# Patient Record
Sex: Male | Born: 1940 | Race: White | Hispanic: No | Marital: Single | Smoking: Former smoker
Health system: Southern US, Community
[De-identification: ages and names within clinical notes are randomized; demographics above are authoritative.]

## PROBLEM LIST (undated history)

## (undated) DIAGNOSIS — K7689 Other specified diseases of liver: Secondary | ICD-10-CM

## (undated) DIAGNOSIS — D126 Benign neoplasm of colon, unspecified: Secondary | ICD-10-CM

## (undated) DIAGNOSIS — C61 Malignant neoplasm of prostate: Secondary | ICD-10-CM

## (undated) DIAGNOSIS — Z8619 Personal history of other infectious and parasitic diseases: Secondary | ICD-10-CM

## (undated) DIAGNOSIS — K8681 Exocrine pancreatic insufficiency: Secondary | ICD-10-CM

## (undated) DIAGNOSIS — E785 Hyperlipidemia, unspecified: Secondary | ICD-10-CM

## (undated) DIAGNOSIS — I2510 Atherosclerotic heart disease of native coronary artery without angina pectoris: Secondary | ICD-10-CM

## (undated) DIAGNOSIS — K2270 Barrett's esophagus without dysplasia: Secondary | ICD-10-CM

## (undated) DIAGNOSIS — A048 Other specified bacterial intestinal infections: Secondary | ICD-10-CM

## (undated) DIAGNOSIS — R490 Dysphonia: Secondary | ICD-10-CM

## (undated) DIAGNOSIS — H25019 Cortical age-related cataract, unspecified eye: Secondary | ICD-10-CM

## (undated) DIAGNOSIS — I509 Heart failure, unspecified: Secondary | ICD-10-CM

## (undated) DIAGNOSIS — I480 Paroxysmal atrial fibrillation: Secondary | ICD-10-CM

## (undated) DIAGNOSIS — D649 Anemia, unspecified: Secondary | ICD-10-CM

## (undated) DIAGNOSIS — I714 Abdominal aortic aneurysm, without rupture: Secondary | ICD-10-CM

## (undated) DIAGNOSIS — I10 Essential (primary) hypertension: Secondary | ICD-10-CM

## (undated) HISTORY — DX: Exocrine pancreatic insufficiency: K86.81

## (undated) HISTORY — DX: Other specified diseases of liver: K76.89

## (undated) HISTORY — DX: Heart failure, unspecified: I50.9

## (undated) HISTORY — DX: Cortical age-related cataract, unspecified eye: H25.019

## (undated) HISTORY — PX: CATARACT EXTRACTION: SUR2

## (undated) HISTORY — DX: Malignant neoplasm of prostate: C61

## (undated) HISTORY — PX: ESOPHAGOGASTRODUODENOSCOPY: SHX1529

## (undated) HISTORY — DX: Abdominal aortic aneurysm, without rupture: I71.4

## (undated) HISTORY — PX: INSERTION OF ICD: SHX6689

## (undated) HISTORY — DX: Hyperlipidemia, unspecified: E78.5

## (undated) HISTORY — DX: Benign neoplasm of colon, unspecified: D12.6

## (undated) HISTORY — DX: Atherosclerotic heart disease of native coronary artery without angina pectoris: I25.10

## (undated) HISTORY — DX: Dysphonia: R49.0

## (undated) HISTORY — DX: Anemia, unspecified: D64.9

## (undated) HISTORY — DX: Other specified bacterial intestinal infections: A04.8

## (undated) HISTORY — PX: PROSTATE SURGERY: SHX751

## (undated) HISTORY — PX: COLONOSCOPY: SHX174

## (undated) HISTORY — PX: TONSILLECTOMY: SUR1361

## (undated) HISTORY — DX: Barrett's esophagus without dysplasia: K22.70

## (undated) HISTORY — DX: Personal history of other infectious and parasitic diseases: Z86.19

---

## 1990-08-26 HISTORY — PX: FLEXIBLE SIGMOIDOSCOPY: SHX1649

## 2004-09-18 ENCOUNTER — Ambulatory Visit: Payer: Self-pay | Admitting: Unknown Physician Specialty

## 2008-10-16 ENCOUNTER — Inpatient Hospital Stay: Payer: Self-pay | Admitting: *Deleted

## 2009-03-14 ENCOUNTER — Ambulatory Visit: Payer: Self-pay | Admitting: Gastroenterology

## 2014-10-06 ENCOUNTER — Ambulatory Visit
Admit: 2014-10-06 | Disposition: A | Discharge status: Home / Self Care | Payer: Self-pay | Attending: Unknown Physician Specialty | Admitting: Unknown Physician Specialty

## 2014-10-10 LAB — SURGICAL PATHOLOGY

## 2015-05-01 ENCOUNTER — Other Ambulatory Visit: Payer: Self-pay | Admitting: Physician Assistant

## 2015-05-01 DIAGNOSIS — R194 Change in bowel habit: Secondary | ICD-10-CM

## 2015-05-05 ENCOUNTER — Other Ambulatory Visit: Payer: Self-pay | Admitting: Physician Assistant

## 2015-05-05 DIAGNOSIS — K8681 Exocrine pancreatic insufficiency: Secondary | ICD-10-CM

## 2015-05-09 ENCOUNTER — Ambulatory Visit
Admission: RE | Admit: 2015-05-09 | Discharge: 2015-05-09 | Disposition: A | Discharge status: Home / Self Care | Payer: Medicare Other | Source: Ambulatory Visit | Attending: Physician Assistant | Admitting: Physician Assistant

## 2015-05-09 ENCOUNTER — Ambulatory Visit: Payer: Medicare Other

## 2015-05-09 DIAGNOSIS — K8681 Exocrine pancreatic insufficiency: Secondary | ICD-10-CM

## 2015-05-10 ENCOUNTER — Other Ambulatory Visit: Payer: Self-pay | Admitting: Physician Assistant

## 2015-05-10 DIAGNOSIS — K8681 Exocrine pancreatic insufficiency: Secondary | ICD-10-CM

## 2015-05-13 DIAGNOSIS — I714 Abdominal aortic aneurysm, without rupture, unspecified: Secondary | ICD-10-CM

## 2015-05-13 HISTORY — DX: Abdominal aortic aneurysm, without rupture: I71.4

## 2015-05-13 HISTORY — DX: Abdominal aortic aneurysm, without rupture, unspecified: I71.40

## 2015-05-16 ENCOUNTER — Ambulatory Visit
Admission: RE | Admit: 2015-05-16 | Discharge: 2015-05-16 | Disposition: A | Discharge status: Home / Self Care | Payer: Medicare Other | Source: Ambulatory Visit | Attending: Physician Assistant | Admitting: Physician Assistant

## 2015-05-16 DIAGNOSIS — K8681 Exocrine pancreatic insufficiency: Secondary | ICD-10-CM | POA: Insufficient documentation

## 2015-05-16 DIAGNOSIS — K769 Liver disease, unspecified: Secondary | ICD-10-CM | POA: Diagnosis not present

## 2015-05-16 DIAGNOSIS — I714 Abdominal aortic aneurysm, without rupture: Secondary | ICD-10-CM | POA: Diagnosis not present

## 2015-05-16 DIAGNOSIS — K7689 Other specified diseases of liver: Secondary | ICD-10-CM

## 2015-05-16 HISTORY — DX: Other specified diseases of liver: K76.89

## 2015-05-16 HISTORY — DX: Essential (primary) hypertension: I10

## 2015-05-16 MED ORDER — IOHEXOL 350 MG/ML SOLN
100.0000 mL | Freq: Once | INTRAVENOUS | Status: AC | PRN
  Administered 2015-05-16: 80 mL via INTRAVENOUS

## 2015-08-08 ENCOUNTER — Encounter: Payer: Self-pay | Admitting: *Deleted

## 2015-08-08 ENCOUNTER — Inpatient Hospital Stay: Payer: Medicare Other | Attending: Internal Medicine | Admitting: Internal Medicine

## 2015-08-08 ENCOUNTER — Inpatient Hospital Stay: Payer: Medicare Other

## 2015-08-08 VITALS — BP 151/72 | HR 51 | Temp 96.0°F | Resp 19 | Wt 176.4 lb

## 2015-08-08 DIAGNOSIS — Z7982 Long term (current) use of aspirin: Secondary | ICD-10-CM | POA: Insufficient documentation

## 2015-08-08 DIAGNOSIS — Z8546 Personal history of malignant neoplasm of prostate: Secondary | ICD-10-CM | POA: Diagnosis not present

## 2015-08-08 DIAGNOSIS — Z79899 Other long term (current) drug therapy: Secondary | ICD-10-CM | POA: Diagnosis not present

## 2015-08-08 DIAGNOSIS — I2510 Atherosclerotic heart disease of native coronary artery without angina pectoris: Secondary | ICD-10-CM

## 2015-08-08 DIAGNOSIS — F17210 Nicotine dependence, cigarettes, uncomplicated: Secondary | ICD-10-CM | POA: Insufficient documentation

## 2015-08-08 DIAGNOSIS — E785 Hyperlipidemia, unspecified: Secondary | ICD-10-CM

## 2015-08-08 DIAGNOSIS — K2270 Barrett's esophagus without dysplasia: Secondary | ICD-10-CM | POA: Diagnosis not present

## 2015-08-08 DIAGNOSIS — Z86010 Personal history of colonic polyps: Secondary | ICD-10-CM | POA: Insufficient documentation

## 2015-08-08 DIAGNOSIS — K8681 Exocrine pancreatic insufficiency: Secondary | ICD-10-CM | POA: Insufficient documentation

## 2015-08-08 DIAGNOSIS — I509 Heart failure, unspecified: Secondary | ICD-10-CM | POA: Diagnosis not present

## 2015-08-08 DIAGNOSIS — D649 Anemia, unspecified: Secondary | ICD-10-CM

## 2015-08-08 DIAGNOSIS — I10 Essential (primary) hypertension: Secondary | ICD-10-CM | POA: Diagnosis not present

## 2015-08-08 DIAGNOSIS — R490 Dysphonia: Secondary | ICD-10-CM | POA: Diagnosis not present

## 2015-08-08 DIAGNOSIS — I714 Abdominal aortic aneurysm, without rupture: Secondary | ICD-10-CM | POA: Insufficient documentation

## 2015-08-08 LAB — COMPREHENSIVE METABOLIC PANEL
ALT: 27 U/L (ref 17–63)
AST: 24 U/L (ref 15–41)
Albumin: 4.6 g/dL (ref 3.5–5.0)
Alkaline Phosphatase: 67 U/L (ref 38–126)
Anion gap: 6 (ref 5–15)
BILIRUBIN TOTAL: 0.5 mg/dL (ref 0.3–1.2)
BUN: 19 mg/dL (ref 6–20)
CO2: 28 mmol/L (ref 22–32)
Calcium: 9.2 mg/dL (ref 8.9–10.3)
Chloride: 105 mmol/L (ref 101–111)
Creatinine, Ser: 1.98 mg/dL — ABNORMAL HIGH (ref 0.61–1.24)
GFR calc Af Amer: 37 mL/min — ABNORMAL LOW (ref >60)
GFR, EST NON AFRICAN AMERICAN: 32 mL/min — AB (ref >60)
Glucose, Bld: 99 mg/dL (ref 65–99)
Potassium: 4.1 mmol/L (ref 3.5–5.1)
Sodium: 139 mmol/L (ref 135–145)
TOTAL PROTEIN: 7.1 g/dL (ref 6.5–8.1)

## 2015-08-08 LAB — CBC WITH DIFFERENTIAL/PLATELET
Basophils Absolute: 0 10*3/uL (ref 0–0.1)
Basophils Relative: 1 %
Eosinophils Absolute: 0.2 10*3/uL (ref 0–0.7)
Eosinophils Relative: 3 %
HEMATOCRIT: 34.6 % — AB (ref 40.0–52.0)
Hemoglobin: 11.7 g/dL — ABNORMAL LOW (ref 13.0–18.0)
LYMPHS ABS: 1.4 10*3/uL (ref 1.0–3.6)
Lymphocytes Relative: 24 %
MCH: 29.3 pg (ref 26.0–34.0)
MCHC: 33.7 g/dL (ref 32.0–36.0)
MCV: 87 fL (ref 80.0–100.0)
MONO ABS: 0.5 10*3/uL (ref 0.2–1.0)
Neutro Abs: 3.6 10*3/uL (ref 1.4–6.5)
Neutrophils Relative %: 64 %
Platelets: 159 10*3/uL (ref 150–440)
RBC: 3.97 MIL/uL — ABNORMAL LOW (ref 4.40–5.90)
RDW: 13.4 % (ref 11.5–14.5)
WBC: 5.7 10*3/uL (ref 3.8–10.6)

## 2015-08-08 LAB — RETICULOCYTES
RBC.: 3.96 MIL/uL — ABNORMAL LOW (ref 4.40–5.90)
RETIC COUNT ABSOLUTE: 47.5 10*3/uL (ref 19.0–183.0)
Retic Ct Pct: 1.2 % (ref 0.4–3.1)

## 2015-08-08 LAB — FERRITIN: Ferritin: 60 ng/mL (ref 24–336)

## 2015-08-08 LAB — IRON AND TIBC
IRON: 91 ug/dL (ref 45–182)
Saturation Ratios: 28 % (ref 17.9–39.5)
TIBC: 326 ug/dL (ref 250–450)
UIBC: 235 ug/dL

## 2015-08-08 LAB — LACTATE DEHYDROGENASE: LDH: 255 U/L — ABNORMAL HIGH (ref 98–192)

## 2015-08-08 NOTE — Progress Notes (Signed)
New eval of mild anemia. Feels well. Denies blood in stool. Last colonoscopy in 2016. Hx cardiomyopathy, Defib placement 2009. Hx Prostate Ca with protectomy in 1996. Reports energy is good. Appetite is good.

## 2015-08-08 NOTE — Progress Notes (Signed)
[facility] CONSULT NOTE  Patient Care Team: Maryland Pink, MD as PCP - General (Family Medicine)  CHIEF COMPLAINTS/PURPOSE OF CONSULTATION: Anemia  # NOV 2016 Mild Anemia ~11.9  # hx of Prostate cancer s/p Prostatectomy [1996]  HISTORY OF PRESENTING ILLNESS:  Matthew Zamora 75 y.o.  male pleasant patient has been referred to [place] for further evaluation of his mild anemia. On the review of CBC from November 2016 patient's hemoglobin was 11.9; normocytic. White count and platelets were normal.  Patient denies any unusual fatigue. Denies any shortness of breath or chest pain. Patient denies any blood in stools black stools. Patient had a EGD colonoscopy in April 2016.   Patient denies any unusual weight loss or night sweats or loss of appetite.  ROS: A complete 10 point review of system is done which is negative except mentioned above in history of present illness  MEDICAL HISTORY:  Past Medical History  Diagnosis Date  . Prostate CA ([place])   . Hypertension   . Anemia   . 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 Chronic hoarseness   . Cataract cortical, senile   . CAD (coronary artery disease)   . CHF (congestive heart failure) ([place])   . H. pylori infection   . History of hepatitis   . Hyperlipidemia   . 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 Abdominal aortic aneurysm (AAA) ([place]) 05/13/15    seen on ct scan    SURGICAL HISTORY: Past Surgical History  Procedure Laterality Date  . Prostate surgery    . Insertion of icd    . Cataract extraction    . Tonsillectomy    . Colonoscopy  10/06/2014, 09/18/2004, 03/14/2009  . Flexible sigmoidoscopy  08/26/1990  . Esophagogastroduodenoscopy  10/06/2014, 03/18/2001, 03/14/2009    SOCIAL HISTORY: Social History   Social History  . Marital Status: Single    Spouse Name: N/A  . Number of Children: N/A  . Years of Education: N/A    Occupational History  . Not on file.   Social History Main Topics  . Smoking status: Former Smoker -- 1.00 packs/day for 38 years    Types: Cigarettes    Quit date: 06/18/1999  . Smokeless tobacco: Never Used  . Alcohol Use: No  . Drug Use: Not on file  . Sexual Activity: Not on file   Other Topics Concern  . Not on file   Social History Narrative    FAMILY HISTORY: Family History  Problem Relation Age of Onset  . Heart attack Mother   . Heart attack Father     ALLERGIES:  has no allergies on file.  MEDICATIONS:  Current Outpatient Prescriptions  Medication Sig Dispense Refill  . aspirin EC 81 MG tablet Take 81 mg by mouth daily.    [name] atorvastatin (LIPITOR) 40 MG tablet Take 40 mg by mouth daily.    . furosemide (LASIX) 40 MG tablet Take 20 mg by mouth daily.    . lipase/protease/amylase (CREON) 12000 units CPEP capsule Take 24,000 Units by mouth 2 (two) times daily at 10 am and 4 pm.    . lisinopril (PRINIVIL,ZESTRIL) 5 MG tablet Take 5 mg by mouth daily.    . magnesium (MAGTAB) 84 MG (7MEQ) TBCR SR tablet Take 84 mg by mouth daily.    . metoprolol succinate (TOPROL-XL) 100 MG 24 hr tablet Take 100 mg by mouth daily. Take with or immediately following  a meal.    . Multiple Vitamins-Minerals (MULTIVITAMIN WITH MINERALS) tablet Take 1 tablet by mouth daily.    [name] omeprazole (PRILOSEC) 20 MG capsule Take 20 mg by mouth daily.    [name] oxybutynin (DITROPAN XL) 15 MG 24 hr tablet Take 15 mg by mouth at bedtime.     No current facility-administered medications for this visit.      [name]  PHYSICAL EXAMINATION:   Filed Vitals:   08/08/15 1021  BP: 151/72  Pulse: 51  Temp: 96 F (35.6 C)  Resp: 19   Filed Weights   08/08/15 1021  Weight: 176 lb 5.9 oz (80 kg)    GENERAL: Well-nourished well-developed; Alert, no distress and comfortable.  Alone.  EYES: no pallor or icterus OROPHARYNX: no thrush or ulceration; dentures.   NECK: supple, no masses felt LYMPH:  no  palpable lymphadenopathy in the cervical, axillary or inguinal regions LUNGS: clear to auscultation and  No wheeze or crackles HEART/CVS: regular rate & rhythm and no murmurs; No lower extremity edema ABDOMEN: abdomen soft, non-tender and normal bowel sounds Musculoskeletal:no cyanosis of digits and no clubbing  PSYCH: alert & oriented x 3 with fluent speech NEURO: no focal motor/sensory deficits SKIN:  no rashes or significant lesions   ASSESSMENT & PLAN:   # Anemia- normocytic; 11.9- November 2016. Patient is asymptomatic at this time. The reason for the patient's mild anemia is unclear. I would recommend checking a CBC CMP and LDH and haptoglobin iron studies; monoclonal workup.  # The above plan of care was discussed with the patient he agrees. He'll follow-up with me in approximately 2 weeks/to review the above blood work-results.  Thank you Dr. Gorden Harms for allowing me to participate in the care of your pleasant patient. Please do not hesitate to contact me with questions or concerns in the interim.  # 30 minutes face-to-face with the patient discussing the above plan of care; more than 50% of time spent on counseling and coordination.     Cammie Sickle, MD 08/08/2015 10:26 AM

## 2015-08-09 LAB — IMMUNOFIXATION ELECTROPHORESIS
IGM, SERUM: 108 mg/dL (ref 15–143)
IgA: 80 mg/dL (ref 61–437)
IgG (Immunoglobin G), Serum: 800 mg/dL (ref 700–1600)
Total Protein ELP: 6.7 g/dL (ref 6.0–8.5)

## 2015-08-09 LAB — KAPPA/LAMBDA LIGHT CHAINS
KAPPA FREE LGHT CHN: 32.2 mg/L — AB (ref 3.30–19.40)
Kappa, lambda light chain ratio: 2.01 — ABNORMAL HIGH (ref 0.26–1.65)
LAMDA FREE LIGHT CHAINS: 16.05 mg/L (ref 5.71–26.30)

## 2015-08-09 LAB — SOLUBLE TRANSFERRIN RECEPTOR: Transferrin Receptor: 16.3 nmol/L (ref 12.2–27.3)

## 2015-08-09 LAB — HAPTOGLOBIN: Haptoglobin: 82 mg/dL (ref 34–200)

## 2015-08-22 ENCOUNTER — Inpatient Hospital Stay: Payer: Medicare Other | Attending: Internal Medicine | Admitting: Internal Medicine

## 2015-08-22 VITALS — BP 153/75 | Temp 96.8°F | Resp 19 | Wt 178.6 lb

## 2015-08-22 DIAGNOSIS — I2510 Atherosclerotic heart disease of native coronary artery without angina pectoris: Secondary | ICD-10-CM

## 2015-08-22 DIAGNOSIS — K8681 Exocrine pancreatic insufficiency: Secondary | ICD-10-CM | POA: Diagnosis not present

## 2015-08-22 DIAGNOSIS — E785 Hyperlipidemia, unspecified: Secondary | ICD-10-CM | POA: Insufficient documentation

## 2015-08-22 DIAGNOSIS — R490 Dysphonia: Secondary | ICD-10-CM | POA: Insufficient documentation

## 2015-08-22 DIAGNOSIS — D649 Anemia, unspecified: Secondary | ICD-10-CM | POA: Diagnosis not present

## 2015-08-22 DIAGNOSIS — Z79899 Other long term (current) drug therapy: Secondary | ICD-10-CM | POA: Insufficient documentation

## 2015-08-22 DIAGNOSIS — K2270 Barrett's esophagus without dysplasia: Secondary | ICD-10-CM

## 2015-08-22 DIAGNOSIS — I129 Hypertensive chronic kidney disease with stage 1 through stage 4 chronic kidney disease, or unspecified chronic kidney disease: Secondary | ICD-10-CM | POA: Diagnosis not present

## 2015-08-22 DIAGNOSIS — Z8619 Personal history of other infectious and parasitic diseases: Secondary | ICD-10-CM | POA: Diagnosis not present

## 2015-08-22 DIAGNOSIS — K769 Liver disease, unspecified: Secondary | ICD-10-CM | POA: Diagnosis not present

## 2015-08-22 DIAGNOSIS — I714 Abdominal aortic aneurysm, without rupture: Secondary | ICD-10-CM | POA: Insufficient documentation

## 2015-08-22 DIAGNOSIS — Z86010 Personal history of colonic polyps: Secondary | ICD-10-CM | POA: Diagnosis not present

## 2015-08-22 DIAGNOSIS — C61 Malignant neoplasm of prostate: Secondary | ICD-10-CM

## 2015-08-22 DIAGNOSIS — Z7982 Long term (current) use of aspirin: Secondary | ICD-10-CM | POA: Insufficient documentation

## 2015-08-22 DIAGNOSIS — N189 Chronic kidney disease, unspecified: Secondary | ICD-10-CM | POA: Diagnosis not present

## 2015-08-22 DIAGNOSIS — I509 Heart failure, unspecified: Secondary | ICD-10-CM | POA: Insufficient documentation

## 2015-08-22 DIAGNOSIS — D6489 Other specified anemias: Secondary | ICD-10-CM

## 2015-08-22 DIAGNOSIS — Z87891 Personal history of nicotine dependence: Secondary | ICD-10-CM | POA: Insufficient documentation

## 2015-08-22 NOTE — Progress Notes (Signed)
[facility] NOTE  Patient Care Team: Maryland Pink, MD as PCP - General (Family Medicine)  CHIEF COMPLAINTS/PURPOSE OF CONSULTATION: Anemia  # NOV 2016 Mild Anemia ~11.9 [Ferritin-60/ % sat- 28; transferin-N; SIEP- NEG; K/L= 2; EGD/colo- NEG April 2016].   # hx of Prostate cancer s/p Prostatectomy [1996]  HISTORY OF PRESENTING ILLNESS:  Matthew Zamora 75 y.o.  male pleasant patient  Is here to review the results of this blood work for his anemia.  Denies any unusual fatigue.  On further question today he admits to taking about 3  Advil  At night;  To help sleep.  Patient denies any unusual weight loss or night sweats or loss of appetite.  ROS: A complete 10 point review of system is done which is negative except mentioned above in history of present illness  MEDICAL HISTORY:  Past Medical History  Diagnosis Date  . Prostate CA ([place])   . Hypertension   . Anemia   . 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 Chronic hoarseness   . Cataract cortical, senile   . CAD (coronary artery disease)   . CHF (congestive heart failure) ([place])   . H. pylori infection   . History of hepatitis   . Hyperlipidemia   . 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 Abdominal aortic aneurysm (AAA) ([place]) 05/13/15    seen on ct scan    SURGICAL HISTORY: Past Surgical History  Procedure Laterality Date  . Prostate surgery    . Insertion of icd    . Cataract extraction    . Tonsillectomy    . Colonoscopy  10/06/2014, 09/18/2004, 03/14/2009  . Flexible sigmoidoscopy  08/26/1990  . Esophagogastroduodenoscopy  10/06/2014, 03/18/2001, 03/14/2009    SOCIAL HISTORY: Social History   Social History  . Marital Status: Single    Spouse Name: N/A  . Number of Children: N/A  . Years of Education: N/A   Occupational History  . Not on file.   Social History Main Topics  . Smoking status: Former  Smoker -- 1.00 packs/day for 38 years    Types: Cigarettes    Quit date: 06/18/1999  . Smokeless tobacco: Never Used  . Alcohol Use: No  . Drug Use: Not on file  . Sexual Activity: Not on file   Other Topics Concern  . Not on file   Social History Narrative    FAMILY HISTORY: Family History  Problem Relation Age of Onset  . Heart attack Mother   . Heart attack Father     ALLERGIES:  has no allergies on file.  MEDICATIONS:  Current Outpatient Prescriptions  Medication Sig Dispense Refill  . aspirin EC 81 MG tablet Take 81 mg by mouth daily.    [name] atorvastatin (LIPITOR) 40 MG tablet Take 40 mg by mouth daily.    . furosemide (LASIX) 40 MG tablet Take 20 mg by mouth daily.    . lipase/protease/amylase (CREON) 12000 units CPEP capsule Take 24,000 Units by mouth 2 (two) times daily at 10 am and 4 pm.    . lisinopril (PRINIVIL,ZESTRIL) 5 MG tablet Take 5 mg by mouth daily.    . magnesium (MAGTAB) 84 MG (7MEQ) TBCR SR tablet Take 84 mg by mouth daily.    . metoprolol succinate (TOPROL-XL) 100 MG 24 hr tablet Take 100 mg by mouth daily. Take with or immediately following a meal.    . Multiple Vitamins-Minerals (MULTIVITAMIN  WITH MINERALS) tablet Take 1 tablet by mouth daily.    [name] omeprazole (PRILOSEC) 20 MG capsule Take 20 mg by mouth daily.    [name] oxybutynin (DITROPAN XL) 15 MG 24 hr tablet Take 15 mg by mouth at bedtime.     No current facility-administered medications for this visit.      [name]  PHYSICAL EXAMINATION:   Filed Vitals:   08/22/15 0921  BP: 153/75  Temp: 96.8 F (36 C)  Resp: 19   Filed Weights   08/22/15 0921  Weight: 178 lb 9.2 oz (81 kg)    GENERAL: Well-nourished well-developed; Alert, no distress and comfortable.  Alone.    ASSESSMENT & PLAN:   # Anemia- normocytic; 11.9- November 2016. Patient is asymptomatic at this time. anemia  Question related to chronic renal insufficiency.   Blood work not suggestive of iron deficiency; no hemolysis [ mild   LDH elevation;  Haptoglobin normal].  Monoclonal workup negative- except for slightly abnormal Kappa lambda light chain ratio [~2- likely from CKD] Would not recommend a bone marrow biopsy at this time.  Monitor for now.  #  Chronic kidney disease-  Creatinine  1.98;  Question related to chronic NSAID use.  Recommend stopping OF NSAIDs.  Recommended talks to  PCP regarding evaluation with nephrology.   Written instructions given.   #  History of prostate cancer status post prostatectomy-[ 3295]- check PSA at next visit.   #  Patient will follow-up with me in approximately 6 months/ labs/ PSA.  # 15 minutes face-to-face with the patient discussing the above plan of care; more than 50% of time spent on counseling and coordination.     Cammie Sickle, MD 08/22/2015 9:31 AM

## 2015-08-22 NOTE — Progress Notes (Signed)
Pt here to F/U on initial blood work up for Anemia. He is irate over bill that he received from our office. He states this is his last vist here , he will be taking his care elsewhere. Denies dizziness. States he feels good. Has exertional dyspnea from his cardiac issues.2009 Defib placed. No edema.Pt walks every day for exercise. Has chronic spurs in his back that rarely bothers him.

## 2016-02-27 ENCOUNTER — Inpatient Hospital Stay: Payer: Medicare Other | Attending: Family Medicine

## 2016-02-27 ENCOUNTER — Inpatient Hospital Stay: Payer: Medicare Other | Admitting: Internal Medicine

## 2018-04-26 ENCOUNTER — Emergency Department: Payer: Medicare Other

## 2018-04-26 ENCOUNTER — Inpatient Hospital Stay: Payer: Self-pay

## 2018-04-26 ENCOUNTER — Inpatient Hospital Stay (HOSPITAL_COMMUNITY)
Admit: 2018-04-26 | Discharge: 2018-04-26 | Disposition: A | Discharge status: Home / Self Care | Payer: Medicare Other | Attending: Internal Medicine | Admitting: Internal Medicine

## 2018-04-26 ENCOUNTER — Inpatient Hospital Stay
Admission: EM | Admit: 2018-04-26 | Discharge: 2018-05-13 | DRG: 870 | Disposition: A | Discharge status: Skilled Nursing Facility | Payer: Medicare Other | Attending: Internal Medicine | Admitting: Internal Medicine

## 2018-04-26 ENCOUNTER — Encounter: Payer: Self-pay | Admitting: *Deleted

## 2018-04-26 ENCOUNTER — Other Ambulatory Visit: Payer: Self-pay

## 2018-04-26 DIAGNOSIS — Z9849 Cataract extraction status, unspecified eye: Secondary | ICD-10-CM

## 2018-04-26 DIAGNOSIS — E785 Hyperlipidemia, unspecified: Secondary | ICD-10-CM | POA: Diagnosis present

## 2018-04-26 DIAGNOSIS — I2510 Atherosclerotic heart disease of native coronary artery without angina pectoris: Secondary | ICD-10-CM | POA: Diagnosis present

## 2018-04-26 DIAGNOSIS — I5043 Acute on chronic combined systolic (congestive) and diastolic (congestive) heart failure: Secondary | ICD-10-CM | POA: Diagnosis present

## 2018-04-26 DIAGNOSIS — G9340 Encephalopathy, unspecified: Secondary | ICD-10-CM | POA: Diagnosis not present

## 2018-04-26 DIAGNOSIS — R140 Abdominal distension (gaseous): Secondary | ICD-10-CM

## 2018-04-26 DIAGNOSIS — R6521 Severe sepsis with septic shock: Secondary | ICD-10-CM | POA: Diagnosis present

## 2018-04-26 DIAGNOSIS — Z79899 Other long term (current) drug therapy: Secondary | ICD-10-CM | POA: Diagnosis not present

## 2018-04-26 DIAGNOSIS — E1122 Type 2 diabetes mellitus with diabetic chronic kidney disease: Secondary | ICD-10-CM | POA: Diagnosis present

## 2018-04-26 DIAGNOSIS — Z0189 Encounter for other specified special examinations: Secondary | ICD-10-CM

## 2018-04-26 DIAGNOSIS — D631 Anemia in chronic kidney disease: Secondary | ICD-10-CM | POA: Diagnosis present

## 2018-04-26 DIAGNOSIS — Z01818 Encounter for other preprocedural examination: Secondary | ICD-10-CM

## 2018-04-26 DIAGNOSIS — G9341 Metabolic encephalopathy: Secondary | ICD-10-CM | POA: Diagnosis not present

## 2018-04-26 DIAGNOSIS — E876 Hypokalemia: Secondary | ICD-10-CM | POA: Diagnosis not present

## 2018-04-26 DIAGNOSIS — D649 Anemia, unspecified: Secondary | ICD-10-CM | POA: Diagnosis not present

## 2018-04-26 DIAGNOSIS — I5021 Acute systolic (congestive) heart failure: Secondary | ICD-10-CM | POA: Diagnosis not present

## 2018-04-26 DIAGNOSIS — I248 Other forms of acute ischemic heart disease: Secondary | ICD-10-CM | POA: Diagnosis not present

## 2018-04-26 DIAGNOSIS — A419 Sepsis, unspecified organism: Secondary | ICD-10-CM | POA: Diagnosis present

## 2018-04-26 DIAGNOSIS — R0602 Shortness of breath: Secondary | ICD-10-CM

## 2018-04-26 DIAGNOSIS — J9621 Acute and chronic respiratory failure with hypoxia: Secondary | ICD-10-CM | POA: Diagnosis present

## 2018-04-26 DIAGNOSIS — N179 Acute kidney failure, unspecified: Secondary | ICD-10-CM | POA: Diagnosis not present

## 2018-04-26 DIAGNOSIS — I130 Hypertensive heart and chronic kidney disease with heart failure and stage 1 through stage 4 chronic kidney disease, or unspecified chronic kidney disease: Secondary | ICD-10-CM | POA: Diagnosis present

## 2018-04-26 DIAGNOSIS — Z7982 Long term (current) use of aspirin: Secondary | ICD-10-CM

## 2018-04-26 DIAGNOSIS — Z7902 Long term (current) use of antithrombotics/antiplatelets: Secondary | ICD-10-CM

## 2018-04-26 DIAGNOSIS — N183 Chronic kidney disease, stage 3 (moderate): Secondary | ICD-10-CM | POA: Diagnosis present

## 2018-04-26 DIAGNOSIS — Z8546 Personal history of malignant neoplasm of prostate: Secondary | ICD-10-CM | POA: Diagnosis not present

## 2018-04-26 DIAGNOSIS — N189 Chronic kidney disease, unspecified: Secondary | ICD-10-CM | POA: Diagnosis not present

## 2018-04-26 DIAGNOSIS — Z7189 Other specified counseling: Secondary | ICD-10-CM | POA: Diagnosis not present

## 2018-04-26 DIAGNOSIS — Z9911 Dependence on respirator [ventilator] status: Secondary | ICD-10-CM | POA: Diagnosis not present

## 2018-04-26 DIAGNOSIS — J9600 Acute respiratory failure, unspecified whether with hypoxia or hypercapnia: Secondary | ICD-10-CM | POA: Diagnosis present

## 2018-04-26 DIAGNOSIS — Z95810 Presence of automatic (implantable) cardiac defibrillator: Secondary | ICD-10-CM

## 2018-04-26 DIAGNOSIS — Z955 Presence of coronary angioplasty implant and graft: Secondary | ICD-10-CM

## 2018-04-26 DIAGNOSIS — I5022 Chronic systolic (congestive) heart failure: Secondary | ICD-10-CM | POA: Diagnosis not present

## 2018-04-26 DIAGNOSIS — K8681 Exocrine pancreatic insufficiency: Secondary | ICD-10-CM | POA: Diagnosis present

## 2018-04-26 DIAGNOSIS — J189 Pneumonia, unspecified organism: Secondary | ICD-10-CM | POA: Diagnosis present

## 2018-04-26 DIAGNOSIS — I480 Paroxysmal atrial fibrillation: Secondary | ICD-10-CM | POA: Diagnosis not present

## 2018-04-26 DIAGNOSIS — R579 Shock, unspecified: Secondary | ICD-10-CM | POA: Diagnosis not present

## 2018-04-26 DIAGNOSIS — R0603 Acute respiratory distress: Secondary | ICD-10-CM | POA: Diagnosis present

## 2018-04-26 DIAGNOSIS — J810 Acute pulmonary edema: Secondary | ICD-10-CM | POA: Diagnosis not present

## 2018-04-26 DIAGNOSIS — I340 Nonrheumatic mitral (valve) insufficiency: Secondary | ICD-10-CM | POA: Diagnosis not present

## 2018-04-26 DIAGNOSIS — I5023 Acute on chronic systolic (congestive) heart failure: Secondary | ICD-10-CM | POA: Diagnosis not present

## 2018-04-26 DIAGNOSIS — N2581 Secondary hyperparathyroidism of renal origin: Secondary | ICD-10-CM | POA: Diagnosis present

## 2018-04-26 DIAGNOSIS — I714 Abdominal aortic aneurysm, without rupture: Secondary | ICD-10-CM | POA: Diagnosis present

## 2018-04-26 DIAGNOSIS — Z87891 Personal history of nicotine dependence: Secondary | ICD-10-CM

## 2018-04-26 DIAGNOSIS — I2541 Coronary artery aneurysm: Secondary | ICD-10-CM | POA: Diagnosis present

## 2018-04-26 DIAGNOSIS — K449 Diaphragmatic hernia without obstruction or gangrene: Secondary | ICD-10-CM | POA: Diagnosis present

## 2018-04-26 DIAGNOSIS — J9601 Acute respiratory failure with hypoxia: Secondary | ICD-10-CM | POA: Diagnosis present

## 2018-04-26 DIAGNOSIS — Z8249 Family history of ischemic heart disease and other diseases of the circulatory system: Secondary | ICD-10-CM

## 2018-04-26 DIAGNOSIS — N170 Acute kidney failure with tubular necrosis: Secondary | ICD-10-CM | POA: Diagnosis present

## 2018-04-26 DIAGNOSIS — Z86010 Personal history of colonic polyps: Secondary | ICD-10-CM | POA: Diagnosis not present

## 2018-04-26 DIAGNOSIS — R680 Hypothermia, not associated with low environmental temperature: Secondary | ICD-10-CM | POA: Diagnosis present

## 2018-04-26 DIAGNOSIS — F40240 Claustrophobia: Secondary | ICD-10-CM | POA: Diagnosis present

## 2018-04-26 DIAGNOSIS — E874 Mixed disorder of acid-base balance: Secondary | ICD-10-CM | POA: Diagnosis present

## 2018-04-26 DIAGNOSIS — Z8719 Personal history of other diseases of the digestive system: Secondary | ICD-10-CM

## 2018-04-26 DIAGNOSIS — J9602 Acute respiratory failure with hypercapnia: Secondary | ICD-10-CM | POA: Diagnosis present

## 2018-04-26 DIAGNOSIS — R4182 Altered mental status, unspecified: Secondary | ICD-10-CM | POA: Diagnosis not present

## 2018-04-26 DIAGNOSIS — J9690 Respiratory failure, unspecified, unspecified whether with hypoxia or hypercapnia: Secondary | ICD-10-CM

## 2018-04-26 DIAGNOSIS — D638 Anemia in other chronic diseases classified elsewhere: Secondary | ICD-10-CM

## 2018-04-26 DIAGNOSIS — J441 Chronic obstructive pulmonary disease with (acute) exacerbation: Secondary | ICD-10-CM | POA: Diagnosis not present

## 2018-04-26 DIAGNOSIS — Z515 Encounter for palliative care: Secondary | ICD-10-CM | POA: Diagnosis not present

## 2018-04-26 LAB — URINALYSIS, COMPLETE (UACMP) WITH MICROSCOPIC
BILIRUBIN URINE: NEGATIVE
Bacteria, UA: NONE SEEN
Glucose, UA: NEGATIVE mg/dL
HGB URINE DIPSTICK: NEGATIVE
KETONES UR: NEGATIVE mg/dL
LEUKOCYTES UA: NEGATIVE
NITRITE: NEGATIVE
Protein, ur: NEGATIVE mg/dL
Specific Gravity, Urine: 1.008 (ref 1.005–1.030)
Squamous Epithelial / LPF: NONE SEEN (ref 0–5)
WBC UA: NONE SEEN WBC/hpf (ref 0–5)
pH: 6 (ref 5.0–8.0)

## 2018-04-26 LAB — BLOOD GAS, ARTERIAL
Acid-base deficit: 4.1 mmol/L — ABNORMAL HIGH (ref 0.0–2.0)
Acid-base deficit: 2.7 mmol/L — ABNORMAL HIGH (ref 0.0–2.0)
BICARBONATE: 23.7 mmol/L (ref 20.0–28.0)
Bicarbonate: 24.8 mmol/L (ref 20.0–28.0)
FIO2: 0.6
FIO2: 0.6
MECHANICAL RATE: 20
Mechanical Rate: 20
O2 SAT: 99 %
O2 Saturation: 98.3 %
PATIENT TEMPERATURE: 37
PATIENT TEMPERATURE: 37
PCO2 ART: 54 mmHg — AB (ref 32.0–48.0)
PEEP/CPAP: 5 cmH2O
PEEP: 5 cmH2O
PH ART: 7.25 — AB (ref 7.350–7.450)
VT: 450 mL
VT: 400 mL
pCO2 arterial: 54 mmHg — ABNORMAL HIGH (ref 32.0–48.0)
pH, Arterial: 7.27 — ABNORMAL LOW (ref 7.350–7.450)
pO2, Arterial: 126 mmHg — ABNORMAL HIGH (ref 83.0–108.0)
pO2, Arterial: 146 mmHg — ABNORMAL HIGH (ref 83.0–108.0)

## 2018-04-26 LAB — BASIC METABOLIC PANEL
ANION GAP: 9 (ref 5–15)
BUN: 22 mg/dL (ref 8–23)
CALCIUM: 8.3 mg/dL — AB (ref 8.9–10.3)
CHLORIDE: 104 mmol/L (ref 98–111)
CO2: 25 mmol/L (ref 22–32)
Creatinine, Ser: 1.73 mg/dL — ABNORMAL HIGH (ref 0.61–1.24)
GFR calc non Af Amer: 37 mL/min — ABNORMAL LOW (ref >60)
GFR, EST AFRICAN AMERICAN: 42 mL/min — AB (ref >60)
GLUCOSE: 164 mg/dL — AB (ref 70–99)
Potassium: 4.3 mmol/L (ref 3.5–5.1)
Sodium: 138 mmol/L (ref 135–145)

## 2018-04-26 LAB — COMPREHENSIVE METABOLIC PANEL
ALK PHOS: 79 U/L (ref 38–126)
ALT: 16 U/L (ref 0–44)
ANION GAP: 10 (ref 5–15)
AST: 24 U/L (ref 15–41)
Albumin: 4.2 g/dL (ref 3.5–5.0)
BILIRUBIN TOTAL: 0.7 mg/dL (ref 0.3–1.2)
BUN: 21 mg/dL (ref 8–23)
CALCIUM: 8.8 mg/dL — AB (ref 8.9–10.3)
CO2: 23 mmol/L (ref 22–32)
Chloride: 102 mmol/L (ref 98–111)
Creatinine, Ser: 1.85 mg/dL — ABNORMAL HIGH (ref 0.61–1.24)
GFR calc non Af Amer: 34 mL/min — ABNORMAL LOW (ref >60)
GFR, EST AFRICAN AMERICAN: 39 mL/min — AB (ref >60)
GLUCOSE: 200 mg/dL — AB (ref 70–99)
Potassium: 4 mmol/L (ref 3.5–5.1)
Sodium: 135 mmol/L (ref 135–145)
TOTAL PROTEIN: 7.5 g/dL (ref 6.5–8.1)

## 2018-04-26 LAB — CBC WITH DIFFERENTIAL/PLATELET
ABS IMMATURE GRANULOCYTES: 0.08 10*3/uL — AB (ref 0.00–0.07)
BASOS ABS: 0.1 10*3/uL (ref 0.0–0.1)
Basophils Relative: 1 %
Eosinophils Absolute: 0.5 10*3/uL (ref 0.0–0.5)
Eosinophils Relative: 4 %
HCT: 40.5 % (ref 39.0–52.0)
Hemoglobin: 13 g/dL (ref 13.0–17.0)
IMMATURE GRANULOCYTES: 1 %
LYMPHS PCT: 29 %
Lymphs Abs: 3.8 10*3/uL (ref 0.7–4.0)
MCH: 29.3 pg (ref 26.0–34.0)
MCHC: 32.1 g/dL (ref 30.0–36.0)
MCV: 91.4 fL (ref 80.0–100.0)
MONO ABS: 1.1 10*3/uL — AB (ref 0.1–1.0)
Monocytes Relative: 8 %
NEUTROS ABS: 7.7 10*3/uL (ref 1.7–7.7)
NEUTROS PCT: 57 %
NRBC: 0 % (ref 0.0–0.2)
PLATELETS: 237 10*3/uL (ref 150–400)
RBC: 4.43 MIL/uL (ref 4.22–5.81)
RDW: 13.1 % (ref 11.5–15.5)
WBC: 13.2 10*3/uL — AB (ref 4.0–10.5)

## 2018-04-26 LAB — PROCALCITONIN

## 2018-04-26 LAB — URINE DRUG SCREEN, QUALITATIVE (ARMC ONLY)
Amphetamines, Ur Screen: NOT DETECTED
BARBITURATES, UR SCREEN: NOT DETECTED
BENZODIAZEPINE, UR SCRN: NOT DETECTED
CANNABINOID 50 NG, UR ~~LOC~~: NOT DETECTED
Cocaine Metabolite,Ur ~~LOC~~: NOT DETECTED
MDMA (Ecstasy)Ur Screen: NOT DETECTED
METHADONE SCREEN, URINE: NOT DETECTED
Opiate, Ur Screen: NOT DETECTED
Phencyclidine (PCP) Ur S: NOT DETECTED
TRICYCLIC, UR SCREEN: NOT DETECTED

## 2018-04-26 LAB — CBC
HEMATOCRIT: 35.4 % — AB (ref 39.0–52.0)
Hemoglobin: 10.9 g/dL — ABNORMAL LOW (ref 13.0–17.0)
MCH: 28.7 pg (ref 26.0–34.0)
MCHC: 30.8 g/dL (ref 30.0–36.0)
MCV: 93.2 fL (ref 80.0–100.0)
NRBC: 0 % (ref 0.0–0.2)
Platelets: 191 10*3/uL (ref 150–400)
RBC: 3.8 MIL/uL — AB (ref 4.22–5.81)
RDW: 13.3 % (ref 11.5–15.5)
WBC: 14.8 10*3/uL — ABNORMAL HIGH (ref 4.0–10.5)

## 2018-04-26 LAB — MAGNESIUM: Magnesium: 1.9 mg/dL (ref 1.7–2.4)

## 2018-04-26 LAB — INFLUENZA PANEL BY PCR (TYPE A & B)
INFLAPCR: NEGATIVE
Influenza B By PCR: NEGATIVE

## 2018-04-26 LAB — MRSA PCR SCREENING: MRSA by PCR: NEGATIVE

## 2018-04-26 LAB — POTASSIUM: Potassium: 4.2 mmol/L (ref 3.5–5.1)

## 2018-04-26 LAB — APTT: APTT: 29 s (ref 24–36)

## 2018-04-26 LAB — TROPONIN I
Troponin I: 0.03 ng/mL (ref <0.03)
Troponin I: 0.16 ng/mL (ref <0.03)

## 2018-04-26 LAB — GLUCOSE, CAPILLARY: Glucose-Capillary: 126 mg/dL — ABNORMAL HIGH (ref 70–99)

## 2018-04-26 LAB — LACTIC ACID, PLASMA
Lactic Acid, Venous: 1.4 mmol/L (ref 0.5–1.9)
Lactic Acid, Venous: 1.6 mmol/L (ref 0.5–1.9)

## 2018-04-26 LAB — PROTIME-INR
INR: 0.99
Prothrombin Time: 13 seconds (ref 11.4–15.2)

## 2018-04-26 LAB — BRAIN NATRIURETIC PEPTIDE: B Natriuretic Peptide: 717 pg/mL — ABNORMAL HIGH (ref 0.0–100.0)

## 2018-04-26 LAB — ETHANOL: Alcohol, Ethyl (B): 10 mg/dL — ABNORMAL HIGH (ref <10)

## 2018-04-26 MED ORDER — SODIUM CHLORIDE 0.9 % IV SOLN
2.0000 g | INTRAVENOUS | Status: DC
  Administered 2018-04-26: 1 g via INTRAVENOUS

## 2018-04-26 MED ORDER — ORAL CARE MOUTH RINSE
15.0000 mL | OROMUCOSAL | Status: DC
  Administered 2018-04-26 – 2018-04-29 (×33): 15 mL via OROMUCOSAL

## 2018-04-26 MED ORDER — BISACODYL 10 MG RE SUPP: 10.0000 mg | Freq: Every day | RECTAL | Status: DC | PRN

## 2018-04-26 MED ORDER — FUROSEMIDE 10 MG/ML IJ SOLN: 40.0000 mg | Freq: Two times a day (BID) | INTRAMUSCULAR | Status: DC

## 2018-04-26 MED ORDER — NOREPINEPHRINE 4 MG/250ML-% IV SOLN
0.0000 ug/min | INTRAVENOUS | Status: DC
  Administered 2018-04-26: 13 ug/min via INTRAVENOUS
  Administered 2018-04-26: 10 ug/min via INTRAVENOUS
  Administered 2018-04-26 (×2): 12 ug/min via INTRAVENOUS
  Administered 2018-04-27: 20 ug/min via INTRAVENOUS
  Administered 2018-04-27: 13 ug/min via INTRAVENOUS
  Administered 2018-04-27: 18 ug/min via INTRAVENOUS
  Filled 2018-04-26 (×8): qty 250

## 2018-04-26 MED ORDER — CHLORHEXIDINE GLUCONATE 0.12% ORAL RINSE (MEDLINE KIT): 15.0000 mL | Freq: Two times a day (BID) | OROMUCOSAL | Status: DC

## 2018-04-26 MED ORDER — PROPOFOL 1000 MG/100ML IV EMUL
INTRAVENOUS | Status: AC | PRN
  Administered 2018-04-26: 01:00:00 via INTRAVENOUS
  Administered 2018-04-26: 20 ug/kg/min via INTRAVENOUS

## 2018-04-26 MED ORDER — BISACODYL 5 MG PO TBEC: 5.0000 mg | DELAYED_RELEASE_TABLET | Freq: Every day | ORAL | Status: DC | PRN

## 2018-04-26 MED ORDER — SODIUM CHLORIDE 0.9% FLUSH: 10.0000 mL | INTRAVENOUS | Status: DC | PRN

## 2018-04-26 MED ORDER — DOCUSATE SODIUM 100 MG PO CAPS
100.0000 mg | ORAL_CAPSULE | Freq: Two times a day (BID) | ORAL | Status: DC
  Administered 2018-04-26: 100 mg via ORAL
  Filled 2018-04-26 (×2): qty 1

## 2018-04-26 MED ORDER — FENTANYL 2500MCG IN NS 250ML (10MCG/ML) PREMIX INFUSION
25.0000 ug/h | INTRAVENOUS | Status: DC
  Administered 2018-04-26: 200 ug/h via INTRAVENOUS
  Administered 2018-04-26 (×2): 300 ug/h via INTRAVENOUS
  Administered 2018-04-27: 350 ug/h via INTRAVENOUS
  Filled 2018-04-26 (×3): qty 250

## 2018-04-26 MED ORDER — SODIUM CHLORIDE 0.9 % IV SOLN
INTRAVENOUS | Status: AC
  Filled 2018-04-26: qty 10

## 2018-04-26 MED ORDER — ATORVASTATIN CALCIUM 20 MG PO TABS
40.0000 mg | ORAL_TABLET | Freq: Every day | ORAL | Status: DC
  Administered 2018-04-26 (×2): 40 mg via ORAL
  Filled 2018-04-26 (×2): qty 2

## 2018-04-26 MED ORDER — TRAZODONE HCL 50 MG PO TABS: 25.0000 mg | ORAL_TABLET | Freq: Every evening | ORAL | Status: DC | PRN

## 2018-04-26 MED ORDER — FAMOTIDINE IN NACL 20-0.9 MG/50ML-% IV SOLN
20.0000 mg | Freq: Every day | INTRAVENOUS | Status: DC
  Administered 2018-04-26: 20 mg via INTRAVENOUS
  Filled 2018-04-26: qty 50

## 2018-04-26 MED ORDER — ONDANSETRON HCL 4 MG/2ML IJ SOLN: 4.0000 mg | Freq: Four times a day (QID) | INTRAMUSCULAR | Status: DC | PRN

## 2018-04-26 MED ORDER — SODIUM CHLORIDE 0.9 % IV SOLN
1.0000 g | INTRAVENOUS | Status: DC
  Administered 2018-04-26: 1 g via INTRAVENOUS
  Filled 2018-04-26: qty 10

## 2018-04-26 MED ORDER — ONDANSETRON HCL 4 MG PO TABS: 4.0000 mg | ORAL_TABLET | Freq: Four times a day (QID) | ORAL | Status: DC | PRN

## 2018-04-26 MED ORDER — ASPIRIN EC 81 MG PO TBEC
81.0000 mg | DELAYED_RELEASE_TABLET | Freq: Every day | ORAL | Status: DC
  Administered 2018-04-26: 81 mg via ORAL
  Filled 2018-04-26: qty 1

## 2018-04-26 MED ORDER — IPRATROPIUM-ALBUTEROL 0.5-2.5 (3) MG/3ML IN SOLN
3.0000 mL | Freq: Four times a day (QID) | RESPIRATORY_TRACT | Status: DC
  Administered 2018-04-26 – 2018-04-27 (×6): 3 mL via RESPIRATORY_TRACT
  Filled 2018-04-26 (×5): qty 3

## 2018-04-26 MED ORDER — STERILE WATER FOR INJECTION IJ SOLN
INTRAMUSCULAR | Status: AC
  Administered 2018-04-26: 10:00:00
  Filled 2018-04-26: qty 10

## 2018-04-26 MED ORDER — FENTANYL BOLUS VIA INFUSION
25.0000 ug | INTRAVENOUS | Status: DC | PRN
  Administered 2018-04-27 (×2): 25 ug via INTRAVENOUS
  Filled 2018-04-26: qty 25

## 2018-04-26 MED ORDER — ETOMIDATE 2 MG/ML IV SOLN
INTRAVENOUS | Status: AC | PRN
  Administered 2018-04-26: 20 mg via INTRAVENOUS

## 2018-04-26 MED ORDER — ACETAMINOPHEN 325 MG PO TABS
650.0000 mg | ORAL_TABLET | Freq: Four times a day (QID) | ORAL | Status: DC | PRN
  Administered 2018-04-26 (×2): 650 mg via ORAL
  Filled 2018-04-26 (×2): qty 2

## 2018-04-26 MED ORDER — SODIUM CHLORIDE 0.9% FLUSH
10.0000 mL | Freq: Two times a day (BID) | INTRAVENOUS | Status: DC
  Administered 2018-04-26 – 2018-04-27 (×2): 10 mL
  Administered 2018-04-27: 20 mL
  Administered 2018-04-28 (×2): 10 mL
  Administered 2018-04-29: 30 mL
  Administered 2018-04-29 – 2018-04-30 (×3): 10 mL
  Administered 2018-05-01: 40 mL
  Administered 2018-05-01 – 2018-05-02 (×2): 10 mL
  Administered 2018-05-02 – 2018-05-03 (×2): 30 mL
  Administered 2018-05-03 – 2018-05-05 (×5): 10 mL
  Administered 2018-05-06: 20 mL
  Administered 2018-05-06: 30 mL
  Administered 2018-05-07 – 2018-05-08 (×3): 10 mL
  Administered 2018-05-08 – 2018-05-09 (×2): 20 mL
  Administered 2018-05-09: 30 mL
  Administered 2018-05-10 – 2018-05-11 (×4): 10 mL

## 2018-04-26 MED ORDER — VECURONIUM BROMIDE 10 MG IV SOLR
INTRAVENOUS | Status: AC
  Administered 2018-04-26: 10 mg via INTRAVENOUS
  Filled 2018-04-26: qty 10

## 2018-04-26 MED ORDER — ORAL CARE MOUTH RINSE
15.0000 mL | OROMUCOSAL | Status: DC
  Administered 2018-04-26: 15 mL via OROMUCOSAL

## 2018-04-26 MED ORDER — HYDROCODONE-ACETAMINOPHEN 5-325 MG PO TABS: 1.0000 | ORAL_TABLET | ORAL | Status: DC | PRN

## 2018-04-26 MED ORDER — VECURONIUM BROMIDE 10 MG IV SOLR
10.0000 mg | Freq: Once | INTRAVENOUS | Status: AC
  Administered 2018-04-26: 10 mg via INTRAVENOUS

## 2018-04-26 MED ORDER — FENTANYL CITRATE (PF) 100 MCG/2ML IJ SOLN: 50.0000 ug | Freq: Once | INTRAMUSCULAR | Status: DC

## 2018-04-26 MED ORDER — PANTOPRAZOLE SODIUM 40 MG PO TBEC: 40.0000 mg | DELAYED_RELEASE_TABLET | Freq: Every day | ORAL | Status: DC

## 2018-04-26 MED ORDER — PANCRELIPASE (LIP-PROT-AMYL) 12000-38000 UNITS PO CPEP: 24000.0000 [IU] | ORAL_CAPSULE | Freq: Two times a day (BID) | ORAL | Status: DC

## 2018-04-26 MED ORDER — FUROSEMIDE 10 MG/ML IJ SOLN
80.0000 mg | Freq: Once | INTRAMUSCULAR | Status: AC
  Administered 2018-04-26: 80 mg via INTRAVENOUS
  Filled 2018-04-26: qty 8

## 2018-04-26 MED ORDER — SODIUM CHLORIDE 0.9 % IV SOLN
INTRAVENOUS | Status: DC
  Administered 2018-04-26: 100 mL/h via INTRAVENOUS
  Administered 2018-04-26 – 2018-04-27 (×2): via INTRAVENOUS

## 2018-04-26 MED ORDER — CHLORHEXIDINE GLUCONATE 0.12% ORAL RINSE (MEDLINE KIT)
15.0000 mL | Freq: Two times a day (BID) | OROMUCOSAL | Status: DC
  Administered 2018-04-26 – 2018-04-29 (×8): 15 mL via OROMUCOSAL

## 2018-04-26 MED ORDER — PANTOPRAZOLE SODIUM 40 MG IV SOLR
40.0000 mg | INTRAVENOUS | Status: DC
  Administered 2018-04-26: 40 mg via INTRAVENOUS
  Filled 2018-04-26: qty 40

## 2018-04-26 MED ORDER — SODIUM CHLORIDE 0.9 % IV SOLN
2.0000 g | INTRAVENOUS | Status: DC
  Administered 2018-04-26 – 2018-04-27 (×2): 2 g via INTRAVENOUS
  Filled 2018-04-26 (×2): qty 2
  Filled 2018-04-26: qty 20

## 2018-04-26 MED ORDER — NOREPINEPHRINE 4 MG/250ML-% IV SOLN: 0.0000 ug/min | Freq: Once | INTRAVENOUS | Status: DC

## 2018-04-26 MED ORDER — CLOPIDOGREL BISULFATE 75 MG PO TABS
75.0000 mg | ORAL_TABLET | Freq: Every day | ORAL | Status: DC
  Administered 2018-04-26: 75 mg via ORAL
  Filled 2018-04-26: qty 1

## 2018-04-26 MED ORDER — PROPOFOL 1000 MG/100ML IV EMUL
INTRAVENOUS | Status: AC
  Filled 2018-04-26: qty 100

## 2018-04-26 MED ORDER — ACETAMINOPHEN 650 MG RE SUPP: 650.0000 mg | Freq: Four times a day (QID) | RECTAL | Status: DC | PRN

## 2018-04-26 MED ORDER — SODIUM CHLORIDE 0.9 % IV SOLN
500.0000 mg | INTRAVENOUS | Status: DC
  Administered 2018-04-27: 500 mg via INTRAVENOUS
  Filled 2018-04-26: qty 500

## 2018-04-26 MED ORDER — MIDAZOLAM HCL 5 MG/5ML IJ SOLN
INTRAMUSCULAR | Status: AC | PRN
  Administered 2018-04-26: 1 mg via INTRAVENOUS

## 2018-04-26 MED ORDER — ROCURONIUM BROMIDE 50 MG/5ML IV SOLN
INTRAVENOUS | Status: AC | PRN
  Administered 2018-04-26: 100 mg via INTRAVENOUS

## 2018-04-26 MED ORDER — MIDAZOLAM HCL 2 MG/2ML IJ SOLN
1.0000 mg | INTRAMUSCULAR | Status: DC | PRN
  Administered 2018-04-27 (×2): 1 mg via INTRAVENOUS
  Filled 2018-04-26: qty 2

## 2018-04-26 MED ORDER — HEPARIN SODIUM (PORCINE) 5000 UNIT/ML IJ SOLN
5000.0000 [IU] | Freq: Three times a day (TID) | INTRAMUSCULAR | Status: DC
  Administered 2018-04-26 – 2018-04-29 (×10): 5000 [IU] via SUBCUTANEOUS
  Filled 2018-04-26 (×10): qty 1

## 2018-04-26 MED ORDER — SENNOSIDES 8.8 MG/5ML PO SYRP
5.0000 mL | ORAL_SOLUTION | Freq: Two times a day (BID) | ORAL | Status: DC | PRN
  Filled 2018-04-26: qty 5

## 2018-04-26 MED ORDER — FENTANYL 2500MCG IN NS 250ML (10MCG/ML) PREMIX INFUSION
100.0000 ug/h | INTRAVENOUS | Status: DC
  Administered 2018-04-26: 100 ug/h via INTRAVENOUS

## 2018-04-26 MED ORDER — FENTANYL 2500MCG IN NS 250ML (10MCG/ML) PREMIX INFUSION
INTRAVENOUS | Status: AC
  Filled 2018-04-26: qty 250

## 2018-04-26 MED ORDER — MIDAZOLAM HCL 2 MG/2ML IJ SOLN
1.0000 mg | INTRAMUSCULAR | Status: DC | PRN
  Filled 2018-04-26: qty 2

## 2018-04-26 MED ORDER — SODIUM CHLORIDE 0.9 % IV SOLN
500.0000 mg | Freq: Once | INTRAVENOUS | Status: AC
  Administered 2018-04-26: 500 mg via INTRAVENOUS
  Filled 2018-04-26: qty 500

## 2018-04-26 NOTE — Progress Notes (Signed)
Spoke with Donah Driver RN re PICC order.  Pt with pending blood cultures drawn this am Code Sepsis.  Will wait pending 48 hr results negative.  Pt currently has 4 PIV working well per [employer].  Current medications and IV fluids compatible.

## 2018-04-26 NOTE — Progress Notes (Signed)
In House IV Team RN contacted new grad ICU RN Donah Driver re PICC placement, writing RN did not speak with IV team.  Per Donah Driver IV team declines to place PICC d/t pending blood cultures and pt has working PIVs with no current meds that can't be given through those.  Pt's pressor requirements and sedation requirements increasing, he appears in septic shock and is febrile.  Per Dr Jefferson Fuel we need a PICC line for this patient, therefore [facility] will be called.

## 2018-04-26 NOTE — Progress Notes (Signed)
Writing RN and Malachy Mood RN called Pt's son Kevan Ny and got his permission to sign consent form regarding PICC line insertion.

## 2018-04-26 NOTE — Progress Notes (Signed)
Pt's cell phone rang several times from caller Sam. Writing RN was able to answer one call and told him that Pt was admitted to [facility]-ICU room 17. Caller acknowledged it.

## 2018-04-26 NOTE — ED Triage Notes (Signed)
Per EMS pt uncooperative, low sats and unable to be rational related to O2 sats. Refused cpap in field

## 2018-04-26 NOTE — ED Provider Notes (Signed)
[facility] Emergency Department Provider Note  ____________________________________________   First MD Initiated Contact with Patient 04/26/18 0147     (approximate)  I have reviewed the triage vital signs and the nursing notes.   HISTORY  Chief Complaint Respiratory Distress  Level 5 caveat:  history/ROS limited by acute/critical illness  HPI Matthew Zamora is a 77 y.o. male with medical history as listed below who presents by EMS in severe respiratory distress.  The patient is not able to provide any history other than the fact he has not been able to breathe for the last few hours.  EMS reports that he told them that he had acute onset of shortness of breath within the last few hours.  He was feeling very hot and short of breath like he could not get enough air, and when they arrived they found that he had gone outside with his shirt off (it is about 60 F tonight).  He was extremely hypertensive in the 190/100 range and they administered nitroglycerin 0.4 mg by mouth.  They put him on a nonrebreather with 100% oxygen because his initial oxygen saturation was in the low 80s.  They attempted to start him on CPAP but he fought them and refused the CPAP.  Upon arrival to the ED he is in severe distress and somewhat combative.  He is awake and alert and refusing the mask stating that he cannot breathe and trying to pull off the nonrebreather.  He denies chest pain and abdominal pain.  See hospital course for additional details.  Past Medical History:  Diagnosis Date  . Abdominal aortic aneurysm (AAA) ([place]) 05/13/15   seen on ct scan  . 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 Anemia   . 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 CAD (coronary artery disease)   . Cataract cortical, senile   . CHF (congestive heart failure) ([place])   . Chronic hoarseness   . Exocrine pancreatic insufficiency   . H. pylori infection   . History of hepatitis    . Hyperlipidemia   . Hypertension   . 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 Prostate CA [facility])     Patient Active Problem List   Diagnosis Date Noted  . Acute respiratory failure ([place]) 04/26/2018    Past Surgical History:  Procedure Laterality Date  . CATARACT EXTRACTION    . COLONOSCOPY  10/06/2014, 09/18/2004, 03/14/2009  . ESOPHAGOGASTRODUODENOSCOPY  10/06/2014, 03/18/2001, 03/14/2009  . FLEXIBLE SIGMOIDOSCOPY  08/26/1990  . INSERTION OF ICD    . PROSTATE SURGERY    . TONSILLECTOMY      Prior to Admission medications   Medication Sig Start Date End Date Taking? Authorizing Provider  aspirin EC 81 MG tablet Take 81 mg by mouth daily.   Yes [provider]  atorvastatin (LIPITOR) 40 MG tablet Take 40 mg by mouth daily.   Yes [provider]  clopidogrel (PLAVIX) 75 MG tablet Take 75 mg by mouth daily. 04/15/18  Yes [provider]  furosemide (LASIX) 40 MG tablet Take 20 mg by mouth daily.   Yes [provider]  lisinopril (PRINIVIL,ZESTRIL) 5 MG tablet Take 5 mg by mouth daily.   Yes [provider]  magnesium (MAGTAB) 84 MG (7MEQ) TBCR SR tablet Take 84 mg by mouth daily.   Yes [provider]  Melatonin 1 MG TABS Take 1 tablet by mouth at bedtime as needed. 12/27/13  Yes [provider]  metoprolol succinate (TOPROL-XL) 100 MG  24 hr tablet Take 100 mg by mouth daily. Take with or immediately following a meal.   Yes [provider]  Multiple Vitamins-Minerals (MULTIVITAMIN WITH MINERALS) tablet Take 1 tablet by mouth daily.   Yes [provider]  omeprazole (PRILOSEC) 20 MG capsule Take 20 mg by mouth daily.   Yes [provider]  oxybutynin (DITROPAN XL) 15 MG 24 hr tablet Take 15 mg by mouth at bedtime.   Yes [provider]  lipase/protease/amylase (CREON) 12000 units CPEP capsule Take 24,000 Units by mouth 2 (two) times daily at 10 am and 4 pm.    [provider]    Allergies Patient  has no allergy information on record.  Family History  Problem Relation Age of Onset  . Heart attack Mother   . Heart attack Father     Social History Social History   Tobacco Use  . Smoking status: Former Smoker    Packs/day: 1.00    Years: 38.00    Pack years: 38.00    Types: Cigarettes    Last attempt to quit: 06/18/1999    Years since quitting: 18.8  . Smokeless tobacco: Never Used  Substance Use Topics  . Alcohol use: No    Alcohol/week: 0.0 standard drinks  . Drug use: Not on file    Review of Systems Level 5 caveat:  history/ROS limited by acute/critical illness ____________________________________________   PHYSICAL EXAM:  VITAL SIGNS: ED Triage Vitals  Enc Vitals Group     BP 04/26/18 0038 104/81     Pulse Rate 04/26/18 0038 (!) 102     Resp 04/26/18 0038 (!) 21     Temp 04/26/18 0040 (!) 92.4 F (33.6 C)     Temp Source 04/26/18 0020 Other     SpO2 04/26/18 0020 (!) 88 %     Weight 04/26/18 0057 81 kg (178 lb 9.2 oz)     Height 04/26/18 0057 1.803 m (5\' 11" )     Head Circumference --      Peak Flow --      Pain Score 04/26/18 0057 0     Pain Loc --      Pain Edu? --      Excl. in [place]? --     Constitutional: Alert and in severe distress, combative and refusing oxygen treatment Eyes: Conjunctivae are normal.  Head: Atraumatic. Nose: No congestion/rhinnorhea. Mouth/Throat: Mucous membranes are moist. Neck: No stridor.  No meningeal signs.   Cardiovascular: Mild tachycardia, regular rhythm. Good peripheral circulation. Grossly normal heart sounds.  Pacemaker is in the left chest. Respiratory: Severe respiratory distress with increased work of breathing, intercostal muscle retractions, and very coarse breath sounds throughout all lung fields with decreased air movement. Gastrointestinal: Soft and nontender. No distention.  Musculoskeletal: No lower extremity tenderness nor edema. No gross deformities of extremities. Neurologic:  No gross focal  neurologic deficits are appreciated but the patient is unable to participate in neurological exam.  He is moving all 4 limbs without difficulty. Skin:  Skin is cool to the touch likely because he was outside, dry and intact. No rash noted.   ____________________________________________   LABS (all labs ordered are listed, but only abnormal results are displayed)  Labs Reviewed  COMPREHENSIVE METABOLIC PANEL - Abnormal; Notable for the following components:      Result Value   Glucose, Bld 200 (*)    Creatinine, Ser 1.85 (*)    Calcium 8.8 (*)    GFR calc non Af Wyvonnia Lora  34 (*)    GFR calc Af Amer 39 (*)    All other components within normal limits  BLOOD GAS, ARTERIAL - Abnormal; Notable for the following components:   pH, Arterial 7.25 (*)    pCO2 arterial 54 (*)    pO2, Arterial 126 (*)    Acid-base deficit 4.1 (*)    All other components within normal limits  BRAIN NATRIURETIC PEPTIDE - Abnormal; Notable for the following components:   B Natriuretic Peptide 717.0 (*)    All other components within normal limits  TROPONIN I - Abnormal; Notable for the following components:   Troponin I 0.03 (*)    All other components within normal limits  CBC WITH DIFFERENTIAL/PLATELET - Abnormal; Notable for the following components:   WBC 13.2 (*)    Monocytes Absolute 1.1 (*)    Abs Immature Granulocytes 0.08 (*)    All other components within normal limits  URINALYSIS, COMPLETE (UACMP) WITH MICROSCOPIC - Abnormal; Notable for the following components:   Color, Urine YELLOW (*)    APPearance CLEAR (*)    All other components within normal limits  ETHANOL - Abnormal; Notable for the following components:   Alcohol, Ethyl (B) 10 (*)    All other components within normal limits  CULTURE, BLOOD (ROUTINE X 2)  CULTURE, BLOOD (ROUTINE X 2)  MRSA PCR SCREENING  URINE DRUG SCREEN, QUALITATIVE ([facility] ONLY)  PROTIME-INR  INFLUENZA PANEL BY PCR (TYPE A & B)  LACTIC ACID, PLASMA  PROCALCITONIN    APTT  LACTIC ACID, PLASMA  BASIC METABOLIC PANEL  CBC   ____________________________________________  EKG  ED ECG REPORT I, Hinda Kehr, the attending physician, personally viewed and interpreted this ECG.  Date: 04/26/2018 EKG Time: 00: 29 Rate: 109 Rhythm: Sinus tachycardia with occasional PVC QRS Axis: normal Intervals: normal ST/T Wave abnormalities: Non-specific ST segment / T-wave changes, but no evidence of acute ischemia. Narrative Interpretation: Significant artifact is present, but there is no evidence of acute ischemia at this time.  The pacer spikes are not immediately obvious on this EKG  But the patient does have a pacemaker   ____________________________________________  RADIOLOGY I, Hinda Kehr, personally viewed and evaluated these images (plain radiographs) as part of my medical decision making, as well as reviewing the written report by the radiologist.  ED MD interpretation: Initial chest x-ray was concerning for pulmonary edema which fits clinically.  It was also concerning for the possibility of  Atypical pneumonia.  I repeated the chest x-ray to look for any evidence of pneumothorax when the patient's blood pressure dropped precipitously and there is no significant change clinically and no evidence of pneumothorax.  Official radiology report(s): Dg Chest Portable 1 View  Result Date: 04/26/2018 CLINICAL DATA:  Acute onset of hypotension. Evaluate for pneumothorax. EXAM: PORTABLE CHEST 1 VIEW COMPARISON:  Chest radiograph performed earlier today at 12:42 a.m. FINDINGS: The patient's endotracheal tube is seen ending 4-5 cm above the carina. And enteric tube is noted extending below the diaphragm. No pneumothorax is seen. The hazy left-sided airspace opacity is again noted, which may reflect pneumonia or interstitial edema. No significant pleural effusion or pneumothorax is seen. Vascular congestion is noted. The cardiomediastinal silhouette is normal in  size. A pacemaker/AICD is noted overlying the left chest wall, with leads ending overlying the right atrium and right ventricle. No acute osseous abnormalities are seen. IMPRESSION: 1. No evidence of pneumothorax. 2. Endotracheal tube seen ending 4-5 cm above the carina. 3. Hazy left-sided airspace  opacity again noted, which may reflect pneumonia or interstitial edema. Vascular congestion noted. Electronically Signed   By: Garald Balding M.D.   On: 04/26/2018 01:47   Dg Chest Portable 1 View  Result Date: 04/26/2018 CLINICAL DATA:  Endotracheal tube placement and orogastric tube placement. Assess for pneumonia. EXAM: PORTABLE CHEST 1 VIEW COMPARISON:  Chest radiograph performed 10/16/2008 FINDINGS: The patient's endotracheal tube is seen ending 4 cm above the carina. An enteric tube is noted extending below the diaphragm. Patchy left-sided airspace opacity may reflect worsening pneumonia or asymmetric pulmonary edema. No definite pleural effusion or pneumothorax is seen. Underlying vascular congestion is noted. The cardiomediastinal silhouette is normal in size. A pacemaker/AICD is noted overlying the left chest wall, with leads ending overlying the right atrium and right ventricle. No acute osseous abnormalities are identified. IMPRESSION: 1. Endotracheal tube seen ending 4 cm above the carina. 2. Patchy left-sided airspace opacity may reflect worsening pneumonia or asymmetric pulmonary edema. Underlying vascular congestion noted. Electronically Signed   By: Garald Balding M.D.   On: 04/26/2018 01:09    ____________________________________________   PROCEDURES  Critical Care performed: Yes, see critical care procedure note(s)   Procedure(s) performed:   .Critical Care Performed by: Hinda Kehr, MD Authorized by: Hinda Kehr, MD   Critical care provider statement:    Critical care time (minutes):  75   Critical care time was exclusive of:  Separately billable procedures and treating other  patients   Critical care was necessary to treat or prevent imminent or life-threatening deterioration of the following conditions:  Respiratory failure and circulatory failure   Critical care was time spent personally by me on the following activities:  Development of treatment plan with patient or surrogate, discussions with consultants, evaluation of patient's response to treatment, examination of patient, obtaining history from patient or surrogate, ordering and performing treatments and interventions, ordering and review of laboratory studies, ordering and review of radiographic studies, pulse oximetry, re-evaluation of patient's condition and review of old charts Procedure Name: Intubation Date/Time: 04/26/2018 2:42 AM Performed by: Hinda Kehr, MD Pre-anesthesia Checklist: Patient identified, Emergency Drugs available, Suction available and Patient being monitored Preoxygenation: Pre-oxygenation with 100% oxygen Induction Type: IV induction and Rapid sequence Laryngoscope Size: Glidescope Tube size: 7.5 mm Number of attempts: 1 Placement Confirmation: ETT inserted through vocal cords under direct vision,  CO2 detector and Breath sounds checked- equal and bilateral Secured at: 23 cm Tube secured with: ETT holder Dental Injury: Teeth and Oropharynx as per pre-operative assessment         ____________________________________________   INITIAL IMPRESSION / ASSESSMENT AND PLAN / ED COURSE  As part of my medical decision making, I reviewed the following data within the [place] notes reviewed and incorporated, Labs reviewed , EKG interpreted , Old chart reviewed, Radiograph reviewed , Discussed with admitting physician  and Notes from prior ED visits    Differential diagnosis includes, but is not limited to, acute CHF exacerbation with flash pulmonary edema and uncontrolled hypertension, ACS, pneumonia and/or viral infection such as influenza, less likely  pulmonary embolism.  The patient was in severe distress upon arrival.  He was obviously getting tired and did not have the capacity to make medical decisions particularly given his hypoxemia and possible hypercapnia.  He was able to communicate to me that he had a claustrophobia and felt that he could not tolerate the mask.  At his request we pulled the nonrebreather away from his face and he quickly  began the setting to the mid to low 80s.  I administered 1 mg of Versed to try to calm him down and allow for bridging him for intubation because it was clear that he was going to tire and not support his own breathing.  After the milligram of Versed, while the nurse was pulling up the RSI medications, the patient became unresponsive although he continued breathing on his own.  I emergently intubated the patient as per my initial plan which was successful with one attempt.  I started him on propofol infusion and fentanyl infusion for sedation and pain control.  His blood pressure remained elevated in the 144Y systolic and I believe that the situation is most consistent with flash pulmonary edema and subsequent respiratory failure.  Lab work and chest x-ray and EKG are all pending and I will document their results in the hospital course to the best of my ability.  Clinical Course as of Apr 26 336  Nancy Fetter Apr 26, 2018  0111 Appears consistent with pulmonary edema.  Awaiting official interpretation, but starting diuresis with furosemide 80 mg IV.  Foley is in place.  DG Chest Portable 1 View [CF]  0120 Radiologist commented on the pulmonary vascular congestion/edema but also pointed out that the opacities visualized could represent atypical pneumonia as well as the possibility of asymmetric pulmonary edema.  I still believe that pulmonary edema is the primary issue.  He is very difficult to ventilate due to high peak pressures; he has started to diurese a bit but not a large volume.The patient is mildly hypothermic  and a Retail banker is in place.  This is likely because he was sitting outside when EMS arrived.  However given the appearance of the chest x-ray I have ordered ceftriaxone 1 g IV and azithromycin 500 mg IV for the possibility of community-acquired pneumonia.   [CF]  0137 Patient has become profoundly hypotensive.  Repeating chest x-ray but no evidence of pneumothorax.  I have backed off the propofol to 5.  I am starting levo fed and will have to start giving IV fluids back while he is diuresing.   [CF]  0138 Checking bedside ultrasound for any evidence of pericardial effusion.  I have also made the patient a code sepsis and I am giving him a total of 2 g Rocephin.  It is possible that he developed pneumonia which is also led to pulmonary edema.  Now that he is intubated his pressure is dropped possibly secondary to the sepsis.  I have started back fluids at a rate of about 500 and hour to try to provide additional intravascular volume.    [CF]  0149 (Note that documentation was delayed due to multiple ED patients requiring immediate care.)  Lab work is notable for a creatinine of about 1.8 but this is consistent with his prior values.  ABG indicates hypercapnia and his peak pressures remain high; the respiratory therapist is working with him to try to provide adequate ventilation.  Influenza panel is negative and lactic acid is within normal limits.  He does have a mild leukocytosis which could be secondary to pneumonia or a stress reaction from his respiratory failure.  Troponin 0 0.03 which likely reflects demand ischemia in the setting of sepsis and/or respiratory failure from pulmonary edema.  BNP is elevated at 717 and he has a mild elevation of his ethanol level.  Urine drug screen is negative and urinalysis is negative.  The patient's blood pressure is improving and is around  85 systolic.  He has adequate peripheral IV access at the moment with 4 large-bore peripheral IVs and the best thing for the  patient is to get him to the ICU for additional ventilation management and line placement as deemed necessary by the intensivist.I have discussed the case by phone with Dr. Duane Boston with the hospitalist service and she will admit to the ICU.   [CF]  1478 Tried to call son, Jacarri Gesner (emergency contact), but no answer and no voicemail.   [CF]  0249 Lactic Acid, Venous: 1.4 [CF]    Clinical Course User Index [CF] Hinda Kehr, MD    ____________________________________________  FINAL CLINICAL IMPRESSION(S) / ED DIAGNOSES  Final diagnoses:  Acute respiratory failure with hypoxia and hypercapnia ([name])  Sepsis, due to unspecified organism, unspecified whether acute organ dysfunction present [facility])  Community acquired pneumonia, unspecified laterality  Chronic kidney disease, unspecified CKD stage     MEDICATIONS GIVEN DURING THIS VISIT:  Medications  fentaNYL 2511mcg in NS 249mL (23mcg/ml) infusion-PREMIX (100 mcg/hr Intravenous Rate/Dose Change 04/26/18 0329)  azithromycin (ZITHROMAX) 500 mg in sodium chloride 0.9 % 250 mL IVPB ( Intravenous Rate/Dose Verify 04/26/18 0329)  norepinephrine (LEVOPHED) 4mg  in D5W 240mL premix infusion (8 mcg/min Intravenous Rate/Dose Verify 04/26/18 0329)  cefTRIAXone (ROCEPHIN) 2 g in sodium chloride 0.9 % 100 mL IVPB (0 g Intravenous Stopped 04/26/18 0221)  azithromycin (ZITHROMAX) 500 mg in sodium chloride 0.9 % 250 mL IVPB (has no administration in time range)  ipratropium-albuterol (DUONEB) 0.5-2.5 (3) MG/3ML nebulizer solution 3 mL (has no administration in time range)  clopidogrel (PLAVIX) tablet 75 mg (has no administration in time range)  pantoprazole (PROTONIX) EC tablet 40 mg (has no administration in time range)  lipase/protease/amylase (CREON) capsule 24,000 Units (has no administration in time range)  atorvastatin (LIPITOR) tablet 40 mg (has no administration in time range)  aspirin EC tablet 81 mg (has no administration in time range)    furosemide (LASIX) injection 40 mg (has no administration in time range)  heparin injection 5,000 Units (has no administration in time range)  acetaminophen (TYLENOL) tablet 650 mg (has no administration in time range)    Or  acetaminophen (TYLENOL) suppository 650 mg (has no administration in time range)  HYDROcodone-acetaminophen (NORCO/VICODIN) 5-325 MG per tablet 1-2 tablet (has no administration in time range)  traZODone (DESYREL) tablet 25 mg (has no administration in time range)  docusate sodium (COLACE) capsule 100 mg (has no administration in time range)  bisacodyl (DULCOLAX) EC tablet 5 mg (has no administration in time range)  ondansetron (ZOFRAN) tablet 4 mg (has no administration in time range)    Or  ondansetron (ZOFRAN) injection 4 mg (has no administration in time range)  midazolam (VERSED) 5 MG/5ML injection (1 mg Intravenous Given 04/26/18 0030)  etomidate (AMIDATE) injection (20 mg Intravenous Given 04/26/18 0031)  rocuronium (ZEMURON) injection (100 mg Intravenous Given 04/26/18 0033)  propofol (DIPRIVAN) 1000 MG/100ML infusion (10 mcg/kg/min  81 kg (Order-Specific) Intravenous Rate/Dose Verify 04/26/18 0329)  furosemide (LASIX) injection 80 mg (80 mg Intravenous Given 04/26/18 0116)     ED Discharge Orders    None       Note:  This document was prepared using Dragon voice recognition software and may include unintentional dictation errors.    Hinda Kehr, MD 04/26/18 [phone]

## 2018-04-26 NOTE — Progress Notes (Signed)
CODE SEPSIS - PHARMACY COMMUNICATION  **Broad Spectrum Antibiotics should be administered within 1 hour of Sepsis diagnosis**  Time Code Sepsis Called/Page Received: 0137  Antibiotics Ordered: azithro/ceftriaxone  Time of 1st antibiotic administration: 0202  Additional action taken by pharmacy:   If necessary, Name of Provider/Nurse Contacted:     Tobie Lords ,PharmD Clinical Pharmacist  04/26/2018  2:59 AM

## 2018-04-26 NOTE — Progress Notes (Signed)
Patient ID: Matthew Zamora, male   DOB: Jun 06, 1941, 77 y.o.   MRN: [id number]  [facility] PROGRESS NOTE  Matthew Zamora HGD:[id number] DOB: 1940-11-15 DOA: 04/26/2018 PCP: Maryland Pink, MD  HPI/Subjective: Patient intubated and sedated  Objective: Vitals:   04/26/18 1400 04/26/18 1411  BP: 105/62   Pulse: 75 73  Resp: 19 (!) 22  Temp: 99.3 F (37.4 C)   SpO2: 96% 96%    Filed Weights   04/26/18 0057 04/26/18 0300  Weight: 81 kg 76.2 kg    ROS: Review of Systems  Unable to perform ROS: Intubated   Exam: Physical Exam  Constitutional: He is intubated.  HENT:  Nose: No mucosal edema.  Unable to look into mouth  Eyes: Conjunctivae and lids are normal.  Pupils sluggish  Neck: Carotid bruit is not present. No thyromegaly present.  Cardiovascular: Regular rhythm, S1 normal, S2 normal and normal heart sounds.  Respiratory: He is intubated. He has decreased breath sounds in the right lower field and the left lower field. He has no wheezes. He has rhonchi in the right lower field and the left lower field. He has no rales.  GI: Soft. Bowel sounds are normal. There is no tenderness.  Musculoskeletal:       Right ankle: He exhibits no swelling.       Left ankle: He exhibits no swelling.  Neurological:  Intubated and sedated  Skin: Skin is warm. Rash noted. Nails show no clubbing.  Psychiatric:  Unable to assess secondary to being intubated and sedated      Data Reviewed: Basic Metabolic Panel: Recent Labs  Lab 04/26/18 0030 04/26/18 0447  NA 135 138  K 4.0 4.3  CL 102 104  CO2 23 25  GLUCOSE 200* 164*  BUN 21 22  CREATININE 1.85* 1.73*  CALCIUM 8.8* 8.3*   Liver Function Tests: Recent Labs  Lab 04/26/18 0030  AST 24  ALT 16  ALKPHOS 79  BILITOT 0.7  PROT 7.5  ALBUMIN 4.2   CBC: Recent Labs  Lab 04/26/18 0030 04/26/18 0447  WBC 13.2* 14.8*  NEUTROABS 7.7  --   HGB 13.0 10.9*  HCT 40.5 35.4*  MCV 91.4 93.2  PLT 237 191    Cardiac Enzymes: Recent Labs  Lab 04/26/18 0030 04/26/18 0447  TROPONINI 0.03* 0.16*   BNP (last 3 results) Recent Labs    04/26/18 0030  BNP 717.0*    CBG: Recent Labs  Lab 04/26/18 0735  GLUCAP 126*    Recent Results (from the past 240 hour(s))  Blood culture (routine x 2)     Status: None (Preliminary result)   Collection Time: 04/26/18  1:54 AM  Result Value Ref Range Status   Specimen Description BLOOD LEFT FATTY CASTS  Final   Special Requests   Final    BOTTLES DRAWN AEROBIC AND ANAEROBIC Blood Culture adequate volume   Culture   Final    NO GROWTH < 12 HOURS Performed at [facility], [address], [place],  [address]    Report Status PENDING  Incomplete  Blood culture (routine x 2)     Status: None (Preliminary result)   Collection Time: 04/26/18  1:55 AM  Result Value Ref Range Status   Specimen Description BLOOD LEFT ASSIST CONTROL  Final   Special Requests   Final    BOTTLES DRAWN AEROBIC AND ANAEROBIC Blood Culture results may not be optimal due to an excessive volume of blood received in culture bottles  Culture   Final    NO GROWTH < 12 HOURS Performed at [facility], [address] [place], [place] [address]    Report Status PENDING  Incomplete  MRSA PCR Screening     Status: None   Collection Time: 04/26/18  2:54 AM  Result Value Ref Range Status   MRSA by PCR NEGATIVE NEGATIVE Final    Comment:        The GeneXpert MRSA Assay (FDA approved for NASAL specimens only), is one component of a comprehensive MRSA colonization surveillance program. It is not intended to diagnose MRSA infection nor to guide or monitor treatment for MRSA infections. Performed at [facility], [address], [place], [place] [address]      Studies: Dg Chest Portable 1 View  Result Date: 04/26/2018 CLINICAL DATA:  Acute onset of hypotension. Evaluate for pneumothorax. EXAM: PORTABLE CHEST 1 VIEW COMPARISON:  Chest  radiograph performed earlier today at 12:42 a.m. FINDINGS: The patient's endotracheal tube is seen ending 4-5 cm above the carina. And enteric tube is noted extending below the diaphragm. No pneumothorax is seen. The hazy left-sided airspace opacity is again noted, which may reflect pneumonia or interstitial edema. No significant pleural effusion or pneumothorax is seen. Vascular congestion is noted. The cardiomediastinal silhouette is normal in size. A pacemaker/AICD is noted overlying the left chest wall, with leads ending overlying the right atrium and right ventricle. No acute osseous abnormalities are seen. IMPRESSION: 1. No evidence of pneumothorax. 2. Endotracheal tube seen ending 4-5 cm above the carina. 3. Hazy left-sided airspace opacity again noted, which may reflect pneumonia or interstitial edema. Vascular congestion noted. Electronically Signed   By: Garald Balding M.D.   On: 04/26/2018 01:47   Dg Chest Portable 1 View  Result Date: 04/26/2018 CLINICAL DATA:  Endotracheal tube placement and orogastric tube placement. Assess for pneumonia. EXAM: PORTABLE CHEST 1 VIEW COMPARISON:  Chest radiograph performed 10/16/2008 FINDINGS: The patient's endotracheal tube is seen ending 4 cm above the carina. An enteric tube is noted extending below the diaphragm. Patchy left-sided airspace opacity may reflect worsening pneumonia or asymmetric pulmonary edema. No definite pleural effusion or pneumothorax is seen. Underlying vascular congestion is noted. The cardiomediastinal silhouette is normal in size. A pacemaker/AICD is noted overlying the left chest wall, with leads ending overlying the right atrium and right ventricle. No acute osseous abnormalities are identified. IMPRESSION: 1. Endotracheal tube seen ending 4 cm above the carina. 2. Patchy left-sided airspace opacity may reflect worsening pneumonia or asymmetric pulmonary edema. Underlying vascular congestion noted. Electronically Signed   By: Garald Balding M.D.   On: 04/26/2018 01:09   [place] Ekg Site Rite  Result Date: 04/26/2018 If Site Rite image not attached, placement could not be confirmed due to current cardiac rhythm.   Scheduled Meds: . aspirin EC  81 mg Oral Daily  . atorvastatin  40 mg Oral Daily  . chlorhexidine gluconate (MEDLINE KIT)  15 mL Mouth Rinse BID  . clopidogrel  75 mg Oral Daily  . docusate sodium  100 mg Oral BID  . fentaNYL (SUBLIMAZE) injection  50 mcg Intravenous Once  . heparin  5,000 Units Subcutaneous Q8H  . ipratropium-albuterol  3 mL Nebulization Q6H  . lipase/protease/amylase  24,000 Units Oral BID  . mouth rinse  15 mL Mouth Rinse 10 times per day  . pantoprazole (PROTONIX) IV  40 mg Intravenous Q24H   Continuous Infusions: . sodium chloride 100 mL/hr at 04/26/18 1518  . [  START ON 04/27/2018] azithromycin    . cefTRIAXone (ROCEPHIN)  IV    . fentaNYL infusion INTRAVENOUS 300 mcg/hr (04/26/18 1225)  . norepinephrine (LEVOPHED) Adult infusion 12 mcg/min (04/26/18 1358)    Assessment/Plan:  1. Septic shock secondary to pneumonia.  Continue levophed.  Patient on Rocephin and Zithromax. 2. Acute hypoxic respiratory failure.  Currently on the vent 40%.  As per nursing staff needed to be paralyzed temporary last night and high-dose sedation so he does not fight the ventilator. 3. Elevated troponin secondary to demand ischemia with septic shock 4. Chronic kidney disease stage III 5. 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 Anemia of chronic disease 7. Hyperlipidemia unspecified on Lipitor 8. History of prostate cancer 9. History of congestive heart failure.  Echocardiogram done but still not read yet  Code Status:     Code Status Orders  (From admission, onward)         Start     Ordered   04/26/18 0254  Full code  Continuous     04/26/18 0253        Code Status History    This patient has a current code status but no historical code status.     Family Communication: As  per critical care specialist Disposition Plan: To be determined  Consultants:  Critical care specialist  Antibiotics: -Rocephin and Zithromax  Time spent: 32 minutes, patient is critically ill and high risk for cardiopulmonary arrest.  [facility]

## 2018-04-26 NOTE — Progress Notes (Signed)
Spoke with Zachery Dauer for this pt, Malachy Mood occupied in another room. Donah Driver RN confirms PICC is in place.  Notified that this writer placed in routine line care orders for PICC placed by CVW.  Also notified of recommendations to remove all PIV that are not necessary to decrease risk of CLABSI.

## 2018-04-26 NOTE — Progress Notes (Signed)
Pharmacy Antibiotic Note  Matthew Zamora is a 77 y.o. male admitted on 04/26/2018 with pneumonia.  Pharmacy has been consulted for ceftriaxone dosing.  Plan: Will continue ceftriaxone 2g IV daily for acute respiratory failure s/t CAP  Height: 5\' 11"  (180.3 cm) Weight: 167 lb 15.9 oz (76.2 kg) IBW/kg (Calculated) : 75.3  Temp (24hrs), Avg:96.8 F (36 C), Min:92.4 F (33.6 C), Max:97.8 F (36.6 C)  Recent Labs  Lab 04/26/18 0030 04/26/18 0154 04/26/18 0447  WBC 13.2*  --  14.8*  CREATININE 1.85*  --  1.73*  LATICACIDVEN  --  1.4 1.6    Estimated Creatinine Clearance: 38.7 mL/min (A) (by C-G formula based on SCr of 1.73 mg/dL (H)).    Not on File  Thank you for allowing pharmacy to be a part of this patient's care.  Tobie Lords, PharmD, BCPS Clinical Pharmacist 04/26/2018

## 2018-04-26 NOTE — H&P (Signed)
[place] [facility] [place] NAME: Matthew Zamora    MR#:  [id number]  DATE OF BIRTH:  Jan 20, 1941  DATE OF ADMISSION:  04/26/2018  PRIMARY CARE PHYSICIAN: Maryland Pink, MD   REQUESTING/REFERRING PHYSICIAN:   CHIEF COMPLAINT:   Chief Complaint  Patient presents with  . Respiratory Distress    HISTORY OF PRESENT ILLNESS: Matthew Zamora  is a 77 y.o. male with a known history of CAD, CKD3, pacemaker, HTN, anemia and other comorbidities. Patient is currently intubated and sedated, unable to provide history.  Information was taken from reviewing the medical chart and from discussion with emergency room physician. Per EMS report, patient called 911 because he was hot and very short of breath.  His blood pressure was 190/100 and oxygen saturation was in low 80s.  Paramedics administered nitroglycerin 0.4 mg by mouth and attempted to place patient on CPAP but he became agitated and uncooperative. At the arrival to emergency room, patient continued to be in severe respiratory distress and combative, hence he was intubated.  Following propofol and Lasix IV patient's blood pressure dropped to 60/40, for which patient was started on Levophed. Blood test done emergency room are notable for elevated WBC at 13.2, troponin 0 0.03, lactic acid 1.4, BNP 717, creatinine 1.85.  Arterial pH is 7.25. EKG shows sinus tachycardia with occasional PVCs, heart rate is 109.  No acute ischemic changes. Chest x-ray shows patchy left-sided airspace opacity may reflect worsening pneumonia or asymmetric pulmonary edema. Underlying vascular congestion noted.    PAST MEDICAL HISTORY:   Past Medical History:  Diagnosis Date  . Abdominal aortic aneurysm (AAA) ([place]) 05/13/15   seen on ct scan  . 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 Anemia   . 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 CAD (coronary artery disease)   . Cataract cortical, senile   .  CHF (congestive heart failure) ([place])   . Chronic hoarseness   . Exocrine pancreatic insufficiency   . H. pylori infection   . History of hepatitis   . Hyperlipidemia   . Hypertension   . 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 Prostate CA [facility])     PAST SURGICAL HISTORY:  Past Surgical History:  Procedure Laterality Date  . CATARACT EXTRACTION    . COLONOSCOPY  10/06/2014, 09/18/2004, 03/14/2009  . ESOPHAGOGASTRODUODENOSCOPY  10/06/2014, 03/18/2001, 03/14/2009  . FLEXIBLE SIGMOIDOSCOPY  08/26/1990  . INSERTION OF ICD    . PROSTATE SURGERY    . TONSILLECTOMY      SOCIAL HISTORY:  Social History   Tobacco Use  . Smoking status: Former Smoker    Packs/day: 1.00    Years: 38.00    Pack years: 38.00    Types: Cigarettes    Last attempt to quit: 06/18/1999    Years since quitting: 18.8  . Smokeless tobacco: Never Used  Substance Use Topics  . Alcohol use: No    Alcohol/week: 0.0 standard drinks    FAMILY HISTORY:  Family History  Problem Relation Age of Onset  . Heart attack Mother   . Heart attack Father     DRUG ALLERGIES: Not on File  REVIEW OF SYSTEMS:   Unable to obtain due to patient being sedated and intubated.  MEDICATIONS AT HOME:  Prior to Admission medications   Medication Sig Start Date End Date Taking? Authorizing Provider  aspirin EC 81 MG tablet Take 81 mg by mouth daily.   Yes [provider]  atorvastatin (LIPITOR) 40  MG tablet Take 40 mg by mouth daily.   Yes [provider]  clopidogrel (PLAVIX) 75 MG tablet Take 75 mg by mouth daily. 04/15/18  Yes [provider]  furosemide (LASIX) 40 MG tablet Take 20 mg by mouth daily.   Yes [provider]  lisinopril (PRINIVIL,ZESTRIL) 5 MG tablet Take 5 mg by mouth daily.   Yes [provider]  magnesium (MAGTAB) 84 MG (7MEQ) TBCR SR tablet Take 84 mg by mouth daily.   Yes [provider]  Melatonin 1 MG TABS Take 1 tablet by mouth at bedtime as needed. 12/27/13  Yes  [provider]  metoprolol succinate (TOPROL-XL) 100 MG 24 hr tablet Take 100 mg by mouth daily. Take with or immediately following a meal.   Yes [provider]  Multiple Vitamins-Minerals (MULTIVITAMIN WITH MINERALS) tablet Take 1 tablet by mouth daily.   Yes [provider]  omeprazole (PRILOSEC) 20 MG capsule Take 20 mg by mouth daily.   Yes [provider]  oxybutynin (DITROPAN XL) 15 MG 24 hr tablet Take 15 mg by mouth at bedtime.   Yes [provider]  lipase/protease/amylase (CREON) 12000 units CPEP capsule Take 24,000 Units by mouth 2 (two) times daily at 10 am and 4 pm.    [provider]      PHYSICAL EXAMINATION:   VITAL SIGNS: Blood pressure 124/83, pulse 97, temperature 97.6 F (36.4 C), resp. rate 20, height 5\' 11"  (1.803 m), weight 81 kg, SpO2 93 %.  GENERAL:  77 y.o.-year-old patient lying in the bed, on vent support, sedated.  EYES: Pupils equal, round, reactive to light and accommodation. No scleral icterus.  HEENT: Head atraumatic, normocephalic.  NECK:  Supple, no jugular venous distention. No thyroid enlargement, no tenderness.  LUNGS: Reduced breath sounds bilaterally, no wheezing, rales,rhonchi or crepitation.  On vent support. CARDIOVASCULAR: S1, S2 normal. No S3/S4.  ABDOMEN: Soft, nontender, nondistended. Bowel sounds present. No organomegaly or mass.  EXTREMITIES: No pedal edema, cyanosis, or clubbing.  NEUROLOGIC: Unable to perform neurologic exam, due to to patient being unresponsive, sedated. PSYCHIATRIC: The patient is unresponsive, sedated.  SKIN: No obvious rash, lesion, or ulcer.   LABORATORY PANEL:   CBC Recent Labs  Lab 04/26/18 0030  WBC 13.2*  HGB 13.0  HCT 40.5  PLT 237  MCV 91.4  MCH 29.3  MCHC 32.1  RDW 13.1  LYMPHSABS 3.8  MONOABS 1.1*  EOSABS 0.5  BASOSABS 0.1    ------------------------------------------------------------------------------------------------------------------  Chemistries  Recent Labs  Lab 04/26/18 0030  NA 135  K 4.0  CL 102  CO2 23  GLUCOSE 200*  BUN 21  CREATININE 1.85*  CALCIUM 8.8*  AST 24  ALT 16  ALKPHOS 79  BILITOT 0.7   ------------------------------------------------------------------------------------------------------------------ estimated creatinine clearance is 36.2 mL/min (A) (by C-G formula based on SCr of 1.85 mg/dL (H)). ------------------------------------------------------------------------------------------------------------------ No results for input(s): TSH, T4TOTAL, T3FREE, THYROIDAB in the last 72 hours.  Invalid input(s): FREET3   Coagulation profile Recent Labs  Lab 04/26/18 0030  INR 0.99   ------------------------------------------------------------------------------------------------------------------- No results for input(s): DDIMER in the last 72 hours. -------------------------------------------------------------------------------------------------------------------  Cardiac Enzymes Recent Labs  Lab 04/26/18 0030  TROPONINI 0.03*   ------------------------------------------------------------------------------------------------------------------ Invalid input(s): POCBNP  ---------------------------------------------------------------------------------------------------------------  Urinalysis    Component Value Date/Time   COLORURINE YELLOW (A) 04/26/2018 0030   APPEARANCEUR CLEAR (A) 04/26/2018 0030   LABSPEC 1.008 04/26/2018 0030   PHURINE 6.0 04/26/2018 0030   GLUCOSEU NEGATIVE 04/26/2018 0030   HGBUR  NEGATIVE 04/26/2018 0030   BILIRUBINUR NEGATIVE 04/26/2018 0030   KETONESUR NEGATIVE 04/26/2018 0030   PROTEINUR NEGATIVE 04/26/2018 0030   NITRITE NEGATIVE 04/26/2018 0030   LEUKOCYTESUR NEGATIVE 04/26/2018 0030     RADIOLOGY: Dg Chest Portable 1  View  Result Date: 04/26/2018 CLINICAL DATA:  Acute onset of hypotension. Evaluate for pneumothorax. EXAM: PORTABLE CHEST 1 VIEW COMPARISON:  Chest radiograph performed earlier today at 12:42 a.m. FINDINGS: The patient's endotracheal tube is seen ending 4-5 cm above the carina. And enteric tube is noted extending below the diaphragm. No pneumothorax is seen. The hazy left-sided airspace opacity is again noted, which may reflect pneumonia or interstitial edema. No significant pleural effusion or pneumothorax is seen. Vascular congestion is noted. The cardiomediastinal silhouette is normal in size. A pacemaker/AICD is noted overlying the left chest wall, with leads ending overlying the right atrium and right ventricle. No acute osseous abnormalities are seen. IMPRESSION: 1. No evidence of pneumothorax. 2. Endotracheal tube seen ending 4-5 cm above the carina. 3. Hazy left-sided airspace opacity again noted, which may reflect pneumonia or interstitial edema. Vascular congestion noted. Electronically Signed   By: Garald Balding M.D.   On: 04/26/2018 01:47   Dg Chest Portable 1 View  Result Date: 04/26/2018 CLINICAL DATA:  Endotracheal tube placement and orogastric tube placement. Assess for pneumonia. EXAM: PORTABLE CHEST 1 VIEW COMPARISON:  Chest radiograph performed 10/16/2008 FINDINGS: The patient's endotracheal tube is seen ending 4 cm above the carina. An enteric tube is noted extending below the diaphragm. Patchy left-sided airspace opacity may reflect worsening pneumonia or asymmetric pulmonary edema. No definite pleural effusion or pneumothorax is seen. Underlying vascular congestion is noted. The cardiomediastinal silhouette is normal in size. A pacemaker/AICD is noted overlying the left chest wall, with leads ending overlying the right atrium and right ventricle. No acute osseous abnormalities are identified. IMPRESSION: 1. Endotracheal tube seen ending 4 cm above the carina. 2. Patchy left-sided  airspace opacity may reflect worsening pneumonia or asymmetric pulmonary edema. Underlying vascular congestion noted. Electronically Signed   By: Garald Balding M.D.   On: 04/26/2018 01:09    EKG: Orders placed or performed during the hospital encounter of 04/26/18  . ED EKG  . ED EKG    IMPRESSION AND PLAN:  1.  Acute respiratory failure with hypoxia, likely multifactorial, secondary to acute pulmonary edema and pneumonia.  Will start patient on IV antibiotics, azithromycin and ceftriaxone.  Continue oxygen and nebulizer treatments.  Continue vent support. 2.  Acute respiratory acidosis, secondary to #1.  See treatment as above, under #1. 3.  Acute pulmonary edema.  Started on Lasix IV.  Continue oxygen and ventilator support. 4.  CAP, will start patient on IV antibiotics, azithromycin and ceftriaxone.  Continue oxygen and nebulizer treatments.  Continue vent support. 5.  Sepsis, likely secondary to CAP.  Patient started on antibiotics and IV fluids continue pressure support and ventilator support. 6.  CKD3.  Creatinine level is 1.85, at baseline.  Continue to monitor kidney function closely and avoid nephrotoxic medications. 7.  Borderline elevated troponin level, likely secondary to demand ischemia, from acute respiratory failure.  Continue to monitor patient on telemetry and follow troponin levels to rule out ACS.  Will check 2D echo.  All the records are reviewed and case discussed with ED and ICU providers.  CODE STATUS: Full    TOTAL TIME TAKING CARE OF THIS PATIENT: 55 minutes.    Amelia Jo M.D on 04/26/2018 at 2:18 AM  Between  7am to 6pm - Pager - [phone]  After 6pm go to [email or web address] - password [email or web address] [facility] [place] [facility]  [phone]  CC: Primary care physician; Maryland Pink, MD

## 2018-04-26 NOTE — Progress Notes (Signed)
Writing RN attempted to call the phone number listed for patient's home and also son Kevan Ny but no answer, and also no answering machine. Pt is retired with no place of employment listed.

## 2018-04-26 NOTE — Progress Notes (Signed)
Patient's friend Glendell Docker came to visit patient, and also son [name] (patient has three sons).  [name] set up a password for the patient and also assisted with completion of admission profile.  Writing RN was approached at the desk by friend Glendell Docker who requested the patient's cell phone.  I told him I could not give him the patient's cell phone without the patient's permission.  He stated that the phone had "some pictures on it people shouldn't see."  I assured him I had not looked at the phone for any reason other than to answer it when it rang d/t not being able to reach patient's son Kevan Ny [facility] name and photo came up on the screen when the phone rang.)  Glendell Docker stated he wasn't worried about the staff so much as "anybody else getting the phone because it might be embarrassing to [facility].  I'll sign for it if you can give it to me"  I told Glendell Docker I could not give him the patient's phone, but I assured him the phone was with the patient's belongings and we won't give it to anyone else either. He verbalized agreement.

## 2018-04-26 NOTE — Progress Notes (Signed)
Reported to [facility] about removal of PIVs. Malachy Mood recommended starting removal of PIV once PICC in use.

## 2018-04-26 NOTE — Progress Notes (Signed)
Writing RN and RN Donah Driver heard patient's cell phone ringing while in patient's room.  We were able to return the call from patient's cell phone and reach son Kevan Ny.  We informed him that his father is in ICU 73 with diagnosis of sepsis/Pna.  He stated he would come to the hospital.  We entered his cell phone number into patient's contact numbers.

## 2018-04-26 NOTE — Consult Note (Signed)
PULMONARY / CRITICAL CARE MEDICINE  Name: Matthew Zamora MRN: [id number] DOB: [birth year]    LOS: 0  Referring Provider: Dr. Duane Boston Reason for Referral: Acute respiratory failure Brief patient description: 77-year-old male, former smoker, history of CHF and CAD presenting with acute dyspnea and agitation.  Intubated for airway protection due to agitation.  Became hypotensive post intubation requiring pressors.  HPI: 77 year old male with a medical history as indicated below who presented to the ED with severe respiratory distress.  When EMS arrived, patient was standing outside with his shirt off, hypoxic and tachypneic but he refused CPAP.  Upon arrival in the ED, patient was hypotensive with systolic blood pressures in the 190s and hypoxic with oxygen saturation in the low 80s.  He was placed on a nonrebreather and attempted on CPAP but patient was very very combative hence he was emergently intubated.  His ED work-up showed leukocytosis blue BC of 13.2, mildly elevated troponin, proBNP of 717, mean of 1.85 and his chest x-ray showed left-sided airspace disease consistent with pneumonia.  He was also hypothermic.  Post intubation ABG showed a pH of 7.25, PCO2 of 54 and a PO2 of 126.  He is being admitted to the ICU for further management  Past Medical History:  Diagnosis Date  . Abdominal aortic aneurysm (AAA) ([place]) 05/13/15   seen on ct scan  . 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 Anemia   . 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 CAD (coronary artery disease)   . Cataract cortical, senile   . CHF (congestive heart failure) ([place])   . Chronic hoarseness   . Exocrine pancreatic insufficiency   . H. pylori infection   . History of hepatitis   . Hyperlipidemia   . Hypertension   . 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 Prostate CA [facility])    Past Surgical History:  Procedure Laterality Date  . CATARACT EXTRACTION    . COLONOSCOPY  10/06/2014, 09/18/2004, 03/14/2009  .  ESOPHAGOGASTRODUODENOSCOPY  10/06/2014, 03/18/2001, 03/14/2009  . FLEXIBLE SIGMOIDOSCOPY  08/26/1990  . INSERTION OF ICD    . PROSTATE SURGERY    . TONSILLECTOMY     Prior to Admission medications   Medication Sig Start Date End Date Taking? Authorizing Provider  amLODipine (NORVASC) 5 MG tablet Take 5 mg by mouth daily.   Yes [provider]  clopidogrel (PLAVIX) 75 MG tablet Take 75 mg by mouth daily.   Yes [provider]  donepezil (ARICEPT) 5 MG tablet Take 1 tablet (5 mg total) by mouth at bedtime. 02/09/18 03/21/18 Yes Sowles, Drue Stager, MD  empagliflozin (JARDIANCE) 25 MG TABS tablet Take 25 mg by mouth daily.   Yes [provider]  glycopyrrolate (ROBINUL) 1 MG tablet Take 1 mg by mouth 2 (two) times daily.   Yes [provider]  insulin aspart (NOVOLOG FLEXPEN) 100 UNIT/ML FlexPen Inject 12 Units into the skin 2 (two) times daily.   Yes [provider]  insulin aspart (NOVOLOG) 100 UNIT/ML FlexPen Inject 18 Units into the skin daily. At 1700   Yes [provider]  Insulin Degludec-Liraglutide (XULTOPHY) 100-3.6 UNIT-MG/ML SOPN Inject 50 Units into the skin daily.   Yes [provider]  levETIRAcetam (KEPPRA) 500 MG tablet Take 500 mg by mouth 2 (two) times daily.   Yes [provider]  lipase/protease/amylase (CREON) 12000 units CPEP capsule Take 6,000 Units by mouth 3 (three) times daily before meals.   Yes [provider]  lipase/protease/amylase (CREON) 12000 units CPEP  capsule Take 3,000 Units by mouth at bedtime. With snack   Yes [provider]  lisinopril (PRINIVIL,ZESTRIL) 5 MG tablet Take 5 mg by mouth daily.   Yes [provider]  metoprolol succinate (TOPROL-XL) 25 MG 24 hr tablet Take 1 tablet (25 mg total) by mouth daily. 02/09/18  Yes Sowles, Drue Stager, MD  rosuvastatin (CRESTOR) 40 MG tablet Take 1 tablet (40 mg total) by mouth daily. 02/09/18 03/21/18 Yes Steele Sizer, MD  aspirin  EC 81 MG tablet Take 81 mg by mouth daily.    [provider]  famotidine (PEPCID) 20 MG tablet Take 1 tablet (20 mg total) by mouth 2 (two) times daily. 02/09/18 03/11/18  Steele Sizer, MD  gabapentin (NEURONTIN) 300 MG capsule Take 1 capsule (300 mg total) by mouth 2 (two) times daily. 02/09/18 03/11/18  Steele Sizer, MD  insulin glargine (LANTUS) 100 UNIT/ML injection Inject 0.1 mLs (10 Units total) into the skin daily. 02/09/18 03/11/18  Steele Sizer, MD  lacosamide 100 MG TABS Take 1 tablet (100 mg total) by mouth 2 (two) times daily. Patient not taking: Reported on 03/21/2018 06/27/17   Fritzi Mandes, MD  promethazine (PHENERGAN) 12.5 MG tablet Take 1 tablet (12.5 mg total) by mouth every 6 (six) hours as needed for nausea or vomiting. Patient not taking: Reported on 03/21/2018 04/15/17   Stark Klein, MD  sertraline (ZOLOFT) 25 MG tablet Take 1 tablet (25 mg total) by mouth daily. Patient not taking: Reported on 03/21/2018 02/09/18   Steele Sizer, MD   Allergies Not on File  Family History Family History  Problem Relation Age of Onset  . Heart attack Mother   . Heart attack Father    Social History  reports that he quit smoking about 18 years ago. His smoking use included cigarettes. He has a 38.00 pack-year smoking history. He has never used smokeless tobacco. He reports that he does not drink alcohol. His drug history is not on file.  Review Of Systems: Unable to obtain as patient is currently intubated and sedated  VITAL SIGNS: BP 103/65   Pulse 78   Temp (!) 96.3 F (35.7 C)   Resp (!) 27   Ht 5\' 11"  (1.803 m)   Wt 76.2 kg   SpO2 95%   BMI 23.43 kg/m   HEMODYNAMICS:    VENTILATOR SETTINGS: Vent Mode: PRVC FiO2 (%):  [60 %] 60 % Set Rate:  [20 bmp] 20 bmp Vt Set:  [400 mL-450 mL] 400 mL PEEP:  [5 cmH20] 5 cmH20 Plateau Pressure:  [18 cmH20] 18 cmH20  INTAKE / OUTPUT: No intake/output data recorded.  PHYSICAL EXAMINATION: General: Intubated and  sedated HEENT: Normocephalic and atraumatic, PERRLA, trachea midline, no JVD Neuro: Withdraws to pain and noxious stimulus, positive gag reflex Cardiovascular: Apical pulse regular, S1-S2, no murmur regurg or gallop, +2 pulses bilaterally, no JVD Lungs: Lateral breath sounds, mild diffuse rhonchi, diminished in the bases Abdomen: Distended, normal bowel sounds in all 4 quadrants Musculoskeletal: No joint deformities Skin: Warm and dry  LABS:  BMET Recent Labs  Lab 04/26/18 0030  NA 135  K 4.0  CL 102  CO2 23  BUN 21  CREATININE 1.85*  GLUCOSE 200*    Electrolytes Recent Labs  Lab 04/26/18 0030  CALCIUM 8.8*    CBC Recent Labs  Lab 04/26/18 0030  WBC 13.2*  HGB 13.0  HCT 40.5  PLT 237    Coag's Recent Labs  Lab 04/26/18 0030  APTT 29  INR 0.99  Sepsis Markers Recent Labs  Lab 04/26/18 0030 04/26/18 0154  LATICACIDVEN  --  1.4  PROCALCITON <0.10  --     ABG Recent Labs  Lab 04/26/18 0102 04/26/18 0407  PHART 7.25* 7.27*  PCO2ART 54* 54*  PO2ART 126* 146*    Liver Enzymes Recent Labs  Lab 04/26/18 0030  AST 24  ALT 16  ALKPHOS 79  BILITOT 0.7  ALBUMIN 4.2    Cardiac Enzymes Recent Labs  Lab 04/26/18 0030  TROPONINI 0.03*    Glucose No results for input(s): GLUCAP in the last 168 hours.  Imaging Dg Chest Portable 1 View  Result Date: 04/26/2018 CLINICAL DATA:  Acute onset of hypotension. Evaluate for pneumothorax. EXAM: PORTABLE CHEST 1 VIEW COMPARISON:  Chest radiograph performed earlier today at 12:42 a.m. FINDINGS: The patient's endotracheal tube is seen ending 4-5 cm above the carina. And enteric tube is noted extending below the diaphragm. No pneumothorax is seen. The hazy left-sided airspace opacity is again noted, which may reflect pneumonia or interstitial edema. No significant pleural effusion or pneumothorax is seen. Vascular congestion is noted. The cardiomediastinal silhouette is normal in size. A pacemaker/AICD is  noted overlying the left chest wall, with leads ending overlying the right atrium and right ventricle. No acute osseous abnormalities are seen. IMPRESSION: 1. No evidence of pneumothorax. 2. Endotracheal tube seen ending 4-5 cm above the carina. 3. Hazy left-sided airspace opacity again noted, which may reflect pneumonia or interstitial edema. Vascular congestion noted. Electronically Signed   By: Garald Balding M.D.   On: 04/26/2018 01:47   Dg Chest Portable 1 View  Result Date: 04/26/2018 CLINICAL DATA:  Endotracheal tube placement and orogastric tube placement. Assess for pneumonia. EXAM: PORTABLE CHEST 1 VIEW COMPARISON:  Chest radiograph performed 10/16/2008 FINDINGS: The patient's endotracheal tube is seen ending 4 cm above the carina. An enteric tube is noted extending below the diaphragm. Patchy left-sided airspace opacity may reflect worsening pneumonia or asymmetric pulmonary edema. No definite pleural effusion or pneumothorax is seen. Underlying vascular congestion is noted. The cardiomediastinal silhouette is normal in size. A pacemaker/AICD is noted overlying the left chest wall, with leads ending overlying the right atrium and right ventricle. No acute osseous abnormalities are identified. IMPRESSION: 1. Endotracheal tube seen ending 4 cm above the carina. 2. Patchy left-sided airspace opacity may reflect worsening pneumonia or asymmetric pulmonary edema. Underlying vascular congestion noted. Electronically Signed   By: Garald Balding M.D.   On: 04/26/2018 01:09   STUDIES:  2D echo pending  CULTURES: Blood cultures x2  ANTIBIOTICS: Ceftriaxone Azithromycin  SIGNIFICANT EVENTS: 11/10 admitted  LINES/TUBES: PIVs  DISCUSSION: 77 year old presenting with acute respiratory failure secondary to community-acquired pneumonia  ASSESSMENT / PLAN:  PULMONARY A: Acute hypoxic and hypercarbic respiratory failure Community-acquired pneumonia P:   Full vent support with current  settings Chest x-ray and ABG PRN next Blietz bronchodilators Antibiotics as above Weaning trials as tolerated  CARDIOVASCULAR A:  Septic shock-exacerbation by potentially effects of sedating medications Mildly elevated troponin-0.16 History of hypertension History of CAD History of CHF AAA diagnosed in 2016 P:  Gentle IV hydration Hemodynamics per ICU protocol Pressors to maintain mean arterial blood pressure greater than 65 PICC line consult Trend cardiac enzymes  RENAL A:   Chronic renal failure stage III P:   BUN/creatinine Avoid nephrotoxic drugs Monitor and correct electrolytes GASTROINTESTINAL A:   History of exocrine pancreatic insufficiency History of liver cyst History of Barrett's esophagitis History of colon polyp History of hepatitis-type unknown  P:   Tonics 40 mg IV daily for GI prophylaxis  HEMATOLOGIC A:   Anemia-likely of chronic disease History of prostate cancer P:  Vitals CBC and transfuse for hemoglobin less than 7  INFECTIOUS A:   Sepsis secondary to community-acquired pneumonia P:   Follow-up blood cultures Antibiotics as above Trend procalcitonin  NEUROLOGIC A:   Acute encephalopathy  P:   RASS goal: 0 to -1 Fentanyl and PRN Versed for vent sedation and comfort Titrate off propofol Monitor neurological status  Best Practice: Code Status: Full code Diet: N.p.o. GI prophylaxis: Protonix 40 mg IV daily VTE prophylaxis: Subcu heparin   FAMILY  - Updates:   Magdalene [facility] ANP-BC Pulmonary and Critical Care Medicine [facility] Pager [phone] or [phone]  NB: This document was prepared using Dragon voice recognition software and may include unintentional dictation errors.    04/26/2018, 4:58 AM

## 2018-04-27 ENCOUNTER — Inpatient Hospital Stay: Payer: Medicare Other

## 2018-04-27 DIAGNOSIS — G9340 Encephalopathy, unspecified: Secondary | ICD-10-CM

## 2018-04-27 DIAGNOSIS — J810 Acute pulmonary edema: Secondary | ICD-10-CM

## 2018-04-27 DIAGNOSIS — R579 Shock, unspecified: Secondary | ICD-10-CM

## 2018-04-27 DIAGNOSIS — J189 Pneumonia, unspecified organism: Secondary | ICD-10-CM

## 2018-04-27 DIAGNOSIS — Z9911 Dependence on respirator [ventilator] status: Secondary | ICD-10-CM

## 2018-04-27 DIAGNOSIS — J9601 Acute respiratory failure with hypoxia: Secondary | ICD-10-CM

## 2018-04-27 LAB — CBC
HEMATOCRIT: 30 % — AB (ref 39.0–52.0)
HEMOGLOBIN: 9.4 g/dL — AB (ref 13.0–17.0)
MCH: 29.6 pg (ref 26.0–34.0)
MCHC: 31.3 g/dL (ref 30.0–36.0)
MCV: 94.3 fL (ref 80.0–100.0)
Platelets: 142 10*3/uL — ABNORMAL LOW (ref 150–400)
RBC: 3.18 MIL/uL — AB (ref 4.22–5.81)
RDW: 13.9 % (ref 11.5–15.5)
WBC: 20.9 10*3/uL — AB (ref 4.0–10.5)
nRBC: 0 % (ref 0.0–0.2)

## 2018-04-27 LAB — BLOOD GAS, ARTERIAL
ACID-BASE DEFICIT: 5.8 mmol/L — AB (ref 0.0–2.0)
Acid-base deficit: 3.4 mmol/L — ABNORMAL HIGH (ref 0.0–2.0)
Bicarbonate: 24.2 mmol/L (ref 20.0–28.0)
Bicarbonate: 25.2 mmol/L (ref 20.0–28.0)
FIO2: 0.4
FIO2: 0.4
LHR: 20 {breaths}/min
MECHVT: 400 mL
O2 SAT: 91.6 %
O2 Saturation: 92.3 %
PATIENT TEMPERATURE: 37
PCO2 ART: 71 mmHg — AB (ref 32.0–48.0)
PEEP: 5 cmH2O
PEEP: 5 cmH2O
PH ART: 7.14 — AB (ref 7.350–7.450)
PO2 ART: 81 mmHg — AB (ref 83.0–108.0)
PO2 ART: 78 mmHg — AB (ref 83.0–108.0)
Patient temperature: 37
RATE: 26 resp/min
pCO2 arterial: 63 mmHg — ABNORMAL HIGH (ref 32.0–48.0)
pH, Arterial: 7.21 — ABNORMAL LOW (ref 7.350–7.450)

## 2018-04-27 LAB — MAGNESIUM
Magnesium: 1.2 mg/dL — ABNORMAL LOW (ref 1.7–2.4)
Magnesium: 1.3 mg/dL — ABNORMAL LOW (ref 1.7–2.4)

## 2018-04-27 LAB — BASIC METABOLIC PANEL WITH GFR
Anion gap: 11 (ref 5–15)
BUN: 18 mg/dL (ref 8–23)
CO2: 18 mmol/L — ABNORMAL LOW (ref 22–32)
Calcium: 5.1 mg/dL — CL (ref 8.9–10.3)
Chloride: 104 mmol/L (ref 98–111)
Creatinine, Ser: 1.26 mg/dL — ABNORMAL HIGH (ref 0.61–1.24)
GFR calc Af Amer: >60 mL/min
GFR calc non Af Amer: 54 mL/min — ABNORMAL LOW
Glucose, Bld: 297 mg/dL — ABNORMAL HIGH (ref 70–99)
Potassium: 2.9 mmol/L — ABNORMAL LOW (ref 3.5–5.1)
Sodium: 133 mmol/L — ABNORMAL LOW (ref 135–145)

## 2018-04-27 LAB — BASIC METABOLIC PANEL
Anion gap: 11 (ref 5–15)
BUN: 25 mg/dL — ABNORMAL HIGH (ref 8–23)
CO2: 21 mmol/L — ABNORMAL LOW (ref 22–32)
CREATININE: 1.81 mg/dL — AB (ref 0.61–1.24)
Calcium: 6.8 mg/dL — ABNORMAL LOW (ref 8.9–10.3)
Chloride: 101 mmol/L (ref 98–111)
GFR, EST AFRICAN AMERICAN: 40 mL/min — AB (ref >60)
GFR, EST NON AFRICAN AMERICAN: 35 mL/min — AB (ref >60)
Glucose, Bld: 152 mg/dL — ABNORMAL HIGH (ref 70–99)
POTASSIUM: 4 mmol/L (ref 3.5–5.1)
SODIUM: 133 mmol/L — AB (ref 135–145)

## 2018-04-27 LAB — GLUCOSE, CAPILLARY
GLUCOSE-CAPILLARY: 149 mg/dL — AB (ref 70–99)
GLUCOSE-CAPILLARY: 113 mg/dL — AB (ref 70–99)

## 2018-04-27 LAB — TRIGLYCERIDES
TRIGLYCERIDES: 155 mg/dL — AB (ref <150)
Triglycerides: 91 mg/dL
Triglycerides: 611 mg/dL — ABNORMAL HIGH (ref <150)

## 2018-04-27 LAB — PHOSPHORUS: Phosphorus: 2.4 mg/dL — ABNORMAL LOW (ref 2.5–4.6)

## 2018-04-27 LAB — STREP PNEUMONIAE URINARY ANTIGEN: Strep Pneumo Urinary Antigen: NEGATIVE

## 2018-04-27 LAB — TROPONIN I
TROPONIN I: 0.43 ng/mL — AB (ref <0.03)
TROPONIN I: 0.57 ng/mL — AB (ref <0.03)

## 2018-04-27 LAB — ECHOCARDIOGRAM COMPLETE
HEIGHTINCHES: 71 in
Weight: 2687.85 oz

## 2018-04-27 LAB — BRAIN NATRIURETIC PEPTIDE: B Natriuretic Peptide: 799 pg/mL — ABNORMAL HIGH (ref 0.0–100.0)

## 2018-04-27 MED ORDER — ATORVASTATIN CALCIUM 20 MG PO TABS
40.0000 mg | ORAL_TABLET | Freq: Every day | ORAL | Status: DC
  Administered 2018-05-01 – 2018-05-04 (×4): 40 mg
  Filled 2018-04-27 (×4): qty 2

## 2018-04-27 MED ORDER — AMIODARONE HCL IN DEXTROSE 360-4.14 MG/200ML-% IV SOLN
60.0000 mg/h | INTRAVENOUS | Status: DC
  Administered 2018-04-27: 60 mg/h via INTRAVENOUS

## 2018-04-27 MED ORDER — SODIUM CHLORIDE 0.9 % IV SOLN
500.0000 mg | INTRAVENOUS | Status: DC
  Administered 2018-04-28: 500 mg via INTRAVENOUS
  Filled 2018-04-27: qty 500

## 2018-04-27 MED ORDER — DOCUSATE SODIUM 50 MG/5ML PO LIQD
100.0000 mg | Freq: Two times a day (BID) | ORAL | Status: DC
  Administered 2018-04-27: 100 mg
  Filled 2018-04-27: qty 10

## 2018-04-27 MED ORDER — PHENYLEPHRINE HCL-NACL 10-0.9 MG/250ML-% IV SOLN
0.0000 ug/min | INTRAVENOUS | Status: DC
  Administered 2018-04-27 (×2): 100 ug/min via INTRAVENOUS
  Administered 2018-04-27: 120 ug/min via INTRAVENOUS
  Filled 2018-04-27 (×4): qty 250

## 2018-04-27 MED ORDER — METOPROLOL TARTRATE 5 MG/5ML IV SOLN
2.5000 mg | INTRAVENOUS | Status: DC | PRN
  Administered 2018-04-29: 5 mg via INTRAVENOUS
  Filled 2018-04-27 (×3): qty 5

## 2018-04-27 MED ORDER — FENTANYL CITRATE (PF) 100 MCG/2ML IJ SOLN
100.0000 ug | INTRAMUSCULAR | Status: DC | PRN
  Administered 2018-04-27: 50 ug via INTRAVENOUS
  Filled 2018-04-27: qty 2

## 2018-04-27 MED ORDER — FENTANYL CITRATE (PF) 100 MCG/2ML IJ SOLN: 100.0000 ug | INTRAMUSCULAR | Status: DC | PRN

## 2018-04-27 MED ORDER — VECURONIUM BROMIDE 10 MG IV SOLR
10.0000 mg | Freq: Once | INTRAVENOUS | Status: AC
  Administered 2018-04-27: 10 mg via INTRAVENOUS
  Filled 2018-04-27: qty 10

## 2018-04-27 MED ORDER — FUROSEMIDE 10 MG/ML IJ SOLN
40.0000 mg | Freq: Once | INTRAMUSCULAR | Status: AC
  Administered 2018-04-27: 40 mg via INTRAVENOUS
  Filled 2018-04-27: qty 4

## 2018-04-27 MED ORDER — STERILE WATER FOR INJECTION IJ SOLN
INTRAMUSCULAR | Status: AC
  Filled 2018-04-27: qty 10

## 2018-04-27 MED ORDER — PANCRELIPASE (LIP-PROT-AMYL) 12000-38000 UNITS PO CPEP
12000.0000 [IU] | ORAL_CAPSULE | Freq: Three times a day (TID) | ORAL | Status: DC
  Administered 2018-05-01 – 2018-05-06 (×14): 12000 [IU] via ORAL
  Filled 2018-04-27 (×17): qty 1

## 2018-04-27 MED ORDER — IPRATROPIUM-ALBUTEROL 0.5-2.5 (3) MG/3ML IN SOLN: 3.0000 mL | RESPIRATORY_TRACT | Status: DC | PRN

## 2018-04-27 MED ORDER — PANTOPRAZOLE SODIUM 40 MG PO PACK
40.0000 mg | PACK | Freq: Every day | ORAL | Status: DC
  Administered 2018-04-27: 40 mg
  Filled 2018-04-27: qty 20

## 2018-04-27 MED ORDER — PRO-STAT SUGAR FREE PO LIQD: 30.0000 mL | Freq: Three times a day (TID) | ORAL | Status: DC

## 2018-04-27 MED ORDER — ACETAMINOPHEN 650 MG RE SUPP: 650.0000 mg | Freq: Four times a day (QID) | RECTAL | Status: DC | PRN

## 2018-04-27 MED ORDER — PRO-STAT SUGAR FREE PO LIQD
30.0000 mL | Freq: Two times a day (BID) | ORAL | Status: DC
  Administered 2018-04-27: 30 mL

## 2018-04-27 MED ORDER — STERILE WATER FOR INJECTION IJ SOLN
INTRAMUSCULAR | Status: AC
  Administered 2018-04-27: 10 mL
  Filled 2018-04-27: qty 10

## 2018-04-27 MED ORDER — ACETAMINOPHEN 325 MG PO TABS
650.0000 mg | ORAL_TABLET | Freq: Four times a day (QID) | ORAL | Status: DC | PRN
  Administered 2018-04-27: 650 mg
  Filled 2018-04-27: qty 2

## 2018-04-27 MED ORDER — ASPIRIN 81 MG PO CHEW
81.0000 mg | CHEWABLE_TABLET | Freq: Every day | ORAL | Status: DC
  Administered 2018-04-27 – 2018-05-05 (×6): 81 mg
  Filled 2018-04-27 (×6): qty 1

## 2018-04-27 MED ORDER — VITAL HIGH PROTEIN PO LIQD
1000.0000 mL | ORAL | Status: DC
  Administered 2018-04-27: 1000 mL

## 2018-04-27 MED ORDER — AMIODARONE LOAD VIA INFUSION
150.0000 mg | Freq: Once | INTRAVENOUS | Status: AC
  Administered 2018-04-27: 150 mg via INTRAVENOUS
  Filled 2018-04-27: qty 83.34

## 2018-04-27 MED ORDER — SODIUM CHLORIDE 0.9 % IV SOLN
0.0000 ug/min | INTRAVENOUS | Status: DC
  Administered 2018-04-27: 130 ug/min via INTRAVENOUS
  Administered 2018-04-27: 190 ug/min via INTRAVENOUS
  Administered 2018-04-28: 130 ug/min via INTRAVENOUS
  Administered 2018-04-28: 140 ug/min via INTRAVENOUS
  Administered 2018-04-28: 150 ug/min via INTRAVENOUS
  Administered 2018-04-28: 250 ug/min via INTRAVENOUS
  Administered 2018-04-28: 160 ug/min via INTRAVENOUS
  Administered 2018-04-28: 120 ug/min via INTRAVENOUS
  Administered 2018-04-29: 100 ug/min via INTRAVENOUS
  Administered 2018-04-29: 20 ug/min via INTRAVENOUS
  Filled 2018-04-27: qty 40
  Filled 2018-04-27: qty 4
  Filled 2018-04-27 (×3): qty 40
  Filled 2018-04-27: qty 4
  Filled 2018-04-27 (×4): qty 40

## 2018-04-27 MED ORDER — PROPOFOL 1000 MG/100ML IV EMUL
0.0000 ug/kg/min | INTRAVENOUS | Status: DC
  Administered 2018-04-27: 10 ug/kg/min via INTRAVENOUS
  Administered 2018-04-27: 30 ug/kg/min via INTRAVENOUS
  Administered 2018-04-28: 25 ug/kg/min via INTRAVENOUS
  Administered 2018-04-28 (×2): 40 ug/kg/min via INTRAVENOUS
  Administered 2018-04-28: 35 ug/kg/min via INTRAVENOUS
  Administered 2018-04-29 (×2): 40 ug/kg/min via INTRAVENOUS
  Filled 2018-04-27 (×7): qty 100

## 2018-04-27 MED ORDER — AMIODARONE HCL IN DEXTROSE 360-4.14 MG/200ML-% IV SOLN
60.0000 mg/h | INTRAVENOUS | Status: DC
  Administered 2018-04-27 – 2018-04-29 (×4): 30 mg/h via INTRAVENOUS
  Administered 2018-04-29 – 2018-05-08 (×33): 60 mg/h via INTRAVENOUS
  Filled 2018-04-27 (×39): qty 200

## 2018-04-27 MED ORDER — PROPOFOL 1000 MG/100ML IV EMUL
INTRAVENOUS | Status: AC
  Administered 2018-04-27: 10 ug/kg/min via INTRAVENOUS
  Filled 2018-04-27: qty 100

## 2018-04-27 MED ORDER — VITAL 1.5 CAL PO LIQD
1000.0000 mL | ORAL | Status: DC
  Administered 2018-04-27: 1000 mL

## 2018-04-27 MED ORDER — CLOPIDOGREL BISULFATE 75 MG PO TABS
75.0000 mg | ORAL_TABLET | Freq: Every day | ORAL | Status: DC
  Administered 2018-04-27 – 2018-05-05 (×6): 75 mg
  Filled 2018-04-27 (×6): qty 1

## 2018-04-27 NOTE — Progress Notes (Signed)
Initial Nutrition Assessment  DOCUMENTATION CODES:   Not applicable  INTERVENTION:  Initiate Vital 1.5 at 50 mL/hr (1200 mL goal daily volume) + Pro-Stat 30 mL TID via OGT. Provides 2100 kcal, 126 grams of protein, 912 mL H2O daily.  Provide free water flush of 30 mL Q4hrs to maintain tube patency.  Patient is on Creon for exocrine pancreatic insufficiency. To provide per tube recommend opening capsule and pouring beads into 50 mL of nectar-thick apple juice. Provide mixture by gravity with a 60 mL syringe. Flush syringe with 50-60 mL free water before and after Creon provision to help maintain tube patency.  NUTRITION DIAGNOSIS:   Inadequate oral intake related to inability to eat as evidenced by NPO status.  GOAL:   Provide needs based on ASPEN/SCCM guidelines  MONITOR:   Vent status, Labs, Weight trends, TF tolerance, I & O's  REASON FOR ASSESSMENT:   Ventilator, Consult Enteral/tube feeding initiation and management  ASSESSMENT:   77 year old male with PMHx of prostate cancer s/p surgery, HTN, anemia, Barrett esophagus, CKD stage III, CAD, CHF, HLD, exocrine pancreatic insufficiency on Creon, AAA who is admitted with acute respiratory failure secondary to PNA requiring intubation on 11/10, also with septic shock.   Patient intubated and sedated. On PRVC mode with FiO2 35% and PEEP 5 cmH2O. Abdomen is soft. Patient had a small type 3 BM yesterday. Weight appears stable in chart. Patient is currently 82.8 kg (182.54 lbs).  Enteral Access: OGT placed 11/10; extends below diaphragm per chest x-ray 11/10; 55 cm at corner of mouth  MAP: 58-83 mmHg  Patient is currently intubated on ventilator support Ve: 11 L/min Temp (24hrs), Avg:99.2 F (37.3 C), Min:97.3 F (36.3 C), Max:101.3 F (38.5 C)  Propofol: N/A  Medications reviewed and include: amiodarone, Protonix per tube, azithromycin, ceftriaxone, fentanyl gtt, norepinephrine gtt at 16 mcg/min.  Labs reviewed: CBG  113. Still pending BMP from today.  I/O: 1100 mL UOP yesterday (0.6 mL/kg/hr)  Patient does not meet criteria for malnutrition at this time.  Discussed with RN and on rounds. Plan is to start tube feeds today. Switching from norepinephrine gtt to phenylephrine gtt today.  NUTRITION - FOCUSED PHYSICAL EXAM:    Most Recent Value  Orbital Region  No depletion  Upper Arm Region  Mild depletion  Thoracic and Lumbar Region  No depletion  Buccal Region  Unable to assess  Temple Region  Mild depletion  Clavicle Bone Region  No depletion  Clavicle and Acromion Bone Region  No depletion  Scapular Bone Region  Unable to assess  Dorsal Hand  No depletion  Patellar Region  No depletion  Anterior Thigh Region  No depletion  Posterior Calf Region  Mild depletion  Edema (RD Assessment)  None  Hair  Reviewed  Eyes  Unable to assess  Mouth  Unable to assess  Skin  Reviewed  Nails  Reviewed     Diet Order:   Diet Order    None     EDUCATION NEEDS:   No education needs have been identified at this time  Skin:  Skin Assessment: Reviewed RN Assessment  Last BM:  04/26/2018 - small type 3  Height:   Ht Readings from Last 1 Encounters:  04/26/18 5\' 11"  (1.803 m)   Weight:   Wt Readings from Last 1 Encounters:  04/27/18 82.8 kg   Ideal Body Weight:  78.2 kg  BMI:  Body mass index is 25.46 kg/m.  Estimated Nutritional Needs:   Kcal:  2080 (PSU 2003b w/ MSJ 1585, Ve 11, Tmax 38.5)  Protein:  100-125 grams (1.2-1.5 grams/kg)  Fluid:  2 L/day (25 mL/kg)  Willey Blade, MS, RD, LDN Office: [phone] Pager: [phone] After Hours/Weekend Pager: [phone]

## 2018-04-27 NOTE — Progress Notes (Addendum)
MEDICATION RELATED CONSULT NOTE - INITIAL   Pharmacy Consult for amiodarone DDIs  No Known Allergies  Patient Measurements: Height: 5\' 11"  (180.3 cm) Weight: 182 lb 8.7 oz (82.8 kg) IBW/kg (Calculated) : 75.3 Adjusted Body Weight: 78.3  Vital Signs: Temp: 98.8 F (37.1 C) (11/11 0800) Temp Source: Bladder (11/11 0800) BP: 111/52 (11/11 0800) Pulse Rate: 94 (11/11 0800) Intake/Output from previous day: 11/10 0701 - 11/11 0700 In: 4474.7 [I.V.:4314.7; NG/GT:60; IV Piggyback:100] Out: 1200 [Urine:1100; Emesis/NG output:100] Intake/Output from this shift: Total I/O In: -  Out: 650 [Urine:650]  Labs: Recent Labs    04/26/18 0030 04/26/18 0447 04/26/18 1835 04/27/18 0948  WBC 13.2* 14.8*  --  20.9*  HGB 13.0 10.9*  --  9.4*  HCT 40.5 35.4*  --  30.0*  PLT 237 191  --  142*  APTT 29  --   --   --   CREATININE 1.85* 1.73*  --   --   MG  --   --  1.9  --   ALBUMIN 4.2  --   --   --   PROT 7.5  --   --   --   AST 24  --   --   --   ALT 16  --   --   --   ALKPHOS 79  --   --   --   BILITOT 0.7  --   --   --    Estimated Creatinine Clearance: 38.7 mL/min (A) (by C-G formula based on SCr of 1.73 mg/dL (H)).   Microbiology: Recent Results (from the past 720 hour(s))  Blood culture (routine x 2)     Status: None (Preliminary result)   Collection Time: 04/26/18  1:54 AM  Result Value Ref Range Status   Specimen Description BLOOD LEFT FATTY CASTS  Final   Special Requests   Final    BOTTLES DRAWN AEROBIC AND ANAEROBIC Blood Culture adequate volume   Culture   Final    NO GROWTH 1 DAY Performed at [facility], [address] [place], [place] [address]    Report Status PENDING  Incomplete  Blood culture (routine x 2)     Status: None (Preliminary result)   Collection Time: 04/26/18  1:55 AM  Result Value Ref Range Status   Specimen Description BLOOD LEFT ASSIST CONTROL  Final   Special Requests   Final    BOTTLES DRAWN AEROBIC AND ANAEROBIC Blood Culture  results may not be optimal due to an excessive volume of blood received in culture bottles   Culture   Final    NO GROWTH 1 DAY Performed at [facility], [address] [place], [place] [address]    Report Status PENDING  Incomplete  MRSA PCR Screening     Status: None   Collection Time: 04/26/18  2:54 AM  Result Value Ref Range Status   MRSA by PCR NEGATIVE NEGATIVE Final    Comment:        The GeneXpert MRSA Assay (FDA approved for NASAL specimens only), is one component of a comprehensive MRSA colonization surveillance program. It is not intended to diagnose MRSA infection nor to guide or monitor treatment for MRSA infections. Performed at [facility], [address], [place], [place] [address]     Medical History: Past Medical History:  Diagnosis Date  . Abdominal aortic aneurysm (AAA) ([place]) 05/13/15   seen on ct scan  . 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  Anemia   . 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 CAD (coronary artery disease)   . Cataract cortical, senile   . CHF (congestive heart failure) ([place])   . Chronic hoarseness   . Exocrine pancreatic insufficiency   . H. pylori infection   . History of hepatitis   . Hyperlipidemia   . Hypertension   . 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 Prostate CA [facility])       Assessment/Plan  Class D DDIs identified among patient's scheduled/PRN medications and amiodarone. Patient is taking multiple QTc-prolonging agents (azithromycin, ondansetron) with amiodarone. QT interval is 440 on ECG 11/10.   Consider switching azithromycin 500 mg IV q24h to doxycycline 100 mg BID for pneumonia. Consider discontinuing ondansetron injection 4 mg IV q6h prn and ondansetron tablet 4 mg PO q6h prn.    [place] Student 04/27/2018,12:05 PM

## 2018-04-27 NOTE — Progress Notes (Signed)
Pharmacy Antibiotic Note  MELANIE PELLOT is a 77 y.o. male admitted on 04/26/2018 with pneumonia.  Pharmacy has been consulted for ceftriaxone dosing.  Plan: Will continue ceftriaxone 2g IV daily for acute respiratory failure s/t CAP  Height: 5\' 11"  (180.3 cm) Weight: 182 lb 8.7 oz (82.8 kg) IBW/kg (Calculated) : 75.3  Temp (24hrs), Avg:99.2 F (37.3 C), Min:97.3 F (36.3 C), Max:101.3 F (38.5 C)  Recent Labs  Lab 04/26/18 0030 04/26/18 0154 04/26/18 0447 04/27/18 0948  WBC 13.2*  --  14.8* 20.9*  CREATININE 1.85*  --  1.73*  --   LATICACIDVEN  --  1.4 1.6  --     Estimated Creatinine Clearance: 38.7 mL/min (A) (by C-G formula based on SCr of 1.73 mg/dL (H)).    No Known Allergies  Thank you for allowing pharmacy to be a part of this patient's care.  [place] Student  04/27/2018

## 2018-04-27 NOTE — Progress Notes (Signed)
Patient has been tolerating sedation most of the night, had to have a dose of Versed x 1 when he was asynchrony with the vent. Temp has been up and down this shift. Medicated with Tylenol x 1 through OG. NP discontinued NS maintenance fluid and ordered 40 Lasix IV this AM.

## 2018-04-27 NOTE — Progress Notes (Signed)
Patient ID: Matthew Zamora, male   DOB: 1941-02-27, 77 y.o.   MRN: [id number]  [employer] PROGRESS NOTE  Matthew Zamora RKY:[id number] DOB: 1941/01/06 DOA: 04/26/2018 PCP: [place] Pink, MD  HPI/Subjective: Patient intubated and sedated.  Nursing staff and family at the bedside stated that the patient did sit up and was very agitated when awakened.  Patient had a be re-sedated.  When I saw him he was resuscitated.  Objective: Vitals:   04/27/18 0810 04/27/18 1233  BP:    Pulse:    Resp:    Temp:    SpO2: 95% 95%    Filed Weights   04/26/18 0057 04/26/18 0300 04/27/18 0500  Weight: 81 kg 76.2 kg 82.8 kg    ROS: Review of Systems  Unable to perform ROS: Intubated   Exam: Physical Exam  Constitutional: He is intubated.  HENT:  Nose: No mucosal edema.  Unable to look into mouth  Eyes: Conjunctivae and lids are normal.  Pupils sluggish  Neck: Carotid bruit is not present. No thyromegaly present.  Cardiovascular: S1 normal, S2 normal and normal heart sounds. An irregularly irregular rhythm present. Tachycardia present.  Respiratory: He is intubated. He has decreased breath sounds in the right lower field and the left lower field. He has no wheezes. He has rhonchi in the right lower field and the left lower field. He has no rales.  GI: Soft. Bowel sounds are normal. There is no tenderness.  Musculoskeletal:       Right ankle: He exhibits no swelling.       Left ankle: He exhibits no swelling.  Neurological:  Intubated and sedated  Skin: Skin is warm. Rash noted. Nails show no clubbing.  Psychiatric:  Unable to assess secondary to being intubated and sedated      Data Reviewed: Basic Metabolic Panel: Recent Labs  Lab 04/26/18 0030 04/26/18 0447 04/26/18 1835  NA 135 138  --   K 4.0 4.3 4.2  CL 102 104  --   CO2 23 25  --   GLUCOSE 200* 164*  --   BUN 21 22  --   CREATININE 1.85* 1.73*  --   CALCIUM 8.8* 8.3*  --   MG  --   --  1.9   Liver  Function Tests: Recent Labs  Lab 04/26/18 0030  AST 24  ALT 16  ALKPHOS 79  BILITOT 0.7  PROT 7.5  ALBUMIN 4.2   CBC: Recent Labs  Lab 04/26/18 0030 04/26/18 0447 04/27/18 0948  WBC 13.2* 14.8* 20.9*  NEUTROABS 7.7  --   --   HGB 13.0 10.9* 9.4*  HCT 40.5 35.4* 30.0*  MCV 91.4 93.2 94.3  PLT 237 191 142*   Cardiac Enzymes: Recent Labs  Lab 04/26/18 0030 04/26/18 0447  TROPONINI 0.03* 0.16*   BNP (last 3 results) Recent Labs    04/26/18 0030 04/27/18 1054  BNP 717.0* 799.0*    CBG: Recent Labs  Lab 04/26/18 0245 04/26/18 0735 04/27/18 0739  GLUCAP 149* 126* 113*    Recent Results (from the past 240 hour(s))  Blood culture (routine x 2)     Status: None (Preliminary result)   Collection Time: 04/26/18  1:54 AM  Result Value Ref Range Status   Specimen Description BLOOD LEFT FATTY CASTS  Final   Special Requests   Final    BOTTLES DRAWN AEROBIC AND ANAEROBIC Blood Culture adequate volume   Culture   Final    NO GROWTH 1 DAY Performed  at [facility], [address], [place], [place] [address]    Report Status PENDING  Incomplete  Blood culture (routine x 2)     Status: None (Preliminary result)   Collection Time: 04/26/18  1:55 AM  Result Value Ref Range Status   Specimen Description BLOOD LEFT ASSIST CONTROL  Final   Special Requests   Final    BOTTLES DRAWN AEROBIC AND ANAEROBIC Blood Culture results may not be optimal due to an excessive volume of blood received in culture bottles   Culture   Final    NO GROWTH 1 DAY Performed at [facility], [address] [place], [place] [address]    Report Status PENDING  Incomplete  MRSA PCR Screening     Status: None   Collection Time: 04/26/18  2:54 AM  Result Value Ref Range Status   MRSA by PCR NEGATIVE NEGATIVE Final    Comment:        The GeneXpert MRSA Assay (FDA approved for NASAL specimens only), is one component of a comprehensive MRSA colonization surveillance  program. It is not intended to diagnose MRSA infection nor to guide or monitor treatment for MRSA infections. Performed at [facility], [address] [place], [place] [address]   Culture, respiratory (non-expectorated)     Status: None (Preliminary result)   Collection Time: 04/27/18 10:06 AM  Result Value Ref Range Status   Specimen Description   Final    TRACHEAL ASPIRATE Performed at [facility], [address], [place], [place] [address]    Special Requests   Final    NONE Performed at [facility], [address] [place], [place] [address]    Gram Stain   Final    FEW WBC PRESENT, PREDOMINANTLY PMN RARE GRAM POSITIVE RODS Performed at [facility], [place] [address] [place], [place] [address]    Culture PENDING  Incomplete   Report Status PENDING  Incomplete     Studies: Portable Chest Xray  Result Date: 04/27/2018 CLINICAL DATA:  Acute respiratory failure EXAM: PORTABLE CHEST 1 VIEW COMPARISON:  04/26/2018 FINDINGS: Endotracheal tube and nasogastric catheter are again seen in satisfactory position. Defibrillator is again noted and stable. Right-sided PICC line has been placed in the interval in satisfactory position. Increasing density is noted over the bases bilaterally consistent with small posteriorly layering effusions. These are slightly accentuated due to the patient's positioning. Stable parenchymal opacities are noted in the left base. No pneumothorax is noted. IMPRESSION: Stable parenchymal infiltrate in the left base. Small effusions are noted. Right-sided PICC line in satisfactory position. Electronically Signed   By: Inez Catalina M.D.   On: 04/27/2018 07:07   Dg Chest Portable 1 View  Result Date: 04/26/2018 CLINICAL DATA:  Acute onset of hypotension. Evaluate for pneumothorax. EXAM: PORTABLE CHEST 1 VIEW COMPARISON:  Chest radiograph performed earlier today at 12:42 a.m. FINDINGS: The patient's endotracheal tube is seen  ending 4-5 cm above the carina. And enteric tube is noted extending below the diaphragm. No pneumothorax is seen. The hazy left-sided airspace opacity is again noted, which may reflect pneumonia or interstitial edema. No significant pleural effusion or pneumothorax is seen. Vascular congestion is noted. The cardiomediastinal silhouette is normal in size. A pacemaker/AICD is noted overlying the left chest wall, with leads ending overlying the right atrium and right ventricle. No acute osseous abnormalities are seen. IMPRESSION: 1. No evidence of pneumothorax. 2. Endotracheal tube seen ending 4-5 cm above the carina. 3.  Hazy left-sided airspace opacity again noted, which may reflect pneumonia or interstitial edema. Vascular congestion noted. Electronically Signed   By: Garald Balding M.D.   On: 04/26/2018 01:47   Dg Chest Portable 1 View  Result Date: 04/26/2018 CLINICAL DATA:  Endotracheal tube placement and orogastric tube placement. Assess for pneumonia. EXAM: PORTABLE CHEST 1 VIEW COMPARISON:  Chest radiograph performed 10/16/2008 FINDINGS: The patient's endotracheal tube is seen ending 4 cm above the carina. An enteric tube is noted extending below the diaphragm. Patchy left-sided airspace opacity may reflect worsening pneumonia or asymmetric pulmonary edema. No definite pleural effusion or pneumothorax is seen. Underlying vascular congestion is noted. The cardiomediastinal silhouette is normal in size. A pacemaker/AICD is noted overlying the left chest wall, with leads ending overlying the right atrium and right ventricle. No acute osseous abnormalities are identified. IMPRESSION: 1. Endotracheal tube seen ending 4 cm above the carina. 2. Patchy left-sided airspace opacity may reflect worsening pneumonia or asymmetric pulmonary edema. Underlying vascular congestion noted. Electronically Signed   By: Garald Balding M.D.   On: 04/26/2018 01:09   [place] Ekg Site Rite  Result Date: 04/26/2018 If Site Rite  image not attached, placement could not be confirmed due to current cardiac rhythm.   Scheduled Meds: . aspirin  81 mg Per Tube Daily  . atorvastatin  40 mg Per Tube Daily  . chlorhexidine gluconate (MEDLINE KIT)  15 mL Mouth Rinse BID  . clopidogrel  75 mg Per Tube Daily  . feeding supplement (PRO-STAT SUGAR FREE 64)  30 mL Per Tube TID  . heparin  5,000 Units Subcutaneous Q8H  . lipase/protease/amylase  12,000 Units Oral Q8H  . mouth rinse  15 mL Mouth Rinse 10 times per day  . pantoprazole sodium  40 mg Per Tube Daily  . sodium chloride flush  10-40 mL Intracatheter Q12H   Continuous Infusions: . amiodarone 60 mg/hr (04/27/18 1102)   Followed by  . amiodarone    . [START ON 04/28/2018] azithromycin    . cefTRIAXone (ROCEPHIN)  IV Stopped (04/26/18 2154)  . feeding supplement (VITAL 1.5 CAL) 1,000 mL (04/27/18 1159)  . fentaNYL infusion INTRAVENOUS 350 mcg/hr (04/27/18 0405)  . norepinephrine (LEVOPHED) Adult infusion Stopped (04/27/18 1113)  . phenylephrine (NEO-SYNEPHRINE) Adult infusion 100 mcg/min (04/27/18 1332)  . propofol (DIPRIVAN) infusion 10 mcg/kg/min (04/27/18 1249)    Assessment/Plan:  1. Septic shock secondary to pneumonia.  Patient was temporarily taken off pressors because with the agitation the blood pressure was up.  Patient on Rocephin and Zithromax.  Can consider changing Zithromax over to doxycycline since the patient is now on amiodarone. 2. Acute hypoxic respiratory failure.  Currently on the vent 30%.  Patient needed to be re-sedated secondary to agitation. 3. Atrial fibrillation with rapid ventricular response placed on amiodarone drip.  Echocardiogram showed an EF of 40 to 45%. 4. Elevated troponin secondary to demand ischemia with septic shock.  5. Chronic kidney disease stage III 6. Pancreatic insufficiency on pancreatic enzymes 7. Anemia of chronic disease 8. Hyperlipidemia unspecified on Lipitor 9. History of prostate cancer 10. 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  Code Status:     Code Status Orders  (From admission, onward)         Start     Ordered   04/26/18 0254  Full code  Continuous     04/26/18 0253        Code Status History    This patient has a current  code status but no historical code status.     Family Communication: Spoke with family at the bedside Disposition Plan: To be determined  Consultants:  Critical care specialist  Antibiotics: -Rocephin and Zithromax  Time spent: 28 minutes  [place]

## 2018-04-27 NOTE — Progress Notes (Addendum)
PHARMACIST - PHYSICIAN COMMUNICATION  CONCERNING: IV to Oral Route Change Policy  RECOMMENDATION: This patient is receiving pantoprazole by the intravenous route.  Based on criteria approved by the Pharmacy and Therapeutics Committee, the intravenous medication(s) is/are being converted to the equivalent oral dose form(s).   DESCRIPTION: These criteria include:  The patient is eating (either orally or via tube) and/or has been taking other orally administered medications for a least 24 hours  The patient has no evidence of active gastrointestinal bleeding or impaired GI absorption (gastrectomy, short bowel, patient on TNA or NPO).  If you have questions about this conversion, please contact the Pharmacy Department  []   [phone] )  [facility] [x]   [phone] )  [facility] []   [phone] )  [facility] []   [phone] )  [facility] []   [phone] )  [place], [facility] 04/27/2018 9:16 AM

## 2018-04-27 NOTE — Progress Notes (Signed)
PULMONARY / CRITICAL CARE MEDICINE   NAME:  Matthew Zamora, MRN:  [id number], DOB:  29-Jan-1941, LOS: 1 ADMISSION DATE:  04/26/2018      PT PROFILE:    52 M adm via ED with acute AMS/delirium, hypoxemia, tachypnea, labile BP, leukocytosis, suspected pneumonia.  Developed AFRVR on day following admission.   SIGNIFICANT STUDIES/EVENTS:  11/10 Echcoardiogram: LVEF 40-45%.  Mild MR.  LA moderately dilated.  RVSP estimate 50 mmHg 11/11 Developed AFRVR. NE changed to PE. Amiodarone infusion intitiated.  11/11 Severe intermittent agitation. Propofol initiated   CULTURES:  MRSA PCR 11/10 >> NEG Resp 11/11 >>  Blood 11/10 >>  Urine strep Ag 11/11 >>  Urine Legionella Ag 11/11 >>   ANTIBIOTICS:  Azithromycin 11/10 >>  Ceftriaxone 11/10 >>     LINES/TUBES:  ETT 11/10 >>  RUE PICC 11/10 >>   CONSULTANTS:     SUBJECTIVE:  Intermittent agitation.  When calm, synchronous with mechanical ventilation.  Not F/C.  CONSTITUTIONAL: BP (!) 111/52 (BP Location: Left Leg)   Pulse 94   Temp 98.8 F (37.1 C) (Bladder)   Resp (!) 23   Ht 5\' 11"  (1.803 m)   Wt 82.8 kg   SpO2 95%   BMI 25.46 kg/m   I/O last 3 completed shifts: In: 4989.1 [I.V.:4581.4; NG/GT:60; IV Piggyback:347.7] Out: 1850 [Urine:1750; Emesis/NG output:100]     Vent Mode: PRVC FiO2 (%):  [35 %-40 %] 35 % Set Rate:  [16 bmp-26 bmp] 16 bmp Vt Set:  [400 mL-4008 mL] 400 mL PEEP:  [5 cmH20] 5 cmH20 Plateau Pressure:  [18 cmH20-20 cmH20] 20 cmH20  PHYSICAL EXAM: General: Intubated, sedated, not F/C Neuro: CNs intact, moves all extremities HEENT: NCAT, sclerae white Cardiovascular: IRIR, tachy, no M noted Lungs: Scattered bilateral rhonchi, no wheezes Abdomen: soft, NT, + BS Ext: warm, no edema Skin: No lesions noted  BMP Latest Ref Rng & Units 04/26/2018 04/26/2018 04/26/2018  Glucose 70 - 99 mg/dL - 164(H) 200(H)  BUN 8 - 23 mg/dL - 22 21  Creatinine 0.61 - 1.24 mg/dL - 1.73(H) 1.85(H)  Sodium 135 -  145 mmol/L - 138 135  Potassium 3.5 - 5.1 mmol/L 4.2 4.3 4.0  Chloride 98 - 111 mmol/L - 104 102  CO2 22 - 32 mmol/L - 25 23  Calcium 8.9 - 10.3 mg/dL - 8.3(L) 8.8(L)   CBC Latest Ref Rng & Units 04/27/2018 04/26/2018 04/26/2018  WBC 4.0 - 10.5 K/uL 20.9(H) 14.8(H) 13.2(H)  Hemoglobin 13.0 - 17.0 g/dL 9.4(L) 10.9(L) 13.0  Hematocrit 39.0 - 52.0 % 30.0(L) 35.4(L) 40.5  Platelets 150 - 400 K/uL 142(L) 191 237    Cardiac Panel (last 3 results) Recent Labs    04/26/18 0030 04/26/18 0447  TROPONINI 0.03* 0.16*   BNP    Component Value Date/Time   BNP 799.0 (H) 04/27/2018 1054    LABS  Glucose Recent Labs  Lab 04/26/18 0245 04/26/18 0735 04/27/18 0739  GLUCAP 149* 126* 113*    BMET Recent Labs  Lab 04/26/18 0030 04/26/18 0447 04/26/18 1835  NA 135 138  --   K 4.0 4.3 4.2  CL 102 104  --   CO2 23 25  --   BUN 21 22  --   CREATININE 1.85* 1.73*  --   GLUCOSE 200* 164*  --     Liver Enzymes Recent Labs  Lab 04/26/18 0030  AST 24  ALT 16  ALKPHOS 79  BILITOT 0.7  ALBUMIN 4.2    Electrolytes  Recent Labs  Lab 04/26/18 0030 04/26/18 0447 04/26/18 1835  CALCIUM 8.8* 8.3*  --   MG  --   --  1.9    CBC Recent Labs  Lab 04/26/18 0030 04/26/18 0447 04/27/18 0948  WBC 13.2* 14.8* 20.9*  HGB 13.0 10.9* 9.4*  HCT 40.5 35.4* 30.0*  PLT 237 191 142*    ABG Recent Labs  Lab 04/26/18 0407 04/27/18 0113 04/27/18 0202  PHART 7.27* 7.14* 7.21*  PCO2ART 54* 71* 63*  PO2ART 146* 81* 78*    Coag's Recent Labs  Lab 04/26/18 0030  APTT 29  INR 0.99    Sepsis Markers Recent Labs  Lab 04/26/18 0030 04/26/18 0154 04/26/18 0447  LATICACIDVEN  --  1.4 1.6  PROCALCITON <0.10  --   --     Cardiac Enzymes Recent Labs  Lab 04/26/18 0030 04/26/18 0447  TROPONINI 0.03* 0.16*    ASSESSMENT AND PLAN   Acute ventilator dependent respiratory failure with hypoxemia Suspected community-acquired pneumonia Suspected pulmonary  edema Hypotension Paroxysmal atrial fibrillation AKI, nonoliguric Severe sepsis, septic shock Acute encephalopathy with intermittent agitation  Cont full vent support - settings reviewed and/or adjusted Cont vent bundle Daily SBT if/when meets criteria Norepinephrine changed to phenylephrine 11/11 Amiodarone infusion initiated 11/11 Monitor BMET intermittently Monitor I/Os Correct electrolytes as indicated Monitor temp, WBC count Micro and abx as above PAD protocol with propofol and intermittent fentanyl she dated 11/11  Son and daughter-in-law (who is a Designer, jewellery) updated at bedside   CCM time: 45 mins  The above time includes time spent in consultation with patient and/or family members and reviewing care plan on multidisciplinary rounds  Merton Border, MD PCCM service Mobile [phone] Pager [phone] 04/27/2018 2:14 PM

## 2018-04-27 NOTE — Progress Notes (Signed)
No KUB since admit and OG tube placement. Pt still having gastric content from OG hooked to LIS. KUB obtained and NP verbal to advance from 53 to 57. OG advanced

## 2018-04-28 ENCOUNTER — Inpatient Hospital Stay: Payer: Medicare Other

## 2018-04-28 DIAGNOSIS — A419 Sepsis, unspecified organism: Principal | ICD-10-CM

## 2018-04-28 LAB — GLUCOSE, CAPILLARY
GLUCOSE-CAPILLARY: 100 mg/dL — AB (ref 70–99)
GLUCOSE-CAPILLARY: 78 mg/dL (ref 70–99)
Glucose-Capillary: 97 mg/dL (ref 70–99)
Glucose-Capillary: 96 mg/dL (ref 70–99)

## 2018-04-28 LAB — COMPREHENSIVE METABOLIC PANEL
ALBUMIN: 3 g/dL — AB (ref 3.5–5.0)
ALK PHOS: 68 U/L (ref 38–126)
ALT: 28 U/L (ref 0–44)
AST: 74 U/L — AB (ref 15–41)
Anion gap: 10 (ref 5–15)
BILIRUBIN TOTAL: 0.6 mg/dL (ref 0.3–1.2)
BUN: 33 mg/dL — AB (ref 8–23)
CALCIUM: 8.2 mg/dL — AB (ref 8.9–10.3)
CO2: 24 mmol/L (ref 22–32)
CREATININE: 2.52 mg/dL — AB (ref 0.61–1.24)
Chloride: 103 mmol/L (ref 98–111)
GFR calc Af Amer: 27 mL/min — ABNORMAL LOW (ref >60)
GFR, EST NON AFRICAN AMERICAN: 23 mL/min — AB (ref >60)
GLUCOSE: 122 mg/dL — AB (ref 70–99)
Potassium: 4 mmol/L (ref 3.5–5.1)
Sodium: 137 mmol/L (ref 135–145)
TOTAL PROTEIN: 5.8 g/dL — AB (ref 6.5–8.1)

## 2018-04-28 LAB — BLOOD GAS, ARTERIAL
ACID-BASE DEFICIT: 5.6 mmol/L — AB (ref 0.0–2.0)
Acid-base deficit: 1.3 mmol/L (ref 0.0–2.0)
BICARBONATE: 26.2 mmol/L (ref 20.0–28.0)
Bicarbonate: 22.9 mmol/L (ref 20.0–28.0)
FIO2: 0.35
FIO2: 0.35
LHR: 20 {breaths}/min
MECHANICAL RATE: 20
MECHVT: 400 mL
O2 Saturation: 94.9 %
O2 Saturation: 93.9 %
PATIENT TEMPERATURE: 37
PATIENT TEMPERATURE: 37
PEEP: 5 cmH2O
PEEP: 5 cmH2O
PH ART: 7.19 — AB (ref 7.350–7.450)
PO2 ART: 92 mmHg (ref 83.0–108.0)
RATE: 16 resp/min
VT: 400 mL
pCO2 arterial: 60 mmHg — ABNORMAL HIGH (ref 32.0–48.0)
pCO2 arterial: 57 mmHg — ABNORMAL HIGH (ref 32.0–48.0)
pH, Arterial: 7.27 — ABNORMAL LOW (ref 7.350–7.450)
pO2, Arterial: 80 mmHg — ABNORMAL LOW (ref 83.0–108.0)

## 2018-04-28 LAB — TROPONIN I: TROPONIN I: 0.44 ng/mL — AB (ref <0.03)

## 2018-04-28 LAB — CBC
HEMATOCRIT: 33.1 % — AB (ref 39.0–52.0)
HEMOGLOBIN: 10.6 g/dL — AB (ref 13.0–17.0)
MCH: 29.8 pg (ref 26.0–34.0)
MCHC: 32 g/dL (ref 30.0–36.0)
MCV: 93 fL (ref 80.0–100.0)
NRBC: 0 % (ref 0.0–0.2)
Platelets: 160 10*3/uL (ref 150–400)
RBC: 3.56 MIL/uL — ABNORMAL LOW (ref 4.22–5.81)
RDW: 14.2 % (ref 11.5–15.5)
WBC: 29.6 10*3/uL — AB (ref 4.0–10.5)

## 2018-04-28 LAB — MAGNESIUM: MAGNESIUM: 1.8 mg/dL (ref 1.7–2.4)

## 2018-04-28 LAB — LEGIONELLA PNEUMOPHILA SEROGP 1 UR AG: L. PNEUMOPHILA SEROGP 1 UR AG: NEGATIVE

## 2018-04-28 LAB — PHOSPHORUS: Phosphorus: 4.6 mg/dL (ref 2.5–4.6)

## 2018-04-28 LAB — TSH: TSH: 0.857 u[IU]/mL (ref 0.350–4.500)

## 2018-04-28 MED ORDER — DEXTROSE IN LACTATED RINGERS 5 % IV SOLN
INTRAVENOUS | Status: DC
  Administered 2018-04-28: 50 mL/h via INTRAVENOUS
  Administered 2018-04-29: 13:00:00 via INTRAVENOUS

## 2018-04-28 MED ORDER — SODIUM CHLORIDE 0.9 % IV SOLN
3.0000 g | Freq: Once | INTRAVENOUS | Status: AC
  Administered 2018-04-28: 3 g via INTRAVENOUS
  Filled 2018-04-28: qty 3

## 2018-04-28 MED ORDER — VANCOMYCIN HCL IN DEXTROSE 1-5 GM/200ML-% IV SOLN
1000.0000 mg | INTRAVENOUS | Status: DC
  Administered 2018-04-29: 1000 mg via INTRAVENOUS
  Filled 2018-04-28 (×2): qty 200

## 2018-04-28 MED ORDER — FUROSEMIDE 10 MG/ML IJ SOLN
80.0000 mg | Freq: Once | INTRAMUSCULAR | Status: AC
  Administered 2018-04-28: 80 mg via INTRAVENOUS
  Filled 2018-04-28: qty 8

## 2018-04-28 MED ORDER — SODIUM BICARBONATE 8.4 % IV SOLN
100.0000 meq | Freq: Once | INTRAVENOUS | Status: AC
  Administered 2018-04-28: 100 meq via INTRAVENOUS
  Filled 2018-04-28: qty 100

## 2018-04-28 MED ORDER — SODIUM CHLORIDE 0.9 % IV SOLN
3.0000 g | Freq: Two times a day (BID) | INTRAVENOUS | Status: DC
  Administered 2018-04-28 – 2018-04-29 (×2): 3 g via INTRAVENOUS
  Filled 2018-04-28 (×3): qty 3

## 2018-04-28 MED ORDER — PIPERACILLIN-TAZOBACTAM 3.375 G IVPB: 3.3750 g | Freq: Three times a day (TID) | INTRAVENOUS | Status: DC

## 2018-04-28 MED ORDER — PANTOPRAZOLE SODIUM 40 MG IV SOLR
40.0000 mg | Freq: Every day | INTRAVENOUS | Status: DC
  Administered 2018-04-28 – 2018-05-05 (×8): 40 mg via INTRAVENOUS
  Filled 2018-04-28 (×8): qty 40

## 2018-04-28 MED ORDER — VANCOMYCIN HCL IN DEXTROSE 1-5 GM/200ML-% IV SOLN
1000.0000 mg | Freq: Once | INTRAVENOUS | Status: AC
  Administered 2018-04-28: 1000 mg via INTRAVENOUS
  Filled 2018-04-28: qty 200

## 2018-04-28 NOTE — Progress Notes (Addendum)
PULMONARY / CRITICAL CARE MEDICINE   NAME:  Matthew Zamora, MRN:  [id number], DOB:  [birth year], LOS: 2 ADMISSION DATE:  04/26/2018   PT PROFILE:    20 M adm via ED with acute AMS/delirium, hypoxemia, tachypnea, labile BP, leukocytosis, suspected pneumonia.  Developed AFRVR on day following admission.  04/28/2018: - Overnight found to be acidotic received bicarb, vent adjusted - Patient is currently on amiodarone drip, phenylephrine drip, propofol -No other major events overnight electrolytes acceptable creatinine went up to 2.52 pH dropped to 7.19 -WBC increased to 29.6 all cultures have been negative so far no fever noted   SIGNIFICANT STUDIES/EVENTS:  11/10 Echcoardiogram: LVEF 40-45%.  Mild MR.  LA moderately dilated.  RVSP estimate 50 mmHg 11/11 Developed AFRVR. NE changed to PE. Amiodarone infusion intitiated.  11/11 Severe intermittent agitation. Propofol initiated  CULTURES:  MRSA PCR 11/10 >> NEG Resp 11/11 >>  Blood 11/10 >>  Urine strep Ag 11/11 >>  Urine Legionella Ag 11/11 >>   ANTIBIOTICS:  Azithromycin 11/10 >>  Ceftriaxone 11/10 >>     LINES/TUBES:  ETT 11/10 >>  RUE PICC 11/10 >>   CONSULTANTS:  Nephrology   SUBJECTIVE:  Intermittent agitation.  When calm, synchronous with mechanical ventilation.  Not F/C.  CONSTITUTIONAL: BP (!) 131/51   Pulse 67   Temp (!) 96.4 F (35.8 C)   Resp 20   Ht 5\' 11"  (1.803 m)   Wt 86.1 kg   SpO2 99%   BMI 26.47 kg/m   I/O last 3 completed shifts: In: 5218.9 [I.V.:5019; IV Piggyback:199.9] Out: 1715 [Urine:1490; Emesis/NG output:225]     Vent Mode: PRVC FiO2 (%):  [35 %] 35 % Set Rate:  [16 bmp-26 bmp] 20 bmp Vt Set:  [400 mL] 400 mL PEEP:  [5 cmH20] 5 cmH20 Plateau Pressure:  [16 cmH20-21 cmH20] 16 cmH20  PHYSICAL EXAM: General: Intubated, sedated, not F/C Neuro: CNs intact, moves all extremities HEENT: NCAT, sclerae white Cardiovascular: IRIR, tachy, no M noted Lungs: Scattered bilateral  rhonchi, no wheezes Abdomen: soft, NT, + BS Ext: warm, no edema Skin: No lesions noted  BMP Latest Ref Rng & Units 04/28/2018 04/27/2018 04/27/2018  Glucose 70 - 99 mg/dL 122(H) 152(H) 297(H)  BUN 8 - 23 mg/dL 33(H) 25(H) 18  Creatinine 0.61 - 1.24 mg/dL 2.52(H) 1.81(H) 1.26(H)  Sodium 135 - 145 mmol/L 137 133(L) 133(L)  Potassium 3.5 - 5.1 mmol/L 4.0 4.0 2.9(L)  Chloride 98 - 111 mmol/L 103 101 104  CO2 22 - 32 mmol/L 24 21(L) 18(L)  Calcium 8.9 - 10.3 mg/dL 8.2(L) 6.8(L) 5.1(LL)   CBC Latest Ref Rng & Units 04/28/2018 04/27/2018 04/26/2018  WBC 4.0 - 10.5 K/uL 29.6(H) 20.9(H) 14.8(H)  Hemoglobin 13.0 - 17.0 g/dL 10.6(L) 9.4(L) 10.9(L)  Hematocrit 39.0 - 52.0 % 33.1(L) 30.0(L) 35.4(L)  Platelets 150 - 400 K/uL 160 142(L) 191    Cardiac Panel (last 3 results) Recent Labs    04/27/18 1125 04/27/18 1732 04/28/18 0313  TROPONINI 0.43* 0.57* 0.44*   BNP    Component Value Date/Time   BNP 799.0 (H) 04/27/2018 1054    LABS  Glucose Recent Labs  Lab 04/26/18 0245 04/26/18 0735 04/27/18 0739  GLUCAP 149* 126* 113*    BMET Recent Labs  Lab 04/27/18 1125 04/27/18 1732 04/28/18 0313  NA 133* 133* 137  K 2.9* 4.0 4.0  CL 104 101 103  CO2 18* 21* 24  BUN 18 25* 33*  CREATININE 1.26* 1.81* 2.52*  GLUCOSE 297* 152*  122*    Liver Enzymes Recent Labs  Lab 04/26/18 0030 04/28/18 0313  AST 24 74*  ALT 16 28  ALKPHOS 79 68  BILITOT 0.7 0.6  ALBUMIN 4.2 3.0*    Electrolytes Recent Labs  Lab 04/26/18 1835 04/27/18 1125 04/27/18 1732 04/28/18 0313  CALCIUM  --  5.1* 6.8* 8.2*  MG 1.9 1.3*  1.2*  --  1.8  PHOS  --  2.4*  --  4.6    CBC Recent Labs  Lab 04/26/18 0447 04/27/18 0948 04/28/18 0313  WBC 14.8* 20.9* 29.6*  HGB 10.9* 9.4* 10.6*  HCT 35.4* 30.0* 33.1*  PLT 191 142* 160    ABG Recent Labs  Lab 04/27/18 0113 04/27/18 0202 04/28/18 0500  PHART 7.14* 7.21* 7.19*  PCO2ART 71* 63* 60*  PO2ART 81* 78* 92    Coag's Recent Labs   Lab 04/26/18 0030  APTT 29  INR 0.99    Sepsis Markers Recent Labs  Lab 04/26/18 0030 04/26/18 0154 04/26/18 0447  LATICACIDVEN  --  1.4 1.6  PROCALCITON <0.10  --   --     Cardiac Enzymes Recent Labs  Lab 04/27/18 1125 04/27/18 1732 04/28/18 0313  TROPONINI 0.43* 0.57* 0.44*    ASSESSMENT AND PLAN   Acute ventilator dependent respiratory failure with hypoxemia Suspected community-acquired pneumonia Suspected pulmonary edema Hypotension Paroxysmal atrial fibrillation AKI, nonoliguric Severe sepsis, septic shock Acute encephalopathy with intermittent agitation  PLAN:  [id number] changed to phenylephrine 11/11 Amiodarone infusion initiated 11/11 for A. fib Give large dose of Lasix If continued to remain hypotensive and difficult to manage fluid consider cardiology consult as possibility of cardiogenic shock on top of everything else JJ:OACZ full vent support - settings reviewed and/or adjusted Cont vent bundle Daily SBT if/when meets criteria -- Aspiration precaution -- Vent protocol on vented patient  ID:WBC going up, Monitor for any diarrhoea, Continue rocephin and zithromax if continued worsening consider repeat cultures and broaden the coverage -- Follow culture and adjust ENDO: Check TSH -- SSI monitor blood sugar GI: Feeding as tolerated continue PPI RENAL: Renal consult is called, see response with large dose of Lasix -- Electrolyte replacement protocol, Monitor Cr and K  CNS: Currently sedated HEMATOLOGY: Monitor Hb -- Monitor HB and PLT MUSCULOSKELETAL: No acute issues PAIN AND SEDATION: Currently on propofol and fentanyl as needed  Skin/Wound: Chronic changes  Electrolytes: Replace electrolytes per ICU electrolyte replacement protocol.   IVF: none  Nutrition: Tube feeds as tolerated  Prophylaxis: DVT Prophylaxis with heparin,. GI Prophylaxis.   Restraints: Soft limb as he is trying to pull the tube on the case regardless  PT/OT  eval and treat. OOB when appropriate.   Lines/Tubes: 04/26/2018 Foley pick 04/26/2018 WOR central line. ADVANCE DIRECTIVE: Full code FAMILY DISCUSSION: No known available to discuss Quality Care: PPI, DVT prophylaxis, HOB elevated, Infection control all reviewed and addressed. Events and notes from last 24 hours reviewed. Care plan discussed on multidisciplinary rounds CC TIME: 40 minutes     Old records reviewed discussed results and management plan with patient  Images personally reviewed and results and labs reviewed and discussed with patient.  All medication reviewed and adjusted  Further management depending on test results and work up as outlined above.  Addendum: WBC up Will rpt culture Change to Vanco and unasyn Head ct done and was negative   Lahoma Rocker, M.D

## 2018-04-28 NOTE — Progress Notes (Signed)
PALLIATIVE NOTE:  Referral received for goals of care. I spoke with son [name] and introduced myself and Palliative Medicine. At this time son has requested to meet tomorrow 11/13 @ 0900 for goals of care discussion with hisself and other family members.   Detailed note and recommendations to follow [place] meeting tomorrow.   Thank you for your referral.   Alda Lea, AGPCNP-BC Palliative Medicine Team  Phone: [phone] Fax: [phone] Pager: [phone] Amion: N. [place]

## 2018-04-28 NOTE — Progress Notes (Signed)
Patient ID: Matthew Zamora, male   DOB: November 16, 1940, 77 y.o.   MRN: [id number]  [facility] PROGRESS NOTE  Matthew Zamora [name] DOB: 01-Jun-1941 DOA: 04/26/2018 PCP: Matthew Pink, MD  HPI/Subjective: Patient intubated and sedated.  Try to open eyes with sternal rub.  Objective: Vitals:   04/28/18 0500 04/28/18 0600  BP: (!) 118/47 (!) 131/51  Pulse: 71 67  Resp: 18 20  Temp: (!) 96.3 F (35.7 C) (!) 96.4 F (35.8 C)  SpO2: 99% 99%    Filed Weights   04/26/18 0300 04/27/18 0500 04/28/18 0500  Weight: 76.2 kg 82.8 kg 86.1 kg    ROS: Review of Systems  Unable to perform ROS: Intubated   Exam: Physical Exam  Constitutional: He is intubated.  HENT:  Nose: No mucosal edema.  Unable to look into mouth  Eyes: Conjunctivae and lids are normal.  Pupils sluggish  Neck: Carotid bruit is not present. No thyromegaly present.  Cardiovascular: S1 normal, S2 normal and normal heart sounds. An irregularly irregular rhythm present. Tachycardia present.  Respiratory: He is intubated. He has decreased breath sounds in the right lower field and the left lower field. He has no wheezes. He has rhonchi in the right lower field and the left lower field. He has no rales.  GI: Soft. Bowel sounds are normal. There is no tenderness.  Musculoskeletal:       Right ankle: He exhibits no swelling.       Left ankle: He exhibits no swelling.  Neurological:  Intubated and sedated  Skin: Skin is warm. Rash noted. Nails show no clubbing.  Psychiatric:  Unable to assess secondary to being intubated and sedated      Data Reviewed: Basic Metabolic Panel: Recent Labs  Lab 04/26/18 0030 04/26/18 0447 04/26/18 1835 04/27/18 1125 04/27/18 1732 04/28/18 0313  NA 135 138  --  133* 133* 137  K 4.0 4.3 4.2 2.9* 4.0 4.0  CL 102 104  --  104 101 103  CO2 23 25  --  18* 21* 24  GLUCOSE 200* 164*  --  297* 152* 122*  BUN 21 22  --  18 25* 33*  CREATININE 1.85* 1.73*  --  1.26*  1.81* 2.52*  CALCIUM 8.8* 8.3*  --  5.1* 6.8* 8.2*  MG  --   --  1.9 1.3*  1.2*  --  1.8  PHOS  --   --   --  2.4*  --  4.6   Liver Function Tests: Recent Labs  Lab 04/26/18 0030 04/28/18 0313  AST 24 74*  ALT 16 28  ALKPHOS 79 68  BILITOT 0.7 0.6  PROT 7.5 5.8*  ALBUMIN 4.2 3.0*   CBC: Recent Labs  Lab 04/26/18 0030 04/26/18 0447 04/27/18 0948 04/28/18 0313  WBC 13.2* 14.8* 20.9* 29.6*  NEUTROABS 7.7  --   --   --   HGB 13.0 10.9* 9.4* 10.6*  HCT 40.5 35.4* 30.0* 33.1*  MCV 91.4 93.2 94.3 93.0  PLT 237 191 142* 160   Cardiac Enzymes: Recent Labs  Lab 04/26/18 0030 04/26/18 0447 04/27/18 1125 04/27/18 1732 04/28/18 0313  TROPONINI 0.03* 0.16* 0.43* 0.57* 0.44*   BNP (last 3 results) Recent Labs    04/26/18 0030 04/27/18 1054  BNP 717.0* 799.0*    CBG: Recent Labs  Lab 04/26/18 0245 04/26/18 0735 04/27/18 0739  GLUCAP 149* 126* 113*    Recent Results (from the past 240 hour(s))  Blood culture (routine x 2)  Status: None (Preliminary result)   Collection Time: 04/26/18  1:54 AM  Result Value Ref Range Status   Specimen Description BLOOD LEFT FATTY CASTS  Final   Special Requests   Final    BOTTLES DRAWN AEROBIC AND ANAEROBIC Blood Culture adequate volume   Culture   Final    NO GROWTH 2 DAYS Performed at [facility], [address] [place], [place] [address]    Report Status PENDING  Incomplete  Blood culture (routine x 2)     Status: None (Preliminary result)   Collection Time: 04/26/18  1:55 AM  Result Value Ref Range Status   Specimen Description BLOOD LEFT ASSIST CONTROL  Final   Special Requests   Final    BOTTLES DRAWN AEROBIC AND ANAEROBIC Blood Culture results may not be optimal due to an excessive volume of blood received in culture bottles   Culture   Final    NO GROWTH 2 DAYS Performed at [facility], [address] [place], [place] [address]    Report Status PENDING  Incomplete  MRSA PCR Screening      Status: None   Collection Time: 04/26/18  2:54 AM  Result Value Ref Range Status   MRSA by PCR NEGATIVE NEGATIVE Final    Comment:        The GeneXpert MRSA Assay (FDA approved for NASAL specimens only), is one component of a comprehensive MRSA colonization surveillance program. It is not intended to diagnose MRSA infection nor to guide or monitor treatment for MRSA infections. Performed at [facility], [address] [place], [place] [address]   Culture, respiratory (non-expectorated)     Status: None (Preliminary result)   Collection Time: 04/27/18 10:06 AM  Result Value Ref Range Status   Specimen Description   Final    TRACHEAL ASPIRATE Performed at [facility], [address], [place], [place] [address]    Special Requests   Final    NONE Performed at [facility], [address] [place], [place] [address]    Gram Stain   Final    FEW WBC PRESENT, PREDOMINANTLY PMN RARE GRAM POSITIVE RODS Performed at [facility], [place] [address], [place],  [address]    Culture PENDING  Incomplete   Report Status PENDING  Incomplete     Studies: Dg Abd 1 View  Result Date: 04/27/2018 CLINICAL DATA:  Abdominal distension EXAM: ABDOMEN - 1 VIEW COMPARISON:  CT abdomen and pelvis May 16, 2015 FINDINGS: Nasogastric tube tip and side port are in the stomach. There is moderate stool in the colon. There is no appreciable bowel dilatation or air-fluid level to suggest bowel obstruction. No free air. There are multiple surgical clips in the pelvis. Visualized lung bases are clear. IMPRESSION: Nasogastric tube tip and side port in stomach. No bowel obstruction or free air evident. Moderate stool in colon. Multiple surgical clips in pelvis. Electronically Signed   By: Lowella Grip III M.D.   On: 04/27/2018 20:11   Dg Chest Port 1 View  Result Date: 04/28/2018 CLINICAL DATA:  Respiratory failure EXAM: PORTABLE CHEST 1 VIEW COMPARISON:   04/27/2018 FINDINGS: Endotracheal tube remains in good position. NG tube enters the stomach. AICD in good position. Right arm PICC tip in the SVC unchanged. Bibasilar airspace disease and bilateral effusions unchanged. Pulmonary vascular congestion unchanged. IMPRESSION: Bibasilar airspace disease and bilateral effusions unchanged. Support lines remain in good position. Electronically Signed   By: Franchot Gallo M.D.  On: 04/28/2018 07:01   Portable Chest Xray  Result Date: 04/27/2018 CLINICAL DATA:  Acute respiratory failure EXAM: PORTABLE CHEST 1 VIEW COMPARISON:  04/26/2018 FINDINGS: Endotracheal tube and nasogastric catheter are again seen in satisfactory position. Defibrillator is again noted and stable. Right-sided PICC line has been placed in the interval in satisfactory position. Increasing density is noted over the bases bilaterally consistent with small posteriorly layering effusions. These are slightly accentuated due to the patient's positioning. Stable parenchymal opacities are noted in the left base. No pneumothorax is noted. IMPRESSION: Stable parenchymal infiltrate in the left base. Small effusions are noted. Right-sided PICC line in satisfactory position. Electronically Signed   By: Inez Catalina M.D.   On: 04/27/2018 07:07    Scheduled Meds: . aspirin  81 mg Per Tube Daily  . atorvastatin  40 mg Per Tube Daily  . chlorhexidine gluconate (MEDLINE KIT)  15 mL Mouth Rinse BID  . clopidogrel  75 mg Per Tube Daily  . feeding supplement (PRO-STAT SUGAR FREE 64)  30 mL Per Tube TID  . furosemide  80 mg Intravenous Once  . heparin  5,000 Units Subcutaneous Q8H  . lipase/protease/amylase  12,000 Units Oral Q8H  . mouth rinse  15 mL Mouth Rinse 10 times per day  . pantoprazole sodium  40 mg Per Tube Daily  . sodium chloride flush  10-40 mL Intracatheter Q12H  . sterile water (preservative free)       Continuous Infusions: . amiodarone 30 mg/hr (04/28/18 0600)  . azithromycin    .  cefTRIAXone (ROCEPHIN)  IV Stopped (04/27/18 2221)  . feeding supplement (VITAL 1.5 CAL) Stopped (04/27/18 1233)  . fentaNYL infusion INTRAVENOUS Stopped (04/27/18 1316)  . phenylephrine (NEO-SYNEPHRINE) Adult infusion 160 mcg/min (04/28/18 0600)  . propofol (DIPRIVAN) infusion 35 mcg/kg/min (04/28/18 9030)    Assessment/Plan:  1. Septic shock secondary to pneumonia.  Patient on Neo-Synephrine to maintain blood pressure.  Patient on Rocephin and Zithromax. 2. Acute hypoxic respiratory failure with respiratory acidosis.  Currently on the vent 35%.  Patient currently on sedation with propofol 3. Atrial fibrillation with rapid ventricular response placed on amiodarone drip.  Echocardiogram showed an EF of 40 to 45%.  Rate controlled today on amiodarone drip. 4. Elevated troponin secondary to demand ischemia with septic shock.  5. Acute kidney injury on chronic kidney disease stage III.  Monitor closely. 6. Pancreatic insufficiency on pancreatic enzymes 7. Anemia of chronic disease 8. Hyperlipidemia unspecified on Lipitor 9. History of prostate cancer 10. 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  Code Status:     Code Status Orders  (From admission, onward)         Start     Ordered   04/26/18 0254  Full code  Continuous     04/26/18 0253        Code Status History    This patient has a current code status but no historical code status.     Family Communication: Spoke with family yesterday Disposition Plan: To be determined  Consultants:  Critical care specialist  Antibiotics: -Rocephin and Zithromax  Time spent: 27 minutes.  Case discussed with critical care specialist and nursing staff.  [facility]           Patient ID: Matthew Zamora, male   DOB: 08-20-1940, 77 y.o.   MRN: [id number]

## 2018-04-28 NOTE — Progress Notes (Signed)
Pharmacy Antibiotic Note  Matthew Zamora is a 77 y.o. male admitted on 04/26/2018 with respiratory distress. Patient was admitted via EMS from home. Patient was intubated and chest x-ray is positive for pneumonia. WBC increased to 29.6 on 11/12. Cultures have been negative so far, no fever noted.  Pharmacy has been consulted for Vancomycin and Unasyn dosing.  Plan: Will start Unasyn 3 g IV Q12h and Vancomycin 1000 mg IV Q24h. Due to AKI, will not stack vancomycin dose, then continue 1000 mg IV q 24 hours with next dose.  Goal trough is 15-20. Will obtain trough as clinically indicated.   Vancomycin kinetics  Using actual body weight 86.1 kg, estimated creatinine clearance 27 ml/min Vd: 60.27 L Ke: 0.026 T1/2: 26.65 hours   Height: 5\' 11"  (180.3 cm) Weight: 189 lb 13.1 oz (86.1 kg) IBW/kg (Calculated) : 75.3  Temp (24hrs), Avg:98.2 F (36.8 C), Min:96.3 F (35.7 C), Max:101.3 F (38.5 C)  Recent Labs  Lab 04/26/18 0030 04/26/18 0154 04/26/18 0447 04/27/18 0948 04/27/18 1125 04/27/18 1732 04/28/18 0313  WBC 13.2*  --  14.8* 20.9*  --   --  29.6*  CREATININE 1.85*  --  1.73*  --  1.26* 1.81* 2.52*  LATICACIDVEN  --  1.4 1.6  --   --   --   --     Estimated Creatinine Clearance: 26.6 mL/min (A) (by C-G formula based on SCr of 2.52 mg/dL (H)).    No Known Allergies  Antimicrobials this admission: Unasyn 11/12 >> Vancomycin 11/12 >>  Azithromycin 11/10 >> 11/12 Ceftriaxone 11/10 >> 11/12  Dose adjustments this admission: N/A  Microbiology results: 11/10 BCx: NG x2 days 11/11 Sputum: reincubated for better growth (11/12) 11/10 MRSA PCR: negative 11/12 BCx: pending  Thank you for allowing pharmacy to be a part of this patient's care.  Suzzanne Cloud 04/28/2018 12:19 PM

## 2018-04-28 NOTE — Consult Note (Signed)
[facility] CONSULT NOTE    Date: 04/28/2018                  Patient Name:  Matthew Zamora  MRN: [id number]  DOB: November 17, 1940  Age / Sex: 77 y.o., male         PCP: Maryland Pink, MD                 Service Requesting Consult: Critical Care                 Reason for Consult: Acute renal failure/CKD stage III            History of Present Illness: Patient is a 77 y.o. male with a PMHx of abdominal aortic aneurysm, coronary artery disease, congestive heart failure, exocrine pancreatic insufficiency, chronic kidney disease stage III baseline creatinine 1.6, prostate cancer, hypertension, who was admitted to [facility] on 04/26/2018 for evaluation of respiratory distress.  Patient cannot offer any history at this point time.  He is currently on the ventilator.  His son is at the bedside.  He called 911 because he was very short of breath.  His blood pressure upon EMS evaluation was 190/100 with oxygen saturation in the 80s.  He was intubated in the emergency department.  After receiving propofol his blood pressure dropped to 60/40.  We are asked to see him now for acute renal failure.  His baseline creatinine appears to be 1.6.  Creatinine currently 2.5.  He also appears to have respiratory acidosis with pH of 7.27 and PCO2 of 57.  Output was 990 cc over the preceding 24 hours.   Medications: Outpatient medications: Medications Prior to Admission  Medication Sig Dispense Refill Last Dose  . aspirin EC 81 MG tablet Take 81 mg by mouth daily.   Unknown at Unknown  . atorvastatin (LIPITOR) 40 MG tablet Take 40 mg by mouth daily.   Unknown at Unknown  . clopidogrel (PLAVIX) 75 MG tablet Take 75 mg by mouth daily.  5 Unknown at Unknown  . furosemide (LASIX) 40 MG tablet Take 20 mg by mouth daily.   Unknown at Unknown  . lisinopril (PRINIVIL,ZESTRIL) 5 MG tablet Take 5 mg by mouth daily.   Unknown at Unknown  . magnesium (MAGTAB) 84 MG (7MEQ) TBCR SR tablet Take 84 mg by  mouth daily.   Unknown at Unknown  . Melatonin 1 MG TABS Take 1 tablet by mouth at bedtime as needed.   prn at prn  . metoprolol succinate (TOPROL-XL) 100 MG 24 hr tablet Take 100 mg by mouth daily. Take with or immediately following a meal.   Unknown at Unknown  . Multiple Vitamins-Minerals (MULTIVITAMIN WITH MINERALS) tablet Take 1 tablet by mouth daily.   Unknown at Unknown  . omeprazole (PRILOSEC) 20 MG capsule Take 20 mg by mouth daily.   Unknown at Unknown  . oxybutynin (DITROPAN XL) 15 MG 24 hr tablet Take 15 mg by mouth at bedtime.   Unknown at Unknown  . lipase/protease/amylase (CREON) 12000 units CPEP capsule Take 24,000 Units by mouth 2 (two) times daily at 10 am and 4 pm.   Not Taking at Unknown time    Current medications: Current Facility-Administered Medications  Medication Dose Route Frequency Provider Last Rate Last Dose  . acetaminophen (TYLENOL) tablet 650 mg  650 mg Per Tube Q6H PRN Wilhelmina Mcardle, MD   650 mg at 04/27/18 1126   Or  . acetaminophen (TYLENOL) suppository 650 mg  650 mg Rectal Q6H PRN Wilhelmina Mcardle, MD      . amiodarone (NEXTERONE PREMIX) 360-4.14 MG/200ML-% (1.8 mg/mL) IV infusion  30 mg/hr Intravenous Continuous Wilhelmina Mcardle, MD 16.67 mL/hr at 04/28/18 0600 30 mg/hr at 04/28/18 0600  . aspirin chewable tablet 81 mg  81 mg Per Tube Daily Charlett Nose, RPH   81 mg at 04/27/18 6834  . atorvastatin (LIPITOR) tablet 40 mg  40 mg Per Tube Daily Charlett Nose, RPH      . azithromycin (ZITHROMAX) 500 mg in sodium chloride 0.9 % 250 mL IVPB  500 mg Intravenous Q24H Wieting, Richard, MD      . bisacodyl (DULCOLAX) EC tablet 5 mg  5 mg Oral Daily PRN Amelia Jo, MD      . bisacodyl (DULCOLAX) suppository 10 mg  10 mg Rectal Daily PRN Tukov-Yual, Magdalene S, NP      . cefTRIAXone (ROCEPHIN) 2 g in sodium chloride 0.9 % 100 mL IVPB  2 g Intravenous Q24H Tukov-Yual, Arlyss Gandy, NP   Stopped at 04/27/18 2221  . chlorhexidine gluconate (MEDLINE KIT)  (PERIDEX) 0.12 % solution 15 mL  15 mL Mouth Rinse BID Tukov-Yual, Magdalene S, NP   15 mL at 04/28/18 0815  . clopidogrel (PLAVIX) tablet 75 mg  75 mg Per Tube Daily Charlett Nose, RPH   75 mg at 04/27/18 1962  . feeding supplement (PRO-STAT SUGAR FREE 64) liquid 30 mL  30 mL Per Tube TID Wilhelmina Mcardle, MD      . feeding supplement (VITAL 1.5 CAL) liquid 1,000 mL  1,000 mL Per Tube Continuous Wilhelmina Mcardle, MD   Stopped at 04/27/18 1233  . fentaNYL (SUBLIMAZE) bolus via infusion 25 mcg  25 mcg Intravenous Q1H PRN Tukov-Yual, Magdalene S, NP   25 mcg at 04/27/18 0959  . fentaNYL (SUBLIMAZE) injection 100 mcg  100 mcg Intravenous Q15 min PRN Wilhelmina Mcardle, MD      . fentaNYL (SUBLIMAZE) injection 100 mcg  100 mcg Intravenous Q2H PRN Wilhelmina Mcardle, MD   50 mcg at 04/27/18 2258  . fentaNYL 2590mg in NS 2571m(1079mml) infusion-PREMIX  25-400 mcg/hr Intravenous Continuous Tukov-Yual, Magdalene S, NP   Stopped at 04/27/18 1316  . heparin injection 5,000 Units  5,000 Units Subcutaneous Q8H MaiAmelia JoD   5,000 Units at 04/28/18 0543  . ipratropium-albuterol (DUONEB) 0.5-2.5 (3) MG/3ML nebulizer solution 3 mL  3 mL Nebulization Q4H PRN SimWilhelmina McardleD      . lipase/protease/amylase (CREON) capsule 12,000 Units  12,000 Units Oral Q8H SimCharlett NosePH      . MEDLINE mouth rinse  15 mL Mouth Rinse 10 times per day Tukov-Yual, Magdalene S, NP   15 mL at 04/28/18 0544  . metoprolol tartrate (LOPRESSOR) injection 2.5-5 mg  2.5-5 mg Intravenous Q3H PRN SimWilhelmina McardleD      . ondansetron (ZOSouthwest Health Center Incablet 4 mg  4 mg Oral Q6H PRN MaiAmelia JoD       Or  . ondansetron (ZOValley Ambulatory Surgery Centernjection 4 mg  4 mg Intravenous Q6H PRN MaiAmelia JoD      . pantoprazole sodium (PROTONIX) 40 mg/20 mL oral suspension 40 mg  40 mg Per Tube Daily SimCharlett NosePH   40 mg at 04/27/18 0950  . phenylephrine (NEO-SYNEPHRINE) 40 mg in sodium chloride 0.9 % 250 mL (0.16 mg/mL) infusion  0-400  mcg/min Intravenous Titrated BlaAwilda BillP 52.5 mL/hr at  04/28/18 0818 140 mcg/min at 04/28/18 0818  . propofol (DIPRIVAN) 1000 MG/100ML infusion  0-50 mcg/kg/min Intravenous Continuous Wilhelmina Mcardle, MD 17.39 mL/hr at 04/28/18 0628 35 mcg/kg/min at 04/28/18 0628  . sennosides (SENOKOT) 8.8 MG/5ML syrup 5 mL  5 mL Per Tube BID PRN Tukov-Yual, Magdalene S, NP      . sodium chloride flush (NS) 0.9 % injection 10-40 mL  10-40 mL Intracatheter Q12H Conforti, John, DO   10 mL at 04/27/18 2159  . sodium chloride flush (NS) 0.9 % injection 10-40 mL  10-40 mL Intracatheter PRN Conforti, John, DO      . sterile water (preservative free) injection               Allergies: No Known Allergies    Past Medical History: Past Medical History:  Diagnosis Date  . Abdominal aortic aneurysm (AAA) ([place]) 05/13/15   seen on ct scan  . 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 Anemia   . 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 CAD (coronary artery disease)   . Cataract cortical, senile   . CHF (congestive heart failure) ([place])   . Chronic hoarseness   . Exocrine pancreatic insufficiency   . H. pylori infection   . History of hepatitis   . Hyperlipidemia   . Hypertension   . 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 Prostate CA [facility])      Past Surgical History: Past Surgical History:  Procedure Laterality Date  . CATARACT EXTRACTION    . COLONOSCOPY  10/06/2014, 09/18/2004, 03/14/2009  . ESOPHAGOGASTRODUODENOSCOPY  10/06/2014, 03/18/2001, 03/14/2009  . FLEXIBLE SIGMOIDOSCOPY  08/26/1990  . INSERTION OF ICD    . PROSTATE SURGERY    . TONSILLECTOMY       Family History: Family History  Problem Relation Age of Onset  . Heart attack Mother   . Heart attack Father      Social History: Social History   Socioeconomic History  . Marital status: Single    Spouse name: Not on file  . Number of children: Not on file  . Years of education: Not on file  . Highest education level: Not on  file  Occupational History  . Not on file  Social Needs  . Financial resource strain: Not on file  . Food insecurity:    Worry: Not on file    Inability: Not on file  . Transportation needs:    Medical: Not on file    Non-medical: Not on file  Tobacco Use  . Smoking status: Former Smoker    Packs/day: 1.00    Years: 38.00    Pack years: 38.00    Types: Cigarettes    Last attempt to quit: 06/18/1999    Years since quitting: 18.8  . Smokeless tobacco: Never Used  Substance and Sexual Activity  . Alcohol use: No    Alcohol/week: 0.0 standard drinks  . Drug use: Not on file  . Sexual activity: Not on file  Lifestyle  . Physical activity:    Days per week: Not on file    Minutes per session: Not on file  . Stress: Not on file  Relationships  . Social connections:    Talks on phone: Not on file    Gets together: Not on file    Attends religious service: Not on file    Active member of club or organization: Not on file    Attends meetings of clubs or organizations: Not on file    Relationship status:  Not on file  . Intimate partner violence:    Fear of current or ex partner: Not on file    Emotionally abused: Not on file    Physically abused: Not on file    Forced sexual activity: Not on file  Other Topics Concern  . Not on file  Social History Narrative  . Not on file     Review of Systems: Cannot provide as he's intubated and sedated.   Vital Signs: Blood pressure (!) 115/51, pulse 78, temperature (!) 96.8 F (36 C), resp. rate 15, height '5\' 11"'  (1.803 m), weight 86.1 kg, SpO2 99 %.  Weight trends: Filed Weights   04/26/18 0300 04/27/18 0500 04/28/18 0500  Weight: 76.2 kg 82.8 kg 86.1 kg    Physical Exam: General: Critically appearing  Head: Normocephalic, atraumatic.  Eyes: Anicteric  Nose: Mucous membranes moist, not inflammed, nonerythematous.  Throat: ETT in place  Neck: Supple, trachea midline.  Lungs:  Bilateral rhonchi, vent assisted  Heart: S1S2  no rubs  Abdomen:  BS normoactive. Soft, Nondistended, non-tender.  No masses or organomegaly.  Extremities: No pretibial edema.  Neurologic: Inbutated/sedated  Skin: No visible rashes, scars.    Lab results: Basic Metabolic Panel: Recent Labs  Lab 04/26/18 1835 04/27/18 1125 04/27/18 1732 04/28/18 0313  NA  --  133* 133* 137  K 4.2 2.9* 4.0 4.0  CL  --  104 101 103  CO2  --  18* 21* 24  GLUCOSE  --  297* 152* 122*  BUN  --  18 25* 33*  CREATININE  --  1.26* 1.81* 2.52*  CALCIUM  --  5.1* 6.8* 8.2*  MG 1.9 1.3*  1.2*  --  1.8  PHOS  --  2.4*  --  4.6    Liver Function Tests: Recent Labs  Lab 04/26/18 0030 04/28/18 0313  AST 24 74*  ALT 16 28  ALKPHOS 79 68  BILITOT 0.7 0.6  PROT 7.5 5.8*  ALBUMIN 4.2 3.0*   No results for input(s): LIPASE, AMYLASE in the last 168 hours. No results for input(s): AMMONIA in the last 168 hours.  CBC: Recent Labs  Lab 04/26/18 0030 04/26/18 0447 04/27/18 0948 04/28/18 0313  WBC 13.2* 14.8* 20.9* 29.6*  NEUTROABS 7.7  --   --   --   HGB 13.0 10.9* 9.4* 10.6*  HCT 40.5 35.4* 30.0* 33.1*  MCV 91.4 93.2 94.3 93.0  PLT 237 191 142* 160    Cardiac Enzymes: Recent Labs  Lab 04/26/18 0030 04/26/18 0447 04/27/18 1125 04/27/18 1732 04/28/18 0313  TROPONINI 0.03* 0.16* 0.43* 0.57* 0.44*    BNP: Invalid input(s): POCBNP  CBG: Recent Labs  Lab 04/26/18 0245 04/26/18 0735 04/27/18 0739 04/28/18 0805  GLUCAP 149* 126* 113* 30    Microbiology: Results for orders placed or performed during the hospital encounter of 04/26/18  Blood culture (routine x 2)     Status: None (Preliminary result)   Collection Time: 04/26/18  1:54 AM  Result Value Ref Range Status   Specimen Description BLOOD LEFT FATTY CASTS  Final   Special Requests   Final    BOTTLES DRAWN AEROBIC AND ANAEROBIC Blood Culture adequate volume   Culture   Final    NO GROWTH 2 DAYS Performed at [facility], [address] [place], [place]  [address]    Report Status PENDING  Incomplete  Blood culture (routine x 2)     Status: None (Preliminary result)   Collection Time: 04/26/18  1:55 AM  Result Value Ref Range Status   Specimen Description BLOOD LEFT ASSIST CONTROL  Final   Special Requests   Final    BOTTLES DRAWN AEROBIC AND ANAEROBIC Blood Culture results may not be optimal due to an excessive volume of blood received in culture bottles   Culture   Final    NO GROWTH 2 DAYS Performed at [facility], [address] [place], [place] [address]    Report Status PENDING  Incomplete  MRSA PCR Screening     Status: None   Collection Time: 04/26/18  2:54 AM  Result Value Ref Range Status   MRSA by PCR NEGATIVE NEGATIVE Final    Comment:        The GeneXpert MRSA Assay (FDA approved for NASAL specimens only), is one component of a comprehensive MRSA colonization surveillance program. It is not intended to diagnose MRSA infection nor to guide or monitor treatment for MRSA infections. Performed at [facility], [address] [place], [place] [address]   Culture, respiratory (non-expectorated)     Status: None (Preliminary result)   Collection Time: 04/27/18 10:06 AM  Result Value Ref Range Status   Specimen Description   Final    TRACHEAL ASPIRATE Performed at [facility], [address], [place], [place] [address]    Special Requests   Final    NONE Performed at [facility], [address] [place], [place] [address]    Gram Stain   Final    FEW WBC PRESENT, PREDOMINANTLY PMN RARE GRAM POSITIVE RODS    Culture   Final    CULTURE REINCUBATED FOR BETTER GROWTH Performed at [facility], [place] [address], [place], [place] [address]    Report Status PENDING  Incomplete    Coagulation Studies: Recent Labs    04/26/18 0030  LABPROT 13.0  INR 0.99    Urinalysis: Recent Labs    04/26/18 0030  COLORURINE YELLOW*  LABSPEC 1.008  PHURINE 6.0  GLUCOSEU  NEGATIVE  HGBUR NEGATIVE  BILIRUBINUR NEGATIVE  KETONESUR NEGATIVE  PROTEINUR NEGATIVE  NITRITE NEGATIVE  LEUKOCYTESUR NEGATIVE      Imaging: Dg Abd 1 View  Result Date: 04/27/2018 CLINICAL DATA:  Abdominal distension EXAM: ABDOMEN - 1 VIEW COMPARISON:  CT abdomen and pelvis May 16, 2015 FINDINGS: Nasogastric tube tip and side port are in the stomach. There is moderate stool in the colon. There is no appreciable bowel dilatation or air-fluid level to suggest bowel obstruction. No free air. There are multiple surgical clips in the pelvis. Visualized lung bases are clear. IMPRESSION: Nasogastric tube tip and side port in stomach. No bowel obstruction or free air evident. Moderate stool in colon. Multiple surgical clips in pelvis. Electronically Signed   By: Lowella Grip III M.D.   On: 04/27/2018 20:11   Dg Chest Port 1 View  Result Date: 04/28/2018 CLINICAL DATA:  Respiratory failure EXAM: PORTABLE CHEST 1 VIEW COMPARISON:  04/27/2018 FINDINGS: Endotracheal tube remains in good position. NG tube enters the stomach. AICD in good position. Right arm PICC tip in the SVC unchanged. Bibasilar airspace disease and bilateral effusions unchanged. Pulmonary vascular congestion unchanged. IMPRESSION: Bibasilar airspace disease and bilateral effusions unchanged. Support lines remain in good position. Electronically Signed   By: Franchot Gallo M.D.   On: 04/28/2018 07:01   Portable Chest Xray  Result Date: 04/27/2018 CLINICAL DATA:  Acute respiratory failure EXAM: PORTABLE CHEST 1 VIEW COMPARISON:  04/26/2018 FINDINGS: Endotracheal tube and  nasogastric catheter are again seen in satisfactory position. Defibrillator is again noted and stable. Right-sided PICC line has been placed in the interval in satisfactory position. Increasing density is noted over the bases bilaterally consistent with small posteriorly layering effusions. These are slightly accentuated due to the patient's positioning.  Stable parenchymal opacities are noted in the left base. No pneumothorax is noted. IMPRESSION: Stable parenchymal infiltrate in the left base. Small effusions are noted. Right-sided PICC line in satisfactory position. Electronically Signed   By: Inez Catalina M.D.   On: 04/27/2018 07:07      Assessment & Plan: Pt is a 77 y.o. male with a PMHx of abdominal aortic aneurysm, coronary artery disease, congestive heart failure, exocrine pancreatic insufficiency, chronic kidney disease stage III baseline creatinine 1.6, prostate cancer, hypertension, who was admitted to [facility] on 04/26/2018 for evaluation of respiratory distress.  1.  Acute renal failure/chronic kidney disease stage III baseline creatinine 1.6.  It appears chronic kidney disease now is multifactorial with contributions from concurrent illness and hypotension.  Patient still on phenylephrine.  Maintain map of 65 or greater.  Check renal ultrasound to make sure there is no underlying obstruction.  Also check SPEP, UPEP, ANA, ANCA antibodies, GBM antibodies, C3, and C4.  No acute indication for dialysis at the moment.  Avoid nephrotoxins as possible.  2.  Hypotension.  Continue phenylephrine to maintain a map of 65 or greater as above.  3.  Acute respiratory failure.  pH was 7.27 with a PCO2 57.  Adjustments of the ventilator per critical care.  4.  Thanks for consultation.

## 2018-04-28 NOTE — Progress Notes (Signed)
MEDICATION RELATED CONSULT NOTE - INITIAL   Pharmacy Consult for amiodarone DDIs  No Known Allergies  Patient Measurements: Height: 5\' 11"  (180.3 cm) Weight: 189 lb 13.1 oz (86.1 kg) IBW/kg (Calculated) : 75.3 Adjusted Body Weight: 78.3  Vital Signs: Temp: 98.8 F (37.1 C) (11/12 1300) Temp Source: Bladder (11/12 1200) BP: 119/54 (11/12 1300) Pulse Rate: 68 (11/12 1300) Intake/Output from previous day: 11/11 0701 - 11/12 0700 In: 2964.6 [I.V.:2864.8; IV Piggyback:99.9] Out: 1215 [Urine:990; Emesis/NG output:225] Intake/Output from this shift: Total I/O In: 575.7 [I.V.:422.4; IV Piggyback:153.3] Out: 600 [Urine:450; Emesis/NG output:150]  Labs: Recent Labs    04/26/18 0030 04/26/18 0447 04/26/18 1835 04/27/18 0948 04/27/18 1125 04/27/18 1732 04/28/18 0313  WBC 13.2* 14.8*  --  20.9*  --   --  29.6*  HGB 13.0 10.9*  --  9.4*  --   --  10.6*  HCT 40.5 35.4*  --  30.0*  --   --  33.1*  PLT 237 191  --  142*  --   --  160  APTT 29  --   --   --   --   --   --   CREATININE 1.85* 1.73*  --   --  1.26* 1.81* 2.52*  MG  --   --  1.9  --  1.3*  1.2*  --  1.8  PHOS  --   --   --   --  2.4*  --  4.6  ALBUMIN 4.2  --   --   --   --   --  3.0*  PROT 7.5  --   --   --   --   --  5.8*  AST 24  --   --   --   --   --  74*  ALT 16  --   --   --   --   --  28  ALKPHOS 79  --   --   --   --   --  68  BILITOT 0.7  --   --   --   --   --  0.6   Estimated Creatinine Clearance: 26.6 mL/min (A) (by C-G formula based on SCr of 2.52 mg/dL (H)).   Microbiology: Recent Results (from the past 720 hour(s))  Blood culture (routine x 2)     Status: None (Preliminary result)   Collection Time: 04/26/18  1:54 AM  Result Value Ref Range Status   Specimen Description BLOOD LEFT FATTY CASTS  Final   Special Requests   Final    BOTTLES DRAWN AEROBIC AND ANAEROBIC Blood Culture adequate volume   Culture   Final    NO GROWTH 2 DAYS Performed at [facility], [address],  [place], [place] [address]    Report Status PENDING  Incomplete  Blood culture (routine x 2)     Status: None (Preliminary result)   Collection Time: 04/26/18  1:55 AM  Result Value Ref Range Status   Specimen Description BLOOD LEFT ASSIST CONTROL  Final   Special Requests   Final    BOTTLES DRAWN AEROBIC AND ANAEROBIC Blood Culture results may not be optimal due to an excessive volume of blood received in culture bottles   Culture   Final    NO GROWTH 2 DAYS Performed at [facility], [address] [place], [place] [address]    Report Status PENDING  Incomplete  MRSA PCR Screening     Status: None  Collection Time: 04/26/18  2:54 AM  Result Value Ref Range Status   MRSA by PCR NEGATIVE NEGATIVE Final    Comment:        The GeneXpert MRSA Assay (FDA approved for NASAL specimens only), is one component of a comprehensive MRSA colonization surveillance program. It is not intended to diagnose MRSA infection nor to guide or monitor treatment for MRSA infections. Performed at [facility], [address] [place], [place] [address]   Culture, respiratory (non-expectorated)     Status: None (Preliminary result)   Collection Time: 04/27/18 10:06 AM  Result Value Ref Range Status   Specimen Description   Final    TRACHEAL ASPIRATE Performed at [facility], [address] [place], [place] [address]    Special Requests   Final    NONE Performed at [facility], [address] [place], [place] [address]    Gram Stain   Final    FEW WBC PRESENT, PREDOMINANTLY PMN RARE GRAM POSITIVE RODS    Culture   Final    CULTURE REINCUBATED FOR BETTER GROWTH Performed at [facility], [place] [address], [place], [place] [address]    Report Status PENDING  Incomplete    Medical History: Past Medical History:  Diagnosis Date  . Abdominal aortic aneurysm (AAA) ([place]) 05/13/15   seen on ct scan  . Adenomatous colon polyp 03/18/2001, 03/14/2009,  10/06/2014  . Anemia   . 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 CAD (coronary artery disease)   . Cataract cortical, senile   . CHF (congestive heart failure) ([place])   . Chronic hoarseness   . Exocrine pancreatic insufficiency   . H. pylori infection   . History of hepatitis   . Hyperlipidemia   . Hypertension   . 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 Prostate CA ([place])       Assessment/Plan   QT interval is 440 on ECG 11/10.  Zofran and azithromycin were stopped on 11/12. Unasyn and vancomycin initiated on 11/12.  No interaction identified for new orders.     [place] Student 04/28/2018,1:52 PM

## 2018-04-29 ENCOUNTER — Inpatient Hospital Stay: Payer: Medicare Other

## 2018-04-29 DIAGNOSIS — I248 Other forms of acute ischemic heart disease: Secondary | ICD-10-CM

## 2018-04-29 DIAGNOSIS — I480 Paroxysmal atrial fibrillation: Secondary | ICD-10-CM

## 2018-04-29 DIAGNOSIS — I2510 Atherosclerotic heart disease of native coronary artery without angina pectoris: Secondary | ICD-10-CM

## 2018-04-29 DIAGNOSIS — I5022 Chronic systolic (congestive) heart failure: Secondary | ICD-10-CM

## 2018-04-29 LAB — CBC WITH DIFFERENTIAL/PLATELET
Abs Immature Granulocytes: 0.21 10*3/uL — ABNORMAL HIGH (ref 0.00–0.07)
BASOS ABS: 0.1 10*3/uL (ref 0.0–0.1)
BASOS PCT: 0 %
EOS ABS: 0.2 10*3/uL (ref 0.0–0.5)
Eosinophils Relative: 1 %
HCT: 28.6 % — ABNORMAL LOW (ref 39.0–52.0)
Hemoglobin: 9.3 g/dL — ABNORMAL LOW (ref 13.0–17.0)
IMMATURE GRANULOCYTES: 1 %
Lymphocytes Relative: 4 %
Lymphs Abs: 0.7 10*3/uL (ref 0.7–4.0)
MCH: 29.1 pg (ref 26.0–34.0)
MCHC: 32.5 g/dL (ref 30.0–36.0)
MCV: 89.4 fL (ref 80.0–100.0)
Monocytes Absolute: 1.1 10*3/uL — ABNORMAL HIGH (ref 0.1–1.0)
Monocytes Relative: 6 %
NEUTROS PCT: 88 %
NRBC: 0 % (ref 0.0–0.2)
Neutro Abs: 15.3 10*3/uL — ABNORMAL HIGH (ref 1.7–7.7)
PLATELETS: 146 10*3/uL — AB (ref 150–400)
RBC: 3.2 MIL/uL — AB (ref 4.22–5.81)
RDW: 14.6 % (ref 11.5–15.5)
WBC: 17.5 10*3/uL — AB (ref 4.0–10.5)

## 2018-04-29 LAB — BLOOD GAS, ARTERIAL
Acid-Base Excess: 0.5 mmol/L (ref 0.0–2.0)
Acid-Base Excess: 0.1 mmol/L (ref 0.0–2.0)
Acid-Base Excess: 1.3 mmol/L (ref 0.0–2.0)
BICARBONATE: 25.4 mmol/L (ref 20.0–28.0)
BICARBONATE: 25.4 mmol/L (ref 20.0–28.0)
BICARBONATE: 23.9 mmol/L (ref 20.0–28.0)
FIO2: 0.35
FIO2: 35
FIO2: 0.36
MECHANICAL RATE: 20
MECHVT: 450 mL
O2 SAT: 91.3 %
O2 Saturation: 98.4 %
O2 Saturation: 92.3 %
PATIENT TEMPERATURE: 37
PEEP: 5 cmH2O
PEEP: 5 cmH2O
PH ART: 7.38 (ref 7.350–7.450)
PO2 ART: 55 mmHg — AB (ref 83.0–108.0)
PRESSURE SUPPORT: 7 cmH2O
Patient temperature: 37
Patient temperature: 37
pCO2 arterial: 41 mmHg (ref 32.0–48.0)
pCO2 arterial: 43 mmHg (ref 32.0–48.0)
pCO2 arterial: 30 mmHg — ABNORMAL LOW (ref 32.0–48.0)
pH, Arterial: 7.4 (ref 7.350–7.450)
pH, Arterial: 7.51 — ABNORMAL HIGH (ref 7.350–7.450)
pO2, Arterial: 113 mmHg — ABNORMAL HIGH (ref 83.0–108.0)
pO2, Arterial: 66 mmHg — ABNORMAL LOW (ref 83.0–108.0)

## 2018-04-29 LAB — COMPREHENSIVE METABOLIC PANEL
ALBUMIN: 2.6 g/dL — AB (ref 3.5–5.0)
ALT: 30 U/L (ref 0–44)
AST: 66 U/L — ABNORMAL HIGH (ref 15–41)
Alkaline Phosphatase: 58 U/L (ref 38–126)
Anion gap: 10 (ref 5–15)
BUN: 33 mg/dL — ABNORMAL HIGH (ref 8–23)
CO2: 26 mmol/L (ref 22–32)
Calcium: 8.4 mg/dL — ABNORMAL LOW (ref 8.9–10.3)
Chloride: 104 mmol/L (ref 98–111)
Creatinine, Ser: 2.05 mg/dL — ABNORMAL HIGH (ref 0.61–1.24)
GFR calc non Af Amer: 30 mL/min — ABNORMAL LOW (ref >60)
GFR, EST AFRICAN AMERICAN: 35 mL/min — AB (ref >60)
Glucose, Bld: 108 mg/dL — ABNORMAL HIGH (ref 70–99)
POTASSIUM: 3.5 mmol/L (ref 3.5–5.1)
SODIUM: 140 mmol/L (ref 135–145)
Total Bilirubin: 0.6 mg/dL (ref 0.3–1.2)
Total Protein: 5.5 g/dL — ABNORMAL LOW (ref 6.5–8.1)

## 2018-04-29 LAB — CULTURE, RESPIRATORY W GRAM STAIN: Culture: NORMAL

## 2018-04-29 LAB — BASIC METABOLIC PANEL WITH GFR
Anion gap: 13 (ref 5–15)
BUN: 31 mg/dL — ABNORMAL HIGH (ref 8–23)
CO2: 25 mmol/L (ref 22–32)
Calcium: 8.6 mg/dL — ABNORMAL LOW (ref 8.9–10.3)
Chloride: 104 mmol/L (ref 98–111)
Creatinine, Ser: 1.7 mg/dL — ABNORMAL HIGH (ref 0.61–1.24)
GFR calc Af Amer: 43 mL/min — ABNORMAL LOW (ref >60)
GFR calc non Af Amer: 37 mL/min — ABNORMAL LOW (ref >60)
Glucose, Bld: 132 mg/dL — ABNORMAL HIGH (ref 70–99)
Potassium: 3.9 mmol/L (ref 3.5–5.1)
Sodium: 142 mmol/L (ref 135–145)

## 2018-04-29 LAB — PROTEIN / CREATININE RATIO, URINE
CREATININE, URINE: 34 mg/dL
PROTEIN CREATININE RATIO: 0.32 mg/mg{creat} — AB (ref 0.00–0.15)
Total Protein, Urine: 11 mg/dL

## 2018-04-29 LAB — GLUCOSE, CAPILLARY
GLUCOSE-CAPILLARY: 127 mg/dL — AB (ref 70–99)
Glucose-Capillary: 100 mg/dL — ABNORMAL HIGH (ref 70–99)
Glucose-Capillary: 135 mg/dL — ABNORMAL HIGH (ref 70–99)
Glucose-Capillary: 144 mg/dL — ABNORMAL HIGH (ref 70–99)

## 2018-04-29 LAB — AMMONIA: Ammonia: 21 umol/L (ref 9–35)

## 2018-04-29 LAB — PROTIME-INR
INR: 1.12
PROTHROMBIN TIME: 14.3 s (ref 11.4–15.2)

## 2018-04-29 LAB — MAGNESIUM
MAGNESIUM: 1.9 mg/dL (ref 1.7–2.4)
Magnesium: 2 mg/dL (ref 1.7–2.4)

## 2018-04-29 LAB — BRAIN NATRIURETIC PEPTIDE: B NATRIURETIC PEPTIDE 5: 889 pg/mL — AB (ref 0.0–100.0)

## 2018-04-29 LAB — CULTURE, RESPIRATORY

## 2018-04-29 LAB — HEPARIN LEVEL (UNFRACTIONATED): Heparin Unfractionated: 0.49 IU/mL (ref 0.30–0.70)

## 2018-04-29 MED ORDER — HEPARIN BOLUS VIA INFUSION
3800.0000 [IU] | Freq: Once | INTRAVENOUS | Status: AC
  Administered 2018-04-29: 3800 [IU] via INTRAVENOUS
  Filled 2018-04-29: qty 3800

## 2018-04-29 MED ORDER — METOPROLOL TARTRATE 5 MG/5ML IV SOLN
5.0000 mg | Freq: Four times a day (QID) | INTRAVENOUS | Status: DC
  Administered 2018-04-29 – 2018-05-04 (×15): 5 mg via INTRAVENOUS
  Filled 2018-04-29 (×16): qty 5

## 2018-04-29 MED ORDER — IPRATROPIUM BROMIDE 0.02 % IN SOLN
0.5000 mg | Freq: Four times a day (QID) | RESPIRATORY_TRACT | Status: DC
  Administered 2018-04-29 – 2018-04-30 (×5): 0.5 mg via RESPIRATORY_TRACT
  Filled 2018-04-29 (×6): qty 2.5

## 2018-04-29 MED ORDER — METHYLPREDNISOLONE SODIUM SUCC 40 MG IJ SOLR
40.0000 mg | Freq: Two times a day (BID) | INTRAMUSCULAR | Status: DC
  Administered 2018-04-29 – 2018-04-30 (×3): 40 mg via INTRAVENOUS
  Filled 2018-04-29 (×3): qty 1

## 2018-04-29 MED ORDER — OLANZAPINE 10 MG IM SOLR
5.0000 mg | Freq: Once | INTRAMUSCULAR | Status: AC
  Administered 2018-04-29: 5 mg via INTRAMUSCULAR
  Filled 2018-04-29: qty 10

## 2018-04-29 MED ORDER — DILTIAZEM HCL 25 MG/5ML IV SOLN
10.0000 mg | Freq: Once | INTRAVENOUS | Status: AC
  Administered 2018-04-29: 10 mg via INTRAVENOUS

## 2018-04-29 MED ORDER — FUROSEMIDE 10 MG/ML IJ SOLN
60.0000 mg | Freq: Once | INTRAMUSCULAR | Status: AC
  Administered 2018-04-29: 60 mg via INTRAVENOUS
  Filled 2018-04-29: qty 6

## 2018-04-29 MED ORDER — DILTIAZEM HCL 25 MG/5ML IV SOLN
INTRAVENOUS | Status: AC
  Administered 2018-04-29: 10 mg via INTRAVENOUS
  Filled 2018-04-29: qty 5

## 2018-04-29 MED ORDER — POTASSIUM CHLORIDE 10 MEQ/100ML IV SOLN
10.0000 meq | INTRAVENOUS | Status: AC
  Administered 2018-04-29 (×3): 10 meq via INTRAVENOUS
  Filled 2018-04-29 (×4): qty 100

## 2018-04-29 MED ORDER — DEXMEDETOMIDINE HCL IN NACL 400 MCG/100ML IV SOLN
0.4000 ug/kg/h | INTRAVENOUS | Status: DC
  Administered 2018-04-29 (×2): 0.5 ug/kg/h via INTRAVENOUS
  Administered 2018-04-30: 0.9 ug/kg/h via INTRAVENOUS
  Administered 2018-04-30: 0.8 ug/kg/h via INTRAVENOUS
  Administered 2018-04-30: 0.5 ug/kg/h via INTRAVENOUS
  Administered 2018-04-30: 1 ug/kg/h via INTRAVENOUS
  Filled 2018-04-29 (×7): qty 100

## 2018-04-29 MED ORDER — LORAZEPAM 2 MG/ML IJ SOLN
1.0000 mg | Freq: Once | INTRAMUSCULAR | Status: AC
  Administered 2018-04-29: 1 mg via INTRAVENOUS
  Filled 2018-04-29: qty 1

## 2018-04-29 MED ORDER — SODIUM CHLORIDE 0.9 % IV SOLN
3.0000 g | Freq: Four times a day (QID) | INTRAVENOUS | Status: DC
  Administered 2018-04-29 – 2018-05-06 (×27): 3 g via INTRAVENOUS
  Filled 2018-04-29 (×31): qty 3

## 2018-04-29 MED ORDER — HEPARIN (PORCINE) 25000 UT/250ML-% IV SOLN
1200.0000 [IU]/h | INTRAVENOUS | Status: DC
  Administered 2018-04-29 – 2018-05-01 (×3): 1200 [IU]/h via INTRAVENOUS
  Filled 2018-04-29 (×3): qty 250

## 2018-04-29 NOTE — Care Management Note (Signed)
Case Management Note  Patient Details  Name: Matthew Zamora MRN: [id number] Date of Birth: 01-14-1941  Subjective/Objective:      Patient admitted to the hospital for acute respiratory failure.  Patient was extubated this morning and is tolerating [place].  Son [name] and son's wife Audelia Acton are at the bedside.  Patient is confused and delirious.  Son reports that his father's current condition is far from baseline- before this hospital admission the patient was living alone completely independent in ADL's and driving.  RNCM consulted for possible home health needs at discharge.  Patient is unable to speak with RNCM at this time due to confusion but son is next of kin- son would be agreeable to have home health- RNCM offered choice- list of Medicare approved [place] agencies and list of personal care services agencies given.  RNCM informed the son that home health is covered by Medicare but long term personal care services would be an out of pocked expense.  Son and wife will review the list.   RNCM will cont to follow for discharge planning needs.  Doran Clay RN, BSN [phone]                 Action/Plan:   Expected Discharge Date:                  Expected Discharge Plan:     In-House Referral:     Discharge planning Services  CM Consult  Post Acute Care Choice:    Choice offered to:     DME Arranged:    DME Agency:     HH Arranged:    HH Agency:     Status of Service:  In process, will continue to follow  If discussed at Long Length of Stay Meetings, dates discussed:    Additional Comments:  Shelbie Hutching, RN 04/29/2018, 1:27 PM

## 2018-04-29 NOTE — Progress Notes (Signed)
Matthew Zamora for heparin dosing for this 62 YOM admitted on 11/10 with acute AMS/delirium and suspected pneumonia. Developed AFRVR on 11/11 requiring anticoagulation.  Indication: Anticoagulation-Afib   No Known Allergies  Patient Measurements: Height: 5\' 11"  (180.3 cm) Weight: 183 lb 13.8 oz (83.4 kg) IBW/kg (Calculated) : 75.3 Heparin Dosing Weight: 83.4 kg (actual BW)  Vital Signs: Temp: 99.5 F (37.5 C) (11/13 1300) Temp Source: Bladder (11/13 0800) BP: 121/80 (11/13 1300) Pulse Rate: 128 (11/13 1300)  Labs: Recent Labs    04/27/18 0948  04/27/18 1125 04/27/18 1732 04/28/18 0313 04/29/18 0302  HGB 9.4*  --   --   --  10.6* 9.3*  HCT 30.0*  --   --   --  33.1* 28.6*  PLT 142*  --   --   --  160 146*  LABPROT  --   --   --   --   --  14.3  INR  --   --   --   --   --  1.12  CREATININE  --    < > 1.26* 1.81* 2.52* 2.05*  TROPONINI  --   --  0.43* 0.57* 0.44*  --    < > = values in this interval not displayed.    Estimated Creatinine Clearance: 32.7 mL/min (A) (by C-G formula based on SCr of 2.05 mg/dL (H)).   Medical History: Past Medical History:  Diagnosis Date  . Abdominal aortic aneurysm (AAA) ([place]) 05/13/15   seen on ct scan  . 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 Anemia   . 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 CAD (coronary artery disease)   . Cataract cortical, senile   . CHF (congestive heart failure) ([place])   . Chronic hoarseness   . Exocrine pancreatic insufficiency   . H. pylori infection   . History of hepatitis   . Hyperlipidemia   . Hypertension   . 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 Prostate CA [facility])     Assessment: Patient will require anticoagulation for stroke prevention related to newly developed A-fib. CHA2DS2-VASc is at least 4 (age >2, HTN, CHF). Patient was given heparin 5000 Units SubQ Q8h. Last dose given 11/13 @ 0538.   Goal of Therapy:  Heparin level goal = 0.3-0.7  Plan:   Will give heparin 3800 Units bolus dose followed by heparin 1200 Units/hr continuous infusion.  Obtain heparin level 8 hours after the first dose. Monitor heparin level and CBC/platelets daily.   Suzzanne Cloud 04/29/2018,1:59 PM

## 2018-04-29 NOTE — Progress Notes (Signed)
-  Patient is extubated and doing well, patient was little bit anxious and agitated and restless initially tried Zyprexa with limited relief, as patient was in A. fib and was throwing PVCs we consider placing the patient on Precedex drip -BMP and mag was rechecked which was essentially okay -Cardiology is also following the patient

## 2018-04-29 NOTE — Progress Notes (Addendum)
Pharmacy Antibiotic Note  Matthew Zamora is a 77 y.o. male admitted on 04/26/2018 with respiratory distress. Patient was admitted via EMS from home. Patient was intubated and chest x-ray is positive for pneumonia. WBCwent down to 17.5 on 11/13. Cultures have been negative so far, no fever noted.  Pharmacy has been consulted for Vancomycin and Unasyn dosing.  Plan: Will continue Vancomycin 1000 mg IV Q24h. Adjust Unasyn dose to Unasyn 3 g IV Q6h.   Goal trough is 15-20. Will obtain trough as clinically indicated.   Vancomycin kinetics  Using actual body weight 86.1 kg, estimated creatinine clearance 33 ml/min Vd: 60.27 L Ke: 0.026 T1/2: 26.65 hours   Height: 5\' 11"  (180.3 cm) Weight: 183 lb 13.8 oz (83.4 kg) IBW/kg (Calculated) : 75.3  Temp (24hrs), Avg:98.5 F (36.9 C), Min:96.8 F (36 C), Max:99.5 F (37.5 C)  Recent Labs  Lab 04/26/18 0030 04/26/18 0154 04/26/18 0447 04/27/18 0948 04/27/18 1125 04/27/18 1732 04/28/18 0313 04/29/18 0302  WBC 13.2*  --  14.8* 20.9*  --   --  29.6* 17.5*  CREATININE 1.85*  --  1.73*  --  1.26* 1.81* 2.52* 2.05*  LATICACIDVEN  --  1.4 1.6  --   --   --   --   --     Estimated Creatinine Clearance: 32.7 mL/min (A) (by C-G formula based on SCr of 2.05 mg/dL (H)).    No Known Allergies  Antimicrobials this admission: Unasyn 11/12 >> Vancomycin 11/12 >>  Azithromycin 11/10 >> 11/12 Ceftriaxone 11/10 >> 11/12  Dose adjustments this admission: 11/13 Unasyn 3g Q12h >>> Unasyn 3g Q6h  Microbiology results: 11/10 BCx: NG x 3 days 11/11 Sputum: normal flora (11/12) 11/10 MRSA PCR: negative 11/12 BCx: NG < 24h  Thank you for allowing pharmacy to be a part of this patient's care.  Suzzanne Cloud 04/29/2018 1:52 PM

## 2018-04-29 NOTE — Consult Note (Signed)
Cardiology Consultation:   Patient ID: Matthew Zamora MRN: [id number]; DOB: [birth year]  Admit date: 04/26/2018 Date of Consult: 04/29/2018  Primary Care Provider: Maryland Pink, MD Primary Cardiologist: [facility] Primary Electrophysiologist:  [facility]   [name] is a 77 y.o. male with a hx of coronary artery disease, chronic systolic heart failure, ventricular tachycardia status post ICD, and chronic kidney disease stage III, who is being seen today for the evaluation of true fibrillation with rapid ventricular response at the request of Dr. Manuella Ghazi.  History of Present Illness:   Matthew Zamora was extubated this morning and remains confused.  History is obtained from the chart and [name] at the bedside.  Patient was reportedly in his usual state of health until 4 days ago, when he was found to be outdoors, confused by his neighbor.  EMS was summoned and the patient subsequently intubated due to respiratory distress.  Hospital course was complicated by hypotension due to presumed septic shock as well as paroxysmal atrial fibrillation with rapid ventricular response.  Patient was successfully weaned from the ventilator earlier today and is now extubated to nasal cannula.  Matthew Zamora does not report any complaints of chest pain or shortness of breath leading up to the events 4 days ago.  He was last seen by his cardiologist at [facility], Dr. Luana Shu, in February after an episode of VT.  Subsequent cardiac catheterization showed severe diffuse RCA disease requiring placement of 3 drug-eluting stents.  Patient's Zamora does not report a history of atrial fibrillation, though most recent device interrogation from [facility] in 02/2018 makes note of new onset paroxysmal atrial fibrillation dating back to 12/2017.  Past Medical History:  Diagnosis Date  . Abdominal aortic aneurysm (AAA) ([place]) 05/13/15   seen on ct scan  . Adenomatous colon polyp 03/18/2001, 03/14/2009,  10/06/2014  . Anemia   . 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 CAD (coronary artery disease)   . Cataract cortical, senile   . CHF (congestive heart failure) ([place])   . Chronic hoarseness   . Exocrine pancreatic insufficiency   . H. pylori infection   . History of hepatitis   . Hyperlipidemia   . Hypertension   . 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 Prostate CA [facility])     Past Surgical History:  Procedure Laterality Date  . CATARACT EXTRACTION    . COLONOSCOPY  10/06/2014, 09/18/2004, 03/14/2009  . ESOPHAGOGASTRODUODENOSCOPY  10/06/2014, 03/18/2001, 03/14/2009  . FLEXIBLE SIGMOIDOSCOPY  08/26/1990  . INSERTION OF ICD    . PROSTATE SURGERY    . TONSILLECTOMY       Home Medications:  Prior to Admission medications   Medication Sig Start Date Mikesha Migliaccio Date Taking? Authorizing Provider  aspirin EC 81 MG tablet Take 81 mg by mouth daily.   Yes [provider]  atorvastatin (LIPITOR) 40 MG tablet Take 40 mg by mouth daily.   Yes [provider]  clopidogrel (PLAVIX) 75 MG tablet Take 75 mg by mouth daily. 04/15/18  Yes [provider]  furosemide (LASIX) 40 MG tablet Take 20 mg by mouth daily.   Yes [provider]  lisinopril (PRINIVIL,ZESTRIL) 5 MG tablet Take 5 mg by mouth daily.   Yes [provider]  magnesium (MAGTAB) 84 MG (7MEQ) TBCR SR tablet Take 84 mg by mouth daily.   Yes [provider]  Melatonin 1 MG TABS Take 1 tablet by mouth at bedtime as needed. 12/27/13  Yes [provider]  metoprolol  succinate (TOPROL-XL) 100 MG 24 hr tablet Take 100 mg by mouth daily. Take with or immediately following a meal.   Yes [provider]  Multiple Vitamins-Minerals (MULTIVITAMIN WITH MINERALS) tablet Take 1 tablet by mouth daily.   Yes [provider]  omeprazole (PRILOSEC) 20 MG capsule Take 20 mg by mouth daily.   Yes [provider]  oxybutynin (DITROPAN XL) 15 MG 24 hr tablet Take 15 mg by mouth at bedtime.   Yes  [provider]  lipase/protease/amylase (CREON) 12000 units CPEP capsule Take 24,000 Units by mouth 2 (two) times daily at 10 am and 4 pm.    [provider]    Inpatient Medications: Scheduled Meds: . aspirin  81 mg Per Tube Daily  . atorvastatin  40 mg Per Tube Daily  . chlorhexidine gluconate (MEDLINE KIT)  15 mL Mouth Rinse BID  . clopidogrel  75 mg Per Tube Daily  . feeding supplement (PRO-STAT SUGAR FREE 64)  30 mL Per Tube TID  . ipratropium  0.5 mg Nebulization Q6H  . lipase/protease/amylase  12,000 Units Oral Q8H  . mouth rinse  15 mL Mouth Rinse 10 times per day  . methylPREDNISolone (SOLU-MEDROL) injection  40 mg Intravenous BID  . metoprolol tartrate  5 mg Intravenous Q6H  . pantoprazole (PROTONIX) IV  40 mg Intravenous Daily  . sodium chloride flush  10-40 mL Intracatheter Q12H   Continuous Infusions: . amiodarone 60 mg/hr (04/29/18 1242)  . ampicillin-sulbactam (UNASYN) IV    . dextrose 5% lactated ringers 50 mL/hr at 04/29/18 1235  . feeding supplement (VITAL 1.5 CAL) Stopped (04/27/18 1233)  . heparin 1,200 Units/hr (04/29/18 1229)  . phenylephrine (NEO-SYNEPHRINE) Adult infusion Stopped (04/29/18 0846)  . vancomycin 1,000 mg (04/29/18 1215)   PRN Meds: acetaminophen **OR** acetaminophen, bisacodyl, bisacodyl, ondansetron **OR** ondansetron (ZOFRAN) IV, sennosides, sodium chloride flush  Allergies:   No Known Allergies  Social History:   Social History   Socioeconomic History  . Marital status: Single    Spouse name: Not on file  . Number of children: Not on file  . Years of education: Not on file  . Highest education level: Not on file  Occupational History  . Not on file  Social Needs  . Financial resource strain: Not on file  . Food insecurity:    Worry: Not on file    Inability: Not on file  . Transportation needs:    Medical: Not on file    Non-medical: Not on file  Tobacco Use  . Smoking status: Former Smoker    Packs/day:  1.00    Years: 38.00    Pack years: 38.00    Types: Cigarettes    Last attempt to quit: 06/18/1999    Years since quitting: 18.8  . Smokeless tobacco: Never Used  Substance and Sexual Activity  . Alcohol use: No    Alcohol/week: 0.0 standard drinks  . Drug use: Not on file  . Sexual activity: Not on file  Lifestyle  . Physical activity:    Days per week: Not on file    Minutes per session: Not on file  . Stress: Not on file  Relationships  . Social connections:    Talks on phone: Not on file    Gets together: Not on file    Attends religious service: Not on file    Active member of club or organization: Not on file    Attends meetings of clubs or organizations: Not on file  Relationship status: Not on file  . Intimate partner violence:    Fear of current or ex partner: Not on file    Emotionally abused: Not on file    Physically abused: Not on file    Forced sexual activity: Not on file  Other Topics Concern  . Not on file  Social History Narrative  . Not on file    Zamora History:   Zamora History  Problem Relation Age of Onset  . Heart attack Mother   . Heart attack Father      ROS:  Review of Systems  Unable to perform ROS: Mental acuity   Physical Exam/Data:   Vitals:   04/29/18 0800 04/29/18 0900 04/29/18 1000 04/29/18 1100  BP: (!) 101/57 (!) 132/108 124/63 106/70  Pulse: 99 (!) 131 (!) 145 (!) 150  Resp: (!) 7 (!) 23 (!) 21 20  Temp: 98.1 F (36.7 C) 99.3 F (37.4 C) 99 F (37.2 C) 99 F (37.2 C)  TempSrc: Bladder     SpO2: 100% 95% 93% 96%  Weight:      Height:        Intake/Output Summary (Last 24 hours) at 04/29/2018 1303 Last data filed at 04/29/2018 1055 Gross per 24 hour  Intake 2824.53 ml  Output 1785 ml  Net 1039.53 ml   Filed Weights   04/27/18 0500 04/28/18 0500 04/29/18 0305  Weight: 82.8 kg 86.1 kg 83.4 kg   Body mass index is 25.64 kg/m.  General:  Well nourished, well developed man.  He is seated in bed and appears  confused and fidgety. HEENT: normal Lymph: no adenopathy Neck: no JVD Endocrine:  No thryomegaly Vascular: No carotid bruits; FA pulses 2+ bilaterally without bruits  Cardiac: Tachycardic and irregularly irregular without murmurs. Lungs: Poor inspiratory effort with diminished breath sounds throughout. Abd: soft, nontender, no hepatomegaly  Ext: no edema Musculoskeletal:  No deformities, BUE and BLE strength normal and equal Skin: warm and dry  Neuro: Patient moves all 4 extremities but is unable to follow commands. Psych: Unable to assess, as patient does not answer questions or follow commands.  EKG:  The EKG performed 04/26/2018 was personally reviewed and demonstrates: Probable sinus tachycardia with PVCs and nonspecific ST segment changes.  Baseline artifact limits evaluation. Telemetry:  Telemetry was personally reviewed and demonstrates: Sinus rhythm with occasional ventricular pacing and intermittent episodes of atrial fibrillation with rapid ventricular response.  Ventricular rates up to 160 bpm.  Relevant CV Studies:  TTE (04/29/18): - Left ventricle: The cavity size was normal. There was moderate   focal basal hypertrophy of the septum. Systolic function was   mildly to moderately reduced. The estimated ejection fraction was   in the range of 40% to 45%. Images were inadequate for LV wall   motion assessment. The study is not technically sufficient to   allow evaluation of LV diastolic function. - Mitral valve: There was mild regurgitation. - Left atrium: The atrium was moderately dilated. - Right atrium: The atrium was mildly dilated. - Pulmonary arteries: Systolic pressure was moderately increased.   PA peak pressure: 50 mm Hg (S). - Inferior vena cava: The vessel was dilated. The respirophasic   diameter changes were blunted (< 50%), consistent with elevated   central venous pressure.  TTE (04/29/2017, [facility]): Technically difficult study.  LVEF approximately 45% with  posterior and basal inferior wall motion abnormality.  Mild mitral regurgitation.  Normal RV size and function.  Laboratory Data:  Chemistry Recent Labs  Lab  04/27/18 1732 04/28/18 0313 04/29/18 0302  NA 133* 137 140  K 4.0 4.0 3.5  CL 101 103 104  CO2 21* 24 26  GLUCOSE 152* 122* 108*  BUN 25* 33* 33*  CREATININE 1.81* 2.52* 2.05*  CALCIUM 6.8* 8.2* 8.4*  GFRNONAA 35* 23* 30*  GFRAA 40* 27* 35*  ANIONGAP '11 10 10    ' Recent Labs  Lab 04/26/18 0030 04/28/18 0313 04/29/18 0302  PROT 7.5 5.8* 5.5*  ALBUMIN 4.2 3.0* 2.6*  AST 24 74* 66*  ALT '16 28 30  ' ALKPHOS 79 68 58  BILITOT 0.7 0.6 0.6   Hematology Recent Labs  Lab 04/27/18 0948 04/28/18 0313 04/29/18 0302  WBC 20.9* 29.6* 17.5*  RBC 3.18* 3.56* 3.20*  HGB 9.4* 10.6* 9.3*  HCT 30.0* 33.1* 28.6*  MCV 94.3 93.0 89.4  MCH 29.6 29.8 29.1  MCHC 31.3 32.0 32.5  RDW 13.9 14.2 14.6  PLT 142* 160 146*   Cardiac Enzymes Recent Labs  Lab 04/26/18 0030 04/26/18 0447 04/27/18 1125 04/27/18 1732 04/28/18 0313  TROPONINI 0.03* 0.16* 0.43* 0.57* 0.44*   No results for input(s): TROPIPOC in the last 168 hours.  BNP Recent Labs  Lab 04/26/18 0030 04/27/18 1054 04/29/18 0302  BNP 717.0* 799.0* 889.0*    DDimer No results for input(s): DDIMER in the last 168 hours.  Radiology/Studies:  Dg Abd 1 View  Result Date: 04/27/2018 CLINICAL DATA:  Abdominal distension EXAM: ABDOMEN - 1 VIEW COMPARISON:  CT abdomen and pelvis May 16, 2015 FINDINGS: Nasogastric tube tip and side port are in the stomach. There is moderate stool in the colon. There is no appreciable bowel dilatation or air-fluid level to suggest bowel obstruction. No free air. There are multiple surgical clips in the pelvis. Visualized lung bases are clear. IMPRESSION: Nasogastric tube tip and side port in stomach. No bowel obstruction or free air evident. Moderate stool in colon. Multiple surgical clips in pelvis. Electronically Signed   By: Lowella Grip III M.D.   On: 04/27/2018 20:11   Ct Head Wo Contrast  Result Date: 04/28/2018 CLINICAL DATA:  Altered mental status EXAM: CT HEAD WITHOUT CONTRAST TECHNIQUE: Contiguous axial images were obtained from the base of the skull through the vertex without intravenous contrast. COMPARISON:  None. FINDINGS: Brain: No acute intracranial abnormality. Specifically, no hemorrhage, hydrocephalus, mass lesion, acute infarction, or significant intracranial injury. Vascular: No hyperdense vessel or unexpected calcification. Skull: No acute calvarial abnormality. Sinuses/Orbits: Visualized paranasal sinuses and mastoids clear. Orbital soft tissues unremarkable. Other: None IMPRESSION: No intracranial abnormality. Electronically Signed   By: Rolm Baptise M.D.   On: 04/28/2018 12:18   US Renal  Result Date: 04/29/2018 CLINICAL DATA:  Acute renal failure EXAM: RENAL / URINARY TRACT ULTRASOUND COMPLETE COMPARISON:  Abdominal CT 05/16/2015 FINDINGS: Right Kidney: Renal measurements: 10 x 4 x 4 cm = volume: 87 mL. Simple cyst in the lower pole measuring 21 mm. No hydronephrosis or solid mass. Left Kidney: Renal measurements: 10 x 4 x 4 cm = volume: 86 mL. Echogenicity within normal limits. No mass or hydronephrosis visualized. Bladder: Appears normal for degree of bladder distention. Incidental lobulated but benign appearing cysts in the right liver, known from abdominal CT in 2016. Small right pleural effusion. IMPRESSION: 1. No acute finding or hydronephrosis. 2. Symmetric renal size with 80-90 cc volume. Electronically Signed   By: Monte Fantasia M.D.   On: 04/29/2018 10:25   Dg Chest Port 1 View  Result Date: 04/29/2018 CLINICAL DATA:  Acute respiratory failure, shortness of breath, history of CHF, former smoker. EXAM: PORTABLE CHEST 1 VIEW COMPARISON:  Portable chest x-ray of April 28, 2018 FINDINGS: The endotracheal tube tip projects 3.1 cm above the carina. The lungs are reasonably well inflated. Patchy  airspace opacities are present in the right mid and lower lung. The right hemidiaphragm is largely obscured and there is partial obscuration of the left hemidiaphragm. The esophagogastric tube tip in proximal port project below the GE junction. The cardiac silhouette is mildly enlarged but stable. The pulmonary vascularity is prominent centrally but there is no cephalization. The right-sided PICC line tip projects at the midportion of the SVC. The ICD is in stable position. IMPRESSION: Slightly improved appearance of the lungs with slightly decreased bibasilar densities. Persistent small bilateral pleural effusions and bibasilar atelectasis or pneumonia. Stable mild cardiac enlargement without pulmonary edema. The support tubes are in reasonable position. Electronically Signed   By: David  Martinique M.D.   On: 04/29/2018 08:21   Dg Chest Port 1 View  Result Date: 04/28/2018 CLINICAL DATA:  Respiratory failure EXAM: PORTABLE CHEST 1 VIEW COMPARISON:  04/27/2018 FINDINGS: Endotracheal tube remains in good position. NG tube enters the stomach. AICD in good position. Right arm PICC tip in the SVC unchanged. Bibasilar airspace disease and bilateral effusions unchanged. Pulmonary vascular congestion unchanged. IMPRESSION: Bibasilar airspace disease and bilateral effusions unchanged. Support lines remain in good position. Electronically Signed   By: Franchot Gallo M.D.   On: 04/28/2018 07:01   Portable Chest Xray  Result Date: 04/27/2018 CLINICAL DATA:  Acute respiratory failure EXAM: PORTABLE CHEST 1 VIEW COMPARISON:  04/26/2018 FINDINGS: Endotracheal tube and nasogastric catheter are again seen in satisfactory position. Defibrillator is again noted and stable. Right-sided PICC line has been placed in the interval in satisfactory position. Increasing density is noted over the bases bilaterally consistent with small posteriorly layering effusions. These are slightly accentuated due to the patient's positioning.  Stable parenchymal opacities are noted in the left base. No pneumothorax is noted. IMPRESSION: Stable parenchymal infiltrate in the left base. Small effusions are noted. Right-sided PICC line in satisfactory position. Electronically Signed   By: Inez Catalina M.D.   On: 04/27/2018 07:07   Dg Chest Portable 1 View  Result Date: 04/26/2018 CLINICAL DATA:  Acute onset of hypotension. Evaluate for pneumothorax. EXAM: PORTABLE CHEST 1 VIEW COMPARISON:  Chest radiograph performed earlier today at 12:42 a.m. FINDINGS: The patient's endotracheal tube is seen ending 4-5 cm above the carina. And enteric tube is noted extending below the diaphragm. No pneumothorax is seen. The hazy left-sided airspace opacity is again noted, which may reflect pneumonia or interstitial edema. No significant pleural effusion or pneumothorax is seen. Vascular congestion is noted. The cardiomediastinal silhouette is normal in size. A pacemaker/AICD is noted overlying the left chest wall, with leads ending overlying the right atrium and right ventricle. No acute osseous abnormalities are seen. IMPRESSION: 1. No evidence of pneumothorax. 2. Endotracheal tube seen ending 4-5 cm above the carina. 3. Hazy left-sided airspace opacity again noted, which may reflect pneumonia or interstitial edema. Vascular congestion noted. Electronically Signed   By: Garald Balding M.D.   On: 04/26/2018 01:47   Dg Chest Portable 1 View  Result Date: 04/26/2018 CLINICAL DATA:  Endotracheal tube placement and orogastric tube placement. Assess for pneumonia. EXAM: PORTABLE CHEST 1 VIEW COMPARISON:  Chest radiograph performed 10/16/2008 FINDINGS: The patient's endotracheal tube is seen ending 4 cm above the carina. An enteric tube is noted extending  below the diaphragm. Patchy left-sided airspace opacity may reflect worsening pneumonia or asymmetric pulmonary edema. No definite pleural effusion or pneumothorax is seen. Underlying vascular congestion is noted. The  cardiomediastinal silhouette is normal in size. A pacemaker/AICD is noted overlying the left chest wall, with leads ending overlying the right atrium and right ventricle. No acute osseous abnormalities are identified. IMPRESSION: 1. Endotracheal tube seen ending 4 cm above the carina. 2. Patchy left-sided airspace opacity may reflect worsening pneumonia or asymmetric pulmonary edema. Underlying vascular congestion noted. Electronically Signed   By: Garald Balding M.D.   On: 04/26/2018 01:09   [place] Ekg Site Rite  Result Date: 04/26/2018 If Site Rite image not attached, placement could not be confirmed due to current cardiac rhythm.   Assessment and Plan:   Paroxysmal atrial fibrillation Patient has had episodes of atrial fibrillation with rapid ventricular response during this hospitalization, likely exacerbated by his acute illness.  Reviewing the [facility] records, it appears that he has had paroxysmal atrial fibrillation detected by his defibrillator dating back to at least July.  He is not on anticoagulation or antiarrhythmic therapy at home.  Given hypotension during this admission and acute on chronic kidney injury, rate control strategies are limited.  He is currently on an amiodarone infusion.  Though not ideal, given the lack of anticoagulation, I think that amiodarone infusion is her only reasonable option for the time being.  This should be continued.  Recommend starting heparin infusion for anticoagulation.  There has been a 3 to 4 g drop in hemoglobin since admission on 04/26/2018, though some of this may have been delusional.  Hemoglobin will need to be carefully monitored in the setting of anticoagulation.  Defer cardioversion, given that patient has been in and out of atrial fibrillation during this hospitalization and is unlikely to remain in sinus rhythm until his acute illness improves.  Chronic systolic heart failure LVEF noted to be 40 to 45%, similar to prior outside echo.  Mr.  Zamora appears euvolemic on exam, though exam is challenging due to his altered mental status.  Chest radiographs during this admission shows small bilateral pleural effusions and bibasilar densities.  Recommend maintaining net even to slightly negative fluid balance.  If blood pressure tolerates over the next 24 hours, consider adding low-dose metoprolol succinate.  Defer adding ACE inhibitor or ARB due to hypotension during this admission as well as acute on chronic renal insufficiency.  Coronary artery disease It is difficult to obtain a history from the patient regarding any chest pain leading up to his admission.  Troponin peaked at 0.57 on 04/27/2018, most likely reflecting supply-demand mismatch.  Defer ischemia evaluation, given acute noncardiac illness and renal insufficiency.  Continue aspirin and clopidogrel, given extensive drug-eluting stent placement to the RCA in 07/2017.  Heparin infusion, as above.  Ventricular tachycardia No VT noted during this admission.  Amiodarone for now.  Restart low-dose beta-blocker, as blood pressure tolerates.  Continue outpatient follow-up with [facility].  Acute on chronic renal insufficiency  Maintain net even to slightly negative fluid balance.  Further management per nephrology and internal medicine.   For questions or updates, please contact [place] Please consult [email or web address] for contact info under [facility] Cardiology.  Signed, Nelva Bush, MD  04/29/2018 1:03 PM

## 2018-04-29 NOTE — Progress Notes (Signed)
[facility]  ROUNDING NOTE   Subjective:  Patient remains critically ill. Still on the ventilator. Urine output was 1.7 L over the preceding 24 hours. Creatinine currently 2.05.   Objective:  Vital signs in last 24 hours:  Temp:  [96.8 F (36 C)-99.5 F (37.5 C)] 98.1 F (36.7 C) (11/13 0700) Pulse Rate:  [65-137] 99 (11/13 0800) Resp:  [0-30] 7 (11/13 0800) BP: (91-123)/(47-85) 101/57 (11/13 0800) SpO2:  [96 %-100 %] 100 % (11/13 0800) FiO2 (%):  [35 %] 35 % (11/13 0742) Weight:  [83.4 kg] 83.4 kg (11/13 0305)  Weight change: -2.7 kg Filed Weights   04/27/18 0500 04/28/18 0500 04/29/18 0305  Weight: 82.8 kg 86.1 kg 83.4 kg    Intake/Output: I/O last 3 completed shifts: In: 4369.9 [I.V.:4016.7; IV Piggyback:353.2] Out: 2060 [Urine:1910; Emesis/NG output:150]   Intake/Output this shift:  No intake/output data recorded.  Physical Exam: General: Critically ill appearing  Head: ETT in place  Eyes: Anicteric  Neck: Supple, trachea midline  Lungs:  Scattered rhonchi, vent assisted  Heart: S1S2 no rubs  Abdomen:  Soft, nontender, bowel sounds present  Extremities: no peripheral edema.  Neurologic: Awake, slightly agitated  Skin: No lesions       Basic Metabolic Panel: Recent Labs  Lab 04/26/18 0447 04/26/18 1835 04/27/18 1125 04/27/18 1732 04/28/18 0313 04/29/18 0302  NA 138  --  133* 133* 137 140  K 4.3 4.2 2.9* 4.0 4.0 3.5  CL 104  --  104 101 103 104  CO2 25  --  18* 21* 24 26  GLUCOSE 164*  --  297* 152* 122* 108*  BUN 22  --  18 25* 33* 33*  CREATININE 1.73*  --  1.26* 1.81* 2.52* 2.05*  CALCIUM 8.3*  --  5.1* 6.8* 8.2* 8.4*  MG  --  1.9 1.3*  1.2*  --  1.8 2.0  PHOS  --   --  2.4*  --  4.6  --     Liver Function Tests: Recent Labs  Lab 04/26/18 0030 04/28/18 0313 04/29/18 0302  AST 24 74* 66*  ALT '16 28 30  ' ALKPHOS 79 68 58  BILITOT 0.7 0.6 0.6  PROT 7.5 5.8* 5.5*  ALBUMIN 4.2 3.0* 2.6*   No results for input(s):  LIPASE, AMYLASE in the last 168 hours. Recent Labs  Lab 04/29/18 0302  AMMONIA 21    CBC: Recent Labs  Lab 04/26/18 0030 04/26/18 0447 04/27/18 0948 04/28/18 0313 04/29/18 0302  WBC 13.2* 14.8* 20.9* 29.6* 17.5*  NEUTROABS 7.7  --   --   --  15.3*  HGB 13.0 10.9* 9.4* 10.6* 9.3*  HCT 40.5 35.4* 30.0* 33.1* 28.6*  MCV 91.4 93.2 94.3 93.0 89.4  PLT 237 191 142* 160 146*    Cardiac Enzymes: Recent Labs  Lab 04/26/18 0030 04/26/18 0447 04/27/18 1125 04/27/18 1732 04/28/18 0313  TROPONINI 0.03* 0.16* 0.43* 0.57* 0.44*    BNP: Invalid input(s): POCBNP  CBG: Recent Labs  Lab 04/28/18 0805 04/28/18 1229 04/28/18 1747 04/28/18 2319 04/29/18 0543  GLUCAP 97 96 100* 45 100*    Microbiology: Results for orders placed or performed during the hospital encounter of 04/26/18  Blood culture (routine x 2)     Status: None (Preliminary result)   Collection Time: 04/26/18  1:54 AM  Result Value Ref Range Status   Specimen Description BLOOD LEFT FATTY CASTS  Final   Special Requests   Final    BOTTLES DRAWN AEROBIC AND ANAEROBIC  Blood Culture adequate volume   Culture   Final    NO GROWTH 3 DAYS Performed at [facility], [address] [place], [place] [address]    Report Status PENDING  Incomplete  Blood culture (routine x 2)     Status: None (Preliminary result)   Collection Time: 04/26/18  1:55 AM  Result Value Ref Range Status   Specimen Description BLOOD LEFT ASSIST CONTROL  Final   Special Requests   Final    BOTTLES DRAWN AEROBIC AND ANAEROBIC Blood Culture results may not be optimal due to an excessive volume of blood received in culture bottles   Culture   Final    NO GROWTH 3 DAYS Performed at [facility], [address], [place], [place] [address]    Report Status PENDING  Incomplete  MRSA PCR Screening     Status: None   Collection Time: 04/26/18  2:54 AM  Result Value Ref Range Status   MRSA by PCR NEGATIVE NEGATIVE Final     Comment:        The GeneXpert MRSA Assay (FDA approved for NASAL specimens only), is one component of a comprehensive MRSA colonization surveillance program. It is not intended to diagnose MRSA infection nor to guide or monitor treatment for MRSA infections. Performed at [facility], [address] [place], [place] [address]   Culture, respiratory (non-expectorated)     Status: None (Preliminary result)   Collection Time: 04/27/18 10:06 AM  Result Value Ref Range Status   Specimen Description   Final    TRACHEAL ASPIRATE Performed at [facility], [address] [place], [place] [address]    Special Requests   Final    NONE Performed at [facility], [address] [place], [place] [address]    Gram Stain   Final    FEW WBC PRESENT, PREDOMINANTLY PMN RARE GRAM POSITIVE RODS    Culture   Final    CULTURE REINCUBATED FOR BETTER GROWTH Performed at [facility], [place] [address], [place], [place] [address]    Report Status PENDING  Incomplete  CULTURE, BLOOD (ROUTINE X 2) w Reflex to ID Panel     Status: None (Preliminary result)   Collection Time: 04/28/18 11:52 AM  Result Value Ref Range Status   Specimen Description BLOOD LEFT HAND  Final   Special Requests   Final    BOTTLES DRAWN AEROBIC AND ANAEROBIC Blood Culture results may not be optimal due to an inadequate volume of blood received in culture bottles   Culture   Final    NO GROWTH < 24 HOURS Performed at [facility], [address] [place], [place] [address]    Report Status PENDING  Incomplete  Culture, blood (Routine X 2) w Reflex to ID Panel     Status: None (Preliminary result)   Collection Time: 04/28/18  1:47 PM  Result Value Ref Range Status   Specimen Description BLOOD BLOOD LEFT HAND  Final   Special Requests   Final    BOTTLES DRAWN AEROBIC AND ANAEROBIC Blood Culture adequate volume   Culture   Final    NO GROWTH < 24 HOURS Performed at [facility], [address] [place], [place] [address]    Report Status PENDING  Incomplete    Coagulation Studies: Recent Labs    04/29/18 0302  LABPROT 14.3  INR 1.12    Urinalysis: No results for input(s): COLORURINE, LABSPEC, [place], GLUCOSEU, HGBUR,  BILIRUBINUR, KETONESUR, PROTEINUR, UROBILINOGEN, NITRITE, LEUKOCYTESUR in the last 72 hours.  Invalid input(s): APPERANCEUR    Imaging: Dg Abd 1 View  Result Date: 04/27/2018 CLINICAL DATA:  Abdominal distension EXAM: ABDOMEN - 1 VIEW COMPARISON:  CT abdomen and pelvis May 16, 2015 FINDINGS: Nasogastric tube tip and side port are in the stomach. There is moderate stool in the colon. There is no appreciable bowel dilatation or air-fluid level to suggest bowel obstruction. No free air. There are multiple surgical clips in the pelvis. Visualized lung bases are clear. IMPRESSION: Nasogastric tube tip and side port in stomach. No bowel obstruction or free air evident. Moderate stool in colon. Multiple surgical clips in pelvis. Electronically Signed   By: Lowella Grip III M.D.   On: 04/27/2018 20:11   Ct Head Wo Contrast  Result Date: 04/28/2018 CLINICAL DATA:  Altered mental status EXAM: CT HEAD WITHOUT CONTRAST TECHNIQUE: Contiguous axial images were obtained from the base of the skull through the vertex without intravenous contrast. COMPARISON:  None. FINDINGS: Brain: No acute intracranial abnormality. Specifically, no hemorrhage, hydrocephalus, mass lesion, acute infarction, or significant intracranial injury. Vascular: No hyperdense vessel or unexpected calcification. Skull: No acute calvarial abnormality. Sinuses/Orbits: Visualized paranasal sinuses and mastoids clear. Orbital soft tissues unremarkable. Other: None IMPRESSION: No intracranial abnormality. Electronically Signed   By: Rolm Baptise M.D.   On: 04/28/2018 12:18   Dg Chest Port 1 View  Result Date: 04/29/2018 CLINICAL DATA:  Acute respiratory failure,  shortness of breath, history of CHF, former smoker. EXAM: PORTABLE CHEST 1 VIEW COMPARISON:  Portable chest x-ray of April 28, 2018 FINDINGS: The endotracheal tube tip projects 3.1 cm above the carina. The lungs are reasonably well inflated. Patchy airspace opacities are present in the right mid and lower lung. The right hemidiaphragm is largely obscured and there is partial obscuration of the left hemidiaphragm. The esophagogastric tube tip in proximal port project below the GE junction. The cardiac silhouette is mildly enlarged but stable. The pulmonary vascularity is prominent centrally but there is no cephalization. The right-sided PICC line tip projects at the midportion of the SVC. The ICD is in stable position. IMPRESSION: Slightly improved appearance of the lungs with slightly decreased bibasilar densities. Persistent small bilateral pleural effusions and bibasilar atelectasis or pneumonia. Stable mild cardiac enlargement without pulmonary edema. The support tubes are in reasonable position. Electronically Signed   By: David  Martinique M.D.   On: 04/29/2018 08:21   Dg Chest Port 1 View  Result Date: 04/28/2018 CLINICAL DATA:  Respiratory failure EXAM: PORTABLE CHEST 1 VIEW COMPARISON:  04/27/2018 FINDINGS: Endotracheal tube remains in good position. NG tube enters the stomach. AICD in good position. Right arm PICC tip in the SVC unchanged. Bibasilar airspace disease and bilateral effusions unchanged. Pulmonary vascular congestion unchanged. IMPRESSION: Bibasilar airspace disease and bilateral effusions unchanged. Support lines remain in good position. Electronically Signed   By: Franchot Gallo M.D.   On: 04/28/2018 07:01     Medications:   . amiodarone 30 mg/hr (04/29/18 0600)  . ampicillin-sulbactam (UNASYN) IV Stopped (04/28/18 2207)  . dextrose 5% lactated ringers 50 mL/hr at 04/29/18 0600  . feeding supplement (VITAL 1.5 CAL) Stopped (04/27/18 1233)  . fentaNYL infusion INTRAVENOUS Stopped  (04/27/18 1316)  . phenylephrine (NEO-SYNEPHRINE) Adult infusion 80 mcg/min (04/29/18 0600)  . potassium chloride    . propofol (DIPRIVAN) infusion Stopped (04/29/18 0818)  . vancomycin     . aspirin  81 mg Per Tube Daily  . atorvastatin  40 mg Per Tube Daily  . chlorhexidine gluconate (MEDLINE KIT)  15 mL Mouth Rinse BID  . clopidogrel  75 mg Per Tube Daily  . feeding supplement (PRO-STAT SUGAR FREE 64)  30 mL Per Tube TID  . heparin  5,000 Units Subcutaneous Q8H  . ipratropium  0.5 mg Nebulization Q6H  . lipase/protease/amylase  12,000 Units Oral Q8H  . mouth rinse  15 mL Mouth Rinse 10 times per day  . pantoprazole (PROTONIX) IV  40 mg Intravenous Daily  . sodium chloride flush  10-40 mL Intracatheter Q12H   acetaminophen **OR** acetaminophen, bisacodyl, bisacodyl, fentaNYL, fentaNYL (SUBLIMAZE) injection, fentaNYL (SUBLIMAZE) injection, metoprolol tartrate, ondansetron **OR** ondansetron (ZOFRAN) IV, sennosides, sodium chloride flush  Assessment/ Plan:  77 y.o. male with a PMHx of abdominal aortic aneurysm, coronary artery disease, congestive heart failure, exocrine pancreatic insufficiency, chronic kidney disease stage III baseline creatinine 1.6, prostate cancer, hypertension, who was admitted to [facility] on 04/26/2018 for evaluation of respiratory distress.  1.  Acute renal failure/chronic kidney disease stage III baseline creatinine 1.6.  It appears chronic kidney disease now is multifactorial with contributions from concurrent illness and hypotension.   -Renal ultrasound pending.  Creatinine down to 2.05 with a BUN of 33.  Urine output was 1.7 L over the preceding 24 hours.  Therefore no acute indication for dialysis at the moment.  Continue to monitor renal parameters closely.  2.  Hypotension.    Wean down phenylephrine as tolerated.  Maintain map of 65 or greater.  3.  Acute respiratory failure.  Continue mechanical ventilation for now however weaning is ongoing.   LOS:  3 Jovani Colquhoun 11/13/20198:31 AM

## 2018-04-29 NOTE — Progress Notes (Signed)
Matthew Zamora for heparin dosing for this 65 YOM admitted on 11/10 with acute AMS/delirium and suspected pneumonia. Developed AFRVR on 11/11 requiring anticoagulation.  Indication: Anticoagulation-Afib   No Known Allergies  Patient Measurements: Height: 5\' 11"  (180.3 cm) Weight: 183 lb 13.8 oz (83.4 kg) IBW/kg (Calculated) : 75.3 Heparin Dosing Weight: 83.4 kg (actual BW)  Vital Signs: Temp: 98.2 F (36.8 C) (11/13 2000) BP: 125/63 (11/13 2000) Pulse Rate: 87 (11/13 2000)  Labs: Recent Labs    04/27/18 0948  04/27/18 1125 04/27/18 1732 04/28/18 0313 04/29/18 0302 04/29/18 1725 04/29/18 2025  HGB 9.4*  --   --   --  10.6* 9.3*  --   --   HCT 30.0*  --   --   --  33.1* 28.6*  --   --   PLT 142*  --   --   --  160 146*  --   --   LABPROT  --   --   --   --   --  14.3  --   --   INR  --   --   --   --   --  1.12  --   --   HEPARINUNFRC  --   --   --   --   --   --   --  0.49  CREATININE  --    < > 1.26* 1.81* 2.52* 2.05* 1.70*  --   TROPONINI  --   --  0.43* 0.57* 0.44*  --   --   --    < > = values in this interval not displayed.    Estimated Creatinine Clearance: 39.4 mL/min (A) (by C-G formula based on SCr of 1.7 mg/dL (H)).   Medical History: Past Medical History:  Diagnosis Date  . Abdominal aortic aneurysm (AAA) ([place]) 05/13/15   seen on ct scan  . 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 Anemia   . 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 CAD (coronary artery disease)   . Cataract cortical, senile   . CHF (congestive heart failure) ([place])   . Chronic hoarseness   . Exocrine pancreatic insufficiency   . H. pylori infection   . History of hepatitis   . Hyperlipidemia   . Hypertension   . 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 Prostate [facility])     Assessment: Patient will require anticoagulation for stroke prevention related to newly developed A-fib. CHA2DS2-VASc is at least 4 (age >11, HTN, CHF). Patient was given  heparin 5000 Units SubQ Q8h. Last dose given 11/13 @ 0538.   Goal of Therapy:  Heparin level goal = 0.3-0.7  Plan:  1113 2025 HL 0.49. Will continue heparin 1200 Units/hr and recheck confirmatory level in 8 hours. CBC with AM labs per protocol.    Monitor heparin level and CBC/platelets daily.   Pernell Dupre, PharmD, BCPS Clinical Pharmacist 04/29/2018 9:27 PM

## 2018-04-29 NOTE — Progress Notes (Signed)
Extubated without complications to 4lnc

## 2018-04-29 NOTE — Progress Notes (Signed)
Patient ID: Matthew Zamora, male   DOB: 1941-05-04, 77 y.o.   MRN: [id number]  [facility] PROGRESS NOTE  LUNDY COZART EBR:[id number] DOB: 1941-04-18 DOA: 04/26/2018 PCP: Maryland Pink, MD  HPI/Subjective: Patient agitated after extubation.  Family states he was just given Zyprexa.  Patient constantly trying to adjust himself in the bed.  Objective: Vitals:   04/29/18 1000 04/29/18 1100  BP: 124/63 106/70  Pulse: (!) 145 (!) 150  Resp: (!) 21 20  Temp: 99 F (37.2 C) 99 F (37.2 C)  SpO2: 93% 96%    Filed Weights   04/27/18 0500 04/28/18 0500 04/29/18 0305  Weight: 82.8 kg 86.1 kg 83.4 kg    ROS: Review of Systems  Unable to perform ROS: Intubated   Exam: Physical Exam  HENT:  Nose: No mucosal edema.  Unable to look into mouth  Eyes: Conjunctivae and lids are normal.  Pupils sluggish  Neck: Carotid bruit is not present. No thyromegaly present.  Cardiovascular: S1 normal, S2 normal and normal heart sounds. An irregularly irregular rhythm present. Tachycardia present.  Respiratory: He has decreased breath sounds in the right lower field and the left lower field. He has no wheezes. He has no rhonchi. He has no rales.  GI: Soft. Bowel sounds are normal. There is no tenderness.  Musculoskeletal:       Right ankle: He exhibits no swelling.       Left ankle: He exhibits no swelling.  Neurological:  Patient is agitated and moves all of his extremities on his own but not by command.  Skin: Skin is warm. Nails show no clubbing.  Psychiatric:  Agitated unable to assess      Data Reviewed: Basic Metabolic Panel: Recent Labs  Lab 04/26/18 0447 04/26/18 1835 04/27/18 1125 04/27/18 1732 04/28/18 0313 04/29/18 0302  NA 138  --  133* 133* 137 140  K 4.3 4.2 2.9* 4.0 4.0 3.5  CL 104  --  104 101 103 104  CO2 25  --  18* 21* 24 26  GLUCOSE 164*  --  297* 152* 122* 108*  BUN 22  --  18 25* 33* 33*  CREATININE 1.73*  --  1.26* 1.81* 2.52* 2.05*   CALCIUM 8.3*  --  5.1* 6.8* 8.2* 8.4*  MG  --  1.9 1.3*  1.2*  --  1.8 2.0  PHOS  --   --  2.4*  --  4.6  --    Liver Function Tests: Recent Labs  Lab 04/26/18 0030 04/28/18 0313 04/29/18 0302  AST 24 74* 66*  ALT '16 28 30  ' ALKPHOS 79 68 58  BILITOT 0.7 0.6 0.6  PROT 7.5 5.8* 5.5*  ALBUMIN 4.2 3.0* 2.6*   CBC: Recent Labs  Lab 04/26/18 0030 04/26/18 0447 04/27/18 0948 04/28/18 0313 04/29/18 0302  WBC 13.2* 14.8* 20.9* 29.6* 17.5*  NEUTROABS 7.7  --   --   --  15.3*  HGB 13.0 10.9* 9.4* 10.6* 9.3*  HCT 40.5 35.4* 30.0* 33.1* 28.6*  MCV 91.4 93.2 94.3 93.0 89.4  PLT 237 191 142* 160 146*   Cardiac Enzymes: Recent Labs  Lab 04/26/18 0030 04/26/18 0447 04/27/18 1125 04/27/18 1732 04/28/18 0313  TROPONINI 0.03* 0.16* 0.43* 0.57* 0.44*   BNP (last 3 results) Recent Labs    04/26/18 0030 04/27/18 1054 04/29/18 0302  BNP 717.0* 799.0* 889.0*    CBG: Recent Labs  Lab 04/28/18 0805 04/28/18 1229 04/28/18 1747 04/28/18 2319 04/29/18 0543  GLUCAP 97 96  100* 78 100*    Recent Results (from the past 240 hour(s))  Blood culture (routine x 2)     Status: None (Preliminary result)   Collection Time: 04/26/18  1:54 AM  Result Value Ref Range Status   Specimen Description BLOOD LEFT FATTY CASTS  Final   Special Requests   Final    BOTTLES DRAWN AEROBIC AND ANAEROBIC Blood Culture adequate volume   Culture   Final    NO GROWTH 3 DAYS Performed at [facility], [address], [place], [place] [address]    Report Status PENDING  Incomplete  Blood culture (routine x 2)     Status: None (Preliminary result)   Collection Time: 04/26/18  1:55 AM  Result Value Ref Range Status   Specimen Description BLOOD LEFT ASSIST CONTROL  Final   Special Requests   Final    BOTTLES DRAWN AEROBIC AND ANAEROBIC Blood Culture results may not be optimal due to an excessive volume of blood received in culture bottles   Culture   Final    NO GROWTH 3 DAYS Performed  at [facility], [address] [place], [place] [address]    Report Status PENDING  Incomplete  MRSA PCR Screening     Status: None   Collection Time: 04/26/18  2:54 AM  Result Value Ref Range Status   MRSA by PCR NEGATIVE NEGATIVE Final    Comment:        The GeneXpert MRSA Assay (FDA approved for NASAL specimens only), is one component of a comprehensive MRSA colonization surveillance program. It is not intended to diagnose MRSA infection nor to guide or monitor treatment for MRSA infections. Performed at [facility], [address] [place], [place] [address]   Culture, respiratory (non-expectorated)     Status: None   Collection Time: 04/27/18 10:06 AM  Result Value Ref Range Status   Specimen Description   Final    TRACHEAL ASPIRATE Performed at [facility], [address], [place], [place] [address]    Special Requests   Final    NONE Performed at [facility], [address] [place], [place] [address]    Gram Stain   Final    FEW WBC PRESENT, PREDOMINANTLY PMN RARE GRAM POSITIVE RODS    Culture   Final    FEW Consistent with normal respiratory flora. Performed at [facility], [place] [address], [place], [place] [address]    Report Status 04/29/2018 FINAL  Final  CULTURE, BLOOD (ROUTINE X 2) w Reflex to ID Panel     Status: None (Preliminary result)   Collection Time: 04/28/18 11:52 AM  Result Value Ref Range Status   Specimen Description BLOOD LEFT HAND  Final   Special Requests   Final    BOTTLES DRAWN AEROBIC AND ANAEROBIC Blood Culture results may not be optimal due to an inadequate volume of blood received in culture bottles   Culture   Final    NO GROWTH < 24 HOURS Performed at [facility], [address] [place],  [address]    Report Status PENDING  Incomplete  Culture, blood (Routine X 2) w Reflex to ID Panel     Status: None (Preliminary result)   Collection Time: 04/28/18  1:47 PM   Result Value Ref Range Status   Specimen Description BLOOD BLOOD LEFT HAND  Final   Special Requests   Final    BOTTLES DRAWN AEROBIC AND ANAEROBIC Blood Culture adequate  volume   Culture   Final    NO GROWTH < 24 HOURS Performed at [facility], [address] [place], [place] [address]    Report Status PENDING  Incomplete     Studies: Dg Abd 1 View  Result Date: 04/27/2018 CLINICAL DATA:  Abdominal distension EXAM: ABDOMEN - 1 VIEW COMPARISON:  CT abdomen and pelvis May 16, 2015 FINDINGS: Nasogastric tube tip and side port are in the stomach. There is moderate stool in the colon. There is no appreciable bowel dilatation or air-fluid level to suggest bowel obstruction. No free air. There are multiple surgical clips in the pelvis. Visualized lung bases are clear. IMPRESSION: Nasogastric tube tip and side port in stomach. No bowel obstruction or free air evident. Moderate stool in colon. Multiple surgical clips in pelvis. Electronically Signed   By: Lowella Grip III M.D.   On: 04/27/2018 20:11   Ct Head Wo Contrast  Result Date: 04/28/2018 CLINICAL DATA:  Altered mental status EXAM: CT HEAD WITHOUT CONTRAST TECHNIQUE: Contiguous axial images were obtained from the base of the skull through the vertex without intravenous contrast. COMPARISON:  None. FINDINGS: Brain: No acute intracranial abnormality. Specifically, no hemorrhage, hydrocephalus, mass lesion, acute infarction, or significant intracranial injury. Vascular: No hyperdense vessel or unexpected calcification. Skull: No acute calvarial abnormality. Sinuses/Orbits: Visualized paranasal sinuses and mastoids clear. Orbital soft tissues unremarkable. Other: None IMPRESSION: No intracranial abnormality. Electronically Signed   By: Rolm Baptise M.D.   On: 04/28/2018 12:18   US Renal  Result Date: 04/29/2018 CLINICAL DATA:  Acute renal failure EXAM: RENAL / URINARY TRACT ULTRASOUND COMPLETE COMPARISON:  Abdominal CT  05/16/2015 FINDINGS: Right Kidney: Renal measurements: 10 x 4 x 4 cm = volume: 87 mL. Simple cyst in the lower pole measuring 21 mm. No hydronephrosis or solid mass. Left Kidney: Renal measurements: 10 x 4 x 4 cm = volume: 86 mL. Echogenicity within normal limits. No mass or hydronephrosis visualized. Bladder: Appears normal for degree of bladder distention. Incidental lobulated but benign appearing cysts in the right liver, known from abdominal CT in 2016. Small right pleural effusion. IMPRESSION: 1. No acute finding or hydronephrosis. 2. Symmetric renal size with 80-90 cc volume. Electronically Signed   By: Monte Fantasia M.D.   On: 04/29/2018 10:25   Dg Chest Port 1 View  Result Date: 04/29/2018 CLINICAL DATA:  Acute respiratory failure, shortness of breath, history of CHF, former smoker. EXAM: PORTABLE CHEST 1 VIEW COMPARISON:  Portable chest x-ray of April 28, 2018 FINDINGS: The endotracheal tube tip projects 3.1 cm above the carina. The lungs are reasonably well inflated. Patchy airspace opacities are present in the right mid and lower lung. The right hemidiaphragm is largely obscured and there is partial obscuration of the left hemidiaphragm. The esophagogastric tube tip in proximal port project below the GE junction. The cardiac silhouette is mildly enlarged but stable. The pulmonary vascularity is prominent centrally but there is no cephalization. The right-sided PICC line tip projects at the midportion of the SVC. The ICD is in stable position. IMPRESSION: Slightly improved appearance of the lungs with slightly decreased bibasilar densities. Persistent small bilateral pleural effusions and bibasilar atelectasis or pneumonia. Stable mild cardiac enlargement without pulmonary edema. The support tubes are in reasonable position. Electronically Signed   By: David  Martinique M.D.   On: 04/29/2018 08:21   Dg Chest Port 1 View  Result Date: 04/28/2018 CLINICAL DATA:  Respiratory failure EXAM: PORTABLE  CHEST 1 VIEW COMPARISON:  04/27/2018  FINDINGS: Endotracheal tube remains in good position. NG tube enters the stomach. AICD in good position. Right arm PICC tip in the SVC unchanged. Bibasilar airspace disease and bilateral effusions unchanged. Pulmonary vascular congestion unchanged. IMPRESSION: Bibasilar airspace disease and bilateral effusions unchanged. Support lines remain in good position. Electronically Signed   By: Franchot Gallo M.D.   On: 04/28/2018 07:01    Scheduled Meds: . aspirin  81 mg Per Tube Daily  . atorvastatin  40 mg Per Tube Daily  . chlorhexidine gluconate (MEDLINE KIT)  15 mL Mouth Rinse BID  . clopidogrel  75 mg Per Tube Daily  . feeding supplement (PRO-STAT SUGAR FREE 64)  30 mL Per Tube TID  . ipratropium  0.5 mg Nebulization Q6H  . lipase/protease/amylase  12,000 Units Oral Q8H  . mouth rinse  15 mL Mouth Rinse 10 times per day  . methylPREDNISolone (SOLU-MEDROL) injection  40 mg Intravenous BID  . metoprolol tartrate  5 mg Intravenous Q6H  . pantoprazole (PROTONIX) IV  40 mg Intravenous Daily  . sodium chloride flush  10-40 mL Intracatheter Q12H   Continuous Infusions: . amiodarone 60 mg/hr (04/29/18 1242)  . ampicillin-sulbactam (UNASYN) IV    . dexmedetomidine (PRECEDEX) IV infusion    . dextrose 5% lactated ringers 50 mL/hr at 04/29/18 1235  . feeding supplement (VITAL 1.5 CAL) Stopped (04/27/18 1233)  . heparin 1,200 Units/hr (04/29/18 1229)  . phenylephrine (NEO-SYNEPHRINE) Adult infusion Stopped (04/29/18 0846)  . vancomycin 1,000 mg (04/29/18 1215)    Assessment/Plan:  1. Septic shock secondary to pneumonia.  Antibiotics were switched to Unasyn and vancomycin. 2. Acute hypoxic respiratory failure with respiratory acidosis.  Status post extubation.  Continue nasal cannula oxygen supplementation.  Watch respiratory status closely. 3. Atrial fibrillation with rapid ventricular response.  Echocardiogram showed an EF of 40 to 45%.  Patient on amiodarone  drip.  Heparin drip started.  Metoprolol started. 4. Acute encephalopathy.  Need to watch closely in the ICU at this point. 5. Elevated troponin secondary to demand ischemia with septic shock.  6. Acute kidney injury on chronic kidney disease stage III.  Creatinine slightly better than yesterday. 7. Pancreatic insufficiency on pancreatic enzymes 8. Anemia of chronic disease 9. Hyperlipidemia unspecified on Lipitor 10. History of prostate cancer 11. History of chronic combined systolic diastolic congestive heart failure.  Code Status:     Code Status Orders  (From admission, onward)         Start     Ordered   04/26/18 0254  Full code  Continuous     04/26/18 0253        Code Status History    This patient has a current code status but no historical code status.     Family Communication: Spoke with family at bedside today Disposition Plan: To be determined  Consultants:  Critical care specialist  Antibiotics: -Unasyn -Vancomycin  Time spent: 26 minutes.  Case discussed with critical care specialist and nursing staff.  Jashanti Clinkscale [employer]

## 2018-04-29 NOTE — Progress Notes (Signed)
MEDICATION RELATED CONSULT NOTE - FOLLOW UP   Pharmacy Consult for Amiodarone DDIs Indication: A-fib  No Known Allergies  Patient Measurements: Height: 5\' 11"  (180.3 cm) Weight: 183 lb 13.8 oz (83.4 kg) IBW/kg (Calculated) : 75.3   Vital Signs: Temp: 99.5 F (37.5 C) (11/13 1300) Temp Source: Bladder (11/13 0800) BP: 121/80 (11/13 1300) Pulse Rate: 128 (11/13 1300) Intake/Output from previous day: 11/12 0701 - 11/13 0700 In: 3005.7 [I.V.:2752.3; IV Piggyback:253.3] Out: 1885 [Urine:1735; Emesis/NG output:150] Intake/Output from this shift: Total I/O In: 560.4 [I.V.:378; IV Piggyback:182.4] Out: 500 [Urine:500]  Labs: Recent Labs    04/27/18 0948  04/27/18 1125 04/27/18 1732 04/28/18 0313 04/29/18 0302 04/29/18 1000  WBC 20.9*  --   --   --  29.6* 17.5*  --   HGB 9.4*  --   --   --  10.6* 9.3*  --   HCT 30.0*  --   --   --  33.1* 28.6*  --   PLT 142*  --   --   --  160 146*  --   CREATININE  --    < > 1.26* 1.81* 2.52* 2.05*  --   LABCREA  --   --   --   --   --   --  34  MG  --   --  1.3*  1.2*  --  1.8 2.0  --   PHOS  --   --  2.4*  --  4.6  --   --   ALBUMIN  --   --   --   --  3.0* 2.6*  --   PROT  --   --   --   --  5.8* 5.5*  --   AST  --   --   --   --  74* 66*  --   ALT  --   --   --   --  28 30  --   ALKPHOS  --   --   --   --  68 58  --   BILITOT  --   --   --   --  0.6 0.6  --    < > = values in this interval not displayed.   Estimated Creatinine Clearance: 32.7 mL/min (A) (by C-G formula based on SCr of 2.05 mg/dL (H)).   Microbiology: Recent Results (from the past 720 hour(s))  Blood culture (routine x 2)     Status: None (Preliminary result)   Collection Time: 04/26/18  1:54 AM  Result Value Ref Range Status   Specimen Description BLOOD LEFT FATTY CASTS  Final   Special Requests   Final    BOTTLES DRAWN AEROBIC AND ANAEROBIC Blood Culture adequate volume   Culture   Final    NO GROWTH 3 DAYS Performed at [facility], [address] [place], [place] [address]    Report Status PENDING  Incomplete  Blood culture (routine x 2)     Status: None (Preliminary result)   Collection Time: 04/26/18  1:55 AM  Result Value Ref Range Status   Specimen Description BLOOD LEFT ASSIST CONTROL  Final   Special Requests   Final    BOTTLES DRAWN AEROBIC AND ANAEROBIC Blood Culture results may not be optimal due to an excessive volume of blood received in culture bottles   Culture   Final    NO GROWTH 3 DAYS Performed at [facility], [address], [place], [place] [address]  Report Status PENDING  Incomplete  MRSA PCR Screening     Status: None   Collection Time: 04/26/18  2:54 AM  Result Value Ref Range Status   MRSA by PCR NEGATIVE NEGATIVE Final    Comment:        The GeneXpert MRSA Assay (FDA approved for NASAL specimens only), is one component of a comprehensive MRSA colonization surveillance program. It is not intended to diagnose MRSA infection nor to guide or monitor treatment for MRSA infections. Performed at [facility], [address] [place], [place] [address]   Culture, respiratory (non-expectorated)     Status: None   Collection Time: 04/27/18 10:06 AM  Result Value Ref Range Status   Specimen Description   Final    TRACHEAL ASPIRATE Performed at [facility], [address], [place], [place] [address]    Special Requests   Final    NONE Performed at [facility], [address] [place], [place] [address]    Gram Stain   Final    FEW WBC PRESENT, PREDOMINANTLY PMN RARE GRAM POSITIVE RODS    Culture   Final    FEW Consistent with normal respiratory flora. Performed at [facility], [place] [address], [place], [place] [address]    Report Status 04/29/2018 FINAL  Final  CULTURE, BLOOD (ROUTINE X 2) w Reflex to ID Panel     Status: None (Preliminary result)   Collection Time: 04/28/18 11:52 AM  Result Value Ref Range Status   Specimen  Description BLOOD LEFT HAND  Final   Special Requests   Final    BOTTLES DRAWN AEROBIC AND ANAEROBIC Blood Culture results may not be optimal due to an inadequate volume of blood received in culture bottles   Culture   Final    NO GROWTH < 24 HOURS Performed at [facility], [address] [place], [place] [address]    Report Status PENDING  Incomplete  Culture, blood (Routine X 2) w Reflex to ID Panel     Status: None (Preliminary result)   Collection Time: 04/28/18  1:47 PM  Result Value Ref Range Status   Specimen Description BLOOD BLOOD LEFT HAND  Final   Special Requests   Final    BOTTLES DRAWN AEROBIC AND ANAEROBIC Blood Culture adequate volume   Culture   Final    NO GROWTH < 24 HOURS Performed at [facility], [address] [place],  [address]    Report Status PENDING  Incomplete    Assessment/Plan:  QT interval 440 on ECG 11/10. Metoprolol tartrate, methylprednisolone, ipratropium nebulizer solution, heparin continuous infusion, were initiated on 11/13. No interaction identified for new orders.    Continue current medications. Pharmacy will continue to monitor.   Thank you for allowing pharmacy to be a part of this patient's care   Suzzanne Cloud 04/29/2018,1:42 PM

## 2018-04-29 NOTE — Progress Notes (Signed)
PULMONARY / CRITICAL CARE MEDICINE   NAME:  Matthew Zamora, MRN:  [id number], DOB:  [birth year], LOS: 3 ADMISSION DATE:  04/26/2018   PT PROFILE:    70 M adm via ED with acute AMS/delirium, hypoxemia, tachypnea, labile BP, leukocytosis, suspected pneumonia.  Developed AFRVR on day following admission.  04/29/2018: -No major events overnight -Creatinine improved with diuresis still 6 L positive -Head CT was done yesterday did not reveal any acute abnormality -BNP is 889, started on Vanco plus Unasyn yesterday due to low-grade fever and high WBC, repeat cultures are negative so far WBC improved to 17,000 - We will plan sedation vacation and SBT today morning  SIGNIFICANT STUDIES/EVENTS:  11/10 Echcoardiogram: LVEF 40-45%.  Mild MR.  LA moderately dilated.  RVSP estimate 50 mmHg 11/11 Developed AFRVR. NE changed to PE. Amiodarone infusion intitiated.  11/12 Severe intermittent agitation. Propofol initiated, ABG improved with vent adjustment, head CT done negative 11/13: Doing okay, last night went into A. fib with slightly higher rate in 100s, done for sedation vacation  CULTURES:  MRSA PCR 11/10 >> NEG Resp 11/11 >> G PR Blood 11/10 >>  Blood: 04/28/2018 Urine strep Ag 11/11 >>  Urine Legionella Ag 11/11 >>   ANTIBIOTICS:  Azithromycin 11/10 >> stopped at 04/28/2018 Ceftriaxone 11/10 >> stopped at 04/28/2018 Vancomycin: 04/28/2018 Unasyn: 04/28/2018    LINES/TUBES:  ETT 11/10 >>  RUE PICC 11/10 >>   CONSULTANTS:  Nephrology   SUBJECTIVE:  Intermittent agitation.  When calm, synchronous with mechanical ventilation.  Not F/C.  CONSTITUTIONAL: BP (!) 101/57   Pulse 99   Temp 98.1 F (36.7 C)   Resp (!) 7   Ht 5\' 11"  (1.803 m)   Wt 83.4 kg   SpO2 100%   BMI 25.64 kg/m   I/O last 3 completed shifts: In: 4369.9 [I.V.:4016.7; IV Piggyback:353.2] Out: 2060 [Urine:1910; Emesis/NG output:150]     Vent Mode: PRVC FiO2 (%):  [35 %] 35 % Set Rate:  [20 bmp] 20  bmp Vt Set:  [450 mL] 450 mL PEEP:  [5 cmH20] 5 cmH20 Plateau Pressure:  [18 HCW23-76 cmH20] 24 cmH20  PHYSICAL EXAM: General: Intubated, sedated, not F/C Neuro: CNs intact, moves all extremities HEENT: NCAT, sclerae white Cardiovascular: IRIR, tachy, no M noted Lungs: Scattered bilateral rhonchi, no wheezes Abdomen: soft, NT, + BS Ext: warm, no edema Skin: No lesions noted  BMP Latest Ref Rng & Units 04/29/2018 04/28/2018 04/27/2018  Glucose 70 - 99 mg/dL 108(H) 122(H) 152(H)  BUN 8 - 23 mg/dL 33(H) 33(H) 25(H)  Creatinine 0.61 - 1.24 mg/dL 2.05(H) 2.52(H) 1.81(H)  Sodium 135 - 145 mmol/L 140 137 133(L)  Potassium 3.5 - 5.1 mmol/L 3.5 4.0 4.0  Chloride 98 - 111 mmol/L 104 103 101  CO2 22 - 32 mmol/L 26 24 21(L)  Calcium 8.9 - 10.3 mg/dL 8.4(L) 8.2(L) 6.8(L)   CBC Latest Ref Rng & Units 04/29/2018 04/28/2018 04/27/2018  WBC 4.0 - 10.5 K/uL 17.5(H) 29.6(H) 20.9(H)  Hemoglobin 13.0 - 17.0 g/dL 9.3(L) 10.6(L) 9.4(L)  Hematocrit 39.0 - 52.0 % 28.6(L) 33.1(L) 30.0(L)  Platelets 150 - 400 K/uL 146(L) 160 142(L)    Cardiac Panel (last 3 results) Recent Labs    04/27/18 1125 04/27/18 1732 04/28/18 0313  TROPONINI 0.43* 0.57* 0.44*   BNP    Component Value Date/Time   BNP 889.0 (H) 04/29/2018 0302    LABS  Glucose Recent Labs  Lab 04/27/18 0739 04/28/18 0805 04/28/18 1229 04/28/18 1747 04/28/18 2319 04/29/18 0543  GLUCAP 113* 97 96 100* 78 100*    BMET Recent Labs  Lab 04/27/18 1732 04/28/18 0313 04/29/18 0302  NA 133* 137 140  K 4.0 4.0 3.5  CL 101 103 104  CO2 21* 24 26  BUN 25* 33* 33*  CREATININE 1.81* 2.52* 2.05*  GLUCOSE 152* 122* 108*    Liver Enzymes Recent Labs  Lab 04/26/18 0030 04/28/18 0313 04/29/18 0302  AST 24 74* 66*  ALT 16 28 30   ALKPHOS 79 68 58  BILITOT 0.7 0.6 0.6  ALBUMIN 4.2 3.0* 2.6*    Electrolytes Recent Labs  Lab 04/27/18 1125 04/27/18 1732 04/28/18 0313 04/29/18 0302  CALCIUM 5.1* 6.8* 8.2* 8.4*  MG 1.3*   1.2*  --  1.8 2.0  PHOS 2.4*  --  4.6  --     CBC Recent Labs  Lab 04/27/18 0948 04/28/18 0313 04/29/18 0302  WBC 20.9* 29.6* 17.5*  HGB 9.4* 10.6* 9.3*  HCT 30.0* 33.1* 28.6*  PLT 142* 160 146*    ABG Recent Labs  Lab 04/28/18 0500 04/28/18 0830 04/29/18 0338  PHART 7.19* 7.27* 7.40  PCO2ART 60* 57* 41  PO2ART 92 80* 113*    Coag's Recent Labs  Lab 04/26/18 0030 04/29/18 0302  APTT 29  --   INR 0.99 1.12    Sepsis Markers Recent Labs  Lab 04/26/18 0030 04/26/18 0154 04/26/18 0447  LATICACIDVEN  --  1.4 1.6  PROCALCITON <0.10  --   --     Cardiac Enzymes Recent Labs  Lab 04/27/18 1125 04/27/18 1732 04/28/18 0313  TROPONINI 0.43* 0.57* 0.44*    ASSESSMENT AND PLAN   Acute ventilator dependent respiratory failure with hypoxemia Suspected community-acquired pneumonia Suspected pulmonary edema Hypotension Paroxysmal atrial fibrillation AKI, nonoliguric Severe sepsis, septic shock Acute encephalopathy with intermittent agitation  PLAN:  EPP:[id number] changed to phenylephrine 11/11-taper as tolerated Amiodarone infusion initiated 11/11 for A. fib Give large dose of Lasix-Lasix 60 ordered today EF is 40 to 45%, history of AICD by Dr. Luana Shu in the past, mild leak of troponin will continue with aspirin will consider cardiology consult spoke with Dr. Everlene Balls who will evaluate the patient today [id number] full vent support - settings reviewed and/or adjusted- plan to do sedation vacation and SBT Cont vent bundle -- Aspiration precaution -- Vent protocol on vented patient -Continue with the bronchodilators avoid albuterol due to A. fib ID: Switch to Vanco plus Unasyn doing well, adjust with culture t ENDO: TSH of -- SSI monitor blood sugar GI: N.p.o. for weaning continue PPI RENAL: Renal consult is called, see response with large dose of Lasix -- Electrolyte replacement protocol, Monitor Cr and K  CNS: Currently sedated HEMATOLOGY: Monitor  Hb -- Monitor HB and PLT MUSCULOSKELETAL: No acute issues PAIN AND SEDATION: Currently on propofol and fentanyl as needed  Skin/Wound: Chronic changes  Electrolytes: Replace electrolytes per ICU electrolyte replacement protocol.   IVF: none  Nutrition: Tube feeds as tolerated  Prophylaxis: DVT Prophylaxis with heparin,. GI Prophylaxis.   Restraints: Soft limb as he is trying to pull the tube on the case regardless  PT/OT eval and treat. OOB when appropriate.   Lines/Tubes: 04/26/2018 Foley pick 04/26/2018 WOR central line. ADVANCE DIRECTIVE: Full code-palliative plan to speak with the family today FAMILY DISCUSSION: No known available to discuss Quality Care: PPI, DVT prophylaxis, HOB elevated, Infection control all reviewed and addressed. Events and notes from last 24 hours reviewed. Care plan discussed on multidisciplinary rounds CC TIME: 35 minutes  Old records reviewed discussed results and management plan with patient  Images personally reviewed and results and labs reviewed and discussed with patient.  All medication reviewed and adjusted  Further management depending on test results and work up as outlined above.   Lahoma Rocker, M.D

## 2018-04-29 NOTE — Progress Notes (Addendum)
Patient is extubated and doing well  Afib still hard to control Will add metoprolol Will increase amiodarone dose Will start AC with heparin Will ask cardiology to follow up Patient complains of some sob but RR is ok Sats ok No wheeze on exam but prolonged inspiration Will give solumedrol for that and monitor closely  Matthew Zamora [facility] Pulmonary Critical Care & Sleep Medicine

## 2018-04-30 ENCOUNTER — Inpatient Hospital Stay: Payer: Medicare Other

## 2018-04-30 DIAGNOSIS — R4182 Altered mental status, unspecified: Secondary | ICD-10-CM

## 2018-04-30 DIAGNOSIS — Z515 Encounter for palliative care: Secondary | ICD-10-CM

## 2018-04-30 DIAGNOSIS — R0602 Shortness of breath: Secondary | ICD-10-CM

## 2018-04-30 DIAGNOSIS — Z7189 Other specified counseling: Secondary | ICD-10-CM

## 2018-04-30 LAB — PROTEIN ELECTRO, RANDOM URINE
ALBUMIN ELP UR: 26.1 %
Alpha-1-Globulin, U: 9.5 %
Alpha-2-Globulin, U: 13.8 %
Beta Globulin, U: 29 %
GAMMA GLOBULIN, U: 21.6 %
Total Protein, Urine: 6.7 mg/dL

## 2018-04-30 LAB — CBC WITH DIFFERENTIAL/PLATELET
Abs Immature Granulocytes: 0.08 10*3/uL — ABNORMAL HIGH (ref 0.00–0.07)
BASOS ABS: 0 10*3/uL (ref 0.0–0.1)
Basophils Relative: 0 %
EOS PCT: 0 %
Eosinophils Absolute: 0 10*3/uL (ref 0.0–0.5)
HEMATOCRIT: 26.3 % — AB (ref 39.0–52.0)
Hemoglobin: 8.5 g/dL — ABNORMAL LOW (ref 13.0–17.0)
IMMATURE GRANULOCYTES: 1 %
LYMPHS ABS: 0.3 10*3/uL — AB (ref 0.7–4.0)
LYMPHS PCT: 3 %
MCH: 28.6 pg (ref 26.0–34.0)
MCHC: 32.3 g/dL (ref 30.0–36.0)
MCV: 88.6 fL (ref 80.0–100.0)
Monocytes Absolute: 0.3 10*3/uL (ref 0.1–1.0)
Monocytes Relative: 3 %
NRBC: 0 % (ref 0.0–0.2)
Neutro Abs: 9 10*3/uL — ABNORMAL HIGH (ref 1.7–7.7)
Neutrophils Relative %: 93 %
Platelets: 126 10*3/uL — ABNORMAL LOW (ref 150–400)
RBC: 2.97 MIL/uL — ABNORMAL LOW (ref 4.22–5.81)
RDW: 14.6 % (ref 11.5–15.5)
WBC: 9.7 10*3/uL (ref 4.0–10.5)

## 2018-04-30 LAB — PROTEIN ELECTROPHORESIS, SERUM
A/G RATIO SPE: 0.9 (ref 0.7–1.7)
Albumin ELP: 2.3 g/dL — ABNORMAL LOW (ref 2.9–4.4)
Alpha-1-Globulin: 0.5 g/dL — ABNORMAL HIGH (ref 0.0–0.4)
Alpha-2-Globulin: 0.8 g/dL (ref 0.4–1.0)
Beta Globulin: 0.6 g/dL — ABNORMAL LOW (ref 0.7–1.3)
GLOBULIN, TOTAL: 2.5 g/dL (ref 2.2–3.9)
Gamma Globulin: 0.5 g/dL (ref 0.4–1.8)
TOTAL PROTEIN ELP: 4.8 g/dL — AB (ref 6.0–8.5)

## 2018-04-30 LAB — BLOOD GAS, ARTERIAL
Acid-Base Excess: 0.8 mmol/L (ref 0.0–2.0)
Acid-base deficit: 1 mmol/L (ref 0.0–2.0)
BICARBONATE: 27.7 mmol/L (ref 20.0–28.0)
Bicarbonate: 27.4 mmol/L (ref 20.0–28.0)
FIO2: 0.35
FIO2: 0.4
MECHVT: 450 mL
O2 Saturation: 86.7 %
O2 Saturation: 98 %
PATIENT TEMPERATURE: 37
PEEP: 5 cmH2O
PH ART: 7.24 — AB (ref 7.350–7.450)
PH ART: 7.31 — AB (ref 7.350–7.450)
Patient temperature: 37
pCO2 arterial: 64 mmHg — ABNORMAL HIGH (ref 32.0–48.0)
pCO2 arterial: 55 mmHg — ABNORMAL HIGH (ref 32.0–48.0)
pO2, Arterial: 62 mmHg — ABNORMAL LOW (ref 83.0–108.0)
pO2, Arterial: 113 mmHg — ABNORMAL HIGH (ref 83.0–108.0)

## 2018-04-30 LAB — GLUCOSE, CAPILLARY
GLUCOSE-CAPILLARY: 133 mg/dL — AB (ref 70–99)
Glucose-Capillary: 146 mg/dL — ABNORMAL HIGH (ref 70–99)
Glucose-Capillary: 120 mg/dL — ABNORMAL HIGH (ref 70–99)
Glucose-Capillary: 146 mg/dL — ABNORMAL HIGH (ref 70–99)

## 2018-04-30 LAB — COMPREHENSIVE METABOLIC PANEL
ALBUMIN: 2.7 g/dL — AB (ref 3.5–5.0)
ALT: 35 U/L (ref 0–44)
AST: 52 U/L — AB (ref 15–41)
Alkaline Phosphatase: 59 U/L (ref 38–126)
Anion gap: 11 (ref 5–15)
BILIRUBIN TOTAL: 0.6 mg/dL (ref 0.3–1.2)
BUN: 33 mg/dL — AB (ref 8–23)
CO2: 26 mmol/L (ref 22–32)
Calcium: 8.5 mg/dL — ABNORMAL LOW (ref 8.9–10.3)
Chloride: 106 mmol/L (ref 98–111)
Creatinine, Ser: 1.52 mg/dL — ABNORMAL HIGH (ref 0.61–1.24)
GFR calc Af Amer: 50 mL/min — ABNORMAL LOW (ref >60)
GFR calc non Af Amer: 43 mL/min — ABNORMAL LOW (ref >60)
GLUCOSE: 164 mg/dL — AB (ref 70–99)
POTASSIUM: 3.7 mmol/L (ref 3.5–5.1)
SODIUM: 143 mmol/L (ref 135–145)
Total Protein: 5.9 g/dL — ABNORMAL LOW (ref 6.5–8.1)

## 2018-04-30 LAB — MAGNESIUM: Magnesium: 2.1 mg/dL (ref 1.7–2.4)

## 2018-04-30 LAB — MPO/PR-3 (ANCA) ANTIBODIES

## 2018-04-30 LAB — C3 COMPLEMENT: C3 COMPLEMENT: 111 mg/dL (ref 82–167)

## 2018-04-30 LAB — C4 COMPLEMENT: Complement C4, Body Fluid: 36 mg/dL (ref 14–44)

## 2018-04-30 LAB — HEPARIN LEVEL (UNFRACTIONATED): Heparin Unfractionated: 0.48 IU/mL (ref 0.30–0.70)

## 2018-04-30 LAB — GLOMERULAR BASEMENT MEMBRANE ANTIBODIES: GBM AB: 2 U (ref 0–20)

## 2018-04-30 LAB — TRIGLYCERIDES: TRIGLYCERIDES: 130 mg/dL (ref <150)

## 2018-04-30 LAB — ANA W/REFLEX IF POSITIVE: Anti Nuclear Antibody(ANA): NEGATIVE

## 2018-04-30 MED ORDER — IPRATROPIUM BROMIDE 0.02 % IN SOLN
0.5000 mg | RESPIRATORY_TRACT | Status: DC
  Administered 2018-04-30 – 2018-05-02 (×10): 0.5 mg via RESPIRATORY_TRACT
  Filled 2018-04-30 (×8): qty 2.5

## 2018-04-30 MED ORDER — IPRATROPIUM-ALBUTEROL 0.5-2.5 (3) MG/3ML IN SOLN: 3.0000 mL | RESPIRATORY_TRACT | Status: DC | PRN

## 2018-04-30 MED ORDER — HALOPERIDOL LACTATE 5 MG/ML IJ SOLN
1.0000 mg | Freq: Four times a day (QID) | INTRAMUSCULAR | Status: DC | PRN
  Administered 2018-04-30 – 2018-05-05 (×2): 1 mg via INTRAVENOUS
  Filled 2018-04-30 (×3): qty 1

## 2018-04-30 MED ORDER — MIDAZOLAM HCL 2 MG/2ML IJ SOLN
INTRAMUSCULAR | Status: AC
  Administered 2018-04-30: 2 mg via INTRAVENOUS
  Filled 2018-04-30: qty 4

## 2018-04-30 MED ORDER — FENTANYL CITRATE (PF) 100 MCG/2ML IJ SOLN: 50.0000 ug | INTRAMUSCULAR | Status: DC | PRN

## 2018-04-30 MED ORDER — FENTANYL CITRATE (PF) 100 MCG/2ML IJ SOLN
12.5000 ug | Freq: Once | INTRAMUSCULAR | Status: AC
  Administered 2018-04-30: 12.5 ug via INTRAVENOUS
  Filled 2018-04-30: qty 2

## 2018-04-30 MED ORDER — IPRATROPIUM BROMIDE 0.02 % IN SOLN
RESPIRATORY_TRACT | Status: AC
  Administered 2018-04-30: 0.5 mg via RESPIRATORY_TRACT
  Filled 2018-04-30: qty 2.5

## 2018-04-30 MED ORDER — MIDAZOLAM HCL 2 MG/2ML IJ SOLN
1.0000 mg | INTRAMUSCULAR | Status: AC | PRN
  Administered 2018-05-01 (×3): 1 mg via INTRAVENOUS
  Filled 2018-04-30 (×3): qty 2

## 2018-04-30 MED ORDER — MIDAZOLAM HCL 2 MG/2ML IJ SOLN
1.0000 mg | INTRAMUSCULAR | Status: DC | PRN
  Administered 2018-05-01 – 2018-05-04 (×5): 1 mg via INTRAVENOUS
  Filled 2018-04-30 (×6): qty 2

## 2018-04-30 MED ORDER — PROPOFOL 1000 MG/100ML IV EMUL
INTRAVENOUS | Status: AC
  Administered 2018-04-30: 40 ug/kg/min via INTRAVENOUS
  Filled 2018-04-30: qty 100

## 2018-04-30 MED ORDER — FUROSEMIDE 10 MG/ML IJ SOLN
40.0000 mg | Freq: Once | INTRAMUSCULAR | Status: AC
  Administered 2018-04-30: 40 mg via INTRAVENOUS
  Filled 2018-04-30: qty 4

## 2018-04-30 MED ORDER — MIDAZOLAM HCL 2 MG/2ML IJ SOLN
4.0000 mg | Freq: Once | INTRAMUSCULAR | Status: AC
  Administered 2018-04-30: 2 mg via INTRAVENOUS

## 2018-04-30 MED ORDER — FENTANYL CITRATE (PF) 100 MCG/2ML IJ SOLN
50.0000 ug | INTRAMUSCULAR | Status: DC | PRN
  Administered 2018-05-02: 50 ug via INTRAVENOUS
  Filled 2018-04-30 (×2): qty 2

## 2018-04-30 MED ORDER — CHLORHEXIDINE GLUCONATE 0.12 % MT SOLN
15.0000 mL | Freq: Two times a day (BID) | OROMUCOSAL | Status: DC
  Administered 2018-04-30 (×2): 15 mL via OROMUCOSAL
  Filled 2018-04-30 (×2): qty 15

## 2018-04-30 MED ORDER — METHYLPREDNISOLONE SODIUM SUCC 40 MG IJ SOLR
40.0000 mg | Freq: Two times a day (BID) | INTRAMUSCULAR | Status: DC
  Administered 2018-04-30 – 2018-05-04 (×8): 40 mg via INTRAVENOUS
  Filled 2018-04-30 (×7): qty 1

## 2018-04-30 MED ORDER — LORAZEPAM 2 MG/ML IJ SOLN
2.0000 mg | Freq: Once | INTRAMUSCULAR | Status: AC
  Administered 2018-04-30: 2 mg via INTRAVENOUS
  Filled 2018-04-30: qty 1

## 2018-04-30 MED ORDER — SUCCINYLCHOLINE CHLORIDE 20 MG/ML IJ SOLN
50.0000 mg | Freq: Once | INTRAMUSCULAR | Status: AC
  Administered 2018-04-30: 50 mg via INTRAVENOUS

## 2018-04-30 MED ORDER — METHYLPREDNISOLONE SODIUM SUCC 125 MG IJ SOLR
INTRAMUSCULAR | Status: AC
  Filled 2018-04-30: qty 2

## 2018-04-30 MED ORDER — SUCCINYLCHOLINE CHLORIDE 20 MG/ML IJ SOLN
INTRAMUSCULAR | Status: AC
  Administered 2018-04-30: 50 mg via INTRAVENOUS
  Filled 2018-04-30: qty 1

## 2018-04-30 MED ORDER — FAMOTIDINE IN NACL 20-0.9 MG/50ML-% IV SOLN
20.0000 mg | Freq: Two times a day (BID) | INTRAVENOUS | Status: DC
  Administered 2018-05-01 (×2): 20 mg via INTRAVENOUS
  Filled 2018-04-30 (×2): qty 50

## 2018-04-30 MED ORDER — INSULIN ASPART 100 UNIT/ML ~~LOC~~ SOLN
0.0000 [IU] | Freq: Four times a day (QID) | SUBCUTANEOUS | Status: DC
  Administered 2018-04-30: 1 [IU] via SUBCUTANEOUS
  Administered 2018-05-01: 2 [IU] via SUBCUTANEOUS
  Filled 2018-04-30 (×2): qty 1

## 2018-04-30 MED ORDER — PROPOFOL 1000 MG/100ML IV EMUL
5.0000 ug/kg/min | INTRAVENOUS | Status: DC
  Administered 2018-04-30 – 2018-05-01 (×2): 40 ug/kg/min via INTRAVENOUS
  Administered 2018-05-01 – 2018-05-03 (×13): 50 ug/kg/min via INTRAVENOUS
  Administered 2018-05-03: 30 ug/kg/min via INTRAVENOUS
  Administered 2018-05-04: 40 ug/kg/min via INTRAVENOUS
  Administered 2018-05-04: 50 ug/kg/min via INTRAVENOUS
  Filled 2018-04-30 (×19): qty 100

## 2018-04-30 MED ORDER — FUROSEMIDE 10 MG/ML IJ SOLN: 40.0000 mg | Freq: Once | INTRAMUSCULAR | Status: DC

## 2018-04-30 MED ORDER — ORAL CARE MOUTH RINSE
15.0000 mL | Freq: Two times a day (BID) | OROMUCOSAL | Status: DC
  Administered 2018-04-30 (×2): 15 mL via OROMUCOSAL

## 2018-04-30 MED ORDER — POTASSIUM CHLORIDE 10 MEQ/100ML IV SOLN
10.0000 meq | INTRAVENOUS | Status: AC
  Administered 2018-04-30 (×2): 10 meq via INTRAVENOUS
  Filled 2018-04-30 (×4): qty 100

## 2018-04-30 MED ORDER — FUROSEMIDE 10 MG/ML IJ SOLN
INTRAMUSCULAR | Status: AC
  Administered 2018-04-30: 40 mg
  Filled 2018-04-30: qty 4

## 2018-04-30 NOTE — Progress Notes (Signed)
[place] for heparin dosing for this 66 YOM admitted on 11/10 with acute AMS/delirium and suspected pneumonia. Developed AFRVR on 11/11 requiring anticoagulation.  Indication: Anticoagulation-Afib   No Known Allergies  Patient Measurements: Height: 5\' 11"  (180.3 cm) Weight: 189 lb 9.5 oz (86 kg) IBW/kg (Calculated) : 75.3 Heparin Dosing Weight: 83.4 kg (actual BW)  Vital Signs: Temp: 97.6 F (36.4 C) (11/14 1200) Temp Source: Axillary (11/14 1200) BP: 133/76 (11/14 1300) Pulse Rate: 65 (11/14 1300)  Labs: Recent Labs    04/27/18 1732  04/28/18 0313 04/29/18 0302 04/29/18 1725 04/29/18 2025 04/30/18 0423  HGB  --    < > 10.6* 9.3*  --   --  8.5*  HCT  --   --  33.1* 28.6*  --   --  26.3*  PLT  --   --  160 146*  --   --  126*  LABPROT  --   --   --  14.3  --   --   --   INR  --   --   --  1.12  --   --   --   HEPARINUNFRC  --   --   --   --   --  0.49 0.48  CREATININE 1.81*  --  2.52* 2.05* 1.70*  --  1.52*  TROPONINI 0.57*  --  0.44*  --   --   --   --    < > = values in this interval not displayed.    Estimated Creatinine Clearance: 44 mL/min (A) (by C-G formula based on SCr of 1.52 mg/dL (H)).   Medical History: Past Medical History:  Diagnosis Date  . Abdominal aortic aneurysm (AAA) ([place]) 05/13/15   seen on ct scan  . 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 Anemia   . 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 CAD (coronary artery disease)   . Cataract cortical, senile   . CHF (congestive heart failure) ([place])   . Chronic hoarseness   . Exocrine pancreatic insufficiency   . H. pylori infection   . History of hepatitis   . Hyperlipidemia   . Hypertension   . 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 Prostate CA [facility])     Assessment: Patient will require anticoagulation for stroke prevention related to newly developed A-fib. CHA2DS2-VASc is at least 4 (age >27, HTN, CHF). Patient was given heparin 5000 Units SubQ  Q8h. Last dose given 11/13 @ 0538.   Goal of Therapy:  Heparin level goal = 0.3-0.7  Plan:  11/14 0423: heparin level 0.48. Continue current regimen - heparin 1200 Units/hr. Recheck heparin level and CBC with tomorrow AM labs.  Monitor heparin level and CBC/platelets daily.   Ocie Doyne, PharmD student  04/30/2018 1:43 PM

## 2018-04-30 NOTE — Progress Notes (Signed)
[facility]  ROUNDING NOTE   Subjective:  Patient extubated. Renal function improved. Creatinine down to 1.5.   Objective:  Vital signs in last 24 hours:  Temp:  [95.7 F (35.4 C)-99.5 F (37.5 C)] 96.3 F (35.7 C) (11/14 0630) Pulse Rate:  [58-150] 62 (11/14 0730) Resp:  [13-33] 15 (11/14 0730) BP: (79-165)/(48-90) 127/66 (11/14 0630) SpO2:  [93 %-100 %] 98 % (11/14 0730) Weight:  [86 kg] 86 kg (11/14 0500)  Weight change: 2.6 kg Filed Weights   04/28/18 0500 04/29/18 0305 04/30/18 0500  Weight: 86.1 kg 83.4 kg 86 kg    Intake/Output: I/O last 3 completed shifts: In: 7245.5 [I.V.:3136.7; NG/GT:2673.3; IV Piggyback:1435.4] Out: 2760 [Urine:2760]   Intake/Output this shift:  No intake/output data recorded.  Physical Exam: General: No acute distress  Head: Brent/AT OM moist  Eyes: Anicteric  Neck: Supple, trachea midline  Lungs:  Scattered rhonchi, normal effort  Heart: S1S2 no rubs  Abdomen:  Soft, nontender, bowel sounds present  Extremities: no peripheral edema.  Neurologic: Awake, slightly agitated  Skin: No lesions       Basic Metabolic Panel: Recent Labs  Lab 04/27/18 1125 04/27/18 1732 04/28/18 0313 04/29/18 0302 04/29/18 1725 04/30/18 0423  NA 133* 133* 137 140 142 143  K 2.9* 4.0 4.0 3.5 3.9 3.7  CL 104 101 103 104 104 106  CO2 18* 21* 24 26 25 26   GLUCOSE 297* 152* 122* 108* 132* 164*  BUN 18 25* 33* 33* 31* 33*  CREATININE 1.26* 1.81* 2.52* 2.05* 1.70* 1.52*  CALCIUM 5.1* 6.8* 8.2* 8.4* 8.6* 8.5*  MG 1.3*  1.2*  --  1.8 2.0 1.9 2.1  PHOS 2.4*  --  4.6  --   --   --     Liver Function Tests: Recent Labs  Lab 04/26/18 0030 04/28/18 0313 04/29/18 0302 04/30/18 0423  AST 24 74* 66* 52*  ALT 16 28 30  35  ALKPHOS 79 68 58 59  BILITOT 0.7 0.6 0.6 0.6  PROT 7.5 5.8* 5.5* 5.9*  ALBUMIN 4.2 3.0* 2.6* 2.7*   No results for input(s): LIPASE, AMYLASE in the last 168 hours. Recent Labs  Lab 04/29/18 0302  AMMONIA 21     CBC: Recent Labs  Lab 04/26/18 0030 04/26/18 0447 04/27/18 0948 04/28/18 0313 04/29/18 0302 04/30/18 0423  WBC 13.2* 14.8* 20.9* 29.6* 17.5* 9.7  NEUTROABS 7.7  --   --   --  15.3* 9.0*  HGB 13.0 10.9* 9.4* 10.6* 9.3* 8.5*  HCT 40.5 35.4* 30.0* 33.1* 28.6* 26.3*  MCV 91.4 93.2 94.3 93.0 89.4 88.6  PLT 237 191 142* 160 146* 126*    Cardiac Enzymes: Recent Labs  Lab 04/26/18 0030 04/26/18 0447 04/27/18 1125 04/27/18 1732 04/28/18 0313  TROPONINI 0.03* 0.16* 0.43* 0.57* 0.44*    BNP: Invalid input(s): POCBNP  CBG: Recent Labs  Lab 04/29/18 0543 04/29/18 1251 04/29/18 1746 04/29/18 2337 04/30/18 0553  GLUCAP 100* 135* 127* 144* 146*    Microbiology: Results for orders placed or performed during the hospital encounter of 04/26/18  Blood culture (routine x 2)     Status: None (Preliminary result)   Collection Time: 04/26/18  1:54 AM  Result Value Ref Range Status   Specimen Description BLOOD LEFT FATTY CASTS  Final   Special Requests   Final    BOTTLES DRAWN AEROBIC AND ANAEROBIC Blood Culture adequate volume   Culture   Final    NO GROWTH 4 DAYS Performed at [facility],  [place], [place] [address]    Report Status PENDING  Incomplete  Blood culture (routine x 2)     Status: None (Preliminary result)   Collection Time: 04/26/18  1:55 AM  Result Value Ref Range Status   Specimen Description BLOOD LEFT ASSIST CONTROL  Final   Special Requests   Final    BOTTLES DRAWN AEROBIC AND ANAEROBIC Blood Culture results may not be optimal due to an excessive volume of blood received in culture bottles   Culture   Final    NO GROWTH 4 DAYS Performed at [facility], [address], [place], [place] [address]    Report Status PENDING  Incomplete  MRSA PCR Screening     Status: None   Collection Time: 04/26/18  2:54 AM  Result Value Ref Range Status   MRSA by PCR NEGATIVE NEGATIVE Final    Comment:        The GeneXpert MRSA  Assay (FDA approved for NASAL specimens only), is one component of a comprehensive MRSA colonization surveillance program. It is not intended to diagnose MRSA infection nor to guide or monitor treatment for MRSA infections. Performed at [facility], [address] [place], [place] [address]   Culture, respiratory (non-expectorated)     Status: None   Collection Time: 04/27/18 10:06 AM  Result Value Ref Range Status   Specimen Description   Final    TRACHEAL ASPIRATE Performed at [facility], [address], [place], [place] [address]    Special Requests   Final    NONE Performed at [facility], [address] [place], [place] [address]    Gram Stain   Final    FEW WBC PRESENT, PREDOMINANTLY PMN RARE GRAM POSITIVE RODS    Culture   Final    FEW Consistent with normal respiratory flora. Performed at [facility], [place] [address], [place], [place] [address]    Report Status 04/29/2018 FINAL  Final  CULTURE, BLOOD (ROUTINE X 2) w Reflex to ID Panel     Status: None (Preliminary result)   Collection Time: 04/28/18 11:52 AM  Result Value Ref Range Status   Specimen Description BLOOD LEFT HAND  Final   Special Requests   Final    BOTTLES DRAWN AEROBIC AND ANAEROBIC Blood Culture results may not be optimal due to an inadequate volume of blood received in culture bottles   Culture   Final    NO GROWTH 2 DAYS Performed at [facility], [address], [place], [place] [address]    Report Status PENDING  Incomplete  Culture, blood (Routine X 2) w Reflex to ID Panel     Status: None (Preliminary result)   Collection Time: 04/28/18  1:47 PM  Result Value Ref Range Status   Specimen Description BLOOD BLOOD LEFT HAND  Final   Special Requests   Final    BOTTLES DRAWN AEROBIC AND ANAEROBIC Blood Culture adequate volume   Culture   Final    NO GROWTH 2 DAYS Performed at [facility], [address] [place], [place]  [address]    Report Status PENDING  Incomplete    Coagulation Studies: Recent Labs    04/29/18 0302  LABPROT 14.3  INR 1.12    Urinalysis: No results for input(s): COLORURINE, LABSPEC, PHURINE, GLUCOSEU, HGBUR, BILIRUBINUR, KETONESUR, PROTEINUR, UROBILINOGEN, NITRITE, LEUKOCYTESUR in the last 72 hours.  Invalid input(s): APPERANCEUR    Imaging: Ct Head Wo Contrast  Result Date: 04/28/2018 CLINICAL DATA:  Altered mental status EXAM: CT HEAD WITHOUT CONTRAST TECHNIQUE: Contiguous axial images were obtained from the base of the skull through the vertex without intravenous contrast. COMPARISON:  None. FINDINGS: Brain: No acute intracranial abnormality. Specifically, no hemorrhage, hydrocephalus, mass lesion, acute infarction, or significant intracranial injury. Vascular: No hyperdense vessel or unexpected calcification. Skull: No acute calvarial abnormality. Sinuses/Orbits: Visualized paranasal sinuses and mastoids clear. Orbital soft tissues unremarkable. Other: None IMPRESSION: No intracranial abnormality. Electronically Signed   By: Rolm Baptise M.D.   On: 04/28/2018 12:18   US Renal  Result Date: 04/29/2018 CLINICAL DATA:  Acute renal failure EXAM: RENAL / URINARY TRACT ULTRASOUND COMPLETE COMPARISON:  Abdominal CT 05/16/2015 FINDINGS: Right Kidney: Renal measurements: 10 x 4 x 4 cm = volume: 87 mL. Simple cyst in the lower pole measuring 21 mm. No hydronephrosis or solid mass. Left Kidney: Renal measurements: 10 x 4 x 4 cm = volume: 86 mL. Echogenicity within normal limits. No mass or hydronephrosis visualized. Bladder: Appears normal for degree of bladder distention. Incidental lobulated but benign appearing cysts in the right liver, known from abdominal CT in 2016. Small right pleural effusion. IMPRESSION: 1. No acute finding or hydronephrosis. 2. Symmetric renal size with 80-90 cc volume. Electronically Signed   By: Monte Fantasia M.D.   On: 04/29/2018 10:25   Dg Chest Port 1  View  Result Date: 04/30/2018 CLINICAL DATA:  Shortness of breath. EXAM: PORTABLE CHEST 1 VIEW COMPARISON:  04/29/2018 FINDINGS: Endotracheal and enteric tubes have been removed. Right PICC terminates over the lower SVC. An ICD remains in place. Aeration of the lung bases has improved, however there are persistent patchy bibasilar airspace opacities as well as hazy opacities suggesting persistent small pleural effusions. No pneumothorax is identified. IMPRESSION: Interval extubation with improved aeration of the lung bases. Persistent small pleural effusions. Electronically Signed   By: Logan Bores M.D.   On: 04/30/2018 08:11   Dg Chest Port 1 View  Result Date: 04/29/2018 CLINICAL DATA:  Acute respiratory failure, shortness of breath, history of CHF, former smoker. EXAM: PORTABLE CHEST 1 VIEW COMPARISON:  Portable chest x-ray of April 28, 2018 FINDINGS: The endotracheal tube tip projects 3.1 cm above the carina. The lungs are reasonably well inflated. Patchy airspace opacities are present in the right mid and lower lung. The right hemidiaphragm is largely obscured and there is partial obscuration of the left hemidiaphragm. The esophagogastric tube tip in proximal port project below the GE junction. The cardiac silhouette is mildly enlarged but stable. The pulmonary vascularity is prominent centrally but there is no cephalization. The right-sided PICC line tip projects at the midportion of the SVC. The ICD is in stable position. IMPRESSION: Slightly improved appearance of the lungs with slightly decreased bibasilar densities. Persistent small bilateral pleural effusions and bibasilar atelectasis or pneumonia. Stable mild cardiac enlargement without pulmonary edema. The support tubes are in reasonable position. Electronically Signed   By: David  Martinique M.D.   On: 04/29/2018 08:21     Medications:   . amiodarone 60 mg/hr (04/30/18 0628)  . ampicillin-sulbactam (UNASYN) IV 3 g (04/30/18 0751)  .  dexmedetomidine (PRECEDEX) IV infusion 0.6 mcg/kg/hr (04/30/18 2500)  . feeding supplement (VITAL 1.5 CAL) Stopped (04/27/18 1233)  . heparin 1,200 Units/hr (04/30/18 3704)  . phenylephrine (NEO-SYNEPHRINE) Adult infusion Stopped (04/29/18 1642)   . aspirin  81 mg Per Tube Daily  . atorvastatin  40 mg Per Tube Daily  . chlorhexidine  15 mL  Mouth Rinse BID  . clopidogrel  75 mg Per Tube Daily  . feeding supplement (PRO-STAT SUGAR FREE 64)  30 mL Per Tube TID  . furosemide  40 mg Intravenous Once  . ipratropium  0.5 mg Nebulization Q6H  . lipase/protease/amylase  12,000 Units Oral Q8H  . mouth rinse  15 mL Mouth Rinse q12n4p  . metoprolol tartrate  5 mg Intravenous Q6H  . pantoprazole (PROTONIX) IV  40 mg Intravenous Daily  . sodium chloride flush  10-40 mL Intracatheter Q12H   acetaminophen **OR** acetaminophen, bisacodyl, bisacodyl, haloperidol lactate, ondansetron **OR** ondansetron (ZOFRAN) IV, sennosides, sodium chloride flush  Assessment/ Plan:  77 y.o. male with a PMHx of abdominal aortic aneurysm, coronary artery disease, congestive heart failure, exocrine pancreatic insufficiency, chronic kidney disease stage III baseline creatinine 1.6, prostate cancer, hypertension, who was admitted to [facility] on 04/26/2018 for evaluation of respiratory distress.  1.  Acute renal failure/chronic kidney disease stage III baseline creatinine 1.6.  It appears acute renal fialure now is multifactorial with contributions from concurrent illness and hypotension.   -Renal function continues to improve.  Creatinine down to 1.5.  No acute indication for dialysis.  Renal ultrasound reviewed.  2.  Hypotension.    Patient now off of phenylephrine.  Continue to monitor blood pressure closely.  3.  Acute respiratory failure.  Patient status post extubation and appears to be doing well thus far.   LOS: 4 Lon Klippel 11/14/20199:16 AM

## 2018-04-30 NOTE — Progress Notes (Signed)
Called to floor for intubation based on ABG results. Pt intubated by ICU MD without difficulty. Pt placed on vent settings per MD. Will continue to monitor

## 2018-04-30 NOTE — Progress Notes (Signed)
Progress Note  Patient Name: Matthew Zamora Date of Encounter: 04/30/2018  Primary Cardiologist: [facility]  Subjective   The patient converted to sinus rhythm last evening.  He continues to be on amiodarone drip.  He is lethargic as he was given Ativan for agitation.  Inpatient Medications    Scheduled Meds: . aspirin  81 mg Per Tube Daily  . atorvastatin  40 mg Per Tube Daily  . chlorhexidine  15 mL Mouth Rinse BID  . clopidogrel  75 mg Per Tube Daily  . feeding supplement (PRO-STAT SUGAR FREE 64)  30 mL Per Tube TID  . ipratropium  0.5 mg Nebulization Q6H  . lipase/protease/amylase  12,000 Units Oral Q8H  . mouth rinse  15 mL Mouth Rinse q12n4p  . metoprolol tartrate  5 mg Intravenous Q6H  . pantoprazole (PROTONIX) IV  40 mg Intravenous Daily  . sodium chloride flush  10-40 mL Intracatheter Q12H   Continuous Infusions: . amiodarone 60 mg/hr (04/30/18 1000)  . ampicillin-sulbactam (UNASYN) IV Stopped (04/30/18 0824)  . dexmedetomidine (PRECEDEX) IV infusion 0.8 mcg/kg/hr (04/30/18 1003)  . feeding supplement (VITAL 1.5 CAL) Stopped (04/27/18 1233)  . heparin 1,200 Units/hr (04/30/18 1000)  . phenylephrine (NEO-SYNEPHRINE) Adult infusion Stopped (04/29/18 1642)   PRN Meds: acetaminophen **OR** acetaminophen, bisacodyl, bisacodyl, haloperidol lactate, ondansetron **OR** ondansetron (ZOFRAN) IV, sennosides, sodium chloride flush   Vital Signs    Vitals:   04/30/18 0730 04/30/18 0800 04/30/18 0900 04/30/18 1000  BP:   (!) 145/68 (!) 146/84  Pulse: 62 92 61 70  Resp: 15 (!) 25 19 (!) 22  Temp:  97.8 F (36.6 C)    TempSrc:  Axillary    SpO2: 98% 98% 97% 99%  Weight:      Height:        Intake/Output Summary (Last 24 hours) at 04/30/2018 1046 Last data filed at 04/30/2018 1000 Gross per 24 hour  Intake 5957.63 ml  Output 1725 ml  Net 4232.63 ml   Filed Weights   04/28/18 0500 04/29/18 0305 04/30/18 0500  Weight: 86.1 kg 83.4 kg 86 kg    Telemetry      Normal sinus rhythm with heart rate in the 60s with PVCs- Personally Reviewed  ECG    Not performed today- Personally Reviewed  Physical Exam   GEN: No acute distress.   Neck: No JVD Cardiac: RRR, no murmurs, rubs, or gallops.  Respiratory: Clear to auscultation bilaterally. GI: Soft, nontender, non-distended  MS: No edema; No deformity. Neuro:  Nonfocal  Psych: Normal affect   Labs    Chemistry Recent Labs  Lab 04/28/18 0313 04/29/18 0302 04/29/18 1725 04/30/18 0423  NA 137 140 142 143  K 4.0 3.5 3.9 3.7  CL 103 104 104 106  CO2 24 26 25 26   GLUCOSE 122* 108* 132* 164*  BUN 33* 33* 31* 33*  CREATININE 2.52* 2.05* 1.70* 1.52*  CALCIUM 8.2* 8.4* 8.6* 8.5*  PROT 5.8* 5.5*  --  5.9*  ALBUMIN 3.0* 2.6*  --  2.7*  AST 74* 66*  --  52*  ALT 28 30  --  35  ALKPHOS 68 58  --  59  BILITOT 0.6 0.6  --  0.6  GFRNONAA 23* 30* 37* 43*  GFRAA 27* 35* 43* 50*  ANIONGAP 10 10 13 11      Hematology Recent Labs  Lab 04/28/18 0313 04/29/18 0302 04/30/18 0423  WBC 29.6* 17.5* 9.7  RBC 3.56* 3.20* 2.97*  HGB 10.6* 9.3* 8.5*  HCT 33.1* 28.6* 26.3*  MCV 93.0 89.4 88.6  MCH 29.8 29.1 28.6  MCHC 32.0 32.5 32.3  RDW 14.2 14.6 14.6  PLT 160 146* 126*    Cardiac Enzymes Recent Labs  Lab 04/26/18 0447 04/27/18 1125 04/27/18 1732 04/28/18 0313  TROPONINI 0.16* 0.43* 0.57* 0.44*   No results for input(s): TROPIPOC in the last 168 hours.   BNP Recent Labs  Lab 04/26/18 0030 04/27/18 1054 04/29/18 0302  BNP 717.0* 799.0* 889.0*     DDimer No results for input(s): DDIMER in the last 168 hours.   Radiology    Ct Head Wo Contrast  Result Date: 04/28/2018 CLINICAL DATA:  Altered mental status EXAM: CT HEAD WITHOUT CONTRAST TECHNIQUE: Contiguous axial images were obtained from the base of the skull through the vertex without intravenous contrast. COMPARISON:  None. FINDINGS: Brain: No acute intracranial abnormality. Specifically, no hemorrhage, hydrocephalus, mass  lesion, acute infarction, or significant intracranial injury. Vascular: No hyperdense vessel or unexpected calcification. Skull: No acute calvarial abnormality. Sinuses/Orbits: Visualized paranasal sinuses and mastoids clear. Orbital soft tissues unremarkable. Other: None IMPRESSION: No intracranial abnormality. Electronically Signed   By: Rolm Baptise M.D.   On: 04/28/2018 12:18   US Renal  Result Date: 04/29/2018 CLINICAL DATA:  Acute renal failure EXAM: RENAL / URINARY TRACT ULTRASOUND COMPLETE COMPARISON:  Abdominal CT 05/16/2015 FINDINGS: Right Kidney: Renal measurements: 10 x 4 x 4 cm = volume: 87 mL. Simple cyst in the lower pole measuring 21 mm. No hydronephrosis or solid mass. Left Kidney: Renal measurements: 10 x 4 x 4 cm = volume: 86 mL. Echogenicity within normal limits. No mass or hydronephrosis visualized. Bladder: Appears normal for degree of bladder distention. Incidental lobulated but benign appearing cysts in the right liver, known from abdominal CT in 2016. Small right pleural effusion. IMPRESSION: 1. No acute finding or hydronephrosis. 2. Symmetric renal size with 80-90 cc volume. Electronically Signed   By: Monte Fantasia M.D.   On: 04/29/2018 10:25   Dg Chest Port 1 View  Result Date: 04/30/2018 CLINICAL DATA:  Shortness of breath. EXAM: PORTABLE CHEST 1 VIEW COMPARISON:  04/29/2018 FINDINGS: Endotracheal and enteric tubes have been removed. Right PICC terminates over the lower SVC. An ICD remains in place. Aeration of the lung bases has improved, however there are persistent patchy bibasilar airspace opacities as well as hazy opacities suggesting persistent small pleural effusions. No pneumothorax is identified. IMPRESSION: Interval extubation with improved aeration of the lung bases. Persistent small pleural effusions. Electronically Signed   By: Logan Bores M.D.   On: 04/30/2018 08:11   Dg Chest Port 1 View  Result Date: 04/29/2018 CLINICAL DATA:  Acute respiratory failure,  shortness of breath, history of CHF, former smoker. EXAM: PORTABLE CHEST 1 VIEW COMPARISON:  Portable chest x-ray of April 28, 2018 FINDINGS: The endotracheal tube tip projects 3.1 cm above the carina. The lungs are reasonably well inflated. Patchy airspace opacities are present in the right mid and lower lung. The right hemidiaphragm is largely obscured and there is partial obscuration of the left hemidiaphragm. The esophagogastric tube tip in proximal port project below the GE junction. The cardiac silhouette is mildly enlarged but stable. The pulmonary vascularity is prominent centrally but there is no cephalization. The right-sided PICC line tip projects at the midportion of the SVC. The ICD is in stable position. IMPRESSION: Slightly improved appearance of the lungs with slightly decreased bibasilar densities. Persistent small bilateral pleural effusions and bibasilar atelectasis or pneumonia. Stable mild cardiac  enlargement without pulmonary edema. The support tubes are in reasonable position. Electronically Signed   By: David  Martinique M.D.   On: 04/29/2018 08:21    Cardiac Studies   Echocardiogram on April 26, 2018:  - Left ventricle: The cavity size was normal. There was moderate   focal basal hypertrophy of the septum. Systolic function was   mildly to moderately reduced. The estimated ejection fraction was   in the range of 40% to 45%. Images were inadequate for LV wall   motion assessment. The study is not technically sufficient to   allow evaluation of LV diastolic function. - Mitral valve: There was mild regurgitation. - Left atrium: The atrium was moderately dilated. - Right atrium: The atrium was mildly dilated. - Pulmonary arteries: Systolic pressure was moderately increased.   PA peak pressure: 50 mm Hg (S). - Inferior vena cava: The vessel was dilated. The respirophasic   diameter changes were blunted (< 50%), consistent with elevated   central venous pressure.  Patient  Profile     77 y.o. male with a hx of coronary artery disease, chronic systolic heart failure, ventricular tachycardia status post ICD, and chronic kidney disease stage III, who was admitted due to acute respiratory failure thought to be due to no pneumonia and developed intermittent episodes of atrial fibrillation with rapid ventricular response.  Assessment & Plan    1.  Paroxysmal atrial fibrillation with rapid ventricular response: The patient converted to sinus rhythm last evening.  He is still not taking oral medications reliably and thus I recommend continuing IV amiodarone infusion for now.  The patient is anticoagulated with heparin drip at the present time with relative least stable hemoglobin at 8.5.  We will have to decide on long-term anticoagulation before hospital discharge given chads vas score of at least 4.  2.  Coronary artery disease: Previous RCA stenting with 3 drug-eluting stents currently on dual antiplatelet therapy.  If we decide to place him on long-term anticoagulation, we will likely have to stop at least 1 of his antiplatelet medications.  3.  Chronic systolic heart failure: He appears to be euvolemic and renal function is improving.  4.  History of ventricular tachycardia: Followed by [facility] EP.       For questions or updates, please contact [place] Please consult [email or web address] for contact info under        Signed, Kathlyn [place], MD  04/30/2018, 10:46 AM

## 2018-04-30 NOTE — Progress Notes (Signed)
Patient was doing well - Patient was given some ice chips and subsequent to that patient had some issues with hypoxia - Patient was initially managed with nonrebreather switch to high flow cannula - Went and examined the patient, patient is slightly struggling to breathe, lots of wheezing on exam -Chest x-ray shows bilateral increased interstitial markings  Plan: - Continue high flow oxygen nasal cannula as patient is significantly claustrophobic and not able to tolerate BiPAP -Get stat ABG - Give Lasix 40, Solu-Medrol 60, nebulizer treatment - Monitor patient closely if no improvement patient might need to be intubated -Plan of care discussed with patient's family at length

## 2018-04-30 NOTE — Consult Note (Signed)
Reason for Consult: Altered mental status Referring Physician: Lahoma Rocker, MD  CC: Altered mental status  HPI: Matthew Zamora is an 77 y.o. male with significant history of coronary artery disease, CHF, ischemic cardiomyopathy, ventricular tachycardia, Barrett's esophagus, prostate cancer, diabetes, hyperlipidemia, implantable cardioverter defibrillator, and hypertension  presenting to the ED on 04/26/2018 in severe respiratory distress and hypertensive crisis.  Per ED report, patient came in severe distress with noted altered mental status with agitation and combativeness.  Work-up in the ED shows elevated white counts of 13.2, glucose 200, elevated creatinine of 1.85, decreased kidney functions, troponin 0.03, BNP of 717.  Chest x-ray showed left sided airspace disease consistent with pneumonia, he was also noted to be hypothermic.  Rapid sequence intubation was performed in the ED due to unresponsiveness and need to secure airway.  Patient was subsequently transferred to the ICU for further management.  During the course of his admission he developed A. fib with RVR  and was therefore started on amiodarone drip.  He remains altered and delirious with intermittent episodes of agitation requiring sedation.  Patient was able to be successfully extubated yesterday and currently doing well respiratory wise but still anxious, agitated and restless.  He was given Zyprexa with limited relief therefore started on low-dose Precedex.  He continues on vancomycin plus Unasyn with improvement in his white count noted.  Neurology has been consulted due to ongoing agitation with normal CT head showing no acute intracranial abnormality.  Past Medical History:  Diagnosis Date  . Abdominal aortic aneurysm (AAA) ([place]) 05/13/15   seen on ct scan  . 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 Anemia   . 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 CAD (coronary artery disease)   . Cataract cortical,  senile   . CHF (congestive heart failure) ([place])   . Chronic hoarseness   . Exocrine pancreatic insufficiency   . H. pylori infection   . History of hepatitis   . Hyperlipidemia   . Hypertension   . 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 Prostate CA [facility])     Past Surgical History:  Procedure Laterality Date  . CATARACT EXTRACTION    . COLONOSCOPY  10/06/2014, 09/18/2004, 03/14/2009  . ESOPHAGOGASTRODUODENOSCOPY  10/06/2014, 03/18/2001, 03/14/2009  . FLEXIBLE SIGMOIDOSCOPY  08/26/1990  . INSERTION OF ICD    . PROSTATE SURGERY    . TONSILLECTOMY      Family History  Problem Relation Age of Onset  . Heart attack Mother   . Heart attack Father     Social History:  reports that he quit smoking about 18 years ago. His smoking use included cigarettes. He has a 38.00 pack-year smoking history. He has never used smokeless tobacco. He reports that he does not drink alcohol. His drug history is not on file.  No Known Allergies  Medications:  I have reviewed the patient's current medications. Prior to Admission:  Medications Prior to Admission  Medication Sig Dispense Refill Last Dose  . aspirin EC 81 MG tablet Take 81 mg by mouth daily.   Unknown at Unknown  . atorvastatin (LIPITOR) 40 MG tablet Take 40 mg by mouth daily.   Unknown at Unknown  . clopidogrel (PLAVIX) 75 MG tablet Take 75 mg by mouth daily.  5 Unknown at Unknown  . furosemide (LASIX) 40 MG tablet Take 20 mg by mouth daily.   Unknown at Unknown  . lisinopril (PRINIVIL,ZESTRIL) 5 MG tablet Take 5 mg by mouth daily.   Unknown at Unknown  .  magnesium (MAGTAB) 84 MG (7MEQ) TBCR SR tablet Take 84 mg by mouth daily.   Unknown at Unknown  . Melatonin 1 MG TABS Take 1 tablet by mouth at bedtime as needed.   prn at prn  . metoprolol succinate (TOPROL-XL) 100 MG 24 hr tablet Take 100 mg by mouth daily. Take with or immediately following a meal.   Unknown at Unknown  . Multiple Vitamins-Minerals (MULTIVITAMIN WITH MINERALS) tablet Take 1 tablet by  mouth daily.   Unknown at Unknown  . omeprazole (PRILOSEC) 20 MG capsule Take 20 mg by mouth daily.   Unknown at Unknown  . oxybutynin (DITROPAN XL) 15 MG 24 hr tablet Take 15 mg by mouth at bedtime.   Unknown at Unknown  . lipase/protease/amylase (CREON) 12000 units CPEP capsule Take 24,000 Units by mouth 2 (two) times daily at 10 am and 4 pm.   Not Taking at Unknown time   Scheduled: . aspirin  81 mg Per Tube Daily  . atorvastatin  40 mg Per Tube Daily  . chlorhexidine  15 mL Mouth Rinse BID  . clopidogrel  75 mg Per Tube Daily  . feeding supplement (PRO-STAT SUGAR FREE 64)  30 mL Per Tube TID  . insulin aspart  0-9 Units Subcutaneous Q6H  . ipratropium  0.5 mg Nebulization Q6H  . lipase/protease/amylase  12,000 Units Oral Q8H  . mouth rinse  15 mL Mouth Rinse q12n4p  . metoprolol tartrate  5 mg Intravenous Q6H  . pantoprazole (PROTONIX) IV  40 mg Intravenous Daily  . sodium chloride flush  10-40 mL Intracatheter Q12H    ROS: History obtained from the patient   Physical Examination: Blood pressure (!) 146/84, pulse 70, temperature 97.8 F (36.6 C), temperature source Axillary, resp. rate (!) 22, height 5\' 11"  (1.803 m), weight 86 kg, SpO2 99 %.  HEENT-  Normocephalic, no lesions, without obvious abnormality.  Normal external eye and conjunctiva.  Normal TM's bilaterally.  Normal auditory canals and external ears. Normal external nose, mucus membranes and septum.  Normal pharynx. Cardiovascular- S1, S2 normal, pulses palpable throughout   Lungs- chest clear, no wheezing, rales, normal symmetric air entry Abdomen- soft, non-tender; bowel sounds normal; no masses,  no organomegaly Extremities- no edema Lymph-no adenopathy palpable Musculoskeletal-no joint tenderness, deformity or swelling Skin-warm and dry, no hyperpigmentation, vitiligo, or suspicious lesions  Neurological Exam   Mental Status: Sedated. Does not open eyes to verbal or sternal rub. Speech mumbled and  incomprehensible. Unable to follow commands. Very restless and agitated. Trying to sit up in the bed.   Cranial Nerves: II: Discs flat bilaterally; Unable to assess visual fields.  Pupils equal, round, reactive to light. Cannot assess accommodation III,IV, VI: ptosis not present, oculocephalic reflex present V,VII: corneals intact bilaterally VIII: unable to assess IX,X: gag reflex present XI: unable to assess XII: Unable to assess Motor: Moves bilateral upper and lower extremities spontaneously, withdraws lowers to noxious stimuli.  Moves left upper and lower extremities more than right.   Tone and bulk:normal tone throughout; no atrophy noted Sensory: Does not respond to noxious stimuli in any extremity Deep Tendon Reflexes: 2+ and symmetric throughout Plantars: Right: mute                             Left: mute Cerebellar: Unable to assess due to mental status Gait: not tested due to safety concerns  Data Reviewed  Laboratory Studies:   Basic Metabolic Panel: Recent  Labs  Lab 04/27/18 1125 04/27/18 1732 04/28/18 0313 04/29/18 0302 04/29/18 1725 04/30/18 0423  NA 133* 133* 137 140 142 143  K 2.9* 4.0 4.0 3.5 3.9 3.7  CL 104 101 103 104 104 106  CO2 18* 21* 24 26 25 26   GLUCOSE 297* 152* 122* 108* 132* 164*  BUN 18 25* 33* 33* 31* 33*  CREATININE 1.26* 1.81* 2.52* 2.05* 1.70* 1.52*  CALCIUM 5.1* 6.8* 8.2* 8.4* 8.6* 8.5*  MG 1.3*  1.2*  --  1.8 2.0 1.9 2.1  PHOS 2.4*  --  4.6  --   --   --     Liver Function Tests: Recent Labs  Lab 04/26/18 0030 04/28/18 0313 04/29/18 0302 04/30/18 0423  AST 24 74* 66* 52*  ALT 16 28 30  35  ALKPHOS 79 68 58 59  BILITOT 0.7 0.6 0.6 0.6  PROT 7.5 5.8* 5.5* 5.9*  ALBUMIN 4.2 3.0* 2.6* 2.7*   No results for input(s): LIPASE, AMYLASE in the last 168 hours. Recent Labs  Lab 04/29/18 0302  AMMONIA 21    CBC: Recent Labs  Lab 04/26/18 0030 04/26/18 0447 04/27/18 0948 04/28/18 0313 04/29/18 0302 04/30/18 0423  WBC  13.2* 14.8* 20.9* 29.6* 17.5* 9.7  NEUTROABS 7.7  --   --   --  15.3* 9.0*  HGB 13.0 10.9* 9.4* 10.6* 9.3* 8.5*  HCT 40.5 35.4* 30.0* 33.1* 28.6* 26.3*  MCV 91.4 93.2 94.3 93.0 89.4 88.6  PLT 237 191 142* 160 146* 126*    Cardiac Enzymes: Recent Labs  Lab 04/26/18 0030 04/26/18 0447 04/27/18 1125 04/27/18 1732 04/28/18 0313  TROPONINI 0.03* 0.16* 0.43* 0.57* 0.44*    BNP: Invalid input(s): POCBNP  CBG: Recent Labs  Lab 04/29/18 0543 04/29/18 1251 04/29/18 1746 04/29/18 2337 04/30/18 0553  GLUCAP 100* 135* 127* 144* 146*    Microbiology: Results for orders placed or performed during the hospital encounter of 04/26/18  Blood culture (routine x 2)     Status: None (Preliminary result)   Collection Time: 04/26/18  1:54 AM  Result Value Ref Range Status   Specimen Description BLOOD LEFT FATTY CASTS  Final   Special Requests   Final    BOTTLES DRAWN AEROBIC AND ANAEROBIC Blood Culture adequate volume   Culture   Final    NO GROWTH 4 DAYS Performed at [facility], [address] [place], [place] [address]    Report Status PENDING  Incomplete  Blood culture (routine x 2)     Status: None (Preliminary result)   Collection Time: 04/26/18  1:55 AM  Result Value Ref Range Status   Specimen Description BLOOD LEFT ASSIST CONTROL  Final   Special Requests   Final    BOTTLES DRAWN AEROBIC AND ANAEROBIC Blood Culture results may not be optimal due to an excessive volume of blood received in culture bottles   Culture   Final    NO GROWTH 4 DAYS Performed at [facility], [address] [place],  [address]    Report Status PENDING  Incomplete  MRSA PCR Screening     Status: None   Collection Time: 04/26/18  2:54 AM  Result Value Ref Range Status   MRSA by PCR NEGATIVE NEGATIVE Final    Comment:        The GeneXpert MRSA Assay (FDA approved for NASAL specimens only), is one component of a comprehensive MRSA colonization surveillance program.  It is not intended to diagnose MRSA infection nor to guide or  monitor treatment for MRSA infections. Performed at [facility], [address] [place], [place] [address]   Culture, respiratory (non-expectorated)     Status: None   Collection Time: 04/27/18 10:06 AM  Result Value Ref Range Status   Specimen Description   Final    TRACHEAL ASPIRATE Performed at [facility], [address], [place], [place] [address]    Special Requests   Final    NONE Performed at [facility], [address] [place], [place] [address]    Gram Stain   Final    FEW WBC PRESENT, PREDOMINANTLY PMN RARE GRAM POSITIVE RODS    Culture   Final    FEW Consistent with normal respiratory flora. Performed at [facility], [address], [place], [place] [address]    Report Status 04/29/2018 FINAL  Final  CULTURE, BLOOD (ROUTINE X 2) w Reflex to ID Panel     Status: None (Preliminary result)   Collection Time: 04/28/18 11:52 AM  Result Value Ref Range Status   Specimen Description BLOOD LEFT HAND  Final   Special Requests   Final    BOTTLES DRAWN AEROBIC AND ANAEROBIC Blood Culture results may not be optimal due to an inadequate volume of blood received in culture bottles   Culture   Final    NO GROWTH 2 DAYS Performed at [facility], [address] [place], [place] [address]    Report Status PENDING  Incomplete  Culture, blood (Routine X 2) w Reflex to ID Panel     Status: None (Preliminary result)   Collection Time: 04/28/18  1:47 PM  Result Value Ref Range Status   Specimen Description BLOOD BLOOD LEFT HAND  Final   Special Requests   Final    BOTTLES DRAWN AEROBIC AND ANAEROBIC Blood Culture adequate volume   Culture   Final    NO GROWTH 2 DAYS Performed at [facility], [address] [name], [place] [address]    Report Status PENDING  Incomplete    Coagulation Studies: Recent Labs    04/29/18 0302  LABPROT 14.3  INR 1.12     Urinalysis:  Recent Labs  Lab 04/26/18 0030  COLORURINE YELLOW*  LABSPEC 1.008  PHURINE 6.0  GLUCOSEU NEGATIVE  HGBUR NEGATIVE  BILIRUBINUR NEGATIVE  KETONESUR NEGATIVE  PROTEINUR NEGATIVE  NITRITE NEGATIVE  LEUKOCYTESUR NEGATIVE    Lipid Panel:     Component Value Date/Time   TRIG 155 (H) 04/27/2018 1946    HgbA1C: No results found for: HGBA1C  Urine Drug Screen:      Component Value Date/Time   LABOPIA NONE DETECTED 04/26/2018 0030   COCAINSCRNUR NONE DETECTED 04/26/2018 0030   LABBENZ NONE DETECTED 04/26/2018 0030   AMPHETMU NONE DETECTED 04/26/2018 0030   THCU NONE DETECTED 04/26/2018 0030   LABBARB NONE DETECTED 04/26/2018 0030    Alcohol Level:  Recent Labs  Lab 04/26/18 0102  ETH 10*    Other results: EKG: unchanged from previous tracings, sinus tachycardia. Vent. rate 109 BPM PR interval * ms QRS duration 104 ms QT/QTc 334/450 ms P-R-T axes 0 57 118  Imaging: Ct Head Wo Contrast  Result Date: 04/28/2018 CLINICAL DATA:  Altered mental status EXAM: CT HEAD WITHOUT CONTRAST TECHNIQUE: Contiguous axial images were obtained from the base of the skull through the vertex without intravenous contrast. COMPARISON:  None. FINDINGS: Brain: No acute intracranial abnormality. Specifically, no hemorrhage, hydrocephalus, mass lesion, acute infarction, or significant intracranial injury. Vascular: No  hyperdense vessel or unexpected calcification. Skull: No acute calvarial abnormality. Sinuses/Orbits: Visualized paranasal sinuses and mastoids clear. Orbital soft tissues unremarkable. Other: None IMPRESSION: No intracranial abnormality. Electronically Signed   By: Rolm Baptise M.D.   On: 04/28/2018 12:18   US Renal  Result Date: 04/29/2018 CLINICAL DATA:  Acute renal failure EXAM: RENAL / URINARY TRACT ULTRASOUND COMPLETE COMPARISON:  Abdominal CT 05/16/2015 FINDINGS: Right Kidney: Renal measurements: 10 x 4 x 4 cm = volume: 87 mL. Simple cyst in the lower pole  measuring 21 mm. No hydronephrosis or solid mass. Left Kidney: Renal measurements: 10 x 4 x 4 cm = volume: 86 mL. Echogenicity within normal limits. No mass or hydronephrosis visualized. Bladder: Appears normal for degree of bladder distention. Incidental lobulated but benign appearing cysts in the right liver, known from abdominal CT in 2016. Small right pleural effusion. IMPRESSION: 1. No acute finding or hydronephrosis. 2. Symmetric renal size with 80-90 cc volume. Electronically Signed   By: Monte Fantasia M.D.   On: 04/29/2018 10:25   Dg Chest Port 1 View  Result Date: 04/30/2018 CLINICAL DATA:  Shortness of breath. EXAM: PORTABLE CHEST 1 VIEW COMPARISON:  04/29/2018 FINDINGS: Endotracheal and enteric tubes have been removed. Right PICC terminates over the lower SVC. An ICD remains in place. Aeration of the lung bases has improved, however there are persistent patchy bibasilar airspace opacities as well as hazy opacities suggesting persistent small pleural effusions. No pneumothorax is identified. IMPRESSION: Interval extubation with improved aeration of the lung bases. Persistent small pleural effusions. Electronically Signed   By: Logan Bores M.D.   On: 04/30/2018 08:11   Dg Chest Port 1 View  Result Date: 04/29/2018 CLINICAL DATA:  Acute respiratory failure, shortness of breath, history of CHF, former smoker. EXAM: PORTABLE CHEST 1 VIEW COMPARISON:  Portable chest x-ray of April 28, 2018 FINDINGS: The endotracheal tube tip projects 3.1 cm above the carina. The lungs are reasonably well inflated. Patchy airspace opacities are present in the right mid and lower lung. The right hemidiaphragm is largely obscured and there is partial obscuration of the left hemidiaphragm. The esophagogastric tube tip in proximal port project below the GE junction. The cardiac silhouette is mildly enlarged but stable. The pulmonary vascularity is prominent centrally but there is no cephalization. The right-sided  PICC line tip projects at the midportion of the SVC. The ICD is in stable position. IMPRESSION: Slightly improved appearance of the lungs with slightly decreased bibasilar densities. Persistent small bilateral pleural effusions and bibasilar atelectasis or pneumonia. Stable mild cardiac enlargement without pulmonary edema. The support tubes are in reasonable position. Electronically Signed   By: David  Martinique M.D.   On: 04/29/2018 08:21   Patient seen and examined.  Clinical course and management discussed.  Necessary edits performed.  I agree with the above.  Assessment and plan of care developed and discussed below.    Assessment: 77 y.o male with significant history of coronary artery disease, CHF, ischemic cardiomyopathy, ventricular tachycardia, Barrett's esophagus, prostate cancer, diabetes, hyperlipidemia, implantable cardioverter defibrillator, and hypertension  presenting to the ED on 04/26/2018 in severe respiratory distress and hypertensive crisis. Now with altered mental status in the setting of suspected PNA and atrial fibrillation (now converted, on heparin). Extubated yesterday but remains on sedation due to continued agitation and restlessness. CT head reviewed and show no acute intracranial abnormality.  Although this may be metabolic, with atrial fibrillation can not rule out a shower of emboli. Unable to obtain MRI brain due  to ICD.  Unclear if cognitive issues present at baseline since patient with issues of hallucinations over the past few months.    Plan 1. EEG 2. Agree with current medical management 3. If no improvement tomorrow will consider repeat of head CT  This patient was staffed with Dr. Magda Paganini, Doy Mince who personally evaluated patient, reviewed documentation and agreed with assessment and plan of care as above.  Rufina Falco, DNP, FNP-BC Board certified Nurse Practitioner Neurology Department  04/30/2018, 10:46 AM   Case discussed with Dr. Titus Dubin, MD Neurology [phone]  04/30/2018  2:55 PM

## 2018-04-30 NOTE — Progress Notes (Signed)
Per Lourdes Sledge we will only give 2 runs K, other 2 are not on the unit, but he will address tomorrow AM, K was 3.7 this morning

## 2018-04-30 NOTE — Progress Notes (Signed)
Pt with extreme agitation w/precedex off, tachy, freq PVCs and run of bigeminy w/ amiodarone at 30 mg/hr, also hypertensive, face red, unable to be redirecrted, hypoxic at 80 on 2 liters, placed NRB, restarted precedex and also amiodarone at 60 mg/hr, Per Dr Manuella Ghazi get a stat CXR and place HFNC.  CN contacted RT and HFNC started

## 2018-04-30 NOTE — Progress Notes (Signed)
Pharmacy Antibiotic Note  Matthew Zamora is a 77 y.o. male admitted on 04/26/2018 with respiratory distress. Patient was admitted via EMS from home. Patient was intubated and chest x-ray is positive for pneumonia. WBC went down to WNL and patient remained afebrile on 11/14. Cultures have been negative so far. Pharmacy has been consulted for Vancomycin and Unasyn dosing.  Plan: Per discussion with CCM on morning rounds on 11/14, vancomycin was discontinued.   Patient's renal function improved; continue Unasyn 3 g IV Q6h.   Height: 5\' 11"  (180.3 cm) Weight: 189 lb 9.5 oz (86 kg) IBW/kg (Calculated) : 75.3  Temp (24hrs), Avg:97.4 F (36.3 C), Min:95.5 F (35.3 C), Max:99.5 F (37.5 C)  Recent Labs  Lab 04/26/18 0154 04/26/18 0447 04/27/18 0948  04/27/18 1732 04/28/18 0313 04/29/18 0302 04/29/18 1725 04/30/18 0423  WBC  --  14.8* 20.9*  --   --  29.6* 17.5*  --  9.7  CREATININE  --  1.73*  --    < > 1.81* 2.52* 2.05* 1.70* 1.52*  LATICACIDVEN 1.4 1.6  --   --   --   --   --   --   --    < > = values in this interval not displayed.    Estimated Creatinine Clearance: 44 mL/min (A) (by C-G formula based on SCr of 1.52 mg/dL (H)).    No Known Allergies  Antimicrobials this admission: Unasyn 11/12 >> Vancomycin 11/12 >> 11/14 Azithromycin 11/10 >> 11/12 Ceftriaxone 11/10 >> 11/12  Dose adjustments this admission: 11/13 Unasyn 3g Q12h >>> Unasyn 3g Q6h  Microbiology results: 11/10 BCx: NG x 4 days 11/11 Sputum: normal flora 11/10 MRSA PCR: negative 11/12 BCx: NG x 2 days   Thank you for allowing pharmacy to be a part of this patient's care.  Diangelo Radel 04/30/2018 11:53 AM

## 2018-04-30 NOTE — Progress Notes (Signed)
Nutrition Follow-up  DOCUMENTATION CODES:   Not applicable  INTERVENTION:  Will continue to monitor for diet advancement and recommend appropriate nutrition intervention at that time.  NUTRITION DIAGNOSIS:   Inadequate oral intake related to inability to eat as evidenced by NPO status.  Ongoing.  GOAL:   Patient will meet greater than or equal to 90% of their needs  Not met.  MONITOR:   Diet advancement, Labs, Weight trends, I & O's  REASON FOR ASSESSMENT:   Ventilator, Consult Enteral/tube feeding initiation and management  ASSESSMENT:   77 year old male with PMHx of prostate cancer s/p surgery, HTN, anemia, Barrett esophagus, CKD stage III, CAD, CHF, HLD, exocrine pancreatic insufficiency on Creon, AAA who is admitted with acute respiratory failure secondary to PNA requiring intubation on 11/10, also with septic shock.  Patient was extubated on 11/13. Patient was not appropriate for bedside swallow evaluation with SLP today. Plan is to reassess when patient is more alert/appropriate. Will continue to monitor for diet advancement.  Medications reviewed and include: Novolog 0-9 units Q6hrs, Creon 12000 units Q8hrs, pantoprazole, amiodarone gtt, Unasyn, Precedex gtt, heparin gtt.  Labs reviewed: CBG 127-146, BUN 33, Creatinine 1.52.  Discussed with RN and on rounds.  Diet Order:   Diet Order    None     EDUCATION NEEDS:   No education needs have been identified at this time  Skin:  Skin Assessment: Reviewed RN Assessment  Last BM:  04/29/2018 - small type 6  Height:   Ht Readings from Last 1 Encounters:  04/26/18 '5\' 11"'  (1.803 m)   Weight:   Wt Readings from Last 1 Encounters:  04/30/18 86 kg   Ideal Body Weight:  78.2 kg  BMI:  Body mass index is 26.44 kg/m.  Estimated Nutritional Needs:   Kcal:  1900-2220 (MSJ x 1.2-1.4)  Protein:  100-125 grams (1.2-1.5 grams/kg)  Fluid:  2 L/day (25 mL/kg)  Willey Blade, MS, RD, LDN Office:  [phone] Pager: [phone] After Hours/Weekend Pager: [phone]

## 2018-04-30 NOTE — Progress Notes (Addendum)
PULMONARY / CRITICAL CARE MEDICINE   NAME:  Matthew Zamora, MRN:  [id number], DOB:  02/15/1941, LOS: 4 ADMISSION DATE:  04/26/2018   PT PROFILE:    60 M adm via ED with acute AMS/delirium, hypoxemia, tachypnea, labile BP, leukocytosis, suspected pneumonia.  Developed AFRVR on day following admission.  04/30/2018: -No major events overnight -Creatinine improved with diuresis still 9 L positive -Head CT was done  did not reveal any acute abnormality - Patient was liberated from ventilator on 1113, subsequent to that patient remained agitated little confused required some Precedex and Zyprexa, -Cardiology consult appreciated - Currently on Vanco plus Unasyn, WBC is improving, creatinine is improving  SIGNIFICANT STUDIES/EVENTS:  11/10 Echcoardiogram: LVEF 40-45%.  Mild MR.  LA moderately dilated.  RVSP estimate 50 mmHg 11/11 Developed AFRVR. NE changed to PE. Amiodarone infusion intitiated.  11/12 Severe intermittent agitation. Propofol initiated, ABG improved with vent adjustment, head CT done negative 11/13: Doing okay, last night went into A. fib with slightly higher rate in 100s, increase amnio dose, SBT and extubated agitation required Precedex 11/14: Doing well, ABG and everything else acceptable, slight drop in platelet to 126, neurology consult requested because of ongoing agitation   CULTURES:  MRSA PCR 11/10 >> NEG Resp 11/11 >> G PR Blood 11/10 >>  Blood: 04/28/2018 Urine strep Ag 11/11 >>  Urine Legionella Ag 11/11 >>   ANTIBIOTICS:  Azithromycin 11/10 >> stopped at 04/28/2018 Ceftriaxone 11/10 >> stopped at 04/28/2018 Vancomycin: 04/28/2018-04/30/2018 Unasyn: 04/28/2018    LINES/TUBES:  ETT 11/10 >>  RUE PICC 11/10 >>   CONSULTANTS:  Nephrology Cardiology Neurology   SUBJECTIVE:  Intermittent agitation.  When calm, synchronous with mechanical ventilation.  Not F/C.  CONSTITUTIONAL: BP 127/66   Pulse 62   Temp (!) 96.3 F (35.7 C)   Resp 15   Ht  5\' 11"  (1.803 m)   Wt 86 kg   SpO2 98%   BMI 26.44 kg/m   I/O last 3 completed shifts: In: 7245.5 [I.V.:3136.7; NG/GT:2673.3; IV Piggyback:1435.4] Out: 2760 [Urine:2760]  PHYSICAL EXAM: General: Liberated from vent currently on 3 L oxygen Neuro: Drowsy but arousable, moves all extremities HEENT: NCAT, sclerae white Cardiovascular: IRIR, tachy, no M noted Lungs: Scattered bilateral rhonchi, no wheezes Abdomen: soft, NT, + BS Ext: warm, no edema Skin: No lesions noted  BMP Latest Ref Rng & Units 04/30/2018 04/29/2018 04/29/2018  Glucose 70 - 99 mg/dL 164(H) 132(H) 108(H)  BUN 8 - 23 mg/dL 33(H) 31(H) 33(H)  Creatinine 0.61 - 1.24 mg/dL 1.52(H) 1.70(H) 2.05(H)  Sodium 135 - 145 mmol/L 143 142 140  Potassium 3.5 - 5.1 mmol/L 3.7 3.9 3.5  Chloride 98 - 111 mmol/L 106 104 104  CO2 22 - 32 mmol/L 26 25 26   Calcium 8.9 - 10.3 mg/dL 8.5(L) 8.6(L) 8.4(L)   CBC Latest Ref Rng & Units 04/30/2018 04/29/2018 04/28/2018  WBC 4.0 - 10.5 K/uL 9.7 17.5(H) 29.6(H)  Hemoglobin 13.0 - 17.0 g/dL 8.5(L) 9.3(L) 10.6(L)  Hematocrit 39.0 - 52.0 % 26.3(L) 28.6(L) 33.1(L)  Platelets 150 - 400 K/uL 126(L) 146(L) 160    Cardiac Panel (last 3 results) Recent Labs    04/27/18 1125 04/27/18 1732 04/28/18 0313  TROPONINI 0.43* 0.57* 0.44*   BNP    Component Value Date/Time   BNP 889.0 (H) 04/29/2018 0302    LABS  Glucose Recent Labs  Lab 04/28/18 2319 04/29/18 0543 04/29/18 1251 04/29/18 1746 04/29/18 2337 04/30/18 0553  GLUCAP 78 100* 135* 127* 144* 146*  BMET Recent Labs  Lab 04/29/18 0302 04/29/18 1725 04/30/18 0423  NA 140 142 143  K 3.5 3.9 3.7  CL 104 104 106  CO2 26 25 26   BUN 33* 31* 33*  CREATININE 2.05* 1.70* 1.52*  GLUCOSE 108* 132* 164*    Liver Enzymes Recent Labs  Lab 04/28/18 0313 04/29/18 0302 04/30/18 0423  AST 74* 66* 52*  ALT 28 30 35  ALKPHOS 68 58 59  BILITOT 0.6 0.6 0.6  ALBUMIN 3.0* 2.6* 2.7*    Electrolytes Recent Labs  Lab  04/27/18 1125  04/28/18 0313 04/29/18 0302 04/29/18 1725 04/30/18 0423  CALCIUM 5.1*   < > 8.2* 8.4* 8.6* 8.5*  MG 1.3*  1.2*  --  1.8 2.0 1.9 2.1  PHOS 2.4*  --  4.6  --   --   --    < > = values in this interval not displayed.    CBC Recent Labs  Lab 04/28/18 0313 04/29/18 0302 04/30/18 0423  WBC 29.6* 17.5* 9.7  HGB 10.6* 9.3* 8.5*  HCT 33.1* 28.6* 26.3*  PLT 160 146* 126*    ABG Recent Labs  Lab 04/29/18 0900 04/29/18 1107 04/30/18 0500  PHART 7.38 7.51* 7.42  PCO2ART 43 30* 43  PO2ART 66* 55* 110*    Coag's Recent Labs  Lab 04/26/18 0030 04/29/18 0302  APTT 29  --   INR 0.99 1.12    Sepsis Markers Recent Labs  Lab 04/26/18 0030 04/26/18 0154 04/26/18 0447  LATICACIDVEN  --  1.4 1.6  PROCALCITON <0.10  --   --     Cardiac Enzymes Recent Labs  Lab 04/27/18 1125 04/27/18 1732 04/28/18 0313  TROPONINI 0.43* 0.57* 0.44*    ASSESSMENT AND PLAN   Acute ventilator dependent respiratory failure with hypoxemia-liberated from ventilator on 04/29/2018, post extubation complained of some sort shortness of breath and stridor received Solu-Medrol doing better, ABG is acceptable Suspected community-acquired pneumonia treated with Rocephin and Zithromax WBC was going up now on Unasyn doing well Suspected pulmonary edema-about 9 to 10 L positive on and off received Lasix will give another dose today Hypotension improved off pressors Paroxysmal atrial fibrillation-cardiology evaluated currently on amiodarone drip and heparin drip AKI, nonoliguric-improvement noted Severe sepsis, septic shock improvement noted Acute encephalopathy with intermittent agitation-questionable etiology, currently on Precedex we will plan to taper it down, use Zyprexa/haloperidol, head CT was okay, will get neurology opinion  PLAN:  RKY:[id number] changed to phenylephrine 11/11- currently off Amiodarone infusion initiated 11/11 for A. fib-dose was increased on 1113, defer to  cardiology Give large dose of Lasix-Lasix 40 ordered today EF is 40 to 45%, history of AICD by Dr. Luana Shu in the past, mild leak of troponin will continue with aspirin/Plavix-recent RCA stent- cardiology is currently following, currently on heparin drip for A. fib, drop in platelets noted will monitor RS: Liberated from vent doing well, was started on steroids for questionable upper airway edema however patient is getting more agitated and confused so steroids might be contributing to it and will stop it -- Aspiration precaution --Atrovent -Continue with the bronchodilators avoid albuterol due to A. fib ID: Will DC vancomycin today we will continue with Unasyn, adjust with culture ENDO: TSH is ok -- SSI monitor blood sugar GI: Speech evaluation and can start diet if able to tolerate it, PPI RENAL: Renal consult is called, see response with large dose of Lasix -- Electrolyte replacement protocol, Monitor Cr and K  CNS: Currently on Precedex plan is to taper  that off, also received Ativan last night which might have contributed to confusion, will get neurology consult, if indicated will repeat head CT/MRI HEMATOLOGY: Monitor Hb, Drop in PLT noted on heparin will monitor closely -- Monitor HB and PLT MUSCULOSKELETAL: No acute issues PAIN AND SEDATION: Currently on Precedex plan to taper it down  Skin/Wound: Chronic changes  Electrolytes: Replace electrolytes per ICU electrolyte replacement protocol.   IVF: none  Nutrition: Tube feeds as tolerated  Prophylaxis: DVT Prophylaxis with heparin,. GI Prophylaxis.   Restraints: Soft limb as he is trying to pull the tube on the case regardless  PT/OT eval and treat. OOB when appropriate.   Lines/Tubes: 04/26/2018 Foley pick 04/26/2018 WOR central line. ADVANCE DIRECTIVE: Full code- palliative is following with the family FAMILY DISCUSSION: Spoke with son and daughter-in-law on multiple occasion at length Quality Care: PPI, DVT prophylaxis, HOB  elevated, Infection control all reviewed and addressed. Events and notes from last 24 hours reviewed. Care plan discussed on multidisciplinary rounds CC TIME: 35 minutes     Old records reviewed discussed results and management plan with patient  Images personally reviewed and results and labs reviewed and discussed with patient.  All medication reviewed and adjusted  Further management depending on test results and work up as outlined above.   Lahoma Rocker, M.D

## 2018-04-30 NOTE — Progress Notes (Signed)
SLP Cancellation Note  Patient Details Name: Matthew Zamora MRN: [id number] DOB: Dec 24, 1940   Cancelled treatment:       Reason Eval/Treat Not Completed: Medical issues which prohibited therapy;Patient's level of consciousness; Consult received. Chart reviewed. Attempted evaluation at 10am, 11:15am and 2:30pm. Discussed patient with nursing. Observed attempt to arouse patient by NP. Patient became increasingly agitated with repeated attempts to arouse, moving arms around and making weak attempts to get up raising torso up somewhat on bed. Did not observe patient to open his eyes given repeated stimuli and attempts to arouse. Patient not able to participate in bedside swallow evaluation at this time. Will re-attempt tomorrow if alert and appropriate for evaluation.   Deyjah Kindel, MA, CCC-SLP 04/30/2018, 2:41 PM

## 2018-04-30 NOTE — Progress Notes (Signed)
MEDICATION RELATED CONSULT NOTE - FOLLOW UP   Pharmacy Consult for Amiodarone DDIs Indication: A-fib  No Known Allergies  Patient Measurements: Height: 5\' 11"  (180.3 cm) Weight: 189 lb 9.5 oz (86 kg) IBW/kg (Calculated) : 75.3   Vital Signs: Temp: 97.6 F (36.4 C) (11/14 1200) Temp Source: Axillary (11/14 1200) BP: 133/76 (11/14 1300) Pulse Rate: 65 (11/14 1300) Intake/Output from previous day: 11/13 0701 - 11/14 0700 In: 5595.7 [I.V.:1586.9; NG/GT:2673.3; IV Piggyback:1335.4] Out: 2150 [Urine:2150] Intake/Output from this shift: Total I/O In: 685.6 [I.V.:306.1; IV Piggyback:379.5] Out: 445 [Urine:445]  Labs: Recent Labs    04/28/18 0313 04/29/18 0302 04/29/18 1000 04/29/18 1725 04/30/18 0423  WBC 29.6* 17.5*  --   --  9.7  HGB 10.6* 9.3*  --   --  8.5*  HCT 33.1* 28.6*  --   --  26.3*  PLT 160 146*  --   --  126*  CREATININE 2.52* 2.05*  --  1.70* 1.52*  LABCREA  --   --  34  --   --   MG 1.8 2.0  --  1.9 2.1  PHOS 4.6  --   --   --   --   ALBUMIN 3.0* 2.6*  --   --  2.7*  PROT 5.8* 5.5*  --   --  5.9*  AST 74* 66*  --   --  52*  ALT 28 30  --   --  35  ALKPHOS 68 58  --   --  59  BILITOT 0.6 0.6  --   --  0.6   Estimated Creatinine Clearance: 44 mL/min (A) (by C-G formula based on SCr of 1.52 mg/dL (H)).   Assessment/Plan:  QT interval 450 on ECG 11/14. Fentanyl IV x 1 dose, Haloperidol IV q6h PRN were initiated on 11/14.   Drug-drug interactions identified: Haloperidol-fentanyl: CNS depressants; fentanyl was ordered as one-time dose. Haloperidol-amiodarone: QTc prolongation; monitor for now as haloperidol ordered as q6h PRN.   Continue current medications. Pharmacy will continue to monitor.   Thank you for allowing pharmacy to be a part of this patient's care.  Ocie Doyne, pharmacy student  04/30/2018,1:19 PM

## 2018-04-30 NOTE — Significant Event (Signed)
While receiving report from previous RN, Dr. Manuella Ghazi requested medications to reintubate patient d/t increased WOB. Versed and succinylcholine given. Dr.Shah intubated patient with no difficulties, pt had bilateral breath sounds post intubation. Pt hooked up to ventilator, VSS. Will continue to monitor

## 2018-04-30 NOTE — Progress Notes (Signed)
Patient ID: Matthew Zamora, male   DOB: Apr 09, 1941, 77 y.o.   MRN: [id number]  [facility] PROGRESS NOTE  WALLIE LAGRAND JME:[id number] DOB: Jul 23, 1940 DOA: 04/26/2018 PCP: Maryland Pink, MD  HPI/Subjective: Patient sleeping well on Precedex drip.  Yesterday was very agitated and received medications overnight.  Objective: Vitals:   04/30/18 1200 04/30/18 1300  BP: (!) 149/80 133/76  Pulse: 67 65  Resp: 20 (!) 23  Temp: 97.6 F (36.4 C)   SpO2: 99% 97%    Filed Weights   04/28/18 0500 04/29/18 0305 04/30/18 0500  Weight: 86.1 kg 83.4 kg 86 kg    ROS: Review of Systems  Unable to perform ROS: Acuity of condition   Exam: Physical Exam  Constitutional: He appears lethargic.  HENT:  Nose: No mucosal edema.  Mouth/Throat: No oropharyngeal exudate or posterior oropharyngeal edema.  Unable to look into mouth  Eyes: Conjunctivae and lids are normal.  Neck: No JVD present. Carotid bruit is not present. No edema present. No thyroid mass and no thyromegaly present.  Cardiovascular: Regular rhythm, S1 normal, S2 normal and normal heart sounds. Exam reveals no gallop.  No murmur heard. Pulses:      Dorsalis pedis pulses are 2+ on the right side, and 2+ on the left side.  Respiratory: No respiratory distress. He has decreased breath sounds in the right lower field and the left lower field. He has no wheezes. He has no rhonchi. He has no rales.  GI: Soft. Bowel sounds are normal. There is no tenderness.  Musculoskeletal:       Right ankle: He exhibits no swelling.       Left ankle: He exhibits no swelling.  Lymphadenopathy:    He has no cervical adenopathy.  Neurological: He appears lethargic.  Lethargic  Skin: Skin is warm. No rash noted. Nails show no clubbing.  Psychiatric:  Lethargic      Data Reviewed: Basic Metabolic Panel: Recent Labs  Lab 04/27/18 1125 04/27/18 1732 04/28/18 0313 04/29/18 0302 04/29/18 1725 04/30/18 0423  NA 133* 133* 137 140  142 143  K 2.9* 4.0 4.0 3.5 3.9 3.7  CL 104 101 103 104 104 106  CO2 18* 21* 24 26 25 26   GLUCOSE 297* 152* 122* 108* 132* 164*  BUN 18 25* 33* 33* 31* 33*  CREATININE 1.26* 1.81* 2.52* 2.05* 1.70* 1.52*  CALCIUM 5.1* 6.8* 8.2* 8.4* 8.6* 8.5*  MG 1.3*  1.2*  --  1.8 2.0 1.9 2.1  PHOS 2.4*  --  4.6  --   --   --    Liver Function Tests: Recent Labs  Lab 04/26/18 0030 04/28/18 0313 04/29/18 0302 04/30/18 0423  AST 24 74* 66* 52*  ALT 16 28 30  35  ALKPHOS 79 68 58 59  BILITOT 0.7 0.6 0.6 0.6  PROT 7.5 5.8* 5.5* 5.9*  ALBUMIN 4.2 3.0* 2.6* 2.7*   CBC: Recent Labs  Lab 04/26/18 0030 04/26/18 0447 04/27/18 0948 04/28/18 0313 04/29/18 0302 04/30/18 0423  WBC 13.2* 14.8* 20.9* 29.6* 17.5* 9.7  NEUTROABS 7.7  --   --   --  15.3* 9.0*  HGB 13.0 10.9* 9.4* 10.6* 9.3* 8.5*  HCT 40.5 35.4* 30.0* 33.1* 28.6* 26.3*  MCV 91.4 93.2 94.3 93.0 89.4 88.6  PLT 237 191 142* 160 146* 126*   Cardiac Enzymes: Recent Labs  Lab 04/26/18 0030 04/26/18 0447 04/27/18 1125 04/27/18 1732 04/28/18 0313  TROPONINI 0.03* 0.16* 0.43* 0.57* 0.44*   BNP (last 3 results) Recent  Labs    04/26/18 0030 04/27/18 1054 04/29/18 0302  BNP 717.0* 799.0* 889.0*    CBG: Recent Labs  Lab 04/29/18 1251 04/29/18 1746 04/29/18 2337 04/30/18 0553 04/30/18 1131  GLUCAP 135* 127* 144* 146* 133*    Recent Results (from the past 240 hour(s))  Blood culture (routine x 2)     Status: None (Preliminary result)   Collection Time: 04/26/18  1:54 AM  Result Value Ref Range Status   Specimen Description BLOOD LEFT FATTY CASTS  Final   Special Requests   Final    BOTTLES DRAWN AEROBIC AND ANAEROBIC Blood Culture adequate volume   Culture   Final    NO GROWTH 4 DAYS Performed at [facility], [address] [place], [place] [address]    Report Status PENDING  Incomplete  Blood culture (routine x 2)     Status: None (Preliminary result)   Collection Time: 04/26/18  1:55 AM  Result Value  Ref Range Status   Specimen Description BLOOD LEFT ASSIST CONTROL  Final   Special Requests   Final    BOTTLES DRAWN AEROBIC AND ANAEROBIC Blood Culture results may not be optimal due to an excessive volume of blood received in culture bottles   Culture   Final    NO GROWTH 4 DAYS Performed at [facility], [address] [place], [place] [address]    Report Status PENDING  Incomplete  MRSA PCR Screening     Status: None   Collection Time: 04/26/18  2:54 AM  Result Value Ref Range Status   MRSA by PCR NEGATIVE NEGATIVE Final    Comment:        The GeneXpert MRSA Assay (FDA approved for NASAL specimens only), is one component of a comprehensive MRSA colonization surveillance program. It is not intended to diagnose MRSA infection nor to guide or monitor treatment for MRSA infections. Performed at [facility], [address] [place], [place] [address]   Culture, respiratory (non-expectorated)     Status: None   Collection Time: 04/27/18 10:06 AM  Result Value Ref Range Status   Specimen Description   Final    TRACHEAL ASPIRATE Performed at [facility], [address], [place], [place] [address]    Special Requests   Final    NONE Performed at [facility], [address] [place], [place] [address]    Gram Stain   Final    FEW WBC PRESENT, PREDOMINANTLY PMN RARE GRAM POSITIVE RODS    Culture   Final    FEW Consistent with normal respiratory flora. Performed at [facility], [place] [address] [place], [place] [address]    Report Status 04/29/2018 FINAL  Final  CULTURE, BLOOD (ROUTINE X 2) w Reflex to ID Panel     Status: None (Preliminary result)   Collection Time: 04/28/18 11:52 AM  Result Value Ref Range Status   Specimen Description BLOOD LEFT HAND  Final   Special Requests   Final    BOTTLES DRAWN AEROBIC AND ANAEROBIC Blood Culture results may not be optimal due to an inadequate volume of blood received in culture  bottles   Culture   Final    NO GROWTH 2 DAYS Performed at [facility], [address], [place], [place] [address]    Report Status PENDING  Incomplete  Culture, blood (Routine X 2) w Reflex to ID Panel     Status: None (Preliminary result)   Collection Time: 04/28/18  1:47 PM  Result Value Ref Range Status   Specimen Description BLOOD BLOOD LEFT HAND  Final   Special Requests   Final    BOTTLES DRAWN AEROBIC AND ANAEROBIC Blood Culture adequate volume   Culture   Final    NO GROWTH 2 DAYS Performed at [facility], [address], [place], [place] [address]    Report Status PENDING  Incomplete     Studies: US Renal  Result Date: 04/29/2018 CLINICAL DATA:  Acute renal failure EXAM: RENAL / URINARY TRACT ULTRASOUND COMPLETE COMPARISON:  Abdominal CT 05/16/2015 FINDINGS: Right Kidney: Renal measurements: 10 x 4 x 4 cm = volume: 87 mL. Simple cyst in the lower pole measuring 21 mm. No hydronephrosis or solid mass. Left Kidney: Renal measurements: 10 x 4 x 4 cm = volume: 86 mL. Echogenicity within normal limits. No mass or hydronephrosis visualized. Bladder: Appears normal for degree of bladder distention. Incidental lobulated but benign appearing cysts in the right liver, known from abdominal CT in 2016. Small right pleural effusion. IMPRESSION: 1. No acute finding or hydronephrosis. 2. Symmetric renal size with 80-90 cc volume. Electronically Signed   By: Monte Fantasia M.D.   On: 04/29/2018 10:25   Dg Chest Port 1 View  Result Date: 04/30/2018 CLINICAL DATA:  Shortness of breath. EXAM: PORTABLE CHEST 1 VIEW COMPARISON:  04/29/2018 FINDINGS: Endotracheal and enteric tubes have been removed. Right PICC terminates over the lower SVC. An ICD remains in place. Aeration of the lung bases has improved, however there are persistent patchy bibasilar airspace opacities as well as hazy opacities suggesting persistent small pleural effusions. No pneumothorax is identified.  IMPRESSION: Interval extubation with improved aeration of the lung bases. Persistent small pleural effusions. Electronically Signed   By: Logan Bores M.D.   On: 04/30/2018 08:11   Dg Chest Port 1 View  Result Date: 04/29/2018 CLINICAL DATA:  Acute respiratory failure, shortness of breath, history of CHF, former smoker. EXAM: PORTABLE CHEST 1 VIEW COMPARISON:  Portable chest x-ray of April 28, 2018 FINDINGS: The endotracheal tube tip projects 3.1 cm above the carina. The lungs are reasonably well inflated. Patchy airspace opacities are present in the right mid and lower lung. The right hemidiaphragm is largely obscured and there is partial obscuration of the left hemidiaphragm. The esophagogastric tube tip in proximal port project below the GE junction. The cardiac silhouette is mildly enlarged but stable. The pulmonary vascularity is prominent centrally but there is no cephalization. The right-sided PICC line tip projects at the midportion of the SVC. The ICD is in stable position. IMPRESSION: Slightly improved appearance of the lungs with slightly decreased bibasilar densities. Persistent small bilateral pleural effusions and bibasilar atelectasis or pneumonia. Stable mild cardiac enlargement without pulmonary edema. The support tubes are in reasonable position. Electronically Signed   By: David  Martinique M.D.   On: 04/29/2018 08:21    Scheduled Meds: . aspirin  81 mg Per Tube Daily  . atorvastatin  40 mg Per Tube Daily  . chlorhexidine  15 mL Mouth Rinse BID  . clopidogrel  75 mg Per Tube Daily  . feeding supplement (PRO-STAT SUGAR FREE 64)  30 mL Per Tube TID  . fentaNYL (SUBLIMAZE) injection  12.5 mcg Intravenous Once  . insulin aspart  0-9 Units Subcutaneous Q6H  . ipratropium  0.5 mg Nebulization Q6H  . lipase/protease/amylase  12,000 Units Oral Q8H  . mouth rinse  15 mL Mouth Rinse q12n4p  . metoprolol tartrate  5 mg Intravenous  Q6H  . pantoprazole (PROTONIX) IV  40 mg Intravenous Daily   . sodium chloride flush  10-40 mL Intracatheter Q12H   Continuous Infusions: . amiodarone 30 mg/hr (04/30/18 1200)  . ampicillin-sulbactam (UNASYN) IV Stopped (04/30/18 0824)  . dexmedetomidine (PRECEDEX) IV infusion 0.8 mcg/kg/hr (04/30/18 1200)  . feeding supplement (VITAL 1.5 CAL) Stopped (04/27/18 1233)  . heparin 1,200 Units/hr (04/30/18 1200)  . phenylephrine (NEO-SYNEPHRINE) Adult infusion Stopped (04/29/18 1642)  . potassium chloride 10 mEq (04/30/18 1205)    Assessment/Plan:  1. Septic shock secondary to pneumonia.  Antibiotics were switched to Unasyn and vancomycin. 2. Acute hypoxic respiratory failure with respiratory acidosis.  Continue nasal cannula oxygen supplementation.  Watch respiratory status closely. 3. Atrial fibrillation with rapid ventricular response.  Echocardiogram showed an EF of 40 to 45%.  Continue amiodarone and heparin drip 4. Acute encephalopathy.  Need to watch closely in the ICU at this point.  Patient currently on Precedex drip.  Was resting comfortably when I saw him. 5. Elevated troponin secondary to demand ischemia with septic shock.  6. Acute kidney injury on chronic kidney disease stage III.  Creatinine continues to improve daily 7. Pancreatic insufficiency on pancreatic enzymes 8. Anemia of chronic disease 9. Hyperlipidemia unspecified on Lipitor 10. History of prostate cancer 11. History of chronic combined systolic diastolic congestive heart failure.  Code Status:     Code Status Orders  (From admission, onward)         Start     Ordered   04/26/18 0254  Full code  Continuous     04/26/18 0253        Code Status History    This patient has a current code status but no historical code status.     Family Communication: Spoke with family at bedside today Disposition Plan: To be determined  Consultants:  Critical care specialist  Antibiotics: -Unasyn -Vancomycin  Time spent: 26 minutes.  Case discussed with nursing  staff  [facility]

## 2018-04-30 NOTE — Progress Notes (Signed)
[place] for heparin dosing for this 3 YOM admitted on 11/10 with acute AMS/delirium and suspected pneumonia. Developed AFRVR on 11/11 requiring anticoagulation.  Indication: Anticoagulation-Afib   No Known Allergies  Patient Measurements: Height: 5\' 11"  (180.3 cm) Weight: 189 lb 9.5 oz (86 kg) IBW/kg (Calculated) : 75.3 Heparin Dosing Weight: 83.4 kg (actual BW)  Vital Signs: Temp: 95.9 F (35.5 C) (11/14 0531) BP: 132/64 (11/14 0500) Pulse Rate: 65 (11/14 0531)  Labs: Recent Labs    04/27/18 1125 04/27/18 1732 04/28/18 0313 04/29/18 0302 04/29/18 1725 04/29/18 2025 04/30/18 0423  HGB  --   --  10.6* 9.3*  --   --  8.5*  HCT  --   --  33.1* 28.6*  --   --  26.3*  PLT  --   --  160 146*  --   --  126*  LABPROT  --   --   --  14.3  --   --   --   INR  --   --   --  1.12  --   --   --   HEPARINUNFRC  --   --   --   --   --  0.49 0.48  CREATININE 1.26* 1.81* 2.52* 2.05* 1.70*  --  1.52*  TROPONINI 0.43* 0.57* 0.44*  --   --   --   --     Estimated Creatinine Clearance: 44 mL/min (A) (by C-G formula based on SCr of 1.52 mg/dL (H)).   Medical History: Past Medical History:  Diagnosis Date  . Abdominal aortic aneurysm (AAA) ([place]) 05/13/15   seen on ct scan  . 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 Anemia   . 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 CAD (coronary artery disease)   . Cataract cortical, senile   . CHF (congestive heart failure) ([place])   . Chronic hoarseness   . Exocrine pancreatic insufficiency   . H. pylori infection   . History of hepatitis   . Hyperlipidemia   . Hypertension   . 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 Prostate CA [facility])     Assessment: Patient will require anticoagulation for stroke prevention related to newly developed A-fib. CHA2DS2-VASc is at least 4 (age >63, HTN, CHF). Patient was given heparin 5000 Units SubQ Q8h. Last dose given 11/13 @ 0538.   Goal of Therapy:  Heparin level  goal = 0.3-0.7  Plan:  1113 2025 HL 0.49. Will continue heparin 1200 Units/hr and recheck confirmatory level in 8 hours. CBC with AM labs per protocol.   11/14 AM heparin level 0.48. Continue current regimen. Recheck heparin level and CBC with tomorrow AM labs.   Monitor heparin level and CBC/platelets daily.   Eloise Harman, PharmD, BCPS Clinical Pharmacist 04/30/2018 5:56 AM

## 2018-04-30 NOTE — Procedures (Signed)
ELECTROENCEPHALOGRAM REPORT   Patient: Matthew Zamora       Room #: IC17A-AA EEG No. ID: [id number] Age: 77 y.o.        Sex: male Referring Physician: Manuella Ghazi Report Date:  04/30/2018        Interpreting Physician: Alexis Goodell  History: Matthew Zamora is an 77 y.o. male with altered mental status  Medications:  Unasyn, ASA, Lipitor, Plavix, Insulin, Creon, Lopressor, Protonix, Amiodarone, Precedex, Heparin, Neosynephrine  Conditions of Recording:  This is a 21 channel routine scalp EEG performed with bipolar and monopolar montages arranged in accordance to the international 10/20 system of electrode placement. One channel was dedicated to EKG recording.  The patient is in the altered state.  Description:  The background activity is slow and poorly organized.  The dominant rhythm consists of a low voltage, polymorphic delta activity.  There is some intermixed theta and alpha activity noted as well.  This activity is continuous and diffusely distributed. No epileptiform activity is noted.    Hyperventilation and intermittent photic stimulation were not performed.   IMPRESSION: This EEG is characterized by slowing which is consistent with normal drowse.  Can not rule out the possibility of slowing related to general cerebral disturbance such as a metabolic encephalopathy.  Clinical correlation recommended.  No epileptiform activity is noted.     Alexis Goodell, MD Neurology [phone] 04/30/2018, 4:48 PM

## 2018-04-30 NOTE — Progress Notes (Signed)
eeg completed ° °

## 2018-04-30 NOTE — Consult Note (Signed)
Consultation Note Date: 04/30/2018   Patient Name: Matthew Zamora  DOB: [birth year]  MRN: [id number]  Age / Sex: 77 y.o., male  PCP: Matthew Pink, MD Referring Physician: Loletha Grayer, MD  Reason for Consultation: Establishing goals of care  HPI/Patient Profile: 77 y.o. male  admitted on 04/26/2018 from home with complaints of shortness of breath. He has a past medical history of hypertension, anemia, pacemaker, coronary artery disease, anemia, CKD stage 3, abdominal aortic aneurysm, Barrett esophagus, CHF, hyperlipidemia, and prostate cancer. EMS reported patient called 911 with complaints of shortness of breath and feeling hot. On their arrival patient's blood pressure was 190/100 and he was hypoxic in the the 80s. He was administered 0.4 mg of nitroglycerin. EMS attempted to place him on CPAP however he bacame agitated and uncooperative. During his ED course patient continued with agitation and combativeness requiring intubation for respiratory support. Blood pressure was 60/40. Lactic acid 1.4, BNP 717, Cr 1.85, troponin 0.03, ABG ph 7.25, pCO2 54, pO2 126, glucose 200, WBC 13.2. EKG showed sinus tachycardia with PVCs. Chest x-ray showed patchy left-sided airspace opacity may reflect worsening pneumonia or asymmetric pulmonary edema and underlying vascular congestion. Since admission patient was successfully extubated on 11/13 to nasal cannula, currently on Unasyn and Vancomycin for septic shock treatment. He is being seen by cardiology and continues on amiodarone and heparin drip for a-fib RVR. He continues with agitation and altered mental status requiring Zyprexa and Precedex. Head CT negative for acute abnormalities. Neurology is currently following and performing further work-up! Palliative Medicine team consulted for goals of care discussion.   Clinical Assessment and Goals of Care: I have reviewed  medical records including lab results, imaging, Epic notes, and MAR, received report from the bedside RN, and assessed the patient. I then met at the bedside with patient's son, Matthew Zamora and his girlfriend who is a Designer, jewellery to discuss diagnosis prognosis, Matthew Zamora, EOL wishes, disposition and options. Patient remains lethargic with altered mental status He is unable to engage in goals of care discussion.   I introduced Palliative Medicine as specialized medical care for people living with serious illness. It focuses on providing relief from the symptoms and stress of a serious illness. The goal is to improve quality of life for both the patient and the family.  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 He has 3 sons, and Matthew Zamora reports he is the only child that is really involved in his care consistently. Patient loves shopping and selling antiques.   Son reports prior to admission patient was totally independent. He performed all of his ADLs, was driving, and spending most of his time with his 99 year old girlfriend. Family shares a picture of patient and his girlfriend. Son states patient was very private and did not discus his life or concerns much with them. They are unaware of the girlfriends name or whereabouts. They report he had a dog for many years who he loved dearly and called his son. His dog passed away  several weeks prior to admission and Matthew Zamora reports this devastated the patient. They were best friends. Family reports patient had a great appetite and would eat full meals with snacks throughout the day. They are unaware if he was caring for himself properly as they did not ask him about his personal business, attend appointments with him, or follow up on his medications.   Son reports patient was bit by a spider several weeks ago (4-6) and had several weeks of hallucinations where he would see things and spiders. He never received any medical attention and family  states hallucinations went away as far as they know or he did not continue to complain about them or show behaviors.   We discussed his current illness and what it means in the larger context of his on-going co-morbidities.  Natural disease trajectory and expectations at EOL were discussed. Son's girlfriend expressed that family remains hopeful for improvement. They feel he is halfway back to baseline despite continuous agitation and obvious confusion. Girlfriend states "he is probably agitated because he doesn't like hospitals and Matthew Zamora. He only likes Matthew Zamora and if he was able would probably get up and leave and go there." I attempted to discuss patients condition and that patient's altered mental status most likely was not related to patient not wanting to be hospitalized. Family verbalized understanding and remain hopeful for further answers after neurology work-up.   I attempted to elicit values and goals of care important to the patient and his family. Son states he is unsure of his father's wishes in regards to how aggressive he would want his medical care to be. He remains hopeful for improvement in his mental status to allow the opportunity for them to discuss his wishes. He reports he would not even know where to start in regards to his finances or health care needs.   He reports he would not want to make any major decisions until allowing his father additional time to recover. He reports to continue with all aggressive medical interventions including reintubation and CPR if required. I discussed with family that hoping for the best is not unreasonable however preparing for the worst should also be apart of the planning and discussions in the event patient's mental status does not improve and he is unable to express his wishes or make medical decisions.   Son somewhat tearful and states he has not thought that far and guess he would have to attempt to get along with his brothers to  make decisions, as he know it would be a joint decision. Family reports patient has always told them he had insurance for care to be given in the home and would not ever want to go to a nursing home and he also has informed him that his funeral arrangements are all prepaid.   I attempted to discuss in detail patient's current full code status with respect to his current illness and co-morbidities. Girlfriend verbalized understanding and asked questions regarding if patient was a DNR the level of care he would receive. Educated family if patient was made a DNR/DNI all care would continue and the only difference would be if patient experienced a cardiac or respiratory event no life-sustaining measures would be taken such as CPR, defibrillation, ACLS medications, or intubation would be performed. Patient would be allowed to pass away naturally. Family verbalized understanding and appreciation.   Son again, states he has to allow his father time and would like to continue taking it day  by day. He wishes to continue with full code with hopes his father will show signs of improvement and can have a discussion in regards to his wishes and complete advance directives.   Hospice and Palliative Care services outpatient were explained and offered. Given family expressed wishes to continue to treat the treatable with aggressive measures, patient would be most appropriate for outpatient palliative at minimum. However, family educated that this will depend on how he improves or does not improve and further goals of care. Family verbalized understanding and request for palliative to continue to support patient and family during hospitalization. Family aware our team will continue to support and offer guidance and answers as needed. Son verbalized understanding and appreciation.   Questions and concerns were addressed. The family was encouraged to call with questions or concerns.  PMT will continue to support  holistically.   Primary Decision Maker NEXT OF KIN  SUMMARY OF RECOMMENDATIONS    Full Code-as requested by family   Continue to treat the treatable including aggressive measures.   Family remains hopeful patient will show signs of improvement and can engage in discussion regarding his wishes and further level of care.   Palliative Medicine team will continue to support patient, family, and medical team during hospitalization.   Code Status/Advance Care Planning:  Full code  Palliative Prophylaxis:   Aspiration, Bowel Regimen, Delirium Protocol, Frequent Pain Assessment, Oral Care and Turn Reposition  Additional Recommendations (Limitations, Scope, Preferences):  Full Scope Treatment-continue to treat the treatable with aggressive measures.   Psycho-social/Spiritual:   Desire for further Chaplaincy support:NO   Prognosis:   Unable to determine-Guarded in the setting of altered mental status requiring Precedex and Zyprexa, deconditioning, acute respiratory failure requiring intubation, s/p extubation with nasal cannula support, hypertension, anemia, pacemaker, coronary artery disease, anemia, CKD stage 3, abdominal aortic aneurysm, Barrett esophagus, CHF, hyperlipidemia, and prostate cancer.  Discharge Planning: To Be Determined      Primary Diagnoses: Present on Admission: . Acute respiratory failure ([place])   I have reviewed the medical record, interviewed the patient and family, and examined the patient. The following aspects are pertinent.  Past Medical History:  Diagnosis Date  . Abdominal aortic aneurysm (AAA) ([place]) 05/13/15   seen on ct scan  . 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 Anemia   . 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 CAD (coronary artery disease)   . Cataract cortical, senile   . CHF (congestive heart failure) ([place])   . Chronic hoarseness   . Exocrine pancreatic insufficiency   . H. pylori infection   . History of  hepatitis   . Hyperlipidemia   . Hypertension   . 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 Prostate CA [facility])    Social History   Socioeconomic History  . Marital status: Single    Spouse name: Not on file  . Number of children: Not on file  . Years of education: Not on file  . Highest education level: Not on file  Occupational History  . Not on file  Social Needs  . Financial resource strain: Not on file  . Food insecurity:    Worry: Not on file    Inability: Not on file  . Transportation needs:    Medical: Not on file    Non-medical: Not on file  Tobacco Use  . Smoking status: Former Smoker    Packs/day: 1.00    Years: 38.00    Pack years: 38.00    Types: Cigarettes  Last attempt to quit: 06/18/1999    Years since quitting: 18.8  . Smokeless tobacco: Never Used  Substance and Sexual Activity  . Alcohol use: No    Alcohol/week: 0.0 standard drinks  . Drug use: Not on file  . Sexual activity: Not on file  Lifestyle  . Physical activity:    Days per week: Not on file    Minutes per session: Not on file  . Stress: Not on file  Relationships  . Social connections:    Talks on phone: Not on file    Gets together: Not on file    Attends religious service: Not on file    Active member of club or organization: Not on file    Attends meetings of clubs or organizations: Not on file    Relationship status: Not on file  Other Topics Concern  . Not on file  Social History Narrative  . Not on file   Family History  Problem Relation Age of Onset  . Heart attack Mother   . Heart attack Father    Scheduled Meds: . aspirin  81 mg Per Tube Daily  . atorvastatin  40 mg Per Tube Daily  . chlorhexidine  15 mL Mouth Rinse BID  . clopidogrel  75 mg Per Tube Daily  . insulin aspart  0-9 Units Subcutaneous Q6H  . ipratropium  0.5 mg Nebulization Q6H  . lipase/protease/amylase  12,000 Units Oral Q8H  . mouth rinse  15 mL Mouth Rinse q12n4p  . metoprolol tartrate  5 mg Intravenous Q6H  .  pantoprazole (PROTONIX) IV  40 mg Intravenous Daily  . sodium chloride flush  10-40 mL Intracatheter Q12H   Continuous Infusions: . amiodarone 30 mg/hr (04/30/18 1200)  . ampicillin-sulbactam (UNASYN) IV Stopped (04/30/18 0824)  . dexmedetomidine (PRECEDEX) IV infusion 0.8 mcg/kg/hr (04/30/18 1200)  . heparin 1,200 Units/hr (04/30/18 1200)  . phenylephrine (NEO-SYNEPHRINE) Adult infusion Stopped (04/29/18 1642)   PRN Meds:.acetaminophen **OR** acetaminophen, bisacodyl, bisacodyl, haloperidol lactate, ondansetron **OR** ondansetron (ZOFRAN) IV, sennosides, sodium chloride flush Medications Prior to Admission:  Prior to Admission medications   Medication Sig Start Date End Date Taking? Authorizing Provider  aspirin EC 81 MG tablet Take 81 mg by mouth daily.   Yes [provider]  atorvastatin (LIPITOR) 40 MG tablet Take 40 mg by mouth daily.   Yes [provider]  clopidogrel (PLAVIX) 75 MG tablet Take 75 mg by mouth daily. 04/15/18  Yes [provider]  furosemide (LASIX) 40 MG tablet Take 20 mg by mouth daily.   Yes [provider]  lisinopril (PRINIVIL,ZESTRIL) 5 MG tablet Take 5 mg by mouth daily.   Yes [provider]  magnesium (MAGTAB) 84 MG (7MEQ) TBCR SR tablet Take 84 mg by mouth daily.   Yes [provider]  Melatonin 1 MG TABS Take 1 tablet by mouth at bedtime as needed. 12/27/13  Yes [provider]  metoprolol succinate (TOPROL-XL) 100 MG 24 hr tablet Take 100 mg by mouth daily. Take with or immediately following a meal.   Yes [provider]  Multiple Vitamins-Minerals (MULTIVITAMIN WITH MINERALS) tablet Take 1 tablet by mouth daily.   Yes [provider]  omeprazole (PRILOSEC) 20 MG capsule Take 20 mg by mouth daily.   Yes [provider]  oxybutynin (DITROPAN XL) 15 MG 24 hr tablet Take 15 mg by mouth at bedtime.   Yes [provider]  lipase/protease/amylase (CREON) 12000 units  CPEP capsule Take 24,000 Units by  mouth 2 (two) times daily at 10 am and 4 pm.    [provider]   No Known Allergies Review of Systems  Unable to perform ROS: Mental status change    Physical Exam  Constitutional: Vital signs are normal. He appears lethargic. He is uncooperative. He appears ill.  Thin, frail, chronically ill appearing   Cardiovascular: Normal rate, regular rhythm, normal heart sounds and normal pulses.  Pulmonary/Chest: Effort normal. He has decreased breath sounds.  2L/[place]  Abdominal: Soft. Normal appearance and bowel sounds are normal.  Neurological: He appears lethargic. He is disoriented.  Agitation at times   Skin: Skin is warm, dry and intact. Bruising noted.  Psychiatric: Cognition and memory are impaired. He expresses inappropriate judgment.  Nursing note and vitals reviewed.   Vital Signs: BP 133/76   Pulse 65   Temp 97.6 F (36.4 C) (Axillary)   Resp 15   Ht _0  (1.803 m)   Wt 86 kg   SpO2 100%   BMI 26.44 kg/m  Pain Scale: CPOT POSS *See Group Information*: 2-Acceptable,Slightly drowsy, easily aroused Pain Score: 0-No pain   SpO2: SpO2: 100 % O2 Device:SpO2: 100 % O2 Flow Rate: .O2 Flow Rate (L/min): 2 L/min  IO: Intake/output summary:   Intake/Output Summary (Last 24 hours) at 04/30/2018 1543 Last data filed at 04/30/2018 1200 Gross per 24 hour  Intake 5128.44 ml  Output 1745 ml  Net 3383.44 ml    LBM: Last BM Date: 04/29/18 Baseline Weight: Weight: 81 kg Most recent weight: Weight: 86 kg     Palliative Assessment/Data: PRECEDEX, NPO, Pending SLP input     Time In: 1400 Time Out: 1515 Time Total: 75 min  Greater than 50%  of this time was spent counseling and coordinating care related to the above assessment and plan.  Signed by:  Alda Lea, AGPCNP-BC Palliative Medicine Team  Phone: [phone] Fax: [phone] Pager: [phone] Amion: Bjorn Pippin    Please contact Palliative Medicine  Team phone at [phone] for questions and concerns.  For individual provider: See Shea Evans

## 2018-05-01 ENCOUNTER — Inpatient Hospital Stay: Payer: Medicare Other

## 2018-05-01 DIAGNOSIS — I480 Paroxysmal atrial fibrillation: Secondary | ICD-10-CM

## 2018-05-01 LAB — BLOOD GAS, ARTERIAL
ACID-BASE EXCESS: 3.2 mmol/L — AB (ref 0.0–2.0)
Bicarbonate: 27.9 mmol/L (ref 20.0–28.0)
FIO2: 0.3
MECHVT: 450 mL
O2 SAT: 97.4 %
PCO2 ART: 42 mmHg (ref 32.0–48.0)
PEEP: 5 cmH2O
PH ART: 7.43 (ref 7.350–7.450)
Patient temperature: 37
RATE: 18 resp/min
pO2, Arterial: 93 mmHg (ref 83.0–108.0)

## 2018-05-01 LAB — BASIC METABOLIC PANEL
ANION GAP: 14 (ref 5–15)
ANION GAP: 10 (ref 5–15)
BUN: 39 mg/dL — ABNORMAL HIGH (ref 8–23)
BUN: 40 mg/dL — ABNORMAL HIGH (ref 8–23)
CALCIUM: 8.2 mg/dL — AB (ref 8.9–10.3)
CHLORIDE: 102 mmol/L (ref 98–111)
CO2: 27 mmol/L (ref 22–32)
CO2: 28 mmol/L (ref 22–32)
Calcium: 8.1 mg/dL — ABNORMAL LOW (ref 8.9–10.3)
Chloride: 100 mmol/L (ref 98–111)
Creatinine, Ser: 1.55 mg/dL — ABNORMAL HIGH (ref 0.61–1.24)
Creatinine, Ser: 1.67 mg/dL — ABNORMAL HIGH (ref 0.61–1.24)
GFR calc Af Amer: 48 mL/min — ABNORMAL LOW (ref >60)
GFR calc non Af Amer: 38 mL/min — ABNORMAL LOW (ref >60)
GFR calc non Af Amer: 42 mL/min — ABNORMAL LOW (ref >60)
GFR, EST AFRICAN AMERICAN: 44 mL/min — AB (ref >60)
GLUCOSE: 251 mg/dL — AB (ref 70–99)
Glucose, Bld: 213 mg/dL — ABNORMAL HIGH (ref 70–99)
POTASSIUM: 3.4 mmol/L — AB (ref 3.5–5.1)
Potassium: 3.6 mmol/L (ref 3.5–5.1)
Sodium: 140 mmol/L (ref 135–145)
Sodium: 141 mmol/L (ref 135–145)

## 2018-05-01 LAB — GLUCOSE, CAPILLARY
GLUCOSE-CAPILLARY: 100 mg/dL — AB (ref 70–99)
GLUCOSE-CAPILLARY: 105 mg/dL — AB (ref 70–99)
GLUCOSE-CAPILLARY: 116 mg/dL — AB (ref 70–99)
GLUCOSE-CAPILLARY: 148 mg/dL — AB (ref 70–99)
GLUCOSE-CAPILLARY: 181 mg/dL — AB (ref 70–99)
GLUCOSE-CAPILLARY: 360 mg/dL — AB (ref 70–99)
Glucose-Capillary: 108 mg/dL — ABNORMAL HIGH (ref 70–99)
Glucose-Capillary: 133 mg/dL — ABNORMAL HIGH (ref 70–99)
Glucose-Capillary: 144 mg/dL — ABNORMAL HIGH (ref 70–99)
Glucose-Capillary: 156 mg/dL — ABNORMAL HIGH (ref 70–99)
Glucose-Capillary: 161 mg/dL — ABNORMAL HIGH (ref 70–99)
Glucose-Capillary: 270 mg/dL — ABNORMAL HIGH (ref 70–99)

## 2018-05-01 LAB — CBC
HCT: 25 % — ABNORMAL LOW (ref 39.0–52.0)
HEMATOCRIT: 26.8 % — AB (ref 39.0–52.0)
Hemoglobin: 8.2 g/dL — ABNORMAL LOW (ref 13.0–17.0)
Hemoglobin: 9 g/dL — ABNORMAL LOW (ref 13.0–17.0)
MCH: 29.6 pg (ref 26.0–34.0)
MCH: 30.2 pg (ref 26.0–34.0)
MCHC: 32.8 g/dL (ref 30.0–36.0)
MCHC: 33.6 g/dL (ref 30.0–36.0)
MCV: 89.9 fL (ref 80.0–100.0)
MCV: 90.3 fL (ref 80.0–100.0)
NRBC: 0 % (ref 0.0–0.2)
PLATELETS: 124 10*3/uL — AB (ref 150–400)
Platelets: 132 10*3/uL — ABNORMAL LOW (ref 150–400)
RBC: 2.77 MIL/uL — ABNORMAL LOW (ref 4.22–5.81)
RBC: 2.98 MIL/uL — AB (ref 4.22–5.81)
RDW: 15 % (ref 11.5–15.5)
RDW: 14.9 % (ref 11.5–15.5)
WBC: 10.4 10*3/uL (ref 4.0–10.5)
WBC: 10.8 10*3/uL — AB (ref 4.0–10.5)
nRBC: 0 % (ref 0.0–0.2)

## 2018-05-01 LAB — HEPARIN LEVEL (UNFRACTIONATED)
HEPARIN UNFRACTIONATED: 0.28 [IU]/mL — AB (ref 0.30–0.70)
Heparin Unfractionated: 1.36 IU/mL — ABNORMAL HIGH (ref 0.30–0.70)

## 2018-05-01 LAB — PHOSPHORUS
Phosphorus: 3.5 mg/dL (ref 2.5–4.6)
Phosphorus: 2.8 mg/dL (ref 2.5–4.6)

## 2018-05-01 LAB — CULTURE, BLOOD (ROUTINE X 2)
CULTURE: NO GROWTH
Culture: NO GROWTH
SPECIAL REQUESTS: ADEQUATE

## 2018-05-01 LAB — MAGNESIUM
MAGNESIUM: 2.3 mg/dL (ref 1.7–2.4)
Magnesium: 2.3 mg/dL (ref 1.7–2.4)
Magnesium: 2.4 mg/dL (ref 1.7–2.4)

## 2018-05-01 MED ORDER — ORAL CARE MOUTH RINSE
15.0000 mL | OROMUCOSAL | Status: DC
  Administered 2018-05-01 – 2018-05-05 (×42): 15 mL via OROMUCOSAL

## 2018-05-01 MED ORDER — FUROSEMIDE 10 MG/ML IJ SOLN
40.0000 mg | Freq: Two times a day (BID) | INTRAMUSCULAR | Status: DC
  Administered 2018-05-01 – 2018-05-05 (×8): 40 mg via INTRAVENOUS
  Filled 2018-05-01 (×8): qty 4

## 2018-05-01 MED ORDER — INSULIN ASPART 100 UNIT/ML ~~LOC~~ SOLN
0.0000 [IU] | SUBCUTANEOUS | Status: DC
  Administered 2018-05-01 – 2018-05-02 (×4): 2 [IU] via SUBCUTANEOUS
  Administered 2018-05-02: 1 [IU] via SUBCUTANEOUS
  Administered 2018-05-02: 3 [IU] via SUBCUTANEOUS
  Administered 2018-05-02: 2 [IU] via SUBCUTANEOUS
  Administered 2018-05-02: 3 [IU] via SUBCUTANEOUS
  Administered 2018-05-03 (×2): 2 [IU] via SUBCUTANEOUS
  Administered 2018-05-03: 3 [IU] via SUBCUTANEOUS
  Administered 2018-05-03 – 2018-05-04 (×4): 2 [IU] via SUBCUTANEOUS
  Administered 2018-05-04: 1 [IU] via SUBCUTANEOUS
  Administered 2018-05-04: 2 [IU] via SUBCUTANEOUS
  Administered 2018-05-04: 3 [IU] via SUBCUTANEOUS
  Administered 2018-05-04 – 2018-05-05 (×2): 1 [IU] via SUBCUTANEOUS
  Administered 2018-05-05: 2 [IU] via SUBCUTANEOUS
  Administered 2018-05-05 – 2018-05-06 (×3): 1 [IU] via SUBCUTANEOUS
  Filled 2018-05-01 (×24): qty 1

## 2018-05-01 MED ORDER — CHLORHEXIDINE GLUCONATE 0.12% ORAL RINSE (MEDLINE KIT)
15.0000 mL | Freq: Two times a day (BID) | OROMUCOSAL | Status: DC
  Administered 2018-05-01 – 2018-05-05 (×10): 15 mL via OROMUCOSAL

## 2018-05-01 MED ORDER — POTASSIUM CHLORIDE 20 MEQ PO PACK
40.0000 meq | PACK | Freq: Once | ORAL | Status: AC
  Administered 2018-05-01: 40 meq
  Filled 2018-05-01: qty 2

## 2018-05-01 MED ORDER — VITAL HIGH PROTEIN PO LIQD
1000.0000 mL | ORAL | Status: DC
  Administered 2018-05-01 – 2018-05-04 (×4): 1000 mL

## 2018-05-01 MED ORDER — HEPARIN (PORCINE) 25000 UT/250ML-% IV SOLN
900.0000 [IU]/h | INTRAVENOUS | Status: DC
  Administered 2018-05-02: 1050 [IU]/h via INTRAVENOUS
  Administered 2018-05-03 – 2018-05-04 (×2): 900 [IU]/h via INTRAVENOUS
  Filled 2018-05-01 (×3): qty 250

## 2018-05-01 MED ORDER — FUROSEMIDE 10 MG/ML IJ SOLN
40.0000 mg | Freq: Once | INTRAMUSCULAR | Status: AC
  Administered 2018-05-01: 40 mg via INTRAVENOUS
  Filled 2018-05-01: qty 4

## 2018-05-01 NOTE — Procedures (Signed)
   Endotracheal  Intubation Procedure Note  Performed By:  Sarena Jezek, MD   Assistant:   RN, RT  Medications given were:   Vacuronicum 50 and Versed 4   Preoxygenated with BVM ventilation   Oral cavity was cleared;  Mac blade with camera was used to visualize the vocal cords;  cords moved symmetrically.  A number  7.5   cuffed ETT was used to intubate the trachea;  ETT secured at : 25 cm at the teeth.    Placement was evaluated by noting: bilateral, symmetric breath sounds,  good end-tidal CO2 detector color change to yellow ,  Equal breath sounds bilaterally over chest;   no breath sounds over stomach and  chest x-ray visualization.    Attempts required:  1  Complications: none.    The procedure was tolerated well  Estimated Blood Loss:                     cxr- post intubation: Tube in good position   Amia Rynders, MD INTENSIVIST-PULMONOLOGIST  

## 2018-05-01 NOTE — Progress Notes (Signed)
Pharmacy Antibiotic Note  Matthew Zamora is a 77 y.o. male admitted on 04/26/2018 with respiratory distress. Patient was admitted via EMS from home. Patient was intubated and chest x-ray is positive for pneumonia. WBC went down to WNL and patient remained afebrile on 11/14. Cultures have been negative so far. Pharmacy has been consulted for Unasyn dosing.  Plan: Continue Unasyn 3 g IV Q6h.   Height: 5\' 11"  (180.3 cm) Weight: 189 lb 9.5 oz (86 kg) IBW/kg (Calculated) : 75.3  Temp (24hrs), Avg:97.6 F (36.4 C), Min:97 F (36.1 C), Max:98.2 F (36.8 C)  Recent Labs  Lab 04/26/18 0154 04/26/18 0447  04/28/18 0313 04/29/18 0302 04/29/18 1725 04/30/18 0423 04/30/18 2359 05/01/18 0359  WBC  --  14.8*   < > 29.6* 17.5*  --  9.7 10.8* 10.4  CREATININE  --  1.73*   < > 2.52* 2.05* 1.70* 1.52* 1.67* 1.55*  LATICACIDVEN 1.4 1.6  --   --   --   --   --   --   --    < > = values in this interval not displayed.    Estimated Creatinine Clearance: 43.2 mL/min (A) (by C-G formula based on SCr of 1.55 mg/dL (H)).    No Known Allergies  Antimicrobials this admission: Unasyn 11/12 >> Vancomycin 11/12 >> 11/14 Azithromycin 11/10 >> 11/12 Ceftriaxone 11/10 >> 11/12  Dose adjustments this admission: 11/13 Unasyn 3g Q12h >>> Unasyn 3g Q6h  Microbiology results: 11/10 BCx: negative  11/11 Sputum: normal flora 11/10 MRSA PCR: negative 11/12 BCx: NG x 3 days   Thank you for allowing pharmacy to be a part of this patient's care.  Ocie Doyne, PharmD candidate  05/01/2018 10:34 AM

## 2018-05-01 NOTE — Progress Notes (Signed)
PULMONARY / CRITICAL CARE MEDICINE   NAME:  Matthew Zamora, MRN:  [id number], DOB:  [birth year], LOS: 5 ADMISSION DATE:  04/26/2018   PT PROFILE:    40 M adm via ED with acute AMS/delirium, hypoxemia, tachypnea, labile BP, leukocytosis, suspected pneumonia.  Developed AFRVR on day following admission.  05/01/2018: -Patient was tachypneic, hypoxic, hypercarbic last night-possibly after having some ice chips questionable aspiration, was wheezing a lot on exam and patient was reintubated and placed on ventilator -Creatinine improved with diuresis still 10 L positive -Head CT was done  did not reveal any acute abnormality-neurology has been consulted the plan to repeat another head CT - Patient is currently intubated and sedated -Cardiology consult appreciated-appreciate input currently on amiodarone drip -Currently Off Vanco , Continue on  Unasyn, WBC is improving, creatinine is improving  SIGNIFICANT STUDIES/EVENTS:  11/10 Echcoardiogram: LVEF 40-45%.  Mild MR.  LA moderately dilated.  RVSP estimate 50 mmHg 11/11 Developed AFRVR. NE changed to PE. Amiodarone infusion intitiated.  11/12 Severe intermittent agitation. Propofol initiated, ABG improved with vent adjustment, head CT done negative 11/13: Doing okay, last night went into A. fib with slightly higher rate in 100s, increase amnio dose, SBT and extubated agitation required Precedex 11/14: Doing well, ABG and everything else acceptable, slight drop in platelet to 126, neurology consult requested because of ongoing agitation 1115 patient was reintubated last night because of possible aspiration, doing well ABG and all other numbers acceptable neurology planning for another head CT   CULTURES:  MRSA PCR 11/10 >> NEG Resp 11/11 >> G PR Blood 11/10 >>  Blood: 04/28/2018 Urine strep Ag 11/11 >>  Urine Legionella Ag 11/11 >>   ANTIBIOTICS:  Azithromycin 11/10 >> stopped at 04/28/2018 Ceftriaxone 11/10 >> stopped at  04/28/2018 Vancomycin: 04/28/2018-04/30/2018 Unasyn: 04/28/2018-    LINES/TUBES:  ETT 11/10 >>  RUE PICC 11/10 >>   CONSULTANTS:  Nephrology Cardiology Neurology   SUBJECTIVE:  Intermittent agitation.  When calm, synchronous with mechanical ventilation.  Not F/C.  CONSTITUTIONAL: BP 108/65   Pulse (!) 105   Temp (!) 97 F (36.1 C) (Oral)   Resp 14   Ht 5\' 11"  (1.803 m)   Wt 86 kg   SpO2 100%   BMI 26.44 kg/m   I/O last 3 completed shifts: In: 6500.6 [I.V.:2221.3; NG/GT:2673.3; IV Piggyback:1606] Out: 2255 [Urine:2255]  PHYSICAL EXAM: General: Currently intubated and sedated Neuro: Drowsy but arousable, moves all extremities HEENT: NCAT, sclerae white Cardiovascular: IRIR, tachy, no M noted Lungs: Scattered bilateral rhonchi, no wheezes Abdomen: soft, NT, + BS Ext: warm, minimal edema Skin: No lesions noted  BMP Latest Ref Rng & Units 05/01/2018 04/30/2018 04/30/2018  Glucose 70 - 99 mg/dL 213(H) 251(H) 164(H)  BUN 8 - 23 mg/dL 40(H) 39(H) 33(H)  Creatinine 0.61 - 1.24 mg/dL 1.55(H) 1.67(H) 1.52(H)  Sodium 135 - 145 mmol/L 141 140 143  Potassium 3.5 - 5.1 mmol/L 3.4(L) 3.6 3.7  Chloride 98 - 111 mmol/L 100 102 106  CO2 22 - 32 mmol/L 27 28 26   Calcium 8.9 - 10.3 mg/dL 8.2(L) 8.1(L) 8.5(L)   CBC Latest Ref Rng & Units 05/01/2018 04/30/2018 04/30/2018  WBC 4.0 - 10.5 K/uL 10.4 10.8(H) 9.7  Hemoglobin 13.0 - 17.0 g/dL 9.0(L) 8.2(L) 8.5(L)  Hematocrit 39.0 - 52.0 % 26.8(L) 25.0(L) 26.3(L)  Platelets 150 - 400 K/uL 132(L) 124(L) 126(L)    Cardiac Panel (last 3 results) No results for input(s): CKTOTAL, CKMB, TROPONINI, RELINDX in the last 72 hours. BNP  Component Value Date/Time   BNP 889.0 (H) 04/29/2018 0302    LABS  Glucose Recent Labs  Lab 05/01/18 0006 05/01/18 0010 05/01/18 0406 05/01/18 0409 05/01/18 0455 05/01/18 0732  GLUCAP 133* 181* 360* 100* 116* 105*    BMET Recent Labs  Lab 04/30/18 0423 04/30/18 2359 05/01/18 0359  NA  143 140 141  K 3.7 3.6 3.4*  CL 106 102 100  CO2 26 28 27   BUN 33* 39* 40*  CREATININE 1.52* 1.67* 1.55*  GLUCOSE 164* 251* 213*    Liver Enzymes Recent Labs  Lab 04/28/18 0313 04/29/18 0302 04/30/18 0423  AST 74* 66* 52*  ALT 28 30 35  ALKPHOS 68 58 59  BILITOT 0.6 0.6 0.6  ALBUMIN 3.0* 2.6* 2.7*    Electrolytes Recent Labs  Lab 04/27/18 1125  04/28/18 0313 04/29/18 0302 04/29/18 1725 04/30/18 0423 04/30/18 2359 05/01/18 0359  CALCIUM 5.1*   < > 8.2* 8.4* 8.6* 8.5* 8.1* 8.2*  MG 1.3*  1.2*  --  1.8 2.0 1.9 2.1  --   --   PHOS 2.4*  --  4.6  --   --   --   --   --    < > = values in this interval not displayed.    CBC Recent Labs  Lab 04/30/18 0423 04/30/18 2359 05/01/18 0359  WBC 9.7 10.8* 10.4  HGB 8.5* 8.2* 9.0*  HCT 26.3* 25.0* 26.8*  PLT 126* 124* 132*    ABG Recent Labs  Lab 04/30/18 1846 04/30/18 2025 05/01/18 0457  PHART 7.24* 7.31* 7.43  PCO2ART 64* 55* 42  PO2ART 62* 113* 93    Coag's Recent Labs  Lab 04/26/18 0030 04/29/18 0302  APTT 29  --   INR 0.99 1.12    Sepsis Markers Recent Labs  Lab 04/26/18 0030 04/26/18 0154 04/26/18 0447  LATICACIDVEN  --  1.4 1.6  PROCALCITON <0.10  --   --     Cardiac Enzymes Recent Labs  Lab 04/27/18 1125 04/27/18 1732 04/28/18 0313  TROPONINI 0.43* 0.57* 0.44*    ASSESSMENT AND PLAN   Acute ventilator dependent respiratory failure with hypoxemia-liberated from ventilator on 04/29/2018, intubated on 04/30/2018 possibly due to aspiration-had some stridor after initially and to extubation so will require leak test and monitoring before considering further liberation of ventilation Suspected community-acquired pneumonia treated with Rocephin and Zithromax WBC was going up now on Unasyn doing well Suspected pulmonary edema-about 9 to 10 L positive on and off received Lasix will give another dose today Hypotension improved off pressors Paroxysmal atrial fibrillation-cardiology evaluated  currently on amiodarone drip and heparin drip AKI, nonoliguric-improvement noted-creatinine has normalized to 1.55 Severe sepsis, septic shock improvement noted Acute encephalopathy with intermittent agitation-questionable etiology, currently on Precedex we will plan to taper it down, EEG done did not reveal any significant seizure activity just showed generalized slowing, neurology is following plan to repeat another head CT today  PLAN:  QMG:[id number] changed to phenylephrine 11/11- currently off Amiodarone infusion initiated 11/11 for A. fib-dose was increased on 1113, defer to cardiology Give large dose of Lasix-Lasix 60 ordered today EF is 40 to 45%, history of AICD by Dr. Luana Shu in the past, mild leak of troponin will continue with aspirin/Plavix-recent RCA stent- cardiology is currently following, currently on heparin drip for A. fib, drop in platelets noted will monitor RS: Patient was reintubated on 04/30/2018: Doing well - Bronchodilators avoid albuterol -- Aspiration precaution --Atrovent -Continue with the bronchodilators avoid albuterol due to A.  fib ID: Will DC vancomycin today we will continue with Unasyn, adjust with culture ENDO: TSH is ok -- SSI monitor blood sugar GI: Speech evaluation and can start diet if able to tolerate it, PPI RENAL: Renal consult is called, see response with large dose of Lasix -- Electrolyte replacement protocol, Monitor Cr and K  CNS: Neurology following, repeat head CT plan, will defer further management to them, continue with sedation taper as tolerated HEMATOLOGY: Monitor Hb, Drop in PLT noted on heparin will monitor closely -- Monitor HB and PLT MUSCULOSKELETAL: No acute issues PAIN AND SEDATION: Currently on Precedex plan to taper it down  Skin/Wound: Chronic changes  Electrolytes: Replace electrolytes per ICU electrolyte replacement protocol.   IVF: none  Nutrition: Tube feeds as tolerated  Prophylaxis: DVT Prophylaxis with  heparin,. GI Prophylaxis.   Restraints: Soft limb as he is trying to pull the tube on the case regardless  PT/OT eval and treat. OOB when appropriate.   Lines/Tubes: 04/26/2018 Foley pick 04/26/2018 WOR central line. ADVANCE DIRECTIVE: Full code- palliative is following with the family FAMILY DISCUSSION: Spoke with son and daughter-in-law on multiple occasion at length Quality Care: PPI, DVT prophylaxis, HOB elevated, Infection control all reviewed and addressed. Events and notes from last 24 hours reviewed. Care plan discussed on multidisciplinary rounds CC TIME: 34 minutes     Old records reviewed discussed results and management plan with patient  Images personally reviewed and results and labs reviewed and discussed with patient.  All medication reviewed and adjusted  Further management depending on test results and work up as outlined above.   Lahoma Rocker, M.D

## 2018-05-01 NOTE — Progress Notes (Signed)
[facility]  ROUNDING NOTE   Subjective:  Patient was reintubated for agitation. Appears to be resting comfortably at the moment. Good urine output noted.   Objective:  Vital signs in last 24 hours:  Temp:  [97 F (36.1 C)-98.2 F (36.8 C)] 97 F (36.1 C) (11/15 0400) Pulse Rate:  [49-105] 105 (11/15 0800) Resp:  [13-36] 14 (11/15 0800) BP: (93-159)/(58-84) 108/65 (11/15 0800) SpO2:  [95 %-100 %] 100 % (11/15 0803) FiO2 (%):  [30 %-40 %] 30 % (11/15 0803)  Weight change:  Filed Weights   04/28/18 0500 04/29/18 0305 04/30/18 0500  Weight: 86.1 kg 83.4 kg 86 kg    Intake/Output: I/O last 3 completed shifts: In: 6500.6 [I.V.:2221.3; NG/GT:2673.3; IV ZCHYIFOYD:7412] Out: 2255 [Urine:2255]   Intake/Output this shift:  No intake/output data recorded.  Physical Exam: General: Critically ill appearing  Head: ETT in place  Eyes: Anicteric  Neck: Supple, trachea midline  Lungs:  Scattered rhonchi, vent assisted  Heart: S1S2 no rubs  Abdomen:  Soft, nontender, bowel sounds present  Extremities: no peripheral edema.  Neurologic: Intubated/sedated  Skin: No lesions       Basic Metabolic Panel: Recent Labs  Lab 04/27/18 1125  04/28/18 0313 04/29/18 0302 04/29/18 1725 04/30/18 0423 04/30/18 2359 05/01/18 0359  NA 133*   < > 137 140 142 143 140 141  K 2.9*   < > 4.0 3.5 3.9 3.7 3.6 3.4*  CL 104   < > 103 104 104 106 102 100  CO2 18*   < > 24 26 25 26 28 27   GLUCOSE 297*   < > 122* 108* 132* 164* 251* 213*  BUN 18   < > 33* 33* 31* 33* 39* 40*  CREATININE 1.26*   < > 2.52* 2.05* 1.70* 1.52* 1.67* 1.55*  CALCIUM 5.1*   < > 8.2* 8.4* 8.6* 8.5* 8.1* 8.2*  MG 1.3*  1.2*  --  1.8 2.0 1.9 2.1  --   --   PHOS 2.4*  --  4.6  --   --   --   --   --    < > = values in this interval not displayed.    Liver Function Tests: Recent Labs  Lab 04/26/18 0030 04/28/18 0313 04/29/18 0302 04/30/18 0423  AST 24 74* 66* 52*  ALT 16 28 30  35  ALKPHOS 79 68 58  59  BILITOT 0.7 0.6 0.6 0.6  PROT 7.5 5.8* 5.5* 5.9*  ALBUMIN 4.2 3.0* 2.6* 2.7*   No results for input(s): LIPASE, AMYLASE in the last 168 hours. Recent Labs  Lab 04/29/18 0302  AMMONIA 21    CBC: Recent Labs  Lab 04/26/18 0030  04/28/18 0313 04/29/18 0302 04/30/18 0423 04/30/18 2359 05/01/18 0359  WBC 13.2*   < > 29.6* 17.5* 9.7 10.8* 10.4  NEUTROABS 7.7  --   --  15.3* 9.0*  --   --   HGB 13.0   < > 10.6* 9.3* 8.5* 8.2* 9.0*  HCT 40.5   < > 33.1* 28.6* 26.3* 25.0* 26.8*  MCV 91.4   < > 93.0 89.4 88.6 90.3 89.9  PLT 237   < > 160 146* 126* 124* 132*   < > = values in this interval not displayed.    Cardiac Enzymes: Recent Labs  Lab 04/26/18 0030 04/26/18 0447 04/27/18 1125 04/27/18 1732 04/28/18 0313  TROPONINI 0.03* 0.16* 0.43* 0.57* 0.44*    BNP: Invalid input(s): POCBNP  CBG: Recent Labs  Lab  05/01/18 0010 05/01/18 0406 05/01/18 0409 05/01/18 0455 05/01/18 0732  GLUCAP 181* 360* 100* 116* 105*    Microbiology: Results for orders placed or performed during the hospital encounter of 04/26/18  Blood culture (routine x 2)     Status: None   Collection Time: 04/26/18  1:54 AM  Result Value Ref Range Status   Specimen Description BLOOD LEFT FATTY CASTS  Final   Special Requests   Final    BOTTLES DRAWN AEROBIC AND ANAEROBIC Blood Culture adequate volume   Culture   Final    NO GROWTH 5 DAYS Performed at [facility], [address] [place], [place] [address]    Report Status 05/01/2018 FINAL  Final  Blood culture (routine x 2)     Status: None   Collection Time: 04/26/18  1:55 AM  Result Value Ref Range Status   Specimen Description BLOOD LEFT ASSIST CONTROL  Final   Special Requests   Final    BOTTLES DRAWN AEROBIC AND ANAEROBIC Blood Culture results may not be optimal due to an excessive volume of blood received in culture bottles   Culture   Final    NO GROWTH 5 DAYS Performed at [facility], [address]  [place], [place] [address]    Report Status 05/01/2018 FINAL  Final  MRSA PCR Screening     Status: None   Collection Time: 04/26/18  2:54 AM  Result Value Ref Range Status   MRSA by PCR NEGATIVE NEGATIVE Final    Comment:        The GeneXpert MRSA Assay (FDA approved for NASAL specimens only), is one component of a comprehensive MRSA colonization surveillance program. It is not intended to diagnose MRSA infection nor to guide or monitor treatment for MRSA infections. Performed at [facility], [address] [place], [place] [address]   Culture, respiratory (non-expectorated)     Status: None   Collection Time: 04/27/18 10:06 AM  Result Value Ref Range Status   Specimen Description   Final    TRACHEAL ASPIRATE Performed at [facility], [address], [place], [place] [address]    Special Requests   Final    NONE Performed at [facility], [address] [place], [place] [address]    Gram Stain   Final    FEW WBC PRESENT, PREDOMINANTLY PMN RARE GRAM POSITIVE RODS    Culture   Final    FEW Consistent with normal respiratory flora. Performed at [facility], [address] [place], [place] [address]    Report Status 04/29/2018 FINAL  Final  CULTURE, BLOOD (ROUTINE X 2) w Reflex to ID Panel     Status: None (Preliminary result)   Collection Time: 04/28/18 11:52 AM  Result Value Ref Range Status   Specimen Description BLOOD LEFT HAND  Final   Special Requests   Final    BOTTLES DRAWN AEROBIC AND ANAEROBIC Blood Culture results may not be optimal due to an inadequate volume of blood received in culture bottles   Culture   Final    NO GROWTH 3 DAYS Performed at [facility], [address], [place], [place] [address]    Report Status PENDING  Incomplete  Culture, blood (Routine X 2) w Reflex to ID Panel     Status: None (Preliminary result)   Collection Time: 04/28/18  1:47 PM  Result Value Ref Range Status   Specimen  Description BLOOD BLOOD LEFT HAND  Final  Special Requests   Final    BOTTLES DRAWN AEROBIC AND ANAEROBIC Blood Culture adequate volume   Culture   Final    NO GROWTH 3 DAYS Performed at [facility], [address] [place], [place] [address]    Report Status PENDING  Incomplete    Coagulation Studies: Recent Labs    04/29/18 0302  LABPROT 14.3  INR 1.12    Urinalysis: No results for input(s): COLORURINE, LABSPEC, PHURINE, GLUCOSEU, HGBUR, BILIRUBINUR, KETONESUR, PROTEINUR, UROBILINOGEN, NITRITE, LEUKOCYTESUR in the last 72 hours.  Invalid input(s): APPERANCEUR    Imaging: US Renal  Result Date: 04/29/2018 CLINICAL DATA:  Acute renal failure EXAM: RENAL / URINARY TRACT ULTRASOUND COMPLETE COMPARISON:  Abdominal CT 05/16/2015 FINDINGS: Right Kidney: Renal measurements: 10 x 4 x 4 cm = volume: 87 mL. Simple cyst in the lower pole measuring 21 mm. No hydronephrosis or solid mass. Left Kidney: Renal measurements: 10 x 4 x 4 cm = volume: 86 mL. Echogenicity within normal limits. No mass or hydronephrosis visualized. Bladder: Appears normal for degree of bladder distention. Incidental lobulated but benign appearing cysts in the right liver, known from abdominal CT in 2016. Small right pleural effusion. IMPRESSION: 1. No acute finding or hydronephrosis. 2. Symmetric renal size with 80-90 cc volume. Electronically Signed   By: Monte Fantasia M.D.   On: 04/29/2018 10:25   Dg Chest Port 1 View  Result Date: 05/01/2018 CLINICAL DATA:  Shortness of breath EXAM: PORTABLE CHEST 1 VIEW COMPARISON:  April 30, 2018 FINDINGS: The right PICC line, ET tube, and pacemaker leads are stable. The NG tube terminates below today's film. The cardiomediastinal silhouette is stable. Decreasing mild hazy opacity in the left base. No other acute abnormalities. IMPRESSION: 1. Support apparatus as above. 2. Decreasing hazy opacity in the left base. Electronically Signed   By: Dorise Bullion III M.D    On: 05/01/2018 02:41   Dg Chest Port 1 View  Result Date: 04/30/2018 CLINICAL DATA:  77 y/o  M; intubation. EXAM: PORTABLE CHEST 1 VIEW COMPARISON:  04/30/2018 chest radiograph FINDINGS: Right PICC line tip projects over lower SVC. Endotracheal tube tip projects 4.1 cm above the carina. Increased reticular opacities of the lungs and hazy opacification at the lung bases. Probable small effusions. No pneumothorax. Stable cardiomediastinal silhouette. Two lead AICD noted. No acute osseous abnormality is evident. IMPRESSION: 1. Endotracheal tube tip projects 4.1 cm above the carina. Right PICC line tip projects over lower SVC. 2. Increased diffuse reticular and hazy basilar opacities of the lungs, probably worsening pulmonary edema or possibly atypical pneumonia. Electronically Signed   By: Kristine Garbe M.D.   On: 04/30/2018 20:05   Dg Chest Port 1 View  Result Date: 04/30/2018 CLINICAL DATA:  Shortness of breath EXAM: PORTABLE CHEST 1 VIEW COMPARISON:  04/30/2018 FINDINGS: The AICD appears unchanged. Indistinct pulmonary vasculature with peribronchovascular nodularity increased from prior. Worsened interstitial accentuation. Ill definition of the retrocardiac left lower lobe diaphragm, left lower lobe airspace opacity not excluded. No definite Kerley B lines. Heart size within normal limits. Right PICC line tip: SVC. IMPRESSION: 1. Indistinct pulmonary vasculature with peribronchovascular nodularity increased from prior, but no overt cardiomegaly. Questionable left lower lobe airspace opacity. Possibilities may include atypical infectious process or noncardiogenic edema. Chest CT could be utilized to further characterize if clinically warranted. If the patient has shortness of breath onset was sudden, consider risk factors for pulmonary embolus. Electronically Signed   By: Van Clines M.D.   On: 04/30/2018 17:50  Dg Chest Port 1 View  Result Date: 04/30/2018 CLINICAL DATA:  Shortness  of breath. EXAM: PORTABLE CHEST 1 VIEW COMPARISON:  04/29/2018 FINDINGS: Endotracheal and enteric tubes have been removed. Right PICC terminates over the lower SVC. An ICD remains in place. Aeration of the lung bases has improved, however there are persistent patchy bibasilar airspace opacities as well as hazy opacities suggesting persistent small pleural effusions. No pneumothorax is identified. IMPRESSION: Interval extubation with improved aeration of the lung bases. Persistent small pleural effusions. Electronically Signed   By: Logan Bores M.D.   On: 04/30/2018 08:11   Dg Abd Portable 1v  Result Date: 04/30/2018 CLINICAL DATA:  Evaluate OG tube placement EXAM: PORTABLE ABDOMEN - 1 VIEW COMPARISON:  None. FINDINGS: The OG tube terminates in the stomach. IMPRESSION: The OG tube terminates in the stomach. Electronically Signed   By: Dorise Bullion III M.D   On: 04/30/2018 23:38     Medications:   . amiodarone 60 mg/hr (05/01/18 0700)  . ampicillin-sulbactam (UNASYN) IV Stopped (05/01/18 0351)  . famotidine (PEPCID) IV Stopped (05/01/18 0041)  . heparin 900 Units/hr (05/01/18 0725)  . phenylephrine (NEO-SYNEPHRINE) Adult infusion Stopped (04/29/18 1642)  . propofol (DIPRIVAN) infusion 40 mcg/kg/min (05/01/18 0700)   . aspirin  81 mg Per Tube Daily  . atorvastatin  40 mg Per Tube Daily  . chlorhexidine  15 mL Mouth Rinse BID  . clopidogrel  75 mg Per Tube Daily  . furosemide  40 mg Intravenous Once  . insulin aspart  0-9 Units Subcutaneous Q6H  . ipratropium  0.5 mg Nebulization Q4H  . lipase/protease/amylase  12,000 Units Oral Q8H  . mouth rinse  15 mL Mouth Rinse q12n4p  . methylPREDNISolone (SOLU-MEDROL) injection  40 mg Intravenous Q12H  . metoprolol tartrate  5 mg Intravenous Q6H  . pantoprazole (PROTONIX) IV  40 mg Intravenous Daily  . sodium chloride flush  10-40 mL Intracatheter Q12H   acetaminophen **OR** acetaminophen, bisacodyl, bisacodyl, fentaNYL (SUBLIMAZE) injection,  fentaNYL (SUBLIMAZE) injection, haloperidol lactate, ipratropium-albuterol, midazolam, midazolam, ondansetron **OR** ondansetron (ZOFRAN) IV, sennosides, sodium chloride flush  Assessment/ Plan:  77 y.o. male with a PMHx of abdominal aortic aneurysm, coronary artery disease, congestive heart failure, exocrine pancreatic insufficiency, chronic kidney disease stage III baseline creatinine 1.6, prostate cancer, hypertension, who was admitted to [facility] on 04/26/2018 for evaluation of respiratory distress.  1.  Acute renal failure/chronic kidney disease stage III baseline creatinine 1.6.  It appears acute renal failure now is multifactorial with contributions from concurrent illness and hypotension.  Negative ANA/ANA/GBM/SPEP/UPEP -Renal function stable.  Creatinine 1.5.  Good urine output noted.  Avoid nephrotoxins as possible.  2.  Hypotension.    Patient remains off of phenylephrine.  Monitor blood pressure closely.  3.  Acute respiratory failure.  Patient unfortunately reintubated for agitation.  Maintain ventilatory support at this time.  LOS: 5 Ewell Benassi 11/15/20199:21 AM

## 2018-05-01 NOTE — Progress Notes (Signed)
Subjective: Patient had significant events overnight. He was noted to be struggling with breathing and wheezing on exam, initially doing well with a nonrebreather which was switched to high flow nasal cannula.  Respiratory changes noted after patient received ice chips.  There was some concerns for possible aspiration so stat chest x-ray obtained which showed bilateral increased interstitial markings.  Due to worsening respiratory status patient was reintubated and sedated.  Objective: Current vital signs: BP 108/65   Pulse (!) 105   Temp (!) 97 F (36.1 C) (Oral)   Resp 14   Ht 5\' 11"  (1.803 m)   Wt 86 kg   SpO2 100%   BMI 26.44 kg/m  Vital signs in last 24 hours: Temp:  [97 F (36.1 C)-98.2 F (36.8 C)] 97 F (36.1 C) (11/15 0400) Pulse Rate:  [49-105] 105 (11/15 0800) Resp:  [13-36] 14 (11/15 0800) BP: (93-159)/(58-84) 108/65 (11/15 0800) SpO2:  [95 %-100 %] 100 % (11/15 0803) FiO2 (%):  [30 %-40 %] 30 % (11/15 0803)  Intake/Output from previous day: 11/14 0701 - 11/15 0700 In: 2392.2 [I.V.:1446.2; IV Piggyback:946] Out: 2620 [BTDHR:4163] Intake/Output this shift: No intake/output data recorded. Nutritional status:  Diet Order            Diet NPO time specified  Diet effective now             Neurologic Exam: Mental Status: Intubated and sedated. Does not open eyes to verbal or sternal rub. Unable to follow commands.   Cranial Nerves: II: Discs flat bilaterally; Unable to assess visual fields.  Pupils equal, round, reactive to light. Cannot assess accommodation III,IV, VI: ptosis not present, oculocephalic reflex present V,VII: corneals intact bilaterally VIII: unable to assess IX,X: gag reflex present XI: unable to assess XII: Unable to assess Motor: Moves bilateral upper extremities to noxious stimuli. Withdraws lowers to noxious stimuli.  Tone and bulk:normal tone throughout; no atrophy noted Sensory: Respond to noxious stimuli in both upper and lower  extremity Deep Tendon Reflexes: 2+ and symmetric throughout Plantars: Right:muteLeft: mute Cerebellar: Unable to assess due to mental status Gait: not tested due to safety concerns  Lab Results: Basic Metabolic Panel: Recent Labs  Lab 04/27/18 1125  04/28/18 0313 04/29/18 0302 04/29/18 1725 04/30/18 0423 04/30/18 2359 05/01/18 0359  NA 133*   < > 137 140 142 143 140 141  K 2.9*   < > 4.0 3.5 3.9 3.7 3.6 3.4*  CL 104   < > 103 104 104 106 102 100  CO2 18*   < > 24 26 25 26 28 27   GLUCOSE 297*   < > 122* 108* 132* 164* 251* 213*  BUN 18   < > 33* 33* 31* 33* 39* 40*  CREATININE 1.26*   < > 2.52* 2.05* 1.70* 1.52* 1.67* 1.55*  CALCIUM 5.1*   < > 8.2* 8.4* 8.6* 8.5* 8.1* 8.2*  MG 1.3*  1.2*  --  1.8 2.0 1.9 2.1  --   --   PHOS 2.4*  --  4.6  --   --   --   --   --    < > = values in this interval not displayed.    Liver Function Tests: Recent Labs  Lab 04/26/18 0030 04/28/18 0313 04/29/18 0302 04/30/18 0423  AST 24 74* 66* 52*  ALT 16 28 30  35  ALKPHOS 79 68 58 59  BILITOT 0.7 0.6 0.6 0.6  PROT 7.5 5.8* 5.5* 5.9*  ALBUMIN 4.2 3.0* 2.6*  2.7*   No results for input(s): LIPASE, AMYLASE in the last 168 hours. Recent Labs  Lab 04/29/18 0302  AMMONIA 21    CBC: Recent Labs  Lab 04/26/18 0030  04/28/18 0313 04/29/18 0302 04/30/18 0423 04/30/18 2359 05/01/18 0359  WBC 13.2*   < > 29.6* 17.5* 9.7 10.8* 10.4  NEUTROABS 7.7  --   --  15.3* 9.0*  --   --   HGB 13.0   < > 10.6* 9.3* 8.5* 8.2* 9.0*  HCT 40.5   < > 33.1* 28.6* 26.3* 25.0* 26.8*  MCV 91.4   < > 93.0 89.4 88.6 90.3 89.9  PLT 237   < > 160 146* 126* 124* 132*   < > = values in this interval not displayed.    Cardiac Enzymes: Recent Labs  Lab 04/26/18 0030 04/26/18 0447 04/27/18 1125 04/27/18 1732 04/28/18 0313  TROPONINI 0.03* 0.16* 0.43* 0.57* 0.44*    Lipid Panel: Recent Labs  Lab 04/27/18 1125 04/27/18 1732 04/27/18 1946 04/30/18 2041  TRIG 91 611*  155* 130    CBG: Recent Labs  Lab 05/01/18 0010 05/01/18 0406 05/01/18 0409 05/01/18 0455 05/01/18 0732  GLUCAP 181* 360* 100* 116* 105*    Microbiology: Results for orders placed or performed during the hospital encounter of 04/26/18  Blood culture (routine x 2)     Status: None   Collection Time: 04/26/18  1:54 AM  Result Value Ref Range Status   Specimen Description BLOOD LEFT FATTY CASTS  Final   Special Requests   Final    BOTTLES DRAWN AEROBIC AND ANAEROBIC Blood Culture adequate volume   Culture   Final    NO GROWTH 5 DAYS Performed at [facility], [address] [place], [place] [address]    Report Status 05/01/2018 FINAL  Final  Blood culture (routine x 2)     Status: None   Collection Time: 04/26/18  1:55 AM  Result Value Ref Range Status   Specimen Description BLOOD LEFT ASSIST CONTROL  Final   Special Requests   Final    BOTTLES DRAWN AEROBIC AND ANAEROBIC Blood Culture results may not be optimal due to an excessive volume of blood received in culture bottles   Culture   Final    NO GROWTH 5 DAYS Performed at [facility], [address] [place], [place] [address]    Report Status 05/01/2018 FINAL  Final  MRSA PCR Screening     Status: None   Collection Time: 04/26/18  2:54 AM  Result Value Ref Range Status   MRSA by PCR NEGATIVE NEGATIVE Final    Comment:        The GeneXpert MRSA Assay (FDA approved for NASAL specimens only), is one component of a comprehensive MRSA colonization surveillance program. It is not intended to diagnose MRSA infection nor to guide or monitor treatment for MRSA infections. Performed at [facility], [address] [place], [place] [address]   Culture, respiratory (non-expectorated)     Status: None   Collection Time: 04/27/18 10:06 AM  Result Value Ref Range Status   Specimen Description   Final    TRACHEAL ASPIRATE Performed at [facility], [address]  [place], [place] [address]    Special Requests   Final    NONE Performed at [facility], [place], [place] [address]    Gram Stain   Final    FEW WBC PRESENT, PREDOMINANTLY PMN RARE GRAM POSITIVE RODS  Culture   Final    FEW Consistent with normal respiratory flora. Performed at [facility], [place] [address], [place], [place] [address]    Report Status 04/29/2018 FINAL  Final  CULTURE, BLOOD (ROUTINE X 2) w Reflex to ID Panel     Status: None (Preliminary result)   Collection Time: 04/28/18 11:52 AM  Result Value Ref Range Status   Specimen Description BLOOD LEFT HAND  Final   Special Requests   Final    BOTTLES DRAWN AEROBIC AND ANAEROBIC Blood Culture results may not be optimal due to an inadequate volume of blood received in culture bottles   Culture   Final    NO GROWTH 3 DAYS Performed at [facility], [address] [place], [place] [address]    Report Status PENDING  Incomplete  Culture, blood (Routine X 2) w Reflex to ID Panel     Status: None (Preliminary result)   Collection Time: 04/28/18  1:47 PM  Result Value Ref Range Status   Specimen Description BLOOD BLOOD LEFT HAND  Final   Special Requests   Final    BOTTLES DRAWN AEROBIC AND ANAEROBIC Blood Culture adequate volume   Culture   Final    NO GROWTH 3 DAYS Performed at [facility], [address] [place], [place] [address]    Report Status PENDING  Incomplete    Coagulation Studies: Recent Labs    04/29/18 0302  LABPROT 14.3  INR 1.12    Imaging: US Renal  Result Date: 04/29/2018 CLINICAL DATA:  Acute renal failure EXAM: RENAL / URINARY TRACT ULTRASOUND COMPLETE COMPARISON:  Abdominal CT 05/16/2015 FINDINGS: Right Kidney: Renal measurements: 10 x 4 x 4 cm = volume: 87 mL. Simple cyst in the lower pole measuring 21 mm. No hydronephrosis or solid mass. Left Kidney: Renal measurements: 10 x 4 x 4 cm = volume: 86 mL. Echogenicity within normal limits. No  mass or hydronephrosis visualized. Bladder: Appears normal for degree of bladder distention. Incidental lobulated but benign appearing cysts in the right liver, known from abdominal CT in 2016. Small right pleural effusion. IMPRESSION: 1. No acute finding or hydronephrosis. 2. Symmetric renal size with 80-90 cc volume. Electronically Signed   By: Monte Fantasia M.D.   On: 04/29/2018 10:25   Dg Chest Port 1 View  Result Date: 05/01/2018 CLINICAL DATA:  Shortness of breath EXAM: PORTABLE CHEST 1 VIEW COMPARISON:  April 30, 2018 FINDINGS: The right PICC line, ET tube, and pacemaker leads are stable. The NG tube terminates below today's film. The cardiomediastinal silhouette is stable. Decreasing mild hazy opacity in the left base. No other acute abnormalities. IMPRESSION: 1. Support apparatus as above. 2. Decreasing hazy opacity in the left base. Electronically Signed   By: Dorise Bullion III M.D   On: 05/01/2018 02:41   Dg Chest Port 1 View  Result Date: 04/30/2018 CLINICAL DATA:  77 y/o  M; intubation. EXAM: PORTABLE CHEST 1 VIEW COMPARISON:  04/30/2018 chest radiograph FINDINGS: Right PICC line tip projects over lower SVC. Endotracheal tube tip projects 4.1 cm above the carina. Increased reticular opacities of the lungs and hazy opacification at the lung bases. Probable small effusions. No pneumothorax. Stable cardiomediastinal silhouette. Two lead AICD noted. No acute osseous abnormality is evident. IMPRESSION: 1. Endotracheal tube tip projects 4.1 cm above the carina. Right PICC line tip projects over lower SVC. 2. Increased diffuse reticular and hazy basilar opacities of the lungs, probably worsening pulmonary edema or possibly atypical  pneumonia. Electronically Signed   By: Kristine Garbe M.D.   On: 04/30/2018 20:05   Dg Chest Port 1 View  Result Date: 04/30/2018 CLINICAL DATA:  Shortness of breath EXAM: PORTABLE CHEST 1 VIEW COMPARISON:  04/30/2018 FINDINGS: The AICD appears  unchanged. Indistinct pulmonary vasculature with peribronchovascular nodularity increased from prior. Worsened interstitial accentuation. Ill definition of the retrocardiac left lower lobe diaphragm, left lower lobe airspace opacity not excluded. No definite Kerley B lines. Heart size within normal limits. Right PICC line tip: SVC. IMPRESSION: 1. Indistinct pulmonary vasculature with peribronchovascular nodularity increased from prior, but no overt cardiomegaly. Questionable left lower lobe airspace opacity. Possibilities may include atypical infectious process or noncardiogenic edema. Chest CT could be utilized to further characterize if clinically warranted. If the patient has shortness of breath onset was sudden, consider risk factors for pulmonary embolus. Electronically Signed   By: Van Clines M.D.   On: 04/30/2018 17:50   Dg Chest Port 1 View  Result Date: 04/30/2018 CLINICAL DATA:  Shortness of breath. EXAM: PORTABLE CHEST 1 VIEW COMPARISON:  04/29/2018 FINDINGS: Endotracheal and enteric tubes have been removed. Right PICC terminates over the lower SVC. An ICD remains in place. Aeration of the lung bases has improved, however there are persistent patchy bibasilar airspace opacities as well as hazy opacities suggesting persistent small pleural effusions. No pneumothorax is identified. IMPRESSION: Interval extubation with improved aeration of the lung bases. Persistent small pleural effusions. Electronically Signed   By: Logan Bores M.D.   On: 04/30/2018 08:11   Dg Abd Portable 1v  Result Date: 04/30/2018 CLINICAL DATA:  Evaluate OG tube placement EXAM: PORTABLE ABDOMEN - 1 VIEW COMPARISON:  None. FINDINGS: The OG tube terminates in the stomach. IMPRESSION: The OG tube terminates in the stomach. Electronically Signed   By: Dorise Bullion III M.D   On: 04/30/2018 23:38    Medications:  I have reviewed the patient's current medications. Prior to Admission:  Medications Prior to Admission   Medication Sig Dispense Refill Last Dose  . aspirin EC 81 MG tablet Take 81 mg by mouth daily.   Unknown at Unknown  . atorvastatin (LIPITOR) 40 MG tablet Take 40 mg by mouth daily.   Unknown at Unknown  . clopidogrel (PLAVIX) 75 MG tablet Take 75 mg by mouth daily.  5 Unknown at Unknown  . furosemide (LASIX) 40 MG tablet Take 20 mg by mouth daily.   Unknown at Unknown  . lisinopril (PRINIVIL,ZESTRIL) 5 MG tablet Take 5 mg by mouth daily.   Unknown at Unknown  . magnesium (MAGTAB) 84 MG (7MEQ) TBCR SR tablet Take 84 mg by mouth daily.   Unknown at Unknown  . Melatonin 1 MG TABS Take 1 tablet by mouth at bedtime as needed.   prn at prn  . metoprolol succinate (TOPROL-XL) 100 MG 24 hr tablet Take 100 mg by mouth daily. Take with or immediately following a meal.   Unknown at Unknown  . Multiple Vitamins-Minerals (MULTIVITAMIN WITH MINERALS) tablet Take 1 tablet by mouth daily.   Unknown at Unknown  . omeprazole (PRILOSEC) 20 MG capsule Take 20 mg by mouth daily.   Unknown at Unknown  . oxybutynin (DITROPAN XL) 15 MG 24 hr tablet Take 15 mg by mouth at bedtime.   Unknown at Unknown  . lipase/protease/amylase (CREON) 12000 units CPEP capsule Take 24,000 Units by mouth 2 (two) times daily at 10 am and 4 pm.   Not Taking at Unknown time   Scheduled: . aspirin  81 mg Per Tube Daily  . atorvastatin  40 mg Per Tube Daily  . chlorhexidine  15 mL Mouth Rinse BID  . clopidogrel  75 mg Per Tube Daily  . furosemide  40 mg Intravenous Once  . insulin aspart  0-9 Units Subcutaneous Q6H  . ipratropium  0.5 mg Nebulization Q4H  . lipase/protease/amylase  12,000 Units Oral Q8H  . mouth rinse  15 mL Mouth Rinse q12n4p  . methylPREDNISolone (SOLU-MEDROL) injection  40 mg Intravenous Q12H  . metoprolol tartrate  5 mg Intravenous Q6H  . pantoprazole (PROTONIX) IV  40 mg Intravenous Daily  . sodium chloride flush  10-40 mL Intracatheter Q12H    Patient seen and examined.  Clinical course and management  discussed.  Necessary edits performed.  I agree with the above.  Assessment and plan of care developed and discussed below.     Assessment: 77 y.o male presenting in severe respiratory distress and hypertensive crisis. Now with altered mental status in the setting of suspected PNA and atrial fibrillation (now converted, on heparin). Extubated on 04/29/2018 however developed respiratory distress overnight due to possible aspiration and reintubated.  STAT chest x-ray obtained which shows bilateral interstitial markings.  With history of atrial fibrillation concerns for shower of emboli still in the differential.  Unable to obtain MRI of the brain due to ICD.  Initial CT head did not show acute intracranial abnormality.  EEG showed slowing consistent with normal drowse, possibility of slowing related to general cerebral disturbance such as metabolic encephalopathy.  No epileptiform discharges noted.  Plan 1. Repeat CT head without contrast today 2. Agree with current medical management   This patient was staffed with Dr. Magda Paganini, Doy Mince who personally evaluated patient, reviewed documentation and agreed with assessment and plan of care as above.  Rufina Falco, DNP, FNP-BC Board certified Nurse Practitioner Neurology Department   LOS: 5 days   05/01/2018  8:59 AM  Alexis Goodell, MD Neurology [phone]  05/01/2018  11:28 AM

## 2018-05-01 NOTE — Progress Notes (Signed)
Progress Note  Patient Name: Matthew Zamora Date of Encounter: 05/01/2018  Primary Cardiologist: [facility]  Subjective   Developed resp failure last night and req intubation.  Recurrent Afib for ~ 2 hrs this AM.  Remains intubated/sedated.  Inpatient Medications    Scheduled Meds: . aspirin  81 mg Per Tube Daily  . atorvastatin  40 mg Per Tube Daily  . chlorhexidine  15 mL Mouth Rinse BID  . clopidogrel  75 mg Per Tube Daily  . furosemide  40 mg Intravenous Once  . insulin aspart  0-9 Units Subcutaneous Q6H  . ipratropium  0.5 mg Nebulization Q4H  . lipase/protease/amylase  12,000 Units Oral Q8H  . mouth rinse  15 mL Mouth Rinse q12n4p  . methylPREDNISolone (SOLU-MEDROL) injection  40 mg Intravenous Q12H  . metoprolol tartrate  5 mg Intravenous Q6H  . pantoprazole (PROTONIX) IV  40 mg Intravenous Daily  . sodium chloride flush  10-40 mL Intracatheter Q12H   Continuous Infusions: . amiodarone 60 mg/hr (05/01/18 0700)  . ampicillin-sulbactam (UNASYN) IV Stopped (05/01/18 0351)  . famotidine (PEPCID) IV Stopped (05/01/18 0041)  . heparin 900 Units/hr (05/01/18 0725)  . phenylephrine (NEO-SYNEPHRINE) Adult infusion Stopped (04/29/18 1642)  . propofol (DIPRIVAN) infusion 40 mcg/kg/min (05/01/18 0700)   PRN Meds: acetaminophen **OR** acetaminophen, bisacodyl, bisacodyl, fentaNYL (SUBLIMAZE) injection, fentaNYL (SUBLIMAZE) injection, haloperidol lactate, ipratropium-albuterol, midazolam, midazolam, ondansetron **OR** ondansetron (ZOFRAN) IV, sennosides, sodium chloride flush   Vital Signs    Vitals:   05/01/18 0600 05/01/18 0700 05/01/18 0800 05/01/18 0803  BP: (!) 141/67 (!) 159/75 108/65   Pulse: 64 82 (!) 105   Resp: 16 (!) 22 14   Temp:      TempSrc:      SpO2: 100% 100% 100% 100%  Weight:      Height:        Intake/Output Summary (Last 24 hours) at 05/01/2018 0904 Last data filed at 05/01/2018 0700 Gross per 24 hour  Intake 2002.27 ml  Output 1480 ml  Net  522.27 ml   Filed Weights   04/28/18 0500 04/29/18 0305 04/30/18 0500  Weight: 86.1 kg 83.4 kg 86 kg    Physical Exam   GEN: Intubated and sedated.   HEENT: Grossly normal.  Neck: Supple, no JVD, carotid bruits, or masses. Cardiac: RRR, no murmurs, rubs, or gallops. No clubbing, cyanosis, edema.  Radials/DP/PT 2+ and equal bilaterally.  Respiratory:  Respirations regular and unlabored, diminished breath sounds at bases. GI: Soft, nontender, nondistended, BS + x 4. MS: no deformity or atrophy. Skin: warm and dry, no rash. Neuro:  Strength and sensation are intact. Psych: Intubated and sedated.  Labs    Chemistry Recent Labs  Lab 04/28/18 0313 04/29/18 0302  04/30/18 0423 04/30/18 2359 05/01/18 0359  NA 137 140   < > 143 140 141  K 4.0 3.5   < > 3.7 3.6 3.4*  CL 103 104   < > 106 102 100  CO2 24 26   < > 26 28 27   GLUCOSE 122* 108*   < > 164* 251* 213*  BUN 33* 33*   < > 33* 39* 40*  CREATININE 2.52* 2.05*   < > 1.52* 1.67* 1.55*  CALCIUM 8.2* 8.4*   < > 8.5* 8.1* 8.2*  PROT 5.8* 5.5*  --  5.9*  --   --   ALBUMIN 3.0* 2.6*  --  2.7*  --   --   AST 74* 66*  --  52*  --   --  ALT 28 30  --  35  --   --   ALKPHOS 68 58  --  59  --   --   BILITOT 0.6 0.6  --  0.6  --   --   GFRNONAA 23* 30*   < > 43* 38* 42*  GFRAA 27* 35*   < > 50* 44* 48*  ANIONGAP 10 10   < > 11 10 14    < > = values in this interval not displayed.     Hematology Recent Labs  Lab 04/30/18 0423 04/30/18 2359 05/01/18 0359  WBC 9.7 10.8* 10.4  RBC 2.97* 2.77* 2.98*  HGB 8.5* 8.2* 9.0*  HCT 26.3* 25.0* 26.8*  MCV 88.6 90.3 89.9  MCH 28.6 29.6 30.2  MCHC 32.3 32.8 33.6  RDW 14.6 14.9 15.0  PLT 126* 124* 132*    Cardiac Enzymes Recent Labs  Lab 04/26/18 0447 04/27/18 1125 04/27/18 1732 04/28/18 0313  TROPONINI 0.16* 0.43* 0.57* 0.44*      BNP Recent Labs  Lab 04/26/18 0030 04/27/18 1054 04/29/18 0302  BNP 717.0* 799.0* 889.0*     Radiology    US Renal  Result Date:  04/29/2018 CLINICAL DATA:  Acute renal failure EXAM: RENAL / URINARY TRACT ULTRASOUND COMPLETE COMPARISON:  Abdominal CT 05/16/2015 FINDINGS: Right Kidney: Renal measurements: 10 x 4 x 4 cm = volume: 87 mL. Simple cyst in the lower pole measuring 21 mm. No hydronephrosis or solid mass. Left Kidney: Renal measurements: 10 x 4 x 4 cm = volume: 86 mL. Echogenicity within normal limits. No mass or hydronephrosis visualized. Bladder: Appears normal for degree of bladder distention. Incidental lobulated but benign appearing cysts in the right liver, known from abdominal CT in 2016. Small right pleural effusion. IMPRESSION: 1. No acute finding or hydronephrosis. 2. Symmetric renal size with 80-90 cc volume. Electronically Signed   By: Monte Fantasia M.D.   On: 04/29/2018 10:25   Dg Chest Port 1 View  Result Date: 05/01/2018 CLINICAL DATA:  Shortness of breath EXAM: PORTABLE CHEST 1 VIEW COMPARISON:  April 30, 2018 FINDINGS: The right PICC line, ET tube, and pacemaker leads are stable. The NG tube terminates below today's film. The cardiomediastinal silhouette is stable. Decreasing mild hazy opacity in the left base. No other acute abnormalities. IMPRESSION: 1. Support apparatus as above. 2. Decreasing hazy opacity in the left base. Electronically Signed   By: Dorise Bullion III M.D   On: 05/01/2018 02:41   Dg Chest Port 1 View  Result Date: 04/30/2018 CLINICAL DATA:  77 y/o  M; intubation. EXAM: PORTABLE CHEST 1 VIEW COMPARISON:  04/30/2018 chest radiograph FINDINGS: Right PICC line tip projects over lower SVC. Endotracheal tube tip projects 4.1 cm above the carina. Increased reticular opacities of the lungs and hazy opacification at the lung bases. Probable small effusions. No pneumothorax. Stable cardiomediastinal silhouette. Two lead AICD noted. No acute osseous abnormality is evident. IMPRESSION: 1. Endotracheal tube tip projects 4.1 cm above the carina. Right PICC line tip projects over lower SVC. 2.  Increased diffuse reticular and hazy basilar opacities of the lungs, probably worsening pulmonary edema or possibly atypical pneumonia. Electronically Signed   By: Kristine Garbe M.D.   On: 04/30/2018 20:05   Dg Chest Port 1 View  Result Date: 04/30/2018 CLINICAL DATA:  Shortness of breath EXAM: PORTABLE CHEST 1 VIEW COMPARISON:  04/30/2018 FINDINGS: The AICD appears unchanged. Indistinct pulmonary vasculature with peribronchovascular nodularity increased from prior. Worsened interstitial accentuation. Ill definition of the  retrocardiac left lower lobe diaphragm, left lower lobe airspace opacity not excluded. No definite Kerley B lines. Heart size within normal limits. Right PICC line tip: SVC. IMPRESSION: 1. Indistinct pulmonary vasculature with peribronchovascular nodularity increased from prior, but no overt cardiomegaly. Questionable left lower lobe airspace opacity. Possibilities may include atypical infectious process or noncardiogenic edema. Chest CT could be utilized to further characterize if clinically warranted. If the patient has shortness of breath onset was sudden, consider risk factors for pulmonary embolus. Electronically Signed   By: Van Clines M.D.   On: 04/30/2018 17:50   Dg Chest Port 1 View  Result Date: 04/30/2018 CLINICAL DATA:  Shortness of breath. EXAM: PORTABLE CHEST 1 VIEW COMPARISON:  04/29/2018 FINDINGS: Endotracheal and enteric tubes have been removed. Right PICC terminates over the lower SVC. An ICD remains in place. Aeration of the lung bases has improved, however there are persistent patchy bibasilar airspace opacities as well as hazy opacities suggesting persistent small pleural effusions. No pneumothorax is identified. IMPRESSION: Interval extubation with improved aeration of the lung bases. Persistent small pleural effusions. Electronically Signed   By: Logan Bores M.D.   On: 04/30/2018 08:11   Dg Abd Portable 1v  Result Date: 04/30/2018 CLINICAL  DATA:  Evaluate OG tube placement EXAM: PORTABLE ABDOMEN - 1 VIEW COMPARISON:  None. FINDINGS: The OG tube terminates in the stomach. IMPRESSION: The OG tube terminates in the stomach. Electronically Signed   By: Dorise Bullion III M.D   On: 04/30/2018 23:38    Telemetry    Sinus  AFib 90's to low 100's between 7:11 and 9:32 this AM.  Personally reviewed.  Cardiac Studies   2D Echocardiogram 11.10.2019  Study Conclusions   - Left ventricle: The cavity size was normal. There was moderate   focal basal hypertrophy of the septum. Systolic function was   mildly to moderately reduced. The estimated ejection fraction was   in the range of 40% to 45%. Images were inadequate for LV wall   motion assessment. The study is not technically sufficient to   allow evaluation of LV diastolic function. - Mitral valve: There was mild regurgitation. - Left atrium: The atrium was moderately dilated. - Right atrium: The atrium was mildly dilated. - Pulmonary arteries: Systolic pressure was moderately increased.   PA peak pressure: 50 mm Hg (S). - Inferior vena cava: The vessel was dilated. The respirophasic   diameter changes were blunted (< 50%), consistent with elevated   central venous pressure. _____________   Patient Profile     77 y.o. male with a hx of coronary artery disease, chronic systolic heart failure, ventricular tachycardia status post ICD, and chronic kidney disease stage III,who was admitted due to acute respiratory failure thought to be due to no pneumonia and developed intermittent episodes of atrial fibrillation with rapid ventricular response.  Assessment & Plan    1.  Acute resp failure: Reintubated 11/14 PM in setting of hypoxia/hypercarbia.  Remains intubated and sedated.  Management per critical care.  2.  PAF w/ RVR: Patient with recurrent A. fib this morning following intubation and sedation overnight.  Currently in sinus rhythm.  Continue intravenous amiodarone,  beta-blocker, and heparin therapy.  CHA2DS2VASc equals 5.  Will likely require oral anticoagulation.  When that occurs, would plan to discontinue aspirin.  3.  CAD/elevated troponin: s/p prior RCA stenting w/ DES x 3.  Mild troponin elevation in the setting of above with a peak of 0.57.  Echocardiogram shows an EF of 40  to 45%, which is relatively stable compared to prior echo done at [facility] in 2018.  In the setting of acute on chronic kidney disease and respiratory failure, he is not a good candidate for ischemic evaluation at this time.  Continue aspirin, Plavix, and beta-blocker.  Plan resume statin once able to take p.o.'s.  4. HFrEF: EF 40-45% by echo 11/10.  EF 40 to 45%.  Appears euvolemic on exam.  Does have a PICC line and we could transduce CVP if necessary.  Continue IV beta-blocker.  5.  Acute on chronic stage III kidney dzs:  Creat 1.5 today.  Renal following.  6.  Normocytic anemia:  Stable.  7.  Hypokalemia: supplementation ordered.   Signed, Murray Hodgkins, NP  05/01/2018, 9:04 AM    For questions or updates, please contact   Please consult [email or web address] for contact info under Cardiology/STEMI.

## 2018-05-01 NOTE — Progress Notes (Signed)
Patient ID: Matthew Zamora, male   DOB: Apr 24, 1941, 77 y.o.   MRN: [id number]  [facility] PROGRESS NOTE  JOHNNATHAN HAGEMEISTER [id number] DOB: 1940-06-22 DOA: 04/26/2018 PCP: [place] Pink, MD  HPI/Subjective: Patient got reintubated last night.  Apparently had more agitation and was struggling to oxygenate on the high flow nasal cannula.  Patient currently sedated and intubated.  Objective: Vitals:   05/01/18 1211 05/01/18 1300  BP:  (!) 156/72  Pulse:  81  Resp:  17  Temp:    SpO2: 100% 100%    Filed Weights   04/28/18 0500 04/29/18 0305 04/30/18 0500  Weight: 86.1 kg 83.4 kg 86 kg    ROS: Review of Systems  Unable to perform ROS: Intubated   Exam: Physical Exam  HENT:  Nose: No mucosal edema.  Mouth/Throat: No oropharyngeal exudate or posterior oropharyngeal edema.  Unable to look into mouth  Eyes: Conjunctivae and lids are normal.  Neck: No JVD present. Carotid bruit is not present. No edema present. No thyroid mass and no thyromegaly present.  Cardiovascular: Regular rhythm, S1 normal, S2 normal and normal heart sounds. Exam reveals no gallop.  No murmur heard. Pulses:      Dorsalis pedis pulses are 2+ on the right side, and 2+ on the left side.  Respiratory: No respiratory distress. He has decreased breath sounds in the right middle field, the right lower field, the left middle field and the left lower field. He has wheezes in the right middle field and the left middle field. He has rhonchi in the right lower field and the left lower field. He has no rales.  GI: Soft. Bowel sounds are normal. There is no tenderness.  Musculoskeletal:       Right ankle: He exhibits no swelling.       Left ankle: He exhibits no swelling.  Lymphadenopathy:    He has no cervical adenopathy.  Neurological:  Sedated on ventilator  Skin: Skin is warm. No rash noted. Nails show no clubbing.  Psychiatric:  Unable to assess secondary to intubation      Data  Reviewed: Basic Metabolic Panel: Recent Labs  Lab 04/27/18 1125  04/28/18 0313 04/29/18 0302 04/29/18 1725 04/30/18 0423 04/30/18 2359 05/01/18 0359 05/01/18 1313  NA 133*   < > 137 140 142 143 140 141  --   K 2.9*   < > 4.0 3.5 3.9 3.7 3.6 3.4*  --   CL 104   < > 103 104 104 106 102 100  --   CO2 18*   < > _0 --   GLUCOSE 297*   < > 122* 108* 132* 164* 251* 213*  --   BUN 18   < > 33* 33* 31* 33* 39* 40*  --   CREATININE 1.26*   < > 2.52* 2.05* 1.70* 1.52* 1.67* 1.55*  --   CALCIUM 5.1*   < > 8.2* 8.4* 8.6* 8.5* 8.1* 8.2*  --   MG 1.3*  1.2*  --  1.8 2.0 1.9 2.1  --   --  2.3  PHOS 2.4*  --  4.6  --   --   --   --   --  3.5   < > = values in this interval not displayed.   Liver Function Tests: Recent Labs  Lab 04/26/18 0030 04/28/18 0313 04/29/18 0302 04/30/18 0423  AST 24 74* 66* 52*  ALT _1 35  ALKPHOS 79 68  58 59  BILITOT 0.7 0.6 0.6 0.6  PROT 7.5 5.8* 5.5* 5.9*  ALBUMIN 4.2 3.0* 2.6* 2.7*   CBC: Recent Labs  Lab 04/26/18 0030  04/28/18 0313 04/29/18 0302 04/30/18 0423 04/30/18 2359 05/01/18 0359  WBC 13.2*   < > 29.6* 17.5* 9.7 10.8* 10.4  NEUTROABS 7.7  --   --  15.3* 9.0*  --   --   HGB 13.0   < > 10.6* 9.3* 8.5* 8.2* 9.0*  HCT 40.5   < > 33.1* 28.6* 26.3* 25.0* 26.8*  MCV 91.4   < > 93.0 89.4 88.6 90.3 89.9  PLT 237   < > 160 146* 126* 124* 132*   < > = values in this interval not displayed.   Cardiac Enzymes: Recent Labs  Lab 04/26/18 0030 04/26/18 0447 04/27/18 1125 04/27/18 1732 04/28/18 0313  TROPONINI 0.03* 0.16* 0.43* 0.57* 0.44*   BNP (last 3 results) Recent Labs    04/26/18 0030 04/27/18 1054 04/29/18 0302  BNP 717.0* 799.0* 889.0*    CBG: Recent Labs  Lab 05/01/18 0406 05/01/18 0409 05/01/18 0455 05/01/18 0732 05/01/18 1214  GLUCAP 360* 100* 116* 105* 108*    Recent Results (from the past 240 hour(s))  Blood culture (routine x 2)     Status: None   Collection Time: 04/26/18  1:54 AM  Result  Value Ref Range Status   Specimen Description BLOOD LEFT FATTY CASTS  Final   Special Requests   Final    BOTTLES DRAWN AEROBIC AND ANAEROBIC Blood Culture adequate volume   Culture   Final    NO GROWTH 5 DAYS Performed at [facility], [address] [place], [place] [address]    Report Status 05/01/2018 FINAL  Final  Blood culture (routine x 2)     Status: None   Collection Time: 04/26/18  1:55 AM  Result Value Ref Range Status   Specimen Description BLOOD LEFT ASSIST CONTROL  Final   Special Requests   Final    BOTTLES DRAWN AEROBIC AND ANAEROBIC Blood Culture results may not be optimal due to an excessive volume of blood received in culture bottles   Culture   Final    NO GROWTH 5 DAYS Performed at [facility], [address] [place], [place] [address]    Report Status 05/01/2018 FINAL  Final  MRSA PCR Screening     Status: None   Collection Time: 04/26/18  2:54 AM  Result Value Ref Range Status   MRSA by PCR NEGATIVE NEGATIVE Final    Comment:        The GeneXpert MRSA Assay (FDA approved for NASAL specimens only), is one component of a comprehensive MRSA colonization surveillance program. It is not intended to diagnose MRSA infection nor to guide or monitor treatment for MRSA infections. Performed at [facility], [address] [place], [place] [address]   Culture, respiratory (non-expectorated)     Status: None   Collection Time: 04/27/18 10:06 AM  Result Value Ref Range Status   Specimen Description   Final    TRACHEAL ASPIRATE Performed at [facility], [address] [place], [place] [address]    Special Requests   Final    NONE Performed at [facility], [address] [place], [place] [address]    Gram Stain   Final    FEW WBC PRESENT, PREDOMINANTLY PMN RARE GRAM POSITIVE RODS    Culture   Final    FEW Consistent with  normal respiratory flora. Performed at [facility], [address] [place], [place] [address]    Report Status 04/29/2018 FINAL  Final  CULTURE, BLOOD (ROUTINE X 2) w Reflex to ID Panel     Status: None (Preliminary result)   Collection Time: 04/28/18 11:52 AM  Result Value Ref Range Status   Specimen Description BLOOD LEFT HAND  Final   Special Requests   Final    BOTTLES DRAWN AEROBIC AND ANAEROBIC Blood Culture results may not be optimal due to an inadequate volume of blood received in culture bottles   Culture   Final    NO GROWTH 3 DAYS Performed at [facility], [address] [place], [place] [address]    Report Status PENDING  Incomplete  Culture, blood (Routine X 2) w Reflex to ID Panel     Status: None (Preliminary result)   Collection Time: 04/28/18  1:47 PM  Result Value Ref Range Status   Specimen Description BLOOD BLOOD LEFT HAND  Final   Special Requests   Final    BOTTLES DRAWN AEROBIC AND ANAEROBIC Blood Culture adequate volume   Culture   Final    NO GROWTH 3 DAYS Performed at [facility], [address], [place], [place] [address]    Report Status PENDING  Incomplete     Studies: Ct Head Wo Contrast  Result Date: 05/01/2018 CLINICAL DATA:  Acute encephalopathy with intermittent agitation. EXAM: CT HEAD WITHOUT CONTRAST TECHNIQUE: Contiguous axial images were obtained from the base of the skull through the vertex without intravenous contrast. COMPARISON:  04/28/2018 FINDINGS: Brain: There is no evidence of acute infarct, intracranial hemorrhage, mass, midline shift, or extra-axial fluid collection. The ventricles and sulci are within normal limits for age. Vascular: Calcified atherosclerosis at the skull base. No hyperdense vessel. Skull: No fracture or focal osseous lesion. Sinuses/Orbits: Bilateral cataract extraction. Visualized paranasal sinuses and mastoid air cells are clear. Other: None. IMPRESSION: No evidence of acute intracranial abnormality. Electronically Signed   By: Logan Bores M.D.   On:  05/01/2018 11:33   Dg Chest Port 1 View  Result Date: 05/01/2018 CLINICAL DATA:  Shortness of breath EXAM: PORTABLE CHEST 1 VIEW COMPARISON:  April 30, 2018 FINDINGS: The right PICC line, ET tube, and pacemaker leads are stable. The NG tube terminates below today's film. The cardiomediastinal silhouette is stable. Decreasing mild hazy opacity in the left base. No other acute abnormalities. IMPRESSION: 1. Support apparatus as above. 2. Decreasing hazy opacity in the left base. Electronically Signed   By: Dorise Bullion III M.D   On: 05/01/2018 02:41   Dg Chest Port 1 View  Result Date: 04/30/2018 CLINICAL DATA:  77 y/o  M; intubation. EXAM: PORTABLE CHEST 1 VIEW COMPARISON:  04/30/2018 chest radiograph FINDINGS: Right PICC line tip projects over lower SVC. Endotracheal tube tip projects 4.1 cm above the carina. Increased reticular opacities of the lungs and hazy opacification at the lung bases. Probable small effusions. No pneumothorax. Stable cardiomediastinal silhouette. Two lead AICD noted. No acute osseous abnormality is evident. IMPRESSION: 1. Endotracheal tube tip projects 4.1 cm above the carina. Right PICC line tip projects over lower SVC. 2. Increased diffuse reticular and hazy basilar opacities of the lungs, probably worsening pulmonary edema or possibly atypical pneumonia. Electronically Signed   By: Kristine Garbe M.D.   On: 04/30/2018 20:05   Dg Chest Port 1 View  Result Date: 04/30/2018 CLINICAL DATA:  Shortness of breath EXAM: PORTABLE CHEST 1  VIEW COMPARISON:  04/30/2018 FINDINGS: The AICD appears unchanged. Indistinct pulmonary vasculature with peribronchovascular nodularity increased from prior. Worsened interstitial accentuation. Ill definition of the retrocardiac left lower lobe diaphragm, left lower lobe airspace opacity not excluded. No definite Kerley B lines. Heart size within normal limits. Right PICC line tip: SVC. IMPRESSION: 1. Indistinct pulmonary vasculature  with peribronchovascular nodularity increased from prior, but no overt cardiomegaly. Questionable left lower lobe airspace opacity. Possibilities may include atypical infectious process or noncardiogenic edema. Chest CT could be utilized to further characterize if clinically warranted. If the patient has shortness of breath onset was sudden, consider risk factors for pulmonary embolus. Electronically Signed   By: Van Clines M.D.   On: 04/30/2018 17:50   Dg Chest Port 1 View  Result Date: 04/30/2018 CLINICAL DATA:  Shortness of breath. EXAM: PORTABLE CHEST 1 VIEW COMPARISON:  04/29/2018 FINDINGS: Endotracheal and enteric tubes have been removed. Right PICC terminates over the lower SVC. An ICD remains in place. Aeration of the lung bases has improved, however there are persistent patchy bibasilar airspace opacities as well as hazy opacities suggesting persistent small pleural effusions. No pneumothorax is identified. IMPRESSION: Interval extubation with improved aeration of the lung bases. Persistent small pleural effusions. Electronically Signed   By: Logan Bores M.D.   On: 04/30/2018 08:11   Dg Abd Portable 1v  Result Date: 04/30/2018 CLINICAL DATA:  Evaluate OG tube placement EXAM: PORTABLE ABDOMEN - 1 VIEW COMPARISON:  None. FINDINGS: The OG tube terminates in the stomach. IMPRESSION: The OG tube terminates in the stomach. Electronically Signed   By: Dorise Bullion III M.D   On: 04/30/2018 23:38    Scheduled Meds: . aspirin  81 mg Per Tube Daily  . atorvastatin  40 mg Per Tube Daily  . chlorhexidine gluconate (MEDLINE KIT)  15 mL Mouth Rinse BID  . clopidogrel  75 mg Per Tube Daily  . feeding supplement (VITAL HIGH PROTEIN)  1,000 mL Per Tube Q24H  . insulin aspart  0-9 Units Subcutaneous Q6H  . ipratropium  0.5 mg Nebulization Q4H  . lipase/protease/amylase  12,000 Units Oral Q8H  . mouth rinse  15 mL Mouth Rinse 10 times per day  . methylPREDNISolone (SOLU-MEDROL) injection  40  mg Intravenous Q12H  . metoprolol tartrate  5 mg Intravenous Q6H  . pantoprazole (PROTONIX) IV  40 mg Intravenous Daily  . sodium chloride flush  10-40 mL Intracatheter Q12H   Continuous Infusions: . amiodarone 60 mg/hr (05/01/18 1101)  . ampicillin-sulbactam (UNASYN) IV 3 g (05/01/18 1227)  . famotidine (PEPCID) IV Stopped (05/01/18 1103)  . heparin 900 Units/hr (05/01/18 1200)  . phenylephrine (NEO-SYNEPHRINE) Adult infusion Stopped (04/29/18 1642)  . propofol (DIPRIVAN) infusion 50 mcg/kg/min (05/01/18 1346)    Assessment/Plan:  1. Septic shock secondary to pneumonia.  Currently on Unasyn.  Off pressors currently. 2. Acute hypoxic respiratory failure with respiratory acidosis.  Reintubated last night secondary to agitation and difficulty oxygenating. 3. Atrial fibrillation with rapid ventricular response.  Echocardiogram showed an EF of 40 to 45%.  Continue amiodarone and heparin drip 4. Acute encephalopathy.  CT scan negative today.  Need continued monitoring on mental status. 5. Elevated troponin secondary to demand ischemia with septic shock.  On aspirin. 6. Acute kidney injury on chronic kidney disease stage III.  Creatinine continued to improve to 1.55 7. Pancreatic insufficiency on pancreatic enzymes 8. Anemia of chronic disease.  Hemoglobin today 9.0 9. Hyperlipidemia unspecified on Lipitor 10. History of prostate cancer 11. History  of chronic combined systolic diastolic congestive heart failure.  Patient given a dose of Lasix.  Code Status:     Code Status Orders  (From admission, onward)         Start     Ordered   04/26/18 0254  Full code  Continuous     04/26/18 0253        Code Status History    This patient has a current code status but no historical code status.     Family Communication: Spoke with family at bedside yesterday Disposition Plan: To be determined  Consultants:  Critical care  specialist  Cardiology  Nephrology  Antibiotics: -Unasyn  Time spent: 26 minutes.  Case discussed with nursing staff  [facility]

## 2018-05-01 NOTE — Progress Notes (Signed)
Nutrition Follow-up  INTERVENTION:   Initiate tube feeding via OG tube: - Vital High Protein @ 55 ml/hr (1320 ml/day goal volume)  Tube feeding regimen provides 1320 kcal, 116 grams of protein, and 1109 ml of H2O.   Tube feeding regimen and current propofol provides 2001 total kcal (99% of needs).  Patient is on Creon for exocrine pancreatic insufficiency. To provide per tube recommend opening capsule and pouring beads into 50 mL of nectar-thick apple juice. Provide mixture by gravity with a 60 mL syringe. Flush syringe with 50-60 mL free water before and after Creon provision to help maintain tube patency.  NUTRITION DIAGNOSIS:   Inadequate oral intake related to inability to eat as evidenced by NPO status.  Ongoing  GOAL:   Patient will meet greater than or equal to 90% of their needs  Unmet.  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  11/13 - extubated 11/14 - reintubated after given some ice chips (questionable aspiration)  RD to use weight on admission (81 kg) when calculating estimated needs due to positive fluid balance of 10.2 L. Weight has trended up since admission.  Discussed pt with RN. RD ordered ICU tube feeding protocol.  Patient is currently intubated on ventilator support. Pt with OG tube in stomach, currently clamped. MVe: 9.3 L/min Temp (24hrs), Avg:97.6 F (36.4 C), Min:97 F (36.1 C), Max:98.2 F (36.8 C)  Propofol: 25.8 ml/hr (provides 681 kcal/day) Amiodarone: 33.3 ml/hr Heparin: 9 ml/hr  Medications reviewed and include:  SSI, Creon 12,000 units q 8 hours, Protonix, IV Unasyn, IV Pepcid 20 mg q 12 hours  Labs reviewed: potassium 3.4 (L), BUN 40 (H), creatinine 1.55 (H) CBG's: 105, 116, 100, 360, 181, 133, 270 x 12 hours  UOP: 1555 ml x 24 hours I/O's: +10.2 L since admit  Diet Order:   Diet Order            Diet NPO time specified  Diet effective now              EDUCATION NEEDS:   Not appropriate for education at this time  Skin:  Skin Assessment: Reviewed RN Assessment  Last BM:  11/15 (small type 6)  Height:   Ht Readings from Last 1 Encounters:  04/26/18 5\' 11"  (1.803 m)    Weight:   Wt Readings from Last 1 Encounters:  04/30/18 86 kg    Ideal Body Weight:  78.2 kg  BMI:  Body mass index is 26.44 kg/m.  Estimated Nutritional Needs:   Kcal:  2027 (PSU 2003b using EDW of 81 kg)  Protein:  100-125 grams (1.2-1.5 grams/kg)  Fluid:  2 L/day (25 mL/kg)    Gaynell Face, MS, RD, LDN Inpatient Clinical Dietitian Pager: [phone] Weekend/After Hours: [phone]

## 2018-05-01 NOTE — Progress Notes (Signed)
[place] for heparin dosing for this 26 YOM admitted on 11/10 with acute AMS/delirium and suspected pneumonia. Developed AFRVR on 11/11 requiring anticoagulation.  Indication: Anticoagulation-Afib   No Known Allergies  Patient Measurements: Height: 5\' 11"  (180.3 cm) Weight: 189 lb 9.5 oz (86 kg) IBW/kg (Calculated) : 75.3 Heparin Dosing Weight: 83.4 kg (actual BW)  Vital Signs: Temp: 98.3 F (36.8 C) (11/15 1200) Temp Source: Oral (11/15 1200) BP: 158/79 (11/15 1600) Pulse Rate: 87 (11/15 1600)  Labs: Recent Labs    04/29/18 0302  04/30/18 0423 04/30/18 2359 05/01/18 0359 05/01/18 1537  HGB 9.3*  --  8.5* 8.2* 9.0*  --   HCT 28.6*  --  26.3* 25.0* 26.8*  --   PLT 146*  --  126* 124* 132*  --   LABPROT 14.3  --   --   --   --   --   INR 1.12  --   --   --   --   --   HEPARINUNFRC  --    < > 0.48  --  1.36* 0.28*  CREATININE 2.05*   < > 1.52* 1.67* 1.55*  --    < > = values in this interval not displayed.    Estimated Creatinine Clearance: 43.2 mL/min (A) (by C-G formula based on SCr of 1.55 mg/dL (H)).   Medical History: Past Medical History:  Diagnosis Date  . Abdominal aortic aneurysm (AAA) ([place]) 05/13/15   seen on ct scan  . 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 Anemia   . 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 CAD (coronary artery disease)   . Cataract cortical, senile   . CHF (congestive heart failure) ([place])   . Chronic hoarseness   . Exocrine pancreatic insufficiency   . H. pylori infection   . History of hepatitis   . Hyperlipidemia   . Hypertension   . 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 Prostate [facility])     Assessment: Patient will require anticoagulation for stroke prevention related to newly developed A-fib. CHA2DS2-VASc is at least 4 (age >31, HTN, CHF). Patient currently receiving heparin at 900 units/hr.   Goal of Therapy:  Heparin level goal = 0.3-0.7  Plan:  Heparin level elevated with  am labs; heparin level now 0.28. Will defer bolus and increase rate to 1050 units/hr. Will obtain follow heparin level at 0030.   Pharmacy will continue to monitor and adjust per consult.   , L 05/01/2018 4:35 PM

## 2018-05-01 NOTE — Progress Notes (Signed)
[place] for heparin dosing for this 70 YOM admitted on 11/10 with acute AMS/delirium and suspected pneumonia. Developed AFRVR on 11/11 requiring anticoagulation.  Indication: Anticoagulation-Afib   No Known Allergies  Patient Measurements: Height: 5\' 11"  (180.3 cm) Weight: 189 lb 9.5 oz (86 kg) IBW/kg (Calculated) : 75.3 Heparin Dosing Weight: 83.4 kg (actual BW)  Vital Signs: Temp: 97 F (36.1 C) (11/15 0400) Temp Source: Oral (11/15 0400) BP: 128/65 (11/15 0500) Pulse Rate: 59 (11/15 0500)  Labs: Recent Labs    04/29/18 0302  04/29/18 2025 04/30/18 0423 04/30/18 2359 05/01/18 0359  HGB 9.3*  --   --  8.5* 8.2* 9.0*  HCT 28.6*  --   --  26.3* 25.0* 26.8*  PLT 146*  --   --  126* 124* 132*  LABPROT 14.3  --   --   --   --   --   INR 1.12  --   --   --   --   --   HEPARINUNFRC  --   --  0.49 0.48  --  1.36*  CREATININE 2.05*   < >  --  1.52* 1.67* 1.55*   < > = values in this interval not displayed.    Estimated Creatinine Clearance: 43.2 mL/min (A) (by C-G formula based on SCr of 1.55 mg/dL (H)).   Medical History: Past Medical History:  Diagnosis Date  . Abdominal aortic aneurysm (AAA) ([place]) 05/13/15   seen on ct scan  . 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 Anemia   . 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 CAD (coronary artery disease)   . Cataract cortical, senile   . CHF (congestive heart failure) ([place])   . Chronic hoarseness   . Exocrine pancreatic insufficiency   . H. pylori infection   . History of hepatitis   . Hyperlipidemia   . Hypertension   . 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 Prostate CA [facility])     Assessment: Patient will require anticoagulation for stroke prevention related to newly developed A-fib. CHA2DS2-VASc is at least 4 (age >44, HTN, CHF). Patient was given heparin 5000 Units SubQ Q8h. Last dose given 11/13 @ 0538.   Goal of Therapy:  Heparin level goal = 0.3-0.7  Plan:  11/14  0423: heparin level 0.48. Continue current regimen - heparin 1200 Units/hr. Recheck heparin level and CBC with tomorrow AM labs.  11/15 0400 HL 1.36. Hold drip x 1 hour and restart at 900 units/hr. Recheck 8 hours after restart.  Monitor heparin level and CBC/platelets daily.   Eloise Harman, PharmD student  05/01/2018 5:57 AM

## 2018-05-01 NOTE — Progress Notes (Signed)
Pts 0400 BG was 360 off central line and 100 when finger stick performed. Darel Hong, NP notified of big discrepancy . Another BG was checked when arterial gas obtained by RT and result was 116. Per Jeremiah,NP  follow finger sticks from now on for sliding scale.

## 2018-05-01 NOTE — Progress Notes (Signed)
MEDICATION RELATED CONSULT NOTE - FOLLOW UP   Pharmacy Consult for Amiodarone DDIs Indication: A-fib  No Known Allergies  Patient Measurements: Height: 5\' 11"  (180.3 cm) Weight: 189 lb 9.5 oz (86 kg) IBW/kg (Calculated) : 75.3   Vital Signs: Temp: 98.1 F (36.7 C) (11/15 0800) Temp Source: Oral (11/15 0800) BP: 124/81 (11/15 0900) Pulse Rate: 103 (11/15 0900) Intake/Output from previous day: 11/14 0701 - 11/15 0700 In: 2392.2 [I.V.:1446.2; IV Piggyback:946] Out: 1027 [OZDGU:4403] Intake/Output from this shift: Total I/O In: 61.8 [I.V.:61.8] Out: 100 [Urine:100]  Labs: Recent Labs    04/29/18 0302 04/29/18 1000 04/29/18 1725 04/30/18 0423 04/30/18 2359 05/01/18 0359  WBC 17.5*  --   --  9.7 10.8* 10.4  HGB 9.3*  --   --  8.5* 8.2* 9.0*  HCT 28.6*  --   --  26.3* 25.0* 26.8*  PLT 146*  --   --  126* 124* 132*  CREATININE 2.05*  --  1.70* 1.52* 1.67* 1.55*  LABCREA  --  34  --   --   --   --   MG 2.0  --  1.9 2.1  --   --   ALBUMIN 2.6*  --   --  2.7*  --   --   PROT 5.5*  --   --  5.9*  --   --   AST 66*  --   --  52*  --   --   ALT 30  --   --  35  --   --   ALKPHOS 58  --   --  59  --   --   BILITOT 0.6  --   --  0.6  --   --    Estimated Creatinine Clearance: 43.2 mL/min (A) (by C-G formula based on SCr of 1.55 mg/dL (H)).   Assessment/Plan:  QT interval 450 on ECG 11/14. Fentanyl IV x 1 dose, Haloperidol IV q6h PRN were initiated on 11/14. No new pertinent medication ordered 11/15.   Drug-drug interactions identified: Haloperidol-amiodarone: QTc prolongation; monitor for now as haloperidol ordered as q6h PRN. Patient received 1 dose 11/14.  Continue current medications. Pharmacy will continue to monitor.   Thank you for allowing pharmacy to be a part of this patient's care.  Ocie Doyne, pharmacy student  05/01/2018,10:37 AM

## 2018-05-02 DIAGNOSIS — N189 Chronic kidney disease, unspecified: Secondary | ICD-10-CM

## 2018-05-02 LAB — MAGNESIUM
MAGNESIUM: 2.1 mg/dL (ref 1.7–2.4)
Magnesium: 2.3 mg/dL (ref 1.7–2.4)

## 2018-05-02 LAB — GLUCOSE, CAPILLARY
GLUCOSE-CAPILLARY: 202 mg/dL — AB (ref 70–99)
GLUCOSE-CAPILLARY: 182 mg/dL — AB (ref 70–99)
Glucose-Capillary: 153 mg/dL — ABNORMAL HIGH (ref 70–99)
Glucose-Capillary: 152 mg/dL — ABNORMAL HIGH (ref 70–99)
Glucose-Capillary: 170 mg/dL — ABNORMAL HIGH (ref 70–99)
Glucose-Capillary: 190 mg/dL — ABNORMAL HIGH (ref 70–99)

## 2018-05-02 LAB — BASIC METABOLIC PANEL
ANION GAP: 15 (ref 5–15)
BUN: 52 mg/dL — ABNORMAL HIGH (ref 8–23)
CALCIUM: 8.8 mg/dL — AB (ref 8.9–10.3)
CO2: 27 mmol/L (ref 22–32)
Chloride: 103 mmol/L (ref 98–111)
Creatinine, Ser: 1.96 mg/dL — ABNORMAL HIGH (ref 0.61–1.24)
GFR, EST AFRICAN AMERICAN: 36 mL/min — AB (ref >60)
GFR, EST NON AFRICAN AMERICAN: 31 mL/min — AB (ref >60)
Glucose, Bld: 151 mg/dL — ABNORMAL HIGH (ref 70–99)
Potassium: 3.9 mmol/L (ref 3.5–5.1)
SODIUM: 145 mmol/L (ref 135–145)

## 2018-05-02 LAB — BLOOD GAS, ARTERIAL
ACID-BASE EXCESS: 2.9 mmol/L — AB (ref 0.0–2.0)
BICARBONATE: 27.9 mmol/L (ref 20.0–28.0)
FIO2: 0.36
O2 SAT: 98.4 %
Patient temperature: 37
pCO2 arterial: 43 mmHg (ref 32.0–48.0)
pH, Arterial: 7.42 (ref 7.350–7.450)
pO2, Arterial: 110 mmHg — ABNORMAL HIGH (ref 83.0–108.0)

## 2018-05-02 LAB — HEPARIN LEVEL (UNFRACTIONATED)
Heparin Unfractionated: 0.35 IU/mL (ref 0.30–0.70)
Heparin Unfractionated: 0.48 IU/mL (ref 0.30–0.70)

## 2018-05-02 LAB — CBC
HEMATOCRIT: 30.5 % — AB (ref 39.0–52.0)
Hemoglobin: 9.9 g/dL — ABNORMAL LOW (ref 13.0–17.0)
MCH: 29 pg (ref 26.0–34.0)
MCHC: 32.5 g/dL (ref 30.0–36.0)
MCV: 89.4 fL (ref 80.0–100.0)
PLATELETS: 214 10*3/uL (ref 150–400)
RBC: 3.41 MIL/uL — AB (ref 4.22–5.81)
RDW: 15.2 % (ref 11.5–15.5)
WBC: 14.5 10*3/uL — ABNORMAL HIGH (ref 4.0–10.5)
nRBC: 0 % (ref 0.0–0.2)

## 2018-05-02 LAB — PHOSPHORUS
PHOSPHORUS: 3.9 mg/dL (ref 2.5–4.6)
Phosphorus: 3.5 mg/dL (ref 2.5–4.6)

## 2018-05-02 MED ORDER — BUDESONIDE 0.5 MG/2ML IN SUSP
0.5000 mg | Freq: Two times a day (BID) | RESPIRATORY_TRACT | Status: DC
  Administered 2018-05-02 – 2018-05-13 (×23): 0.5 mg via RESPIRATORY_TRACT
  Filled 2018-05-02 (×23): qty 2

## 2018-05-02 MED ORDER — QUETIAPINE FUMARATE 25 MG PO TABS
100.0000 mg | ORAL_TABLET | Freq: Two times a day (BID) | ORAL | Status: DC
  Administered 2018-05-02 – 2018-05-13 (×21): 100 mg via ORAL
  Filled 2018-05-02: qty 1
  Filled 2018-05-02 (×6): qty 4
  Filled 2018-05-02 (×2): qty 1
  Filled 2018-05-02: qty 4
  Filled 2018-05-02: qty 1
  Filled 2018-05-02 (×5): qty 4
  Filled 2018-05-02: qty 1
  Filled 2018-05-02 (×3): qty 4
  Filled 2018-05-02 (×3): qty 1

## 2018-05-02 MED ORDER — OXYCODONE-ACETAMINOPHEN 5-325 MG PO TABS
1.0000 | ORAL_TABLET | Freq: Four times a day (QID) | ORAL | Status: DC
  Administered 2018-05-02 – 2018-05-07 (×15): 1 via ORAL
  Filled 2018-05-02 (×16): qty 1

## 2018-05-02 MED ORDER — IPRATROPIUM-ALBUTEROL 0.5-2.5 (3) MG/3ML IN SOLN
3.0000 mL | RESPIRATORY_TRACT | Status: DC
  Administered 2018-05-02 – 2018-05-06 (×23): 3 mL via RESPIRATORY_TRACT
  Filled 2018-05-02 (×25): qty 3

## 2018-05-02 NOTE — Progress Notes (Signed)
Matthew Zamora for heparin dosing for this 47 YOM admitted on 11/10 with acute AMS/delirium and suspected pneumonia. Developed AFRVR on 11/11 requiring anticoagulation.  Indication: Anticoagulation-Afib   No Known Allergies  Patient Measurements: Height: 5\' 11"  (180.3 cm) Weight: 184 lb 15.5 oz (83.9 kg) IBW/kg (Calculated) : 75.3 Heparin Dosing Weight: 83.4 kg (actual BW)  Vital Signs: Temp: 99.5 F (37.5 C) (11/16 0800) Temp Source: Oral (11/16 0800) BP: 92/54 (11/16 1130) Pulse Rate: 140 (11/16 1130)  Labs: Recent Labs    04/30/18 2359 05/01/18 0359 05/01/18 1537 05/02/18 0201 05/02/18 1038  HGB 8.2* 9.0*  --   --  9.9*  HCT 25.0* 26.8*  --   --  30.5*  PLT 124* 132*  --   --  214  HEPARINUNFRC  --  1.36* 0.28* 0.35 0.48  CREATININE 1.67* 1.55*  --  1.96*  --     Estimated Creatinine Clearance: 34.1 mL/min (A) (by C-G formula based on SCr of 1.96 mg/dL (H)).   Medical History: Past Medical History:  Diagnosis Date  . Abdominal aortic aneurysm (AAA) ([place]) 05/13/15   seen on ct scan  . 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 Anemia   . 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 CAD (coronary artery disease)   . Cataract cortical, senile   . CHF (congestive heart failure) ([place])   . Chronic hoarseness   . Exocrine pancreatic insufficiency   . H. pylori infection   . History of hepatitis   . Hyperlipidemia   . Hypertension   . 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 Prostate CA [facility])     Assessment: Patient will require anticoagulation for stroke prevention related to newly developed A-fib. CHA2DS2-VASc is at least 4 (age >10, HTN, CHF). Patient currently receiving heparin at 900 units/hr.   Goal of Therapy:  Heparin level goal = 0.3-0.7  Plan:  11/16 @ 0201 HL 0.35. Level is therapeutic. Will continue current infusion rate of 1050 units/hr. Recheck confirmatory HL and CBC in 8 hours.   11/16 @ 10:38 HL = 0.48. Continue  current drip rate. Recheck HL with am labs.   Pharmacy will continue to monitor and adjust per consult.   Olivia Canter [facility] Clinical Pharmacist 05/02/2018 12:21 PM

## 2018-05-02 NOTE — Progress Notes (Signed)
Progress Note  Patient Name: Matthew Zamora Date of Encounter: 05/02/2018  Primary Cardiologist: [facility]  Subjective   Remains intubated and sedated.  [facility] has refused transfer.  Back in Afib with RVR this morning at approximately 11:05 AM.  Remains on full dose IV amiodarone.  Potassium improved to 3.9.  Renal function worsening from 1.55-1.96.  Magnesium 2.3.  No family present this morning.  Inpatient Medications    Scheduled Meds: . aspirin  81 mg Per Tube Daily  . atorvastatin  40 mg Per Tube Daily  . budesonide (PULMICORT) nebulizer solution  0.5 mg Nebulization BID  . chlorhexidine gluconate (MEDLINE KIT)  15 mL Mouth Rinse BID  . clopidogrel  75 mg Per Tube Daily  . feeding supplement (VITAL HIGH PROTEIN)  1,000 mL Per Tube Q24H  . furosemide  40 mg Intravenous Q12H  . insulin aspart  0-9 Units Subcutaneous Q4H  . ipratropium-albuterol  3 mL Nebulization Q4H  . lipase/protease/amylase  12,000 Units Oral Q8H  . mouth rinse  15 mL Mouth Rinse 10 times per day  . methylPREDNISolone (SOLU-MEDROL) injection  40 mg Intravenous Q12H  . metoprolol tartrate  5 mg Intravenous Q6H  . oxyCODONE-acetaminophen  1 tablet Oral Q6H  . pantoprazole (PROTONIX) IV  40 mg Intravenous Daily  . QUEtiapine  100 mg Oral BID  . sodium chloride flush  10-40 mL Intracatheter Q12H   Continuous Infusions: . amiodarone 60 mg/hr (05/02/18 1113)  . ampicillin-sulbactam (UNASYN) IV 200 mL/hr at 05/02/18 1000  . heparin 1,050 Units/hr (05/02/18 1000)  . propofol (DIPRIVAN) infusion 50 mcg/kg/min (05/02/18 1243)   PRN Meds: acetaminophen **OR** acetaminophen, bisacodyl, bisacodyl, fentaNYL (SUBLIMAZE) injection, fentaNYL (SUBLIMAZE) injection, haloperidol lactate, midazolam, ondansetron **OR** ondansetron (ZOFRAN) IV, sennosides, sodium chloride flush   Vital Signs    Vitals:   05/02/18 1030 05/02/18 1100 05/02/18 1130 05/02/18 1200  BP: (!) 179/74 (!) 176/85 (!) 92/54 109/78  Pulse: 84 85  (!) 140 (!) 133  Resp: _0 Temp:    98.3 F (36.8 C)  TempSrc:    Oral  SpO2: 100% 99% 96% 97%  Weight:      Height:        Intake/Output Summary (Last 24 hours) at 05/02/2018 1301 Last data filed at 05/02/2018 1220 Gross per 24 hour  Intake 6381.18 ml  Output 4160 ml  Net 2221.18 ml   Filed Weights   04/29/18 0305 04/30/18 0500 05/02/18 0500  Weight: 83.4 kg 86 kg 83.9 kg    Telemetry    Developed frequent PACs followed by short runs of narrow complex tachycardia at approximately 1105 and ultimately developed A. fib with RVR shortly thereafter and has remained in A. fib with RVR with heart rates in the low 100s to 120s bpm- Personally Reviewed  ECG    n/a - Personally Reviewed  Physical Exam   GEN:  Critically ill-appearing.   Neck: JVD difficult to assess secondary to mechanical ventilation. Cardiac:  Tachycardic, irregularly irregular, no murmurs, rubs, or gallops.  Respiratory:  Diminished breath sounds bilaterally.  Intubated. GI: Soft, nontender, non-distended.   MS: No edema; No deformity. Neuro:   Intubated and sedated.  Psych: Intubated and sedated.  Labs    Chemistry Recent Labs  Lab 04/28/18 0313 04/29/18 0302  04/30/18 0423 04/30/18 2359 05/01/18 0359 05/02/18 0201  NA 137 140   < > 143 140 141 145  K 4.0 3.5   < > 3.7 3.6 3.4* 3.9  CL 103 104   < > 106 102 100 103  CO2 24 26   < > _0 GLUCOSE 122* 108*   < > 164* 251* 213* 151*  BUN 33* 33*   < > 33* 39* 40* 52*  CREATININE 2.52* 2.05*   < > 1.52* 1.67* 1.55* 1.96*  CALCIUM 8.2* 8.4*   < > 8.5* 8.1* 8.2* 8.8*  PROT 5.8* 5.5*  --  5.9*  --   --   --   ALBUMIN 3.0* 2.6*  --  2.7*  --   --   --   AST 74* 66*  --  52*  --   --   --   ALT 28 30  --  35  --   --   --   ALKPHOS 68 58  --  59  --   --   --   BILITOT 0.6 0.6  --  0.6  --   --   --   GFRNONAA 23* 30*   < > 43* 38* 42* 31*  GFRAA 27* 35*   < > 50* 44* 48* 36*  ANIONGAP 10 10   < > _1 < > = values in  this interval not displayed.     Hematology Recent Labs  Lab 04/30/18 2359 05/01/18 0359 05/02/18 1038  WBC 10.8* 10.4 14.5*  RBC 2.77* 2.98* 3.41*  HGB 8.2* 9.0* 9.9*  HCT 25.0* 26.8* 30.5*  MCV 90.3 89.9 89.4  MCH 29.6 30.2 29.0  MCHC 32.8 33.6 32.5  RDW 14.9 15.0 15.2  PLT 124* 132* 214    Cardiac Enzymes Recent Labs  Lab 04/26/18 0447 04/27/18 1125 04/27/18 1732 04/28/18 0313  TROPONINI 0.16* 0.43* 0.57* 0.44*   No results for input(s): TROPIPOC in the last 168 hours.   BNP Recent Labs  Lab 04/26/18 0030 04/27/18 1054 04/29/18 0302  BNP 717.0* 799.0* 889.0*     DDimer No results for input(s): DDIMER in the last 168 hours.   Radiology    Ct Head Wo Contrast  Result Date: 05/01/2018 IMPRESSION: No evidence of acute intracranial abnormality. Electronically Signed   By: Logan Bores M.D.   On: 05/01/2018 11:33   Dg Chest Port 1 View  Result Date: 05/01/2018 IMPRESSION: 1. Support apparatus as above. 2. Decreasing hazy opacity in the left base. Electronically Signed   By: Dorise Bullion III M.D   On: 05/01/2018 02:41   Dg Chest Port 1 View  Result Date: 04/30/2018 IMPRESSION: 1. Endotracheal tube tip projects 4.1 cm above the carina. Right PICC line tip projects over lower SVC. 2. Increased diffuse reticular and hazy basilar opacities of the lungs, probably worsening pulmonary edema or possibly atypical pneumonia. Electronically Signed   By: Kristine Garbe M.D.   On: 04/30/2018 20:05   Dg Chest Port 1 View  Result Date: 04/30/2018 IMPRESSION: 1. Indistinct pulmonary vasculature with peribronchovascular nodularity increased from prior, but no overt cardiomegaly. Questionable left lower lobe airspace opacity. Possibilities may include atypical infectious process or noncardiogenic edema. Chest CT could be utilized to further characterize if clinically warranted. If the patient has shortness of breath onset was sudden, consider risk factors for  pulmonary embolus. Electronically Signed   By: Van Clines M.D.   On: 04/30/2018 17:50   Dg Abd Portable 1v  Result Date: 04/30/2018 IMPRESSION: The OG tube terminates in the stomach. Electronically Signed   By: Dorise Bullion III M.D  On: 04/30/2018 23:38    Cardiac Studies   Echo 04/27/2018: Study Conclusions  - Left ventricle: The cavity size was normal. There was moderate   focal basal hypertrophy of the septum. Systolic function was   mildly to moderately reduced. The estimated ejection fraction was   in the range of 40% to 45%. Images were inadequate for LV wall   motion assessment. The study is not technically sufficient to   allow evaluation of LV diastolic function. - Mitral valve: There was mild regurgitation. - Left atrium: The atrium was moderately dilated. - Right atrium: The atrium was mildly dilated. - Pulmonary arteries: Systolic pressure was moderately increased.   PA peak pressure: 50 mm Hg (S). - Inferior vena cava: The vessel was dilated. The respirophasic   diameter changes were blunted (< 50%), consistent with elevated   central venous pressure.  Patient Profile     78 y.o. male with history of coronary artery disease, chronic systolic heart failure, ventricular tachycardia status post ICD, and chronic kidney disease stage III,whowas admitted due to acute respiratory failure thought to be due to pneumonia and developed intermittent episodes of atrial fibrillation with rapid ventricular response.  Assessment & Plan    1. Acute respiratory failure with hypoxia and hypercapnia: -Reintubated on 11/14 in the afternoon in the setting of hypoxia and hypercapnia -Management per critical care  2. PAF with RVR: -We developed A. fib with RVR this morning at approximately 11 AM and remains in A. fib with ventricular rates in the low 100s to 120s bpm -Continue IV amiodarone full dose -IV metoprolol -No current indication for emergent cardioversion given  his MAP is 70 -Should he decompensate with proceed with emergent cardioversion -If family is present on rounds on 11/17 will discuss with them the possibility of cardioversion while he is sedated and intubated -Continue IV heparin with plans to transition to oral anticoagulation given his CHADS2VASc of 5 -Once he started on oral anticoagulation will discontinue aspirin  3. CAD with elevated troponin: -Mild troponin bump in the setting of the above -In the setting of his acute on chronic kidney disease and recurrent respiratory failure he has not been felt to be a good candidate for ischemic evaluation at this time -Remains on heparin as above -Continue aspirin and Plavix along with beta-blocker -Resume statin once he is able to take p.o. -He will likely need ischemic evaluation and follow-up  4. HFrEF: -He is up approximately 12 L for the admission -If needed could check CVP's via his PICC line -Continue IV beta-blocker -Has received IV Lasix this morning  5. Acute on CKD stage III: -Possibly in the setting of ATN -Renal function declined with IV Lasix -May need to hold Lasix this afternoon, will discuss with MD  6. Anemia: -HGB improved from 9.0-->9.9 -Monitor -Per primary   7. Hypokalemia: -Improving   For questions or updates, please contact [facility] Please consult [email or web address] for contact info under Cardiology/STEMI.    Signed, Christell Faith, PA-C [place] Pager: [phone] 05/02/2018, 1:01 PM

## 2018-05-02 NOTE — Progress Notes (Signed)
Patient ID: Matthew Zamora, male   DOB: [birth year], 77 y.o.   MRN: [id number]  [facility] PROGRESS NOTE  MELBOURNE JAKUBIAK YKD:[id number] DOB: 11/12/1940 DOA: 04/26/2018 PCP: [place] Pink, MD  HPI/Subjective Currently on ventilator [place] volume 450 Rate 18 PEEP 5 FiO2 30% Unable to give any history  Objective: Vitals:   05/02/18 1100 05/02/18 1130  BP: (!) 176/85 (!) 92/54  Pulse: 85 (!) 140  Resp: 20 17  Temp:    SpO2: 99% 96%    Filed Weights   04/29/18 0305 04/30/18 0500 05/02/18 0500  Weight: 83.4 kg 86 kg 83.9 kg    ROS: Review of Systems  Unable to perform ROS: Intubated   Exam: Physical Exam  HENT:  Nose: No mucosal edema.  Mouth/Throat: No oropharyngeal exudate or posterior oropharyngeal edema.  Unable to look into mouth  Eyes: Conjunctivae and lids are normal.  Neck: No JVD present. Carotid bruit is not present. No edema present. No thyroid mass and no thyromegaly present.  Cardiovascular: Regular rhythm, S1 normal, S2 normal and normal heart sounds. Exam reveals no gallop.  No murmur heard. Pulses:      Dorsalis pedis pulses are 2+ on the right side, and 2+ on the left side.  Respiratory: No respiratory distress. He has decreased breath sounds in the right middle field, the right lower field, the left middle field and the left lower field. He has wheezes in the right middle field and the left middle field. He has rhonchi in the right lower field and the left lower field. He has no rales.  GI: Soft. Bowel sounds are normal. There is no tenderness.  Musculoskeletal:       Right ankle: He exhibits no swelling.       Left ankle: He exhibits no swelling.  Lymphadenopathy:    He has no cervical adenopathy.  Neurological:  Sedated on ventilator  Skin: Skin is warm. No rash noted. Nails show no clubbing.  Psychiatric:  Unable to assess secondary to intubation      Data Reviewed: Basic Metabolic Panel: Recent Labs  Lab 04/27/18 1125   04/28/18 0313  04/29/18 1725 04/30/18 0423 04/30/18 2359 05/01/18 0359 05/01/18 1313 05/01/18 1537 05/01/18 1719 05/02/18 0201  NA 133*   < > 137   < > 142 143 140 141  --   --   --  145  K 2.9*   < > 4.0   < > 3.9 3.7 3.6 3.4*  --   --   --  3.9  CL 104   < > 103   < > 104 106 102 100  --   --   --  103  CO2 18*   < > 24   < > '25 26 28 27  ' --   --   --  27  GLUCOSE 297*   < > 122*   < > 132* 164* 251* 213*  --   --   --  151*  BUN 18   < > 33*   < > 31* 33* 39* 40*  --   --   --  52*  CREATININE 1.26*   < > 2.52*   < > 1.70* 1.52* 1.67* 1.55*  --   --   --  1.96*  CALCIUM 5.1*   < > 8.2*   < > 8.6* 8.5* 8.1* 8.2*  --   --   --  8.8*  MG 1.3*  1.2*  --  1.8   < >  1.9 2.1  --   --  2.3 2.3 2.4 2.3  PHOS 2.4*  --  4.6  --   --   --   --   --  3.5  --  2.8 3.5   < > = values in this interval not displayed.   Liver Function Tests: Recent Labs  Lab 04/26/18 0030 04/28/18 0313 04/29/18 0302 04/30/18 0423  AST 24 74* 66* 52*  ALT '16 28 30 ' 35  ALKPHOS 79 68 58 59  BILITOT 0.7 0.6 0.6 0.6  PROT 7.5 5.8* 5.5* 5.9*  ALBUMIN 4.2 3.0* 2.6* 2.7*   CBC: Recent Labs  Lab 04/26/18 0030  04/29/18 0302 04/30/18 0423 04/30/18 2359 05/01/18 0359 05/02/18 1038  WBC 13.2*   < > 17.5* 9.7 10.8* 10.4 14.5*  NEUTROABS 7.7  --  15.3* 9.0*  --   --   --   HGB 13.0   < > 9.3* 8.5* 8.2* 9.0* 9.9*  HCT 40.5   < > 28.6* 26.3* 25.0* 26.8* 30.5*  MCV 91.4   < > 89.4 88.6 90.3 89.9 89.4  PLT 237   < > 146* 126* 124* 132* 214   < > = values in this interval not displayed.   Cardiac Enzymes: Recent Labs  Lab 04/26/18 0030 04/26/18 0447 04/27/18 1125 04/27/18 1732 04/28/18 0313  TROPONINI 0.03* 0.16* 0.43* 0.57* 0.44*   BNP (last 3 results) Recent Labs    04/26/18 0030 04/27/18 1054 04/29/18 0302  BNP 717.0* 799.0* 889.0*    CBG: Recent Labs  Lab 05/01/18 2111 05/01/18 2315 05/02/18 0340 05/02/18 0750 05/02/18 1154  GLUCAP 156* 148* 153* 152* 170*    Recent Results (from  the past 240 hour(s))  Blood culture (routine x 2)     Status: None   Collection Time: 04/26/18  1:54 AM  Result Value Ref Range Status   Specimen Description BLOOD LEFT FATTY CASTS  Final   Special Requests   Final    BOTTLES DRAWN AEROBIC AND ANAEROBIC Blood Culture adequate volume   Culture   Final    NO GROWTH 5 DAYS Performed at [facility], [address] [place], [place] [address]    Report Status 05/01/2018 FINAL  Final  Blood culture (routine x 2)     Status: None   Collection Time: 04/26/18  1:55 AM  Result Value Ref Range Status   Specimen Description BLOOD LEFT ASSIST CONTROL  Final   Special Requests   Final    BOTTLES DRAWN AEROBIC AND ANAEROBIC Blood Culture results may not be optimal due to an excessive volume of blood received in culture bottles   Culture   Final    NO GROWTH 5 DAYS Performed at [facility], [address] [place], [place] [address]    Report Status 05/01/2018 FINAL  Final  MRSA PCR Screening     Status: None   Collection Time: 04/26/18  2:54 AM  Result Value Ref Range Status   MRSA by PCR NEGATIVE NEGATIVE Final    Comment:        The GeneXpert MRSA Assay (FDA approved for NASAL specimens only), is one component of a comprehensive MRSA colonization surveillance program. It is not intended to diagnose MRSA infection nor to guide or monitor treatment for MRSA infections. Performed at [facility], [address] [place], [place] [address]   Culture, respiratory (non-expectorated)     Status: None   Collection Time: 04/27/18 10:06 AM  Result Value  Ref Range Status   Specimen Description   Final    TRACHEAL ASPIRATE Performed at [facility], [address] [place], [place] [address]    Special Requests   Final    NONE Performed at [facility], [place], [place] [address]    Gram Stain   Final    FEW WBC PRESENT, PREDOMINANTLY PMN RARE GRAM POSITIVE RODS     Culture   Final    FEW Consistent with normal respiratory flora. Performed at [facility], [place] [address] [place], [place] [address]    Report Status 04/29/2018 FINAL  Final  CULTURE, BLOOD (ROUTINE X 2) w Reflex to ID Panel     Status: None (Preliminary result)   Collection Time: 04/28/18 11:52 AM  Result Value Ref Range Status   Specimen Description BLOOD LEFT HAND  Final   Special Requests   Final    BOTTLES DRAWN AEROBIC AND ANAEROBIC Blood Culture results may not be optimal due to an inadequate volume of blood received in culture bottles   Culture   Final    NO GROWTH 4 DAYS Performed at [facility], [address] [place], [place] [address]    Report Status PENDING  Incomplete  Culture, blood (Routine X 2) w Reflex to ID Panel     Status: None (Preliminary result)   Collection Time: 04/28/18  1:47 PM  Result Value Ref Range Status   Specimen Description BLOOD BLOOD LEFT HAND  Final   Special Requests   Final    BOTTLES DRAWN AEROBIC AND ANAEROBIC Blood Culture adequate volume   Culture   Final    NO GROWTH 4 DAYS Performed at [facility], [address] [place], [place] [address]    Report Status PENDING  Incomplete     Studies: Ct Head Wo Contrast  Result Date: 05/01/2018 CLINICAL DATA:  Acute encephalopathy with intermittent agitation. EXAM: CT HEAD WITHOUT CONTRAST TECHNIQUE: Contiguous axial images were obtained from the base of the skull through the vertex without intravenous contrast. COMPARISON:  04/28/2018 FINDINGS: Brain: There is no evidence of acute infarct, intracranial hemorrhage, mass, midline shift, or extra-axial fluid collection. The ventricles and sulci are within normal limits for age. Vascular: Calcified atherosclerosis at the skull base. No hyperdense vessel. Skull: No fracture or focal osseous lesion. Sinuses/Orbits: Bilateral cataract extraction. Visualized paranasal sinuses and mastoid air cells are clear. Other: None.  IMPRESSION: No evidence of acute intracranial abnormality. Electronically Signed   By: Logan Bores M.D.   On: 05/01/2018 11:33   Dg Chest Port 1 View  Result Date: 05/01/2018 CLINICAL DATA:  Shortness of breath EXAM: PORTABLE CHEST 1 VIEW COMPARISON:  April 30, 2018 FINDINGS: The right PICC line, ET tube, and pacemaker leads are stable. The NG tube terminates below today's film. The cardiomediastinal silhouette is stable. Decreasing mild hazy opacity in the left base. No other acute abnormalities. IMPRESSION: 1. Support apparatus as above. 2. Decreasing hazy opacity in the left base. Electronically Signed   By: Dorise Bullion III M.D   On: 05/01/2018 02:41   Dg Chest Port 1 View  Result Date: 04/30/2018 CLINICAL DATA:  77 y/o  M; intubation. EXAM: PORTABLE CHEST 1 VIEW COMPARISON:  04/30/2018 chest radiograph FINDINGS: Right PICC line tip projects over lower SVC. Endotracheal tube tip projects 4.1 cm above the carina. Increased reticular opacities of the lungs and hazy opacification at the lung bases. Probable small effusions. No pneumothorax. Stable cardiomediastinal  silhouette. Two lead AICD noted. No acute osseous abnormality is evident. IMPRESSION: 1. Endotracheal tube tip projects 4.1 cm above the carina. Right PICC line tip projects over lower SVC. 2. Increased diffuse reticular and hazy basilar opacities of the lungs, probably worsening pulmonary edema or possibly atypical pneumonia. Electronically Signed   By: Kristine Garbe M.D.   On: 04/30/2018 20:05   Dg Chest Port 1 View  Result Date: 04/30/2018 CLINICAL DATA:  Shortness of breath EXAM: PORTABLE CHEST 1 VIEW COMPARISON:  04/30/2018 FINDINGS: The AICD appears unchanged. Indistinct pulmonary vasculature with peribronchovascular nodularity increased from prior. Worsened interstitial accentuation. Ill definition of the retrocardiac left lower lobe diaphragm, left lower lobe airspace opacity not excluded. No definite Kerley B  lines. Heart size within normal limits. Right PICC line tip: SVC. IMPRESSION: 1. Indistinct pulmonary vasculature with peribronchovascular nodularity increased from prior, but no overt cardiomegaly. Questionable left lower lobe airspace opacity. Possibilities may include atypical infectious process or noncardiogenic edema. Chest CT could be utilized to further characterize if clinically warranted. If the patient has shortness of breath onset was sudden, consider risk factors for pulmonary embolus. Electronically Signed   By: Van Clines M.D.   On: 04/30/2018 17:50   Dg Abd Portable 1v  Result Date: 04/30/2018 CLINICAL DATA:  Evaluate OG tube placement EXAM: PORTABLE ABDOMEN - 1 VIEW COMPARISON:  None. FINDINGS: The OG tube terminates in the stomach. IMPRESSION: The OG tube terminates in the stomach. Electronically Signed   By: Dorise Bullion III M.D   On: 04/30/2018 23:38    Scheduled Meds: . aspirin  81 mg Per Tube Daily  . atorvastatin  40 mg Per Tube Daily  . budesonide (PULMICORT) nebulizer solution  0.5 mg Nebulization BID  . chlorhexidine gluconate (MEDLINE KIT)  15 mL Mouth Rinse BID  . clopidogrel  75 mg Per Tube Daily  . feeding supplement (VITAL HIGH PROTEIN)  1,000 mL Per Tube Q24H  . furosemide  40 mg Intravenous Q12H  . insulin aspart  0-9 Units Subcutaneous Q4H  . ipratropium-albuterol  3 mL Nebulization Q4H  . lipase/protease/amylase  12,000 Units Oral Q8H  . mouth rinse  15 mL Mouth Rinse 10 times per day  . methylPREDNISolone (SOLU-MEDROL) injection  40 mg Intravenous Q12H  . metoprolol tartrate  5 mg Intravenous Q6H  . oxyCODONE-acetaminophen  1 tablet Oral Q6H  . pantoprazole (PROTONIX) IV  40 mg Intravenous Daily  . QUEtiapine  100 mg Oral BID  . sodium chloride flush  10-40 mL Intracatheter Q12H   Continuous Infusions: . amiodarone 60 mg/hr (05/02/18 1113)  . ampicillin-sulbactam (UNASYN) IV 200 mL/hr at 05/02/18 1000  . heparin 1,050 Units/hr (05/02/18  1000)  . propofol (DIPRIVAN) infusion 35 mcg/kg/min (05/02/18 1000)    Assessment/Plan:  1. Septic shock secondary to pneumonia.  Currently on Unasyn.  Off pressors currently. 2. Acute hypoxic respiratory failure with respiratory acidosis.  Continue mechanical ventilation.  Weaning trials as per ICU attending 3. Atrial fibrillation with rapid ventricular response.  Echocardiogram showed an EF of 40 to 45%.  Continue amiodarone and heparin drip 4. Acute encephalopathy.  CT scan negative today.  Need continued monitoring on mental status. 5. Elevated troponin secondary to demand ischemia with septic shock.  On aspirin. 6. Acute kidney injury on chronic kidney disease stage III.  Creatinine continued to improve to 1.55 7. Pancreatic insufficiency on pancreatic enzymes 8. Anemia of chronic disease.  Hemoglobin today 9.0 9. Hyperlipidemia unspecified on Lipitor 10. History of prostate cancer  11. History of chronic combined systolic diastolic congestive heart failure.  Patient given a dose of Lasix. 12. Family requested transfer to [facility].  Critical care attending working on possible transfer to [facility] as requested by family  Code Status:     Code Status Orders  (From admission, onward)         Start     Ordered   04/26/18 0254  Full code  Continuous     04/26/18 0253        Code Status History    This patient has a current code status but no historical code status.     Family Communication: Spoke with family at bedside yesterday Disposition Plan: To be determined  Consultants:  Critical care specialist  Cardiology  Nephrology  Antibiotics: -Unasyn  Time spent: 31 minutes.  Case discussed with nursing staff  [facility]

## 2018-05-02 NOTE — Progress Notes (Signed)
I have Contacted [facility] Dr Sima Matas with ICU to assess patient for transfer.  After case review, and At this time, there is Nothing that they can offer to patient and they will NOT accept patient for transfer.     Corrin Parker, M.D.  [facility]  Medical Director [place] Director [facility] Cardio-Pulmonary Department

## 2018-05-02 NOTE — Progress Notes (Signed)
CRITICAL CARE NOTE  CC  follow up respiratory failure  SUBJECTIVE Patient remains critically ill Prognosis is guarded Severe resp failure On vent Remains delerious  SIGNIFICANT STUDIES/EVENTS:  11/10 Echcoardiogram: LVEF 40-45%.  Mild MR.  LA moderately dilated.  RVSP estimate 50 mmHg 11/11 Developed AFRVR. NE changed to PE. Amiodarone infusion intitiated.  11/12 Severe intermittent agitation. Propofol initiated, ABG improved with vent adjustment, head CT done negative 11/13: Doing okay, last night went into A. fib with slightly higher rate in 100s, increase amnio dose, SBT and extubated agitation required Precedex 11/14: Doing well, ABG and everything else acceptable, slight drop in platelet to 126, neurology consult requested because of ongoing agitation 1115 patient was reintubated last night because of possible aspiration, doing well ABG and all other numbers acceptable neurology planning for another head CT  CULTURES:  MRSA PCR 11/10 >> NEG Resp 11/11 >> G PR Blood 11/10 >>  Blood: 04/28/2018 Urine strep Ag 11/11 >>  Urine Legionella Ag 11/11 >>   ANTIBIOTICS:  Azithromycin 11/10 >> stopped at 04/28/2018 Ceftriaxone 11/10 >> stopped at 04/28/2018 Vancomycin: 04/28/2018-04/30/2018 Unasyn: 04/28/2018-    LINES/TUBES:  ETT 11/10 >>  RUE PICC 11/10 >>    BP (!) 173/85 (BP Location: Left Arm)   Pulse 74   Temp 99.5 F (37.5 C) (Oral)   Resp 19   Ht '5\' 11"'  (1.803 m)   Wt 83.9 kg   SpO2 100%   BMI 25.80 kg/m    REVIEW OF SYSTEMS  PATIENT IS UNABLE TO PROVIDE COMPLETE REVIEW OF SYSTEMS DUE TO SEVERE CRITICAL ILLNESS   PHYSICAL EXAMINATION:  GENERAL:critically ill appearing, +resp distress HEAD: Normocephalic, atraumatic.  EYES: Pupils equal, round, reactive to light.  No scleral icterus.  MOUTH: Moist mucosal membrane. NECK: Supple. No thyromegaly. No nodules. No JVD.  PULMONARY: +rhonchi, +wheezing CARDIOVASCULAR: S1 and S2. Regular rate and rhythm. No  murmurs, rubs, or gallops.  GASTROINTESTINAL: Soft, nontender, -distended. No masses. Positive bowel sounds. No hepatosplenomegaly.  MUSCULOSKELETAL: No swelling, clubbing, or edema.  NEUROLOGIC: obtunded, GCS<8 SKIN:intact,warm,dry      Indwelling Urinary Catheter continued, requirement due to   Reason to continue Indwelling Urinary Catheter for strict Intake/Output monitoring for hemodynamic instability   Central Line continued, requirement due to   Reason to continue [employer] Monitoring of central venous pressure or other hemodynamic parameters   Ventilator continued, requirement due to, resp failure    Ventilator Sedation RASS 0 to -2     ASSESSMENT AND PLAN SYNOPSIS  77 yo WM with recurrent bouts of resp failure from aspiration pneumonia and CHF exacerbation  complicated by severe agitation and delerium   Severe Hypoxic and Hypercapnic Respiratory Failure -continue Full MV support -continue Bronchodilator Therapy -Wean Fio2 and PEEP as tolerated -will perform SAT/SBT when respiratory parameters are met   CARDIAC FAILURE- -oxygen as needed -Lasix as tolerated -follow up cardiac enzymes as indicated   Renal Failure-most likely due to ATN -follow chem 7 -follow UO -continue Foley Catheter-assess need daily   NEUROLOGY - intubated and sedated - minimal sedation to achieve a RASS goal: -1 Wake up assessment pending     CARDIAC ICU monitoring  ID -continue IV abx as prescibed -follow up cultures  GI/Nutrition GI PROPHYLAXIS as indicated DIET-->TF's as tolerated Constipation protocol as indicated  ENDO - ICU hypoglycemic\Hyperglycemia protocol -check FSBS per protocol   ELECTROLYTES -follow labs as needed -replace as needed -pharmacy consultation and following   DVT/GI PRX ordered TRANSFUSIONS AS NEEDED MONITOR FSBS ASSESS  the need for LABS as needed   Critical Care Time devoted to patient care services described in this note is  45 minutes.   Overall, patient is critically ill, prognosis is guarded.  Patient with Multiorgan failure and at high risk for cardiac arrest and death.    Corrin Parker, M.D.  [facility]  Medical Director [place] Director [facility] Cardio-Pulmonary Department

## 2018-05-02 NOTE — Plan of Care (Signed)
Patient remains on ventilator.  Patient had multiple episodes of fighting vent, coughing, clenching teeth, gripping ETT.  PRN versed given for agitation. Good urine output.  Medications given and tolerated well. Will continue to monitor.

## 2018-05-02 NOTE — Progress Notes (Signed)
[place] for heparin dosing for this 54 YOM admitted on 11/10 with acute AMS/delirium and suspected pneumonia. Developed AFRVR on 11/11 requiring anticoagulation.  Indication: Anticoagulation-Afib   No Known Allergies  Patient Measurements: Height: 5\' 11"  (180.3 cm) Weight: 189 lb 9.5 oz (86 kg) IBW/kg (Calculated) : 75.3 Heparin Dosing Weight: 83.4 kg (actual BW)  Vital Signs: Temp: 99.6 F (37.6 C) (11/16 0000) Temp Source: Axillary (11/16 0000) BP: 146/62 (11/16 0100) Pulse Rate: 74 (11/16 0100)  Labs: Recent Labs    04/30/18 0423 04/30/18 2359 05/01/18 0359 05/01/18 1537 05/02/18 0201  HGB 8.5* 8.2* 9.0*  --   --   HCT 26.3* 25.0* 26.8*  --   --   PLT 126* 124* 132*  --   --   HEPARINUNFRC 0.48  --  1.36* 0.28* 0.35  CREATININE 1.52* 1.67* 1.55*  --  1.96*    Estimated Creatinine Clearance: 34.1 mL/min (A) (by C-G formula based on SCr of 1.96 mg/dL (H)).   Medical History: Past Medical History:  Diagnosis Date  . Abdominal aortic aneurysm (AAA) ([place]) 05/13/15   seen on ct scan  . 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 Anemia   . 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 CAD (coronary artery disease)   . Cataract cortical, senile   . CHF (congestive heart failure) ([place])   . Chronic hoarseness   . Exocrine pancreatic insufficiency   . H. pylori infection   . History of hepatitis   . Hyperlipidemia   . Hypertension   . 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 Prostate CA [facility])     Assessment: Patient will require anticoagulation for stroke prevention related to newly developed A-fib. CHA2DS2-VASc is at least 4 (age >55, HTN, CHF). Patient currently receiving heparin at 900 units/hr.   Goal of Therapy:  Heparin level goal = 0.3-0.7  Plan:  11/16 @ 0201 HL 0.35. Level is therapeutic. Will continue current infusion rate of 1050 units/hr. Recheck confirmatory HL and CBC in 8 hours.  Pharmacy will continue to  monitor and adjust per consult.   Pernell Dupre, PharmD, BCPS Clinical Pharmacist 05/02/2018 3:05 AM

## 2018-05-03 DIAGNOSIS — J441 Chronic obstructive pulmonary disease with (acute) exacerbation: Secondary | ICD-10-CM

## 2018-05-03 DIAGNOSIS — J9602 Acute respiratory failure with hypercapnia: Secondary | ICD-10-CM

## 2018-05-03 LAB — CBC
HCT: 28.8 % — ABNORMAL LOW (ref 39.0–52.0)
HEMOGLOBIN: 9.2 g/dL — AB (ref 13.0–17.0)
MCH: 29.1 pg (ref 26.0–34.0)
MCHC: 31.9 g/dL (ref 30.0–36.0)
MCV: 91.1 fL (ref 80.0–100.0)
PLATELETS: 172 10*3/uL (ref 150–400)
RBC: 3.16 MIL/uL — ABNORMAL LOW (ref 4.22–5.81)
RDW: 15.6 % — AB (ref 11.5–15.5)
WBC: 10.6 10*3/uL — AB (ref 4.0–10.5)
nRBC: 0 % (ref 0.0–0.2)

## 2018-05-03 LAB — BASIC METABOLIC PANEL
Anion gap: 14 (ref 5–15)
BUN: 57 mg/dL — AB (ref 8–23)
CHLORIDE: 99 mmol/L (ref 98–111)
CO2: 29 mmol/L (ref 22–32)
CREATININE: 1.89 mg/dL — AB (ref 0.61–1.24)
Calcium: 8.5 mg/dL — ABNORMAL LOW (ref 8.9–10.3)
GFR calc Af Amer: 38 mL/min — ABNORMAL LOW (ref >60)
GFR calc non Af Amer: 33 mL/min — ABNORMAL LOW (ref >60)
Glucose, Bld: 210 mg/dL — ABNORMAL HIGH (ref 70–99)
POTASSIUM: 3.8 mmol/L (ref 3.5–5.1)
SODIUM: 142 mmol/L (ref 135–145)

## 2018-05-03 LAB — CULTURE, BLOOD (ROUTINE X 2)
Culture: NO GROWTH
Culture: NO GROWTH
SPECIAL REQUESTS: ADEQUATE

## 2018-05-03 LAB — HEPARIN LEVEL (UNFRACTIONATED)
Heparin Unfractionated: 0.86 IU/mL — ABNORMAL HIGH (ref 0.30–0.70)
Heparin Unfractionated: 0.6 IU/mL (ref 0.30–0.70)
Heparin Unfractionated: 0.51 IU/mL (ref 0.30–0.70)

## 2018-05-03 LAB — GLUCOSE, CAPILLARY
GLUCOSE-CAPILLARY: 172 mg/dL — AB (ref 70–99)
GLUCOSE-CAPILLARY: 176 mg/dL — AB (ref 70–99)
GLUCOSE-CAPILLARY: 188 mg/dL — AB (ref 70–99)
GLUCOSE-CAPILLARY: 202 mg/dL — AB (ref 70–99)
Glucose-Capillary: 168 mg/dL — ABNORMAL HIGH (ref 70–99)
Glucose-Capillary: 187 mg/dL — ABNORMAL HIGH (ref 70–99)

## 2018-05-03 LAB — APTT: aPTT: 78 seconds — ABNORMAL HIGH (ref 24–36)

## 2018-05-03 NOTE — Progress Notes (Signed)
[facility]  ROUNDING NOTE   Subjective:  Patient remains intubated and sedated. Creatinine down to 1.89. Remains critically ill.    Objective:  Vital signs in last 24 hours:  Temp:  [97.9 F (36.6 C)-99.7 F (37.6 C)] 97.9 F (36.6 C) (11/17 0800) Pulse Rate:  [88-130] 89 (11/17 1130) Resp:  [13-24] 21 (11/17 1130) BP: (93-155)/(52-87) 123/60 (11/17 1130) SpO2:  [97 %-100 %] 100 % (11/17 1130) FiO2 (%):  [30 %] 30 % (11/17 1123) Weight:  [83.9 kg] 83.9 kg (11/17 0500)  Weight change: 0 kg Filed Weights   04/30/18 0500 05/02/18 0500 05/03/18 0500  Weight: 86 kg 83.9 kg 83.9 kg    Intake/Output: I/O last 3 completed shifts: In: 5075.4 [I.V.:2485.3; NG/GT:1980; IV Piggyback:610.1] Out: 6285 Vivian.Admire; Emesis/NG output:40]   Intake/Output this shift:  Total I/O In: 650.7 [I.V.:270.7; NG/GT:280; IV Piggyback:100] Out: 525 [Urine:525]  Physical Exam: General: Critically ill appearing  Head: ETT in place  Eyes: Anicteric  Neck: Supple, trachea midline  Lungs:  Scattered rhonchi, vent assisted  Heart: S1S2 no rubs  Abdomen:  Soft, nontender, bowel sounds present  Extremities: no peripheral edema.  Neurologic: Intubated/sedated  Skin: No lesions       Basic Metabolic Panel: Recent Labs  Lab 04/28/18 0313  04/30/18 0423 04/30/18 2359 05/01/18 0359 05/01/18 1313 05/01/18 1537 05/01/18 1719 05/02/18 0201 05/02/18 1655 05/03/18 0444  NA 137   < > 143 140 141  --   --   --  145  --  142  K 4.0   < > 3.7 3.6 3.4*  --   --   --  3.9  --  3.8  CL 103   < > 106 102 100  --   --   --  103  --  99  CO2 24   < > '26 28 27  ' --   --   --  27  --  29  GLUCOSE 122*   < > 164* 251* 213*  --   --   --  151*  --  210*  BUN 33*   < > 33* 39* 40*  --   --   --  52*  --  57*  CREATININE 2.52*   < > 1.52* 1.67* 1.55*  --   --   --  1.96*  --  1.89*  CALCIUM 8.2*   < > 8.5* 8.1* 8.2*  --   --   --  8.8*  --  8.5*  MG 1.8   < > 2.1  --   --  2.3 2.3 2.4 2.3 2.1   --   PHOS 4.6  --   --   --   --  3.5  --  2.8 3.5 3.9  --    < > = values in this interval not displayed.    Liver Function Tests: Recent Labs  Lab 04/28/18 0313 04/29/18 0302 04/30/18 0423  AST 74* 66* 52*  ALT 28 30 35  ALKPHOS 68 58 59  BILITOT 0.6 0.6 0.6  PROT 5.8* 5.5* 5.9*  ALBUMIN 3.0* 2.6* 2.7*   No results for input(s): LIPASE, AMYLASE in the last 168 hours. Recent Labs  Lab 04/29/18 0302  AMMONIA 21    CBC: Recent Labs  Lab 04/29/18 0302 04/30/18 0423 04/30/18 2359 05/01/18 0359 05/02/18 1038 05/03/18 0444  WBC 17.5* 9.7 10.8* 10.4 14.5* 10.6*  NEUTROABS 15.3* 9.0*  --   --   --   --  HGB 9.3* 8.5* 8.2* 9.0* 9.9* 9.2*  HCT 28.6* 26.3* 25.0* 26.8* 30.5* 28.8*  MCV 89.4 88.6 90.3 89.9 89.4 91.1  PLT 146* 126* 124* 132* 214 172    Cardiac Enzymes: Recent Labs  Lab 04/27/18 1125 04/27/18 1732 04/28/18 0313  TROPONINI 0.43* 0.57* 0.44*    BNP: Invalid input(s): POCBNP  CBG: Recent Labs  Lab 05/02/18 1918 05/02/18 2313 05/03/18 0404 05/03/18 0757 05/03/18 1138  GLUCAP 202* 182* 188* 168* 202*    Microbiology: Results for orders placed or performed during the hospital encounter of 04/26/18  Blood culture (routine x 2)     Status: None   Collection Time: 04/26/18  1:54 AM  Result Value Ref Range Status   Specimen Description BLOOD LEFT FATTY CASTS  Final   Special Requests   Final    BOTTLES DRAWN AEROBIC AND ANAEROBIC Blood Culture adequate volume   Culture   Final    NO GROWTH 5 DAYS Performed at [facility], [address] [place], [place] [address]    Report Status 05/01/2018 FINAL  Final  Blood culture (routine x 2)     Status: None   Collection Time: 04/26/18  1:55 AM  Result Value Ref Range Status   Specimen Description BLOOD LEFT ASSIST CONTROL  Final   Special Requests   Final    BOTTLES DRAWN AEROBIC AND ANAEROBIC Blood Culture results may not be optimal due to an excessive volume of blood received in  culture bottles   Culture   Final    NO GROWTH 5 DAYS Performed at [facility], [address] [place], [place] [address]    Report Status 05/01/2018 FINAL  Final  MRSA PCR Screening     Status: None   Collection Time: 04/26/18  2:54 AM  Result Value Ref Range Status   MRSA by PCR NEGATIVE NEGATIVE Final    Comment:        The GeneXpert MRSA Assay (FDA approved for NASAL specimens only), is one component of a comprehensive MRSA colonization surveillance program. It is not intended to diagnose MRSA infection nor to guide or monitor treatment for MRSA infections. Performed at [facility], [address] [place], [place] [address]   Culture, respiratory (non-expectorated)     Status: None   Collection Time: 04/27/18 10:06 AM  Result Value Ref Range Status   Specimen Description   Final    TRACHEAL ASPIRATE Performed at [facility], [address], [place], [place] [address]    Special Requests   Final    NONE Performed at [facility], [address] [place], [place] [address]    Gram Stain   Final    FEW WBC PRESENT, PREDOMINANTLY PMN RARE GRAM POSITIVE RODS    Culture   Final    FEW Consistent with normal respiratory flora. Performed at [facility], [place] [address] [place] [address] [place], [place] [address]    Report Status 04/29/2018 FINAL  Final  CULTURE, BLOOD (ROUTINE X 2) w Reflex to ID Panel     Status: None   Collection Time: 04/28/18 11:52 AM  Result Value Ref Range Status   Specimen Description BLOOD LEFT HAND  Final   Special Requests   Final    BOTTLES DRAWN AEROBIC AND ANAEROBIC Blood Culture results may not be optimal due to an inadequate volume of blood received in culture bottles   Culture   Final    NO GROWTH 5 DAYS Performed at [facility], [address], [place], [place] [address]    Report Status 05/03/2018 FINAL  Final  Culture, blood (Routine X 2) w Reflex to ID Panel     Status: None    Collection Time: 04/28/18  1:47 PM  Result Value Ref Range Status   Specimen Description BLOOD BLOOD LEFT HAND  Final   Special Requests   Final    BOTTLES DRAWN AEROBIC AND ANAEROBIC Blood Culture adequate volume   Culture   Final    NO GROWTH 5 DAYS Performed at [facility], [address], [place], [place] [address]    Report Status 05/03/2018 FINAL  Final    Coagulation Studies: No results for input(s): LABPROT, INR in the last 72 hours.  Urinalysis: No results for input(s): COLORURINE, LABSPEC, PHURINE, GLUCOSEU, HGBUR, BILIRUBINUR, KETONESUR, PROTEINUR, UROBILINOGEN, NITRITE, LEUKOCYTESUR in the last 72 hours.  Invalid input(s): APPERANCEUR    Imaging: No results found.   Medications:   . amiodarone 60 mg/hr (05/03/18 1100)  . ampicillin-sulbactam (UNASYN) IV Stopped (05/03/18 0946)  . heparin 900 Units/hr (05/03/18 1100)  . propofol (DIPRIVAN) infusion 25 mcg/kg/min (05/03/18 1100)   . aspirin  81 mg Per Tube Daily  . atorvastatin  40 mg Per Tube Daily  . budesonide (PULMICORT) nebulizer solution  0.5 mg Nebulization BID  . chlorhexidine gluconate (MEDLINE KIT)  15 mL Mouth Rinse BID  . clopidogrel  75 mg Per Tube Daily  . feeding supplement (VITAL HIGH PROTEIN)  1,000 mL Per Tube Q24H  . furosemide  40 mg Intravenous Q12H  . insulin aspart  0-9 Units Subcutaneous Q4H  . ipratropium-albuterol  3 mL Nebulization Q4H  . lipase/protease/amylase  12,000 Units Oral Q8H  . mouth rinse  15 mL Mouth Rinse 10 times per day  . methylPREDNISolone (SOLU-MEDROL) injection  40 mg Intravenous Q12H  . metoprolol tartrate  5 mg Intravenous Q6H  . oxyCODONE-acetaminophen  1 tablet Oral Q6H  . pantoprazole (PROTONIX) IV  40 mg Intravenous Daily  . QUEtiapine  100 mg Oral BID  . sodium chloride flush  10-40 mL Intracatheter Q12H   acetaminophen **OR** acetaminophen, bisacodyl, bisacodyl, fentaNYL (SUBLIMAZE) injection, fentaNYL (SUBLIMAZE) injection, haloperidol  lactate, midazolam, ondansetron **OR** ondansetron (ZOFRAN) IV, sennosides, sodium chloride flush  Assessment/ Plan:  77 y.o. male with a PMHx of abdominal aortic aneurysm, coronary artery disease, congestive heart failure, exocrine pancreatic insufficiency, chronic kidney disease stage III baseline creatinine 1.6, prostate cancer, hypertension, who was admitted to [facility] on 04/26/2018 for evaluation of respiratory distress.  1.  Acute renal failure/chronic kidney disease stage III baseline creatinine 1.6.  It appears acute renal failure now is multifactorial with contributions from concurrent illness and hypotension.  Negative ANA/ANA/GBM/SPEP/UPEP -Renal function continues to improve.  Creatinine down to 1.89.  No indication for dialysis.  Continue to monitor renal parameters daily.  2.  Hypotension.    Patient remains off pressors at the moment.  Continue to monitor blood pressure and mean arterial pressure.  3.  Acute respiratory failure.  Patient was previously reintubated for agitation.  Weaning as per pulmonary/critical care.   LOS: 7 Matthew Zamora 11/17/201912:03 PM

## 2018-05-03 NOTE — Progress Notes (Signed)
Patient ID: Matthew Zamora, male   DOB: [birth year], 77 y.o.   MRN: [id number]  [employer] PROGRESS NOTE  VENKAT ANKNEY IHK:[id number] DOB: 1941/06/05 DOA: 04/26/2018 PCP: Maryland Pink, MD  HPI/Subjective Currently on ventilator [place] volume 450 Rate 18 PEEP 5 FiO2 30% Unable to give any history  Objective: Vitals:   05/03/18 1000 05/03/18 1030  BP: 130/71 108/62  Pulse: (!) 119 (!) 119  Resp: 16 13  Temp:    SpO2: 100% 100%    Filed Weights   04/30/18 0500 05/02/18 0500 05/03/18 0500  Weight: 86 kg 83.9 kg 83.9 kg    ROS: Review of Systems  Unable to perform ROS: Intubated   Exam: Physical Exam  HENT:  Nose: No mucosal edema.  Mouth/Throat: No oropharyngeal exudate or posterior oropharyngeal edema.  Unable to look into mouth  Eyes: Conjunctivae and lids are normal.  Neck: No JVD present. Carotid bruit is not present. No edema present. No thyroid mass and no thyromegaly present.  Cardiovascular: Regular rhythm, S1 normal, S2 normal and normal heart sounds. Exam reveals no gallop.  No murmur heard. Pulses:      Dorsalis pedis pulses are 2+ on the right side, and 2+ on the left side.  Respiratory: No respiratory distress. He has decreased breath sounds in the right middle field, the right lower field, the left middle field and the left lower field. He has wheezes in the right middle field and the left middle field. He has rhonchi in the right lower field and the left lower field. He has no rales.  GI: Soft. Bowel sounds are normal. There is no tenderness.  Musculoskeletal:       Right ankle: He exhibits no swelling.       Left ankle: He exhibits no swelling.  Lymphadenopathy:    He has no cervical adenopathy.  Neurological:  Sedated on ventilator  Skin: Skin is warm. No rash noted. Nails show no clubbing.  Psychiatric:  Unable to assess secondary to intubation      Data Reviewed: Basic Metabolic Panel: Recent Labs  Lab 04/28/18 0313   04/30/18 0423 04/30/18 2359 05/01/18 0359 05/01/18 1313 05/01/18 1537 05/01/18 1719 05/02/18 0201 05/02/18 1655 05/03/18 0444  NA 137   < > 143 140 141  --   --   --  145  --  142  K 4.0   < > 3.7 3.6 3.4*  --   --   --  3.9  --  3.8  CL 103   < > 106 102 100  --   --   --  103  --  99  CO2 24   < > '26 28 27  ' --   --   --  27  --  29  GLUCOSE 122*   < > 164* 251* 213*  --   --   --  151*  --  210*  BUN 33*   < > 33* 39* 40*  --   --   --  52*  --  57*  CREATININE 2.52*   < > 1.52* 1.67* 1.55*  --   --   --  1.96*  --  1.89*  CALCIUM 8.2*   < > 8.5* 8.1* 8.2*  --   --   --  8.8*  --  8.5*  MG 1.8   < > 2.1  --   --  2.3 2.3 2.4 2.3 2.1  --   PHOS 4.6  --   --   --   --  3.5  --  2.8 3.5 3.9  --    < > = values in this interval not displayed.   Liver Function Tests: Recent Labs  Lab 04/28/18 0313 04/29/18 0302 04/30/18 0423  AST 74* 66* 52*  ALT 28 30 35  ALKPHOS 68 58 59  BILITOT 0.6 0.6 0.6  PROT 5.8* 5.5* 5.9*  ALBUMIN 3.0* 2.6* 2.7*   CBC: Recent Labs  Lab 04/29/18 0302 04/30/18 0423 04/30/18 2359 05/01/18 0359 05/02/18 1038 05/03/18 0444  WBC 17.5* 9.7 10.8* 10.4 14.5* 10.6*  NEUTROABS 15.3* 9.0*  --   --   --   --   HGB 9.3* 8.5* 8.2* 9.0* 9.9* 9.2*  HCT 28.6* 26.3* 25.0* 26.8* 30.5* 28.8*  MCV 89.4 88.6 90.3 89.9 89.4 91.1  PLT 146* 126* 124* 132* 214 172   Cardiac Enzymes: Recent Labs  Lab 04/27/18 1125 04/27/18 1732 04/28/18 0313  TROPONINI 0.43* 0.57* 0.44*   BNP (last 3 results) Recent Labs    04/26/18 0030 04/27/18 1054 04/29/18 0302  BNP 717.0* 799.0* 889.0*    CBG: Recent Labs  Lab 05/02/18 1605 05/02/18 1918 05/02/18 2313 05/03/18 0404 05/03/18 0757  GLUCAP 190* 202* 182* 188* 168*    Recent Results (from the past 240 hour(s))  Blood culture (routine x 2)     Status: None   Collection Time: 04/26/18  1:54 AM  Result Value Ref Range Status   Specimen Description BLOOD LEFT FATTY CASTS  Final   Special Requests   Final     BOTTLES DRAWN AEROBIC AND ANAEROBIC Blood Culture adequate volume   Culture   Final    NO GROWTH 5 DAYS Performed at [facility], [address] [place], [place] [address]    Report Status 05/01/2018 FINAL  Final  Blood culture (routine x 2)     Status: None   Collection Time: 04/26/18  1:55 AM  Result Value Ref Range Status   Specimen Description BLOOD LEFT ASSIST CONTROL  Final   Special Requests   Final    BOTTLES DRAWN AEROBIC AND ANAEROBIC Blood Culture results may not be optimal due to an excessive volume of blood received in culture bottles   Culture   Final    NO GROWTH 5 DAYS Performed at [facility], [address] [place], [place] [address]    Report Status 05/01/2018 FINAL  Final  MRSA PCR Screening     Status: None   Collection Time: 04/26/18  2:54 AM  Result Value Ref Range Status   MRSA by PCR NEGATIVE NEGATIVE Final    Comment:        The GeneXpert MRSA Assay (FDA approved for NASAL specimens only), is one component of a comprehensive MRSA colonization surveillance program. It is not intended to diagnose MRSA infection nor to guide or monitor treatment for MRSA infections. Performed at [facility], [address] [place], [place] [address]   Culture, respiratory (non-expectorated)     Status: None   Collection Time: 04/27/18 10:06 AM  Result Value Ref Range Status   Specimen Description   Final    TRACHEAL ASPIRATE Performed at [facility], [address] [place], [place] [address]    Special Requests   Final    NONE Performed at [facility], [place], [place] [address]    Gram Stain   Final    FEW WBC PRESENT, PREDOMINANTLY PMN RARE GRAM POSITIVE RODS    Culture  Final    FEW Consistent with normal respiratory flora. Performed at [facility], [place] [address], [place], [place] [address]    Report Status 04/29/2018 FINAL  Final  CULTURE, BLOOD (ROUTINE X 2) w Reflex to  ID Panel     Status: None   Collection Time: 04/28/18 11:52 AM  Result Value Ref Range Status   Specimen Description BLOOD LEFT HAND  Final   Special Requests   Final    BOTTLES DRAWN AEROBIC AND ANAEROBIC Blood Culture results may not be optimal due to an inadequate volume of blood received in culture bottles   Culture   Final    NO GROWTH 5 DAYS Performed at [facility], [address], [place], [place] [address]    Report Status 05/03/2018 FINAL  Final  Culture, blood (Routine X 2) w Reflex to ID Panel     Status: None   Collection Time: 04/28/18  1:47 PM  Result Value Ref Range Status   Specimen Description BLOOD BLOOD LEFT HAND  Final   Special Requests   Final    BOTTLES DRAWN AEROBIC AND ANAEROBIC Blood Culture adequate volume   Culture   Final    NO GROWTH 5 DAYS Performed at [facility], [address] [place], [place] [address]    Report Status 05/03/2018 FINAL  Final     Studies: Ct Head Wo Contrast  Result Date: 05/01/2018 CLINICAL DATA:  Acute encephalopathy with intermittent agitation. EXAM: CT HEAD WITHOUT CONTRAST TECHNIQUE: Contiguous axial images were obtained from the base of the skull through the vertex without intravenous contrast. COMPARISON:  04/28/2018 FINDINGS: Brain: There is no evidence of acute infarct, intracranial hemorrhage, mass, midline shift, or extra-axial fluid collection. The ventricles and sulci are within normal limits for age. Vascular: Calcified atherosclerosis at the skull base. No hyperdense vessel. Skull: No fracture or focal osseous lesion. Sinuses/Orbits: Bilateral cataract extraction. Visualized paranasal sinuses and mastoid air cells are clear. Other: None. IMPRESSION: No evidence of acute intracranial abnormality. Electronically Signed   By: Logan Bores M.D.   On: 05/01/2018 11:33    Scheduled Meds: . aspirin  81 mg Per Tube Daily  . atorvastatin  40 mg Per Tube Daily  . budesonide (PULMICORT) nebulizer  solution  0.5 mg Nebulization BID  . chlorhexidine gluconate (MEDLINE KIT)  15 mL Mouth Rinse BID  . clopidogrel  75 mg Per Tube Daily  . feeding supplement (VITAL HIGH PROTEIN)  1,000 mL Per Tube Q24H  . furosemide  40 mg Intravenous Q12H  . insulin aspart  0-9 Units Subcutaneous Q4H  . ipratropium-albuterol  3 mL Nebulization Q4H  . lipase/protease/amylase  12,000 Units Oral Q8H  . mouth rinse  15 mL Mouth Rinse 10 times per day  . methylPREDNISolone (SOLU-MEDROL) injection  40 mg Intravenous Q12H  . metoprolol tartrate  5 mg Intravenous Q6H  . oxyCODONE-acetaminophen  1 tablet Oral Q6H  . pantoprazole (PROTONIX) IV  40 mg Intravenous Daily  . QUEtiapine  100 mg Oral BID  . sodium chloride flush  10-40 mL Intracatheter Q12H   Continuous Infusions: . amiodarone 60 mg/hr (05/03/18 1000)  . ampicillin-sulbactam (UNASYN) IV Stopped (05/03/18 0946)  . heparin 900 Units/hr (05/03/18 1000)  . propofol (DIPRIVAN) infusion 50 mcg/kg/min (05/03/18 1000)    Assessment/Plan:  1. Septic shock secondary to pneumonia.  Currently on Unasyn.  Off pressors currently.  Follow-up cultures. 2. Acute hypoxic respiratory failure with respiratory acidosis.  Continue mechanical ventilation.  Weaning  trials as per ICU attending.  Spontaneous breathing and awakening trials. 3. Atrial fibrillation with rapid ventricular response.  Echocardiogram showed an EF of 40 to 45%.  Continue amiodarone and heparin drip.  Converted to sinus rhythm this morning. 4. Acute encephalopathy.  CT scan negative today.  Need continued monitoring on mental status. 5. Elevated troponin secondary to demand ischemia with septic shock.  On aspirin. 6. Acute kidney injury on chronic kidney disease stage III.  Creatinine continued to improve to 1.55 7. Pancreatic insufficiency on pancreatic enzymes 8. Anemia of chronic disease.  Hemoglobin today 9.0 9. Hyperlipidemia unspecified on Lipitor 10. History of prostate cancer 11. History  of chronic combined systolic diastolic congestive heart failure.  Patient given a dose of Lasix  Code Status: Full code    Code Status Orders  (From admission, onward)         Start     Ordered   04/26/18 0254  Full code  Continuous     04/26/18 0253        Code Status History    This patient has a current code status but no historical code status.     Family Communication: Spoke with family at bedside yesterday Disposition Plan: To be determined  Consultants:  Critical care specialist  Cardiology  Nephrology  Antibiotics: -Unasyn  Time spent: 32 minutes.  Case discussed with nursing staff  [facility]

## 2018-05-03 NOTE — Plan of Care (Signed)
Patient remains intubated.  No acute issues.  Tolerated meds. Compliant with ventilator.  Good urine output.  Will continue to monitor.

## 2018-05-03 NOTE — Progress Notes (Signed)
[place] for heparin dosing for this 89 YOM admitted on 11/10 with acute AMS/delirium and suspected pneumonia. Developed AFRVR on 11/11 requiring anticoagulation.  Indication: Anticoagulation-Afib   No Known Allergies  Patient Measurements: Height: 5\' 11"  (180.3 cm) Weight: 184 lb 15.5 oz (83.9 kg) IBW/kg (Calculated) : 75.3 Heparin Dosing Weight: 83.4 kg (actual BW)  Vital Signs: Temp: 98.4 F (36.9 C) (11/17 1600) Temp Source: Oral (11/17 1600) BP: 154/67 (11/17 1900) Pulse Rate: 81 (11/17 1900)  Labs: Recent Labs    05/01/18 0359  05/02/18 0201 05/02/18 1038 05/03/18 0444 05/03/18 1403 05/03/18 2200  HGB 9.0*  --   --  9.9* 9.2*  --   --   HCT 26.8*  --   --  30.5* 28.8*  --   --   PLT 132*  --   --  214 172  --   --   APTT  --   --   --   --   --  78*  --   HEPARINUNFRC 1.36*   < > 0.35 0.48 0.86* 0.60 0.51  CREATININE 1.55*  --  1.96*  --  1.89*  --   --    < > = values in this interval not displayed.    Estimated Creatinine Clearance: 35.4 mL/min (A) (by C-G formula based on SCr of 1.89 mg/dL (H)).   Medical History: Past Medical History:  Diagnosis Date  . Abdominal aortic aneurysm (AAA) ([place]) 05/13/15   seen on ct scan  . 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 Anemia   . 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 CAD (coronary artery disease)   . Cataract cortical, senile   . CHF (congestive heart failure) ([place])   . Chronic hoarseness   . Exocrine pancreatic insufficiency   . H. pylori infection   . History of hepatitis   . Hyperlipidemia   . Hypertension   . 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 Prostate CA [facility])     Assessment: Patient will require anticoagulation for stroke prevention related to newly developed A-fib. CHA2DS2-VASc is at least 4 (age >71, HTN, CHF). Patient currently receiving heparin at 900 units/hr.   11/16 @ 0201 HL 0.35. Level is therapeutic. Will continue current infusion rate of  1050 units/hr.   11/16 @ 10:38 HL = 0.48. Continue current drip rate.  Goal of Therapy:  Heparin level goal = 0.3-0.7  Plan:  11/17 @  2233 HL  0.51. Level now therapeutic x 2 levels. Will continue current infusion rate of 900 units/hr. Recheck HL and CBC daily with AM labs per protocol.   Pharmacy will continue to monitor and adjust per consult.   Pernell Dupre, [facility] Clinical Pharmacist 05/03/2018 10:57 PM

## 2018-05-03 NOTE — Progress Notes (Signed)
CRITICAL CARE NOTE  CC  Severe  respiratory failure  SUBJECTIVE Patient remains critically ill Prognosis is guarded Started scheduled percocet and Seroquel for agitation and pain On vent support      SIGNIFICANT STUDIES/EVENTS:  11/10 Echcoardiogram: LVEF 40-45%. Mild MR. LA moderately dilated. RVSP estimate 50 mmHg 11/11 Developed AFRVR. NE changed to PE. Amiodarone infusion intitiated.  11/12 Severe intermittent agitation. Propofol initiated, ABG improved with vent adjustment, head CT done negative 11/13: Doing okay, last night went into A. fib with slightly higher rate in 100s, increase amnio dose, SBT and extubated agitation required Precedex 11/14: Doing well, ABG and everything else acceptable, slight drop in platelet to 126, neurology consult requested because of ongoing agitation 1115 patient was reintubated last night because of possible aspiration, doing well ABG and all other numbers acceptable neurology planning for another head CT 11/16 failure to wean from vent, [facility] did NOT accept for transfer  CULTURES:  MRSA PCR 11/10 >> NEG Resp 11/11 >> G PR Blood 11/10 >> Blood: 04/28/2018 Urine strep Ag 11/11 >> Urine Legionella Ag 11/11 >>  ANTIBIOTICS:  Azithromycin 11/10 >> stopped at 04/28/2018 Ceftriaxone 11/10 >> stopped at 04/28/2018 Vancomycin: 04/28/2018-04/30/2018 Unasyn: 04/28/2018-    BP 127/79   Pulse (!) 119   Temp 99.2 F (37.3 C) (Axillary)   Resp 16   Ht _0  (1.803 m)   Wt 83.9 kg   SpO2 100%   BMI 25.80 kg/m    REVIEW OF SYSTEMS  PATIENT IS UNABLE TO PROVIDE COMPLETE REVIEW OF SYSTEMS DUE TO SEVERE CRITICAL ILLNESS   PHYSICAL EXAMINATION:  GENERAL:critically ill appearing, +resp distress HEAD: Normocephalic, atraumatic.  EYES: Pupils equal, round, reactive to light.  No scleral icterus.  MOUTH: Moist mucosal membrane. NECK: Supple. No thyromegaly. No nodules. No JVD.  PULMONARY: +rhonchi, +wheezing CARDIOVASCULAR: S1 and  S2. Regular rate and rhythm. No murmurs, rubs, or gallops.  GASTROINTESTINAL: Soft, nontender, -distended. No masses. Positive bowel sounds. No hepatosplenomegaly.  MUSCULOSKELETAL: No swelling, clubbing, or edema.  NEUROLOGIC: obtunded, GCS<8 SKIN:intact,warm,dry      Indwelling Urinary Catheter continued, requirement due to   Reason to continue Indwelling Urinary Catheter for strict Intake/Output monitoring for hemodynamic instability   Central Line continued, requirement due to   Reason to continue [employer] Monitoring of central venous pressure or other hemodynamic parameters   Ventilator continued, requirement due to, resp failure    Ventilator Sedation RASS 0 to -2     ASSESSMENT AND PLAN SYNOPSIS 77 yo WM with recurrent bouts of resp failure from aspiration pneumonia and CHF exacerbation  complicated by severe agitation and delerium   Severe Hypoxic and Hypercapnic Respiratory Failure -continue Full MV support -continue Bronchodilator Therapy -Wean Fio2 and PEEP as tolerated -will perform SAT/SBT when respiratory parameters are met   CARDIAC FAILURE-CHF EF 30%-Afib with RVR Follow up cardiology recs Amiodarone infusion Heparin infusion   Renal Failure-most likely due to ATN -follow chem 7 -follow UO -continue Foley Catheter-assess need daily   NEUROLOGY - intubated and sedated - minimal sedation to achieve a RASS goal: -1 Wake up assessment pending    CARDIAC ICU monitoring  ID -continue IV abx as prescibed -follow up cultures  GI/Nutrition GI PROPHYLAXIS as indicated DIET-->TF's as tolerated Constipation protocol as indicated  ENDO - ICU hypoglycemic\Hyperglycemia protocol -check FSBS per protocol   ELECTROLYTES -follow labs as needed -replace as needed -pharmacy consultation and following   DVT/GI PRX ordered TRANSFUSIONS AS NEEDED MONITOR FSBS ASSESS the need for LABS  as needed   Critical Care Time devoted to patient  care services described in this note is 37 minutes.   Overall, patient is critically ill, prognosis is guarded.  Patient with Multiorgan failure and at high risk for cardiac arrest and death.    Corrin Parker, M.D.  [facility]  Medical Director [place] Director [facility] Cardio-Pulmonary Department

## 2018-05-03 NOTE — Progress Notes (Signed)
[place] for heparin dosing for this 28 YOM admitted on 11/10 with acute AMS/delirium and suspected pneumonia. Developed AFRVR on 11/11 requiring anticoagulation.  Indication: Anticoagulation-Afib   No Known Allergies  Patient Measurements: Height: 5\' 11"  (180.3 cm) Weight: 184 lb 15.5 oz (83.9 kg) IBW/kg (Calculated) : 75.3 Heparin Dosing Weight: 83.4 kg (actual BW)  Vital Signs: Temp: 99.2 F (37.3 C) (11/17 0000) Temp Source: Axillary (11/17 0000) BP: 127/79 (11/16 1900) Pulse Rate: 119 (11/16 1900)  Labs: Recent Labs    04/30/18 2359 05/01/18 0359  05/02/18 0201 05/02/18 1038 05/03/18 0444  HGB 8.2* 9.0*  --   --  9.9* 9.2*  HCT 25.0* 26.8*  --   --  30.5* 28.8*  PLT 124* 132*  --   --  214 172  HEPARINUNFRC  --  1.36*   < > 0.35 0.48 0.86*  CREATININE 1.67* 1.55*  --  1.96*  --   --    < > = values in this interval not displayed.    Estimated Creatinine Clearance: 34.1 mL/min (A) (by C-G formula based on SCr of 1.96 mg/dL (H)).   Medical History: Past Medical History:  Diagnosis Date  . Abdominal aortic aneurysm (AAA) ([place]) 05/13/15   seen on ct scan  . 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 Anemia   . 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 CAD (coronary artery disease)   . Cataract cortical, senile   . CHF (congestive heart failure) ([place])   . Chronic hoarseness   . Exocrine pancreatic insufficiency   . H. pylori infection   . History of hepatitis   . Hyperlipidemia   . Hypertension   . 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 Prostate CA [facility])     Assessment: Patient will require anticoagulation for stroke prevention related to newly developed A-fib. CHA2DS2-VASc is at least 4 (age >56, HTN, CHF). Patient currently receiving heparin at 900 units/hr.   11/16 @ 0201 HL 0.35. Level is therapeutic. Will continue current infusion rate of 1050 units/hr.   11/16 @ 10:38 HL = 0.48. Continue current drip  rate.  Goal of Therapy:  Heparin level goal = 0.3-0.7  Plan:  11/17 @ 0537 HL 0.86. Level is supratherapeutic. Will decrease heparin infusion to rate of 900 units/hr and recheck HL in 8 hours.   Pharmacy will continue to monitor and adjust per consult.   Pernell Dupre, PharmD, BCPS Clinical Pharmacist 05/03/2018 5:45 AM

## 2018-05-03 NOTE — Progress Notes (Signed)
Progress Note  Patient Name: Matthew Zamora Date of Encounter: 05/03/2018  Primary Cardiologist: [facility]  Subjective   Remains intubated. Sedation weaned. Seroquel has helped significantly given his underlying severe anxiety, per family (son's girlfriend is an NP and in the room today during rounds). SCr 1.96-->1.89. Potassium 3.8. HGB 9.9-->9.2, WBC 14.5-->10.6. Remains on heparin and amiodarone gtts.   Converted to sinus rhythm this morning and remains in sinus rhythm with heart rates in the 90s to low 100s bpm with frequent PACs and PVCs  FiO2 30%. Weaning.   Family is pleased with the patient's progress.  Inpatient Medications    Scheduled Meds: . aspirin  81 mg Per Tube Daily  . atorvastatin  40 mg Per Tube Daily  . budesonide (PULMICORT) nebulizer solution  0.5 mg Nebulization BID  . chlorhexidine gluconate (MEDLINE KIT)  15 mL Mouth Rinse BID  . clopidogrel  75 mg Per Tube Daily  . feeding supplement (VITAL HIGH PROTEIN)  1,000 mL Per Tube Q24H  . furosemide  40 mg Intravenous Q12H  . insulin aspart  0-9 Units Subcutaneous Q4H  . ipratropium-albuterol  3 mL Nebulization Q4H  . lipase/protease/amylase  12,000 Units Oral Q8H  . mouth rinse  15 mL Mouth Rinse 10 times per day  . methylPREDNISolone (SOLU-MEDROL) injection  40 mg Intravenous Q12H  . metoprolol tartrate  5 mg Intravenous Q6H  . oxyCODONE-acetaminophen  1 tablet Oral Q6H  . pantoprazole (PROTONIX) IV  40 mg Intravenous Daily  . QUEtiapine  100 mg Oral BID  . sodium chloride flush  10-40 mL Intracatheter Q12H   Continuous Infusions: . amiodarone 60 mg/hr (05/03/18 1230)  . ampicillin-sulbactam (UNASYN) IV Stopped (05/03/18 0946)  . heparin 900 Units/hr (05/03/18 1200)  . propofol (DIPRIVAN) infusion 10 mcg/kg/min (05/03/18 1200)   PRN Meds: acetaminophen **OR** acetaminophen, bisacodyl, bisacodyl, fentaNYL (SUBLIMAZE) injection, fentaNYL (SUBLIMAZE) injection, haloperidol lactate, midazolam,  ondansetron **OR** ondansetron (ZOFRAN) IV, sennosides, sodium chloride flush   Vital Signs    Vitals:   05/03/18 1040 05/03/18 1100 05/03/18 1130 05/03/18 1200  BP:  131/64 123/60 132/62  Pulse: 85 88 89 91  Resp: 16 15 (!) 21 (!) 22  Temp:      TempSrc:      SpO2: 100% 100% 100% 100%  Weight:      Height:        Intake/Output Summary (Last 24 hours) at 05/03/2018 1321 Last data filed at 05/03/2018 1200 Gross per 24 hour  Intake 3247.74 ml  Output 3425 ml  Net -177.26 ml   Filed Weights   04/30/18 0500 05/02/18 0500 05/03/18 0500  Weight: 86 kg 83.9 kg 83.9 kg    Telemetry    Converted to sinus rhythm this morning and remains in sinus rhythm with heart rates in the 90s to low 100s bpm with frequent PACs and PVCs - Personally Reviewed  ECG    n/a - Personally Reviewed  Physical Exam   GEN: Critically ill appearing. Will open eyes.  Neck: JVD difficult to assess secondary to mechanical ventilation. Cardiac: RRR, no murmurs, rubs, or gallops.  Respiratory: Diminished breath sounds bilaterally. Intubated.  GI: Soft, nontender, non-distended.   MS: Trace lower extremity edema with 1+ upper extremity edema L>R; No deformity. Neuro:  Intubated.  Psych: Intubated.  Labs    Chemistry Recent Labs  Lab 04/28/18 0313 04/29/18 0302  04/30/18 0423  05/01/18 0359 05/02/18 0201 05/03/18 0444  NA 137 140   < > 143   < >  141 145 142  K 4.0 3.5   < > 3.7   < > 3.4* 3.9 3.8  CL 103 104   < > 106   < > 100 103 99  CO2 24 26   < > 26   < > '27 27 29  ' GLUCOSE 122* 108*   < > 164*   < > 213* 151* 210*  BUN 33* 33*   < > 33*   < > 40* 52* 57*  CREATININE 2.52* 2.05*   < > 1.52*   < > 1.55* 1.96* 1.89*  CALCIUM 8.2* 8.4*   < > 8.5*   < > 8.2* 8.8* 8.5*  PROT 5.8* 5.5*  --  5.9*  --   --   --   --   ALBUMIN 3.0* 2.6*  --  2.7*  --   --   --   --   AST 74* 66*  --  52*  --   --   --   --   ALT 28 30  --  35  --   --   --   --   ALKPHOS 68 58  --  59  --   --   --   --     BILITOT 0.6 0.6  --  0.6  --   --   --   --   GFRNONAA 23* 30*   < > 43*   < > 42* 31* 33*  GFRAA 27* 35*   < > 50*   < > 48* 36* 38*  ANIONGAP 10 10   < > 11   < > '14 15 14   ' < > = values in this interval not displayed.     Hematology Recent Labs  Lab 05/01/18 0359 05/02/18 1038 05/03/18 0444  WBC 10.4 14.5* 10.6*  RBC 2.98* 3.41* 3.16*  HGB 9.0* 9.9* 9.2*  HCT 26.8* 30.5* 28.8*  MCV 89.9 89.4 91.1  MCH 30.2 29.0 29.1  MCHC 33.6 32.5 31.9  RDW 15.0 15.2 15.6*  PLT 132* 214 172    Cardiac Enzymes Recent Labs  Lab 04/27/18 1125 04/27/18 1732 04/28/18 0313  TROPONINI 0.43* 0.57* 0.44*   No results for input(s): TROPIPOC in the last 168 hours.   BNP Recent Labs  Lab 04/27/18 1054 04/29/18 0302  BNP 799.0* 889.0*     DDimer No results for input(s): DDIMER in the last 168 hours.   Radiology    No results found.  Cardiac Studies   Echo 04/27/2018: Study Conclusions  - Left ventricle: The cavity size was normal. There was moderate focal basal hypertrophy of the septum. Systolic function was mildly to moderately reduced. The estimated ejection fraction was in the range of 40% to 45%. Images were inadequate for LV wall motion assessment. The study is not technically sufficient to allow evaluation of LV diastolic function. - Mitral valve: There was mild regurgitation. - Left atrium: The atrium was moderately dilated. - Right atrium: The atrium was mildly dilated. - Pulmonary arteries: Systolic pressure was moderately increased. PA peak pressure: 50 mm Hg (S). - Inferior vena cava: The vessel was dilated. The respirophasic diameter changes were blunted (<50%), consistent with elevated central venous pressure.  Patient Profile     77 y.o. male with history of coronary artery disease, chronic systolic heart failure, ventricular tachycardia status post ICD, and chronic kidney disease stage III,whowas admitted due to acute respiratory  failure thought to be due to pneumonia and developed intermittent  episodes of atrial fibrillation with rapid ventricular response.  Assessment & Plan    1. Acute respiratory failure with hypoxia and hypercapnia: -Reintubated on 11/14 in the afternoon in the setting of hypoxia and hypercapnia -Remains on 30% FiO2, weaning -Management per critical care  2. PAF with RVR: -He re-developed A. fib with RVR on the morning of 11/16 at approximately 11 AM, during attempted weaning of sedation -Converted back to sinus rhythm this morning and remains in sinus rhythm with frequent PACs and PVCs -High risk for recurrent arrhythmia -With conversion to sinus rhythm, his BP has improved to the 327M systolic -Continue IV amiodarone full dose -IV metoprolol -Continue IV heparin with plans to transition to oral anticoagulation given his CHADS2VASc of 5 once he is taking orals and it is clear that no intervention will be needed -Once he is started on oral anticoagulation will discontinue aspirin  3. CAD with elevated troponin: -Mild troponin bump in the setting of the above -In the setting of his acute on chronic kidney disease and recurrent respiratory failure he has not been felt to be a good candidate for ischemic evaluation at this time -Remains on heparin as above -Continue aspirin and Plavix along with beta-blocker -Resume statin once he is able to take p.o. -He will likely need ischemic evaluation and follow-up  4. HFrEF: -He continues to be up approximately 12 L for the admission -Staff report good UOP -If needed could check CVP's via his PICC line -Continue IV beta-blocker -Continue IV Lasix   5. Acute on CKD stage III: -Possibly in the setting of ATN -Improving -Remains on IV Lasix 40 mg bid, replete KCl as needed  6. Anemia: -HGB low, though stable -Monitor -Per primary   7. Hypokalemia: -Improving    For questions or updates, please contact [place] Please consult  [email or web address] for contact info under Cardiology/STEMI.    Signed, Christell Faith, PA-C [place] Pager: [phone] 05/03/2018, 1:21 PM

## 2018-05-03 NOTE — Progress Notes (Signed)
Matthew Zamora for heparin dosing for this 56 YOM admitted on 11/10 with acute AMS/delirium and suspected pneumonia. Developed AFRVR on 11/11 requiring anticoagulation.  Indication: Anticoagulation-Afib   No Known Allergies  Patient Measurements: Height: 5\' 11"  (180.3 cm) Weight: 184 lb 15.5 oz (83.9 kg) IBW/kg (Calculated) : 75.3 Heparin Dosing Weight: 83.4 kg (actual BW)  Vital Signs: Temp: 98.3 F (36.8 C) (11/17 1200) Temp Source: Oral (11/17 1200) BP: 142/65 (11/17 1330) Pulse Rate: 57 (11/17 1330)  Labs: Recent Labs    05/01/18 0359  05/02/18 0201 05/02/18 1038 05/03/18 0444 05/03/18 1403  HGB 9.0*  --   --  9.9* 9.2*  --   HCT 26.8*  --   --  30.5* 28.8*  --   PLT 132*  --   --  214 172  --   APTT  --   --   --   --   --  78*  HEPARINUNFRC 1.36*   < > 0.35 0.48 0.86* 0.60  CREATININE 1.55*  --  1.96*  --  1.89*  --    < > = values in this interval not displayed.    Estimated Creatinine Clearance: 35.4 mL/min (A) (by C-G formula based on SCr of 1.89 mg/dL (H)).   Medical History: Past Medical History:  Diagnosis Date  . Abdominal aortic aneurysm (AAA) ([place]) 05/13/15   seen on ct scan  . 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 Anemia   . 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 CAD (coronary artery disease)   . Cataract cortical, senile   . CHF (congestive heart failure) ([place])   . Chronic hoarseness   . Exocrine pancreatic insufficiency   . H. pylori infection   . History of hepatitis   . Hyperlipidemia   . Hypertension   . 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 Prostate CA [facility])     Assessment: Patient will require anticoagulation for stroke prevention related to newly developed A-fib. CHA2DS2-VASc is at least 4 (age >54, HTN, CHF). Patient currently receiving heparin at 900 units/hr.   11/16 @ 0201 HL 0.35. Level is therapeutic. Will continue current infusion rate of 1050 units/hr.   11/16 @ 10:38 HL = 0.48.  Continue current drip rate.  Goal of Therapy:  Heparin level goal = 0.3-0.7  Plan:  11/17 @ 0537 HL 0.86. Level is supratherapeutic. Will decrease heparin infusion to rate of 900 units/hr and recheck HL in 8 hours.   11/17 ~ 14:00 HL = 0.60.  Continue current drip rate.  Check HL in 8 hours tonight at 22:00.   Pharmacy will continue to monitor and adjust per consult.   Olivia Canter [facility] Clinical Pharmacist 05/03/2018 2:48 PM

## 2018-05-04 LAB — GLUCOSE, CAPILLARY
GLUCOSE-CAPILLARY: 134 mg/dL — AB (ref 70–99)
GLUCOSE-CAPILLARY: 148 mg/dL — AB (ref 70–99)
GLUCOSE-CAPILLARY: 153 mg/dL — AB (ref 70–99)
Glucose-Capillary: 181 mg/dL — ABNORMAL HIGH (ref 70–99)
Glucose-Capillary: 166 mg/dL — ABNORMAL HIGH (ref 70–99)
Glucose-Capillary: 159 mg/dL — ABNORMAL HIGH (ref 70–99)

## 2018-05-04 LAB — BASIC METABOLIC PANEL
ANION GAP: 11 (ref 5–15)
BUN: 70 mg/dL — ABNORMAL HIGH (ref 8–23)
CALCIUM: 8.6 mg/dL — AB (ref 8.9–10.3)
CO2: 33 mmol/L — AB (ref 22–32)
Chloride: 99 mmol/L (ref 98–111)
Creatinine, Ser: 1.85 mg/dL — ABNORMAL HIGH (ref 0.61–1.24)
GFR calc Af Amer: 39 mL/min — ABNORMAL LOW (ref >60)
GFR, EST NON AFRICAN AMERICAN: 34 mL/min — AB (ref >60)
Glucose, Bld: 171 mg/dL — ABNORMAL HIGH (ref 70–99)
Potassium: 4.3 mmol/L (ref 3.5–5.1)
Sodium: 143 mmol/L (ref 135–145)

## 2018-05-04 LAB — CBC
HCT: 28 % — ABNORMAL LOW (ref 39.0–52.0)
HEMOGLOBIN: 8.9 g/dL — AB (ref 13.0–17.0)
MCH: 29 pg (ref 26.0–34.0)
MCHC: 31.8 g/dL (ref 30.0–36.0)
MCV: 91.2 fL (ref 80.0–100.0)
Platelets: 226 10*3/uL (ref 150–400)
RBC: 3.07 MIL/uL — AB (ref 4.22–5.81)
RDW: 15.8 % — ABNORMAL HIGH (ref 11.5–15.5)
WBC: 17.5 10*3/uL — ABNORMAL HIGH (ref 4.0–10.5)
nRBC: 0 % (ref 0.0–0.2)

## 2018-05-04 LAB — HEPARIN LEVEL (UNFRACTIONATED): HEPARIN UNFRACTIONATED: 0.48 [IU]/mL (ref 0.30–0.70)

## 2018-05-04 LAB — MAGNESIUM: Magnesium: 2.5 mg/dL — ABNORMAL HIGH (ref 1.7–2.4)

## 2018-05-04 LAB — PHOSPHORUS: Phosphorus: 5.3 mg/dL — ABNORMAL HIGH (ref 2.5–4.6)

## 2018-05-04 MED ORDER — DEXMEDETOMIDINE HCL IN NACL 400 MCG/100ML IV SOLN
0.4000 ug/kg/h | INTRAVENOUS | Status: DC
  Administered 2018-05-04: 0.4 ug/kg/h via INTRAVENOUS
  Administered 2018-05-04: 0.7 ug/kg/h via INTRAVENOUS
  Administered 2018-05-05: 0.5 ug/kg/h via INTRAVENOUS
  Administered 2018-05-05: 0.7 ug/kg/h via INTRAVENOUS
  Administered 2018-05-05: 0.5 ug/kg/h via INTRAVENOUS
  Administered 2018-05-06: 0.7 ug/kg/h via INTRAVENOUS
  Filled 2018-05-04 (×7): qty 100

## 2018-05-04 MED ORDER — METOPROLOL TARTRATE 25 MG PO TABS
12.5000 mg | ORAL_TABLET | Freq: Two times a day (BID) | ORAL | Status: DC
  Filled 2018-05-04 (×2): qty 1

## 2018-05-04 NOTE — Progress Notes (Signed)
[place] for heparin dosing for this 47 YOM admitted on 11/10 with acute AMS/delirium and suspected pneumonia. Developed AFRVR on 11/11 requiring anticoagulation.  Indication: Anticoagulation-Afib   No Known Allergies  Patient Measurements: Height: 5\' 11"  (180.3 cm) Weight: 184 lb 15.5 oz (83.9 kg) IBW/kg (Calculated) : 75.3 Heparin Dosing Weight: 83.4 kg (actual BW)  Vital Signs: Temp: 98.6 F (37 C) (11/18 0759) Temp Source: Axillary (11/18 0759) BP: 169/81 (11/18 1100) Pulse Rate: 107 (11/18 1100)  Labs: Recent Labs    05/02/18 0201  05/02/18 1038 05/03/18 0444 05/03/18 1403 05/03/18 2200 05/04/18 0558  HGB  --    < > 9.9* 9.2*  --   --  8.9*  HCT  --   --  30.5* 28.8*  --   --  28.0*  PLT  --   --  214 172  --   --  226  APTT  --   --   --   --  78*  --   --   HEPARINUNFRC 0.35  --  0.48 0.86* 0.60 0.51 0.48  CREATININE 1.96*  --   --  1.89*  --   --   --    < > = values in this interval not displayed.    Estimated Creatinine Clearance: 35.4 mL/min (A) (by C-G formula based on SCr of 1.89 mg/dL (H)).   Medical History: Past Medical History:  Diagnosis Date  . Abdominal aortic aneurysm (AAA) ([place]) 05/13/15   seen on ct scan  . 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 Anemia   . 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 CAD (coronary artery disease)   . Cataract cortical, senile   . CHF (congestive heart failure) ([place])   . Chronic hoarseness   . Exocrine pancreatic insufficiency   . H. pylori infection   . History of hepatitis   . Hyperlipidemia   . Hypertension   . 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 Prostate CA [facility])     Assessment: Patient will require anticoagulation for stroke prevention related to newly developed A-fib. CHA2DS2-VASc is at least 4 (age >21, HTN, CHF). Patient currently receiving heparin at 900 units/hr.   11/17 @  2233 HL  0.51. Level now therapeutic x 2 levels.  11/18 AM HL 0.58, CBC  stable   Goal of Therapy:  Heparin level goal = 0.3-0.7  Plan:  Will continue current infusion rate of 900 units/hr. Recheck HL and CBC daily with AM labs per protocol.   Pharmacy will continue to monitor and adjust per consult.   Paulina Fusi, PharmD, BCPS 05/04/2018 11:46 AM

## 2018-05-04 NOTE — Plan of Care (Signed)
Patient remains intubated. Tolerating patient care.  PRN versed given x 1 during bath.  Remains on Propofol to maintain RASS.  No acute concerns. Will continue to monitor. Good urine output.

## 2018-05-04 NOTE — Progress Notes (Signed)
Progress Note  Patient Name: Matthew Zamora Date of Encounter: 05/04/2018  Primary Cardiologist: [facility]  Subjective   Remains intubated. Sedation weaned. Seroquel has helped significantly given his underlying severe anxiety. Remains on heparin and amiodarone gtts.   The patient continues to be in sinus rhythm with frequent PACs.  Inpatient Medications    Scheduled Meds: . aspirin  81 mg Per Tube Daily  . atorvastatin  40 mg Per Tube Daily  . budesonide (PULMICORT) nebulizer solution  0.5 mg Nebulization BID  . chlorhexidine gluconate (MEDLINE KIT)  15 mL Mouth Rinse BID  . clopidogrel  75 mg Per Tube Daily  . feeding supplement (VITAL HIGH PROTEIN)  1,000 mL Per Tube Q24H  . furosemide  40 mg Intravenous Q12H  . insulin aspart  0-9 Units Subcutaneous Q4H  . ipratropium-albuterol  3 mL Nebulization Q4H  . lipase/protease/amylase  12,000 Units Oral Q8H  . mouth rinse  15 mL Mouth Rinse 10 times per day  . methylPREDNISolone (SOLU-MEDROL) injection  40 mg Intravenous Q12H  . metoprolol tartrate  5 mg Intravenous Q6H  . oxyCODONE-acetaminophen  1 tablet Oral Q6H  . pantoprazole (PROTONIX) IV  40 mg Intravenous Daily  . QUEtiapine  100 mg Oral BID  . sodium chloride flush  10-40 mL Intracatheter Q12H   Continuous Infusions: . amiodarone 60 mg/hr (05/04/18 0721)  . ampicillin-sulbactam (UNASYN) IV Stopped (05/04/18 0426)  . heparin 900 Units/hr (05/04/18 0700)  . propofol (DIPRIVAN) infusion 40 mcg/kg/min (05/04/18 0700)   PRN Meds: acetaminophen **OR** acetaminophen, bisacodyl, bisacodyl, fentaNYL (SUBLIMAZE) injection, fentaNYL (SUBLIMAZE) injection, haloperidol lactate, midazolam, ondansetron **OR** ondansetron (ZOFRAN) IV, sennosides, sodium chloride flush   Vital Signs    Vitals:   05/04/18 0300 05/04/18 0318 05/04/18 0400 05/04/18 0700  BP: 123/64  (!) 149/77 (!) 147/58  Pulse: 60  81 73  Resp: (!) 8  (!) 9 17  Temp:   99.8 F (37.7 C)   TempSrc:   Axillary    SpO2: 100% 100% 100% 100%  Weight:      Height:        Intake/Output Summary (Last 24 hours) at 05/04/2018 0752 Last data filed at 05/04/2018 0700 Gross per 24 hour  Intake 2471.65 ml  Output 3705 ml  Net -1233.35 ml   Filed Weights   04/30/18 0500 05/02/18 0500 05/03/18 0500  Weight: 86 kg 83.9 kg 83.9 kg    Telemetry    Normal sinus rhythm with frequent PACs and occasional PVCs.- Personally Reviewed  ECG    n/a - Personally Reviewed  Physical Exam   GEN: Critically ill appearing. Will open eyes.  Neck: JVD difficult to assess secondary to mechanical ventilation. Cardiac: RRR, no murmurs, rubs, or gallops.  Respiratory: Diminished breath sounds bilaterally. Intubated.  GI: Soft, nontender, non-distended.   MS: Trace lower extremity edema with 1+ upper extremity edema L>R; No deformity. Neuro:  Intubated.  Psych: Intubated.  Labs    Chemistry Recent Labs  Lab 04/28/18 0313 04/29/18 0302  04/30/18 0423  05/01/18 0359 05/02/18 0201 05/03/18 0444  NA 137 140   < > 143   < > 141 145 142  K 4.0 3.5   < > 3.7   < > 3.4* 3.9 3.8  CL 103 104   < > 106   < > 100 103 99  CO2 24 26   < > 26   < > '27 27 29  ' GLUCOSE 122* 108*   < > 164*   < >  213* 151* 210*  BUN 33* 33*   < > 33*   < > 40* 52* 57*  CREATININE 2.52* 2.05*   < > 1.52*   < > 1.55* 1.96* 1.89*  CALCIUM 8.2* 8.4*   < > 8.5*   < > 8.2* 8.8* 8.5*  PROT 5.8* 5.5*  --  5.9*  --   --   --   --   ALBUMIN 3.0* 2.6*  --  2.7*  --   --   --   --   AST 74* 66*  --  52*  --   --   --   --   ALT 28 30  --  35  --   --   --   --   ALKPHOS 68 58  --  59  --   --   --   --   BILITOT 0.6 0.6  --  0.6  --   --   --   --   GFRNONAA 23* 30*   < > 43*   < > 42* 31* 33*  GFRAA 27* 35*   < > 50*   < > 48* 36* 38*  ANIONGAP 10 10   < > 11   < > '14 15 14   ' < > = values in this interval not displayed.     Hematology Recent Labs  Lab 05/02/18 1038 05/03/18 0444 05/04/18 0558  WBC 14.5* 10.6* 17.5*  RBC 3.41* 3.16*  3.07*  HGB 9.9* 9.2* 8.9*  HCT 30.5* 28.8* 28.0*  MCV 89.4 91.1 91.2  MCH 29.0 29.1 29.0  MCHC 32.5 31.9 31.8  RDW 15.2 15.6* 15.8*  PLT 214 172 226    Cardiac Enzymes Recent Labs  Lab 04/27/18 1125 04/27/18 1732 04/28/18 0313  TROPONINI 0.43* 0.57* 0.44*   No results for input(s): TROPIPOC in the last 168 hours.   BNP Recent Labs  Lab 04/27/18 1054 04/29/18 0302  BNP 799.0* 889.0*     DDimer No results for input(s): DDIMER in the last 168 hours.   Radiology    No results found.  Cardiac Studies   Echo 04/27/2018: Study Conclusions  - Left ventricle: The cavity size was normal. There was moderate focal basal hypertrophy of the septum. Systolic function was mildly to moderately reduced. The estimated ejection fraction was in the range of 40% to 45%. Images were inadequate for LV wall motion assessment. The study is not technically sufficient to allow evaluation of LV diastolic function. - Mitral valve: There was mild regurgitation. - Left atrium: The atrium was moderately dilated. - Right atrium: The atrium was mildly dilated. - Pulmonary arteries: Systolic pressure was moderately increased. PA peak pressure: 50 mm Hg (S). - Inferior vena cava: The vessel was dilated. The respirophasic diameter changes were blunted (<50%), consistent with elevated central venous pressure.  Patient Profile     77 y.o. male with history of coronary artery disease, chronic systolic heart failure, ventricular tachycardia status post ICD, and chronic kidney disease stage III,whowas admitted due to acute respiratory failure thought to be due to pneumonia and developed intermittent episodes of atrial fibrillation with rapid ventricular response.  Assessment & Plan    1. Acute respiratory failure with hypoxia and hypercapnia: -Reintubated on 11/14 in the afternoon in the setting of hypoxia and hypercapnia -Remains on 30% FiO2, weaning -Management per critical  care -The primary event is likely pneumonia but that patient had 10 L positive balance since his admission and thus he  was started on furosemide intravenously to assist with extubation.  Continue same dose for now 40 mg twice daily as long as renal function remains stable.  2. PAF with RVR: He has been going in and out of atrial fibrillation and usually depending on underlying respiratory distress. -Continue IV amiodarone full dose -He is currently on IV metoprolol every 6 hours which can be switched to oral once the patient is extubated. -Continue IV heparin with plans to transition to oral anticoagulation given his CHADS2VASc of 5 once he is taking orals and it is clear that no intervention will be needed -Once he is started on oral anticoagulation will discontinue aspirin   3. CAD with elevated troponin: -Mild troponin bump in the setting of the above -In the setting of his acute on chronic kidney disease and recurrent respiratory failure he has not been felt to be a good candidate for ischemic evaluation at this time -Remains on heparin as above -Continue aspirin and Plavix along with beta-blocker -Resume statin once he is able to take p.o. -Outpatient ischemic cardiac evaluation can be considered.  4. HFrEF: -Good diuresis over the last 2 days.  -If needed could check CVP's via his PICC line -Continue IV beta-blocker -Continue IV Lasix   5. Acute on CKD stage III: -Possibly in the setting of ATN -Renal function is stable.    For questions or updates, please contact [place] Please consult [email or web address] for contact info under Cardiology/STEMI.    Signed, Kathlyn Sacramento, MD [facility] 05/04/2018, 7:52 AM

## 2018-05-04 NOTE — Care Management Note (Signed)
Case Management Note  Patient Details  Name: Matthew Zamora MRN: [id number] Date of Birth: 07/28/1940  Subjective/Objective:       RNCM following up on patient plan of care.  Daughter in law is at the bedside, wife of [name], pt's son.  Patient is intubated and sedated.  [name] and his wife have been visibly present since patient admission.  [name] has 2 other brothers that are now involved in family decision making.  The patient does not have a designated POA.  Per the Daughter in law there is tension between the brother's about plan of care and decision making.  RNCM will cont to follow to address discharge needs- RNCM is available for questions if the family has any regarding discharge planning. Doran Clay RN BSN [phone]                 Action/Plan:   Expected Discharge Date:                  Expected Discharge Plan:     In-House Referral:     Discharge planning Services  CM Consult  Post Acute Care Choice:    Choice offered to:     DME Arranged:    DME Agency:     HH Arranged:    HH Agency:     Status of Service:  In process, will continue to follow  If discussed at Long Length of Stay Meetings, dates discussed:    Additional Comments:  Shelbie Hutching, RN 05/04/2018, 2:34 PM

## 2018-05-04 NOTE — Progress Notes (Signed)
Male called to speak with the nurse and identified himself and Juanda Crumble the patient's son.  RN informed him she was in patient rounds and asked male to call back in 30 minutes.  The man raised his voice and stated "what are y'all doing up there, I called three times last night and nobody could tell me anything about my dad, y'all are just letting him lay there and die."  This RN instructed him that the staff was caring for his dad and that we are not always in a place we can talk on the phone because we are doing patient care and caring for his father.  Charles started raising his voice again and threatening nurse therefore this RN informed him that she was getting off the phone because he was yelling.  Man called back to ICU and yelled at Santiago Glad, Network engineer and threatened to come and bring his lawyer and talk with administration.  Spoke with patient's family at bedside and decision made to not allow Juanda Crumble to visit because he agitates the patient and ventilator wean and sedation wean in progress. Network engineer and charge nurse aware and notified security.

## 2018-05-04 NOTE — Progress Notes (Addendum)
Name: Matthew Zamora MRN: [id number] DOB: [birth year]     CONSULTATION DATE: 04/26/2018  Subjective & Objectives: On Amiodarone and Heparin gtt.  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  has a past surgical history that includes Prostate surgery; Insertion of ICD; Cataract extraction; Tonsillectomy; Colonoscopy (10/06/2014, 09/18/2004, 03/14/2009); Flexible sigmoidoscopy (08/26/1990); and Esophagogastroduodenoscopy (10/06/2014, 03/18/2001, 03/14/2009). Prior to Admission medications   Medication Sig Start Date End Date Taking? Authorizing Provider  aspirin EC 81 MG tablet Take 81 mg by mouth daily.   Yes [provider]  atorvastatin (LIPITOR) 40 MG tablet Take 40 mg by mouth daily.   Yes [provider]  clopidogrel (PLAVIX) 75 MG tablet Take 75 mg by mouth daily. 04/15/18  Yes [provider]  furosemide (LASIX) 40 MG tablet Take 20 mg by mouth daily.   Yes [provider]  lisinopril (PRINIVIL,ZESTRIL) 5 MG tablet Take 5 mg by mouth daily.   Yes [provider]  magnesium (MAGTAB) 84 MG (7MEQ) TBCR SR tablet Take 84 mg by mouth daily.   Yes [provider]  Melatonin 1 MG TABS Take 1 tablet by mouth at bedtime as needed. 12/27/13  Yes [provider]  metoprolol succinate (TOPROL-XL) 100 MG 24 hr tablet Take 100 mg by mouth daily. Take with or immediately following a meal.   Yes [provider]  Multiple Vitamins-Minerals (MULTIVITAMIN WITH MINERALS) tablet Take 1 tablet by mouth daily.    Yes [provider]  omeprazole (PRILOSEC) 20 MG capsule Take 20 mg by mouth daily.   Yes [provider]  oxybutynin (DITROPAN XL) 15 MG 24 hr tablet Take 15 mg by mouth at bedtime.   Yes [provider]  lipase/protease/amylase (CREON) 12000 units CPEP capsule Take 24,000 Units by mouth 2 (two) times daily at 10 am and 4 pm.    [provider]   No Known Allergies  FAMILY HISTORY:  family history includes Heart attack in his father and mother. SOCIAL HISTORY:  reports that he quit smoking about 18 years ago. His smoking use included cigarettes. He has a 38.00 pack-year smoking history. He has never used smokeless tobacco. He reports that he does not drink alcohol.  REVIEW OF SYSTEMS:   Unable to obtain due to critical illness   VITAL SIGNS: Temp:  [98.4 F (36.9 C)-99.8 F (37.7 C)] 99.1 F (37.3 C) (11/18 1231) Pulse Rate:  [60-107] 90 (11/18 1231) Resp:  [8-24] 14 (11/18 1231) BP: (114-169)/(50-93) 162/65 (11/18 1231) SpO2:  [98 %-100 %] 100 % (11/18 1231) FiO2 (%):  [30 %] 30 % (11/18 1133)  Physical Examination:  RASS of -2 and following simple commands On vent, no distress, BEAE and no rales S1 & S2 audible and no murmur Benign abdomen with feeble peristalses No edema   ASSESSMENT / PLAN:  Acute respiratory failure s/p failed extubation and had to be reintubated because of agitation. Family at the bedside confirmed history of panic attacks and anxiety. -Start on Precedex, daily SAT + SBT and continue with vent support.   Altered mental status (agitation) with possible steroid induced psychosis / metabolic encephalopathy. CT head -ve for acute intracranial abnormality. -Taper  off steroids -Monitor neuro status.  Pneumonia. Bibasilar airspace disease -Empiric Unasyn. S/p Vanc + Zithro + Zosyn -Monitor CXR + CBC + FIO2  Septic shock. Off pressers -Monitor hemodynamics  A fib with RVR, elevated Trop with demand vs. Supply  mismatch. Base line  HFrEF 40-45% and CAD -Amiodarone + Heparin gtt + ASA + Plavix + BB+ Statin  AKI on CKD St III. -management as per renal  HTN.  -Optimize antihypertensives and monitor hemodynamics  Pancreatic insufficiency -Pancreatic enzymes supplements  Anemia -Keep HB > 7 gm/dl.  Full code  DVT & GI prophylaxis.Continue with supportive care  Critical care time 40 min

## 2018-05-04 NOTE — Progress Notes (Addendum)
Patient ID: Matthew Zamora, male   DOB: Jan 23, 1941, 77 y.o.   MRN: [id number]  [employer] PROGRESS NOTE  MUHAMMAD VACCA VOJ:[id number] DOB: [birth year] DOA: 04/26/2018 PCP: [place] Pink, MD  HPI/Subjective Off sedation, on weaning trial  Objective: Vitals:   05/04/18 1031 05/04/18 1100  BP: 133/65 (!) 169/81  Pulse: (!) 106 (!) 107  Resp: 20 16  Temp:    SpO2: 98% 98%    Filed Weights   04/30/18 0500 05/02/18 0500 05/03/18 0500  Weight: 86 kg 83.9 kg 83.9 kg    ROS: Review of Systems  Unable to perform ROS: Intubated   Exam: Physical Exam  HENT:  Nose: No mucosal edema.  Mouth/Throat: No oropharyngeal exudate or posterior oropharyngeal edema.  Unable to look into mouth  Eyes: Conjunctivae and lids are normal.  Neck: No JVD present. Carotid bruit is not present. No edema present. No thyroid mass and no thyromegaly present.  Cardiovascular: Regular rhythm, S1 normal, S2 normal and normal heart sounds. Exam reveals no gallop.  No murmur heard. Pulses:      Dorsalis pedis pulses are 2+ on the right side, and 2+ on the left side.  Respiratory: No respiratory distress. He has decreased breath sounds in the right middle field, the right lower field, the left middle field and the left lower field. He has no wheezes. He has no rhonchi. He has no rales.  GI: Soft. Bowel sounds are normal. There is no tenderness.  Musculoskeletal:       Right ankle: He exhibits no swelling.       Left ankle: He exhibits no swelling.  Lymphadenopathy:    He has no cervical adenopathy.  Neurological:  on ventilator  Skin: Skin is warm. No rash noted. Nails show no clubbing.  Psychiatric:  Unable to assess secondary to intubation    Data Reviewed: Basic Metabolic Panel: Recent Labs  Lab 04/30/18 0423 04/30/18 2359 05/01/18 0359 05/01/18 1313 05/01/18 1537 05/01/18 1719 05/02/18 0201 05/02/18 1655 05/03/18 0444 05/04/18 0558  NA 143 140 141  --   --   --  145  --   142  --   K 3.7 3.6 3.4*  --   --   --  3.9  --  3.8  --   CL 106 102 100  --   --   --  103  --  99  --   CO2 '26 28 27  ' --   --   --  27  --  29  --   GLUCOSE 164* 251* 213*  --   --   --  151*  --  210*  --   BUN 33* 39* 40*  --   --   --  52*  --  57*  --   CREATININE 1.52* 1.67* 1.55*  --   --   --  1.96*  --  1.89*  --   CALCIUM 8.5* 8.1* 8.2*  --   --   --  8.8*  --  8.5*  --   MG 2.1  --   --  2.3 2.3 2.4 2.3 2.1  --  2.5*  PHOS  --   --   --  3.5  --  2.8 3.5 3.9  --  5.3*   Liver Function Tests: Recent Labs  Lab 04/28/18 0313 04/29/18 0302 04/30/18 0423  AST 74* 66* 52*  ALT 28 30 35  ALKPHOS 68 58 59  BILITOT 0.6 0.6 0.6  PROT 5.8* 5.5* 5.9*  ALBUMIN 3.0* 2.6* 2.7*   CBC: Recent Labs  Lab 04/29/18 0302 04/30/18 0423 04/30/18 2359 05/01/18 0359 05/02/18 1038 05/03/18 0444 05/04/18 0558  WBC 17.5* 9.7 10.8* 10.4 14.5* 10.6* 17.5*  NEUTROABS 15.3* 9.0*  --   --   --   --   --   HGB 9.3* 8.5* 8.2* 9.0* 9.9* 9.2* 8.9*  HCT 28.6* 26.3* 25.0* 26.8* 30.5* 28.8* 28.0*  MCV 89.4 88.6 90.3 89.9 89.4 91.1 91.2  PLT 146* 126* 124* 132* 214 172 226   Cardiac Enzymes: Recent Labs  Lab 04/27/18 1732 04/28/18 0313  TROPONINI 0.57* 0.44*   BNP (last 3 results) Recent Labs    04/26/18 0030 04/27/18 1054 04/29/18 0302  BNP 717.0* 799.0* 889.0*    CBG: Recent Labs  Lab 05/03/18 1947 05/03/18 2352 05/04/18 0409 05/04/18 0729 05/04/18 1120  GLUCAP 172* 176* 181* 134* 166*    Recent Results (from the past 240 hour(s))  Blood culture (routine x 2)     Status: None   Collection Time: 04/26/18  1:54 AM  Result Value Ref Range Status   Specimen Description BLOOD LEFT FATTY CASTS  Final   Special Requests   Final    BOTTLES DRAWN AEROBIC AND ANAEROBIC Blood Culture adequate volume   Culture   Final    NO GROWTH 5 DAYS Performed at [facility], [address] [name], [place] [address]    Report Status 05/01/2018 FINAL  Final  Blood culture  (routine x 2)     Status: None   Collection Time: 04/26/18  1:55 AM  Result Value Ref Range Status   Specimen Description BLOOD LEFT ASSIST CONTROL  Final   Special Requests   Final    BOTTLES DRAWN AEROBIC AND ANAEROBIC Blood Culture results may not be optimal due to an excessive volume of blood received in culture bottles   Culture   Final    NO GROWTH 5 DAYS Performed at [facility], [address] [place], [place] [address]    Report Status 05/01/2018 FINAL  Final  MRSA PCR Screening     Status: None   Collection Time: 04/26/18  2:54 AM  Result Value Ref Range Status   MRSA by PCR NEGATIVE NEGATIVE Final    Comment:        The GeneXpert MRSA Assay (FDA approved for NASAL specimens only), is one component of a comprehensive MRSA colonization surveillance program. It is not intended to diagnose MRSA infection nor to guide or monitor treatment for MRSA infections. Performed at [facility], [address] [place], [place] [address]   Culture, respiratory (non-expectorated)     Status: None   Collection Time: 04/27/18 10:06 AM  Result Value Ref Range Status   Specimen Description   Final    TRACHEAL ASPIRATE Performed at [facility], [address] [place], [place] [address]    Special Requests   Final    NONE Performed at [facility], [address] [place], [place] [address]    Gram Stain   Final    FEW WBC PRESENT, PREDOMINANTLY PMN RARE GRAM POSITIVE RODS    Culture   Final    FEW Consistent with normal respiratory flora. Performed at [facility], [place] [address], [place], [place] [address]    Report Status 04/29/2018 FINAL  Final  CULTURE, BLOOD (ROUTINE X 2) w Reflex to ID Panel     Status: None  Collection Time: 04/28/18 11:52 AM  Result Value Ref Range Status   Specimen Description BLOOD LEFT HAND  Final   Special Requests   Final    BOTTLES DRAWN AEROBIC AND ANAEROBIC Blood Culture results may not be  optimal due to an inadequate volume of blood received in culture bottles   Culture   Final    NO GROWTH 5 DAYS Performed at [facility], [address] [place], [place] [address]    Report Status 05/03/2018 FINAL  Final  Culture, blood (Routine X 2) w Reflex to ID Panel     Status: None   Collection Time: 04/28/18  1:47 PM  Result Value Ref Range Status   Specimen Description BLOOD BLOOD LEFT HAND  Final   Special Requests   Final    BOTTLES DRAWN AEROBIC AND ANAEROBIC Blood Culture adequate volume   Culture   Final    NO GROWTH 5 DAYS Performed at [facility], [address], [place], [place] [address]    Report Status 05/03/2018 FINAL  Final     Studies: No results found.  Scheduled Meds: . aspirin  81 mg Per Tube Daily  . atorvastatin  40 mg Per Tube Daily  . budesonide (PULMICORT) nebulizer solution  0.5 mg Nebulization BID  . chlorhexidine gluconate (MEDLINE KIT)  15 mL Mouth Rinse BID  . clopidogrel  75 mg Per Tube Daily  . feeding supplement (VITAL HIGH PROTEIN)  1,000 mL Per Tube Q24H  . furosemide  40 mg Intravenous Q12H  . insulin aspart  0-9 Units Subcutaneous Q4H  . ipratropium-albuterol  3 mL Nebulization Q4H  . lipase/protease/amylase  12,000 Units Oral Q8H  . mouth rinse  15 mL Mouth Rinse 10 times per day  . methylPREDNISolone (SOLU-MEDROL) injection  40 mg Intravenous Q12H  . metoprolol tartrate  5 mg Intravenous Q6H  . oxyCODONE-acetaminophen  1 tablet Oral Q6H  . pantoprazole (PROTONIX) IV  40 mg Intravenous Daily  . QUEtiapine  100 mg Oral BID  . sodium chloride flush  10-40 mL Intracatheter Q12H   Continuous Infusions: . amiodarone 60 mg/hr (05/04/18 0900)  . ampicillin-sulbactam (UNASYN) IV 3 g (05/04/18 1036)  . dexmedetomidine (PRECEDEX) IV infusion    . heparin 900 Units/hr (05/04/18 0900)  . propofol (DIPRIVAN) infusion Stopped (05/04/18 1031)    Assessment/Plan:  1. Septic shock secondary to pneumonia.  Currently on  Unasyn.  Off pressors currently. pending cultures. 2. Acute hypoxic respiratory failure with respiratory acidosis.  Continue mechanical ventilation.  Weaning trials as per ICU attending.  Spontaneous breathing and awakening trials. Off sedation 3. Atrial fibrillation with rapid ventricular response.  Echocardiogram showed an EF of 40 to 45%.  Continue amiodarone and heparin drip.  Converted to sinus rhythm this morning. 4. Acute encephalopathy.  CT scan negative today.  Need continued monitoring on mental status. 5. Elevated troponin secondary to demand ischemia with septic shock.  On aspirin. 6. Acute kidney injury on chronic kidney disease stage III.  Creatinine continued to improve to 1.8 7. Pancreatic insufficiency on pancreatic enzymes 8. Anemia of chronic disease.  Hemoglobin today 8.9 9. Hyperlipidemia unspecified on Lipitor 10. History of prostate cancer 11. History of chronic combined systolic diastolic congestive heart failure. Lasix 40 mg IV bid, metoprolol 5 mg IV Q 6 hrs 12. Abdomina aortic aneurysm: outpt f/up with vascular surgery 13. Secondary hyperparathyroidism: mgmt per Nephro  Code Status: Full code    Code Status Orders  (From admission, onward)  Start     Ordered   04/26/18 0254  Full code  Continuous     04/26/18 0253        Code Status History    This patient has a current code status but no historical code status.     Family Communication: Spoke with family at bedside today Disposition Plan: To be determined  Consultants:  Critical care specialist  Cardiology  Nephrology  Antibiotics: -Unasyn  Time spent: 15 minutes.  Case discussed with nursing staff  [employer]

## 2018-05-04 NOTE — Progress Notes (Signed)
[facility]  ROUNDING NOTE   Subjective:   Family at bedside.   Objective:  Vital signs in last 24 hours:  Temp:  [98.4 F (36.9 C)-99.8 F (37.7 C)] 99.1 F (37.3 C) (11/18 1231) Pulse Rate:  [57-107] 90 (11/18 1231) Resp:  [8-24] 14 (11/18 1231) BP: (114-169)/(50-93) 162/65 (11/18 1231) SpO2:  [98 %-100 %] 100 % (11/18 1231) FiO2 (%):  [30 %] 30 % (11/18 1133)  Weight change:  Filed Weights   04/30/18 0500 05/02/18 0500 05/03/18 0500  Weight: 86 kg 83.9 kg 83.9 kg    Intake/Output: I/O last 3 completed shifts: In: 4159.5 [I.V.:2179.5; NG/GT:1380; IV Piggyback:600.1] Out: 8016 [Urine:5580; Emesis/NG output:40]   Intake/Output this shift:  Total I/O In: 125.6 [I.V.:125.6] Out: 400 [Urine:400]  Physical Exam: General: Critically ill appearing  Head: ETT  Eyes: Anicteric  Neck: Supple, trachea midline  Lungs:  PSV FiO2 30%  Heart: S1S2 no rubs  Abdomen:  Soft, nontender, bowel sounds present  Extremities: no peripheral edema.  Neurologic: Intubated/sedated  Skin: No lesions       Basic Metabolic Panel: Recent Labs  Lab 04/30/18 2359 05/01/18 0359 05/01/18 1313 05/01/18 1537 05/01/18 1719 05/02/18 0201 05/02/18 1655 05/03/18 0444 05/04/18 0558 05/04/18 0600  NA 140 141  --   --   --  145  --  142  --  143  K 3.6 3.4*  --   --   --  3.9  --  3.8  --  4.3  CL 102 100  --   --   --  103  --  99  --  99  CO2 28 27  --   --   --  27  --  29  --  33*  GLUCOSE 251* 213*  --   --   --  151*  --  210*  --  171*  BUN 39* 40*  --   --   --  52*  --  57*  --  70*  CREATININE 1.67* 1.55*  --   --   --  1.96*  --  1.89*  --  1.85*  CALCIUM 8.1* 8.2*  --   --   --  8.8*  --  8.5*  --  8.6*  MG  --   --  2.3 2.3 2.4 2.3 2.1  --  2.5*  --   PHOS  --   --  3.5  --  2.8 3.5 3.9  --  5.3*  --     Liver Function Tests: Recent Labs  Lab 04/28/18 0313 04/29/18 0302 04/30/18 0423  AST 74* 66* 52*  ALT 28 30 35  ALKPHOS 68 58 59  BILITOT 0.6 0.6  0.6  PROT 5.8* 5.5* 5.9*  ALBUMIN 3.0* 2.6* 2.7*   No results for input(s): LIPASE, AMYLASE in the last 168 hours. Recent Labs  Lab 04/29/18 0302  AMMONIA 21    CBC: Recent Labs  Lab 04/29/18 0302 04/30/18 0423 04/30/18 2359 05/01/18 0359 05/02/18 1038 05/03/18 0444 05/04/18 0558  WBC 17.5* 9.7 10.8* 10.4 14.5* 10.6* 17.5*  NEUTROABS 15.3* 9.0*  --   --   --   --   --   HGB 9.3* 8.5* 8.2* 9.0* 9.9* 9.2* 8.9*  HCT 28.6* 26.3* 25.0* 26.8* 30.5* 28.8* 28.0*  MCV 89.4 88.6 90.3 89.9 89.4 91.1 91.2  PLT 146* 126* 124* 132* 214 172 226    Cardiac Enzymes: Recent Labs  Lab 04/27/18 1732 04/28/18 0313  TROPONINI 0.57* 0.44*    BNP: Invalid input(s): POCBNP  CBG: Recent Labs  Lab 05/03/18 1947 05/03/18 2352 05/04/18 0409 05/04/18 0729 05/04/18 1120  GLUCAP 172* 176* 181* 134* 166*    Microbiology: Results for orders placed or performed during the hospital encounter of 04/26/18  Blood culture (routine x 2)     Status: None   Collection Time: 04/26/18  1:54 AM  Result Value Ref Range Status   Specimen Description BLOOD LEFT FATTY CASTS  Final   Special Requests   Final    BOTTLES DRAWN AEROBIC AND ANAEROBIC Blood Culture adequate volume   Culture   Final    NO GROWTH 5 DAYS Performed at [facility], [address] [place], [place] [address]    Report Status 05/01/2018 FINAL  Final  Blood culture (routine x 2)     Status: None   Collection Time: 04/26/18  1:55 AM  Result Value Ref Range Status   Specimen Description BLOOD LEFT ASSIST CONTROL  Final   Special Requests   Final    BOTTLES DRAWN AEROBIC AND ANAEROBIC Blood Culture results may not be optimal due to an excessive volume of blood received in culture bottles   Culture   Final    NO GROWTH 5 DAYS Performed at [facility], [address] [place], [place] [address]    Report Status 05/01/2018 FINAL  Final  MRSA PCR Screening     Status: None   Collection Time: 04/26/18  2:54  AM  Result Value Ref Range Status   MRSA by PCR NEGATIVE NEGATIVE Final    Comment:        The GeneXpert MRSA Assay (FDA approved for NASAL specimens only), is one component of a comprehensive MRSA colonization surveillance program. It is not intended to diagnose MRSA infection nor to guide or monitor treatment for MRSA infections. Performed at [facility], [address] [place], [place] [address]   Culture, respiratory (non-expectorated)     Status: None   Collection Time: 04/27/18 10:06 AM  Result Value Ref Range Status   Specimen Description   Final    TRACHEAL ASPIRATE Performed at [facility], [address], [place], [place] [address]    Special Requests   Final    NONE Performed at [facility], [address] [place], [place] [address]    Gram Stain   Final    FEW WBC PRESENT, PREDOMINANTLY PMN RARE GRAM POSITIVE RODS    Culture   Final    FEW Consistent with normal respiratory flora. Performed at [facility], [place] [address], [place], [place] [address]    Report Status 04/29/2018 FINAL  Final  CULTURE, BLOOD (ROUTINE X 2) w Reflex to ID Panel     Status: None   Collection Time: 04/28/18 11:52 AM  Result Value Ref Range Status   Specimen Description BLOOD LEFT HAND  Final   Special Requests   Final    BOTTLES DRAWN AEROBIC AND ANAEROBIC Blood Culture results may not be optimal due to an inadequate volume of blood received in culture bottles   Culture   Final    NO GROWTH 5 DAYS Performed at [facility], [address] [place], [place] [address]    Report Status 05/03/2018 FINAL  Final  Culture, blood (Routine X 2) w Reflex to ID Panel     Status: None   Collection Time: 04/28/18  1:47 PM  Result Value Ref Range  Status   Specimen Description BLOOD BLOOD LEFT HAND  Final   Special Requests   Final    BOTTLES DRAWN AEROBIC AND ANAEROBIC Blood Culture adequate volume   Culture   Final    NO GROWTH 5  DAYS Performed at [facility], [address] [place], [place] [address]    Report Status 05/03/2018 FINAL  Final    Coagulation Studies: No results for input(s): LABPROT, INR in the last 72 hours.  Urinalysis: No results for input(s): COLORURINE, LABSPEC, PHURINE, GLUCOSEU, HGBUR, BILIRUBINUR, KETONESUR, PROTEINUR, UROBILINOGEN, NITRITE, LEUKOCYTESUR in the last 72 hours.  Invalid input(s): APPERANCEUR    Imaging: No results found.   Medications:   . amiodarone 60 mg/hr (05/04/18 0900)  . ampicillin-sulbactam (UNASYN) IV 3 g (05/04/18 1036)  . dexmedetomidine (PRECEDEX) IV infusion    . heparin 900 Units/hr (05/04/18 0900)  . propofol (DIPRIVAN) infusion Stopped (05/04/18 1031)   . aspirin  81 mg Per Tube Daily  . atorvastatin  40 mg Per Tube Daily  . budesonide (PULMICORT) nebulizer solution  0.5 mg Nebulization BID  . chlorhexidine gluconate (MEDLINE KIT)  15 mL Mouth Rinse BID  . clopidogrel  75 mg Per Tube Daily  . feeding supplement (VITAL HIGH PROTEIN)  1,000 mL Per Tube Q24H  . furosemide  40 mg Intravenous Q12H  . insulin aspart  0-9 Units Subcutaneous Q4H  . ipratropium-albuterol  3 mL Nebulization Q4H  . lipase/protease/amylase  12,000 Units Oral Q8H  . mouth rinse  15 mL Mouth Rinse 10 times per day  . methylPREDNISolone (SOLU-MEDROL) injection  40 mg Intravenous Q12H  . metoprolol tartrate  5 mg Intravenous Q6H  . oxyCODONE-acetaminophen  1 tablet Oral Q6H  . pantoprazole (PROTONIX) IV  40 mg Intravenous Daily  . QUEtiapine  100 mg Oral BID  . sodium chloride flush  10-40 mL Intracatheter Q12H   acetaminophen **OR** acetaminophen, bisacodyl, bisacodyl, fentaNYL (SUBLIMAZE) injection, fentaNYL (SUBLIMAZE) injection, haloperidol lactate, midazolam, ondansetron **OR** ondansetron (ZOFRAN) IV, sennosides, sodium chloride flush  Assessment/ Plan:  77 y.o. white male with a PMHx of abdominal aortic aneurysm, coronary artery disease, congestive heart  failure, exocrine pancreatic insufficiency, chronic kidney disease stage III baseline creatinine 1.6, prostate cancer, hypertension, who was admitted to [facility] on Acute respiratory failure with hypoxia and hypercapnia ([place]) [J96.01, J96.02] Chronic kidney disease, unspecified CKD stage [N18.9] Community acquired pneumonia, unspecified laterality [J18.9] Sepsis, due to unspecified organism, unspecified whether acute organ dysfunction present ([place]) [A41.9]  1.  Acute renal failure on chronic kidney disease stage III baseline creatinine 1.6. Nonoliguric urine output.  Creatinine improving to stable.   2.  Hypertension - furosemide IV  3.  Acute respiratory failure requiring mechanical ventilation and intubation.  - continue supportive care - steroids - Unasyn.    LOS: 8   11/18/201912:34 PM

## 2018-05-05 ENCOUNTER — Inpatient Hospital Stay: Payer: Medicare Other

## 2018-05-05 DIAGNOSIS — N183 Chronic kidney disease, stage 3 (moderate): Secondary | ICD-10-CM

## 2018-05-05 DIAGNOSIS — I5023 Acute on chronic systolic (congestive) heart failure: Secondary | ICD-10-CM

## 2018-05-05 DIAGNOSIS — N179 Acute kidney failure, unspecified: Secondary | ICD-10-CM

## 2018-05-05 DIAGNOSIS — D649 Anemia, unspecified: Secondary | ICD-10-CM

## 2018-05-05 LAB — CBC WITH DIFFERENTIAL/PLATELET
Abs Immature Granulocytes: 0.23 10*3/uL — ABNORMAL HIGH (ref 0.00–0.07)
BASOS PCT: 0 %
Basophils Absolute: 0 10*3/uL (ref 0.0–0.1)
EOS ABS: 0.1 10*3/uL (ref 0.0–0.5)
EOS PCT: 0 %
HEMATOCRIT: 20.8 % — AB (ref 39.0–52.0)
Hemoglobin: 6.7 g/dL — ABNORMAL LOW (ref 13.0–17.0)
Immature Granulocytes: 2 %
LYMPHS ABS: 1.1 10*3/uL (ref 0.7–4.0)
Lymphocytes Relative: 10 %
MCH: 29.5 pg (ref 26.0–34.0)
MCHC: 32.2 g/dL (ref 30.0–36.0)
MCV: 91.6 fL (ref 80.0–100.0)
MONO ABS: 0.7 10*3/uL (ref 0.1–1.0)
MONOS PCT: 6 %
Neutro Abs: 9.1 10*3/uL — ABNORMAL HIGH (ref 1.7–7.7)
Neutrophils Relative %: 82 %
PLATELETS: 155 10*3/uL (ref 150–400)
RBC: 2.27 MIL/uL — ABNORMAL LOW (ref 4.22–5.81)
RDW: 15.9 % — AB (ref 11.5–15.5)
WBC: 11.2 10*3/uL — ABNORMAL HIGH (ref 4.0–10.5)
nRBC: 0 % (ref 0.0–0.2)

## 2018-05-05 LAB — BLOOD GAS, ARTERIAL
Acid-Base Excess: 11.8 mmol/L — ABNORMAL HIGH (ref 0.0–2.0)
BICARBONATE: 34.8 mmol/L — AB (ref 20.0–28.0)
FIO2: 0.3
O2 SAT: 99.4 %
PATIENT TEMPERATURE: 37
PO2 ART: 135 mmHg — AB (ref 83.0–108.0)
PRESSURE SUPPORT: 5 cmH2O
pCO2 arterial: 38 mmHg (ref 32.0–48.0)
pH, Arterial: 7.57 — ABNORMAL HIGH (ref 7.350–7.450)

## 2018-05-05 LAB — CBC
HEMATOCRIT: 21.1 % — AB (ref 39.0–52.0)
Hemoglobin: 6.7 g/dL — ABNORMAL LOW (ref 13.0–17.0)
MCH: 29.1 pg (ref 26.0–34.0)
MCHC: 31.8 g/dL (ref 30.0–36.0)
MCV: 91.7 fL (ref 80.0–100.0)
PLATELETS: 159 10*3/uL (ref 150–400)
RBC: 2.3 MIL/uL — AB (ref 4.22–5.81)
RDW: 16 % — AB (ref 11.5–15.5)
WBC: 11 10*3/uL — AB (ref 4.0–10.5)
nRBC: 0 % (ref 0.0–0.2)

## 2018-05-05 LAB — BASIC METABOLIC PANEL
Anion gap: 9 (ref 5–15)
BUN: 81 mg/dL — ABNORMAL HIGH (ref 8–23)
CALCIUM: 8.1 mg/dL — AB (ref 8.9–10.3)
CO2: 32 mmol/L (ref 22–32)
Chloride: 101 mmol/L (ref 98–111)
Creatinine, Ser: 2.02 mg/dL — ABNORMAL HIGH (ref 0.61–1.24)
GFR calc non Af Amer: 30 mL/min — ABNORMAL LOW (ref >60)
GFR, EST AFRICAN AMERICAN: 35 mL/min — AB (ref >60)
GLUCOSE: 163 mg/dL — AB (ref 70–99)
POTASSIUM: 4.1 mmol/L (ref 3.5–5.1)
Sodium: 142 mmol/L (ref 135–145)

## 2018-05-05 LAB — HEPARIN LEVEL (UNFRACTIONATED): Heparin Unfractionated: 0.38 IU/mL (ref 0.30–0.70)

## 2018-05-05 LAB — PREPARE RBC (CROSSMATCH)

## 2018-05-05 LAB — HEMOGLOBIN AND HEMATOCRIT, BLOOD
HCT: 27.7 % — ABNORMAL LOW (ref 39.0–52.0)
HEMOGLOBIN: 9 g/dL — AB (ref 13.0–17.0)

## 2018-05-05 LAB — GLUCOSE, CAPILLARY
Glucose-Capillary: 149 mg/dL — ABNORMAL HIGH (ref 70–99)
Glucose-Capillary: 140 mg/dL — ABNORMAL HIGH (ref 70–99)
Glucose-Capillary: 111 mg/dL — ABNORMAL HIGH (ref 70–99)
Glucose-Capillary: 159 mg/dL — ABNORMAL HIGH (ref 70–99)
Glucose-Capillary: 112 mg/dL — ABNORMAL HIGH (ref 70–99)

## 2018-05-05 LAB — OCCULT BLOOD X 1 CARD TO LAB, STOOL: FECAL OCCULT BLD: POSITIVE — AB

## 2018-05-05 MED ORDER — PANTOPRAZOLE SODIUM 40 MG IV SOLR
40.0000 mg | Freq: Two times a day (BID) | INTRAVENOUS | Status: DC
  Administered 2018-05-05 – 2018-05-11 (×12): 40 mg via INTRAVENOUS
  Filled 2018-05-05 (×12): qty 40

## 2018-05-05 MED ORDER — FENTANYL CITRATE (PF) 100 MCG/2ML IJ SOLN
25.0000 ug | INTRAMUSCULAR | Status: DC | PRN
  Administered 2018-05-05 – 2018-05-07 (×3): 25 ug via INTRAVENOUS
  Administered 2018-05-07: 50 ug via INTRAVENOUS
  Filled 2018-05-05 (×2): qty 2

## 2018-05-05 MED ORDER — SODIUM CHLORIDE 0.9% IV SOLUTION
Freq: Once | INTRAVENOUS | Status: AC
  Administered 2018-05-05: 17:00:00 via INTRAVENOUS

## 2018-05-05 MED ORDER — DEXAMETHASONE SODIUM PHOSPHATE 4 MG/ML IJ SOLN
4.0000 mg | Freq: Three times a day (TID) | INTRAMUSCULAR | Status: AC
  Administered 2018-05-05 – 2018-05-06 (×3): 4 mg via INTRAVENOUS
  Filled 2018-05-05 (×3): qty 1

## 2018-05-05 MED ORDER — RACEPINEPHRINE HCL 2.25 % IN NEBU
0.5000 mL | INHALATION_SOLUTION | Freq: Once | RESPIRATORY_TRACT | Status: AC
  Administered 2018-05-05: 0.5 mL via RESPIRATORY_TRACT
  Filled 2018-05-05: qty 0.5

## 2018-05-05 NOTE — Progress Notes (Signed)
Patient ID: Matthew Zamora, male   DOB: 01-03-1941, 77 y.o.   MRN: [id number]  [employer] PROGRESS NOTE  Matthew Zamora WJX:[id number] DOB: 09/25/1940 DOA: 04/26/2018 PCP: Matthew Pink, MD  HPI/Subjective Extubated. sedated on precedex  Objective: Vitals:   05/05/18 1000 05/05/18 1030  BP: (!) 92/46 (!) 127/49  Pulse: 66 65  Resp: 16 14  Temp:    SpO2: 95% 98%    Filed Weights   05/02/18 0500 05/03/18 0500 05/05/18 0427  Weight: 83.9 kg 83.9 kg 82.5 kg    ROS: Review of Systems  Unable to perform ROS: Mental acuity   Exam: Physical Exam  Constitutional: He appears lethargic.  HENT:  Nose: No mucosal edema.  Mouth/Throat: No oropharyngeal exudate or posterior oropharyngeal edema.  Unable to look into mouth  Eyes: Conjunctivae and lids are normal.  Neck: No JVD present. Carotid bruit is not present. No edema present. No thyroid mass and no thyromegaly present.  Cardiovascular: Regular rhythm, S1 normal, S2 normal and normal heart sounds. Exam reveals no gallop.  No murmur heard. Pulses:      Dorsalis pedis pulses are 2+ on the right side, and 2+ on the left side.  Respiratory: No respiratory distress. He has decreased breath sounds in the right middle field, the right lower field, the left middle field and the left lower field. He has no wheezes. He has no rhonchi. He has no rales.  GI: Soft. Bowel sounds are normal. There is no tenderness.  Musculoskeletal:       Right ankle: He exhibits no swelling.       Left ankle: He exhibits no swelling.  Lymphadenopathy:    He has no cervical adenopathy.  Neurological: He appears lethargic.  sedated  Skin: Skin is warm. No rash noted. Nails show no clubbing.  Psychiatric:  sedated    Data Reviewed: Basic Metabolic Panel: Recent Labs  Lab 05/01/18 0359 05/01/18 1313 05/01/18 1537 05/01/18 1719 05/02/18 0201 05/02/18 1655 05/03/18 0444 05/04/18 0558 05/04/18 0600 05/05/18 0435  NA 141  --   --    --  145  --  142  --  143 142  K 3.4*  --   --   --  3.9  --  3.8  --  4.3 4.1  CL 100  --   --   --  103  --  99  --  99 101  CO2 27  --   --   --  27  --  29  --  33* 32  GLUCOSE 213*  --   --   --  151*  --  210*  --  171* 163*  BUN 40*  --   --   --  52*  --  57*  --  70* 81*  CREATININE 1.55*  --   --   --  1.96*  --  1.89*  --  1.85* 2.02*  CALCIUM 8.2*  --   --   --  8.8*  --  8.5*  --  8.6* 8.1*  MG  --  2.3 2.3 2.4 2.3 2.1  --  2.5*  --   --   PHOS  --  3.5  --  2.8 3.5 3.9  --  5.3*  --   --    Liver Function Tests: Recent Labs  Lab 04/29/18 0302 04/30/18 0423  AST 66* 52*  ALT 30 35  ALKPHOS 58 59  BILITOT 0.6 0.6  PROT 5.5* 5.9*  ALBUMIN 2.6* 2.7*   CBC: Recent Labs  Lab 04/29/18 0302 04/30/18 0423  05/02/18 1038 05/03/18 0444 05/04/18 0558 05/05/18 0435 05/05/18 0723  WBC 17.5* 9.7   < > 14.5* 10.6* 17.5* 11.0* 11.2*  NEUTROABS 15.3* 9.0*  --   --   --   --   --  9.1*  HGB 9.3* 8.5*   < > 9.9* 9.2* 8.9* 6.7* 6.7*  HCT 28.6* 26.3*   < > 30.5* 28.8* 28.0* 21.1* 20.8*  MCV 89.4 88.6   < > 89.4 91.1 91.2 91.7 91.6  PLT 146* 126*   < > 214 172 226 159 155   < > = values in this interval not displayed.   Cardiac Enzymes: No results for input(s): CKTOTAL, CKMB, CKMBINDEX, TROPONINI in the last 168 hours. BNP (last 3 results) Recent Labs    04/26/18 0030 04/27/18 1054 04/29/18 0302  BNP 717.0* 799.0* 889.0*    CBG: Recent Labs  Lab 05/04/18 1614 05/04/18 1926 05/04/18 2336 05/05/18 0324 05/05/18 0726  GLUCAP 148* 159* 153* 149* 140*    Recent Results (from the past 240 hour(s))  Blood culture (routine x 2)     Status: None   Collection Time: 04/26/18  1:54 AM  Result Value Ref Range Status   Specimen Description BLOOD LEFT FATTY CASTS  Final   Special Requests   Final    BOTTLES DRAWN AEROBIC AND ANAEROBIC Blood Culture adequate volume   Culture   Final    NO GROWTH 5 DAYS Performed at [facility], [address]  [place], [place] [address]    Report Status 05/01/2018 FINAL  Final  Blood culture (routine x 2)     Status: None   Collection Time: 04/26/18  1:55 AM  Result Value Ref Range Status   Specimen Description BLOOD LEFT ASSIST CONTROL  Final   Special Requests   Final    BOTTLES DRAWN AEROBIC AND ANAEROBIC Blood Culture results may not be optimal due to an excessive volume of blood received in culture bottles   Culture   Final    NO GROWTH 5 DAYS Performed at [facility], [address] [place], [place] [address]    Report Status 05/01/2018 FINAL  Final  MRSA PCR Screening     Status: None   Collection Time: 04/26/18  2:54 AM  Result Value Ref Range Status   MRSA by PCR NEGATIVE NEGATIVE Final    Comment:        The GeneXpert MRSA Assay (FDA approved for NASAL specimens only), is one component of a comprehensive MRSA colonization surveillance program. It is not intended to diagnose MRSA infection nor to guide or monitor treatment for MRSA infections. Performed at [facility], [address] [place], [place] [address]   Culture, respiratory (non-expectorated)     Status: None   Collection Time: 04/27/18 10:06 AM  Result Value Ref Range Status   Specimen Description   Final    TRACHEAL ASPIRATE Performed at [facility], [address], [place], [place] [address]    Special Requests   Final    NONE Performed at [facility], [address] [place], [place] [address]    Gram Stain   Final    FEW WBC PRESENT, PREDOMINANTLY PMN RARE GRAM POSITIVE RODS    Culture   Final    FEW Consistent with normal respiratory flora. Performed at [facility], [place] [address] [place], [place] [address]  Report Status 04/29/2018 FINAL  Final  CULTURE, BLOOD (ROUTINE X 2) w Reflex to ID Panel     Status: None   Collection Time: 04/28/18 11:52 AM  Result Value Ref Range Status   Specimen Description BLOOD LEFT HAND  Final   Special Requests    Final    BOTTLES DRAWN AEROBIC AND ANAEROBIC Blood Culture results may not be optimal due to an inadequate volume of blood received in culture bottles   Culture   Final    NO GROWTH 5 DAYS Performed at [facility], [address], [place], [place] [address]    Report Status 05/03/2018 FINAL  Final  Culture, blood (Routine X 2) w Reflex to ID Panel     Status: None   Collection Time: 04/28/18  1:47 PM  Result Value Ref Range Status   Specimen Description BLOOD BLOOD LEFT HAND  Final   Special Requests   Final    BOTTLES DRAWN AEROBIC AND ANAEROBIC Blood Culture adequate volume   Culture   Final    NO GROWTH 5 DAYS Performed at [facility], [address], [place], [place] [address]    Report Status 05/03/2018 FINAL  Final     Studies: Dg Chest Port 1 View  Result Date: 05/05/2018 CLINICAL DATA:  Pneumonia. EXAM: PORTABLE CHEST 1 VIEW COMPARISON:  Radiograph of May 01, 2018. FINDINGS: Stable cardiomediastinal silhouette. Endotracheal and nasogastric tubes are unchanged in position. Left-sided pacemaker is unchanged in position. No pneumothorax is noted. Mild bibasilar subsegmental atelectasis is noted with probable minimal pleural effusions. Bony thorax is unremarkable. IMPRESSION: Stable support apparatus. Mildly increased bibasilar subsegmental atelectasis with minimal pleural effusions. Electronically Signed   By: Marijo Conception, M.D.   On: 05/05/2018 07:20    Scheduled Meds: . aspirin  81 mg Per Tube Daily  . atorvastatin  40 mg Per Tube Daily  . budesonide (PULMICORT) nebulizer solution  0.5 mg Nebulization BID  . chlorhexidine gluconate (MEDLINE KIT)  15 mL Mouth Rinse BID  . clopidogrel  75 mg Per Tube Daily  . dexamethasone  4 mg Intravenous Q8H  . feeding supplement (VITAL HIGH PROTEIN)  1,000 mL Per Tube Q24H  . furosemide  40 mg Intravenous Q12H  . insulin aspart  0-9 Units Subcutaneous Q4H  . ipratropium-albuterol  3 mL Nebulization Q4H  .  lipase/protease/amylase  12,000 Units Oral Q8H  . mouth rinse  15 mL Mouth Rinse 10 times per day  . metoprolol tartrate  12.5 mg Oral BID  . oxyCODONE-acetaminophen  1 tablet Oral Q6H  . pantoprazole (PROTONIX) IV  40 mg Intravenous Daily  . QUEtiapine  100 mg Oral BID  . sodium chloride flush  10-40 mL Intracatheter Q12H   Continuous Infusions: . amiodarone 60 mg/hr (05/05/18 0900)  . ampicillin-sulbactam (UNASYN) IV 3 g (05/05/18 1008)  . dexmedetomidine (PRECEDEX) IV infusion 0.4 mcg/kg/hr (05/05/18 1008)  . heparin 900 Units/hr (05/05/18 0900)  . propofol (DIPRIVAN) infusion Stopped (05/04/18 1026)    Assessment/Plan:  1. Septic shock secondary to pneumonia.  Currently on Unasyn.  Off pressors currently. pending cultures. 2. Acute hypoxic respiratory failure with respiratory acidosis: off vent this am - has failed in past -On Precedex 3. Atrial fibrillation with rapid ventricular response.  Echocardiogram showed an EF of 40 to 45%.  Continue amiodarone and heparin drip.  in sinus rhythm this morning. 4. Acute metabolic encephalopathy.  CT scan negative.  Need continued monitoring on mental status. Intensivist is concerned about  possible steroid induced psychosis, taper as can 5. Elevated troponin secondary to demand ischemia with septic shock.  On aspirin, plavix 6. Acute kidney injury on chronic kidney disease stage III.  Creatinine 2 7. Pancreatic insufficiency on pancreatic enzymes 8. Anemia of chronic disease.  Hemoglobin today 9.1 9. Hyperlipidemia unspecified on Lipitor 10. History of prostate cancer 11. History of chronic combined systolic diastolic congestive heart failure. Lasix, metoprolol 12. Abdomina aortic aneurysm: outpt f/up with vascular surgery 13. Secondary hyperparathyroidism: mgmt per Nephro  Code Status: Full code    Code Status Orders  (From admission, onward)         Start     Ordered   04/26/18 0254  Full code  Continuous     04/26/18 0253         Code Status History    This patient has a current code status but no historical code status.     Family Communication: Spoke with family at bedside today Disposition Plan: To be determined  Consultants:  Critical care specialist  Cardiology  Nephrology  Antibiotics: -Unasyn  Time spent: 15 minutes.  Case discussed with nursing staff  [facility]

## 2018-05-05 NOTE — Progress Notes (Signed)
[facility]  ROUNDING NOTE   Subjective:   Family at bedside.  Extubated this morning.   Hemoglobin 6.7   UOP 2580  Creatinine 2.02 (1.85)  Objective:  Vital signs in last 24 hours:  Temp:  [98.1 F (36.7 C)-99.1 F (37.3 C)] 98.1 F (36.7 C) (11/19 0759) Pulse Rate:  [60-132] 66 (11/19 1000) Resp:  [9-20] 16 (11/19 1000) BP: (88-170)/(45-104) 92/46 (11/19 1000) SpO2:  [93 %-100 %] 95 % (11/19 1000) FiO2 (%):  [30 %] 30 % (11/19 0245) Weight:  [82.5 kg] 82.5 kg (11/19 0427)  Weight change:  Filed Weights   05/02/18 0500 05/03/18 0500 05/05/18 0427  Weight: 83.9 kg 83.9 kg 82.5 kg    Intake/Output: I/O last 3 completed shifts: In: 3957.4 [I.V.:1872.4; NG/GT:1685; IV Piggyback:399.9] Out: 4660 [Urine:4630; Stool:30]   Intake/Output this shift:  Total I/O In: 666.3 [I.V.:266.2; NG/GT:137.5; IV Piggyback:262.6] Out: -   Physical Exam: General: ill appearing  Head: /AT  Eyes: Anicteric  Neck: Supple, trachea midline  Lungs:  Basilar crackles  Heart: regular  Abdomen:  Soft, nontender, bowel sounds present  Extremities: no peripheral edema.  Neurologic: Isedated  Skin: No lesions       Basic Metabolic Panel: Recent Labs  Lab 05/01/18 0359 05/01/18 1313 05/01/18 1537 05/01/18 1719 05/02/18 0201 05/02/18 1655 05/03/18 0444 05/04/18 0558 05/04/18 0600 05/05/18 0435  NA 141  --   --   --  145  --  142  --  143 142  K 3.4*  --   --   --  3.9  --  3.8  --  4.3 4.1  CL 100  --   --   --  103  --  99  --  99 101  CO2 27  --   --   --  27  --  29  --  33* 32  GLUCOSE 213*  --   --   --  151*  --  210*  --  171* 163*  BUN 40*  --   --   --  52*  --  57*  --  70* 81*  CREATININE 1.55*  --   --   --  1.96*  --  1.89*  --  1.85* 2.02*  CALCIUM 8.2*  --   --   --  8.8*  --  8.5*  --  8.6* 8.1*  MG  --  2.3 2.3 2.4 2.3 2.1  --  2.5*  --   --   PHOS  --  3.5  --  2.8 3.5 3.9  --  5.3*  --   --     Liver Function Tests: Recent Labs  Lab  04/29/18 0302 04/30/18 0423  AST 66* 52*  ALT 30 35  ALKPHOS 58 59  BILITOT 0.6 0.6  PROT 5.5* 5.9*  ALBUMIN 2.6* 2.7*   No results for input(s): LIPASE, AMYLASE in the last 168 hours. Recent Labs  Lab 04/29/18 0302  AMMONIA 21    CBC: Recent Labs  Lab 04/29/18 0302 04/30/18 0423  05/02/18 1038 05/03/18 0444 05/04/18 0558 05/05/18 0435 05/05/18 0723  WBC 17.5* 9.7   < > 14.5* 10.6* 17.5* 11.0* 11.2*  NEUTROABS 15.3* 9.0*  --   --   --   --   --  9.1*  HGB 9.3* 8.5*   < > 9.9* 9.2* 8.9* 6.7* 6.7*  HCT 28.6* 26.3*   < > 30.5* 28.8* 28.0* 21.1* 20.8*  MCV 89.4 88.6   < >  89.4 91.1 91.2 91.7 91.6  PLT 146* 126*   < > 214 172 226 159 155   < > = values in this interval not displayed.    Cardiac Enzymes: No results for input(s): CKTOTAL, CKMB, CKMBINDEX, TROPONINI in the last 168 hours.  BNP: Invalid input(s): POCBNP  CBG: Recent Labs  Lab 05/04/18 1614 05/04/18 1926 05/04/18 2336 05/05/18 0324 05/05/18 0726  GLUCAP 148* 159* 153* 149* 140*    Microbiology: Results for orders placed or performed during the hospital encounter of 04/26/18  Blood culture (routine x 2)     Status: None   Collection Time: 04/26/18  1:54 AM  Result Value Ref Range Status   Specimen Description BLOOD LEFT FATTY CASTS  Final   Special Requests   Final    BOTTLES DRAWN AEROBIC AND ANAEROBIC Blood Culture adequate volume   Culture   Final    NO GROWTH 5 DAYS Performed at [facility], [address] [place], [place] [address]    Report Status 05/01/2018 FINAL  Final  Blood culture (routine x 2)     Status: None   Collection Time: 04/26/18  1:55 AM  Result Value Ref Range Status   Specimen Description BLOOD LEFT ASSIST CONTROL  Final   Special Requests   Final    BOTTLES DRAWN AEROBIC AND ANAEROBIC Blood Culture results may not be optimal due to an excessive volume of blood received in culture bottles   Culture   Final    NO GROWTH 5 DAYS Performed at [facility], [address] [place], [place] [address]    Report Status 05/01/2018 FINAL  Final  MRSA PCR Screening     Status: None   Collection Time: 04/26/18  2:54 AM  Result Value Ref Range Status   MRSA by PCR NEGATIVE NEGATIVE Final    Comment:        The GeneXpert MRSA Assay (FDA approved for NASAL specimens only), is one component of a comprehensive MRSA colonization surveillance program. It is not intended to diagnose MRSA infection nor to guide or monitor treatment for MRSA infections. Performed at [facility], [address] [place], [place] [address]   Culture, respiratory (non-expectorated)     Status: None   Collection Time: 04/27/18 10:06 AM  Result Value Ref Range Status   Specimen Description   Final    TRACHEAL ASPIRATE Performed at [facility], [address] [place], [place] [address]    Special Requests   Final    NONE Performed at [facility], [address] [place], [place] [address]    Gram Stain   Final    FEW WBC PRESENT, PREDOMINANTLY PMN RARE GRAM POSITIVE RODS    Culture   Final    FEW Consistent with normal respiratory flora. Performed at [facility], [place] [address], [place],  [address]    Report Status 04/29/2018 FINAL  Final  CULTURE, BLOOD (ROUTINE X 2) w Reflex to ID Panel     Status: None   Collection Time: 04/28/18 11:52 AM  Result Value Ref Range Status   Specimen Description BLOOD LEFT HAND  Final   Special Requests   Final    BOTTLES DRAWN AEROBIC AND ANAEROBIC Blood Culture results may not be optimal due to an inadequate volume of blood received in culture bottles   Culture   Final    NO GROWTH 5 DAYS Performed at [facility], [address],  [place], [place] [address]    Report Status 05/03/2018 FINAL  Final  Culture, blood (Routine X 2) w Reflex to ID Panel     Status: None   Collection Time: 04/28/18  1:47 PM  Result Value Ref Range Status   Specimen  Description BLOOD BLOOD LEFT HAND  Final   Special Requests   Final    BOTTLES DRAWN AEROBIC AND ANAEROBIC Blood Culture adequate volume   Culture   Final    NO GROWTH 5 DAYS Performed at [facility], [address], [place], [place] [address]    Report Status 05/03/2018 FINAL  Final    Coagulation Studies: No results for input(s): LABPROT, INR in the last 72 hours.  Urinalysis: No results for input(s): COLORURINE, LABSPEC, PHURINE, GLUCOSEU, HGBUR, BILIRUBINUR, KETONESUR, PROTEINUR, UROBILINOGEN, NITRITE, LEUKOCYTESUR in the last 72 hours.  Invalid input(s): APPERANCEUR    Imaging: Dg Chest Port 1 View  Result Date: 05/05/2018 CLINICAL DATA:  Pneumonia. EXAM: PORTABLE CHEST 1 VIEW COMPARISON:  Radiograph of May 01, 2018. FINDINGS: Stable cardiomediastinal silhouette. Endotracheal and nasogastric tubes are unchanged in position. Left-sided pacemaker is unchanged in position. No pneumothorax is noted. Mild bibasilar subsegmental atelectasis is noted with probable minimal pleural effusions. Bony thorax is unremarkable. IMPRESSION: Stable support apparatus. Mildly increased bibasilar subsegmental atelectasis with minimal pleural effusions. Electronically Signed   By: Marijo Conception, M.D.   On: 05/05/2018 07:20     Medications:   . amiodarone 60 mg/hr (05/05/18 0900)  . ampicillin-sulbactam (UNASYN) IV 3 g (05/05/18 1008)  . dexmedetomidine (PRECEDEX) IV infusion 0.4 mcg/kg/hr (05/05/18 1008)  . heparin 900 Units/hr (05/05/18 0900)  . propofol (DIPRIVAN) infusion Stopped (05/04/18 1026)   . aspirin  81 mg Per Tube Daily  . atorvastatin  40 mg Per Tube Daily  . budesonide (PULMICORT) nebulizer solution  0.5 mg Nebulization BID  . chlorhexidine gluconate (MEDLINE KIT)  15 mL Mouth Rinse BID  . clopidogrel  75 mg Per Tube Daily  . feeding supplement (VITAL HIGH PROTEIN)  1,000 mL Per Tube Q24H  . furosemide  40 mg Intravenous Q12H  . insulin aspart  0-9 Units  Subcutaneous Q4H  . ipratropium-albuterol  3 mL Nebulization Q4H  . lipase/protease/amylase  12,000 Units Oral Q8H  . mouth rinse  15 mL Mouth Rinse 10 times per day  . metoprolol tartrate  12.5 mg Oral BID  . oxyCODONE-acetaminophen  1 tablet Oral Q6H  . pantoprazole (PROTONIX) IV  40 mg Intravenous Daily  . QUEtiapine  100 mg Oral BID  . sodium chloride flush  10-40 mL Intracatheter Q12H   acetaminophen **OR** acetaminophen, bisacodyl, bisacodyl, fentaNYL (SUBLIMAZE) injection, fentaNYL (SUBLIMAZE) injection, haloperidol lactate, midazolam, ondansetron **OR** ondansetron (ZOFRAN) IV, sennosides, sodium chloride flush  Assessment/ Plan:  77 y.o. white male with a PMHx of abdominal aortic aneurysm, coronary artery disease, congestive heart failure, exocrine pancreatic insufficiency, chronic kidney disease stage III baseline creatinine 1.6, prostate cancer, hypertension   1.  Acute renal failure on chronic kidney disease stage III baseline creatinine 1.98, GFR of 32 in 08/08/2015. Creatinine improving to stable.  Nonoliguric urine output.   2.  Hypertension - furosemide IV - monitor contraction alkalosis  3.  Acute respiratory failure status post intubation x 2 on this admission. Extubated this morning.  - steroids - Unasyn.   4. Anemia with acute renal failure: hemoglobin 6.7 - recommend PRBC transfusion    LOS: 9 Matthew Zamora 11/19/201910:24 AM

## 2018-05-05 NOTE — Progress Notes (Signed)
Progress Note  Patient Name: Matthew Zamora Date of Encounter: 05/05/2018  Primary Cardiologist: [facility]  Subjective   Patient extubated this morning.  He is somnolent, remaining on Precedex, but denies chest pain, shortness of breath, and palpitations.  Inpatient Medications    Scheduled Meds: . aspirin  81 mg Per Tube Daily  . atorvastatin  40 mg Per Tube Daily  . budesonide (PULMICORT) nebulizer solution  0.5 mg Nebulization BID  . chlorhexidine gluconate (MEDLINE KIT)  15 mL Mouth Rinse BID  . clopidogrel  75 mg Per Tube Daily  . dexamethasone  4 mg Intravenous Q8H  . furosemide  40 mg Intravenous Q12H  . insulin aspart  0-9 Units Subcutaneous Q4H  . ipratropium-albuterol  3 mL Nebulization Q4H  . lipase/protease/amylase  12,000 Units Oral Q8H  . mouth rinse  15 mL Mouth Rinse 10 times per day  . metoprolol tartrate  12.5 mg Oral BID  . oxyCODONE-acetaminophen  1 tablet Oral Q6H  . pantoprazole (PROTONIX) IV  40 mg Intravenous Daily  . QUEtiapine  100 mg Oral BID  . sodium chloride flush  10-40 mL Intracatheter Q12H   Continuous Infusions: . amiodarone 60 mg/hr (05/05/18 1300)  . ampicillin-sulbactam (UNASYN) IV 5 mL/hr at 05/05/18 1300  . dexmedetomidine (PRECEDEX) IV infusion 0.5 mcg/kg/hr (05/05/18 1317)  . heparin 900 Units/hr (05/05/18 1300)   PRN Meds: acetaminophen **OR** acetaminophen, bisacodyl, bisacodyl, fentaNYL (SUBLIMAZE) injection, haloperidol lactate, midazolam, ondansetron **OR** ondansetron (ZOFRAN) IV, sennosides, sodium chloride flush   Vital Signs    Vitals:   05/05/18 1200 05/05/18 1208 05/05/18 1230 05/05/18 1300  BP: (!) 119/52  (!) 126/59 (!) 133/57  Pulse: 70  74 76  Resp: _0 Temp:  98.9 F (37.2 C)    TempSrc:  Axillary    SpO2: 97%  95% 95%  Weight:      Height:        Intake/Output Summary (Last 24 hours) at 05/05/2018 1355 Last data filed at 05/05/2018 1300 Gross per 24 hour  Intake 3484.43 ml  Output 2280 ml    Net 1204.43 ml   Filed Weights   05/02/18 0500 05/03/18 0500 05/05/18 0427  Weight: 83.9 kg 83.9 kg 82.5 kg    Telemetry    Paroxysmal atrial fibrillation.  He had a sustained event yesterday that ended in the evening.  He has remained in sinus rhythm with PACs thereafter. - Personally Reviewed  ECG    No new tracing. - Personally Reviewed  Physical Exam   GEN: No acute distress.   Neck:  JVP 8 to 10 cm. Cardiac:  Regular rate and rhythm with occasional extrasystoles.  1/6 systolic murmur.  No rubs or gallops. Respiratory:  Normal air movement.  Scattered wheezes.  No crackles. GI: Soft, nontender, non-distended  MS: No edema; No deformity. Neuro:  Nonfocal  Psych: Somnolent but answers simple questions and follows commands.  Labs    Chemistry Recent Labs  Lab 04/29/18 0302  04/30/18 0423  05/03/18 0444 05/04/18 0600 05/05/18 0435  NA 140   < > 143   < > 142 143 142  K 3.5   < > 3.7   < > 3.8 4.3 4.1  CL 104   < > 106   < > 99 99 101  CO2 26   < > 26   < > 29 33* 32  GLUCOSE 108*   < > 164*   < > 210* 171* 163*  BUN  33*   < > 33*   < > 57* 70* 81*  CREATININE 2.05*   < > 1.52*   < > 1.89* 1.85* 2.02*  CALCIUM 8.4*   < > 8.5*   < > 8.5* 8.6* 8.1*  PROT 5.5*  --  5.9*  --   --   --   --   ALBUMIN 2.6*  --  2.7*  --   --   --   --   AST 66*  --  52*  --   --   --   --   ALT 30  --  35  --   --   --   --   ALKPHOS 58  --  59  --   --   --   --   BILITOT 0.6  --  0.6  --   --   --   --   GFRNONAA 30*   < > 43*   < > 33* 34* 30*  GFRAA 35*   < > 50*   < > 38* 39* 35*  ANIONGAP 10   < > 11   < > _0 < > = values in this interval not displayed.     Hematology Recent Labs  Lab 05/04/18 0558 05/05/18 0435 05/05/18 0723  WBC 17.5* 11.0* 11.2*  RBC 3.07* 2.30* 2.27*  HGB 8.9* 6.7* 6.7*  HCT 28.0* 21.1* 20.8*  MCV 91.2 91.7 91.6  MCH 29.0 29.1 29.5  MCHC 31.8 31.8 32.2  RDW 15.8* 16.0* 15.9*  PLT 226 159 155    Cardiac EnzymesNo results for  input(s): TROPONINI in the last 168 hours. No results for input(s): TROPIPOC in the last 168 hours.   BNP Recent Labs  Lab 04/29/18 0302  BNP 889.0*     DDimer No results for input(s): DDIMER in the last 168 hours.   Radiology    Dg Chest Port 1 View  Result Date: 05/05/2018 CLINICAL DATA:  Pneumonia. EXAM: PORTABLE CHEST 1 VIEW COMPARISON:  Radiograph of May 01, 2018. FINDINGS: Stable cardiomediastinal silhouette. Endotracheal and nasogastric tubes are unchanged in position. Left-sided pacemaker is unchanged in position. No pneumothorax is noted. Mild bibasilar subsegmental atelectasis is noted with probable minimal pleural effusions. Bony thorax is unremarkable. IMPRESSION: Stable support apparatus. Mildly increased bibasilar subsegmental atelectasis with minimal pleural effusions. Electronically Signed   By: Marijo Conception, M.D.   On: 05/05/2018 07:20    Cardiac Studies   Echo 04/27/2018: Study Conclusions  - Left ventricle: The cavity size was normal. There was moderate focal basal hypertrophy of the septum. Systolic function was mildly to moderately reduced. The estimated ejection fraction was in the range of 40% to 45%. Images were inadequate for LV wall motion assessment. The study is not technically sufficient to allow evaluation of LV diastolic function. - Mitral valve: There was mild regurgitation. - Left atrium: The atrium was moderately dilated. - Right atrium: The atrium was mildly dilated. - Pulmonary arteries: Systolic pressure was moderately increased. PA peak pressure: 50 mm Hg (S). - Inferior vena cava: The vessel was dilated. The respirophasic diameter changes were blunted (<50%), consistent with elevated central venous pressure.  Patient Profile     77 y.o. male with history of coronary artery disease, chronic systolic heart failure, ventricular tachycardia status post ICD, and chronic kidney disease stage III,whowas admitted due  to acute respiratory failure thought to be due to pneumonia and developed intermittent episodes of atrial fibrillation  with rapid ventricular response.  Assessment & Plan    Acute respiratory failure with hypoxia and hypercapnia Patient extubated this morning, saturating well on room air.  Continue respiratory management per internal medicine and critical care medicine.  Continue diuresis with furosemide 40 mg IV twice daily.  Renal function remained stable.  Atrial fibrillation with rapid ventricular response Patient has converted to sinus rhythm.  Paroxysmal atrial fibrillation seems to be triggered by stress, particularly when agitated on the vent.  Hopefully, now that he is extubated, this will be less of an issue.  Wean steroids, as possible.  Continue metoprolol tartrate 12.5 mg twice daily.  Recommend switching amiodarone infusion to amiodarone 200 mg p.o. daily when he is able to reliably take oral medications.  In the setting of declining hemoglobin (6.7 from 8.9 today), I recommend stopping heparin.  Given severe anemia and dual antiplatelet therapy for PCI in 07/2017, long-term anticoagulation will be challenging.  Coronary artery disease No chest pain reported.  Troponin elevation on admission most likely represents supply-demand mismatch.  Continue supportive care.  No plans for ischemia evaluation right now, though this may need to be considered prior to discharge or as an outpatient at [facility].  Given worsening anemia, with a hemoglobin of 6.7 this morning, I recommend PRBC transfusion for goal hemoglobin greater than 8.  Though less than 12 months from the time of PCI to the RCA, I recommend discontinuation of clopidogrel and maintenance of aspirin 81 mg daily.  Acute on chronic systolic heart failure Ms. Labell still appears mildly volume overloaded.  He was slightly positive yesterday, albeit with considerable enteric tube and IV intake.  Will hold furosemide, given  slightly uptrending creatinine.  This will likely need to be restarted once renal function stabilizes.  Continue metoprolol tartrate 12.5 mg daily.  Acute on chronic kidney disease stage III Renal function trending up slightly, up to 2.0 today.  Nadir was 1.3 on 04/27/2018.  Avoid nephrotoxic drugs.  Hold additional furosemide today, with repeat BMP tomorrow.  However, if PRBC transfusion is given today, the patient may need some additional Lasix to prevent transfusion associated circulatory overload (TACO).  Anemia Chronic but progressive downtrending hemoglobin during this admission, noted to be 6.7 this morning (down from 8.9 yesterday.  Consider PRBC transfusion.  Discontinue clopidogrel.  Consider discontinuation of heparin if hemoglobin drops further or does not respond appropriately to PRBC transfusion.  For questions or updates, please contact [place] Please consult [email or web address] for contact info under [facility].    Signed, Nelva Bush, MD  05/05/2018, 1:55 PM

## 2018-05-05 NOTE — Progress Notes (Signed)
Pharmacy Antibiotic Note  Matthew Zamora is a 77 y.o. male admitted on 04/26/2018 with respiratory distress. Patient was admitted via EMS from home. Patient was intubated and chest x-ray is positive for pneumonia. WBC went down to WNL and patient remained afebrile on 11/14. Cultures have been negative so far. Pharmacy has been consulted for Unasyn dosing.  Plan: Continue Unasyn 3 g IV Q6h.   Height: 5\' 11"  (180.3 cm) Weight: 181 lb 14.1 oz (82.5 kg) IBW/kg (Calculated) : 75.3  Temp (24hrs), Avg:98.4 F (36.9 C), Min:98.1 F (36.7 C), Max:98.9 F (37.2 C)  Recent Labs  Lab 05/01/18 0359 05/02/18 0201 05/02/18 1038 05/03/18 0444 05/04/18 0558 05/04/18 0600 05/05/18 0435 05/05/18 0723  WBC 10.4  --  14.5* 10.6* 17.5*  --  11.0* 11.2*  CREATININE 1.55* 1.96*  --  1.89*  --  1.85* 2.02*  --     Estimated Creatinine Clearance: 33.1 mL/min (A) (by C-G formula based on SCr of 2.02 mg/dL (H)).    No Known Allergies  Antimicrobials this admission: Unasyn 11/12 >> Vancomycin 11/12 >> 11/14 Azithromycin 11/10 >> 11/12 Ceftriaxone 11/10 >> 11/12  Dose adjustments this admission: 11/13 Unasyn 3g Q12h >>> Unasyn 3g Q6h  Microbiology results: 11/10 BCx: negative  11/11 Sputum: normal flora 11/10 MRSA PCR: negative 11/12 BCx: NG x 3 days   Thank you for allowing pharmacy to be a part of this patient's care.  Pernell Dupre, PharmD, BCPS Clinical Pharmacist 05/05/2018 2:44 PM

## 2018-05-05 NOTE — Progress Notes (Addendum)
RN spoke with Dr. Soyla Murphy and made MD aware that occult stool was positive and MD acknowledged and stated he would order GI consult. RN also asked MD about giving blood over 1 hour and concern for CHF and Dr. Soyla Murphy stated "you can give blood over 3 hours."

## 2018-05-05 NOTE — Progress Notes (Signed)
Extubated to room air.  Alert to self.  No respiratory distress, hoarse voice with weak cough.  Son and daughter in law at bedside.

## 2018-05-05 NOTE — Progress Notes (Signed)
Cleaned patient up and blood noted in stool.  Called Dr. Soyla Murphy to come assess stool coming from flexi seal.  MD ordered occult stool sample.  Blood not noted in stool this morning in flexi seal.

## 2018-05-05 NOTE — Progress Notes (Signed)
Nutrition Follow-up  DOCUMENTATION CODES:   Not applicable  INTERVENTION:  Will monitor for diet advancement once patient is appropriate and recommend nutrition intervention at that time.  NUTRITION DIAGNOSIS:   Inadequate oral intake related to inability to eat as evidenced by NPO status.  Ongoing.  GOAL:   Patient will meet greater than or equal to 90% of their needs  Not progressing at this time.  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   -Patient s/p extubation on 11/13. -On 11/14 reintubated after questionable aspiration (given ice chips). -Patient s/p extubation this AM.  Patient was just extubated this AM. On room air. On Precedex gtt. Per chart patient is alert but only oriented to self. He is able to follow commands. Will monitor for diet advancement once patient appropriate.  Medications reviewed and include: Decadron 4 mg Q8hrs IV, Lasix 40 mg Q12hrs IV, Novolog 0-9 units Q4hrs, Creon 12000 units Q8hrs, pantoprazole, pantoprazole, Seroquel, amiodarone gtt, Unasyn, Precedex gtt, heparin gtt.  Labs reviewed: CBG 111-140.  Diet Order:   Diet Order            Diet NPO time specified  Diet effective now             EDUCATION NEEDS:   Not appropriate for education at this time  Skin:  Skin Assessment: Reviewed RN Assessment  Last BM:  05/05/2018 - small type 7  Height:   Ht Readings from Last 1 Encounters:  04/26/18 5\' 11"  (1.803 m)   Weight:   Wt Readings from Last 1 Encounters:  05/05/18 82.5 kg   Ideal Body Weight:  78.2  kg  BMI:  Body mass index is 25.37 kg/m.  Estimated Nutritional Needs:   Kcal:  1900-2220 (MSJ x 1.2-1.4)  Protein:  100-125 grams (1.2-1.5 grams/kg)  Fluid:  2 L/day (25 mL/kg)  Willey Blade, MS, RD, LDN Office: [phone] Pager: [phone] After Hours/Weekend Pager: [phone]

## 2018-05-05 NOTE — Progress Notes (Signed)
[place] for heparin dosing for this 73 YOM admitted on 11/10 with acute AMS/delirium and suspected pneumonia. Developed AFRVR on 11/11 requiring anticoagulation.  Indication: Anticoagulation-Afib   No Known Allergies  Patient Measurements: Height: 5\' 11"  (180.3 cm) Weight: 181 lb 14.1 oz (82.5 kg) IBW/kg (Calculated) : 75.3 Heparin Dosing Weight: 83.4 kg (actual BW)  Vital Signs: Temp: 98.3 F (36.8 C) (11/19 0400) Temp Source: Oral (11/19 0400) BP: 99/55 (11/19 0500) Pulse Rate: 60 (11/19 0500)  Labs: Recent Labs    05/03/18 0444 05/03/18 1403 05/03/18 2200 05/04/18 0558 05/04/18 0600 05/05/18 0435  HGB 9.2*  --   --  8.9*  --  6.7*  HCT 28.8*  --   --  28.0*  --  21.1*  PLT 172  --   --  226  --  159  APTT  --  78*  --   --   --   --   HEPARINUNFRC 0.86* 0.60 0.51 0.48  --  0.38  CREATININE 1.89*  --   --   --  1.85* 2.02*    Estimated Creatinine Clearance: 33.1 mL/min (A) (by C-G formula based on SCr of 2.02 mg/dL (H)).   Medical History: Past Medical History:  Diagnosis Date  . Abdominal aortic aneurysm (AAA) ([place]) 05/13/15   seen on ct scan  . 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 Anemia   . 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 CAD (coronary artery disease)   . Cataract cortical, senile   . CHF (congestive heart failure) ([place])   . Chronic hoarseness   . Exocrine pancreatic insufficiency   . H. pylori infection   . History of hepatitis   . Hyperlipidemia   . Hypertension   . 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 Prostate CA [facility])     Assessment: Patient will require anticoagulation for stroke prevention related to newly developed A-fib. CHA2DS2-VASc is at least 4 (age >11, HTN, CHF). Patient currently receiving heparin at 900 units/hr.   11/17 @  2233 HL  0.51. Level now therapeutic x 2 levels.  11/18 AM HL 0.58, CBC stable   Goal of Therapy:  Heparin level goal = 0.3-0.7  Plan:  11/19 @ 0500  HL 0.38 therapeutic. Will continue rate at 900 units/hr and will recheck HL w/ am labs. Hgb down to 6.7 mg/dL this am. Patient is not bleeding per RN. Will continue to monitor.  Tobie Lords, PharmD, BCPS Clinical Pharmacist 05/05/2018

## 2018-05-05 NOTE — Progress Notes (Signed)
Alert to person, napped briefly during shift.  NSR per cardiac monitor.  No respiratory distress since extubation.  o2 sats mid 90's on room air.

## 2018-05-05 NOTE — Progress Notes (Signed)
Dr. Soyla Murphy gave verbal order for fentanyl PRN 25 mcg IV push q4H for moderate pain.

## 2018-05-05 NOTE — Progress Notes (Signed)
Subjective: Patient much improved.  Family reports patient is near baseline.  Some confusion still noted.    Objective: Current vital signs: BP (!) 133/57   Pulse 76   Temp 98.9 F (37.2 C) (Axillary)   Resp 14   Ht '5\' 11"'  (1.803 m)   Wt 82.5 kg   SpO2 95%   BMI 25.37 kg/m  Vital signs in last 24 hours: Temp:  [98.1 F (36.7 C)-98.9 F (37.2 C)] 98.9 F (37.2 C) (11/19 1208) Pulse Rate:  [60-132] 76 (11/19 1300) Resp:  [9-19] 14 (11/19 1300) BP: (88-156)/(45-104) 133/57 (11/19 1300) SpO2:  [93 %-100 %] 95 % (11/19 1300) FiO2 (%):  [30 %] 30 % (11/19 0245) Weight:  [82.5 kg] 82.5 kg (11/19 0427)  Intake/Output from previous day: 11/18 0701 - 11/19 0700 In: 3024.6 [I.V.:1139.8; JO/IN:8676; IV Piggyback:199.9] Out: 2580 [Urine:2550; Stool:30] Intake/Output this shift: Total I/O In: 889.1 [I.V.:471.6; NG/GT:137.5; IV Piggyback:280.1] Out: 500 [Urine:500] Nutritional status:  Diet Order            Diet NPO time specified  Diet effective now              Neurologic Exam: Mental Status: Alert.  Oriented to name, place and age.  Reported that visitor in room was his girlfriend and she is actually his son's girlfriend.  Speech fluent without evidence of aphasia but soft and dysarthric.  Able to follow simple commands without difficulty. Cranial Nerves: II: Discs flat bilaterally; Blinks to bilateral confrontation. III,IV, VI: ptosis not present, extra-ocular motions intact bilaterally V,VII: smile symmetric, facial light touch sensation normal bilaterally VIII: hearing normal bilaterally IX,X: gag reflex present XI: bilateral shoulder shrug XII: midline tongue extension Motor: Moves all extremities against gravity.    Lab Results: Basic Metabolic Panel: Recent Labs  Lab 05/01/18 0359 05/01/18 1313 05/01/18 1537 05/01/18 1719 05/02/18 0201 05/02/18 1655 05/03/18 0444 05/04/18 0558 05/04/18 0600 05/05/18 0435  NA 141  --   --   --  145  --  142  --  143  142  K 3.4*  --   --   --  3.9  --  3.8  --  4.3 4.1  CL 100  --   --   --  103  --  99  --  99 101  CO2 27  --   --   --  27  --  29  --  33* 32  GLUCOSE 213*  --   --   --  151*  --  210*  --  171* 163*  BUN 40*  --   --   --  52*  --  57*  --  70* 81*  CREATININE 1.55*  --   --   --  1.96*  --  1.89*  --  1.85* 2.02*  CALCIUM 8.2*  --   --   --  8.8*  --  8.5*  --  8.6* 8.1*  MG  --  2.3 2.3 2.4 2.3 2.1  --  2.5*  --   --   PHOS  --  3.5  --  2.8 3.5 3.9  --  5.3*  --   --     Liver Function Tests: Recent Labs  Lab 04/29/18 0302 04/30/18 0423  AST 66* 52*  ALT 30 35  ALKPHOS 58 59  BILITOT 0.6 0.6  PROT 5.5* 5.9*  ALBUMIN 2.6* 2.7*   No results for input(s): LIPASE, AMYLASE in the last 168 hours. Recent  Labs  Lab 04/29/18 0302  AMMONIA 21    CBC: Recent Labs  Lab 04/29/18 0302 04/30/18 0423  05/02/18 1038 05/03/18 0444 05/04/18 0558 05/05/18 0435 05/05/18 0723  WBC 17.5* 9.7   < > 14.5* 10.6* 17.5* 11.0* 11.2*  NEUTROABS 15.3* 9.0*  --   --   --   --   --  9.1*  HGB 9.3* 8.5*   < > 9.9* 9.2* 8.9* 6.7* 6.7*  HCT 28.6* 26.3*   < > 30.5* 28.8* 28.0* 21.1* 20.8*  MCV 89.4 88.6   < > 89.4 91.1 91.2 91.7 91.6  PLT 146* 126*   < > 214 172 226 159 155   < > = values in this interval not displayed.    Cardiac Enzymes: No results for input(s): CKTOTAL, CKMB, CKMBINDEX, TROPONINI in the last 168 hours.  Lipid Panel: Recent Labs  Lab 04/30/18 2041  TRIG 130    CBG: Recent Labs  Lab 05/04/18 1926 05/04/18 2336 05/05/18 0324 05/05/18 0726 05/05/18 1112  GLUCAP 159* 153* 149* 140* 111*    Microbiology: Results for orders placed or performed during the hospital encounter of 04/26/18  Blood culture (routine x 2)     Status: None   Collection Time: 04/26/18  1:54 AM  Result Value Ref Range Status   Specimen Description BLOOD LEFT FATTY CASTS  Final   Special Requests   Final    BOTTLES DRAWN AEROBIC AND ANAEROBIC Blood Culture adequate volume   Culture    Final    NO GROWTH 5 DAYS Performed at [facility], [address] [place], [place] [address]    Report Status 05/01/2018 FINAL  Final  Blood culture (routine x 2)     Status: None   Collection Time: 04/26/18  1:55 AM  Result Value Ref Range Status   Specimen Description BLOOD LEFT ASSIST CONTROL  Final   Special Requests   Final    BOTTLES DRAWN AEROBIC AND ANAEROBIC Blood Culture results may not be optimal due to an excessive volume of blood received in culture bottles   Culture   Final    NO GROWTH 5 DAYS Performed at [facility], [address] [place], [place] [address]    Report Status 05/01/2018 FINAL  Final  MRSA PCR Screening     Status: None   Collection Time: 04/26/18  2:54 AM  Result Value Ref Range Status   MRSA by PCR NEGATIVE NEGATIVE Final    Comment:        The GeneXpert MRSA Assay (FDA approved for NASAL specimens only), is one component of a comprehensive MRSA colonization surveillance program. It is not intended to diagnose MRSA infection nor to guide or monitor treatment for MRSA infections. Performed at [facility], [address] [place], [place] [address]   Culture, respiratory (non-expectorated)     Status: None   Collection Time: 04/27/18 10:06 AM  Result Value Ref Range Status   Specimen Description   Final    TRACHEAL ASPIRATE Performed at [facility], [address], [place],  [address]    Special Requests   Final    NONE Performed at [facility], [address] [place],  [address]    Gram Stain   Final    FEW WBC PRESENT, PREDOMINANTLY PMN RARE GRAM POSITIVE RODS    Culture   Final    FEW Consistent with normal respiratory flora. Performed at [facility], [place]  [address] [place], [place] [address]    Report Status 04/29/2018 FINAL  Final  CULTURE, BLOOD (ROUTINE X 2) w Reflex to ID Panel     Status: None   Collection Time: 04/28/18 11:52 AM  Result Value  Ref Range Status   Specimen Description BLOOD LEFT HAND  Final   Special Requests   Final    BOTTLES DRAWN AEROBIC AND ANAEROBIC Blood Culture results may not be optimal due to an inadequate volume of blood received in culture bottles   Culture   Final    NO GROWTH 5 DAYS Performed at [facility], [address], [place], [place] [address]    Report Status 05/03/2018 FINAL  Final  Culture, blood (Routine X 2) w Reflex to ID Panel     Status: None   Collection Time: 04/28/18  1:47 PM  Result Value Ref Range Status   Specimen Description BLOOD BLOOD LEFT HAND  Final   Special Requests   Final    BOTTLES DRAWN AEROBIC AND ANAEROBIC Blood Culture adequate volume   Culture   Final    NO GROWTH 5 DAYS Performed at [facility], [address], [place], [place] [address]    Report Status 05/03/2018 FINAL  Final    Coagulation Studies: No results for input(s): LABPROT, INR in the last 72 hours.  Imaging: Dg Chest Port 1 View  Result Date: 05/05/2018 CLINICAL DATA:  Pneumonia. EXAM: PORTABLE CHEST 1 VIEW COMPARISON:  Radiograph of May 01, 2018. FINDINGS: Stable cardiomediastinal silhouette. Endotracheal and nasogastric tubes are unchanged in position. Left-sided pacemaker is unchanged in position. No pneumothorax is noted. Mild bibasilar subsegmental atelectasis is noted with probable minimal pleural effusions. Bony thorax is unremarkable. IMPRESSION: Stable support apparatus. Mildly increased bibasilar subsegmental atelectasis with minimal pleural effusions. Electronically Signed   By: Marijo Conception, M.D.   On: 05/05/2018 07:20    Medications:  I have reviewed the patient's current medications. Scheduled: . aspirin  81 mg Per Tube Daily  . atorvastatin  40 mg Per Tube Daily  . budesonide (PULMICORT) nebulizer solution  0.5 mg Nebulization BID  . chlorhexidine gluconate (MEDLINE KIT)  15 mL Mouth Rinse BID  . dexamethasone  4 mg Intravenous Q8H  . insulin  aspart  0-9 Units Subcutaneous Q4H  . ipratropium-albuterol  3 mL Nebulization Q4H  . lipase/protease/amylase  12,000 Units Oral Q8H  . mouth rinse  15 mL Mouth Rinse 10 times per day  . metoprolol tartrate  12.5 mg Oral BID  . oxyCODONE-acetaminophen  1 tablet Oral Q6H  . pantoprazole (PROTONIX) IV  40 mg Intravenous Daily  . QUEtiapine  100 mg Oral BID  . sodium chloride flush  10-40 mL Intracatheter Q12H    Assessment/Plan: Patient improving cognitively although not completely back to baseline.  Imaging and EEG are unremarkable.  Mental status likely related to infection and possible underlying dementia with hospitalization.  No further imaging recommended at this time.    Will follow prn   LOS: 9 days   Alexis Goodell, MD Neurology [phone] 05/05/2018  2:29 PM

## 2018-05-05 NOTE — Progress Notes (Addendum)
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  has a past surgical history that includes Prostate surgery; Insertion of ICD; Cataract extraction; Tonsillectomy; Colonoscopy (10/06/2014, 09/18/2004, 03/14/2009); Flexible sigmoidoscopy (08/26/1990); and Esophagogastroduodenoscopy (10/06/2014, 03/18/2001, 03/14/2009). Prior to Admission medications   Medication Sig Start Date End Date Taking? Authorizing Provider  aspirin EC 81 MG tablet Take 81 mg by mouth daily.   Yes [provider]  atorvastatin (LIPITOR) 40 MG tablet Take 40 mg by mouth daily.   Yes [provider]  clopidogrel (PLAVIX) 75 MG tablet Take 75 mg by mouth daily. 04/15/18  Yes [provider]  furosemide (LASIX) 40 MG tablet Take 20 mg by mouth daily.   Yes [provider]  lisinopril (PRINIVIL,ZESTRIL) 5 MG tablet Take 5 mg by mouth daily.   Yes [provider]  magnesium (MAGTAB) 84 MG (7MEQ) TBCR SR tablet Take 84 mg by mouth daily.   Yes [provider]  Melatonin 1 MG TABS Take 1 tablet by mouth at bedtime as needed. 12/27/13  Yes [provider]  metoprolol succinate (TOPROL-XL) 100 MG 24 hr tablet Take 100 mg by mouth daily. Take with or immediately following a meal.   Yes [provider]  Multiple Vitamins-Minerals (MULTIVITAMIN WITH MINERALS)  tablet Take 1 tablet by mouth daily.   Yes [provider]  omeprazole (PRILOSEC) 20 MG capsule Take 20 mg by mouth daily.   Yes [provider]  oxybutynin (DITROPAN XL) 15 MG 24 hr tablet Take 15 mg by mouth at bedtime.   Yes [provider]  lipase/protease/amylase (CREON) 12000 units CPEP capsule Take 24,000 Units by mouth 2 (two) times daily at 10 am and 4 pm.    [provider]   No Known Allergies  FAMILY HISTORY:  family history includes Heart attack in his father and mother. SOCIAL HISTORY:  reports that he quit smoking about 18 years ago. His smoking use included cigarettes. He has a 38.00 pack-year smoking history. He has never used smokeless tobacco. He reports that he does not drink alcohol.  REVIEW OF SYSTEMS:   Unable to obtain due to critical illness   VITAL SIGNS: Temp:  [98.2 F (36.8 C)-99.1 F (37.3 C)] 98.3 F (36.8 C) (11/19 0400) Pulse Rate:  [60-132] 60 (11/19 0500) Resp:  [9-20] 9 (11/19 0500) BP: (88-170)/(48-104) 99/55 (11/19 0500) SpO2:  [98 %-100 %] 100 % (11/19 0500) FiO2 (%):  [30 %] 30 % (11/19 0245) Weight:  [82.5 kg] 82.5 kg (11/19 0427)  Physical Examination:  RASS of -2 and following simple commands On vent, no distress, BEAE and no rales S1 & S2 audible and no murmur Benign abdomen with feeble peristalses No edema   ASSESSMENT / PLAN:  Acute respiratory failure s/p failed extubation and had to be reintubated because of agitation. Family at the bedside confirmed history of panic attacks and anxiety. -On Precedex, tolerating SBT -Assess for weaning   Altered mental status (agitation) with possible steroid induced psychosis / metabolic encephalopathy. CT  head -ve for acute intracranial abnormality. -Taper off steroids -Monitor neuro status.  Pneumonia. bibasilar airspace disease with pulmonary congestion -Empiric Unasyn. S/p Vanc + Zithro + Zosyn -Diuresis to improve lung compliance -Monitor  CXR + CBC + FIO2  Septic shock. Off pressers -Monitor hemodynamics  A fib with RVR, elevated Trop with demand vs. Supply mismatch. 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 -management as per renal  HTN.  -Optimize antihypertensives and monitor hemodynamics  Pancreatic insufficiency -Pancreatic enzymes supplements  Anemia. HB dropped 2 gm requiring transfusion -Keep HB > 7 gm/dl. -D/C Heparin for A fib  Full code  DV & GI prophylaxis. Continue with supportive care  Critical care time 40 min

## 2018-05-05 NOTE — Progress Notes (Signed)
Extubated without complications to room air

## 2018-05-06 ENCOUNTER — Inpatient Hospital Stay: Payer: Medicare Other

## 2018-05-06 DIAGNOSIS — N170 Acute kidney failure with tubular necrosis: Secondary | ICD-10-CM

## 2018-05-06 LAB — BPAM RBC
Blood Product Expiration Date: 201911282359
ISSUE DATE / TIME: 201911191649
Unit Type and Rh: 600

## 2018-05-06 LAB — COMPREHENSIVE METABOLIC PANEL
ALT: 88 U/L — AB (ref 0–44)
AST: 151 U/L — AB (ref 15–41)
Albumin: 2.5 g/dL — ABNORMAL LOW (ref 3.5–5.0)
Alkaline Phosphatase: 44 U/L (ref 38–126)
Anion gap: 10 (ref 5–15)
BILIRUBIN TOTAL: 0.8 mg/dL (ref 0.3–1.2)
BUN: 65 mg/dL — AB (ref 8–23)
CHLORIDE: 106 mmol/L (ref 98–111)
CO2: 29 mmol/L (ref 22–32)
CREATININE: 1.77 mg/dL — AB (ref 0.61–1.24)
Calcium: 8.3 mg/dL — ABNORMAL LOW (ref 8.9–10.3)
GFR, EST AFRICAN AMERICAN: 41 mL/min — AB (ref >60)
GFR, EST NON AFRICAN AMERICAN: 36 mL/min — AB (ref >60)
Glucose, Bld: 134 mg/dL — ABNORMAL HIGH (ref 70–99)
Potassium: 4.8 mmol/L (ref 3.5–5.1)
Sodium: 145 mmol/L (ref 135–145)
TOTAL PROTEIN: 5.4 g/dL — AB (ref 6.5–8.1)

## 2018-05-06 LAB — CBC WITH DIFFERENTIAL/PLATELET
Abs Immature Granulocytes: 0.21 10*3/uL — ABNORMAL HIGH (ref 0.00–0.07)
Basophils Absolute: 0 10*3/uL (ref 0.0–0.1)
Basophils Relative: 0 %
Eosinophils Absolute: 0 10*3/uL (ref 0.0–0.5)
Eosinophils Relative: 0 %
HEMATOCRIT: 25.1 % — AB (ref 39.0–52.0)
Hemoglobin: 8.1 g/dL — ABNORMAL LOW (ref 13.0–17.0)
IMMATURE GRANULOCYTES: 2 %
LYMPHS ABS: 0.5 10*3/uL — AB (ref 0.7–4.0)
Lymphocytes Relative: 4 %
MCH: 29.1 pg (ref 26.0–34.0)
MCHC: 32.3 g/dL (ref 30.0–36.0)
MCV: 90.3 fL (ref 80.0–100.0)
MONOS PCT: 6 %
Monocytes Absolute: 0.7 10*3/uL (ref 0.1–1.0)
NEUTROS ABS: 11.7 10*3/uL — AB (ref 1.7–7.7)
NEUTROS PCT: 88 %
PLATELETS: 164 10*3/uL (ref 150–400)
RBC: 2.78 MIL/uL — ABNORMAL LOW (ref 4.22–5.81)
RDW: 16.3 % — ABNORMAL HIGH (ref 11.5–15.5)
WBC: 13.2 10*3/uL — ABNORMAL HIGH (ref 4.0–10.5)
nRBC: 0 % (ref 0.0–0.2)

## 2018-05-06 LAB — TYPE AND SCREEN
ABO/RH(D): AB NEG
Antibody Screen: NEGATIVE
UNIT DIVISION: 0

## 2018-05-06 LAB — HEMOGLOBIN AND HEMATOCRIT, BLOOD
HCT: 25.6 % — ABNORMAL LOW (ref 39.0–52.0)
HCT: 25.7 % — ABNORMAL LOW (ref 39.0–52.0)
HEMOGLOBIN: 8.3 g/dL — AB (ref 13.0–17.0)
Hemoglobin: 8.4 g/dL — ABNORMAL LOW (ref 13.0–17.0)

## 2018-05-06 LAB — GLUCOSE, CAPILLARY
GLUCOSE-CAPILLARY: 120 mg/dL — AB (ref 70–99)
GLUCOSE-CAPILLARY: 108 mg/dL — AB (ref 70–99)
Glucose-Capillary: 112 mg/dL — ABNORMAL HIGH (ref 70–99)
Glucose-Capillary: 120 mg/dL — ABNORMAL HIGH (ref 70–99)
Glucose-Capillary: 129 mg/dL — ABNORMAL HIGH (ref 70–99)

## 2018-05-06 LAB — MAGNESIUM: MAGNESIUM: 2.6 mg/dL — AB (ref 1.7–2.4)

## 2018-05-06 LAB — PHOSPHORUS: PHOSPHORUS: 4.7 mg/dL — AB (ref 2.5–4.6)

## 2018-05-06 MED ORDER — SENNOSIDES-DOCUSATE SODIUM 8.6-50 MG PO TABS: 1.0000 | ORAL_TABLET | Freq: Two times a day (BID) | ORAL | Status: DC | PRN

## 2018-05-06 MED ORDER — ACETAMINOPHEN 650 MG RE SUPP: 650.0000 mg | Freq: Four times a day (QID) | RECTAL | Status: DC | PRN

## 2018-05-06 MED ORDER — ENSURE ENLIVE PO LIQD
237.0000 mL | Freq: Two times a day (BID) | ORAL | Status: DC
  Administered 2018-05-06 – 2018-05-13 (×6): 237 mL via ORAL

## 2018-05-06 MED ORDER — INSULIN ASPART 100 UNIT/ML ~~LOC~~ SOLN
0.0000 [IU] | Freq: Three times a day (TID) | SUBCUTANEOUS | Status: DC
  Administered 2018-05-09: 1 [IU] via SUBCUTANEOUS
  Filled 2018-05-06: qty 1

## 2018-05-06 MED ORDER — METOPROLOL TARTRATE 25 MG PO TABS
25.0000 mg | ORAL_TABLET | Freq: Two times a day (BID) | ORAL | Status: DC
  Administered 2018-05-06 – 2018-05-07 (×2): 25 mg via ORAL
  Filled 2018-05-06 (×2): qty 1

## 2018-05-06 MED ORDER — IPRATROPIUM-ALBUTEROL 0.5-2.5 (3) MG/3ML IN SOLN
3.0000 mL | Freq: Two times a day (BID) | RESPIRATORY_TRACT | Status: DC
  Administered 2018-05-06 – 2018-05-13 (×14): 3 mL via RESPIRATORY_TRACT
  Filled 2018-05-06 (×15): qty 3

## 2018-05-06 MED ORDER — ATORVASTATIN CALCIUM 20 MG PO TABS
40.0000 mg | ORAL_TABLET | Freq: Every day | ORAL | Status: DC
  Administered 2018-05-06 – 2018-05-12 (×7): 40 mg via ORAL
  Filled 2018-05-06 (×7): qty 2

## 2018-05-06 MED ORDER — ADULT MULTIVITAMIN W/MINERALS CH
1.0000 | ORAL_TABLET | Freq: Every day | ORAL | Status: DC
  Administered 2018-05-06 – 2018-05-13 (×7): 1 via ORAL
  Filled 2018-05-06 (×9): qty 1

## 2018-05-06 MED ORDER — ASPIRIN 81 MG PO CHEW
81.0000 mg | CHEWABLE_TABLET | Freq: Every day | ORAL | Status: DC
  Administered 2018-05-07 – 2018-05-13 (×7): 81 mg via ORAL
  Filled 2018-05-06 (×8): qty 1

## 2018-05-06 MED ORDER — ORAL CARE MOUTH RINSE
15.0000 mL | Freq: Two times a day (BID) | OROMUCOSAL | Status: DC
  Administered 2018-05-06 – 2018-05-13 (×8): 15 mL via OROMUCOSAL

## 2018-05-06 MED ORDER — METOPROLOL TARTRATE 25 MG PO TABS
12.5000 mg | ORAL_TABLET | Freq: Once | ORAL | Status: AC
  Administered 2018-05-06: 12.5 mg via ORAL
  Filled 2018-05-06: qty 1

## 2018-05-06 MED ORDER — ACETAMINOPHEN 325 MG PO TABS
650.0000 mg | ORAL_TABLET | Freq: Four times a day (QID) | ORAL | Status: DC | PRN
  Administered 2018-05-10 – 2018-05-12 (×2): 650 mg via ORAL
  Filled 2018-05-06 (×2): qty 2

## 2018-05-06 MED ORDER — PANCRELIPASE (LIP-PROT-AMYL) 12000-38000 UNITS PO CPEP
12000.0000 [IU] | ORAL_CAPSULE | Freq: Three times a day (TID) | ORAL | Status: DC
  Administered 2018-05-07 – 2018-05-13 (×15): 12000 [IU] via ORAL
  Filled 2018-05-06 (×21): qty 1

## 2018-05-06 MED ORDER — CLONAZEPAM 0.125 MG PO TBDP
0.2500 mg | ORAL_TABLET | Freq: Two times a day (BID) | ORAL | Status: DC
  Administered 2018-05-06 – 2018-05-13 (×13): 0.25 mg via ORAL
  Filled 2018-05-06 (×15): qty 2

## 2018-05-06 NOTE — Progress Notes (Signed)
Progress Note  Patient Name: Matthew Zamora Date of Encounter: 05/06/2018  Primary Cardiologist: [facility]  Subjective   Extubated, reports that he feels well, would like to get moving out of bed Eating ice chips Denies significant shortness of breath, discomfort Telemetry reviewed, significant baseline artifact, concerning for atrial fibrillation starting this morning Was normal sinus rhythm overnight Still on amiodarone infusion 60 mg an hour Heart rates 80-90   Inpatient Medications    Scheduled Meds: . [START ON 05/07/2018] aspirin  81 mg Oral Daily  . atorvastatin  40 mg Oral Daily  . budesonide (PULMICORT) nebulizer solution  0.5 mg Nebulization BID  . clonazepam  0.25 mg Oral BID  . feeding supplement (ENSURE ENLIVE)  237 mL Oral BID BM  . insulin aspart  0-9 Units Subcutaneous Q4H  . ipratropium-albuterol  3 mL Nebulization BID  . lipase/protease/amylase  12,000 Units Oral Q8H  . mouth rinse  15 mL Mouth Rinse BID  . metoprolol tartrate  25 mg Oral BID  . multivitamin with minerals  1 tablet Oral Daily  . oxyCODONE-acetaminophen  1 tablet Oral Q6H  . pantoprazole (PROTONIX) IV  40 mg Intravenous Q12H  . QUEtiapine  100 mg Oral BID  . sodium chloride flush  10-40 mL Intracatheter Q12H   Continuous Infusions: . amiodarone 60 mg/hr (05/06/18 1507)   PRN Meds: acetaminophen **OR** acetaminophen, bisacodyl, bisacodyl, fentaNYL (SUBLIMAZE) injection, ondansetron **OR** ondansetron (ZOFRAN) IV, senna-docusate, sodium chloride flush   Vital Signs    Vitals:   05/06/18 1100 05/06/18 1200 05/06/18 1300 05/06/18 1327  BP: (!) 151/76 139/61 (!) 161/87 (!) 162/74  Pulse: 78 85 83 98  Resp: 15 16 (!) 25 18  Temp:  98.1 F (36.7 C)    TempSrc:  Oral    SpO2: 96% 97% 96% 98%  Weight:      Height:        Intake/Output Summary (Last 24 hours) at 05/06/2018 1637 Last data filed at 05/06/2018 1300 Gross per 24 hour  Intake 1053.17 ml  Output 1800 ml  Net -746.83  ml   Filed Weights   05/03/18 0500 05/05/18 0427 05/06/18 0500  Weight: 83.9 kg 82.5 kg 81.2 kg    Telemetry    Normal sinus rhythm, possibly changing to atrial fibrillation early this morning, rate 80s to 90s  ECG    No new tracing. - Personally Reviewed  Physical Exam   Constitutional: No distress, mild confusion HENT:  Head: Grossly normal Eyes:  no discharge. No scleral icterus.  Neck: No JVD, no carotid bruits  Cardiovascular: Irregularly irregular no murmurs appreciated Pulmonary/Chest: Mild diffuse bronchial breath sounds, Rales Abdominal: Soft.  no distension.  no tenderness.  Musculoskeletal: Normal range of motion Neurological:  normal muscle tone. Coordination normal. No atrophy Skin: Skin warm and dry Psychiatric: normal affect, pleasant, does not appear anxious   Labs    Chemistry Recent Labs  Lab 04/30/18 0423  05/04/18 0600 05/05/18 0435 05/06/18 0534  NA 143   < > 143 142 145  K 3.7   < > 4.3 4.1 4.8  CL 106   < > 99 101 106  CO2 26   < > 33* 32 29  GLUCOSE 164*   < > 171* 163* 134*  BUN 33*   < > 70* 81* 65*  CREATININE 1.52*   < > 1.85* 2.02* 1.77*  CALCIUM 8.5*   < > 8.6* 8.1* 8.3*  PROT 5.9*  --   --   --  5.4*  ALBUMIN 2.7*  --   --   --  2.5*  AST 52*  --   --   --  151*  ALT 35  --   --   --  88*  ALKPHOS 59  --   --   --  44  BILITOT 0.6  --   --   --  0.8  GFRNONAA 43*   < > 34* 30* 36*  GFRAA 50*   < > 39* 35* 41*  ANIONGAP 11   < > 11 9 10    < > = values in this interval not displayed.     Hematology Recent Labs  Lab 05/05/18 0435 05/05/18 0723 05/05/18 2047 05/06/18 0534 05/06/18 1546  WBC 11.0* 11.2*  --  13.2*  --   RBC 2.30* 2.27*  --  2.78*  --   HGB 6.7* 6.7* 9.0* 8.1* 8.4*  HCT 21.1* 20.8* 27.7* 25.1* 25.6*  MCV 91.7 91.6  --  90.3  --   MCH 29.1 29.5  --  29.1  --   MCHC 31.8 32.2  --  32.3  --   RDW 16.0* 15.9*  --  16.3*  --   PLT 159 155  --  164  --     Cardiac EnzymesNo results for input(s):  TROPONINI in the last 168 hours. No results for input(s): TROPIPOC in the last 168 hours.   BNP No results for input(s): BNP, PROBNP in the last 168 hours.   DDimer No results for input(s): DDIMER in the last 168 hours.   Radiology    Dg Chest Port 1 View  Result Date: 05/06/2018 CLINICAL DATA:  Pneumonia. EXAM: PORTABLE CHEST 1 VIEW COMPARISON:  Radiograph of May 05, 2018. FINDINGS: Stable cardiomediastinal silhouette. Left-sided pacemaker is unchanged in position. Right-sided PICC line is unchanged in position. Endotracheal and nasogastric tubes have been removed. No pneumothorax is noted. Mild bibasilar subsegmental atelectasis is noted with possible small left pleural effusion. Bony thorax is unremarkable. IMPRESSION: Endotracheal and nasogastric tubes have been removed. Mild bibasilar subsegmental atelectasis is noted with possible small left pleural effusion. Electronically Signed   By: Marijo Conception, M.D.   On: 05/06/2018 07:22   Dg Chest Port 1 View  Result Date: 05/05/2018 CLINICAL DATA:  Pneumonia. EXAM: PORTABLE CHEST 1 VIEW COMPARISON:  Radiograph of May 01, 2018. FINDINGS: Stable cardiomediastinal silhouette. Endotracheal and nasogastric tubes are unchanged in position. Left-sided pacemaker is unchanged in position. No pneumothorax is noted. Mild bibasilar subsegmental atelectasis is noted with probable minimal pleural effusions. Bony thorax is unremarkable. IMPRESSION: Stable support apparatus. Mildly increased bibasilar subsegmental atelectasis with minimal pleural effusions. Electronically Signed   By: Marijo Conception, M.D.   On: 05/05/2018 07:20    Cardiac Studies   Echo 04/27/2018: Study Conclusions  - Left ventricle: The cavity size was normal. There was moderate focal basal hypertrophy of the septum. Systolic function was mildly to moderately reduced. The estimated ejection fraction was in the range of 40% to 45%. Images were inadequate for LV  wall motion assessment. The study is not technically sufficient to allow evaluation of LV diastolic function. - Mitral valve: There was mild regurgitation. - Left atrium: The atrium was moderately dilated. - Right atrium: The atrium was mildly dilated. - Pulmonary arteries: Systolic pressure was moderately increased. PA peak pressure: 50 mm Hg (S). - Inferior vena cava: The vessel was dilated. The respirophasic diameter changes were blunted (<50%), consistent with elevated central venous pressure.  Patient Profile     77 y.o. male with history of coronary artery disease, chronic systolic heart failure, ventricular tachycardia status post ICD, and chronic kidney disease stage III,whowas admitted due to acute respiratory failure thought to be due to pneumonia and developed intermittent episodes of atrial fibrillation with rapid ventricular response.  Assessment & Plan   Acute respiratory failure with hypoxia and hypercapnia Extubated, stable Not eating very much, Lasix has been held Elevated BUN/creatinine, Appears relatively euvolemic  Atrial fibrillation with rapid ventricular response  Paroxysmal atrial fibrillation  Would continue amiodarone infusion, will increase metoprolol up to 25 twice daily Not a good candidate for anticoagulation given severe anemia  Coronary artery disease Demand ischemia, no plan for ischemic work-up at this time On low-dose aspirin  Acute on chronic systolic heart failure Lasix on hold Ejection fraction 40 to 45%, was obtained when he was normal sinus rhythm April 26, 2018 Consider repeat echocardiogram at a later date in recovery  Acute on chronic kidney disease stage III Well above his baseline, Lasix held  Anemia Received transfusion, hemoglobin 8.4 down from 9.0  The above was discussed with family   Total encounter time more than 25 minutes  Greater than 50% was spent in counseling and coordination of care with the  patient   For questions or updates, please contact [facility] Please consult [email or web address] for contact info under [facility] Cardiology.    Signed, Ida Rogue, MD  05/06/2018, 4:37 PM

## 2018-05-06 NOTE — Progress Notes (Signed)
Patient foley d/cd at 1830.  Tolerated well.  No complaints.

## 2018-05-06 NOTE — Progress Notes (Signed)
[facility]  ROUNDING NOTE   Subjective:   Family at bedside.  Patient more alert and awake this morning  UOP 3200  Creatinine 1.77 (2.02) (1.85)  Objective:  Vital signs in last 24 hours:  Temp:  [98.1 F (36.7 C)-99.3 F (37.4 C)] 98.2 F (36.8 C) (11/20 0400) Pulse Rate:  [58-82] 58 (11/20 0600) Resp:  [9-19] 12 (11/20 0600) BP: (92-150)/(46-78) 123/60 (11/20 0600) SpO2:  [92 %-99 %] 97 % (11/20 0600) Weight:  [81.2 kg] 81.2 kg (11/20 0500)  Weight change: -1.3 kg Filed Weights   05/03/18 0500 05/05/18 0427 05/06/18 0500  Weight: 83.9 kg 82.5 kg 81.2 kg    Intake/Output: I/O last 3 completed shifts: In: 2351.7 [I.V.:1221.5; NG/GT:739.8; IV Piggyback:390.5] Out: 4967 [Urine:4300; Stool:530]   Intake/Output this shift:  No intake/output data recorded.  Physical Exam: General: ill appearing  Head: [place]/AT  Eyes: Anicteric  Neck: Supple, trachea midline  Lungs:  clear  Heart: regular  Abdomen:  Soft, nontender, bowel sounds present  Extremities: no peripheral edema.  Neurologic: Awake and alert  Skin: No lesions       Basic Metabolic Panel: Recent Labs  Lab 05/01/18 1719 05/02/18 0201 05/02/18 1655 05/03/18 0444 05/04/18 0558 05/04/18 0600 05/05/18 0435 05/06/18 0534  NA  --  145  --  142  --  143 142 145  K  --  3.9  --  3.8  --  4.3 4.1 4.8  CL  --  103  --  99  --  99 101 106  CO2  --  27  --  29  --  33* 32 29  GLUCOSE  --  151*  --  210*  --  171* 163* 134*  BUN  --  52*  --  57*  --  70* 81* 65*  CREATININE  --  1.96*  --  1.89*  --  1.85* 2.02* 1.77*  CALCIUM  --  8.8*  --  8.5*  --  8.6* 8.1* 8.3*  MG 2.4 2.3 2.1  --  2.5*  --   --  2.6*  PHOS 2.8 3.5 3.9  --  5.3*  --   --  4.7*    Liver Function Tests: Recent Labs  Lab 04/30/18 0423 05/06/18 0534  AST 52* 151*  ALT 35 88*  ALKPHOS 59 44  BILITOT 0.6 0.8  PROT 5.9* 5.4*  ALBUMIN 2.7* 2.5*   No results for input(s): LIPASE, AMYLASE in the last 168 hours. No  results for input(s): AMMONIA in the last 168 hours.  CBC: Recent Labs  Lab 04/30/18 0423  05/03/18 0444 05/04/18 0558 05/05/18 0435 05/05/18 0723 05/05/18 2047 05/06/18 0534  WBC 9.7   < > 10.6* 17.5* 11.0* 11.2*  --  13.2*  NEUTROABS 9.0*  --   --   --   --  9.1*  --  11.7*  HGB 8.5*   < > 9.2* 8.9* 6.7* 6.7* 9.0* 8.1*  HCT 26.3*   < > 28.8* 28.0* 21.1* 20.8* 27.7* 25.1*  MCV 88.6   < > 91.1 91.2 91.7 91.6  --  90.3  PLT 126*   < > 172 226 159 155  --  164   < > = values in this interval not displayed.    Cardiac Enzymes: No results for input(s): CKTOTAL, CKMB, CKMBINDEX, TROPONINI in the last 168 hours.  BNP: Invalid input(s): POCBNP  CBG: Recent Labs  Lab 05/05/18 1611 05/05/18 1939 05/06/18 0013 05/06/18 0414 05/06/18  [place]    Microbiology: Results for orders placed or performed during the hospital encounter of 04/26/18  Blood culture (routine x 2)     Status: None   Collection Time: 04/26/18  1:54 AM  Result Value Ref Range Status   Specimen Description BLOOD LEFT FATTY CASTS  Final   Special Requests   Final    BOTTLES DRAWN AEROBIC AND ANAEROBIC Blood Culture adequate volume   Culture   Final    NO GROWTH 5 DAYS Performed at [facility], [address] [place], [place] [address]    Report Status 05/01/2018 FINAL  Final  Blood culture (routine x 2)     Status: None   Collection Time: 04/26/18  1:55 AM  Result Value Ref Range Status   Specimen Description BLOOD LEFT ASSIST CONTROL  Final   Special Requests   Final    BOTTLES DRAWN AEROBIC AND ANAEROBIC Blood Culture results may not be optimal due to an excessive volume of blood received in culture bottles   Culture   Final    NO GROWTH 5 DAYS Performed at [facility], [address] [place], [place] [address]    Report Status 05/01/2018 FINAL  Final  MRSA PCR Screening     Status: None   Collection Time: 04/26/18  2:54 AM  Result Value Ref  Range Status   MRSA by PCR NEGATIVE NEGATIVE Final    Comment:        The GeneXpert MRSA Assay (FDA approved for NASAL specimens only), is one component of a comprehensive MRSA colonization surveillance program. It is not intended to diagnose MRSA infection nor to guide or monitor treatment for MRSA infections. Performed at [facility], [address] [place], [place] [address]   Culture, respiratory (non-expectorated)     Status: None   Collection Time: 04/27/18 10:06 AM  Result Value Ref Range Status   Specimen Description   Final    TRACHEAL ASPIRATE Performed at [facility], [address] [place], [place] [address]    Special Requests   Final    NONE Performed at [facility], [address] [place], [place] [address]    Gram Stain   Final    FEW WBC PRESENT, PREDOMINANTLY PMN RARE GRAM POSITIVE RODS    Culture   Final    FEW Consistent with normal respiratory flora. Performed at [facility], [place] [address], [place], [place] [address]    Report Status 04/29/2018 FINAL  Final  CULTURE, BLOOD (ROUTINE X 2) w Reflex to ID Panel     Status: None   Collection Time: 04/28/18 11:52 AM  Result Value Ref Range Status   Specimen Description BLOOD LEFT HAND  Final   Special Requests   Final    BOTTLES DRAWN AEROBIC AND ANAEROBIC Blood Culture results may not be optimal due to an inadequate volume of blood received in culture bottles   Culture   Final    NO GROWTH 5 DAYS Performed at [facility], [address] [place], [place] [address]    Report Status 05/03/2018 FINAL  Final  Culture, blood (Routine X 2) w Reflex to ID Panel     Status: None   Collection Time: 04/28/18  1:47 PM  Result Value Ref Range Status   Specimen Description BLOOD BLOOD LEFT HAND  Final   Special Requests   Final    BOTTLES DRAWN AEROBIC AND  ANAEROBIC Blood Culture adequate volume   Culture   Final    NO GROWTH 5 DAYS Performed at [facility], [address] [place], [place] [address]    Report Status 05/03/2018 FINAL  Final    Coagulation Studies: No results for input(s): LABPROT, INR in the last 72 hours.  Urinalysis: No results for input(s): COLORURINE, LABSPEC, PHURINE, GLUCOSEU, HGBUR, BILIRUBINUR, KETONESUR, PROTEINUR, UROBILINOGEN, NITRITE, LEUKOCYTESUR in the last 72 hours.  Invalid input(s): APPERANCEUR    Imaging: Dg Chest Port 1 View  Result Date: 05/06/2018 CLINICAL DATA:  Pneumonia. EXAM: PORTABLE CHEST 1 VIEW COMPARISON:  Radiograph of May 05, 2018. FINDINGS: Stable cardiomediastinal silhouette. Left-sided pacemaker is unchanged in position. Right-sided PICC line is unchanged in position. Endotracheal and nasogastric tubes have been removed. No pneumothorax is noted. Mild bibasilar subsegmental atelectasis is noted with possible small left pleural effusion. Bony thorax is unremarkable. IMPRESSION: Endotracheal and nasogastric tubes have been removed. Mild bibasilar subsegmental atelectasis is noted with possible small left pleural effusion. Electronically Signed   By: Marijo Conception, M.D.   On: 05/06/2018 07:22   Dg Chest Port 1 View  Result Date: 05/05/2018 CLINICAL DATA:  Pneumonia. EXAM: PORTABLE CHEST 1 VIEW COMPARISON:  Radiograph of May 01, 2018. FINDINGS: Stable cardiomediastinal silhouette. Endotracheal and nasogastric tubes are unchanged in position. Left-sided pacemaker is unchanged in position. No pneumothorax is noted. Mild bibasilar subsegmental atelectasis is noted with probable minimal pleural effusions. Bony thorax is unremarkable. IMPRESSION: Stable support apparatus. Mildly increased bibasilar subsegmental atelectasis with minimal pleural effusions. Electronically Signed   By: Marijo Conception, M.D.   On: 05/05/2018 07:20     Medications:   . amiodarone 60 mg/hr (05/06/18 0600)  . ampicillin-sulbactam (UNASYN) IV 3 g (05/06/18 0421)  . dexmedetomidine (PRECEDEX) IV  infusion 0.7 mcg/kg/hr (05/06/18 0600)   . aspirin  81 mg Per Tube Daily  . atorvastatin  40 mg Per Tube Daily  . budesonide (PULMICORT) nebulizer solution  0.5 mg Nebulization BID  . chlorhexidine gluconate (MEDLINE KIT)  15 mL Mouth Rinse BID  . clonazepam  0.25 mg Oral BID  . insulin aspart  0-9 Units Subcutaneous Q4H  . ipratropium-albuterol  3 mL Nebulization Q4H  . lipase/protease/amylase  12,000 Units Oral Q8H  . mouth rinse  15 mL Mouth Rinse 10 times per day  . metoprolol tartrate  12.5 mg Oral BID  . oxyCODONE-acetaminophen  1 tablet Oral Q6H  . pantoprazole (PROTONIX) IV  40 mg Intravenous Q12H  . QUEtiapine  100 mg Oral BID  . sodium chloride flush  10-40 mL Intracatheter Q12H   acetaminophen **OR** acetaminophen, bisacodyl, bisacodyl, fentaNYL (SUBLIMAZE) injection, midazolam, ondansetron **OR** ondansetron (ZOFRAN) IV, sennosides, sodium chloride flush  Assessment/ Plan:  77 y.o. white male with a PMHx of abdominal aortic aneurysm, coronary artery disease, congestive heart failure, exocrine pancreatic insufficiency, chronic kidney disease stage III baseline creatinine 1.6, prostate cancer, hypertension   1.  Acute renal failure on chronic kidney disease stage III baseline creatinine 1.98, GFR of 32 in 08/08/2015. Creatinine improving to stable.  Nonoliguric urine output.   2.  Hypertension - furosemide IV - monitor contraction alkalosis - cardiology following  3.  Acute respiratory failure status post intubation x 2 on this admission. Extubated this morning.  - steroids - Unasyn.   4. Anemia with acute renal failure: hemoglobin 8.1. Status post PRBC transfusion 11/19    LOS: 10 Annalena Piatt 11/20/20199:33 AM

## 2018-05-06 NOTE — Progress Notes (Signed)
Pt has a severity score of 1 on the RT protocol assessment test therefore the nebulizer treatments are changed to BID.

## 2018-05-06 NOTE — Progress Notes (Signed)
Name: Matthew Zamora MRN: [id number] DOB: 1941/01/27     CONSULTATION DATE: 04/26/2018 Subjective & Objectives: Amiodarone + Precedex.  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 pylori infection, History of hepatitis, Hyperlipidemia, Hypertension, Liver cyst (05/16/15), and Prostate CA ([place]).  has a past surgical history that includes Prostate surgery; Insertion of ICD; Cataract extraction; Tonsillectomy; Colonoscopy (10/06/2014, 09/18/2004, 03/14/2009); Flexible sigmoidoscopy (08/26/1990); and Esophagogastroduodenoscopy (10/06/2014, 03/18/2001, 03/14/2009). Prior to Admission medications   Medication Sig Start Date End Date Taking? Authorizing Provider  aspirin EC 81 MG tablet Take 81 mg by mouth daily.   Yes [provider]  atorvastatin (LIPITOR) 40 MG tablet Take 40 mg by mouth daily.   Yes [provider]  clopidogrel (PLAVIX) 75 MG tablet Take 75 mg by mouth daily. 04/15/18  Yes [provider]  furosemide (LASIX) 40 MG tablet Take 20 mg by mouth daily.   Yes [provider]  lisinopril (PRINIVIL,ZESTRIL) 5 MG tablet Take 5 mg by mouth daily.   Yes [provider]  magnesium (MAGTAB) 84 MG (7MEQ) TBCR SR tablet Take 84 mg by mouth daily.   Yes [provider]  Melatonin 1 MG TABS Take 1 tablet by mouth at bedtime as needed. 12/27/13  Yes [provider]  metoprolol succinate (TOPROL-XL) 100 MG 24 hr tablet Take 100 mg by mouth daily. Take with or immediately following a meal.   Yes [provider]  Multiple Vitamins-Minerals (MULTIVITAMIN WITH MINERALS) tablet Take 1 tablet by mouth daily.   Yes  [provider]  omeprazole (PRILOSEC) 20 MG capsule Take 20 mg by mouth daily.   Yes [provider]  oxybutynin (DITROPAN XL) 15 MG 24 hr tablet Take 15 mg by mouth at bedtime.   Yes [provider]  lipase/protease/amylase (CREON) 12000 units CPEP capsule Take 24,000 Units by mouth 2 (two) times daily at 10 am and 4 pm.    [provider]   No Known Allergies  FAMILY HISTORY:  family history includes Heart attack in his father and mother. SOCIAL HISTORY:  reports that he quit smoking about 18 years ago. His smoking use included cigarettes. He has a 38.00 pack-year smoking history. He has never used smokeless tobacco. He reports that he does not drink alcohol.  REVIEW OF SYSTEMS:   Unable to obtain due to critical illness   VITAL SIGNS: Temp:  [98.1 F (36.7 C)-99.3 F (37.4 C)] 98.2 F (36.8 C) (11/20 0400) Pulse Rate:  [58-82] 58 (11/20 0600) Resp:  [9-19] 12 (11/20 0600) BP: (91-150)/(45-78) 123/60 (11/20 0600) SpO2:  [92 %-99 %] 97 % (11/20 0600) Weight:  [81.2 kg] 81.2 kg (11/20 0500)  Physical Examination: A + O, no acute focal motor deficits and following simple commands On RA, no distress,able to talk in full sentences. BEAE and no rales S1 & S2 audible and no murmur Benign abdomen with feeble peristalses No edema   ASSESSMENT / PLAN:  Acute respiratory failure (improved). Extubated on 05/05/2018 and tolerating [place] -Monitor work of breathing and O2 sat  *Altered mental status (agitation) with possible steroid induced psychosis / metabolic encephalopathy. 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 CT head -ve for acute intracranial abnormality. -Taper off steroids -Optimize Klonopin and  taper off Precedex as tolerarted -Monitor neuro status and follow with neurology recommendation  Pneumonia. improved bibasilar airspace disease with pulmonary congestion -Empiric Unasyn. S/p Vanc + Zithro +  Zosyn -Diuresis to improve lung compliance -Monitor CXR + CBC + FIO2  Septic shock. Off pressers -Monitor hemodynamics  A fib with RVR, elevated Trop with demand vs. Supply mismatch. Base line HFrEF 40-45% and CAD -Amiodarone + Heparin gtt (will d/c because of HB drop)+ ASA + Plavix + BB+ Statin  AKI (improved) on CKD St III. -management as per renal  HTN.  -Optimize antihypertensives and monitor hemodynamics  Pancreatic insufficiency and elevated LFTs -Pancreatic enzymes supplements -Monitor LFT and consider liver /S if no improvement  Anemia.  -Keep HB > 7 gm/dl. -Off Heparin for A fib because of HB drop requiring transfusion  Full code  DV & GI prophylaxis. Continue with supportive care Family was updated at the bedside and they agreed to the plan of care  Critical care time 35 min

## 2018-05-06 NOTE — Care Management Note (Signed)
Case Management Note  Patient Details  Name: Matthew Zamora MRN: [id number] Date of Birth: May 16, 1941  Subjective/Objective:    Patient is from home alone.  Admitted with acute respiratory failure; pneumonia.  Transferred to 2A today from ICU.  Son [name] is at bedside.  Patient was independent in all ADL's prior to this hospitalization.  States he started to get sick in July and it slowly progressed.  Denies difficulties obtaining medications or with medical care.  He is current with his PCP.  Not sure if he has a functioning scale at home.  He states if he doesn't he can buy on.  Explained the importance of weighing daily.  He follows up at [facility] for his cardiologist.  His son's daughter is a Designer, jewellery and plans to keep a close eye on him. Offered home health options and made a heads up referral to [facility] for RN, PT, SW and aide.  Patient may benefit from short term rehab.  PT has not evaluated yet as he has just transferred from ICU. Palliative consult pending.         Action/Plan:   Expected Discharge Date:                  Expected Discharge Plan:     In-House Referral:     Discharge planning Services  CM Consult  Post Acute Care Choice:    Choice offered to:     DME Arranged:    DME Agency:     HH Arranged:  RN, PT, Nurse's Aide, Social Work [employer] Agency:  [facility]  Status of Service:  In process, will continue to follow  If discussed at Long Length of Stay Meetings, dates discussed:    Additional Comments:  [name], RN 05/06/2018, 3:17 PM

## 2018-05-06 NOTE — Progress Notes (Signed)
Patient transferred to 235 from ICU. Report called and given to this nurse. Patient arrived wit family at bedside.  Flexiseal and foley in place.  States no c/o pain at this time. Oriented to room and surroundings.  Patient arms elevated.  Edema noted in LUE.  Call bell in reach.  Patient on amio drip that was started in ICU.  Running without issues.

## 2018-05-06 NOTE — Consult Note (Addendum)
[facility] GI Inpatient Consult Note   Kathline Magic, M.D.  Reason for Consult: Hemoccult positive stool, anemia   Attending Requesting Consult: Cassandria Santee, M.D.  Outpatient Primary Physician: Maryland Pink, M.D.  History of Present Illness: Matthew Zamora is a 77 y.o. male with a history of congestive heart failure, pancreatic exocrine insufficiency, Barrett's esophagus and tubular adenomatous polyps of the colon who was admitted after being found unconscious at home.  It was found the patient had atrial fibrillation with a rapid ventricular response and he was promptly admitted.  He has been seen by cardiology who is controlled the patient's heart rate and it currently patient denies any complaints of palpitations shortness of breath or chest pain.  The GI service was called due to Hemoccult positive stool and a decrease in the baseline hemoglobin from 11.5 to around 8.4.  The patient denies any upper GI symptoms other than "excessive belching" over the past few weeks.  He denies any overt melena, hematemesis or hematochezia symptoms.  His last colonoscopy was performed in 2016 at [facility] and was shown to have a small ascending colon polyp and mild diverticular disease of the colon.  Patient's EGD appears to have been done last in 2010 and showed Barrett's esophagus without dysplasia.  Patient has failed to follow-up for surveillance endoscopy since that time to monitor the Barrett's esophagus.  Past Medical History:  Past Medical History:  Diagnosis Date  . Abdominal aortic aneurysm (AAA) ([place]) 05/13/15   seen on ct scan  . 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 Anemia   . 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 CAD (coronary artery disease)   . Cataract cortical, senile   . CHF (congestive heart failure) ([place])   . Chronic hoarseness   . Exocrine pancreatic insufficiency   . H. pylori infection   . History of hepatitis   . Hyperlipidemia    . Hypertension   . 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 Prostate CA [facility])     Problem List: Patient Active Problem List   Diagnosis Date Noted  . Paroxysmal atrial fibrillation ([name])   . Acute respiratory failure ([place]) 04/26/2018    Past Surgical History: Past Surgical History:  Procedure Laterality Date  . CATARACT EXTRACTION    . COLONOSCOPY  10/06/2014, 09/18/2004, 03/14/2009  . ESOPHAGOGASTRODUODENOSCOPY  10/06/2014, 03/18/2001, 03/14/2009  . FLEXIBLE SIGMOIDOSCOPY  08/26/1990  . INSERTION OF ICD    . PROSTATE SURGERY    . TONSILLECTOMY      Allergies: No Known Allergies  Home Medications: Medications Prior to Admission  Medication Sig Dispense Refill Last Dose  . aspirin EC 81 MG tablet Take 81 mg by mouth daily.   Unknown at Unknown  . atorvastatin (LIPITOR) 40 MG tablet Take 40 mg by mouth daily.   Unknown at Unknown  . clopidogrel (PLAVIX) 75 MG tablet Take 75 mg by mouth daily.  5 Unknown at Unknown  . furosemide (LASIX) 40 MG tablet Take 20 mg by mouth daily.   Unknown at Unknown  . lisinopril (PRINIVIL,ZESTRIL) 5 MG tablet Take 5 mg by mouth daily.   Unknown at Unknown  . magnesium (MAGTAB) 84 MG (7MEQ) TBCR SR tablet Take 84 mg by mouth daily.   Unknown at Unknown  . Melatonin 1 MG TABS Take 1 tablet by mouth at bedtime as needed.   prn at prn  . metoprolol succinate (TOPROL-XL) 100 MG 24 hr tablet Take 100 mg by mouth daily. Take with or immediately  following a meal.   Unknown at Unknown  . Multiple Vitamins-Minerals (MULTIVITAMIN WITH MINERALS) tablet Take 1 tablet by mouth daily.   Unknown at Unknown  . omeprazole (PRILOSEC) 20 MG capsule Take 20 mg by mouth daily.   Unknown at Unknown  . oxybutynin (DITROPAN XL) 15 MG 24 hr tablet Take 15 mg by mouth at bedtime.   Unknown at Unknown  . lipase/protease/amylase (CREON) 12000 units CPEP capsule Take 24,000 Units by mouth 2 (two) times daily at 10 am and 4 pm.   Not Taking at Unknown time   Home medication reconciliation was  completed with the patient.   Scheduled Inpatient Medications:   . [START ON 05/07/2018] aspirin  81 mg Oral Daily  . atorvastatin  40 mg Oral Daily  . budesonide (PULMICORT) nebulizer solution  0.5 mg Nebulization BID  . clonazepam  0.25 mg Oral BID  . feeding supplement (ENSURE ENLIVE)  237 mL Oral BID BM  . insulin aspart  0-9 Units Subcutaneous Q4H  . ipratropium-albuterol  3 mL Nebulization BID  . lipase/protease/amylase  12,000 Units Oral Q8H  . mouth rinse  15 mL Mouth Rinse BID  . metoprolol tartrate  25 mg Oral BID  . multivitamin with minerals  1 tablet Oral Daily  . oxyCODONE-acetaminophen  1 tablet Oral Q6H  . pantoprazole (PROTONIX) IV  40 mg Intravenous Q12H  . QUEtiapine  100 mg Oral BID  . sodium chloride flush  10-40 mL Intracatheter Q12H    Continuous Inpatient Infusions:   . amiodarone 60 mg/hr (05/06/18 1507)    PRN Inpatient Medications:  acetaminophen **OR** acetaminophen, bisacodyl, bisacodyl, fentaNYL (SUBLIMAZE) injection, ondansetron **OR** ondansetron (ZOFRAN) IV, senna-docusate, sodium chloride flush  Family History: family history includes Heart attack in his father and mother.   GI Family History: Negative  Social History:   reports that he quit smoking about 18 years ago. His smoking use included cigarettes. He has a 38.00 pack-year smoking history. He has never used smokeless tobacco. He reports that he does not drink alcohol. The patient denies ETOH, tobacco, or drug use.    Review of Systems: Review of Systems - History obtained from the patient General ROS: positive for  - fatigue negative for - fever, hot flashes, sleep disturbance or weight loss Psychological ROS: negative Ophthalmic ROS: negative ENT ROS: negative for - epistaxis or visual changes Hematological and Lymphatic ROS: positive for - bruising negative for - blood clots or blood transfusions Endocrine ROS: negative Respiratory ROS: no cough, shortness of breath, or  wheezing Cardiovascular ROS: no chest pain or dyspnea on exertion positive for - irregular heartbeat Gastrointestinal ROS: no abdominal pain, change in bowel habits, or black or bloody stools Genito-Urinary ROS: no dysuria, trouble voiding, or hematuria Musculoskeletal ROS: negative Neurological ROS: no TIA or stroke symptoms Dermatological ROS: positive for dry skin and skin lesion changes negative for pruritus  Physical Examination: BP (!) 162/74 (BP Location: Left Arm)   Pulse 98   Temp 98.1 F (36.7 C) (Oral)   Resp 18   Ht 5\' 11"  (1.803 m)   Wt 84.2 kg   SpO2 98%   BMI 25.89 kg/m  Physical Exam  Constitutional: He is oriented to person, place, and time. He appears well-developed and well-nourished. No distress.  HENT:  Head: Normocephalic and atraumatic.  Eyes: Pupils are equal, round, and reactive to light. Conjunctivae are normal.  Neck: Normal range of motion. No tracheal deviation present.  Cardiovascular: Normal rate and normal pulses. An  irregularly irregular rhythm present. Exam reveals no gallop.  Pulmonary/Chest: Effort normal and breath sounds normal.  Abdominal: Soft. Bowel sounds are normal. He exhibits no distension.  Musculoskeletal: Normal range of motion. He exhibits no edema.  Neurological: He is alert and oriented to person, place, and time.  Skin: Skin is warm and dry. Abrasion, bruising and ecchymosis noted. He is not diaphoretic.  Psychiatric: He has a normal mood and affect.    Data: Lab Results  Component Value Date   WBC 13.2 (H) 05/06/2018   HGB 8.4 (L) 05/06/2018   HCT 25.6 (L) 05/06/2018   MCV 90.3 05/06/2018   PLT 164 05/06/2018   Recent Labs  Lab 05/05/18 2047 05/06/18 0534 05/06/18 1546  HGB 9.0* 8.1* 8.4*   Lab Results  Component Value Date   NA 145 05/06/2018   K 4.8 05/06/2018   CL 106 05/06/2018   CO2 29 05/06/2018   BUN 65 (H) 05/06/2018   CREATININE 1.77 (H) 05/06/2018   Lab Results  Component Value Date   ALT 88  (H) 05/06/2018   AST 151 (H) 05/06/2018   ALKPHOS 44 05/06/2018   BILITOT 0.8 05/06/2018   Recent Labs  Lab 05/03/18 1403  APTT 78*   CBC Latest Ref Rng & Units 05/06/2018 05/06/2018 05/05/2018  WBC 4.0 - 10.5 K/uL - 13.2(H) -  Hemoglobin 13.0 - 17.0 g/dL 8.4(L) 8.1(L) 9.0(L)  Hematocrit 39.0 - 52.0 % 25.6(L) 25.1(L) 27.7(L)  Platelets 150 - 400 K/uL - 164 -    STUDIES: Dg Chest Port 1 View  Result Date: 05/06/2018 CLINICAL DATA:  Pneumonia. EXAM: PORTABLE CHEST 1 VIEW COMPARISON:  Radiograph of May 05, 2018. FINDINGS: Stable cardiomediastinal silhouette. Left-sided pacemaker is unchanged in position. Right-sided PICC line is unchanged in position. Endotracheal and nasogastric tubes have been removed. No pneumothorax is noted. Mild bibasilar subsegmental atelectasis is noted with possible small left pleural effusion. Bony thorax is unremarkable. IMPRESSION: Endotracheal and nasogastric tubes have been removed. Mild bibasilar subsegmental atelectasis is noted with possible small left pleural effusion. Electronically Signed   By: Marijo Conception, M.D.   On: 05/06/2018 07:22   Dg Chest Port 1 View  Result Date: 05/05/2018 CLINICAL DATA:  Pneumonia. EXAM: PORTABLE CHEST 1 VIEW COMPARISON:  Radiograph of May 01, 2018. FINDINGS: Stable cardiomediastinal silhouette. Endotracheal and nasogastric tubes are unchanged in position. Left-sided pacemaker is unchanged in position. No pneumothorax is noted. Mild bibasilar subsegmental atelectasis is noted with probable minimal pleural effusions. Bony thorax is unremarkable. IMPRESSION: Stable support apparatus. Mildly increased bibasilar subsegmental atelectasis with minimal pleural effusions. Electronically Signed   By: Marijo Conception, M.D.   On: 05/05/2018 07:20   @IMAGES @  Assessment: 1.  Anemia-previously deemed to be due to chronic disease although there is been a decrease in the patient's baseline hemoglobin by about 3 g.  Patient was  noted to be Hemoccult positive as well on examination and gastroenterology has been asked to consider evaluation.  2.  Atrial fibrillation with rapid ventricular response-heart rate is now controlled and patient status post pacemaker a year ago with implantable cardiac defibrillator.  He is being followed by Dr. Rockey Situ of cardiology.  Anticoagulation has been ruled out given recent decrease in hemoglobin as well as Hemoccult positive stool.  3.  History of adenomatous colon polyps status post colonoscopy 2016.  4.  History of Barrett's esophagus.  5.  Pancreatic exocrine insufficiency.  6.  Coronary artery disease-thought to be having some demand ischemia due to  recent blood loss per cardiology opinion.  7. Regular NSAID use. One regular aspirin daily.   Recommendations: 1.  Continue acid suppression. 2.  Serial hematocrit determinations. 3.  EGD tomorrow will be planned.The patient understands the nature of the planned procedure, indications, risks, alternatives and potential complications including but not limited to bleeding, infection, perforation, damage to internal organs and possible oversedation/side effects from anesthesia. The patient agrees and gives consent to proceed.  Please refer to procedure notes for findings, recommendations and patient disposition/instructions.  Thank you for the consult. Please call with questions or concerns.  Olean Ree, "Lanny Hurst MD [facility] [place], [place] [id number] [phone]  05/06/2018 7:23 PM

## 2018-05-06 NOTE — Progress Notes (Signed)
Patient ID: Matthew Zamora, male   DOB: May 04, 1941, 77 y.o.   MRN: [id number]  [employer] PROGRESS NOTE  RAYNELL SCOTT WUJ:[id number] DOB: 03/06/1941 DOA: 04/26/2018 PCP: Maryland Pink, MD  HPI/Subjective Doing well, on N.C.  Objective: Vitals:   05/06/18 1300 05/06/18 1327  BP: (!) 161/87 (!) 162/74  Pulse: 83 98  Resp: (!) 25 18  Temp:    SpO2: 96% 98%    Filed Weights   05/05/18 0427 05/06/18 0500 05/06/18 1327  Weight: 82.5 kg 81.2 kg 84.2 kg    ROS: Review of Systems  Constitutional: Positive for malaise/fatigue. Negative for chills, fever and weight loss.  HENT: Negative for nosebleeds and sore throat.   Eyes: Negative for blurred vision.  Respiratory: Negative for cough, shortness of breath and wheezing.   Cardiovascular: Negative for chest pain, orthopnea, leg swelling and PND.  Gastrointestinal: Negative for abdominal pain, constipation, diarrhea, heartburn, nausea and vomiting.  Genitourinary: Negative for dysuria and urgency.  Musculoskeletal: Negative for back pain.  Skin: Negative for rash.  Neurological: Negative for dizziness, speech change, focal weakness and headaches.  Endo/Heme/Allergies: Does not bruise/bleed easily.  Psychiatric/Behavioral: Negative for depression.   Exam: Physical Exam  HENT:  Nose: No mucosal edema.  Mouth/Throat: No oropharyngeal exudate or posterior oropharyngeal edema.  Unable to look into mouth  Eyes: Conjunctivae and lids are normal.  Neck: No JVD present. Carotid bruit is not present. No edema present. No thyroid mass and no thyromegaly present.  Cardiovascular: Regular rhythm, S1 normal, S2 normal and normal heart sounds. Exam reveals no gallop.  No murmur heard. Pulses:      Dorsalis pedis pulses are 2+ on the right side, and 2+ on the left side.  Respiratory: No respiratory distress. He has decreased breath sounds in the right middle field, the right lower field, the left middle field and the left  lower field. He has no wheezes. He has no rhonchi. He has no rales.  GI: Soft. Bowel sounds are normal. There is no tenderness.  Musculoskeletal:       Right ankle: He exhibits no swelling.       Left ankle: He exhibits no swelling.  Lymphadenopathy:    He has no cervical adenopathy.  Neurological: He is alert.  Skin: Skin is warm. No rash noted. Nails show no clubbing.    Data Reviewed: Basic Metabolic Panel: Recent Labs  Lab 05/01/18 1719 05/02/18 0201 05/02/18 1655 05/03/18 0444 05/04/18 0558 05/04/18 0600 05/05/18 0435 05/06/18 0534  NA  --  145  --  142  --  143 142 145  K  --  3.9  --  3.8  --  4.3 4.1 4.8  CL  --  103  --  99  --  99 101 106  CO2  --  27  --  29  --  33* 32 29  GLUCOSE  --  151*  --  210*  --  171* 163* 134*  BUN  --  52*  --  57*  --  70* 81* 65*  CREATININE  --  1.96*  --  1.89*  --  1.85* 2.02* 1.77*  CALCIUM  --  8.8*  --  8.5*  --  8.6* 8.1* 8.3*  MG 2.4 2.3 2.1  --  2.5*  --   --  2.6*  PHOS 2.8 3.5 3.9  --  5.3*  --   --  4.7*   Liver Function Tests: Recent Labs  Lab 04/30/18 0423  05/06/18 0534  AST 52* 151*  ALT 35 88*  ALKPHOS 59 44  BILITOT 0.6 0.8  PROT 5.9* 5.4*  ALBUMIN 2.7* 2.5*   CBC: Recent Labs  Lab 04/30/18 0423  05/03/18 0444 05/04/18 0558 05/05/18 0435 05/05/18 0723 05/05/18 2047 05/06/18 0534 05/06/18 1546  WBC 9.7   < > 10.6* 17.5* 11.0* 11.2*  --  13.2*  --   NEUTROABS 9.0*  --   --   --   --  9.1*  --  11.7*  --   HGB 8.5*   < > 9.2* 8.9* 6.7* 6.7* 9.0* 8.1* 8.4*  HCT 26.3*   < > 28.8* 28.0* 21.1* 20.8* 27.7* 25.1* 25.6*  MCV 88.6   < > 91.1 91.2 91.7 91.6  --  90.3  --   PLT 126*   < > 172 226 159 155  --  164  --    < > = values in this interval not displayed.   Cardiac Enzymes: No results for input(s): CKTOTAL, CKMB, CKMBINDEX, TROPONINI in the last 168 hours. BNP (last 3 results) Recent Labs    04/26/18 0030 04/27/18 1054 04/29/18 0302  BNP 717.0* 799.0* 889.0*    CBG: Recent Labs  Lab  05/05/18 1939 05/06/18 0013 05/06/18 0414 05/06/18 0725 05/06/18 1612  GLUCAP 112* 112* 120* 120* 129*    Recent Results (from the past 240 hour(s))  Culture, respiratory (non-expectorated)     Status: None   Collection Time: 04/27/18 10:06 AM  Result Value Ref Range Status   Specimen Description   Final    TRACHEAL ASPIRATE Performed at [facility], [address] [place], [place] [address]    Special Requests   Final    NONE Performed at [facility], [address] [place], [place] [address]    Gram Stain   Final    FEW WBC PRESENT, PREDOMINANTLY PMN RARE GRAM POSITIVE RODS    Culture   Final    FEW Consistent with normal respiratory flora. Performed at [facility], [place] [address], [place], [place] [address]    Report Status 04/29/2018 FINAL  Final  CULTURE, BLOOD (ROUTINE X 2) w Reflex to ID Panel     Status: None   Collection Time: 04/28/18 11:52 AM  Result Value Ref Range Status   Specimen Description BLOOD LEFT HAND  Final   Special Requests   Final    BOTTLES DRAWN AEROBIC AND ANAEROBIC Blood Culture results may not be optimal due to an inadequate volume of blood received in culture bottles   Culture   Final    NO GROWTH 5 DAYS Performed at [facility], [address] [place], [place] [address]    Report Status 05/03/2018 FINAL  Final  Culture, blood (Routine X 2) w Reflex to ID Panel     Status: None   Collection Time: 04/28/18  1:47 PM  Result Value Ref Range Status   Specimen Description BLOOD BLOOD LEFT HAND  Final   Special Requests   Final    BOTTLES DRAWN AEROBIC AND ANAEROBIC Blood Culture adequate volume   Culture   Final    NO GROWTH 5 DAYS Performed at [facility], [address] [place], [place] [address]    Report Status 05/03/2018 FINAL  Final     Studies: Dg Chest Port 1 View  Result Date: 05/06/2018 CLINICAL DATA:  Pneumonia. EXAM: PORTABLE CHEST 1 VIEW COMPARISON:  Radiograph of  May 05, 2018. FINDINGS:  Stable cardiomediastinal silhouette. Left-sided pacemaker is unchanged in position. Right-sided PICC line is unchanged in position. Endotracheal and nasogastric tubes have been removed. No pneumothorax is noted. Mild bibasilar subsegmental atelectasis is noted with possible small left pleural effusion. Bony thorax is unremarkable. IMPRESSION: Endotracheal and nasogastric tubes have been removed. Mild bibasilar subsegmental atelectasis is noted with possible small left pleural effusion. Electronically Signed   By: Marijo Conception, M.D.   On: 05/06/2018 07:22   Dg Chest Port 1 View  Result Date: 05/05/2018 CLINICAL DATA:  Pneumonia. EXAM: PORTABLE CHEST 1 VIEW COMPARISON:  Radiograph of May 01, 2018. FINDINGS: Stable cardiomediastinal silhouette. Endotracheal and nasogastric tubes are unchanged in position. Left-sided pacemaker is unchanged in position. No pneumothorax is noted. Mild bibasilar subsegmental atelectasis is noted with probable minimal pleural effusions. Bony thorax is unremarkable. IMPRESSION: Stable support apparatus. Mildly increased bibasilar subsegmental atelectasis with minimal pleural effusions. Electronically Signed   By: Marijo Conception, M.D.   On: 05/05/2018 07:20    Scheduled Meds: . [START ON 05/07/2018] aspirin  81 mg Oral Daily  . atorvastatin  40 mg Oral Daily  . budesonide (PULMICORT) nebulizer solution  0.5 mg Nebulization BID  . clonazepam  0.25 mg Oral BID  . feeding supplement (ENSURE ENLIVE)  237 mL Oral BID BM  . insulin aspart  0-9 Units Subcutaneous Q4H  . ipratropium-albuterol  3 mL Nebulization BID  . lipase/protease/amylase  12,000 Units Oral Q8H  . mouth rinse  15 mL Mouth Rinse BID  . metoprolol tartrate  25 mg Oral BID  . multivitamin with minerals  1 tablet Oral Daily  . oxyCODONE-acetaminophen  1 tablet Oral Q6H  . pantoprazole (PROTONIX) IV  40 mg Intravenous Q12H  . QUEtiapine  100 mg Oral BID  . sodium chloride  flush  10-40 mL Intracatheter Q12H   Continuous Infusions: . amiodarone 60 mg/hr (05/06/18 1507)    Assessment/Plan:  1. Septic shock secondary to pneumonia: completed Abx course 2. Acute hypoxic respiratory failure with respiratory acidosis: resolved. On N.C. 3. Atrial fibrillation with rapid ventricular response.  Echocardiogram showed an EF of 40 to 45%.  Continue amiodarone and metoprolol.   - Not a good candidate for anticoagulation given severe anemia 4. Acute metabolic encephalopathy.  CT scan negative.  Need continued monitoring on mental status. Intensivist is concerned about possible steroid induced psychosis, taper as can 5. Elevated troponin secondary to demand ischemia with septic shock.  On aspirin, plavix 6. Acute kidney injury on chronic kidney disease stage III.  Creatinine 2 7. Pancreatic insufficiency on pancreatic enzymes 8. Anemia of chronic disease.  Hemoglobin down to 8.4 9. Hyperlipidemia unspecified on Lipitor 10. History of prostate cancer 11. Acute on chronic systolic CHF: EF 18-56%, Lasix on hold 12. Abdomina aortic aneurysm: outpt f/up with vascular surgery 13. Secondary hyperparathyroidism: mgmt per Nephro    Code Status: Full code    Code Status Orders  (From admission, onward)         Start     Ordered   04/26/18 0254  Full code  Continuous     04/26/18 0253        Code Status History    This patient has a current code status but no historical code status.     Family Communication: Spoke with family at bedside today Disposition Plan: To be determined  Consultants:  Critical care specialist  Cardiology  Nephrology  Antibiotics: -Unasyn  Time spent: 15 minutes.  Case discussed with nursing staff  Marinell Igarashi [employer]

## 2018-05-06 NOTE — Evaluation (Signed)
Clinical/Bedside Swallow Evaluation Patient Details  Name: Matthew Zamora MRN: [id number] Date of Birth: 06/06/1941  Today's Date: 05/06/2018 Time: SLP Start Time (ACUTE ONLY): 0945 SLP Stop Time (ACUTE ONLY): 1045 SLP Time Calculation (min) (ACUTE ONLY): 60 min  Past Medical History:  Past Medical History:  Diagnosis Date  . Abdominal aortic aneurysm (AAA) ([place]) 05/13/15   seen on ct scan  . 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 Anemia   . 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 CAD (coronary artery disease)   . Cataract cortical, senile   . CHF (congestive heart failure) ([place])   . Chronic hoarseness   . Exocrine pancreatic insufficiency   . H. pylori infection   . History of hepatitis   . Hyperlipidemia   . Hypertension   . 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 Prostate CA [facility])    Past Surgical History:  Past Surgical History:  Procedure Laterality Date  . CATARACT EXTRACTION    . COLONOSCOPY  10/06/2014, 09/18/2004, 03/14/2009  . ESOPHAGOGASTRODUODENOSCOPY  10/06/2014, 03/18/2001, 03/14/2009  . FLEXIBLE SIGMOIDOSCOPY  08/26/1990  . INSERTION OF ICD    . PROSTATE SURGERY    . TONSILLECTOMY     HPI:  Pt is a 77 y.o. male with a known history of multiple medical issues including CAD, CKD3, pacemaker, HTN, anemia and other comorbidities. Per EMS report, patient called 911 because he was hot and very short of breath. At the arrival to emergency room, patient continued to be in severe respiratory distress and combative, hence he was intubated. Chest x-ray shows patchy left-sided airspace opacity may reflect worsening pneumonia or asymmetric pulmonary edema. Underlying vascular congestion noted. Pt was extubated on 04/29/18 doing well respiratory wise but still anxious, agitated and restless. Pt was then reintubated on 04/30/18 d/t extreme agitation w/precedex off, tachy, freq PVCs, hypertensive, face red, unable to be redirecrted, hypoxic at 80 on 2 liters, could not  tolerate NRB, Pt is now extubated again. He has been tolerating ice chips w/ NSG w/ no overt s/s of aspiration or decline in respiratory status noted per report.    Assessment / Plan / Recommendation Clinical Impression  Pt appeared to present w/ fairly adequate oropharyngeal phase swallowing function, however, min. inconsistent (x2) overt signs of throat clear/cough were noted during po trials of thin liquids via Cup. Pt consumed ~4 ozs total of thin liquids w/ support holding the Cup - he preferred drinking from the cup vs the straw attempted; this was recommended ultimately. Unsure if the throat clear/cough was related to pharyngeal phase dysphagia - no decline in HR/RR or O2 sats(96%) was noted during/post trials. Vocal quality did not appear wet or gurgly at any point during po trials when pt verbalized for SLP b/t trials. Pharyngeal swallow was [facility] for excursion. No significant oral phase deficits noted; adequate bolus management and timely A-P transfer w/ oral clearing noted. Pt needed min more oral phase time for mastication of the solid trials. Pt helped to feed self but was quite weak in his UEs and needed support. OF note, pt exhibited Min-Mod Belching during po trials. Recommend a Dysphagia level 3 diet w/ thin liquids w/ strict aspiration precautions; liquids via CUP; Pills in Puree - Whole. Reflux precautions. NSG to monitor pt during oral intake and if increased s/s of aspiration are noted then hold further po and alert MD/SLP.  SLP Visit Diagnosis: Dysphagia, oropharyngeal phase (R13.12)    Aspiration Risk  Mild aspiration risk(but reduced if following aspiration precautions)  Diet Recommendation  Dysphagia level 3 w/ Thin liquids - Strict aspiration precautions; Reflux precautions; feeding support at meals. Monitor for any s/s of aspiration noted during oral intake.   Medication Administration: Whole meds with puree(for safer swallowing)    Other  Recommendations Recommended  Consults: (Dietician f/u) Oral Care Recommendations: Oral care BID;Staff/trained caregiver to provide oral care Other Recommendations: (n/a at this time)   Follow up Recommendations (TBD)      Frequency and Duration min 3x week  2 weeks       Prognosis Prognosis for Safe Diet Advancement: Fair Barriers to Reach Goals: (Deconditioning)      Swallow Study   General Date of Onset: 04/26/18 HPI: Pt is a 77 y.o. male with a known history of multiple medical issues including CAD, CKD3, pacemaker, HTN, anemia and other comorbidities. Per EMS report, patient called 911 because he was hot and very short of breath. At the arrival to emergency room, patient continued to be in severe respiratory distress and combative, hence he was intubated. Chest x-ray shows patchy left-sided airspace opacity may reflect worsening pneumonia or asymmetric pulmonary edema. Underlying vascular congestion noted. Pt was extubated on 04/29/18 doing well respiratory wise but still anxious, agitated and restless. Pt was then reintubated on 04/30/18 d/t extreme agitation w/precedex off, tachy, freq PVCs, hypertensive, face red, unable to be redirecrted, hypoxic at 80 on 2 liters, could not tolerate NRB, Pt is now extubated again. He has been tolerating ice chips w/ NSG w/ no overt s/s of aspiration or decline in respiratory status noted per report.  Type of Study: Bedside Swallow Evaluation Previous Swallow Assessment: none reported Diet Prior to this Study: NPO(regular diet at home per pt) Temperature Spikes Noted: No(wbc 13.2) Respiratory Status: Room air History of Recent Intubation: Yes Length of Intubations (days): 7 days Date extubated: 05/05/18 Behavior/Cognition: Alert;Cooperative;Pleasant mood;Distractible;Requires cueing Oral Cavity Assessment: Within Functional Limits Oral Care Completed by SLP: Recent completion by staff Oral Cavity - Dentition: Adequate natural dentition Vision: Functional for  self-feeding Self-Feeding Abilities: Able to feed self;Needs set up;Needs assist(weak UEs) Patient Positioning: Upright in bed(needed positioning support) Baseline Vocal Quality: Low vocal intensity(min gravely, raspy) Volitional Cough: Strong;Congested Volitional Swallow: Able to elicit    Oral/Motor/Sensory Function Overall Oral Motor/Sensory Function: Within functional limits(grossly WFL w/ lingual/labial movements during po's)   Ice Chips Ice chips: Within functional limits Presentation: Spoon(fed; 3 trials)   Thin Liquid Thin Liquid: Impaired Presentation: Cup;Self Fed;Straw(assisted; ~4 ozs via Cup; 2 trials via Straw) Oral Phase Impairments: (none) Oral Phase Functional Implications: (none) Pharyngeal  Phase Impairments: Throat Clearing - Delayed;Cough - Delayed(x1 each w/ all of the trials via Cup) Other Comments: unsure if related to pt's pharyngeal sensitivity or d/t any pharyngeal phase dysphagia. NO decline in O2 sats or RR/HR noted during the po trials or during the throat clear/cough    Nectar Thick Nectar Thick Liquid: Not tested   Honey Thick Honey Thick Liquid: Not tested   Puree Puree: Within functional limits Presentation: Spoon(fed; 1 trials)   Solid     Solid: Impaired Presentation: Spoon(fed; 4 trials) Oral Phase Impairments: (min slower mastication effort) Oral Phase Functional Implications: Impaired mastication(min slower) Pharyngeal Phase Impairments: (none) Other Comments: pt needed min more time for oral phase management of solid trials        Orinda Kenner, MS, CCC-SLP [place]ie Minor 05/06/2018,4:23 PM

## 2018-05-07 ENCOUNTER — Inpatient Hospital Stay: Payer: Medicare Other | Admitting: Anesthesiology

## 2018-05-07 ENCOUNTER — Encounter: Payer: Self-pay | Admitting: Anesthesiology

## 2018-05-07 ENCOUNTER — Encounter
Admission: EM | Disposition: A | Discharge status: Skilled Nursing Facility | Payer: Self-pay | Source: Home / Self Care | Attending: Internal Medicine

## 2018-05-07 HISTORY — PX: ESOPHAGOGASTRODUODENOSCOPY (EGD) WITH PROPOFOL: SHX5813

## 2018-05-07 LAB — CALCIUM, IONIZED: CALCIUM, IONIZED, SERUM: 5.1 mg/dL (ref 4.5–5.6)

## 2018-05-07 LAB — COMPREHENSIVE METABOLIC PANEL
ALK PHOS: 47 U/L (ref 38–126)
ALT: 106 U/L — AB (ref 0–44)
AST: 137 U/L — AB (ref 15–41)
Albumin: 2.7 g/dL — ABNORMAL LOW (ref 3.5–5.0)
Anion gap: 7 (ref 5–15)
BILIRUBIN TOTAL: 0.7 mg/dL (ref 0.3–1.2)
BUN: 53 mg/dL — AB (ref 8–23)
CO2: 28 mmol/L (ref 22–32)
CREATININE: 1.68 mg/dL — AB (ref 0.61–1.24)
Calcium: 8.7 mg/dL — ABNORMAL LOW (ref 8.9–10.3)
Chloride: 107 mmol/L (ref 98–111)
GFR calc Af Amer: 44 mL/min — ABNORMAL LOW (ref >60)
GFR, EST NON AFRICAN AMERICAN: 38 mL/min — AB (ref >60)
Glucose, Bld: 113 mg/dL — ABNORMAL HIGH (ref 70–99)
Potassium: 3.9 mmol/L (ref 3.5–5.1)
Sodium: 142 mmol/L (ref 135–145)
TOTAL PROTEIN: 5.7 g/dL — AB (ref 6.5–8.1)

## 2018-05-07 LAB — CBC
HCT: 26.8 % — ABNORMAL LOW (ref 39.0–52.0)
Hemoglobin: 8.5 g/dL — ABNORMAL LOW (ref 13.0–17.0)
MCH: 29.3 pg (ref 26.0–34.0)
MCHC: 31.7 g/dL (ref 30.0–36.0)
MCV: 92.4 fL (ref 80.0–100.0)
PLATELETS: 208 10*3/uL (ref 150–400)
RBC: 2.9 MIL/uL — ABNORMAL LOW (ref 4.22–5.81)
RDW: 16.5 % — ABNORMAL HIGH (ref 11.5–15.5)
WBC: 13.6 10*3/uL — ABNORMAL HIGH (ref 4.0–10.5)
nRBC: 0 % (ref 0.0–0.2)

## 2018-05-07 LAB — PHOSPHORUS: Phosphorus: 2.9 mg/dL (ref 2.5–4.6)

## 2018-05-07 LAB — GLUCOSE, CAPILLARY
GLUCOSE-CAPILLARY: 104 mg/dL — AB (ref 70–99)
Glucose-Capillary: 113 mg/dL — ABNORMAL HIGH (ref 70–99)
Glucose-Capillary: 102 mg/dL — ABNORMAL HIGH (ref 70–99)

## 2018-05-07 LAB — MAGNESIUM: MAGNESIUM: 2.7 mg/dL — AB (ref 1.7–2.4)

## 2018-05-07 SURGERY — ESOPHAGOGASTRODUODENOSCOPY (EGD) WITH PROPOFOL
Anesthesia: General

## 2018-05-07 MED ORDER — PROPOFOL 10 MG/ML IV BOLUS
INTRAVENOUS | Status: AC
  Filled 2018-05-07: qty 20

## 2018-05-07 MED ORDER — SODIUM CHLORIDE 0.9 % IV SOLN
INTRAVENOUS | Status: DC
  Administered 2018-05-07: 12:00:00 via INTRAVENOUS

## 2018-05-07 MED ORDER — FUROSEMIDE 10 MG/ML IJ SOLN
40.0000 mg | Freq: Once | INTRAMUSCULAR | Status: AC
  Administered 2018-05-07: 40 mg via INTRAVENOUS
  Filled 2018-05-07: qty 4

## 2018-05-07 MED ORDER — OXYCODONE-ACETAMINOPHEN 5-325 MG PO TABS
1.0000 | ORAL_TABLET | Freq: Four times a day (QID) | ORAL | Status: DC | PRN
  Administered 2018-05-07 – 2018-05-11 (×4): 1 via ORAL
  Filled 2018-05-07 (×4): qty 1

## 2018-05-07 MED ORDER — METOPROLOL TARTRATE 50 MG PO TABS
50.0000 mg | ORAL_TABLET | Freq: Two times a day (BID) | ORAL | Status: DC
  Administered 2018-05-07: 50 mg via ORAL
  Filled 2018-05-07 (×2): qty 1

## 2018-05-07 MED ORDER — ZOLPIDEM TARTRATE 5 MG PO TABS
5.0000 mg | ORAL_TABLET | Freq: Every day | ORAL | Status: DC
  Administered 2018-05-07 – 2018-05-12 (×6): 5 mg via ORAL
  Filled 2018-05-07 (×6): qty 1

## 2018-05-07 MED ORDER — SODIUM CHLORIDE 0.9 % IV SOLN: INTRAVENOUS | Status: DC

## 2018-05-07 MED ORDER — POLYETHYLENE GLYCOL 3350 17 G PO PACK
17.0000 g | PACK | Freq: Every day | ORAL | Status: DC
  Administered 2018-05-07 – 2018-05-13 (×6): 17 g via ORAL
  Filled 2018-05-07 (×7): qty 1

## 2018-05-07 MED ORDER — FENTANYL CITRATE (PF) 100 MCG/2ML IJ SOLN
INTRAMUSCULAR | Status: AC
  Filled 2018-05-07: qty 2

## 2018-05-07 MED ORDER — PROPOFOL 10 MG/ML IV BOLUS
INTRAVENOUS | Status: DC | PRN
  Administered 2018-05-07: 50 mg via INTRAVENOUS
  Administered 2018-05-07: 30 mg via INTRAVENOUS

## 2018-05-07 MED ORDER — SENNOSIDES-DOCUSATE SODIUM 8.6-50 MG PO TABS
2.0000 | ORAL_TABLET | Freq: Two times a day (BID) | ORAL | Status: DC
  Administered 2018-05-07 – 2018-05-13 (×13): 2 via ORAL
  Filled 2018-05-07 (×13): qty 2

## 2018-05-07 NOTE — Progress Notes (Signed)
Condom cath applied a this time.  Patient given lasix IV. Spoke to Dr. Manuella Ghazi and due to patient weakness and desire for strict output, condom cath placed at this time. Family at bedside. Call bell in reach.

## 2018-05-07 NOTE — Care Management Important Message (Signed)
Copy of Medicare IM left in patient's room (out for procedure).

## 2018-05-07 NOTE — Progress Notes (Addendum)
SLP Cancellation Note  Patient Details Name: Matthew Zamora MRN: [id number] DOB: [birth year]   Cancelled treatment:       Reason Eval/Treat Not Completed: Patient at procedure or test/unavailable(chart reviewed; consulted NSG then met w/ pt/family). Pt is having an EGD today per GI. He is currently NPO. Spoke w/ pt and family and discussed aspiration precautions and the need for swallowing Pills in Puree(yogurt) and to use Small, Single sips when drinking liquids. Pt and family stated he has been doing this and tolerated the dinner meal last night well.  Recommend ongoing assessment and monitoring of pt's toleration of diet d/t his risk for aspiration post oral intubation x2 this admission. Pt and family were able to recall general aspiration precautions. Posted precautions in room. Encouraged pt/family to hold off on any po's post the EGD if too sleepy. ST services will f/u in the morning.     Orinda Kenner, [place], CCC-SLP [place]ese Andreatta 05/07/2018, 2:39 PM

## 2018-05-07 NOTE — Transfer of Care (Signed)
Immediate Anesthesia Transfer of Care Note  Patient: Matthew Zamora  Procedure(s) Performed: ESOPHAGOGASTRODUODENOSCOPY (EGD) WITH PROPOFOL (N/A )  Patient Location: PACU  Anesthesia Type:General  Level of Consciousness: sedated  Airway & Oxygen Therapy: Patient Spontanous Breathing and Patient connected to nasal cannula oxygen  Post-op Assessment: Report given to RN and Post -op Vital signs reviewed and stable  Post vital signs: Reviewed and stable  Last Vitals:  Vitals Value Taken Time  BP    Temp    Pulse 78 05/07/2018 12:30 PM  Resp 14 05/07/2018 12:30 PM  SpO2 98 % 05/07/2018 12:30 PM  Vitals shown include unvalidated device data.  Last Pain:  Vitals:   05/07/18 1146  TempSrc: Tympanic  PainSc:       Patients Stated Pain Goal: 0 (08/15/29 4388)  Complications: No apparent anesthesia complications

## 2018-05-07 NOTE — Progress Notes (Addendum)
[place] for Amiodarone DDIs Indication: A-fib  No Known Allergies  Patient Measurements: Height: 5\' 11"  (180.3 cm) Weight: 174 lb 4.8 oz (79.1 kg) IBW/kg (Calculated) : 75.3   Vital Signs: Temp: 97.3 F (36.3 C) (11/21 1225) Temp Source: Tympanic (11/21 1235) BP: 158/61 (11/21 1245) Pulse Rate: 80 (11/21 1245) Intake/Output from previous day: 11/20 0701 - 11/21 0700 In: 1510 [I.V.:1510] Out: 500 [Urine:500] Intake/Output from this shift: No intake/output data recorded.  Labs: Recent Labs    05/05/18 0435 05/05/18 0723  05/06/18 0534 05/06/18 1546 05/06/18 2244 05/07/18 0429  WBC 11.0* 11.2*  --  13.2*  --   --  13.6*  HGB 6.7* 6.7*   < > 8.1* 8.4* 8.3* 8.5*  HCT 21.1* 20.8*   < > 25.1* 25.6* 25.7* 26.8*  PLT 159 155  --  164  --   --  208  CREATININE 2.02*  --   --  1.77*  --   --  1.68*  MG  --   --   --  2.6*  --   --  2.7*  PHOS  --   --   --  4.7*  --   --  2.9  ALBUMIN  --   --   --  2.5*  --   --  2.7*  PROT  --   --   --  5.4*  --   --  5.7*  AST  --   --   --  151*  --   --  137*  ALT  --   --   --  88*  --   --  106*  ALKPHOS  --   --   --  44  --   --  47  BILITOT  --   --   --  0.8  --   --  0.7   < > = values in this interval not displayed.   Estimated Creatinine Clearance: 39.8 mL/min (A) (by C-G formula based on SCr of 1.68 mg/dL (H)).   Assessment/Plan:  QT interval 450 on ECG 11/10  Drug-drug interactions identified: Major Amiodarone-fentanyl: concurrent use may result in cardiac toxicity and increased risk of CNS/respiratory depression Amiodarone-ondansetron: concurrent use may increase risk of QT prolongation and torsades de pointes Amiodarone-quetiapine: concurrent use may result in increased risk of QT prolongation  Moderate Amiodarone-atorvastatin: concurrent use may increase risk of myopathy or rhabdomyolysis Amiodarone-clonazepam: concurrent use may increase confusion, slurred speech  (clonazepam toxicity)  Minor Amiodarone-budesonide: concurrent use may increase risk of development of Cushing's syndrome  Patient with intermittent fentanyl use and no recent ondansetron use. Quetiapine, clonazepam and budesonide currently scheduled BID. No changes at this time. Pharmacy will continue to monitor.   Thank you for allowing pharmacy to be a part of this patient's care.  Tawnya Crook, PharmD Pharmacy Resident  05/07/2018 3:54 PM

## 2018-05-07 NOTE — Op Note (Addendum)
[facility] Patient Name: Matthew Zamora Procedure Date: 05/07/2018 11:53 AM MRN: [id number] Account #: [id number] Date of Birth: 20-Feb-1941 Admit Type: Outpatient Age: 77 Room: [facility] ENDO ROOM 4 Gender: Male Note Status: Finalized Procedure:            Upper GI endoscopy Indications:          Suspected upper gastrointestinal bleeding in patient                        with unexplained iron deficiency anemia, Heme positive                        stool Providers:            [name]. Alice Reichert MD, MD Referring MD:         Irven Easterly. Kary Kos, MD (Referring MD) Medicines:            Propofol per Anesthesia Complications:        No immediate complications. Procedure:            Pre-Anesthesia Assessment:                       - The risks and benefits of the procedure and the                        sedation options and risks were discussed with the                        patient. All questions were answered and informed                        consent was obtained.                       - Patient identification and proposed procedure were                        verified prior to the procedure by the nurse. The                        procedure was verified in the procedure room.                       - ASA Grade Assessment: III - A patient with severe                        systemic disease.                       - After reviewing the risks and benefits, the patient                        was deemed in satisfactory condition to undergo the                        procedure.                       After obtaining informed consent, the endoscope was  passed under direct vision. Throughout the procedure,                        the patient's blood pressure, pulse, and oxygen                        saturations were monitored continuously. The Endoscope                        was introduced through the mouth, and advanced to the              third part of duodenum. The upper GI endoscopy was                        accomplished without difficulty. The patient tolerated                        the procedure well. Findings:      The examined esophagus was normal.      A 2 cm hiatal hernia was present.      There is no endoscopic evidence of erythema or inflammatory changes       suggestive of gastritis, inflammation or ulceration in the stomach.      The examined duodenum was normal.      The exam was otherwise without abnormality. Impression:           - Normal esophagus.                       - 2 cm hiatal hernia.                       - Normal examined duodenum.                       - The examination was otherwise normal.                       - No specimens collected. Recommendation:       - Return patient to hospital ward for ongoing care.                       - Resume previous diet. Procedure Code(s):    --- Professional ---                       [phone], Esophagogastroduodenoscopy, flexible, transoral;                        diagnostic, including collection of specimen(s) by                        brushing or washing, when performed (separate procedure) Diagnosis Code(s):    --- Professional ---                       R19.5, Other fecal abnormalities                       D50.9, Iron deficiency anemia, unspecified                       K44.9, Diaphragmatic hernia without obstruction or  gangrene CPT copyright 2018 American Medical Association. All rights reserved. The codes documented in this report are preliminary and upon coder review may  be revised to meet current compliance requirements. Efrain Sella MD, MD 05/07/2018 12:26:14 PM This report has been signed electronically. Number of Addenda: 0 Note Initiated On: 05/07/2018 11:53 AM      [facility]

## 2018-05-07 NOTE — Progress Notes (Signed)
This RN spoke to anesthesia.  Go ahead to give am beta blocker.  This RN gave report to GI for patient to go down for EGD.  Will give metoprolol, will hold other am PO meds after speaking to GI RN such as percocet, klonopin and seroquel.

## 2018-05-07 NOTE — Anesthesia Post-op Follow-up Note (Signed)
Anesthesia QCDR form completed.        

## 2018-05-07 NOTE — Anesthesia Preprocedure Evaluation (Signed)
Anesthesia Evaluation  Patient identified by MRN, date of birth, ID band Patient awake and Patient confused    Reviewed: Allergy & Precautions, H&P , NPO status , Patient's Chart, lab work & pertinent test results  Airway Mallampati: III  TM Distance: <3 FB Neck ROM: limited    Dental  (+) Chipped, Poor Dentition   Pulmonary neg shortness of breath, former smoker,           Cardiovascular Exercise Tolerance: Good hypertension, (-) angina+ CAD, + Cardiac Stents and +CHF  + dysrhythmias Atrial Fibrillation      Neuro/Psych negative neurological ROS  negative psych ROS   GI/Hepatic negative GI ROS, Neg liver ROS, neg GERD  ,  Endo/Other  negative endocrine ROS  Renal/GU negative Renal ROS  negative genitourinary   Musculoskeletal   Abdominal   Peds  Hematology negative hematology ROS (+)   Anesthesia Other Findings Patient is NPO appropriate and reports no nausea or vomiting today.   Past Medical History: 05/13/15: Abdominal aortic aneurysm (AAA) ([place])     Comment:  seen on ct scan 03/18/2001, 03/14/2009, 10/06/2014: Adenomatous colon polyp No date: Anemia 03/18/2001, 02/2014: Barrett esophagus No date: CAD (coronary artery disease) No date: Cataract cortical, senile No date: CHF (congestive heart failure) ([place]) No date: Chronic hoarseness No date: Exocrine pancreatic insufficiency No date: H. pylori infection No date: History of hepatitis No date: Hyperlipidemia No date: Hypertension 05/16/15: Liver cyst No date: Prostate CA [facility])  Past Surgical History: No date: CATARACT EXTRACTION 10/06/2014, 09/18/2004, 03/14/2009: COLONOSCOPY 10/06/2014, 03/18/2001, 03/14/2009: ESOPHAGOGASTRODUODENOSCOPY 08/26/1990: FLEXIBLE SIGMOIDOSCOPY No date: INSERTION OF ICD No date: PROSTATE SURGERY No date: TONSILLECTOMY  BMI    Body Mass Index:  24.31 kg/m      Reproductive/Obstetrics negative OB ROS                              Anesthesia Physical Anesthesia Plan  ASA: III  Anesthesia Plan: General   Post-op Pain Management:    Induction: Intravenous  PONV Risk Score and Plan: Propofol infusion and TIVA  Airway Management Planned: Natural Airway and Nasal Cannula  Additional Equipment:   Intra-op Plan:   Post-operative Plan:   Informed Consent: I have reviewed the patients History and Physical, chart, labs and discussed the procedure including the risks, benefits and alternatives for the proposed anesthesia with the patient or authorized representative who has indicated his/her understanding and acceptance.   Dental Advisory Given  Plan Discussed with: Anesthesiologist, CRNA and Surgeon  Anesthesia Plan Comments: (Patient and son Juanda Crumble by phone) consented for risks of anesthesia including but not limited to:  - adverse reactions to medications - risk of intubation if required - damage to teeth, lips or other oral mucosa - sore throat or hoarseness - Damage to heart, brain, lungs or loss of life  They voiced understanding.)        Anesthesia Quick Evaluation

## 2018-05-07 NOTE — Anesthesia Postprocedure Evaluation (Signed)
Anesthesia Post Note  Patient: KASPER MUDRICK  Procedure(s) Performed: ESOPHAGOGASTRODUODENOSCOPY (EGD) WITH PROPOFOL (N/A )  Patient location during evaluation: PACU Anesthesia Type: General Level of consciousness: awake and alert and oriented Pain management: pain level controlled Vital Signs Assessment: post-procedure vital signs reviewed and stable Respiratory status: spontaneous breathing Cardiovascular status: blood pressure returned to baseline Anesthetic complications: no     Last Vitals:  Vitals:   05/07/18 1235 05/07/18 1245  BP: 121/81 (!) 158/61  Pulse:  80  Resp:  16  Temp:    SpO2:      Last Pain:  Vitals:   05/07/18 1455  TempSrc:   PainSc: 0-No pain                 Falynn Ailey

## 2018-05-07 NOTE — Evaluation (Signed)
Physical Therapy Evaluation Patient Details Name: Matthew Zamora MRN: [id number] DOB: 12-13-1940 Today's Date: 05/07/2018   History of Present Illness  presented to ER secondary to worsening SOB; admitted with septic shock, acute/chronic respiratory failure related to PNA, pulmonary edema.  Intubated 11/10-11/13 and 11/14-11/19.  Hospital course additionally significant for periods of agitation (requiring sedation) and afib with RVR (cardiology following).  Clinical Impression  Upon evaluation, patient alert and oriented to basic information; follows commands and demonstrates good effort with mobility tasks.  Globally weak and deconditioned throughout all extremities, requiring active assist effort from therapist to complete full, against-gravity ROM.  Generally edematous UEs, L > R (propped/elevated end of session).  Currently requiring max assist +2 for bed mobility; min/mod assist for unsupported sitting balance (L posterior/lateral lean with fatigue); max assist +2 for lateral/scoot pivot transfer over level surfaces.  Unsafe/unable to attempt standing at this time due to profound weakness to bilat LEs.  Will continue to assess/progress as appropriate in subsequent session. Of note, patient noted with HR elevation to 140s with unsupported sitting and transfer; denied chest pain or pressure.  Recovers to baseline (110s) with rest period.  RN informed/aware. Would benefit from skilled PT to address above deficits and promote optimal return to PLOF; recommend transition to STR upon discharge from acute hospitalization.      Follow Up Recommendations SNF    Equipment Recommendations       Recommendations for Other Services       Precautions / Restrictions Precautions Precautions: Fall;ICD/Pacemaker Restrictions Weight Bearing Restrictions: No      Mobility  Bed Mobility Overal bed mobility: Needs Assistance Bed Mobility: Supine to Sit     Supine to sit: Max assist;+2 for  physical assistance     General bed mobility comments: assist for movement of all extremities, truncal elevation and scooting to edge of bed  Transfers Overall transfer level: Needs assistance   Transfers: Lateral/Scoot Transfers          Lateral/Scoot Transfers: Max assist;+2 physical assistance General transfer comment: assist for trunk control with forward trunk lean, lift off and lateral movement  Ambulation/Gait             General Gait Details: unsafe/unable  Stairs            Wheelchair Mobility    Modified Rankin (Stroke Patients Only)       Balance Overall balance assessment: Needs assistance Sitting-balance support: No upper extremity supported;Feet supported Sitting balance-Leahy Scale: Poor Sitting balance - Comments: initially, requiring mod/max assist to attain position; improved to min assist with weight shifting and accommodation to position. Drifts L posterior/lateral with fatigue       Standing balance comment: unsafe/unable                             Pertinent Vitals/Pain Pain Assessment: Faces Faces Pain Scale: Hurts little more Pain Location: L LE Pain Descriptors / Indicators: Aching Pain Intervention(s): Limited activity within patient's tolerance;Monitored during session;Repositioned    Home Living Family/patient expects to be discharged to:: Private residence Living Arrangements: Alone Available Help at Discharge: Family;Available PRN/intermittently Type of Home: House Home Access: Stairs to enter Entrance Stairs-Rails: Right Entrance Stairs-Number of Steps: 5 Home Layout: Two level;Able to live on main level with bedroom/bathroom Home Equipment: None      Prior Function Level of Independence: Independent         Comments: Indep with ADLs, household and community  mobilization without assist device; + driving; denies fall history.     Hand Dominance   Dominant Hand: Right    Extremity/Trunk  Assessment   Upper Extremity Assessment Upper Extremity Assessment: Generalized weakness(grossly 3-/5, generally edematous (L > R))    Lower Extremity Assessment Lower Extremity Assessment: Generalized weakness(grossly 3-/5 throughout; globally weak and deconditioned.  Bilat ankles to neutral passively)       Communication   Communication: No difficulties  Cognition Arousal/Alertness: Awake/alert Behavior During Therapy: WFL for tasks assessed/performed Overall Cognitive Status: No family/caregiver present to determine baseline cognitive functioning                                 General Comments: oriented to basic information, follow commands, maintains topic of conversation      General Comments      Exercises Other Exercises Other Exercises: Unsupported sitting edge of bed, min/mod assist, worked on postural control and weight shifting activities to increase awareness of midline and dynamic balance reactions.  With trianing, able to maintain balance with min assist   Assessment/Plan    PT Assessment    PT Problem List Decreased strength;Decreased activity tolerance;Decreased cognition;Decreased mobility;Decreased safety awareness;Cardiopulmonary status limiting activity;Decreased range of motion;Decreased balance;Decreased coordination;Decreased knowledge of use of DME;Decreased knowledge of precautions       PT Treatment Interventions DME instruction;Stair training;Therapeutic activities;Cognitive remediation;Balance training;Gait training;Functional mobility training;Therapeutic exercise;Patient/family education    PT Goals (Current goals can be found in the Care Plan section)  Acute Rehab PT Goals Patient Stated Goal: to get OOB to chair PT Goal Formulation: With patient Time For Goal Achievement: 05/21/18 Potential to Achieve Goals: Good Additional Goals Additional Goal #1: Assess and establish goals for OOB activities as appropriate.    Frequency  Min 2X/week   Barriers to discharge        Co-evaluation               AM-PAC PT "6 Clicks" Daily Activity  Outcome Measure Difficulty turning over in bed (including adjusting bedclothes, sheets and blankets)?: Unable Difficulty moving from lying on back to sitting on the side of the bed? : Unable Difficulty sitting down on and standing up from a chair with arms (e.g., wheelchair, bedside commode, etc,.)?: Unable Help needed moving to and from a bed to chair (including a wheelchair)?: Total Help needed walking in hospital room?: Total Help needed climbing 3-5 steps with a railing? : Total 6 Click Score: 6    End of Session   Activity Tolerance: Patient tolerated treatment well Patient left: in chair;with call bell/phone within reach;with chair alarm set;with family/visitor present Nurse Communication: Mobility status(recommend use of lift for return to bed, RN/CNA informed and aware; sling placed in room) PT Visit Diagnosis: Muscle weakness (generalized) (M62.81);Difficulty in walking, not elsewhere classified (R26.2)    Time: 1610-9604 PT Time Calculation (min) (ACUTE ONLY): 30 min   Charges:   PT Evaluation $PT Eval Moderate Complexity: 1 Mod PT Treatments $Therapeutic Activity: 8-22 mins   Karalynn Cottone H. Owens Shark, PT, DPT, NCS 05/07/18, 9:59 PM [phone]

## 2018-05-07 NOTE — Progress Notes (Signed)
Patient off floor to endoscopy for EGD. Family at bedside when transferred.

## 2018-05-07 NOTE — Progress Notes (Signed)
Progress Note  Patient Name: Matthew Zamora Date of Encounter: 05/07/2018  Primary Cardiologist: [facility]  Subjective   Converted back to normal sinus rhythm yesterday Continues on amiodarone infusion EGD today, no bleeding 2 cm hiatal hernia --- Converting back to atrial fibrillation on the floor,  asymptomatic  Inpatient Medications    Scheduled Meds: . aspirin  81 mg Oral Daily  . atorvastatin  40 mg Oral Daily  . budesonide (PULMICORT) nebulizer solution  0.5 mg Nebulization BID  . clonazepam  0.25 mg Oral BID  . feeding supplement (ENSURE ENLIVE)  237 mL Oral BID BM  . insulin aspart  0-9 Units Subcutaneous TID WC & HS  . ipratropium-albuterol  3 mL Nebulization BID  . lipase/protease/amylase  12,000 Units Oral TID AC  . mouth rinse  15 mL Mouth Rinse BID  . metoprolol tartrate  50 mg Oral BID  . multivitamin with minerals  1 tablet Oral Daily  . pantoprazole (PROTONIX) IV  40 mg Intravenous Q12H  . polyethylene glycol  17 g Oral Daily  . QUEtiapine  100 mg Oral BID  . senna-docusate  2 tablet Oral BID  . sodium chloride flush  10-40 mL Intracatheter Q12H  . zolpidem  5 mg Oral QHS   Continuous Infusions: . amiodarone 60 mg/hr (05/07/18 1031)   PRN Meds: acetaminophen **OR** acetaminophen, bisacodyl, bisacodyl, fentaNYL (SUBLIMAZE) injection, ondansetron **OR** ondansetron (ZOFRAN) IV, oxyCODONE-acetaminophen, sodium chloride flush   Vital Signs    Vitals:   05/07/18 1146 05/07/18 1225 05/07/18 1235 05/07/18 1245  BP: (!) 165/60 (!) 144/62 121/81 (!) 158/61  Pulse: 82 76  80  Resp: 16   16  Temp: 98 F (36.7 C) (!) 97.3 F (36.3 C)    TempSrc: Tympanic Tympanic Tympanic   SpO2: 97% 99%    Weight:      Height:        Intake/Output Summary (Last 24 hours) at 05/07/2018 1614 Last data filed at 05/07/2018 1500 Gross per 24 hour  Intake 606.19 ml  Output 1400 ml  Net -793.81 ml   Filed Weights   05/06/18 0500 05/06/18 1327 05/07/18 0327  Weight:  81.2 kg 84.2 kg 79.1 kg    Telemetry    Normal sinus rhythm yesterday overnight converting to atrial fibrillation around 3:00 pm 05/08/2018  ECG    No new tracing. - Personally Reviewed  Physical Exam   Constitutional:  oriented to person, place, and time. No distress.  HENT:  Head: Normocephalic and atraumatic.  Eyes:  no discharge. No scleral icterus.  Neck: Normal range of motion. Neck supple. No JVD present.  Cardiovascular: Irregularly irregular, normal heart sounds and intact distal pulses. Exam reveals no gallop and no friction rub. No edema No murmur heard. Pulmonary/Chest: Mildly decreased breath sounds throughout, scattered Rales  Abdominal: Soft.  no distension.  no tenderness.  Musculoskeletal: Normal range of motion.  no  tenderness or deformity.  Neurological:  normal muscle tone. Coordination normal. No atrophy Skin: Skin is warm and dry. No rash noted. not diaphoretic.  Psychiatric: Mild confusion   Labs    Chemistry Recent Labs  Lab 05/05/18 0435 05/06/18 0534 05/07/18 0429  NA 142 145 142  K 4.1 4.8 3.9  CL 101 106 107  CO2 32 29 28  GLUCOSE 163* 134* 113*  BUN 81* 65* 53*  CREATININE 2.02* 1.77* 1.68*  CALCIUM 8.1* 8.3* 8.7*  PROT  --  5.4* 5.7*  ALBUMIN  --  2.5* 2.7*  AST  --  151* 137*  ALT  --  88* 106*  ALKPHOS  --  44 47  BILITOT  --  0.8 0.7  GFRNONAA 30* 36* 38*  GFRAA 35* 41* 44*  ANIONGAP 9 10 7      Hematology Recent Labs  Lab 05/05/18 0723  05/06/18 0534 05/06/18 1546 05/06/18 2244 05/07/18 0429  WBC 11.2*  --  13.2*  --   --  13.6*  RBC 2.27*  --  2.78*  --   --  2.90*  HGB 6.7*   < > 8.1* 8.4* 8.3* 8.5*  HCT 20.8*   < > 25.1* 25.6* 25.7* 26.8*  MCV 91.6  --  90.3  --   --  92.4  MCH 29.5  --  29.1  --   --  29.3  MCHC 32.2  --  32.3  --   --  31.7  RDW 15.9*  --  16.3*  --   --  16.5*  PLT 155  --  164  --   --  208   < > = values in this interval not displayed.    Cardiac EnzymesNo results for input(s):  TROPONINI in the last 168 hours. No results for input(s): TROPIPOC in the last 168 hours.   BNP No results for input(s): BNP, PROBNP in the last 168 hours.   DDimer No results for input(s): DDIMER in the last 168 hours.   Radiology    Dg Chest Port 1 View  Result Date: 05/06/2018 CLINICAL DATA:  Pneumonia. EXAM: PORTABLE CHEST 1 VIEW COMPARISON:  Radiograph of May 05, 2018. FINDINGS: Stable cardiomediastinal silhouette. Left-sided pacemaker is unchanged in position. Right-sided PICC line is unchanged in position. Endotracheal and nasogastric tubes have been removed. No pneumothorax is noted. Mild bibasilar subsegmental atelectasis is noted with possible small left pleural effusion. Bony thorax is unremarkable. IMPRESSION: Endotracheal and nasogastric tubes have been removed. Mild bibasilar subsegmental atelectasis is noted with possible small left pleural effusion. Electronically Signed   By: Marijo Conception, M.D.   On: 05/06/2018 07:22    Cardiac Studies   Echo 04/27/2018: Study Conclusions  - Left ventricle: The cavity size was normal. There was moderate focal basal hypertrophy of the septum. Systolic function was mildly to moderately reduced. The estimated ejection fraction was in the range of 40% to 45%. Images were inadequate for LV wall motion assessment. The study is not technically sufficient to allow evaluation of LV diastolic function. - Mitral valve: There was mild regurgitation. - Left atrium: The atrium was moderately dilated. - Right atrium: The atrium was mildly dilated. - Pulmonary arteries: Systolic pressure was moderately increased. PA peak pressure: 50 mm Hg (S). - Inferior vena cava: The vessel was dilated. The respirophasic diameter changes were blunted (<50%), consistent with elevated central venous pressure.  Patient Profile     77 y.o. male with history of coronary artery disease, chronic systolic heart failure, ventricular  tachycardia status post ICD, and chronic kidney disease stage III,whowas admitted due to acute respiratory failure thought to be due to pneumonia and developed intermittent episodes of atrial fibrillation with rapid ventricular response.  Assessment & Plan   Acute respiratory failure with hypoxia and hypercapnia Stable respiratory status, on room air  Atrial fibrillation with rapid ventricular response  Paroxysmal atrial fibrillation  Would continue amiodarone infusion,  --- I will increase metoprolol up to 50 mg twice daily and effort to restore normal sinus rhythm Currently not a good candidate for anticoagulation Outpatient colonoscopy scheduled per GI  Coronary artery disease Demand ischemia, no plan for ischemic work-up at this time On low-dose aspirin  Acute on chronic systolic heart failure Lasix on hold Ejection fraction 40 to 45%, was obtained when he was normal sinus rhythm April 26, 2018 --Increasing beta-blocker, will consider changing to metoprolol succinate once finished titrating and maintaining normal sinus rhythm  Acute on chronic kidney disease stage III Stable creatinine 1.68  Anemia Received transfusion,  Stable 8.5 We will continue to hold anticoagulation    Total encounter time more than 25 minutes  Greater than 50% was spent in counseling and coordination of care with the patient   For questions or updates, please contact [facility] Please consult [email or web address] for contact info under [facility].    Signed, Ida Rogue, MD  05/07/2018, 4:14 PM

## 2018-05-07 NOTE — Progress Notes (Signed)
Patient ID: Matthew Zamora, male   DOB: [birth year], 77 y.o.   MRN: [id number]  [facility] [employer] PROGRESS NOTE  SANFORD LINDBLAD XHB:[id number] DOB: 1941/06/09 DOA: 04/26/2018 PCP: Maryland Pink, MD  HPI/Subjective Very edematous, would like to ambulate, daughter at bedside Objective: Vitals:   05/07/18 0752 05/07/18 0857  BP: (!) 157/64   Pulse: 76   Resp: 20   Temp: 99.4 F (37.4 C)   SpO2: 96% 94%    Filed Weights   05/06/18 0500 05/06/18 1327 05/07/18 0327  Weight: 81.2 kg 84.2 kg 79.1 kg    ROS: Review of Systems  Constitutional: Positive for malaise/fatigue. Negative for chills, fever and weight loss.  HENT: Negative for nosebleeds and sore throat.   Eyes: Negative for blurred vision.  Respiratory: Negative for cough, shortness of breath and wheezing.   Cardiovascular: Positive for leg swelling. Negative for chest pain, orthopnea and PND.  Gastrointestinal: Negative for abdominal pain, constipation, diarrhea, heartburn, nausea and vomiting.  Genitourinary: Negative for dysuria and urgency.  Musculoskeletal: Negative for back pain.  Skin: Negative for rash.  Neurological: Negative for dizziness, speech change, focal weakness and headaches.  Endo/Heme/Allergies: Does not bruise/bleed easily.  Psychiatric/Behavioral: Negative for depression.   Exam: Physical Exam  HENT:  Nose: No mucosal edema.  Mouth/Throat: No oropharyngeal exudate or posterior oropharyngeal edema.  Eyes: Conjunctivae and lids are normal.  Neck: No JVD present. Carotid bruit is not present. No edema present. No thyroid mass and no thyromegaly present.  Cardiovascular: Regular rhythm, S1 normal, S2 normal and normal heart sounds. Exam reveals no gallop.  No murmur heard. Pulses:      Dorsalis pedis pulses are 2+ on the right side, and 2+ on the left side.  Respiratory: No respiratory distress. He has decreased breath sounds in the right middle field, the right lower field, the left middle  field and the left lower field. He has no wheezes. He has no rhonchi. He has no rales.  GI: Soft. Bowel sounds are normal. There is no tenderness.  Musculoskeletal: He exhibits edema.       Right ankle: He exhibits no swelling.       Left ankle: He exhibits no swelling.  Lymphadenopathy:    He has no cervical adenopathy.  Neurological: He is alert.  Skin: Skin is warm. No rash noted. Nails show no clubbing.    Data Reviewed: Basic Metabolic Panel: Recent Labs  Lab 05/02/18 0201 05/02/18 1655 05/03/18 0444 05/04/18 0558 05/04/18 0600 05/05/18 0435 05/06/18 0534 05/07/18 0429  NA 145  --  142  --  143 142 145 142  K 3.9  --  3.8  --  4.3 4.1 4.8 3.9  CL 103  --  99  --  99 101 106 107  CO2 27  --  29  --  33* 32 29 28  GLUCOSE 151*  --  210*  --  171* 163* 134* 113*  BUN 52*  --  57*  --  70* 81* 65* 53*  CREATININE 1.96*  --  1.89*  --  1.85* 2.02* 1.77* 1.68*  CALCIUM 8.8*  --  8.5*  --  8.6* 8.1* 8.3* 8.7*  MG 2.3 2.1  --  2.5*  --   --  2.6* 2.7*  PHOS 3.5 3.9  --  5.3*  --   --  4.7* 2.9   Liver Function Tests: Recent Labs  Lab 05/06/18 0534 05/07/18 0429  AST 151* 137*  ALT 88* 106*  ALKPHOS  44 47  BILITOT 0.8 0.7  PROT 5.4* 5.7*  ALBUMIN 2.5* 2.7*   CBC: Recent Labs  Lab 05/04/18 0558 05/05/18 0435 05/05/18 0723 05/05/18 2047 05/06/18 0534 05/06/18 1546 05/06/18 2244 05/07/18 0429  WBC 17.5* 11.0* 11.2*  --  13.2*  --   --  13.6*  NEUTROABS  --   --  9.1*  --  11.7*  --   --   --   HGB 8.9* 6.7* 6.7* 9.0* 8.1* 8.4* 8.3* 8.5*  HCT 28.0* 21.1* 20.8* 27.7* 25.1* 25.6* 25.7* 26.8*  MCV 91.2 91.7 91.6  --  90.3  --   --  92.4  PLT 226 159 155  --  164  --   --  208   Cardiac Enzymes: No results for input(s): CKTOTAL, CKMB, CKMBINDEX, TROPONINI in the last 168 hours. BNP (last 3 results) Recent Labs    04/26/18 0030 04/27/18 1054 04/29/18 0302  BNP 717.0* 799.0* 889.0*    CBG: Recent Labs  Lab 05/06/18 0414 05/06/18 0725 05/06/18 1612  05/06/18 2102 05/07/18 0753  GLUCAP 120* 120* 129* 108* 104*    Recent Results (from the past 240 hour(s))  CULTURE, BLOOD (ROUTINE X 2) w Reflex to ID Panel     Status: None   Collection Time: 04/28/18 11:52 AM  Result Value Ref Range Status   Specimen Description BLOOD LEFT HAND  Final   Special Requests   Final    BOTTLES DRAWN AEROBIC AND ANAEROBIC Blood Culture results may not be optimal due to an inadequate volume of blood received in culture bottles   Culture   Final    NO GROWTH 5 DAYS Performed at [facility], [address] [place], [place] [address]    Report Status 05/03/2018 FINAL  Final  Culture, blood (Routine X 2) w Reflex to ID Panel     Status: None   Collection Time: 04/28/18  1:47 PM  Result Value Ref Range Status   Specimen Description BLOOD BLOOD LEFT HAND  Final   Special Requests   Final    BOTTLES DRAWN AEROBIC AND ANAEROBIC Blood Culture adequate volume   Culture   Final    NO GROWTH 5 DAYS Performed at [facility], [address] [place], [place] [address]    Report Status 05/03/2018 FINAL  Final     Studies: Dg Chest Port 1 View  Result Date: 05/06/2018 CLINICAL DATA:  Pneumonia. EXAM: PORTABLE CHEST 1 VIEW COMPARISON:  Radiograph of May 05, 2018. FINDINGS: Stable cardiomediastinal silhouette. Left-sided pacemaker is unchanged in position. Right-sided PICC line is unchanged in position. Endotracheal and nasogastric tubes have been removed. No pneumothorax is noted. Mild bibasilar subsegmental atelectasis is noted with possible small left pleural effusion. Bony thorax is unremarkable. IMPRESSION: Endotracheal and nasogastric tubes have been removed. Mild bibasilar subsegmental atelectasis is noted with possible small left pleural effusion. Electronically Signed   By: Marijo Conception, M.D.   On: 05/06/2018 07:22    Scheduled Meds: . aspirin  81 mg Oral Daily  . atorvastatin  40 mg Oral Daily  . budesonide (PULMICORT)  nebulizer solution  0.5 mg Nebulization BID  . clonazepam  0.25 mg Oral BID  . feeding supplement (ENSURE ENLIVE)  237 mL Oral BID BM  . furosemide  40 mg Intravenous Once  . insulin aspart  0-9 Units Subcutaneous TID WC & HS  . ipratropium-albuterol  3 mL Nebulization BID  . lipase/protease/amylase  12,000 Units Oral TID AC  .  mouth rinse  15 mL Mouth Rinse BID  . metoprolol tartrate  25 mg Oral BID  . multivitamin with minerals  1 tablet Oral Daily  . pantoprazole (PROTONIX) IV  40 mg Intravenous Q12H  . polyethylene glycol  17 g Oral Daily  . QUEtiapine  100 mg Oral BID  . senna-docusate  2 tablet Oral BID  . sodium chloride flush  10-40 mL Intracatheter Q12H  . zolpidem  5 mg Oral QHS   Continuous Infusions: . sodium chloride    . amiodarone 60 mg/hr (05/07/18 1031)    Assessment/Plan:  1. Septic shock secondary to pneumonia: completed Abx course.  No fever 2. Acute hypoxic respiratory failure with respiratory acidosis: resolved. On [place] 3. Atrial fibrillation with rapid ventricular response.  Echocardiogram showed an EF of 40 to 45%.  Continue amiodarone and metoprolol.   - Not a good candidate for anticoagulation given severe anemia 4. Acute metabolic encephalopathy.  CT scan negative. Now resolved. 5. Elevated troponin secondary to demand ischemia with septic shock.  On aspirin, plavix 6. Acute kidney injury on chronic kidney disease stage III.  Creatinine 1.68 7. Pancreatic insufficiency on pancreatic enzymes 8. Anemia of chronic disease.  Hemoglobin down to 8.5 9. Hyperlipidemia unspecified on Lipitor 10. History of prostate cancer 11. 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 Secondary hyperparathyroidism: mgmt per Nephro 14. Anasarca: He is +10 L, will continue Diuretics as able    Code Status: Full code    Family Communication: Spoke with family at bedside today Disposition Plan:  Home with home health Consultants:  Cardiology  Nephrology  Antibiotics: - done  Time spent: 35 minutes.  Case discussed with nursing staff  [facility]

## 2018-05-08 ENCOUNTER — Encounter: Payer: Self-pay | Admitting: Internal Medicine

## 2018-05-08 LAB — CALCIUM, IONIZED: CALCIUM, IONIZED, SERUM: 5.3 mg/dL (ref 4.5–5.6)

## 2018-05-08 LAB — BASIC METABOLIC PANEL
ANION GAP: 8 (ref 5–15)
BUN: 43 mg/dL — AB (ref 8–23)
CHLORIDE: 106 mmol/L (ref 98–111)
CO2: 26 mmol/L (ref 22–32)
Calcium: 8.4 mg/dL — ABNORMAL LOW (ref 8.9–10.3)
Creatinine, Ser: 1.56 mg/dL — ABNORMAL HIGH (ref 0.61–1.24)
GFR calc Af Amer: 48 mL/min — ABNORMAL LOW (ref >60)
GFR calc non Af Amer: 41 mL/min — ABNORMAL LOW (ref >60)
GLUCOSE: 196 mg/dL — AB (ref 70–99)
Potassium: 3.8 mmol/L (ref 3.5–5.1)
Sodium: 140 mmol/L (ref 135–145)

## 2018-05-08 LAB — GLUCOSE, CAPILLARY
Glucose-Capillary: 97 mg/dL (ref 70–99)
Glucose-Capillary: 118 mg/dL — ABNORMAL HIGH (ref 70–99)
Glucose-Capillary: 101 mg/dL — ABNORMAL HIGH (ref 70–99)
Glucose-Capillary: 101 mg/dL — ABNORMAL HIGH (ref 70–99)

## 2018-05-08 LAB — CBC
HEMATOCRIT: 26.2 % — AB (ref 39.0–52.0)
HEMOGLOBIN: 8.1 g/dL — AB (ref 13.0–17.0)
MCH: 28.9 pg (ref 26.0–34.0)
MCHC: 30.9 g/dL (ref 30.0–36.0)
MCV: 93.6 fL (ref 80.0–100.0)
Platelets: 209 10*3/uL (ref 150–400)
RBC: 2.8 MIL/uL — ABNORMAL LOW (ref 4.22–5.81)
RDW: 16 % — ABNORMAL HIGH (ref 11.5–15.5)
WBC: 13.3 10*3/uL — ABNORMAL HIGH (ref 4.0–10.5)
nRBC: 0 % (ref 0.0–0.2)

## 2018-05-08 MED ORDER — METOPROLOL TARTRATE 50 MG PO TABS
100.0000 mg | ORAL_TABLET | Freq: Two times a day (BID) | ORAL | Status: DC
  Administered 2018-05-08 – 2018-05-13 (×10): 100 mg via ORAL
  Filled 2018-05-08 (×10): qty 2

## 2018-05-08 MED ORDER — FUROSEMIDE 40 MG PO TABS
40.0000 mg | ORAL_TABLET | Freq: Every day | ORAL | Status: DC
  Administered 2018-05-08 – 2018-05-13 (×6): 40 mg via ORAL
  Filled 2018-05-08 (×6): qty 1

## 2018-05-08 MED ORDER — AMIODARONE HCL 200 MG PO TABS
400.0000 mg | ORAL_TABLET | Freq: Two times a day (BID) | ORAL | Status: DC
  Administered 2018-05-08 – 2018-05-11 (×8): 400 mg via ORAL
  Filled 2018-05-08 (×8): qty 2

## 2018-05-08 NOTE — Progress Notes (Signed)
Progress Note  Patient Name: Matthew Zamora Date of Encounter: 05/08/2018  Primary Cardiologist: [facility]  Subjective   SOB improving, not yet at goal. In NSR with PACs this morning. No chest pain. Renal function 1.68-->1.56. On room air. HGB remains low at 8.1. Got to recliner on 11/21 without issues. Continues to note weakness.   Inpatient Medications    Scheduled Meds: . amiodarone  400 mg Oral BID  . aspirin  81 mg Oral Daily  . atorvastatin  40 mg Oral Daily  . budesonide (PULMICORT) nebulizer solution  0.5 mg Nebulization BID  . clonazepam  0.25 mg Oral BID  . feeding supplement (ENSURE ENLIVE)  237 mL Oral BID BM  . insulin aspart  0-9 Units Subcutaneous TID WC & HS  . ipratropium-albuterol  3 mL Nebulization BID  . lipase/protease/amylase  12,000 Units Oral TID AC  . mouth rinse  15 mL Mouth Rinse BID  . metoprolol tartrate  100 mg Oral BID  . multivitamin with minerals  1 tablet Oral Daily  . pantoprazole (PROTONIX) IV  40 mg Intravenous Q12H  . polyethylene glycol  17 g Oral Daily  . QUEtiapine  100 mg Oral BID  . senna-docusate  2 tablet Oral BID  . sodium chloride flush  10-40 mL Intracatheter Q12H  . zolpidem  5 mg Oral QHS   Continuous Infusions:  PRN Meds: acetaminophen **OR** acetaminophen, bisacodyl, bisacodyl, fentaNYL (SUBLIMAZE) injection, ondansetron **OR** ondansetron (ZOFRAN) IV, oxyCODONE-acetaminophen, sodium chloride flush   Vital Signs    Vitals:   05/07/18 2008 05/07/18 2028 05/08/18 0541 05/08/18 0801  BP: (!) 142/87  (!) 151/66 (!) 146/65  Pulse: (!) 101  78 75  Resp:    18  Temp:   98.4 F (36.9 C) 98.4 F (36.9 C)  TempSrc:   Oral Oral  SpO2: 96% 95% 95% 96%  Weight:   77.8 kg   Height:        Intake/Output Summary (Last 24 hours) at 05/08/2018 0806 Last data filed at 05/08/2018 0552 Gross per 24 hour  Intake 156.27 ml  Output 1825 ml  Net -1668.73 ml   Filed Weights   05/06/18 1327 05/07/18 0327 05/08/18 0541    Weight: 84.2 kg 79.1 kg 77.8 kg    Telemetry    NSR with PACs, 70s bpm - Personally Reviewed  ECG    n/a - Personally Reviewed  Physical Exam   GEN: No acute distress.   Neck: No JVD. Cardiac: RRR, no murmurs, rubs, or gallops.  Respiratory: Clear to auscultation bilaterally.  GI: Soft, nontender, non-distended.   MS: No edema; No deformity. Neuro:  Alert and oriented x 3; Nonfocal.  Psych: Normal affect.  Labs    Chemistry Recent Labs  Lab 05/06/18 0534 05/07/18 0429 05/08/18 0551  NA 145 142 140  K 4.8 3.9 3.8  CL 106 107 106  CO2 29 28 26   GLUCOSE 134* 113* 196*  BUN 65* 53* 43*  CREATININE 1.77* 1.68* 1.56*  CALCIUM 8.3* 8.7* 8.4*  PROT 5.4* 5.7*  --   ALBUMIN 2.5* 2.7*  --   AST 151* 137*  --   ALT 88* 106*  --   ALKPHOS 44 47  --   BILITOT 0.8 0.7  --   GFRNONAA 36* 38* 41*  GFRAA 41* 44* 48*  ANIONGAP 10 7 8      Hematology Recent Labs  Lab 05/06/18 0534  05/06/18 2244 05/07/18 0429 05/08/18 0551  WBC 13.2*  --   --  13.6* 13.3*  RBC 2.78*  --   --  2.90* 2.80*  HGB 8.1*   < > 8.3* 8.5* 8.1*  HCT 25.1*   < > 25.7* 26.8* 26.2*  MCV 90.3  --   --  92.4 93.6  MCH 29.1  --   --  29.3 28.9  MCHC 32.3  --   --  31.7 30.9  RDW 16.3*  --   --  16.5* 16.0*  PLT 164  --   --  208 209   < > = values in this interval not displayed.    Cardiac EnzymesNo results for input(s): TROPONINI in the last 168 hours. No results for input(s): TROPIPOC in the last 168 hours.   BNPNo results for input(s): BNP, PROBNP in the last 168 hours.   DDimer No results for input(s): DDIMER in the last 168 hours.   Radiology    No results found.  Cardiac Studies   Echo 04/27/2018: Study Conclusions  - Left ventricle: The cavity size was normal. There was moderate focal basal hypertrophy of the septum. Systolic function was mildly to moderately reduced. The estimated ejection fraction was in the range of 40% to 45%. Images were inadequate for LV  wall motion assessment. The study is not technically sufficient to allow evaluation of LV diastolic function. - Mitral valve: There was mild regurgitation. - Left atrium: The atrium was moderately dilated. - Right atrium: The atrium was mildly dilated. - Pulmonary arteries: Systolic pressure was moderately increased. PA peak pressure: 50 mm Hg (S). - Inferior vena cava: The vessel was dilated. The respirophasic diameter changes were blunted (<50%), consistent with elevated central venous pressure.  Patient Profile     77 y.o. male with history of coronary artery disease, chronic systolic heart failure, ventricular tachycardia status post ICD, and chronic kidney disease stage III,whowas admitted due to acute respiratory failure thought to be due to pneumonia and developed intermittent episodes of atrial fibrillation with rapid ventricular response.  Assessment & Plan    1. Acute respiratory failure with hypoxia and hypercapnia: -On room air -Stable  2. PAF with RVR: -Currently in sinus rhythm with heart rates in the 70s bpm with PACs -Transition from IV amiodarone to po amiodarone 400 gm bid x 1 week, then 200 mg bid x 1 week, then 200 mg daily thereafter -Increase metoprolol to 100 mg bid -Not on [place] given anemia with GI bleed, s/p EGD and planning for outpatient colonoscopy  -Ambulate  3. CAD with elevated troponin: -Felt to be demand ischemia -Echo as above -Increase beta blocker as above with plans to transition to Toprol once it is clear he is maintaining sinus rhythm -ASA -Lipitor -Consider outpatient ischemic evaluation   4. Acute on chronic HFrEF: -Transition to PO Lasix 40 mg daily with KCl repletion  -Metoprolol as above -no ACEi/ARB/spiro currently given AKI -Outpatient ischemic evaluation as above  5. Acute on CKD stage III: -Improving -Monitor   6. Anemia: -HGB low, though stable -Not on OAC as above -Recommend to maintain HGB > 8.5  For  questions or updates, please contact [place] Please consult [email or web address] for contact info under Cardiology/STEMI.    Signed, Christell Faith, PA-C [facility] Pager: [phone] 05/08/2018, 8:06 AM

## 2018-05-08 NOTE — Progress Notes (Signed)
[facility] Inpatient Progress Note  Subjective: Patient seen for f/u anemia. No complaints of over gastrointestinal bleeding.  Objective: Vital signs in last 24 hours: Temp:  [98.4 F (36.9 C)] 98.4 F (36.9 C) (11/22 0801) Pulse Rate:  [75-101] 79 (11/22 1029) Resp:  [18] 18 (11/22 0801) BP: (141-151)/(65-87) 141/68 (11/22 1029) SpO2:  [95 %-96 %] 95 % (11/22 0916) Weight:  [77.8 kg] 77.8 kg (11/22 0541) Blood pressure (!) 141/68, pulse 79, temperature 98.4 F (36.9 C), temperature source Oral, resp. rate 18, height 5\' 11"  (1.803 m), weight 77.8 kg, SpO2 95 %.    Intake/Output from previous day: 11/21 0701 - 11/22 0700 In: 156.3 [I.V.:156.3] Out: 1825 [Urine:1825]  Intake/Output this shift: Total I/O In: 120 [P.O.:120] Out: -    General appearance:  Ill-appearing, NAD. Family at bedside. Resp:  Decr breath sounds, no wheezes. Cardio:  irreg irreg GI:  Soft, benign, BS+ Extremities: NO edema.   Lab Results: Results for orders placed or performed during the hospital encounter of 04/26/18 (from the past 24 hour(s))  Glucose, capillary     Status: Abnormal   Collection Time: 05/07/18  9:41 PM  Result Value Ref Range   Glucose-Capillary 102 (H) 70 - 99 mg/dL  CBC     Status: Abnormal   Collection Time: 05/08/18  5:51 AM  Result Value Ref Range   WBC 13.3 (H) 4.0 - 10.5 K/uL   RBC 2.80 (L) 4.22 - 5.81 MIL/uL   Hemoglobin 8.1 (L) 13.0 - 17.0 g/dL   HCT 26.2 (L) 39.0 - 52.0 %   MCV 93.6 80.0 - 100.0 fL   MCH 28.9 26.0 - 34.0 pg   MCHC 30.9 30.0 - 36.0 g/dL   RDW 16.0 (H) 11.5 - 15.5 %   Platelets 209 150 - 400 K/uL   nRBC 0.0 0.0 - 0.2 %  Basic metabolic panel     Status: Abnormal   Collection Time: 05/08/18  5:51 AM  Result Value Ref Range   Sodium 140 135 - 145 mmol/L   Potassium 3.8 3.5 - 5.1 mmol/L   Chloride 106 98 - 111 mmol/L   CO2 26 22 - 32 mmol/L   Glucose, Bld 196 (H) 70 - 99 mg/dL   BUN 43 (H) 8 - 23 mg/dL   Creatinine, Ser  1.56 (H) 0.61 - 1.24 mg/dL   Calcium 8.4 (L) 8.9 - 10.3 mg/dL   GFR calc non Af Amer 41 (L) >60 mL/min   GFR calc Af Amer 48 (L) >60 mL/min   Anion gap 8 5 - 15  Glucose, capillary     Status: None   Collection Time: 05/08/18  8:03 AM  Result Value Ref Range   Glucose-Capillary 97 70 - 99 mg/dL  Glucose, capillary     Status: Abnormal   Collection Time: 05/08/18 11:55 AM  Result Value Ref Range   Glucose-Capillary 118 (H) 70 - 99 mg/dL  Glucose, capillary     Status: Abnormal   Collection Time: 05/08/18  4:37 PM  Result Value Ref Range   Glucose-Capillary 101 (H) 70 - 99 mg/dL     Recent Labs    05/06/18 0534  05/06/18 2244 05/07/18 0429 05/08/18 0551  WBC 13.2*  --   --  13.6* 13.3*  HGB 8.1*   < > 8.3* 8.5* 8.1*  HCT 25.1*   < > 25.7* 26.8* 26.2*  PLT 164  --   --  208 209   < > = values in  this interval not displayed.   BMET Recent Labs    05/06/18 0534 05/07/18 0429 05/08/18 0551  NA 145 142 140  K 4.8 3.9 3.8  CL 106 107 106  CO2 29 28 26   GLUCOSE 134* 113* 196*  BUN 65* 53* 43*  CREATININE 1.77* 1.68* 1.56*  CALCIUM 8.3* 8.7* 8.4*   LFT Recent Labs    05/07/18 0429  PROT 5.7*  ALBUMIN 2.7*  AST 137*  ALT 106*  ALKPHOS 47  BILITOT 0.7   PT/INR No results for input(s): LABPROT, INR in the last 72 hours. Hepatitis Panel No results for input(s): HEPBSAG, HCVAB, HEPAIGM, HEPBIGM in the last 72 hours. C-Diff No results for input(s): CDIFFTOX in the last 72 hours. No results for input(s): CDIFFPCR in the last 72 hours.   Studies/Results: No results found.  Scheduled Inpatient Medications:   . amiodarone  400 mg Oral BID  . aspirin  81 mg Oral Daily  . atorvastatin  40 mg Oral Daily  . budesonide (PULMICORT) nebulizer solution  0.5 mg Nebulization BID  . clonazepam  0.25 mg Oral BID  . feeding supplement (ENSURE ENLIVE)  237 mL Oral BID BM  . furosemide  40 mg Oral Daily  . insulin aspart  0-9 Units Subcutaneous TID WC & HS  .  ipratropium-albuterol  3 mL Nebulization BID  . lipase/protease/amylase  12,000 Units Oral TID AC  . mouth rinse  15 mL Mouth Rinse BID  . metoprolol tartrate  100 mg Oral BID  . multivitamin with minerals  1 tablet Oral Daily  . pantoprazole (PROTONIX) IV  40 mg Intravenous Q12H  . polyethylene glycol  17 g Oral Daily  . QUEtiapine  100 mg Oral BID  . senna-docusate  2 tablet Oral BID  . sodium chloride flush  10-40 mL Intracatheter Q12H  . zolpidem  5 mg Oral QHS    Continuous Inpatient Infusions:    PRN Inpatient Medications:  acetaminophen **OR** acetaminophen, bisacodyl, bisacodyl, fentaNYL (SUBLIMAZE) injection, ondansetron **OR** ondansetron (ZOFRAN) IV, oxyCODONE-acetaminophen, sodium chloride flush   Assessment:  1. Anemia - Low but stable with Hgb 8.1 EGD on this hospitalizatino showed only HH and colonoscopy in 2016 showed only ascending colon polyp and colonic diverticulosis. 2. Afib with RVR - improving. 3. Septic shock - resolved.  4. Moderate transaminaemia - ?Mild congestive hepatopathy vs. Resolving sepsis vs. Drug-induced. Stable and improving.     Plan:  1. Continue current care. 2. No further endoluminal evaluation is advised at this time unless significant drop in  H/H and/or overt gastrointestinal bleeding ensues. 3. Will be happy to see again as needed.   Sherriann Szuch K. Alice Reichert, M.D. 05/08/2018, 6:46 PM

## 2018-05-08 NOTE — Evaluation (Signed)
Occupational Therapy Evaluation Patient Details Name: Matthew Zamora MRN: [id number] DOB: May 02, 1941 Today's Date: 05/08/2018    History of Present Illness presented to ER secondary to worsening SOB; admitted with septic shock, acute/chronic respiratory failure related to PNA, pulmonary edema.  Intubated 11/10-11/13 and 11/14-11/19.  Hospital course additionally significant for periods of agitation (requiring sedation) and afib with RVR (cardiology following).   Clinical Impression   Pt seen for OT evaluation this date. Prior to hospital admission, pt was independent with ADL tasks and mobility.  Pt stated he will be able to live with son upon discharge. Pt had edema in BLE and LUE >RUE and pain in LLE.  Currently pt demonstrates impairments (see OT Problem List below) compared to PLOF requiring MAX assist +2 for safety for LB dressing and bathing, toilet tranfers and functional mobility.   Pt would benefit from skilled OT to address noted impairments and functional limitations in order to maximize safety and independence while minimizing falls risk and caregiver burden.  Upon hospital discharge, recommend pt discharge to SNF.    Follow Up Recommendations  SNF    Equipment Recommendations  Tub/shower bench;3 in 1 bedside commode    Recommendations for Other Services       Precautions / Restrictions Precautions Precautions: Fall;ICD/Pacemaker Restrictions Weight Bearing Restrictions: No      Mobility Bed Mobility Overal bed mobility: Needs Assistance          General bed mobility comments: MAX A +2   Transfers Overall transfer level: Needs assistance Equipment used: 2 person hand held assist Transfers: Stand Pivot Transfers Sit to Stand: Max assist Stand pivot transfers: Max assist;+2 physical assistance       General transfer comment: Pt transferred from recliner with MAX A +2 physical assist x2. Significant Posterior lean during transfer. Pt incontinent with BM  during transfer. HR remained steady throughout session ranging from 79-84.     Balance Overall balance assessment: Needs assistance Sitting-balance support: No upper extremity supported;Feet supported Sitting balance-Leahy Scale: Poor Sitting balance - Comments: Leaned to R side; discussed with pt importance of changing positions for skin integrity Postural control: Posterior lean;Right lateral lean     Standing balance comment: unable/unsafe                            ADL either performed or assessed with clinical judgement   ADL Overall ADL's : Needs assistance/impaired                                       General ADL Comments: MAX A +2 for physical assist for LB dressing/bathing in seated position, toilet transfers and functional mobility; MIN A for UB dressing/bathing in seated position     Vision Baseline Vision/History: No visual deficits Patient Visual Report: No change from baseline       Perception     Praxis      Pertinent Vitals/Pain Pain Assessment: Faces Faces Pain Scale: Hurts whole lot Pain Location: L LE Pain Intervention(s): Monitored during session;Limited activity within patient's tolerance;Repositioned     Hand Dominance Right   Extremity/Trunk Assessment Upper Extremity Assessment Upper Extremity Assessment: Generalized weakness(Pt grossly 3+ to 4/5 BUE; 75% bilat shoulder flexion)   Lower Extremity Assessment Lower Extremity Assessment: LLE deficits/detail LLE Deficits / Details: Pt stated lots of pain in LLE. Unable to lift LLE off floor.  LLE: Unable to fully assess due to pain       Communication Communication Communication: No difficulties   Cognition Arousal/Alertness: Awake/alert Behavior During Therapy: WFL for tasks assessed/performed Overall Cognitive Status: Within Functional Limits for tasks assessed                                 General Comments: Pt initiated conversation and able  to answer eval questions independently   General Comments  Pt had edema in BLE and LUE> RUE;  reddness and sore around bottom; RN notified and in room.    Exercises Other Exercises Other Exercises: postural control side of bed Other Exercises: Pt educated in skin integrity mgt including changing positions while sitting in chair   Shoulder Instructions      Home Living Family/patient expects to be discharged to:: Private residence Living Arrangements: Alone Available Help at Discharge: Family;Available PRN/intermittently   Home Access: Stairs to enter Entrance Stairs-Number of Steps: 5 Entrance Stairs-Rails: Right Home Layout: Two level;Able to live on main level with bedroom/bathroom     Bathroom Shower/Tub: Occupational psychologist: Standard     Home Equipment: None          Prior Functioning/Environment Level of Independence: Independent        Comments: Indep with ADLs, household and community mobilization without assist device; + driving; denies fall history.        OT Problem List: Decreased strength;Decreased activity tolerance;Increased edema;Impaired UE functional use;Pain;Decreased range of motion;Impaired balance (sitting and/or standing)      OT Treatment/Interventions: Self-care/ADL training;Therapeutic exercise;Patient/family education;Therapeutic activities;Balance training    OT Goals(Current goals can be found in the care plan section) Acute Rehab OT Goals Patient Stated Goal: to go home OT Goal Formulation: With patient Time For Goal Achievement: 05/22/18 Potential to Achieve Goals: Good ADL Goals Pt Will Perform Lower Body Dressing: sit to/from stand;with min assist Pt Will Transfer to Toilet: with min assist;bedside commode;stand pivot transfer  OT Frequency: Min 2X/week   Barriers to D/C:            Co-evaluation              AM-PAC PT "6 Clicks" Daily Activity     Outcome Measure Help from another person eating  meals?: A Little Help from another person taking care of personal grooming?: A Little Help from another person toileting, which includes using toliet, bedpan, or urinal?: A Lot Help from another person bathing (including washing, rinsing, drying)?: A Lot Help from another person to put on and taking off regular upper body clothing?: A Little Help from another person to put on and taking off regular lower body clothing?: A Lot 6 Click Score: 15   End of Session Equipment Utilized During Treatment: Gait belt  Activity Tolerance: Patient limited by pain Patient left: in bed;with call bell/phone within reach;with bed alarm set(Nurse tech in room)  OT Visit Diagnosis: Pain;Other abnormalities of gait and mobility (R26.89) Pain - Right/Left: Left Pain - part of body: Leg                Time: 3428-7681 OT Time Calculation (min): 34 min Charges:     Jadene Pierini OTS  05/08/2018, 3:45 PM

## 2018-05-08 NOTE — Progress Notes (Signed)
Physical Therapy Treatment Patient Details Name: Matthew Zamora MRN: [id number] DOB: 04/01/1941 Today's Date: 05/08/2018    History of Present Illness presented to ER secondary to worsening SOB; admitted with septic shock, acute/chronic respiratory failure related to PNA, pulmonary edema.  Intubated 11/10-11/13 and 11/14-11/19.  Hospital course additionally significant for periods of agitation (requiring sedation) and afib with RVR (cardiology following).    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  Will continue on with control of balance, tried L lateral balance on L elbow with better midline correction.  CNA stepped in to assist and supervise with PT transfer, esp to monitor cath and pivot hips as needed.  Pt is in chair with alarm, has UE's and posture supported with pillows and comfortable with family in to visit.    Follow Up Recommendations  SNF     Equipment Recommendations       Recommendations for Other Services       Precautions / Restrictions Precautions Precautions: Fall;ICD/Pacemaker Restrictions Weight Bearing Restrictions: No    Mobility  Bed Mobility Overal bed mobility: Needs Assistance Bed Mobility: Supine to Sit     Supine to sit: Mod assist;Max assist     General bed mobility comments: followed cues to reach to R side rail with LUE  Transfers Overall transfer level: Needs assistance Equipment used: 1 person hand held assist;2 person hand held assist Transfers: Sit to/from [employer] Transfers Sit to Stand: Max assist Stand pivot transfers: Max assist;From elevated surface       General transfer comment: second person for safety and stood once then stood and pivoted to the chair  Ambulation/Gait             General Gait Details: unable   Stairs             Wheelchair Mobility    Modified Rankin (Stroke Patients Only)       Balance      Sitting balance-Leahy Scale: Poor Sitting balance - Comments: can help control sporadically but leans to R and back at times                                    Cognition Arousal/Alertness: Awake/alert Behavior During Therapy: WFL for tasks assessed/performed Overall Cognitive Status: Within Functional Limits for tasks assessed                                 General Comments: able to talk and follow instructions      Exercises Other Exercises Other Exercises: postural control side of bed    General Comments        Pertinent Vitals/Pain Pain Assessment: No/denies pain    Home Living                      Prior Function            PT Goals (current goals can now be found in the care plan section) Acute Rehab PT Goals Patient Stated Goal: to get OOB to chair PT Goal Formulation: With patient/family Progress towards PT goals: Progressing toward goals    Frequency    Min 2X/week      PT Plan Current plan remains appropriate    Co-evaluation  AM-PAC PT "6 Clicks" Daily Activity  Outcome Measure  Difficulty turning over in bed (including adjusting bedclothes, sheets and blankets)?: Unable Difficulty moving from lying on back to sitting on the side of the bed? : Unable Difficulty sitting down on and standing up from a chair with arms (e.g., wheelchair, bedside commode, etc,.)?: Unable Help needed moving to and from a bed to chair (including a wheelchair)?: Total Help needed walking in hospital room?: Total Help needed climbing 3-5 steps with a railing? : Total 6 Click Score: 6    End of Session   Activity Tolerance: Patient tolerated treatment well;Patient limited by fatigue Patient left: in chair;with call bell/phone within reach;with chair alarm set;with family/visitor present;with nursing/sitter in room Nurse Communication: Mobility status(reminded nursing to use maxi lift ) PT Visit Diagnosis: Muscle  weakness (generalized) (M62.81);Difficulty in walking, not elsewhere classified (R26.2)     Time: 5374-8270 PT Time Calculation (min) (ACUTE ONLY): 31 min  Charges:  $Therapeutic Activity: 8-22 mins $Neuromuscular Re-education: 8-22 mins                     Ramond Dial 05/08/2018, 12:37 PM   Mee Hives, PT MS Acute Rehab Dept. Number: [place] [facility] [place]

## 2018-05-08 NOTE — NC FL2 (Signed)
[place] LEVEL OF CARE SCREENING TOOL     IDENTIFICATION  Patient Name: Matthew Zamora Birthdate: [birth year] Sex: male Admission Date (Current Location): 04/26/2018  [place] and [place] Number:  Engineering geologist and Address:  [facility], [address], [place], [place] [address]      Provider Number: [id number]  Attending Physician Name and Address:  Max Sane, MD  Relative Name and Phone Number:  Isahia, Hollerbach   [phone] or [place], Baris [phone]  [phone] or Henreitta Cea   [phone]     Current Level of Care: Hospital Recommended Level of Care: [place] Prior Approval Number:    Date Approved/Denied:   PASRR Number: [id number]  Discharge Plan: SNF    Current Diagnoses: Patient Active Problem List   Diagnosis Date Noted  . Paroxysmal atrial fibrillation ([name])   . Acute respiratory failure ([place]) 04/26/2018    Orientation RESPIRATION BLADDER Height & Weight     Self, Time, Place  Normal Continent Weight: 171 lb 9.6 oz (77.8 kg) Height:  5\' 11"  (180.3 cm)  BEHAVIORAL SYMPTOMS/MOOD NEUROLOGICAL BOWEL NUTRITION STATUS      Continent Diet(Dysphagia 3 diet)  AMBULATORY STATUS COMMUNICATION OF NEEDS Skin   Limited Assist Verbally Normal                       Personal Care Assistance Level of Assistance  Bathing, Feeding, Dressing Bathing Assistance: Limited assistance Feeding assistance: Limited assistance Dressing Assistance: Limited assistance     Functional Limitations Info  Sight, Hearing, Speech Sight Info: Adequate Hearing Info: Adequate Speech Info: Adequate    SPECIAL CARE FACTORS FREQUENCY  PT (By licensed PT), OT (By licensed OT)     PT Frequency: 5x a week OT Frequency: 5x a week            Contractures Contractures Info: Not present    Additional Factors Info  Code Status, Allergies, Insulin Sliding Scale, Psychotropic Code  Status Info: Full Code Allergies Info: NKA Psychotropic Info: QUEtiapine (SEROQUEL) tablet 100 mg  Insulin Sliding Scale Info: insulin aspart (novoLOG) injection 0-9 Units 3x a day with meals       Current Medications (05/08/2018):  This is the current hospital active medication list Current Facility-Administered Medications  Medication Dose Route Frequency Provider Last Rate Last Dose  . acetaminophen (TYLENOL) tablet 650 mg  650 mg Oral Q6H PRN Samaan, Maged, MD       Or  . acetaminophen (TYLENOL) suppository 650 mg  650 mg Rectal Q6H PRN Samaan, Maged, MD      . amiodarone (PACERONE) tablet 400 mg  400 mg Oral BID Christell Faith M, PA-C   400 mg at 05/08/18 0940  . aspirin chewable tablet 81 mg  81 mg Oral Daily Cassandria Santee, MD   81 mg at 05/08/18 0940  . atorvastatin (LIPITOR) tablet 40 mg  40 mg Oral Daily Soyla Murphy, Maged, MD   40 mg at 05/07/18 1727  . bisacodyl (DULCOLAX) EC tablet 5 mg  5 mg Oral Daily PRN Amelia Jo, MD      . bisacodyl (DULCOLAX) suppository 10 mg  10 mg Rectal Daily PRN Tukov-Yual, Magdalene S, NP      . budesonide (PULMICORT) nebulizer solution 0.5 mg  0.5 mg Nebulization BID Flora Lipps, MD   0.5 mg at 05/08/18 0916  . clonazepam (KLONOPIN) disintegrating tablet 0.25 mg  0.25 mg Oral BID Cassandria Santee, MD   0.25  mg at 05/08/18 0939  . feeding supplement (ENSURE ENLIVE) (ENSURE ENLIVE) liquid 237 mL  237 mL Oral BID BM Samaan, Maged, MD   237 mL at 05/07/18 1455  . fentaNYL (SUBLIMAZE) injection 25 mcg  25 mcg Intravenous Q4H PRN Cassandria Santee, MD   25 mcg at 05/07/18 1222  . furosemide (LASIX) tablet 40 mg  40 mg Oral Daily Christell Faith M, PA-C   40 mg at 05/08/18 1053  . insulin aspart (novoLOG) injection 0-9 Units  0-9 Units Subcutaneous TID WC & HS Vaughan Basta, MD      . ipratropium-albuterol (DUONEB) 0.5-2.5 (3) MG/3ML nebulizer solution 3 mL  3 mL Nebulization BID Max Sane, MD   3 mL at 05/08/18 0916  . lipase/protease/amylase (CREON) capsule  12,000 Units  12,000 Units Oral TID Charna Archer Vaughan Basta, MD   12,000 Units at 05/07/18 1657  . MEDLINE mouth rinse  15 mL Mouth Rinse BID Soyla Murphy, Maged, MD   15 mL at 05/07/18 1433  . metoprolol tartrate (LOPRESSOR) tablet 100 mg  100 mg Oral BID Christell Faith M, PA-C   100 mg at 05/08/18 2458  . multivitamin with minerals tablet 1 tablet  1 tablet Oral Daily Cassandria Santee, MD   1 tablet at 05/07/18 1359  . ondansetron (ZOFRAN) tablet 4 mg  4 mg Oral Q6H PRN Amelia Jo, MD       Or  . ondansetron [facility]) injection 4 mg  4 mg Intravenous Q6H PRN Amelia Jo, MD      . oxyCODONE-acetaminophen (PERCOCET/ROXICET) 5-325 MG per tablet 1 tablet  1 tablet Oral Q6H PRN Max Sane, MD   1 tablet at 05/07/18 1359  . pantoprazole (PROTONIX) injection 40 mg  40 mg Intravenous Q12H Cassandria Santee, MD   40 mg at 05/08/18 0940  . polyethylene glycol (MIRALAX / GLYCOLAX) packet 17 g  17 g Oral Daily Max Sane, MD   17 g at 05/08/18 0940  . QUEtiapine (SEROQUEL) tablet 100 mg  100 mg Oral BID Flora Lipps, MD   100 mg at 05/08/18 1053  . senna-docusate (Senokot-S) tablet 2 tablet  2 tablet Oral BID Max Sane, MD   2 tablet at 05/08/18 0939  . sodium chloride flush (NS) 0.9 % injection 10-40 mL  10-40 mL Intracatheter Q12H Conforti, John, DO   10 mL at 05/08/18 0957  . sodium chloride flush (NS) 0.9 % injection 10-40 mL  10-40 mL Intracatheter PRN Conforti, John, DO      . zolpidem (AMBIEN) tablet 5 mg  5 mg Oral QHS Max Sane, MD   5 mg at 05/07/18 2202     Discharge Medications: Please see discharge summary for a list of discharge medications.  Relevant Imaging Results:  Relevant Lab Results:   Additional Information SSN [id number]  Ross Ludwig, [place]

## 2018-05-08 NOTE — Progress Notes (Signed)
Speech Language Pathology Treatment: Dysphagia  Patient Details Name: Matthew Zamora MRN: [id number] DOB: 1940/10/30 Today's Date: 05/08/2018 Time: 9417-4081 SLP Time Calculation (min) (ACUTE ONLY): 35 min  Assessment / Plan / Recommendation Clinical Impression  Pt seen today for ongoing assessment of toleration of oral diet; education on aspiration on aspiration precautions. Pt and family stated he has been tolerating the various po's, including thin liquids, w/ no overt coughing or choking noted. No decline in respiratory status per MD notes. Pt was sitting up in the chair w/ some discomfort noted. NSG updated after session.  Discussed w/ pt and Son his swallowing and the need for strict aspiration precautions to include Small, Single Sips slowly but also the need to sit Upright for best control when drinking liquids. Pt acknowledged the recommendations to NOT Straws at this time as well. Pt consumed small sips of thin liquid via Cup stating it was "fine". He was distracted and did not want any further po intake/trials despite encouragement. He agreed he would try more "later". Son reported pt tells him the same thing but then "never eats it". Recommended f/u w/ Dietitian for nutritional support.  Recommend continue current diet - Regular w/ meats cut well, small; moistened foods. A more regular diet would allow for pt to have options and interest in the foods but educated pt/Son on food consistencies and the need to choose options for easier oral intake to lessen exertion while sick. Both agreed. Recommend Aspiration precautions and Pills in Puree for safer swallowing. ST services will f/u w/ toleration of oral diet next week. NSG updated.     HPI HPI: Pt is a 77 y.o. male with a known history of multiple medical issues including CAD, CKD3, pacemaker, HTN, anemia and other comorbidities. Per EMS report, patient called 911 because he was hot and very short of breath. At the arrival to  emergency room, patient continued to be in severe respiratory distress and combative, hence he was intubated. Chest x-ray shows patchy left-sided airspace opacity may reflect worsening pneumonia or asymmetric pulmonary edema. Underlying vascular congestion noted. Pt was extubated on 04/29/18 doing well respiratory wise but still anxious, agitated and restless. Pt was then reintubated on 04/30/18 d/t extreme agitation w/precedex off, tachy, freq PVCs, hypertensive, face red, unable to be redirecrted, hypoxic at 80 on 2 liters, could not tolerate NRB, Pt is now extubated again. He has been tolerating ice chips w/ NSG w/ no overt s/s of aspiration or decline in respiratory status noted per report.       SLP Plan  Continue with current plan of care       Recommendations  Diet recommendations: Dysphagia 3 (mechanical soft);Thin liquid Liquids provided via: Cup;No straw Medication Administration: Whole meds with puree(for safer swallowing) Supervision: Staff to assist with self feeding;Full supervision/cueing for compensatory strategies Compensations: Minimize environmental distractions;Slow rate;Small sips/bites;Lingual sweep for clearance of pocketing;Multiple dry swallows after each bite/sip;Follow solids with liquid Postural Changes and/or Swallow Maneuvers: Seated upright 90 degrees;Upright 30-60 min after meal                General recommendations: (Dietician f/u for support) Oral Care Recommendations: Oral care BID;Staff/trained caregiver to provide oral care Follow up Recommendations: (TBD) SLP Visit Diagnosis: Dysphagia, oropharyngeal phase (R13.12) Plan: Continue with current plan of care       [place], [place], CCC-SLP , 05/08/2018, 4:11 PM

## 2018-05-08 NOTE — Care Management Important Message (Signed)
Initial Medicare IM signed.  Copy in room for reference.

## 2018-05-08 NOTE — Progress Notes (Signed)
Patient ID: Matthew Zamora, male   DOB: 12-25-1940, 77 y.o.   MRN: [id number]  [facility] PROGRESS NOTE  JOSEMARIA BRINING TKP:[id number] DOB: Oct 14, 1940 DOA: 04/26/2018 PCP: Maryland Pink, MD  HPI/Subjective Very edematous, denies any new complaint, would like to ambulate Objective: Vitals:   05/08/18 0938 05/08/18 1029  BP:  (!) 141/68  Pulse: 80 79  Resp:    Temp:    SpO2:      Filed Weights   05/06/18 1327 05/07/18 0327 05/08/18 0541  Weight: 84.2 kg 79.1 kg 77.8 kg    ROS: Review of Systems  Constitutional: Positive for malaise/fatigue. Negative for chills, fever and weight loss.  HENT: Negative for nosebleeds and sore throat.   Eyes: Negative for blurred vision.  Respiratory: Negative for cough, shortness of breath and wheezing.   Cardiovascular: Positive for leg swelling. Negative for chest pain, orthopnea and PND.  Gastrointestinal: Negative for abdominal pain, constipation, diarrhea, heartburn, nausea and vomiting.  Genitourinary: Negative for dysuria and urgency.  Musculoskeletal: Negative for back pain.  Skin: Negative for rash.  Neurological: Negative for dizziness, speech change, focal weakness and headaches.  Endo/Heme/Allergies: Does not bruise/bleed easily.  Psychiatric/Behavioral: Negative for depression.   Exam: Physical Exam  HENT:  Nose: No mucosal edema.  Mouth/Throat: No oropharyngeal exudate or posterior oropharyngeal edema.  Eyes: Conjunctivae and lids are normal.  Neck: No JVD present. Carotid bruit is not present. No edema present. No thyroid mass and no thyromegaly present.  Cardiovascular: Regular rhythm, S1 normal, S2 normal and normal heart sounds. Exam reveals no gallop.  No murmur heard. Pulses:      Dorsalis pedis pulses are 2+ on the right side, and 2+ on the left side.  Respiratory: No respiratory distress. He has decreased breath sounds in the right middle field, the right lower field, the left middle field and the left  lower field. He has no wheezes. He has no rhonchi. He has no rales.  GI: Soft. Bowel sounds are normal. There is no tenderness.  Musculoskeletal: He exhibits edema.       Right ankle: He exhibits no swelling.       Left ankle: He exhibits no swelling.  Lymphadenopathy:    He has no cervical adenopathy.  Neurological: He is alert.  Skin: Skin is warm. No rash noted. Nails show no clubbing.    Data Reviewed: Basic Metabolic Panel: Recent Labs  Lab 05/02/18 0201 05/02/18 1655  05/04/18 0558 05/04/18 0600 05/05/18 0435 05/06/18 0534 05/07/18 0429 05/08/18 0551  NA 145  --    < >  --  143 142 145 142 140  K 3.9  --    < >  --  4.3 4.1 4.8 3.9 3.8  CL 103  --    < >  --  99 101 106 107 106  CO2 27  --    < >  --  33* 32 29 28 26   GLUCOSE 151*  --    < >  --  171* 163* 134* 113* 196*  BUN 52*  --    < >  --  70* 81* 65* 53* 43*  CREATININE 1.96*  --    < >  --  1.85* 2.02* 1.77* 1.68* 1.56*  CALCIUM 8.8*  --    < >  --  8.6* 8.1* 8.3* 8.7* 8.4*  MG 2.3 2.1  --  2.5*  --   --  2.6* 2.7*  --   PHOS 3.5 3.9  --  5.3*  --   --  4.7* 2.9  --    < > = values in this interval not displayed.   Liver Function Tests: Recent Labs  Lab 05/06/18 0534 05/07/18 0429  AST 151* 137*  ALT 88* 106*  ALKPHOS 44 47  BILITOT 0.8 0.7  PROT 5.4* 5.7*  ALBUMIN 2.5* 2.7*   CBC: Recent Labs  Lab 05/05/18 0435 05/05/18 0723  05/06/18 0534 05/06/18 1546 05/06/18 2244 05/07/18 0429 05/08/18 0551  WBC 11.0* 11.2*  --  13.2*  --   --  13.6* 13.3*  NEUTROABS  --  9.1*  --  11.7*  --   --   --   --   HGB 6.7* 6.7*   < > 8.1* 8.4* 8.3* 8.5* 8.1*  HCT 21.1* 20.8*   < > 25.1* 25.6* 25.7* 26.8* 26.2*  MCV 91.7 91.6  --  90.3  --   --  92.4 93.6  PLT 159 155  --  164  --   --  208 209   < > = values in this interval not displayed.   Cardiac Enzymes: No results for input(s): CKTOTAL, CKMB, CKMBINDEX, TROPONINI in the last 168 hours. BNP (last 3 results) Recent Labs    04/26/18 0030  04/27/18 1054 04/29/18 0302  BNP 717.0* 799.0* 889.0*    CBG: Recent Labs  Lab 05/06/18 2102 05/07/18 0753 05/07/18 1712 05/07/18 2141 05/08/18 0803  GLUCAP 108* 104* 113* 102* 97    Recent Results (from the past 240 hour(s))  CULTURE, BLOOD (ROUTINE X 2) w Reflex to ID Panel     Status: None   Collection Time: 04/28/18 11:52 AM  Result Value Ref Range Status   Specimen Description BLOOD LEFT HAND  Final   Special Requests   Final    BOTTLES DRAWN AEROBIC AND ANAEROBIC Blood Culture results may not be optimal due to an inadequate volume of blood received in culture bottles   Culture   Final    NO GROWTH 5 DAYS Performed at [facility], [address] [place], [place] [address]    Report Status 05/03/2018 FINAL  Final  Culture, blood (Routine X 2) w Reflex to ID Panel     Status: None   Collection Time: 04/28/18  1:47 PM  Result Value Ref Range Status   Specimen Description BLOOD BLOOD LEFT HAND  Final   Special Requests   Final    BOTTLES DRAWN AEROBIC AND ANAEROBIC Blood Culture adequate volume   Culture   Final    NO GROWTH 5 DAYS Performed at [facility], [address], [place], [place] [address]    Report Status 05/03/2018 FINAL  Final     Studies: No results found.  Scheduled Meds: . amiodarone  400 mg Oral BID  . aspirin  81 mg Oral Daily  . atorvastatin  40 mg Oral Daily  . budesonide (PULMICORT) nebulizer solution  0.5 mg Nebulization BID  . clonazepam  0.25 mg Oral BID  . feeding supplement (ENSURE ENLIVE)  237 mL Oral BID BM  . furosemide  40 mg Oral Daily  . insulin aspart  0-9 Units Subcutaneous TID WC & HS  . ipratropium-albuterol  3 mL Nebulization BID  . lipase/protease/amylase  12,000 Units Oral TID AC  . mouth rinse  15 mL Mouth Rinse BID  . metoprolol tartrate  100 mg Oral BID  . multivitamin with minerals  1 tablet Oral Daily  . pantoprazole (PROTONIX) IV  40  mg Intravenous Q12H  . polyethylene glycol  17 g Oral  Daily  . QUEtiapine  100 mg Oral BID  . senna-docusate  2 tablet Oral BID  . sodium chloride flush  10-40 mL Intracatheter Q12H  . zolpidem  5 mg Oral QHS   Continuous Infusions:   Assessment/Plan:  1. Septic shock secondary to pneumonia: completed Abx course.  No fever 2. Acute hypoxic respiratory failure with respiratory acidosis: resolved. On N.C. Atrial fibrillation with rapid ventricular response.  Echocardiogram showed an EF of 40 to 45%.   - Not a good candidate for anticoagulation given severe anemia -Per cardiology transition from IV amiodarone to po amiodarone 400 gm bid x 1 week, then 200 mg bid x 1 week, then 200 mg daily thereafter -Increased metoprolol to 100 mg bid 3. Acute metabolic encephalopathy.  CT scan negative. Now resolved. 4. Elevated troponin secondary to demand ischemia with septic shock.  On aspirin 5. Acute kidney injury on chronic kidney disease stage III.  Creatinine 1.56 6. Pancreatic insufficiency on pancreatic enzymes 7. Anemia of chronic disease.  Hemoglobin down to 8.5 8. Hyperlipidemia unspecified on Lipitor 9. History of prostate cancer 10. Acute on chronic systolic CHF: EF 88-75%, Lasix 40 mg p.o. daily 11. 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 Secondary hyperparathyroidism: mgmt per Nephro 13. Anasarca: He is +8 L, will continue Diuretics -Lasix 40 mg p.o. daily    Code Status: Full code Family Communication: Spoke with family y'day.  No family at bedside today Disposition Plan: Home with home health patient and family preference.  I feel if we continue to diurese him (as he's still 8 liters positive) and work with him daily for ambulation and reassess him in the next few days he might be appropriate for home with home health.  Consultants:  Cardiology  Nephrology  Antibiotics: - completed  Time spent: 35 minutes.  Case discussed with nursing staff  [facility]

## 2018-05-09 DIAGNOSIS — I5021 Acute systolic (congestive) heart failure: Secondary | ICD-10-CM

## 2018-05-09 LAB — BASIC METABOLIC PANEL
ANION GAP: 9 (ref 5–15)
BUN: 41 mg/dL — ABNORMAL HIGH (ref 8–23)
CO2: 23 mmol/L (ref 22–32)
CREATININE: 1.54 mg/dL — AB (ref 0.61–1.24)
Calcium: 8.8 mg/dL — ABNORMAL LOW (ref 8.9–10.3)
Chloride: 110 mmol/L (ref 98–111)
GFR calc non Af Amer: 42 mL/min — ABNORMAL LOW (ref >60)
GFR, EST AFRICAN AMERICAN: 49 mL/min — AB (ref >60)
Glucose, Bld: 92 mg/dL (ref 70–99)
POTASSIUM: 4 mmol/L (ref 3.5–5.1)
Sodium: 142 mmol/L (ref 135–145)

## 2018-05-09 LAB — CBC
HCT: 27.7 % — ABNORMAL LOW (ref 39.0–52.0)
HEMOGLOBIN: 8.7 g/dL — AB (ref 13.0–17.0)
MCH: 29.4 pg (ref 26.0–34.0)
MCHC: 31.4 g/dL (ref 30.0–36.0)
MCV: 93.6 fL (ref 80.0–100.0)
Platelets: 240 10*3/uL (ref 150–400)
RBC: 2.96 MIL/uL — ABNORMAL LOW (ref 4.22–5.81)
RDW: 16.1 % — ABNORMAL HIGH (ref 11.5–15.5)
WBC: 15.7 10*3/uL — AB (ref 4.0–10.5)
nRBC: 0 % (ref 0.0–0.2)

## 2018-05-09 LAB — GLUCOSE, CAPILLARY
Glucose-Capillary: 88 mg/dL (ref 70–99)
Glucose-Capillary: 126 mg/dL — ABNORMAL HIGH (ref 70–99)
Glucose-Capillary: 84 mg/dL (ref 70–99)
Glucose-Capillary: 103 mg/dL — ABNORMAL HIGH (ref 70–99)

## 2018-05-09 NOTE — Progress Notes (Signed)
Patient ID: Matthew Zamora, male   DOB: 06/02/1941, 77 y.o.   MRN: [id number]  [facility] [employer] PROGRESS NOTE  Matthew Zamora PZW:[id number] DOB: [birth year] DOA: 04/26/2018 PCP: Maryland Pink, MD  HPI/Subjective Has generalized weakness Lower extremity edema noted Objective: Vitals:   05/09/18 0736 05/09/18 0900  BP: 126/60 120/77  Pulse: 90 (!) 122  Resp: 19   Temp: 98.2 F (36.8 C)   SpO2: 98%     Filed Weights   05/07/18 0327 05/08/18 0541 05/09/18 0300  Weight: 79.1 kg 77.8 kg 75.7 kg    ROS: Review of Systems  Constitutional: Positive for malaise/fatigue. Negative for chills, fever and weight loss.  HENT: Negative for nosebleeds and sore throat.   Eyes: Negative for blurred vision.  Respiratory: Negative for cough, shortness of breath and wheezing.   Cardiovascular: Positive for leg swelling. Negative for chest pain, orthopnea and PND.  Gastrointestinal: Negative for abdominal pain, constipation, diarrhea, heartburn, nausea and vomiting.  Genitourinary: Negative for dysuria and urgency.  Musculoskeletal: Negative for back pain.  Skin: Negative for rash.  Neurological: Negative for dizziness, speech change, focal weakness and headaches.  Endo/Heme/Allergies: Does not bruise/bleed easily.  Psychiatric/Behavioral: Negative for depression.   Exam: Physical Exam  HENT:  Nose: No mucosal edema.  Mouth/Throat: No oropharyngeal exudate or posterior oropharyngeal edema.  Eyes: Conjunctivae and lids are normal.  Neck: No JVD present. Carotid bruit is not present. No edema present. No thyroid mass and no thyromegaly present.  Cardiovascular: Regular rhythm, S1 normal, S2 normal and normal heart sounds. Exam reveals no gallop.  No murmur heard. Pulses:      Dorsalis pedis pulses are 2+ on the right side, and 2+ on the left side.  Respiratory: No respiratory distress. He has decreased breath sounds in the right middle field, the right lower field, the left middle  field and the left lower field. He has no wheezes. He has no rhonchi. He has no rales.  GI: Soft. Bowel sounds are normal. There is no tenderness.  Musculoskeletal: He exhibits edema.       Right ankle: He exhibits no swelling.       Left ankle: He exhibits no swelling.  Lymphadenopathy:    He has no cervical adenopathy.  Neurological: He is alert.  Skin: Skin is warm. No rash noted. Nails show no clubbing.    Data Reviewed: Basic Metabolic Panel: Recent Labs  Lab 05/02/18 1655  05/04/18 0558  05/05/18 0435 05/06/18 0534 05/07/18 0429 05/08/18 0551 05/09/18 0524  NA  --    < >  --    < > 142 145 142 140 142  K  --    < >  --    < > 4.1 4.8 3.9 3.8 4.0  CL  --    < >  --    < > 101 106 107 106 110  CO2  --    < >  --    < > 32 29 28 26 23   GLUCOSE  --    < >  --    < > 163* 134* 113* 196* 92  BUN  --    < >  --    < > 81* 65* 53* 43* 41*  CREATININE  --    < >  --    < > 2.02* 1.77* 1.68* 1.56* 1.54*  CALCIUM  --    < >  --    < > 8.1* 8.3* 8.7* 8.4* 8.8*  MG 2.1  --  2.5*  --   --  2.6* 2.7*  --   --   PHOS 3.9  --  5.3*  --   --  4.7* 2.9  --   --    < > = values in this interval not displayed.   Liver Function Tests: Recent Labs  Lab 05/06/18 0534 05/07/18 0429  AST 151* 137*  ALT 88* 106*  ALKPHOS 44 47  BILITOT 0.8 0.7  PROT 5.4* 5.7*  ALBUMIN 2.5* 2.7*   CBC: Recent Labs  Lab 05/05/18 0723  05/06/18 0534 05/06/18 1546 05/06/18 2244 05/07/18 0429 05/08/18 0551 05/09/18 0524  WBC 11.2*  --  13.2*  --   --  13.6* 13.3* 15.7*  NEUTROABS 9.1*  --  11.7*  --   --   --   --   --   HGB 6.7*   < > 8.1* 8.4* 8.3* 8.5* 8.1* 8.7*  HCT 20.8*   < > 25.1* 25.6* 25.7* 26.8* 26.2* 27.7*  MCV 91.6  --  90.3  --   --  92.4 93.6 93.6  PLT 155  --  164  --   --  208 209 240   < > = values in this interval not displayed.   Cardiac Enzymes: No results for input(s): CKTOTAL, CKMB, CKMBINDEX, TROPONINI in the last 168 hours. BNP (last 3 results) Recent Labs     04/26/18 0030 04/27/18 1054 04/29/18 0302  BNP 717.0* 799.0* 889.0*    CBG: Recent Labs  Lab 05/08/18 1155 05/08/18 1637 05/08/18 2045 05/09/18 0741 05/09/18 1147  GLUCAP 118* 101* 101* 88 126*    No results found for this or any previous visit (from the past 240 hour(s)).   Studies: No results found.  Scheduled Meds: . amiodarone  400 mg Oral BID  . aspirin  81 mg Oral Daily  . atorvastatin  40 mg Oral Daily  . budesonide (PULMICORT) nebulizer solution  0.5 mg Nebulization BID  . clonazepam  0.25 mg Oral BID  . feeding supplement (ENSURE ENLIVE)  237 mL Oral BID BM  . furosemide  40 mg Oral Daily  . insulin aspart  0-9 Units Subcutaneous TID WC & HS  . ipratropium-albuterol  3 mL Nebulization BID  . lipase/protease/amylase  12,000 Units Oral TID AC  . mouth rinse  15 mL Mouth Rinse BID  . metoprolol tartrate  100 mg Oral BID  . multivitamin with minerals  1 tablet Oral Daily  . pantoprazole (PROTONIX) IV  40 mg Intravenous Q12H  . polyethylene glycol  17 g Oral Daily  . QUEtiapine  100 mg Oral BID  . senna-docusate  2 tablet Oral BID  . sodium chloride flush  10-40 mL Intracatheter Q12H  . zolpidem  5 mg Oral QHS   Continuous Infusions:   Assessment/Plan:  1. Status post septic shock secondary to pneumonia: completed Abx course.  No fever 2. Acute hypoxic respiratory failure with respiratory acidosis: resolved.  Weaned off oxygen via nasal cannula N.C. Atrial fibrillation with rapid ventricular response.  Echocardiogram showed an EF of 40 to 45%.   - Not a good candidate for anticoagulation given severe anemia -Per cardiology transition from IV amiodarone to po amiodarone 400 gm bid x 1 week, then 200 mg bid x 1 week, then 200 mg daily thereafter -Increased metoprolol to 100 mg bid 3. Acute metabolic encephalopathy.  CT scan negative. Now resolved. 4. Elevated troponin secondary to demand ischemia with septic shock.  On aspirin. Outpatient ischemic cardiac  work-up 5. Acute kidney injury on chronic kidney disease stage III.  Creatinine 1.54 6. Pancreatic insufficiency on pancreatic enzymes 7. Anemia of chronic disease.  Hemoglobin down to 8.5 8. Hyperlipidemia unspecified on Lipitor 9. History of prostate cancer 10. Acute on chronic systolic CHF: EF 62-26%, Lasix 40 mg p.o. daily 11. Abdominal aortic aneurysm: outpt f/up with vascular surgery 12. Secondary hyperparathyroidism: mgmt per Nephro 13. Anasarca improving slowly: positive fluid over load, will continue Diuretics -Lasix 40 mg p.o. Daily. Monitor out put.    Code Status: Full code Family Communication: Spoke with family today. Son at bedside. Disposition Plan: Home with home health patient and family preference.  I feel if we continue to diurese him (as he's still 8 liters positive) and work with him daily for ambulation and reassess him in the next few days he might be appropriate for home with home health based on out put  Consultants:  Cardiology  Nephrology  Antibiotics: - completed  Time spent: 33 minutes.  Case discussed with nursing staff  [facility]

## 2018-05-09 NOTE — Progress Notes (Signed)
Progress Note  Patient Name: Matthew Zamora Date of Encounter: 05/09/2018  Primary Cardiologist: [facility]  Subjective   Went back into afib from NSR around 5 AM this morning. He is fatigued overall but feels mildly improved. Still with some shortness of breath. No recent stools.  Inpatient Medications    Scheduled Meds: . amiodarone  400 mg Oral BID  . aspirin  81 mg Oral Daily  . atorvastatin  40 mg Oral Daily  . budesonide (PULMICORT) nebulizer solution  0.5 mg Nebulization BID  . clonazepam  0.25 mg Oral BID  . feeding supplement (ENSURE ENLIVE)  237 mL Oral BID BM  . furosemide  40 mg Oral Daily  . insulin aspart  0-9 Units Subcutaneous TID WC & HS  . ipratropium-albuterol  3 mL Nebulization BID  . lipase/protease/amylase  12,000 Units Oral TID AC  . mouth rinse  15 mL Mouth Rinse BID  . metoprolol tartrate  100 mg Oral BID  . multivitamin with minerals  1 tablet Oral Daily  . pantoprazole (PROTONIX) IV  40 mg Intravenous Q12H  . polyethylene glycol  17 g Oral Daily  . QUEtiapine  100 mg Oral BID  . senna-docusate  2 tablet Oral BID  . sodium chloride flush  10-40 mL Intracatheter Q12H  . zolpidem  5 mg Oral QHS   Continuous Infusions:  PRN Meds: acetaminophen **OR** acetaminophen, bisacodyl, bisacodyl, fentaNYL (SUBLIMAZE) injection, ondansetron **OR** ondansetron (ZOFRAN) IV, oxyCODONE-acetaminophen, sodium chloride flush   Vital Signs    Vitals:   05/08/18 1952 05/09/18 0300 05/09/18 0736 05/09/18 0900  BP: 120/60 117/63 126/60 120/77  Pulse: 64 73 90 (!) 122  Resp: 17 17 19    Temp: 99.2 F (37.3 C) 98.4 F (36.9 C) 98.2 F (36.8 C)   TempSrc: Oral Oral Oral Oral  SpO2: 100% 97% 98%   Weight:  75.7 kg    Height:        Intake/Output Summary (Last 24 hours) at 05/09/2018 1202 Last data filed at 05/09/2018 1000 Gross per 24 hour  Intake 30 ml  Output 1675 ml  Net -1645 ml   Filed Weights   05/07/18 0327 05/08/18 0541 05/09/18 0300  Weight:  79.1 kg 77.8 kg 75.7 kg    Telemetry    Went back into afib from NSR around 5 AM this morning - Personally Reviewed  ECG    No new since 11/10 - Personally Reviewed  Physical Exam   GEN: No acute distress.  Appears frail, pale, multiple ecchymoses across upper extremities Neck: No appreciable JVD. Cardiac: irregularly irregular, no murmurs, rubs, or gallops.  Respiratory: coarse bilaterally GI: Soft, nontender, non-distended.   MS: No edema; No deformity. Neuro:  Alert and appropriate to questions, Nonfocal.  Psych: Normal affect.  Labs    Chemistry Recent Labs  Lab 05/06/18 0534 05/07/18 0429 05/08/18 0551 05/09/18 0524  NA 145 142 140 142  K 4.8 3.9 3.8 4.0  CL 106 107 106 110  CO2 29 28 26 23   GLUCOSE 134* 113* 196* 92  BUN 65* 53* 43* 41*  CREATININE 1.77* 1.68* 1.56* 1.54*  CALCIUM 8.3* 8.7* 8.4* 8.8*  PROT 5.4* 5.7*  --   --   ALBUMIN 2.5* 2.7*  --   --   AST 151* 137*  --   --   ALT 88* 106*  --   --   ALKPHOS 44 47  --   --   BILITOT 0.8 0.7  --   --  GFRNONAA 36* 38* 41* 42*  GFRAA 41* 44* 48* 49*  ANIONGAP 10 7 8 9      Hematology Recent Labs  Lab 05/07/18 0429 05/08/18 0551 05/09/18 0524  WBC 13.6* 13.3* 15.7*  RBC 2.90* 2.80* 2.96*  HGB 8.5* 8.1* 8.7*  HCT 26.8* 26.2* 27.7*  MCV 92.4 93.6 93.6  MCH 29.3 28.9 29.4  MCHC 31.7 30.9 31.4  RDW 16.5* 16.0* 16.1*  PLT 208 209 240    Cardiac EnzymesNo results for input(s): TROPONINI in the last 168 hours. No results for input(s): TROPIPOC in the last 168 hours.   BNPNo results for input(s): BNP, PROBNP in the last 168 hours.   DDimer No results for input(s): DDIMER in the last 168 hours.   Radiology    No results found.  Cardiac Studies   Echo 04/27/2018: Study Conclusions  - Left ventricle: The cavity size was normal. There was moderate focal basal hypertrophy of the septum. Systolic function was mildly to moderately reduced. The estimated ejection fraction was in the  range of 40% to 45%. Images were inadequate for LV wall motion assessment. The study is not technically sufficient to allow evaluation of LV diastolic function. - Mitral valve: There was mild regurgitation. - Left atrium: The atrium was moderately dilated. - Right atrium: The atrium was mildly dilated. - Pulmonary arteries: Systolic pressure was moderately increased. PA peak pressure: 50 mm Hg (S). - Inferior vena cava: The vessel was dilated. The respirophasic diameter changes were blunted (<50%), consistent with elevated central venous pressure.  Patient Profile     77 y.o. male with history of coronary artery disease, chronic systolic heart failure, ventricular tachycardia status post ICD, and chronic kidney disease stage III,whowas admitted due to acute respiratory failure thought to be due to pneumonia and developed intermittent episodes of atrial fibrillation with rapid ventricular response.  Assessment & Plan    1. Acute respiratory failure with hypoxia and hypercapnia: -On room air -Stable  2. PAF with RVR: -reverted back to afib this AM from NSR -Continue oral load of amiodarone 400 gm bid x 1 week, then 200 mg bid x 1 week, then 200 mg daily thereafter -Continue metoprolol 100 mg bid; if well tolerated without bradycardia or hypotension, consolidate to 200 mg metoprolol succinate daily. -Not on [place] given anemia with GI bleed, s/p EGD and planning for outpatient colonoscopy  -Ambulate  3. CAD with elevated troponin: -Felt to be demand ischemia -Echo as above -on beta blocker as above -ASA -Lipitor -Consider outpatient ischemic evaluation   4. Acute on chronic HFrEF: -Continue PO Lasix 40 mg daily with KCl repletion  -Metoprolol as above -no ACEi/ARB/spiro currently given AKI -Outpatient ischemic evaluation as above  5. Acute on CKD stage III: -Improving -Monitor   6. Anemia: -HGB low, though stable -Not on OAC as above -Recommend to maintain HGB  > 8.5  For questions or updates, please contact [place] Please consult [email or web address] for contact info under Cardiology/STEMI.    Signed, Buford Dresser, MD, PhD [facility]  [address] [place], [place] [address] [phone] 05/09/2018, 12:02 PM

## 2018-05-09 NOTE — Progress Notes (Signed)
Pt attempted to get out of bed, bed alarm sounding when this nurse responded.  Pt with feet and soiled linen on floor next to bed.  Pt redirected with out difficulty, cleaned and placed back to bed.  Bed alarm on, floor mats in place.  [id number]

## 2018-05-10 LAB — GLUCOSE, CAPILLARY
GLUCOSE-CAPILLARY: 96 mg/dL (ref 70–99)
GLUCOSE-CAPILLARY: 113 mg/dL — AB (ref 70–99)
Glucose-Capillary: 95 mg/dL (ref 70–99)
Glucose-Capillary: 111 mg/dL — ABNORMAL HIGH (ref 70–99)

## 2018-05-10 LAB — BASIC METABOLIC PANEL
Anion gap: 10 (ref 5–15)
BUN: 37 mg/dL — AB (ref 8–23)
CHLORIDE: 110 mmol/L (ref 98–111)
CO2: 23 mmol/L (ref 22–32)
CREATININE: 1.38 mg/dL — AB (ref 0.61–1.24)
Calcium: 9 mg/dL (ref 8.9–10.3)
GFR calc Af Amer: 56 mL/min — ABNORMAL LOW (ref >60)
GFR calc non Af Amer: 48 mL/min — ABNORMAL LOW (ref >60)
GLUCOSE: 104 mg/dL — AB (ref 70–99)
Potassium: 3.9 mmol/L (ref 3.5–5.1)
SODIUM: 143 mmol/L (ref 135–145)

## 2018-05-10 LAB — CBC
HCT: 27.5 % — ABNORMAL LOW (ref 39.0–52.0)
Hemoglobin: 8.6 g/dL — ABNORMAL LOW (ref 13.0–17.0)
MCH: 29.4 pg (ref 26.0–34.0)
MCHC: 31.3 g/dL (ref 30.0–36.0)
MCV: 93.9 fL (ref 80.0–100.0)
Platelets: 272 10*3/uL (ref 150–400)
RBC: 2.93 MIL/uL — ABNORMAL LOW (ref 4.22–5.81)
RDW: 15.8 % — ABNORMAL HIGH (ref 11.5–15.5)
WBC: 12.9 10*3/uL — ABNORMAL HIGH (ref 4.0–10.5)
nRBC: 0 % (ref 0.0–0.2)

## 2018-05-10 NOTE — Progress Notes (Signed)
Progress Note  Patient Name: Matthew Zamora Date of Encounter: 05/10/2018  Primary Cardiologist: [facility]  Subjective   Fatigued but overall feeling similar to prior. No chest pain. Breathing unchanged. No bleeding.  Inpatient Medications    Scheduled Meds: . amiodarone  400 mg Oral BID  . aspirin  81 mg Oral Daily  . atorvastatin  40 mg Oral Daily  . budesonide (PULMICORT) nebulizer solution  0.5 mg Nebulization BID  . clonazepam  0.25 mg Oral BID  . feeding supplement (ENSURE ENLIVE)  237 mL Oral BID BM  . furosemide  40 mg Oral Daily  . insulin aspart  0-9 Units Subcutaneous TID WC & HS  . ipratropium-albuterol  3 mL Nebulization BID  . lipase/protease/amylase  12,000 Units Oral TID AC  . mouth rinse  15 mL Mouth Rinse BID  . metoprolol tartrate  100 mg Oral BID  . multivitamin with minerals  1 tablet Oral Daily  . pantoprazole (PROTONIX) IV  40 mg Intravenous Q12H  . polyethylene glycol  17 g Oral Daily  . QUEtiapine  100 mg Oral BID  . senna-docusate  2 tablet Oral BID  . sodium chloride flush  10-40 mL Intracatheter Q12H  . zolpidem  5 mg Oral QHS   Continuous Infusions:  PRN Meds: acetaminophen **OR** acetaminophen, bisacodyl, bisacodyl, fentaNYL (SUBLIMAZE) injection, ondansetron **OR** ondansetron (ZOFRAN) IV, oxyCODONE-acetaminophen, sodium chloride flush   Vital Signs    Vitals:   05/09/18 1707 05/09/18 1959 05/10/18 0317 05/10/18 0806  BP: 114/71 125/62 (!) 115/57 (!) 126/96  Pulse: 83 93 85 96  Resp: 19 16 17 19   Temp: 98.2 F (36.8 C) 98.1 F (36.7 C) 98.4 F (36.9 C) 98 F (36.7 C)  TempSrc: Oral Oral Oral Oral  SpO2: 96% 100% 95% 97%  Weight:   72.3 kg   Height:        Intake/Output Summary (Last 24 hours) at 05/10/2018 1132 Last data filed at 05/10/2018 0811 Gross per 24 hour  Intake -  Output 850 ml  Net -850 ml   Filed Weights   05/08/18 0541 05/09/18 0300 05/10/18 0317  Weight: 77.8 kg 75.7 kg 72.3 kg    Telemetry      Atrial fib with PVCs - Personally Reviewed  ECG    No new since 11/10 - Personally Reviewed  Physical Exam   GEN: No acute distress.  Appears frail, pale, multiple ecchymoses across upper extremities Neck: No appreciable JVD. Cardiac: irregularly irregular, no murmurs, rubs, or gallops.  Respiratory: coarse bilaterally GI: Soft, nontender, non-distended.   MS: Trace LE edema; No deformity. Neuro:  Alert and appropriate to questions, Nonfocal.  Psych: Normal affect.  Labs    Chemistry Recent Labs  Lab 05/06/18 0534 05/07/18 0429 05/08/18 0551 05/09/18 0524 05/10/18 0559  NA 145 142 140 142 143  K 4.8 3.9 3.8 4.0 3.9  CL 106 107 106 110 110  CO2 29 28 26 23 23   GLUCOSE 134* 113* 196* 92 104*  BUN 65* 53* 43* 41* 37*  CREATININE 1.77* 1.68* 1.56* 1.54* 1.38*  CALCIUM 8.3* 8.7* 8.4* 8.8* 9.0  PROT 5.4* 5.7*  --   --   --   ALBUMIN 2.5* 2.7*  --   --   --   AST 151* 137*  --   --   --   ALT 88* 106*  --   --   --   ALKPHOS 44 47  --   --   --  BILITOT 0.8 0.7  --   --   --   GFRNONAA 36* 38* 41* 42* 48*  GFRAA 41* 44* 48* 49* 56*  ANIONGAP 10 7 8 9 10      Hematology Recent Labs  Lab 05/08/18 0551 05/09/18 0524 05/10/18 0559  WBC 13.3* 15.7* 12.9*  RBC 2.80* 2.96* 2.93*  HGB 8.1* 8.7* 8.6*  HCT 26.2* 27.7* 27.5*  MCV 93.6 93.6 93.9  MCH 28.9 29.4 29.4  MCHC 30.9 31.4 31.3  RDW 16.0* 16.1* 15.8*  PLT 209 240 272    Cardiac EnzymesNo results for input(s): TROPONINI in the last 168 hours. No results for input(s): TROPIPOC in the last 168 hours.   BNPNo results for input(s): BNP, PROBNP in the last 168 hours.   DDimer No results for input(s): DDIMER in the last 168 hours.   Radiology    No results found.  Cardiac Studies   Echo 04/27/2018: Study Conclusions  - Left ventricle: The cavity size was normal. There was moderate focal basal hypertrophy of the septum. Systolic function was mildly to moderately reduced. The estimated ejection  fraction was in the range of 40% to 45%. Images were inadequate for LV wall motion assessment. The study is not technically sufficient to allow evaluation of LV diastolic function. - Mitral valve: There was mild regurgitation. - Left atrium: The atrium was moderately dilated. - Right atrium: The atrium was mildly dilated. - Pulmonary arteries: Systolic pressure was moderately increased. PA peak pressure: 50 mm Hg (S). - Inferior vena cava: The vessel was dilated. The respirophasic diameter changes were blunted (<50%), consistent with elevated central venous pressure.  Patient Profile     77 y.o. male with history of coronary artery disease, chronic systolic heart failure, ventricular tachycardia status post ICD, and chronic kidney disease stage III,whowas admitted due to acute respiratory failure thought to be due to pneumonia and developed intermittent episodes of atrial fibrillation with rapid ventricular response.  Assessment & Plan    1. Acute respiratory failure with hypoxia and hypercapnia: -On room air -volume status appears nearing euvolemia. Per chart, I/O may not be accurate; states he is >6L positive, but his admission weight was 76.2, and he is currently 72.3 kg. Some of his edema may be due to low albumin and his nutrition state. Has been seen by nutrition during intubation, may be helpful to get reassessment now that he is taking PO.  2. PAF with RVR: -reverted back to afib from NSR -Continue oral load of amiodarone 400 gm bid x 1 week, then 200 mg bid x 1 week, then 200 mg daily thereafter -Continue metoprolol 100 mg bid; if well tolerated without bradycardia or hypotension, consolidate to 200 mg metoprolol succinate daily. -Not on [place] given anemia with GI bleed, s/p EGD and planning for outpatient colonoscopy  -Ambulate with assistance as tolerated, significantly debilitated  3. CAD with elevated troponin: -Felt to be demand ischemia -Echo as above -on  beta blocker as above -ASA -Lipitor -Consider outpatient ischemic evaluation   4. Acute on chronic HFrEF: appears nearing euvolemic -Continue PO Lasix 40 mg daily with KCl repletion  -Metoprolol as above -no ACEi/ARB/spiro currently given AKI -Outpatient ischemic evaluation as above  5. Acute on CKD stage III: -Improving -Monitor   6. Anemia: -HGB low, though stable -Not on OAC as above -Recommend to maintain HGB > 8.5  For questions or updates, please contact [place] Please consult [email or web address] for contact info under Cardiology/STEMI.    Signed, Buford Dresser, MD,  PhD [facility]  [address] [place], [place] [address] [phone] 05/10/2018, 11:32 AM

## 2018-05-10 NOTE — Progress Notes (Signed)
Patient ID: Matthew Zamora, male   DOB: 11/10/1940, 77 y.o.   MRN: [id number]  [employer] PROGRESS NOTE  WILBORN MEMBRENO UTM:[id number] DOB: [birth year] DOA: 04/26/2018 PCP: [place] Pink, MD  HPI/Subjective Has difficulty ambulating Unstable on feet Lower extremity edema improved Objective: Vitals:   05/10/18 0317 05/10/18 0806  BP: (!) 115/57 (!) 126/96  Pulse: 85 96  Resp: 17 19  Temp: 98.4 F (36.9 C) 98 F (36.7 C)  SpO2: 95% 97%    Filed Weights   05/08/18 0541 05/09/18 0300 05/10/18 0317  Weight: 77.8 kg 75.7 kg 72.3 kg    ROS: Review of Systems  Constitutional: Positive for malaise/fatigue. Negative for chills, fever and weight loss.  HENT: Negative for nosebleeds and sore throat.   Eyes: Negative for blurred vision.  Respiratory: Negative for cough, shortness of breath and wheezing.   Cardiovascular: Positive for leg swelling. Negative for chest pain, orthopnea and PND.  Gastrointestinal: Negative for abdominal pain, constipation, diarrhea, heartburn, nausea and vomiting.  Genitourinary: Negative for dysuria and urgency.  Musculoskeletal: Negative for back pain.  Skin: Negative for rash.  Neurological: Negative for dizziness, speech change, focal weakness and headaches.  Endo/Heme/Allergies: Does not bruise/bleed easily.  Psychiatric/Behavioral: Negative for depression.   Exam: Physical Exam  HENT:  Nose: No mucosal edema.  Mouth/Throat: No oropharyngeal exudate or posterior oropharyngeal edema.  Eyes: Conjunctivae and lids are normal.  Neck: No JVD present. Carotid bruit is not present. No edema present. No thyroid mass and no thyromegaly present.  Cardiovascular: Regular rhythm, S1 normal, S2 normal and normal heart sounds. Exam reveals no gallop.  No murmur heard. Pulses:      Dorsalis pedis pulses are 2+ on the right side, and 2+ on the left side.  Respiratory: No respiratory distress. He has decreased breath sounds in the right middle  field, the right lower field, the left middle field and the left lower field. He has no wheezes. He has no rhonchi. He has no rales.  GI: Soft. Bowel sounds are normal. There is no tenderness.  Musculoskeletal: He exhibits edema.       Right ankle: He exhibits no swelling.       Left ankle: He exhibits no swelling.  Lymphadenopathy:    He has no cervical adenopathy.  Neurological: He is alert.  Skin: Skin is warm. No rash noted. Nails show no clubbing.    Data Reviewed: Basic Metabolic Panel: Recent Labs  Lab 05/04/18 0558  05/06/18 0534 05/07/18 0429 05/08/18 0551 05/09/18 0524 05/10/18 0559  NA  --    < > 145 142 140 142 143  K  --    < > 4.8 3.9 3.8 4.0 3.9  CL  --    < > 106 107 106 110 110  CO2  --    < > 29 28 26 23 23   GLUCOSE  --    < > 134* 113* 196* 92 104*  BUN  --    < > 65* 53* 43* 41* 37*  CREATININE  --    < > 1.77* 1.68* 1.56* 1.54* 1.38*  CALCIUM  --    < > 8.3* 8.7* 8.4* 8.8* 9.0  MG 2.5*  --  2.6* 2.7*  --   --   --   PHOS 5.3*  --  4.7* 2.9  --   --   --    < > = values in this interval not displayed.   Liver Function Tests: Recent Labs  Lab 05/06/18 0534 05/07/18 0429  AST 151* 137*  ALT 88* 106*  ALKPHOS 44 47  BILITOT 0.8 0.7  PROT 5.4* 5.7*  ALBUMIN 2.5* 2.7*   CBC: Recent Labs  Lab 05/05/18 0723  05/06/18 0534  05/06/18 2244 05/07/18 0429 05/08/18 0551 05/09/18 0524 05/10/18 0559  WBC 11.2*  --  13.2*  --   --  13.6* 13.3* 15.7* 12.9*  NEUTROABS 9.1*  --  11.7*  --   --   --   --   --   --   HGB 6.7*   < > 8.1*   < > 8.3* 8.5* 8.1* 8.7* 8.6*  HCT 20.8*   < > 25.1*   < > 25.7* 26.8* 26.2* 27.7* 27.5*  MCV 91.6  --  90.3  --   --  92.4 93.6 93.6 93.9  PLT 155  --  164  --   --  208 209 240 272   < > = values in this interval not displayed.   Cardiac Enzymes: No results for input(s): CKTOTAL, CKMB, CKMBINDEX, TROPONINI in the last 168 hours. BNP (last 3 results) Recent Labs    04/26/18 0030 04/27/18 1054 04/29/18 0302  BNP  717.0* 799.0* 889.0*    CBG: Recent Labs  Lab 05/09/18 0741 05/09/18 1147 05/09/18 1710 05/09/18 2124 05/10/18 0806  GLUCAP 88 126* 84 103* 96    No results found for this or any previous visit (from the past 240 hour(s)).   Studies: No results found.  Scheduled Meds: . amiodarone  400 mg Oral BID  . aspirin  81 mg Oral Daily  . atorvastatin  40 mg Oral Daily  . budesonide (PULMICORT) nebulizer solution  0.5 mg Nebulization BID  . clonazepam  0.25 mg Oral BID  . feeding supplement (ENSURE ENLIVE)  237 mL Oral BID BM  . furosemide  40 mg Oral Daily  . insulin aspart  0-9 Units Subcutaneous TID WC & HS  . ipratropium-albuterol  3 mL Nebulization BID  . lipase/protease/amylase  12,000 Units Oral TID AC  . mouth rinse  15 mL Mouth Rinse BID  . metoprolol tartrate  100 mg Oral BID  . multivitamin with minerals  1 tablet Oral Daily  . pantoprazole (PROTONIX) IV  40 mg Intravenous Q12H  . polyethylene glycol  17 g Oral Daily  . QUEtiapine  100 mg Oral BID  . senna-docusate  2 tablet Oral BID  . sodium chloride flush  10-40 mL Intracatheter Q12H  . zolpidem  5 mg Oral QHS   Continuous Infusions:   Assessment/Plan:  1. Status post septic shock secondary to pneumonia: completed Abx course.   2. Acute hypoxic respiratory failure with respiratory acidosis: resolved.  Weaned off oxygen via nasal cannula N.C. currently on room air. 3.Atrial fibrillation with rapid ventricular response.  Echocardiogram showed an EF of 40 to 45%.   - Not a good candidate for anticoagulation given severe anemia -Per cardiology transition from IV amiodarone to po amiodarone 400 gm bid x 1 week, then 200 mg bid x 1 week, then 200 mg daily thereafter -Increased metoprolol to 100 mg bid 3. Acute metabolic encephalopathy resolved 4. Elevated troponin secondary to demand ischemia with septic shock.  On aspirin. Outpatient ischemic cardiac work-up as per cardiology 5. Acute kidney injury on chronic  kidney disease stage III.  Creatinine 1.54 6. Pancreatic insufficiency on pancreatic enzymes supplementation 7. Anemia of chronic disease.  Hemoglobin down to 8.5, stable 8. Hyperlipidemia unspecified on Lipitor 9.  History of prostate cancer 10. Acute on chronic systolic CHF: EF 48-40%, Lasix 40 mg p.o. daily along with potassium supplementation orally 11. Abdominal aortic aneurysm: outpt f/up with vascular surgery 12. Secondary hyperparathyroidism: mgmt per Nephro 13. Anasarca improving : reaching euvolemia, will continue Diuretics -Lasix 40 mg p.o. Daily. Monitor input, out put and daily body weights 14. Ambulatory dysfunction and gait instability -physical therapy evaluation and follow-up to continue    Code Status: Full code Disposition Plan: Home with home health services versus rehab  Consultants:  Cardiology  Nephrology  Antibiotics: - completed  Time spent: 33 minutes.  Case discussed with nursing staff  [facility]

## 2018-05-11 LAB — GLUCOSE, CAPILLARY
GLUCOSE-CAPILLARY: 99 mg/dL (ref 70–99)
Glucose-Capillary: 89 mg/dL (ref 70–99)
Glucose-Capillary: 108 mg/dL — ABNORMAL HIGH (ref 70–99)
Glucose-Capillary: 94 mg/dL (ref 70–99)

## 2018-05-11 MED ORDER — PANTOPRAZOLE SODIUM 40 MG PO TBEC
40.0000 mg | DELAYED_RELEASE_TABLET | Freq: Two times a day (BID) | ORAL | Status: DC
  Administered 2018-05-11 – 2018-05-13 (×4): 40 mg via ORAL
  Filled 2018-05-11 (×4): qty 1

## 2018-05-11 MED ORDER — VITAMIN C 500 MG PO TABS
500.0000 mg | ORAL_TABLET | Freq: Two times a day (BID) | ORAL | Status: DC
  Administered 2018-05-11 – 2018-05-13 (×5): 500 mg via ORAL
  Filled 2018-05-11 (×5): qty 1

## 2018-05-11 NOTE — Progress Notes (Signed)
Nutrition Follow-up  DOCUMENTATION CODES:   Not applicable  INTERVENTION:   Ensure Enlive po BID, each supplement provides 350 kcal and 20 grams of protein  MVI daily  Magic cup TID with meals, each supplement provides 290 kcal and 9 grams of protein  Vitamin C 500mg  po BID  Dysphagia 3 diet   Recommend monitor K, Mg and P labs as pt at high refeeding risk   NUTRITION DIAGNOSIS:   Inadequate oral intake related to acute illness as evidenced by per patient/family report.  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   -Pt noted to have GIB on 11/19 s/p EGD on 11/21 found to have hiatal hernia but NSF for bleeding -Pt s/p SLP evaluation on 11/20; recommended dysphagia 3/thin liquid diet  Pt with improved appetite and oral intake and drinking some Ensure. Pt still SOB. Per chart, pt down 19lbs from admit weight and appears to be back around his UBW. Pt documented to be +2.4L on I & Os. Pt with mild generalize edema. Will initiate vitamin C supplementation as pt with ecchymosis and weakness. Recommend continue supplements and MVI daily.   Medications reviewed and include: aspirin, lasix, insulin, creon, MVI, protonix, miralax, senokot  Labs reviewed: BUN 37(H), creat 1.38(H)- 11/24 Wbc- 12.9(H), Hgb 8.6(L), Hct 27.5(L)  Diet Order:   Diet Order            DIET DYS 3 Room service appropriate? Yes; Fluid consistency: Thin  Diet effective now             EDUCATION NEEDS:   No education needs have been identified at this time  Skin:  Skin Assessment:  Reviewed RN Assessment  Last BM:  11/23- type 7  Height:   Ht Readings from Last 1 Encounters:  05/06/18 5\' 11"  (1.803 m)   Weight:   Wt Readings from Last 1 Encounters:  05/11/18 72.3 kg   Ideal Body Weight:  78.2 kg  BMI:  Body mass index is 22.23 kg/m.  Estimated Nutritional Needs:   Kcal:  1900-2220 (MSJ x 1.2-1.4)  Protein:  100-125 grams (1.2-1.5 grams/kg)  Fluid:  per MD  Koleen Distance MS, RD, LDN Pager #- [phone] Office#- [phone] After Hours Pager: [phone]

## 2018-05-11 NOTE — Progress Notes (Signed)
OT Cancellation Note  Patient Details Name: NASEAN ZAPF MRN: [id number] DOB: Dec 26, 1940   Cancelled Treatment:    Reason Eval/Treat Not Completed: Other (comment). Upon attempt to treat, pt with MD for assessment. Will re-attempt at later date/time as pt is available and medically appropriate.   Jeni Salles, MPH, MS, OTR/L ascom [phone] 05/11/18, 10:14 AM

## 2018-05-11 NOTE — Progress Notes (Signed)
Occupational Therapy Treatment Patient Details Name: Matthew Zamora MRN: [id number] DOB: 1941/06/17 Today's Date: 05/11/2018    History of present illness presented to ER secondary to worsening SOB; admitted with septic shock, acute/chronic respiratory failure related to PNA, pulmonary edema.  Intubated 11/10-11/13 and 11/14-11/19.  Hospital course additionally significant for periods of agitation (requiring sedation) and afib with RVR (cardiology following).   OT comments  Pt seen for OT treatment this date. Pt presented lying supine in bed. Pt asked to be returned to bed at end of session. Pt  sat EOB for ~10 minutes completing grooming tasks including teeth brushing, hair combing and washing face, requiring set up and CGA/MINA for sitting balance. Pt required VC and manual cues for correct postural control during completion of task. Pt exhibited R lateral lean at EOB; placed chuck under R hip to even out hips so pt could find midline. To improve midline sitting balance, pt reached to the L, outside base of support x7 with LUE and RUE support on leg requiring CGA/MINA.   Pt  continues to benefit from skilled OT to address noted impairments and functional limitations  in order to maximize safety and independence while minimizing falls risk and caregiver burden.  Upon hospital discharge, recommend pt discharge to SNF.   Follow Up Recommendations  SNF    Equipment Recommendations       Recommendations for Other Services      Precautions / Restrictions Precautions Precautions: Fall;ICD/Pacemaker Restrictions Weight Bearing Restrictions: No       Mobility Bed Mobility Overal bed mobility: Needs Assistance Bed Mobility: Supine to Sit;Sit to Supine     Supine to sit: +2 for physical assistance;Mod assist Sit to supine: Max assist;+2 for physical assistance      Transfers                     Balance Overall balance assessment: Needs assistance Sitting-balance  support: Feet supported;No upper extremity supported Sitting balance-Leahy Scale: Fair Sitting balance - Comments: R lateral lean; placed chuck under R hip to even out hips so pt could find midline; reached out to L outside base of support x7 with LUE while RUE supported on leg  Postural control: Right lateral lean                                 ADL either performed or assessed with clinical judgement   ADL Overall ADL's : Needs assistance/impaired     Grooming: Minimal assistance Grooming Details (indicate cue type and reason): Pt sat EOB for ~10 minutes completing grooming tasks including teeth brushing, hair combing and washing face. Pt required VC and manual cues for correct postural control during completion of task.                                       Vision Baseline Vision/History: No visual deficits Patient Visual Report: No change from baseline     Perception     Praxis      Cognition Arousal/Alertness: Awake/alert Behavior During Therapy: WFL for tasks assessed/performed Overall Cognitive Status: Within Functional Limits for tasks assessed  Exercises     Shoulder Instructions       General Comments      Pertinent Vitals/ Pain       Pain Assessment: Faces Faces Pain Scale: Hurts little more Pain Location: BLE  L>R Pain Intervention(s): Limited activity within patient's tolerance;Monitored during session;Repositioned  Home Living                                          Prior Functioning/Environment              Frequency  Min 2X/week        Progress Toward Goals  OT Goals(current goals can now be found in the care plan section)  Progress towards OT goals: Progressing toward goals  Acute Rehab OT Goals Patient Stated Goal: to go home OT Goal Formulation: With patient Time For Goal Achievement: 05/25/18 Potential to Achieve Goals:  Good  Plan Discharge plan remains appropriate;Frequency remains appropriate    Co-evaluation                 AM-PAC OT "6 Clicks" Daily Activity     Outcome Measure   Help from another person eating meals?: A Little Help from another person taking care of personal grooming?: A Little Help from another person toileting, which includes using toliet, bedpan, or urinal?: A Lot Help from another person bathing (including washing, rinsing, drying)?: A Lot Help from another person to put on and taking off regular upper body clothing?: A Little Help from another person to put on and taking off regular lower body clothing?: A Lot 6 Click Score: 15    End of Session    OT Visit Diagnosis: Other abnormalities of gait and mobility (R26.89);Pain Pain - Right/Left: Left(both) Pain - part of body: Leg   Activity Tolerance Patient tolerated treatment well   Patient Left in bed;with call bell/phone within reach;with bed alarm set   Nurse Communication          Time: 5102-5852 OT Time Calculation (min): 49 min  Charges:    Matthew Zamora OTS  05/11/2018, 3:51 PM

## 2018-05-11 NOTE — Plan of Care (Signed)
  Problem: Safety: Goal: Ability to remain free from injury will improve Outcome: Progressing   

## 2018-05-11 NOTE — Progress Notes (Signed)
PHARMACIST - PHYSICIAN COMMUNICATION  DR:   Manuella Ghazi  CONCERNING: IV to Oral Route Change Policy  RECOMMENDATION: This patient is receiving protonix by the intravenous route.  Based on criteria approved by the Pharmacy and Therapeutics Committee, the intravenous medication(s) is/are being converted to the equivalent oral dose form(s).   DESCRIPTION: These criteria include:  The patient is eating (either orally or via tube) and/or has been taking other orally administered medications for a least 24 hours  The patient has no evidence of active gastrointestinal bleeding or impaired GI absorption (gastrectomy, short bowel, patient on TNA or NPO).  If you have questions about this conversion, please contact the Pharmacy Department  []   [phone] )  [facility] []   [phone] )  [facility] []   [phone] )  [facility] []   [phone] )  [facility] []   [phone] )  [place], [facility] 05/11/2018 2:56 PM

## 2018-05-11 NOTE — Progress Notes (Signed)
Patient ID: Matthew Zamora, male   DOB: Oct 24, 1940, 77 y.o.   MRN: [id number]  [facility] PROGRESS NOTE  Matthew Zamora:[id number] DOB: [birth year] DOA: 04/26/2018 PCP: [place] Pink, MD  HPI/Subjective Has difficulty ambulating. Feels Tired, laying down in bed Objective: Vitals:   05/11/18 0445 05/11/18 0846  BP: 117/90 111/71  Pulse: 94 92  Resp: 18 20  Temp: 98.6 F (37 C) 98.2 F (36.8 C)  SpO2: 98% 98%    Filed Weights   05/09/18 0300 05/10/18 0317 05/11/18 0445  Weight: 75.7 kg 72.3 kg 72.3 kg    ROS: Review of Systems  Constitutional: Positive for malaise/fatigue. Negative for chills, fever and weight loss.  HENT: Negative for nosebleeds and sore throat.   Eyes: Negative for blurred vision.  Respiratory: Negative for cough, shortness of breath and wheezing.   Cardiovascular: Positive for leg swelling. Negative for chest pain, orthopnea and PND.  Gastrointestinal: Negative for abdominal pain, constipation, diarrhea, heartburn, nausea and vomiting.  Genitourinary: Negative for dysuria and urgency.  Musculoskeletal: Negative for back pain.  Skin: Negative for rash.  Neurological: Negative for dizziness, speech change, focal weakness and headaches.  Endo/Heme/Allergies: Does not bruise/bleed easily.  Psychiatric/Behavioral: Negative for depression.   Exam: Physical Exam  HENT:  Nose: No mucosal edema.  Mouth/Throat: No oropharyngeal exudate or posterior oropharyngeal edema.  Eyes: Conjunctivae and lids are normal.  Neck: No JVD present. Carotid bruit is not present. No edema present. No thyroid mass and no thyromegaly present.  Cardiovascular: Regular rhythm, S1 normal, S2 normal and normal heart sounds. Exam reveals no gallop.  No murmur heard. Pulses:      Dorsalis pedis pulses are 2+ on the right side, and 2+ on the left side.  Respiratory: No respiratory distress. He has decreased breath sounds in the right middle field, the right lower  field, the left middle field and the left lower field. He has no wheezes. He has no rhonchi. He has no rales.  GI: Soft. Bowel sounds are normal. There is no tenderness.  Musculoskeletal: He exhibits edema.       Right ankle: He exhibits no swelling.       Left ankle: He exhibits no swelling.  Lymphadenopathy:    He has no cervical adenopathy.  Neurological: He is alert.  Skin: Skin is warm. No rash noted. Nails show no clubbing.    Data Reviewed: Basic Metabolic Panel: Recent Labs  Lab 05/06/18 0534 05/07/18 0429 05/08/18 0551 05/09/18 0524 05/10/18 0559  NA 145 142 140 142 143  K 4.8 3.9 3.8 4.0 3.9  CL 106 107 106 110 110  CO2 29 28 26 23 23   GLUCOSE 134* 113* 196* 92 104*  BUN 65* 53* 43* 41* 37*  CREATININE 1.77* 1.68* 1.56* 1.54* 1.38*  CALCIUM 8.3* 8.7* 8.4* 8.8* 9.0  MG 2.6* 2.7*  --   --   --   PHOS 4.7* 2.9  --   --   --    Liver Function Tests: Recent Labs  Lab 05/06/18 0534 05/07/18 0429  AST 151* 137*  ALT 88* 106*  ALKPHOS 44 47  BILITOT 0.8 0.7  PROT 5.4* 5.7*  ALBUMIN 2.5* 2.7*   CBC: Recent Labs  Lab 05/05/18 0723  05/06/18 0534  05/06/18 2244 05/07/18 0429 05/08/18 0551 05/09/18 0524 05/10/18 0559  WBC 11.2*  --  13.2*  --   --  13.6* 13.3* 15.7* 12.9*  NEUTROABS 9.1*  --  11.7*  --   --   --   --   --   --  HGB 6.7*   < > 8.1*   < > 8.3* 8.5* 8.1* 8.7* 8.6*  HCT 20.8*   < > 25.1*   < > 25.7* 26.8* 26.2* 27.7* 27.5*  MCV 91.6  --  90.3  --   --  92.4 93.6 93.6 93.9  PLT 155  --  164  --   --  208 209 240 272   < > = values in this interval not displayed.   Cardiac Enzymes: No results for input(s): CKTOTAL, CKMB, CKMBINDEX, TROPONINI in the last 168 hours. BNP (last 3 results) Recent Labs    04/26/18 0030 04/27/18 1054 04/29/18 0302  BNP 717.0* 799.0* 889.0*    CBG: Recent Labs  Lab 05/10/18 1145 05/10/18 1728 05/10/18 2111 05/11/18 0756 05/11/18 1143  GLUCAP 95 113* 111* 89 108*    No results found for this or any  previous visit (from the past 240 hour(s)).   Studies: No results found.  Scheduled Meds: . amiodarone  400 mg Oral BID  . aspirin  81 mg Oral Daily  . atorvastatin  40 mg Oral Daily  . budesonide (PULMICORT) nebulizer solution  0.5 mg Nebulization BID  . clonazepam  0.25 mg Oral BID  . feeding supplement (ENSURE ENLIVE)  237 mL Oral BID BM  . furosemide  40 mg Oral Daily  . insulin aspart  0-9 Units Subcutaneous TID WC & HS  . ipratropium-albuterol  3 mL Nebulization BID  . lipase/protease/amylase  12,000 Units Oral TID AC  . mouth rinse  15 mL Mouth Rinse BID  . metoprolol tartrate  100 mg Oral BID  . multivitamin with minerals  1 tablet Oral Daily  . pantoprazole (PROTONIX) IV  40 mg Intravenous Q12H  . polyethylene glycol  17 g Oral Daily  . QUEtiapine  100 mg Oral BID  . senna-docusate  2 tablet Oral BID  . sodium chloride flush  10-40 mL Intracatheter Q12H  . vitamin C  500 mg Oral BID  . zolpidem  5 mg Oral QHS   Continuous Infusions:   Assessment/Plan:  1. Status post septic shock secondary to pneumonia: completed Abx course.   2. Acute hypoxic respiratory failure with respiratory acidosis: resolved.  Weaned off oxygen via nasal cannula N.C. currently on room air. 3. Atrial fibrillation with rapid ventricular response.  Echocardiogram showed an EF of 40 to 45%.   - Not a good candidate for anticoagulation given severe anemia -Per cardiology transition from IV amiodarone to po amiodarone 400 gm bid x 1 week, then 200 mg bid x 1 week, then 200 mg daily thereafter - metoprolol to 100 mg bid 3. Acute metabolic encephalopathy resolved 4. Elevated troponin secondary to demand ischemia with septic shock.  On aspirin. Outpatient ischemic cardiac work-up as per cardiology 5. Acute kidney injury on chronic kidney disease stage III.  Creatinine 1.54 6. Pancreatic insufficiency on pancreatic enzymes supplementation 7. Anemia of chronic disease.  Hemoglobin down to 8.5,  stable 8. Hyperlipidemia unspecified on Lipitor 9. History of prostate cancer 10. Acute on chronic systolic CHF: EF 81-19%, Lasix 40 mg p.o. daily along with potassium supplementation orally 11. Abdominal aortic aneurysm: outpt f/up with vascular surgery 12. Secondary hyperparathyroidism: mgmt per Nephro 13. Anasarca improving : reaching euvolemia, will continue Diuretics -Lasix 40 mg p.o. Daily. Monitor input, out put and daily body weights. Still positive 2 liters this am (but better than 10 liters + few days ago). Diuresing well without much impact on kidneys 14. Ambulatory dysfunction and  gait instability -PT/OT recommends STR/SNF    Code Status: Full code Disposition Plan: Home with home health services versus rehab - Patient prefers HHPT  Consultants:  Cardiology  Nephrology  Antibiotics: - completed  Time spent: 33 minutes.  Case discussed with nursing staff  [facility]

## 2018-05-11 NOTE — Progress Notes (Signed)
Progress Note  Patient Name: Matthew Zamora Date of Encounter: 05/11/2018  Primary Cardiologist:[facility]  Subjective   Very agitated during today's exam. Denies CP, palpitations, or racing heart rate. Feels SOB when talking for longer than a few sentences. C/o bilateral leg pain and inability to move his toes.   He reportedly sat in the chair today but has not been ambulating around the hospital yet.   Back in Afib.  Inpatient Medications    Scheduled Meds: . amiodarone  400 mg Oral BID  . aspirin  81 mg Oral Daily  . atorvastatin  40 mg Oral Daily  . budesonide (PULMICORT) nebulizer solution  0.5 mg Nebulization BID  . clonazepam  0.25 mg Oral BID  . feeding supplement (ENSURE ENLIVE)  237 mL Oral BID BM  . furosemide  40 mg Oral Daily  . insulin aspart  0-9 Units Subcutaneous TID WC & HS  . ipratropium-albuterol  3 mL Nebulization BID  . lipase/protease/amylase  12,000 Units Oral TID AC  . mouth rinse  15 mL Mouth Rinse BID  . metoprolol tartrate  100 mg Oral BID  . multivitamin with minerals  1 tablet Oral Daily  . pantoprazole (PROTONIX) IV  40 mg Intravenous Q12H  . polyethylene glycol  17 g Oral Daily  . QUEtiapine  100 mg Oral BID  . senna-docusate  2 tablet Oral BID  . sodium chloride flush  10-40 mL Intracatheter Q12H  . zolpidem  5 mg Oral QHS   Continuous Infusions:  PRN Meds: acetaminophen **OR** acetaminophen, bisacodyl, bisacodyl, fentaNYL (SUBLIMAZE) injection, ondansetron **OR** ondansetron (ZOFRAN) IV, oxyCODONE-acetaminophen, sodium chloride flush   Vital Signs    Vitals:   05/10/18 1940 05/10/18 1948 05/11/18 0445 05/11/18 0846  BP:  (!) 98/58 117/90 111/71  Pulse:  (!) 50 94 92  Resp:  18 18 20   Temp:  98.3 F (36.8 C) 98.6 F (37 C) 98.2 F (36.8 C)  TempSrc:  Oral Oral   SpO2: 98% 100% 98% 98%  Weight:   72.3 kg   Height:        Intake/Output Summary (Last 24 hours) at 05/11/2018 0940 Last data filed at 05/11/2018 0235 Gross per  24 hour  Intake -  Output 550 ml  Net -550 ml   Filed Weights   05/09/18 0300 05/10/18 0317 05/11/18 0445  Weight: 75.7 kg 72.3 kg 72.3 kg    Telemetry    IRIR with rates 90-130s - Personally Reviewed  ECG    No new tracings - Personally Reviewed  Physical Exam   GEN: Frail, pale, and elderly man. Agitated. Bilateral ecchymoses along upper extremities.  Neck: No JVD Cardiac: IRIR, soft heart sounds. no murmurs, rubs, or gallops.  Respiratory:Coarse breath sounds, especially at the bases bilaterally  GI: Soft, nontender, non-distended  MS: No edema; No deformity. Neuro:  Nonfocal  Psych: Normal affect though agitated   Labs    Chemistry Recent Labs  Lab 05/06/18 0534 05/07/18 0429 05/08/18 0551 05/09/18 0524 05/10/18 0559  NA 145 142 140 142 143  K 4.8 3.9 3.8 4.0 3.9  CL 106 107 106 110 110  CO2 29 28 26 23 23   GLUCOSE 134* 113* 196* 92 104*  BUN 65* 53* 43* 41* 37*  CREATININE 1.77* 1.68* 1.56* 1.54* 1.38*  CALCIUM 8.3* 8.7* 8.4* 8.8* 9.0  PROT 5.4* 5.7*  --   --   --   ALBUMIN 2.5* 2.7*  --   --   --  AST 151* 137*  --   --   --   ALT 88* 106*  --   --   --   ALKPHOS 44 47  --   --   --   BILITOT 0.8 0.7  --   --   --   GFRNONAA 36* 38* 41* 42* 48*  GFRAA 41* 44* 48* 49* 56*  ANIONGAP 10 7 8 9 10      Hematology Recent Labs  Lab 05/08/18 0551 05/09/18 0524 05/10/18 0559  WBC 13.3* 15.7* 12.9*  RBC 2.80* 2.96* 2.93*  HGB 8.1* 8.7* 8.6*  HCT 26.2* 27.7* 27.5*  MCV 93.6 93.6 93.9  MCH 28.9 29.4 29.4  MCHC 30.9 31.4 31.3  RDW 16.0* 16.1* 15.8*  PLT 209 240 272    Cardiac EnzymesNo results for input(s): TROPONINI in the last 168 hours. No results for input(s): TROPIPOC in the last 168 hours.   BNPNo results for input(s): BNP, PROBNP in the last 168 hours.   DDimer No results for input(s): DDIMER in the last 168 hours.   Radiology    No results found.  Cardiac Studies   Echo 04/27/2018: Study Conclusions - Left ventricle: The cavity  size was normal. There was moderate focal basal hypertrophy of the septum. Systolic function was mildly to moderately reduced. The estimated ejection fraction was in the range of 40% to 45%. Images were inadequate for LV wall motion assessment. The study is not technically sufficient to allow evaluation of LV diastolic function. - Mitral valve: There was mild regurgitation. - Left atrium: The atrium was moderately dilated. - Right atrium: The atrium was mildly dilated. - Pulmonary arteries: Systolic pressure was moderately increased. PA peak pressure: 50 mm Hg (S). - Inferior vena cava: The vessel was dilated. The respirophasic diameter changes were blunted (<50%), consistent with elevated central venous pressure.   Patient Profile     77 y.o. male with history of CAD, HFrEF, VT s/p ICD, AAA, and CKDII, who was admitted due to respiratory failure thought to be pneumonia and developed intermittent episodes of A. fib with RVR.  Assessment & Plan    PAF with RVR -Remains in Afib, IRIR, HR 90s-130s; BP SBP 92-126 / DBP 55-90 -Continue amiodarone 400 mg twice daily x7 days, then 200 mg twice daily x1 week, then 200 mg daily thereafter. -Continue metoprolol 100 mg twice daily with plan to consolidate to 200 mg metoprolol succinate/Toprol-XL daily. Would not escalate dosage at this time given soft BP. -Not on [place] given anemia with GI bleed, status post EGD and planning for outpatient colonoscopy. Hgb 8.6. -Ambulate with assistance as tolerated, given significantly debilitated and c/o bilateral leg pain, weakness, and numb toes.  CAD with elevated troponin -Likely demand ischemia.  Echo as above. -Continue beta-blocker as above with plans for consolidation and with consideration of soft BP. Continue ASA, Lipitor. -Consider outpatient ischemic evaluation.  HLD -As directly above, continue statin -LDL goal <70  Acute on chronic HFrEF (40-45%) -Continue p.o. Lasix 40 mg  daily with KCl repletion, beta-blocker as above. As above, plans to consolidate BB prior to discharge -No ACE/ARB/spiro given AKI  Acute respiratory failure with hypoxia and hypercapnia -ORA -Stable  Acute on CKD III -Improving. -11/24 Cr 1.38, K 3.9 -Recommend daily BMET to monitor renal function and electrolytes -Monitor  DM2 -SSI - Per IM  Anemia -Hgb 8.6 -No OAC as above  -Recommend maintain Hgb >8.5 with low threshold for transfusion given past history    For questions  or updates, please contact [place] Please consult [email or web address] for contact info under        Signed, Arvil Chaco, PA-C  05/11/2018, 9:40 AM

## 2018-05-11 NOTE — Care Management Important Message (Signed)
Copy of signed IM left with patient in room.  

## 2018-05-12 LAB — CBC
HCT: 27.7 % — ABNORMAL LOW (ref 39.0–52.0)
HEMOGLOBIN: 8.5 g/dL — AB (ref 13.0–17.0)
MCH: 29 pg (ref 26.0–34.0)
MCHC: 30.7 g/dL (ref 30.0–36.0)
MCV: 94.5 fL (ref 80.0–100.0)
PLATELETS: 303 10*3/uL (ref 150–400)
RBC: 2.93 MIL/uL — ABNORMAL LOW (ref 4.22–5.81)
RDW: 15.5 % (ref 11.5–15.5)
WBC: 8.4 10*3/uL (ref 4.0–10.5)
nRBC: 0 % (ref 0.0–0.2)

## 2018-05-12 LAB — GLUCOSE, CAPILLARY
GLUCOSE-CAPILLARY: 88 mg/dL (ref 70–99)
Glucose-Capillary: 98 mg/dL (ref 70–99)
Glucose-Capillary: 112 mg/dL — ABNORMAL HIGH (ref 70–99)
Glucose-Capillary: 86 mg/dL (ref 70–99)

## 2018-05-12 LAB — BASIC METABOLIC PANEL
Anion gap: 7 (ref 5–15)
BUN: 33 mg/dL — ABNORMAL HIGH (ref 8–23)
CALCIUM: 8.7 mg/dL — AB (ref 8.9–10.3)
CHLORIDE: 108 mmol/L (ref 98–111)
CO2: 25 mmol/L (ref 22–32)
CREATININE: 1.41 mg/dL — AB (ref 0.61–1.24)
GFR, EST AFRICAN AMERICAN: 54 mL/min — AB (ref >60)
GFR, EST NON AFRICAN AMERICAN: 47 mL/min — AB (ref >60)
Glucose, Bld: 90 mg/dL (ref 70–99)
Potassium: 4 mmol/L (ref 3.5–5.1)
SODIUM: 140 mmol/L (ref 135–145)

## 2018-05-12 MED ORDER — HYDROCODONE-ACETAMINOPHEN 5-325 MG PO TABS
1.0000 | ORAL_TABLET | ORAL | Status: DC | PRN
  Administered 2018-05-12: 1 via ORAL
  Filled 2018-05-12: qty 1

## 2018-05-12 MED ORDER — SODIUM CHLORIDE 0.9% FLUSH
3.0000 mL | Freq: Two times a day (BID) | INTRAVENOUS | Status: DC
  Administered 2018-05-12 – 2018-05-13 (×3): 3 mL via INTRAVENOUS

## 2018-05-12 NOTE — Progress Notes (Signed)
Progress Note  Patient Name: Matthew Zamora Date of Encounter: 05/12/2018  Primary Cardiologist:[facility]  Subjective   Continues to be slightly agitated on exam.   Denies current chest pain, palpitations, racing heart rate at rest,   Reported he has still not yet ambulated.  As documented in previous note, he sat in the chair yesterday with SOB on ambulation to the chair.  He remains in Afib with ventricular rates 80s-90s and PACs. BP soft at 104/60 Hgb 8.5, Cr 1.38  1.41 and at baseline  Inpatient Medications    Scheduled Meds: . aspirin  81 mg Oral Daily  . atorvastatin  40 mg Oral Daily  . budesonide (PULMICORT) nebulizer solution  0.5 mg Nebulization BID  . clonazepam  0.25 mg Oral BID  . feeding supplement (ENSURE ENLIVE)  237 mL Oral BID BM  . furosemide  40 mg Oral Daily  . insulin aspart  0-9 Units Subcutaneous TID WC & HS  . ipratropium-albuterol  3 mL Nebulization BID  . lipase/protease/amylase  12,000 Units Oral TID AC  . mouth rinse  15 mL Mouth Rinse BID  . metoprolol tartrate  100 mg Oral BID  . multivitamin with minerals  1 tablet Oral Daily  . pantoprazole  40 mg Oral BID  . polyethylene glycol  17 g Oral Daily  . QUEtiapine  100 mg Oral BID  . senna-docusate  2 tablet Oral BID  . sodium chloride flush  3 mL Intravenous Q12H  . vitamin C  500 mg Oral BID  . zolpidem  5 mg Oral QHS   Continuous Infusions:  PRN Meds: acetaminophen **OR** acetaminophen, bisacodyl, bisacodyl, ondansetron **OR** ondansetron (ZOFRAN) IV   Vital Signs    Vitals:   05/11/18 2051 05/12/18 0336 05/12/18 0748 05/12/18 0837  BP:  98/62 104/60   Pulse:  89 81   Resp:      Temp:  97.7 F (36.5 C) 98 F (36.7 C)   TempSrc:  Oral    SpO2: 98% 98% 98% 97%  Weight:  68.4 kg    Height:        Intake/Output Summary (Last 24 hours) at 05/12/2018 1036 Last data filed at 05/12/2018 0500 Gross per 24 hour  Intake 490 ml  Output 875 ml  Net -385 ml   Filed Weights   05/10/18 0317 05/11/18 0445 05/12/18 0336  Weight: 72.3 kg 72.3 kg 68.4 kg    Telemetry    IRIR with rates 80s-90s - Personally Reviewed  ECG    No new tracings - Personally Reviewed  Physical Exam   GEN: Frail, pale, and elderly man. Agitated. Bilateral ecchymoses along upper extremities.  Neck: No JVD Cardiac: IRIR, soft heart sounds. no murmurs, rubs, or gallops.  Respiratory:Coarse breath sounds, diminished at the bases bilaterally  GI: Soft, nontender, non-distended  MS: No edema; No deformity. Neuro:  Nonfocal  Psych: Normal affect though agitated   Labs    Chemistry Recent Labs  Lab 05/06/18 0534 05/07/18 0429  05/09/18 0524 05/10/18 0559 05/12/18 0258  NA 145 142   < > 142 143 140  K 4.8 3.9   < > 4.0 3.9 4.0  CL 106 107   < > 110 110 108  CO2 29 28   < > 23 23 25   GLUCOSE 134* 113*   < > 92 104* 90  BUN 65* 53*   < > 41* 37* 33*  CREATININE 1.77* 1.68*   < > 1.54* 1.38* 1.41*  CALCIUM  8.3* 8.7*   < > 8.8* 9.0 8.7*  PROT 5.4* 5.7*  --   --   --   --   ALBUMIN 2.5* 2.7*  --   --   --   --   AST 151* 137*  --   --   --   --   ALT 88* 106*  --   --   --   --   ALKPHOS 44 47  --   --   --   --   BILITOT 0.8 0.7  --   --   --   --   GFRNONAA 36* 38*   < > 42* 48* 47*  GFRAA 41* 44*   < > 49* 56* 54*  ANIONGAP 10 7   < > 9 10 7    < > = values in this interval not displayed.     Hematology Recent Labs  Lab 05/09/18 0524 05/10/18 0559 05/12/18 0258  WBC 15.7* 12.9* 8.4  RBC 2.96* 2.93* 2.93*  HGB 8.7* 8.6* 8.5*  HCT 27.7* 27.5* 27.7*  MCV 93.6 93.9 94.5  MCH 29.4 29.4 29.0  MCHC 31.4 31.3 30.7  RDW 16.1* 15.8* 15.5  PLT 240 272 303    Cardiac EnzymesNo results for input(s): TROPONINI in the last 168 hours. No results for input(s): TROPIPOC in the last 168 hours.   BNPNo results for input(s): BNP, PROBNP in the last 168 hours.   DDimer No results for input(s): DDIMER in the last 168 hours.   Radiology    No results found.  Cardiac Studies    Echo 04/27/2018: Study Conclusions - Left ventricle: The cavity size was normal. There was moderate focal basal hypertrophy of the septum. Systolic function was mildly to moderately reduced. The estimated ejection fraction was in the range of 40% to 45%. Images were inadequate for LV wall motion assessment. The study is not technically sufficient to allow evaluation of LV diastolic function. - Mitral valve: There was mild regurgitation. - Left atrium: The atrium was moderately dilated. - Right atrium: The atrium was mildly dilated. - Pulmonary arteries: Systolic pressure was moderately increased. PA peak pressure: 50 mm Hg (S). - Inferior vena cava: The vessel was dilated. The respirophasic diameter changes were blunted (<50%), consistent with elevated central venous pressure.   Patient Profile     77 y.o. male with history of paroxysmal Afib with RVR, anemia, CAD, HFrEF, VT s/p ICD, AAA, and CKDII, DM2 who was admitted due to respiratory failure thought to be pneumonia and developed intermittent episodes of A. fib with RVR.  Assessment & Plan    PAF with RVR -Remains in Afib, ventricular rates more controlled today at 80s-90s -Stopped amiodarone given controlled ventricular rates on metoprolol tartrate 100mg  po tab BID. Will plan for consolidation to Toprol Xl 200mg  daily. Would not recommend escalation in beta blocker d/t soft BP with SBP 100s. -Not on [place] given anemia with GI bleed, status post EGD and planning for outpatient colonoscopy. Hgb 8.6. -Ambulate with assistance as tolerated, given significantly debilitated and c/o bilateral leg pain, weakness, and numb toes.  CAD with elevated troponin -Likely demand ischemia.  Echo as above. -Continue beta-blocker as above with plans for consolidation and with consideration of soft BP. Continue ASA, Lipitor. -Consider outpatient ischemic evaluation.  HLD -As directly above, continue statin -LDL goal  <70  Acute on chronic HFrEF (40-45%) -Remains euvolemic on exam - Continue p.o. Lasix 40 mg daily with KCl repletion, beta-blocker as above. As  above, plans to consolidate BB prior to discharge -Held ACE/ARB/spiro given AKI at admission- consider restart if kidney function remains at baseline and as HR and vitals allow. Consider soft BP.  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 Plan to restart medications as tolerated.  DM2 -SSI - Per IM  Anemia -No OAC as above  -Recommend maintain Hgb >8.5 with low threshold for transfusion given past history  For questions or updates, please contact [facility] Please consult [email or web address] for contact info under        Signed, Arvil Chaco, PA-C  05/12/2018, 10:36 AM

## 2018-05-12 NOTE — NC FL2 (Signed)
[place] LEVEL OF CARE SCREENING TOOL     IDENTIFICATION  Patient Name: Matthew Zamora Birthdate: Apr 13, 1941 Sex: male Admission Date (Current Location): 04/26/2018  [place] and [place] Number:  Engineering geologist and Address:  [facility], [address] [place], [place] [address]      Provider Number: [id number]  Attending Physician Name and Address:  Max Sane, MD  Relative Name and Phone Number:  Gavon, Majano   [phone] or Jakeim, [place] [phone]  [phone] or Henreitta Cea   [phone]     Current Level of Care: Hospital Recommended Level of Care: [place] Prior Approval Number:    Date Approved/Denied:   PASRR Number: [id number]  Discharge Plan: SNF    Current Diagnoses: Patient Active Problem List   Diagnosis Date Noted  . Paroxysmal atrial fibrillation ([name])   . Acute respiratory failure ([place]) 04/26/2018    Orientation RESPIRATION BLADDER Height & Weight     Self, Time, Place  Normal Continent Weight: 150 lb 11.2 oz (68.4 kg) Height:  5\' 11"  (180.3 cm)  BEHAVIORAL SYMPTOMS/MOOD NEUROLOGICAL BOWEL NUTRITION STATUS      Continent Diet(Dysphagia 3 diet)  AMBULATORY STATUS COMMUNICATION OF NEEDS Skin   Limited Assist Verbally Normal                       Personal Care Assistance Level of Assistance  Bathing, Feeding, Dressing Bathing Assistance: Limited assistance Feeding assistance: Limited assistance Dressing Assistance: Limited assistance     Functional Limitations Info  Sight, Hearing, Speech Sight Info: Adequate Hearing Info: Adequate Speech Info: Adequate    SPECIAL CARE FACTORS FREQUENCY  PT (By licensed PT), OT (By licensed OT)     PT Frequency: 5x a  week OT Frequency: 5x a week            Contractures Contractures Info: Not present    Additional Factors Info  Code Status, Allergies, Insulin Sliding Scale, Psychotropic Code  Status Info: Full Code Allergies Info: NKA Psychotropic Info: QUEtiapine (SEROQUEL) tablet 100 mg  Insulin Sliding Scale Info: insulin aspart (novoLOG) injection 0-9 Units 3x a day with meals       Current Medications (05/12/2018):  This is the current hospital active medication list Current Facility-Administered Medications  Medication Dose Route Frequency Provider Last Rate Last Dose  . acetaminophen (TYLENOL) tablet 650 mg  650 mg Oral Q6H PRN Cassandria Santee, MD   650 mg at 05/12/18 1339   Or  . acetaminophen (TYLENOL) suppository 650 mg  650 mg Rectal Q6H PRN Samaan, Maged, MD      . aspirin chewable tablet 81 mg  81 mg Oral Daily Soyla Murphy, Maged, MD   81 mg at 05/12/18 0914  . atorvastatin (LIPITOR) tablet 40 mg  40 mg Oral Daily Cassandria Santee, MD   40 mg at 05/12/18 1713  . bisacodyl (DULCOLAX) EC tablet 5 mg  5 mg Oral Daily PRN Amelia Jo, MD      . bisacodyl (DULCOLAX) suppository 10 mg  10 mg Rectal Daily PRN Tukov-Yual, Magdalene S, NP      . budesonide (PULMICORT) nebulizer solution 0.5 mg  0.5 mg Nebulization BID Flora Lipps, MD   0.5 mg at 05/12/18 0837  . clonazepam (KLONOPIN) disintegrating tablet 0.25 mg  0.25 mg Oral BID Cassandria Santee, MD   0.25 mg at 05/12/18 0913  . feeding supplement (ENSURE ENLIVE) (ENSURE ENLIVE) liquid 237 mL  237 mL Oral  BID BM Cassandria Santee, MD   237 mL at 05/11/18 3299  . furosemide (LASIX) tablet 40 mg  40 mg Oral Daily Christell Faith M, PA-C   40 mg at 05/12/18 0914  . HYDROcodone-acetaminophen (NORCO/VICODIN) 5-325 MG per tablet 1 tablet  1 tablet Oral Q4H PRN Max Sane, MD   1 tablet at 05/12/18 1558  . insulin aspart (novoLOG) injection 0-9 Units  0-9 Units Subcutaneous TID WC & HS Vaughan Basta, MD   1 Units at 05/09/18 1230  . ipratropium-albuterol (DUONEB) 0.5-2.5 (3) MG/3ML nebulizer solution 3 mL  3 mL Nebulization BID Max Sane, MD   3 mL at 05/12/18 0837  . lipase/protease/amylase (CREON) capsule 12,000 Units  12,000 Units Oral  TID Charna Archer Vaughan Basta, MD   12,000 Units at 05/12/18 1713  . MEDLINE mouth rinse  15 mL Mouth Rinse BID Cassandria Santee, MD   15 mL at 05/12/18 0920  . metoprolol tartrate (LOPRESSOR) tablet 100 mg  100 mg Oral BID Christell Faith M, PA-C   100 mg at 05/12/18 2426  . multivitamin with minerals tablet 1 tablet  1 tablet Oral Daily Cassandria Santee, MD   1 tablet at 05/12/18 0914  . ondansetron (ZOFRAN) tablet 4 mg  4 mg Oral Q6H PRN Amelia Jo, MD       Or  . ondansetron [facility]) injection 4 mg  4 mg Intravenous Q6H PRN Amelia Jo, MD      . pantoprazole (PROTONIX) EC tablet 40 mg  40 mg Oral BID Ramond Dial, RPH   40 mg at 05/12/18 0913  . polyethylene glycol (MIRALAX / GLYCOLAX) packet 17 g  17 g Oral Daily Max Sane, MD   17 g at 05/12/18 0920  . QUEtiapine (SEROQUEL) tablet 100 mg  100 mg Oral BID Flora Lipps, MD   100 mg at 05/12/18 0914  . senna-docusate (Senokot-S) tablet 2 tablet  2 tablet Oral BID Max Sane, MD   2 tablet at 05/12/18 0913  . sodium chloride flush (NS) 0.9 % injection 3 mL  3 mL Intravenous Q12H Max Sane, MD   3 mL at 05/12/18 0920  . vitamin C (ASCORBIC ACID) tablet 500 mg  500 mg Oral BID Max Sane, MD   500 mg at 05/12/18 0914  . zolpidem (AMBIEN) tablet 5 mg  5 mg Oral QHS Max Sane, MD   5 mg at 05/11/18 2225     Discharge Medications: Please see discharge summary for a list of discharge medications.  Relevant Imaging Results:  Relevant Lab Results:   Additional Information SSN [id number]  Ross Ludwig, [place]

## 2018-05-12 NOTE — Progress Notes (Signed)
Talked with son over phone and he is in agreement wanting to place him to rehab which I think is reasonable at this time. I have requested social worker to look into nearby rehab facilities - potential discharge tomorrow

## 2018-05-12 NOTE — Progress Notes (Signed)
Occupational Therapy Treatment Patient Details Name: Matthew Zamora MRN: [id number] DOB: 06/29/1940 Today's Date: 05/12/2018    History of present illness presented to ER secondary to worsening SOB; admitted with septic shock, acute/chronic respiratory failure related to PNA, pulmonary edema.  Intubated 11/10-11/13 and 11/14-11/19.  Hospital course additionally significant for periods of agitation (requiring sedation) and afib with RVR (cardiology following).   OT comments  Pt needed Mod assist and cues for supine to Sit at EOB and then sat EOB for ~12 minutes completing grooming tasks including teeth brushing, hair combing and washing face. Pt required VC and tactile cues for correct postural control during completion of task. Pt starting to fatigue after a few minutes. Pt not able to push up in bed to re-position and needed 2 person assist with increased pain in back and LLE which son MD and NSG are aware of.  He is very limited in mobility and is at high risk for falls.  He stated he would have lots of help at home and his son Matthew Zamora from [place] indicated that is not the case and is very concerned.  Spoke to [place] from [facility] about concerns and strongly rec SNF to increase functional mobiliity, strength and decrease risk of falls.  Follow Up Recommendations  SNF    Equipment Recommendations  Tub/shower bench;3 in 1 bedside commode    Recommendations for Other Services      Precautions / Restrictions Precautions Precautions: Fall;ICD/Pacemaker Precaution Comments: posterior lean and decreased center of balance Restrictions Weight Bearing Restrictions: No       Mobility Bed Mobility                  Transfers                      Balance                                           ADL either performed or assessed with clinical judgement   ADL Overall ADL's : Needs assistance/impaired       Grooming Details (indicate cue type and reason):  Pt needed Mod assist and cues for supine to Sit at EOB and then sat EOB for ~12 minutes completing grooming tasks including teeth brushing, hair combing and washing face. Pt required VC and tactile cues for correct postural control during completion of task. Pt starting to fatigue after a few minutes.                                General ADL Comments: Pt not able to push up in bed to re-position and needed 2 person assist with increased pain in back and LLE which son MD and NSG are aware of.  He is very limited in mobility and is at high risk for falls.  He stated he would have lots of help at home and his son Matthew Zamora from [place] indicated that is not the case and is very concerned.  Spoke to [place] from [facility] about concerns and strongly rec SNF to increase functional mobiliity, strength and decrease risk of falls.     Vision Baseline Vision/History: No visual deficits     Perception     Praxis      Cognition Arousal/Alertness: Awake/alert Behavior During Therapy: Flat affect  General Comments: Concerned that patient indicated he would have a lot of help at home but his son Matthew Zamora did not indicate this when present this session.  Shared concerns with Randall Hiss from SW and strongly rec SNF.        Exercises     Shoulder Instructions       General Comments      Pertinent Vitals/ Pain       Pain Assessment: Faces Pain Score: 4  Pain Location: LLE and back Pain Descriptors / Indicators: Aching;Constant Pain Intervention(s): Limited activity within patient's tolerance;Monitored during session;Repositioned  Home Living                                          Prior Functioning/Environment              Frequency  Min 2X/week        Progress Toward Goals  OT Goals(current goals can now be found in the care plan section)  Progress towards OT goals: Progressing toward goals  Acute Rehab OT  Goals Patient Stated Goal: I might need to go to rehab OT Goal Formulation: With patient/family Time For Goal Achievement: 05/25/18 Potential to Achieve Goals: Good  Plan Discharge plan remains appropriate;Frequency remains appropriate    Co-evaluation                 AM-PAC OT "6 Clicks" Daily Activity     Outcome Measure   Help from another person eating meals?: A Little Help from another person taking care of personal grooming?: A Little Help from another person toileting, which includes using toliet, bedpan, or urinal?: A Lot Help from another person bathing (including washing, rinsing, drying)?: A Lot Help from another person to put on and taking off regular upper body clothing?: A Little Help from another person to put on and taking off regular lower body clothing?: A Lot 6 Click Score: 15    End of Session    OT Visit Diagnosis: Other abnormalities of gait and mobility (R26.89);Pain;Muscle weakness (generalized) (M62.81) Pain - Right/Left: Left Pain - part of body: Leg   Activity Tolerance Patient limited by fatigue   Patient Left in bed;with call bell/phone within reach;with bed alarm set;with family/visitor present(son Matthew Zamora and his daughter Matthew Zamora)   Nurse Communication          Time: [phone] OT Time Calculation (min): 35 min  Charges: OT General Charges $OT Visit: 1 Visit OT Treatments $Self Care/Home Management : 23-37 mins  Chrys Racer, OTR/L ascom [phone] 05/12/18, 11:39 AM

## 2018-05-12 NOTE — Clinical Social Work Note (Addendum)
CSW spoke with patient's son [name] and daughter in [place], they would like patient to go to either [facility], [facility], or [facility] for short term rehab.  CSW is awaiting for bed offers and insurance approval.  3:20pm  [facility] offered a bed for patient, and family has accepted.  CSW contacted [facility] and they will begin insurance authorization process.  CSW to continue to follow patient's progress throughout discharge planning.  Jones Broom. [place], MSW, [place]  05/12/2018 1:37 PM

## 2018-05-12 NOTE — Progress Notes (Signed)
Physical Therapy Treatment Patient Details Name: Matthew Zamora MRN: [id number] DOB: 20-Dec-1940 Today's Date: 05/12/2018    History of Present Illness presented to ER secondary to worsening SOB; admitted with septic shock, acute/chronic respiratory failure related to PNA, pulmonary edema.  Intubated 11/10-11/13 and 11/14-11/19.  Hospital course additionally significant for periods of agitation (requiring sedation) and afib with RVR (cardiology following).    PT Comments    Noted improvement in sitting balance and overall tolerance to upright.  Able to initiate sit/stand attempts with RW, but requires max elevated bed surface and max assist +2 for foot placement, lift off and stabilization of bilat hips/knees.  Heavy posterior trunk lean/weight shift with each standing attempt; unable to achieve full upright/midline as result.  Fatigues rapidly with more intensive activities, requiring return to supine for recovery. Unsafe/unable to attempt stepping/gait attempts away from bed surface.     Follow Up Recommendations  SNF     Equipment Recommendations       Recommendations for Other Services       Precautions / Restrictions Precautions Precautions: Fall;ICD/Pacemaker Precaution Comments: posterior lean and decreased center of balance Restrictions Weight Bearing Restrictions: No    Mobility  Bed Mobility Overal bed mobility: Needs Assistance Bed Mobility: Supine to Sit     Supine to sit: Min assist;Mod assist     General bed mobility comments: cuing for hand placement on bedrails for assist with supine to sit; max ssist +2 for sit to supine  Transfers Overall transfer level: Needs assistance Equipment used: Rolling walker (2 wheeled) Transfers: Sit to/from Stand Sit to Stand: Max assist;+2 physical assistance         General transfer comment: max assist +2 from maximally elevated bed surface; max assist to position LEs, stabilize bilat hips/knees.  very heavy  posteriro trunk lean/weight shift  Ambulation/Gait             General Gait Details: unsafe/unable   Chief Strategy Officer    Modified Rankin (Stroke Patients Only)       Balance Overall balance assessment: Needs assistance Sitting-balance support: No upper extremity supported;Feet supported Sitting balance-Leahy Scale: Fair     Standing balance support: Bilateral upper extremity supported Standing balance-Leahy Scale: Zero                              Cognition Arousal/Alertness: Awake/alert Behavior During Therapy: WFL for tasks assessed/performed Overall Cognitive Status: Within Functional Limits for tasks assessed                                 General Comments: Concerned that patient indicated he would have a lot of help at home but his son [name] did not indicate this when present this session.  Shared concerns with Randall Hiss from SW and strongly rec SNF.      Exercises Other Exercises Other Exercises: Unsupported sitting edge of bed, close sup-maintains static positioning in midline without cuing/assist this date (decreased R lateral lean); very limited ability to mobilize outside immediate BOS, requiring UE support for recovery and return to midline Other Exercises: Sit/stand x2 with RW from max elevated bed surafce, max assist +2 (see above)    General Comments        Pertinent Vitals/Pain Pain Assessment: 0-10 Pain Score: 8  Pain Location: L LE Pain  Descriptors / Indicators: Aching;Grimacing;Guarding Pain Intervention(s): Limited activity within patient's tolerance;Monitored during session;Patient requesting pain meds-RN notified;RN gave pain meds during session;Repositioned    Home Living                      Prior Function            PT Goals (current goals can now be found in the care plan section) Acute Rehab PT Goals Patient Stated Goal: I might need to go to rehab PT Goal Formulation:  With patient/family Time For Goal Achievement: 05/21/18 Potential to Achieve Goals: Good Progress towards PT goals: Progressing toward goals    Frequency    Min 2X/week      PT Plan Current plan remains appropriate    Co-evaluation              AM-PAC PT "6 Clicks" Mobility   Outcome Measure  Help needed turning from your back to your side while in a flat bed without using bedrails?: A Lot Help needed moving from lying on your back to sitting on the side of a flat bed without using bedrails?: A Lot Help needed moving to and from a bed to a chair (including a wheelchair)?: A Lot Help needed standing up from a chair using your arms (e.g., wheelchair or bedside chair)?: A Lot Help needed to walk in hospital room?: Total Help needed climbing 3-5 steps with a railing? : Total 6 Click Score: 10    End of Session Equipment Utilized During Treatment: Gait belt Activity Tolerance: Patient tolerated treatment well Patient left: in bed;with call bell/phone within reach;with bed alarm set Nurse Communication: Mobility status PT Visit Diagnosis: Muscle weakness (generalized) (M62.81);Difficulty in walking, not elsewhere classified (R26.2)     Time: 1329-1400 PT Time Calculation (min) (ACUTE ONLY): 31 min  Charges:  $Therapeutic Activity: 23-37 mins                     Osha Rane H. Owens Shark, PT, DPT, NCS 05/12/18, 2:13 PM [phone]

## 2018-05-12 NOTE — Progress Notes (Signed)
Patient ID: Matthew Zamora, male   DOB: 11/27/1940, 77 y.o.   MRN: [id number]  [facility] PROGRESS NOTE  COLUM Matthew Zamora VHQ:[id number] DOB: 1940-12-28 DOA: 04/26/2018 PCP: Maryland Pink, MD  HPI/Subjective 2 people assist for ambulation,  Objective: Vitals:   05/12/18 0748 05/12/18 0837  BP: 104/60   Pulse: 81   Resp:    Temp: 98 F (36.7 C)   SpO2: 98% 97%    Filed Weights   05/10/18 0317 05/11/18 0445 05/12/18 0336  Weight: 72.3 kg 72.3 kg 68.4 kg    ROS: Review of Systems  Constitutional: Positive for malaise/fatigue. Negative for chills, fever and weight loss.  HENT: Negative for nosebleeds and sore throat.   Eyes: Negative for blurred vision.  Respiratory: Negative for cough, shortness of breath and wheezing.   Cardiovascular: Positive for leg swelling. Negative for chest pain, orthopnea and PND.  Gastrointestinal: Negative for abdominal pain, constipation, diarrhea, heartburn, nausea and vomiting.  Genitourinary: Negative for dysuria and urgency.  Musculoskeletal: Negative for back pain.  Skin: Negative for rash.  Neurological: Negative for dizziness, speech change, focal weakness and headaches.  Endo/Heme/Allergies: Does not bruise/bleed easily.  Psychiatric/Behavioral: Negative for depression.   Exam: Physical Exam  HENT:  Nose: No mucosal edema.  Mouth/Throat: No oropharyngeal exudate or posterior oropharyngeal edema.  Eyes: Conjunctivae and lids are normal.  Neck: No JVD present. Carotid bruit is not present. No edema present. No thyroid mass and no thyromegaly present.  Cardiovascular: Regular rhythm, S1 normal, S2 normal and normal heart sounds. Exam reveals no gallop.  No murmur heard. Pulses:      Dorsalis pedis pulses are 2+ on the right side, and 2+ on the left side.  Respiratory: No respiratory distress. He has decreased breath sounds in the right middle field, the right lower field, the left middle field and the left lower field. He  has no wheezes. He has no rhonchi. He has no rales.  GI: Soft. Bowel sounds are normal. There is no tenderness.  Musculoskeletal: He exhibits edema.       Right ankle: He exhibits no swelling.       Left ankle: He exhibits no swelling.  Lymphadenopathy:    He has no cervical adenopathy.  Neurological: He is alert.  Skin: Skin is warm. No rash noted. Nails show no clubbing.    Data Reviewed: Basic Metabolic Panel: Recent Labs  Lab 05/06/18 0534 05/07/18 0429 05/08/18 0551 05/09/18 0524 05/10/18 0559 05/12/18 0258  NA 145 142 140 142 143 140  K 4.8 3.9 3.8 4.0 3.9 4.0  CL 106 107 106 110 110 108  CO2 29 28 26 23 23 25   GLUCOSE 134* 113* 196* 92 104* 90  BUN 65* 53* 43* 41* 37* 33*  CREATININE 1.77* 1.68* 1.56* 1.54* 1.38* 1.41*  CALCIUM 8.3* 8.7* 8.4* 8.8* 9.0 8.7*  MG 2.6* 2.7*  --   --   --   --   PHOS 4.7* 2.9  --   --   --   --    Liver Function Tests: Recent Labs  Lab 05/06/18 0534 05/07/18 0429  AST 151* 137*  ALT 88* 106*  ALKPHOS 44 47  BILITOT 0.8 0.7  PROT 5.4* 5.7*  ALBUMIN 2.5* 2.7*   CBC: Recent Labs  Lab 05/06/18 0534  05/07/18 0429 05/08/18 0551 05/09/18 0524 05/10/18 0559 05/12/18 0258  WBC 13.2*  --  13.6* 13.3* 15.7* 12.9* 8.4  NEUTROABS 11.7*  --   --   --   --   --   --  HGB 8.1*   < > 8.5* 8.1* 8.7* 8.6* 8.5*  HCT 25.1*   < > 26.8* 26.2* 27.7* 27.5* 27.7*  MCV 90.3  --  92.4 93.6 93.6 93.9 94.5  PLT 164  --  208 209 240 272 303   < > = values in this interval not displayed.   Cardiac Enzymes: No results for input(s): CKTOTAL, CKMB, CKMBINDEX, TROPONINI in the last 168 hours. BNP (last 3 results) Recent Labs    04/26/18 0030 04/27/18 1054 04/29/18 0302  BNP 717.0* 799.0* 889.0*    CBG: Recent Labs  Lab 05/11/18 0756 05/11/18 1143 05/11/18 1700 05/11/18 2035 05/12/18 0745  GLUCAP 89 108* 94 99 88    No results found for this or any previous visit (from the past 240 hour(s)).   Studies: No results  found.  Scheduled Meds: . aspirin  81 mg Oral Daily  . atorvastatin  40 mg Oral Daily  . budesonide (PULMICORT) nebulizer solution  0.5 mg Nebulization BID  . clonazepam  0.25 mg Oral BID  . feeding supplement (ENSURE ENLIVE)  237 mL Oral BID BM  . furosemide  40 mg Oral Daily  . insulin aspart  0-9 Units Subcutaneous TID WC & HS  . ipratropium-albuterol  3 mL Nebulization BID  . lipase/protease/amylase  12,000 Units Oral TID AC  . mouth rinse  15 mL Mouth Rinse BID  . metoprolol tartrate  100 mg Oral BID  . multivitamin with minerals  1 tablet Oral Daily  . pantoprazole  40 mg Oral BID  . polyethylene glycol  17 g Oral Daily  . QUEtiapine  100 mg Oral BID  . senna-docusate  2 tablet Oral BID  . sodium chloride flush  3 mL Intravenous Q12H  . vitamin C  500 mg Oral BID  . zolpidem  5 mg Oral QHS   Continuous Infusions:   Assessment/Plan:  1. Status post septic shock secondary to pneumonia: completed Abx course.   2. Acute hypoxic respiratory failure with respiratory acidosis: resolved.  Weaned off oxygen via nasal cannula N.C. currently on room air. 3. Atrial fibrillation with rapid ventricular response.  Echocardiogram showed an EF of 40 to 45%.   - Not a good candidate for anticoagulation given severe anemia -Amiodarone has been discontinued by cardiology -Continue metoprolol to 100 mg bid, likely will transition to Toprol XL at discharge 3. Acute metabolic encephalopathy resolved 4. Elevated troponin secondary to demand ischemia with septic shock.  On aspirin. Outpatient ischemic cardiac work-up as per cardiology 5. Acute kidney injury on chronic kidney disease stage III.  Creatinine 1.4 6. Pancreatic insufficiency on pancreatic enzymes supplementation 7. Anemia of chronic disease.  Hemoglobin down to 8.5, stable 8. Hyperlipidemia unspecified on Lipitor 9. History of prostate cancer 10. Acute on chronic systolic CHF: EF 26-37%, Lasix 40 mg p.o. daily along with potassium  supplementation orally 11. Abdominal aortic aneurysm: outpt f/up with vascular surgery 12. Secondary hyperparathyroidism: mgmt per Nephro 13. Anasarca improving : reaching euvolemia, will continue Diuretics -Lasix 40 mg p.o. Daily. Monitor input, out put and daily body weights. still positive 500 cc this am (but better than 10 liters + few days ago). Diuresing well without much impact on kidneys 14. Ambulatory dysfunction and gait instability -PT/OT recommends STR/SNF   I've d/w SW to look into STR/SNF as patient is very deconditioned. PT/OT on reval still recommends STR/SNF.  I've not had luck in reaching to family yet   Code Status: Full code Disposition Plan: Home with  home health services versus rehab - Patient prefers HHPT  Consultants:  Cardiology  Nephrology  Antibiotics: - completed  Time spent: 33 minutes.  Case discussed with nursing staff  [facility]

## 2018-05-13 LAB — BASIC METABOLIC PANEL
Anion gap: 8 (ref 5–15)
BUN: 34 mg/dL — ABNORMAL HIGH (ref 8–23)
CO2: 25 mmol/L (ref 22–32)
CREATININE: 1.44 mg/dL — AB (ref 0.61–1.24)
Calcium: 8.7 mg/dL — ABNORMAL LOW (ref 8.9–10.3)
Chloride: 106 mmol/L (ref 98–111)
GFR calc non Af Amer: 47 mL/min — ABNORMAL LOW (ref >60)
GFR, EST AFRICAN AMERICAN: 54 mL/min — AB (ref >60)
Glucose, Bld: 88 mg/dL (ref 70–99)
Potassium: 3.9 mmol/L (ref 3.5–5.1)
Sodium: 139 mmol/L (ref 135–145)

## 2018-05-13 LAB — CBC
HEMATOCRIT: 28 % — AB (ref 39.0–52.0)
Hemoglobin: 8.8 g/dL — ABNORMAL LOW (ref 13.0–17.0)
MCH: 29.2 pg (ref 26.0–34.0)
MCHC: 31.4 g/dL (ref 30.0–36.0)
MCV: 93 fL (ref 80.0–100.0)
Platelets: 310 10*3/uL (ref 150–400)
RBC: 3.01 MIL/uL — ABNORMAL LOW (ref 4.22–5.81)
RDW: 15.2 % (ref 11.5–15.5)
WBC: 8.6 10*3/uL (ref 4.0–10.5)
nRBC: 0 % (ref 0.0–0.2)

## 2018-05-13 LAB — GLUCOSE, CAPILLARY
GLUCOSE-CAPILLARY: 118 mg/dL — AB (ref 70–99)
Glucose-Capillary: 86 mg/dL (ref 70–99)

## 2018-05-13 MED ORDER — METOPROLOL TARTRATE 100 MG PO TABS: 100.0000 mg | ORAL_TABLET | Freq: Two times a day (BID) | ORAL | Status: DC

## 2018-05-13 NOTE — Discharge Summary (Addendum)
[name] NAME: Matthew Zamora    MR#:  [id number]  DATE OF BIRTH:  September 06, 1940  DATE OF ADMISSION:  04/26/2018   ADMITTING PHYSICIAN: Amelia Jo, MD  DATE OF DISCHARGE: 05/13/2018  PRIMARY CARE PHYSICIAN: Maryland Pink, MD   ADMISSION DIAGNOSIS:  Acute respiratory failure with hypoxia and hypercapnia ([name]) [J96.01, J96.02] Chronic kidney disease, unspecified CKD stage [N18.9] Community acquired pneumonia, unspecified laterality [J18.9] Sepsis, due to unspecified organism, unspecified whether acute organ dysfunction present ([place]) [A41.9] DISCHARGE DIAGNOSIS:  Active Problems:   Acute respiratory failure ([name])   Paroxysmal atrial fibrillation ([place])  SECONDARY DIAGNOSIS:   Past Medical History:  Diagnosis Date  . Abdominal aortic aneurysm (AAA) ([place]) 05/13/15   seen on ct scan  . 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 Anemia   . 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 CAD (coronary artery disease)   . Cataract cortical, senile   . CHF (congestive heart failure) ([place])   . Chronic hoarseness   . Exocrine pancreatic insufficiency   . H. pylori infection   . History of hepatitis   . Hyperlipidemia   . Hypertension   . 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 Prostate CA [facility])    HOSPITAL COURSE:  77 y.o. male with a known history of CAD, CKD3, pacemaker, HTN, anemia admitted for septic shock due to pneumonia.  1. Status post septic shock secondary to pneumonia: completed Abx course.   2. Acute hypoxic respiratory failure with respiratory acidosis: resolved.  Weaned off oxygen via nasal cannula N.C. currently on room air. 3.  Atrial fibrillation with rapid ventricular response.  Echocardiogram showed an EF of 40 to 45%.   - Not a good candidate for anticoagulation givensevere anemia - rate controlled on Toprol XL  3. Acute metabolic encephalopathy resolved 4. Elevated troponin secondary to demand ischemia  with septic shock.  On aspirin. Outpatient ischemic cardiac work-up as per cardiology 5. Acute kidney injury on chronic kidney disease stage III. - at baseline 6. Pancreatic insufficiency on pancreatic enzymes supplementation 7. Anemia of chronic disease: stable 8. Hyperlipidemia unspecified on Lipitor 9. History of prostate cancer 10. Acute on chronic systolic CHF: EF 55-73%, Lasix with potassium supplementation orally 11. Abdominal aortic aneurysm: outpt f/up with vascular surgery 12. Secondary hyperparathyroidism: mgmt per Nephro 13. Anasarca - resolved. Diuresed while in the Hospital 14. Ambulatory dysfunction and gait instability -PT/OT recommends STR/SNF and patient/family prefers same. Decided [facility]. DISCHARGE CONDITIONS:  stable CONSULTS OBTAINED:  Treatment Team:  Efrain Sella, MD Lavonia Dana, MD Anthonette Legato, MD DRUG ALLERGIES:  No Known Allergies DISCHARGE MEDICATIONS:   Allergies as of 05/13/2018   No Known Allergies     Medication List    STOP taking these medications   metoprolol succinate 100 MG 24 hr tablet Commonly known as:  TOPROL-XL     TAKE these medications   aspirin EC 81 MG tablet Take 81 mg by mouth daily.   atorvastatin 40 MG tablet Commonly known as:  LIPITOR Take 40 mg by mouth daily.   clopidogrel 75 MG tablet Commonly known as:  PLAVIX Take 75 mg by mouth daily.   furosemide 40 MG tablet Commonly known as:  LASIX Take 20 mg by mouth daily.   lipase/protease/amylase 12000 units Cpep capsule Commonly known as:  CREON Take 24,000 Units by mouth 2 (two) times daily at 10 am and 4 pm.   lisinopril 5 MG tablet Commonly known as:  PRINIVIL,ZESTRIL Take 5 mg by mouth daily.   magnesium 84 MG (7MEQ) Tbcr SR tablet Commonly known as:  MAGTAB Take 84 mg by mouth daily.   Melatonin 1 MG Tabs Take 1 tablet by mouth at bedtime as needed.   metoprolol tartrate 100 MG tablet Commonly known as:  LOPRESSOR Take 1  tablet (100 mg total) by mouth 2 (two) times daily.   multivitamin with minerals tablet Take 1 tablet by mouth daily.   omeprazole 20 MG capsule Commonly known as:  PRILOSEC Take 20 mg by mouth daily.   oxybutynin 15 MG 24 hr tablet Commonly known as:  DITROPAN XL Take 15 mg by mouth at bedtime.      DISCHARGE INSTRUCTIONS:   DIET:  Regular diet DISCHARGE CONDITION:  Good ACTIVITY:  Activity as tolerated OXYGEN:  Home Oxygen: No.  Oxygen Delivery: room air DISCHARGE LOCATION:  nursing home   If you experience worsening of your admission symptoms, develop shortness of breath, life threatening emergency, suicidal or homicidal thoughts you must seek medical attention immediately by calling 911 or calling your MD immediately  if symptoms less severe.  You Must read complete instructions/literature along with all the possible adverse reactions/side effects for all the Medicines you take and that have been prescribed to you. Take any new Medicines after you have completely understood and accpet all the possible adverse reactions/side effects.   Please note  You were cared for by a hospitalist during your hospital stay. If you have any questions about your discharge medications or the care you received while you were in the hospital after you are discharged, you can call the unit and asked to speak with the hospitalist on call if the hospitalist that took care of you is not available. Once you are discharged, your primary care physician will handle any further medical issues. Please note that NO REFILLS for any discharge medications will be authorized once you are discharged, as it is imperative that you return to your primary care physician (or establish a relationship with a primary care physician if you do not have one) for your aftercare needs so that they can reassess your need for medications and monitor your lab values.    On the day of Discharge:  VITAL SIGNS:  Blood  pressure 115/71, pulse 64, temperature 98.5 F (36.9 C), temperature source Oral, resp. rate 19, height 5\' 11"  (1.803 m), weight 67.4 kg, SpO2 100 %. PHYSICAL EXAMINATION:  GENERAL:  77 y.o.-year-old patient lying in the bed with no acute distress.  EYES: Pupils equal, round, reactive to light and accommodation. No scleral icterus. Extraocular muscles intact.  HEENT: Head atraumatic, normocephalic. Oropharynx and nasopharynx clear.  NECK:  Supple, no jugular venous distention. No thyroid enlargement, no tenderness.  LUNGS: Normal breath sounds bilaterally, no wheezing, rales,rhonchi or crepitation. No use of accessory muscles of respiration.  CARDIOVASCULAR: S1, S2 normal. No murmurs, rubs, or gallops.  ABDOMEN: Soft, non-tender, non-distended. Bowel sounds present. No organomegaly or mass.  EXTREMITIES: No pedal edema, cyanosis, or clubbing.  NEUROLOGIC: Cranial nerves II through XII are intact. Muscle strength 5/5 in all extremities. Sensation intact. Gait not checked.  PSYCHIATRIC: The patient is alert and oriented x 3.  SKIN: No obvious rash, lesion, or ulcer.  DATA REVIEW:   CBC Recent Labs  Lab 05/13/18 0312  WBC 8.6  HGB 8.8*  HCT 28.0*  PLT 310    Chemistries  Recent Labs  Lab 05/07/18 0429  05/13/18 0312  NA 142   < >  139  K 3.9   < > 3.9  CL 107   < > 106  CO2 28   < > 25  GLUCOSE 113*   < > 88  BUN 53*   < > 34*  CREATININE 1.68*   < > 1.44*  CALCIUM 8.7*   < > 8.7*  MG 2.7*  --   --   AST 137*  --   --   ALT 106*  --   --   ALKPHOS 47  --   --   BILITOT 0.7  --   --    < > = values in this interval not displayed.      Contact information for follow-up providers    Maryland Pink, MD. Schedule an appointment as soon as possible for a visit in 5 days.   Specialty:  Family Medicine Why:  Appointment Time: @ office will call Contact information: [address] [facility] [place] [address] [phone]            Contact information  for after-discharge care    Destination    [facility] Preferred SNF.   Service:  Skilled Nursing Why:  SNF Contact information: [address] [place] [phone]                   Management plans discussed with the patient, family and they are in agreement.  CODE STATUS: Full Code   TOTAL TIME TAKING CARE OF THIS PATIENT: 45 minutes.    Max Sane M.D on 05/13/2018 at 11:55 AM  Between 7am to 6pm - Pager - [phone]  After 6pm go to [email or web address] - Proofreader  [facility]  Office  [phone]  CC: Primary care physician; Maryland Pink, MD   Note: This dictation was prepared with Dragon dictation along with smaller phrase technology. Any transcriptional errors that result from this process are unintentional.

## 2018-05-13 NOTE — Progress Notes (Signed)
Speech Therapy note: ensured aspiration precautions signs were posted in room. Pt is recommended to be on a Dysphagia level 3 diet w/ Thin - NO STRAWS and taking some po's per pt/family and NSG report but not much at times. Pt is challenged by his weakness overall. Dietitian is following.  MD to reconsult if any decline in pt's status warranting further skilled ST services.    Orinda Kenner, [place], CCC-SLP

## 2018-05-13 NOTE — Care Management Important Message (Signed)
Copy of signed IM left with patient in room.  

## 2018-05-13 NOTE — Clinical Social Work Note (Addendum)
CSW received phone call that insurance has approved patient to go to SNF for short term rehab.  Patient to be d/c'ed today to [facility] room 802.  Patient and family agreeable to plans will transport via ems RN to call report to [phone].  CSW left message on son [name]'s voice mail, [phone].  Patient's son Juanda Crumble was at bedside and aware of patient discharging today.   Evette Cristal, MSW, [place]

## 2018-05-13 NOTE — Plan of Care (Signed)
Generalized weakness continues.  Max assist required to sit on side of bed.

## 2018-05-13 NOTE — Progress Notes (Signed)
Patient discharged to [facility] as ordered,report called to receiving nurse,transported by EMS.Vital signs within normal limits upon discharge

## 2018-05-16 LAB — ABO/RH: ABO/RH(D): AB NEG

## 2018-05-26 ENCOUNTER — Other Ambulatory Visit (INDEPENDENT_AMBULATORY_CARE_PROVIDER_SITE_OTHER): Payer: Self-pay | Admitting: Vascular Surgery

## 2018-05-26 DIAGNOSIS — I714 Abdominal aortic aneurysm, without rupture, unspecified: Secondary | ICD-10-CM

## 2018-05-27 ENCOUNTER — Ambulatory Visit (INDEPENDENT_AMBULATORY_CARE_PROVIDER_SITE_OTHER): Payer: Medicare Other | Admitting: Vascular Surgery

## 2018-05-27 ENCOUNTER — Other Ambulatory Visit (INDEPENDENT_AMBULATORY_CARE_PROVIDER_SITE_OTHER): Payer: Medicare Other

## 2018-06-24 ENCOUNTER — Other Ambulatory Visit (INDEPENDENT_AMBULATORY_CARE_PROVIDER_SITE_OTHER): Payer: Medicare Other

## 2018-06-24 ENCOUNTER — Ambulatory Visit (INDEPENDENT_AMBULATORY_CARE_PROVIDER_SITE_OTHER): Payer: Medicare Other | Admitting: Vascular Surgery

## 2018-07-01 ENCOUNTER — Ambulatory Visit (INDEPENDENT_AMBULATORY_CARE_PROVIDER_SITE_OTHER): Payer: Medicare Other

## 2018-07-01 ENCOUNTER — Ambulatory Visit (INDEPENDENT_AMBULATORY_CARE_PROVIDER_SITE_OTHER): Payer: Medicare Other | Admitting: Vascular Surgery

## 2018-07-01 ENCOUNTER — Encounter (INDEPENDENT_AMBULATORY_CARE_PROVIDER_SITE_OTHER): Payer: Self-pay | Admitting: Vascular Surgery

## 2018-07-01 ENCOUNTER — Encounter

## 2018-07-01 VITALS — BP 140/85 | HR 80 | Resp 16 | Ht 69.0 in | Wt 144.8 lb

## 2018-07-01 DIAGNOSIS — I714 Abdominal aortic aneurysm, without rupture, unspecified: Secondary | ICD-10-CM

## 2018-07-01 DIAGNOSIS — E785 Hyperlipidemia, unspecified: Secondary | ICD-10-CM

## 2018-07-01 DIAGNOSIS — I10 Essential (primary) hypertension: Secondary | ICD-10-CM

## 2018-07-06 ENCOUNTER — Encounter (INDEPENDENT_AMBULATORY_CARE_PROVIDER_SITE_OTHER): Payer: Self-pay | Admitting: Vascular Surgery

## 2018-07-06 DIAGNOSIS — I714 Abdominal aortic aneurysm, without rupture, unspecified: Secondary | ICD-10-CM | POA: Insufficient documentation

## 2018-07-06 DIAGNOSIS — I10 Essential (primary) hypertension: Secondary | ICD-10-CM | POA: Insufficient documentation

## 2018-07-06 DIAGNOSIS — E785 Hyperlipidemia, unspecified: Secondary | ICD-10-CM | POA: Insufficient documentation

## 2018-07-06 NOTE — Progress Notes (Signed)
Subjective:    Patient ID: Matthew Zamora, male    DOB: 1940-08-15, 78 y.o.   MRN: [id number] Chief Complaint  Patient presents with  . Follow-up   Presents as a new patient after being seen in consult at [facility] for AAA.  The patient presents today without complaint.  Patient denies any abdominal pain, back pain or thrombosis bilateral lower extremity.  Patient denies any claudication-like symptoms, rest pain or ulcer formation to the bilateral legs.  The patient underwent a abdominal aortic duplex which was notable for triphasic waveforms throughout the aorta external and internal iliacs.  There is evidence of abnormal dilatation of the mid abdominal aorta measuring 3.2 cm at the largest aspect.  Patient denies any shortness of breath or chest pain.  Patient denies any fever, nausea vomiting.  Review of Systems  Constitutional: Negative.   HENT: Negative.   Eyes: Negative.   Respiratory: Negative.   Cardiovascular: Negative.   Gastrointestinal: Negative.   Endocrine: Negative.   Genitourinary: Negative.   Musculoskeletal: Negative.   Skin: Negative.   Allergic/Immunologic: Negative.   Neurological: Negative.   Hematological: Negative.   Psychiatric/Behavioral: Negative.       Objective:   Physical Exam Vitals signs reviewed.  Constitutional:      Appearance: Normal appearance. He is normal weight.  HENT:     Head: Normocephalic and atraumatic.     Right Ear: External ear normal.     Left Ear: External ear normal.     Nose: Nose normal.     Mouth/Throat:     Mouth: Mucous membranes are moist.     Pharynx: Oropharynx is clear.  Eyes:     Extraocular Movements: Extraocular movements intact.     Conjunctiva/sclera: Conjunctivae normal.     Pupils: Pupils are equal, round, and reactive to light.  Neck:     Musculoskeletal: Normal range of motion.  Cardiovascular:     Rate and Rhythm: Normal rate.     Pulses: Normal pulses.  Pulmonary:   Effort: Pulmonary effort is normal.     Breath sounds: Normal breath sounds.  Abdominal:     General: Abdomen is flat. Bowel sounds are normal.     Palpations: Abdomen is soft.  Musculoskeletal: Normal range of motion.        General: Swelling (Mild bilateral lower extremity nonpitting edema) present.  Skin:    General: Skin is warm and dry.  Neurological:     General: No focal deficit present.     Mental Status: He is alert and oriented to person, place, and time. Mental status is at baseline.  Psychiatric:        Mood and Affect: Mood normal.        Behavior: Behavior normal.        Thought Content: Thought content normal.        Judgment: Judgment normal.    BP 140/85 (BP Location: Left Arm, Patient Position: Sitting, Cuff Size: Normal)   Pulse 80   Resp 16   Ht 5\' 9"  (1.753 m)   Wt 144 lb 12.8 oz (65.7 kg)   BMI 21.38 kg/m   Past Medical History:  Diagnosis Date  . Abdominal aortic aneurysm (AAA) ([place]) 05/13/15   seen on ct scan  . 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 Anemia   . 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 CAD (coronary artery disease)   . Cataract cortical, senile   . CHF (congestive heart failure) (  [name])   . Chronic hoarseness   . Exocrine pancreatic insufficiency   . H. pylori infection   . History of hepatitis   . Hyperlipidemia   . Hypertension   . 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 Prostate CA [facility])    Social History   Socioeconomic History  . Marital status: Single    Spouse name: Not on file  . Number of children: Not on file  . Years of education: Not on file  . Highest education level: Not on file  Occupational History  . Not on file  Social Needs  . Financial resource strain: Not on file  . Food insecurity:    Worry: Not on file    Inability: Not on file  . Transportation needs:    Medical: Not on file    Non-medical: Not on file  Tobacco Use  . Smoking status: Former Smoker    Packs/day: 1.00    Years: 38.00     Pack years: 38.00    Types: Cigarettes    Last attempt to quit: 06/18/1999    Years since quitting: 19.0  . Smokeless tobacco: Never Used  Substance and Sexual Activity  . Alcohol use: No    Alcohol/week: 0.0 standard drinks  . Drug use: Not on file  . Sexual activity: Not on file  Lifestyle  . Physical activity:    Days per week: Not on file    Minutes per session: Not on file  . Stress: Not on file  Relationships  . Social connections:    Talks on phone: Not on file    Gets together: Not on file    Attends religious service: Not on file    Active member of club or organization: Not on file    Attends meetings of clubs or organizations: Not on file    Relationship status: Not on file  . Intimate partner violence:    Fear of current or ex partner: Not on file    Emotionally abused: Not on file    Physically abused: Not on file    Forced sexual activity: Not on file  Other Topics Concern  . Not on file  Social History Narrative  . Not on file   Past Surgical History:  Procedure Laterality Date  . CATARACT EXTRACTION    . COLONOSCOPY  10/06/2014, 09/18/2004, 03/14/2009  . ESOPHAGOGASTRODUODENOSCOPY  10/06/2014, 03/18/2001, 03/14/2009  . ESOPHAGOGASTRODUODENOSCOPY (EGD) WITH PROPOFOL N/A 05/07/2018   Procedure: ESOPHAGOGASTRODUODENOSCOPY (EGD) WITH PROPOFOL;  Surgeon: Toledo, [name], MD;  Location: [facility] ENDOSCOPY;  Service: Gastroenterology;  Laterality: N/A;  . FLEXIBLE SIGMOIDOSCOPY  08/26/1990  . INSERTION OF ICD    . PROSTATE SURGERY    . TONSILLECTOMY     Family History  Problem Relation Age of Onset  . Heart attack Mother   . Heart attack Father    No Known Allergies     Assessment & Plan:  Presents as a new patient after being seen in consult at [facility] for AAA.  The patient presents today without complaint.  Patient denies any abdominal pain, back pain or thrombosis bilateral lower extremity.  Patient denies any claudication-like  symptoms, rest pain or ulcer formation to the bilateral legs.  The patient underwent a abdominal aortic duplex which was notable for triphasic waveforms throughout the aorta external and internal iliacs.  There is evidence of abnormal dilatation of the mid abdominal aorta measuring 3.2 cm at the largest aspect.  Patient denies any shortness of breath  or chest pain.  Patient denies any fever, nausea vomiting.  1. AAA (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 ([name]) _ new Patient found to have a 3.2 cm AAA At this time the patient is asymptomatic Physical exam is unremarkable There is no indication for intervention at this time The patient is to follow-up in 6 months to assure there is no progression in size of his AAA.  If his AAA remains stable patient can follow-up on a yearly basis Encouraged control of the patient's hypertension and hyperlipidemia as these directly affect the progression in size of abdominal aortic aneurysms The patient and his family member who were present expressed their understanding  - VAS [place] AAA DUPLEX; Future  2. Essential hypertension - Stable On appropriate medications Encouraged good control as its slows the progression of atherosclerotic disease  3. Hyperlipidemia, unspecified hyperlipidemia type On ASA and statin On appropriate medications Encouraged good control as its slows the progression of atherosclerotic disease  Current Outpatient Medications on File Prior to Visit  Medication Sig Dispense Refill  . aspirin EC 81 MG tablet Take 81 mg by mouth daily.    [name] atorvastatin (LIPITOR) 40 MG tablet Take 40 mg by mouth daily.    . clopidogrel (PLAVIX) 75 MG tablet Take 75 mg by mouth daily.  5  . furosemide (LASIX) 40 MG tablet Take 20 mg by mouth daily.    . lipase/protease/amylase (CREON) 12000 units CPEP capsule Take 24,000 Units by mouth 2 (two) times daily at 10 am and 4 pm.    . lisinopril (PRINIVIL,ZESTRIL) 5 MG tablet Take 5 mg by mouth daily.    .  magnesium (MAGTAB) 84 MG (7MEQ) TBCR SR tablet Take 84 mg by mouth daily.    . Melatonin 1 MG TABS Take 1 tablet by mouth at bedtime as needed.    . metoprolol tartrate (LOPRESSOR) 100 MG tablet Take 1 tablet (100 mg total) by mouth 2 (two) times daily.    . Multiple Vitamins-Minerals (MULTIVITAMIN WITH MINERALS) tablet Take 1 tablet by mouth daily.    [name] omeprazole (PRILOSEC) 20 MG capsule Take 20 mg by mouth daily.    [name] oxybutynin (DITROPAN XL) 15 MG 24 hr tablet Take 15 mg by mouth at bedtime.     No current facility-administered medications on file prior to visit.    There are no Patient Instructions on file for this visit. No follow-ups on file.  Shaylin Blatt A Ambra Haverstick, PA-C

## 2018-09-17 ENCOUNTER — Other Ambulatory Visit: Payer: Self-pay

## 2018-09-17 ENCOUNTER — Emergency Department: Payer: Medicare Other

## 2018-09-17 ENCOUNTER — Inpatient Hospital Stay
Admission: EM | Admit: 2018-09-17 | Discharge: 2018-09-21 | DRG: 871 | Disposition: A | Discharge status: Home Health | Payer: Medicare Other | Attending: Internal Medicine | Admitting: Internal Medicine

## 2018-09-17 ENCOUNTER — Encounter: Payer: Self-pay | Admitting: Emergency Medicine

## 2018-09-17 DIAGNOSIS — A419 Sepsis, unspecified organism: Secondary | ICD-10-CM | POA: Diagnosis present

## 2018-09-17 DIAGNOSIS — K7689 Other specified diseases of liver: Secondary | ICD-10-CM | POA: Diagnosis present

## 2018-09-17 DIAGNOSIS — Z79899 Other long term (current) drug therapy: Secondary | ICD-10-CM | POA: Diagnosis not present

## 2018-09-17 DIAGNOSIS — J9621 Acute and chronic respiratory failure with hypoxia: Secondary | ICD-10-CM | POA: Diagnosis present

## 2018-09-17 DIAGNOSIS — I472 Ventricular tachycardia: Secondary | ICD-10-CM | POA: Diagnosis present

## 2018-09-17 DIAGNOSIS — Z7902 Long term (current) use of antithrombotics/antiplatelets: Secondary | ICD-10-CM

## 2018-09-17 DIAGNOSIS — J189 Pneumonia, unspecified organism: Secondary | ICD-10-CM | POA: Diagnosis present

## 2018-09-17 DIAGNOSIS — A4102 Sepsis due to Methicillin resistant Staphylococcus aureus: Principal | ICD-10-CM | POA: Diagnosis present

## 2018-09-17 DIAGNOSIS — R490 Dysphonia: Secondary | ICD-10-CM | POA: Diagnosis present

## 2018-09-17 DIAGNOSIS — I2510 Atherosclerotic heart disease of native coronary artery without angina pectoris: Secondary | ICD-10-CM | POA: Diagnosis present

## 2018-09-17 DIAGNOSIS — Z8546 Personal history of malignant neoplasm of prostate: Secondary | ICD-10-CM

## 2018-09-17 DIAGNOSIS — E875 Hyperkalemia: Secondary | ICD-10-CM | POA: Diagnosis present

## 2018-09-17 DIAGNOSIS — Z87891 Personal history of nicotine dependence: Secondary | ICD-10-CM

## 2018-09-17 DIAGNOSIS — I480 Paroxysmal atrial fibrillation: Secondary | ICD-10-CM | POA: Diagnosis present

## 2018-09-17 DIAGNOSIS — N183 Chronic kidney disease, stage 3 (moderate): Secondary | ICD-10-CM | POA: Diagnosis present

## 2018-09-17 DIAGNOSIS — K8681 Exocrine pancreatic insufficiency: Secondary | ICD-10-CM | POA: Diagnosis present

## 2018-09-17 DIAGNOSIS — Z7982 Long term (current) use of aspirin: Secondary | ICD-10-CM | POA: Diagnosis not present

## 2018-09-17 DIAGNOSIS — J9600 Acute respiratory failure, unspecified whether with hypoxia or hypercapnia: Secondary | ICD-10-CM

## 2018-09-17 DIAGNOSIS — Z03818 Encounter for observation for suspected exposure to other biological agents ruled out: Secondary | ICD-10-CM

## 2018-09-17 DIAGNOSIS — I714 Abdominal aortic aneurysm, without rupture: Secondary | ICD-10-CM | POA: Diagnosis present

## 2018-09-17 DIAGNOSIS — Z86010 Personal history of colonic polyps: Secondary | ICD-10-CM

## 2018-09-17 DIAGNOSIS — R6521 Severe sepsis with septic shock: Secondary | ICD-10-CM | POA: Diagnosis present

## 2018-09-17 DIAGNOSIS — Z9981 Dependence on supplemental oxygen: Secondary | ICD-10-CM

## 2018-09-17 DIAGNOSIS — I130 Hypertensive heart and chronic kidney disease with heart failure and stage 1 through stage 4 chronic kidney disease, or unspecified chronic kidney disease: Secondary | ICD-10-CM | POA: Diagnosis present

## 2018-09-17 DIAGNOSIS — E785 Hyperlipidemia, unspecified: Secondary | ICD-10-CM | POA: Diagnosis present

## 2018-09-17 DIAGNOSIS — N170 Acute kidney failure with tubular necrosis: Secondary | ICD-10-CM | POA: Diagnosis present

## 2018-09-17 DIAGNOSIS — I5023 Acute on chronic systolic (congestive) heart failure: Secondary | ICD-10-CM | POA: Diagnosis present

## 2018-09-17 DIAGNOSIS — Z8249 Family history of ischemic heart disease and other diseases of the circulatory system: Secondary | ICD-10-CM

## 2018-09-17 DIAGNOSIS — Z20828 Contact with and (suspected) exposure to other viral communicable diseases: Secondary | ICD-10-CM

## 2018-09-17 DIAGNOSIS — E872 Acidosis: Secondary | ICD-10-CM | POA: Diagnosis present

## 2018-09-17 DIAGNOSIS — J9601 Acute respiratory failure with hypoxia: Secondary | ICD-10-CM | POA: Diagnosis present

## 2018-09-17 HISTORY — DX: Paroxysmal atrial fibrillation: I48.0

## 2018-09-17 LAB — URINALYSIS, COMPLETE (UACMP) WITH MICROSCOPIC
Bilirubin Urine: NEGATIVE
Glucose, UA: NEGATIVE mg/dL
Hgb urine dipstick: NEGATIVE
Ketones, ur: NEGATIVE mg/dL
Nitrite: NEGATIVE
Protein, ur: NEGATIVE mg/dL
Specific Gravity, Urine: 1.02 (ref 1.005–1.030)
Squamous Epithelial / HPF: NONE SEEN (ref 0–5)
pH: 5 (ref 5.0–8.0)

## 2018-09-17 LAB — CBC WITH DIFFERENTIAL/PLATELET
Abs Immature Granulocytes: 0.06 10*3/uL (ref 0.00–0.07)
Basophils Absolute: 0.1 10*3/uL (ref 0.0–0.1)
Basophils Relative: 1 %
Eosinophils Absolute: 0.6 10*3/uL — ABNORMAL HIGH (ref 0.0–0.5)
Eosinophils Relative: 4 %
HCT: 40.8 % (ref 39.0–52.0)
Hemoglobin: 13 g/dL (ref 13.0–17.0)
Immature Granulocytes: 1 %
Lymphocytes Relative: 32 %
Lymphs Abs: 4.1 10*3/uL — ABNORMAL HIGH (ref 0.7–4.0)
MCH: 29.1 pg (ref 26.0–34.0)
MCHC: 31.9 g/dL (ref 30.0–36.0)
MCV: 91.3 fL (ref 80.0–100.0)
Monocytes Absolute: 1.3 10*3/uL — ABNORMAL HIGH (ref 0.1–1.0)
Monocytes Relative: 10 %
Neutro Abs: 6.9 10*3/uL (ref 1.7–7.7)
Neutrophils Relative %: 52 %
Platelets: 292 10*3/uL (ref 150–400)
RBC: 4.47 MIL/uL (ref 4.22–5.81)
RDW: 13.7 % (ref 11.5–15.5)
WBC: 13 10*3/uL — ABNORMAL HIGH (ref 4.0–10.5)
nRBC: 0 % (ref 0.0–0.2)

## 2018-09-17 LAB — COMPREHENSIVE METABOLIC PANEL
ALT: 12 U/L (ref 0–44)
AST: 34 U/L (ref 15–41)
Albumin: 4 g/dL (ref 3.5–5.0)
Alkaline Phosphatase: 59 U/L (ref 38–126)
Anion gap: 12 (ref 5–15)
BUN: 21 mg/dL (ref 8–23)
CO2: 22 mmol/L (ref 22–32)
Calcium: 9.2 mg/dL (ref 8.9–10.3)
Chloride: 99 mmol/L (ref 98–111)
Creatinine, Ser: 1.93 mg/dL — ABNORMAL HIGH (ref 0.61–1.24)
GFR calc Af Amer: 38 mL/min — ABNORMAL LOW (ref >60)
GFR calc non Af Amer: 33 mL/min — ABNORMAL LOW (ref >60)
Glucose, Bld: 243 mg/dL — ABNORMAL HIGH (ref 70–99)
Potassium: 5.2 mmol/L — ABNORMAL HIGH (ref 3.5–5.1)
Sodium: 133 mmol/L — ABNORMAL LOW (ref 135–145)
Total Bilirubin: 0.8 mg/dL (ref 0.3–1.2)
Total Protein: 7.2 g/dL (ref 6.5–8.1)

## 2018-09-17 LAB — PROCALCITONIN: Procalcitonin: <0.1 ng/mL

## 2018-09-17 LAB — BLOOD GAS, VENOUS
Acid-base deficit: 5.6 mmol/L — ABNORMAL HIGH (ref 0.0–2.0)
Bicarbonate: 25.7 mmol/L (ref 20.0–28.0)
FIO2: 100
MECHVT: 500 mL
O2 Saturation: 91.5 %
PEEP: 7 cmH2O
Patient temperature: 37
RATE: 15 resp/min
pCO2, Ven: 81 mmHg (ref 44.0–60.0)
pH, Ven: 7.11 — CL (ref 7.250–7.430)
pO2, Ven: 83 mmHg — ABNORMAL HIGH (ref 32.0–45.0)

## 2018-09-17 LAB — BLOOD GAS, ARTERIAL
Acid-base deficit: 5.5 mmol/L — ABNORMAL HIGH (ref 0.0–2.0)
Bicarbonate: 22.9 mmol/L (ref 20.0–28.0)
FIO2: 0.5
MECHVT: 420 mL
O2 Saturation: 93.7 %
PEEP: 5 cmH2O
Patient temperature: 35.4
RATE: 18 resp/min
pCO2 arterial: 56 mmHg — ABNORMAL HIGH (ref 32.0–48.0)
pH, Arterial: 7.24 — ABNORMAL LOW (ref 7.350–7.450)
pO2, Arterial: 75 mmHg — ABNORMAL LOW (ref 83.0–108.0)

## 2018-09-17 LAB — TROPONIN I: Troponin I: <0.03 ng/mL (ref <0.03)

## 2018-09-17 LAB — LACTIC ACID, PLASMA
Lactic Acid, Venous: 2.9 mmol/L (ref 0.5–1.9)
Lactic Acid, Venous: 1.3 mmol/L (ref 0.5–1.9)

## 2018-09-17 LAB — GLUCOSE, CAPILLARY
Glucose-Capillary: 128 mg/dL — ABNORMAL HIGH (ref 70–99)
Glucose-Capillary: 96 mg/dL (ref 70–99)
Glucose-Capillary: 68 mg/dL — ABNORMAL LOW (ref 70–99)

## 2018-09-17 LAB — BRAIN NATRIURETIC PEPTIDE: B Natriuretic Peptide: 1466 pg/mL — ABNORMAL HIGH (ref 0.0–100.0)

## 2018-09-17 LAB — PROTIME-INR
INR: 1 (ref 0.8–1.2)
Prothrombin Time: 13.1 seconds (ref 11.4–15.2)

## 2018-09-17 LAB — INFLUENZA PANEL BY PCR (TYPE A & B)
Influenza A By PCR: NEGATIVE
Influenza B By PCR: NEGATIVE

## 2018-09-17 LAB — MRSA PCR SCREENING: MRSA by PCR: POSITIVE — AB

## 2018-09-17 MED ORDER — ENOXAPARIN SODIUM 40 MG/0.4ML ~~LOC~~ SOLN
40.0000 mg | SUBCUTANEOUS | Status: DC
  Administered 2018-09-17 – 2018-09-20 (×4): 40 mg via SUBCUTANEOUS
  Filled 2018-09-17 (×4): qty 0.4

## 2018-09-17 MED ORDER — NOREPINEPHRINE 4 MG/250ML-% IV SOLN
INTRAVENOUS | Status: AC
  Administered 2018-09-17: 2 ug/min via INTRAVENOUS
  Filled 2018-09-17: qty 250

## 2018-09-17 MED ORDER — IPRATROPIUM-ALBUTEROL 0.5-2.5 (3) MG/3ML IN SOLN
3.0000 mL | Freq: Once | RESPIRATORY_TRACT | Status: AC
  Administered 2018-09-17: 3 mL via RESPIRATORY_TRACT
  Filled 2018-09-17: qty 3

## 2018-09-17 MED ORDER — ATORVASTATIN CALCIUM 20 MG PO TABS
40.0000 mg | ORAL_TABLET | ORAL | Status: DC
  Administered 2018-09-17 – 2018-09-20 (×4): 40 mg via ORAL
  Filled 2018-09-17 (×4): qty 2

## 2018-09-17 MED ORDER — ONDANSETRON HCL 4 MG PO TABS: 4.0000 mg | ORAL_TABLET | Freq: Four times a day (QID) | ORAL | Status: DC | PRN

## 2018-09-17 MED ORDER — ONDANSETRON HCL 4 MG/2ML IJ SOLN
4.0000 mg | Freq: Four times a day (QID) | INTRAMUSCULAR | Status: DC | PRN
  Administered 2018-09-19 – 2018-09-20 (×2): 4 mg via INTRAVENOUS
  Filled 2018-09-17 (×2): qty 2

## 2018-09-17 MED ORDER — NITROGLYCERIN IN D5W 200-5 MCG/ML-% IV SOLN
INTRAVENOUS | Status: AC
  Filled 2018-09-17: qty 250

## 2018-09-17 MED ORDER — FENTANYL CITRATE (PF) 100 MCG/2ML IJ SOLN
INTRAMUSCULAR | Status: AC
  Administered 2018-09-17: 100 ug via INTRAVENOUS
  Filled 2018-09-17: qty 2

## 2018-09-17 MED ORDER — ROCURONIUM BROMIDE 50 MG/5ML IV SOLN
100.0000 mg | Freq: Once | INTRAVENOUS | Status: AC
  Administered 2018-09-17: 100 mg via INTRAVENOUS
  Filled 2018-09-17: qty 10

## 2018-09-17 MED ORDER — POLYETHYLENE GLYCOL 3350 17 G PO PACK: 17.0000 g | PACK | Freq: Every day | ORAL | Status: DC | PRN

## 2018-09-17 MED ORDER — ASPIRIN 81 MG PO CHEW
81.0000 mg | CHEWABLE_TABLET | ORAL | Status: DC
  Administered 2018-09-18 – 2018-09-21 (×4): 81 mg via ORAL
  Filled 2018-09-17 (×4): qty 1

## 2018-09-17 MED ORDER — FENTANYL CITRATE (PF) 100 MCG/2ML IJ SOLN: 100.0000 ug | Freq: Once | INTRAMUSCULAR | Status: DC

## 2018-09-17 MED ORDER — FENTANYL 2500MCG IN NS 250ML (10MCG/ML) PREMIX INFUSION
0.0000 ug/h | INTRAVENOUS | Status: DC
  Administered 2018-09-17: 50 ug/h via INTRAVENOUS
  Administered 2018-09-18: 200 ug/h via INTRAVENOUS
  Filled 2018-09-17: qty 250

## 2018-09-17 MED ORDER — DEXMEDETOMIDINE HCL IN NACL 400 MCG/100ML IV SOLN
0.4000 ug/kg/h | INTRAVENOUS | Status: DC
  Administered 2018-09-17: 1 ug/kg/h via INTRAVENOUS
  Administered 2018-09-17: 0.4 ug/kg/h via INTRAVENOUS
  Administered 2018-09-18: 1.2 ug/kg/h via INTRAVENOUS
  Administered 2018-09-18: 0.5 ug/kg/h via INTRAVENOUS
  Administered 2018-09-18: 1 ug/kg/h via INTRAVENOUS
  Administered 2018-09-18: 1.2 ug/kg/h via INTRAVENOUS
  Administered 2018-09-19: 0.5 ug/kg/h via INTRAVENOUS
  Filled 2018-09-17 (×7): qty 100

## 2018-09-17 MED ORDER — SODIUM CHLORIDE 0.9 % IV BOLUS
1000.0000 mL | Freq: Once | INTRAVENOUS | Status: AC
  Administered 2018-09-17: 1000 mL via INTRAVENOUS

## 2018-09-17 MED ORDER — FENTANYL CITRATE (PF) 100 MCG/2ML IJ SOLN
100.0000 ug | Freq: Once | INTRAMUSCULAR | Status: AC
  Administered 2018-09-17: 100 ug via INTRAVENOUS

## 2018-09-17 MED ORDER — SODIUM CHLORIDE 0.9 % IV SOLN
500.0000 mg | Freq: Once | INTRAVENOUS | Status: AC
  Administered 2018-09-17: 500 mg via INTRAVENOUS
  Filled 2018-09-17: qty 500

## 2018-09-17 MED ORDER — ETOMIDATE 2 MG/ML IV SOLN
30.0000 mg | Freq: Once | INTRAVENOUS | Status: AC
  Administered 2018-09-17: 30 mg via INTRAVENOUS

## 2018-09-17 MED ORDER — NOREPINEPHRINE 4 MG/250ML-% IV SOLN
0.0000 ug/min | INTRAVENOUS | Status: DC
  Administered 2018-09-17: 09:00:00 2 ug/min via INTRAVENOUS
  Administered 2018-09-17: 5 ug/min via INTRAVENOUS
  Administered 2018-09-17: 8 ug/min via INTRAVENOUS
  Administered 2018-09-18: 5 ug/min via INTRAVENOUS
  Administered 2018-09-18: 6 ug/min via INTRAVENOUS
  Filled 2018-09-17 (×3): qty 250

## 2018-09-17 MED ORDER — INSULIN ASPART 100 UNIT/ML ~~LOC~~ SOLN
0.0000 [IU] | SUBCUTANEOUS | Status: DC
  Filled 2018-09-17 (×3): qty 1

## 2018-09-17 MED ORDER — PANTOPRAZOLE SODIUM 40 MG PO PACK
40.0000 mg | PACK | ORAL | Status: DC
  Administered 2018-09-17 – 2018-09-19 (×3): 40 mg
  Filled 2018-09-17 (×3): qty 20

## 2018-09-17 MED ORDER — DEXMEDETOMIDINE HCL IN NACL 400 MCG/100ML IV SOLN: 0.4000 ug/kg/h | INTRAVENOUS | Status: DC

## 2018-09-17 MED ORDER — SODIUM CHLORIDE 0.9 % IV SOLN: INTRAVENOUS | Status: DC

## 2018-09-17 MED ORDER — SODIUM CHLORIDE 0.9 % IV SOLN
2.0000 g | Freq: Two times a day (BID) | INTRAVENOUS | Status: DC
  Administered 2018-09-17 – 2018-09-20 (×6): 2 g via INTRAVENOUS
  Filled 2018-09-17 (×8): qty 2

## 2018-09-17 MED ORDER — CLOPIDOGREL BISULFATE 75 MG PO TABS
75.0000 mg | ORAL_TABLET | ORAL | Status: DC
  Administered 2018-09-18 – 2018-09-21 (×4): 75 mg
  Filled 2018-09-17 (×4): qty 1

## 2018-09-17 MED ORDER — ACETAMINOPHEN 325 MG PO TABS: 650.0000 mg | ORAL_TABLET | Freq: Four times a day (QID) | ORAL | Status: DC | PRN

## 2018-09-17 MED ORDER — FUROSEMIDE 10 MG/ML IJ SOLN: 40.0000 mg | Freq: Two times a day (BID) | INTRAMUSCULAR | Status: DC

## 2018-09-17 MED ORDER — FENTANYL 2500MCG IN NS 250ML (10MCG/ML) PREMIX INFUSION
INTRAVENOUS | Status: AC
  Administered 2018-09-17: 50 ug/h via INTRAVENOUS
  Filled 2018-09-17: qty 250

## 2018-09-17 MED ORDER — SODIUM CHLORIDE 0.9 % IV SOLN
1.0000 g | Freq: Once | INTRAVENOUS | Status: AC
  Administered 2018-09-17: 1 g via INTRAVENOUS
  Filled 2018-09-17: qty 10

## 2018-09-17 MED ORDER — ACETAMINOPHEN 650 MG RE SUPP: 650.0000 mg | Freq: Four times a day (QID) | RECTAL | Status: DC | PRN

## 2018-09-17 NOTE — ED Notes (Signed)
MD at bedside to prepare to intubate. Per MD VORB for 30mg  Etomidate and 100mg  Rocc.

## 2018-09-17 NOTE — ED Notes (Signed)
MD aware pt remains hypotensive, VORB to  Titrate to 7.5 mcg/min

## 2018-09-17 NOTE — ED Triage Notes (Signed)
Pt presents to ED via [facility] with c/o resp distress, hx CHF with audible rales, that started this morning. Initially on CPAP with EMS, transferred to [facility] with mask prior to entering the building.     CBG 186, temp 98.6 axl, 85% on 2L [place] chronic, 83% on CPAP,

## 2018-09-17 NOTE — ED Notes (Signed)
Pt once again became hypotensive, MD aware. Restarted Levophed at 36mcg/min.

## 2018-09-17 NOTE — Progress Notes (Signed)
Pharmacy Antibiotic Note  Matthew Zamora is a 78 y.o. male admitted on 09/17/2018 with pneumonia.  Pharmacy has been consulted for cefepime dosing.  Plan: Cefepime 2g IV q12h ordered based on current renal function  Height: 6\' 2"  (188 cm) Weight: 176 lb 5.9 oz (80 kg)(per MD ) IBW/kg (Calculated) : 82.2  Temp (24hrs), Avg:96.5 F (35.8 C), Min:96.4 F (35.8 C), Max:96.6 F (35.9 C)  Recent Labs  Lab 09/17/18 0839  WBC 13.0*  CREATININE 1.93*  LATICACIDVEN 2.9*    Estimated Creatinine Clearance: 36.8 mL/min (A) (by C-G formula based on SCr of 1.93 mg/dL (H)).    No Known Allergies  Antimicrobials this admission: Cefepime 4/2 >>  Dose adjustments this admission:   Microbiology results: 4/2 BCx: collected 4/2 MRSA PCR: ordered 4/2 COVID-19 test collected  Thank you for allowing pharmacy to be a part of this patient's care.  Rayna Sexton L 09/17/2018 10:00 AM

## 2018-09-17 NOTE — Progress Notes (Signed)
Temperature normalized bair hugger removed.

## 2018-09-17 NOTE — ED Notes (Signed)
Pt noted to continue to be hypothermic despite having warm blankets, more warm blankets applied to patient at this time.

## 2018-09-17 NOTE — ED Notes (Signed)
Warm blankets placed on patient due to gradual decline in temperature.

## 2018-09-17 NOTE — ED Notes (Signed)
MD aware of pt's BP. VORB for Levophed to 12mcg/min.

## 2018-09-17 NOTE — Progress Notes (Signed)
Patient accepted from ED sedated with precedex on ventilator. Blood pressure stable upon arrival on levophed at 47mcg.  Patient hypothermic bair hugger applied.

## 2018-09-17 NOTE — ED Notes (Signed)
Pt intubated with 7.5ET tube 24 at the teeth by Dr. Perfecto Kingdom, RT at bedside, this RN, and Earlie Lou, RN at bedside.

## 2018-09-17 NOTE — ED Notes (Signed)
Pt noted to be become hypertensive with increased HR. Spoke with MD regarding patient and patient potentially becoming more aware. Per MD stop pressors, leave precedex at 0.64mcg/kg/hr, give 2nd NS bolus, and give 167mcg Fentanyl IV push for sedation.

## 2018-09-17 NOTE — Consult Note (Signed)
CRITICAL CARE NOTE      CHIEF COMPLAINT:   Acute hypoxic respiratory failure   HPI   This is a 78 yo male with a hx of CHF with EF45-50, CAD, PAF, CKD3 came to ED with respiratory failure was found to be hypoxic and subsequently intubated in ED. Vitals are significant for hypothermia at 95.0, BP 82/59.    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  CXR with bilateral airspace disease in apicobasal gradient and blunted CPA. He is a novel corona rule out. VBG with acidemia pH 7.11. Spoke to eldest son Segundo Makela , states patient had never recovered to baseline from previous admission with CHF/pna.  He was apparently at home yesterday prior to admission and was not coughing or having flu like symptoms.  PAST MEDICAL HISTORY   Past Medical History:  Diagnosis Date  . Abdominal aortic aneurysm (AAA) ([place]) 05/13/15   seen on ct scan  . 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 Anemia   . 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 CAD (coronary artery disease)   . Cataract cortical, senile   . CHF (congestive heart failure) ([place])   . Chronic hoarseness   . Exocrine pancreatic insufficiency   . H. pylori infection   . History of hepatitis   . Hyperlipidemia   . Hypertension   . 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 PAF (paroxysmal atrial fibrillation) ([place])   . Prostate CA [facility])      SURGICAL HISTORY   Past Surgical History:  Procedure Laterality Date  . CATARACT EXTRACTION    . COLONOSCOPY  10/06/2014, 09/18/2004, 03/14/2009  . ESOPHAGOGASTRODUODENOSCOPY  10/06/2014, 03/18/2001, 03/14/2009  . ESOPHAGOGASTRODUODENOSCOPY (EGD) WITH PROPOFOL N/A 05/07/2018    Procedure: ESOPHAGOGASTRODUODENOSCOPY (EGD) WITH PROPOFOL;  Surgeon: Toledo, [name], MD;  Location: [facility] ENDOSCOPY;  Service: Gastroenterology;  Laterality: N/A;  . FLEXIBLE SIGMOIDOSCOPY  08/26/1990  . INSERTION OF ICD    . PROSTATE SURGERY    . TONSILLECTOMY       FAMILY HISTORY   Family History  Problem Relation Age of Onset  . Heart attack Mother   . Heart attack Father      SOCIAL HISTORY   Social History   Tobacco Use  . Smoking status: Former Smoker    Packs/day: 1.00    Years: 38.00    Pack years: 38.00    Types: Cigarettes    Last attempt to quit: 06/18/1999    Years since quitting: 19.2  . Smokeless tobacco: Never Used  Substance Use Topics  . Alcohol use: No    Alcohol/week: 0.0 standard drinks  . Drug use: Not on file     MEDICATIONS   Current Medication:  Current Facility-Administered Medications:  .  0.9 %  sodium chloride infusion, , Intravenous, Continuous, Mody, Sital, MD .  acetaminophen (TYLENOL) tablet 650 mg, 650 mg, Oral, Q6H PRN **OR** acetaminophen (TYLENOL) suppository 650 mg, 650 mg, Rectal, Q6H PRN, Mody, Sital, MD .  ceFEPIme (MAXIPIME) 2 g in sodium chloride 0.9 % 100 mL IVPB, 2 g, Intravenous, Q12H, Rocky Morel, RPH .  dexmedetomidine (PRECEDEX) 400 MCG/100ML (4 mcg/mL) infusion, 0.4-1.2 mcg/kg/hr, Intravenous, Titrated, Mody, Sital, MD, Last Rate: 8 mL/hr at 09/17/18 0840, 0.4 mcg/kg/hr at 09/17/18 0840 .  enoxaparin (LOVENOX) injection 40 mg, 40 mg, Subcutaneous, Q24H, Mody, Sital, MD .  furosemide (LASIX) injection  40 mg, 40 mg, Intravenous, BID, Mody, Sital, MD .  insulin aspart (novoLOG) injection 0-15 Units, 0-15 Units, Subcutaneous, Q4H, Khamiya Varin, MD .  nitroGLYCERIN 0.2 mg/mL in dextrose 5 % infusion, , , ,  .  norepinephrine (LEVOPHED) '4mg'$  in 286m premix infusion, 0-40 mcg/min, Intravenous, Continuous, Mody, Sital, MD, Last Rate: 28.1 mL/hr at 09/17/18 1028, 7.5 mcg/min at 09/17/18 1028 .  ondansetron (ZOFRAN)  tablet 4 mg, 4 mg, Oral, Q6H PRN **OR** ondansetron (ZOFRAN) injection 4 mg, 4 mg, Intravenous, Q6H PRN, Mody, Sital, MD .  polyethylene glycol (MIRALAX / GLYCOLAX) packet 17 g, 17 g, Oral, Daily PRN, MBettey Costa MD    ALLERGIES   Patient has no known allergies.    REVIEW OF SYSTEMS    10 system ROS unable to complete due to sedation on mechanical ventilation.   PHYSICAL EXAMINATION   Vitals:   09/17/18 1100 09/17/18 1105  BP: (!) 89/67 (!) 89/68  Pulse: 63 65  Resp: 18 18  Temp: (!) 95.1 F (35.1 C) (!) 95 F (35 C)  SpO2: 100% 100%    GENERAL:rass-1 HEAD: Normocephalic, atraumatic.  EYES: Pupils equal, round, reactive to light.  No scleral icterus.  MOUTH: Moist mucosal membrane. NECK: Supple. No thyromegaly. No nodules. No JVD.  PULMONARY: bilateral crackles without wheezing.  CARDIOVASCULAR: S1 and S2. Regular rate and rhythm. No murmurs, rubs, or gallops.  GASTROINTESTINAL: Soft, nontender, non-distended. No masses. Positive bowel sounds. No hepatosplenomegaly.  MUSCULOSKELETAL: No swelling, clubbing, or edema.  NEUROLOGIC: Mild distress due to acute illness SKIN:intact,warm,dry   LABS AND IMAGING         LAB RESULTS: Recent Labs  Lab 09/17/18 0839  NA 133*  K 5.2*  CL 99  CO2 22  BUN 21  CREATININE 1.93*  GLUCOSE 243*   Recent Labs  Lab 09/17/18 0839  HGB 13.0  HCT 40.8  WBC 13.0*  PLT 292     IMAGING RESULTS: Dg Abdomen 1 View  Result Date: 09/17/2018 CLINICAL DATA:  Bedside OG tube placement. EXAM: ABDOMEN - 1 VIEW COMPARISON:  04/30/2018 and earlier. FINDINGS: The tip of the gastric tube projects at the expected location of the proximal stomach, though the side hole is at or near the EG junction. The tube should be advanced several centimeters. Visualized UPPER abdominal bowel gas pattern unremarkable. IMPRESSION: The tip of the gastric tube in the proximal stomach, though the side hole is at or near the EG junction. Therefore, the  tube should be advanced several centimeters. Electronically Signed   By: TEvangeline DakinM.D.   On: 09/17/2018 09:42   Dg Chest Portable 1 View  Result Date: 09/17/2018 CLINICAL DATA:  78year old presenting with acute respiratory distress and shortness of breath that began this morning. Audible rales on clinical examination. Intubation. Current history of CHF. Former smoker. EXAM: PORTABLE CHEST 1 VIEW COMPARISON:  05/06/2018 and earlier. FINDINGS: Endotracheal tube tip in satisfactory position projecting approximately 5 cm above the carina. LEFT subclavian dual lead pacing defibrillator unchanged. Cardiac silhouette mildly enlarged, unchanged. Thoracic aorta tortuous and atherosclerotic, unchanged. Confluent airspace consolidation in the RIGHT lung base. Hazy and patchy airspace opacities elsewhere throughout both lungs. Mild pulmonary venous hypertension without evidence of interstitial pulmonary edema. No visible pleural effusions. IMPRESSION: 1. Endotracheal tube tip in satisfactory position projecting approximately 5 cm above the carina. 2. Pneumonia throughout both lungs, most confluent at the RIGHT lung base. 3. Stable mild cardiomegaly. Pulmonary venous hypertension without evidence of interstitial pulmonary edema. Electronically Signed  By: Evangeline Dakin M.D.   On: 09/17/2018 09:41   TTE Study Conclusions  - Left ventricle: The cavity size was normal. There was moderate   focal basal hypertrophy of the septum. Systolic function was   mildly to moderately reduced. The estimated ejection fraction was   in the range of 40% to 45%. Images were inadequate for LV wall   motion assessment. The study is not technically sufficient to   allow evaluation of LV diastolic function. - Mitral valve: There was mild regurgitation. - Left atrium: The atrium was moderately dilated. - Right atrium: The atrium was mildly dilated. - Pulmonary arteries: Systolic pressure was moderately increased.   PA  peak pressure: 50 mm Hg (S). - Inferior vena cava: The vessel was dilated. The respirophasic   diameter changes were blunted (< 50%), consistent with elevated   central venous pressure.  Microbiology results: 4/2 BCx: collected 4/2 MRSA PCR: ordered 4/2 COVID-19 test collected   ASSESSMENT AND PLAN       -Multidisciplinary rounds held today  Acute Hypoxic Respiratory Failure          Likely due to acute decompensated systolic CHF with EF 50-03% vs pulmonary related (Covid/other viral LRTI/ CAP) -continue Full MV support -continue Bronchodilator Therapy -Wean Fio2 and PEEP as tolerated -will perform SAT/SBT when respiratory parameters are met   CARDIAC FAILURE-         Systolic chf as above  -oxygen as needed -Lasix as tolerated -follow up cardiac enzymes as indicated ICU monitoring   Renal Failure-most likely due to ATN with CKD likey due to cardiorenal process -follow chem 7 -follow UO -continue Foley Catheter-assess need daily   NEUROLOGY - intubated and sedated - minimal sedation to achieve a RASS goal: -1 Wake up assessment pending   Possibly Septic shock       -lactate elevated, hypothermic , elevated wbc count, requiring vasopressor support       - cannot give aggressive fluid recussitation due to acutely decompendated CHF -septic workup pending -use vasopressors to keep MAP>65 -follow ABG and LA -follow up cultures -emperic ABX -consider stress dose steroids   ID -continue IV abx as prescibed -follow up cultures  GI/Nutrition GI PROPHYLAXIS as indicated DIET-->TF's as tolerated Constipation protocol as indicated  ENDO - ICU hypoglycemic\Hyperglycemia protocol -check FSBS per protocol   ELECTROLYTES -follow labs as needed -replace as needed -pharmacy consultation   DVT/GI PRX ordered -SCDs  TRANSFUSIONS AS NEEDED MONITOR FSBS ASSESS the need for LABS as needed   Critical care provider statement:    Critical care time  (minutes):  36   Critical care time was exclusive of:  Separately billable procedures and treating other patients   Critical care was necessary to treat or prevent imminent or life-threatening deterioration of the following conditions:  acute decompensated systolic chf exacerbation, possible septic shock , acute hypoxic respi failure, multiple medical comorbidities   Critical care was time spent personally by me on the following activities:  Development of treatment plan with patient or surrogate, discussions with consultants, evaluation of patient's response to treatment, examination of patient, obtaining history from patient or surrogate, ordering and performing treatments and interventions, ordering and review of laboratory studies and re-evaluation of patient's condition.  I assumed direction of critical care for this patient from another provider in my specialty: no    This document was prepared using Dragon voice recognition software and may include unintentional dictation errors.    Ottie Glazier, M.D.  Division of  Pulmonary & [place]

## 2018-09-17 NOTE — ED Notes (Signed)
OG tube advanced to 65 at the lip based off of DG abd.

## 2018-09-17 NOTE — ED Provider Notes (Signed)
[facility] Emergency Department Provider Note  ____________________________________________  Time seen: Approximately 9:22 AM  I have reviewed the triage vital signs and the nursing notes.   HISTORY  Chief Complaint Respiratory Distress  Level 5 caveat:  Portions of the history and physical were unable to be obtained due to respiratory distress   HPI Matthew Zamora is a 78 y.o. male with a history of CHF with EF of 40%, V. tach status post defibrillator, CAD who presents for evaluation of shortness of breath.  Patient has had progressively worsening shortness of breath for the last 2 days.  When EMS arrived patient was hypoxic to the 70s and was transferred to the hospital on CPAP.  Patient was transitioned to nonrebreather upon arrival for Covid precautions.  Patient denies fever cough or any exposure or travel to endemic regions of Covid. He lives at home.  Patient complaining of severe shortness of breath.    Past Medical History:  Diagnosis Date  . Abdominal aortic aneurysm (AAA) ([place]) 05/13/15   seen on ct scan  . 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 Anemia   . 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 CAD (coronary artery disease)   . Cataract cortical, senile   . CHF (congestive heart failure) ([place])   . Chronic hoarseness   . Exocrine pancreatic insufficiency   . H. pylori infection   . History of hepatitis   . Hyperlipidemia   . Hypertension   . 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 Prostate CA [facility])     Patient Active Problem List   Diagnosis Date Noted  . AAA (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 ([place]) 07/06/2018  . Essential hypertension 07/06/2018  . Hyperlipidemia 07/06/2018  . Paroxysmal atrial fibrillation ([name])   . Acute respiratory failure ([place]) 04/26/2018    Past Surgical History:  Procedure Laterality Date  . CATARACT EXTRACTION    . COLONOSCOPY  10/06/2014, 09/18/2004, 03/14/2009  .  ESOPHAGOGASTRODUODENOSCOPY  10/06/2014, 03/18/2001, 03/14/2009  . ESOPHAGOGASTRODUODENOSCOPY (EGD) WITH PROPOFOL N/A 05/07/2018   Procedure: ESOPHAGOGASTRODUODENOSCOPY (EGD) WITH PROPOFOL;  Surgeon: Toledo, [name], MD;  Location: [facility] ENDOSCOPY;  Service: Gastroenterology;  Laterality: N/A;  . FLEXIBLE SIGMOIDOSCOPY  08/26/1990  . INSERTION OF ICD    . PROSTATE SURGERY    . TONSILLECTOMY      Prior to Admission medications   Medication Sig Start Date End Date Taking? Authorizing Provider  aspirin EC 81 MG tablet Take 81 mg by mouth daily.    [provider]  atorvastatin (LIPITOR) 40 MG tablet Take 40 mg by mouth daily.    [provider]  clopidogrel (PLAVIX) 75 MG tablet Take 75 mg by mouth daily. 04/15/18   [provider]  furosemide (LASIX) 40 MG tablet Take 20 mg by mouth daily.    [provider]  lipase/protease/amylase (CREON) 12000 units CPEP capsule Take 24,000 Units by mouth 2 (two) times daily at 10 am and 4 pm.    [provider]  lisinopril (PRINIVIL,ZESTRIL) 5 MG tablet Take 5 mg by mouth daily.    [provider]  magnesium (MAGTAB) 84 MG (7MEQ) TBCR SR tablet Take 84 mg by mouth daily.    [provider]  Melatonin 1 MG TABS Take 1 tablet by mouth at bedtime as needed. 12/27/13   [provider]  metoprolol tartrate (LOPRESSOR) 100 MG tablet Take 1 tablet (100 mg total) by mouth 2 (two) times daily. 05/13/18   Max Sane, MD  Multiple  Vitamins-Minerals (MULTIVITAMIN WITH MINERALS) tablet Take 1 tablet by mouth daily.    [provider]  omeprazole (PRILOSEC) 20 MG capsule Take 20 mg by mouth daily.    [provider]  oxybutynin (DITROPAN XL) 15 MG 24 hr tablet Take 15 mg by mouth at bedtime.    [provider]    Allergies Patient has no known allergies.  Family History  Problem Relation Age of Onset  . Heart attack Mother   . Heart attack Father     Social  History Social History   Tobacco Use  . Smoking status: Former Smoker    Packs/day: 1.00    Years: 38.00    Pack years: 38.00    Types: Cigarettes    Last attempt to quit: 06/18/1999    Years since quitting: 19.2  . Smokeless tobacco: Never Used  Substance Use Topics  . Alcohol use: No    Alcohol/week: 0.0 standard drinks  . Drug use: Not on file    Review of Systems  Constitutional: Negative for fever. Cardiovascular: Negative for chest pain. Respiratory: + shortness of breath. Gastrointestinal: Negative for vomiting or diarrhea.  ____________________________________________   PHYSICAL EXAM:  VITAL SIGNS: ED Triage Vitals  Enc Vitals Group     BP 09/17/18 0815 (!) 163/112     Pulse Rate 09/17/18 0815 (!) 111     Resp 09/17/18 0815 (!) 27     Temp --      Temp src --      SpO2 09/17/18 0815 98 %     Weight 09/17/18 0816 176 lb 5.9 oz (80 kg)     Height 09/17/18 0816 6\' 2"  (1.88 m)     Head Circumference --      Peak Flow --      Pain Score --      Pain Loc --      Pain Edu? --      Excl. in [place]? --     Constitutional: Alert, severe respiratory distress HEENT:      Head: Normocephalic and atraumatic.         Eyes: Conjunctivae are normal. Sclera is non-icteric.       Mouth/Throat: Mucous membranes are moist.       Neck: Supple with no signs of meningismus. Cardiovascular: Tachycardic with regular rhythm. 2+ pulses x 4 Respiratory: Severely increased work of breathing, tachypneic, hypoxic, tripoding Gastrointestinal: Soft, non tender, and non distended with positive bowel sounds. No rebound or guarding. Musculoskeletal: No edema, cyanosis, or erythema of extremities. Neurologic: Normal speech and language. Face is symmetric. Moving all extremities. No gross focal neurologic deficits are appreciated. Skin: Skin is warm, dry and intact. No rash noted. Psychiatric: Mood and affect are normal. Speech and behavior are  normal.  ____________________________________________   LABS (all labs ordered are listed, but only abnormal results are displayed)  Labs Reviewed  COMPREHENSIVE METABOLIC PANEL - Abnormal; Notable for the following components:      Result Value   Sodium 133 (*)    Potassium 5.2 (*)    Glucose, Bld 243 (*)    Creatinine, Ser 1.93 (*)    GFR calc non Af Amer 33 (*)    GFR calc Af Amer 38 (*)    All other components within normal limits  LACTIC ACID, PLASMA - Abnormal; Notable for the following components:   Lactic Acid, Venous 2.9 (*)    All other components within normal limits  CBC WITH DIFFERENTIAL/PLATELET - Abnormal; Notable  for the following components:   WBC 13.0 (*)    Lymphs Abs 4.1 (*)    Monocytes Absolute 1.3 (*)    Eosinophils Absolute 0.6 (*)    All other components within normal limits  URINALYSIS, COMPLETE (UACMP) WITH MICROSCOPIC - Abnormal; Notable for the following components:   Color, Urine YELLOW (*)    APPearance HAZY (*)    Leukocytes,Ua SMALL (*)    Bacteria, UA RARE (*)    All other components within normal limits  BLOOD GAS, ARTERIAL - Abnormal; Notable for the following components:   pH, Arterial 7.23 (*)    pCO2 arterial 52 (*)    pO2, Arterial 137 (*)    Acid-base deficit 6.2 (*)    All other components within normal limits  BLOOD GAS, VENOUS - Abnormal; Notable for the following components:   pH, Ven 7.11 (*)    pCO2, Ven 81 (*)    pO2, Ven 83.0 (*)    Acid-base deficit 5.6 (*)    All other components within normal limits  CULTURE, BLOOD (ROUTINE X 2)  CULTURE, BLOOD (ROUTINE X 2)  NOVEL CORONAVIRUS, NAA (HOSPITAL ORDER, SEND-OUT TO REF LAB)  INFLUENZA PANEL BY PCR (TYPE A & B)  PROTIME-INR  TROPONIN I  LACTIC ACID, PLASMA   ____________________________________________  EKG  ED ECG REPORT I, Rudene Re, the attending physician, personally viewed and interpreted this ECG.  Sinus tachycardia, rate of 111, normal intervals,  normal axis, ST depressions in lateral leads with no ST elevation, anterior Q waves.  Unchanged from prior ____________________________________________  RADIOLOGY  I have personally reviewed the images performed during this visit and I agree with the Radiologist's read.   Interpretation by Radiologist:  Dg Abdomen 1 View  Result Date: 09/17/2018 CLINICAL DATA:  Bedside OG tube placement. EXAM: ABDOMEN - 1 VIEW COMPARISON:  04/30/2018 and earlier. FINDINGS: The tip of the gastric tube projects at the expected location of the proximal stomach, though the side hole is at or near the EG junction. The tube should be advanced several centimeters. Visualized UPPER abdominal bowel gas pattern unremarkable. IMPRESSION: The tip of the gastric tube in the proximal stomach, though the side hole is at or near the EG junction. Therefore, the tube should be advanced several centimeters. Electronically Signed   By: Evangeline Dakin M.D.   On: 09/17/2018 09:42   Dg Chest Portable 1 View  Result Date: 09/17/2018 CLINICAL DATA:  78 year old presenting with acute respiratory distress and shortness of breath that began this morning. Audible rales on clinical examination. Intubation. Current history of CHF. Former smoker. EXAM: PORTABLE CHEST 1 VIEW COMPARISON:  05/06/2018 and earlier. FINDINGS: Endotracheal tube tip in satisfactory position projecting approximately 5 cm above the carina. LEFT subclavian dual lead pacing defibrillator unchanged. Cardiac silhouette mildly enlarged, unchanged. Thoracic aorta tortuous and atherosclerotic, unchanged. Confluent airspace consolidation in the RIGHT lung base. Hazy and patchy airspace opacities elsewhere throughout both lungs. Mild pulmonary venous hypertension without evidence of interstitial pulmonary edema. No visible pleural effusions. IMPRESSION: 1. Endotracheal tube tip in satisfactory position projecting approximately 5 cm above the carina. 2. Pneumonia throughout both lungs,  most confluent at the RIGHT lung base. 3. Stable mild cardiomegaly. Pulmonary venous hypertension without evidence of interstitial pulmonary edema. Electronically Signed   By: Evangeline Dakin M.D.   On: 09/17/2018 09:41      ____________________________________________   PROCEDURES  Procedure(s) performed:yes Procedure Name: Intubation Date/Time: 09/17/2018 9:46 AM Performed by: Rudene Re, MD Pre-anesthesia Checklist: Patient  identified, Patient being monitored, Emergency Drugs available, Timeout performed and Suction available Oxygen Delivery Method: Non-rebreather mask Preoxygenation: Pre-oxygenation with 100% oxygen Induction Type: Rapid sequence Ventilation: Mask ventilation without difficulty Laryngoscope Size: Glidescope Tube size: 7.5 mm Number of attempts: 1 Airway Equipment and Method: Video-laryngoscopy Placement Confirmation: ETT inserted through vocal cords under direct vision,  CO2 detector and Breath sounds checked- equal and bilateral Secured at: 24 cm Tube secured with: ETT holder      Critical Care performed: yes  CRITICAL CARE Performed by: Rudene Re  ?  Total critical care time: 45 min  Critical care time was exclusive of separately billable procedures and treating other patients.  Critical care was necessary to treat or prevent imminent or life-threatening deterioration.  Critical care was time spent personally by me on the following activities: development of treatment plan with patient and/or surrogate as well as nursing, discussions with consultants, evaluation of patient's response to treatment, examination of patient, obtaining history from patient or surrogate, ordering and performing treatments and interventions, ordering and review of laboratory studies, ordering and review of radiographic studies, pulse oximetry and re-evaluation of patient's condition.  ____________________________________________   INITIAL IMPRESSION /  ASSESSMENT AND PLAN / ED COURSE   78 y.o. male with a history of CHF with EF of 40%, V. tach status post defibrillator, CAD who presents for evaluation of shortness of breath.  Patient arrives in severe respiratory distress borderline respiratory failure with significant increased work of breathing, tachypneic, tripoding.  Patient was maintaining his sats in the mid 90s with 15 L nonrebreather.  Patient started to tire out and due to safety concerns to avoid aerolizing particles, no BiPAP was available and patient had to be intubated.  I discussed with patient prior to intubation and he requested to be full code and requested intubation.  Patient was intubated using etomidate and rocuronium.  After intubation patient's blood pressure started trending down even after fluid resuscitation.  Therefore patient was started on norepinephrine.  Chest x-ray concerning for pneumonia, labs showing elevated white count and lactic.  Patient was started on Rocephin and azithromycin IV.  Patient is on Precedex for sedation.  Flu and Covid swabs were sent.  Presentation concerning for septic shock in the setting of pneumonia.  Discussed with Dr. Benjie Karvonen for admission.  I was able to speak with patient's oldest son Mr. Matthew Zamora and gave him an update of patient's critical condition.      As part of my medical decision making, I reviewed the following data within the [place] notes reviewed and incorporated, Labs reviewed , EKG interpreted , Old EKG reviewed, Old chart reviewed, Radiograph reviewed , Discussed with admitting physician , Notes from prior ED visits and [place] Controlled Substance Database    Pertinent labs & imaging results that were available during my care of the patient were reviewed by me and considered in my medical decision making (see chart for details).    ____________________________________________   FINAL CLINICAL IMPRESSION(S) / ED DIAGNOSES  Final  diagnoses:  Septic shock ([place])  Community acquired pneumonia, unspecified laterality      NEW MEDICATIONS STARTED DURING THIS VISIT:  ED Discharge Orders    None       Note:  This document was prepared using Dragon voice recognition software and may include unintentional dictation errors.    Alfred Levins, [place], MD 09/17/18 [phone]

## 2018-09-17 NOTE — H&P (Signed)
[place] [facility] [place] NAME: Matthew Zamora    MR#:  [id number]  DATE OF BIRTH:  01-Feb-1941  DATE OF ADMISSION:  09/17/2018  PRIMARY CARE PHYSICIAN: [place] Pink, MD   REQUESTING/REFERRING PHYSICIAN: dr Alfred Levins  CHIEF COMPLAINT:   SOB HISTORY OF PRESENT ILLNESS:  Matthew Zamora  is a 78 y.o. male with a known history of chronic systolic heart failure, CAD,PAF and CKD stage 3 who presented via EMS due to respiratory distress. When EMS arrived his O2 was in the 70'2 and was transferred to the hospital on CPAP. Patient was placed on covered precautions and was intubated by ED physician. Chest x-ray is concerning for bilateral pneumonia. He was given Rocephin and azithromycin  Patient is on Levophed and Precedex  He is hypothermic as well.  PAST MEDICAL HISTORY:   Past Medical History:  Diagnosis Date  . Abdominal aortic aneurysm (AAA) ([place]) 05/13/15   seen on ct scan  . 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 Anemia   . 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 CAD (coronary artery disease)   . Cataract cortical, senile   . CHF (congestive heart failure) ([place])   . Chronic hoarseness   . Exocrine pancreatic insufficiency   . H. pylori infection   . History of hepatitis   . Hyperlipidemia   . Hypertension   . 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 Prostate CA [facility])     PAST SURGICAL HISTORY:   Past Surgical History:  Procedure Laterality Date  . CATARACT EXTRACTION    . COLONOSCOPY  10/06/2014, 09/18/2004, 03/14/2009  . ESOPHAGOGASTRODUODENOSCOPY  10/06/2014, 03/18/2001, 03/14/2009  . ESOPHAGOGASTRODUODENOSCOPY (EGD) WITH PROPOFOL N/A 05/07/2018   Procedure: ESOPHAGOGASTRODUODENOSCOPY (EGD) WITH PROPOFOL;  Surgeon: Toledo, [name], MD;  Location: [facility] ENDOSCOPY;  Service: Gastroenterology;  Laterality: N/A;  . FLEXIBLE SIGMOIDOSCOPY  08/26/1990  . INSERTION OF ICD    . PROSTATE SURGERY    . TONSILLECTOMY       SOCIAL HISTORY:   Social History   Tobacco Use  . Smoking status: Former Smoker    Packs/day: 1.00    Years: 38.00    Pack years: 38.00    Types: Cigarettes    Last attempt to quit: 06/18/1999    Years since quitting: 19.2  . Smokeless tobacco: Never Used  Substance Use Topics  . Alcohol use: No    Alcohol/week: 0.0 standard drinks    FAMILY HISTORY:   Family History  Problem Relation Age of Onset  . Heart attack Mother   . Heart attack Father     DRUG ALLERGIES:  No Known Allergies  REVIEW OF SYSTEMS:   Review of Systems  Unable to perform ROS: Intubated    MEDICATIONS AT HOME:   Prior to Admission medications   Medication Sig Start Date End Date Taking? Authorizing Provider  aspirin EC 81 MG tablet Take 81 mg by mouth daily.    [provider]  atorvastatin (LIPITOR) 40 MG tablet Take 40 mg by mouth daily.    [provider]  clopidogrel (PLAVIX) 75 MG tablet Take 75 mg by mouth daily. 04/15/18   [provider]  furosemide (LASIX) 40 MG tablet Take 20 mg by mouth daily.    [provider]  lipase/protease/amylase (CREON) 12000 units CPEP capsule Take 24,000 Units by mouth 2 (two) times daily at 10 am and 4 pm.    [provider]  lisinopril (PRINIVIL,ZESTRIL) 5 MG tablet Take 5 mg by  mouth daily.    [provider]  magnesium (MAGTAB) 84 MG (7MEQ) TBCR SR tablet Take 84 mg by mouth daily.    [provider]  Melatonin 1 MG TABS Take 1 tablet by mouth at bedtime as needed. 12/27/13   [provider]  metoprolol tartrate (LOPRESSOR) 100 MG tablet Take 1 tablet (100 mg total) by mouth 2 (two) times daily. 05/13/18   Max Sane, MD  Multiple Vitamins-Minerals (MULTIVITAMIN WITH MINERALS) tablet Take 1 tablet by mouth daily.    [provider]  omeprazole (PRILOSEC) 20 MG capsule Take 20 mg by mouth daily.    [provider]  oxybutynin (DITROPAN XL) 15 MG 24 hr tablet Take 15  mg by mouth at bedtime.    [provider]      VITAL SIGNS:  Blood pressure 100/70, pulse 61, temperature (!) 96.4 F (35.8 C), resp. rate 18, height 6\' 2"  (1.88 m), weight 80 kg, SpO2 100 %.  PHYSICAL EXAMINATION:   Physical Exam Constitutional:      General: He is not in acute distress.    Appearance: He is ill-appearing.     Comments: Intubated sedated  HENT:     Head: Normocephalic.     Mouth/Throat:     Pharynx: No oropharyngeal exudate.  Eyes:     General: No scleral icterus.    Pupils: Pupils are equal, round, and reactive to light.  Neck:     Musculoskeletal: Normal range of motion and neck supple.     Vascular: No JVD.     Trachea: No tracheal deviation.  Cardiovascular:     Rate and Rhythm: Regular rhythm. Tachycardia present.     Heart sounds: Murmur present. No friction rub. No gallop.   Pulmonary:     Effort: Pulmonary effort is normal. No respiratory distress.     Breath sounds: Rhonchi and rales present. No wheezing.  Chest:     Chest wall: No tenderness.  Abdominal:     General: Bowel sounds are normal. There is no distension.     Palpations: Abdomen is soft. There is no mass.     Tenderness: There is no abdominal tenderness. There is no guarding or rebound.  Musculoskeletal:     Right lower leg: No edema.  Skin:    General: Skin is warm.     Findings: No erythema or rash.  Neurological:     Comments: Sedated on vent       LABORATORY PANEL:   CBC Recent Labs  Lab 09/17/18 0839  WBC 13.0*  HGB 13.0  HCT 40.8  PLT 292   ------------------------------------------------------------------------------------------------------------------  Chemistries  Recent Labs  Lab 09/17/18 0839  NA 133*  K 5.2*  CL 99  CO2 22  GLUCOSE 243*  BUN 21  CREATININE 1.93*  CALCIUM 9.2  AST 34  ALT 12  ALKPHOS 59  BILITOT 0.8    ------------------------------------------------------------------------------------------------------------------  Cardiac Enzymes Recent Labs  Lab 09/17/18 0845  TROPONINI <0.03   ------------------------------------------------------------------------------------------------------------------  RADIOLOGY:  Dg Abdomen 1 View  Result Date: 09/17/2018 CLINICAL DATA:  Bedside OG tube placement. EXAM: ABDOMEN - 1 VIEW COMPARISON:  04/30/2018 and earlier. FINDINGS: The tip of the gastric tube projects at the expected location of the proximal stomach, though the side hole is at or near the EG junction. The tube should be advanced several centimeters. Visualized UPPER abdominal bowel gas pattern unremarkable. IMPRESSION: The tip of the gastric tube in the proximal stomach, though the side hole is at  or near the EG junction. Therefore, the tube should be advanced several centimeters. Electronically Signed   By: Evangeline Dakin M.D.   On: 09/17/2018 09:42   Dg Chest Portable 1 View  Result Date: 09/17/2018 CLINICAL DATA:  78 year old presenting with acute respiratory distress and shortness of breath that began this morning. Audible rales on clinical examination. Intubation. Current history of CHF. Former smoker. EXAM: PORTABLE CHEST 1 VIEW COMPARISON:  05/06/2018 and earlier. FINDINGS: Endotracheal tube tip in satisfactory position projecting approximately 5 cm above the carina. LEFT subclavian dual lead pacing defibrillator unchanged. Cardiac silhouette mildly enlarged, unchanged. Thoracic aorta tortuous and atherosclerotic, unchanged. Confluent airspace consolidation in the RIGHT lung base. Hazy and patchy airspace opacities elsewhere throughout both lungs. Mild pulmonary venous hypertension without evidence of interstitial pulmonary edema. No visible pleural effusions. IMPRESSION: 1. Endotracheal tube tip in satisfactory position projecting approximately 5 cm above the carina. 2. Pneumonia throughout  both lungs, most confluent at the RIGHT lung base. 3. Stable mild cardiomegaly. Pulmonary venous hypertension without evidence of interstitial pulmonary edema. Electronically Signed   By: Evangeline Dakin M.D.   On: 09/17/2018 09:41    EKG:  Sinus tachy no ST elevateion ST depression lateral leads  IMPRESSION AND PLAN:   78 y/o male with chronic systolic heart failure ejection fraction 40 to 45%, PAF, chronic kidney disease stage III and CAD who presents via EMS due to respiratory distress  1.  Acute on chronic hypoxic respiratory failure with 2 L of oxygen at home: Patient intubated and sedated on vent. Acute hypoxic respiratory failure in the setting of pneumonia and acute on chronic systolic heart failure with rule out COVID 19. Intensivist consulted and discussed case  2.  Sepsis with leukocytosis, lactic acidosis, tachycardia and hypoxia/tachypnea due to pneumonia: Since the patient is intubated I will order cefepime. MRSA PCR pending Patient is being ruled out for COVID 19 with precautions droplet and contact as well as influenza Follow-up on COVID-19 testing as well as blood cultures. Pressors to keep MAP>65   3.  Acute on chronic systolic heart failure EF 40 to 45%: Lasix 40 mg IV twice daily Follow intake and output   4.  Hyperkalemia: Repeat potassium 5.  Chronic kidney disease stage III: Creatinine is near baseline  6.  PAF: Patient is currently in sinus tachycardia  All the records are reviewed and case discussed with ED provider.  Patient is critically ill and high risk for cardiopulmonary arrest.   D/w intensivist  CODE STATUS: FULL  Critical care TOTAL TIME TAKING CARE OF THIS PATIENT: 60 minutes.    Bettey Costa M.D on 09/17/2018 at 9:54 AM  Between 7am to 6pm - Pager - [phone]  After 6pm go to [email or web address] - password [email or web address]  Office  [phone]  CC: Primary care physician; [place] Pink, MD

## 2018-09-17 NOTE — ED Notes (Signed)
ED TO INPATIENT HANDOFF REPORT  ED Nurse Name and Phone #:  Jinny Blossom [place] Name/Age/Gender Matthew Zamora 78 y.o. male Room/Bed: ED06A/ED06A  Code Status   Code Status: Full Code  Home/SNF/Other Home UTA orientation questions, pt somnolent upon arrival to ED, pt then intubated Is this baseline? UTA  Triage Complete: Triage complete  Chief Complaint Resp Distress  Triage Note Pt presents to ED via [facility] with c/o resp distress, hx CHF with audible rales, that started this morning. Initially on CPAP with EMS, transferred to [facility] with mask prior to entering the building.     CBG 186, temp 98.6 axl, 85% on 2L [name], 83% on CPAP,    Allergies No Known Allergies  Level of Care/Admitting Diagnosis ED Disposition    ED Disposition Condition [facility] Area: [place] [[id number]]  Level of Care: ICU [6]  Diagnosis: Acute hypoxemic respiratory failure [facility]) [[id number]]  Admitting Physician: MODYUlice Bold [[id number]]  Attending Physician: MODY, Ulice Bold [[id number]]  Estimated length of stay: 5 - 7 days  Certification:: I certify this patient will need inpatient services for at least 2 midnights  Bed request comments: covid rule out  PT Class (Do Not Modify): Inpatient [101]  PT Acc Code (Do Not Modify): Private [1]       B Medical/Surgery History Past Medical History:  Diagnosis Date  . Abdominal aortic aneurysm (AAA) ([place]) 05/13/15   seen on ct scan  . 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 Anemia   . 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 CAD (coronary artery disease)   . Cataract cortical, senile   . CHF (congestive heart failure) ([place])   . Chronic hoarseness   . Exocrine pancreatic insufficiency   . H. pylori infection   . History of hepatitis   . Hyperlipidemia   . Hypertension   . 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 PAF (paroxysmal atrial fibrillation) ([place])   . Prostate CA [facility])    Past Surgical History:   Procedure Laterality Date  . CATARACT EXTRACTION    . COLONOSCOPY  10/06/2014, 09/18/2004, 03/14/2009  . ESOPHAGOGASTRODUODENOSCOPY  10/06/2014, 03/18/2001, 03/14/2009  . ESOPHAGOGASTRODUODENOSCOPY (EGD) WITH PROPOFOL N/A 05/07/2018   Procedure: ESOPHAGOGASTRODUODENOSCOPY (EGD) WITH PROPOFOL;  Surgeon: Toledo, [name], MD;  Location: [facility] ENDOSCOPY;  Service: Gastroenterology;  Laterality: N/A;  . FLEXIBLE SIGMOIDOSCOPY  08/26/1990  . INSERTION OF ICD    . PROSTATE SURGERY    . TONSILLECTOMY       A IV Location/Drains/Wounds Patient Lines/Drains/Airways Status   Active Line/Drains/Airways    Name:   Placement date:   Placement time:   Site:   Days:   Peripheral IV 09/17/18 Left Forearm   09/17/18    0815    Forearm   less than 1   Peripheral IV 09/17/18 Left Hand   09/17/18    0905    Hand   less than 1   Peripheral IV 09/17/18 Right Forearm   09/17/18    0957    Forearm   less than 1   Peripheral IV 09/17/18 Right;Lateral Antecubital   09/17/18    0959    Antecubital   less than 1   NG/OG Tube Orogastric 16 Fr. Center mouth Xray 65 cm   09/17/18    0903    Center mouth   less than 1   Airway 7.5 mm   09/17/18    0840     less than 1  Intake/Output Last 24 hours No intake or output data in the 24 hours ending 09/17/18 1023  Labs/Imaging Results for orders placed or performed during the hospital encounter of 09/17/18 (from the past 48 hour(s))  Urinalysis, Complete w Microscopic     Status: Abnormal   Collection Time: 09/17/18  8:33 AM  Result Value Ref Range   Color, Urine YELLOW (A) YELLOW   APPearance HAZY (A) CLEAR   Specific Gravity, Urine 1.020 1.005 - 1.030   pH 5.0 5.0 - 8.0   Glucose, UA NEGATIVE NEGATIVE mg/dL   Hgb urine dipstick NEGATIVE NEGATIVE   Bilirubin Urine NEGATIVE NEGATIVE   Ketones, ur NEGATIVE NEGATIVE mg/dL   Protein, ur NEGATIVE NEGATIVE mg/dL   Nitrite NEGATIVE NEGATIVE   Leukocytes,Ua SMALL (A) NEGATIVE   RBC / HPF 0-5 0 - 5 RBC/hpf    WBC, UA 21-50 0 - 5 WBC/hpf   Bacteria, UA RARE (A) NONE SEEN   Squamous Epithelial / LPF NONE SEEN 0 - 5   Mucus PRESENT    Hyaline Casts, UA PRESENT     Comment: Performed at [facility], [address] [place], [place] [address]  Influenza panel by PCR (type A & B)     Status: None   Collection Time: 09/17/18  8:37 AM  Result Value Ref Range   Influenza A By PCR NEGATIVE NEGATIVE   Influenza B By PCR NEGATIVE NEGATIVE    Comment: (NOTE) The Xpert Xpress Flu assay is intended as an aid in the diagnosis of  influenza and should not be used as a sole basis for treatment.  This  assay is FDA approved for nasopharyngeal swab specimens only. Nasal  washings and aspirates are unacceptable for Xpert Xpress Flu testing. Performed at [facility], [address] [place], [place] [address]   Comprehensive metabolic panel     Status: Abnormal   Collection Time: 09/17/18  8:39 AM  Result Value Ref Range   Sodium 133 (L) 135 - 145 mmol/L   Potassium 5.2 (H) 3.5 - 5.1 mmol/L    Comment: HEMOLYSIS AT THIS LEVEL MAY AFFECT RESULT   Chloride 99 98 - 111 mmol/L   CO2 22 22 - 32 mmol/L   Glucose, Bld 243 (H) 70 - 99 mg/dL   BUN 21 8 - 23 mg/dL   Creatinine, Ser 1.93 (H) 0.61 - 1.24 mg/dL   Calcium 9.2 8.9 - 10.3 mg/dL   Total Protein 7.2 6.5 - 8.1 g/dL   Albumin 4.0 3.5 - 5.0 g/dL   AST 34 15 - 41 U/L    Comment: HEMOLYSIS AT THIS LEVEL MAY AFFECT RESULT   ALT 12 0 - 44 U/L   Alkaline Phosphatase 59 38 - 126 U/L   Total Bilirubin 0.8 0.3 - 1.2 mg/dL    Comment: HEMOLYSIS AT THIS LEVEL MAY AFFECT RESULT   GFR calc non Af Amer 33 (L) >60 mL/min   GFR calc Af Amer 38 (L) >60 mL/min   Anion gap 12 5 - 15    Comment: Performed at [facility], [address] [place], [place] [address]  Lactic acid, plasma     Status: Abnormal   Collection Time: 09/17/18  8:39 AM  Result Value Ref Range   Lactic Acid, Venous 2.9 (HH) 0.5 - 1.9 mmol/L    Comment: CRITICAL  RESULT CALLED TO, READ BACK BY AND VERIFIED WITH Amybeth Sieg LEMONS @0924  09/17/2018 MU/HKP Performed at [facility], [address] [place], [place]  [address]   CBC with Differential     Status: Abnormal   Collection Time: 09/17/18  8:39 AM  Result Value Ref Range   WBC 13.0 (H) 4.0 - 10.5 K/uL   RBC 4.47 4.22 - 5.81 MIL/uL   Hemoglobin 13.0 13.0 - 17.0 g/dL   HCT 40.8 39.0 - 52.0 %   MCV 91.3 80.0 - 100.0 fL   MCH 29.1 26.0 - 34.0 pg   MCHC 31.9 30.0 - 36.0 g/dL   RDW 13.7 11.5 - 15.5 %   Platelets 292 150 - 400 K/uL   nRBC 0.0 0.0 - 0.2 %   Neutrophils Relative % 52 %   Neutro Abs 6.9 1.7 - 7.7 K/uL   Lymphocytes Relative 32 %   Lymphs Abs 4.1 (H) 0.7 - 4.0 K/uL   Monocytes Relative 10 %   Monocytes Absolute 1.3 (H) 0.1 - 1.0 K/uL   Eosinophils Relative 4 %   Eosinophils Absolute 0.6 (H) 0.0 - 0.5 K/uL   Basophils Relative 1 %   Basophils Absolute 0.1 0.0 - 0.1 K/uL   Immature Granulocytes 1 %   Abs Immature Granulocytes 0.06 0.00 - 0.07 K/uL    Comment: Performed at [facility], [address] [place], [place] [address]  Protime-INR     Status: None   Collection Time: 09/17/18  8:39 AM  Result Value Ref Range   Prothrombin Time 13.1 11.4 - 15.2 seconds   INR 1.0 0.8 - 1.2    Comment: (NOTE) INR goal varies based on device and disease states. Performed at [facility], [address] [place], [place] [address]   Troponin I - ONCE - STAT     Status: None   Collection Time: 09/17/18  8:45 AM  Result Value Ref Range   Troponin I <0.03 <0.03 ng/mL    Comment: Performed at [facility], [address] [place], [place] [address]  Blood gas, arterial     Status: Abnormal (Preliminary result)   Collection Time: 09/17/18  8:51 AM  Result Value Ref Range   FIO2 1.00    Delivery systems VENTILATOR    Mode PRESSURE REGULATED VOLUME CONTROL    VT 500 mL   Peep/cpap 5.0 cm H20   pH, Arterial 7.23 (L) 7.350 - 7.450   pCO2 arterial 52 (H)  32.0 - 48.0 mmHg   pO2, Arterial 137 (H) 83.0 - 108.0 mmHg   Bicarbonate 21.8 20.0 - 28.0 mmol/L   Acid-base deficit 6.2 (H) 0.0 - 2.0 mmol/L   O2 Saturation 98.6 %   Patient temperature 37.0    Collection site RIGHT RADIAL    Sample type ARTERIAL DRAW    Allens test (pass/fail) PASS PASS    Comment: Performed at [facility], [address] [place], [place] [address]   Mechanical Rate PENDING   Blood gas, venous     Status: Abnormal   Collection Time: 09/17/18  8:58 AM  Result Value Ref Range   FIO2 100.00    Delivery systems VENTILATOR    Mode PRESSURE REGULATED VOLUME CONTROL    VT 500 mL   LHR 15 resp/min   Peep/cpap 7.0 cm H20   pH, Ven 7.11 (LL) 7.250 - 7.430    Comment: CRITICAL RESULT CALLED TO, READ BACK BY AND VERIFIED WITH:  VERONESE AT 0905, 09/17/2018, LS    pCO2, Ven 81 (HH) 44.0 - 60.0 mmHg    Comment: CRITICAL RESULT CALLED TO, READ BACK BY AND VERIFIED WITH:  VERONESE AT 0905, 09/17/2018, LS    pO2, Ven 83.0 (H) 32.0 - 45.0 mmHg   Bicarbonate 25.7 20.0 - 28.0 mmol/L   Acid-base deficit 5.6 (H) 0.0 - 2.0 mmol/L   O2 Saturation 91.5 %   Patient temperature 37.0    Collection site VENOUS    Sample type VENOUS     Comment: Performed at [facility], [address] [place], [place] [address]   Dg Abdomen 1 View  Result Date: 09/17/2018 CLINICAL DATA:  Bedside OG tube placement. EXAM: ABDOMEN - 1 VIEW COMPARISON:  04/30/2018 and earlier. FINDINGS: The tip of the gastric tube projects at the expected location of the proximal stomach, though the side hole is at or near the EG junction. The tube should be advanced several centimeters. Visualized UPPER abdominal bowel gas pattern unremarkable. IMPRESSION: The tip of the gastric tube in the proximal stomach, though the side hole is at or near the EG junction. Therefore, the tube should be advanced several centimeters. Electronically Signed   By: Evangeline Dakin M.D.   On: 09/17/2018 09:42   Dg Chest  Portable 1 View  Result Date: 09/17/2018 CLINICAL DATA:  78 year old presenting with acute respiratory distress and shortness of breath that began this morning. Audible rales on clinical examination. Intubation. Current history of CHF. Former smoker. EXAM: PORTABLE CHEST 1 VIEW COMPARISON:  05/06/2018 and earlier. FINDINGS: Endotracheal tube tip in satisfactory position projecting approximately 5 cm above the carina. LEFT subclavian dual lead pacing defibrillator unchanged. Cardiac silhouette mildly enlarged, unchanged. Thoracic aorta tortuous and atherosclerotic, unchanged. Confluent airspace consolidation in the RIGHT lung base. Hazy and patchy airspace opacities elsewhere throughout both lungs. Mild pulmonary venous hypertension without evidence of interstitial pulmonary edema. No visible pleural effusions. IMPRESSION: 1. Endotracheal tube tip in satisfactory position projecting approximately 5 cm above the carina. 2. Pneumonia throughout both lungs, most confluent at the RIGHT lung base. 3. Stable mild cardiomegaly. Pulmonary venous hypertension without evidence of interstitial pulmonary edema. Electronically Signed   By: Evangeline Dakin M.D.   On: 09/17/2018 09:41    Pending Labs Unresulted Labs (From admission, onward)    Start     Ordered   09/24/18 0500  Creatinine, serum  (enoxaparin (LOVENOX)    CrCl >/= 30 ml/min)  Weekly,   STAT    Comments:  while on enoxaparin therapy    09/17/18 0953   09/18/18 6834  Basic metabolic panel  Tomorrow morning,   STAT     09/17/18 0953   09/18/18 0500  CBC  Tomorrow morning,   STAT     09/17/18 0953   09/17/18 1001  Brain natriuretic peptide  Once,   STAT     09/17/18 1000   09/17/18 0952  MRSA PCR Screening  Once,   STAT     09/17/18 0953   09/17/18 0844  Novel Coronavirus, NAA (hospital order; send-out to ref lab)  (Novel Coronavirus, NAA [facility] Order; send-out to ref lab) with precautions panel)  ONCE - STAT,   STAT    Question Answer Comment   Current symptoms Fever and Shortness of breath   Excluded other viral illnesses Yes   Exposure Risk None      09/17/18 0844   09/17/18 0839  Lactic acid, plasma  Now then every 2 hours,   STAT     09/17/18 0838   09/17/18 0839  Culture, blood (Routine x 2)  BLOOD CULTURE X 2,   STAT     09/17/18 1962  Vitals/Pain Today's Vitals   09/17/18 1005 09/17/18 1010 09/17/18 1015 09/17/18 1020  BP: (!) 166/113 110/66 (!) 74/52 (!) 78/57  Pulse: 87 73 66 64  Resp: 18 18 18  (!) 9  Temp: (!) 96 F (35.6 C) (!) 96 F (35.6 C) (!) 96 F (35.6 C) (!) 95.9 F (35.5 C)  SpO2: 97% 97% 98% 97%  Weight:      Height:        Isolation Precautions Droplet and Contact precautions  Medications Medications  nitroGLYCERIN 0.2 mg/mL in dextrose 5 % infusion (  Not Given 09/17/18 0939)  dexmedetomidine (PRECEDEX) 400 MCG/100ML (4 mcg/mL) infusion (0.4 mcg/kg/hr  80 kg Intravenous New Bag/Given 09/17/18 0840)  norepinephrine (LEVOPHED) 4mg  in 263mL premix infusion (5 mcg/min Intravenous Restarted 09/17/18 1017)  cefTRIAXone (ROCEPHIN) 1 g in sodium chloride 0.9 % 100 mL IVPB (1 g Intravenous New Bag/Given 09/17/18 0957)  azithromycin (ZITHROMAX) 500 mg in sodium chloride 0.9 % 250 mL IVPB (500 mg Intravenous New Bag/Given 09/17/18 1012)  enoxaparin (LOVENOX) injection 40 mg (has no administration in time range)  0.9 %  sodium chloride infusion (has no administration in time range)  acetaminophen (TYLENOL) tablet 650 mg (has no administration in time range)    Or  acetaminophen (TYLENOL) suppository 650 mg (has no administration in time range)  polyethylene glycol (MIRALAX / GLYCOLAX) packet 17 g (has no administration in time range)  ondansetron (ZOFRAN) tablet 4 mg (has no administration in time range)    Or  ondansetron (ZOFRAN) injection 4 mg (has no administration in time range)  ceFEPIme (MAXIPIME) 2 g in sodium chloride 0.9 % 100 mL IVPB (has no administration in time range)  furosemide  (LASIX) injection 40 mg (has no administration in time range)  etomidate (AMIDATE) injection 30 mg (30 mg Intravenous Given 09/17/18 0832)  rocuronium (ZEMURON) injection 100 mg (100 mg Intravenous Given 09/17/18 0832)  ipratropium-albuterol (DUONEB) 0.5-2.5 (3) MG/3ML nebulizer solution 3 mL (3 mLs Nebulization Given 09/17/18 0935)  sodium chloride 0.9 % bolus 1,000 mL (1,000 mLs Intravenous New Bag/Given 09/17/18 1003)  fentaNYL (SUBLIMAZE) injection 100 mcg (100 mcg Intravenous Given 09/17/18 1007)    Mobility UTA due to patient condition, somnolent upon arrival, intubated immediately after High fall risk   Focused Assessments see chart   R Recommendations: See Admitting Provider Note  Report given to:   Additional Notes:  Covid Rule out, flu neg, Levo and Precedex for sedation and hypotension. Initial Lactic 2.9. Initial OG 55 at lip, per DG abd needed advancement, advanced to 65 at lip.

## 2018-09-17 NOTE — ED Notes (Signed)
X-ray at bedside at this time.

## 2018-09-18 ENCOUNTER — Inpatient Hospital Stay: Payer: Medicare Other

## 2018-09-18 LAB — CBC
HCT: 33.8 % — ABNORMAL LOW (ref 39.0–52.0)
Hemoglobin: 10.5 g/dL — ABNORMAL LOW (ref 13.0–17.0)
MCH: 28.8 pg (ref 26.0–34.0)
MCHC: 31.1 g/dL (ref 30.0–36.0)
MCV: 92.6 fL (ref 80.0–100.0)
Platelets: 168 10*3/uL (ref 150–400)
RBC: 3.65 MIL/uL — ABNORMAL LOW (ref 4.22–5.81)
RDW: 14 % (ref 11.5–15.5)
WBC: 11.1 10*3/uL — ABNORMAL HIGH (ref 4.0–10.5)
nRBC: 0 % (ref 0.0–0.2)

## 2018-09-18 LAB — BASIC METABOLIC PANEL
Anion gap: 8 (ref 5–15)
BUN: 21 mg/dL (ref 8–23)
CO2: 24 mmol/L (ref 22–32)
Calcium: 8.3 mg/dL — ABNORMAL LOW (ref 8.9–10.3)
Chloride: 104 mmol/L (ref 98–111)
Creatinine, Ser: 1.46 mg/dL — ABNORMAL HIGH (ref 0.61–1.24)
GFR calc Af Amer: 53 mL/min — ABNORMAL LOW (ref >60)
GFR calc non Af Amer: 46 mL/min — ABNORMAL LOW (ref >60)
Glucose, Bld: 103 mg/dL — ABNORMAL HIGH (ref 70–99)
Potassium: 4.6 mmol/L (ref 3.5–5.1)
Sodium: 136 mmol/L (ref 135–145)

## 2018-09-18 LAB — BLOOD GAS, ARTERIAL
Acid-base deficit: 1.7 mmol/L (ref 0.0–2.0)
Bicarbonate: 25.2 mmol/L (ref 20.0–28.0)
FIO2: 0.5
MECHVT: 420 mL
Mechanical Rate: 18
O2 Saturation: 98.5 %
PEEP: 5 cmH2O
Patient temperature: 37.8
pCO2 arterial: 50 mmHg — ABNORMAL HIGH (ref 32.0–48.0)
pH, Arterial: 7.3 — ABNORMAL LOW (ref 7.350–7.450)
pO2, Arterial: 130 mmHg — ABNORMAL HIGH (ref 83.0–108.0)

## 2018-09-18 LAB — GLUCOSE, CAPILLARY
Glucose-Capillary: 81 mg/dL (ref 70–99)
Glucose-Capillary: 69 mg/dL — ABNORMAL LOW (ref 70–99)
Glucose-Capillary: 88 mg/dL (ref 70–99)
Glucose-Capillary: 101 mg/dL — ABNORMAL HIGH (ref 70–99)
Glucose-Capillary: 109 mg/dL — ABNORMAL HIGH (ref 70–99)
Glucose-Capillary: 93 mg/dL (ref 70–99)
Glucose-Capillary: 95 mg/dL (ref 70–99)

## 2018-09-18 LAB — PROCALCITONIN: Procalcitonin: 0.15 ng/mL

## 2018-09-18 LAB — MAGNESIUM: Magnesium: 2 mg/dL (ref 1.7–2.4)

## 2018-09-18 LAB — PHOSPHORUS: Phosphorus: 3.7 mg/dL (ref 2.5–4.6)

## 2018-09-18 MED ORDER — MUPIROCIN 2 % EX OINT
1.0000 "application " | TOPICAL_OINTMENT | Freq: Two times a day (BID) | CUTANEOUS | Status: DC
  Administered 2018-09-18 – 2018-09-21 (×7): 1 via NASAL
  Filled 2018-09-18: qty 22

## 2018-09-18 MED ORDER — SODIUM CHLORIDE 0.9 % IV SOLN
INTRAVENOUS | Status: DC | PRN
  Administered 2018-09-18 – 2018-09-19 (×3): 250 mL via INTRAVENOUS

## 2018-09-18 MED ORDER — LORAZEPAM 2 MG/ML IJ SOLN
1.0000 mg | INTRAMUSCULAR | Status: DC | PRN
  Administered 2018-09-18 (×2): 1 mg via INTRAVENOUS
  Filled 2018-09-18 (×2): qty 1

## 2018-09-18 MED ORDER — SODIUM CHLORIDE 0.9 % IV SOLN
100.0000 mg | Freq: Once | INTRAVENOUS | Status: AC
  Administered 2018-09-18: 100 mg via INTRAVENOUS
  Filled 2018-09-18: qty 100

## 2018-09-18 MED ORDER — SODIUM CHLORIDE 0.9 % IV SOLN
100.0000 mg | Freq: Two times a day (BID) | INTRAVENOUS | Status: DC
  Administered 2018-09-18 – 2018-09-19 (×3): 100 mg via INTRAVENOUS
  Filled 2018-09-18 (×5): qty 100

## 2018-09-18 MED ORDER — CHLORHEXIDINE GLUCONATE 0.12% ORAL RINSE (MEDLINE KIT)
15.0000 mL | Freq: Two times a day (BID) | OROMUCOSAL | Status: DC
  Administered 2018-09-18 – 2018-09-21 (×5): 15 mL via OROMUCOSAL

## 2018-09-18 MED ORDER — CHLORHEXIDINE GLUCONATE CLOTH 2 % EX PADS
6.0000 | MEDICATED_PAD | Freq: Every day | CUTANEOUS | Status: DC
  Administered 2018-09-18 – 2018-09-21 (×3): 6 via TOPICAL

## 2018-09-18 MED ORDER — ORAL CARE MOUTH RINSE
15.0000 mL | OROMUCOSAL | Status: DC
  Administered 2018-09-18 – 2018-09-19 (×7): 15 mL via OROMUCOSAL

## 2018-09-18 NOTE — Evaluation (Signed)
Clinical/Bedside Swallow Evaluation Patient Details  Name: Matthew Zamora MRN: [id number] Date of Birth: 1940/07/18  Today's Date: 09/18/2018 Time: SLP Start Time (ACUTE ONLY): 1500 SLP Stop Time (ACUTE ONLY): 1538 SLP Time Calculation (min) (ACUTE ONLY): 38 min  Past Medical History:  Past Medical History:  Diagnosis Date  . Abdominal aortic aneurysm (AAA) ([place]) 05/13/15   seen on ct scan  . 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 Anemia   . 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 CAD (coronary artery disease)   . Cataract cortical, senile   . CHF (congestive heart failure) ([place])   . Chronic hoarseness   . Exocrine pancreatic insufficiency   . H. pylori infection   . History of hepatitis   . Hyperlipidemia   . Hypertension   . 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 PAF (paroxysmal atrial fibrillation) ([place])   . Prostate CA [facility])    Past Surgical History:  Past Surgical History:  Procedure Laterality Date  . CATARACT EXTRACTION    . COLONOSCOPY  10/06/2014, 09/18/2004, 03/14/2009  . ESOPHAGOGASTRODUODENOSCOPY  10/06/2014, 03/18/2001, 03/14/2009  . ESOPHAGOGASTRODUODENOSCOPY (EGD) WITH PROPOFOL N/A 05/07/2018   Procedure: ESOPHAGOGASTRODUODENOSCOPY (EGD) WITH PROPOFOL;  Surgeon: Toledo, [name], MD;  Location: [facility] ENDOSCOPY;  Service: Gastroenterology;  Laterality: N/A;  . FLEXIBLE SIGMOIDOSCOPY  08/26/1990  . INSERTION OF ICD    . PROSTATE SURGERY    . TONSILLECTOMY     HPI:  H&P 09/17/2018: Matthew Zamora  is a 78 y.o. male with a known history of chronic systolic heart failure, CAD,PAF and CKD stage 3 who presented via EMS due to respiratory distress.  When EMS arrived his O2 was in the 70'2 and was transferred to the hospital on CPAP.  Patient was placed on covered precautions and was intubated by ED physician.  Chest x-ray is concerning for bilateral pneumonia.  He was given Rocephin and azithromycin  Patient is on Levophed and Precedex.  He is hypothermic as well.    Assessment / Plan / Recommendation Clinical Impression  This 78 year old man, intubated 24 hours and extubated this morning, is presenting with no overt clinical indicators of aspiration nectar-thick liquids and puree solids.  The patient had no cough or vocal quality change with PO trials.  The patient is pending Covid-19 testing so the patient was observed from the door as RN gave PO trials.  Recommend beginning a dysphagia 1 diet with nectar-thick liquid.  SLP will follow for diet upgrade as appropriate.    SLP Visit Diagnosis: Dysphagia, unspecified (R13.10)    Aspiration Risk  Mild aspiration risk    Diet Recommendation Dysphagia 1 (Puree);Nectar-thick liquid   Liquid Administration via: Spoon Medication Administration: Whole meds with puree Supervision: Full supervision/cueing for compensatory strategies Postural Changes: Seated upright at 90 degrees;Remain upright for at least 30 minutes after po intake    Other  Recommendations Oral Care Recommendations: Oral care QID   Follow up Recommendations Other (comment)(TBD)      Frequency and Duration min 3x week  2 weeks       Prognosis Prognosis for Safe Diet Advancement: Good      Swallow Study   General Date of Onset: 09/17/18 HPI: H&P 09/17/2018: Matthew Zamora  is a 78 y.o. male with a known history of chronic systolic heart failure, CAD,PAF and CKD stage 3 who presented via EMS due to respiratory distress.  When EMS arrived his O2 was in the 70'2 and was transferred to the hospital on CPAP.  Patient was  placed on covered precautions and was intubated by ED physician.  Chest x-ray is concerning for bilateral pneumonia.  He was given Rocephin and azithromycin  Patient is on Levophed and Precedex.  He is hypothermic as well. Type of Study: Bedside Swallow Evaluation Previous Swallow Assessment: November 2019 admission Diet Prior to this Study: NPO History of Recent Intubation: Yes Length of Intubations (days): 1  days Date extubated: 09/18/18 Behavior/Cognition: Alert;Cooperative;Pleasant mood;Confused;Distractible Patient Positioning: Upright in bed Baseline Vocal Quality: Hoarse    Oral/Motor/Sensory Function Overall Oral Motor/Sensory Function: Other (comment)(Cannot full assess- pt able to talk and take PO trials)   Ice Chips     Thin Liquid      Nectar Thick Nectar Thick Liquid: Within functional limits Presentation: Spoon   Honey Thick     Puree Puree: Within functional limits Presentation: Spoon   Solid           [name], MS/CCC- SLP  [name] 09/18/2018,3:39 PM
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
## 2018-09-18 NOTE — TOC Initial Note (Signed)
Transition of Care [facility]) - Initial/Assessment Note    Patient Details  Name: Matthew Zamora MRN: [id number] Date of Birth: Aug 08, 1940  Transition of [facility]) CM/SW Contact:    Shelbie Hutching, RN Phone Number: 09/18/2018, 9:25 AM  Clinical Narrative:                 Patient admitted with respiratory failure requiring intubation.  Patient currently intubated and sedated in the ICU.  Possible extubation today.  Patient is from home and lives alone and is independent and drives.  Patient has 3 sons, RNCM spoke with [name] who was a primary caregiver to the patient after his last inpatient admission.  Patient left the hospital and went to [facility] but [name] reports that he was only there for a week before he signed himself out and went back home.  Patient eventually got back to his baseline and has been independent for the last 2 months.   RNCM will follow patient progress and assist with any discharge planning.  Expected Discharge Plan: Skilled Nursing Facility Barriers to Discharge: Continued Medical Work up   Patient Goals and CMS Choice        Expected Discharge Plan and Services Expected Discharge Plan: [place]       Living arrangements for the past 2 months: Mobile Home                          Prior Living Arrangements/Services Living arrangements for the past 2 months: Mobile Home Lives with:: Self Patient language and need for interpreter reviewed:: Yes Do you feel safe going back to the place where you live?: Yes      Need for Family Participation in Patient Care: Yes (Comment)(lives alone) Care giver support system in place?: Yes (comment)(has 3 sons)   Criminal Activity/Legal Involvement Pertinent to Current Situation/Hospitalization: No - Comment as needed  Activities of Daily Living      Permission Sought/Granted                  Emotional Assessment Appearance:: Appears stated age Attitude/Demeanor/Rapport: Intubated  (Following Commands or Not Following Commands)     Alcohol / Substance Use: Not Applicable Psych Involvement: No (comment)  Admission diagnosis:  Septic shock ([place]) [A41.9, R65.21] Community acquired pneumonia, unspecified laterality [J18.9] Patient Active Problem List   Diagnosis Date Noted  . Acute hypoxemic respiratory failure ([place]) 09/17/2018  . AAA (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 ([place]) 07/06/2018  . Essential hypertension 07/06/2018  . Hyperlipidemia 07/06/2018  . Paroxysmal atrial fibrillation ([name])   . Acute respiratory failure ([place]) 04/26/2018   PCP:  [place] Pink, MD Pharmacy:   [facility] [address]), [place] [address] [place]) [place] [address] Phone: [phone] Fax: [phone]     Social Determinants of Health (SDOH) Interventions    Readmission Risk Interventions No flowsheet data found.

## 2018-09-18 NOTE — Progress Notes (Signed)
Patient in ICU on covered precautions.  Plan of care as per intensivist.

## 2018-09-18 NOTE — Progress Notes (Signed)
Inpatient Diabetes Program Recommendations  AACE/ADA: New Consensus Statement on Inpatient Glycemic Control (2015)  Target Ranges:  Prepandial:   less than 140 mg/dL      [facility] postprandial:   less than 180 mg/dL (1-2 hours)      Critically ill patients:  140 - 180 mg/dL   Results for Matthew Zamora, Matthew Zamora (MRN [id number]) as of 09/18/2018 11:03  Ref. Range 09/17/2018 11:43 09/17/2018 17:09 09/17/2018 19:49 09/18/2018 00:55 09/18/2018 04:04 09/18/2018 08:12  Glucose-Capillary Latest Ref Range: 70 - 99 mg/dL 128 (H) 96 68 (L) 81 69 (L) 88    Admit with: Resp Failure  NO History of Diabetes noted  Current Orders: Novolog Moderate Correction Scale/ SSI (0-15 units) Q4 hours     Extubated this AM.  Mild Hypoglycemia last night and again this AM.  NO Insulin given yet since admission.    MD- Since CBGs are all WNL (with the exception of 2 minor Hypoglycemic events) and Since patient does not have history of Diabetes, please consider d/c of Novolog SSi for now.     --Will follow patient during hospitalization--  Wyn Quaker RN, MSN, CDE Diabetes Coordinator Inpatient Glycemic Control Team Team Pager: [phone] (8a-5p)

## 2018-09-18 NOTE — Progress Notes (Signed)
Pt extubated without complications, placed on 5lpm [place], sats 97%,respiratory rate 16/min, no stridor noted. Will continue to monitor.

## 2018-09-18 NOTE — Progress Notes (Signed)
PT Cancellation Note  Patient Details Name: Matthew Zamora MRN: [id number] DOB: [birth year]   Cancelled Treatment:    Reason Eval/Treat Not Completed: Other (comment).  Consult received and chart reviewed; patient noted with pending COVID-19 testing.  Per current guidelines, patient being screened daily for critical rehab needs.  Will continue to follow daily and will formally initiate rehab services as medically appropriate and patient needs dictate (pt noted to be extubated this morning and will re-assess tomorrow for respiratory stability as well).  Leitha Bleak, PT 09/18/18, 1:53 PM [phone]

## 2018-09-19 LAB — BASIC METABOLIC PANEL
Anion gap: 8 (ref 5–15)
BUN: 22 mg/dL (ref 8–23)
CO2: 23 mmol/L (ref 22–32)
Calcium: 8.9 mg/dL (ref 8.9–10.3)
Chloride: 107 mmol/L (ref 98–111)
Creatinine, Ser: 1.16 mg/dL (ref 0.61–1.24)
GFR calc Af Amer: >60 mL/min (ref >60)
GFR calc non Af Amer: >60 mL/min (ref >60)
Glucose, Bld: 102 mg/dL — ABNORMAL HIGH (ref 70–99)
Potassium: 4.3 mmol/L (ref 3.5–5.1)
Sodium: 138 mmol/L (ref 135–145)

## 2018-09-19 LAB — GLUCOSE, CAPILLARY
Glucose-Capillary: 74 mg/dL (ref 70–99)
Glucose-Capillary: 84 mg/dL (ref 70–99)
Glucose-Capillary: 102 mg/dL — ABNORMAL HIGH (ref 70–99)
Glucose-Capillary: 90 mg/dL (ref 70–99)

## 2018-09-19 LAB — NOVEL CORONAVIRUS, NAA (HOSP ORDER, SEND-OUT TO REF LAB; TAT 18-24 HRS): SARS-CoV-2, NAA: NOT DETECTED

## 2018-09-19 MED ORDER — DILTIAZEM HCL 25 MG/5ML IV SOLN
10.0000 mg | Freq: Once | INTRAVENOUS | Status: AC
  Administered 2018-09-19: 10 mg via INTRAVENOUS
  Filled 2018-09-19: qty 5

## 2018-09-19 MED ORDER — ALPRAZOLAM 0.5 MG PO TABS
0.5000 mg | ORAL_TABLET | Freq: Three times a day (TID) | ORAL | Status: DC | PRN
  Administered 2018-09-19 – 2018-09-20 (×2): 0.5 mg via ORAL
  Filled 2018-09-19 (×2): qty 1

## 2018-09-19 MED ORDER — METOPROLOL TARTRATE 50 MG PO TABS
100.0000 mg | ORAL_TABLET | Freq: Two times a day (BID) | ORAL | Status: DC
  Administered 2018-09-19 – 2018-09-21 (×5): 100 mg via ORAL
  Filled 2018-09-19 (×5): qty 2

## 2018-09-19 MED ORDER — SODIUM CHLORIDE 0.9% FLUSH
10.0000 mL | Freq: Two times a day (BID) | INTRAVENOUS | Status: DC
  Administered 2018-09-19 – 2018-09-21 (×4): 10 mL via INTRAVENOUS

## 2018-09-19 MED ORDER — LISINOPRIL 5 MG PO TABS
5.0000 mg | ORAL_TABLET | Freq: Every day | ORAL | Status: DC
  Administered 2018-09-19 – 2018-09-21 (×3): 5 mg via ORAL
  Filled 2018-09-19 (×4): qty 1

## 2018-09-19 MED ORDER — LEVALBUTEROL HCL 1.25 MG/0.5ML IN NEBU
1.2500 mg | INHALATION_SOLUTION | Freq: Once | RESPIRATORY_TRACT | Status: AC
  Administered 2018-09-19: 1.25 mg via RESPIRATORY_TRACT
  Filled 2018-09-19: qty 0.5

## 2018-09-19 NOTE — Progress Notes (Signed)
CRITICAL CARE NOTE         SUBJECTIVE FINDINGS & SIGNIFICANT EVENTS  Patient significantly improved, status post physical therapy with success.  Not requiring Precedex now.  Will work on downgrading to medical floor today   PAST MEDICAL HISTORY   Past Medical History:  Diagnosis Date  . Abdominal aortic aneurysm (AAA) ([place]) 05/13/15   seen on ct scan  . 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 Anemia   . 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 CAD (coronary artery disease)   . Cataract cortical, senile   . CHF (congestive heart failure) ([place])   . Chronic hoarseness   . Exocrine pancreatic insufficiency   . H. pylori infection   . History of hepatitis   . Hyperlipidemia   . Hypertension   . 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 PAF (paroxysmal atrial fibrillation) ([place])   . Prostate CA [facility])      SURGICAL HISTORY   Past Surgical History:  Procedure Laterality Date  . CATARACT EXTRACTION    . COLONOSCOPY  10/06/2014, 09/18/2004, 03/14/2009  . ESOPHAGOGASTRODUODENOSCOPY  10/06/2014, 03/18/2001, 03/14/2009  . ESOPHAGOGASTRODUODENOSCOPY (EGD) WITH PROPOFOL N/A 05/07/2018   Procedure: ESOPHAGOGASTRODUODENOSCOPY (EGD) WITH PROPOFOL;  Surgeon: Toledo, [name], MD;  Location: [facility] ENDOSCOPY;  Service: Gastroenterology;  Laterality: N/A;  . FLEXIBLE SIGMOIDOSCOPY  08/26/1990  . INSERTION OF ICD    . PROSTATE SURGERY    . TONSILLECTOMY       FAMILY HISTORY   Family History  Problem Relation Age of Onset  . Heart attack Mother   . Heart attack Father      SOCIAL HISTORY   Social History   Tobacco Use  . Smoking status: Former Smoker    Packs/day: 1.00    Years: 38.00    Pack years: 38.00    Types: Cigarettes    Last attempt to quit: 06/18/1999    Years since quitting: 19.2  . Smokeless  tobacco: Never Used  Substance Use Topics  . Alcohol use: No    Alcohol/week: 0.0 standard drinks  . Drug use: Not on file     MEDICATIONS   Current Medication:  Current Facility-Administered Medications:  .  0.9 %  sodium chloride infusion, , Intravenous, PRN, Ottie Glazier, MD, Last Rate: 5 mL/hr at 09/19/18 0509 .  acetaminophen (TYLENOL) tablet 650 mg, 650 mg, Oral, Q6H PRN **OR** acetaminophen (TYLENOL) suppository 650 mg, 650 mg, Rectal, Q6H PRN, Mody, Sital, MD .  ALPRAZolam Duanne Moron) tablet 0.5 mg, 0.5 mg, Oral, TID PRN, Awilda Bill, NP, 0.5 mg at 09/19/18 0630 .  aspirin chewable tablet 81 mg, 81 mg, Oral, Q24H, Christianjames Soule, MD, 81 mg at 09/19/18 0741 .  atorvastatin (LIPITOR) tablet 40 mg, 40 mg, Oral, Q24H, Lanney Gins, Wilfred Dayrit, MD, 40 mg at 09/18/18 2148 .  ceFEPIme (MAXIPIME) 2 g in sodium chloride 0.9 % 100 mL IVPB, 2 g, Intravenous, Q12H, Rocky Morel, RPH, Stopped at 09/18/18 2349 .  chlorhexidine gluconate (MEDLINE KIT) (PERIDEX) 0.12 % solution 15 mL, 15 mL, Mouth Rinse, BID, Darel Hong D, NP, 15 mL at 09/19/18 0818 .  Chlorhexidine Gluconate Cloth 2 % PADS 6 each, 6 each, Topical, Q0600, Ottie Glazier, MD, 6 each at 09/18/18 1117 .  clopidogrel (PLAVIX) tablet 75 mg, 75 mg, Per Tube, Q24H, Lanney Gins, Lanijah Warzecha, MD, 75 mg at 09/19/18 0741 .  dexmedetomidine (PRECEDEX) 400 MCG/100ML (4 mcg/mL) infusion, 0.4-1.2 mcg/kg/hr, Intravenous, Titrated, Bettey Costa, MD, Stopped at 09/19/18 1700 .  doxycycline (VIBRAMYCIN) 100  mg in sodium chloride 0.9 % 250 mL IVPB, 100 mg, Intravenous, Q12H, Charlett Nose, [facility], Last Rate: 125 mL/hr at 09/19/18 0823, 100 mg at 09/19/18 0823 .  enoxaparin (LOVENOX) injection 40 mg, 40 mg, Subcutaneous, Q24H, Mody, Sital, MD, 40 mg at 09/18/18 2148 .  fentaNYL 2526mg in NS 2538m(1078mml) infusion-PREMIX, 0-400 mcg/hr, Intravenous, Continuous, AleOttie GlazierD, Stopped at 09/18/18 [id number] insulin aspart (novoLOG) injection 0-15  Units, 0-15 Units, Subcutaneous, Q4H, Abygail Galeno, MD .  mupirocin ointment (BACTROBAN) 2 % 1 application, 1 application, Nasal, BID, AleOttie GlazierD, 1 application at 04/54/00/8648 .  norepinephrine (LEVOPHED) 4mg36m 250mL24mmix infusion, 0-40 mcg/min, Intravenous, Continuous, Mody,Bettey Costa Stopped at 09/19/18 0349 .  ondansetron (ZOFRAN) tablet 4 mg, 4 mg, Oral, Q6H PRN **OR** ondansetron (ZOFRAN) injection 4 mg, 4 mg, Intravenous, Q6H PRN, Mody,Benjie Karvonenal, MD, 4 mg at 09/19/18 0831 .  pantoprazole sodium (PROTONIX) 40 mg/20 mL oral suspension 40 mg, 40 mg, Per Tube, Q24H, AleskLanney Ginsd, MD, 40 mg at 09/18/18 2147 .  polyethylene glycol (MIRALAX / GLYCOLAX) packet 17 g, 17 g, Oral, Daily PRN, Mody,Bettey Costa   ALLERGIES   Patient has no known allergies.    REVIEW OF SYSTEMS     10 system review of systems is negative except as per subjective findings  PHYSICAL EXAMINATION   Vitals:   09/19/18 0747 09/19/18 0800  BP: 140/82 (!) 178/97  Pulse: 90   Resp: 20 20  Temp: 98.4 F (36.9 C)   SpO2: 97%     GENERAL: No apparent distress HEAD: Normocephalic, atraumatic.  EYES: Pupils equal, round, reactive to light.  No scleral icterus.  MOUTH: Moist mucosal membrane. NECK: Supple. No thyromegaly. No nodules. No JVD.  PULMONARY: There to auscultation bilaterally CARDIOVASCULAR: S1 and S2. Regular rate and rhythm. No murmurs, rubs, or gallops.  GASTROINTESTINAL: Soft, nontender, non-distended. No masses. Positive bowel sounds. No hepatosplenomegaly.  MUSCULOSKELETAL: No swelling, clubbing, or edema.  NEUROLOGIC: Mild distress due to acute illness SKIN:intact,warm,dry   LABS AND IMAGING       LAB RESULTS: Recent Labs  Lab 09/17/18 0839 09/18/18 0519 09/19/18 0520  NA 133* 136 138  K 5.2* 4.6 4.3  CL 99 104 107  CO2 '22 24 23  ' BUN '21 21 22  ' CREATININE 1.93* 1.46* 1.16  GLUCOSE 243* 103* 102*   Recent Labs  Lab 09/17/18 0839 09/18/18 0519  HGB 13.0  10.5*  HCT 40.8 33.8*  WBC 13.0* 11.1*  PLT 292 168     IMAGING RESULTS: No results found.    ASSESSMENT AND PLAN    -Multidisciplinary rounds held today  Acute Hypoxic Respiratory Failure-resolved now on room air Likely due to acute decompensated systolic CHF with EF 45-5076-19%  -Decreased need for Precedex as agitation has improved, will work on physical therapy and optimize sedation for downgrade to medical floor      - speech & swallow eval today -       -MRSA nasal + - CXR with bilateral infitlrates - doxy IV and will transition to PO post swallow eva   CARDIAC FAILURE-CHF stable Systolic chf as above -oxygen as needed -Lasix as tolerated -follow up cardiac enzymes as indicated ICU monitoring   Acute kidney injury-resolved -follow chem 7 -follow UO -continue Foley Catheter-assess need daily     PossiblySepticshock -resolved -lactate elevated, hypothermic , elevated wbc count, requiring vasopressor support - cannot give aggressive fluid recussitation  due to acutely decompendated CHF -septic workup pending   ID-resolved -follow up cultures  GI/Nutrition GI PROPHYLAXIS as indicated DIET-->TF's as tolerated Constipation protocol as indicated  ENDO - ICU hypoglycemic\Hyperglycemia protocol -check FSBS per protocol   ELECTROLYTES -follow labs as needed -replace as needed -pharmacy consultation   DVT/GI PRX ordered -SCDs  TRANSFUSIONS AS NEEDED MONITOR FSBS ASSESS the need for LABS as needed   Critical care provider statement:  Critical care time (minutes):36 Critical care time was exclusive of: Separately billable procedures and treating other patients Critical care was necessary to treat or prevent imminent or life-threatening deterioration of the following conditions:acute decompensated systolic chf exacerbation, possible septic shock , acute hypoxic respi failure, multiple medical  comorbidities Critical care was time spent personally by me on the following activities: Development of treatment plan with patient or surrogate, discussions with consultants, evaluation of patient's response to treatment, examination of patient, obtaining history from patient or surrogate, ordering and performing treatments and interventions, ordering and review of laboratory studies and re-evaluation of patient's condition. I assumed direction of critical care for this patient from another provider in my specialty: no   This document was prepared using Dragon voice recognition software and may include unintentional dictation errors.      Ottie Glazier, M.D.  Division of [place]

## 2018-09-19 NOTE — Progress Notes (Signed)
VS and lab reviewed.  Patient in ICU on COVID19 precautions. Plan of care as per intensivist.

## 2018-09-19 NOTE — Progress Notes (Addendum)
  Speech Language Pathology Treatment: Dysphagia  Patient Details Name: Matthew Zamora MRN: [id number] DOB: 1941/05/26 Today's Date: 09/19/2018 Time: 0539-7673 SLP Time Calculation (min) (ACUTE ONLY): 34 min  Assessment / Plan / Recommendation Clinical Impression  This pleasant 78 y/o male overall status appears to be much improved since yesterday. Nsg reports pt has tested COVID-19 negative and has been sipping thin liquid via straw all night without overt s/s aspiration. SLP currently following for toleration of diet and ongoing assessment for possible diet upgrade. Pt reports he could not eat his breakfast stating his "sausage was all pureed up" and reporting he vomited a few times this morning and has not been able to keep food down. He reports he is able to sip liquids without problem. Pt endorses taking medicine for heartburn and reports toleration of regular diet without issue at home. Pt reports no prior s/s esophageal dysphagia; however, reports since hospital admission, he is having trouble keeping food down, SLP noted several instances of belching at the end of PO trials.  Pt observed to tolerate 4oz thin liquid by CUP with no overt s/s aspiration, no change in vital signs, and no apparent adverse effects. Noted possible positive s/s aspiration/laryngeal penetration c/b weak delayed throat clear following several single sips thin liquid via straw. Pt efficiently masticated 3 small bites graham cracker and swallowed solid without difficulty and without overt s/s aspiration. Recommend upgrade diet to Dysphagia III with thin liquid, NO STRAW, may give meds in thin liquid or puree per pt preference (pt states applesauce currently makes him naseusous). Rec small single sips by CUP, alternate sip thin liquid with small bite soft solid to assist esophageal clearance, and strict aspiration precautions. Discussed pt performance in tx and rec for diet upgrade with nsg. Nsg stated agreement and  understanding.   MD may consider possible GI assessment if nauseau does not clear and pt continues to vomit following consumption of soft solids. SLP to f/u with toleration of diet 1-3 days.   HPI HPI: H&P 09/17/2018: Matthew Zamora  is a 78 y.o. male with a known history of chronic systolic heart failure, CAD,PAF and CKD stage 3 who presented via EMS due to respiratory distress.  When EMS arrived his O2 was in the 70'2 and was transferred to the hospital on CPAP.  Patient was placed on covered precautions and was intubated by ED physician.  Chest x-ray is concerning for bilateral pneumonia.  He was given Rocephin and azithromycin  Patient is on Levophed and Precedex.  He is hypothermic as well.      SLP Plan  Continue with current plan of care       Recommendations  Diet recommendations: Dysphagia 3 (mechanical soft);Thin liquid Liquids provided via: Cup;No straw Medication Administration: Whole meds with liquid Supervision: Patient able to self feed Compensations: Minimize environmental distractions;Slow rate;Small sips/bites;Follow solids with liquid Postural Changes and/or Swallow Maneuvers: Seated upright 90 degrees;Upright 30-60 min after meal                Oral Care Recommendations: Oral care QID Follow up Recommendations: Other (comment)(TBD) SLP Visit Diagnosis: Dysphagia, unspecified (R13.10) Plan: Continue with current plan of care       GO                Wilferd Ritson, MA, CCC-SLP 09/19/2018, 10:44 AM

## 2018-09-19 NOTE — Evaluation (Signed)
Physical Therapy Evaluation Patient Details Name: Matthew Zamora MRN: [id number] DOB: Aug 19, 1940 Today's Date: 09/19/2018   History of Present Illness  Matthew Zamora  is a 78 y.o. male who presented to the hospital by EMS on 09/17/2018 due to respiratory distress. He was intubated 07/19/2018 - 07/20/2018 and admitted for acute on chronic hypoxic respiratory failure in the setting of pneumonia and acute on chronic systolic heart failure with rule out COVID 19, and sepsis. Relevant past medical history includes AAA, CAD, CHF, chronic hoarseness, hepatitis, HTN, liver cyst, prostate CA. Chest x-ray shows multifocal pneumonia, lowr lung predominant. He was tested and found negative for COVID19.     Clinical Impression  Prior to hospitalization, pt was mod I with ambulation using SPC for household and community distances, was I with transfers, ADLs, and IADLs. Upon physical therapy evaluation, pt requires min A for bed mobility and transfers, and was unable to ambulate more than 3 feet using RW with min A. Pt is mildly impulsive. Patient's O2 sat dropped to 85% on RA and HR climbed to 136 bpm near age predicted max HR during functional mobility, but returned to baseline with rest and cuing for pursed lip breathing. Patient has experienced a decline in functional independence and mobility and would benefit from short term rehab to return to PLOF and maximal independence.     Follow Up Recommendations SNF    Equipment Recommendations  None recommended by PT    Recommendations for Other Services OT consult;Speech consult     Precautions / Restrictions Precautions Precautions: Fall Precaution Comments: fall risk; pt reports left leg buckles and is numb Restrictions Weight Bearing Restrictions: No      Mobility  Bed Mobility Overal bed mobility: Needs Assistance Bed Mobility: Supine to Sit   Sidelying to sit: Min assist       General bed mobility comments: able to move to edge of bed  with assistance for managing lines and scooting forward.   Transfers Overall transfer level: Needs assistance Equipment used: Rolling walker (2 wheeled) Transfers: Sit to/from Stand Sit to Stand: Min assist         General transfer comment: min A to maintain balance due to backward lean, help control descent and move AD appropriately. Pt failed to use correct hand placment or stablize walker properly.  Ambulation/Gait Ambulation/Gait assistance: Min assist Gait Distance (Feet): 3 Feet Assistive device: Rolling walker (2 wheeled) Gait Pattern/deviations: Decreased step length - right;Decreased step length - left;Decreased stride length;Leaning posteriorly     General Gait Details: decreased weightbearing through left LE, stooped posture. Required assistnace with safe navigation of AD and to maintain balance due to backward lean.   Stairs            Wheelchair Mobility    Modified Rankin (Stroke Patients Only)       Balance Overall balance assessment: Needs assistance Sitting-balance support: No upper extremity supported Sitting balance-Leahy Scale: Good     Standing balance support: Bilateral upper extremity supported Standing balance-Leahy Scale: Poor Standing balance comment: continues to lean back, requires min A to maintain upright posture.                              Pertinent Vitals/Pain Pain Assessment: No/denies pain    Home Living Family/patient expects to be discharged to:: Private residence Living Arrangements: Alone Available Help at Discharge: Available PRN/intermittently;Family Type of Home: Mobile home Home Access: Ramped entrance Entrance Stairs-Rails:  Right;Left(cannot reach both)   Home Layout: One level Home Equipment: Cane - single point;Walker - 2 wheels;Grab bars - toilet;Shower seat      Prior Function Level of Independence: Independent with assistive device(s)         Comments: ambulates with SPC, I with ADLs,  IADLs. Drives, shops for groceries, and cooks. Reports left knee buckles after an injury at the SNF he was at previously     Hand Dominance   Dominant Hand: Right    Extremity/Trunk Assessment   Upper Extremity Assessment Upper Extremity Assessment: Generalized weakness    Lower Extremity Assessment Lower Extremity Assessment: Generalized weakness    Cervical / Trunk Assessment Cervical / Trunk Assessment: Other exceptions Cervical / Trunk Exceptions: mildly stooped posture in standing  Communication   Communication: No difficulties  Cognition Arousal/Alertness: Awake/alert Behavior During Therapy: Impulsive(unaware of fall risk caused by deficits) Overall Cognitive Status: No family/caregiver present to determine baseline cognitive functioning                                        General Comments      Exercises Other Exercises Other Exercises: reviewed proper body and AD positioning and safe transfer techniques and breathing techniques during mobility.    Assessment/Plan    PT Assessment Patient needs continued PT services  PT Problem List Decreased strength;Decreased activity tolerance;Decreased balance;Decreased mobility;Decreased knowledge of use of DME;Decreased safety awareness;Decreased knowledge of precautions;Cardiopulmonary status limiting activity;Impaired sensation;Pain       PT Treatment Interventions DME instruction;Gait training;Functional mobility training;Therapeutic activities;Therapeutic exercise;Balance training;Neuromuscular re-education;Patient/family education    PT Goals (Current goals can be found in the Care Plan section)  Acute Rehab PT Goals Patient Stated Goal: get stronger and return home PT Goal Formulation: With patient Time For Goal Achievement: 10/03/18 Potential to Achieve Goals: Good    Frequency Min 2X/week   Barriers to discharge Decreased caregiver support;Other (comment) decreased strength and balance for  basic functional mobilty    Co-evaluation               AM-PAC PT "6 Clicks" Mobility  Outcome Measure Help needed turning from your back to your side while in a flat bed without using bedrails?: A Little Help needed moving from lying on your back to sitting on the side of a flat bed without using bedrails?: A Little Help needed moving to and from a bed to a chair (including a wheelchair)?: A Little Help needed standing up from a chair using your arms (e.g., wheelchair or bedside chair)?: A Little Help needed to walk in hospital room?: A Little Help needed climbing 3-5 steps with a railing? : Total 6 Click Score: 16    End of Session Equipment Utilized During Treatment: Gait belt Activity Tolerance: Patient tolerated treatment well;Treatment limited secondary to medical complications (Comment)(HR up to 136 bpm (near age predicted max HR) and O2 sat dropping to 84% on RA, recovered to WNL with rest) Patient left: in chair;with call bell/phone within reach Nurse Communication: Mobility status;Precautions;Other (comment)(results of session, and location of pt) PT Visit Diagnosis: Unsteadiness on feet (R26.81);Muscle weakness (generalized) (M62.81);Difficulty in walking, not elsewhere classified (R26.2)    Time: 2458-0998 PT Time Calculation (min) (ACUTE ONLY): 33 min   Charges:   PT Evaluation $PT Eval Moderate Complexity: 1 Mod PT Treatments $Therapeutic Activity: 8-22 mins        Everlean Alstrom. Graylon Good,  PT, DPT 09/19/18, 1:37 PM

## 2018-09-20 ENCOUNTER — Other Ambulatory Visit: Payer: Self-pay

## 2018-09-20 LAB — GLUCOSE, CAPILLARY
Glucose-Capillary: 90 mg/dL (ref 70–99)
Glucose-Capillary: 86 mg/dL (ref 70–99)
Glucose-Capillary: 107 mg/dL — ABNORMAL HIGH (ref 70–99)
Glucose-Capillary: 86 mg/dL (ref 70–99)
Glucose-Capillary: 83 mg/dL (ref 70–99)

## 2018-09-20 MED ORDER — FUROSEMIDE 20 MG PO TABS
20.0000 mg | ORAL_TABLET | Freq: Every day | ORAL | Status: DC
  Administered 2018-09-20 – 2018-09-21 (×2): 20 mg via ORAL
  Filled 2018-09-20 (×2): qty 1

## 2018-09-20 MED ORDER — OXYBUTYNIN CHLORIDE ER 5 MG PO TB24
15.0000 mg | ORAL_TABLET | Freq: Every day | ORAL | Status: DC
  Administered 2018-09-20: 15 mg via ORAL
  Filled 2018-09-20 (×2): qty 3

## 2018-09-20 MED ORDER — DOXYCYCLINE HYCLATE 100 MG PO TABS
100.0000 mg | ORAL_TABLET | Freq: Two times a day (BID) | ORAL | Status: DC
  Administered 2018-09-20 – 2018-09-21 (×3): 100 mg via ORAL
  Filled 2018-09-20 (×3): qty 1

## 2018-09-20 MED ORDER — ADULT MULTIVITAMIN W/MINERALS CH
1.0000 | ORAL_TABLET | Freq: Every day | ORAL | Status: DC
  Administered 2018-09-20 – 2018-09-21 (×2): 1 via ORAL
  Filled 2018-09-20 (×2): qty 1

## 2018-09-20 MED ORDER — SENNA 8.6 MG PO TABS: 1.0000 | ORAL_TABLET | Freq: Every day | ORAL | Status: DC | PRN

## 2018-09-20 MED ORDER — MELATONIN 5 MG PO TABS
2.5000 mg | ORAL_TABLET | Freq: Every evening | ORAL | Status: DC | PRN
  Administered 2018-09-21: 2.5 mg via ORAL
  Filled 2018-09-20 (×2): qty 0.5

## 2018-09-20 MED ORDER — MAGNESIUM CHLORIDE 64 MG PO TBEC
1.0000 | DELAYED_RELEASE_TABLET | Freq: Every day | ORAL | Status: DC
  Administered 2018-09-20 – 2018-09-21 (×2): 64 mg via ORAL
  Filled 2018-09-20 (×3): qty 1

## 2018-09-20 MED ORDER — BISACODYL 5 MG PO TBEC: 5.0000 mg | DELAYED_RELEASE_TABLET | Freq: Every day | ORAL | Status: DC | PRN

## 2018-09-20 MED ORDER — PANTOPRAZOLE SODIUM 40 MG PO PACK
40.0000 mg | PACK | ORAL | Status: DC
  Administered 2018-09-20: 40 mg via ORAL
  Filled 2018-09-20 (×2): qty 20

## 2018-09-20 MED ORDER — PANCRELIPASE (LIP-PROT-AMYL) 12000-38000 UNITS PO CPEP
24000.0000 [IU] | ORAL_CAPSULE | Freq: Two times a day (BID) | ORAL | Status: DC
  Administered 2018-09-20 – 2018-09-21 (×2): 24000 [IU] via ORAL
  Filled 2018-09-20 (×3): qty 2

## 2018-09-20 NOTE — Progress Notes (Signed)
[facility] [place] NAME: Matthew Zamora    MR#:  [id number]  DATE OF BIRTH:  16-Jun-1941  SUBJECTIVE:  CHIEF COMPLAINT:   Chief Complaint  Patient presents with  . Respiratory Distress   The patient has no complaints. REVIEW OF SYSTEMS:  Review of Systems  Constitutional: Negative for chills, fever and malaise/fatigue.  HENT: Negative for sore throat.   Eyes: Negative for blurred vision and double vision.  Respiratory: Negative for cough, hemoptysis, shortness of breath, wheezing and stridor.   Cardiovascular: Negative for chest pain, palpitations, orthopnea and leg swelling.  Gastrointestinal: Negative for abdominal pain, blood in stool, diarrhea, melena, nausea and vomiting.  Genitourinary: Negative for dysuria, flank pain and hematuria.  Musculoskeletal: Negative for back pain and joint pain.  Skin: Negative for rash.  Neurological: Negative for dizziness, sensory change, focal weakness, seizures, loss of consciousness, weakness and headaches.  Endo/Heme/Allergies: Negative for polydipsia.  Psychiatric/Behavioral: Negative for depression. The patient is not nervous/anxious.     DRUG ALLERGIES:  No Known Allergies VITALS:  Blood pressure (!) 158/81, pulse 94, temperature 98.1 F (36.7 C), temperature source Oral, resp. rate 20, height 6\' 2"  (1.88 m), weight 80 kg, SpO2 94 %. PHYSICAL EXAMINATION:  Physical Exam Constitutional:      General: He is not in acute distress.    Appearance: Normal appearance.  HENT:     Head: Normocephalic.     Mouth/Throat:     Mouth: Mucous membranes are moist.  Eyes:     General: No scleral icterus.    Conjunctiva/sclera: Conjunctivae normal.     Pupils: Pupils are equal, round, and reactive to light.  Neck:     Musculoskeletal: Normal range of motion and neck supple.     Vascular: No JVD.     Trachea: No tracheal deviation.  Cardiovascular:     Rate and Rhythm: Normal rate and  regular rhythm.     Heart sounds: Normal heart sounds. No murmur. No gallop.   Pulmonary:     Effort: Pulmonary effort is normal. No respiratory distress.     Breath sounds: Normal breath sounds. No wheezing or rales.  Abdominal:     General: Bowel sounds are normal. There is no distension.     Palpations: Abdomen is soft.     Tenderness: There is no abdominal tenderness. There is no rebound.  Musculoskeletal: Normal range of motion.        General: No tenderness.     Right lower leg: No edema.     Left lower leg: No edema.  Skin:    Findings: No erythema or rash.  Neurological:     General: No focal deficit present.     Mental Status: He is alert and oriented to person, place, and time.     Cranial Nerves: No cranial nerve deficit.  Psychiatric:        Mood and Affect: Mood normal.    LABORATORY PANEL:  Male CBC Recent Labs  Lab 09/18/18 0519  WBC 11.1*  HGB 10.5*  HCT 33.8*  PLT 168   ------------------------------------------------------------------------------------------------------------------ Chemistries  Recent Labs  Lab 09/17/18 0839 09/18/18 0519 09/19/18 0520  NA 133* 136 138  K 5.2* 4.6 4.3  CL 99 104 107  CO2 22 24 23   GLUCOSE 243* 103* 102*  BUN 21 21 22   CREATININE 1.93* 1.46* 1.16  CALCIUM 9.2 8.3* 8.9  MG  --  2.0  --   AST 34  --   --  ALT 12  --   --   ALKPHOS 59  --   --   BILITOT 0.8  --   --    RADIOLOGY:  No results found. ASSESSMENT AND PLAN:   78 y/o male with chronic systolic heart failure ejection fraction 40 to 45%, PAF, chronic kidney disease stage III and CAD who presents via EMS due to respiratory distress  1.  Acute on chronic hypoxic respiratory failure with 2 L of oxygen at home: Patient intubated and sedated on vent. Acute hypoxic respiratory failure in the setting of pneumonia and acute on chronic systolic heart failure. He was extubated and put on oxygen by nasal cannula. Improved with treatment in ICU. COVID 19  is negative so far.  2.  Sepsis with leukocytosis, lactic acidosis, tachycardia and hypoxia/tachypnea due to pneumonia: He has been treated with cefepime and doxycycline.  Discontinue cefepime and change to p.o. doxycycline. Blood cultures are negative so far.  Positive MRSA screen.  3.  Acute on chronic systolic heart failure EF 40 to 45%: The patient was treated with Lasix 40 mg IV twice daily Lasix was discontinued due to hypotension. Blood pressure is elevated, resume Lasix p.o.  4.  Hyperkalemia: Improved. 5.  ARF on Chronic kidney disease stage III: Improved to baseline.  6.  PAF: Heart rate is controlled. Hypertension.  Resumed hypertension medication. Generalized weakness.  PT evaluation suggest skilled nursing facility.  But the patient wants to be discharged to home with home health and PT on discharge. All the records are reviewed and case discussed with Care Management/Social Worker. Management plans discussed with the patient, family and they are in agreement.  CODE STATUS: Full Code  TOTAL TIME TAKING CARE OF THIS PATIENT: 28 minutes.   More than 50% of the time was spent in counseling/coordination of care: YES  POSSIBLE D/C IN 1-2 DAYS, DEPENDING ON CLINICAL CONDITION.   Demetrios Loll M.D on 09/20/2018 at 2:03 PM  Between 7am to 6pm - Pager - [phone]  After 6pm go to [email or web address] - [facility]

## 2018-09-21 MED ORDER — DOXYCYCLINE HYCLATE 100 MG PO TABS: 100.0000 mg | ORAL_TABLET | Freq: Two times a day (BID) | ORAL | 0 refills | Status: AC

## 2018-09-21 NOTE — Discharge Summary (Signed)
[facility] [place] NAME: Matthew Zamora    MR#:  [id number]  DATE OF BIRTH:  1940-09-17  DATE OF ADMISSION:  09/17/2018   ADMITTING PHYSICIAN: Bettey Costa, MD  DATE OF DISCHARGE: 09/21/2018 PRIMARY CARE PHYSICIAN: Maryland Pink, MD   ADMISSION DIAGNOSIS:  Septic shock ([place]) [A41.9, R65.21] Community acquired pneumonia, unspecified laterality [J18.9] DISCHARGE DIAGNOSIS:  Active Problems:   Acute hypoxemic respiratory failure ([place])  SECONDARY DIAGNOSIS:   Past Medical History:  Diagnosis Date  . Abdominal aortic aneurysm (AAA) ([place]) 05/13/15   seen on ct scan  . 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 Anemia   . 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 CAD (coronary artery disease)   . Cataract cortical, senile   . CHF (congestive heart failure) ([place])   . Chronic hoarseness   . Exocrine pancreatic insufficiency   . H. pylori infection   . History of hepatitis   . Hyperlipidemia   . Hypertension   . 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 PAF (paroxysmal atrial fibrillation) ([name])   . Prostate CA [facility])    HOSPITAL COURSE:  78 y/o male withchronic systolic heart failure ejection fraction 40 to 45%, PAF, chronic kidney disease stage III and CAD who presents via EMS due to respiratory distress  1. Acute on chronichypoxic respiratory failurewith 2 L of oxygen at home: Patient intubated and sedated on vent. Acute hypoxic respiratory failure in the setting of pneumoniaandacute on chronic systolic heart failure. He was extubated and put on oxygen by nasal cannula. Improved with treatment in ICU. COVID 19 is negative so far.  2. Sepsis with leukocytosis, lactic acidosis, tachycardia and hypoxia/tachypnea due to pneumonia: He has been treated with cefepime and doxycycline.  Discontinued cefepime and changed to p.o. doxycycline. Blood cultures are negative so far.  Positive MRSA screen.  3.Acute on chronic  systolic heart failure EF 40 to 45%: The patient was treated with Lasix 40 mg IV twice daily Lasix was discontinued due to hypotension. Blood pressure is elevated, resumed Lasix p.o.  4. Hyperkalemia: Improved. 5. ARF on Chronic kidney disease stage III: Improved to baseline.  6. PAF: Heart rate is controlled. Hypertension.  Resumed hypertension medication. Generalized weakness.  PT evaluation: home health and PT. DISCHARGE CONDITIONS:  Stable, discharge to home with HHPT. CONSULTS OBTAINED:  Treatment Team:  Ottie Glazier, MD DRUG ALLERGIES:  No Known Allergies DISCHARGE MEDICATIONS:   Allergies as of 09/21/2018   No Known Allergies     Medication List    TAKE these medications   aspirin EC 81 MG tablet Take 81 mg by mouth daily.   atorvastatin 40 MG tablet Commonly known as:  LIPITOR Take 40 mg by mouth daily.   clopidogrel 75 MG tablet Commonly known as:  PLAVIX Take 75 mg by mouth daily.   doxycycline 100 MG tablet Commonly known as:  VIBRA-TABS Take 1 tablet (100 mg total) by mouth every 12 (twelve) hours for 3 days.   furosemide 40 MG tablet Commonly known as:  LASIX Take 20 mg by mouth daily.   lipase/protease/amylase 12000 units Cpep capsule Commonly known as:  CREON Take 24,000 Units by mouth 2 (two) times daily at 10 am and 4 pm.   lisinopril 5 MG tablet Commonly known as:  PRINIVIL,ZESTRIL Take 5 mg by mouth daily.   magnesium 84 MG (7MEQ) Tbcr SR tablet Commonly known as:  MAGTAB Take 84 mg by mouth daily.   Melatonin 1 MG Tabs  Take 1 tablet by mouth at bedtime as needed.   metoprolol tartrate 100 MG tablet Commonly known as:  LOPRESSOR Take 1 tablet (100 mg total) by mouth 2 (two) times daily.   multivitamin with minerals tablet Take 1 tablet by mouth daily.   omeprazole 20 MG capsule Commonly known as:  PRILOSEC Take 20 mg by mouth daily.   oxybutynin 15 MG 24 hr tablet Commonly known as:  DITROPAN XL Take 15 mg by mouth at  bedtime.        DISCHARGE INSTRUCTIONS:  See AVS.  If you experience worsening of your admission symptoms, develop shortness of breath, life threatening emergency, suicidal or homicidal thoughts you must seek medical attention immediately by calling 911 or calling your MD immediately  if symptoms less severe.  You Must read complete instructions/literature along with all the possible adverse reactions/side effects for all the Medicines you take and that have been prescribed to you. Take any new Medicines after you have completely understood and accpet all the possible adverse reactions/side effects.   Please note  You were cared for by a hospitalist during your hospital stay. If you have any questions about your discharge medications or the care you received while you were in the hospital after you are discharged, you can call the unit and asked to speak with the hospitalist on call if the hospitalist that took care of you is not available. Once you are discharged, your primary care physician will handle any further medical issues. Please note that NO REFILLS for any discharge medications will be authorized once you are discharged, as it is imperative that you return to your primary care physician (or establish a relationship with a primary care physician if you do not have one) for your aftercare needs so that they can reassess your need for medications and monitor your lab values.    On the day of Discharge:  VITAL SIGNS:  Blood pressure 101/71, pulse (!) 115, temperature 97.8 F (36.6 C), temperature source Oral, resp. rate 20, height 6\' 2"  (1.88 m), weight 80 kg, SpO2 99 %. PHYSICAL EXAMINATION:  GENERAL:  78 y.o.-year-old patient lying in the bed with no acute distress.  EYES: Pupils equal, round, reactive to light and accommodation. No scleral icterus. Extraocular muscles intact.  HEENT: Head atraumatic, normocephalic. Oropharynx and nasopharynx clear.  NECK:  Supple, no jugular  venous distention. No thyroid enlargement, no tenderness.  LUNGS: Normal breath sounds bilaterally, no wheezing, rales,rhonchi or crepitation. No use of accessory muscles of respiration.  CARDIOVASCULAR: S1, S2 normal. No murmurs, rubs, or gallops.  ABDOMEN: Soft, non-tender, non-distended. Bowel sounds present. No organomegaly or mass.  EXTREMITIES: No pedal edema, cyanosis, or clubbing.  NEUROLOGIC: Cranial nerves II through XII are intact. Muscle strength 4/5 in all extremities. Sensation intact. Gait not checked.  PSYCHIATRIC: The patient is alert and oriented x 3.  SKIN: No obvious rash, lesion, or ulcer.  DATA REVIEW:   CBC Recent Labs  Lab 09/18/18 0519  WBC 11.1*  HGB 10.5*  HCT 33.8*  PLT 168    Chemistries  Recent Labs  Lab 09/17/18 0839 09/18/18 0519 09/19/18 0520  NA 133* 136 138  K 5.2* 4.6 4.3  CL 99 104 107  CO2 22 24 23   GLUCOSE 243* 103* 102*  BUN 21 21 22   CREATININE 1.93* 1.46* 1.16  CALCIUM 9.2 8.3* 8.9  MG  --  2.0  --   AST 34  --   --   ALT 12  --   --  ALKPHOS 31  --   --   BILITOT 0.8  --   --      Microbiology Results  Results for orders placed or performed during the hospital encounter of 09/17/18  Culture, blood (Routine x 2)     Status: None (Preliminary result)   Collection Time: 09/17/18  8:39 AM  Result Value Ref Range Status   Specimen Description BLOOD BLOOD LEFT FOREARM  Final   Special Requests   Final    BOTTLES DRAWN AEROBIC AND ANAEROBIC Blood Culture adequate volume   Culture   Final    NO GROWTH 4 DAYS Performed at [facility], [address], [place], [place] [address]    Report Status PENDING  Incomplete  Culture, blood (Routine x 2)     Status: None (Preliminary result)   Collection Time: 09/17/18  8:44 AM  Result Value Ref Range Status   Specimen Description BLOOD BLOOD RIGHT FOREARM  Final   Special Requests   Final    BOTTLES DRAWN AEROBIC AND ANAEROBIC Blood Culture adequate volume   Culture    Final    NO GROWTH 4 DAYS Performed at [facility], [address], [place], [place] [address]    Report Status PENDING  Incomplete  Novel Coronavirus, NAA (hospital order; send-out to ref lab)     Status: None   Collection Time: 09/17/18  8:44 AM  Result Value Ref Range Status   SARS-CoV-2, NAA NOT DETECTED NOT DETECTED Final    Comment: Negative (Not Detected) results do not exclude infection caused by SARS CoV 2 and should not be used as the sole basis for treatment or other patient management decisions. Optimum specimen types and timing for peak viral levels during infections caused  by SARS CoV 2 have not been determined. Collection of multiple specimens (types and time points) from the same patient may be necessary to detect the virus. Improper specimen collection and handling, sequence variability underlying assay primers and or probes, or the presence of organisms in  quantities less than the limit of detection of the assay may lead to false negative results. Positive and negative predictive values of testing are highly dependent on prevalence. False negative results are more likely when prevalence of disease is high. (NOTE) The expected result is Negative (Not Detected). The SARS CoV 2 test is intended for the presumptive qualitative  detection of nucleic acid from SARS CoV 2 in upper and lower  respir atory specimens. Testing methodology is real time RT PCR. Test results must be correlated with clinical presentation and  evaluated in the context of other laboratory and epidemiologic data.  Test performance can be affected because the epidemiology and  clinical spectrum of infection caused by SARS CoV 2 is not fully  known. For example, the optimum types of specimens to collect and  when during the course of infection these specimens are most likely  to contain detectable viral RNA may not be known. This test has not been Food and Drug Administration (FDA) cleared or   approved and has been authorized by FDA under an Emergency Use  Authorization (EUA). The test is only authorized for the duration of  the declaration that circumstances exist justifying the authorization  of emergency use of in vitro diagnostic tests for detection and or  diagnosis of SARS CoV 2 under Section 564(b)(1) of the Act, 21 U.S.C.  section [phone] 3(b)(1), unless the authorization is terminated or   revoked sooner. [facility] is  certified under the  [place] (CLIA), 42 U.S.C.  section [phone], to perform high complexity tests. Performed at [facility] [id number], [place], [place] [address] Laboratory Director: Loleta Books, MD Performed at [facility], [place] [address] [place], [place] [address]    Coronavirus Source NASOPHARYNGEAL  Final    Comment: Performed at [facility], [address] [place], [place] [address]  MRSA PCR Screening     Status: Abnormal   Collection Time: 09/17/18 11:42 AM  Result Value Ref Range Status   MRSA by PCR POSITIVE (A) NEGATIVE Final    Comment:        The GeneXpert MRSA Assay (FDA approved for NASAL specimens only), is one component of a comprehensive MRSA colonization surveillance program. It is not intended to diagnose MRSA infection nor to guide or monitor treatment for MRSA infections. RESULT CALLED TO, READ BACK BY AND VERIFIED WITH:  [facility] [place] AT 3716 09/17/18 SDR Performed at [facility], [address] [place], [place] [address]     RADIOLOGY:  No results found.   Management plans discussed with the patient, his son and they are in agreement.  CODE STATUS: Full Code   TOTAL TIME TAKING CARE OF THIS PATIENT: 35 minutes.    Demetrios Loll M.D on 09/21/2018 at 11:59 AM  Between 7am to 6pm - Pager - [phone]  After 6pm go to [email or web address] - Proofreader  [facility] [place] [facility]  Office  [phone]  CC: Primary care physician; [place] Pink, MD   Note: This dictation was prepared with Dragon dictation along with smaller phrase technology. Any transcriptional errors that result from this process are unintentional.

## 2018-09-21 NOTE — Progress Notes (Addendum)
Physical Therapy Treatment Patient Details Name: Matthew Zamora MRN: [id number] DOB: 09-21-1940 Today's Date: 09/21/2018    History of Present Illness Matthew Zamora  is a 78 y.o. male who presented to the hospital by EMS on 09/17/2018 due to respiratory distress. He was intubated 07/19/2018 - 07/20/2018 and admitted for acute on chronic hypoxic respiratory failure in the setting of pneumonia and acute on chronic systolic heart failure with rule out COVID 19, and sepsis. Relevant past medical history includes AAA, CAD, CHF, chronic hoarseness, hepatitis, HTN, liver cyst, prostate CA. Chest x-ray shows multifocal pneumonia, lowr lung predominant. He was tested and found negative for COVID19.     PT Comments    Patient reclining in bed with HR 120 bpm and O2 sat 99% on 2L/min O2 at start of session. Reports no pain and that he is expecting to discharge home later today and is willing to work with physical therapy. Patient tolerated treatment well and is making good progress towards goals. He demo modified I with supine to sit bed mobility and required SBA for sit <> stand transfers to RW and ambulation 230 feet using RW. He was able to ambulate approximately 100 feet on room air with O2 sat 90% and HR 110 following walk, afib noted on telemetry during ambulation. Completed session with pt seated in chair, alarm set, 2L O2 applied and vitals WFL. Patient continues to have difficulty moving left leg and is at risk for it buckling during ambulation. Patient appears very aware of this, however, chair follow was provided during session. Pt had no instances of buckling during this session. Pt is no longer pushing backwards in weight bearing positions and demonstrated improved safety awareness. Based on his demonstrated improvement today, now recommending home with HHPT and 24 hour supervision for discharge planning.    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  Restrictions Weight Bearing Restrictions: No    Mobility  Bed Mobility Overal bed mobility: Modified Independent Bed Mobility: Supine to Sit              Transfers Overall transfer level: Needs assistance Equipment used: Rolling walker (2 wheeled) Transfers: Sit to/from Stand Sit to Stand: Supervision         General transfer comment: sit <> stand x 3 trials from edge of low bed with SBA. Pt demonstrates appropriate safety awareness.   Ambulation/Gait Ambulation/Gait assistance: Supervision   Assistive device: Rolling walker (2 wheeled) Gait Pattern/deviations: Decreased step length - left;Decreased stride length;Leaning posteriorly;Decreased step length - right;Step-to pattern;Step-through pattern     General Gait Details: able to ambulate 230 feet with SBA. Demo step to pattern with left leading that improved by end of ambulation with cuing to step through pattern, heels almost passing contralateral toes. completed last 100 feet on RA and O2 sat dropped to 90% following amb. HR 110 following amb.    Stairs             Wheelchair Mobility    Modified Rankin (Stroke Patients Only)       Balance Overall balance assessment: Needs assistance Sitting-balance support: No upper extremity supported Sitting balance-Leahy Scale:  Good     Standing balance support: Bilateral upper extremity supported Standing balance-Leahy Scale: Good Standing balance comment: able to maintain balance with BUE support of RW during standing activities.                             Cognition Arousal/Alertness: Awake/alert   Overall  Cognitive Status: Within Functional Limits for tasks assessed                                        Exercises Other Exercises Other Exercises: reviewed proper AD use, transfer techniques, breathing techniques. Cuing for improved gait pattern during ambulation.     General Comments        Pertinent Vitals/Pain Pain Assessment: No/denies pain    Home Living                      Prior Function            PT Goals (current goals can now be found in the care plan section) Acute Rehab PT Goals Patient Stated Goal: get stronger and return home PT Goal Formulation: With patient Time For Goal Achievement: 10/03/18 Potential to Achieve Goals: Good Progress towards PT goals: Progressing toward goals    Frequency    Min 2X/week      PT Plan Discharge plan needs to be updated    Co-evaluation              AM-PAC PT "6 Clicks" Mobility   Outcome Measure  Help needed turning from your back to your side while in a flat bed without using bedrails?: None Help needed moving from lying on your back to sitting on the side of a flat bed without using bedrails?: None Help needed moving to and from a bed to a chair (including a wheelchair)?: A Little Help needed standing up from a chair using your arms (e.g., wheelchair or bedside chair)?: A Little Help needed to walk in hospital room?: A Little Help needed climbing 3-5 steps with a railing? : Total 6 Click Score: 18    End of Session Equipment Utilized During Treatment: Gait belt Activity Tolerance: Patient tolerated treatment well Patient left: in chair;with call bell/phone within reach;with chair alarm set Nurse Communication: Mobility status;Other (comment)(results of session, and location of pt) PT Visit Diagnosis: Unsteadiness on feet (R26.81);Muscle weakness (generalized) (M62.81);Difficulty in walking, not elsewhere classified (R26.2)     Time: 4562-5638 PT Time Calculation (min) (ACUTE  ONLY): 30 min  Charges:  $Gait Training: 8-22 mins $Therapeutic Activity: 8-22 mins                     Everlean Alstrom. Graylon Good, PT, DPT 09/21/18, 11:55 AM

## 2018-09-21 NOTE — Plan of Care (Signed)
  Problem: Education: Goal: Knowledge of General Education information will improve Description Including pain rating scale, medication(s)/side effects and non-pharmacologic comfort measures Outcome: Progressing   Problem: Health Behavior/Discharge Planning: Goal: Ability to manage health-related needs will improve Outcome: Progressing   Problem: Clinical Measurements: Goal: Ability to maintain clinical measurements within normal limits will improve Outcome: Progressing Goal: Will remain free from infection Outcome: Progressing Goal: Diagnostic test results will improve Outcome: Progressing Goal: Respiratory complications will improve Outcome: Progressing Goal: Cardiovascular complication will be avoided Outcome: Progressing   Problem: Activity: Goal: Risk for activity intolerance will decrease Outcome: Progressing   Problem: Nutrition: Goal: Adequate nutrition will be maintained Outcome: Progressing   Problem: Coping: Goal: Level of anxiety will decrease Outcome: Progressing   Problem: Pain Managment: Goal: General experience of comfort will improve Outcome: Progressing   Problem: Safety: Goal: Ability to remain free from injury will improve Outcome: Progressing   Problem: Respiratory: Goal: Ability to maintain adequate ventilation will improve Outcome: Progressing Goal: Ability to maintain a clear airway will improve Outcome: Progressing

## 2018-09-21 NOTE — Care Management Important Message (Signed)
Important Message  Patient Details  Name: Matthew Zamora MRN: [id number] Date of Birth: 11-Jul-1940   Medicare Important Message Given:  No  Attempted this morning, however PT in room working with patient.  Patient discharged prior to arrival to floor for second attempt to deliver Medicare IM.     Dannette Barbara 09/21/2018, 1:57 PM

## 2018-09-21 NOTE — TOC Transition Note (Signed)
Transition of Care [facility]) - CM/SW Discharge Note   Patient Details  Name: Matthew Zamora MRN: [id number] Date of Birth: 28-Jun-1940  Transition of Care [facility] CM/SW Contact:  [name], RN Phone Number: 09/21/2018, 11:26 AM   Clinical Narrative:   Spoke with patient this morning on the phone.  He is discharging to home with his son, [name].  [name] will be staying with him for awhile.  PT recommended SNF; patient refuses and would like to discharge home.  Offered home health services.  Referral made to [facility] for RN, PT and aide.  Patient uses a walker at home and uses oxygen prn.  Current with PCP.  Medications obtained at [facility] on [address].  Son [name] will transport home.  No further needs identified by care management.      Final next level of care: Home w Home Health Services Barriers to Discharge: No Barriers Identified   Patient Goals and CMS Choice Patient states their goals for this hospitalization and ongoing recovery are:: discharge to home with son CMS Medicare.gov Compare Post Acute Care list provided to:: Patient Choice offered to / list presented to : Patient  Discharge Placement                       Discharge Plan and Services   Discharge Planning Services: CM Consult Post Acute Care Choice: Home Health              HH Arranged: RN, PT, Nurse's Aide [place] Agency: [place]   Social Determinants of Health (SDOH) Interventions     Readmission Risk Interventions No flowsheet data found.

## 2018-09-21 NOTE — Discharge Instructions (Signed)
HHPT Fall and aspiration precaution. Dysphagia 3 diet.

## 2018-09-22 LAB — CULTURE, BLOOD (ROUTINE X 2)
Culture: NO GROWTH
Culture: NO GROWTH
Special Requests: ADEQUATE
Special Requests: ADEQUATE

## 2018-09-26 ENCOUNTER — Emergency Department
Admission: EM | Admit: 2018-09-26 | Discharge: 2018-09-26 | Discharge status: Absent without leave | Payer: Medicare Other

## 2018-10-04 LAB — BLOOD GAS, ARTERIAL
Acid-base deficit: 6.2 mmol/L — ABNORMAL HIGH (ref 0.0–2.0)
Bicarbonate: 21.8 mmol/L (ref 20.0–28.0)
FIO2: 1
MECHVT: 500 mL
O2 Saturation: 98.6 %
PEEP: 5 cmH2O
Patient temperature: 37
pCO2 arterial: 52 mmHg — ABNORMAL HIGH (ref 32.0–48.0)
pH, Arterial: 7.23 — ABNORMAL LOW (ref 7.350–7.450)
pO2, Arterial: 137 mmHg — ABNORMAL HIGH (ref 83.0–108.0)

## 2018-12-31 ENCOUNTER — Ambulatory Visit (INDEPENDENT_AMBULATORY_CARE_PROVIDER_SITE_OTHER): Payer: Medicare Other | Admitting: Nurse Practitioner

## 2018-12-31 ENCOUNTER — Encounter (INDEPENDENT_AMBULATORY_CARE_PROVIDER_SITE_OTHER): Payer: Self-pay | Admitting: Nurse Practitioner

## 2018-12-31 ENCOUNTER — Ambulatory Visit (INDEPENDENT_AMBULATORY_CARE_PROVIDER_SITE_OTHER): Payer: Medicare Other

## 2018-12-31 ENCOUNTER — Other Ambulatory Visit: Payer: Self-pay

## 2018-12-31 VITALS — BP 108/68 | HR 73 | Resp 16 | Wt 160.0 lb

## 2018-12-31 DIAGNOSIS — I10 Essential (primary) hypertension: Secondary | ICD-10-CM

## 2018-12-31 DIAGNOSIS — E785 Hyperlipidemia, unspecified: Secondary | ICD-10-CM

## 2018-12-31 DIAGNOSIS — Z87891 Personal history of nicotine dependence: Secondary | ICD-10-CM | POA: Diagnosis not present

## 2018-12-31 DIAGNOSIS — I714 Abdominal aortic aneurysm, without rupture, unspecified: Secondary | ICD-10-CM

## 2018-12-31 DIAGNOSIS — Z79899 Other long term (current) drug therapy: Secondary | ICD-10-CM

## 2019-01-07 ENCOUNTER — Encounter (INDEPENDENT_AMBULATORY_CARE_PROVIDER_SITE_OTHER): Payer: Self-pay | Admitting: Nurse Practitioner

## 2019-01-07 NOTE — Progress Notes (Signed)
SUBJECTIVE:  Patient ID: Matthew Zamora, male    DOB: [birth year], 78 y.o.   MRN: [id number] Chief Complaint  Patient presents with  . Follow-up    ultrasound follow up    HPI  Matthew Zamora is a 78 y.o. male The patient returns to the office for surveillance of a known abdominal aortic aneurysm. Patient denies abdominal pain or back pain, no other abdominal complaints. No changes suggesting embolic episodes.   There have been no interval changes in the patient's overall health care since his last visit.  Patient denies amaurosis fugax or TIA symptoms. There is no history of claudication or rest pain symptoms of the lower extremities. The patient denies angina or shortness of breath.   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 Essentially unchanged from 07/01/2018.  Past Medical History:  Diagnosis Date  . Abdominal aortic aneurysm (AAA) ([place]) 05/13/15   seen on ct scan  . 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 Anemia   . 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 CAD (coronary artery disease)   . Cataract cortical, senile   . CHF (congestive heart failure) ([place])   . Chronic hoarseness   . Exocrine pancreatic insufficiency   . H. pylori infection   . History of hepatitis   . Hyperlipidemia   . Hypertension   . 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 PAF (paroxysmal atrial fibrillation) ([place])   . Prostate CA [facility])     Past Surgical History:  Procedure Laterality Date  . CATARACT EXTRACTION    . COLONOSCOPY  10/06/2014, 09/18/2004, 03/14/2009  . ESOPHAGOGASTRODUODENOSCOPY  10/06/2014, 03/18/2001, 03/14/2009  . ESOPHAGOGASTRODUODENOSCOPY (EGD) WITH PROPOFOL N/A 05/07/2018   Procedure: ESOPHAGOGASTRODUODENOSCOPY (EGD) WITH PROPOFOL;  Surgeon: Toledo, [name], MD;  Location: [facility] ENDOSCOPY;  Service: Gastroenterology;  Laterality: N/A;  . FLEXIBLE SIGMOIDOSCOPY  08/26/1990  . INSERTION OF ICD     . PROSTATE SURGERY    . TONSILLECTOMY      Social History   Socioeconomic History  . Marital status: Single    Spouse name: Not on file  . Number of children: Not on file  . Years of education: Not on file  . Highest education level: Not on file  Occupational History  . Not on file  Social Needs  . Financial resource strain: Not on file  . Food insecurity    Worry: Not on file    Inability: Not on file  . Transportation needs    Medical: Not on file    Non-medical: Not on file  Tobacco Use  . Smoking status: Former Smoker    Packs/day: 1.00    Years: 38.00    Pack years: 38.00    Types: Cigarettes    Quit date: 06/18/1999    Years since quitting: 19.5  . Smokeless tobacco: Never Used  Substance and Sexual Activity  . Alcohol use: No    Alcohol/week: 0.0 standard drinks  . Drug use: Not on file  . Sexual activity: Not on file  Lifestyle  . Physical activity    Days per week: Not on file    Minutes per session: Not on file  . Stress: Not on file  Relationships  . Social Herbalist on phone: Not on file    Gets together: Not on file    Attends religious service: Not on file    Active member of club or  organization: Not on file    Attends meetings of clubs or organizations: Not on file    Relationship status: Not on file  . Intimate partner violence    Fear of current or ex partner: Not on file    Emotionally abused: Not on file    Physically abused: Not on file    Forced sexual activity: Not on file  Other Topics Concern  . Not on file  Social History Narrative  . Not on file    Family History  Problem Relation Age of Onset  . Heart attack Mother   . Heart attack Father     No Known Allergies   Review of Systems   Review of Systems: Negative Unless Checked Constitutional: [] Weight loss  [] Fever  [] Chills Cardiac: [] Chest pain   []  Atrial Fibrillation  [] Palpitations   [] Shortness of breath when laying flat   [] Shortness of breath with  exertion. [] Shortness of breath at rest Vascular:  [] Pain in legs with walking   [] Pain in legs with standing [] Pain in legs when laying flat   [] Claudication    [] Pain in feet when laying flat    [] History of DVT   [] Phlebitis   [] Swelling in legs   [] Varicose veins   [] Non-healing ulcers Pulmonary:   [] Uses home oxygen   [] Productive cough   [] Hemoptysis   [] Wheeze  [] COPD   [] Asthma Neurologic:  [] Dizziness   [] Seizures  [] Blackouts [] History of stroke   [] History of TIA  [] Aphasia   [] Temporary Blindness   [] Weakness or numbness in arm   [] Weakness or numbness in leg Musculoskeletal:   [] Joint swelling   [] Joint pain   [] Low back pain  []  History of Knee Replacement [] Arthritis [] back Surgeries  []  Spinal Stenosis    Hematologic:  [] Easy bruising  [] Easy bleeding   [] Hypercoagulable state   [x] Anemic Gastrointestinal:  [] Diarrhea   [] Vomiting  [x] Gastroesophageal reflux/heartburn   [] Difficulty swallowing. [] Abdominal pain Genitourinary:  [] Chronic kidney disease   [] Difficult urination  [] Anuric   [] Blood in urine [] Frequent urination  [] Burning with urination   [] Hematuria Skin:  [] Rashes   [] Ulcers [] Wounds Psychological:  [] History of anxiety   []  History of major depression  []  Memory Difficulties      OBJECTIVE:   Physical Exam  BP 108/68 (BP Location: Right Arm)   Pulse 73   Resp 16   Wt 160 lb (72.6 kg)   BMI 20.54 kg/m   Gen: WD/WN, NAD Head: [place]/AT, No temporalis wasting.  Ear/Nose/Throat: Hearing grossly intact, nares w/o erythema or drainage Eyes: PER, EOMI, sclera nonicteric.  Neck: Supple, no masses.  No JVD.  Pulmonary:  Good air movement, no use of accessory muscles.  Cardiac: RRR Vascular:  Vessel Right Left  Radial Palpable Palpable  Popliteal Not Palpable Not Palpable  Dorsalis Pedis Palpable Palpable  Posterior Tibial Palpable Palpable   Gastrointestinal: soft, non-distended. No guarding/no peritoneal signs.  Musculoskeletal: M/S 5/5 throughout.  No  deformity or atrophy.  Neurologic: Pain and light touch intact in extremities.  Symmetrical.  Speech is fluent. Motor exam as listed above. Psychiatric: Judgment intact, Mood & affect appropriate for pt's clinical situation. Dermatologic: No Venous rashes. No Ulcers Noted.  No changes consistent with cellulitis. Lymph : No Cervical lymphadenopathy, no lichenification or skin changes of chronic lymphedema.       ASSESSMENT AND PLAN:  1. AAA (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 ([name]) No surgery or intervention at this time. The patient has an asymptomatic abdominal aortic  aneurysm that is less than 4 cm in maximal diameter.  I have discussed the natural history of abdominal aortic aneurysm and the small risk of rupture for aneurysm less than 5 cm in size.  However, as these small aneurysms tend to enlarge over time, continued surveillance with ultrasound or CT scan is mandatory.  I have also discussed optimizing medical management with hypertension and lipid control and the importance of abstinence from tobacco.  The patient is also encouraged to exercise a minimum of 30 minutes 4 times a week.  Should the patient develop new onset abdominal or back pain or signs of peripheral embolization they are instructed to seek medical attention immediately and to alert the physician providing care that they have an aneurysm.  The patient voices their understanding. The patient will return in 12 months with an aortic duplex.  - VAS [place] AAA DUPLEX; Future  2. Hyperlipidemia, unspecified hyperlipidemia type Continue statin as ordered and reviewed, no changes at this time   3. Essential hypertension Continue antihypertensive medications as already ordered, these medications have been reviewed and there are no changes at this time.   Current Outpatient Medications on File Prior to Visit  Medication Sig Dispense Refill  . albuterol (VENTOLIN HFA) 108 (90 Base) MCG/ACT inhaler Inhale into the lungs  as needed.     [name] atorvastatin (LIPITOR) 40 MG tablet Take 40 mg by mouth daily.    . clopidogrel (PLAVIX) 75 MG tablet Take 75 mg by mouth daily.  5  . ELIQUIS 5 MG TABS tablet Take 5 mg by mouth every 12 (twelve) hours.    . furosemide (LASIX) 40 MG tablet Take 20 mg by mouth daily.    [name] gabapentin (NEURONTIN) 300 MG capsule TAKE 1 CAPSULE BY MOUTH NIGHTLY. MAY INCREASE TO 2 CAPSULES AT NIGHT AFTER ONE WEEK IF NO IMPROVEMENT.    [name] lisinopril (PRINIVIL,ZESTRIL) 5 MG tablet Take 5 mg by mouth daily.    . magnesium (MAGTAB) 84 MG (7MEQ) TBCR SR tablet Take 84 mg by mouth daily.    . Melatonin 1 MG TABS Take 1 tablet by mouth at bedtime as needed.    . metoprolol tartrate (LOPRESSOR) 100 MG tablet Take 1 tablet (100 mg total) by mouth 2 (two) times daily.    . Multiple Vitamins-Minerals (MULTIVITAMIN WITH MINERALS) tablet Take 1 tablet by mouth daily.    [name] omeprazole (PRILOSEC) 20 MG capsule Take 20 mg by mouth daily.    [name] oxybutynin (DITROPAN XL) 15 MG 24 hr tablet Take 15 mg by mouth at bedtime.    . tadalafil (CIALIS) 20 MG tablet TAKE 1 TABLET BY MOUTH ONCE DAILY AS NEEDED FOR ERECTILE DYSFUNCTION    . aspirin EC 81 MG tablet Take 81 mg by mouth daily.    . lipase/protease/amylase (CREON) 12000 units CPEP capsule Take 24,000 Units by mouth 2 (two) times daily at 10 am and 4 pm.     No current facility-administered medications on file prior to visit.     There are no Patient Instructions on file for this visit. No follow-ups on file.   Kris Hartmann, NP  This note was completed with Sales executive.  Any errors are purely unintentional.

## 2019-09-17 ENCOUNTER — Other Ambulatory Visit: Payer: Self-pay

## 2019-09-17 ENCOUNTER — Encounter: Payer: Self-pay | Admitting: *Deleted

## 2019-09-17 ENCOUNTER — Inpatient Hospital Stay
Admit: 2019-09-17 | Discharge: 2019-09-17 | Disposition: A | Discharge status: Home / Self Care | Payer: Medicare Other | Attending: Internal Medicine | Admitting: Internal Medicine

## 2019-09-17 ENCOUNTER — Emergency Department: Payer: Medicare Other

## 2019-09-17 ENCOUNTER — Observation Stay
Admission: EM | Admit: 2019-09-17 | Discharge: 2019-09-18 | Disposition: A | Discharge status: Home / Self Care | Payer: Medicare Other | Attending: Internal Medicine | Admitting: Internal Medicine

## 2019-09-17 DIAGNOSIS — I480 Paroxysmal atrial fibrillation: Secondary | ICD-10-CM | POA: Diagnosis not present

## 2019-09-17 DIAGNOSIS — Z79899 Other long term (current) drug therapy: Secondary | ICD-10-CM | POA: Diagnosis not present

## 2019-09-17 DIAGNOSIS — Z955 Presence of coronary angioplasty implant and graft: Secondary | ICD-10-CM | POA: Diagnosis not present

## 2019-09-17 DIAGNOSIS — I5023 Acute on chronic systolic (congestive) heart failure: Secondary | ICD-10-CM

## 2019-09-17 DIAGNOSIS — E785 Hyperlipidemia, unspecified: Secondary | ICD-10-CM | POA: Diagnosis not present

## 2019-09-17 DIAGNOSIS — Z23 Encounter for immunization: Secondary | ICD-10-CM | POA: Insufficient documentation

## 2019-09-17 DIAGNOSIS — Z7901 Long term (current) use of anticoagulants: Secondary | ICD-10-CM | POA: Diagnosis not present

## 2019-09-17 DIAGNOSIS — I130 Hypertensive heart and chronic kidney disease with heart failure and stage 1 through stage 4 chronic kidney disease, or unspecified chronic kidney disease: Secondary | ICD-10-CM | POA: Diagnosis not present

## 2019-09-17 DIAGNOSIS — N183 Chronic kidney disease, stage 3 unspecified: Secondary | ICD-10-CM | POA: Diagnosis present

## 2019-09-17 DIAGNOSIS — I255 Ischemic cardiomyopathy: Secondary | ICD-10-CM | POA: Diagnosis present

## 2019-09-17 DIAGNOSIS — Z20822 Contact with and (suspected) exposure to covid-19: Secondary | ICD-10-CM | POA: Diagnosis not present

## 2019-09-17 DIAGNOSIS — I5043 Acute on chronic combined systolic (congestive) and diastolic (congestive) heart failure: Secondary | ICD-10-CM | POA: Diagnosis present

## 2019-09-17 DIAGNOSIS — Z87891 Personal history of nicotine dependence: Secondary | ICD-10-CM | POA: Insufficient documentation

## 2019-09-17 DIAGNOSIS — I2510 Atherosclerotic heart disease of native coronary artery without angina pectoris: Secondary | ICD-10-CM | POA: Diagnosis not present

## 2019-09-17 DIAGNOSIS — I714 Abdominal aortic aneurysm, without rupture: Secondary | ICD-10-CM | POA: Diagnosis not present

## 2019-09-17 DIAGNOSIS — Z9981 Dependence on supplemental oxygen: Secondary | ICD-10-CM | POA: Diagnosis not present

## 2019-09-17 DIAGNOSIS — Z7982 Long term (current) use of aspirin: Secondary | ICD-10-CM | POA: Insufficient documentation

## 2019-09-17 DIAGNOSIS — I083 Combined rheumatic disorders of mitral, aortic and tricuspid valves: Secondary | ICD-10-CM | POA: Diagnosis not present

## 2019-09-17 DIAGNOSIS — N1831 Chronic kidney disease, stage 3a: Secondary | ICD-10-CM | POA: Insufficient documentation

## 2019-09-17 DIAGNOSIS — R778 Other specified abnormalities of plasma proteins: Secondary | ICD-10-CM | POA: Insufficient documentation

## 2019-09-17 DIAGNOSIS — I509 Heart failure, unspecified: Secondary | ICD-10-CM

## 2019-09-17 DIAGNOSIS — Z8546 Personal history of malignant neoplasm of prostate: Secondary | ICD-10-CM | POA: Insufficient documentation

## 2019-09-17 DIAGNOSIS — J9621 Acute and chronic respiratory failure with hypoxia: Secondary | ICD-10-CM | POA: Diagnosis not present

## 2019-09-17 DIAGNOSIS — Z95810 Presence of automatic (implantable) cardiac defibrillator: Secondary | ICD-10-CM | POA: Insufficient documentation

## 2019-09-17 LAB — COMPREHENSIVE METABOLIC PANEL
ALT: 16 U/L (ref 0–44)
AST: 22 U/L (ref 15–41)
Albumin: 4.4 g/dL (ref 3.5–5.0)
Alkaline Phosphatase: 60 U/L (ref 38–126)
Anion gap: 9 (ref 5–15)
BUN: 30 mg/dL — ABNORMAL HIGH (ref 8–23)
CO2: 29 mmol/L (ref 22–32)
Calcium: 9.3 mg/dL (ref 8.9–10.3)
Chloride: 101 mmol/L (ref 98–111)
Creatinine, Ser: 1.86 mg/dL — ABNORMAL HIGH (ref 0.61–1.24)
GFR calc Af Amer: 40 mL/min — ABNORMAL LOW (ref >60)
GFR calc non Af Amer: 34 mL/min — ABNORMAL LOW (ref >60)
Glucose, Bld: 132 mg/dL — ABNORMAL HIGH (ref 70–99)
Potassium: 4.2 mmol/L (ref 3.5–5.1)
Sodium: 139 mmol/L (ref 135–145)
Total Bilirubin: 0.7 mg/dL (ref 0.3–1.2)
Total Protein: 7.2 g/dL (ref 6.5–8.1)

## 2019-09-17 LAB — CBC WITH DIFFERENTIAL/PLATELET
Abs Immature Granulocytes: 0.02 10*3/uL (ref 0.00–0.07)
Basophils Absolute: 0 10*3/uL (ref 0.0–0.1)
Basophils Relative: 0 %
Eosinophils Absolute: 0.1 10*3/uL (ref 0.0–0.5)
Eosinophils Relative: 1 %
HCT: 35.3 % — ABNORMAL LOW (ref 39.0–52.0)
Hemoglobin: 11.5 g/dL — ABNORMAL LOW (ref 13.0–17.0)
Immature Granulocytes: 0 %
Lymphocytes Relative: 9 %
Lymphs Abs: 0.9 10*3/uL (ref 0.7–4.0)
MCH: 30.8 pg (ref 26.0–34.0)
MCHC: 32.6 g/dL (ref 30.0–36.0)
MCV: 94.6 fL (ref 80.0–100.0)
Monocytes Absolute: 0.6 10*3/uL (ref 0.1–1.0)
Monocytes Relative: 6 %
Neutro Abs: 7.9 10*3/uL — ABNORMAL HIGH (ref 1.7–7.7)
Neutrophils Relative %: 84 %
Platelets: 175 10*3/uL (ref 150–400)
RBC: 3.73 MIL/uL — ABNORMAL LOW (ref 4.22–5.81)
RDW: 12.3 % (ref 11.5–15.5)
WBC: 9.5 10*3/uL (ref 4.0–10.5)
nRBC: 0 % (ref 0.0–0.2)

## 2019-09-17 LAB — TROPONIN I (HIGH SENSITIVITY)
Troponin I (High Sensitivity): 23 ng/L — ABNORMAL HIGH (ref <18)
Troponin I (High Sensitivity): 42 ng/L — ABNORMAL HIGH (ref <18)

## 2019-09-17 LAB — POC SARS CORONAVIRUS 2 AG: SARS Coronavirus 2 Ag: NEGATIVE

## 2019-09-17 LAB — BRAIN NATRIURETIC PEPTIDE: B Natriuretic Peptide: 518 pg/mL — ABNORMAL HIGH (ref 0.0–100.0)

## 2019-09-17 LAB — RESPIRATORY PANEL BY RT PCR (FLU A&B, COVID)
Influenza A by PCR: NEGATIVE
Influenza B by PCR: NEGATIVE
SARS Coronavirus 2 by RT PCR: NEGATIVE

## 2019-09-17 MED ORDER — ONDANSETRON HCL 4 MG PO TABS: 4.0000 mg | ORAL_TABLET | Freq: Four times a day (QID) | ORAL | Status: DC | PRN

## 2019-09-17 MED ORDER — SODIUM CHLORIDE 0.9 % IV SOLN: 250.0000 mL | INTRAVENOUS | Status: DC | PRN

## 2019-09-17 MED ORDER — SENNOSIDES-DOCUSATE SODIUM 8.6-50 MG PO TABS: 1.0000 | ORAL_TABLET | Freq: Every evening | ORAL | Status: DC | PRN

## 2019-09-17 MED ORDER — ACETAMINOPHEN 325 MG PO TABS: 650.0000 mg | ORAL_TABLET | Freq: Four times a day (QID) | ORAL | Status: DC | PRN

## 2019-09-17 MED ORDER — APIXABAN 5 MG PO TABS
5.0000 mg | ORAL_TABLET | Freq: Two times a day (BID) | ORAL | Status: DC
  Administered 2019-09-17 – 2019-09-18 (×3): 5 mg via ORAL
  Filled 2019-09-17 (×3): qty 1

## 2019-09-17 MED ORDER — OXYCODONE HCL 5 MG PO TABS: 5.0000 mg | ORAL_TABLET | ORAL | Status: DC | PRN

## 2019-09-17 MED ORDER — SODIUM CHLORIDE 0.9% FLUSH
3.0000 mL | Freq: Two times a day (BID) | INTRAVENOUS | Status: DC
  Administered 2019-09-17 – 2019-09-18 (×3): 3 mL via INTRAVENOUS

## 2019-09-17 MED ORDER — MORPHINE SULFATE (PF) 2 MG/ML IV SOLN: 2.0000 mg | INTRAVENOUS | Status: DC | PRN

## 2019-09-17 MED ORDER — SODIUM CHLORIDE 0.9% FLUSH: 3.0000 mL | INTRAVENOUS | Status: DC | PRN

## 2019-09-17 MED ORDER — ONDANSETRON HCL 4 MG/2ML IJ SOLN: 4.0000 mg | Freq: Four times a day (QID) | INTRAMUSCULAR | Status: DC | PRN

## 2019-09-17 MED ORDER — METOPROLOL TARTRATE 50 MG PO TABS
100.0000 mg | ORAL_TABLET | Freq: Two times a day (BID) | ORAL | Status: DC
  Administered 2019-09-17 (×2): 100 mg via ORAL
  Filled 2019-09-17 (×3): qty 2

## 2019-09-17 MED ORDER — ACETAMINOPHEN 650 MG RE SUPP: 650.0000 mg | Freq: Four times a day (QID) | RECTAL | Status: DC | PRN

## 2019-09-17 MED ORDER — FUROSEMIDE 10 MG/ML IJ SOLN
40.0000 mg | Freq: Two times a day (BID) | INTRAMUSCULAR | Status: DC
  Administered 2019-09-18: 06:00:00 40 mg via INTRAVENOUS
  Filled 2019-09-17: qty 4

## 2019-09-17 MED ORDER — CLOPIDOGREL BISULFATE 75 MG PO TABS
75.0000 mg | ORAL_TABLET | Freq: Every day | ORAL | Status: DC
  Administered 2019-09-17 – 2019-09-18 (×2): 75 mg via ORAL
  Filled 2019-09-17 (×2): qty 1

## 2019-09-17 MED ORDER — FUROSEMIDE 10 MG/ML IJ SOLN
40.0000 mg | Freq: Once | INTRAMUSCULAR | Status: AC
  Administered 2019-09-17: 40 mg via INTRAVENOUS
  Filled 2019-09-17: qty 4

## 2019-09-17 MED ORDER — LISINOPRIL 10 MG PO TABS
5.0000 mg | ORAL_TABLET | Freq: Every day | ORAL | Status: DC
  Administered 2019-09-17 – 2019-09-18 (×2): 5 mg via ORAL
  Filled 2019-09-17 (×2): qty 1

## 2019-09-17 MED ORDER — INFLUENZA VAC A&B SA ADJ QUAD 0.5 ML IM PRSY
0.5000 mL | PREFILLED_SYRINGE | INTRAMUSCULAR | Status: AC
  Administered 2019-09-18: 14:00:00 0.5 mL via INTRAMUSCULAR
  Filled 2019-09-17: qty 0.5

## 2019-09-17 MED ORDER — ACETAMINOPHEN 325 MG PO TABS: 650.0000 mg | ORAL_TABLET | ORAL | Status: DC | PRN

## 2019-09-17 NOTE — ED Notes (Signed)
Pt placed on 4L via [place] while drinking ginger ale. Pt testing his family to update as well.   NAD at this time and pt stated, "I feel completely better." Pt remains slightly short of breath after talking but is able to speak in complete sentences. Call bell in reach and pt verbalized understanding to press if he felt SOB.

## 2019-09-17 NOTE — Progress Notes (Addendum)
PROGRESS NOTE                                                                                                                                                                                                             Patient Demographics:    Matthew Zamora, is a 79 y.o. male, DOB - Mar 10, 1941, TZG:[id number]  Admit date - 09/17/2019   Admitting Physician Athena Masse, MD  Outpatient Primary MD for the patient is Maryland Pink, MD  LOS - 0  Outpatient Specialists: [facility] cardiology  Chief Complaint  Patient presents with  . Shortness of Breath       Brief Narrative 79 year old male with CAD, ischemic cardiomyopathy with last EF of 40-45%, chronic respiratory failure on home O2 only as needed, chronic kidney disease stage III, paroxysmal A. fib on Eliquis and hypertension presented with acute onset of respiratory distress.  Patient reports that he was feeling increasingly dyspneic for past few days. He was found to be tachypneic with O2 sat of 70% on room air per EMS and placed on nonrebreather and then to BiPAP. Chest x-ray with findings of acute CHF. Patient was able to be transitioned to nasal cannula in the ED after IV Lasix given and admitted to hospital service.    Subjective:   Seen and examined.  Reports his breathing better after receiving IV Lasix but still dyspneic on minimal exertion.   Assessment  & Plan :   Principal problem Acute on chronic combined systolic and diastolic CHF.  ([name]) Acute on chronic respiratory failure with hypoxia ([name]) No clear triggering symptoms.  Patient reports taking his meds as instructed, no recent illness, sick contact or travel. Currently stable on 2 L via nasal cannula.  Had elevated troponin peaked at 23 but given no new EKG changes and no chest discomfort appears to be related to demand ischemia. Continue IV Lasix 40 mg every 12 hours, daily weight and strict  I's/O.  Continue beta-blocker and lisinopril. Monitor electrolytes and replenish. Follow 2D echo.  Wean oxygen as tolerated.  Active problems Paroxysmal A. fib Rate controlled.  Continue metoprolol and Eliquis.  Coronary artery disease No chest pain symptoms.  Continue metoprolol.  Elevated troponin due to demand ischemia.  Chronic kidney disease stage IIIa Renal function at baseline.  Monitor while on diuretic.   Code Status : Full  code  Family Communication  : None at bedside  Disposition Plan  : Home possibly in the next 24-48 hours if adequately diuresed and dyspnea improved  Barriers For Discharge : Acute respiratory failure, acute CHF  Consults  : None  Procedures  : 2D echo (pending)  DVT Prophylaxis  : Eliquis  Lab Results  Component Value Date   PLT 175 09/17/2019    Antibiotics  :  Anti-infectives (From admission, onward)   None        Objective:   Vitals:   09/17/19 0800 09/17/19 0930 09/17/19 1000 09/17/19 1101  BP: 109/71 118/90 126/75 125/77  Pulse: 67 73 71 (!) 54  Resp: 13     Temp:      TempSrc:      SpO2: 99% 97% 100% 97%  Weight:      Height:        Wt Readings from Last 3 Encounters:  09/17/19 72.6 kg  12/31/18 72.6 kg  09/17/18 80 kg     Intake/Output Summary (Last 24 hours) at 09/17/2019 1219 Last data filed at 09/17/2019 1059 Gross per 24 hour  Intake --  Output 1250 ml  Net -1250 ml     Physical Exam  Gen: not in distress, fatigued HEENT:moist mucosa, supple neck Chest: Diminished breath sounds over left lung base, no rhonchi wheeze or crackles CVS: S1-S2 regular, no murmurs GI: soft, NT, ND,  Musculoskeletal: warm, trace pitting edema     Data Review:    CBC Recent Labs  Lab 09/17/19 0235  WBC 9.5  HGB 11.5*  HCT 35.3*  PLT 175  MCV 94.6  MCH 30.8  MCHC 32.6  RDW 12.3  LYMPHSABS 0.9  MONOABS 0.6  EOSABS 0.1  BASOSABS 0.0    Chemistries  Recent Labs  Lab 09/17/19 0235  NA 139  K 4.2  CL  101  CO2 29  GLUCOSE 132*  BUN 30*  CREATININE 1.86*  CALCIUM 9.3  AST 22  ALT 16  ALKPHOS 60  BILITOT 0.7   ------------------------------------------------------------------------------------------------------------------ No results for input(s): CHOL, HDL, LDLCALC, TRIG, CHOLHDL, LDLDIRECT in the last 72 hours.  No results found for: HGBA1C ------------------------------------------------------------------------------------------------------------------ No results for input(s): TSH, T4TOTAL, T3FREE, THYROIDAB in the last 72 hours.  Invalid input(s): FREET3 ------------------------------------------------------------------------------------------------------------------ No results for input(s): VITAMINB12, FOLATE, FERRITIN, TIBC, IRON, RETICCTPCT in the last 72 hours.  Coagulation profile No results for input(s): INR, PROTIME in the last 168 hours.  No results for input(s): DDIMER in the last 72 hours.  Cardiac Enzymes No results for input(s): CKMB, TROPONINI, MYOGLOBIN in the last 168 hours.  Invalid input(s): CK ------------------------------------------------------------------------------------------------------------------    Component Value Date/Time   BNP 518.0 (H) 09/17/2019 0235    Inpatient Medications  Scheduled Meds: . apixaban  5 mg Oral Q12H  . clopidogrel  75 mg Oral Daily  . furosemide  40 mg Intravenous Q12H  . lisinopril  5 mg Oral Daily  . metoprolol tartrate  100 mg Oral BID  . sodium chloride flush  3 mL Intravenous Q12H   Continuous Infusions: . sodium chloride     PRN Meds:.sodium chloride, acetaminophen **OR** acetaminophen, acetaminophen, morphine injection, ondansetron (ZOFRAN) IV, ondansetron **OR** ondansetron (ZOFRAN) IV, oxyCODONE, senna-docusate, sodium chloride flush  Micro Results Recent Results (from the past 240 hour(s))  Respiratory Panel by RT PCR (Flu A&B, Covid) - Nasopharyngeal Swab     Status: None   Collection Time:  09/17/19  2:35 AM   Specimen: Nasopharyngeal Swab  Result  Value Ref Range Status   SARS Coronavirus 2 by RT PCR NEGATIVE NEGATIVE Final    Comment: (NOTE) SARS-CoV-2 target nucleic acids are NOT DETECTED. The SARS-CoV-2 RNA is generally detectable in upper respiratoy specimens during the acute phase of infection. The lowest concentration of SARS-CoV-2 viral copies this assay can detect is 131 copies/mL. A negative result does not preclude SARS-Cov-2 infection and should not be used as the sole basis for treatment or other patient management decisions. A negative result may occur with  improper specimen collection/handling, submission of specimen other than nasopharyngeal swab, presence of viral mutation(s) within the areas targeted by this assay, and inadequate number of viral copies (<131 copies/mL). A negative result must be combined with clinical observations, patient history, and epidemiological information. The expected result is Negative. Fact Sheet for Patients:  PinkCheek.be Fact Sheet for Healthcare Providers:  GravelBags.it This test is not yet ap proved or cleared by the [place] FDA and  has been authorized for detection and/or diagnosis of SARS-CoV-2 by FDA under an Emergency Use Authorization (EUA). This EUA will remain  in effect (meaning this test can be used) for the duration of the COVID-19 declaration under Section 564(b)(1) of the Act, 21 U.S.C. section 360bbb-3(b)(1), unless the authorization is terminated or revoked sooner.    Influenza A by PCR NEGATIVE NEGATIVE Final   Influenza B by PCR NEGATIVE NEGATIVE Final    Comment: (NOTE) The Xpert Xpress SARS-CoV-2/FLU/RSV assay is intended as an aid in  the diagnosis of influenza from Nasopharyngeal swab specimens and  should not be used as a sole basis for treatment. Nasal washings and  aspirates are unacceptable for Xpert Xpress SARS-CoV-2/FLU/RSV   testing. Fact Sheet for Patients: PinkCheek.be Fact Sheet for Healthcare Providers: GravelBags.it This test is not yet approved or cleared by the [place] FDA and  has been authorized for detection and/or diagnosis of SARS-CoV-2 by  FDA under an Emergency Use Authorization (EUA). This EUA will remain  in effect (meaning this test can be used) for the duration of the  Covid-19 declaration under Section 564(b)(1) of the Act, 21  U.S.C. section 360bbb-3(b)(1), unless the authorization is  terminated or revoked. Performed at [facility], [address], [place], [place] [address]     Radiology Reports DG Chest [place] 1 View  Result Date: 09/17/2019 CLINICAL DATA:  Shortness of breath EXAM: PORTABLE CHEST 1 VIEW COMPARISON:  09/18/2018 FINDINGS: Cardiac shadow is stable. Defibrillator is again noted and stable. Increased vascular congestion is seen. Mild interstitial edema is noted as well. No sizable effusion is seen. No bony abnormality is noted. IMPRESSION: Changes consistent with congestive failure. Electronically Signed   By: Inez Catalina M.D.   On: 09/17/2019 02:40    Time Spent in minutes  20   Li Bobo M.D on 09/17/2019 at 12:19 PM  Between 7am to 7pm - Pager - [phone]  After 7pm go to [email or web address] - password [facility]  [facility] -  Office  [phone]

## 2019-09-17 NOTE — ED Notes (Signed)
Pt given breakfast tray

## 2019-09-17 NOTE — H&P (Signed)
History and Physical    Matthew Zamora IHK:[id number] DOB: 25-May-1941 DOA: 09/17/2019  PCP: [place] Pink, MD   Patient coming from:home  I have personally briefly reviewed patient's old medical records in [place]  Chief Complaint: shortness of breath  HPI: Matthew Zamora is a 79 y.o. 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 fib on Eliquis, hypertension who presented to the emergency room by EMS in severe respiratory distress.  History is limited for that reason.  Patient was satting at 70% on room air when EMS arrived and he was placed on nonrebreather.  ED Course: On arrival to the emergency room he had increased work of breathing using accessory muscles.  He was transitioned to BiPAP.  He was afebrile with BP 127/90, HR 96, respirations 23 with O2 sat 96% on BiPAP.  Blood work significant for BNP 518 and troponin 23.  Creatinine was 1.86 which is about his baseline for CKD 3.  Chest x-ray consistent with CHF.  EKG showed no acute ST-T wave changes.  Patient was treated with IV Lasix.  He was transitioned to O2 via nasal cannula and off BiPAP by time of admission.  By the time of my evaluation patient was resting comfortably on O2 via nasal cannula.  He said he felt a bit of chest discomfort, retrosternally nonradiating, worse with deep breathing.  Review of Systems: As per HPI otherwise 10 point review of systems negative.    Past Medical History:  Diagnosis Date  . Abdominal aortic aneurysm (AAA) ([place]) 05/13/15   seen on ct scan  . 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 Anemia   . 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 CAD (coronary artery disease)   . Cataract cortical, senile   . CHF (congestive heart failure) ([place])   . Chronic hoarseness   . Exocrine pancreatic insufficiency    . H. pylori infection   . History of hepatitis   . Hyperlipidemia   . Hypertension   . 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 PAF (paroxysmal atrial fibrillation) ([place])   . Prostate CA [facility])     Past Surgical History:  Procedure Laterality Date  . CATARACT EXTRACTION    . COLONOSCOPY  10/06/2014, 09/18/2004, 03/14/2009  . ESOPHAGOGASTRODUODENOSCOPY  10/06/2014, 03/18/2001, 03/14/2009  . ESOPHAGOGASTRODUODENOSCOPY (EGD) WITH PROPOFOL N/A 05/07/2018   Procedure: ESOPHAGOGASTRODUODENOSCOPY (EGD) WITH PROPOFOL;  Surgeon: Toledo, [name], MD;  Location: [facility] ENDOSCOPY;  Service: Gastroenterology;  Laterality: N/A;  . FLEXIBLE SIGMOIDOSCOPY  08/26/1990  . INSERTION OF ICD    . PROSTATE SURGERY    . TONSILLECTOMY       reports that he quit smoking about 20 years ago. His smoking use included cigarettes. He has a 38.00 pack-year smoking history. He has never used smokeless tobacco. He reports that he does not drink alcohol. No history on file for drug.  No Known Allergies  Family History  Problem Relation Age of Onset  . Heart attack Mother   . Heart attack Father      Prior to Admission medications   Medication Sig Start Date End Date Taking? Authorizing Provider  albuterol (VENTOLIN HFA) 108 (90 Base) MCG/ACT inhaler Inhale into the lungs as needed.  09/24/18   [provider]  aspirin EC 81 MG tablet Take 81 mg by mouth daily.    [provider]  atorvastatin (LIPITOR) 40 MG tablet Take 40 mg by mouth daily.    [provider]  clopidogrel (PLAVIX) 75 MG tablet Take 75 mg by mouth daily. 04/15/18   [provider]  ELIQUIS 5 MG TABS tablet Take 5 mg by mouth every 12 (twelve) hours. 10/21/18   [provider]  furosemide (LASIX) 40 MG tablet Take 20 mg by mouth daily.    [provider]  gabapentin (NEURONTIN) 300 MG capsule TAKE 1 CAPSULE BY MOUTH NIGHTLY. MAY INCREASE TO 2 CAPSULES AT NIGHT AFTER ONE WEEK IF NO IMPROVEMENT. 12/17/18   [provider]  lipase/protease/amylase (CREON) 12000 units CPEP capsule Take 24,000 Units by mouth 2 (two) times daily at 10 am and 4 pm.    [provider]  lisinopril (PRINIVIL,ZESTRIL) 5 MG tablet Take 5 mg by mouth daily.    [provider]  magnesium (MAGTAB) 84 MG (7MEQ) TBCR SR tablet Take 84 mg by mouth daily.    [provider]  Melatonin 1 MG TABS Take 1 tablet by mouth at bedtime as needed. 12/27/13   [provider]  metoprolol tartrate (LOPRESSOR) 100 MG tablet Take 1 tablet (100 mg total) by mouth 2 (two) times daily. 05/13/18   Max Sane, MD  Multiple Vitamins-Minerals (MULTIVITAMIN WITH MINERALS) tablet Take 1 tablet by mouth daily.    [provider]  omeprazole (PRILOSEC) 20 MG capsule Take 20 mg by mouth daily.    [provider]  oxybutynin (DITROPAN XL) 15 MG 24 hr tablet Take 15 mg by mouth at bedtime.    [provider]  tadalafil (CIALIS) 20 MG tablet TAKE 1 TABLET BY MOUTH ONCE DAILY AS NEEDED FOR ERECTILE DYSFUNCTION 07/13/18   [provider]    Physical Exam: Vitals:   09/17/19 0330 09/17/19 0345 09/17/19 0400 09/17/19 0415  BP: 121/77 113/76 116/73 112/73  Pulse: 89 84 84 80  Resp: 17 19 15 13   Temp:      TempSrc:      SpO2: 98% 99% 99% 98%  Weight:      Height:         Vitals:   09/17/19 0330 09/17/19 0345 09/17/19 0400 09/17/19 0415  BP: 121/77 113/76 116/73 112/73  Pulse: 89 84 84 80  Resp: 17 19 15 13   Temp:      TempSrc:      SpO2: 98% 99% 99% 98%  Weight:      Height:        Constitutional: Alert and awake, oriented x3, not in any acute distress. Eyes: PERLA, EOMI, irises appear normal, anicteric sclera,  ENMT: external ears and nose appear normal, normal hearing             Lips appears normal, oropharynx mucosa, tongue, posterior pharynx appear normal  Neck: neck appears normal, no masses, normal ROM, no thyromegaly, no JVD  CVS: S1-S2 clear, no murmur rubs or  gallops,  , no carotid bruits, pedal pulses palpable, No LE edema Respiratory:  Diminished at bases bilaterally, no wheezing, rales or rhonchi. Respiratory effort normal. No accessory muscle use.  Abdomen: soft nontender, nondistended, normal bowel sounds, no hepatosplenomegaly, no hernias Musculoskeletal: : no cyanosis, clubbing , no contractures or atrophy Neuro: Cranial nerves II-XII intact, sensation, reflexes normal, strength Psych: judgement and insight appear normal, stable mood and affect,  Skin: no rashes or lesions or ulcers, no induration or nodules   Labs on Admission: I have personally reviewed following labs and imaging  studies  CBC: Recent Labs  Lab 09/17/19 0235  WBC 9.5  NEUTROABS 7.9*  HGB 11.5*  HCT 35.3*  MCV 94.6  PLT 102   Basic Metabolic Panel: Recent Labs  Lab 09/17/19 0235  NA 139  K 4.2  CL 101  CO2 29  GLUCOSE 132*  BUN 30*  CREATININE 1.86*  CALCIUM 9.3   GFR: Estimated Creatinine Clearance: 8.3 mL/min (A) (by C-G formula based on SCr of 1.86 mg/dL (H)). Liver Function Tests: Recent Labs  Lab 09/17/19 0235  AST 22  ALT 16  ALKPHOS 60  BILITOT 0.7  PROT 7.2  ALBUMIN 4.4   No results for input(s): LIPASE, AMYLASE in the last 168 hours. No results for input(s): AMMONIA in the last 168 hours. Coagulation Profile: No results for input(s): INR, PROTIME in the last 168 hours. Cardiac Enzymes: No results for input(s): CKTOTAL, CKMB, CKMBINDEX, TROPONINI in the last 168 hours. BNP (last 3 results) No results for input(s): PROBNP in the last 8760 hours. HbA1C: No results for input(s): HGBA1C in the last 72 hours. CBG: No results for input(s): GLUCAP in the last 168 hours. Lipid Profile: No results for input(s): CHOL, HDL, LDLCALC, TRIG, CHOLHDL, LDLDIRECT in the last 72 hours. Thyroid Function Tests: No results for input(s): TSH, T4TOTAL, FREET4, T3FREE, THYROIDAB in the last 72 hours. Anemia Panel: No results for input(s):  VITAMINB12, FOLATE, FERRITIN, TIBC, IRON, RETICCTPCT in the last 72 hours. Urine analysis:    Component Value Date/Time   COLORURINE YELLOW (A) 09/17/2018 0833   APPEARANCEUR HAZY (A) 09/17/2018 0833   LABSPEC 1.020 09/17/2018 0833   PHURINE 5.0 09/17/2018 [place] 09/17/2018 0833   HGBUR NEGATIVE 09/17/2018 0833   BILIRUBINUR NEGATIVE 09/17/2018 0833   KETONESUR NEGATIVE 09/17/2018 0833   PROTEINUR NEGATIVE 09/17/2018 0833   NITRITE NEGATIVE 09/17/2018 0833   LEUKOCYTESUR SMALL (A) 09/17/2018 0833    Radiological Exams on Admission: DG Chest Port 1 View  Result Date: 09/17/2019 CLINICAL DATA:  Shortness of breath EXAM: PORTABLE CHEST 1 VIEW COMPARISON:  09/18/2018 FINDINGS: Cardiac shadow is stable. Defibrillator is again noted and stable. Increased vascular congestion is seen. Mild interstitial edema is noted as well. No sizable effusion is seen. No bony abnormality is noted. IMPRESSION: Changes consistent with congestive failure. Electronically Signed   By: Inez Catalina M.D.   On: 09/17/2019 02:40    EKG: Independently reviewed.   Assessment/Plan Active Problems:   Acute on chronic combined systolic and diastolic CHF secondary to ischemic cardiomyopathy   Acute on chronic respiratory failure with hypoxia ([place]) -Patient presented in severe respiratory distress needing NRB by EMS and subsequently BiPAP, improved and already weaned to O2 via nasal cannula -Secondary to acute exacerbation of CHF -Troponin 23.  Continue to trend to evaluate for ACS as trigger -Daily weights, intake and output monitoring and fluid restriction -Echocardiogram in the a.m. -Wean O2 as tolerated  Paroxysmal atrial fibrillation ([name]) -Continue Eliquis and metoprolol    CAD (coronary artery disease) -Denies chest pain, no acute findings on EKG -First troponin 23.  Continue to trend    CKD (chronic kidney disease) stage 3, GFR 30-59 ml/min -At baseline     DVT prophylaxis: On  Eliquis Code Status: full code  Family Communication:  none  Disposition Plan: Back to previous home environment Consults called: none  Status: Inpatient   Athena Masse MD [facility]     09/17/2019, 4:42 AM

## 2019-09-17 NOTE — ED Notes (Signed)
Pt placed on Bipap

## 2019-09-17 NOTE — TOC Initial Note (Signed)
Transition of Care [facility]) - Initial/Assessment Note    Patient Details  Name: Matthew Zamora MRN: [id number] Date of Birth: Sep 04, 1940  [facility] CM/SW Contact:    Anselm Pancoast, RN Phone Number: 09/17/2019, 10:31 AM  Clinical Narrative:                 Spoke with patient at bedside. Patient lives home alone with lifealert button and has strong family support as needed. Patient is independent with ADl's and drives to [place] and restaurants as needed. Patient had previous stay at [facility] and left after a few days. Patient states he typically weighs daily and records his weight, O2 requirements and fluid intake however had gotten off of tracking and understands the importance of returning to that habit. Patient has defibrillator and is very involved in tracking his healthcare. Has grab bars, ramp and raised toilet seat at home. Patient open to possible home health if needed at discharge but no interest in SNF placement. Patient recently completed both COVID vaccines. No needs or concerns at this time.   Expected Discharge Plan: Home/Self Care     Patient Goals and CMS Choice Patient states their goals for this hospitalization and ongoing recovery are:: get back home      Expected Discharge Plan and Services Expected Discharge Plan: Home/Self Care       Living arrangements for the past 2 months: Mobile Home                                      Prior Living Arrangements/Services Living arrangements for the past 2 months: Mobile Home Lives with:: Self(strong family support assists as needed) Patient language and need for interpreter reviewed:: Yes        Need for Family Participation in Patient Care: Yes (Comment) Care giver support system in place?: Yes (comment) Current home services: DME(cane) Criminal Activity/Legal Involvement Pertinent to Current Situation/Hospitalization: No - Comment as needed  Activities of Daily Living      Permission  Sought/Granted                  Emotional Assessment Appearance:: Appears stated age Attitude/Demeanor/Rapport: Engaged, Self-Confident Affect (typically observed): Accepting Orientation: : Oriented to Self, Oriented to Place, Oriented to  Time, Oriented to Situation Alcohol / Substance Use: Never Used Psych Involvement: No (comment)  Admission diagnosis:  CHF exacerbation ([place]) [I50.9] Patient Active Problem List   Diagnosis Date Noted  . Acute on chronic combined systolic and diastolic CHF (congestive heart failure) ([place]) 09/17/2019  . CAD (coronary artery disease) 09/17/2019  . Ischemic cardiomyopathy 09/17/2019  . CKD (chronic kidney disease) stage 3, GFR 30-59 ml/min 09/17/2019  . CHF exacerbation ([place]) 09/17/2019  . Acute hypoxemic respiratory failure ([place]) 09/17/2018  . AAA (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 ([place]) 07/06/2018  . Essential hypertension 07/06/2018  . Hyperlipidemia 07/06/2018  . Paroxysmal atrial fibrillation ([name])   . Acute on chronic respiratory failure with hypoxia ([name]) 04/26/2018   PCP:  [place] Pink, MD Pharmacy:   [facility] [address]), [place] [address] [place]) [place] [address] Phone: [phone] Fax: [phone]     Social Determinants of Health (SDOH) Interventions    Readmission Risk Interventions No flowsheet data found.

## 2019-09-17 NOTE — ED Notes (Signed)
Pt eating. Bed locked low. Rails up. Call bell within reach. Lights dimmed for pt as requested. Pt watching tv. Denies any needs currently.

## 2019-09-17 NOTE — ED Notes (Signed)
Pt ate about 20% of breakfast

## 2019-09-17 NOTE — ED Provider Notes (Signed)
[facility] Emergency Department Provider Note  ____________________________________________   First MD Initiated Contact with Patient 09/17/19 0225     (approximate)  I have reviewed the triage vital signs and the nursing notes.   HISTORY  Chief Complaint Shortness of Breath    HPI Matthew Zamora is a 79 y.o. male with below list of medical conditions including congestive heart failure presents to the emergency department via EMS due to acute onset of dyspnea tonight.  Per EMS on their arrival patient's oxygen saturation was 70%.  Patient was placed on a nonrebreather with oxygen saturation of 100% on arrival.  However patient continued increased work of breathing on arrival.  Patient denies any fever or cough.  Patient denies any known sick contact.  Patient denies any chest pain.  Patient denies any lower extremity pain or swelling.        Past Medical History:  Diagnosis Date  . Abdominal aortic aneurysm (AAA) ([place]) 05/13/15   seen on ct scan  . 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 Anemia   . 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 CAD (coronary artery disease)   . Cataract cortical, senile   . CHF (congestive heart failure) ([place])   . Chronic hoarseness   . Exocrine pancreatic insufficiency   . H. pylori infection   . History of hepatitis   . Hyperlipidemia   . Hypertension   . 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 PAF (paroxysmal atrial fibrillation) ([place])   . Prostate CA [facility])     Patient Active Problem List   Diagnosis Date Noted  . Acute on chronic combined systolic and diastolic CHF (congestive heart failure) ([place]) 09/17/2019  . CAD (coronary artery disease) 09/17/2019  . Ischemic cardiomyopathy 09/17/2019  . CKD (chronic kidney disease) stage 3, GFR 30-59 ml/min 09/17/2019  . CHF exacerbation ([place]) 09/17/2019  . Acute hypoxemic respiratory failure ([place]) 09/17/2018  . AAA (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  ([place]) 07/06/2018  . Essential hypertension 07/06/2018  . Hyperlipidemia 07/06/2018  . Paroxysmal atrial fibrillation ([name])   . Acute on chronic respiratory failure with hypoxia ([place]) 04/26/2018    Past Surgical History:  Procedure Laterality Date  . CATARACT EXTRACTION    . COLONOSCOPY  10/06/2014, 09/18/2004, 03/14/2009  . ESOPHAGOGASTRODUODENOSCOPY  10/06/2014, 03/18/2001, 03/14/2009  . ESOPHAGOGASTRODUODENOSCOPY (EGD) WITH PROPOFOL N/A 05/07/2018   Procedure: ESOPHAGOGASTRODUODENOSCOPY (EGD) WITH PROPOFOL;  Surgeon: Toledo, [name], MD;  Location: [facility] ENDOSCOPY;  Service: Gastroenterology;  Laterality: N/A;  . FLEXIBLE SIGMOIDOSCOPY  08/26/1990  . INSERTION OF ICD    . PROSTATE SURGERY    . TONSILLECTOMY      Prior to Admission medications   Medication Sig Start Date End Date Taking? Authorizing Provider  albuterol (VENTOLIN HFA) 108 (90 Base) MCG/ACT inhaler Inhale into the lungs as needed.  09/24/18   [provider]  aspirin EC 81 MG tablet Take 81 mg by mouth daily.    [provider]  atorvastatin (LIPITOR) 40 MG tablet Take 40 mg by mouth daily.    [provider]  clopidogrel (PLAVIX) 75 MG tablet Take 75 mg by mouth daily. 04/15/18   [provider]  ELIQUIS 5 MG TABS tablet Take 5 mg by mouth every 12 (twelve) hours. 10/21/18   [provider]  furosemide (LASIX) 40 MG tablet Take 20 mg by mouth daily.    [provider]  gabapentin (NEURONTIN) 300 MG capsule TAKE 1 CAPSULE BY MOUTH NIGHTLY. MAY INCREASE TO 2  CAPSULES AT NIGHT AFTER ONE WEEK IF NO IMPROVEMENT. 12/17/18   [provider]  lipase/protease/amylase (CREON) 12000 units CPEP capsule Take 24,000 Units by mouth 2 (two) times daily at 10 am and 4 pm.    [provider]  lisinopril (PRINIVIL,ZESTRIL) 5 MG tablet Take 5 mg by mouth daily.    [provider]  magnesium (MAGTAB) 84 MG (7MEQ) TBCR SR tablet Take 84 mg by mouth daily.    [provider]  Melatonin 1 MG TABS Take 1 tablet by mouth at bedtime as needed. 12/27/13   [provider]  metoprolol tartrate (LOPRESSOR) 100 MG tablet Take 1 tablet (100 mg total) by mouth 2 (two) times daily. 05/13/18   Max Sane, MD  Multiple Vitamins-Minerals (MULTIVITAMIN WITH MINERALS) tablet Take 1 tablet by mouth daily.    [provider]  omeprazole (PRILOSEC) 20 MG capsule Take 20 mg by mouth daily.    [provider]  oxybutynin (DITROPAN XL) 15 MG 24 hr tablet Take 15 mg by mouth at bedtime.    [provider]  tadalafil (CIALIS) 20 MG tablet TAKE 1 TABLET BY MOUTH ONCE DAILY AS NEEDED FOR ERECTILE DYSFUNCTION 07/13/18   [provider]    Allergies Patient has no known allergies.  Family History  Problem Relation Age of Onset  . Heart attack Mother   . Heart attack Father     Social History Social History   Tobacco Use  . Smoking status: Former Smoker    Packs/day: 1.00    Years: 38.00    Pack years: 38.00    Types: Cigarettes    Quit date: 06/18/1999    Years since quitting: 20.2  . Smokeless tobacco: Never Used  Substance Use Topics  . Alcohol use: No    Alcohol/week: 0.0 standard drinks  . Drug use: Not on file    Review of Systems Constitutional: No fever/chills Eyes: No visual changes. ENT: No sore throat. Cardiovascular: Denies chest pain. Respiratory: Positive for shortness of breath. Gastrointestinal: No abdominal pain.  No nausea, no vomiting.  No diarrhea.  No constipation. Genitourinary: Negative for dysuria. Musculoskeletal: Negative for neck pain.  Negative for back pain. Integumentary: Negative for rash. Neurological: Negative for headaches, focal weakness or numbness.   ____________________________________________   PHYSICAL EXAM:  VITAL SIGNS: ED Triage Vitals  Enc Vitals Group     BP 09/17/19 0230 127/90     Pulse Rate 09/17/19 0230 100     Resp 09/17/19 0230 (!) 21     Temp  09/17/19 0233 97.8 F (36.6 C)     Temp Source 09/17/19 0233 Oral     SpO2 09/17/19 0230 95 %     Weight 09/17/19 0234 72.6 kg (160 lb 0.9 oz)     Height 09/17/19 0234 0.762 m (2\' 6" )     Head Circumference --      Peak Flow --      Pain Score 09/17/19 0234 0     Pain Loc --      Pain Edu? --      Excl. in [place]? --      Constitutional: Alert and oriented.  Apparent respiratory distress Eyes: Conjunctivae are normal.  Mouth/Throat: Patient is wearing a mask. Neck: No stridor.  No meningeal signs.   Cardiovascular: Normal rate, regular rhythm. Good peripheral circulation. Grossly normal heart sounds. Respiratory: Tachypnea, positive accessory respiratory muscle use, bibasilar rhonchi/rales Gastrointestinal: Soft and nontender. No distention.  Musculoskeletal: No lower extremity  tenderness nor edema. No gross deformities of extremities. Neurologic:  Normal speech and language. No gross focal neurologic deficits are appreciated.  Skin:  Skin is warm, dry and intact. Psychiatric: Mood and affect are normal. Speech and behavior are normal.  ____________________________________________   LABS (all labs ordered are listed, but only abnormal results are displayed)  Labs Reviewed  CBC WITH DIFFERENTIAL/PLATELET - Abnormal; Notable for the following components:      Result Value   RBC 3.73 (*)    Hemoglobin 11.5 (*)    HCT 35.3 (*)    Neutro Abs 7.9 (*)    All other components within normal limits  BRAIN NATRIURETIC PEPTIDE - Abnormal; Notable for the following components:   B Natriuretic Peptide 518.0 (*)    All other components within normal limits  COMPREHENSIVE METABOLIC PANEL - Abnormal; Notable for the following components:   Glucose, Bld 132 (*)    BUN 30 (*)    Creatinine, Ser 1.86 (*)    GFR calc non Af Amer 34 (*)    GFR calc Af Amer 40 (*)    All other components within normal limits  TROPONIN I (HIGH SENSITIVITY) - Abnormal; Notable for the following components:    Troponin I (High Sensitivity) 23 (*)    All other components within normal limits  TROPONIN I (HIGH SENSITIVITY) - Abnormal; Notable for the following components:   Troponin I (High Sensitivity) 42 (*)    All other components within normal limits  RESPIRATORY PANEL BY RT PCR (FLU A&B, COVID)  POC SARS CORONAVIRUS 2 AG -  ED  POC SARS CORONAVIRUS 2 AG   ____________________________________________  EKG  ED ECG REPORT I, [place] N BROWN, the attending physician, personally viewed and interpreted this ECG.   Date: 09/17/2019  EKG Time: 2:29 AM  Rate: 98  Rhythm: Normal sinus rhythm  Axis: Normal  Intervals: Normal  ST&T Change: None  ____________________________________________  RADIOLOGY I, [place] N BROWN, personally viewed and evaluated these images (plain radiographs) as part of my medical decision making, as well as reviewing the written report by the radiologist.  ED MD interpretation: Chest x-ray findings consistent with congestive heart failure per radiologist.  Official radiology report(s): DG Chest Port 1 View  Result Date: 09/17/2019 CLINICAL DATA:  Shortness of breath EXAM: PORTABLE CHEST 1 VIEW COMPARISON:  09/18/2018 FINDINGS: Cardiac shadow is stable. Defibrillator is again noted and stable. Increased vascular congestion is seen. Mild interstitial edema is noted as well. No sizable effusion is seen. No bony abnormality is noted. IMPRESSION: Changes consistent with congestive failure. Electronically Signed   By: Inez Catalina M.D.   On: 09/17/2019 02:40    ____________________________________________   PROCEDURES    .Critical Care Performed by: Gregor Hams, MD Authorized by: Gregor Hams, MD   Critical care provider statement:    Critical care time (minutes):  30   Critical care time was exclusive of:  Separately billable procedures and treating other patients   Critical care was necessary to treat or prevent imminent or life-threatening  deterioration of the following conditions:  Respiratory failure   Critical care was time spent personally by me on the following activities:  Development of treatment plan with patient or surrogate, discussions with consultants, evaluation of patient's response to treatment, examination of patient, obtaining history from patient or surrogate, ordering and performing treatments and interventions, ordering and review of laboratory studies, ordering and review of radiographic studies, pulse oximetry, re-evaluation of patient's condition and review of  old charts     ____________________________________________   INITIAL IMPRESSION / MDM / [place] / ED COURSE  As part of my medical decision making, I reviewed the following data within the electronic MEDICAL RECORD NUMBER 79 year old male presented with above-stated history and physical exam secondary to respiratory distress with differential diagnosis including but not limited to CHF exacerbation, ACS pneumonia etc.  Given clinical evaluation findings patient placed on BiPAP Lasix 40 mg IV given after reviewing the patient's chest x-ray which was consistent with CHF.  Patient remained on BiPAP for approximately 2 hours with respiratory status markedly improved at this time patient is no longer tachypneic with no longer requiring accessory muscle use.  As such BiPAP was discontinued patient placed on 2 L nasal cannula.  Patient discussed with Dr. Damita Dunnings for hospital admission further evaluation and management.           ____________________________________________  FINAL CLINICAL IMPRESSION(S) / ED DIAGNOSES  Final diagnoses:  Acute on chronic systolic congestive heart failure ([place])     MEDICATIONS GIVEN DURING THIS VISIT:  Medications  metoprolol tartrate (LOPRESSOR) tablet 100 mg (has no administration in time range)  lisinopril (ZESTRIL) tablet 5 mg (has no administration in time range)  apixaban (ELIQUIS) tablet 5 mg (has no  administration in time range)  clopidogrel (PLAVIX) tablet 75 mg (has no administration in time range)  sodium chloride flush (NS) 0.9 % injection 3 mL (has no administration in time range)  sodium chloride flush (NS) 0.9 % injection 3 mL (has no administration in time range)  0.9 %  sodium chloride infusion (has no administration in time range)  acetaminophen (TYLENOL) tablet 650 mg (has no administration in time range)  ondansetron (ZOFRAN) injection 4 mg (has no administration in time range)  acetaminophen (TYLENOL) tablet 650 mg (has no administration in time range)    Or  acetaminophen (TYLENOL) suppository 650 mg (has no administration in time range)  oxyCODONE (Oxy IR/ROXICODONE) immediate release tablet 5 mg (has no administration in time range)  morphine 2 MG/ML injection 2 mg (has no administration in time range)  senna-docusate (Senokot-S) tablet 1 tablet (has no administration in time range)  ondansetron (ZOFRAN) tablet 4 mg (has no administration in time range)    Or  ondansetron (ZOFRAN) injection 4 mg (has no administration in time range)  furosemide (LASIX) injection 40 mg (40 mg Intravenous Not Given 09/17/19 0550)  furosemide (LASIX) injection 40 mg (40 mg Intravenous Given 09/17/19 0303)     ED Discharge Orders    None      *Please note:  Matthew Zamora was evaluated in Emergency Department on 09/17/2019 for the symptoms described in the history of present illness. He was evaluated in the context of the global COVID-19 pandemic, which necessitated consideration that the patient might be at risk for infection with the SARS-CoV-2 virus that causes COVID-19. Institutional protocols and algorithms that pertain to the evaluation of patients at risk for COVID-19 are in a state of rapid change based on information released by regulatory bodies including the CDC and federal and state organizations. These policies and algorithms were followed during the patient's care in the ED.   Some ED evaluations and interventions may be delayed as a result of limited staffing during the pandemic.*  Note:  This document was prepared using Dragon voice recognition software and may include unintentional dictation errors.   Gregor Hams, MD 09/17/19 [phone]

## 2019-09-17 NOTE — TOC Initial Note (Signed)
Transition of Care [facility]) - Initial/Assessment Note    Patient Details  Name: Matthew Zamora MRN: [id number] Date of Birth: 1941-02-06  [facility] CM/SW Contact:    Anselm Pancoast, RN Phone Number: 09/17/2019, 9:43 AM  Clinical Narrative:                 Left Voicemail for son, [name] requesting callback to discuss discharge plans/needs.         Patient Goals and CMS Choice        Expected Discharge Plan and Services                                                Prior Living Arrangements/Services                       Activities of Daily Living      Permission Sought/Granted                  Emotional Assessment              Admission diagnosis:  CHF exacerbation [facility]) [I50.9] Patient Active Problem List   Diagnosis Date Noted  . Acute on chronic combined systolic and diastolic CHF (congestive heart failure) ([place]) 09/17/2019  . CAD (coronary artery disease) 09/17/2019  . Ischemic cardiomyopathy 09/17/2019  . CKD (chronic kidney disease) stage 3, GFR 30-59 ml/min 09/17/2019  . CHF exacerbation ([place]) 09/17/2019  . Acute hypoxemic respiratory failure ([place]) 09/17/2018  . AAA (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 ([place]) 07/06/2018  . Essential hypertension 07/06/2018  . Hyperlipidemia 07/06/2018  . Paroxysmal atrial fibrillation ([name])   . Acute on chronic respiratory failure with hypoxia ([name]) 04/26/2018   PCP:  [place] Pink, MD Pharmacy:   [facility] [address]), [place] [address] [place]) [place] [address] Phone: [phone] Fax: [phone]     Social Determinants of Health (SDOH) Interventions    Readmission Risk Interventions No flowsheet data found.

## 2019-09-17 NOTE — ED Notes (Signed)
Pt given cordless phone to call family due to not having cell service. Pt voices no complaints or needs at this time

## 2019-09-17 NOTE — ED Notes (Signed)
Report given to Dee, RN 

## 2019-09-17 NOTE — ED Triage Notes (Signed)
Pt to ED reporting SOB that started tonight with EMS reporting room air saturation of 70%. Pt arrived to ED on NRB at 100% saturation. Pt placed on 2L immediately and is remaining at 96% with increased WOB noted. Pt able to speak but not in complete sentences. Congested cough noted.

## 2019-09-17 NOTE — ED Notes (Signed)
Pt given lunch tray. No needs or concerns voiced at this time

## 2019-09-17 NOTE — Progress Notes (Signed)
*  PRELIMINARY RESULTS* Echocardiogram 2D Echocardiogram has been performed.  Matthew Zamora 09/17/2019, 6:27 PM

## 2019-09-18 DIAGNOSIS — I255 Ischemic cardiomyopathy: Secondary | ICD-10-CM

## 2019-09-18 DIAGNOSIS — I480 Paroxysmal atrial fibrillation: Secondary | ICD-10-CM

## 2019-09-18 DIAGNOSIS — I5043 Acute on chronic combined systolic (congestive) and diastolic (congestive) heart failure: Secondary | ICD-10-CM

## 2019-09-18 DIAGNOSIS — J9621 Acute and chronic respiratory failure with hypoxia: Secondary | ICD-10-CM | POA: Diagnosis not present

## 2019-09-18 DIAGNOSIS — I509 Heart failure, unspecified: Secondary | ICD-10-CM

## 2019-09-18 LAB — BASIC METABOLIC PANEL
Anion gap: 5 (ref 5–15)
BUN: 25 mg/dL — ABNORMAL HIGH (ref 8–23)
CO2: 32 mmol/L (ref 22–32)
Calcium: 9.2 mg/dL (ref 8.9–10.3)
Chloride: 100 mmol/L (ref 98–111)
Creatinine, Ser: 1.48 mg/dL — ABNORMAL HIGH (ref 0.61–1.24)
GFR calc Af Amer: 52 mL/min — ABNORMAL LOW (ref >60)
GFR calc non Af Amer: 45 mL/min — ABNORMAL LOW (ref >60)
Glucose, Bld: 101 mg/dL — ABNORMAL HIGH (ref 70–99)
Potassium: 4 mmol/L (ref 3.5–5.1)
Sodium: 137 mmol/L (ref 135–145)

## 2019-09-18 LAB — BRAIN NATRIURETIC PEPTIDE: B Natriuretic Peptide: 511 pg/mL — ABNORMAL HIGH (ref 0.0–100.0)

## 2019-09-18 LAB — ECHOCARDIOGRAM COMPLETE
Height: 30 in
Weight: 2560.86 oz

## 2019-09-18 MED ORDER — FUROSEMIDE 40 MG PO TABS
40.0000 mg | ORAL_TABLET | Freq: Every day | ORAL | Status: DC
  Administered 2019-09-18: 40 mg via ORAL
  Filled 2019-09-18: qty 1

## 2019-09-18 MED ORDER — MELATONIN 5 MG PO TABS: 2.5000 mg | ORAL_TABLET | Freq: Every evening | ORAL | Status: DC | PRN

## 2019-09-18 MED ORDER — METOPROLOL SUCCINATE ER 100 MG PO TB24: 100.0000 mg | ORAL_TABLET | Freq: Two times a day (BID) | ORAL | 0 refills | Status: DC

## 2019-09-18 MED ORDER — MELATONIN 5 MG PO TABS
10.0000 mg | ORAL_TABLET | Freq: Every day | ORAL | Status: DC
  Administered 2019-09-18: 10 mg via ORAL
  Filled 2019-09-18: qty 2

## 2019-09-18 MED ORDER — METOPROLOL TARTRATE 100 MG PO TABS: 100.0000 mg | ORAL_TABLET | Freq: Two times a day (BID) | ORAL | Status: DC

## 2019-09-18 MED ORDER — CALCIUM CARBONATE ANTACID 500 MG PO CHEW
1.0000 | CHEWABLE_TABLET | Freq: Every day | ORAL | Status: DC
  Administered 2019-09-18: 11:00:00 200 mg via ORAL
  Filled 2019-09-18: qty 1

## 2019-09-18 MED ORDER — ROSUVASTATIN CALCIUM 10 MG PO TABS: 20.0000 mg | ORAL_TABLET | Freq: Every day | ORAL | Status: DC

## 2019-09-18 NOTE — Care Management CC44 (Signed)
Condition Code 44 Documentation Completed  Patient Details  Name: Matthew Zamora MRN: [id number] Date of Birth: [birth year]   Condition Code 44 given:  Yes Patient signature on Condition Code 44 notice:  Yes Documentation of 2 MD's agreement:  Yes Code 44 added to claim:  Yes    Ardean Larsen, [place] 09/18/2019, 3:51 PM

## 2019-09-18 NOTE — Care Management CC44 (Deleted)
Condition Code 44 Documentation Completed  Patient Details  Name: Matthew Zamora MRN: [id number] Date of Birth: 1940-08-29   Condition Code 44 given:    Patient signature on Condition Code 44 notice:    Documentation of 2 MD's agreement:    Code 44 added to claim:       Ardean Larsen, [place] 09/18/2019, 3:32 PM

## 2019-09-18 NOTE — Discharge Instructions (Signed)
Heart Failure, Self Care Heart failure is a serious condition. This sheet explains things you need to do to take care of yourself at home. To help you stay as healthy as possible, you may be asked to change your diet, take certain medicines, and make other changes in your life. Your doctor may also give you more specific instructions. If you have problems or questions, call your doctor. What are the risks? Having heart failure makes it more likely for you to have some problems. These problems can get worse if you do not take good care of yourself. Problems may include:  Blood clotting problems. This may cause a stroke.  Damage to the kidneys, liver, or lungs.  Abnormal heart rhythms. Supplies needed:  Scale for weighing yourself.  Blood pressure monitor.  Notebook.  Medicines. How to care for yourself when you have heart failure Medicines Take over-the-counter and prescription medicines only as told by your doctor. Take your medicines every day.  Do not stop taking your medicine unless your doctor tells you to do so.  Do not skip any medicines.  Get your prescriptions refilled before you run out of medicine. This is important. Eating and drinking   Eat heart-healthy foods. Talk with a diet specialist (dietitian) to create an eating plan.  Choose foods that: ? Have no trans fat. ? Are low in saturated fat and cholesterol.  Choose healthy foods, such as: ? Fresh or frozen fruits and vegetables. ? Fish. ? Low-fat (lean) meats. ? Legumes, such as beans, peas, and lentils. ? Fat-free or low-fat dairy products. ? Whole-grain foods. ? High-fiber foods.  Limit salt (sodium) if told by your doctor. Ask your diet specialist to tell you which seasonings are healthy for your heart.  Cook in healthy ways instead of frying. Healthy ways of cooking include roasting, grilling, broiling, baking, poaching, steaming, and stir-frying.  Limit how much fluid you drink, if told by your  doctor. Alcohol use  Do not drink alcohol if: ? Your doctor tells you not to drink. ? Your heart was damaged by alcohol, or you have very bad heart failure. ? You are pregnant, may be pregnant, or are planning to become pregnant.  If you drink alcohol: ? Limit how much you use to:  0-1 drink a day for women.  0-2 drinks a day for men. ? Be aware of how much alcohol is in your drink. In the [place] one drink equals one 12 oz bottle of beer (355 mL), one 5 oz glass of wine (148 mL), or one 1 oz glass of hard liquor (44 mL). Lifestyle   Do not use any products that contain nicotine or tobacco, such as cigarettes, e-cigarettes, and chewing tobacco. If you need help quitting, ask your doctor. ? Do not use nicotine gum or patches before talking to your doctor.  Do not use illegal drugs.  Lose weight if told by your doctor.  Do physical activity if told by your doctor. Talk to your doctor before you begin an exercise if: ? You are an older adult. ? You have very bad heart failure.  Learn to manage stress. If you need help, ask your doctor.  Get rehab (rehabilitation) to help you stay independent and to help with your quality of life.  Plan time to rest when you get tired. Check weight and blood pressure   Weigh yourself every day. This will help you to know if fluid is building up in your body. ? Weigh yourself every morning   after you pee (urinate) and before you eat breakfast. ? Wear the same amount of clothing each time. ? Write down your daily weight. Give your record to your doctor.  Check and write down your blood pressure as told by your doctor.  Check your pulse as told by your doctor. Dealing with very hot and very cold weather  If it is very hot: ? Avoid activities that take a lot of energy. ? Use air conditioning or fans, or find a cooler place. ? Avoid caffeine and alcohol. ? Wear clothing that is loose-fitting, lightweight, and light-colored.  If it is very  cold: ? Avoid activities that take a lot of energy. ? Layer your clothes. ? Wear mittens or gloves, a hat, and a scarf when you go outside. ? Avoid alcohol. Follow these instructions at home:  Stay up to date with shots (vaccines). Get pneumococcal and flu (influenza) shots.  Keep all follow-up visits as told by your doctor. This is important. Contact a doctor if:  You gain weight quickly.  You have increasing shortness of breath.  You cannot do your normal activities.  You get tired easily.  You cough a lot.  You don't feel like eating or feel like you may vomit (nauseous).  You become puffy (swell) in your hands, feet, ankles, or belly (abdomen).  You cannot sleep well because it is hard to breathe.  You feel like your heart is beating fast (palpitations).  You get dizzy when you stand up. Get help right away if:  You have trouble breathing.  You or someone else notices a change in your behavior, such as having trouble staying awake.  You have chest pain or discomfort.  You pass out (faint). These symptoms may be an emergency. Do not wait to see if the symptoms will go away. Get medical help right away. Call your local emergency services (911 in the [place] Do not drive yourself to the hospital. Summary  Heart failure is a serious condition. To care for yourself, you may have to change your diet, take medicines, and make other lifestyle changes.  Take your medicines every day. Do not stop taking them unless your doctor tells you to do so.  Eat heart-healthy foods, such as fresh or frozen fruits and vegetables, fish, lean meats, legumes, fat-free or low-fat dairy products, and whole-grain or high-fiber foods.  Ask your doctor if you can drink alcohol. You may have to stop alcohol use if you have very bad heart failure.  Contact your doctor if you gain weight quickly or feel that your heart is beating too fast. Get help right away if you pass out, or have chest pain  or trouble breathing. This information is not intended to replace advice given to you by your health care provider. Make sure you discuss any questions you have with your health care provider. Document Revised: 09/15/2018 Document Reviewed: 09/16/2018 Elsevier Patient Education  2020 [employer]  

## 2019-09-18 NOTE — Consult Note (Signed)
Cardiology Consultation Note    Patient ID: Matthew Zamora, MRN: [id number], DOB/AGE: October 12, 1940 79 y.o. Admit date: 09/17/2019   Date of Consult: 09/18/2019 Primary Physician: Matthew Pink, MD Primary Cardiologist: Dr. Ubaldo Zamora  Chief Complaint: chf Reason for Consultation: Matthew Zamora Requesting MD: Dr. Clementeen Zamora  HPI: Matthew Zamora is a 79 y.o. 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 He was admitted to [facility] on 09/17/18 with acute on chronic respiratory failure, sepsis, and acute on chronic systolic heart failure. Was discharged and then presented to [facility] on 09/27/18 and underwent cardiac catheterization on 09/28/18 which revealed patent RCA stents with mild in-stent stenosis and severe ostial 80-90% stenosis of PDA branch, stable from previous cath. He remains on Plavix and Eliquis with moderate bruising, but no evidence of bleeding. He continues to live independently at home and is doing well with this. One of his son's lives next door and checks in on him daily. No evidence of recent ICD termination episodes. He was noted to have an AAA [place] on 12/31/18 revealed aortic aneurysm of 3.2cm - unchanged from previous exam. Cardiac catheterization on 09/28/18 revealed patent RCA stents with mild in-stent restenosis. There was severe ostial 80-90% stenosis of a medium caliber dual PDA branch which was jailed by previous stent. The appearance is stable compared to previous PCI result. There was diffuse ostial/proximal LCx stenosis (30-40%) with a 40-50% distal LCx/OM stenosis. The LAD had diffuse, mild-moderate calcification. He denies and symtpoms at present.  Patient was admitted after presenting to the emergency room with severe shortness of  breath.  He had a pulse ox of 70% on room air on arrival.  He is placed on nonrebreather.  He was aggressively diuresed with clinical improvement rapidly.  He is diuresed approximately 2-1/2 L since admission.  Echo was read as showing reduced LV function.  He appears to have ruled out for myocardial infarction with borderline troponin elevation secondary most likely to demand.  BNP yesterday was 518 down from 1466 in the past.  Patient feels markedly better and back to his baseline.  He admits to noncompliance with sodium intake as well as daily weights.  He is very anxious to go home.   Past Medical History:  Diagnosis Date  . Abdominal aortic aneurysm (AAA) ([place]) 05/13/15   seen on ct scan  . 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 Anemia   . 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 CAD (coronary artery disease)   . Cataract cortical, senile   . CHF (congestive heart failure) ([place])   . Chronic hoarseness   . Exocrine pancreatic insufficiency   . H. pylori infection   . History of hepatitis   . Hyperlipidemia   . Hypertension   . 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 PAF (paroxysmal atrial fibrillation) ([place])   . Prostate CA [facility])       Surgical History:  Past Surgical History:  Procedure Laterality Date  . CATARACT EXTRACTION    . COLONOSCOPY  10/06/2014, 09/18/2004, 03/14/2009  . ESOPHAGOGASTRODUODENOSCOPY  10/06/2014, 03/18/2001, 03/14/2009  . ESOPHAGOGASTRODUODENOSCOPY (EGD) WITH PROPOFOL N/A 05/07/2018   Procedure: ESOPHAGOGASTRODUODENOSCOPY (EGD) WITH PROPOFOL;  Surgeon: Toledo, [name], MD;  Location: [facility] ENDOSCOPY;  Service: Gastroenterology;  Laterality: N/A;  . FLEXIBLE SIGMOIDOSCOPY  08/26/1990  .  INSERTION OF ICD    . PROSTATE SURGERY    . TONSILLECTOMY       Home Meds: Prior to Admission medications   Medication Sig Start Date End Date Taking? Authorizing Provider  aspirin EC 81 MG tablet Take 81 mg by mouth daily.   Yes [provider]  atorvastatin  (LIPITOR) 40 MG tablet Take 40 mg by mouth daily.   Yes [provider]  clopidogrel (PLAVIX) 75 MG tablet Take 75 mg by mouth daily. 04/15/18  Yes [provider]  ELIQUIS 5 MG TABS tablet Take 5 mg by mouth every 12 (twelve) hours. 10/21/18  Yes [provider]  furosemide (LASIX) 40 MG tablet Take 40 mg by mouth daily.    Yes [provider]  gabapentin (NEURONTIN) 300 MG capsule TAKE 1 CAPSULE BY MOUTH NIGHTLY. MAY INCREASE TO 2 CAPSULES AT NIGHT AFTER ONE WEEK IF NO IMPROVEMENT. 12/17/18  Yes [provider]  lipase/protease/amylase (CREON) 12000 units CPEP capsule Take 24,000 Units by mouth 2 (two) times daily at 10 am and 4 pm.   Yes [provider]  lisinopril (PRINIVIL,ZESTRIL) 5 MG tablet Take 5 mg by mouth daily.   Yes [provider]  magnesium (MAGTAB) 84 MG (7MEQ) TBCR SR tablet Take 84 mg by mouth daily.   Yes [provider]  metoprolol succinate (TOPROL-XL) 100 MG 24 hr tablet Take 100 mg by mouth daily. 09/04/19  Yes [provider]  Multiple Vitamins-Minerals (MULTIVITAMIN WITH MINERALS) tablet Take 1 tablet by mouth daily.   Yes [provider]  omeprazole (PRILOSEC) 20 MG capsule Take 20 mg by mouth daily.   Yes [provider]  albuterol (VENTOLIN HFA) 108 (90 Base) MCG/ACT inhaler Inhale into the lungs as needed.  09/24/18   [provider]  Melatonin 1 MG TABS Take 1 tablet by mouth at bedtime as needed. 12/27/13   [provider]  metoprolol tartrate (LOPRESSOR) 100 MG tablet Take 1 tablet (100 mg total) by mouth 2 (two) times daily. Patient not taking: Reported on 09/17/2019 05/13/18   Max Sane, MD  tadalafil (CIALIS) 20 MG tablet TAKE 1 TABLET BY MOUTH ONCE DAILY AS NEEDED FOR ERECTILE DYSFUNCTION 07/13/18   [provider]    Inpatient Medications:  . apixaban  5 mg Oral Q12H  . clopidogrel  75 mg Oral Daily  . furosemide  40 mg Intravenous Q12H  .  influenza vaccine adjuvanted  0.5 mL Intramuscular Tomorrow-1000  . lisinopril  5 mg Oral Daily  . melatonin  10 mg Oral QHS  . metoprolol tartrate  100 mg Oral BID  . sodium chloride flush  3 mL Intravenous Q12H   . sodium chloride      Allergies: No Known Allergies  Social History   Socioeconomic History  . Marital status: Single    Spouse name: Not on file  . Number of children: Not on file  . Years of education: Not on file  . Highest education level: Not on file  Occupational History  . Not on file  Tobacco Use  . Smoking status: Former Smoker    Packs/day: 1.00    Years: 38.00    Pack years: 38.00    Types: Cigarettes    Quit date: 06/18/1999    Years since quitting: 20.2  . Smokeless tobacco: Never Used  Substance and Sexual Activity  . Alcohol use: No    Alcohol/week: 0.0 standard drinks  . Drug use: Not on file  .  Sexual activity: Not on file  Other Topics Concern  . Not on file  Social History Narrative  . Not on file   Social Determinants of Health   Financial Resource Strain:   . Difficulty of Paying Living Expenses:   Food Insecurity:   . Worried About Charity fundraiser in the Last Year:   . Arboriculturist in the Last Year:   Transportation Needs:   . Film/video editor (Medical):   [name] Lack of Transportation (Non-Medical):   Physical Activity:   . Days of Exercise per Week:   . Minutes of Exercise per Session:   Stress:   . Feeling of Stress :   Social Connections:   . Frequency of Communication with Friends and Family:   . Frequency of Social Gatherings with Friends and Family:   . Attends Religious Services:   . Active Member of Clubs or Organizations:   . Attends Archivist Meetings:   [name] Marital Status:   Intimate Partner Violence:   . Fear of Current or Ex-Partner:   . Emotionally Abused:   [name] Physically Abused:   . Sexually Abused:      Family History  Problem Relation Age of Onset  . Heart attack Mother   . Heart  attack Father      Review of Systems: A 12-system review of systems was performed and is negative except as noted in the HPI.  Labs: No results for input(s): CKTOTAL, CKMB, TROPONINI in the last 72 hours. Lab Results  Component Value Date   WBC 9.5 09/17/2019   HGB 11.5 (L) 09/17/2019   HCT 35.3 (L) 09/17/2019   MCV 94.6 09/17/2019   PLT 175 09/17/2019    Recent Labs  Lab 09/17/19 0235 09/17/19 0235 09/18/19 0419  NA 139   < > 137  K 4.2   < > 4.0  CL 101   < > 100  CO2 29   < > 32  BUN 30*   < > 25*  CREATININE 1.86*   < > 1.48*  CALCIUM 9.3   < > 9.2  PROT 7.2  --   --   BILITOT 0.7  --   --   ALKPHOS 60  --   --   ALT 16  --   --   AST 22  --   --   GLUCOSE 132*   < > 101*   < > = values in this interval not displayed.   Lab Results  Component Value Date   TRIG 130 04/30/2018   No results found for: DDIMER  Radiology/Studies:  [facility] Chest Port 1 View  Result Date: 09/17/2019 CLINICAL DATA:  Shortness of breath EXAM: PORTABLE CHEST 1 VIEW COMPARISON:  09/18/2018 FINDINGS: Cardiac shadow is stable. Defibrillator is again noted and stable. Increased vascular congestion is seen. Mild interstitial edema is noted as well. No sizable effusion is seen. No bony abnormality is noted. IMPRESSION: Changes consistent with congestive failure. Electronically Signed   By: Inez Catalina M.D.   On: 09/17/2019 02:40   ECHOCARDIOGRAM COMPLETE  Result Date: 09/18/2019    ECHOCARDIOGRAM REPORT   Patient Name:   Matthew Zamora Date of Exam: 09/17/2019 Medical Rec #:  [id number]           Height:       30.0 in Accession #:    [id number]          Weight:       160.1  lb Date of Birth:  June 30, 1940           BSA:          1.027 m Patient Age:    4 years            BP:           135/70 mmHg Patient Gender: M                   HR:           73 bpm. Exam Location:  [facility] Procedure: 2D Echo, Cardiac Doppler and Color Doppler Indications:     CHF-Acute Systolic 638.75 / I43.32  History:          Patient has prior history of Echocardiogram examinations. CHF,                  CAD; Risk Factors:Hypertension.  Sonographer:     Alyse Low Roar Referring Phys:  [id number] Athena Masse Diagnosing Phys: Neoma Laming MD IMPRESSIONS  1. Left ventricular ejection fraction, by estimation, is 25 to 30%. The left ventricle has severely decreased function. The left ventricle demonstrates global hypokinesis. The left ventricular internal cavity size was severely dilated. There is mild concentric left ventricular hypertrophy. Left ventricular diastolic parameters are consistent with Grade II diastolic dysfunction (pseudonormalization).  2. Right ventricular systolic function is moderately reduced. The right ventricular size is severely enlarged. There is mildly elevated pulmonary artery systolic pressure.  3. Left atrial size was severely dilated.  4. Right atrial size was severely dilated.  5. The mitral valve is myxomatous. Mild mitral valve regurgitation. No evidence of mitral stenosis.  6. The aortic valve is grossly normal. Aortic valve regurgitation is trivial. Mild aortic valve sclerosis is present, with no evidence of aortic valve stenosis. FINDINGS  Left Ventricle: Left ventricular ejection fraction, by estimation, is 25 to 30%. The left ventricle has severely decreased function. The left ventricle demonstrates global hypokinesis. The left ventricular internal cavity size was severely dilated. There is mild concentric left ventricular hypertrophy. Left ventricular diastolic parameters are consistent with Grade II diastolic dysfunction (pseudonormalization). Right Ventricle: The right ventricular size is severely enlarged. No increase in right ventricular wall thickness. Right ventricular systolic function is moderately reduced. There is mildly elevated pulmonary artery systolic pressure. The tricuspid regurgitant velocity is 2.32 m/s, and with an assumed right atrial pressure of 10 mmHg, the estimated right ventricular  systolic pressure is 66.0 mmHg. Left Atrium: Left atrial size was severely dilated. Right Atrium: Right atrial size was severely dilated. Pericardium: There is no evidence of pericardial effusion. Mitral Valve: The mitral valve is myxomatous. Mild mitral valve regurgitation. No evidence of mitral valve stenosis. Tricuspid Valve: The tricuspid valve is normal in structure. Tricuspid valve regurgitation is mild. Aortic Valve: The aortic valve is grossly normal. Aortic valve regurgitation is trivial. Mild aortic valve sclerosis is present, with no evidence of aortic valve stenosis. Aortic valve mean gradient measures 2.0 mmHg. Aortic valve peak gradient measures 4.4 mmHg. Aortic valve area, by VTI measures 1.89 cm. Pulmonic Valve: The pulmonic valve was normal in structure. Pulmonic valve regurgitation is trivial. Aorta: The aortic root, ascending aorta and aortic arch are all structurally normal, with no evidence of dilitation or obstruction. IAS/Shunts: The interatrial septum was not well visualized.  LEFT VENTRICLE PLAX 2D LVIDd:         5.96 cm  Diastology LVIDs:         5.23 cm  LV e'  lateral:   7.18 cm/s LV PW:         1.07 cm  LV E/e' lateral: 15.7 LV IVS:        1.10 cm  LV e' medial:    8.27 cm/s LVOT diam:     2.00 cm  LV E/e' medial:  13.7 LV SV:         38 LV SV Index:   37 LVOT Area:     3.14 cm  RIGHT VENTRICLE RV Mid diam:    3.29 cm RV S prime:     9.46 cm/s LEFT ATRIUM             Index       RIGHT ATRIUM           Index LA diam:        4.30 cm 4.19 cm/m  RA Area:     15.80 cm LA Vol (A2C):   89.1 ml 86.74 ml/m RA Volume:   40.10 ml  39.04 ml/m LA Vol (A4C):   79.9 ml 77.78 ml/m LA Biplane Vol: 87.2 ml 84.89 ml/m  AORTIC VALVE                   PULMONIC VALVE AV Area (Vmax):    1.77 cm    PV Vmax:        0.75 m/s AV Area (Vmean):   1.89 cm    PV Peak grad:   2.2 mmHg AV Area (VTI):     1.89 cm    RVOT Peak grad: 1 mmHg AV Vmax:           105.00 cm/s AV Vmean:          66.900 cm/s AV VTI:             0.203 m AV Peak Grad:      4.4 mmHg AV Mean Grad:      2.0 mmHg LVOT Vmax:         59.10 cm/s LVOT Vmean:        40.300 cm/s LVOT VTI:          0.122 m LVOT/AV VTI ratio: 0.60  AORTA Ao Root diam: 2.70 cm MITRAL VALVE                TRICUSPID VALVE MV Area (PHT): 4.17 cm     TR Peak grad:   21.5 mmHg MV Decel Time: 182 msec     TR Vmax:        232.00 cm/s MV E velocity: 113.00 cm/s MV A velocity: 38.80 cm/s   SHUNTS MV E/A ratio:  2.91         Systemic VTI:  0.12 m                             Systemic Diam: 2.00 cm Neoma Laming MD Electronically signed by Neoma Laming MD Signature Date/Time: 09/18/2019/8:36:36 AM    Final     Wt Readings from Last 3 Encounters:  09/18/19 80.6 kg  12/31/18 72.6 kg  09/17/18 80 kg    EKG: Sinus rhythm with nonspecific ST-T wave changes.  Physical Exam:  Blood pressure 112/78, pulse 65, temperature 98 F (36.7 C), temperature source Oral, resp. rate 18, height 2\' 6"  (0.762 m), weight 80.6 kg, SpO2 97 %. Body mass index is 138.9 kg/m. General: Well developed, well nourished, in no acute distress. Head: Normocephalic, atraumatic, sclera non-icteric, no  xanthomas, nares are without discharge.  Neck: Negative for carotid bruits. JVD not elevated. Lungs: Clear bilaterally to auscultation no rales or rhonchi. Heart: RRR with S1 S2. No murmurs, rubs, or gallops appreciated. Abdomen: Soft, non-tender, non-distended with normoactive bowel sounds. No hepatomegaly. No rebound/guarding. No obvious abdominal masses. Msk:  Strength and tone appear normal for age. Extremities: No clubbing or cyanosis. No edema.  Distal pedal pulses are 2+ and equal bilaterally. Neuro: Alert and oriented X 3. No facial asymmetry. No focal deficit. Moves all extremities spontaneously. Psych:  Responds to questions appropriately with a normal affect.     Assessment and Plan  79 y.o. male with   1. Chronic combined systolic and diastolic heart failure (CMS-[name]) -Most recent echo  revealed ef of 55-60%.  Repeat echo during this admission showed a significantly decreased LV function.  This may be secondary to acute setting.  Clinically has markedly improved.  Will convert back to p.o. Lasix and continue with metoprolol tartrate 100 mg twice daily, furosemide 40 mg daily, Plavix 75 mg daily, Eliquis 5 mg daily, low-sodium diet and daily weights.  Will discuss Entresto or further more aggressive therapy as an outpatient.  And low sodium diet. Daily weights, adjusting diuretic therapy based on weight. I50.42  2. Essential hypertension -continue with current regimen with lisinopril and metoprolol. BP goal of 130/80. I10  3. AAA (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 (CMS-[name]) -continue with bp control and follow. I71.4  4. Ventricular tachycardia (CMS-[name]) -has aicd in place and on metoprolol. AICD being followed by [facility] ep. I47.2  5. Paroxysmal atrial fibrillation (CMS-[name]) continue with metoprolol and anticoagulate with eliquis. I48.0  6. Ischemic cardiomyopathy I25.5 EF has reduced significantly.  No acute coronary event appears to have occurred.  Hemodynamically and clinically stable without evidence of heart failure.  Will evaluate BNP repeat and if improved or not simply elevated would consider discharge with outpatient follow-up early next week.  Low-sodium diet and daily weights. 7. Coronary artery disease involving native coronary artery of native heart without angina pectoris -stable at present. I25.10 appears to have ruled out for myocardial infarction.  Continue with Plavix and beta-blocker as listed above.  Will attempt to add a statin.   Signed, Teodoro Spray MD 09/18/2019, 10:20 AM Pager: [phone]

## 2019-09-18 NOTE — Discharge Summary (Signed)
Physician Discharge Summary  Matthew Zamora IRC:[id number] DOB: 11-Apr-1941 DOA: 09/17/2019  PCP: Maryland Pink, MD  Admit date: 09/17/2019 Discharge date: 09/18/2019  Admitted From: Home Disposition: Home Recommendations for Outpatient Follow-up:  Follow up with cardiology in 2 weeks.  Home Health: None Equipment/Devices: None  Discharge Condition: Fair CODE STATUS: Full code Diet recommendation: Heart Healthy with 2 g sodium    Discharge Diagnoses:   Principal problem   Acute on chronic combined systolic and diastolic CHF (congestive heart failure) ([name])   Active Problems:   Acute on chronic respiratory failure with hypoxia ([name])   Paroxysmal atrial fibrillation ([name])   CAD (coronary artery disease)   Ischemic cardiomyopathy   CKD (chronic kidney disease) stage 3, GFR 30-59 ml/min  Brief narrative/HPI 79 year old male with CAD, ischemic cardiomyopathy with last EF of 40-45%, chronic respiratory failure on home O2 only as needed, history of V. tach s/p ICD, chronic kidney disease stage IIIa, paroxysmal A. fib on Eliquis and hypertension presented with acute onset of respiratory distress.  Patient reports that he was feeling increasingly dyspneic for past few days. He was found to be tachypneic with O2 sat of 70% on room air per EMS and placed on nonrebreather and then to BiPAP. Chest x-ray with findings of acute CHF. Patient was able to be transitioned to nasal cannula in the ED after IV Lasix given and admitted to hospital service.  Principal problem Acute on chronic combined systolic and diastolic CHF.  ([place]) Acute on chronic respiratory failure with hypoxia ([name]) No clear triggering symptoms.  Denies any recent illness or chest pain. Patient reports taking his meds as instructed but has not been weighing himself lately.  Patient stable on 2 L via nasal cannula, wean to room air. Had elevated troponin peaked at 42 with stable EKG and no chest discomfort.  Suspect this  is due to demand ischemia. Diuresed quite well on IV Lasix 40 mg every 12 hours and feels his breathing back to normal. 2D echo shows markedly reduced EF of 25 and 30% with global hypokinesis (per cardiology his EF had improved to 55% recently).  Given clinical improvement with IV Lasix cardiology recommends no further inpatient work-up. Continue daily Lasix (40 mg), metoprolol tartrate 100 mg twice daily, Lipitor 40 mg daily, lisinopril 5 mg daily.  Appreciate cardiology recommendations.  Plan on outpatient follow-up and discuss on starting Entresto. Home health heart failure instructions including diet and medication adherence, monitoring daily weight and outpatient follow-up given.  Active problems Paroxysmal A. fib Rate controlled.  Continue metoprolol and Eliquis.  Coronary artery disease No chest pain symptoms.    Continue Plavix, metoprolol and statin.  Patient was also taking baby aspirin at home.  Since patient already on Plavix and Eliquis will discontinue aspirin.   Chronic kidney disease stage IIIa Renal function stable at baseline.   Family Communication  : None at bedside  Disposition Plan  : Home   Consults  :  Cardiology (Dr. Ubaldo Glassing)  Procedures  : 2D echo   Discharge Instructions   Allergies as of 09/18/2019   No Known Allergies     Medication List    STOP taking these medications   aspirin EC 81 MG tablet        TAKE these medications   albuterol 108 (90 Base) MCG/ACT inhaler Commonly known as: VENTOLIN HFA Inhale into the lungs as needed.   atorvastatin 40 MG tablet Commonly known as: LIPITOR Take 40 mg by mouth daily.   clopidogrel  75 MG tablet Commonly known as: PLAVIX Take 75 mg by mouth daily.   Eliquis 5 MG Tabs tablet Generic drug: apixaban Take 5 mg by mouth every 12 (twelve) hours.   furosemide 40 MG tablet Commonly known as: LASIX Take 40 mg by mouth daily.   gabapentin 300 MG capsule Commonly known as: NEURONTIN TAKE  1 CAPSULE BY MOUTH NIGHTLY. MAY INCREASE TO 2 CAPSULES AT NIGHT AFTER ONE WEEK IF NO IMPROVEMENT.   lipase/protease/amylase 12000 units Cpep capsule Commonly known as: CREON Take 24,000 Units by mouth 2 (two) times daily at 10 am and 4 pm.   lisinopril 5 MG tablet Commonly known as: ZESTRIL Take 5 mg by mouth daily.   magnesium 84 MG (7MEQ) Tbcr SR tablet Commonly known as: MAGTAB Take 84 mg by mouth daily.   melatonin 1 MG Tabs tablet Take 1 tablet by mouth at bedtime as needed.   metoprolol tartrate 100 mg Commonly known as:  Take 1 tablet (100 mg total) by mouth in the morning and at bedtime.   multivitamin with minerals tablet Take 1 tablet by mouth daily.   omeprazole 20 MG capsule Commonly known as: PRILOSEC Take 20 mg by mouth daily.   tadalafil 20 MG tablet Commonly known as: CIALIS TAKE 1 TABLET BY MOUTH ONCE DAILY AS NEEDED FOR ERECTILE DYSFUNCTION      Follow-up Information    [facility] Follow up on 09/29/2019.   Specialty: Cardiology Why: at 9:30am. Enter through the [place] entrance Contact information: [address] [place]         No Known Allergies      Procedures/Studies: [facility] Chest Port 1 View  Result Date: 09/17/2019 CLINICAL DATA:  Shortness of breath EXAM: PORTABLE CHEST 1 VIEW COMPARISON:  09/18/2018 FINDINGS: Cardiac shadow is stable. Defibrillator is again noted and stable. Increased vascular congestion is seen. Mild interstitial edema is noted as well. No sizable effusion is seen. No bony abnormality is noted. IMPRESSION: Changes consistent with congestive failure. Electronically Signed   By: Inez Catalina M.D.   On: 09/17/2019 02:40   ECHOCARDIOGRAM COMPLETE  Result Date: 09/18/2019    ECHOCARDIOGRAM REPORT   Patient Name:   Matthew Zamora Date of Exam: 09/17/2019 Medical Rec #:  [id number]           Height:       30.0 in Accession  #:    [id number]          Weight:       160.1 lb Date of Birth:  [birth year]           BSA:          1.027 m Patient Age:    79 years            BP:           135/70 mmHg Patient Gender: M                   HR:           73 bpm. Exam Location:  [facility] Procedure: 2D Echo, Cardiac Doppler and Color Doppler Indications:     CHF-Acute Systolic 175.10 / C58.52  History:         Patient has prior history of Echocardiogram examinations. CHF,                  CAD; Risk Factors:Hypertension.  Sonographer:  Alyse Low Roar Referring Phys:  [id number] Athena Masse Diagnosing Phys: Neoma Laming MD IMPRESSIONS  1. Left ventricular ejection fraction, by estimation, is 25 to 30%. The left ventricle has severely decreased function. The left ventricle demonstrates global hypokinesis. The left ventricular internal cavity size was severely dilated. There is mild concentric left ventricular hypertrophy. Left ventricular diastolic parameters are consistent with Grade II diastolic dysfunction (pseudonormalization).  2. Right ventricular systolic function is moderately reduced. The right ventricular size is severely enlarged. There is mildly elevated pulmonary artery systolic pressure.  3. Left atrial size was severely dilated.  4. Right atrial size was severely dilated.  5. The mitral valve is myxomatous. Mild mitral valve regurgitation. No evidence of mitral stenosis.  6. The aortic valve is grossly normal. Aortic valve regurgitation is trivial. Mild aortic valve sclerosis is present, with no evidence of aortic valve stenosis. FINDINGS  Left Ventricle: Left ventricular ejection fraction, by estimation, is 25 to 30%. The left ventricle has severely decreased function. The left ventricle demonstrates global hypokinesis. The left ventricular internal cavity size was severely dilated. There is mild concentric left ventricular hypertrophy. Left ventricular diastolic parameters are consistent with Grade II diastolic dysfunction  (pseudonormalization). Right Ventricle: The right ventricular size is severely enlarged. No increase in right ventricular wall thickness. Right ventricular systolic function is moderately reduced. There is mildly elevated pulmonary artery systolic pressure. The tricuspid regurgitant velocity is 2.32 m/s, and with an assumed right atrial pressure of 10 mmHg, the estimated right ventricular systolic pressure is 10.1 mmHg. Left Atrium: Left atrial size was severely dilated. Right Atrium: Right atrial size was severely dilated. Pericardium: There is no evidence of pericardial effusion. Mitral Valve: The mitral valve is myxomatous. Mild mitral valve regurgitation. No evidence of mitral valve stenosis. Tricuspid Valve: The tricuspid valve is normal in structure. Tricuspid valve regurgitation is mild. Aortic Valve: The aortic valve is grossly normal. Aortic valve regurgitation is trivial. Mild aortic valve sclerosis is present, with no evidence of aortic valve stenosis. Aortic valve mean gradient measures 2.0 mmHg. Aortic valve peak gradient measures 4.4 mmHg. Aortic valve area, by VTI measures 1.89 cm. Pulmonic Valve: The pulmonic valve was normal in structure. Pulmonic valve regurgitation is trivial. Aorta: The aortic root, ascending aorta and aortic arch are all structurally normal, with no evidence of dilitation or obstruction. IAS/Shunts: The interatrial septum was not well visualized.  LEFT VENTRICLE PLAX 2D LVIDd:         5.96 cm  Diastology LVIDs:         5.23 cm  LV e' lateral:   7.18 cm/s LV PW:         1.07 cm  LV E/e' lateral: 15.7 LV IVS:        1.10 cm  LV e' medial:    8.27 cm/s LVOT diam:     2.00 cm  LV E/e' medial:  13.7 LV SV:         38 LV SV Index:   37 LVOT Area:     3.14 cm  RIGHT VENTRICLE RV Mid diam:    3.29 cm RV S prime:     9.46 cm/s LEFT ATRIUM             Index       RIGHT ATRIUM           Index LA diam:        4.30 cm 4.19 cm/m  RA Area:     15.80 cm LA Vol (A2C):  89.1 ml 86.74 ml/m  RA Volume:   40.10 ml  39.04 ml/m LA Vol (A4C):   79.9 ml 77.78 ml/m LA Biplane Vol: 87.2 ml 84.89 ml/m  AORTIC VALVE                   PULMONIC VALVE AV Area (Vmax):    1.77 cm    PV Vmax:        0.75 m/s AV Area (Vmean):   1.89 cm    PV Peak grad:   2.2 mmHg AV Area (VTI):     1.89 cm    RVOT Peak grad: 1 mmHg AV Vmax:           105.00 cm/s AV Vmean:          66.900 cm/s AV VTI:            0.203 m AV Peak Grad:      4.4 mmHg AV Mean Grad:      2.0 mmHg LVOT Vmax:         59.10 cm/s LVOT Vmean:        40.300 cm/s LVOT VTI:          0.122 m LVOT/AV VTI ratio: 0.60  AORTA Ao Root diam: 2.70 cm MITRAL VALVE                TRICUSPID VALVE MV Area (PHT): 4.17 cm     TR Peak grad:   21.5 mmHg MV Decel Time: 182 msec     TR Vmax:        232.00 cm/s MV E velocity: 113.00 cm/s MV A velocity: 38.80 cm/s   SHUNTS MV E/A ratio:  2.91         Systemic VTI:  0.12 m                             Systemic Diam: 2.00 cm Neoma Laming MD Electronically signed by Neoma Laming MD Signature Date/Time: 09/18/2019/8:36:36 AM    Final        Subjective: Reports his breathing to be much better and at baseline.  Discharge Exam: Vitals:   09/17/19 1939 09/18/19 0442  BP: 117/79 112/78  Pulse: 63 65  Resp: 18 18  Temp: 97.7 F (36.5 C) 98 F (36.7 C)  SpO2: 97% 97%   Vitals:   09/17/19 1920 09/17/19 1939 09/18/19 0442 09/18/19 0500  BP: 126/68 117/79 112/78   Pulse: (!) 59 63 65   Resp: 14 18 18    Temp: 97.7 F (36.5 C) 97.7 F (36.5 C) 98 F (36.7 C)   TempSrc: Oral Oral Oral   SpO2: 100% 97% 97%   Weight:    80.6 kg  Height:        General: Elderly male not in distress HEENT: Moist mucosa, supple neck Chest: Minimal bibasilar crackles (improved from yesterday), no rhonchi or wheeze CVs: Normal S1-S2, no murmurs GI: Soft, nondistended, nontender Musculoskeletal: Warm, resolved leg edema    The results of significant diagnostics from this hospitalization (including imaging, microbiology, ancillary  and laboratory) are listed below for reference.     Microbiology: Recent Results (from the past 240 hour(s))  Respiratory Panel by RT PCR (Flu A&B, Covid) - Nasopharyngeal Swab     Status: None   Collection Time: 09/17/19  2:35 AM   Specimen: Nasopharyngeal Swab  Result Value Ref Range Status   SARS Coronavirus 2 by RT PCR NEGATIVE NEGATIVE  Final    Comment: (NOTE) SARS-CoV-2 target nucleic acids are NOT DETECTED. The SARS-CoV-2 RNA is generally detectable in upper respiratoy specimens during the acute phase of infection. The lowest concentration of SARS-CoV-2 viral copies this assay can detect is 131 copies/mL. A negative result does not preclude SARS-Cov-2 infection and should not be used as the sole basis for treatment or other patient management decisions. A negative result may occur with  improper specimen collection/handling, submission of specimen other than nasopharyngeal swab, presence of viral mutation(s) within the areas targeted by this assay, and inadequate number of viral copies (<131 copies/mL). A negative result must be combined with clinical observations, patient history, and epidemiological information. The expected result is Negative. Fact Sheet for Patients:  PinkCheek.be Fact Sheet for Healthcare Providers:  GravelBags.it This test is not yet ap proved or cleared by the [place] FDA and  has been authorized for detection and/or diagnosis of SARS-CoV-2 by FDA under an Emergency Use Authorization (EUA). This EUA will remain  in effect (meaning this test can be used) for the duration of the COVID-19 declaration under Section 564(b)(1) of the Act, 21 U.S.C. section 360bbb-3(b)(1), unless the authorization is terminated or revoked sooner.    Influenza A by PCR NEGATIVE NEGATIVE Final   Influenza B by PCR NEGATIVE NEGATIVE Final    Comment: (NOTE) The Xpert Xpress SARS-CoV-2/FLU/RSV assay is intended  as an aid in  the diagnosis of influenza from Nasopharyngeal swab specimens and  should not be used as a sole basis for treatment. Nasal washings and  aspirates are unacceptable for Xpert Xpress SARS-CoV-2/FLU/RSV  testing. Fact Sheet for Patients: PinkCheek.be Fact Sheet for Healthcare Providers: GravelBags.it This test is not yet approved or cleared by the [place] FDA and  has been authorized for detection and/or diagnosis of SARS-CoV-2 by  FDA under an Emergency Use Authorization (EUA). This EUA will remain  in effect (meaning this test can be used) for the duration of the  Covid-19 declaration under Section 564(b)(1) of the Act, 21  U.S.C. section 360bbb-3(b)(1), unless the authorization is  terminated or revoked. Performed at [facility], [address] [place], [place] [address]      Labs: BNP (last 3 results) Recent Labs    09/17/19 0235 09/18/19 0419  BNP 518.0* 220.2*   Basic Metabolic Panel: Recent Labs  Lab 09/17/19 0235 09/18/19 0419  NA 139 137  K 4.2 4.0  CL 101 100  CO2 29 32  GLUCOSE 132* 101*  BUN 30* 25*  CREATININE 1.86* 1.48*  CALCIUM 9.3 9.2   Liver Function Tests: Recent Labs  Lab 09/17/19 0235  AST 22  ALT 16  ALKPHOS 60  BILITOT 0.7  PROT 7.2  ALBUMIN 4.4   No results for input(s): LIPASE, AMYLASE in the last 168 hours. No results for input(s): AMMONIA in the last 168 hours. CBC: Recent Labs  Lab 09/17/19 0235  WBC 9.5  NEUTROABS 7.9*  HGB 11.5*  HCT 35.3*  MCV 94.6  PLT 175   Cardiac Enzymes: No results for input(s): CKTOTAL, CKMB, CKMBINDEX, TROPONINI in the last 168 hours. BNP: Invalid input(s): POCBNP CBG: No results for input(s): GLUCAP in the last 168 hours. D-Dimer No results for input(s): DDIMER in the last 72 hours. Hgb A1c No results for input(s): HGBA1C in the last 72 hours. Lipid Profile No results for input(s): CHOL, HDL,  LDLCALC, TRIG, CHOLHDL, LDLDIRECT in the last 72 hours. Thyroid function studies No results for input(s): TSH, T4TOTAL, T3FREE,  THYROIDAB in the last 72 hours.  Invalid input(s): FREET3 Anemia work up No results for input(s): VITAMINB12, FOLATE, FERRITIN, TIBC, IRON, RETICCTPCT in the last 72 hours. Urinalysis    Component Value Date/Time   COLORURINE YELLOW (A) 09/17/2018 0833   APPEARANCEUR HAZY (A) 09/17/2018 0833   LABSPEC 1.020 09/17/2018 0833   PHURINE 5.0 09/17/2018 0833   GLUCOSEU NEGATIVE 09/17/2018 0833   HGBUR NEGATIVE 09/17/2018 0833   BILIRUBINUR NEGATIVE 09/17/2018 0833   KETONESUR NEGATIVE 09/17/2018 0833   PROTEINUR NEGATIVE 09/17/2018 0833   NITRITE NEGATIVE 09/17/2018 0833   LEUKOCYTESUR SMALL (A) 09/17/2018 0833   Sepsis Labs Invalid input(s): PROCALCITONIN,  WBC,  LACTICIDVEN Microbiology Recent Results (from the past 240 hour(s))  Respiratory Panel by RT PCR (Flu A&B, Covid) - Nasopharyngeal Swab     Status: None   Collection Time: 09/17/19  2:35 AM   Specimen: Nasopharyngeal Swab  Result Value Ref Range Status   SARS Coronavirus 2 by RT PCR NEGATIVE NEGATIVE Final    Comment: (NOTE) SARS-CoV-2 target nucleic acids are NOT DETECTED. The SARS-CoV-2 RNA is generally detectable in upper respiratoy specimens during the acute phase of infection. The lowest concentration of SARS-CoV-2 viral copies this assay can detect is 131 copies/mL. A negative result does not preclude SARS-Cov-2 infection and should not be used as the sole basis for treatment or other patient management decisions. A negative result may occur with  improper specimen collection/handling, submission of specimen other than nasopharyngeal swab, presence of viral mutation(s) within the areas targeted by this assay, and inadequate number of viral copies (<131 copies/mL). A negative result must be combined with clinical observations, patient history, and epidemiological information. The expected  result is Negative. Fact Sheet for Patients:  PinkCheek.be Fact Sheet for Healthcare Providers:  GravelBags.it This test is not yet ap proved or cleared by the [place] FDA and  has been authorized for detection and/or diagnosis of SARS-CoV-2 by FDA under an Emergency Use Authorization (EUA). This EUA will remain  in effect (meaning this test can be used) for the duration of the COVID-19 declaration under Section 564(b)(1) of the Act, 21 U.S.C. section 360bbb-3(b)(1), unless the authorization is terminated or revoked sooner.    Influenza A by PCR NEGATIVE NEGATIVE Final   Influenza B by PCR NEGATIVE NEGATIVE Final    Comment: (NOTE) The Xpert Xpress SARS-CoV-2/FLU/RSV assay is intended as an aid in  the diagnosis of influenza from Nasopharyngeal swab specimens and  should not be used as a sole basis for treatment. Nasal washings and  aspirates are unacceptable for Xpert Xpress SARS-CoV-2/FLU/RSV  testing. Fact Sheet for Patients: PinkCheek.be Fact Sheet for Healthcare Providers: GravelBags.it This test is not yet approved or cleared by the [place] FDA and  has been authorized for detection and/or diagnosis of SARS-CoV-2 by  FDA under an Emergency Use Authorization (EUA). This EUA will remain  in effect (meaning this test can be used) for the duration of the  Covid-19 declaration under Section 564(b)(1) of the Act, 21  U.S.C. section 360bbb-3(b)(1), unless the authorization is  terminated or revoked. Performed at [facility], [address], [place], [place] [address]      Time coordinating discharge: 35 minutes  SIGNED:   Louellen Molder, MD  [facility] 09/18/2019, 11:51 AM Pager   If 7PM-7AM, please contact night-coverage [email or web address] Password [email or web address]

## 2019-09-18 NOTE — Progress Notes (Signed)
Matthew Zamora to be D/C'd home with son per MD order.  Discussed prescriptions and follow up appointments with the patient. Prescriptions given to patient, medication list explained in detail. Pt verbalized understanding.  Allergies as of 09/18/2019   No Known Allergies      Medication List     STOP taking these medications    aspirin EC 81 MG tablet   metoprolol succinate 100 MG 24 hr tablet Commonly known as: TOPROL-XL       TAKE these medications    albuterol 108 (90 Base) MCG/ACT inhaler Commonly known as: VENTOLIN HFA Inhale into the lungs as needed.   atorvastatin 40 MG tablet Commonly known as: LIPITOR Take 40 mg by mouth daily.   clopidogrel 75 MG tablet Commonly known as: PLAVIX Take 75 mg by mouth daily.   Eliquis 5 MG Tabs tablet Generic drug: apixaban Take 5 mg by mouth every 12 (twelve) hours.   furosemide 40 MG tablet Commonly known as: LASIX Take 40 mg by mouth daily.   gabapentin 300 MG capsule Commonly known as: NEURONTIN TAKE 1 CAPSULE BY MOUTH NIGHTLY. MAY INCREASE TO 2 CAPSULES AT NIGHT AFTER ONE WEEK IF NO IMPROVEMENT.   lipase/protease/amylase 12000 units Cpep capsule Commonly known as: CREON Take 24,000 Units by mouth 2 (two) times daily at 10 am and 4 pm.   lisinopril 5 MG tablet Commonly known as: ZESTRIL Take 5 mg by mouth daily.   magnesium 84 MG (7MEQ) Tbcr SR tablet Commonly known as: MAGTAB Take 84 mg by mouth daily.   melatonin 1 MG Tabs tablet Take 1 tablet by mouth at bedtime as needed.   metoprolol tartrate 100 MG tablet Commonly known as: LOPRESSOR Take 1 tablet (100 mg total) by mouth 2 (two) times daily.   multivitamin with minerals tablet Take 1 tablet by mouth daily.   omeprazole 20 MG capsule Commonly known as: PRILOSEC Take 20 mg by mouth daily.   tadalafil 20 MG tablet Commonly known as: CIALIS TAKE 1 TABLET BY MOUTH ONCE DAILY AS NEEDED FOR ERECTILE DYSFUNCTION        Vitals:   09/18/19  0442 09/18/19 1220  BP: 112/78 129/84  Pulse: 65 70  Resp: 18 15  Temp: 98 F (36.7 C) 98 F (36.7 C)  SpO2: 97% 95%    Skin clean, dry and intact without evidence of skin break down, no evidence of skin tears noted. IV catheter discontinued intact. Site without signs and symptoms of complications. Dressing and pressure applied. Pt denies pain at this time. No complaints noted.  An After Visit Summary was printed and given to the patient. Patient escorted via [place], and D/C home via private auto.  [place]

## 2019-09-24 ENCOUNTER — Telehealth: Payer: Self-pay | Admitting: Family

## 2019-09-24 NOTE — Telephone Encounter (Signed)
LVM regarding patients new patient CHF Clinic appointment that was scheduled when he was discharged from hospital for 4/13. Asked patient to give [place] a call to confirm.   Alyse Low, [place]

## 2019-09-28 NOTE — Progress Notes (Signed)
Patient ID: Matthew Zamora, male    DOB: 19-Jan-1941, 79 y.o.   MRN: [id number]  HPI  Matthew Zamora is a 79 y/o male with a history of prostate cancer, CAD, hyperlipidemia, HTN, anemia, paroxysmal atrial fibrillation, previous tobacco use and chronic heart failure.   Echo report from 09/17/19 reviewed and showed an EF of 25-30% along with mild Matthew and mildly elevated PA pressure.   Admitted 09/17/19 due to acute on chronic HF. Cardiology consult obtained. Initially needed IV lasix and then transitioned to oral diuretics. Placed initially on bipap and then transitioned to nasal cannula. Elevated troponin thought to be due to demand ischemia. Discharged the following day.   He presents today for his initial visit with a chief complaint of minimal shortness of breath upon moderate exertion. He describes this as chronic in nature having been present for several years. He has associated light-headedness and easy bruising along with this. He denies any difficulty sleeping, abdominal distention, palpitations, pedal edema, chest pain, cough, fatigue or weight gain.   Past Medical History:  Diagnosis Date  . Abdominal aortic aneurysm (AAA) ([place]) 05/13/15   seen on ct scan  . 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 Anemia   . 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 CAD (coronary artery disease)   . Cataract cortical, senile   . CHF (congestive heart failure) ([place])   . Chronic hoarseness   . Exocrine pancreatic insufficiency   . H. pylori infection   . History of hepatitis   . Hyperlipidemia   . Hypertension   . 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 PAF (paroxysmal atrial fibrillation) ([place])   . Prostate CA [facility])    Past Surgical History:  Procedure Laterality Date  . CATARACT EXTRACTION    . COLONOSCOPY  10/06/2014, 09/18/2004, 03/14/2009  . ESOPHAGOGASTRODUODENOSCOPY  10/06/2014, 03/18/2001, 03/14/2009  . ESOPHAGOGASTRODUODENOSCOPY (EGD) WITH PROPOFOL N/A 05/07/2018   Procedure:  ESOPHAGOGASTRODUODENOSCOPY (EGD) WITH PROPOFOL;  Surgeon: Toledo, [name], MD;  Location: [facility] ENDOSCOPY;  Service: Gastroenterology;  Laterality: N/A;  . FLEXIBLE SIGMOIDOSCOPY  08/26/1990  . INSERTION OF ICD    . PROSTATE SURGERY    . TONSILLECTOMY     Family History  Problem Relation Age of Onset  . Heart attack Mother   . Heart attack Father    Social History   Tobacco Use  . Smoking status: Former Smoker    Packs/day: 1.00    Years: 38.00    Pack years: 38.00    Types: Cigarettes    Quit date: 06/18/1999    Years since quitting: 20.2  . Smokeless tobacco: Never Used  Substance Use Topics  . Alcohol use: No    Alcohol/week: 0.0 standard drinks   No Known Allergies Prior to Admission medications   Medication Sig Start Date End Date Taking? Authorizing Provider  albuterol (VENTOLIN HFA) 108 (90 Base) MCG/ACT inhaler Inhale into the lungs as needed.  09/24/18  Yes [provider]  atorvastatin (LIPITOR) 40 MG tablet Take 40 mg by mouth daily.   Yes [provider]  clopidogrel (PLAVIX) 75 MG tablet Take 75 mg by mouth daily. 04/15/18  Yes [provider]  ELIQUIS 5 MG TABS tablet Take 5 mg by mouth every 12 (twelve) hours. 10/21/18  Yes [provider]  furosemide (LASIX) 40 MG tablet Take 40 mg by mouth daily.    Yes [provider]  gabapentin (NEURONTIN) 300 MG capsule TAKE 1 CAPSULE BY MOUTH NIGHTLY. MAY INCREASE TO 2 CAPSULES  AT NIGHT AFTER ONE WEEK IF NO IMPROVEMENT. 12/17/18  Yes [provider]  gabapentin (NEURONTIN) 300 MG capsule Take 600 mg by mouth at bedtime. 10/14/18  Yes [provider]  lipase/protease/amylase (CREON) 12000 units CPEP capsule Take 24,000 Units by mouth 2 (two) times daily at 10 am and 4 pm.   Yes [provider]  lisinopril (PRINIVIL,ZESTRIL) 5 MG tablet Take 5 mg by mouth daily.   Yes [provider]  Melatonin 1 MG TABS Take 1 tablet by mouth at bedtime as needed.  12/27/13  Yes [provider]  metoprolol tartrate (LOPRESSOR) 100 MG tablet Take 1 tablet (100 mg total) by mouth 2 (two) times daily. 09/18/19  Yes Dhungel, Nishant, MD  Multiple Vitamins-Minerals (MULTIVITAMIN WITH MINERALS) tablet Take 1 tablet by mouth daily.   Yes [provider]  omeprazole (PRILOSEC) 20 MG capsule Take 20 mg by mouth daily.   Yes [provider]  tadalafil (CIALIS) 20 MG tablet TAKE 1 TABLET BY MOUTH ONCE DAILY AS NEEDED FOR ERECTILE DYSFUNCTION 07/13/18  Yes [provider]  magnesium (MAGTAB) 84 MG (7MEQ) TBCR SR tablet Take 84 mg by mouth daily.    [provider]     Review of Systems  Constitutional: Negative for appetite change and fatigue.  HENT: Positive for rhinorrhea. Negative for congestion and sore throat.   Eyes: Negative.   Respiratory: Positive for shortness of breath (very little). Negative for cough.   Cardiovascular: Negative for chest pain, palpitations and leg swelling.  Gastrointestinal: Negative for abdominal distention and abdominal pain.  Endocrine: Negative.   Genitourinary: Negative.   Musculoskeletal: Negative for back pain and neck pain.  Skin: Negative.   Allergic/Immunologic: Negative.   Neurological: Positive for light-headedness (when bending over at the waist). Negative for dizziness and headaches.  Hematological: Negative for adenopathy. Bruises/bleeds easily.  Psychiatric/Behavioral: Negative for dysphoric mood and sleep disturbance (sleeping on 1 pillow). The patient is not nervous/anxious.     Vitals:   09/29/19 0917  BP: 140/80  Pulse: 85  Resp: 18  SpO2: 98%  Weight: 176 lb 4 oz (79.9 kg)   Wt Readings from Last 3 Encounters:  09/29/19 176 lb 4 oz (79.9 kg)  09/18/19 177 lb 12.8 oz (80.6 kg)  12/31/18 160 lb (72.6 kg)   Lab Results  Component Value Date   CREATININE 1.48 (H) 09/18/2019   CREATININE 1.86 (H) 09/17/2019   CREATININE 1.16 09/19/2018    Physical  Exam Vitals and nursing note reviewed.  Constitutional:      Appearance: He is well-developed.  HENT:     Head: Normocephalic and atraumatic.  Cardiovascular:     Rate and Rhythm: Normal rate. Rhythm irregular.  Pulmonary:     Effort: Pulmonary effort is normal. No respiratory distress.     Breath sounds: No wheezing or rales.  Musculoskeletal:     Right lower leg: No tenderness. No edema.     Left lower leg: No tenderness. No edema.  Skin:    General: Skin is warm and dry.  Neurological:     General: No focal deficit present.     Mental Status: He is alert and oriented to person, place, and time.  Psychiatric:        Mood and Affect: Mood normal.        Behavior: Behavior normal.     Assessment & Plan:  1: Chronic heart failure with reduced ejection fraction- - NYHA class II - euvolemic today - weighing  daily and he was instructed to call for an overnight weight gain of >2 pounds or a weekly weight gain of >5 pounds - not adding salt and trying to read food labels for sodium content; written dietary information and a low sodium cookbook were given to the patient - saw cardiology (Fath) 08/10/19 - would like to change his lisinopril to entresto but he's not entirely sure of what he's taking and he assumes the list is correct; emphasized bringing medication bottles to every visit - also on BID metoprolol and could consider changing that to metoprolol succinate - BNP 09/18/19 was 511.0 - PharmD reconciled medications w/ the patient  2: HTN- - BP looks good today - saw PCP Kary Kos) 07/26/19 - BMP 09/18/19 reviewed and showed sodium 137, potassium 4.0, creatinine 1.48 and GFR 45  3: Atrial fibrillation- - taking apixaban and clopidogrel - taking BID metoprolol    Patient did not bring his medications and doesn't really know what he's taking. Each medication was verbally reviewed with the patient and she was encouraged to bring the bottles to every visit to confirm accuracy of  list.  Return in 3 weeks or sooner for any questions/problems before then.

## 2019-09-29 ENCOUNTER — Encounter: Payer: Self-pay | Admitting: Family

## 2019-09-29 ENCOUNTER — Ambulatory Visit: Payer: Medicare Other | Attending: Family | Admitting: Family

## 2019-09-29 ENCOUNTER — Other Ambulatory Visit: Payer: Self-pay

## 2019-09-29 VITALS — BP 140/80 | HR 85 | Resp 18 | Ht 68.0 in | Wt 176.2 lb

## 2019-09-29 DIAGNOSIS — Z87891 Personal history of nicotine dependence: Secondary | ICD-10-CM | POA: Insufficient documentation

## 2019-09-29 DIAGNOSIS — I110 Hypertensive heart disease with heart failure: Secondary | ICD-10-CM | POA: Diagnosis not present

## 2019-09-29 DIAGNOSIS — R0602 Shortness of breath: Secondary | ICD-10-CM | POA: Insufficient documentation

## 2019-09-29 DIAGNOSIS — Z7901 Long term (current) use of anticoagulants: Secondary | ICD-10-CM | POA: Diagnosis not present

## 2019-09-29 DIAGNOSIS — Z79899 Other long term (current) drug therapy: Secondary | ICD-10-CM | POA: Insufficient documentation

## 2019-09-29 DIAGNOSIS — E785 Hyperlipidemia, unspecified: Secondary | ICD-10-CM | POA: Insufficient documentation

## 2019-09-29 DIAGNOSIS — I480 Paroxysmal atrial fibrillation: Secondary | ICD-10-CM

## 2019-09-29 DIAGNOSIS — I5022 Chronic systolic (congestive) heart failure: Secondary | ICD-10-CM | POA: Insufficient documentation

## 2019-09-29 DIAGNOSIS — Z8249 Family history of ischemic heart disease and other diseases of the circulatory system: Secondary | ICD-10-CM | POA: Diagnosis not present

## 2019-09-29 DIAGNOSIS — Z7902 Long term (current) use of antithrombotics/antiplatelets: Secondary | ICD-10-CM | POA: Insufficient documentation

## 2019-09-29 DIAGNOSIS — R42 Dizziness and giddiness: Secondary | ICD-10-CM | POA: Insufficient documentation

## 2019-09-29 DIAGNOSIS — I2510 Atherosclerotic heart disease of native coronary artery without angina pectoris: Secondary | ICD-10-CM | POA: Diagnosis not present

## 2019-09-29 DIAGNOSIS — I4891 Unspecified atrial fibrillation: Secondary | ICD-10-CM | POA: Insufficient documentation

## 2019-09-29 DIAGNOSIS — I10 Essential (primary) hypertension: Secondary | ICD-10-CM

## 2019-09-29 NOTE — Patient Instructions (Signed)
Continue weighing daily and call for an overnight weight gain of > 2 pounds or a weekly weight gain of >5 pounds. 

## 2019-10-13 NOTE — Progress Notes (Signed)
Patient ID: Matthew Zamora, male    DOB: [birth year], 79 y.o.   MRN: [id number]  HPI  Mr Matthew Zamora is a 79 y/o male with a history of prostate cancer, CAD, hyperlipidemia, HTN, anemia, paroxysmal atrial fibrillation, previous tobacco use and chronic heart failure.   Echo report from 09/17/19 reviewed and showed an EF of 25-30% along with mild MR and mildly elevated PA pressure.   Admitted 09/17/19 due to acute on chronic HF. Cardiology consult obtained. Initially needed IV lasix and then transitioned to oral diuretics. Placed initially on bipap and then transitioned to nasal cannula. Elevated troponin thought to be due to demand ischemia. Discharged the following day.   He presents today for a follow-up visit with a chief complaint of minimal shortness of breath upon moderate exertion. He describes this as chronic in nature having been present for several years. He has associated light-headedness and easy bruising along with this. He denies any difficulty sleeping, abdominal distention, palpitations, pedal edema, chest pain, cough, fatigue or weight gain.   Brings his home weight chart and home BP readings in for review.   Past Medical History:  Diagnosis Date  . Abdominal aortic aneurysm (AAA) ([place]) 05/13/15   seen on ct scan  . 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 Anemia   . 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 CAD (coronary artery disease)   . Cataract cortical, senile   . CHF (congestive heart failure) ([facility])   . Chronic hoarseness   . Exocrine pancreatic insufficiency   . H. pylori infection   . History of hepatitis   . Hyperlipidemia   . Hypertension   . 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 PAF (paroxysmal atrial fibrillation) ([name])   . Prostate CA [facility])    Past Surgical History:  Procedure Laterality Date  . CATARACT EXTRACTION    . COLONOSCOPY  10/06/2014, 09/18/2004, 03/14/2009  . ESOPHAGOGASTRODUODENOSCOPY  10/06/2014, 03/18/2001, 03/14/2009  .  ESOPHAGOGASTRODUODENOSCOPY (EGD) WITH PROPOFOL N/A 05/07/2018   Procedure: ESOPHAGOGASTRODUODENOSCOPY (EGD) WITH PROPOFOL;  Surgeon: Toledo, [name], MD;  Location: [facility] ENDOSCOPY;  Service: Gastroenterology;  Laterality: N/A;  . FLEXIBLE SIGMOIDOSCOPY  08/26/1990  . INSERTION OF ICD    . PROSTATE SURGERY    . TONSILLECTOMY     Family History  Problem Relation Age of Onset  . Heart attack Mother   . Heart attack Father    Social History   Tobacco Use  . Smoking status: Former Smoker    Packs/day: 1.00    Years: 38.00    Pack years: 38.00    Types: Cigarettes    Quit date: 06/18/1999    Years since quitting: 20.3  . Smokeless tobacco: Never Used  Substance Use Topics  . Alcohol use: No    Alcohol/week: 0.0 standard drinks   No Known Allergies  Prior to Admission medications   Medication Sig Start Date End Date Taking? Authorizing Provider  albuterol (VENTOLIN HFA) 108 (90 Base) MCG/ACT inhaler Inhale into the lungs as needed.  09/24/18  Yes [provider]  atorvastatin (LIPITOR) 40 MG tablet Take 40 mg by mouth daily.   Yes [provider]  clopidogrel (PLAVIX) 75 MG tablet Take 75 mg by mouth daily. 04/15/18  Yes [provider]  ELIQUIS 5 MG TABS tablet Take 5 mg by mouth every 12 (twelve) hours. 10/21/18  Yes [provider]  furosemide (LASIX) 40 MG tablet Take 40 mg by mouth daily.    Yes [provider]  gabapentin (  NEURONTIN) 300 MG capsule 300 mg. Take 2 capsules(600mg ) by mouth nightly 12/17/18  Yes [provider]  lisinopril (PRINIVIL,ZESTRIL) 5 MG tablet Take 5 mg by mouth daily.   Yes [provider]  magnesium (MAGTAB) 84 MG (7MEQ) TBCR SR tablet Take 84 mg by mouth daily.   Yes [provider]  Melatonin 1 MG TABS Take 1 tablet by mouth at bedtime as needed. 12/27/13  Yes [provider]  metoprolol succinate (TOPROL-XL) 100 MG 24 hr tablet Take 100 mg by mouth daily. Take 1 tablet by mouth  daily. Take with or immediately following a meal.   Yes [provider]  Multiple Vitamins-Minerals (MULTIVITAMIN WITH MINERALS) tablet Take 1 tablet by mouth daily.   Yes [provider]  omeprazole (PRILOSEC) 20 MG capsule Take 20 mg by mouth daily.   Yes [provider]  tadalafil (CIALIS) 20 MG tablet TAKE 1 TABLET BY MOUTH ONCE DAILY AS NEEDED FOR ERECTILE DYSFUNCTION 07/13/18  Yes [provider]  gabapentin (NEURONTIN) 300 MG capsule Take 600 mg by mouth at bedtime. 10/14/18   [provider]  lipase/protease/amylase (CREON) 12000 units CPEP capsule Take 24,000 Units by mouth 2 (two) times daily at 10 am and 4 pm.    [provider]     Review of Systems  Constitutional: Negative for appetite change and fatigue.  HENT: Positive for rhinorrhea. Negative for congestion and sore throat.   Eyes: Negative.   Respiratory: Positive for shortness of breath (very little). Negative for cough.   Cardiovascular: Negative for chest pain, palpitations and leg swelling.  Gastrointestinal: Negative for abdominal distention and abdominal pain.  Endocrine: Negative.   Genitourinary: Negative.   Musculoskeletal: Negative for back pain and neck pain.  Skin: Negative.   Allergic/Immunologic: Negative.   Neurological: Positive for light-headedness (when bending over at the waist). Negative for dizziness and headaches.  Hematological: Negative for adenopathy. Bruises/bleeds easily.  Psychiatric/Behavioral: Negative for dysphoric mood and sleep disturbance (sleeping on 1 pillow). The patient is not nervous/anxious.    Vitals:   10/18/19 1008  BP: 128/72  Pulse: 84  Resp: 18  SpO2: 99%  Weight: 177 lb (80.3 kg)  Height: 5\' 9"  (1.753 m)   Wt Readings from Last 3 Encounters:  10/18/19 177 lb (80.3 kg)  09/29/19 176 lb 4 oz (79.9 kg)  09/18/19 177 lb 12.8 oz (80.6 kg)   Lab Results  Component Value Date   CREATININE 1.48 (H) 09/18/2019    CREATININE 1.86 (H) 09/17/2019   CREATININE 1.16 09/19/2018     Physical Exam Vitals and nursing note reviewed.  Constitutional:      Appearance: He is well-developed.  HENT:     Head: Normocephalic and atraumatic.  Cardiovascular:     Rate and Rhythm: Normal rate. Rhythm irregular.  Pulmonary:     Effort: Pulmonary effort is normal. No respiratory distress.     Breath sounds: No wheezing or rales.  Musculoskeletal:     Right lower leg: No tenderness. No edema.     Left lower leg: No tenderness. No edema.  Skin:    General: Skin is warm and dry.  Neurological:     General: No focal deficit present.     Mental Status: He is alert and oriented to person, place, and time.  Psychiatric:        Mood and Affect: Mood normal.        Behavior: Behavior normal.     Assessment & Plan:  1: Chronic heart failure with reduced ejection fraction- - NYHA class II - euvolemic today - weighing daily; reminded to call for an overnight weight gain of >2 pounds or a weekly weight gain of >5 pounds - weight stable from last visit here 2 weeks ago - not adding salt and trying to read food labels for sodium content - saw cardiology (Fath) 08/10/19 - unsure if BP could tolerate changing his lisinopril to entresto as home BP readings have ranged from 108-113/ 66-88 - BNP 09/18/19 was 511.0 - PharmD reconciled medications w/ the patient  2: HTN- - BP looks good today; home readings lower per above - saw PCP Kary Kos) 07/26/19 - BMP 09/18/19 reviewed and showed sodium 137, potassium 4.0, creatinine 1.48 and GFR 45    Medication bottles were reviewed.  Return in 3 months or sooner for any questions/problems before then.

## 2019-10-18 ENCOUNTER — Ambulatory Visit: Payer: Medicare Other | Attending: Family | Admitting: Family

## 2019-10-18 ENCOUNTER — Encounter: Payer: Self-pay | Admitting: Family

## 2019-10-18 ENCOUNTER — Other Ambulatory Visit: Payer: Self-pay

## 2019-10-18 VITALS — BP 128/72 | HR 84 | Resp 18 | Ht 69.0 in | Wt 177.0 lb

## 2019-10-18 DIAGNOSIS — Z7901 Long term (current) use of anticoagulants: Secondary | ICD-10-CM | POA: Insufficient documentation

## 2019-10-18 DIAGNOSIS — I2510 Atherosclerotic heart disease of native coronary artery without angina pectoris: Secondary | ICD-10-CM | POA: Diagnosis not present

## 2019-10-18 DIAGNOSIS — I480 Paroxysmal atrial fibrillation: Secondary | ICD-10-CM | POA: Diagnosis not present

## 2019-10-18 DIAGNOSIS — I5022 Chronic systolic (congestive) heart failure: Secondary | ICD-10-CM | POA: Insufficient documentation

## 2019-10-18 DIAGNOSIS — Z79899 Other long term (current) drug therapy: Secondary | ICD-10-CM | POA: Diagnosis not present

## 2019-10-18 DIAGNOSIS — Z87891 Personal history of nicotine dependence: Secondary | ICD-10-CM | POA: Insufficient documentation

## 2019-10-18 DIAGNOSIS — E785 Hyperlipidemia, unspecified: Secondary | ICD-10-CM | POA: Diagnosis not present

## 2019-10-18 DIAGNOSIS — Z7902 Long term (current) use of antithrombotics/antiplatelets: Secondary | ICD-10-CM | POA: Diagnosis not present

## 2019-10-18 DIAGNOSIS — Z8249 Family history of ischemic heart disease and other diseases of the circulatory system: Secondary | ICD-10-CM | POA: Insufficient documentation

## 2019-10-18 DIAGNOSIS — R0602 Shortness of breath: Secondary | ICD-10-CM | POA: Insufficient documentation

## 2019-10-18 DIAGNOSIS — I110 Hypertensive heart disease with heart failure: Secondary | ICD-10-CM | POA: Diagnosis not present

## 2019-10-18 DIAGNOSIS — R42 Dizziness and giddiness: Secondary | ICD-10-CM | POA: Insufficient documentation

## 2019-10-18 DIAGNOSIS — I10 Essential (primary) hypertension: Secondary | ICD-10-CM

## 2019-10-18 NOTE — Patient Instructions (Signed)
Continue weighing daily and call for an overnight weight gain of > 2 pounds or a weekly weight gain of >5 pounds. 

## 2019-10-25 ENCOUNTER — Inpatient Hospital Stay
Admission: EM | Admit: 2019-10-25 | Discharge: 2019-10-29 | DRG: 280 | Disposition: A | Discharge status: Other Acute Inpatient Hospital | Payer: Medicare Other | Attending: Internal Medicine | Admitting: Internal Medicine

## 2019-10-25 ENCOUNTER — Other Ambulatory Visit: Payer: Self-pay

## 2019-10-25 DIAGNOSIS — Z20822 Contact with and (suspected) exposure to covid-19: Secondary | ICD-10-CM | POA: Diagnosis present

## 2019-10-25 DIAGNOSIS — N170 Acute kidney failure with tubular necrosis: Secondary | ICD-10-CM | POA: Diagnosis present

## 2019-10-25 DIAGNOSIS — Z7901 Long term (current) use of anticoagulants: Secondary | ICD-10-CM

## 2019-10-25 DIAGNOSIS — I130 Hypertensive heart and chronic kidney disease with heart failure and stage 1 through stage 4 chronic kidney disease, or unspecified chronic kidney disease: Secondary | ICD-10-CM | POA: Diagnosis present

## 2019-10-25 DIAGNOSIS — I4891 Unspecified atrial fibrillation: Secondary | ICD-10-CM

## 2019-10-25 DIAGNOSIS — Z4502 Encounter for adjustment and management of automatic implantable cardiac defibrillator: Secondary | ICD-10-CM

## 2019-10-25 DIAGNOSIS — I5043 Acute on chronic combined systolic (congestive) and diastolic (congestive) heart failure: Secondary | ICD-10-CM | POA: Diagnosis present

## 2019-10-25 DIAGNOSIS — R000 Tachycardia, unspecified: Secondary | ICD-10-CM | POA: Diagnosis present

## 2019-10-25 DIAGNOSIS — Z515 Encounter for palliative care: Secondary | ICD-10-CM | POA: Diagnosis not present

## 2019-10-25 DIAGNOSIS — Z8249 Family history of ischemic heart disease and other diseases of the circulatory system: Secondary | ICD-10-CM | POA: Diagnosis not present

## 2019-10-25 DIAGNOSIS — I472 Ventricular tachycardia: Secondary | ICD-10-CM | POA: Diagnosis present

## 2019-10-25 DIAGNOSIS — Z79899 Other long term (current) drug therapy: Secondary | ICD-10-CM | POA: Diagnosis not present

## 2019-10-25 DIAGNOSIS — R570 Cardiogenic shock: Secondary | ICD-10-CM | POA: Diagnosis present

## 2019-10-25 DIAGNOSIS — I214 Non-ST elevation (NSTEMI) myocardial infarction: Secondary | ICD-10-CM | POA: Diagnosis present

## 2019-10-25 DIAGNOSIS — J9622 Acute and chronic respiratory failure with hypercapnia: Secondary | ICD-10-CM | POA: Diagnosis present

## 2019-10-25 DIAGNOSIS — F419 Anxiety disorder, unspecified: Secondary | ICD-10-CM | POA: Diagnosis present

## 2019-10-25 DIAGNOSIS — I509 Heart failure, unspecified: Secondary | ICD-10-CM

## 2019-10-25 DIAGNOSIS — Z87891 Personal history of nicotine dependence: Secondary | ICD-10-CM | POA: Diagnosis not present

## 2019-10-25 DIAGNOSIS — I10 Essential (primary) hypertension: Secondary | ICD-10-CM | POA: Diagnosis present

## 2019-10-25 DIAGNOSIS — Z955 Presence of coronary angioplasty implant and graft: Secondary | ICD-10-CM

## 2019-10-25 DIAGNOSIS — E871 Hypo-osmolality and hyponatremia: Secondary | ICD-10-CM | POA: Diagnosis present

## 2019-10-25 DIAGNOSIS — I482 Chronic atrial fibrillation, unspecified: Secondary | ICD-10-CM | POA: Diagnosis present

## 2019-10-25 DIAGNOSIS — D638 Anemia in other chronic diseases classified elsewhere: Secondary | ICD-10-CM | POA: Diagnosis present

## 2019-10-25 DIAGNOSIS — I2510 Atherosclerotic heart disease of native coronary artery without angina pectoris: Secondary | ICD-10-CM | POA: Diagnosis present

## 2019-10-25 DIAGNOSIS — E785 Hyperlipidemia, unspecified: Secondary | ICD-10-CM | POA: Diagnosis present

## 2019-10-25 DIAGNOSIS — Z452 Encounter for adjustment and management of vascular access device: Secondary | ICD-10-CM

## 2019-10-25 DIAGNOSIS — Z8546 Personal history of malignant neoplasm of prostate: Secondary | ICD-10-CM | POA: Diagnosis not present

## 2019-10-25 DIAGNOSIS — M898X9 Other specified disorders of bone, unspecified site: Secondary | ICD-10-CM | POA: Diagnosis present

## 2019-10-25 DIAGNOSIS — N184 Chronic kidney disease, stage 4 (severe): Secondary | ICD-10-CM | POA: Diagnosis present

## 2019-10-25 DIAGNOSIS — I480 Paroxysmal atrial fibrillation: Secondary | ICD-10-CM | POA: Diagnosis present

## 2019-10-25 DIAGNOSIS — N2581 Secondary hyperparathyroidism of renal origin: Secondary | ICD-10-CM | POA: Diagnosis present

## 2019-10-25 DIAGNOSIS — Z7189 Other specified counseling: Secondary | ICD-10-CM | POA: Diagnosis not present

## 2019-10-25 DIAGNOSIS — Z95810 Presence of automatic (implantable) cardiac defibrillator: Secondary | ICD-10-CM

## 2019-10-25 DIAGNOSIS — Z7902 Long term (current) use of antithrombotics/antiplatelets: Secondary | ICD-10-CM

## 2019-10-25 DIAGNOSIS — I1310 Hypertensive heart and chronic kidney disease without heart failure, with stage 1 through stage 4 chronic kidney disease, or unspecified chronic kidney disease: Secondary | ICD-10-CM

## 2019-10-25 DIAGNOSIS — J9621 Acute and chronic respiratory failure with hypoxia: Secondary | ICD-10-CM | POA: Diagnosis present

## 2019-10-25 DIAGNOSIS — Z9289 Personal history of other medical treatment: Secondary | ICD-10-CM

## 2019-10-25 LAB — BASIC METABOLIC PANEL
Anion gap: 13 (ref 5–15)
BUN: 32 mg/dL — ABNORMAL HIGH (ref 8–23)
CO2: 21 mmol/L — ABNORMAL LOW (ref 22–32)
Calcium: 9.4 mg/dL (ref 8.9–10.3)
Chloride: 103 mmol/L (ref 98–111)
Creatinine, Ser: 2.39 mg/dL — ABNORMAL HIGH (ref 0.61–1.24)
GFR calc Af Amer: 29 mL/min — ABNORMAL LOW (ref >60)
GFR calc non Af Amer: 25 mL/min — ABNORMAL LOW (ref >60)
Glucose, Bld: 199 mg/dL — ABNORMAL HIGH (ref 70–99)
Potassium: 3.4 mmol/L — ABNORMAL LOW (ref 3.5–5.1)
Sodium: 137 mmol/L (ref 135–145)

## 2019-10-25 LAB — CBC WITH DIFFERENTIAL/PLATELET
Abs Immature Granulocytes: 0.03 10*3/uL (ref 0.00–0.07)
Basophils Absolute: 0 10*3/uL (ref 0.0–0.1)
Basophils Relative: 0 %
Eosinophils Absolute: 0 10*3/uL (ref 0.0–0.5)
Eosinophils Relative: 0 %
HCT: 33.3 % — ABNORMAL LOW (ref 39.0–52.0)
Hemoglobin: 11 g/dL — ABNORMAL LOW (ref 13.0–17.0)
Immature Granulocytes: 0 %
Lymphocytes Relative: 3 %
Lymphs Abs: 0.2 10*3/uL — ABNORMAL LOW (ref 0.7–4.0)
MCH: 30.4 pg (ref 26.0–34.0)
MCHC: 33 g/dL (ref 30.0–36.0)
MCV: 92 fL (ref 80.0–100.0)
Monocytes Absolute: 0.3 10*3/uL (ref 0.1–1.0)
Monocytes Relative: 5 %
Neutro Abs: 6.1 10*3/uL (ref 1.7–7.7)
Neutrophils Relative %: 92 %
Platelets: 180 10*3/uL (ref 150–400)
RBC: 3.62 MIL/uL — ABNORMAL LOW (ref 4.22–5.81)
RDW: 12.6 % (ref 11.5–15.5)
WBC: 6.7 10*3/uL (ref 4.0–10.5)
nRBC: 0 % (ref 0.0–0.2)

## 2019-10-25 LAB — TROPONIN I (HIGH SENSITIVITY): Troponin I (High Sensitivity): 1068 ng/L (ref <18)

## 2019-10-25 MED ORDER — DILTIAZEM HCL 25 MG/5ML IV SOLN
15.0000 mg | Freq: Once | INTRAVENOUS | Status: AC
  Administered 2019-10-25: 15 mg via INTRAVENOUS
  Filled 2019-10-25: qty 5

## 2019-10-25 MED ORDER — AMIODARONE HCL IN DEXTROSE 360-4.14 MG/200ML-% IV SOLN
60.0000 mg/h | INTRAVENOUS | Status: AC
  Administered 2019-10-25: 60 mg/h via INTRAVENOUS
  Filled 2019-10-25: qty 200

## 2019-10-25 MED ORDER — SODIUM CHLORIDE 0.9 % IV BOLUS
500.0000 mL | Freq: Once | INTRAVENOUS | Status: AC
  Administered 2019-10-25: 500 mL via INTRAVENOUS

## 2019-10-25 MED ORDER — POTASSIUM CHLORIDE CRYS ER 20 MEQ PO TBCR
20.0000 meq | EXTENDED_RELEASE_TABLET | Freq: Once | ORAL | Status: AC
  Administered 2019-10-25: 20 meq via ORAL
  Filled 2019-10-25: qty 1

## 2019-10-25 MED ORDER — AMIODARONE HCL IN DEXTROSE 360-4.14 MG/200ML-% IV SOLN
60.0000 mg/h | INTRAVENOUS | Status: DC
  Administered 2019-10-26 (×2): 30 mg/h via INTRAVENOUS
  Administered 2019-10-27 – 2019-10-28 (×5): 60 mg/h via INTRAVENOUS
  Filled 2019-10-25 (×7): qty 200

## 2019-10-25 NOTE — ED Triage Notes (Signed)
Pt arrives via [facility] from home for c/o defibrillator shocking him multiple times today. On EMS arrival pt was pale and diaphoretic with HR in the 200s showing v-tach. Pt shocked from v-tach to afib. Pt given 2mg  of versed and 150mg  amio bolus. PT A&Ox4 and in NAD at this time, speaking with Dr. Archie Balboa and following commands.

## 2019-10-25 NOTE — ED Notes (Signed)
Pt's family member Joellen Jersey updated on pt's status with pt's permission.

## 2019-10-25 NOTE — ED Notes (Signed)
[employer] pacemaker interrogated °

## 2019-10-25 NOTE — ED Provider Notes (Signed)
[facility] Emergency Department Provider Note  ____________________________________________   I have reviewed the triage vital signs and the nursing notes.   HISTORY  Chief Complaint Tachycardia   History limited by: Not Limited   HPI Matthew Zamora is a 79 y.o. male who presents to the emergency department today because of concern for defibrillator discharging. The patient states that he started feeling shocks last night. They continued throughout the day. He thinks he was shocked at least a couple dozen times. Patient was given 150mg  amiodarone by EMS. The patient does have associated discomfort in his chest. Denies any recent illness or shortness of breath. Denies any recent fever.   Records reviewed. Per medical record review patient has a history of paroxysmal atrial fibrillation.  Past Medical History:  Diagnosis Date  . Abdominal aortic aneurysm (AAA) ([place]) 05/13/15   seen on ct scan  . 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 Anemia   . 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 CAD (coronary artery disease)   . Cataract cortical, senile   . CHF (congestive heart failure) ([place])   . Chronic hoarseness   . Exocrine pancreatic insufficiency   . H. pylori infection   . History of hepatitis   . Hyperlipidemia   . Hypertension   . 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 PAF (paroxysmal atrial fibrillation) ([place])   . Prostate CA [facility])     Patient Active Problem List   Diagnosis Date Noted  . CHF (congestive heart failure) ([place]) 09/18/2019  . Acute on chronic combined systolic and diastolic CHF (congestive heart failure) ([place]) 09/17/2019  . CAD (coronary artery disease) 09/17/2019  . Ischemic cardiomyopathy 09/17/2019  . CKD (chronic kidney disease) stage 3, GFR 30-59 ml/min 09/17/2019  . Acute hypoxemic respiratory failure ([place]) 09/17/2018  . AAA (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 ([place]) 07/06/2018  . Essential hypertension  07/06/2018  . Hyperlipidemia 07/06/2018  . Paroxysmal atrial fibrillation ([name])   . Acute on chronic respiratory failure with hypoxia ([place]) 04/26/2018    Past Surgical History:  Procedure Laterality Date  . CATARACT EXTRACTION    . COLONOSCOPY  10/06/2014, 09/18/2004, 03/14/2009  . ESOPHAGOGASTRODUODENOSCOPY  10/06/2014, 03/18/2001, 03/14/2009  . ESOPHAGOGASTRODUODENOSCOPY (EGD) WITH PROPOFOL N/A 05/07/2018   Procedure: ESOPHAGOGASTRODUODENOSCOPY (EGD) WITH PROPOFOL;  Surgeon: Toledo, [name], MD;  Location: [facility] ENDOSCOPY;  Service: Gastroenterology;  Laterality: N/A;  . FLEXIBLE SIGMOIDOSCOPY  08/26/1990  . INSERTION OF ICD    . PROSTATE SURGERY    . TONSILLECTOMY      Prior to Admission medications   Medication Sig Start Date End Date Taking? Authorizing Provider  albuterol (VENTOLIN HFA) 108 (90 Base) MCG/ACT inhaler Inhale into the lungs as needed.  09/24/18   [provider]  atorvastatin (LIPITOR) 40 MG tablet Take 40 mg by mouth daily.    [provider]  clopidogrel (PLAVIX) 75 MG tablet Take 75 mg by mouth daily. 04/15/18   [provider]  ELIQUIS 5 MG TABS tablet Take 5 mg by mouth every 12 (twelve) hours. 10/21/18   [provider]  furosemide (LASIX) 40 MG tablet Take 40 mg by mouth daily.     [provider]  gabapentin (NEURONTIN) 300 MG capsule 300 mg. Take 2 capsules(600mg ) by mouth nightly 12/17/18   [provider]  lipase/protease/amylase (CREON) 12000 units CPEP capsule Take 24,000 Units by mouth 2 (two) times daily at 10 am and 4 pm.    [provider]  lisinopril (PRINIVIL,ZESTRIL) 5 MG  tablet Take 5 mg by mouth daily.    [provider]  magnesium (MAGTAB) 84 MG (7MEQ) TBCR SR tablet Take 84 mg by mouth daily.    [provider]  Melatonin 1 MG TABS Take 1 tablet by mouth at bedtime as needed. 12/27/13   [provider]  metoprolol succinate (TOPROL-XL) 100 MG 24 hr tablet Take 100  mg by mouth daily. Take 1 tablet by mouth daily. Take with or immediately following a meal.    [provider]  Multiple Vitamins-Minerals (MULTIVITAMIN WITH MINERALS) tablet Take 1 tablet by mouth daily.    [provider]  omeprazole (PRILOSEC) 20 MG capsule Take 20 mg by mouth daily.    [provider]  tadalafil (CIALIS) 20 MG tablet TAKE 1 TABLET BY MOUTH ONCE DAILY AS NEEDED FOR ERECTILE DYSFUNCTION 07/13/18   [provider]    Allergies Patient has no known allergies.  Family History  Problem Relation Age of Onset  . Heart attack Mother   . Heart attack Father     Social History Social History   Tobacco Use  . Smoking status: Former Smoker    Packs/day: 1.00    Years: 38.00    Pack years: 38.00    Types: Cigarettes    Quit date: 06/18/1999    Years since quitting: 20.3  . Smokeless tobacco: Never Used  Substance Use Topics  . Alcohol use: No    Alcohol/week: 0.0 standard drinks  . Drug use: Not Currently    Review of Systems Constitutional: No fever/chills Eyes: No visual changes. ENT: No sore throat. Cardiovascular: Positive for chest discomfort. Defibrillator discharge.  Respiratory: Denies shortness of breath. Gastrointestinal: No abdominal pain.  No nausea, no vomiting.  No diarrhea.   Genitourinary: Negative for dysuria. Musculoskeletal: Negative for back pain. Skin: Negative for rash. Neurological: Negative for headaches, focal weakness or numbness.  ____________________________________________   PHYSICAL EXAM:  VITAL SIGNS: ED Triage Vitals  Enc Vitals Group     BP 10/25/19 2112 131/87     Pulse Rate 10/25/19 2112 (!) 132     Resp 10/25/19 2112 20     Temp 10/25/19 2112 99.6 F (37.6 C)     Temp Source 10/25/19 2112 Oral     SpO2 10/25/19 2112 97 %     Weight 10/25/19 2108 169 lb 15.6 oz (77.1 kg)     Height 10/25/19 2108 5\' 9"  (1.753 m)   Constitutional: Alert and oriented.  Eyes: Conjunctivae are normal.   ENT      Head: Normocephalic and atraumatic.      Nose: No congestion/rhinnorhea.      Mouth/Throat: Mucous membranes are moist.      Neck: No stridor. Hematological/Lymphatic/Immunilogical: No cervical lymphadenopathy. Cardiovascular: Increased heart rate, irregularly irregular rhythm.  Respiratory: Normal respiratory effort without tachypnea nor retractions. Breath sounds are clear and equal bilaterally. No wheezes/rales/rhonchi. Gastrointestinal: Soft and non tender. No rebound. No guarding.  Genitourinary: Deferred Musculoskeletal: Normal range of motion in all extremities. No lower extremity edema. Neurologic:  Normal speech and language. No gross focal neurologic deficits are appreciated.  Skin:  Skin is warm, dry and intact. No rash noted. Psychiatric: Mood and affect are normal. Speech and behavior are normal. Patient exhibits appropriate insight and judgment.  ____________________________________________    LABS (pertinent positives/negatives)  Trop hs 1068 BMP na 137, k 3.4, cr 2.39 CBC wbc 6.7, hgb 11.0, plt 180  ____________________________________________   EKG  I, Nance Pear, attending physician,  personally viewed and interpreted this EKG  EKG Time: 2108 Rate: 127 Rhythm: atrial fibrillation Axis: left axis deviation Intervals: qtc 409 QRS: intraventricular conduction delay ST changes: no st elevation Impression: abnormal ekg   ____________________________________________    RADIOLOGY  None  ____________________________________________   PROCEDURES  Procedures  ____________________________________________   INITIAL IMPRESSION / ASSESSMENT AND PLAN / ED COURSE  Pertinent labs & imaging results that were available during my care of the patient were reviewed by me and considered in my medical decision making (see chart for details).   Patient presented to the emergency department today because of concerns for defibrillator discharge.   States it is happened over a couple dozen times.  His defibrillator was interrogated and he has had multiple shocks.  He had already received amiodarone 150 by EMS.  I gave him 15 mg of diltiazem given A. fib with RVR.  I discussed with Dr. Ubaldo Glassing with cardiology who recommended starting patient on amnioinfusion.  Will plan on admission to the hospital.   ____________________________________________   FINAL CLINICAL IMPRESSION(S) / ED DIAGNOSES  Final diagnoses:  Defibrillator discharge  Atrial fibrillation with RVR [facility])     Note: This dictation was prepared with Dragon dictation. Any transcriptional errors that result from this process are unintentional     Nance Pear, MD 10/26/19 2000

## 2019-10-26 LAB — CBC
HCT: 31.9 % — ABNORMAL LOW (ref 39.0–52.0)
Hemoglobin: 10.4 g/dL — ABNORMAL LOW (ref 13.0–17.0)
MCH: 30.1 pg (ref 26.0–34.0)
MCHC: 32.6 g/dL (ref 30.0–36.0)
MCV: 92.2 fL (ref 80.0–100.0)
Platelets: 174 10*3/uL (ref 150–400)
RBC: 3.46 MIL/uL — ABNORMAL LOW (ref 4.22–5.81)
RDW: 12.7 % (ref 11.5–15.5)
WBC: 8.1 10*3/uL (ref 4.0–10.5)
nRBC: 0 % (ref 0.0–0.2)

## 2019-10-26 LAB — LIPID PANEL
Cholesterol: 139 mg/dL (ref 0–200)
HDL: 34 mg/dL — ABNORMAL LOW (ref >40)
LDL Cholesterol: 88 mg/dL (ref 0–99)
Total CHOL/HDL Ratio: 4.1 RATIO
Triglycerides: 87 mg/dL (ref <150)
VLDL: 17 mg/dL (ref 0–40)

## 2019-10-26 LAB — BASIC METABOLIC PANEL
Anion gap: 11 (ref 5–15)
BUN: 37 mg/dL — ABNORMAL HIGH (ref 8–23)
CO2: 24 mmol/L (ref 22–32)
Calcium: 9 mg/dL (ref 8.9–10.3)
Chloride: 103 mmol/L (ref 98–111)
Creatinine, Ser: 2.35 mg/dL — ABNORMAL HIGH (ref 0.61–1.24)
GFR calc Af Amer: 30 mL/min — ABNORMAL LOW (ref >60)
GFR calc non Af Amer: 26 mL/min — ABNORMAL LOW (ref >60)
Glucose, Bld: 132 mg/dL — ABNORMAL HIGH (ref 70–99)
Potassium: 4.2 mmol/L (ref 3.5–5.1)
Sodium: 138 mmol/L (ref 135–145)

## 2019-10-26 LAB — TROPONIN I (HIGH SENSITIVITY): Troponin I (High Sensitivity): 1452 ng/L (ref <18)

## 2019-10-26 LAB — GLUCOSE, CAPILLARY: Glucose-Capillary: 126 mg/dL — ABNORMAL HIGH (ref 70–99)

## 2019-10-26 LAB — SARS CORONAVIRUS 2 BY RT PCR (HOSPITAL ORDER, PERFORMED IN ~~LOC~~ HOSPITAL LAB): SARS Coronavirus 2: NEGATIVE

## 2019-10-26 MED ORDER — LISINOPRIL 5 MG PO TABS
5.0000 mg | ORAL_TABLET | Freq: Every day | ORAL | Status: DC
  Administered 2019-10-26 – 2019-10-27 (×2): 5 mg via ORAL
  Filled 2019-10-26 (×2): qty 1

## 2019-10-26 MED ORDER — ALBUTEROL SULFATE (2.5 MG/3ML) 0.083% IN NEBU
2.5000 mg | INHALATION_SOLUTION | RESPIRATORY_TRACT | Status: DC | PRN
  Administered 2019-10-27: 2.5 mg via RESPIRATORY_TRACT
  Filled 2019-10-26: qty 3

## 2019-10-26 MED ORDER — ATORVASTATIN CALCIUM 20 MG PO TABS
40.0000 mg | ORAL_TABLET | Freq: Every day | ORAL | Status: DC
  Administered 2019-10-26 – 2019-10-28 (×3): 40 mg via ORAL
  Filled 2019-10-26 (×3): qty 2

## 2019-10-26 MED ORDER — FUROSEMIDE 20 MG PO TABS
40.0000 mg | ORAL_TABLET | Freq: Every day | ORAL | Status: DC
  Administered 2019-10-26 – 2019-10-27 (×2): 40 mg via ORAL
  Filled 2019-10-26: qty 2
  Filled 2019-10-26: qty 1

## 2019-10-26 MED ORDER — AMIODARONE IV BOLUS ONLY 150 MG/100ML
150.0000 mg | Freq: Once | INTRAVENOUS | Status: DC
  Filled 2019-10-26: qty 100

## 2019-10-26 MED ORDER — MELATONIN 5 MG PO TABS
2.5000 mg | ORAL_TABLET | Freq: Every evening | ORAL | Status: DC | PRN
  Administered 2019-10-28: 2.5 mg via ORAL
  Filled 2019-10-26 (×2): qty 1
  Filled 2019-10-26: qty 0.5

## 2019-10-26 MED ORDER — PANTOPRAZOLE SODIUM 40 MG PO TBEC
40.0000 mg | DELAYED_RELEASE_TABLET | Freq: Every day | ORAL | Status: DC
  Administered 2019-10-26 – 2019-10-28 (×3): 40 mg via ORAL
  Filled 2019-10-26 (×4): qty 1

## 2019-10-26 MED ORDER — AMIODARONE IV BOLUS ONLY 150 MG/100ML
150.0000 mg | Freq: Once | INTRAVENOUS | Status: AC
  Administered 2019-10-26: 150 mg via INTRAVENOUS
  Filled 2019-10-26: qty 100

## 2019-10-26 MED ORDER — METOPROLOL SUCCINATE ER 50 MG PO TB24
100.0000 mg | ORAL_TABLET | Freq: Every day | ORAL | Status: DC
  Administered 2019-10-26 – 2019-10-27 (×2): 100 mg via ORAL
  Filled 2019-10-26 (×2): qty 2

## 2019-10-26 MED ORDER — ONDANSETRON HCL 4 MG/2ML IJ SOLN
4.0000 mg | Freq: Four times a day (QID) | INTRAMUSCULAR | Status: DC | PRN
  Administered 2019-10-27: 4 mg via INTRAVENOUS
  Filled 2019-10-26: qty 2

## 2019-10-26 MED ORDER — ACETAMINOPHEN 325 MG PO TABS
650.0000 mg | ORAL_TABLET | ORAL | Status: DC | PRN
  Administered 2019-10-26 – 2019-10-28 (×3): 650 mg via ORAL
  Filled 2019-10-26 (×3): qty 2

## 2019-10-26 MED ORDER — APIXABAN 5 MG PO TABS
5.0000 mg | ORAL_TABLET | Freq: Two times a day (BID) | ORAL | Status: DC
  Administered 2019-10-26 – 2019-10-28 (×7): 5 mg via ORAL
  Filled 2019-10-26 (×7): qty 1

## 2019-10-26 MED ORDER — CLOPIDOGREL BISULFATE 75 MG PO TABS
75.0000 mg | ORAL_TABLET | Freq: Every day | ORAL | Status: DC
  Administered 2019-10-26 – 2019-10-28 (×3): 75 mg via ORAL
  Filled 2019-10-26 (×4): qty 1

## 2019-10-26 MED ORDER — PANCRELIPASE (LIP-PROT-AMYL) 12000-38000 UNITS PO CPEP
24000.0000 [IU] | ORAL_CAPSULE | Freq: Two times a day (BID) | ORAL | Status: DC
  Administered 2019-10-26 – 2019-10-28 (×5): 24000 [IU] via ORAL
  Filled 2019-10-26 (×6): qty 2

## 2019-10-26 MED ORDER — ADULT MULTIVITAMIN W/MINERALS CH
1.0000 | ORAL_TABLET | Freq: Every day | ORAL | Status: DC
  Administered 2019-10-26 – 2019-10-28 (×3): 1 via ORAL
  Filled 2019-10-26 (×3): qty 1

## 2019-10-26 MED ORDER — ALPRAZOLAM 0.5 MG PO TABS
0.2500 mg | ORAL_TABLET | Freq: Once | ORAL | Status: AC
  Administered 2019-10-26: 0.25 mg via ORAL
  Filled 2019-10-26: qty 1

## 2019-10-26 MED ORDER — GABAPENTIN 300 MG PO CAPS
300.0000 mg | ORAL_CAPSULE | Freq: Three times a day (TID) | ORAL | Status: DC
  Administered 2019-10-26 – 2019-10-28 (×9): 300 mg via ORAL
  Filled 2019-10-26 (×9): qty 1

## 2019-10-26 MED ORDER — CHLORHEXIDINE GLUCONATE CLOTH 2 % EX PADS
6.0000 | MEDICATED_PAD | Freq: Every day | CUTANEOUS | Status: DC
  Administered 2019-10-27: 6 via TOPICAL
  Filled 2019-10-26: qty 6

## 2019-10-26 MED ORDER — MAGNESIUM CHLORIDE 64 MG PO TBEC
1.0000 | DELAYED_RELEASE_TABLET | Freq: Every day | ORAL | Status: DC
  Administered 2019-10-26 – 2019-10-28 (×3): 64 mg via ORAL
  Filled 2019-10-26 (×5): qty 1

## 2019-10-26 MED ORDER — METOPROLOL TARTRATE 5 MG/5ML IV SOLN: 5.0000 mg | INTRAVENOUS | Status: DC | PRN

## 2019-10-26 NOTE — ED Notes (Signed)
Paged Dr.Patel , return call received , informed Dr.Patel of the approx 6sec run of Vtach , defibrillator shocked him out to a NS rhythm.  Dr.Patel to reach to Dr.Fath

## 2019-10-26 NOTE — Consult Note (Addendum)
Cardiology Consultation Note    Patient ID: Matthew Zamora, MRN: [id number], DOB/AGE: January 03, 1941 79 y.o. Admit date: 10/25/2019   Date of Consult: 10/26/2019 Primary Physician: Maryland Pink, MD Primary Cardiologist: Dr. Ubaldo Glassing  Chief Complaint: multiple aicd shocks Reason for Consultation: aicd shocks Requesting MD: Dr. Posey Pronto  HPI: Matthew Zamora is a 79 y.o. 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, chronic kidney disease, chronic atrial fibrillation and anemia who presents fora follow up visit. He was admitted to [facility] on 09/17/18 with acute on chronic respiratory failure, sepsis, and acute on chronic systolic heart failure. Was discharged and then presented to [facility] on 09/27/18 and underwent cardiac catheterization on 09/28/18 which revealed patent RCA stents with mild in-stent stenosis and severe ostial 80-90% stenosis of PDA branch, stable from previous cath.There was severe ostial 80-90% stenosis of a medium caliber dual PDA branch which was jailed by previous stent. The appearance is stable compared to previous PCI result. There was diffuse ostial/proximal LCx stenosis (30-40%) with a 40-50% distal LCx/OM stenosis. The LAD had diffuse, mild-moderate calcification.  He has been doing fairly well with regard to heart failure, rhythm etc. on metoprolol succinate 100 mg daily.  On day of presentation he noted that he was having repeated shocks from his AICD.  On presentation to the emergency room interrogation of the device revealed that he had appropriate shocks for ventricular tachycardia but also had multiple shocks for A. fib with RVR.  He is currently in atrial fibrillation with fairly rapid ventricular response.  He has had no further shocks.  His potassium was mildly reduced at 3.4 on presentation.  He had acute on chronic renal  insufficiency with a serum creatinine of 2.39 on presentation up from baseline of approximately 1.5.  His cardiac markers were flat at 1068 and 1452.  He has no chest pain.  He is hemodynamically stable.  He reports compliance with his medications including Eliquis Plavix and metoprolol.    Past Medical History:  Diagnosis Date  . Abdominal aortic aneurysm (AAA) ([place]) 05/13/15   seen on ct scan  . 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 Anemia   . 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 CAD (coronary artery disease)   . Cataract cortical, senile   . CHF (congestive heart failure) ([place])   . Chronic hoarseness   . Exocrine pancreatic insufficiency   . H. pylori infection   . History of hepatitis   . Hyperlipidemia   . Hypertension   . 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 PAF (paroxysmal atrial fibrillation) ([place])   . Prostate CA [facility])       Surgical History:  Past Surgical History:  Procedure Laterality Date  . CATARACT EXTRACTION    . COLONOSCOPY  10/06/2014, 09/18/2004, 03/14/2009  . ESOPHAGOGASTRODUODENOSCOPY  10/06/2014, 03/18/2001, 03/14/2009  . ESOPHAGOGASTRODUODENOSCOPY (EGD) WITH PROPOFOL N/A 05/07/2018   Procedure: ESOPHAGOGASTRODUODENOSCOPY (EGD) WITH PROPOFOL;  Surgeon: Toledo, [name], MD;  Location: [facility] ENDOSCOPY;  Service: Gastroenterology;  Laterality: N/A;  . FLEXIBLE SIGMOIDOSCOPY  08/26/1990  . INSERTION OF ICD    . PROSTATE SURGERY    . TONSILLECTOMY       Home Meds: Prior to Admission medications   Medication Sig Start Date End Date Taking? Authorizing Provider  albuterol (VENTOLIN HFA) 108 (90 Base) MCG/ACT inhaler Inhale into the lungs as needed.  09/24/18  Yes [provider]  atorvastatin (LIPITOR) 40 MG tablet Take 40 mg by mouth daily.   Yes [provider]  clopidogrel (PLAVIX) 75 MG tablet Take 75 mg by mouth daily. 04/15/18  Yes [provider]  ELIQUIS 5 MG TABS tablet Take 5 mg by mouth every 12 (twelve) hours.  10/21/18  Yes [provider]  furosemide (LASIX) 40 MG tablet Take 40 mg by mouth daily.    Yes [provider]  gabapentin (NEURONTIN) 300 MG capsule 300 mg. Take 2 capsules(600mg ) by mouth nightly 12/17/18  Yes [provider]  lisinopril (PRINIVIL,ZESTRIL) 5 MG tablet Take 5 mg by mouth daily.   Yes [provider]  magnesium (MAGTAB) 84 MG (7MEQ) TBCR SR tablet Take 84 mg by mouth daily.   Yes [provider]  Melatonin 1 MG TABS Take 1 tablet by mouth at bedtime as needed. 12/27/13  Yes [provider]  metoprolol succinate (TOPROL-XL) 100 MG 24 hr tablet Take 100 mg by mouth daily. Take 1 tablet by mouth daily. Take with or immediately following a meal.   Yes [provider]  Multiple Vitamins-Minerals (MULTIVITAMIN WITH MINERALS) tablet Take 1 tablet by mouth daily.   Yes [provider]  omeprazole (PRILOSEC) 20 MG capsule Take 20 mg by mouth daily.   Yes [provider]  tadalafil (CIALIS) 20 MG tablet TAKE 1 TABLET BY MOUTH ONCE DAILY AS NEEDED FOR ERECTILE DYSFUNCTION 07/13/18  Yes [provider]  lipase/protease/amylase (CREON) 12000 units CPEP capsule Take 24,000 Units by mouth 2 (two) times daily at 10 am and 4 pm.    [provider]    Inpatient Medications:  . apixaban  5 mg Oral Q12H  . atorvastatin  40 mg Oral Daily  . clopidogrel  75 mg Oral Daily  . furosemide  40 mg Oral Daily  . gabapentin  300 mg Oral TID  . lipase/protease/amylase  24,000 Units Oral BID  . lisinopril  5 mg Oral Daily  . magnesium chloride  1 tablet Oral Daily  . metoprolol succinate  100 mg Oral Daily  . multivitamin with minerals  1 tablet Oral Daily  . pantoprazole  40 mg Oral Daily   . amiodarone 30 mg/hr (10/26/19 0419)    Allergies: No Known Allergies  Social History   Socioeconomic History  . Marital status: Single    Spouse name: Not on file  . Number of children: Not on file  . Years of  education: Not on file  . Highest education level: Not on file  Occupational History  . Not on file  Tobacco Use  . Smoking status: Former Smoker    Packs/day: 1.00    Years: 38.00    Pack years: 38.00    Types: Cigarettes    Quit date: 06/18/1999    Years since quitting: 20.3  . Smokeless tobacco: Never Used  Substance and Sexual Activity  . Alcohol use: No    Alcohol/week: 0.0 standard drinks  . Drug use: Not Currently  . Sexual activity: Not Currently  Other Topics Concern  . Not on file  Social History Narrative  . Not on file   Social Determinants of Health   Financial Resource Strain:   . Difficulty of Paying Living Expenses:   Food Insecurity:   . Worried About Charity fundraiser in the Last Year:   . Arboriculturist in the Last Year:   Transportation Needs:   .  Lack of Transportation (Medical):   [name] Lack of Transportation (Non-Medical):   Physical Activity:   . Days of Exercise per Week:   . Minutes of Exercise per Session:   Stress:   . Feeling of Stress :   Social Connections:   . Frequency of Communication with Friends and Family:   . Frequency of Social Gatherings with Friends and Family:   . Attends Religious Services:   . Active Member of Clubs or Organizations:   . Attends Archivist Meetings:   [name] Marital Status:   Intimate Partner Violence:   . Fear of Current or Ex-Partner:   . Emotionally Abused:   [name] Physically Abused:   . Sexually Abused:      Family History  Problem Relation Age of Onset  . Heart attack Mother   . Heart attack Father      Review of Systems: A 12-system review of systems was performed and is negative except as noted in the HPI.  Labs: No results for input(s): CKTOTAL, CKMB, TROPONINI in the last 72 hours. Lab Results  Component Value Date   WBC 8.1 10/26/2019   HGB 10.4 (L) 10/26/2019   HCT 31.9 (L) 10/26/2019   MCV 92.2 10/26/2019   PLT 174 10/26/2019    Recent Labs  Lab 10/26/19 0420  NA 138  K  4.2  CL 103  CO2 24  BUN 37*  CREATININE 2.35*  CALCIUM 9.0  GLUCOSE 132*   Lab Results  Component Value Date   CHOL 139 10/26/2019   HDL 34 (L) 10/26/2019   LDLCALC 88 10/26/2019   TRIG 87 10/26/2019   No results found for: DDIMER  Radiology/Studies:  No results found.  Wt Readings from Last 3 Encounters:  10/25/19 77.1 kg  10/18/19 80.3 kg  09/29/19 79.9 kg    EKG: Atrial fibrillation with rapid ventricular response  Physical Exam: No acute distress Blood pressure (!) 144/83, pulse (!) 128, temperature 98.5 F (36.9 C), temperature source Oral, resp. rate 20, height 5\' 9"  (1.753 m), weight 77.1 kg, SpO2 99 %. Body mass index is 25.1 kg/m. General: Well developed, well nourished, in no acute distress. Head: Normocephalic, atraumatic, sclera non-icteric, no xanthomas, nares are without discharge.  Neck: Negative for carotid bruits. JVD not elevated. Lungs: Clear bilaterally to auscultation without wheezes, rales, or rhonchi. Breathing is unlabored. Heart: A. fib with RVR Abdomen: Soft, non-tender, non-distended with normoactive bowel sounds. No hepatomegaly. No rebound/guarding. No obvious abdominal masses. Msk:  Strength and tone appear normal for age. Extremities: No clubbing or cyanosis. No edema.  Distal pedal pulses are 2+ and equal bilaterally. Neuro: Alert and oriented X 3. No facial asymmetry. No focal deficit. Moves all extremities spontaneously. Psych:  Responds to questions appropriately with a normal affect.     Assessment and Plan  79 y.o. 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 He was admitted to [facility] on 09/17/18 with acute on chronic respiratory failure, sepsis, and acute on chronic systolic heart failure.  Was discharged and then presented to [facility] on 09/27/18 and underwent cardiac catheterization on 09/28/18 which revealed patent RCA stents with mild in-stent stenosis and severe ostial 80-90% stenosis of PDA branch, stable from previous cath.There was severe ostial 80-90% stenosis of a medium  caliber dual PDA branch which was jailed by previous stent. The appearance is stable compared to previous PCI result. There was diffuse ostial/proximal LCx stenosis (30-40%) with a 40-50% distal LCx/OM stenosis. The LAD had diffuse, mild-moderate calcification.  He has been doing fairly well with regard to heart failure, rhythm etc. on metoprolol succinate 100 mg daily.  On day of presentation he noted that he was having repeated shocks from his AICD.  On presentation to the emergency room interrogation of the device revealed that he had appropriate shocks for ventricular tachycardia but also had multiple shocks for A. fib with RVR.  He is currently in atrial fibrillation with fairly rapid ventricular response.  He has had no further shocks.  His potassium was mildly reduced at 3.4 on presentation.  He had acute on chronic renal insufficiency with a serum creatinine of 2.39 on presentation up from baseline of approximately 1.5.  His cardiac markers were flat at 1068 and 1452.  He has no chest pain.  He is hemodynamically stable.  He reports compliance with his medications including Eliquis Plavix and metoprolol.  1.  A. fib/VT --Patient has received AICD treatments.  Review of interrogation of his device showed that these were for ventricular tachycardia as well as for A. fib with RVR.  Patient is on metoprolol succinate at 100 mg as an outpatient anticoagulated with Eliquis.  He reports compliance with his medications.  He denies missing any meds or ingesting any new substances.  He currently is hemodynamically stable but in A. fib with RVR.  Appreciate input from Dr. Adam Phenix from electrophysiology.  Will continue with IV  amiodarone but will rebolus in times of trying to control his A. fib rate.  He converts to sinus rhythm after his AICD shocks but only for short period of time.  We will continue with Eliquis at 5 mg twice daily following renal function.  Patient is less than 41 years of age and greater than 60 kg.  We will continue with IV maintenance rate and metoprolol.  After full load of amiodarone will attempt cardioversion back to sinus rhythm.  We will consult electrophysiology and they will see Thursday morning.  Have discussed this plan with Dr. Caryl Comes.  2.  Elevated troponin -[place]and secondary to RVR and cardioversions.  Does not appear to be having an acute coronary event  3.  CKD-acute on chronic renal insufficiency.  Will closely follow.  4.  CAD-status post coronary PCI.  Does not appear to be having an acute coronary event at present.  5.  HF R EF  -We will continue to follow for evidence of acute heart failure and treat appropriately.  We will continue with metoprolol at 100 mg daily.    Signed, Teodoro Spray MD 10/26/2019, 8:49 AM Pager: [phone]  Addendum:  AICD will be reporgrammed to better deal with the afib with rvr and vt episodes. WIll continue with amiodarone iv load. Have discussed with EP who will see patient on Thursday.

## 2019-10-26 NOTE — ED Notes (Addendum)
Pt assisted with this nurse and Claiborne Billings, RN to move over to hospital bed. Pt able to move self from one to other on own power. Pt provided ginger ale per request and lights dimmed for comfort. Call light in reach. Will continue to monitor.

## 2019-10-26 NOTE — ED Notes (Addendum)
This RN at bedside to answer call bell. Pt stating he cannot stop shivering. Pt afebrile 98.4 oral temp. Pt still NSR with HR 85-90s. Amiodarone continued at 30mg /hr at 16.59mL/hr. MD made aware. No new orders at this time.

## 2019-10-26 NOTE — ED Notes (Addendum)
This RN received report from [place], Therapist, sports. Pt amiodarone infusion running at 30mg /hr at 16.32mL/hr. Pt currently NSR and HR 70-75.

## 2019-10-26 NOTE — H&P (Signed)
History and Physical   Matthew Zamora:[id number] DOB: 1941-01-15 DOA: 10/25/2019  Referring MD/NP/PA: Dr. Archie Balboa  PCP: [place] Pink, MD   Outpatient Specialists: [facility]  Patient coming from: Home  Chief Complaint: Palpitation and weakness  HPI: Matthew Zamora is a 79 y.o. 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  Patient has AICD in place.  He fell it fired several times.  According to patient every time he moved it will fire.  Patient apparently has fallen and has been weak.  Unable to take all his cardiac medications for about a week.  He was seen in the ER with heart rates in the 140s to 150s.  He appears to be in A. fib with RVR but occasionally also supraventricular tachycardia.  Suspected multiple episode of ventricular tachycardia.  Interrogation of the patient's device in the ER showed that it has fired at least 50 times.  He still has a heart rate in the 120s at least.  Cardiology was consulted and recommended admission with initiation of amiodarone drip.  Patient otherwise denies any chest pain.  Denied any shortness of breath beyond his baseline.  He has no evidence of fluid overload beyond reasonable expected.  No fever or chills no sick contacts..  ED Course: Temperature 99.6 blood pressure 130/87 pulse 132 respiratory 22 oxygen sat 95% on room air.  Sodium 137 potassium 3.4 chloride 103 CO2 21 with glucose 199.  Creatinine is 2.39.  He was 1.48 a week ago.  BUN of 32.  Troponin 1068.  Hemoglobin 11.0 otherwise the rest of the CBC is within normal.  EKG showed A. fib with RVR.  Patient being admitted to the hospital for further evaluation.  Amiodarone initiated.  Review of Systems:  As per HPI otherwise 10 point review of systems negative.    Past Medical History:  Diagnosis Date  . Abdominal aortic aneurysm (AAA) ([place]) 05/13/15   seen on ct scan  . 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 Anemia   . 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 CAD (coronary artery disease)   . Cataract cortical, senile   . CHF (congestive heart failure) ([place])   . Chronic hoarseness   . Exocrine pancreatic insufficiency   . H. pylori infection   . History of hepatitis   . Hyperlipidemia   . Hypertension   . 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 PAF (paroxysmal atrial fibrillation) ([place])   . Prostate CA [facility])     Past Surgical History:  Procedure Laterality Date  . CATARACT EXTRACTION    . COLONOSCOPY  10/06/2014, 09/18/2004, 03/14/2009  . ESOPHAGOGASTRODUODENOSCOPY  10/06/2014, 03/18/2001, 03/14/2009  . ESOPHAGOGASTRODUODENOSCOPY (EGD) WITH PROPOFOL N/A 05/07/2018   Procedure: ESOPHAGOGASTRODUODENOSCOPY (EGD) WITH PROPOFOL;  Surgeon: Toledo, [name], MD;  Location: [facility] ENDOSCOPY;  Service: Gastroenterology;  Laterality: N/A;  . FLEXIBLE SIGMOIDOSCOPY  08/26/1990  . INSERTION OF ICD    . PROSTATE SURGERY    . TONSILLECTOMY       reports that he quit smoking about 20 years ago. His smoking use included cigarettes. He has a 38.00 pack-year smoking history. He has never used smokeless tobacco. He reports previous drug use. He reports that he does not drink alcohol.  No Known Allergies  Family History  Problem Relation Age of Onset  .  Heart attack Mother   . Heart attack Father      Prior to Admission medications   Medication Sig Start Date End Date Taking? Authorizing Provider  albuterol (VENTOLIN HFA) 108 (90 Base) MCG/ACT inhaler Inhale into the lungs as needed.  09/24/18  Yes [provider]  atorvastatin (LIPITOR) 40 MG tablet Take 40 mg by mouth daily.   Yes [provider]  clopidogrel (PLAVIX) 75 MG tablet Take 75 mg by mouth daily. 04/15/18   Yes [provider]  ELIQUIS 5 MG TABS tablet Take 5 mg by mouth every 12 (twelve) hours. 10/21/18  Yes [provider]  furosemide (LASIX) 40 MG tablet Take 40 mg by mouth daily.    Yes [provider]  gabapentin (NEURONTIN) 300 MG capsule 300 mg. Take 2 capsules(600mg ) by mouth nightly 12/17/18  Yes [provider]  lisinopril (PRINIVIL,ZESTRIL) 5 MG tablet Take 5 mg by mouth daily.   Yes [provider]  magnesium (MAGTAB) 84 MG (7MEQ) TBCR SR tablet Take 84 mg by mouth daily.   Yes [provider]  Melatonin 1 MG TABS Take 1 tablet by mouth at bedtime as needed. 12/27/13  Yes [provider]  metoprolol succinate (TOPROL-XL) 100 MG 24 hr tablet Take 100 mg by mouth daily. Take 1 tablet by mouth daily. Take with or immediately following a meal.   Yes [provider]  Multiple Vitamins-Minerals (MULTIVITAMIN WITH MINERALS) tablet Take 1 tablet by mouth daily.   Yes [provider]  omeprazole (PRILOSEC) 20 MG capsule Take 20 mg by mouth daily.   Yes [provider]  tadalafil (CIALIS) 20 MG tablet TAKE 1 TABLET BY MOUTH ONCE DAILY AS NEEDED FOR ERECTILE DYSFUNCTION 07/13/18  Yes [provider]  lipase/protease/amylase (CREON) 12000 units CPEP capsule Take 24,000 Units by mouth 2 (two) times daily at 10 am and 4 pm.    [provider]    Physical Exam: Vitals:   10/25/19 2112 10/25/19 2200 10/25/19 2300 10/25/19 2330  BP: 131/87 99/73 114/74 114/74  Pulse: (!) 132 (!) 118 (!) 114 65  Resp: 20 (!) 22 17 (!) 21  Temp: 99.6 F (37.6 C)     TempSrc: Oral     SpO2: 97% 95% 98% 97%  Weight:      Height:          Constitutional: Frail, weak Vitals:   10/25/19 2112 10/25/19 2200 10/25/19 2300 10/25/19 2330  BP: 131/87 99/73 114/74 114/74  Pulse: (!) 132 (!) 118 (!) 114 65  Resp: 20 (!) 22 17 (!) 21  Temp: 99.6 F (37.6 C)     TempSrc: Oral     SpO2: 97% 95% 98% 97%  Weight:       Height:       Eyes: PERRL, lids and conjunctivae normal ENMT: Mucous membranes are moist. Posterior pharynx clear of any exudate or lesions.Normal dentition.  Neck: normal, supple, no masses, no thyromegaly Respiratory: Coarse breath sounds no wheezing, basal crackles, increased respiratory effort. No accessory muscle use.  Cardiovascular: Irregularly irregular with tachycardia, no murmurs / rubs / gallops. No extremity edema. 2+ pedal pulses. No carotid bruits.  Abdomen: no tenderness, no masses palpated. No hepatosplenomegaly. Bowel sounds positive.  Musculoskeletal: no clubbing / cyanosis. No joint deformity upper and lower extremities. Good ROM, no contractures. Normal muscle tone.  Skin: no rashes, lesions, ulcers. No induration Neurologic: CN 2-12 grossly intact. Sensation intact, DTR normal. Strength 5/5 in all 4.  Psychiatric:  Normal judgment and insight. Alert and oriented x 3. Normal mood.     Labs on Admission: I have personally reviewed following labs and imaging studies  CBC: Recent Labs  Lab 10/25/19 2110  WBC 6.7  NEUTROABS 6.1  HGB 11.0*  HCT 33.3*  MCV 92.0  PLT 762   Basic Metabolic Panel: Recent Labs  Lab 10/25/19 2110  NA 137  K 3.4*  CL 103  CO2 21*  GLUCOSE 199*  BUN 32*  CREATININE 2.39*  CALCIUM 9.4   GFR: Estimated Creatinine Clearance: 25.9 mL/min (A) (by C-G formula based on SCr of 2.39 mg/dL (H)). Liver Function Tests: No results for input(s): AST, ALT, ALKPHOS, BILITOT, PROT, ALBUMIN in the last 168 hours. No results for input(s): LIPASE, AMYLASE in the last 168 hours. No results for input(s): AMMONIA in the last 168 hours. Coagulation Profile: No results for input(s): INR, PROTIME in the last 168 hours. Cardiac Enzymes: No results for input(s): CKTOTAL, CKMB, CKMBINDEX, TROPONINI in the last 168 hours. BNP (last 3 results) No results for input(s): PROBNP in the last 8760 hours. HbA1C: No results for input(s): HGBA1C in the last 72  hours. CBG: No results for input(s): GLUCAP in the last 168 hours. Lipid Profile: No results for input(s): CHOL, HDL, LDLCALC, TRIG, CHOLHDL, LDLDIRECT in the last 72 hours. Thyroid Function Tests: No results for input(s): TSH, T4TOTAL, FREET4, T3FREE, THYROIDAB in the last 72 hours. Anemia Panel: No results for input(s): VITAMINB12, FOLATE, FERRITIN, TIBC, IRON, RETICCTPCT in the last 72 hours. Urine analysis:    Component Value Date/Time   COLORURINE YELLOW (A) 09/17/2018 0833   APPEARANCEUR HAZY (A) 09/17/2018 0833   LABSPEC 1.020 09/17/2018 0833   PHURINE 5.0 09/17/2018 0833   GLUCOSEU NEGATIVE 09/17/2018 0833   HGBUR NEGATIVE 09/17/2018 0833   BILIRUBINUR NEGATIVE 09/17/2018 0833   KETONESUR NEGATIVE 09/17/2018 0833   PROTEINUR NEGATIVE 09/17/2018 0833   NITRITE NEGATIVE 09/17/2018 0833   LEUKOCYTESUR SMALL (A) 09/17/2018 0833   Sepsis Labs: @LABRCNTIP (procalcitonin:4,lacticidven:4) )No results found for this or any previous visit (from the past 240 hour(s)).   Radiological Exams on Admission: No results found.  EKG: Independently reviewed.  Atrial fibrillation with rapid ventricular response.  Rate of 127.  No significant ST changes.  Intervals appear to be within normal.  Assessment/Plan Principal Problem:   Tachycardia Active Problems:   Paroxysmal atrial fibrillation ([name])   Essential hypertension   Hyperlipidemia   Acute on chronic combined systolic and diastolic CHF (congestive heart failure) ([name])     #1 rapid A. fib with possible ventricular tachycardia: Patient has pacemaker AICD which has been fired at least 50 times.  Follow cardiology recommendations we will initiate amiodarone drip.  He has not been able to take his home medications for days which may have accounted for this.  I will initiate his home regimen in addition to the amiodarone.  Cardiology to see patient in the morning and adjust medications.  With his severe systolic dysfunction will avoid  Cardizem.  #2 essential hypertension: Continue blood pressure control.  #3 hyperlipidemia: Continue statin  #4 acute renal failure: Appears like acute on chronic kidney failure.  Probably prerenal.  Continue treatment.  #5 elevated troponin: Most likely as a result of tachycardia with severe systolic dysfunction.  Continue monitoring.  #6 severe systolic dysfunction: Patient has EF of 25 to 30%.  He does not have evidence of fluid overload.  No overt pedal edema.  Continue home regimen   DVT prophylaxis: Apixaban Code  Status: Full code Family Communication: No family at bedside Disposition Plan: Home Consults called: Cardiology Dr. Ubaldo Glassing Admission status: Inpatient  Severity of Illness: The appropriate patient status for this patient is INPATIENT. Inpatient status is judged to be reasonable and necessary in order to provide the required intensity of service to ensure the patient's safety. The patient's presenting symptoms, physical exam findings, and initial radiographic and laboratory data in the context of their chronic comorbidities is felt to place them at high risk for further clinical deterioration. Furthermore, it is not anticipated that the patient will be medically stable for discharge from the hospital within 2 midnights of admission. The following factors support the patient status of inpatient.   " The patient's presenting symptoms include Palpitation. " The worrisome physical exam findings include tachycardia. " The initial radiographic and laboratory data are worrisome because of increased troponin. " The chronic co-morbidities include systolic dysfunction CHF.   * I certify that at the point of admission it is my clinical judgment that the patient will require inpatient hospital care spanning beyond 2 midnights from the point of admission due to high intensity of service, high risk for further deterioration and high frequency of surveillance required.Barbette Merino  MD [facility] Pager [phone]  If 7PM-7AM, please contact night-coverage [email or web address] Password [facility]  10/26/2019, 12:27 AM

## 2019-10-26 NOTE — Progress Notes (Signed)
[facility] [place] NAME: Matthew Zamora    MR#:  [id number]  DATE OF BIRTH:  December 06, 1940  SUBJECTIVE:   Seen in the emergency room. Continues to have anxiety about AICD firing. Denies any chest pain. Has had multiple AICD firing from since yesterday. Son in the room. Denies shortness of breath or leg swelling  REVIEW OF SYSTEMS:   Review of Systems  Constitutional: Negative for chills, fever and weight loss.  HENT: Negative for ear discharge, ear pain and nosebleeds.   Eyes: Negative for blurred vision, pain and discharge.  Respiratory: Negative for sputum production, shortness of breath, wheezing and stridor.   Cardiovascular: Positive for chest pain and palpitations. Negative for orthopnea and PND.  Gastrointestinal: Negative for abdominal pain, diarrhea, nausea and vomiting.  Genitourinary: Negative for frequency and urgency.  Musculoskeletal: Positive for falls. Negative for back pain and joint pain.  Neurological: Positive for weakness. Negative for sensory change, speech change and focal weakness.  Psychiatric/Behavioral: Negative for depression and hallucinations. The patient is not nervous/anxious.    Tolerating Diet:yes Tolerating PT: pending  DRUG ALLERGIES:  No Known Allergies  VITALS:  Blood pressure (!) 131/54, pulse 88, temperature 98.5 F (36.9 C), temperature source Oral, resp. rate (!) 27, height 5\' 9"  (1.753 m), weight 77.1 kg, SpO2 100 %.  PHYSICAL EXAMINATION:   Physical Exam  GENERAL:  79 y.o.-year-old patient lying in the bed with no acute distress.  EYES: Pupils equal, round, reactive to light and accommodation. No scleral icterus.   HEENT: Head atraumatic, normocephalic. Oropharynx and nasopharynx clear.  NECK:  Supple, no jugular venous distention. No thyroid enlargement, no tenderness.  LUNGS: Normal breath sounds bilaterally, no wheezing, rales, rhonchi. No use of accessory muscles of respiration.   CARDIOVASCULAR: S1, S2 normal. No murmurs, rubs, or gallops.  ABDOMEN: Soft, nontender, nondistended. Bowel sounds present. No organomegaly or mass.  EXTREMITIES: No cyanosis, clubbing or edema b/l.    NEUROLOGIC: Cranial nerves II through XII are intact. No focal Motor or sensory deficits b/l.   PSYCHIATRIC:  patient is alert and oriented x 3.  SKIN: No obvious rash, lesion, or ulcer.   LABORATORY PANEL:  CBC Recent Labs  Lab 10/26/19 0420  WBC 8.1  HGB 10.4*  HCT 31.9*  PLT 174    Chemistries  Recent Labs  Lab 10/26/19 0420  NA 138  K 4.2  CL 103  CO2 24  GLUCOSE 132*  BUN 37*  CREATININE 2.35*  CALCIUM 9.0   Cardiac Enzymes No results for input(s): TROPONINI in the last 168 hours. RADIOLOGY:  No results found. ASSESSMENT AND PLAN:   Matthew Zamora is a 79 y.o. 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  Patient has AICD in place.  He fell it fired several times.  According to patient every time he moved it will fire.  Patient apparently has fallen and has been weak.  Unable to take all his cardiac medications for about a week.  He was seen in the ER with heart rates in the 140s to 150s.  He appears to be in A. fib with RVR.  #1 rapid A. fib with possible ventricular tachycardia with elevated troponin  - Patient has pacemaker AICD which has been fired at least 50 times.   -  Follow cardiology Dr Bethanne Ginger recommendations we will initiate amiodarone drip.  -  continue metoprolol, lisinopril, and amiodarone drip -dude bolus of amiodarone drip in the morning -Dr. Ubaldo Glassing has discussed with electrophysiologist Dr. Caryl Comes who will see patient tomorrow. -Continue eliquis  #2 essential hypertension: Continue blood  pressure control.  #3 hyperlipidemia: Continue statin  #4 acute renal failure: Appears like acute on chronic kidney failure.  Probably prerenal.  Continue treatment.  #5 elevated troponin: Most likely as a result of tachycardia with severe systolic dysfunction.  Continue monitoring.  #6 chronic systolic dysfunction/CHF: Patient has EF of 25 to 30%.  He does not have evidence of fluid overload.  No overt pedal edema.  Continue home regimen   DVT prophylaxis: Apixaban Code Status: Full code Family Communication:  son in the ER Disposition Plan: Home Consults called: Cardiology Dr. Ubaldo Glassing Admission status: Inpatient  Severity of Illness: The appropriate patient status for this patient is INPATIENT. "           The patient's presenting symptoms include Palpitation. With frequent firing of AICD. "           The worrisome physical exam findings include tachycardia. "           The initial radiographic and laboratory data are worrisome because of increased troponin. "           The chronic co-morbidities include systolic dysfunction CHF.  TOTAL TIME TAKING CARE OF THIS PATIENT: *five* minutes.  >50% time spent on counselling and coordination of care  Note: This dictation was prepared with Dragon dictation along with smaller phrase technology. Any transcriptional errors that result from this process are unintentional.  Fritzi Mandes M.D    [facility]   CC: Primary care physician; Matthew Zamora, MDPatient ID: Matthew Zamora, male   DOB: [birth year], 79 y.o.   MRN: [id number]

## 2019-10-26 NOTE — ED Notes (Signed)
Pt requesting hospital bed at this time.

## 2019-10-26 NOTE — ED Notes (Signed)
Pt awake and alert with family at bedside. Pt denies any complaints at this time. Pt given ginger ale at this time. Pt in NAD. Pt respirations equal and unlabored. Pt NSR on monitor with HR 70-75. Pt denies any further needs.

## 2019-10-26 NOTE — ED Notes (Signed)
Meal tray placed at bedside.

## 2019-10-27 ENCOUNTER — Inpatient Hospital Stay: Payer: Medicare Other

## 2019-10-27 ENCOUNTER — Other Ambulatory Visit: Payer: Self-pay

## 2019-10-27 DIAGNOSIS — Z4502 Encounter for adjustment and management of automatic implantable cardiac defibrillator: Secondary | ICD-10-CM

## 2019-10-27 DIAGNOSIS — I4891 Unspecified atrial fibrillation: Secondary | ICD-10-CM

## 2019-10-27 LAB — MRSA PCR SCREENING: MRSA by PCR: POSITIVE — AB

## 2019-10-27 LAB — TSH: TSH: 0.446 u[IU]/mL (ref 0.350–4.500)

## 2019-10-27 LAB — MAGNESIUM: Magnesium: 2.3 mg/dL (ref 1.7–2.4)

## 2019-10-27 MED ORDER — FUROSEMIDE 10 MG/ML IJ SOLN
40.0000 mg | Freq: Once | INTRAMUSCULAR | Status: AC
  Administered 2019-10-27: 16:00:00 40 mg via INTRAVENOUS
  Filled 2019-10-27: qty 4

## 2019-10-27 MED ORDER — TECHNETIUM TC 99M DIETHYLENETRIAME-PENTAACETIC ACID
30.0000 | Freq: Once | INTRAVENOUS | Status: AC | PRN
  Administered 2019-10-27: 32.77 via INTRAVENOUS

## 2019-10-27 MED ORDER — IPRATROPIUM-ALBUTEROL 0.5-2.5 (3) MG/3ML IN SOLN
3.0000 mL | Freq: Four times a day (QID) | RESPIRATORY_TRACT | Status: DC
  Administered 2019-10-28: 3 mL via RESPIRATORY_TRACT
  Filled 2019-10-27 (×3): qty 3

## 2019-10-27 MED ORDER — TECHNETIUM TO 99M ALBUMIN AGGREGATED
4.0000 | Freq: Once | INTRAVENOUS | Status: AC | PRN
  Administered 2019-10-27: 4.41 via INTRAVENOUS

## 2019-10-27 NOTE — Progress Notes (Signed)
[place] visited pt. while rounding on ICU; pt. sitting up in bed using his cellphone; pt. seemed preoccupied with contacting his son, reportedly to come to take him home;  [facility] excused himself to allow for phone call.  [place] learned from RN pt. is not appropriate for discharge at this time; [facility] will follow up as needed.    10/27/19 1700  Clinical Encounter Type  Visited With Patient  Visit Type Initial;Social support  Referral From Other (Comment) (Routine Rounding)

## 2019-10-27 NOTE — Progress Notes (Signed)
Patient Name: Matthew Zamora Date of Encounter: 10/27/2019  Hospital Problem List     Principal Problem:   Tachycardia Active Problems:   Paroxysmal atrial fibrillation ([name])   Essential hypertension   Hyperlipidemia   Acute on chronic combined systolic and diastolic CHF (congestive heart failure) [facility])    Patient Profile      79 y.o. male with history of coronary artery diseases/p PCI x3 to RCA, an abdominal aortic aneurysm, hypertension, HFrEF with ejection fraction in the 20 to 25% range s/p AICD which is followed at [facility], chronic kidney disease, chronic atrial fibrillation and anemia who presents fora follow up visit. He was admitted to [facility] on 09/17/18 with acute on chronic respiratory failure, sepsis, and acute on chronic systolic heart failure. Was discharged and then presented to [facility] on 09/27/18 and underwent cardiac catheterization on 09/28/18 which revealed patent RCA stents with mild in-stent stenosis and severe ostial 80-90% stenosis of PDA branch, stable from previous cath.There was severe ostial 80-90% stenosis of a medium caliber dual PDA branch which was jailed by previous stent. The appearance is stable compared to previous PCI result. There was diffuse ostial/proximal LCx stenosis (30-40%) with a 40-50% distal LCx/OM stenosis. The LAD had diffuse, mild-moderate calcification.  He has been doing fairly well with regard to heart failure, rhythm etc. on metoprolol succinate 100 mg daily.  On day of presentation he noted that he was having repeated shocks from his AICD.  On presentation to the emergency room interrogation of the device revealed that he had appropriate shocks for ventricular tachycardia but also had multiple shocks for A. fib with RVR.  Subjective   Converted to normal sinus rhythm.  No further shocks.  VQ scan results pending  Inpatient Medications    . apixaban  5 mg Oral Q12H  . atorvastatin  40 mg Oral Daily  . Chlorhexidine Gluconate Cloth  6 each  Topical Daily  . clopidogrel  75 mg Oral Daily  . furosemide  40 mg Oral Daily  . gabapentin  300 mg Oral TID  . lipase/protease/amylase  24,000 Units Oral BID  . lisinopril  5 mg Oral Daily  . magnesium chloride  1 tablet Oral Daily  . metoprolol succinate  100 mg Oral Daily  . multivitamin with minerals  1 tablet Oral Daily  . pantoprazole  40 mg Oral Daily    Vital Signs    Vitals:   10/27/19 0600 10/27/19 0800 10/27/19 0900 10/27/19 1000  BP: 114/69 131/68 125/64 (!) 98/53  Pulse: (!) 103 96 87 85  Resp: (!) 28 (!) 28 18 (!) 23  Temp:   99.1 F (37.3 C)   TempSrc:   Oral   SpO2: 92% 92% (!) 89% 90%  Weight:      Height:        Intake/Output Summary (Last 24 hours) at 10/27/2019 1356 Last data filed at 10/27/2019 0935 Gross per 24 hour  Intake 540.83 ml  Output 350 ml  Net 190.83 ml   Filed Weights   10/25/19 2108 10/26/19 2317  Weight: 77.1 kg 78.6 kg    Physical Exam    GEN: Well nourished, well developed, in no acute distress.  HEENT: normal.  Neck: Supple, no JVD, carotid bruits, or masses. Cardiac: RRR, no murmurs, rubs, or gallops. No clubbing, cyanosis, edema.  Radials/DP/PT 2+ and equal bilaterally.  Respiratory:  Respirations regular and unlabored, clear to auscultation bilaterally. GI: Soft, nontender, nondistended, BS + x 4. MS: no deformity or  atrophy. Skin: warm and dry, no rash. Neuro:  Strength and sensation are intact. Psych: Normal affect.  Labs    CBC Recent Labs    10/25/19 2110 10/26/19 0420  WBC 6.7 8.1  NEUTROABS 6.1  --   HGB 11.0* 10.4*  HCT 33.3* 31.9*  MCV 92.0 92.2  PLT 180 219   Basic Metabolic Panel Recent Labs    10/25/19 2110 10/26/19 0420 10/27/19 0550  NA 137 138  --   K 3.4* 4.2  --   CL 103 103  --   CO2 21* 24  --   GLUCOSE 199* 132*  --   BUN 32* 37*  --   CREATININE 2.39* 2.35*  --   CALCIUM 9.4 9.0  --   MG  --   --  2.3   Liver Function Tests No results for input(s): AST, ALT, ALKPHOS, BILITOT,  PROT, ALBUMIN in the last 72 hours. No results for input(s): LIPASE, AMYLASE in the last 72 hours. Cardiac Enzymes No results for input(s): CKTOTAL, CKMB, CKMBINDEX, TROPONINI in the last 72 hours. BNP No results for input(s): BNP in the last 72 hours. D-Dimer No results for input(s): DDIMER in the last 72 hours. Hemoglobin A1C No results for input(s): HGBA1C in the last 72 hours. Fasting Lipid Panel Recent Labs    10/26/19 0420  CHOL 139  HDL 34*  LDLCALC 88  TRIG 87  CHOLHDL 4.1   Thyroid Function Tests Recent Labs    10/26/19 1942  TSH 0.446    Telemetry    Atrial fibrillation converted to sinus rhythm  ECG    Atrial fibrillation with rapid ventricular response  Radiology    No results found.  Assessment & Plan    1.  A. fib/VT --Patient has received AICD treatments.  Review of interrogation of his device showed that these were for ventricular tachycardia as well as for A. fib with RVR.  Patient is on metoprolol succinate at 100 mg as an outpatient anticoagulated with Eliquis.  He reports compliance with his medications.  He denies missing any meds or ingesting any new substances.  He currently is hemodynamically stable but in A. fib with RVR.  Appreciate input from Dr. Adam Phenix from electrophysiology.    Has converted to normal sinus rhythm with increased dosing of amiodarone.  We will continue with amiodarone drip.  We will also continue with metoprolol succinate at 100 mg daily.  Continue with Eliquis for anticoagulation.  TSH is normal.  Ventilation/perfusion lung scan results is pending.  2.  Elevated troponin -[place] secondary to RVR and cardioversions.  Does not appear to be having an acute coronary event.  We will continue as per above.3.  CKD-acute on chronic renal insufficiency.  Will closely follow.  4.  CAD-status post coronary PCI.  Does not appear to be having an acute coronary event at present.  5.  HFr EF  -We will continue to follow for  evidence of acute heart failure and treat appropriately.  We will continue with metoprolol succinate at 100 mg daily.  Also continue with lisinopril at 5 mg daily and furosemide 40 mg p.o. daily.   Signed, Javier Docker [place] Ament MD 10/27/2019, 1:56 PM  Pager: [phone]

## 2019-10-27 NOTE — Progress Notes (Signed)
Patient ID: Matthew Zamora, male   DOB: 1941/02/23, 79 y.o.   MRN: [id number]  Dr Ubaldo Glassing has initiated transfer to [facility] per patient and son request.

## 2019-10-27 NOTE — Progress Notes (Addendum)
[place] at [place] NAME: Charley Miske    MR#:  [id number]  DATE OF BIRTH:  1940-07-08  SUBJECTIVE:   Patient is a little bit sleepy. Wakes up on verbal commands. Denies any palpitations. Per RN no more AICD firing.  Mild shortness of breath  REVIEW OF SYSTEMS:   Review of Systems  Constitutional: Negative for chills, fever and weight loss.  HENT: Negative for ear discharge, ear pain and nosebleeds.   Eyes: Negative for blurred vision, pain and discharge.  Respiratory: Negative for sputum production, shortness of breath, wheezing and stridor.   Cardiovascular: Negative for orthopnea and PND.  Gastrointestinal: Negative for abdominal pain, diarrhea, nausea and vomiting.  Genitourinary: Negative for frequency and urgency.  Musculoskeletal: Positive for falls. Negative for back pain and joint pain.  Neurological: Positive for weakness. Negative for sensory change, speech change and focal weakness.  Psychiatric/Behavioral: Negative for depression and hallucinations. The patient is not nervous/anxious.    Tolerating Diet:yes Tolerating PT: pending  DRUG ALLERGIES:  No Known Allergies  VITALS:  Blood pressure (!) 98/53, pulse 85, temperature 99.1 F (37.3 C), temperature source Oral, resp. rate (!) 23, height 5\' 9"  (1.753 m), weight 78.6 kg, SpO2 90 %.  PHYSICAL EXAMINATION:   Physical Exam  GENERAL:  79 y.o.-year-old patient lying in the bed with no acute distress.  EYES: Pupils equal, round, reactive to light and accommodation. No scleral icterus.   HEENT: Head atraumatic, normocephalic. Oropharynx and nasopharynx clear.  NECK:  Supple, no jugular venous distention. No thyroid enlargement, no tenderness.  LUNGS: Normal breath sounds bilaterally, no wheezing, rales, rhonchi. No use of accessory muscles of respiration.  CARDIOVASCULAR: S1, S2 normal. No murmurs, rubs, or gallops.  ABDOMEN: Soft, nontender, nondistended. Bowel  sounds present. No organomegaly or mass.  EXTREMITIES: No cyanosis, clubbing or edema b/l.    NEUROLOGIC: Cranial nerves II through XII are intact. No focal Motor or sensory deficits b/l.   PSYCHIATRIC:  patient is alert and oriented x 3.  SKIN: No obvious rash, lesion, or ulcer.   LABORATORY PANEL:  CBC Recent Labs  Lab 10/26/19 0420  WBC 8.1  HGB 10.4*  HCT 31.9*  PLT 174    Chemistries  Recent Labs  Lab 10/26/19 0420 10/27/19 0550  NA 138  --   K 4.2  --   CL 103  --   CO2 24  --   GLUCOSE 132*  --   BUN 37*  --   CREATININE 2.35*  --   CALCIUM 9.0  --   MG  --  2.3   Cardiac Enzymes No results for input(s): TROPONINI in the last 168 hours. RADIOLOGY:  NM Pulmonary Perf and Vent  Result Date: 10/27/2019 CLINICAL DATA:  Atrial fibrillation with shortness of breath EXAM: NUCLEAR MEDICINE VENTILATION - PERFUSION LUNG SCAN VIEWS: Anterior, posterior, left lateral, right lateral, RPO, LPO, RAO, LAO-ventilation and perfusion RADIOPHARMACEUTICALS:  32.77 mCi of Tc-96m DTPA aerosol inhalation and 4.41 mCi Tc59m MAA IV COMPARISON:  Chest radiograph Oct 27, 2019 FINDINGS: Ventilation: There is relative photopenia throughout the lateral aspect of the right lung with involvement of several segments in the upper and lower lobes on the right. Scattered subsegmental defects noted on the left. Perfusion: There is a matching defect involving a portion of the lateral aspect of the right lung with involvement of several segments in the upper and lower lobes in a matching distribution. There are matching subsegmental defects on the  left. There is no appreciable ventilation/perfusion mismatch. IMPRESSION: Fairly extensive matching ventilation perfusion defects on the right without upper associated chest radiographic abnormality in this area. No appreciable ventilation/perfusion mismatch. Per PIOPED II guidelines, this study falls into an intermediate category for pulmonary embolus. Electronically  Signed   By: Lowella Grip III M.D.   On: 10/27/2019 14:09   DG Chest Port 1 View  Result Date: 10/27/2019 CLINICAL DATA:  79 year old male with shortness of breath. EXAM: PORTABLE CHEST 1 VIEW COMPARISON:  Chest radiograph dated 09/17/2019. FINDINGS: There is stable cardiomegaly with vascular congestion. Overall interval improvement in the degree of congestion since the prior radiograph. No edema. No focal consolidation, pleural effusion, pneumothorax. Left pectoral AICD device. No acute osseous pathology. IMPRESSION: Cardiomegaly with vascular congestion. No focal consolidation. Electronically Signed   By: Anner Crete M.D.   On: 10/27/2019 14:17   ASSESSMENT AND PLAN:   SIDI DZIKOWSKI is a 79 y.o. 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  Patient has AICD in place.  He fell it fired several times.  According to patient every time he moved it will fire.  Patient apparently has fallen and has been weak.  Unable to take all his cardiac medications for about a week.  He was seen in the ER with heart rates in the 140s to 150s.  He appears to be in A. fib with RVR.  #1 rapid A. fib with possible ventricular tachycardia with elevated troponin  - Patient has pacemaker AICD which has been fired at least 50 times.   -Follow cardiology Dr Bethanne Ginger recommendations we will initiate amiodarone drip.  -  continue metoprolol, lisinopril, and amiodarone drip -Dr. Ubaldo Glassing has discussed with electrophysiologist Dr. Caryl Comes who will see patient tomorrow. (Thursday) -Continue eliquis  #2 essential hypertension: Continue blood pressure control.  #3 hyperlipidemia: Continue statin  #4 acute renal failure: Appears like acute on chronic kidney failure.   Probably prerenal.  Continue treatment.  #5 elevated troponin: Most likely as a result of tachycardia with severe systolic dysfunction.  Continue monitoring.  #6 Acute on chronic systolic dysfunction/CHF: Patient has EF of 25 to 30%.   No overt pedal edema.   -chest x-ray shows mild pulmonary vascular congestion in with oxygen saturations dropping in the 80s I will give extra dose of Lasix 40 mg x1 IV today -v/q scan intermediate for PE--already on eliquis  DVT prophylaxis: Apixaban Code Status: Full code Family Communication:  son in the ER. Dr. Ubaldo Glassing will talk with son a bit later today. Disposition Plan: Home Consults called: Cardiology Dr. Ubaldo Glassing Admission status: Inpatient  Severity of Illness: The appropriate patient status for this patient is INPATIENT. "           The patient's presenting symptoms include Palpitation. With frequent firing of AICD.Need EP to see pt "           The initial radiographic and laboratory data are worrisome because of increased troponin. "           The chronic co-morbidities include systolic dysfunction CHF.  TOTAL TIME TAKING CARE OF THIS PATIENT: *f25e* minutes.  >50% time spent on counselling and coordination of care  Note: This dictation was prepared with Dragon dictation along with smaller phrase technology. Any transcriptional errors that result from this process  are unintentional.  Fritzi Mandes M.D    [facility]   CC: Primary care physician; [place] Pink, MDPatient ID: [name], male   DOB: [birth year], 79 y.o.   MRN: [id number]

## 2019-10-27 NOTE — Consult Note (Signed)
error 

## 2019-10-28 ENCOUNTER — Inpatient Hospital Stay: Payer: Medicare Other

## 2019-10-28 DIAGNOSIS — I130 Hypertensive heart and chronic kidney disease with heart failure and stage 1 through stage 4 chronic kidney disease, or unspecified chronic kidney disease: Principal | ICD-10-CM

## 2019-10-28 DIAGNOSIS — R000 Tachycardia, unspecified: Secondary | ICD-10-CM

## 2019-10-28 DIAGNOSIS — Z515 Encounter for palliative care: Secondary | ICD-10-CM

## 2019-10-28 DIAGNOSIS — I5043 Acute on chronic combined systolic (congestive) and diastolic (congestive) heart failure: Secondary | ICD-10-CM

## 2019-10-28 DIAGNOSIS — I1310 Hypertensive heart and chronic kidney disease without heart failure, with stage 1 through stage 4 chronic kidney disease, or unspecified chronic kidney disease: Secondary | ICD-10-CM

## 2019-10-28 DIAGNOSIS — Z7189 Other specified counseling: Secondary | ICD-10-CM

## 2019-10-28 LAB — BASIC METABOLIC PANEL
Anion gap: 13 (ref 5–15)
BUN: 64 mg/dL — ABNORMAL HIGH (ref 8–23)
CO2: 24 mmol/L (ref 22–32)
Calcium: 8.4 mg/dL — ABNORMAL LOW (ref 8.9–10.3)
Chloride: 92 mmol/L — ABNORMAL LOW (ref 98–111)
Creatinine, Ser: 5.88 mg/dL — ABNORMAL HIGH (ref 0.61–1.24)
GFR calc Af Amer: 10 mL/min — ABNORMAL LOW (ref >60)
GFR calc non Af Amer: 8 mL/min — ABNORMAL LOW (ref >60)
Glucose, Bld: 172 mg/dL — ABNORMAL HIGH (ref 70–99)
Potassium: 3.9 mmol/L (ref 3.5–5.1)
Sodium: 129 mmol/L — ABNORMAL LOW (ref 135–145)

## 2019-10-28 LAB — BLOOD GAS, ARTERIAL
Acid-base deficit: 2.7 mmol/L — ABNORMAL HIGH (ref 0.0–2.0)
Bicarbonate: 22.6 mmol/L (ref 20.0–28.0)
FIO2: 0.34
O2 Saturation: 77.6 %
Patient temperature: 37
pCO2 arterial: 40 mmHg (ref 32.0–48.0)
pH, Arterial: 7.36 (ref 7.350–7.450)
pO2, Arterial: 44 mmHg — ABNORMAL LOW (ref 83.0–108.0)

## 2019-10-28 LAB — GLUCOSE, CAPILLARY: Glucose-Capillary: 146 mg/dL — ABNORMAL HIGH (ref 70–99)

## 2019-10-28 LAB — CREATININE, URINE, RANDOM: Creatinine, Urine: 238 mg/dL

## 2019-10-28 LAB — CBC
HCT: 29.2 % — ABNORMAL LOW (ref 39.0–52.0)
Hemoglobin: 9.6 g/dL — ABNORMAL LOW (ref 13.0–17.0)
MCH: 29.9 pg (ref 26.0–34.0)
MCHC: 32.9 g/dL (ref 30.0–36.0)
MCV: 91 fL (ref 80.0–100.0)
Platelets: 148 10*3/uL — ABNORMAL LOW (ref 150–400)
RBC: 3.21 MIL/uL — ABNORMAL LOW (ref 4.22–5.81)
RDW: 13.9 % (ref 11.5–15.5)
WBC: 5.9 10*3/uL (ref 4.0–10.5)
nRBC: 0 % (ref 0.0–0.2)

## 2019-10-28 LAB — PHOSPHORUS: Phosphorus: 3.7 mg/dL (ref 2.5–4.6)

## 2019-10-28 LAB — SODIUM, URINE, RANDOM: Sodium, Ur: 19 mmol/L

## 2019-10-28 MED ORDER — FUROSEMIDE 10 MG/ML IJ SOLN
40.0000 mg | Freq: Two times a day (BID) | INTRAMUSCULAR | Status: DC
  Administered 2019-10-28 (×2): 40 mg via INTRAVENOUS
  Filled 2019-10-28 (×2): qty 4

## 2019-10-28 MED ORDER — SODIUM CHLORIDE 0.9% FLUSH
10.0000 mL | Freq: Two times a day (BID) | INTRAVENOUS | Status: DC
  Administered 2019-10-28: 10 mL

## 2019-10-28 MED ORDER — NOREPINEPHRINE 4 MG/250ML-% IV SOLN
2.0000 ug/min | INTRAVENOUS | Status: DC
  Administered 2019-10-28: 5 ug/min via INTRAVENOUS
  Administered 2019-10-28: 12 ug/min via INTRAVENOUS
  Filled 2019-10-28: qty 250

## 2019-10-28 MED ORDER — HEPARIN SOD (PORK) LOCK FLUSH 10 UNIT/ML IV SOLN
20.0000 [IU] | INTRAVENOUS | Status: DC | PRN
  Administered 2019-10-28: 20 [IU]
  Filled 2019-10-28 (×2): qty 2

## 2019-10-28 MED ORDER — IPRATROPIUM-ALBUTEROL 0.5-2.5 (3) MG/3ML IN SOLN
3.0000 mL | Freq: Three times a day (TID) | RESPIRATORY_TRACT | Status: DC
  Administered 2019-10-28 – 2019-10-29 (×2): 3 mL via RESPIRATORY_TRACT
  Filled 2019-10-28 (×3): qty 3

## 2019-10-28 MED ORDER — NOREPINEPHRINE 4 MG/250ML-% IV SOLN
0.0000 ug/min | INTRAVENOUS | Status: DC
  Administered 2019-10-28 – 2019-10-29 (×3): 12 ug/min via INTRAVENOUS
  Filled 2019-10-28 (×3): qty 250

## 2019-10-28 MED ORDER — HEPARIN SOD (PORK) LOCK FLUSH 10 UNIT/ML IV SOLN
20.0000 [IU] | Freq: Two times a day (BID) | INTRAVENOUS | Status: DC
  Filled 2019-10-28 (×2): qty 2

## 2019-10-28 MED ORDER — SODIUM CHLORIDE 0.9 % IV BOLUS
250.0000 mL | Freq: Once | INTRAVENOUS | Status: AC
  Administered 2019-10-28: 250 mL via INTRAVENOUS

## 2019-10-28 MED ORDER — SODIUM CHLORIDE 0.9 % IV SOLN: 250.0000 mL | INTRAVENOUS | Status: DC

## 2019-10-28 MED ORDER — AMIODARONE HCL 200 MG PO TABS
400.0000 mg | ORAL_TABLET | Freq: Two times a day (BID) | ORAL | Status: DC
  Administered 2019-10-28 (×2): 400 mg via ORAL
  Filled 2019-10-28 (×2): qty 2

## 2019-10-28 NOTE — Progress Notes (Signed)
Pt  converted from Afib with RVR to NSR, had one event of burst of SVT and pt was asystematic and did not sustain. Pt still on  Amio gtt at that time. DR Ubaldo Glassing was notified this morning about pt low BP, received an order to DC Amio gtt and convert pt to PO Amio. Also stated to hold this Am metoprolol and DC Lisinopril. Pt had Tax of 10.0 last night,ttylenol given and temp now 98.5.

## 2019-10-28 NOTE — Progress Notes (Signed)
Patient converted back into a-fib RVR.  Dr. Ubaldo Glassing made aware of changes in respiratory changes and rhyme change.  Orders placed for IV lasix, chest x-ray, and to give PO dose of amiodarone early.

## 2019-10-28 NOTE — Progress Notes (Signed)
Patient with bilateral expiratory wheezes.  02 sats 88-89% on 2L nasal cannula.  Breathing treatment to be given and 02 increased to 3.5L.

## 2019-10-28 NOTE — Progress Notes (Signed)
Dr. Ubaldo Glassing called and notified of low blood pressure.  Order obtained to transfer patient from stepdown to ICU status.  Dr. Mortimer Fries notified of blood pressure and order obtained for levophed.

## 2019-10-28 NOTE — Progress Notes (Signed)
Triaylsis placed to right IJ by Dr. Mortimer Fries.  Nephrology has been consulted for renal failure.

## 2019-10-28 NOTE — Progress Notes (Signed)
[place] at [place] NAME: Matthew Zamora    MR#:  [id number]  DATE OF BIRTH:  [birth year]  SUBJECTIVE:   Patient is a little bit sleepy. Wakes up on verbal commands.  Feeling shortness of breath with chest tightness. Has been on breathing treatment. His blood pressure dropped earlier and currently on IV levophed drip.   REVIEW OF SYSTEMS:   Review of Systems  Constitutional: Negative for chills, fever and weight loss.  HENT: Negative for ear discharge, ear pain and nosebleeds.   Eyes: Negative for blurred vision, pain and discharge.  Respiratory: Negative for sputum production, shortness of breath, wheezing and stridor.   Cardiovascular: Negative for orthopnea and PND.  Gastrointestinal: Negative for abdominal pain, diarrhea, nausea and vomiting.  Genitourinary: Negative for frequency and urgency.  Musculoskeletal: Positive for falls. Negative for back pain and joint pain.  Neurological: Positive for weakness. Negative for sensory change, speech change and focal weakness.  Psychiatric/Behavioral: Negative for depression and hallucinations. The patient is not nervous/anxious.    Tolerating Diet:yes Tolerating PT: pending  DRUG ALLERGIES:  No Known Allergies  VITALS:  Blood pressure (!) 87/55, pulse 96, temperature 98.4 F (36.9 C), temperature source Oral, resp. rate 19, height 5\' 9"  (1.753 m), weight 78.6 kg, SpO2 96 %.  PHYSICAL EXAMINATION:   Physical Exam  GENERAL:  79 y.o.-year-old patient lying in the bed with no acute distress. Critically ill. EYES: Pupils equal, round, reactive to light and accommodation. No scleral icterus.   HEENT: Head atraumatic, normocephalic. Oropharynx and nasopharynx clear.    LUNGS: decreased breath sounds bilaterally, no wheezing, rales, rhonchi. No use of accessory muscles of respiration.  CARDIOVASCULAR: S1, S2 normal. No murmurs, rubs, or gallops. Tachycardia a fib ABDOMEN: Soft,  nontender, nondistended. Bowel sounds present. No organomegaly or mass.  EXTREMITIES: No cyanosis, clubbing or edema b/l.    NEUROLOGIC: moves all extremities well. PSYCHIATRIC:  patient is alert but sleepy SKIN: No obvious rash, lesion, or ulcer.   LABORATORY PANEL:  CBC Recent Labs  Lab 10/28/19 0941  WBC 5.9  HGB 9.6*  HCT 29.2*  PLT 148*    Chemistries  Recent Labs  Lab 10/26/19 0420 10/27/19 0550 10/28/19 0941  NA   < >  --  129*  K   < >  --  3.9  CL   < >  --  92*  CO2   < >  --  24  GLUCOSE   < >  --  172*  BUN   < >  --  64*  CREATININE   < >  --  5.88*  CALCIUM   < >  --  8.4*  MG  --  2.3  --    < > = values in this interval not displayed.   Cardiac Enzymes No results for input(s): TROPONINI in the last 168 hours. RADIOLOGY:  NM Pulmonary Perf and Vent  Result Date: 10/27/2019 CLINICAL DATA:  Atrial fibrillation with shortness of breath EXAM: NUCLEAR MEDICINE VENTILATION - PERFUSION LUNG SCAN VIEWS: Anterior, posterior, left lateral, right lateral, RPO, LPO, RAO, LAO-ventilation and perfusion RADIOPHARMACEUTICALS:  32.77 mCi of Tc-20m DTPA aerosol inhalation and 4.41 mCi Tc72m MAA IV COMPARISON:  Chest radiograph Oct 27, 2019 FINDINGS: Ventilation: There is relative photopenia throughout the lateral aspect of the right lung with involvement of several segments in the upper and lower lobes on the right. Scattered subsegmental defects noted on the left. Perfusion: There is a  matching defect involving a portion of the lateral aspect of the right lung with involvement of several segments in the upper and lower lobes in a matching distribution. There are matching subsegmental defects on the left. There is no appreciable ventilation/perfusion mismatch. IMPRESSION: Fairly extensive matching ventilation perfusion defects on the right without upper associated chest radiographic abnormality in this area. No appreciable ventilation/perfusion mismatch. Per PIOPED II guidelines,  this study falls into an intermediate category for pulmonary embolus. Electronically Signed   By: Lowella Grip III M.D.   On: 10/27/2019 14:09   DG Chest Port 1 View  Result Date: 10/27/2019 CLINICAL DATA:  79 year old male with shortness of breath. EXAM: PORTABLE CHEST 1 VIEW COMPARISON:  Chest radiograph dated 09/17/2019. FINDINGS: There is stable cardiomegaly with vascular congestion. Overall interval improvement in the degree of congestion since the prior radiograph. No edema. No focal consolidation, pleural effusion, pneumothorax. Left pectoral AICD device. No acute osseous pathology. IMPRESSION: Cardiomegaly with vascular congestion. No focal consolidation. Electronically Signed   By: Anner Crete M.D.   On: 10/27/2019 14:17   ASSESSMENT AND PLAN:   Matthew Zamora is a 79 y.o. 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  Patient has AICD in place.  He fell it fired several times.  According to patient every time he moved it will fire.  Patient apparently has fallen and has been weak.  Unable to take all his cardiac medications for about a week.  He was seen in the ER with heart rates in the 140s to 150s.  He appears to be in A. fib with RVR.  # rapid A. fib with possible ventricular tachycardia with elevated troponin and multiple AICD firing - Patient has pacemaker AICD which has been fired at least 50 times.   -Follow cardiology Dr Bethanne Ginger recommendations patient was on IV amiodarone drip now switch to oral -  hold metoprolol, lisinopril-- due to elevated creatinine -Dr. Ubaldo Glassing has discussed with electrophysiologist Dr. Caryl Comes who will see patient today  -Continue eliquis  #Cardiogenic shock/hypotension/acute on chronic renal  failure? Cardio renal -hold beta-blockers and lisinopril -5/13>>IV Levophed gtt started -Dr. Ubaldo Glassing has discussed with [facility] cardiology doctors Stephani Police-- who has accepted the patient. Awaiting bed opening up at [facility].  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   No overt pedal edema.   -chest x-ray shows mild pulmonary vascular congestion in with oxygen saturations dropping in the 80s  -Lasix 40 mg x1 IV today -v/q scan intermediate for PE--already on eliquis   Patient is critically ill. This was discussed with patient's son Matthew Zamora on the phone. I did also mention about [facility] transfer when bed opens up. He did voice understanding.  Palliative care to see patient.  DVT prophylaxis: Apixaban Code Status: Full code Family Communication:  Matthew Crumble  Zamora on the phone Disposition Plan:  to be determined Consults called: Cardiology Dr. Ubaldo Glassing Admission status: Inpatient patient is currently critically ill with cardiogenic shock, cardiac renal syndrome with atrial fibrillation and congestive heart failure acute on chronic systolic. He is in the process for getting transfer to [facility] when beds open up.[name]   TOTAL critical  TIME TAKING CARE OF THIS PATIENT:35* minutes.  >50% time spent on counselling and coordination of care  Note: This dictation was prepared with Dragon dictation along with smaller phrase technology. Any transcriptional errors  that result from this process are unintentional.  Fritzi Mandes M.D    [facility]   CC: Primary care physician; [place] Pink, MDPatient ID: Matthew Zamora, male   DOB: 13-Jan-1941, 79 y.o.   MRN: [id number]

## 2019-10-28 NOTE — Progress Notes (Signed)
Per RN, patient's had episodes of hypotension with MAP<60 on Amiodarone gtt for afib with RVR. He remains asymptomatic despite low BP. He was scheduled to have Metoprolol and Lisinopril this am however given low BP would likely need medication held or adjusted. Primary RN contacted cardiology who recommends d/c Amiodarone and hold metoprolol and Lisinopril. He will continue on Amiodarone PO. Cardiology will continue to follow.

## 2019-10-28 NOTE — Consult Note (Signed)
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   He was admitted to [facility] on 09/17/18 with acute on chronic respiratory failure, acute on chronic systolic heart failure. Was discharged and then presented to [facility] on 09/27/18 and underwent cardiac catheterization on 09/28/18 which revealed patent RCA stents with mild in-stent stenosis and severe ostial 80-90% stenosis of PDA branch, stable from previous cath.   He had been doing fairly well with regard to heart failure, rhythm etc. on metoprolol succinate 100 mg daily. On day of presentation he noted that he was having repeated shocks from his AICD.   On presentation to the emergency room interrogation of the device revealed that he had appropriate shocks for ventricular tachycardia but also had multiple shocks for A. fib with RVR.   Echo done during afib with rvr was read as ef of 25-30%. Converted to nsr with amiodarone drip. Now back in afib with rvr and evidence clinically of Systolic CHF. Has had meds held due to hypotension.   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  has a past surgical history that includes Prostate surgery; Insertion of ICD; Cataract extraction; Tonsillectomy; Colonoscopy (10/06/2014, 09/18/2004, 03/14/2009); Flexible sigmoidoscopy (08/26/1990); Esophagogastroduodenoscopy (10/06/2014, 03/18/2001, 03/14/2009); and Esophagogastroduodenoscopy (egd) with propofol (N/A, 05/07/2018). Prior to Admission medications   Medication Sig Start Date End Date Taking? Authorizing Provider  albuterol (VENTOLIN HFA) 108 (90 Base) MCG/ACT inhaler Inhale into the lungs as needed.  09/24/18  Yes [provider]  atorvastatin (LIPITOR) 40 MG tablet Take 40 mg by mouth daily.   Yes [provider]  clopidogrel (PLAVIX) 75 MG tablet Take 75 mg by mouth daily. 04/15/18  Yes [provider]  ELIQUIS 5 MG TABS tablet Take 5 mg by mouth every 12 (twelve) hours. 10/21/18  Yes [provider]  furosemide (LASIX) 40 MG tablet Take 40 mg by mouth daily.    Yes [provider]  gabapentin (NEURONTIN) 300 MG capsule 300 mg. Take 2 capsules(600mg ) by mouth nightly 12/17/18  Yes [provider]  lisinopril (PRINIVIL,ZESTRIL) 5 MG tablet Take 5 mg by mouth daily.   Yes [provider]  magnesium (MAGTAB) 84 MG (7MEQ) TBCR SR tablet Take 84 mg by mouth daily.   Yes [provider]  Melatonin 1 MG TABS Take 1 tablet by mouth at bedtime as needed. 12/27/13  Yes [provider]  metoprolol succinate (TOPROL-XL) 100 MG 24 hr tablet Take 100 mg by mouth daily. Take 1 tablet by mouth daily. Take with or immediately following a meal.   Yes [provider]  Multiple Vitamins-Minerals (MULTIVITAMIN WITH MINERALS) tablet Take  1 tablet by mouth daily.   Yes [provider]  omeprazole (PRILOSEC) 20 MG capsule Take 20 mg by mouth  daily.   Yes [provider]  tadalafil (CIALIS) 20 MG tablet TAKE 1 TABLET BY MOUTH ONCE DAILY AS NEEDED FOR ERECTILE DYSFUNCTION 07/13/18  Yes [provider]  lipase/protease/amylase (CREON) 12000 units CPEP capsule Take 24,000 Units by mouth 2 (two) times daily at 10 am and 4 pm.    [provider]   No Known Allergies  FAMILY HISTORY:  family history includes Heart attack in his father and mother. SOCIAL HISTORY:  reports that he quit smoking about 20 years ago. His smoking use included cigarettes. He has a 38.00 pack-year smoking history. He has never used smokeless tobacco. He reports previous drug use. He reports that he does not drink alcohol.   Review of Systems:  Gen:  Weakness, +SOB HEENT: Denies blurred vision, double vision, ear pain, eye pain, hearing loss, nose bleeds, sore throat Cardiac:  No dizziness, chest pain or heaviness, chest tightness,edema, +JVD Resp:  +shortness of breath, Gi: Denies swallowing difficulty, stomach pain, nausea or vomiting, diarrhea, constipation, bowel incontinence Gu:  Denies bladder incontinence, burning urine Ext:   Denies Joint pain, stiffness or swelling Skin: Denies  skin rash, easy bruising or bleeding or hives Endoc:  Denies polyuria, polydipsia , polyphagia or weight change Psych:   Denies depression, insomnia or hallucinations  Other:  All other systems negative   Estimated body mass index is 25.59 kg/m as calculated from the following:   Height as of this encounter: 5\' 9"  (1.753 m).   Weight as of this encounter: 78.6 kg.    VITAL SIGNS: Temp:  [98.4 F (36.9 C)-100 F (37.8 C)] 98.4 F (36.9 C) (05/13 0800) Pulse Rate:  [62-115] 89 (05/13 1030) Resp:  [16-34] 20 (05/13 1030) BP: (57-111)/(37-78) 65/39 (05/13 1030) SpO2:  [72 %-100 %] 92 % (05/13 1030)   I/O last 3 completed shifts: In: 1312.3 [I.V.:1312.3] Out: 500 [Urine:500] No intake/output data recorded.   SpO2: 92 % O2 Flow Rate  (L/min): 3.5 L/min   Physical Examination:  GENERAL:critically ill appearing, +minimal resp distress HEAD: Normocephalic, atraumatic.  EYES: Pupils equal, round, reactive to light.  No scleral icterus.  MOUTH: Moist mucosal membrane. NECK: Supple. No JVD.  PULMONARY: +rhonchi, +crackles CARDIOVASCULAR: S1 and S2. Regular rate and rhythm. No murmurs, rubs, or gallops.  GASTROINTESTINAL: Soft, nontender, -distended.  Positive bowel sounds.  MUSCULOSKELETAL: + edema.  NEUROLOGIC: alert and awake SKIN:intact,warm,dry  I personally reviewed lab work that was obtained in last 24 hrs. CXR Independently reviewed-b/l interstitial infiltrates from pulm edema  MEDICATIONS: I have reviewed all medications and confirmed regimen as documented   CULTURE RESULTS   Recent Results (from the past 240 hour(s))  SARS Coronavirus 2 by RT PCR (hospital order, performed in [facility] hospital lab) Nasopharyngeal Nasopharyngeal Swab     Status: None   Collection Time: 10/26/19 10:52 PM   Specimen: Nasopharyngeal Swab  Result Value Ref Range Status   SARS Coronavirus 2 NEGATIVE NEGATIVE Final    Comment: (NOTE) SARS-CoV-2 target nucleic acids are NOT DETECTED. The SARS-CoV-2 RNA is generally detectable in upper and lower respiratory specimens during the acute phase of infection. The lowest concentration of SARS-CoV-2 viral copies this assay can detect is 250 copies / mL. A negative result does not preclude SARS-CoV-2 infection and should not be used as the sole basis for treatment or other patient management decisions.  A  negative result may occur with improper specimen collection / handling, submission of specimen other than nasopharyngeal swab, presence of viral mutation(s) within the areas targeted by this assay, and inadequate number of viral copies (<250 copies / mL). A negative result must be combined with clinical observations, patient history, and epidemiological information. Fact Sheet for  Patients:   StrictlyIdeas.no Fact Sheet for Healthcare Providers: BankingDealers.co.za This test is not yet approved or cleared  by the [place] FDA and has been authorized for detection and/or diagnosis of SARS-CoV-2 by FDA under an Emergency Use Authorization (EUA).  This EUA will remain in effect (meaning this test can be used) for the duration of the COVID-19 declaration under Section 564(b)(1) of the Act, 21 U.S.C. section 360bbb-3(b)(1), unless the authorization is terminated or revoked sooner. Performed at [facility], [address] [place], [place] [address]   MRSA PCR Screening     Status: Abnormal   Collection Time: 10/26/19 11:36 PM   Specimen: Nasopharyngeal  Result Value Ref Range Status   MRSA by PCR POSITIVE (A) NEGATIVE Final    Comment:        The GeneXpert MRSA Assay (FDA approved for NASAL specimens only), is one component of a comprehensive MRSA colonization surveillance program. It is not intended to diagnose MRSA infection nor to guide or monitor treatment for MRSA infections. RESULT CALLED TO, READ BACK BY AND VERIFIED WITH: [place] [phone] 10/27/19 HNM Performed at [facility], [address] [place],  [address]           IMAGING    NM Pulmonary Perf and Vent  Result Date: 10/27/2019 CLINICAL DATA:  Atrial fibrillation with shortness of breath EXAM: NUCLEAR MEDICINE VENTILATION - PERFUSION LUNG SCAN VIEWS: Anterior, posterior, left lateral, right lateral, RPO, LPO, RAO, LAO-ventilation and perfusion RADIOPHARMACEUTICALS:  32.77 mCi of Tc-69m DTPA aerosol inhalation and 4.41 mCi Tc20m MAA IV COMPARISON:  Chest radiograph Oct 27, 2019 FINDINGS: Ventilation: There is relative photopenia throughout the lateral aspect of the right lung with involvement of several segments in the upper and lower lobes on the right. Scattered subsegmental defects noted on the left.  Perfusion: There is a matching defect involving a portion of the lateral aspect of the right lung with involvement of several segments in the upper and lower lobes in a matching distribution. There are matching subsegmental defects on the left. There is no appreciable ventilation/perfusion mismatch. IMPRESSION: Fairly extensive matching ventilation perfusion defects on the right without upper associated chest radiographic abnormality in this area. No appreciable ventilation/perfusion mismatch. Per PIOPED II guidelines, this study falls into an intermediate category for pulmonary embolus. Electronically Signed   By: Lowella Grip III M.D.   On: 10/27/2019 14:09   DG Chest Port 1 View  Result Date: 10/27/2019 CLINICAL DATA:  79 year old male with shortness of breath. EXAM: PORTABLE CHEST 1 VIEW COMPARISON:  Chest radiograph dated 09/17/2019. FINDINGS: There is stable cardiomegaly with vascular congestion. Overall interval improvement in the degree of congestion since the prior radiograph. No edema. No focal consolidation, pleural effusion, pneumothorax. Left pectoral AICD device. No acute osseous pathology. IMPRESSION: Cardiomegaly with vascular congestion. No focal consolidation. Electronically Signed   By: Anner Crete M.D.   On: 10/27/2019 14:17     Nutrition Status:           ASSESSMENT AND PLAN SYNOPSIS   Severe ACUTE Hypoxic and Hypercapnic Respiratory Failure due to severe progressive systolic CHF now with cariogenic shock and NSTEMI High  risk for intubation and high risk for cardiac arrest  ACUTE SYSTOLIC CARDIAC FAILURE- EF 25% -oxygen as needed -Lasix as tolerated -follow up cardiac enzymes as indicated -follow up cardiology recs   ACUTE KIDNEY INJURY/Renal Failure -continue Foley Catheter-assess need -Avoid nephrotoxic agents -Follow urine output, BMP -Ensure adequate renal perfusion, optimize oxygenation -Renal dose medications    SHOCK-CARDIOGENIC -use  vasopressors to keep MAP>65 -follow ABG and LA   CARDIAC ICU monitoring   GI GI PROPHYLAXIS as indicated  NUTRITIONAL STATUS DIET-->as tolerated Constipation protocol as indicated   ENDO - will use ICU hypoglycemic\Hyperglycemia protocol if needed    ELECTROLYTES -follow labs as needed -replace as needed -pharmacy consultation and following    DVT/GI PRX ordered and assessed TRANSFUSIONS AS NEEDED MONITOR FSBS I Assessed the need for Labs I Assessed the need for Foley I Assessed the need for Central Venous Line Family Discussion when available I Assessed the need for Mobilization I made an Assessment of medications to be adjusted accordingly Safety Risk assessment Completed  CASE DISCUSSED IN MULTIDISCIPLINARY ROUNDS WITH ICU TEAM   Critical Care Time devoted to patient care services described in this note is 45 minutes.   Overall, patient is critically ill, prognosis is guarded.  Patient with Multiorgan failure and at high risk for cardiac arrest and death.   RECOMMEND PALLIATIVE CARE CONSULTATION  Corrin Parker, M.D.  [facility]  Medical Director [place] Director [facility] Cardio-Pulmonary Department

## 2019-10-28 NOTE — Progress Notes (Addendum)
Pt last voided around 2000 hour, pt denies any urge to void. No bladder distension noted, pt denies any discomfort or need to void now.

## 2019-10-28 NOTE — Progress Notes (Signed)
Pt is not wheezing like yesterday. He scored a 5 on the RT protocol assessment. His nebulizer treatments are being changed to TID. He does have a prn Albuterol order.

## 2019-10-28 NOTE — Progress Notes (Signed)
Patient has become hard to arouse.  Dr. Mortimer Fries notified.  ABG and blood glucose to be obtained.

## 2019-10-28 NOTE — Progress Notes (Signed)
Dr. Mortimer Fries asked to review blood work that resulted.  No orders received.

## 2019-10-28 NOTE — Progress Notes (Signed)
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 He was admitted to [facility] on 09/17/18 with acute on chronic respiratory failure, sepsis, and acute on chronic systolic heart failure. Was discharged and then presented to [facility] on 09/27/18 and underwent cardiac catheterization on 09/28/18 which revealed patent RCA stents with mild in-stent stenosis and severe ostial 80-90% stenosis of PDA branch, stable from previous cath.There was severe ostial 80-90% stenosis of a medium caliber dual PDA branch which was jailed by previous stent. The appearance is stable compared to previous PCI result. There was diffuse ostial/proximal LCx stenosis (30-40%) with a 40-50% distal LCx/OM stenosis. The LAD had diffuse, mild-moderate calcification.EF was felt to be 40-45% at that time. He had been doing fairly well with regard to heart failure, rhythm etc. on metoprolol succinate 100 mg daily. On day of presentation he noted that he was having repeated shocks from his AICD. On presentation to the emergency room interrogation of the device revealed that he had appropriate shocks for ventricular tachycardia but also had multiple shocks for A. fib with RVR. Echo done during afib with rvr was read as ef of 25-30%. Converted to nsr with amiodarone drip. Now back in afib with rvr and  evidence clinically of chf. Has had meds held due to hypotension.   Subjective   SOB.  Inpatient Medications    . amiodarone  400 mg Oral BID  . apixaban  5 mg Oral Q12H  . atorvastatin  40 mg Oral Daily  . Chlorhexidine Gluconate Cloth  6 each Topical Daily  . clopidogrel  75 mg Oral Daily  . furosemide  40 mg Intravenous Q12H  . gabapentin  300 mg Oral TID  . ipratropium-albuterol  3 mL Nebulization QID  . lipase/protease/amylase  24,000 Units Oral BID  . magnesium chloride  1 tablet Oral Daily  . metoprolol succinate  100 mg Oral Daily  . multivitamin with minerals  1 tablet Oral Daily  . pantoprazole  40 mg Oral Daily    Vital Signs    Vitals:   10/28/19 0400 10/28/19 0516 10/28/19 0626 10/28/19 0700  BP: (!) 88/46 (!) 111/50 (!) 91/47 (!) 98/48  Pulse: 62 71 76 (!) 109  Resp: 16 (!) 26 (!) 21 (!) 29  Temp:  98.5 F (36.9 C)    TempSrc:  Oral    SpO2: 100% 94% 93% (!) 72%  Weight:      Height:        Intake/Output Summary (Last 24 hours) at 10/28/2019 0926 Last data filed at 10/28/2019 0600 Gross per 24 hour  Intake 771.47 ml  Output --  Net 771.47 ml   Filed Weights   10/25/19 2108 10/26/19 2317  Weight: 77.1 kg 78.6 kg    Physical Exam    GEN: Well nourished, well developed, in no acute distress.  HEENT: normal.  Neck: Supple, no JVD, carotid bruits, or  masses. Cardiac: RRR, no murmurs, rubs, or gallops. No clubbing, cyanosis, edema.  Radials/DP/PT 2+ and equal bilaterally.  Respiratory:  Respirations regular and unlabored, clear to auscultation bilaterally. GI: Soft, nontender, nondistended, BS + x 4. MS: no deformity or atrophy. Skin: warm and dry, no rash. Neuro:  Strength and sensation are intact. Psych: Normal affect.  Labs    CBC Recent Labs    10/25/19 2110 10/26/19 0420  WBC 6.7 8.1  NEUTROABS 6.1  --   HGB 11.0* 10.4*  HCT 33.3* 31.9*  MCV 92.0 92.2  PLT 180 833   Basic Metabolic Panel Recent Labs    10/25/19 2110  10/26/19 0420 10/27/19 0550  NA 137 138  --   K 3.4* 4.2  --   CL 103 103  --   CO2 21* 24  --   GLUCOSE 199* 132*  --   BUN 32* 37*  --   CREATININE 2.39* 2.35*  --   CALCIUM 9.4 9.0  --   MG  --   --  2.3   Liver Function Tests No results for input(s): AST, ALT, ALKPHOS, BILITOT, PROT, ALBUMIN in the last 72 hours. No results for input(s): LIPASE, AMYLASE in the last 72 hours. Cardiac Enzymes No results for input(s): CKTOTAL, CKMB, CKMBINDEX, TROPONINI in the last 72 hours. BNP No results for input(s): BNP in the last 72 hours. D-Dimer No results for input(s): DDIMER in the last 72 hours. Hemoglobin A1C No results for input(s): HGBA1C in the last 72 hours. Fasting Lipid Panel Recent Labs    10/26/19 0420  CHOL 139  HDL 34*  LDLCALC 88  TRIG 87  CHOLHDL 4.1   Thyroid Function Tests Recent Labs    10/26/19 1942  TSH 0.446    Telemetry    afib with rvr  ECG    afib-rvr   Radiology    NM Pulmonary Perf and Vent  Result Date: 10/27/2019 CLINICAL DATA:  Atrial fibrillation with shortness of breath EXAM: NUCLEAR MEDICINE VENTILATION - PERFUSION LUNG SCAN VIEWS: Anterior, posterior, left lateral, right lateral, RPO, LPO, RAO, LAO-ventilation and perfusion RADIOPHARMACEUTICALS:  32.77 mCi of Tc-29m DTPA aerosol inhalation and 4.41 mCi Tc68m MAA IV COMPARISON:  Chest radiograph Oct 27, 2019 FINDINGS: Ventilation: There is relative photopenia throughout the lateral aspect of the right lung with involvement of several segments in the upper and lower lobes on the right. Scattered subsegmental defects noted on the left. Perfusion: There is a matching defect involving a portion of the lateral aspect of the right lung with involvement of several segments in the upper and lower lobes in a matching distribution. There are matching subsegmental defects on the left. There is no appreciable ventilation/perfusion mismatch. IMPRESSION: Fairly extensive matching ventilation perfusion  defects on the right without upper associated chest radiographic abnormality in this area. No appreciable ventilation/perfusion mismatch. Per PIOPED II guidelines, this study falls into an intermediate category for pulmonary embolus. Electronically Signed   By: Lowella Grip III M.D.   On: 10/27/2019 14:09   DG Chest Port 1 View  Result Date: 10/27/2019 CLINICAL DATA:  79 year old male with shortness of breath. EXAM: PORTABLE CHEST 1 VIEW COMPARISON:  Chest radiograph dated 09/17/2019. FINDINGS: There is stable cardiomegaly with vascular congestion. Overall interval improvement in the degree of congestion since the prior radiograph. No edema. No focal consolidation, pleural effusion, pneumothorax. Left pectoral AICD device. No acute osseous pathology. IMPRESSION: Cardiomegaly with vascular congestion. No focal consolidation. Electronically Signed   By: Milas Hock  Radparvar M.D.   On: 10/27/2019 14:17    Assessment & Plan    1. A. fib/VT --Patient has received multiple AICD treatments. Review of interrogation of his device showed that these were for ventricular tachycardia as well as for A. fib with RVR. Patient is on metoprolol succinate at 100 mg as an outpatient anticoagulated with Eliquis. He reports compliance with his medications. He denies missing any meds or ingesting any new substances. He currently is hemodynamically stable but in A. fib with RVR. Appreciate input from Dr. Adam Phenix from electrophysiology.   Has converted to normal sinus rhythm with increased dosing of amiodarone.  Was taken off of his amiodarone drip due to hypotension this am. No back in afib with rvr with clinical and xray evidence of chf. Has had lasix held.  WIll use amiodarone 400 bid po bid for now with consideration for rebolus of iv amiodarone. Requested transfer to [facility]. Have called. No beds at present.   2. Elevated troponin -Demand secondary to RVR and cardioversions. Does not appear to be having an  acute coronary event.  EF has changed. Not cath candidate at present due to renal funciton. Acure on chronic  3. CKD-acute on chronic renal insufficiency. Will closely follow.  4. CAD-status post coronary PCI. Does not appear to be having an acute coronary event at present. HOwever, ef has reduce per echo. Will attempt re echo when back in nsr.   5. HFr EF  -EF was 40-45% by cath last year in nsr. EF read as 25-30% 0on echo this admission during afib with rvr. WIll diurese as pressure tolerates. Support pressure with pressors if needed.    Signed, Javier Docker Zlatan Hornback MD 10/28/2019, 9:26 AM  Pager: [phone]

## 2019-10-28 NOTE — Consult Note (Signed)
Matthew Zamora MRN: [id number] DOB/AGE: 10-19-1940 79 y.o. Primary Care Physician:Hedrick, Jeneen Rinks, MD Admit date: 10/25/2019 Chief Complaint:  Chief Complaint  Patient presents with  . Tachycardia   HPI: Patient is a 79 year old Caucasian male with a past medical history of CAD, s/p PCI, abdominal aortic aneurysm, hypertension, CHF with EF of around 20--25%, s/p AICD, CKD stage IV, atrial fibrillation who came to the ER with chief complaint of shortness of breath.  History of present illness dates back to September 17, 2018 when patient was admitted at [facility] with chief complaint of acute on chronic respiratory failure and acute on chronic CHF.  Patient was treated and discharged thereafter patient was admitted at [facility] on September 27, 2018 and underwent cardiac cath on September 28, 2018 .  Patient presented to [facility] on May 11 with chief complaint of palpitation and weakness. Patient AICD was evaluated and it showed that it fired several times. Patient was found to have A. fib with RVR and also ventricular tachycardia. During evaluation it was found that it had fired around 50 times. Patient was started on IV amiodarone drip. Patient IV amiodarone drip was later DC'd secondary to hypotension Patient was seen today in ICU.  Patient offers no new complaints No complaint of chest pain No complaint of palpitation patient says my heart is now better No complaint of fever cough or chills No complaint of shortness of breath  I discussed patient's kidney related issues with patient and his family present in the ICU room as well as with ICU team  Past Medical History:  Diagnosis Date  . Abdominal aortic aneurysm (AAA) ([place]) 05/13/15   seen on ct scan  . 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 Anemia   . 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 CAD (coronary artery disease)   . Cataract cortical, senile   . CHF (congestive heart failure) ([place])    . Chronic hoarseness   . Exocrine pancreatic insufficiency   . H. pylori infection   . History of hepatitis   . Hyperlipidemia   . Hypertension   . 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 PAF (paroxysmal atrial fibrillation) ([place])   . Prostate CA [facility])         Family History  Problem Relation Age of Onset  . Heart attack Mother   . Heart attack Father     Social History:  reports that he quit smoking about 20 years ago. His smoking use included cigarettes. He has a 38.00 pack-year smoking history. He has never used smokeless tobacco. He reports previous drug use. He reports that he does not drink alcohol.   Allergies: No Known Allergies  Medications Prior to Admission  Medication Sig Dispense Refill  . albuterol (VENTOLIN HFA) 108 (90 Base) MCG/ACT inhaler Inhale into the lungs as needed.     [name] atorvastatin (LIPITOR) 40 MG tablet Take 40 mg by mouth daily.    . clopidogrel (PLAVIX) 75 MG tablet Take 75 mg by mouth daily.  5  . ELIQUIS 5 MG TABS tablet Take 5 mg by mouth every 12 (twelve) hours.    . furosemide (LASIX) 40 MG tablet Take 40 mg by mouth daily.     [name] gabapentin (NEURONTIN) 300 MG capsule 300 mg. Take 2 capsules(600mg ) by mouth nightly    . lisinopril (PRINIVIL,ZESTRIL) 5 MG tablet Take 5 mg by mouth daily.    . magnesium (MAGTAB) 84 MG (7MEQ) TBCR SR tablet Take 84 mg by mouth daily.    [name]  Melatonin 1 MG TABS Take 1 tablet by mouth at bedtime as needed.    . metoprolol succinate (TOPROL-XL) 100 MG 24 hr tablet Take 100 mg by mouth daily. Take 1 tablet by mouth daily. Take with or immediately following a meal.    . Multiple Vitamins-Minerals (MULTIVITAMIN WITH MINERALS) tablet Take 1 tablet by mouth daily.    [name] omeprazole (PRILOSEC) 20 MG capsule Take 20 mg by mouth daily.    . tadalafil (CIALIS) 20 MG tablet TAKE 1 TABLET BY MOUTH ONCE DAILY AS NEEDED FOR ERECTILE DYSFUNCTION    . lipase/protease/amylase (CREON) 12000 units CPEP capsule Take 24,000 Units by mouth 2 (two) times  daily at 10 am and 4 pm.         WUJ:79 y.o. from the symptoms mentioned above,there are no other symptoms referable to all systems reviewed.  [name] amiodarone  400 mg Oral BID  . apixaban  5 mg Oral Q12H  . atorvastatin  40 mg Oral Daily  . Chlorhexidine Gluconate Cloth  6 each Topical Daily  . clopidogrel  75 mg Oral Daily  . furosemide  40 mg Intravenous Q12H  . gabapentin  300 mg Oral TID  . ipratropium-albuterol  3 mL Nebulization TID  . lipase/protease/amylase  24,000 Units Oral BID  . magnesium chloride  1 tablet Oral Daily  . metoprolol succinate  100 mg Oral Daily  . multivitamin with minerals  1 tablet Oral Daily  . pantoprazole  40 mg Oral Daily         YNW:GNFAO from the symptoms mentioned above,there are no other symptoms referable to all systems reviewed.  Physical Exam: Vital signs in last 24 hours: Temp:  [98.4 F (36.9 C)-100 F (37.8 C)] 98.4 F (36.9 C) (05/13 0800) Pulse Rate:  [62-115] 96 (05/13 1330) Resp:  [15-34] 17 (05/13 1330) BP: (57-111)/(37-78) 92/54 (05/13 1330) SpO2:  [72 %-100 %] 92 % (05/13 1330) Weight change:  Last BM Date: 10/26/19  Intake/Output from previous day: 05/12 0701 - 05/13 0700 In: 771.5 [I.V.:771.5] Out: -  Total I/O In: 10.4 [I.V.:10.4] Out: -    Physical Exam: General- pt is awake,alert, oriented to time place and person Resp- No acute REsp distress, CTA B/L MINIMAL Rhonchi CVS- S1S2 regular in rate and rhythm GIT- BS+, soft, NT, ND EXT- NO LE Edema, no cyanosis CNS- CN 2-12 grossly intact. Moving all 4 extremities Psych- normal mood and affect    Lab Results: CBC Recent Labs    10/26/19 0420 10/28/19 0941  WBC 8.1 5.9  HGB 10.4* 9.6*  HCT 31.9* 29.2*  PLT 174 148*    BMET Recent Labs    10/26/19 0420 10/28/19 0941  NA 138 129*  K 4.2 3.9  CL 103 92*  CO2 24 24  GLUCOSE 132* 172*  BUN 37* 64*  CREATININE 2.35* 5.88*  CALCIUM 9.0 8.4*   Creatinine trend creatinine trend 2021 2.4==>  5.9 1.4--1.8 baseline  2020 1.4--1.9 2019 1.5--2.5-AKI 2017 2.0  MICRO Recent Results (from the past 240 hour(s))  SARS Coronavirus 2 by RT PCR (hospital order, performed in [facility] hospital lab) Nasopharyngeal Nasopharyngeal Swab     Status: None   Collection Time: 10/26/19 10:52 PM   Specimen: Nasopharyngeal Swab  Result Value Ref Range Status   SARS Coronavirus 2 NEGATIVE NEGATIVE Final    Comment: (NOTE) SARS-CoV-2 target nucleic acids are NOT DETECTED. The SARS-CoV-2 RNA is generally detectable in upper and lower respiratory specimens during the acute phase of infection. The  lowest concentration of SARS-CoV-2 viral copies this assay can detect is 250 copies / mL. A negative result does not preclude SARS-CoV-2 infection and should not be used as the sole basis for treatment or other patient management decisions.  A negative result may occur with improper specimen collection / handling, submission of specimen other than nasopharyngeal swab, presence of viral mutation(s) within the areas targeted by this assay, and inadequate number of viral copies (<250 copies / mL). A negative result must be combined with clinical observations, patient history, and epidemiological information. Fact Sheet for Patients:   StrictlyIdeas.no Fact Sheet for Healthcare Providers: BankingDealers.co.za This test is not yet approved or cleared  by the [place] FDA and has been authorized for detection and/or diagnosis of SARS-CoV-2 by FDA under an Emergency Use Authorization (EUA).  This EUA will remain in effect (meaning this test can be used) for the duration of the COVID-19 declaration under Section 564(b)(1) of the Act, 21 U.S.C. section 360bbb-3(b)(1), unless the authorization is terminated or revoked sooner. Performed at [facility], [address] [place], [place] [address]   MRSA PCR Screening     Status: Abnormal    Collection Time: 10/26/19 11:36 PM   Specimen: Nasopharyngeal  Result Value Ref Range Status   MRSA by PCR POSITIVE (A) NEGATIVE Final    Comment:        The GeneXpert MRSA Assay (FDA approved for NASAL specimens only), is one component of a comprehensive MRSA colonization surveillance program. It is not intended to diagnose MRSA infection nor to guide or monitor treatment for MRSA infections. RESULT CALLED TO, READ BACK BY AND VERIFIED WITH: [place] [phone] 10/27/19 HNM Performed at [facility], [address] [place], [place] [address]       Lab Results  Component Value Date   CALCIUM 8.4 (L) 10/28/2019   PHOS 3.7 09/18/2018      Impression:    Patient is a 79 year old Caucasian male with a past medical history of CHF with EF of 25 to 30%, paroxysmal atrial fibrillation, abdominal organism, hyperlipidemia, hypertension, who presented to the ER with chief complaint of weakness and palpitations. Patient is being admitted with A. 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 Phosphorus will check  5) A. fib with RVR Patient was on amiodarone IV drip Cardiology and primary team are following  6) electrolytes  Hyponatremia-secondary to AKI on CKD stage IV, inability to get rid of free water  Normokalemic   7)Acid base Co2 at goal     Plan:  For AKI We will ask for renal ultrasound . We will ask for FENA.  For CKD bone mineral disease We will ask for intact PTH and phosphorus levels   Decision regarding renal placement  therapy We will give the patient small bolus of 250 mL We will follow patient I&O's closely No acute indication for renal replacement therapy at this moment-no hyperkalemia/no acidosis/no fluid overload I did discuss with the patient about possible need for renal placement therapy.  Patient understands the risk and benefit.Patient family understand as well  Matthew Zamora s Theador Hawthorne 10/28/2019, 3:26 PM

## 2019-10-28 NOTE — Consult Note (Signed)
Consultation Note Date: 10/28/2019   Patient Name: Matthew Zamora  DOB: [birth year]  MRN: [id number]  Age / Sex: 79 y.o., male  PCP: Maryland Pink, MD Referring Physician: Fritzi Mandes, MD  Reason for Consultation: Establishing goals of care  HPI/Patient Profile: Matthew Zamora is a 79 y.o. 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  Patient has AICD in place.  He fell it fired several times.  According to patient every time he moved it will fire.  Patient apparently has fallen and has been weak.   Clinical Assessment and Goals of Care: Patient is resting in bed. His son and his son's mother is at bedside. Dr. Mortimer Fries is at bedside and discussing [place]. We discussed his cardiac and renal status. We discussed options for care moving forward. His son would like him sent to [facility].   Son stepped out for CCM to place central line. Patient resting in bed speaking with nursing. In to offer support. He states he lives alone and has been fully independent prior to this hospitalization. Patient discusses family dynamics. He states he knows he will not live much longer. He states he wants to live long enough to disperse his possessions to his children himself. He would like all care until this time. Son arrived back to bedside. Preparations being put in place to begin CRRT.   Plans for patient to be moved to [facility]. Recommend palliative to follow there.      SUMMARY OF RECOMMENDATIONS   Recommend palliative to continue to follow.    Prognosis:   Poor overall       Primary Diagnoses: Present on Admission: . Tachycardia . Paroxysmal atrial  fibrillation ([name]) . Essential hypertension . Hyperlipidemia . Acute on chronic combined systolic and diastolic CHF (congestive heart failure) ([place])   I have reviewed the medical record, interviewed the patient and family, and examined the patient. The following aspects are pertinent.  Past Medical History:  Diagnosis Date  . Abdominal aortic aneurysm (AAA) ([place]) 05/13/15   seen on ct scan  . 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 Anemia   . 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 CAD (coronary artery disease)   . Cataract cortical, senile   . CHF (congestive heart failure) ([place])   . Chronic hoarseness   . Exocrine pancreatic insufficiency   . H. pylori infection   . History of hepatitis   . Hyperlipidemia   . Hypertension   . 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 PAF (paroxysmal atrial fibrillation) ([place])   . Prostate CA [facility])    Social History   Socioeconomic History  . Marital status: Single    Spouse name: Not on file  . Number of children: Not on file  . Years of education: Not on file  . Highest education level: Not on file  Occupational History  . Not on file  Tobacco Use  . Smoking status: Former Smoker    Packs/day: 1.00    Years: 38.00    Pack years: 38.00    Types: Cigarettes    Quit date: 06/18/1999    Years since quitting: 20.3  . Smokeless tobacco: Never Used  Substance and Sexual Activity  . Alcohol use: No    Alcohol/week: 0.0 standard drinks  . Drug use: Not Currently  . Sexual activity: Not Currently  Other Topics Concern  . Not on file  Social History Narrative  . Not on file   Social Determinants of Health   Financial Resource Strain:   . Difficulty of Paying Living Expenses:   Food Insecurity:   . Worried About Charity fundraiser in the Last Year:   . Arboriculturist in the Last Year:   Transportation Needs:   . Film/video editor (Medical):   [name] Lack of Transportation (Non-Medical):   Physical Activity:   . Days of  Exercise per Week:   . Minutes of Exercise per Session:   Stress:   . Feeling of Stress :   Social Connections:   . Frequency of Communication with Friends and Family:   . Frequency of Social Gatherings with Friends and Family:   . Attends Religious Services:   . Active Member of Clubs or Organizations:   . Attends Archivist Meetings:   [name] Marital Status:    Family History  Problem Relation Age of Onset  . Heart attack Mother   . Heart attack Father    Scheduled Meds: . amiodarone  400 mg Oral BID  . apixaban  5 mg Oral Q12H  . atorvastatin  40 mg Oral Daily  . Chlorhexidine Gluconate Cloth  6 each Topical Daily  . clopidogrel  75 mg Oral Daily  . furosemide  40 mg Intravenous Q12H  . gabapentin  300 mg Oral TID  . ipratropium-albuterol  3 mL Nebulization TID  . lipase/protease/amylase  24,000 Units Oral BID  . magnesium chloride  1 tablet Oral Daily  . metoprolol succinate  100 mg Oral Daily  . multivitamin with minerals  1 tablet Oral Daily  . pantoprazole  40 mg Oral Daily   Continuous Infusions: . sodium chloride    . norepinephrine (LEVOPHED) Adult infusion 12 mcg/min (10/28/19 1354)   PRN Meds:.acetaminophen, albuterol, melatonin, metoprolol tartrate, ondansetron (ZOFRAN) IV Medications Prior to Admission:  Prior to Admission medications   Medication Sig Start Date End Date Taking? Authorizing Provider  albuterol (VENTOLIN HFA) 108 (90 Base) MCG/ACT inhaler Inhale into the lungs as needed.  09/24/18  Yes [provider]  atorvastatin (LIPITOR) 40 MG tablet Take 40 mg by mouth daily.   Yes [provider]  clopidogrel (PLAVIX) 75 MG tablet Take 75 mg by mouth daily. 04/15/18  Yes [provider]  ELIQUIS 5 MG TABS tablet Take 5 mg by mouth every 12 (twelve) hours. 10/21/18  Yes [provider]  furosemide (LASIX) 40 MG tablet Take 40 mg by mouth daily.    Yes [provider]  gabapentin (NEURONTIN) 300 MG capsule 300  mg. Take 2 capsules(600mg ) by mouth nightly 12/17/18  Yes [provider]  lisinopril (PRINIVIL,ZESTRIL) 5 MG tablet Take 5 mg by mouth daily.   Yes [provider]  magnesium (MAGTAB) 84 MG (7MEQ) TBCR SR tablet Take 84 mg by mouth daily.   Yes [provider]  Melatonin 1 MG  TABS Take 1 tablet by mouth at bedtime as needed. 12/27/13  Yes [provider]  metoprolol succinate (TOPROL-XL) 100 MG 24 hr tablet Take 100 mg by mouth daily. Take 1 tablet by mouth daily. Take with or immediately following a meal.   Yes [provider]  Multiple Vitamins-Minerals (MULTIVITAMIN WITH MINERALS) tablet Take 1 tablet by mouth daily.   Yes [provider]  omeprazole (PRILOSEC) 20 MG capsule Take 20 mg by mouth daily.   Yes [provider]  tadalafil (CIALIS) 20 MG tablet TAKE 1 TABLET BY MOUTH ONCE DAILY AS NEEDED FOR ERECTILE DYSFUNCTION 07/13/18  Yes [provider]  lipase/protease/amylase (CREON) 12000 units CPEP capsule Take 24,000 Units by mouth 2 (two) times daily at 10 am and 4 pm.    [provider]   No Known Allergies Review of Systems  Constitutional: Positive for activity change and fatigue.    Physical Exam Pulmonary:     Effort: Pulmonary effort is normal.  Neurological:     Mental Status: He is alert.     Vital Signs: BP (!) 92/54   Pulse 96   Temp 98.4 F (36.9 C) (Oral)   Resp 17   Ht 5\' 9"  (1.753 m)   Wt 78.6 kg   SpO2 92%   BMI 25.59 kg/m  Pain Scale: 0-10 POSS *See Group Information*: 1-Acceptable,Awake and alert Pain Score: 0-No pain   SpO2: SpO2: 92 % O2 Device:SpO2: 92 % O2 Flow Rate: .O2 Flow Rate (L/min): 3.5 L/min  IO: Intake/output summary:   Intake/Output Summary (Last 24 hours) at 10/28/2019 1437 Last data filed at 10/28/2019 1330 Gross per 24 hour  Intake 781.84 ml  Output --  Net 781.84 ml    LBM: Last BM Date: 10/26/19 Baseline Weight: Weight: 77.1 kg Most recent  weight: Weight: 78.6 kg        Time In: 3:00 Time Out: 3:45 Time Total: 45 min Greater than 50%  of this time was spent counseling and coordinating care related to the above assessment and plan.  Signed by: Asencion Gowda, NP   Please contact Palliative Medicine Team phone at [phone] for questions and concerns.  For individual provider: See Shea Evans

## 2019-10-29 ENCOUNTER — Ambulatory Visit (HOSPITAL_COMMUNITY)
Admission: AD | Admit: 2019-10-29 | Discharge: 2019-10-29 | Disposition: A | Discharge status: Home / Self Care | Payer: Medicare Other | Source: Other Acute Inpatient Hospital | Attending: Internal Medicine | Admitting: Internal Medicine

## 2019-10-29 DIAGNOSIS — I4891 Unspecified atrial fibrillation: Secondary | ICD-10-CM | POA: Insufficient documentation

## 2019-10-29 LAB — CBC
HCT: 28 % — ABNORMAL LOW (ref 39.0–52.0)
Hemoglobin: 9.4 g/dL — ABNORMAL LOW (ref 13.0–17.0)
MCH: 29.9 pg (ref 26.0–34.0)
MCHC: 33.6 g/dL (ref 30.0–36.0)
MCV: 89.2 fL (ref 80.0–100.0)
Platelets: 194 10*3/uL (ref 150–400)
RBC: 3.14 MIL/uL — ABNORMAL LOW (ref 4.22–5.81)
RDW: 14.1 % (ref 11.5–15.5)
WBC: 7.1 10*3/uL (ref 4.0–10.5)
nRBC: 0 % (ref 0.0–0.2)

## 2019-10-29 LAB — BASIC METABOLIC PANEL
Anion gap: 10 (ref 5–15)
BUN: 70 mg/dL — ABNORMAL HIGH (ref 8–23)
CO2: 24 mmol/L (ref 22–32)
Calcium: 8.3 mg/dL — ABNORMAL LOW (ref 8.9–10.3)
Chloride: 99 mmol/L (ref 98–111)
Creatinine, Ser: 5.8 mg/dL — ABNORMAL HIGH (ref 0.61–1.24)
GFR calc Af Amer: 10 mL/min — ABNORMAL LOW (ref >60)
GFR calc non Af Amer: 9 mL/min — ABNORMAL LOW (ref >60)
Glucose, Bld: 156 mg/dL — ABNORMAL HIGH (ref 70–99)
Potassium: 3.8 mmol/L (ref 3.5–5.1)
Sodium: 133 mmol/L — ABNORMAL LOW (ref 135–145)

## 2019-10-29 LAB — MAGNESIUM: Magnesium: 2.5 mg/dL — ABNORMAL HIGH (ref 1.7–2.4)

## 2019-10-29 LAB — PHOSPHORUS: Phosphorus: 4.1 mg/dL (ref 2.5–4.6)

## 2019-10-29 MED ORDER — SODIUM CHLORIDE 0.9 % IV SOLN: 250.0000 mL | INTRAVENOUS | 0 refills | Status: DC

## 2019-10-29 MED ORDER — ADULT MULTIVITAMIN W/MINERALS CH: 1.0000 | ORAL_TABLET | Freq: Every day | ORAL | Status: DC

## 2019-10-29 MED ORDER — CLOPIDOGREL BISULFATE 75 MG PO TABS: 75.0000 mg | ORAL_TABLET | Freq: Every day | ORAL | Status: DC

## 2019-10-29 MED ORDER — NOREPINEPHRINE 4 MG/250ML-% IV SOLN: 0.0000 ug/min | INTRAVENOUS | Status: DC

## 2019-10-29 MED ORDER — CHLORHEXIDINE GLUCONATE CLOTH 2 % EX PADS: 6.0000 | MEDICATED_PAD | Freq: Every day | CUTANEOUS | Status: DC

## 2019-10-29 MED ORDER — MELATONIN 5 MG PO TABS: 2.5000 mg | ORAL_TABLET | Freq: Every evening | ORAL | 0 refills | Status: DC | PRN

## 2019-10-29 MED ORDER — ALBUTEROL SULFATE (2.5 MG/3ML) 0.083% IN NEBU: 2.5000 mg | INHALATION_SOLUTION | RESPIRATORY_TRACT | 12 refills | Status: DC | PRN

## 2019-10-29 MED ORDER — AMIODARONE HCL 400 MG PO TABS: 400.0000 mg | ORAL_TABLET | Freq: Two times a day (BID) | ORAL | Status: DC

## 2019-10-29 MED ORDER — MAGNESIUM CHLORIDE 64 MG PO TBEC: 1.0000 | DELAYED_RELEASE_TABLET | Freq: Every day | ORAL | Status: DC

## 2019-10-29 MED ORDER — GABAPENTIN 300 MG PO CAPS: 300.0000 mg | ORAL_CAPSULE | Freq: Three times a day (TID) | ORAL | Status: DC

## 2019-10-29 MED ORDER — HEPARIN SOD (PORK) LOCK FLUSH 10 UNIT/ML IV SOLN: 20.0000 [IU] | Freq: Two times a day (BID) | INTRAVENOUS | 0 refills | Status: DC

## 2019-10-29 MED ORDER — FUROSEMIDE 10 MG/ML IJ SOLN: 40.0000 mg | Freq: Two times a day (BID) | INTRAMUSCULAR | 0 refills | Status: DC

## 2019-10-29 MED ORDER — ACETAMINOPHEN 325 MG PO TABS: 650.0000 mg | ORAL_TABLET | ORAL | Status: DC | PRN

## 2019-10-29 MED ORDER — SODIUM CHLORIDE 0.9% FLUSH: 10.0000 mL | Freq: Two times a day (BID) | INTRAVENOUS | Status: DC

## 2019-10-29 MED ORDER — IPRATROPIUM-ALBUTEROL 0.5-2.5 (3) MG/3ML IN SOLN: 3.0000 mL | Freq: Three times a day (TID) | RESPIRATORY_TRACT | Status: DC

## 2019-10-29 MED ORDER — HEPARIN SOD (PORK) LOCK FLUSH 10 UNIT/ML IV SOLN: 20.0000 [IU] | INTRAVENOUS | 0 refills | Status: DC | PRN

## 2019-10-29 MED ORDER — ONDANSETRON HCL 4 MG/2ML IJ SOLN: 4.0000 mg | Freq: Four times a day (QID) | INTRAMUSCULAR | 0 refills | Status: DC | PRN

## 2019-10-29 MED ORDER — PANTOPRAZOLE SODIUM 40 MG PO TBEC: 40.0000 mg | DELAYED_RELEASE_TABLET | Freq: Every day | ORAL | Status: DC

## 2019-10-29 NOTE — Progress Notes (Signed)
VSS. Pt not in distress at this time. [facility] at bedside to transport pt. Extra bag of Levophed sent with [facility]. Per [facility] on night shift, Report called to RN at [facility].

## 2019-10-29 NOTE — Progress Notes (Signed)
Pt is accepted to [facility] by Dr Kerry Dory to [facility] CU room 3726, report was given in recorded open line to [place] and [employer] care team. [facility] back and they ave no grand transportation and have reach out to care link. Plan is for [facility] to transport pt  Around 0800am today 10/29/2019.

## 2019-10-29 NOTE — Progress Notes (Signed)
Reached out to [facility] for update regarding pt's transfer status. Level of care updated to ICU.  Patient accepted by Dr. Kerry Dory and assigned to CICU bed 3726.  Dr. Ubaldo Glassing notified via secure chat. Darel Hong, NP and Rufina Falco, NP also notified. Report called to [facility] by Imma, RN and now awaiting transport.

## 2019-10-29 NOTE — Discharge Summary (Signed)
Physician Discharge Summary  Patient ID: Matthew Zamora MRN: [id number] DOB/AGE: [birth year] 79 y.o.  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 (EF 09-73%) Cardiogenic Shock Atrial Fibrillation with RVR / Ventricular Tachycardia Elevated troponin, likely demand ischemia -Cardiology following, appreciate input -Continuous cardiac monitoring -Maintain MAP >65 -Vasopressors as needed to maintain MAP goal -Trend lactic acid -Hold Metoprolol, Lisinopril -Diuretics as BP and renal function permits ~ currently on hold due to hypotension -Continue Amiodarone (previously on IV now switched to PO per Cardiology) -Continue Eliquis for Anticoagulation -Trend troponin until downtrending  Acute Hypoxic Respiratory Failure -Supplemental O2 as needed to maintain O2 saturations >92% -Follow intermittent CXR & ABG as needed -Diuretics as BP & renal function permits ~ currently on hold due to hypotension  Cardiorenal Syndrome AKI on CKD Stage III -Monitor I&O's / urinary output -Follow BMP -Ensure adequate renal perfusion -Avoid nephrotoxic agents as able -Replace electrolytes as indicated -Nephrology following, appreciate input ~ may possibly need Renal Replacement therapy  Anemia of Chronic Disease -Monitor for S/Sx of bleeding -Trend CBC -Continue Eliquis for VTE Prophylaxis/Anticoagulation  -Transfuse for Hgb <8                       DISCHARGE SUMMARY   79 y.o.malewith history ofcoronary artery diseases/p PCI x3 to RCA, an abdominal aortic aneurysm, hypertension,HFrEF  with ejection fraction in the 20 to 25% ranges/p AICD [facility], chronic kidney disease,chronic atrial fibrillationand anemia who presents fora follow up visit.   He was admitted to [facility] on 09/17/18 with acute on chronic respiratory failure, acute on chronic systolic heart failure. Was discharged and then presented to [facility] on 09/27/18 and underwent cardiac catheterization on 09/28/18 which revealed patent RCA stents with mild in-stent stenosis and severe ostial 80-90% stenosis of PDA branch, stable from previous cath.   He hadbeen doing fairly well with regard to heart failure, rhythm etc. on metoprolol succinate 100 mg daily. On day of presentation he noted that he was having repeated shocks from his AICD.   On presentation to the emergency room interrogation of the device revealed that he had appropriate shocks for ventricular tachycardia but also had multiple shocks for A. fib with RVR.  Echo done during afib with rvr was read as ef of 25-30%. Converted to nsr with amiodarone drip. Now back in afib with rvr and evidence clinically of Systolic CHF. Has had meds held due to hypotension.  On 10/28/2019 he developed hypotension and was placed on [place]. NOW WITH PROGRESSIVE RENAL FAILURE.              SIGNIFICANT DIAGNOSTIC STUDIES  10/27/19: V/Q Scan>>Fairly extensive matching ventilation perfusion defects on the right without upper associated chest radiographic abnormality in this area. No appreciable ventilation/perfusion mismatch. Per PIOPED  II guidelines, this study falls into an intermediate category for pulmonary embolus. 10/27/19: CXR>>There is stable cardiomegaly with vascular congestion. Overall interval improvement in the degree of congestion since the prior radiograph. No edema. No focal consolidation, pleural effusion, pneumothorax. Left pectoral AICD device. No acute osseous pathology. 10/28/19: CXR>>Heart size mildly enlarged.   AICD unchanged in position. Progressive left lower lobe airspace disease. Small left effusion. Right lung is clear. Negative for heart failure or edema. 10/28/19: CXR>>No pneumothorax following central line placement. Persistent LEFT lung infiltrates and minimal RIGHT basilar atelectasis.   MICRO DATA  SARS-CoV-2 PCR 5/11>> NEGATIVE MRSA PCR 5/11>> POSITIVE  ANTIBIOTICS N/A  CONSULTS Cardiology Nephrology PCCM Palliative Care   TUBES / LINES Right IJ Trialysis Catheter 5/13>>   Discharge Exam: Gen:  Weakness, +SOB HEENT: Denies blurred vision, double vision, ear pain, eye pain, hearing loss, nose bleeds, sore throat Cardiac:  No dizziness, chest pain or heaviness, chest tightness,edema, +JVD Resp:  +shortness of breath, Gi: Denies swallowing difficulty, stomach pain, nausea or vomiting, diarrhea, constipation, bowel incontinence Gu:  Denies bladder incontinence, burning urine Ext:   Denies Joint pain, stiffness or swelling Skin: Denies  skin rash, easy bruising or bleeding or hives Endoc:  Denies polyuria, polydipsia , polyphagia or weight change Psych:   Denies depression, insomnia or hallucinations  Other:  All other systems negative  Vitals:   10/29/19 0330 10/29/19 0400 10/29/19 0430 10/29/19 0500  BP: (!) 89/61 105/60 (!) 117/59 (!) 111/54  Pulse: (!) 106 (!) 109 (!) 104 98  Resp: _0 Temp:  99 F (37.2 C)    TempSrc:  Oral    SpO2: 95% 96% 95% 97%  Weight:      Height:         Discharge Labs  BMET Recent Labs  Lab 10/25/19 2110 10/25/19 2110 10/26/19 0420 10/27/19 0550 10/28/19 0941 10/28/19 1804  NA 137  --  138  --  129*  --   K 3.4*   < > 4.2  --  3.9  --   CL 103  --  103  --  92*  --   CO2 21*  --  24  --  24  --   GLUCOSE 199*  --  132*  --  172*  --   BUN 32*  --  37*  --  64*  --   CREATININE 2.39*  --  2.35*  --  5.88*  --   CALCIUM 9.4  --  9.0  --  8.4*  --   MG  --   --   --  2.3  --   --   PHOS  --   --   --   --    --  3.7   < > = values in this interval not displayed.    CBC Recent Labs  Lab 10/25/19 2110 10/26/19 0420 10/28/19 0941  HGB 11.0* 10.4* 9.6*  HCT 33.3* 31.9* 29.2*  WBC 6.7 8.1 5.9  PLT 180 174 148*    Anti-Coagulation No results for input(s): INR in the last 168 hours.        Allergies as of 10/29/2019   No Known Allergies     Medication List    STOP taking these medications   albuterol 108 (90 Base) MCG/ACT inhaler Commonly known as: VENTOLIN HFA Replaced by: albuterol (2.5 MG/3ML) 0.083% nebulizer solution   atorvastatin 40 MG tablet Commonly known as: LIPITOR   Eliquis 5 MG Tabs  tablet Generic drug: apixaban   furosemide 40 MG tablet Commonly known as: LASIX Replaced by: furosemide 10 MG/ML injection   lipase/protease/amylase 12000-38000 units Cpep capsule Commonly known as: CREON   lisinopril 5 MG tablet Commonly known as: ZESTRIL   magnesium 84 MG (7MEQ) Tbcr SR tablet Commonly known as: MAGTAB Replaced by: magnesium chloride 64 MG Tbec SR tablet   metoprolol succinate 100 MG 24 hr tablet Commonly known as: TOPROL-XL   omeprazole 20 MG capsule Commonly known as: PRILOSEC Replaced by: pantoprazole 40 MG tablet   tadalafil 20 MG tablet Commonly known as: CIALIS     TAKE these medications   acetaminophen 325 MG tablet Commonly known as: TYLENOL Take 2 tablets (650 mg total) by mouth every 4 (four) hours as needed for headache or mild pain.   albuterol (2.5 MG/3ML) 0.083% nebulizer solution Commonly known as: PROVENTIL Inhale 3 mLs (2.5 mg total) into the lungs as needed for shortness of breath. Replaces: albuterol 108 (90 Base) MCG/ACT inhaler   amiodarone 400 MG tablet Commonly known as: PACERONE Take 1 tablet (400 mg total) by mouth 2 (two) times daily.   Chlorhexidine Gluconate Cloth 2 % Pads Apply 6 each topically daily.   clopidogrel 75 MG tablet Commonly known as: PLAVIX Take 1 tablet (75 mg total) by mouth daily.    furosemide 10 MG/ML injection Commonly known as: LASIX Inject 4 mLs (40 mg total) into the vein every 12 (twelve) hours. Replaces: furosemide 40 MG tablet   gabapentin 300 MG capsule Commonly known as: NEURONTIN Take 1 capsule (300 mg total) by mouth 3 (three) times daily. What changed: See the new instructions.   heparin flush 10 UNIT/ML Soln injection 2 mLs (20 Units total) by Intracatheter route every 12 (twelve) hours.   heparin flush 10 UNIT/ML Soln injection 2 mLs (20 Units total) by Intracatheter route as needed (line care).   ipratropium-albuterol 0.5-2.5 (3) MG/3ML Soln Commonly known as: DUONEB Take 3 mLs by nebulization 3 (three) times daily.   magnesium chloride 64 MG Tbec SR tablet Commonly known as: SLOW-MAG Take 1 tablet (64 mg total) by mouth daily. Replaces: magnesium 84 MG (7MEQ) Tbcr SR tablet   melatonin 5 MG Tabs Take 0.5 tablets (2.5 mg total) by mouth at bedtime as needed (sleep). What changed:   medication strength  how much to take  reasons to take this   multivitamin with minerals Tabs tablet Take 1 tablet by mouth daily.   norepinephrine 4-5 MG/250ML-% Soln Commonly known as: LEVOPHED Inject 0-40 mcg/min into the vein continuous.   ondansetron 4 MG/2ML Soln injection Commonly known as: ZOFRAN Inject 2 mLs (4 mg total) into the vein every 6 (six) hours as needed for nausea.   pantoprazole 40 MG tablet Commonly known as: PROTONIX Take 1 tablet (40 mg total) by mouth daily. Replaces: omeprazole 20 MG capsule   sodium chloride 0.9 % infusion Inject 250 mLs into the vein continuous.   sodium chloride flush 0.9 % Soln Commonly known as: NS 10-40 mLs by Intracatheter route every 12 (twelve) hours.         Disposition: ICU  Discharged Condition: DAROLD MILEY has met maximum benefit of inpatient care at [facility], needs advanced Heart Failure Team.   Time spent on disposition:  60 Minutes.   Signed: Darel Hong,  AGACNP-BC [facility] Pager: [phone]

## 2019-10-29 NOTE — Progress Notes (Signed)
Pt son Chez Bulnes was informed of his father transfer to [facility] and his unit and room number given him also, second son was called and left a message to call the unit, third son was called no active voice mail.

## 2019-10-30 LAB — PTH, INTACT AND CALCIUM
Calcium, Total (PTH): 8.2 mg/dL — ABNORMAL LOW (ref 8.6–10.2)
PTH: 97 pg/mL — ABNORMAL HIGH (ref 15–65)

## 2019-10-30 MED ORDER — HEPARIN SOD (PORCINE) IN D5W 100 UNIT/ML IV SOLN
13.00 | INTRAVENOUS | Status: DC
Start: ? — End: 2019-10-30

## 2019-10-30 MED ORDER — MELATONIN 3 MG PO TABS
3.00 | ORAL_TABLET | ORAL | Status: DC
Start: 2019-10-30 — End: 2019-10-30

## 2019-10-30 MED ORDER — METRONIDAZOLE 500 MG PO TABS
500.00 | ORAL_TABLET | ORAL | Status: DC
Start: 2019-11-01 — End: 2019-10-30

## 2019-10-30 MED ORDER — AMIODARONE HCL 200 MG PO TABS
400.00 | ORAL_TABLET | ORAL | Status: DC
Start: 2019-10-30 — End: 2019-10-30

## 2019-10-30 MED ORDER — HEPARIN SODIUM (PORCINE) 1000 UNIT/ML IJ SOLN
5000.00 | INTRAMUSCULAR | Status: DC
Start: ? — End: 2019-10-30

## 2019-10-30 MED ORDER — POLYETHYLENE GLYCOL 3350 17 GM/SCOOP PO POWD
17.00 | ORAL | Status: DC
Start: 2019-11-02 — End: 2019-10-30

## 2019-10-30 MED ORDER — ACETAMINOPHEN 325 MG PO TABS
650.00 | ORAL_TABLET | ORAL | Status: DC
Start: ? — End: 2019-10-30

## 2019-10-30 MED ORDER — LIDOCAINE 5 % EX PTCH
1.00 | MEDICATED_PATCH | CUTANEOUS | Status: DC
Start: 2019-11-10 — End: 2019-10-30

## 2019-10-30 MED ORDER — GABAPENTIN 100 MG PO CAPS
100.00 | ORAL_CAPSULE | ORAL | Status: DC
Start: 2019-10-30 — End: 2019-10-30

## 2019-10-30 MED ORDER — NOREPINEPHRINE-SODIUM CHLORIDE 8-0.9 MG/250ML-% IV SOLN
0.00 | INTRAVENOUS | Status: DC
Start: ? — End: 2019-10-30

## 2019-10-30 MED ORDER — BISACODYL 10 MG RE SUPP
10.00 | RECTAL | Status: DC
Start: ? — End: 2019-10-30

## 2019-10-30 MED ORDER — SENNOSIDES 8.6 MG PO TABS
2.00 | ORAL_TABLET | ORAL | Status: DC
Start: 2019-11-02 — End: 2019-10-30

## 2019-10-30 MED ORDER — CEFEPIME HCL 2 G IJ SOLR
2.00 | INTRAMUSCULAR | Status: DC
Start: 2019-11-02 — End: 2019-10-30

## 2019-11-01 MED ORDER — METOPROLOL TARTRATE 25 MG PO TABS
25.00 | ORAL_TABLET | ORAL | Status: DC
Start: 2019-11-01 — End: 2019-11-01

## 2019-11-01 MED ORDER — LEVALBUTEROL HCL 0.31 MG/3ML IN NEBU
0.31 | INHALATION_SOLUTION | RESPIRATORY_TRACT | Status: DC
Start: 2019-11-01 — End: 2019-11-01

## 2019-11-01 MED ORDER — AMIODARONE HCL 200 MG PO TABS
400.00 | ORAL_TABLET | ORAL | Status: DC
Start: 2019-11-01 — End: 2019-11-01

## 2019-11-01 MED ORDER — BISACODYL 10 MG RE SUPP
10.00 | RECTAL | Status: DC
Start: 2019-11-03 — End: 2019-11-01

## 2019-11-01 MED ORDER — PREDNISONE 20 MG PO TABS
40.00 | ORAL_TABLET | ORAL | Status: DC
Start: 2019-11-02 — End: 2019-11-01

## 2019-11-02 MED ORDER — LEVALBUTEROL HCL 0.31 MG/3ML IN NEBU
0.31 | INHALATION_SOLUTION | RESPIRATORY_TRACT | Status: DC
Start: ? — End: 2019-11-02

## 2019-11-02 MED ORDER — PANTOPRAZOLE SODIUM 40 MG IV SOLR
40.00 | INTRAVENOUS | Status: DC
Start: 2019-11-02 — End: 2019-11-02

## 2019-11-02 MED ORDER — GENERIC EXTERNAL MEDICATION
Status: DC
Start: ? — End: 2019-11-02

## 2019-11-02 MED ORDER — GENERIC EXTERNAL MEDICATION
750.00 | Status: DC
Start: 2019-11-02 — End: 2019-11-02

## 2019-11-02 MED ORDER — GENERIC EXTERNAL MEDICATION
1.00 | Status: DC
Start: 2019-11-02 — End: 2019-11-02

## 2019-11-02 MED ORDER — OXYCODONE HCL 5 MG PO TABS
5.00 | ORAL_TABLET | ORAL | Status: DC
Start: ? — End: 2019-11-02

## 2019-11-09 MED ORDER — AMIODARONE HCL 200 MG PO TABS
400.00 | ORAL_TABLET | ORAL | Status: DC
Start: 2019-11-10 — End: 2019-11-09

## 2019-11-09 MED ORDER — APIXABAN 5 MG PO TABS
5.00 | ORAL_TABLET | ORAL | Status: DC
Start: 2019-11-09 — End: 2019-11-09

## 2019-11-09 MED ORDER — LORAZEPAM 2 MG/ML IJ SOLN
0.25 | INTRAMUSCULAR | Status: DC
Start: ? — End: 2019-11-09

## 2019-11-09 MED ORDER — CALCIUM CARBONATE 1250 (500 CA) MG PO CHEW
CHEWABLE_TABLET | ORAL | Status: DC
Start: ? — End: 2019-11-09

## 2019-11-09 MED ORDER — METOPROLOL SUCCINATE ER 200 MG PO TB24
200.00 | ORAL_TABLET | ORAL | Status: DC
Start: 2019-11-10 — End: 2019-11-09

## 2019-11-09 MED ORDER — ATORVASTATIN CALCIUM 80 MG PO TABS
80.00 | ORAL_TABLET | ORAL | Status: DC
Start: 2019-11-10 — End: 2019-11-09

## 2019-11-09 MED ORDER — BISMUTH SUBSALICYLATE 262 MG PO CHEW
262.00 | CHEWABLE_TABLET | ORAL | Status: DC
Start: ? — End: 2019-11-09

## 2019-11-09 MED ORDER — MEXILETINE HCL 150 MG PO CAPS
150.00 | ORAL_CAPSULE | ORAL | Status: DC
Start: 2019-11-09 — End: 2019-11-09

## 2019-11-09 MED ORDER — GENERIC EXTERNAL MEDICATION
250.00 | Status: DC
Start: 2019-11-09 — End: 2019-11-09

## 2019-11-16 IMAGING — CT CT HEAD W/O CM
3 series · 16 of 47 positions shown, 19 images · non-contrast
Comparison: 04/28/2018

CLINICAL DATA: Acute encephalopathy with intermittent agitation.

EXAM:
CT HEAD WITHOUT CONTRAST
TECHNIQUE: Contiguous axial images were obtained from the base of the skull
through the vertex without intravenous contrast.

[Series 2: head wo · axial · 0.47mm/px · z∈[-157,-32]mm · 10 of 31 slices shown, 13 images]
[im 3/31  brain]
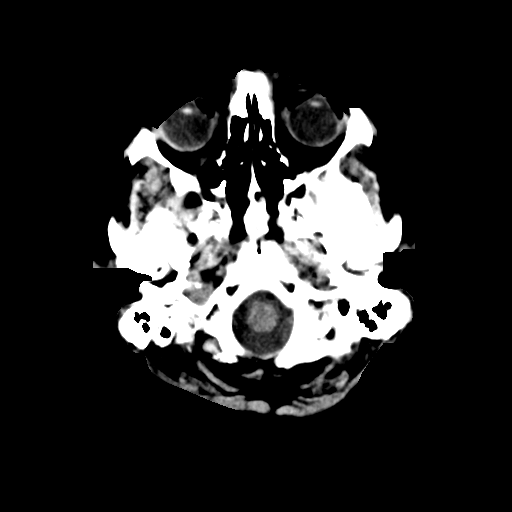
[im 3/31  bone]
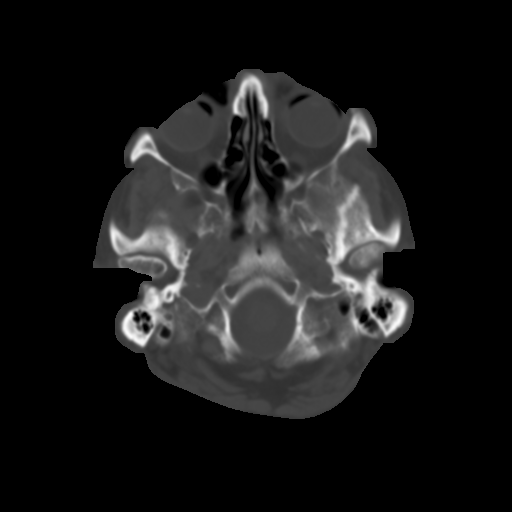
[im 6/31  brain]
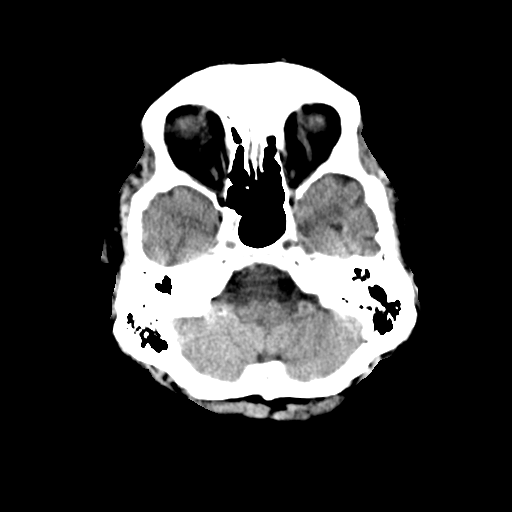
[im 9/31  brain]
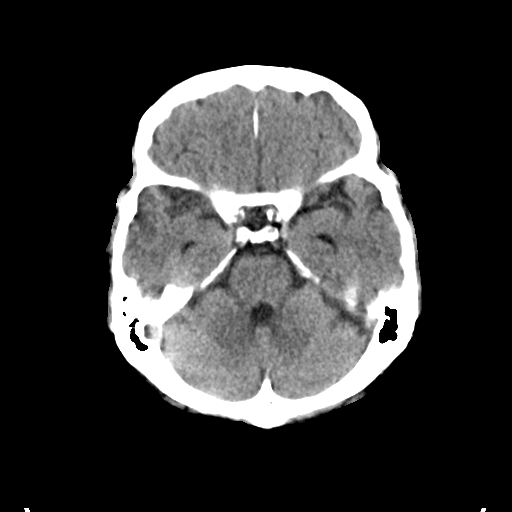
[im 11/31  brain]
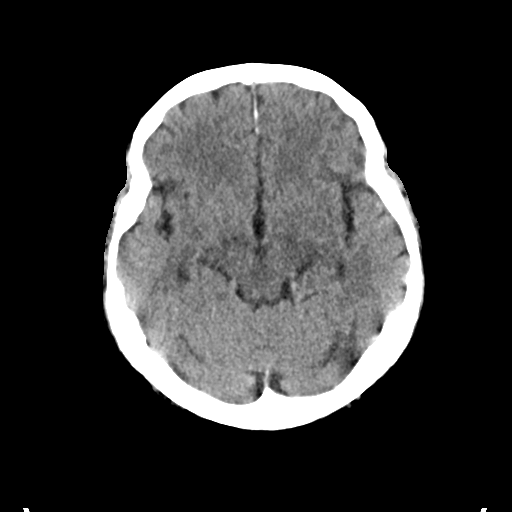
[im 14/31  brain]
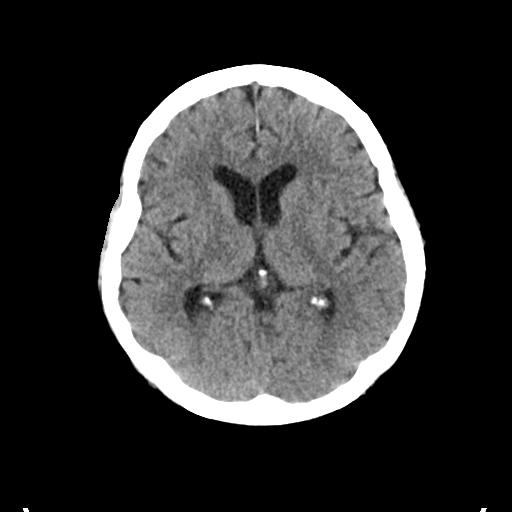
[im 14/31  bone]
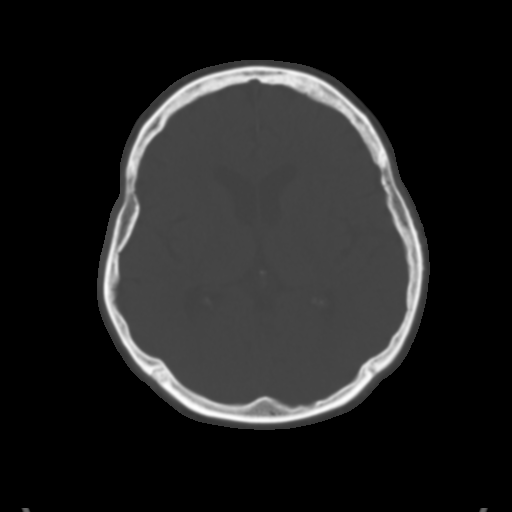
[im 17/31  brain]
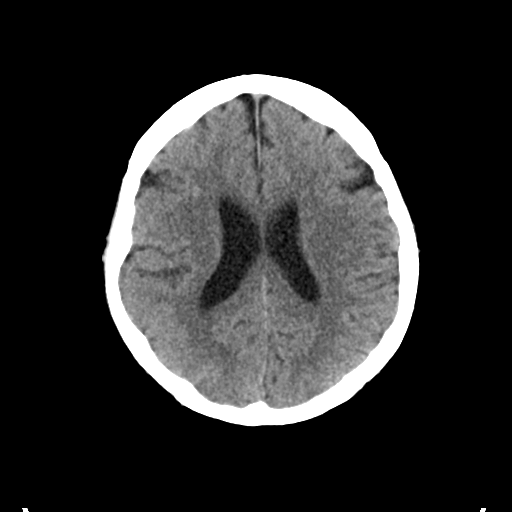
[im 20/31  brain]
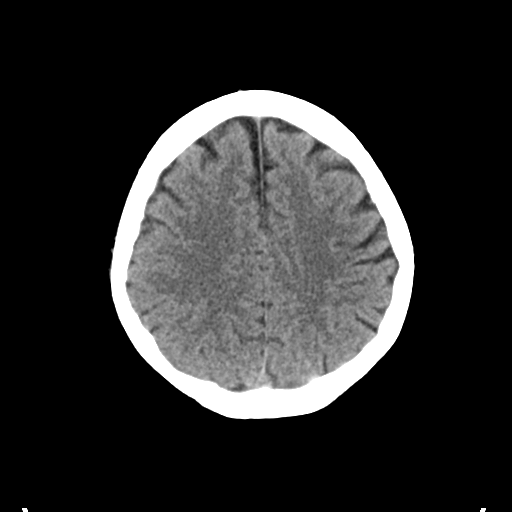
[im 23/31  brain]
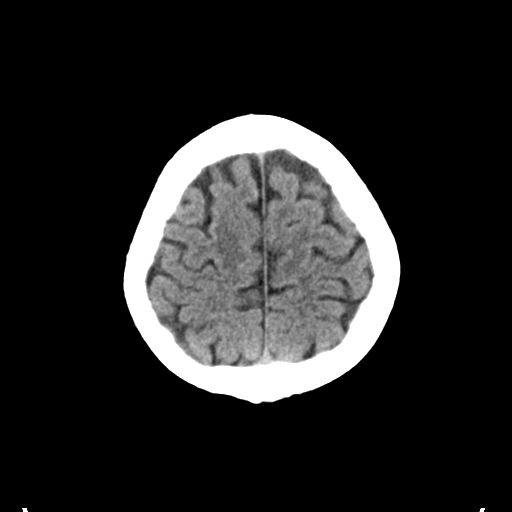
[im 25/31  brain]
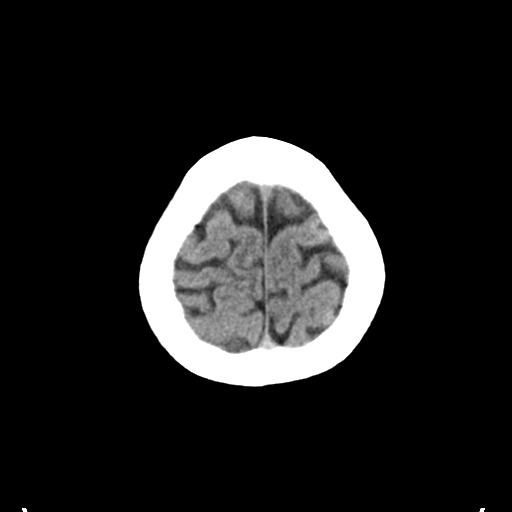
[im 25/31  bone]
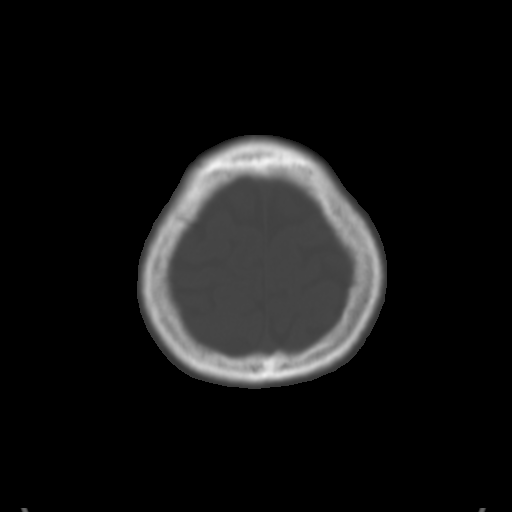
[im 28/31  brain]
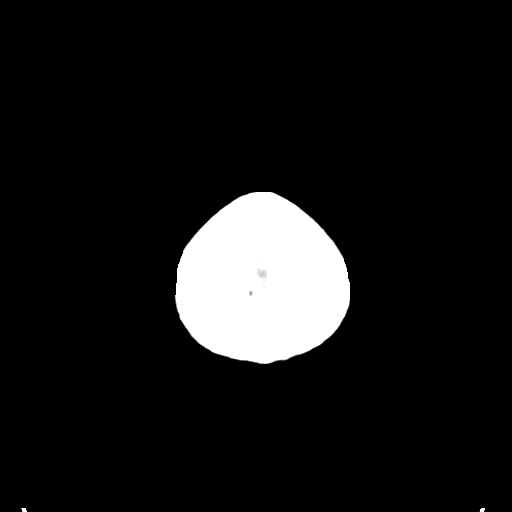

[Series 4: coronal soft tissue · coronal · 0.30mm/px · 3 of 62 slices shown]
[im 21/62  brain]
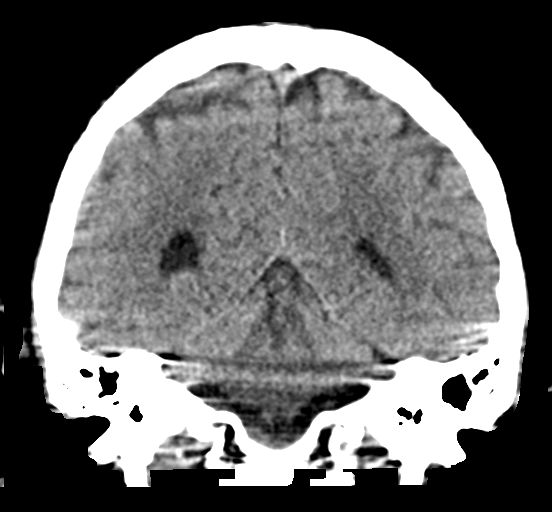
[im 28/62  brain]
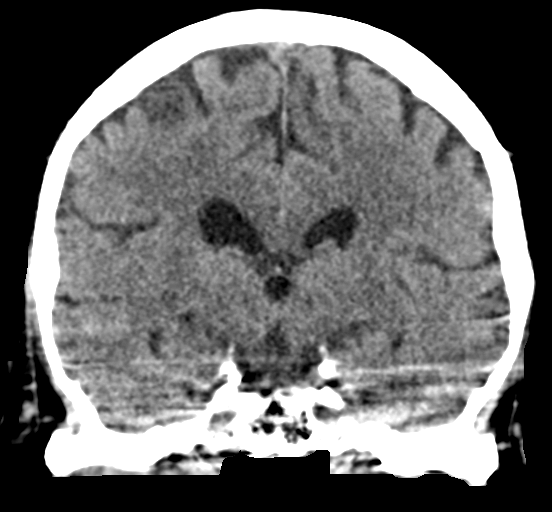
[im 34/62  brain]
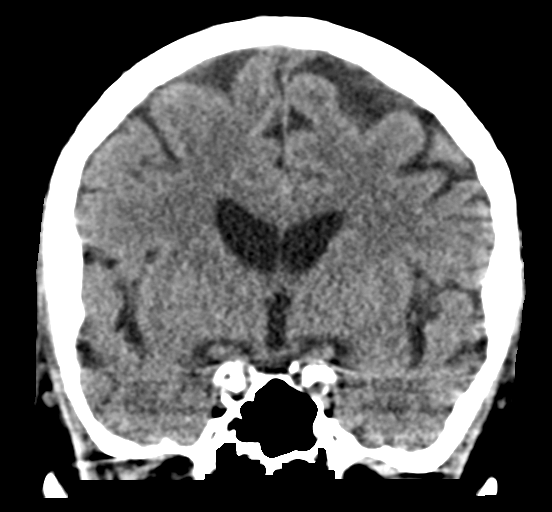

[Series 5: sagittal soft tissue · sagittal · 0.29mm/px · 3 of 62 slices shown]
[im 21/62  brain]
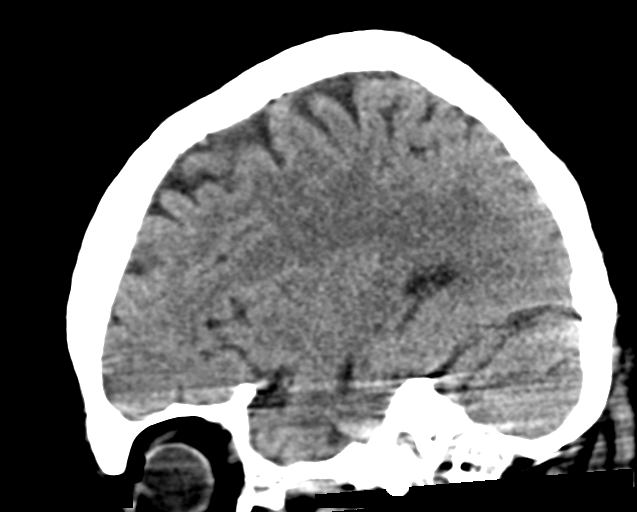
[im 31/62  brain]
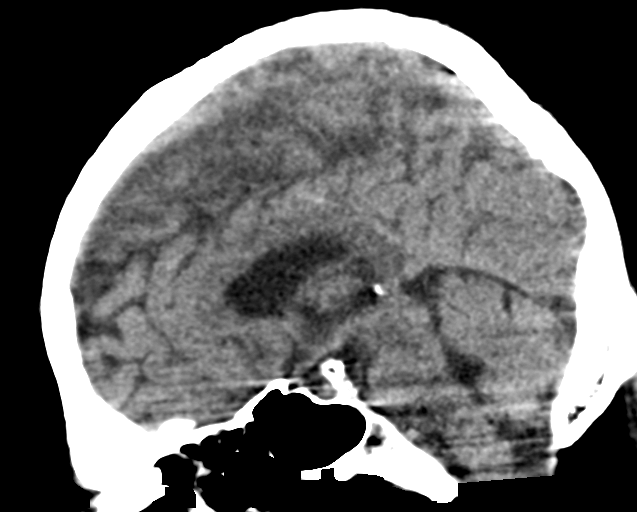
[im 41/62  brain]
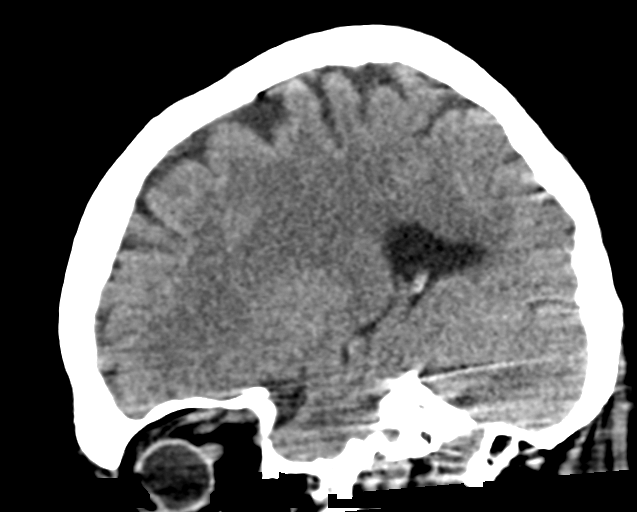

[16 of 47 positions shown; findings below may reference images not displayed]

FINDINGS: Brain: There is no evidence of acute infarct, intracranial
hemorrhage, mass, midline shift, or extra-axial fluid collection.
The ventricles and sulci are within normal limits for age.

Vascular: Calcified atherosclerosis at the skull base. No hyperdense
vessel.

Skull: No fracture or focal osseous lesion.

Sinuses/Orbits: Bilateral cataract extraction. Visualized paranasal
sinuses and mastoid air cells are clear.

Other: None.
IMPRESSION: No evidence of acute intracranial abnormality.

## 2019-12-15 ENCOUNTER — Encounter: Payer: Self-pay | Admitting: Emergency Medicine

## 2019-12-15 ENCOUNTER — Emergency Department: Payer: Medicare Other

## 2019-12-15 ENCOUNTER — Observation Stay
Admission: EM | Admit: 2019-12-15 | Discharge: 2019-12-16 | Disposition: A | Discharge status: Home / Self Care | Payer: Medicare Other | Attending: Internal Medicine | Admitting: Internal Medicine

## 2019-12-15 ENCOUNTER — Other Ambulatory Visit: Payer: Self-pay

## 2019-12-15 DIAGNOSIS — M6281 Muscle weakness (generalized): Secondary | ICD-10-CM | POA: Insufficient documentation

## 2019-12-15 DIAGNOSIS — R2689 Other abnormalities of gait and mobility: Secondary | ICD-10-CM | POA: Diagnosis not present

## 2019-12-15 DIAGNOSIS — Z95810 Presence of automatic (implantable) cardiac defibrillator: Secondary | ICD-10-CM | POA: Insufficient documentation

## 2019-12-15 DIAGNOSIS — Z66 Do not resuscitate: Secondary | ICD-10-CM | POA: Insufficient documentation

## 2019-12-15 DIAGNOSIS — I130 Hypertensive heart and chronic kidney disease with heart failure and stage 1 through stage 4 chronic kidney disease, or unspecified chronic kidney disease: Secondary | ICD-10-CM | POA: Diagnosis not present

## 2019-12-15 DIAGNOSIS — I951 Orthostatic hypotension: Secondary | ICD-10-CM | POA: Diagnosis not present

## 2019-12-15 DIAGNOSIS — I9589 Other hypotension: Secondary | ICD-10-CM | POA: Diagnosis not present

## 2019-12-15 DIAGNOSIS — I255 Ischemic cardiomyopathy: Secondary | ICD-10-CM | POA: Insufficient documentation

## 2019-12-15 DIAGNOSIS — I714 Abdominal aortic aneurysm, without rupture: Secondary | ICD-10-CM | POA: Diagnosis not present

## 2019-12-15 DIAGNOSIS — Z7902 Long term (current) use of antithrombotics/antiplatelets: Secondary | ICD-10-CM | POA: Insufficient documentation

## 2019-12-15 DIAGNOSIS — E869 Volume depletion, unspecified: Principal | ICD-10-CM | POA: Diagnosis present

## 2019-12-15 DIAGNOSIS — N183 Chronic kidney disease, stage 3 unspecified: Secondary | ICD-10-CM | POA: Diagnosis present

## 2019-12-15 DIAGNOSIS — I959 Hypotension, unspecified: Secondary | ICD-10-CM | POA: Diagnosis present

## 2019-12-15 DIAGNOSIS — Z8546 Personal history of malignant neoplasm of prostate: Secondary | ICD-10-CM | POA: Diagnosis not present

## 2019-12-15 DIAGNOSIS — Z20822 Contact with and (suspected) exposure to covid-19: Secondary | ICD-10-CM | POA: Diagnosis not present

## 2019-12-15 DIAGNOSIS — I5042 Chronic combined systolic (congestive) and diastolic (congestive) heart failure: Secondary | ICD-10-CM | POA: Diagnosis not present

## 2019-12-15 DIAGNOSIS — I480 Paroxysmal atrial fibrillation: Secondary | ICD-10-CM | POA: Diagnosis not present

## 2019-12-15 DIAGNOSIS — Z87891 Personal history of nicotine dependence: Secondary | ICD-10-CM | POA: Insufficient documentation

## 2019-12-15 DIAGNOSIS — I2510 Atherosclerotic heart disease of native coronary artery without angina pectoris: Secondary | ICD-10-CM | POA: Insufficient documentation

## 2019-12-15 DIAGNOSIS — Z79899 Other long term (current) drug therapy: Secondary | ICD-10-CM | POA: Insufficient documentation

## 2019-12-15 DIAGNOSIS — E785 Hyperlipidemia, unspecified: Secondary | ICD-10-CM | POA: Diagnosis present

## 2019-12-15 DIAGNOSIS — E861 Hypovolemia: Secondary | ICD-10-CM

## 2019-12-15 DIAGNOSIS — N1832 Chronic kidney disease, stage 3b: Secondary | ICD-10-CM | POA: Diagnosis not present

## 2019-12-15 DIAGNOSIS — E860 Dehydration: Secondary | ICD-10-CM

## 2019-12-15 LAB — CBC
HCT: 32.2 % — ABNORMAL LOW (ref 39.0–52.0)
Hemoglobin: 10.4 g/dL — ABNORMAL LOW (ref 13.0–17.0)
MCH: 29.5 pg (ref 26.0–34.0)
MCHC: 32.3 g/dL (ref 30.0–36.0)
MCV: 91.5 fL (ref 80.0–100.0)
Platelets: 257 10*3/uL (ref 150–400)
RBC: 3.52 MIL/uL — ABNORMAL LOW (ref 4.22–5.81)
RDW: 15 % (ref 11.5–15.5)
WBC: 9 10*3/uL (ref 4.0–10.5)
nRBC: 0 % (ref 0.0–0.2)

## 2019-12-15 LAB — TROPONIN I (HIGH SENSITIVITY)
Troponin I (High Sensitivity): 10 ng/L (ref <18)
Troponin I (High Sensitivity): 9 ng/L (ref <18)

## 2019-12-15 LAB — BASIC METABOLIC PANEL
Anion gap: 9 (ref 5–15)
BUN: 25 mg/dL — ABNORMAL HIGH (ref 8–23)
CO2: 21 mmol/L — ABNORMAL LOW (ref 22–32)
Calcium: 9.1 mg/dL (ref 8.9–10.3)
Chloride: 103 mmol/L (ref 98–111)
Creatinine, Ser: 2.04 mg/dL — ABNORMAL HIGH (ref 0.61–1.24)
GFR calc Af Amer: 35 mL/min — ABNORMAL LOW (ref >60)
GFR calc non Af Amer: 31 mL/min — ABNORMAL LOW (ref >60)
Glucose, Bld: 128 mg/dL — ABNORMAL HIGH (ref 70–99)
Potassium: 4.3 mmol/L (ref 3.5–5.1)
Sodium: 133 mmol/L — ABNORMAL LOW (ref 135–145)

## 2019-12-15 LAB — SARS CORONAVIRUS 2 BY RT PCR (HOSPITAL ORDER, PERFORMED IN ~~LOC~~ HOSPITAL LAB): SARS Coronavirus 2: NEGATIVE

## 2019-12-15 MED ORDER — CLOPIDOGREL BISULFATE 75 MG PO TABS
75.0000 mg | ORAL_TABLET | Freq: Every day | ORAL | Status: DC
  Administered 2019-12-15 – 2019-12-16 (×2): 75 mg via ORAL
  Filled 2019-12-15 (×2): qty 1

## 2019-12-15 MED ORDER — GABAPENTIN 300 MG PO CAPS
300.0000 mg | ORAL_CAPSULE | Freq: Three times a day (TID) | ORAL | Status: DC
  Administered 2019-12-15 – 2019-12-16 (×2): 300 mg via ORAL
  Filled 2019-12-15 (×2): qty 1

## 2019-12-15 MED ORDER — GUAIFENESIN ER 600 MG PO TB12
600.0000 mg | ORAL_TABLET | Freq: Two times a day (BID) | ORAL | Status: DC
  Administered 2019-12-15 – 2019-12-16 (×2): 600 mg via ORAL
  Filled 2019-12-15 (×2): qty 1

## 2019-12-15 MED ORDER — SODIUM CHLORIDE 0.9 % IV SOLN: INTRAVENOUS | Status: AC

## 2019-12-15 MED ORDER — ONDANSETRON HCL 4 MG PO TABS: 4.0000 mg | ORAL_TABLET | Freq: Four times a day (QID) | ORAL | Status: DC | PRN

## 2019-12-15 MED ORDER — ACETAMINOPHEN 325 MG PO TABS
650.0000 mg | ORAL_TABLET | Freq: Four times a day (QID) | ORAL | Status: DC | PRN
  Filled 2019-12-15: qty 2

## 2019-12-15 MED ORDER — HEPARIN SODIUM (PORCINE) 5000 UNIT/ML IJ SOLN
5000.0000 [IU] | Freq: Three times a day (TID) | INTRAMUSCULAR | Status: DC
  Administered 2019-12-16: 5000 [IU] via SUBCUTANEOUS
  Filled 2019-12-15: qty 1

## 2019-12-15 MED ORDER — ONDANSETRON HCL 4 MG/2ML IJ SOLN: 4.0000 mg | Freq: Four times a day (QID) | INTRAMUSCULAR | Status: DC | PRN

## 2019-12-15 MED ORDER — POLYETHYLENE GLYCOL 3350 17 G PO PACK: 17.0000 g | PACK | Freq: Every day | ORAL | Status: DC | PRN

## 2019-12-15 MED ORDER — MELATONIN 5 MG PO TABS: 2.5000 mg | ORAL_TABLET | Freq: Every evening | ORAL | Status: DC | PRN

## 2019-12-15 MED ORDER — ALBUTEROL SULFATE (2.5 MG/3ML) 0.083% IN NEBU: 2.5000 mg | INHALATION_SOLUTION | RESPIRATORY_TRACT | Status: DC | PRN

## 2019-12-15 MED ORDER — BISACODYL 10 MG RE SUPP
10.0000 mg | Freq: Every day | RECTAL | Status: DC | PRN
  Filled 2019-12-15: qty 1

## 2019-12-15 MED ORDER — ACETAMINOPHEN 650 MG RE SUPP: 650.0000 mg | Freq: Four times a day (QID) | RECTAL | Status: DC | PRN

## 2019-12-15 MED ORDER — SODIUM CHLORIDE 0.9 % IV BOLUS: 1000.0000 mL | Freq: Once | INTRAVENOUS | Status: DC

## 2019-12-15 MED ORDER — DOCUSATE SODIUM 100 MG PO CAPS
100.0000 mg | ORAL_CAPSULE | Freq: Two times a day (BID) | ORAL | Status: DC
  Administered 2019-12-15 – 2019-12-16 (×2): 100 mg via ORAL
  Filled 2019-12-15 (×2): qty 1

## 2019-12-15 MED ORDER — PANTOPRAZOLE SODIUM 40 MG PO TBEC
40.0000 mg | DELAYED_RELEASE_TABLET | Freq: Every day | ORAL | Status: DC
  Administered 2019-12-15 – 2019-12-16 (×2): 40 mg via ORAL
  Filled 2019-12-15 (×2): qty 1

## 2019-12-15 NOTE — ED Notes (Signed)
Pt states that he came in due to being dehydrated

## 2019-12-15 NOTE — H&P (Signed)
.  [facility] History and Physical  Matthew Zamora KDT:[id number] DOB: 1941-02-07 DOA: 12/15/2019  Referring physician: ED  PCP: [place] Pink, MD   Patient is coming from: home  Chief Complaint: Generalized weakness  HPI: Matthew Zamora is a 79 y.o. male with past medical history of ischemic cardiomyopathy, congestive heart failure last known ejection fraction of 20 to 25%, status post AICD, abdominal aortic aneurysm, anemia, hypertension, hyperlipidemia, paroxysmal atrial fibrillation, prostate cancer was brought in the hospital with complaints of generalized weakness.  Patient was seen by EMS noted that his systolic blood pressure was in the 70s.  Patient however denied any dizziness or syncope.  Denies any chest pain, palpitation, cough, shortness of breath.  Denies any urinary urgency, frequency or dysuria.  Denies any nausea, vomiting abdominal pain, constipation or diarrhea.  Patient denies any stuffy nose, runny nose or sore throat.  No fever, chills or rigor.    ED Course: In the ED, patient was noted to be hypotensive and received IV fluid with improvement in his blood pressure.  He was also noted to be orthostatic.  He appeared to be clinically volume depleted.  Due to his history of significant cardiomyopathy, patient was considered for observation in hospital overnight.  X-ray of the chest done in the ED showed no evidence of infiltrate.  Troponin first set in the ED was negative.  Sodium was 133, creatinine of 2.04.  Creatinine a month back was 2.3.   Review of Systems:  All systems were reviewed and were negative unless otherwise mentioned in the HPI  Past Medical History:  Diagnosis Date  . Abdominal aortic aneurysm (AAA) ([place]) 05/13/15   seen on ct scan  . 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 Anemia   . 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 CAD (coronary artery disease)   . Cataract cortical, senile   . CHF (congestive heart  failure) ([place])   . Chronic hoarseness   . Exocrine pancreatic insufficiency   . H. pylori infection   . History of hepatitis   . Hyperlipidemia   . Hypertension   . 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 PAF (paroxysmal atrial fibrillation) ([place])   . Prostate CA [facility])    Past Surgical History:  Procedure Laterality Date  . CATARACT EXTRACTION    . COLONOSCOPY  10/06/2014, 09/18/2004, 03/14/2009  . ESOPHAGOGASTRODUODENOSCOPY  10/06/2014, 03/18/2001, 03/14/2009  . ESOPHAGOGASTRODUODENOSCOPY (EGD) WITH PROPOFOL N/A 05/07/2018   Procedure: ESOPHAGOGASTRODUODENOSCOPY (EGD) WITH PROPOFOL;  Surgeon: Toledo, [name], MD;  Location: [facility] ENDOSCOPY;  Service: Gastroenterology;  Laterality: N/A;  . FLEXIBLE SIGMOIDOSCOPY  08/26/1990  . INSERTION OF ICD    . PROSTATE SURGERY    . TONSILLECTOMY      Social History:  reports that he quit smoking about 20 years ago. His smoking use included cigarettes. He has a 38.00 pack-year smoking history. He has never used smokeless tobacco. He reports previous drug use. He reports that he does not drink alcohol.  No Known Allergies  Family History  Problem Relation Age of Onset  . Heart attack Mother   . Heart attack Father      Prior to Admission medications   Medication Sig Start Date End Date Taking? Authorizing Provider  acetaminophen (TYLENOL) 325 MG tablet Take 2 tablets (650 mg total) by mouth every 4 (four) hours as needed for headache or mild pain. 10/29/19   Darel Hong D, NP  albuterol (PROVENTIL) (2.5 MG/3ML) 0.083% nebulizer solution Inhale 3 mLs (2.5 mg  total) into the lungs as needed for shortness of breath. 10/29/19   Bradly Bienenstock, NP  amiodarone (PACERONE) 400 MG tablet Take 1 tablet (400 mg total) by mouth 2 (two) times daily. 10/29/19   Bradly Bienenstock, NP  Chlorhexidine Gluconate Cloth 2 % PADS Apply 6 each topically daily. 10/29/19   Bradly Bienenstock, NP  clopidogrel (PLAVIX) 75 MG tablet Take 1 tablet (75 mg total) by mouth daily. 10/29/19    Bradly Bienenstock, NP  furosemide (LASIX) 10 MG/ML injection Inject 4 mLs (40 mg total) into the vein every 12 (twelve) hours. 10/29/19   Bradly Bienenstock, NP  gabapentin (NEURONTIN) 300 MG capsule Take 1 capsule (300 mg total) by mouth 3 (three) times daily. 10/29/19   Bradly Bienenstock, NP  Heparin Lock Flush (HEPARIN FLUSH) 10 UNIT/ML SOLN injection 2 mLs (20 Units total) by Intracatheter route every 12 (twelve) hours. 10/29/19   Bradly Bienenstock, NP  Heparin Lock Flush (HEPARIN FLUSH) 10 UNIT/ML SOLN injection 2 mLs (20 Units total) by Intracatheter route as needed (line care). 10/29/19   Darel Hong D, NP  ipratropium-albuterol (DUONEB) 0.5-2.5 (3) MG/3ML SOLN Take 3 mLs by nebulization 3 (three) times daily. 10/29/19   Bradly Bienenstock, NP  magnesium chloride (SLOW-MAG) 64 MG TBEC SR tablet Take 1 tablet (64 mg total) by mouth daily. 10/29/19   Bradly Bienenstock, NP  melatonin 5 MG TABS Take 0.5 tablets (2.5 mg total) by mouth at bedtime as needed (sleep). 10/29/19   Bradly Bienenstock, NP  Multiple Vitamin (MULTIVITAMIN WITH MINERALS) TABS tablet Take 1 tablet by mouth daily. 10/29/19   Darel Hong D, NP  norepinephrine (LEVOPHED) 4-5 MG/250ML-% SOLN Inject 0-40 mcg/min into the vein continuous. 10/29/19   Bradly Bienenstock, NP  ondansetron (ZOFRAN) 4 MG/2ML SOLN injection Inject 2 mLs (4 mg total) into the vein every 6 (six) hours as needed for nausea. 10/29/19   Bradly Bienenstock, NP  pantoprazole (PROTONIX) 40 MG tablet Take 1 tablet (40 mg total) by mouth daily. 10/29/19   Darel Hong D, NP  sodium chloride 0.9 % infusion Inject 250 mLs into the vein continuous. 10/29/19   Darel Hong D, NP  sodium chloride flush (NS) 0.9 % SOLN 10-40 mLs by Intracatheter route every 12 (twelve) hours. 10/29/19   Bradly Bienenstock, NP    Physical Exam: Vitals:   12/15/19 1453 12/15/19 1547 12/15/19 1639  BP: (!) 86/65 (!) 87/58 107/69  Pulse: 92 91 84  Resp: 18 15 11   Temp: 97.6 F (36.4 C)     TempSrc: Oral    SpO2: 99% 99% 99%  Weight: 64.9 kg    Height: 5\' 9"  (1.753 m)     Wt Readings from Last 3 Encounters:  12/15/19 64.9 kg  10/26/19 78.6 kg  10/18/19 80.3 kg   Body mass index is 21.12 kg/m.  General:  Average built, not in obvious distress, thinly built HENT: Normocephalic, pupils equally reacting to light and accommodation.  No scleral pallor or icterus noted. Oral mucosa is moist.  Chest:  Clear breath sounds.  Diminished breath sounds bilaterally. No crackles or wheezes.  Left chest wall AICD in place. CVS: S1 &S2 heard.  Regular rhythm at this time Abdomen: Soft, nontender, nondistended.  Bowel sounds are heard.  Liver is not palpable, no abdominal mass palpated Extremities: No cyanosis, clubbing or edema.  Peripheral pulses are palpable. Psych: Alert, awake and oriented, normal mood CNS:  No cranial nerve  deficits.  Power equal in all extremities. .    Skin: Warm and dry.  No rashes noted.  Labs on Admission:   CBC: Recent Labs  Lab 12/15/19 1515  WBC 9.0  HGB 10.4*  HCT 32.2*  MCV 91.5  PLT 299    Basic Metabolic Panel: Recent Labs  Lab 12/15/19 1515  NA 133*  K 4.3  CL 103  CO2 21*  GLUCOSE 128*  BUN 25*  CREATININE 2.04*  CALCIUM 9.1      Radiological Exams on Admission: DG Chest Portable 1 View  Result Date: 12/15/2019 CLINICAL DATA:  Hypotension.  Dyspnea. EXAM: PORTABLE CHEST 1 VIEW COMPARISON:  Chest x-ray dated 10/28/2019 FINDINGS: AICD in place. Heart size and pulmonary vascularity are normal. No infiltrates or effusions. Clearing of the haziness at the left lung base since the prior study. No acute bone abnormality. IMPRESSION: No acute abnormalities. Electronically Signed   By: Lorriane Shire M.D.   On: 12/15/2019 16:30    EKG: Personally reviewed by me which shows normal sinus rhythm  Assessment/Plan Principal Problem:   Volume depletion Active Problems:   Paroxysmal atrial fibrillation ([name])   Hyperlipidemia   CKD  (chronic kidney disease) stage 3, GFR 30-59 ml/min   Hypotension   Generalized weakness likely secondary to volume depletion from overdiuresis.  Patient was clinically volume depleted with orthostatic hypotension.  Received IV fluid in the ED will continue overnight.  Reassess for IV fluid in the morning.  Hold diuretics.  History of ischemic cardiomyopathy with ejection fraction 20 to 25%.  Not in acute heart failure at this time.  Watch closely with IV fluids.  Compensated.  Chest x-ray without infiltrate.  Paroxysmal atrial fibrillation.  Currently on normal sinus rhythm.  Awaiting medication reconciliation.  CKD stage IIIb.  Creatinine 1 month back similar to creatinine this time.  Likely at baseline.  History of hypertension.  Hold off with any antihypertensives at this time.  DVT Prophylaxis: Heparin subcu.  Await med rec  Consultant: None  Code Status: DO NOT RESUSCITATE  Microbiology none  Antibiotics: None  Family Communication:  Patients' condition and plan of care including tests being ordered have been discussed with the patient and the patient's son Mr. Juanda Crumble who indicate understanding and agree with the plan.   Severity of Illness: The appropriate patient status for this patient is OBSERVATION. Observation status is judged to be reasonable and necessary in order to provide the required intensity of service to ensure the patient's safety. The patient's presenting symptoms, physical exam findings, and initial radiographic and laboratory data in the context of their medical condition is felt to place them at decreased risk for further clinical deterioration. Furthermore, it is anticipated that the patient will be medically stable for discharge from the hospital within 2 midnights of admission.    Signed, Flora Lipps, MD [facility] 12/15/2019

## 2019-12-15 NOTE — ED Notes (Signed)
Pt taken to floor via stretcher with nurse tech

## 2019-12-15 NOTE — ED Triage Notes (Signed)
Patient from home via [facility]. Patient was being seen by PT when he was found to be hypotensive. Patient pressure improved when laying down. Patient has lost approx 30 lb in the last month. Taking multiple BP meds and family thinks they may need to be adjusted based on his new weight.

## 2019-12-15 NOTE — ED Provider Notes (Signed)
[facility] Emergency Department Provider Note  ____________________________________________   First MD Initiated Contact with Patient 12/15/19 1506     (approximate)  I have reviewed the triage vital signs and the nursing notes.   HISTORY  Chief Complaint Hypotension and Weakness    HPI Matthew Zamora is a 79 y.o. male with below list of previous medical conditions including coronary artery disease status post PCI x3 to the RCA, abdominal aortic aneurysm hypertension, congestive heart failure with an ejection fraction of 20 to 25% status post AICD placement at [facility], chronic kidney disease chronic atrial fibrillation and anemia presents to the emergency department secondary to hypotension and generalized weakness.  Patient presents via EMS with reported blood pressure of 70 systolic while patient was receiving physical therapy today.  Patient is admit to generalized weakness he denies any chest pain no shortness of breath no diarrhea or vomiting or constipation.  Patient denies any abdominal discomfort on arrival to the emergency department the patient's blood pressure was 86/65.        Past Medical History:  Diagnosis Date  . Abdominal aortic aneurysm (AAA) ([place]) 05/13/15   seen on ct scan  . 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 Anemia   . 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 CAD (coronary artery disease)   . Cataract cortical, senile   . CHF (congestive heart failure) ([place])   . Chronic hoarseness   . Exocrine pancreatic insufficiency   . H. pylori infection   . History of hepatitis   . Hyperlipidemia   . Hypertension   . 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 PAF (paroxysmal atrial fibrillation) ([place])   . Prostate CA [facility])     Patient Active Problem List   Diagnosis Date Noted  . Volume depletion 12/15/2019  . Hypotension 12/15/2019  . Cardiorenal syndrome   . Atrial fibrillation with RVR ([place])   . Defibrillator discharge    . Tachycardia 10/25/2019  . CHF (congestive heart failure) ([place]) 09/18/2019  . Acute on chronic combined systolic and diastolic CHF (congestive heart failure) ([place]) 09/17/2019  . CAD (coronary artery disease) 09/17/2019  . Ischemic cardiomyopathy 09/17/2019  . CKD (chronic kidney disease) stage 3, GFR 30-59 ml/min 09/17/2019  . Acute hypoxemic respiratory failure ([place]) 09/17/2018  . AAA (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 ([place]) 07/06/2018  . Essential hypertension 07/06/2018  . Hyperlipidemia 07/06/2018  . Paroxysmal atrial fibrillation ([name])   . Acute on chronic respiratory failure with hypoxia ([place]) 04/26/2018    Past Surgical History:  Procedure Laterality Date  . CATARACT EXTRACTION    . COLONOSCOPY  10/06/2014, 09/18/2004, 03/14/2009  . ESOPHAGOGASTRODUODENOSCOPY  10/06/2014, 03/18/2001, 03/14/2009  . ESOPHAGOGASTRODUODENOSCOPY (EGD) WITH PROPOFOL N/A 05/07/2018   Procedure: ESOPHAGOGASTRODUODENOSCOPY (EGD) WITH PROPOFOL;  Surgeon: Toledo, [name], MD;  Location: [facility] ENDOSCOPY;  Service: Gastroenterology;  Laterality: N/A;  . FLEXIBLE SIGMOIDOSCOPY  08/26/1990  . INSERTION OF ICD    . PROSTATE SURGERY    . TONSILLECTOMY      Prior to Admission medications   Medication Sig Start Date End Date Taking? Authorizing Provider  acetaminophen (TYLENOL) 325 MG tablet Take 2 tablets (650 mg total) by mouth every 4 (four) hours as needed for headache or mild pain. 10/29/19   Bradly Bienenstock, NP  albuterol (PROVENTIL) (2.5 MG/3ML) 0.083% nebulizer solution Inhale 3 mLs (2.5 mg total) into the lungs as needed for shortness of breath. 10/29/19   Bradly Bienenstock, NP  amiodarone (PACERONE) 400 MG tablet Take  1 tablet (400 mg total) by mouth 2 (two) times daily. 10/29/19   Bradly Bienenstock, NP  Chlorhexidine Gluconate Cloth 2 % PADS Apply 6 each topically daily. 10/29/19   Bradly Bienenstock, NP  clopidogrel (PLAVIX) 75 MG tablet Take 1 tablet (75 mg total) by mouth daily. 10/29/19    Bradly Bienenstock, NP  furosemide (LASIX) 10 MG/ML injection Inject 4 mLs (40 mg total) into the vein every 12 (twelve) hours. 10/29/19   Bradly Bienenstock, NP  gabapentin (NEURONTIN) 300 MG capsule Take 1 capsule (300 mg total) by mouth 3 (three) times daily. 10/29/19   Bradly Bienenstock, NP  Heparin Lock Flush (HEPARIN FLUSH) 10 UNIT/ML SOLN injection 2 mLs (20 Units total) by Intracatheter route every 12 (twelve) hours. 10/29/19   Bradly Bienenstock, NP  Heparin Lock Flush (HEPARIN FLUSH) 10 UNIT/ML SOLN injection 2 mLs (20 Units total) by Intracatheter route as needed (line care). 10/29/19   Darel Hong D, NP  ipratropium-albuterol (DUONEB) 0.5-2.5 (3) MG/3ML SOLN Take 3 mLs by nebulization 3 (three) times daily. 10/29/19   Bradly Bienenstock, NP  magnesium chloride (SLOW-MAG) 64 MG TBEC SR tablet Take 1 tablet (64 mg total) by mouth daily. 10/29/19   Bradly Bienenstock, NP  melatonin 5 MG TABS Take 0.5 tablets (2.5 mg total) by mouth at bedtime as needed (sleep). 10/29/19   Bradly Bienenstock, NP  Multiple Vitamin (MULTIVITAMIN WITH MINERALS) TABS tablet Take 1 tablet by mouth daily. 10/29/19   Darel Hong D, NP  norepinephrine (LEVOPHED) 4-5 MG/250ML-% SOLN Inject 0-40 mcg/min into the vein continuous. 10/29/19   Bradly Bienenstock, NP  ondansetron (ZOFRAN) 4 MG/2ML SOLN injection Inject 2 mLs (4 mg total) into the vein every 6 (six) hours as needed for nausea. 10/29/19   Bradly Bienenstock, NP  pantoprazole (PROTONIX) 40 MG tablet Take 1 tablet (40 mg total) by mouth daily. 10/29/19   Darel Hong D, NP  sodium chloride 0.9 % infusion Inject 250 mLs into the vein continuous. 10/29/19   Darel Hong D, NP  sodium chloride flush (NS) 0.9 % SOLN 10-40 mLs by Intracatheter route every 12 (twelve) hours. 10/29/19   Bradly Bienenstock, NP    Allergies Patient has no known allergies.  Family History  Problem Relation Age of Onset  . Heart attack Mother   . Heart attack Father     Social  History Social History   Tobacco Use  . Smoking status: Former Smoker    Packs/day: 1.00    Years: 38.00    Pack years: 38.00    Types: Cigarettes    Quit date: 06/18/1999    Years since quitting: 20.5  . Smokeless tobacco: Never Used  Substance Use Topics  . Alcohol use: No    Alcohol/week: 0.0 standard drinks  . Drug use: Not Currently    Review of Systems Constitutional: No fever/chills Eyes: No visual changes. ENT: No sore throat. Cardiovascular: Denies chest pain. Respiratory: Denies shortness of breath. Gastrointestinal: No abdominal pain.  No nausea, no vomiting.  No diarrhea.  No constipation. Genitourinary: Negative for dysuria. Musculoskeletal: Negative for neck pain.  Negative for back pain. Integumentary: Negative for rash. Neurological: Negative for headaches, focal weakness or numbness.  Positive for generalized weakness   ____________________________________________   PHYSICAL EXAM:  VITAL SIGNS: ED Triage Vitals [12/15/19 1453]  Enc Vitals Group     BP (!) 86/65     Pulse Rate 92     Resp  18     Temp 97.6 F (36.4 C)     Temp Source Oral     SpO2 99 %     Weight 64.9 kg (143 lb)     Height 1.753 m (5\' 9" )     Head Circumference      Peak Flow      Pain Score 0     Pain Loc      Pain Edu?      Excl. in [place]?     Constitutional: Alert and oriented.  Eyes: Conjunctivae are normal.  Head: Atraumatic. Mouth/Throat: Dry oral mucosa Neck: No stridor.  No meningeal signs.   Cardiovascular: Normal rate, regular rhythm. Good peripheral circulation. Grossly normal heart sounds. Respiratory: Normal respiratory effort.  No retractions. Gastrointestinal: Soft and nontender. No distention.  Musculoskeletal: No lower extremity tenderness nor edema. No gross deformities of extremities. Neurologic:  Normal speech and language. No gross focal neurologic deficits are appreciated.  Skin:  Skin is warm, dry and intact. Psychiatric: Mood and affect are normal.  Speech and behavior are normal.  ____________________________________________   LABS (all labs ordered are listed, but only abnormal results are displayed)  Labs Reviewed  BASIC METABOLIC PANEL - Abnormal; Notable for the following components:      Result Value   Sodium 133 (*)    CO2 21 (*)    Glucose, Bld 128 (*)    BUN 25 (*)    Creatinine, Ser 2.04 (*)    GFR calc non Af Amer 31 (*)    GFR calc Af Amer 35 (*)    All other components within normal limits  CBC - Abnormal; Notable for the following components:   RBC 3.52 (*)    Hemoglobin 10.4 (*)    HCT 32.2 (*)    All other components within normal limits  SARS CORONAVIRUS 2 BY RT PCR (HOSPITAL ORDER, [place] [facility])  URINALYSIS, COMPLETE (UACMP) WITH MICROSCOPIC  CBG MONITORING, ED  TROPONIN I (HIGH SENSITIVITY)  TROPONIN I (HIGH SENSITIVITY)   ____________________________________________  EKG  ED ECG REPORT I, [place] N Yardley Beltran, the attending physician, personally viewed and interpreted this ECG.   Date: 12/15/2019  EKG Time: 3:03 PM  Rate: 90  Rhythm: Normal sinus rhythm with left bundle branch block  Axis: Normal  Intervals: Prolonged PR  ST&T Change: None  ____________________________________________  RADIOLOGY I,  N Aliou Mealey, personally viewed and evaluated these images (plain radiographs) as part of my medical decision making, as well as reviewing the written report by the radiologist.  ED MD interpretation: No acute abnormalities on chest x-ray per radiologist  Official radiology report(s): DG Chest Portable 1 View  Result Date: 12/15/2019 CLINICAL DATA:  Hypotension.  Dyspnea. EXAM: PORTABLE CHEST 1 VIEW COMPARISON:  Chest x-ray dated 10/28/2019 FINDINGS: AICD in place. Heart size and pulmonary vascularity are normal. No infiltrates or effusions. Clearing of the haziness at the left lung base since the prior study. No acute bone abnormality. IMPRESSION: No acute  abnormalities. Electronically Signed   By: Lorriane Shire M.D.   On: 12/15/2019 16:30     Procedures   ____________________________________________   INITIAL IMPRESSION / MDM / ASSESSMENT AND PLAN / ED COURSE  As part of my medical decision making, I reviewed the following data within the [place] NUMBER  78 year old male present with above-stated history physical exam with differential diagnosis including but not limited to orthostatic hypotension secondary to dehydration, ACS, infectious etiology including UTI or  pneumonia.  Patient given 900 mL of IV normal saline over a 3-hour period with marked improvement in blood pressure current systolic 465.  Before doing so orthostatic vital signs were performed which did reveal that the patient was indeed orthostatic.  Laboratory data reveal a creatinine of 2.04 which is improved from the previous creatinine of 5.80 on 10/29/2019.  Patient has no complaints at present.  Patient discussed with hospitalist after hospital admission for further evaluation and management continue gentle IV hydration and medication management for home  ____________________________________________  FINAL CLINICAL IMPRESSION(S) / ED DIAGNOSES  Final diagnoses:  Orthostatic hypotension  Dehydration     MEDICATIONS GIVEN DURING THIS VISIT:  Medications  sodium chloride 0.9 % bolus 1,000 mL (0 mLs Intravenous Hold 12/15/19 1545)     ED Discharge Orders    None      *Please note:  Matthew Zamora was evaluated in Emergency Department on 12/15/2019 for the symptoms described in the history of present illness. He was evaluated in the context of the global COVID-19 pandemic, which necessitated consideration that the patient might be at risk for infection with the SARS-CoV-2 virus that causes COVID-19. Institutional protocols and algorithms that pertain to the evaluation of patients at risk for COVID-19 are in a state of rapid change based on information  released by regulatory bodies including the CDC and federal and state organizations. These policies and algorithms were followed during the patient's care in the ED.  Some ED evaluations and interventions may be delayed as a result of limited staffing during and after the pandemic.*  Note:  This document was prepared using Dragon voice recognition software and may include unintentional dictation errors.   Gregor Hams, MD 12/15/19 [phone]

## 2019-12-16 DIAGNOSIS — N1832 Chronic kidney disease, stage 3b: Secondary | ICD-10-CM | POA: Diagnosis not present

## 2019-12-16 DIAGNOSIS — I9589 Other hypotension: Secondary | ICD-10-CM | POA: Diagnosis not present

## 2019-12-16 DIAGNOSIS — E785 Hyperlipidemia, unspecified: Secondary | ICD-10-CM

## 2019-12-16 DIAGNOSIS — E869 Volume depletion, unspecified: Secondary | ICD-10-CM | POA: Diagnosis not present

## 2019-12-16 LAB — CBC
HCT: 29.8 % — ABNORMAL LOW (ref 39.0–52.0)
Hemoglobin: 9.6 g/dL — ABNORMAL LOW (ref 13.0–17.0)
MCH: 29.7 pg (ref 26.0–34.0)
MCHC: 32.2 g/dL (ref 30.0–36.0)
MCV: 92.3 fL (ref 80.0–100.0)
Platelets: 229 10*3/uL (ref 150–400)
RBC: 3.23 MIL/uL — ABNORMAL LOW (ref 4.22–5.81)
RDW: 15.1 % (ref 11.5–15.5)
WBC: 8.1 10*3/uL (ref 4.0–10.5)
nRBC: 0 % (ref 0.0–0.2)

## 2019-12-16 LAB — COMPREHENSIVE METABOLIC PANEL
ALT: 366 U/L — ABNORMAL HIGH (ref 0–44)
AST: 126 U/L — ABNORMAL HIGH (ref 15–41)
Albumin: 3.1 g/dL — ABNORMAL LOW (ref 3.5–5.0)
Alkaline Phosphatase: 101 U/L (ref 38–126)
Anion gap: 6 (ref 5–15)
BUN: 27 mg/dL — ABNORMAL HIGH (ref 8–23)
CO2: 24 mmol/L (ref 22–32)
Calcium: 9.2 mg/dL (ref 8.9–10.3)
Chloride: 106 mmol/L (ref 98–111)
Creatinine, Ser: 1.88 mg/dL — ABNORMAL HIGH (ref 0.61–1.24)
GFR calc Af Amer: 39 mL/min — ABNORMAL LOW (ref >60)
GFR calc non Af Amer: 34 mL/min — ABNORMAL LOW (ref >60)
Glucose, Bld: 93 mg/dL (ref 70–99)
Potassium: 4.9 mmol/L (ref 3.5–5.1)
Sodium: 136 mmol/L (ref 135–145)
Total Bilirubin: 0.7 mg/dL (ref 0.3–1.2)
Total Protein: 6.3 g/dL — ABNORMAL LOW (ref 6.5–8.1)

## 2019-12-16 MED ORDER — AMIODARONE HCL 400 MG PO TABS: 400.0000 mg | ORAL_TABLET | Freq: Every day | ORAL | Status: DC

## 2019-12-16 MED ORDER — CHLORHEXIDINE GLUCONATE CLOTH 2 % EX PADS
6.0000 | MEDICATED_PAD | Freq: Every day | CUTANEOUS | Status: DC
  Administered 2019-12-16: 6 via TOPICAL

## 2019-12-16 NOTE — Progress Notes (Signed)
OT Cancellation Note  Patient Details Name: Matthew Zamora MRN: [id number] DOB: 1940-12-28   Cancelled Treatment:    Reason Eval/Treat Not Completed: Patient at procedure or test/ unavailable. Thank you for the OT consult. Order received and chart reviewed. Upon arrival to unit, pt noted to be out of room. Per unit secretary, pt has [place]. Will complete orders at this time. Thank you.   Shara Blazing, M.S., OTR/L Ascom: [phone] 12/16/19, 1:52 PM

## 2019-12-16 NOTE — Discharge Summary (Signed)
Physician Discharge Summary  KAYLER RISE KPT:[id number] DOB: [birth year] DOA: 12/15/2019  PCP: Maryland Pink, MD  Admit date: 12/15/2019 Discharge date: 12/16/2019  Admitted From: Home  Discharge disposition: Home   Recommendations for Outpatient Follow-Up:   . Follow up with your primary care provider in one week.  . Check CBC, BMP in the next visit   Discharge Diagnosis:   Principal Problem:   Volume depletion Active Problems:   Paroxysmal atrial fibrillation ([name])   Hyperlipidemia   CKD (chronic kidney disease) stage 3, GFR 30-59 ml/min   Hypotension   Discharge Condition: Improved.  Diet recommendation: Low sodium, heart healthy.   Wound care: None.  Code status: DNR   History of Present Illness:   Matthew Zamora is a 79 y.o. male with past medical history of ischemic cardiomyopathy, congestive heart failure last known ejection fraction of 20 to 25%, status post AICD, abdominal aortic aneurysm, anemia, hypertension, hyperlipidemia, paroxysmal atrial fibrillation, prostate cancer was brought in the hospital with complaints of generalized weakness.  Patient was seen by EMS noted that his systolic blood pressure was in the 70s.  Patient however denied any dizziness or syncope.  In the ED, patient was noted to be hypotensive and received IV fluid with improvement in his blood pressure.  He was also noted to be orthostatic.  He appeared to be clinically volume depleted.  Due to his history of significant cardiomyopathy, patient was considered for observation in hospital overnight.  X-ray of the chest done in the ED showed no evidence of infiltrate.  Troponin first set in the ED was negative.  Sodium was 133, creatinine of 2.04.  Creatinine a month back was 2.3.   Hospital Course:   Following conditions were addressed during hospitalization as listed below,  Generalized weakness likely secondary to volume depletion from overdiuresis.  Patient was clinically  volume depleted with orthostatic hypotension.  Received IV fluid in the ED and received IV fluids overnight.  Patient feels much better after hydration.  He did not have orthostatic signs like dizziness or lightheadedness on ambulation.  History of ischemic cardiomyopathy with ejection fraction 20 to 25%.   Compensated.  Chest x-ray without infiltrate.  Paroxysmal atrial fibrillation.    Remained on normal sinus rhythm.    Patient was on amiodarone and mexiletine as outpatient.  Looked at the cardiology follow-up a month back at the [facility] when he was on mexiletine 3 times a day and amiodarone 400 daily.  Has been resumed on discharge.  CKD stage IIIb.  Creatinine 1 month back similar to creatinine this time.  Likely at baseline at this time.[name]  History of hypertension.    Was hypotensive on presentation which improved IV fluids.  Metoprolol will be resumed on discharge.  Disposition.  At this time, patient is stable for disposition home.  Spoke with the patient's son Mr. Juanda Crumble yesterday regarding the plan for disposition today.  Medical Consultants:    None.  Procedures:    None Subjective:   Today, patient feels better and energetic.  Denies any dizziness lightheadedness chest pain or shortness of breath.  Wishes to go home.  Discharge Exam:   Vitals:   12/16/19 0809 12/16/19 1136  BP: 111/79 (!) 111/97  Pulse: 93 95  Resp: 19 17  Temp:  97.6 F (36.4 C)  SpO2: 97% 100%   Vitals:   12/16/19 0718 12/16/19 0807 12/16/19 0809 12/16/19 1136  BP: 129/85 119/78 111/79 (!) 111/97  Pulse: 91 92  93 95  Resp: 18 19 19 17   Temp: (!) 97.5 F (36.4 C) 97.7 F (36.5 C)  97.6 F (36.4 C)  TempSrc: Oral Oral  Oral  SpO2: 100% 100% 97% 100%  Weight:      Height:        General: Alert awake, not in obvious distress, thinly built HENT: pupils equally reacting to light,  No scleral pallor or icterus noted. Oral mucosa is moist.  Chest:  Clear breath sounds.  Diminished  breath sounds bilaterally. No crackles or wheezes.  Left sided AICD in place. CVS: S1 &S2 heard. No murmur.  Regular rate and rhythm. Abdomen: Soft, nontender, nondistended.  Bowel sounds are heard.   Extremities: No cyanosis, clubbing or edema.  Peripheral pulses are palpable. Psych: Alert, awake and oriented, normal mood CNS:  No cranial nerve deficits.  Power equal in all extremities.   Skin: Warm and dry.  No rashes noted.  The results of significant diagnostics from this hospitalization (including imaging, microbiology, ancillary and laboratory) are listed below for reference.     Diagnostic Studies:   DG Chest Portable 1 View  Result Date: 12/15/2019 CLINICAL DATA:  Hypotension.  Dyspnea. EXAM: PORTABLE CHEST 1 VIEW COMPARISON:  Chest x-ray dated 10/28/2019 FINDINGS: AICD in place. Heart size and pulmonary vascularity are normal. No infiltrates or effusions. Clearing of the haziness at the left lung base since the prior study. No acute bone abnormality. IMPRESSION: No acute abnormalities. Electronically Signed   By: Lorriane Shire M.D.   On: 12/15/2019 16:30     Labs:   Basic Metabolic Panel: Recent Labs  Lab 12/15/19 1515 12/16/19 0551  NA 133* 136  K 4.3 4.9  CL 103 106  CO2 21* 24  GLUCOSE 128* 93  BUN 25* 27*  CREATININE 2.04* 1.88*  CALCIUM 9.1 9.2   GFR Estimated Creatinine Clearance: 31.6 mL/min (A) (by C-G formula based on SCr of 1.88 mg/dL (H)). Liver Function Tests: Recent Labs  Lab 12/16/19 0551  AST 126*  ALT 366*  ALKPHOS 101  BILITOT 0.7  PROT 6.3*  ALBUMIN 3.1*   No results for input(s): LIPASE, AMYLASE in the last 168 hours. No results for input(s): AMMONIA in the last 168 hours. Coagulation profile No results for input(s): INR, PROTIME in the last 168 hours.  CBC: Recent Labs  Lab 12/15/19 1515 12/16/19 0551  WBC 9.0 8.1  HGB 10.4* 9.6*  HCT 32.2* 29.8*  MCV 91.5 92.3  PLT 257 229   Cardiac Enzymes: No results for input(s):  CKTOTAL, CKMB, CKMBINDEX, TROPONINI in the last 168 hours. BNP: Invalid input(s): POCBNP CBG: No results for input(s): GLUCAP in the last 168 hours. D-Dimer No results for input(s): DDIMER in the last 72 hours. Hgb A1c No results for input(s): HGBA1C in the last 72 hours. Lipid Profile No results for input(s): CHOL, HDL, LDLCALC, TRIG, CHOLHDL, LDLDIRECT in the last 72 hours. Thyroid function studies No results for input(s): TSH, T4TOTAL, T3FREE, THYROIDAB in the last 72 hours.  Invalid input(s): FREET3 Anemia work up No results for input(s): VITAMINB12, FOLATE, FERRITIN, TIBC, IRON, RETICCTPCT in the last 72 hours. Microbiology Recent Results (from the past 240 hour(s))  SARS Coronavirus 2 by RT PCR (hospital order, performed in [facility] hospital lab) Nasopharyngeal Nasopharyngeal Swab     Status: None   Collection Time: 12/15/19  5:54 PM   Specimen: Nasopharyngeal Swab  Result Value Ref Range Status   SARS Coronavirus 2 NEGATIVE NEGATIVE Final  Comment: (NOTE) SARS-CoV-2 target nucleic acids are NOT DETECTED.  The SARS-CoV-2 RNA is generally detectable in upper and lower respiratory specimens during the acute phase of infection. The lowest concentration of SARS-CoV-2 viral copies this assay can detect is 250 copies / mL. A negative result does not preclude SARS-CoV-2 infection and should not be used as the sole basis for treatment or other patient management decisions.  A negative result may occur with improper specimen collection / handling, submission of specimen other than nasopharyngeal swab, presence of viral mutation(s) within the areas targeted by this assay, and inadequate number of viral copies (<250 copies / mL). A negative result must be combined with clinical observations, patient history, and epidemiological information.  Fact Sheet for Patients:   StrictlyIdeas.no  Fact Sheet for Healthcare  Providers: BankingDealers.co.za  This test is not yet approved or  cleared by the [place] FDA and has been authorized for detection and/or diagnosis of SARS-CoV-2 by FDA under an Emergency Use Authorization (EUA).  This EUA will remain in effect (meaning this test can be used) for the duration of the COVID-19 declaration under Section 564(b)(1) of the Act, 21 U.S.C. section 360bbb-3(b)(1), unless the authorization is terminated or revoked sooner.  Performed at [facility], [address] [place], [place] [address]      Discharge Instructions:   Discharge Instructions    Diet - low sodium heart healthy   Complete by: As directed    Discharge instructions   Complete by: As directed    Follow-up with your primary care provider in 1 week.  Follow-up with your cardiologist as scheduled by you.  Take all medications as prescribed.  Check blood work with your primary care physician in the next visit.   Increase activity slowly   Complete by: As directed      Allergies as of 12/16/2019   No Known Allergies     Medication List    STOP taking these medications   amoxicillin-clavulanate 875-125 MG tablet Commonly known as: AUGMENTIN   doxycycline 100 MG tablet Commonly known as: VIBRA-TABS   ondansetron 4 MG/2ML Soln injection Commonly known as: ZOFRAN   predniSONE 10 MG tablet Commonly known as: DELTASONE     TAKE these medications   acetaminophen 325 MG tablet Commonly known as: TYLENOL Take 2 tablets (650 mg total) by mouth every 4 (four) hours as needed for headache or mild pain.   albuterol (2.5 MG/3ML) 0.083% nebulizer solution Commonly known as: PROVENTIL Inhale 3 mLs (2.5 mg total) into the lungs as needed for shortness of breath.   amiodarone 400 MG tablet Commonly known as: PACERONE Take 1 tablet (400 mg total) by mouth daily. What changed: when to take this   clopidogrel 75 MG tablet Commonly known as: PLAVIX Take 1  tablet (75 mg total) by mouth daily.   Fluticasone-Salmeterol 500-50 MCG/DOSE Aepb Commonly known as: ADVAIR Inhale 1 puff into the lungs 2 (two) times daily.   gabapentin 100 MG capsule Commonly known as: NEURONTIN Take 100 mg by mouth at bedtime.   guaiFENesin 600 MG 12 hr tablet Commonly known as: MUCINEX Take 600 mg by mouth every 12 (twelve) hours.   ipratropium-albuterol 0.5-2.5 (3) MG/3ML Soln Commonly known as: DUONEB Take 3 mLs by nebulization 3 (three) times daily.   magnesium chloride 64 MG Tbec SR tablet Commonly known as: SLOW-MAG Take 1 tablet (64 mg total) by mouth daily.   melatonin 5 MG Tabs Take 0.5 tablets (2.5 mg total) by mouth  at bedtime as needed (sleep).   metoprolol succinate 100 MG 24 hr tablet Commonly known as: TOPROL-XL Take 100 mg by mouth daily.   mexiletine 150 MG capsule Commonly known as: MEXITIL Take 150 mg by mouth every 8 (eight) hours.   multivitamin with minerals Tabs tablet Take 1 tablet by mouth daily.   pantoprazole 40 MG tablet Commonly known as: PROTONIX Take 1 tablet (40 mg total) by mouth daily.   spironolactone 25 MG tablet Commonly known as: ALDACTONE Take 12.5 mg by mouth daily at 6 (six) AM.   tamsulosin 0.4 MG Caps capsule Commonly known as: FLOMAX Take 0.4 mg by mouth daily.         Time coordinating discharge: 39 minutes  Signed:  Lakevia Perris  [facility] 12/16/2019, 1:14 PM

## 2019-12-16 NOTE — Evaluation (Signed)
Physical Therapy Evaluation Patient Details Name: Matthew Zamora MRN: [id number] DOB: 1940/12/26 Today's Date: 12/16/2019   History of Present Illness  Pt is a 79 y.o. male presenting to hospital 6/30 with hypotension and generalized weakness.  SBP noted to be in the 70's.  Pt admitted with generalized weakness likely secondary volume depletion from overdiuresis.  PMH includes CAD s/p PCI x3 to the RCA, AAA, htn, CHF, s/p AICD placement, CKD, chronic a-fib, anemia.  Clinical Impression  Prior to hospital admission, pt reports being ambulatory with RW (will transfer by himself at home but only walks when he has someone with him for safety); lives alone but has support from family (daughter in law is registered nurse and comes 3x's per day to assist pt).  Currently pt is modified independent semi-supine to sitting edge of bed; min assist progressing to CGA with transfers; and CGA with ambulating 200 feet with RW.  Orthostatic vitals taken beginning of session with BP supine 135/73, BP sitting 120/83, BP standing 108/73, and BP standing at 3 minutes 100/70--nurse notified of pt's orthostatic vitals (pt asymptomatic throughout PT session); post ambulation BP 110/87.  Overall tolerated session well.  Pt would benefit from skilled PT to address noted impairments and functional limitations (see below for any additional details).  Upon hospital discharge, pt would benefit from continued HHPT.    Follow Up Recommendations Home health PT    Equipment Recommendations  Rolling walker with 5" wheels (pt has RW at home already)    Recommendations for Other Services       Precautions / Restrictions Precautions Precautions: Fall Precaution Comments: Orthostatic hypotension Restrictions Weight Bearing Restrictions: No      Mobility  Bed Mobility Overal bed mobility: Modified Independent             General bed mobility comments: Semi-supine to sitting edge of bed without any noted  difficulties  Transfers Overall transfer level: Needs assistance Equipment used: Rolling walker (2 wheeled) Transfers: Sit to/from Stand Sit to Stand: Min assist;Min guard         General transfer comment: min assist x1 to stand from bed 1st trial but CGA 2nd trial; CGA x1 to stand from recliner  Ambulation/Gait Ambulation/Gait assistance: Min guard;+2 safety/equipment (chair follow for safety) Gait Distance (Feet): 200 Feet Assistive device: Rolling walker (2 wheeled) Gait Pattern/deviations: Step-through pattern Gait velocity: mildly decreased   General Gait Details: steady with RW  Stairs            Wheelchair Mobility    Modified Rankin (Stroke Patients Only)       Balance Overall balance assessment: Needs assistance Sitting-balance support: No upper extremity supported;Feet supported Sitting balance-Leahy Scale: Normal Sitting balance - Comments: steady sitting reaching outside BOS   Standing balance support: No upper extremity supported Standing balance-Leahy Scale: Good Standing balance comment: steady standing reaching within BOS                             Pertinent Vitals/Pain Pain Assessment: No/denies pain  See flow sheet for vitals.    Home Living Family/patient expects to be discharged to:: Private residence Living Arrangements: Alone (Son lives next door) Available Help at Discharge: Family;Available PRN/intermittently Type of Home: Mobile home Home Access: Stairs to enter Entrance Stairs-Rails: Left Entrance Stairs-Number of Steps: 3 steps to platform and then 3 more steps into home Home Layout: One level Home Equipment: Grab bars - tub/shower;Shower seat;Toilet riser;Other (comment);Walker -  2 wheels (plans to borrow friends 4ww; has lift chair (sleeps in lift chair))      Prior Function Level of Independence: Needs assistance   Gait / Transfers Assistance Needed: Pt reports he will transfer to [facility] on his own but only  ambulates when someone is around to assist for safety (ambulates with RW)  ADL's / Homemaking Assistance Needed: Pt reports daughter in law is a Equities trader and comes 3x's per day (assists with meals, medications, cleaning)  Comments: Receives HHPT 3x's per week     Hand Dominance        Extremity/Trunk Assessment   Upper Extremity Assessment Upper Extremity Assessment: Generalized weakness    Lower Extremity Assessment Lower Extremity Assessment: Generalized weakness       Communication   Communication: No difficulties  Cognition Arousal/Alertness: Awake/alert Behavior During Therapy: WFL for tasks assessed/performed Overall Cognitive Status: Within Functional Limits for tasks assessed                                        General Comments   Nursing cleared pt for participation in physical therapy.  Pt agreeable to PT session.    Exercises     Assessment/Plan    PT Assessment Patient needs continued PT services  PT Problem List Decreased strength;Decreased balance;Decreased mobility       PT Treatment Interventions DME instruction;Gait training;Stair training;Functional mobility training;Therapeutic activities;Therapeutic exercise;Balance training;Patient/family education    PT Goals (Current goals can be found in the Care Plan section)  Acute Rehab PT Goals Patient Stated Goal: to go home PT Goal Formulation: With patient Time For Goal Achievement: 12/30/19 Potential to Achieve Goals: Good    Frequency Min 2X/week   Barriers to discharge        Co-evaluation               AM-PAC PT "6 Clicks" Mobility  Outcome Measure Help needed turning from your back to your side while in a flat bed without using bedrails?: None Help needed moving from lying on your back to sitting on the side of a flat bed without using bedrails?: None Help needed moving to and from a bed to a chair (including a wheelchair)?: A Little Help needed  standing up from a chair using your arms (e.g., wheelchair or bedside chair)?: A Little Help needed to walk in hospital room?: A Little Help needed climbing 3-5 steps with a railing? : A Little 6 Click Score: 20    End of Session Equipment Utilized During Treatment: Gait belt Activity Tolerance: Patient tolerated treatment well Patient left: in chair;with call bell/phone within reach;with chair alarm set Nurse Communication: Mobility status;Precautions;Other (comment) (pt's orthostatic vitals) PT Visit Diagnosis: Other abnormalities of gait and mobility (R26.89);Muscle weakness (generalized) (M62.81)    Time: 9528-4132 PT Time Calculation (min) (ACUTE ONLY): 45 min   Charges:   PT Evaluation $PT Eval Low Complexity: 1 Low PT Treatments $Therapeutic Exercise: 8-22 mins $Therapeutic Activity: 8-22 mins       Leitha Bleak, PT 12/16/19, 1:38 PM

## 2019-12-16 NOTE — Progress Notes (Signed)
Pt arrived to floor A&Ox4, no distress.  Able to communicate wants and needs.  Will continue to monitor.

## 2020-01-03 ENCOUNTER — Ambulatory Visit (INDEPENDENT_AMBULATORY_CARE_PROVIDER_SITE_OTHER): Payer: Medicare Other | Admitting: Vascular Surgery

## 2020-01-03 ENCOUNTER — Other Ambulatory Visit (INDEPENDENT_AMBULATORY_CARE_PROVIDER_SITE_OTHER): Payer: Medicare Other

## 2020-01-17 ENCOUNTER — Emergency Department: Payer: Medicare Other

## 2020-01-17 ENCOUNTER — Other Ambulatory Visit: Payer: Self-pay

## 2020-01-17 ENCOUNTER — Emergency Department
Admission: EM | Admit: 2020-01-17 | Discharge: 2020-01-17 | Disposition: A | Discharge status: Home / Self Care | Payer: Medicare Other | Attending: Emergency Medicine | Admitting: Emergency Medicine

## 2020-01-17 ENCOUNTER — Encounter: Payer: Self-pay | Admitting: Emergency Medicine

## 2020-01-17 DIAGNOSIS — W010XXA Fall on same level from slipping, tripping and stumbling without subsequent striking against object, initial encounter: Secondary | ICD-10-CM | POA: Insufficient documentation

## 2020-01-17 DIAGNOSIS — Z79899 Other long term (current) drug therapy: Secondary | ICD-10-CM | POA: Diagnosis not present

## 2020-01-17 DIAGNOSIS — Y999 Unspecified external cause status: Secondary | ICD-10-CM | POA: Insufficient documentation

## 2020-01-17 DIAGNOSIS — S299XXA Unspecified injury of thorax, initial encounter: Secondary | ICD-10-CM | POA: Diagnosis present

## 2020-01-17 DIAGNOSIS — I509 Heart failure, unspecified: Secondary | ICD-10-CM | POA: Diagnosis not present

## 2020-01-17 DIAGNOSIS — Y929 Unspecified place or not applicable: Secondary | ICD-10-CM | POA: Diagnosis not present

## 2020-01-17 DIAGNOSIS — I130 Hypertensive heart and chronic kidney disease with heart failure and stage 1 through stage 4 chronic kidney disease, or unspecified chronic kidney disease: Secondary | ICD-10-CM | POA: Diagnosis not present

## 2020-01-17 DIAGNOSIS — S2242XA Multiple fractures of ribs, left side, initial encounter for closed fracture: Secondary | ICD-10-CM | POA: Diagnosis not present

## 2020-01-17 DIAGNOSIS — I2510 Atherosclerotic heart disease of native coronary artery without angina pectoris: Secondary | ICD-10-CM | POA: Diagnosis not present

## 2020-01-17 DIAGNOSIS — Z87891 Personal history of nicotine dependence: Secondary | ICD-10-CM | POA: Diagnosis not present

## 2020-01-17 DIAGNOSIS — N183 Chronic kidney disease, stage 3 unspecified: Secondary | ICD-10-CM | POA: Diagnosis not present

## 2020-01-17 DIAGNOSIS — Y939 Activity, unspecified: Secondary | ICD-10-CM | POA: Diagnosis not present

## 2020-01-17 LAB — TROPONIN I (HIGH SENSITIVITY): Troponin I (High Sensitivity): 10 ng/L (ref <18)

## 2020-01-17 LAB — BASIC METABOLIC PANEL
Anion gap: 8 (ref 5–15)
BUN: 21 mg/dL (ref 8–23)
CO2: 24 mmol/L (ref 22–32)
Calcium: 9.1 mg/dL (ref 8.9–10.3)
Chloride: 103 mmol/L (ref 98–111)
Creatinine, Ser: 2.05 mg/dL — ABNORMAL HIGH (ref 0.61–1.24)
GFR calc Af Amer: 35 mL/min — ABNORMAL LOW (ref >60)
GFR calc non Af Amer: 30 mL/min — ABNORMAL LOW (ref >60)
Glucose, Bld: 104 mg/dL — ABNORMAL HIGH (ref 70–99)
Potassium: 4.8 mmol/L (ref 3.5–5.1)
Sodium: 135 mmol/L (ref 135–145)

## 2020-01-17 LAB — CBC
HCT: 37.2 % — ABNORMAL LOW (ref 39.0–52.0)
Hemoglobin: 12.3 g/dL — ABNORMAL LOW (ref 13.0–17.0)
MCH: 30.1 pg (ref 26.0–34.0)
MCHC: 33.1 g/dL (ref 30.0–36.0)
MCV: 91 fL (ref 80.0–100.0)
Platelets: 197 10*3/uL (ref 150–400)
RBC: 4.09 MIL/uL — ABNORMAL LOW (ref 4.22–5.81)
RDW: 15.1 % (ref 11.5–15.5)
WBC: 7.6 10*3/uL (ref 4.0–10.5)
nRBC: 0 % (ref 0.0–0.2)

## 2020-01-17 MED ORDER — MORPHINE SULFATE (PF) 2 MG/ML IV SOLN
2.0000 mg | Freq: Once | INTRAVENOUS | Status: AC
  Administered 2020-01-17: 2 mg via INTRAVENOUS
  Filled 2020-01-17: qty 1

## 2020-01-17 MED ORDER — HYDROCODONE-ACETAMINOPHEN 5-325 MG PO TABS: 1.0000 | ORAL_TABLET | Freq: Four times a day (QID) | ORAL | 0 refills | Status: DC | PRN

## 2020-01-17 MED ORDER — ONDANSETRON HCL 4 MG/2ML IJ SOLN
4.0000 mg | Freq: Once | INTRAMUSCULAR | Status: AC
  Administered 2020-01-17: 4 mg via INTRAVENOUS
  Filled 2020-01-17: qty 2

## 2020-01-17 MED ORDER — HYDROCODONE-ACETAMINOPHEN 5-325 MG PO TABS
1.0000 | ORAL_TABLET | Freq: Once | ORAL | Status: AC
  Administered 2020-01-17: 1 via ORAL
  Filled 2020-01-17: qty 1

## 2020-01-17 NOTE — ED Provider Notes (Signed)
[facility] Emergency Department Provider Note   ____________________________________________    I have reviewed the triage vital signs and the nursing notes.   HISTORY  Chief Complaint Chest Pain     HPI Matthew Zamora is a 79 y.o. male who presents with complaints of left-sided chest wall pain.  Patient reports he felt 3 days ago and injured his left chest wall.  He reports it has been hurting since then.  He denies other injuries from the fall.  He is not on blood thinners.  Denies shortness of breath although does report some pain in his left chest when taking a deep breath.  No diaphoresis nausea or vomiting.  Has not take anything for this.  No head injury.  No abdominal pain nausea or vomiting  Past Medical History:  Diagnosis Date  . Abdominal aortic aneurysm (AAA) ([place]) 05/13/15   seen on ct scan  . 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 Anemia   . 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 CAD (coronary artery disease)   . Cataract cortical, senile   . CHF (congestive heart failure) ([place])   . Chronic hoarseness   . Exocrine pancreatic insufficiency   . H. pylori infection   . History of hepatitis   . Hyperlipidemia   . Hypertension   . 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 PAF (paroxysmal atrial fibrillation) ([place])   . Prostate CA [facility])     Patient Active Problem List   Diagnosis Date Noted  . Volume depletion 12/15/2019  . Hypotension 12/15/2019  . Cardiorenal syndrome   . Atrial fibrillation with RVR ([place])   . Defibrillator discharge   . Tachycardia 10/25/2019  . CHF (congestive heart failure) ([place]) 09/18/2019  . Acute on chronic combined systolic and diastolic CHF (congestive heart failure) ([place]) 09/17/2019  . CAD (coronary artery disease) 09/17/2019  . Ischemic cardiomyopathy 09/17/2019  . CKD (chronic kidney disease) stage 3, GFR 30-59 ml/min 09/17/2019  . Acute hypoxemic respiratory failure ([place]) 09/17/2018  .  AAA (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 ([place]) 07/06/2018  . Essential hypertension 07/06/2018  . Hyperlipidemia 07/06/2018  . Paroxysmal atrial fibrillation ([name])   . Acute on chronic respiratory failure with hypoxia ([place]) 04/26/2018    Past Surgical History:  Procedure Laterality Date  . CATARACT EXTRACTION    . COLONOSCOPY  10/06/2014, 09/18/2004, 03/14/2009  . ESOPHAGOGASTRODUODENOSCOPY  10/06/2014, 03/18/2001, 03/14/2009  . ESOPHAGOGASTRODUODENOSCOPY (EGD) WITH PROPOFOL N/A 05/07/2018   Procedure: ESOPHAGOGASTRODUODENOSCOPY (EGD) WITH PROPOFOL;  Surgeon: Toledo, [name], MD;  Location: [facility] ENDOSCOPY;  Service: Gastroenterology;  Laterality: N/A;  . FLEXIBLE SIGMOIDOSCOPY  08/26/1990  . INSERTION OF ICD    . PROSTATE SURGERY    . TONSILLECTOMY      Prior to Admission medications   Medication Sig Start Date End Date Taking? Authorizing Provider  acetaminophen (TYLENOL) 325 MG tablet Take 2 tablets (650 mg total) by mouth every 4 (four) hours as needed for headache or mild pain. 10/29/19   Bradly Bienenstock, NP  albuterol (PROVENTIL) (2.5 MG/3ML) 0.083% nebulizer solution Inhale 3 mLs (2.5 mg total) into the lungs as needed for shortness of breath. 10/29/19   Bradly Bienenstock, NP  amiodarone (PACERONE) 400 MG tablet Take 1 tablet (400 mg total) by mouth daily. 12/16/19   Pokhrel, Corrie Mckusick, MD  clopidogrel (PLAVIX) 75 MG tablet Take 1 tablet (75 mg total) by mouth daily. 10/29/19   Bradly Bienenstock, NP  Fluticasone-Salmeterol (ADVAIR) 500-50 MCG/DOSE AEPB Inhale 1  puff into the lungs 2 (two) times daily. 12/09/19   [provider]  gabapentin (NEURONTIN) 100 MG capsule Take 100 mg by mouth at bedtime. 12/09/19   [provider]  HYDROcodone-acetaminophen (NORCO/VICODIN) 5-325 MG tablet Take 1 tablet by mouth every 6 (six) hours as needed for severe pain. 01/17/20   Lavonia Drafts, MD  ipratropium-albuterol (DUONEB) 0.5-2.5 (3) MG/3ML SOLN Take 3 mLs by nebulization 3  (three) times daily. 10/29/19   Bradly Bienenstock, NP  magnesium chloride (SLOW-MAG) 64 MG TBEC SR tablet Take 1 tablet (64 mg total) by mouth daily. 10/29/19   Bradly Bienenstock, NP  melatonin 5 MG TABS Take 0.5 tablets (2.5 mg total) by mouth at bedtime as needed (sleep). 10/29/19   Bradly Bienenstock, NP  metoprolol succinate (TOPROL-XL) 100 MG 24 hr tablet Take 100 mg by mouth daily. 12/03/19   [provider]  mexiletine (MEXITIL) 150 MG capsule Take 150 mg by mouth every 8 (eight) hours. 12/01/19   [provider]  Multiple Vitamin (MULTIVITAMIN WITH MINERALS) TABS tablet Take 1 tablet by mouth daily. 10/29/19   Bradly Bienenstock, NP  pantoprazole (PROTONIX) 40 MG tablet Take 1 tablet (40 mg total) by mouth daily. 10/29/19   Darel Hong D, NP  spironolactone (ALDACTONE) 25 MG tablet Take 12.5 mg by mouth daily at 6 (six) AM. 12/09/19   [provider]  tamsulosin (FLOMAX) 0.4 MG CAPS capsule Take 0.4 mg by mouth daily. 12/09/19   [provider]     Allergies Patient has no known allergies.  Family History  Problem Relation Age of Onset  . Heart attack Mother   . Heart attack Father     Social History Social History   Tobacco Use  . Smoking status: Former Smoker    Packs/day: 1.00    Years: 38.00    Pack years: 38.00    Types: Cigarettes    Quit date: 06/18/1999    Years since quitting: 20.5  . Smokeless tobacco: Never Used  Vaping Use  . Vaping Use: Never used  Substance Use Topics  . Alcohol use: No    Alcohol/week: 0.0 standard drinks  . Drug use: Not Currently    Review of Systems  Constitutional: No fever/chills Eyes: No visual changes.  ENT: No sore throat. Cardiovascular: Left-sided chest pain as above Respiratory: Denies shortness of breath. Gastrointestinal: No abdominal pain.   Genitourinary: Negative for dysuria. Musculoskeletal: Negative for back pain. Skin: Negative for rash. Neurological: Negative for headaches     ____________________________________________   PHYSICAL EXAM:  VITAL SIGNS: ED Triage Vitals  Enc Vitals Group     BP 01/17/20 1621 (!) 100/56     Pulse Rate 01/17/20 1621 (!) 59     Resp 01/17/20 1621 16     Temp 01/17/20 1621 97.6 F (36.4 C)     Temp Source 01/17/20 1621 Oral     SpO2 01/17/20 1621 98 %     Weight 01/17/20 1621 64 kg (141 lb)     Height 01/17/20 1621 1.727 m (5\' 8" )     Head Circumference --      Peak Flow --      Pain Score 01/17/20 1632 10     Pain Loc --      Pain Edu? --      Excl. in [place]? --     Constitutional: Alert and oriented.   Nose: No congestion/rhinnorhea. Mouth/Throat: Mucous membranes are moist.    Cardiovascular: Normal  rate, regular rhythm.  Good peripheral circulation.  Mild tenderness palpation left lower anterior rib cage, no palpable bony abnormalities, no bruising or swelling Respiratory: Normal respiratory effort.  No retractions. Lungs CTAB. Gastrointestinal: Soft and nontender. No distention.  No CVA tenderness.  Reassuring exam  Musculoskeletal: No lower extremity tenderness nor edema.  Warm and well perfused Neurologic:  Normal speech and language. No gross focal neurologic deficits are appreciated.  Skin:  Skin is warm, dry and intact. No rash noted. Psychiatric: Mood and affect are normal. Speech and behavior are normal.  ____________________________________________   LABS (all labs ordered are listed, but only abnormal results are displayed)  Labs Reviewed  CBC - Abnormal; Notable for the following components:      Result Value   RBC 4.09 (*)    Hemoglobin 12.3 (*)    HCT 37.2 (*)    All other components within normal limits  BASIC METABOLIC PANEL - Abnormal; Notable for the following components:   Glucose, Bld 104 (*)    Creatinine, Ser 2.05 (*)    GFR calc non Af Amer 30 (*)    GFR calc Af Amer 35 (*)    All other components within normal limits  URINALYSIS, COMPLETE (UACMP) WITH MICROSCOPIC  CBG  MONITORING, ED  TROPONIN I (HIGH SENSITIVITY)   ____________________________________________  EKG  ED ECG REPORT I, Lavonia Drafts, the attending physician, personally viewed and interpreted this ECG.  Date: 01/17/2020  Rhythm: Atrial paced QRS Axis: normal Intervals: Abnormal ST/T Wave abnormalities: Nonspecific change Narrative Interpretation: no evidence of acute ischemia  ____________________________________________  RADIOLOGY  Rib x-ray reviewed by me, no fracture noted, pending radiology review ____________________________________________   PROCEDURES  Procedure(s) performed: No  Procedures   Critical Care performed: No ____________________________________________   INITIAL IMPRESSION / ASSESSMENT AND PLAN / ED COURSE  Pertinent labs & imaging results that were available during my care of the patient were reviewed by me and considered in my medical decision making (see chart for details).  Patient presents after mechanical fall, he reports that he got tripped over his feet.  Complains of pain in his left lower anterior rib cage, tenderness palpation there suspicious for rib fracture versus rib contusion.  Lab work today is not significantly changed from prior, he does have a history of chronic kidney disease.  X-ray is consistent with nondisplaced fractures of the left 11th and 10th ribs per radiology  No pneumothorax.  Patient's pain improved after IV morphine and IV Zofran.  Appropriate for discharge with analgesics, incentive spirometer provided.  Return precautions discussed, outpatient follow-up recommended    ____________________________________________   FINAL CLINICAL IMPRESSION(S) / ED DIAGNOSES  Final diagnoses:  Closed fracture of multiple ribs of left side, initial encounter        Note:  This document was prepared using Dragon voice recognition software and may include unintentional dictation errors.   Lavonia Drafts, MD 01/17/20  2015

## 2020-01-17 NOTE — ED Triage Notes (Signed)
Pt in via EMS from home with c/o fall 2 days ago. Pt startd having some left sided rib pain today. Hx of CHF and takes blood thinners, 128/78

## 2020-01-17 NOTE — ED Notes (Signed)
Pt states that he has pain in left lower side. Pt states he had a fall the other day and that his pain is a 10 when he moves.

## 2020-01-17 NOTE — ED Notes (Signed)
Pt transported to xray 

## 2020-01-17 NOTE — ED Triage Notes (Signed)
Pt in via [facility] from home, reports mechanical fall Saturday night, since having some left rib pain.  During triage reports feeling as if he may pass out.  Pt appears pale.

## 2020-01-18 ENCOUNTER — Telehealth: Payer: Self-pay | Admitting: Family

## 2020-01-18 ENCOUNTER — Ambulatory Visit: Payer: Medicare Other | Admitting: Family

## 2020-01-18 NOTE — Telephone Encounter (Signed)
Patient did not show for his [facility] appointment on 01/18/20. Will attempt to reschedule.

## 2020-02-14 ENCOUNTER — Emergency Department: Payer: Medicare Other

## 2020-02-14 ENCOUNTER — Encounter: Payer: Self-pay | Admitting: Emergency Medicine

## 2020-02-14 DIAGNOSIS — Y939 Activity, unspecified: Secondary | ICD-10-CM | POA: Insufficient documentation

## 2020-02-14 DIAGNOSIS — Y929 Unspecified place or not applicable: Secondary | ICD-10-CM | POA: Diagnosis not present

## 2020-02-14 DIAGNOSIS — W19XXXA Unspecified fall, initial encounter: Secondary | ICD-10-CM | POA: Diagnosis not present

## 2020-02-14 DIAGNOSIS — R251 Tremor, unspecified: Secondary | ICD-10-CM | POA: Diagnosis not present

## 2020-02-14 DIAGNOSIS — Y999 Unspecified external cause status: Secondary | ICD-10-CM | POA: Insufficient documentation

## 2020-02-14 DIAGNOSIS — Z5321 Procedure and treatment not carried out due to patient leaving prior to being seen by health care provider: Secondary | ICD-10-CM | POA: Diagnosis not present

## 2020-02-14 DIAGNOSIS — S2239XA Fracture of one rib, unspecified side, initial encounter for closed fracture: Secondary | ICD-10-CM | POA: Insufficient documentation

## 2020-02-14 DIAGNOSIS — S299XXA Unspecified injury of thorax, initial encounter: Secondary | ICD-10-CM | POA: Diagnosis present

## 2020-02-14 LAB — CBC
HCT: 35.9 % — ABNORMAL LOW (ref 39.0–52.0)
Hemoglobin: 11.4 g/dL — ABNORMAL LOW (ref 13.0–17.0)
MCH: 30 pg (ref 26.0–34.0)
MCHC: 31.8 g/dL (ref 30.0–36.0)
MCV: 94.5 fL (ref 80.0–100.0)
Platelets: 165 10*3/uL (ref 150–400)
RBC: 3.8 MIL/uL — ABNORMAL LOW (ref 4.22–5.81)
RDW: 14.9 % (ref 11.5–15.5)
WBC: 7.2 10*3/uL (ref 4.0–10.5)
nRBC: 0 % (ref 0.0–0.2)

## 2020-02-14 LAB — BASIC METABOLIC PANEL
Anion gap: 10 (ref 5–15)
BUN: 22 mg/dL (ref 8–23)
CO2: 24 mmol/L (ref 22–32)
Calcium: 9.3 mg/dL (ref 8.9–10.3)
Chloride: 103 mmol/L (ref 98–111)
Creatinine, Ser: 2.16 mg/dL — ABNORMAL HIGH (ref 0.61–1.24)
GFR calc Af Amer: 33 mL/min — ABNORMAL LOW (ref >60)
GFR calc non Af Amer: 28 mL/min — ABNORMAL LOW (ref >60)
Glucose, Bld: 110 mg/dL — ABNORMAL HIGH (ref 70–99)
Potassium: 4.4 mmol/L (ref 3.5–5.1)
Sodium: 137 mmol/L (ref 135–145)

## 2020-02-14 LAB — TROPONIN I (HIGH SENSITIVITY): Troponin I (High Sensitivity): 14 ng/L (ref <18)

## 2020-02-14 MED ORDER — ALBUTEROL SULFATE (2.5 MG/3ML) 0.083% IN NEBU: 5.0000 mg | INHALATION_SOLUTION | Freq: Once | RESPIRATORY_TRACT | Status: DC

## 2020-02-14 NOTE — ED Triage Notes (Signed)
Pt via ems from home after suffering a fall on Saturdayu. Pt reports that he felt like he passed out and injured his right rib and his left knee. He reports that he has been "a little bit" short of breath upon exertion today. He also re[prts that he purchased 2 canisters of oxygen around the first of the year "in case I needed them." and he has used it twice since then. He states that he has no need of oxygen on a regular basis. Pt alert & oriented, nad noted.

## 2020-02-14 NOTE — ED Triage Notes (Signed)
[facility] brought from hom for fall Saturday.  Had fx rib.  Today complains of shakiness.  Not complaining of sob, but asking it for it.  Seems anxious.  99% 2 liters.  vss

## 2020-02-15 ENCOUNTER — Emergency Department
Admission: EM | Admit: 2020-02-15 | Discharge: 2020-02-15 | Disposition: A | Discharge status: Home / Self Care | Payer: Medicare Other | Attending: Emergency Medicine | Admitting: Emergency Medicine

## 2020-02-15 NOTE — ED Notes (Signed)
No answer when called several times from lobby 

## 2020-02-15 NOTE — ED Notes (Addendum)
No answer when called several times from lobby 

## 2020-05-10 ENCOUNTER — Encounter: Payer: Self-pay | Admitting: Emergency Medicine

## 2020-05-10 ENCOUNTER — Emergency Department: Payer: Medicare Other

## 2020-05-10 ENCOUNTER — Other Ambulatory Visit: Payer: Self-pay

## 2020-05-10 ENCOUNTER — Emergency Department
Admission: EM | Admit: 2020-05-10 | Discharge: 2020-05-11 | Disposition: A | Discharge status: Home / Self Care | Payer: Medicare Other | Attending: Student in an Organized Health Care Education/Training Program | Admitting: Student in an Organized Health Care Education/Training Program

## 2020-05-10 DIAGNOSIS — I2510 Atherosclerotic heart disease of native coronary artery without angina pectoris: Secondary | ICD-10-CM | POA: Diagnosis not present

## 2020-05-10 DIAGNOSIS — Z7901 Long term (current) use of anticoagulants: Secondary | ICD-10-CM | POA: Insufficient documentation

## 2020-05-10 DIAGNOSIS — I5043 Acute on chronic combined systolic (congestive) and diastolic (congestive) heart failure: Secondary | ICD-10-CM | POA: Insufficient documentation

## 2020-05-10 DIAGNOSIS — K56699 Other intestinal obstruction unspecified as to partial versus complete obstruction: Secondary | ICD-10-CM | POA: Diagnosis not present

## 2020-05-10 DIAGNOSIS — N183 Chronic kidney disease, stage 3 unspecified: Secondary | ICD-10-CM | POA: Insufficient documentation

## 2020-05-10 DIAGNOSIS — Z79899 Other long term (current) drug therapy: Secondary | ICD-10-CM | POA: Diagnosis not present

## 2020-05-10 DIAGNOSIS — I951 Orthostatic hypotension: Secondary | ICD-10-CM | POA: Insufficient documentation

## 2020-05-10 DIAGNOSIS — E860 Dehydration: Secondary | ICD-10-CM

## 2020-05-10 DIAGNOSIS — I714 Abdominal aortic aneurysm, without rupture, unspecified: Secondary | ICD-10-CM

## 2020-05-10 DIAGNOSIS — I480 Paroxysmal atrial fibrillation: Secondary | ICD-10-CM | POA: Insufficient documentation

## 2020-05-10 DIAGNOSIS — Z87891 Personal history of nicotine dependence: Secondary | ICD-10-CM | POA: Insufficient documentation

## 2020-05-10 DIAGNOSIS — I130 Hypertensive heart and chronic kidney disease with heart failure and stage 1 through stage 4 chronic kidney disease, or unspecified chronic kidney disease: Secondary | ICD-10-CM | POA: Insufficient documentation

## 2020-05-10 DIAGNOSIS — R42 Dizziness and giddiness: Secondary | ICD-10-CM | POA: Diagnosis present

## 2020-05-10 LAB — BASIC METABOLIC PANEL
Anion gap: 10 (ref 5–15)
BUN: 17 mg/dL (ref 8–23)
CO2: 23 mmol/L (ref 22–32)
Calcium: 9.4 mg/dL (ref 8.9–10.3)
Chloride: 99 mmol/L (ref 98–111)
Creatinine, Ser: 1.71 mg/dL — ABNORMAL HIGH (ref 0.61–1.24)
GFR, Estimated: 40 mL/min — ABNORMAL LOW (ref >60)
Glucose, Bld: 110 mg/dL — ABNORMAL HIGH (ref 70–99)
Potassium: 4.4 mmol/L (ref 3.5–5.1)
Sodium: 132 mmol/L — ABNORMAL LOW (ref 135–145)

## 2020-05-10 LAB — CBC
HCT: 37 % — ABNORMAL LOW (ref 39.0–52.0)
Hemoglobin: 11.5 g/dL — ABNORMAL LOW (ref 13.0–17.0)
MCH: 29.9 pg (ref 26.0–34.0)
MCHC: 31.1 g/dL (ref 30.0–36.0)
MCV: 96.4 fL (ref 80.0–100.0)
Platelets: 228 10*3/uL (ref 150–400)
RBC: 3.84 MIL/uL — ABNORMAL LOW (ref 4.22–5.81)
RDW: 12.8 % (ref 11.5–15.5)
WBC: 7 10*3/uL (ref 4.0–10.5)
nRBC: 0 % (ref 0.0–0.2)

## 2020-05-10 LAB — CBG MONITORING, ED: Glucose-Capillary: 94 mg/dL (ref 70–99)

## 2020-05-10 LAB — TROPONIN I (HIGH SENSITIVITY): Troponin I (High Sensitivity): 15 ng/L (ref <18)

## 2020-05-10 MED ORDER — SODIUM CHLORIDE 0.9 % IV BOLUS
500.0000 mL | Freq: Once | INTRAVENOUS | Status: AC
  Administered 2020-05-10: 500 mL via INTRAVENOUS

## 2020-05-10 MED ORDER — IOHEXOL 300 MG/ML  SOLN
75.0000 mL | Freq: Once | INTRAMUSCULAR | Status: AC | PRN
  Administered 2020-05-10: 75 mL via INTRAVENOUS

## 2020-05-10 NOTE — ED Notes (Signed)
Orthostatic VS completed and charted. Pt noted to have extreme difficulty standing even with assistance of RN and support of cane.

## 2020-05-10 NOTE — ED Provider Notes (Signed)
-----------------------------------------   11:14 PM on 05/10/2020 -----------------------------------------  Assuming care from Dr. Quentin Cornwall.  In short, Matthew Zamora is a 79 y.o. male with a chief complaint of near syncope and weakness (orthostatic).  Refer to the original H&P for additional details.  The current plan of care is to reassess after fluids and CT scan (patient has had some abdominal pain and constipation as well).  Anticipate discharge if patient feels better after fluids and if there are not emergent findings on CT abd/pelvis.    ----------------------------------------- 2:17 AM on 05/11/2020 -----------------------------------------  No acute findings on CT scan.  Patient felt better after fluids, no clinical orthostasis.  He was able to ambulate with his walker all around the emergency department with no dizziness, near syncope, nor shortness of breath.  I talked to the patient about going home and following up as an outpatient and he agrees with this plan.  I gave my usual and customary return precautions.   Hinda Kehr, MD 05/11/20 [phone]

## 2020-05-10 NOTE — ED Notes (Signed)
Spoke to patient's daughter in law with patient's permission and provided update regarding treatment to this point and plan of care. Family verbalized understanding of all discussed.

## 2020-05-10 NOTE — ED Triage Notes (Signed)
Pt comes into the ED via [facility] from home c/o generalized weakness and the feeling that he will have near syncopal episodes upon standing.  Pt states he stood up earlier this morning and did fall.  Pt neurologically intact at this time and admits that since he had COVID a couple months ago, he has not returned to baseline and he has not been drinking enough water.  Pt has even and unlabored respirations at this time. Pt denies hitting his head on his fall today and denies any known LOC at this point.

## 2020-05-10 NOTE — ED Provider Notes (Signed)
[facility] Emergency Department Provider Note    First MD Initiated Contact with Patient 05/10/20 2215     (approximate)  I have reviewed the triage vital signs and the nursing notes.   HISTORY  Chief Complaint Weakness    HPI Matthew Zamora is a 79 y.o. male below listed past medical history presents to the ER for evaluation of generalized malaise lightheadedness feeling his best test out when he stands up due to decreased p.o. intake secondary to nausea vomiting for the past 9 days.  States he has not moved his bowels in several days.  Does not feel constipated.  States he never felt much better after being sick with Covid roughly 4 months ago.  He denies any chest pain right now.  No fevers.    Past Medical History:  Diagnosis Date  . Abdominal aortic aneurysm (AAA) ([place]) 05/13/15   seen on ct scan  . 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 Anemia   . 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 CAD (coronary artery disease)   . Cataract cortical, senile   . CHF (congestive heart failure) ([place])   . Chronic hoarseness   . Exocrine pancreatic insufficiency   . H. pylori infection   . History of hepatitis   . Hyperlipidemia   . Hypertension   . 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 PAF (paroxysmal atrial fibrillation) ([place])   . Prostate CA [facility])    Family History  Problem Relation Age of Onset  . Heart attack Mother   . Heart attack Father    Past Surgical History:  Procedure Laterality Date  . CATARACT EXTRACTION    . COLONOSCOPY  10/06/2014, 09/18/2004, 03/14/2009  . ESOPHAGOGASTRODUODENOSCOPY  10/06/2014, 03/18/2001, 03/14/2009  . ESOPHAGOGASTRODUODENOSCOPY (EGD) WITH PROPOFOL N/A 05/07/2018   Procedure: ESOPHAGOGASTRODUODENOSCOPY (EGD) WITH PROPOFOL;  Surgeon: Toledo, [name], MD;  Location: [facility] ENDOSCOPY;  Service: Gastroenterology;  Laterality: N/A;  . FLEXIBLE SIGMOIDOSCOPY  08/26/1990  . INSERTION OF ICD    . PROSTATE SURGERY     . TONSILLECTOMY     Patient Active Problem List   Diagnosis Date Noted  . Volume depletion 12/15/2019  . Hypotension 12/15/2019  . Cardiorenal syndrome   . Atrial fibrillation with RVR ([place])   . Defibrillator discharge   . Tachycardia 10/25/2019  . CHF (congestive heart failure) ([place]) 09/18/2019  . Acute on chronic combined systolic and diastolic CHF (congestive heart failure) ([place]) 09/17/2019  . CAD (coronary artery disease) 09/17/2019  . Ischemic cardiomyopathy 09/17/2019  . CKD (chronic kidney disease) stage 3, GFR 30-59 ml/min ([name]) 09/17/2019  . Acute hypoxemic respiratory failure ([place]) 09/17/2018  . AAA (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 ([place]) 07/06/2018  . Essential hypertension 07/06/2018  . Hyperlipidemia 07/06/2018  . Paroxysmal atrial fibrillation ([name])   . Acute on chronic respiratory failure with hypoxia ([place]) 04/26/2018      Prior to Admission medications   Medication Sig Start Date End Date Taking? Authorizing Provider  acetaminophen (TYLENOL) 325 MG tablet Take 2 tablets (650 mg total) by mouth every 4 (four) hours as needed for headache or mild pain. 10/29/19   Bradly Bienenstock, NP  albuterol (PROVENTIL) (2.5 MG/3ML) 0.083% nebulizer solution Inhale 3 mLs (2.5 mg total) into the lungs as needed for shortness of breath. 10/29/19   Bradly Bienenstock, NP  amiodarone (PACERONE) 400 MG tablet Take 1 tablet (400 mg total) by mouth daily. 12/16/19   Pokhrel, Corrie Mckusick, MD  clopidogrel (PLAVIX) 75 MG tablet  Take 1 tablet (75 mg total) by mouth daily. 10/29/19   Bradly Bienenstock, NP  Fluticasone-Salmeterol (ADVAIR) 500-50 MCG/DOSE AEPB Inhale 1 puff into the lungs 2 (two) times daily. 12/09/19   [provider]  gabapentin (NEURONTIN) 100 MG capsule Take 100 mg by mouth at bedtime. 12/09/19   [provider]  HYDROcodone-acetaminophen (NORCO/VICODIN) 5-325 MG tablet Take 1 tablet by mouth every 6 (six) hours as needed for severe pain. 01/17/20   Lavonia Drafts, MD  ipratropium-albuterol (DUONEB) 0.5-2.5 (3) MG/3ML SOLN Take 3 mLs by nebulization 3 (three) times daily. 10/29/19   Bradly Bienenstock, NP  magnesium chloride (SLOW-MAG) 64 MG TBEC SR tablet Take 1 tablet (64 mg total) by mouth daily. 10/29/19   Bradly Bienenstock, NP  melatonin 5 MG TABS Take 0.5 tablets (2.5 mg total) by mouth at bedtime as needed (sleep). 10/29/19   Bradly Bienenstock, NP  metoprolol succinate (TOPROL-XL) 100 MG 24 hr tablet Take 100 mg by mouth daily. 12/03/19   [provider]  mexiletine (MEXITIL) 150 MG capsule Take 150 mg by mouth every 8 (eight) hours. 12/01/19   [provider]  Multiple Vitamin (MULTIVITAMIN WITH MINERALS) TABS tablet Take 1 tablet by mouth daily. 10/29/19   Bradly Bienenstock, NP  pantoprazole (PROTONIX) 40 MG tablet Take 1 tablet (40 mg total) by mouth daily. 10/29/19   Darel Hong D, NP  spironolactone (ALDACTONE) 25 MG tablet Take 12.5 mg by mouth daily at 6 (six) AM. 12/09/19   [provider]  tamsulosin (FLOMAX) 0.4 MG CAPS capsule Take 0.4 mg by mouth daily. 12/09/19   [provider]    Allergies Patient has no known allergies.    Social History Social History   Tobacco Use  . Smoking status: Former Smoker    Packs/day: 1.00    Years: 38.00    Pack years: 38.00    Types: Cigarettes    Quit date: 06/18/1999    Years since quitting: 20.9  . Smokeless tobacco: Never Used  Vaping Use  . Vaping Use: Never used  Substance Use Topics  . Alcohol use: No    Alcohol/week: 0.0 standard drinks  . Drug use: Not Currently    Review of Systems Patient denies headaches, rhinorrhea, blurry vision, numbness, shortness of breath, chest pain, edema, cough, abdominal pain, nausea, vomiting, diarrhea, dysuria, fevers, rashes or hallucinations unless otherwise stated above in HPI. ____________________________________________   PHYSICAL EXAM:  VITAL SIGNS: Vitals:   05/10/20 2243 05/10/20 2248  BP:  125/70 (!) 98/59  Pulse: 75 95  Resp: 16 18  Temp:    SpO2: 100% 97%    Constitutional: Alert and oriented.  Eyes: Conjunctivae are normal.  Head: Atraumatic. Nose: No congestion/rhinnorhea. Mouth/Throat: Mucous membranes are moist.   Neck: No stridor. Painless ROM.  Cardiovascular: Normal rate, regular rhythm. Grossly normal heart sounds.  Good peripheral circulation. Respiratory: Normal respiratory effort.  No retractions. Lungs CTAB. Gastrointestinal: Soft. No distention. No abdominal bruits. No CVA tenderness. Genitourinary:  Musculoskeletal: No lower extremity tenderness nor edema.  No joint effusions. Neurologic:  Normal speech and language. No gross focal neurologic deficits are appreciated. No facial droop Skin:  Skin is warm, dry and intact. No rash noted. Psychiatric: Mood and affect are normal. Speech and behavior are normal.  ____________________________________________   LABS (all labs ordered are listed, but only abnormal results are displayed)  Results for orders placed or performed during the hospital encounter of 05/10/20 (from the past 24  hour(s))  Basic metabolic panel     Status: Abnormal   Collection Time: 05/10/20  6:38 PM  Result Value Ref Range   Sodium 132 (L) 135 - 145 mmol/L   Potassium 4.4 3.5 - 5.1 mmol/L   Chloride 99 98 - 111 mmol/L   CO2 23 22 - 32 mmol/L   Glucose, Bld 110 (H) 70 - 99 mg/dL   BUN 17 8 - 23 mg/dL   Creatinine, Ser 1.71 (H) 0.61 - 1.24 mg/dL   Calcium 9.4 8.9 - 10.3 mg/dL   GFR, Estimated 40 (L) >60 mL/min   Anion gap 10 5 - 15  CBC     Status: Abnormal   Collection Time: 05/10/20  6:38 PM  Result Value Ref Range   WBC 7.0 4.0 - 10.5 K/uL   RBC 3.84 (L) 4.22 - 5.81 MIL/uL   Hemoglobin 11.5 (L) 13.0 - 17.0 g/dL   HCT 37.0 (L) 39 - 52 %   MCV 96.4 80.0 - 100.0 fL   MCH 29.9 26.0 - 34.0 pg   MCHC 31.1 30.0 - 36.0 g/dL   RDW 12.8 11.5 - 15.5 %   Platelets 228 150 - 400 K/uL   nRBC 0.0 0.0 - 0.2 %  Troponin I (High  Sensitivity)     Status: None   Collection Time: 05/10/20  6:38 PM  Result Value Ref Range   Troponin I (High Sensitivity) 15 <18 ng/L  CBG monitoring, ED     Status: None   Collection Time: 05/10/20 11:00 PM  Result Value Ref Range   Glucose-Capillary 94 70 - 99 mg/dL   ____________________________________________  EKG My review and personal interpretation at Time: 18:40   Indication: weakness  Rate: 90  Rhythm: sinus Axis: normal  Other: normal intervals, non specific st abn, no stemi ____________________________________________  RADIOLOGY  I personally reviewed all radiographic images ordered to evaluate for the above acute complaints and reviewed radiology reports and findings.  These findings were personally discussed with the patient.  Please see medical record for radiology report.  ____________________________________________   PROCEDURES  Procedure(s) performed:  Procedures    Critical Care performed: no ____________________________________________   INITIAL IMPRESSION / ASSESSMENT AND PLAN / ED COURSE  Pertinent labs & imaging results that were available during my care of the patient were reviewed by me and considered in my medical decision making (see chart for details).   DDX: Hydration, orthostasis, SBO, mass, diverticulitis, colitis, ACS, dysrhythmia  KACHE MCCLURG is a 79 y.o. who presents to the ED with presentation as described above.  He is nontoxic-appearing but chronically ill does appear somewhat dehydrated and describing symptoms consistent with orthostasis.  He is orthostatic on exam here.  Is having poor p.o. intake associated nausea vomiting decreased bowel movements therefore will order CT imaging to evaluate for the by differential.  Will give IV fluids.  His blood work is otherwise reassuring other than chronic kidney disease.  Troponin is negative.  Patient be signed out to oncoming physician pending follow-up on CT imaging and reassess.  If  he is feeling better after IV fluids and CT is normal I think he may be appropriate for outpatient follow-up as long as he is still tolerating p.o.     The patient was evaluated in Emergency Department today for the symptoms described in the history of present illness. He/she was evaluated in the context of the global COVID-19 pandemic, which necessitated consideration that the patient might be at risk for infection  with the SARS-CoV-2 virus that causes COVID-19. Institutional protocols and algorithms that pertain to the evaluation of patients at risk for COVID-19 are in a state of rapid change based on information released by regulatory bodies including the CDC and federal and state organizations. These policies and algorithms were followed during the patient's care in the ED.  As part of my medical decision making, I reviewed the following data within the [place] notes reviewed and incorporated, Labs reviewed, notes from prior ED visits and [place] Controlled Substance Database   ____________________________________________   FINAL CLINICAL IMPRESSION(S) / ED DIAGNOSES  Final diagnoses:  Orthostasis      NEW MEDICATIONS STARTED DURING THIS VISIT:  New Prescriptions   No medications on file     Note:  This document was prepared using Dragon voice recognition software and may include unintentional dictation errors.    Merlyn Lot, MD 05/10/20 2348

## 2020-05-10 NOTE — ED Notes (Signed)
Lab called to add on Troponin to previous collection. Per lab staff, will be put into process at this time.

## 2020-05-10 NOTE — ED Notes (Signed)
Patient assessed and placed on cardiac monitor. Non-skid socks applied as well as fall band. Bed low and locked with side rails raised x2 and call bell in reach. Patient alert and oriented x4, breathing unlabored and speaking in full sentences. Symmetric chest rise and fall noted though patient's voice noted to be hoarse and pt unable to recall when the voice change onset. RN informed pt that ex-wife Joellen Jersey had called inquiring as to status and asked the patient if RN could speak with her. Patient provided verbal consent for RN to speak with Joellen [place] and second RN Joseph Art was witness to conversation/consent. Joellen Jersey can be reached at the following number [phone].

## 2020-05-11 NOTE — ED Notes (Signed)
Pt ambulated with minimal assistance ( used walker ) . MD Karma Greaser advised

## 2020-05-11 NOTE — ED Notes (Signed)
Pt calm , collective , denied pain or sob  

## 2020-05-11 NOTE — Discharge Instructions (Addendum)
Your workup including labs and EKG show reassuring results.  Your symptoms may be due to dehydration, so it is important that you drink plenty of non-alcoholic fluids.  Please call your regular doctor as soon as possible to schedule the next available clinic appointment to follow up with him/her regarding your visit to the ED and your symptoms.  Return to the Emergency Department (ED)  if you have any further syncopal episodes (pass out again) or develop ANY chest pain, pressure, tightness, trouble breathing, sudden sweating, or other symptoms that concern you.  Please also mention to your primary care doctor that your CT scan shows an incidental finding of a small abdominal aortic aneurysm.  There is nothing to do about this right now but your doctor needs to know about it so that he can order future ultrasounds to keep an eye on the aneurysm and make sure it is not getting bigger.

## 2021-04-09 NOTE — Progress Notes (Incomplete)
04/10/21 2:12 PM   Matthew Zamora 19-Feb-1941 [id number]  Referring provider:  Maryland Pink, MD [address] [facility] [place],  [place] [address] No chief complaint on file.    HPI: Matthew Zamora is a 80 y.o.male who presents today for further evaluation and care of prostate cancer and dysuria.   He also has a history of CKD III.   He is s/p prostatectomy.   He is currently on tamsulosin and tadalafil.        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 pylori infection    History of hepatitis    Hyperlipidemia    Hypertension    Liver cyst 05/16/15   PAF (paroxysmal atrial fibrillation) ([place])    Prostate CA [facility])     Surgical History: Past Surgical History:  Procedure Laterality Date   CATARACT EXTRACTION     COLONOSCOPY  10/06/2014, 09/18/2004, 03/14/2009   ESOPHAGOGASTRODUODENOSCOPY  10/06/2014, 03/18/2001, 03/14/2009   ESOPHAGOGASTRODUODENOSCOPY (EGD) WITH PROPOFOL N/A 05/07/2018   Procedure: ESOPHAGOGASTRODUODENOSCOPY (EGD) WITH PROPOFOL;  Surgeon: Toledo, [name], MD;  Location: [facility] ENDOSCOPY;  Service: Gastroenterology;  Laterality: N/A;   FLEXIBLE SIGMOIDOSCOPY  08/26/1990   INSERTION OF ICD     PROSTATE SURGERY     TONSILLECTOMY      Home Medications:  Allergies as of 04/10/2021   No Known Allergies      Medication List        Accurate as of April 10, 2021  2:12 PM. If you have any questions, ask your nurse or doctor.          acetaminophen 325 MG tablet Commonly known as: TYLENOL Take 2 tablets (650 mg total) by mouth every 4 (four) hours as needed for headache or mild  pain.   albuterol (2.5 MG/3ML) 0.083% nebulizer solution Commonly known as: PROVENTIL Inhale 3 mLs (2.5 mg total) into the lungs as needed for shortness of breath.   amiodarone 400 MG tablet Commonly known as: PACERONE Take 1 tablet (400 mg total) by mouth daily.   clopidogrel 75 MG tablet Commonly known as: PLAVIX Take 1 tablet (75 mg total) by mouth daily.   Fluticasone-Salmeterol 500-50 MCG/DOSE Aepb Commonly known as: ADVAIR Inhale 1 puff into the lungs 2 (two) times daily.   gabapentin 100 MG capsule Commonly known as: NEURONTIN Take 100 mg by mouth at bedtime.   HYDROcodone-acetaminophen 5-325 MG tablet Commonly known as: NORCO/VICODIN Take 1 tablet by mouth every 6 (six) hours as needed for severe pain.   ipratropium-albuterol 0.5-2.5 (3) MG/3ML Soln Commonly known as: DUONEB Take 3 mLs by nebulization 3 (three) times daily.   magnesium chloride 64 MG Tbec SR tablet Commonly known as: SLOW-MAG Take 1 tablet (64 mg total) by mouth daily.   melatonin 5 MG Tabs Take 0.5 tablets (2.5 mg total) by mouth at bedtime as needed (sleep).   metoprolol succinate 100 MG 24 hr tablet Commonly known as: TOPROL-XL Take 100 mg by mouth daily.   mexiletine 150 MG capsule Commonly known as: MEXITIL Take 150 mg by mouth every 8 (eight) hours.   multivitamin  with minerals Tabs tablet Take 1 tablet by mouth daily.   pantoprazole 40 MG tablet Commonly known as: PROTONIX Take 1 tablet (40 mg total) by mouth daily.   spironolactone 25 MG tablet Commonly known as: ALDACTONE Take 12.5 mg by mouth daily at 6 (six) AM.   tamsulosin 0.4 MG Caps capsule Commonly known as: FLOMAX Take 0.4 mg by mouth daily.        Allergies: No Known Allergies  Family History: Family History  Problem Relation Age of Onset   Heart attack Mother    Heart attack Father     Social History:  reports that he quit smoking about 21 years ago. His smoking use included cigarettes. He has a 38.00  pack-year smoking history. He has never used smokeless tobacco. He reports that he does not currently use drugs. He reports that he does not drink alcohol.   Physical Exam: There were no vitals taken for this visit.  Constitutional:  Alert and oriented, No acute distress. HEENT: [place] AT, moist mucus membranes.  Trachea midline, no masses. Cardiovascular: No clubbing, cyanosis, or edema. Respiratory: Normal respiratory effort, no increased work of breathing. Skin: No rashes, bruises or suspicious lesions. Neurologic: Grossly intact, no focal deficits, moving all 4 extremities. Psychiatric: Normal mood and affect.  Laboratory Data:  Lab Results  Component Value Date   CREATININE 1.71 (H) 05/10/2020    Urinalysis  Pertinent Imaging:    Assessment & Plan:     No follow-ups on file. I,Kailey Littlejohn,acting as a Education administrator for Hollice Espy, MD.,have documented all relevant documentation on the behalf of Hollice Espy, MD,as directed by  Hollice Espy, MD while in the presence of Hollice Espy, [place] [address] [place], [place] [address] [phone]

## 2021-04-10 ENCOUNTER — Ambulatory Visit: Payer: Medicare Other | Admitting: Urology

## 2021-04-12 ENCOUNTER — Encounter: Payer: Self-pay | Admitting: Urology

## 2021-06-12 ENCOUNTER — Emergency Department: Payer: Medicare Other

## 2021-06-12 ENCOUNTER — Other Ambulatory Visit: Payer: Self-pay

## 2021-06-12 DIAGNOSIS — Z79899 Other long term (current) drug therapy: Secondary | ICD-10-CM

## 2021-06-12 DIAGNOSIS — I5023 Acute on chronic systolic (congestive) heart failure: Secondary | ICD-10-CM | POA: Diagnosis present

## 2021-06-12 DIAGNOSIS — J9601 Acute respiratory failure with hypoxia: Secondary | ICD-10-CM | POA: Diagnosis not present

## 2021-06-12 DIAGNOSIS — Z87891 Personal history of nicotine dependence: Secondary | ICD-10-CM

## 2021-06-12 DIAGNOSIS — K7689 Other specified diseases of liver: Secondary | ICD-10-CM | POA: Diagnosis present

## 2021-06-12 DIAGNOSIS — Z7902 Long term (current) use of antithrombotics/antiplatelets: Secondary | ICD-10-CM

## 2021-06-12 DIAGNOSIS — Z7901 Long term (current) use of anticoagulants: Secondary | ICD-10-CM

## 2021-06-12 DIAGNOSIS — N179 Acute kidney failure, unspecified: Secondary | ICD-10-CM | POA: Diagnosis present

## 2021-06-12 DIAGNOSIS — Z8546 Personal history of malignant neoplasm of prostate: Secondary | ICD-10-CM

## 2021-06-12 DIAGNOSIS — Z8249 Family history of ischemic heart disease and other diseases of the circulatory system: Secondary | ICD-10-CM

## 2021-06-12 DIAGNOSIS — Z95810 Presence of automatic (implantable) cardiac defibrillator: Secondary | ICD-10-CM

## 2021-06-12 DIAGNOSIS — I130 Hypertensive heart and chronic kidney disease with heart failure and stage 1 through stage 4 chronic kidney disease, or unspecified chronic kidney disease: Secondary | ICD-10-CM | POA: Diagnosis not present

## 2021-06-12 DIAGNOSIS — Z20822 Contact with and (suspected) exposure to covid-19: Secondary | ICD-10-CM | POA: Diagnosis present

## 2021-06-12 DIAGNOSIS — N1832 Chronic kidney disease, stage 3b: Secondary | ICD-10-CM | POA: Diagnosis present

## 2021-06-12 DIAGNOSIS — I248 Other forms of acute ischemic heart disease: Secondary | ICD-10-CM | POA: Diagnosis present

## 2021-06-12 DIAGNOSIS — J441 Chronic obstructive pulmonary disease with (acute) exacerbation: Secondary | ICD-10-CM | POA: Diagnosis present

## 2021-06-12 DIAGNOSIS — Z7951 Long term (current) use of inhaled steroids: Secondary | ICD-10-CM

## 2021-06-12 DIAGNOSIS — E785 Hyperlipidemia, unspecified: Secondary | ICD-10-CM | POA: Diagnosis present

## 2021-06-12 DIAGNOSIS — I2510 Atherosclerotic heart disease of native coronary artery without angina pectoris: Secondary | ICD-10-CM | POA: Diagnosis present

## 2021-06-12 DIAGNOSIS — I480 Paroxysmal atrial fibrillation: Secondary | ICD-10-CM | POA: Diagnosis present

## 2021-06-12 DIAGNOSIS — R911 Solitary pulmonary nodule: Secondary | ICD-10-CM | POA: Diagnosis present

## 2021-06-12 DIAGNOSIS — D72828 Other elevated white blood cell count: Secondary | ICD-10-CM | POA: Diagnosis present

## 2021-06-12 DIAGNOSIS — K219 Gastro-esophageal reflux disease without esophagitis: Secondary | ICD-10-CM | POA: Diagnosis present

## 2021-06-12 LAB — CBC
HCT: 35.6 % — ABNORMAL LOW (ref 39.0–52.0)
Hemoglobin: 11.5 g/dL — ABNORMAL LOW (ref 13.0–17.0)
MCH: 31.3 pg (ref 26.0–34.0)
MCHC: 32.3 g/dL (ref 30.0–36.0)
MCV: 96.7 fL (ref 80.0–100.0)
Platelets: 192 10*3/uL (ref 150–400)
RBC: 3.68 MIL/uL — ABNORMAL LOW (ref 4.22–5.81)
RDW: 13.7 % (ref 11.5–15.5)
WBC: 13.2 10*3/uL — ABNORMAL HIGH (ref 4.0–10.5)
nRBC: 0 % (ref 0.0–0.2)

## 2021-06-12 LAB — BASIC METABOLIC PANEL
Anion gap: 9 (ref 5–15)
BUN: 30 mg/dL — ABNORMAL HIGH (ref 8–23)
CO2: 24 mmol/L (ref 22–32)
Calcium: 9.5 mg/dL (ref 8.9–10.3)
Chloride: 100 mmol/L (ref 98–111)
Creatinine, Ser: 2.25 mg/dL — ABNORMAL HIGH (ref 0.61–1.24)
GFR, Estimated: 29 mL/min — ABNORMAL LOW (ref >60)
Glucose, Bld: 102 mg/dL — ABNORMAL HIGH (ref 70–99)
Potassium: 4.1 mmol/L (ref 3.5–5.1)
Sodium: 133 mmol/L — ABNORMAL LOW (ref 135–145)

## 2021-06-12 NOTE — ED Provider Notes (Signed)
Emergency Medicine Provider Triage Evaluation Note  KHRYSTIAN SCHAUF, a 81 y.o. male  was evaluated in triage.  Pt complains of productive cough.  Patient presents via EMS from home, with 24 hours of cough and productive green sputum.  He had some nausea yesterday but denies any vomiting.  He thinks he has pneumonia.  Denies any frank fevers, chest pain, shortness of breath, or dizziness.  Review of Systems  Positive: Productive cough Negative: FCS  Physical Exam  BP 113/62    Pulse 70    Temp 97.6 F (36.4 C) (Oral)    Resp 20    Ht 5\' 8"  (1.727 m)    Wt 68 kg    SpO2 93%    BMI 22.81 kg/m  Gen:   Awake, no distress  AND Resp:  Normal effort CTA MSK:   Moves extremities without difficulty  Other:  CVS: RRR  Medical Decision Making  Medically screening exam initiated at 6:46 PM.  Appropriate orders placed.  KEYONTE COOKSTON was informed that the remainder of the evaluation will be completed by another provider, this initial triage assessment does not replace that evaluation, and the importance of remaining in the ED until their evaluation is complete.  Geriatric patient ED evaluation productive cough since yesterday.  Patient presents with concern for pneumonia.   Melvenia Needles, PA-C 06/12/21 1851    Arta Silence, MD 06/12/21 [phone]

## 2021-06-12 NOTE — ED Triage Notes (Signed)
Pt to ed for shob and cough, spitting up green sputum. States thinks has PNA.  Productive cough noted in triage.  Does not wear O2 at home

## 2021-06-12 NOTE — ED Notes (Signed)
First nurse-pt brought in via ems from home with weakness and sob for 3 days.  Oxygen sats 90% room air.  Placed on 2 liters. Sats 98% per ems.  VS WNL.  Pt alert, in the lobby

## 2021-06-13 ENCOUNTER — Inpatient Hospital Stay
Admission: EM | Admit: 2021-06-13 | Discharge: 2021-06-16 | DRG: 291 | Disposition: A | Discharge status: Home Health | Payer: Medicare Other | Attending: Internal Medicine | Admitting: Internal Medicine

## 2021-06-13 ENCOUNTER — Inpatient Hospital Stay
Admit: 2021-06-13 | Discharge: 2021-06-13 | Disposition: A | Discharge status: Home / Self Care | Payer: Medicare Other | Attending: Internal Medicine | Admitting: Internal Medicine

## 2021-06-13 ENCOUNTER — Emergency Department: Payer: Medicare Other

## 2021-06-13 DIAGNOSIS — E785 Hyperlipidemia, unspecified: Secondary | ICD-10-CM

## 2021-06-13 DIAGNOSIS — J9601 Acute respiratory failure with hypoxia: Secondary | ICD-10-CM | POA: Diagnosis present

## 2021-06-13 DIAGNOSIS — Z79899 Other long term (current) drug therapy: Secondary | ICD-10-CM | POA: Diagnosis not present

## 2021-06-13 DIAGNOSIS — N1832 Chronic kidney disease, stage 3b: Secondary | ICD-10-CM

## 2021-06-13 DIAGNOSIS — R911 Solitary pulmonary nodule: Secondary | ICD-10-CM | POA: Diagnosis present

## 2021-06-13 DIAGNOSIS — N183 Chronic kidney disease, stage 3 unspecified: Secondary | ICD-10-CM | POA: Diagnosis not present

## 2021-06-13 DIAGNOSIS — Z8249 Family history of ischemic heart disease and other diseases of the circulatory system: Secondary | ICD-10-CM | POA: Diagnosis not present

## 2021-06-13 DIAGNOSIS — J441 Chronic obstructive pulmonary disease with (acute) exacerbation: Secondary | ICD-10-CM

## 2021-06-13 DIAGNOSIS — Z8546 Personal history of malignant neoplasm of prostate: Secondary | ICD-10-CM | POA: Diagnosis not present

## 2021-06-13 DIAGNOSIS — I2510 Atherosclerotic heart disease of native coronary artery without angina pectoris: Secondary | ICD-10-CM | POA: Diagnosis present

## 2021-06-13 DIAGNOSIS — K7689 Other specified diseases of liver: Secondary | ICD-10-CM

## 2021-06-13 DIAGNOSIS — I248 Other forms of acute ischemic heart disease: Secondary | ICD-10-CM | POA: Diagnosis present

## 2021-06-13 DIAGNOSIS — Z7951 Long term (current) use of inhaled steroids: Secondary | ICD-10-CM | POA: Diagnosis not present

## 2021-06-13 DIAGNOSIS — I5023 Acute on chronic systolic (congestive) heart failure: Secondary | ICD-10-CM | POA: Diagnosis present

## 2021-06-13 DIAGNOSIS — I130 Hypertensive heart and chronic kidney disease with heart failure and stage 1 through stage 4 chronic kidney disease, or unspecified chronic kidney disease: Secondary | ICD-10-CM | POA: Diagnosis present

## 2021-06-13 DIAGNOSIS — Z95810 Presence of automatic (implantable) cardiac defibrillator: Secondary | ICD-10-CM | POA: Diagnosis not present

## 2021-06-13 DIAGNOSIS — D72828 Other elevated white blood cell count: Secondary | ICD-10-CM | POA: Diagnosis present

## 2021-06-13 DIAGNOSIS — N179 Acute kidney failure, unspecified: Secondary | ICD-10-CM | POA: Diagnosis present

## 2021-06-13 DIAGNOSIS — K219 Gastro-esophageal reflux disease without esophagitis: Secondary | ICD-10-CM | POA: Diagnosis present

## 2021-06-13 DIAGNOSIS — Z20822 Contact with and (suspected) exposure to covid-19: Secondary | ICD-10-CM | POA: Diagnosis present

## 2021-06-13 DIAGNOSIS — I10 Essential (primary) hypertension: Secondary | ICD-10-CM

## 2021-06-13 DIAGNOSIS — Z7902 Long term (current) use of antithrombotics/antiplatelets: Secondary | ICD-10-CM | POA: Diagnosis not present

## 2021-06-13 DIAGNOSIS — Z7901 Long term (current) use of anticoagulants: Secondary | ICD-10-CM | POA: Diagnosis not present

## 2021-06-13 DIAGNOSIS — J189 Pneumonia, unspecified organism: Secondary | ICD-10-CM

## 2021-06-13 DIAGNOSIS — E7849 Other hyperlipidemia: Secondary | ICD-10-CM | POA: Diagnosis not present

## 2021-06-13 DIAGNOSIS — I480 Paroxysmal atrial fibrillation: Secondary | ICD-10-CM

## 2021-06-13 DIAGNOSIS — Z87891 Personal history of nicotine dependence: Secondary | ICD-10-CM | POA: Diagnosis not present

## 2021-06-13 DIAGNOSIS — R778 Other specified abnormalities of plasma proteins: Secondary | ICD-10-CM

## 2021-06-13 DIAGNOSIS — R52 Pain, unspecified: Secondary | ICD-10-CM

## 2021-06-13 LAB — ECHOCARDIOGRAM COMPLETE
AR max vel: 2.62 cm2
AV Area VTI: 2.9 cm2
AV Area mean vel: 2.63 cm2
AV Mean grad: 1.5 mmHg
AV Peak grad: 2.9 mmHg
Ao pk vel: 0.85 m/s
Area-P 1/2: 4.86 cm2
MV VTI: 1.57 cm2
S' Lateral: 3.2 cm

## 2021-06-13 LAB — RESP PANEL BY RT-PCR (FLU A&B, COVID) ARPGX2
Influenza A by PCR: NEGATIVE
Influenza B by PCR: NEGATIVE
SARS Coronavirus 2 by RT PCR: NEGATIVE

## 2021-06-13 LAB — HEMOGLOBIN A1C
Hgb A1c MFr Bld: 5.1 % (ref 4.8–5.6)
Mean Plasma Glucose: 99.67 mg/dL

## 2021-06-13 LAB — PROCALCITONIN: Procalcitonin: <0.1 ng/mL

## 2021-06-13 LAB — TROPONIN I (HIGH SENSITIVITY)
Troponin I (High Sensitivity): 37 ng/L — ABNORMAL HIGH (ref <18)
Troponin I (High Sensitivity): 29 ng/L — ABNORMAL HIGH (ref <18)
Troponin I (High Sensitivity): 26 ng/L — ABNORMAL HIGH (ref <18)

## 2021-06-13 LAB — BRAIN NATRIURETIC PEPTIDE: B Natriuretic Peptide: 337.8 pg/mL — ABNORMAL HIGH (ref 0.0–100.0)

## 2021-06-13 MED ORDER — SODIUM CHLORIDE 0.9 % IV SOLN: 1.0000 g | Freq: Once | INTRAVENOUS | Status: DC

## 2021-06-13 MED ORDER — HYDROCODONE-ACETAMINOPHEN 5-325 MG PO TABS: 1.0000 | ORAL_TABLET | Freq: Four times a day (QID) | ORAL | Status: DC | PRN

## 2021-06-13 MED ORDER — MAGNESIUM OXIDE -MG SUPPLEMENT 400 (240 MG) MG PO TABS
400.0000 mg | ORAL_TABLET | Freq: Every day | ORAL | Status: DC
  Administered 2021-06-13 – 2021-06-16 (×4): 400 mg via ORAL
  Filled 2021-06-13 (×4): qty 1

## 2021-06-13 MED ORDER — METHYLPREDNISOLONE SODIUM SUCC 40 MG IJ SOLR
40.0000 mg | Freq: Two times a day (BID) | INTRAMUSCULAR | Status: DC
  Administered 2021-06-13: 22:00:00 40 mg via INTRAVENOUS
  Filled 2021-06-13: qty 1

## 2021-06-13 MED ORDER — FUROSEMIDE 10 MG/ML IJ SOLN
40.0000 mg | Freq: Two times a day (BID) | INTRAMUSCULAR | Status: DC
  Administered 2021-06-13 – 2021-06-14 (×2): 40 mg via INTRAVENOUS
  Filled 2021-06-13 (×2): qty 4

## 2021-06-13 MED ORDER — METHYLPREDNISOLONE SODIUM SUCC 125 MG IJ SOLR
125.0000 mg | Freq: Once | INTRAMUSCULAR | Status: AC
  Administered 2021-06-13: 06:00:00 125 mg via INTRAVENOUS
  Filled 2021-06-13: qty 2

## 2021-06-13 MED ORDER — HYDRALAZINE HCL 20 MG/ML IJ SOLN: 5.0000 mg | INTRAMUSCULAR | Status: DC | PRN

## 2021-06-13 MED ORDER — SODIUM CHLORIDE 0.9 % IV SOLN
1.0000 g | Freq: Once | INTRAVENOUS | Status: AC
  Administered 2021-06-13: 06:00:00 1 g via INTRAVENOUS
  Filled 2021-06-13: qty 1

## 2021-06-13 MED ORDER — METOPROLOL SUCCINATE ER 50 MG PO TB24
75.0000 mg | ORAL_TABLET | Freq: Two times a day (BID) | ORAL | Status: DC
  Administered 2021-06-13 – 2021-06-16 (×6): 75 mg via ORAL
  Filled 2021-06-13 (×4): qty 1
  Filled 2021-06-13 (×2): qty 2
  Filled 2021-06-13 (×2): qty 1

## 2021-06-13 MED ORDER — IPRATROPIUM-ALBUTEROL 0.5-2.5 (3) MG/3ML IN SOLN
3.0000 mL | RESPIRATORY_TRACT | Status: DC
  Administered 2021-06-13 – 2021-06-14 (×6): 3 mL via RESPIRATORY_TRACT
  Filled 2021-06-13 (×7): qty 3

## 2021-06-13 MED ORDER — PANTOPRAZOLE SODIUM 40 MG PO TBEC
40.0000 mg | DELAYED_RELEASE_TABLET | Freq: Every day | ORAL | Status: DC
  Administered 2021-06-13 – 2021-06-16 (×4): 40 mg via ORAL
  Filled 2021-06-13 (×4): qty 1

## 2021-06-13 MED ORDER — GABAPENTIN 100 MG PO CAPS
100.0000 mg | ORAL_CAPSULE | Freq: Every day | ORAL | Status: DC
  Administered 2021-06-13 – 2021-06-15 (×3): 100 mg via ORAL
  Filled 2021-06-13 (×3): qty 1

## 2021-06-13 MED ORDER — SODIUM CHLORIDE 0.9 % IV SOLN: 2.0000 g | INTRAVENOUS | Status: DC

## 2021-06-13 MED ORDER — ENOXAPARIN SODIUM 30 MG/0.3ML IJ SOSY
30.0000 mg | PREFILLED_SYRINGE | INTRAMUSCULAR | Status: DC
  Administered 2021-06-13: 09:00:00 30 mg via SUBCUTANEOUS
  Filled 2021-06-13: qty 0.3

## 2021-06-13 MED ORDER — ADULT MULTIVITAMIN W/MINERALS CH
1.0000 | ORAL_TABLET | Freq: Every day | ORAL | Status: DC
  Administered 2021-06-13 – 2021-06-16 (×4): 1 via ORAL
  Filled 2021-06-13 (×4): qty 1

## 2021-06-13 MED ORDER — ATORVASTATIN CALCIUM 20 MG PO TABS
80.0000 mg | ORAL_TABLET | Freq: Every day | ORAL | Status: DC
  Administered 2021-06-13 – 2021-06-16 (×4): 80 mg via ORAL
  Filled 2021-06-13 (×4): qty 4

## 2021-06-13 MED ORDER — MELATONIN 5 MG PO TABS
10.0000 mg | ORAL_TABLET | Freq: Every day | ORAL | Status: DC
  Administered 2021-06-13 – 2021-06-15 (×3): 10 mg via ORAL
  Filled 2021-06-13 (×4): qty 2

## 2021-06-13 MED ORDER — ACETAMINOPHEN 325 MG PO TABS: 650.0000 mg | ORAL_TABLET | Freq: Four times a day (QID) | ORAL | Status: DC | PRN

## 2021-06-13 MED ORDER — MEXILETINE HCL 150 MG PO CAPS
150.0000 mg | ORAL_CAPSULE | Freq: Three times a day (TID) | ORAL | Status: DC
  Administered 2021-06-13 – 2021-06-16 (×10): 150 mg via ORAL
  Filled 2021-06-13 (×13): qty 1

## 2021-06-13 MED ORDER — ONDANSETRON HCL 4 MG/2ML IJ SOLN: 4.0000 mg | Freq: Three times a day (TID) | INTRAMUSCULAR | Status: DC | PRN

## 2021-06-13 MED ORDER — DM-GUAIFENESIN ER 30-600 MG PO TB12
1.0000 | ORAL_TABLET | Freq: Two times a day (BID) | ORAL | Status: DC | PRN
  Administered 2021-06-15 (×2): 1 via ORAL
  Filled 2021-06-13 (×2): qty 1

## 2021-06-13 MED ORDER — MOMETASONE FURO-FORMOTEROL FUM 200-5 MCG/ACT IN AERO
2.0000 | INHALATION_SPRAY | Freq: Two times a day (BID) | RESPIRATORY_TRACT | Status: DC
  Administered 2021-06-14 – 2021-06-16 (×5): 2 via RESPIRATORY_TRACT
  Filled 2021-06-13: qty 8.8

## 2021-06-13 MED ORDER — AZITHROMYCIN 500 MG PO TABS: 500.0000 mg | ORAL_TABLET | Freq: Once | ORAL | Status: DC

## 2021-06-13 MED ORDER — IPRATROPIUM-ALBUTEROL 0.5-2.5 (3) MG/3ML IN SOLN
3.0000 mL | Freq: Once | RESPIRATORY_TRACT | Status: AC
  Administered 2021-06-13: 06:00:00 3 mL via RESPIRATORY_TRACT
  Filled 2021-06-13: qty 3

## 2021-06-13 MED ORDER — MELATONIN 5 MG PO TABS: 2.5000 mg | ORAL_TABLET | Freq: Every evening | ORAL | Status: DC | PRN

## 2021-06-13 MED ORDER — ASPIRIN 81 MG PO CHEW
81.0000 mg | CHEWABLE_TABLET | Freq: Every day | ORAL | Status: DC
  Administered 2021-06-13 – 2021-06-16 (×4): 81 mg via ORAL
  Filled 2021-06-13 (×4): qty 1

## 2021-06-13 MED ORDER — AZITHROMYCIN 500 MG PO TABS
500.0000 mg | ORAL_TABLET | Freq: Every day | ORAL | Status: AC
  Administered 2021-06-13: 18:00:00 500 mg via ORAL
  Filled 2021-06-13: qty 1

## 2021-06-13 MED ORDER — VANCOMYCIN HCL IN DEXTROSE 1-5 GM/200ML-% IV SOLN
1000.0000 mg | Freq: Once | INTRAVENOUS | Status: AC
  Administered 2021-06-13: 07:00:00 1000 mg via INTRAVENOUS
  Filled 2021-06-13: qty 200

## 2021-06-13 MED ORDER — VANCOMYCIN HCL 1250 MG/250ML IV SOLN: 1250.0000 mg | INTRAVENOUS | Status: DC

## 2021-06-13 MED ORDER — ALBUTEROL SULFATE (2.5 MG/3ML) 0.083% IN NEBU: 2.5000 mg | INHALATION_SOLUTION | RESPIRATORY_TRACT | Status: DC | PRN

## 2021-06-13 MED ORDER — AZITHROMYCIN 500 MG PO TABS
250.0000 mg | ORAL_TABLET | Freq: Every day | ORAL | Status: DC
  Administered 2021-06-14 – 2021-06-15 (×2): 250 mg via ORAL
  Filled 2021-06-13 (×2): qty 1

## 2021-06-13 MED ORDER — AMIODARONE HCL 200 MG PO TABS
400.0000 mg | ORAL_TABLET | Freq: Every day | ORAL | Status: DC
  Administered 2021-06-13 – 2021-06-16 (×4): 400 mg via ORAL
  Filled 2021-06-13 (×4): qty 2

## 2021-06-13 MED ORDER — APIXABAN 5 MG PO TABS
5.0000 mg | ORAL_TABLET | Freq: Two times a day (BID) | ORAL | Status: DC
  Administered 2021-06-13 – 2021-06-16 (×7): 5 mg via ORAL
  Filled 2021-06-13 (×7): qty 1

## 2021-06-13 MED ORDER — TAMSULOSIN HCL 0.4 MG PO CAPS
0.4000 mg | ORAL_CAPSULE | Freq: Every day | ORAL | Status: DC
  Administered 2021-06-13 – 2021-06-16 (×4): 0.4 mg via ORAL
  Filled 2021-06-13 (×4): qty 1

## 2021-06-13 NOTE — ED Notes (Signed)
Hospitalist at bedside 

## 2021-06-13 NOTE — ED Provider Notes (Signed)
[facility] Emergency Department Provider Note  ____________________________________________  Time seen: Approximately 5:38 AM  I have reviewed the triage vital signs and the nursing notes.   HISTORY  Chief Complaint Shortness of Breath   HPI Matthew Zamora is a 80 y.o. male history of CHF with a EF of 40%, AICD, Barrett's esophagitis, CAD on Plavix, hypertension, hyperlipidemia, paroxysmal atrial fibrillation, AAA without rupture who presents for evaluation of shortness of breath and cough.  Patient reports a progressively worsening productive cough and shortness of breath for the last for 5 days much worse in the last 48 hours.  He is coughing large amounts of thick green phlegm.  He has been wheezing and complaining of shortness of breath worse with minimal exertion.  He denies any fever or chills, no chest pain, no history of COPD or asthma.  Patient recently had surgery for a broken ankle 2 months ago for which she was intubated.  He denies any prior history of PE or DVT, no hemoptysis, no leg pain or swelling.   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 pylori infection    History of hepatitis    Hyperlipidemia    Hypertension    Liver cyst 05/16/15   PAF (paroxysmal atrial fibrillation) ([place])    Prostate CA [facility])     Patient Active Problem List   Diagnosis Date Noted   Volume depletion 12/15/2019   Hypotension 12/15/2019   Cardiorenal syndrome    Atrial fibrillation with RVR ([place])    Defibrillator discharge    Tachycardia 10/25/2019   CHF (congestive heart failure) ([place]) 09/18/2019   Acute on chronic  combined systolic and diastolic CHF (congestive heart failure) ([place]) 09/17/2019   CAD (coronary artery disease) 09/17/2019   Ischemic cardiomyopathy 09/17/2019   CKD (chronic kidney disease) stage 3, GFR 30-59 ml/min ([name]) 09/17/2019   Acute hypoxemic respiratory failure ([place]) 09/17/2018   AAA (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 07/06/2018   Essential hypertension 07/06/2018   Hyperlipidemia 07/06/2018   Paroxysmal atrial fibrillation ([place])    Acute on chronic respiratory failure with hypoxia ([place]) 04/26/2018    Past Surgical History:  Procedure Laterality Date   CATARACT EXTRACTION     COLONOSCOPY  10/06/2014, 09/18/2004, 03/14/2009   ESOPHAGOGASTRODUODENOSCOPY  10/06/2014, 03/18/2001, 03/14/2009   ESOPHAGOGASTRODUODENOSCOPY (EGD) WITH PROPOFOL N/A 05/07/2018   Procedure: ESOPHAGOGASTRODUODENOSCOPY (EGD) WITH PROPOFOL;  Surgeon: Toledo, [name], MD;  Location: [facility] ENDOSCOPY;  Service: Gastroenterology;  Laterality: N/A;   FLEXIBLE SIGMOIDOSCOPY  08/26/1990   INSERTION OF ICD     PROSTATE SURGERY     TONSILLECTOMY      Prior to Admission medications   Medication Sig Start Date End Date Taking? Authorizing Provider  acetaminophen (TYLENOL) 325 MG tablet Take 2 tablets (650 mg total) by mouth every 4 (four) hours as needed for headache or mild pain. 10/29/19   Bradly Bienenstock, NP  albuterol (PROVENTIL) (2.5 MG/3ML) 0.083% nebulizer solution Inhale 3 mLs (2.5 mg total) into the lungs as needed for shortness of breath. 10/29/19   Bradly Bienenstock, NP  amiodarone (PACERONE) 400 MG tablet Take 1  tablet (400 mg total) by mouth daily. 12/16/19   Pokhrel, Corrie Mckusick, MD  clopidogrel (PLAVIX) 75 MG tablet Take 1 tablet (75 mg total) by mouth daily. 10/29/19   Bradly Bienenstock, NP  Fluticasone-Salmeterol (ADVAIR) 500-50 MCG/DOSE AEPB Inhale 1 puff into the lungs 2 (two) times daily. 12/09/19   [provider]  gabapentin (NEURONTIN) 100 MG capsule Take 100 mg by mouth at bedtime. 12/09/19    [provider]  HYDROcodone-acetaminophen (NORCO/VICODIN) 5-325 MG tablet Take 1 tablet by mouth every 6 (six) hours as needed for severe pain. 01/17/20   Lavonia Drafts, MD  ipratropium-albuterol (DUONEB) 0.5-2.5 (3) MG/3ML SOLN Take 3 mLs by nebulization 3 (three) times daily. 10/29/19   Bradly Bienenstock, NP  magnesium chloride (SLOW-MAG) 64 MG TBEC SR tablet Take 1 tablet (64 mg total) by mouth daily. 10/29/19   Bradly Bienenstock, NP  melatonin 5 MG TABS Take 0.5 tablets (2.5 mg total) by mouth at bedtime as needed (sleep). 10/29/19   Bradly Bienenstock, NP  metoprolol succinate (TOPROL-XL) 100 MG 24 hr tablet Take 100 mg by mouth daily. 12/03/19   [provider]  mexiletine (MEXITIL) 150 MG capsule Take 150 mg by mouth every 8 (eight) hours. 12/01/19   [provider]  Multiple Vitamin (MULTIVITAMIN WITH MINERALS) TABS tablet Take 1 tablet by mouth daily. 10/29/19   Bradly Bienenstock, NP  pantoprazole (PROTONIX) 40 MG tablet Take 1 tablet (40 mg total) by mouth daily. 10/29/19   Darel Hong D, NP  spironolactone (ALDACTONE) 25 MG tablet Take 12.5 mg by mouth daily at 6 (six) AM. 12/09/19   [provider]  tamsulosin (FLOMAX) 0.4 MG CAPS capsule Take 0.4 mg by mouth daily. 12/09/19   [provider]    Allergies Patient has no known allergies.  Family History  Problem Relation Age of Onset   Heart attack Mother    Heart attack Father     Social History Social History   Tobacco Use   Smoking status: Former    Packs/day: 1.00    Years: 38.00    Pack years: 38.00    Types: Cigarettes    Quit date: 06/18/1999    Years since quitting: 22.0   Smokeless tobacco: Never  Vaping Use   Vaping Use: Never used  Substance Use Topics   Alcohol use: No    Alcohol/week: 0.0 standard drinks   Drug use: Not Currently    Review of Systems  Constitutional: Negative for fever. Eyes: Negative for visual changes. ENT: Negative for sore throat. Neck:  No neck pain  Cardiovascular: Negative for chest pain. Respiratory: + shortness of breath, wheezing, cough Gastrointestinal: Negative for abdominal pain, vomiting or diarrhea. Genitourinary: Negative for dysuria. Musculoskeletal: Negative for back pain. Skin: Negative for rash. Neurological: Negative for headaches, weakness or numbness. Psych: No SI or HI  ____________________________________________   PHYSICAL EXAM:  VITAL SIGNS: ED Triage Vitals  Enc Vitals Group     BP 06/12/21 1833 113/62     Pulse Rate 06/12/21 1833 70     Resp 06/12/21 1831 20     Temp 06/12/21 1831 97.6 F (36.4 C)     Temp Source 06/12/21 1831 Oral     SpO2 06/12/21 1833 93 %     Weight 06/12/21 1832 150 lb (68 kg)     Height 06/12/21 1832 5\' 8"  (1.727 m)     Head Circumference --      Peak Flow --  Pain Score 06/12/21 1831 0     Pain Loc --      Pain Edu? --      Excl. in [place]? --     Constitutional: Alert and oriented. Well appearing and in no apparent distress. HEENT:      Head: Normocephalic and atraumatic.         Eyes: Conjunctivae are normal. Sclera is non-icteric.       Mouth/Throat: Mucous membranes are moist.       Neck: Supple with no signs of meningismus. Cardiovascular: Regular rate and rhythm. No murmurs, gallops, or rubs. 2+ symmetrical distal pulses are present in all extremities. No JVD. Respiratory: Increased work of breathing, tachypneic, hypoxic on room air now on 2 L nasal cannula with wheezing bilaterally Gastrointestinal: Soft, non tender, and non distended with positive bowel sounds. No rebound or guarding. Genitourinary: No CVA tenderness. Musculoskeletal:  No edema, cyanosis, or erythema of extremities. Neurologic: Normal speech and language. Face is symmetric. Moving all extremities. No gross focal neurologic deficits are appreciated. Skin: Skin is warm, dry and intact. No rash noted. Psychiatric: Mood and affect are normal. Speech and behavior are  normal.  ____________________________________________   LABS (all labs ordered are listed, but only abnormal results are displayed)  Labs Reviewed  CBC - Abnormal; Notable for the following components:      Result Value   WBC 13.2 (*)    RBC 3.68 (*)    Hemoglobin 11.5 (*)    HCT 35.6 (*)    All other components within normal limits  BASIC METABOLIC PANEL - Abnormal; Notable for the following components:   Sodium 133 (*)    Glucose, Bld 102 (*)    BUN 30 (*)    Creatinine, Ser 2.25 (*)    GFR, Estimated 29 (*)    All other components within normal limits  RESP PANEL BY RT-PCR (FLU A&B, COVID) ARPGX2  PROCALCITONIN  TROPONIN I (HIGH SENSITIVITY)   ____________________________________________  EKG  ED ECG REPORT I, Rudene Re, the attending physician, personally viewed and interpreted this ECG.  Atrial paced rhythm with no concordant ST elevations or depressions. ____________________________________________  RADIOLOGY  I have personally reviewed the images performed during this visit and I agree with the Radiologist's read.   Interpretation by Radiologist:  DG Chest 2 View  Result Date: 06/12/2021 CLINICAL DATA:  Productive cough. EXAM: CHEST - 2 VIEW COMPARISON:  February 14, 2020 FINDINGS: There is a dual lead AICD with stable lead wire positioning. Very mild atelectatic changes are seen within the bilateral lung bases. There is no evidence of a pleural effusion or pneumothorax. The heart size and mediastinal contours are within normal limits. The visualized skeletal structures are unremarkable. IMPRESSION: Very mild bibasilar atelectasis. Electronically Signed   By: Virgina Norfolk M.D.   On: 06/12/2021 19:20   CT CHEST WO CONTRAST  Result Date: 06/13/2021 CLINICAL DATA:  Shortness of breath and cough. EXAM: CT CHEST WITHOUT CONTRAST TECHNIQUE: Multidetector CT imaging of the chest was performed following the standard protocol without IV contrast. COMPARISON:   None. FINDINGS: Cardiovascular: There is a multi lead AICD. There is marked severity calcification of the thoracic aorta, without evidence of aortic aneurysm. Normal heart size with marked severity coronary artery calcification. No pericardial effusion. Mediastinum/Nodes: Mild AP window, pretracheal and right hilar lymphadenopathy is seen. Thyroid gland, trachea, and esophagus demonstrate no significant findings. Lungs/Pleura: Mild areas of atelectasis and/or early infiltrate are seen along the anterior aspects of the bilateral  upper lobes and posterior aspect of the right lower lobe. Small atelectatic changes are also seen scattered throughout both lungs. 7 mm and 8 mm noncalcified posterolateral and posteromedial right lower lobe lung nodules are seen (axial CT images 106 and 136, CT series 3). An 8 mm sub solid lung nodule versus focal scar is noted within the left lower lobe (axial CT image 131, CT series 3). There is no evidence of a pleural effusion or pneumothorax. Upper Abdomen: There is a very small hiatal hernia. 3 mm, 7 mm and 9 mm foci of parenchymal low attenuation are noted within the right lobe of the liver. Small, similar appearing lesions are seen within the left lobe. Musculoskeletal: Multilevel degenerative changes are seen throughout the thoracic spine. IMPRESSION: 1. 7 mm and 8 mm noncalcified right lower lobe lung nodules with an 8 mm left lower lobe subsolid lung nodule versus focal scar. Non-contrast chest CT at 3-6 months is recommended. If the nodules are stable at time of repeat CT, then future CT at 18-24 months (from today's scan) is considered optional for low-risk patients, but is recommended for high-risk patients. This recommendation follows the consensus statement: Guidelines for Management of Incidental Pulmonary Nodules Detected on CT Images: From the Fleischner Society 2017; Radiology 2017; 284:228-243. 2. Mild bilateral upper lobe and right lower lobe atelectasis and/or early  infiltrate. 3. Findings likely consistent with multiple small hepatic cysts and/or hemangiomas. Correlation with nonemergent hepatic ultrasound is recommended. 4. Aortic atherosclerosis. Aortic Atherosclerosis (ICD10-I70.0). Electronically Signed   By: Virgina Norfolk M.D.   On: 06/13/2021 06:05     ____________________________________________   PROCEDURES  Procedure(s) performed:yes .1-3 Lead EKG Interpretation Performed by: Rudene Re, MD Authorized by: Rudene Re, MD     Interpretation: non-specific     ECG rate assessment: normal     Rhythm: paced     Ectopy: none     Conduction: abnormal     Critical Care performed: yes  CRITICAL CARE Performed by: Rudene Re  ?  Total critical care time: 35 min  Critical care time was exclusive of separately billable procedures and treating other patients.  Critical care was necessary to treat or prevent imminent or life-threatening deterioration.  Critical care was time spent personally by me on the following activities: development of treatment plan with patient and/or surrogate as well as nursing, discussions with consultants, evaluation of patient's response to treatment, examination of patient, obtaining history from patient or surrogate, ordering and performing treatments and interventions, ordering and review of laboratory studies, ordering and review of radiographic studies, pulse oximetry and re-evaluation of patient's condition.  ____________________________________________   INITIAL IMPRESSION / ASSESSMENT AND PLAN / ED COURSE  80 y.o. male history of CHF with a EF of 40%, AICD, Barrett's esophagitis, CAD on Plavix, hypertension, hyperlipidemia, paroxysmal atrial fibrillation, AAA without rupture who presents for evaluation of shortness of breath and cough x 5 days.  Patient hypoxic with increased work of breathing, wheezing bilaterally.  He has an emesis bag in his hand which is filled with thick green  purulent sputum.  Presentation concerning for pneumonia versus bronchitis.  PE was considered due to recent surgery but based on the clinical presentation of the productive cough I feel this is most likely infectious in etiology.  The chest x-ray does not show pneumonia therefore we will send patient for CT of the chest.  Unfortunately a CT angio cannot be done due to patient's kidney function.  EKG shows paced rhythm with no signs  of ischemia.  Troponin is pending.  COVID and flu negative.  He does have elevated white count of 13.2 but no fever or tachycardia.  Due to recent surgery and intubation will treat with cefepime and Vanco.  Will give DuoNeb's and Solu-Medrol.  Patient placed on telemetry for close monitoring of cardiorespiratory status.  Old medical records review including notes from patient's surgery  _________________________ 6:47 AM on 06/13/2021 ----------------------------------------- CT consistent with a couple of lung nodules the patient will need further imaging in 3 months.  These findings were discussed with patient.  Also early pneumonia seen on CT.  With a new oxygen requirement will get patient admitted to the hospitalist service     _____________________________________________ Please note:  Patient was evaluated in Emergency Department today for the symptoms described in the history of present illness. Patient was evaluated in the context of the global COVID-19 pandemic, which necessitated consideration that the patient might be at risk for infection with the SARS-CoV-2 virus that causes COVID-19. Institutional protocols and algorithms that pertain to the evaluation of patients at risk for COVID-19 are in a state of rapid change based on information released by regulatory bodies including the CDC and federal and state organizations. These policies and algorithms were followed during the patient's care in the ED.  Some ED evaluations and interventions may be delayed as a result  of limited staffing during the pandemic.   [place] Controlled Substance Database was reviewed by me. ____________________________________________   FINAL CLINICAL IMPRESSION(S) / ED DIAGNOSES   Final diagnoses:  Acute respiratory failure with hypoxia ([place])  HCAP (healthcare-associated pneumonia)      NEW MEDICATIONS STARTED DURING THIS VISIT:  ED Discharge Orders     None        Note:  This document was prepared using Dragon voice recognition software and may include unintentional dictation errors.    Rudene Re, MD 06/13/21 [phone]

## 2021-06-13 NOTE — ED Notes (Signed)
Pt noted to be sleeping soundly.  Pt woke long enough to verify lab draw and went back to sleep.   Pt reports he hasn't slept well the last 2 nights.

## 2021-06-13 NOTE — ED Notes (Signed)
Pt noted to be sleeping soundly.  NAD noted.    This Probation officer and NS attempted to call Dietary regarding new diet order w/o answer.  Will follow-up.

## 2021-06-13 NOTE — Consult Note (Signed)
Pharmacy Antibiotic Note  Matthew Zamora is a 80 y.o. male admitted on 06/13/2021 with  pneumonia . Pharmacy has been consulted for Vancomycin and cefepime dosing.  Plan: Cefepime 2gm q 24hrs  Vancomycin 1gm given at 0650, then Vancomycin 1250 mg IV Q 48 hrs (1st dose 12/30) Goal AUC 400-550. Expected AUC: 497 SCr used: 2.25   Height: 5\' 8"  (172.7 cm) Weight: 68 kg (150 lb) IBW/kg (Calculated) : 68.4  Temp (24hrs), Avg:97.9 F (36.6 C), Min:97.6 F (36.4 C), Max:98.2 F (36.8 C)  Recent Labs  Lab 06/12/21 1841  WBC 13.2*  CREATININE 2.25*    Estimated Creatinine Clearance: 25.6 mL/min (A) (by C-G formula based on SCr of 2.25 mg/dL (H)).    No Known Allergies  Antimicrobials this admission: 12/28 Cefepime >>  12/28 Vancomycin >>   Dose adjustments this admission: none  Microbiology results: 12/28 Strep pnuemo antigen : pending 12/28 Legionella ur Ag: pending 12/28 MRSA PCR: ordered  Thank you for allowing pharmacy to be a part of this patients care.  Charlean Carneal Rodriguez-Guzman PharmD, BCPS 06/13/2021 8:40 AM

## 2021-06-13 NOTE — ED Notes (Signed)
Patient transported to CT 

## 2021-06-13 NOTE — Progress Notes (Signed)
*  PRELIMINARY RESULTS* Echocardiogram 2D Echocardiogram has been performed.  Sherrie Sport 06/13/2021, 2:12 PM

## 2021-06-13 NOTE — ED Notes (Signed)
Pt noted to have a congested cough, but has not had any production of mucous.

## 2021-06-13 NOTE — H&P (Signed)
History and Physical    Matthew Zamora NWG:[id number] DOB: 29-Apr-1941 DOA: 06/13/2021  Referring MD/NP/PA:   PCP: [place] Pink, MD   Patient coming from:  The patient is coming from home.  At baseline, pt is independent for most of ADL.        Chief Complaint: SOB and cough  HPI: Matthew Zamora is a 80 y.o. male with medical history significant of sCHF with EF 25-30%, hypertension, hyperlipidemia, GERD, AAA, atrial fibrillation on Eliquis, CAD, CKD stage IIIb, prostate cancer, AICD placement, who presents with shortness breath and cough.  Patient states that he has shortness breath for more than 3 days, which has been progressively worsening.  He has cough with yellow sputum production.  He has chest pressure, but no active chest pain.  No fever or chills.  Patient does not have nausea, vomiting, diarrhea or abdominal pain.  No symptoms of UTI.  Patient states that he quit smoking 20 years ago.  He had smoked for more than 40 years before quitting. Of note, pt recently had surgery for broken  left ankle 2 months ago for which she was intubated. He is still wearing boot on left ankle. No ankle pain.    ED Course: pt was found to have BNP 337, WBC 13.2, troponin level 37, 29, negative COVID PCR, slightly worsening renal function, temperature normal, blood pressure 145/61, heart rate 85, RR 20, oxygen saturation 90% on room air, which improved to 93-100% on 2 L oxygen (patient is not wearing oxygen normally at home).  Chest x-ray showed mild bilateral basilar atelectasis.  Patient is admitted to progressive bed as inpatient.  CT of chest without contrast showed: 1. 7 mm and 8 mm noncalcified right lower lobe lung nodules with an 8 mm left lower lobe subsolid lung nodule versus focal scar. Non-contrast chest CT at 3-6 months is recommended. If the nodules are stable at time of repeat CT, then future CT at 18-24 months (from today's scan) is considered optional for low-risk  patients, but is recommended for high-risk patients. This recommendation follows the consensus statement: Guidelines for Management of Incidental Pulmonary Nodules Detected on CT Images: From the Fleischner Society 2017; Radiology 2017; 284:228-243. 2. Mild bilateral upper lobe and right lower lobe atelectasis and/or early infiltrate. 3. Findings likely consistent with multiple small hepatic cysts and/or hemangiomas. Correlation with nonemergent hepatic ultrasound is recommended. 4. Aortic atherosclerosis.    Review of Systems:   General: no fevers, chills, no body weight gain,  has fatigue HEENT: no blurry vision, hearing changes or sore throat Respiratory: has dyspnea, coughing, wheezing CV: has chest pressure, no palpitations GI: no nausea, vomiting, abdominal pain, diarrhea, constipation GU: no dysuria, burning on urination, increased urinary frequency, hematuria  Ext: has leg edema Neuro: no unilateral weakness, numbness, or tingling, no vision change or hearing loss Skin: no rash, no skin tear. MSK: No muscle spasm, no deformity, no limitation of range of movement in spin Heme: No easy bruising.  Travel history: No recent long distant travel.  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/15   PAF (paroxysmal atrial fibrillation) ([place])    Prostate CA [facility])     Past Surgical History:  Procedure Laterality Date   CATARACT EXTRACTION     COLONOSCOPY  10/06/2014, 09/18/2004, 03/14/2009   ESOPHAGOGASTRODUODENOSCOPY   10/06/2014, 03/18/2001, 03/14/2009   ESOPHAGOGASTRODUODENOSCOPY (EGD) WITH PROPOFOL N/A 05/07/2018   Procedure: ESOPHAGOGASTRODUODENOSCOPY (EGD) WITH PROPOFOL;  Surgeon: Toledo, [name], MD;  Location: [facility] ENDOSCOPY;  Service: Gastroenterology;  Laterality: N/A;   FLEXIBLE SIGMOIDOSCOPY  08/26/1990   INSERTION OF ICD     PROSTATE SURGERY     TONSILLECTOMY      Social History:  reports that he quit smoking about 22 years ago. His smoking use included cigarettes. He has a 38.00 pack-year smoking history. He has never used smokeless tobacco. He reports that he does not currently use drugs. He reports that he does not drink alcohol.  Family History:  Family History  Problem Relation Age of Onset   Heart attack Mother    Heart attack Father      Prior to Admission medications   Medication Sig Start Date End Date Taking? Authorizing Provider  acetaminophen (TYLENOL) 325 MG tablet Take 2 tablets (650 mg total) by mouth every 4 (four) hours as needed for headache or mild pain. 10/29/19   Bradly Bienenstock, NP  albuterol (PROVENTIL) (2.5 MG/3ML) 0.083% nebulizer solution Inhale 3 mLs (2.5 mg total) into the lungs as needed for shortness of breath. 10/29/19   Bradly Bienenstock, NP  amiodarone (PACERONE) 400 MG tablet Take 1 tablet (400 mg total) by mouth daily. 12/16/19   Pokhrel, Corrie Mckusick, MD  clopidogrel (PLAVIX) 75 MG tablet Take 1 tablet (75 mg total) by mouth daily. 10/29/19   Bradly Bienenstock, NP  Fluticasone-Salmeterol (ADVAIR) 500-50 MCG/DOSE AEPB Inhale 1 puff into the lungs 2 (two) times daily. 12/09/19   [provider]  gabapentin (NEURONTIN) 100 MG capsule Take 100 mg by mouth at bedtime. 12/09/19   [provider]  HYDROcodone-acetaminophen (NORCO/VICODIN) 5-325 MG tablet Take 1 tablet by mouth every 6 (six) hours as needed for severe pain. 01/17/20   Lavonia Drafts, MD  ipratropium-albuterol (DUONEB) 0.5-2.5 (3) MG/3ML SOLN Take 3 mLs by nebulization 3 (three) times daily.  10/29/19   Bradly Bienenstock, NP  magnesium chloride (SLOW-MAG) 64 MG TBEC SR tablet Take 1 tablet (64 mg total) by mouth daily. 10/29/19   Bradly Bienenstock, NP  melatonin 5 MG TABS Take 0.5 tablets (2.5 mg total) by mouth at bedtime as needed (sleep). 10/29/19   Bradly Bienenstock, NP  metoprolol succinate (TOPROL-XL) 100 MG 24 hr tablet Take 100 mg by mouth daily. 12/03/19   [provider]  mexiletine (MEXITIL) 150 MG capsule Take 150 mg by mouth every 8 (eight) hours. 12/01/19   [provider]  Multiple Vitamin (MULTIVITAMIN WITH MINERALS) TABS tablet Take 1 tablet by mouth daily. 10/29/19   Bradly Bienenstock, NP  pantoprazole (PROTONIX) 40 MG tablet Take 1 tablet (40 mg total) by mouth daily. 10/29/19   Darel Hong D, NP  spironolactone (ALDACTONE) 25 MG tablet Take 12.5 mg by mouth daily at 6 (six) AM. 12/09/19   [provider]  tamsulosin (FLOMAX) 0.4 MG CAPS capsule Take 0.4 mg by mouth daily. 12/09/19   [provider]    Physical Exam: Vitals:   06/13/21 1330 06/13/21 1400 06/13/21 1430 06/13/21 1730  BP: [name])  142/76 (!) 114/54 (!) 130/57 (!) 146/110  Pulse: 73 67 73 87  Resp: (!) 22 15 17 19   Temp:      TempSrc:      SpO2: 98% 99% 100% 98%  Weight:      Height:       General: Not in acute distress HEENT:       Eyes: PERRL, EOMI, no scleral icterus.       ENT: No discharge from the ears and nose, no pharynx injection, no tonsillar enlargement.        Neck: Has positive JVD, no bruit, no mass felt. Heme: No neck lymph node enlargement. Cardiac: S1/S2, RRR, No murmurs, No gallops or rubs. Respiratory: Has wheezing bilaterally GI: Soft, nondistended, nontender, no rebound pain, no organomegaly, BS present. GU: No hematuria Ext: Has 1+ pitting leg edema bilaterally. 1+DP/PT pulse bilaterally. Musculoskeletal: No joint deformities, No joint redness or warmth, no limitation of ROM in spin. Has boot on left ankle Skin: No rashes.  Neuro: Alert,  oriented X3, cranial nerves II-XII grossly intact, moves all extremities normally.  Psych: Patient is not psychotic, no suicidal or hemocidal ideation.  Labs on Admission: I have personally reviewed following labs and imaging studies  CBC: Recent Labs  Lab 06/12/21 1841  WBC 13.2*  HGB 11.5*  HCT 35.6*  MCV 96.7  PLT 545   Basic Metabolic Panel: Recent Labs  Lab 06/12/21 1841  NA 133*  K 4.1  CL 100  CO2 24  GLUCOSE 102*  BUN 30*  CREATININE 2.25*  CALCIUM 9.5   GFR: Estimated Creatinine Clearance: 25.6 mL/min (A) (by C-G formula based on SCr of 2.25 mg/dL (H)). Liver Function Tests: No results for input(s): AST, ALT, ALKPHOS, BILITOT, PROT, ALBUMIN in the last 168 hours. No results for input(s): LIPASE, AMYLASE in the last 168 hours. No results for input(s): AMMONIA in the last 168 hours. Coagulation Profile: No results for input(s): INR, PROTIME in the last 168 hours. Cardiac Enzymes: No results for input(s): CKTOTAL, CKMB, CKMBINDEX, TROPONINI in the last 168 hours. BNP (last 3 results) No results for input(s): PROBNP in the last 8760 hours. HbA1C: Recent Labs    06/12/21 1800  HGBA1C 5.1   CBG: No results for input(s): GLUCAP in the last 168 hours. Lipid Profile: No results for input(s): CHOL, HDL, LDLCALC, TRIG, CHOLHDL, LDLDIRECT in the last 72 hours. Thyroid Function Tests: No results for input(s): TSH, T4TOTAL, FREET4, T3FREE, THYROIDAB in the last 72 hours. Anemia Panel: No results for input(s): VITAMINB12, FOLATE, FERRITIN, TIBC, IRON, RETICCTPCT in the last 72 hours. Urine analysis:    Component Value Date/Time   COLORURINE YELLOW (A) 09/17/2018 0833   APPEARANCEUR HAZY (A) 09/17/2018 0833   LABSPEC 1.020 09/17/2018 0833   PHURINE 5.0 09/17/2018 0833   GLUCOSEU NEGATIVE 09/17/2018 0833   HGBUR NEGATIVE 09/17/2018 0833   BILIRUBINUR NEGATIVE 09/17/2018 0833   KETONESUR NEGATIVE 09/17/2018 0833   PROTEINUR NEGATIVE 09/17/2018 0833   NITRITE  NEGATIVE 09/17/2018 0833   LEUKOCYTESUR SMALL (A) 09/17/2018 0833   Sepsis Labs: @LABRCNTIP (procalcitonin:4,lacticidven:4) ) Recent Results (from the past 240 hour(s))  Resp Panel by RT-PCR (Flu A&B, Covid) Nasopharyngeal Swab     Status: None   Collection Time: 06/13/21  2:33 AM   Specimen: Nasopharyngeal Swab; Nasopharyngeal(NP) swabs in vial transport medium  Result Value Ref Range Status   SARS Coronavirus 2 by RT PCR NEGATIVE NEGATIVE Final    Comment: (NOTE) SARS-CoV-2 target nucleic acids are NOT DETECTED.  The SARS-CoV-2 RNA is generally detectable in upper respiratory specimens during the acute phase of infection. The lowest concentration of SARS-CoV-2 viral copies this assay can detect is 138 copies/mL. A negative result does not preclude SARS-Cov-2 infection and should not be used as the sole basis for treatment or other patient management decisions. A negative result may occur with  improper specimen collection/handling, submission of specimen other than nasopharyngeal swab, presence of viral mutation(s) within the areas targeted by this assay, and inadequate number of viral copies(<138 copies/mL). A negative result must be combined with clinical observations, patient history, and epidemiological information. The expected result is Negative.  Fact Sheet for Patients:  EntrepreneurPulse.com.au  Fact Sheet for Healthcare Providers:  IncredibleEmployment.be  This test is no t yet approved or cleared by the [place] FDA and  has been authorized for detection and/or diagnosis of SARS-CoV-2 by FDA under an Emergency Use Authorization (EUA). This EUA will remain  in effect (meaning this test can be used) for the duration of the COVID-19 declaration under Section 564(b)(1) of the Act, 21 U.S.C.section 360bbb-3(b)(1), unless the authorization is terminated  or revoked sooner.       Influenza A by PCR NEGATIVE NEGATIVE Final    Influenza B by PCR NEGATIVE NEGATIVE Final    Comment: (NOTE) The Xpert Xpress SARS-CoV-2/FLU/RSV plus assay is intended as an aid in the diagnosis of influenza from Nasopharyngeal swab specimens and should not be used as a sole basis for treatment. Nasal washings and aspirates are unacceptable for Xpert Xpress SARS-CoV-2/FLU/RSV testing.  Fact Sheet for Patients: EntrepreneurPulse.com.au  Fact Sheet for Healthcare Providers: IncredibleEmployment.be  This test is not yet approved or cleared by the [place] FDA and has been authorized for detection and/or diagnosis of SARS-CoV-2 by FDA under an Emergency Use Authorization (EUA). This EUA will remain in effect (meaning this test can be used) for the duration of the COVID-19 declaration under Section 564(b)(1) of the Act, 21 U.S.C. section 360bbb-3(b)(1), unless the authorization is terminated or revoked.  Performed at [facility], [address], [place], [place] [address]      Radiological Exams on Admission: DG Chest 2 View  Result Date: 06/12/2021 CLINICAL DATA:  Productive cough. EXAM: CHEST - 2 VIEW COMPARISON:  February 14, 2020 FINDINGS: There is a dual lead AICD with stable lead wire positioning. Very mild atelectatic changes are seen within the bilateral lung bases. There is no evidence of a pleural effusion or pneumothorax. The heart size and mediastinal contours are within normal limits. The visualized skeletal structures are unremarkable. IMPRESSION: Very mild bibasilar atelectasis. Electronically Signed   By: Virgina Norfolk M.D.   On: 06/12/2021 19:20   CT CHEST WO CONTRAST  Result Date: 06/13/2021 CLINICAL DATA:  Shortness of breath and cough. EXAM: CT CHEST WITHOUT CONTRAST TECHNIQUE: Multidetector CT imaging of the chest was performed following the standard protocol without IV contrast. COMPARISON:  None. FINDINGS: Cardiovascular: There is a multi lead AICD. There  is marked severity calcification of the thoracic aorta, without evidence of aortic aneurysm. Normal heart size with marked severity coronary artery calcification. No pericardial effusion. Mediastinum/Nodes: Mild AP window, pretracheal and right hilar lymphadenopathy is seen. Thyroid gland, trachea, and esophagus demonstrate no significant findings. Lungs/Pleura: Mild areas of atelectasis and/or early infiltrate are seen along the anterior aspects of the bilateral upper lobes and posterior aspect of the right lower lobe. Small atelectatic changes are also seen scattered throughout both lungs. 7 mm and 8 mm noncalcified posterolateral  and posteromedial right lower lobe lung nodules are seen (axial CT images 106 and 136, CT series 3). An 8 mm sub solid lung nodule versus focal scar is noted within the left lower lobe (axial CT image 131, CT series 3). There is no evidence of a pleural effusion or pneumothorax. Upper Abdomen: There is a very small hiatal hernia. 3 mm, 7 mm and 9 mm foci of parenchymal low attenuation are noted within the right lobe of the liver. Small, similar appearing lesions are seen within the left lobe. Musculoskeletal: Multilevel degenerative changes are seen throughout the thoracic spine. IMPRESSION: 1. 7 mm and 8 mm noncalcified right lower lobe lung nodules with an 8 mm left lower lobe subsolid lung nodule versus focal scar. Non-contrast chest CT at 3-6 months is recommended. If the nodules are stable at time of repeat CT, then future CT at 18-24 months (from today's scan) is considered optional for low-risk patients, but is recommended for high-risk patients. This recommendation follows the consensus statement: Guidelines for Management of Incidental Pulmonary Nodules Detected on CT Images: From the Fleischner Society 2017; Radiology 2017; 284:228-243. 2. Mild bilateral upper lobe and right lower lobe atelectasis and/or early infiltrate. 3. Findings likely consistent with multiple small  hepatic cysts and/or hemangiomas. Correlation with nonemergent hepatic ultrasound is recommended. 4. Aortic atherosclerosis. Aortic Atherosclerosis (ICD10-I70.0). Electronically Signed   By: Virgina Norfolk M.D.   On: 06/13/2021 06:05   ECHOCARDIOGRAM COMPLETE  Result Date: 06/13/2021    ECHOCARDIOGRAM REPORT   Patient Name:   Matthew Zamora Date of Exam: 06/13/2021 Medical Rec #:  [id number]           Height:       68.0 in Accession #:    [id number]          Weight:       150.0 lb Date of Birth:  1941-05-22           BSA:          1.809 m Patient Age:    76 years            BP:           142/76 mmHg Patient Gender: M                   HR:           73 bpm. Exam Location:  [facility] Procedure: 2D Echo, Cardiac Doppler and Color Doppler Indications:     CHF-acute systolic U93.23  History:         Patient has prior history of Echocardiogram examinations, most                  recent 09/17/2019. CHF; CAD.  Sonographer:     Sherrie Sport Referring Phys:  [id number] Soledad Gerlach Demarian Epps Diagnosing Phys: Serafina Royals MD  Sonographer Comments: Suboptimal parasternal window and suboptimal apical window. IMPRESSIONS  1. Left ventricular ejection fraction, by estimation, is 40 to 45%. The left ventricle has mildly decreased function. The left ventricle demonstrates global hypokinesis. The left ventricular internal cavity size was mildly dilated. Left ventricular diastolic parameters are consistent with Grade I diastolic dysfunction (impaired relaxation).  2. Right ventricular systolic function is normal. The right ventricular size is normal.  3. The mitral valve is normal in structure. Mild mitral valve regurgitation.  4. The aortic valve is normal in structure. Aortic valve regurgitation is not visualized. FINDINGS  Left Ventricle: Left ventricular ejection fraction, by estimation, is 40  to 45%. The left ventricle has mildly decreased function. The left ventricle demonstrates global hypokinesis. The left ventricular internal cavity size  was mildly dilated. There is  no left ventricular hypertrophy. Left ventricular diastolic parameters are consistent with Grade I diastolic dysfunction (impaired relaxation). Right Ventricle: The right ventricular size is normal. No increase in right ventricular wall thickness. Right ventricular systolic function is normal. Left Atrium: Left atrial size was normal in size. Right Atrium: Right atrial size was normal in size. Pericardium: There is no evidence of pericardial effusion. Mitral Valve: The mitral valve is normal in structure. Mild mitral valve regurgitation. MV peak gradient, 5.4 mmHg. The mean mitral valve gradient is 2.0 mmHg. Tricuspid Valve: The tricuspid valve is normal in structure. Tricuspid valve regurgitation is mild. Aortic Valve: The aortic valve is normal in structure. Aortic valve regurgitation is not visualized. Aortic valve mean gradient measures 1.5 mmHg. Aortic valve peak gradient measures 2.9 mmHg. Aortic valve area, by VTI measures 2.90 cm. Pulmonic Valve: The pulmonic valve was normal in structure. Pulmonic valve regurgitation is not visualized. Aorta: The aortic root and ascending aorta are structurally normal, with no evidence of dilitation. IAS/Shunts: No atrial level shunt detected by color flow Doppler.  LEFT VENTRICLE PLAX 2D LVIDd:         4.40 cm   Diastology LVIDs:         3.20 cm   LV e' medial:    4.24 cm/s LV PW:         1.40 cm   LV E/e' medial:  25.9 LV IVS:        0.95 cm   LV e' lateral:   7.51 cm/s LVOT diam:     2.00 cm   LV E/e' lateral: 14.6 LV SV:         49 LV SV Index:   27 LVOT Area:     3.14 cm  RIGHT VENTRICLE RV Basal diam:  3.90 cm RV S prime:     14.90 cm/s TAPSE (M-mode): 3.0 cm LEFT ATRIUM              Index        RIGHT ATRIUM           Index LA diam:        4.30 cm  2.38 cm/m   RA Area:     19.10 cm LA Vol (A2C):   132.0 ml 72.99 ml/m  RA Volume:   57.40 ml  31.74 ml/m LA Vol (A4C):   61.5 ml  34.01 ml/m LA Biplane Vol: 90.7 ml  50.15 ml/m  AORTIC  VALVE                    PULMONIC VALVE AV Area (Vmax):    2.62 cm     PV Vmax:        0.74 m/s AV Area (Vmean):   2.63 cm     PV Vmean:       50.900 cm/s AV Area (VTI):     2.90 cm     PV VTI:         0.148 m AV Vmax:           85.35 cm/s   PV Peak grad:   2.2 mmHg AV Vmean:          56.200 cm/s  PV Mean grad:   1.0 mmHg AV VTI:            0.170 m  RVOT Peak grad: 2 mmHg AV Peak Grad:      2.9 mmHg AV Mean Grad:      1.5 mmHg LVOT Vmax:         71.10 cm/s LVOT Vmean:        47.000 cm/s LVOT VTI:          0.157 m LVOT/AV VTI ratio: 0.92 MITRAL VALVE                TRICUSPID VALVE MV Area (PHT): 4.86 cm     TR Peak grad:   11.8 mmHg MV Area VTI:   1.57 cm     TR Vmax:        172.00 cm/s MV Peak grad:  5.4 mmHg MV Mean grad:  2.0 mmHg     SHUNTS MV Vmax:       1.16 m/s     Systemic VTI:  0.16 m MV Vmean:      69.4 cm/s    Systemic Diam: 2.00 cm MV Decel Time: 156 msec     Pulmonic VTI:  0.125 m MV E velocity: 110.00 cm/s MV A velocity: 48.80 cm/s MV E/A ratio:  2.25 Serafina Royals MD Electronically signed by Serafina Royals MD Signature Date/Time: 06/13/2021/4:58:08 PM    Final      EKG: I have personally reviewed.  Paced rhythm, QTC 460   Assessment/Plan Principal Problem:   Acute on chronic systolic CHF (congestive heart failure) ([name]) Active Problems:   Paroxysmal atrial fibrillation ([name])   Essential hypertension   Hyperlipidemia   CAD (coronary artery disease)   Elevated troponin   CKD (chronic kidney disease), stage IIIb   Lung nodule   Liver cyst   COPD exacerbation ([name])   Acute on chronic systolic CHF (congestive heart failure) ([place]): 2D echo on 09/17/2019 showed EF of 25-30%.  Patient has elevated BNP 337, positive JVD, 1+ leg edema, clinically consistent with CHF exacerbation.  -Will admit to progressive unit as inpatient -Lasix 40 mg bid by IV -2d echo -Daily weights -strict I/O's -Low salt diet -Fluid restriction -Obtain REDs Vest reading  Possible COPD exacerbation:  Patient has wheezing on auscultation.  Patient was a heavy smoker for 40 years. He may have undiagnosed COPD, now with acute exacerbation.  Initially suspected HCAP due to leukocytosis with WBC 13.2 and questionable infiltration on CT scan of chest, but the patient does not have fever, procalcitonin < 0.10.  Patient is not septic. Patient received 1 dose of vancomycin and cefepime.  Will de-escalated to Z-Pak. -Bronchodilators -Solu-Medrol 40 mg IV bid -Z pak  -Mucinex for cough  -Incentive spirometry -Urine S. pneumococcal  and Legionella antigen -Follow up blood culture x2 -Nasal cannula oxygen as needed to maintain O2 saturation 93% or greater  Paroxysmal atrial fibrillation ([name]) -Amiodarone, Mexitil, metoprolol -Eliquis  Essential hypertension -IV hydralazine as needed -Metoprolol -Patient is on IV Lasix  Hyperlipidemia:   -Lipitor  CAD (coronary artery disease) and elevated troponin: Troponin 37, 29.  Most likely due to demand ischemia -Aspirin, Lipitor -Trend troponin -Check A1c, FLP  CKD (chronic kidney disease), stage IIIb: Slightly worsening than baseline.  Baseline creatinine 1.7-2.0 recently.  His creatinine is 2.25, BUN 30.  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  He want to be full code.  His son is aware of his decision today) Family Communication: Yes, patient's son by phone Disposition Plan:  Anticipate discharge back to previous environment Consults called:  none Admission status and Level of care: Progressive:  as inpt        Status is: Inpatient  Remains inpatient appropriate because: Patient has multiple comorbidities, including severe CHF with EF of  20-30%, now presents with shortness of breath due to CHF exacerbation and possible HCAP.  Undiagnosed COPD with acute exacerbation is also possible.  He has elevated troponin.  His presentation is highly complicated.  Patient is at high risk of deteriorating.  Need to be treated in hospital for at least 2 days          Date of Service 06/13/2021    [facility]   If 7PM-7AM, please contact night-coverage [email or web address] 06/13/2021, 6:01 PM

## 2021-06-14 ENCOUNTER — Encounter: Payer: Self-pay | Admitting: Internal Medicine

## 2021-06-14 ENCOUNTER — Inpatient Hospital Stay: Payer: Medicare Other

## 2021-06-14 DIAGNOSIS — I2510 Atherosclerotic heart disease of native coronary artery without angina pectoris: Secondary | ICD-10-CM

## 2021-06-14 DIAGNOSIS — N183 Chronic kidney disease, stage 3 unspecified: Secondary | ICD-10-CM

## 2021-06-14 DIAGNOSIS — E7849 Other hyperlipidemia: Secondary | ICD-10-CM

## 2021-06-14 DIAGNOSIS — J9601 Acute respiratory failure with hypoxia: Secondary | ICD-10-CM

## 2021-06-14 LAB — BASIC METABOLIC PANEL
Anion gap: 9 (ref 5–15)
BUN: 40 mg/dL — ABNORMAL HIGH (ref 8–23)
CO2: 25 mmol/L (ref 22–32)
Calcium: 9.5 mg/dL (ref 8.9–10.3)
Chloride: 101 mmol/L (ref 98–111)
Creatinine, Ser: 2.35 mg/dL — ABNORMAL HIGH (ref 0.61–1.24)
GFR, Estimated: 27 mL/min — ABNORMAL LOW (ref >60)
Glucose, Bld: 176 mg/dL — ABNORMAL HIGH (ref 70–99)
Potassium: 4.2 mmol/L (ref 3.5–5.1)
Sodium: 135 mmol/L (ref 135–145)

## 2021-06-14 LAB — LIPID PANEL
Cholesterol: 125 mg/dL (ref 0–200)
HDL: 45 mg/dL (ref >40)
LDL Cholesterol: 71 mg/dL (ref 0–99)
Total CHOL/HDL Ratio: 2.8 RATIO
Triglycerides: 45 mg/dL (ref <150)
VLDL: 9 mg/dL (ref 0–40)

## 2021-06-14 LAB — MAGNESIUM: Magnesium: 2.1 mg/dL (ref 1.7–2.4)

## 2021-06-14 MED ORDER — FUROSEMIDE 10 MG/ML IJ SOLN: 40.0000 mg | Freq: Every day | INTRAMUSCULAR | Status: DC

## 2021-06-14 MED ORDER — METHYLPREDNISOLONE SODIUM SUCC 40 MG IJ SOLR
40.0000 mg | Freq: Every day | INTRAMUSCULAR | Status: DC
  Administered 2021-06-14 – 2021-06-15 (×2): 40 mg via INTRAVENOUS
  Filled 2021-06-14 (×2): qty 1

## 2021-06-14 MED ORDER — IPRATROPIUM-ALBUTEROL 0.5-2.5 (3) MG/3ML IN SOLN
3.0000 mL | Freq: Three times a day (TID) | RESPIRATORY_TRACT | Status: DC
  Administered 2021-06-14 – 2021-06-15 (×2): 3 mL via RESPIRATORY_TRACT
  Filled 2021-06-14 (×2): qty 3

## 2021-06-14 NOTE — Progress Notes (Signed)
Pt scored a 4 on the RT protocol assessment. He is clear without wheezes. The nebulizer treatments are to change to TID.

## 2021-06-14 NOTE — Plan of Care (Signed)
  Problem: Clinical Measurements: Goal: Ability to maintain clinical measurements within normal limits will improve Outcome: Progressing   

## 2021-06-14 NOTE — Progress Notes (Signed)
PROGRESS NOTE  Matthew Zamora    DOB: 09-09-1940, 80 y.o.  ONG:[id number]  PCP: Maryland Pink, MD   Code Status: Full Code   DOA: 06/13/2021   LOS: 1  Brief Narrative of Current Hospitalization  Matthew Zamora is a 80 y.o. male with a PMH significant for HFrEF, HTN, HLD, GERD, AAA, Afib on eliquis, CAD, CKDIIIb, prostate cancer, AICD, recent left ankle surgery for fracture. They presented from home to the ED on 06/13/2021 with SOB x3 days. In the ED, it was found that they had likely CHF exacerbation and COPD exacerbation. They were treated with IV diuretics as well as steroids, breathing treatments and antibiotics.  Patient was admitted to medicine service for further workup and management of acute respiratory distress with hypoxia as outlined in detail below.  06/14/21 -stable on 2L  Assessment & Plan  Principal Problem:   Acute on chronic systolic CHF (congestive heart failure) ([name]) Active Problems:   Paroxysmal atrial fibrillation ([name])   Essential hypertension   Hyperlipidemia   CAD (coronary artery disease)   Elevated troponin   CKD (chronic kidney disease), stage IIIb   Lung nodule   Liver cyst   COPD exacerbation ([name])  Acute hypoxic respiratory failure- stable on 2L O2 at rest.  COPD exacerbation- s/p vanc and cefepime in ED, followed by azithromycin. Mild scattered wheezing on exam today. Leaning towards COPD as primary diagnosis given physical exam.  -Bronchodilators -continue steroids -Z pak  -Mucinex for cough  -Incentive spirometry -Urine S. pneumococcal  and Legionella antigen -Follow up sputum, blood culture x2 -Nasal cannula oxygen as needed to maintain O2 saturation 88% or greater  Acute on chronic systolic CHF (congestive heart failure) ([place]): appears euvolemic on exam. Echo shows EF 40-45% which is improved from previous. - daily weights - strict I/O - continue gentle IV diuresis - wean to room air as tolerated - sodium restriction  L  ankle fracture s/p fixation- surgery about 2 months ago, per patient. States that he has been wearing brace consistently in that time and ambulating but having pain and concern that his hardware is protruding from his ankle.  - L ankle xray - PT/OT   Paroxysmal atrial fibrillation ([name]) -Amiodarone, Mexitil, metoprolol -Eliquis   Essential hypertension -Metoprolol   Hyperlipidemia:          -Lipitor   CAD (coronary artery disease) and elevated troponin: Troponin 37, 29.  Most likely due to demand ischemia -Aspirin, Lipitor   CKD (chronic kidney disease), stage IIIb: Slightly worsening than baseline.  Baseline creatinine 1.7-2.0 recently. Maybe due to cardiorenal syndrome and worsening with diuresis.  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 Yes; Fluid consistency: Thin  Diet effective now       Question Answer Comment  Room service appropriate? Yes   Fluid consistency: Thin      06/13/21 0822            Subjective 06/14/21    Pt reports feeling improved clinically from respiratory stand point. He has no complaints at rest. He has concerns about the pain in his left ankle where he had recent surgery and  feels that metal is poking out of his skin.  Disposition Plan & Communication  Patient status: Inpatient  Admitted From: Home Disposition: Home Anticipated discharge date: TBD  Family Communication: none  Consults, Procedures, Significant Events  Consultants:  none  Procedures/significant events:  None  Antimicrobials:  Anti-infectives (From admission, onward)    Start     Dose/Rate Route Frequency Ordered Stop   06/15/21 0600  vancomycin (VANCOREADY) IVPB  1250 mg/250 mL  Status:  Discontinued        1,250 mg 166.7 mL/hr over 90 Minutes Intravenous Every 48 hours 06/13/21 0843 06/13/21 1759   06/14/21 1800  azithromycin (ZITHROMAX) tablet 250 mg       See Hyperspace for full Linked Orders Report.   250 mg Oral Daily 06/13/21 1800 06/18/21 1759   06/14/21 0500  ceFEPIme (MAXIPIME) 2 g in sodium chloride 0.9 % 100 mL IVPB  Status:  Discontinued        2 g 200 mL/hr over 30 Minutes Intravenous Every 24 hours 06/13/21 0843 06/13/21 1759   06/13/21 1815  azithromycin (ZITHROMAX) tablet 500 mg       See Hyperspace for full Linked Orders Report.   500 mg Oral Daily 06/13/21 1800 06/13/21 1806   06/13/21 0545  cefTRIAXone (ROCEPHIN) 1 g in sodium chloride 0.9 % 100 mL IVPB  Status:  Discontinued        1 g 200 mL/hr over 30 Minutes Intravenous  Once 06/13/21 0534 06/13/21 0535   06/13/21 0545  azithromycin (ZITHROMAX) tablet 500 mg  Status:  Discontinued        500 mg Oral  Once 06/13/21 0534 06/13/21 0535   06/13/21 0545  ceFEPIme (MAXIPIME) 1 g in sodium chloride 0.9 % 100 mL IVPB        1 g 200 mL/hr over 30 Minutes Intravenous  Once 06/13/21 0535 06/13/21 0640   06/13/21 0545  vancomycin (VANCOCIN) IVPB 1000 mg/200 mL premix        1,000 mg 200 mL/hr over 60 Minutes Intravenous  Once 06/13/21 0535 06/13/21 0812       Objective   Vitals:   06/13/21 2100 06/13/21 2200 06/13/21 2242 06/14/21 0504  BP: (!) 107/52 (!) 110/55 (!) 111/58 117/60  Pulse: 67 72 66 63  Resp: 18 14 16 16   Temp:  97.8 F (36.6 C) 97.7 F (36.5 C) 97.6 F (36.4 C)  TempSrc:  Oral Oral Oral  SpO2: 100% 99% 99% 100%  Weight:      Height:        Intake/Output Summary (Last 24 hours) at 06/14/2021 0729 Last data filed at 06/14/2021 0507 Gross per 24 hour  Intake --  Output 400 ml  Net -400 ml   Filed Weights   06/12/21 1832  Weight: 68 kg    Patient BMI: Body mass index is 22.81 kg/m.   Physical Exam:  General: awake, alert, NAD HEENT: atraumatic,  clear conjunctiva, anicteric sclera, MMM, hearing grossly normal Respiratory: normal respiratory effort. Mild scattered wheezing Cardiovascular: normal S1/S2, RRR, no JVD, murmurs, quick capillary refill  Nervous: A&O x3. no gross focal neurologic deficits, normal speech Extremities: moves all equally, no edema, normal tone. FB palpated on left lateral ankle. Tender to palpation and overlying skin is erythematous.  Skin: dry, intact, normal temperature, normal color. No rashes, lesions or ulcers on exposed skin Psychiatry: normal mood, congruent affect  Labs   I have personally reviewed following labs and imaging studies Admission on 06/13/2021  Component  Date Value Ref Range Status   WBC 06/12/2021 13.2 (H)  4.0 - 10.5 K/uL Final   RBC 06/12/2021 3.68 (L)  4.22 - 5.81 MIL/uL Final   Hemoglobin 06/12/2021 11.5 (L)  13.0 - 17.0 g/dL Final   HCT 06/12/2021 35.6 (L)  39.0 - 52.0 % Final   MCV 06/12/2021 96.7  80.0 - 100.0 fL Final   MCH 06/12/2021 31.3  26.0 - 34.0 pg Final   MCHC 06/12/2021 32.3  30.0 - 36.0 g/dL Final   RDW 06/12/2021 13.7  11.5 - 15.5 % Final   Platelets 06/12/2021 192  150 - 400 K/uL Final   nRBC 06/12/2021 0.0  0.0 - 0.2 % Final   Sodium 06/12/2021 133 (L)  135 - 145 mmol/L Final   Potassium 06/12/2021 4.1  3.5 - 5.1 mmol/L Final   Chloride 06/12/2021 100  98 - 111 mmol/L Final   CO2 06/12/2021 24  22 - 32 mmol/L Final   Glucose, Bld 06/12/2021 102 (H)  70 - 99 mg/dL Final   BUN 06/12/2021 30 (H)  8 - 23 mg/dL Final   Creatinine, Ser 06/12/2021 2.25 (H)  0.61 - 1.24 mg/dL Final   Calcium 06/12/2021 9.5  8.9 - 10.3 mg/dL Final   GFR, Estimated 06/12/2021 29 (L)  >60 mL/min Final   Anion gap 06/12/2021 9  5 - 15 Final   SARS Coronavirus 2 by RT PCR 06/13/2021 NEGATIVE  NEGATIVE Final   Influenza A by PCR 06/13/2021 NEGATIVE  NEGATIVE Final   Influenza B by PCR 06/13/2021 NEGATIVE  NEGATIVE Final   Procalcitonin 06/13/2021 <0.10  ng/mL Final   Troponin I (High  Sensitivity) 06/13/2021 37 (H)  <18 ng/L Final   Hgb A1c MFr Bld 06/12/2021 5.1  4.8 - 5.6 % Final   Mean Plasma Glucose 06/12/2021 99.67  mg/dL Final   B Natriuretic Peptide 06/13/2021 337.8 (H)  0.0 - 100.0 pg/mL Final   Troponin I (High Sensitivity) 06/13/2021 29 (H)  <18 ng/L Final   Troponin I (High Sensitivity) 06/13/2021 26 (H)  <18 ng/L Final   BP 06/13/2021 142/73  mmHg Final   Ao pk vel 06/13/2021 0.85  m/s Final   AV Area VTI 06/13/2021 2.90  cm2 Final   AR max vel 06/13/2021 2.62  cm2 Final   AV Mean grad 06/13/2021 1.5  mmHg Final   AV Peak grad 06/13/2021 2.9  mmHg Final   S' Lateral 06/13/2021 3.20  cm Final   AV Area mean vel 06/13/2021 2.63  cm2 Final   Area-P 1/2 06/13/2021 4.86  cm2 Final   MV VTI 06/13/2021 1.57  cm2 Final   Cholesterol 06/14/2021 125  0 - 200 mg/dL Final   Triglycerides 06/14/2021 45  <150 mg/dL Final   HDL 06/14/2021 45  >40 mg/dL Final   Total CHOL/HDL Ratio 06/14/2021 2.8  RATIO Final   VLDL 06/14/2021 9  0 - 40 mg/dL Final   LDL Cholesterol 06/14/2021 71  0 - 99 mg/dL Final   Sodium 06/14/2021 135  135 - 145 mmol/L Final   Potassium 06/14/2021 4.2  3.5 - 5.1 mmol/L Final   Chloride 06/14/2021 101  98 - 111 mmol/L Final   CO2 06/14/2021 25  22 - 32 mmol/L Final   Glucose, Bld 06/14/2021 176 (H)  70 - 99 mg/dL Final   BUN 06/14/2021 40 (H)  8 - 23 mg/dL Final   Creatinine, Ser 06/14/2021 2.35 (H)  0.61 - 1.24 mg/dL Final   Calcium 06/14/2021  9.5  8.9 - 10.3 mg/dL Final   GFR, Estimated 06/14/2021 27 (L)  >60 mL/min Final   Anion gap 06/14/2021 9  5 - 15 Final   Magnesium 06/14/2021 2.1  1.7 - 2.4 mg/dL Final    Imaging Studies  DG Chest 2 View  Result Date: 06/12/2021 CLINICAL DATA:  Productive cough. EXAM: CHEST - 2 VIEW COMPARISON:  February 14, 2020 FINDINGS: There is a dual lead AICD with stable lead wire positioning. Very mild atelectatic changes are seen within the bilateral lung bases. There is no evidence of a pleural effusion or  pneumothorax. The heart size and mediastinal contours are within normal limits. The visualized skeletal structures are unremarkable. IMPRESSION: Very mild bibasilar atelectasis. Electronically Signed   By: Virgina Norfolk M.D.   On: 06/12/2021 19:20   CT CHEST WO CONTRAST  Result Date: 06/13/2021 CLINICAL DATA:  Shortness of breath and cough. EXAM: CT CHEST WITHOUT CONTRAST TECHNIQUE: Multidetector CT imaging of the chest was performed following the standard protocol without IV contrast. COMPARISON:  None. FINDINGS: Cardiovascular: There is a multi lead AICD. There is marked severity calcification of the thoracic aorta, without evidence of aortic aneurysm. Normal heart size with marked severity coronary artery calcification. No pericardial effusion. Mediastinum/Nodes: Mild AP window, pretracheal and right hilar lymphadenopathy is seen. Thyroid gland, trachea, and esophagus demonstrate no significant findings. Lungs/Pleura: Mild areas of atelectasis and/or early infiltrate are seen along the anterior aspects of the bilateral upper lobes and posterior aspect of the right lower lobe. Small atelectatic changes are also seen scattered throughout both lungs. 7 mm and 8 mm noncalcified posterolateral and posteromedial right lower lobe lung nodules are seen (axial CT images 106 and 136, CT series 3). An 8 mm sub solid lung nodule versus focal scar is noted within the left lower lobe (axial CT image 131, CT series 3). There is no evidence of a pleural effusion or pneumothorax. Upper Abdomen: There is a very small hiatal hernia. 3 mm, 7 mm and 9 mm foci of parenchymal low attenuation are noted within the right lobe of the liver. Small, similar appearing lesions are seen within the left lobe. Musculoskeletal: Multilevel degenerative changes are seen throughout the thoracic spine. IMPRESSION: 1. 7 mm and 8 mm noncalcified right lower lobe lung nodules with an 8 mm left lower lobe subsolid lung nodule versus focal scar.  Non-contrast chest CT at 3-6 months is recommended. If the nodules are stable at time of repeat CT, then future CT at 18-24 months (from today's scan) is considered optional for low-risk patients, but is recommended for high-risk patients. This recommendation follows the consensus statement: Guidelines for Management of Incidental Pulmonary Nodules Detected on CT Images: From the Fleischner Society 2017; Radiology 2017; 284:228-243. 2. Mild bilateral upper lobe and right lower lobe atelectasis and/or early infiltrate. 3. Findings likely consistent with multiple small hepatic cysts and/or hemangiomas. Correlation with nonemergent hepatic ultrasound is recommended. 4. Aortic atherosclerosis. Aortic Atherosclerosis (ICD10-I70.0). Electronically Signed   By: Virgina Norfolk M.D.   On: 06/13/2021 06:05   ECHOCARDIOGRAM COMPLETE  Result Date: 06/13/2021    ECHOCARDIOGRAM REPORT   Patient Name:   Matthew Zamora Date of Exam: 06/13/2021 Medical Rec #:  [id number]           Height:       68.0 in Accession #:    [id number]          Weight:       150.0 lb Date of Birth:  April 11, 1941           BSA:          1.809 m Patient Age:    72 years            BP:           142/76 mmHg Patient Gender: M                   HR:           73 bpm. Exam Location:  [facility] Procedure: 2D Echo, Cardiac Doppler and Color Doppler Indications:     CHF-acute systolic Q65.78  History:         Patient has prior history of Echocardiogram examinations, most                  recent 09/17/2019. CHF; CAD.  Sonographer:     Sherrie Sport Referring Phys:  [id number] Soledad Gerlach NIU Diagnosing Phys: Serafina Royals MD  Sonographer Comments: Suboptimal parasternal window and suboptimal apical window. IMPRESSIONS  1. Left ventricular ejection fraction, by estimation, is 40 to 45%. The left ventricle has mildly decreased function. The left ventricle demonstrates global hypokinesis. The left ventricular internal cavity size was mildly dilated. Left ventricular diastolic  parameters are consistent with Grade I diastolic dysfunction (impaired relaxation).  2. Right ventricular systolic function is normal. The right ventricular size is normal.  3. The mitral valve is normal in structure. Mild mitral valve regurgitation.  4. The aortic valve is normal in structure. Aortic valve regurgitation is not visualized. FINDINGS  Left Ventricle: Left ventricular ejection fraction, by estimation, is 40 to 45%. The left ventricle has mildly decreased function. The left ventricle demonstrates global hypokinesis. The left ventricular internal cavity size was mildly dilated. There is  no left ventricular hypertrophy. Left ventricular diastolic parameters are consistent with Grade I diastolic dysfunction (impaired relaxation). Right Ventricle: The right ventricular size is normal. No increase in right ventricular wall thickness. Right ventricular systolic function is normal. Left Atrium: Left atrial size was normal in size. Right Atrium: Right atrial size was normal in size. Pericardium: There is no evidence of pericardial effusion. Mitral Valve: The mitral valve is normal in structure. Mild mitral valve regurgitation. MV peak gradient, 5.4 mmHg. The mean mitral valve gradient is 2.0 mmHg. Tricuspid Valve: The tricuspid valve is normal in structure. Tricuspid valve regurgitation is mild. Aortic Valve: The aortic valve is normal in structure. Aortic valve regurgitation is not visualized. Aortic valve mean gradient measures 1.5 mmHg. Aortic valve peak gradient measures 2.9 mmHg. Aortic valve area, by VTI measures 2.90 cm. Pulmonic Valve: The pulmonic valve was normal in structure. Pulmonic valve regurgitation is not visualized. Aorta: The aortic root and ascending aorta are structurally normal, with no evidence of dilitation. IAS/Shunts: No atrial level shunt detected by color flow Doppler.  LEFT VENTRICLE PLAX 2D LVIDd:         4.40 cm   Diastology LVIDs:         3.20 cm   LV e' medial:    4.24 cm/s LV  PW:         1.40 cm   LV E/e' medial:  25.9 LV IVS:        0.95 cm   LV e' lateral:   7.51 cm/s LVOT diam:     2.00 cm   LV E/e' lateral: 14.6 LV SV:         49 LV SV Index:   27 LVOT Area:  3.14 cm  RIGHT VENTRICLE RV Basal diam:  3.90 cm RV S prime:     14.90 cm/s TAPSE (M-mode): 3.0 cm LEFT ATRIUM              Index        RIGHT ATRIUM           Index LA diam:        4.30 cm  2.38 cm/m   RA Area:     19.10 cm LA Vol (A2C):   132.0 ml 72.99 ml/m  RA Volume:   57.40 ml  31.74 ml/m LA Vol (A4C):   61.5 ml  34.01 ml/m LA Biplane Vol: 90.7 ml  50.15 ml/m  AORTIC VALVE                    PULMONIC VALVE AV Area (Vmax):    2.62 cm     PV Vmax:        0.74 m/s AV Area (Vmean):   2.63 cm     PV Vmean:       50.900 cm/s AV Area (VTI):     2.90 cm     PV VTI:         0.148 m AV Vmax:           85.35 cm/s   PV Peak grad:   2.2 mmHg AV Vmean:          56.200 cm/s  PV Mean grad:   1.0 mmHg AV VTI:            0.170 m      RVOT Peak grad: 2 mmHg AV Peak Grad:      2.9 mmHg AV Mean Grad:      1.5 mmHg LVOT Vmax:         71.10 cm/s LVOT Vmean:        47.000 cm/s LVOT VTI:          0.157 m LVOT/AV VTI ratio: 0.92 MITRAL VALVE                TRICUSPID VALVE MV Area (PHT): 4.86 cm     TR Peak grad:   11.8 mmHg MV Area VTI:   1.57 cm     TR Vmax:        172.00 cm/s MV Peak grad:  5.4 mmHg MV Mean grad:  2.0 mmHg     SHUNTS MV Vmax:       1.16 m/s     Systemic VTI:  0.16 m MV Vmean:      69.4 cm/s    Systemic Diam: 2.00 cm MV Decel Time: 156 msec     Pulmonic VTI:  0.125 m MV E velocity: 110.00 cm/s MV A velocity: 48.80 cm/s MV E/A ratio:  2.25 Serafina Royals MD Electronically signed by Serafina Royals MD Signature Date/Time: 06/13/2021/4:58:08 PM    Final     Medications   Scheduled Meds:  amiodarone  400 mg Oral Daily   apixaban  5 mg Oral BID   aspirin  81 mg Oral Q1200   atorvastatin  80 mg Oral Daily   azithromycin  250 mg Oral Daily   furosemide  40 mg Intravenous Q12H   gabapentin  100 mg Oral QHS    ipratropium-albuterol  3 mL Nebulization Q4H   magnesium oxide  400 mg Oral Daily   melatonin  10 mg Oral QHS   methylPREDNISolone (SOLU-MEDROL) injection  40 mg Intravenous Q12H  metoprolol succinate  75 mg Oral BID   mexiletine  150 mg Oral Q8H   mometasone-formoterol  2 puff Inhalation BID   multivitamin with minerals  1 tablet Oral Daily   pantoprazole  40 mg Oral Daily   tamsulosin  0.4 mg Oral Daily   No recently discontinued medications to reconcile  LOS: 1 day   Richarda Osmond, DO [facility] 06/14/2021, 7:29 AM   Available by Epic secure chat 7AM-7PM. If 7PM-7AM, please contact night-coverage Refer to [email or web address] to contact the [facility] Attending or Consulting provider for this pt

## 2021-06-14 NOTE — Evaluation (Signed)
Occupational Therapy Evaluation Patient Details Name: Matthew Zamora MRN: [id number] DOB: July 13, 1940 Today's Date: 06/14/2021   History of Present Illness 80 y.o. male with medical history significant of sCHF with EF 25-30%, hypertension, hyperlipidemia, GERD, AAA, atrial fibrillation on Eliquis, CAD, CKD stage IIIb, prostate cancer, AICD placement, who presents with shortness breath and cough.   Clinical Impression   [name] was seen for OT evaluation this date. Prior to hospital admission, pt was MOD I for mobility and ADLs using 4WW. Pt lives alone, daughter checks in ~2x/day. Pt presents to acute OT demonstrating impaired ADL performance and functional mobility 2/2 decreased activity tolerance and functional ROM deficits. Pt currently requires MOD A doff L ankle brace at bed level. SBA + RW for toothbrushing standing sinkside and ~100 ft mobility. Pt would benefit from skilled OT to address ECS and carryover education. Upon hospital discharge, recommend no OT follow up.       Recommendations for follow up therapy are one component of a multi-disciplinary discharge planning process, led by the attending physician.  Recommendations may be updated based on patient status, additional functional criteria and insurance authorization.   Follow Up Recommendations  No OT follow up    Assistance Recommended at Discharge Set up Supervision/Assistance  Functional Status Assessment  Patient has had a recent decline in their functional status and demonstrates the ability to make significant improvements in function in a reasonable and predictable amount of time.  Equipment Recommendations  BSC/3in1    Recommendations for Other Services       Precautions / Restrictions Precautions Precautions: Fall Required Braces or Orthoses:  (L ankle brace) Restrictions Weight Bearing Restrictions: No      Mobility Bed Mobility Overal bed mobility: Independent                   Transfers Overall transfer level: Needs assistance Equipment used: Rolling walker (2 wheels) Transfers: Sit to/from Stand Sit to Stand: Supervision           General transfer comment: cues for safety      Balance Overall balance assessment: Mild deficits observed, not formally tested                                         ADL either performed or assessed with clinical judgement   ADL Overall ADL's : Needs assistance/impaired                                       General ADL Comments: MOD A doff L ankle brace at bed level. SBA + RW for toothbrushing standing sinkside. SBA + RW for ADL t/f      Pertinent Vitals/Pain Pain Assessment: 0-10 Pain Score: 6  Pain Location: L ankle Pain Descriptors / Indicators: Discomfort;Dull Pain Intervention(s): Limited activity within patient's tolerance;Repositioned     Hand Dominance     Extremity/Trunk Assessment Upper Extremity Assessment Upper Extremity Assessment: Overall WFL for tasks assessed   Lower Extremity Assessment Lower Extremity Assessment: Overall WFL for tasks assessed       Communication Communication Communication: No difficulties   Cognition Arousal/Alertness: Awake/alert Behavior During Therapy: WFL for tasks assessed/performed Overall Cognitive Status: Within Functional Limits for tasks assessed  Home Living Family/patient expects to be discharged to:: Private residence Living Arrangements: Alone Available Help at Discharge: Family   Home Access: Stairs to enter Technical brewer of Steps: 3                   Home Equipment: Rollator (4 wheels);BSC/3in1   Additional Comments: daughter in law comes by 2x/day      Prior Functioning/Environment Prior Level of Function : Independent/Modified Independent             Mobility Comments: Pt reports that he only really gets out  for MD appointments but is able to manage in home mobility w/o issue ADLs Comments: daughter-in-law brings meals daily and helps with some ADLs, but he does his laundry, bathes, etc        OT Problem List: Decreased activity tolerance;Impaired balance (sitting and/or standing);Decreased safety awareness      OT Treatment/Interventions: Self-care/ADL training;Therapeutic exercise;Energy conservation;DME and/or AE instruction;Therapeutic activities;Patient/family education;Balance training    OT Goals(Current goals can be found in the care plan section) Acute Rehab OT Goals Patient Stated Goal: to go home OT Goal Formulation: With patient Time For Goal Achievement: 06/28/21 Potential to Achieve Goals: Good ADL Goals Pt Will Perform Grooming: Independently;standing Pt Will Perform Lower Body Dressing: Independently;sit to/from stand Pt Will Transfer to Toilet: Independently;ambulating;regular height toilet  OT Frequency: Min 1X/week   Barriers to D/C:            Co-evaluation              AM-PAC OT "6 Clicks" Daily Activity     Outcome Measure Help from another person eating meals?: None Help from another person taking care of personal grooming?: A Little Help from another person toileting, which includes using toliet, bedpan, or urinal?: None Help from another person bathing (including washing, rinsing, drying)?: A Little Help from another person to put on and taking off regular upper body clothing?: None Help from another person to put on and taking off regular lower body clothing?: A Lot 6 Click Score: 20   End of Session Equipment Utilized During Treatment: Rolling walker (2 wheels)  Activity Tolerance: Patient tolerated treatment well Patient left: in bed;with call bell/phone within reach;with bed alarm set  OT Visit Diagnosis: Other abnormalities of gait and mobility (R26.89)                Time: 3267-1245 OT Time Calculation (min): 15 min Charges:  OT General  Charges $OT Visit: 1 Visit OT Evaluation $OT Eval Low Complexity: 1 Low OT Treatments $Self Care/Home Management : 8-22 mins  Dessie Coma, M.S. OTR/L  06/14/21, 4:16 PM  ascom [phone]

## 2021-06-14 NOTE — Evaluation (Signed)
Physical Therapy Evaluation Patient Details Name: Matthew Zamora MRN: [id number] DOB: 05-Mar-1941 Today's Date: 06/14/2021  History of Present Illness  80 y.o. male with medical history significant of sCHF with EF 25-30%, hypertension, hyperlipidemia, GERD, AAA, atrial fibrillation on Eliquis, CAD, CKD stage IIIb, prostate cancer, AICD placement, who presents with shortness breath and cough.  Clinical Impression  Pt did well with bed mobility and was able to ambulate ~100 ft with relatively ease.  He did have L ankle fx 2 months ago but has been cleared to ambulate on it (has been using ankle brace) but is c/o com pain with "knot" near where the top of the brace may be "rubbing."  He was able to tolerate ambulation (brace donned) w/o limp or excessive UE reliance.  Pt safe to return home but would benefit from continued HHPT at discharge.     Recommendations for follow up therapy are one component of a multi-disciplinary discharge planning process, led by the attending physician.  Recommendations may be updated based on patient status, additional functional criteria and insurance authorization.  Follow Up Recommendations Home health PT    Assistance Recommended at Discharge Intermittent Supervision/Assistance  Functional Status Assessment Patient has had a recent decline in their functional status and demonstrates the ability to make significant improvements in function in a reasonable and predictable amount of time.  Equipment Recommendations  None recommended by PT    Recommendations for Other Services       Precautions / Restrictions Precautions Precautions: Fall Required Braces or Orthoses:  (placed L ankle brace) Restrictions Weight Bearing Restrictions: No      Mobility  Bed Mobility Overal bed mobility: Independent                  Transfers Overall transfer level: Independent Equipment used: Rolling walker (2 wheels)               General transfer  comment: Pt was able to get himself to standing, needed UEs to rise but minimal cuing    Ambulation/Gait Ambulation/Gait assistance: Min guard Gait Distance (Feet): 100 Feet Assistive device: Rolling walker (2 wheels)         General Gait Details: Pt was able to ambulate with consistent speed and had only negligible limp on L.  His O2 remained in the 90s on room air and HR stayed in the 90-110 range t/o the effort.  Stairs            Wheelchair Mobility    Modified Rankin (Stroke Patients Only)       Balance Overall balance assessment: Modified Independent                                           Pertinent Vitals/Pain Pain Assessment: 0-10 Pain Score: 6  Pain Location: L ankle    Home Living Family/patient expects to be discharged to:: Private residence Living Arrangements: Alone Available Help at Discharge: Family (d-in-l comes by 2x/day)   Home Access: Stairs to enter Entrance Stairs-Rails:  (yes) Entrance Stairs-Number of Steps: 3     Home Equipment: Rollator (4 wheels);BSC/3in1      Prior Function Prior Level of Function : Independent/Modified Independent             Mobility Comments: Pt reports that he only really gets out for MD appointments but is able to manage in home mobility w/o  issue ADLs Comments: daughter-in-law brings meals daily and helps with some ADLs, but he does his laundry, bathes, etc     Hand Dominance        Extremity/Trunk Assessment   Upper Extremity Assessment Upper Extremity Assessment: Overall WFL for tasks assessed;Generalized weakness    Lower Extremity Assessment Lower Extremity Assessment: Overall WFL for tasks assessed;Generalized weakness       Communication   Communication: No difficulties  Cognition Arousal/Alertness: Awake/alert Behavior During Therapy: WFL for tasks assessed/performed Overall Cognitive Status: Within Functional Limits for tasks assessed                                           General Comments General comments (skin integrity, edema, etc.): Pt did well with mobility and showed good overall  safety    Exercises     Assessment/Plan    PT Assessment Patient needs continued PT services  PT Problem List Decreased strength;Decreased range of motion;Decreased activity tolerance;Decreased balance;Decreased mobility;Decreased knowledge of use of DME;Decreased safety awareness       PT Treatment Interventions Gait training;Functional mobility training;DME instruction;Therapeutic activities;Therapeutic exercise;Balance training;Patient/family education    PT Goals (Current goals can be found in the Care Plan section)  Acute Rehab PT Goals Patient Stated Goal: go home PT Goal Formulation: With patient Time For Goal Achievement: 06/28/21 Potential to Achieve Goals: Good    Frequency Min 2X/week   Barriers to discharge        Co-evaluation               AM-PAC PT "6 Clicks" Mobility  Outcome Measure Help needed turning from your back to your side while in a flat bed without using bedrails?: None Help needed moving from lying on your back to sitting on the side of a flat bed without using bedrails?: None Help needed moving to and from a bed to a chair (including a wheelchair)?: None Help needed standing up from a chair using your arms (e.g., wheelchair or bedside chair)?: None Help needed to walk in hospital room?: None Help needed climbing 3-5 steps with a railing? : None 6 Click Score: 24    End of Session Equipment Utilized During Treatment: Gait belt Activity Tolerance: Patient tolerated treatment well Patient left: with bed alarm set;with call bell/phone within reach Nurse Communication: Mobility status (able to maintain mid/high 90s on room air t/o session) PT Visit Diagnosis: Muscle weakness (generalized) (M62.81);Difficulty in walking, not elsewhere classified (R26.2)    Time: 0300-9233 PT Time Calculation  (min) (ACUTE ONLY): 25 min   Charges:   PT Evaluation $PT Eval Low Complexity: 1 Low PT Treatments $Gait Training: 8-22 mins        Kreg Shropshire, DPT 06/14/2021, 12:42 PM

## 2021-06-14 NOTE — TOC Initial Note (Signed)
Transition of Care [facility]) - Initial/Assessment Note    Patient Details  Name: Matthew Zamora MRN: [id number] Date of Birth: 1941-03-17  [facility]) CM/SW Contact:    Pete Pelt, RN Phone Number: 06/14/2021, 5:11 PM  Clinical Narrative:     patient lives at home alone, but his daughter in law is an Therapist, sports and assists him with meals and care.  She also transports him to appointments and to the pharmacy.  She assists him with medication, and he is able to take those as needed.  Patient is a client of [facility] home health for PT.  He has a walker and a BSC at home.    He accepts home health after discharge. He has no further concerns about returning home at discharge.              Expected Discharge Plan: [place] Barriers to Discharge: Continued Medical Work up   Patient Goals and CMS Choice Patient states their goals for this hospitalization and ongoing recovery are:: To get home and get back to walking   Choice offered to / list presented to : NA  Expected Discharge Plan and Services Expected Discharge Plan: [place] arrangements for the past 2 months: Single Family Home                                      Prior Living Arrangements/Services Living arrangements for the past 2 months: Single Family Home Lives with:: Self Patient language and need for interpreter reviewed:: Yes Do you feel safe going back to the place where you live?: Yes      Need for Family Participation in Patient Care: Yes (Comment) Care giver support system in place?: Yes (comment)   Criminal Activity/Legal Involvement Pertinent to Current Situation/Hospitalization: No - Comment as needed  Activities of Daily Living Home Assistive Devices/Equipment: Oxygen ADL Screening (condition at time of admission) Patient's cognitive ability adequate to safely complete daily activities?: Yes Is the patient  deaf or have difficulty hearing?: Yes Does the patient have difficulty seeing, even when wearing glasses/contacts?: No Does the patient have difficulty concentrating, remembering, or making decisions?: No Patient able to express need for assistance with ADLs?: Yes Does the patient have difficulty dressing or bathing?: No Independently performs ADLs?: Yes (appropriate for developmental age) Does the patient have difficulty walking or climbing stairs?: Yes Weakness of Legs: Both Weakness of Arms/Hands: None  Permission Sought/Granted Permission sought to share information with : Case Manager Permission granted to share information with : Yes, Verbal Permission Granted     Permission granted to share info w AGENCY: Home health agencies        Emotional Assessment Appearance:: Appears stated age Attitude/Demeanor/Rapport: Gracious, Engaged Affect (typically observed): Pleasant, Appropriate Orientation: : Oriented to Self, Oriented to Place, Oriented to  Time, Oriented to Situation Alcohol / Substance Use: Not Applicable Psych Involvement: No (comment)  Admission diagnosis:  Acute respiratory failure with hypoxia ([place]) [J96.01] HCAP (healthcare-associated pneumonia) [J18.9] Patient Active Problem List   Diagnosis Date Noted   Acute respiratory failure with hypoxia ([place])    HCAP (healthcare-associated pneumonia) 06/13/2021   Acute on chronic systolic CHF (congestive heart failure) ([place]) 06/13/2021   Elevated troponin 06/13/2021   CKD (chronic kidney disease), 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   Lung nodule 06/13/2021  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   COPD exacerbation ([place]) 06/13/2021   Volume depletion 12/15/2019   Hypotension 12/15/2019   Cardiorenal syndrome    Atrial fibrillation with RVR ([name])    Defibrillator discharge    Tachycardia 10/25/2019   CHF (congestive heart failure) ([place]) 09/18/2019   Acute on chronic combined systolic and diastolic CHF (congestive heart failure) ([place]) 09/17/2019    CAD (coronary artery disease) 09/17/2019   Ischemic cardiomyopathy 09/17/2019   CKD (chronic kidney disease) stage 3, GFR 30-59 ml/min ([name]) 09/17/2019   Acute hypoxemic respiratory failure ([place]) 09/17/2018   AAA (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 07/06/2018   Essential hypertension 07/06/2018   Hyperlipidemia 07/06/2018   Paroxysmal atrial fibrillation ([place])    Acute on chronic respiratory failure with hypoxia ([place]) 04/26/2018   PCP:  [place] Pink, MD Pharmacy:   [facility] [address] ([address]), [place] [address] [place]) [place] [address] Phone: [phone] Fax: [phone]     Social Determinants of Health (SDOH) Interventions    Readmission Risk Interventions No flowsheet data found.

## 2021-06-15 LAB — BASIC METABOLIC PANEL
Anion gap: 9 (ref 5–15)
BUN: 46 mg/dL — ABNORMAL HIGH (ref 8–23)
CO2: 26 mmol/L (ref 22–32)
Calcium: 9.7 mg/dL (ref 8.9–10.3)
Chloride: 102 mmol/L (ref 98–111)
Creatinine, Ser: 2.36 mg/dL — ABNORMAL HIGH (ref 0.61–1.24)
GFR, Estimated: 27 mL/min — ABNORMAL LOW (ref >60)
Glucose, Bld: 156 mg/dL — ABNORMAL HIGH (ref 70–99)
Potassium: 4 mmol/L (ref 3.5–5.1)
Sodium: 137 mmol/L (ref 135–145)

## 2021-06-15 MED ORDER — POLYVINYL ALCOHOL 1.4 % OP SOLN
1.0000 [drp] | OPHTHALMIC | Status: DC | PRN
  Administered 2021-06-15: 1 [drp] via OPHTHALMIC
  Filled 2021-06-15: qty 15

## 2021-06-15 MED ORDER — TORSEMIDE 20 MG PO TABS
20.0000 mg | ORAL_TABLET | Freq: Every day | ORAL | Status: DC
  Administered 2021-06-15 – 2021-06-16 (×2): 20 mg via ORAL
  Filled 2021-06-15 (×2): qty 1

## 2021-06-15 NOTE — Care Management Important Message (Signed)
Important Message  Patient Details  Name: Matthew Zamora MRN: [id number] Date of Birth: 07-16-1940   Medicare Important Message Given:  Yes     Juliann Pulse A Tija Biss 06/15/2021, 1:38 PM

## 2021-06-15 NOTE — Plan of Care (Signed)
  Problem: Health Behavior/Discharge Planning: Goal: Ability to manage health-related needs will improve Outcome: Progressing   Problem: Clinical Measurements: Goal: Ability to maintain clinical measurements within normal limits will improve Outcome: Progressing   

## 2021-06-15 NOTE — Progress Notes (Signed)
Physical Therapy Treatment Patient Details Name: Matthew Zamora MRN: [id number] DOB: [birth year] Today's Date: 06/15/2021   History of Present Illness 80 y.o. male with medical history significant of sCHF with EF 25-30%, hypertension, hyperlipidemia, GERD, AAA, atrial fibrillation on Eliquis, CAD, CKD stage IIIb, prostate cancer, AICD placement, who presents with shortness breath and cough.    PT Comments    Pt seen for PT tx with pt agreeable. PT loosely dons/doffs L ankle brace total assist. Pt is independent with bed mobility & sit<>stand & can ambulate 1 lap around nurses station with RW. Pt negotiates 3 steps laterally with 1 rail with min assist with pt electing gait pattern that feels most comfortable/stable to him. Will continue to follow pt acutely to address high level balance & stairs.    Recommendations for follow up therapy are one component of a multi-disciplinary discharge planning process, led by the attending physician.  Recommendations may be updated based on patient status, additional functional criteria and insurance authorization.  Follow Up Recommendations  Home health PT     Assistance Recommended at Discharge Intermittent Supervision/Assistance  Equipment Recommendations  None recommended by PT    Recommendations for Other Services       Precautions / Restrictions Precautions Precautions: Fall Restrictions Weight Bearing Restrictions: No     Mobility  Bed Mobility Overal bed mobility: Independent                  Transfers Overall transfer level: Needs assistance Equipment used: None Transfers: Sit to/from Stand Sit to Stand: Modified independent (Device/Increase time)                Ambulation/Gait Ambulation/Gait assistance: Supervision Gait Distance (Feet): 200 Feet Assistive device: Rolling walker (2 wheels) Gait Pattern/deviations: WFL(Within Functional Limits)           Stairs Stairs: Yes Stairs assistance: Min  assist Stair Management: One rail Right;Step to pattern Number of Stairs: 3 General stair comments: Pt elects to ascend leading with LLE & descend leading with RLE; pt educates pt on compensatory pattern with pt reporting he feels more comfortable performing it the way he prefers   Wheelchair Mobility    Modified Rankin (Stroke Patients Only)       Balance Overall balance assessment: Mild deficits observed, not formally tested                                          Cognition Arousal/Alertness: Awake/alert Behavior During Therapy: WFL for tasks assessed/performed Overall Cognitive Status: Within Functional Limits for tasks assessed                                          Exercises      General Comments General comments (skin integrity, edema, etc.): Pt on room air throughout session with SPO2 reading as low as 74% but poor pleth waveform, when pt takes standing break pt with improved waveform & SPO2 >90%. PT dons/doffs L ankle wrap total assist (pt reports his daughter in law does this for him at home). Pt very concerned about feeling screws in L ankle/foot with PT showing him x-ray images.      Pertinent Vitals/Pain Pain Assessment: No/denies pain    Home Living  Prior Function            PT Goals (current goals can now be found in the care plan section) Acute Rehab PT Goals Patient Stated Goal: go home PT Goal Formulation: With patient Time For Goal Achievement: 06/28/21 Potential to Achieve Goals: Good Progress towards PT goals: Progressing toward goals    Frequency    Min 2X/week      PT Plan Current plan remains appropriate    Co-evaluation              AM-PAC PT "6 Clicks" Mobility   Outcome Measure  Help needed turning from your back to your side while in a flat bed without using bedrails?: None Help needed moving from lying on your back to sitting on the side of a flat  bed without using bedrails?: None Help needed moving to and from a bed to a chair (including a wheelchair)?: None Help needed standing up from a chair using your arms (e.g., wheelchair or bedside chair)?: None Help needed to walk in hospital room?: None Help needed climbing 3-5 steps with a railing? : A Little 6 Click Score: 23    End of Session Equipment Utilized During Treatment: Gait belt Activity Tolerance: Patient tolerated treatment well Patient left: in bed;with call bell/phone within reach   PT Visit Diagnosis: Muscle weakness (generalized) (M62.81);Difficulty in walking, not elsewhere classified (R26.2)     Time: 0086-7619 PT Time Calculation (min) (ACUTE ONLY): 18 min  Charges:  $Therapeutic Activity: 8-22 mins                     [name], PT, DPT 06/15/21, 2:32 PM    [name] 06/15/2021, 2:30 PM

## 2021-06-15 NOTE — Progress Notes (Signed)
PROGRESS NOTE  Matthew Zamora    DOB: 15-Nov-1940, 80 y.o.  NID:[id number]  PCP: Maryland Pink, MD   Code Status: Full Code   DOA: 06/13/2021   LOS: 2  Brief Narrative of Current Hospitalization  Matthew Zamora is a 80 y.o. male with a PMH significant for HFrEF, HTN, HLD, GERD, AAA, Afib on eliquis, CAD, CKDIIIb, prostate cancer, AICD, recent left ankle surgery for fracture. They presented from home to the ED on 06/13/2021 with SOB x3 days. In the ED, it was found that they had likely CHF exacerbation and COPD exacerbation. They were treated with IV diuretics as well as steroids, breathing treatments and antibiotics.  Patient was admitted to medicine service for further workup and management of acute respiratory distress with hypoxia as outlined in detail below.  06/15/21 -stable on room air  Assessment & Plan  Principal Problem:   Acute on chronic systolic CHF (congestive heart failure) ([name]) Active Problems:   Paroxysmal atrial fibrillation ([name])   Essential hypertension   Hyperlipidemia   CAD (coronary artery disease)   Elevated troponin   CKD (chronic kidney disease), stage IIIb   Lung nodule   Liver cyst   COPD exacerbation ([name])   Acute respiratory failure with hypoxia ([name])  Acute hypoxic respiratory failure- resolved. stable on room air and no dyspnea on exertion.  COPD exacerbation- s/p vanc and cefepime in ED, followed by azithromycin. Lung sounds clear. Leaning towards COPD as primary diagnosis given physical exam.  -continue Bronchodilators -continue steroids -Z pak  -Mucinex for cough  -Incentive spirometry -Follow up sputum, blood culture x2  Acute on chronic systolic CHF (congestive heart failure) ([place]): appears euvolemic on exam. Echo shows EF 40-45% which is improved from previous. - daily weights - strict I/O - hold diuretics - wean to room air as tolerated - sodium restriction  AKI on CKD (chronic kidney disease), stage IIIb: Slightly  worsening than baseline.  Baseline creatinine 1.7-2.0 recently. Maybe due to cardiorenal syndrome and worsening with diuresis. Patients creatinine is stable. If no improvement tomorrow after discontinuing the diuretics, will plan to discharge for outpatient follow up.  -Avoid using renal toxic medications -Follow-up with BMP  L ankle fracture s/p fixation- surgery about 2 months ago, per patient. States that he has been wearing brace consistently in that time and ambulating but having pain and concern that his hardware is protruding from his ankle. Xray showed fracture is stable but screw heads are what is felt protruding on lateral ankle. Still tender to palpation but skin is no longer erythematous since removing brace. Patient is able to ambulate normally - OP follow up ortho - PT/OT   Paroxysmal atrial fibrillation ([name])- chronic, stable -Amiodarone, Mexitil, metoprolol -Eliquis   Essential hypertension- chronic, stable -Metoprolol   Hyperlipidemia- chronic, stable       -Lipitor   CAD (coronary artery disease) and elevated troponin: Troponin 37, 29.  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 Yes; Fluid consistency: Thin  Diet effective now       Question Answer Comment  Room service appropriate? Yes   Fluid consistency: Thin  06/13/21 0822            Subjective 06/15/21    Pt reports feeling well today. Denies Sob, CP, DOE. Able to ambulate independently. Uses a rolling walker at home.   Disposition Plan & Communication  Patient status: Inpatient  Admitted From: Home Disposition: Home Anticipated discharge date: 12/31  Family Communication: none  Consults,  Procedures, Significant Events  Consultants:  none  Procedures/significant events:  None  Antimicrobials:  Anti-infectives (From admission, onward)    Start     Dose/Rate Route Frequency Ordered Stop   06/15/21 0600  vancomycin (VANCOREADY) IVPB 1250 mg/250 mL  Status:  Discontinued        1,250 mg 166.7 mL/hr over 90 Minutes Intravenous Every 48 hours 06/13/21 0843 06/13/21 1759   06/14/21 1800  azithromycin (ZITHROMAX) tablet 250 mg       See Hyperspace for full Linked Orders Report.   250 mg Oral Daily 06/13/21 1800 06/18/21 1759   06/14/21 0500  ceFEPIme (MAXIPIME) 2 g in sodium chloride 0.9 % 100 mL IVPB  Status:  Discontinued        2 g 200 mL/hr over 30 Minutes Intravenous Every 24 hours 06/13/21 0843 06/13/21 1759   06/13/21 1815  azithromycin (ZITHROMAX) tablet 500 mg       See Hyperspace for full Linked Orders Report.   500 mg Oral Daily 06/13/21 1800 06/13/21 1806   06/13/21 0545  cefTRIAXone (ROCEPHIN) 1 g in sodium chloride 0.9 % 100 mL IVPB  Status:  Discontinued        1 g 200 mL/hr over 30 Minutes Intravenous  Once 06/13/21 0534 06/13/21 0535   06/13/21 0545  azithromycin (ZITHROMAX) tablet 500 mg  Status:  Discontinued        500 mg Oral  Once 06/13/21 0534 06/13/21 0535   06/13/21 0545  ceFEPIme (MAXIPIME) 1 g in sodium chloride 0.9 % 100 mL IVPB        1 g 200 mL/hr over 30 Minutes Intravenous  Once 06/13/21 0535 06/13/21 0640   06/13/21 0545  vancomycin (VANCOCIN) IVPB 1000 mg/200 mL premix        1,000 mg 200 mL/hr over 60 Minutes Intravenous  Once 06/13/21 0535 06/13/21 0812       Objective   Vitals:   06/14/21 2032 06/15/21 0500 06/15/21 0606 06/15/21 0920  BP: (!) 116/57  (!) 128/55 (!) 121/54  Pulse: 70  64 65  Resp: 18   20  Temp: 97.7 F (36.5 C)  (!) 97.5 F (36.4 C) 98 F (36.7 C)  TempSrc: Oral  Oral Oral  SpO2: 98%  98% 95%  Weight:  68.9 kg    Height:        Intake/Output Summary (Last 24 hours) at 06/15/2021 0947 Last data filed  at 06/15/2021 0610 Gross per 24 hour  Intake 700 ml  Output 100 ml  Net 600 ml    Filed Weights   06/12/21 1832 06/15/21 0500  Weight: 68 kg 68.9 kg    Patient BMI: Body mass index is 23.1 kg/m.   Physical Exam:  General: awake, alert, NAD HEENT: atraumatic, clear conjunctiva, anicteric sclera, MMM, hearing grossly normal Respiratory: normal respiratory effort. Mild scattered wheezing Cardiovascular: normal S1/S2, RRR, no JVD, murmurs, quick capillary refill  Nervous: A&O x3. no gross focal neurologic deficits, normal speech Extremities: moves all equally, no edema, normal tone. FB palpated on left lateral ankle. Tender to palpation. Skin: dry, intact, normal temperature, normal color.  No rashes, lesions or ulcers on exposed skin Psychiatry: normal mood, congruent affect  Labs   I have personally reviewed following labs and imaging studies Admission on 06/13/2021  Component Date Value Ref Range Status   WBC 06/12/2021 13.2 (H)  4.0 - 10.5 K/uL Final   RBC 06/12/2021 3.68 (L)  4.22 - 5.81 MIL/uL Final   Hemoglobin 06/12/2021 11.5 (L)  13.0 - 17.0 g/dL Final   HCT 06/12/2021 35.6 (L)  39.0 - 52.0 % Final   MCV 06/12/2021 96.7  80.0 - 100.0 fL Final   MCH 06/12/2021 31.3  26.0 - 34.0 pg Final   MCHC 06/12/2021 32.3  30.0 - 36.0 g/dL Final   RDW 06/12/2021 13.7  11.5 - 15.5 % Final   Platelets 06/12/2021 192  150 - 400 K/uL Final   nRBC 06/12/2021 0.0  0.0 - 0.2 % Final   Sodium 06/12/2021 133 (L)  135 - 145 mmol/L Final   Potassium 06/12/2021 4.1  3.5 - 5.1 mmol/L Final   Chloride 06/12/2021 100  98 - 111 mmol/L Final   CO2 06/12/2021 24  22 - 32 mmol/L Final   Glucose, Bld 06/12/2021 102 (H)  70 - 99 mg/dL Final   BUN 06/12/2021 30 (H)  8 - 23 mg/dL Final   Creatinine, Ser 06/12/2021 2.25 (H)  0.61 - 1.24 mg/dL Final   Calcium 06/12/2021 9.5  8.9 - 10.3 mg/dL Final   GFR, Estimated 06/12/2021 29 (L)  >60 mL/min Final   Anion gap 06/12/2021 9  5 - 15 Final   SARS  Coronavirus 2 by RT PCR 06/13/2021 NEGATIVE  NEGATIVE Final   Influenza A by PCR 06/13/2021 NEGATIVE  NEGATIVE Final   Influenza B by PCR 06/13/2021 NEGATIVE  NEGATIVE Final   Procalcitonin 06/13/2021 <0.10  ng/mL Final   Troponin I (High Sensitivity) 06/13/2021 37 (H)  <18 ng/L Final   Hgb A1c MFr Bld 06/12/2021 5.1  4.8 - 5.6 % Final   Mean Plasma Glucose 06/12/2021 99.67  mg/dL Final   B Natriuretic Peptide 06/13/2021 337.8 (H)  0.0 - 100.0 pg/mL Final   Troponin I (High Sensitivity) 06/13/2021 29 (H)  <18 ng/L Final   Troponin I (High Sensitivity) 06/13/2021 26 (H)  <18 ng/L Final   BP 06/13/2021 142/73  mmHg Final   Ao pk vel 06/13/2021 0.85  m/s Final   AV Area VTI 06/13/2021 2.90  cm2 Final   AR max vel 06/13/2021 2.62  cm2 Final   AV Mean grad 06/13/2021 1.5  mmHg Final   AV Peak grad 06/13/2021 2.9  mmHg Final   S' Lateral 06/13/2021 3.20  cm Final   AV Area mean vel 06/13/2021 2.63  cm2 Final   Area-P 1/2 06/13/2021 4.86  cm2 Final   MV VTI 06/13/2021 1.57  cm2 Final   Cholesterol 06/14/2021 125  0 - 200 mg/dL Final   Triglycerides 06/14/2021 45  <150 mg/dL Final   HDL 06/14/2021 45  >40 mg/dL Final   Total CHOL/HDL Ratio 06/14/2021 2.8  RATIO Final   VLDL 06/14/2021 9  0 - 40 mg/dL Final   LDL Cholesterol 06/14/2021 71  0 - 99 mg/dL Final   Sodium 06/14/2021 135  135 - 145 mmol/L Final   Potassium 06/14/2021 4.2  3.5 - 5.1 mmol/L Final   Chloride 06/14/2021 101  98 - 111 mmol/L Final   CO2 06/14/2021 25  22 - 32 mmol/L Final   Glucose, Bld 06/14/2021 176 (H)  70 - 99  mg/dL Final   BUN 06/14/2021 40 (H)  8 - 23 mg/dL Final   Creatinine, Ser 06/14/2021 2.35 (H)  0.61 - 1.24 mg/dL Final   Calcium 06/14/2021 9.5  8.9 - 10.3 mg/dL Final   GFR, Estimated 06/14/2021 27 (L)  >60 mL/min Final   Anion gap 06/14/2021 9  5 - 15 Final   Magnesium 06/14/2021 2.1  1.7 - 2.4 mg/dL Final   Sodium 06/15/2021 137  135 - 145 mmol/L Final   Potassium 06/15/2021 4.0  3.5 - 5.1 mmol/L Final    Chloride 06/15/2021 102  98 - 111 mmol/L Final   CO2 06/15/2021 26  22 - 32 mmol/L Final   Glucose, Bld 06/15/2021 156 (H)  70 - 99 mg/dL Final   BUN 06/15/2021 46 (H)  8 - 23 mg/dL Final   Creatinine, Ser 06/15/2021 2.36 (H)  0.61 - 1.24 mg/dL Final   Calcium 06/15/2021 9.7  8.9 - 10.3 mg/dL Final   GFR, Estimated 06/15/2021 27 (L)  >60 mL/min Final   Anion gap 06/15/2021 9  5 - 15 Final    Imaging Studies  DG Ankle Complete Left  Result Date: 06/14/2021 CLINICAL DATA:  Recent fracture with surgery about 8 weeks ago. Lateral malleolus is miss aligned with hardware protruding on exam. EXAM: LEFT ANKLE COMPLETE - 3+ VIEW COMPARISON:  None. FINDINGS: No prior postoperative comparison studies are available for correlation. There are postoperative changes with rod and screw fixation of the distal fibula. Two short distal screws in the fibula with 2 long screws fixing the rod to the tibia fibular joint. The upper screw heads may protrude slightly from the bone surface and could be palpable. Two screws fix a fracture of the medial malleolus. Screw heads appear to protrude from the bone surface and could be palpable. Correlation with any prior postoperative studies would be useful to see if there has been any change. A fracture lines remain visible. Near anatomic alignment is demonstrated. Soft tissues are unremarkable. Degenerative changes in the ankle joint. IMPRESSION: Postoperative changes in the left ankle as discussed. Electronically Signed   By: Lucienne Capers M.D.   On: 06/14/2021 15:21   ECHOCARDIOGRAM COMPLETE  Result Date: 06/13/2021    ECHOCARDIOGRAM REPORT   Patient Name:   Matthew Zamora Date of Exam: 06/13/2021 Medical Rec #:  [id number]           Height:       68.0 in Accession #:    [id number]          Weight:       150.0 lb Date of Birth:  [birth year]           BSA:          1.809 m Patient Age:    72 years            BP:           142/76 mmHg Patient Gender: M                    HR:           73 bpm. Exam Location:  [facility] Procedure: 2D Echo, Cardiac Doppler and Color Doppler Indications:     CHF-acute systolic W26.37  History:         Patient has prior history of Echocardiogram examinations, most                  recent 09/17/2019. CHF; CAD.  Sonographer:  Sherrie Sport Referring Phys:  [id number] Soledad Gerlach NIU Diagnosing Phys: Serafina Royals MD  Sonographer Comments: Suboptimal parasternal window and suboptimal apical window. IMPRESSIONS  1. Left ventricular ejection fraction, by estimation, is 40 to 45%. The left ventricle has mildly decreased function. The left ventricle demonstrates global hypokinesis. The left ventricular internal cavity size was mildly dilated. Left ventricular diastolic parameters are consistent with Grade I diastolic dysfunction (impaired relaxation).  2. Right ventricular systolic function is normal. The right ventricular size is normal.  3. The mitral valve is normal in structure. Mild mitral valve regurgitation.  4. The aortic valve is normal in structure. Aortic valve regurgitation is not visualized. FINDINGS  Left Ventricle: Left ventricular ejection fraction, by estimation, is 40 to 45%. The left ventricle has mildly decreased function. The left ventricle demonstrates global hypokinesis. The left ventricular internal cavity size was mildly dilated. There is  no left ventricular hypertrophy. Left ventricular diastolic parameters are consistent with Grade I diastolic dysfunction (impaired relaxation). Right Ventricle: The right ventricular size is normal. No increase in right ventricular wall thickness. Right ventricular systolic function is normal. Left Atrium: Left atrial size was normal in size. Right Atrium: Right atrial size was normal in size. Pericardium: There is no evidence of pericardial effusion. Mitral Valve: The mitral valve is normal in structure. Mild mitral valve regurgitation. MV peak gradient, 5.4 mmHg. The mean mitral valve gradient is 2.0 mmHg. Tricuspid  Valve: The tricuspid valve is normal in structure. Tricuspid valve regurgitation is mild. Aortic Valve: The aortic valve is normal in structure. Aortic valve regurgitation is not visualized. Aortic valve mean gradient measures 1.5 mmHg. Aortic valve peak gradient measures 2.9 mmHg. Aortic valve area, by VTI measures 2.90 cm. Pulmonic Valve: The pulmonic valve was normal in structure. Pulmonic valve regurgitation is not visualized. Aorta: The aortic root and ascending aorta are structurally normal, with no evidence of dilitation. IAS/Shunts: No atrial level shunt detected by color flow Doppler.  LEFT VENTRICLE PLAX 2D LVIDd:         4.40 cm   Diastology LVIDs:         3.20 cm   LV e' medial:    4.24 cm/s LV PW:         1.40 cm   LV E/e' medial:  25.9 LV IVS:        0.95 cm   LV e' lateral:   7.51 cm/s LVOT diam:     2.00 cm   LV E/e' lateral: 14.6 LV SV:         49 LV SV Index:   27 LVOT Area:     3.14 cm  RIGHT VENTRICLE RV Basal diam:  3.90 cm RV S prime:     14.90 cm/s TAPSE (M-mode): 3.0 cm LEFT ATRIUM              Index        RIGHT ATRIUM           Index LA diam:        4.30 cm  2.38 cm/m   RA Area:     19.10 cm LA Vol (A2C):   132.0 ml 72.99 ml/m  RA Volume:   57.40 ml  31.74 ml/m LA Vol (A4C):   61.5 ml  34.01 ml/m LA Biplane Vol: 90.7 ml  50.15 ml/m  AORTIC VALVE                    PULMONIC VALVE AV Area (Vmax):  2.62 cm     PV Vmax:        0.74 m/s AV Area (Vmean):   2.63 cm     PV Vmean:       50.900 cm/s AV Area (VTI):     2.90 cm     PV VTI:         0.148 m AV Vmax:           85.35 cm/s   PV Peak grad:   2.2 mmHg AV Vmean:          56.200 cm/s  PV Mean grad:   1.0 mmHg AV VTI:            0.170 m      RVOT Peak grad: 2 mmHg AV Peak Grad:      2.9 mmHg AV Mean Grad:      1.5 mmHg LVOT Vmax:         71.10 cm/s LVOT Vmean:        47.000 cm/s LVOT VTI:          0.157 m LVOT/AV VTI ratio: 0.92 MITRAL VALVE                TRICUSPID VALVE MV Area (PHT): 4.86 cm     TR Peak grad:   11.8 mmHg MV Area  VTI:   1.57 cm     TR Vmax:        172.00 cm/s MV Peak grad:  5.4 mmHg MV Mean grad:  2.0 mmHg     SHUNTS MV Vmax:       1.16 m/s     Systemic VTI:  0.16 m MV Vmean:      69.4 cm/s    Systemic Diam: 2.00 cm MV Decel Time: 156 msec     Pulmonic VTI:  0.125 m MV E velocity: 110.00 cm/s MV A velocity: 48.80 cm/s MV E/A ratio:  2.25 Serafina Royals MD Electronically signed by Serafina Royals MD Signature Date/Time: 06/13/2021/4:58:08 PM    Final     Medications   Scheduled Meds:  amiodarone  400 mg Oral Daily   apixaban  5 mg Oral BID   aspirin  81 mg Oral Q1200   atorvastatin  80 mg Oral Daily   azithromycin  250 mg Oral Daily   gabapentin  100 mg Oral QHS   ipratropium-albuterol  3 mL Nebulization TID   magnesium oxide  400 mg Oral Daily   melatonin  10 mg Oral QHS   methylPREDNISolone (SOLU-MEDROL) injection  40 mg Intravenous Daily   metoprolol succinate  75 mg Oral BID   mexiletine  150 mg Oral Q8H   mometasone-formoterol  2 puff Inhalation BID   multivitamin with minerals  1 tablet Oral Daily   pantoprazole  40 mg Oral Daily   tamsulosin  0.4 mg Oral Daily   torsemide  20 mg Oral Daily   No recently discontinued medications to reconcile  LOS: 2 days   Richarda Osmond, DO [facility] 06/15/2021, 9:47 AM   Available by Epic secure chat 7AM-7PM. If 7PM-7AM, please contact night-coverage Refer to [email or web address] to contact the [facility] Attending or Consulting provider for this pt

## 2021-06-16 LAB — BASIC METABOLIC PANEL
Anion gap: 8 (ref 5–15)
BUN: 51 mg/dL — ABNORMAL HIGH (ref 8–23)
CO2: 28 mmol/L (ref 22–32)
Calcium: 9.6 mg/dL (ref 8.9–10.3)
Chloride: 102 mmol/L (ref 98–111)
Creatinine, Ser: 2.2 mg/dL — ABNORMAL HIGH (ref 0.61–1.24)
GFR, Estimated: 30 mL/min — ABNORMAL LOW (ref >60)
Glucose, Bld: 143 mg/dL — ABNORMAL HIGH (ref 70–99)
Potassium: 4 mmol/L (ref 3.5–5.1)
Sodium: 138 mmol/L (ref 135–145)

## 2021-06-16 LAB — MRSA NEXT GEN BY PCR, NASAL: MRSA by PCR Next Gen: NOT DETECTED

## 2021-06-16 LAB — EXPECTORATED SPUTUM ASSESSMENT W GRAM STAIN, RFLX TO RESP C

## 2021-06-16 MED ORDER — AZITHROMYCIN 250 MG PO TABS: 250.0000 mg | ORAL_TABLET | Freq: Every day | ORAL | 0 refills | Status: AC

## 2021-06-16 MED ORDER — NEBULIZER MISC: 1.0000 | 0 refills | Status: DC | PRN

## 2021-06-16 MED ORDER — PREDNISONE 20 MG PO TABS
40.0000 mg | ORAL_TABLET | Freq: Every day | ORAL | Status: DC
  Administered 2021-06-16: 10:00:00 40 mg via ORAL
  Filled 2021-06-16: qty 2

## 2021-06-16 MED ORDER — BENZONATATE 100 MG PO CAPS: 100.0000 mg | ORAL_CAPSULE | Freq: Four times a day (QID) | ORAL | 0 refills | Status: DC | PRN

## 2021-06-16 MED ORDER — DM-GUAIFENESIN ER 30-600 MG PO TB12: 1.0000 | ORAL_TABLET | Freq: Two times a day (BID) | ORAL | 0 refills | Status: DC

## 2021-06-16 MED ORDER — PREDNISONE 10 MG PO TABS: ORAL_TABLET | ORAL | 0 refills | Status: DC

## 2021-06-16 MED ORDER — IPRATROPIUM-ALBUTEROL 0.5-2.5 (3) MG/3ML IN SOLN: 3.0000 mL | RESPIRATORY_TRACT | 0 refills | Status: DC | PRN

## 2021-06-16 MED ORDER — TORSEMIDE 20 MG PO TABS: 20.0000 mg | ORAL_TABLET | Freq: Every day | ORAL | 0 refills | Status: DC

## 2021-06-16 NOTE — Progress Notes (Signed)
AVS given to patient, awaiting for ride.

## 2021-06-16 NOTE — Plan of Care (Signed)

## 2021-06-19 NOTE — Discharge Summary (Signed)
Physician Discharge Summary   Patient: Matthew Zamora MRN: [id number] DOB: @DOB   Admit date:     06/13/2021  Discharge date: 06/16/2021  Discharge Physician: Berle Mull   PCP: Maryland Pink, MD   Recommendations at discharge: Follow-up with PCP as recommended.  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 [facility] Course Acute hypoxic respiratory failure- resolved.  stable on room air and no dyspnea on exertion. Even on exertion no oxygen requirement.  COPD exacerbation-  s/p vanc and cefepime in ED, followed by azithromycin. Lung sounds clear. Leaning towards COPD as primary diagnosis given physical exam.  Continue azithromycin and steroids and inhalers.  Acute on chronic systolic CHF (congestive heart failure) ([place]): appears euvolemic on exam. Echo shows EF 40-45% which is improved from previous. Continue current regimen on discharge.  AKI on CKD (chronic kidney disease), stage IIIb:  Slightly worsening than baseline.  Baseline creatinine 1.7-2.0 recently.  Renal function back to baseline.  Monitor.   L ankle fracture s/p fixation-  surgery about 2 months ago, per patient. States that he has been wearing brace consistently in that time and ambulating but having pain and concern that his hardware is protruding from his ankle. Xray showed fracture is stable but screw heads are what is felt protruding on lateral ankle. Still tender to palpation but skin is no longer erythematous since removing brace. Patient is able to ambulate normally - OP follow up  ortho - PT/OT recommend home health.   Paroxysmal atrial fibrillation ([name])- chronic, stable -Amiodarone, Mexitil, metoprolol -Eliquis   Essential hypertension- chronic, stable -Metoprolol   Hyperlipidemia- chronic, stable       -Lipitor   CAD (coronary artery disease) and elevated troponin: Troponin 37, 29.  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   albuterol (2.5 MG/3ML) 0.083% nebulizer solution Commonly known as: PROVENTIL Inhale 3 mLs (2.5 mg total) into the lungs as needed for shortness of breath.   albuterol 108 (90 Base) MCG/ACT inhaler Commonly known as: VENTOLIN HFA Inhale 2 puffs into the lungs every 4 (four) hours as needed.   amiodarone 400 MG tablet Commonly known as: PACERONE Take 1 tablet (400 mg total) by mouth daily.   aspirin 81 MG chewable tablet Chew 81 mg by mouth daily at 12 noon.   atorvastatin 80 MG tablet Commonly known as: LIPITOR Take 80 mg by  mouth daily.   benzonatate 100 MG capsule Commonly known as: Tessalon Perles Take 1 capsule (100 mg total) by mouth every 6 (six) hours as needed for cough.   dextromethorphan-guaiFENesin 30-600 MG 12hr tablet Commonly known as: MUCINEX DM Take 1 tablet by mouth 2 (two) times daily.   Eliquis 5 MG Tabs tablet Generic drug: apixaban Take 5 mg  by mouth 2 (two) times daily.   Fluticasone-Salmeterol 500-50 MCG/DOSE Aepb Commonly known as: ADVAIR Inhale 1 puff into the lungs 2 (two) times daily.   gabapentin 100 MG capsule Commonly known as: NEURONTIN Take 100 mg by mouth at bedtime.   HYDROcodone-acetaminophen 5-325 MG tablet Commonly known as: NORCO/VICODIN Take 1 tablet by mouth every 6 (six) hours as needed for severe pain.   ipratropium-albuterol 0.5-2.5 (3) MG/3ML Soln Commonly known as: DUONEB Take 3 mLs by nebulization every 4 (four) hours as needed. What changed:  when to take this reasons to take this   magnesium chloride 64 MG Tbec SR tablet Commonly known as: SLOW-MAG Take 1 tablet (64 mg total) by mouth daily.   magnesium oxide 400 (240 Mg) MG tablet Commonly known as: MAG-OX Take 1 tablet by mouth daily.   melatonin 3 MG Tabs tablet Take 9 mg by mouth at bedtime.   melatonin 5 MG Tabs Take 0.5 tablets (2.5 mg total) by mouth at bedtime as needed (sleep).   metoprolol succinate 50 MG 24 hr tablet Commonly known as: TOPROL-XL Take 75 mg by mouth 2 (two) times daily.   mexiletine 150 MG capsule Commonly known as: MEXITIL Take 150 mg by mouth every 8 (eight) hours.   multivitamin with minerals Tabs tablet Take 1 tablet by mouth daily.   Nebulizer Misc 1 each by Does not apply route as needed.   omeprazole 20 MG capsule Commonly known as: PRILOSEC Take 20 mg by mouth every morning.   predniSONE 10 MG tablet Commonly known as: DELTASONE Take 40mg  daily for 3days,Take 30mg  daily for 3days,Take 20mg  daily for 3days,Take 10mg  daily for 3days, then stop   tamsulosin 0.4 MG Caps capsule Commonly known as: FLOMAX Take 0.4 mg by mouth daily.   torsemide 20 MG tablet Commonly known as: DEMADEX Take 1 tablet (20 mg total) by mouth daily.       ASK your doctor about these medications    azithromycin 250 MG tablet Commonly known as: ZITHROMAX Take 1 tablet (250 mg total) by mouth daily for  1 day. Ask about: Should I take this medication?        Follow-up Information     Maryland Pink, MD. Schedule an appointment as soon as possible for a visit in 1 week(s).   Specialty: Family Medicine Contact information: [address] [place] [address] [phone]         Rickey Primus, MD. Schedule an appointment as soon as possible for a visit in 2 week(s).   Specialty: Orthopedic Surgery Contact information: [place] [address] [phone]                 Discharge Exam: Danley Danker Weights   06/12/21 1832 06/15/21 0500 06/16/21 0429  Weight: 68 kg 68.9 kg 67.6 kg   General: Appear in mild distress, no Rash; Oral Mucosa Clear, moist. no Abnormal Neck Mass Or lumps, Conjunctiva normal  Cardiovascular: S1 and S2 Present, no Murmur, Respiratory: good respiratory effort, Bilateral Air entry present and CTA, no Crackles, no wheezes Abdomen: Bowel Sound present,  Soft and no tenderness Extremities: no Pedal edema Neurology: alert and oriented to time, place, and person affect appropriate. no new focal deficit Gait not checked due to patient safety concerns  Condition at discharge: good  The results of significant diagnostics from this hospitalization (including imaging, microbiology, ancillary and laboratory) are listed below for reference.   Imaging Studies: DG Chest 2 View  Result Date: 06/12/2021 CLINICAL DATA:  Productive cough. EXAM: CHEST - 2 VIEW COMPARISON:  February 14, 2020 FINDINGS: There is a dual lead AICD with stable lead wire positioning. Very mild atelectatic changes are seen within the bilateral lung bases. There is no evidence of a pleural effusion or pneumothorax. The heart size and mediastinal contours are within normal limits. The visualized skeletal structures are unremarkable. IMPRESSION: Very mild bibasilar atelectasis. Electronically Signed   By: Virgina Norfolk M.D.   On: 06/12/2021 19:20    DG Ankle Complete Left  Result Date: 06/14/2021 CLINICAL DATA:  Recent fracture with surgery about 8 weeks ago. Lateral malleolus is miss aligned with hardware protruding on exam. EXAM: LEFT ANKLE COMPLETE - 3+ VIEW COMPARISON:  None. FINDINGS: No prior postoperative comparison studies are available for correlation. There are postoperative changes with rod and screw fixation of the distal fibula. Two short distal screws in the fibula with 2 long screws fixing the rod to the tibia fibular joint. The upper screw heads may protrude slightly from the bone surface and could be palpable. Two screws fix a fracture of the medial malleolus. Screw heads appear to protrude from the bone surface and could be palpable. Correlation with any prior postoperative studies would be useful to see if there has been any change. A fracture lines remain visible. Near anatomic alignment is demonstrated. Soft tissues are unremarkable. Degenerative changes in the ankle joint. IMPRESSION: Postoperative changes in the left ankle as discussed. Electronically Signed   By: Lucienne Capers M.D.   On: 06/14/2021 15:21   CT CHEST WO CONTRAST  Result Date: 06/13/2021 CLINICAL DATA:  Shortness of breath and cough. EXAM: CT CHEST WITHOUT CONTRAST TECHNIQUE: Multidetector CT imaging of the chest was performed following the standard protocol without IV contrast. COMPARISON:  None. FINDINGS: Cardiovascular: There is a multi lead AICD. There is marked severity calcification of the thoracic aorta, without evidence of aortic aneurysm. Normal heart size with marked severity coronary artery calcification. No pericardial effusion. Mediastinum/Nodes: Mild AP window, pretracheal and right hilar lymphadenopathy is seen. Thyroid gland, trachea, and esophagus demonstrate no significant findings. Lungs/Pleura: Mild areas of atelectasis and/or early infiltrate are seen along the anterior aspects of the bilateral upper lobes and posterior aspect of the  right lower lobe. Small atelectatic changes are also seen scattered throughout both lungs. 7 mm and 8 mm noncalcified posterolateral and posteromedial right lower lobe lung nodules are seen (axial CT images 106 and 136, CT series 3). An 8 mm sub solid lung nodule versus focal scar is noted within the left lower lobe (axial CT image 131, CT series 3). There is no evidence of a pleural effusion or pneumothorax. Upper Abdomen: There is a very small hiatal hernia. 3 mm, 7 mm and 9 mm foci of parenchymal low attenuation are noted within the right lobe of the liver. Small, similar appearing lesions are seen within the left lobe. Musculoskeletal: Multilevel degenerative changes are seen throughout the thoracic spine. IMPRESSION: 1. 7 mm and 8 mm noncalcified right lower lobe lung nodules with an 8 mm left lower lobe subsolid lung nodule versus focal  scar. Non-contrast chest CT at 3-6 months is recommended. If the nodules are stable at time of repeat CT, then future CT at 18-24 months (from today's scan) is considered optional for low-risk patients, but is recommended for high-risk patients. This recommendation follows the consensus statement: Guidelines for Management of Incidental Pulmonary Nodules Detected on CT Images: From the Fleischner Society 2017; Radiology 2017; 284:228-243. 2. Mild bilateral upper lobe and right lower lobe atelectasis and/or early infiltrate. 3. Findings likely consistent with multiple small hepatic cysts and/or hemangiomas. Correlation with nonemergent hepatic ultrasound is recommended. 4. Aortic atherosclerosis. Aortic Atherosclerosis (ICD10-I70.0). Electronically Signed   By: Virgina Norfolk M.D.   On: 06/13/2021 06:05   ECHOCARDIOGRAM COMPLETE  Result Date: 06/13/2021    ECHOCARDIOGRAM REPORT   Patient Name:   EMANUAL LAMOUNTAIN Date of Exam: 06/13/2021 Medical Rec #:  [id number]           Height:       68.0 in Accession #:    [id number]          Weight:       150.0 lb Date of Birth:   1940/06/29           BSA:          1.809 m Patient Age:    81 years            BP:           142/76 mmHg Patient Gender: M                   HR:           73 bpm. Exam Location:  [facility] Procedure: 2D Echo, Cardiac Doppler and Color Doppler Indications:     CHF-acute systolic T41.96  History:         Patient has prior history of Echocardiogram examinations, most                  recent 09/17/2019. CHF; CAD.  Sonographer:     Sherrie Sport Referring Phys:  [id number] Soledad Gerlach NIU Diagnosing Phys: Serafina Royals MD  Sonographer Comments: Suboptimal parasternal window and suboptimal apical window. IMPRESSIONS  1. Left ventricular ejection fraction, by estimation, is 40 to 45%. The left ventricle has mildly decreased function. The left ventricle demonstrates global hypokinesis. The left ventricular internal cavity size was mildly dilated. Left ventricular diastolic parameters are consistent with Grade I diastolic dysfunction (impaired relaxation).  2. Right ventricular systolic function is normal. The right ventricular size is normal.  3. The mitral valve is normal in structure. Mild mitral valve regurgitation.  4. The aortic valve is normal in structure. Aortic valve regurgitation is not visualized. FINDINGS  Left Ventricle: Left ventricular ejection fraction, by estimation, is 40 to 45%. The left ventricle has mildly decreased function. The left ventricle demonstrates global hypokinesis. The left ventricular internal cavity size was mildly dilated. There is  no left ventricular hypertrophy. Left ventricular diastolic parameters are consistent with Grade I diastolic dysfunction (impaired relaxation). Right Ventricle: The right ventricular size is normal. No increase in right ventricular wall thickness. Right ventricular systolic function is normal. Left Atrium: Left atrial size was normal in size. Right Atrium: Right atrial size was normal in size. Pericardium: There is no evidence of pericardial effusion. Mitral Valve: The mitral  valve is normal in structure. Mild mitral valve regurgitation. MV peak gradient, 5.4 mmHg. The mean mitral valve gradient is 2.0 mmHg. Tricuspid Valve: The tricuspid valve is normal in structure.  Tricuspid valve regurgitation is mild. Aortic Valve: The aortic valve is normal in structure. Aortic valve regurgitation is not visualized. Aortic valve mean gradient measures 1.5 mmHg. Aortic valve peak gradient measures 2.9 mmHg. Aortic valve area, by VTI measures 2.90 cm. Pulmonic Valve: The pulmonic valve was normal in structure. Pulmonic valve regurgitation is not visualized. Aorta: The aortic root and ascending aorta are structurally normal, with no evidence of dilitation. IAS/Shunts: No atrial level shunt detected by color flow Doppler.  LEFT VENTRICLE PLAX 2D LVIDd:         4.40 cm   Diastology LVIDs:         3.20 cm   LV e' medial:    4.24 cm/s LV PW:         1.40 cm   LV E/e' medial:  25.9 LV IVS:        0.95 cm   LV e' lateral:   7.51 cm/s LVOT diam:     2.00 cm   LV E/e' lateral: 14.6 LV SV:         49 LV SV Index:   27 LVOT Area:     3.14 cm  RIGHT VENTRICLE RV Basal diam:  3.90 cm RV S prime:     14.90 cm/s TAPSE (M-mode): 3.0 cm LEFT ATRIUM              Index        RIGHT ATRIUM           Index LA diam:        4.30 cm  2.38 cm/m   RA Area:     19.10 cm LA Vol (A2C):   132.0 ml 72.99 ml/m  RA Volume:   57.40 ml  31.74 ml/m LA Vol (A4C):   61.5 ml  34.01 ml/m LA Biplane Vol: 90.7 ml  50.15 ml/m  AORTIC VALVE                    PULMONIC VALVE AV Area (Vmax):    2.62 cm     PV Vmax:        0.74 m/s AV Area (Vmean):   2.63 cm     PV Vmean:       50.900 cm/s AV Area (VTI):     2.90 cm     PV VTI:         0.148 m AV Vmax:           85.35 cm/s   PV Peak grad:   2.2 mmHg AV Vmean:          56.200 cm/s  PV Mean grad:   1.0 mmHg AV VTI:            0.170 m      RVOT Peak grad: 2 mmHg AV Peak Grad:      2.9 mmHg AV Mean Grad:      1.5 mmHg LVOT Vmax:         71.10 cm/s LVOT Vmean:        47.000 cm/s LVOT VTI:           0.157 m LVOT/AV VTI ratio: 0.92 MITRAL VALVE                TRICUSPID VALVE MV Area (PHT): 4.86 cm     TR Peak grad:   11.8 mmHg MV Area VTI:   1.57 cm     TR Vmax:        172.00 cm/s MV  Peak grad:  5.4 mmHg MV Mean grad:  2.0 mmHg     SHUNTS MV Vmax:       1.16 m/s     Systemic VTI:  0.16 m MV Vmean:      69.4 cm/s    Systemic Diam: 2.00 cm MV Decel Time: 156 msec     Pulmonic VTI:  0.125 m MV E velocity: 110.00 cm/s MV A velocity: 48.80 cm/s MV E/A ratio:  2.25 Serafina Royals MD Electronically signed by Serafina Royals MD Signature Date/Time: 06/13/2021/4:58:08 PM    Final     Microbiology: Results for orders placed or performed during the hospital encounter of 06/13/21  Resp Panel by RT-PCR (Flu A&B, Covid) Nasopharyngeal Swab     Status: None   Collection Time: 06/13/21  2:33 AM   Specimen: Nasopharyngeal Swab; Nasopharyngeal(NP) swabs in vial transport medium  Result Value Ref Range Status   SARS Coronavirus 2 by RT PCR NEGATIVE NEGATIVE Final    Comment: (NOTE) SARS-CoV-2 target nucleic acids are NOT DETECTED.  The SARS-CoV-2 RNA is generally detectable in upper respiratory specimens during the acute phase of infection. The lowest concentration of SARS-CoV-2 viral copies this assay can detect is 138 copies/mL. A negative result does not preclude SARS-Cov-2 infection and should not be used as the sole basis for treatment or other patient management decisions. A negative result may occur with  improper specimen collection/handling, submission of specimen other than nasopharyngeal swab, presence of viral mutation(s) within the areas targeted by this assay, and inadequate number of viral copies(<138 copies/mL). A negative result must be combined with clinical observations, patient history, and epidemiological information. The expected result is Negative.  Fact Sheet for Patients:  EntrepreneurPulse.com.au  Fact Sheet for Healthcare Providers:   IncredibleEmployment.be  This test is no t yet approved or cleared by the [place] FDA and  has been authorized for detection and/or diagnosis of SARS-CoV-2 by FDA under an Emergency Use Authorization (EUA). This EUA will remain  in effect (meaning this test can be used) for the duration of the COVID-19 declaration under Section 564(b)(1) of the Act, 21 U.S.C.section 360bbb-3(b)(1), unless the authorization is terminated  or revoked sooner.       Influenza A by PCR NEGATIVE NEGATIVE Final   Influenza B by PCR NEGATIVE NEGATIVE Final    Comment: (NOTE) The Xpert Xpress SARS-CoV-2/FLU/RSV plus assay is intended as an aid in the diagnosis of influenza from Nasopharyngeal swab specimens and should not be used as a sole basis for treatment. Nasal washings and aspirates are unacceptable for Xpert Xpress SARS-CoV-2/FLU/RSV testing.  Fact Sheet for Patients: EntrepreneurPulse.com.au  Fact Sheet for Healthcare Providers: IncredibleEmployment.be  This test is not yet approved or cleared by the [place] FDA and has been authorized for detection and/or diagnosis of SARS-CoV-2 by FDA under an Emergency Use Authorization (EUA). This EUA will remain in effect (meaning this test can be used) for the duration of the COVID-19 declaration under Section 564(b)(1) of the Act, 21 U.S.C. section 360bbb-3(b)(1), unless the authorization is terminated or revoked.  Performed at [facility], [place], [place] [address]   Expectorated Sputum Assessment w Gram Stain, Rflx to Resp Cult     Status: None   Collection Time: 06/16/21 11:00 AM   Specimen: Sputum  Result Value Ref Range Status   Specimen Description SPUTUM  Final   Special Requests NONE  Final   Sputum evaluation   Final    Sputum specimen not  acceptable for testing.  Please recollect.   C/GERALYN RATILLA AT [id number] 06/16/21.PMF Performed at [facility], [address], [place], [place] [address]    Report Status 06/16/2021 FINAL  Final  MRSA Next Gen by PCR, Nasal     Status: None   Collection Time: 06/16/21 11:00 AM   Specimen: Nasal Mucosa; Nasal Swab  Result Value Ref Range Status   MRSA by PCR Next Gen NOT DETECTED NOT DETECTED Final    Comment: (NOTE) The GeneXpert MRSA Assay (FDA approved for NASAL specimens only), is one component of a comprehensive MRSA colonization surveillance program. It is not intended to diagnose MRSA infection nor to guide or monitor treatment for MRSA infections. Test performance is not FDA approved in patients less than 82 years old. Performed at [facility], [address] [place], [place] [address]     Labs: CBC: Recent Labs  Lab 06/12/21 1841  WBC 13.2*  HGB 11.5*  HCT 35.6*  MCV 96.7  PLT 446   Basic Metabolic Panel: Recent Labs  Lab 06/12/21 1841 06/14/21 0443 06/15/21 0810 06/16/21 0544  NA 133* 135 137 138  K 4.1 4.2 4.0 4.0  CL 100 101 102 102  CO2 24 25 26 28   GLUCOSE 102* 176* 156* 143*  BUN 30* 40* 46* 51*  CREATININE 2.25* 2.35* 2.36* 2.20*  CALCIUM 9.5 9.5 9.7 9.6  MG  --  2.1  --   --    Liver Function Tests: No results for input(s): AST, ALT, ALKPHOS, BILITOT, PROT, ALBUMIN in the last 168 hours. CBG: No results for input(s): GLUCAP in the last 168 hours.  Discharge time spent: greater than 30 minutes.  Signed:  Berle Mull MD.  [facility]

## 2021-07-13 ENCOUNTER — Emergency Department: Payer: Medicare Other

## 2021-07-13 ENCOUNTER — Emergency Department
Admission: EM | Admit: 2021-07-13 | Discharge: 2021-07-13 | Disposition: A | Discharge status: Home / Self Care | Payer: Medicare Other | Attending: Emergency Medicine | Admitting: Emergency Medicine

## 2021-07-13 ENCOUNTER — Other Ambulatory Visit: Payer: Self-pay

## 2021-07-13 DIAGNOSIS — R040 Epistaxis: Secondary | ICD-10-CM | POA: Diagnosis not present

## 2021-07-13 DIAGNOSIS — E860 Dehydration: Secondary | ICD-10-CM | POA: Diagnosis not present

## 2021-07-13 DIAGNOSIS — R42 Dizziness and giddiness: Secondary | ICD-10-CM | POA: Diagnosis present

## 2021-07-13 DIAGNOSIS — I480 Paroxysmal atrial fibrillation: Secondary | ICD-10-CM | POA: Diagnosis not present

## 2021-07-13 DIAGNOSIS — Z7901 Long term (current) use of anticoagulants: Secondary | ICD-10-CM | POA: Insufficient documentation

## 2021-07-13 DIAGNOSIS — D649 Anemia, unspecified: Secondary | ICD-10-CM | POA: Diagnosis not present

## 2021-07-13 LAB — BASIC METABOLIC PANEL
Anion gap: 11 (ref 5–15)
BUN: 23 mg/dL (ref 8–23)
CO2: 24 mmol/L (ref 22–32)
Calcium: 9.2 mg/dL (ref 8.9–10.3)
Chloride: 97 mmol/L — ABNORMAL LOW (ref 98–111)
Creatinine, Ser: 2.14 mg/dL — ABNORMAL HIGH (ref 0.61–1.24)
GFR, Estimated: 31 mL/min — ABNORMAL LOW (ref >60)
Glucose, Bld: 122 mg/dL — ABNORMAL HIGH (ref 70–99)
Potassium: 3.7 mmol/L (ref 3.5–5.1)
Sodium: 132 mmol/L — ABNORMAL LOW (ref 135–145)

## 2021-07-13 LAB — CBC
HCT: 31.3 % — ABNORMAL LOW (ref 39.0–52.0)
Hemoglobin: 9.9 g/dL — ABNORMAL LOW (ref 13.0–17.0)
MCH: 31.1 pg (ref 26.0–34.0)
MCHC: 31.6 g/dL (ref 30.0–36.0)
MCV: 98.4 fL (ref 80.0–100.0)
Platelets: 174 10*3/uL (ref 150–400)
RBC: 3.18 MIL/uL — ABNORMAL LOW (ref 4.22–5.81)
RDW: 14 % (ref 11.5–15.5)
WBC: 4.8 10*3/uL (ref 4.0–10.5)
nRBC: 0 % (ref 0.0–0.2)

## 2021-07-13 MED ORDER — OXYMETAZOLINE HCL 0.05 % NA SOLN: 2.0000 | Freq: Two times a day (BID) | NASAL | 1 refills | Status: DC | PRN

## 2021-07-13 NOTE — Discharge Instructions (Addendum)
Please start using some electrolyte solution (Gatorade, Powerade, Pedialyte) mixed with water and a one-to-one ratio every day instead of just plain water. Please use this Afrin nasal spray as directed for any recurrent nosebleeds

## 2021-07-13 NOTE — ED Triage Notes (Signed)
Pt comes into the ED Via [facility] from home c/o epistaxis.  Pt states he started feeling weak first and then his nose started bleeding afterwards.  Pt kept falling asleep in the ambulance and the patient states that is not normal for him.  Pt recently discharged from hospital admission for pneumonia.  Pt is on blood thinning medicines.   117/94 137 CBG 97% RA 86 HR

## 2021-07-13 NOTE — ED Notes (Signed)
Pt verbalized understanding of discharge.

## 2021-07-13 NOTE — ED Provider Notes (Signed)
[facility] Provider Note    Event Date/Time   First MD Initiated Contact with Patient 07/13/21 1936     (approximate)   History   Dizziness   HPI Matthew Zamora is a 81 y.o. male with a stated past medical history of paroxysmal atrial fibrillation on Eliquis who presents for dizziness and recurrent nasal bleeding.  Patient states that earlier today he had an episode of lightheadedness when he stood up and began walking.  Patient states that he held onto a railing in his house before any falls but felt his lower extremities becoming weak.  Patient states that approximately 1 hour after these symptoms he began having some nosebleeding as well and called EMS.  When EMS arrived they administered 2 sprays of Afrin in each nostril with resolution within approximately 30 minutes.  Patient states that throughout his emergency department course the symptoms have steadily resolved and he denies any complaints at this time.     Physical Exam   Triage Vital Signs: ED Triage Vitals [07/13/21 1540]  Enc Vitals Group     BP 99/64     Pulse Rate 62     Resp 20     Temp 98 F (36.7 C)     Temp src      SpO2 99 %     Weight 150 lb (68 kg)     Height 5\' 8"  (1.727 m)     Head Circumference      Peak Flow      Pain Score 0     Pain Loc      Pain Edu?      Excl. in [place]?     Most recent vital signs: Vitals:   07/13/21 1540  BP: 99/64  Pulse: 62  Resp: 20  Temp: 98 F (36.7 C)  SpO2: 99%    General: Awake, oriented x4 CV:  Good peripheral perfusion.  Resp:  Normal effort.  Abd:  No distention.  Other:  Well-appearing elderly Caucasian male sitting in bed in no distress with small amount of dried blood in the left nare.   ED Results / Procedures / Treatments   Labs (all labs ordered are listed, but only abnormal results are displayed) Labs Reviewed  CBC - Abnormal; Notable for the following components:      Result Value   RBC 3.18 (*)     Hemoglobin 9.9 (*)    HCT 31.3 (*)    All other components within normal limits  BASIC METABOLIC PANEL - Abnormal; Notable for the following components:   Sodium 132 (*)    Chloride 97 (*)    Glucose, Bld 122 (*)    Creatinine, Ser 2.14 (*)    GFR, Estimated 31 (*)    All other components within normal limits     EKG ED ECG REPORT I, Naaman Plummer, the attending physician, personally viewed and interpreted this ECG.  Date: 07/13/2021 EKG Time: 1542 Rate: 61 Rhythm: Atrially paced rhythm QRS Axis: normal Intervals: normal ST/T Wave abnormalities: normal Narrative Interpretation: Atrially paced rhythm.  No evidence of acute ischemia   RADIOLOGY ED MD interpretation: 2 view chest x-ray shows no evidence of acute abnormalities including no pneumonia, pneumothorax, or widened mediastinum.  Agree with radiology assessment  Official radiology report(s): DG Chest 2 View  Result Date: 07/13/2021 CLINICAL DATA:  Weakness, epistaxis, somnolent, recent pneumonia EXAM: CHEST - 2 VIEW COMPARISON:  06/12/2021 FINDINGS: Frontal and lateral views of the chest  demonstrate stable dual lead pacer/AICD. Cardiac silhouette is unremarkable. No acute airspace disease, effusion, or pneumothorax. There are no acute bony abnormalities. IMPRESSION: 1. No acute intrathoracic process. Electronically Signed   By: Randa Ngo M.D.   On: 07/13/2021 16:13      PROCEDURES:  Critical Care performed: No  .1-3 Lead EKG Interpretation Performed by: Naaman Plummer, MD Authorized by: Naaman Plummer, MD     Interpretation: normal     ECG rate:  61   ECG rate assessment: normal     Rhythm: sinus rhythm     Ectopy: none     Conduction: normal     MEDICATIONS ORDERED IN ED: Medications - No data to display   IMPRESSION / MDM / [place] / ED COURSE  I reviewed the triage vital signs and the nursing notes.                              Differential diagnosis includes, but is not  limited to, symptomatic anemia, GI bleeding, recurrent epistaxis, arrhythmia, ACS  The patient is on the cardiac monitor to evaluate for evidence of arrhythmia and/or significant heart rate changes.  Patient is a 81 year old male who presents secondary to episodes of dizziness and recurrent epistaxis while on Eliquis for paroxysmal atrial fibrillation.  Patient's epistaxis was only for approximately 1 hours duration and resolved after Afrin administration.  Patient states that his symptoms of lightheadedness have also decreased as he has been drinking water throughout the day during his stay in our emergency department.  Patient was informed about a decreasing hemoglobin from 11.5-9.9 over the last month and states that this occurs occasionally for him and will follow-up with his primary care physician.  Patient also shows signs of of chronic kidney disease with creatinine of 2.14 that is at his baseline of approximately 2.  Patient's chest x-ray did not show any evidence of acute abnormalities and EKG is unchanged from previous with atrially paced rhythm.  The patient has been reexamined and is ready to be discharged.  All diagnostic results have been reviewed and discussed with the patient/family.  Care plan has been outlined and the patient/family understands all current diagnoses, results, and treatment plans.  There are no new complaints, changes, or physical findings at this time.  All questions have been addressed and answered.  All medications, if any, that were given while in the emergency department or any that are being prescribed have been reviewed with the patient/family.  All side effects and adverse reactions have been explained.  Patient was instructed to, and agrees to follow-up with their primary care physician as well as return to the emergency department if any new or worsening symptoms develop.        FINAL CLINICAL IMPRESSION(S) / ED DIAGNOSES   Final diagnoses:  Dehydration   Anemia, unspecified type  Recurrent epistaxis     Rx / DC Orders   ED Discharge Orders          Ordered    oxymetazoline (AFRIN) 0.05 % nasal spray  2 times daily PRN        07/13/21 1955             Note:  This document was prepared using Dragon voice recognition software and may include unintentional dictation errors.   Naaman Plummer, MD 07/13/21 2001

## 2021-07-13 NOTE — ED Triage Notes (Signed)
Pt to ED For dizziness and nose bleed that started today. No bleeding at this time.

## 2021-08-10 ENCOUNTER — Inpatient Hospital Stay
Admission: EM | Admit: 2021-08-10 | Discharge: 2021-08-12 | DRG: 291 | Disposition: A | Discharge status: Home / Self Care | Payer: Medicare Other | Attending: Internal Medicine | Admitting: Internal Medicine

## 2021-08-10 ENCOUNTER — Emergency Department: Payer: Medicare Other

## 2021-08-10 ENCOUNTER — Inpatient Hospital Stay
Admit: 2021-08-10 | Discharge: 2021-08-10 | Disposition: A | Discharge status: Home / Self Care | Payer: Medicare Other | Attending: Internal Medicine | Admitting: Internal Medicine

## 2021-08-10 ENCOUNTER — Other Ambulatory Visit: Payer: Self-pay

## 2021-08-10 DIAGNOSIS — Z8546 Personal history of malignant neoplasm of prostate: Secondary | ICD-10-CM | POA: Diagnosis not present

## 2021-08-10 DIAGNOSIS — J441 Chronic obstructive pulmonary disease with (acute) exacerbation: Secondary | ICD-10-CM | POA: Diagnosis present

## 2021-08-10 DIAGNOSIS — I2510 Atherosclerotic heart disease of native coronary artery without angina pectoris: Secondary | ICD-10-CM | POA: Diagnosis present

## 2021-08-10 DIAGNOSIS — E785 Hyperlipidemia, unspecified: Secondary | ICD-10-CM | POA: Diagnosis present

## 2021-08-10 DIAGNOSIS — Z79899 Other long term (current) drug therapy: Secondary | ICD-10-CM

## 2021-08-10 DIAGNOSIS — Z9981 Dependence on supplemental oxygen: Secondary | ICD-10-CM

## 2021-08-10 DIAGNOSIS — I130 Hypertensive heart and chronic kidney disease with heart failure and stage 1 through stage 4 chronic kidney disease, or unspecified chronic kidney disease: Principal | ICD-10-CM | POA: Diagnosis present

## 2021-08-10 DIAGNOSIS — K219 Gastro-esophageal reflux disease without esophagitis: Secondary | ICD-10-CM | POA: Diagnosis present

## 2021-08-10 DIAGNOSIS — Z8249 Family history of ischemic heart disease and other diseases of the circulatory system: Secondary | ICD-10-CM

## 2021-08-10 DIAGNOSIS — I714 Abdominal aortic aneurysm, without rupture, unspecified: Secondary | ICD-10-CM | POA: Diagnosis present

## 2021-08-10 DIAGNOSIS — Z7951 Long term (current) use of inhaled steroids: Secondary | ICD-10-CM | POA: Diagnosis not present

## 2021-08-10 DIAGNOSIS — N1832 Chronic kidney disease, stage 3b: Secondary | ICD-10-CM | POA: Diagnosis present

## 2021-08-10 DIAGNOSIS — J9621 Acute and chronic respiratory failure with hypoxia: Secondary | ICD-10-CM | POA: Diagnosis present

## 2021-08-10 DIAGNOSIS — I10 Essential (primary) hypertension: Secondary | ICD-10-CM | POA: Diagnosis present

## 2021-08-10 DIAGNOSIS — Z7901 Long term (current) use of anticoagulants: Secondary | ICD-10-CM | POA: Diagnosis not present

## 2021-08-10 DIAGNOSIS — Z7982 Long term (current) use of aspirin: Secondary | ICD-10-CM

## 2021-08-10 DIAGNOSIS — Z20822 Contact with and (suspected) exposure to covid-19: Secondary | ICD-10-CM | POA: Diagnosis present

## 2021-08-10 DIAGNOSIS — Z95810 Presence of automatic (implantable) cardiac defibrillator: Secondary | ICD-10-CM | POA: Diagnosis not present

## 2021-08-10 DIAGNOSIS — J9601 Acute respiratory failure with hypoxia: Secondary | ICD-10-CM

## 2021-08-10 DIAGNOSIS — N183 Chronic kidney disease, stage 3 unspecified: Secondary | ICD-10-CM | POA: Diagnosis present

## 2021-08-10 DIAGNOSIS — I5043 Acute on chronic combined systolic (congestive) and diastolic (congestive) heart failure: Secondary | ICD-10-CM | POA: Diagnosis present

## 2021-08-10 DIAGNOSIS — I480 Paroxysmal atrial fibrillation: Secondary | ICD-10-CM | POA: Diagnosis present

## 2021-08-10 DIAGNOSIS — R0602 Shortness of breath: Secondary | ICD-10-CM | POA: Diagnosis present

## 2021-08-10 DIAGNOSIS — Z87891 Personal history of nicotine dependence: Secondary | ICD-10-CM

## 2021-08-10 DIAGNOSIS — I509 Heart failure, unspecified: Secondary | ICD-10-CM

## 2021-08-10 LAB — ECHOCARDIOGRAM COMPLETE
Area-P 1/2: 3.85 cm2
Height: 68 in
S' Lateral: 3.9 cm
Weight: 2398.6 oz

## 2021-08-10 LAB — CBC
HCT: 31.4 % — ABNORMAL LOW (ref 39.0–52.0)
Hemoglobin: 9.9 g/dL — ABNORMAL LOW (ref 13.0–17.0)
MCH: 30.2 pg (ref 26.0–34.0)
MCHC: 31.5 g/dL (ref 30.0–36.0)
MCV: 95.7 fL (ref 80.0–100.0)
Platelets: 171 10*3/uL (ref 150–400)
RBC: 3.28 MIL/uL — ABNORMAL LOW (ref 4.22–5.81)
RDW: 14.8 % (ref 11.5–15.5)
WBC: 6.7 10*3/uL (ref 4.0–10.5)
nRBC: 0 % (ref 0.0–0.2)

## 2021-08-10 LAB — BASIC METABOLIC PANEL
Anion gap: 4 — ABNORMAL LOW (ref 5–15)
BUN: 19 mg/dL (ref 8–23)
CO2: 25 mmol/L (ref 22–32)
Calcium: 9 mg/dL (ref 8.9–10.3)
Chloride: 105 mmol/L (ref 98–111)
Creatinine, Ser: 2.07 mg/dL — ABNORMAL HIGH (ref 0.61–1.24)
GFR, Estimated: 32 mL/min — ABNORMAL LOW (ref >60)
Glucose, Bld: 107 mg/dL — ABNORMAL HIGH (ref 70–99)
Potassium: 4.3 mmol/L (ref 3.5–5.1)
Sodium: 134 mmol/L — ABNORMAL LOW (ref 135–145)

## 2021-08-10 LAB — TROPONIN I (HIGH SENSITIVITY)
Troponin I (High Sensitivity): 14 ng/L (ref <18)
Troponin I (High Sensitivity): 15 ng/L (ref <18)

## 2021-08-10 LAB — RESP PANEL BY RT-PCR (FLU A&B, COVID) ARPGX2
Influenza A by PCR: NEGATIVE
Influenza B by PCR: NEGATIVE
SARS Coronavirus 2 by RT PCR: NEGATIVE

## 2021-08-10 LAB — BRAIN NATRIURETIC PEPTIDE: B Natriuretic Peptide: 411.3 pg/mL — ABNORMAL HIGH (ref 0.0–100.0)

## 2021-08-10 LAB — MAGNESIUM: Magnesium: 2 mg/dL (ref 1.7–2.4)

## 2021-08-10 MED ORDER — GABAPENTIN 100 MG PO CAPS
100.0000 mg | ORAL_CAPSULE | Freq: Every day | ORAL | Status: DC
  Administered 2021-08-10 – 2021-08-11 (×2): 100 mg via ORAL
  Filled 2021-08-10 (×2): qty 1

## 2021-08-10 MED ORDER — MELATONIN 5 MG PO TABS
10.0000 mg | ORAL_TABLET | Freq: Every day | ORAL | Status: DC
  Administered 2021-08-10 – 2021-08-11 (×2): 10 mg via ORAL
  Filled 2021-08-10 (×2): qty 2

## 2021-08-10 MED ORDER — ACETAMINOPHEN 325 MG PO TABS: 650.0000 mg | ORAL_TABLET | Freq: Four times a day (QID) | ORAL | Status: DC | PRN

## 2021-08-10 MED ORDER — METHYLPREDNISOLONE SODIUM SUCC 40 MG IJ SOLR
40.0000 mg | Freq: Two times a day (BID) | INTRAMUSCULAR | Status: DC
  Administered 2021-08-10 – 2021-08-12 (×4): 40 mg via INTRAVENOUS
  Filled 2021-08-10 (×4): qty 1

## 2021-08-10 MED ORDER — ADULT MULTIVITAMIN W/MINERALS CH
1.0000 | ORAL_TABLET | Freq: Every day | ORAL | Status: DC
  Administered 2021-08-10 – 2021-08-12 (×3): 1 via ORAL
  Filled 2021-08-10 (×3): qty 1

## 2021-08-10 MED ORDER — DM-GUAIFENESIN ER 30-600 MG PO TB12
1.0000 | ORAL_TABLET | Freq: Two times a day (BID) | ORAL | Status: DC | PRN
Start: 2021-08-10 — End: 2021-08-12

## 2021-08-10 MED ORDER — ATORVASTATIN CALCIUM 80 MG PO TABS
80.0000 mg | ORAL_TABLET | Freq: Every day | ORAL | Status: DC
Start: 2021-08-10 — End: 2021-08-12
  Administered 2021-08-10 – 2021-08-11 (×2): 80 mg via ORAL
  Filled 2021-08-10: qty 1
  Filled 2021-08-10: qty 4

## 2021-08-10 MED ORDER — IPRATROPIUM-ALBUTEROL 0.5-2.5 (3) MG/3ML IN SOLN
3.0000 mL | Freq: Once | RESPIRATORY_TRACT | Status: AC
  Administered 2021-08-10: 3 mL via RESPIRATORY_TRACT
  Filled 2021-08-10: qty 3

## 2021-08-10 MED ORDER — METHYLPREDNISOLONE SODIUM SUCC 125 MG IJ SOLR
125.0000 mg | Freq: Once | INTRAMUSCULAR | Status: AC
Start: 2021-08-10 — End: 2021-08-10
  Administered 2021-08-10: 125 mg via INTRAVENOUS
  Filled 2021-08-10: qty 2

## 2021-08-10 MED ORDER — METOPROLOL SUCCINATE ER 50 MG PO TB24
75.0000 mg | ORAL_TABLET | Freq: Two times a day (BID) | ORAL | Status: DC
  Administered 2021-08-10 – 2021-08-12 (×5): 75 mg via ORAL
  Filled 2021-08-10: qty 2
  Filled 2021-08-10: qty 1
  Filled 2021-08-10 (×2): qty 2
  Filled 2021-08-10: qty 1

## 2021-08-10 MED ORDER — MOMETASONE FURO-FORMOTEROL FUM 200-5 MCG/ACT IN AERO
2.0000 | INHALATION_SPRAY | Freq: Two times a day (BID) | RESPIRATORY_TRACT | Status: DC
  Administered 2021-08-11 – 2021-08-12 (×2): 2 via RESPIRATORY_TRACT
  Filled 2021-08-10 (×2): qty 8.8

## 2021-08-10 MED ORDER — IPRATROPIUM-ALBUTEROL 0.5-2.5 (3) MG/3ML IN SOLN
3.0000 mL | RESPIRATORY_TRACT | Status: DC
  Administered 2021-08-10 – 2021-08-11 (×7): 3 mL via RESPIRATORY_TRACT
  Filled 2021-08-10 (×7): qty 3

## 2021-08-10 MED ORDER — ACETAMINOPHEN 650 MG RE SUPP: 650.0000 mg | Freq: Four times a day (QID) | RECTAL | Status: DC | PRN

## 2021-08-10 MED ORDER — AMIODARONE HCL 200 MG PO TABS
400.0000 mg | ORAL_TABLET | Freq: Every day | ORAL | Status: DC
  Administered 2021-08-10 – 2021-08-12 (×3): 400 mg via ORAL
  Filled 2021-08-10 (×3): qty 2

## 2021-08-10 MED ORDER — MAGNESIUM OXIDE -MG SUPPLEMENT 400 (240 MG) MG PO TABS
400.0000 mg | ORAL_TABLET | Freq: Every day | ORAL | Status: DC
  Administered 2021-08-10 – 2021-08-12 (×3): 400 mg via ORAL
  Filled 2021-08-10 (×3): qty 1

## 2021-08-10 MED ORDER — APIXABAN 5 MG PO TABS
5.0000 mg | ORAL_TABLET | Freq: Two times a day (BID) | ORAL | Status: DC
  Administered 2021-08-10 – 2021-08-12 (×5): 5 mg via ORAL
  Filled 2021-08-10 (×5): qty 1

## 2021-08-10 MED ORDER — TAMSULOSIN HCL 0.4 MG PO CAPS
0.4000 mg | ORAL_CAPSULE | Freq: Every day | ORAL | Status: DC
Start: 2021-08-10 — End: 2021-08-12
  Administered 2021-08-10 – 2021-08-12 (×3): 0.4 mg via ORAL
  Filled 2021-08-10 (×3): qty 1

## 2021-08-10 MED ORDER — FUROSEMIDE 10 MG/ML IJ SOLN
40.0000 mg | Freq: Two times a day (BID) | INTRAMUSCULAR | Status: DC
  Administered 2021-08-10 – 2021-08-12 (×4): 40 mg via INTRAVENOUS
  Filled 2021-08-10 (×4): qty 4

## 2021-08-10 MED ORDER — MEXILETINE HCL 150 MG PO CAPS
150.0000 mg | ORAL_CAPSULE | Freq: Three times a day (TID) | ORAL | Status: DC
  Administered 2021-08-10 – 2021-08-12 (×6): 150 mg via ORAL
  Filled 2021-08-10 (×8): qty 1

## 2021-08-10 MED ORDER — FUROSEMIDE 10 MG/ML IJ SOLN
40.0000 mg | Freq: Once | INTRAMUSCULAR | Status: AC
  Administered 2021-08-10: 40 mg via INTRAVENOUS
  Filled 2021-08-10: qty 4

## 2021-08-10 MED ORDER — ASPIRIN 81 MG PO CHEW
81.0000 mg | CHEWABLE_TABLET | Freq: Every day | ORAL | Status: DC
  Administered 2021-08-10 – 2021-08-12 (×3): 81 mg via ORAL
  Filled 2021-08-10 (×3): qty 1

## 2021-08-10 MED ORDER — ALBUTEROL SULFATE (2.5 MG/3ML) 0.083% IN NEBU
2.5000 mg | INHALATION_SOLUTION | RESPIRATORY_TRACT | Status: DC | PRN
Start: 2021-08-10 — End: 2021-08-12

## 2021-08-10 MED ORDER — PANTOPRAZOLE SODIUM 40 MG PO TBEC
40.0000 mg | DELAYED_RELEASE_TABLET | Freq: Every day | ORAL | Status: DC
Start: 2021-08-10 — End: 2021-08-12
  Administered 2021-08-10 – 2021-08-12 (×3): 40 mg via ORAL
  Filled 2021-08-10 (×3): qty 1

## 2021-08-10 NOTE — ED Provider Notes (Signed)
[facility] Provider Note    Event Date/Time   First MD Initiated Contact with Patient 08/10/21 [phone]     (approximate)   History   Shortness of Breath   HPI  Matthew Zamora is a 81 y.o. male with history of CAD, CHF status post pacemaker, paroxysmal atrial fibrillation on Eliquis, hypertension, hyperlipidemia who wears oxygen as needed who presents to the emergency department shortness of breath that started about an hour ago.  He has been wheezing.  He denies a history of asthma or COPD.  No lower extremity swelling or pain.  No history of PE or DVT.  He denies any chest discomfort.  No fevers or cough.  Currently on 4 L nasal cannula.   History provided by patient and EMS.    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 pylori infection    History of hepatitis    Hyperlipidemia    Hypertension    Liver cyst 05/16/15   PAF (paroxysmal atrial fibrillation) ([place])    Prostate [facility])     Past Surgical History:  Procedure Laterality Date   CATARACT EXTRACTION     COLONOSCOPY  10/06/2014, 09/18/2004, 03/14/2009   ESOPHAGOGASTRODUODENOSCOPY  10/06/2014, 03/18/2001, 03/14/2009   ESOPHAGOGASTRODUODENOSCOPY (EGD) WITH PROPOFOL N/A 05/07/2018   Procedure: ESOPHAGOGASTRODUODENOSCOPY (EGD) WITH PROPOFOL;  Surgeon: Toledo, [name], MD;  Location: [facility] ENDOSCOPY;  Service: Gastroenterology;  Laterality: N/A;   FLEXIBLE SIGMOIDOSCOPY  08/26/1990   INSERTION OF ICD     PROSTATE SURGERY     TONSILLECTOMY      MEDICATIONS:  Prior to Admission medications   Medication Sig Start Date End Date Taking? Authorizing Provider   acetaminophen (TYLENOL) 325 MG tablet Take 2 tablets (650 mg total) by mouth every 4 (four) hours as needed for headache or mild pain. 10/29/19   Bradly Bienenstock, NP  albuterol (PROVENTIL) (2.5 MG/3ML) 0.083% nebulizer solution Inhale 3 mLs (2.5 mg total) into the lungs as needed for shortness of breath. 10/29/19   Bradly Bienenstock, NP  albuterol (VENTOLIN HFA) 108 (90 Base) MCG/ACT inhaler Inhale 2 puffs into the lungs every 4 (four) hours as needed. 04/21/21   [provider]  amiodarone (PACERONE) 400 MG tablet Take 1 tablet (400 mg total) by mouth daily. 12/16/19   Pokhrel, Corrie Mckusick, MD  aspirin 81 MG chewable tablet Chew 81 mg by mouth daily at 12 noon. 03/30/21   [provider]  atorvastatin (LIPITOR) 80 MG tablet Take 80 mg by mouth daily. 05/13/21   [provider]  benzonatate (TESSALON PERLES) 100 MG capsule Take 1 capsule (100 mg total) by mouth every 6 (six) hours as needed for cough. 06/16/21 06/16/22  Lavina Hamman, MD  dextromethorphan-guaiFENesin [facility] DM) 30-600 MG 12hr tablet Take 1 tablet by mouth 2 (two) times daily. 06/16/21   Lavina Hamman, MD  ELIQUIS 5 MG TABS tablet Take 5 mg by mouth 2 (two) times daily. 05/18/21   [provider]  Fluticasone-Salmeterol (ADVAIR) 500-50 MCG/DOSE AEPB Inhale 1 puff into the lungs 2 (two) times daily. 12/09/19   [provider]  gabapentin (NEURONTIN) 100 MG capsule Take 100 mg by mouth at bedtime. 12/09/19   [provider]  HYDROcodone-acetaminophen (NORCO/VICODIN) 5-325 MG tablet Take 1 tablet by mouth every 6 (six) hours as needed for severe pain. Patient not taking: Reported on 06/13/2021 01/17/20   Lavonia Drafts, MD  ipratropium-albuterol (DUONEB) 0.5-2.5 (3) MG/3ML SOLN Take 3 mLs by nebulization every 4 (four) hours as needed. 06/16/21   Lavina Hamman, MD  magnesium chloride (SLOW-MAG) 64 MG TBEC SR tablet Take 1 tablet (64 mg total) by mouth daily. 10/29/19   Darel Hong D, NP   magnesium oxide (MAG-OX) 400 (240 Mg) MG tablet Take 1 tablet by mouth daily. 02/05/21   [provider]  melatonin 3 MG TABS tablet Take 9 mg by mouth at bedtime.    [provider]  melatonin 5 MG TABS Take 0.5 tablets (2.5 mg total) by mouth at bedtime as needed (sleep). 10/29/19   Bradly Bienenstock, NP  metoprolol succinate (TOPROL-XL) 50 MG 24 hr tablet Take 75 mg by mouth 2 (two) times daily. 05/20/21   [provider]  mexiletine (MEXITIL) 150 MG capsule Take 150 mg by mouth every 8 (eight) hours. 12/01/19   [provider]  Multiple Vitamin (MULTIVITAMIN WITH MINERALS) TABS tablet Take 1 tablet by mouth daily. 10/29/19   Bradly Bienenstock, NP  Nebulizer MISC 1 each by Does not apply route as needed. 06/16/21   Lavina Hamman, MD  omeprazole (PRILOSEC) 20 MG capsule Take 20 mg by mouth every morning. 05/18/21   [provider]  oxymetazoline (AFRIN) 0.05 % nasal spray Place 2 sprays into both nostrils 2 (two) times daily as needed for congestion (nose bleeds). 07/13/21 07/13/22  Naaman Plummer, MD  predniSONE (DELTASONE) 10 MG tablet Take 40mg  daily for 3days,Take 30mg  daily for 3days,Take 20mg  daily for 3days,Take 10mg  daily for 3days, then stop 06/16/21   Lavina Hamman, MD  tamsulosin (FLOMAX) 0.4 MG CAPS capsule Take 0.4 mg by mouth daily. 12/09/19   [provider]  torsemide (DEMADEX) 20 MG tablet Take 1 tablet (20 mg total) by mouth daily. 06/17/21   Lavina Hamman, MD    Physical Exam   Triage Vital Signs: ED Triage Vitals  Enc Vitals Group     BP 08/10/21 0415 (!) 162/89     Pulse Rate 08/10/21 0415 80     Resp 08/10/21 0415 20     Temp 08/10/21 0415 97.7 F (36.5 C)     Temp Source 08/10/21 0415 Oral     SpO2 08/10/21 0414 100 %     Weight 08/10/21 0416 149 lb 14.6 oz (68 kg)     Height 08/10/21 0416 5\' 8"  (1.727 m)     Head Circumference --      Peak Flow --      Pain Score 08/10/21 0416 0     Pain Loc --      Pain  Edu? --      Excl. in [place]? --     Most recent vital signs: Vitals:   08/10/21 0415 08/10/21 0500  BP: (!) 162/89 (!) 151/81  Pulse: 80 66  Resp: 20 18  Temp: 97.7 F (36.5 C)   SpO2: 100% 100%    CONSTITUTIONAL: Alert and oriented and responds appropriately to questions.  Elderly.  Has some increased work of breathing. HEAD: Normocephalic, atraumatic EYES: Conjunctivae clear, pupils appear equal, sclera nonicteric ENT: normal nose; moist mucous membranes NECK: Supple, normal  ROM CARD: RRR; S1 and S2 appreciated; no murmurs, no clicks, no rubs, no gallops RESP: Patient is slightly tachypneic.  He has diffuse inspiratory and expiratory wheezes heard.  No rhonchi or rales.  Diminished at bases bilaterally. ABD/GI: Normal bowel sounds; non-distended; soft, non-tender, no rebound, no guarding, no peritoneal signs BACK: The back appears normal EXT: Normal ROM in all joints; no deformity noted, no edema; no cyanosis, no calf tenderness or calf swelling SKIN: Normal color for age and race; warm; no rash on exposed skin NEURO: Moves all extremities equally, normal speech PSYCH: The patient's mood and manner are appropriate.   ED Results / Procedures / Treatments   LABS: (all labs ordered are listed, but only abnormal results are displayed) Labs Reviewed  BASIC METABOLIC PANEL - Abnormal; Notable for the following components:      Result Value   Sodium 134 (*)    Glucose, Bld 107 (*)    Creatinine, Ser 2.07 (*)    GFR, Estimated 32 (*)    Anion gap 4 (*)    All other components within normal limits  BRAIN NATRIURETIC PEPTIDE - Abnormal; Notable for the following components:   B Natriuretic Peptide 411.3 (*)    All other components within normal limits  CBC - Abnormal; Notable for the following components:   RBC 3.28 (*)    Hemoglobin 9.9 (*)    HCT 31.4 (*)    All other components within normal limits  RESP PANEL BY RT-PCR (FLU A&B, COVID) ARPGX2  TROPONIN I (HIGH  SENSITIVITY)     EKG:  EKG Interpretation  Date/Time:  Friday August 10 2021 04:18:55 EST Ventricular Rate:  75 PR Interval:  257 QRS Duration: 128 QT Interval:  416 QTC Calculation: 465 R Axis:   63 Text Interpretation: Sinus rhythm Prolonged PR interval Left bundle branch block Confirmed by Pryor Curia [phone]) on 08/10/2021 4:23:27 AM         RADIOLOGY: My personal review and interpretation of imaging: X-ray shows CHF.  I have personally reviewed all radiology reports.   DG Chest Portable 1 View  Result Date: 08/10/2021 CLINICAL DATA:  Shortness of breath EXAM: PORTABLE CHEST 1 VIEW COMPARISON:  07/13/2021 FINDINGS: Diffuse interstitial coarsening and increased hazy density of the chest. No Kerley lines or pleural fluid. No pneumothorax. Normal heart size. Mild aortic tortuosity. Dual-chamber pacer leads/ICD leads from the left. Extensive right coronary stenting. IMPRESSION: Symmetric interstitial opacity which could be bronchitis or failure. Electronically Signed   By: Jorje Guild M.D.   On: 08/10/2021 04:52     PROCEDURES:  Critical Care performed: Yes, see critical care procedure note(s)   CRITICAL CARE Performed by: Cyril Mourning Loveda Colaizzi   Total critical care time: 45 minutes  Critical care time was exclusive of separately billable procedures and treating other patients.  Critical care was necessary to treat or prevent imminent or life-threatening deterioration.  Critical care was time spent personally by me on the following activities: development of treatment plan with patient and/or surrogate as well as nursing, discussions with consultants, evaluation of patient's response to treatment, examination of patient, obtaining history from patient or surrogate, ordering and performing treatments and interventions, ordering and review of laboratory studies, ordering and review of radiographic studies, pulse oximetry and re-evaluation of patient's condition.   [name]1-3 Lead  EKG Interpretation Performed by: Sanyia Dini, Delice Bison, DO Authorized by: Marijean Montanye, Delice Bison, DO     Interpretation: normal     ECG rate:  85   ECG  rate assessment: normal     Rhythm: sinus rhythm     Ectopy: none     Conduction: normal      IMPRESSION / MDM / ASSESSMENT AND PLAN / ED COURSE  I reviewed the triage vital signs and the nursing notes.    Patient here with shortness of breath and increasing oxygen requirement.  The patient is on the cardiac monitor to evaluate for evidence of arrhythmia and/or significant heart rate changes.   DIFFERENTIAL DIAGNOSIS (includes but not limited to):   CHF exacerbation, COPD exacerbation, ACS, PE, dissection, pneumonia, pneumothorax, COVID-19, influenza   PLAN: We will obtain CBC, BMP, troponin x2, BNP, chest x-ray, EKG.  Will give DuoNebs and Solu-Medrol given he is wheezing.   MEDICATIONS GIVEN IN ED: Medications  ipratropium-albuterol (DUONEB) 0.5-2.5 (3) MG/3ML nebulizer solution 3 mL (3 mLs Nebulization Given 08/10/21 0457)  methylPREDNISolone sodium succinate (SOLU-MEDROL) 125 mg/2 mL injection 125 mg (125 mg Intravenous Given 08/10/21 0452)  furosemide (LASIX) injection 40 mg (40 mg Intravenous Given 08/10/21 0502)  ipratropium-albuterol (DUONEB) 0.5-2.5 (3) MG/3ML nebulizer solution 3 mL (3 mLs Nebulization Given 08/10/21 0529)     ED COURSE:  4:55 AM  Spoke with Gerald Stabs with [employer].  States device is functioning properly with no acute events.  It does detect that he appears volume overloaded since about February 5.  Labs showed chronic kidney disease which is stable.  BNP elevated at 411.  No leukocytosis.  He has anemia which has been stable over the past month.  First troponin negative.  COVID and flu negative.  Chest x-ray reviewed by myself and radiologist shows diffuse edema.  Will give Lasix.  Clinically he feels like he is improving with breathing treatments.  Will give another DuoNeb.  Will discuss with hospitalist for  admission.   CONSULTS:  Consulted and discussed patient's case with hospitalist, Dr. Velia Meyer.  I have recommended admission and consulting physician agrees and will place admission orders.  Patient (and family if present) agree with this plan.   I reviewed all nursing notes, vitals, pertinent previous records.  All labs, EKGs, imaging ordered have been independently reviewed and interpreted by myself.    OUTSIDE RECORDS REVIEWED: Reviewed patient's admission from 06/13/2021 to 06/16/2021.  Patient was admitted for acute hypoxic respiratory failure then and was thought to have a CHF exacerbation and also thought to have COPD as well.         FINAL CLINICAL IMPRESSION(S) / ED DIAGNOSES   Final diagnoses:  Acute respiratory failure with hypoxia ([name])  Acute on chronic congestive heart failure, unspecified heart failure type ([name])  Chronic obstructive pulmonary disease with acute exacerbation ([place])     Rx / DC Orders   ED Discharge Orders     None        Note:  This document was prepared using Dragon voice recognition software and may include unintentional dictation errors.   Yanci Bachtell, Delice Bison, DO 08/10/21 [phone]

## 2021-08-10 NOTE — ED Notes (Signed)
Blaine Hamper, MD called for patient to be downgraded from progressive care. Blaine Hamper, MD stated that he will come see patient at a later time to determine this.

## 2021-08-10 NOTE — H&P (Signed)
History and Physical    JAMAURI KRUZEL QMV:[id number] DOB: Jul 17, 1940 DOA: 08/10/2021  Referring MD/NP/PA:   PCP: [place] Pink, MD   Patient coming from:  The patient is coming from home.  At baseline, pt is independent for most of ADL.        Chief Complaint: SOB  HPI: Matthew Zamora is a 81 y.o. 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  Patient states that his shortness breath has progressively been worsening since yesterday.  He has dry cough, wheezing, no chest pain.  No fever or chills.  He states that he used 2 L oxygen as needed at home, but now he needs 2 L oxygen constantly.  No nausea, vomiting, diarrhea or abdominal pain with no symptoms of UTI.  Data Reviewed and ED Course: pt was found to have BNP 411, troponin level 14 --> 15, negative COVID PCR, kidney function close to baseline, temperature normal, blood pressure 162/89, 118/71, heart rate 83, RR 18.  Chest x-ray showed interstitial pulmonary edema.  Patient is admitted to telemetry bed as inpatient.  EKG: I have personally reviewed.  Sinus rhythm, QTc 465, low voltage, left bundle blockade, early R wave progression, mild ST depression in V4-V6, I do not see pacer markers on EKG.   Review of Systems:   General: no fevers, chills, no body weight gain, has fatigue HEENT: no blurry vision, hearing changes or sore throat Respiratory: has dyspnea, coughing, wheezing CV: no chest pain, no palpitations GI: no nausea, vomiting, abdominal pain, diarrhea, constipation GU: no dysuria, burning on urination, increased urinary frequency, hematuria  Ext: has leg edema Neuro: no unilateral weakness, numbness, or tingling, no vision change or hearing  loss Skin: no rash, no skin tear. MSK: No muscle spasm, no deformity, no limitation of range of movement in spin Heme: No easy bruising.  Travel history: No recent long distant travel.   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 pylori infection    History of hepatitis    Hyperlipidemia    Hypertension    Liver cyst 05/16/15   PAF (paroxysmal atrial fibrillation) ([place])    Prostate CA [facility])     Past Surgical History:  Procedure Laterality Date   CATARACT EXTRACTION     COLONOSCOPY  10/06/2014, 09/18/2004, 03/14/2009   ESOPHAGOGASTRODUODENOSCOPY  10/06/2014, 03/18/2001, 03/14/2009   ESOPHAGOGASTRODUODENOSCOPY (EGD) WITH PROPOFOL N/A 05/07/2018   Procedure: ESOPHAGOGASTRODUODENOSCOPY (EGD) WITH PROPOFOL;  Surgeon: Toledo, [name], MD;  Location: [facility] ENDOSCOPY;  Service: Gastroenterology;  Laterality: N/A;   FLEXIBLE SIGMOIDOSCOPY  08/26/1990   INSERTION OF ICD     PROSTATE SURGERY     TONSILLECTOMY      Social History:  reports that he quit smoking about 22 years ago. His smoking use included cigarettes. He has a 38.00 pack-year smoking history. He has never used smokeless tobacco. He reports that he does not  currently use drugs. He reports that he does not drink alcohol.  Family History:  Family History  Problem Relation Age of Onset   Heart attack Mother    Heart attack Father      Prior to Admission medications   Medication Sig Start Date End Date Taking? Authorizing Provider  acetaminophen (TYLENOL) 325 MG tablet Take 2 tablets (650 mg total) by mouth every 4 (four) hours as needed for headache or mild pain.  10/29/19   Bradly Bienenstock, NP  albuterol (PROVENTIL) (2.5 MG/3ML) 0.083% nebulizer solution Inhale 3 mLs (2.5 mg total) into the lungs as needed for shortness of breath. 10/29/19   Bradly Bienenstock, NP  albuterol (VENTOLIN HFA) 108 (90 Base) MCG/ACT inhaler Inhale 2 puffs into the lungs every 4 (four) hours as needed. 04/21/21   [provider]  amiodarone (PACERONE) 400 MG tablet Take 1 tablet (400 mg total) by mouth daily. 12/16/19   Pokhrel, Corrie Mckusick, MD  aspirin 81 MG chewable tablet Chew 81 mg by mouth daily at 12 noon. 03/30/21   [provider]  atorvastatin (LIPITOR) 80 MG tablet Take 80 mg by mouth daily. 05/13/21   [provider]  benzonatate (TESSALON PERLES) 100 MG capsule Take 1 capsule (100 mg total) by mouth every 6 (six) hours as needed for cough. 06/16/21 06/16/22  Lavina Hamman, MD  dextromethorphan-guaiFENesin [facility] DM) 30-600 MG 12hr tablet Take 1 tablet by mouth 2 (two) times daily. 06/16/21   Lavina Hamman, MD  ELIQUIS 5 MG TABS tablet Take 5 mg by mouth 2 (two) times daily. 05/18/21   [provider]  Fluticasone-Salmeterol (ADVAIR) 500-50 MCG/DOSE AEPB Inhale 1 puff into the lungs 2 (two) times daily. 12/09/19   [provider]  gabapentin (NEURONTIN) 100 MG capsule Take 100 mg by mouth at bedtime. 12/09/19   [provider]  HYDROcodone-acetaminophen (NORCO/VICODIN) 5-325 MG tablet Take 1 tablet by mouth every 6 (six) hours as needed for severe pain. Patient not taking: Reported on 06/13/2021 01/17/20   Lavonia Drafts, MD  ipratropium-albuterol (DUONEB) 0.5-2.5 (3) MG/3ML SOLN Take 3 mLs by nebulization every 4 (four) hours as needed. 06/16/21   Lavina Hamman, MD  magnesium chloride (SLOW-MAG) 64 MG TBEC SR tablet Take 1 tablet (64 mg total) by mouth daily. 10/29/19   Darel Hong D, NP  magnesium oxide (MAG-OX) 400 (240 Mg) MG tablet Take 1 tablet by mouth daily. 02/05/21   [provider]  melatonin 3 MG TABS  tablet Take 9 mg by mouth at bedtime.    [provider]  melatonin 5 MG TABS Take 0.5 tablets (2.5 mg total) by mouth at bedtime as needed (sleep). 10/29/19   Bradly Bienenstock, NP  metoprolol succinate (TOPROL-XL) 50 MG 24 hr tablet Take 75 mg by mouth 2 (two) times daily. 05/20/21   [provider]  mexiletine (MEXITIL) 150 MG capsule Take 150 mg by mouth every 8 (eight) hours. 12/01/19   [provider]  Multiple Vitamin (MULTIVITAMIN WITH MINERALS) TABS tablet Take 1 tablet by mouth daily. 10/29/19   Bradly Bienenstock, NP  Nebulizer MISC 1 each by Does not apply route as needed. 06/16/21   Lavina Hamman, MD  omeprazole (PRILOSEC) 20 MG capsule Take 20 mg by mouth every morning. 05/18/21   [provider]  oxymetazoline (AFRIN) 0.05 % nasal spray Place 2 sprays into both nostrils 2 (two) times daily as needed for congestion (nose bleeds). 07/13/21 07/13/22  Valora Piccolo  K, MD  predniSONE (DELTASONE) 10 MG tablet Take 40mg  daily for 3days,Take 30mg  daily for 3days,Take 20mg  daily for 3days,Take 10mg  daily for 3days, then stop 06/16/21   Lavina Hamman, MD  tamsulosin (FLOMAX) 0.4 MG CAPS capsule Take 0.4 mg by mouth daily. 12/09/19   [provider]  torsemide (DEMADEX) 20 MG tablet Take 1 tablet (20 mg total) by mouth daily. 06/17/21   Lavina Hamman, MD    Physical Exam: Vitals:   08/10/21 0830 08/10/21 1100 08/10/21 1244 08/10/21 1600  BP: 118/71 (!) 110/44 (!) 148/119 135/72  Pulse: 83 65 69 70  Resp: 18  17 14   Temp:   97.7 F (36.5 C)   TempSrc:   Oral   SpO2: 100% 100% 100% 98%  Weight:      Height:       General: Not in acute distress HEENT:       Eyes: PERRL, EOMI, no scleral icterus.       ENT: No discharge from the ears and nose, no pharynx injection, no tonsillar enlargement.        Neck: Positive JVD, no bruit, no mass felt. Heme: No neck lymph node enlargement. Cardiac: S1/S2, RRR, No murmurs, No gallops or rubs. Respiratory:  Has wheezing and fine crackles bilaterally GI: Soft, nondistended, nontender, no rebound pain, no organomegaly, BS present. GU: No hematuria Ext: 1+ pitting leg edema bilaterally. 1+DP/PT pulse bilaterally. Musculoskeletal: No joint deformities, No joint redness or warmth, no limitation of ROM in spin. Skin: No rashes.  Neuro: Alert, oriented X3, cranial nerves II-XII grossly intact, moves all extremities normally. Psych: Patient is not psychotic, no suicidal or hemocidal ideation.  Labs on Admission: I have personally reviewed following labs and imaging studies  CBC: Recent Labs  Lab 08/10/21 0426  WBC 6.7  HGB 9.9*  HCT 31.4*  MCV 95.7  PLT 573   Basic Metabolic Panel: Recent Labs  Lab 08/10/21 0426 08/10/21 0726  NA 134*  --   K 4.3  --   CL 105  --   CO2 25  --   GLUCOSE 107*  --   BUN 19  --   CREATININE 2.07*  --   CALCIUM 9.0  --   MG  --  2.0   GFR: Estimated Creatinine Clearance: 27.8 mL/min (A) (by C-G formula based on SCr of 2.07 mg/dL (H)). Liver Function Tests: No results for input(s): AST, ALT, ALKPHOS, BILITOT, PROT, ALBUMIN in the last 168 hours. No results for input(s): LIPASE, AMYLASE in the last 168 hours. No results for input(s): AMMONIA in the last 168 hours. Coagulation Profile: No results for input(s): INR, PROTIME in the last 168 hours. Cardiac Enzymes: No results for input(s): CKTOTAL, CKMB, CKMBINDEX, TROPONINI in the last 168 hours. BNP (last 3 results) No results for input(s): PROBNP in the last 8760 hours. HbA1C: No results for input(s): HGBA1C in the last 72 hours. CBG: No results for input(s): GLUCAP in the last 168 hours. Lipid Profile: No results for input(s): CHOL, HDL, LDLCALC, TRIG, CHOLHDL, LDLDIRECT in the last 72 hours. Thyroid Function Tests: No results for input(s): TSH, T4TOTAL, FREET4, T3FREE, THYROIDAB in the last 72 hours. Anemia Panel: No results for input(s): VITAMINB12, FOLATE, FERRITIN, TIBC, IRON, RETICCTPCT in  the last 72 hours. Urine analysis:    Component Value Date/Time   COLORURINE YELLOW (A) 09/17/2018 0833   APPEARANCEUR HAZY (A) 09/17/2018 0833   LABSPEC 1.020 09/17/2018 0833   PHURINE 5.0 09/17/2018 2202  GLUCOSEU NEGATIVE 09/17/2018 0833   HGBUR NEGATIVE 09/17/2018 0833   BILIRUBINUR NEGATIVE 09/17/2018 [place] 09/17/2018 0833   PROTEINUR NEGATIVE 09/17/2018 0833   NITRITE NEGATIVE 09/17/2018 0833   LEUKOCYTESUR SMALL (A) 09/17/2018 0833   Sepsis Labs: @LABRCNTIP (procalcitonin:4,lacticidven:4) ) Recent Results (from the past 240 hour(s))  Resp Panel by RT-PCR (Flu A&B, Covid) Nasopharyngeal Swab     Status: None   Collection Time: 08/10/21  4:26 AM   Specimen: Nasopharyngeal Swab; Nasopharyngeal(NP) swabs in vial transport medium  Result Value Ref Range Status   SARS Coronavirus 2 by RT PCR NEGATIVE NEGATIVE Final    Comment: (NOTE) SARS-CoV-2 target nucleic acids are NOT DETECTED.  The SARS-CoV-2 RNA is generally detectable in upper respiratory specimens during the acute phase of infection. The lowest concentration of SARS-CoV-2 viral copies this assay can detect is 138 copies/mL. A negative result does not preclude SARS-Cov-2 infection and should not be used as the sole basis for treatment or other patient management decisions. A negative result may occur with  improper specimen collection/handling, submission of specimen other than nasopharyngeal swab, presence of viral mutation(s) within the areas targeted by this assay, and inadequate number of viral copies(<138 copies/mL). A negative result must be combined with clinical observations, patient history, and epidemiological information. The expected result is Negative.  Fact Sheet for Patients:  EntrepreneurPulse.com.au  Fact Sheet for Healthcare Providers:  IncredibleEmployment.be  This test is no t yet approved or cleared by the [place] FDA and  has  been authorized for detection and/or diagnosis of SARS-CoV-2 by FDA under an Emergency Use Authorization (EUA). This EUA will remain  in effect (meaning this test can be used) for the duration of the COVID-19 declaration under Section 564(b)(1) of the Act, 21 U.S.C.section 360bbb-3(b)(1), unless the authorization is terminated  or revoked sooner.       Influenza A by PCR NEGATIVE NEGATIVE Final   Influenza B by PCR NEGATIVE NEGATIVE Final    Comment: (NOTE) The Xpert Xpress SARS-CoV-2/FLU/RSV plus assay is intended as an aid in the diagnosis of influenza from Nasopharyngeal swab specimens and should not be used as a sole basis for treatment. Nasal washings and aspirates are unacceptable for Xpert Xpress SARS-CoV-2/FLU/RSV testing.  Fact Sheet for Patients: EntrepreneurPulse.com.au  Fact Sheet for Healthcare Providers: IncredibleEmployment.be  This test is not yet approved or cleared by the [place] FDA and has been authorized for detection and/or diagnosis of SARS-CoV-2 by FDA under an Emergency Use Authorization (EUA). This EUA will remain in effect (meaning this test can be used) for the duration of the COVID-19 declaration under Section 564(b)(1) of the Act, 21 U.S.C. section 360bbb-3(b)(1), unless the authorization is terminated or revoked.  Performed at [facility], [address] [place], [place] [address]      Radiological Exams on Admission: DG Chest Portable 1 View  Result Date: 08/10/2021 CLINICAL DATA:  Shortness of breath EXAM: PORTABLE CHEST 1 VIEW COMPARISON:  07/13/2021 FINDINGS: Diffuse interstitial coarsening and increased hazy density of the chest. No Kerley lines or pleural fluid. No pneumothorax. Normal heart size. Mild aortic tortuosity. Dual-chamber pacer leads/ICD leads from the left. Extensive right coronary stenting. IMPRESSION: Symmetric interstitial opacity which could be bronchitis or failure.  Electronically Signed   By: Jorje Guild M.D.   On: 08/10/2021 04:52   ECHOCARDIOGRAM COMPLETE  Result Date: 08/10/2021    ECHOCARDIOGRAM REPORT   Patient Name:   Matthew Zamora Date of Exam: 08/10/2021 Medical Rec #:  [id number]           Height:       68.0 in Accession #:    [id number]          Weight:       149.9 lb Date of Birth:  [birth year]           BSA:          1.808 m Patient Age:    44 years            BP:           123/68 mmHg Patient Gender: M                   HR:           107 bpm. Exam Location:  [facility] Procedure: 2D Echo, Cardiac Doppler and Color Doppler Indications:     R67.89 Acute Systolic Heart Failure.  History:         Patient has prior history of Echocardiogram examinations, most                  recent 06/13/2021. CHF, CAD, Arrythmias:Atrial Fibrillation;                  Risk Factors:Hypertension and Dyslipidemia.  Sonographer:     Cresenciano Lick RDCS Referring Phys:  [id number] Ireoluwa Gorsline Diagnosing Phys: Yolonda Kida MD IMPRESSIONS  1. Left ventricular ejection fraction, by estimation, is 35 to 40%. The left ventricle has normal function. The left ventricle demonstrates regional wall motion abnormalities (see scoring diagram/findings for description). Left ventricular diastolic parameters are consistent with Grade II diastolic dysfunction (pseudonormalization).  2. Right ventricular systolic function is normal. The right ventricular size is normal.  3. The mitral valve is normal in structure. No evidence of mitral valve regurgitation.  4. The aortic valve is normal in structure. Aortic valve regurgitation is not visualized. FINDINGS  Left Ventricle: Left ventricular ejection fraction, by estimation, is 35 to 40%. The left ventricle has normal function. The left ventricle demonstrates regional wall motion abnormalities. The left ventricular internal cavity size was normal in size. There is borderline left ventricular hypertrophy. Left ventricular diastolic parameters are  consistent with Grade II diastolic dysfunction (pseudonormalization). Right Ventricle: The right ventricular size is normal. No increase in right ventricular wall thickness. Right ventricular systolic function is normal. Left Atrium: Left atrial size was normal in size. Right Atrium: Right atrial size was normal in size. Pericardium: There is no evidence of pericardial effusion. Mitral Valve: The mitral valve is normal in structure. No evidence of mitral valve regurgitation. Tricuspid Valve: The tricuspid valve is normal in structure. Tricuspid valve regurgitation is not demonstrated. Aortic Valve: The aortic valve is normal in structure. Aortic valve regurgitation is not visualized. Pulmonic Valve: The pulmonic valve was grossly normal. Pulmonic valve regurgitation is not visualized. Aorta: The ascending aorta was not well visualized. IAS/Shunts: No atrial level shunt detected by color flow Doppler.  LEFT VENTRICLE PLAX 2D LVIDd:         4.70 cm   Diastology LVIDs:         3.90 cm   LV e' medial:    7.07 cm/s LV PW:         1.00 cm   LV E/e' medial:  14.6 LV IVS:        1.20 cm   LV e' lateral:   6.53 cm/s LVOT diam:     2.10 cm   LV  E/e' lateral: 15.8 LV SV:         66 LV SV Index:   36 LVOT Area:     3.46 cm  RIGHT VENTRICLE             IVC RV Basal diam:  3.70 cm     IVC diam: 1.10 cm RV S prime:     15.10 cm/s TAPSE (M-mode): 2.6 cm LEFT ATRIUM             Index        RIGHT ATRIUM           Index LA diam:        5.40 cm 2.99 cm/m   RA Area:     15.30 cm LA Vol (A2C):   93.2 ml 51.55 ml/m  RA Volume:   39.60 ml  21.90 ml/m LA Vol (A4C):   79.4 ml 43.91 ml/m LA Biplane Vol: 86.1 ml 47.62 ml/m  AORTIC VALVE LVOT Vmax:   78.55 cm/s LVOT Vmean:  52.250 cm/s LVOT VTI:    0.189 m  AORTA Ao Root diam: 3.90 cm Ao Asc diam:  3.90 cm MITRAL VALVE MV Area (PHT): 3.85 cm     SHUNTS MV Decel Time: 197 msec     Systemic VTI:  0.19 m MV E velocity: 103.00 cm/s  Systemic Diam: 2.10 cm MV A velocity: 78.80 cm/s MV E/A  ratio:  1.31 Dwayne D Callwood MD Electronically signed by Yolonda Kida MD Signature Date/Time: 08/10/2021/1:32:18 PM    Final       Assessment/Plan Principal Problem:   Acute on chronic combined systolic (congestive) and diastolic (congestive) heart failure ([name]) Active Problems:   Acute on chronic respiratory failure with hypoxia ([name])   COPD exacerbation ([name])   Paroxysmal atrial fibrillation ([name])   Essential hypertension   Hyperlipidemia   CAD (coronary artery disease)   CKD (chronic kidney disease), stage IIIb   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  Patient has wheezing on auscultation, indicating COPD exacerbation.  Patient has leg edema, positive JVD, elevated BNP 411, pulmonary edema  on chest x-ray, clinically consistent with CHF exacerbation.  -Admitted to telemetry bed as inpatient -Bronchodilators -IV diuretics for CHF -Nasal cannula oxygen to maintain oxygen saturation above 93%  Acute on chronic combined systolic (congestive) and diastolic (congestive) heart failure ([place]): 2D echo on 06/13/2021 showed EF of 40-45% with grade 1 diastolic dysfunction.  Now patient has CHF exacerbation. -Lasix 40 mg bid by IV -2d echo -Daily weights -strict I/O's -Low salt diet -Fluid restriction -Obtain REDs Vest reading  COPD exacerbation ([place]): Patient does not have productive cough, will not start antibiotics. -Bronchodilators -Solu-Medrol 40 mg IV bid -Mucinex for cough  -Incentive spirometry  Paroxysmal atrial fibrillation ([name]) -Amiodarone, metoprolol, Mexitil -Eliquis  Essential hypertension -IV hydralazine as needed -Metoprolol,  Hyperlipidemia -Lipitor  CAD (coronary artery disease) -Aspirin, Lipitor  CKD (chronic kidney disease), stage IIIb: Close to baseline.  Recent creatinine 2.14 on 07/13/2021.  His creatinine  is at 2.07, BUN 19. -Follow-up with BMP          DVT ppx: on Eliquis  Code Status: Full code per pt and his son  Family Communication:  Yes, patient's  son   by phone  Disposition Plan:  Anticipate discharge back to previous environment  Consults called:  none  Admission  status and Level of care: Telemetry Medical:    as inpt      Severity of Illness:  The appropriate patient status for this patient is INPATIENT. Inpatient status is judged to be reasonable and necessary in order to provide the required intensity of service to ensure the patient's safety. The patient's presenting symptoms, physical exam findings, and initial radiographic and laboratory data in the context of their chronic comorbidities is felt to place them at high risk for further clinical deterioration. Furthermore, it is not anticipated that the patient will be medically stable for discharge from the hospital within 2 midnights of admission.   * I certify that at the point of admission it is my clinical judgment that the patient will require inpatient hospital care spanning beyond 2 midnights from the point of admission due to high intensity of service, high risk for further deterioration and high frequency of surveillance required.*       Date of Service 08/10/2021    Ivor Costa [facility]   If 7PM-7AM, please contact night-coverage [email or web address] 08/10/2021, 5:14 PM

## 2021-08-10 NOTE — TOC Initial Note (Signed)
Transition of Care [facility]) - Initial/Assessment Note    Patient Details  Name: Matthew Zamora MRN: [id number] Date of Birth: 08-Nov-1940  Transition of Care [facility]) CM/SW Contact:    Shelbie Hutching, RN Phone Number: 08/10/2021, 2:11 PM  Clinical Narrative:                 Patient admitted to the hospital with acute on chronic systolic and diastolic CHF.  RNCM met with patient at the bedside introduced self and explained role.  Patient is from home where he lives alone and is independent.  He has not been driving for the past few months so his daughter in law provides transportation.  Patient has walker, bedside commode, shower chair, and cane.  He has been walking without assistive devices.   Patient has had home health in the past but he declines to be set up with [facility] this admission, he reports that his daughter in law is a Marine scientist and she helps him out and checks on him twice daily, morning and afternoon.  TOC will cont to follow but no needs identified at this time.   Expected Discharge Plan: Home/Self Care Barriers to Discharge: Continued Medical Work up   Patient Goals and CMS Choice Patient states their goals for this hospitalization and ongoing recovery are:: patient wants to get back home      Expected Discharge Plan and Services Expected Discharge Plan: Home/Self Care   Discharge Planning Services: CM Consult   Living arrangements for the past 2 months: Mobile Home                 DME Arranged: N/A DME Agency: NA       HH Arranged: Patient Refused [place] Agency: NA        Prior Living Arrangements/Services Living arrangements for the past 2 months: Mobile Home Lives with:: Self Patient language and need for interpreter reviewed:: Yes Do you feel safe going back to the place where you live?: Yes      Need for Family Participation in Patient Care: Yes (Comment) Care giver support system in place?: Yes (comment) (son and daughter in law) Current home services: DME  (walker, cane, bedside commode, shower chair) Criminal Activity/Legal Involvement Pertinent to Current Situation/Hospitalization: No - Comment as needed  Activities of Daily Living      Permission Sought/Granted Permission sought to share information with : Case Manager, Family Supports Permission granted to share information with : Yes, Verbal Permission Granted  Share Information with NAME: Macallister Ashmead     Permission granted to share info w Relationship: son  Permission granted to share info w Contact Information: [phone]  Emotional Assessment Appearance:: Appears stated age Attitude/Demeanor/Rapport: Engaged Affect (typically observed): Accepting Orientation: : Oriented to Self, Oriented to Place, Oriented to  Time, Oriented to Situation Alcohol / Substance Use: Not Applicable Psych Involvement: No (comment)  Admission diagnosis:  Acute on chronic combined systolic (congestive) and diastolic (congestive) heart failure ([name]) [I50.43] Acute on chronic combined systolic and diastolic congestive heart failure ([name]) [I50.43] Patient Active Problem List   Diagnosis Date Noted   Acute on chronic combined systolic (congestive) and diastolic (congestive) heart failure ([place]) 08/10/2021   Acute respiratory failure with hypoxia ([place])    HCAP (healthcare-associated pneumonia) 06/13/2021   Acute on chronic systolic CHF (congestive heart failure) ([place]) 06/13/2021   Elevated troponin 06/13/2021   CKD (chronic kidney disease), 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   Lung nodule 06/13/2021   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  COPD exacerbation ([place]) 06/13/2021   Volume depletion 12/15/2019   Hypotension 12/15/2019   Cardiorenal syndrome    Atrial fibrillation with RVR ([name])    Defibrillator discharge    Tachycardia 10/25/2019   CHF (congestive heart failure) ([place]) 09/18/2019   Acute on chronic combined systolic and diastolic CHF (congestive heart failure) ([place]) 09/17/2019   CAD (coronary artery disease)  09/17/2019   Ischemic cardiomyopathy 09/17/2019   CKD (chronic kidney disease) stage 3, GFR 30-59 ml/min ([name]) 09/17/2019   Acute hypoxemic respiratory failure ([place]) 09/17/2018   AAA (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 07/06/2018   Essential hypertension 07/06/2018   Hyperlipidemia 07/06/2018   Paroxysmal atrial fibrillation ([place])    Acute on chronic respiratory failure with hypoxia ([place]) 04/26/2018   PCP:  Maryland Pink, MD Pharmacy:   [facility] [address]), [place] [address] [place]) [place] [address] Phone: [phone] Fax: [phone]     Social Determinants of Health (SDOH) Interventions    Readmission Risk Interventions No flowsheet data found.

## 2021-08-10 NOTE — Progress Notes (Signed)
°  Carryover admission to the Day Admitter.  I discussed this case with the EDP, Dr. Leonides Schanz.  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  Presentation associated with increase in supplemental oxygen demand from baseline 2 L to 4 L, while chest x-ray reportedly showing evidence of interstitial edema suggestive of early heart failure.  He was reportedly admitted in December 2022 for similar, during which hospitalization echocardiogram was notable for LVEF 40 to 45% as well as grade 1 diastolic dysfunction.  While the patient reportedly denies any known history of underlying COPD, there was reportedly concern during most recent previous hospitalization for a potential element of underlying COPD.  Consequently, in the ED today, the patient, in addition to Lasix 40 mg IV x1, also received duo nebulizer treatment as well as solumedrol.  Subsequently, placed order for inpatient admission to PCU for further evaluation management of acute on chronic hypoxic respiratory failure due to acute on chronic combined heart failure. I have placed some additional preliminary admit orders via the adult multi morbid admission order set.  I have ordered VBG as well as add on serum magnesium level.  I have not ordered additional Lasix at this time.   Babs Bertin, DO Hospitalist

## 2021-08-10 NOTE — Consult Note (Signed)
° °  Heart Failure Nurse Navigator Note  HFrEF 35 to 40%.  Grade 2 diastolic dysfunction.  He presented to the emergency room with complaints of increasing shortness of breath and wheezing.  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  States that he lives by himself and that he fixes his own meals.  He states that his son will bring him sausage biscuit in the morning.  Discussed how those are higher in sodium.  He states that he does not use salt at the table and tries to grill a lot of his meats.  He states as far as vegetables he tries to do fresh.  Discussed fluid restriction of 64 ounces which is includes water tea coffee juice sodas and anything that melts at room temperature.  Patient has a scale but has not been himself.  Instructed on weighing first thing in the morning after going to the bathroom, before eating or drinking anything and recording.  Then to reported 2 to 3 pound weight gain overnight or 5 pounds within the week.  Also to report to his caregivers changes in symptoms such as increasing shortness of breath, swelling etc.  Discussed follow-up in the outpatient heart failure clinic he has an appointment for March 2 at 3 PM.  He has an 8% no-show ratio which is 4 out of 50 appointments.  He was given the living with heart failure teaching booklet,  information on low-sodium, weight chart and the zone magnet.  Pricilla Riffle RN CHFN

## 2021-08-10 NOTE — ED Triage Notes (Signed)
Pt to ED by EMS from home with c/o SOB which began earlier this morning, increasingly getting worse. Pt arrives on 4lnc and is sating at 100%.

## 2021-08-11 ENCOUNTER — Encounter: Payer: Self-pay | Admitting: Internal Medicine

## 2021-08-11 LAB — BASIC METABOLIC PANEL
Anion gap: 12 (ref 5–15)
BUN: 39 mg/dL — ABNORMAL HIGH (ref 8–23)
CO2: 24 mmol/L (ref 22–32)
Calcium: 9.5 mg/dL (ref 8.9–10.3)
Chloride: 96 mmol/L — ABNORMAL LOW (ref 98–111)
Creatinine, Ser: 2.19 mg/dL — ABNORMAL HIGH (ref 0.61–1.24)
GFR, Estimated: 30 mL/min — ABNORMAL LOW (ref >60)
Glucose, Bld: 149 mg/dL — ABNORMAL HIGH (ref 70–99)
Potassium: 4.2 mmol/L (ref 3.5–5.1)
Sodium: 132 mmol/L — ABNORMAL LOW (ref 135–145)

## 2021-08-11 LAB — MAGNESIUM: Magnesium: 2.1 mg/dL (ref 1.7–2.4)

## 2021-08-11 MED ORDER — IPRATROPIUM-ALBUTEROL 0.5-2.5 (3) MG/3ML IN SOLN
3.0000 mL | Freq: Two times a day (BID) | RESPIRATORY_TRACT | Status: DC
  Administered 2021-08-11 – 2021-08-12 (×2): 3 mL via RESPIRATORY_TRACT
  Filled 2021-08-11 (×2): qty 3

## 2021-08-11 NOTE — Consult Note (Signed)
CARDIOLOGY CONSULT NOTE               Patient ID: Matthew Zamora MRN: [id number] DOB/AGE: 1940/08/19 81 y.o.  Admit date: 08/10/2021 Referring Physician Dr Ivor Costa hospitalist Primary Physician Dr. Maryland Pink primary Primary Cardiologist [facility] Reason for Consultation shortness of breath  HPI: Patient states he was doing reasonably well at home he is followed by [facility] cardiology has a defibrillator in place and has had heart failure.  States he was doing reasonably well and started having acute dyspnea oxygen did not seem to help so he called rescue and was brought and patient was treated with aggressive nebulizer therapy given steroid therapy as well and then advised to be admitted troponin is 1 borderline patient has underlying atrial fibrillation shortness of breath is improved during my interview appears to have chronic renal insufficiency stage III denies any chest pain or shortness of breath no leg swelling no nausea vomiting no fever no chills or sweats  Review of systems complete and found to be negative unless listed above     Past Medical History:  Diagnosis Date   Abdominal aortic aneurysm (AAA) 05/13/15   seen on ct scan   Adenomatous colon polyp 03/18/2001, 03/14/2009, 10/06/2014   Anemia    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 pylori infection    History of hepatitis    Hyperlipidemia    Hypertension    Liver cyst 05/16/15   PAF (paroxysmal atrial fibrillation) ([place])    Prostate CA ([place])     Past Surgical History:  Procedure Laterality Date   CATARACT EXTRACTION     COLONOSCOPY  10/06/2014, 09/18/2004, 03/14/2009   ESOPHAGOGASTRODUODENOSCOPY  10/06/2014, 03/18/2001, 03/14/2009   ESOPHAGOGASTRODUODENOSCOPY (EGD) WITH PROPOFOL N/A 05/07/2018   Procedure: ESOPHAGOGASTRODUODENOSCOPY (EGD) WITH  PROPOFOL;  Surgeon: Toledo, [name], MD;  Location: [facility] ENDOSCOPY;  Service: Gastroenterology;  Laterality: N/A;   FLEXIBLE SIGMOIDOSCOPY  08/26/1990   INSERTION OF ICD     PROSTATE SURGERY     TONSILLECTOMY      (Not in a hospital admission)  Social History   Socioeconomic History   Marital status: Single    Spouse name: Not on file   Number of children: Not on file   Years of education: Not on file   Highest education level: Not on file  Occupational History   Not on file  Tobacco Use   Smoking status: Former    Packs/day: 1.00    Years: 38.00    Pack years: 38.00    Types: Cigarettes    Quit date: 06/18/1999    Years since quitting: 22.1   Smokeless tobacco: Never  Vaping Use   Vaping Use: Never used  Substance and Sexual Activity   Alcohol use: No    Alcohol/week: 0.0 standard drinks   Drug use: Not Currently   Sexual activity: Not Currently  Other Topics Concern   Not on file  Social History Narrative   Not on file   Social Determinants of Health   Financial Resource Strain: Not on file  Food Insecurity: Not on file  Transportation Needs: Not on file  Physical Activity: Not on file  Stress: Not on file  Social Connections: Not on file  Intimate Partner Violence: Not on file    Family History  Problem  Relation Age of Onset   Heart attack Mother    Heart attack Father       Review of systems complete and found to be negative unless listed above      PHYSICAL EXAM  General: Well developed, well nourished, in no acute distress HEENT:  Normocephalic and atramatic Neck:  No JVD.  Lungs: Clear bilaterally to auscultation and percussion. Heart: HRRR . Normal S1 and S2 without gallops or murmurs.  Abdomen: Bowel sounds are positive, abdomen soft and non-tender  Msk:  Back normal, normal gait. Normal strength and tone for age. Extremities: No clubbing, cyanosis or edema.   Neuro: Alert and oriented X 3. Psych:  Good affect, responds  appropriately  Labs:   Lab Results  Component Value Date   WBC 6.7 08/10/2021   HGB 9.9 (L) 08/10/2021   HCT 31.4 (L) 08/10/2021   MCV 95.7 08/10/2021   PLT 171 08/10/2021    Recent Labs  Lab 08/11/21 0622  NA 132*  K 4.2  CL 96*  CO2 24  BUN 39*  CREATININE 2.19*  CALCIUM 9.5  GLUCOSE 149*   Lab Results  Component Value Date   TROPONINI <0.03 09/17/2018    Lab Results  Component Value Date   CHOL 125 06/14/2021   CHOL 139 10/26/2019   Lab Results  Component Value Date   HDL 45 06/14/2021   HDL 34 (L) 10/26/2019   Lab Results  Component Value Date   LDLCALC 71 06/14/2021   LDLCALC 88 10/26/2019   Lab Results  Component Value Date   TRIG 45 06/14/2021   TRIG 87 10/26/2019   TRIG 130 04/30/2018   Lab Results  Component Value Date   CHOLHDL 2.8 06/14/2021   CHOLHDL 4.1 10/26/2019   No results found for: LDLDIRECT    Radiology: DG Chest 2 View  Result Date: 07/13/2021 CLINICAL DATA:  Weakness, epistaxis, somnolent, recent pneumonia EXAM: CHEST - 2 VIEW COMPARISON:  06/12/2021 FINDINGS: Frontal and lateral views of the chest demonstrate stable dual lead pacer/AICD. Cardiac silhouette is unremarkable. No acute airspace disease, effusion, or pneumothorax. There are no acute bony abnormalities. IMPRESSION: 1. No acute intrathoracic process. Electronically Signed   By: Randa Ngo M.D.   On: 07/13/2021 16:13   DG Chest Portable 1 View  Result Date: 08/10/2021 CLINICAL DATA:  Shortness of breath EXAM: PORTABLE CHEST 1 VIEW COMPARISON:  07/13/2021 FINDINGS: Diffuse interstitial coarsening and increased hazy density of the chest. No Kerley lines or pleural fluid. No pneumothorax. Normal heart size. Mild aortic tortuosity. Dual-chamber pacer leads/ICD leads from the left. Extensive right coronary stenting. IMPRESSION: Symmetric interstitial opacity which could be bronchitis or failure. Electronically Signed   By: Jorje Guild M.D.   On: 08/10/2021 04:52    ECHOCARDIOGRAM COMPLETE  Result Date: 08/10/2021    ECHOCARDIOGRAM REPORT   Patient Name:   Matthew Zamora Date of Exam: 08/10/2021 Medical Rec #:  [id number]           Height:       68.0 in Accession #:    [id number]          Weight:       149.9 lb Date of Birth:  1941-06-17           BSA:          1.808 m Patient Age:    50 years            BP:  123/68 mmHg Patient Gender: M                   HR:           107 bpm. Exam Location:  [facility] Procedure: 2D Echo, Cardiac Doppler and Color Doppler Indications:     P59.16 Acute Systolic Heart Failure.  History:         Patient has prior history of Echocardiogram examinations, most                  recent 06/13/2021. CHF, CAD, Arrythmias:Atrial Fibrillation;                  Risk Factors:Hypertension and Dyslipidemia.  Sonographer:     Cresenciano Lick RDCS Referring Phys:  [id number] Soledad Gerlach NIU Diagnosing Phys: Yolonda Kida MD IMPRESSIONS  1. Left ventricular ejection fraction, by estimation, is 35 to 40%. The left ventricle has normal function. The left ventricle demonstrates regional wall motion abnormalities (see scoring diagram/findings for description). Left ventricular diastolic parameters are consistent with Grade II diastolic dysfunction (pseudonormalization).  2. Right ventricular systolic function is normal. The right ventricular size is normal.  3. The mitral valve is normal in structure. No evidence of mitral valve regurgitation.  4. The aortic valve is normal in structure. Aortic valve regurgitation is not visualized. FINDINGS  Left Ventricle: Left ventricular ejection fraction, by estimation, is 35 to 40%. The left ventricle has normal function. The left ventricle demonstrates regional wall motion abnormalities. The left ventricular internal cavity size was normal in size. There is borderline left ventricular hypertrophy. Left ventricular diastolic parameters are consistent with Grade II diastolic dysfunction (pseudonormalization). Right  Ventricle: The right ventricular size is normal. No increase in right ventricular wall thickness. Right ventricular systolic function is normal. Left Atrium: Left atrial size was normal in size. Right Atrium: Right atrial size was normal in size. Pericardium: There is no evidence of pericardial effusion. Mitral Valve: The mitral valve is normal in structure. No evidence of mitral valve regurgitation. Tricuspid Valve: The tricuspid valve is normal in structure. Tricuspid valve regurgitation is not demonstrated. Aortic Valve: The aortic valve is normal in structure. Aortic valve regurgitation is not visualized. Pulmonic Valve: The pulmonic valve was grossly normal. Pulmonic valve regurgitation is not visualized. Aorta: The ascending aorta was not well visualized. IAS/Shunts: No atrial level shunt detected by color flow Doppler.  LEFT VENTRICLE PLAX 2D LVIDd:         4.70 cm   Diastology LVIDs:         3.90 cm   LV e' medial:    7.07 cm/s LV PW:         1.00 cm   LV E/e' medial:  14.6 LV IVS:        1.20 cm   LV e' lateral:   6.53 cm/s LVOT diam:     2.10 cm   LV E/e' lateral: 15.8 LV SV:         66 LV SV Index:   36 LVOT Area:     3.46 cm  RIGHT VENTRICLE             IVC RV Basal diam:  3.70 cm     IVC diam: 1.10 cm RV S prime:     15.10 cm/s TAPSE (M-mode): 2.6 cm LEFT ATRIUM             Index        RIGHT ATRIUM  Index LA diam:        5.40 cm 2.99 cm/m   RA Area:     15.30 cm LA Vol (A2C):   93.2 ml 51.55 ml/m  RA Volume:   39.60 ml  21.90 ml/m LA Vol (A4C):   79.4 ml 43.91 ml/m LA Biplane Vol: 86.1 ml 47.62 ml/m  AORTIC VALVE LVOT Vmax:   78.55 cm/s LVOT Vmean:  52.250 cm/s LVOT VTI:    0.189 m  AORTA Ao Root diam: 3.90 cm Ao Asc diam:  3.90 cm MITRAL VALVE MV Area (PHT): 3.85 cm     SHUNTS MV Decel Time: 197 msec     Systemic VTI:  0.19 m MV E velocity: 103.00 cm/s  Systemic Diam: 2.10 cm MV A velocity: 78.80 cm/s MV E/A ratio:  1.31 Matthew Mickley D Bartt Gonzaga MD Electronically signed by Yolonda Kida  MD Signature Date/Time: 08/10/2021/1:32:18 PM    Final     EKG: Normal sinus rhythm prolonged PR nonspecific ST-T wave changes  Echo suggests mild to moderately depressed left ventricular function around 35 to 40% with mild left ventricular enlargement pacer lead in place  ASSESSMENT AND PLAN:  Shortness of breath COPD Chronic hypoxemia Paroxysmal atrial fibrillation Hypertension Hyperlipidemia Coronary artery disease Chronic renal insufficiency stage III GERD History of AAA  Plan SOB most likely from COPD not CHF. Recommend respiratory therapy Hypertension continue current medical therapy Agree with supplimental 02 for hypoxemia SOB Continue telemetry for heart monitoring patient has AICD pacer will consider interrogation or referral back to [facility] upon discharge Paroxysmal atrial fibrillation continue amiodarone for rhythm metoprolol for rate Eliquis for anticoagulation Maintain patient on Lipitor therapy for lipid management Continue aggressive respiratory support steroid taper possibly antibiotics but intensive nebulizers and inhalers Continue IV Lasix therapy for diuresis BNP is not severely elevated for the suggested means mostly pulmonary Agree with Protonix therapy for reflux type symptoms Consider nephrology input for renal insufficiency Ventricular rhythms on mexiletine AICD in place appears to be reasonably stable Do not recommend any invasive cardiac studies    Signed: Yolonda Kida MD 08/11/2021, 10:43 AM

## 2021-08-11 NOTE — Progress Notes (Signed)
PROGRESS NOTE    Matthew Zamora  MGQ:[id number] DOB: 04/29/1941 DOA: 08/10/2021 PCP: [place] Pink, MD   Assessment & Plan:   Principal Problem:   Acute on chronic combined systolic (congestive) and diastolic (congestive) heart failure ([name]) Active Problems:   Acute on chronic respiratory failure with hypoxia ([name])   Paroxysmal atrial fibrillation ([name])   Essential hypertension   Hyperlipidemia   CAD (coronary artery disease)   CKD (chronic kidney disease), stage IIIb   COPD exacerbation ([name])   Acute on chronic hypoxic respiratory failure: secondary to COPD & CHF exacerbation. Continue on supplemental oxygen and wean back to baseline as tolerated. Continue on bronchodilators & IV lasix. Encourage incentive spirometry    Acute on chronic combined CHF exacerbation: echo on 06/13/2021 showed EF of 40-45% with grade 1 diastolic dysfunction. Repeat echo shows EF 35-40%, grade II diastolic dysfunction. Continue on IV lasix. Monitor I/Os and daily weights. Cardio consulted    COPD exacerbation: continue on IV steroids, bronchodilators and encourage incentive spirometry    PAF: continue on metoprolol, amiodarone, mexiletine, & eliquis    HTN: continue on metoprolol. IV hydralazine prn    HLD: continue on statin    Hx of CAD: continue on aspirin, statin    CKDIIIb:  baseline Cr around 2.14.   DVT prophylaxis: eliquis  Code Status: full  Family Communication:  Disposition Plan: likely d/c back home   Level of care: Telemetry Medical  Status is: Inpatient Remains inpatient appropriate because: on IV lasix & IV steroids, can likely d/c home tomorrow    Consultants:  Cardio   Procedures:   Antimicrobials:   Subjective: Pt c/o shortness of breath, improved from day prior   Objective: Vitals:   08/11/21 0400 08/11/21 0500 08/11/21 0600 08/11/21 0745  BP: 107/67  123/76 122/80  Pulse: 60 81 88 60  Resp: 17 15 17 12   Temp:    98.1 F (36.7 C)  TempSrc:    Oral   SpO2: 98% 97% 95% 99%  Weight:      Height:        Intake/Output Summary (Last 24 hours) at 08/11/2021 0831 Last data filed at 08/11/2021 0550 Gross per 24 hour  Intake 240 ml  Output 1400 ml  Net -1160 ml   Filed Weights   08/10/21 0416  Weight: 68 kg    Examination:  General exam: Appears calm and comfortable  Respiratory system: diminished breath sounds b/l  Cardiovascular system: S1 & S2 +. No rubs, gallops or clicks.  Gastrointestinal system: Abdomen is nondistended, soft and nontender. Normal bowel sounds heard. Central nervous system: Alert and oriented. Moves all extremities. Psychiatry: Judgement and insight appear normal. Appropriate mood and affect.     Data Reviewed: I have personally reviewed following labs and imaging studies  CBC: Recent Labs  Lab 08/10/21 0426  WBC 6.7  HGB 9.9*  HCT 31.4*  MCV 95.7  PLT 267   Basic Metabolic Panel: Recent Labs  Lab 08/10/21 0426 08/10/21 0726 08/11/21 0622  NA 134*  --  132*  K 4.3  --  4.2  CL 105  --  96*  CO2 25  --  24  GLUCOSE 107*  --  149*  BUN 19  --  39*  CREATININE 2.07*  --  2.19*  CALCIUM 9.0  --  9.5  MG  --  2.0 2.1   GFR: Estimated Creatinine Clearance: 26.3 mL/min (A) (by C-G formula based on SCr of 2.19 mg/dL (H)). Liver Function  Tests: No results for input(s): AST, ALT, ALKPHOS, BILITOT, PROT, ALBUMIN in the last 168 hours. No results for input(s): LIPASE, AMYLASE in the last 168 hours. No results for input(s): AMMONIA in the last 168 hours. Coagulation Profile: No results for input(s): INR, PROTIME in the last 168 hours. Cardiac Enzymes: No results for input(s): CKTOTAL, CKMB, CKMBINDEX, TROPONINI in the last 168 hours. BNP (last 3 results) No results for input(s): PROBNP in the last 8760 hours. HbA1C: No results for input(s): HGBA1C in the last 72 hours. CBG: No results for input(s): GLUCAP in the last 168 hours. Lipid Profile: No results for input(s): CHOL, HDL, LDLCALC,  TRIG, CHOLHDL, LDLDIRECT in the last 72 hours. Thyroid Function Tests: No results for input(s): TSH, T4TOTAL, FREET4, T3FREE, THYROIDAB in the last 72 hours. Anemia Panel: No results for input(s): VITAMINB12, FOLATE, FERRITIN, TIBC, IRON, RETICCTPCT in the last 72 hours. Sepsis Labs: No results for input(s): PROCALCITON, LATICACIDVEN in the last 168 hours.  Recent Results (from the past 240 hour(s))  Resp Panel by RT-PCR (Flu A&B, Covid) Nasopharyngeal Swab     Status: None   Collection Time: 08/10/21  4:26 AM   Specimen: Nasopharyngeal Swab; Nasopharyngeal(NP) swabs in vial transport medium  Result Value Ref Range Status   SARS Coronavirus 2 by RT PCR NEGATIVE NEGATIVE Final    Comment: (NOTE) SARS-CoV-2 target nucleic acids are NOT DETECTED.  The SARS-CoV-2 RNA is generally detectable in upper respiratory specimens during the acute phase of infection. The lowest concentration of SARS-CoV-2 viral copies this assay can detect is 138 copies/mL. A negative result does not preclude SARS-Cov-2 infection and should not be used as the sole basis for treatment or other patient management decisions. A negative result may occur with  improper specimen collection/handling, submission of specimen other than nasopharyngeal swab, presence of viral mutation(s) within the areas targeted by this assay, and inadequate number of viral copies(<138 copies/mL). A negative result must be combined with clinical observations, patient history, and epidemiological information. The expected result is Negative.  Fact Sheet for Patients:  EntrepreneurPulse.com.au  Fact Sheet for Healthcare Providers:  IncredibleEmployment.be  This test is no t yet approved or cleared by the [place] FDA and  has been authorized for detection and/or diagnosis of SARS-CoV-2 by FDA under an Emergency Use Authorization (EUA). This EUA will remain  in effect (meaning this test can be  used) for the duration of the COVID-19 declaration under Section 564(b)(1) of the Act, 21 U.S.C.section 360bbb-3(b)(1), unless the authorization is terminated  or revoked sooner.       Influenza A by PCR NEGATIVE NEGATIVE Final   Influenza B by PCR NEGATIVE NEGATIVE Final    Comment: (NOTE) The Xpert Xpress SARS-CoV-2/FLU/RSV plus assay is intended as an aid in the diagnosis of influenza from Nasopharyngeal swab specimens and should not be used as a sole basis for treatment. Nasal washings and aspirates are unacceptable for Xpert Xpress SARS-CoV-2/FLU/RSV testing.  Fact Sheet for Patients: EntrepreneurPulse.com.au  Fact Sheet for Healthcare Providers: IncredibleEmployment.be  This test is not yet approved or cleared by the [place] FDA and has been authorized for detection and/or diagnosis of SARS-CoV-2 by FDA under an Emergency Use Authorization (EUA). This EUA will remain in effect (meaning this test can be used) for the duration of the COVID-19 declaration under Section 564(b)(1) of the Act, 21 U.S.C. section 360bbb-3(b)(1), unless the authorization is terminated or revoked.  Performed at [facility], [address] [place], [place] [address]  Radiology Studies: DG Chest Portable 1 View  Result Date: 08/10/2021 CLINICAL DATA:  Shortness of breath EXAM: PORTABLE CHEST 1 VIEW COMPARISON:  07/13/2021 FINDINGS: Diffuse interstitial coarsening and increased hazy density of the chest. No Kerley lines or pleural fluid. No pneumothorax. Normal heart size. Mild aortic tortuosity. Dual-chamber pacer leads/ICD leads from the left. Extensive right coronary stenting. IMPRESSION: Symmetric interstitial opacity which could be bronchitis or failure. Electronically Signed   By: Jorje Guild M.D.   On: 08/10/2021 04:52   ECHOCARDIOGRAM COMPLETE  Result Date: 08/10/2021    ECHOCARDIOGRAM REPORT   Patient Name:   Matthew Zamora Date of Exam: 08/10/2021 Medical Rec #:  [id number]           Height:       68.0 in Accession #:    [id number]          Weight:       149.9 lb Date of Birth:  Jun 01, 1941           BSA:          1.808 m Patient Age:    17 years            BP:           123/68 mmHg Patient Gender: M                   HR:           107 bpm. Exam Location:  [facility] Procedure: 2D Echo, Cardiac Doppler and Color Doppler Indications:     L46.50 Acute Systolic Heart Failure.  History:         Patient has prior history of Echocardiogram examinations, most                  recent 06/13/2021. CHF, CAD, Arrythmias:Atrial Fibrillation;                  Risk Factors:Hypertension and Dyslipidemia.  Sonographer:     Cresenciano Lick RDCS Referring Phys:  [id number] Soledad Gerlach NIU Diagnosing Phys: Yolonda Kida MD IMPRESSIONS  1. Left ventricular ejection fraction, by estimation, is 35 to 40%. The left ventricle has normal function. The left ventricle demonstrates regional wall motion abnormalities (see scoring diagram/findings for description). Left ventricular diastolic parameters are consistent with Grade II diastolic dysfunction (pseudonormalization).  2. Right ventricular systolic function is normal. The right ventricular size is normal.  3. The mitral valve is normal in structure. No evidence of mitral valve regurgitation.  4. The aortic valve is normal in structure. Aortic valve regurgitation is not visualized. FINDINGS  Left Ventricle: Left ventricular ejection fraction, by estimation, is 35 to 40%. The left ventricle has normal function. The left ventricle demonstrates regional wall motion abnormalities. The left ventricular internal cavity size was normal in size. There is borderline left ventricular hypertrophy. Left ventricular diastolic parameters are consistent with Grade II diastolic dysfunction (pseudonormalization). Right Ventricle: The right ventricular size is normal. No increase in right ventricular wall thickness. Right  ventricular systolic function is normal. Left Atrium: Left atrial size was normal in size. Right Atrium: Right atrial size was normal in size. Pericardium: There is no evidence of pericardial effusion. Mitral Valve: The mitral valve is normal in structure. No evidence of mitral valve regurgitation. Tricuspid Valve: The tricuspid valve is normal in structure. Tricuspid valve regurgitation is not demonstrated. Aortic Valve: The aortic valve is normal in structure. Aortic valve regurgitation is not visualized. Pulmonic Valve: The pulmonic valve was  grossly normal. Pulmonic valve regurgitation is not visualized. Aorta: The ascending aorta was not well visualized. IAS/Shunts: No atrial level shunt detected by color flow Doppler.  LEFT VENTRICLE PLAX 2D LVIDd:         4.70 cm   Diastology LVIDs:         3.90 cm   LV e' medial:    7.07 cm/s LV PW:         1.00 cm   LV E/e' medial:  14.6 LV IVS:        1.20 cm   LV e' lateral:   6.53 cm/s LVOT diam:     2.10 cm   LV E/e' lateral: 15.8 LV SV:         66 LV SV Index:   36 LVOT Area:     3.46 cm  RIGHT VENTRICLE             IVC RV Basal diam:  3.70 cm     IVC diam: 1.10 cm RV S prime:     15.10 cm/s TAPSE (M-mode): 2.6 cm LEFT ATRIUM             Index        RIGHT ATRIUM           Index LA diam:        5.40 cm 2.99 cm/m   RA Area:     15.30 cm LA Vol (A2C):   93.2 ml 51.55 ml/m  RA Volume:   39.60 ml  21.90 ml/m LA Vol (A4C):   79.4 ml 43.91 ml/m LA Biplane Vol: 86.1 ml 47.62 ml/m  AORTIC VALVE LVOT Vmax:   78.55 cm/s LVOT Vmean:  52.250 cm/s LVOT VTI:    0.189 m  AORTA Ao Root diam: 3.90 cm Ao Asc diam:  3.90 cm MITRAL VALVE MV Area (PHT): 3.85 cm     SHUNTS MV Decel Time: 197 msec     Systemic VTI:  0.19 m MV E velocity: 103.00 cm/s  Systemic Diam: 2.10 cm MV A velocity: 78.80 cm/s MV E/A ratio:  1.31 Dwayne D Callwood MD Electronically signed by Yolonda Kida MD Signature Date/Time: 08/10/2021/1:32:18 PM    Final         Scheduled Meds:  amiodarone  400 mg  Oral Daily   apixaban  5 mg Oral BID   aspirin  81 mg Oral Q1200   atorvastatin  80 mg Oral Daily   furosemide  40 mg Intravenous Q12H   gabapentin  100 mg Oral QHS   ipratropium-albuterol  3 mL Nebulization Q4H   magnesium oxide  400 mg Oral Daily   melatonin  10 mg Oral QHS   methylPREDNISolone (SOLU-MEDROL) injection  40 mg Intravenous Q12H   metoprolol succinate  75 mg Oral BID   mexiletine  150 mg Oral Q8H   mometasone-formoterol  2 puff Inhalation BID   multivitamin with minerals  1 tablet Oral Daily   pantoprazole  40 mg Oral Daily   tamsulosin  0.4 mg Oral Daily   Continuous Infusions:   LOS: 1 day    Time spent: 33 mins    Wyvonnia Dusky, MD [facility] Pager [phone]  If 7PM-7AM, please contact night-coverage 08/11/2021, 8:31 AM

## 2021-08-12 LAB — BASIC METABOLIC PANEL
Anion gap: 9 (ref 5–15)
BUN: 49 mg/dL — ABNORMAL HIGH (ref 8–23)
CO2: 26 mmol/L (ref 22–32)
Calcium: 9 mg/dL (ref 8.9–10.3)
Chloride: 97 mmol/L — ABNORMAL LOW (ref 98–111)
Creatinine, Ser: 2.54 mg/dL — ABNORMAL HIGH (ref 0.61–1.24)
GFR, Estimated: 25 mL/min — ABNORMAL LOW (ref >60)
Glucose, Bld: 141 mg/dL — ABNORMAL HIGH (ref 70–99)
Potassium: 4.2 mmol/L (ref 3.5–5.1)
Sodium: 132 mmol/L — ABNORMAL LOW (ref 135–145)

## 2021-08-12 LAB — CBC
HCT: 31 % — ABNORMAL LOW (ref 39.0–52.0)
Hemoglobin: 10 g/dL — ABNORMAL LOW (ref 13.0–17.0)
MCH: 30 pg (ref 26.0–34.0)
MCHC: 32.3 g/dL (ref 30.0–36.0)
MCV: 93.1 fL (ref 80.0–100.0)
Platelets: 181 10*3/uL (ref 150–400)
RBC: 3.33 MIL/uL — ABNORMAL LOW (ref 4.22–5.81)
RDW: 14.6 % (ref 11.5–15.5)
WBC: 11.5 10*3/uL — ABNORMAL HIGH (ref 4.0–10.5)
nRBC: 0 % (ref 0.0–0.2)

## 2021-08-12 MED ORDER — TORSEMIDE 20 MG PO TABS: 20.0000 mg | ORAL_TABLET | Freq: Every day | ORAL | 0 refills | Status: DC

## 2021-08-12 MED ORDER — FUROSEMIDE 10 MG/ML IJ SOLN
40.0000 mg | Freq: Every day | INTRAMUSCULAR | Status: DC
Start: 2021-08-13 — End: 2021-08-12

## 2021-08-12 MED ORDER — PREDNISONE 20 MG PO TABS
40.0000 mg | ORAL_TABLET | Freq: Every day | ORAL | 0 refills | Status: AC
Start: 2021-08-12 — End: 2021-08-17

## 2021-08-12 NOTE — Evaluation (Signed)
Physical Therapy Evaluation Patient Details Name: Matthew Zamora MRN: [id number] DOB: 1940-10-25 Today's Date: 08/12/2021  History of Present Illness  Pt is a 79 M admitted on 08/10/21 after presenting with c/o SOB. Pt is being treated for acute on chronic hypoxic respiratory failure 2/2 to COPD & CHF. PMH: CHF with EF of 40-45%, HTN, HLD, COPD on 2L PRN, GERD, prostate CA, PAF, chronic hoarseness, CAD, anemia, AAA, hepatitis, CKD-3B, pacemaker, pancreatic insufficiency  Clinical Impression  Pt seen for PT evaluation with pt reporting prior to admission he was ambulatory without AD, denies falls, but daughter in law provides breakfast & dinner daily. On this date, pt is independent with bed mobility & dons pants from bed level & shoes sitting EOB independently. Pt ambulates 2 laps around nurses station with slightly decreased gait speed but good balance overall. At this time, pt appears to be at baseline level of mobility & does not require any acute or f/u PT services. PT to sign off at this time.      Recommendations for follow up therapy are one component of a multi-disciplinary discharge planning process, led by the attending physician.  Recommendations may be updated based on patient status, additional functional criteria and insurance authorization.  Follow Up Recommendations No PT follow up    Assistance Recommended at Discharge PRN  Patient can return home with the following       Equipment Recommendations None recommended by PT  Recommendations for Other Services       Functional Status Assessment Patient has not had a recent decline in their functional status     Precautions / Restrictions Precautions Precautions: None Restrictions Weight Bearing Restrictions: No      Mobility  Bed Mobility Overal bed mobility: Independent                  Transfers Overall transfer level: Independent Equipment used: None                     Ambulation/Gait Ambulation/Gait assistance: Independent Gait Distance (Feet): 300 Feet Assistive device: None Gait Pattern/deviations: Decreased step length - right, Decreased step length - left, Decreased stride length Gait velocity: slightly decreased        Stairs            Wheelchair Mobility    Modified Rankin (Stroke Patients Only)       Balance Overall balance assessment: Needs assistance Sitting-balance support: Feet supported, No upper extremity supported Sitting balance-Leahy Scale: Normal     Standing balance support: During functional activity, No upper extremity supported Standing balance-Leahy Scale: Good                               Pertinent Vitals/Pain Pain Assessment Pain Assessment: No/denies pain    Home Living Family/patient expects to be discharged to:: Private residence Living Arrangements: Alone Available Help at Discharge: Family;Available PRN/intermittently Type of Home: Mobile home Home Access: Ramped entrance         Home Equipment: Rollator (4 wheels);Rolling Walker (2 wheels);Cane - single point, manual w/c      Prior Function Prior Level of Function : Independent/Modified Independent             Mobility Comments: Pt reports independence with mobility without AD, can feed neighbor's dogs ADLs Comments: Daughter-in-law brings him breakfast & dinner daily     Hand Dominance        Extremity/Trunk  Assessment   Upper Extremity Assessment Upper Extremity Assessment: Overall WFL for tasks assessed    Lower Extremity Assessment Lower Extremity Assessment: Overall WFL for tasks assessed    Cervical / Trunk Assessment Cervical / Trunk Assessment:  (rounded shoulders)  Communication   Communication: No difficulties  Cognition Arousal/Alertness: Awake/alert Behavior During Therapy: WFL for tasks assessed/performed, Impulsive Overall Cognitive Status: Within Functional Limits for tasks assessed                                           General Comments General comments (skin integrity, edema, etc.): SPO2 >90% on room air but pt reports he does have to slow down while ambulating 2/2 breathing    Exercises     Assessment/Plan    PT Assessment Patient does not need any further PT services  PT Problem List         PT Treatment Interventions      PT Goals (Current goals can be found in the Care Plan section)  Acute Rehab PT Goals Patient Stated Goal: go home PT Goal Formulation: With patient Time For Goal Achievement: 08/26/21 Potential to Achieve Goals: Good    Frequency       Co-evaluation               AM-PAC PT "6 Clicks" Mobility  Outcome Measure Help needed turning from your back to your side while in a flat bed without using bedrails?: None Help needed moving from lying on your back to sitting on the side of a flat bed without using bedrails?: None Help needed moving to and from a bed to a chair (including a wheelchair)?: None Help needed standing up from a chair using your arms (e.g., wheelchair or bedside chair)?: None Help needed to walk in hospital room?: None Help needed climbing 3-5 steps with a railing? : None 6 Click Score: 24    End of Session   Activity Tolerance: Patient tolerated treatment well Patient left: in chair;with call bell/phone within reach        Time: 0919-0929 PT Time Calculation (min) (ACUTE ONLY): 10 min   Charges:   PT Evaluation $PT Eval Low Complexity: 1 Low          [name], PT, DPT 08/12/21, 9:38 AM   [name] 08/12/2021, 9:36 AM

## 2021-08-12 NOTE — Discharge Summary (Signed)
Physician Discharge Summary  Matthew Zamora ZMO:[id number] DOB: 1940-11-08 DOA: 08/10/2021  PCP: Maryland Pink, MD  Admit date: 08/10/2021 Discharge date: 08/12/2021  Admitted From: home  Disposition:  home   Recommendations for Outpatient Follow-up:  Follow up with PCP in 1-2 weeks F/u w/ cardio in 1-2 weeks   Home Health: no Equipment/Devices:  Discharge Condition: stable  CODE STATUS: full  Diet recommendation: Heart Healthy  Brief/Interim Summary: HPI was taken from Dr. Blaine Hamper: Matthew Zamora is a 81 y.o. 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   Patient states that his shortness breath has progressively been worsening since yesterday.  He has dry cough, wheezing, no chest pain.  No fever or chills.  He states that he used 2 L oxygen as needed at home, but now he needs 2 L oxygen constantly.  No nausea, vomiting, diarrhea or abdominal pain with no symptoms of UTI.   Data Reviewed and ED Course: pt was found to have BNP 411, troponin level 14 --> 15, negative COVID PCR, kidney function close to baseline, temperature normal, blood pressure 162/89, 118/71, heart rate 83, RR 18.  Chest x-ray showed interstitial pulmonary edema.  Patient is admitted to telemetry bed as inpatient.   EKG: I have personally reviewed.  Sinus rhythm, QTc 465, low voltage, left bundle blockade, early R wave progression, mild ST depression in V4-V6, I do not see pacer markers on EKG.  As per Dr. Jimmye Norman 2/25-2/26/23: Pt presented w/ shortness of breath likely secondary to CHF & COPD exacerbation. Pt was treated w/ IV lasix, metoprolol, & statin for CHF exacerbation. Repeat echo showed EF 35-40%, grade II diastolic dysfunction. Pt will f/u  outpatient w/ cardiology and pt verbalized his understanding. Pt was given steroids, bronchodilators & incentive spirometry for COPD exacerbation. Pt only required supplemental oxygen in the ER for respiratory distress which resolved prior to d/c.   Discharge Diagnoses:  Principal Problem:   Acute on chronic combined systolic (congestive) and diastolic (congestive) heart failure ([name]) Active Problems:   Acute on chronic respiratory failure with hypoxia ([name])   Paroxysmal atrial fibrillation ([name])   Essential hypertension   Hyperlipidemia   CAD (coronary artery disease)   CKD (chronic kidney disease), stage IIIb   COPD exacerbation ([name])  Acute on chronic hypoxic respiratory failure: secondary to COPD & CHF exacerbation. Continue on supplemental oxygen and wean back to baseline as tolerated. Continue on bronchodilators & IV lasix. Encourage incentive spirometry    Acute on chronic combined CHF exacerbation: echo on 06/13/2021 showed EF of 40-45% with grade 1 diastolic dysfunction. Repeat echo shows EF 35-40%, grade II diastolic dysfunction. Continue on IV lasix. Monitor I/Os and daily weights. Cardio consulted    COPD exacerbation: continue on IV steroids, bronchodilators and encourage incentive spirometry    PAF: continue on metoprolol, amiodarone, mexiletine, & eliquis    HTN: continue on metoprolol. IV hydralazine prn    HLD: continue on statin    Hx of CAD: continue on aspirin, statin    CKDIIIb:  baseline Cr around 2.14.   Discharge Instructions  Discharge Instructions     Diet - low sodium heart healthy   Complete by: As directed    Discharge instructions   Complete by: As directed    F/u w/ cardio  in 1-2 weeks. F/u w/ PCP in 1-2 weeks. Fluid restriction of 64 ounces which is includes water tea coffee juice sodas and anything that melts at room temperature. Instructed on weighing first thing in the morning after going to the bathroom, before eating or drinking anything and  recording.  Then to reported 2 to 3 pound weight gain overnight or 5 pounds within the week.   Increase activity slowly   Complete by: As directed       Allergies as of 08/12/2021   No Known Allergies      Medication List     STOP taking these medications    HYDROcodone-acetaminophen 5-325 MG tablet Commonly known as: NORCO/VICODIN   magnesium chloride 64 MG Tbec SR tablet Commonly known as: SLOW-MAG       TAKE these medications    acetaminophen 325 MG tablet Commonly known as: TYLENOL Take 2 tablets (650 mg total) by mouth every 4 (four) hours as needed for headache or mild pain.   albuterol (2.5 MG/3ML) 0.083% nebulizer solution Commonly known as: PROVENTIL Inhale 3 mLs (2.5 mg total) into the lungs as needed for shortness of breath.   albuterol 108 (90 Base) MCG/ACT inhaler Commonly known as: VENTOLIN HFA Inhale 2 puffs into the lungs every 4 (four) hours as needed.   amiodarone 400 MG tablet Commonly known as: PACERONE Take 1 tablet (400 mg total) by mouth daily.   aspirin 81 MG chewable tablet Chew 81 mg by mouth daily at 12 noon.   atorvastatin 80 MG tablet Commonly known as: LIPITOR Take 80 mg by mouth daily.   benzonatate 100 MG capsule Commonly known as: Tessalon Perles Take 1 capsule (100 mg total) by mouth every 6 (six) hours as needed for cough.   dextromethorphan-guaiFENesin 30-600 MG 12hr tablet Commonly known as: MUCINEX DM Take 1 tablet by mouth 2 (two) times daily.   Eliquis 5 MG Tabs tablet Generic drug: apixaban Take 5 mg by mouth 2 (two) times daily.   Fluticasone-Salmeterol 500-50 MCG/DOSE Aepb Commonly known as: ADVAIR Inhale 1 puff into the lungs 2 (two) times daily.   gabapentin 100 MG capsule Commonly known as: NEURONTIN Take 100 mg by mouth at bedtime.   ipratropium-albuterol 0.5-2.5 (3) MG/3ML Soln Commonly known as: DUONEB Take 3 mLs by nebulization every 4 (four) hours as needed.   magnesium oxide 400 (240 Mg) MG  tablet Commonly known as: MAG-OX Take 1 tablet by mouth daily.   melatonin 3 MG Tabs tablet Take 9 mg by mouth at bedtime.   melatonin 5 MG Tabs Take 0.5 tablets (2.5 mg total) by mouth at bedtime as needed (sleep).   metoprolol succinate 50 MG 24 hr tablet Commonly known as: TOPROL-XL Take 75 mg by mouth 2 (two) times daily.   mexiletine 150 MG capsule Commonly known as: MEXITIL Take 150 mg by mouth every 8 (eight) hours.   multivitamin with minerals Tabs tablet Take 1 tablet by mouth daily.   Nebulizer Misc 1 each by Does not apply route as needed.   omeprazole 20 MG capsule Commonly known as: PRILOSEC Take 20 mg by mouth every morning.   oxymetazoline 0.05 % nasal spray Commonly known as: AFRIN Place 2 sprays into both nostrils 2 (two) times daily as needed for congestion (nose bleeds).   predniSONE 20 MG tablet Commonly known as: DELTASONE Take 2 tablets (40 mg total) by mouth daily with breakfast for 5 days. What changed:  medication strength how much to take how to take  this when to take this additional instructions   tamsulosin 0.4 MG Caps capsule Commonly known as: FLOMAX Take 0.4 mg by mouth daily.   torsemide 20 MG tablet Commonly known as: DEMADEX Take 1 tablet (20 mg total) by mouth daily.        No Known Allergies  Consultations: Cardio: Dr. Clayborn Bigness   Procedures/Studies: DG Chest 2 View  Result Date: 07/13/2021 CLINICAL DATA:  Weakness, epistaxis, somnolent, recent pneumonia EXAM: CHEST - 2 VIEW COMPARISON:  06/12/2021 FINDINGS: Frontal and lateral views of the chest demonstrate stable dual lead pacer/AICD. Cardiac silhouette is unremarkable. No acute airspace disease, effusion, or pneumothorax. There are no acute bony abnormalities. IMPRESSION: 1. No acute intrathoracic process. Electronically Signed   By: Randa Ngo M.D.   On: 07/13/2021 16:13   DG Chest Portable 1 View  Result Date: 08/10/2021 CLINICAL DATA:  Shortness of breath  EXAM: PORTABLE CHEST 1 VIEW COMPARISON:  07/13/2021 FINDINGS: Diffuse interstitial coarsening and increased hazy density of the chest. No Kerley lines or pleural fluid. No pneumothorax. Normal heart size. Mild aortic tortuosity. Dual-chamber pacer leads/ICD leads from the left. Extensive right coronary stenting. IMPRESSION: Symmetric interstitial opacity which could be bronchitis or failure. Electronically Signed   By: Jorje Guild M.D.   On: 08/10/2021 04:52   ECHOCARDIOGRAM COMPLETE  Result Date: 08/10/2021    ECHOCARDIOGRAM REPORT   Patient Name:   Matthew Zamora Date of Exam: 08/10/2021 Medical Rec #:  [id number]           Height:       68.0 in Accession #:    [id number]          Weight:       149.9 lb Date of Birth:  [birth year]           BSA:          1.808 m Patient Age:    81 years            BP:           123/68 mmHg Patient Gender: M                   HR:           107 bpm. Exam Location:  [facility] Procedure: 2D Echo, Cardiac Doppler and Color Doppler Indications:     J49.70 Acute Systolic Heart Failure.  History:         Patient has prior history of Echocardiogram examinations, most                  recent 06/13/2021. CHF, CAD, Arrythmias:Atrial Fibrillation;                  Risk Factors:Hypertension and Dyslipidemia.  Sonographer:     Cresenciano Lick RDCS Referring Phys:  [id number] Soledad Gerlach NIU Diagnosing Phys: Yolonda Kida MD IMPRESSIONS  1. Left ventricular ejection fraction, by estimation, is 35 to 40%. The left ventricle has normal function. The left ventricle demonstrates regional wall motion abnormalities (see scoring diagram/findings for description). Left ventricular diastolic parameters are consistent with Grade II diastolic dysfunction (pseudonormalization).  2. Right ventricular systolic function is normal. The right ventricular size is normal.  3. The mitral valve is normal in structure. No evidence of mitral valve regurgitation.  4. The aortic valve is normal in structure. Aortic  valve regurgitation is not visualized. FINDINGS  Left Ventricle: Left ventricular ejection fraction, by estimation, is 35 to 40%. The left ventricle has normal  function. The left ventricle demonstrates regional wall motion abnormalities. The left ventricular internal cavity size was normal in size. There is borderline left ventricular hypertrophy. Left ventricular diastolic parameters are consistent with Grade II diastolic dysfunction (pseudonormalization). Right Ventricle: The right ventricular size is normal. No increase in right ventricular wall thickness. Right ventricular systolic function is normal. Left Atrium: Left atrial size was normal in size. Right Atrium: Right atrial size was normal in size. Pericardium: There is no evidence of pericardial effusion. Mitral Valve: The mitral valve is normal in structure. No evidence of mitral valve regurgitation. Tricuspid Valve: The tricuspid valve is normal in structure. Tricuspid valve regurgitation is not demonstrated. Aortic Valve: The aortic valve is normal in structure. Aortic valve regurgitation is not visualized. Pulmonic Valve: The pulmonic valve was grossly normal. Pulmonic valve regurgitation is not visualized. Aorta: The ascending aorta was not well visualized. IAS/Shunts: No atrial level shunt detected by color flow Doppler.  LEFT VENTRICLE PLAX 2D LVIDd:         4.70 cm   Diastology LVIDs:         3.90 cm   LV e' medial:    7.07 cm/s LV PW:         1.00 cm   LV E/e' medial:  14.6 LV IVS:        1.20 cm   LV e' lateral:   6.53 cm/s LVOT diam:     2.10 cm   LV E/e' lateral: 15.8 LV SV:         66 LV SV Index:   36 LVOT Area:     3.46 cm  RIGHT VENTRICLE             IVC RV Basal diam:  3.70 cm     IVC diam: 1.10 cm RV S prime:     15.10 cm/s TAPSE (M-mode): 2.6 cm LEFT ATRIUM             Index        RIGHT ATRIUM           Index LA diam:        5.40 cm 2.99 cm/m   RA Area:     15.30 cm LA Vol (A2C):   93.2 ml 51.55 ml/m  RA Volume:   39.60 ml  21.90  ml/m LA Vol (A4C):   79.4 ml 43.91 ml/m LA Biplane Vol: 86.1 ml 47.62 ml/m  AORTIC VALVE LVOT Vmax:   78.55 cm/s LVOT Vmean:  52.250 cm/s LVOT VTI:    0.189 m  AORTA Ao Root diam: 3.90 cm Ao Asc diam:  3.90 cm MITRAL VALVE MV Area (PHT): 3.85 cm     SHUNTS MV Decel Time: 197 msec     Systemic VTI:  0.19 m MV E velocity: 103.00 cm/s  Systemic Diam: 2.10 cm MV A velocity: 78.80 cm/s MV E/A ratio:  1.31 Dwayne D Callwood MD Electronically signed by Yolonda Kida MD Signature Date/Time: 08/10/2021/1:32:18 PM    Final    (Echo, Carotid, EGD, Colonoscopy, ERCP)    Subjective:   Discharge Exam: Vitals:   08/12/21 0751 08/12/21 0856  BP: 106/60 117/65  Pulse: 64 85  Resp: 18   Temp: 98.1 F (36.7 C)   SpO2: 97%    Vitals:   08/12/21 0416 08/12/21 0500 08/12/21 0751 08/12/21 0856  BP: 121/63  106/60 117/65  Pulse: 65  64 85  Resp: 16  18   Temp: 97.6 F (36.4 C)  98.1  F (36.7 C)   TempSrc: Oral  Oral   SpO2: 98%  97%   Weight:  68.9 kg    Height:        General: Pt is alert, awake, not in acute distress Cardiovascular: S1/S2 +, no rubs, no gallops Respiratory: diminished breath sounds b/l otherwise clear  Abdominal: Soft, NT, ND, bowel sounds + Extremities: no edema, no cyanosis    The results of significant diagnostics from this hospitalization (including imaging, microbiology, ancillary and laboratory) are listed below for reference.     Microbiology: Recent Results (from the past 240 hour(s))  Resp Panel by RT-PCR (Flu A&B, Covid) Nasopharyngeal Swab     Status: None   Collection Time: 08/10/21  4:26 AM   Specimen: Nasopharyngeal Swab; Nasopharyngeal(NP) swabs in vial transport medium  Result Value Ref Range Status   SARS Coronavirus 2 by RT PCR NEGATIVE NEGATIVE Final    Comment: (NOTE) SARS-CoV-2 target nucleic acids are NOT DETECTED.  The SARS-CoV-2 RNA is generally detectable in upper respiratory specimens during the acute phase of infection. The  lowest concentration of SARS-CoV-2 viral copies this assay can detect is 138 copies/mL. A negative result does not preclude SARS-Cov-2 infection and should not be used as the sole basis for treatment or other patient management decisions. A negative result may occur with  improper specimen collection/handling, submission of specimen other than nasopharyngeal swab, presence of viral mutation(s) within the areas targeted by this assay, and inadequate number of viral copies(<138 copies/mL). A negative result must be combined with clinical observations, patient history, and epidemiological information. The expected result is Negative.  Fact Sheet for Patients:  EntrepreneurPulse.com.au  Fact Sheet for Healthcare Providers:  IncredibleEmployment.be  This test is no t yet approved or cleared by the [place] FDA and  has been authorized for detection and/or diagnosis of SARS-CoV-2 by FDA under an Emergency Use Authorization (EUA). This EUA will remain  in effect (meaning this test can be used) for the duration of the COVID-19 declaration under Section 564(b)(1) of the Act, 21 U.S.C.section 360bbb-3(b)(1), unless the authorization is terminated  or revoked sooner.       Influenza A by PCR NEGATIVE NEGATIVE Final   Influenza B by PCR NEGATIVE NEGATIVE Final    Comment: (NOTE) The Xpert Xpress SARS-CoV-2/FLU/RSV plus assay is intended as an aid in the diagnosis of influenza from Nasopharyngeal swab specimens and should not be used as a sole basis for treatment. Nasal washings and aspirates are unacceptable for Xpert Xpress SARS-CoV-2/FLU/RSV testing.  Fact Sheet for Patients: EntrepreneurPulse.com.au  Fact Sheet for Healthcare Providers: IncredibleEmployment.be  This test is not yet approved or cleared by the [place] FDA and has been authorized for detection and/or diagnosis of SARS-CoV-2 by FDA under  an Emergency Use Authorization (EUA). This EUA will remain in effect (meaning this test can be used) for the duration of the COVID-19 declaration under Section 564(b)(1) of the Act, 21 U.S.C. section 360bbb-3(b)(1), unless the authorization is terminated or revoked.  Performed at [facility], [address] [place], [place] [address]      Labs: BNP (last 3 results) Recent Labs    06/13/21 0913 08/10/21 0426  BNP 337.8* 355.7*   Basic Metabolic Panel: Recent Labs  Lab 08/10/21 0426 08/10/21 0726 08/11/21 0622 08/12/21 0458  NA 134*  --  132* 132*  K 4.3  --  4.2 4.2  CL 105  --  96* 97*  CO2 25  --  24 26  GLUCOSE 107*  --  149* 141*  BUN 19  --  39* 49*  CREATININE 2.07*  --  2.19* 2.54*  CALCIUM 9.0  --  9.5 9.0  MG  --  2.0 2.1  --    Liver Function Tests: No results for input(s): AST, ALT, ALKPHOS, BILITOT, PROT, ALBUMIN in the last 168 hours. No results for input(s): LIPASE, AMYLASE in the last 168 hours. No results for input(s): AMMONIA in the last 168 hours. CBC: Recent Labs  Lab 08/10/21 0426 08/12/21 0458  WBC 6.7 11.5*  HGB 9.9* 10.0*  HCT 31.4* 31.0*  MCV 95.7 93.1  PLT 171 181   Cardiac Enzymes: No results for input(s): CKTOTAL, CKMB, CKMBINDEX, TROPONINI in the last 168 hours. BNP: Invalid input(s): POCBNP CBG: No results for input(s): GLUCAP in the last 168 hours. D-Dimer No results for input(s): DDIMER in the last 72 hours. Hgb A1c No results for input(s): HGBA1C in the last 72 hours. Lipid Profile No results for input(s): CHOL, HDL, LDLCALC, TRIG, CHOLHDL, LDLDIRECT in the last 72 hours. Thyroid function studies No results for input(s): TSH, T4TOTAL, T3FREE, THYROIDAB in the last 72 hours.  Invalid input(s): FREET3 Anemia work up No results for input(s): VITAMINB12, FOLATE, FERRITIN, TIBC, IRON, RETICCTPCT in the last 72 hours. Urinalysis    Component Value Date/Time   COLORURINE YELLOW (A) 09/17/2018 0833    APPEARANCEUR HAZY (A) 09/17/2018 0833   LABSPEC 1.020 09/17/2018 0833   PHURINE 5.0 09/17/2018 0833   GLUCOSEU NEGATIVE 09/17/2018 0833   HGBUR NEGATIVE 09/17/2018 0833   BILIRUBINUR NEGATIVE 09/17/2018 0833   KETONESUR NEGATIVE 09/17/2018 0833   PROTEINUR NEGATIVE 09/17/2018 0833   NITRITE NEGATIVE 09/17/2018 0833   LEUKOCYTESUR SMALL (A) 09/17/2018 0833   Sepsis Labs Invalid input(s): PROCALCITONIN,  WBC,  LACTICIDVEN Microbiology Recent Results (from the past 240 hour(s))  Resp Panel by RT-PCR (Flu A&B, Covid) Nasopharyngeal Swab     Status: None   Collection Time: 08/10/21  4:26 AM   Specimen: Nasopharyngeal Swab; Nasopharyngeal(NP) swabs in vial transport medium  Result Value Ref Range Status   SARS Coronavirus 2 by RT PCR NEGATIVE NEGATIVE Final    Comment: (NOTE) SARS-CoV-2 target nucleic acids are NOT DETECTED.  The SARS-CoV-2 RNA is generally detectable in upper respiratory specimens during the acute phase of infection. The lowest concentration of SARS-CoV-2 viral copies this assay can detect is 138 copies/mL. A negative result does not preclude SARS-Cov-2 infection and should not be used as the sole basis for treatment or other patient management decisions. A negative result may occur with  improper specimen collection/handling, submission of specimen other than nasopharyngeal swab, presence of viral mutation(s) within the areas targeted by this assay, and inadequate number of viral copies(<138 copies/mL). A negative result must be combined with clinical observations, patient history, and epidemiological information. The expected result is Negative.  Fact Sheet for Patients:  EntrepreneurPulse.com.au  Fact Sheet for Healthcare Providers:  IncredibleEmployment.be  This test is no t yet approved or cleared by the [place] FDA and  has been authorized for detection and/or diagnosis of SARS-CoV-2 by FDA under an Emergency Use  Authorization (EUA). This EUA will remain  in effect (meaning this test can be used) for the duration of the COVID-19 declaration under Section 564(b)(1) of the Act, 21 U.S.C.section 360bbb-3(b)(1), unless the authorization is terminated  or revoked sooner.       Influenza A by PCR NEGATIVE NEGATIVE Final   Influenza B by PCR NEGATIVE NEGATIVE Final  Comment: (NOTE) The Xpert Xpress SARS-CoV-2/FLU/RSV plus assay is intended as an aid in the diagnosis of influenza from Nasopharyngeal swab specimens and should not be used as a sole basis for treatment. Nasal washings and aspirates are unacceptable for Xpert Xpress SARS-CoV-2/FLU/RSV testing.  Fact Sheet for Patients: EntrepreneurPulse.com.au  Fact Sheet for Healthcare Providers: IncredibleEmployment.be  This test is not yet approved or cleared by the [place] FDA and has been authorized for detection and/or diagnosis of SARS-CoV-2 by FDA under an Emergency Use Authorization (EUA). This EUA will remain in effect (meaning this test can be used) for the duration of the COVID-19 declaration under Section 564(b)(1) of the Act, 21 U.S.C. section 360bbb-3(b)(1), unless the authorization is terminated or revoked.  Performed at [facility], [address] [place], [place] [address]      Time coordinating discharge: Over 30 minutes  SIGNED:   Wyvonnia Dusky, MD  [facility] 08/12/2021, 11:15 AM Pager   If 7PM-7AM, please contact night-coverage

## 2021-08-16 ENCOUNTER — Ambulatory Visit: Payer: Medicare Other | Admitting: Family

## 2021-08-16 ENCOUNTER — Telehealth: Payer: Self-pay | Admitting: Family

## 2021-08-16 NOTE — Telephone Encounter (Signed)
Patient did not show for his [facility] appointment on 08/16/21. Will attempt to reschedule.   ?

## 2022-01-03 ENCOUNTER — Other Ambulatory Visit: Payer: Self-pay

## 2022-01-03 ENCOUNTER — Emergency Department: Payer: Medicare Other

## 2022-01-03 ENCOUNTER — Emergency Department
Admission: EM | Admit: 2022-01-03 | Discharge: 2022-01-03 | Disposition: A | Discharge status: Home / Self Care | Payer: Medicare Other | Attending: Emergency Medicine | Admitting: Emergency Medicine

## 2022-01-03 DIAGNOSIS — I714 Abdominal aortic aneurysm, without rupture, unspecified: Secondary | ICD-10-CM | POA: Insufficient documentation

## 2022-01-03 DIAGNOSIS — I509 Heart failure, unspecified: Secondary | ICD-10-CM | POA: Diagnosis not present

## 2022-01-03 DIAGNOSIS — R112 Nausea with vomiting, unspecified: Secondary | ICD-10-CM | POA: Diagnosis present

## 2022-01-03 DIAGNOSIS — I2510 Atherosclerotic heart disease of native coronary artery without angina pectoris: Secondary | ICD-10-CM | POA: Insufficient documentation

## 2022-01-03 DIAGNOSIS — I130 Hypertensive heart and chronic kidney disease with heart failure and stage 1 through stage 4 chronic kidney disease, or unspecified chronic kidney disease: Secondary | ICD-10-CM | POA: Diagnosis not present

## 2022-01-03 DIAGNOSIS — Z8546 Personal history of malignant neoplasm of prostate: Secondary | ICD-10-CM | POA: Insufficient documentation

## 2022-01-03 DIAGNOSIS — N1832 Chronic kidney disease, stage 3b: Secondary | ICD-10-CM | POA: Insufficient documentation

## 2022-01-03 DIAGNOSIS — N3000 Acute cystitis without hematuria: Secondary | ICD-10-CM | POA: Insufficient documentation

## 2022-01-03 DIAGNOSIS — I700 Atherosclerosis of aorta: Secondary | ICD-10-CM | POA: Diagnosis not present

## 2022-01-03 LAB — BRAIN NATRIURETIC PEPTIDE: B Natriuretic Peptide: 470.2 pg/mL — ABNORMAL HIGH (ref 0.0–100.0)

## 2022-01-03 LAB — COMPREHENSIVE METABOLIC PANEL
ALT: 35 U/L (ref 0–44)
AST: 37 U/L (ref 15–41)
Albumin: 3.5 g/dL (ref 3.5–5.0)
Alkaline Phosphatase: 76 U/L (ref 38–126)
Anion gap: 7 (ref 5–15)
BUN: 14 mg/dL (ref 8–23)
CO2: 24 mmol/L (ref 22–32)
Calcium: 8.9 mg/dL (ref 8.9–10.3)
Chloride: 105 mmol/L (ref 98–111)
Creatinine, Ser: 1.87 mg/dL — ABNORMAL HIGH (ref 0.61–1.24)
GFR, Estimated: 36 mL/min — ABNORMAL LOW (ref >60)
Glucose, Bld: 123 mg/dL — ABNORMAL HIGH (ref 70–99)
Potassium: 3.8 mmol/L (ref 3.5–5.1)
Sodium: 136 mmol/L (ref 135–145)
Total Bilirubin: 0.5 mg/dL (ref 0.3–1.2)
Total Protein: 6 g/dL — ABNORMAL LOW (ref 6.5–8.1)

## 2022-01-03 LAB — URINALYSIS, ROUTINE W REFLEX MICROSCOPIC
Bilirubin Urine: NEGATIVE
Glucose, UA: NEGATIVE mg/dL
Hgb urine dipstick: NEGATIVE
Ketones, ur: 5 mg/dL — AB
Nitrite: NEGATIVE
Protein, ur: NEGATIVE mg/dL
Specific Gravity, Urine: 1.033 — ABNORMAL HIGH (ref 1.005–1.030)
pH: 6 (ref 5.0–8.0)

## 2022-01-03 LAB — CBC WITH DIFFERENTIAL/PLATELET
Abs Immature Granulocytes: 0.03 10*3/uL (ref 0.00–0.07)
Basophils Absolute: 0 10*3/uL (ref 0.0–0.1)
Basophils Relative: 0 %
Eosinophils Absolute: 0.1 10*3/uL (ref 0.0–0.5)
Eosinophils Relative: 1 %
HCT: 31.5 % — ABNORMAL LOW (ref 39.0–52.0)
Hemoglobin: 10 g/dL — ABNORMAL LOW (ref 13.0–17.0)
Immature Granulocytes: 0 %
Lymphocytes Relative: 7 %
Lymphs Abs: 0.5 10*3/uL — ABNORMAL LOW (ref 0.7–4.0)
MCH: 29.9 pg (ref 26.0–34.0)
MCHC: 31.7 g/dL (ref 30.0–36.0)
MCV: 94.3 fL (ref 80.0–100.0)
Monocytes Absolute: 0.5 10*3/uL (ref 0.1–1.0)
Monocytes Relative: 7 %
Neutro Abs: 5.9 10*3/uL (ref 1.7–7.7)
Neutrophils Relative %: 85 %
Platelets: 172 10*3/uL (ref 150–400)
RBC: 3.34 MIL/uL — ABNORMAL LOW (ref 4.22–5.81)
RDW: 13.8 % (ref 11.5–15.5)
WBC: 7.1 10*3/uL (ref 4.0–10.5)
nRBC: 0 % (ref 0.0–0.2)

## 2022-01-03 LAB — TROPONIN I (HIGH SENSITIVITY)
Troponin I (High Sensitivity): 16 ng/L (ref <18)
Troponin I (High Sensitivity): 15 ng/L (ref <18)

## 2022-01-03 LAB — LIPASE, BLOOD: Lipase: 27 U/L (ref 11–51)

## 2022-01-03 MED ORDER — ONDANSETRON HCL 4 MG/2ML IJ SOLN
4.0000 mg | Freq: Once | INTRAMUSCULAR | Status: AC
  Administered 2022-01-03: 4 mg via INTRAVENOUS
  Filled 2022-01-03: qty 2

## 2022-01-03 MED ORDER — CEPHALEXIN 500 MG PO CAPS
500.0000 mg | ORAL_CAPSULE | Freq: Once | ORAL | Status: AC
  Administered 2022-01-03: 500 mg via ORAL
  Filled 2022-01-03: qty 1

## 2022-01-03 MED ORDER — SODIUM CHLORIDE 0.9 % IV BOLUS
1000.0000 mL | Freq: Once | INTRAVENOUS | Status: AC
  Administered 2022-01-03: 1000 mL via INTRAVENOUS

## 2022-01-03 MED ORDER — ONDANSETRON 4 MG PO TBDP: 4.0000 mg | ORAL_TABLET | Freq: Three times a day (TID) | ORAL | 0 refills | Status: AC | PRN

## 2022-01-03 MED ORDER — CEPHALEXIN 500 MG PO CAPS: 500.0000 mg | ORAL_CAPSULE | Freq: Three times a day (TID) | ORAL | 0 refills | Status: AC

## 2022-01-03 MED ORDER — IOHEXOL 300 MG/ML  SOLN
100.0000 mL | Freq: Once | INTRAMUSCULAR | Status: AC | PRN
  Administered 2022-01-03: 100 mL via INTRAVENOUS

## 2022-01-03 NOTE — ED Notes (Signed)
PO fluids provided. 

## 2022-01-03 NOTE — ED Provider Notes (Signed)
[facility] Provider Note    Event Date/Time   First MD Initiated Contact with Patient 01/03/22 1523     (approximate)   History   Emesis   HPI  Matthew Zamora is a 81 y.o. male  with medical history significant of CHF with EF of 40-45%, hypertension, hyperlipidemia, COPD on as needed 2 L oxygen at home, GERD, prostate cancer, PAF on Eliquis, chronic hoarseness, CAD, anemia, AAA, hepatitis, CKD-3B, pacemaker placement, and pancreatic insufficiency who presents for evaluation of 1 day of some nausea and vomiting.  Patient states it has been about 3 days since he had a bowel movement and a family member who is a Marine scientist given the laxative today but he does not think this helped.  He denies any chest pain, cough, shortness of breath, abdominal pain, back pain, urinary symptoms, headache, earache, sore throat or any extremity symptoms.  No recent falls or injuries.  No recent alcohol.  He states he takes all of his medications as directed.  He has no other acute concerns at this time.  Patient does report that his normal weight is around 158 and that he last weighed himself earlier today and was around 140 pounds.      Physical Exam  Triage Vital Signs: ED Triage Vitals  Enc Vitals Group     BP 01/03/22 1351 (!) 86/55     Pulse Rate 01/03/22 1351 60     Resp 01/03/22 1351 18     Temp 01/03/22 1351 97.6 F (36.4 C)     Temp Source 01/03/22 1351 Oral     SpO2 01/03/22 1351 99 %     Weight 01/03/22 1341 145 lb (65.8 kg)     Height 01/03/22 1341 '5\' 9"'$  (1.753 m)     Head Circumference --      Peak Flow --      Pain Score 01/03/22 1341 0     Pain Loc --      Pain Edu? --      Excl. in [place]? --     Most recent vital signs: Vitals:   01/03/22 2000 01/03/22 2030  BP: (!) 137/54 (!) 145/62  Pulse: 60 60  Resp: 13 14  Temp:    SpO2: 98% 99%    General: Awake, no distress.  CV:  Good peripheral perfusion.  2+ radial pulse. Resp:  Normal effort.  Clear  bilaterally. Abd:  No distention.  Soft throughout.  No CVA tenderness Other:  No significant lower extremity edema   ED Results / Procedures / Treatments  Labs (all labs ordered are listed, but only abnormal results are displayed) Labs Reviewed  CBC WITH DIFFERENTIAL/PLATELET - Abnormal; Notable for the following components:      Result Value   RBC 3.34 (*)    Hemoglobin 10.0 (*)    HCT 31.5 (*)    Lymphs Abs 0.5 (*)    All other components within normal limits  COMPREHENSIVE METABOLIC PANEL - Abnormal; Notable for the following components:   Glucose, Bld 123 (*)    Creatinine, Ser 1.87 (*)    Total Protein 6.0 (*)    GFR, Estimated 36 (*)    All other components within normal limits  URINALYSIS, ROUTINE W REFLEX MICROSCOPIC - Abnormal; Notable for the following components:   Color, Urine YELLOW (*)    APPearance HAZY (*)    Specific Gravity, Urine 1.033 (*)    Ketones, ur 5 (*)    Leukocytes,Ua SMALL (*)  Bacteria, UA RARE (*)    All other components within normal limits  BRAIN NATRIURETIC PEPTIDE - Abnormal; Notable for the following components:   B Natriuretic Peptide 470.2 (*)    All other components within normal limits  URINE CULTURE  LIPASE, BLOOD  TROPONIN I (HIGH SENSITIVITY)  TROPONIN I (HIGH SENSITIVITY)     EKG  ECG is remarkable for sinus bradycardia with a ventricular rate of 51, normal axis, nonspecific changes artifact in inferior leads as well as nonspecific change in V5 and V6 without other clear evidence of acute ischemia or significant arrhythmia.    RADIOLOGY Chest reviewed by myself shows no focal consoidation, effusion, edema, pneumothorax or other clear acute thoracic process. I also reviewed radiology interpretation and agree with findings described.  CT abdomen pelvis on my interpretation without evidence of an SBO, ileus, perforation, diverticulitis, kidney stone, perinephric stranding or other clear acute process.  I reviewed radiology  interpretation and agree their findings of stable 3.3 cm infrarenal abdominal aortic aneurysm with stable hepatic cysts and aortic atherosclerosis but no other acute process.  PROCEDURES:  Critical Care performed: No  .1-3 Lead EKG Interpretation  Performed by: Lucrezia Starch, MD Authorized by: Lucrezia Starch, MD     Interpretation: non-specific     ECG rate assessment: bradycardic     Rhythm: sinus rhythm     Ectopy: none     Conduction: normal     The patient is on the cardiac monitor to evaluate for evidence of arrhythmia and/or significant heart rate changes.   MEDICATIONS ORDERED IN ED: Medications  cephALEXin (KEFLEX) capsule 500 mg (has no administration in time range)  sodium chloride 0.9 % bolus 1,000 mL (0 mLs Intravenous Stopped 01/03/22 1539)  ondansetron (ZOFRAN) injection 4 mg (4 mg Intravenous Given 01/03/22 1937)  iohexol (OMNIPAQUE) 300 MG/ML solution 100 mL (100 mLs Intravenous Contrast Given 01/03/22 1546)     IMPRESSION / MDM / ASSESSMENT AND PLAN / ED COURSE  I reviewed the triage vital signs and the nursing notes. Patient's presentation is most consistent with acute presentation with potential threat to life or bodily function.                               Differential diagnosis includes, but is not limited to infectious gastritis, cholecystitis, pancreatitis, ileus, SBO, symptomatic constipation, kidney stone, AAA and typical ACS presentation.  He was noted to be hypotensive in triage and was given a liter of saline and on recheck of his blood pressure once roomed he is now normotensive with a systolic of 539.  ECG is remarkable for sinus bradycardia with a ventricular rate of 51, normal axis, nonspecific changes artifact in inferior leads as well as nonspecific change in V5 and V6 without other clear evidence of acute ischemia or significant arrhythmia.  Nonelevated troponin x2 is not suggestive of ACS.  Chest reviewed by myself shows no focal  consoidation, effusion, edema, pneumothorax or other clear acute thoracic process. I also reviewed radiology interpretation and agree with findings described.  CBC without leukocytosis and stable hemoglobin at 10 compared to 10 4 months ago with normal platelets.  CMP shows creatinine of 1.87 compared to 2.544 months ago without any other significant lecture light or metabolic derangements.  There is no evidence of acute cholestatic process or hepatitis.  BNP is 470 compared to baseline around 400-500 but overall patient does not appear significantly volume overloaded at  this time.  Lipase WNL not suggestive of pancreatitis.  UA does have small leuks esterase and 21-50 WBCs concerning for possible cystitis.  We will send urine culture and treat with a course of Keflex.  CT abdomen pelvis on my interpretation without evidence of an SBO, ileus, perforation, diverticulitis, kidney stone, perinephric stranding or other clear acute process.  I reviewed radiology interpretation and agree their findings of stable 3.3 cm infrarenal abdominal aortic aneurysm with stable hepatic cysts and aortic atherosclerosis but no other acute process.  Patient is feeling much better and able tolerate p.o. after some Zofran.  Given stable vitals with patient tolerate p.o. and was patient for immediate life in process I think is appropriate for discharge with outpatient follow-up.  Rx written for Zofran and Keflex.  Discharged in stable condition.  Strict return precautions advised and discussed.  Advised of AAA and recommendation was to undergo surveillance imaging as indicated by his PCP.     FINAL CLINICAL IMPRESSION(S) / ED DIAGNOSES   Final diagnoses:  Nausea and vomiting, unspecified vomiting type  Acute cystitis without hematuria  Abdominal aortic aneurysm (AAA) without rupture, unspecified part ([place])  Aortic atherosclerosis ([place])     Rx / DC Orders   ED Discharge Orders          Ordered    ondansetron  (ZOFRAN-ODT) 4 MG disintegrating tablet  Every 8 hours PRN        01/03/22 2045    cephALEXin (KEFLEX) 500 MG capsule  3 times daily        01/03/22 2045             Note:  This document was prepared using Dragon voice recognition software and may include unintentional dictation errors.   Lucrezia Starch, MD 01/03/22 2046

## 2022-01-03 NOTE — ED Provider Triage Note (Signed)
Emergency Medicine Provider Triage Evaluation Note  ROLAN WRIGHTSMAN , a 81 y.o. male  was evaluated in triage.  Patient has a history of hypertension, coronary artery disease, CHF and pancreatic insufficiency.  Pt complains of nausea and vomiting that started several hours ago.  Patient is on 2 L of supplemental oxygen and has had no new oxygen demands.  Patient states that he feels fluid overloaded.  Review of Systems  Positive: Patient has nausea and vomiting.  Negative: No chest pain or abdominal pain.   Physical Exam  Ht '5\' 9"'$  (1.753 m)   Wt 65.8 kg   BMI 21.41 kg/m  Gen:   Awake, no distress   Resp:  Normal effort  MSK:   Moves extremities without difficulty  Other:    Medical Decision Making  Medically screening exam initiated at 1:43 PM.  Appropriate orders placed.  KYUSS HALE was informed that the remainder of the evaluation will be completed by another provider, this initial triage assessment does not replace that evaluation, and the importance of remaining in the ED until their evaluation is complete.     Vallarie Mare [place], [place] 01/03/22 1346

## 2022-01-03 NOTE — ED Triage Notes (Signed)
Pt arrives via EMS for vomiting that started last night- per EMS it was bile only- pt is on 2L  chronic and has a pacemaker- pt states he thinks he has fluid build up around his lungs which has happened before and this is similar

## 2022-01-03 NOTE — Discharge Instructions (Addendum)
CT of your abdomen and pelvis today showed: IMPRESSION: 3.3 cm infrarenal abdominal aortic aneurysm. Recommend follow-up ultrasound every 3 years. This recommendation follows ACR consensus guidelines: White Paper of the ACR Incidental Findings Committee II on Vascular Findings. J Am Coll Radiol 2013; 10:789-794.   Stable hepatic cysts.   No acute abnormality seen in the abdomen or pelvis.   Aortic Atherosclerosis (ICD10-I70.0).

## 2022-01-03 NOTE — ED Notes (Signed)
Patient transported to CT 

## 2022-01-03 NOTE — Progress Notes (Signed)
   01/03/22 1430  Clinical Encounter Type  Visited With Patient  Visit Type Initial;Spiritual support;Social support  Spiritual Encounters  Spiritual Needs Emotional   Chaplain Sirr Kabel engaged pt in triage and pt was cold, stressed. Offered compassionate presence and hospitality, warm blankets. Visited with pt in triage and he perked up after some fluyids. 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  Seemed to really appreciate the supportive presence. Will f/u as able.

## 2022-01-03 NOTE — ED Notes (Signed)
Pt presents to ED with c/o Emesis. Pt states he started vomiting last night and got worse today. Pt denies abdominal pain at this time. Pt states he wear 2L [place] as needed, denies any hx of COPD.

## 2022-01-03 NOTE — ED Notes (Signed)
Patient verbalizes understanding of discharge instructions. Opportunity for questioning and answers were provided. Armband removed by staff, pt discharged from ED to home via POV  

## 2022-01-06 LAB — URINE CULTURE: Culture: >=100000 — AB

## 2022-01-07 ENCOUNTER — Telehealth (HOSPITAL_COMMUNITY): Payer: Self-pay | Admitting: Pharmacy Technician

## 2022-01-07 ENCOUNTER — Other Ambulatory Visit (HOSPITAL_COMMUNITY): Payer: Self-pay

## 2022-01-07 NOTE — Progress Notes (Addendum)
ED Antimicrobial Stewardship Positive Culture Follow Up   Matthew Zamora is an 81 y.o. male who presented to [facility] on 01/03/2022 with a chief complaint of nausea and vomiting. PMH includes CKD (last CrCl 29), and prostate cancer. UA with pyuria. Urine culture growing ESBL.  Chief Complaint  Patient presents with   Emesis    Recent Results (from the past 720 hour(s))  Urine Culture     Status: Abnormal   Collection Time: 01/03/22  8:43 PM   Specimen: Urine, Random  Result Value Ref Range Status   Specimen Description   Final    URINE, RANDOM Performed at [facility], [address] [place], [place] [address]    Special Requests   Final    NONE Performed at [facility], [address] [place], [place] [address]    Culture (A)  Final    >=100,000 COLONIES/mL ESCHERICHIA COLI Confirmed Extended Spectrum Beta-Lactamase Producer (ESBL).  In bloodstream infections from ESBL organisms, carbapenems are preferred over piperacillin/tazobactam. They are shown to have a lower risk of mortality.    Report Status 01/06/2022 FINAL  Final   Organism ID, Bacteria ESCHERICHIA COLI (A)  Final      Susceptibility   Escherichia coli - MIC*    AMPICILLIN >=32 RESISTANT Resistant     CEFAZOLIN >=64 RESISTANT Resistant     CEFEPIME 2 SENSITIVE Sensitive     CEFTRIAXONE >=64 RESISTANT Resistant     CIPROFLOXACIN >=4 RESISTANT Resistant     GENTAMICIN <=1 SENSITIVE Sensitive     IMIPENEM <=0.25 SENSITIVE Sensitive     NITROFURANTOIN <=16 SENSITIVE Sensitive     TRIMETH/SULFA <=20 SENSITIVE Sensitive     AMPICILLIN/SULBACTAM 4 SENSITIVE Sensitive     PIP/TAZO <=4 SENSITIVE Sensitive     * >=100,000 COLONIES/mL ESCHERICHIA COLI    '[x]'$  Treated with cephalexin, organism resistant to prescribed antimicrobial (Avoiding Bactrim given age and advanced CKD)  New antibiotic prescription: fosfomycin 3 grams x 1  Called patient, who reported ongoing nausea. Called son, who  will be picking up prescription. Called prescription to [facility] on [address]. Advised patient and patient's son to return to ED if symptoms worsen or do not improve.   ED Provider: Dr. Noralee Space 01/07/2022, 2:12 PM Clinical Pharmacist Monday - Friday phone -  [phone] Saturday - Sunday phone - [phone]

## 2022-01-07 NOTE — TOC Benefit Eligibility Note (Signed)
Patient Teacher, English as a foreign language completed.    The patient is currently admitted and upon discharge could be taking fosfomycin (Monurol) 3 g Pack.  The current 30 day co-pay is $15.00.   The patient is insured through [place], [place] Patient Advocate Specialist [place] Patient Advocate Team Direct Number: [phone]  Fax: [phone]

## 2022-01-07 NOTE — Telephone Encounter (Signed)
Pharmacy Patient Advocate Encounter  Insurance verification completed.    The patient is insured through [employer] Part D   The patient is currently admitted and ran test claims for the following: fosfomycin (Monurol) 3 g Pack..  Copays and coinsurance results were relayed to Inpatient clinical team.

## 2022-01-29 ENCOUNTER — Emergency Department: Payer: Medicare Other

## 2022-01-29 ENCOUNTER — Emergency Department
Admission: EM | Admit: 2022-01-29 | Discharge: 2022-01-30 | Disposition: A | Discharge status: Home / Self Care | Payer: Medicare Other | Attending: Emergency Medicine | Admitting: Emergency Medicine

## 2022-01-29 ENCOUNTER — Other Ambulatory Visit: Payer: Self-pay

## 2022-01-29 ENCOUNTER — Encounter: Payer: Self-pay | Admitting: Emergency Medicine

## 2022-01-29 DIAGNOSIS — Z8546 Personal history of malignant neoplasm of prostate: Secondary | ICD-10-CM | POA: Insufficient documentation

## 2022-01-29 DIAGNOSIS — I130 Hypertensive heart and chronic kidney disease with heart failure and stage 1 through stage 4 chronic kidney disease, or unspecified chronic kidney disease: Secondary | ICD-10-CM | POA: Diagnosis not present

## 2022-01-29 DIAGNOSIS — Z950 Presence of cardiac pacemaker: Secondary | ICD-10-CM | POA: Diagnosis not present

## 2022-01-29 DIAGNOSIS — J449 Chronic obstructive pulmonary disease, unspecified: Secondary | ICD-10-CM | POA: Diagnosis not present

## 2022-01-29 DIAGNOSIS — Z7901 Long term (current) use of anticoagulants: Secondary | ICD-10-CM | POA: Diagnosis not present

## 2022-01-29 DIAGNOSIS — R0781 Pleurodynia: Secondary | ICD-10-CM | POA: Insufficient documentation

## 2022-01-29 DIAGNOSIS — S51012A Laceration without foreign body of left elbow, initial encounter: Secondary | ICD-10-CM | POA: Insufficient documentation

## 2022-01-29 DIAGNOSIS — I951 Orthostatic hypotension: Secondary | ICD-10-CM

## 2022-01-29 DIAGNOSIS — W19XXXA Unspecified fall, initial encounter: Secondary | ICD-10-CM | POA: Diagnosis not present

## 2022-01-29 DIAGNOSIS — S51812A Laceration without foreign body of left forearm, initial encounter: Secondary | ICD-10-CM | POA: Diagnosis not present

## 2022-01-29 DIAGNOSIS — S80212A Abrasion, left knee, initial encounter: Secondary | ICD-10-CM | POA: Insufficient documentation

## 2022-01-29 DIAGNOSIS — I2510 Atherosclerotic heart disease of native coronary artery without angina pectoris: Secondary | ICD-10-CM | POA: Insufficient documentation

## 2022-01-29 DIAGNOSIS — R42 Dizziness and giddiness: Secondary | ICD-10-CM

## 2022-01-29 DIAGNOSIS — S59902A Unspecified injury of left elbow, initial encounter: Secondary | ICD-10-CM | POA: Diagnosis present

## 2022-01-29 DIAGNOSIS — R55 Syncope and collapse: Secondary | ICD-10-CM | POA: Diagnosis not present

## 2022-01-29 DIAGNOSIS — I5043 Acute on chronic combined systolic (congestive) and diastolic (congestive) heart failure: Secondary | ICD-10-CM | POA: Insufficient documentation

## 2022-01-29 DIAGNOSIS — N1832 Chronic kidney disease, stage 3b: Secondary | ICD-10-CM | POA: Diagnosis not present

## 2022-01-29 LAB — COMPREHENSIVE METABOLIC PANEL
ALT: 43 U/L (ref 0–44)
AST: 31 U/L (ref 15–41)
Albumin: 3.5 g/dL (ref 3.5–5.0)
Alkaline Phosphatase: 60 U/L (ref 38–126)
Anion gap: 3 — ABNORMAL LOW (ref 5–15)
BUN: 20 mg/dL (ref 8–23)
CO2: 26 mmol/L (ref 22–32)
Calcium: 8.6 mg/dL — ABNORMAL LOW (ref 8.9–10.3)
Chloride: 109 mmol/L (ref 98–111)
Creatinine, Ser: 2.15 mg/dL — ABNORMAL HIGH (ref 0.61–1.24)
GFR, Estimated: 30 mL/min — ABNORMAL LOW (ref >60)
Glucose, Bld: 96 mg/dL (ref 70–99)
Potassium: 4.1 mmol/L (ref 3.5–5.1)
Sodium: 138 mmol/L (ref 135–145)
Total Bilirubin: 0.6 mg/dL (ref 0.3–1.2)
Total Protein: 5.6 g/dL — ABNORMAL LOW (ref 6.5–8.1)

## 2022-01-29 LAB — CBC WITH DIFFERENTIAL/PLATELET
Abs Immature Granulocytes: 0.03 10*3/uL (ref 0.00–0.07)
Basophils Absolute: 0 10*3/uL (ref 0.0–0.1)
Basophils Relative: 1 %
Eosinophils Absolute: 0.1 10*3/uL (ref 0.0–0.5)
Eosinophils Relative: 2 %
HCT: 31.6 % — ABNORMAL LOW (ref 39.0–52.0)
Hemoglobin: 9.8 g/dL — ABNORMAL LOW (ref 13.0–17.0)
Immature Granulocytes: 1 %
Lymphocytes Relative: 11 %
Lymphs Abs: 0.5 10*3/uL — ABNORMAL LOW (ref 0.7–4.0)
MCH: 29.6 pg (ref 26.0–34.0)
MCHC: 31 g/dL (ref 30.0–36.0)
MCV: 95.5 fL (ref 80.0–100.0)
Monocytes Absolute: 0.4 10*3/uL (ref 0.1–1.0)
Monocytes Relative: 10 %
Neutro Abs: 3.5 10*3/uL (ref 1.7–7.7)
Neutrophils Relative %: 75 %
Platelets: 138 10*3/uL — ABNORMAL LOW (ref 150–400)
RBC: 3.31 MIL/uL — ABNORMAL LOW (ref 4.22–5.81)
RDW: 14.2 % (ref 11.5–15.5)
WBC: 4.6 10*3/uL (ref 4.0–10.5)
nRBC: 0 % (ref 0.0–0.2)

## 2022-01-29 LAB — TROPONIN I (HIGH SENSITIVITY)
Troponin I (High Sensitivity): 14 ng/L (ref <18)
Troponin I (High Sensitivity): 16 ng/L (ref <18)

## 2022-01-29 MED ORDER — SODIUM CHLORIDE 0.9 % IV BOLUS
500.0000 mL | Freq: Once | INTRAVENOUS | Status: AC
  Administered 2022-01-30: 500 mL via INTRAVENOUS

## 2022-01-29 NOTE — ED Triage Notes (Signed)
First nurse note: Pt arrived by EMS from home for fall. Reports he felt dizziness and fell on his left side. Left arm skin tears present. C/o left flank pain. Reports he hasn't felt well for days. Reports having stimulator/pacemaker. Unsteady with gait and patient reports that is not normal for him.  EMS reports positive orthostatics Initial b/p 88/43 EMS administered 1L normal saline bolus 120/68 b/p

## 2022-01-30 ENCOUNTER — Other Ambulatory Visit: Payer: Self-pay

## 2022-01-30 LAB — URINALYSIS, ROUTINE W REFLEX MICROSCOPIC
Bilirubin Urine: NEGATIVE
Glucose, UA: NEGATIVE mg/dL
Hgb urine dipstick: NEGATIVE
Ketones, ur: NEGATIVE mg/dL
Leukocytes,Ua: NEGATIVE
Nitrite: NEGATIVE
Protein, ur: NEGATIVE mg/dL
Specific Gravity, Urine: 1.013 (ref 1.005–1.030)
pH: 7 (ref 5.0–8.0)

## 2022-01-30 MED ORDER — OXYMETAZOLINE HCL 0.05 % NA SOLN
1.0000 | Freq: Once | NASAL | Status: AC
  Administered 2022-01-30: 1 via NASAL
  Filled 2022-01-30: qty 30

## 2022-01-30 NOTE — ED Notes (Signed)
Pt fell today and landed on left side. Pt has skin tears to left arm. Pt A&Ox4. Pt says he has been falling more frequently and is weak.

## 2022-01-30 NOTE — ED Notes (Signed)
Called ms Staiger [phone] (number night rn left) no answer left message

## 2022-01-30 NOTE — ED Notes (Signed)
Pt's contact was called for a ride for discharge. Pt's son is in [place] and unable to get son. Pt's daughter in law should be able to be contacted in a few hours person her husband. Pt resting at this time.

## 2022-01-30 NOTE — ED Provider Notes (Signed)
Patient received in signout from Dr. Starleen Blue pending reassessment and ambulation trial after a 500 cc fluid bolus in the setting of positional presyncope.  After 500 of normal saline, patient reports feeling well.  He ambulates with his typical assistance device without dizziness, falls or symptoms.  He reports that he is ready to go home and requesting socks to go home with.  We did interrogate his [employer] device and he has had no ventricular dysrhythmias in the past 1 month since it was interrogated last.  Per original plan of care, we will discharge with close return precautions.   Vladimir Crofts, MD 01/30/22 [id number]

## 2022-01-30 NOTE — ED Notes (Signed)
Pt ambulated around the room with the assistance of a walker - tolerated well.

## 2022-01-30 NOTE — ED Provider Notes (Signed)
[facility] Provider Note    Event Date/Time   First MD Initiated Contact with Patient 01/29/22 2136     (approximate)   History   Dizziness   HPI  Matthew Zamora is a 81 y.o. male past medical history of ischemic cardiomyopathy EF 35 to 40%, hypertension hyperlipidemia paroxysmal A-fib on anticoagulation status post permanent pacemaker presents today with syncope.  Patient stood up yesterday and felt lightheaded and passed out.  He fell onto his left arm and the left side of his chest is having some left lower rib pain denies abdominal pain.  He today has felt lightheaded upon standing but has not not had recurrent syncope he denies any chest pain, had mild dyspnea over the last several weeks.  Denies shortness of breath currently.  No fevers chills or recent illnesses.  No defibrillator shocks.     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 pylori infection    History of hepatitis    Hyperlipidemia    Hypertension    Liver cyst 05/16/15   PAF (paroxysmal atrial fibrillation) ([place])    Prostate CA [facility])     Patient Active Problem List   Diagnosis Date Noted   Acute on chronic combined systolic (congestive) and diastolic (congestive) heart failure ([place]) 08/10/2021   Acute respiratory failure with hypoxia ([place])    HCAP (healthcare-associated pneumonia) 06/13/2021   Acute on chronic systolic CHF (congestive heart failure) ([place]) 06/13/2021   Elevated troponin 06/13/2021   CKD (chronic kidney disease), 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   Lung nodule 06/13/2021   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   COPD exacerbation ([place]) 06/13/2021    Volume depletion 12/15/2019   Hypotension 12/15/2019   Cardiorenal syndrome    Atrial fibrillation with RVR ([place])    Defibrillator discharge    Tachycardia 10/25/2019   CHF (congestive heart failure) ([place]) 09/18/2019   Acute on chronic combined systolic and diastolic CHF (congestive heart failure) ([place]) 09/17/2019   CAD (coronary artery disease) 09/17/2019   Ischemic cardiomyopathy 09/17/2019   CKD (chronic kidney disease) stage 3, GFR 30-59 ml/min ([place]) 09/17/2019   Acute hypoxemic respiratory failure ([place]) 09/17/2018   AAA (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 ([place]) 07/06/2018   Essential hypertension 07/06/2018   Hyperlipidemia 07/06/2018   Paroxysmal atrial fibrillation ([name])    Acute on chronic respiratory failure with hypoxia ([place]) 04/26/2018     Physical Exam  Triage Vital Signs: ED Triage Vitals  Enc Vitals Group     BP 01/29/22 1511 (!) 89/76     Pulse Rate 01/29/22 1511 73     Resp 01/29/22 1511 15     Temp 01/29/22 1511 (!) 97.5 F (36.4 C)     Temp Source 01/29/22 1511 Oral     SpO2 01/29/22 1511 100 %     Weight --      Height --      Head Circumference --      Peak Flow --      Pain Score 01/29/22 1512 0     Pain Loc --      Pain Edu? --  Excl. in [place]? --     Most recent vital signs: Vitals:   01/29/22 2300 01/29/22 2330  BP: 123/84 135/80  Pulse: 76 83  Resp: 20 15  Temp:    SpO2: 95% 100%     General: Awake, no distress.  Patient is chronically ill-appearing CV:  Good peripheral perfusion.  No lower extremity edema Resp:  Normal effort.  No increased work of breathing Abd:  No distention.  Abdomen is soft and nontender Neuro:             Awake, Alert, Oriented x 3 patient is mildly tremulous which she says is his baseline Other:  No signs of trauma to the head or neck, no C-spine tenderness Mild tenderness to palpation over the left anterior lower ribs no crepitus Skin tear over the left elbow and left forearm no focal bony  tenderness or deformity patient able to range the wrist and elbow Abrasion over the left knee which is nontender no deformity Patient able to ambulate but is somewhat unsteady    ED Results / Procedures / Treatments  Labs (all labs ordered are listed, but only abnormal results are displayed) Labs Reviewed  CBC WITH DIFFERENTIAL/PLATELET - Abnormal; Notable for the following components:      Result Value   RBC 3.31 (*)    Hemoglobin 9.8 (*)    HCT 31.6 (*)    Platelets 138 (*)    Lymphs Abs 0.5 (*)    All other components within normal limits  COMPREHENSIVE METABOLIC PANEL - Abnormal; Notable for the following components:   Creatinine, Ser 2.15 (*)    Calcium 8.6 (*)    Total Protein 5.6 (*)    GFR, Estimated 30 (*)    Anion gap 3 (*)    All other components within normal limits  URINALYSIS, ROUTINE W REFLEX MICROSCOPIC  TROPONIN I (HIGH SENSITIVITY)  TROPONIN I (HIGH SENSITIVITY)     EKG  EKG interpreted by myself is a paced rhythm with normal axis, question underlying flutter versus fib no acute ischemic changes   RADIOLOGY I reviewed and interpreted the CT scan of the brain which does not show any acute intracranial process    PROCEDURES:  Critical Care performed: No  .1-3 Lead EKG Interpretation  Performed by: Rada Hay, MD Authorized by: Rada Hay, MD     Interpretation: abnormal     ECG rate assessment: normal     Rhythm: paced     Ectopy: none     Conduction: normal     The patient is on the cardiac monitor to evaluate for evidence of arrhythmia and/or significant heart rate changes.   MEDICATIONS ORDERED IN ED: Medications  sodium chloride 0.9 % bolus 500 mL (500 mLs Intravenous New Bag/Given 01/30/22 0008)     IMPRESSION / MDM / ASSESSMENT AND PLAN / ED COURSE  I reviewed the triage vital signs and the nursing notes.                              Patient's presentation is most consistent with acute presentation with  potential threat to life or bodily function.  Differential diagnosis includes, but is not limited to, orthostatic hypotension due to dehydration/volume depletion, anemia, infectious process such as UTI, pneumonia, arrhythmia  Patient is an 81 year old male who presents after an episode of syncope and ongoing presyncope.  He lost consciousness after standing yesterday fell onto the left side  of his body did not hit his head and is having some left rib pain.  He has a skin tear on his left elbow and left forearm but no focal bony tenderness mild tenderness over the left anterior lower ribs at the costophrenic angle without crepitus and abdomen is nontender.  ED head and C-spine are negative for intracranial injury and chest x-ray does not show any pneumothorax or obvious displaced rib fracture.  Patient's labs overall reassuring renal function is not far off his baseline he has baseline anemia.  While in the ED on telemetry patient did have several episodes of a wide-complex rhythm with rates in the 80s patient completely asymptomatic during these episodes as I was in the room with him.  Is not V. tach because it is too slow suspect either a ventricular escape rhythm or a ventricular paced rhythm. Symptoms certainly sound most consistent with orthostatic hypotension.  He does not appear volume overloaded to me today and so we will give 500 cc of fluid given low EF.  We did interrogate the patient's pacemaker and there were no episodes of VT VF.   Patient typically ambulates with a walker.  When I stood him up in the ER he was slightly unsteady but also did not have a walker.  Plan to reassess after liter of fluid.  Of note patient did       FINAL CLINICAL IMPRESSION(S) / ED DIAGNOSES   Final diagnoses:  Syncope, unspecified syncope type     Rx / DC Orders   ED Discharge Orders     None        Note:  This document was prepared using Dragon voice recognition software and may include  unintentional dictation errors.   Rada Hay, MD 01/30/22 [phone]

## 2022-02-11 ENCOUNTER — Other Ambulatory Visit: Payer: Self-pay

## 2022-02-11 ENCOUNTER — Encounter: Payer: Self-pay | Admitting: Emergency Medicine

## 2022-02-11 ENCOUNTER — Emergency Department: Payer: Medicare Other

## 2022-02-11 ENCOUNTER — Observation Stay
Admission: EM | Admit: 2022-02-11 | Discharge: 2022-02-13 | Disposition: A | Discharge status: Home Health | Payer: Medicare Other | Attending: Family Medicine | Admitting: Family Medicine

## 2022-02-11 DIAGNOSIS — W1839XA Other fall on same level, initial encounter: Secondary | ICD-10-CM | POA: Diagnosis not present

## 2022-02-11 DIAGNOSIS — Z79899 Other long term (current) drug therapy: Secondary | ICD-10-CM | POA: Diagnosis not present

## 2022-02-11 DIAGNOSIS — I2510 Atherosclerotic heart disease of native coronary artery without angina pectoris: Secondary | ICD-10-CM | POA: Insufficient documentation

## 2022-02-11 DIAGNOSIS — K219 Gastro-esophageal reflux disease without esophagitis: Secondary | ICD-10-CM | POA: Diagnosis not present

## 2022-02-11 DIAGNOSIS — Z8546 Personal history of malignant neoplasm of prostate: Secondary | ICD-10-CM | POA: Insufficient documentation

## 2022-02-11 DIAGNOSIS — R296 Repeated falls: Secondary | ICD-10-CM | POA: Insufficient documentation

## 2022-02-11 DIAGNOSIS — I509 Heart failure, unspecified: Secondary | ICD-10-CM | POA: Diagnosis not present

## 2022-02-11 DIAGNOSIS — R531 Weakness: Secondary | ICD-10-CM

## 2022-02-11 DIAGNOSIS — I130 Hypertensive heart and chronic kidney disease with heart failure and stage 1 through stage 4 chronic kidney disease, or unspecified chronic kidney disease: Secondary | ICD-10-CM | POA: Diagnosis not present

## 2022-02-11 DIAGNOSIS — I472 Ventricular tachycardia, unspecified: Secondary | ICD-10-CM | POA: Diagnosis not present

## 2022-02-11 DIAGNOSIS — I480 Paroxysmal atrial fibrillation: Secondary | ICD-10-CM | POA: Diagnosis not present

## 2022-02-11 DIAGNOSIS — Z7901 Long term (current) use of anticoagulants: Secondary | ICD-10-CM | POA: Diagnosis not present

## 2022-02-11 DIAGNOSIS — Z87891 Personal history of nicotine dependence: Secondary | ICD-10-CM | POA: Insufficient documentation

## 2022-02-11 DIAGNOSIS — I4729 Other ventricular tachycardia: Secondary | ICD-10-CM | POA: Diagnosis not present

## 2022-02-11 DIAGNOSIS — Z7982 Long term (current) use of aspirin: Secondary | ICD-10-CM | POA: Diagnosis not present

## 2022-02-11 DIAGNOSIS — N183 Chronic kidney disease, stage 3 unspecified: Secondary | ICD-10-CM | POA: Diagnosis not present

## 2022-02-11 DIAGNOSIS — Z95810 Presence of automatic (implantable) cardiac defibrillator: Secondary | ICD-10-CM | POA: Diagnosis not present

## 2022-02-11 DIAGNOSIS — E785 Hyperlipidemia, unspecified: Secondary | ICD-10-CM | POA: Diagnosis present

## 2022-02-11 LAB — MAGNESIUM: Magnesium: 2.4 mg/dL (ref 1.7–2.4)

## 2022-02-11 LAB — COMPREHENSIVE METABOLIC PANEL
ALT: 27 U/L (ref 0–44)
AST: 29 U/L (ref 15–41)
Albumin: 3.7 g/dL (ref 3.5–5.0)
Alkaline Phosphatase: 65 U/L (ref 38–126)
Anion gap: 5 (ref 5–15)
BUN: 17 mg/dL (ref 8–23)
CO2: 26 mmol/L (ref 22–32)
Calcium: 9.2 mg/dL (ref 8.9–10.3)
Chloride: 103 mmol/L (ref 98–111)
Creatinine, Ser: 1.83 mg/dL — ABNORMAL HIGH (ref 0.61–1.24)
GFR, Estimated: 37 mL/min — ABNORMAL LOW (ref >60)
Glucose, Bld: 100 mg/dL — ABNORMAL HIGH (ref 70–99)
Potassium: 4.4 mmol/L (ref 3.5–5.1)
Sodium: 134 mmol/L — ABNORMAL LOW (ref 135–145)
Total Bilirubin: 0.8 mg/dL (ref 0.3–1.2)
Total Protein: 6.3 g/dL — ABNORMAL LOW (ref 6.5–8.1)

## 2022-02-11 LAB — CBC WITH DIFFERENTIAL/PLATELET
Abs Immature Granulocytes: 0.03 10*3/uL (ref 0.00–0.07)
Basophils Absolute: 0 10*3/uL (ref 0.0–0.1)
Basophils Relative: 1 %
Eosinophils Absolute: 0.1 10*3/uL (ref 0.0–0.5)
Eosinophils Relative: 2 %
HCT: 32.7 % — ABNORMAL LOW (ref 39.0–52.0)
Hemoglobin: 10.3 g/dL — ABNORMAL LOW (ref 13.0–17.0)
Immature Granulocytes: 1 %
Lymphocytes Relative: 12 %
Lymphs Abs: 0.7 10*3/uL (ref 0.7–4.0)
MCH: 29.6 pg (ref 26.0–34.0)
MCHC: 31.5 g/dL (ref 30.0–36.0)
MCV: 94 fL (ref 80.0–100.0)
Monocytes Absolute: 0.5 10*3/uL (ref 0.1–1.0)
Monocytes Relative: 9 %
Neutro Abs: 4.2 10*3/uL (ref 1.7–7.7)
Neutrophils Relative %: 75 %
Platelets: 178 10*3/uL (ref 150–400)
RBC: 3.48 MIL/uL — ABNORMAL LOW (ref 4.22–5.81)
RDW: 14.7 % (ref 11.5–15.5)
WBC: 5.5 10*3/uL (ref 4.0–10.5)
nRBC: 0 % (ref 0.0–0.2)

## 2022-02-11 LAB — URINALYSIS, ROUTINE W REFLEX MICROSCOPIC
Bilirubin Urine: NEGATIVE
Glucose, UA: NEGATIVE mg/dL
Hgb urine dipstick: NEGATIVE
Ketones, ur: NEGATIVE mg/dL
Nitrite: NEGATIVE
Protein, ur: NEGATIVE mg/dL
Specific Gravity, Urine: 1.013 (ref 1.005–1.030)
pH: 7 (ref 5.0–8.0)

## 2022-02-11 LAB — TROPONIN I (HIGH SENSITIVITY)
Troponin I (High Sensitivity): 10 ng/L (ref <18)
Troponin I (High Sensitivity): 12 ng/L (ref <18)

## 2022-02-11 MED ORDER — SODIUM CHLORIDE 0.9 % IV BOLUS
500.0000 mL | Freq: Once | INTRAVENOUS | Status: AC
Start: 2022-02-12 — End: 2022-02-12
  Administered 2022-02-12: 500 mL via INTRAVENOUS

## 2022-02-11 MED ORDER — SODIUM CHLORIDE 0.9 % IV BOLUS
1000.0000 mL | Freq: Once | INTRAVENOUS | Status: AC
  Administered 2022-02-12: 1000 mL via INTRAVENOUS

## 2022-02-11 NOTE — ED Notes (Signed)
Interrogated pacemaker at bedside. Pending results

## 2022-02-11 NOTE — ED Provider Triage Note (Signed)
  Emergency Medicine Provider Triage Evaluation Note  Matthew Zamora , a 81 y.o.male,  was evaluated in triage.  Pt complains of fall. Patient states  that his legs have been feeling weak x 3 weeks. Experience a fall onto his right side today, reports hitting his shoulder. Denies head injury or LOC. He states that feels dehydrated.    Review of Systems  Positive: Right shoulder pain, weakness Negative: Denies fever, chest pain, vomiting  Physical Exam  There were no vitals filed for this visit. Gen:   Awake, no distress   Resp:  Normal effort  MSK:   Moves extremities without difficulty  Other:  Endorsed tenderness diffusely in right shoulder joint  Medical Decision Making  Given the patient's initial medical screening exam, the following diagnostic evaluation has been ordered. The patient will be placed in the appropriate treatment space, once one is available, to complete the evaluation and treatment. I have discussed the plan of care with the patient and I have advised the patient that an ED physician or mid-level practitioner will reevaluate their condition after the test results have been received, as the results may give them additional insight into the type of treatment they may need.    Diagnostics: Labs, CXR, Right shoulder X ray  Treatments: none immediately   Teodoro Spray, [place] 02/11/22 1534

## 2022-02-11 NOTE — ED Provider Notes (Signed)
[facility] Provider Note    Event Date/Time   First MD Initiated Contact with Patient 02/11/22 2044     (approximate)   History   Fall   HPI  Matthew Zamora is a 81 y.o. male   Past medical history of CAD, CHF, pacemaker, A-fib on Eliquis who presents with progressive generalized weakness over the last several weeks and some frequent falls.  He has presyncope symptoms upon standing, lightheadedness, needs to lower himself down to the ground and rest for resolution of symptoms.  He thinks he may be dehydrated because he is not eating and drinking as much.     Denies exertional chest pain or shortness of breath.   Denies respiratory infectious symptoms, abdominal pain, nausea, vomiting, diarrhea. He had a fall about 1 week ago where he injured his right arm.  Some bruising and skin tears there. Another near-fall today and was able to brace himself and not sustain injury.   He denies head strike or loss of consciousness. He lives alone but his son lives next door and checks on him frequently.  History was obtained via patient and review of external medical notes.      Physical Exam   Triage Vital Signs: ED Triage Vitals  Enc Vitals Group     BP 02/11/22 1538 93/65     Pulse Rate 02/11/22 1538 64     Resp 02/11/22 1538 18     Temp 02/11/22 1538 97.9 F (36.6 C)     Temp Source 02/11/22 1538 Oral     SpO2 02/11/22 1538 95 %     Weight 02/11/22 1534 145 lb 1 oz (65.8 kg)     Height 02/11/22 1534 '5\' 9"'$  (1.753 m)     Head Circumference --      Peak Flow --      Pain Score 02/11/22 1534 0     Pain Loc --      Pain Edu? --      Excl. in [place]? --     Most recent vital signs: Vitals:   02/11/22 2300 02/11/22 2315  BP: (!) 119/57   Pulse: 80   Resp: (!) 21 15  Temp:    SpO2: 99% 95%    General: Awake, no distress.  CV:  This membranes appear dry, skin turgor is poor, clinically dehydrated. Resp:  Normal effort.  Clear lungs Abd:  No  distention.  Soft and nontender. Other:  Moving all extremities with full active range of motion, neurovascular intact, no signs of head trauma, neck is supple with full range of motion.  He has some skin tears and bruising to bilateral arms.   ED Results / Procedures / Treatments   Labs (all labs ordered are listed, but only abnormal results are displayed) Labs Reviewed  COMPREHENSIVE METABOLIC PANEL - Abnormal; Notable for the following components:      Result Value   Sodium 134 (*)    Glucose, Bld 100 (*)    Creatinine, Ser 1.83 (*)    Total Protein 6.3 (*)    GFR, Estimated 37 (*)    All other components within normal limits  CBC WITH DIFFERENTIAL/PLATELET - Abnormal; Notable for the following components:   RBC 3.48 (*)    Hemoglobin 10.3 (*)    HCT 32.7 (*)    All other components within normal limits  URINALYSIS, ROUTINE W REFLEX MICROSCOPIC - Abnormal; Notable for the following components:   Color, Urine YELLOW (*)  APPearance CLEAR (*)    Leukocytes,Ua TRACE (*)    Bacteria, UA RARE (*)    All other components within normal limits  MAGNESIUM  TROPONIN I (HIGH SENSITIVITY)  TROPONIN I (HIGH SENSITIVITY)     I reviewed labs and they are notable for creatinine 1.83 which is at baseline  EKG  ED ECG REPORT I, Lucillie Garfinkel, the attending physician, personally viewed and interpreted this ECG.   Date: 02/11/2022  EKG Time: 1540  Rate: 61  Rhythm: A paced rhythm.  Prolonged PR 276. Qtc 460  No stemi   RADIOLOGY I dependently reviewed and interpreted CT scan of the head and see no hemorrhage or midline shift   PROCEDURES:  Critical Care performed: No  Procedures   MEDICATIONS ORDERED IN ED: Medications  sodium chloride 0.9 % bolus 1,000 mL (has no administration in time range)  sodium chloride 0.9 % bolus 500 mL (has no administration in time range)    Consultants:  I spoke with hospitalist regarding care plan for this patient.   IMPRESSION / MDM /  ASSESSMENT AND PLAN / ED COURSE  I reviewed the triage vital signs and the nursing notes.                              Differential diagnosis includes, but is not limited to, hydration, infection, arrhythmia or ACS, traumatic injuries including intracranial bleeding, fracture of the right arm.   The patient is on the cardiac monitor to evaluate for evidence of arrhythmia and/or significant heart rate changes.  MDM: This is an 81 year old patient who has had frequent falls and presyncope that appears to be exertional or orthostatic.  Work-up for differential diagnosis as above.  Patient with minor injuries on my exam will assess for trauma with x-rays of the right arm and CT scan of the head, negative.  Remainder of work-up was negative, upon my attempt to ambulate the patient, he felt very weak in both legs and was unable to take but a few steps from his bed.  Plan was to give a small crystalloid bolus reassess the patient.,  However on his cardiac monitoring there was a run of nonsustained ventricular tachycardia around 10 beats.  And given his neurolysed weakness and the fact that he lives alone with increasing frequency of falls, I thought it more appropriate to admit the patient on telemetry monitoring physical therapy evaluation in the morning as well if he continues to have difficulty ambulating  Patient had an episode while on a cardiac monitor of nonsustained VT.  [employer] pacemaker interrogated.   Patient's presentation is most consistent with acute complicated illness / injury requiring diagnostic workup.       FINAL CLINICAL IMPRESSION(S) / ED DIAGNOSES   Final diagnoses:  Recurrent falls  Generalized weakness     Rx / DC Orders   ED Discharge Orders     None        Note:  This document was prepared using Dragon voice recognition software and may include unintentional dictation errors.    Lucillie Garfinkel, MD 02/11/22 [id number]

## 2022-02-11 NOTE — ED Triage Notes (Signed)
First nurse Note:  Arrives from home via [facility].  Unwitnessed fall.  Denies hitting head, denies LOC.  Patient called daughter in law after fall.  Patient sitting up in chair when family arrived.  Patient is c/o some dizziness.  Fall was mechanical due to bilateral leg weakness, which has been ongoing x 3 weeks.  Seen through ED for complaint.  Patient takes Eliquis

## 2022-02-11 NOTE — ED Triage Notes (Signed)
Pt states he has been falling a lot lately has not hit head or LOC but feels like he is dehydrated. Pt has multiple bruises on arms from falls. Pt denies any pain

## 2022-02-11 NOTE — H&P (Incomplete)
[place]   PATIENT NAME: Matthew Zamora    MR#:  [id number]  DATE OF BIRTH:  1940-10-23  DATE OF ADMISSION:  02/11/2022  PRIMARY CARE PHYSICIAN: Maryland Pink, MD   Patient is coming from: Home  REQUESTING/REFERRING PHYSICIAN: Lucillie Garfinkel, MD CHIEF COMPLAINT:   Chief Complaint  Patient presents with   Fall    HISTORY OF PRESENT ILLNESS:  Matthew Zamora is a 81 y.o. male with medical history significant for multiple medical problems that are mentioned below, who presented to the emergency room with acute onset of recurrent falls with generalized weakness, dizziness and lightheadedness as well as presyncope upon standing and was noted to have a 15 beat run of unsustained ventricular tachycardia in the emergency room.  He stated he has not been eating or drinking as much and feels he was dehydrated.  No chest pain or dyspnea or cough or wheezing.  No nausea or vomiting or abdominal pain.  He had a fall a week ago with injury to his right arm and some bruising and skin tears there.  He denied any head injuries and no bony pain.  He denied any loss of consciousness.  No fever or chills.  No dysuria, oliguria or hematuria or flank pain.  No cough or wheezing or dyspnea.  No chest pain or palpitations.  ED Course: In the ER, BP was 93/65 and got better with hydration with otherwise normal vital signs.  Labs revealed mild hyponatremia and a creatinine 1.83 better than previous levels. High sensitive troponin I was 10 and later 12.  Hemoglobin hematocrit were 10.3 and 32.7 close to baseline. EKG as reviewed by me : EKG showed atrial paced rhythm with prolonged AV conduction with a rate of 61 Imaging: Two-view chest x-ray showed no acute cardiopulmonary disease and right shoulder x-ray showed no evidence for fracture or dislocation.  The patient was given half a liter bolus of IV normal saline.  His AICD was interrogated and showed no firing.  He will be admitted to a progressive  unit observation bed for further evaluation and management. 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 pylori infection    History of hepatitis    Hyperlipidemia    Hypertension    Liver cyst 05/16/15   PAF (paroxysmal atrial fibrillation) ([place])    Prostate CA ([place])     PAST SURGICAL HISTORY:   Past Surgical History:  Procedure Laterality Date   CATARACT EXTRACTION     COLONOSCOPY  10/06/2014, 09/18/2004, 03/14/2009   ESOPHAGOGASTRODUODENOSCOPY  10/06/2014, 03/18/2001, 03/14/2009   ESOPHAGOGASTRODUODENOSCOPY (EGD) WITH PROPOFOL N/A 05/07/2018   Procedure: ESOPHAGOGASTRODUODENOSCOPY (EGD) WITH PROPOFOL;  Surgeon: Toledo, [name], MD;  Location: [facility] ENDOSCOPY;  Service: Gastroenterology;  Laterality: N/A;   FLEXIBLE SIGMOIDOSCOPY  08/26/1990   INSERTION OF ICD     PROSTATE SURGERY     TONSILLECTOMY      SOCIAL HISTORY:   Social History   Tobacco Use   Smoking status: Former    Packs/day: 1.00    Years: 38.00    Total pack years:  38.00    Types: Cigarettes    Quit date: 06/18/1999    Years since quitting: 22.6   Smokeless tobacco: Never  Substance Use Topics   Alcohol use: No    Alcohol/week: 0.0 standard drinks of alcohol    FAMILY HISTORY:   Family History  Problem Relation Age of Onset   Heart attack Mother    Heart attack Father     DRUG ALLERGIES:   Allergies  Allergen Reactions   Nitrofuran Derivatives Other (See Comments)    Transaminitis ** confounded w/Amiodarone, Mexiletine    REVIEW OF SYSTEMS:   ROS As per history of present illness. All pertinent systems were reviewed above.  Constitutional, HEENT, cardiovascular, respiratory, GI, GU, musculoskeletal, neuro, psychiatric, endocrine, integumentary and hematologic systems were reviewed and are otherwise negative/unremarkable except for positive findings mentioned above in the HPI.   MEDICATIONS AT HOME:   Prior to Admission medications   Medication Sig Start Date End Date Taking? Authorizing Provider  amiodarone (PACERONE) 400 MG tablet Take 1 tablet (400 mg total) by mouth daily. 12/16/19  Yes Pokhrel, Laxman, MD  aspirin 81 MG chewable tablet Chew 81 mg by mouth daily at 12 noon. 03/30/21  Yes [provider]  atorvastatin (LIPITOR) 80 MG tablet Take 80 mg by mouth daily. 05/13/21  Yes [provider]  ELIQUIS 2.5 MG TABS tablet Take 2.5 mg by mouth 2 (two) times daily. 12/28/21  Yes [provider]  gabapentin (NEURONTIN) 100 MG capsule Take 100 mg by mouth at bedtime. 12/09/19  Yes [provider]  magnesium oxide (MAG-OX) 400 (240 Mg) MG tablet Take 1 tablet by mouth daily. 02/05/21  Yes [provider]  melatonin 3 MG TABS tablet Take 9 mg by mouth at bedtime.   Yes [provider]  metoprolol succinate (TOPROL-XL) 50 MG 24 hr tablet Take 75 mg by mouth 2 (two) times daily. 05/20/21  Yes [provider]  mexiletine (MEXITIL) 150 MG capsule Take 150 mg by mouth 2 (two) times daily. 12/01/19  Yes [provider]  Multiple Vitamin (MULTIVITAMIN WITH MINERALS) TABS tablet Take 1 tablet by mouth daily. 10/29/19  Yes Darel Hong D, NP  omeprazole (PRILOSEC) 20 MG capsule Take 20 mg by mouth every morning. 05/18/21  Yes [provider]  spironolactone (ALDACTONE) 25 MG tablet Take 25 mg by mouth daily. 01/28/22  Yes [provider]  tamsulosin (FLOMAX) 0.4 MG CAPS capsule Take 0.4 mg by mouth daily. 12/09/19  Yes [provider]  torsemide (DEMADEX) 20 MG tablet Take 1 tablet (20 mg total) by mouth daily. 08/12/21 02/11/22 Yes  Wyvonnia Dusky, MD  acetaminophen (TYLENOL) 325 MG tablet Take 2 tablets (650 mg total) by mouth every 4 (four) hours as needed for headache or mild pain. 10/29/19   Bradly Bienenstock, NP  albuterol (PROVENTIL) (2.5 MG/3ML) 0.083% nebulizer solution Inhale 3 mLs (2.5 mg total) into the lungs as needed for shortness of breath. 10/29/19   Bradly Bienenstock, NP  albuterol (VENTOLIN HFA) 108 (90 Base) MCG/ACT inhaler Inhale 2 puffs into the lungs every 4 (four) hours as needed. 04/21/21   [provider]  ascorbic acid (VITAMIN C) 500 MG tablet Take 1 tablet by mouth daily.    [provider]  benzonatate (TESSALON PERLES) 100 MG capsule Take 1 capsule (100 mg total) by mouth every 6 (six) hours as needed for cough. 06/16/21 06/16/22  Lavina Hamman, MD  Nebulizer MISC 1 each by Does not  apply route as needed. 06/16/21   Lavina Hamman, MD  oxymetazoline (AFRIN) 0.05 % nasal spray Place 2 sprays into both nostrils 2 (two) times daily as needed for congestion (nose bleeds). 07/13/21 07/13/22  Naaman Plummer, MD  tadalafil (CIALIS) 20 MG tablet Take 20 mg by mouth daily. 09/04/21   [provider]      VITAL SIGNS:  Blood pressure (!) 100/50, pulse 60, temperature 97.7 F (36.5 C), temperature source Oral, resp. rate 16, height '5\' 9"'$  (1.753 m), weight 65.8 kg, SpO2 98 %.  PHYSICAL EXAMINATION:  Physical Exam  GENERAL:  81 y.o.-year-old Caucasian male patient lying in the bed with no acute distress.  EYES: Pupils equal, round, reactive to light and accommodation. No scleral icterus. Extraocular muscles intact.  HEENT: Head atraumatic, normocephalic. Oropharynx and nasopharynx clear.  NECK:  Supple, no jugular venous distention. No thyroid enlargement, no tenderness.  LUNGS: Normal breath sounds bilaterally, no wheezing, rales,rhonchi or crepitation. No use of accessory muscles of respiration.  CARDIOVASCULAR: Regular rate and rhythm, S1, S2 normal. No murmurs, rubs, or  gallops.  ABDOMEN: Soft, nondistended, nontender. Bowel sounds present. No organomegaly or mass.  EXTREMITIES: No pedal edema, cyanosis, or clubbing.  NEUROLOGIC: Cranial nerves II through XII are intact. Muscle strength 5/5 in all extremities. Sensation intact. Gait not checked.  PSYCHIATRIC: The patient is alert and oriented x 3.  Normal affect and good eye contact. SKIN: No obvious rash, lesion, or ulcer.   LABORATORY PANEL:   CBC Recent Labs  Lab 02/12/22 0340  WBC 5.0  HGB 9.4*  HCT 30.6*  PLT 168   ------------------------------------------------------------------------------------------------------------------  Chemistries  Recent Labs  Lab 02/11/22 1558 02/11/22 2054 02/12/22 0340  NA 134*  --  138  K 4.4  --  3.9  CL 103  --  110  CO2 26  --  24  GLUCOSE 100*  --  110*  BUN 17  --  17  CREATININE 1.83*  --  1.58*  CALCIUM 9.2  --  8.6*  MG  --  2.4  --   AST 29  --   --   ALT 27  --   --   ALKPHOS 65  --   --   BILITOT 0.8  --   --    ------------------------------------------------------------------------------------------------------------------  Cardiac Enzymes No results for input(s): "TROPONINI" in the last 168 hours. ------------------------------------------------------------------------------------------------------------------  RADIOLOGY:  CT Head Wo Contrast  Result Date: 02/11/2022 CLINICAL DATA:  Head and neck trauma. Multiple recent falls. No LOC. Dizziness. EXAM: CT HEAD WITHOUT CONTRAST CT CERVICAL SPINE WITHOUT CONTRAST TECHNIQUE: Multidetector CT imaging of the head and cervical spine was performed following the standard protocol without intravenous contrast. Multiplanar CT image reconstructions of the cervical spine were also generated. RADIATION DOSE REDUCTION: This exam was performed according to the departmental dose-optimization program which includes automated exposure control, adjustment of the mA and/or kV according to patient size  and/or use of iterative reconstruction technique. COMPARISON:  CT examination dated January 29, 2022 FINDINGS: CT HEAD FINDINGS Brain: No evidence of acute infarction, hemorrhage, hydrocephalus, extra-axial collection or mass lesion/mass effect. Patchy area of low-attenuation in the periventricular white matter presumed chronic microvascular ischemic changes. Vascular: No hyperdense vessel or unexpected calcification. Skull: Normal. Negative for fracture or focal lesion. Sinuses/Orbits: No acute finding. Other: None. CT CERVICAL SPINE FINDINGS Alignment: Normal. Skull base and vertebrae: No acute fracture. No primary bone lesion or focal pathologic process. Soft tissues and spinal canal: No prevertebral fluid  or swelling. No visible canal hematoma. 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 Mild multilevel facet joint arthropathy. C2-C3: Mild left and moderate right facet joint arthropathy. No significant spinal canal or neural foraminal stenosis. C3-C4: Moderate facet joint arthropathy with mild left neural foraminal stenosis. C4-C5: Moderate facet joint arthropathy left worse than the right. No significant spinal canal or neural foraminal stenosis. C5-C6: Disc osteophyte complex with mild spinal canal stenosis. Mild bilateral facet joint arthropathy. C6-C7: Disc osteophyte complex with mild spinal canal stenosis. Mild bilateral neural foraminal stenosis. Mild facet joint arthropathy. C7-T1:  No significant finding. Upper chest: Negative. Other: IMPRESSION: CT head: 1.  No acute intracranial abnormality. 2. Mild chronic microvascular ischemic changes of the white matter, unchanged. CT cervical spine: 1. No acute fracture or traumatic subluxation. 2. Paraspinal soft tissues are unremarkable. 3. 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 Electronically Signed   By: Keane Police D.O.   On:  02/11/2022 21:51   CT Cervical Spine Wo Contrast  Result Date: 02/11/2022 CLINICAL DATA:  Head and neck trauma. Multiple recent falls. No LOC. Dizziness. EXAM: CT HEAD WITHOUT CONTRAST CT CERVICAL SPINE WITHOUT CONTRAST TECHNIQUE: Multidetector CT imaging of the head and cervical spine was performed following the standard protocol without intravenous contrast. Multiplanar CT image reconstructions of the cervical spine were also generated. RADIATION DOSE REDUCTION: This exam was performed according to the departmental dose-optimization program which includes automated exposure control, adjustment of the mA and/or kV according to patient size and/or use of iterative reconstruction technique. COMPARISON:  CT examination dated January 29, 2022 FINDINGS: CT HEAD FINDINGS Brain: No evidence of acute infarction, hemorrhage, hydrocephalus, extra-axial collection or mass lesion/mass effect. Patchy area of low-attenuation in the periventricular white matter presumed chronic microvascular ischemic changes. Vascular: No hyperdense vessel or unexpected calcification. Skull: Normal. Negative for fracture or focal lesion. Sinuses/Orbits: No acute finding. Other: None. CT CERVICAL SPINE FINDINGS Alignment: Normal. Skull base and vertebrae: No acute fracture. No primary bone lesion or focal pathologic process. Soft tissues and spinal canal: No prevertebral fluid or swelling. No visible canal hematoma. 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 Mild multilevel facet joint arthropathy. C2-C3: Mild left and moderate right facet joint arthropathy. No significant spinal canal or neural foraminal stenosis. C3-C4: Moderate facet joint arthropathy with mild left neural foraminal stenosis. C4-C5: Moderate facet joint arthropathy left worse than the right. No significant spinal canal or neural foraminal stenosis. C5-C6: Disc osteophyte complex with mild spinal canal stenosis. Mild  bilateral facet joint arthropathy. C6-C7: Disc osteophyte complex with mild spinal canal stenosis. Mild bilateral neural foraminal stenosis. Mild facet joint arthropathy. C7-T1:  No significant finding. Upper chest: Negative. Other: IMPRESSION: CT head: 1.  No acute intracranial abnormality. 2. Mild chronic microvascular ischemic changes of the white matter, unchanged. CT cervical spine: 1. No acute fracture or traumatic subluxation. 2. Paraspinal soft tissues are unremarkable. 3. 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 Electronically Signed   By: Keane Police D.O.   On: 02/11/2022 21:51   DG Shoulder Right  Result Date: 02/11/2022 CLINICAL DATA:  Weakness and dizziness. Right shoulder pain after falling. EXAM: RIGHT SHOULDER - 2+ VIEW COMPARISON:  None Available. FINDINGS: The bones appear mildly demineralized. No evidence of acute fracture or dislocation. There are mild glenohumeral and acromioclavicular degenerative changes. The subacromial space appears adequately  preserved. Left subclavian AICD leads are partially imaged. IMPRESSION: No evidence of acute fracture or dislocation. Electronically Signed   By: Richardean Sale M.D.   On: 02/11/2022 16:28   DG Chest 2 View  Result Date: 02/11/2022 CLINICAL DATA:  Weakness. EXAM: CHEST - 2 VIEW COMPARISON:  Chest x-ray 01/29/2022 FINDINGS: Cardiomediastinal silhouette is within normal limits. Left-sided pacemaker is present. The lungs are clear. There is no pleural effusion or pneumothorax. No acute fractures are identified. IMPRESSION: No active cardiopulmonary disease. Electronically Signed   By: Ronney Asters M.D.   On: 02/11/2022 16:26      IMPRESSION AND PLAN:  Assessment and Plan: * NSVT (nonsustained ventricular tachycardia) ([name]) - This could be contributing to his recurrent falls and generalized weakness as well as presyncope. - The patient is status post AICD with  no discharge. - He will be admitted to an observation progressive unit bed. - We will monitor for further arrhythmias. - We will optimize his electrolytes. - Cardiology consult will be obtained. - I notified Dr. Leonides Schanz about the patient. - PT consult to be obtained.  Paroxysmal atrial fibrillation ([name]) - We will continue amiodarone and Mexitil.  GERD without esophagitis - We will continue PPI therapy.  CKD (chronic kidney disease) stage 3, GFR 30-59 ml/min ([name]) - The patient has underlying stage IIIb chronic kidney disease which has been fairly stable.  His creatinine is actually better than before. - We will place him on hydration while he is here and follow his creatinine and BUN.  Hyperlipidemia - We will continue statin therapy.   DVT prophylaxis: Lovenox.  Advanced Care Planning:  Code Status: full code.  Family Communication:  The plan of care was discussed in details with the patient (and family). I answered all questions. The patient agreed to proceed with the above mentioned plan. Further management will depend upon hospital course. Disposition Plan: Back to previous home environment Consults called: Cardiology. All the records are reviewed and case discussed with ED provider.  Status is: Observation   I certify that at the time of admission, it is my clinical judgment that the patient will require hospital care extending less than 2 midnights.                            Dispo: The patient is from: Home              Anticipated d/c is to: Home              Patient currently is not medically stable to d/c.              Difficult to place patient: No  Christel Mormon M.D on 02/12/2022 at 5:21 AM  [facility]   From 7 PM-7 AM, contact night-coverage [email or web address]  CC: Primary care physician; Maryland Pink, MD

## 2022-02-12 DIAGNOSIS — K219 Gastro-esophageal reflux disease without esophagitis: Secondary | ICD-10-CM

## 2022-02-12 DIAGNOSIS — R296 Repeated falls: Secondary | ICD-10-CM | POA: Insufficient documentation

## 2022-02-12 DIAGNOSIS — R531 Weakness: Secondary | ICD-10-CM | POA: Insufficient documentation

## 2022-02-12 DIAGNOSIS — I4729 Other ventricular tachycardia: Secondary | ICD-10-CM | POA: Diagnosis not present

## 2022-02-12 LAB — BASIC METABOLIC PANEL
Anion gap: 4 — ABNORMAL LOW (ref 5–15)
BUN: 17 mg/dL (ref 8–23)
CO2: 24 mmol/L (ref 22–32)
Calcium: 8.6 mg/dL — ABNORMAL LOW (ref 8.9–10.3)
Chloride: 110 mmol/L (ref 98–111)
Creatinine, Ser: 1.58 mg/dL — ABNORMAL HIGH (ref 0.61–1.24)
GFR, Estimated: 44 mL/min — ABNORMAL LOW (ref >60)
Glucose, Bld: 110 mg/dL — ABNORMAL HIGH (ref 70–99)
Potassium: 3.9 mmol/L (ref 3.5–5.1)
Sodium: 138 mmol/L (ref 135–145)

## 2022-02-12 LAB — CBC
HCT: 30.6 % — ABNORMAL LOW (ref 39.0–52.0)
Hemoglobin: 9.4 g/dL — ABNORMAL LOW (ref 13.0–17.0)
MCH: 29.6 pg (ref 26.0–34.0)
MCHC: 30.7 g/dL (ref 30.0–36.0)
MCV: 96.2 fL (ref 80.0–100.0)
Platelets: 168 10*3/uL (ref 150–400)
RBC: 3.18 MIL/uL — ABNORMAL LOW (ref 4.22–5.81)
RDW: 14.6 % (ref 11.5–15.5)
WBC: 5 10*3/uL (ref 4.0–10.5)
nRBC: 0 % (ref 0.0–0.2)

## 2022-02-12 LAB — BRAIN NATRIURETIC PEPTIDE: B Natriuretic Peptide: 355.9 pg/mL — ABNORMAL HIGH (ref 0.0–100.0)

## 2022-02-12 MED ORDER — PANTOPRAZOLE SODIUM 40 MG PO TBEC
40.0000 mg | DELAYED_RELEASE_TABLET | Freq: Every day | ORAL | Status: DC
  Administered 2022-02-12 – 2022-02-13 (×2): 40 mg via ORAL
  Filled 2022-02-12 (×2): qty 1

## 2022-02-12 MED ORDER — METOPROLOL SUCCINATE ER 50 MG PO TB24
25.0000 mg | ORAL_TABLET | Freq: Two times a day (BID) | ORAL | Status: DC
  Administered 2022-02-12 – 2022-02-13 (×2): 25 mg via ORAL
  Filled 2022-02-12 (×2): qty 1

## 2022-02-12 MED ORDER — ACETAMINOPHEN 325 MG PO TABS: 650.0000 mg | ORAL_TABLET | Freq: Four times a day (QID) | ORAL | Status: DC | PRN

## 2022-02-12 MED ORDER — GABAPENTIN 100 MG PO CAPS
100.0000 mg | ORAL_CAPSULE | Freq: Every day | ORAL | Status: DC
  Administered 2022-02-12 (×2): 100 mg via ORAL
  Filled 2022-02-12 (×2): qty 1

## 2022-02-12 MED ORDER — BISACODYL 10 MG RE SUPP: 10.0000 mg | Freq: Every day | RECTAL | Status: DC | PRN

## 2022-02-12 MED ORDER — ADULT MULTIVITAMIN W/MINERALS CH
1.0000 | ORAL_TABLET | Freq: Every day | ORAL | Status: DC
  Administered 2022-02-12 – 2022-02-13 (×2): 1 via ORAL
  Filled 2022-02-12 (×2): qty 1

## 2022-02-12 MED ORDER — SENNOSIDES-DOCUSATE SODIUM 8.6-50 MG PO TABS: 2.0000 | ORAL_TABLET | Freq: Every day | ORAL | Status: DC | PRN

## 2022-02-12 MED ORDER — ONDANSETRON HCL 4 MG PO TABS: 4.0000 mg | ORAL_TABLET | Freq: Four times a day (QID) | ORAL | Status: DC | PRN

## 2022-02-12 MED ORDER — ALBUTEROL SULFATE (2.5 MG/3ML) 0.083% IN NEBU
2.5000 mg | INHALATION_SOLUTION | RESPIRATORY_TRACT | Status: DC | PRN
Start: 2022-02-12 — End: 2022-02-13

## 2022-02-12 MED ORDER — ASPIRIN 81 MG PO CHEW
81.0000 mg | CHEWABLE_TABLET | Freq: Every day | ORAL | Status: DC
  Administered 2022-02-12 – 2022-02-13 (×2): 81 mg via ORAL
  Filled 2022-02-12 (×2): qty 1

## 2022-02-12 MED ORDER — SODIUM CHLORIDE 0.9 % IV SOLN: INTRAVENOUS | Status: DC

## 2022-02-12 MED ORDER — METOPROLOL SUCCINATE ER 50 MG PO TB24: 75.0000 mg | ORAL_TABLET | Freq: Two times a day (BID) | ORAL | Status: DC

## 2022-02-12 MED ORDER — ONDANSETRON HCL 4 MG/2ML IJ SOLN: 4.0000 mg | Freq: Four times a day (QID) | INTRAMUSCULAR | Status: DC | PRN

## 2022-02-12 MED ORDER — SPIRONOLACTONE 25 MG PO TABS: 25.0000 mg | ORAL_TABLET | Freq: Every day | ORAL | Status: DC

## 2022-02-12 MED ORDER — METOPROLOL SUCCINATE ER 50 MG PO TB24: 50.0000 mg | ORAL_TABLET | Freq: Two times a day (BID) | ORAL | Status: DC

## 2022-02-12 MED ORDER — TRAZODONE HCL 50 MG PO TABS: 25.0000 mg | ORAL_TABLET | Freq: Every evening | ORAL | Status: DC | PRN

## 2022-02-12 MED ORDER — MELATONIN 5 MG PO TABS
10.0000 mg | ORAL_TABLET | Freq: Every day | ORAL | Status: DC
  Administered 2022-02-12: 10 mg via ORAL
  Filled 2022-02-12: qty 2

## 2022-02-12 MED ORDER — MAGNESIUM OXIDE -MG SUPPLEMENT 400 (240 MG) MG PO TABS
400.0000 mg | ORAL_TABLET | Freq: Every day | ORAL | Status: DC
  Administered 2022-02-12 – 2022-02-13 (×2): 400 mg via ORAL
  Filled 2022-02-12 (×2): qty 1

## 2022-02-12 MED ORDER — TORSEMIDE 20 MG PO TABS: 20.0000 mg | ORAL_TABLET | Freq: Every day | ORAL | 0 refills | Status: DC | PRN

## 2022-02-12 MED ORDER — APIXABAN 2.5 MG PO TABS
2.5000 mg | ORAL_TABLET | Freq: Two times a day (BID) | ORAL | Status: DC
  Administered 2022-02-12 – 2022-02-13 (×4): 2.5 mg via ORAL
  Filled 2022-02-12 (×4): qty 1

## 2022-02-12 MED ORDER — METOPROLOL SUCCINATE ER 50 MG PO TB24
75.0000 mg | ORAL_TABLET | Freq: Two times a day (BID) | ORAL | Status: DC
  Administered 2022-02-12: 75 mg via ORAL
  Filled 2022-02-12: qty 2

## 2022-02-12 MED ORDER — MEXILETINE HCL 150 MG PO CAPS
150.0000 mg | ORAL_CAPSULE | Freq: Two times a day (BID) | ORAL | Status: DC
  Administered 2022-02-12 – 2022-02-13 (×4): 150 mg via ORAL
  Filled 2022-02-12 (×4): qty 1

## 2022-02-12 MED ORDER — TORSEMIDE 20 MG PO TABS
20.0000 mg | ORAL_TABLET | Freq: Every day | ORAL | Status: DC
Start: 2022-02-12 — End: 2022-02-12

## 2022-02-12 MED ORDER — BENZONATATE 100 MG PO CAPS: 100.0000 mg | ORAL_CAPSULE | Freq: Four times a day (QID) | ORAL | Status: DC | PRN

## 2022-02-12 MED ORDER — TAMSULOSIN HCL 0.4 MG PO CAPS
0.4000 mg | ORAL_CAPSULE | Freq: Every day | ORAL | Status: DC
  Administered 2022-02-12 – 2022-02-13 (×2): 0.4 mg via ORAL
  Filled 2022-02-12 (×2): qty 1

## 2022-02-12 MED ORDER — MAGNESIUM HYDROXIDE 400 MG/5ML PO SUSP: 30.0000 mL | Freq: Every day | ORAL | Status: DC | PRN

## 2022-02-12 MED ORDER — ACETAMINOPHEN 325 MG RE SUPP: 650.0000 mg | Freq: Four times a day (QID) | RECTAL | Status: DC | PRN

## 2022-02-12 MED ORDER — OXYMETAZOLINE HCL 0.05 % NA SOLN: 2.0000 | Freq: Two times a day (BID) | NASAL | Status: DC | PRN

## 2022-02-12 MED ORDER — ATORVASTATIN CALCIUM 20 MG PO TABS
80.0000 mg | ORAL_TABLET | Freq: Every day | ORAL | Status: DC
  Administered 2022-02-12 – 2022-02-13 (×2): 80 mg via ORAL
  Filled 2022-02-12 (×2): qty 4

## 2022-02-12 MED ORDER — AMIODARONE HCL 200 MG PO TABS
400.0000 mg | ORAL_TABLET | Freq: Every day | ORAL | Status: DC
  Administered 2022-02-12 – 2022-02-13 (×2): 400 mg via ORAL
  Filled 2022-02-12 (×2): qty 2

## 2022-02-12 MED ORDER — VITAMIN C 500 MG PO TABS
500.0000 mg | ORAL_TABLET | Freq: Every day | ORAL | Status: DC
  Administered 2022-02-12 – 2022-02-13 (×2): 500 mg via ORAL
  Filled 2022-02-12 (×2): qty 1

## 2022-02-12 MED ORDER — METOPROLOL SUCCINATE ER 25 MG PO TB24: 25.0000 mg | ORAL_TABLET | Freq: Two times a day (BID) | ORAL | 0 refills | Status: DC

## 2022-02-12 NOTE — NC FL2 (Signed)
[place] LEVEL OF CARE SCREENING TOOL     IDENTIFICATION  Patient Name: Matthew Zamora Birthdate: 14-May-1941 Sex: male Admission Date (Current Location): 02/11/2022  [facility] and [place] Number:  Engineering geologist and Address:  [facility], [address], [place], [place] [address]      Provider Number: [id number]  Attending Physician Name and Address:  Emeterio Reeve, DO  Relative Name and Phone Number:  Tyre Beaver -son- [phone]    Current Level of Care: Hospital Recommended Level of Care: [facility] Prior Approval Number:    Date Approved/Denied:   PASRR Number: [id number]  Discharge Plan: SNF    Current Diagnoses: Patient Active Problem List   Diagnosis Date Noted   GERD without esophagitis 02/12/2022   Recurrent falls    Generalized weakness    NSVT (nonsustained ventricular tachycardia) ([place]) 02/11/2022   Acute on chronic combined systolic (congestive) and diastolic (congestive) heart failure ([place]) 08/10/2021   Acute respiratory failure with hypoxia ([place])    HCAP (healthcare-associated pneumonia) 06/13/2021   Acute on chronic systolic CHF (congestive heart failure) ([place]) 06/13/2021   Elevated troponin 06/13/2021   CKD (chronic kidney disease), 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   Lung nodule 06/13/2021   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   COPD exacerbation ([place]) 06/13/2021   Volume depletion 12/15/2019   Hypotension 12/15/2019   Cardiorenal syndrome    Atrial fibrillation with RVR ([place])    Defibrillator discharge    Tachycardia 10/25/2019   CHF (congestive heart failure) ([place]) 09/18/2019   Acute on chronic combined systolic and diastolic CHF (congestive heart failure) ([place]) 09/17/2019   CAD (coronary artery disease) 09/17/2019   Ischemic cardiomyopathy 09/17/2019   CKD (chronic kidney disease) stage 3, GFR 30-59 ml/min ([name]) 09/17/2019   Acute hypoxemic respiratory failure ([place]) 09/17/2018   AAA  (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 ([place]) 07/06/2018   Essential hypertension 07/06/2018   Hyperlipidemia 07/06/2018   Paroxysmal atrial fibrillation ([name])    Acute on chronic respiratory failure with hypoxia ([place]) 04/26/2018    Orientation RESPIRATION BLADDER Height & Weight     Self, Time, Situation, Place  O2 (O2 2L at night) Continent Weight: 65.8 kg Height:  '5\' 9"'$  (175.3 cm)  BEHAVIORAL SYMPTOMS/MOOD NEUROLOGICAL BOWEL NUTRITION STATUS      Continent Diet (Heart Healthy)  AMBULATORY STATUS COMMUNICATION OF NEEDS Skin   Limited Assist Verbally Bruising, Skin abrasions                       Personal Care Assistance Level of Assistance  Bathing, Feeding, Dressing Bathing Assistance: Limited assistance Feeding assistance: Independent Dressing Assistance: Limited assistance     Functional Limitations Info             SPECIAL CARE FACTORS FREQUENCY  PT (By licensed PT), OT (By licensed OT)     PT Frequency: 5 times per week OT Frequency: 5 times per week            Contractures Contractures Info: Not present    Additional Factors Info  Code Status, Allergies Code Status Info: Full Allergies Info: Nitrofuran Derivatives, spironolactone           Current Medications (02/12/2022):  This is the current hospital active medication list Current Facility-Administered Medications  Medication Dose Route Frequency Provider Last Rate Last Admin   0.9 %  sodium chloride infusion   Intravenous Continuous Mansy, Jan A, MD 100 mL/hr at 02/12/22 0330 New Bag at 02/12/22 0330  acetaminophen (TYLENOL) tablet 650 mg  650 mg Oral Q6H PRN Mansy, Jan A, MD       Or   acetaminophen (TYLENOL) suppository 650 mg  650 mg Rectal Q6H PRN Mansy, Jan A, MD       albuterol (PROVENTIL) (2.5 MG/3ML) 0.083% nebulizer solution 2.5 mg  2.5 mg Inhalation PRN Mansy, Jan A, MD       amiodarone (PACERONE) tablet 400 mg  400 mg Oral Daily Mansy, Jan A, MD   400 mg at 02/12/22 0957    apixaban (ELIQUIS) tablet 2.5 mg  2.5 mg Oral BID Mansy, Jan A, MD   2.5 mg at 02/12/22 1003   ascorbic acid (VITAMIN C) tablet 500 mg  500 mg Oral Daily Mansy, Jan A, MD   500 mg at 02/12/22 7026   aspirin chewable tablet 81 mg  81 mg Oral Q1200 Mansy, Jan A, MD   81 mg at 02/12/22 1145   atorvastatin (LIPITOR) tablet 80 mg  80 mg Oral Daily Mansy, Jan A, MD   80 mg at 02/12/22 1011   benzonatate (TESSALON) capsule 100 mg  100 mg Oral Q6H PRN Mansy, Jan A, MD       bisacodyl (DULCOLAX) suppository 10 mg  10 mg Rectal Daily PRN Emeterio Reeve, DO       gabapentin (NEURONTIN) capsule 100 mg  100 mg Oral QHS Mansy, Jan A, MD   100 mg at 02/12/22 0330   magnesium hydroxide (MILK OF MAGNESIA) suspension 30 mL  30 mL Oral Daily PRN Mansy, Jan A, MD       magnesium oxide (MAG-OX) tablet 400 mg  400 mg Oral Daily Mansy, Jan A, MD   400 mg at 02/12/22 1011   melatonin tablet 10 mg  10 mg Oral QHS Mansy, Jan A, MD       metoprolol succinate (TOPROL-XL) 24 hr tablet 75 mg  75 mg Oral BID Tristan Schroeder, PA-C   75 mg at 02/12/22 3785   mexiletine (MEXITIL) capsule 150 mg  150 mg Oral BID Mansy, Jan A, MD   150 mg at 02/12/22 1001   multivitamin with minerals tablet 1 tablet  1 tablet Oral Daily Mansy, Jan A, MD   1 tablet at 02/12/22 0959   ondansetron (ZOFRAN) tablet 4 mg  4 mg Oral Q6H PRN Mansy, Jan A, MD       Or   ondansetron [facility]) injection 4 mg  4 mg Intravenous Q6H PRN Mansy, Jan A, MD       oxymetazoline (AFRIN) 0.05 % nasal spray 2 spray  2 spray Each Nare BID PRN Mansy, Jan A, MD       pantoprazole (PROTONIX) EC tablet 40 mg  40 mg Oral Daily Mansy, Jan A, MD   40 mg at 02/12/22 8850   senna-docusate (Senokot-S) tablet 2 tablet  2 tablet Oral Daily PRN Emeterio Reeve, DO       tamsulosin [facility]) capsule 0.4 mg  0.4 mg Oral Daily Mansy, Jan A, MD   0.4 mg at 02/12/22 2774   traZODone (DESYREL) tablet 25 mg  25 mg Oral QHS PRN Mansy, Arvella Merles, MD       Current Outpatient Medications   Medication Sig Dispense Refill   amiodarone (PACERONE) 400 MG tablet Take 1 tablet (400 mg total) by mouth daily.     aspirin 81 MG chewable tablet Chew 81 mg by mouth daily at 12 noon.     atorvastatin (LIPITOR) 80 MG  tablet Take 80 mg by mouth daily.     ELIQUIS 2.5 MG TABS tablet Take 2.5 mg by mouth 2 (two) times daily.     gabapentin (NEURONTIN) 100 MG capsule Take 100 mg by mouth at bedtime.     magnesium oxide (MAG-OX) 400 (240 Mg) MG tablet Take 1 tablet by mouth daily.     melatonin 3 MG TABS tablet Take 9 mg by mouth at bedtime.     metoprolol succinate (TOPROL-XL) 50 MG 24 hr tablet Take 75 mg by mouth 2 (two) times daily.     mexiletine (MEXITIL) 150 MG capsule Take 150 mg by mouth 2 (two) times daily.     Multiple Vitamin (MULTIVITAMIN WITH MINERALS) TABS tablet Take 1 tablet by mouth daily.     omeprazole (PRILOSEC) 20 MG capsule Take 20 mg by mouth every morning.     tamsulosin (FLOMAX) 0.4 MG CAPS capsule Take 0.4 mg by mouth daily.     acetaminophen (TYLENOL) 325 MG tablet Take 2 tablets (650 mg total) by mouth every 4 (four) hours as needed for headache or mild pain.     albuterol (PROVENTIL) (2.5 MG/3ML) 0.083% nebulizer solution Inhale 3 mLs (2.5 mg total) into the lungs as needed for shortness of breath. 75 mL 12   albuterol (VENTOLIN HFA) 108 (90 Base) MCG/ACT inhaler Inhale 2 puffs into the lungs every 4 (four) hours as needed.     ascorbic acid (VITAMIN C) 500 MG tablet Take 1 tablet by mouth daily.     benzonatate (TESSALON PERLES) 100 MG capsule Take 1 capsule (100 mg total) by mouth every 6 (six) hours as needed for cough. 30 capsule 0   Nebulizer MISC 1 each by Does not apply route as needed. 1 each 0   oxymetazoline (AFRIN) 0.05 % nasal spray Place 2 sprays into both nostrils 2 (two) times daily as needed for congestion (nose bleeds). 15 mL 1   tadalafil (CIALIS) 20 MG tablet Take 20 mg by mouth daily.     torsemide (DEMADEX) 20 MG tablet Take 1 tablet (20 mg  total) by mouth daily as needed. for up to 3 days for increased leg swelling, shortness of breath, weight gain 5+ lbs over 1-2 days. Seek medical care if these symptoms are not improving with increased dose. 30 tablet 0     Discharge Medications: Please see discharge summary for a list of discharge medications.  Relevant Imaging Results:  Relevant Lab Results:   Additional Information SSN [id number]  Shelbie Hutching, RN

## 2022-02-12 NOTE — ED Notes (Signed)
Pt brought back from lobby to room 19. Pt states he lives at home alone. Pt states multiple falls over past few days associated with near syncope and bilateral leg weakness. Uses walker to get around. On blood thinner, denies head injury or LOC. C/o right arm soreness following falls. Has skin tear to right upper arm that was dressed with xeroform, curlex, and coban d/t active bleeding. Has several bruises. Urinal provided for pt. IV L hand placed, and pending labs sent. Pt denies CP/SOB. Connected to VS monitoring. Call light within reach. Pt denies any additional needs.

## 2022-02-12 NOTE — Assessment & Plan Note (Signed)
-   The patient has underlying stage IIIb chronic kidney disease which has been fairly stable.  His creatinine is actually better than before. - We will place him on hydration while he is here and follow his creatinine and BUN.

## 2022-02-12 NOTE — ED Notes (Signed)
Patient resting in bed, watching TV. Resp even, unlabored on RA. Denies pain, shortness of breath or dizziness. Positioned for comfort. No distress noted at this time.

## 2022-02-12 NOTE — Assessment & Plan Note (Signed)
-   We will continue statin therapy. 

## 2022-02-12 NOTE — Hospital Course (Addendum)
Matthew Zamora is a 81 y.o. male with medical history significant for multiple medical problems that are mentioned below, who presented to the emergency room with acute onset of recurrent falls with generalized weakness, dizziness and lightheadedness as well as presyncope upon standing and was noted to have a 15 beat run of unsustained ventricular tachycardia in the emergency room.  He stated he has not been eating or drinking as much and feels he was dehydrated.  ED Course: In the ER, BP was 93/65 and got better with hydration with otherwise normal vital signs.  Labs revealed mild hyponatremia and a creatinine 1.83 better than previous levels. High sensitive troponin I was 10 and later 12.  Hemoglobin hematocrit were 10.3 and 32.7 close to baseline. EKG atrial paced rhythm with prolonged AV conduction with a rate of 61 Imaging: Two-view chest x-ray showed no acute cardiopulmonary disease and right shoulder x-ray showed no evidence for fracture or dislocation. Treatment: The patient was given half a liter bolus of IV normal saline.  His AICD was interrogated and showed no firing.  He will be admitted to a progressive unit observation bed for further evaluation and management. 08/29: Cardiology recommendations: No arrhythmia detected on device interrogation, telemetry strip of concern for VT was possible ventricular paced beats due to slow HR.  Discontinued spironolactone, do not restart, due to significant transaminitis.  Cleared for discharge from cardiology standpoint if able to ambulate/symptoms have resolved.  Patient reports improvement post fluids, reduced p.o. intake at home.   Consultants:  Cardiology  Procedures: none      ASSESSMENT & PLAN:   Principal Problem:   NSVT (nonsustained ventricular tachycardia) ([name]) Active Problems:   Paroxysmal atrial fibrillation ([name])   Hyperlipidemia   CKD (chronic kidney disease) stage 3, GFR 30-59 ml/min ([name])   GERD without  esophagitis   NSVT (nonsustained ventricular tachycardia) ([place]) Likely a cause of recurrent falls and generalized weakness as well as presyncope. The patient is status post AICD with no discharge. No arrhythmia detected on device interrogation, telemetry strip of concern for VT was possible ventricular paced beats due to slow HR.   Discontinued spironolactone, do not restart, due to significant transaminitis.    Patient reports improvement post fluids, reduced p.o. intake at home. Encourage p.o. fluid intake to 5 or 6 16 ounce bottles per day continue torsemide 20 mg as needed for weight gain / edema Cleared for discharge from cardiology standpoint if able to ambulate/symptoms have resolved.    Paroxysmal atrial fibrillation ([name]) continue amiodarone and mexitil Continue Eliquis Toprol XL dose reduced d/t soft BP and concern for fall risk   Hyperlipidemia continue statin therapy.  GERD without esophagitis continue PPI therapy.  CKD (chronic kidney disease) stage 3, GFR 30-59 ml/min ([name]) Monitor BMP  Debility and falls PT recommending SNF Medication changes as above   DVT prophylaxis: eliquis Pertinent IV fluids/nutrition: IV fluids 1L given Central lines / invasive devices: none  Code Status: FULL Family Communication: none at this time  Disposition: SNF for rehab, ok for discharge per cardiology  TOC needs: SNF Barriers to discharge / significant pending items: SNF placement, watching BP for now as well

## 2022-02-12 NOTE — Evaluation (Signed)
Physical Therapy Evaluation Patient Details Name: Matthew Zamora MRN: [id number] DOB: 05/26/1941 Today's Date: 02/12/2022  History of Present Illness  Per MD:  Matthew Zamora is a 81 y.o. male with PMH of CAD, CHF, pacemaker, A-fib on Eliquis who presents with progressive generalized weakness over the last several weeks and some frequent falls.  He has presyncope symptoms upon standing, lightheadedness, needs to lower himself down to the ground and rest for resolution of symptoms.  He thinks he may be dehydrated because he is not eating and drinking as much.    Clinical Impression  Pt received in Semi-Fowler's position and agreeable to therapy.  Pt states that he is ready to be discharged and go home and is hoping that he can be discharged before 11 so that his DIL would be able to pick him up and take him home.  Pt moves slowly in bed and relies heavily on bed features and therapist for HHA in order to perform bed mobility.  Pt notes he does not use a walker all the time and would like to attempt without AD.  Pt very unsteady and unable to take any steps forward without loss of balance.  Pt sat EOB and was given RW.  Pt more stable, but still has posterior lean and staggers both left and right requiring assistance from therapist to prevent uncontrolled descent to the floor.  Pt transitioned to bed and nursing, TOC, and MD notified of performance and not being safe to d/c home.  Current discharge plans to SNF are most appropriate at this time.  Pt will continue to benefit from skilled therapy in order to address deficits listed below.        Recommendations for follow up therapy are one component of a multi-disciplinary discharge planning process, led by the attending physician.  Recommendations may be updated based on patient status, additional functional criteria and insurance authorization.  Follow Up Recommendations Skilled nursing-short term rehab (<3 hours/day) Can patient physically  be transported by private vehicle: No    Assistance Recommended at Discharge Frequent or constant Supervision/Assistance  Patient can return home with the following  A lot of help with walking and/or transfers;A lot of help with bathing/dressing/bathroom;Assistance with cooking/housework;Help with stairs or ramp for entrance    Equipment Recommendations  (Defer to next venue of care.)  Recommendations for Other Services       Functional Status Assessment Patient has had a recent decline in their functional status and demonstrates the ability to make significant improvements in function in a reasonable and predictable amount of time.     Precautions / Restrictions Precautions Precautions: Fall Restrictions Weight Bearing Restrictions: No      Mobility  Bed Mobility Overal bed mobility: Needs Assistance Bed Mobility: Supine to Sit, Sit to Supine     Supine to sit: Min assist Sit to supine: Min assist   General bed mobility comments: Pt requierd HHA from therapist to perform transition to sitting EOB and uses handrails during sit to supine.    Transfers Overall transfer level: Needs assistance Equipment used: Rolling walker (2 wheels) Transfers: Sit to/from Stand Sit to Stand: Min guard           General transfer comment: Pt very shaky and unsteady upon standing at EOB.    Ambulation/Gait Ambulation/Gait assistance: Min guard Gait Distance (Feet): 3 Feet Assistive device: Rolling walker (2 wheels) Gait Pattern/deviations: Step-to pattern, Staggering left, Staggering right Gait velocity: decreased     General Gait Details:  Pt very unsteady and staggering in all directions, specifically posteriorly.  Stairs            Wheelchair Mobility    Modified Rankin (Stroke Patients Only)       Balance Overall balance assessment: Needs assistance Sitting-balance support: Feet supported Sitting balance-Leahy Scale: Fair     Standing balance support:  Bilateral upper extremity supported Standing balance-Leahy Scale: Poor Standing balance comment: Pt attempted to stand without use of UE's for support and was unable to do so.  With walker, pt still very unsteady.                             Pertinent Vitals/Pain Pain Assessment Pain Assessment: No/denies pain    Home Living Family/patient expects to be discharged to:: Private residence Living Arrangements: Alone Available Help at Discharge: Family;Available PRN/intermittently Type of Home: Mobile home Home Access: Ramped entrance       Home Layout: One level Home Equipment: Rollator (4 wheels);Rolling Walker (2 wheels);Cane - single point;Wheelchair - manual;Toilet riser Additional Comments: daughter in law comes by every morning and brings breakfast.  She's only about 2 miles away and is a Therapist, sports.    Prior Function Prior Level of Function : Independent/Modified Independent             Mobility Comments: Pt reports independence with mobility without AD, can feed neighbor's dogs ADLs Comments: Daughter-in-law brings him breakfast & dinner daily     Hand Dominance   Dominant Hand: Right    Extremity/Trunk Assessment   Upper Extremity Assessment Upper Extremity Assessment: Overall WFL for tasks assessed    Lower Extremity Assessment Lower Extremity Assessment: Generalized weakness       Communication   Communication: No difficulties  Cognition Arousal/Alertness: Awake/alert Behavior During Therapy: WFL for tasks assessed/performed Overall Cognitive Status: Within Functional Limits for tasks assessed                                          General Comments      Exercises     Assessment/Plan    PT Assessment Patient needs continued PT services  PT Problem List Decreased strength;Decreased activity tolerance;Decreased balance;Decreased mobility;Decreased knowledge of use of DME;Decreased safety awareness       PT Treatment  Interventions DME instruction;Gait training;Stair training;Functional mobility training;Therapeutic activities;Therapeutic exercise;Balance training;Neuromuscular re-education    PT Goals (Current goals can be found in the Care Plan section)  Acute Rehab PT Goals Patient Stated Goal: to go to rehab and get better. PT Goal Formulation: With patient Time For Goal Achievement: 02/26/22 Potential to Achieve Goals: Good    Frequency Min 2X/week     Co-evaluation               AM-PAC PT "6 Clicks" Mobility  Outcome Measure Help needed turning from your back to your side while in a flat bed without using bedrails?: A Lot Help needed moving from lying on your back to sitting on the side of a flat bed without using bedrails?: A Lot Help needed moving to and from a bed to a chair (including a wheelchair)?: A Lot Help needed standing up from a chair using your arms (e.g., wheelchair or bedside chair)?: A Lot Help needed to walk in hospital room?: A Lot Help needed climbing 3-5 steps with a railing? : A Lot  6 Click Score: 12    End of Session Equipment Utilized During Treatment: Gait belt Activity Tolerance: Other (comment) (Pt limited by instability/balance deficits.) Patient left: in bed;with call bell/phone within reach;with bed alarm set Nurse Communication: Mobility status PT Visit Diagnosis: Unsteadiness on feet (R26.81);Other abnormalities of gait and mobility (R26.89);Repeated falls (R29.6);Muscle weakness (generalized) (M62.81);History of falling (Z91.81);Difficulty in walking, not elsewhere classified (R26.2)    Time: [id number] PT Time Calculation (min) (ACUTE ONLY): 27 min   Charges:   PT Evaluation $PT Eval Low Complexity: 1 Low PT Treatments $Therapeutic Activity: 8-22 mins        Gwenlyn Saran, PT, DPT 02/12/22, 1:22 PM   Christie Nottingham 02/12/2022, 1:18 PM

## 2022-02-12 NOTE — Assessment & Plan Note (Addendum)
-   We will continue amiodarone and Mexitil.

## 2022-02-12 NOTE — Consult Note (Signed)
[name]       Patient ID: Matthew Zamora MRN: [id number] DOB/AGE: Sep 22, 1940 81 y.o.  Admit date: 02/11/2022 Referring Physician Dr. Sheppard Coil  Primary Physician Dr. [place] Pink Primary Cardiologist Dr. Lovena Neighbours [facility]  Reason for Consultation frequent falls, NSVT on tele   HPI: Matthew Zamora is an 81yoM with a PMH of CAD s/p PCI with DES x3 to RCA 2019, LHC in 2022 with mild disease, ICM (LVEF 40% 11/2021), paroxysmal atrial fibrillation on Eliquis, VT s/p [employer] ICD 08/2017, hypertension, hyperlipidemia who presented to [facility] ED 02/11/2022 with recurrent falls, generalized weakness, dizziness and lightheadedness, and presyncope with standing.  Cardiology is consulted for further assistance.  The patient presents with his daughter-in-law by phone who contributes to the history.  Patient says yesterday afternoon he was wanting to get some food for himself when he stood up to use his 4 wheeled walker when "his legs just gave out on him" and he fell to the floor.  As soon as he stood up he felt dizzy and lightheaded, but denies losing consciousness.  He said he has had 2 falls like this over the past 6 months or so, one occurring when he was in [place] several months ago.  His daughter-in-law states he has had lots of issues with dehydration, and is likely not drinking enough water per day.  He was encouraged by the NP at the heart failure clinic to drink more water (5 or 6 16 ounce bottles per day), but he usually only drinks a bottle one half end of half cup of ginger ale.  He notes his weight has been between 138 and 141 pounds usually, he does not take his blood pressure at home.  He is only taking torsemide 20 mg as needed for weight gain, but has not done so lately.  By my time of evaluation the patient received a total of 1500 mL of fluid, and has been on 100 mL continuous infusion for the past 6 hours while in the ER.  He currently says  he feels better "more like himself."  He denies further dizziness, lightheadedness, no chest pain, lower extremity swelling, orthopnea, PND.  He has chronic exertional dyspnea that is no worse than usual.  Sleeps flat in a recliner with 1 pillow.  Previously smoked 1/4 pack a day for a number of years, but quit 25 years ago.  He does not drink alcohol or use illicit substances.  Lives alone, daughter-in-law and son see him daily.  Ambulates with a walker.  Allergies: Had significant transaminitis with spironolactone (taken for 1 month, AST/ALT 645/1200)  Vitals are notable for blood pressure of 127/76 heart rate 71 in sinus rhythm on telemetry. 68.5 kg (144.7 pounds).  Labs are notable for slight hyponatremia sodium 134, potassium 3.9, BUN/creatinine 17/1.58 GFR 44 (improving from admission at 17/1.83, 37) magnesium 2.4.  AST ALT 29/27 high-sensitivity troponin negative at 10-12.  BNP 355, lowest has been in the past 6 months.  No leukocytosis, H&H stable at 9/30 platelets 168.  CT head and C-spine without intracranial abnormality or fracture or subluxation.  Chest x-ray without active cardiopulmonary disease on the right shoulder without evidence of fracture or dislocation.   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 pylori infection    History of hepatitis    Hyperlipidemia    Hypertension    Liver cyst 05/16/15   PAF (paroxysmal atrial fibrillation) ([place])    Prostate CA [facility])     Past Surgical History:  Procedure Laterality Date   CATARACT EXTRACTION     COLONOSCOPY   10/06/2014, 09/18/2004, 03/14/2009   ESOPHAGOGASTRODUODENOSCOPY  10/06/2014, 03/18/2001, 03/14/2009   ESOPHAGOGASTRODUODENOSCOPY (EGD) WITH PROPOFOL N/A 05/07/2018   Procedure: ESOPHAGOGASTRODUODENOSCOPY (EGD) WITH PROPOFOL;  Surgeon: Toledo, [name], MD;  Location: [facility] ENDOSCOPY;  Service: Gastroenterology;  Laterality: N/A;   FLEXIBLE SIGMOIDOSCOPY  08/26/1990   INSERTION OF ICD     PROSTATE SURGERY     TONSILLECTOMY      (Not in a hospital admission)  Social History   Socioeconomic History   Marital status: Single    Spouse name: Not on file   Number of children: Not on file   Years of education: Not on file   Highest education level: Not on file  Occupational History   Not on file  Tobacco Use   Smoking status: Former    Packs/day: 1.00    Years: 38.00    Total pack years: 38.00    Types: Cigarettes    Quit date: 06/18/1999    Years since quitting: 22.6   Smokeless tobacco: Never  Vaping Use   Vaping Use: Never used  Substance and Sexual Activity   Alcohol use: No    Alcohol/week: 0.0 standard drinks of alcohol   Drug use: Not Currently   Sexual activity: Not Currently  Other Topics Concern   Not on file  Social History Narrative   Not on file   Social Determinants of Health   Financial Resource Strain: Not on file  Food Insecurity: Not on file  Transportation Needs: Not on file  Physical Activity: Not on file  Stress: Not on file  Social Connections: Not on file  Intimate Partner Violence: Not on file    Family History  Problem Relation Age of Onset   Heart attack Mother    Heart attack Father       PHYSICAL EXAM General: Pleasant conversational elderly Caucasian male, well nourished, in no acute distress.  Sitting upright in ED stretcher, daughter-in-law present by phone. HEENT:  Normocephalic and atraumatic. Neck:  No JVD.  Lungs: Normal respiratory effort on room air.  Decreased breath sounds bilaterally without appreciable crackles or wheezes.    Heart: HRRR . Normal S1 and S2 without gallops or murmurs.  Abdomen: Non-distended appearing.  Msk: Normal strength and tone for age. Extremities: Warm and well perfused. No clubbing, cyanosis.  No peripheral edema.  Neuro: Alert and oriented X 3. Psych:  Answers questions appropriately.   Labs: Basic Metabolic Panel: Recent Labs    02/11/22 1558 02/11/22 2054 02/12/22 0340  NA 134*  --  138  K 4.4  --  3.9  CL 103  --  110  CO2 26  --  24  GLUCOSE 100*  --  110*  BUN 17  --  17  CREATININE 1.83*  --  1.58*  CALCIUM 9.2  --  8.6*  MG  --  2.4  --    Liver Function Tests: Recent Labs  02/11/22 1558  AST 29  ALT 27  ALKPHOS 65  BILITOT 0.8  PROT 6.3*  ALBUMIN 3.7   No results for input(s): "LIPASE", "AMYLASE" in the last 72 hours. CBC: Recent Labs    02/11/22 1558 02/12/22 0340  WBC 5.5 5.0  NEUTROABS 4.2  --   HGB 10.3* 9.4*  HCT 32.7* 30.6*  MCV 94.0 96.2  PLT 178 168   Cardiac Enzymes: Recent Labs    02/11/22 2054 02/11/22 2304  TROPONINIHS 10 12   BNP: Invalid input(s): "POCBNP" D-Dimer: No results for input(s): "DDIMER" in the last 72 hours. Hemoglobin A1C: No results for input(s): "HGBA1C" in the last 72 hours. Fasting Lipid Panel: No results for input(s): "CHOL", "HDL", "LDLCALC", "TRIG", "CHOLHDL", "LDLDIRECT" in the last 72 hours. Thyroid Function Tests: No results for input(s): "TSH", "T4TOTAL", "T3FREE", "THYROIDAB" in the last 72 hours.  Invalid input(s): "FREET3" Anemia Panel: No results for input(s): "VITAMINB12", "FOLATE", "FERRITIN", "TIBC", "IRON", "RETICCTPCT" in the last 72 hours.  CT Head Wo Contrast  Result Date: 02/11/2022 CLINICAL DATA:  Head and neck trauma. Multiple recent falls. No LOC. Dizziness. EXAM: CT HEAD WITHOUT CONTRAST CT CERVICAL SPINE WITHOUT CONTRAST TECHNIQUE: Multidetector CT imaging of the head and cervical spine was performed following the standard protocol without intravenous contrast. Multiplanar CT  image reconstructions of the cervical spine were also generated. RADIATION DOSE REDUCTION: This exam was performed according to the departmental dose-optimization program which includes automated exposure control, adjustment of the mA and/or kV according to patient size and/or use of iterative reconstruction technique. COMPARISON:  CT examination dated January 29, 2022 FINDINGS: CT HEAD FINDINGS Brain: No evidence of acute infarction, hemorrhage, hydrocephalus, extra-axial collection or mass lesion/mass effect. Patchy area of low-attenuation in the periventricular white matter presumed chronic microvascular ischemic changes. Vascular: No hyperdense vessel or unexpected calcification. Skull: Normal. Negative for fracture or focal lesion. Sinuses/Orbits: No acute finding. Other: None. CT CERVICAL SPINE FINDINGS Alignment: Normal. Skull base and vertebrae: No acute fracture. No primary bone lesion or focal pathologic process. Soft tissues and spinal canal: No prevertebral fluid or swelling. No visible canal hematoma. 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 Mild multilevel facet joint arthropathy. C2-C3: Mild left and moderate right facet joint arthropathy. No significant spinal canal or neural foraminal stenosis. C3-C4: Moderate facet joint arthropathy with mild left neural foraminal stenosis. C4-C5: Moderate facet joint arthropathy left worse than the right. No significant spinal canal or neural foraminal stenosis. C5-C6: Disc osteophyte complex with mild spinal canal stenosis. Mild bilateral facet joint arthropathy. C6-C7: Disc osteophyte complex with mild spinal canal stenosis. Mild bilateral neural foraminal stenosis. Mild facet joint arthropathy. C7-T1:  No significant finding. Upper chest: Negative. Other: IMPRESSION: CT head: 1.  No acute intracranial abnormality. 2. Mild chronic microvascular ischemic changes of the white matter, unchanged. CT cervical  spine: 1. No acute fracture or traumatic subluxation. 2. Paraspinal soft tissues are unremarkable. 3. 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 Electronically Signed   By: Keane Police D.O.   On: 02/11/2022 21:51   CT Cervical Spine Wo Contrast  Result Date: 02/11/2022 CLINICAL DATA:  Head and neck trauma. Multiple recent falls. No LOC. Dizziness. EXAM: CT HEAD WITHOUT CONTRAST CT CERVICAL SPINE WITHOUT CONTRAST TECHNIQUE: Multidetector CT imaging of the head and cervical spine was performed following the standard protocol without intravenous contrast. Multiplanar CT image reconstructions of the cervical spine were also generated. RADIATION  DOSE REDUCTION: This exam was performed according to the departmental dose-optimization program which includes automated exposure control, adjustment of the mA and/or kV according to patient size and/or use of iterative reconstruction technique. COMPARISON:  CT examination dated January 29, 2022 FINDINGS: CT HEAD FINDINGS Brain: No evidence of acute infarction, hemorrhage, hydrocephalus, extra-axial collection or mass lesion/mass effect. Patchy area of low-attenuation in the periventricular white matter presumed chronic microvascular ischemic changes. Vascular: No hyperdense vessel or unexpected calcification. Skull: Normal. Negative for fracture or focal lesion. Sinuses/Orbits: No acute finding. Other: None. CT CERVICAL SPINE FINDINGS Alignment: Normal. Skull base and vertebrae: No acute fracture. No primary bone lesion or focal pathologic process. Soft tissues and spinal canal: No prevertebral fluid or swelling. No visible canal hematoma. 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 Mild multilevel facet joint arthropathy. C2-C3: Mild left and moderate right facet joint arthropathy. No significant spinal canal or neural foraminal stenosis. C3-C4:  Moderate facet joint arthropathy with mild left neural foraminal stenosis. C4-C5: Moderate facet joint arthropathy left worse than the right. No significant spinal canal or neural foraminal stenosis. C5-C6: Disc osteophyte complex with mild spinal canal stenosis. Mild bilateral facet joint arthropathy. C6-C7: Disc osteophyte complex with mild spinal canal stenosis. Mild bilateral neural foraminal stenosis. Mild facet joint arthropathy. C7-T1:  No significant finding. Upper chest: Negative. Other: IMPRESSION: CT head: 1.  No acute intracranial abnormality. 2. Mild chronic microvascular ischemic changes of the white matter, unchanged. CT cervical spine: 1. No acute fracture or traumatic subluxation. 2. Paraspinal soft tissues are unremarkable. 3. 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 Electronically Signed   By: Keane Police D.O.   On: 02/11/2022 21:51   DG Shoulder Right  Result Date: 02/11/2022 CLINICAL DATA:  Weakness and dizziness. Right shoulder pain after falling. EXAM: RIGHT SHOULDER - 2+ VIEW COMPARISON:  None Available. FINDINGS: The bones appear mildly demineralized. No evidence of acute fracture or dislocation. There are mild glenohumeral and acromioclavicular degenerative changes. The subacromial space appears adequately preserved. Left subclavian AICD leads are partially imaged. IMPRESSION: No evidence of acute fracture or dislocation. Electronically Signed   By: Richardean Sale M.D.   On: 02/11/2022 16:28   DG Chest 2 View  Result Date: 02/11/2022 CLINICAL DATA:  Weakness. EXAM: CHEST - 2 VIEW COMPARISON:  Chest x-ray 01/29/2022 FINDINGS: Cardiomediastinal silhouette is within normal limits. Left-sided pacemaker is present. The lungs are clear. There is no pleural effusion or pneumothorax. No acute fractures are identified. IMPRESSION: No active cardiopulmonary disease. Electronically Signed   By: Ronney Asters M.D.    On: 02/11/2022 16:26     Radiology: CT Head Wo Contrast  Result Date: 02/11/2022 CLINICAL DATA:  Head and neck trauma. Multiple recent falls. No LOC. Dizziness. EXAM: CT HEAD WITHOUT CONTRAST CT CERVICAL SPINE WITHOUT CONTRAST TECHNIQUE: Multidetector CT imaging of the head and cervical spine was performed following the standard protocol without intravenous contrast. Multiplanar CT image reconstructions of the cervical spine were also generated. RADIATION DOSE REDUCTION: This exam was performed according to the departmental dose-optimization program which includes automated exposure control, adjustment of the mA and/or kV according to patient size and/or use of iterative reconstruction technique. COMPARISON:  CT examination dated January 29, 2022 FINDINGS: CT HEAD FINDINGS Brain: No evidence of acute infarction, hemorrhage, hydrocephalus, extra-axial collection or mass lesion/mass effect. Patchy area of low-attenuation in the periventricular white matter presumed chronic microvascular ischemic changes. Vascular: No  hyperdense vessel or unexpected calcification. Skull: Normal. Negative for fracture or focal lesion. Sinuses/Orbits: No acute finding. Other: None. CT CERVICAL SPINE FINDINGS Alignment: Normal. Skull base and vertebrae: No acute fracture. No primary bone lesion or focal pathologic process. Soft tissues and spinal canal: No prevertebral fluid or swelling. No visible canal hematoma. 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 Mild multilevel facet joint arthropathy. C2-C3: Mild left and moderate right facet joint arthropathy. No significant spinal canal or neural foraminal stenosis. C3-C4: Moderate facet joint arthropathy with mild left neural foraminal stenosis. C4-C5: Moderate facet joint arthropathy left worse than the right. No significant spinal canal or neural foraminal stenosis. C5-C6: Disc osteophyte complex with mild spinal canal stenosis. Mild  bilateral facet joint arthropathy. C6-C7: Disc osteophyte complex with mild spinal canal stenosis. Mild bilateral neural foraminal stenosis. Mild facet joint arthropathy. C7-T1:  No significant finding. Upper chest: Negative. Other: IMPRESSION: CT head: 1.  No acute intracranial abnormality. 2. Mild chronic microvascular ischemic changes of the white matter, unchanged. CT cervical spine: 1. No acute fracture or traumatic subluxation. 2. Paraspinal soft tissues are unremarkable. 3. 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 Electronically Signed   By: Keane Police D.O.   On: 02/11/2022 21:51   CT Cervical Spine Wo Contrast  Result Date: 02/11/2022 CLINICAL DATA:  Head and neck trauma. Multiple recent falls. No LOC. Dizziness. EXAM: CT HEAD WITHOUT CONTRAST CT CERVICAL SPINE WITHOUT CONTRAST TECHNIQUE: Multidetector CT imaging of the head and cervical spine was performed following the standard protocol without intravenous contrast. Multiplanar CT image reconstructions of the cervical spine were also generated. RADIATION DOSE REDUCTION: This exam was performed according to the departmental dose-optimization program which includes automated exposure control, adjustment of the mA and/or kV according to patient size and/or use of iterative reconstruction technique. COMPARISON:  CT examination dated January 29, 2022 FINDINGS: CT HEAD FINDINGS Brain: No evidence of acute infarction, hemorrhage, hydrocephalus, extra-axial collection or mass lesion/mass effect. Patchy area of low-attenuation in the periventricular white matter presumed chronic microvascular ischemic changes. Vascular: No hyperdense vessel or unexpected calcification. Skull: Normal. Negative for fracture or focal lesion. Sinuses/Orbits: No acute finding. Other: None. CT CERVICAL SPINE FINDINGS Alignment: Normal. Skull base and vertebrae: No acute fracture. No primary bone lesion or  focal pathologic process. Soft tissues and spinal canal: No prevertebral fluid or swelling. No visible canal hematoma. 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 Mild multilevel facet joint arthropathy. C2-C3: Mild left and moderate right facet joint arthropathy. No significant spinal canal or neural foraminal stenosis. C3-C4: Moderate facet joint arthropathy with mild left neural foraminal stenosis. C4-C5: Moderate facet joint arthropathy left worse than the right. No significant spinal canal or neural foraminal stenosis. C5-C6: Disc osteophyte complex with mild spinal canal stenosis. Mild bilateral facet joint arthropathy. C6-C7: Disc osteophyte complex with mild spinal canal stenosis. Mild bilateral neural foraminal stenosis. Mild facet joint arthropathy. C7-T1:  No significant finding. Upper chest: Negative. Other: IMPRESSION: CT head: 1.  No acute intracranial abnormality. 2. Mild chronic microvascular ischemic changes of the white matter, unchanged. CT cervical spine: 1. No acute fracture or traumatic subluxation. 2. Paraspinal soft tissues are unremarkable. 3. 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 Electronically Signed   By: Keane Police D.O.   On: 02/11/2022 21:51   DG Shoulder Right  Result Date:  02/11/2022 CLINICAL DATA:  Weakness and dizziness. Right shoulder pain after falling. EXAM: RIGHT SHOULDER - 2+ VIEW COMPARISON:  None Available. FINDINGS: The bones appear mildly demineralized. No evidence of acute fracture or dislocation. There are mild glenohumeral and acromioclavicular degenerative changes. The subacromial space appears adequately preserved. Left subclavian AICD leads are partially imaged. IMPRESSION: No evidence of acute fracture or dislocation. Electronically Signed   By: Richardean Sale M.D.   On: 02/11/2022 16:28   DG Chest 2 View  Result Date:  02/11/2022 CLINICAL DATA:  Weakness. EXAM: CHEST - 2 VIEW COMPARISON:  Chest x-ray 01/29/2022 FINDINGS: Cardiomediastinal silhouette is within normal limits. Left-sided pacemaker is present. The lungs are clear. There is no pleural effusion or pneumothorax. No acute fractures are identified. IMPRESSION: No active cardiopulmonary disease. Electronically Signed   By: Ronney Asters M.D.   On: 02/11/2022 16:26   DG Chest Portable 1 View  Result Date: 01/29/2022 CLINICAL DATA:  rib pain EXAM: PORTABLE CHEST 1 VIEW COMPARISON:  Chest x-ray 01/03/2022, CT chest 06/13/2021 FINDINGS: Left chest wall dual lead pacemaker and defibrillator in stable position. The heart and mediastinal contours are unchanged. Aortic calcification. Coronary artery stents. No focal consolidation. No pulmonary edema. No pleural effusion. No pneumothorax. No acute osseous abnormality. IMPRESSION: 1. No acute cardiopulmonary disease. 2. Please note limited evaluation of the ribs on this chest x-ray. Consider dedicated rib series. Please note, nondisplaced rib fractures may be occult on radiograph. 3.  Aortic Atherosclerosis (ICD10-I70.0). Electronically Signed   By: Iven Finn M.D.   On: 01/29/2022 23:50   CT Head Wo Contrast  Result Date: 01/29/2022 CLINICAL DATA:  Fall.  Dizziness EXAM: CT HEAD WITHOUT CONTRAST CT CERVICAL SPINE WITHOUT CONTRAST TECHNIQUE: Multidetector CT imaging of the head and cervical spine was performed following the standard protocol without intravenous contrast. Multiplanar CT image reconstructions of the cervical spine were also generated. RADIATION DOSE REDUCTION: This exam was performed according to the departmental dose-optimization program which includes automated exposure control, adjustment of the mA and/or kV according to patient size and/or use of iterative reconstruction technique. COMPARISON:  None Available. FINDINGS: CT HEAD FINDINGS BRAIN: BRAIN Patchy and confluent areas of decreased attenuation  are noted throughout the deep and periventricular white matter of the cerebral hemispheres bilaterally, compatible with chronic microvascular ischemic disease. No evidence of large-territorial acute infarction. No parenchymal hemorrhage. No mass lesion. No extra-axial collection. No mass effect or midline shift. No hydrocephalus. Basilar cisterns are patent. Vascular: No hyperdense vessel. Atherosclerotic calcifications are present within the cavernous internal carotid arteries. Skull: No acute fracture or focal lesion. Sinuses/Orbits: Paranasal sinuses and mastoid air cells are clear. Bilateral lens replacement. Otherwise the orbits are unremarkable. Other: None. CT CERVICAL SPINE FINDINGS Alignment: Normal. Skull base and vertebrae: Multilevel at least moderate degenerative changes spine. No acute fracture. No aggressive appearing focal osseous lesion or focal pathologic process. Soft tissues and spinal canal: No prevertebral fluid or swelling. No visible canal hematoma. Upper chest: Biapical pleural/pulmonary scarring. Paraseptal emphysematous changes. Other: Atherosclerotic plaque within the carotid arteries within the neck. IMPRESSION: 1. No acute intracranial abnormality. 2. No acute displaced fracture or traumatic listhesis of the cervical spine. 3.  Emphysema (ICD10-J43.9). Electronically Signed   By: Iven Finn M.D.   On: 01/29/2022 20:34   CT Cervical Spine Wo Contrast  Result Date: 01/29/2022 CLINICAL DATA:  Fall.  Dizziness EXAM: CT HEAD WITHOUT CONTRAST CT CERVICAL SPINE WITHOUT CONTRAST TECHNIQUE: Multidetector CT imaging of the head and cervical spine was performed  following the standard protocol without intravenous contrast. Multiplanar CT image reconstructions of the cervical spine were also generated. RADIATION DOSE REDUCTION: This exam was performed according to the departmental dose-optimization program which includes automated exposure control, adjustment of the mA and/or kV according  to patient size and/or use of iterative reconstruction technique. COMPARISON:  None Available. FINDINGS: CT HEAD FINDINGS BRAIN: BRAIN Patchy and confluent areas of decreased attenuation are noted throughout the deep and periventricular white matter of the cerebral hemispheres bilaterally, compatible with chronic microvascular ischemic disease. No evidence of large-territorial acute infarction. No parenchymal hemorrhage. No mass lesion. No extra-axial collection. No mass effect or midline shift. No hydrocephalus. Basilar cisterns are patent. Vascular: No hyperdense vessel. Atherosclerotic calcifications are present within the cavernous internal carotid arteries. Skull: No acute fracture or focal lesion. Sinuses/Orbits: Paranasal sinuses and mastoid air cells are clear. Bilateral lens replacement. Otherwise the orbits are unremarkable. Other: None. CT CERVICAL SPINE FINDINGS Alignment: Normal. Skull base and vertebrae: Multilevel at least moderate degenerative changes spine. No acute fracture. No aggressive appearing focal osseous lesion or focal pathologic process. Soft tissues and spinal canal: No prevertebral fluid or swelling. No visible canal hematoma. Upper chest: Biapical pleural/pulmonary scarring. Paraseptal emphysematous changes. Other: Atherosclerotic plaque within the carotid arteries within the neck. IMPRESSION: 1. No acute intracranial abnormality. 2. No acute displaced fracture or traumatic listhesis of the cervical spine. 3.  Emphysema (ICD10-J43.9). Electronically Signed   By: Iven Finn M.D.   On: 01/29/2022 20:34    ECHO 12/07/2021 Staff  Referring Physician:     [id number], Cory Roughen;  Reading Fellow:     Gregary Cromer MD  Sonographer:     Asencion Islam  Ordering Physician:     Denton Ar   Account #:     [id number]   Ultrasound Enhancing Agent/Agitated Saline   ------------------------------  UEA/Ag. Saline:     Optison  Amount:     --- ml    Summary    1. The left  ventricle is upper normal in size with normal wall thickness.    2. The left ventricular systolic function is moderately decreased, LVEF is  visually estimated at 40%.    3. The right ventricle is normal in size, with low normal systolic function.    Left Ventricle    The left ventricle is upper normal in size with normal wall thickness.    The left ventricular systolic function is moderately decreased, LVEF is  visually estimated at 40%.    Left ventricular diastolic function cannot be accurately assessed.    No left ventricular thrombus visualized.   Right Ventricle    The right ventricle is normal in size, with low normal systolic function.    Additional right ventricle findings: there is a device lead present.    Left Atrium    The left atrium is normal in size.   Right Atrium    The right atrium is normal  in size.    There is a device lead present.    Aortic Valve    The aortic valve is poorly visualized with probably normal excursion.    There is no significant aortic regurgitation.    There is no evidence of a significant transvalvular gradient.   Pulmonic Valve    The pulmonic valve is poorly visualized, but probably normal.    There is no significant pulmonic regurgitation.    There is no evidence of a significant transvalvular gradient.   Mitral Valve    The  mitral valve leaflets are normal with normal leaflet mobility.    There is no significant mitral valve regurgitation.   Tricuspid Valve    The tricuspid valve leaflets are normal, with normal leaflet mobility.    There is mild tricuspid regurgitation.    There is no pulmonary hypertension.    TR maximum velocity: 2.7 m/s Estimated PASP: 32 mmHg.    Other Findings    Rhythm: Atrial Fibrillation.   Pericardium/Pleural    There is no pericardial effusion.   Inferior Vena Cava    IVC size and inspiratory change suggest normal right atrial pressure. (0-5  mmHg).   Aorta    The aorta is normal in  size in the visualized segments.    Pulmonic Valve  ----------------------------------------------------------------------  Name                                 Value        Normal  ----------------------------------------------------------------------   PV Doppler  ----------------------------------------------------------------------  PV Peak Velocity                   1.0 m/s   Tricuspid Valve  ----------------------------------------------------------------------  Name                                 Value        Normal  ----------------------------------------------------------------------   TV Regurgitation Doppler  ----------------------------------------------------------------------  TR Peak Velocity                   2.7 m/s                 Estimated PAP/RSVP  ----------------------------------------------------------------------  RA Pressure                         3 mmHg           <=5  RV Systolic Pressure               32 mmHg           <36   Aorta  ----------------------------------------------------------------------  Name                                 Value        Normal  ----------------------------------------------------------------------   Ascending Aorta  ----------------------------------------------------------------------  Ao Root Diameter (2D)               2.3 cm                Ao Root Diam Index (2D)         13.0 cm/m2   Venous  ----------------------------------------------------------------------  Name                                 Value        Normal  ----------------------------------------------------------------------   IVC/SVC  ----------------------------------------------------------------------  IVC Diameter (Exp 2D)               1.5 cm         <=2.1   Ventricles  ----------------------------------------------------------------------  Name  Value        Normal   ----------------------------------------------------------------------   LV Dimensions 2D/MM  ----------------------------------------------------------------------  IVS Diastolic Thickness (2D)        1.0 cm       0.6-1.0  LVID Diastole (2D)                  5.7 cm       0.8-6.5  LVPW Diastolic Thickness  (2D)                                0.8 cm       0.6-1.0  LVID Systole (2D)                   4.7 cm       2.5-4.0   Atria  ----------------------------------------------------------------------  Name                                 Value        Normal  ----------------------------------------------------------------------   LA Dimensions  ----------------------------------------------------------------------  LA Dimension (2D)                   3.8 cm       3.0-4.1    Report Signatures  Finalized by Sandford Craze  MD on 12/07/2021 05:59 PM  Resident Gregary Cromer  MD on 12/07/2021 03:16 PM  TELEMETRY reviewed by me and Dr. 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  Cardiology is consulted for further assistance.  #Generalized weakness #Presyncope  ?Orthostasis vs dehydration #VT s/p Medtronic ICD 08/2017 #Paroxysmal  atrial fibrillation on Eliquis The patient presents after a fall, shortly after getting up from a seated position, preceded by dizziness and lightheadedness.  Received total of 1500 mL bolus, and has been on a 100 mL/h continuous infusion of normal saline with clinical improvement.  Telemetry shows mostly the patient in sinus rhythm without periods of significant bradycardia or tachycardia, there was a ~1 minute period of a wide-complex rhythm with rate in the 70s that was of concern -likely a ventricular paced rhythm as the rate does not change, and notably there have not been any treated episodes of VT or VF, or AT or AF detected since his last device interrogation.   0.6% V pacing.  Remaining device longevity of 16 months.  Strongly suspect this episode was orthostatic in nature in the setting of dehydration and poor fluid intake.  He appears clinically on the drier side, labs confirm this.  BNP is actually the lowest it has been in 6 months. -S/p 1500 mL NS, currently on 100 mL/h  infusion (since 0300). -Can stop IV normal saline now -Encourage p.o. fluid intake to 5 or 6 16 ounce bottles per day, continue torsemide 20 mg as needed for weight gain -Continue home amiodarone 400 mg once daily, mexiletine 150 mg 3 times daily, metoprolol 75 mg twice daily -Continue Eliquis 2.5 mg twice daily for stroke risk reduction.  CHA2DS2-VASc 5 (age, chf, htn, 68 dz) -Encourage patient to take his blood pressure at home daily -Defer repeat echo, recent study 2 months ago -If he is ambulating well with a walker without difficulty or recurrent presyncope, he is okay for discharge today from a cardiac standpoint.  Recommend close follow-up with his regular cardiologist at [facility] in 1 to 2 weeks.  #ICM (LVEF 40% 11/2021) Appears clinically dehydrated, BNP 355, renal function improving with hydration. -Continue medications as above  #CAD s/p PCI x3 RCA 2019 Chest pain-free.  EKG nonacute. -Continue aspirin 81 mg  daily, atorvastatin 80 mg daily  This patient's plan of care was discussed and created with Dr. Clayborn Bigness and he is in agreement.  Signed: Tristan Schroeder , PA-C 02/12/2022, 7:46 AM [facility]

## 2022-02-12 NOTE — Progress Notes (Signed)
Discharge order canceled, hopefully SNF will have bed tomorrow

## 2022-02-12 NOTE — TOC Initial Note (Signed)
Transition of Care [facility]) - Initial/Assessment Note    Patient Details  Name: Matthew Zamora MRN: [id number] Date of Birth: [birth year]  Transition of Care [facility]) CM/SW Contact:    Shelbie Hutching, RN Phone Number: 02/12/2022, 12:26 PM  Clinical Narrative:                 Patient placed under observation for non sustained ventricular tachycardia.  RNCM met with patient at the bedside.  Patient is from home where he lives alone, he walks with a walker.  PT is currently recommending SNF and patient agrees to go to rehab.  Patient has been to rehab in the past and would like to go to [facility].  RNCM will being SNF workup.   Expected Discharge Plan: Skilled Nursing Facility Barriers to Discharge: SNF Pending bed offer   Patient Goals and CMS Choice Patient states their goals for this hospitalization and ongoing recovery are:: Patient agrees to go to rehab, and he would like something to help him have a bowel movement CMS Medicare.gov Compare Post Acute Care list provided to:: Patient Choice offered to / list presented to : Patient  Expected Discharge Plan and Services Expected Discharge Plan: [place]   Discharge Planning Services: CM Consult Post Acute Care Choice: [place] Living arrangements for the past 2 months: Single Family Home Expected Discharge Date: 02/12/22               DME Arranged: N/A DME Agency: NA       HH Arranged: NA [place] Agency: NA        Prior Living Arrangements/Services Living arrangements for the past 2 months: Single Family Home Lives with:: Self Patient language and need for interpreter reviewed:: Yes Do you feel safe going back to the place where you live?: Yes      Need for Family Participation in Patient Care: Yes (Comment) Care giver support system in place?: Yes (comment) Current home services: DME (rollator, bedside commode, cane, shower chair) Criminal Activity/Legal Involvement Pertinent to Current  Situation/Hospitalization: No - Comment as needed  Activities of Daily Living Home Assistive Devices/Equipment: Cane (specify quad or straight), Walker (specify type) ADL Screening (condition at time of admission) Patient's cognitive ability adequate to safely complete daily activities?: Yes Is the patient deaf or have difficulty hearing?: No Does the patient have difficulty seeing, even when wearing glasses/contacts?: No Does the patient have difficulty concentrating, remembering, or making decisions?: No Patient able to express need for assistance with ADLs?: Yes Does the patient have difficulty dressing or bathing?: No Independently performs ADLs?: Yes (appropriate for developmental age) Does the patient have difficulty walking or climbing stairs?: No Weakness of Legs: None Weakness of Arms/Hands: None  Permission Sought/Granted Permission sought to share information with : Case Manager, Customer service manager Permission granted to share information with : Yes, Verbal Permission Granted     Permission granted to share info w AGENCY: [facility]        Emotional Assessment Appearance:: Appears stated age Attitude/Demeanor/Rapport: Engaged Affect (typically observed): Accepting Orientation: : Oriented to Self, Oriented to Place, Oriented to  Time, Oriented to Situation Alcohol / Substance Use: Not Applicable Psych Involvement: No (comment)  Admission diagnosis:  NSVT (nonsustained ventricular tachycardia) ([name]) [I47.29] Patient Active Problem List   Diagnosis Date Noted   GERD without esophagitis 02/12/2022   Recurrent falls    Generalized weakness    NSVT (nonsustained ventricular tachycardia) ([place]) 02/11/2022   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) 08/10/2021   Acute respiratory failure with hypoxia ([name])    HCAP (healthcare-associated pneumonia) 06/13/2021   Acute on chronic systolic CHF (congestive  heart failure) ([place]) 06/13/2021   Elevated troponin 06/13/2021   CKD (chronic kidney disease), 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   Lung nodule 06/13/2021   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   COPD exacerbation ([place]) 06/13/2021   Volume depletion 12/15/2019   Hypotension 12/15/2019   Cardiorenal syndrome    Atrial fibrillation with RVR ([name])    Defibrillator discharge    Tachycardia 10/25/2019   CHF (congestive heart failure) ([place]) 09/18/2019   Acute on chronic combined systolic and diastolic CHF (congestive heart failure) ([place]) 09/17/2019   CAD (coronary artery disease) 09/17/2019   Ischemic cardiomyopathy 09/17/2019   CKD (chronic kidney disease) stage 3, GFR 30-59 ml/min ([place]) 09/17/2019   Acute hypoxemic respiratory failure ([place]) 09/17/2018   AAA (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 ([place]) 07/06/2018   Essential hypertension 07/06/2018   Hyperlipidemia 07/06/2018   Paroxysmal atrial fibrillation ([place])    Acute on chronic respiratory failure with hypoxia ([place]) 04/26/2018   PCP:  [place] Pink, MD Pharmacy:   [facility] [address] (N), [place] [address] [place]) [place] [address] Phone: [phone] Fax: [phone]     Social Determinants of Health (SDOH) Interventions    Readmission Risk Interventions     No data to display

## 2022-02-12 NOTE — ED Notes (Addendum)
Pt sleeping at this time. Pts temp has been normal since arrival. Will recheck when awake.

## 2022-02-12 NOTE — ED Notes (Signed)
Patient very pleasant and cooperative with care. Old bloody drainage noted to PIV. Old dressing removed, site cleansed with chlorhexidine and new Tegaderm applied. IV flushes without signs or symptoms of infiltration. Mango popsicle given to patient per request. No distress noted at this time.

## 2022-02-12 NOTE — Discharge Summary (Signed)
Physician Discharge Summary   Patient: Matthew Zamora MRN: [id number]  DOB: [birth year]   Admit:     Date of Admission: 02/11/2022 Admitted from: home   Discharge: Date of discharge: 02/12/22 Disposition: Skilled nursing facility Condition at discharge: good  CODE STATUS: FULL     Discharge Physician: Matthew Reeve, DO [facility]     PCP: Matthew Pink, MD  Recommendations for Outpatient Follow-up:  Follow up with PCP Matthew Pink, MD in 1-2 weeks Please obtain labs/tests: CBC, BMP  Please follow up on the following pending results: none Please ensure outpatient cardiology follow-up PCP AND OTHER OUTPATIENT PROVIDERS: SEE BELOW FOR SPECIFIC DISCHARGE INSTRUCTIONS PRINTED FOR PATIENT IN ADDITION TO GENERIC AVS PATIENT INFO    Discharge Instructions     Diet - low sodium heart healthy   Complete by: As directed    Increase activity slowly   Complete by: As directed          Discharge Diagnoses: Principal Problem:   NSVT (nonsustained ventricular tachycardia) ([name]) Active Problems:   Paroxysmal atrial fibrillation ([name])   Hyperlipidemia   CKD (chronic kidney disease) stage 3, GFR 30-59 ml/min ([name])   GERD without esophagitis    Hospital Course: Matthew Zamora is a 81 y.o. male with medical history significant for multiple medical problems that are mentioned below, who presented to the emergency room with acute onset of recurrent falls with generalized weakness, dizziness and lightheadedness as well as presyncope upon standing and was noted to have a 15 beat run of unsustained ventricular tachycardia in the emergency room.  He stated he has not been eating or drinking as much and feels he was dehydrated.  ED Course: In the ER, BP was 93/65 and got better with hydration with otherwise normal vital signs.  Labs revealed mild hyponatremia and a creatinine 1.83 better than previous levels. High sensitive troponin I was 10 and later 12.   Hemoglobin hematocrit were 10.3 and 32.7 close to baseline. EKG atrial paced rhythm with prolonged AV conduction with a rate of 61 Imaging: Two-view chest x-ray showed no acute cardiopulmonary disease and right shoulder x-ray showed no evidence for fracture or dislocation. Treatment: The patient was given half a liter bolus of IV normal saline.  His AICD was interrogated and showed no firing.  He will be admitted to a progressive unit observation bed for further evaluation and management. 08/29: Cardiology recommendations: No arrhythmia detected on device interrogation, telemetry strip of concern for VT was possible ventricular paced beats due to slow HR.  Discontinued spironolactone, do not restart, due to significant transaminitis.  Cleared for discharge from cardiology standpoint if able to ambulate/symptoms have resolved.  Patient reports improvement post fluids, reduced p.o. intake at home.   Consultants:  Cardiology  Procedures: none      ASSESSMENT & PLAN:   Principal Problem:   NSVT (nonsustained ventricular tachycardia) ([name]) Active Problems:   Paroxysmal atrial fibrillation ([name])   Hyperlipidemia   CKD (chronic kidney disease) stage 3, GFR 30-59 ml/min ([name])   GERD without esophagitis   NSVT (nonsustained ventricular tachycardia) ([place]) Likely a cause of recurrent falls and generalized weakness as well as presyncope. The patient is status post AICD with no discharge. No arrhythmia detected on device interrogation, telemetry strip of concern for VT was possible ventricular paced beats due to slow HR.   Discontinued spironolactone, do not restart, due to significant transaminitis.    Patient reports improvement post fluids, reduced p.o. intake at home.  Encourage p.o. fluid intake to 5 or 6 16 ounce bottles per day continue torsemide 20 mg as needed for weight gain / edema Cleared for discharge from cardiology standpoint if able to ambulate/symptoms have resolved.    Paroxysmal  atrial fibrillation ([name]) continue amiodarone and mexitil Continue Eliquis Toprol XL dose reduced d/t soft BP and concern for fall risk   Hyperlipidemia continue statin therapy.  GERD without esophagitis continue PPI therapy.  CKD (chronic kidney disease) stage 3, GFR 30-59 ml/min ([name]) Monitor BMP  Debility and falls PT recommending SNF Medication changes as above   DVT prophylaxis: eliquis Pertinent IV fluids/nutrition: IV fluids 1L given Central lines / invasive devices: none  Code Status: FULL Family Communication: none at this time  Disposition: SNF for rehab, ok for discharge per cardiology  TOC needs: SNF Barriers to discharge / significant pending items: SNF placement, watching BP for now as well             Discharge Instructions  Allergies as of 02/12/2022       Reactions   Nitrofuran Derivatives Other (See Comments)   Transaminitis ** confounded w/Amiodarone, Mexiletine   Spironolactone    Significant transaminitis         Medication List     TAKE these medications    acetaminophen 325 MG tablet Commonly known as: TYLENOL Take 2 tablets (650 mg total) by mouth every 4 (four) hours as needed for headache or mild pain.   albuterol (2.5 MG/3ML) 0.083% nebulizer solution Commonly known as: PROVENTIL Inhale 3 mLs (2.5 mg total) into the lungs as needed for shortness of breath.   albuterol 108 (90 Base) MCG/ACT inhaler Commonly known as: VENTOLIN HFA Inhale 2 puffs into the lungs every 4 (four) hours as needed.   amiodarone 400 MG tablet Commonly known as: PACERONE Take 1 tablet (400 mg total) by mouth daily.   ascorbic acid 500 MG tablet Commonly known as: VITAMIN C Take 1 tablet by mouth daily.   aspirin 81 MG chewable tablet Chew 81 mg by mouth daily at 12 noon.   atorvastatin 80 MG tablet Commonly known as: LIPITOR Take 80 mg by mouth daily.   benzonatate 100 MG capsule Commonly known as: Tessalon Perles Take 1 capsule  (100 mg total) by mouth every 6 (six) hours as needed for cough.   Eliquis 2.5 MG Tabs tablet Generic drug: apixaban Take 2.5 mg by mouth 2 (two) times daily.   gabapentin 100 MG capsule Commonly known as: NEURONTIN Take 100 mg by mouth at bedtime.   magnesium oxide 400 (240 Mg) MG tablet Commonly known as: MAG-OX Take 1 tablet by mouth daily.   melatonin 3 MG Tabs tablet Take 9 mg by mouth at bedtime.   metoprolol succinate 25 MG 24 hr tablet Commonly known as: TOPROL-XL Take 1 tablet (25 mg total) by mouth 2 (two) times daily. Take with or immediately following a meal. What changed:  medication strength how much to take additional instructions   mexiletine 150 MG capsule Commonly known as: MEXITIL Take 150 mg by mouth 2 (two) times daily.   multivitamin with minerals Tabs tablet Take 1 tablet by mouth daily.   Nebulizer Misc 1 each by Does not apply route as needed.   omeprazole 20 MG capsule Commonly known as: PRILOSEC Take 20 mg by mouth every morning.   oxymetazoline 0.05 % nasal spray Commonly known as: AFRIN Place 2 sprays into both nostrils 2 (two) times daily as needed for congestion (nose  bleeds).   tadalafil 20 MG tablet Commonly known as: CIALIS Take 20 mg by mouth daily.   tamsulosin 0.4 MG Caps capsule Commonly known as: FLOMAX Take 0.4 mg by mouth daily.   torsemide 20 MG tablet Commonly known as: DEMADEX Take 1 tablet (20 mg total) by mouth daily as needed. for up to 3 days for increased leg swelling, shortness of breath, weight gain 5+ lbs over 1-2 days. Seek medical care if these symptoms are not improving with increased dose. What changed:  when to take this reasons to take this additional instructions         Follow-up Information     Luana Shu, Greig Right, MD. Go in 1 week(s).   Specialty: Cardiology Contact information: [address] [facility] [place] [address] [phone]                  Allergies  Allergen Reactions   Nitrofuran Derivatives Other (See Comments)    Transaminitis ** confounded w/Amiodarone, Mexiletine   Spironolactone     Significant transaminitis      Subjective: Patient feeling well this morning, better after having gotten IV fluids.  Cardiology has already rounded and he has no additional questions   Discharge Exam: BP (!) 91/55   Pulse 62   Temp (!) 97.5 F (36.4 C) (Oral)   Resp 13   Ht '5\' 9"'$  (1.753 m)   Wt 65.8 kg   SpO2 99%   BMI 21.42 kg/m  General: Pt is alert, awake, not in acute distress Cardiovascular: RRR, S1/S2 +, no rubs, no gallops Respiratory: CTA bilaterally, no wheezing, no rhonchi Abdominal: Soft, NT, ND, bowel sounds + Extremities: no edema, no cyanosis     The results of significant diagnostics from this hospitalization (including imaging, microbiology, ancillary and laboratory) are listed below for reference.     Microbiology: No results found for this or any previous visit (from the past 240 hour(s)).   Labs: BNP (last 3 results) Recent Labs    08/10/21 0426 01/03/22 1348 02/12/22 0340  BNP 411.3* 470.2* 841.6*   Basic Metabolic Panel: Recent Labs  Lab 02/11/22 1558 02/11/22 2054 02/12/22 0340  NA 134*  --  138  K 4.4  --  3.9  CL 103  --  110  CO2 26  --  24  GLUCOSE 100*  --  110*  BUN 17  --  17  CREATININE 1.83*  --  1.58*  CALCIUM 9.2  --  8.6*  MG  --  2.4  --    Liver Function Tests: Recent Labs  Lab 02/11/22 1558  AST 29  ALT 27  ALKPHOS 65  BILITOT 0.8  PROT 6.3*  ALBUMIN 3.7   No results for input(s): "LIPASE", "AMYLASE" in the last 168 hours. No results for input(s): "AMMONIA" in the last 168 hours. CBC: Recent Labs  Lab 02/11/22 1558 02/12/22 0340  WBC 5.5 5.0  NEUTROABS 4.2  --   HGB 10.3* 9.4*  HCT 32.7* 30.6*  MCV 94.0 96.2  PLT 178 168   Cardiac Enzymes: No results for input(s): "CKTOTAL", "CKMB", "CKMBINDEX", "TROPONINI" in the last 168  hours. BNP: Invalid input(s): "POCBNP" CBG: No results for input(s): "GLUCAP" in the last 168 hours. D-Dimer No results for input(s): "DDIMER" in the last 72 hours. Hgb A1c No results for input(s): "HGBA1C" in the last 72 hours. Lipid Profile No results for input(s): "CHOL", "HDL", "LDLCALC", "TRIG", "CHOLHDL", "LDLDIRECT" in the last 72  hours. Thyroid function studies No results for input(s): "TSH", "T4TOTAL", "T3FREE", "THYROIDAB" in the last 72 hours.  Invalid input(s): "FREET3" Anemia work up No results for input(s): "VITAMINB12", "FOLATE", "FERRITIN", "TIBC", "IRON", "RETICCTPCT" in the last 72 hours. Urinalysis    Component Value Date/Time   COLORURINE YELLOW (A) 02/11/2022 2054   APPEARANCEUR CLEAR (A) 02/11/2022 2054   LABSPEC 1.013 02/11/2022 2054   PHURINE 7.0 02/11/2022 2054   GLUCOSEU NEGATIVE 02/11/2022 2054   HGBUR NEGATIVE 02/11/2022 2054   [place] NEGATIVE 02/11/2022 2054   [place] NEGATIVE 02/11/2022 2054   PROTEINUR NEGATIVE 02/11/2022 2054   NITRITE NEGATIVE 02/11/2022 2054   LEUKOCYTESUR TRACE (A) 02/11/2022 2054   Sepsis Labs Recent Labs  Lab 02/11/22 1558 02/12/22 0340  WBC 5.5 5.0   Microbiology No results found for this or any previous visit (from the past 240 hour(s)). Imaging CT Head Wo Contrast  Result Date: 02/11/2022 CLINICAL DATA:  Head and neck trauma. Multiple recent falls. No LOC. Dizziness. EXAM: CT HEAD WITHOUT CONTRAST CT CERVICAL SPINE WITHOUT CONTRAST TECHNIQUE: Multidetector CT imaging of the head and cervical spine was performed following the standard protocol without intravenous contrast. Multiplanar CT image reconstructions of the cervical spine were also generated. RADIATION DOSE REDUCTION: This exam was performed according to the departmental dose-optimization program which includes automated exposure control, adjustment of the mA and/or kV according to patient size and/or use of iterative reconstruction technique.  COMPARISON:  CT examination dated January 29, 2022 FINDINGS: CT HEAD FINDINGS Brain: No evidence of acute infarction, hemorrhage, hydrocephalus, extra-axial collection or mass lesion/mass effect. Patchy area of low-attenuation in the periventricular white matter presumed chronic microvascular ischemic changes. Vascular: No hyperdense vessel or unexpected calcification. Skull: Normal. Negative for fracture or focal lesion. Sinuses/Orbits: No acute finding. Other: None. CT CERVICAL SPINE FINDINGS Alignment: Normal. Skull base and vertebrae: No acute fracture. No primary bone lesion or focal pathologic process. Soft tissues and spinal canal: No prevertebral fluid or swelling. No visible canal hematoma. 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 Mild multilevel facet joint arthropathy. C2-C3: Mild left and moderate right facet joint arthropathy. No significant spinal canal or neural foraminal stenosis. C3-C4: Moderate facet joint arthropathy with mild left neural foraminal stenosis. C4-C5: Moderate facet joint arthropathy left worse than the right. No significant spinal canal or neural foraminal stenosis. C5-C6: Disc osteophyte complex with mild spinal canal stenosis. Mild bilateral facet joint arthropathy. C6-C7: Disc osteophyte complex with mild spinal canal stenosis. Mild bilateral neural foraminal stenosis. Mild facet joint arthropathy. C7-T1:  No significant finding. Upper chest: Negative. Other: IMPRESSION: CT head: 1.  No acute intracranial abnormality. 2. Mild chronic microvascular ischemic changes of the white matter, unchanged. CT cervical spine: 1. No acute fracture or traumatic subluxation. 2. Paraspinal soft tissues are unremarkable. 3. 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 Electronically Signed   By: Keane Police D.O.   On: 02/11/2022 21:51   CT Cervical Spine Wo  Contrast  Result Date: 02/11/2022 CLINICAL DATA:  Head and neck trauma. Multiple recent falls. No LOC. Dizziness. EXAM: CT HEAD WITHOUT CONTRAST CT CERVICAL SPINE WITHOUT CONTRAST TECHNIQUE: Multidetector CT imaging of the head and cervical spine was performed following the standard protocol without intravenous contrast. Multiplanar CT image reconstructions of the cervical spine were also generated. RADIATION DOSE REDUCTION: This exam was performed according to the departmental dose-optimization program which includes automated exposure control, adjustment of the mA and/or  kV according to patient size and/or use of iterative reconstruction technique. COMPARISON:  CT examination dated January 29, 2022 FINDINGS: CT HEAD FINDINGS Brain: No evidence of acute infarction, hemorrhage, hydrocephalus, extra-axial collection or mass lesion/mass effect. Patchy area of low-attenuation in the periventricular white matter presumed chronic microvascular ischemic changes. Vascular: No hyperdense vessel or unexpected calcification. Skull: Normal. Negative for fracture or focal lesion. Sinuses/Orbits: No acute finding. Other: None. CT CERVICAL SPINE FINDINGS Alignment: Normal. Skull base and vertebrae: No acute fracture. No primary bone lesion or focal pathologic process. Soft tissues and spinal canal: No prevertebral fluid or swelling. No visible canal hematoma. 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 Mild multilevel facet joint arthropathy. C2-C3: Mild left and moderate right facet joint arthropathy. No significant spinal canal or neural foraminal stenosis. C3-C4: Moderate facet joint arthropathy with mild left neural foraminal stenosis. C4-C5: Moderate facet joint arthropathy left worse than the right. No significant spinal canal or neural foraminal stenosis. C5-C6: Disc osteophyte complex with mild spinal canal stenosis. Mild bilateral facet joint arthropathy. C6-C7: Disc  osteophyte complex with mild spinal canal stenosis. Mild bilateral neural foraminal stenosis. Mild facet joint arthropathy. C7-T1:  No significant finding. Upper chest: Negative. Other: IMPRESSION: CT head: 1.  No acute intracranial abnormality. 2. Mild chronic microvascular ischemic changes of the white matter, unchanged. CT cervical spine: 1. No acute fracture or traumatic subluxation. 2. Paraspinal soft tissues are unremarkable. 3. 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 Electronically Signed   By: Keane Police D.O.   On: 02/11/2022 21:51   DG Shoulder Right  Result Date: 02/11/2022 CLINICAL DATA:  Weakness and dizziness. Right shoulder pain after falling. EXAM: RIGHT SHOULDER - 2+ VIEW COMPARISON:  None Available. FINDINGS: The bones appear mildly demineralized. No evidence of acute fracture or dislocation. There are mild glenohumeral and acromioclavicular degenerative changes. The subacromial space appears adequately preserved. Left subclavian AICD leads are partially imaged. IMPRESSION: No evidence of acute fracture or dislocation. Electronically Signed   By: Richardean Sale M.D.   On: 02/11/2022 16:28   DG Chest 2 View  Result Date: 02/11/2022 CLINICAL DATA:  Weakness. EXAM: CHEST - 2 VIEW COMPARISON:  Chest x-ray 01/29/2022 FINDINGS: Cardiomediastinal silhouette is within normal limits. Left-sided pacemaker is present. The lungs are clear. There is no pleural effusion or pneumothorax. No acute fractures are identified. IMPRESSION: No active cardiopulmonary disease. Electronically Signed   By: Ronney Asters M.D.   On: 02/11/2022 16:26      Time coordinating discharge: Over 30 minutes  SIGNED:  Emeterio Reeve DO [facility]

## 2022-02-12 NOTE — Assessment & Plan Note (Addendum)
-   This could be contributing to his recurrent falls and generalized weakness as well as presyncope. - The patient is status post AICD with no discharge. - He will be admitted to an observation progressive unit bed. - We will monitor for further arrhythmias. - We will optimize his electrolytes. - Cardiology consult will be obtained. - I notified Dr. Leonides Schanz about the patient. - PT consult to be obtained.

## 2022-02-12 NOTE — Assessment & Plan Note (Signed)
-   We will continue PPI therapy 

## 2022-02-13 ENCOUNTER — Other Ambulatory Visit: Payer: Self-pay

## 2022-02-13 NOTE — ED Notes (Signed)
Pt and daughter in law verbalized understanding of d/c instructions, prescription and follow up care. Pt A&OX4, wheeled out of ED via wheelchair.

## 2022-02-13 NOTE — TOC Transition Note (Signed)
Transition of Care [facility]) - CM/SW Discharge Note   Patient Details  Name: Matthew Zamora MRN: [id number] Date of Birth: 03-Aug-1940  [facility] CM/SW Contact:  Shelbie Hutching, RN Phone Number: 02/13/2022, 2:26 PM   Clinical Narrative:    Patient is medically cleared for discharge home with home health services.  Patient did much better with PT today so recommendations changed to home with home health.  Patient agrees to having PT and OT set up at home.  Merleen Nicely with [facility] accepted referral, patient's address is [address]- best contact number is [phone].  Daughter in law will be picking patient up this afternoon when she gets off work.  Daughter in law is a Marine scientist and checks on patient morning and night at home.    Final next level of care: [place] Barriers to Discharge: Barriers Resolved   Patient Goals and CMS Choice Patient states their goals for this hospitalization and ongoing recovery are:: Patient good with going home with home health CMS Medicare.gov Compare Post Acute Care list provided to:: Patient Choice offered to / list presented to : Patient  Discharge Placement                       Discharge Plan and Services   Discharge Planning Services: CM Consult Post Acute Care Choice: [place]          DME Arranged: N/A DME Agency: NA       HH Arranged: PT, OT [place] Agency: [facility] [place] Date [place] Agency Contacted: 02/13/22 Time [place]: [id number] Representative spoke with at [place]: [place] Determinants of Health ([place]) Interventions     Readmission Risk Interventions     No data to display

## 2022-02-13 NOTE — Progress Notes (Signed)
PROGRESS NOTE    Matthew Zamora  LZJ:[id number] DOB: 1940/10/06 DOA: 02/11/2022 PCP: [place] Pink, MD    Brief Narrative:  Matthew Zamora is  81 y.o. male with medical history significant for multiple medical problems that are mentioned below, who presented to the emergency room with acute onset of recurrent falls with generalized weakness, dizziness and lightheadedness as well as presyncope upon standing and was noted to have a 15 beat run of unsustained ventricular tachycardia in the emergency room.  He stated he has not been eating or drinking as much and feels he was dehydrated.  ED Course: In the ER, BP was 93/65 and got better with hydration with otherwise normal vital signs.  Labs revealed mild hyponatremia and a creatinine 1.83 better than previous levels. High sensitive troponin I was 10 and later 12.  Hemoglobin hematocrit were 10.3 and 32.7 close to baseline. EKG atrial paced rhythm with prolonged AV conduction with a rate of 61 Imaging: Two-view chest x-ray showed no acute cardiopulmonary disease and right shoulder x-ray showed no evidence for fracture or dislocation. Treatment: The patient was given half a liter bolus of IV normal saline.  His AICD was interrogated and showed no firing.  He was admitted to a progressive unit observation bed for further evaluation and management. 08/29: Cardiology recommendations: No arrhythmia detected on device interrogation, telemetry strip of concern for VT was possible ventricular paced beats due to slow HR.  Discontinued spironolactone, do not restart, due to significant transaminitis.  Cleared for discharge from cardiology standpoint if able to ambulate/symptoms have resolved.  Patient reports improvement post fluids, reduced p.o. intake at home. 08/30: Patient participated in physical therapy physical therapy recommended home health services.  Patient is being discharged home    Assessment & Plan:   Principal Problem:   NSVT  (nonsustained ventricular tachycardia) ([name]) Active Problems:   Paroxysmal atrial fibrillation ([name])   Hyperlipidemia   CKD (chronic kidney disease) stage 3, GFR 30-59 ml/min ([name])   GERD without esophagitis   NSVT (nonsustained ventricular tachycardia) ([place]) Likely a cause of recurrent falls and generalized weakness as well as presyncope. The patient is status post AICD with no discharge. No arrhythmia detected on device interrogation, telemetry strip of concern for VT was possible ventricular paced beats due to slow HR.   Discontinued spironolactone, do not restart, due to significant transaminitis.    Patient reports improvement post fluids, reduced p.o. intake at home.  Encourage p.o. fluid intake to 5 or 6 16 ounce bottles per day continue torsemide 20 mg as needed for weight gain / edema Cleared for discharge from cardiology standpoint if able to ambulate/symptoms have resolved.     Paroxysmal atrial fibrillation ([name]) continue amiodarone and mexitil Continue Eliquis Toprol XL dose reduced d/t soft BP and concern for fall risk    Hyperlipidemia continue statin therapy.   GERD without esophagitis continue PPI therapy.   CKD (chronic kidney disease) stage 3, GFR 30-59 ml/min ([name]) Monitor BMP   Debility and falls PT recommending SNF Medication changes as above   DVT prophylaxis: Eliquis Code Status: Full code. Family Communication: No family at bed side. Disposition Plan:  PT and OT recommended home health services.  Patient is being discharged home    Consultants:  Cardiology  Procedures: None Antimicrobials:None   Subjective: Patient was seen and examined at bedside.  Overnight events noted.  Patient reports feeling very much improved.  Wants to be discharged.  Denies any further dizziness.  Objective: Vitals:  02/13/22 1145 02/13/22 1400 02/13/22 1500 02/13/22 1637  BP: 139/77 109/83 122/63 120/60  Pulse: 86 63 60 65  Resp: '19 16 14 16  '$ Temp:       TempSrc:      SpO2: 100% 99% 100% 100%  Weight:      Height:       No intake or output data in the 24 hours ending 02/13/22 1733 Filed Weights   02/11/22 1534  Weight: 65.8 kg    Examination:  General exam: Appears comfortable, not in any acute distress.  Deconditioned. Respiratory system: CTA bilaterally, respiratory effort normal, RR 15 . Cardiovascular system: S1 & S2 heard, regular rate and rhythm, no murmur. Gastrointestinal system: Abdomen is soft, non tender, non distended, BS+ Central nervous system: Alert and oriented x 3. No focal neurological deficits. Extremities: No edema, no cyanosis, no clubbing. Skin: No rashes, lesions or ulcers Psychiatry: Judgement and insight appear normal. Mood & affect appropriate.     Data Reviewed: I have personally reviewed following labs and imaging studies  CBC: Recent Labs  Lab 02/11/22 1558 02/12/22 0340  WBC 5.5 5.0  NEUTROABS 4.2  --   HGB 10.3* 9.4*  HCT 32.7* 30.6*  MCV 94.0 96.2  PLT 178 720   Basic Metabolic Panel: Recent Labs  Lab 02/11/22 1558 02/11/22 2054 02/12/22 0340  NA 134*  --  138  K 4.4  --  3.9  CL 103  --  110  CO2 26  --  24  GLUCOSE 100*  --  110*  BUN 17  --  17  CREATININE 1.83*  --  1.58*  CALCIUM 9.2  --  8.6*  MG  --  2.4  --    GFR: Estimated Creatinine Clearance: 34.7 mL/min (A) (by C-G formula based on SCr of 1.58 mg/dL (H)). Liver Function Tests: Recent Labs  Lab 02/11/22 1558  AST 29  ALT 27  ALKPHOS 65  BILITOT 0.8  PROT 6.3*  ALBUMIN 3.7   No results for input(s): "LIPASE", "AMYLASE" in the last 168 hours. No results for input(s): "AMMONIA" in the last 168 hours. Coagulation Profile: No results for input(s): "INR", "PROTIME" in the last 168 hours. Cardiac Enzymes: No results for input(s): "CKTOTAL", "CKMB", "CKMBINDEX", "TROPONINI" in the last 168 hours. BNP (last 3 results) No results for input(s): "PROBNP" in the last 8760 hours. HbA1C: No results for  input(s): "HGBA1C" in the last 72 hours. CBG: No results for input(s): "GLUCAP" in the last 168 hours. Lipid Profile: No results for input(s): "CHOL", "HDL", "LDLCALC", "TRIG", "CHOLHDL", "LDLDIRECT" in the last 72 hours. Thyroid Function Tests: No results for input(s): "TSH", "T4TOTAL", "FREET4", "T3FREE", "THYROIDAB" in the last 72 hours. Anemia Panel: No results for input(s): "VITAMINB12", "FOLATE", "FERRITIN", "TIBC", "IRON", "RETICCTPCT" in the last 72 hours. Sepsis Labs: No results for input(s): "PROCALCITON", "LATICACIDVEN" in the last 168 hours.  No results found for this or any previous visit (from the past 240 hour(s)).   Radiology Studies: CT Head Wo Contrast  Result Date: 02/11/2022 CLINICAL DATA:  Head and neck trauma. Multiple recent falls. No LOC. Dizziness. EXAM: CT HEAD WITHOUT CONTRAST CT CERVICAL SPINE WITHOUT CONTRAST TECHNIQUE: Multidetector CT imaging of the head and cervical spine was performed following the standard protocol without intravenous contrast. Multiplanar CT image reconstructions of the cervical spine were also generated. RADIATION DOSE REDUCTION: This exam was performed according to the departmental dose-optimization program which includes automated exposure control, adjustment of the mA and/or kV according to patient  size and/or use of iterative reconstruction technique. COMPARISON:  CT examination dated January 29, 2022 FINDINGS: CT HEAD FINDINGS Brain: No evidence of acute infarction, hemorrhage, hydrocephalus, extra-axial collection or mass lesion/mass effect. Patchy area of low-attenuation in the periventricular white matter presumed chronic microvascular ischemic changes. Vascular: No hyperdense vessel or unexpected calcification. Skull: Normal. Negative for fracture or focal lesion. Sinuses/Orbits: No acute finding. Other: None. CT CERVICAL SPINE FINDINGS Alignment: Normal. Skull base and vertebrae: No acute fracture. No primary bone lesion or focal  pathologic process. Soft tissues and spinal canal: No prevertebral fluid or swelling. No visible canal hematoma. 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 Mild multilevel facet joint arthropathy. C2-C3: Mild left and moderate right facet joint arthropathy. No significant spinal canal or neural foraminal stenosis. C3-C4: Moderate facet joint arthropathy with mild left neural foraminal stenosis. C4-C5: Moderate facet joint arthropathy left worse than the right. No significant spinal canal or neural foraminal stenosis. C5-C6: Disc osteophyte complex with mild spinal canal stenosis. Mild bilateral facet joint arthropathy. C6-C7: Disc osteophyte complex with mild spinal canal stenosis. Mild bilateral neural foraminal stenosis. Mild facet joint arthropathy. C7-T1:  No significant finding. Upper chest: Negative. Other: IMPRESSION: CT head: 1.  No acute intracranial abnormality. 2. Mild chronic microvascular ischemic changes of the white matter, unchanged. CT cervical spine: 1. No acute fracture or traumatic subluxation. 2. Paraspinal soft tissues are unremarkable. 3. 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 Electronically Signed   By: Keane Police D.O.   On: 02/11/2022 21:51   CT Cervical Spine Wo Contrast  Result Date: 02/11/2022 CLINICAL DATA:  Head and neck trauma. Multiple recent falls. No LOC. Dizziness. EXAM: CT HEAD WITHOUT CONTRAST CT CERVICAL SPINE WITHOUT CONTRAST TECHNIQUE: Multidetector CT imaging of the head and cervical spine was performed following the standard protocol without intravenous contrast. Multiplanar CT image reconstructions of the cervical spine were also generated. RADIATION DOSE REDUCTION: This exam was performed according to the departmental dose-optimization program which includes automated exposure control, adjustment of the mA and/or kV according to patient  size and/or use of iterative reconstruction technique. COMPARISON:  CT examination dated January 29, 2022 FINDINGS: CT HEAD FINDINGS Brain: No evidence of acute infarction, hemorrhage, hydrocephalus, extra-axial collection or mass lesion/mass effect. Patchy area of low-attenuation in the periventricular white matter presumed chronic microvascular ischemic changes. Vascular: No hyperdense vessel or unexpected calcification. Skull: Normal. Negative for fracture or focal lesion. Sinuses/Orbits: No acute finding. Other: None. CT CERVICAL SPINE FINDINGS Alignment: Normal. Skull base and vertebrae: No acute fracture. No primary bone lesion or focal pathologic process. Soft tissues and spinal canal: No prevertebral fluid or swelling. No visible canal hematoma. 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 Mild multilevel facet joint arthropathy. C2-C3: Mild left and moderate right facet joint arthropathy. No significant spinal canal or neural foraminal stenosis. C3-C4: Moderate facet joint arthropathy with mild left neural foraminal stenosis. C4-C5: Moderate facet joint arthropathy left worse than the right. No significant spinal canal or neural foraminal stenosis. C5-C6: Disc osteophyte complex with mild spinal canal stenosis. Mild bilateral facet joint arthropathy. C6-C7: Disc osteophyte complex with mild spinal canal stenosis. Mild bilateral neural foraminal stenosis. Mild facet joint arthropathy. C7-T1:  No significant finding. Upper chest: Negative. Other: IMPRESSION: CT head: 1.  No acute intracranial abnormality. 2. Mild chronic microvascular ischemic changes of the white matter, unchanged. CT cervical spine: 1. No  acute fracture or traumatic subluxation. 2. Paraspinal soft tissues are unremarkable. 3. 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 Electronically Signed   By: Keane Police D.O.    On: 02/11/2022 21:51    Scheduled Meds:  amiodarone  400 mg Oral Daily   apixaban  2.5 mg Oral BID   ascorbic acid  500 mg Oral Daily   aspirin  81 mg Oral Q1200   atorvastatin  80 mg Oral Daily   gabapentin  100 mg Oral QHS   magnesium oxide  400 mg Oral Daily   melatonin  10 mg Oral QHS   metoprolol succinate  25 mg Oral BID   mexiletine  150 mg Oral BID   multivitamin with minerals  1 tablet Oral Daily   pantoprazole  40 mg Oral Daily   tamsulosin  0.4 mg Oral Daily   Continuous Infusions:   LOS: 0 days    Time spent: 35 mins    Matthew Fowers, MD [facility]   If 7PM-7AM, please contact night-coverage

## 2022-02-13 NOTE — ED Notes (Signed)
Patient with run of wide complex tachycardia. Denies chest pain, shortness of breath or dizziness. EKG obtained and K. Foust notified via secure chat.

## 2022-02-13 NOTE — Progress Notes (Addendum)
Physical Therapy Treatment Patient Details Name: Matthew Zamora MRN: [id number] DOB: 29-Jun-1940 Today's Date: 02/13/2022   History of Present Illness Per MD:  Matthew Zamora is a 81 y.o. male with PMH of CAD, CHF, pacemaker, A-fib on Eliquis who presents with progressive generalized weakness over the last several weeks and some frequent falls.  He has presyncope symptoms upon standing, lightheadedness, needs to lower himself down to the ground and rest for resolution of symptoms.  He thinks he may be dehydrated because he is not eating and drinking as much.    PT Comments    Pt was finishing lunch upon arriving. He is A and O x 3 and agreeable to session. " I really want to go home." Pt endorsing feeling much better today than previous date. He is eager to get up and ambulate. Pt required supervision to exit flat bed, stand to RW, and ambulate ~ 200 ft. No LOB or safety concerns. Vitals were stable throughout. Vcs for posture correction only. Pt reports his daughter in-law is a Therapist, sports and will stop in twice a day to check on him. She manages his medicines and is agreeable to pt returning home with HHPT at DC. Author did call daughter in-law and she confirms all this information is accurate. DC recs updated to Home with HHPT. Pt has RW already. No equipment needs identified.    Recommendations for follow up therapy are one component of a multi-disciplinary discharge planning process, led by the attending physician.  Recommendations may be updated based on patient status, additional functional criteria and insurance authorization.  Follow Up Recommendations  Home health PT (If able to confirm pt is able to have assistance at DC. Much improved abilities & safety than from previous date. Author is awaiting pt's listed contacts to return call to confirm.)     Assistance Recommended at Discharge Intermittent Supervision/Assistance  Patient can return home with the following A little help with  walking and/or transfers;A little help with bathing/dressing/bathroom;Assistance with cooking/housework;Direct supervision/assist for medications management;Direct supervision/assist for financial management;Assist for transportation;Help with stairs or ramp for entrance (" My daughter in-law takes care of my medicines." " She comes to see me twice a day.")   Equipment Recommendations  Pt has all equipment needs met      Precautions / Restrictions Precautions Precautions: Fall Restrictions Weight Bearing Restrictions: No     Mobility  Bed Mobility Overal bed mobility: Needs Assistance Bed Mobility: Supine to Sit, Sit to Supine  Supine to sit: Supervision Sit to supine: Supervision  General bed mobility comments: from a flat bed. pt was able to exit bed without physical assistance. Easily able to return to supine after OOB activity.    Transfers Overall transfer level: Needs assistance Equipment used: Rolling walker (2 wheels) Transfers: Sit to/from Stand Sit to Stand: Supervision  General transfer comment: Pt demonstrated improved abilities this date versus previous date.    Ambulation/Gait Ambulation/Gait assistance: Supervision Gait Distance (Feet): 200 Feet Assistive device: Rolling walker (2 wheels) Gait Pattern/deviations: Step-through pattern, Trunk flexed Gait velocity: WNL     General Gait Details: pt was able to ambulate ~ 200 ft with RW without LOB. Vcs for posture correction and to slow cadence. Pt O2 stable with HR and RR responding appropriately. " I think I was just really dehydrated yesterday."    Balance Overall balance assessment: Needs assistance Sitting-balance support: Feet supported Sitting balance-Leahy Scale: Good     Standing balance support: Bilateral upper extremity supported, During functional  activity, Reliant on assistive device for balance Standing balance-Leahy Scale: Fair Standing balance comment: pt does rely on RW during dynamic  standing activity. static standing, pt was able to standing without UE support without LOB.       Cognition Arousal/Alertness: Awake/alert Behavior During Therapy: WFL for tasks assessed/performed Overall Cognitive Status: Within Functional Limits for tasks assessed      General Comments: Pt is A and O x 3. agreeable to session and eager to ambulate to prove he can safely go home               Pertinent Vitals/Pain Pain Assessment Pain Assessment: No/denies pain     PT Goals (current goals can now be found in the care plan section) Acute Rehab PT Goals Patient Stated Goal: go home Progress towards PT goals: Progressing toward goals    Frequency    Min 2X/week      PT Plan Discharge plan needs to be updated       AM-PAC PT "6 Clicks" Mobility   Outcome Measure  Help needed turning from your back to your side while in a flat bed without using bedrails?: A Little Help needed moving from lying on your back to sitting on the side of a flat bed without using bedrails?: A Little Help needed moving to and from a bed to a chair (including a wheelchair)?: A Little Help needed standing up from a chair using your arms (e.g., wheelchair or bedside chair)?: A Little Help needed to walk in hospital room?: A Little Help needed climbing 3-5 steps with a railing? : A Little 6 Click Score: 18    End of Session   Activity Tolerance: Patient tolerated treatment well Patient left: in bed;with call bell/phone within reach;with bed alarm set Nurse Communication: Mobility status PT Visit Diagnosis: Unsteadiness on feet (R26.81);Other abnormalities of gait and mobility (R26.89);Repeated falls (R29.6);Muscle weakness (generalized) (M62.81);History of falling (Z91.81);Difficulty in walking, not elsewhere classified (R26.2)     Time: 3428-7681 PT Time Calculation (min) (ACUTE ONLY): 16 min  Charges:  $Gait Training: 8-22 mins                     Julaine Fusi PTA 02/13/22, 1:15  PM

## 2022-02-13 NOTE — Care Management Obs Status (Signed)
[facility] NOTIFICATION   Patient Details  Name: Matthew Zamora MRN: [id number] Date of Birth: 1940-10-07   Medicare Observation Status Notification Given:  Yes    Shelbie Hutching, RN 02/13/2022, 1:46 PM

## 2022-03-27 ENCOUNTER — Emergency Department: Payer: Medicare Other

## 2022-03-27 ENCOUNTER — Encounter: Payer: Self-pay | Admitting: Emergency Medicine

## 2022-03-27 ENCOUNTER — Emergency Department
Admission: EM | Admit: 2022-03-27 | Discharge: 2022-03-27 | Disposition: A | Discharge status: Home / Self Care | Payer: Medicare Other | Source: Home / Self Care | Attending: Emergency Medicine | Admitting: Emergency Medicine

## 2022-03-27 ENCOUNTER — Other Ambulatory Visit: Payer: Self-pay

## 2022-03-27 ENCOUNTER — Emergency Department
Admission: EM | Admit: 2022-03-27 | Discharge: 2022-03-27 | Disposition: A | Discharge status: Home / Self Care | Payer: Medicare Other | Attending: Emergency Medicine | Admitting: Emergency Medicine

## 2022-03-27 DIAGNOSIS — I110 Hypertensive heart disease with heart failure: Secondary | ICD-10-CM | POA: Insufficient documentation

## 2022-03-27 DIAGNOSIS — I2510 Atherosclerotic heart disease of native coronary artery without angina pectoris: Secondary | ICD-10-CM | POA: Insufficient documentation

## 2022-03-27 DIAGNOSIS — I4891 Unspecified atrial fibrillation: Secondary | ICD-10-CM | POA: Insufficient documentation

## 2022-03-27 DIAGNOSIS — R531 Weakness: Secondary | ICD-10-CM | POA: Insufficient documentation

## 2022-03-27 DIAGNOSIS — I509 Heart failure, unspecified: Secondary | ICD-10-CM | POA: Insufficient documentation

## 2022-03-27 DIAGNOSIS — Z7901 Long term (current) use of anticoagulants: Secondary | ICD-10-CM | POA: Insufficient documentation

## 2022-03-27 DIAGNOSIS — R112 Nausea with vomiting, unspecified: Secondary | ICD-10-CM | POA: Diagnosis not present

## 2022-03-27 DIAGNOSIS — R5383 Other fatigue: Secondary | ICD-10-CM | POA: Diagnosis not present

## 2022-03-27 DIAGNOSIS — Z950 Presence of cardiac pacemaker: Secondary | ICD-10-CM | POA: Diagnosis not present

## 2022-03-27 DIAGNOSIS — S0181XA Laceration without foreign body of other part of head, initial encounter: Secondary | ICD-10-CM | POA: Insufficient documentation

## 2022-03-27 DIAGNOSIS — Z8546 Personal history of malignant neoplasm of prostate: Secondary | ICD-10-CM | POA: Insufficient documentation

## 2022-03-27 DIAGNOSIS — W01198A Fall on same level from slipping, tripping and stumbling with subsequent striking against other object, initial encounter: Secondary | ICD-10-CM | POA: Insufficient documentation

## 2022-03-27 DIAGNOSIS — W19XXXA Unspecified fall, initial encounter: Secondary | ICD-10-CM

## 2022-03-27 LAB — COMPREHENSIVE METABOLIC PANEL
ALT: 42 U/L (ref 0–44)
AST: 39 U/L (ref 15–41)
Albumin: 3.6 g/dL (ref 3.5–5.0)
Alkaline Phosphatase: 84 U/L (ref 38–126)
Anion gap: 8 (ref 5–15)
BUN: 13 mg/dL (ref 8–23)
CO2: 28 mmol/L (ref 22–32)
Calcium: 9.4 mg/dL (ref 8.9–10.3)
Chloride: 100 mmol/L (ref 98–111)
Creatinine, Ser: 1.79 mg/dL — ABNORMAL HIGH (ref 0.61–1.24)
GFR, Estimated: 38 mL/min — ABNORMAL LOW (ref >60)
Glucose, Bld: 100 mg/dL — ABNORMAL HIGH (ref 70–99)
Potassium: 4.5 mmol/L (ref 3.5–5.1)
Sodium: 136 mmol/L (ref 135–145)
Total Bilirubin: 0.7 mg/dL (ref 0.3–1.2)
Total Protein: 6.6 g/dL (ref 6.5–8.1)

## 2022-03-27 LAB — CBC
HCT: 34.2 % — ABNORMAL LOW (ref 39.0–52.0)
Hemoglobin: 11 g/dL — ABNORMAL LOW (ref 13.0–17.0)
MCH: 29.6 pg (ref 26.0–34.0)
MCHC: 32.2 g/dL (ref 30.0–36.0)
MCV: 91.9 fL (ref 80.0–100.0)
Platelets: 185 10*3/uL (ref 150–400)
RBC: 3.72 MIL/uL — ABNORMAL LOW (ref 4.22–5.81)
RDW: 13.3 % (ref 11.5–15.5)
WBC: 6.6 10*3/uL (ref 4.0–10.5)
nRBC: 0 % (ref 0.0–0.2)

## 2022-03-27 LAB — URINALYSIS, ROUTINE W REFLEX MICROSCOPIC
Bilirubin Urine: NEGATIVE
Glucose, UA: NEGATIVE mg/dL
Hgb urine dipstick: NEGATIVE
Ketones, ur: NEGATIVE mg/dL
Nitrite: NEGATIVE
Protein, ur: NEGATIVE mg/dL
Specific Gravity, Urine: 1.028 (ref 1.005–1.030)
pH: 7 (ref 5.0–8.0)

## 2022-03-27 LAB — TROPONIN I (HIGH SENSITIVITY)
Troponin I (High Sensitivity): 16 ng/L (ref <18)
Troponin I (High Sensitivity): 16 ng/L (ref <18)

## 2022-03-27 LAB — LIPASE, BLOOD: Lipase: 37 U/L (ref 11–51)

## 2022-03-27 LAB — CK: Total CK: 70 U/L (ref 49–397)

## 2022-03-27 MED ORDER — SODIUM CHLORIDE 0.9 % IV BOLUS (SEPSIS)
500.0000 mL | Freq: Once | INTRAVENOUS | Status: AC
  Administered 2022-03-27: 500 mL via INTRAVENOUS

## 2022-03-27 MED ORDER — ONDANSETRON HCL 4 MG PO TABS: 4.0000 mg | ORAL_TABLET | Freq: Every day | ORAL | 0 refills | Status: AC | PRN

## 2022-03-27 MED ORDER — ONDANSETRON HCL 4 MG/2ML IJ SOLN
4.0000 mg | Freq: Once | INTRAMUSCULAR | Status: AC
  Administered 2022-03-27: 4 mg via INTRAVENOUS
  Filled 2022-03-27: qty 2

## 2022-03-27 MED ORDER — IOHEXOL 300 MG/ML  SOLN
100.0000 mL | Freq: Once | INTRAMUSCULAR | Status: AC | PRN
  Administered 2022-03-27: 75 mL via INTRAVENOUS

## 2022-03-27 NOTE — ED Provider Triage Note (Signed)
  Emergency Medicine Provider Triage Evaluation Note  Matthew Zamora , a 81 y.o.male,  was evaluated in triage.  Pt complains of injuries from fall.  Patient states that he was walking when he tripped and hit his head on a table.  Denies LOC.  States that he is on blood thinners, but is unsure which kind.  Denies any other pain at this time.   Review of Systems  Positive: Headache Negative: Denies fever, chest pain, vomiting  Physical Exam   Vitals:   03/27/22 1516 03/27/22 1519  BP:  97/60  Pulse: 65   Resp: 16   Temp: 97.6 F (36.4 C)   SpO2: 96%    Gen:   Awake, no distress   Resp:  Normal effort  MSK:   Moves extremities without difficulty  Other:  Laceration noted on the frontal aspect of head.  Medical Decision Making  Given the patient's initial medical screening exam, the following diagnostic evaluation has been ordered. The patient will be placed in the appropriate treatment space, once one is available, to complete the evaluation and treatment. I have discussed the plan of care with the patient and I have advised the patient that an ED physician or mid-level practitioner will reevaluate their condition after the test results have been received, as the results may give them additional insight into the type of treatment they may need.    Diagnostics: Head CT, cervical spine CT  Treatments: none immediately   Teodoro Spray, [place] 03/27/22 1541

## 2022-03-27 NOTE — ED Notes (Signed)
Pt states he has BP medications he has not taken yet. Pt educated to take BP medications as prescribed.

## 2022-03-27 NOTE — ED Notes (Signed)
Pt discharged to lobby. Pt called and messaged family to come and pick him up. Pt taken to lobby and a family friend saw the pt and started to talk to the patient. Pt placed in front, near security desk. First nurse Amber notified that he has called family. First nurse acknowledged pt and saw where he was at. Patient states his daughter will come after she opens up the office she works at.

## 2022-03-27 NOTE — ED Triage Notes (Signed)
Pt to ED after mechanical fall. Pt uses walker and tripped. Laceration to forehead, not approximated. Bleeding controlled. Pt seen here earlier today and treated for dehydration.   No LOC. Hit both knees. Pt is alert and oriented. States this is only recent fall.

## 2022-03-27 NOTE — Discharge Instructions (Signed)
Use zofran for nausea as needed and drink plenty of fluids to stay well hydrated.   Thank you for choosing [place] for your health care today!  Please see your primary doctor this week for a follow up appointment.   If you do not have a primary doctor call the following clinics to establish care:  If you have insurance:  [facility] [phone] [place] [address]   [facility]  [phone] [address] [place] [address]   If you do not have insurance:  [facility]  [phone] [address], [place] [address]  Sometimes, in the early stages of certain disease courses it is difficult to detect in the emergency department evaluation -- so, it is important that you continue to monitor your symptoms and call your doctor right away or return to the emergency department if you develop any new or worsening symptoms.  It was my pleasure to care for you today.   Hoover Brunette Jacelyn Grip, MD

## 2022-03-27 NOTE — ED Provider Notes (Signed)
[facility] Provider Note  Patient Contact: 5:41 PM (approximate)   History   Fall   HPI  Matthew Zamora is a 81 y.o. male with a history of of prostate cancer, hypertension, anemia, CHF, hepatitis and hyperlipidemia, presents to the emergency department after patient had a mechanical fall.  Patient was seen and evaluated here this morning at around 7:30 AM for complaints of some generalized weakness.  Patient received a 500 cc bolus of normal saline and was discharged after patient requested to go home.  Work-up was unremarkable this morning.  Patient states that he was up at home trying to use his walker when he sustained a mechanical fall.  Patient is currently anticoagulated with Eliquis for A-fib.  Patient denies feeling weak shortly before his fall.  I reached out to patient's son who lives approximately 15 minutes from patient.  He states that his dad sits most of the day and is very deconditioned.  He states that him and his wife visit patient twice daily to bring him breakfast and dinner and his brother visits from time to time.  Tetanus status up-to-date.      Physical Exam   Triage Vital Signs: ED Triage Vitals  Enc Vitals Group     BP 03/27/22 1519 97/60     Pulse Rate 03/27/22 1516 65     Resp 03/27/22 1516 16     Temp 03/27/22 1516 97.6 F (36.4 C)     Temp Source 03/27/22 1516 Oral     SpO2 03/27/22 1516 96 %     Weight 03/27/22 1516 140 lb (63.5 kg)     Height 03/27/22 1516 '5\' 6"'$  (1.676 m)     Head Circumference --      Peak Flow --      Pain Score 03/27/22 1523 3     Pain Loc --      Pain Edu? --      Excl. in [place]? --     Most recent vital signs: Vitals:   03/27/22 1516 03/27/22 1519  BP:  97/60  Pulse: 65   Resp: 16   Temp: 97.6 F (36.4 C)   SpO2: 96%      General: Alert and in no acute distress. Eyes:  PERRL. EOMI. Head: No acute traumatic findings ENT:      Nose: No congestion/rhinnorhea.      Mouth/Throat:  Mucous membranes are moist.  Neck: No stridor. No cervical spine tenderness to palpation. Cardiovascular:  Good peripheral perfusion Respiratory: Normal respiratory effort without tachypnea or retractions. Lungs CTAB. Good air entry to the bases with no decreased or absent breath sounds. Gastrointestinal: Bowel sounds 4 quadrants. Soft and nontender to palpation. No guarding or rigidity. No palpable masses. No distention. No CVA tenderness. Musculoskeletal: Full range of motion to all extremities.  Neurologic:  No gross focal neurologic deficits are appreciated.  Skin: Patient has superficial laceration at forehead.  Other:   ED Results / Procedures / Treatments   Labs (all labs ordered are listed, but only abnormal results are displayed) Labs Reviewed  CK      RADIOLOGY  I personally viewed and evaluated these images as part of my medical decision making, as well as reviewing the written report by the radiologist.  ED Provider Interpretation:  No intracranial abnormality on CT head or CT cervical spine. No acute bony abnormality on x rays of bilateral knees.    PROCEDURES:  Critical Care performed: No  Procedures  MEDICATIONS ORDERED IN ED: Medications - No data to display   IMPRESSION / MDM / [place] / ED COURSE  I reviewed the triage vital signs and the nursing notes.                              Assessment and plan:  Fall:   81 year old male presents to the emergency department after patient had a mechanical fall.  Vital signs were reassuring at triage.  On exam, patient did sustain a superficial laceration along superior aspect of forehead that did not warrant repair with sutures or staples.  Patient's wound was cleansed in the emergency department.  I spoke to both patient's son Matthew Zamora and daughter-in-law Matthew Zamora on the phone.  Patient does not have healthcare power of attorney and adamantly does not want to be in a skilled nursing facility at this  time.  They are concerned as patient has been most of his time sitting and is overall deconditioned in their opinion.  I reviewed the labs from patient's earlier encounter which were overall reassuring.  Patient states that his most recent fall which was mechanical in nature and he did not feel weak or dizzy prior to fall.  He states that he merely tripped.  Patient and family members were cautioned that should symptoms change in home, to return to the emergency department for reevaluation.  They voiced understanding and patient's daughter-in-law, Matthew Zamora was on the way to pick patient up.   FINAL CLINICAL IMPRESSION(S) / ED DIAGNOSES   Final diagnoses:  Fall, initial encounter     Rx / DC Orders   ED Discharge Orders     None        Note:  This document was prepared using Dragon voice recognition software and may include unintentional dictation errors.   Vallarie Mare [place], PA-C 03/27/22 Kristian Covey    Blake Divine, MD 03/27/22 2259

## 2022-03-27 NOTE — ED Provider Triage Note (Signed)
Emergency Medicine Provider Triage Evaluation Note  Matthew Zamora , a 81 y.o. male  was evaluated in triage.  Pt complains of nausea/vomiting.  Review of Systems  Positive: Nausea/vomiting Negative: Diarrhea  Physical Exam  BP (!) 90/57 (BP Location: Right Arm)   Pulse (!) 58   Temp 98.6 F (37 C) (Oral)   Resp 18   SpO2 94%  Gen:   Awake, no distress   Resp:  Normal effort  MSK:   Moves extremities without difficulty  Other:  Generalized abdominal tenderness to palpation without rebound or guarding  Medical Decision Making  Medically screening exam initiated at 4:23 AM.  Appropriate orders placed.  BONNIE ROIG was informed that the remainder of the evaluation will be completed by another provider, this initial triage assessment does not replace that evaluation, and the importance of remaining in the ED until their evaluation is complete.  81 year old male presenting with abdominal pain, nausea and vomiting.  Will obtain basic lab work, CT scan while patient is awaiting treatment room.   Paulette Blanch, MD 03/27/22 [phone]

## 2022-03-27 NOTE — ED Notes (Signed)
PT states coming into the hospital for dehydration. Pt states he has not been drinking fluids for the last 3 days and when he stood up he got dizzy. Pt states feeling better now and denies pain. Pt states he is from home.

## 2022-03-27 NOTE — ED Notes (Signed)
Wound on head cleaned and dressed by this RN. Family informed to clean with soap and water when they get home.

## 2022-03-27 NOTE — ED Triage Notes (Addendum)
Pt arrived via [facility] from home where pt lives with son. Pt to ED with c/o N/V x1 week and increased weakness over last 24 hours. Pt took '4mg'$  PO Zofran prior to EMS arrival. Pt wears 2L oxygen PRN.

## 2022-03-27 NOTE — ED Provider Notes (Signed)
[facility] Provider Note    Event Date/Time   First MD Initiated Contact with Patient 03/27/22 [phone]     (approximate)   History   Weakness   HPI  Matthew Zamora is a 81 y.o. male   Past medical history of A-fib on Eliquis, CAD, pacemaker, CHF, hypertension and hyperlipidemia who presents in the emergency department today for feeling like he is dehydrated due to poor p.o. intake, occasional nausea and vomiting over the past several weeks.  Has felt weak, generalized fatigue over the past several days.  Denies diarrhea or blood in the stools.  No trauma.  No fever or chills.  Denies focal infectious symptoms like cough, chest pain, dyspnea, abdominal pain, dysuria.  Lives alone at home and uses walker to ambulate but has a son who lives next door and checks on him frequently.  History was obtained via the patient and review of external medical notes including echo performed February 2023 with ejection fraction 35 to 40%.      Physical Exam   Triage Vital Signs: ED Triage Vitals  Enc Vitals Group     BP 03/27/22 0350 (!) 90/57     Pulse Rate 03/27/22 0350 (!) 58     Resp 03/27/22 0350 18     Temp 03/27/22 0350 98.6 F (37 C)     Temp Source 03/27/22 0350 Oral     SpO2 03/27/22 0350 94 %     Weight --      Height --      Head Circumference --      Peak Flow --      Pain Score 03/27/22 0738 0     Pain Loc --      Pain Edu? --      Excl. in [place]? --     Most recent vital signs: Vitals:   03/27/22 0453 03/27/22 0739  BP: (!) 144/70 (!) 159/81  Pulse: (!) 59 80  Resp: 17 20  Temp:    SpO2: 100% 98%    General: Awake, no distress.  CV:  Regular rate, initially hypotension 90s over 50s resolved to 140s over 70s after fluids. Resp:  Normal effort.  Abd:  No distention.  Nontender to palpation in all quadrants. Other:  Pleasant and cooperative, appears slightly clinically dehydrated with with slightly dry mucous membranes and poor skin  turgor.   ED Results / Procedures / Treatments   Labs (all labs ordered are listed, but only abnormal results are displayed) Labs Reviewed  COMPREHENSIVE METABOLIC PANEL - Abnormal; Notable for the following components:      Result Value   Glucose, Bld 100 (*)    Creatinine, Ser 1.79 (*)    GFR, Estimated 38 (*)    All other components within normal limits  CBC - Abnormal; Notable for the following components:   RBC 3.72 (*)    Hemoglobin 11.0 (*)    HCT 34.2 (*)    All other components within normal limits  URINALYSIS, ROUTINE W REFLEX MICROSCOPIC - Abnormal; Notable for the following components:   Color, Urine YELLOW (*)    APPearance CLEAR (*)    Leukocytes,Ua TRACE (*)    Bacteria, UA RARE (*)    All other components within normal limits  LIPASE, BLOOD  TROPONIN I (HIGH SENSITIVITY)  TROPONIN I (HIGH SENSITIVITY)     I reviewed labs and they are notable for creatinine at baseline 1.7 flat troponins x2  EKG  ED ECG  REPORT I, Lucillie Garfinkel, the attending physician, personally viewed and interpreted this ECG.   Date: 03/27/2022  EKG Time: 0356  Rate: 60  Rhythm: a paced  Axis: nl  Intervals:a paced no ischemic changes    RADIOLOGY I independently reviewed and interpreted CT abdomen pelvis to see no obvious infectious or obstructive pathology   PROCEDURES:  Critical Care performed: No  Procedures   MEDICATIONS ORDERED IN ED: Medications  ondansetron (ZOFRAN) injection 4 mg (4 mg Intravenous Given 03/27/22 0429)  sodium chloride 0.9 % bolus 500 mL (0 mLs Intravenous Stopped 03/27/22 0639)  iohexol (OMNIPAQUE) 300 MG/ML solution 100 mL (75 mLs Intravenous Contrast Given 03/27/22 0502)     IMPRESSION / MDM / ASSESSMENT AND PLAN / ED COURSE  I reviewed the triage vital signs and the nursing notes.                              Differential diagnosis includes, but is not limited to, AKI, electrolyte derangement, intra-abdominal infection or obstruction,  other infection like urinary tract infection or respiratory infection, ACS   MDM: Patient with evidence of clinical dehydration and history suggesting the same, responded to fluids gentle 500 cc in the setting of heart failure patient feels markedly improved and blood pressure improved as well.  Labs largely unremarkable and a CT scan of the abdomen pelvis shows no emergent pathology, pending chest x-ray and urinalysis to complete infectious work-up, patient appears well and would like to be discharged from the emergency department today.   Patient's presentation is most consistent with acute presentation with potential threat to life or bodily function.       FINAL CLINICAL IMPRESSION(S) / ED DIAGNOSES   Final diagnoses:  None     Rx / DC Orders   ED Discharge Orders     None        Note:  This document was prepared using Dragon voice recognition software and may include unintentional dictation errors.    Lucillie Garfinkel, MD 03/27/22 1349

## 2022-04-10 ENCOUNTER — Emergency Department
Admission: EM | Admit: 2022-04-10 | Discharge: 2022-04-10 | Disposition: A | Discharge status: Home / Self Care | Payer: Medicare Other | Attending: Emergency Medicine | Admitting: Emergency Medicine

## 2022-04-10 ENCOUNTER — Other Ambulatory Visit: Payer: Self-pay

## 2022-04-10 ENCOUNTER — Emergency Department: Payer: Medicare Other

## 2022-04-10 ENCOUNTER — Encounter: Payer: Self-pay | Admitting: Emergency Medicine

## 2022-04-10 DIAGNOSIS — E860 Dehydration: Secondary | ICD-10-CM | POA: Diagnosis not present

## 2022-04-10 DIAGNOSIS — I951 Orthostatic hypotension: Secondary | ICD-10-CM | POA: Diagnosis not present

## 2022-04-10 DIAGNOSIS — Z20822 Contact with and (suspected) exposure to covid-19: Secondary | ICD-10-CM | POA: Insufficient documentation

## 2022-04-10 DIAGNOSIS — N189 Chronic kidney disease, unspecified: Secondary | ICD-10-CM | POA: Insufficient documentation

## 2022-04-10 DIAGNOSIS — Z950 Presence of cardiac pacemaker: Secondary | ICD-10-CM | POA: Diagnosis not present

## 2022-04-10 DIAGNOSIS — I129 Hypertensive chronic kidney disease with stage 1 through stage 4 chronic kidney disease, or unspecified chronic kidney disease: Secondary | ICD-10-CM | POA: Insufficient documentation

## 2022-04-10 DIAGNOSIS — I959 Hypotension, unspecified: Secondary | ICD-10-CM | POA: Diagnosis present

## 2022-04-10 LAB — BASIC METABOLIC PANEL
Anion gap: 3 — ABNORMAL LOW (ref 5–15)
BUN: 13 mg/dL (ref 8–23)
CO2: 21 mmol/L — ABNORMAL LOW (ref 22–32)
Calcium: 7.6 mg/dL — ABNORMAL LOW (ref 8.9–10.3)
Chloride: 111 mmol/L (ref 98–111)
Creatinine, Ser: 1.76 mg/dL — ABNORMAL HIGH (ref 0.61–1.24)
GFR, Estimated: 39 mL/min — ABNORMAL LOW (ref >60)
Glucose, Bld: 77 mg/dL (ref 70–99)
Potassium: 3.8 mmol/L (ref 3.5–5.1)
Sodium: 135 mmol/L (ref 135–145)

## 2022-04-10 LAB — LACTIC ACID, PLASMA: Lactic Acid, Venous: 1.4 mmol/L (ref 0.5–1.9)

## 2022-04-10 LAB — RESP PANEL BY RT-PCR (FLU A&B, COVID) ARPGX2
Influenza A by PCR: NEGATIVE
Influenza B by PCR: NEGATIVE
SARS Coronavirus 2 by RT PCR: NEGATIVE

## 2022-04-10 LAB — CBC
HCT: 33.8 % — ABNORMAL LOW (ref 39.0–52.0)
Hemoglobin: 10.8 g/dL — ABNORMAL LOW (ref 13.0–17.0)
MCH: 29.5 pg (ref 26.0–34.0)
MCHC: 32 g/dL (ref 30.0–36.0)
MCV: 92.3 fL (ref 80.0–100.0)
Platelets: 168 10*3/uL (ref 150–400)
RBC: 3.66 MIL/uL — ABNORMAL LOW (ref 4.22–5.81)
RDW: 13.6 % (ref 11.5–15.5)
WBC: 5.6 10*3/uL (ref 4.0–10.5)
nRBC: 0 % (ref 0.0–0.2)

## 2022-04-10 LAB — MAGNESIUM: Magnesium: 1.9 mg/dL (ref 1.7–2.4)

## 2022-04-10 LAB — TROPONIN I (HIGH SENSITIVITY): Troponin I (High Sensitivity): 11 ng/L (ref <18)

## 2022-04-10 MED ORDER — SODIUM CHLORIDE 0.9 % IV BOLUS
1000.0000 mL | Freq: Once | INTRAVENOUS | Status: AC
  Administered 2022-04-10: 1000 mL via INTRAVENOUS

## 2022-04-10 NOTE — ED Triage Notes (Signed)
Pt presents via [facility] for syncopal episode & hypotension. Pt reports history of same. Pt has pacemaker. Pt alert and oriented x4 upon arrival. Pt denies hitting head with syncope- reports he was sitting in chair. Pt reports he is on blood thinners but unsure the name. At baseline- pt lives independently and can walk with cane.

## 2022-04-10 NOTE — ED Provider Notes (Signed)
[facility] Provider Note    Event Date/Time   First MD Initiated Contact with Patient 04/10/22 1445     (approximate)   History   Hypotension   HPI  Matthew Zamora is a 81 y.o. male with past medical history significant for paroxysmal atrial fibrillation with pacemaker, hypertension, CKD, hyperlipidemia, presents to the emergency department following an episode of hypotension.  Patient states that he was in his normal state of health today and was going to a follow-up appointment with his primary care physician.  States that while he was there he started to not feel well.  Folic he was going to pass out.  His blood pressure was low at the primary care physician office.  States that since he is arrived to the emergency department he is feeling slightly better.  Denies any chest pain or shortness of breath.  Denies any changes of medications.  Denies any blood in his stool.  No abdominal pain.  Denies nausea vomiting or diarrhea.  Denies any shortness of breath or chest pain.  No dysuria, urinary urgency or frequency.  Does have decreased p.o. intake.  Recurrent episodes of dehydration and visits to the emergency department for low blood pressure in the past where he gets a liter of fluid and then it improves.      Physical Exam   Triage Vital Signs: ED Triage Vitals  Enc Vitals Group     BP 04/10/22 1445 137/65     Pulse Rate 04/10/22 1445 62     Resp 04/10/22 1445 12     Temp 04/10/22 1453 98.4 F (36.9 C)     Temp Source 04/10/22 1453 Oral     SpO2 04/10/22 1445 100 %     Weight --      Height --      Head Circumference --      Peak Flow --      Pain Score 04/10/22 1453 0     Pain Loc --      Pain Edu? --      Excl. in [place]? --     Most recent vital signs: Vitals:   04/10/22 1555 04/10/22 1600  BP: (!) 84/56 128/68  Pulse: 86 70  Resp: 16 18  Temp:    SpO2: 100% 98%    Physical Exam Constitutional:      Appearance: He is  well-developed.  HENT:     Head: Atraumatic.  Eyes:     Conjunctiva/sclera: Conjunctivae normal.  Cardiovascular:     Rate and Rhythm: Regular rhythm.  Pulmonary:     Effort: No respiratory distress.  Musculoskeletal:     Cervical back: Normal range of motion.  Skin:    General: Skin is warm.  Neurological:     Mental Status: He is alert. Mental status is at baseline.          IMPRESSION / MDM / ASSESSMENT AND PLAN / ED COURSE  I reviewed the triage vital signs and the nursing notes.  Differential diagnosis including dehydration, ACS, dysrhythmia, urinary tract infection, pneumonia, COVID.  Patient was given 1 L of IV fluids.  On chart review patient had orthostatic blood pressures that were positive at primary care physician office.  Do not see that he received any IV fluids prior to arrival.     EKG  EKG showed atrial paced rhythm.  Nonspecific changes.  QTc 462.  No significant change when compared to prior EKG on 03/27/2022.  RADIOLOGY  I independently reviewed imaging, my interpretation of imaging: No signs of pneumonia or pulmonary edema.  Read as no acute findings.    ED Results / Procedures / Treatments   Labs (all labs ordered are listed, but only abnormal results are displayed) Labs interpreted as -   Creatinine appears to be at baseline.  Mildly low CO2.  No significant electrolyte abnormalities.  Magnesium normal.  UA currently pending.  Patient most likely with dehydration.  Plan to reevaluate after urine and IV fluids.  Care transferred to Dr. Georgina Pillion Reviewed  BASIC METABOLIC PANEL - Abnormal; Notable for the following components:      Result Value   CO2 21 (*)    Creatinine, Ser 1.76 (*)    Calcium 7.6 (*)    GFR, Estimated 39 (*)    Anion gap 3 (*)    All other components within normal limits  CBC - Abnormal; Notable for the following components:   RBC 3.66 (*)    Hemoglobin 10.8 (*)    HCT 33.8 (*)    All other components within  normal limits  RESP PANEL BY RT-PCR (FLU A&B, COVID) ARPGX2  LACTIC ACID, PLASMA  MAGNESIUM  URINALYSIS, ROUTINE W REFLEX MICROSCOPIC  LACTIC ACID, PLASMA  TROPONIN I (HIGH SENSITIVITY)       PROCEDURES:  Critical Care performed: No  Procedures  Patient's presentation is most consistent with acute presentation with potential threat to life or bodily function.   MEDICATIONS ORDERED IN ED: Medications  sodium chloride 0.9 % bolus 1,000 mL (1,000 mLs Intravenous New Bag/Given 04/10/22 1540)    FINAL CLINICAL IMPRESSION(S) / ED DIAGNOSES   Final diagnoses:  Dehydration  Orthostatic hypotension     Rx / DC Orders   ED Discharge Orders     None        Note:  This document was prepared using Dragon voice recognition software and may include unintentional dictation errors.   Nathaniel Man, MD 04/10/22 [phone]

## 2022-04-17 ENCOUNTER — Emergency Department: Payer: Medicare Other

## 2022-04-17 ENCOUNTER — Observation Stay: Payer: Medicare Other

## 2022-04-17 ENCOUNTER — Inpatient Hospital Stay
Admission: EM | Admit: 2022-04-17 | Discharge: 2022-05-13 | DRG: 640 | Disposition: A | Discharge status: Skilled Nursing Facility | Payer: Medicare Other | Attending: Internal Medicine | Admitting: Internal Medicine

## 2022-04-17 ENCOUNTER — Other Ambulatory Visit: Payer: Self-pay

## 2022-04-17 ENCOUNTER — Encounter: Payer: Self-pay | Admitting: Internal Medicine

## 2022-04-17 DIAGNOSIS — R7989 Other specified abnormal findings of blood chemistry: Secondary | ICD-10-CM | POA: Diagnosis not present

## 2022-04-17 DIAGNOSIS — E876 Hypokalemia: Secondary | ICD-10-CM | POA: Diagnosis not present

## 2022-04-17 DIAGNOSIS — R55 Syncope and collapse: Secondary | ICD-10-CM | POA: Diagnosis not present

## 2022-04-17 DIAGNOSIS — E871 Hypo-osmolality and hyponatremia: Secondary | ICD-10-CM | POA: Diagnosis present

## 2022-04-17 DIAGNOSIS — Z8249 Family history of ischemic heart disease and other diseases of the circulatory system: Secondary | ICD-10-CM

## 2022-04-17 DIAGNOSIS — I951 Orthostatic hypotension: Secondary | ICD-10-CM | POA: Diagnosis present

## 2022-04-17 DIAGNOSIS — R531 Weakness: Secondary | ICD-10-CM

## 2022-04-17 DIAGNOSIS — I5043 Acute on chronic combined systolic (congestive) and diastolic (congestive) heart failure: Secondary | ICD-10-CM | POA: Diagnosis not present

## 2022-04-17 DIAGNOSIS — I2510 Atherosclerotic heart disease of native coronary artery without angina pectoris: Secondary | ICD-10-CM | POA: Diagnosis present

## 2022-04-17 DIAGNOSIS — Z7401 Bed confinement status: Secondary | ICD-10-CM

## 2022-04-17 DIAGNOSIS — E860 Dehydration: Principal | ICD-10-CM | POA: Diagnosis present

## 2022-04-17 DIAGNOSIS — E861 Hypovolemia: Secondary | ICD-10-CM | POA: Diagnosis present

## 2022-04-17 DIAGNOSIS — R040 Epistaxis: Secondary | ICD-10-CM | POA: Insufficient documentation

## 2022-04-17 DIAGNOSIS — R7401 Elevation of levels of liver transaminase levels: Secondary | ICD-10-CM | POA: Diagnosis present

## 2022-04-17 DIAGNOSIS — E43 Unspecified severe protein-calorie malnutrition: Secondary | ICD-10-CM | POA: Diagnosis present

## 2022-04-17 DIAGNOSIS — Z7982 Long term (current) use of aspirin: Secondary | ICD-10-CM

## 2022-04-17 DIAGNOSIS — I10 Essential (primary) hypertension: Secondary | ICD-10-CM | POA: Diagnosis present

## 2022-04-17 DIAGNOSIS — Z7951 Long term (current) use of inhaled steroids: Secondary | ICD-10-CM

## 2022-04-17 DIAGNOSIS — Z8546 Personal history of malignant neoplasm of prostate: Secondary | ICD-10-CM

## 2022-04-17 DIAGNOSIS — I255 Ischemic cardiomyopathy: Secondary | ICD-10-CM | POA: Diagnosis present

## 2022-04-17 DIAGNOSIS — E875 Hyperkalemia: Secondary | ICD-10-CM | POA: Diagnosis present

## 2022-04-17 DIAGNOSIS — J440 Chronic obstructive pulmonary disease with acute lower respiratory infection: Secondary | ICD-10-CM | POA: Diagnosis not present

## 2022-04-17 DIAGNOSIS — Z87891 Personal history of nicotine dependence: Secondary | ICD-10-CM

## 2022-04-17 DIAGNOSIS — I5022 Chronic systolic (congestive) heart failure: Secondary | ICD-10-CM | POA: Diagnosis not present

## 2022-04-17 DIAGNOSIS — K2210 Ulcer of esophagus without bleeding: Secondary | ICD-10-CM | POA: Diagnosis present

## 2022-04-17 DIAGNOSIS — K5900 Constipation, unspecified: Secondary | ICD-10-CM | POA: Diagnosis present

## 2022-04-17 DIAGNOSIS — R112 Nausea with vomiting, unspecified: Secondary | ICD-10-CM | POA: Diagnosis present

## 2022-04-17 DIAGNOSIS — D631 Anemia in chronic kidney disease: Secondary | ICD-10-CM | POA: Diagnosis present

## 2022-04-17 DIAGNOSIS — Z7901 Long term (current) use of anticoagulants: Secondary | ICD-10-CM

## 2022-04-17 DIAGNOSIS — J449 Chronic obstructive pulmonary disease, unspecified: Secondary | ICD-10-CM | POA: Diagnosis present

## 2022-04-17 DIAGNOSIS — N179 Acute kidney failure, unspecified: Secondary | ICD-10-CM | POA: Diagnosis not present

## 2022-04-17 DIAGNOSIS — R296 Repeated falls: Secondary | ICD-10-CM | POA: Diagnosis present

## 2022-04-17 DIAGNOSIS — D649 Anemia, unspecified: Secondary | ICD-10-CM | POA: Diagnosis present

## 2022-04-17 DIAGNOSIS — R0600 Dyspnea, unspecified: Secondary | ICD-10-CM | POA: Diagnosis not present

## 2022-04-17 DIAGNOSIS — I714 Abdominal aortic aneurysm, without rupture, unspecified: Secondary | ICD-10-CM | POA: Diagnosis present

## 2022-04-17 DIAGNOSIS — Z79899 Other long term (current) drug therapy: Secondary | ICD-10-CM

## 2022-04-17 DIAGNOSIS — N1832 Chronic kidney disease, stage 3b: Secondary | ICD-10-CM | POA: Diagnosis present

## 2022-04-17 DIAGNOSIS — K449 Diaphragmatic hernia without obstruction or gangrene: Secondary | ICD-10-CM | POA: Diagnosis present

## 2022-04-17 DIAGNOSIS — K7689 Other specified diseases of liver: Secondary | ICD-10-CM | POA: Diagnosis present

## 2022-04-17 DIAGNOSIS — J9601 Acute respiratory failure with hypoxia: Secondary | ICD-10-CM | POA: Diagnosis not present

## 2022-04-17 DIAGNOSIS — A040 Enteropathogenic Escherichia coli infection: Secondary | ICD-10-CM | POA: Clinically undetermined

## 2022-04-17 DIAGNOSIS — R627 Adult failure to thrive: Secondary | ICD-10-CM | POA: Diagnosis present

## 2022-04-17 DIAGNOSIS — Z681 Body mass index (BMI) 19 or less, adult: Secondary | ICD-10-CM

## 2022-04-17 DIAGNOSIS — R42 Dizziness and giddiness: Secondary | ICD-10-CM

## 2022-04-17 DIAGNOSIS — I420 Dilated cardiomyopathy: Secondary | ICD-10-CM | POA: Diagnosis present

## 2022-04-17 DIAGNOSIS — R197 Diarrhea, unspecified: Secondary | ICD-10-CM | POA: Diagnosis not present

## 2022-04-17 DIAGNOSIS — I480 Paroxysmal atrial fibrillation: Secondary | ICD-10-CM | POA: Diagnosis present

## 2022-04-17 DIAGNOSIS — E785 Hyperlipidemia, unspecified: Secondary | ICD-10-CM | POA: Diagnosis present

## 2022-04-17 DIAGNOSIS — Z1152 Encounter for screening for COVID-19: Secondary | ICD-10-CM

## 2022-04-17 DIAGNOSIS — I130 Hypertensive heart and chronic kidney disease with heart failure and stage 1 through stage 4 chronic kidney disease, or unspecified chronic kidney disease: Secondary | ICD-10-CM | POA: Diagnosis present

## 2022-04-17 DIAGNOSIS — Z95810 Presence of automatic (implantable) cardiac defibrillator: Secondary | ICD-10-CM

## 2022-04-17 DIAGNOSIS — I4729 Other ventricular tachycardia: Secondary | ICD-10-CM | POA: Diagnosis present

## 2022-04-17 DIAGNOSIS — N183 Chronic kidney disease, stage 3 unspecified: Secondary | ICD-10-CM | POA: Diagnosis present

## 2022-04-17 DIAGNOSIS — I482 Chronic atrial fibrillation, unspecified: Secondary | ICD-10-CM | POA: Diagnosis present

## 2022-04-17 DIAGNOSIS — J189 Pneumonia, unspecified organism: Secondary | ICD-10-CM | POA: Clinically undetermined

## 2022-04-17 LAB — COMPREHENSIVE METABOLIC PANEL
ALT: 136 U/L — ABNORMAL HIGH (ref 0–44)
AST: 129 U/L — ABNORMAL HIGH (ref 15–41)
Albumin: 3.6 g/dL (ref 3.5–5.0)
Alkaline Phosphatase: 86 U/L (ref 38–126)
Anion gap: 6 (ref 5–15)
BUN: 15 mg/dL (ref 8–23)
CO2: 26 mmol/L (ref 22–32)
Calcium: 9.2 mg/dL (ref 8.9–10.3)
Chloride: 100 mmol/L (ref 98–111)
Creatinine, Ser: 1.83 mg/dL — ABNORMAL HIGH (ref 0.61–1.24)
GFR, Estimated: 37 mL/min — ABNORMAL LOW (ref >60)
Glucose, Bld: 95 mg/dL (ref 70–99)
Potassium: 5.5 mmol/L — ABNORMAL HIGH (ref 3.5–5.1)
Sodium: 132 mmol/L — ABNORMAL LOW (ref 135–145)
Total Bilirubin: 1.4 mg/dL — ABNORMAL HIGH (ref 0.3–1.2)
Total Protein: 6.4 g/dL — ABNORMAL LOW (ref 6.5–8.1)

## 2022-04-17 LAB — URINALYSIS, ROUTINE W REFLEX MICROSCOPIC
Bilirubin Urine: NEGATIVE
Glucose, UA: 50 mg/dL — AB
Hgb urine dipstick: NEGATIVE
Ketones, ur: NEGATIVE mg/dL
Leukocytes,Ua: NEGATIVE
Nitrite: NEGATIVE
Protein, ur: NEGATIVE mg/dL
Specific Gravity, Urine: 1.008 (ref 1.005–1.030)
pH: 7 (ref 5.0–8.0)

## 2022-04-17 LAB — CBC
HCT: 32.7 % — ABNORMAL LOW (ref 39.0–52.0)
Hemoglobin: 10.7 g/dL — ABNORMAL LOW (ref 13.0–17.0)
MCH: 29.6 pg (ref 26.0–34.0)
MCHC: 32.7 g/dL (ref 30.0–36.0)
MCV: 90.6 fL (ref 80.0–100.0)
Platelets: 133 10*3/uL — ABNORMAL LOW (ref 150–400)
RBC: 3.61 MIL/uL — ABNORMAL LOW (ref 4.22–5.81)
RDW: 13.6 % (ref 11.5–15.5)
WBC: 9.1 10*3/uL (ref 4.0–10.5)
nRBC: 0 % (ref 0.0–0.2)

## 2022-04-17 LAB — RESP PANEL BY RT-PCR (FLU A&B, COVID) ARPGX2
Influenza A by PCR: NEGATIVE
Influenza B by PCR: NEGATIVE
SARS Coronavirus 2 by RT PCR: NEGATIVE

## 2022-04-17 LAB — TROPONIN I (HIGH SENSITIVITY): Troponin I (High Sensitivity): 11 ng/L (ref <18)

## 2022-04-17 LAB — BRAIN NATRIURETIC PEPTIDE: B Natriuretic Peptide: 354.5 pg/mL — ABNORMAL HIGH (ref 0.0–100.0)

## 2022-04-17 LAB — LIPASE, BLOOD: Lipase: 27 U/L (ref 11–51)

## 2022-04-17 LAB — LACTIC ACID, PLASMA: Lactic Acid, Venous: 1.1 mmol/L (ref 0.5–1.9)

## 2022-04-17 LAB — POTASSIUM: Potassium: 3.7 mmol/L (ref 3.5–5.1)

## 2022-04-17 MED ORDER — VITAMIN C 500 MG PO TABS
500.0000 mg | ORAL_TABLET | Freq: Every day | ORAL | Status: DC
  Administered 2022-04-17 – 2022-05-13 (×26): 500 mg via ORAL
  Filled 2022-04-17 (×27): qty 1

## 2022-04-17 MED ORDER — ASPIRIN 81 MG PO CHEW
81.0000 mg | CHEWABLE_TABLET | Freq: Every day | ORAL | Status: DC
  Administered 2022-04-18 – 2022-05-11 (×24): 81 mg via ORAL
  Filled 2022-04-17 (×24): qty 1

## 2022-04-17 MED ORDER — MEXILETINE HCL 150 MG PO CAPS
150.0000 mg | ORAL_CAPSULE | Freq: Two times a day (BID) | ORAL | Status: DC
  Administered 2022-04-17 – 2022-05-07 (×39): 150 mg via ORAL
  Filled 2022-04-17 (×41): qty 1

## 2022-04-17 MED ORDER — TAMSULOSIN HCL 0.4 MG PO CAPS
0.4000 mg | ORAL_CAPSULE | Freq: Every day | ORAL | Status: DC
  Administered 2022-04-17 – 2022-05-08 (×22): 0.4 mg via ORAL
  Filled 2022-04-17 (×22): qty 1

## 2022-04-17 MED ORDER — MELATONIN 3 MG PO TABS: 9.0000 mg | ORAL_TABLET | Freq: Every day | ORAL | Status: DC

## 2022-04-17 MED ORDER — ADULT MULTIVITAMIN W/MINERALS CH
1.0000 | ORAL_TABLET | Freq: Every day | ORAL | Status: DC
  Administered 2022-04-17 – 2022-05-13 (×25): 1 via ORAL
  Filled 2022-04-17 (×26): qty 1

## 2022-04-17 MED ORDER — ALBUTEROL SULFATE HFA 108 (90 BASE) MCG/ACT IN AERS
2.0000 | INHALATION_SPRAY | RESPIRATORY_TRACT | Status: DC | PRN
  Administered 2022-04-20 – 2022-05-08 (×5): 2 via RESPIRATORY_TRACT
  Filled 2022-04-17: qty 6.7

## 2022-04-17 MED ORDER — SODIUM CHLORIDE 0.9 % IV BOLUS
500.0000 mL | Freq: Once | INTRAVENOUS | Status: AC
  Administered 2022-04-17: 500 mL via INTRAVENOUS

## 2022-04-17 MED ORDER — DEXTROSE 50 % IV SOLN
1.0000 | Freq: Once | INTRAVENOUS | Status: AC
  Administered 2022-04-17: 50 mL via INTRAVENOUS
  Filled 2022-04-17: qty 50

## 2022-04-17 MED ORDER — SODIUM ZIRCONIUM CYCLOSILICATE 10 G PO PACK
10.0000 g | PACK | Freq: Once | ORAL | Status: AC
  Administered 2022-04-17: 10 g via ORAL
  Filled 2022-04-17: qty 1

## 2022-04-17 MED ORDER — GABAPENTIN 100 MG PO CAPS
100.0000 mg | ORAL_CAPSULE | Freq: Every day | ORAL | Status: DC
  Administered 2022-04-17 – 2022-05-07 (×20): 100 mg via ORAL
  Filled 2022-04-17 (×21): qty 1

## 2022-04-17 MED ORDER — MOMETASONE FURO-FORMOTEROL FUM 200-5 MCG/ACT IN AERO
2.0000 | INHALATION_SPRAY | Freq: Two times a day (BID) | RESPIRATORY_TRACT | Status: DC
  Administered 2022-04-18 – 2022-05-13 (×49): 2 via RESPIRATORY_TRACT
  Filled 2022-04-17 (×2): qty 8.8

## 2022-04-17 MED ORDER — AMIODARONE HCL 200 MG PO TABS
400.0000 mg | ORAL_TABLET | Freq: Every day | ORAL | Status: DC
  Administered 2022-04-17 – 2022-04-20 (×4): 400 mg via ORAL
  Filled 2022-04-17 (×4): qty 2

## 2022-04-17 MED ORDER — MELATONIN 5 MG PO TABS
10.0000 mg | ORAL_TABLET | Freq: Every day | ORAL | Status: DC
  Administered 2022-04-17 – 2022-05-07 (×20): 10 mg via ORAL
  Filled 2022-04-17 (×21): qty 2

## 2022-04-17 MED ORDER — HYDRALAZINE HCL 20 MG/ML IJ SOLN: 5.0000 mg | INTRAMUSCULAR | Status: DC | PRN

## 2022-04-17 MED ORDER — INSULIN ASPART 100 UNIT/ML IJ SOLN
6.0000 [IU] | Freq: Once | INTRAMUSCULAR | Status: AC
  Administered 2022-04-17: 6 [IU] via INTRAVENOUS
  Filled 2022-04-17: qty 1

## 2022-04-17 MED ORDER — CALCIUM GLUCONATE-NACL 1-0.675 GM/50ML-% IV SOLN
1.0000 g | Freq: Once | INTRAVENOUS | Status: AC
  Administered 2022-04-17: 1000 mg via INTRAVENOUS
  Filled 2022-04-17: qty 50

## 2022-04-17 MED ORDER — APIXABAN 2.5 MG PO TABS
2.5000 mg | ORAL_TABLET | Freq: Two times a day (BID) | ORAL | Status: DC
  Administered 2022-04-17 – 2022-04-20 (×6): 2.5 mg via ORAL
  Filled 2022-04-17 (×7): qty 1

## 2022-04-17 MED ORDER — METOPROLOL SUCCINATE ER 25 MG PO TB24
25.0000 mg | ORAL_TABLET | Freq: Two times a day (BID) | ORAL | Status: DC
  Administered 2022-04-18: 25 mg via ORAL
  Filled 2022-04-17 (×2): qty 1

## 2022-04-17 MED ORDER — DM-GUAIFENESIN ER 30-600 MG PO TB12
1.0000 | ORAL_TABLET | Freq: Two times a day (BID) | ORAL | Status: DC | PRN
  Administered 2022-04-19 – 2022-05-02 (×10): 1 via ORAL
  Filled 2022-04-17 (×11): qty 1

## 2022-04-17 MED ORDER — PANTOPRAZOLE SODIUM 40 MG PO TBEC
40.0000 mg | DELAYED_RELEASE_TABLET | Freq: Every day | ORAL | Status: DC
  Administered 2022-04-17 – 2022-04-20 (×4): 40 mg via ORAL
  Filled 2022-04-17 (×4): qty 1

## 2022-04-17 MED ORDER — ONDANSETRON HCL 4 MG/2ML IJ SOLN
4.0000 mg | Freq: Three times a day (TID) | INTRAMUSCULAR | Status: DC | PRN
  Administered 2022-04-18 – 2022-04-20 (×2): 4 mg via INTRAVENOUS
  Filled 2022-04-17 (×2): qty 2

## 2022-04-17 MED ORDER — SERTRALINE HCL 50 MG PO TABS
25.0000 mg | ORAL_TABLET | Freq: Every day | ORAL | Status: DC
  Administered 2022-04-17 – 2022-05-13 (×27): 25 mg via ORAL
  Filled 2022-04-17 (×27): qty 1

## 2022-04-17 MED ORDER — CALCIUM GLUCONATE-NACL 1-0.675 GM/50ML-% IV SOLN: 1.0000 g | Freq: Once | INTRAVENOUS | Status: DC

## 2022-04-17 MED ORDER — MAGNESIUM OXIDE -MG SUPPLEMENT 400 (240 MG) MG PO TABS
400.0000 mg | ORAL_TABLET | Freq: Every day | ORAL | Status: DC
  Administered 2022-04-17 – 2022-05-13 (×26): 400 mg via ORAL
  Filled 2022-04-17 (×27): qty 1

## 2022-04-17 MED ORDER — METOPROLOL SUCCINATE ER 50 MG PO TB24: 25.0000 mg | ORAL_TABLET | Freq: Two times a day (BID) | ORAL | Status: DC

## 2022-04-17 NOTE — ED Provider Notes (Signed)
[facility] Provider Note    Event Date/Time   First MD Initiated Contact with Patient 04/17/22 1010     (approximate)   History   Weakness (Weakness and dehydration)   HPI  Matthew Zamora is a 81 y.o. male with history of paroxysmal atrial fibrillation with pacemaker, hypertension, CKD, and hyperlipidemia who presents with nausea and vomiting for [redacted] week along with generalized weakness.  Today he had an episode of vomiting, suddenly felt weaker and was unable to stand up.  He felt lightheaded and felt like he was going to pass out.  He denies any associated chest pain or palpitations.  Has some nausea which has improved.  I reviewed the past medical records.  The patient was seen in the ED on 10/25 after an episode of hypotension but work-up was reassuring.  He was most recently admitted in August and per the hospitalist discharge summary from 8/29 he presented at that time with hypotension, presyncope, and nonsustained ventricular tachycardia.   Physical Exam   Triage Vital Signs: ED Triage Vitals  Enc Vitals Group     BP 04/17/22 1008 126/68     Pulse Rate 04/17/22 1004 70     Resp 04/17/22 1004 14     Temp 04/17/22 1004 97.8 F (36.6 C)     Temp Source 04/17/22 1004 Oral     SpO2 04/17/22 1004 98 %     Weight --      Height --      Head Circumference --      Peak Flow --      Pain Score 04/17/22 1006 0     Pain Loc --      Pain Edu? --      Excl. in [place]? --     Most recent vital signs: Vitals:   04/17/22 1430 04/17/22 1439  BP: (!) 124/58   Pulse: 60   Resp: 11   Temp:  98 F (36.7 C)  SpO2: 100%      General: Alert and oriented, weak appearing but in no acute distress. CV:  Good peripheral perfusion.  Resp:  Normal effort.  Lungs CTAB. Abd:  No distention.  Soft and nontender. Other:  Moist mucous membranes.  EOMI.  PERRLA.  No facial droop.  Motor intact in all extremities.   ED Results / Procedures / Treatments    Labs (all labs ordered are listed, but only abnormal results are displayed) Labs Reviewed  COMPREHENSIVE METABOLIC PANEL - Abnormal; Notable for the following components:      Result Value   Sodium 132 (*)    Potassium 5.5 (*)    Creatinine, Ser 1.83 (*)    Total Protein 6.4 (*)    AST 129 (*)    ALT 136 (*)    Total Bilirubin 1.4 (*)    GFR, Estimated 37 (*)    All other components within normal limits  CBC - Abnormal; Notable for the following components:   RBC 3.61 (*)    Hemoglobin 10.7 (*)    HCT 32.7 (*)    Platelets 133 (*)    All other components within normal limits  BRAIN NATRIURETIC PEPTIDE - Abnormal; Notable for the following components:   B Natriuretic Peptide 354.5 (*)    All other components within normal limits  RESP PANEL BY RT-PCR (FLU A&B, COVID) ARPGX2  LIPASE, BLOOD  LACTIC ACID, PLASMA  URINALYSIS, ROUTINE W REFLEX MICROSCOPIC  POTASSIUM  TROPONIN I (HIGH SENSITIVITY)  EKG  ED ECG REPORT I, Arta Silence, the attending physician, personally viewed and interpreted this ECG.  Date: 04/17/2022 EKG Time: 1005 Rate: 68 Rhythm: Paced versus junctional rhythm QRS Axis: normal Intervals: LBBB ST/T Wave abnormalities: normal Narrative Interpretation: no evidence of acute ischemia    RADIOLOGY  CT head: I independently viewed and interpreted the images; there is no ICH.  Radiology report indicates no acute abnormality.  PROCEDURES:  Critical Care performed: No  Procedures   MEDICATIONS ORDERED IN ED: Medications  albuterol (VENTOLIN HFA) 108 (90 Base) MCG/ACT inhaler 2 puff (has no administration in time range)  dextromethorphan-guaiFENesin (MUCINEX DM) 30-600 MG per 12 hr tablet 1 tablet (has no administration in time range)  ondansetron (ZOFRAN) injection 4 mg (has no administration in time range)  hydrALAZINE (APRESOLINE) injection 5 mg (has no administration in time range)  sodium zirconium cyclosilicate (LOKELMA) packet 10  g (has no administration in time range)  sodium chloride 0.9 % bolus 500 mL (0 mLs Intravenous Stopped 04/17/22 1230)  insulin aspart (novoLOG) injection 6 Units (6 Units Intravenous Given 04/17/22 1342)  dextrose 50 % solution 50 mL (50 mLs Intravenous Given 04/17/22 1334)  calcium gluconate 1 g/ 50 mL sodium chloride IVPB (0 mg Intravenous Stopped 04/17/22 1334)     IMPRESSION / MDM / ASSESSMENT AND PLAN / ED COURSE  I reviewed the triage vital signs and the nursing notes.  81 year old male with PMH as noted above presents with near syncope in the context of nausea and vomiting over the last week.  On exam his vital signs are normal.  Abdomen soft and nontender.  Neurologic exam is nonfocal.  Differential diagnosis includes, but is not limited to, gastroenteritis or other viral syndrome, dehydration/hypovolemia, electrolyte abnormality, other metabolic disturbance, cardiac arrhythmia, less likely CNS etiology.  EKG currently shows what appears to be an atrial paced rhythm.  EMS rhythm strip showed both an atrial paced rhythm and a demand ventricular paced rhythm.  We will keep the patient on the monitor, give a fluid bolus, obtain lab work-up, cardiac enzymes, respiratory panel, CT head, and reassess.  We will also interrogate the pacemaker.  Patient's presentation is most consistent with acute presentation with potential threat to life or bodily function.  The patient is on the cardiac monitor to evaluate for evidence of arrhythmia and/or significant heart rate changes.  ----------------------------------------- 3:00 PM on 04/17/2022 -----------------------------------------  CT head showed no acute findings.  Lab work-up showed mild anemia and negative lactate.  Troponin was also negative.  LFTs are slightly elevated.  Potassium is borderline elevated which could have contributed to the patient's syncope if it caused an arrhythmia.  I have ordered calcium as well as insulin and  dextrose.  Pacemaker interrogation is pending.  Given the hyperkalemia and syncope as well as the possible arrhythmia, I consulted Dr. Reola Calkins from the hospitalist service; based on our discussion he agrees to admit the patient.   FINAL CLINICAL IMPRESSION(S) / ED DIAGNOSES   Final diagnoses:  Hyperkalemia  Near syncope     Rx / DC Orders   ED Discharge Orders     None        Note:  This document was prepared using Dragon voice recognition software and may include unintentional dictation errors.    Arta Silence, MD 04/17/22 1546

## 2022-04-17 NOTE — ED Notes (Signed)
MD Blaine Hamper at bedside when [employer] called to say no unusual activity seen on pt's pm interrogation.

## 2022-04-17 NOTE — ED Notes (Signed)
Informed RN bed assigned 

## 2022-04-17 NOTE — ED Triage Notes (Addendum)
Pt arrived via EMS who was called for pt weakness. He had positive orthostatic vitals. States n/v for the last "three or four days"

## 2022-04-17 NOTE — H&P (Signed)
History and Physical    Matthew Zamora HAL:[id number] DOB: 1941-05-27 DOA: 04/17/2022  Referring MD/NP/PA:   PCP: [place] Pink, MD   Patient coming from:  The patient is coming from home.    Chief Complaint: Lightheadedness  HPI: Matthew Zamora is a 81 y.o. male with medical history significant of hypertension, hyperlipidemia, COPD, APF on Eliquis, CHF with EF of 35-40%, CAD, anemia, CKD-3B, frequent fall, who presents with lightheadedness.  Patient states that he has lightheadedness and dizziness for almost a week.  He has generalized weakness.  He feels that the room is spinning around him sometimes. No unilateral numbness or tinglings in extremities, no facial droop or slurred speech.  No fall or any injury.  He feels like he is going to pass out, but did not.  He has intermittent nausea, vomiting with nonbilious nonbloody vomiting during the same period of time.  He states that he vomits several times each day.  Denies abdominal pain or diarrhea.  No fever or chills.  Patient does not have chest pain, cough, shortness breath.  No palpitation.  Data reviewed independently and ED Course: pt was found to have WBC 9.1, BMP 354, troponin level 11, lactic acid 1.1, negative COVID PCR, stable renal function, potassium 5.5, abnormal liver function (ALP 86, AST 129, ALT 236, total bilirubin 1.4).  Temperature normal, blood pressure 123/63, heart rate 70, RR 14, oxygen saturation 98% on room air.  Patient is placed on telemetry bed for position.   EKG: I have personally reviewed.  Seem to be paced rhythm though not very clear pacing mark, QTc 508.   Review of Systems:   General: no fevers, chills, no body weight gain, has poor appetite, has fatigue HEENT: no blurry vision, hearing changes or sore throat Respiratory: no dyspnea, coughing, wheezing CV: no chest pain, no palpitations GI: has nausea, vomiting, no abdominal pain, diarrhea, constipation GU: no dysuria, burning on  urination, increased urinary frequency, hematuria  Ext: no leg edema Neuro: no unilateral weakness, numbness, or tingling, no vision change or hearing loss.  Has lightheadedness and dizziness Skin: no rash, no skin tear. MSK: No muscle spasm, no deformity, no limitation of range of movement in spin Heme: No easy bruising.  Travel history: No recent long distant travel.   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 pylori infection    History of hepatitis    Hyperlipidemia    Hypertension    Liver cyst 05/16/15   PAF (paroxysmal atrial fibrillation) ([place])    Prostate CA ([place])     Past Surgical History:  Procedure Laterality Date   CATARACT EXTRACTION     COLONOSCOPY  10/06/2014, 09/18/2004, 03/14/2009   ESOPHAGOGASTRODUODENOSCOPY  10/06/2014, 03/18/2001, 03/14/2009   ESOPHAGOGASTRODUODENOSCOPY (EGD) WITH PROPOFOL N/A 05/07/2018   Procedure: ESOPHAGOGASTRODUODENOSCOPY (EGD) WITH PROPOFOL;  Surgeon: Toledo, [name], MD;  Location: [facility] ENDOSCOPY;  Service: Gastroenterology;  Laterality: N/A;   FLEXIBLE  SIGMOIDOSCOPY  08/26/1990   INSERTION OF ICD     PROSTATE SURGERY     TONSILLECTOMY      Social History:  reports that he quit smoking about 22 years ago. His smoking use included cigarettes. He has a 38.00 pack-year smoking history. He has never used smokeless tobacco. He reports that he does not currently use drugs. He reports that he does not drink  alcohol.  Family History:  Family History  Problem Relation Age of Onset   Heart attack Mother    Heart attack Father      Prior to Admission medications   Medication Sig Start Date End Date Taking? Authorizing Provider  acetaminophen (TYLENOL) 325 MG tablet Take 2 tablets (650 mg total) by mouth every 4 (four) hours as needed for headache or mild pain. 10/29/19   Bradly Bienenstock, NP  albuterol (PROVENTIL) (2.5 MG/3ML) 0.083% nebulizer solution Inhale 3 mLs (2.5 mg total) into the lungs as needed for shortness of breath. 10/29/19   Bradly Bienenstock, NP  albuterol (VENTOLIN HFA) 108 (90 Base) MCG/ACT inhaler Inhale 2 puffs into the lungs every 4 (four) hours as needed. 04/21/21   [provider]  amiodarone (PACERONE) 400 MG tablet Take 1 tablet (400 mg total) by mouth daily. 12/16/19   Pokhrel, Corrie Mckusick, MD  ascorbic acid (VITAMIN C) 500 MG tablet Take 1 tablet by mouth daily.    [provider]  aspirin 81 MG chewable tablet Chew 81 mg by mouth daily at 12 noon. 03/30/21   [provider]  atorvastatin (LIPITOR) 80 MG tablet Take 80 mg by mouth daily. 05/13/21   [provider]  benzonatate (TESSALON PERLES) 100 MG capsule Take 1 capsule (100 mg total) by mouth every 6 (six) hours as needed for cough. 06/16/21 06/16/22  Lavina Hamman, MD  ELIQUIS 2.5 MG TABS tablet Take 2.5 mg by mouth 2 (two) times daily. 12/28/21   [provider]  gabapentin (NEURONTIN) 100 MG capsule Take 100 mg by mouth at bedtime. 12/09/19   [provider]  magnesium oxide (MAG-OX) 400 (240 Mg) MG tablet Take 1 tablet by mouth daily. 02/05/21   [provider]  melatonin 3 MG TABS tablet Take 9 mg by mouth at bedtime.    [provider]  metoprolol succinate (TOPROL-XL) 25 MG 24 hr tablet Take 1 tablet (25 mg total) by mouth 2 (two) times daily. Take with or immediately following a meal. 02/12/22   Emeterio Reeve, DO  mexiletine (MEXITIL) 150 MG  capsule Take 150 mg by mouth 2 (two) times daily. 12/01/19   [provider]  Multiple Vitamin (MULTIVITAMIN WITH MINERALS) TABS tablet Take 1 tablet by mouth daily. 10/29/19   Bradly Bienenstock, NP  Nebulizer MISC 1 each by Does not apply route as needed. 06/16/21   Lavina Hamman, MD  omeprazole (PRILOSEC) 20 MG capsule Take 20 mg by mouth every morning. 05/18/21   [provider]  oxymetazoline (AFRIN) 0.05 % nasal spray Place 2 sprays into both nostrils 2 (two) times daily as needed for congestion (nose bleeds). 07/13/21 07/13/22  Naaman Plummer, MD  tadalafil (CIALIS) 20 MG tablet Take 20 mg by mouth daily. 09/04/21   [provider]  tamsulosin (FLOMAX) 0.4 MG CAPS capsule Take 0.4 mg by mouth daily. 12/09/19   [provider]  torsemide (DEMADEX) 20 MG tablet Take 1 tablet (20 mg total) by mouth daily as needed. for up to 3 days for increased  leg swelling, shortness of breath, weight gain 5+ lbs over 1-2 days. Seek medical care if these symptoms are not improving with increased dose. 02/12/22 03/14/22  Emeterio Reeve, DO    Physical Exam: Vitals:   04/17/22 1008 04/17/22 1030 04/17/22 1100 04/17/22 1130  BP: 126/68 121/66 115/66 123/63  Pulse:  62 62 60  Resp:  '16 17 14  '$ Temp:      TempSrc:      SpO2:  100% 98% 99%   General: Not in acute distress.  Dry mucosal membranes HEENT:       Eyes: PERRL, EOMI, no scleral icterus.       ENT: No discharge from the ears and nose, no pharynx injection, no tonsillar enlargement.        Neck: No JVD, no bruit, no mass felt. Heme: No neck lymph node enlargement. Cardiac: S1/S2, RRR, No murmurs, No gallops or rubs. Respiratory: No rales, wheezing, rhonchi or rubs. GI: Soft, nondistended, nontender, no rebound pain, no organomegaly, BS present. GU: No hematuria Ext: No pitting leg edema bilaterally. 1+DP/PT pulse bilaterally. Musculoskeletal: No joint deformities, No joint redness or warmth, no limitation of ROM  in spin. Skin: No rashes.  Neuro: Alert, oriented X3, cranial nerves II-XII grossly intact, moves all extremities normally. Labs on Admission: I have personally reviewed following labs and imaging studies  CBC: Recent Labs  Lab 04/10/22 1447 04/17/22 1105  WBC 5.6 9.1  HGB 10.8* 10.7*  HCT 33.8* 32.7*  MCV 92.3 90.6  PLT 168 502*   Basic Metabolic Panel: Recent Labs  Lab 04/10/22 1447 04/10/22 1531 04/17/22 1029  NA 135  --  132*  K 3.8  --  5.5*  CL 111  --  100  CO2 21*  --  26  GLUCOSE 77  --  95  BUN 13  --  15  CREATININE 1.76*  --  1.83*  CALCIUM 7.6*  --  9.2  MG  --  1.9  --    GFR: CrCl cannot be calculated (Unknown ideal weight.). Liver Function Tests: Recent Labs  Lab 04/17/22 1029  AST 129*  ALT 136*  ALKPHOS 86  BILITOT 1.4*  PROT 6.4*  ALBUMIN 3.6   Recent Labs  Lab 04/17/22 1029  LIPASE 27   No results for input(s): "AMMONIA" in the last 168 hours. Coagulation Profile: No results for input(s): "INR", "PROTIME" in the last 168 hours. Cardiac Enzymes: No results for input(s): "CKTOTAL", "CKMB", "CKMBINDEX", "TROPONINI" in the last 168 hours. BNP (last 3 results) No results for input(s): "PROBNP" in the last 8760 hours. HbA1C: No results for input(s): "HGBA1C" in the last 72 hours. CBG: No results for input(s): "GLUCAP" in the last 168 hours. Lipid Profile: No results for input(s): "CHOL", "HDL", "LDLCALC", "TRIG", "CHOLHDL", "LDLDIRECT" in the last 72 hours. Thyroid Function Tests: No results for input(s): "TSH", "T4TOTAL", "FREET4", "T3FREE", "THYROIDAB" in the last 72 hours. Anemia Panel: No results for input(s): "VITAMINB12", "FOLATE", "FERRITIN", "TIBC", "IRON", "RETICCTPCT" in the last 72 hours. Urine analysis:    Component Value Date/Time   COLORURINE YELLOW (A) 03/27/2022 0350   APPEARANCEUR CLEAR (A) 03/27/2022 0350   LABSPEC 1.028 03/27/2022 0350   PHURINE 7.0 03/27/2022 0350   GLUCOSEU NEGATIVE 03/27/2022 0350   HGBUR  NEGATIVE 03/27/2022 0350   BILIRUBINUR NEGATIVE 03/27/2022 0350   KETONESUR NEGATIVE 03/27/2022 0350   PROTEINUR NEGATIVE 03/27/2022 0350   NITRITE NEGATIVE 03/27/2022 0350   LEUKOCYTESUR TRACE (A) 03/27/2022 0350   Sepsis Labs: '@LABRCNTIP'$ (procalcitonin:4,lacticidven:4) )  Recent Results (from the past 240 hour(s))  Resp Panel by RT-PCR (Flu A&B, Covid) Anterior Nasal Swab     Status: None   Collection Time: 04/10/22  3:31 PM   Specimen: Anterior Nasal Swab  Result Value Ref Range Status   SARS Coronavirus 2 by RT PCR NEGATIVE NEGATIVE Final    Comment: (NOTE) SARS-CoV-2 target nucleic acids are NOT DETECTED.  The SARS-CoV-2 RNA is generally detectable in upper respiratory specimens during the acute phase of infection. The lowest concentration of SARS-CoV-2 viral copies this assay can detect is 138 copies/mL. A negative result does not preclude SARS-Cov-2 infection and should not be used as the sole basis for treatment or other patient management decisions. A negative result may occur with  improper specimen collection/handling, submission of specimen other than nasopharyngeal swab, presence of viral mutation(s) within the areas targeted by this assay, and inadequate number of viral copies(<138 copies/mL). A negative result must be combined with clinical observations, patient history, and epidemiological information. The expected result is Negative.  Fact Sheet for Patients:  EntrepreneurPulse.com.au  Fact Sheet for Healthcare Providers:  IncredibleEmployment.be  This test is no t yet approved or cleared by the [place] FDA and  has been authorized for detection and/or diagnosis of SARS-CoV-2 by FDA under an Emergency Use Authorization (EUA). This EUA will remain  in effect (meaning this test can be used) for the duration of the COVID-19 declaration under Section 564(b)(1) of the Act, 21 U.S.C.section 360bbb-3(b)(1), unless the  authorization is terminated  or revoked sooner.       Influenza A by PCR NEGATIVE NEGATIVE Final   Influenza B by PCR NEGATIVE NEGATIVE Final    Comment: (NOTE) The Xpert Xpress SARS-CoV-2/FLU/RSV plus assay is intended as an aid in the diagnosis of influenza from Nasopharyngeal swab specimens and should not be used as a sole basis for treatment. Nasal washings and aspirates are unacceptable for Xpert Xpress SARS-CoV-2/FLU/RSV testing.  Fact Sheet for Patients: EntrepreneurPulse.com.au  Fact Sheet for Healthcare Providers: IncredibleEmployment.be  This test is not yet approved or cleared by the [place] FDA and has been authorized for detection and/or diagnosis of SARS-CoV-2 by FDA under an Emergency Use Authorization (EUA). This EUA will remain in effect (meaning this test can be used) for the duration of the COVID-19 declaration under Section 564(b)(1) of the Act, 21 U.S.C. section 360bbb-3(b)(1), unless the authorization is terminated or revoked.  Performed at [facility], [address] [place], [place] [address]   Resp Panel by RT-PCR (Flu A&B, Covid) Anterior Nasal Swab     Status: None   Collection Time: 04/17/22 11:05 AM   Specimen: Anterior Nasal Swab  Result Value Ref Range Status   SARS Coronavirus 2 by RT PCR NEGATIVE NEGATIVE Final    Comment: (NOTE) SARS-CoV-2 target nucleic acids are NOT DETECTED.  The SARS-CoV-2 RNA is generally detectable in upper respiratory specimens during the acute phase of infection. The lowest concentration of SARS-CoV-2 viral copies this assay can detect is 138 copies/mL. A negative result does not preclude SARS-Cov-2 infection and should not be used as the sole basis for treatment or other patient management decisions. A negative result may occur with  improper specimen collection/handling, submission of specimen other than nasopharyngeal swab, presence of viral mutation(s)  within the areas targeted by this assay, and inadequate number of viral copies(<138 copies/mL). A negative result must be combined with clinical observations, patient history, and epidemiological information. The expected result is Negative.  Fact Sheet  for Patients:  EntrepreneurPulse.com.au  Fact Sheet for Healthcare Providers:  IncredibleEmployment.be  This test is no t yet approved or cleared by the [place] FDA and  has been authorized for detection and/or diagnosis of SARS-CoV-2 by FDA under an Emergency Use Authorization (EUA). This EUA will remain  in effect (meaning this test can be used) for the duration of the COVID-19 declaration under Section 564(b)(1) of the Act, 21 U.S.C.section 360bbb-3(b)(1), unless the authorization is terminated  or revoked sooner.       Influenza A by PCR NEGATIVE NEGATIVE Final   Influenza B by PCR NEGATIVE NEGATIVE Final    Comment: (NOTE) The Xpert Xpress SARS-CoV-2/FLU/RSV plus assay is intended as an aid in the diagnosis of influenza from Nasopharyngeal swab specimens and should not be used as a sole basis for treatment. Nasal washings and aspirates are unacceptable for Xpert Xpress SARS-CoV-2/FLU/RSV testing.  Fact Sheet for Patients: EntrepreneurPulse.com.au  Fact Sheet for Healthcare Providers: IncredibleEmployment.be  This test is not yet approved or cleared by the [place] FDA and has been authorized for detection and/or diagnosis of SARS-CoV-2 by FDA under an Emergency Use Authorization (EUA). This EUA will remain in effect (meaning this test can be used) for the duration of the COVID-19 declaration under Section 564(b)(1) of the Act, 21 U.S.C. section 360bbb-3(b)(1), unless the authorization is terminated or revoked.  Performed at [facility], [address] [place], [place] [address]      Radiological Exams on Admission: CT  Head Wo Contrast  Result Date: 04/17/2022 CLINICAL DATA:  Syncope EXAM: CT HEAD WITHOUT CONTRAST TECHNIQUE: Contiguous axial images were obtained from the base of the skull through the vertex without intravenous contrast. RADIATION DOSE REDUCTION: This exam was performed according to the departmental dose-optimization program which includes automated exposure control, adjustment of the mA and/or kV according to patient size and/or use of iterative reconstruction technique. COMPARISON:  CT head 03/27/2022 FINDINGS: Brain: No evidence of acute infarction, hemorrhage, hydrocephalus, extra-axial collection or mass lesion/mass effect. Mild white matter hypodensity consistent with chronic microvascular ischemia Vascular: Negative for hyperdense vessel Skull: Negative Sinuses/Orbits: Paranasal sinuses clear. Bilateral cataract extraction Other: None IMPRESSION: No acute abnormality no interval change. Electronically Signed   By: Franchot Gallo M.D.   On: 04/17/2022 11:22      Assessment/Plan Principal Problem:   Near syncope Active Problems:   Hyperkalemia   Paroxysmal atrial fibrillation ([name])   Abnormal LFTs   Chronic systolic CHF (congestive heart failure) ([name])   Essential hypertension   Hyperlipidemia   CAD (coronary artery disease)   CKD (chronic kidney disease), stage IIIb   COPD (chronic obstructive pulmonary disease) ([name])   Normocytic anemia   Assessment and Plan:  Near syncope: Patient reports lightheadedness and dizziness.  Etiology is not clear.  No focal neurodeficit on physical examination.  CT head negative.  Patient cannot do MRI for brain due to presence of pacemaker.  Potential differential diagnosis is dehydration and volume depletion due to nausea vomiting. Per RN report, there is no acute event per pacemaker interrogation report.  -will please telemetry bed for position -Check orthostatic vital sign -IV fluid: 500 cc normal saline -Hold torsemide -For  precaution -PT/OT  Hyperkalemia -Patient was treated with D50, 6 units of NovoLog, 1 g calcium gluconate in ED -Will give 10 g of leukoma  Paroxysmal atrial fibrillation [facility]): Heart rate 70. -Amiodarone, metoprolol, mexiletine -Eliquis  Abnormal LFTs: Patient does not have abdominal pain.  No fever or chills.  Patient reports nausea vomiting. -Avoid using Tylenol -hold Lipitor -Check hepatitis panel -Follow-up ultrasound of right upper quadrant  Chronic systolic CHF (congestive heart failure) ([place]): 2D echo on 08/10/2021 showed EF 35-40% with grade 2 diastolic dysfunction.  Patient does not have leg edema JVD.  Does not seem to have CHF exacerbation. -Hold diuretics, torsemide -Check BNP --> 354  Essential hypertension -IV hydralazine as needed -Continue home metoprolol  Hyperlipidemia -hold Lipitor due to abnormal LFT  CAD (coronary artery disease): No chest pain.  Troponin level 11. -Continue aspirin, Lipitor  CKD (chronic kidney disease), stage IIIb: Renal function is close to baseline.  Baseline creatinine 1.7-1.8 recently.  His creatinine is 1.83, BUN 15, GFR 37 -Follow-up with BMP  COPD (chronic obstructive pulmonary disease) ([place]): Stable -Bronchodilators  Normocytic anemia: Hemoglobin stable 10.7 (10.8 on 04/10/2022) -Follow-up with CBC       DVT ppx: on Eliquis  Code Status: Full code per renal.  Family Communication: I have tried to call his son who did not pick up the phone, I left a message  Disposition Plan:  Anticipate discharge back to previous environment  Consults called:  none  Admission status and Level of care: Telemetry Medical: for obs      Dispo: The patient is from: Home              Anticipated d/c is to: Home              Anticipated d/c date is: 1 day              Patient currently is not medically stable to d/c.    Severity of Illness:  The appropriate patient status for this patient is OBSERVATION. Observation status is  judged to be reasonable and necessary in order to provide the required intensity of service to ensure the patient's safety. The patient's presenting symptoms, physical exam findings, and initial radiographic and laboratory data in the context of their medical condition is felt to place them at decreased risk for further clinical deterioration. Furthermore, it is anticipated that the patient will be medically stable for discharge from the hospital within 2 midnights of admission.        Date of Service 04/17/2022    Ivor Costa [facility]   If 7PM-7AM, please contact night-coverage [email or web address] 04/17/2022, 1:47 PM

## 2022-04-17 NOTE — ED Notes (Signed)
Pt to ct 

## 2022-04-18 DIAGNOSIS — I951 Orthostatic hypotension: Secondary | ICD-10-CM

## 2022-04-18 DIAGNOSIS — A040 Enteropathogenic Escherichia coli infection: Secondary | ICD-10-CM | POA: Diagnosis not present

## 2022-04-18 DIAGNOSIS — K2210 Ulcer of esophagus without bleeding: Secondary | ICD-10-CM | POA: Diagnosis present

## 2022-04-18 DIAGNOSIS — R627 Adult failure to thrive: Secondary | ICD-10-CM | POA: Diagnosis present

## 2022-04-18 DIAGNOSIS — E785 Hyperlipidemia, unspecified: Secondary | ICD-10-CM | POA: Diagnosis present

## 2022-04-18 DIAGNOSIS — I4729 Other ventricular tachycardia: Secondary | ICD-10-CM | POA: Diagnosis not present

## 2022-04-18 DIAGNOSIS — I130 Hypertensive heart and chronic kidney disease with heart failure and stage 1 through stage 4 chronic kidney disease, or unspecified chronic kidney disease: Secondary | ICD-10-CM | POA: Diagnosis present

## 2022-04-18 DIAGNOSIS — I5043 Acute on chronic combined systolic (congestive) and diastolic (congestive) heart failure: Secondary | ICD-10-CM | POA: Diagnosis not present

## 2022-04-18 DIAGNOSIS — J9601 Acute respiratory failure with hypoxia: Secondary | ICD-10-CM | POA: Diagnosis not present

## 2022-04-18 DIAGNOSIS — R0601 Orthopnea: Secondary | ICD-10-CM | POA: Diagnosis not present

## 2022-04-18 DIAGNOSIS — I420 Dilated cardiomyopathy: Secondary | ICD-10-CM | POA: Diagnosis present

## 2022-04-18 DIAGNOSIS — R7401 Elevation of levels of liver transaminase levels: Secondary | ICD-10-CM | POA: Diagnosis not present

## 2022-04-18 DIAGNOSIS — E871 Hypo-osmolality and hyponatremia: Secondary | ICD-10-CM | POA: Diagnosis present

## 2022-04-18 DIAGNOSIS — D631 Anemia in chronic kidney disease: Secondary | ICD-10-CM | POA: Diagnosis present

## 2022-04-18 DIAGNOSIS — R42 Dizziness and giddiness: Secondary | ICD-10-CM

## 2022-04-18 DIAGNOSIS — E861 Hypovolemia: Secondary | ICD-10-CM | POA: Diagnosis present

## 2022-04-18 DIAGNOSIS — Z681 Body mass index (BMI) 19 or less, adult: Secondary | ICD-10-CM | POA: Diagnosis not present

## 2022-04-18 DIAGNOSIS — N179 Acute kidney failure, unspecified: Secondary | ICD-10-CM | POA: Diagnosis not present

## 2022-04-18 DIAGNOSIS — J440 Chronic obstructive pulmonary disease with acute lower respiratory infection: Secondary | ICD-10-CM | POA: Diagnosis not present

## 2022-04-18 DIAGNOSIS — K7689 Other specified diseases of liver: Secondary | ICD-10-CM | POA: Diagnosis present

## 2022-04-18 DIAGNOSIS — E43 Unspecified severe protein-calorie malnutrition: Secondary | ICD-10-CM | POA: Diagnosis present

## 2022-04-18 DIAGNOSIS — R112 Nausea with vomiting, unspecified: Secondary | ICD-10-CM | POA: Diagnosis not present

## 2022-04-18 DIAGNOSIS — E875 Hyperkalemia: Secondary | ICD-10-CM | POA: Diagnosis present

## 2022-04-18 DIAGNOSIS — I480 Paroxysmal atrial fibrillation: Secondary | ICD-10-CM | POA: Diagnosis present

## 2022-04-18 DIAGNOSIS — R55 Syncope and collapse: Secondary | ICD-10-CM | POA: Diagnosis present

## 2022-04-18 DIAGNOSIS — E860 Dehydration: Secondary | ICD-10-CM | POA: Diagnosis present

## 2022-04-18 DIAGNOSIS — I714 Abdominal aortic aneurysm, without rupture, unspecified: Secondary | ICD-10-CM | POA: Diagnosis present

## 2022-04-18 DIAGNOSIS — J189 Pneumonia, unspecified organism: Secondary | ICD-10-CM | POA: Diagnosis not present

## 2022-04-18 DIAGNOSIS — R197 Diarrhea, unspecified: Secondary | ICD-10-CM | POA: Diagnosis not present

## 2022-04-18 DIAGNOSIS — R7989 Other specified abnormal findings of blood chemistry: Secondary | ICD-10-CM | POA: Diagnosis not present

## 2022-04-18 DIAGNOSIS — R531 Weakness: Secondary | ICD-10-CM | POA: Diagnosis not present

## 2022-04-18 DIAGNOSIS — I5022 Chronic systolic (congestive) heart failure: Secondary | ICD-10-CM | POA: Diagnosis not present

## 2022-04-18 DIAGNOSIS — Z1152 Encounter for screening for COVID-19: Secondary | ICD-10-CM | POA: Diagnosis not present

## 2022-04-18 DIAGNOSIS — N1832 Chronic kidney disease, stage 3b: Secondary | ICD-10-CM | POA: Diagnosis present

## 2022-04-18 DIAGNOSIS — I482 Chronic atrial fibrillation, unspecified: Secondary | ICD-10-CM | POA: Diagnosis present

## 2022-04-18 DIAGNOSIS — Z7189 Other specified counseling: Secondary | ICD-10-CM | POA: Diagnosis not present

## 2022-04-18 LAB — BASIC METABOLIC PANEL
Anion gap: 6 (ref 5–15)
BUN: 22 mg/dL (ref 8–23)
CO2: 24 mmol/L (ref 22–32)
Calcium: 8.7 mg/dL — ABNORMAL LOW (ref 8.9–10.3)
Chloride: 104 mmol/L (ref 98–111)
Creatinine, Ser: 2.04 mg/dL — ABNORMAL HIGH (ref 0.61–1.24)
GFR, Estimated: 32 mL/min — ABNORMAL LOW (ref >60)
Glucose, Bld: 124 mg/dL — ABNORMAL HIGH (ref 70–99)
Potassium: 3.8 mmol/L (ref 3.5–5.1)
Sodium: 134 mmol/L — ABNORMAL LOW (ref 135–145)

## 2022-04-18 LAB — HEPATIC FUNCTION PANEL
ALT: 151 U/L — ABNORMAL HIGH (ref 0–44)
AST: 120 U/L — ABNORMAL HIGH (ref 15–41)
Albumin: 3.1 g/dL — ABNORMAL LOW (ref 3.5–5.0)
Alkaline Phosphatase: 73 U/L (ref 38–126)
Bilirubin, Direct: 0.2 mg/dL (ref 0.0–0.2)
Indirect Bilirubin: 0.3 mg/dL (ref 0.3–0.9)
Total Bilirubin: 0.5 mg/dL (ref 0.3–1.2)
Total Protein: 5.4 g/dL — ABNORMAL LOW (ref 6.5–8.1)

## 2022-04-18 LAB — MAGNESIUM: Magnesium: 2 mg/dL (ref 1.7–2.4)

## 2022-04-18 MED ORDER — ACETAMINOPHEN 325 MG PO TABS
650.0000 mg | ORAL_TABLET | Freq: Four times a day (QID) | ORAL | Status: DC | PRN
  Administered 2022-04-18 – 2022-05-11 (×25): 650 mg via ORAL
  Filled 2022-04-18 (×25): qty 2

## 2022-04-18 MED ORDER — POLYETHYLENE GLYCOL 3350 17 G PO PACK
17.0000 g | PACK | Freq: Every day | ORAL | Status: DC
  Administered 2022-04-18 – 2022-04-26 (×6): 17 g via ORAL
  Filled 2022-04-18 (×8): qty 1

## 2022-04-18 MED ORDER — SODIUM CHLORIDE 0.9 % IV SOLN: INTRAVENOUS | Status: DC

## 2022-04-18 NOTE — Assessment & Plan Note (Addendum)
On admission AST 129, ALT 136, total bili 1.4. RUQ ultrasound showed normal gallbladder and CBD, stable complex cyst in the right lobe of liver.  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   Acute hepatitis panel negative. -- Monitor LFTs daily --GI consulted, signed off AST/ALT improving

## 2022-04-18 NOTE — Plan of Care (Signed)

## 2022-04-18 NOTE — Progress Notes (Signed)
       CROSS COVER NOTE  NAME: TETSUO COPPOLA MRN: [id number] DOB : 1941-03-27 ATTENDING PHYSICIAN: Ivor Costa, MD    Date of Service   04/18/2022   HPI/Events of Note   Mr Herms reports he has not had a bowel movement in over a week and is now having 4/10 abdominal aching/discomfort in bilateral lower quadrants. Non tender to palpation, abdomen not distended, no masses felt on palpation, (+) guarding. Recent CT abdomen 03/27/22 notes retained stool. Will trial enema and consider imaging if discomfort persists.  Interventions   Assessment/Plan:  Constipation Enema --> small liquid output, abdominal pain resolved Daily miralax Consider abdominal imaging      This document was prepared using Dragon voice recognition software and may include unintentional dictation errors.  Neomia Glass DNP, MBA, FNP-BC Nurse Practitioner [facility] Pager [phone]

## 2022-04-18 NOTE — Assessment & Plan Note (Addendum)
Baseline creatinine 1.7-1.8.  Renal function on admission was at baseline. Creatinine trended up in setting of dehydration with intractable N/V, low BP's.   Cr stable, near baseline --Treated with IV fluids --Daily BMP to monitor --Avoid nephrotoxins, hypotension --Renally dose meds.

## 2022-04-18 NOTE — Assessment & Plan Note (Addendum)
Lipitor on hold due to elevated LFTs.

## 2022-04-18 NOTE — Progress Notes (Signed)
Progress Note   Patient: Matthew Zamora ALP:[id number] DOB: Jun 18, 1940 DOA: 04/17/2022     0 DOS: the patient was seen and examined on 04/18/2022   Brief hospital course: ABRIEL GEESEY is a 81 y.o. male with medical history significant of hypertension, hyperlipidemia, COPD, APF on Eliquis, CHF with EF of 35-40%, CAD, anemia, CKD-3B, frequent fall, who presented on 04/17/2022 for evaluation of dizziness and lightheadedness, generalized weakness, near syncopal episodes.  This occurred in the setting of about one week of GI illness with nausea and vomiting.  In the ED, afebrile and stable vitals.   Labs notable for K 5.5, no leukocytosis, normal lactic acid x 2, AST 129, ALT 136, Tbili 1.4.  BNP mildly elevated 354.5, troponin x 2 normal, negative for Covid-19 and flu A/B.  Admitted to medicine service for further evaluation and management.    Pt later reported severe constipation at home, with associated lower abdominal pain. Improved after enema and having BM's.  11/2: pt with positive orthostatic vitals with severe symptoms of dizziness, unable to tolerate being upright.  Remains with poor PO intake, started on IV fluids.     Assessment and Plan: * Near syncope Most likely due to dehydration in the setting of 1 week nausea vomiting.  Orthostatic vitals positive.  Patient appears dry on exam as well. -- Start IV fluids --Daily orthostatic vitals --Monitor on telemetry --Supportive care for GI illness as outlined   Nausea & vomiting Patient reported 1 week history of nausea vomiting at home, leading to dehydration and near syncopal episodes with dizziness lightheadedness. -- IV fluids --IV antiemetics as needed --Monitor renal function and electrolytes --Advance diet as tolerated  Orthostatic hypotension With PT today, patient was extremely dizzy and lightheaded.  Supine BP 109/80, seated BP 75/50 (repeat 80/63), could not tolerating standing position. --IV fluids --Daily  orthostatic vitals --Fall precautions  Abnormal LFTs On admission AST 129, ALT 136, total bili 1.4. RUQ ultrasound showed normal gallbladder and CBD, stable complex cyst in the right lobe of liver.  Suspect due to acute GI illness. -- Monitor LFTs  Hyperkalemia K was 5.5 on admission, now normalized.   CKD (chronic kidney disease), stage IIIb Baseline creatinine 1.7-1.8.  Renal function on admission was at baseline. Creatinine up slightly today 2.04, suspect due to dehydration given GI illness and orthostatic hypotension. -- Started IV fluids --Daily BMP to monitor --Avoid nephrotoxins, hypotension --Renally dose meds  Chronic systolic CHF (congestive heart failure) ([name]) Currently hypovolemic and dehydrated. Diuretics on hold. Monitor volume status closely while on IV fluids.  Paroxysmal atrial fibrillation ([name]) Rate controlled. Eliquis, amiodarone, metoprolol, Mexitil  Essential hypertension Continue metoprolol Diuretic on hold IV hydralazine as needed   Hyperlipidemia Lipitor on hold due to elevated LFTs  CAD (coronary artery disease) Stable, no active chest pain. Continue aspirin, metoprolol. Lipitor on hold due to elevated LFTs.  COPD (chronic obstructive pulmonary disease) ([name]) Stable without wheezing. Continue bronchodilators  Normocytic anemia Hemoglobin stable 10.8, 10.7. Monitor CBC        Subjective: Patient was asleep resting in bed, woke easily to voice when seen on rounds.  This morning, bedside RN reported patient having a headache and requested Tylenol.  He denies headache at this time.  He reports ongoing dizziness and lightheadedness position when working with therapy earlier this morning and possibly started vitals.  He is quite lethargic and easily falls back to sleep, does not answer further questions for me.  Physical Exam: Vitals:   04/17/22 2232  04/18/22 0157 04/18/22 0847 04/18/22 1206  BP:  115/61 119/61 (!) 97/48  Pulse: (!)  58 (!) 102 63 (!) 57  Resp:  '17 19 19  '$ Temp:  98.7 F (37.1 C) 98.6 F (37 C) 98.7 F (37.1 C)  TempSrc:      SpO2:  100% 99% 96%  Weight:      Height:       General exam: Sleeping awakes to voice but quickly falls back to sleep, no acute distress HEENT: dry mucus membranes, hearing grossly normal  Respiratory system: CTAB, no wheezes, rales or rhonchi, normal respiratory effort. Cardiovascular system: normal S1/S2, RRR, no JVD, no peripheral edema.   Gastrointestinal system: soft, NT, ND, no HSM felt, +bowel sounds. Central nervous system: no gross focal neurologic deficits, normal speech Extremities: moves all, no edema, normal tone Skin: Poor skin turgor dry, intact, normal temperature Psychiatry: normal mood, flat affect, judgement and insight appear normal    Data Reviewed:  Notable labs --- sodium 134, glucose 124, creatinine up slightly to 2.0.4 from a 1.83, calcium 8.7, albumin 3.1, AST 120 from 129, ALT 151 from 136, total bili normalized 0.5 from 1.4    Right upper quadrant ultrasound with normal gallbladder and common bile duct, stable right hepatic lobe complex cyst   Family Communication: None at bedside, will attempt to call  Disposition: Status is: Inpatient Remains inpatient appropriate because: severely orthostatic dizziness, dehydrated, on IV fluids, not tolerating adequate PO intake, ongoing evaluation      Planned Discharge Destination: Home    Time spent: 45 minutes  Author: Ezekiel Slocumb, DO 04/18/2022 1:03 PM  For on call review [email or web address].

## 2022-04-18 NOTE — Assessment & Plan Note (Addendum)
Rate controlled. --Holding amiodarone, Mexitil after EP discussion as outlined. --Oral metoprolol stopped due to low BP's --On Eliquis --Monitor on telemetry --Lopressor IV PRN for HR sustained above 110.  Cardiology following

## 2022-04-18 NOTE — Evaluation (Signed)
Physical Therapy Evaluation Patient Details Name: Matthew Zamora MRN: [id number] DOB: 10/20/1940 Today's Date: 04/18/2022  History of Present Illness  "HPI: BRONISLAW SWITZER is a 81 y.o. male with medical history significant of hypertension, hyperlipidemia, COPD, APF on Eliquis, CHF with EF of 35-40%, CAD, anemia, CKD-3B, frequent fall, who presents with lightheadedness."  Clinical Impression  Patient reports living at home alone in a 1 level house with a ramp. His daughter in law checks on him 2x/day and provides assistance with transportation and medication management. Patient reports feeling "weaker" over the last week, and has been mainly spending his day in a recliner with a bedside commode within reach. He reports he was receiving intermittent HHPT.   Today, patient complained of abdominal discomfort throughout session, patient noted to be soiled upon entering room. Patient required supervision for supine to sit transfer. Of note, patient with (+) orthostatics and was symptomatic. Further mobility was deferred secondary to patient symptoms. At this time, recommending D/C to SNF with PT, however, PT will continue to follow and reassess D/C disposition while in house.  Orthostatics: Supine: 109/80 Seated: 75/50, retaken 80/63 Seated post exercise: 83/55 Return to supine: 117/83     Recommendations for follow up therapy are one component of a multi-disciplinary discharge planning process, led by the attending physician.  Recommendations may be updated based on patient status, additional functional criteria and insurance authorization.  Follow Up Recommendations Skilled nursing-short term rehab (<3 hours/day) Can patient physically be transported by private vehicle: Yes    Assistance Recommended at Discharge Frequent or constant Supervision/Assistance  Patient can return home with the following  A little help with walking and/or transfers;A little help with  bathing/dressing/bathroom;Assist for transportation;Help with stairs or ramp for entrance    Equipment Recommendations  (patient has all needed DME at home)  Recommendations for Other Services       Functional Status Assessment       Precautions / Restrictions Precautions Precautions: Fall (orthostatic hypotension; contact isolation) Restrictions Weight Bearing Restrictions: No      Mobility  Bed Mobility Overal bed mobility: Needs Assistance Bed Mobility: Supine to Sit, Sit to Supine     Supine to sit: Supervision Sit to supine: Supervision   General bed mobility comments: cues for technique, bed rail use    Transfers                   General transfer comment: sit to stand deferred secondary to symptomatic (+) orthostatic hypotension    Ambulation/Gait Ambulation/Gait assistance:  (not assessed)                Stairs            Wheelchair Mobility    Modified Rankin (Stroke Patients Only)       Balance Overall balance assessment: History of Falls, Needs assistance Sitting-balance support: Feet supported, Bilateral upper extremity supported Sitting balance-Leahy Scale: Fair Sitting balance - Comments: increased postural sway in sitting, associated with hypotensive BP                                     Pertinent Vitals/Pain Pain Assessment Pain Assessment: 0-10 Pain Score: 5  Pain Location: abdomen Pain Descriptors / Indicators: Aching, Cramping    Home Living Family/patient expects to be discharged to:: Private residence Living Arrangements: Alone Available Help at Discharge: Family;Available PRN/intermittently Type of Home: Mobile home Home Access: Ramped  entrance       Home Layout: One level Home Equipment: Rollator (4 wheels);Rolling Walker (2 wheels);Cane - single point;Wheelchair - manual;Toilet riser Additional Comments:  (DIL manages medications, provides transportation and check on patient 2x/day)     Prior Function Prior Level of Function : Independent/Modified Independent             Mobility Comments:  (reports being modified independent with rollator, but recently unable to walk much/sleeping in recliner with BSC next to him)       Hand Dominance        Extremity/Trunk Assessment        Lower Extremity Assessment Lower Extremity Assessment: Generalized weakness (grossly 3+/5)    Cervical / Trunk Assessment Cervical / Trunk Assessment: Kyphotic  Communication   Communication: No difficulties  Cognition Arousal/Alertness: Awake/alert                                              General Comments General comments (skin integrity, edema, etc.):  (ecchymosis noted on BLE)    Exercises     Assessment/Plan    PT Assessment Patient needs continued PT services  PT Problem List Decreased strength;Decreased activity tolerance;Decreased balance;Decreased mobility;Pain       PT Treatment Interventions Functional mobility training;Balance training;Patient/family education;DME instruction;Gait training    PT Goals (Current goals can be found in the Care Plan section)  Acute Rehab PT Goals Patient Stated Goal: to return home PT Goal Formulation: With patient Time For Goal Achievement: 05/02/22 Potential to Achieve Goals: Fair    Frequency Min 2X/week     Co-evaluation               AM-PAC PT "6 Clicks" Mobility  Outcome Measure Help needed turning from your back to your side while in a flat bed without using bedrails?: None Help needed moving from lying on your back to sitting on the side of a flat bed without using bedrails?: None Help needed moving to and from a bed to a chair (including a wheelchair)?: A Little Help needed standing up from a chair using your arms (e.g., wheelchair or bedside chair)?: A Little Help needed to walk in hospital room?: A Little Help needed climbing 3-5 steps with a railing? : A Lot 6 Click Score:  19    End of Session   Activity Tolerance: Patient limited by fatigue;Patient limited by pain (abdominal discomfort, dizziness) Patient left: in bed;with call bell/phone within reach;with bed alarm set Nurse Communication: Mobility status;Other (comment) (vitals signs) PT Visit Diagnosis: Unsteadiness on feet (R26.81);Muscle weakness (generalized) (M62.81);History of falling (Z91.81)    Time: 7902-4097 PT Time Calculation (min) (ACUTE ONLY): 28 min   Charges:   PT Evaluation $PT Eval Moderate Complexity: 1 Mod PT Treatments $Therapeutic Activity: 8-22 mins        Clemetine Marker, PT, DPT, CCS, GCS   Derrell Lolling 04/18/2022, 11:23 AM

## 2022-04-18 NOTE — Evaluation (Signed)
Occupational Therapy Evaluation Patient Details Name: Matthew Zamora MRN: [id number] DOB: Dec 11, 1940 Today's Date: 04/18/2022   History of Present Illness Pt is an 81 year old male presenting to ED with lightheadedness; PMH significant for  hypertension, hyperlipidemia, COPD, APF on Eliquis, CHF with EF of 35-40%, CAD, anemia, CKD-3B, frequent fall   Clinical Impression   Chart reviewed, pt greeted in bed agreeable to OT evaluation. Pt is alert and oriented x4, increased time required for processing. PTA pt reports his daughter in law assists with IADLs, generally able to perform Adls indep but also endorses he has not been getting up without someone present in the house. Pt presents with deficits in strength, endurance, activity tolerance, balance all affecting safe and optimal ADL completion. Further mobility limited by symptomatic +orthostatics. MAX A required for toileting at bed level, MOD A required for UB dressing, MAX A required for LB dressing. Pt is performing bed mobility at a supervision level. At this time recommend discharge to STR to address functional deficits, potential to dc home with [facility] pending progress on floor, frequent/constant supervision.      Recommendations for follow up therapy are one component of a multi-disciplinary discharge planning process, led by the attending physician.  Recommendations may be updated based on patient status, additional functional criteria and insurance authorization.   Follow Up Recommendations  Skilled nursing-short term rehab (<3 hours/day)    Assistance Recommended at Discharge Frequent or constant Supervision/Assistance  Patient can return home with the following A lot of help with walking and/or transfers;A lot of help with bathing/dressing/bathroom    Functional Status Assessment  Patient has had a recent decline in their functional status and demonstrates the ability to make significant improvements in function in a reasonable  and predictable amount of time.  Equipment Recommendations  Wheelchair (measurements OT)    Recommendations for Other Services       Precautions / Restrictions Precautions Precautions: Fall Precaution Comments: orthostatic Restrictions Weight Bearing Restrictions: No      Mobility Bed Mobility Overal bed mobility: Needs Assistance Bed Mobility: Supine to Sit, Sit to Supine     Supine to sit: Supervision Sit to supine: Supervision   General bed mobility comments: pt immediately presents with orthostatic symptoms while seated at edge of bed    Transfers                          Balance Overall balance assessment: History of Falls, Needs assistance Sitting-balance support: Feet supported, Bilateral upper extremity supported Sitting balance-Leahy Scale: Fair                                     ADL either performed or assessed with clinical judgement   ADL Overall ADL's : Needs assistance/impaired Eating/Feeding: Set up;Sitting           Lower Body Bathing: Maximal assistance;Bed level   Upper Body Dressing : Moderate assistance;Sitting Upper Body Dressing Details (indicate cue type and reason): gown, doff shirt Lower Body Dressing: Maximal assistance     Toilet Transfer Details (indicate cue type and reason): unsafe to attempt on this date due to orthostatics Toileting- Clothing Manipulation and Hygiene: Maximal assistance;Bed level Toileting - Clothing Manipulation Details (indicate cue type and reason): after BM             Vision Patient Visual Report: No change from baseline  Perception     Praxis      Pertinent Vitals/Pain Pain Assessment Pain Assessment: Faces Faces Pain Scale: Hurts little more Pain Location: generalized Pain Descriptors / Indicators: Aching, Cramping Pain Intervention(s): Limited activity within patient's tolerance, Monitored during session, Repositioned     Hand Dominance      Extremity/Trunk Assessment Upper Extremity Assessment Upper Extremity Assessment: Generalized weakness (full body tremors noted throughout, pt reports this is not baseline; team notified)   Lower Extremity Assessment Lower Extremity Assessment: Generalized weakness   Cervical / Trunk Assessment Cervical / Trunk Assessment: Kyphotic   Communication Communication Communication: No difficulties   Cognition Arousal/Alertness: Awake/alert Behavior During Therapy: WFL for tasks assessed/performed Overall Cognitive Status: No family/caregiver present to determine baseline cognitive functioning Area of Impairment: Awareness, Problem solving                           Awareness: Emergent Problem Solving: Slow processing, Requires verbal cues, Requires tactile cues       General Comments  BP 88/59 (Map 69) HR 86 with c/o dizziness while seated on edge of bed, team notified    Exercises Other Exercises Other Exercises: edu re: role of OT, role of rehab, discharge recommendations, home safety, falls prevention   Shoulder Instructions      Home Living Family/patient expects to be discharged to:: Private residence Living Arrangements: Alone Available Help at Discharge: Family;Available PRN/intermittently Type of Home: Mobile home Home Access: Ramped entrance     Home Layout: One level     Bathroom Shower/Tub: Occupational psychologist: Standard     Home Equipment: Rollator (4 wheels);Rolling Walker (2 wheels);Cane - single point;Wheelchair - manual;Toilet riser   Additional Comments:  (DIL manages medications, provides transportation and check on patient 2x/day)      Prior Functioning/Environment Prior Level of Function : Independent/Modified Independent             Mobility Comments: pt reports +falls in the last few weeks, amb with rollator household distances, with someone present the past few days due to weakness/falls ADLs Comments: pt reports  DIL assists with med management, meals; reports        OT Problem List: Decreased strength;Decreased activity tolerance;Impaired balance (sitting and/or standing);Decreased knowledge of use of DME or AE      OT Treatment/Interventions: Self-care/ADL training;Patient/family education;Therapeutic exercise;Balance training;Energy conservation;Therapeutic activities;DME and/or AE instruction    OT Goals(Current goals can be found in the care plan section) Acute Rehab OT Goals Patient Stated Goal: feel stronger OT Goal Formulation: With patient Time For Goal Achievement: 05/02/22 Potential to Achieve Goals: Good ADL Goals Pt Will Perform Grooming: with supervision Pt Will Perform Upper Body Dressing: with supervision Pt Will Perform Lower Body Dressing: with supervision Pt Will Transfer to Toilet: with supervision;with min guard assist  OT Frequency: Min 2X/week    Co-evaluation              AM-PAC OT "6 Clicks" Daily Activity     Outcome Measure Help from another person eating meals?: None Help from another person taking care of personal grooming?: A Little Help from another person toileting, which includes using toliet, bedpan, or urinal?: A Lot Help from another person bathing (including washing, rinsing, drying)?: A Lot Help from another person to put on and taking off regular upper body clothing?: A Little Help from another person to put on and taking off regular lower body clothing?: A Lot 6 Click  Score: 16   End of Session Nurse Communication: Mobility status (vital signs)  Activity Tolerance: Treatment limited secondary to medical complications (Comment) Patient left: in bed;with call bell/phone within reach;with bed alarm set  OT Visit Diagnosis: Unsteadiness on feet (R26.81);Muscle weakness (generalized) (M62.81);Repeated falls (R29.6)                Time: 5015-8682 OT Time Calculation (min): 21 min Charges:  OT General Charges $OT Visit: 1 Visit OT  Evaluation $OT Eval Moderate Complexity: 1 Mod Shanon Payor, OTD OTR/L  04/18/22, 1:13 PM

## 2022-04-18 NOTE — Assessment & Plan Note (Addendum)
Stable without wheezing. Continue bronchodilators.

## 2022-04-18 NOTE — Hospital Course (Addendum)
Matthew Zamora is a 81 y.o. male with medical history significant of hypertension, hyperlipidemia, COPD, APF on Eliquis, CHF with EF of 35-40%, CAD, anemia, CKD-3B, frequent fall, who presented on 04/17/2022 for evaluation of dizziness and lightheadedness, generalized weakness, near syncopal episodes.  This occurred in the setting of about one week of GI illness with nausea and vomiting.  In the ED, afebrile and stable vitals.   Labs notable for K 5.5, no leukocytosis, normal lactic acid x 2, AST 129, ALT 136, Tbili 1.4.  BNP mildly elevated 354.5, troponin x 2 normal, negative for Covid-19 and flu A/B.  Admitted to medicine service for further evaluation and management.    11/2: pt with positive orthostatic vitals with severe symptoms of dizziness, unable to tolerate being upright.  Remains with poor PO intake, started on IV fluids.  11/3--5: ongoing N/V, dizziness. GI and cardiology consulted.  IV fluids stopped 11/4 due to dyspnea and cxr with mild vascular congestion.  Eliquis held for EGD. 11/6: EGD showed erosive esophagitis, submucosal duodenal nodule - biopsies benign.  Eliquis resumed.  Hospital course has been prolonged and complicated by worsened acute respiratory failure with hypoxia, increased O2 requirements.  Given intermittent diuresis as BP allows.  He had multifocal pneumonia, completed course of antibiotics.  He later had E. Coli infection with diarrhea which has resolved.    Pt has persistently had positive orthostatic BP's, ongoing dizziness when upright.  He has been unable to participate in PT.    He continues to have intermittent nausea/vomiting some days but appears in tolerating adequate PO intake.  Prognosis overall is guarded.  Patient remains high risk for clinical deterioration and readmission.  11/22: Continues to be very orthostatic on PT evaluation  11/23: Increased dose of midodrine to 15 mg 3 times daily and added Reglan 11/24: Nauseous and dizzy 11/25:  Hoping to work with therapy.  Less nauseous today.  Slept well.  11/26: CT chest yesterday showed stable pulmonary nodule, mild epistaxis today so holding aspirin and Eliquis.  Hoping to work with therapy today

## 2022-04-18 NOTE — Assessment & Plan Note (Addendum)
K was 5.5 on admission, now normalized. Maintain K>4 given hx of dysrhythmias

## 2022-04-18 NOTE — Assessment & Plan Note (Addendum)
Stable, no active chest pain. Continue ASA, metoprolol. Lipitor on hold due to elevated LFTs

## 2022-04-18 NOTE — Assessment & Plan Note (Addendum)
Hold metoprolol given soft BPs and severe orthostatic symptoms Diuretic on hold IV hydralazine as needed  Now on midodrine and uptitrating dose of midodrine

## 2022-04-18 NOTE — Assessment & Plan Note (Addendum)
With PT today, patient was extremely dizzy and lightheaded.  Supine BP 109/80, seated BP 75/50 (repeat 80/63), could not tolerating standing position. --Treated IV fluids --Daily orthostatic vitals --Fall precautions --Started midodrine & dose was up-titrated to 10 mg TID WC --TED hose and Abdominal binder  11/19 -- still +orthostatic vitals and dizziness limiting ability to be upright or any mobility

## 2022-04-18 NOTE — Assessment & Plan Note (Addendum)
Patient reported 1 week history of nausea vomiting at home, leading to dehydration and near syncopal episodes with dizziness lightheadedness. Family report N/V issues frequently for several months, chronic issue. -- IV fluids have been stopped due to recurrent dyspnea --IV antiemetics as needed --Monitor renal function and electrolytes --GI consulted, EGD done 11/6 (see erosive esophagitis).

## 2022-04-18 NOTE — Assessment & Plan Note (Addendum)
Hemoglobin stable Monitor CBC.

## 2022-04-18 NOTE — Assessment & Plan Note (Addendum)
On admission, hypovolemic and dehydrated. Diuretics on hold.  Was on IV fluids due to profound dehydration.  He developed worsening dyspnea and BNP was elevated, fluids were stopped and he was gently diuresed. 11/15--21: Gave IV Lasix a couple times this week due to increased O2 requirement up to 4 L after IV fluids were used in the setting of persistent diarrhea and ongoing nausea vomiting.  GI symptoms have improved and patient is tolerating p.o. intake fairly well.  Now stable on room air -- Intermittent diuresis as needed (appears he takes PRN torsemide at home) --Started on midodrine, continue --Metoprolol discontinued due to soft BP and severe orthostasis --Daily weights Net IO Since Admission: -11,044.71 mL [05/08/22 1346]  --Monitor closely -- [facility] cardiology is following.

## 2022-04-18 NOTE — Assessment & Plan Note (Addendum)
Persistent orthostatic hypotension Initially attributed to dehydration in the setting of 1 week nausea vomiting.  Orthostatic vitals were positive and have remained so despite fluids, midodrine, TED hose, binder.  -- Treated with IV fluids initially due to profound dehydration.  Off fluids and has required intermittent diuresis --Daily orthostatic vitals - remain positive --Monitor on telemetry --Supportive care for GI illness as outlined  11/18--22: still +orthostatics, symptomatic, pt cannot tolerate upright position.  No improvement with TED hose and abdominal binder. --Re-consulted [facility] cardiology for possible medication adjustments.  They recommended getting EP's input --Discussed case with Dr. Caryl Comes on 11/21.  Concern for autonomic failure given heart rate does not compensate for orthostatic drop in blood pressure.  He recommended stopping amiodarone and mexiletine.  Indicated midodrine can be used above max dose and sometimes used as high as 20 mg QID.   Also recommended maintaining patient in reverse Trendelenburg (feet down) to encourage it autonomic nervous system to compensate for gravity on a more regular basis.  Abdominal binder only when upright, thigh sleeves likely more helpful than TED hose on the distal lower extremities.  Fludrocortisone is a poor option in this heart failure patient who is very fluid sensitive requiring IV diuresis at times.  Pyridostigmine only works when vertical and upright, can be continued.  Dr. Caryl Comes encouraged to reach out if any further questions. -- Dr. Arbutus Ped had discussed above with Audelia Acton (daughter-in-law) who requested her to reach out to Dr. Luana Shu his EP at [facility].  She had left a voicemail with summary of the case and my cell phone number and was awaiting callback.

## 2022-04-19 DIAGNOSIS — I4729 Other ventricular tachycardia: Secondary | ICD-10-CM

## 2022-04-19 DIAGNOSIS — E871 Hypo-osmolality and hyponatremia: Secondary | ICD-10-CM | POA: Insufficient documentation

## 2022-04-19 DIAGNOSIS — R42 Dizziness and giddiness: Secondary | ICD-10-CM

## 2022-04-19 DIAGNOSIS — I951 Orthostatic hypotension: Secondary | ICD-10-CM | POA: Diagnosis not present

## 2022-04-19 DIAGNOSIS — R7989 Other specified abnormal findings of blood chemistry: Secondary | ICD-10-CM | POA: Diagnosis not present

## 2022-04-19 LAB — COMPREHENSIVE METABOLIC PANEL
ALT: 263 U/L — ABNORMAL HIGH (ref 0–44)
AST: 228 U/L — ABNORMAL HIGH (ref 15–41)
Albumin: 2.7 g/dL — ABNORMAL LOW (ref 3.5–5.0)
Alkaline Phosphatase: 66 U/L (ref 38–126)
Anion gap: 7 (ref 5–15)
BUN: 22 mg/dL (ref 8–23)
CO2: 24 mmol/L (ref 22–32)
Calcium: 8.3 mg/dL — ABNORMAL LOW (ref 8.9–10.3)
Chloride: 106 mmol/L (ref 98–111)
Creatinine, Ser: 1.71 mg/dL — ABNORMAL HIGH (ref 0.61–1.24)
GFR, Estimated: 40 mL/min — ABNORMAL LOW (ref >60)
Glucose, Bld: 115 mg/dL — ABNORMAL HIGH (ref 70–99)
Potassium: 3.5 mmol/L (ref 3.5–5.1)
Sodium: 137 mmol/L (ref 135–145)
Total Bilirubin: 0.6 mg/dL (ref 0.3–1.2)
Total Protein: 4.8 g/dL — ABNORMAL LOW (ref 6.5–8.1)

## 2022-04-19 LAB — HEPATITIS PANEL, ACUTE
HCV Ab: NONREACTIVE
Hep A IgM: NONREACTIVE
Hep B C IgM: NONREACTIVE
Hepatitis B Surface Ag: NONREACTIVE

## 2022-04-19 MED ORDER — METOPROLOL TARTRATE 5 MG/5ML IV SOLN: 5.0000 mg | INTRAVENOUS | Status: DC | PRN

## 2022-04-19 MED ORDER — POTASSIUM CHLORIDE 10 MEQ/100ML IV SOLN
10.0000 meq | INTRAVENOUS | Status: AC
  Administered 2022-04-19 (×3): 10 meq via INTRAVENOUS
  Filled 2022-04-19 (×3): qty 100

## 2022-04-19 NOTE — Progress Notes (Signed)
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 Family at bedside to provide pt's home set up and amount of support once d/c date determined. Pt and family educated on current concerns for limited functional progress due to pt not being able to attain 2 minutes sitting EOB prior to becoming pre-syncopal with increased tremors throughout requiring MaxA to return to supine. Vitals recorded in room and in Flowsheet section. Pt does not appear safe to return home with assistance from family at this time. Will continue per POC and progress as able.    Recommendations for follow up therapy are one component of a multi-disciplinary discharge planning process, led by the attending physician.  Recommendations may be updated based on patient status, additional functional criteria and insurance authorization.  Follow Up Recommendations  Skilled nursing-short term rehab (<3 hours/day) Can patient physically be transported by private vehicle: Yes   Assistance Recommended at Discharge Frequent or constant Supervision/Assistance  Patient can return home with the following A little help with walking and/or transfers;A little help with bathing/dressing/bathroom;Assist for transportation;Help with stairs or ramp for entrance   Equipment Recommendations   (Pt has DME at home)    Recommendations for Other Services       Precautions / Restrictions Precautions Precautions: Fall Precaution Comments: orthostatic Restrictions Weight Bearing Restrictions: No     Mobility  Bed Mobility   Bed Mobility: Supine to Sit, Sit to  Supine     Supine to sit: Min assist Sit to supine: Min assist        Transfers                   General transfer comment: Unable to attain standing due to pre-syncopal symptoms    Ambulation/Gait                   Stairs             Wheelchair Mobility    Modified Rankin (Stroke Patients Only)       Balance Overall balance assessment: History of Falls, Needs assistance Sitting-balance support: Feet supported, Bilateral upper extremity supported Sitting balance-Leahy Scale: Fair Sitting balance - Comments: increased postural sway in sitting wtih temorous movements throughout, ? associated with hypotensive BP                                    Cognition Arousal/Alertness: Awake/alert Behavior During Therapy: WFL for tasks assessed/performed Overall Cognitive Status: Within Functional Limits for tasks assessed (per family)                                          Exercises      General Comments General comments (skin integrity, edema, etc.):  (Increased time spent educating pt/family on BP concerns, increased fall risk due to dropping BP, Pacing technique and adjusting with incresed time to positional changes.)      Pertinent Vitals/Pain Pain Assessment Pain Assessment: No/denies pain  Home Living                          Prior Function            PT Goals (current goals can now be found in the care plan section) Acute Rehab PT Goals Patient Stated Goal: to return home    Frequency    Min 2X/week      PT Plan  (vitals)    Co-evaluation              AM-PAC PT "6 Clicks" Mobility   Outcome Measure  Help needed turning from your back to your side while in a flat bed without using bedrails?: None Help needed moving from lying on your back to sitting on the side of a flat bed without using bedrails?: None Help needed moving to and from a bed to a chair (including a  wheelchair)?: A Little Help needed standing up from a chair using your arms (e.g., wheelchair or bedside chair)?: A Little Help needed to walk in hospital room?: A Little Help needed climbing 3-5 steps with a railing? : A Lot 6 Click Score: 19    End of Session Equipment Utilized During Treatment:  (BP machine) Activity Tolerance:  (pt limited by symptomatic orthostatic hypotension.) Patient left: in bed;with call bell/phone within reach;with bed alarm set;with nursing/sitter in room;with family/visitor present Nurse Communication: Mobility status;Other (comment) PT Visit Diagnosis: Unsteadiness on feet (R26.81);Muscle weakness (generalized) (M62.81);History of falling (Z91.81)     Time: 7858-8502 PT Time Calculation (min) (ACUTE ONLY): 34 min  Charges:  $Therapeutic Activity: 23-37 mins                    [name], PTA    [name] 04/19/2022, 3:15 PM

## 2022-04-19 NOTE — Progress Notes (Signed)
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 Treatment session targeted improving functional activity tolerance in preporation for ADL tasks. Patient was able to perform bed mobility with supervision. Orthostatics taken/ monitored during session:  Supine: 106/68, MAP: 81, HR: 56  EOB: 80/53 MAP 60 HR 88  Edge of bed after exercise: 107/64 MAP 79   While sitting EOB patient completed LE exercises. Transferred sit<>stand with min A with 2 lateral step to [facility] to the right. Poor standing tolerance observed with patient reporting significant dizziness. Multimodal cuing provided for participation in session. Patient placed back in supine with supervision. Patient with c/o cough. Educated on sitting upright when eating and drinking to prevent aspiration. Patient left in bed with bed alarm set, call bell in reach, and all needs met. Discharge recommendation remains appropriate.    Recommendations for follow up therapy are one component of a multi-disciplinary discharge planning process, led by the attending physician.  Recommendations may be updated based on patient status, additional functional criteria and insurance authorization.    Follow Up Recommendations  Skilled nursing-short term rehab (<3 hours/day)    Assistance Recommended at Discharge Frequent or constant Supervision/Assistance  Patient can return home with the following  A lot of help with walking and/or transfers;A lot of help with bathing/dressing/bathroom   Equipment Recommendations  Wheelchair (measurements OT)    Recommendations for Other Services       Precautions / Restrictions Precautions Precautions: Fall Precaution Comments: orthostatic Restrictions Weight Bearing Restrictions: No       Mobility Bed Mobility Overal bed mobility: Needs Assistance Bed Mobility: Supine to Sit, Sit to Supine     Supine to sit: Supervision Sit to supine: Supervision        Transfers Overall transfer level: Needs assistance Equipment used: Rolling walker (2 wheels) Transfers: Sit to/from Stand Sit to Stand: Min assist           General transfer comment: poor standing tolerance     Balance Overall balance assessment: History of Falls, Needs assistance Sitting-balance support: Feet supported, Bilateral upper extremity supported Sitting balance-Leahy Scale: Fair     Standing balance support: Reliant on assistive device for balance, Bilateral upper extremity supported, During functional activity Standing balance-Leahy Scale: Poor                             ADL either performed or assessed with clinical judgement   ADL   Eating/Feeding: Set up;Sitting;Bed level                                          Extremity/Trunk Assessment Upper Extremity Assessment Upper Extremity Assessment: Generalized weakness   Lower Extremity Assessment Lower Extremity Assessment: Generalized weakness        Vision Patient Visual Report: No change from baseline            Cognition Arousal/Alertness: Awake/alert Behavior During Therapy: WFL for tasks assessed/performed Overall Cognitive Status: No family/caregiver present to determine baseline cognitive functioning  Pertinent Vitals/ Pain       Pain Assessment Pain Assessment: No/denies pain   Frequency  Min 2X/week        Progress Toward Goals  OT Goals(current goals can now be found in the care plan section)     Acute Rehab OT Goals Patient Stated Goal: feel  better OT Goal Formulation: With patient Time For Goal Achievement: 05/02/22 Potential to Achieve Goals: Good         AM-PAC OT "6 Clicks" Daily Activity     Outcome Measure   Help from another person eating meals?: None Help from another person taking care of personal grooming?: A Little Help from another person toileting, which includes using toliet, bedpan, or urinal?: A Lot Help from another person bathing (including washing, rinsing, drying)?: A Lot Help from another person to put on and taking off regular upper body clothing?: A Little Help from another person to put on and taking off regular lower body clothing?: A Lot 6 Click Score: 16    End of Session Equipment Utilized During Treatment: Gait belt;Rolling walker (2 wheels)  OT Visit Diagnosis: Unsteadiness on feet (R26.81);Muscle weakness (generalized) (M62.81);Repeated falls (R29.6)   Activity Tolerance Patient limited by fatigue   Patient Left in bed;with call bell/phone within reach;with bed alarm set   Nurse Communication Mobility status (c/o coughing)        Time: 0956-1016 OT Time Calculation (min): 20 min  Charges: OT General Charges $OT Visit: 1 Visit OT Treatments $Therapeutic Activity: 8-22 mins    , OTS 04/19/2022, 1:41 PM 

## 2022-04-19 NOTE — Consult Note (Signed)
CARDIOLOGY CONSULT NOTE               Patient ID: Matthew Zamora MRN: [id number] DOB/AGE: 80/23/1942 80 y.o.  Admit date: 04/17/2022 Referring Physician Dr Ivor Costa hospitalist  Primary Physician [place] Pink primary Primary Cardiologist  Reason for Consultation tachycardia irregular heartbeat  HPI: Patient presented with lightheadedness weakness fatigue has a history of hypertension hyperlipidemia COPD paroxysmal atrial fibrillation on Eliquis congestive heart failure with cardiomyopathy EF of 35 to 40% coronary disease renal insufficiency multiple falls with generalized weakness and fatigue.  The patient came in with lightheaded weakness palpitations has AICD pacemaker in place had episodes of tachycardia on telemetry so cardiology was then consulted for further evaluation patient's been on mexiletine and amiodarone and has had recurrent symptoms usually seen and followed at [facility] but still complains of fatigue poor appetite  Review of systems complete and found to be negative unless listed above     Past Medical History:  Diagnosis Date   Abdominal aortic aneurysm (AAA) ([place]) 05/13/15   seen on ct scan   Adenomatous colon polyp 03/18/2001, 03/14/2009, 10/06/2014   Anemia    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 pylori infection    History of hepatitis    Hyperlipidemia    Hypertension    Liver cyst 05/16/15   PAF (paroxysmal atrial fibrillation) ([place])    Prostate CA ([place])     Past Surgical History:  Procedure Laterality Date   CATARACT EXTRACTION     COLONOSCOPY  10/06/2014, 09/18/2004, 03/14/2009   ESOPHAGOGASTRODUODENOSCOPY  10/06/2014, 03/18/2001, 03/14/2009   ESOPHAGOGASTRODUODENOSCOPY (EGD) WITH PROPOFOL N/A 05/07/2018   Procedure: ESOPHAGOGASTRODUODENOSCOPY (EGD) WITH PROPOFOL;  Surgeon:  Toledo, [name], MD;  Location: [facility] ENDOSCOPY;  Service: Gastroenterology;  Laterality: N/A;   FLEXIBLE SIGMOIDOSCOPY  08/26/1990   INSERTION OF ICD     PROSTATE SURGERY     TONSILLECTOMY      Medications Prior to Admission  Medication Sig Dispense Refill Last Dose   albuterol (VENTOLIN HFA) 108 (90 Base) MCG/ACT inhaler Inhale 2 puffs into the lungs every 4 (four) hours as needed.   04/17/2022 at AM   amiodarone (PACERONE) 400 MG tablet Take 1 tablet (400 mg total) by mouth daily.   04/16/2022   ascorbic acid (VITAMIN C) 500 MG tablet Take 1 tablet by mouth daily.   04/16/2022   aspirin 81 MG chewable tablet Chew 81 mg by mouth daily at 12 noon.   04/16/2022   atorvastatin (LIPITOR) 80 MG tablet Take 80 mg by mouth daily.   04/16/2022 at NIGHT   ELIQUIS 2.5 MG TABS tablet Take 2.5 mg by mouth 2 (two) times daily.   04/16/2022 at NIGHT   fluticasone-salmeterol (ADVAIR) 500-50 MCG/ACT AEPB Inhale 1 puff into the lungs in the morning and at bedtime.   04/16/2022 at AM   gabapentin (NEURONTIN) 100 MG capsule Take 100 mg by mouth at bedtime.   04/16/2022 at NIGHT   magnesium oxide (MAG-OX) 400 (240 Mg) MG tablet Take 1 tablet by mouth daily.   04/16/2022 at AM   melatonin 3 MG TABS tablet Take 9 mg by mouth at bedtime.   04/16/2022 at NIGHT   metoprolol succinate (TOPROL-XL) 25 MG 24 hr tablet Take 1 tablet (25 mg total) by mouth 2 (two) times daily.  Take with or immediately following a meal. 60 tablet 0 04/16/2022 at NIGHT   mexiletine (MEXITIL) 150 MG capsule Take 150 mg by mouth 2 (two) times daily.   04/16/2022 at NIGHT   Multiple Vitamin (MULTIVITAMIN WITH MINERALS) TABS tablet Take 1 tablet by mouth daily.   04/16/2022 at AFTERNOON   omeprazole (PRILOSEC) 20 MG capsule Take 20 mg by mouth every morning.   04/16/2022 at AM   ondansetron (ZOFRAN-ODT) 4 MG disintegrating tablet Take 4 mg by mouth every 8 (eight) hours as needed for refractory nausea / vomiting, vomiting or nausea.   04/16/2022  at AM   sertraline (ZOLOFT) 25 MG tablet Take 25 mg by mouth daily.   04/16/2022 at NIGHT   tamsulosin (FLOMAX) 0.4 MG CAPS capsule Take 0.4 mg by mouth daily.   04/16/2022 at NIGHT   acetaminophen (TYLENOL) 325 MG tablet Take 2 tablets (650 mg total) by mouth every 4 (four) hours as needed for headache or mild pain.   PRN at PRN   albuterol (PROVENTIL) (2.5 MG/3ML) 0.083% nebulizer solution Inhale 3 mLs (2.5 mg total) into the lungs as needed for shortness of breath. 75 mL 12 PRN   benzonatate (TESSALON PERLES) 100 MG capsule Take 1 capsule (100 mg total) by mouth every 6 (six) hours as needed for cough. (Patient not taking: Reported on 04/17/2022) 30 capsule 0 Completed Course   calcium carbonate (TUMS EX) 750 MG chewable tablet Chew 1 tablet by mouth daily.   PRN at PRN   Nebulizer MISC 1 each by Does not apply route as needed. 1 each 0    oxymetazoline (AFRIN) 0.05 % nasal spray Place 2 sprays into both nostrils 2 (two) times daily as needed for congestion (nose bleeds). (Patient not taking: Reported on 04/17/2022) 15 mL 1 Not Taking   tadalafil (CIALIS) 20 MG tablet Take 20 mg by mouth daily.   PRN at PRN   torsemide (DEMADEX) 20 MG tablet Take 1 tablet (20 mg total) by mouth daily as needed. for up to 3 days for increased leg swelling, shortness of breath, weight gain 5+ lbs over 1-2 days. Seek medical care if these symptoms are not improving with increased dose. 30 tablet 0 PRN at PRN   Social History   Socioeconomic History   Marital status: Single    Spouse name: Not on file   Number of children: Not on file   Years of education: Not on file   Highest education level: Not on file  Occupational History   Not on file  Tobacco Use   Smoking status: Former    Packs/day: 1.00    Years: 38.00    Total pack years: 38.00    Types: Cigarettes    Quit date: 06/18/1999    Years since quitting: 22.8   Smokeless tobacco: Never  Vaping Use   Vaping Use: Never used  Substance and Sexual  Activity   Alcohol use: No    Alcohol/week: 0.0 standard drinks of alcohol   Drug use: Not Currently   Sexual activity: Not Currently  Other Topics Concern   Not on file  Social History Narrative   Not on file   Social Determinants of Health   Financial Resource Strain: Not on file  Food Insecurity: No Food Insecurity (04/17/2022)   Hunger Vital Sign    Worried About Running Out of Food in the Last Year: Never true    Ran Out of Food in the Last Year: Never true  Transportation Needs:  No Transportation Needs (04/17/2022)   PRAPARE - Hydrologist (Medical): No    Lack of Transportation (Non-Medical): No  Physical Activity: Not on file  Stress: Not on file  Social Connections: Not on file  Intimate Partner Violence: Not At Risk (04/17/2022)   Humiliation, Afraid, Rape, and Kick questionnaire    Fear of Current or Ex-Partner: No    Emotionally Abused: No    Physically Abused: No    Sexually Abused: No    Family History  Problem Relation Age of Onset   Heart attack Mother    Heart attack Father       Review of systems complete and found to be negative unless listed above      PHYSICAL EXAM  General: Well developed, well nourished, in no acute distress HEENT:  Normocephalic and atramatic Neck:  No JVD.  Lungs: Clear bilaterally to auscultation and percussion. Heart: HRRR . Normal S1 and S2 without gallops or murmurs.  Abdomen: Bowel sounds are positive, abdomen soft and non-tender  Msk:  Back normal, normal gait. Normal strength and tone for age. Extremities: No clubbing, cyanosis or edema.   Neuro: Alert and oriented X 3. Psych:  Good affect, responds appropriately  Labs:   Lab Results  Component Value Date   WBC 9.1 04/17/2022   HGB 10.7 (L) 04/17/2022   HCT 32.7 (L) 04/17/2022   MCV 90.6 04/17/2022   PLT 133 (L) 04/17/2022    Recent Labs  Lab 04/19/22 0603  NA 137  K 3.5  CL 106  CO2 24  BUN 22  CREATININE 1.71*   CALCIUM 8.3*  PROT 4.8*  BILITOT 0.6  ALKPHOS 66  ALT 263*  AST 228*  GLUCOSE 115*   Lab Results  Component Value Date   CKTOTAL 70 03/27/2022   TROPONINI <0.03 09/17/2018    Lab Results  Component Value Date   CHOL 125 06/14/2021   CHOL 139 10/26/2019   Lab Results  Component Value Date   HDL 45 06/14/2021   HDL 34 (L) 10/26/2019   Lab Results  Component Value Date   LDLCALC 71 06/14/2021   LDLCALC 88 10/26/2019   Lab Results  Component Value Date   TRIG 45 06/14/2021   TRIG 87 10/26/2019   TRIG 130 04/30/2018   Lab Results  Component Value Date   CHOLHDL 2.8 06/14/2021   CHOLHDL 4.1 10/26/2019   No results found for: "LDLDIRECT"    Radiology: US ABDOMEN LIMITED RUQ (LIVER/GB)  Result Date: 04/17/2022 CLINICAL DATA:  Abnormal LFT EXAM: ULTRASOUND ABDOMEN LIMITED RIGHT UPPER QUADRANT COMPARISON:  CT abdomen 03/27/2022, 05/10/2020 FINDINGS: Gallbladder: No gallstones or wall thickening visualized. No sonographic Murphy sign noted by sonographer. 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 Multiple septations. This cyst measures 6.1 x 5.5 x 5.4 cm and is stable from the prior CT from this year and 2021. Additional smaller cysts in the right lobe of liver better seen on CT. Portal vein is patent on color Doppler imaging with normal direction of blood flow towards the liver. Other: None. IMPRESSION: 1. The gallbladder and common bile duct are normal. 2. 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 This cyst is stable from the prior CT from this year and 2021. Electronically Signed   By: Franchot Gallo M.D.   On: 04/17/2022 15:29  CT Head Wo Contrast  Result Date: 04/17/2022 CLINICAL DATA:  Syncope EXAM: CT HEAD WITHOUT CONTRAST TECHNIQUE: Contiguous axial images were obtained from the base of the skull through the vertex without intravenous contrast.  RADIATION DOSE REDUCTION: This exam was performed according to the departmental dose-optimization program which includes automated exposure control, adjustment of the mA and/or kV according to patient size and/or use of iterative reconstruction technique. COMPARISON:  CT head 03/27/2022 FINDINGS: Brain: No evidence of acute infarction, hemorrhage, hydrocephalus, extra-axial collection or mass lesion/mass effect. Mild white matter hypodensity consistent with chronic microvascular ischemia Vascular: Negative for hyperdense vessel Skull: Negative Sinuses/Orbits: Paranasal sinuses clear. Bilateral cataract extraction Other: None IMPRESSION: No acute abnormality no interval change. Electronically Signed   By: Franchot Gallo M.D.   On: 04/17/2022 11:22   DG Chest Portable 1 View  Result Date: 04/10/2022 CLINICAL DATA:  Syncope, fall EXAM: PORTABLE CHEST 1 VIEW COMPARISON:  03/27/2022 FINDINGS: Cardiac size is within normal limits. Pacemaker/defibrillator battery is seen in left infraclavicular region. Coronary artery stents are noted. Lung fields are clear of any infiltrates or pulmonary edema. There is no pleural effusion or pneumothorax. IMPRESSION: There are no signs of pulmonary edema or focal pulmonary consolidation. Electronically Signed   By: Elmer Picker M.D.   On: 04/10/2022 15:30   DG Knee Complete 4 Views Left  Result Date: 03/27/2022 CLINICAL DATA:  Tripped and fell while using a walker today striking knees, pain worse on LEFT EXAM: LEFT KNEE - COMPLETE 4+ VIEW COMPARISON:  None FINDINGS: Osseous demineralization. Scattered joint space narrowing. No acute fracture, dislocation, or bone destruction. No joint effusion. Scattered atherosclerotic calcifications. IMPRESSION: Osseous demineralization with degenerative changes LEFT knee. No acute abnormalities. Electronically Signed   By: Lavonia Dana M.D.   On: 03/27/2022 16:46   DG Knee Complete 4 Views Right  Result Date: 03/27/2022 CLINICAL  DATA:  Tripped and fell earlier today while using a walker, fell striking knees, pain in knees LEFT greater than RIGHT EXAM: RIGHT KNEE - COMPLETE 4+ VIEW COMPARISON:  None Available. FINDINGS: Osseous demineralization. Joint space narrowing and scattered chondrocalcinosis. No acute fracture, dislocation, or bone destruction. No joint effusion. Atherosclerotic calcifications. IMPRESSION: Osseous demineralization with degenerative changes and question CPPD RIGHT knee. No acute abnormalities. Electronically Signed   By: Lavonia Dana M.D.   On: 03/27/2022 16:46   CT Head Wo Contrast  Result Date: 03/27/2022 CLINICAL DATA:  Mechanical fall.  Laceration to forehead. EXAM: CT HEAD WITHOUT CONTRAST CT CERVICAL SPINE WITHOUT CONTRAST TECHNIQUE: Multidetector CT imaging of the head and cervical spine was performed following the standard protocol without intravenous contrast. Multiplanar CT image reconstructions of the cervical spine were also generated. RADIATION DOSE REDUCTION: This exam was performed according to the departmental dose-optimization program which includes automated exposure control, adjustment of the mA and/or kV according to patient size and/or use of iterative reconstruction technique. COMPARISON:  CT head and CT cervical spine 02/03/2022 FINDINGS: CT HEAD FINDINGS Brain: No evidence of acute infarction, hemorrhage, hydrocephalus, extra-axial collection or mass lesion/mass effect. Vascular: No hyperdense vessel or unexpected calcification. Skull: Normal. Negative for fracture or focal lesion. Sinuses/Orbits: No acute finding. Visualized paranasal sinuses and mastoid air cells are clear. Hypoplastic frontal sinuses. Other: Mild soft tissue swelling just left of midline of the superior aspect of the forehead. A 1.1 cm oval circumscribed soft tissue density mass in the subcutaneous tissues of the right frontoparietal scalp is unchanged compared to 02/03/2022, favored to represent a benign epidermal  inclusion  cyst (series 5, image 13). CT CERVICAL SPINE FINDINGS Alignment: Straightening of the normal cervical lordosis. Trace 0.2 cm retrolisthesis of C3 on C4 and C6 on C7, unchanged compared to prior exam. Skull base and vertebrae: No acute fracture. No primary bone lesion or focal pathologic process. Soft tissues and spinal canal: No prevertebral fluid or swelling. No visible canal hematoma. Disc levels: Severe osteoarthritis of the atlantodental joint and moderate-to-severe osteoarthritis of the bilateral atlantooccipital joints. Multilevel mild-to-moderate degenerative disc disease with loss of disc space height and marginal osteophytes, most pronounced at C3-C4, C5-C6, and C6-C7. Multilevel moderate-to-severe bilateral facet arthropathy, more pronounced on the left. In combination with uncovertebral spurring, there is moderate left neural foraminal narrowing at C3-C4. No high-grade neural foraminal narrowing or spinal canal stenosis. Upper chest: Negative. Other: None. IMPRESSION: CT head: 1. No acute intracranial abnormality.  No calvarial fracture. 2. Mild soft tissue swelling just left of midline at the superior aspect of the forehead, consistent with history of trauma. CT cervical spine: 1. No fracture or traumatic malalignment. 2. Multilevel degenerative changes as described in the body of the report. Electronically Signed   By: Ileana Roup M.D.   On: 03/27/2022 16:20   CT Cervical Spine Wo Contrast  Result Date: 03/27/2022 CLINICAL DATA:  Mechanical fall.  Laceration to forehead. EXAM: CT HEAD WITHOUT CONTRAST CT CERVICAL SPINE WITHOUT CONTRAST TECHNIQUE: Multidetector CT imaging of the head and cervical spine was performed following the standard protocol without intravenous contrast. Multiplanar CT image reconstructions of the cervical spine were also generated. RADIATION DOSE REDUCTION: This exam was performed according to the departmental dose-optimization program which includes automated  exposure control, adjustment of the mA and/or kV according to patient size and/or use of iterative reconstruction technique. COMPARISON:  CT head and CT cervical spine 02/03/2022 FINDINGS: CT HEAD FINDINGS Brain: No evidence of acute infarction, hemorrhage, hydrocephalus, extra-axial collection or mass lesion/mass effect. Vascular: No hyperdense vessel or unexpected calcification. Skull: Normal. Negative for fracture or focal lesion. Sinuses/Orbits: No acute finding. Visualized paranasal sinuses and mastoid air cells are clear. Hypoplastic frontal sinuses. Other: Mild soft tissue swelling just left of midline of the superior aspect of the forehead. A 1.1 cm oval circumscribed soft tissue density mass in the subcutaneous tissues of the right frontoparietal scalp is unchanged compared to 02/03/2022, favored to represent a benign epidermal inclusion cyst (series 5, image 13). CT CERVICAL SPINE FINDINGS Alignment: Straightening of the normal cervical lordosis. Trace 0.2 cm retrolisthesis of C3 on C4 and C6 on C7, unchanged compared to prior exam. Skull base and vertebrae: No acute fracture. No primary bone lesion or focal pathologic process. Soft tissues and spinal canal: No prevertebral fluid or swelling. No visible canal hematoma. Disc levels: Severe osteoarthritis of the atlantodental joint and moderate-to-severe osteoarthritis of the bilateral atlantooccipital joints. Multilevel mild-to-moderate degenerative disc disease with loss of disc space height and marginal osteophytes, most pronounced at C3-C4, C5-C6, and C6-C7. Multilevel moderate-to-severe bilateral facet arthropathy, more pronounced on the left. In combination with uncovertebral spurring, there is moderate left neural foraminal narrowing at C3-C4. No high-grade neural foraminal narrowing or spinal canal stenosis. Upper chest: Negative. Other: None. IMPRESSION: CT head: 1. No acute intracranial abnormality.  No calvarial fracture. 2. Mild soft tissue  swelling just left of midline at the superior aspect of the forehead, consistent with history of trauma. CT cervical spine: 1. No fracture or traumatic malalignment. 2. Multilevel degenerative changes as described in the body of the report. Electronically Signed  By: Ileana Roup M.D.   On: 03/27/2022 16:20   DG Chest 1 View  Result Date: 03/27/2022 CLINICAL DATA:  Weakness and dehydration. EXAM: CHEST  1 VIEW COMPARISON:  02/11/2022 FINDINGS: The cardiac silhouette, mediastinal and hilar contours are within normal limits and stable. Stable tortuosity and calcification of the thoracic aorta. The pacer wires/AICD are stable. There is a slight kink noted in the right ventricular lead which is unchanged. Coronary artery stents are noted. Chronic bronchitic type lung changes but no acute pulmonary process is identified. No pleural effusions. No pulmonary lesions. The bony thorax is intact. IMPRESSION: Chronic bronchitic type lung changes but no acute overlying pulmonary process. Electronically Signed   By: Marijo Sanes M.D.   On: 03/27/2022 08:18   CT ABDOMEN PELVIS W CONTRAST  Result Date: 03/27/2022 CLINICAL DATA:  81 year old male with increased weakness and nausea vomiting for the past week. EXAM: CT ABDOMEN AND PELVIS WITH CONTRAST TECHNIQUE: Multidetector CT imaging of the abdomen and pelvis was performed using the standard protocol following bolus administration of intravenous contrast. RADIATION DOSE REDUCTION: This exam was performed according to the departmental dose-optimization program which includes automated exposure control, adjustment of the mA and/or kV according to patient size and/or use of iterative reconstruction technique. CONTRAST:  55m OMNIPAQUE IOHEXOL 300 MG/ML  SOLN COMPARISON:  CT Abdomen and Pelvis 01/03/2022 and earlier. FINDINGS: Lower chest: Chronic cardiac pacer leads. Mild cardiomegaly. No pericardial or pleural effusion. Stable mild linear scarring in the right lower lobe.  Hepatobiliary: Stable. Scattered chronic small hepatic cysts or hemangiomas, and a large lobulated right inferior patent lobe cyst are stable since 2021 (no follow-up imaging recommended). Pancreas: Stable and within normal limits for age. Spleen: Stable and negative. Adrenals/Urinary Tract: Normal adrenal glands. Stable kidneys with chronic renal volume loss more apparent on the right. Symmetric renal enhancement and contrast excretion. Small benign appearing renal cysts are stable since 2021 (largest at the right lower pole has simple fluid density (no follow-up imaging recommended). Renal vascular calcifications with no obvious nephrolithiasis. Decompressed ureters. Decompressed and unremarkable bladder. Stomach/Bowel: Redundant large bowel with similar retained stool since July. No large bowel inflammation. Normal appendix medial to the cecum terminating on series 2, image 61. Decompressed terminal ileum. No dilated small bowel. Stomach and duodenum appear stable and negative. No free air, free fluid, or mesenteric inflammation identified. Vascular/Lymphatic: Chronic Aortoiliac calcified atherosclerosis. Chronic infrarenal abdominal aortic aneurysm is stable at 32-33 mm diameter (series 2, image 31). No periaortic hematoma. Stable tortuosity of the visible distal thoracic aorta. Major arterial structures remain patent despite atherosclerosis. 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 Atherosclerosis continues into the proximal femoral arteries. Portal venous system is patent. No lymphadenopathy. Reproductive: Evidence of previous prostatectomy and bilateral pelvic sidewall lymph node dissection. Stable multiple chronic pelvic surgical clips. Other: No pelvic free fluid. Musculoskeletal: No acute osseous abnormality identified. IMPRESSION: 1. No acute or inflammatory process identified in the abdomen or pelvis. But redundant large bowel with ongoing retained stool.  Consider constipation. 2. Stable infrarenal Abdominal Aortic Aneurysm, 32-33 mm diameter. Recommend follow-up every 3 years. Reference: J Am Coll Radiol [id number];[id number] Aortic aneurysm NOS (ICD10-I71.9). 3. Aortic Atherosclerosis (ICD10-I70.0). 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 Electronically Signed   By: HGenevie AnnM.D.   On: 03/27/2022 05:27    EKG: Rapid atrial fibrillation rate of 100 nonspecific ST-T wave changes  ASSESSMENT AND PLAN:  Weakness fatigue Palpitations tachycardia Cardiomyopathy  Congestive heart failure cardiomyopathy systolic Lightheaded dizziness Hyperlipidemia Paroxysmal atrial fibrillation Hypertension Coronary artery disease Chronic renal insufficiency COPD . Plan Continue telemetry continue to follow recommend anticoagulation Continue mexiletine reduce amiodarone to help with arrhythmias Recommend adequate hydration gently for generalized weakness and fatigue Midodrine for hypotension support Continue metoprolol for irregular heartbeat and palpitations Consider nephrology input for renal insufficiency Elevated LFTs unclear etiology potentially related hepatitis consider reducing statin amiodarone and/or mexiletine Continue conservative management care   Signed: Yolonda Kida MD 04/19/2022, 2:43 PM

## 2022-04-19 NOTE — Progress Notes (Addendum)
Progress Note   Patient: Matthew Zamora:[id number] DOB: [birth year] DOA: 04/17/2022     1 DOS: the patient was seen and examined on 04/19/2022   Brief hospital course: Matthew Zamora is a 81 y.o. male with medical history significant of hypertension, hyperlipidemia, COPD, APF on Eliquis, CHF with EF of 35-40%, CAD, anemia, CKD-3B, frequent fall, who presented on 04/17/2022 for evaluation of dizziness and lightheadedness, generalized weakness, near syncopal episodes.  This occurred in the setting of about one week of GI illness with nausea and vomiting.  In the ED, afebrile and stable vitals.   Labs notable for K 5.5, no leukocytosis, normal lactic acid x 2, AST 129, ALT 136, Tbili 1.4.  BNP mildly elevated 354.5, troponin x 2 normal, negative for Covid-19 and flu A/B.  Admitted to medicine service for further evaluation and management.    Pt later reported severe constipation at home, with associated lower abdominal pain. Improved after enema and having BM's.  11/2: pt with positive orthostatic vitals with severe symptoms of dizziness, unable to tolerate being upright.  Remains with poor PO intake, started on IV fluids.     Assessment and Plan: * Near syncope Most likely due to dehydration in the setting of 1 week nausea vomiting.  Orthostatic vitals positive.  Patient appears dry on exam as well. -- Start IV fluids --Daily orthostatic vitals --Monitor on telemetry --Supportive care for GI illness as outlined   Nausea & vomiting Patient reported 1 week history of nausea vomiting at home, leading to dehydration and near syncopal episodes with dizziness lightheadedness. -- IV fluids --IV antiemetics as needed --Monitor renal function and electrolytes --Advance diet as tolerated  NSVT (nonsustained ventricular tachycardia) ([name]) Central telemetry reported brief run of V-fib followed by nonsustained VT today (11/3). Patient has AICD in place. Consult cardiology. Patient  on amiodarone, mexiletine. There is drug interaction with Amio increasing effects of mexiletine, side effects of mexiletine are similar patient's presenting symptoms. Appreciate cardiology recommendations. Monitor on telemetry. Maintain K above 4, mag above 2  Orthostatic hypotension With PT today, patient was extremely dizzy and lightheaded.  Supine BP 109/80, seated BP 75/50 (repeat 80/63), could not tolerating standing position. --IV fluids --Daily orthostatic vitals --Fall precautions  Abnormal LFTs On admission AST 129, ALT 136, total bili 1.4. RUQ ultrasound showed normal gallbladder and CBD, stable complex cyst in the right lobe of liver.  Suspect due to acute GI illness. -- Monitor LFTs -- Check acute hepatitis panel  Hyperkalemia K was 5.5 on admission, now normalized.   CKD (chronic kidney disease), stage IIIb Baseline creatinine 1.7-1.8.  Renal function on admission was at baseline. Creatinine up slightly today 2.04, suspect due to dehydration given GI illness and orthostatic hypotension. -- Started IV fluids --Daily BMP to monitor --Avoid nephrotoxins, hypotension --Renally dose meds  Chronic systolic CHF (congestive heart failure) ([name]) Currently hypovolemic and dehydrated. Diuretics on hold. Monitor volume status closely while on IV fluids.  Paroxysmal atrial fibrillation ([name]) Rate controlled. Eliquis, amiodarone, Mexitil Oral metoprolol now on hold due to hypotension On telemetry Lopressor IV PRN for HR sustained above 110  Essential hypertension Hold metoprolol given soft BPs and severe orthostatic symptoms Diuretic on hold IV hydralazine as needed   Hyperlipidemia Lipitor on hold due to elevated LFTs  CAD (coronary artery disease) Stable, no active chest pain. Continue aspirin, metoprolol. Lipitor on hold due to elevated LFTs.  COPD (chronic obstructive pulmonary disease) ([name]) Stable without wheezing. Continue bronchodilators  Normocytic  anemia Hemoglobin  stable 10.8, 10.7. Monitor CBC  Hyponatremia Likely hypovolemic in setting of N/V and dehydration. Resolved.        Subjective: Patient was sitting up awake when seen on rounds this morning.  He reports ongoing nausea but not vomiting.  Still with very poor p.o. intake.  He also continues to report severe dizziness.  Notified by nursing this morning that central telemetry reported a brief run of V-fib followed by V. tach and return to normal sinus.  Patient reportedly asymptomatic.  Physical Exam: Vitals:   04/19/22 0457 04/19/22 0817 04/19/22 1140 04/19/22 1300  BP:  (!) 102/34 (!) 118/56   Pulse:  (!) 102 62   Resp:  18 18   Temp:  99 F (37.2 C) 98.1 F (36.7 C)   TempSrc:      SpO2:  100% 100% 100%  Weight: 59.7 kg     Height:       General exam: Sleeping awakes to voice but quickly falls back to sleep, no acute distress Respiratory system: CTAB, no wheezes, rales or rhonchi, normal respiratory effort. Cardiovascular system: normal S1/S2, RRR, no JVD, no peripheral edema.   Gastrointestinal system: soft, NT, ND, no HSM felt, +bowel sounds. Central nervous system: alert, oriented, no gross focal neurologic deficits, normal speech Extremities: moves all, no edema, normal tone Skin: Poor skin turgor, cyst on bilateral extremities, normal temperature Psychiatry: normal mood, flat affect, judgement and insight appear normal    Labs and data Reviewed:  Notable labs --- potassium 3.5, glucose 115, creatinine improved 1.71 from 2.04 calcium 8.3 with albumin 2.7 AST 228 up from 120, ALT 263 up from 151, total bili normalized  Right upper quadrant ultrasound with normal gallbladder and common bile duct, stable right hepatic lobe complex cyst    Family Communication: None at bedside, will attempt to call  Disposition: Status is: Inpatient Remains inpatient appropriate because: severe orthostatic dizziness, on IV fluids, not tolerating adequate PO intake,  ongoing evaluation, cardiac dysrhythmias with medication changes and cardiology consult pending.      Planned Discharge Destination: Home    Time spent: 45 minutes  Author: Ezekiel Slocumb, DO 04/19/2022 3:09 PM  For on call review [email or web address].

## 2022-04-19 NOTE — Assessment & Plan Note (Addendum)
Likely hypovolemic in setting of N/V and dehydration.  Sodium 132 today

## 2022-04-19 NOTE — Assessment & Plan Note (Addendum)
Patient has AICD in place. --[facility] cardiology, they now recommend EP input with [place].  See Near Syncope. --Has been on amiodarone, mexiletine in past.   --There is drug interaction with Amio increasing effects of mexiletine, side effects of mexiletine are similar patient's presenting symptoms. 11/21 EP Dr. Caryl Comes recommended stopping these as the combination would explain patient's persistent symptoms, currently d/c'd orders for these.  Outpatient evaluation with EP at [facility], Dr. Luana Shu --Monitor on telemetry. --Maintain K above 4, mag above 2

## 2022-04-19 NOTE — TOC CM/SW Note (Signed)
  Transition of Care [facility]) Screening Note   Patient Details  Name: Matthew Zamora Date of Birth: 03-May-1941   Transition of Care [employer]) CM/SW Contact:    Colen Darling, [place] Phone Number: 04/19/2022, 11:10 AM    Transition of Care Department [facility]) has reviewed patient and no TOC needs have been identified at this time. We will continue to monitor patient advancement through interdisciplinary progression rounds. If new patient transition needs arise, please place a TOC consult.

## 2022-04-20 ENCOUNTER — Inpatient Hospital Stay: Payer: Medicare Other

## 2022-04-20 DIAGNOSIS — R112 Nausea with vomiting, unspecified: Secondary | ICD-10-CM | POA: Diagnosis not present

## 2022-04-20 DIAGNOSIS — R55 Syncope and collapse: Secondary | ICD-10-CM | POA: Diagnosis not present

## 2022-04-20 DIAGNOSIS — R0600 Dyspnea, unspecified: Secondary | ICD-10-CM | POA: Diagnosis not present

## 2022-04-20 DIAGNOSIS — R7401 Elevation of levels of liver transaminase levels: Secondary | ICD-10-CM | POA: Diagnosis not present

## 2022-04-20 LAB — BRAIN NATRIURETIC PEPTIDE: B Natriuretic Peptide: 764.9 pg/mL — ABNORMAL HIGH (ref 0.0–100.0)

## 2022-04-20 LAB — CBC
HCT: 29.9 % — ABNORMAL LOW (ref 39.0–52.0)
Hemoglobin: 9.8 g/dL — ABNORMAL LOW (ref 13.0–17.0)
MCH: 29.4 pg (ref 26.0–34.0)
MCHC: 32.8 g/dL (ref 30.0–36.0)
MCV: 89.8 fL (ref 80.0–100.0)
Platelets: 145 10*3/uL — ABNORMAL LOW (ref 150–400)
RBC: 3.33 MIL/uL — ABNORMAL LOW (ref 4.22–5.81)
RDW: 14.2 % (ref 11.5–15.5)
WBC: 7.9 10*3/uL (ref 4.0–10.5)
nRBC: 0 % (ref 0.0–0.2)

## 2022-04-20 LAB — COMPREHENSIVE METABOLIC PANEL
ALT: 403 U/L — ABNORMAL HIGH (ref 0–44)
AST: 331 U/L — ABNORMAL HIGH (ref 15–41)
Albumin: 3 g/dL — ABNORMAL LOW (ref 3.5–5.0)
Alkaline Phosphatase: 75 U/L (ref 38–126)
Anion gap: 5 (ref 5–15)
BUN: 19 mg/dL (ref 8–23)
CO2: 23 mmol/L (ref 22–32)
Calcium: 8.5 mg/dL — ABNORMAL LOW (ref 8.9–10.3)
Chloride: 108 mmol/L (ref 98–111)
Creatinine, Ser: 1.5 mg/dL — ABNORMAL HIGH (ref 0.61–1.24)
GFR, Estimated: 47 mL/min — ABNORMAL LOW (ref >60)
Glucose, Bld: 95 mg/dL (ref 70–99)
Potassium: 4.3 mmol/L (ref 3.5–5.1)
Sodium: 136 mmol/L (ref 135–145)
Total Bilirubin: 0.6 mg/dL (ref 0.3–1.2)
Total Protein: 5.2 g/dL — ABNORMAL LOW (ref 6.5–8.1)

## 2022-04-20 LAB — MAGNESIUM: Magnesium: 1.7 mg/dL (ref 1.7–2.4)

## 2022-04-20 LAB — GLUCOSE, CAPILLARY: Glucose-Capillary: 88 mg/dL (ref 70–99)

## 2022-04-20 LAB — TROPONIN I (HIGH SENSITIVITY)
Troponin I (High Sensitivity): 19 ng/L — ABNORMAL HIGH (ref <18)
Troponin I (High Sensitivity): 20 ng/L — ABNORMAL HIGH (ref <18)

## 2022-04-20 LAB — D-DIMER, QUANTITATIVE: D-Dimer, Quant: 0.47 ug/mL-FEU (ref 0.00–0.50)

## 2022-04-20 MED ORDER — PANTOPRAZOLE SODIUM 40 MG IV SOLR
40.0000 mg | Freq: Two times a day (BID) | INTRAVENOUS | Status: DC
  Administered 2022-04-20 – 2022-04-22 (×4): 40 mg via INTRAVENOUS
  Filled 2022-04-20 (×4): qty 10

## 2022-04-20 MED ORDER — MECLIZINE HCL 25 MG PO TABS
25.0000 mg | ORAL_TABLET | Freq: Two times a day (BID) | ORAL | Status: DC | PRN
  Filled 2022-04-20: qty 1

## 2022-04-20 MED ORDER — ONDANSETRON HCL 4 MG/2ML IJ SOLN
4.0000 mg | Freq: Four times a day (QID) | INTRAMUSCULAR | Status: DC
  Administered 2022-04-20 – 2022-04-23 (×13): 4 mg via INTRAVENOUS
  Filled 2022-04-20 (×13): qty 2

## 2022-04-20 MED ORDER — MIDODRINE HCL 5 MG PO TABS
5.0000 mg | ORAL_TABLET | Freq: Three times a day (TID) | ORAL | Status: DC
  Administered 2022-04-20 – 2022-04-24 (×11): 5 mg via ORAL
  Filled 2022-04-20 (×11): qty 1

## 2022-04-20 MED ORDER — MAGNESIUM SULFATE 2 GM/50ML IV SOLN
2.0000 g | Freq: Once | INTRAVENOUS | Status: AC
  Administered 2022-04-20: 2 g via INTRAVENOUS
  Filled 2022-04-20: qty 50

## 2022-04-20 NOTE — Progress Notes (Signed)
tele called rhythm change from atrial paced 1degree AVB to BBB. Notified MD Arbutus Ped

## 2022-04-20 NOTE — Progress Notes (Addendum)
Received handoff from [facility]. At bedside shift report pt sitting in bed after eating lunch.c/o nausea. Discussed plan of care. Pt agreeable to another dose of Zofran once available. Current order for q 8 hours not available until 5:40 pm. Notified MD of nausea complaint. Awaiting response.   1340- MD adjust Zofran order to q 6 hours.

## 2022-04-20 NOTE — Progress Notes (Signed)
Progress Note   Patient: Matthew Zamora BJY:[id number] DOB: 09/25/1940 DOA: 04/17/2022     2 DOS: the patient was seen and examined on 04/20/2022   Brief hospital course: Matthew Zamora is a 81 y.o. male with medical history significant of hypertension, hyperlipidemia, COPD, APF on Eliquis, CHF with EF of 35-40%, CAD, anemia, CKD-3B, frequent fall, who presented on 04/17/2022 for evaluation of dizziness and lightheadedness, generalized weakness, near syncopal episodes.  This occurred in the setting of about one week of GI illness with nausea and vomiting.  In the ED, afebrile and stable vitals.   Labs notable for K 5.5, no leukocytosis, normal lactic acid x 2, AST 129, ALT 136, Tbili 1.4.  BNP mildly elevated 354.5, troponin x 2 normal, negative for Covid-19 and flu A/B.  Admitted to medicine service for further evaluation and management.    Pt later reported severe constipation at home, with associated lower abdominal pain. Improved after enema and having BM's.  11/2: pt with positive orthostatic vitals with severe symptoms of dizziness, unable to tolerate being upright.  Remains with poor PO intake, started on IV fluids.     Assessment and Plan: * Near syncope Most likely due to dehydration in the setting of 1 week nausea vomiting.  Orthostatic vitals positive.  Patient appears dry on exam as well. -- Start IV fluids --Daily orthostatic vitals --Monitor on telemetry --Supportive care for GI illness as outlined   Dyspnea Overnight 11/3-4, pt reported feeling short of breath. Has been on IV fluids for dehydration, orthostatic hypotension.  CXR obtained, showed borderline vascular congestions. --Stop IV fluids --Defer Lasix given hypotension, can give low dose if BP will tolerate --Monitor closely --Supplement O2 if sats < 90% or PRN for comfort  Nausea & vomiting Patient reported 1 week history of nausea vomiting at home, leading to dehydration and near syncopal episodes  with dizziness lightheadedness. Family report N/V issues frequently for several months, chronic issue. -- IV fluids - stopped this AM due to dyspnea, likely mild volume overload --IV antiemetics as needed --Monitor renal function and electrolytes --GI consulted for further evaluation given persistence --On full liquid diet for now, advance per GI --Possible EGD in 2-3 days if not improved --Hold Eliquis  NSVT (nonsustained ventricular tachycardia) ([place]) Central telemetry reported brief run of V-fib followed by nonsustained VT today (11/3). Patient has AICD in place. Consult cardiology. Patient on amiodarone, mexiletine. There is drug interaction with Amio increasing effects of mexiletine, side effects of mexiletine are similar patient's presenting symptoms. Appreciate cardiology recommendations. Monitor on telemetry. Maintain K above 4, mag above 2  Orthostatic hypotension With PT today, patient was extremely dizzy and lightheaded.  Supine BP 109/80, seated BP 75/50 (repeat 80/63), could not tolerating standing position. --Treated IV fluids, stopped 11/4 due to dyspnea and mild vascular congestion on CXR --Daily orthostatic vitals --Fall precautions --Will start midodrine since persistent despite adequate IV hydration  Abnormal LFTs On admission AST 129, ALT 136, total bili 1.4. RUQ ultrasound showed normal gallbladder and CBD, stable complex cyst in the right lobe of liver.  Suspect due to acute GI illness. Acute hepatitis panel negative. 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 -- Monitor LFTs daily --GI consulted given persistent N/V of unclear cause  Hyperkalemia K was 5.5 on admission, now normalized.   CKD (chronic kidney disease), stage IIIb Baseline creatinine 1.7-1.8.  Renal function on admission was at baseline. Creatinine up slightly today 2.04, suspect due to dehydration given GI illness and orthostatic hypotension. -- Started IV  fluids, stopped 11/4 AM --Daily BMP to  monitor --Avoid nephrotoxins, hypotension --Renally dose meds  Chronic systolic CHF (congestive heart failure) ([place]) On admission, hypovolemic and dehydrated. Diuretics on hold.  Was on IV fluids. Still appears overall euvolemic, but increasing dyspnea. CXR with borderline vascular congestion and BNP 764 up from 300's. May be developing mild decompensation. --Hold Lasix until BP will tolerate --Started on midodrine to help BP --Daily weights --Monitor closely --Cardiology following  Paroxysmal atrial fibrillation ([place]) Rate controlled. Eliquis, amiodarone, Mexitil Oral metoprolol now on hold due to hypotension On telemetry Lopressor IV PRN for HR sustained above 110  Essential hypertension Hold metoprolol given soft BPs and severe orthostatic symptoms Diuretic on hold IV hydralazine as needed   Hyperlipidemia Lipitor on hold due to elevated LFTs  CAD (coronary artery disease) Stable, no active chest pain. Continue aspirin, metoprolol. Lipitor on hold due to elevated LFTs.  COPD (chronic obstructive pulmonary disease) ([name]) Stable without wheezing. Continue bronchodilators  Normocytic anemia Hemoglobin stable 10.8, 10.7. Monitor CBC  Hyponatremia Likely hypovolemic in setting of N/V and dehydration. Resolved.        Subjective: Patient sitting up awake in bed this AM.  He reports still being very dizzy, feeling like he might pass out.  He reported ongoing nausea, had not vomited in awhile and ate okay yesterday, but shortly after encounter today, he started having vomiting and dry heaves again.    Family report his N/V has been a problem for several months.  Physical Exam: Vitals:   04/20/22 0528 04/20/22 0806 04/20/22 0956 04/20/22 1205  BP: 132/73 127/70 115/71 101/64  Pulse: 82 67 (!) 106 (!) 106  Resp: '18 17 20 18  '$ Temp: 98.1 F (36.7 C) 98.7 F (37.1 C) 98.3 F (36.8 C) 98 F (36.7 C)  TempSrc: Oral     SpO2: 95% 99% 99% 100%  Weight:       Height:       General exam: awake resting in bed, ill-appearing (wretching, coughing), no acute distress Respiratory system: using accessory muscles, no wheezes or rhonchi. Cardiovascular system: RRR, no peripheral edema.   Gastrointestinal system: soft, NT, ND Central nervous system: alert, oriented, no gross focal neurologic deficits, normal speech Extremities: moves all, no edema, normal tone Skin: Improved skin turgor, cyst on bilateral extremities, normal temperature Psychiatry: normal mood, flat affect, judgement and insight appear normal    Labs and data Reviewed: Notable labs --- Cr 1.50 improving, Ca 8.5, albumin 3.0, AST 331, ALT 403 (LFT's rising), Tprog 5.2, BNP 764.9, troponin 19>>20,  Hbg 9.8, Plt 145k  Chest xray overnight with borderline vascular congestion.    Family Communication: updated son and daughter-in-law by phone this afternoon (11/4).  Disposition: Status is: Inpatient Remains inpatient appropriate because: severe orthostatic dizziness, on IV fluids, not tolerating adequate PO intake, ongoing evaluation, cardiac dysrhythmias with medication changes and cardiology consult pending.      Planned Discharge Destination: Home    Time spent: 45 minutes  Author: Ezekiel Slocumb, DO 04/20/2022 4:02 PM  For on call review [email or web address].

## 2022-04-20 NOTE — Progress Notes (Signed)
Cross Cover Patient with shortness of breath Chest xray show mild vascular congestio With repeated episodes of dizziness, transaminitis and /aki suggest echo, prior 07/2021 Chem panel still pending, wil likely need some IV lasix if renal function still improving Kathlene Cote NP

## 2022-04-20 NOTE — Progress Notes (Signed)
   04/20/22 0806  Gastrointestinal  GI Symptoms Nausea;Vomiting;Loss of appetite  Nausea relieved by Antiemetic   Notififed MD Arbutus Ped

## 2022-04-20 NOTE — Progress Notes (Signed)
[facility]    SUBJECTIVE: Patient resting comfortably in bed generalized weakness fatigue denies any further nausea or vomiting still has some mild lightheadedness.  Denies any further AICD discharges   Vitals:   04/20/22 0528 04/20/22 0806 04/20/22 0956 04/20/22 1205  BP: 132/73 127/70 115/71 101/64  Pulse: 82 67 (!) 106 (!) 106  Resp: '18 17 20 18  '$ Temp: 98.1 F (36.7 C) 98.7 F (37.1 C) 98.3 F (36.8 C) 98 F (36.7 C)  TempSrc: Oral     SpO2: 95% 99% 99% 100%  Weight:      Height:         Intake/Output Summary (Last 24 hours) at 04/20/2022 1402 Last data filed at 04/20/2022 8338 Gross per 24 hour  Intake --  Output 700 ml  Net -700 ml      PHYSICAL EXAM  General: Well developed, well nourished, in no acute distress HEENT:  Normocephalic and atramatic Neck:  No JVD.  Lungs: Clear bilaterally to auscultation and percussion. Heart: HRRR . Normal S1 and S2 without gallops or murmurs.  Abdomen: Bowel sounds are positive, abdomen soft and non-tender  Msk:  Back normal, normal gait. Normal strength and tone for age. Extremities: No clubbing, cyanosis or edema.   Neuro: Alert and oriented X 3. Psych:  Good affect, responds appropriately   LABS: Basic Metabolic Panel: Recent Labs    04/18/22 0419 04/19/22 0603 04/20/22 0544  NA 134* 137 136  K 3.8 3.5 4.3  CL 104 106 108  CO2 '24 24 23  '$ GLUCOSE 124* 115* 95  BUN '22 22 19  '$ CREATININE 2.04* 1.71* 1.50*  CALCIUM 8.7* 8.3* 8.5*  MG 2.0  --  1.7   Liver Function Tests: Recent Labs    04/19/22 0603 04/20/22 0544  AST 228* 331*  ALT 263* 403*  ALKPHOS 66 75  BILITOT 0.6 0.6  PROT 4.8* 5.2*  ALBUMIN 2.7* 3.0*   No results for input(s): "LIPASE", "AMYLASE" in the last 72 hours. CBC: Recent Labs    04/20/22 0544  WBC 7.9  HGB 9.8*  HCT 29.9*  MCV 89.8  PLT 145*   Cardiac Enzymes: No results for input(s): "CKTOTAL", "CKMB", "CKMBINDEX", "TROPONINI" in the last 72 hours. BNP: Invalid input(s):  "POCBNP" D-Dimer: Recent Labs    04/20/22 0802  DDIMER 0.47   Hemoglobin A1C: No results for input(s): "HGBA1C" in the last 72 hours. Fasting Lipid Panel: No results for input(s): "CHOL", "HDL", "LDLCALC", "TRIG", "CHOLHDL", "LDLDIRECT" in the last 72 hours. Thyroid Function Tests: No results for input(s): "TSH", "T4TOTAL", "T3FREE", "THYROIDAB" in the last 72 hours.  Invalid input(s): "FREET3" Anemia Panel: No results for input(s): "VITAMINB12", "FOLATE", "FERRITIN", "TIBC", "IRON", "RETICCTPCT" in the last 72 hours.  DG Chest Port 1 View  Result Date: 04/20/2022 CLINICAL DATA:  Shortness of breath EXAM: PORTABLE CHEST 1 VIEW COMPARISON:  04/10/2022 FINDINGS: Dual-chamber ICD leads from the left in stable position. Unchanged heart size and mediastinal contours. Mild interstitial prominence but no Kerley lines, effusion, or air bronchogram. No pneumothorax. IMPRESSION: Borderline vascular congestion. Electronically Signed   By: Jorje Guild M.D.   On: 04/20/2022 06:35     Echo moderately depressed left ventricular function EF around 35 to 40%  TELEMETRY: Normal sinus rhythm nonspecific ST-T wave changes evidence of atrially paced beats:  ASSESSMENT AND PLAN:  Principal Problem:   Near syncope Active Problems:   Paroxysmal atrial fibrillation ([name])   Essential hypertension   Hyperlipidemia   CAD (coronary artery disease)  CKD (chronic kidney disease), stage IIIb   NSVT (nonsustained ventricular tachycardia) ([name])   COPD (chronic obstructive pulmonary disease) ([name])   Hyperkalemia   Abnormal LFTs   Chronic systolic CHF (congestive heart failure) ([name])   Normocytic anemia   Nausea & vomiting   Orthostatic hypotension   Postural dizziness with near syncope   Hyponatremia   Dyspnea   Transaminitis    Plan Agree with midodrine for mild lightheaded dizziness Hypertension Ischemic cardiomyopathy continue current management no evidence of active ischemia Continue  current therapy for cardiomyopathy Inhalers necessary for COPD Continue Eliquis for anticoagulation for his atrial fibrillation Gentle hydration for renal insufficiency Maintain Lipitor therapy for hyperlipidemia AICD in place for arrhythmias and cardiomyopathy no necessary changes at this point I indicated Maintain mexiletine in addition to his amiodarone for multiple arrhythmias supraventricular as well as ventricular I see no clear evidence of cardiogenic shock at this stage I am more concerned about some dehydration with generalized weakness and fatigue Transaminitis I do not believe related to poor perfusion we will consider reducing or discontinuing amiodarone and mexiletine if LFTs continue to rise Is frequent arrhythmia are not concerning for me at this stage I think this is chronic and he requires both antiarrhythmics amiodarone and mexiletine for mild suppression I do not recommend any invasive procedures at this point from a cardiac standpoint   Yolonda Kida, MD 04/20/2022 2:02 PM

## 2022-04-20 NOTE — Progress Notes (Signed)
Pt c/o "feeling like I can't breathe." Pt noted to be pale with labored breathing. Pt lung sounds diminished. VSS. Pt placed on 2L O2 via nasal cannula for comfort. EKG showing SR with 1st degree AV block. Hospitalist notified and orders placed.

## 2022-04-20 NOTE — Consult Note (Signed)
Cephas Darby, MD [address]  [place]  [place], [place] [address]  Main: [phone]  Fax: [phone] Pager: [phone]   Consultation  Referring Provider:     No ref. provider found Primary Care Physician:  Maryland Pink, MD Primary Gastroenterologist:  Dr. Alice Reichert Reason for Consultation: Intractable nausea vomiting, elevated LFTs  Date of Admission:  04/17/2022 Date of Consultation:  04/20/2022         HPI:   Matthew Zamora is a 81 y.o. 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  Patient reports that prior to hospitalization, he was having 1 week episodes of nausea and vomiting, nonbloody, nonbilious emesis.  He said that he has been doing well until this morning when he started experiencing dry heaving only.  He had 3 meals yesterday including dinner and tolerated it well.  He denies any abdominal pain, but reports constipation, received enema since he got admitted, having bowel movements.  He is on Zofran 4 mg every 8 hours as needed and Protonix 40 mg daily.  He is taking omeprazole 20 mg daily as outpatient as per his medication list.  He denies any heartburn. Patient is also found to have progressively worsening LFTs, predominantly transaminases.  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 pylori infection    History of hepatitis    Hyperlipidemia    Hypertension    Liver cyst 05/16/15   PAF (paroxysmal atrial fibrillation) ([place] [place] [facility])    Prostate CA ([place])     Past Surgical History:  Procedure Laterality Date   CATARACT EXTRACTION     COLONOSCOPY  10/06/2014, 09/18/2004, 03/14/2009   ESOPHAGOGASTRODUODENOSCOPY  10/06/2014, 03/18/2001, 03/14/2009   ESOPHAGOGASTRODUODENOSCOPY (EGD) WITH PROPOFOL N/A 05/07/2018   Procedure: ESOPHAGOGASTRODUODENOSCOPY (EGD) WITH PROPOFOL;  Surgeon: Toledo, [name], MD;  Location: [facility] ENDOSCOPY;  Service: Gastroenterology;  Laterality: N/A;   FLEXIBLE SIGMOIDOSCOPY  08/26/1990   INSERTION OF ICD     PROSTATE SURGERY     TONSILLECTOMY       Current Facility-Administered Medications:    acetaminophen (TYLENOL) tablet 650 mg, 650 mg, Oral, Q6H PRN, Nicole Kindred A, DO, 650 mg at 04/18/22 1024   albuterol (VENTOLIN HFA) 108 (90 Base) MCG/ACT inhaler 2 puff, 2 puff, Inhalation, Q4H PRN, Ivor Costa, MD, 2 puff at 04/20/22 0546   amiodarone (PACERONE) tablet 400 mg, 400 mg, Oral, Daily, Ivor Costa, MD, 400 mg at 04/20/22 0813   apixaban (ELIQUIS) tablet 2.5 mg, 2.5 mg, Oral, BID, Ivor Costa, MD, 2.5 mg at 04/20/22 1829   ascorbic acid (VITAMIN C) tablet 500 mg, 500 mg, Oral, Daily, Ivor Costa, MD, 500 mg at 04/20/22 0813   aspirin chewable tablet 81 mg, 81 mg, Oral, Q1200, Ivor Costa, MD, 81 mg at 04/20/22 1151   dextromethorphan-guaiFENesin ([place] DM) 30-600 MG per 12  hr tablet 1 tablet, 1 tablet, Oral, BID PRN, Ivor Costa, MD, 1 tablet at 04/19/22 2204   gabapentin (NEURONTIN) capsule 100 mg, 100 mg, Oral, QHS, Ivor Costa, MD, 100 mg at 04/19/22 2202   hydrALAZINE (APRESOLINE) injection 5 mg, 5 mg, Intravenous, Q2H PRN, Ivor Costa, MD   magnesium oxide (MAG-OX) tablet 400 mg, 400 mg, Oral, Daily, Ivor Costa, MD, 400 mg at 04/20/22 6294   magnesium sulfate IVPB 2 g 50 mL, 2 g, Intravenous, Once, Nicole Kindred A, DO   melatonin  tablet 10 mg, 10 mg, Oral, QHS, Coulter, Carolyn, RPH, 10 mg at 04/19/22 2202   metoprolol tartrate (LOPRESSOR) injection 5 mg, 5 mg, Intravenous, Q4H PRN, Nicole Kindred A, DO   mexiletine (MEXITIL) capsule 150 mg, 150 mg, Oral, BID, Ivor Costa, MD, 150 mg at 04/20/22 0814   midodrine (PROAMATINE) tablet 5 mg, 5 mg, Oral, TID WC, Nicole Kindred A, DO, 5 mg at 04/20/22 1151   mometasone-formoterol (DULERA) 200-5 MCG/ACT inhaler 2 puff, 2 puff, Inhalation, BID, Ivor Costa, MD, 2 puff at 04/20/22 0816   multivitamin with minerals tablet 1 tablet, 1 tablet, Oral, Daily, Ivor Costa, MD, 1 tablet at 04/20/22 0815   ondansetron (ZOFRAN) injection 4 mg, 4 mg, Intravenous, Q6H, Ravin Denardo, Tally Due, MD   pantoprazole (PROTONIX) injection 40 mg, 40 mg, Intravenous, Q12H, Kaymen Adrian, Tally Due, MD   polyethylene glycol (MIRALAX / GLYCOLAX) packet 17 g, 17 g, Oral, Daily, Foust, Katy L, NP, 17 g at 04/20/22 0811   sertraline (ZOLOFT) tablet 25 mg, 25 mg, Oral, Daily, Ivor Costa, MD, 25 mg at 04/20/22 0813   tamsulosin (FLOMAX) capsule 0.4 mg, 0.4 mg, Oral, Daily, Ivor Costa, MD, 0.4 mg at 04/20/22 7654   Family History  Problem Relation Age of Onset   Heart attack Mother    Heart attack Father      Social History   Tobacco Use   Smoking status: Former    Packs/day: 1.00    Years: 38.00    Total pack years: 38.00    Types: Cigarettes    Quit date: 06/18/1999    Years since quitting: 22.8   Smokeless tobacco: Never  Vaping Use   Vaping Use: Never used  Substance Use Topics   Alcohol use: No    Alcohol/week: 0.0 standard drinks of alcohol   Drug use: Not Currently    Allergies as of 04/17/2022 - Review Complete 04/10/2022  Allergen Reaction Noted   Nitrofuran derivatives Other (See Comments) 12/10/2021   Spironolactone  02/12/2022    Review of Systems:    All systems reviewed and negative except where noted in HPI.   Physical Exam:  Vital signs in last 24 hours: Temp:  [97.9 F (36.6  C)-98.7 F (37.1 C)] 98 F (36.7 C) (11/04 1205) Pulse Rate:  [61-106] 106 (11/04 1205) Resp:  [16-20] 18 (11/04 1205) BP: (101-132)/(56-73) 101/64 (11/04 1205) SpO2:  [95 %-100 %] 100 % (11/04 1205) Weight:  [66.4 kg] 66.4 kg (11/04 0500) Last BM Date : 04/20/22 General:   Pleasant, cooperative in NAD Head:  Normocephalic and atraumatic. Eyes:   No icterus.   Conjunctiva pink. PERRLA. Ears:  Normal auditory acuity. Neck:  Supple; no masses or thyroidomegaly Lungs: Respirations even and unlabored. Lungs clear to auscultation bilaterally.   No wheezes, crackles, or rhonchi.  Heart:  Regular rate and rhythm;  Without murmur, clicks, rubs or gallops Abdomen:  Soft, nondistended, nontender. Normal bowel sounds. No appreciable masses or hepatomegaly.  No  rebound or guarding.  Rectal:  Not performed. Msk:  Symmetrical without gross deformities.  Strength generalized weakness Extremities:  Without edema, cyanosis or clubbing. Neurologic:  Alert and oriented x3;  grossly normal neurologically. Skin:  Intact without significant lesions or rashes. Psych:  Alert and cooperative. Normal affect.  LAB RESULTS:    Latest Ref Rng & Units 04/20/2022    5:44 AM 04/17/2022   11:05 AM 04/10/2022    2:47 PM  CBC  WBC 4.0 - 10.5 K/uL 7.9  9.1  5.6   Hemoglobin 13.0 - 17.0 g/dL 9.8  10.7  10.8   Hematocrit 39.0 - 52.0 % 29.9  32.7  33.8   Platelets 150 - 400 K/uL 145  133  168     BMET    Latest Ref Rng & Units 04/20/2022    5:44 AM 04/19/2022    6:03 AM 04/18/2022    4:19 AM  BMP  Glucose 70 - 99 mg/dL 95  115  124   BUN 8 - 23 mg/dL _0 Creatinine 0.61 - 1.24 mg/dL 1.50  1.71  2.04   Sodium 135 - 145 mmol/L 136  137  134   Potassium 3.5 - 5.1 mmol/L 4.3  3.5  3.8   Chloride 98 - 111 mmol/L 108  106  104   CO2 22 - 32 mmol/L _1 Calcium 8.9 - 10.3 mg/dL 8.5  8.3  8.7     LFT    Latest Ref Rng & Units 04/20/2022    5:44 AM 04/19/2022    6:03 AM 04/18/2022    4:19 AM   Hepatic Function  Total Protein 6.5 - 8.1 g/dL 5.2  4.8  5.4   Albumin 3.5 - 5.0 g/dL 3.0  2.7  3.1   AST 15 - 41 U/L 331  228  120   ALT 0 - 44 U/L 403  263  151   Alk Phosphatase 38 - 126 U/L 75  66  73   Total Bilirubin 0.3 - 1.2 mg/dL 0.6  0.6  0.5   Bilirubin, Direct 0.0 - 0.2 mg/dL   0.2      STUDIES: DG Chest Port 1 View  Result Date: 04/20/2022 CLINICAL DATA:  Shortness of breath EXAM: PORTABLE CHEST 1 VIEW COMPARISON:  04/10/2022 FINDINGS: Dual-chamber ICD leads from the left in stable position. Unchanged heart size and mediastinal contours. Mild interstitial prominence but no Kerley lines, effusion, or air bronchogram. No pneumothorax. IMPRESSION: Borderline vascular congestion. Electronically Signed   By: Jorje Guild M.D.   On: 04/20/2022 06:35      Impression / Plan:   TERRYN REDNER is a 81 y.o. male with history of ischemic cardiomyopathy EF of 35 to 40%, AICD, paroxysmal A-fib on Eliquis, chronic kidney disease, medium size hiatal hernia is admitted with lightheadedness secondary to dehydration in setting of nausea and vomiting  Nausea and vomiting Likely secondary to medium size hiatal hernia leading to worsening of reflux and regurgitation, differentials include erosive esophagitis or erosive gastritis or Lysbeth Galas erosions/ulcers Switch to Protonix 40 mg IV twice daily Scheduled dose of Zofran 4 mg every 6 hours Switch to full liquid diet only Small frequent meals long-term Follow antireflux measures, patient has to sit upright at least for 1 to 2 hours after each meal to prevent regurgitation If symptoms do not improve in next 1 to 2 days, recommend upper endoscopy for further evaluation.  Patient is  on Eliquis currently, has to be held for 2 to 3 days in order to proceed with upper endoscopy  Acute hepatitis/transaminitis Most likely secondary to congestive hepatopathy or ischemic hepatitis Acute viral hepatitis panel negative Ultrasound liver revealed  large liver cyst which is stable compared to previous imaging.  This should not cause transaminitis   Thank you for involving me in the care of this patient.  GI will follow along with you    LOS: 2 days   Sherri Sear, MD  04/20/2022, 1:38 PM    Note: This dictation was prepared with Dragon dictation along with smaller phrase technology. Any transcriptional errors that result from this process are unintentional.

## 2022-04-20 NOTE — Assessment & Plan Note (Addendum)
Acute respiratory failure with hypoxia likely due to pneumonia and CHF/fluid overload. Pneumonia has since resolved. --Supplement O2 if sats < 90% or PRN for comfort -- Has required intermittent gentle diuresis, very fluid sensitive --Currently stable on 2 L/min which appears baseline

## 2022-04-20 NOTE — Progress Notes (Signed)
Notified via CCMD change in rhythm, atrial paced 1st degree heart block. Requested review of strip. Strip reviewed. MD, Darlyne Russian,  notified.

## 2022-04-21 ENCOUNTER — Inpatient Hospital Stay
Admit: 2022-04-21 | Discharge: 2022-04-21 | Disposition: A | Discharge status: Home / Self Care | Payer: Medicare Other | Attending: Internal Medicine | Admitting: Internal Medicine

## 2022-04-21 DIAGNOSIS — R42 Dizziness and giddiness: Secondary | ICD-10-CM

## 2022-04-21 DIAGNOSIS — R55 Syncope and collapse: Secondary | ICD-10-CM | POA: Diagnosis not present

## 2022-04-21 DIAGNOSIS — R112 Nausea with vomiting, unspecified: Secondary | ICD-10-CM

## 2022-04-21 DIAGNOSIS — I5022 Chronic systolic (congestive) heart failure: Secondary | ICD-10-CM | POA: Diagnosis not present

## 2022-04-21 DIAGNOSIS — I4729 Other ventricular tachycardia: Secondary | ICD-10-CM | POA: Diagnosis not present

## 2022-04-21 DIAGNOSIS — R7989 Other specified abnormal findings of blood chemistry: Secondary | ICD-10-CM | POA: Diagnosis not present

## 2022-04-21 LAB — COMPREHENSIVE METABOLIC PANEL
ALT: 527 U/L — ABNORMAL HIGH (ref 0–44)
AST: 411 U/L — ABNORMAL HIGH (ref 15–41)
Albumin: 2.8 g/dL — ABNORMAL LOW (ref 3.5–5.0)
Alkaline Phosphatase: 78 U/L (ref 38–126)
Anion gap: 6 (ref 5–15)
BUN: 18 mg/dL (ref 8–23)
CO2: 24 mmol/L (ref 22–32)
Calcium: 8.6 mg/dL — ABNORMAL LOW (ref 8.9–10.3)
Chloride: 107 mmol/L (ref 98–111)
Creatinine, Ser: 1.48 mg/dL — ABNORMAL HIGH (ref 0.61–1.24)
GFR, Estimated: 48 mL/min — ABNORMAL LOW (ref >60)
Glucose, Bld: 97 mg/dL (ref 70–99)
Potassium: 4.2 mmol/L (ref 3.5–5.1)
Sodium: 137 mmol/L (ref 135–145)
Total Bilirubin: 1.3 mg/dL — ABNORMAL HIGH (ref 0.3–1.2)
Total Protein: 5.3 g/dL — ABNORMAL LOW (ref 6.5–8.1)

## 2022-04-21 LAB — ECHOCARDIOGRAM COMPLETE
AR max vel: 1.47 cm2
AV Peak grad: 9.2 mmHg
Ao pk vel: 1.52 m/s
Area-P 1/2: 4.71 cm2
Height: 68 in
S' Lateral: 4.5 cm
Single Plane A4C EF: 32.4 %
Weight: 2342.17 oz

## 2022-04-21 LAB — PHOSPHORUS: Phosphorus: 3.1 mg/dL (ref 2.5–4.6)

## 2022-04-21 LAB — MAGNESIUM: Magnesium: 1.9 mg/dL (ref 1.7–2.4)

## 2022-04-21 MED ORDER — AMIODARONE HCL 200 MG PO TABS
200.0000 mg | ORAL_TABLET | Freq: Every day | ORAL | Status: DC
  Administered 2022-04-21 – 2022-05-07 (×17): 200 mg via ORAL
  Filled 2022-04-21 (×17): qty 1

## 2022-04-21 MED ORDER — SUCRALFATE 1 GM/10ML PO SUSP
1.0000 g | Freq: Three times a day (TID) | ORAL | Status: DC
  Administered 2022-04-21 – 2022-05-13 (×85): 1 g via ORAL
  Filled 2022-04-21 (×86): qty 10

## 2022-04-21 MED ORDER — FUROSEMIDE 10 MG/ML IJ SOLN
20.0000 mg | Freq: Once | INTRAMUSCULAR | Status: AC
  Administered 2022-04-21: 20 mg via INTRAVENOUS
  Filled 2022-04-21: qty 2

## 2022-04-21 MED ORDER — SODIUM CHLORIDE 0.9 % IV SOLN
12.5000 mg | Freq: Four times a day (QID) | INTRAVENOUS | Status: DC | PRN
  Administered 2022-04-21: 12.5 mg via INTRAVENOUS
  Filled 2022-04-21: qty 12.5

## 2022-04-21 NOTE — Assessment & Plan Note (Signed)
See abnormal LFTs

## 2022-04-21 NOTE — Progress Notes (Signed)
  Echocardiogram 2D Echocardiogram has been performed.  Claretta Fraise 04/21/2022, 10:48 AM

## 2022-04-21 NOTE — Assessment & Plan Note (Addendum)
Remains ongoing challenge

## 2022-04-21 NOTE — Progress Notes (Addendum)
Progress Note   Patient: Matthew Zamora TKZ:[id number] DOB: 07-16-1940 DOA: 04/17/2022     3 DOS: the patient was seen and examined on 04/21/2022   Brief hospital course: Matthew Zamora is a 81 y.o. male with medical history significant of hypertension, hyperlipidemia, COPD, APF on Eliquis, CHF with EF of 35-40%, CAD, anemia, CKD-3B, frequent fall, who presented on 04/17/2022 for evaluation of dizziness and lightheadedness, generalized weakness, near syncopal episodes.  This occurred in the setting of about one week of GI illness with nausea and vomiting.  In the ED, afebrile and stable vitals.   Labs notable for K 5.5, no leukocytosis, normal lactic acid x 2, AST 129, ALT 136, Tbili 1.4.  BNP mildly elevated 354.5, troponin x 2 normal, negative for Covid-19 and flu A/B.  Admitted to medicine service for further evaluation and management.    Pt later reported severe constipation at home, with associated lower abdominal pain. Improved after enema and having BM's.  11/2: pt with positive orthostatic vitals with severe symptoms of dizziness, unable to tolerate being upright.  Remains with poor PO intake, started on IV fluids.   11/3--5: ongoing N/V, dizziness. GI and cardiology consulted. IV fluids stopped 11/4 due to dyspnea and cxr with mild vascular congestion.  Eliquis held for possible EGD.    Assessment and Plan: * Near syncope Most likely due to dehydration in the setting of 1 week nausea vomiting.  Orthostatic vitals positive.  Patient appears dry on exam as well. -- Treated with IV fluids, stopped on morning of 11/4 due to dyspnea and signs of mild volume overload on labs and x-ray --Daily orthostatic vitals --Monitor on telemetry --Supportive care for GI illness as outlined   Dyspnea Overnight 11/3-4, pt reported feeling short of breath. Has been on IV fluids for dehydration, orthostatic hypotension.  CXR obtained, showed borderline vascular congestions. --Stop IV  fluids --Defer Lasix given hypotension, can give low dose if BP will tolerate --Monitor closely --Supplement O2 if sats < 90% or PRN for comfort  Nausea & vomiting Patient reported 1 week history of nausea vomiting at home, leading to dehydration and near syncopal episodes with dizziness lightheadedness. Family report N/V issues frequently for several months, chronic issue. -- IV fluids - stopped 11/4 AM due to dyspnea, likely mild volume overload --IV antiemetics as needed --Monitor renal function and electrolytes --GI consulted for further evaluation given persistence --On full liquid diet for now, advance per GI --Possible EGD in 2-3 days if not improved --Hold Eliquis  NSVT (nonsustained ventricular tachycardia) [name] Central telemetry reported brief run of V-fib followed by nonsustained VT today (11/3). Patient has AICD in place. --Consult cardiology. --On amiodarone, mexiletine. --There is drug interaction with Amio increasing effects of mexiletine, side effects of mexiletine are similar patient's presenting symptoms. --Appreciate cardiology recommendations. --Monitor on telemetry. --Maintain K above 4, mag above 2  Orthostatic hypotension With PT today, patient was extremely dizzy and lightheaded.  Supine BP 109/80, seated BP 75/50 (repeat 80/63), could not tolerating standing position. --Treated IV fluids, stopped 11/4 due to dyspnea and mild vascular congestion on CXR --Daily orthostatic vitals --Fall precautions --Will start midodrine since persistent despite adequate IV hydration  Abnormal LFTs On admission AST 129, ALT 136, total bili 1.4. RUQ ultrasound showed normal gallbladder and CBD, stable complex cyst in the right lobe of liver.  Suspect due to acute GI illness. Acute hepatitis panel negative. 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 -- Monitor LFTs daily --GI consulted given persistent N/V of unclear cause  Hyperkalemia K was 5.5 on admission, now  normalized.   CKD (chronic kidney disease), stage IIIb Baseline creatinine 1.7-1.8.  Renal function on admission was at baseline. Creatinine up slightly today 2.04, suspect due to dehydration given GI illness and orthostatic hypotension. -- Started IV fluids, stopped 11/4 AM --Daily BMP to monitor --Avoid nephrotoxins, hypotension --Renally dose meds  Chronic systolic CHF (congestive heart failure) ([name]) On admission, hypovolemic and dehydrated. Diuretics on hold.  Was on IV fluids. Still appears overall euvolemic, but increasing dyspnea. CXR with borderline vascular congestion and BNP 764 up from 300's. May be developing mild decompensation. --Hold Lasix until BP will tolerate --Started on midodrine to help BP --Daily weights --Monitor closely --Cardiology following  Paroxysmal atrial fibrillation ([name]) Rate controlled. --Continue amiodarone, Mexitil --Oral metoprolol now on hold due to hypotension --Eliquis on hold for possible EGD --On telemetry --Lopressor IV PRN for HR sustained above 110  Essential hypertension Hold metoprolol given soft BPs and severe orthostatic symptoms Diuretic on hold IV hydralazine as needed   Hyperlipidemia Lipitor on hold due to elevated LFTs  CAD (coronary artery disease) Stable, no active chest pain. Continue aspirin, metoprolol. Lipitor on hold due to elevated LFTs.  COPD (chronic obstructive pulmonary disease) ([name]) Stable without wheezing. Continue bronchodilators  Normocytic anemia Hemoglobin stable 10.8, 10.7, 9.8. Monitor CBC  Transaminitis See abnormal LFTs  Hyponatremia Likely hypovolemic in setting of N/V and dehydration. Resolved.  Postural dizziness with near syncope Most likely due to orthostatic hypotension. Given nausea vomiting, vertigo is in the differential but feel is less likely. -- Trial as needed meclizine ordered         Subjective: Patient was sleeping very soundly with an emesis bag next to  him when seen on rounds this morning.  Briefly, reported being very tired and quickly drifted back to sleep.  No other acute complaints or acute events reported.  He continues to improve with severe dizziness and nausea vomiting.  Physical Exam: Vitals:   04/21/22 0031 04/21/22 0657 04/21/22 0803 04/21/22 1135  BP: (!) 144/76 138/63 111/79 (!) 140/60  Pulse: 91 84 86 86  Resp: '18 18 18 20  '$ Temp: 99 F (37.2 C) 97.9 F (36.6 C) 98.8 F (37.1 C) 98.4 F (36.9 C)  TempSrc: Oral Oral    SpO2: 92% 96% 98% 95%  Weight:      Height:       General exam: Sleeping soundly woke briefly to voice and tactile stimulus, no acute distress Respiratory system: Normal respiratory effort, rhonchi versus referred upper airway sounds Cardiovascular system: RRR, no peripheral edema.   Gastrointestinal system: soft, NT, ND Central nervous system: Limited exam due to somnolence Extremities: moves all, no edema, normal tone Skin: Improved skin turgor, patchy ecchymosis on bilateral extremities, normal temperature Psychiatry: Limited exam due to somnolence   Labs and data Reviewed: Notable labs --- Cr 1.48, K4.2, Mg 1.9 AST 411 up from 331, ALT 527 up from 4 3, total bili 1.3 up from 0.6  Echocardiogram is pending    Family Communication: updated son and daughter-in-law by phone on 11/4. Son present at bedside on rounds, will attempt to call.   Disposition: Status is: Inpatient Remains inpatient appropriate because: severe orthostatic dizziness, on IV fluids, not tolerating adequate PO intake, ongoing evaluation, cardiac dysrhythmias with medication changes and cardiology consult pending.      Planned Discharge Destination: Home    Time spent: 40 minutes  Author: Ezekiel Slocumb, DO 04/21/2022 11:36 AM  For on  call review [email or web address].

## 2022-04-21 NOTE — TOC Progression Note (Signed)
Transition of Care [facility]) - Progression Note    Patient Details  Name: Matthew Zamora MRN: [id number] Date of Birth: 04-02-1941  [facility]) CM/SW Contact  Zigmund Daniel Dorian Pod, RN Phone Number:[phone] 04/21/2022, 3:52 PM  Clinical Narrative:    Unsuccessful with pt however TOC RN left voice message with son Matthew Zamora to initiate SNF recommendations.  TOC team will continue outreaches and attempt to complete TOC assessment.        Expected Discharge Plan and Services                                                 Social Determinants of Health (SDOH) Interventions    Readmission Risk Interventions     No data to display

## 2022-04-21 NOTE — Progress Notes (Signed)
Cephas Darby, MD [address]  [place]  [place], [place] [address]  Main: [phone]  Fax: [phone] Pager: [phone]   Subjective: Patient has persistent nausea and vomiting, has loss of voice since yesterday which is new.  Patient is oldest son is bedside.  According to him, he has intermittent episodes of nausea and vomiting for last 6 months, has been losing weight gradually.   Objective: Vital signs in last 24 hours: Vitals:   04/21/22 0031 04/21/22 0657 04/21/22 0803 04/21/22 1135  BP: (!) 144/76 138/63 111/79 (!) 140/60  Pulse: 91 84 86 86  Resp: '18 18 18 20  '$ Temp: 99 F (37.2 C) 97.9 F (36.6 C) 98.8 F (37.1 C) 98.4 F (36.9 C)  TempSrc: Oral Oral    SpO2: 92% 96% 98% 95%  Weight:      Height:       Weight change:   Intake/Output Summary (Last 24 hours) at 04/21/2022 1644 Last data filed at 04/21/2022 1533 Gross per 24 hour  Intake 49.62 ml  Output 900 ml  Net -850.38 ml     Exam: Heart:: Regular rate and rhythm, S1S2 present, or without murmur or extra heart sounds Lungs: normal and clear to auscultation Abdomen: soft, nontender, normal bowel sounds   Lab Results:    Latest Ref Rng & Units 04/20/2022    5:44 AM 04/17/2022   11:05 AM 04/10/2022    2:47 PM  CBC  WBC 4.0 - 10.5 K/uL 7.9  9.1  5.6   Hemoglobin 13.0 - 17.0 g/dL 9.8  10.7  10.8   Hematocrit 39.0 - 52.0 % 29.9  32.7  33.8   Platelets 150 - 400 K/uL 145  133  168       Latest Ref Rng & Units 04/21/2022    5:48 AM 04/20/2022    5:44 AM 04/19/2022    6:03 AM  CMP  Glucose 70 - 99 mg/dL 97  95  115   BUN 8 - 23 mg/dL '18  19  22   '$ Creatinine 0.61 - 1.24 mg/dL 1.48  1.50  1.71   Sodium 135 - 145 mmol/L 137  136  137   Potassium 3.5 - 5.1 mmol/L 4.2  4.3  3.5   Chloride 98 - 111 mmol/L 107  108  106   CO2 22 - 32 mmol/L '24  23  24   '$ Calcium 8.9 - 10.3 mg/dL 8.6  8.5  8.3   Total Protein 6.5 - 8.1 g/dL 5.3  5.2  4.8   Total Bilirubin 0.3 - 1.2 mg/dL 1.3  0.6  0.6    Alkaline Phos 38 - 126 U/L 78  75  66   AST 15 - 41 U/L 411  331  228   ALT 0 - 44 U/L 527  403  263     Micro Results: Recent Results (from the past 240 hour(s))  Resp Panel by RT-PCR (Flu A&B, Covid) Anterior Nasal Swab     Status: None   Collection Time: 04/17/22 11:05 AM   Specimen: Anterior Nasal Swab  Result Value Ref Range Status   SARS Coronavirus 2 by RT PCR NEGATIVE NEGATIVE Final    Comment: (NOTE) SARS-CoV-2 target nucleic acids are NOT DETECTED.  The SARS-CoV-2 RNA is generally detectable in upper respiratory specimens during the acute phase of infection. The lowest concentration of SARS-CoV-2 viral copies this assay can detect is 138 copies/mL. A negative result does not preclude SARS-Cov-2 infection and  should not be used as the sole basis for treatment or other patient management decisions. A negative result may occur with  improper specimen collection/handling, submission of specimen other than nasopharyngeal swab, presence of viral mutation(s) within the areas targeted by this assay, and inadequate number of viral copies(<138 copies/mL). A negative result must be combined with clinical observations, patient history, and epidemiological information. The expected result is Negative.  Fact Sheet for Patients:  EntrepreneurPulse.com.au  Fact Sheet for Healthcare Providers:  IncredibleEmployment.be  This test is no t yet approved or cleared by the [place] FDA and  has been authorized for detection and/or diagnosis of SARS-CoV-2 by FDA under an Emergency Use Authorization (EUA). This EUA will remain  in effect (meaning this test can be used) for the duration of the COVID-19 declaration under Section 564(b)(1) of the Act, 21 U.S.C.section 360bbb-3(b)(1), unless the authorization is terminated  or revoked sooner.       Influenza A by PCR NEGATIVE NEGATIVE Final   Influenza B by PCR NEGATIVE NEGATIVE Final    Comment:  (NOTE) The Xpert Xpress SARS-CoV-2/FLU/RSV plus assay is intended as an aid in the diagnosis of influenza from Nasopharyngeal swab specimens and should not be used as a sole basis for treatment. Nasal washings and aspirates are unacceptable for Xpert Xpress SARS-CoV-2/FLU/RSV testing.  Fact Sheet for Patients: EntrepreneurPulse.com.au  Fact Sheet for Healthcare Providers: IncredibleEmployment.be  This test is not yet approved or cleared by the [place] FDA and has been authorized for detection and/or diagnosis of SARS-CoV-2 by FDA under an Emergency Use Authorization (EUA). This EUA will remain in effect (meaning this test can be used) for the duration of the COVID-19 declaration under Section 564(b)(1) of the Act, 21 U.S.C. section 360bbb-3(b)(1), unless the authorization is terminated or revoked.  Performed at [facility], [address] [place], [place] [address]    Studies/Results: ECHOCARDIOGRAM COMPLETE  Result Date: 04/21/2022    ECHOCARDIOGRAM REPORT   Patient Name:   LONNIE RETH Date of Exam: 04/21/2022 Medical Rec #:  [id number]           Height:       68.0 in Accession #:    [id number]          Weight:       146.4 lb Date of Birth:  Jan 26, 1941           BSA:          1.790 m Patient Age:    2 years            BP:           138/63 mmHg Patient Gender: M                   HR:           82 bpm. Exam Location:  [facility] Procedure: 2D Echo Indications:     CHF I50.21  History:         Patient has no prior history of Echocardiogram examinations.  Sonographer:     Kathlen Brunswick RDCS Referring Phys:  [id number] Claiborne Billings A GRIFFITH Diagnosing Phys: Yolonda Kida MD  Sonographer Comments: Technically difficult study due to poor echo windows and suboptimal apical window. The patient coughed throughout this exam, making image acquisition challenging. IMPRESSIONS  1. Left ventricular ejection fraction, by estimation, is 30 to 35%. The  left ventricle has moderately decreased function. The left ventricle demonstrates global hypokinesis. The left ventricular internal cavity size  was mildly to moderately dilated. Left ventricular diastolic parameters are consistent with Grade III diastolic dysfunction (restrictive).  2. Right ventricular systolic function is low normal. The right ventricular size is mildly enlarged.  3. Left atrial size was moderately dilated.  4. Right atrial size was mild to moderately dilated.  5. The mitral valve is normal in structure. Trivial mitral valve regurgitation.  6. The aortic valve is normal in structure. Aortic valve regurgitation is not visualized. Aortic valve sclerosis/calcification is present, without any evidence of aortic stenosis. FINDINGS  Left Ventricle: Left ventricular ejection fraction, by estimation, is 30 to 35%. The left ventricle has moderately decreased function. The left ventricle demonstrates global hypokinesis. The left ventricular internal cavity size was mildly to moderately  dilated. There is no left ventricular hypertrophy. Left ventricular diastolic parameters are consistent with Grade III diastolic dysfunction (restrictive). Right Ventricle: The right ventricular size is mildly enlarged. No increase in right ventricular wall thickness. Right ventricular systolic function is low normal. Left Atrium: Left atrial size was moderately dilated. Right Atrium: Right atrial size was mild to moderately dilated. Pericardium: There is no evidence of pericardial effusion. Mitral Valve: The mitral valve is normal in structure. Trivial mitral valve regurgitation. Tricuspid Valve: The tricuspid valve is normal in structure. Tricuspid valve regurgitation is mild. Aortic Valve: The aortic valve is normal in structure. Aortic valve regurgitation is not visualized. Aortic valve sclerosis/calcification is present, without any evidence of aortic stenosis. Aortic valve peak gradient measures 9.2 mmHg. Pulmonic  Valve: The pulmonic valve was normal in structure. Pulmonic valve regurgitation is not visualized. Aorta: The ascending aorta was not well visualized. IAS/Shunts: No atrial level shunt detected by color flow Doppler. Additional Comments: A device lead is visualized.  LEFT VENTRICLE PLAX 2D LVIDd:         5.10 cm     Diastology LVIDs:         4.50 cm     LV e' medial:    5.33 cm/s LV PW:         1.00 cm     LV E/e' medial:  23.2 LV IVS:        1.00 cm     LV e' lateral:   11.16 cm/s LVOT diam:     1.80 cm     LV E/e' lateral: 11.1 LV SV:         38 LV SV Index:   21 LVOT Area:     2.54 cm  LV Volumes (MOD) LV vol d, MOD A4C: 94.7 ml LV vol s, MOD A4C: 64.0 ml LV SV MOD A4C:     94.7 ml RIGHT VENTRICLE RV Basal diam:  3.20 cm RV S prime:     11.30 cm/s TAPSE (M-mode): 2.5 cm LEFT ATRIUM           Index        RIGHT ATRIUM          Index LA diam:      5.20 cm 2.91 cm/m   RA Area:     8.76 cm LA Vol (A2C): 33.3 ml 18.60 ml/m  RA Volume:   18.30 ml 10.22 ml/m LA Vol (A4C): 43.0 ml 24.02 ml/m  AORTIC VALVE                 PULMONIC VALVE AV Area (Vmax): 1.47 cm     PV Vmax:       0.87 m/s AV Vmax:        152.00  cm/s  PV Peak grad:  3.0 mmHg AV Peak Grad:   9.2 mmHg LVOT Vmax:      87.75 cm/s LVOT Vmean:     55.800 cm/s LVOT VTI:       0.149 m  AORTA Ao Root diam: 3.10 cm MITRAL VALVE                TRICUSPID VALVE MV Area (PHT): 4.71 cm     TR Peak grad:   46.2 mmHg MV Decel Time: 161 msec     TR Vmax:        340.00 cm/s MV E velocity: 123.50 cm/s MV A velocity: 58.40 cm/s   SHUNTS MV E/A ratio:  2.11         Systemic VTI:  0.15 m                             Systemic Diam: 1.80 cm Yolonda Kida MD Electronically signed by Yolonda Kida MD Signature Date/Time: 04/21/2022/12:41:53 PM    Final    DG Chest Port 1 View  Result Date: 04/20/2022 CLINICAL DATA:  Shortness of breath EXAM: PORTABLE CHEST 1 VIEW COMPARISON:  04/10/2022 FINDINGS: Dual-chamber ICD leads from the left in stable position. Unchanged  heart size and mediastinal contours. Mild interstitial prominence but no Kerley lines, effusion, or air bronchogram. No pneumothorax. IMPRESSION: Borderline vascular congestion. Electronically Signed   By: Jorje Guild M.D.   On: 04/20/2022 06:35   Medications: I have reviewed the patient's current medications. Prior to Admission:  Medications Prior to Admission  Medication Sig Dispense Refill Last Dose   albuterol (VENTOLIN HFA) 108 (90 Base) MCG/ACT inhaler Inhale 2 puffs into the lungs every 4 (four) hours as needed.   04/17/2022 at AM   amiodarone (PACERONE) 400 MG tablet Take 1 tablet (400 mg total) by mouth daily.   04/16/2022   ascorbic acid (VITAMIN C) 500 MG tablet Take 1 tablet by mouth daily.   04/16/2022   aspirin 81 MG chewable tablet Chew 81 mg by mouth daily at 12 noon.   04/16/2022   atorvastatin (LIPITOR) 80 MG tablet Take 80 mg by mouth daily.   04/16/2022 at NIGHT   ELIQUIS 2.5 MG TABS tablet Take 2.5 mg by mouth 2 (two) times daily.   04/16/2022 at NIGHT   fluticasone-salmeterol (ADVAIR) 500-50 MCG/ACT AEPB Inhale 1 puff into the lungs in the morning and at bedtime.   04/16/2022 at AM   gabapentin (NEURONTIN) 100 MG capsule Take 100 mg by mouth at bedtime.   04/16/2022 at NIGHT   magnesium oxide (MAG-OX) 400 (240 Mg) MG tablet Take 1 tablet by mouth daily.   04/16/2022 at AM   melatonin 3 MG TABS tablet Take 9 mg by mouth at bedtime.   04/16/2022 at NIGHT   metoprolol succinate (TOPROL-XL) 25 MG 24 hr tablet Take 1 tablet (25 mg total) by mouth 2 (two) times daily. Take with or immediately following a meal. 60 tablet 0 04/16/2022 at NIGHT   mexiletine (MEXITIL) 150 MG capsule Take 150 mg by mouth 2 (two) times daily.   04/16/2022 at NIGHT   Multiple Vitamin (MULTIVITAMIN WITH MINERALS) TABS tablet Take 1 tablet by mouth daily.   04/16/2022 at AFTERNOON   omeprazole (PRILOSEC) 20 MG capsule Take 20 mg by mouth every morning.   04/16/2022 at AM   ondansetron (ZOFRAN-ODT) 4 MG  disintegrating tablet Take 4 mg by mouth every  8 (eight) hours as needed for refractory nausea / vomiting, vomiting or nausea.   04/16/2022 at AM   sertraline (ZOLOFT) 25 MG tablet Take 25 mg by mouth daily.   04/16/2022 at NIGHT   tamsulosin (FLOMAX) 0.4 MG CAPS capsule Take 0.4 mg by mouth daily.   04/16/2022 at NIGHT   acetaminophen (TYLENOL) 325 MG tablet Take 2 tablets (650 mg total) by mouth every 4 (four) hours as needed for headache or mild pain.   PRN at PRN   albuterol (PROVENTIL) (2.5 MG/3ML) 0.083% nebulizer solution Inhale 3 mLs (2.5 mg total) into the lungs as needed for shortness of breath. 75 mL 12 PRN   benzonatate (TESSALON PERLES) 100 MG capsule Take 1 capsule (100 mg total) by mouth every 6 (six) hours as needed for cough. (Patient not taking: Reported on 04/17/2022) 30 capsule 0 Completed Course   calcium carbonate (TUMS EX) 750 MG chewable tablet Chew 1 tablet by mouth daily.   PRN at PRN   Nebulizer MISC 1 each by Does not apply route as needed. 1 each 0    oxymetazoline (AFRIN) 0.05 % nasal spray Place 2 sprays into both nostrils 2 (two) times daily as needed for congestion (nose bleeds). (Patient not taking: Reported on 04/17/2022) 15 mL 1 Not Taking   tadalafil (CIALIS) 20 MG tablet Take 20 mg by mouth daily.   PRN at PRN   torsemide (DEMADEX) 20 MG tablet Take 1 tablet (20 mg total) by mouth daily as needed. for up to 3 days for increased leg swelling, shortness of breath, weight gain 5+ lbs over 1-2 days. Seek medical care if these symptoms are not improving with increased dose. 30 tablet 0 PRN at PRN   Scheduled:  amiodarone  200 mg Oral Daily   ascorbic acid  500 mg Oral Daily   aspirin  81 mg Oral Q1200   gabapentin  100 mg Oral QHS   magnesium oxide  400 mg Oral Daily   melatonin  10 mg Oral QHS   mexiletine  150 mg Oral BID   midodrine  5 mg Oral TID WC   mometasone-formoterol  2 puff Inhalation BID   multivitamin with minerals  1 tablet Oral Daily   ondansetron  (ZOFRAN) IV  4 mg Intravenous Q6H   pantoprazole (PROTONIX) IV  40 mg Intravenous Q12H   polyethylene glycol  17 g Oral Daily   sertraline  25 mg Oral Daily   tamsulosin  0.4 mg Oral Daily   Continuous:  promethazine (PHENERGAN) injection (IM or IVPB) Stopped (04/21/22 0442)   AOZ:HYQMVHQIONGEX, albuterol, dextromethorphan-guaiFENesin, hydrALAZINE, meclizine, metoprolol tartrate, promethazine (PHENERGAN) injection (IM or IVPB) Anti-infectives (From admission, onward)    None      Scheduled Meds:  amiodarone  200 mg Oral Daily   ascorbic acid  500 mg Oral Daily   aspirin  81 mg Oral Q1200   gabapentin  100 mg Oral QHS   magnesium oxide  400 mg Oral Daily   melatonin  10 mg Oral QHS   mexiletine  150 mg Oral BID   midodrine  5 mg Oral TID WC   mometasone-formoterol  2 puff Inhalation BID   multivitamin with minerals  1 tablet Oral Daily   ondansetron (ZOFRAN) IV  4 mg Intravenous Q6H   pantoprazole (PROTONIX) IV  40 mg Intravenous Q12H   polyethylene glycol  17 g Oral Daily   sertraline  25 mg Oral Daily   tamsulosin  0.4 mg Oral Daily  Continuous Infusions:  promethazine (PHENERGAN) injection (IM or IVPB) Stopped (04/21/22 0442)   PRN Meds:.acetaminophen, albuterol, dextromethorphan-guaiFENesin, hydrALAZINE, meclizine, metoprolol tartrate, promethazine (PHENERGAN) injection (IM or IVPB)   Assessment: Principal Problem:   Near syncope Active Problems:   Paroxysmal atrial fibrillation ([name])   Essential hypertension   Hyperlipidemia   CAD (coronary artery disease)   CKD (chronic kidney disease), stage IIIb   NSVT (nonsustained ventricular tachycardia) ([name])   COPD (chronic obstructive pulmonary disease) ([name])   Hyperkalemia   Abnormal LFTs   Chronic systolic CHF (congestive heart failure) ([name])   Normocytic anemia   Nausea & vomiting   Orthostatic hypotension   Postural dizziness with near syncope   Hyponatremia   Dyspnea   Transaminitis   Dizziness  JAMOND NEELS is a 81 y.o. male with history of ischemic cardiomyopathy EF of 35 to 40%, AICD, paroxysmal A-fib on Eliquis, chronic kidney disease, medium size hiatal hernia is admitted with lightheadedness secondary to dehydration in setting of intractable nausea and vomiting   Plan: Intractable nausea and vomiting with failure to thrive: Worsening of symptoms with hoarseness of voice from frequent retching and vomiting Has medium size hiatal hernia leading to worsening of reflux and regurgitation, differentials include erosive esophagitis or erosive gastritis or Cameron erosions/ulcers or Candida esophagitis or esophageal cancer Continue Protonix 40 mg IV twice daily Carafate suspension 1 g 3-4 times daily Scheduled dose of Zofran 4 mg every 6 hours Switch to clear liquid diet only Eliquis has been held since yesterday We will proceed with upper endoscopy tomorrow due to worsening of symptoms N.p.o. effective midnight   Acute hepatitis/transaminitis: LFTs are worsening No evidence of acute liver failure Most likely secondary to congestive hepatopathy or ischemic hepatitis Acute viral hepatitis panel negative Ultrasound liver revealed large liver cyst which is stable compared to previous imaging.  This should not cause transaminitis Avoid hepatotoxic agents Monitor LFTs daily, recommend adequate hydration  Dr. Allen Norris will cover from tomorrow   LOS: 3 days   Jennilee Demarco 04/21/2022, 4:44 PM

## 2022-04-21 NOTE — Progress Notes (Signed)
[facility]    SUBJECTIVE: Patient feeling slightly better but still has some lightheaded dizziness weakness poor appetite no fever chills or sweats no headaches   Vitals:   04/21/22 0031 04/21/22 0657 04/21/22 0803 04/21/22 1135  BP: (!) 144/76 138/63 111/79 (!) 140/60  Pulse: 91 84 86 86  Resp: '18 18 18 20  '$ Temp: 99 F (37.2 C) 97.9 F (36.6 C) 98.8 F (37.1 C) 98.4 F (36.9 C)  TempSrc: Oral Oral    SpO2: 92% 96% 98% 95%  Weight:      Height:         Intake/Output Summary (Last 24 hours) at 04/21/2022 1337 Last data filed at 04/21/2022 1030 Gross per 24 hour  Intake 89.23 ml  Output 500 ml  Net -410.77 ml      PHYSICAL EXAM  General: Well developed, well nourished, in no acute distress HEENT:  Normocephalic and atramatic Neck:  No JVD.  Lungs: Clear bilaterally to auscultation and percussion. Heart: HRRR . Normal S1 and S2 without gallops or murmurs.  Abdomen: Bowel sounds are positive, abdomen soft and non-tender  Msk:  Back normal, normal gait. Normal strength and tone for age. Extremities: No clubbing, cyanosis or edema.   Neuro: Alert and oriented X 3. Psych:  Good affect, responds appropriately   LABS: Basic Metabolic Panel: Recent Labs    04/20/22 0544 04/21/22 0548  NA 136 137  K 4.3 4.2  CL 108 107  CO2 23 24  GLUCOSE 95 97  BUN 19 18  CREATININE 1.50* 1.48*  CALCIUM 8.5* 8.6*  MG 1.7 1.9  PHOS  --  3.1   Liver Function Tests: Recent Labs    04/20/22 0544 04/21/22 0548  AST 331* 411*  ALT 403* 527*  ALKPHOS 75 78  BILITOT 0.6 1.3*  PROT 5.2* 5.3*  ALBUMIN 3.0* 2.8*   No results for input(s): "LIPASE", "AMYLASE" in the last 72 hours. CBC: Recent Labs    04/20/22 0544  WBC 7.9  HGB 9.8*  HCT 29.9*  MCV 89.8  PLT 145*   Cardiac Enzymes: No results for input(s): "CKTOTAL", "CKMB", "CKMBINDEX", "TROPONINI" in the last 72 hours. BNP: Invalid input(s): "POCBNP" D-Dimer: Recent Labs    04/20/22 0802  DDIMER 0.47    Hemoglobin A1C: No results for input(s): "HGBA1C" in the last 72 hours. Fasting Lipid Panel: No results for input(s): "CHOL", "HDL", "LDLCALC", "TRIG", "CHOLHDL", "LDLDIRECT" in the last 72 hours. Thyroid Function Tests: No results for input(s): "TSH", "T4TOTAL", "T3FREE", "THYROIDAB" in the last 72 hours.  Invalid input(s): "FREET3" Anemia Panel: No results for input(s): "VITAMINB12", "FOLATE", "FERRITIN", "TIBC", "IRON", "RETICCTPCT" in the last 72 hours.  ECHOCARDIOGRAM COMPLETE  Result Date: 04/21/2022    ECHOCARDIOGRAM REPORT   Patient Name:   Matthew Zamora Date of Exam: 04/21/2022 Medical Rec #:  [id number]           Height:       68.0 in Accession #:    [id number]          Weight:       146.4 lb Date of Birth:  07/09/1940           BSA:          1.790 m Patient Age:    81 years            BP:           138/63 mmHg Patient Gender: M  HR:           82 bpm. Exam Location:  [facility] Procedure: 2D Echo Indications:     CHF I50.21  History:         Patient has no prior history of Echocardiogram examinations.  Sonographer:     Kathlen Brunswick RDCS Referring Phys:  [id number] Claiborne Billings A GRIFFITH Diagnosing Phys: Yolonda Kida MD  Sonographer Comments: Technically difficult study due to poor echo windows and suboptimal apical window. The patient coughed throughout this exam, making image acquisition challenging. IMPRESSIONS  1. Left ventricular ejection fraction, by estimation, is 30 to 35%. The left ventricle has moderately decreased function. The left ventricle demonstrates global hypokinesis. The left ventricular internal cavity size was mildly to moderately dilated. Left ventricular diastolic parameters are consistent with Grade III diastolic dysfunction (restrictive).  2. Right ventricular systolic function is low normal. The right ventricular size is mildly enlarged.  3. Left atrial size was moderately dilated.  4. Right atrial size was mild to moderately dilated.  5. The mitral  valve is normal in structure. Trivial mitral valve regurgitation.  6. The aortic valve is normal in structure. Aortic valve regurgitation is not visualized. Aortic valve sclerosis/calcification is present, without any evidence of aortic stenosis. FINDINGS  Left Ventricle: Left ventricular ejection fraction, by estimation, is 30 to 35%. The left ventricle has moderately decreased function. The left ventricle demonstrates global hypokinesis. The left ventricular internal cavity size was mildly to moderately  dilated. There is no left ventricular hypertrophy. Left ventricular diastolic parameters are consistent with Grade III diastolic dysfunction (restrictive). Right Ventricle: The right ventricular size is mildly enlarged. No increase in right ventricular wall thickness. Right ventricular systolic function is low normal. Left Atrium: Left atrial size was moderately dilated. Right Atrium: Right atrial size was mild to moderately dilated. Pericardium: There is no evidence of pericardial effusion. Mitral Valve: The mitral valve is normal in structure. Trivial mitral valve regurgitation. Tricuspid Valve: The tricuspid valve is normal in structure. Tricuspid valve regurgitation is mild. Aortic Valve: The aortic valve is normal in structure. Aortic valve regurgitation is not visualized. Aortic valve sclerosis/calcification is present, without any evidence of aortic stenosis. Aortic valve peak gradient measures 9.2 mmHg. Pulmonic Valve: The pulmonic valve was normal in structure. Pulmonic valve regurgitation is not visualized. Aorta: The ascending aorta was not well visualized. IAS/Shunts: No atrial level shunt detected by color flow Doppler. Additional Comments: A device lead is visualized.  LEFT VENTRICLE PLAX 2D LVIDd:         5.10 cm     Diastology LVIDs:         4.50 cm     LV e' medial:    5.33 cm/s LV PW:         1.00 cm     LV E/e' medial:  23.2 LV IVS:        1.00 cm     LV e' lateral:   11.16 cm/s LVOT diam:      1.80 cm     LV E/e' lateral: 11.1 LV SV:         38 LV SV Index:   21 LVOT Area:     2.54 cm  LV Volumes (MOD) LV vol d, MOD A4C: 94.7 ml LV vol s, MOD A4C: 64.0 ml LV SV MOD A4C:     94.7 ml RIGHT VENTRICLE RV Basal diam:  3.20 cm RV S prime:     11.30 cm/s TAPSE (M-mode): 2.5 cm LEFT  ATRIUM           Index        RIGHT ATRIUM          Index LA diam:      5.20 cm 2.91 cm/m   RA Area:     8.76 cm LA Vol (A2C): 33.3 ml 18.60 ml/m  RA Volume:   18.30 ml 10.22 ml/m LA Vol (A4C): 43.0 ml 24.02 ml/m  AORTIC VALVE                 PULMONIC VALVE AV Area (Vmax): 1.47 cm     PV Vmax:       0.87 m/s AV Vmax:        152.00 cm/s  PV Peak grad:  3.0 mmHg AV Peak Grad:   9.2 mmHg LVOT Vmax:      87.75 cm/s LVOT Vmean:     55.800 cm/s LVOT VTI:       0.149 m  AORTA Ao Root diam: 3.10 cm MITRAL VALVE                TRICUSPID VALVE MV Area (PHT): 4.71 cm     TR Peak grad:   46.2 mmHg MV Decel Time: 161 msec     TR Vmax:        340.00 cm/s MV E velocity: 123.50 cm/s MV A velocity: 58.40 cm/s   SHUNTS MV E/A ratio:  2.11         Systemic VTI:  0.15 m                             Systemic Diam: 1.80 cm Yolonda Kida MD Electronically signed by Yolonda Kida MD Signature Date/Time: 04/21/2022/12:41:53 PM    Final    DG Chest Port 1 View  Result Date: 04/20/2022 CLINICAL DATA:  Shortness of breath EXAM: PORTABLE CHEST 1 VIEW COMPARISON:  04/10/2022 FINDINGS: Dual-chamber ICD leads from the left in stable position. Unchanged heart size and mediastinal contours. Mild interstitial prominence but no Kerley lines, effusion, or air bronchogram. No pneumothorax. IMPRESSION: Borderline vascular congestion. Electronically Signed   By: Jorje Guild M.D.   On: 04/20/2022 06:35     Echo moderate reduced left ventricular function EF around 30 to 35%  TELEMETRY: Normal sinus rhythm rate of 85 with interventricular conduction delay nonspecific findings:  ASSESSMENT AND PLAN:  Principal Problem:   Near syncope Active  Problems:   Paroxysmal atrial fibrillation ([name])   Essential hypertension   Hyperlipidemia   CAD (coronary artery disease)   CKD (chronic kidney disease), stage IIIb   NSVT (nonsustained ventricular tachycardia) ([name])   COPD (chronic obstructive pulmonary disease) ([name])   Hyperkalemia   Abnormal LFTs   Chronic systolic CHF (congestive heart failure) ([name])   Normocytic anemia   Nausea & vomiting   Orthostatic hypotension   Postural dizziness with near syncope   Hyponatremia   Dyspnea   Transaminitis   Dizziness    Plan -Syncope unclear etiology possibly from dehydration orthostatics lightheaded dizziness we will try to reduce mexiletine but cardiologist lowest possible dose of 150 twice a day we will reduce amiodarone to 200 mg a day Nausea vomiting unclear etiology chronic recurrent agree with GI evaluation and management continue to hold Eliquis consider reducing or discontinuing mexiletine Ectopy nonsustained VT patient currently on amiodarone and mexiletine consider beta-blocker therapy if blood pressure tolerates Orthostatic hypotension with dizziness vertigo relative hypotension recommend  continue modest fluid hydration Abnormal LFTs mildly elevated negative work-up so far by imaging continue GI work-up and evaluation disease reduce amiodarone Recommend nephrology input for renal insufficiency modest IV hydration avoid nephrotoxic drugs 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

## 2022-04-22 ENCOUNTER — Encounter: Payer: Self-pay | Admitting: Internal Medicine

## 2022-04-22 ENCOUNTER — Inpatient Hospital Stay: Payer: Medicare Other | Admitting: Anesthesiology

## 2022-04-22 ENCOUNTER — Encounter
Admission: EM | Disposition: A | Discharge status: Skilled Nursing Facility | Payer: Self-pay | Source: Home / Self Care | Attending: Internal Medicine

## 2022-04-22 DIAGNOSIS — K2210 Ulcer of esophagus without bleeding: Secondary | ICD-10-CM

## 2022-04-22 DIAGNOSIS — R55 Syncope and collapse: Secondary | ICD-10-CM | POA: Diagnosis not present

## 2022-04-22 DIAGNOSIS — R112 Nausea with vomiting, unspecified: Secondary | ICD-10-CM | POA: Diagnosis not present

## 2022-04-22 DIAGNOSIS — I951 Orthostatic hypotension: Secondary | ICD-10-CM | POA: Diagnosis not present

## 2022-04-22 HISTORY — PX: ESOPHAGOGASTRODUODENOSCOPY (EGD) WITH PROPOFOL: SHX5813

## 2022-04-22 LAB — COMPREHENSIVE METABOLIC PANEL
ALT: 604 U/L — ABNORMAL HIGH (ref 0–44)
AST: 459 U/L — ABNORMAL HIGH (ref 15–41)
Albumin: 2.8 g/dL — ABNORMAL LOW (ref 3.5–5.0)
Alkaline Phosphatase: 72 U/L (ref 38–126)
Anion gap: 7 (ref 5–15)
BUN: 23 mg/dL (ref 8–23)
CO2: 26 mmol/L (ref 22–32)
Calcium: 8.5 mg/dL — ABNORMAL LOW (ref 8.9–10.3)
Chloride: 103 mmol/L (ref 98–111)
Creatinine, Ser: 1.49 mg/dL — ABNORMAL HIGH (ref 0.61–1.24)
GFR, Estimated: 47 mL/min — ABNORMAL LOW (ref >60)
Glucose, Bld: 69 mg/dL — ABNORMAL LOW (ref 70–99)
Potassium: 3.9 mmol/L (ref 3.5–5.1)
Sodium: 136 mmol/L (ref 135–145)
Total Bilirubin: 1.8 mg/dL — ABNORMAL HIGH (ref 0.3–1.2)
Total Protein: 5.2 g/dL — ABNORMAL LOW (ref 6.5–8.1)

## 2022-04-22 LAB — MAGNESIUM: Magnesium: 1.8 mg/dL (ref 1.7–2.4)

## 2022-04-22 LAB — GLUCOSE, CAPILLARY: Glucose-Capillary: 92 mg/dL (ref 70–99)

## 2022-04-22 SURGERY — ESOPHAGOGASTRODUODENOSCOPY (EGD) WITH PROPOFOL
Anesthesia: General

## 2022-04-22 MED ORDER — PROPOFOL 500 MG/50ML IV EMUL
INTRAVENOUS | Status: DC | PRN
  Administered 2022-04-22: 125 ug/kg/min via INTRAVENOUS

## 2022-04-22 MED ORDER — BOOST / RESOURCE BREEZE PO LIQD CUSTOM
1.0000 | Freq: Three times a day (TID) | ORAL | Status: DC
  Administered 2022-04-22 – 2022-05-11 (×28): 1 via ORAL

## 2022-04-22 MED ORDER — SODIUM CHLORIDE 0.9 % IV SOLN: INTRAVENOUS | Status: DC

## 2022-04-22 MED ORDER — APIXABAN 2.5 MG PO TABS
2.5000 mg | ORAL_TABLET | Freq: Two times a day (BID) | ORAL | Status: DC
  Administered 2022-04-23 – 2022-05-12 (×38): 2.5 mg via ORAL
  Filled 2022-04-22 (×39): qty 1

## 2022-04-22 MED ORDER — PANTOPRAZOLE SODIUM 40 MG PO TBEC
40.0000 mg | DELAYED_RELEASE_TABLET | Freq: Two times a day (BID) | ORAL | Status: DC
  Administered 2022-04-22 – 2022-05-13 (×41): 40 mg via ORAL
  Filled 2022-04-22 (×42): qty 1

## 2022-04-22 MED ORDER — PHENYLEPHRINE HCL (PRESSORS) 10 MG/ML IV SOLN
INTRAVENOUS | Status: DC | PRN
  Administered 2022-04-22 (×2): 160 ug via INTRAVENOUS

## 2022-04-22 MED ORDER — LIDOCAINE HCL (CARDIAC) PF 100 MG/5ML IV SOSY
PREFILLED_SYRINGE | INTRAVENOUS | Status: DC | PRN
  Administered 2022-04-22: 40 mg via INTRAVENOUS

## 2022-04-22 MED ORDER — PROPOFOL 10 MG/ML IV BOLUS
INTRAVENOUS | Status: DC | PRN
  Administered 2022-04-22: 60 mg via INTRAVENOUS

## 2022-04-22 NOTE — Progress Notes (Signed)
OT Cancellation Note  Patient Details Name: [name] MRN: [id number] DOB: 03/30/1941   Cancelled Treatment:    Reason Eval/Treat Not Completed: Patient at procedure or test/ unavailable . Pt currently off the unit for endoscopy. OT will re-attempt when pt is next available.   Darleen Crocker, [place], OTR/L , CBIS ascom [phone]  04/22/22, 12:20 PM

## 2022-04-22 NOTE — Anesthesia Procedure Notes (Signed)
Date/Time: 04/22/2022 11:57 AM  Performed by: Doreen Salvage, CRNAPre-anesthesia Checklist: Patient identified, Emergency Drugs available, Suction available and Patient being monitored Patient Re-evaluated:Patient Re-evaluated prior to induction Oxygen Delivery Method: Simple face mask Induction Type: IV induction Dental Injury: Teeth and Oropharynx as per pre-operative assessment

## 2022-04-22 NOTE — Anesthesia Postprocedure Evaluation (Signed)
Anesthesia Post Note  Patient: Matthew Zamora  Procedure(s) Performed: ESOPHAGOGASTRODUODENOSCOPY (EGD) WITH PROPOFOL  Patient location during evaluation: Endoscopy Anesthesia Type: General Level of consciousness: awake and alert Pain management: pain level controlled Vital Signs Assessment: post-procedure vital signs reviewed and stable Respiratory status: spontaneous breathing, nonlabored ventilation, respiratory function stable and patient connected to nasal cannula oxygen Cardiovascular status: blood pressure returned to baseline and stable Postop Assessment: no apparent nausea or vomiting Anesthetic complications: no   No notable events documented.   Last Vitals:  Vitals:   04/22/22 1222 04/22/22 1232  BP: (!) 95/56 (!) 105/55  Pulse: 89 90  Resp: (!) 25 19  Temp:    SpO2: 100% 97%    Last Pain:  Vitals:   04/22/22 1232  TempSrc:   PainSc: 0-No pain                 [name]

## 2022-04-22 NOTE — Assessment & Plan Note (Addendum)
EGD on 11/6 notable for LA grade D esophagitis with no bleeding seen. --Biopsy path reviewed, benign --Continue PPI BID indefinitely -- Continue Carafate --Follow up in GI clinic as an outpatient

## 2022-04-22 NOTE — Assessment & Plan Note (Signed)
See Near Syncope

## 2022-04-22 NOTE — Progress Notes (Signed)
OT Cancellation Note  Patient Details Name: Matthew Zamora MRN: [id number] DOB: Dec 08, 1940   Cancelled Treatment:     Pt off floor at upper endoscopy procedure. Continue PT per POC next available date/time.  [name] 04/22/2022, 11:35 AM

## 2022-04-22 NOTE — Progress Notes (Signed)
Physical Therapy Treatment Patient Details Name: Matthew Zamora MRN: [id number] DOB: [birth year] Today's Date: 04/22/2022   History of Present Illness Pt is an 81 year old male presenting to ED with lightheadedness; PMH significant for  hypertension, hyperlipidemia, COPD, APF on Eliquis, CHF with EF of 35-40%, CAD, anemia, CKD-3B, and frequent falls.    PT Comments    Pt was pleasant and motivated to participate during the session and put forth good effort throughout. Pt required min A with all functional tasks this session and again presented with orthostatic hypotension but only mildly symptomatic.  Pt's orthostatic BPs as follows: Supine: 116/65, HR 88; Sitting: 106/65, HR 91, Standing: 73/59, HR 95.  Pt able to take several effortful side-steps towards the [facility] prior to returning to sitting. Pt reported only minimal lightheadedness in standing and no other adverse symptoms during the session.  Pt remains at an elevated risk for falls and will benefit from PT services in a SNF setting upon discharge to safely address deficits listed in patient problem list for decreased caregiver assistance and eventual return to PLOF.      Recommendations for follow up therapy are one component of a multi-disciplinary discharge planning process, led by the attending physician.  Recommendations may be updated based on patient status, additional functional criteria and insurance authorization.  Follow Up Recommendations  Skilled nursing-short term rehab (<3 hours/day) Can patient physically be transported by private vehicle: No   Assistance Recommended at Discharge Frequent or constant Supervision/Assistance  Patient can return home with the following A little help with walking and/or transfers;A little help with bathing/dressing/bathroom;Assist for transportation;Help with stairs or ramp for entrance;Assistance with cooking/housework   Equipment Recommendations  Other (comment) (TBD at next venue of  care)    Recommendations for Other Services       Precautions / Restrictions Precautions Precautions: Fall Precaution Comments: orthostatic Restrictions Weight Bearing Restrictions: No     Mobility  Bed Mobility Overal bed mobility: Needs Assistance       Supine to sit: Min assist Sit to supine: Supervision   General bed mobility comments: Min A for BLE and trunk control during sup to sit, extra time and effort with sit to sup    Transfers Overall transfer level: Needs assistance Equipment used: Rolling walker (2 wheels) Transfers: Sit to/from Stand Sit to Stand: Min assist           General transfer comment: Min verbal cues for sequencing    Ambulation/Gait Ambulation/Gait assistance: Herbalist (Feet): 2 Feet Assistive device: Rolling walker (2 wheels) Gait Pattern/deviations: Step-to pattern, Decreased step length - right, Decreased step length - left, Trunk flexed, Shuffle Gait velocity: decreased     General Gait Details: Pt able to take several shuffling side-steps toward the [facility] with flexed trunk and heavy lean on the RW for support   Stairs             Wheelchair Mobility    Modified Rankin (Stroke Patients Only)       Balance Overall balance assessment: History of Falls, Needs assistance Sitting-balance support: Feet supported, Bilateral upper extremity supported Sitting balance-Leahy Scale: Fair     Standing balance support: Reliant on assistive device for balance, Bilateral upper extremity supported, During functional activity Standing balance-Leahy Scale: Poor                              Cognition Arousal/Alertness: Awake/alert Behavior During Therapy: [facility]  for tasks assessed/performed Overall Cognitive Status: Within Functional Limits for tasks assessed                                          Exercises Total Joint Exercises Ankle Circles/Pumps: AROM, Strengthening, Both, 10  reps Quad Sets: Strengthening, Both, 10 reps Gluteal Sets: Strengthening, Both, 10 reps Long Arc Quad: AROM, Strengthening, Both, 10 reps Other Exercises Other Exercises: HEP education for BLE APs, QS, and GS x 10-15 each every 1-2 hours daily    General Comments        Pertinent Vitals/Pain Pain Assessment Pain Assessment: No/denies pain    Home Living                          Prior Function            PT Goals (current goals can now be found in the care plan section) Progress towards PT goals: PT to reassess next treatment    Frequency    Min 2X/week      PT Plan Current plan remains appropriate    Co-evaluation              AM-PAC PT "6 Clicks" Mobility   Outcome Measure  Help needed turning from your back to your side while in a flat bed without using bedrails?: A Little Help needed moving from lying on your back to sitting on the side of a flat bed without using bedrails?: A Little Help needed moving to and from a bed to a chair (including a wheelchair)?: A Little Help needed standing up from a chair using your arms (e.g., wheelchair or bedside chair)?: A Little Help needed to walk in hospital room?: A Lot Help needed climbing 3-5 steps with a railing? : Total 6 Click Score: 15    End of Session Equipment Utilized During Treatment: Gait belt Activity Tolerance: Other (comment) (limited by orthostatic hypotension) Patient left: in bed;with call bell/phone within reach;with bed alarm set;with family/visitor present Nurse Communication: Mobility status;Other (comment) (orthostatic BP results) PT Visit Diagnosis: Unsteadiness on feet (R26.81);Muscle weakness (generalized) (M62.81);History of falling (Z91.81)     Time: 1856-3149 PT Time Calculation (min) (ACUTE ONLY): 34 min  Charges:  $Therapeutic Exercise: 8-22 mins $Therapeutic Activity: 8-22 mins                     D. Royetta Asal PT, DPT 04/22/22, 5:38 PM

## 2022-04-22 NOTE — Assessment & Plan Note (Signed)
Chronic x several months. See Nausea & Vomiting, Erosive esophagitis EGD on 11/5 as above Follow up with GI as outpatient.

## 2022-04-22 NOTE — Transfer of Care (Signed)
Immediate Anesthesia Transfer of Care Note  Patient: Matthew Zamora  Procedure(s) Performed: ESOPHAGOGASTRODUODENOSCOPY (EGD) WITH PROPOFOL  Patient Location: PACU and Endoscopy Unit  Anesthesia Type:General  Level of Consciousness: drowsy  Airway & Oxygen Therapy: Patient Spontanous Breathing and Patient connected to face mask  Post-op Assessment: Report given to RN and Post -op Vital signs reviewed and stable  Post vital signs: Reviewed and stable  Last Vitals:  Vitals Value Taken Time  BP 105/57 04/22/22 1215  Temp 37.3 C 04/22/22 1214  Pulse 90 04/22/22 1219  Resp 23 04/22/22 1219  SpO2 100 % 04/22/22 1219  Vitals shown include unvalidated device data.  Last Pain:  Vitals:   04/22/22 1214  TempSrc: Temporal  PainSc: 0-No pain         Complications: No notable events documented.

## 2022-04-22 NOTE — Op Note (Signed)
[facility] Gastroenterology Patient Name: Matthew Zamora Procedure Date: 04/22/2022 11:34 AM MRN: [id number] Account #: [id number] Date of Birth: July 23, 1940 Admit Type: Outpatient Age: 81 Room: [facility] ENDO ROOM 4 Gender: Male Note Status: Finalized Instrument Name: Upper Endoscope [id number] Procedure:             Upper GI endoscopy Indications:           Anorexia, Nausea with vomiting Providers:             Lin Landsman MD, MD Medicines:             General Anesthesia Complications:         No immediate complications. Estimated blood loss: None. Procedure:             Pre-Anesthesia Assessment:                        - Prior to the procedure, a History and Physical was                         performed, and patient medications and allergies were                         reviewed. The patient is competent. The risks and                         benefits of the procedure and the sedation options and                         risks were discussed with the patient. All questions                         were answered and informed consent was obtained.                         Patient identification and proposed procedure were                         verified by the physician, the nurse, the                         anesthesiologist, the anesthetist and the technician                         in the pre-procedure area in the procedure room in the                         endoscopy suite. Mental Status Examination: alert and                         oriented. Airway Examination: normal oropharyngeal                         airway and neck mobility. Respiratory Examination:                         clear to auscultation. CV Examination: normal.                         Prophylactic  Antibiotics: The patient does not require                         prophylactic antibiotics. Prior Anticoagulants: The                         patient has taken no anticoagulant or antiplatelet                          agents. ASA Grade Assessment: III - A patient with                         severe systemic disease. After reviewing the risks and                         benefits, the patient was deemed in satisfactory                         condition to undergo the procedure. The anesthesia                         plan was to use general anesthesia. Immediately prior                         to administration of medications, the patient was                         re-assessed for adequacy to receive sedatives. The                         heart rate, respiratory rate, oxygen saturations,                         blood pressure, adequacy of pulmonary ventilation, and                         response to care were monitored throughout the                         procedure. The physical status of the patient was                         re-assessed after the procedure.                        After obtaining informed consent, the endoscope was                         passed under direct vision. Throughout the procedure,                         the patient's blood pressure, pulse, and oxygen                         saturations were monitored continuously. The Endoscope                         was introduced through the mouth, and advanced to the  second part of duodenum. The upper GI endoscopy was                         accomplished without difficulty. The patient tolerated                         the procedure well. Findings:      A single 8 mm submucosal nodule with a localized distribution was found       in the second portion of the duodenum. Biopsies were taken with a cold       forceps for histology. Estimated blood loss: none.      The entire examined stomach was normal. Biopsies were taken with a cold       forceps for histology.      The cardia and gastric fundus were normal on retroflexion.      A small hiatal hernia was present.      LA Grade D (one or more  mucosal breaks involving at least 75% of       esophageal circumference) esophagitis with no bleeding was found in the       lower third of the esophagus extending to middle esophagus. Biopsies       were taken with a cold forceps for histology.      Esophagogastric landmarks were identified: the gastroesophageal junction       was found at 36 cm from the incisors. Impression:            - Submucosal nodule found in the duodenum. Biopsied.                        - Normal stomach. Biopsied.                        - Small hiatal hernia.                        - LA Grade D esophagitis with no bleeding. Biopsied.                        - Esophagogastric landmarks identified. Recommendation:        - Return patient to hospital ward for ongoing care.                        - Advance diet as tolerated today.                        - Continue present medications.                        - Await pathology results.                        - Follow an antireflux regimen indefinitely.                        - Use a proton pump inhibitor PO BID indefinitely.                        - Return to GI clinic in 1 day. Procedure Code(s):     --- Professional ---  10312, Esophagogastroduodenoscopy, flexible,                         transoral; with biopsy, single or multiple Diagnosis Code(s):     --- Professional ---                        K31.89, Other diseases of stomach and duodenum                        K44.9, Diaphragmatic hernia without obstruction or                         gangrene                        K20.90, Esophagitis, unspecified without bleeding                        R63.0, Anorexia                        R11.2, Nausea with vomiting, unspecified CPT copyright 2022 American Medical Association. All rights reserved. The codes documented in this report are preliminary and upon coder review may  be revised to meet current compliance requirements. Dr. Ulyess Mort Lin Landsman MD, MD 04/22/2022 12:13:34 PM This report has been signed electronically. Number of Addenda: 0 Note Initiated On: 04/22/2022 11:34 AM Estimated Blood Loss:  Estimated blood loss: none.      [facility]

## 2022-04-22 NOTE — Progress Notes (Addendum)
Progress Note   Patient: Matthew Zamora YTK:[id number] DOB: [birth year] DOA: 04/17/2022     4 DOS: the patient was seen and examined on 04/22/2022   Brief hospital course: SHEMUEL HARKLEROAD is a 81 y.o. male with medical history significant of hypertension, hyperlipidemia, COPD, APF on Eliquis, CHF with EF of 35-40%, CAD, anemia, CKD-3B, frequent fall, who presented on 04/17/2022 for evaluation of dizziness and lightheadedness, generalized weakness, near syncopal episodes.  This occurred in the setting of about one week of GI illness with nausea and vomiting.  In the ED, afebrile and stable vitals.   Labs notable for K 5.5, no leukocytosis, normal lactic acid x 2, AST 129, ALT 136, Tbili 1.4.  BNP mildly elevated 354.5, troponin x 2 normal, negative for Covid-19 and flu A/B.  Admitted to medicine service for further evaluation and management.    Pt later reported severe constipation at home, with associated lower abdominal pain. Improved after enema and having BM's.  11/2: pt with positive orthostatic vitals with severe symptoms of dizziness, unable to tolerate being upright.  Remains with poor PO intake, started on IV fluids.   11/3--5: ongoing N/V, dizziness. GI and cardiology consulted. IV fluids stopped 11/4 due to dyspnea and cxr with mild vascular congestion.  Eliquis held for EGD.  11/6: EGD showed erosive esophagitis, submucosal duodenal nodule - biopsies pending.      Assessment and Plan: * Near syncope Most likely due to dehydration in the setting of 1 week nausea vomiting.  Orthostatic vitals positive.  Patient appears dry on exam as well. -- Treated with IV fluids, stopped on morning of 11/4 due to dyspnea and signs of mild volume overload on labs and x-ray --Daily orthostatic vitals --Monitor on telemetry --Supportive care for GI illness as outlined   Dyspnea Overnight 11/3-4, pt reported feeling short of breath. Has been on IV fluids for dehydration, orthostatic  hypotension.  CXR obtained, showed borderline vascular congestions. --Stopped IV fluids 11/4 --Given 20 mg IV Lasix x 1 on 11/5 due to ongoing dyspnea and coarse crackles --Monitor closely --Supplement O2 if sats < 90% or PRN for comfort  Nausea & vomiting Patient reported 1 week history of nausea vomiting at home, leading to dehydration and near syncopal episodes with dizziness lightheadedness. Family report N/V issues frequently for several months, chronic issue. -- IV fluids - stopped 11/4 AM due to dyspnea, likely mild volume overload --IV antiemetics as needed --Monitor renal function and electrolytes --GI consulted for further evaluation given persistence --On full liquid diet for now, advance per GI --Possible EGD in 2-3 days if not improved --Hold Eliquis  NSVT (nonsustained ventricular tachycardia) ([place]) Central telemetry reported brief run of V-fib followed by nonsustained VT today (11/3). Patient has AICD in place. --Consult cardiology. --On amiodarone, mexiletine. --There is drug interaction with Amio increasing effects of mexiletine, side effects of mexiletine are similar patient's presenting symptoms. --Appreciate cardiology recommendations. --Monitor on telemetry. --Maintain K above 4, mag above 2  Orthostatic hypotension With PT today, patient was extremely dizzy and lightheaded.  Supine BP 109/80, seated BP 75/50 (repeat 80/63), could not tolerating standing position. --Treated IV fluids, stopped 11/4 due to dyspnea and mild vascular congestion on CXR --Daily orthostatic vitals --Fall precautions --Started midodrine since persistent despite adequate IV hydration  Abnormal LFTs On admission AST 129, ALT 136, total bili 1.4. RUQ ultrasound showed normal gallbladder and CBD, stable complex cyst in the right lobe of liver.  Suspect due to acute GI illness, also likely  congestive hepatopathy.  Acute hepatitis panel negative. -- Monitor LFTs daily --GI consulted    Hyperkalemia K was 5.5 on admission, now normalized.   CKD (chronic kidney disease), stage IIIb Baseline creatinine 1.7-1.8.  Renal function on admission was at baseline. Creatinine up slightly today 2.04, suspect due to dehydration given GI illness and orthostatic hypotension. -- Started IV fluids, stopped 11/4 AM --Daily BMP to monitor --Avoid nephrotoxins, hypotension --Renally dose meds  Chronic systolic CHF (congestive heart failure) ([place]) On admission, hypovolemic and dehydrated. Diuretics on hold.  Was on IV fluids. Still appears overall euvolemic, but increasing dyspnea. CXR with borderline vascular congestion and BNP 764 up from 300's. May be developing mild decompensation. --Hold Lasix until BP will tolerate --Started on midodrine to help BP --Daily weights --Monitor closely --Cardiology following  Paroxysmal atrial fibrillation ([place]) Rate controlled. --Continue amiodarone, Mexitil --Oral metoprolol now on hold due to hypotension --Eliquis held for EGD, resume tomorrow AM --On telemetry --Lopressor IV PRN for HR sustained above 110  Essential hypertension Hold metoprolol given soft BPs and severe orthostatic symptoms Diuretic on hold IV hydralazine as needed   Hyperlipidemia Lipitor on hold due to elevated LFTs  CAD (coronary artery disease) Stable, no active chest pain. Continue aspirin, metoprolol. Lipitor on hold due to elevated LFTs.  COPD (chronic obstructive pulmonary disease) ([name]) Stable without wheezing. Continue bronchodilators  Normocytic anemia Hemoglobin stable 10.8, 10.7, 9.8. Monitor CBC  Erosive esophagitis EGD on 11/6 notable for LA grade D esophagitis with no bleeding seen. --Follow biopsy path results --Continue PPI BID indefinitely --Started on Carafate --Follow up in GI clinic --Advance diet as tolerated  Intractable nausea and vomiting Chronic x several months. See Nausea & Vomiting, Erosive esophagitis EGD on 11/5  as above Follow up with GI as outpatient.  Dizziness See Near Syncope  Transaminitis See abnormal LFTs  Hyponatremia Likely hypovolemic in setting of N/V and dehydration. Resolved.  Postural dizziness with near syncope Most likely due to orthostatic hypotension. Given nausea vomiting, vertigo is in the differential but feel is less likely. -- Trial as needed meclizine ordered         Subjective: Patient was awake sitting up in bed when seen today.  He reports nausea better this morning he was not able to eat anything for the past 2 days.  Reports improvement in dizziness today as well.  Going for EGD later this morning.  Physical Exam: Vitals:   04/22/22 1212 04/22/22 1214 04/22/22 1222 04/22/22 1232  BP: (!) 80/52 (!) 105/57 (!) 95/56 (!) 105/55  Pulse:  91 89 90  Resp:  (!) 22 (!) 25 19  Temp:  99.1 F (37.3 C)    TempSrc:  Temporal    SpO2:  100% 100% 97%  Weight:      Height:       General exam: Awake and alert, sitting up in bed, no acute distress HEENT: Temporal wasting, hearing grossly normal Respiratory system: Normal respiratory effort, lungs overall clear with intermittent upper airway secretion sounds Cardiovascular system: RRR, no peripheral edema.   Gastrointestinal system: soft, NT, ND Central nervous system: Alert and oriented, grossly nonfocal exam, normal speech Extremities: moves all, no edema, normal tone Skin: No peripheral edema, patchy ecchymosis on bilateral extremities, normal temperature Psychiatry: Normal mood, congruent affect, judgment and insight appear normal   Labs and data Reviewed: Notable labs --- Cr 1.49 stable, Ca 8.5, Mg 1.8, K 3.9, glucose 69 on BMP, Tbili 1.8 from 1.3, AST 459 rising, ALT 604 rising.  Echocardiogram on 11/5: LVEF 30 to 54%, grade 3 diastolic dysfunction, LV global hypokinesis, low normal RV systolic function    Family Communication: updated son and daughter-in-law by phone on 11/4. Son present at bedside  on rounds, will attempt to call.   Disposition: Status is: Inpatient Remains inpatient appropriate because: severe orthostatic dizziness, on IV fluids, not tolerating adequate PO intake, ongoing evaluation, cardiac dysrhythmias with medication changes.    Discharge pending further clinical improvement, adequate PO intake, clearance by consultants and SNF placement.      Planned Discharge Destination: Home    Time spent: 40 minutes  Author: Ezekiel Slocumb, DO 04/22/2022 12:55 PM  For on call review [email or web address].

## 2022-04-22 NOTE — TOC Initial Note (Signed)
Transition of Care [facility]) - Initial/Assessment Note    Patient Details  Name: Matthew Zamora MRN: [id number] Date of Birth: 1940-10-16  Transition of Care [facility]) CM/SW Contact:    Colen Darling, [place] Phone Number: 04/22/2022, 3:55 PM  Clinical Narrative:                  TOC spoke to Rose Fillers regarding the patient's discharge plan. The patient sees PCP Dr. Maryland Pink. Audelia Acton drives him to appointments. Audelia Acton picks up his medications at [facility] on [address]. He has previously received HHRN and HHPT from [place]. Family is requesting a referral to [facility] in [place].  Expected Discharge Plan: Skilled Nursing Facility Barriers to Discharge: Continued Medical Work up   Patient Goals and CMS Choice Patient states their goals for this hospitalization and ongoing recovery are:: attend rehab CMS Medicare.gov Compare Post Acute Care list provided to:: Patient Represenative (must comment) Rose Fillers)    Expected Discharge Plan and Services Expected Discharge Plan: [place]   Discharge Planning Services: CM Consult                                          Prior Living Arrangements/Services              Need for Family Participation in Patient Care: Yes (Comment) Care giver support system in place?: Yes (comment) Current home services: DME, Home RN, Home PT Criminal Activity/Legal Involvement Pertinent to Current Situation/Hospitalization: No - Comment as needed  Activities of Daily Living Home Assistive Devices/Equipment: Walker (specify type) (four wheel) ADL Screening (condition at time of admission) Patient's cognitive ability adequate to safely complete daily activities?: Yes Is the patient deaf or have difficulty hearing?: No Does the patient have difficulty seeing, even when wearing glasses/contacts?: No Does the patient have difficulty concentrating, remembering, or making decisions?: No Patient  able to express need for assistance with ADLs?: No Does the patient have difficulty dressing or bathing?: No Independently performs ADLs?: Yes (appropriate for developmental age) Does the patient have difficulty walking or climbing stairs?: Yes Weakness of Legs: Both Weakness of Arms/Hands: None  Permission Sought/Granted Permission sought to share information with : Family Supports                Emotional Assessment              Admission diagnosis:  Hyperkalemia [E87.5] Near syncope [R55] Postural dizziness with near syncope [R42, R55] Patient Active Problem List   Diagnosis Date Noted   Erosive esophagitis 04/22/2022   Dizziness 04/21/2022   Intractable nausea and vomiting 04/21/2022   Dyspnea 04/20/2022   Transaminitis 04/20/2022   Hyponatremia 04/19/2022   Nausea & vomiting 04/18/2022   Orthostatic hypotension 04/18/2022   Postural dizziness with near syncope 04/18/2022   Near syncope 04/17/2022   COPD (chronic obstructive pulmonary disease) ([place]) 04/17/2022   Hyperkalemia 04/17/2022   Abnormal LFTs 02/40/9735   Chronic systolic CHF (congestive heart failure) ([place]) 04/17/2022   Normocytic anemia 04/17/2022   GERD without esophagitis 02/12/2022   Recurrent falls    Generalized weakness    NSVT (nonsustained ventricular tachycardia) ([place]) 02/11/2022   Acute on chronic combined systolic (congestive) and diastolic (congestive) heart failure ([place]) 08/10/2021   Acute respiratory failure with hypoxia ([place])    HCAP (healthcare-associated pneumonia) 06/13/2021   Acute on chronic systolic CHF (congestive heart failure) (  [place]) 06/13/2021   Elevated troponin 06/13/2021   CKD (chronic kidney disease), 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   Lung nodule 06/13/2021   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   COPD exacerbation ([place]) 06/13/2021   Volume depletion 12/15/2019   Hypotension 12/15/2019   Cardiorenal syndrome    Atrial fibrillation with RVR ([name])    Defibrillator discharge    Tachycardia  10/25/2019   CHF (congestive heart failure) ([place]) 09/18/2019   Acute on chronic combined systolic and diastolic CHF (congestive heart failure) ([place]) 09/17/2019   CAD (coronary artery disease) 09/17/2019   Ischemic cardiomyopathy 09/17/2019   CKD (chronic kidney disease) stage 3, GFR 30-59 ml/min ([name]) 09/17/2019   Acute hypoxemic respiratory failure ([place]) 09/17/2018   AAA (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 ([place]) 07/06/2018   Essential hypertension 07/06/2018   Hyperlipidemia 07/06/2018   Paroxysmal atrial fibrillation ([place])    Acute on chronic respiratory failure with hypoxia ([place]) 04/26/2018   PCP:  Maryland Pink, MD Pharmacy:   [facility] [address] [place]) [place] [address] Phone: [phone] Fax: [phone]     Social Determinants of Health (SDOH) Interventions    Readmission Risk Interventions    04/22/2022    3:54 PM  Readmission Risk Prevention Plan  Transportation Screening Complete  Medication Review ([place]) Complete  PCP or Specialist appointment within 3-5 days of discharge Complete  HRI or [place] Complete  SW Recovery Care/Counseling Consult Complete  [place] Not Applicable

## 2022-04-22 NOTE — Anesthesia Preprocedure Evaluation (Addendum)
Anesthesia Evaluation  Patient identified by MRN, date of birth, ID band Patient awake    Reviewed: Allergy & Precautions, NPO status , Patient's Chart, lab work & pertinent test results  Airway Mallampati: III  TM Distance: <3 FB Neck ROM: full    Dental  (+) Chipped, Poor Dentition, Missing   Pulmonary shortness of breath, pneumonia, unresolved, COPD, former smoker   + rhonchi  + decreased breath sounds  rales    Cardiovascular hypertension, + CAD and +CHF  + dysrhythmias Atrial Fibrillation      Neuro/Psych negative neurological ROS  negative psych ROS   GI/Hepatic Neg liver ROS,GERD  Controlled,,  Endo/Other  negative endocrine ROS    Renal/GU Renal disease  negative genitourinary   Musculoskeletal   Abdominal   Peds  Hematology negative hematology ROS (+)   Anesthesia Other Findings Past Medical History: 05/13/15: Abdominal aortic aneurysm (AAA) ([place])     Comment:  seen on ct scan 03/18/2001, 03/14/2009, 10/06/2014: Adenomatous colon polyp No date: Anemia 03/18/2001, 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 pylori infection No date: History of hepatitis No date: Hyperlipidemia No date: Hypertension 05/16/15: Liver cyst No date: PAF (paroxysmal atrial fibrillation) ([place]) No date: Prostate CA [facility])  Past Surgical History: No date: CATARACT EXTRACTION 10/06/2014, 09/18/2004, 03/14/2009: COLONOSCOPY 10/06/2014, 03/18/2001, 03/14/2009: ESOPHAGOGASTRODUODENOSCOPY 05/07/2018: ESOPHAGOGASTRODUODENOSCOPY (EGD) WITH PROPOFOL; N/A     Comment:  Procedure: ESOPHAGOGASTRODUODENOSCOPY (EGD) WITH               PROPOFOL;  Surgeon: Toledo, [name], MD;  Location:               [facility] ENDOSCOPY;  Service: Gastroenterology;  Laterality:                N/A; 08/26/1990: FLEXIBLE SIGMOIDOSCOPY No date: INSERTION OF ICD No date: PROSTATE SURGERY No date: TONSILLECTOMY  BMI    Body Mass Index: 22.26 kg/m      Reproductive/Obstetrics negative OB ROS                             Anesthesia Physical Anesthesia Plan  ASA: 3  Anesthesia Plan: General   Post-op Pain Management:    Induction: Intravenous  PONV Risk Score and Plan:   Airway Management Planned: Natural Airway and Nasal Cannula  Additional Equipment:   Intra-op Plan:   Post-operative Plan:   Informed Consent: I have reviewed the patients History and Physical, chart, labs and discussed the procedure including the risks, benefits and alternatives for the proposed anesthesia with the patient or authorized representative who has indicated his/her understanding and acceptance.     Dental Advisory Given  Plan Discussed with: Anesthesiologist, CRNA and Surgeon  Anesthesia Plan Comments: (Patient consented for risks of anesthesia including but not limited to:  - adverse reactions to medications - risk of airway placement if required - damage to eyes, teeth, lips or other oral mucosa - nerve damage due to positioning  - sore throat or hoarseness - Damage to heart, brain, nerves, lungs, other parts of body or loss of life  Patient voiced understanding.)       Anesthesia Quick Evaluation

## 2022-04-23 ENCOUNTER — Inpatient Hospital Stay: Payer: Medicare Other

## 2022-04-23 ENCOUNTER — Encounter: Payer: Self-pay | Admitting: Gastroenterology

## 2022-04-23 DIAGNOSIS — R55 Syncope and collapse: Secondary | ICD-10-CM | POA: Diagnosis not present

## 2022-04-23 DIAGNOSIS — J189 Pneumonia, unspecified organism: Secondary | ICD-10-CM | POA: Clinically undetermined

## 2022-04-23 LAB — CBC
HCT: 29 % — ABNORMAL LOW (ref 39.0–52.0)
Hemoglobin: 9.4 g/dL — ABNORMAL LOW (ref 13.0–17.0)
MCH: 29.1 pg (ref 26.0–34.0)
MCHC: 32.4 g/dL (ref 30.0–36.0)
MCV: 89.8 fL (ref 80.0–100.0)
Platelets: 144 10*3/uL — ABNORMAL LOW (ref 150–400)
RBC: 3.23 MIL/uL — ABNORMAL LOW (ref 4.22–5.81)
RDW: 14.2 % (ref 11.5–15.5)
WBC: 10.3 10*3/uL (ref 4.0–10.5)
nRBC: 0 % (ref 0.0–0.2)

## 2022-04-23 LAB — COMPREHENSIVE METABOLIC PANEL
ALT: 499 U/L — ABNORMAL HIGH (ref 0–44)
AST: 277 U/L — ABNORMAL HIGH (ref 15–41)
Albumin: 2.6 g/dL — ABNORMAL LOW (ref 3.5–5.0)
Alkaline Phosphatase: 72 U/L (ref 38–126)
Anion gap: 5 (ref 5–15)
BUN: 27 mg/dL — ABNORMAL HIGH (ref 8–23)
CO2: 26 mmol/L (ref 22–32)
Calcium: 8.5 mg/dL — ABNORMAL LOW (ref 8.9–10.3)
Chloride: 106 mmol/L (ref 98–111)
Creatinine, Ser: 1.51 mg/dL — ABNORMAL HIGH (ref 0.61–1.24)
GFR, Estimated: 46 mL/min — ABNORMAL LOW (ref >60)
Glucose, Bld: 98 mg/dL (ref 70–99)
Potassium: 3.8 mmol/L (ref 3.5–5.1)
Sodium: 137 mmol/L (ref 135–145)
Total Bilirubin: 1.5 mg/dL — ABNORMAL HIGH (ref 0.3–1.2)
Total Protein: 5 g/dL — ABNORMAL LOW (ref 6.5–8.1)

## 2022-04-23 LAB — SURGICAL PATHOLOGY

## 2022-04-23 LAB — MAGNESIUM: Magnesium: 1.9 mg/dL (ref 1.7–2.4)

## 2022-04-23 LAB — BRAIN NATRIURETIC PEPTIDE: B Natriuretic Peptide: 329.1 pg/mL — ABNORMAL HIGH (ref 0.0–100.0)

## 2022-04-23 LAB — PROCALCITONIN: Procalcitonin: 0.5 ng/mL

## 2022-04-23 MED ORDER — SODIUM CHLORIDE 0.9 % IV SOLN
1.0000 g | INTRAVENOUS | Status: DC
  Administered 2022-04-23 – 2022-04-26 (×4): 1 g via INTRAVENOUS
  Filled 2022-04-23: qty 1
  Filled 2022-04-23: qty 10
  Filled 2022-04-23 (×3): qty 1
  Filled 2022-04-23: qty 10

## 2022-04-23 MED ORDER — ONDANSETRON HCL 4 MG/2ML IJ SOLN
4.0000 mg | Freq: Four times a day (QID) | INTRAMUSCULAR | Status: DC | PRN
  Administered 2022-04-26 – 2022-05-04 (×6): 4 mg via INTRAVENOUS
  Filled 2022-04-23 (×7): qty 2

## 2022-04-23 MED ORDER — SODIUM CHLORIDE 0.9 % IV SOLN
12.5000 mg | Freq: Four times a day (QID) | INTRAVENOUS | Status: DC | PRN
  Administered 2022-04-27: 12.5 mg via INTRAVENOUS
  Filled 2022-04-23: qty 12.5

## 2022-04-23 NOTE — Progress Notes (Signed)
Progress Note   Patient: Matthew Zamora:[id number] DOB: 28-Aug-1940 DOA: 04/17/2022     5 DOS: the patient was seen and examined on 04/23/2022   Brief hospital course: Matthew Zamora is a 81 y.o. male with medical history significant of hypertension, hyperlipidemia, COPD, APF on Eliquis, CHF with EF of 35-40%, CAD, anemia, CKD-3B, frequent fall, who presented on 04/17/2022 for evaluation of dizziness and lightheadedness, generalized weakness, near syncopal episodes.  This occurred in the setting of about one week of GI illness with nausea and vomiting.  In the ED, afebrile and stable vitals.   Labs notable for K 5.5, no leukocytosis, normal lactic acid x 2, AST 129, ALT 136, Tbili 1.4.  BNP mildly elevated 354.5, troponin x 2 normal, negative for Covid-19 and flu A/B.  Admitted to medicine service for further evaluation and management.    Pt later reported severe constipation at home, with associated lower abdominal pain. Improved after enema and having BM's.  11/2: pt with positive orthostatic vitals with severe symptoms of dizziness, unable to tolerate being upright.  Remains with poor PO intake, started on IV fluids.   11/3--5: ongoing N/V, dizziness. GI and cardiology consulted. IV fluids stopped 11/4 due to dyspnea and cxr with mild vascular congestion.  Eliquis held for EGD.  11/6: EGD showed erosive esophagitis, submucosal duodenal nodule - biopsies pending.      Assessment and Plan: * Near syncope Most likely due to dehydration in the setting of 1 week nausea vomiting.  Orthostatic vitals positive.  Patient appears dry on exam as well. -- Treated with IV fluids, stopped on morning of 11/4 due to dyspnea and signs of mild volume overload on labs and x-ray --Daily orthostatic vitals --Monitor on telemetry 11/7: last orthostatic vitals still positive but patient reports improved dizziness past two days --Supportive care for GI illness as outlined   Dyspnea Overnight  11/3-4, pt reported feeling short of breath. Has been on IV fluids for dehydration, orthostatic hypotension.  CXR obtained, showed borderline vascular congestions. --Stopped IV fluids 11/4 --Given 20 mg IV Lasix x 1 on 11/5 due to ongoing dyspnea and coarse crackles --Monitor closely 11/7: dyspnea has improved, but now found to have PNA --Supplement O2 if sats < 90% or PRN for comfort  Nausea & vomiting Patient reported 1 week history of nausea vomiting at home, leading to dehydration and near syncopal episodes with dizziness lightheadedness. Family report N/V issues frequently for several months, chronic issue. -- IV fluids - stopped 11/4 AM due to dyspnea, likely mild volume overload --IV antiemetics as needed --Monitor renal function and electrolytes --GI consulted, EGD done 11/6 (see erosive esophagitis) --On full liquid diet   NSVT (nonsustained ventricular tachycardia) ([place]) Central telemetry reported brief run of V-fib followed by nonsustained VT today (11/3). Patient has AICD in place. --Consult cardiology. --On amiodarone, mexiletine. --There is drug interaction with Amio increasing effects of mexiletine, side effects of mexiletine are similar patient's presenting symptoms. --Amiodarone dose reduced --Appreciate cardiology recommendations. --Monitor on telemetry. --Maintain K above 4, mag above 2  Orthostatic hypotension With PT today, patient was extremely dizzy and lightheaded.  Supine BP 109/80, seated BP 75/50 (repeat 80/63), could not tolerating standing position. --Treated IV fluids, stopped 11/4 due to dyspnea and mild vascular congestion on CXR --Daily orthostatic vitals --Fall precautions --Started midodrine since persistent despite adequate IV hydration  Abnormal LFTs On admission AST 129, ALT 136, total bili 1.4. RUQ ultrasound showed normal gallbladder and CBD, stable complex cyst in  the right lobe of liver.  Suspect due to GI illness, also likely congestive  hepatopathy.  Acute hepatitis panel negative. -- Monitor LFTs daily --GI consulted  11/7: AST/ALT improving  Hyperkalemia K was 5.5 on admission, now normalized. Maintain K>4 given hx of dysrhythmias.  CKD (chronic kidney disease), stage IIIb Baseline creatinine 1.7-1.8.  Renal function on admission was at baseline. Creatinine trended up in setting of dehydration with intractable N/V, low BP's.   Cr stable around 1.4-1.5 past few days. --Treated with IV fluids, stopped 11/4 AM --Daily BMP to monitor --Avoid nephrotoxins, hypotension --Renally dose meds  Chronic systolic CHF (congestive heart failure) ([place]) On admission, hypovolemic and dehydrated. Diuretics on hold.  Was on IV fluids. Still appears overall euvolemic, but increasing dyspnea. CXR with borderline vascular congestion and BNP 764 up from 300's.  May be developing mild decompensation, given IV lasix x 1 on 11/5. --Hold diuretics & use PRN  (appears he takes PRN torsemide at home) --Started on midodrine  --Metoprolol on hold --Daily weights --Monitor closely --Cardiology following  Paroxysmal atrial fibrillation ([place]) Rate controlled. --Continue amiodarone, Mexitil --Oral metoprolol now on hold due to hypotension --Eliquis held for EGD, resumed this morning --On telemetry --Lopressor IV PRN for HR sustained above 110  Essential hypertension Hold metoprolol given soft BPs and severe orthostatic symptoms Diuretic on hold IV hydralazine as needed  Now on midodrine  Hyperlipidemia Lipitor on hold due to elevated LFTs  CAD (coronary artery disease) Stable, no active chest pain. Continue ASA, metoprolol. Lipitor on hold due to elevated LFTs.  COPD (chronic obstructive pulmonary disease) ([name]) Stable without wheezing. Continue bronchodilators  Normocytic anemia Hemoglobin stable 10.8, 10.7, 9.8. Monitor CBC  Multifocal pneumonia CXR obtained today due to ongoing productive cough.  It shows bilateral  opacities concerning for PNA.  Concern he maybe aspirated during intractable N/V and dry heaving.   --Start Rocephin --Pulmonary hygiene --Mucinex and other supportive care PRN --Supplement O2 if needed to keep spO2>90%  Erosive esophagitis EGD on 11/6 notable for LA grade D esophagitis with no bleeding seen. --Follow biopsy path results --Continue PPI BID indefinitely --Started on Carafate --Follow up in GI clinic --Advance diet as tolerated (pt on full liquids, wants to wait until tomorrow to attempt advancing)  Intractable nausea and vomiting Chronic x several months. See Nausea & Vomiting, Erosive esophagitis EGD on 11/5 as above Follow up with GI as outpatient.  Dizziness See Near Syncope  Transaminitis See abnormal LFTs  Hyponatremia Likely hypovolemic in setting of N/V and dehydration. Resolved.  Postural dizziness with near syncope Most likely due to orthostatic hypotension. Given nausea vomiting, vertigo is in the differential but feel is less likely.  Pt denies room spinning.        Subjective: Patient was awake sitting up in bed when seen today.  He wants to wait until tomorrow to attempt advancing diet.  Reports N/V better, tolerating full liquids.  Reports dizziness is better also.  Admits to be very weak compared to baseline.     Physical Exam: Vitals:   04/23/22 0420 04/23/22 0448 04/23/22 0919 04/23/22 1230  BP:  112/67 127/70 (!) 102/54  Pulse:  95 96 77  Resp:  (!) '21 18 14  '$ Temp:  98.4 F (36.9 C) 99.4 F (37.4 C) 99 F (37.2 C)  TempSrc:      SpO2:  99% 98% 98%  Weight: 62.8 kg     Height:       General exam: Awake and alert, sitting  up in bed, no acute distress HEENT: Temporal wasting, hearing grossly normal Respiratory system: Normal respiratory effort, lungs overall clear with intermittent upper airway secretion sounds, no wheezes Cardiovascular system: RRR, no peripheral edema.   Gastrointestinal system: soft, NT, ND Central  nervous system: Alert and oriented, grossly nonfocal exam, normal speech Extremities: moves all, no edema, normal tone Skin: No peripheral edema, patchy ecchymosis on bilateral extremities, normal temperature Psychiatry: Normal mood, congruent affect, judgment and insight appear normal   Labs and data Reviewed: Notable labs --- Cr 1.51, BUN 27, AST improved 277, ALT improved 499, Tbili improved 1.5.  BNP improved 329.1, procal 0.50.  Chest xray today with bilateral opacities worse since 11/4, concern for PNA.   Echocardiogram on 11/5: LVEF 30 to 29%, grade 3 diastolic dysfunction, LV global hypokinesis, low normal RV systolic function    Family Communication: updated daughter-in-law, Audelia Acton, by phone this afternoon. Please contact [name] with updates.  Audelia Acton reports pt will be reluctant to go to SNF, but does need it.   Last time at SNF, he checked himself out while they were out of town.     Disposition: Status is: Inpatient Remains inpatient appropriate because: severe orthostatic dizziness, on IV fluids, not tolerating adequate PO intake, ongoing evaluation, cardiac dysrhythmias with medication changes.    Discharge pending further clinical improvement, adequate PO intake, clearance by consultants and SNF placement.      Planned Discharge Destination: Home    Time spent: 40 minutes  Author: Ezekiel Slocumb, DO 04/23/2022 1:54 PM  For on call review [email or web address].

## 2022-04-23 NOTE — Progress Notes (Signed)
Occupational Therapy Treatment Patient Details Name: Matthew Zamora MRN: [id number] DOB: 05-Nov-1940 Today's Date: 04/23/2022   History of present illness Pt is an 81 year old male presenting to ED with lightheadedness; PMH significant for  hypertension, hyperlipidemia, COPD, APF on Eliquis, CHF with EF of 35-40%, CAD, anemia, CKD-3B, and frequent falls.   OT comments  Patient in recliner upon arrival, calling out for help to get back into bed. BP monitored throughout session. Sitting:  BP: 109/53 (70). Patient was able to transfer sit <>stand with min A and VC for safety and sequencing using RW, poor standing tolerance. Patient placed in supine with supervision. BP at 107/55 (72). Nurse notified. Patient left in bed with call bell in reach, tray set up, bed alarm set, and all needs met.    Recommendations for follow up therapy are one component of a multi-disciplinary discharge planning process, led by the attending physician.  Recommendations may be updated based on patient status, additional functional criteria and insurance authorization.    Follow Up Recommendations  Skilled nursing-short term rehab (<3 hours/day)    Assistance Recommended at Discharge Frequent or constant Supervision/Assistance  Patient can return home with the following  A lot of help with walking and/or transfers;A lot of help with bathing/dressing/bathroom   Equipment Recommendations  Other (comment) (Defer to next venue of care.)       Precautions / Restrictions Precautions Precautions: Fall Precaution Comments: orthostatic Restrictions Weight Bearing Restrictions: No       Mobility Bed Mobility Overal bed mobility: Needs Assistance Bed Mobility: Sit to Supine       Sit to supine: Supervision        Transfers Overall transfer level: Needs assistance Equipment used: Rolling walker (2 wheels) Transfers: Sit to/from Stand Sit to Stand: Min assist           General transfer comment: Min  verbal cues for sequencing     Balance Overall balance assessment: History of Falls, Needs assistance Sitting-balance support: Feet supported, Bilateral upper extremity supported Sitting balance-Leahy Scale: Fair     Standing balance support: Reliant on assistive device for balance, Bilateral upper extremity supported, During functional activity Standing balance-Leahy Scale: Poor                             ADL either performed or assessed with clinical judgement   ADL   Eating/Feeding: Set up;Sitting;Bed level                                          Extremity/Trunk Assessment Upper Extremity Assessment Upper Extremity Assessment: Generalized weakness   Lower Extremity Assessment Lower Extremity Assessment: Generalized weakness        Vision Patient Visual Report: No change from baseline            Cognition Arousal/Alertness: Lethargic Behavior During Therapy: WFL for tasks assessed/performed Overall Cognitive Status: Within Functional Limits for tasks assessed                                                     Pertinent Vitals/ Pain       Pain Assessment Pain Assessment: No/denies pain   Frequency  Min 2X/week  Progress Toward Goals  OT Goals(current goals can now be found in the care plan section)     Acute Rehab OT Goals Patient Stated Goal: to feel better OT Goal Formulation: With patient Time For Goal Achievement: 05/02/22 Potential to Achieve Goals: Good   AM-PAC OT "6 Clicks" Daily Activity     Outcome Measure   Help from another person eating meals?: None Help from another person taking care of personal grooming?: A Little Help from another person toileting, which includes using toliet, bedpan, or urinal?: A Lot Help from another person bathing (including washing, rinsing, drying)?: A Lot Help from another person to put on and taking off regular upper body clothing?: A  Little Help from another person to put on and taking off regular lower body clothing?: A Lot 6 Click Score: 16    End of Session Equipment Utilized During Treatment: Gait belt;Rolling walker (2 wheels)  OT Visit Diagnosis: Unsteadiness on feet (R26.81);Muscle weakness (generalized) (M62.81);Repeated falls (R29.6)   Activity Tolerance Patient limited by fatigue   Patient Left in bed;with call bell/phone within reach;with bed alarm set   Nurse Communication Mobility status;Other (comment) (Orthostatics)        Time: 7342-8768 OT Time Calculation (min): 18 min  Charges: OT General Charges $OT Visit: 1 Visit OT Treatments $Therapeutic Activity: 8-22 mins    Aws Shere, OTS 04/23/2022, 3:24 PM

## 2022-04-23 NOTE — Progress Notes (Signed)
Physical Therapy Treatment Patient Details Name: Matthew Zamora MRN: [id number] DOB: 1941/06/01 Today's Date: 04/23/2022   History of Present Illness Pt is an 81 year old male presenting to ED with lightheadedness; PMH significant for  hypertension, hyperlipidemia, COPD, APF on Eliquis, CHF with EF of 35-40%, CAD, anemia, CKD-3B, and frequent falls.    PT Comments    Pt received in bed agreeable to PT interventions but refused to get OOB to chair as he sat in chair for >2 hours and reported of being exhausted. Pt performed AROM to BLE in supine and seated 1 x 10 reps each. Pt Advised to the same every 4 hours to improve strength and endurance. Pt Performed bed mobility with Min guard to Min assist. Pt Performed STS x 1 and stood for 2 mins with CGA of 1 using FWW. Pt BP sitting 97/55 and standing 107/57 asymptomatic. Pt fatigues easily due to decreased aerobic capacity. SPO2 between 94 to 97 through out the session.Pt will benefit from SNF  after acute care to return to PLOF.     Recommendations for follow up therapy are one component of a multi-disciplinary discharge planning process, led by the attending physician.  Recommendations may be updated based on patient status, additional functional criteria and insurance authorization.  Follow Up Recommendations  Skilled nursing-short term rehab (<3 hours/day) Can patient physically be transported by private vehicle: No   Assistance Recommended at Discharge Frequent or constant Supervision/Assistance  Patient can return home with the following A little help with walking and/or transfers;A little help with bathing/dressing/bathroom;Assist for transportation;Help with stairs or ramp for entrance;Assistance with cooking/housework   Equipment Recommendations  Other (comment)    Recommendations for Other Services       Precautions / Restrictions Precautions Precautions: Fall Precaution Comments: orthostatic Restrictions Weight Bearing  Restrictions: No     Mobility  Bed Mobility Overal bed mobility: Needs Assistance Bed Mobility: Sit to Supine, Supine to Sit     Supine to sit: Supervision Sit to supine: Supervision   General bed mobility comments: weak and coughing    Transfers Overall transfer level: Needs assistance Equipment used: Rolling walker (2 wheels) Transfers: Sit to/from Stand Sit to Stand: Min guard, Min assist           General transfer comment: min VC for postural corrections.    Ambulation/Gait                   Stairs: N/A             Wheelchair Mobility    Modified Rankin (Stroke Patients Only)       Balance Overall balance assessment: History of Falls, Needs assistance Sitting-balance support: Feet supported, Bilateral upper extremity supported Sitting balance-Leahy Scale: Fair     Standing balance support: Reliant on assistive device for balance, Bilateral upper extremity supported, During functional activity Standing balance-Leahy Scale: Fair                              Cognition Arousal/Alertness: Awake/alert Behavior During Therapy: WFL for tasks assessed/performed Overall Cognitive Status: Within Functional Limits for tasks assessed Area of Impairment: Awareness, Problem solving                                        Exercises Total Joint Exercises Ankle Circles/Pumps: AROM, 15 reps, Seated Long  Arc Quad: AROM, 10 reps, Seated Other Exercises Other Exercises: Pt advised to the same every 4 hours.    General Comments        Pertinent Vitals/Pain Pain Assessment Pain Assessment: No/denies pain    Home Living                          Prior Function            PT Goals (current goals can now be found in the care plan section) Acute Rehab PT Goals PT Goal Formulation: With patient Time For Goal Achievement: 05/02/22 Potential to Achieve Goals: Fair Progress towards PT goals: Progressing toward  goals    Frequency    Min 2X/week      PT Plan Current plan remains appropriate    Co-evaluation              AM-PAC PT "6 Clicks" Mobility   Outcome Measure  Help needed turning from your back to your side while in a flat bed without using bedrails?: A Little Help needed moving from lying on your back to sitting on the side of a flat bed without using bedrails?: A Little Help needed moving to and from a bed to a chair (including a wheelchair)?: A Little Help needed standing up from a chair using your arms (e.g., wheelchair or bedside chair)?: A Little Help needed to walk in hospital room?: Total Help needed climbing 3-5 steps with a railing? : Total 6 Click Score: 14    End of Session Equipment Utilized During Treatment: Gait belt;Oxygen Activity Tolerance: Patient limited by fatigue;Treatment limited secondary to medical complications (Comment) Patient left: in bed;with call bell/phone within reach;with bed alarm set Nurse Communication: Mobility status (BP measurements) PT Visit Diagnosis: Unsteadiness on feet (R26.81);Muscle weakness (generalized) (M62.81);History of falling (Z91.81)     Time: 8675-4492 PT Time Calculation (min) (ACUTE ONLY): 21 min  Charges:  $Therapeutic Activity: 8-22 mins          Tasneem Cormier PT DPT 4:59 PM,04/23/22

## 2022-04-23 NOTE — Assessment & Plan Note (Addendum)
CXR showed bilateral opacities concerning for PNA.  Concern he maybe aspirated during intractable N/V and dry heaving.   --Completed course of Rocephin --Pulmonary hygiene --Mucinex and other supportive care PRN --Supplement O2 if needed to keep spO2>90%

## 2022-04-24 DIAGNOSIS — R55 Syncope and collapse: Secondary | ICD-10-CM | POA: Diagnosis not present

## 2022-04-24 DIAGNOSIS — I5022 Chronic systolic (congestive) heart failure: Secondary | ICD-10-CM | POA: Diagnosis not present

## 2022-04-24 DIAGNOSIS — J189 Pneumonia, unspecified organism: Secondary | ICD-10-CM | POA: Diagnosis not present

## 2022-04-24 DIAGNOSIS — I951 Orthostatic hypotension: Secondary | ICD-10-CM | POA: Diagnosis not present

## 2022-04-24 LAB — MAGNESIUM: Magnesium: 1.9 mg/dL (ref 1.7–2.4)

## 2022-04-24 MED ORDER — MIDODRINE HCL 5 MG PO TABS
10.0000 mg | ORAL_TABLET | Freq: Three times a day (TID) | ORAL | Status: DC
  Administered 2022-04-24 – 2022-05-08 (×43): 10 mg via ORAL
  Filled 2022-04-24 (×43): qty 2

## 2022-04-24 NOTE — Progress Notes (Signed)
Progress Note    Matthew CASTREJON  EYC:[id number] DOB: 02/21/1941  DOA: 04/17/2022 PCP: Maryland Pink, MD      Brief Narrative:    Medical records reviewed and are as summarized below:   Matthew Zamora is a 81 y.o. male with medical history significant of hypertension, hyperlipidemia, COPD, APF on Eliquis, CHF with EF of 35-40%, CAD, anemia, CKD-3B, frequent fall, who presented on 04/17/2022 for evaluation of dizziness and lightheadedness, generalized weakness, near syncopal episodes.  This occurred in the setting of about one week of GI illness with nausea and vomiting.  In the ED, afebrile and stable vitals.   Labs notable for K 5.5, no leukocytosis, normal lactic acid x 2, AST 129, ALT 136, Tbili 1.4.  BNP mildly elevated 354.5, troponin x 2 normal, negative for Covid-19 and flu A/B.  Admitted to medicine service for further evaluation and management.    Pt later reported severe constipation at home, with associated lower abdominal pain. Improved after enema and having BM's.  11/2: pt with positive orthostatic vitals with severe symptoms of dizziness, unable to tolerate being upright.  Remains with poor PO intake, started on IV fluids.   11/3--5: ongoing N/V, dizziness. GI and cardiology consulted. IV fluids stopped 11/4 due to dyspnea and cxr with mild vascular congestion.  Eliquis held for EGD.  11/6: EGD showed erosive esophagitis, submucosal duodenal nodule - biopsies report was negative for celiac disease, dysplasia or malignancy.        Assessment/Plan:   Principal Problem:   Near syncope Active Problems:   Dyspnea   NSVT (nonsustained ventricular tachycardia) ([name])   Nausea & vomiting   Hyperkalemia   Abnormal LFTs   Orthostatic hypotension   CKD (chronic kidney disease), stage IIIb   Paroxysmal atrial fibrillation ([name])   Chronic systolic CHF (congestive heart failure) ([name])   Essential hypertension   Hyperlipidemia   CAD (coronary artery  disease)   COPD (chronic obstructive pulmonary disease) ([name])   Normocytic anemia   Postural dizziness with near syncope   Hyponatremia   Transaminitis   Dizziness   Intractable nausea and vomiting   Erosive esophagitis   Multifocal pneumonia    Body mass index is 19.74 kg/m.   Postural dizziness with near syncope, orthostatic hypotension: Supine BP 104/51, sitting BP 71/55, standing 65/46.  Increase midodrine 5 to 10 mg 3 times daily.  Multifocal pneumonia: continue IV ceftriaxone  Erosive esophagitis on EGD on 04/22/2022: Continue Protonix twice daily indefinitely.  Continue Carafate.  Intractable nausea and vomiting: EGD showed erosive esophagitis.  Outpatient follow-up with GI.  Acute on chronic systolic CHF: He was given IV Lasix 04/21/2022 for suspected acute exacerbation of CHF.  Paroxysmal atrial fibrillation, NSVT: Central telemetry reported brief run of V-fib followed by nonsustained V. tach 04/19/2022.  Patient has AICD in place Amiodarone decreased from 400 to 200 mg daily.  Metoprolol on hold because of hypotension: Continue Eliquis and mexiletine.  Elevated liver enzymes: Amiodarone dose has been decreased. Liver ultrasound showed normal gallbladder and CBD.  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  This cyst is stable from previous CT in 2023 in 2021.  AKI on CKD stage IIIb: Creatinine has improved.   Hyponatremia, hyperkalemia: Resolved   Other comorbidities include CAD, COPD, hypertension,         Diet Order             Diet full liquid  Room service appropriate? Yes; Fluid consistency: Thin  Diet effective now                            Consultants: Cardiologist  Procedures: EGD on 04/22/2022    Medications:    amiodarone  200 mg Oral Daily   apixaban  2.5 mg Oral BID   ascorbic acid  500 mg Oral Daily   aspirin  81 mg Oral Q1200   feeding supplement  1 Container Oral TID BM    gabapentin  100 mg Oral QHS   magnesium oxide  400 mg Oral Daily   melatonin  10 mg Oral QHS   mexiletine  150 mg Oral BID   midodrine  5 mg Oral TID WC   mometasone-formoterol  2 puff Inhalation BID   multivitamin with minerals  1 tablet Oral Daily   pantoprazole  40 mg Oral BID   polyethylene glycol  17 g Oral Daily   sertraline  25 mg Oral Daily   sucralfate  1 g Oral TID WC & HS   tamsulosin  0.4 mg Oral Daily   Continuous Infusions:  cefTRIAXone (ROCEPHIN)  IV Stopped (04/23/22 1733)   promethazine (PHENERGAN) injection (IM or IVPB)       Anti-infectives (From admission, onward)    Start     Dose/Rate Route Frequency Ordered Stop   04/23/22 1430  cefTRIAXone (ROCEPHIN) 1 g in sodium chloride 0.9 % 100 mL IVPB        1 g 200 mL/hr over 30 Minutes Intravenous Every 24 hours 04/23/22 1332                Family Communication/Anticipated D/C date and plan/Code Status   DVT prophylaxis: apixaban (ELIQUIS) tablet 2.5 mg Start: 04/23/22 1000 apixaban (ELIQUIS) tablet 2.5 mg     Code Status: Full Code  Family Communication: None Disposition Plan: Plan to discharge to SNF   Status is: Inpatient Remains inpatient appropriate because: On IV antibiotics       Subjective:   He complains of cough productive of yellow phlegm, dizzy on standing  Objective:    Vitals:   04/24/22 0436 04/24/22 0802 04/24/22 0807 04/24/22 0935  BP: (!) 110/52 120/64 (!) 100/49 (!) 122/55  Pulse: 87 91 94 (!) 101  Resp: 18 20    Temp: 98.3 F (36.8 C) (!) 97.4 F (36.3 C)    TempSrc: Oral     SpO2: 98% 95% (!) 88% 91%  Weight: 58.9 kg     Height:       Orthostatic VS for the past 24 hrs:  BP- Lying Pulse- Lying BP- Sitting Pulse- Sitting BP- Standing at 0 minutes Pulse- Standing at 0 minutes  04/24/22 0750 104/51 89 (!) 71/54 93 (!) 65/46 98     Intake/Output Summary (Last 24 hours) at 04/24/2022 1046 Last data filed at 04/24/2022 1019 Gross per 24 hour  Intake 220 ml   Output 400 ml  Net -180 ml   Filed Weights   04/20/22 0500 04/23/22 0420 04/24/22 0436  Weight: 66.4 kg 62.8 kg 58.9 kg    Exam:  GEN: NAD SKIN: Warm and dry EYES: No pallor or icterus ENT: MMM CV: RRR PULM:  Bibasilar rales, mild occasional wheezing bilaterally ABD: soft, ND, NT, +BS CNS: AAO x 3, non focal EXT: No edema or tenderness        Data Reviewed:   I have personally reviewed following labs  and imaging studies:  Labs: Labs show the following:   Basic Metabolic Panel: Recent Labs  Lab 04/19/22 0603 04/20/22 0544 04/21/22 0548 04/22/22 0458 04/23/22 0458 04/24/22 0457  NA 137 136 137 136 137  --   K 3.5 4.3 4.2 3.9 3.8  --   CL 106 108 107 103 106  --   CO2 '24 23 24 26 26  '$ --   GLUCOSE 115* 95 97 69* 98  --   BUN '22 19 18 23 '$ 27*  --   CREATININE 1.71* 1.50* 1.48* 1.49* 1.51*  --   CALCIUM 8.3* 8.5* 8.6* 8.5* 8.5*  --   MG  --  1.7 1.9 1.8 1.9 1.9  PHOS  --   --  3.1  --   --   --    GFR Estimated Creatinine Clearance: 32.5 mL/min (A) (by C-G formula based on SCr of 1.51 mg/dL (H)). Liver Function Tests: Recent Labs  Lab 04/19/22 0603 04/20/22 0544 04/21/22 0548 04/22/22 0458 04/23/22 0458  AST 228* 331* 411* 459* 277*  ALT 263* 403* 527* 604* 499*  ALKPHOS 66 75 78 72 72  BILITOT 0.6 0.6 1.3* 1.8* 1.5*  PROT 4.8* 5.2* 5.3* 5.2* 5.0*  ALBUMIN 2.7* 3.0* 2.8* 2.8* 2.6*   No results for input(s): "LIPASE", "AMYLASE" in the last 168 hours. No results for input(s): "AMMONIA" in the last 168 hours. Coagulation profile No results for input(s): "INR", "PROTIME" in the last 168 hours.  CBC: Recent Labs  Lab 04/20/22 0544 04/23/22 1039  WBC 7.9 10.3  HGB 9.8* 9.4*  HCT 29.9* 29.0*  MCV 89.8 89.8  PLT 145* 144*   Cardiac Enzymes: No results for input(s): "CKTOTAL", "CKMB", "CKMBINDEX", "TROPONINI" in the last 168 hours. BNP (last 3 results) No results for input(s): "PROBNP" in the last 8760 hours. CBG: Recent Labs  Lab  04/20/22 0631 04/22/22 1615  GLUCAP 88 92   D-Dimer: No results for input(s): "DDIMER" in the last 72 hours. Hgb A1c: No results for input(s): "HGBA1C" in the last 72 hours. Lipid Profile: No results for input(s): "CHOL", "HDL", "LDLCALC", "TRIG", "CHOLHDL", "LDLDIRECT" in the last 72 hours. Thyroid function studies: No results for input(s): "TSH", "T4TOTAL", "T3FREE", "THYROIDAB" in the last 72 hours.  Invalid input(s): "FREET3" Anemia work up: No results for input(s): "VITAMINB12", "FOLATE", "FERRITIN", "TIBC", "IRON", "RETICCTPCT" in the last 72 hours. Sepsis Labs: Recent Labs  Lab 04/20/22 0544 04/23/22 1039  PROCALCITON  --  0.50  WBC 7.9 10.3    Microbiology Recent Results (from the past 240 hour(s))  Resp Panel by RT-PCR (Flu A&B, Covid) Anterior Nasal Swab     Status: None   Collection Time: 04/17/22 11:05 AM   Specimen: Anterior Nasal Swab  Result Value Ref Range Status   SARS Coronavirus 2 by RT PCR NEGATIVE NEGATIVE Final    Comment: (NOTE) SARS-CoV-2 target nucleic acids are NOT DETECTED.  The SARS-CoV-2 RNA is generally detectable in upper respiratory specimens during the acute phase of infection. The lowest concentration of SARS-CoV-2 viral copies this assay can detect is 138 copies/mL. A negative result does not preclude SARS-Cov-2 infection and should not be used as the sole basis for treatment or other patient management decisions. A negative result may occur with  improper specimen collection/handling, submission of specimen other than nasopharyngeal swab, presence of viral mutation(s) within the areas targeted by this assay, and inadequate number of viral copies(<138 copies/mL). A negative result must be combined with clinical observations, patient history, and epidemiological  information. The expected result is Negative.  Fact Sheet for Patients:  EntrepreneurPulse.com.au  Fact Sheet for Healthcare Providers:   IncredibleEmployment.be  This test is no t yet approved or cleared by the [place] FDA and  has been authorized for detection and/or diagnosis of SARS-CoV-2 by FDA under an Emergency Use Authorization (EUA). This EUA will remain  in effect (meaning this test can be used) for the duration of the COVID-19 declaration under Section 564(b)(1) of the Act, 21 U.S.C.section 360bbb-3(b)(1), unless the authorization is terminated  or revoked sooner.       Influenza A by PCR NEGATIVE NEGATIVE Final   Influenza B by PCR NEGATIVE NEGATIVE Final    Comment: (NOTE) The Xpert Xpress SARS-CoV-2/FLU/RSV plus assay is intended as an aid in the diagnosis of influenza from Nasopharyngeal swab specimens and should not be used as a sole basis for treatment. Nasal washings and aspirates are unacceptable for Xpert Xpress SARS-CoV-2/FLU/RSV testing.  Fact Sheet for Patients: EntrepreneurPulse.com.au  Fact Sheet for Healthcare Providers: IncredibleEmployment.be  This test is not yet approved or cleared by the [place] FDA and has been authorized for detection and/or diagnosis of SARS-CoV-2 by FDA under an Emergency Use Authorization (EUA). This EUA will remain in effect (meaning this test can be used) for the duration of the COVID-19 declaration under Section 564(b)(1) of the Act, 21 U.S.C. section 360bbb-3(b)(1), unless the authorization is terminated or revoked.  Performed at [facility], [address] [place], [place] [address]     Procedures and diagnostic studies:  DG Chest [facility] 1 View  Result Date: 04/23/2022 CLINICAL DATA:  Productive cough EXAM: PORTABLE CHEST 1 VIEW COMPARISON:  04/20/2022, multiple old films dating back to 02/14/2020 FINDINGS: AICD remains in place. The right ventricular lead is kinked within the right ventricle. Heart is normal size. Mediastinal contours within normal limits. Patchy bilateral  airspace disease most notable in the right mid lung and left lower lobe concerning for pneumonia. No effusions or acute bony abnormality. IMPRESSION: Patchy bilateral airspace disease concerning for pneumonia. AICD right ventricular lead kinked within the right ventricle. This is unchanged dating back to 01/03/2022. Electronically Signed   By: Rolm Baptise M.D.   On: 04/23/2022 11:33               LOS: 6 days   Delphia Kaylor  [facility]   Pager on [email or web address]. If 7PM-7AM, please contact night-coverage at [email or web address]     04/24/2022, 10:46 AM

## 2022-04-24 NOTE — Plan of Care (Signed)

## 2022-04-25 DIAGNOSIS — K2210 Ulcer of esophagus without bleeding: Secondary | ICD-10-CM | POA: Diagnosis not present

## 2022-04-25 DIAGNOSIS — J189 Pneumonia, unspecified organism: Secondary | ICD-10-CM | POA: Diagnosis not present

## 2022-04-25 DIAGNOSIS — I951 Orthostatic hypotension: Secondary | ICD-10-CM | POA: Diagnosis not present

## 2022-04-25 DIAGNOSIS — R55 Syncope and collapse: Secondary | ICD-10-CM | POA: Diagnosis not present

## 2022-04-25 LAB — COMPREHENSIVE METABOLIC PANEL
ALT: 322 U/L — ABNORMAL HIGH (ref 0–44)
AST: 208 U/L — ABNORMAL HIGH (ref 15–41)
Albumin: 2.3 g/dL — ABNORMAL LOW (ref 3.5–5.0)
Alkaline Phosphatase: 77 U/L (ref 38–126)
Anion gap: 4 — ABNORMAL LOW (ref 5–15)
BUN: 31 mg/dL — ABNORMAL HIGH (ref 8–23)
CO2: 28 mmol/L (ref 22–32)
Calcium: 8.4 mg/dL — ABNORMAL LOW (ref 8.9–10.3)
Chloride: 106 mmol/L (ref 98–111)
Creatinine, Ser: 1.38 mg/dL — ABNORMAL HIGH (ref 0.61–1.24)
GFR, Estimated: 52 mL/min — ABNORMAL LOW (ref >60)
Glucose, Bld: 82 mg/dL (ref 70–99)
Potassium: 3.5 mmol/L (ref 3.5–5.1)
Sodium: 138 mmol/L (ref 135–145)
Total Bilirubin: 1.7 mg/dL — ABNORMAL HIGH (ref 0.3–1.2)
Total Protein: 4.7 g/dL — ABNORMAL LOW (ref 6.5–8.1)

## 2022-04-25 MED ORDER — POTASSIUM CHLORIDE CRYS ER 20 MEQ PO TBCR
40.0000 meq | EXTENDED_RELEASE_TABLET | Freq: Once | ORAL | Status: AC
  Administered 2022-04-25: 40 meq via ORAL
  Filled 2022-04-25: qty 2

## 2022-04-25 NOTE — NC FL2 (Signed)
[place] LEVEL OF CARE SCREENING TOOL     IDENTIFICATION  Patient Name: Matthew Zamora Birthdate: January 24, 1941 Sex: male Admission Date (Current Location): 04/17/2022  [facility] and [place] Number:  Engineering geologist and Address:  [facility], [address], [place], [place] [address]      Provider Number: [id number]  Attending Physician Name and Address:  Jennye Boroughs, MD  Relative Name and Phone Number:  Mckoy Bhakta    Current Level of Care: Hospital Recommended Level of Care: [place] Prior Approval Number:    Date Approved/Denied:   PASRR Number: [id number]  Discharge Plan: SNF    Current Diagnoses: Patient Active Problem List   Diagnosis Date Noted   Multifocal pneumonia 04/23/2022   Erosive esophagitis 04/22/2022   Dizziness 04/21/2022   Intractable nausea and vomiting 04/21/2022   Dyspnea 04/20/2022   Transaminitis 04/20/2022   Hyponatremia 04/19/2022   Nausea & vomiting 04/18/2022   Orthostatic hypotension 04/18/2022   Postural dizziness with near syncope 04/18/2022   Near syncope 04/17/2022   COPD (chronic obstructive pulmonary disease) ([place]) 04/17/2022   Hyperkalemia 04/17/2022   Abnormal LFTs 56/31/4970   Chronic systolic CHF (congestive heart failure) ([place]) 04/17/2022   Normocytic anemia 04/17/2022   GERD without esophagitis 02/12/2022   Recurrent falls    Generalized weakness    NSVT (nonsustained ventricular tachycardia) ([place]) 02/11/2022   Acute on chronic combined systolic (congestive) and diastolic (congestive) heart failure ([place]) 08/10/2021   Acute respiratory failure with hypoxia ([place])    HCAP (healthcare-associated pneumonia) 06/13/2021   Acute on chronic systolic CHF (congestive heart failure) ([place]) 06/13/2021   Elevated troponin 06/13/2021   CKD (chronic kidney disease), 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   Lung nodule 06/13/2021   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   COPD  exacerbation ([place]) 06/13/2021   Volume depletion 12/15/2019   Hypotension 12/15/2019   Cardiorenal syndrome    Atrial fibrillation with RVR ([name])    Defibrillator discharge    Tachycardia 10/25/2019   CHF (congestive heart failure) ([place]) 09/18/2019   Acute on chronic combined systolic and diastolic CHF (congestive heart failure) ([place]) 09/17/2019   CAD (coronary artery disease) 09/17/2019   Ischemic cardiomyopathy 09/17/2019   CKD (chronic kidney disease) stage 3, GFR 30-59 ml/min ([name]) 09/17/2019   Acute hypoxemic respiratory failure ([place]) 09/17/2018   AAA (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 ([place]) 07/06/2018   Essential hypertension 07/06/2018   Hyperlipidemia 07/06/2018   Paroxysmal atrial fibrillation ([name])    Acute on chronic respiratory failure with hypoxia ([place]) 04/26/2018    Orientation RESPIRATION BLADDER Height & Weight     Self, Time, Situation, Place  Normal External catheter Weight: 137 lb 12.6 oz (62.5 kg) Height:  '5\' 8"'$  (172.7 cm)  BEHAVIORAL SYMPTOMS/MOOD NEUROLOGICAL BOWEL NUTRITION STATUS   (none)  (none) Continent Diet (full liquid diet, fluid consistency: thin)  AMBULATORY STATUS COMMUNICATION OF NEEDS Skin   Extensive Assist Verbally Other (Comment) (ecchymosis, scrotum)                       Personal Care Assistance Level of Assistance  Bathing, Feeding, Dressing Bathing Assistance: Maximum assistance Feeding assistance: Independent Dressing Assistance: Maximum assistance     Functional Limitations Info  Speech, Hearing, Sight Sight Info: Adequate Hearing Info: Adequate Speech Info: Adequate    SPECIAL CARE FACTORS FREQUENCY  OT (By licensed OT), PT (By licensed PT)     PT Frequency: 5x a week OT Frequency: 5x a week  Contractures Contractures Info: Not present    Additional Factors Info  Code Status Code Status Info: Full code             Current Medications (04/25/2022):  This is the current hospital active  medication list Current Facility-Administered Medications  Medication Dose Route Frequency Provider Last Rate Last Admin   acetaminophen (TYLENOL) tablet 650 mg  650 mg Oral Q6H PRN Nicole Kindred A, DO   650 mg at 04/25/22 0917   albuterol (VENTOLIN HFA) 108 (90 Base) MCG/ACT inhaler 2 puff  2 puff Inhalation Q4H PRN Ivor Costa, MD   2 puff at 04/20/22 2330   amiodarone (PACERONE) tablet 200 mg  200 mg Oral Daily Callwood, Dwayne D, MD   200 mg at 04/25/22 0917   apixaban (ELIQUIS) tablet 2.5 mg  2.5 mg Oral BID Nicole Kindred A, DO   2.5 mg at 04/25/22 1443   ascorbic acid (VITAMIN C) tablet 500 mg  500 mg Oral Daily Ivor Costa, MD   500 mg at 04/25/22 1540   aspirin chewable tablet 81 mg  81 mg Oral Q1200 Ivor Costa, MD   81 mg at 04/24/22 1230   cefTRIAXone (ROCEPHIN) 1 g in sodium chloride 0.9 % 100 mL IVPB  1 g Intravenous Q24H Nicole Kindred A, DO   Stopped at 04/24/22 1538   dextromethorphan-guaiFENesin ([place] DM) 30-600 MG per 12 hr tablet 1 tablet  1 tablet Oral BID PRN Ivor Costa, MD   1 tablet at 04/25/22 0917   feeding supplement (BOOST / RESOURCE BREEZE) liquid 1 Container  1 Container Oral TID BM Lin Landsman, MD   1 Container at 04/25/22 0867   gabapentin (NEURONTIN) capsule 100 mg  100 mg Oral QHS Ivor Costa, MD   100 mg at 04/24/22 2055   hydrALAZINE (APRESOLINE) injection 5 mg  5 mg Intravenous Q2H PRN Ivor Costa, MD       magnesium oxide (MAG-OX) tablet 400 mg  400 mg Oral Daily Ivor Costa, MD   400 mg at 04/25/22 0917   melatonin tablet 10 mg  10 mg Oral QHS Coulter, Hoyle Sauer, RPH   10 mg at 04/24/22 2056   metoprolol tartrate (LOPRESSOR) injection 5 mg  5 mg Intravenous Q4H PRN Nicole Kindred A, DO       mexiletine (MEXITIL) capsule 150 mg  150 mg Oral BID Ivor Costa, MD   150 mg at 04/25/22 0917   midodrine (PROAMATINE) tablet 10 mg  10 mg Oral TID WC Jennye Boroughs, MD   10 mg at 04/25/22 0917   mometasone-formoterol (DULERA) 200-5 MCG/ACT inhaler 2 puff  2 puff  Inhalation BID Ivor Costa, MD   2 puff at 04/25/22 0916   multivitamin with minerals tablet 1 tablet  1 tablet Oral Daily Ivor Costa, MD   1 tablet at 04/25/22 0917   ondansetron (ZOFRAN) injection 4 mg  4 mg Intravenous Q6H PRN Nicole Kindred A, DO       pantoprazole (PROTONIX) EC tablet 40 mg  40 mg Oral BID Dorothe Pea, RPH   40 mg at 04/25/22 0917   polyethylene glycol (MIRALAX / GLYCOLAX) packet 17 g  17 g Oral Daily Foust, Katy L, NP   17 g at 04/25/22 0919   promethazine (PHENERGAN) 12.5 mg in sodium chloride 0.9 % 50 mL IVPB  12.5 mg Intravenous Q6H PRN Nicole Kindred A, DO       sertraline (ZOLOFT) tablet 25 mg  25 mg Oral Daily Niu,  Soledad Gerlach, MD   25 mg at 04/25/22 0917   sucralfate (CARAFATE) 1 GM/10ML suspension 1 g  1 g Oral TID WC & HS Lin Landsman, MD   1 g at 04/25/22 0916   tamsulosin (FLOMAX) capsule 0.4 mg  0.4 mg Oral Daily Ivor Costa, MD   0.4 mg at 04/25/22 9643     Discharge Medications: Please see discharge summary for a list of discharge medications.  Relevant Imaging Results:  Relevant Lab Results:   Additional Information SSN: [id number]  Colen Darling, LCSWA

## 2022-04-25 NOTE — Progress Notes (Signed)
Physical Therapy Treatment Patient Details Name: Matthew Zamora MRN: [id number] DOB: [birth year] Today's Date: 04/25/2022   History of Present Illness Pt is an 81 year old male presenting to ED with lightheadedness; PMH significant for  hypertension, hyperlipidemia, COPD, APF on Eliquis, CHF with EF of 35-40%, CAD, anemia, CKD-3B, and frequent falls.    PT Comments    Pt is making poor progress towards goals secondary to +orthostatics. Pt with post lean during mobility and able to perform weight shifts in lateral/ant/post direction with min assist. Pt symptomatic with standing, with poor tolerance and low BP. Not safe to ambulate this session. During final BP, pt buckled and had uncontrolled decent back to bed. +2 recommended for safe mobility. Will continue to progress.  Orthostatic VS for the past 24 hrs:  BP- Lying Pulse- Lying BP- Sitting Pulse- Sitting BP- Standing at 0 minutes Pulse- Standing at 0 minutes  04/25/22 1426 92/48 84 (!) 73/45 89 (!) 68/51 91  04/25/22 0950 113/46 95 (!) 84/50 100 -- --       Recommendations for follow up therapy are one component of a multi-disciplinary discharge planning process, led by the attending physician.  Recommendations may be updated based on patient status, additional functional criteria and insurance authorization.  Follow Up Recommendations  Skilled nursing-short term rehab (<3 hours/day) Can patient physically be transported by private vehicle: No   Assistance Recommended at Discharge Frequent or constant Supervision/Assistance  Patient can return home with the following A little help with walking and/or transfers;A little help with bathing/dressing/bathroom;Assist for transportation;Help with stairs or ramp for entrance;Assistance with cooking/housework   Equipment Recommendations  None recommended by PT    Recommendations for Other Services       Precautions / Restrictions Precautions Precautions: Fall Precaution Comments:  orthostatic Restrictions Weight Bearing Restrictions: No     Mobility  Bed Mobility Overal bed mobility: Needs Assistance Bed Mobility: Supine to Sit, Sit to Supine     Supine to sit: Min assist Sit to supine: Min assist   General bed mobility comments: reaches out for mobility specialist hand for assistance. Once seated at EOB, needs min assist for scooting out towards EOB. Post lean noted during sitting.    Transfers Overall transfer level: Needs assistance Equipment used: Rolling walker (2 wheels) Transfers: Sit to/from Stand Sit to Stand: Min assist, +2 safety/equipment           General transfer comment: needs cues for sequencing as tends to pull up on RW. Dizziness with initial standing with dizziness worsening with prolonged time.    Ambulation/Gait               General Gait Details: unable as pt orthostatic and symptomatic   Stairs             Wheelchair Mobility    Modified Rankin (Stroke Patients Only)       Balance Overall balance assessment: History of Falls, Needs assistance Sitting-balance support: Feet supported, Bilateral upper extremity supported Sitting balance-Leahy Scale: Fair     Standing balance support: Bilateral upper extremity supported Standing balance-Leahy Scale: Poor                              Cognition Arousal/Alertness: Awake/alert Behavior During Therapy: WFL for tasks assessed/performed Overall Cognitive Status: Within Functional Limits for tasks assessed  Exercises Other Exercises Other Exercises: educated on HEP from previous therapist- reviewed frequency and duration    General Comments        Pertinent Vitals/Pain Pain Assessment Pain Assessment: No/denies pain    Home Living                          Prior Function            PT Goals (current goals can now be found in the care plan section) Acute Rehab PT  Goals Patient Stated Goal: to return home PT Goal Formulation: With patient Time For Goal Achievement: 05/02/22 Potential to Achieve Goals: Fair Progress towards PT goals: Progressing toward goals    Frequency    Min 2X/week      PT Plan Current plan remains appropriate    Co-evaluation              AM-PAC PT "6 Clicks" Mobility   Outcome Measure  Help needed turning from your back to your side while in a flat bed without using bedrails?: A Little Help needed moving from lying on your back to sitting on the side of a flat bed without using bedrails?: A Little Help needed moving to and from a bed to a chair (including a wheelchair)?: A Lot Help needed standing up from a chair using your arms (e.g., wheelchair or bedside chair)?: A Little Help needed to walk in hospital room?: Total Help needed climbing 3-5 steps with a railing? : Total 6 Click Score: 13    End of Session Equipment Utilized During Treatment: Gait belt;Oxygen Activity Tolerance: Treatment limited secondary to medical complications (Comment) Patient left: in bed;with call bell/phone within reach;with bed alarm set;with nursing/sitter in room Nurse Communication: Mobility status PT Visit Diagnosis: Unsteadiness on feet (R26.81);Muscle weakness (generalized) (M62.81);History of falling (Z91.81)     Time: 1610-9604 PT Time Calculation (min) (ACUTE ONLY): 15 min  Charges:  $Therapeutic Activity: 8-22 mins                     Greggory Stallion, PT, DPT, GCS [phone]    Matthew Zamora 04/25/2022, 3:31 PM

## 2022-04-25 NOTE — Plan of Care (Signed)

## 2022-04-25 NOTE — Progress Notes (Addendum)
Progress Note    Matthew Zamora  HBZ:[id number] DOB: [birth year]  DOA: 04/17/2022 PCP: Maryland Pink, MD      Brief Narrative:    Medical records reviewed and are as summarized below:   Matthew Zamora is a 81 y.o. male with medical history significant of hypertension, hyperlipidemia, COPD, APF on Eliquis, CHF with EF of 35-40%, CAD, anemia, CKD-3B, frequent fall, who presented on 04/17/2022 for evaluation of dizziness and lightheadedness, generalized weakness, near syncopal episodes.  This occurred in the setting of about one week of GI illness with nausea and vomiting.  In the ED, afebrile and stable vitals.   Labs notable for K 5.5, no leukocytosis, normal lactic acid x 2, AST 129, ALT 136, Tbili 1.4.  BNP mildly elevated 354.5, troponin x 2 normal, negative for Covid-19 and flu A/B.  Admitted to medicine service for further evaluation and management.    Pt later reported severe constipation at home, with associated lower abdominal pain. Improved after enema and having BM's.  11/2: pt with positive orthostatic vitals with severe symptoms of dizziness, unable to tolerate being upright.  Remains with poor PO intake, started on IV fluids.   11/3--5: ongoing N/V, dizziness. GI and cardiology consulted. IV fluids stopped 11/4 due to dyspnea and cxr with mild vascular congestion.  Eliquis held for EGD.  11/6: EGD showed erosive esophagitis, submucosal duodenal nodule - biopsies report was negative for celiac disease, dysplasia or malignancy.        Assessment/Plan:   Principal Problem:   Near syncope Active Problems:   Dyspnea   NSVT (nonsustained ventricular tachycardia) ([name])   Nausea & vomiting   Hyperkalemia   Abnormal LFTs   Orthostatic hypotension   CKD (chronic kidney disease), stage IIIb   Paroxysmal atrial fibrillation ([name])   Chronic systolic CHF (congestive heart failure) ([name])   Essential hypertension   Hyperlipidemia   CAD (coronary artery  disease)   COPD (chronic obstructive pulmonary disease) ([name])   Normocytic anemia   Postural dizziness with near syncope   Hyponatremia   Transaminitis   Dizziness   Intractable nausea and vomiting   Erosive esophagitis   Multifocal pneumonia    Body mass index is 20.95 kg/m.   Postural dizziness with near syncope, orthostatic hypotension: Still orthostatic.  Supine BP 102/51 sitting BP 84/50.  He could not stand for standing BP.  Midodrine was increased to 10 mg 3 times daily on 04/24/2022.  Check cortisol level tomorrow morning  Multifocal pneumonia: Continue IV ceftriaxone (started on 04/23/2022)  Acute hypoxic respiratory failure: Oxygen has been tapered down to 1 L/min via nasal cannula.  Taper off oxygen as able.  Erosive esophagitis on EGD on 04/22/2022: Continue Protonix twice daily indefinitely.  Continue Carafate.  Intractable nausea and vomiting: EGD showed erosive esophagitis.  Outpatient follow-up with GI.  Acute on chronic systolic and diastolic CHF: He was given IV Lasix 04/21/2022 for suspected acute exacerbation of CHF.  2D echo on 04/21/2022 showed EF estimated at 30 to 10%, grade 3 diastolic dysfunction  Paroxysmal atrial fibrillation, NSVT: Central telemetry reported brief run of V-fib followed by nonsustained V. tach 04/19/2022.  Patient has AICD in place Amiodarone decreased from 400 to 200 mg daily.  Metoprolol on hold because of hypotension: Continue Eliquis and mexiletine.  Elevated liver enzymes: Liver enzymes are trending down.  Amiodarone dose has been decreased. Liver ultrasound showed normal gallbladder and CBD.  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  This cyst is stable from previous CT in 2023 in 2021.  AKI on CKD stage IIIb: Creatinine has improved.   Hyponatremia, hyperkalemia: Resolved   Other comorbidities include CAD, COPD, hypertension,         Diet Order             Diet full liquid Room  service appropriate? Yes; Fluid consistency: Thin  Diet effective now                            Consultants: Cardiologist  Procedures: EGD on 04/22/2022    Medications:    amiodarone  200 mg Oral Daily   apixaban  2.5 mg Oral BID   ascorbic acid  500 mg Oral Daily   aspirin  81 mg Oral Q1200   feeding supplement  1 Container Oral TID BM   gabapentin  100 mg Oral QHS   magnesium oxide  400 mg Oral Daily   melatonin  10 mg Oral QHS   mexiletine  150 mg Oral BID   midodrine  10 mg Oral TID WC   mometasone-formoterol  2 puff Inhalation BID   multivitamin with minerals  1 tablet Oral Daily   pantoprazole  40 mg Oral BID   polyethylene glycol  17 g Oral Daily   sertraline  25 mg Oral Daily   sucralfate  1 g Oral TID WC & HS   tamsulosin  0.4 mg Oral Daily   Continuous Infusions:  cefTRIAXone (ROCEPHIN)  IV Stopped (04/24/22 1538)   promethazine (PHENERGAN) injection (IM or IVPB)       Anti-infectives (From admission, onward)    Start     Dose/Rate Route Frequency Ordered Stop   04/23/22 1430  cefTRIAXone (ROCEPHIN) 1 g in sodium chloride 0.9 % 100 mL IVPB        1 g 200 mL/hr over 30 Minutes Intravenous Every 24 hours 04/23/22 1332                Family Communication/Anticipated D/C date and plan/Code Status   DVT prophylaxis: apixaban (ELIQUIS) tablet 2.5 mg Start: 04/23/22 1000 apixaban (ELIQUIS) tablet 2.5 mg     Code Status: Full Code  Family Communication: None Disposition Plan: Plan to discharge to SNF   Status is: Inpatient Remains inpatient appropriate because: On IV antibiotics       Subjective:   Interval events noted.  He still has cough productive of yellowish sputum.  He feels a little dizzy.  Objective:    Vitals:   04/25/22 0337 04/25/22 0657 04/25/22 0900 04/25/22 0915  BP: (!) 109/54   (!) 102/51  Pulse: 88   92  Resp: 17   20  Temp: 98.9 F (37.2 C)   98.7 F (37.1 C)  TempSrc:      SpO2: 95%  (!) 89%  92%  Weight:  62.5 kg    Height:       Orthostatic VS for the past 24 hrs:  BP- Lying Pulse- Lying BP- Sitting Pulse- Sitting  04/25/22 0950 113/46 95 (!) 84/50 100     Intake/Output Summary (Last 24 hours) at 04/25/2022 1133 Last data filed at 04/25/2022 0500 Gross per 24 hour  Intake 100 ml  Output 900 ml  Net -800 ml   Filed Weights   04/23/22 0420 04/24/22 0436 04/25/22 0657  Weight: 62.8 kg 58.9 kg 62.5 kg    Exam:  GEN: NAD SKIN: Warm and dry EYES: EOMI ENT: MMM CV: RRR PULM: Coarse breath sounds, bilateral rales ABD: soft, ND, NT, +BS CNS: AAO x 3, non focal EXT: No edema or tenderness       Data Reviewed:   I have personally reviewed following labs and imaging studies:  Labs: Labs show the following:   Basic Metabolic Panel: Recent Labs  Lab 04/20/22 0544 04/21/22 0548 04/22/22 0458 04/23/22 0458 04/24/22 0457 04/25/22 0432  NA 136 137 136 137  --  138  K 4.3 4.2 3.9 3.8  --  3.5  CL 108 107 103 106  --  106  CO2 '23 24 26 26  '$ --  28  GLUCOSE 95 97 69* 98  --  82  BUN '19 18 23 '$ 27*  --  31*  CREATININE 1.50* 1.48* 1.49* 1.51*  --  1.38*  CALCIUM 8.5* 8.6* 8.5* 8.5*  --  8.4*  MG 1.7 1.9 1.8 1.9 1.9  --   PHOS  --  3.1  --   --   --   --    GFR Estimated Creatinine Clearance: 37.7 mL/min (A) (by C-G formula based on SCr of 1.38 mg/dL (H)). Liver Function Tests: Recent Labs  Lab 04/20/22 0544 04/21/22 0548 04/22/22 0458 04/23/22 0458 04/25/22 0432  AST 331* 411* 459* 277* 208*  ALT 403* 527* 604* 499* 322*  ALKPHOS 75 78 72 72 77  BILITOT 0.6 1.3* 1.8* 1.5* 1.7*  PROT 5.2* 5.3* 5.2* 5.0* 4.7*  ALBUMIN 3.0* 2.8* 2.8* 2.6* 2.3*   No results for input(s): "LIPASE", "AMYLASE" in the last 168 hours. No results for input(s): "AMMONIA" in the last 168 hours. Coagulation profile No results for input(s): "INR", "PROTIME" in the last 168 hours.  CBC: Recent Labs  Lab 04/20/22 0544 04/23/22 1039  WBC 7.9 10.3  HGB 9.8* 9.4*  HCT  29.9* 29.0*  MCV 89.8 89.8  PLT 145* 144*   Cardiac Enzymes: No results for input(s): "CKTOTAL", "CKMB", "CKMBINDEX", "TROPONINI" in the last 168 hours. BNP (last 3 results) No results for input(s): "PROBNP" in the last 8760 hours. CBG: Recent Labs  Lab 04/20/22 0631 04/22/22 1615  GLUCAP 88 92   D-Dimer: No results for input(s): "DDIMER" in the last 72 hours. Hgb A1c: No results for input(s): "HGBA1C" in the last 72 hours. Lipid Profile: No results for input(s): "CHOL", "HDL", "LDLCALC", "TRIG", "CHOLHDL", "LDLDIRECT" in the last 72 hours. Thyroid function studies: No results for input(s): "TSH", "T4TOTAL", "T3FREE", "THYROIDAB" in the last 72 hours.  Invalid input(s): "FREET3" Anemia work up: No results for input(s): "VITAMINB12", "FOLATE", "FERRITIN", "TIBC", "IRON", "RETICCTPCT" in the last 72 hours. Sepsis Labs: Recent Labs  Lab 04/20/22 0544 04/23/22 1039  PROCALCITON  --  0.50  WBC 7.9 10.3    Microbiology Recent Results (from the past 240 hour(s))  Resp Panel by RT-PCR (Flu A&B, Covid) Anterior Nasal Swab     Status: None   Collection Time: 04/17/22 11:05 AM   Specimen: Anterior Nasal Swab  Result Value Ref Range Status   SARS Coronavirus 2 by RT PCR NEGATIVE NEGATIVE Final    Comment: (NOTE) SARS-CoV-2 target nucleic acids are NOT DETECTED.  The SARS-CoV-2 RNA is generally detectable in upper respiratory specimens during the acute phase of infection. The lowest concentration of SARS-CoV-2 viral copies this assay can detect is 138 copies/mL. A negative result does not preclude SARS-Cov-2 infection and should not be used as the sole basis for treatment or other patient  management decisions. A negative result may occur with  improper specimen collection/handling, submission of specimen other than nasopharyngeal swab, presence of viral mutation(s) within the areas targeted by this assay, and inadequate number of viral copies(<138 copies/mL). A negative  result must be combined with clinical observations, patient history, and epidemiological information. The expected result is Negative.  Fact Sheet for Patients:  EntrepreneurPulse.com.au  Fact Sheet for Healthcare Providers:  IncredibleEmployment.be  This test is no t yet approved or cleared by the [place] FDA and  has been authorized for detection and/or diagnosis of SARS-CoV-2 by FDA under an Emergency Use Authorization (EUA). This EUA will remain  in effect (meaning this test can be used) for the duration of the COVID-19 declaration under Section 564(b)(1) of the Act, 21 U.S.C.section 360bbb-3(b)(1), unless the authorization is terminated  or revoked sooner.       Influenza A by PCR NEGATIVE NEGATIVE Final   Influenza B by PCR NEGATIVE NEGATIVE Final    Comment: (NOTE) The Xpert Xpress SARS-CoV-2/FLU/RSV plus assay is intended as an aid in the diagnosis of influenza from Nasopharyngeal swab specimens and should not be used as a sole basis for treatment. Nasal washings and aspirates are unacceptable for Xpert Xpress SARS-CoV-2/FLU/RSV testing.  Fact Sheet for Patients: EntrepreneurPulse.com.au  Fact Sheet for Healthcare Providers: IncredibleEmployment.be  This test is not yet approved or cleared by the [place] FDA and has been authorized for detection and/or diagnosis of SARS-CoV-2 by FDA under an Emergency Use Authorization (EUA). This EUA will remain in effect (meaning this test can be used) for the duration of the COVID-19 declaration under Section 564(b)(1) of the Act, 21 U.S.C. section 360bbb-3(b)(1), unless the authorization is terminated or revoked.  Performed at [facility], [address] [place], [place] [address]     Procedures and diagnostic studies:  No results found.             LOS: 7 days   Herbert Marken  [facility]   Pager on  [email or web address]. If 7PM-7AM, please contact night-coverage at [email or web address]     04/25/2022, 11:33 AM

## 2022-04-26 DIAGNOSIS — I951 Orthostatic hypotension: Secondary | ICD-10-CM | POA: Diagnosis not present

## 2022-04-26 DIAGNOSIS — J189 Pneumonia, unspecified organism: Secondary | ICD-10-CM | POA: Diagnosis not present

## 2022-04-26 DIAGNOSIS — R55 Syncope and collapse: Secondary | ICD-10-CM | POA: Diagnosis not present

## 2022-04-26 LAB — COMPREHENSIVE METABOLIC PANEL
ALT: 309 U/L — ABNORMAL HIGH (ref 0–44)
AST: 219 U/L — ABNORMAL HIGH (ref 15–41)
Albumin: 2.3 g/dL — ABNORMAL LOW (ref 3.5–5.0)
Alkaline Phosphatase: 91 U/L (ref 38–126)
Anion gap: 5 (ref 5–15)
BUN: 33 mg/dL — ABNORMAL HIGH (ref 8–23)
CO2: 28 mmol/L (ref 22–32)
Calcium: 8.7 mg/dL — ABNORMAL LOW (ref 8.9–10.3)
Chloride: 106 mmol/L (ref 98–111)
Creatinine, Ser: 1.4 mg/dL — ABNORMAL HIGH (ref 0.61–1.24)
GFR, Estimated: 51 mL/min — ABNORMAL LOW (ref >60)
Glucose, Bld: 117 mg/dL — ABNORMAL HIGH (ref 70–99)
Potassium: 3.8 mmol/L (ref 3.5–5.1)
Sodium: 139 mmol/L (ref 135–145)
Total Bilirubin: 2 mg/dL — ABNORMAL HIGH (ref 0.3–1.2)
Total Protein: 5 g/dL — ABNORMAL LOW (ref 6.5–8.1)

## 2022-04-26 LAB — CBC
HCT: 28.4 % — ABNORMAL LOW (ref 39.0–52.0)
Hemoglobin: 9.4 g/dL — ABNORMAL LOW (ref 13.0–17.0)
MCH: 29.6 pg (ref 26.0–34.0)
MCHC: 33.1 g/dL (ref 30.0–36.0)
MCV: 89.3 fL (ref 80.0–100.0)
Platelets: 190 10*3/uL (ref 150–400)
RBC: 3.18 MIL/uL — ABNORMAL LOW (ref 4.22–5.81)
RDW: 14.4 % (ref 11.5–15.5)
WBC: 15.7 10*3/uL — ABNORMAL HIGH (ref 4.0–10.5)
nRBC: 0 % (ref 0.0–0.2)

## 2022-04-26 LAB — CORTISOL-AM, BLOOD: Cortisol - AM: 25.7 ug/dL — ABNORMAL HIGH (ref 6.7–22.6)

## 2022-04-26 MED ORDER — PYRIDOSTIGMINE BROMIDE 60 MG PO TABS
60.0000 mg | ORAL_TABLET | Freq: Three times a day (TID) | ORAL | Status: DC
  Administered 2022-04-26 – 2022-04-29 (×9): 60 mg via ORAL
  Filled 2022-04-26 (×10): qty 1

## 2022-04-26 NOTE — Care Management Important Message (Signed)
Important Message  Patient Details  Name: CASHIUS GRANDSTAFF MRN: [id number] Date of Birth: 12-Oct-1940   Medicare Important Message Given:  Yes     Juliann Pulse A Read Bonelli 04/26/2022, 3:16 PM

## 2022-04-26 NOTE — TOC Progression Note (Addendum)
Transition of Care [facility]) - Progression Note    Patient Details  Name: Matthew Zamora MRN: [id number] Date of Birth: 05/11/1941  Transition of Care [facility] CM/SW [place], [place] Phone Number: 04/26/2022, 8:33 AM  Clinical Narrative:     Additional SNF referrals sent out in [facility]. Patient awaiting a SNF acceptance.  1438: Patient has been accepted to [facility].  1608: Patient is agreeable to [facility] SNF in [place]. TOC will start authorization process.   Expected Discharge Plan: [place] Barriers to Discharge: Continued Medical Work up  Expected Discharge Plan and Services Expected Discharge Plan: [place]   Discharge Planning Services: CM Consult                                           Social Determinants of Health (SDOH) Interventions    Readmission Risk Interventions    04/22/2022    3:54 PM  Readmission Risk Prevention Plan  Transportation Screening Complete  Medication Review ([place]) Complete  PCP or Specialist appointment within 3-5 days of discharge Complete  HRI or [place] Complete  SW Recovery Care/Counseling Consult Complete  [place] Not Applicable

## 2022-04-26 NOTE — Progress Notes (Signed)
Occupational Therapy Treatment Patient Details Name: Matthew Zamora MRN: [id number] DOB: November 16, 1940 Today's Date: 04/26/2022   History of present illness Pt is an 81 year old male presenting to ED with lightheadedness; PMH significant for  hypertension, hyperlipidemia, COPD, APF on Eliquis, CHF with EF of 35-40%, CAD, anemia, CKD-3B, and frequent falls.   OT comments  Pt seen for OT tx. Pt noted to have had BM in bed. Nurse tech came to assist. MAX A for pericare in sidelying with pt able to roll without direct assist and maintain sidelying well, PRN VC for PLB. Pt endorsing mild SOB and lightheadedness at start of session, worsening slightly with sitting EOB. Vitals taken but did not save unfortunately. BP in 100's/50's supine HR 78, sitting BP low 90's/50's and pt increasingly symptomatic requiring MOD A for brief lateral scoot EOB to improve positioning prior to return to supine. Pt repositioned for comfort. Continue to recommend SNF at this time. Pt making slow progress towards goals, limited by low BP and symptomatic with positional changes.    Recommendations for follow up therapy are one component of a multi-disciplinary discharge planning process, led by the attending physician.  Recommendations may be updated based on patient status, additional functional criteria and insurance authorization.    Follow Up Recommendations  Skilled nursing-short term rehab (<3 hours/day)    Assistance Recommended at Discharge Frequent or constant Supervision/Assistance  Patient can return home with the following  A lot of help with walking and/or transfers;A lot of help with bathing/dressing/bathroom   Equipment Recommendations  Other (comment) (defer to next venue)    Recommendations for Other Services      Precautions / Restrictions Precautions Precautions: Fall Precaution Comments: orthostatic Restrictions Weight Bearing Restrictions: No       Mobility Bed Mobility Overal bed  mobility: Needs Assistance Bed Mobility: Rolling, Supine to Sit, Sit to Supine Rolling: Supervision   Supine to sit: Min assist Sit to supine: Min guard   General bed mobility comments: MIN A to come upright    Transfers Overall transfer level: Needs assistance Equipment used: None Transfers: Bed to chair/wheelchair/BSC            Lateral/Scoot Transfers: Mod assist General transfer comment: MOD A for lateral scoot EOB, unsafe to attempt standing 2/2 dizziness and low BP     Balance Overall balance assessment: History of Falls, Needs assistance Sitting-balance support: Feet supported, Bilateral upper extremity supported Sitting balance-Leahy Scale: Poor Sitting balance - Comments: R lateral sway x2 requiring MIN-MOD A to correct                                   ADL either performed or assessed with clinical judgement   ADL Overall ADL's : Needs assistance/impaired                           Toilet Transfer Details (indicate cue type and reason): unsafe to attempt on this date due to orthostatics, log rolled in bed Toileting- Clothing Manipulation and Hygiene: Maximal assistance;Bed level Toileting - Clothing Manipulation Details (indicate cue type and reason): with assist from NT for linens change            Extremity/Trunk Assessment              Vision       Perception     Praxis  Cognition Arousal/Alertness: Awake/alert Behavior During Therapy: WFL for tasks assessed/performed Overall Cognitive Status: Within Functional Limits for tasks assessed                                          Exercises      Shoulder Instructions       General Comments      Pertinent Vitals/ Pain       Pain Assessment Pain Assessment: No/denies pain  Home Living                                          Prior Functioning/Environment              Frequency  Min 2X/week        Progress  Toward Goals  OT Goals(current goals can now be found in the care plan section)  Progress towards OT goals: OT to reassess next treatment;Not progressing toward goals - comment (limited 2/2 orthostatic)  Acute Rehab OT Goals Patient Stated Goal: to feel better OT Goal Formulation: With patient Time For Goal Achievement: 05/02/22 Potential to Achieve Goals: Good  Plan Discharge plan remains appropriate;Frequency remains appropriate    Co-evaluation                 AM-PAC OT "6 Clicks" Daily Activity     Outcome Measure   Help from another person eating meals?: None Help from another person taking care of personal grooming?: A Little Help from another person toileting, which includes using toliet, bedpan, or urinal?: A Lot Help from another person bathing (including washing, rinsing, drying)?: A Lot Help from another person to put on and taking off regular upper body clothing?: A Little Help from another person to put on and taking off regular lower body clothing?: A Lot 6 Click Score: 16    End of Session Equipment Utilized During Treatment: Oxygen  OT Visit Diagnosis: Unsteadiness on feet (R26.81);Muscle weakness (generalized) (M62.81);Repeated falls (R29.6)   Activity Tolerance Patient limited by fatigue;Other (comment) (low BP)   Patient Left in bed;with call bell/phone within reach;with bed alarm set   Nurse Communication Mobility status;Other (comment) (wants to be weighed)        Time: 2197-5883 OT Time Calculation (min): 36 min  Charges: OT General Charges $OT Visit: 1 Visit OT Treatments $Self Care/Home Management : 23-37 mins  Ardeth Perfect., MPH, MS, OTR/L ascom [phone] 04/26/22, 4:22 PM

## 2022-04-26 NOTE — Progress Notes (Signed)
Physical Therapy Treatment Patient Details Name: Matthew Zamora MRN: [id number] DOB: 1941/03/29 Today's Date: 04/26/2022   History of Present Illness Pt is an 81 year old male presenting to ED with lightheadedness; PMH significant for  hypertension, hyperlipidemia, COPD, APF on Eliquis, CHF with EF of 35-40%, CAD, anemia, CKD-3B, and frequent falls.    PT Comments    Pt was pleasant and motivated to participate during the session and put forth good effort throughout. Pt required no physical assistance with sup to/from sit this session but did require min A to scoot forward while in sitting to get feet to the floor.  Pt reported no adverse symptoms in sitting with BP taken in sitting at 113/98 compared to most recent supine BP of 114/54.  Pt able to stand with min A from an elevated EOB but stood with pronounced trunk flex and min knee flex as well as heavy lean on his UEs on the walker and was unable to come to full upright standing even with assist.  Pt was able to remain in standing 2 x 30 sec with seated rest break between and reported only min dizziness in standing, unable to obtain standing BP secondary to pt's need of assist in standing and heavy lean on his UE's.  Pt will benefit from PT services in a SNF setting upon discharge to safely address deficits listed in patient problem list for decreased caregiver assistance and eventual return to PLOF.     Recommendations for follow up therapy are one component of a multi-disciplinary discharge planning process, led by the attending physician.  Recommendations may be updated based on patient status, additional functional criteria and insurance authorization.  Follow Up Recommendations  Skilled nursing-short term rehab (<3 hours/day) Can patient physically be transported by private vehicle: No   Assistance Recommended at Discharge Frequent or constant Supervision/Assistance  Patient can return home with the following A little help with  walking and/or transfers;A little help with bathing/dressing/bathroom;Assist for transportation;Help with stairs or ramp for entrance;Assistance with cooking/housework   Equipment Recommendations  None recommended by PT    Recommendations for Other Services       Precautions / Restrictions Precautions Precautions: Fall Precaution Comments: orthostatic Restrictions Weight Bearing Restrictions: No     Mobility  Bed Mobility Overal bed mobility: Modified Independent       Supine to sit: Modified independent (Device/Increase time) Sit to supine: Modified independent (Device/Increase time)   General bed mobility comments: Extra time and effort and use of rails but no physical assistance needed this session with sup to/from sit    Transfers Overall transfer level: Needs assistance Equipment used: Rolling walker (2 wheels) Transfers: Sit to/from Stand Sit to Stand: Min assist, From elevated surface           General transfer comment: Pt able to come to standing with min A from an elevated EOB but was unable to stand upright, stood with pronounced trunk flexion and min knee flexion    Ambulation/Gait               General Gait Details: Unable to advance either LE   Stairs             Wheelchair Mobility    Modified Rankin (Stroke Patients Only)       Balance Overall balance assessment: History of Falls, Needs assistance Sitting-balance support: Feet supported, Bilateral upper extremity supported Sitting balance-Leahy Scale: Fair     Standing balance support: Bilateral upper extremity supported, Reliant on  assistive device for balance Standing balance-Leahy Scale: Poor                              Cognition Arousal/Alertness: Awake/alert Behavior During Therapy: WFL for tasks assessed/performed Overall Cognitive Status: Within Functional Limits for tasks assessed                                          Exercises  Total Joint Exercises Ankle Circles/Pumps: Strengthening, Both, 5 reps, 10 reps (with manual resistance) Quad Sets: Strengthening, Both, 10 reps, 5 reps Heel Slides: Strengthening, Both, 10 reps Long Arc Quad: Strengthening, Both, 5 reps, 10 reps Knee Flexion: Strengthening, 5 reps, 10 reps, Both Bridges: Strengthening, Both, 5 reps (low amplitude) Static standing at EOB 2 x 30 sec Rolling L/R for core therex Supine leg press to BLE's with manual resistance    General Comments        Pertinent Vitals/Pain Pain Assessment Pain Assessment: No/denies pain    Home Living                          Prior Function            PT Goals (current goals can now be found in the care plan section) Progress towards PT goals: PT to reassess next treatment    Frequency    Min 2X/week      PT Plan Current plan remains appropriate    Co-evaluation              AM-PAC PT "6 Clicks" Mobility   Outcome Measure  Help needed turning from your back to your side while in a flat bed without using bedrails?: A Little Help needed moving from lying on your back to sitting on the side of a flat bed without using bedrails?: A Little Help needed moving to and from a bed to a chair (including a wheelchair)?: A Lot Help needed standing up from a chair using your arms (e.g., wheelchair or bedside chair)?: A Little Help needed to walk in hospital room?: Total Help needed climbing 3-5 steps with a railing? : Total 6 Click Score: 13    End of Session Equipment Utilized During Treatment: Gait belt;Oxygen Activity Tolerance: Other (comment) (mild dizziness in standing) Patient left: in bed;with call bell/phone within reach;with bed alarm set Nurse Communication: Mobility status;Other (comment) (seated BP per above and mildly dizzy in standing) PT Visit Diagnosis: Unsteadiness on feet (R26.81);Muscle weakness (generalized) (M62.81);History of falling (Z91.81)     Time: 7867-5449 PT  Time Calculation (min) (ACUTE ONLY): 25 min  Charges:  $Therapeutic Exercise: 8-22 mins $Therapeutic Activity: 8-22 mins                     D. Scott Paz Fuentes PT, DPT 04/26/22, 5:31 PM

## 2022-04-26 NOTE — Progress Notes (Signed)
Progress Note    Matthew Zamora  GUR:[id number] DOB: [birth year]  DOA: 04/17/2022 PCP: [place] Pink, MD      Brief Narrative:    Medical records reviewed and are as summarized below:   Matthew Zamora is a 81 y.o. male with medical history significant of hypertension, hyperlipidemia, COPD, APF on Eliquis, CHF with EF of 35-40%, CAD, anemia, CKD-3B, frequent fall, who presented on 04/17/2022 for evaluation of dizziness and lightheadedness, generalized weakness, near syncopal episodes.  This occurred in the setting of about one week of GI illness with nausea and vomiting.  In the ED, afebrile and stable vitals.   Labs notable for K 5.5, no leukocytosis, normal lactic acid x 2, AST 129, ALT 136, Tbili 1.4.  BNP mildly elevated 354.5, troponin x 2 normal, negative for Covid-19 and flu A/B.  Admitted to medicine service for further evaluation and management.    Pt later reported severe constipation at home, with associated lower abdominal pain. Improved after enema and having BM's.  11/2: pt with positive orthostatic vitals with severe symptoms of dizziness, unable to tolerate being upright.  Remains with poor PO intake, started on IV fluids.   11/3--5: ongoing N/V, dizziness. GI and cardiology consulted. IV fluids stopped 11/4 due to dyspnea and cxr with mild vascular congestion.  Eliquis held for EGD.  11/6: EGD showed erosive esophagitis, submucosal duodenal nodule - biopsies report was negative for celiac disease, dysplasia or malignancy.        Assessment/Plan:   Principal Problem:   Near syncope Active Problems:   Dyspnea   NSVT (nonsustained ventricular tachycardia) ([name])   Nausea & vomiting   Hyperkalemia   Abnormal LFTs   Orthostatic hypotension   CKD (chronic kidney disease), stage IIIb   Paroxysmal atrial fibrillation ([name])   Chronic systolic CHF (congestive heart failure) ([name])   Essential hypertension   Hyperlipidemia   CAD (coronary artery  disease)   COPD (chronic obstructive pulmonary disease) ([name])   Normocytic anemia   Postural dizziness with near syncope   Hyponatremia   Transaminitis   Dizziness   Intractable nausea and vomiting   Erosive esophagitis   Multifocal pneumonia    Body mass index is 21.08 kg/m.   Postural dizziness with near syncope, orthostatic hypotension: Cortisol level was 25.7.  Continue midodrine. Discussed the addition of Pyridostigmine to see if it will help. Risks and benefits were discussed. Patient is willing to give it a try.    Multifocal pneumonia: Continue IV ceftriaxone through 04/27/2022 (started on 04/23/2022).    Acute hypoxic respiratory failure: Continue 1 L/min oxygen via [place] O2 and taper off as able  Erosive esophagitis on EGD on 04/22/2022: Continue Protonix twice daily indefinitely.  Continue Carafate.  Intractable nausea and vomiting: Improved. EGD showed erosive esophagitis.  Outpatient follow-up with GI.  Acute on chronic systolic and diastolic CHF: Consider resuming home dose of torsemide He was given IV Lasix 04/21/2022 for suspected acute exacerbation of CHF.  2D echo on 04/21/2022 showed EF estimated at 30 to 28%, grade 3 diastolic dysfunction  Paroxysmal atrial fibrillation, NSVT: Central telemetry reported brief run of V-fib followed by nonsustained V. tach 04/19/2022.  Patient has AICD in place Amiodarone decreased from 400 to 200 mg daily.  Metoprolol on hold because of hypotension: Continue Eliquis and mexiletine.  Elevated liver enzymes: Liver enzymes are trending down.  Amiodarone dose has been decreased. Liver ultrasound showed normal gallbladder and CBD.  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  This cyst is stable from previous CT in 2023 in 2021.  AKI on CKD stage IIIb: Creatinine has improved.   Hyponatremia, hyperkalemia: Resolved   Other comorbidities include CAD, COPD, hypertension,         Diet Order              Diet full liquid Room service appropriate? Yes; Fluid consistency: Thin  Diet effective now                            Consultants: Cardiologist  Procedures: EGD on 04/22/2022    Medications:    amiodarone  200 mg Oral Daily   apixaban  2.5 mg Oral BID   ascorbic acid  500 mg Oral Daily   aspirin  81 mg Oral Q1200   feeding supplement  1 Container Oral TID BM   gabapentin  100 mg Oral QHS   magnesium oxide  400 mg Oral Daily   melatonin  10 mg Oral QHS   mexiletine  150 mg Oral BID   midodrine  10 mg Oral TID WC   mometasone-formoterol  2 puff Inhalation BID   multivitamin with minerals  1 tablet Oral Daily   pantoprazole  40 mg Oral BID   polyethylene glycol  17 g Oral Daily   pyridostigmine  60 mg Oral Q8H   sertraline  25 mg Oral Daily   sucralfate  1 g Oral TID WC & HS   tamsulosin  0.4 mg Oral Daily   Continuous Infusions:  cefTRIAXone (ROCEPHIN)  IV 1 g (04/25/22 1442)   promethazine (PHENERGAN) injection (IM or IVPB)       Anti-infectives (From admission, onward)    Start     Dose/Rate Route Frequency Ordered Stop   04/23/22 1430  cefTRIAXone (ROCEPHIN) 1 g in sodium chloride 0.9 % 100 mL IVPB        1 g 200 mL/hr over 30 Minutes Intravenous Every 24 hours 04/23/22 1332                Family Communication/Anticipated D/C date and plan/Code Status   DVT prophylaxis: apixaban (ELIQUIS) tablet 2.5 mg Start: 04/23/22 1000 apixaban (ELIQUIS) tablet 2.5 mg     Code Status: Full Code  Family Communication: None Disposition Plan: Plan to discharge to SNF   Status is: Inpatient Remains inpatient appropriate because: On IV antibiotics       Subjective:   He c/o cough, general weakness and dizziness on sitting or standing  Objective:    Vitals:   04/26/22 0500 04/26/22 0510 04/26/22 0906 04/26/22 1248  BP:  (!) 119/57 104/69 (!) 114/54  Pulse:  87 87 80  Resp:  '20 15 18  '$ Temp:  98.4 F (36.9 C) (!) 97.3 F  (36.3 C) 98.5 F (36.9 C)  TempSrc:  Oral    SpO2:  94% 93% 95%  Weight: 62.9 kg     Height:       Orthostatic VS for the past 24 hrs:  BP- Lying Pulse- Lying BP- Sitting Pulse- Sitting BP- Standing at 0 minutes Pulse- Standing at 0 minutes  04/25/22 1426 92/48 84 (!) 73/45 89 (!) 68/51 91    No intake or output data in the 24 hours ending 04/26/22 1413  Filed Weights   04/24/22 0436 04/25/22 0657 04/26/22 0500  Weight: 58.9 kg 62.5 kg 62.9 kg  Exam:  GEN: NAD SKIN: Warm and dry EYES: EOMI ENT: MMM CV: RRR PULM: Bibasilar rales, coarse breath sounds ABD: soft, ND, NT, +BS CNS: AAO x 3, non focal EXT: No edema or tenderness        Data Reviewed:   I have personally reviewed following labs and imaging studies:  Labs: Labs show the following:   Basic Metabolic Panel: Recent Labs  Lab 04/20/22 0544 04/21/22 0548 04/22/22 0458 04/23/22 0458 04/24/22 0457 04/25/22 0432 04/26/22 0346  NA 136 137 136 137  --  138 139  K 4.3 4.2 3.9 3.8  --  3.5 3.8  CL 108 107 103 106  --  106 106  CO2 '23 24 26 26  '$ --  28 28  GLUCOSE 95 97 69* 98  --  82 117*  BUN '19 18 23 '$ 27*  --  31* 33*  CREATININE 1.50* 1.48* 1.49* 1.51*  --  1.38* 1.40*  CALCIUM 8.5* 8.6* 8.5* 8.5*  --  8.4* 8.7*  MG 1.7 1.9 1.8 1.9 1.9  --   --   PHOS  --  3.1  --   --   --   --   --    GFR Estimated Creatinine Clearance: 37.4 mL/min (A) (by C-G formula based on SCr of 1.4 mg/dL (H)). Liver Function Tests: Recent Labs  Lab 04/21/22 0548 04/22/22 0458 04/23/22 0458 04/25/22 0432 04/26/22 0346  AST 411* 459* 277* 208* 219*  ALT 527* 604* 499* 322* 309*  ALKPHOS 78 72 72 77 91  BILITOT 1.3* 1.8* 1.5* 1.7* 2.0*  PROT 5.3* 5.2* 5.0* 4.7* 5.0*  ALBUMIN 2.8* 2.8* 2.6* 2.3* 2.3*   No results for input(s): "LIPASE", "AMYLASE" in the last 168 hours. No results for input(s): "AMMONIA" in the last 168 hours. Coagulation profile No results for input(s): "INR", "PROTIME" in the last 168  hours.  CBC: Recent Labs  Lab 04/20/22 0544 04/23/22 1039 04/26/22 0346  WBC 7.9 10.3 15.7*  HGB 9.8* 9.4* 9.4*  HCT 29.9* 29.0* 28.4*  MCV 89.8 89.8 89.3  PLT 145* 144* 190   Cardiac Enzymes: No results for input(s): "CKTOTAL", "CKMB", "CKMBINDEX", "TROPONINI" in the last 168 hours. BNP (last 3 results) No results for input(s): "PROBNP" in the last 8760 hours. CBG: Recent Labs  Lab 04/20/22 0631 04/22/22 1615  GLUCAP 88 92   D-Dimer: No results for input(s): "DDIMER" in the last 72 hours. Hgb A1c: No results for input(s): "HGBA1C" in the last 72 hours. Lipid Profile: No results for input(s): "CHOL", "HDL", "LDLCALC", "TRIG", "CHOLHDL", "LDLDIRECT" in the last 72 hours. Thyroid function studies: No results for input(s): "TSH", "T4TOTAL", "T3FREE", "THYROIDAB" in the last 72 hours.  Invalid input(s): "FREET3" Anemia work up: No results for input(s): "VITAMINB12", "FOLATE", "FERRITIN", "TIBC", "IRON", "RETICCTPCT" in the last 72 hours. Sepsis Labs: Recent Labs  Lab 04/20/22 0544 04/23/22 1039 04/26/22 0346  PROCALCITON  --  0.50  --   WBC 7.9 10.3 15.7*    Microbiology Recent Results (from the past 240 hour(s))  Resp Panel by RT-PCR (Flu A&B, Covid) Anterior Nasal Swab     Status: None   Collection Time: 04/17/22 11:05 AM   Specimen: Anterior Nasal Swab  Result Value Ref Range Status   SARS Coronavirus 2 by RT PCR NEGATIVE NEGATIVE Final    Comment: (NOTE) SARS-CoV-2 target nucleic acids are NOT DETECTED.  The SARS-CoV-2 RNA is generally detectable in upper respiratory specimens during the acute phase of infection. The lowest concentration of  SARS-CoV-2 viral copies this assay can detect is 138 copies/mL. A negative result does not preclude SARS-Cov-2 infection and should not be used as the sole basis for treatment or other patient management decisions. A negative result may occur with  improper specimen collection/handling, submission of specimen  other than nasopharyngeal swab, presence of viral mutation(s) within the areas targeted by this assay, and inadequate number of viral copies(<138 copies/mL). A negative result must be combined with clinical observations, patient history, and epidemiological information. The expected result is Negative.  Fact Sheet for Patients:  EntrepreneurPulse.com.au  Fact Sheet for Healthcare Providers:  IncredibleEmployment.be  This test is no t yet approved or cleared by the [place] FDA and  has been authorized for detection and/or diagnosis of SARS-CoV-2 by FDA under an Emergency Use Authorization (EUA). This EUA will remain  in effect (meaning this test can be used) for the duration of the COVID-19 declaration under Section 564(b)(1) of the Act, 21 U.S.C.section 360bbb-3(b)(1), unless the authorization is terminated  or revoked sooner.       Influenza A by PCR NEGATIVE NEGATIVE Final   Influenza B by PCR NEGATIVE NEGATIVE Final    Comment: (NOTE) The Xpert Xpress SARS-CoV-2/FLU/RSV plus assay is intended as an aid in the diagnosis of influenza from Nasopharyngeal swab specimens and should not be used as a sole basis for treatment. Nasal washings and aspirates are unacceptable for Xpert Xpress SARS-CoV-2/FLU/RSV testing.  Fact Sheet for Patients: EntrepreneurPulse.com.au  Fact Sheet for Healthcare Providers: IncredibleEmployment.be  This test is not yet approved or cleared by the [place] FDA and has been authorized for detection and/or diagnosis of SARS-CoV-2 by FDA under an Emergency Use Authorization (EUA). This EUA will remain in effect (meaning this test can be used) for the duration of the COVID-19 declaration under Section 564(b)(1) of the Act, 21 U.S.C. section 360bbb-3(b)(1), unless the authorization is terminated or revoked.  Performed at [facility], [name]  [place], [place] [address]     Procedures and diagnostic studies:  No results found.             LOS: 8 days   Manya Balash  [facility]   Pager on [email or web address]. If 7PM-7AM, please contact night-coverage at [email or web address]     04/26/2022, 2:13 PM

## 2022-04-27 DIAGNOSIS — J189 Pneumonia, unspecified organism: Secondary | ICD-10-CM | POA: Diagnosis not present

## 2022-04-27 DIAGNOSIS — I951 Orthostatic hypotension: Secondary | ICD-10-CM | POA: Diagnosis not present

## 2022-04-27 DIAGNOSIS — R112 Nausea with vomiting, unspecified: Secondary | ICD-10-CM | POA: Diagnosis not present

## 2022-04-27 DIAGNOSIS — R55 Syncope and collapse: Secondary | ICD-10-CM | POA: Diagnosis not present

## 2022-04-27 LAB — BASIC METABOLIC PANEL
Anion gap: 5 (ref 5–15)
BUN: 33 mg/dL — ABNORMAL HIGH (ref 8–23)
CO2: 26 mmol/L (ref 22–32)
Calcium: 8.7 mg/dL — ABNORMAL LOW (ref 8.9–10.3)
Chloride: 109 mmol/L (ref 98–111)
Creatinine, Ser: 1.39 mg/dL — ABNORMAL HIGH (ref 0.61–1.24)
GFR, Estimated: 51 mL/min — ABNORMAL LOW (ref >60)
Glucose, Bld: 104 mg/dL — ABNORMAL HIGH (ref 70–99)
Potassium: 3.7 mmol/L (ref 3.5–5.1)
Sodium: 140 mmol/L (ref 135–145)

## 2022-04-27 LAB — CBC WITH DIFFERENTIAL/PLATELET
Abs Immature Granulocytes: 0.16 10*3/uL — ABNORMAL HIGH (ref 0.00–0.07)
Basophils Absolute: 0 10*3/uL (ref 0.0–0.1)
Basophils Relative: 0 %
Eosinophils Absolute: 0 10*3/uL (ref 0.0–0.5)
Eosinophils Relative: 0 %
HCT: 27.6 % — ABNORMAL LOW (ref 39.0–52.0)
Hemoglobin: 9 g/dL — ABNORMAL LOW (ref 13.0–17.0)
Immature Granulocytes: 1 %
Lymphocytes Relative: 3 %
Lymphs Abs: 0.4 10*3/uL — ABNORMAL LOW (ref 0.7–4.0)
MCH: 29.2 pg (ref 26.0–34.0)
MCHC: 32.6 g/dL (ref 30.0–36.0)
MCV: 89.6 fL (ref 80.0–100.0)
Monocytes Absolute: 0.7 10*3/uL (ref 0.1–1.0)
Monocytes Relative: 5 %
Neutro Abs: 11.2 10*3/uL — ABNORMAL HIGH (ref 1.7–7.7)
Neutrophils Relative %: 91 %
Platelets: 197 10*3/uL (ref 150–400)
RBC: 3.08 MIL/uL — ABNORMAL LOW (ref 4.22–5.81)
RDW: 14.5 % (ref 11.5–15.5)
WBC: 12.5 10*3/uL — ABNORMAL HIGH (ref 4.0–10.5)
nRBC: 0 % (ref 0.0–0.2)

## 2022-04-27 MED ORDER — AMOXICILLIN-POT CLAVULANATE 875-125 MG PO TABS
1.0000 | ORAL_TABLET | Freq: Two times a day (BID) | ORAL | Status: DC
  Administered 2022-04-27 – 2022-04-28 (×3): 1 via ORAL
  Filled 2022-04-27 (×3): qty 1

## 2022-04-27 MED ORDER — CALCIUM CARBONATE ANTACID 500 MG PO CHEW
1.0000 | CHEWABLE_TABLET | Freq: Every day | ORAL | Status: DC | PRN
  Administered 2022-04-27 – 2022-05-12 (×4): 200 mg via ORAL
  Filled 2022-04-27 (×4): qty 1

## 2022-04-27 MED ORDER — POLYVINYL ALCOHOL 1.4 % OP SOLN
2.0000 [drp] | OPHTHALMIC | Status: DC | PRN
  Administered 2022-04-27 – 2022-05-08 (×2): 2 [drp] via OPHTHALMIC
  Filled 2022-04-27: qty 15

## 2022-04-27 MED ORDER — TORSEMIDE 20 MG PO TABS
20.0000 mg | ORAL_TABLET | Freq: Every day | ORAL | Status: DC
  Administered 2022-04-27 – 2022-04-28 (×2): 20 mg via ORAL
  Filled 2022-04-27 (×2): qty 1

## 2022-04-27 MED ORDER — HYDROCODONE BIT-HOMATROP MBR 5-1.5 MG/5ML PO SOLN
5.0000 mL | Freq: Four times a day (QID) | ORAL | Status: DC | PRN
  Administered 2022-04-28 – 2022-05-02 (×4): 5 mL via ORAL
  Filled 2022-04-27 (×4): qty 5

## 2022-04-27 NOTE — Progress Notes (Signed)
Patient had a restless night due to uncontrol productive coughing with large amounts of phlegm with one episode of emesis.

## 2022-04-27 NOTE — TOC Progression Note (Signed)
Transition of Care [facility]) - Progression Note    Patient Details  Name: Matthew Zamora MRN: [id number] Date of Birth: [birth year]  Transition of Care [place]) CM/SW [place], LCSW Phone Number: 04/27/2022, 11:56 AM  Clinical Narrative:    Checked with MD, per MD patient may be medically ready Tuesday or Wednesday. TOC will continue to follow and start insurance auth when appropriate. Updated TOC handoff.    Expected Discharge Plan: [place] Barriers to Discharge: Continued Medical Work up  Expected Discharge Plan and Services Expected Discharge Plan: [place]   Discharge Planning Services: CM Consult                                           Social Determinants of Health (SDOH) Interventions    Readmission Risk Interventions    04/22/2022    3:54 PM  Readmission Risk Prevention Plan  Transportation Screening Complete  Medication Review ([place]) Complete  PCP or Specialist appointment within 3-5 days of discharge Complete  HRI or [place] Complete  SW Recovery Care/Counseling Consult Complete  [place] Not Applicable

## 2022-04-27 NOTE — Progress Notes (Signed)
Progress Note    [name]  WJX:[id number] DOB: 05/06/1941  DOA: 04/17/2022 PCP: Maryland Pink, MD      Brief Narrative:    Medical records reviewed and are as summarized below:   Matthew Zamora is a 81 y.o. male with medical history significant of hypertension, hyperlipidemia, COPD, APF on Eliquis, CHF with EF of 35-40%, CAD, anemia, CKD-3B, frequent fall, who presented on 04/17/2022 for evaluation of dizziness and lightheadedness, generalized weakness, near syncopal episodes.  This occurred in the setting of about one week of GI illness with nausea and vomiting.  In the ED, afebrile and stable vitals.   Labs notable for K 5.5, no leukocytosis, normal lactic acid x 2, AST 129, ALT 136, Tbili 1.4.  BNP mildly elevated 354.5, troponin x 2 normal, negative for Covid-19 and flu A/B.  Admitted to medicine service for further evaluation and management.    Pt later reported severe constipation at home, with associated lower abdominal pain. Improved after enema and having BM's.  11/2: pt with positive orthostatic vitals with severe symptoms of dizziness, unable to tolerate being upright.  Remains with poor PO intake, started on IV fluids.   11/3--5: ongoing N/V, dizziness. GI and cardiology consulted. IV fluids stopped 11/4 due to dyspnea and cxr with mild vascular congestion.  Eliquis held for EGD.  11/6: EGD showed erosive esophagitis, submucosal duodenal nodule - biopsies report was negative for celiac disease, dysplasia or malignancy.        Assessment/Plan:   Principal Problem:   Near syncope Active Problems:   Dyspnea   NSVT (nonsustained ventricular tachycardia) ([name])   Nausea & vomiting   Hyperkalemia   Abnormal LFTs   Orthostatic hypotension   CKD (chronic kidney disease), stage IIIb   Paroxysmal atrial fibrillation ([name])   Chronic systolic CHF (congestive heart failure) ([name])   Essential hypertension   Hyperlipidemia   CAD (coronary artery  disease)   COPD (chronic obstructive pulmonary disease) ([name])   Normocytic anemia   Postural dizziness with near syncope   Hyponatremia   Transaminitis   Dizziness   Intractable nausea and vomiting   Erosive esophagitis   Multifocal pneumonia    Body mass index is 20.05 kg/m.   Postural dizziness with near syncope, orthostatic hypotension:  Supine BP 127/51, sitting BP 100/38 and standing BP 103/48.  BP difference seems to be getting narrow compared to previous orthostatic vital signs. Continue midodrine and pyridostigmine. Cortisol level was 25.7.    Multifocal pneumonia: Discontinue IV ceftriaxone (started on 04/23/2022).  Start Augmentin  Acute hypoxic respiratory failure: Continue 1 L/min oxygen via [place] O[place] and taper off as able  Erosive esophagitis on EGD on 04/22/2022: Continue Protonix twice daily indefinitely.  Continue Carafate.  Intractable nausea and vomiting: Continue IV Zofran and Phenergan as needed.  EGD showed erosive esophagitis.  Outpatient follow-up with GI.  Acute on chronic systolic and diastolic CHF: Resume home torsemide.  He was given IV Lasix 04/21/2022 for suspected acute exacerbation of CHF.  2D echo on 04/21/2022 showed EF estimated at 30 to 21%, grade 3 diastolic dysfunction  Paroxysmal atrial fibrillation, NSVT: Central telemetry reported brief run of V-fib followed by nonsustained V. tach 04/19/2022.  Patient has AICD in place Amiodarone decreased from 400 to 200 mg daily.  Metoprolol on hold because of hypotension: Continue Eliquis and mexiletine.  Elevated liver enzymes: Liver enzymes are trending down.  Amiodarone dose has been decreased. Liver ultrasound showed normal gallbladder and CBD.  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  This cyst is stable from previous CT in 2023 in 2021.  AKI on CKD stage IIIb: Creatinine has improved.   Hyponatremia, hyperkalemia: Resolved   Other comorbidities include CAD,  COPD, hypertension,         Diet Order             Diet full liquid Room service appropriate? Yes; Fluid consistency: Thin  Diet effective now                            Consultants: Cardiologist  Procedures: EGD on 04/22/2022    Medications:    amiodarone  200 mg Oral Daily   amoxicillin-clavulanate  1 tablet Oral Q12H   apixaban  2.5 mg Oral BID   ascorbic acid  500 mg Oral Daily   aspirin  81 mg Oral Q1200   feeding supplement  1 Container Oral TID BM   gabapentin  100 mg Oral QHS   magnesium oxide  400 mg Oral Daily   melatonin  10 mg Oral QHS   mexiletine  150 mg Oral BID   midodrine  10 mg Oral TID WC   mometasone-formoterol  2 puff Inhalation BID   multivitamin with minerals  1 tablet Oral Daily   pantoprazole  40 mg Oral BID   polyethylene glycol  17 g Oral Daily   pyridostigmine  60 mg Oral Q8H   sertraline  25 mg Oral Daily   sucralfate  1 g Oral TID WC & HS   tamsulosin  0.4 mg Oral Daily   torsemide  20 mg Oral Daily   Continuous Infusions:  promethazine (PHENERGAN) injection (IM or IVPB) 200 mL/hr at 04/27/22 1016     Anti-infectives (From admission, onward)    Start     Dose/Rate Route Frequency Ordered Stop   04/27/22 1300  amoxicillin-clavulanate (AUGMENTIN) 875-125 MG per tablet 1 tablet        1 tablet Oral Every 12 hours 04/27/22 0750 04/29/22 0959   04/23/22 1430  cefTRIAXone (ROCEPHIN) 1 g in sodium chloride 0.9 % 100 mL IVPB  Status:  Discontinued        1 g 200 mL/hr over 30 Minutes Intravenous Every 24 hours 04/23/22 1332 04/27/22 0750              Family Communication/Anticipated D/C date and plan/Code Status   DVT prophylaxis: apixaban (ELIQUIS) tablet 2.5 mg Start: 04/23/22 1000 apixaban (ELIQUIS) tablet 2.5 mg     Code Status: Full Code  Family Communication: None Disposition Plan: Plan to discharge to SNF   Status is: Inpatient Remains inpatient appropriate because: On IV  antibiotics       Subjective:   Interval events noted.  He complains of vomiting.  No abdominal pain or diarrhea.  He feels dizzy on standing.  Objective:    Vitals:   04/27/22 0754 04/27/22 0936 04/27/22 0941 04/27/22 0946  BP: (!) 120/53 (!) 127/51 (!) 100/38 (!) 103/48  Pulse: 75 76 69 87  Resp: '16 16 16   '$ Temp: 98.5 F (36.9 C)     TempSrc:      SpO2: 95% 97% 94%   Weight:      Height:       Orthostatic VS for the past 24 hrs:  BP- Lying Pulse- Lying BP- Sitting Pulse- Sitting BP- Standing at 0 minutes  Pulse- Standing at 0 minutes  04/27/22 0750 127/51 76 (!) 100/38 69 103/48 87     Intake/Output Summary (Last 24 hours) at 04/27/2022 1050 Last data filed at 04/27/2022 0704 Gross per 24 hour  Intake --  Output 401 ml  Net -401 ml    Filed Weights   04/25/22 0657 04/26/22 0500 04/27/22 0500  Weight: 62.5 kg 62.9 kg 59.8 kg    Exam:  GEN: NAD SKIN: Warm and dry EYES: EOMI ENT: MMM CV: RRR PULM: Coarse breath sounds, bibasilar rales with occasional wheezing ABD: soft, ND, NT, +BS CNS: AAO x 3, non focal EXT: No edema or tenderness         Data Reviewed:   I have personally reviewed following labs and imaging studies:  Labs: Labs show the following:   Basic Metabolic Panel: Recent Labs  Lab 04/21/22 0548 04/22/22 0458 04/23/22 0458 04/24/22 0457 04/25/22 0432 04/26/22 0346 04/27/22 0626  NA 137 136 137  --  138 139 140  K 4.2 3.9 3.8  --  3.5 3.8 3.7  CL 107 103 106  --  106 106 109  CO2 '24 26 26  '$ --  '28 28 26  '$ GLUCOSE 97 69* 98  --  82 117* 104*  BUN 18 23 27*  --  31* 33* 33*  CREATININE 1.48* 1.49* 1.51*  --  1.38* 1.40* 1.39*  CALCIUM 8.6* 8.5* 8.5*  --  8.4* 8.7* 8.7*  MG 1.9 1.8 1.9 1.9  --   --   --   PHOS 3.1  --   --   --   --   --   --    GFR Estimated Creatinine Clearance: 35.9 mL/min (A) (by C-G formula based on SCr of 1.39 mg/dL (H)). Liver Function Tests: Recent Labs  Lab 04/21/22 0548 04/22/22 0458  04/23/22 0458 04/25/22 0432 04/26/22 0346  AST 411* 459* 277* 208* 219*  ALT 527* 604* 499* 322* 309*  ALKPHOS 78 72 72 77 91  BILITOT 1.3* 1.8* 1.5* 1.7* 2.0*  PROT 5.3* 5.2* 5.0* 4.7* 5.0*  ALBUMIN 2.8* 2.8* 2.6* 2.3* 2.3*   No results for input(s): "LIPASE", "AMYLASE" in the last 168 hours. No results for input(s): "AMMONIA" in the last 168 hours. Coagulation profile No results for input(s): "INR", "PROTIME" in the last 168 hours.  CBC: Recent Labs  Lab 04/23/22 1039 04/26/22 0346 04/27/22 0626  WBC 10.3 15.7* 12.5*  NEUTROABS  --   --  11.2*  HGB 9.4* 9.4* 9.0*  HCT 29.0* 28.4* 27.6*  MCV 89.8 89.3 89.6  PLT 144* 190 197   Cardiac Enzymes: No results for input(s): "CKTOTAL", "CKMB", "CKMBINDEX", "TROPONINI" in the last 168 hours. BNP (last 3 results) No results for input(s): "PROBNP" in the last 8760 hours. CBG: Recent Labs  Lab 04/22/22 1615  GLUCAP 92   D-Dimer: No results for input(s): "DDIMER" in the last 72 hours. Hgb A1c: No results for input(s): "HGBA1C" in the last 72 hours. Lipid Profile: No results for input(s): "CHOL", "HDL", "LDLCALC", "TRIG", "CHOLHDL", "LDLDIRECT" in the last 72 hours. Thyroid function studies: No results for input(s): "TSH", "T4TOTAL", "T3FREE", "THYROIDAB" in the last 72 hours.  Invalid input(s): "FREET3" Anemia work up: No results for input(s): "VITAMINB12", "FOLATE", "FERRITIN", "TIBC", "IRON", "RETICCTPCT" in the last 72 hours. Sepsis Labs: Recent Labs  Lab 04/23/22 1039 04/26/22 0346 04/27/22 0626  PROCALCITON 0.50  --   --   WBC 10.3 15.7* 12.5*    Microbiology No results found  for this or any previous visit (from the past 240 hour(s)).   Procedures and diagnostic studies:  No results found.             LOS: 9 days   Donis Pinder  [employer], advertising on [email or web address]. If 7PM-7AM, please contact night-coverage at [email or web address]     04/27/2022, 10:50 AM

## 2022-04-28 ENCOUNTER — Inpatient Hospital Stay: Payer: Medicare Other

## 2022-04-28 DIAGNOSIS — I5022 Chronic systolic (congestive) heart failure: Secondary | ICD-10-CM | POA: Diagnosis not present

## 2022-04-28 DIAGNOSIS — R197 Diarrhea, unspecified: Secondary | ICD-10-CM

## 2022-04-28 DIAGNOSIS — R55 Syncope and collapse: Secondary | ICD-10-CM | POA: Diagnosis not present

## 2022-04-28 DIAGNOSIS — I951 Orthostatic hypotension: Secondary | ICD-10-CM | POA: Diagnosis not present

## 2022-04-28 MED ORDER — BENZONATATE 100 MG PO CAPS
100.0000 mg | ORAL_CAPSULE | Freq: Three times a day (TID) | ORAL | Status: DC | PRN
  Administered 2022-05-02 – 2022-05-08 (×5): 100 mg via ORAL
  Filled 2022-04-28 (×6): qty 1

## 2022-04-28 MED ORDER — ZINC OXIDE 40 % EX OINT
TOPICAL_OINTMENT | CUTANEOUS | Status: DC | PRN
  Filled 2022-04-28: qty 113

## 2022-04-28 MED ORDER — SODIUM CHLORIDE 0.9 % IV SOLN: INTRAVENOUS | Status: DC

## 2022-04-28 NOTE — Progress Notes (Addendum)
Progress Note    Matthew Zamora  [id number]:[id number] DOB: 1940/09/27  DOA: 04/17/2022 PCP: Maryland Pink, MD      Brief Narrative:    Medical records reviewed and are as summarized below:   Matthew Zamora is a 81 y.o. male with medical history significant of hypertension, hyperlipidemia, COPD, APF on Eliquis, CHF with EF of 35-40%, CAD, anemia, CKD-3B, frequent fall, who presented on 04/17/2022 for evaluation of dizziness and lightheadedness, generalized weakness, near syncopal episodes.  This occurred in the setting of about one week of GI illness with nausea and vomiting.  In the ED, afebrile and stable vitals.   Labs notable for K 5.5, no leukocytosis, normal lactic acid x 2, AST 129, ALT 136, Tbili 1.4.  BNP mildly elevated 354.5, troponin x 2 normal, negative for Covid-19 and flu A/B.  Admitted to medicine service for further evaluation and management.    Pt later reported severe constipation at home, with associated lower abdominal pain. Improved after enema and having BM's.  11/2: pt with positive orthostatic vitals with severe symptoms of dizziness, unable to tolerate being upright.  Remains with poor PO intake, started on IV fluids.   11/3--5: ongoing N/V, dizziness. GI and cardiology consulted. IV fluids stopped 11/4 due to dyspnea and cxr with mild vascular congestion.  Eliquis held for EGD.  11/6: EGD showed erosive esophagitis, submucosal duodenal nodule - biopsies report was negative for celiac disease, dysplasia or malignancy.        Assessment/Plan:   Principal Problem:   Near syncope Active Problems:   Dyspnea   NSVT (nonsustained ventricular tachycardia) ([name])   Nausea & vomiting   Hyperkalemia   Abnormal LFTs   Orthostatic hypotension   CKD (chronic kidney disease), stage IIIb   Paroxysmal atrial fibrillation ([name])   Chronic systolic CHF (congestive heart failure) ([name])   Essential hypertension   Hyperlipidemia   CAD (coronary artery  disease)   COPD (chronic obstructive pulmonary disease) ([name])   Normocytic anemia   Postural dizziness with near syncope   Hyponatremia   Transaminitis   Dizziness   Intractable nausea and vomiting   Erosive esophagitis   Multifocal pneumonia   Diarrhea    Body mass index is 20.05 kg/m.   Postural dizziness with near syncope, orthostatic hypotension: According to Neoma Laming, RN, patient could not sit up for orthostatic blood pressure because of dizziness.  Continue midodrine and pyridostigmine. Cortisol level was 25.7.    Multifocal pneumonia: Discontinue IV ceftriaxone (started on 04/23/2022).  Discontinue Augmentin  Acute hypoxic respiratory failure: He is on 1 to 2 L/min oxygen via [place].  Diarrhea: Discontinue MiraLAX and Augmentin.  Start low rate IV normal saline infusion  Erosive esophagitis on EGD on 04/22/2022: Continue Protonix twice daily indefinitely.  Continue Carafate.  Intractable nausea and vomiting: Continue IV Zofran and Phenergan as needed.  EGD showed erosive esophagitis.  Outpatient follow-up with GI.  Acute on chronic systolic and diastolic CHF: Hold torsemide because of diarrhea.  He was given IV Lasix 04/21/2022 for suspected acute exacerbation of CHF.  2D echo on 04/21/2022 showed EF estimated at 30 to 80%, grade 3 diastolic dysfunction  Paroxysmal atrial fibrillation, NSVT: Central telemetry reported brief run of V-fib followed by nonsustained V. tach 04/19/2022.  Patient has AICD in place Amiodarone decreased from 400 to 200 mg daily.  Metoprolol on hold because of hypotension: Continue Eliquis and mexiletine.  Elevated liver enzymes: Liver enzymes are trending down.  Amiodarone dose has been  decreased. Liver ultrasound showed normal gallbladder and CBD.  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  This cyst is stable from previous CT in 2023 in 2021.  AKI on CKD stage IIIb: Creatinine has improved.   Hyponatremia,  hyperkalemia: Resolved   Other comorbidities include CAD, COPD, hypertension,         Diet Order             Diet full liquid Room service appropriate? Yes; Fluid consistency: Thin  Diet effective now                            Consultants: Cardiologist  Procedures: EGD on 04/22/2022    Medications:    amiodarone  200 mg Oral Daily   amoxicillin-clavulanate  1 tablet Oral Q12H   apixaban  2.5 mg Oral BID   ascorbic acid  500 mg Oral Daily   aspirin  81 mg Oral Q1200   feeding supplement  1 Container Oral TID BM   gabapentin  100 mg Oral QHS   magnesium oxide  400 mg Oral Daily   melatonin  10 mg Oral QHS   mexiletine  150 mg Oral BID   midodrine  10 mg Oral TID WC   mometasone-formoterol  2 puff Inhalation BID   multivitamin with minerals  1 tablet Oral Daily   pantoprazole  40 mg Oral BID   pyridostigmine  60 mg Oral Q8H   sertraline  25 mg Oral Daily   sucralfate  1 g Oral TID WC & HS   tamsulosin  0.4 mg Oral Daily   Continuous Infusions:  sodium chloride 50 mL/hr at 04/28/22 1216   promethazine (PHENERGAN) injection (IM or IVPB) 200 mL/hr at 04/27/22 1016     Anti-infectives (From admission, onward)    Start     Dose/Rate Route Frequency Ordered Stop   04/27/22 1300  amoxicillin-clavulanate (AUGMENTIN) 875-125 MG per tablet 1 tablet        1 tablet Oral Every 12 hours 04/27/22 0750 04/29/22 0959   04/23/22 1430  cefTRIAXone (ROCEPHIN) 1 g in sodium chloride 0.9 % 100 mL IVPB  Status:  Discontinued        1 g 200 mL/hr over 30 Minutes Intravenous Every 24 hours 04/23/22 1332 04/27/22 0750              Family Communication/Anticipated D/C date and plan/Code Status   DVT prophylaxis: apixaban (ELIQUIS) tablet 2.5 mg Start: 04/23/22 1000 apixaban (ELIQUIS) tablet 2.5 mg     Code Status: Full Code  Family Communication: None Disposition Plan: Plan to discharge to SNF   Status is: Inpatient Remains inpatient appropriate  because: On IV antibiotics       Subjective:   C/o diarrhea, cough and vomiting.  He said he vomited once this morning.  He has had multiple stools since yesterday.  He still feels dizzy on sitting or standing   Objective:    Vitals:   04/27/22 1740 04/27/22 2011 04/28/22 0445 04/28/22 0937  BP:  (!) 124/54 (!) 128/56 (!) 106/52  Pulse: 77 65 71 69  Resp:  '20 18 17  '$ Temp:  97.6 F (36.4 C) 97.7 F (36.5 C) 97.7 F (36.5 C)  TempSrc:  Oral Oral   SpO2: 94% 98% 96% 97%  Weight:      Height:       Orthostatic VS for the  past 24 hrs:  BP- Lying Pulse- Lying  04/28/22 0937 106/52 69     Intake/Output Summary (Last 24 hours) at 04/28/2022 1217 Last data filed at 04/27/2022 1713 Gross per 24 hour  Intake 50 ml  Output 600 ml  Net -550 ml    Filed Weights   04/25/22 0657 04/26/22 0500 04/27/22 0500  Weight: 62.5 kg 62.9 kg 59.8 kg    Exam:   GEN: NAD SKIN: Warm and dry EYES: EOMI ENT: MMM CV: RRR PULM: mild bibasilar rales, no wheezing ABD: soft, ND, NT, +BS CNS: AAO x 3, non focal EXT: No edema or tenderness         Data Reviewed:   I have personally reviewed following labs and imaging studies:  Labs: Labs show the following:   Basic Metabolic Panel: Recent Labs  Lab 04/22/22 0458 04/23/22 0458 04/24/22 0457 04/25/22 0432 04/26/22 0346 04/27/22 0626  NA 136 137  --  138 139 140  K 3.9 3.8  --  3.5 3.8 3.7  CL 103 106  --  106 106 109  CO2 26 26  --  '28 28 26  '$ GLUCOSE 69* 98  --  82 117* 104*  BUN 23 27*  --  31* 33* 33*  CREATININE 1.49* 1.51*  --  1.38* 1.40* 1.39*  CALCIUM 8.5* 8.5*  --  8.4* 8.7* 8.7*  MG 1.8 1.9 1.9  --   --   --    GFR Estimated Creatinine Clearance: 35.9 mL/min (A) (by C-G formula based on SCr of 1.39 mg/dL (H)). Liver Function Tests: Recent Labs  Lab 04/22/22 0458 04/23/22 0458 04/25/22 0432 04/26/22 0346  AST 459* 277* 208* 219*  ALT 604* 499* 322* 309*  ALKPHOS 72 72 77 91  BILITOT 1.8* 1.5*  1.7* 2.0*  PROT 5.2* 5.0* 4.7* 5.0*  ALBUMIN 2.8* 2.6* 2.3* 2.3*   No results for input(s): "LIPASE", "AMYLASE" in the last 168 hours. No results for input(s): "AMMONIA" in the last 168 hours. Coagulation profile No results for input(s): "INR", "PROTIME" in the last 168 hours.  CBC: Recent Labs  Lab 04/23/22 1039 04/26/22 0346 04/27/22 0626  WBC 10.3 15.7* 12.5*  NEUTROABS  --   --  11.2*  HGB 9.4* 9.4* 9.0*  HCT 29.0* 28.4* 27.6*  MCV 89.8 89.3 89.6  PLT 144* 190 197   Cardiac Enzymes: No results for input(s): "CKTOTAL", "CKMB", "CKMBINDEX", "TROPONINI" in the last 168 hours. BNP (last 3 results) No results for input(s): "PROBNP" in the last 8760 hours. CBG: Recent Labs  Lab 04/22/22 1615  GLUCAP 92   D-Dimer: No results for input(s): "DDIMER" in the last 72 hours. Hgb A1c: No results for input(s): "HGBA1C" in the last 72 hours. Lipid Profile: No results for input(s): "CHOL", "HDL", "LDLCALC", "TRIG", "CHOLHDL", "LDLDIRECT" in the last 72 hours. Thyroid function studies: No results for input(s): "TSH", "T4TOTAL", "T3FREE", "THYROIDAB" in the last 72 hours.  Invalid input(s): "FREET3" Anemia work up: No results for input(s): "VITAMINB12", "FOLATE", "FERRITIN", "TIBC", "IRON", "RETICCTPCT" in the last 72 hours. Sepsis Labs: Recent Labs  Lab 04/23/22 1039 04/26/22 0346 04/27/22 0626  PROCALCITON 0.50  --   --   WBC 10.3 15.7* 12.5*    Microbiology No results found for this or any previous visit (from the past 240 hour(s)).   Procedures and diagnostic studies:  No results found.             LOS: 10 days   [facility]  Pager on [email or web address]. If 7PM-7AM, please contact night-coverage at [email or web address]     04/28/2022, 12:17 PM

## 2022-04-29 DIAGNOSIS — A040 Enteropathogenic Escherichia coli infection: Secondary | ICD-10-CM | POA: Diagnosis not present

## 2022-04-29 DIAGNOSIS — I951 Orthostatic hypotension: Secondary | ICD-10-CM | POA: Diagnosis not present

## 2022-04-29 DIAGNOSIS — J189 Pneumonia, unspecified organism: Secondary | ICD-10-CM | POA: Diagnosis not present

## 2022-04-29 LAB — CBC WITH DIFFERENTIAL/PLATELET
Abs Immature Granulocytes: 0.09 10*3/uL — ABNORMAL HIGH (ref 0.00–0.07)
Basophils Absolute: 0 10*3/uL (ref 0.0–0.1)
Basophils Relative: 0 %
Eosinophils Absolute: 0.2 10*3/uL (ref 0.0–0.5)
Eosinophils Relative: 2 %
HCT: 27.8 % — ABNORMAL LOW (ref 39.0–52.0)
Hemoglobin: 9.1 g/dL — ABNORMAL LOW (ref 13.0–17.0)
Immature Granulocytes: 1 %
Lymphocytes Relative: 5 %
Lymphs Abs: 0.4 10*3/uL — ABNORMAL LOW (ref 0.7–4.0)
MCH: 29.3 pg (ref 26.0–34.0)
MCHC: 32.7 g/dL (ref 30.0–36.0)
MCV: 89.4 fL (ref 80.0–100.0)
Monocytes Absolute: 0.6 10*3/uL (ref 0.1–1.0)
Monocytes Relative: 7 %
Neutro Abs: 6.8 10*3/uL (ref 1.7–7.7)
Neutrophils Relative %: 85 %
Platelets: 236 10*3/uL (ref 150–400)
RBC: 3.11 MIL/uL — ABNORMAL LOW (ref 4.22–5.81)
RDW: 14.5 % (ref 11.5–15.5)
WBC: 8 10*3/uL (ref 4.0–10.5)
nRBC: 0 % (ref 0.0–0.2)

## 2022-04-29 LAB — GASTROINTESTINAL PANEL BY PCR, STOOL (REPLACES STOOL CULTURE)

## 2022-04-29 LAB — PHOSPHORUS: Phosphorus: 3.9 mg/dL (ref 2.5–4.6)

## 2022-04-29 LAB — COMPREHENSIVE METABOLIC PANEL
ALT: 227 U/L — ABNORMAL HIGH (ref 0–44)
AST: 165 U/L — ABNORMAL HIGH (ref 15–41)
Albumin: 2.3 g/dL — ABNORMAL LOW (ref 3.5–5.0)
Alkaline Phosphatase: 156 U/L — ABNORMAL HIGH (ref 38–126)
Anion gap: 6 (ref 5–15)
BUN: 31 mg/dL — ABNORMAL HIGH (ref 8–23)
CO2: 30 mmol/L (ref 22–32)
Calcium: 8.7 mg/dL — ABNORMAL LOW (ref 8.9–10.3)
Chloride: 106 mmol/L (ref 98–111)
Creatinine, Ser: 1.68 mg/dL — ABNORMAL HIGH (ref 0.61–1.24)
GFR, Estimated: 41 mL/min — ABNORMAL LOW (ref >60)
Glucose, Bld: 97 mg/dL (ref 70–99)
Potassium: 3.2 mmol/L — ABNORMAL LOW (ref 3.5–5.1)
Sodium: 142 mmol/L (ref 135–145)
Total Bilirubin: 1.8 mg/dL — ABNORMAL HIGH (ref 0.3–1.2)
Total Protein: 4.9 g/dL — ABNORMAL LOW (ref 6.5–8.1)

## 2022-04-29 LAB — MAGNESIUM: Magnesium: 1.9 mg/dL (ref 1.7–2.4)

## 2022-04-29 MED ORDER — POTASSIUM CHLORIDE CRYS ER 20 MEQ PO TBCR
40.0000 meq | EXTENDED_RELEASE_TABLET | Freq: Once | ORAL | Status: AC
  Administered 2022-04-29: 40 meq via ORAL
  Filled 2022-04-29: qty 2

## 2022-04-29 MED ORDER — CHOLESTYRAMINE 4 G PO PACK
4.0000 g | PACK | Freq: Two times a day (BID) | ORAL | Status: DC
  Administered 2022-04-29 – 2022-04-30 (×4): 4 g via ORAL
  Filled 2022-04-29 (×6): qty 1

## 2022-04-29 MED ORDER — PYRIDOSTIGMINE BROMIDE 60 MG PO TABS
60.0000 mg | ORAL_TABLET | Freq: Three times a day (TID) | ORAL | Status: DC
  Administered 2022-04-29 – 2022-05-13 (×41): 60 mg via ORAL
  Filled 2022-04-29 (×43): qty 1

## 2022-04-29 MED ORDER — PYRIDOSTIGMINE BROMIDE 60 MG PO TABS: 30.0000 mg | ORAL_TABLET | Freq: Three times a day (TID) | ORAL | Status: DC

## 2022-04-29 MED ORDER — RIFAXIMIN 200 MG PO TABS
200.0000 mg | ORAL_TABLET | Freq: Three times a day (TID) | ORAL | Status: AC
  Administered 2022-04-29 – 2022-05-02 (×8): 200 mg via ORAL
  Filled 2022-04-29 (×9): qty 1

## 2022-04-29 MED ORDER — SODIUM CHLORIDE 0.9 % IV SOLN: INTRAVENOUS | Status: AC

## 2022-04-29 NOTE — Progress Notes (Addendum)
Progress Note    Matthew Zamora  STM:[id number] DOB: 27-Jun-1940  DOA: 04/17/2022 PCP: Maryland Pink, MD      Brief Narrative:    Medical records reviewed and are as summarized below:   Matthew Zamora is a 81 y.o. male with medical history significant of hypertension, hyperlipidemia, COPD, APF on Eliquis, CHF with EF of 35-40%, CAD, anemia, CKD-3B, frequent fall, who presented on 04/17/2022 for evaluation of dizziness and lightheadedness, generalized weakness, near syncopal episodes.  This occurred in the setting of about one week of GI illness with nausea and vomiting.  In the ED, afebrile and stable vitals.   Labs notable for K 5.5, no leukocytosis, normal lactic acid x 2, AST 129, ALT 136, Tbili 1.4.  BNP mildly elevated 354.5, troponin x 2 normal, negative for Covid-19 and flu A/B.  Admitted to medicine service for further evaluation and management.    Pt later reported severe constipation at home, with associated lower abdominal pain. Improved after enema and having BM's.  11/2: pt with positive orthostatic vitals with severe symptoms of dizziness, unable to tolerate being upright.  Remains with poor PO intake, started on IV fluids.   11/3--5: ongoing N/V, dizziness. GI and cardiology consulted. IV fluids stopped 11/4 due to dyspnea and cxr with mild vascular congestion.  Eliquis held for EGD.  11/6: EGD showed erosive esophagitis, submucosal duodenal nodule - biopsies report was negative for celiac disease, dysplasia or malignancy.        Assessment/Plan:   Principal Problem:   Near syncope Active Problems:   Dyspnea   NSVT (nonsustained ventricular tachycardia) ([name])   Nausea & vomiting   Hyperkalemia   Abnormal LFTs   Orthostatic hypotension   CKD (chronic kidney disease), stage IIIb   Paroxysmal atrial fibrillation ([name])   Chronic systolic CHF (congestive heart failure) ([name])   Essential hypertension   Hyperlipidemia   CAD (coronary artery  disease)   COPD (chronic obstructive pulmonary disease) ([name])   Normocytic anemia   Postural dizziness with near syncope   Hyponatremia   Transaminitis   Dizziness   Intractable nausea and vomiting   Erosive esophagitis   Multifocal pneumonia   Diarrhea   Enteritis, enteropathogenic E. coli    Body mass index is 19.61 kg/m.   Postural dizziness with near syncope, orthostatic hypotension: He is still orthostatic.  Continue midodrine and pyridostigmine.  IV fluids for hydration. Cortisol level was 25.7.    Multifocal pneumonia: Completed 5 days of IV ceftriaxone 04/27/2022 and completed 2 days of Augmentin 04/28/2022.  Chest x-ray done on the night of 04/28/2022 showed diffuse interstitial and airspace densities with slight progression of densities in the the left mid to upper lung field.  No additional antibiotics for pneumonia  Acute hypoxic respiratory failure: Continue 2 L/min oxygen via [place] and taper off as able  Enteropathogenic E. coli infection, diarrhea: Stool for GI panel was positive for enteropathogenic E. coli 04/29/2022.  He has been started on IV fluids was discontinued last night but this will be restarted because of persistent diarrhea/volume depletion   AKI on CKD stage IIIb: Creatinine is trending upward.  He has been started on IV fluids.  Hypokalemia: Replete potassium and monitor levels  Erosive esophagitis on EGD on 04/22/2022: Continue Protonix twice daily indefinitely.  Continue Carafate.  Intractable nausea and vomiting: Continue IV Zofran and Phenergan as needed.  EGD showed erosive esophagitis.  Outpatient follow-up with GI.  Acute on chronic systolic and diastolic CHF:  Hold torsemide because of diarrhea.  He was given IV Lasix 04/21/2022 for suspected acute exacerbation of CHF.  2D echo on 04/21/2022 showed EF estimated at 30 to 49%, grade 3 diastolic dysfunction  Paroxysmal atrial fibrillation, NSVT: Central telemetry reported brief run of V-fib followed  by nonsustained V. tach 04/19/2022.  Patient has AICD in place Amiodarone decreased from 400 to 200 mg daily.  Metoprolol on hold because of hypotension: Continue Eliquis and mexiletine.  Elevated liver enzymes: Liver enzymes are trending down.  Amiodarone dose has been decreased. Liver ultrasound showed normal gallbladder and CBD.  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  This cyst is stable from previous CT in 2023 in 2021.   Hyponatremia, hyperkalemia: Resolved   Other comorbidities include CAD, COPD, hypertension,         Diet Order             Diet Heart Room service appropriate? Yes; Fluid consistency: Thin  Diet effective now                            Consultants: Cardiologist  Procedures: EGD on 04/22/2022    Medications:    amiodarone  200 mg Oral Daily   apixaban  2.5 mg Oral BID   ascorbic acid  500 mg Oral Daily   aspirin  81 mg Oral Q1200   cholestyramine  4 g Oral BID   feeding supplement  1 Container Oral TID BM   gabapentin  100 mg Oral QHS   magnesium oxide  400 mg Oral Daily   melatonin  10 mg Oral QHS   mexiletine  150 mg Oral BID   midodrine  10 mg Oral TID WC   mometasone-formoterol  2 puff Inhalation BID   multivitamin with minerals  1 tablet Oral Daily   pantoprazole  40 mg Oral BID   pyridostigmine  60 mg Oral Q8H   rifaximin  200 mg Oral TID   sertraline  25 mg Oral Daily   sucralfate  1 g Oral TID WC & HS   tamsulosin  0.4 mg Oral Daily   Continuous Infusions:  sodium chloride 50 mL/hr at 04/29/22 1213   promethazine (PHENERGAN) injection (IM or IVPB) 200 mL/hr at 04/28/22 1603     Anti-infectives (From admission, onward)    Start     Dose/Rate Route Frequency Ordered Stop   04/29/22 1600  rifaximin (XIFAXAN) tablet 200 mg        200 mg Oral 3 times daily 04/29/22 1156 05/02/22 1559   04/27/22 1300  amoxicillin-clavulanate (AUGMENTIN) 875-125 MG per tablet 1 tablet   Status:  Discontinued        1 tablet Oral Every 12 hours 04/27/22 0750 04/28/22 1218   04/23/22 1430  cefTRIAXone (ROCEPHIN) 1 g in sodium chloride 0.9 % 100 mL IVPB  Status:  Discontinued        1 g 200 mL/hr over 30 Minutes Intravenous Every 24 hours 04/23/22 1332 04/27/22 0750              Family Communication/Anticipated D/C date and plan/Code Status   DVT prophylaxis: apixaban (ELIQUIS) tablet 2.5 mg Start: 04/23/22 1000 apixaban (ELIQUIS) tablet 2.5 mg     Code Status: Full Code  Family Communication: Plan discussed with [name], daughter-in-law, over the phone Disposition Plan: Plan to discharge to SNF   Status is: Inpatient Remains inpatient  appropriate because: dizziness, diarrhea       Subjective:   Interval events noted.  He still complains of watery, nonbloody diarrhea.  He still has a cough.  He feels a little dizzy.  No vomiting or abdominal pain.  No chest pain, shortness of breath, fever or chills.    Objective:    Vitals:   04/29/22 1217 04/29/22 1219 04/29/22 1224 04/29/22 1242  BP: (!) 100/51 (!) 89/54 (!) 163/108 (!) 115/53  Pulse: 65 71 80 61  Resp: '18 19 18 16  '$ Temp:    97.9 F (36.6 C)  TempSrc:    Oral  SpO2: 96% 99%    Weight:      Height:       Orthostatic VS for the past 24 hrs:  BP- Lying Pulse- Lying BP- Sitting Pulse- Sitting BP- Standing at 0 minutes Pulse- Standing at 0 minutes  04/29/22 1353 -- -- -- -- (!) 77/42 82  04/29/22 1224 100/51 65 (!) 89/54 71 (!) 163/108 80     Intake/Output Summary (Last 24 hours) at 04/29/2022 1520 Last data filed at 04/29/2022 1500 Gross per 24 hour  Intake 5329.04 ml  Output --  Net 5329.04 ml    Filed Weights   04/26/22 0500 04/27/22 0500 04/29/22 0500  Weight: 62.9 kg 59.8 kg 58.5 kg    Exam:   GEN: NAD SKIN: No rash EYES: EOMI ENT: MMM CV: RRR PULM: Bibasilar rales, no wheezing ABD: soft, ND, NT, +BS CNS: AAO x 3, non focal EXT: No edema or tenderness           Data Reviewed:   I have personally reviewed following labs and imaging studies:  Labs: Labs show the following:   Basic Metabolic Panel: Recent Labs  Lab 04/23/22 0458 04/24/22 0457 04/25/22 0432 04/26/22 0346 04/27/22 0626 04/29/22 0422  NA 137  --  138 139 140 142  K 3.8  --  3.5 3.8 3.7 3.2*  CL 106  --  106 106 109 106  CO2 26  --  '28 28 26 30  '$ GLUCOSE 98  --  82 117* 104* 97  BUN 27*  --  31* 33* 33* 31*  CREATININE 1.51*  --  1.38* 1.40* 1.39* 1.68*  CALCIUM 8.5*  --  8.4* 8.7* 8.7* 8.7*  MG 1.9 1.9  --   --   --  1.9  PHOS  --   --   --   --   --  3.9   GFR Estimated Creatinine Clearance: 29 mL/min (A) (by C-G formula based on SCr of 1.68 mg/dL (H)). Liver Function Tests: Recent Labs  Lab 04/23/22 0458 04/25/22 0432 04/26/22 0346 04/29/22 0422  AST 277* 208* 219* 165*  ALT 499* 322* 309* 227*  ALKPHOS 72 77 91 156*  BILITOT 1.5* 1.7* 2.0* 1.8*  PROT 5.0* 4.7* 5.0* 4.9*  ALBUMIN 2.6* 2.3* 2.3* 2.3*   No results for input(s): "LIPASE", "AMYLASE" in the last 168 hours. No results for input(s): "AMMONIA" in the last 168 hours. Coagulation profile No results for input(s): "INR", "PROTIME" in the last 168 hours.  CBC: Recent Labs  Lab 04/23/22 1039 04/26/22 0346 04/27/22 0626 04/29/22 0422  WBC 10.3 15.7* 12.5* 8.0  NEUTROABS  --   --  11.2* 6.8  HGB 9.4* 9.4* 9.0* 9.1*  HCT 29.0* 28.4* 27.6* 27.8*  MCV 89.8 89.3 89.6 89.4  PLT 144* 190 197 236   Cardiac Enzymes: No results for input(s): "CKTOTAL", "CKMB", "CKMBINDEX", "TROPONINI"  in the last 168 hours. BNP (last 3 results) No results for input(s): "PROBNP" in the last 8760 hours. CBG: Recent Labs  Lab 04/22/22 1615  GLUCAP 92   D-Dimer: No results for input(s): "DDIMER" in the last 72 hours. Hgb A1c: No results for input(s): "HGBA1C" in the last 72 hours. Lipid Profile: No results for input(s): "CHOL", "HDL", "LDLCALC", "TRIG", "CHOLHDL", "LDLDIRECT" in the last 72 hours. Thyroid  function studies: No results for input(s): "TSH", "T4TOTAL", "T3FREE", "THYROIDAB" in the last 72 hours.  Invalid input(s): "FREET3" Anemia work up: No results for input(s): "VITAMINB12", "FOLATE", "FERRITIN", "TIBC", "IRON", "RETICCTPCT" in the last 72 hours. Sepsis Labs: Recent Labs  Lab 04/23/22 1039 04/26/22 0346 04/27/22 0626 04/29/22 0422  PROCALCITON 0.50  --   --   --   WBC 10.3 15.7* 12.5* 8.0    Microbiology Recent Results (from the past 240 hour(s))  Gastrointestinal Panel by PCR , Stool     Status: Abnormal   Collection Time: 04/29/22 10:08 AM   Specimen: Stool  Result Value Ref Range Status   Campylobacter species NOT DETECTED NOT DETECTED Final   Plesimonas shigelloides NOT DETECTED NOT DETECTED Final   Salmonella species NOT DETECTED NOT DETECTED Final   Yersinia enterocolitica NOT DETECTED NOT DETECTED Final   Vibrio species NOT DETECTED NOT DETECTED Final   Vibrio cholerae NOT DETECTED NOT DETECTED Final   Enteroaggregative E coli (EAEC) NOT DETECTED NOT DETECTED Final   Enteropathogenic E coli (EPEC) DETECTED (A) NOT DETECTED Final    Comment: RESULT CALLED TO, READ BACK BY AND VERIFIED WITH: CHRIS BENNETT RN [id number] 04/29/22 HNM    Enterotoxigenic E coli (ETEC) NOT DETECTED NOT DETECTED Final   Shiga like toxin producing E coli (STEC) NOT DETECTED NOT DETECTED Final   Shigella/Enteroinvasive E coli (EIEC) NOT DETECTED NOT DETECTED Final   Cryptosporidium NOT DETECTED NOT DETECTED Final   Cyclospora cayetanensis NOT DETECTED NOT DETECTED Final   Entamoeba histolytica NOT DETECTED NOT DETECTED Final   Giardia lamblia NOT DETECTED NOT DETECTED Final   Adenovirus F40/41 NOT DETECTED NOT DETECTED Final   Astrovirus NOT DETECTED NOT DETECTED Final   Norovirus GI/GII NOT DETECTED NOT DETECTED Final   Rotavirus A NOT DETECTED NOT DETECTED Final   Sapovirus (I, II, IV, and V) NOT DETECTED NOT DETECTED Final    Comment: Performed at [facility], [address] [place], [name] [address]     Procedures and diagnostic studies:  DG Chest 1 View  Result Date: 04/28/2022 CLINICAL DATA:  Cough. EXAM: CHEST  1 VIEW COMPARISON:  Chest radiograph dated 04/23/2022. FINDINGS: There is diffuse interstitial and airspace densities. Slight progression of densities in the left mid to upper lung field. No pleural effusion or pneumothorax. Stable cardiac silhouette. Left pectoral AICD device. Similar appearance of a kinked right ventricular lead. No acute osseous pathology. IMPRESSION: Diffuse interstitial and airspace densities with slight progression of densities in the left mid to upper lung field. Electronically Signed   By: Anner Crete M.D.   On: 04/28/2022 20:27               LOS: 11 days   [facility]   Pager on [email or web address]. If 7PM-7AM, please contact night-coverage at [email or web address]     04/29/2022, 3:20 PM

## 2022-04-29 NOTE — Progress Notes (Signed)
Physical Therapy Treatment Patient Details Name: Matthew Zamora MRN: [id number] DOB: [birth year] Today's Date: 04/29/2022   History of Present Illness Pt is an 81 year old male presenting to ED with lightheadedness; PMH significant for  hypertension, hyperlipidemia, COPD, APF on Eliquis, CHF with EF of 35-40%, CAD, anemia, CKD-3B, and frequent falls.    PT Comments    Pt was pleasant and motivated to participate during the session and put forth good effort throughout. Pt required extra time and effort with bed mobility tasks and min A to come to standing.  Pt continued to stand with heavy lean on the RW with a flexed trunk posture and was unable to advance his LEs.  Pt with diarrhea at end of session with nursing present to assist with hygiene.  Pt continues to present with significant functional weakness and is at a very high risk for falls.  Pt will benefit from PT services in a SNF setting upon discharge to safely address deficits listed in patient problem list for decreased caregiver assistance and eventual return to PLOF.     Recommendations for follow up therapy are one component of a multi-disciplinary discharge planning process, led by the attending physician.  Recommendations may be updated based on patient status, additional functional criteria and insurance authorization.  Follow Up Recommendations  Skilled nursing-short term rehab (<3 hours/day) Can patient physically be transported by private vehicle: No   Assistance Recommended at Discharge Frequent or constant Supervision/Assistance  Patient can return home with the following A little help with walking and/or transfers;A little help with bathing/dressing/bathroom;Assist for transportation;Help with stairs or ramp for entrance;Assistance with cooking/housework   Equipment Recommendations  None recommended by PT    Recommendations for Other Services       Precautions / Restrictions Precautions Precautions:  Fall Precaution Comments: orthostatic Restrictions Weight Bearing Restrictions: No     Mobility  Bed Mobility Overal bed mobility: Modified Independent       Supine to sit: Modified independent (Device/Increase time) Sit to supine: Modified independent (Device/Increase time)   General bed mobility comments: Extra time and effort and use of rails but no physical assistance needed this session with sup to/from sit    Transfers Overall transfer level: Needs assistance Equipment used: Rolling walker (2 wheels) Transfers: Sit to/from Stand Sit to Stand: Min assist, From elevated surface           General transfer comment: Pt able to come to standing with min A from an elevated EOB but was unable to stand upright, stood with pronounced trunk flexion and min knee flexion    Ambulation/Gait               General Gait Details: Unable to advance either LE   Stairs             Wheelchair Mobility    Modified Rankin (Stroke Patients Only)       Balance Overall balance assessment: History of Falls, Needs assistance Sitting-balance support: Feet supported, Bilateral upper extremity supported Sitting balance-Leahy Scale: Fair     Standing balance support: Bilateral upper extremity supported, Reliant on assistive device for balance Standing balance-Leahy Scale: Poor                              Cognition Arousal/Alertness: Awake/alert Behavior During Therapy: WFL for tasks assessed/performed Overall Cognitive Status: Within Functional Limits for tasks assessed  Exercises Total Joint Exercises Bridges: Strengthening, Both, 5 reps Other Exercises Other Exercises: HEP review Other Exercises: Static sitting at EOB for core therex and increased activity tolerance Other Exercises: Rolling L/R for core therex    General Comments        Pertinent Vitals/Pain Pain Assessment Pain  Assessment: No/denies pain    Home Living                          Prior Function            PT Goals (current goals can now be found in the care plan section) Progress towards PT goals: PT to reassess next treatment    Frequency    Min 2X/week      PT Plan Current plan remains appropriate    Co-evaluation              AM-PAC PT "6 Clicks" Mobility   Outcome Measure  Help needed turning from your back to your side while in a flat bed without using bedrails?: A Little Help needed moving from lying on your back to sitting on the side of a flat bed without using bedrails?: A Little Help needed moving to and from a bed to a chair (including a wheelchair)?: A Lot Help needed standing up from a chair using your arms (e.g., wheelchair or bedside chair)?: A Little Help needed to walk in hospital room?: Total Help needed climbing 3-5 steps with a railing? : Total 6 Click Score: 13    End of Session Equipment Utilized During Treatment: Gait belt;Oxygen Activity Tolerance: Patient tolerated treatment well Patient left: in bed;with call bell/phone within reach;with bed alarm set;with nursing/sitter in room Nurse Communication: Mobility status PT Visit Diagnosis: Unsteadiness on feet (R26.81);Muscle weakness (generalized) (M62.81);History of falling (Z91.81);Difficulty in walking, not elsewhere classified (R26.2)     Time: 5638-9373 PT Time Calculation (min) (ACUTE ONLY): 23 min  Charges:  $Therapeutic Exercise: 8-22 mins $Therapeutic Activity: 8-22 mins                     D. Scott Dahlila Pfahler PT, DPT 04/29/22, 4:13 PM

## 2022-04-29 NOTE — Progress Notes (Signed)
Patient is alert and oriented x4. He is bed bound but turns from side to side well. Had 3 bowel movements last night. Collected some stool but charge nurse stated that I should inform the day team for evaluation and could not order a stool panel. Left specimen at bedside and will inform day nurse. Applied destin to buttocks and scrotum due to redness. Has a cough and spits in emesis bag a lot but no nausea/vomiting. Received tylenol for chronic back pain and flank pain. Denied additional needs.

## 2022-04-30 DIAGNOSIS — J189 Pneumonia, unspecified organism: Secondary | ICD-10-CM | POA: Diagnosis not present

## 2022-04-30 DIAGNOSIS — A040 Enteropathogenic Escherichia coli infection: Secondary | ICD-10-CM | POA: Diagnosis not present

## 2022-04-30 DIAGNOSIS — I951 Orthostatic hypotension: Secondary | ICD-10-CM | POA: Diagnosis not present

## 2022-04-30 LAB — BASIC METABOLIC PANEL
Anion gap: 4 — ABNORMAL LOW (ref 5–15)
BUN: 25 mg/dL — ABNORMAL HIGH (ref 8–23)
CO2: 26 mmol/L (ref 22–32)
Calcium: 8.4 mg/dL — ABNORMAL LOW (ref 8.9–10.3)
Chloride: 106 mmol/L (ref 98–111)
Creatinine, Ser: 1.23 mg/dL (ref 0.61–1.24)
GFR, Estimated: 59 mL/min — ABNORMAL LOW (ref >60)
Glucose, Bld: 85 mg/dL (ref 70–99)
Potassium: 3.4 mmol/L — ABNORMAL LOW (ref 3.5–5.1)
Sodium: 136 mmol/L (ref 135–145)

## 2022-04-30 LAB — MAGNESIUM: Magnesium: 1.9 mg/dL (ref 1.7–2.4)

## 2022-04-30 MED ORDER — POTASSIUM CHLORIDE CRYS ER 20 MEQ PO TBCR: 40.0000 meq | EXTENDED_RELEASE_TABLET | Freq: Once | ORAL | Status: DC

## 2022-04-30 MED ORDER — POTASSIUM CHLORIDE CRYS ER 20 MEQ PO TBCR
40.0000 meq | EXTENDED_RELEASE_TABLET | ORAL | Status: AC
  Administered 2022-04-30 (×2): 40 meq via ORAL
  Filled 2022-04-30 (×2): qty 2

## 2022-04-30 NOTE — Progress Notes (Signed)
Progress Note    Matthew Zamora  JGG:[id number] DOB: 23-Jul-1940  DOA: 04/17/2022 PCP: Maryland Pink, MD      Brief Narrative:    Medical records reviewed and are as summarized below:   Matthew Zamora is a 81 y.o. male with medical history significant of hypertension, hyperlipidemia, COPD, APF on Eliquis, CHF with EF of 35-40%, CAD, anemia, CKD-3B, frequent fall, who presented on 04/17/2022 for evaluation of dizziness and lightheadedness, generalized weakness, near syncopal episodes.  This occurred in the setting of about one week of GI illness with nausea and vomiting.  In the ED, afebrile and stable vitals.   Labs notable for K 5.5, no leukocytosis, normal lactic acid x 2, AST 129, ALT 136, Tbili 1.4.  BNP mildly elevated 354.5, troponin x 2 normal, negative for Covid-19 and flu A/B.  Admitted to medicine service for further evaluation and management.    Pt later reported severe constipation at home, with associated lower abdominal pain. Improved after enema and having BM's.  11/2: pt with positive orthostatic vitals with severe symptoms of dizziness, unable to tolerate being upright.  Remains with poor PO intake, started on IV fluids.   11/3--5: ongoing N/V, dizziness. GI and cardiology consulted. IV fluids stopped 11/4 due to dyspnea and cxr with mild vascular congestion.  Eliquis held for EGD.  11/6: EGD showed erosive esophagitis, submucosal duodenal nodule - biopsies report was negative for celiac disease, dysplasia or malignancy.        Assessment/Plan:   Principal Problem:   Near syncope Active Problems:   Dyspnea   NSVT (nonsustained ventricular tachycardia) ([name])   Nausea & vomiting   Hyperkalemia   Abnormal LFTs   Orthostatic hypotension   CKD (chronic kidney disease), stage IIIb   Paroxysmal atrial fibrillation ([name])   Chronic systolic CHF (congestive heart failure) ([name])   Essential hypertension   Hyperlipidemia   CAD (coronary artery  disease)   COPD (chronic obstructive pulmonary disease) ([name])   Normocytic anemia   Postural dizziness with near syncope   Hyponatremia   Transaminitis   Dizziness   Intractable nausea and vomiting   Erosive esophagitis   Multifocal pneumonia   Diarrhea   Enteritis, enteropathogenic E. coli    Body mass index is 20.11 kg/m.   Postural dizziness with near syncope, orthostatic hypotension: He is still orthostatic.  Dizziness is better but he still orthostatic.  Continue midodrine and pyridostigmine. Continue IV fluids for now.   Cortisol level was 25.7.    Multifocal pneumonia: Completed 5 days of IV ceftriaxone 04/27/2022 and completed 2 days of Augmentin 04/28/2022.  Chest x-ray done on the night of 04/28/2022 showed diffuse interstitial and airspace densities with slight progression of densities in the the left mid to upper lung field.  No additional antibiotics for pneumonia  Acute hypoxic respiratory failure: Continue 2 L/min oxygen via [place] and taper off as able  Enteropathogenic E. coli infection, diarrhea: Diarrhea is starting to improve.  Continue Xifaxan.  Continue IV fluids for the rest of the day  Stool for GI panel was positive for enteropathogenic E. coli 04/29/2022.    AKI on CKD stage IIIb: Creatinine is trending downward.  Hypokalemia: Replete potassium and monitor  Erosive esophagitis on EGD on 04/22/2022: Continue Protonix twice daily indefinitely.  Continue Carafate.  Intractable nausea and vomiting: Continue IV Zofran and Phenergan as needed.  EGD showed erosive esophagitis.  Outpatient follow-up with GI.  Acute on chronic systolic and diastolic CHF: Hold  torsemide because of diarrhea.  He was given IV Lasix 04/21/2022 for suspected acute exacerbation of CHF.  2D echo on 04/21/2022 showed EF estimated at 30 to 36%, grade 3 diastolic dysfunction  Paroxysmal atrial fibrillation, NSVT: Central telemetry reported brief run of V-fib followed by nonsustained V. tach  04/19/2022.  Patient has AICD in place Amiodarone decreased from 400 to 200 mg daily.  Metoprolol on hold because of hypotension: Continue Eliquis and mexiletine.  Elevated liver enzymes: Liver enzymes are trending down.  Amiodarone dose has been decreased. Liver ultrasound showed normal gallbladder and CBD.  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  This cyst is stable from previous CT in 2023 in 2021.   Hyponatremia, hyperkalemia: Resolved   Other comorbidities include CAD, COPD, hypertension,         Diet Order             Diet Heart Room service appropriate? Yes; Fluid consistency: Thin  Diet effective now                            Consultants: Cardiologist  Procedures: EGD on 04/22/2022    Medications:    amiodarone  200 mg Oral Daily   apixaban  2.5 mg Oral BID   ascorbic acid  500 mg Oral Daily   aspirin  81 mg Oral Q1200   cholestyramine  4 g Oral BID   feeding supplement  1 Container Oral TID BM   gabapentin  100 mg Oral QHS   magnesium oxide  400 mg Oral Daily   melatonin  10 mg Oral QHS   mexiletine  150 mg Oral BID   midodrine  10 mg Oral TID WC   mometasone-formoterol  2 puff Inhalation BID   multivitamin with minerals  1 tablet Oral Daily   pantoprazole  40 mg Oral BID   pyridostigmine  60 mg Oral Q8H   rifaximin  200 mg Oral TID   sertraline  25 mg Oral Daily   sucralfate  1 g Oral TID WC & HS   tamsulosin  0.4 mg Oral Daily   Continuous Infusions:  sodium chloride 50 mL/hr at 04/30/22 0505   promethazine (PHENERGAN) injection (IM or IVPB) 200 mL/hr at 04/28/22 1603     Anti-infectives (From admission, onward)    Start     Dose/Rate Route Frequency Ordered Stop   04/29/22 1600  rifaximin (XIFAXAN) tablet 200 mg        200 mg Oral 3 times daily 04/29/22 1156 05/02/22 1559   04/27/22 1300  amoxicillin-clavulanate (AUGMENTIN) 875-125 MG per tablet 1 tablet  Status:  Discontinued         1 tablet Oral Every 12 hours 04/27/22 0750 04/28/22 1218   04/23/22 1430  cefTRIAXone (ROCEPHIN) 1 g in sodium chloride 0.9 % 100 mL IVPB  Status:  Discontinued        1 g 200 mL/hr over 30 Minutes Intravenous Every 24 hours 04/23/22 1332 04/27/22 0750              Family Communication/Anticipated D/C date and plan/Code Status   DVT prophylaxis: apixaban (ELIQUIS) tablet 2.5 mg Start: 04/23/22 1000 apixaban (ELIQUIS) tablet 2.5 mg     Code Status: Full Code  Family Communication: Plan of care was discussed with Joycelyn Schmid, daughter-in-law, over the phone  Disposition Plan: Plan to discharge to SNF   Status is:  Inpatient Remains inpatient appropriate because: dizziness, diarrhea       Subjective:   Interval events noted.  He said he feels better.  Diarrhea is improving.  Cough is better.  Dizziness is better.  Objective:    Vitals:   04/29/22 2209 04/30/22 0357 04/30/22 0450 04/30/22 0836  BP: 123/88  (!) 109/58 (!) 126/53  Pulse: 70  70 63  Resp: '16  16 19  '$ Temp: 97.6 F (36.4 C)  98.1 F (36.7 C) 98 F (36.7 C)  TempSrc:      SpO2: 92%  98% 97%  Weight:  60 kg    Height:       Orthostatic VS for the past 24 hrs:  BP- Lying Pulse- Lying BP- Sitting Pulse- Sitting BP- Standing at 0 minutes Pulse- Standing at 0 minutes  04/30/22 1019 119/54 67 102/54 74 (!) 82/41 84  04/29/22 1353 -- -- -- -- (!) 77/42 82  04/29/22 1224 100/51 65 (!) 89/54 71 (!) 163/108 80     Intake/Output Summary (Last 24 hours) at 04/30/2022 1122 Last data filed at 04/29/2022 1839 Gross per 24 hour  Intake 130.73 ml  Output 300 ml  Net -169.27 ml    Filed Weights   04/27/22 0500 04/29/22 0500 04/30/22 0357  Weight: 59.8 kg 58.5 kg 60 kg    Exam:   GEN: NAD SKIN: Warm and dry EYES: EOMI ENT: MMM CV: RRR PULM: Coarse breath sounds bilaterally ABD: soft, ND, NT, +BS CNS: AAO x 3, non focal EXT: No edema or tenderness           Data Reviewed:   I have  personally reviewed following labs and imaging studies:  Labs: Labs show the following:   Basic Metabolic Panel: Recent Labs  Lab 04/24/22 0457 04/25/22 0432 04/25/22 0432 04/26/22 0346 04/27/22 0626 04/29/22 0422 04/30/22 0346  NA  --  138  --  139 140 142 136  K  --  3.5   < > 3.8 3.7 3.2* 3.4*  CL  --  106  --  106 109 106 106  CO2  --  28  --  '28 26 30 26  '$ GLUCOSE  --  82  --  117* 104* 97 85  BUN  --  31*  --  33* 33* 31* 25*  CREATININE  --  1.38*  --  1.40* 1.39* 1.68* 1.23  CALCIUM  --  8.4*  --  8.7* 8.7* 8.7* 8.4*  MG 1.9  --   --   --   --  1.9 1.9  PHOS  --   --   --   --   --  3.9  --    < > = values in this interval not displayed.   GFR Estimated Creatinine Clearance: 40.7 mL/min (by C-G formula based on SCr of 1.23 mg/dL). Liver Function Tests: Recent Labs  Lab 04/25/22 0432 04/26/22 0346 04/29/22 0422  AST 208* 219* 165*  ALT 322* 309* 227*  ALKPHOS 77 91 156*  BILITOT 1.7* 2.0* 1.8*  PROT 4.7* 5.0* 4.9*  ALBUMIN 2.3* 2.3* 2.3*   No results for input(s): "LIPASE", "AMYLASE" in the last 168 hours. No results for input(s): "AMMONIA" in the last 168 hours. Coagulation profile No results for input(s): "INR", "PROTIME" in the last 168 hours.  CBC: Recent Labs  Lab 04/26/22 0346 04/27/22 0626 04/29/22 0422  WBC 15.7* 12.5* 8.0  NEUTROABS  --  11.2* 6.8  HGB 9.4* 9.0* 9.1*  HCT  28.4* 27.6* 27.8*  MCV 89.3 89.6 89.4  PLT 190 197 236   Cardiac Enzymes: No results for input(s): "CKTOTAL", "CKMB", "CKMBINDEX", "TROPONINI" in the last 168 hours. BNP (last 3 results) No results for input(s): "PROBNP" in the last 8760 hours. CBG: No results for input(s): "GLUCAP" in the last 168 hours.  D-Dimer: No results for input(s): "DDIMER" in the last 72 hours. Hgb A1c: No results for input(s): "HGBA1C" in the last 72 hours. Lipid Profile: No results for input(s): "CHOL", "HDL", "LDLCALC", "TRIG", "CHOLHDL", "LDLDIRECT" in the last 72 hours. Thyroid  function studies: No results for input(s): "TSH", "T4TOTAL", "T3FREE", "THYROIDAB" in the last 72 hours.  Invalid input(s): "FREET3" Anemia work up: No results for input(s): "VITAMINB12", "FOLATE", "FERRITIN", "TIBC", "IRON", "RETICCTPCT" in the last 72 hours. Sepsis Labs: Recent Labs  Lab 04/26/22 0346 04/27/22 0626 04/29/22 0422  WBC 15.7* 12.5* 8.0    Microbiology Recent Results (from the past 240 hour(s))  Gastrointestinal Panel by PCR , Stool     Status: Abnormal   Collection Time: 04/29/22 10:08 AM   Specimen: Stool  Result Value Ref Range Status   Campylobacter species NOT DETECTED NOT DETECTED Final   Plesimonas shigelloides NOT DETECTED NOT DETECTED Final   Salmonella species NOT DETECTED NOT DETECTED Final   Yersinia enterocolitica NOT DETECTED NOT DETECTED Final   Vibrio species NOT DETECTED NOT DETECTED Final   Vibrio cholerae NOT DETECTED NOT DETECTED Final   Enteroaggregative E coli (EAEC) NOT DETECTED NOT DETECTED Final   Enteropathogenic E coli (EPEC) DETECTED (A) NOT DETECTED Final    Comment: RESULT CALLED TO, READ BACK BY AND VERIFIED WITH: CHRIS BENNETT RN 1135 04/29/22 HNM    Enterotoxigenic E coli (ETEC) NOT DETECTED NOT DETECTED Final   Shiga like toxin producing E coli (STEC) NOT DETECTED NOT DETECTED Final   Shigella/Enteroinvasive E coli (EIEC) NOT DETECTED NOT DETECTED Final   Cryptosporidium NOT DETECTED NOT DETECTED Final   Cyclospora cayetanensis NOT DETECTED NOT DETECTED Final   Entamoeba histolytica NOT DETECTED NOT DETECTED Final   Giardia lamblia NOT DETECTED NOT DETECTED Final   Adenovirus F40/41 NOT DETECTED NOT DETECTED Final   Astrovirus NOT DETECTED NOT DETECTED Final   Norovirus GI/GII NOT DETECTED NOT DETECTED Final   Rotavirus A NOT DETECTED NOT DETECTED Final   Sapovirus (I, II, IV, and V) NOT DETECTED NOT DETECTED Final    Comment: Performed at [facility], [address] [place], [place] [address]     Procedures  and diagnostic studies:  DG Chest 1 View  Result Date: 04/28/2022 CLINICAL DATA:  Cough. EXAM: CHEST  1 VIEW COMPARISON:  Chest radiograph dated 04/23/2022. FINDINGS: There is diffuse interstitial and airspace densities. Slight progression of densities in the left mid to upper lung field. No pleural effusion or pneumothorax. Stable cardiac silhouette. Left pectoral AICD device. Similar appearance of a kinked right ventricular lead. No acute osseous pathology. IMPRESSION: Diffuse interstitial and airspace densities with slight progression of densities in the left mid to upper lung field. Electronically Signed   By: Anner Crete M.D.   On: 04/28/2022 20:27               LOS: 12 days   [facility]   Pager on [email or web address]. If 7PM-7AM, please contact night-coverage at [email or web address]     04/30/2022, 11:22 AM

## 2022-04-30 NOTE — Progress Notes (Signed)
Physical Therapy Treatment Patient Details Name: Matthew Zamora MRN: [id number] DOB: 10/02/1940 Today's Date: 04/30/2022   History of Present Illness Pt is an 81 year old male presenting to ED with lightheadedness; PMH significant for  hypertension, hyperlipidemia, COPD, APF on Eliquis, CHF with EF of 35-40%, CAD, anemia, CKD-3B, and frequent falls.    PT Comments    Pt is making limited progress towards goals, however is agreeable to sitting in recliner for short time, agreeable to sit up for breakfast. Brought tray into room. Able to SPT to recliner. Orthostatics taken with dizziness noted with transfer. Unable to ambulate at this time. Improved progress. Orthostatic VS for the past 24 hrs:  BP- Lying Pulse- Lying BP- Sitting Pulse- Sitting BP- Standing at 0 minutes Pulse- Standing at 0 minutes  04/30/22 1019 119/54 67 102/54 74 (!) 82/41 84  04/29/22 1353 -- -- -- -- (!) 77/42 82  04/29/22 1224 100/51 65 (!) 89/54 71 (!) 163/108 80      Recommendations for follow up therapy are one component of a multi-disciplinary discharge planning process, led by the attending physician.  Recommendations may be updated based on patient status, additional functional criteria and insurance authorization.  Follow Up Recommendations  Skilled nursing-short term rehab (<3 hours/day) Can patient physically be transported by private vehicle: No   Assistance Recommended at Discharge Intermittent Supervision/Assistance  Patient can return home with the following A little help with walking and/or transfers;A little help with bathing/dressing/bathroom;Assist for transportation;Help with stairs or ramp for entrance;Assistance with cooking/housework   Equipment Recommendations  None recommended by PT    Recommendations for Other Services       Precautions / Restrictions Precautions Precautions: Fall Precaution Comments: orthostatic Restrictions Weight Bearing Restrictions: No     Mobility  Bed  Mobility Overal bed mobility: Modified Independent Bed Mobility: Supine to Sit           General bed mobility comments: safe technique. Extra time required. ONce seated, upright posture noted    Transfers Overall transfer level: Needs assistance Equipment used: Rolling walker (2 wheels) Transfers: Sit to/from Stand Sit to Stand: Min guard Stand pivot transfers: Min assist         General transfer comment: safe technique with RW. Pt complains of dizziness and nausea.    Ambulation/Gait               General Gait Details: unable   Stairs             Wheelchair Mobility    Modified Rankin (Stroke Patients Only)       Balance Overall balance assessment: History of Falls, Needs assistance Sitting-balance support: Feet supported, Bilateral upper extremity supported Sitting balance-Leahy Scale: Fair     Standing balance support: Bilateral upper extremity supported, Reliant on assistive device for balance Standing balance-Leahy Scale: Poor                              Cognition Arousal/Alertness: Awake/alert Behavior During Therapy: WFL for tasks assessed/performed Overall Cognitive Status: Within Functional Limits for tasks assessed                                 General Comments: generalized anxiety regarding OOB mobility        Exercises      General Comments        Pertinent Vitals/Pain Pain Assessment  Pain Assessment: No/denies pain    Home Living                          Prior Function            PT Goals (current goals can now be found in the care plan section) Acute Rehab PT Goals Patient Stated Goal: to return home PT Goal Formulation: With patient Time For Goal Achievement: 05/02/22 Potential to Achieve Goals: Fair Progress towards PT goals: Progressing toward goals    Frequency    Min 2X/week      PT Plan Current plan remains appropriate    Co-evaluation               AM-PAC PT "6 Clicks" Mobility   Outcome Measure  Help needed turning from your back to your side while in a flat bed without using bedrails?: A Little Help needed moving from lying on your back to sitting on the side of a flat bed without using bedrails?: A Little Help needed moving to and from a bed to a chair (including a wheelchair)?: A Little Help needed standing up from a chair using your arms (e.g., wheelchair or bedside chair)?: A Little Help needed to walk in hospital room?: Total Help needed climbing 3-5 steps with a railing? : Total 6 Click Score: 14    End of Session   Activity Tolerance: Patient tolerated treatment well Patient left: in chair;with chair alarm set Nurse Communication: Mobility status PT Visit Diagnosis: Unsteadiness on feet (R26.81);Muscle weakness (generalized) (M62.81);History of falling (Z91.81);Difficulty in walking, not elsewhere classified (R26.2)     Time: 3536-1443 PT Time Calculation (min) (ACUTE ONLY): 23 min  Charges:  $Therapeutic Activity: 23-37 mins                     Greggory Stallion, PT, DPT, GCS [phone]    Deni Lefever 04/30/2022, 11:40 AM

## 2022-04-30 NOTE — Progress Notes (Signed)
OT Cancellation Note  Patient Details Name: COLTYN HANNING MRN: [id number] DOB: [birth year]   Cancelled Treatment:    Reason Eval/Treat Not Completed: Other (comment). Pt with nursing staff for pt care. Will re-attempt OT tx at later date/time as pt is available.   Ardeth Perfect., MPH, MS, OTR/L ascom [phone] 04/30/22, 4:07 PM

## 2022-05-01 DIAGNOSIS — R112 Nausea with vomiting, unspecified: Secondary | ICD-10-CM | POA: Diagnosis not present

## 2022-05-01 DIAGNOSIS — A040 Enteropathogenic Escherichia coli infection: Secondary | ICD-10-CM | POA: Diagnosis not present

## 2022-05-01 DIAGNOSIS — I951 Orthostatic hypotension: Secondary | ICD-10-CM | POA: Diagnosis not present

## 2022-05-01 DIAGNOSIS — I5022 Chronic systolic (congestive) heart failure: Secondary | ICD-10-CM | POA: Diagnosis not present

## 2022-05-01 MED ORDER — POTASSIUM CHLORIDE 2 MEQ/ML IV SOLN
INTRAVENOUS | Status: DC
  Filled 2022-05-01 (×2): qty 1000

## 2022-05-01 MED ORDER — PROCHLORPERAZINE EDISYLATE 10 MG/2ML IJ SOLN
10.0000 mg | Freq: Four times a day (QID) | INTRAMUSCULAR | Status: DC | PRN
  Administered 2022-05-01 (×2): 10 mg via INTRAVENOUS
  Filled 2022-05-01 (×3): qty 2

## 2022-05-01 NOTE — Progress Notes (Signed)
Progress Note   Patient: Matthew Zamora TKZ:[id number] DOB: [birth year] DOA: 04/17/2022     13 DOS: the patient was seen and examined on 05/01/2022   Brief hospital course: Matthew Zamora is a 81 y.o. male with medical history significant of hypertension, hyperlipidemia, COPD, APF on Eliquis, CHF with EF of 35-40%, CAD, anemia, CKD-3B, frequent fall, who presented on 04/17/2022 for evaluation of dizziness and lightheadedness, generalized weakness, near syncopal episodes.  This occurred in the setting of about one week of GI illness with nausea and vomiting.  In the ED, afebrile and stable vitals.   Labs notable for K 5.5, no leukocytosis, normal lactic acid x 2, AST 129, ALT 136, Tbili 1.4.  BNP mildly elevated 354.5, troponin x 2 normal, negative for Covid-19 and flu A/B.  Admitted to medicine service for further evaluation and management.    Pt later reported severe constipation at home, with associated lower abdominal pain. Improved after enema and having BM's.  11/2: pt with positive orthostatic vitals with severe symptoms of dizziness, unable to tolerate being upright.  Remains with poor PO intake, started on IV fluids.   11/3--5: ongoing N/V, dizziness. GI and cardiology consulted. IV fluids stopped 11/4 due to dyspnea and cxr with mild vascular congestion.  Eliquis held for EGD.  11/6: EGD showed erosive esophagitis, submucosal duodenal nodule - biopsies pending.      Assessment and Plan: * Near syncope Most likely due to dehydration in the setting of 1 week nausea vomiting.  Orthostatic vitals positive.  Patient appears dry on exam as well. -- Treated with IV fluids, stopped on morning of 11/4 due to dyspnea and signs of mild volume overload on labs and x-ray --Daily orthostatic vitals --Monitor on telemetry 11/7: last orthostatic vitals still positive but patient reports improved dizziness past two days --Supportive care for GI illness as  outlined   Dyspnea Overnight 11/3-4, pt reported feeling short of breath.  Had been on IV fluids for dehydration, orthostatic hypotension.  CXR obtained, showed borderline vascular congestions. --Stopped IV fluids 11/4 --Given 20 mg IV Lasix x 1 on 11/5 due to ongoing dyspnea and coarse crackles --Monitor closely 11/7: dyspnea has improved, but now found to have PNA.  Completed antibiotics for PNA. --Supplement O2 if sats < 90% or PRN for comfort  Nausea & vomiting Patient reported 1 week history of nausea vomiting at home, leading to dehydration and near syncopal episodes with dizziness lightheadedness. Family report N/V issues frequently for several months, chronic issue. -- IV fluids - stopped 11/4 AM due to dyspnea, likely mild volume overload --IV antiemetics as needed --Monitor renal function and electrolytes --GI consulted, EGD done 11/6 (see erosive esophagitis) --On full liquid diet   NSVT (nonsustained ventricular tachycardia) (Matthew Zamora) Central telemetry reported brief run of V-fib followed by nonsustained VT today (11/3). Patient has AICD in place. --Consult cardiology. --On amiodarone, mexiletine. --There is drug interaction with Amio increasing effects of mexiletine, side effects of mexiletine are similar patient's presenting symptoms. --Amiodarone dose reduced --Appreciate cardiology recommendations. --Monitor on telemetry. --Maintain K above 4, mag above 2  Orthostatic hypotension With PT today, patient was extremely dizzy and lightheaded.  Supine BP 109/80, seated BP 75/50 (repeat 80/63), could not tolerating standing position. --Treated IV fluids --Daily orthostatic vitals --Fall precautions --Started midodrine & dose was up-titrated to 10 mg TID WC 11/15 - still has positive orthostatics but pt reports improved dizziness  Abnormal LFTs On admission AST 129, ALT 136, total bili 1.4. RUQ ultrasound  showed normal gallbladder and CBD, stable complex cyst in the  right lobe of liver.  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   Acute hepatitis panel negative. -- Monitor LFTs daily --GI consulted  AST/ALT improving  Hyperkalemia K was 5.5 on admission, now normalized. Maintain K>4 given hx of dysrhythmias.  CKD (chronic kidney disease), stage IIIb Baseline creatinine 1.7-1.8.  Renal function on admission was at baseline. Creatinine trended up in setting of dehydration with intractable N/V, low BP's.   Cr stable, near baseline --Treated with IV fluids --Daily BMP to monitor --Avoid nephrotoxins, hypotension --Renally dose meds  Chronic systolic CHF (congestive heart failure) ([place]) On admission, hypovolemic and dehydrated. Diuretics on hold.  Was on IV fluids. Still appears overall euvolemic, but increasing dyspnea. 11/5 - CXR with borderline vascular congestion and BNP 764 up from 300's.  May be developing mild decompensation, given IV lasix x 1. --Hold diuretics & use PRN  (appears he takes PRN torsemide at home) --Started on midodrine, continue --Metoprolol on hold --Daily weights --Monitor closely --Cardiology following  Paroxysmal atrial fibrillation ([place]) Rate controlled. --Continue amiodarone, Mexitil --Oral metoprolol now on hold due to hypotension --Eliquis held for EGD, resumed this morning --On telemetry --Lopressor IV PRN for HR sustained above 110  Essential hypertension Hold metoprolol given soft BPs and severe orthostatic symptoms Diuretic on hold IV hydralazine as needed  Now on midodrine  Hyperlipidemia Lipitor on hold due to elevated LFTs  CAD (coronary artery disease) Stable, no active chest pain. Continue ASA, metoprolol. Lipitor on hold due to elevated LFTs.  COPD (chronic obstructive pulmonary disease) ([name]) Stable without wheezing. Continue bronchodilators  Normocytic anemia Hemoglobin stable Monitor CBC  Enteritis, enteropathogenic E. coli See Diarrhea  Diarrhea Stool  studies positive for enteropathogentic E. Coli.    Diarrhea improving. --Continue Xifaxan --Resume gentle maintenance fluids given minimal PO intake  Multifocal pneumonia CXR obtained today due to ongoing productive cough.  It shows bilateral opacities concerning for PNA.  Concern he maybe aspirated during intractable N/V and dry heaving.   --Completed course of Rocephin --Pulmonary hygiene --Mucinex and other supportive care PRN --Supplement O2 if needed to keep spO2>90%  Erosive esophagitis EGD on 11/6 notable for LA grade D esophagitis with no bleeding seen. --Follow biopsy path results --Continue PPI BID indefinitely --Started on Carafate --Follow up in GI clinic  Intractable nausea and vomiting Chronic x several months. See Nausea & Vomiting, Erosive esophagitis EGD on 11/5 as above Follow up with GI as outpatient.  Dizziness See Near Syncope  Transaminitis See abnormal LFTs  Hyponatremia Likely hypovolemic in setting of N/V and dehydration. Resolved.  Postural dizziness with near syncope Most likely due to orthostatic hypotension. Given nausea vomiting, vertigo is in the differential but feel is less likely.  Pt denies room spinning.        Subjective: Patient awake sitting up in bed, holding emesis bag when seen on rounds today.  He reports ongoing productive cough.  Also reports persistent nausea with very little improvement with Zofran, requesting alternate nausea medication.  He reports dizziness is improved.  Bedside RN later this morning reported patient continually pulling off his nasal cannula oxygen.  When she asked why he was doing this, he stated he was "looking for a way to die".  Palliative consult pending for goals of care discussion.  Physical Exam: Vitals:   04/30/22 0836 04/30/22 1615 04/30/22 2125 05/01/22 0549  BP: (!) 126/53 116/61 (!) 135/55 135/63  Pulse: 63 82 67  77  Resp: '19 18 20 18  '$ Temp: 98 F (36.7 C) 97.6 F (36.4 C) 97.8 F  (36.6 C) 98.1 F (36.7 C)  TempSrc:   Oral   SpO2: 97% 93% 96% 96%  Weight:      Height:       General exam: awake, alert, no acute distress, underweight HEENT: Temporal wasting, moist mucus membranes, hearing grossly normal  Respiratory system: Coarse upper airway secretion sounds, no wheezes, normal respiratory effort. Cardiovascular system: normal S1/S2, RR, no pedal edema.   Gastrointestinal system: soft, NT, sunken abdomen Central nervous system: A&O x3. no gross focal neurologic deficits, normal speech Extremities: moves all, no edema, normal tone Skin: dry, intact, normal temperature, normal color Psychiatry: normal mood, congruent affect, judgement and insight appear normal   Data Reviewed:  Notable labs: Potassium 3.4, BUN 25, creatinine improved 1.23 down from 1.68,  Family Communication: None at bedside on rounds, will attempt to call  Disposition: Status is: Inpatient Remains inpatient appropriate because: Ongoing nausea vomiting requiring IV antiemetics, and adequate p.o. intake secondary to same.  Needs SNF/rehab placement but not yet medically stable for discharge.   Planned Discharge Destination: Skilled nursing facility    Time spent: 40 minutes  Author: Ezekiel Slocumb, DO 05/01/2022 12:43 PM  For on call review [email or web address].

## 2022-05-01 NOTE — Evaluation (Signed)
Occupational Therapy Re-Evaluation Patient Details Name: Matthew Zamora MRN: [id number] DOB: [birth year] Today's Date: 05/01/2022   History of Present Illness Pt is an 81 year old male presenting to ED with lightheadedness; PMH significant for  hypertension, hyperlipidemia, COPD, APF on Eliquis, CHF with EF of 35-40%, CAD, anemia, CKD-3B, frequent fall   Clinical Impression   Chart reviewed, pt greeted in bed with mobility tech present requiring significant encouragement in participation in therapy. Re-evaluation completed on this date, pt has made slow progress towards goals, is limited by continued symptomatic orthostatics during standing. Discharge recommendation remains appropriate.      Recommendations for follow up therapy are one component of a multi-disciplinary discharge planning process, led by the attending physician.  Recommendations may be updated based on patient status, additional functional criteria and insurance authorization.   Follow Up Recommendations  Skilled nursing-short term rehab (<3 hours/day)     Assistance Recommended at Discharge Frequent or constant Supervision/Assistance  Patient can return home with the following A lot of help with walking and/or transfers;A lot of help with bathing/dressing/bathroom    Functional Status Assessment  Patient has had a recent decline in their functional status and demonstrates the ability to make significant improvements in function in a reasonable and predictable amount of time.  Equipment Recommendations  Other (comment) (defer)    Recommendations for Other Services       Precautions / Restrictions Precautions Precautions: Fall Precaution Comments: orthostatic Restrictions Weight Bearing Restrictions: No      Mobility Bed Mobility Overal bed mobility: Modified Independent                  Transfers Overall transfer level: Needs assistance Equipment used: Rolling walker (2 wheels) Transfers: Sit  to/from Stand Sit to Stand: Min guard                  Balance Overall balance assessment: History of Falls, Needs assistance Sitting-balance support: Feet supported, Bilateral upper extremity supported Sitting balance-Leahy Scale: Fair     Standing balance support: Bilateral upper extremity supported, Reliant on assistive device for balance Standing balance-Leahy Scale: Poor                             ADL either performed or assessed with clinical judgement   ADL Overall ADL's : Needs assistance/impaired Eating/Feeding: Set up;Sitting;Bed level   Grooming: Wash/dry face;Sitting;Set up               Lower Body Dressing: Maximal assistance   Toilet Transfer: Min guard;Rolling walker (2 wheels) Toilet Transfer Details (indicate cue type and reason): simulated           General ADL Comments: lateral steps up the bed with CGA     Vision         Perception     Praxis      Pertinent Vitals/Pain       Hand Dominance     Extremity/Trunk Assessment Upper Extremity Assessment Upper Extremity Assessment: Generalized weakness   Lower Extremity Assessment Lower Extremity Assessment: Generalized weakness       Communication     Cognition Arousal/Alertness: Awake/alert Behavior During Therapy: Anxious Overall Cognitive Status: No family/caregiver present to determine baseline cognitive functioning Area of Impairment: Safety/judgement, Problem solving                         Safety/Judgement: Decreased awareness of deficits  Problem Solving: Slow processing, Requires verbal cues, Requires tactile cues       General Comments  pt continues to present with symptomatic orthostatics    Exercises     Shoulder Instructions      Home Living                                          Prior Functioning/Environment                          OT Problem List: Decreased strength;Decreased activity  tolerance;Impaired balance (sitting and/or standing);Decreased knowledge of use of DME or AE      OT Treatment/Interventions: Self-care/ADL training;Patient/family education;Therapeutic exercise;Balance training;Energy conservation;Therapeutic activities;DME and/or AE instruction    OT Goals(Current goals can be found in the care plan section) Acute Rehab OT Goals Patient Stated Goal: feel better OT Goal Formulation: With patient Time For Goal Achievement: 05/15/22 Potential to Achieve Goals: Good ADL Goals Pt Will Transfer to Toilet: with supervision  OT Frequency: Min 2X/week    Co-evaluation              AM-PAC OT "6 Clicks" Daily Activity     Outcome Measure Help from another person eating meals?: None Help from another person taking care of personal grooming?: A Little Help from another person toileting, which includes using toliet, bedpan, or urinal?: A Lot Help from another person bathing (including washing, rinsing, drying)?: A Lot Help from another person to put on and taking off regular upper body clothing?: A Little Help from another person to put on and taking off regular lower body clothing?: A Lot 6 Click Score: 16   End of Session Equipment Utilized During Treatment: Oxygen;Rolling walker (2 wheels) Nurse Communication: Mobility status (orthostatics)  Activity Tolerance: Patient limited by fatigue;Treatment limited secondary to medical complications (Comment) Patient left: in bed;with call bell/phone within reach;with bed alarm set  OT Visit Diagnosis: Unsteadiness on feet (R26.81);Muscle weakness (generalized) (M62.81);Repeated falls (R29.6)                Time: 5284-1324 OT Time Calculation (min): 19 min Charges:  OT General Charges $OT Visit: 1 Visit OT Evaluation $OT Re-eval: 1 Re-eval OT Treatments $Self Care/Home Management : 8-22 mins  Shanon Payor, OTD OTR/L  05/01/22, 1:26 PM

## 2022-05-01 NOTE — Assessment & Plan Note (Signed)
See Diarrhea

## 2022-05-01 NOTE — TOC Progression Note (Signed)
Transition of Care [facility]) - Progression Note    Patient Details  Name: Matthew Zamora MRN: [id number] Date of Birth: [birth year]  Transition of Care [facility]) CM/SW [place], [place] Phone Number: 05/01/2022, 4:57 PM  Clinical Narrative:     Donella Stade is working on Ship broker for this patient. He would like to attend [employer]. He has [employer]. PT note completed for authorization.  Expected Discharge Plan: [place] Barriers to Discharge: Continued Medical Work up  Expected Discharge Plan and Services Expected Discharge Plan: [place]   Discharge Planning Services: CM Consult                                           Social Determinants of Health (SDOH) Interventions    Readmission Risk Interventions    04/22/2022    3:54 PM  Readmission Risk Prevention Plan  Transportation Screening Complete  Medication Review ([place]) Complete  PCP or Specialist appointment within 3-5 days of discharge Complete  HRI or [place] Complete  SW Recovery Care/Counseling Consult Complete  [place] Not Applicable

## 2022-05-01 NOTE — Plan of Care (Signed)
GOC consult noted. Patient is resting in bed with eyes closed. He does not awaken to voice. No family at bedside. Will reattempt at a different time.

## 2022-05-01 NOTE — Assessment & Plan Note (Addendum)
Stool studies positive for enteropathogentic E. Coli.  Treated with Xifaxan.  Diarrhea resolved.

## 2022-05-02 ENCOUNTER — Inpatient Hospital Stay: Payer: Medicare Other

## 2022-05-02 DIAGNOSIS — R55 Syncope and collapse: Secondary | ICD-10-CM | POA: Diagnosis not present

## 2022-05-02 DIAGNOSIS — K2210 Ulcer of esophagus without bleeding: Secondary | ICD-10-CM | POA: Diagnosis not present

## 2022-05-02 DIAGNOSIS — R531 Weakness: Secondary | ICD-10-CM | POA: Diagnosis not present

## 2022-05-02 DIAGNOSIS — R112 Nausea with vomiting, unspecified: Secondary | ICD-10-CM | POA: Diagnosis not present

## 2022-05-02 LAB — BASIC METABOLIC PANEL
Anion gap: 4 — ABNORMAL LOW (ref 5–15)
BUN: 16 mg/dL (ref 8–23)
CO2: 25 mmol/L (ref 22–32)
Calcium: 8.6 mg/dL — ABNORMAL LOW (ref 8.9–10.3)
Chloride: 107 mmol/L (ref 98–111)
Creatinine, Ser: 1.26 mg/dL — ABNORMAL HIGH (ref 0.61–1.24)
GFR, Estimated: 58 mL/min — ABNORMAL LOW (ref >60)
Glucose, Bld: 87 mg/dL (ref 70–99)
Potassium: 4.6 mmol/L (ref 3.5–5.1)
Sodium: 136 mmol/L (ref 135–145)

## 2022-05-02 LAB — BRAIN NATRIURETIC PEPTIDE: B Natriuretic Peptide: 591.1 pg/mL — ABNORMAL HIGH (ref 0.0–100.0)

## 2022-05-02 LAB — CBC
HCT: 26.5 % — ABNORMAL LOW (ref 39.0–52.0)
Hemoglobin: 8.6 g/dL — ABNORMAL LOW (ref 13.0–17.0)
MCH: 29.3 pg (ref 26.0–34.0)
MCHC: 32.5 g/dL (ref 30.0–36.0)
MCV: 90.1 fL (ref 80.0–100.0)
Platelets: 309 10*3/uL (ref 150–400)
RBC: 2.94 MIL/uL — ABNORMAL LOW (ref 4.22–5.81)
RDW: 14.5 % (ref 11.5–15.5)
WBC: 9.5 10*3/uL (ref 4.0–10.5)
nRBC: 0 % (ref 0.0–0.2)

## 2022-05-02 LAB — PROCALCITONIN: Procalcitonin: 0.24 ng/mL

## 2022-05-02 LAB — MAGNESIUM: Magnesium: 2 mg/dL (ref 1.7–2.4)

## 2022-05-02 MED ORDER — DM-GUAIFENESIN ER 30-600 MG PO TB12
1.0000 | ORAL_TABLET | Freq: Two times a day (BID) | ORAL | Status: DC
  Administered 2022-05-02 – 2022-05-13 (×23): 1 via ORAL
  Filled 2022-05-02 (×23): qty 1

## 2022-05-02 MED ORDER — FUROSEMIDE 10 MG/ML IJ SOLN
20.0000 mg | Freq: Once | INTRAMUSCULAR | Status: AC
  Administered 2022-05-02: 20 mg via INTRAVENOUS
  Filled 2022-05-02: qty 2

## 2022-05-02 NOTE — Assessment & Plan Note (Addendum)
Due to recurrent N/V x several months, unable to get adequate calories / nutrition. --PT/OT recommending SNF. --TOC following for placement, patient had insurance Auth but due to his ongoing symptomatic orthostatic hypotension I do not think he is stable for discharge medically --Fall precautions.

## 2022-05-02 NOTE — Progress Notes (Signed)
       CROSS COVER NOTE  NAME: ZAKHAI MEISINGER MRN: [id number] DOB : Jan 20, 1941 ATTENDING PHYSICIAN: Ezekiel Slocumb, DO    Date of Service   05/02/2022   HPI/Events of Note   This morning Mr Cabello reported dyspnea at rest, RN noted him to have increased work of breathing and he was hypoxic, SPO2 88% on 2L, requiring increase in O2 to 4L. He was given PRN albuterol.  On my assessment Mr Spease reports his breathing is better post-albuterol. He no longer has accessory muscle use. Rhonchi noted on auscultation that cleared with coughing.  Mr Toner had a similar event 11/4 AM where IVF were held. He remained dyspneic requiring IV lasix the following day.  Interventions   Assessment/Plan:  Hold IVF CXR--> stable multifocal pulmonary infiltrate     This document was prepared using Dragon voice recognition software and may include unintentional dictation errors.  Neomia Glass DNP, MBA, FNP-BC Nurse Practitioner [facility] Pager [phone]

## 2022-05-02 NOTE — Progress Notes (Signed)
Physical Therapy Treatment Patient Details Name: Matthew Zamora MRN: [id number] DOB: 13-Dec-1940 Today's Date: 05/02/2022   History of Present Illness Pt is an 81 year old male presenting to ED with lightheadedness; PMH significant for  hypertension, hyperlipidemia, COPD, APF on Eliquis, CHF with EF of 35-40%, CAD, anemia, CKD-3B, frequent fall    PT Comments    Pt scheduled for re-evaluation this date. Pt status hasn't changed since last evaluation. Goals will be extended at this time. Pt agreeable to transfer to chair for lunch, however declines to walk this date. Reports nausea and dizziness improved this session and required less physical assist for mobility efforts. Will continue to progress as able.  Recommendations for follow up therapy are one component of a multi-disciplinary discharge planning process, led by the attending physician.  Recommendations may be updated based on patient status, additional functional criteria and insurance authorization.  Follow Up Recommendations  Skilled nursing-short term rehab (<3 hours/day) Can patient physically be transported by private vehicle: No   Assistance Recommended at Discharge Intermittent Supervision/Assistance  Patient can return home with the following A little help with walking and/or transfers;A little help with bathing/dressing/bathroom;Assist for transportation;Help with stairs or ramp for entrance;Assistance with cooking/housework   Equipment Recommendations  None recommended by PT    Recommendations for Other Services       Precautions / Restrictions Precautions Precautions: Fall Restrictions Weight Bearing Restrictions: No     Mobility  Bed Mobility Overal bed mobility: Modified Independent Bed Mobility: Supine to Sit     Supine to sit: Modified independent (Device/Increase time)     General bed mobility comments: improved ability to perform and no dizziness noted.    Transfers Overall transfer level:  Needs assistance Equipment used: Rolling walker (2 wheels) Transfers: Bed to chair/wheelchair/BSC   Stand pivot transfers: Min assist         General transfer comment: follows commands well. Declines to attempt to ambulate to chair and asks to have it flush with the bed for transfer.    Ambulation/Gait               General Gait Details: not performed   Stairs             Wheelchair Mobility    Modified Rankin (Stroke Patients Only)       Balance Overall balance assessment: History of Falls, Needs assistance Sitting-balance support: Feet supported, Bilateral upper extremity supported Sitting balance-Leahy Scale: Fair     Standing balance support: Bilateral upper extremity supported, Reliant on assistive device for balance Standing balance-Leahy Scale: Fair                              Cognition Arousal/Alertness: Awake/alert Behavior During Therapy: WFL for tasks assessed/performed Overall Cognitive Status: Within Functional Limits for tasks assessed                                 General Comments: very particular        Exercises      General Comments        Pertinent Vitals/Pain Pain Assessment Pain Assessment: No/denies pain    Home Living                          Prior Function            PT  Goals (current goals can now be found in the care plan section) Acute Rehab PT Goals Patient Stated Goal: to return home PT Goal Formulation: With patient Time For Goal Achievement: 05/16/22 Potential to Achieve Goals: Fair Progress towards PT goals: Progressing toward goals    Frequency    Min 2X/week      PT Plan Current plan remains appropriate    Co-evaluation              AM-PAC PT "6 Clicks" Mobility   Outcome Measure  Help needed turning from your back to your side while in a flat bed without using bedrails?: None Help needed moving from lying on your back to sitting on the  side of a flat bed without using bedrails?: None Help needed moving to and from a bed to a chair (including a wheelchair)?: A Little Help needed standing up from a chair using your arms (e.g., wheelchair or bedside chair)?: A Little Help needed to walk in hospital room?: A Little Help needed climbing 3-5 steps with a railing? : A Little 6 Click Score: 20    End of Session   Activity Tolerance: Patient tolerated treatment well Patient left: in chair;with chair alarm set Nurse Communication: Mobility status PT Visit Diagnosis: Unsteadiness on feet (R26.81);Muscle weakness (generalized) (M62.81);History of falling (Z91.81);Difficulty in walking, not elsewhere classified (R26.2)     Time: 9417-4081 PT Time Calculation (min) (ACUTE ONLY): 17 min  Charges:  $Therapeutic Activity: 8-22 mins                     Greggory Stallion, PT, DPT, GCS [phone]    Matthew Zamora 05/02/2022, 4:32 PM

## 2022-05-02 NOTE — Consult Note (Addendum)
Consultation Note Date: 05/02/2022   Patient Name: Matthew Zamora  DOB: [birth year]  MRN: [id number]  Age / Sex: 81 y.o., male  PCP: [place] Pink, MD Referring Physician: Ezekiel Slocumb, DO  Reason for Consultation: Establishing goals of care  HPI/Patient Profile: Matthew Zamora is a 81 y.o. male with medical history significant of hypertension, hyperlipidemia, COPD, APF on Eliquis, CHF with EF of 35-40%, CAD, anemia, CKD-3B, frequent fall, who presented on 04/17/2022 for evaluation of dizziness and lightheadedness, generalized weakness, near syncopal episodes.  This occurred in the setting of about one week of GI illness with nausea and vomiting.   Clinical Assessment and Goals of Care: Patient is sitting in bed.  He states that he lives at home alone.  He has 3 children.  He states 1 lives next door and is able to help him if needed.  He states his middle son is married to a home health nurse who helps him with his medications, groceries and food, and getting him back and forth to doctors appointments.  He states he has a third son as well.  He tells me that he would want his middle son to be his surrogate decision maker if he is unable to make decisions for himself.  He states at home he has the assistance of his daughter-in-law, and there is someone who comes in a few times a week to clean.  He states he has a walker in his house which he uses, as well as a walker on his porch, and a walker in the yard.  He is able to complete ADLs.  In discussing his wishes on care moving forward, he discusses his nausea and vomiting and how horrible this has been for him.  He states his nausea/vomiting has improved over the past day.  He discusses his weight loss and states that the majority of his weight loss has been since his admission here, because he did not realize that he could order his food and did not  like what was being brought to him.    He states that having been on a ventilator in the past, he would be amenable to this if it were needed.  He states he would not want a tracheostomy.  He states he would not want to have to live in a nursing facility.  He states any other care that is necessary at this time he would be amenable to. We discussed CODE STATUS.  He tells me that he has a Investment banker, corporate which has fired in the past over 60 times.  He states the defibrillator kept firing and as this occurred he had to crawl to the door to unlock it for help to come in.  He states they had to make changes to the settings.  He states he would like to continue his defibrillator, but is unsure if he would want subsequent external defibrillation or CPR.  He states he will need to speak with his daughter-in-law regarding this, and request that I call her as  well.  Patient is aware that H POA papers and living will can be completed while here in the hospital.  Spoke with his daughter-in-law.  She confirms that she is a Emergency planning/management officer.  She states that patient does have a son that lives next door to him that they have a very hot and cold relationship.    We discussed her father-in-law's health.  She confirms that she goes with him to all of his doctors appointments and helps with decision-making.  She discusses that he was doing well until he was placed on spironolactone.  She discusses his significant transaminitis as well as his dehydration from the medication.  She states he has not been the same since being placed on that medication.  She discusses his defibrillator and that it malfunctioned and shocked him a total of 64 times.  She states that he has always wanted to do the things that he could to live as long as he could.  She states she will speak with him regarding H POA documents and CODE STATUS.  SUMMARY OF RECOMMENDATIONS   Patient states he would like to continue to treat the treatable with  full code.  He states that if he is put on a ventilator in the future he would not want to have a tracheostomy.  He states that he would want to continue his defibrillator but is unsure of his feelings on external defibrillation or CPR.  He states he will need to discuss this with his daughter-in-law.  Daughter-in-law states she will speak with him about this in the coming days.  Recommend continuing PRN Compazine, and discharging him home with oral PRN Compazine.  Per conversation, Zofran has not worked for him.        Primary Diagnoses: Present on Admission:  Near syncope  Paroxysmal atrial fibrillation ([name])  Essential hypertension  Hyperlipidemia  CAD (coronary artery disease)  CKD (chronic kidney disease), stage IIIb  COPD (chronic obstructive pulmonary disease) ([name])  Hyperkalemia  Abnormal LFTs  Chronic systolic CHF (congestive heart failure) ([name])  Normocytic anemia  Nausea & vomiting  Orthostatic hypotension  NSVT (nonsustained ventricular tachycardia) ([name])  Hyponatremia  Transaminitis  Intractable nausea and vomiting  Erosive esophagitis   I have reviewed the medical record, interviewed the patient and family, and examined the patient. The following aspects are pertinent.  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 pylori infection    History of hepatitis    Hyperlipidemia    Hypertension    Liver cyst 05/16/15   PAF (paroxysmal atrial fibrillation) ([name])    Prostate CA ([place])    Social History   Socioeconomic History   Marital status: Single    Spouse name: Not on file   Number of children: Not on file   Years of  education: Not on file   Highest education level: Not on file  Occupational History   Not on file  Tobacco Use   Smoking status: Former    Packs/day: 1.00    Years: 38.00    Total pack years: 38.00    Types: Cigarettes    Quit  date: 06/18/1999    Years since quitting: 22.8   Smokeless tobacco: Never  Vaping Use   Vaping Use: Never used  Substance and Sexual Activity   Alcohol use: No    Alcohol/week: 0.0 standard drinks of alcohol   Drug use: Not Currently   Sexual activity: Not Currently  Other Topics Concern   Not on file  Social History Narrative   Not on file   Social Determinants of Health   Financial Resource Strain: Not on file  Food Insecurity: No Food Insecurity (04/17/2022)   Hunger Vital Sign    Worried About Running Out of Food in the Last Year: Never true    Ran Out of Food in the Last Year: Never true  Transportation Needs: No Transportation Needs (04/17/2022)   PRAPARE - Hydrologist (Medical): No    Lack of Transportation (Non-Medical): No  Physical Activity: Not on file  Stress: Not on file  Social Connections: Not on file   Family History  Problem Relation Age of Onset   Heart attack Mother    Heart attack Father    Scheduled Meds:  amiodarone  200 mg Oral Daily   apixaban  2.5 mg Oral BID   ascorbic acid  500 mg Oral Daily   aspirin  81 mg Oral Q1200   dextromethorphan-guaiFENesin  1 tablet Oral BID   feeding supplement  1 Container Oral TID BM   gabapentin  100 mg Oral QHS   magnesium oxide  400 mg Oral Daily   melatonin  10 mg Oral QHS   mexiletine  150 mg Oral BID   midodrine  10 mg Oral TID WC   mometasone-formoterol  2 puff Inhalation BID   multivitamin with minerals  1 tablet Oral Daily   pantoprazole  40 mg Oral BID   pyridostigmine  60 mg Oral Q8H   rifaximin  200 mg Oral TID   sertraline  25 mg Oral Daily   sucralfate  1 g Oral TID WC & HS   tamsulosin  0.4 mg Oral Daily   Continuous Infusions: PRN  Meds:.acetaminophen, albuterol, benzonatate, calcium carbonate, hydrALAZINE, HYDROcodone bit-homatropine, liver oil-zinc oxide, metoprolol tartrate, ondansetron (ZOFRAN) IV, polyvinyl alcohol, prochlorperazine Medications Prior to Admission:  Prior to Admission medications   Medication Sig Start Date End Date Taking? Authorizing Provider  albuterol (VENTOLIN HFA) 108 (90 Base) MCG/ACT inhaler Inhale 2 puffs into the lungs every 4 (four) hours as needed. 04/21/21  Yes [provider]  amiodarone (PACERONE) 400 MG tablet Take 1 tablet (400 mg total) by mouth daily. 12/16/19  Yes Pokhrel, Laxman, MD  ascorbic acid (VITAMIN C) 500 MG tablet Take 1 tablet by mouth daily.   Yes [provider]  aspirin 81 MG chewable tablet Chew 81 mg by mouth daily at 12 noon. 03/30/21  Yes [provider]  atorvastatin (LIPITOR) 80 MG tablet Take 80 mg by mouth daily. 05/13/21  Yes [provider]  ELIQUIS 2.5 MG TABS tablet Take 2.5 mg by mouth 2 (two) times daily. 12/28/21  Yes [provider]  fluticasone-salmeterol (ADVAIR) 500-50 MCG/ACT AEPB Inhale 1 puff into the lungs in the morning and at bedtime. 04/05/22  Yes [provider]  gabapentin (NEURONTIN) 100 MG capsule Take 100 mg by mouth at bedtime. 12/09/19  Yes [provider]  magnesium oxide (MAG-OX) 400 (240 Mg) MG tablet Take 1 tablet by mouth daily. 02/05/21  Yes [provider]  melatonin 3 MG TABS  tablet Take 9 mg by mouth at bedtime.   Yes [provider]  metoprolol succinate (TOPROL-XL) 25 MG 24 hr tablet Take 1 tablet (25 mg total) by mouth 2 (two) times daily. Take with or immediately following a meal. 02/12/22  Yes Emeterio Reeve, DO  mexiletine (MEXITIL) 150 MG capsule Take 150 mg by mouth 2 (two) times daily. 12/01/19  Yes [provider]  Multiple Vitamin (MULTIVITAMIN WITH MINERALS) TABS tablet Take 1 tablet by mouth daily. 10/29/19  Yes Darel Hong D, NP   omeprazole (PRILOSEC) 20 MG capsule Take 20 mg by mouth every morning. 05/18/21  Yes [provider]  ondansetron (ZOFRAN-ODT) 4 MG disintegrating tablet Take 4 mg by mouth every 8 (eight) hours as needed for refractory nausea / vomiting, vomiting or nausea.   Yes [provider]  sertraline (ZOLOFT) 25 MG tablet Take 25 mg by mouth daily.   Yes [provider]  tamsulosin (FLOMAX) 0.4 MG CAPS capsule Take 0.4 mg by mouth daily. 12/09/19  Yes [provider]  acetaminophen (TYLENOL) 325 MG tablet Take 2 tablets (650 mg total) by mouth every 4 (four) hours as needed for headache or mild pain. 10/29/19   Bradly Bienenstock, NP  albuterol (PROVENTIL) (2.5 MG/3ML) 0.083% nebulizer solution Inhale 3 mLs (2.5 mg total) into the lungs as needed for shortness of breath. 10/29/19   Bradly Bienenstock, NP  benzonatate (TESSALON PERLES) 100 MG capsule Take 1 capsule (100 mg total) by mouth every 6 (six) hours as needed for cough. Patient not taking: Reported on 04/17/2022 06/16/21 06/16/22  Lavina Hamman, MD  calcium carbonate (TUMS EX) 750 MG chewable tablet Chew 1 tablet by mouth daily.    [provider]  Nebulizer MISC 1 each by Does not apply route as needed. 06/16/21   Lavina Hamman, MD  oxymetazoline (AFRIN) 0.05 % nasal spray Place 2 sprays into both nostrils 2 (two) times daily as needed for congestion (nose bleeds). Patient not taking: Reported on 04/17/2022 07/13/21 07/13/22  Naaman Plummer, MD  tadalafil (CIALIS) 20 MG tablet Take 20 mg by mouth daily. 09/04/21   [provider]  torsemide (DEMADEX) 20 MG tablet Take 1 tablet (20 mg total) by mouth daily as needed. for up to 3 days for increased leg swelling, shortness of breath, weight gain 5+ lbs over 1-2 days. Seek medical care if these symptoms are not improving with increased dose. 02/12/22 03/14/22  Emeterio Reeve, DO   Allergies  Allergen Reactions   Nitrofuran Derivatives Other (See  Comments)    Transaminitis ** confounded w/Amiodarone, Mexiletine   Spironolactone     Significant transaminitis    Review of Systems  All other systems reviewed and are negative.   Physical Exam Pulmonary:     Effort: Pulmonary effort is normal.  Neurological:     Mental Status: He is alert.     Vital Signs: BP (!) 126/52 (BP Location: Right Arm)   Pulse 68   Temp 97.9 F (36.6 C) (Oral)   Resp 20   Ht '5\' 8"'$  (1.727 m)   Wt 61.5 kg   SpO2 97%   BMI 20.62 kg/m  Pain Scale: 0-10 POSS *See Group Information*: S-Acceptable,Sleep, easy to arouse Pain Score: 0-No pain   SpO2: SpO2: 97 % O2 Device:SpO2: 97 % O2 Flow Rate: .O2 Flow Rate (L/min): 4 L/min  IO: Intake/output summary:  Intake/Output Summary (Last 24 hours) at 05/02/2022 1259 Last data filed at 05/02/2022 0518 Gross  per 24 hour  Intake 209.77 ml  Output 1200 ml  Net -990.23 ml    LBM: Last BM Date : 05/01/22 Baseline Weight: Weight: 63.6 kg Most recent weight: Weight: 61.5 kg       Signed by: Asencion Gowda, NP   Please contact Palliative Medicine Team phone at [phone] for questions and concerns.  For individual provider: See Shea Evans

## 2022-05-02 NOTE — Progress Notes (Signed)
Progress Note   Patient: Matthew Zamora PZW:[id number] DOB: 08/03/1940 DOA: 04/17/2022     14 DOS: the patient was seen and examined on 05/02/2022   Brief hospital course: WAGNER TANZI is a 81 y.o. male with medical history significant of hypertension, hyperlipidemia, COPD, APF on Eliquis, CHF with EF of 35-40%, CAD, anemia, CKD-3B, frequent fall, who presented on 04/17/2022 for evaluation of dizziness and lightheadedness, generalized weakness, near syncopal episodes.  This occurred in the setting of about one week of GI illness with nausea and vomiting.  In the ED, afebrile and stable vitals.   Labs notable for K 5.5, no leukocytosis, normal lactic acid x 2, AST 129, ALT 136, Tbili 1.4.  BNP mildly elevated 354.5, troponin x 2 normal, negative for Covid-19 and flu A/B.  Admitted to medicine service for further evaluation and management.    Pt later reported severe constipation at home, with associated lower abdominal pain. Improved after enema and having BM's.  11/2: pt with positive orthostatic vitals with severe symptoms of dizziness, unable to tolerate being upright.  Remains with poor PO intake, started on IV fluids.   11/3--5: ongoing N/V, dizziness. GI and cardiology consulted. IV fluids stopped 11/4 due to dyspnea and cxr with mild vascular congestion.  Eliquis held for EGD.  11/6: EGD showed erosive esophagitis, submucosal duodenal nodule - biopsies pending.      Assessment and Plan: * Near syncope Most likely due to dehydration in the setting of 1 week nausea vomiting.  Orthostatic vitals positive.  Patient appears dry on exam as well. -- Treated with IV fluids, stopped on morning of 11/4 due to dyspnea and signs of mild volume overload on labs and x-ray --Daily orthostatic vitals --Monitor on telemetry 11/7>>15: orthostatic vitals still positive but patient reports improved dizziness  --Supportive care for GI illness as outlined   Dyspnea Overnight 11/3-4, pt  reported feeling short of breath.  Had been on IV fluids for dehydration, orthostatic hypotension.  CXR obtained, showed borderline vascular congestions. --Stopped IV fluids 11/4 --Given 20 mg IV Lasix x 1 on 11/5 due to ongoing dyspnea and coarse crackles --Monitor closely 11/7: dyspnea has improved, but now found to have PNA.  Completed antibiotics for PNA. --Supplement O2 if sats < 90% or PRN for comfort 11/15-16: worsened dyspnea again, fluids had been resumed in setting of diarrhea and recurrent N/V.   --Fluids were stopped overnight, given neb treatment --Remains on 4 L O2 today, will give 20 mg IV Lasix x 1 and re-assess  Nausea & vomiting Patient reported 1 week history of nausea vomiting at home, leading to dehydration and near syncopal episodes with dizziness lightheadedness. Family report N/V issues frequently for several months, chronic issue. -- IV fluids have been stopped due to recurrent dyspnea --IV antiemetics as needed --Monitor renal function and electrolytes --GI consulted, EGD done 11/6 (see erosive esophagitis)  NSVT (nonsustained ventricular tachycardia) ([place]) Central telemetry reported brief run of V-fib followed by nonsustained VT today (11/3). Patient has AICD in place. --Consult cardiology. --On amiodarone, mexiletine. --There is drug interaction with Amio increasing effects of mexiletine, side effects of mexiletine are similar patient's presenting symptoms. --Amiodarone dose reduced --Appreciate cardiology recommendations. --Monitor on telemetry. --Maintain K above 4, mag above 2  Orthostatic hypotension With PT today, patient was extremely dizzy and lightheaded.  Supine BP 109/80, seated BP 75/50 (repeat 80/63), could not tolerating standing position. --Treated IV fluids --Daily orthostatic vitals --Fall precautions --Started midodrine & dose was up-titrated to 10  mg TID WC 11/15 - still has positive orthostatics but pt reports improved  dizziness  Abnormal LFTs On admission AST 129, ALT 136, total bili 1.4. RUQ ultrasound showed normal gallbladder and CBD, stable complex cyst in the right lobe of liver.  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   Acute hepatitis panel negative. -- Monitor LFTs daily --GI consulted  AST/ALT improving  Hyperkalemia K was 5.5 on admission, now normalized. Maintain K>4 given hx of dysrhythmias.  CKD (chronic kidney disease), stage IIIb Baseline creatinine 1.7-1.8.  Renal function on admission was at baseline. Creatinine trended up in setting of dehydration with intractable N/V, low BP's.   Cr stable, near baseline --Treated with IV fluids --Daily BMP to monitor --Avoid nephrotoxins, hypotension --Renally dose meds  Chronic systolic CHF (congestive heart failure) ([place]) On admission, hypovolemic and dehydrated. Diuretics on hold.  Was on IV fluids. Still appears overall euvolemic, but increasing dyspnea. 11/5 - CXR with borderline vascular congestion and BNP 764 up from 300's.  May be developing mild decompensation, given IV lasix x 1. --Hold diuretics & use PRN  (appears he takes PRN torsemide at home) --Started on midodrine, continue --Metoprolol on hold --Daily weights --Monitor closely --Cardiology following  Paroxysmal atrial fibrillation ([place]) Rate controlled. --Continue amiodarone, Mexitil --Oral metoprolol now on hold due to hypotension --Eliquis held for EGD, resumed this morning --On telemetry --Lopressor IV PRN for HR sustained above 110  Essential hypertension Hold metoprolol given soft BPs and severe orthostatic symptoms Diuretic on hold IV hydralazine as needed  Now on midodrine  Hyperlipidemia Lipitor on hold due to elevated LFTs  CAD (coronary artery disease) Stable, no active chest pain. Continue ASA, metoprolol. Lipitor on hold due to elevated LFTs.  COPD (chronic obstructive pulmonary disease) ([name]) Stable without  wheezing. Continue bronchodilators  Normocytic anemia Hemoglobin stable Monitor CBC  Enteritis, enteropathogenic E. coli See Diarrhea  Diarrhea Stool studies positive for enteropathogentic E. Coli.    Diarrhea improving. --Continue Xifaxan --Resume gentle maintenance fluids given minimal PO intake  Multifocal pneumonia CXR obtained today due to ongoing productive cough.  It shows bilateral opacities concerning for PNA.  Concern he maybe aspirated during intractable N/V and dry heaving.   --Completed course of Rocephin --Pulmonary hygiene --Mucinex and other supportive care PRN --Supplement O2 if needed to keep spO2>90%  Erosive esophagitis EGD on 11/6 notable for LA grade D esophagitis with no bleeding seen. --Biopsy path reviewed, benign --Continue PPI BID indefinitely --Started on Carafate --Follow up in GI clinic  Intractable nausea and vomiting Chronic x several months. See Nausea & Vomiting, Erosive esophagitis EGD on 11/5 as above Follow up with GI as outpatient.  Dizziness See Near Syncope  Transaminitis See abnormal LFTs  Hyponatremia Likely hypovolemic in setting of N/V and dehydration. Resolved.  Postural dizziness with near syncope Most likely due to orthostatic hypotension. Given nausea vomiting, vertigo is in the differential but feel is less likely.  Pt denies room spinning.  Generalized weakness Due to recurrent N/V x several months, unable to get adequate calories / nutrition. --PT/OT recommending SNF. --TOC following for placement. --Fall precautions.        Subjective: Patient awake sitting up in bed, breakfast tray had just arrived.  He denies N/V this AM, ready to try some food.  He was short of breath overnight but says that's better this AM.  No other acute complaints at this time.   Physical Exam: Vitals:   05/02/22 0244 05/02/22 0500 05/02/22 0509 05/02/22  0826  BP:   122/60 (!) 126/52  Pulse:   68 68  Resp:   17 20  Temp:    98.1 F (36.7 C) 97.9 F (36.6 C)  TempSrc:   Oral Oral  SpO2: 94%  100% 97%  Weight:  61.5 kg    Height:       General exam: awake, alert, no acute distress, underweight HEENT: Temporal wasting, moist mucus membranes, hearing grossly normal  Respiratory system: Lungs clear with diminished bases, no wheezes, normal respiratory effort. Cardiovascular system: normal S1/S2, RR, no pedal edema.   Gastrointestinal system: soft, NT, sunken abdomen Central nervous system: A&O x3. no gross focal neurologic deficits, normal speech Extremities: moves all, no edema, normal tone Skin: dry, intact, normal temperature, normal color Psychiatry: normal mood, congruent affect, judgement and insight appear normal   Data Reviewed:  Notable labs: Potassium 4.6, Cr 1.26 from 1.23, BNP 591.1, procal 0.24.  Hbg 8.6 from 9.1 overall stable.  Family Communication: None at bedside on rounds, will attempt to call daughter-in-law  Disposition: Status is: Inpatient Remains inpatient appropriate because: Ongoing nausea vomiting requiring IV antiemetics, and adequate p.o. intake secondary to same.  Needs SNF/rehab placement but not yet medically stable for discharge.   Planned Discharge Destination: Skilled nursing facility    Time spent: 40 minutes  Author: Ezekiel Slocumb, DO 05/02/2022 1:55 PM  For on call review [email or web address].

## 2022-05-02 NOTE — Progress Notes (Signed)
This RN in to patient's room at this time. Pt c/o "feeling like I can't breathe." Pt with increased work of breathing, labored breathing. Pt currently with increased O2 requirements, this RN increased O2 to 4L/min via [place] due to sats maintaining 88-89% on 2L and 3L. Pt noted to have fine crackles and expiratory wheezes in lung fields, change from previous respiratory assessment. Pt with strong productive cough, given PRNs. SEE MAR. Hospitalist notified.

## 2022-05-03 DIAGNOSIS — I5022 Chronic systolic (congestive) heart failure: Secondary | ICD-10-CM | POA: Diagnosis not present

## 2022-05-03 DIAGNOSIS — R55 Syncope and collapse: Secondary | ICD-10-CM | POA: Diagnosis not present

## 2022-05-03 DIAGNOSIS — R112 Nausea with vomiting, unspecified: Secondary | ICD-10-CM | POA: Diagnosis not present

## 2022-05-03 LAB — BASIC METABOLIC PANEL
Anion gap: 4 — ABNORMAL LOW (ref 5–15)
BUN: 15 mg/dL (ref 8–23)
CO2: 27 mmol/L (ref 22–32)
Calcium: 8.5 mg/dL — ABNORMAL LOW (ref 8.9–10.3)
Chloride: 104 mmol/L (ref 98–111)
Creatinine, Ser: 1.21 mg/dL (ref 0.61–1.24)
GFR, Estimated: >60 mL/min (ref >60)
Glucose, Bld: 87 mg/dL (ref 70–99)
Potassium: 4.4 mmol/L (ref 3.5–5.1)
Sodium: 135 mmol/L (ref 135–145)

## 2022-05-03 LAB — PHOSPHORUS: Phosphorus: 3.4 mg/dL (ref 2.5–4.6)

## 2022-05-03 LAB — MAGNESIUM: Magnesium: 1.9 mg/dL (ref 1.7–2.4)

## 2022-05-03 MED ORDER — FUROSEMIDE 10 MG/ML IJ SOLN
20.0000 mg | Freq: Once | INTRAMUSCULAR | Status: AC
  Administered 2022-05-03: 20 mg via INTRAVENOUS
  Filled 2022-05-03: qty 2

## 2022-05-03 NOTE — TOC Progression Note (Signed)
Transition of Care [facility]) - Progression Note    Patient Details  Name: Matthew Zamora MRN: [id number] Date of Birth: 14-Aug-1940  Transition of Care [facility]) CM/SW [place], [place] Phone Number: 05/03/2022, 9:42 AM  Clinical Narrative:     TOC obtained authorization. Authorization for patient: 11/17-11/21, review due 11/21, reference ID: [id number]. Plan Josem Kaufmann: [id number].  The patient will be going to [facility]. TOC contacting provider to see if the patient is medically stable for discharge.   Expected Discharge Plan: [place] Barriers to Discharge: Continued Medical Work up  Expected Discharge Plan and Services Expected Discharge Plan: [place]   Discharge Planning Services: [place] SNF                     Social Determinants of Health (SDOH) Interventions    Readmission Risk Interventions    04/22/2022    3:54 PM  Readmission Risk Prevention Plan  Transportation Screening Complete  Medication Review (RN Care Manager) Complete  PCP or Specialist appointment within 3-5 days of discharge Complete  HRI or [place] Complete  SW Recovery Care/Counseling Consult Complete  [place] Not Applicable

## 2022-05-03 NOTE — Care Management Important Message (Signed)
Important Message  Patient Details  Name: PAULETTE LYNCH MRN: [id number] Date of Birth: Dec 26, 1940   Medicare Important Message Given:  Other (see comment)  Patient is in an isolation room so I called [phone]) to review the Important Message from Medicare but there was no answer. Will try again.   Juliann Pulse A Yuvraj Pfeifer 05/03/2022, 12:01 PM

## 2022-05-03 NOTE — Care Management Important Message (Signed)
Important Message  Patient Details  Name: Matthew Zamora MRN: [id number] Date of Birth: 05-16-1941   Medicare Important Message Given:  Yes     Juliann Pulse A Tavis Kring 05/03/2022, 1:19 PM

## 2022-05-03 NOTE — Progress Notes (Signed)
Progress Note   Patient: Matthew Zamora ION:[id number] DOB: 1941/01/18 DOA: 04/17/2022     15 DOS: the patient was seen and examined on 05/03/2022   Brief hospital course: Matthew Zamora is a 81 y.o. male with medical history significant of hypertension, hyperlipidemia, COPD, APF on Eliquis, CHF with EF of 35-40%, CAD, anemia, CKD-3B, frequent fall, who presented on 04/17/2022 for evaluation of dizziness and lightheadedness, generalized weakness, near syncopal episodes.  This occurred in the setting of about one week of GI illness with nausea and vomiting.  In the ED, afebrile and stable vitals.   Labs notable for K 5.5, no leukocytosis, normal lactic acid x 2, AST 129, ALT 136, Tbili 1.4.  BNP mildly elevated 354.5, troponin x 2 normal, negative for Covid-19 and flu A/B.  Admitted to medicine service for further evaluation and management.    Pt later reported severe constipation at home, with associated lower abdominal pain. Improved after enema and having BM's.  11/2: pt with positive orthostatic vitals with severe symptoms of dizziness, unable to tolerate being upright.  Remains with poor PO intake, started on IV fluids.   11/3--5: ongoing N/V, dizziness. GI and cardiology consulted. IV fluids stopped 11/4 due to dyspnea and cxr with mild vascular congestion.  Eliquis held for EGD.  11/6: EGD showed erosive esophagitis, submucosal duodenal nodule - biopsies pending.      Assessment and Plan: * Near syncope Most likely due to dehydration in the setting of 1 week nausea vomiting.  Orthostatic vitals positive.  Patient appears dry on exam as well. -- Treated with IV fluids, stopped on morning of 11/4 due to dyspnea and signs of mild volume overload on labs and x-ray --Daily orthostatic vitals --Monitor on telemetry 11/7>>15: orthostatic vitals still positive but patient reports improved dizziness  --Supportive care for GI illness as outlined   Dyspnea Acute respiratory  failure with hypoxia likely due to pneumonia and ?CHF/fluid overload. Overnight 11/3-4, pt reported feeling short of breath.  Had been on IV fluids for dehydration, orthostatic hypotension.  CXR obtained, showed borderline vascular congestions. --Supplement O2 if sats < 90% or PRN for comfort --Stopped IV fluids 11/4 --Given 20 mg IV Lasix x 1 on 11/5 due to ongoing dyspnea and coarse crackles --Monitor closely 11/7: dyspnea has improved, but now found to have PNA.  Completed antibiotics for PNA. --11/15-16: worsened dyspnea again, fluids had been resumed in setting of diarrhea and recurrent N/V.   --Fluids were stopped overnight, given neb treatment --11/16 -- Remains on 4 L O2 today, will give 20 mg IV Lasix x 1 and re-assess -- 11/17 -- Still on 4 L/min O2, will give another 20 mg IV Lasix today and re-assess  Nausea & vomiting Patient reported 1 week history of nausea vomiting at home, leading to dehydration and near syncopal episodes with dizziness lightheadedness. Family report N/V issues frequently for several months, chronic issue. -- IV fluids have been stopped due to recurrent dyspnea --IV antiemetics as needed --Monitor renal function and electrolytes --GI consulted, EGD done 11/6 (see erosive esophagitis)  NSVT (nonsustained ventricular tachycardia) ([place]) Central telemetry reported brief run of V-fib followed by nonsustained VT today (11/3). Patient has AICD in place. --Consult cardiology. --On amiodarone, mexiletine. --There is drug interaction with Amio increasing effects of mexiletine, side effects of mexiletine are similar patient's presenting symptoms. --Amiodarone dose reduced --Appreciate cardiology recommendations. --Monitor on telemetry. --Maintain K above 4, mag above 2  Orthostatic hypotension With PT today, patient was extremely dizzy  and lightheaded.  Supine BP 109/80, seated BP 75/50 (repeat 80/63), could not tolerating standing position. --Treated IV  fluids --Daily orthostatic vitals --Fall precautions --Started midodrine & dose was up-titrated to 10 mg TID WC 11/15 - still has positive orthostatics but pt reports improved dizziness  Abnormal LFTs On admission AST 129, ALT 136, total bili 1.4. RUQ ultrasound showed normal gallbladder and CBD, stable complex cyst in the right lobe of liver.  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   Acute hepatitis panel negative. -- Monitor LFTs daily --GI consulted  AST/ALT improving  Hyperkalemia K was 5.5 on admission, now normalized. Maintain K>4 given hx of dysrhythmias.  CKD (chronic kidney disease), stage IIIb Baseline creatinine 1.7-1.8.  Renal function on admission was at baseline. Creatinine trended up in setting of dehydration with intractable N/V, low BP's.   Cr stable, near baseline --Treated with IV fluids --Daily BMP to monitor --Avoid nephrotoxins, hypotension --Renally dose meds  Chronic systolic CHF (congestive heart failure) ([place]) On admission, hypovolemic and dehydrated. Diuretics on hold.  Was on IV fluids. Still appears overall euvolemic, but increasing dyspnea. 11/5 - CXR with borderline vascular congestion and BNP 764 up from 300's.  May be developing mild decompensation, given IV lasix x 1. --Hold diuretics & use PRN  (appears he takes PRN torsemide at home) --Started on midodrine, continue --Metoprolol on hold --Daily weights --Monitor closely --Cardiology following  Paroxysmal atrial fibrillation ([place]) Rate controlled. --Continue amiodarone, Mexitil --Oral metoprolol now on hold due to hypotension --Eliquis held for EGD, resumed this morning --On telemetry --Lopressor IV PRN for HR sustained above 110  Essential hypertension Hold metoprolol given soft BPs and severe orthostatic symptoms Diuretic on hold IV hydralazine as needed  Now on midodrine  Hyperlipidemia Lipitor on hold due to elevated LFTs  CAD (coronary artery  disease) Stable, no active chest pain. Continue ASA, metoprolol. Lipitor on hold due to elevated LFTs.  COPD (chronic obstructive pulmonary disease) ([name]) Stable without wheezing. Continue bronchodilators  Normocytic anemia Hemoglobin stable Monitor CBC  Enteritis, enteropathogenic E. coli See Diarrhea  Diarrhea Stool studies positive for enteropathogentic E. Coli.    Diarrhea improving. --Continue Xifaxan --Resume gentle maintenance fluids given minimal PO intake  Multifocal pneumonia CXR obtained today due to ongoing productive cough.  It shows bilateral opacities concerning for PNA.  Concern he maybe aspirated during intractable N/V and dry heaving.   --Completed course of Rocephin --Pulmonary hygiene --Mucinex and other supportive care PRN --Supplement O2 if needed to keep spO2>90%  Erosive esophagitis EGD on 11/6 notable for LA grade D esophagitis with no bleeding seen. --Biopsy path reviewed, benign --Continue PPI BID indefinitely --Started on Carafate --Follow up in GI clinic  Intractable nausea and vomiting Chronic x several months. See Nausea & Vomiting, Erosive esophagitis EGD on 11/5 as above Follow up with GI as outpatient.  Dizziness See Near Syncope  Transaminitis See abnormal LFTs  Hyponatremia Likely hypovolemic in setting of N/V and dehydration. Resolved.  Postural dizziness with near syncope Most likely due to orthostatic hypotension. Given nausea vomiting, vertigo is in the differential but feel is less likely.  Pt denies room spinning.  Generalized weakness Due to recurrent N/V x several months, unable to get adequate calories / nutrition. --PT/OT recommending SNF. --TOC following for placement. --Fall precautions.        Subjective: Patient was awake but quite drowsy when seen this AM.  He reported having a headache, recently given Tyelnol.  Today he says both nausea  and dizziness have improved. Still on 4 L/min oxygen but he  denies feeling short of breath.  Says he has home oxygen he uses as needed when he is short of breath.  No other acute complaints or acute events reported.    Physical Exam: Vitals:   05/02/22 2030 05/03/22 0503 05/03/22 0813 05/03/22 1313  BP: (!) 141/54 124/67 (!) 122/54 (!) 94/48  Pulse: 67 63 65 69  Resp: '16 16 16 18  '$ Temp: 98.1 F (36.7 C) 98.7 F (37.1 C) 97.7 F (36.5 C) 98.2 F (36.8 C)  TempSrc: Oral   Oral  SpO2: 92% 100% 99% 97%  Weight:  59.2 kg    Height:       General exam: awake, appears drowsy, no acute distress, underweight HEENT: Temporal wasting, moist mucus membranes, hearing grossly normal  Respiratory system: Lungs clear with diminished bases, no wheezes, normal respiratory effort. Cardiovascular system: normal S1/S2, RR, no pedal edema.   Gastrointestinal system: soft, NT, sunken abdomen Central nervous system: A&O x3. no gross focal neurologic deficits, normal speech Extremities: moves all, no edema, normal tone Skin: dry, intact, normal temperature, normal color Psychiatry: normal mood, congruent affect, judgement and insight appear normal   Data Reviewed:  Notable labs:   Ca 8.5, gap 4, otherwise normal BMP.  No CBC today.   Family Communication: Updated daughter-in-law and son by phone this afternoon (11/17)  Disposition: Status is: Inpatient Remains inpatient appropriate because: Ongoing nausea vomiting requiring IV antiemetics, and adequate p.o. intake secondary to same.  Needs SNF/rehab placement but not yet medically stable for discharge.   Planned Discharge Destination: Skilled nursing facility    Time spent: 40 minutes  Author: Ezekiel Slocumb, DO 05/03/2022 2:39 PM  For on call review [email or web address].

## 2022-05-03 NOTE — Progress Notes (Signed)
Physical Therapy Treatment Patient Details Name: Matthew Zamora MRN: [id number] DOB: Oct 25, 1940 Today's Date: 05/03/2022   History of Present Illness Pt is an 81 year old male presenting to ED with lightheadedness; PMH significant for  hypertension, hyperlipidemia, COPD, APF on Eliquis, CHF with EF of 35-40%, CAD, anemia, CKD-3B, frequent fall    PT Comments    Pt was pleasant and motivated to participate during the session and put forth good effort throughout. Orthostatic BPs taken as follows:  Supine: 102/46, HR 62; Sitting: 91/50, HR 77 (min dizziness); Standing: 83/51, HR 77 (mod dizziness).  Pt required extra time and effort with sup to/from sit but no physical assistance.  Pt able to maintain static sitting at EOB without physical assist but with some noted effort needed to prevent lateral lean.  Pt unable to maintain static standing during BP without min A for stability but was able to remain standing for entire BP.  Pt unable to advance either LE once BP was completed and c/o mod dizziness and was returned to sitting where symptoms resolved quickly.  Pt will benefit from PT services in a SNF setting upon discharge to safely address deficits listed in patient problem list for decreased caregiver assistance and eventual return to PLOF.      Recommendations for follow up therapy are one component of a multi-disciplinary discharge planning process, led by the attending physician.  Recommendations may be updated based on patient status, additional functional criteria and insurance authorization.  Follow Up Recommendations  Skilled nursing-short term rehab (<3 hours/day) Can patient physically be transported by private vehicle: No   Assistance Recommended at Discharge Intermittent Supervision/Assistance  Patient can return home with the following A little help with walking and/or transfers;A little help with bathing/dressing/bathroom;Assist for transportation;Help with stairs or ramp for  entrance;Assistance with cooking/housework   Equipment Recommendations  None recommended by PT    Recommendations for Other Services       Precautions / Restrictions Precautions Precautions: Fall Precaution Comments: orthostatic Restrictions Weight Bearing Restrictions: No     Mobility  Bed Mobility Overal bed mobility: Modified Independent             General bed mobility comments: Extra time and effort only    Transfers Overall transfer level: Needs assistance Equipment used: Rolling walker (2 wheels) Transfers: Sit to/from Stand Sit to Stand: Min assist           General transfer comment: Min verbal cues for hand placement and min A for stability in standing    Ambulation/Gait               General Gait Details: Unable to advance LEs   Stairs             Wheelchair Mobility    Modified Rankin (Stroke Patients Only)       Balance Overall balance assessment: History of Falls, Needs assistance Sitting-balance support: Feet supported, Bilateral upper extremity supported Sitting balance-Leahy Scale: Fair     Standing balance support: Bilateral upper extremity supported, Reliant on assistive device for balance Standing balance-Leahy Scale: Poor                              Cognition Arousal/Alertness: Awake/alert Behavior During Therapy: WFL for tasks assessed/performed Overall Cognitive Status: Within Functional Limits for tasks assessed  Exercises Total Joint Exercises Ankle Circles/Pumps: Strengthening, Both, 10 reps Quad Sets: Strengthening, Both, 10 reps Gluteal Sets: Strengthening, Both, 10 reps Heel Slides: Strengthening, Both, 10 reps Hip ABduction/ADduction: Strengthening, Both, 5 reps Straight Leg Raises: Strengthening, Both, 5 reps Long Arc Quad: Strengthening, Both, 5 reps, 10 reps Knee Flexion: Strengthening, 5 reps, 10 reps, Both Other  Exercises Other Exercises: HEP review Other Exercises: lateral scooting at EOB x 3 feet    General Comments        Pertinent Vitals/Pain Pain Assessment Pain Assessment: 0-10 Pain Score: 3  Pain Location: bottom Pain Descriptors / Indicators: Sore Pain Intervention(s): Repositioned, Premedicated before session, Monitored during session    Home Living                          Prior Function            PT Goals (current goals can now be found in the care plan section) Progress towards PT goals: PT to reassess next treatment    Frequency    Min 2X/week      PT Plan Current plan remains appropriate    Co-evaluation              AM-PAC PT "6 Clicks" Mobility   Outcome Measure  Help needed turning from your back to your side while in a flat bed without using bedrails?: None Help needed moving from lying on your back to sitting on the side of a flat bed without using bedrails?: None Help needed moving to and from a bed to a chair (including a wheelchair)?: A Little Help needed standing up from a chair using your arms (e.g., wheelchair or bedside chair)?: A Little Help needed to walk in hospital room?: A Lot Help needed climbing 3-5 steps with a railing? : Total 6 Click Score: 17    End of Session Equipment Utilized During Treatment: Gait belt;Oxygen Activity Tolerance: Other (comment) (limited by dizziness) Patient left: in bed;with call bell/phone within reach;with bed alarm set Nurse Communication: Mobility status PT Visit Diagnosis: Unsteadiness on feet (R26.81);Muscle weakness (generalized) (M62.81);History of falling (Z91.81);Difficulty in walking, not elsewhere classified (R26.2)     Time: 1448-1856 PT Time Calculation (min) (ACUTE ONLY): 28 min  Charges:  $Therapeutic Exercise: 8-22 mins $Therapeutic Activity: 8-22 mins                     D. Scott Lynsey Ange PT, DPT 05/03/22, 5:13 PM

## 2022-05-03 NOTE — Progress Notes (Signed)
Occupational Therapy Treatment Patient Details Name: Matthew Zamora MRN: [id number] DOB: 1940/08/15 Today's Date: 05/03/2022   History of present illness Pt is an 81 year old male presenting to ED with lightheadedness; PMH significant for  hypertension, hyperlipidemia, COPD, APF on Eliquis, CHF with EF of 35-40%, CAD, anemia, CKD-3B, frequent fall   OT comments  Pt seen for OT tx this date. Pt agreeable to OT tx. Pt completed bed mobility with mod indep and no dizziness noted. Pt sat EOB unsupported with set up for grooming tasks. No LOB, supv only. Pt tolerated well, denies dizziness throughout. Endorses mild headache and RN notified per pt's request. Pt able to roll in bed for repositioning of foam pads at end of session without direct assist. Nurse tech in room for purewick change. Pt progressing towards goals. Continues to benefit from skilled OT services and pt continues to require SpO2. SNF remains appropriate at this time.    Recommendations for follow up therapy are one component of a multi-disciplinary discharge planning process, led by the attending physician.  Recommendations may be updated based on patient status, additional functional criteria and insurance authorization.    Follow Up Recommendations  Skilled nursing-short term rehab (<3 hours/day)     Assistance Recommended at Discharge Frequent or constant Supervision/Assistance  Patient can return home with the following  A lot of help with walking and/or transfers;A lot of help with bathing/dressing/bathroom   Equipment Recommendations  Other (comment) (defer to next venue)    Recommendations for Other Services      Precautions / Restrictions Precautions Precautions: Fall Precaution Comments: orthostatic Restrictions Weight Bearing Restrictions: No       Mobility Bed Mobility Overal bed mobility: Modified Independent Bed Mobility: Supine to Sit, Sit to Supine     Supine to sit: Modified independent  (Device/Increase time) Sit to supine: Modified independent (Device/Increase time)   General bed mobility comments: no assist required, denies dizziness    Transfers                   General transfer comment: declined     Balance Overall balance assessment: History of Falls, Needs assistance Sitting-balance support: Single extremity supported, No upper extremity supported, Feet supported Sitting balance-Leahy Scale: Fair                                     ADL either performed or assessed with clinical judgement   ADL Overall ADL's : Needs assistance/impaired     Grooming: Wash/dry face;Sitting;Set up;Oral care Grooming Details (indicate cue type and reason): pt set up for grooming tasks seated EOB with no LOB and supv only, denied dizziness, tolerated well                                    Extremity/Trunk Assessment              Vision       Perception     Praxis      Cognition Arousal/Alertness: Awake/alert Behavior During Therapy: WFL for tasks assessed/performed Overall Cognitive Status: Within Functional Limits for tasks assessed  Exercises      Shoulder Instructions       General Comments      Pertinent Vitals/ Pain       Pain Assessment Pain Assessment: 0-10 Pain Score: 3  Pain Location: headache Pain Descriptors / Indicators: Headache Pain Intervention(s): Limited activity within patient's tolerance, Monitored during session, Premedicated before session, Repositioned, Patient requesting pain meds-RN notified  Home Living                                          Prior Functioning/Environment              Frequency  Min 2X/week        Progress Toward Goals  OT Goals(current goals can now be found in the care plan section)  Progress towards OT goals: Progressing toward goals  Acute Rehab OT Goals Patient Stated  Goal: feel better OT Goal Formulation: With patient Time For Goal Achievement: 05/15/22 Potential to Achieve Goals: Good  Plan Discharge plan remains appropriate;Frequency remains appropriate    Co-evaluation                 AM-PAC OT "6 Clicks" Daily Activity     Outcome Measure   Help from another person eating meals?: None Help from another person taking care of personal grooming?: A Little Help from another person toileting, which includes using toliet, bedpan, or urinal?: A Lot Help from another person bathing (including washing, rinsing, drying)?: A Lot Help from another person to put on and taking off regular upper body clothing?: A Little Help from another person to put on and taking off regular lower body clothing?: A Lot 6 Click Score: 16    End of Session Equipment Utilized During Treatment: Oxygen  OT Visit Diagnosis: Unsteadiness on feet (R26.81);Muscle weakness (generalized) (M62.81);Repeated falls (R29.6)   Activity Tolerance Patient tolerated treatment well   Patient Left in bed;with call bell/phone within reach;with nursing/sitter in room (with nurse tech for purewick change)   Nurse Communication          Time: 6967-8938 OT Time Calculation (min): 14 min  Charges: OT General Charges $OT Visit: 1 Visit OT Treatments $Self Care/Home Management : 8-22 mins  Ardeth Perfect., MPH, MS, OTR/L ascom [phone] 05/03/22, 2:54 PM

## 2022-05-04 DIAGNOSIS — I951 Orthostatic hypotension: Secondary | ICD-10-CM | POA: Diagnosis not present

## 2022-05-04 DIAGNOSIS — R531 Weakness: Secondary | ICD-10-CM | POA: Diagnosis not present

## 2022-05-04 DIAGNOSIS — R42 Dizziness and giddiness: Secondary | ICD-10-CM | POA: Diagnosis not present

## 2022-05-04 DIAGNOSIS — R112 Nausea with vomiting, unspecified: Secondary | ICD-10-CM | POA: Diagnosis not present

## 2022-05-04 LAB — COMPREHENSIVE METABOLIC PANEL
ALT: 85 U/L — ABNORMAL HIGH (ref 0–44)
AST: 70 U/L — ABNORMAL HIGH (ref 15–41)
Albumin: 2.2 g/dL — ABNORMAL LOW (ref 3.5–5.0)
Alkaline Phosphatase: 129 U/L — ABNORMAL HIGH (ref 38–126)
Anion gap: 6 (ref 5–15)
BUN: 13 mg/dL (ref 8–23)
CO2: 27 mmol/L (ref 22–32)
Calcium: 8.7 mg/dL — ABNORMAL LOW (ref 8.9–10.3)
Chloride: 101 mmol/L (ref 98–111)
Creatinine, Ser: 1.2 mg/dL (ref 0.61–1.24)
GFR, Estimated: >60 mL/min (ref >60)
Glucose, Bld: 82 mg/dL (ref 70–99)
Potassium: 4.5 mmol/L (ref 3.5–5.1)
Sodium: 134 mmol/L — ABNORMAL LOW (ref 135–145)
Total Bilirubin: 0.9 mg/dL (ref 0.3–1.2)
Total Protein: 5.4 g/dL — ABNORMAL LOW (ref 6.5–8.1)

## 2022-05-04 LAB — CBC
HCT: 27.4 % — ABNORMAL LOW (ref 39.0–52.0)
Hemoglobin: 8.7 g/dL — ABNORMAL LOW (ref 13.0–17.0)
MCH: 28.6 pg (ref 26.0–34.0)
MCHC: 31.8 g/dL (ref 30.0–36.0)
MCV: 90.1 fL (ref 80.0–100.0)
Platelets: 407 10*3/uL — ABNORMAL HIGH (ref 150–400)
RBC: 3.04 MIL/uL — ABNORMAL LOW (ref 4.22–5.81)
RDW: 14.5 % (ref 11.5–15.5)
WBC: 7.4 10*3/uL (ref 4.0–10.5)
nRBC: 0 % (ref 0.0–0.2)

## 2022-05-04 LAB — MAGNESIUM: Magnesium: 1.9 mg/dL (ref 1.7–2.4)

## 2022-05-04 NOTE — Progress Notes (Signed)
Progress Note   Patient: Matthew Zamora:[id number] DOB: 18-May-1941 DOA: 04/17/2022     16 DOS: the patient was seen and examined on 05/04/2022   Brief hospital course: DENIS CARREON is a 81 y.o. male with medical history significant of hypertension, hyperlipidemia, COPD, APF on Eliquis, CHF with EF of 35-40%, CAD, anemia, CKD-3B, frequent fall, who presented on 04/17/2022 for evaluation of dizziness and lightheadedness, generalized weakness, near syncopal episodes.  This occurred in the setting of about one week of GI illness with nausea and vomiting.  In the ED, afebrile and stable vitals.   Labs notable for K 5.5, no leukocytosis, normal lactic acid x 2, AST 129, ALT 136, Tbili 1.4.  BNP mildly elevated 354.5, troponin x 2 normal, negative for Covid-19 and flu A/B.  Admitted to medicine service for further evaluation and management.    11/2: pt with positive orthostatic vitals with severe symptoms of dizziness, unable to tolerate being upright.  Remains with poor PO intake, started on IV fluids.   11/3--5: ongoing N/V, dizziness. GI and cardiology consulted.  IV fluids stopped 11/4 due to dyspnea and cxr with mild vascular congestion.  Eliquis held for EGD.  11/6: EGD showed erosive esophagitis, submucosal duodenal nodule - biopsies benign.  Eliquis resumed.  Hospital course has been prolonged and complicated by worsened acute respiratory failure with hypoxia, increased O2 requirements.  Given intermittent diuresis as BP allows.  He had multifocal pneumonia, completed course of antibiotics.  He later had E. Coli infection with diarrhea which has resolved.    Pt has persistently had positive orthostatic BP's, ongoing dizziness when upright but has overall improved.  He continues to have intermittent nausea/vomiting some days but appears in tolerating adequate PO intake.        Assessment and Plan: * Near syncope Most likely due to dehydration in the setting of 1 week nausea  vomiting.  Orthostatic vitals positive.  Patient appears dry on exam as well. -- Treated with IV fluids, stopped on morning of 11/4 due to dyspnea and signs of mild volume overload on labs and x-ray --Daily orthostatic vitals --Monitor on telemetry 11/7>>15: orthostatic vitals still positive but patient reports improved dizziness  --Supportive care for GI illness as outlined   Dyspnea Acute respiratory failure with hypoxia likely due to pneumonia and ?CHF/fluid overload. Overnight 11/3-4, pt reported feeling short of breath.  Had been on IV fluids for dehydration, orthostatic hypotension.  CXR obtained, showed borderline vascular congestions. --Supplement O2 if sats < 90% or PRN for comfort --Stopped IV fluids 11/4 --Given 20 mg IV Lasix x 1 on 11/5 due to ongoing dyspnea and coarse crackles --Monitor closely 11/7: dyspnea has improved, but now found to have PNA.  Completed antibiotics for PNA. --11/15-16: worsened dyspnea again, fluids had been resumed in setting of diarrhea and recurrent N/V.   --Fluids were stopped overnight, given neb treatment --11/16 -- Remains on 4 L O2 today, will give 20 mg IV Lasix x 1 and re-assess -- 11/17 -- Still on 4 L/min O2, will give another 20 mg IV Lasix today and re-assess -- 11/18 -- attempt to wean O2 down from 4 L given sats in upper 90's.  Nausea & vomiting Patient reported 1 week history of nausea vomiting at home, leading to dehydration and near syncopal episodes with dizziness lightheadedness. Family report N/V issues frequently for several months, chronic issue. -- IV fluids have been stopped due to recurrent dyspnea --IV antiemetics as needed --Monitor renal function and  electrolytes --GI consulted, EGD done 11/6 (see erosive esophagitis)  NSVT (nonsustained ventricular tachycardia) ([place]) Central telemetry reported brief run of V-fib followed by nonsustained VT today (11/3). Patient has AICD in place. --Consult cardiology. --On  amiodarone, mexiletine. --There is drug interaction with Amio increasing effects of mexiletine, side effects of mexiletine are similar patient's presenting symptoms. --Amiodarone dose reduced --Appreciate cardiology recommendations. --Monitor on telemetry. --Maintain K above 4, mag above 2  Orthostatic hypotension With PT today, patient was extremely dizzy and lightheaded.  Supine BP 109/80, seated BP 75/50 (repeat 80/63), could not tolerating standing position. --Treated IV fluids --Daily orthostatic vitals --Fall precautions --Started midodrine & dose was up-titrated to 10 mg TID WC 11/15 - still has positive orthostatics but pt reports improved dizziness  Abnormal LFTs On admission AST 129, ALT 136, total bili 1.4. RUQ ultrasound showed normal gallbladder and CBD, stable complex cyst in the right lobe of liver.  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   Acute hepatitis panel negative. -- Monitor LFTs daily --GI consulted  AST/ALT improving  Hyperkalemia K was 5.5 on admission, now normalized. Maintain K>4 given hx of dysrhythmias.  CKD (chronic kidney disease), stage IIIb Baseline creatinine 1.7-1.8.  Renal function on admission was at baseline. Creatinine trended up in setting of dehydration with intractable N/V, low BP's.   Cr stable, near baseline --Treated with IV fluids --Daily BMP to monitor --Avoid nephrotoxins, hypotension --Renally dose meds  Chronic systolic CHF (congestive heart failure) ([place]) On admission, hypovolemic and dehydrated. Diuretics on hold.  Was on IV fluids. Still appears overall euvolemic, but increasing dyspnea. 11/5 - CXR with borderline vascular congestion and BNP 764 up from 300's.  May be developing mild decompensation, given IV lasix x 1. --Hold diuretics & use PRN  (appears he takes PRN torsemide at home) --Started on midodrine, continue --Metoprolol on hold --Daily weights --Monitor closely --Cardiology  following  Paroxysmal atrial fibrillation ([place]) Rate controlled. --Continue amiodarone, Mexitil --Oral metoprolol now on hold due to hypotension --Eliquis held for EGD, resumed this morning --On telemetry --Lopressor IV PRN for HR sustained above 110  Essential hypertension Hold metoprolol given soft BPs and severe orthostatic symptoms Diuretic on hold IV hydralazine as needed  Now on midodrine  Hyperlipidemia Lipitor on hold due to elevated LFTs  CAD (coronary artery disease) Stable, no active chest pain. Continue ASA, metoprolol. Lipitor on hold due to elevated LFTs.  COPD (chronic obstructive pulmonary disease) ([name]) Stable without wheezing. Continue bronchodilators  Normocytic anemia Hemoglobin stable Monitor CBC  Enteritis, enteropathogenic E. coli See Diarrhea  Diarrhea Stool studies positive for enteropathogentic E. Coli.    Diarrhea resolved. --Continue Xifaxan --Off maintenance IV fluids  Multifocal pneumonia CXR obtained today due to ongoing productive cough.  It shows bilateral opacities concerning for PNA.  Concern he maybe aspirated during intractable N/V and dry heaving.   --Completed course of Rocephin --Pulmonary hygiene --Mucinex and other supportive care PRN --Supplement O2 if needed to keep spO2>90%  Erosive esophagitis EGD on 11/6 notable for LA grade D esophagitis with no bleeding seen. --Biopsy path reviewed, benign --Continue PPI BID indefinitely --Started on Carafate --Follow up in GI clinic  Intractable nausea and vomiting Chronic x several months. See Nausea & Vomiting, Erosive esophagitis EGD on 11/5 as above Follow up with GI as outpatient.  Dizziness See Near Syncope  Transaminitis See abnormal LFTs  Hyponatremia Likely hypovolemic in setting of N/V and dehydration. Resolved.  Postural dizziness with near syncope Most likely due to  orthostatic hypotension. Given nausea vomiting, vertigo is in the differential but  feel is less likely.  Pt denies room spinning.  Generalized weakness Due to recurrent N/V x several months, unable to get adequate calories / nutrition. --PT/OT recommending SNF. --TOC following for placement. --Fall precautions.        Subjective: Patient was awake resting in bed, holding emesis bag this AM.  He had eaten breakfast, then developed recurrent nausea, states he vomited a little.  Says he has some good days, some bad with the N/V.  Denies dizziness in the bed, still gets dizzy when upright but less severe.  Physical Exam: Vitals:   05/04/22 0755 05/04/22 0926 05/04/22 0934 05/04/22 1229  BP: 99/85 (!) 102/53  (!) 102/56  Pulse: 80 66  64  Resp:    16  Temp: 97.9 F (36.6 C)   97.6 F (36.4 C)  TempSrc:    Oral  SpO2: 98% 100% 100% 100%  Weight:      Height:       General exam: awake, appears drowsy, no acute distress, underweight HEENT: Temporal wasting, moist mucus membranes, hearing grossly normal  Respiratory system: Lungs clear with diminished bases, no wheezes, normal respiratory effort. Cardiovascular system: normal S1/S2, RR, no pedal edema.   Gastrointestinal system: soft, NT, sunken abdomen Central nervous system: A&O x3. no gross focal neurologic deficits, normal speech Extremities: moves all, no edema, normal tone Skin: dry, intact, normal temperature, normal color Psychiatry: normal mood, congruent affect, judgement and insight appear normal   Data Reviewed:  Notable labs: Na 134, Alk phos 129, albumin 2.2, AST 70, ALT 85, Hbg 8.7        Family Communication: Updated daughter-in-law by phone 11/17  Disposition: Status is: Inpatient Remains inpatient appropriate because: Ongoing nausea vomiting requiring IV antiemetics, and adequate p.o. intake secondary to same.  Needs SNF/rehab placement but not yet medically stable for discharge.   Planned Discharge Destination: Skilled nursing facility    Time spent: 40 minutes  Author: Ezekiel Slocumb, DO 05/04/2022 12:33 PM  For on call review [email or web address].

## 2022-05-05 DIAGNOSIS — I951 Orthostatic hypotension: Secondary | ICD-10-CM | POA: Diagnosis not present

## 2022-05-05 DIAGNOSIS — R112 Nausea with vomiting, unspecified: Secondary | ICD-10-CM | POA: Diagnosis not present

## 2022-05-05 DIAGNOSIS — R531 Weakness: Secondary | ICD-10-CM | POA: Diagnosis not present

## 2022-05-05 DIAGNOSIS — R55 Syncope and collapse: Secondary | ICD-10-CM | POA: Diagnosis not present

## 2022-05-05 MED ORDER — ONDANSETRON 4 MG PO TBDP
8.0000 mg | ORAL_TABLET | Freq: Three times a day (TID) | ORAL | Status: DC | PRN
  Administered 2022-05-07 – 2022-05-13 (×4): 8 mg via ORAL
  Filled 2022-05-05 (×4): qty 2

## 2022-05-05 NOTE — Progress Notes (Addendum)
Progress Note   Patient: Matthew Zamora SNK:[id number] DOB: 03-Mar-1941 DOA: 04/17/2022     17 DOS: the patient was seen and examined on 05/05/2022   Brief hospital course: Matthew Zamora is a 81 y.o. male with medical history significant of hypertension, hyperlipidemia, COPD, APF on Eliquis, CHF with EF of 35-40%, CAD, anemia, CKD-3B, frequent fall, who presented on 04/17/2022 for evaluation of dizziness and lightheadedness, generalized weakness, near syncopal episodes.  This occurred in the setting of about one week of GI illness with nausea and vomiting.  In the ED, afebrile and stable vitals.   Labs notable for K 5.5, no leukocytosis, normal lactic acid x 2, AST 129, ALT 136, Tbili 1.4.  BNP mildly elevated 354.5, troponin x 2 normal, negative for Covid-19 and flu A/B.  Admitted to medicine service for further evaluation and management.    11/2: pt with positive orthostatic vitals with severe symptoms of dizziness, unable to tolerate being upright.  Remains with poor PO intake, started on IV fluids.   11/3--5: ongoing N/V, dizziness. GI and cardiology consulted.  IV fluids stopped 11/4 due to dyspnea and cxr with mild vascular congestion.  Eliquis held for EGD.  11/6: EGD showed erosive esophagitis, submucosal duodenal nodule - biopsies benign.  Eliquis resumed.  Hospital course has been prolonged and complicated by worsened acute respiratory failure with hypoxia, increased O2 requirements.  Given intermittent diuresis as BP allows.  He had multifocal pneumonia, completed course of antibiotics.  He later had E. Coli infection with diarrhea which has resolved.    Pt has persistently had positive orthostatic BP's, ongoing dizziness when upright but has overall improved.  He continues to have intermittent nausea/vomiting some days but appears in tolerating adequate PO intake.  Prognosis overall is guarded.  Patient remains high risk for clinical deterioration and readmission.         Assessment and Plan: * Near syncope Most likely due to dehydration in the setting of 1 week nausea vomiting.  Orthostatic vitals positive.  Patient appears dry on exam as well. -- Treated with IV fluids, stopped on morning of 11/4 due to dyspnea and signs of mild volume overload on labs and x-ray --Daily orthostatic vitals --Monitor on telemetry 11/7>>15: orthostatic vitals still positive but patient reports improved dizziness  --Supportive care for GI illness as outlined   Dyspnea Acute respiratory failure with hypoxia likely due to pneumonia and ?CHF/fluid overload. Overnight 11/3-4, pt reported feeling short of breath.  Had been on IV fluids for dehydration, orthostatic hypotension.  CXR obtained, showed borderline vascular congestions. --Supplement O2 if sats < 90% or PRN for comfort --Stopped IV fluids 11/4 --Given 20 mg IV Lasix x 1 on 11/5 due to ongoing dyspnea and coarse crackles --Monitor closely 11/7: dyspnea has improved, but now found to have PNA.  Completed antibiotics for PNA. --11/15-16: worsened dyspnea again, fluids had been resumed in setting of diarrhea and recurrent N/V.   --Fluids were stopped overnight, given neb treatment --11/16 -- Remains on 4 L O2 today, will give 20 mg IV Lasix x 1 and re-assess -- 11/17 -- Still on 4 L/min O2, will give another 20 mg IV Lasix today and re-assess -- 11/18 -- attempt to wean O2 down from 4 L given sats in upper 90's. -- 11/19 -- back on baseline 2 L/min O2  Nausea & vomiting Patient reported 1 week history of nausea vomiting at home, leading to dehydration and near syncopal episodes with dizziness lightheadedness. Family report N/V issues  frequently for several months, chronic issue. -- IV fluids have been stopped due to recurrent dyspnea --IV antiemetics as needed --Monitor renal function and electrolytes --GI consulted, EGD done 11/6 (see erosive esophagitis)  NSVT (nonsustained ventricular tachycardia) ([place]) Central  telemetry reported brief run of V-fib followed by nonsustained VT today (11/3). Patient has AICD in place. --Consult cardiology. --On amiodarone, mexiletine. --There is drug interaction with Amio increasing effects of mexiletine, side effects of mexiletine are similar patient's presenting symptoms. --Amiodarone dose reduced --Appreciate cardiology recommendations. --Monitor on telemetry. --Maintain K above 4, mag above 2  Orthostatic hypotension With PT today, patient was extremely dizzy and lightheaded.  Supine BP 109/80, seated BP 75/50 (repeat 80/63), could not tolerating standing position. --Treated IV fluids --Daily orthostatic vitals --Fall precautions --Started midodrine & dose was up-titrated to 10 mg TID WC --TED hose and Abdominal binder  11/19 -- still +orthostatic vitals and dizziness limiting ability to be upright or any mobility  Abnormal LFTs On admission AST 129, ALT 136, total bili 1.4. RUQ ultrasound showed normal gallbladder and CBD, stable complex cyst in the right lobe of liver.  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   Acute hepatitis panel negative. -- Monitor LFTs daily --GI consulted  AST/ALT improving  Hyperkalemia K was 5.5 on admission, now normalized. Maintain K>4 given hx of dysrhythmias.  CKD (chronic kidney disease), stage IIIb Baseline creatinine 1.7-1.8.  Renal function on admission was at baseline. Creatinine trended up in setting of dehydration with intractable N/V, low BP's.   Cr stable, near baseline --Treated with IV fluids --Daily BMP to monitor --Avoid nephrotoxins, hypotension --Renally dose meds  Chronic systolic CHF (congestive heart failure) ([place]) On admission, hypovolemic and dehydrated. Diuretics on hold.  Was on IV fluids. Still appears overall euvolemic, but increasing dyspnea. 11/5 - CXR with borderline vascular congestion and BNP 764 up from 300's.  May be developing mild decompensation, given IV  lasix x 1. --Hold diuretics & use PRN  (appears he takes PRN torsemide at home) --Started on midodrine, continue --Metoprolol on hold --Daily weights --Monitor closely --Cardiology following  Paroxysmal atrial fibrillation ([place]) Rate controlled. --Continue amiodarone, Mexitil --Oral metoprolol now on hold due to hypotension --Eliquis held for EGD, resumed this morning --On telemetry --Lopressor IV PRN for HR sustained above 110  Essential hypertension Hold metoprolol given soft BPs and severe orthostatic symptoms Diuretic on hold IV hydralazine as needed  Now on midodrine  Hyperlipidemia Lipitor on hold due to elevated LFTs  CAD (coronary artery disease) Stable, no active chest pain. Continue ASA, metoprolol. Lipitor on hold due to elevated LFTs.  COPD (chronic obstructive pulmonary disease) ([name]) Stable without wheezing. Continue bronchodilators  Normocytic anemia Hemoglobin stable Monitor CBC  Enteritis, enteropathogenic E. coli See Diarrhea  Diarrhea Stool studies positive for enteropathogentic E. Coli.    Diarrhea resolved. --Continue Xifaxan --Off maintenance IV fluids  Multifocal pneumonia CXR obtained today due to ongoing productive cough.  It shows bilateral opacities concerning for PNA.  Concern he maybe aspirated during intractable N/V and dry heaving.   --Completed course of Rocephin --Pulmonary hygiene --Mucinex and other supportive care PRN --Supplement O2 if needed to keep spO2>90%  Erosive esophagitis EGD on 11/6 notable for LA grade D esophagitis with no bleeding seen. --Biopsy path reviewed, benign --Continue PPI BID indefinitely --Started on Carafate --Follow up in GI clinic  Intractable nausea and vomiting Chronic x several months. See Nausea & Vomiting, Erosive esophagitis EGD on 11/5 as above Follow up with  GI as outpatient.  Dizziness See Near Syncope  Transaminitis See abnormal LFTs  Hyponatremia Likely hypovolemic in  setting of N/V and dehydration. Resolved.  Postural dizziness with near syncope Most likely due to orthostatic hypotension. Given nausea vomiting, vertigo is in the differential but feel is less likely.  Pt denies room spinning.  Generalized weakness Due to recurrent N/V x several months, unable to get adequate calories / nutrition. --PT/OT recommending SNF. --TOC following for placement. --Fall precautions.        Subjective: Patient reported having another headache this AM.  He attempted to get up with PT this AM, but again was very dizzy and had to return to bed.  He is agreeable to trying TED hose and abdominal binder.  Nausea better today, tolerated breakfast.    Regarding rehab, he asks about going to a facility in [place] (would prefer not to go to [facility]), he does not recall facility names.  TOC made aware.  Physical Exam: Vitals:   05/04/22 2116 05/05/22 0500 05/05/22 0551 05/05/22 0845  BP: 122/68  (!) 115/48 (!) 116/57  Pulse: 63  65 61  Resp: '16  18 20  '$ Temp: 97.6 F (36.4 C)  98.8 F (37.1 C) 98.2 F (36.8 C)  TempSrc:   Oral   SpO2: 100%  99% 99%  Weight:  55.4 kg    Height:       General exam: awake, alert, no acute distress, underweight, frail appearing HEENT: Temporal wasting, moist mucus membranes, hearing grossly normal  Respiratory system: Lungs clear with diminished bases, no wheezes, normal respiratory effort. Cardiovascular system: normal S1/S2, RR, no pedal edema.   Gastrointestinal system: soft, NT, sunken abdomen Central nervous system: A&O x3. no gross focal neurologic deficits, normal speech Extremities: moves all, no edema, normal tone Psychiatry: normal mood, congruent affect, judgement and insight appear normal   Data Reviewed:  No new labs today        Family Communication: Updated daughter-in-law by phone 11/17  Disposition: Status is: Inpatient Remains inpatient appropriate because: Persistently orthostatic with severe  dizziness.  Needs SNF/rehab placement. Anticipate will be medically ready in 24-48 hours.    Planned Discharge Destination: Skilled nursing facility    Time spent: 35 minutes  Author: Ezekiel Slocumb, DO 05/05/2022 2:03 PM  For on call review [email or web address].

## 2022-05-05 NOTE — TOC Progression Note (Addendum)
Transition of Care [facility]) - Progression Note    Patient Details  Name: FURIOUS CHIARELLI MRN: [id number] Date of Birth: [birth year]  Transition of Care [facility]) CM/SW [place], LCSW Phone Number: 05/05/2022, 9:16 AM  Clinical Narrative:    CSW notified Ricky with [facility] that per DO, patient is expected to be medically ready to DC to [facility] tomorrow.   10:45- Patient asking about going to [facility] instead of [facility]. CSW reached out to Tammy with [facility] and asked her to review referral, waiting on response. Auth will need to be changed to [facility] if [facility] can accept, TOC handoff updated.   2:03- Call from [place] who stated she will review referral.    Expected Discharge Plan: Skilled Nursing Facility Barriers to Discharge: Continued Medical Work up  Expected Discharge Plan and Services Expected Discharge Plan: [place]   Discharge Planning Services: CM Consult                                           Social Determinants of Health (SDOH) Interventions    Readmission Risk Interventions    04/22/2022    3:54 PM  Readmission Risk Prevention Plan  Transportation Screening Complete  Medication Review ([place]) Complete  PCP or Specialist appointment within 3-5 days of discharge Complete  HRI or [place] Complete  SW Recovery Care/Counseling Consult Complete  [place] Not Applicable

## 2022-05-05 NOTE — Progress Notes (Addendum)
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  He transitions to EOB and is able to stand for orthostatic vitals but unable to remain standing for 3rd reading due to dizziness and need to lay back down.  Recorded in flow sheets.  BP drops to 82/49 P 58 Map 58 but returns to baseline shortly upon laying down.  Pt continues to be limited in mobility by orthostatic BP's.    Messaged MD to see if TED home and abdominal binder would be helpful.  Orders to be placed to try.     Recommendations for follow up therapy are one component of a multi-disciplinary discharge planning process, led by the attending physician.  Recommendations may be updated based on patient status, additional functional criteria and insurance authorization.  Follow Up Recommendations  Skilled nursing-short term rehab (<3 hours/day)     Assistance Recommended at Discharge Intermittent Supervision/Assistance  Patient can return home with the following A little help with walking and/or transfers;A little help with bathing/dressing/bathroom;Assist for transportation;Help with stairs or ramp for entrance;Assistance with cooking/housework   Equipment Recommendations  None recommended by PT    Recommendations for Other Services       Precautions / Restrictions Restrictions Weight Bearing Restrictions: No     Mobility  Bed Mobility Overal bed mobility: Modified Independent                  Transfers Overall transfer level: Needs assistance Equipment used: Rolling walker (2 wheels) Transfers: Sit to/from Stand Sit to Stand: Min assist           General transfer  comment: returns to bed due to dizziness.    Ambulation/Gait                   Stairs             Wheelchair Mobility    Modified Rankin (Stroke Patients Only)       Balance Overall balance assessment: History of Falls, Needs assistance Sitting-balance support: Feet supported, Bilateral upper extremity supported Sitting balance-Leahy Scale: Fair     Standing balance support: Bilateral upper extremity supported, Reliant on assistive device for balance Standing balance-Leahy Scale: Poor                              Cognition Arousal/Alertness: Awake/alert Behavior During Therapy: WFL for tasks assessed/performed Overall Cognitive Status: Within Functional Limits for tasks assessed                                          Exercises      General Comments        Pertinent Vitals/Pain Pain Assessment Pain Assessment: Faces Faces Pain Scale: Hurts little more Pain Location: bottom Pain Descriptors / Indicators: Sore Pain Intervention(s): Limited activity within patient's tolerance, Monitored during session, Repositioned    Home Living  Prior Function            PT Goals (current goals can now be found in the care plan section) Progress towards PT goals: Progressing toward goals    Frequency    Min 2X/week      PT Plan Current plan remains appropriate    Co-evaluation              AM-PAC PT "6 Clicks" Mobility   Outcome Measure  Help needed turning from your back to your side while in a flat bed without using bedrails?: None Help needed moving from lying on your back to sitting on the side of a flat bed without using bedrails?: None Help needed moving to and from a bed to a chair (including a wheelchair)?: A Little Help needed standing up from a chair using your arms (e.g., wheelchair or bedside chair)?: A Little Help needed to walk in hospital room?: A Lot Help  needed climbing 3-5 steps with a railing? : Total 6 Click Score: 17    End of Session Equipment Utilized During Treatment: Gait belt;Oxygen Activity Tolerance: Patient tolerated treatment well Patient left: in bed;with call bell/phone within reach;with bed alarm set Nurse Communication: Mobility status;Other (comment) (orthostatic continues) PT Visit Diagnosis: Unsteadiness on feet (R26.81);Muscle weakness (generalized) (M62.81);History of falling (Z91.81);Difficulty in walking, not elsewhere classified (R26.2)     Time: 3016-0109 PT Time Calculation (min) (ACUTE ONLY): 17 min  Charges:  $Therapeutic Activity: 8-22 mins                     Chesley Noon, PTA 05/05/22, 10:25 AM

## 2022-05-06 DIAGNOSIS — R55 Syncope and collapse: Secondary | ICD-10-CM | POA: Diagnosis not present

## 2022-05-06 LAB — IRON AND TIBC
Iron: 53 ug/dL (ref 45–182)
Saturation Ratios: 30 % (ref 17.9–39.5)
TIBC: 178 ug/dL — ABNORMAL LOW (ref 250–450)
UIBC: 125 ug/dL

## 2022-05-06 LAB — CBC
HCT: 26.2 % — ABNORMAL LOW (ref 39.0–52.0)
Hemoglobin: 8.3 g/dL — ABNORMAL LOW (ref 13.0–17.0)
MCH: 28.9 pg (ref 26.0–34.0)
MCHC: 31.7 g/dL (ref 30.0–36.0)
MCV: 91.3 fL (ref 80.0–100.0)
Platelets: 424 10*3/uL — ABNORMAL HIGH (ref 150–400)
RBC: 2.87 MIL/uL — ABNORMAL LOW (ref 4.22–5.81)
RDW: 14 % (ref 11.5–15.5)
WBC: 6.9 10*3/uL (ref 4.0–10.5)
nRBC: 0 % (ref 0.0–0.2)

## 2022-05-06 LAB — COMPREHENSIVE METABOLIC PANEL
ALT: 80 U/L — ABNORMAL HIGH (ref 0–44)
AST: 65 U/L — ABNORMAL HIGH (ref 15–41)
Albumin: 2.3 g/dL — ABNORMAL LOW (ref 3.5–5.0)
Alkaline Phosphatase: 114 U/L (ref 38–126)
Anion gap: 4 — ABNORMAL LOW (ref 5–15)
BUN: 15 mg/dL (ref 8–23)
CO2: 30 mmol/L (ref 22–32)
Calcium: 8.9 mg/dL (ref 8.9–10.3)
Chloride: 101 mmol/L (ref 98–111)
Creatinine, Ser: 1.45 mg/dL — ABNORMAL HIGH (ref 0.61–1.24)
GFR, Estimated: 49 mL/min — ABNORMAL LOW (ref >60)
Glucose, Bld: 88 mg/dL (ref 70–99)
Potassium: 4.5 mmol/L (ref 3.5–5.1)
Sodium: 135 mmol/L (ref 135–145)
Total Bilirubin: 0.7 mg/dL (ref 0.3–1.2)
Total Protein: 5.4 g/dL — ABNORMAL LOW (ref 6.5–8.1)

## 2022-05-06 LAB — RETICULOCYTES
Immature Retic Fract: 15.8 % (ref 2.3–15.9)
RBC.: 2.85 MIL/uL — ABNORMAL LOW (ref 4.22–5.81)
Retic Count, Absolute: 61.6 10*3/uL (ref 19.0–186.0)
Retic Ct Pct: 2.2 % (ref 0.4–3.1)

## 2022-05-06 LAB — MAGNESIUM: Magnesium: 2.2 mg/dL (ref 1.7–2.4)

## 2022-05-06 LAB — PHOSPHORUS: Phosphorus: 4.1 mg/dL (ref 2.5–4.6)

## 2022-05-06 LAB — FOLATE: Folate: 10.7 ng/mL (ref >5.9)

## 2022-05-06 LAB — VITAMIN B12: Vitamin B-12: 1105 pg/mL — ABNORMAL HIGH (ref 180–914)

## 2022-05-06 LAB — FERRITIN: Ferritin: 598 ng/mL — ABNORMAL HIGH (ref 24–336)

## 2022-05-06 NOTE — Progress Notes (Signed)
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 Pt has ted hose donned but abdominal binder doffed. Trialed orthostatics as below without abdominal binder then trialed orthostatics with ted hose and abdominal binder.  No improvement noted with orthostatics with ted hose and abdominal binder with significant dizziness present standing at RW. Pt returning to supine placed in upright positioning. Education on trialing upright sitting to help assist in body adjusting in gravity dependent positions. RN and MD aware of vitals. D/c recs remain appropriate.   Orthostatic VS for the past 24 hrs:  BP- Lying Pulse- Lying BP- Sitting Pulse- Sitting BP- Standing at 0 minutes Pulse- Standing at 0 minutes  05/06/22 1245 109/56 61 (!) 76/48 68 (!) 67/39 83  Listed above is with ted hose and abdominal binder donned         Recommendations for follow up therapy are one component of a multi-disciplinary discharge planning process, led by the attending physician.  Recommendations may be updated based on patient status, additional functional criteria and insurance authorization.  Follow Up Recommendations  Skilled nursing-short term rehab (<3 hours/day) Can patient physically be transported by private vehicle: No   Assistance Recommended at Discharge Intermittent Supervision/Assistance  Patient can return home with the following A little help with walking and/or transfers;A little help with bathing/dressing/bathroom;Assist for transportation;Help with stairs or ramp for entrance;Assistance with cooking/housework   Equipment Recommendations  None  recommended by PT    Recommendations for Other Services       Precautions / Restrictions Precautions Precautions: Fall Precaution Comments: orthostatic Restrictions Weight Bearing Restrictions: No     Mobility  Bed Mobility Overal bed mobility: Modified Independent Bed Mobility: Supine to Sit, Sit to Supine     Supine to sit: Modified independent (Device/Increase time) Sit to supine: Modified independent (Device/Increase time)     Patient Response: Cooperative  Transfers Overall transfer level: Needs assistance Equipment used: Rolling walker (2 wheels) Transfers: Sit to/from Stand Sit to Stand: Min guard           General transfer comment: returns to bed due to dizziness.    Ambulation/Gait               General Gait Details: unable due to dizziness   Stairs             Wheelchair Mobility    Modified Rankin (Stroke Patients Only)       Balance Overall balance assessment: History of Falls, Needs assistance Sitting-balance support: Feet supported, Bilateral upper extremity supported Sitting balance-Leahy Scale: Fair Sitting balance - Comments: intermittent L lateral lean due to dizziness Postural control: Left lateral lean Standing balance support: Bilateral upper extremity supported, Reliant on assistive device for balance Standing balance-Leahy Scale: Poor Standing balance comment: Initially fair but as pt stands prolonged becomes dizzy                            Cognition Arousal/Alertness: Awake/alert Behavior During Therapy: WFL for tasks assessed/performed Overall Cognitive Status: Within Functional Limits for tasks assessed  Exercises Other Exercises Other Exercises: benefits of upright sitting to attempt improved tolerance for upright activity    General Comments General comments (skin integrity, edema, etc.): positive orthostatics despite ted hose and  abdominal binder      Pertinent Vitals/Pain Pain Assessment Pain Assessment: No/denies pain    Home Living                          Prior Function            PT Goals (current goals can now be found in the care plan section) Acute Rehab PT Goals Patient Stated Goal: to return home PT Goal Formulation: With patient Time For Goal Achievement: 05/16/22 Potential to Achieve Goals: Fair Progress towards PT goals: Not progressing toward goals - comment (unable to tolerate transfers or gait due to orthostasis)    Frequency    Min 2X/week      PT Plan Current plan remains appropriate    Co-evaluation              AM-PAC PT "6 Clicks" Mobility   Outcome Measure  Help needed turning from your back to your side while in a flat bed without using bedrails?: None Help needed moving from lying on your back to sitting on the side of a flat bed without using bedrails?: None Help needed moving to and from a bed to a chair (including a wheelchair)?: A Little Help needed standing up from a chair using your arms (e.g., wheelchair or bedside chair)?: A Little Help needed to walk in hospital room?: A Lot Help needed climbing 3-5 steps with a railing? : Total 6 Click Score: 17    End of Session Equipment Utilized During Treatment: Gait belt;Oxygen Activity Tolerance: Patient tolerated treatment well;Treatment limited secondary to medical complications (Comment) Patient left: in bed;with call bell/phone within reach;with bed alarm set Nurse Communication: Mobility status;Other (comment) (MD and RN aware orthostasis remains) PT Visit Diagnosis: Unsteadiness on feet (R26.81);Muscle weakness (generalized) (M62.81);History of falling (Z91.81);Difficulty in walking, not elsewhere classified (R26.2)     Time: 1245-8099 PT Time Calculation (min) (ACUTE ONLY): 30 min  Charges:  $Therapeutic Activity: 23-37 mins                    Keigan Tafoya M. Fairly IV, PT, DPT Physical  Therapist- [facility]  05/06/2022, 12:56 PM

## 2022-05-06 NOTE — TOC Transition Note (Addendum)
Transition of Care [facility]) - CM/SW Discharge Note   Patient Details  Name: Matthew Zamora MRN: [id number] Date of Birth: [birth year]  [facility] CM/SW Contact:  Colen Darling, [place] Phone Number: 05/06/2022, 9:30 AM   Clinical Narrative:     Patient is requesting to switch authorization from [facility] to [facility].  TOC reached out to Gena at [employer] and she will follow up with Tammy regarding the referral. If [facility], TOC will contact [facility] regarding switching authorization from [facility] to [facility].  The patient will need authorization extended so that he can transfer to [facility].  Final next level of care: Skilled Nursing Facility Barriers to Discharge: Continued Medical Work up   Patient Goals and CMS Choice Patient states their goals for this hospitalization and ongoing recovery are:: attend rehab CMS Medicare.gov Compare Post Acute Care list provided to:: Patient Represenative (must comment) [place])    Discharge Placement                       Discharge Plan and Services   Discharge Planning Services: CM Consult                                 Social Determinants of Health (SDOH) Interventions     Readmission Risk Interventions    04/22/2022    3:54 PM  Readmission Risk Prevention Plan  Transportation Screening Complete  Medication Review ([place]) Complete  PCP or Specialist appointment within 3-5 days of discharge Complete  HRI or [place] Complete  SW Recovery Care/Counseling Consult Complete  [place] Not Applicable

## 2022-05-06 NOTE — Progress Notes (Signed)
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  The patient had recently previously had multiple issues with ventricular tachycardia for which she was placed on both amiodarone and mexiletine.  These medications did work along with a defibrillator was placed.  Since admission here the patient has had a work-up of endoscopy showing erosive gastritis and esophagitis for which has not likely contributed to above.  He also has had a recent respiratory issues with multifocal pneumonia antibiotics and E. coli infection with diarrhea which is resolved.  Now he was left with continued weakness fatigue and orthostatic hypotension unable to be rehab or continue physical activity.  Some medications have been decreased or manage but he may have significant side effects of amiodarone and mexiletine combination.  The concerns at this time is that the patient may have recurrence of ventricular tachycardia and/or atrial fibrillation with the discontinuation of these medications.  Therefore we will need further consultation and discussion from electrophysiology.  This can be facilitated by discussion with [facility] electrophysiology and/or [facility] if electrophysiology who had placed him on these medications in the first place.  There is otherwise no evidence of congestive heart failure, worsening pulmonary edema, anginal symptoms.  Review of Systems: Positive for: Dizziness weakness fatigue Negative for: Vision change, hearing change,   bloody stool,  stomach pain, cough, congestion, diaphoresis, urinary frequency, urinary pain,skin lesions, skin rashes Others previously listed  Objective: Telemetry: Normal sinus rhythm   Physical Exam: Blood pressure 106/73, pulse 62, temperature 98.2 F (36.8 C), temperature source Oral, resp. rate 17, height '5\' 8"'$  (1.727 m), weight 55.4 kg, SpO2 100 %. Body mass index is 18.57 kg/m. General: Well developed, well nourished, in no acute distress. Head: Normocephalic, atraumatic, sclera non-icteric, no xanthomas, nares are without discharge. Neck: No apparent masses Lungs: Normal respirations with no wheezes, no rhonchi, no rales , no crackles   Heart: Regular rate and rhythm, normal S1 S2, no murmur, no rub, no gallop, PMI is normal size and placement, carotid upstroke normal without bruit, jugular venous pressure normal Abdomen: Soft, non-tender, non-distended with normoactive bowel sounds. No hepatosplenomegaly. Abdominal aorta is normal size without bruit Extremities: No edema, no clubbing, no cyanosis, no ulcers,  Peripheral: 2+ radial, 2+ femoral, 2+ dorsal pedal pulses Neuro: Alert and oriented. Moves all extremities spontaneously. Psych:  Responds to questions appropriately with a normal affect.   Intake/Output Summary (Last 24 hours) at 05/06/2022 1701 Last data filed at 05/06/2022 0740 Gross per 24 hour  Intake --  Output 1900 ml  Net -1900 ml    Inpatient Medications:   amiodarone  200 mg Oral Daily   apixaban  2.5 mg Oral BID   ascorbic acid  500 mg Oral Daily   aspirin  81 mg Oral Q1200   dextromethorphan-guaiFENesin  1 tablet Oral BID   feeding supplement  1 Container Oral TID BM   gabapentin  100 mg Oral QHS   magnesium oxide  400 mg Oral Daily   melatonin  10 mg Oral QHS   mexiletine  150 mg Oral BID   midodrine  10 mg Oral TID WC  mometasone-formoterol  2 puff Inhalation BID   multivitamin with minerals  1 tablet Oral Daily   pantoprazole  40 mg Oral BID    pyridostigmine  60 mg Oral Q8H   sertraline  25 mg Oral Daily   sucralfate  1 g Oral TID WC & HS   tamsulosin  0.4 mg Oral Daily   Infusions:   Labs: Recent Labs    05/04/22 0448 05/06/22 0444  NA 134* 135  K 4.5 4.5  CL 101 101  CO2 27 30  GLUCOSE 82 88  BUN 13 15  CREATININE 1.20 1.45*  CALCIUM 8.7* 8.9  MG 1.9 2.2  PHOS  --  4.1   Recent Labs    05/04/22 0448 05/06/22 0444  AST 70* 65*  ALT 85* 80*  ALKPHOS 129* 114  BILITOT 0.9 0.7  PROT 5.4* 5.4*  ALBUMIN 2.2* 2.3*   Recent Labs    05/04/22 0448 05/06/22 0444  WBC 7.4 6.9  HGB 8.7* 8.3*  HCT 27.4* 26.2*  MCV 90.1 91.3  PLT 407* 424*   No results for input(s): "CKTOTAL", "CKMB", "TROPONINI" in the last 72 hours. Invalid input(s): "POCBNP" No results for input(s): "HGBA1C" in the last 72 hours.   Weights: Filed Weights   05/04/22 0500 05/05/22 0500 05/06/22 0500  Weight: 57.4 kg 55.4 kg 55.4 kg     Radiology/Studies:  DG Chest 1 View  Result Date: 05/02/2022 CLINICAL DATA:  Dyspnea, hypoxia EXAM: CHEST  1 VIEW COMPARISON:  04/28/2022 FINDINGS: Multifocal airspace infiltrates, asymmetrically more severe throughout the left lung, appears stable since prior examination, likely infectious or inflammatory. No pneumothorax or pleural effusion. Cardiac size within normal limits. Left subclavian pacemaker defibrillator is unchanged. No acute bone abnormality. IMPRESSION: 1. Stable multifocal pulmonary infiltrate, likely infectious or inflammatory. Electronically Signed   By: Fidela Salisbury M.D.   On: 05/02/2022 04:03   DG Chest 1 View  Result Date: 04/28/2022 CLINICAL DATA:  Cough. EXAM: CHEST  1 VIEW COMPARISON:  Chest radiograph dated 04/23/2022. FINDINGS: There is diffuse interstitial and airspace densities. Slight progression of densities in the left mid to upper lung field. No pleural effusion or pneumothorax. Stable cardiac silhouette. Left pectoral AICD device. Similar appearance of a kinked right  ventricular lead. No acute osseous pathology. IMPRESSION: Diffuse interstitial and airspace densities with slight progression of densities in the left mid to upper lung field. Electronically Signed   By: Anner Crete M.D.   On: 04/28/2022 20:27   DG Chest Port 1 View  Result Date: 04/23/2022 CLINICAL DATA:  Productive cough EXAM: PORTABLE CHEST 1 VIEW COMPARISON:  04/20/2022, multiple old films dating back to 02/14/2020 FINDINGS: AICD remains in place. The right ventricular lead is kinked within the right ventricle. Heart is normal size. Mediastinal contours within normal limits. Patchy bilateral airspace disease most notable in the right mid lung and left lower lobe concerning for pneumonia. No effusions or acute bony abnormality. IMPRESSION: Patchy bilateral airspace disease concerning for pneumonia. AICD right ventricular lead kinked within the right ventricle. This is unchanged dating back to 01/03/2022. Electronically Signed   By: Rolm Baptise M.D.   On: 04/23/2022 11:33   ECHOCARDIOGRAM COMPLETE  Result Date: 04/21/2022    ECHOCARDIOGRAM REPORT   Patient Name:   ZYSHONNE MALECHA Date of Exam: 04/21/2022 Medical Rec #:  [id number]           Height:       68.0 in Accession #:    [id number]  Weight:       146.4 lb Date of Birth:  1941/05/07           BSA:          1.790 m Patient Age:    49 years            BP:           138/63 mmHg Patient Gender: M                   HR:           82 bpm. Exam Location:  [facility] Procedure: 2D Echo Indications:     CHF I50.21  History:         Patient has no prior history of Echocardiogram examinations.  Sonographer:     Kathlen Brunswick RDCS Referring Phys:  [id number] Claiborne Billings A GRIFFITH Diagnosing Phys: Yolonda Kida MD  Sonographer Comments: Technically difficult study due to poor echo windows and suboptimal apical window. The patient coughed throughout this exam, making image acquisition challenging. IMPRESSIONS  1. Left ventricular ejection fraction, by  estimation, is 30 to 35%. The left ventricle has moderately decreased function. The left ventricle demonstrates global hypokinesis. The left ventricular internal cavity size was mildly to moderately dilated. Left ventricular diastolic parameters are consistent with Grade III diastolic dysfunction (restrictive).  2. Right ventricular systolic function is low normal. The right ventricular size is mildly enlarged.  3. Left atrial size was moderately dilated.  4. Right atrial size was mild to moderately dilated.  5. The mitral valve is normal in structure. Trivial mitral valve regurgitation.  6. The aortic valve is normal in structure. Aortic valve regurgitation is not visualized. Aortic valve sclerosis/calcification is present, without any evidence of aortic stenosis. FINDINGS  Left Ventricle: Left ventricular ejection fraction, by estimation, is 30 to 35%. The left ventricle has moderately decreased function. The left ventricle demonstrates global hypokinesis. The left ventricular internal cavity size was mildly to moderately  dilated. There is no left ventricular hypertrophy. Left ventricular diastolic parameters are consistent with Grade III diastolic dysfunction (restrictive). Right Ventricle: The right ventricular size is mildly enlarged. No increase in right ventricular wall thickness. Right ventricular systolic function is low normal. Left Atrium: Left atrial size was moderately dilated. Right Atrium: Right atrial size was mild to moderately dilated. Pericardium: There is no evidence of pericardial effusion. Mitral Valve: The mitral valve is normal in structure. Trivial mitral valve regurgitation. Tricuspid Valve: The tricuspid valve is normal in structure. Tricuspid valve regurgitation is mild. Aortic Valve: The aortic valve is normal in structure. Aortic valve regurgitation is not visualized. Aortic valve sclerosis/calcification is present, without any evidence of aortic stenosis. Aortic valve peak gradient  measures 9.2 mmHg. Pulmonic Valve: The pulmonic valve was normal in structure. Pulmonic valve regurgitation is not visualized. Aorta: The ascending aorta was not well visualized. IAS/Shunts: No atrial level shunt detected by color flow Doppler. Additional Comments: A device lead is visualized.  LEFT VENTRICLE PLAX 2D LVIDd:         5.10 cm     Diastology LVIDs:         4.50 cm     LV e' medial:    5.33 cm/s LV PW:         1.00 cm     LV E/e' medial:  23.2 LV IVS:        1.00 cm     LV e' lateral:   11.16 cm/s LVOT diam:  1.80 cm     LV E/e' lateral: 11.1 LV SV:         38 LV SV Index:   21 LVOT Area:     2.54 cm  LV Volumes (MOD) LV vol d, MOD A4C: 94.7 ml LV vol s, MOD A4C: 64.0 ml LV SV MOD A4C:     94.7 ml RIGHT VENTRICLE RV Basal diam:  3.20 cm RV S prime:     11.30 cm/s TAPSE (M-mode): 2.5 cm LEFT ATRIUM           Index        RIGHT ATRIUM          Index LA diam:      5.20 cm 2.91 cm/m   RA Area:     8.76 cm LA Vol (A2C): 33.3 ml 18.60 ml/m  RA Volume:   18.30 ml 10.22 ml/m LA Vol (A4C): 43.0 ml 24.02 ml/m  AORTIC VALVE                 PULMONIC VALVE AV Area (Vmax): 1.47 cm     PV Vmax:       0.87 m/s AV Vmax:        152.00 cm/s  PV Peak grad:  3.0 mmHg AV Peak Grad:   9.2 mmHg LVOT Vmax:      87.75 cm/s LVOT Vmean:     55.800 cm/s LVOT VTI:       0.149 m  AORTA Ao Root diam: 3.10 cm MITRAL VALVE                TRICUSPID VALVE MV Area (PHT): 4.71 cm     TR Peak grad:   46.2 mmHg MV Decel Time: 161 msec     TR Vmax:        340.00 cm/s MV E velocity: 123.50 cm/s MV A velocity: 58.40 cm/s   SHUNTS MV E/A ratio:  2.11         Systemic VTI:  0.15 m                             Systemic Diam: 1.80 cm Yolonda Kida MD Electronically signed by Yolonda Kida MD Signature Date/Time: 04/21/2022/12:41:53 PM    Final    DG Chest Port 1 View  Result Date: 04/20/2022 CLINICAL DATA:  Shortness of breath EXAM: PORTABLE CHEST 1 VIEW COMPARISON:  04/10/2022 FINDINGS: Dual-chamber ICD leads from the left in  stable position. Unchanged heart size and mediastinal contours. Mild interstitial prominence but no Kerley lines, effusion, or air bronchogram. No pneumothorax. IMPRESSION: Borderline vascular congestion. Electronically Signed   By: Jorje Guild M.D.   On: 04/20/2022 06:35   US ABDOMEN LIMITED RUQ (LIVER/GB)  Result Date: 04/17/2022 CLINICAL DATA:  Abnormal LFT EXAM: ULTRASOUND ABDOMEN LIMITED RIGHT UPPER QUADRANT COMPARISON:  CT abdomen 03/27/2022, 05/10/2020 FINDINGS: Gallbladder: No gallstones or wall thickening visualized. No sonographic Murphy sign noted by sonographer. 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 Multiple septations. This cyst measures 6.1 x 5.5 x 5.4 cm and is stable from the prior CT from this year and 2021. Additional smaller cysts in the right lobe of liver better seen on CT. Portal vein is patent on color Doppler imaging with normal direction of blood flow towards the liver. Other: None. IMPRESSION: 1. The gallbladder and common bile duct are normal. 2. 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 This cyst is stable from the prior CT from this year and 2021. Electronically Signed   By: Franchot Gallo M.D.   On: 04/17/2022 15:29   CT Head Wo Contrast  Result Date: 04/17/2022 CLINICAL DATA:  Syncope EXAM: CT HEAD WITHOUT CONTRAST TECHNIQUE: Contiguous axial images were obtained from the base of the skull through the vertex without intravenous contrast. RADIATION DOSE REDUCTION: This exam was performed according to the departmental dose-optimization program which includes automated exposure control, adjustment of the mA and/or kV according to patient size and/or use of iterative reconstruction technique. COMPARISON:  CT head 03/27/2022 FINDINGS: Brain: No evidence of acute infarction, hemorrhage, hydrocephalus, extra-axial collection or mass lesion/mass effect.  Mild white matter hypodensity consistent with chronic microvascular ischemia Vascular: Negative for hyperdense vessel Skull: Negative Sinuses/Orbits: Paranasal sinuses clear. Bilateral cataract extraction Other: None IMPRESSION: No acute abnormality no interval change. Electronically Signed   By: Franchot Gallo M.D.   On: 04/17/2022 11:22   DG Chest Portable 1 View  Result Date: 04/10/2022 CLINICAL DATA:  Syncope, fall EXAM: PORTABLE CHEST 1 VIEW COMPARISON:  03/27/2022 FINDINGS: Cardiac size is within normal limits. Pacemaker/defibrillator battery is seen in left infraclavicular region. Coronary artery stents are noted. Lung fields are clear of any infiltrates or pulmonary edema. There is no pleural effusion or pneumothorax. IMPRESSION: There are no signs of pulmonary edema or focal pulmonary consolidation. Electronically Signed   By: Elmer Picker M.D.   On: 04/10/2022 15:30     Assessment and Recommendation  81 y.o. male with mild to moderate LV systolic dysfunction hypertension hyperlipidemia COPD paroxysmal nonvalvular atrial fibrillation ventricular tachycardia now having continued issues with multiple side effects of medications including mexiletine and amiodarone 1.  No further cardiac diagnostics at this time due to no evidence of acute myocardial infarction congestive heart failure 2.  Further discussion on medication management changes including discontinuation of either mexiletine and/or amiodarone due to concerns of side effects continue to exacerbate the patient's issues per above  Signed, Serafina Royals M.D. FACC

## 2022-05-06 NOTE — Progress Notes (Signed)
Progress Note   Patient: Matthew Zamora UKG:[id number] DOB: Jun 20, 1940 DOA: 04/17/2022     18 DOS: the patient was seen and examined on 05/06/2022   Brief hospital course: Matthew Zamora is a 81 y.o. male with medical history significant of hypertension, hyperlipidemia, COPD, APF on Eliquis, CHF with EF of 35-40%, CAD, anemia, CKD-3B, frequent fall, who presented on 04/17/2022 for evaluation of dizziness and lightheadedness, generalized weakness, near syncopal episodes.  This occurred in the setting of about one week of GI illness with nausea and vomiting.  In the ED, afebrile and stable vitals.   Labs notable for K 5.5, no leukocytosis, normal lactic acid x 2, AST 129, ALT 136, Tbili 1.4.  BNP mildly elevated 354.5, troponin x 2 normal, negative for Covid-19 and flu A/B.  Admitted to medicine service for further evaluation and management.    11/2: pt with positive orthostatic vitals with severe symptoms of dizziness, unable to tolerate being upright.  Remains with poor PO intake, started on IV fluids.   11/3--5: ongoing N/V, dizziness. GI and cardiology consulted.  IV fluids stopped 11/4 due to dyspnea and cxr with mild vascular congestion.  Eliquis held for EGD.  11/6: EGD showed erosive esophagitis, submucosal duodenal nodule - biopsies benign.  Eliquis resumed.  Hospital course has been prolonged and complicated by worsened acute respiratory failure with hypoxia, increased O2 requirements.  Given intermittent diuresis as BP allows.  He had multifocal pneumonia, completed course of antibiotics.  He later had E. Coli infection with diarrhea which has resolved.    Pt has persistently had positive orthostatic BP's, ongoing dizziness when upright but has overall improved.  He continues to have intermittent nausea/vomiting some days but appears in tolerating adequate PO intake.  Prognosis overall is guarded.  Patient remains high risk for clinical deterioration and readmission.         Assessment and Plan: * Near syncope Most likely due to dehydration in the setting of 1 week nausea vomiting.  Orthostatic vitals positive.  Patient appears dry on exam as well. -- Treated with IV fluids, stopped on morning of 11/4 due to dyspnea and signs of mild volume overload on labs and x-ray --Daily orthostatic vitals --Monitor on telemetry --Supportive care for GI illness as outlined  11/18--20: still +orthostatics, symptomatic, pt cannot tolerate upright position.  No improvement with TED hose and abdominal binder. --Re-consulted cardiology for possible medication adjustments.    Dyspnea Acute respiratory failure with hypoxia likely due to pneumonia and ?CHF/fluid overload. Overnight 11/3-4, pt reported feeling short of breath.  Had been on IV fluids for dehydration, orthostatic hypotension.  CXR obtained, showed borderline vascular congestions. --Supplement O2 if sats < 90% or PRN for comfort --Stopped IV fluids 11/4 --Given 20 mg IV Lasix x 1 on 11/5 due to ongoing dyspnea and coarse crackles --Monitor closely 11/7: dyspnea has improved, but now found to have PNA.  Completed antibiotics for PNA. --11/15-16: worsened dyspnea again, fluids had been resumed in setting of diarrhea and recurrent N/V.   --Fluids were stopped overnight, given neb treatment --11/16 -- Remains on 4 L O2 today, will give 20 mg IV Lasix x 1 and re-assess -- 11/17 -- Still on 4 L/min O2, will give another 20 mg IV Lasix today and re-assess -- 11/18 -- attempt to wean O2 down from 4 L given sats in upper 90's. -- 11/19 -- back on baseline 2 L/min O2  Nausea & vomiting Patient reported 1 week history of nausea vomiting at  home, leading to dehydration and near syncopal episodes with dizziness lightheadedness. Family report N/V issues frequently for several months, chronic issue. -- IV fluids have been stopped due to recurrent dyspnea --IV antiemetics as needed --Monitor renal function and  electrolytes --GI consulted, EGD done 11/6 (see erosive esophagitis)  NSVT (nonsustained ventricular tachycardia) ([place]) Central telemetry reported brief run of V-fib followed by nonsustained VT today (11/3). Patient has AICD in place. --Consult cardiology. --On amiodarone, mexiletine. --There is drug interaction with Amio increasing effects of mexiletine, side effects of mexiletine are similar patient's presenting symptoms. --Amiodarone dose reduced --Appreciate cardiology recommendations. --Monitor on telemetry. --Maintain K above 4, mag above 2  Orthostatic hypotension With PT today, patient was extremely dizzy and lightheaded.  Supine BP 109/80, seated BP 75/50 (repeat 80/63), could not tolerating standing position. --Treated IV fluids --Daily orthostatic vitals --Fall precautions --Started midodrine & dose was up-titrated to 10 mg TID WC --TED hose and Abdominal binder  11/19 -- still +orthostatic vitals and dizziness limiting ability to be upright or any mobility  Abnormal LFTs On admission AST 129, ALT 136, total bili 1.4. RUQ ultrasound showed normal gallbladder and CBD, stable complex cyst in the right lobe of liver.  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   Acute hepatitis panel negative. -- Monitor LFTs daily --GI consulted  AST/ALT improving  Hyperkalemia K was 5.5 on admission, now normalized. Maintain K>4 given hx of dysrhythmias.  CKD (chronic kidney disease), stage IIIb Baseline creatinine 1.7-1.8.  Renal function on admission was at baseline. Creatinine trended up in setting of dehydration with intractable N/V, low BP's.   Cr stable, near baseline --Treated with IV fluids --Daily BMP to monitor --Avoid nephrotoxins, hypotension --Renally dose meds  Chronic systolic CHF (congestive heart failure) ([place]) On admission, hypovolemic and dehydrated. Diuretics on hold.  Was on IV fluids. Still appears overall euvolemic, but increasing  dyspnea. 11/5 - CXR with borderline vascular congestion and BNP 764 up from 300's.  May be developing mild decompensation, given IV lasix x 1. --Hold diuretics & use PRN  (appears he takes PRN torsemide at home) --Started on midodrine, continue --Metoprolol on hold --Daily weights --Monitor closely --Cardiology following  Paroxysmal atrial fibrillation ([place]) Rate controlled. --Continue amiodarone, Mexitil --Oral metoprolol now on hold due to hypotension --Eliquis held for EGD, resumed this morning --On telemetry --Lopressor IV PRN for HR sustained above 110  Essential hypertension Hold metoprolol given soft BPs and severe orthostatic symptoms Diuretic on hold IV hydralazine as needed  Now on midodrine  Hyperlipidemia Lipitor on hold due to elevated LFTs  CAD (coronary artery disease) Stable, no active chest pain. Continue ASA, metoprolol. Lipitor on hold due to elevated LFTs.  COPD (chronic obstructive pulmonary disease) ([name]) Stable without wheezing. Continue bronchodilators  Normocytic anemia Hemoglobin stable Monitor CBC  Enteritis, enteropathogenic E. coli See Diarrhea  Diarrhea Stool studies positive for enteropathogentic E. Coli.    Diarrhea resolved. --Continue Xifaxan --Off maintenance IV fluids  Multifocal pneumonia CXR obtained today due to ongoing productive cough.  It shows bilateral opacities concerning for PNA.  Concern he maybe aspirated during intractable N/V and dry heaving.   --Completed course of Rocephin --Pulmonary hygiene --Mucinex and other supportive care PRN --Supplement O2 if needed to keep spO2>90%  Erosive esophagitis EGD on 11/6 notable for LA grade D esophagitis with no bleeding seen. --Biopsy path reviewed, benign --Continue PPI BID indefinitely --Started on Carafate --Follow up in GI clinic  Intractable nausea and vomiting Chronic x several  months. See Nausea & Vomiting, Erosive esophagitis EGD on 11/5 as above Follow  up with GI as outpatient.  Dizziness See Near Syncope  Transaminitis See abnormal LFTs  Hyponatremia Likely hypovolemic in setting of N/V and dehydration. Resolved.  Postural dizziness with near syncope Most likely due to orthostatic hypotension. Given nausea vomiting, vertigo is in the differential but feel is less likely.  Pt denies room spinning.  Generalized weakness Due to recurrent N/V x several months, unable to get adequate calories / nutrition. --PT/OT recommending SNF. --TOC following for placement. --Fall precautions.        Subjective: Patient seen with family members at bedside today.  Despite TED hose and abdominal binder, he was still orthostatic and severely dizzy today, unable to stay upright to work with PT.  He and family express concern about not ready to go to rehab bc he can't even stand up. Nausea is better today, tolerated breakfast and eating lunch during encounter.  He does not like protein drinks, but agreed with trying magic cups   Physical Exam: Vitals:   05/06/22 0700 05/06/22 0842 05/06/22 1232 05/06/22 1655  BP: (!) 110/47 (!) 112/53 96/65 106/73  Pulse: 66  64 62  Resp: 17   17  Temp: 98.1 F (36.7 C)   98.2 F (36.8 C)  TempSrc: Oral   Oral  SpO2: 97%   100%  Weight:      Height:       General exam: awake, alert, no acute distress, underweight, frail appearing HEENT: Temporal wasting, very hoarse voice with poor vocal projection, moist mucus membranes, hearing grossly normal  Respiratory system: on 2 L/min O2, normal respiratory effort. Cardiovascular system: normal S1/S2, RR, no pedal edema.   Gastrointestinal system: soft, NT, sunken abdomen Central nervous system: A&O x3. no gross focal neurologic deficits, normal speech Extremities: moves all, no edema, normal tone Psychiatry: normal mood, congruent affect, judgement and insight appear normal   Data Reviewed:  Notable labs -- Cr 1.20 >> 1.45, gap 4, albumin 2.3, AST 65, ALT  80,  iron panel with normal iron, low TIBC, mildly elevated ferritin (suspect acute phase reactant).  Hbg 8.3 from 8.7. B12 elevated 1105. Normal retic counts, low RBC's 2.85      Family Communication: Updated daughter-in-law by phone 11/17.  Patient's ex-wife ("mother of his children") and two grandchildren at bedside on rounds today 11/20.  Disposition: Status is: Inpatient Remains inpatient appropriate because: Persistently orthostatic with severe dizziness.  Needs SNF/rehab placement. Anticipate will be medically ready in 24-48 hours.    Planned Discharge Destination: Skilled nursing facility    Time spent: 35 minutes  Author: Ezekiel Slocumb, DO 05/06/2022 5:25 PM  For on call review [email or web address].

## 2022-05-07 DIAGNOSIS — I5022 Chronic systolic (congestive) heart failure: Secondary | ICD-10-CM | POA: Diagnosis not present

## 2022-05-07 DIAGNOSIS — I4729 Other ventricular tachycardia: Secondary | ICD-10-CM | POA: Diagnosis not present

## 2022-05-07 DIAGNOSIS — I951 Orthostatic hypotension: Secondary | ICD-10-CM | POA: Diagnosis not present

## 2022-05-07 DIAGNOSIS — R55 Syncope and collapse: Secondary | ICD-10-CM | POA: Diagnosis not present

## 2022-05-07 LAB — BASIC METABOLIC PANEL
Anion gap: 5 (ref 5–15)
BUN: 15 mg/dL (ref 8–23)
CO2: 30 mmol/L (ref 22–32)
Calcium: 8.8 mg/dL — ABNORMAL LOW (ref 8.9–10.3)
Chloride: 100 mmol/L (ref 98–111)
Creatinine, Ser: 1.45 mg/dL — ABNORMAL HIGH (ref 0.61–1.24)
GFR, Estimated: 49 mL/min — ABNORMAL LOW (ref >60)
Glucose, Bld: 81 mg/dL (ref 70–99)
Potassium: 4.5 mmol/L (ref 3.5–5.1)
Sodium: 135 mmol/L (ref 135–145)

## 2022-05-07 MED ORDER — PROSOURCE PLUS PO LIQD
30.0000 mL | Freq: Three times a day (TID) | ORAL | Status: DC
  Administered 2022-05-07 – 2022-05-11 (×4): 30 mL via ORAL
  Filled 2022-05-07 (×20): qty 30

## 2022-05-07 NOTE — Progress Notes (Signed)
[facility] Encounter Note  Patient: Matthew Zamora / Admit Date: 04/17/2022 / Date of Encounter: 05/07/2022, 4:23 PM   Subjective: Patient continues to be significantly weak from multiple reasons.  He has had made some progress with physical therapy over the last 2 days.  He has been sitting in the chair as well as eating his meals without evidence of significant event, hypotension, severe dizziness, nausea and/or vomiting.  He did have a slightly blood in his nose which did cause him to have some nausea for short period of time.  After review of his hospital course, the patient has been very ill and bedridden for almost 20 days.  Therefore his orthostatic hypotension potentially can be from this issue alone.  Surely, medication management is exacerbating.  Unfortunately, adjustments of his mexiletine and amiodarone have potential consequences of recurrent ventricular tachycardia which has been relatively stable since his entire hospital course.  Review of Systems: Positive for: Weakness Negative for: Vision change, hearing change, syncope, dizziness, nausea, vomiting,diarrhea, bloody stool, stomach pain, cough, congestion, diaphoresis, urinary frequency, urinary pain,skin lesions, skin rashes Others previously listed  Objective: Telemetry: Normal sinus rhythm Physical Exam: Blood pressure (!) 101/56, pulse 65, temperature 97.7 F (36.5 C), resp. rate 18, height '5\' 8"'$  (1.727 m), weight 53 kg, SpO2 100 %. Body mass index is 17.77 kg/m. General: Well developed, well nourished, in no acute distress. Head: Normocephalic, atraumatic, sclera non-icteric, no xanthomas, nares are without discharge. Neck: No apparent masses Lungs: Normal respirations with no wheezes, no rhonchi, no rales , no crackles   Heart: Regular rate and rhythm, normal S1 S2, no murmur, no rub, no gallop, PMI is normal size and placement, carotid upstroke normal without bruit, jugular venous pressure  normal Abdomen: Soft, non-tender, non-distended with normoactive bowel sounds. No hepatosplenomegaly. Abdominal aorta is normal size without bruit Extremities: No edema, no clubbing, no cyanosis, no ulcers,  Peripheral: 2+ radial, 2+ femoral, 2+ dorsal pedal pulses Neuro: Alert and oriented. Moves all extremities spontaneously. Psych:  Responds to questions appropriately with a normal affect.   Intake/Output Summary (Last 24 hours) at 05/07/2022 1623 Last data filed at 05/07/2022 0941 Gross per 24 hour  Intake --  Output 500 ml  Net -500 ml    Inpatient Medications:   (feeding supplement) PROSource Plus  30 mL Oral TID BM   apixaban  2.5 mg Oral BID   ascorbic acid  500 mg Oral Daily   aspirin  81 mg Oral Q1200   dextromethorphan-guaiFENesin  1 tablet Oral BID   feeding supplement  1 Container Oral TID BM   gabapentin  100 mg Oral QHS   magnesium oxide  400 mg Oral Daily   melatonin  10 mg Oral QHS   midodrine  10 mg Oral TID WC   mometasone-formoterol  2 puff Inhalation BID   multivitamin with minerals  1 tablet Oral Daily   pantoprazole  40 mg Oral BID   pyridostigmine  60 mg Oral Q8H   sertraline  25 mg Oral Daily   sucralfate  1 g Oral TID WC & HS   tamsulosin  0.4 mg Oral Daily   Infusions:   Labs: Recent Labs    05/06/22 0444 05/07/22 0506  NA 135 135  K 4.5 4.5  CL 101 100  CO2 30 30  GLUCOSE 88 81  BUN 15 15  CREATININE 1.45* 1.45*  CALCIUM 8.9 8.8*  MG 2.2  --   PHOS 4.1  --  Recent Labs    05/06/22 0444  AST 65*  ALT 80*  ALKPHOS 114  BILITOT 0.7  PROT 5.4*  ALBUMIN 2.3*   Recent Labs    05/06/22 0444  WBC 6.9  HGB 8.3*  HCT 26.2*  MCV 91.3  PLT 424*   No results for input(s): "CKTOTAL", "CKMB", "TROPONINI" in the last 72 hours. Invalid input(s): "POCBNP" No results for input(s): "HGBA1C" in the last 72 hours.   Weights: Filed Weights   05/05/22 0500 05/06/22 0500 05/07/22 0706  Weight: 55.4 kg 55.4 kg 53 kg      Radiology/Studies:  DG Chest 1 View  Result Date: 05/02/2022 CLINICAL DATA:  Dyspnea, hypoxia EXAM: CHEST  1 VIEW COMPARISON:  04/28/2022 FINDINGS: Multifocal airspace infiltrates, asymmetrically more severe throughout the left lung, appears stable since prior examination, likely infectious or inflammatory. No pneumothorax or pleural effusion. Cardiac size within normal limits. Left subclavian pacemaker defibrillator is unchanged. No acute bone abnormality. IMPRESSION: 1. Stable multifocal pulmonary infiltrate, likely infectious or inflammatory. Electronically Signed   By: Fidela Salisbury M.D.   On: 05/02/2022 04:03   DG Chest 1 View  Result Date: 04/28/2022 CLINICAL DATA:  Cough. EXAM: CHEST  1 VIEW COMPARISON:  Chest radiograph dated 04/23/2022. FINDINGS: There is diffuse interstitial and airspace densities. Slight progression of densities in the left mid to upper lung field. No pleural effusion or pneumothorax. Stable cardiac silhouette. Left pectoral AICD device. Similar appearance of a kinked right ventricular lead. No acute osseous pathology. IMPRESSION: Diffuse interstitial and airspace densities with slight progression of densities in the left mid to upper lung field. Electronically Signed   By: Anner Crete M.D.   On: 04/28/2022 20:27   DG Chest Port 1 View  Result Date: 04/23/2022 CLINICAL DATA:  Productive cough EXAM: PORTABLE CHEST 1 VIEW COMPARISON:  04/20/2022, multiple old films dating back to 02/14/2020 FINDINGS: AICD remains in place. The right ventricular lead is kinked within the right ventricle. Heart is normal size. Mediastinal contours within normal limits. Patchy bilateral airspace disease most notable in the right mid lung and left lower lobe concerning for pneumonia. No effusions or acute bony abnormality. IMPRESSION: Patchy bilateral airspace disease concerning for pneumonia. AICD right ventricular lead kinked within the right ventricle. This is unchanged dating back  to 01/03/2022. Electronically Signed   By: Rolm Baptise M.D.   On: 04/23/2022 11:33   ECHOCARDIOGRAM COMPLETE  Result Date: 04/21/2022    ECHOCARDIOGRAM REPORT   Patient Name:   Matthew Zamora Date of Exam: 04/21/2022 Medical Rec #:  [id number]           Height:       68.0 in Accession #:    [id number]          Weight:       146.4 lb Date of Birth:  27-Sep-1940           BSA:          1.790 m Patient Age:    28 years            BP:           138/63 mmHg Patient Gender: M                   HR:           82 bpm. Exam Location:  [facility] Procedure: 2D Echo Indications:     CHF I50.21  History:         Patient  has no prior history of Echocardiogram examinations.  Sonographer:     Kathlen Brunswick RDCS Referring Phys:  [id number] Claiborne Billings A GRIFFITH Diagnosing Phys: Yolonda Kida MD  Sonographer Comments: Technically difficult study due to poor echo windows and suboptimal apical window. The patient coughed throughout this exam, making image acquisition challenging. IMPRESSIONS  1. Left ventricular ejection fraction, by estimation, is 30 to 35%. The left ventricle has moderately decreased function. The left ventricle demonstrates global hypokinesis. The left ventricular internal cavity size was mildly to moderately dilated. Left ventricular diastolic parameters are consistent with Grade III diastolic dysfunction (restrictive).  2. Right ventricular systolic function is low normal. The right ventricular size is mildly enlarged.  3. Left atrial size was moderately dilated.  4. Right atrial size was mild to moderately dilated.  5. The mitral valve is normal in structure. Trivial mitral valve regurgitation.  6. The aortic valve is normal in structure. Aortic valve regurgitation is not visualized. Aortic valve sclerosis/calcification is present, without any evidence of aortic stenosis. FINDINGS  Left Ventricle: Left ventricular ejection fraction, by estimation, is 30 to 35%. The left ventricle has moderately decreased  function. The left ventricle demonstrates global hypokinesis. The left ventricular internal cavity size was mildly to moderately  dilated. There is no left ventricular hypertrophy. Left ventricular diastolic parameters are consistent with Grade III diastolic dysfunction (restrictive). Right Ventricle: The right ventricular size is mildly enlarged. No increase in right ventricular wall thickness. Right ventricular systolic function is low normal. Left Atrium: Left atrial size was moderately dilated. Right Atrium: Right atrial size was mild to moderately dilated. Pericardium: There is no evidence of pericardial effusion. Mitral Valve: The mitral valve is normal in structure. Trivial mitral valve regurgitation. Tricuspid Valve: The tricuspid valve is normal in structure. Tricuspid valve regurgitation is mild. Aortic Valve: The aortic valve is normal in structure. Aortic valve regurgitation is not visualized. Aortic valve sclerosis/calcification is present, without any evidence of aortic stenosis. Aortic valve peak gradient measures 9.2 mmHg. Pulmonic Valve: The pulmonic valve was normal in structure. Pulmonic valve regurgitation is not visualized. Aorta: The ascending aorta was not well visualized. IAS/Shunts: No atrial level shunt detected by color flow Doppler. Additional Comments: A device lead is visualized.  LEFT VENTRICLE PLAX 2D LVIDd:         5.10 cm     Diastology LVIDs:         4.50 cm     LV e' medial:    5.33 cm/s LV PW:         1.00 cm     LV E/e' medial:  23.2 LV IVS:        1.00 cm     LV e' lateral:   11.16 cm/s LVOT diam:     1.80 cm     LV E/e' lateral: 11.1 LV SV:         38 LV SV Index:   21 LVOT Area:     2.54 cm  LV Volumes (MOD) LV vol d, MOD A4C: 94.7 ml LV vol s, MOD A4C: 64.0 ml LV SV MOD A4C:     94.7 ml RIGHT VENTRICLE RV Basal diam:  3.20 cm RV S prime:     11.30 cm/s TAPSE (M-mode): 2.5 cm LEFT ATRIUM           Index        RIGHT ATRIUM          Index LA diam:      5.20  cm 2.91 cm/m   RA  Area:     8.76 cm LA Vol (A2C): 33.3 ml 18.60 ml/m  RA Volume:   18.30 ml 10.22 ml/m LA Vol (A4C): 43.0 ml 24.02 ml/m  AORTIC VALVE                 PULMONIC VALVE AV Area (Vmax): 1.47 cm     PV Vmax:       0.87 m/s AV Vmax:        152.00 cm/s  PV Peak grad:  3.0 mmHg AV Peak Grad:   9.2 mmHg LVOT Vmax:      87.75 cm/s LVOT Vmean:     55.800 cm/s LVOT VTI:       0.149 m  AORTA Ao Root diam: 3.10 cm MITRAL VALVE                TRICUSPID VALVE MV Area (PHT): 4.71 cm     TR Peak grad:   46.2 mmHg MV Decel Time: 161 msec     TR Vmax:        340.00 cm/s MV E velocity: 123.50 cm/s MV A velocity: 58.40 cm/s   SHUNTS MV E/A ratio:  2.11         Systemic VTI:  0.15 m                             Systemic Diam: 1.80 cm Yolonda Kida MD Electronically signed by Yolonda Kida MD Signature Date/Time: 04/21/2022/12:41:53 PM    Final    DG Chest Port 1 View  Result Date: 04/20/2022 CLINICAL DATA:  Shortness of breath EXAM: PORTABLE CHEST 1 VIEW COMPARISON:  04/10/2022 FINDINGS: Dual-chamber ICD leads from the left in stable position. Unchanged heart size and mediastinal contours. Mild interstitial prominence but no Kerley lines, effusion, or air bronchogram. No pneumothorax. IMPRESSION: Borderline vascular congestion. Electronically Signed   By: Jorje Guild M.D.   On: 04/20/2022 06:35   US ABDOMEN LIMITED RUQ (LIVER/GB)  Result Date: 04/17/2022 CLINICAL DATA:  Abnormal LFT EXAM: ULTRASOUND ABDOMEN LIMITED RIGHT UPPER QUADRANT COMPARISON:  CT abdomen 03/27/2022, 05/10/2020 FINDINGS: Gallbladder: No gallstones or wall thickening visualized. No sonographic Murphy sign noted by sonographer. 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 Multiple septations. This cyst measures 6.1 x 5.5 x 5.4 cm and is stable from the prior CT from this year and 2021. Additional smaller cysts in the right lobe of liver better seen on CT. Portal vein is patent on color  Doppler imaging with normal direction of blood flow towards the liver. Other: None. IMPRESSION: 1. The gallbladder and common bile duct are normal. 2. 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 This cyst is stable from the prior CT from this year and 2021. Electronically Signed   By: Franchot Gallo M.D.   On: 04/17/2022 15:29   CT Head Wo Contrast  Result Date: 04/17/2022 CLINICAL DATA:  Syncope EXAM: CT HEAD WITHOUT CONTRAST TECHNIQUE: Contiguous axial images were obtained from the base of the skull through the vertex without intravenous contrast. RADIATION DOSE REDUCTION: This exam was performed according to the departmental dose-optimization program which includes automated exposure control, adjustment of the mA and/or kV according to patient size and/or use of iterative reconstruction technique. COMPARISON:  CT head 03/27/2022 FINDINGS: Brain:  No evidence of acute infarction, hemorrhage, hydrocephalus, extra-axial collection or mass lesion/mass effect. Mild white matter hypodensity consistent with chronic microvascular ischemia Vascular: Negative for hyperdense vessel Skull: Negative Sinuses/Orbits: Paranasal sinuses clear. Bilateral cataract extraction Other: None IMPRESSION: No acute abnormality no interval change. Electronically Signed   By: Franchot Gallo M.D.   On: 04/17/2022 11:22   DG Chest Portable 1 View  Result Date: 04/10/2022 CLINICAL DATA:  Syncope, fall EXAM: PORTABLE CHEST 1 VIEW COMPARISON:  03/27/2022 FINDINGS: Cardiac size is within normal limits. Pacemaker/defibrillator battery is seen in left infraclavicular region. Coronary artery stents are noted. Lung fields are clear of any infiltrates or pulmonary edema. There is no pleural effusion or pneumothorax. IMPRESSION: There are no signs of pulmonary edema or focal pulmonary consolidation. Electronically Signed   By: Elmer Picker M.D.   On: 04/10/2022 15:30      Assessment and Recommendation  82 y.o. male with known moderate dilated cardiomyopathy ventricular tachycardia nausea vomiting orthostatic hypotension status post long hospital stay with minimal ambulation now causing dizziness weakness but slightly improved with continued physical therapy 1.  Continuation of physical therapy which appears to be working for the patient and slow improvements 2.  No cardiovascular diagnostics necessary at this time 3.  No further adjustments of medication management at this time due to slow improvements 4.  Further considerations of changes based on physical therapy and pressure support tomorrow  Signed, Serafina Royals M.D. FACC

## 2022-05-07 NOTE — Progress Notes (Addendum)
Progress Note   Patient: Matthew Zamora LGX:[id number] DOB: February 03, 1941 DOA: 04/17/2022     19 DOS: the patient was seen and examined on 05/07/2022   Brief hospital course: Matthew Zamora is a 81 y.o. male with medical history significant of hypertension, hyperlipidemia, COPD, APF on Eliquis, CHF with EF of 35-40%, CAD, anemia, CKD-3B, frequent fall, who presented on 04/17/2022 for evaluation of dizziness and lightheadedness, generalized weakness, near syncopal episodes.  This occurred in the setting of about one week of GI illness with nausea and vomiting.  In the ED, afebrile and stable vitals.   Labs notable for K 5.5, no leukocytosis, normal lactic acid x 2, AST 129, ALT 136, Tbili 1.4.  BNP mildly elevated 354.5, troponin x 2 normal, negative for Covid-19 and flu A/B.  Admitted to medicine service for further evaluation and management.    11/2: pt with positive orthostatic vitals with severe symptoms of dizziness, unable to tolerate being upright.  Remains with poor PO intake, started on IV fluids.  11/3--5: ongoing N/V, dizziness. GI and cardiology consulted.  IV fluids stopped 11/4 due to dyspnea and cxr with mild vascular congestion.  Eliquis held for EGD. 11/6: EGD showed erosive esophagitis, submucosal duodenal nodule - biopsies benign.  Eliquis resumed.  Hospital course has been prolonged and complicated by worsened acute respiratory failure with hypoxia, increased O2 requirements.  Given intermittent diuresis as BP allows.  He had multifocal pneumonia, completed course of antibiotics.  He later had E. Coli infection with diarrhea which has resolved.    Pt has persistently had positive orthostatic BP's, ongoing dizziness when upright.  He has been unable to participate in PT.    He continues to have intermittent nausea/vomiting some days but appears in tolerating adequate PO intake.  Prognosis overall is guarded.  Patient remains high risk for clinical deterioration and  readmission.        Assessment and Plan: * Near syncope Persistent orthostatic hypotension Initially attributed to dehydration in the setting of 1 week nausea vomiting.  Orthostatic vitals were positive and have remained so despite fluids, midodrine, TED hose, binder.  -- Treated with IV fluids initially due to profound dehydration.  Off fluids and has required intermittent diuresis --Daily orthostatic vitals - remain positive --Monitor on telemetry --Supportive care for GI illness as outlined  11/18--21: still +orthostatics, symptomatic, pt cannot tolerate upright position.  No improvement with TED hose and abdominal binder. --Re-consulted [facility] cardiology for possible medication adjustments.  They recommended getting EP's input --Discussed case with Dr. Caryl Comes today (11/21).  Concern for autonomic failure given heart rate does not compensate for orthostatic drop in blood pressure.  He recommended stopping amiodarone and mexiletine.  Indicated midodrine can be used above max dose and sometimes used as high as 20 mg QID.   Also recommended maintaining patient in reverse Trendelenburg (feet down) to encourage it autonomic nervous system to compensate for gravity on a more regular basis.  Abdominal binder only when upright, thigh sleeves likely more helpful than TED hose on the distal lower extremities.  Fludrocortisone is a poor option in this heart failure patient who is very fluid sensitive requiring IV diuresis at times.  Pyridostigmine only works when vertical and upright, can be continued.  Declined encouraged to reach out if any further questions. -- Discussed above with Audelia Acton (daughter-in-law) who requested I reach out to Dr. Luana Shu his EP at [facility].  I left a voicemail with summary of the case and my cell phone number and  am awaiting callback.  Dyspnea Acute respiratory failure with hypoxia likely due to pneumonia and CHF/fluid overload. Pneumonia has since resolved. --Supplement O2 if sats <  90% or PRN for comfort -- Has required intermittent gentle diuresis, very fluid sensitive --Currently stable on 2 L/min which appears baseline  Nausea & vomiting Patient reported 1 week history of nausea vomiting at home, leading to dehydration and near syncopal episodes with dizziness lightheadedness. Family report N/V issues frequently for several months, chronic issue. -- IV fluids have been stopped due to recurrent dyspnea --IV antiemetics as needed --Monitor renal function and electrolytes --GI consulted, EGD done 11/6 (see erosive esophagitis)  NSVT (nonsustained ventricular tachycardia) ([place]) Patient has AICD in place. --[facility] cardiology, they now recommend EP input with [facility].  See Near Syncope. --Has been on amiodarone, mexiletine.   --There is drug interaction with Amio increasing effects of mexiletine, side effects of mexiletine are similar patient's presenting symptoms. 11/21 EP Dr. Caryl Comes recommended stopping these as the combination would explain patient's persistent symptoms, currently d/c'd orders for these.  Awaiting call back from EP at [facility], Dr. Luana Shu at family's request. --Monitor on telemetry. --Maintain K above 4, mag above 2  Orthostatic hypotension Persistent.  Continues to be extremely dizzy and lightheaded whenever in upright position. See orthostatic vitals in PT notes --Daily orthostatic vitals --Fall precautions --Started midodrine & dose was up-titrated to 10 mg TID WC (can consider increasing above max dose, as much as 20 mg) --TED hose and Abdominal binder when upright --Try reverse trendelenburg bed position (at all times or at least for awhile prior to attempting upright)  Abnormal LFTs On admission AST 129, ALT 136, total bili 1.4. RUQ ultrasound showed normal gallbladder and CBD, stable complex cyst in the right lobe of liver.  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   Acute hepatitis panel negative. -- Monitor LFTs  daily --GI consulted, signed off AST/ALT improving  Hyperkalemia K was 5.5 on admission, now normalized. Maintain K>4 given hx of dysrhythmias.  CKD (chronic kidney disease), stage IIIb Baseline creatinine 1.7-1.8.  Renal function on admission was at baseline. Creatinine trended up in setting of dehydration with intractable N/V, low BP's.   Cr stable, near baseline --Treated with IV fluids --Daily BMP to monitor --Avoid nephrotoxins, hypotension --Renally dose meds  Chronic systolic CHF (congestive heart failure) ([place]) On admission, hypovolemic and dehydrated. Diuretics on hold.  Was on IV fluids due to profound dehydration.  He developed worsening dyspnea and BNP was elevated, fluids were stopped and he was gently diuresed. 11/15--21: Gave IV Lasix a couple times this week due to increased O2 requirement up to 4 L after IV fluids were used in the setting of persistent diarrhea and ongoing nausea vomiting.  GI symptoms have improved and patient is tolerating p.o. intake fairly well.  Now stable on 2 L O2 -- Intermittent diuresis as needed (appears he takes PRN torsemide at home) --Started on midodrine, continue --Metoprolol discontinued due to soft BP and severe orthostasis --Daily weights --Monitor closely -- [facility] cardiology was following.  Requested they revisit the case 11/20, recommended reaching out to [facility] EP for recommendations on antiarrhythmics  Paroxysmal atrial fibrillation ([place]) Rate controlled. --Holding amiodarone, Mexitil after EP discussion as outlined. --Oral metoprolol stopped due to low BP's --On Eliquis --Monitor on telemetry --Lopressor IV PRN for HR sustained above 110  Essential hypertension Hold metoprolol given soft BPs and severe orthostatic symptoms Diuretic on hold IV hydralazine as needed  Now on  midodrine  Hyperlipidemia Lipitor on hold due to elevated LFTs  CAD (coronary artery disease) Stable, no active chest pain. Continue ASA,  metoprolol. Lipitor on hold due to elevated LFTs.  COPD (chronic obstructive pulmonary disease) ([name]) Stable without wheezing. Continue bronchodilators  Normocytic anemia Hemoglobin stable Monitor CBC  Enteritis, enteropathogenic E. coli See Diarrhea  Diarrhea Stool studies positive for enteropathogentic E. Coli.  Treated with Xifaxan.  Diarrhea resolved.  Multifocal pneumonia CXR obtained today due to ongoing productive cough.  It shows bilateral opacities concerning for PNA.  Concern he maybe aspirated during intractable N/V and dry heaving.   --Completed course of Rocephin --Pulmonary hygiene --Mucinex and other supportive care PRN --Supplement O2 if needed to keep spO2>90%  Erosive esophagitis EGD on 11/6 notable for LA grade D esophagitis with no bleeding seen. --Biopsy path reviewed, benign --Continue PPI BID indefinitely --Started on Carafate --Follow up in GI clinic  Intractable nausea and vomiting Chronic x several months. See Nausea & Vomiting, Erosive esophagitis EGD on 11/5 as above Follow up with GI as outpatient.  Dizziness See Near Syncope  Transaminitis See abnormal LFTs  Hyponatremia Likely hypovolemic in setting of N/V and dehydration. Resolved.  Postural dizziness with near syncope As outlined.  Generalized weakness Due to recurrent N/V x several months, unable to get adequate calories / nutrition. --PT/OT recommending SNF. --TOC following for placement. --Fall precautions.        Subjective: Patient seen awake sitting up in bed.  Again could not tolerate upright with PT and BP dropped, had to return to bed.  N/V is better today and yesterday, still an intermittent problem but has been tolerating meals.  Wants to regain some weight.  Remains motivated but frustrated by persistent dizziness. Says he just wants to be back to walking around again.    Physical Exam: Vitals:   05/07/22 0635 05/07/22 0706 05/07/22 0919 05/07/22 1645  BP:  (!) 118/50  (!) 101/56 (!) 102/49  Pulse: 62  65 (!) 59  Resp: '18  18 16  '$ Temp: 97.6 F (36.4 C)  97.7 F (36.5 C) 97.6 F (36.4 C)  TempSrc:      SpO2: 100%  100% 100%  Weight:  53 kg    Height:       General exam: awake, alert, no acute distress, underweight, frail appearing HEENT: Temporal wasting, very hoarse voice with poor vocal projection, moist mucus membranes, hearing grossly normal  Respiratory system: on 2 L/min O2, normal respiratory effort. Cardiovascular system: normal S1/S2, RR, no pedal edema.   Gastrointestinal system: soft, NT, sunken abdomen Central nervous system: A&O x3. no gross focal neurologic deficits, normal speech Extremities: moves all, no edema, normal tone Psychiatry: normal mood, congruent affect, judgement and insight appear normal   Data Reviewed:  Notable labs -- Cr 1.20 >> 1.45, gap 4, albumin 2.3, AST 65, ALT 80,  iron panel with normal iron, low TIBC, mildly elevated ferritin (suspect acute phase reactant).  Hbg 8.3 from 8.7. B12 elevated 1105. Normal retic counts, low RBC's 2.85      Family Communication: Updated daughter-in-law by phone 11/17.  Patient's ex-wife ("mother of his children") and two grandchildren at bedside on rounds today 11/20.  Disposition: Status is: Inpatient Remains inpatient appropriate because: Persistently orthostatic with severe dizziness.  Needs SNF/rehab placement. Anticipate will be medically ready in 24-48 hours.    Planned Discharge Destination: Skilled nursing facility    Time spent: 55 minutes including time spent at bedside and in coordination of care.  Author: Ezekiel Slocumb, DO 05/07/2022 5:35 PM  For on call review [email or web address].

## 2022-05-07 NOTE — Progress Notes (Signed)
Physical Therapy Treatment Patient Details Name: Matthew Zamora MRN: [id number] DOB: [birth year] Today's Date: 05/07/2022   History of Present Illness Pt is an 81 year old male presenting to ED with lightheadedness; PMH significant for  hypertension, hyperlipidemia, COPD, APF on Eliquis, CHF with EF of 35-40%, CAD, anemia, CKD-3B, frequent fall    PT Comments    Pt received supine in bed agreeable to PT. Able to safely transfer from bed to recliner with RW with step pivot transfer. Pt remains symptomatic and positive for orthostatics despite ted hose, tightening of abdominal binder, and attempting LE therex prior to transfers to elevate HR and BP. Unable to receive 3 minute standing data due to dizziness. Pt remains unsafe to trial gait due to symptoms thus warranting d/c to STR. Pt in recliner with education on upright posture to improve tolerance. All needs in reach.  Orthostatic VS for the past 24 hrs:  BP- Lying Pulse- Lying BP- Sitting Pulse- Sitting BP- Standing at 0 minutes Pulse- Standing at 0 minutes  05/07/22 1135 112/59 59 (!) 80/51 72 (!) 73/42 73  05/06/22 1245 109/56 61 (!) 76/48 68 (!) 67/39 83        Recommendations for follow up therapy are one component of a multi-disciplinary discharge planning process, led by the attending physician.  Recommendations may be updated based on patient status, additional functional criteria and insurance authorization.  Follow Up Recommendations  Skilled nursing-short term rehab (<3 hours/day) Can patient physically be transported by private vehicle: No   Assistance Recommended at Discharge Intermittent Supervision/Assistance  Patient can return home with the following A little help with walking and/or transfers;A little help with bathing/dressing/bathroom;Assist for transportation;Help with stairs or ramp for entrance;Assistance with cooking/housework   Equipment Recommendations  None recommended by PT    Recommendations for Other  Services       Precautions / Restrictions Precautions Precautions: Fall Precaution Comments: orthostatic Restrictions Weight Bearing Restrictions: No     Mobility  Bed Mobility Overal bed mobility: Modified Independent Bed Mobility: Supine to Sit, Sit to Supine     Supine to sit: Modified independent (Device/Increase time) Sit to supine: Modified independent (Device/Increase time)     Patient Response: Cooperative  Transfers Overall transfer level: Needs assistance Equipment used: Rolling walker (2 wheels) Transfers: Sit to/from Stand Sit to Stand: Supervision   Step pivot transfers: Min guard            Ambulation/Gait               General Gait Details: unable due to dizziness   Stairs             Wheelchair Mobility    Modified Rankin (Stroke Patients Only)       Balance Overall balance assessment: History of Falls, Needs assistance Sitting-balance support: Feet supported, Bilateral upper extremity supported Sitting balance-Leahy Scale: Fair       Standing balance-Leahy Scale: Poor Standing balance comment: Initially fair but as pt stands prolonged becomes dizzy                            Cognition Arousal/Alertness: Awake/alert Behavior During Therapy: WFL for tasks assessed/performed Overall Cognitive Status: Within Functional Limits for tasks assessed  Exercises General Exercises - Lower Extremity Ankle Circles/Pumps: AROM, Strengthening, Both, 10 reps, Supine Short Arc Quad: AROM, Strengthening, Both, 10 reps, Supine Hip ABduction/ADduction: AROM, Strengthening, Both, 10 reps, Supine Straight Leg Raises: AROM, Strengthening, Both, 10 reps, Supine Other Exercises Other Exercises: Importance of tolerating upright positions for improved tolerance for OOB activity    General Comments General comments (skin integrity, edema, etc.): positive orthostatics  despite ted hose and abdominal binder      Pertinent Vitals/Pain Pain Assessment Pain Assessment: No/denies pain    Home Living                          Prior Function            PT Goals (current goals can now be found in the care plan section) Acute Rehab PT Goals Patient Stated Goal: to return home PT Goal Formulation: With patient Time For Goal Achievement: 05/16/22 Potential to Achieve Goals: Fair Progress towards PT goals: Not progressing toward goals - comment (consistent orthostasis)    Frequency    Min 2X/week      PT Plan Current plan remains appropriate    Co-evaluation              AM-PAC PT "6 Clicks" Mobility   Outcome Measure  Help needed turning from your back to your side while in a flat bed without using bedrails?: None Help needed moving from lying on your back to sitting on the side of a flat bed without using bedrails?: None Help needed moving to and from a bed to a chair (including a wheelchair)?: A Little Help needed standing up from a chair using your arms (e.g., wheelchair or bedside chair)?: A Little Help needed to walk in hospital room?: Total Help needed climbing 3-5 steps with a railing? : Total 6 Click Score: 16    End of Session Equipment Utilized During Treatment: Gait belt;Oxygen Activity Tolerance: Patient tolerated treatment well;Treatment limited secondary to medical complications (Comment) Patient left: in chair;with call bell/phone within reach;with chair alarm set Nurse Communication: Mobility status PT Visit Diagnosis: Unsteadiness on feet (R26.81);Muscle weakness (generalized) (M62.81);History of falling (Z91.81);Difficulty in walking, not elsewhere classified (R26.2)     Time: 6811-5726 PT Time Calculation (min) (ACUTE ONLY): 24 min  Charges:  $Therapeutic Exercise: 8-22 mins $Therapeutic Activity: 8-22 mins                    Amayah Staheli M. Fairly IV, PT, DPT Physical Therapist- [facility]  05/07/2022, 11:41 AM

## 2022-05-07 NOTE — Progress Notes (Signed)
Initial Nutrition Assessment  DOCUMENTATION CODES:   Severe malnutrition in context of chronic illness, Underweight  INTERVENTION:   -Liberalize diet to regular for widest variety of meal selections -MVI with minerals daily -Boost Breeze po TID, each supplement provides 250 kcal and 9 grams of protein  -30 ml Prosource Plus TID, each supplement provides 100 kcals and 15 grams protein -Magic cup TID with meals, each supplement provides 290 kcal and 9 grams of protein   NUTRITION DIAGNOSIS:   Severe Malnutrition related to chronic illness (CHF) as evidenced by severe fat depletion, severe muscle depletion, percent weight loss.  GOAL:   Patient will meet greater than or equal to 90% of their needs  MONITOR:   PO intake, Supplement acceptance  REASON FOR ASSESSMENT:   Consult Assessment of nutrition requirement/status  ASSESSMENT:   Pt with medical history significant of hypertension, hyperlipidemia, COPD, APF on Eliquis, CHF with EF of 35-40%, CAD, anemia, CKD-3B, frequent fall, who presented on 04/17/2022 for evaluation of dizziness and lightheadedness, generalized weakness, near syncopal episodes.  Pt admitted with near syncope.    Reviewed I/O's: -800 ml x 24 hours and -6.5 L since 04/23/22  UOP: 800 ml x 24 hours  Spoke with pt, who was sitting in recliner chair at time of visit. Pt reports intake has improved over the past few days, but has mainly been eating the same things (grilled chicken and rice). Pt shares he thinks he can find things that he likes to eat off of the menu. Pt shares that he has had poor appetite for the past 3-4 weeks. He reports that he had a hard time keeping anything down for about 1 weeks PTA and was on a liquid diet, as that was all he could tolerate. He estimates he was only able to eat solid food starting the past week. Pt does not like Ensure and has been sipping on [employer]. Pt is amenable to try Magic Cups.   Per pt, his UBW is  around 150-160#, which he last weighed about a month ago prior to illness. He estimates he has lost about 25-30# over the past month. Reviewed wt hx; pt has experienced a 16.5% wt loss over the past month, which is significant for time frame.   Discussed importance of good meal and supplement intake to promote healing. RD obtained pt lunch order and reviewed menu with pt.   Per TOC notes, plan for SNF placement once medically stable.   Medications reviewed and include vitamin C, magnesium oxide, and melatonin.   Labs reviewed.  NUTRITION - FOCUSED PHYSICAL EXAM:  Flowsheet Row Most Recent Value  Orbital Region Severe depletion  Upper Arm Region Severe depletion  Thoracic and Lumbar Region Severe depletion  Buccal Region Severe depletion  Temple Region Severe depletion  Clavicle Bone Region Severe depletion  Clavicle and Acromion Bone Region Severe depletion  Scapular Bone Region Severe depletion  Dorsal Hand Severe depletion  Patellar Region Severe depletion  Anterior Thigh Region Severe depletion  Posterior Calf Region Severe depletion  Edema (RD Assessment) None  Hair Reviewed  Eyes Reviewed  Mouth Reviewed  Skin Reviewed  Nails Reviewed       Diet Order:   Diet Order             Diet regular Room service appropriate? Yes; Fluid consistency: Thin  Diet effective now                   EDUCATION NEEDS:  Education needs have been addressed  Skin:  Skin Assessment: Reviewed RN Assessment  Last BM:  05/05/22  Height:   Ht Readings from Last 1 Encounters:  04/17/22 '5\' 8"'$  (1.727 m)    Weight:   Wt Readings from Last 1 Encounters:  05/07/22 53 kg    Ideal Body Weight:  63.6 kg  BMI:  Body mass index is 17.77 kg/m.  Estimated Nutritional Needs:   Kcal:  1850-2050  Protein:  105-120 grams  Fluid:  > 1.8 L    Loistine Chance, RD, LDN, [place] Registered Dietitian II Certified Diabetes Care and Education Specialist Please refer to [facility] for RD  and/or RD on-call/weekend/after hours pager

## 2022-05-08 DIAGNOSIS — I951 Orthostatic hypotension: Secondary | ICD-10-CM | POA: Diagnosis not present

## 2022-05-08 DIAGNOSIS — R0601 Orthopnea: Secondary | ICD-10-CM | POA: Diagnosis not present

## 2022-05-08 DIAGNOSIS — I4729 Other ventricular tachycardia: Secondary | ICD-10-CM | POA: Diagnosis not present

## 2022-05-08 DIAGNOSIS — R55 Syncope and collapse: Secondary | ICD-10-CM | POA: Diagnosis not present

## 2022-05-08 DIAGNOSIS — E43 Unspecified severe protein-calorie malnutrition: Secondary | ICD-10-CM | POA: Diagnosis present

## 2022-05-08 LAB — COMPREHENSIVE METABOLIC PANEL
ALT: 66 U/L — ABNORMAL HIGH (ref 0–44)
AST: 55 U/L — ABNORMAL HIGH (ref 15–41)
Albumin: 2.5 g/dL — ABNORMAL LOW (ref 3.5–5.0)
Alkaline Phosphatase: 108 U/L (ref 38–126)
Anion gap: 4 — ABNORMAL LOW (ref 5–15)
BUN: 17 mg/dL (ref 8–23)
CO2: 28 mmol/L (ref 22–32)
Calcium: 9 mg/dL (ref 8.9–10.3)
Chloride: 100 mmol/L (ref 98–111)
Creatinine, Ser: 1.43 mg/dL — ABNORMAL HIGH (ref 0.61–1.24)
GFR, Estimated: 50 mL/min — ABNORMAL LOW (ref >60)
Glucose, Bld: 84 mg/dL (ref 70–99)
Potassium: 4.7 mmol/L (ref 3.5–5.1)
Sodium: 132 mmol/L — ABNORMAL LOW (ref 135–145)
Total Bilirubin: 0.7 mg/dL (ref 0.3–1.2)
Total Protein: 5.6 g/dL — ABNORMAL LOW (ref 6.5–8.1)

## 2022-05-08 MED ORDER — SENNOSIDES-DOCUSATE SODIUM 8.6-50 MG PO TABS
2.0000 | ORAL_TABLET | Freq: Two times a day (BID) | ORAL | Status: DC
  Administered 2022-05-08 – 2022-05-12 (×7): 2 via ORAL
  Filled 2022-05-08 (×9): qty 2

## 2022-05-08 MED ORDER — MIDODRINE HCL 5 MG PO TABS
15.0000 mg | ORAL_TABLET | Freq: Three times a day (TID) | ORAL | Status: DC
  Administered 2022-05-08 – 2022-05-13 (×15): 15 mg via ORAL
  Filled 2022-05-08 (×16): qty 3

## 2022-05-08 NOTE — Progress Notes (Signed)
[facility] Card[facility] Encounter Note  Patient: Matthew Zamora / Admit Date: 04/17/2022 / Date of Encounter: 05/08/2022, 5:02 PM   Subjective: The patient continues to have some difficulty with severe dizziness weakness and fatigue as well as hypotension.  This is multifactorial in nature although after discontinuation of amiodarone and mexiletine this patient still has some difficulty.  There may be some washout still in effect.  The patient additionally has had increase in midodrine dose to help.  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  There is no primary cardiac origin to this issue at this time for which specific intervention would fix the concerns. Review of Systems: Positive for: Weakness dizziness Negative for: Vision change, hearing change, syncope, dizziness, nausea, vomiting,diarrhea, bloody stool, stomach pain, cough, congestion, diaphoresis, urinary frequency, urinary pain,skin lesions, skin rashes Others previously listed  Objective: Telemetry: Normal sinus rhythm Physical Exam: Blood pressure (!) 103/53, pulse 61, temperature 98.2 F (36.8 C), temperature source Oral, resp. rate 18, height '5\' 8"'$  (1.727 m), weight 54.1 kg, SpO2 96 %. Body mass index is 18.13 kg/m. General: Well developed, well nourished, in no acute distress. Head: Normocephalic, atraumatic, sclera non-icteric, no xanthomas, nares are without discharge. Neck: No apparent masses Lungs: Normal respirations with no wheezes, no rhonchi, no rales , no crackles   Heart: Regular rate and rhythm, normal S1 S2, no murmur, no rub, no gallop, PMI is normal size and placement, carotid upstroke normal without bruit, jugular venous pressure normal Abdomen: Soft, non-tender, non-distended with normoactive bowel sounds. No hepatosplenomegaly. Abdominal aorta is normal size without bruit Extremities: No edema, no clubbing, no  cyanosis, no ulcers,  Peripheral: 2+ radial, 2+ femoral, 2+ dorsal pedal pulses Neuro: Alert and oriented. Moves all extremities spontaneously. Psych:  Responds to questions appropriately with a normal affect.   Intake/Output Summary (Last 24 hours) at 05/08/2022 1702 Last data filed at 05/08/2022 1136 Gross per 24 hour  Intake --  Output 350 ml  Net -350 ml     Inpatient Medications:   (feeding supplement) PROSource Plus  30 mL Oral TID BM   apixaban  2.5 mg Oral BID   ascorbic acid  500 mg Oral Daily   aspirin  81 mg Oral Q1200   dextromethorphan-guaiFENesin  1 tablet Oral BID   feeding supplement  1 Container Oral TID BM   magnesium oxide  400 mg Oral Daily   midodrine  15 mg Oral TID WC   mometasone-formoterol  2 puff Inhalation BID   multivitamin with minerals  1 tablet Oral Daily   pantoprazole  40 mg Oral BID   pyridostigmine  60 mg Oral Q8H   sertraline  25 mg Oral Daily   sucralfate  1 g Oral TID WC & HS   Infusions:   Labs: Recent Labs    05/06/22 0444 05/07/22 0506 05/08/22 0358  NA 135 135 132*  K 4.5 4.5 4.7  CL 101 100 100  CO2 '30 30 28  '$ GLUCOSE 88 81 84  BUN '15 15 17  '$ CREATININE 1.45* 1.45* 1.43*  CALCIUM 8.9 8.8* 9.0  MG 2.2  --   --   PHOS 4.1  --   --     Recent Labs    05/06/22 0444 05/08/22 0358  AST 65* 55*  ALT 80* 66*  ALKPHOS 114 108  BILITOT 0.7 0.7  PROT 5.4* 5.6*  ALBUMIN 2.3* 2.5*    Recent Labs  05/06/22 0444  WBC 6.9  HGB 8.3*  HCT 26.2*  MCV 91.3  PLT 424*    No results for input(s): "CKTOTAL", "CKMB", "TROPONINI" in the last 72 hours. Invalid input(s): "POCBNP" No results for input(s): "HGBA1C" in the last 72 hours.   Weights: Filed Weights   05/06/22 0500 05/07/22 0706 05/08/22 0500  Weight: 55.4 kg 53 kg 54.1 kg     Radiology/Studies:  DG Chest 1 View  Result Date: 05/02/2022 CLINICAL DATA:  Dyspnea, hypoxia EXAM: CHEST  1 VIEW COMPARISON:  04/28/2022 FINDINGS: Multifocal airspace infiltrates,  asymmetrically more severe throughout the left lung, appears stable since prior examination, likely infectious or inflammatory. No pneumothorax or pleural effusion. Cardiac size within normal limits. Left subclavian pacemaker defibrillator is unchanged. No acute bone abnormality. IMPRESSION: 1. Stable multifocal pulmonary infiltrate, likely infectious or inflammatory. Electronically Signed   By: Fidela Salisbury M.D.   On: 05/02/2022 04:03   DG Chest 1 View  Result Date: 04/28/2022 CLINICAL DATA:  Cough. EXAM: CHEST  1 VIEW COMPARISON:  Chest radiograph dated 04/23/2022. FINDINGS: There is diffuse interstitial and airspace densities. Slight progression of densities in the left mid to upper lung field. No pleural effusion or pneumothorax. Stable cardiac silhouette. Left pectoral AICD device. Similar appearance of a kinked right ventricular lead. No acute osseous pathology. IMPRESSION: Diffuse interstitial and airspace densities with slight progression of densities in the left mid to upper lung field. Electronically Signed   By: Anner Crete M.D.   On: 04/28/2022 20:27   DG Chest Port 1 View  Result Date: 04/23/2022 CLINICAL DATA:  Productive cough EXAM: PORTABLE CHEST 1 VIEW COMPARISON:  04/20/2022, multiple old films dating back to 02/14/2020 FINDINGS: AICD remains in place. The right ventricular lead is kinked within the right ventricle. Heart is normal size. Mediastinal contours within normal limits. Patchy bilateral airspace disease most notable in the right mid lung and left lower lobe concerning for pneumonia. No effusions or acute bony abnormality. IMPRESSION: Patchy bilateral airspace disease concerning for pneumonia. AICD right ventricular lead kinked within the right ventricle. This is unchanged dating back to 01/03/2022. Electronically Signed   By: Rolm Baptise M.D.   On: 04/23/2022 11:33   ECHOCARDIOGRAM COMPLETE  Result Date: 04/21/2022    ECHOCARDIOGRAM REPORT   Patient Name:   Matthew Zamora Date of Exam: 04/21/2022 Medical Rec #:  [id number]           Height:       68.0 in Accession #:    [id number]          Weight:       146.4 lb Date of Birth:  June 06, 1941           BSA:          1.790 m Patient Age:    32 years            BP:           138/63 mmHg Patient Gender: M                   HR:           82 bpm. Exam Location:  [facility] Procedure: 2D Echo Indications:     CHF I50.21  History:         Patient has no prior history of Echocardiogram examinations.  Sonographer:     Kathlen Brunswick RDCS Referring Phys:  [id number] Claiborne Billings A GRIFFITH Diagnosing Phys: Yolonda Kida MD  Sonographer  Comments: Technically difficult study due to poor echo windows and suboptimal apical window. The patient coughed throughout this exam, making image acquisition challenging. IMPRESSIONS  1. Left ventricular ejection fraction, by estimation, is 30 to 35%. The left ventricle has moderately decreased function. The left ventricle demonstrates global hypokinesis. The left ventricular internal cavity size was mildly to moderately dilated. Left ventricular diastolic parameters are consistent with Grade III diastolic dysfunction (restrictive).  2. Right ventricular systolic function is low normal. The right ventricular size is mildly enlarged.  3. Left atrial size was moderately dilated.  4. Right atrial size was mild to moderately dilated.  5. The mitral valve is normal in structure. Trivial mitral valve regurgitation.  6. The aortic valve is normal in structure. Aortic valve regurgitation is not visualized. Aortic valve sclerosis/calcification is present, without any evidence of aortic stenosis. FINDINGS  Left Ventricle: Left ventricular ejection fraction, by estimation, is 30 to 35%. The left ventricle has moderately decreased function. The left ventricle demonstrates global hypokinesis. The left ventricular internal cavity size was mildly to moderately  dilated. There is no left ventricular hypertrophy. Left ventricular  diastolic parameters are consistent with Grade III diastolic dysfunction (restrictive). Right Ventricle: The right ventricular size is mildly enlarged. No increase in right ventricular wall thickness. Right ventricular systolic function is low normal. Left Atrium: Left atrial size was moderately dilated. Right Atrium: Right atrial size was mild to moderately dilated. Pericardium: There is no evidence of pericardial effusion. Mitral Valve: The mitral valve is normal in structure. Trivial mitral valve regurgitation. Tricuspid Valve: The tricuspid valve is normal in structure. Tricuspid valve regurgitation is mild. Aortic Valve: The aortic valve is normal in structure. Aortic valve regurgitation is not visualized. Aortic valve sclerosis/calcification is present, without any evidence of aortic stenosis. Aortic valve peak gradient measures 9.2 mmHg. Pulmonic Valve: The pulmonic valve was normal in structure. Pulmonic valve regurgitation is not visualized. Aorta: The ascending aorta was not well visualized. IAS/Shunts: No atrial level shunt detected by color flow Doppler. Additional Comments: A device lead is visualized.  LEFT VENTRICLE PLAX 2D LVIDd:         5.10 cm     Diastology LVIDs:         4.50 cm     LV e' medial:    5.33 cm/s LV PW:         1.00 cm     LV E/e' medial:  23.2 LV IVS:        1.00 cm     LV e' lateral:   11.16 cm/s LVOT diam:     1.80 cm     LV E/e' lateral: 11.1 LV SV:         38 LV SV Index:   21 LVOT Area:     2.54 cm  LV Volumes (MOD) LV vol d, MOD A4C: 94.7 ml LV vol s, MOD A4C: 64.0 ml LV SV MOD A4C:     94.7 ml RIGHT VENTRICLE RV Basal diam:  3.20 cm RV S prime:     11.30 cm/s TAPSE (M-mode): 2.5 cm LEFT ATRIUM           Index        RIGHT ATRIUM          Index LA diam:      5.20 cm 2.91 cm/m   RA Area:     8.76 cm LA Vol (A2C): 33.3 ml 18.60 ml/m  RA Volume:   18.30 ml 10.22 ml/m LA Vol (  A4C): 43.0 ml 24.02 ml/m  AORTIC VALVE                 PULMONIC VALVE AV Area (Vmax): 1.47 cm      PV Vmax:       0.87 m/s AV Vmax:        152.00 cm/s  PV Peak grad:  3.0 mmHg AV Peak Grad:   9.2 mmHg LVOT Vmax:      87.75 cm/s LVOT Vmean:     55.800 cm/s LVOT VTI:       0.149 m  AORTA Ao Root diam: 3.10 cm MITRAL VALVE                TRICUSPID VALVE MV Area (PHT): 4.71 cm     TR Peak grad:   46.2 mmHg MV Decel Time: 161 msec     TR Vmax:        340.00 cm/s MV E velocity: 123.50 cm/s MV A velocity: 58.40 cm/s   SHUNTS MV E/A ratio:  2.11         Systemic VTI:  0.15 m                             Systemic Diam: 1.80 cm Yolonda Kida MD Electronically signed by Yolonda Kida MD Signature Date/Time: 04/21/2022/12:41:53 PM    Final    DG Chest Port 1 View  Result Date: 04/20/2022 CLINICAL DATA:  Shortness of breath EXAM: PORTABLE CHEST 1 VIEW COMPARISON:  04/10/2022 FINDINGS: Dual-chamber ICD leads from the left in stable position. Unchanged heart size and mediastinal contours. Mild interstitial prominence but no Kerley lines, effusion, or air bronchogram. No pneumothorax. IMPRESSION: Borderline vascular congestion. Electronically Signed   By: Jorje Guild M.D.   On: 04/20/2022 06:35   US ABDOMEN LIMITED RUQ (LIVER/GB)  Result Date: 04/17/2022 CLINICAL DATA:  Abnormal LFT EXAM: ULTRASOUND ABDOMEN LIMITED RIGHT UPPER QUADRANT COMPARISON:  CT abdomen 03/27/2022, 05/10/2020 FINDINGS: Gallbladder: No gallstones or wall thickening visualized. No sonographic Murphy sign noted by sonographer. 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 Multiple septations. This cyst measures 6.1 x 5.5 x 5.4 cm and is stable from the prior CT from this year and 2021. Additional smaller cysts in the right lobe of liver better seen on CT. Portal vein is patent on color Doppler imaging with normal direction of blood flow towards the liver. Other: None. IMPRESSION: 1. The gallbladder and common bile duct are normal. 2. 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 This cyst is stable from the prior CT from this year and 2021. Electronically Signed   By: Franchot Gallo M.D.   On: 04/17/2022 15:29   CT Head Wo Contrast  Result Date: 04/17/2022 CLINICAL DATA:  Syncope EXAM: CT HEAD WITHOUT CONTRAST TECHNIQUE: Contiguous axial images were obtained from the base of the skull through the vertex without intravenous contrast. RADIATION DOSE REDUCTION: This exam was performed according to the departmental dose-optimization program which includes automated exposure control, adjustment of the mA and/or kV according to patient size and/or use of iterative reconstruction technique. COMPARISON:  CT head 03/27/2022 FINDINGS: Brain: No evidence of acute infarction, hemorrhage, hydrocephalus, extra-axial collection or mass lesion/mass effect. Mild white matter hypodensity consistent with chronic microvascular ischemia Vascular: Negative for hyperdense vessel Skull: Negative Sinuses/Orbits: Paranasal  sinuses clear. Bilateral cataract extraction Other: None IMPRESSION: No acute abnormality no interval change. Electronically Signed   By: Franchot Gallo M.D.   On: 04/17/2022 11:22   DG Chest Portable 1 View  Result Date: 04/10/2022 CLINICAL DATA:  Syncope, fall EXAM: PORTABLE CHEST 1 VIEW COMPARISON:  03/27/2022 FINDINGS: Cardiac size is within normal limits. Pacemaker/defibrillator battery is seen in left infraclavicular region. Coronary artery stents are noted. Lung fields are clear of any infiltrates or pulmonary edema. There is no pleural effusion or pneumothorax. IMPRESSION: There are no signs of pulmonary edema or focal pulmonary consolidation. Electronically Signed   By: Elmer Picker M.D.   On: 04/10/2022 15:30     Assessment and Recommendation  81 y.o. male with known moderate dilated cardiomyopathy ventricular tachycardia nausea vomiting orthostatic hypotension status post long hospital stay with minimal ambulation  now causing dizziness weakness but still with significant issues despite discontinuation of cardiac meds 1.  Continuation of physical therapy which appears to be working for the patient and slow improvements 2.  No cardiovascular diagnostics necessary at this time 3.  Continue to sustain from any cardiac meds which could exacerbate symptoms listed above. 4.  Further considerations of changes based on physical therapy including reverse Trendelenburg, compression when ambulating. 5.  Call if further questions Signed, Serafina Royals M.D. FACC

## 2022-05-08 NOTE — Progress Notes (Signed)
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 He is A and O x 4. Had been removed from O2 earlier in the day and was maintaining > 94% throughout session. BP in bed prior to sitting up EOB 112/52 but dropped to 82/51(59) and pt has c/o severe dizziness. Unable to maintain sitting EOB long enough to get 2 minute BP in sitting. MD/RN/OT informed. Pt is motivated but limited ability due to low BP concerns. After returning to bed, BP returned to 119/56. Recommend Dc to SNF due to concerns with pt living home alone.    Recommendations for follow up therapy are one component of a multi-disciplinary discharge planning process, led by the attending physician.  Recommendations may be updated based on patient status, additional functional criteria and insurance authorization.  Follow Up Recommendations  Skilled nursing-short term rehab (<3 hours/day) Can patient physically be transported by private vehicle: No   Assistance Recommended at Discharge Intermittent Supervision/Assistance  Patient can return home with the following A little help with walking and/or transfers;A little help with bathing/dressing/bathroom;Assist for transportation;Help with stairs or ramp for entrance;Assistance with cooking/housework   Equipment Recommendations  None recommended by PT       Precautions / Restrictions Precautions Precautions: Fall Precaution Comments: orthostatic Restrictions Weight Bearing Restrictions: No     Mobility  Bed Mobility Overal bed mobility: Modified Independent Bed Mobility: Supine to Sit, Sit to Supine Rolling:  Supervision Supine to sit: Supervision Sit to supine: Supervision General bed mobility comments: pt was safely able to achieve EOB sitting but due to dizziness/ drop in BP, required needing to return to supine with BLEs elevated for BP to recover. BP dropped as low as 82/51.    Transfers  General transfer comment: Unsafe to attempt transfer OOB due to BP/orthostatic hypotension.     Balance Overall balance assessment: History of Falls, Needs assistance Sitting-balance support: Feet supported, Bilateral upper extremity supported Sitting balance-Leahy Scale: Fair      Cognition Arousal/Alertness: Awake/alert Behavior During Therapy: WFL for tasks assessed/performed Overall Cognitive Status: Within Functional Limits for tasks assessed      General Comments: Pt is alert and cooperative. motivated to improve but severely limited by orthosttic hypotension.           General Comments General comments (skin integrity, edema, etc.): Messaged MD/RN/ and OT about pt's response to minimal activity.      Pertinent Vitals/Pain Pain Assessment Pain Assessment: 0-10 Pain Score: 4  Pain Location: bottom Pain Descriptors / Indicators: Sore Pain Intervention(s): Limited activity within patient's tolerance, Premedicated before session, Monitored during session, Repositioned     PT Goals (current goals can now be found in the care plan section) Acute Rehab PT Goals Patient Stated Goal: To return home once safe to do so Progress towards PT goals: Not progressing toward goals - comment (orthostatric hypotension continues to limited progress with PT)    Frequency    Min 2X/week      PT Plan Current plan remains appropriate       AM-PAC PT "6 Clicks" Mobility   Outcome Measure  Help needed turning from your back to your side  while in a flat bed without using bedrails?: None Help needed moving from lying on your back to sitting on the side of a flat bed without using bedrails?:  None Help needed moving to and from a bed to a chair (including a wheelchair)?: A Little Help needed standing up from a chair using your arms (e.g., wheelchair or bedside chair)?: A Little Help needed to walk in hospital room?: Total Help needed climbing 3-5 steps with a railing? : Total 6 Click Score: 16    End of Session Equipment Utilized During Treatment: Oxygen (on rm air throughout session with sao2 >94%) Activity Tolerance: Patient tolerated treatment well;Other (comment) (limited by hypotensive response to minimal activity) Patient left: in bed;with call bell/phone within reach;with bed alarm set Nurse Communication: Mobility status PT Visit Diagnosis: Unsteadiness on feet (R26.81);Muscle weakness (generalized) (M62.81);History of falling (Z91.81);Difficulty in walking, not elsewhere classified (R26.2)     Time: 6659-9357 PT Time Calculation (min) (ACUTE ONLY): 10 min  Charges:  $Therapeutic Activity: 8-22 mins                     Julaine Fusi PTA 05/08/22, 11:06 AM

## 2022-05-08 NOTE — Progress Notes (Signed)
Progress Note   Patient: Matthew Zamora EXN:[id number] DOB: 04/28/1941 DOA: 04/17/2022     20 DOS: the patient was seen and examined on 05/08/2022   Brief hospital course: Matthew Zamora is a 81 y.o. male with medical history significant of hypertension, hyperlipidemia, COPD, APF on Eliquis, CHF with EF of 35-40%, CAD, anemia, CKD-3B, frequent fall, who presented on 04/17/2022 for evaluation of dizziness and lightheadedness, generalized weakness, near syncopal episodes.  This occurred in the setting of about one week of GI illness with nausea and vomiting.  In the ED, afebrile and stable vitals.   Labs notable for K 5.5, no leukocytosis, normal lactic acid x 2, AST 129, ALT 136, Tbili 1.4.  BNP mildly elevated 354.5, troponin x 2 normal, negative for Covid-19 and flu A/B.  Admitted to medicine service for further evaluation and management.    11/2: pt with positive orthostatic vitals with severe symptoms of dizziness, unable to tolerate being upright.  Remains with poor PO intake, started on IV fluids.  11/3--5: ongoing N/V, dizziness. GI and cardiology consulted.  IV fluids stopped 11/4 due to dyspnea and cxr with mild vascular congestion.  Eliquis held for EGD. 11/6: EGD showed erosive esophagitis, submucosal duodenal nodule - biopsies benign.  Eliquis resumed.  Hospital course has been prolonged and complicated by worsened acute respiratory failure with hypoxia, increased O2 requirements.  Given intermittent diuresis as BP allows.  He had multifocal pneumonia, completed course of antibiotics.  He later had E. Coli infection with diarrhea which has resolved.    Pt has persistently had positive orthostatic BP's, ongoing dizziness when upright.  He has been unable to participate in PT.    He continues to have intermittent nausea/vomiting some days but appears in tolerating adequate PO intake.  Prognosis overall is guarded.  Patient remains high risk for clinical deterioration and  readmission.  11/22: Continues to be very orthostatic on PT evaluation today    Assessment and Plan: * Near syncope Persistent orthostatic hypotension Initially attributed to dehydration in the setting of 1 week nausea vomiting.  Orthostatic vitals were positive and have remained so despite fluids, midodrine, TED hose, binder.  -- Treated with IV fluids initially due to profound dehydration.  Off fluids and has required intermittent diuresis --Daily orthostatic vitals - remain positive --Monitor on telemetry --Supportive care for GI illness as outlined  11/18--22: still +orthostatics, symptomatic, pt cannot tolerate upright position.  No improvement with TED hose and abdominal binder. --Re-consulted [facility] cardiology for possible medication adjustments.  They recommended getting EP's input --Discussed case with Dr. Caryl Comes on 11/21.  Concern for autonomic failure given heart rate does not compensate for orthostatic drop in blood pressure.  He recommended stopping amiodarone and mexiletine.  Indicated midodrine can be used above max dose and sometimes used as high as 20 mg QID.   Also recommended maintaining patient in reverse Trendelenburg (feet down) to encourage it autonomic nervous system to compensate for gravity on a more regular basis.  Abdominal binder only when upright, thigh sleeves likely more helpful than TED hose on the distal lower extremities.  Fludrocortisone is a poor option in this heart failure patient who is very fluid sensitive requiring IV diuresis at times.  Pyridostigmine only works when vertical and upright, can be continued.  Dr. Caryl Comes encouraged to reach out if any further questions. -- Dr. Arbutus Ped had discussed above with Audelia Acton (daughter-in-law) who requested her to reach out to Dr. Luana Shu his EP at [facility].  She had left  a voicemail with summary of the case and my cell phone number and was awaiting callback.  Dyspnea Acute respiratory failure with hypoxia likely due to  pneumonia and CHF/fluid overload. Pneumonia has since resolved. --Supplement O2 if sats < 90% or PRN for comfort -- Has required intermittent gentle diuresis, very fluid sensitive -- He is weaned off to room air now  Nausea & vomiting Patient reported 1 week history of nausea vomiting at home, leading to dehydration and near syncopal episodes with dizziness lightheadedness. Family report N/V issues frequently for several months, chronic issue. -- IV fluids have been stopped due to recurrent dyspnea --IV antiemetics as needed --Monitor renal function and electrolytes --GI consulted, EGD done 11/6 (see erosive esophagitis).  NSVT (nonsustained ventricular tachycardia) ([place]) Patient has AICD in place. --[facility] cardiology, they now recommend EP input with [place].  See Near Syncope. --Has been on amiodarone, mexiletine in past.   --There is drug interaction with Amio increasing effects of mexiletine, side effects of mexiletine are similar patient's presenting symptoms. 11/21 EP Dr. Caryl Comes recommended stopping these as the combination would explain patient's persistent symptoms, currently d/c'd orders for these.  Outpatient evaluation with EP at [facility], Dr. Luana Shu --Monitor on telemetry. --Maintain K above 4, mag above 2  Orthostatic hypotension Persistent.  Continues to be extremely dizzy and lightheaded whenever in upright position. See orthostatic vitals in PT notes.  He continues to be severely orthostatic --Daily orthostatic vitals --Fall precautions --Started midodrine & dose was up-titrated to 10 mg TID WC (can consider increasing above max dose, as much as 20 mg).  I have increased the dose today to 15 mg 3 times daily --TED hose and Abdominal binder when upright --Try reverse trendelenburg bed position (at all times or at least for awhile prior to attempting upright)  Abnormal LFTs On admission AST 129, ALT 136, total bili 1.4. RUQ ultrasound showed normal gallbladder and CBD, stable  complex cyst in the right lobe of liver.  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   Acute hepatitis panel negative. -- Monitor LFTs  --GI consulted, signed off AST/ALT improving  Hyperkalemia K was 5.5 on admission, now normalized. Maintain K>4 given hx of dysrhythmias  CKD (chronic kidney disease), stage IIIb Baseline creatinine 1.7-1.8.  Renal function on admission was at baseline. Creatinine trended up in setting of dehydration with intractable N/V, low BP's.   Cr stable, near baseline --Treated with IV fluids --Daily BMP to monitor --Avoid nephrotoxins, hypotension --Renally dose meds.  Chronic systolic CHF (congestive heart failure) ([place]) On admission, hypovolemic and dehydrated. Diuretics on hold.  Was on IV fluids due to profound dehydration.  He developed worsening dyspnea and BNP was elevated, fluids were stopped and he was gently diuresed. 11/15--21: Gave IV Lasix a couple times this week due to increased O2 requirement up to 4 L after IV fluids were used in the setting of persistent diarrhea and ongoing nausea vomiting.  GI symptoms have improved and patient is tolerating p.o. intake fairly well.  Now stable on room air -- Intermittent diuresis as needed (appears he takes PRN torsemide at home) --Started on midodrine, continue --Metoprolol discontinued due to soft BP and severe orthostasis --Daily weights Net IO Since Admission: -11,044.71 mL [05/08/22 1346]  --Monitor closely -- [facility] cardiology is following.    Paroxysmal atrial fibrillation ([name]) Rate controlled. --Holding amiodarone, Mexitil after EP discussion as outlined. --Oral metoprolol stopped due to low BP's --On Eliquis --Monitor on telemetry --Lopressor IV PRN for HR sustained  above 110.  Cardiology following  Essential hypertension Hold metoprolol given soft BPs and severe orthostatic symptoms Diuretic on hold IV hydralazine as needed  Now on midodrine and uptitrating dose of  midodrine  Hyperlipidemia Lipitor on hold due to elevated LFTs.  CAD (coronary artery disease) Stable, no active chest pain. Continue ASA, metoprolol. Lipitor on hold due to elevated LFTs  COPD (chronic obstructive pulmonary disease) ([name]) Stable without wheezing. Continue bronchodilators.  Normocytic anemia Hemoglobin stable Monitor CBC.  Enteritis, enteropathogenic E. coli See Diarrhea  Diarrhea Stool studies positive for enteropathogentic E. Coli.  Treated with Xifaxan.  Diarrhea resolved  Multifocal pneumonia CXR showed bilateral opacities concerning for PNA.  Concern he maybe aspirated during intractable N/V and dry heaving.   --Completed course of Rocephin --Pulmonary hygiene --Mucinex and other supportive care PRN --Supplement O2 if needed to keep spO2>90%  Erosive esophagitis EGD on 11/6 notable for LA grade D esophagitis with no bleeding seen. --Biopsy path reviewed, benign --Continue PPI BID indefinitely -- Continue Carafate --Follow up in GI clinic as an outpatient  Intractable nausea and vomiting Chronic x several months. See Nausea & Vomiting, Erosive esophagitis EGD on 11/5 as above Follow up with GI as outpatient.  Dizziness See Near Syncope  Transaminitis See abnormal LFTs  Hyponatremia Likely hypovolemic in setting of N/V and dehydration.  Sodium 132 today  Postural dizziness with near syncope Remains ongoing challenge  Generalized weakness Due to recurrent N/V x several months, unable to get adequate calories / nutrition. --PT/OT recommending SNF. --TOC following for placement, patient had insurance Auth but due to his ongoing symptomatic orthostatic hypotension I do not think he is stable for discharge medically --Fall precautions.        Subjective: Still feeling dizzy especially when he moves.  Physical Exam: Vitals:   05/08/22 0500 05/08/22 0537 05/08/22 0823 05/08/22 1000  BP:  (!) 106/51 98/63   Pulse:  64 71   Resp:   '18 18 20  '$ Temp:  97.9 F (36.6 C) 98.2 F (36.8 C)   TempSrc:   Oral   SpO2:  98% 100% 98%  Weight: 54.1 kg     Height:       81 year old underweight frail-appearing male lying in the bed comfortably without any acute distress He has temporal wasting Lungs clear to auscultation bilaterally Cardiovascular regular rate and rhythm Abdomen soft, benign Neuro alert and awake, nonfocal Psych normal mood and affect  Data Reviewed:  Sodium 132, creatinine 1.43  Family Communication: Son updated over phone  Disposition: Status is: Inpatient Remains inpatient appropriate because: Still very orthostatic and symptomatic  Planned Discharge Destination: Skilled nursing facility   DVT prophylaxis-Eliquis Time spent: 35 minutes  Author: Max Sane, MD 05/08/2022 1:47 PM  For on call review [email or web address].

## 2022-05-08 NOTE — Care Management Important Message (Signed)
Important Message  Patient Details  Name: Matthew Zamora MRN: [id number] Date of Birth: 1941-05-26   Medicare Important Message Given:  Yes     Juliann Pulse A Bijon Mineer 05/08/2022, 2:50 PM

## 2022-05-08 NOTE — Progress Notes (Signed)
Occupational Therapy Treatment Patient Details Name: Matthew Zamora MRN: [id number] DOB: [birth year] Today's Date: 05/08/2022   History of present illness Pt is an 81 year old male presenting to ED with lightheadedness; PMH significant for  hypertension, hyperlipidemia, COPD, APF on Eliquis, CHF with EF of 35-40%, CAD, anemia, CKD-3B, frequent fall   OT comments  Pt seen for OT tx this date. Pt pleasant and eager to participate, hopeful to not be limited by dizziness and low BP. Compression stockings donned, abdominal binder in place, pt completed bed mobility with supervision. Endorsed 4/10 lightheadedness upon sitting that did not improve with time. Vitals below:   Room air, 95%  Supine HR 61 99/65 Sitting HR 70, 77/47 mild dizziness Sitting 10mn HR 71 88/55 dizzy the same  Returned to supine 2/2 pervasive symptoms and hypotension. Unsafe to attempt standing despite pt's willingness. Pt continues to benefit from skilled OT services. Continue to recommend SNF at this time 2/2 OH limiting safety/indep with all aspects of mobility and ADL.    Recommendations for follow up therapy are one component of a multi-disciplinary discharge planning process, led by the attending physician.  Recommendations may be updated based on patient status, additional functional criteria and insurance authorization.    Follow Up Recommendations  Skilled nursing-short term rehab (<3 hours/day)     Assistance Recommended at Discharge Frequent or constant Supervision/Assistance  Patient can return home with the following  A lot of help with walking and/or transfers;A lot of help with bathing/dressing/bathroom   Equipment Recommendations  Other (comment) (defer to next venue)    Recommendations for Other Services      Precautions / Restrictions Precautions Precautions: Fall Precaution Comments: orthostatic Restrictions Weight Bearing Restrictions: No       Mobility Bed Mobility Overal bed  mobility: Modified Independent Bed Mobility: Supine to Sit, Sit to Supine     Supine to sit: Supervision Sit to supine: Supervision        Transfers                   General transfer comment: Unsafe to attempt transfer OOB due to BP/orthostatic hypotension.     Balance Overall balance assessment: History of Falls, Needs assistance Sitting-balance support: Feet supported, Bilateral upper extremity supported Sitting balance-Leahy Scale: Fair Sitting balance - Comments: SBA as pt endorsed dizziness                                   ADL either performed or assessed with clinical judgement   ADL                                              Extremity/Trunk Assessment              Vision       Perception     Praxis      Cognition Arousal/Alertness: Awake/alert Behavior During Therapy: WFL for tasks assessed/performed Overall Cognitive Status: Within Functional Limits for tasks assessed                                          Exercises      Shoulder Instructions       General  Comments Messaged MD/RN/ and OT about pt's response to minimal activity.    Pertinent Vitals/ Pain       Pain Assessment Pain Assessment: No/denies pain  Home Living                                          Prior Functioning/Environment              Frequency  Min 2X/week        Progress Toward Goals  OT Goals(current goals can now be found in the care plan section)  Progress towards OT goals: OT to reassess next treatment  Acute Rehab OT Goals Patient Stated Goal: feel better OT Goal Formulation: With patient Time For Goal Achievement: 05/15/22 Potential to Achieve Goals: Good  Plan Discharge plan remains appropriate;Frequency remains appropriate    Co-evaluation                 AM-PAC OT "6 Clicks" Daily Activity     Outcome Measure   Help from another person eating  meals?: None Help from another person taking care of personal grooming?: A Little Help from another person toileting, which includes using toliet, bedpan, or urinal?: A Lot Help from another person bathing (including washing, rinsing, drying)?: A Lot Help from another person to put on and taking off regular upper body clothing?: A Little Help from another person to put on and taking off regular lower body clothing?: A Lot 6 Click Score: 16    End of Session    OT Visit Diagnosis: Unsteadiness on feet (R26.81);Muscle weakness (generalized) (M62.81);Repeated falls (R29.6)   Activity Tolerance Other (comment);Patient tolerated treatment well (dizziness and hypotension)   Patient Left in bed;with call bell/phone within reach;with bed alarm set   Nurse Communication          Time: 7902-4097 OT Time Calculation (min): 20 min  Charges: OT General Charges $OT Visit: 1 Visit OT Treatments $Therapeutic Activity: 8-22 mins  Ardeth Perfect., MPH, MS, OTR/L ascom [phone] 05/08/22, 2:47 PM

## 2022-05-09 DIAGNOSIS — R0601 Orthopnea: Secondary | ICD-10-CM | POA: Diagnosis not present

## 2022-05-09 DIAGNOSIS — I951 Orthostatic hypotension: Secondary | ICD-10-CM | POA: Diagnosis not present

## 2022-05-09 DIAGNOSIS — Z7189 Other specified counseling: Secondary | ICD-10-CM

## 2022-05-09 DIAGNOSIS — I4729 Other ventricular tachycardia: Secondary | ICD-10-CM | POA: Diagnosis not present

## 2022-05-09 DIAGNOSIS — R55 Syncope and collapse: Secondary | ICD-10-CM | POA: Diagnosis not present

## 2022-05-09 LAB — CBC
HCT: 27.8 % — ABNORMAL LOW (ref 39.0–52.0)
Hemoglobin: 8.9 g/dL — ABNORMAL LOW (ref 13.0–17.0)
MCH: 28.9 pg (ref 26.0–34.0)
MCHC: 32 g/dL (ref 30.0–36.0)
MCV: 90.3 fL (ref 80.0–100.0)
Platelets: 430 10*3/uL — ABNORMAL HIGH (ref 150–400)
RBC: 3.08 MIL/uL — ABNORMAL LOW (ref 4.22–5.81)
RDW: 14.2 % (ref 11.5–15.5)
WBC: 6.5 10*3/uL (ref 4.0–10.5)
nRBC: 0 % (ref 0.0–0.2)

## 2022-05-09 LAB — BASIC METABOLIC PANEL
Anion gap: 4 — ABNORMAL LOW (ref 5–15)
BUN: 17 mg/dL (ref 8–23)
CO2: 28 mmol/L (ref 22–32)
Calcium: 9 mg/dL (ref 8.9–10.3)
Chloride: 102 mmol/L (ref 98–111)
Creatinine, Ser: 1.52 mg/dL — ABNORMAL HIGH (ref 0.61–1.24)
GFR, Estimated: 46 mL/min — ABNORMAL LOW (ref >60)
Glucose, Bld: 82 mg/dL (ref 70–99)
Potassium: 4.6 mmol/L (ref 3.5–5.1)
Sodium: 134 mmol/L — ABNORMAL LOW (ref 135–145)

## 2022-05-09 MED ORDER — METOCLOPRAMIDE HCL 5 MG PO TABS
5.0000 mg | ORAL_TABLET | Freq: Three times a day (TID) | ORAL | Status: DC | PRN
  Administered 2022-05-09 – 2022-05-10 (×3): 5 mg via ORAL
  Filled 2022-05-09 (×4): qty 1

## 2022-05-09 NOTE — Progress Notes (Signed)
Progress Note   Patient: Matthew Zamora ATF:[id number] DOB: [birth year] DOA: 04/17/2022     21 DOS: the patient was seen and examined on 05/09/2022   Brief hospital course: JAMUS LOVING is a 81 y.o. male with medical history significant of hypertension, hyperlipidemia, COPD, APF on Eliquis, CHF with EF of 35-40%, CAD, anemia, CKD-3B, frequent fall, who presented on 04/17/2022 for evaluation of dizziness and lightheadedness, generalized weakness, near syncopal episodes.  This occurred in the setting of about one week of GI illness with nausea and vomiting.  In the ED, afebrile and stable vitals.   Labs notable for K 5.5, no leukocytosis, normal lactic acid x 2, AST 129, ALT 136, Tbili 1.4.  BNP mildly elevated 354.5, troponin x 2 normal, negative for Covid-19 and flu A/B.  Admitted to medicine service for further evaluation and management.    11/2: pt with positive orthostatic vitals with severe symptoms of dizziness, unable to tolerate being upright.  Remains with poor PO intake, started on IV fluids.  11/3--5: ongoing N/V, dizziness. GI and cardiology consulted.  IV fluids stopped 11/4 due to dyspnea and cxr with mild vascular congestion.  Eliquis held for EGD. 11/6: EGD showed erosive esophagitis, submucosal duodenal nodule - biopsies benign.  Eliquis resumed.  Hospital course has been prolonged and complicated by worsened acute respiratory failure with hypoxia, increased O2 requirements.  Given intermittent diuresis as BP allows.  He had multifocal pneumonia, completed course of antibiotics.  He later had E. Coli infection with diarrhea which has resolved.    Pt has persistently had positive orthostatic BP's, ongoing dizziness when upright.  He has been unable to participate in PT.    He continues to have intermittent nausea/vomiting some days but appears in tolerating adequate PO intake.  Prognosis overall is guarded.  Patient remains high risk for clinical deterioration and  readmission.  11/22: Continues to be very orthostatic on PT evaluation  11/23: Increased dose of midodrine to 15 mg 3 times daily and added Reglan    Assessment and Plan: * Near syncope Persistent orthostatic hypotension Initially attributed to dehydration in the setting of 1 week nausea vomiting.  Orthostatic vitals were positive and have remained so despite fluids, midodrine, TED hose, binder.  -- Treated with IV fluids initially due to profound dehydration.  Off fluids and has required intermittent diuresis --Daily orthostatic vitals - remain positive --Monitor on telemetry --Supportive care for GI illness as outlined  11/18--22: still +orthostatics, symptomatic, pt cannot tolerate upright position.  No improvement with TED hose and abdominal binder. --Re-consulted [facility] cardiology for possible medication adjustments.  They recommended getting EP's input --Discussed case with Dr. Caryl Comes on 11/21.  Concern for autonomic failure given heart rate does not compensate for orthostatic drop in blood pressure.  He recommended stopping amiodarone and mexiletine.  Indicated midodrine can be used above max dose and sometimes used as high as 20 mg QID.   Also recommended maintaining patient in reverse Trendelenburg (feet down) to encourage it autonomic nervous system to compensate for gravity on a more regular basis.  Abdominal binder only when upright, thigh sleeves likely more helpful than TED hose on the distal lower extremities.  Fludrocortisone is a poor option in this heart failure patient who is very fluid sensitive requiring IV diuresis at times.  Pyridostigmine only works when vertical and upright, can be continued.  Dr. Caryl Comes encouraged to reach out if any further questions. -- Dr. Arbutus Ped had discussed above with Audelia Acton (daughter-in-law) who requested her  to reach out to Dr. Luana Shu his EP at [facility].  She had left a voicemail with summary of the case and my cell phone number and was awaiting  callback.  Dyspnea Acute respiratory failure with hypoxia likely due to pneumonia and CHF/fluid overload. Pneumonia has since resolved. --Supplement O2 if sats < 90% or PRN for comfort -- Has required intermittent gentle diuresis, very fluid sensitive -- He is weaned off to room air now  Nausea & vomiting Patient reported 1 week history of nausea vomiting at home, leading to dehydration and near syncopal episodes with dizziness lightheadedness. Family report N/V issues frequently for several months, chronic issue. -- IV fluids have been stopped due to recurrent dyspnea --IV antiemetics as needed --Monitor renal function and electrolytes --GI consulted, EGD done 11/6 (see erosive esophagitis).  NSVT (nonsustained ventricular tachycardia) ([place]) Patient has AICD in place. --[facility] cardiology, they now recommend EP input with [place].  See Near Syncope. --Has been on amiodarone, mexiletine in past.   --There is drug interaction with Amio increasing effects of mexiletine, side effects of mexiletine are similar patient's presenting symptoms. 11/21 EP Dr. Caryl Comes recommended stopping these as the combination would explain patient's persistent symptoms, currently d/c'd orders for these.  Outpatient evaluation with EP at [facility], Dr. Luana Shu --Monitor on telemetry. --Maintain K above 4, mag above 2  Orthostatic hypotension Persistent.  Continues to be extremely dizzy and lightheaded whenever in upright position. See orthostatic vitals in PT notes.  He continues to be severely orthostatic --Daily orthostatic vitals --Fall precautions --Started midodrine & dose was up-titrated to 15 mg TID on 11/23 (can consider increasing above max dose, as much as 20 mg).   --TED hose and Abdominal binder when upright --Try reverse trendelenburg bed position (at all times or at least for awhile prior to attempting upright)  Abnormal LFTs On admission AST 129, ALT 136, total bili 1.4. RUQ ultrasound showed normal  gallbladder and CBD, stable complex cyst in the right lobe of liver.  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   Acute hepatitis panel negative. -- Monitor LFTs  --GI consulted, signed off AST/ALT improving  Hyperkalemia K was 5.5 on admission, now normalized. Maintain K>4 given hx of dysrhythmias  CKD (chronic kidney disease), stage IIIb Baseline creatinine 1.7-1.8.  Renal function on admission was at baseline. Creatinine trended up in setting of dehydration with intractable N/V, low BP's.   Cr stable, near baseline --Treated with IV fluids --Daily BMP to monitor --Avoid nephrotoxins, hypotension --Renally dose meds.  Chronic systolic CHF (congestive heart failure) ([place]) On admission, hypovolemic and dehydrated. Diuretics on hold.  Was on IV fluids due to profound dehydration.  He developed worsening dyspnea and BNP was elevated, fluids were stopped and he was gently diuresed. 11/15--21: Gave IV Lasix a couple times this week due to increased O2 requirement up to 4 L after IV fluids were used in the setting of persistent diarrhea and ongoing nausea vomiting.  GI symptoms have improved and patient is tolerating p.o. intake fairly well.  Now stable on room air -- Intermittent diuresis as needed (appears he takes PRN torsemide at home) --Started on midodrine, continue --Metoprolol discontinued due to soft BP and severe orthostasis --Daily weights Net IO Since Admission: -11,344.71 mL [05/09/22 1218]  --Monitor closely -- [facility] cardiology is following.    Paroxysmal atrial fibrillation ([name]) Rate controlled. --Holding amiodarone, Mexitil after EP discussion as outlined. --Oral metoprolol stopped due to low BP's --On Eliquis --Monitor on telemetry --Lopressor IV  PRN for HR sustained above 110.  Cardiology following  Essential hypertension Hold metoprolol given soft BPs and severe orthostatic symptoms Diuretic on hold IV hydralazine as needed  Now on  midodrine and uptitrating dose of midodrine.  On 15 mg 3 times daily  Hyperlipidemia Lipitor on hold due to elevated LFTs.  CAD (coronary artery disease) Stable, no active chest pain. Continue ASA, metoprolol. Lipitor on hold due to elevated LFTs  COPD (chronic obstructive pulmonary disease) ([name]) Stable without wheezing. Continue bronchodilators.  Normocytic anemia Hemoglobin stable Monitor CBC.  Protein-calorie malnutrition, severe Complicates overall prognosis  Enteritis, enteropathogenic E. coli See Diarrhea  Diarrhea Stool studies positive for enteropathogentic E. Coli.  Treated with Xifaxan.  Diarrhea resolved  Multifocal pneumonia CXR showed bilateral opacities concerning for PNA.  Concern he maybe aspirated during intractable N/V and dry heaving.   --Completed course of Rocephin --Pulmonary hygiene --Mucinex and other supportive care PRN -- He is on room air now  Erosive esophagitis EGD on 11/6 notable for LA grade D esophagitis with no bleeding seen. --Biopsy path reviewed, benign --Continue PPI BID indefinitely -- Continue Carafate --Follow up in GI clinic as an outpatient  Intractable nausea and vomiting Chronic x several months. See Nausea & Vomiting, Erosive esophagitis EGD on 11/5 as above Follow up with GI as outpatient. Will add Reglan for refractory nausea vomiting if it helps  Dizziness See Near Syncope  Transaminitis See abnormal LFTs  Hyponatremia Likely hypovolemic in setting of N/V and dehydration.  Sodium 134 today  Postural dizziness with near syncope Remains ongoing challenge.  Increased dose of midodrine yesterday  Generalized weakness Due to recurrent N/V x several months, unable to get adequate calories / nutrition. --PT/OT recommending SNF. --TOC following for placement, patient had insurance Auth but due to his ongoing symptomatic orthostatic hypotension I do not think he is stable for discharge medically --Fall  precautions.        Subjective: Optimistic and wanting to continue working with therapy to get better  Physical Exam: Vitals:   05/09/22 0038 05/09/22 0526 05/09/22 0754 05/09/22 1132  BP: (!) 130/53 (!) 125/56 106/60 (!) 110/56  Pulse: 63 (!) 59 64 65  Resp: '16 17 17 17  '$ Temp: 98.3 F (36.8 C) 98.1 F (36.7 C) 98.3 F (36.8 C) 98 F (36.7 C)  TempSrc: Oral     SpO2: 97% 96% 96% 99%  Weight:      Height:       81 year old underweight frail-appearing male lying in the bed comfortably without any acute distress He has temporal wasting Lungs clear to auscultation bilaterally Cardiovascular regular rate and rhythm Abdomen soft, benign Neuro alert and awake, nonfocal Psych normal mood and affect Data Reviewed:  Hemoglobin 8.9  Family Communication: Son was updated yesterday over phone  Disposition: Status is: Inpatient Remains inpatient appropriate because: Management of symptomatic orthostatic hypotension  Planned Discharge Destination: Skilled nursing facility   DVT prophylaxis-Eliquis Time spent: 35 minutes  Author: Max Sane, MD 05/09/2022 12:19 PM  For on call review [email or web address].

## 2022-05-09 NOTE — Assessment & Plan Note (Signed)
Remains ongoing challenge.  Increased dose of midodrine yesterday

## 2022-05-09 NOTE — Assessment & Plan Note (Signed)
Likely hypovolemic in setting of N/V and dehydration.  Sodium 134 today

## 2022-05-09 NOTE — Assessment & Plan Note (Signed)
Rate controlled. --Holding amiodarone, Mexitil after EP discussion as outlined. --Oral metoprolol stopped due to low BP's --On Eliquis --Monitor on telemetry --Lopressor IV PRN for HR sustained above 110.  Cardiology following

## 2022-05-09 NOTE — Assessment & Plan Note (Signed)
CXR showed bilateral opacities concerning for PNA.  Concern he maybe aspirated during intractable N/V and dry heaving.   --Completed course of Rocephin --Pulmonary hygiene --Mucinex and other supportive care PRN -- He is on room air now

## 2022-05-09 NOTE — Assessment & Plan Note (Signed)
Due to recurrent N/V x several months, unable to get adequate calories / nutrition. --PT/OT recommending SNF. --TOC following for placement, patient had insurance Auth but due to his ongoing symptomatic orthostatic hypotension I do not think he is stable for discharge medically --Fall precautions.

## 2022-05-09 NOTE — Assessment & Plan Note (Signed)
Persistent orthostatic hypotension Initially attributed to dehydration in the setting of 1 week nausea vomiting.  Orthostatic vitals were positive and have remained so despite fluids, midodrine, TED hose, binder.  -- Treated with IV fluids initially due to profound dehydration.  Off fluids and has required intermittent diuresis --Daily orthostatic vitals - remain positive --Monitor on telemetry --Supportive care for GI illness as outlined  11/18--22: still +orthostatics, symptomatic, pt cannot tolerate upright position.  No improvement with TED hose and abdominal binder. --Re-consulted [facility] cardiology for possible medication adjustments.  They recommended getting EP's input --Discussed case with Dr. Caryl Comes on 11/21.  Concern for autonomic failure given heart rate does not compensate for orthostatic drop in blood pressure.  He recommended stopping amiodarone and mexiletine.  Indicated midodrine can be used above max dose and sometimes used as high as 20 mg QID.   Also recommended maintaining patient in reverse Trendelenburg (feet down) to encourage it autonomic nervous system to compensate for gravity on a more regular basis.  Abdominal binder only when upright, thigh sleeves likely more helpful than TED hose on the distal lower extremities.  Fludrocortisone is a poor option in this heart failure patient who is very fluid sensitive requiring IV diuresis at times.  Pyridostigmine only works when vertical and upright, can be continued.  Dr. Caryl Comes encouraged to reach out if any further questions. -- Dr. Arbutus Ped had discussed above with Audelia Acton (daughter-in-law) who requested her to reach out to Dr. Luana Shu his EP at [facility].  She had left a voicemail with summary of the case and my cell phone number and was awaiting callback.

## 2022-05-09 NOTE — Assessment & Plan Note (Signed)
Hemoglobin stable Monitor CBC.

## 2022-05-09 NOTE — Assessment & Plan Note (Signed)
Stable, no active chest pain. Continue ASA, metoprolol. Lipitor on hold due to elevated LFTs

## 2022-05-09 NOTE — Assessment & Plan Note (Signed)
Hold metoprolol given soft BPs and severe orthostatic symptoms Diuretic on hold IV hydralazine as needed  Now on midodrine and uptitrating dose of midodrine.  On 15 mg 3 times daily

## 2022-05-09 NOTE — Assessment & Plan Note (Signed)
Chronic x several months. See Nausea & Vomiting, Erosive esophagitis EGD on 11/5 as above Follow up with GI as outpatient. Will add Reglan for refractory nausea vomiting if it helps

## 2022-05-09 NOTE — Assessment & Plan Note (Signed)
On admission AST 129, ALT 136, total bili 1.4. RUQ ultrasound showed normal gallbladder and CBD, stable complex cyst in the right lobe of liver.  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   Acute hepatitis panel negative. -- Monitor LFTs  --GI consulted, signed off AST/ALT improving

## 2022-05-09 NOTE — Assessment & Plan Note (Signed)
K was 5.5 on admission, now normalized. Maintain K>4 given hx of dysrhythmias

## 2022-05-09 NOTE — Assessment & Plan Note (Signed)
Baseline creatinine 1.7-1.8.  Renal function on admission was at baseline. Creatinine trended up in setting of dehydration with intractable N/V, low BP's.   Cr stable, near baseline --Treated with IV fluids --Daily BMP to monitor --Avoid nephrotoxins, hypotension --Renally dose meds.

## 2022-05-09 NOTE — Assessment & Plan Note (Signed)
Patient reported 1 week history of nausea vomiting at home, leading to dehydration and near syncopal episodes with dizziness lightheadedness. Family report N/V issues frequently for several months, chronic issue. -- IV fluids have been stopped due to recurrent dyspnea --IV antiemetics as needed --Monitor renal function and electrolytes --GI consulted, EGD done 11/6 (see erosive esophagitis).

## 2022-05-09 NOTE — Assessment & Plan Note (Signed)
On admission, hypovolemic and dehydrated. Diuretics on hold.  Was on IV fluids due to profound dehydration.  He developed worsening dyspnea and BNP was elevated, fluids were stopped and he was gently diuresed. 11/15--21: Gave IV Lasix a couple times this week due to increased O2 requirement up to 4 L after IV fluids were used in the setting of persistent diarrhea and ongoing nausea vomiting.  GI symptoms have improved and patient is tolerating p.o. intake fairly well.  Now stable on room air -- Intermittent diuresis as needed (appears he takes PRN torsemide at home) --Started on midodrine, continue --Metoprolol discontinued due to soft BP and severe orthostasis --Daily weights Net IO Since Admission: -11,344.71 mL [05/09/22 1218]  --Monitor closely -- [facility] cardiology is following.

## 2022-05-09 NOTE — Progress Notes (Addendum)
Daily Progress Note   Patient Name: Matthew Zamora       Date: 05/09/2022 DOB: [birth year]  Age: 81 y.o. MRN#: [id number] Attending Physician: Max Sane, MD Primary Care Physician: [place] Pink, MD Admit Date: 04/17/2022  Reason for Consultation/Follow-up: Establishing goals of care  Subjective: Notes and labs reviewed. Patient is sitting in bed. He is nauseated with a vomit cup. He discusses his dizziness and nausea. He states he is hopeful to get a grasp on the root of his hypotension as this was never an issue before this admission. He confirms full code/ full scope. On previous visit, Zofran had not worked for him at all for N/V. At that time, he stated Compazine helped. Compazine has been D/C'd. Could consider Reglan for N/V.    Length of Stay: 21  Current Medications: Scheduled Meds:   (feeding supplement) PROSource Plus  30 mL Oral TID BM   apixaban  2.5 mg Oral BID   ascorbic acid  500 mg Oral Daily   aspirin  81 mg Oral Q1200   dextromethorphan-guaiFENesin  1 tablet Oral BID   feeding supplement  1 Container Oral TID BM   magnesium oxide  400 mg Oral Daily   midodrine  15 mg Oral TID WC   mometasone-formoterol  2 puff Inhalation BID   multivitamin with minerals  1 tablet Oral Daily   pantoprazole  40 mg Oral BID   pyridostigmine  60 mg Oral Q8H   senna-docusate  2 tablet Oral BID   sertraline  25 mg Oral Daily   sucralfate  1 g Oral TID WC & HS    Continuous Infusions:   PRN Meds: acetaminophen, albuterol, benzonatate, calcium carbonate, HYDROcodone bit-homatropine, liver oil-zinc oxide, metoprolol tartrate, ondansetron, polyvinyl alcohol  Physical Exam Pulmonary:     Effort: Pulmonary effort is normal.  Neurological:     Mental Status: He is alert.              Vital Signs: BP 106/60 (BP Location: Left Arm)   Pulse 64   Temp 98.3 F (36.8 C)   Resp 17   Ht '5\' 8"'$  (1.727 m)   Wt 54.1 kg   SpO2 96%   BMI 18.13 kg/m  SpO2: SpO2: 96 % O2 Device: O2 Device: Room Air O2 Flow Rate: O2 Flow Rate (  L/min): 2 L/min  Intake/output summary:  Intake/Output Summary (Last 24 hours) at 05/09/2022 1038 Last data filed at 05/09/2022 0531 Gross per 24 hour  Intake --  Output 450 ml  Net -450 ml   LBM: Last BM Date : 05/08/22 (per pt) Baseline Weight: Weight: 63.6 kg Most recent weight: Weight: 54.1 kg   Patient Active Problem List   Diagnosis Date Noted   Protein-calorie malnutrition, severe 05/08/2022   Enteritis, enteropathogenic E. coli 04/29/2022   Diarrhea 04/28/2022   Multifocal pneumonia 04/23/2022   Erosive esophagitis 04/22/2022   Dizziness 04/21/2022   Intractable nausea and vomiting 04/21/2022   Dyspnea 04/20/2022   Transaminitis 04/20/2022   Hyponatremia 04/19/2022   Nausea & vomiting 04/18/2022   Orthostatic hypotension 04/18/2022   Postural dizziness with near syncope 04/18/2022   Near syncope 04/17/2022   COPD (chronic obstructive pulmonary disease) ([place]) 04/17/2022   Hyperkalemia 04/17/2022   Abnormal LFTs 88/41/6606   Chronic systolic CHF (congestive heart failure) ([place]) 04/17/2022   Normocytic anemia 04/17/2022   GERD without esophagitis 02/12/2022   Recurrent falls    Generalized weakness    NSVT (nonsustained ventricular tachycardia) ([place]) 02/11/2022   Acute on chronic combined systolic (congestive) and diastolic (congestive) heart failure ([place]) 08/10/2021   Acute respiratory failure with hypoxia ([place])    HCAP (healthcare-associated pneumonia) 06/13/2021   Acute on chronic systolic CHF (congestive heart failure) ([place]) 06/13/2021   Elevated troponin 06/13/2021   CKD (chronic kidney disease), 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   Lung nodule 06/13/2021   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   COPD exacerbation ([place]) 06/13/2021   Volume  depletion 12/15/2019   Hypotension 12/15/2019   Cardiorenal syndrome    Atrial fibrillation with RVR ([place])    Defibrillator discharge    Tachycardia 10/25/2019   CHF (congestive heart failure) ([place]) 09/18/2019   Acute on chronic combined systolic and diastolic CHF (congestive heart failure) ([place]) 09/17/2019   CAD (coronary artery disease) 09/17/2019   Ischemic cardiomyopathy 09/17/2019   CKD (chronic kidney disease) stage 3, GFR 30-59 ml/min ([name]) 09/17/2019   Acute hypoxemic respiratory failure ([place]) 09/17/2018   AAA (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 ([place]) 07/06/2018   Essential hypertension 07/06/2018   Hyperlipidemia 07/06/2018   Paroxysmal atrial fibrillation ([name])    Acute on chronic respiratory failure with hypoxia ([place]) 04/26/2018    Palliative Care Assessment & Plan    Recommendations/Plan: Patient with N/V. Zofran has not worked for him. Compazine has been discontinued. Could consider Reglan.   Continue full code/ full scope.   Code Status:    Code Status Orders  (From admission, onward)           Start     Ordered   04/17/22 1306  Full code  Continuous        04/17/22 1306           Code Status History     Date Active Date Inactive Code Status Order ID Comments User Context   02/12/2022 0156 02/13/2022 2152 Full Code [id number]  Christel Mormon, MD ED   08/10/2021 0928 08/12/2021 2007 Full Code [id number]  Ivor Costa, MD ED   08/10/2021 0534 08/10/2021 0928 Full Code [id number]  Rhetta Mura, DO ED   06/13/2021 0822 06/16/2021 2220 Full Code [id number]  Ivor Costa, MD ED   12/15/2019 1933 12/16/2019 1854 DNR [id number]  Flora Lipps, MD ED   10/26/2019 0027 10/29/2019 1329 Full Code [id number]  Elwyn Reach, MD ED   09/17/2019 0442 09/18/2019 2109 Full  Code [id number]  Athena Masse, MD ED   09/17/2018 [phone] 09/21/2018 2027 Full Code [id number]  Bettey Costa, MD ED   04/26/2018 0253 05/13/2018 1923 Full Code [id number]  Amelia Jo, MD ED      Advance  Directive Documentation    [place] Most Recent Value  Type of Advance Directive Living will  Pre-existing out of facility DNR order (yellow form or pink MOST form) --  "MOST" Form in Place? --       Care plan was discussed with attending  Thank you for allowing the Palliative Medicine Team to assist in the care of this patient.   Asencion Gowda, NP  Please contact Palliative Medicine Team phone at [phone] for questions and concerns.

## 2022-05-09 NOTE — Assessment & Plan Note (Signed)
Stable without wheezing. Continue bronchodilators.

## 2022-05-09 NOTE — Assessment & Plan Note (Signed)
Patient has AICD in place. --[facility] cardiology, they now recommend EP input with [place].  See Near Syncope. --Has been on amiodarone, mexiletine in past.   --There is drug interaction with Amio increasing effects of mexiletine, side effects of mexiletine are similar patient's presenting symptoms. 11/21 EP Dr. Caryl Comes recommended stopping these as the combination would explain patient's persistent symptoms, currently d/c'd orders for these.  Outpatient evaluation with EP at [facility], Dr. Luana Shu --Monitor on telemetry. --Maintain K above 4, mag above 2

## 2022-05-09 NOTE — Assessment & Plan Note (Signed)
Stool studies positive for enteropathogentic E. Coli.  Treated with Xifaxan.  Diarrhea resolved

## 2022-05-09 NOTE — Assessment & Plan Note (Signed)
EGD on 11/6 notable for LA grade D esophagitis with no bleeding seen. --Biopsy path reviewed, benign --Continue PPI BID indefinitely -- Continue Carafate --Follow up in GI clinic as an outpatient

## 2022-05-09 NOTE — Assessment & Plan Note (Signed)
Lipitor on hold due to elevated LFTs.

## 2022-05-09 NOTE — Assessment & Plan Note (Signed)
Persistent.  Continues to be extremely dizzy and lightheaded whenever in upright position. See orthostatic vitals in PT notes.  He continues to be severely orthostatic --Daily orthostatic vitals --Fall precautions --Started midodrine & dose was up-titrated to 15 mg TID on 11/23 (can consider increasing above max dose, as much as 20 mg).   --TED hose and Abdominal binder when upright --Try reverse trendelenburg bed position (at all times or at least for awhile prior to attempting upright)

## 2022-05-09 NOTE — Assessment & Plan Note (Signed)
Complicates overall prognosis. 

## 2022-05-10 DIAGNOSIS — R55 Syncope and collapse: Secondary | ICD-10-CM | POA: Diagnosis not present

## 2022-05-10 DIAGNOSIS — N1832 Chronic kidney disease, stage 3b: Secondary | ICD-10-CM | POA: Diagnosis not present

## 2022-05-10 DIAGNOSIS — E43 Unspecified severe protein-calorie malnutrition: Secondary | ICD-10-CM

## 2022-05-10 DIAGNOSIS — R0601 Orthopnea: Secondary | ICD-10-CM | POA: Diagnosis not present

## 2022-05-10 DIAGNOSIS — I951 Orthostatic hypotension: Secondary | ICD-10-CM | POA: Diagnosis not present

## 2022-05-10 MED ORDER — SODIUM CHLORIDE 0.9 % IV SOLN
12.5000 mg | Freq: Four times a day (QID) | INTRAVENOUS | Status: DC | PRN
  Administered 2022-05-10: 12.5 mg via INTRAVENOUS
  Filled 2022-05-10: qty 12.5

## 2022-05-10 NOTE — Progress Notes (Signed)
Progress Note   Patient: Matthew Zamora DOB: [birth year] DOA: 04/17/2022     22 DOS: the patient was seen and examined on 05/10/2022   Brief hospital course: Matthew Zamora is a 81 y.o. male with medical history significant of hypertension, hyperlipidemia, COPD, APF on Eliquis, CHF with EF of 35-40%, CAD, anemia, CKD-3B, frequent fall, who presented on 04/17/2022 for evaluation of dizziness and lightheadedness, generalized weakness, near syncopal episodes.  This occurred in the setting of about one week of GI illness with nausea and vomiting.  In the ED, afebrile and stable vitals.   Labs notable for K 5.5, no leukocytosis, normal lactic acid x 2, AST 129, ALT 136, Tbili 1.4.  BNP mildly elevated 354.5, troponin x 2 normal, negative for Covid-19 and flu A/B.  Admitted to medicine service for further evaluation and management.    11/2: Matthew Zamora with positive orthostatic vitals with severe symptoms of dizziness, unable to tolerate being upright.  Remains with poor PO intake, started on IV fluids.  11/3--5: ongoing N/V, dizziness. GI and cardiology consulted.  IV fluids stopped 11/4 due to dyspnea and cxr with mild vascular congestion.  Eliquis held for EGD. 11/6: EGD showed erosive esophagitis, submucosal duodenal nodule - biopsies benign.  Eliquis resumed.  Hospital course has been prolonged and complicated by worsened acute respiratory failure with hypoxia, increased O2 requirements.  Given intermittent diuresis as BP allows.  Matthew Zamora had multifocal pneumonia, completed course of antibiotics.  Matthew Zamora later had E. Coli infection with diarrhea which has resolved.    Matthew Zamora has persistently had positive orthostatic BP's, ongoing dizziness when upright.  Matthew Zamora has been unable to participate in Matthew Zamora.    Matthew Zamora continues to have intermittent nausea/vomiting some days but appears in tolerating adequate PO intake.  Prognosis overall is guarded.  Patient remains high risk for clinical deterioration and  readmission.  11/22: Continues to be very orthostatic on Matthew Zamora evaluation  11/23: Increased dose of midodrine to 15 mg 3 times daily and added Reglan 11/24: Nauseous and dizzy  Assessment and Plan: * Near syncope Persistent orthostatic hypotension Initially attributed to dehydration in the setting of 1 week nausea vomiting.  Orthostatic vitals were positive and have remained so despite fluids, midodrine, TED hose, binder.  -- Treated with IV fluids initially due to profound dehydration.  Off fluids and has required intermittent diuresis --Daily orthostatic vitals - remain positive --Monitor on telemetry --Supportive care for GI illness as outlined  11/18--22: still +orthostatics, symptomatic, Matthew Zamora cannot tolerate upright position.  No improvement with TED hose and abdominal binder. --Re-consulted [facility] cardiology for possible medication adjustments.  They recommended getting EP's input --Discussed case with Dr. Caryl Comes on 11/21.  Concern for autonomic failure given heart rate does not compensate for orthostatic drop in blood pressure.  Matthew Zamora recommended stopping amiodarone and mexiletine.  Indicated midodrine can be used above max dose and sometimes used as high as 20 mg QID.   Also recommended maintaining patient in reverse Trendelenburg (feet down) to encourage it autonomic nervous system to compensate for gravity on a more regular basis.  Abdominal binder only when upright, thigh sleeves likely more helpful than TED hose on the distal lower extremities.  Fludrocortisone is a poor option in this heart failure patient who is very fluid sensitive requiring IV diuresis at times.  Pyridostigmine only works when vertical and upright, can be continued.  Dr. Caryl Comes encouraged to reach out if any further questions. -- Dr. Luana Shu his EP at [facility].  Dyspnea Acute respiratory failure with hypoxia likely due to pneumonia and CHF/fluid overload. Pneumonia has since resolved. --Supplement O2 if sats < 90% or PRN for  comfort -- Has required intermittent gentle diuresis, very fluid sensitive -- Matthew Zamora is weaned off to room air now.  Nausea & vomiting Patient reported 1 week history of nausea vomiting at home, leading to dehydration and near syncopal episodes with dizziness lightheadedness. Family report N/V issues frequently for several months, chronic issue. -- IV fluids have been stopped due to recurrent dyspnea --IV antiemetics as needed --Monitor renal function and electrolytes --GI consulted, EGD done 11/6 (see erosive esophagitis). -No response to Zofran and Reglan.  Added Phenergan for persistent nausea  NSVT (nonsustained ventricular tachycardia) ([place]) Patient has AICD in place. --[facility] cardiology, they now recommend EP input with [place].  See Near Syncope. --Has been on amiodarone, mexiletine in past.   --There is drug interaction with Amio increasing effects of mexiletine, side effects of mexiletine are similar patient's presenting symptoms. 11/21 EP Dr. Caryl Comes recommended stopping these as the combination would explain patient's persistent symptoms, currently d/c'd orders for these.  Outpatient evaluation with EP at [facility], Dr. Luana Shu --Monitor on telemetry. --Maintain K above 4, mag above 2  Orthostatic hypotension Persistent.  Continues to be extremely dizzy and lightheaded whenever in upright position. See orthostatic vitals in Matthew Zamora notes.  Matthew Zamora continues to be severely orthostatic --Daily orthostatic vitals --Fall precautions --Started midodrine & dose was up-titrated to 15 mg TID on 11/23 (can consider increasing above max dose, as much as 20 mg).   --TED hose and Abdominal binder when upright --Try reverse trendelenburg bed position (at all times or at least for awhile prior to attempting upright)  Abnormal LFTs On admission AST 129, ALT 136, total bili 1.4. RUQ ultrasound showed normal gallbladder and CBD, stable complex cyst in the right lobe of liver.  Suspect due to GI illness, also likely  congestive hepatopathy.   Acute hepatitis panel negative. -- Monitor LFTs  --GI consulted, signed off AST/ALT improving  Hyperkalemia K was 5.5 on admission, now normalized. Maintain K>4 given hx of dysrhythmias  CKD (chronic kidney disease), stage IIIb Baseline creatinine 1.7-1.8.  Renal function on admission was at baseline. Creatinine trended up in setting of dehydration with intractable N/V, low BP's.   Cr stable, near baseline --Treated with IV fluids --Daily BMP to monitor --Avoid nephrotoxins, hypotension --Renally dose meds.  Chronic systolic CHF (congestive heart failure) ([place]) On admission, hypovolemic and dehydrated. Diuretics on hold.  Was on IV fluids due to profound dehydration.  Matthew Zamora developed worsening dyspnea and BNP was elevated, fluids were stopped and Matthew Zamora was gently diuresed. 11/15--21: Gave IV Lasix a couple times this week due to increased O2 requirement up to 4 L after IV fluids were used in the setting of persistent diarrhea and ongoing nausea vomiting.  GI symptoms have improved and patient is tolerating p.o. intake fairly well.  Now stable on room air -- Intermittent diuresis as needed (appears Matthew Zamora takes PRN torsemide at home) --Started on midodrine, continue --Metoprolol discontinued due to soft BP and severe orthostasis --Daily weights Net IO Since Admission: -11,744.71 mL [05/10/22 1327]  --Monitor closely -- [facility] cardiology is following.    Paroxysmal atrial fibrillation ([name]) Rate controlled. --Holding amiodarone, Mexitil after EP discussion as outlined. --Oral metoprolol stopped due to low BP's --On Eliquis --Monitor on telemetry --Lopressor IV PRN for HR sustained above 110.  Cardiology following  Essential hypertension Hold metoprolol given soft BPs and severe  orthostatic symptoms Diuretic on hold IV hydralazine as needed  Now on midodrine and uptitrating dose of midodrine.  On 15 mg 3 times daily  Hyperlipidemia Lipitor on hold due to  elevated LFTs.  CAD (coronary artery disease) Stable, no active chest pain. Continue ASA, metoprolol. Lipitor on hold due to elevated LFTs  COPD (chronic obstructive pulmonary disease) ([name]) Stable without wheezing. Continue bronchodilators.  Normocytic anemia Hemoglobin stable Monitor CBC.  Protein-calorie malnutrition, severe Complicates overall prognosis  Enteritis, enteropathogenic E. coli See Diarrhea  Diarrhea Stool studies positive for enteropathogentic E. Coli.  Treated with Xifaxan.  Diarrhea resolved  Multifocal pneumonia CXR showed bilateral opacities concerning for PNA.  Concern Matthew Zamora maybe aspirated during intractable N/V and dry heaving.   --Completed course of Rocephin --Pulmonary hygiene --Mucinex and other supportive care PRN -- Matthew Zamora is on room air now  Erosive esophagitis EGD on 11/6 notable for LA grade D esophagitis with no bleeding seen. --Biopsy path reviewed, benign --Continue PPI BID indefinitely -- Continue Carafate --Follow up in GI clinic as an outpatient  Intractable nausea and vomiting Chronic x several months. See Nausea & Vomiting, Erosive esophagitis EGD on 11/5 as above Follow up with GI as outpatient. Reglan and Zofran did not help much.  Added Phenergan  Dizziness See Near Syncope  Transaminitis See abnormal LFTs  Hyponatremia Likely hypovolemic in setting of N/V and dehydration.  Sodium 134 today  Postural dizziness with near syncope Remains ongoing challenge.  Increased dose of midodrine on 11/23  Generalized weakness Due to recurrent N/V x several months, unable to get adequate calories / nutrition. --Matthew Zamora/OT recommending SNF. --TOC following for placement, patient had insurance Auth but due to his ongoing symptomatic orthostatic hypotension I do not think Matthew Zamora is stable for discharge medically --Fall precautions.        Subjective: Patient reports some nausea.  Matthew Zamora would like to ambulate if possible but afraid of  feeling dizzy  Physical Exam: Vitals:   05/10/22 0049 05/10/22 0504 05/10/22 0842 05/10/22 1251  BP: (!) 132/56 126/74 112/75 (!) 141/65  Pulse: 61 64 62 63  Resp: '17 17  18  '$ Temp: 98 F (36.7 C) 97.9 F (36.6 C) 98.2 F (36.8 C) 98.4 F (36.9 C)  TempSrc: Oral Oral Oral   SpO2: 97% 98% 100% 99%  Weight:      Height:       81 year old underweight frail-appearing male lying in the bed comfortably without any acute distress Matthew Zamora has temporal wasting Lungs clear to auscultation bilaterally Cardiovascular regular rate and rhythm Abdomen soft, benign Neuro alert and awake, nonfocal Psych normal mood and affect Data Reviewed:  There are no new results to review at this time.  Family Communication: None   Disposition: Status is: Inpatient Remains inpatient appropriate because: Remains very orthostatic  Planned Discharge Destination: Skilled nursing facility   DVT prophylaxis-Eliquis Time spent: 35 minutes  Author: Max Sane, MD 05/10/2022 1:27 PM  For on call review [email or web address].

## 2022-05-10 NOTE — Assessment & Plan Note (Signed)
Due to recurrent N/V x several months, unable to get adequate calories / nutrition. --PT/OT recommending SNF. --TOC following for placement, patient had insurance Auth but due to his ongoing symptomatic orthostatic hypotension I do not think he is stable for discharge medically --Fall precautions.

## 2022-05-10 NOTE — Assessment & Plan Note (Signed)
See abnormal LFTs

## 2022-05-10 NOTE — Assessment & Plan Note (Signed)
CXR showed bilateral opacities concerning for PNA.  Concern he maybe aspirated during intractable N/V and dry heaving.   --Completed course of Rocephin --Pulmonary hygiene --Mucinex and other supportive care PRN -- He is on room air now

## 2022-05-10 NOTE — Assessment & Plan Note (Signed)
On admission AST 129, ALT 136, total bili 1.4. RUQ ultrasound showed normal gallbladder and CBD, stable complex cyst in the right lobe of liver.  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   Acute hepatitis panel negative. -- Monitor LFTs  --GI consulted, signed off AST/ALT improving

## 2022-05-10 NOTE — Assessment & Plan Note (Signed)
Patient reported 1 week history of nausea vomiting at home, leading to dehydration and near syncopal episodes with dizziness lightheadedness. Family report N/V issues frequently for several months, chronic issue. -- IV fluids have been stopped due to recurrent dyspnea --IV antiemetics as needed --Monitor renal function and electrolytes --GI consulted, EGD done 11/6 (see erosive esophagitis). -No response to Zofran and Reglan.  Added Phenergan for persistent nausea

## 2022-05-10 NOTE — Assessment & Plan Note (Signed)
Acute respiratory failure with hypoxia likely due to pneumonia and CHF/fluid overload. Pneumonia has since resolved. --Supplement O2 if sats < 90% or PRN for comfort -- Has required intermittent gentle diuresis, very fluid sensitive -- He is weaned off to room air now.

## 2022-05-10 NOTE — Assessment & Plan Note (Signed)
Persistent.  Continues to be extremely dizzy and lightheaded whenever in upright position. See orthostatic vitals in PT notes.  He continues to be severely orthostatic --Daily orthostatic vitals --Fall precautions --Started midodrine & dose was up-titrated to 15 mg TID on 11/23 (can consider increasing above max dose, as much as 20 mg).   --TED hose and Abdominal binder when upright --Try reverse trendelenburg bed position (at all times or at least for awhile prior to attempting upright)

## 2022-05-10 NOTE — Assessment & Plan Note (Addendum)
Remains ongoing challenge.  Increased dose of midodrine on 11/23

## 2022-05-10 NOTE — Progress Notes (Signed)
Nutrition Follow-up  DOCUMENTATION CODES:   Severe malnutrition in context of chronic illness, Underweight  INTERVENTION:   -Continue with regular diet -Continue MVI with minerals daily -Continue Boost Breeze po TID, each supplement provides 250 kcal and 9 grams of protein  -Continue 30 ml Prosource Plus TID, each supplement provides 100 kcals and 15 grams protein -Continue Magic cup TID with meals, each supplement provides 290 kcal and 9 grams of protein   NUTRITION DIAGNOSIS:   Severe Malnutrition related to chronic illness (CHF) as evidenced by severe fat depletion, severe muscle depletion, percent weight loss.  Ongoing  GOAL:   Patient will meet greater than or equal to 90% of their needs  Progressing   MONITOR:   PO intake, Supplement acceptance  REASON FOR ASSESSMENT:   Consult Assessment of nutrition requirement/status  ASSESSMENT:   Pt with medical history significant of hypertension, hyperlipidemia, COPD, APF on Eliquis, CHF with EF of 35-40%, CAD, anemia, CKD-3B, frequent fall, who presented on 04/17/2022 for evaluation of dizziness and lightheadedness, generalized weakness, near syncopal episodes.  Reviewed I/O's: -100 ml x 24 hours and -6.6 L since 04/26/22  UOP: 100 ml x 24 hours   Per palliative care notes, pt remains with nausea and vomiting, Zofran has not worked for him and compazine has been d/c.   Pt lying in bed. He did not respond to name being called. Noted emesis bag near pt. Observed breakfast tray- pt consumed about about 25%of milk. He has been refusing supplements.   Plan for SNF once medically stable. TOC has submitted another authorization.   Medications reviewed and include vitamin C, magnesium oxide, senokot, and phenergan.   Labs reviewed: Na: 134.    Diet Order:   Diet Order             Diet regular Room service appropriate? Yes; Fluid consistency: Thin  Diet effective now                   EDUCATION NEEDS:    Education needs have been addressed  Skin:  Skin Assessment: Reviewed RN Assessment  Last BM:  05/10/22  Height:   Ht Readings from Last 1 Encounters:  04/17/22 '5\' 8"'$  (1.727 m)    Weight:   Wt Readings from Last 1 Encounters:  05/08/22 54.1 kg    Ideal Body Weight:  63.6 kg  BMI:  Body mass index is 18.13 kg/m.  Estimated Nutritional Needs:   Kcal:  1850-2050  Protein:  105-120 grams  Fluid:  > 1.8 L    Loistine Chance, RD, LDN, [place] Registered Dietitian II Certified Diabetes Care and Education Specialist Please refer to [facility] for RD and/or RD on-call/weekend/after hours pager

## 2022-05-10 NOTE — Progress Notes (Signed)
PT Cancellation Note  Patient Details Name: Matthew Zamora MRN: [id number] DOB: March 13, 1941   Cancelled Treatment:     PT attempt. Pt was side lying in bed holding emesis bag upon arrival. Pt is usually extremely motivated and willing to participate however endorsing feeling very poorly right now. Acute PT will continue to work with pt until [facility] to [place].   Willette Pa 05/10/2022, 3:58 PM

## 2022-05-10 NOTE — TOC Progression Note (Addendum)
Transition of Care [facility]) - Progression Note    Patient Details  Name: Matthew Zamora MRN: [id number] Date of Birth: 04/02/1941  Transition of Care [facility]) CM/SW [place], [place] Phone Number: 05/10/2022, 1:36 PM  Clinical Narrative:     [facility] is submitting another authorization for this patient as the authorization has expired.  TOC submitted authorization. Auth ID [id number].  TOC sent [facility] an update.  Expected Discharge Plan: [place] Barriers to Discharge: Continued Medical Work up  Expected Discharge Plan and Services Expected Discharge Plan: [place]   Discharge Planning Services: CM Consult                                           Social Determinants of Health (SDOH) Interventions    Readmission Risk Interventions    04/22/2022    3:54 PM  Readmission Risk Prevention Plan  Transportation Screening Complete  Medication Review ([place]) Complete  PCP or Specialist appointment within 3-5 days of discharge Complete  HRI or [place] Complete  SW Recovery Care/Counseling Consult Complete  [place] Not Applicable

## 2022-05-10 NOTE — Assessment & Plan Note (Signed)
Chronic x several months. See Nausea & Vomiting, Erosive esophagitis EGD on 11/5 as above Follow up with GI as outpatient. Reglan and Zofran did not help much.  Added Phenergan

## 2022-05-10 NOTE — Assessment & Plan Note (Signed)
Persistent orthostatic hypotension Initially attributed to dehydration in the setting of 1 week nausea vomiting.  Orthostatic vitals were positive and have remained so despite fluids, midodrine, TED hose, binder.  -- Treated with IV fluids initially due to profound dehydration.  Off fluids and has required intermittent diuresis --Daily orthostatic vitals - remain positive --Monitor on telemetry --Supportive care for GI illness as outlined  11/18--22: still +orthostatics, symptomatic, pt cannot tolerate upright position.  No improvement with TED hose and abdominal binder. --Re-consulted [facility] cardiology for possible medication adjustments.  They recommended getting EP's input --Discussed case with Dr. Caryl Comes on 11/21.  Concern for autonomic failure given heart rate does not compensate for orthostatic drop in blood pressure.  He recommended stopping amiodarone and mexiletine.  Indicated midodrine can be used above max dose and sometimes used as high as 20 mg QID.   Also recommended maintaining patient in reverse Trendelenburg (feet down) to encourage it autonomic nervous system to compensate for gravity on a more regular basis.  Abdominal binder only when upright, thigh sleeves likely more helpful than TED hose on the distal lower extremities.  Fludrocortisone is a poor option in this heart failure patient who is very fluid sensitive requiring IV diuresis at times.  Pyridostigmine only works when vertical and upright, can be continued.  Dr. Caryl Comes encouraged to reach out if any further questions. -- Dr. Luana Shu his EP at [facility].

## 2022-05-10 NOTE — Assessment & Plan Note (Signed)
Patient has AICD in place. --[facility] cardiology, they now recommend EP input with [place].  See Near Syncope. --Has been on amiodarone, mexiletine in past.   --There is drug interaction with Amio increasing effects of mexiletine, side effects of mexiletine are similar patient's presenting symptoms. 11/21 EP Dr. Caryl Comes recommended stopping these as the combination would explain patient's persistent symptoms, currently d/c'd orders for these.  Outpatient evaluation with EP at [facility], Dr. Luana Shu --Monitor on telemetry. --Maintain K above 4, mag above 2

## 2022-05-10 NOTE — Assessment & Plan Note (Signed)
Stool studies positive for enteropathogentic E. Coli.  Treated with Xifaxan.  Diarrhea resolved

## 2022-05-10 NOTE — Assessment & Plan Note (Signed)
Hold metoprolol given soft BPs and severe orthostatic symptoms Diuretic on hold IV hydralazine as needed  Now on midodrine and uptitrating dose of midodrine.  On 15 mg 3 times daily

## 2022-05-10 NOTE — Assessment & Plan Note (Signed)
Likely hypovolemic in setting of N/V and dehydration.  Sodium 134 today

## 2022-05-10 NOTE — Assessment & Plan Note (Signed)
Hemoglobin stable Monitor CBC.

## 2022-05-10 NOTE — Assessment & Plan Note (Signed)
Lipitor on hold due to elevated LFTs.

## 2022-05-10 NOTE — Assessment & Plan Note (Signed)
Baseline creatinine 1.7-1.8.  Renal function on admission was at baseline. Creatinine trended up in setting of dehydration with intractable N/V, low BP's.   Cr stable, near baseline --Treated with IV fluids --Daily BMP to monitor --Avoid nephrotoxins, hypotension --Renally dose meds.

## 2022-05-10 NOTE — Progress Notes (Signed)
OT Cancellation Note  Patient Details Name: JOTHAM AHN MRN: [id number] DOB: [birth year]   Cancelled Treatment:    Reason Eval/Treat Not Completed: Other (comment). Per PT, pt not feeling well, holding emesis bag. Will re-attempt OT tx at later date/time as pt is able to tolerate.   Ardeth Perfect., MPH, MS, OTR/L ascom [phone] 05/10/22, 3:48 PM

## 2022-05-10 NOTE — Assessment & Plan Note (Signed)
Rate controlled. --Holding amiodarone, Mexitil after EP discussion as outlined. --Oral metoprolol stopped due to low BP's --On Eliquis --Monitor on telemetry --Lopressor IV PRN for HR sustained above 110.  Cardiology following

## 2022-05-10 NOTE — Assessment & Plan Note (Signed)
K was 5.5 on admission, now normalized. Maintain K>4 given hx of dysrhythmias

## 2022-05-10 NOTE — Assessment & Plan Note (Signed)
EGD on 11/6 notable for LA grade D esophagitis with no bleeding seen. --Biopsy path reviewed, benign --Continue PPI BID indefinitely -- Continue Carafate --Follow up in GI clinic as an outpatient

## 2022-05-10 NOTE — Assessment & Plan Note (Signed)
Complicates overall prognosis. 

## 2022-05-10 NOTE — Assessment & Plan Note (Signed)
Stable without wheezing. Continue bronchodilators.

## 2022-05-10 NOTE — Assessment & Plan Note (Signed)
Stable, no active chest pain. Continue ASA, metoprolol. Lipitor on hold due to elevated LFTs

## 2022-05-10 NOTE — Assessment & Plan Note (Signed)
On admission, hypovolemic and dehydrated. Diuretics on hold.  Was on IV fluids due to profound dehydration.  He developed worsening dyspnea and BNP was elevated, fluids were stopped and he was gently diuresed. 11/15--21: Gave IV Lasix a couple times this week due to increased O2 requirement up to 4 L after IV fluids were used in the setting of persistent diarrhea and ongoing nausea vomiting.  GI symptoms have improved and patient is tolerating p.o. intake fairly well.  Now stable on room air -- Intermittent diuresis as needed (appears he takes PRN torsemide at home) --Started on midodrine, continue --Metoprolol discontinued due to soft BP and severe orthostasis --Daily weights Net IO Since Admission: -11,744.71 mL [05/10/22 1327]  --Monitor closely -- [facility] cardiology is following.

## 2022-05-11 ENCOUNTER — Inpatient Hospital Stay: Payer: Medicare Other

## 2022-05-11 DIAGNOSIS — E871 Hypo-osmolality and hyponatremia: Secondary | ICD-10-CM

## 2022-05-11 DIAGNOSIS — E43 Unspecified severe protein-calorie malnutrition: Secondary | ICD-10-CM | POA: Diagnosis not present

## 2022-05-11 DIAGNOSIS — R55 Syncope and collapse: Secondary | ICD-10-CM | POA: Diagnosis not present

## 2022-05-11 DIAGNOSIS — R112 Nausea with vomiting, unspecified: Secondary | ICD-10-CM | POA: Diagnosis not present

## 2022-05-11 NOTE — Assessment & Plan Note (Signed)
Persistent orthostatic hypotension Initially attributed to dehydration in the setting of 1 week nausea vomiting.  Orthostatic vitals were positive and have remained so despite fluids, midodrine, TED hose, binder.  -- Treated with IV fluids initially due to profound dehydration.  Off fluids and has required intermittent diuresis --Daily orthostatic vitals - remain positive --Monitor on telemetry --Supportive care for GI illness as outlined  11/18--22: still +orthostatics, symptomatic, pt cannot tolerate upright position.  No improvement with TED hose and abdominal binder. --Re-consulted [facility] cardiology for possible medication adjustments.  They recommended getting EP's input --Discussed case with Dr. Caryl Comes on 11/21.  Concern for autonomic failure given heart rate does not compensate for orthostatic drop in blood pressure.  He recommended stopping amiodarone and mexiletine.  Indicated midodrine can be used above max dose and sometimes used as high as 20 mg QID.   Also recommended maintaining patient in reverse Trendelenburg (feet down) to encourage it autonomic nervous system to compensate for gravity on a more regular basis.  Abdominal binder only when upright, thigh sleeves likely more helpful than TED hose on the distal lower extremities.  Fludrocortisone is a poor option in this heart failure patient who is very fluid sensitive requiring IV diuresis at times.  Pyridostigmine only works when vertical and upright, can be continued.  Dr. Caryl Comes encouraged to reach out if any further questions. -- Dr. Luana Shu his EP at [facility].

## 2022-05-11 NOTE — Assessment & Plan Note (Signed)
Hold metoprolol given soft BPs and severe orthostatic symptoms Diuretic on hold IV hydralazine as needed  Now on midodrine and uptitrating dose of midodrine.  On 15 mg 3 times daily

## 2022-05-11 NOTE — Assessment & Plan Note (Signed)
Lipitor on hold due to elevated LFTs.

## 2022-05-11 NOTE — Assessment & Plan Note (Signed)
Persistent.  Continues to be extremely dizzy and lightheaded whenever in upright position. See orthostatic vitals in PT notes.  He continues to be severely orthostatic --Daily orthostatic vitals --Fall precautions --Started midodrine & dose was up-titrated to 15 mg TID on 11/23 (can consider increasing above max dose, as much as 20 mg possibly tomorrow if need).   --TED hose and Abdominal binder when upright --Try reverse trendelenburg bed position (at all times or at least for awhile prior to attempting upright)

## 2022-05-11 NOTE — Assessment & Plan Note (Signed)
Hemoglobin stable Monitor CBC.

## 2022-05-11 NOTE — Assessment & Plan Note (Addendum)
Chronic x several months. See Nausea & Vomiting, Erosive esophagitis EGD on 11/5 as above Follow up with GI as outpatient. Reglan and Zofran did not help much.  Added Phenergan Less nauseous today

## 2022-05-11 NOTE — Assessment & Plan Note (Signed)
Likely hypovolemic in setting of N/V and dehydration.

## 2022-05-11 NOTE — Assessment & Plan Note (Signed)
On admission AST 129, ALT 136, total bili 1.4. RUQ ultrasound showed normal gallbladder and CBD, stable complex cyst in the right lobe of liver.  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   Acute hepatitis panel negative. -- Monitor LFTs  --GI consulted, signed off AST/ALT improving

## 2022-05-11 NOTE — Assessment & Plan Note (Signed)
Rate controlled. --Holding amiodarone, Mexitil after EP discussion as outlined. --Oral metoprolol stopped due to low BP's --On Eliquis --Monitor on telemetry --Lopressor IV PRN for HR sustained above 110.  Cardiology following

## 2022-05-11 NOTE — Assessment & Plan Note (Signed)
Stable without wheezing. Continue bronchodilators.

## 2022-05-11 NOTE — Assessment & Plan Note (Signed)
Patient has AICD in place. --[facility] cardiology, they now recommend EP input with [place].  See Near Syncope. --Has been on amiodarone, mexiletine in past.   --There is drug interaction with Amio increasing effects of mexiletine, side effects of mexiletine are similar patient's presenting symptoms. 11/21 EP Dr. Caryl Comes recommended stopping these as the combination would explain patient's persistent symptoms, currently d/c'd orders for these.  Outpatient evaluation with EP at [facility], Dr. Luana Shu --Monitor on telemetry. --Maintain K above 4, mag above 2

## 2022-05-11 NOTE — Assessment & Plan Note (Signed)
Stool studies positive for enteropathogentic E. Coli.  Treated with Xifaxan.  Diarrhea resolved

## 2022-05-11 NOTE — Assessment & Plan Note (Signed)
On admission, hypovolemic and dehydrated. Diuretics on hold.  Was on IV fluids due to profound dehydration.  He developed worsening dyspnea and BNP was elevated, fluids were stopped and he was gently diuresed. 11/15--21: Gave IV Lasix a couple times this week due to increased O2 requirement up to 4 L after IV fluids were used in the setting of persistent diarrhea and ongoing nausea vomiting.  GI symptoms have improved and patient is tolerating p.o. intake fairly well.  Now stable on room air -- Intermittent diuresis as needed (appears he takes PRN torsemide at home) --Started on midodrine, continue --Metoprolol discontinued due to soft BP and severe orthostasis --Daily weights Net IO Since Admission: -12,344.71 mL [05/11/22 1045]  --Monitor closely -- [facility] cardiology is following.

## 2022-05-11 NOTE — Progress Notes (Signed)
Progress Note   Patient: Matthew Zamora PXT:[id number] DOB: 08/11/1940 DOA: 04/17/2022     23 DOS: the patient was seen and examined on 05/11/2022   Brief hospital course: LAMARCO GUDIEL is a 81 y.o. male with medical history significant of hypertension, hyperlipidemia, COPD, APF on Eliquis, CHF with EF of 35-40%, CAD, anemia, CKD-3B, frequent fall, who presented on 04/17/2022 for evaluation of dizziness and lightheadedness, generalized weakness, near syncopal episodes.  This occurred in the setting of about one week of GI illness with nausea and vomiting.  In the ED, afebrile and stable vitals.   Labs notable for K 5.5, no leukocytosis, normal lactic acid x 2, AST 129, ALT 136, Tbili 1.4.  BNP mildly elevated 354.5, troponin x 2 normal, negative for Covid-19 and flu A/B.  Admitted to medicine service for further evaluation and management.    11/2: pt with positive orthostatic vitals with severe symptoms of dizziness, unable to tolerate being upright.  Remains with poor PO intake, started on IV fluids.  11/3--5: ongoing N/V, dizziness. GI and cardiology consulted.  IV fluids stopped 11/4 due to dyspnea and cxr with mild vascular congestion.  Eliquis held for EGD. 11/6: EGD showed erosive esophagitis, submucosal duodenal nodule - biopsies benign.  Eliquis resumed.  Hospital course has been prolonged and complicated by worsened acute respiratory failure with hypoxia, increased O2 requirements.  Given intermittent diuresis as BP allows.  He had multifocal pneumonia, completed course of antibiotics.  He later had E. Coli infection with diarrhea which has resolved.    Pt has persistently had positive orthostatic BP's, ongoing dizziness when upright.  He has been unable to participate in PT.    He continues to have intermittent nausea/vomiting some days but appears in tolerating adequate PO intake.  Prognosis overall is guarded.  Patient remains high risk for clinical deterioration and  readmission.  11/22: Continues to be very orthostatic on PT evaluation  11/23: Increased dose of midodrine to 15 mg 3 times daily and added Reglan 11/24: Nauseous and dizzy 11/25: Hoping to work with therapy.  Less nauseous today.  Slept well.  CT chest to evaluate for underlying lung nodule  Assessment and Plan: * Near syncope Persistent orthostatic hypotension Initially attributed to dehydration in the setting of 1 week nausea vomiting.  Orthostatic vitals were positive and have remained so despite fluids, midodrine, TED hose, binder.  -- Treated with IV fluids initially due to profound dehydration.  Off fluids and has required intermittent diuresis --Daily orthostatic vitals - remain positive --Monitor on telemetry --Supportive care for GI illness as outlined  11/18--22: still +orthostatics, symptomatic, pt cannot tolerate upright position.  No improvement with TED hose and abdominal binder. --Re-consulted [facility] cardiology for possible medication adjustments.  They recommended getting EP's input --Discussed case with Dr. Caryl Comes on 11/21.  Concern for autonomic failure given heart rate does not compensate for orthostatic drop in blood pressure.  He recommended stopping amiodarone and mexiletine.  Indicated midodrine can be used above max dose and sometimes used as high as 20 mg QID.   Also recommended maintaining patient in reverse Trendelenburg (feet down) to encourage it autonomic nervous system to compensate for gravity on a more regular basis.  Abdominal binder only when upright, thigh sleeves likely more helpful than TED hose on the distal lower extremities.  Fludrocortisone is a poor option in this heart failure patient who is very fluid sensitive requiring IV diuresis at times.  Pyridostigmine only works when vertical and upright, can be  continued.  Dr. Caryl Comes encouraged to reach out if any further questions. -- Dr. Luana Shu his EP at [facility].    Dyspnea Acute respiratory failure with hypoxia  likely due to pneumonia and CHF/fluid overload. Pneumonia has since resolved. --Supplement O2 if sats < 90% or PRN for comfort -- Has required intermittent gentle diuresis, very fluid sensitive -- He is weaned off to room air now.  NSVT (nonsustained ventricular tachycardia) ([place]) Patient has AICD in place. --[facility] cardiology, they now recommend EP input with [place].  See Near Syncope. --Has been on amiodarone, mexiletine in past.   --There is drug interaction with Amio increasing effects of mexiletine, side effects of mexiletine are similar patient's presenting symptoms. 11/21 EP Dr. Caryl Comes recommended stopping these as the combination would explain patient's persistent symptoms, currently d/c'd orders for these.  Outpatient evaluation with EP at [facility], Dr. Luana Shu --Monitor on telemetry. --Maintain K above 4, mag above 2  Nausea & vomiting-resolved as of 05/11/2022 Patient reported 1 week history of nausea vomiting at home, leading to dehydration and near syncopal episodes with dizziness lightheadedness. Family report N/V issues frequently for several months, chronic issue. -- IV fluids have been stopped due to recurrent dyspnea --IV antiemetics as needed --Monitor renal function and electrolytes --GI consulted, EGD done 11/6 (see erosive esophagitis). -No response to Zofran and Reglan.  Added Phenergan for persistent nausea  Orthostatic hypotension Persistent.  Continues to be extremely dizzy and lightheaded whenever in upright position. See orthostatic vitals in PT notes.  He continues to be severely orthostatic --Daily orthostatic vitals --Fall precautions --Started midodrine & dose was up-titrated to 15 mg TID on 11/23 (can consider increasing above max dose, as much as 20 mg possibly tomorrow if need).   --TED hose and Abdominal binder when upright --Try reverse trendelenburg bed position (at all times or at least for awhile prior to attempting upright)  Abnormal LFTs On admission AST  129, ALT 136, total bili 1.4. RUQ ultrasound showed normal gallbladder and CBD, stable complex cyst in the right lobe of liver.  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   Acute hepatitis panel negative. -- Monitor LFTs  --GI consulted, signed off AST/ALT improving  Hyperkalemia K was 5.5 on admission, now normalized. Maintain K>4 given hx of dysrhythmias  CKD (chronic kidney disease), stage IIIb Baseline creatinine 1.7-1.8.  Renal function on admission was at baseline. Creatinine trended up in setting of dehydration with intractable N/V, low BP's.   Cr stable, near baseline --Treated with IV fluids --Daily BMP to monitor --Avoid nephrotoxins, hypotension --Renally dose meds.  Chronic systolic CHF (congestive heart failure) ([place]) On admission, hypovolemic and dehydrated. Diuretics on hold.  Was on IV fluids due to profound dehydration.  He developed worsening dyspnea and BNP was elevated, fluids were stopped and he was gently diuresed. 11/15--21: Gave IV Lasix a couple times this week due to increased O2 requirement up to 4 L after IV fluids were used in the setting of persistent diarrhea and ongoing nausea vomiting.  GI symptoms have improved and patient is tolerating p.o. intake fairly well.  Now stable on room air -- Intermittent diuresis as needed (appears he takes PRN torsemide at home) --Started on midodrine, continue --Metoprolol discontinued due to soft BP and severe orthostasis --Daily weights Net IO Since Admission: -12,344.71 mL [05/11/22 1045]  --Monitor closely -- [facility] cardiology is following.    Paroxysmal atrial fibrillation ([name]) Rate controlled. --Holding amiodarone, Mexitil after EP discussion as outlined. --Oral metoprolol stopped due  to low BP's --On Eliquis --Monitor on telemetry --Lopressor IV PRN for HR sustained above 110.  Cardiology following  Essential hypertension Hold metoprolol given soft BPs and severe orthostatic  symptoms Diuretic on hold IV hydralazine as needed  Now on midodrine and uptitrating dose of midodrine.  On 15 mg 3 times daily  Hyperlipidemia Lipitor on hold due to elevated LFTs.  CAD (coronary artery disease) Stable, no active chest pain. Continue ASA, metoprolol. Lipitor on hold due to elevated LFTs  COPD (chronic obstructive pulmonary disease) ([name]) Stable without wheezing. Continue bronchodilators.  Normocytic anemia Hemoglobin stable Monitor CBC.  Protein-calorie malnutrition, severe Complicates overall prognosis.  Enteritis, enteropathogenic E. coli See Diarrhea  Diarrhea Stool studies positive for enteropathogentic E. Coli.  Treated with Xifaxan.  Diarrhea resolved  Multifocal pneumonia CXR showed bilateral opacities concerning for PNA.  Concern he maybe aspirated during intractable N/V and dry heaving.   --Completed course of Rocephin --Pulmonary hygiene --Mucinex and other supportive care PRN -- He is on room air now  Erosive esophagitis EGD on 11/6 notable for LA grade D esophagitis with no bleeding seen. --Biopsy path reviewed, benign --Continue PPI BID indefinitely -- Continue Carafate --Follow up in GI clinic as an outpatient  Intractable nausea and vomiting Chronic x several months. See Nausea & Vomiting, Erosive esophagitis EGD on 11/5 as above Follow up with GI as outpatient. Reglan and Zofran did not help much.  Added Phenergan Less nauseous today  Dizziness See Near Syncope  Hyponatremia Likely hypovolemic in setting of N/V and dehydration.   Postural dizziness with near syncope Remains ongoing challenge.  Increased dose of midodrine on 11/23.  If still symptomatic with orthostatics we could consider increasing tomorrow  Generalized weakness Due to recurrent N/V x several months, unable to get adequate calories / nutrition. --PT/OT recommending SNF. --TOC following for placement, patient had insurance Auth but due to his ongoing  symptomatic orthostatic hypotension I do not think he is stable for discharge medically --Fall precautions.        Subjective: Less nauseous today.  Hopeful to work with therapy  Physical Exam: Vitals:   05/11/22 0006 05/11/22 0144 05/11/22 0525 05/11/22 0834  BP: 131/69  129/62 132/68  Pulse: (!) 59  65 73  Resp: '20  20 17  '$ Temp: 97.8 F (36.6 C)  97.6 F (36.4 C) 98.6 F (37 C)  TempSrc:      SpO2: 99%  98% 97%  Weight:  54.9 kg    Height:       81 year old underweight frail-appearing male lying in the bed comfortably without any acute distress He has temporal wasting Lungs clear to auscultation bilaterally Cardiovascular regular rate and rhythm Abdomen soft, benign Neuro alert and awake, nonfocal Psych normal mood and affect Data Reviewed:  There are no new results to review at this time.  Family Communication: None  Disposition: Status is: Inpatient Remains inpatient appropriate because: Waiting for improvement in orthostatic's and work with therapy  Planned Discharge Destination: Skilled nursing facility   DVT prophylaxis-Eliquis Time spent: 35 minutes  Author: Max Sane, MD 05/11/2022 10:46 AM  For on call review [email or web address].

## 2022-05-11 NOTE — Assessment & Plan Note (Signed)
Acute respiratory failure with hypoxia likely due to pneumonia and CHF/fluid overload. Pneumonia has since resolved. --Supplement O2 if sats < 90% or PRN for comfort -- Has required intermittent gentle diuresis, very fluid sensitive -- He is weaned off to room air now.

## 2022-05-11 NOTE — Assessment & Plan Note (Deleted)
Patient reported 1 week history of nausea vomiting at home, leading to dehydration and near syncopal episodes with dizziness lightheadedness. Family report N/V issues frequently for several months, chronic issue. -- IV fluids have been stopped due to recurrent dyspnea --IV antiemetics as needed --Monitor renal function and electrolytes --GI consulted, EGD done 11/6 (see erosive esophagitis). -No response to Zofran and Reglan.  Added Phenergan for persistent nausea Less nauseous today.  Hoping to work with therapy

## 2022-05-11 NOTE — Assessment & Plan Note (Signed)
Stable, no active chest pain. Continue ASA, metoprolol. Lipitor on hold due to elevated LFTs

## 2022-05-11 NOTE — Assessment & Plan Note (Signed)
Complicates overall prognosis. 

## 2022-05-11 NOTE — Assessment & Plan Note (Signed)
Baseline creatinine 1.7-1.8.  Renal function on admission was at baseline. Creatinine trended up in setting of dehydration with intractable N/V, low BP's.   Cr stable, near baseline --Treated with IV fluids --Daily BMP to monitor --Avoid nephrotoxins, hypotension --Renally dose meds.

## 2022-05-11 NOTE — Assessment & Plan Note (Signed)
Due to recurrent N/V x several months, unable to get adequate calories / nutrition. --PT/OT recommending SNF. --TOC following for placement, patient had insurance Auth but due to his ongoing symptomatic orthostatic hypotension I do not think he is stable for discharge medically --Fall precautions.

## 2022-05-11 NOTE — Assessment & Plan Note (Signed)
K was 5.5 on admission, now normalized. Maintain K>4 given hx of dysrhythmias

## 2022-05-11 NOTE — Assessment & Plan Note (Signed)
EGD on 11/6 notable for LA grade D esophagitis with no bleeding seen. --Biopsy path reviewed, benign --Continue PPI BID indefinitely -- Continue Carafate --Follow up in GI clinic as an outpatient

## 2022-05-11 NOTE — Assessment & Plan Note (Signed)
Remains ongoing challenge.  Increased dose of midodrine on 11/23.  If still symptomatic with orthostatics we could consider increasing tomorrow

## 2022-05-12 DIAGNOSIS — J189 Pneumonia, unspecified organism: Secondary | ICD-10-CM | POA: Diagnosis not present

## 2022-05-12 DIAGNOSIS — R040 Epistaxis: Secondary | ICD-10-CM

## 2022-05-12 DIAGNOSIS — R55 Syncope and collapse: Secondary | ICD-10-CM | POA: Diagnosis not present

## 2022-05-12 DIAGNOSIS — E43 Unspecified severe protein-calorie malnutrition: Secondary | ICD-10-CM | POA: Diagnosis not present

## 2022-05-12 DIAGNOSIS — R42 Dizziness and giddiness: Secondary | ICD-10-CM | POA: Diagnosis not present

## 2022-05-12 NOTE — Assessment & Plan Note (Signed)
On admission AST 129, ALT 136, total bili 1.4. RUQ ultrasound showed normal gallbladder and CBD, stable complex cyst in the right lobe of liver.  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   Acute hepatitis panel negative. -- Monitor LFTs  --GI consulted, signed off AST/ALT improving

## 2022-05-12 NOTE — Progress Notes (Addendum)
Progress Note   Patient: Matthew Zamora DOB: 1941-04-05 DOA: 04/17/2022     24 DOS: the patient was seen and examined on 05/12/2022   Brief hospital course: Matthew Zamora is a 81 y.o. male with medical history significant of hypertension, hyperlipidemia, COPD, APF on Eliquis, CHF with EF of 35-40%, CAD, anemia, CKD-3B, frequent fall, who presented on 04/17/2022 for evaluation of dizziness and lightheadedness, generalized weakness, near syncopal episodes.  This occurred in the setting of about one week of GI illness with nausea and vomiting.  In the ED, afebrile and stable vitals.   Labs notable for K 5.5, no leukocytosis, normal lactic acid x 2, AST 129, ALT 136, Tbili 1.4.  BNP mildly elevated 354.5, troponin x 2 normal, negative for Covid-19 and flu A/B.  Admitted to medicine service for further evaluation and management.    11/2: pt with positive orthostatic vitals with severe symptoms of dizziness, unable to tolerate being upright.  Remains with poor PO intake, started on IV fluids.  11/3--5: ongoing N/V, dizziness. GI and cardiology consulted.  IV fluids stopped 11/4 due to dyspnea and cxr with mild vascular congestion.  Eliquis held for EGD. 11/6: EGD showed erosive esophagitis, submucosal duodenal nodule - biopsies benign.  Eliquis resumed.  Hospital course has been prolonged and complicated by worsened acute respiratory failure with hypoxia, increased O2 requirements.  Given intermittent diuresis as BP allows.  He had multifocal pneumonia, completed course of antibiotics.  He later had E. Coli infection with diarrhea which has resolved.    Pt has persistently had positive orthostatic BP's, ongoing dizziness when upright.  He has been unable to participate in PT.    He continues to have intermittent nausea/vomiting some days but appears in tolerating adequate PO intake.  Prognosis overall is guarded.  Patient remains high risk for clinical deterioration and  readmission.  11/22: Continues to be very orthostatic on PT evaluation  11/23: Increased dose of midodrine to 15 mg 3 times daily and added Reglan 11/24: Nauseous and dizzy 11/25: Hoping to work with therapy.  Less nauseous today.  Slept well.  11/26: CT chest yesterday showed stable pulmonary nodule, mild epistaxis today so holding aspirin and Eliquis.  Hoping to work with therapy today   Assessment and Plan: * Near syncope Persistent orthostatic hypotension Initially attributed to dehydration in the setting of 1 week nausea vomiting.  Orthostatic vitals were positive and have remained so despite fluids, midodrine, TED hose, binder.  -- Treated with IV fluids initially due to profound dehydration.  Off fluids and has required intermittent diuresis --Daily orthostatic vitals - remain positive --Monitor on telemetry --Supportive care for GI illness as outlined  11/18--22: still +orthostatics, symptomatic, pt cannot tolerate upright position.  No improvement with TED hose and abdominal binder. --Re-consulted [facility] cardiology for possible medication adjustments.  They recommended getting EP's input --Discussed case with Dr. Caryl Zamora on 11/21.  Concern for autonomic failure given heart rate does not compensate for orthostatic drop in blood pressure.  He recommended stopping amiodarone and mexiletine.  Indicated midodrine can be used above max dose and sometimes used as high as 20 mg QID.   Also recommended maintaining patient in reverse Trendelenburg (feet down) to encourage it autonomic nervous system to compensate for gravity on a more regular basis.  Abdominal binder only when upright, thigh sleeves likely more helpful than TED hose on the distal lower extremities.  Fludrocortisone is a poor option in this heart failure patient who is very fluid  sensitive requiring IV diuresis at times.  Pyridostigmine only works when vertical and upright, can be continued.  Dr. Caryl Zamora encouraged to reach out if any  further questions. -- Dr. Luana Zamora his EP at [facility].    Dyspnea Acute respiratory failure with hypoxia likely due to pneumonia and CHF/fluid overload. Pneumonia has since resolved. --Supplement O2 if sats < 90% or PRN for comfort -- Has required intermittent gentle diuresis, very fluid sensitive -- He is weaned off to room air now.  NSVT (nonsustained ventricular tachycardia) ([name]) Patient has AICD in place. --[facility] cardiology, they now recommend EP input with [place].  See Near Syncope. --Has been on amiodarone, mexiletine in past.   --There is drug interaction with Amio increasing effects of mexiletine, side effects of mexiletine are similar patient's presenting symptoms. 11/21 EP Dr. Caryl Zamora recommended stopping these as the combination would explain patient's persistent symptoms, currently d/c'd orders for these.  Outpatient evaluation with EP at [facility], Dr. Luana Zamora --Monitor on telemetry. --Maintain K above 4, mag above 2  Nausea & vomiting-resolved as of 05/11/2022 Patient reported 1 week history of nausea vomiting at home, leading to dehydration and near syncopal episodes with dizziness lightheadedness. Family report N/V issues frequently for several months, chronic issue. -- IV fluids have been stopped due to recurrent dyspnea --IV antiemetics as needed --Monitor renal function and electrolytes --GI consulted, EGD done 11/6 (see erosive esophagitis). -No response to Zofran and Reglan.  Added Phenergan for persistent nausea  Orthostatic hypotension Persistent.  Continues to be extremely dizzy and lightheaded whenever in upright position. See orthostatic vitals in PT notes.  He continues to be severely orthostatic --Daily orthostatic vitals --Fall precautions --Started midodrine & dose was up-titrated to 15 mg TID on 11/23 (can consider increasing above max dose, as much as 20 mg possibly tomorrow if need).   --TED hose and Abdominal binder when upright --Try reverse trendelenburg bed position  (at all times or at least for awhile prior to attempting upright) Hoping to work with therapy today  Abnormal LFTs On admission AST 129, ALT 136, total bili 1.4. RUQ ultrasound showed normal gallbladder and CBD, stable complex cyst in the right lobe of liver.  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   Acute hepatitis panel negative. -- Monitor LFTs  --GI consulted, signed off AST/ALT improving  Hyperkalemia K was 5.5 on admission, now normalized. Maintain K>4 given hx of dysrhythmias  CKD (chronic kidney disease), stage IIIb Baseline creatinine 1.7-1.8.  Renal function on admission was at baseline. Creatinine trended up in setting of dehydration with intractable N/V, low BP's.   Cr stable, near baseline --Treated with IV fluids --Daily BMP to monitor --Avoid nephrotoxins, hypotension --Renally dose meds.  Chronic systolic CHF (congestive heart failure) ([place]) On admission, hypovolemic and dehydrated. Diuretics on hold.  Was on IV fluids due to profound dehydration.  He developed worsening dyspnea and BNP was elevated, fluids were stopped and he was gently diuresed. 11/15--21: Gave IV Lasix a couple times this week due to increased O2 requirement up to 4 L after IV fluids were used in the setting of persistent diarrhea and ongoing nausea vomiting.  GI symptoms have improved and patient is tolerating p.o. intake fairly well.  Now stable on room air -- Intermittent diuresis as needed (appears he takes PRN torsemide at home) --Started on midodrine, continue --Metoprolol discontinued due to soft BP and severe orthostasis --Daily weights Net IO Since Admission: -12,844.71 mL [05/12/22 1357]  --Monitor closely -- [facility] cardiology is  following.    Paroxysmal atrial fibrillation ([name]) Rate controlled. --Holding amiodarone, Mexitil after EP discussion as outlined. --Oral metoprolol stopped due to low BP's -- Holding Eliquis as having some epistaxis --Monitor on  telemetry --Lopressor IV PRN for HR sustained above 110.  Cardiology following  Essential hypertension Hold metoprolol given soft BPs and severe orthostatic symptoms Diuretic on hold IV hydralazine as needed  Now on midodrine and uptitrating dose of midodrine.  On 15 mg 3 times daily  Hyperlipidemia Lipitor on hold due to elevated LFTs.  CAD (coronary artery disease) Stable, no active chest pain. Lipitor on hold due to elevated LFTs Holding aspirin due to epistaxis and metoprolol due to low blood pressure  COPD (chronic obstructive pulmonary disease) ([name]) Stable without wheezing. Continue bronchodilators.  Normocytic anemia Hemoglobin stable Monitor CBC.  Epistaxis Holding aspirin and Eliquis for now.  Protein-calorie malnutrition, severe Complicates overall prognosis.  Enteritis, enteropathogenic E. coli See Diarrhea  Diarrhea Stool studies positive for enteropathogentic E. Coli.  Treated with Xifaxan.  Diarrhea resolved  Multifocal pneumonia CXR showed bilateral opacities concerning for PNA.  Concern he maybe aspirated during intractable N/V and dry heaving.   --Completed course of Rocephin --Pulmonary hygiene --Mucinex and other supportive care PRN -- He is on room air now -CT chest performed on 11/25 showed stable pulmonary nodule.  Also showed new patchy areas of groundglass opacity in the upper lung zone suggestive of possible atypical/viral pneumonia although considering his clinical has remained stable I will hold off starting any antibiotics at this time.  Outpatient pulmonary follow-up  Erosive esophagitis EGD on 11/6 notable for LA grade D esophagitis with no bleeding seen. --Biopsy path reviewed, benign --Continue PPI BID indefinitely -- Continue Carafate --Follow up in GI clinic as an outpatient  Intractable nausea and vomiting Chronic x several months. See Nausea & Vomiting, Erosive esophagitis EGD on 11/5 as above Follow up with GI as  outpatient. Reglan and Zofran did not help much.  Added Phenergan Less nauseous today  Dizziness See Near Syncope  Hyponatremia Likely hypovolemic in setting of N/V and dehydration.   Postural dizziness with near syncope Remains ongoing challenge.  Increased dose of midodrine on 11/23.  If still symptomatic with orthostatics we could consider increasing tomorrow  Generalized weakness Due to recurrent N/V x several months, unable to get adequate calories / nutrition. --PT/OT recommending SNF. --TOC following for placement, patient had insurance Auth but due to his ongoing symptomatic orthostatic hypotension I do not think he is stable for discharge medically --Fall precautions. Hopeful to have therapy evaluation today        Subjective: Having mild epistaxis.  Hopeful to work with therapy today  Physical Exam: Vitals:   05/12/22 0036 05/12/22 0608 05/12/22 0908 05/12/22 1202  BP: 130/75 128/60 112/60 121/76  Pulse: 63 61 63 60  Resp: '16 16 15 15  '$ Temp: 98.7 F (37.1 C) 98.3 F (36.8 C) 98.1 F (36.7 C)   TempSrc:      SpO2: 97% 98% 100% 100%  Weight:  58 kg    Height:       81 year old underweight frail-appearing male lying in the bed comfortably without any acute distress He has temporal wasting Lungs clear to auscultation bilaterally Cardiovascular regular rate and rhythm Abdomen soft, benign Neuro alert and awake, nonfocal Psych normal mood and affect Data Reviewed:  There are no new results to review at this time.  Family Communication: None  Disposition: Status is: Inpatient Remains inpatient appropriate because: Hoping to  get reevaluation by PT and see if he can tolerate therapy and not having much orthostatic symptoms  Planned Discharge Destination: Skilled nursing facility   DVT prophylaxis-SCDs Time spent: 35 minutes  Author: Max Sane, MD 05/12/2022 2:00 PM  For on call review [email or web address].

## 2022-05-12 NOTE — Assessment & Plan Note (Signed)
EGD on 11/6 notable for LA grade D esophagitis with no bleeding seen. --Biopsy path reviewed, benign --Continue PPI BID indefinitely -- Continue Carafate --Follow up in GI clinic as an outpatient

## 2022-05-12 NOTE — Assessment & Plan Note (Signed)
Stool studies positive for enteropathogentic E. Coli.  Treated with Xifaxan.  Diarrhea resolved

## 2022-05-12 NOTE — Assessment & Plan Note (Signed)
Acute respiratory failure with hypoxia likely due to pneumonia and CHF/fluid overload. Pneumonia has since resolved. --Supplement O2 if sats < 90% or PRN for comfort -- Has required intermittent gentle diuresis, very fluid sensitive -- He is weaned off to room air now.

## 2022-05-12 NOTE — Assessment & Plan Note (Signed)
Persistent orthostatic hypotension Initially attributed to dehydration in the setting of 1 week nausea vomiting.  Orthostatic vitals were positive and have remained so despite fluids, midodrine, TED hose, binder.  -- Treated with IV fluids initially due to profound dehydration.  Off fluids and has required intermittent diuresis --Daily orthostatic vitals - remain positive --Monitor on telemetry --Supportive care for GI illness as outlined  11/18--22: still +orthostatics, symptomatic, pt cannot tolerate upright position.  No improvement with TED hose and abdominal binder. --Re-consulted [facility] cardiology for possible medication adjustments.  They recommended getting EP's input --Discussed case with Dr. Caryl Comes on 11/21.  Concern for autonomic failure given heart rate does not compensate for orthostatic drop in blood pressure.  He recommended stopping amiodarone and mexiletine.  Indicated midodrine can be used above max dose and sometimes used as high as 20 mg QID.   Also recommended maintaining patient in reverse Trendelenburg (feet down) to encourage it autonomic nervous system to compensate for gravity on a more regular basis.  Abdominal binder only when upright, thigh sleeves likely more helpful than TED hose on the distal lower extremities.  Fludrocortisone is a poor option in this heart failure patient who is very fluid sensitive requiring IV diuresis at times.  Pyridostigmine only works when vertical and upright, can be continued.  Dr. Caryl Comes encouraged to reach out if any further questions. -- Dr. Luana Shu his EP at [facility].

## 2022-05-12 NOTE — Assessment & Plan Note (Signed)
On admission, hypovolemic and dehydrated. Diuretics on hold.  Was on IV fluids due to profound dehydration.  He developed worsening dyspnea and BNP was elevated, fluids were stopped and he was gently diuresed. 11/15--21: Gave IV Lasix a couple times this week due to increased O2 requirement up to 4 L after IV fluids were used in the setting of persistent diarrhea and ongoing nausea vomiting.  GI symptoms have improved and patient is tolerating p.o. intake fairly well.  Now stable on room air -- Intermittent diuresis as needed (appears he takes PRN torsemide at home) --Started on midodrine, continue --Metoprolol discontinued due to soft BP and severe orthostasis --Daily weights Net IO Since Admission: -12,844.71 mL [05/12/22 1357]  --Monitor closely -- [facility] cardiology is following.

## 2022-05-12 NOTE — Assessment & Plan Note (Signed)
Baseline creatinine 1.7-1.8.  Renal function on admission was at baseline. Creatinine trended up in setting of dehydration with intractable N/V, low BP's.   Cr stable, near baseline --Treated with IV fluids --Daily BMP to monitor --Avoid nephrotoxins, hypotension --Renally dose meds.

## 2022-05-12 NOTE — Assessment & Plan Note (Signed)
Hold metoprolol given soft BPs and severe orthostatic symptoms Diuretic on hold IV hydralazine as needed  Now on midodrine and uptitrating dose of midodrine.  On 15 mg 3 times daily

## 2022-05-12 NOTE — Assessment & Plan Note (Signed)
Persistent.  Continues to be extremely dizzy and lightheaded whenever in upright position. See orthostatic vitals in PT notes.  He continues to be severely orthostatic --Daily orthostatic vitals --Fall precautions --Started midodrine & dose was up-titrated to 15 mg TID on 11/23 (can consider increasing above max dose, as much as 20 mg possibly tomorrow if need).   --TED hose and Abdominal binder when upright --Try reverse trendelenburg bed position (at all times or at least for awhile prior to attempting upright) Hoping to work with therapy today

## 2022-05-12 NOTE — Progress Notes (Signed)
Physical Therapy Treatment Patient Details Name: Matthew Zamora MRN: [id number] DOB: Jan 03, 1941 Today's Date: 05/12/2022   History of Present Illness Matthew Zamora is an 54yoM presenting to ED with lightheadedness; PMH significant for  HTN, HLD, COPD, AF on Eliquis, CHF with EF of 35-40%, CAD, anemia, CKD-3B, frequent falls. Pt found to be orthostatic and presyncopal upon PT evaluation 04/18/22, orthostatic hypotension has been persistent and symptomatic.    PT Comments    Pt in bed on arrival, seen 45 minutes post administration of '15mg'$  midodrine therapy. Pt remains with extreme orthostatic presyncope, but with demonstration of recovery of pressure and most but not all symptoms with 2 minutes seated recovery intervals. Pt educated on potentially chronic nature of persistent orthostasis and advised to consider modifying his goal to be ambulatory prior to DC as [facility]ia does not believe pt will tolerate meaningful AMB distances prior to DC. Pt able to take park in 2 sets of STS transfers and short distance AMB at bedside, but requires appropriate seated recovery. Educated on avoiding flat bed when on midodrine, needs to be communicated to nursing as well as it has been shown to increase diuresis which through volume reduction further exacerbates orthostatic tendencies. 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    Orthostatic Vital signs             Supine   Sitting- 0 min  Sitting- 1 min Sitting- 2 min   S/p 3x STS (seated)  60sec s/p 3x STS (seated)  BP (mmHg)  HR (BPM)  BP (mmHg)  HR (BPM)  BP (mmHg)  HR (BPM)  BP (mmHg)  HR (BPM)  BP (mmHg)  HR (BPM)  BP (mmHg)  HR (BPM)   117/64 74 94/59 75 108/62 71 119/63 66 92/67 87 118/74 81  *symptomatic for all values except for supine value at start of session        Recommendations for follow up therapy are one component of a multi-disciplinary discharge planning  process, led by the attending physician.  Recommendations may be updated based on patient status, additional functional criteria and insurance authorization.  Follow Up Recommendations  Skilled nursing-short term rehab (<3 hours/day) Can patient physically be transported by private vehicle: No   Assistance Recommended at Discharge Intermittent Supervision/Assistance  Patient can return home with the following A little help with bathing/dressing/bathroom;Assist for transportation;Help with stairs or ramp for entrance;Assistance with cooking/housework;A lot of help with walking and/or transfers   Equipment Recommendations  Other (comment) (will eventually require WC for mobility needs.)    Recommendations for Other Services       Precautions / Restrictions Precautions Precautions: Fall Restrictions Weight Bearing Restrictions: No     Mobility  Bed Mobility Overal bed mobility: Modified Independent Bed Mobility: Supine to Sit, Sit to Supine           General bed mobility comments: dizzy upon sitting; BP recovery demonstrated at 60sec and 2' however dizziness persists within a tolerable range    Transfers Overall transfer level: Needs assistance Equipment used: Rolling walker (2 wheels) Transfers: Sit to/from Stand Sit to Stand: Min guard                Ambulation/Gait Ambulation/Gait assistance: Min guard Gait Distance (Feet): 8 Feet Assistive device: Rolling walker (2 wheels) Gait Pattern/deviations: WFL(Within Functional Limits)       General Gait Details: unable to go farther due to presyncope   Stairs  Wheelchair Mobility    Modified Rankin (Stroke Patients Only)       Balance                                            Cognition Arousal/Alertness: Awake/alert Behavior During Therapy: WFL for tasks assessed/performed Overall Cognitive Status: Within Functional Limits for tasks assessed                                           Exercises Other Exercises Other Exercises: STS from EOB 2x3 with seated recovery between (RW used, 2 posterior LOB) Other Exercises: AMB 70f with RW (416f, 5f6fackward)    General Comments        Pertinent Vitals/Pain Pain Assessment Pain Assessment: Faces Faces Pain Scale: Hurts little more Pain Location: bottom/HA Pain Intervention(s): Limited activity within patient's tolerance, Monitored during session, Premedicated before session, Repositioned    Home Living                          Prior Function            PT Goals (current goals can now be found in the care plan section) Acute Rehab PT Goals Patient Stated Goal: To return to walking PT Goal Formulation: With patient Time For Goal Achievement: 05/16/22 Potential to Achieve Goals: Fair Progress towards PT goals: Progressing toward goals    Frequency    Min 2X/week      PT Plan Current plan remains appropriate    Co-evaluation              AM-PAC PT "6 Clicks" Mobility   Outcome Measure  Help needed turning from your back to your side while in a flat bed without using bedrails?: A Little Help needed moving from lying on your back to sitting on the side of a flat bed without using bedrails?: A Little Help needed moving to and from a bed to a chair (including a wheelchair)?: A Little Help needed standing up from a chair using your arms (e.g., wheelchair or bedside chair)?: A Little Help needed to walk in hospital room?: A Little Help needed climbing 3-5 steps with a railing? : A Lot 6 Click Score: 17    End of Session   Activity Tolerance: Patient tolerated treatment well;Treatment limited secondary to medical complications (Comment) (pt does not tolerated upright activity for >5-10 second intervals) Patient left: in bed;with call bell/phone within reach (HOB elevtaed to >30 degrees, pt educated on avoiding flat HOB when on midodrine therapy) Nurse  Communication: Mobility status PT Visit Diagnosis: Unsteadiness on feet (R26.81);Muscle weakness (generalized) (M62.81);History of falling (Z91.81);Difficulty in walking, not elsewhere classified (R26.2)     Time: [id number] Time Calculation (min) (ACUTE ONLY): 24 min  Charges:  $Therapeutic Activity: 23-37 mins                    3:18 PM, 05/12/22 AllEtta GrandchildT, DPT Physical Therapist - [phone] Roshon Duell C 05/12/2022, 3:07 PM

## 2022-05-12 NOTE — Assessment & Plan Note (Signed)
Complicates overall prognosis. 

## 2022-05-12 NOTE — Assessment & Plan Note (Signed)
Chronic x several months. See Nausea & Vomiting, Erosive esophagitis EGD on 11/5 as above Follow up with GI as outpatient. Reglan and Zofran did not help much.  Added Phenergan Less nauseous today

## 2022-05-12 NOTE — Assessment & Plan Note (Signed)
Rate controlled. --Holding amiodarone, Mexitil after EP discussion as outlined. --Oral metoprolol stopped due to low BP's -- Holding Eliquis as having some epistaxis --Monitor on telemetry --Lopressor IV PRN for HR sustained above 110.  Cardiology following

## 2022-05-12 NOTE — Assessment & Plan Note (Signed)
Hemoglobin stable Monitor CBC.

## 2022-05-12 NOTE — Assessment & Plan Note (Addendum)
Stable, no active chest pain. Lipitor on hold due to elevated LFTs Holding aspirin due to epistaxis and metoprolol due to low blood pressure

## 2022-05-12 NOTE — Assessment & Plan Note (Signed)
Patient has AICD in place. --[facility] cardiology, they now recommend EP input with [address].  See Near Syncope. --Has been on amiodarone, mexiletine in past.   --There is drug interaction with Amio increasing effects of mexiletine, side effects of mexiletine are similar patient's presenting symptoms. 11/21 EP Dr. Caryl Comes recommended stopping these as the combination would explain patient's persistent symptoms, currently d/c'd orders for these.  Outpatient evaluation with EP at [facility], Dr. Luana Shu --Monitor on telemetry. --Maintain K above 4, mag above 2

## 2022-05-12 NOTE — Assessment & Plan Note (Signed)
K was 5.5 on admission, now normalized. Maintain K>4 given hx of dysrhythmias

## 2022-05-12 NOTE — Assessment & Plan Note (Signed)
Lipitor on hold due to elevated LFTs.

## 2022-05-12 NOTE — Assessment & Plan Note (Signed)
Remains ongoing challenge.  Increased dose of midodrine on 11/23.  If still symptomatic with orthostatics we could consider increasing tomorrow

## 2022-05-12 NOTE — Assessment & Plan Note (Signed)
Holding aspirin and Eliquis for now.

## 2022-05-12 NOTE — Assessment & Plan Note (Signed)
Likely hypovolemic in setting of N/V and dehydration.

## 2022-05-12 NOTE — Assessment & Plan Note (Addendum)
Due to recurrent N/V x several months, unable to get adequate calories / nutrition. --PT/OT recommending SNF. --TOC following for placement, patient had insurance Auth but due to his ongoing symptomatic orthostatic hypotension I do not think he is stable for discharge medically --Fall precautions. Hopeful to have therapy evaluation today

## 2022-05-12 NOTE — Assessment & Plan Note (Signed)
Stable without wheezing. Continue bronchodilators.

## 2022-05-12 NOTE — Assessment & Plan Note (Signed)
CXR showed bilateral opacities concerning for PNA.  Concern he maybe aspirated during intractable N/V and dry heaving.   --Completed course of Rocephin --Pulmonary hygiene --Mucinex and other supportive care PRN -- He is on room air now -CT chest performed on 11/25 showed stable pulmonary nodule.  Also showed new patchy areas of groundglass opacity in the upper lung zone suggestive of possible atypical/viral pneumonia although considering his clinical has remained stable I will hold off starting any antibiotics at this time.  Outpatient pulmonary follow-up

## 2022-05-13 DIAGNOSIS — Z7189 Other specified counseling: Secondary | ICD-10-CM | POA: Diagnosis not present

## 2022-05-13 DIAGNOSIS — E43 Unspecified severe protein-calorie malnutrition: Secondary | ICD-10-CM | POA: Diagnosis not present

## 2022-05-13 DIAGNOSIS — R0601 Orthopnea: Secondary | ICD-10-CM | POA: Diagnosis not present

## 2022-05-13 DIAGNOSIS — R42 Dizziness and giddiness: Secondary | ICD-10-CM | POA: Diagnosis not present

## 2022-05-13 DIAGNOSIS — R55 Syncope and collapse: Secondary | ICD-10-CM | POA: Diagnosis not present

## 2022-05-13 MED ORDER — METOCLOPRAMIDE HCL 5 MG PO TABS: 5.0000 mg | ORAL_TABLET | Freq: Three times a day (TID) | ORAL | Status: DC | PRN

## 2022-05-13 MED ORDER — PROSOURCE PLUS PO LIQD: 30.0000 mL | Freq: Three times a day (TID) | ORAL | Status: DC

## 2022-05-13 MED ORDER — PYRIDOSTIGMINE BROMIDE 60 MG PO TABS: 60.0000 mg | ORAL_TABLET | Freq: Three times a day (TID) | ORAL | Status: DC

## 2022-05-13 MED ORDER — SUCRALFATE 1 GM/10ML PO SUSP: 1.0000 g | Freq: Three times a day (TID) | ORAL | 0 refills | Status: DC

## 2022-05-13 MED ORDER — PANTOPRAZOLE SODIUM 40 MG PO TBEC: 40.0000 mg | DELAYED_RELEASE_TABLET | Freq: Two times a day (BID) | ORAL | Status: DC

## 2022-05-13 MED ORDER — MIDODRINE HCL 5 MG PO TABS: 15.0000 mg | ORAL_TABLET | Freq: Three times a day (TID) | ORAL | Status: DC

## 2022-05-13 NOTE — Discharge Summary (Signed)
Physician Discharge Summary   Patient: Matthew Zamora MRN: [id number] DOB: 04-02-1941  Admit date:     04/17/2022  Discharge date: 05/13/22  Discharge Physician: Max Sane   PCP: [place] Pink, MD   Recommendations at discharge:   Follow-up with outpatient providers as requested  Discharge Diagnoses: Principal Problem:   Near syncope Active Problems:   Dyspnea   NSVT (nonsustained ventricular tachycardia) ([name])   Hyperkalemia   Abnormal LFTs   Orthostatic hypotension   CKD (chronic kidney disease), stage IIIb   Paroxysmal atrial fibrillation ([name])   Chronic systolic CHF (congestive heart failure) ([name])   Essential hypertension   Hyperlipidemia   CAD (coronary artery disease)   COPD (chronic obstructive pulmonary disease) ([name])   Normocytic anemia   Generalized weakness   Postural dizziness with near syncope   Hyponatremia   Dizziness   Intractable nausea and vomiting   Erosive esophagitis   Multifocal pneumonia   Diarrhea   Enteritis, enteropathogenic E. coli   Protein-calorie malnutrition, severe   Epistaxis  Resolved Problems:   Nausea & vomiting  Hospital Course: Matthew Zamora is a 81 y.o. male with medical history significant of hypertension, hyperlipidemia, COPD, APF on Eliquis, CHF with EF of 35-40%, CAD, anemia, CKD-3B, frequent fall, who presented on 04/17/2022 for evaluation of dizziness and lightheadedness, generalized weakness, near syncopal episodes.  This occurred in the setting of about one week of GI illness with nausea and vomiting.  In the ED, afebrile and stable vitals.   Labs notable for K 5.5, no leukocytosis, normal lactic acid x 2, AST 129, ALT 136, Tbili 1.4.  BNP mildly elevated 354.5, troponin x 2 normal, negative for Covid-19 and flu A/B.  Admitted to medicine service for further evaluation and management.    11/2: pt with positive orthostatic vitals with severe symptoms of dizziness, unable to tolerate being upright.  Remains  with poor PO intake, started on IV fluids.  11/3--5: ongoing N/V, dizziness. GI and cardiology consulted.  IV fluids stopped 11/4 due to dyspnea and cxr with mild vascular congestion.  Eliquis held for EGD. 11/6: EGD showed erosive esophagitis, submucosal duodenal nodule - biopsies benign.  Eliquis resumed.  Hospital course has been prolonged and complicated by worsened acute respiratory failure with hypoxia, increased O2 requirements.  Given intermittent diuresis as BP allows.  He had multifocal pneumonia, completed course of antibiotics.  He later had E. Coli infection with diarrhea which has resolved.    Pt has persistently had positive orthostatic BP's, ongoing dizziness when upright.  He has been unable to participate in PT.    He continues to have intermittent nausea/vomiting some days but appears in tolerating adequate PO intake.  Prognosis overall is guarded.  Patient remains high risk for clinical deterioration and readmission.  11/22: Continues to be very orthostatic on PT evaluation  11/23: Increased dose of midodrine to 15 mg 3 times daily and added Reglan 11/24: Nauseous and dizzy 11/25: Hoping to work with therapy.  Less nauseous today.  Slept well.  11/26: CT chest yesterday showed stable pulmonary nodule, mild epistaxis today so holding aspirin and Eliquis.  Hoping to work with therapy today   Assessment and Plan: * Near syncope Persistent orthostatic hypotension Initially attributed to dehydration in the setting of 1 week nausea vomiting.  Orthostatic vitals were positive and have remained so despite fluids, midodrine, TED hose, binder.  -- Treated with IV fluids initially due to profound dehydration.  Off fluids and has required intermittent diuresis 11/18--22:  still +orthostatics, symptomatic, pt cannot tolerate upright position.  No improvement with TED hose and abdominal binder. ---Discussed case with Dr. Caryl Comes electrophysiology/cardiology.  Concern for autonomic failure  given heart rate does not compensate for orthostatic drop in blood pressure.  He recommended stopping amiodarone and mexiletine.  Indicated midodrine can be used above max dose and sometimes used as high as 20 mg QID.   Also recommended maintaining patient in reverse Trendelenburg (feet down) to encourage it autonomic nervous system to compensate for gravity on a more regular basis.  Abdominal binder only when upright, thigh sleeves likely more helpful than TED hose on the distal lower extremities.  Fludrocortisone is a poor option in this heart failure patient who is very fluid sensitive requiring IV diuresis at times.  Pyridostigmine only works when vertical and upright, can be continued.  Dr. Caryl Comes encouraged to reach out if any further questions. --Follow-up outpatient with Dr. Luana Shu his EP at [facility]    Dyspnea Acute respiratory failure with hypoxia likely due to pneumonia and CHF/fluid overload. Pneumonia has since resolved. -- Now resolved.  He is on room air  NSVT (nonsustained ventricular tachycardia) ([place]) Patient has AICD in place. -- Evaluated by EP Dr. Caryl Comes, who recommended stopping amiodarone and mexiletine.  Outpatient evaluation with EP at [facility], Dr. Luana Shu  Nausea & vomiting --GI seen EGD done 11/6 (erosive esophagitis). -As needed Phenergan and or other antiemetics  Orthostatic hypotension Persistent.  Continues to be extremely dizzy and lightheaded whenever in upright position. Continue midodrine 15 mg TID (can consider increasing above max dose, as much as 20 mg possibly tomorrow if need).   --TED hose and Abdominal binder when upright --Try reverse trendelenburg bed position (at all times or at least for awhile prior to attempting upright) -Worked very well with therapy on the day of discharge  Abnormal LFTs RUQ ultrasound showed normal gallbladder and CBD, stable complex cyst in the right lobe of liver.  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   Acute  hepatitis panel negative. -- Improved  Hyperkalemia K was 5.5 on admission, now normalized.  CKD (chronic kidney disease), stage IIIb At baseline  Chronic systolic CHF (congestive heart failure) ([place]) Well compensated at this time.  Hold diuretic due to his severe orthostatic symptoms  Paroxysmal atrial fibrillation ([name]) Rate controlled. -- Holding rate controlling agent due to hypotension.  On Eliquis for anticoagulation  Essential hypertension Hold metoprolol given soft BPs and severe orthostatic symptoms Diuretic on hold  Hyperlipidemia CAD (coronary artery disease) COPD (chronic obstructive pulmonary disease) ([name]) Normocytic anemia Epistaxis -held aspirin and Eliquis for a day and epistaxis resolved Protein-calorie malnutrition, severe Complicates overall prognosis.  Enteritis, enteropathogenic E. coli Diarrhea Stool studies positive for enteropathogentic E. Coli.  Treated with Xifaxan.  Diarrhea resolved  Multifocal pneumonia CXR showed bilateral opacities concerning for PNA.  Concern he maybe aspirated during intractable N/V and dry heaving.   --Completed course of antibiotics -CT chest performed on 11/25 showed stable pulmonary nodule.  Also showed new patchy areas of groundglass opacity in the upper lung zone suggestive of possible atypical/viral pneumonia although considering his clinical has remained stable I will hold off starting any antibiotics at this time.  Outpatient pulmonary follow-up  Erosive esophagitis EGD on 11/6 notable for LA grade D esophagitis with no bleeding seen. --Biopsy path reviewed, benign --Continue PPI BID indefinitely -- Continue Carafate --Follow up in GI clinic as an outpatient  Intractable nausea and vomiting Resolved  Hyponatremia Improved with hydration  and stopping diuretic  Postural dizziness with near syncope Continue midodrine  Generalized weakness Multifactorial  Goals of care Patient is at very high risk for  ongoing clinical and functional decline.  I recommend hospice although patient and family are not quite on board.  They are not realistic yet.  I recommend outpatient evaluation by palliative care/hospice team and continue ongoing goals of care conversation He remains at very high risk for readmission        Consultants: Cardiology, palliative care Disposition: Skilled nursing facility Diet recommendation:  Discharge Diet Orders (From admission, onward)     Start     Ordered   05/13/22 0000  Diet - low sodium heart healthy        05/13/22 1154           Carb modified diet DISCHARGE MEDICATION: Allergies as of 05/13/2022       Reactions   Nitrofuran Derivatives Other (See Comments)   Transaminitis ** confounded w/Amiodarone, Mexiletine   Spironolactone    Significant transaminitis         Medication List     STOP taking these medications    amiodarone 400 MG tablet Commonly known as: PACERONE   benzonatate 100 MG capsule Commonly known as: Tessalon Perles   metoprolol succinate 25 MG 24 hr tablet Commonly known as: TOPROL-XL   mexiletine 150 MG capsule Commonly known as: MEXITIL   omeprazole 20 MG capsule Commonly known as: PRILOSEC   oxymetazoline 0.05 % nasal spray Commonly known as: AFRIN   torsemide 20 MG tablet Commonly known as: DEMADEX       TAKE these medications    (feeding supplement) PROSource Plus liquid Take 30 mLs by mouth 3 (three) times daily between meals.   acetaminophen 325 MG tablet Commonly known as: TYLENOL Take 2 tablets (650 mg total) by mouth every 4 (four) hours as needed for headache or mild pain.   albuterol (2.5 MG/3ML) 0.083% nebulizer solution Commonly known as: PROVENTIL Inhale 3 mLs (2.5 mg total) into the lungs as needed for shortness of breath.   albuterol 108 (90 Base) MCG/ACT inhaler Commonly known as: VENTOLIN HFA Inhale 2 puffs into the lungs every 4 (four) hours as needed.   ascorbic acid 500 MG  tablet Commonly known as: VITAMIN C Take 1 tablet by mouth daily.   aspirin 81 MG chewable tablet Chew 81 mg by mouth daily at 12 noon.   atorvastatin 80 MG tablet Commonly known as: LIPITOR Take 80 mg by mouth daily.   calcium carbonate 750 MG chewable tablet Commonly known as: TUMS EX Chew 1 tablet by mouth daily.   Eliquis 2.5 MG Tabs tablet Generic drug: apixaban Take 2.5 mg by mouth 2 (two) times daily.   fluticasone-salmeterol 500-50 MCG/ACT Aepb Commonly known as: ADVAIR Inhale 1 puff into the lungs in the morning and at bedtime.   gabapentin 100 MG capsule Commonly known as: NEURONTIN Take 100 mg by mouth at bedtime.   magnesium oxide 400 (240 Mg) MG tablet Commonly known as: MAG-OX Take 1 tablet by mouth daily.   melatonin 3 MG Tabs tablet Take 9 mg by mouth at bedtime.   metoCLOPramide 5 MG tablet Commonly known as: REGLAN Take 1 tablet (5 mg total) by mouth every 8 (eight) hours as needed for refractory nausea / vomiting.   midodrine 5 MG tablet Commonly known as: PROAMATINE Take 3 tablets (15 mg total) by mouth 3 (three) times daily with meals.   multivitamin with minerals Tabs  tablet Take 1 tablet by mouth daily.   Nebulizer Misc 1 each by Does not apply route as needed.   ondansetron 4 MG disintegrating tablet Commonly known as: ZOFRAN-ODT Take 4 mg by mouth every 8 (eight) hours as needed for refractory nausea / vomiting, vomiting or nausea.   pantoprazole 40 MG tablet Commonly known as: PROTONIX Take 1 tablet (40 mg total) by mouth 2 (two) times daily.   pyridostigmine 60 MG tablet Commonly known as: MESTINON Take 1 tablet (60 mg total) by mouth every 8 (eight) hours.   sertraline 25 MG tablet Commonly known as: ZOLOFT Take 25 mg by mouth daily.   sucralfate 1 GM/10ML suspension Commonly known as: CARAFATE Take 10 mLs (1 g total) by mouth 4 (four) times daily -  with meals and at bedtime.   tadalafil 20 MG tablet Commonly known as:  CIALIS Take 20 mg by mouth daily.   tamsulosin 0.4 MG Caps capsule Commonly known as: FLOMAX Take 0.4 mg by mouth daily.        Contact information for after-discharge care     Destination     [facility] .   Service: Skilled Nursing Contact information: [address] [place] [phone]                    Discharge Exam: Filed Weights   05/11/22 0144 05/12/22 0608 05/13/22 0344  Weight: 54.9 kg 58 kg 26.19 kg   81 year old underweight frail-appearing male lying in the bed comfortably without any acute distress He has temporal wasting Lungs clear to auscultation bilaterally Cardiovascular regular rate and rhythm Abdomen soft, benign Neuro alert and awake, nonfocal Psych normal mood and affect  Condition at discharge: fair  The results of significant diagnostics from this hospitalization (including imaging, microbiology, ancillary and laboratory) are listed below for reference.   Imaging Studies: CT CHEST WO CONTRAST  Result Date: 05/11/2022 CLINICAL DATA:  Followup pulmonary nodule.  * Tracking Code: BO * EXAM: CT CHEST WITHOUT CONTRAST TECHNIQUE: Multidetector CT imaging of the chest was performed following the standard protocol without IV contrast. RADIATION DOSE REDUCTION: This exam was performed according to the departmental dose-optimization program which includes automated exposure control, adjustment of the mA and/or kV according to patient size and/or use of iterative reconstruction technique. COMPARISON:  CT scan 06/13/2021 FINDINGS: Cardiovascular: The heart is normal in size. No pericardial effusion. Stable tortuosity, ectasia and calcification of the thoracic aorta. No focal aneurysm. Stable extensive three-vessel coronary artery calcifications. The pacer wires are stable. Mediastinum/Nodes: Small scattered mediastinal and hilar lymph nodes are stable. No mass or overt adenopathy. The esophagus is  grossly. Lungs/Pleura: Underlying emphysematous changes and pulmonary scarring. There are several patchy areas of ground-glass opacity most notably in the upper lung zones with some associated vague nodularity. This has the appearance of an inflammatory or infectious process and could be atypical/viral pneumonia. Cryptogenic organizing pneumonia and hypersensitivity pneumonitis are other possibilities. The right lower lobe nodule seen medially on the prior study have resolved. Stable linear and nodular scarring changes at the right lung base. Left lower lobe pulmonary nodule on image 89/3 measures 7 mm and is unchanged. New area of irregular subpleural density in the right upper lobe laterally adjacent to the major fissure. This could be atelectasis or an inflammatory process. A pulmonary lesion is unlikely. No pleural effusions. Stable scattered calcified granulomas and a few small scattered calcified pleural plaques Upper Abdomen: No significant upper abdominal  findings. Stable hepatic cysts. Stable advanced vascular calcifications. Musculoskeletal: No chest wall mass, supraclavicular or axillary adenopathy. The bony thorax is intact. IMPRESSION: 1. New patchy areas of ground-glass opacity most notably in the upper lung zones with some associated vague nodularity. This has the appearance of an inflammatory or infectious process and could be atypical/viral pneumonia. Cryptogenic organizing pneumonia and hypersensitivity pneumonitis are other possibilities. 2. Peripheral triangular shaped fluid density in the right upper lobe is likely an area of atelectasis or inflammation. Recommend follow-up noncontrast chest CT in 3 months to reassess. 3. 7 mm left lower lobe pulmonary nodule is unchanged. 4. Interval clearing of the right lower lobe pulmonary nodules. Aortic Atherosclerosis (ICD10-I70.0) and Emphysema (ICD10-J43.9). Electronically Signed   By: Marijo Sanes M.D.   On: 05/11/2022 17:25   DG Chest 1  View  Result Date: 05/02/2022 CLINICAL DATA:  Dyspnea, hypoxia EXAM: CHEST  1 VIEW COMPARISON:  04/28/2022 FINDINGS: Multifocal airspace infiltrates, asymmetrically more severe throughout the left lung, appears stable since prior examination, likely infectious or inflammatory. No pneumothorax or pleural effusion. Cardiac size within normal limits. Left subclavian pacemaker defibrillator is unchanged. No acute bone abnormality. IMPRESSION: 1. Stable multifocal pulmonary infiltrate, likely infectious or inflammatory. Electronically Signed   By: Fidela Salisbury M.D.   On: 05/02/2022 04:03   DG Chest 1 View  Result Date: 04/28/2022 CLINICAL DATA:  Cough. EXAM: CHEST  1 VIEW COMPARISON:  Chest radiograph dated 04/23/2022. FINDINGS: There is diffuse interstitial and airspace densities. Slight progression of densities in the left mid to upper lung field. No pleural effusion or pneumothorax. Stable cardiac silhouette. Left pectoral AICD device. Similar appearance of a kinked right ventricular lead. No acute osseous pathology. IMPRESSION: Diffuse interstitial and airspace densities with slight progression of densities in the left mid to upper lung field. Electronically Signed   By: Anner Crete M.D.   On: 04/28/2022 20:27   DG Chest Port 1 View  Result Date: 04/23/2022 CLINICAL DATA:  Productive cough EXAM: PORTABLE CHEST 1 VIEW COMPARISON:  04/20/2022, multiple old films dating back to 02/14/2020 FINDINGS: AICD remains in place. The right ventricular lead is kinked within the right ventricle. Heart is normal size. Mediastinal contours within normal limits. Patchy bilateral airspace disease most notable in the right mid lung and left lower lobe concerning for pneumonia. No effusions or acute bony abnormality. IMPRESSION: Patchy bilateral airspace disease concerning for pneumonia. AICD right ventricular lead kinked within the right ventricle. This is unchanged dating back to 01/03/2022. Electronically Signed    By: Rolm Baptise M.D.   On: 04/23/2022 11:33   ECHOCARDIOGRAM COMPLETE  Result Date: 04/21/2022    ECHOCARDIOGRAM REPORT   Patient Name:   Matthew Zamora Date of Exam: 04/21/2022 Medical Rec #:  [id number]           Height:       68.0 in Accession #:    [id number]          Weight:       146.4 lb Date of Birth:  Jul 17, 1940           BSA:          1.790 m Patient Age:    59 years            BP:           138/63 mmHg Patient Gender: M                   HR:  82 bpm. Exam Location:  [facility] Procedure: 2D Echo Indications:     CHF I50.21  History:         Patient has no prior history of Echocardiogram examinations.  Sonographer:     Kathlen Brunswick RDCS Referring Phys:  [id number] Claiborne Billings A GRIFFITH Diagnosing Phys: Yolonda Kida MD  Sonographer Comments: Technically difficult study due to poor echo windows and suboptimal apical window. The patient coughed throughout this exam, making image acquisition challenging. IMPRESSIONS  1. Left ventricular ejection fraction, by estimation, is 30 to 35%. The left ventricle has moderately decreased function. The left ventricle demonstrates global hypokinesis. The left ventricular internal cavity size was mildly to moderately dilated. Left ventricular diastolic parameters are consistent with Grade III diastolic dysfunction (restrictive).  2. Right ventricular systolic function is low normal. The right ventricular size is mildly enlarged.  3. Left atrial size was moderately dilated.  4. Right atrial size was mild to moderately dilated.  5. The mitral valve is normal in structure. Trivial mitral valve regurgitation.  6. The aortic valve is normal in structure. Aortic valve regurgitation is not visualized. Aortic valve sclerosis/calcification is present, without any evidence of aortic stenosis. FINDINGS  Left Ventricle: Left ventricular ejection fraction, by estimation, is 30 to 35%. The left ventricle has moderately decreased function. The left ventricle demonstrates  global hypokinesis. The left ventricular internal cavity size was mildly to moderately  dilated. There is no left ventricular hypertrophy. Left ventricular diastolic parameters are consistent with Grade III diastolic dysfunction (restrictive). Right Ventricle: The right ventricular size is mildly enlarged. No increase in right ventricular wall thickness. Right ventricular systolic function is low normal. Left Atrium: Left atrial size was moderately dilated. Right Atrium: Right atrial size was mild to moderately dilated. Pericardium: There is no evidence of pericardial effusion. Mitral Valve: The mitral valve is normal in structure. Trivial mitral valve regurgitation. Tricuspid Valve: The tricuspid valve is normal in structure. Tricuspid valve regurgitation is mild. Aortic Valve: The aortic valve is normal in structure. Aortic valve regurgitation is not visualized. Aortic valve sclerosis/calcification is present, without any evidence of aortic stenosis. Aortic valve peak gradient measures 9.2 mmHg. Pulmonic Valve: The pulmonic valve was normal in structure. Pulmonic valve regurgitation is not visualized. Aorta: The ascending aorta was not well visualized. IAS/Shunts: No atrial level shunt detected by color flow Doppler. Additional Comments: A device lead is visualized.  LEFT VENTRICLE PLAX 2D LVIDd:         5.10 cm     Diastology LVIDs:         4.50 cm     LV e' medial:    5.33 cm/s LV PW:         1.00 cm     LV E/e' medial:  23.2 LV IVS:        1.00 cm     LV e' lateral:   11.16 cm/s LVOT diam:     1.80 cm     LV E/e' lateral: 11.1 LV SV:         38 LV SV Index:   21 LVOT Area:     2.54 cm  LV Volumes (MOD) LV vol d, MOD A4C: 94.7 ml LV vol s, MOD A4C: 64.0 ml LV SV MOD A4C:     94.7 ml RIGHT VENTRICLE RV Basal diam:  3.20 cm RV S prime:     11.30 cm/s TAPSE (M-mode): 2.5 cm LEFT ATRIUM           Index  RIGHT ATRIUM          Index LA diam:      5.20 cm 2.91 cm/m   RA Area:     8.76 cm LA Vol (A2C): 33.3 ml  18.60 ml/m  RA Volume:   18.30 ml 10.22 ml/m LA Vol (A4C): 43.0 ml 24.02 ml/m  AORTIC VALVE                 PULMONIC VALVE AV Area (Vmax): 1.47 cm     PV Vmax:       0.87 m/s AV Vmax:        152.00 cm/s  PV Peak grad:  3.0 mmHg AV Peak Grad:   9.2 mmHg LVOT Vmax:      87.75 cm/s LVOT Vmean:     55.800 cm/s LVOT VTI:       0.149 m  AORTA Ao Root diam: 3.10 cm MITRAL VALVE                TRICUSPID VALVE MV Area (PHT): 4.71 cm     TR Peak grad:   46.2 mmHg MV Decel Time: 161 msec     TR Vmax:        340.00 cm/s MV E velocity: 123.50 cm/s MV A velocity: 58.40 cm/s   SHUNTS MV E/A ratio:  2.11         Systemic VTI:  0.15 m                             Systemic Diam: 1.80 cm Yolonda Kida MD Electronically signed by Yolonda Kida MD Signature Date/Time: 04/21/2022/12:41:53 PM    Final    DG Chest Port 1 View  Result Date: 04/20/2022 CLINICAL DATA:  Shortness of breath EXAM: PORTABLE CHEST 1 VIEW COMPARISON:  04/10/2022 FINDINGS: Dual-chamber ICD leads from the left in stable position. Unchanged heart size and mediastinal contours. Mild interstitial prominence but no Kerley lines, effusion, or air bronchogram. No pneumothorax. IMPRESSION: Borderline vascular congestion. Electronically Signed   By: Jorje Guild M.D.   On: 04/20/2022 06:35   US ABDOMEN LIMITED RUQ (LIVER/GB)  Result Date: 04/17/2022 CLINICAL DATA:  Abnormal LFT EXAM: ULTRASOUND ABDOMEN LIMITED RIGHT UPPER QUADRANT COMPARISON:  CT abdomen 03/27/2022, 05/10/2020 FINDINGS: Gallbladder: No gallstones or wall thickening visualized. No sonographic Murphy sign noted by sonographer. 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 Multiple septations. This cyst measures 6.1 x 5.5 x 5.4 cm and is stable from the prior CT from this year and 2021. Additional smaller cysts in the right lobe of liver better seen on CT. Portal vein is patent on color Doppler imaging with normal direction of blood  flow towards the liver. Other: None. IMPRESSION: 1. The gallbladder and common bile duct are normal. 2. 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 This cyst is stable from the prior CT from this year and 2021. Electronically Signed   By: Franchot Gallo M.D.   On: 04/17/2022 15:29   CT Head Wo Contrast  Result Date: 04/17/2022 CLINICAL DATA:  Syncope EXAM: CT HEAD WITHOUT CONTRAST TECHNIQUE: Contiguous axial images were obtained from the base of the skull through the vertex without intravenous contrast. RADIATION DOSE REDUCTION: This exam was performed according to the departmental dose-optimization program which includes automated exposure control, adjustment of the  mA and/or kV according to patient size and/or use of iterative reconstruction technique. COMPARISON:  CT head 03/27/2022 FINDINGS: Brain: No evidence of acute infarction, hemorrhage, hydrocephalus, extra-axial collection or mass lesion/mass effect. Mild white matter hypodensity consistent with chronic microvascular ischemia Vascular: Negative for hyperdense vessel Skull: Negative Sinuses/Orbits: Paranasal sinuses clear. Bilateral cataract extraction Other: None IMPRESSION: No acute abnormality no interval change. Electronically Signed   By: Franchot Gallo M.D.   On: 04/17/2022 11:22    Microbiology: Results for orders placed or performed during the hospital encounter of 04/17/22  Resp Panel by RT-PCR (Flu A&B, Covid) Anterior Nasal Swab     Status: None   Collection Time: 04/17/22 11:05 AM   Specimen: Anterior Nasal Swab  Result Value Ref Range Status   SARS Coronavirus 2 by RT PCR NEGATIVE NEGATIVE Final    Comment: (NOTE) SARS-CoV-2 target nucleic acids are NOT DETECTED.  The SARS-CoV-2 RNA is generally detectable in upper respiratory specimens during the acute phase of infection. The lowest concentration of SARS-CoV-2 viral copies this assay can detect is 138 copies/mL. A  negative result does not preclude SARS-Cov-2 infection and should not be used as the sole basis for treatment or other patient management decisions. A negative result may occur with  improper specimen collection/handling, submission of specimen other than nasopharyngeal swab, presence of viral mutation(s) within the areas targeted by this assay, and inadequate number of viral copies(<138 copies/mL). A negative result must be combined with clinical observations, patient history, and epidemiological information. The expected result is Negative.  Fact Sheet for Patients:  EntrepreneurPulse.com.au  Fact Sheet for Healthcare Providers:  IncredibleEmployment.be  This test is no t yet approved or cleared by the [place] FDA and  has been authorized for detection and/or diagnosis of SARS-CoV-2 by FDA under an Emergency Use Authorization (EUA). This EUA will remain  in effect (meaning this test can be used) for the duration of the COVID-19 declaration under Section 564(b)(1) of the Act, 21 U.S.C.section 360bbb-3(b)(1), unless the authorization is terminated  or revoked sooner.       Influenza A by PCR NEGATIVE NEGATIVE Final   Influenza B by PCR NEGATIVE NEGATIVE Final    Comment: (NOTE) The Xpert Xpress SARS-CoV-2/FLU/RSV plus assay is intended as an aid in the diagnosis of influenza from Nasopharyngeal swab specimens and should not be used as a sole basis for treatment. Nasal washings and aspirates are unacceptable for Xpert Xpress SARS-CoV-2/FLU/RSV testing.  Fact Sheet for Patients: EntrepreneurPulse.com.au  Fact Sheet for Healthcare Providers: IncredibleEmployment.be  This test is not yet approved or cleared by the [place] FDA and has been authorized for detection and/or diagnosis of SARS-CoV-2 by FDA under an Emergency Use Authorization (EUA). This EUA will remain in effect (meaning this test can  be used) for the duration of the COVID-19 declaration under Section 564(b)(1) of the Act, 21 U.S.C. section 360bbb-3(b)(1), unless the authorization is terminated or revoked.  Performed at [facility], [address] [place], [place] [address]   Gastrointestinal Panel by PCR , Stool     Status: Abnormal   Collection Time: 04/29/22 10:08 AM   Specimen: Stool  Result Value Ref Range Status   Campylobacter species NOT DETECTED NOT DETECTED Final   Plesimonas shigelloides NOT DETECTED NOT DETECTED Final   Salmonella species NOT DETECTED NOT DETECTED Final   Yersinia enterocolitica NOT DETECTED NOT DETECTED Final   Vibrio species NOT DETECTED NOT DETECTED Final   Vibrio cholerae NOT DETECTED NOT DETECTED Final  Enteroaggregative E coli (EAEC) NOT DETECTED NOT DETECTED Final   Enteropathogenic E coli (EPEC) DETECTED (A) NOT DETECTED Final    Comment: RESULT CALLED TO, READ BACK BY AND VERIFIED WITH: CHRIS BENNETT RN 1135 04/29/22 HNM    Enterotoxigenic E coli (ETEC) NOT DETECTED NOT DETECTED Final   Shiga like toxin producing E coli (STEC) NOT DETECTED NOT DETECTED Final   Shigella/Enteroinvasive E coli (EIEC) NOT DETECTED NOT DETECTED Final   Cryptosporidium NOT DETECTED NOT DETECTED Final   Cyclospora cayetanensis NOT DETECTED NOT DETECTED Final   Entamoeba histolytica NOT DETECTED NOT DETECTED Final   Giardia lamblia NOT DETECTED NOT DETECTED Final   Adenovirus F40/41 NOT DETECTED NOT DETECTED Final   Astrovirus NOT DETECTED NOT DETECTED Final   Norovirus GI/GII NOT DETECTED NOT DETECTED Final   Rotavirus A NOT DETECTED NOT DETECTED Final   Sapovirus (I, II, IV, and V) NOT DETECTED NOT DETECTED Final    Comment: Performed at [facility], [address] [place], [place] [address]    Labs: CBC: Recent Labs  Lab 05/09/22 0440  WBC 6.5  HGB 8.9*  HCT 27.8*  MCV 90.3  PLT 287*   Basic Metabolic Panel: Recent Labs  Lab 05/07/22 0506 05/08/22 0358  05/09/22 0440  NA 135 132* 134*  K 4.5 4.7 4.6  CL 100 100 102  CO2 '30 28 28  '$ GLUCOSE 81 84 82  BUN '15 17 17  '$ CREATININE 1.45* 1.43* 1.52*  CALCIUM 8.8* 9.0 9.0   Liver Function Tests: Recent Labs  Lab 05/08/22 0358  AST 55*  ALT 66*  ALKPHOS 108  BILITOT 0.7  PROT 5.6*  ALBUMIN 2.5*   CBG: No results for input(s): "GLUCAP" in the last 168 hours.  Discharge time spent: greater than 30 minutes.  Signed: Max Sane, MD [facility] 05/13/2022

## 2022-05-13 NOTE — Progress Notes (Signed)
   05/13/22 1221  Medical Necessity for Transport Certificate --- IF THIS TRANSPORT IS ROUND TRIP OR SCHEDULED AND REPEATED, A PHYSICIAN MUST COMPLETE THIS FORM  Transport from: Teacher, English as a foreign language) Doctor, hospital to Teacher, English as a foreign language) Other (Comment) ([place] room E12)  Did the patient arrive from a [place], [place] or Group Home? No  Is this the closest appropriate facility? Yes  Date of Transport Service 05/13/22  Name of [facility]  Round Trip Transport? No  Reason for Transport Discharge  Is this a hospice patient? No  Describe the Medical Condition near syncope, nonsustained ventricular tachycardia, hyperkalemia, chronic kidney disease, paroxysmal atrial fibrillation, congestive heart failure, hypertension, COPD, weakness, pneumonia  Q1 Are ALL the following "true"? 1. Patient unable to get up from bed without assistance  AND  2. Unable to ambulate  AND  3. Unable to sit in a chair, including wheelchair. Yes  Q2 Could the patient be transported safely by other means of transportation (I.E., wheelchair van)? No  Q3 Please check any of the following conditions that apply at the time of transport: Risk of injury to self and/or others;Other (Comment) (weakness)  Electronic Signature Colen Darling  Credentials DP  Date Signed 05/13/22

## 2022-05-13 NOTE — Care Management Important Message (Signed)
Important Message  Patient Details  Name: Matthew Zamora MRN: [id number] Date of Birth: September 27, 1940   Medicare Important Message Given:  Yes     Juliann Pulse A Matthew Zamora 05/13/2022, 1:16 PM

## 2022-05-13 NOTE — Progress Notes (Signed)
Called and gave report to Barnie Del RN  at [facility] [phone]

## 2022-05-13 NOTE — TOC Transition Note (Signed)
Transition of Care [facility]) - CM/SW Discharge Note   Patient Details  Name: Matthew Zamora MRN: [id number] Date of Birth: 27-Aug-1940  Transition of Care [facility]) CM/SW Contact:  Colen Darling, [place] Phone Number: 05/13/2022, 4:50 PM   Clinical Narrative:     Patient transfer to [facility] today. Authorization has been approved until 11/29. TOC spoke with DON at [facility] and the patient can discharge to SNF. [facility] called. RN called report to SNF.  Family has been notified.   Final next level of care: Skilled Nursing Facility Barriers to Discharge: Barriers Resolved   Patient Goals and CMS Choice Patient states their goals for this hospitalization and ongoing recovery are:: transfer to SNF CMS Medicare.gov Compare Post Acute Care list provided to:: Patient Represenative (must comment) [place] [facility]) Choice offered to / list presented to : Adult Children  Discharge Placement PASRR number recieved: 05/13/22 Existing PASRR number confirmed : 05/13/22          Patient chooses bed at:  [facility]) Patient to be transferred to facility by: [facility] Name of family member notified: [name] Commins-[phone] Patient and family notified of of transfer: 05/13/22  Discharge Plan and Services   Discharge Planning Services: CM Consult                                 Social Determinants of Health ([place]) Interventions     Readmission Risk Interventions    04/22/2022    3:54 PM  Readmission Risk Prevention Plan  Transportation Screening Complete  Medication Review ([place]) Complete  PCP or Specialist appointment within 3-5 days of discharge Complete  HRI or [place] Complete  SW Recovery Care/Counseling Consult Complete  [place] Not Applicable

## 2022-05-13 NOTE — Progress Notes (Signed)
Daily Progress Note   Patient Name: Matthew Zamora       Date: 05/13/2022 DOB: 03-03-1941  Age: 81 y.o. MRN#: [id number] Attending Physician: Max Sane, MD Primary Care Physician: Matthew Pink, MD Admit Date: 04/17/2022  Reason for Consultation/Follow-up: Establishing goals of care  Subjective: Notes and labs reviewed. Orthostatic hypotension is improving. Patient states his N/V improved over the weekend and he has been feeling better. He states he became sick this morning. Mediations are available as needed for N/V. Case discussed with primary RN.    Length of Stay: 25  Current Medications: Scheduled Meds:   (feeding supplement) PROSource Plus  30 mL Oral TID BM   ascorbic acid  500 mg Oral Daily   dextromethorphan-guaiFENesin  1 tablet Oral BID   feeding supplement  1 Container Oral TID BM   magnesium oxide  400 mg Oral Daily   midodrine  15 mg Oral TID WC   mometasone-formoterol  2 puff Inhalation BID   multivitamin with minerals  1 tablet Oral Daily   pantoprazole  40 mg Oral BID   pyridostigmine  60 mg Oral Q8H   senna-docusate  2 tablet Oral BID   sertraline  25 mg Oral Daily   sucralfate  1 g Oral TID WC & HS    Continuous Infusions:  promethazine (PHENERGAN) injection (IM or IVPB) Stopped (05/10/22 1328)    PRN Meds: acetaminophen, albuterol, benzonatate, calcium carbonate, HYDROcodone bit-homatropine, liver oil-zinc oxide, metoCLOPramide, metoprolol tartrate, ondansetron, polyvinyl alcohol, promethazine (PHENERGAN) injection (IM or IVPB)  Physical Exam Pulmonary:     Effort: Pulmonary effort is normal.  Neurological:     Mental Status: He is alert.             Vital Signs: BP (!) 133/95 (BP Location: Right Arm)   Pulse 67   Temp 98.2 F (36.8 C)  (Oral)   Resp 16   Ht '5\' 8"'$  (1.727 m)   Wt 55.9 kg   SpO2 97%   BMI 18.74 kg/m  SpO2: SpO2: 97 % O2 Device: O2 Device: Room Air O2 Flow Rate: O2 Flow Rate (L/min): 2 L/min  Intake/output summary:  Intake/Output Summary (Last 24 hours) at 05/13/2022 1007 Last data filed at 05/13/2022 0321 Gross per 24 hour  Intake --  Output 500 ml  Net -500 ml  LBM: Last BM Date : 05/12/22 Baseline Weight: Weight: 63.6 kg Most recent weight: Weight: 55.9 kg        Patient Active Problem List   Diagnosis Date Noted   Epistaxis 05/12/2022   Protein-calorie malnutrition, severe 05/08/2022   Enteritis, enteropathogenic E. coli 04/29/2022   Diarrhea 04/28/2022   Multifocal pneumonia 04/23/2022   Erosive esophagitis 04/22/2022   Dizziness 04/21/2022   Intractable nausea and vomiting 04/21/2022   Dyspnea 04/20/2022   Hyponatremia 04/19/2022   Orthostatic hypotension 04/18/2022   Postural dizziness with near syncope 04/18/2022   Near syncope 04/17/2022   COPD (chronic obstructive pulmonary disease) ([place]) 04/17/2022   Hyperkalemia 04/17/2022   Abnormal LFTs 18/56/3149   Chronic systolic CHF (congestive heart failure) ([place]) 04/17/2022   Normocytic anemia 04/17/2022   GERD without esophagitis 02/12/2022   Recurrent falls    Generalized weakness    NSVT (nonsustained ventricular tachycardia) ([place]) 02/11/2022   Acute on chronic combined systolic (congestive) and diastolic (congestive) heart failure ([place]) 08/10/2021   Acute respiratory failure with hypoxia ([place])    HCAP (healthcare-associated pneumonia) 06/13/2021   Acute on chronic systolic CHF (congestive heart failure) ([place]) 06/13/2021   Elevated troponin 06/13/2021   CKD (chronic kidney disease), 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   Lung nodule 06/13/2021   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   COPD exacerbation ([place]) 06/13/2021   Volume depletion 12/15/2019   Hypotension 12/15/2019   Cardiorenal syndrome    Atrial fibrillation with RVR ([place])     Defibrillator discharge    Tachycardia 10/25/2019   CHF (congestive heart failure) ([place]) 09/18/2019   Acute on chronic combined systolic and diastolic CHF (congestive heart failure) ([place]) 09/17/2019   CAD (coronary artery disease) 09/17/2019   Ischemic cardiomyopathy 09/17/2019   CKD (chronic kidney disease) stage 3, GFR 30-59 ml/min ([name]) 09/17/2019   Acute hypoxemic respiratory failure ([place]) 09/17/2018   AAA (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 ([place]) 07/06/2018   Essential hypertension 07/06/2018   Hyperlipidemia 07/06/2018   Paroxysmal atrial fibrillation ([name])    Acute on chronic respiratory failure with hypoxia ([place]) 04/26/2018    Palliative Care Assessment & Plan     Recommendations/Plan: Antiemetic medications remain in place for N/V.  Continue full code/ full scope.   Code Status:    Code Status Orders  (From admission, onward)           Start     Ordered   04/17/22 1306  Full code  Continuous        04/17/22 1306           Code Status History     Date Active Date Inactive Code Status Order ID Comments User Context   02/12/2022 0156 02/13/2022 2152 Full Code [id number]  Christel Mormon, MD ED   08/10/2021 0928 08/12/2021 2007 Full Code [id number]  Ivor Costa, MD ED   08/10/2021 0534 08/10/2021 0928 Full Code [id number]  Rhetta Mura, DO ED   06/13/2021 0822 06/16/2021 2220 Full Code [id number]  Ivor Costa, MD ED   12/15/2019 1933 12/16/2019 1854 DNR [id number]  Flora Lipps, MD ED   10/26/2019 0027 10/29/2019 1329 Full Code [id number]  Elwyn Reach, MD ED   09/17/2019 0442 09/18/2019 2109 Full Code [id number]  Athena Masse, MD ED   09/17/2018 0953 09/21/2018 2027 Full Code [id number]  Bettey Costa, MD ED   04/26/2018 0253 05/13/2018 1923 Full Code [id number]  Amelia Jo, MD ED      Advance Directive Documentation    [place] Most  Recent Value  Type of Advance Directive Living will  Pre-existing out of facility DNR order (yellow form or Zamora MOST  form) --  "MOST" Form in Place? --       Prognosis:  Unable to determine    Care plan was discussed with RN  Thank you for allowing the Palliative Medicine Team to assist in the care of this patient.    Asencion Gowda, NP  Please contact Palliative Medicine Team phone at [phone] for questions and concerns.

## 2022-05-13 NOTE — Progress Notes (Signed)
Physical Therapy Treatment Patient Details Name: Matthew Zamora MRN: [id number] DOB: 08/13/1940 Today's Date: 05/13/2022   History of Present Illness Lizandro Spellman is an 34yoM presenting to ED with lightheadedness; PMH significant for  HTN, HLD, COPD, AF on Eliquis, CHF with EF of 35-40%, CAD, anemia, CKD-3B, frequent falls. Pt found to be orthostatic and presyncopal upon PT evaluation 04/18/22, orthostatic hypotension has been persistent and symptomatic.    PT Comments    Pt in bed on entry, TED hose doffed, HOB less than 25 degrees. Pt agreeable to session, remembers author from previous day. Pt reports feeling much better today despite an episode of emesis this morning. He vomited some of his pills, believes he may have aspirated some when taking pills when cause immediate reflex. Not all pills vomited, he's not sure which ones came up, no sure if he was reissued those vomited. Pt has no presyncope coming from supine to sitting, and later is able to perform 6 pivot transfers with ~30-45 sec recovery intervals between for pressure support, has no presyncope prodrome at this time either. Pt moves to high knee marching in place x20 wherein he does have onset dizziness, GI distress, and dyspnea. Attempts 90-120sec recovery between efforts, but pt is motivated to continue once partial symptoms resolution is subjectively adequate for him. At end of session, he reports achieving about 25% symptoms recovery with 2 minute interval. Reassessed leg dimensions for updated thigh high TED hose at end of session to obtain correct size. Hose handed off to RN.   Leg dimensions:  20cm ankle cir, 26.5cm calf cir  39cm lower leg length  32.5 thigh circum Full leg length 80cm      Recommendations for follow up therapy are one component of a multi-disciplinary discharge planning process, led by the attending physician.  Recommendations may be updated based on patient status, additional functional criteria  and insurance authorization.  Follow Up Recommendations  Skilled nursing-short term rehab (<3 hours/day) Can patient physically be transported by private vehicle: No   Assistance Recommended at Discharge Intermittent Supervision/Assistance  Patient can return home with the following A little help with bathing/dressing/bathroom;Assist for transportation;Help with stairs or ramp for entrance;Assistance with cooking/housework;A lot of help with walking and/or transfers   Equipment Recommendations  Other (comment) (defer to facility; will take ABD binder and thigh high ted hose with him)    Recommendations for Other Services       Precautions / Restrictions Precautions Precautions: Fall Precaution Comments: 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 Restrictions Weight Bearing Restrictions: No     Mobility  Bed Mobility Overal bed mobility: Modified Independent Bed Mobility: Supine to Sit, Sit to Supine           General bed mobility comments: no dizziness today; no pressures taken this date    Transfers Overall transfer level: Needs assistance Equipment used: Rolling walker (2 wheels), None Transfers: Sit to/from Stand, Bed to chair/wheelchair/BSC Sit to Stand: Supervision (3x in session)   Step pivot transfers: Min guard (no RW used, encouraged use of hands on bed and/or chair for safety; no symptoms with these when performed individiually)       General transfer comment: no pressures taken this session    Ambulation/Gait                   Stairs             Wheelchair Mobility    Modified Rankin (Stroke Patients Only)  Balance                                            Cognition Arousal/Alertness: Awake/alert Behavior During Therapy: WFL for tasks assessed/performed Overall Cognitive Status: Within Functional Limits for tasks assessed                                          Exercises  Other Exercises Other Exercises: Bed to/from chair 6x c 30-45 sec recovery interval between (no diziness with these today) Other Exercises: Stand to RW then 16x high knee reciprocal marching c RW: 3 sets c 2 minute recovery interval seated: (increased dizziness and dyspnea each time. ~"25%" recovery ofsymptoms each time.)    General Comments        Pertinent Vitals/Pain Pain Assessment Pain Assessment: No/denies pain    Home Living                          Prior Function            PT Goals (current goals can now be found in the care plan section) Acute Rehab PT Goals Patient Stated Goal: To return to walking PT Goal Formulation: With patient Time For Goal Achievement: 05/16/22 Potential to Achieve Goals: Poor Progress towards PT goals: Progressing toward goals    Frequency    Min 2X/week      PT Plan Current plan remains appropriate    Co-evaluation              AM-PAC PT "6 Clicks" Mobility   Outcome Measure  Help needed turning from your back to your side while in a flat bed without using bedrails?: None Help needed moving from lying on your back to sitting on the side of a flat bed without using bedrails?: None Help needed moving to and from a bed to a chair (including a wheelchair)?: A Little Help needed standing up from a chair using your arms (e.g., wheelchair or bedside chair)?: A Little Help needed to walk in hospital room?: A Little Help needed climbing 3-5 steps with a railing? : A Lot 6 Click Score: 19    End of Session Equipment Utilized During Treatment: Gait belt Activity Tolerance: Patient tolerated treatment well;Treatment limited secondary to medical complications (Comment) (prescibed recovery intervals for symptoms control/resolution) Patient left: in bed;with call bell/phone within reach Nurse Communication: Mobility status PT Visit Diagnosis: Unsteadiness on feet (R26.81);Muscle weakness (generalized) (M62.81);History of  falling (Z91.81);Difficulty in walking, not elsewhere classified (R26.2)     Time: 4259-5638 PT Time Calculation (min) (ACUTE ONLY): 29 min  Charges:  $Therapeutic Exercise: 23-37 mins                    12:30 PM, 05/13/22 Etta Grandchild, PT, DPT Physical Therapist - [facility]  [phone] ([place])   Lyssa Hackley C 05/13/2022, 12:17 PM

## 2022-05-13 NOTE — Progress Notes (Signed)
   05/13/22 1234  Medical Necessity for Transport Certificate --- IF THIS TRANSPORT IS ROUND TRIP OR SCHEDULED AND REPEATED, A PHYSICIAN MUST COMPLETE THIS FORM  Transport from: Teacher, English as a foreign language) Doctor, hospital to (Location) Other (Comment) Development worker, community)  Did the patient arrive from a [place], [place] or Group Home? No  Is this the closest appropriate facility? Yes  Date of Transport Service 05/13/22  Name of [facility]  Round Trip Transport? No  Reason for Transport Discharge  Is this a hospice patient? No  Describe the Medical Condition near syncope, nonsustained ventricular tachycardia, hyperkalemia, chronic kidney disease, paroxysmal atrial fibrillation, congestive heart failure, hypertension, COPD, weakness, pneumonia  Q1 Are ALL the following "true"? 1. Patient unable to get up from bed without assistance  AND  2. Unable to ambulate  AND  3. Unable to sit in a chair, including wheelchair. Yes  Q2 Could the patient be transported safely by other means of transportation (I.E., wheelchair van)? No  Q3 Please check any of the following conditions that apply at the time of transport: Risk of injury to self and/or others  Electronic Signature Colen Darling  Credentials DP  Date Signed 05/13/22

## 2022-05-13 NOTE — TOC Progression Note (Addendum)
Transition of Care [facility]) - Progression Note    Patient Details  Name: Matthew Zamora MRN: [id number] Date of Birth: 12/22/1940  Transition of Care [facility]) CM/SW [place], [place] Phone Number: 05/13/2022, 12:59 PM  Clinical Narrative:     TOC received a message from [facility] and this patient's insurance authorization has not been approved. The patient will need a provider to call [phone] by 4 pm on 11/27. Provider to state name, insurance member ID and DOB.   TOC provided MD with this information and is calling Erskin Burnet at [employer] back with this update.  1418: Patient now has approval from insurance from SNF. TOC calling Ricky at [employer] back regarding insurance approval and transfer to SNF.  Expected Discharge Plan: [place] Barriers to Discharge: Barriers Resolved  Expected Discharge Plan and Services Expected Discharge Plan: [place]   Discharge Planning Services: CM Consult     Expected Discharge Date: 05/13/22                                     Social Determinants of Health (SDOH) Interventions    Readmission Risk Interventions    04/22/2022    3:54 PM  Readmission Risk Prevention Plan  Transportation Screening Complete  Medication Review ([place]) Complete  PCP or Specialist appointment within 3-5 days of discharge Complete  HRI or [place] Complete  SW Recovery Care/Counseling Consult Complete  [address] Not Applicable

## 2022-05-23 ENCOUNTER — Other Ambulatory Visit: Payer: Self-pay

## 2022-05-29 ENCOUNTER — Ambulatory Visit: Payer: Medicare Other | Admitting: Gastroenterology

## 2022-09-14 ENCOUNTER — Emergency Department: Payer: Medicare Other

## 2022-09-14 ENCOUNTER — Emergency Department
Admission: EM | Admit: 2022-09-14 | Discharge: 2022-09-14 | Disposition: A | Discharge status: Home / Self Care | Payer: Medicare Other | Attending: Emergency Medicine | Admitting: Emergency Medicine

## 2022-09-14 DIAGNOSIS — I5022 Chronic systolic (congestive) heart failure: Secondary | ICD-10-CM | POA: Diagnosis not present

## 2022-09-14 DIAGNOSIS — H6011 Cellulitis of right external ear: Secondary | ICD-10-CM | POA: Diagnosis not present

## 2022-09-14 DIAGNOSIS — E785 Hyperlipidemia, unspecified: Secondary | ICD-10-CM

## 2022-09-14 DIAGNOSIS — S20211A Contusion of right front wall of thorax, initial encounter: Secondary | ICD-10-CM

## 2022-09-14 DIAGNOSIS — I130 Hypertensive heart and chronic kidney disease with heart failure and stage 1 through stage 4 chronic kidney disease, or unspecified chronic kidney disease: Secondary | ICD-10-CM | POA: Insufficient documentation

## 2022-09-14 DIAGNOSIS — R42 Dizziness and giddiness: Secondary | ICD-10-CM

## 2022-09-14 DIAGNOSIS — I509 Heart failure, unspecified: Secondary | ICD-10-CM | POA: Diagnosis not present

## 2022-09-14 DIAGNOSIS — N189 Chronic kidney disease, unspecified: Secondary | ICD-10-CM | POA: Insufficient documentation

## 2022-09-14 DIAGNOSIS — I951 Orthostatic hypotension: Secondary | ICD-10-CM | POA: Diagnosis not present

## 2022-09-14 DIAGNOSIS — I10 Essential (primary) hypertension: Secondary | ICD-10-CM

## 2022-09-14 DIAGNOSIS — N183 Chronic kidney disease, stage 3 unspecified: Secondary | ICD-10-CM

## 2022-09-14 DIAGNOSIS — I2510 Atherosclerotic heart disease of native coronary artery without angina pectoris: Secondary | ICD-10-CM | POA: Insufficient documentation

## 2022-09-14 DIAGNOSIS — W19XXXA Unspecified fall, initial encounter: Secondary | ICD-10-CM | POA: Insufficient documentation

## 2022-09-14 DIAGNOSIS — I2583 Coronary atherosclerosis due to lipid rich plaque: Secondary | ICD-10-CM

## 2022-09-14 DIAGNOSIS — J449 Chronic obstructive pulmonary disease, unspecified: Secondary | ICD-10-CM | POA: Diagnosis not present

## 2022-09-14 DIAGNOSIS — I129 Hypertensive chronic kidney disease with stage 1 through stage 4 chronic kidney disease, or unspecified chronic kidney disease: Secondary | ICD-10-CM

## 2022-09-14 DIAGNOSIS — S299XXA Unspecified injury of thorax, initial encounter: Secondary | ICD-10-CM | POA: Diagnosis present

## 2022-09-14 DIAGNOSIS — H938X1 Other specified disorders of right ear: Secondary | ICD-10-CM

## 2022-09-14 LAB — CBC WITH DIFFERENTIAL/PLATELET
Abs Immature Granulocytes: 0.02 10*3/uL (ref 0.00–0.07)
Basophils Absolute: 0 10*3/uL (ref 0.0–0.1)
Basophils Relative: 0 %
Eosinophils Absolute: 0.1 10*3/uL (ref 0.0–0.5)
Eosinophils Relative: 1 %
HCT: 34 % — ABNORMAL LOW (ref 39.0–52.0)
Hemoglobin: 10.8 g/dL — ABNORMAL LOW (ref 13.0–17.0)
Immature Granulocytes: 0 %
Lymphocytes Relative: 9 %
Lymphs Abs: 0.8 10*3/uL (ref 0.7–4.0)
MCH: 28.9 pg (ref 26.0–34.0)
MCHC: 31.8 g/dL (ref 30.0–36.0)
MCV: 90.9 fL (ref 80.0–100.0)
Monocytes Absolute: 0.7 10*3/uL (ref 0.1–1.0)
Monocytes Relative: 8 %
Neutro Abs: 7.2 10*3/uL (ref 1.7–7.7)
Neutrophils Relative %: 82 %
Platelets: 150 10*3/uL (ref 150–400)
RBC: 3.74 MIL/uL — ABNORMAL LOW (ref 4.22–5.81)
RDW: 14.6 % (ref 11.5–15.5)
WBC: 8.9 10*3/uL (ref 4.0–10.5)
nRBC: 0 % (ref 0.0–0.2)

## 2022-09-14 LAB — TROPONIN I (HIGH SENSITIVITY)
Troponin I (High Sensitivity): 29 ng/L — ABNORMAL HIGH (ref <18)
Troponin I (High Sensitivity): 27 ng/L — ABNORMAL HIGH (ref <18)

## 2022-09-14 LAB — COMPREHENSIVE METABOLIC PANEL
ALT: 17 U/L (ref 0–44)
AST: 21 U/L (ref 15–41)
Albumin: 3.7 g/dL (ref 3.5–5.0)
Alkaline Phosphatase: 73 U/L (ref 38–126)
Anion gap: 9 (ref 5–15)
BUN: 19 mg/dL (ref 8–23)
CO2: 24 mmol/L (ref 22–32)
Calcium: 9.6 mg/dL (ref 8.9–10.3)
Chloride: 103 mmol/L (ref 98–111)
Creatinine, Ser: 1.56 mg/dL — ABNORMAL HIGH (ref 0.61–1.24)
GFR, Estimated: 45 mL/min — ABNORMAL LOW (ref >60)
Glucose, Bld: 109 mg/dL — ABNORMAL HIGH (ref 70–99)
Potassium: 4 mmol/L (ref 3.5–5.1)
Sodium: 136 mmol/L (ref 135–145)
Total Bilirubin: 0.9 mg/dL (ref 0.3–1.2)
Total Protein: 6.6 g/dL (ref 6.5–8.1)

## 2022-09-14 LAB — LIPASE, BLOOD: Lipase: 44 U/L (ref 11–51)

## 2022-09-14 MED ORDER — CEPHALEXIN 500 MG PO CAPS: 500.0000 mg | ORAL_CAPSULE | Freq: Four times a day (QID) | ORAL | 0 refills | Status: AC

## 2022-09-14 MED ORDER — SODIUM CHLORIDE 0.9 % IV BOLUS
500.0000 mL | Freq: Once | INTRAVENOUS | Status: AC
  Administered 2022-09-14: 500 mL via INTRAVENOUS

## 2022-09-14 MED ORDER — ACETAMINOPHEN 500 MG PO TABS
1000.0000 mg | ORAL_TABLET | Freq: Once | ORAL | Status: AC
  Administered 2022-09-14: 1000 mg via ORAL
  Filled 2022-09-14: qty 2

## 2022-09-14 MED ORDER — LIDOCAINE-PRILOCAINE 2.5-2.5 % EX CREA
TOPICAL_CREAM | Freq: Once | CUTANEOUS | Status: AC
  Filled 2022-09-14: qty 5

## 2022-09-14 MED ORDER — LIDOCAINE HCL (PF) 1 % IJ SOLN
5.0000 mL | Freq: Once | INTRAMUSCULAR | Status: AC
  Administered 2022-09-14: 5 mL via INTRADERMAL
  Filled 2022-09-14: qty 5

## 2022-09-14 NOTE — Consult Note (Addendum)
Initial Consultation Note   Patient: Matthew Zamora [id number] DOB: Aug 25, 1940 PCP: [place] Pink, MD DOA: 09/14/2022 DOS: the patient was seen and examined on 09/14/2022 Primary service: No att. providers found  Referring physician: Siadecki  Reason for consult: orthostasis   Assessment/Plan: Assessment and Plan: Orthostasis-recurrent issue and patient relatively narrow hydration window in setting of CHF.  Symptomatically improved after fluid bolus in the ER.  Patient fairly adamant.  Patient wishes to go home as he is symptomatically improved. Discussed importance of compliance with medication regimen including midodrine and pyridostigmine.  Patient expressed understanding.  Will try to work on being more hydrated.  Discussed with patient if symptoms worsen to return to the ER.  Patient will also see his primary care physician at the beginning of the week for follow-up.  Patient with noted right ear sebaceous cyst that was addressed in the ER.  Discussed plan of care with Dr. Cherylann Banas.  He feels patient appropriate for discharge.     TRH will sign off at present, please call us again when needed.  HPI: Matthew Zamora is a 82 y.o. 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  Patient reports dizziness with ambulation earlier today.  Noted admission for similar symptoms back in November 2023.  Patient had formal evaluation including hydration as well as Case discussion with EP cardiologist Dr. Caryl Comes with some concern for autonomic failure.  Patient was placed on scheduled midodrine and pyridostigmine.  Patient reports being compliant with medication.  No change in diet.  Has had some recent falls including a fall with right-sided rib pain.  No fevers or chills.  No chest pain or shortness of breath.  No hemiparesis or confusion.  Has some recurrent  dizziness associated with standing earlier today.  Some concern for dehydration. Presented to the ER afebrile, systolic pressures drop into the 90s with ambulation with positive dizziness.  White count 8.9, hemoglobin 10.8, troponin 27 which appears to be chronic, creatinine 1.56.  EKG normal sinus rhythm.  Rib plain films stable.  Patient reports symptomatic improvement after IV fluid bolus in the ER.  Is requesting to go home if he can pass his orthostasis.  Review of Systems: As mentioned in the history of present illness. All other systems reviewed and are negative. 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 pylori infection    History of hepatitis    Hyperlipidemia    Hypertension    Liver cyst 05/16/15   PAF (paroxysmal atrial fibrillation) ([place])    Prostate CA [facility])    Past Surgical History:  Procedure Laterality Date   CATARACT EXTRACTION     COLONOSCOPY  10/06/2014, 09/18/2004, 03/14/2009   ESOPHAGOGASTRODUODENOSCOPY  10/06/2014, 03/18/2001, 03/14/2009   ESOPHAGOGASTRODUODENOSCOPY (EGD) WITH PROPOFOL N/A 05/07/2018   Procedure: ESOPHAGOGASTRODUODENOSCOPY (EGD) WITH PROPOFOL;  Surgeon: Toledo, [name], MD;  Location: [facility] ENDOSCOPY;  Service: Gastroenterology;  Laterality: N/A;   ESOPHAGOGASTRODUODENOSCOPY (EGD) WITH PROPOFOL N/A 04/22/2022   Procedure: ESOPHAGOGASTRODUODENOSCOPY (EGD) WITH PROPOFOL;  Surgeon: Lin Landsman, MD;  Location: [facility] ENDOSCOPY;  Service: Endoscopy;  Laterality: N/A;   FLEXIBLE SIGMOIDOSCOPY  08/26/1990   INSERTION OF ICD     PROSTATE SURGERY     TONSILLECTOMY     Social History:  reports that he quit smoking  about 23 years ago. His smoking use included cigarettes. He has a 38.00 pack-year smoking history. He has never used smokeless tobacco. He reports that he does not currently use drugs. He reports that he does not drink alcohol.  Allergies  Allergen Reactions   Nitrofuran Derivatives Other (See Comments)    Transaminitis ** confounded w/Amiodarone, Mexiletine   Spironolactone     Significant transaminitis     Family History  Problem Relation Age of Onset   Heart attack Mother    Heart attack Father     Prior to Admission medications   Medication Sig Start Date End Date Taking? Authorizing Provider  cephALEXin (KEFLEX) 500 MG capsule Take 1 capsule (500 mg total) by mouth 4 (four) times daily for 7 days. 09/14/22 09/21/22 Yes Arta Silence, MD  acetaminophen (TYLENOL) 325 MG tablet Take 2 tablets (650 mg total) by mouth every 4 (four) hours as needed for headache or mild pain. 10/29/19   Bradly Bienenstock, NP  albuterol (PROVENTIL) (2.5 MG/3ML) 0.083% nebulizer solution Inhale 3 mLs (2.5 mg total) into the lungs as needed for shortness of breath. 10/29/19   Bradly Bienenstock, NP  albuterol (VENTOLIN HFA) 108 (90 Base) MCG/ACT inhaler Inhale 2 puffs into the lungs every 4 (four) hours as needed. 04/21/21   [provider]  ascorbic acid (VITAMIN C) 500 MG tablet Take 1 tablet by mouth daily.    [provider]  aspirin 81 MG chewable tablet Chew 81 mg by mouth daily at 12 noon. 03/30/21   [provider]  atorvastatin (LIPITOR) 80 MG tablet Take 80 mg by mouth daily. 05/13/21   [provider]  calcium carbonate (TUMS EX) 750 MG chewable tablet Chew 1 tablet by mouth daily.    [provider]  ELIQUIS 2.5 MG TABS tablet Take 2.5 mg by mouth 2 (two) times daily. 12/28/21   [provider]  fluticasone-salmeterol (ADVAIR) 500-50 MCG/ACT AEPB Inhale 1 puff into the lungs in the morning and at bedtime. 04/05/22   [provider]   gabapentin (NEURONTIN) 100 MG capsule Take 100 mg by mouth at bedtime. 12/09/19   [provider]  magnesium oxide (MAG-OX) 400 (240 Mg) MG tablet Take 1 tablet by mouth daily. 02/05/21   [provider]  melatonin 3 MG TABS tablet Take 9 mg by mouth at bedtime.    [provider]  metoCLOPramide (REGLAN) 5 MG tablet Take 1 tablet (5 mg total) by mouth every 8 (eight) hours as needed for refractory nausea / vomiting. 05/13/22   Max Sane, MD  midodrine (PROAMATINE) 5 MG tablet Take 3 tablets (15 mg total) by mouth 3 (three) times daily with meals. 05/13/22   Max Sane, MD  Multiple Vitamin (MULTIVITAMIN WITH MINERALS) TABS tablet Take 1 tablet by mouth daily. 10/29/19   Bradly Bienenstock, NP  Nebulizer MISC 1 each by Does not apply route as needed. 06/16/21   Lavina Hamman, MD  Nutritional Supplements (,FEEDING SUPPLEMENT, PROSOURCE PLUS) liquid Take 30 mLs by mouth 3 (three) times daily between meals. 05/13/22   Max Sane, MD  ondansetron (ZOFRAN-ODT) 4 MG disintegrating tablet Take 4 mg by mouth every 8 (eight) hours as needed for refractory nausea / vomiting,  vomiting or nausea.    [provider]  pantoprazole (PROTONIX) 40 MG tablet Take 1 tablet (40 mg total) by mouth 2 (two) times daily. 05/13/22   Max Sane, MD  pyridostigmine (MESTINON) 60 MG tablet Take 1 tablet (60 mg total) by mouth every 8 (eight) hours. 05/13/22   Max Sane, MD  sertraline (ZOLOFT) 25 MG tablet Take 25 mg by mouth daily.    [provider]  spironolactone (ALDACTONE) 25 MG tablet Take 25 mg by mouth daily. 05/05/22   [provider]  sucralfate (CARAFATE) 1 GM/10ML suspension Take 10 mLs (1 g total) by mouth 4 (four) times daily -  with meals and at bedtime. 05/13/22   Max Sane, MD  tadalafil (CIALIS) 20 MG tablet Take 20 mg by mouth daily. 09/04/21   [provider]  tamsulosin (FLOMAX) 0.4 MG CAPS capsule Take 0.4 mg by mouth daily. 12/09/19    [provider]    Physical Exam: Vitals:   09/14/22 1230 09/14/22 1315 09/14/22 1318 09/14/22 1330  BP: 124/63 133/60    Pulse: 87 73  72  Resp: 20 (!) 23  18  Temp:   98.4 F (36.9 C)   TempSrc:   Oral   SpO2: 96% 98%  96%  Weight:      Height:       Physical Exam Constitutional:      Appearance: He is normal weight.  HENT:     Head:     Comments: Right earlobe swelling and redness status post I&D at the bedside by ER physician    Mouth/Throat:     Mouth: Mucous membranes are moist.  Eyes:     Pupils: Pupils are equal, round, and reactive to light.  Cardiovascular:     Rate and Rhythm: Normal rate and regular rhythm.  Pulmonary:     Effort: Pulmonary effort is normal.     Breath sounds: Normal breath sounds.  Abdominal:     General: Bowel sounds are normal.  Musculoskeletal:        General: Normal range of motion.  Skin:    General: Skin is warm.  Neurological:     General: No focal deficit present.  Psychiatric:        Mood and Affect: Mood normal.     Data Reviewed:   There are no new results to review at this time.  DG Ribs Unilateral W/Chest Right CLINICAL DATA:  82 year old male with history of trauma from a fall complaining of right-sided rib pain.  EXAM: RIGHT RIBS AND CHEST - 3+ VIEW  COMPARISON:  Chest x-ray 05/02/2022.  FINDINGS: Lung volumes are normal. No consolidative airspace disease. No pleural effusions. No pneumothorax. No pulmonary nodule or mass noted. Pulmonary vasculature and the cardiomediastinal silhouette are within normal limits. Atherosclerotic calcifications are noted in the thoracic aorta. Left-sided pacemaker/AICD with lead tips projecting over the expected location of the right atrium and right ventricle.  Dedicated views of the right ribs demonstrate no acute displaced right-sided rib fractures.  IMPRESSION: 1. No acute displaced right-sided rib fractures. 2. No radiographic evidence of acute  cardiopulmonary disease. 3. Aortic atherosclerosis.  Electronically Signed   By: Vinnie Langton M.D.   On: 09/14/2022 07:25  Lab Results  Component Value Date   WBC 8.9 09/14/2022   HGB 10.8 (L) 09/14/2022   HCT 34.0 (L) 09/14/2022   MCV 90.9 09/14/2022   PLT 150 [id number]   Last metabolic panel Lab Results  Component Value Date  GLUCOSE 109 (H) 09/14/2022   NA 136 09/14/2022   K 4.0 09/14/2022   CL 103 09/14/2022   CO2 24 09/14/2022   BUN 19 09/14/2022   CREATININE 1.56 (H) 09/14/2022   GFRNONAA 45 (L) 09/14/2022   CALCIUM 9.6 09/14/2022   PHOS 4.1 05/06/2022   PROT 6.6 09/14/2022   ALBUMIN 3.7 09/14/2022   LABGLOB 2.5 04/29/2018   AGRATIO 0.9 04/29/2018   BILITOT 0.9 09/14/2022   ALKPHOS 73 09/14/2022   AST 21 09/14/2022   ALT 17 09/14/2022   ANIONGAP 9 09/14/2022      Family Communication: No family at the bedside  Primary team communication: Yes  Thank you very much for involving [place] in the care of your patient.  Author: Deneise Lever, MD 09/14/2022 2:35 PM  For on call review [email or web address].
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
## 2022-09-14 NOTE — ED Triage Notes (Signed)
Patient arrives from EMS C/O right rib pain from a fall that occurred three days ago. He is also C/O abscess on his right ear that he states appeared two days ago.

## 2022-09-14 NOTE — Discharge Instructions (Signed)
You should replace the dressing on your ear once or twice daily.  Keep the area clean and dry.  Apply bacitracin or Neosporin ointment to the wound.  Take the antibiotic as prescribed and finish the full course.  Return to the ER for new, worsening, or persistent ear swelling or pain, drainage or bleeding, fever or chills, chest pain, difficulty breathing, weakness or lightheadedness, recurrent episodes of dizziness, or any other new or worsening symptoms that concern you.

## 2022-09-14 NOTE — ED Notes (Signed)
Pt felt dizzy when standing to do orthostatic vitals. Pt very wobbly, almost passed out. Pt sat back in bed.

## 2022-10-15 ENCOUNTER — Emergency Department: Payer: Medicare Other

## 2022-10-15 ENCOUNTER — Other Ambulatory Visit: Payer: Self-pay

## 2022-10-15 ENCOUNTER — Emergency Department
Admission: EM | Admit: 2022-10-15 | Discharge: 2022-10-15 | Disposition: A | Discharge status: Home / Self Care | Payer: Medicare Other | Attending: Emergency Medicine | Admitting: Emergency Medicine

## 2022-10-15 DIAGNOSIS — R079 Chest pain, unspecified: Secondary | ICD-10-CM | POA: Diagnosis present

## 2022-10-15 DIAGNOSIS — J449 Chronic obstructive pulmonary disease, unspecified: Secondary | ICD-10-CM | POA: Diagnosis not present

## 2022-10-15 DIAGNOSIS — R911 Solitary pulmonary nodule: Secondary | ICD-10-CM

## 2022-10-15 DIAGNOSIS — N189 Chronic kidney disease, unspecified: Secondary | ICD-10-CM | POA: Insufficient documentation

## 2022-10-15 DIAGNOSIS — Z95810 Presence of automatic (implantable) cardiac defibrillator: Secondary | ICD-10-CM | POA: Insufficient documentation

## 2022-10-15 DIAGNOSIS — I2510 Atherosclerotic heart disease of native coronary artery without angina pectoris: Secondary | ICD-10-CM | POA: Insufficient documentation

## 2022-10-15 DIAGNOSIS — I509 Heart failure, unspecified: Secondary | ICD-10-CM | POA: Insufficient documentation

## 2022-10-15 DIAGNOSIS — I4891 Unspecified atrial fibrillation: Secondary | ICD-10-CM | POA: Diagnosis not present

## 2022-10-15 DIAGNOSIS — Z9981 Dependence on supplemental oxygen: Secondary | ICD-10-CM | POA: Insufficient documentation

## 2022-10-15 DIAGNOSIS — Z7901 Long term (current) use of anticoagulants: Secondary | ICD-10-CM | POA: Insufficient documentation

## 2022-10-15 DIAGNOSIS — I130 Hypertensive heart and chronic kidney disease with heart failure and stage 1 through stage 4 chronic kidney disease, or unspecified chronic kidney disease: Secondary | ICD-10-CM | POA: Insufficient documentation

## 2022-10-15 LAB — BASIC METABOLIC PANEL
Anion gap: 8 (ref 5–15)
BUN: 19 mg/dL (ref 8–23)
CO2: 26 mmol/L (ref 22–32)
Calcium: 9.4 mg/dL (ref 8.9–10.3)
Chloride: 104 mmol/L (ref 98–111)
Creatinine, Ser: 1.5 mg/dL — ABNORMAL HIGH (ref 0.61–1.24)
GFR, Estimated: 47 mL/min — ABNORMAL LOW (ref >60)
Glucose, Bld: 100 mg/dL — ABNORMAL HIGH (ref 70–99)
Potassium: 4.7 mmol/L (ref 3.5–5.1)
Sodium: 138 mmol/L (ref 135–145)

## 2022-10-15 LAB — CBC WITH DIFFERENTIAL/PLATELET
Abs Immature Granulocytes: 0.03 10*3/uL (ref 0.00–0.07)
Basophils Absolute: 0.1 10*3/uL (ref 0.0–0.1)
Basophils Relative: 1 %
Eosinophils Absolute: 0.2 10*3/uL (ref 0.0–0.5)
Eosinophils Relative: 2 %
HCT: 31.1 % — ABNORMAL LOW (ref 39.0–52.0)
Hemoglobin: 9.6 g/dL — ABNORMAL LOW (ref 13.0–17.0)
Immature Granulocytes: 0 %
Lymphocytes Relative: 8 %
Lymphs Abs: 0.7 10*3/uL (ref 0.7–4.0)
MCH: 28.7 pg (ref 26.0–34.0)
MCHC: 30.9 g/dL (ref 30.0–36.0)
MCV: 92.8 fL (ref 80.0–100.0)
Monocytes Absolute: 0.8 10*3/uL (ref 0.1–1.0)
Monocytes Relative: 11 %
Neutro Abs: 6.2 10*3/uL (ref 1.7–7.7)
Neutrophils Relative %: 78 %
Platelets: 193 10*3/uL (ref 150–400)
RBC: 3.35 MIL/uL — ABNORMAL LOW (ref 4.22–5.81)
RDW: 14.2 % (ref 11.5–15.5)
WBC: 7.9 10*3/uL (ref 4.0–10.5)
nRBC: 0 % (ref 0.0–0.2)

## 2022-10-15 LAB — MAGNESIUM: Magnesium: 1.8 mg/dL (ref 1.7–2.4)

## 2022-10-15 LAB — TROPONIN I (HIGH SENSITIVITY): Troponin I (High Sensitivity): 17 ng/L (ref <18)

## 2022-10-15 MED ORDER — IOHEXOL 350 MG/ML SOLN
75.0000 mL | Freq: Once | INTRAVENOUS | Status: AC | PRN
  Administered 2022-10-15: 60 mL via INTRAVENOUS

## 2022-10-15 MED ORDER — ONDANSETRON HCL 4 MG/2ML IJ SOLN
INTRAMUSCULAR | Status: AC
  Filled 2022-10-15: qty 2

## 2022-10-15 MED ORDER — MORPHINE SULFATE (PF) 4 MG/ML IV SOLN
4.0000 mg | Freq: Once | INTRAVENOUS | Status: AC
  Administered 2022-10-15: 4 mg via INTRAVENOUS
  Filled 2022-10-15: qty 1

## 2022-10-15 MED ORDER — ONDANSETRON HCL 4 MG/2ML IJ SOLN
4.0000 mg | Freq: Once | INTRAMUSCULAR | Status: AC
  Administered 2022-10-15: 4 mg via INTRAVENOUS

## 2022-10-15 NOTE — ED Provider Notes (Signed)
[facility] Provider Note    Event Date/Time   First MD Initiated Contact with Patient 10/15/22 [phone]     (approximate)   History   Chief Complaint Chest Pain   HPI  Matthew Zamora is a 82 y.o. male with past medical history of hypertension, hyperlipidemia, CAD, CHF, atrial fibrillation on Eliquis, VT status post AICD, COPD, and chronic hypoxic respiratory failure on 2 L nasal cannula who presents to the ED complaining of chest pain.  Patient reports that he has been dealing with constant pain in the left side of his chest since last night.  He describes it as a sharp pain that is worse when he takes a deep breath or when he moves a certain way.  Family states that patient had a discharge of his AICD last week and per chart review patient was shocked due to rapid A-fib.  He subsequently was restarted on metoprolol, had follow-up with his cardiologist at [facility] where his AICD settings were adjusted.  He does report feeling short of breath with his symptoms, denies any fevers, cough, or leg swelling.     Physical Exam   Triage Vital Signs: ED Triage Vitals  Enc Vitals Group     BP 10/15/22 0758 (!) 137/92     Pulse Rate 10/15/22 0758 98     Resp 10/15/22 0758 (!) 22     Temp 10/15/22 0758 98.5 F (36.9 C)     Temp Source 10/15/22 0758 Oral     SpO2 10/15/22 0758 100 %     Weight 10/15/22 0757 133 lb (60.3 kg)     Height 10/15/22 0757 5\' 8"  (1.727 m)     Head Circumference --      Peak Flow --      Pain Score 10/15/22 0757 3     Pain Loc --      Pain Edu? --      Excl. in GC? --     Most recent vital signs: Vitals:   10/15/22 0900 10/15/22 0930  BP: 124/72 125/72  Pulse: (!) 114 (!) 108  Resp: 20 14  Temp:    SpO2: 100% 100%    Constitutional: Alert and oriented. Eyes: Conjunctivae are normal. Head: Atraumatic. Nose: No congestion/rhinnorhea. Mouth/Throat: Mucous membranes are moist.  Cardiovascular: Tachycardic, irregularly irregular  rhythm. Grossly normal heart sounds.  2+ radial pulses bilaterally. Respiratory: Normal respiratory effort.  No retractions. Lungs CTAB.  Patient with left chest wall tenderness to palpation, AICD site without erythema or warmth. Gastrointestinal: Soft and nontender. No distention. Musculoskeletal: No lower extremity tenderness nor edema.  Neurologic:  Normal speech and language. No gross focal neurologic deficits are appreciated.    ED Results / Procedures / Treatments   Labs (all labs ordered are listed, but only abnormal results are displayed) Labs Reviewed  CBC WITH DIFFERENTIAL/PLATELET - Abnormal; Notable for the following components:      Result Value   RBC 3.35 (*)    Hemoglobin 9.6 (*)    HCT 31.1 (*)    All other components within normal limits  BASIC METABOLIC PANEL - Abnormal; Notable for the following components:   Glucose, Bld 100 (*)    Creatinine, Ser 1.50 (*)    GFR, Estimated 47 (*)    All other components within normal limits  MAGNESIUM  TROPONIN I (HIGH SENSITIVITY)     EKG  ED ECG REPORT I, Chesley Noon, the attending physician, personally viewed and interpreted this ECG.  Date: 10/15/2022  EKG Time: 8:05  Rate: 102  Rhythm: atrial fibrillation  Axis: Normal  Intervals:nonspecific intraventricular conduction delay  ST&T Change: None  RADIOLOGY CTA chest reviewed and interpreted by me with no pulmonary embolism.  PROCEDURES:  Critical Care performed: No  Procedures   MEDICATIONS ORDERED IN ED: Medications  morphine (PF) 4 MG/ML injection 4 mg (4 mg Intravenous Given 10/15/22 0834)  ondansetron (ZOFRAN) injection 4 mg (4 mg Intravenous Given 10/15/22 0834)  iohexol (OMNIPAQUE) 350 MG/ML injection 75 mL (60 mLs Intravenous Contrast Given 10/15/22 0910)     IMPRESSION / MDM / ASSESSMENT AND PLAN / ED COURSE  I reviewed the triage vital signs and the nursing notes.                              82 y.o. male with past medical history of  hypertension, hyperlipidemia, CAD, CHF, atrial fibrillation on Eliquis, COPD, chronic hypoxic respiratory failure on 2 L nasal cannula, and VT status post AICD who presents to the ED complaining of constant pain in the left side of his chest since last night.  Patient's presentation is most consistent with acute presentation with potential threat to life or bodily function.  Differential diagnosis includes, but is not limited to, ACS, arrhythmia, PE, dissection, pneumonia, pneumothorax, COPD exacerbation, bronchitis, musculoskeletal pain, GERD, anxiety, anemia.  Patient chronically ill but nontoxic-appearing and in no acute distress, vital signs remarkable for mild tachypnea and tachycardia but otherwise reassuring.  EKG shows atrial fibrillation with rate around 100, no ischemic changes noted.  Symptoms seem atypical for ACS, especially given pain is reproducible upon palpation.  No signs of infection at his AICD site, will treat symptomatically with IV morphine, labs are pending at this time.  We will also check CTA chest to rule out PE given pleuritic pain and some difficulty breathing.  No significant wheezing or rails noted on exam.  CTA chest is negative for PE, does show new pulmonary nodule which patient and family were informed of.  Referral provided to the pulmonary nodule clinic for follow-up.  Labs are reassuring with stable chronic kidney disease, no acute electrolyte abnormality, anemia, or leukocytosis.  Troponin within normal limits and with constant atypical pain since yesterday, I doubt ACS.  Patient reports pain has resolved on reassessment, he remains in A-fib but with heart rate in the 90s.  With reassuring workup, he is appropriate for discharge home with follow-up with his cardiologist at [facility].  He was counseled to return to the ED for new or worsening symptoms, patient and daughter-in-law agree with plan.      FINAL CLINICAL IMPRESSION(S) / ED DIAGNOSES   Final diagnoses:   Nonspecific chest pain  Pulmonary nodule     Rx / DC Orders   ED Discharge Orders          Ordered    AMB  Referral to Pulmonary Nodule Clinic        10/15/22 0950             Note:  This document was prepared using Dragon voice recognition software and may include unintentional dictation errors.   Chesley Noon, MD 10/15/22 [phone]

## 2022-10-15 NOTE — ED Triage Notes (Signed)
Pt arrives via EMS from home for CP onset last night. Pt thought it was indigestion because he ate chili beans. Pt has hx a-fib, AICD, and COPD. Per family, AICD was "adjusted" and pt reports it "went off" last week. Pt reports pain is worse with movement and deep breathing. He says it feels like his COPD. Pt arrives axo x4 and is on 2lpm oxygen via [place], which is what he wears at home. Pt does not appear to be in respiratory distress at this time.

## 2022-10-30 ENCOUNTER — Inpatient Hospital Stay
Admission: EM | Admit: 2022-10-30 | Discharge: 2022-11-07 | DRG: 521 | Disposition: A | Discharge status: Skilled Nursing Facility | Payer: Medicare Other | Attending: Student in an Organized Health Care Education/Training Program | Admitting: Student in an Organized Health Care Education/Training Program

## 2022-10-30 ENCOUNTER — Other Ambulatory Visit: Payer: Self-pay

## 2022-10-30 ENCOUNTER — Emergency Department: Payer: Medicare Other

## 2022-10-30 DIAGNOSIS — Y92008 Other place in unspecified non-institutional (private) residence as the place of occurrence of the external cause: Secondary | ICD-10-CM | POA: Diagnosis not present

## 2022-10-30 DIAGNOSIS — Z8546 Personal history of malignant neoplasm of prostate: Secondary | ICD-10-CM | POA: Diagnosis not present

## 2022-10-30 DIAGNOSIS — S72091A Other fracture of head and neck of right femur, initial encounter for closed fracture: Secondary | ICD-10-CM | POA: Diagnosis present

## 2022-10-30 DIAGNOSIS — J449 Chronic obstructive pulmonary disease, unspecified: Secondary | ICD-10-CM | POA: Diagnosis present

## 2022-10-30 DIAGNOSIS — N1832 Chronic kidney disease, stage 3b: Secondary | ICD-10-CM | POA: Diagnosis present

## 2022-10-30 DIAGNOSIS — D62 Acute posthemorrhagic anemia: Secondary | ICD-10-CM | POA: Diagnosis not present

## 2022-10-30 DIAGNOSIS — N183 Chronic kidney disease, stage 3 unspecified: Secondary | ICD-10-CM | POA: Diagnosis present

## 2022-10-30 DIAGNOSIS — I9589 Other hypotension: Secondary | ICD-10-CM | POA: Diagnosis present

## 2022-10-30 DIAGNOSIS — I252 Old myocardial infarction: Secondary | ICD-10-CM

## 2022-10-30 DIAGNOSIS — Z01818 Encounter for other preprocedural examination: Secondary | ICD-10-CM | POA: Diagnosis not present

## 2022-10-30 DIAGNOSIS — E611 Iron deficiency: Secondary | ICD-10-CM | POA: Diagnosis present

## 2022-10-30 DIAGNOSIS — Z8249 Family history of ischemic heart disease and other diseases of the circulatory system: Secondary | ICD-10-CM

## 2022-10-30 DIAGNOSIS — I480 Paroxysmal atrial fibrillation: Secondary | ICD-10-CM | POA: Diagnosis present

## 2022-10-30 DIAGNOSIS — W19XXXA Unspecified fall, initial encounter: Secondary | ICD-10-CM | POA: Diagnosis not present

## 2022-10-30 DIAGNOSIS — K219 Gastro-esophageal reflux disease without esophagitis: Secondary | ICD-10-CM | POA: Diagnosis present

## 2022-10-30 DIAGNOSIS — M80051A Age-related osteoporosis with current pathological fracture, right femur, initial encounter for fracture: Principal | ICD-10-CM | POA: Diagnosis present

## 2022-10-30 DIAGNOSIS — D649 Anemia, unspecified: Secondary | ICD-10-CM | POA: Diagnosis present

## 2022-10-30 DIAGNOSIS — E785 Hyperlipidemia, unspecified: Secondary | ICD-10-CM | POA: Diagnosis present

## 2022-10-30 DIAGNOSIS — R54 Age-related physical debility: Secondary | ICD-10-CM | POA: Diagnosis present

## 2022-10-30 DIAGNOSIS — Z87891 Personal history of nicotine dependence: Secondary | ICD-10-CM | POA: Diagnosis not present

## 2022-10-30 DIAGNOSIS — S72001A Fracture of unspecified part of neck of right femur, initial encounter for closed fracture: Secondary | ICD-10-CM | POA: Diagnosis not present

## 2022-10-30 DIAGNOSIS — I255 Ischemic cardiomyopathy: Secondary | ICD-10-CM | POA: Diagnosis present

## 2022-10-30 DIAGNOSIS — Z1152 Encounter for screening for COVID-19: Secondary | ICD-10-CM | POA: Diagnosis not present

## 2022-10-30 DIAGNOSIS — I4901 Ventricular fibrillation: Secondary | ICD-10-CM | POA: Diagnosis not present

## 2022-10-30 DIAGNOSIS — I472 Ventricular tachycardia, unspecified: Secondary | ICD-10-CM | POA: Diagnosis not present

## 2022-10-30 DIAGNOSIS — Z7901 Long term (current) use of anticoagulants: Secondary | ICD-10-CM

## 2022-10-30 DIAGNOSIS — R0902 Hypoxemia: Secondary | ICD-10-CM | POA: Diagnosis present

## 2022-10-30 DIAGNOSIS — Z7982 Long term (current) use of aspirin: Secondary | ICD-10-CM

## 2022-10-30 DIAGNOSIS — Z955 Presence of coronary angioplasty implant and graft: Secondary | ICD-10-CM

## 2022-10-30 DIAGNOSIS — S7290XA Unspecified fracture of unspecified femur, initial encounter for closed fracture: Secondary | ICD-10-CM | POA: Insufficient documentation

## 2022-10-30 DIAGNOSIS — Z79899 Other long term (current) drug therapy: Secondary | ICD-10-CM

## 2022-10-30 DIAGNOSIS — Z7951 Long term (current) use of inhaled steroids: Secondary | ICD-10-CM

## 2022-10-30 DIAGNOSIS — E875 Hyperkalemia: Secondary | ICD-10-CM | POA: Diagnosis present

## 2022-10-30 DIAGNOSIS — Z95810 Presence of automatic (implantable) cardiac defibrillator: Secondary | ICD-10-CM

## 2022-10-30 DIAGNOSIS — W010XXA Fall on same level from slipping, tripping and stumbling without subsequent striking against object, initial encounter: Secondary | ICD-10-CM | POA: Diagnosis present

## 2022-10-30 DIAGNOSIS — M810 Age-related osteoporosis without current pathological fracture: Secondary | ICD-10-CM

## 2022-10-30 DIAGNOSIS — I5043 Acute on chronic combined systolic (congestive) and diastolic (congestive) heart failure: Secondary | ICD-10-CM | POA: Diagnosis not present

## 2022-10-30 DIAGNOSIS — I2510 Atherosclerotic heart disease of native coronary artery without angina pectoris: Secondary | ICD-10-CM | POA: Diagnosis present

## 2022-10-30 DIAGNOSIS — I959 Hypotension, unspecified: Secondary | ICD-10-CM | POA: Diagnosis present

## 2022-10-30 DIAGNOSIS — I130 Hypertensive heart and chronic kidney disease with heart failure and stage 1 through stage 4 chronic kidney disease, or unspecified chronic kidney disease: Secondary | ICD-10-CM | POA: Diagnosis present

## 2022-10-30 DIAGNOSIS — E861 Hypovolemia: Secondary | ICD-10-CM | POA: Diagnosis not present

## 2022-10-30 DIAGNOSIS — Z9981 Dependence on supplemental oxygen: Secondary | ICD-10-CM | POA: Diagnosis not present

## 2022-10-30 DIAGNOSIS — Z751 Person awaiting admission to adequate facility elsewhere: Secondary | ICD-10-CM

## 2022-10-30 LAB — CBC WITH DIFFERENTIAL/PLATELET
Abs Immature Granulocytes: 0.04 10*3/uL (ref 0.00–0.07)
Basophils Absolute: 0.1 10*3/uL (ref 0.0–0.1)
Basophils Relative: 1 %
Eosinophils Absolute: 0.1 10*3/uL (ref 0.0–0.5)
Eosinophils Relative: 1 %
HCT: 32.1 % — ABNORMAL LOW (ref 39.0–52.0)
Hemoglobin: 10.1 g/dL — ABNORMAL LOW (ref 13.0–17.0)
Immature Granulocytes: 1 %
Lymphocytes Relative: 8 %
Lymphs Abs: 0.7 10*3/uL (ref 0.7–4.0)
MCH: 29.5 pg (ref 26.0–34.0)
MCHC: 31.5 g/dL (ref 30.0–36.0)
MCV: 93.9 fL (ref 80.0–100.0)
Monocytes Absolute: 0.6 10*3/uL (ref 0.1–1.0)
Monocytes Relative: 6 %
Neutro Abs: 7.4 10*3/uL (ref 1.7–7.7)
Neutrophils Relative %: 83 %
Platelets: 180 10*3/uL (ref 150–400)
RBC: 3.42 MIL/uL — ABNORMAL LOW (ref 4.22–5.81)
RDW: 14.2 % (ref 11.5–15.5)
WBC: 8.9 10*3/uL (ref 4.0–10.5)
nRBC: 0 % (ref 0.0–0.2)

## 2022-10-30 LAB — BASIC METABOLIC PANEL
Anion gap: 9 (ref 5–15)
BUN: 15 mg/dL (ref 8–23)
CO2: 23 mmol/L (ref 22–32)
Calcium: 8.9 mg/dL (ref 8.9–10.3)
Chloride: 102 mmol/L (ref 98–111)
Creatinine, Ser: 1.49 mg/dL — ABNORMAL HIGH (ref 0.61–1.24)
GFR, Estimated: 47 mL/min — ABNORMAL LOW (ref >60)
Glucose, Bld: 105 mg/dL — ABNORMAL HIGH (ref 70–99)
Potassium: 4.7 mmol/L (ref 3.5–5.1)
Sodium: 134 mmol/L — ABNORMAL LOW (ref 135–145)

## 2022-10-30 MED ORDER — LACTATED RINGERS IV SOLN: INTRAVENOUS | Status: DC

## 2022-10-30 MED ORDER — MORPHINE SULFATE (PF) 4 MG/ML IV SOLN
4.0000 mg | Freq: Once | INTRAVENOUS | Status: AC
  Administered 2022-10-30: 4 mg via INTRAVENOUS
  Filled 2022-10-30: qty 1

## 2022-10-30 MED ORDER — OXYCODONE HCL 5 MG PO TABS
5.0000 mg | ORAL_TABLET | ORAL | Status: DC | PRN
  Administered 2022-10-31 – 2022-11-01 (×3): 5 mg via ORAL
  Filled 2022-10-30 (×3): qty 1

## 2022-10-30 MED ORDER — ONDANSETRON HCL 4 MG/2ML IJ SOLN
4.0000 mg | Freq: Once | INTRAMUSCULAR | Status: AC
  Administered 2022-10-30: 4 mg via INTRAVENOUS
  Filled 2022-10-30: qty 2

## 2022-10-30 MED ORDER — SODIUM CHLORIDE 0.9% FLUSH
3.0000 mL | Freq: Two times a day (BID) | INTRAVENOUS | Status: DC
  Administered 2022-10-30 – 2022-11-07 (×13): 3 mL via INTRAVENOUS

## 2022-10-30 MED ORDER — MORPHINE SULFATE (PF) 2 MG/ML IV SOLN: 2.0000 mg | INTRAVENOUS | Status: DC | PRN

## 2022-10-30 MED ORDER — ENOXAPARIN SODIUM 40 MG/0.4ML IJ SOSY: 40.0000 mg | PREFILLED_SYRINGE | INTRAMUSCULAR | Status: DC

## 2022-10-30 MED ORDER — OXYCODONE HCL ER 10 MG PO T12A
10.0000 mg | EXTENDED_RELEASE_TABLET | Freq: Two times a day (BID) | ORAL | Status: AC
  Administered 2022-10-30 – 2022-11-02 (×6): 10 mg via ORAL
  Filled 2022-10-30 (×6): qty 1

## 2022-10-30 MED ORDER — POLYETHYLENE GLYCOL 3350 17 G PO PACK
17.0000 g | PACK | Freq: Two times a day (BID) | ORAL | Status: DC
  Administered 2022-10-30 – 2022-11-04 (×7): 17 g via ORAL
  Filled 2022-10-30 (×7): qty 1

## 2022-10-30 MED ORDER — ACETAMINOPHEN 325 MG PO TABS
650.0000 mg | ORAL_TABLET | ORAL | Status: DC
  Administered 2022-10-30 – 2022-10-31 (×3): 650 mg via ORAL
  Filled 2022-10-30 (×3): qty 2

## 2022-10-30 NOTE — Progress Notes (Signed)
Imaging and labs reviewed Patient with a displaced right femoral neck fracture Plan for OR tomorrow for hemiarthroplasty, likely early afternoon NPO after midnight Hold Anticoagulation for OR Full Consult to follow  Reinaldo Berber MD

## 2022-10-30 NOTE — Assessment & Plan Note (Addendum)
working diagnosis based on the fragility fracture suffered by the patient today.  We will check phos level and vitamin D and TSH.

## 2022-10-30 NOTE — ED Provider Notes (Signed)
[facility] Provider Note    Event Date/Time   First MD Initiated Contact with Patient 10/30/22 1908     (approximate)   History   Fall and Hip Pain   HPI  Matthew Zamora is a 82 y.o. male here with hip pain.  The patient states that earlier today, he fell onto his right hip.  He states that he just lost his footing.  He was not using his walker at the time.  Reports immediate, severe, right hip pain that has been progressively worsening.  He states that he has been unable to put weight on it.  No history of previous injury to the area.  He is not believe he hit his head.  He is on blood thinners.  He was well prior to the fall and denies any recent illnesses.     Physical Exam   Triage Vital Signs: ED Triage Vitals  Enc Vitals Group     BP 10/30/22 1911 (!) 153/99     Pulse Rate 10/30/22 1911 99     Resp 10/30/22 1911 (!) 22     Temp 10/30/22 1911 98.3 F (36.8 C)     Temp Source 10/30/22 1911 Oral     SpO2 10/30/22 1911 100 %     Weight 10/30/22 1912 132 lb (59.9 kg)     Height 10/30/22 1912 5\' 8"  (1.727 m)     Head Circumference --      Peak Flow --      Pain Score 10/30/22 1911 8     Pain Loc --      Pain Edu? --      Excl. in GC? --     Most recent vital signs: Vitals:   10/30/22 2209 10/30/22 2232  BP: (!) 148/90 (!) 159/101  Pulse: 98   Resp: 19 20  Temp: 98 F (36.7 C) (!) 97.5 F (36.4 C)  SpO2: 99% 97%     General: Awake, no distress.  CV:  Good peripheral perfusion.  Regular rate and rhythm. Resp:  Normal work of breathing.  Lungs clear. Abd:  No distention.  No tenderness. Other:  Significant tenderness to palpation over the right lateral hip.  No bruising.  Superficial abrasion noted to the right hip as well as right knee.  No major tenderness over the knee.   ED Results / Procedures / Treatments   Labs (all labs ordered are listed, but only abnormal results are displayed) Labs Reviewed  CBC WITH  DIFFERENTIAL/PLATELET - Abnormal; Notable for the following components:      Result Value   RBC 3.42 (*)    Hemoglobin 10.1 (*)    HCT 32.1 (*)    All other components within normal limits  BASIC METABOLIC PANEL - Abnormal; Notable for the following components:   Sodium 134 (*)    Glucose, Bld 105 (*)    Creatinine, Ser 1.49 (*)    GFR, Estimated 47 (*)    All other components within normal limits  PHOSPHORUS  VITAMIN D 25 HYDROXY (VIT D DEFICIENCY, FRACTURES)  THYROID PANEL WITH TSH  PROTIME-INR  APTT  BASIC METABOLIC PANEL  CBC  HEPATIC FUNCTION PANEL     EKG Atrial fibrillation, ventricular rate 96.  QRS 111, QTc 438.  No acute ST elevation or depression or acute evidence of acute ischemia or infarct.   RADIOLOGY CT hip right: Mildly angulated right transcervical fracture Chest x-ray: Clear DG hip: Foreshortening of the right femoral  neck concerning for femoral neck fracture DG knee right: Negative CT head negative CT C-spine: Negative   I also independently reviewed and agree with radiologist interpretations.   PROCEDURES:  Critical Care performed: No  .1-3 Lead EKG Interpretation  Performed by: Shaune Pollack, MD Authorized by: Shaune Pollack, MD     Interpretation: normal     ECG rate:  90-100   ECG rate assessment: normal     Rhythm: sinus rhythm     Ectopy: none     Conduction: normal   Comments:     Indication: fall     MEDICATIONS ORDERED IN ED: Medications  polyethylene glycol (MIRALAX / GLYCOLAX) packet 17 g (17 g Oral Given 10/30/22 2238)  acetaminophen (TYLENOL) tablet 650 mg (650 mg Oral Given 10/30/22 2238)  oxyCODONE (OXYCONTIN) 12 hr tablet 10 mg (10 mg Oral Given 10/30/22 2238)  morphine (PF) 2 MG/ML injection 2 mg (has no administration in time range)  oxyCODONE (Oxy IR/ROXICODONE) immediate release tablet 5 mg (has no administration in time range)  lactated ringers infusion ( Intravenous New Bag/Given 10/30/22 2238)  enoxaparin  (LOVENOX) injection 40 mg (has no administration in time range)  sodium chloride flush (NS) 0.9 % injection 3 mL (3 mLs Intravenous Given 10/30/22 2241)  morphine (PF) 4 MG/ML injection 4 mg (4 mg Intravenous Given 10/30/22 1937)  ondansetron (ZOFRAN) injection 4 mg (4 mg Intravenous Given 10/30/22 1937)  morphine (PF) 4 MG/ML injection 4 mg (4 mg Intravenous Given 10/30/22 2123)     IMPRESSION / MDM / ASSESSMENT AND PLAN / ED COURSE  I reviewed the triage vital signs and the nursing notes.                              Differential diagnosis includes, but is not limited to, fall, hip fracture, dislocation, contusion  Patient's presentation is most consistent with acute presentation with potential threat to life or bodily function.  The patient is on the cardiac monitor to evaluate for evidence of arrhythmia and/or significant heart rate changes  82 year old male with history of hypertension, hyperlipidemia, A-fib on anticoagulation, here with fall and right hip pain.  Imaging shows right transcervical femoral fracture.  Patient has no distal neurovascular compromise.  Pain is improved with morphine.  No other injuries.  Screening lab work overall unremarkable.  Creatinine is at baseline.  No leukocytosis.  No anemia.  Dr. Audelia Acton with Ortho has been consulted and states he will likely take patient to the OR tomorrow.  N.p.o. at midnight.  FINAL CLINICAL IMPRESSION(S) / ED DIAGNOSES   Final diagnoses:  Fall, initial encounter     Rx / DC Orders   ED Discharge Orders     None        Note:  This document was prepared using Dragon voice recognition software and may include unintentional dictation errors.   Shaune Pollack, MD 10/30/22 2248

## 2022-10-30 NOTE — H&P (Addendum)
History and Physical    Patient: Matthew Zamora ZOX:[id number] DOB: 1940/11/24 DOA: 10/30/2022 DOS: the patient was seen and examined on 10/30/2022 PCP: Jerl Mina, MD  Patient coming from: Home  Chief Complaint:  Chief Complaint  Patient presents with   Fall   Hip Pain   HPI: Matthew Zamora is a 82 y.o. male with medical history significant of COPD with a chronic 2 L/min of supplemental oxygen use at home.  Patient seems to be at his usual baseline till approximately 3 PM today.  At the time patient had done showering and was in the kitchen area where his laundry is and was trying to get on her shirt.  Unfortunately patient turned in "haste "and stuck his right foot behind his left leg and fell onto the floor.  Patient denies any focal weakness or any vertigo or presyncope or tremors.  Patient had immediate right hip.  Pain with intermittent inability to bear weight.  Fortunately patient was able to call EMS and is brought to [facility] ER.  Patient has had a vitally stable ER course.  Patient receives 4 mg of IV morphine in the ER and reports good response in terms of pain control.  Orthopedic surgery has been contacted by the ER attending and it is anticipated that patient will need fixation done in the morning.  Patient reports apixaban use, however otherwise no bleeding diathesis, reports being able to complete his activities of daily living while using supplementary oxygen as needed, reports no marked tachycardia or leg swelling.  Can even exert to go up stairs occasionally when needed without any chest pain palpitations or presyncope. Review of Systems: As mentioned in the history of present illness. All other systems reviewed and are negative. 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 pylori infection    History of hepatitis    Hyperlipidemia    Hypertension    Liver cyst 05/16/15   PAF (paroxysmal atrial fibrillation) ([name])    Prostate CA [facility])    Past Surgical History:  Procedure Laterality Date   CATARACT EXTRACTION     COLONOSCOPY  10/06/2014, 09/18/2004, 03/14/2009   ESOPHAGOGASTRODUODENOSCOPY  10/06/2014, 03/18/2001, 03/14/2009   ESOPHAGOGASTRODUODENOSCOPY (EGD) WITH PROPOFOL N/A 05/07/2018   Procedure: ESOPHAGOGASTRODUODENOSCOPY (EGD) WITH PROPOFOL;  Surgeon: Toledo, Boykin Nearing, MD;  Location: [facility] ENDOSCOPY;  Service: Gastroenterology;  Laterality: N/A;   ESOPHAGOGASTRODUODENOSCOPY (EGD) WITH PROPOFOL N/A 04/22/2022   Procedure: ESOPHAGOGASTRODUODENOSCOPY (EGD) WITH PROPOFOL;  Surgeon: Toney Reil, MD;  Location: [facility] ENDOSCOPY;  Service: Endoscopy;  Laterality: N/A;   FLEXIBLE SIGMOIDOSCOPY  08/26/1990   INSERTION OF ICD     PROSTATE SURGERY     TONSILLECTOMY     Social History:  reports that he quit smoking about 23 years ago. His smoking use included cigarettes. He has a 38.00 pack-year smoking history. He has never used smokeless tobacco. He reports that he does not currently use drugs. He reports that he does not drink alcohol.  Allergies  Allergen Reactions   Nitrofuran Derivatives Other (See Comments)    Transaminitis ** confounded w/Amiodarone, Mexiletine   Spironolactone  Significant transaminitis     Family History  Problem Relation Age of Onset   Heart attack Mother    Heart attack Father     Prior to Admission medications   Medication Sig Start Date End Date Taking? Authorizing Provider  acetaminophen (TYLENOL) 325 MG tablet Take 2 tablets (650 mg total) by mouth every 4 (four) hours as needed for headache or mild pain. 10/29/19   Judithe Modest, NP  albuterol (PROVENTIL) (2.5 MG/3ML)  0.083% nebulizer solution Inhale 3 mLs (2.5 mg total) into the lungs as needed for shortness of breath. 10/29/19   Judithe Modest, NP  albuterol (VENTOLIN HFA) 108 (90 Base) MCG/ACT inhaler Inhale 2 puffs into the lungs every 4 (four) hours as needed. 04/21/21   [provider]  ascorbic acid (VITAMIN C) 500 MG tablet Take 1 tablet by mouth daily.    [provider]  aspirin 81 MG chewable tablet Chew 81 mg by mouth daily at 12 noon. 03/30/21   [provider]  atorvastatin (LIPITOR) 80 MG tablet Take 80 mg by mouth daily. 05/13/21   [provider]  calcium carbonate (TUMS EX) 750 MG chewable tablet Chew 1 tablet by mouth daily.    [provider]  ELIQUIS 2.5 MG TABS tablet Take 2.5 mg by mouth 2 (two) times daily. 12/28/21   [provider]  fluticasone-salmeterol (ADVAIR) 500-50 MCG/ACT AEPB Inhale 1 puff into the lungs in the morning and at bedtime. 04/05/22   [provider]  gabapentin (NEURONTIN) 100 MG capsule Take 100 mg by mouth at bedtime. 12/09/19   [provider]  magnesium oxide (MAG-OX) 400 (240 Mg) MG tablet Take 1 tablet by mouth daily. 02/05/21   [provider]  melatonin 3 MG TABS tablet Take 9 mg by mouth at bedtime.    [provider]  metoCLOPramide (REGLAN) 5 MG tablet Take 1 tablet (5 mg total) by mouth every 8 (eight) hours as needed for refractory nausea / vomiting. 05/13/22   Delfino Lovett, MD  midodrine (PROAMATINE) 5 MG tablet Take 3 tablets (15 mg total) by mouth 3 (three) times daily with meals. 05/13/22   Delfino Lovett, MD  Multiple Vitamin (MULTIVITAMIN WITH MINERALS) TABS tablet Take 1 tablet by mouth daily. 10/29/19   Judithe Modest, NP  Nebulizer MISC 1 each by Does not apply route as needed. 06/16/21   Rolly Salter, MD  Nutritional Supplements (,FEEDING SUPPLEMENT, PROSOURCE PLUS) liquid Take 30 mLs by mouth 3 (three) times daily between meals. 05/13/22   Delfino Lovett, MD   ondansetron (ZOFRAN-ODT) 4 MG disintegrating tablet Take 4 mg by mouth every 8 (eight) hours as needed for refractory nausea / vomiting, vomiting or nausea.    [provider]  pantoprazole (PROTONIX) 40 MG tablet Take 1 tablet (40 mg total) by mouth 2 (two) times daily. 05/13/22   Delfino Lovett, MD  pyridostigmine (MESTINON) 60 MG tablet Take 1 tablet (60 mg total) by mouth every 8 (eight) hours. 05/13/22   Delfino Lovett, MD  sertraline (ZOLOFT) 25 MG tablet Take 25 mg by mouth daily.    [provider]  spironolactone (ALDACTONE) 25 MG tablet Take 25 mg by mouth daily. 05/05/22   [provider]  sucralfate (CARAFATE) 1 GM/10ML suspension Take 10 mLs (1 g total) by mouth 4 (four) times daily -  with meals and at bedtime. 05/13/22   Delfino Lovett, MD  tadalafil (CIALIS) 20 MG tablet Take 20 mg by mouth daily.  09/04/21   [provider]  tamsulosin (FLOMAX) 0.4 MG CAPS capsule Take 0.4 mg by mouth daily. 12/09/19   [provider]    Physical Exam: Vitals:   10/30/22 1911 10/30/22 1912  BP: (!) 153/99   Pulse: 99   Resp: (!) 22   Temp: 98.3 F (36.8 C)   TempSrc: Oral   SpO2: 100%   Weight:  59.9 kg  Height:  5\' 8"  (1.727 m)   General: Patient is alert awake, no distress Respiratory exam: Diffusely diminished breath sounds with vesicular air entry and prolonged expiration but no wheezes no respiratory distress.  Patient on 2 L/min/min of supplementary oxygen Cardiovascular exam S1-S2 normal Abdomen scaphoid nontender Extremities warm without edema. Bilateral hips are Flexed.  Good function in the left lower extremity, however I did not manipulate the right hip.  Distal function seems to be intact.  No clinical fracture in the right leg.  Upper extremity good movement.  Neck movements are free. Data Reviewed:  Labs on Admission:  Results for orders placed or performed during the hospital encounter of 10/30/22 (from the past 24 hour(s))  CBC with  Differential     Status: Abnormal   Collection Time: 10/30/22  7:38 PM  Result Value Ref Range   WBC 8.9 4.0 - 10.5 K/uL   RBC 3.42 (L) 4.22 - 5.81 MIL/uL   Hemoglobin 10.1 (L) 13.0 - 17.0 g/dL   HCT 40.9 (L) 81.1 - 91.4 %   MCV 93.9 80.0 - 100.0 fL   MCH 29.5 26.0 - 34.0 pg   MCHC 31.5 30.0 - 36.0 g/dL   RDW 78.2 95.6 - 21.3 %   Platelets 180 150 - 400 K/uL   nRBC 0.0 0.0 - 0.2 %   Neutrophils Relative % 83 %   Neutro Abs 7.4 1.7 - 7.7 K/uL   Lymphocytes Relative 8 %   Lymphs Abs 0.7 0.7 - 4.0 K/uL   Monocytes Relative 6 %   Monocytes Absolute 0.6 0.1 - 1.0 K/uL   Eosinophils Relative 1 %   Eosinophils Absolute 0.1 0.0 - 0.5 K/uL   Basophils Relative 1 %   Basophils Absolute 0.1 0.0 - 0.1 K/uL   Immature Granulocytes 1 %   Abs Immature Granulocytes 0.04 0.00 - 0.07 K/uL  Basic metabolic panel     Status: Abnormal   Collection Time: 10/30/22  7:38 PM  Result Value Ref Range   Sodium 134 (L) 135 - 145 mmol/L   Potassium 4.7 3.5 - 5.1 mmol/L   Chloride 102 98 - 111 mmol/L   CO2 23 22 - 32 mmol/L   Glucose, Bld 105 (H) 70 - 99 mg/dL   BUN 15 8 - 23 mg/dL   Creatinine, Ser 0.86 (H) 0.61 - 1.24 mg/dL   Calcium 8.9 8.9 - 57.8 mg/dL   GFR, Estimated 47 (L) >60 mL/min   Anion gap 9 5 - 15  Radiological Exams on Admission:  CT Hip Right Wo Contrast  Result Date: 10/30/2022 CLINICAL DATA:  Right femoral fracture EXAM: CT OF THE RIGHT HIP WITHOUT CONTRAST TECHNIQUE: Multidetector CT imaging of the right hip was performed according to the standard protocol. Multiplanar CT image reconstructions were also generated. RADIATION DOSE REDUCTION: This exam was performed according to the departmental dose-optimization program which includes automated exposure control, adjustment of the mA and/or kV according to patient size and/or use of iterative reconstruction technique. COMPARISON:  Right hips and pelvis radiograph dated 10/30/2022 FINDINGS: Bones/Joint/Cartilage Acute transcervical fracture  of the right proximal femur with apex anterior angulation. The right femoroacetabular joint is intact. Ligaments Suboptimally assessed by CT. Muscles and Tendons Grossly intact. Soft tissues Vascular calcifications of the partially imaged right lower extremity arteries. IMPRESSION: Mildly angulated right femoral transcervical fracture. Electronically Signed   By: Agustin Cree M.D.   On: 10/30/2022 20:49   DG Knee 2 Views Right  Result Date: 10/30/2022 CLINICAL DATA:  Right knee pain, fall EXAM: RIGHT KNEE - 1-2 VIEW COMPARISON:  None Available. FINDINGS: Moderate tricompartment degenerative changes with joint space narrowing and spurring. No joint effusion. No acute bony abnormality. Specifically, no fracture, subluxation, or dislocation. IMPRESSION: Moderate degenerative changes.  No acute bony abnormality. Electronically Signed   By: Charlett Nose M.D.   On: 10/30/2022 20:25   DG Hip Unilat W or Wo Pelvis 2-3 Views Right  Result Date: 10/30/2022 CLINICAL DATA:  Fall, right hip pain EXAM: DG HIP (WITH OR WITHOUT PELVIS) 2-3V RIGHT COMPARISON:  None Available. FINDINGS: There is foreshortening of the right femoral neck with cortical irregularity noted laterally concerning for right femoral neck fracture. No subluxation or dislocation. Mild symmetric degenerative changes in the hips with joint space narrowing and spurring. IMPRESSION: Foreshortening of the right femoral neck with cortical irregularity laterally concerning for femoral neck fracture. Electronically Signed   By: Charlett Nose M.D.   On: 10/30/2022 20:24   DG Chest Portable 1 View  Result Date: 10/30/2022 CLINICAL DATA:  Fall, right hip pain EXAM: PORTABLE CHEST 1 VIEW COMPARISON:  09/14/2022 FINDINGS: Left AICD remains in place, unchanged. Heart is normal size. Mediastinal contours within normal limits. No confluent opacities or effusions. No acute bony abnormality. IMPRESSION: No active disease. Electronically Signed   By: Charlett Nose M.D.    On: 10/30/2022 20:22   CT HEAD WO CONTRAST ( )  Result Date: 10/30/2022 CLINICAL DATA:  Trip and fall injury. EXAM: CT HEAD WITHOUT CONTRAST CT CERVICAL SPINE WITHOUT CONTRAST TECHNIQUE: Multidetector CT imaging of the head and cervical spine was performed following the standard protocol without intravenous contrast. Multiplanar CT image reconstructions of the cervical spine were also generated. RADIATION DOSE REDUCTION: This exam was performed according to the departmental dose-optimization program which includes automated exposure control, adjustment of the mA and/or kV according to patient size and/or use of iterative reconstruction technique. COMPARISON:  Head CT without contrast 04/17/2022, cervical spine CT 03/27/2022. FINDINGS: CT HEAD FINDINGS Brain: There is mild atrophy, small-vessel disease and atrophic ventriculomegaly. No new asymmetry is seen concerning for an acute infarct, hemorrhage or mass. There is no midline shift. The basal cisterns are clear. Vascular: There are patchy calcifications of the carotid siphons but no hyperdense central vessels. Skull: No fracture or focal lesions.  Mild calvarial osteopenia. Sinuses/Orbits: No acute findings. Clear visualized sinuses and mastoid air cells. Negative orbits apart from old lens replacements. Other: None. CT CERVICAL SPINE FINDINGS Alignment: Unchanged. There is a trace 2 mm C3-4 retrolisthesis, similar minimal C4-5 anterolisthesis and trace C5-6 and C6-7 retrolisthesis all believed degenerative and all unchanged. No traumatic listhesis is seen. There is bone-on-bone anterior atlantodental joint space loss with osteophytes, and degenerative cystic changes in the odontoid process. Skull base and vertebrae: No acute fracture is evident. Bone mineralization is osteopenic. No primary bone lesion or focal pathologic process is seen. Soft tissues and spinal canal: No prevertebral fluid or swelling. No visible canal hematoma. There are calcifications in  both proximal cervical ICAs, heaviest on the left where there is probably a flow-limiting origin  stenosis. No laryngeal or thyroid mass. Disc levels: There is moderate disc space loss again at C3-4, C5-6 and C6-7, mild disc narrowing at C3-4, normal disc heights at C2-3 and C7-T1. There are small bidirectional endplate spurs from C3-4 through C6-7, causing partial effacement of the ventral CSF without spondylotic cord compression. Facet joint spurring on the left-greater-than-right is seen at most levels with bilateral uncinate spurring. Acquired foraminal stenosis is mild on the right at C2-3, severe on the left and moderate on the right at C3-4, bilaterally mild-to-moderate C6-7, and not seen at the remaining levels. Upper chest: Pacemaker wiring is partially visible entering from the left. Otherwise negative. Other: None. IMPRESSION: 1. No acute intracranial CT findings or depressed skull fractures. 2. Osteopenia and degenerative change without evidence of cervical fractures or traumatic listhesis. 3. 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 Follow-up as indicated. Electronically Signed   By: Almira Bar M.D.   On: 10/30/2022 20:15   CT Cervical Spine Wo Contrast  Result Date: 10/30/2022 CLINICAL DATA:  Trip and fall injury. EXAM: CT HEAD WITHOUT CONTRAST CT CERVICAL SPINE WITHOUT CONTRAST TECHNIQUE: Multidetector CT imaging of the head and cervical spine was performed following the standard protocol without intravenous contrast. Multiplanar CT image reconstructions of the cervical spine were also generated. RADIATION DOSE REDUCTION: This exam was performed according to the departmental dose-optimization program which includes automated exposure control, adjustment of the mA and/or kV according to patient size and/or use of iterative reconstruction technique. COMPARISON:  Head CT without contrast 04/17/2022, cervical spine CT 03/27/2022. FINDINGS: CT  HEAD FINDINGS Brain: There is mild atrophy, small-vessel disease and atrophic ventriculomegaly. No new asymmetry is seen concerning for an acute infarct, hemorrhage or mass. There is no midline shift. The basal cisterns are clear. Vascular: There are patchy calcifications of the carotid siphons but no hyperdense central vessels. Skull: No fracture or focal lesions.  Mild calvarial osteopenia. Sinuses/Orbits: No acute findings. Clear visualized sinuses and mastoid air cells. Negative orbits apart from old lens replacements. Other: None. CT CERVICAL SPINE FINDINGS Alignment: Unchanged. There is a trace 2 mm C3-4 retrolisthesis, similar minimal C4-5 anterolisthesis and trace C5-6 and C6-7 retrolisthesis all believed degenerative and all unchanged. No traumatic listhesis is seen. There is bone-on-bone anterior atlantodental joint space loss with osteophytes, and degenerative cystic changes in the odontoid process. Skull base and vertebrae: No acute fracture is evident. Bone mineralization is osteopenic. No primary bone lesion or focal pathologic process is seen. Soft tissues and spinal canal: No prevertebral fluid or swelling. No visible canal hematoma. There are calcifications in both proximal cervical ICAs, heaviest on the left where there is probably a flow-limiting origin stenosis. No laryngeal or thyroid mass. Disc levels: There is moderate disc space loss again at C3-4, C5-6 and C6-7, mild disc narrowing at C3-4, normal disc heights at C2-3 and C7-T1. There are small bidirectional endplate spurs from C3-4 through C6-7, causing partial effacement of the ventral CSF without spondylotic cord compression. Facet joint spurring on the left-greater-than-right is seen at most levels with bilateral uncinate spurring. Acquired foraminal stenosis is mild on the right at C2-3, severe on the left and moderate on the right at C3-4, bilaterally mild-to-moderate C6-7, and not seen at the remaining levels. Upper chest: Pacemaker  wiring is partially visible entering from the left. Otherwise negative. Other: None. IMPRESSION: 1. No acute intracranial CT findings or depressed skull fractures. 2. Osteopenia and degenerative change without evidence of cervical fractures or traumatic  listhesis. 3. 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 Follow-up as indicated. Electronically Signed   By: Almira Bar M.D.   On: 10/30/2022 20:15    EKG: Independently reviewed. AFib   Assessment and Plan: * Femoral fracture ([name]) Mechanical fall. Mildly angulated right cervical femoral fracture.  Surgery has already been consulted by ER attending, await their formal evaluation.  Patient is already on apixaban which will be held.  Will start Lovenox tomorrow evening.  Anticipate or tomorrow morning.  Patient reports good response to IV morphine 4 mg .  At this time I will order the patient standing acetaminophen and standing OxyContin 10 mg twice a day along with 5 mg of oxycodone every 4 hours as needed for moderate to severe pain as well as 2 mg of IV morphine 1 every 1 hours as needed for breakthrough pain.  Patient is pending LFTs and AM cortisol at this time.  Assuming the LFTs & coritsol are normal,  and the surgical team is aware of patient apixaban use,  patient is acceptable risk for anticipated right femoral procedure. Patient is at risk of perioperative hypotension given his history of chornic hypotension.   Osteoporosis working diagnosis based on the fragility fracture suffered by the patient today.  We will check phos level and vitamin D and TSH.  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 Continue with midodrine and pyridostigmine. .  Check cortisol level in the morning. Current blood pressure is good.      Advance Care Planning:   Code Status: Full Code   Consults: surgery orthopedics  Family Communication: per patient.  Severity of Illness: The appropriate patient  status for this patient is INPATIENT. Inpatient status is judged to be reasonable and necessary in order to provide the required intensity of service to ensure the patient's safety. The patient's presenting symptoms, physical exam findings, and initial radiographic and laboratory data in the context of their chronic comorbidities is felt to place them at high risk for further clinical deterioration. Furthermore, it is not anticipated that the patient will be medically stable for discharge from the hospital within 2 midnights of admission.   * I certify that at the point of admission it is my clinical judgment that the patient will require inpatient hospital care spanning beyond 2 midnights from the point of admission due to high intensity of service, high risk for further deterioration and high frequency of surveillance required.*  Author: Nolberto Hanlon, MD 10/30/2022 9:49 PM  For on call review [email or web address].

## 2022-10-30 NOTE — Assessment & Plan Note (Addendum)
This is a chronic diagnosis/issue. Continue with midodrine and pyridostigmine. .  Check cortisol level in the morning. Current blood pressure is good.

## 2022-10-30 NOTE — Assessment & Plan Note (Addendum)
Mechanical fall. Mildly angulated right cervical femoral fracture.  Surgery has already been consulted by ER attending, await their formal evaluation.  Patient is already on apixaban which will be held.  Will start Lovenox tomorrow evening.  Anticipate or tomorrow morning.  Patient reports good response to IV morphine 4 mg .  At this time I will order the patient standing acetaminophen and standing OxyContin 10 mg twice a day along with 5 mg of oxycodone every 4 hours as needed for moderate to severe pain as well as 2 mg of IV morphine 1 every 1 hours as needed for breakthrough pain.  Patient is pending LFTs and AM cortisol at this time.  Assuming the LFTs & coritsol are normal,  and the surgical team is aware of patient apixaban use,  patient is acceptable risk for anticipated right femoral procedure. Patient is at risk of perioperative hypotension given his history of chornic hypotension.

## 2022-10-30 NOTE — ED Triage Notes (Signed)
Arrived via EMS from home. States he tripped and fell and now reporting right hip pain. Denies LOC, denies hitting head. Takes eliquis. AOX4. Resp even, unlabored on 2L per [place]. EMS report no shortening or rotation. Speaking in full clear sentences.

## 2022-10-31 ENCOUNTER — Inpatient Hospital Stay: Payer: Medicare Other | Admitting: Anesthesiology

## 2022-10-31 ENCOUNTER — Inpatient Hospital Stay: Payer: Medicare Other

## 2022-10-31 ENCOUNTER — Encounter
Admission: EM | Disposition: A | Discharge status: Skilled Nursing Facility | Payer: Self-pay | Source: Home / Self Care | Attending: Internal Medicine

## 2022-10-31 ENCOUNTER — Other Ambulatory Visit: Payer: Self-pay

## 2022-10-31 DIAGNOSIS — S72001A Fracture of unspecified part of neck of right femur, initial encounter for closed fracture: Secondary | ICD-10-CM

## 2022-10-31 HISTORY — PX: HIP ARTHROPLASTY: SHX981

## 2022-10-31 LAB — BASIC METABOLIC PANEL
Anion gap: 7 (ref 5–15)
Anion gap: 9 (ref 5–15)
BUN: 18 mg/dL (ref 8–23)
BUN: 19 mg/dL (ref 8–23)
CO2: 27 mmol/L (ref 22–32)
CO2: 24 mmol/L (ref 22–32)
Calcium: 9.1 mg/dL (ref 8.9–10.3)
Calcium: 8.9 mg/dL (ref 8.9–10.3)
Chloride: 101 mmol/L (ref 98–111)
Chloride: 100 mmol/L (ref 98–111)
Creatinine, Ser: 1.62 mg/dL — ABNORMAL HIGH (ref 0.61–1.24)
Creatinine, Ser: 1.62 mg/dL — ABNORMAL HIGH (ref 0.61–1.24)
GFR, Estimated: 43 mL/min — ABNORMAL LOW (ref >60)
GFR, Estimated: 43 mL/min — ABNORMAL LOW (ref >60)
Glucose, Bld: 98 mg/dL (ref 70–99)
Glucose, Bld: 83 mg/dL (ref 70–99)
Potassium: 5.2 mmol/L — ABNORMAL HIGH (ref 3.5–5.1)
Potassium: 4.8 mmol/L (ref 3.5–5.1)
Sodium: 135 mmol/L (ref 135–145)
Sodium: 133 mmol/L — ABNORMAL LOW (ref 135–145)

## 2022-10-31 LAB — HEPATIC FUNCTION PANEL
ALT: 19 U/L (ref 0–44)
AST: 24 U/L (ref 15–41)
Albumin: 3.6 g/dL (ref 3.5–5.0)
Alkaline Phosphatase: 71 U/L (ref 38–126)
Bilirubin, Direct: 0.1 mg/dL (ref 0.0–0.2)
Indirect Bilirubin: 0.7 mg/dL (ref 0.3–0.9)
Total Bilirubin: 0.8 mg/dL (ref 0.3–1.2)
Total Protein: 6.5 g/dL (ref 6.5–8.1)

## 2022-10-31 LAB — CBC
HCT: 32.3 % — ABNORMAL LOW (ref 39.0–52.0)
HCT: 28.3 % — ABNORMAL LOW (ref 39.0–52.0)
Hemoglobin: 9.9 g/dL — ABNORMAL LOW (ref 13.0–17.0)
Hemoglobin: 8.8 g/dL — ABNORMAL LOW (ref 13.0–17.0)
MCH: 28.9 pg (ref 26.0–34.0)
MCH: 29.1 pg (ref 26.0–34.0)
MCHC: 30.7 g/dL (ref 30.0–36.0)
MCHC: 31.1 g/dL (ref 30.0–36.0)
MCV: 94.2 fL (ref 80.0–100.0)
MCV: 93.7 fL (ref 80.0–100.0)
Platelets: 179 10*3/uL (ref 150–400)
Platelets: 159 10*3/uL (ref 150–400)
RBC: 3.43 MIL/uL — ABNORMAL LOW (ref 4.22–5.81)
RBC: 3.02 MIL/uL — ABNORMAL LOW (ref 4.22–5.81)
RDW: 14.2 % (ref 11.5–15.5)
RDW: 14.3 % (ref 11.5–15.5)
WBC: 8.1 10*3/uL (ref 4.0–10.5)
WBC: 7.3 10*3/uL (ref 4.0–10.5)
nRBC: 0 % (ref 0.0–0.2)
nRBC: 0 % (ref 0.0–0.2)

## 2022-10-31 LAB — PROTIME-INR
INR: 1.2 (ref 0.8–1.2)
Prothrombin Time: 15 seconds (ref 11.4–15.2)

## 2022-10-31 LAB — TYPE AND SCREEN
ABO/RH(D): AB NEG
Unit division: 0

## 2022-10-31 LAB — APTT: aPTT: 34 seconds (ref 24–36)

## 2022-10-31 LAB — BPAM RBC: Blood Product Expiration Date: 202405242359

## 2022-10-31 LAB — PREPARE RBC (CROSSMATCH)

## 2022-10-31 LAB — CORTISOL-AM, BLOOD: Cortisol - AM: 21.3 ug/dL (ref 6.7–22.6)

## 2022-10-31 LAB — PHOSPHORUS: Phosphorus: 4.1 mg/dL (ref 2.5–4.6)

## 2022-10-31 SURGERY — HEMIARTHROPLASTY, HIP, DIRECT ANTERIOR APPROACH, FOR FRACTURE
Anesthesia: General | Site: Hip | Laterality: Right

## 2022-10-31 MED ORDER — TAMSULOSIN HCL 0.4 MG PO CAPS
0.4000 mg | ORAL_CAPSULE | Freq: Every day | ORAL | Status: DC
  Administered 2022-10-31 – 2022-11-07 (×8): 0.4 mg via ORAL
  Filled 2022-10-31 (×8): qty 1

## 2022-10-31 MED ORDER — TRANEXAMIC ACID-NACL 1000-0.7 MG/100ML-% IV SOLN
INTRAVENOUS | Status: AC
  Filled 2022-10-31: qty 100

## 2022-10-31 MED ORDER — EPINEPHRINE PF 1 MG/ML IJ SOLN
INTRAMUSCULAR | Status: AC
  Filled 2022-10-31: qty 1

## 2022-10-31 MED ORDER — SODIUM CHLORIDE 0.9 % IV SOLN: INTRAVENOUS | Status: DC

## 2022-10-31 MED ORDER — TRANEXAMIC ACID-NACL 1000-0.7 MG/100ML-% IV SOLN
1000.0000 mg | INTRAVENOUS | Status: AC
  Administered 2022-10-31: 1000 mg via INTRAVENOUS

## 2022-10-31 MED ORDER — DEXAMETHASONE SODIUM PHOSPHATE 10 MG/ML IJ SOLN
INTRAMUSCULAR | Status: DC | PRN
  Administered 2022-10-31: 8 mg via INTRAVENOUS

## 2022-10-31 MED ORDER — NOREPINEPHRINE 4 MG/250ML-% IV SOLN
INTRAVENOUS | Status: AC
  Filled 2022-10-31: qty 250

## 2022-10-31 MED ORDER — CHLORHEXIDINE GLUCONATE 0.12 % MT SOLN
15.0000 mL | Freq: Once | OROMUCOSAL | Status: AC
  Administered 2022-10-31: 15 mL via OROMUCOSAL

## 2022-10-31 MED ORDER — PANTOPRAZOLE SODIUM 40 MG PO TBEC
40.0000 mg | DELAYED_RELEASE_TABLET | Freq: Every day | ORAL | Status: DC
  Administered 2022-10-31 – 2022-11-07 (×8): 40 mg via ORAL
  Filled 2022-10-31 (×8): qty 1

## 2022-10-31 MED ORDER — ALBUTEROL SULFATE (2.5 MG/3ML) 0.083% IN NEBU
2.5000 mg | INHALATION_SOLUTION | RESPIRATORY_TRACT | Status: DC | PRN
  Administered 2022-11-04: 2.5 mg via RESPIRATORY_TRACT
  Filled 2022-10-31: qty 3

## 2022-10-31 MED ORDER — OXYCODONE HCL 5 MG/5ML PO SOLN: 5.0000 mg | Freq: Once | ORAL | Status: DC | PRN

## 2022-10-31 MED ORDER — PROPOFOL 10 MG/ML IV BOLUS
INTRAVENOUS | Status: AC
  Filled 2022-10-31: qty 20

## 2022-10-31 MED ORDER — CEFAZOLIN SODIUM-DEXTROSE 2-4 GM/100ML-% IV SOLN
2.0000 g | Freq: Four times a day (QID) | INTRAVENOUS | Status: AC
  Administered 2022-10-31 – 2022-11-01 (×2): 2 g via INTRAVENOUS
  Filled 2022-10-31 (×2): qty 100

## 2022-10-31 MED ORDER — ALBUMIN HUMAN 5 % IV SOLN: INTRAVENOUS | Status: DC | PRN

## 2022-10-31 MED ORDER — DOCUSATE SODIUM 100 MG PO CAPS
100.0000 mg | ORAL_CAPSULE | Freq: Two times a day (BID) | ORAL | Status: DC
  Administered 2022-10-31 – 2022-11-04 (×6): 100 mg via ORAL
  Filled 2022-10-31 (×8): qty 1

## 2022-10-31 MED ORDER — MENTHOL 3 MG MT LOZG: 1.0000 | LOZENGE | OROMUCOSAL | Status: DC | PRN

## 2022-10-31 MED ORDER — IPRATROPIUM-ALBUTEROL 0.5-2.5 (3) MG/3ML IN SOLN
3.0000 mL | Freq: Once | RESPIRATORY_TRACT | Status: AC
  Administered 2022-10-31: 3 mL via RESPIRATORY_TRACT

## 2022-10-31 MED ORDER — VASOPRESSIN 20 UNIT/ML IV SOLN
INTRAVENOUS | Status: DC | PRN
  Administered 2022-10-31 (×2): .5 [IU] via INTRAVENOUS
  Administered 2022-10-31: 1 [IU] via INTRAVENOUS

## 2022-10-31 MED ORDER — BUPIVACAINE HCL (PF) 0.25 % IJ SOLN
INTRAMUSCULAR | Status: AC
  Filled 2022-10-31: qty 30

## 2022-10-31 MED ORDER — ATORVASTATIN CALCIUM 80 MG PO TABS
80.0000 mg | ORAL_TABLET | Freq: Every day | ORAL | Status: DC
  Administered 2022-10-31 – 2022-11-07 (×8): 80 mg via ORAL
  Filled 2022-10-31: qty 1
  Filled 2022-10-31 (×6): qty 4
  Filled 2022-10-31: qty 1

## 2022-10-31 MED ORDER — FENTANYL CITRATE (PF) 100 MCG/2ML IJ SOLN
INTRAMUSCULAR | Status: AC
  Filled 2022-10-31: qty 2

## 2022-10-31 MED ORDER — EPHEDRINE 5 MG/ML INJ
INTRAVENOUS | Status: AC
  Filled 2022-10-31: qty 5

## 2022-10-31 MED ORDER — DEXAMETHASONE SODIUM PHOSPHATE 10 MG/ML IJ SOLN
INTRAMUSCULAR | Status: AC
  Filled 2022-10-31: qty 1

## 2022-10-31 MED ORDER — ESMOLOL HCL 100 MG/10ML IV SOLN
INTRAVENOUS | Status: AC
  Filled 2022-10-31: qty 10

## 2022-10-31 MED ORDER — ALBUMIN HUMAN 5 % IV SOLN
INTRAVENOUS | Status: AC
  Filled 2022-10-31: qty 250

## 2022-10-31 MED ORDER — MOMETASONE FURO-FORMOTEROL FUM 200-5 MCG/ACT IN AERO
2.0000 | INHALATION_SPRAY | Freq: Two times a day (BID) | RESPIRATORY_TRACT | Status: DC
  Administered 2022-10-31 – 2022-11-07 (×15): 2 via RESPIRATORY_TRACT
  Filled 2022-10-31: qty 8.8

## 2022-10-31 MED ORDER — ONDANSETRON HCL 4 MG/2ML IJ SOLN
4.0000 mg | Freq: Four times a day (QID) | INTRAMUSCULAR | Status: DC | PRN
  Administered 2022-11-01 – 2022-11-02 (×2): 4 mg via INTRAVENOUS
  Filled 2022-10-31 (×2): qty 2

## 2022-10-31 MED ORDER — ONDANSETRON HCL 4 MG/2ML IJ SOLN
INTRAMUSCULAR | Status: AC
  Filled 2022-10-31: qty 2

## 2022-10-31 MED ORDER — LACTATED RINGERS IV SOLN: INTRAVENOUS | Status: DC

## 2022-10-31 MED ORDER — ACETAMINOPHEN 500 MG PO TABS
500.0000 mg | ORAL_TABLET | Freq: Four times a day (QID) | ORAL | Status: AC
  Administered 2022-10-31 – 2022-11-01 (×3): 500 mg via ORAL
  Filled 2022-10-31 (×3): qty 1

## 2022-10-31 MED ORDER — SUCRALFATE 1 GM/10ML PO SUSP
1.0000 g | Freq: Three times a day (TID) | ORAL | Status: DC
  Administered 2022-10-31 – 2022-11-07 (×26): 1 g via ORAL
  Filled 2022-10-31 (×29): qty 10

## 2022-10-31 MED ORDER — FENTANYL CITRATE (PF) 100 MCG/2ML IJ SOLN
INTRAMUSCULAR | Status: DC | PRN
  Administered 2022-10-31: 50 ug via INTRAVENOUS

## 2022-10-31 MED ORDER — PYRIDOSTIGMINE BROMIDE 60 MG PO TABS
60.0000 mg | ORAL_TABLET | Freq: Three times a day (TID) | ORAL | Status: DC
  Administered 2022-10-31 – 2022-11-07 (×22): 60 mg via ORAL
  Filled 2022-10-31 (×26): qty 1

## 2022-10-31 MED ORDER — CALCIUM CHLORIDE 10 % IV SOLN
INTRAVENOUS | Status: AC
  Filled 2022-10-31: qty 10

## 2022-10-31 MED ORDER — ESMOLOL HCL 100 MG/10ML IV SOLN
INTRAVENOUS | Status: DC | PRN
  Administered 2022-10-31: 50 mg via INTRAVENOUS

## 2022-10-31 MED ORDER — HYDROCODONE-ACETAMINOPHEN 7.5-325 MG PO TABS
1.0000 | ORAL_TABLET | ORAL | Status: DC | PRN
  Administered 2022-10-31 – 2022-11-01 (×3): 1 via ORAL
  Administered 2022-11-01: 2 via ORAL
  Filled 2022-10-31 (×2): qty 1
  Filled 2022-10-31 (×2): qty 2

## 2022-10-31 MED ORDER — ONDANSETRON HCL 4 MG/2ML IJ SOLN: 4.0000 mg | Freq: Once | INTRAMUSCULAR | Status: DC | PRN

## 2022-10-31 MED ORDER — ORAL CARE MOUTH RINSE: 15.0000 mL | Freq: Once | OROMUCOSAL | Status: AC

## 2022-10-31 MED ORDER — PROPOFOL 10 MG/ML IV BOLUS
INTRAVENOUS | Status: DC | PRN
  Administered 2022-10-31: 50 mg via INTRAVENOUS

## 2022-10-31 MED ORDER — PHENOL 1.4 % MT LIQD: 1.0000 | OROMUCOSAL | Status: DC | PRN

## 2022-10-31 MED ORDER — SODIUM CHLORIDE 0.9 % IR SOLN
Status: DC | PRN
  Administered 2022-10-31: 100 mL
  Administered 2022-10-31: 3000 mL

## 2022-10-31 MED ORDER — LIDOCAINE HCL (PF) 2 % IJ SOLN
INTRAMUSCULAR | Status: AC
  Filled 2022-10-31: qty 5

## 2022-10-31 MED ORDER — PANTOPRAZOLE SODIUM 40 MG PO TBEC
40.0000 mg | DELAYED_RELEASE_TABLET | Freq: Two times a day (BID) | ORAL | Status: DC
  Administered 2022-10-31: 40 mg via ORAL
  Filled 2022-10-31: qty 1

## 2022-10-31 MED ORDER — BUPIVACAINE LIPOSOME 1.3 % IJ SUSP
INTRAMUSCULAR | Status: AC
  Filled 2022-10-31: qty 20

## 2022-10-31 MED ORDER — ROCURONIUM BROMIDE 10 MG/ML (PF) SYRINGE
PREFILLED_SYRINGE | INTRAVENOUS | Status: AC
  Filled 2022-10-31: qty 10

## 2022-10-31 MED ORDER — ASPIRIN 81 MG PO CHEW
81.0000 mg | CHEWABLE_TABLET | Freq: Every day | ORAL | Status: DC
  Administered 2022-11-01 – 2022-11-05 (×5): 81 mg via ORAL
  Filled 2022-10-31 (×5): qty 1

## 2022-10-31 MED ORDER — LIDOCAINE HCL (CARDIAC) PF 100 MG/5ML IV SOSY
PREFILLED_SYRINGE | INTRAVENOUS | Status: DC | PRN
  Administered 2022-10-31: 60 mg via INTRAVENOUS

## 2022-10-31 MED ORDER — IPRATROPIUM-ALBUTEROL 0.5-2.5 (3) MG/3ML IN SOLN
RESPIRATORY_TRACT | Status: AC
  Filled 2022-10-31: qty 3

## 2022-10-31 MED ORDER — EPHEDRINE SULFATE (PRESSORS) 50 MG/ML IJ SOLN
INTRAMUSCULAR | Status: DC | PRN
  Administered 2022-10-31: 5 mg via INTRAVENOUS

## 2022-10-31 MED ORDER — ONDANSETRON HCL 4 MG PO TABS
4.0000 mg | ORAL_TABLET | Freq: Four times a day (QID) | ORAL | Status: DC | PRN
  Administered 2022-11-06 – 2022-11-07 (×2): 4 mg via ORAL
  Filled 2022-10-31 (×2): qty 1

## 2022-10-31 MED ORDER — NOREPINEPHRINE BITARTRATE 1 MG/ML IV SOLN
INTRAVENOUS | Status: DC | PRN
  Administered 2022-10-31: 8 mL via INTRAVENOUS

## 2022-10-31 MED ORDER — SODIUM ZIRCONIUM CYCLOSILICATE 5 G PO PACK
5.0000 g | PACK | Freq: Once | ORAL | Status: AC
  Administered 2022-10-31: 5 g via ORAL
  Filled 2022-10-31: qty 1

## 2022-10-31 MED ORDER — METOCLOPRAMIDE HCL 5 MG/ML IJ SOLN
5.0000 mg | Freq: Three times a day (TID) | INTRAMUSCULAR | Status: DC | PRN
  Administered 2022-11-07: 10 mg via INTRAVENOUS
  Filled 2022-10-31: qty 2

## 2022-10-31 MED ORDER — SERTRALINE HCL 50 MG PO TABS
25.0000 mg | ORAL_TABLET | Freq: Every day | ORAL | Status: DC
  Administered 2022-10-31 – 2022-11-07 (×8): 25 mg via ORAL
  Filled 2022-10-31 (×5): qty 1
  Filled 2022-10-31: qty 0.5
  Filled 2022-10-31 (×2): qty 1

## 2022-10-31 MED ORDER — ACETAMINOPHEN 10 MG/ML IV SOLN: 1000.0000 mg | Freq: Once | INTRAVENOUS | Status: DC | PRN

## 2022-10-31 MED ORDER — ROCURONIUM BROMIDE 100 MG/10ML IV SOLN
INTRAVENOUS | Status: DC | PRN
  Administered 2022-10-31: 30 mg via INTRAVENOUS
  Administered 2022-10-31: 40 mg via INTRAVENOUS

## 2022-10-31 MED ORDER — SODIUM CHLORIDE 0.9 % IV SOLN: 10.0000 mL/h | Freq: Once | INTRAVENOUS | Status: DC

## 2022-10-31 MED ORDER — MIDODRINE HCL 5 MG PO TABS
15.0000 mg | ORAL_TABLET | Freq: Three times a day (TID) | ORAL | Status: DC
  Administered 2022-10-31 – 2022-11-07 (×19): 15 mg via ORAL
  Filled 2022-10-31 (×21): qty 3

## 2022-10-31 MED ORDER — APIXABAN 2.5 MG PO TABS
2.5000 mg | ORAL_TABLET | Freq: Two times a day (BID) | ORAL | Status: DC
  Administered 2022-11-01 – 2022-11-07 (×12): 2.5 mg via ORAL
  Filled 2022-10-31 (×12): qty 1

## 2022-10-31 MED ORDER — ALBUTEROL SULFATE HFA 108 (90 BASE) MCG/ACT IN AERS
INHALATION_SPRAY | RESPIRATORY_TRACT | Status: DC | PRN
  Administered 2022-10-31: 4 via RESPIRATORY_TRACT

## 2022-10-31 MED ORDER — CALCIUM CHLORIDE 10 % IV SOLN
INTRAVENOUS | Status: DC | PRN
  Administered 2022-10-31: 500 mg via INTRAVENOUS

## 2022-10-31 MED ORDER — MORPHINE SULFATE (PF) 2 MG/ML IV SOLN
0.5000 mg | INTRAVENOUS | Status: DC | PRN
  Administered 2022-11-01 – 2022-11-02 (×2): 1 mg via INTRAVENOUS
  Filled 2022-10-31 (×2): qty 1

## 2022-10-31 MED ORDER — NOREPINEPHRINE 4 MG/250ML-% IV SOLN
INTRAVENOUS | Status: DC | PRN
  Administered 2022-10-31: 4 ug/min via INTRAVENOUS

## 2022-10-31 MED ORDER — SODIUM CHLORIDE (PF) 0.9 % IJ SOLN
INTRAMUSCULAR | Status: DC | PRN
  Administered 2022-10-31: 50 mL via INTRAMUSCULAR

## 2022-10-31 MED ORDER — METOCLOPRAMIDE HCL 5 MG PO TABS: 5.0000 mg | ORAL_TABLET | Freq: Three times a day (TID) | ORAL | Status: DC | PRN

## 2022-10-31 MED ORDER — CALCIUM CARBONATE ANTACID 500 MG PO CHEW
750.0000 mg | CHEWABLE_TABLET | Freq: Every day | ORAL | Status: DC
  Administered 2022-11-01 – 2022-11-07 (×7): 750 mg via ORAL
  Filled 2022-10-31 (×9): qty 4

## 2022-10-31 MED ORDER — SURGIPHOR WOUND IRRIGATION SYSTEM - OPTIME: TOPICAL | Status: DC | PRN

## 2022-10-31 MED ORDER — KETAMINE HCL 10 MG/ML IJ SOLN
INTRAMUSCULAR | Status: DC | PRN
  Administered 2022-10-31: 20 mg via INTRAVENOUS

## 2022-10-31 MED ORDER — SODIUM CHLORIDE FLUSH 0.9 % IV SOLN
INTRAVENOUS | Status: AC
  Filled 2022-10-31: qty 10

## 2022-10-31 MED ORDER — SUGAMMADEX SODIUM 200 MG/2ML IV SOLN
INTRAVENOUS | Status: DC | PRN
  Administered 2022-10-31: 200 mg via INTRAVENOUS

## 2022-10-31 MED ORDER — ACETAMINOPHEN 325 MG PO TABS: 325.0000 mg | ORAL_TABLET | Freq: Four times a day (QID) | ORAL | Status: DC | PRN

## 2022-10-31 MED ORDER — CEFAZOLIN SODIUM-DEXTROSE 2-4 GM/100ML-% IV SOLN
2.0000 g | Freq: Once | INTRAVENOUS | Status: AC
  Administered 2022-10-31: 2 g via INTRAVENOUS

## 2022-10-31 MED ORDER — OXYCODONE HCL 5 MG PO TABS: 5.0000 mg | ORAL_TABLET | Freq: Once | ORAL | Status: DC | PRN

## 2022-10-31 MED ORDER — HYDROCODONE-ACETAMINOPHEN 5-325 MG PO TABS
1.0000 | ORAL_TABLET | ORAL | Status: DC | PRN
  Administered 2022-11-02 – 2022-11-03 (×4): 2 via ORAL
  Filled 2022-10-31 (×4): qty 2

## 2022-10-31 MED ORDER — CEFAZOLIN SODIUM-DEXTROSE 2-4 GM/100ML-% IV SOLN
INTRAVENOUS | Status: AC
  Filled 2022-10-31: qty 100

## 2022-10-31 MED ORDER — KETAMINE HCL 50 MG/5ML IJ SOSY
PREFILLED_SYRINGE | INTRAMUSCULAR | Status: AC
  Filled 2022-10-31: qty 5

## 2022-10-31 MED ORDER — FENTANYL CITRATE (PF) 100 MCG/2ML IJ SOLN: 25.0000 ug | INTRAMUSCULAR | Status: DC | PRN

## 2022-10-31 MED ORDER — PHENYLEPHRINE 80 MCG/ML (10ML) SYRINGE FOR IV PUSH (FOR BLOOD PRESSURE SUPPORT)
PREFILLED_SYRINGE | INTRAVENOUS | Status: DC | PRN
  Administered 2022-10-31: 80 ug via INTRAVENOUS
  Administered 2022-10-31: 240 ug via INTRAVENOUS
  Administered 2022-10-31: 160 ug via INTRAVENOUS

## 2022-10-31 SURGICAL SUPPLY — 76 items
ADH SKN CLS APL DERMABOND .7 (GAUZE/BANDAGES/DRESSINGS) ×1
APL PRP STRL LF DISP 70% ISPRP (MISCELLANEOUS) ×2
BLADE SAGITTAL AGGR TOOTH XLG (BLADE) ×1 IMPLANT
BNDG CMPR 5X6 CHSV STRCH STRL (GAUZE/BANDAGES/DRESSINGS) ×1
BNDG COHESIVE 6X5 TAN ST LF (GAUZE/BANDAGES/DRESSINGS) ×1 IMPLANT
BOWL CEMENT MIXING ADV NOZZLE (MISCELLANEOUS) IMPLANT
CEMENT BONE 1-PACK (Cement) IMPLANT
CHLORAPREP W/TINT 26 (MISCELLANEOUS) ×2 IMPLANT
COVER BACK TABLE REUSABLE LG (DRAPES) ×1 IMPLANT
COVER SET STULBERG POSITIONER (MISCELLANEOUS) ×1 IMPLANT
DERMABOND ADVANCED .7 DNX12 (GAUZE/BANDAGES/DRESSINGS) ×1 IMPLANT
DRAPE 3/4 80X56 (DRAPES) ×1 IMPLANT
DRAPE IMP U-DRAPE 54X76 (DRAPES) ×1 IMPLANT
DRAPE INCISE IOBAN 66X60 STRL (DRAPES) ×1 IMPLANT
DRAPE POUCH INSTRU U-SHP 10X18 (DRAPES) ×1 IMPLANT
DRAPE U-SHAPE 47X51 STRL (DRAPES) ×1 IMPLANT
DRSG MEPILEX SACRM 8.7X9.8 (GAUZE/BANDAGES/DRESSINGS) ×1 IMPLANT
DRSG OPSITE POSTOP 4X10 (GAUZE/BANDAGES/DRESSINGS) IMPLANT
DRSG OPSITE POSTOP 4X12 (GAUZE/BANDAGES/DRESSINGS) IMPLANT
DRSG OPSITE POSTOP 4X8 (GAUZE/BANDAGES/DRESSINGS) IMPLANT
ELECT REM PT RETURN 9FT ADLT (ELECTROSURGICAL) ×1
ELECTRODE REM PT RTRN 9FT ADLT (ELECTROSURGICAL) ×1 IMPLANT
GLOVE BIO SURGEON STRL SZ8 (GLOVE) ×1 IMPLANT
GLOVE BIOGEL PI IND STRL 8 (GLOVE) ×1 IMPLANT
GLOVE PI ORTHO PRO STRL 7.5 (GLOVE) ×2 IMPLANT
GLOVE PI ORTHO PRO STRL SZ8 (GLOVE) ×2 IMPLANT
GLOVE SURG SYN 7.5  E (GLOVE) ×2
GLOVE SURG SYN 7.5 E (GLOVE) ×2 IMPLANT
GLOVE SURG SYN 7.5 PF PI (GLOVE) ×2 IMPLANT
GOWN SRG XL LVL 3 NONREINFORCE (GOWNS) ×1 IMPLANT
GOWN STRL NON-REIN TWL XL LVL3 (GOWNS) ×1
GOWN STRL REUS W/ TWL LRG LVL3 (GOWN DISPOSABLE) ×1 IMPLANT
GOWN STRL REUS W/ TWL XL LVL3 (GOWN DISPOSABLE) ×1 IMPLANT
GOWN STRL REUS W/TWL LRG LVL3 (GOWN DISPOSABLE) ×1
GOWN STRL REUS W/TWL XL LVL3 (GOWN DISPOSABLE) ×1
HANDLE YANKAUER SUCT OPEN TIP (MISCELLANEOUS) ×1 IMPLANT
HEAD CERAMIC V40 BIOLOX DEL 28 (Orthopedic Implant) IMPLANT
HEAD UNIV BIP OD 28X51 (Head) IMPLANT
HOLDER FOLEY CATH W/STRAP (MISCELLANEOUS) ×1 IMPLANT
HOOD PEEL AWAY T7 (MISCELLANEOUS) ×2 IMPLANT
IV NS IRRIG 3000ML ARTHROMATIC (IV SOLUTION) ×1 IMPLANT
KIT PREP HIP W/CEMENT RESTRICT (Miscellaneous) IMPLANT
KIT PREPARATION TOTAL HIP (Miscellaneous) ×1 IMPLANT
KIT TURNOVER KIT A (KITS) ×1 IMPLANT
MANIFOLD NEPTUNE II (INSTRUMENTS) ×1 IMPLANT
MAT ABSORB  FLUID 56X50 GRAY (MISCELLANEOUS) ×1
MAT ABSORB FLUID 56X50 GRAY (MISCELLANEOUS) ×1 IMPLANT
NDL SPNL 20GX3.5 QUINCKE YW (NEEDLE) ×1 IMPLANT
NEEDLE SPNL 20GX3.5 QUINCKE YW (NEEDLE) ×1 IMPLANT
PACK HIP PROSTHESIS (MISCELLANEOUS) ×1 IMPLANT
PENCIL SMOKE EVACUATOR (MISCELLANEOUS) ×1 IMPLANT
PILLOW ABDUCTION FOAM SM (MISCELLANEOUS) ×1 IMPLANT
PULSAVAC PLUS IRRIG FAN TIP (DISPOSABLE) ×1
RETRIEVER SUT HEWSON (MISCELLANEOUS) ×1 IMPLANT
SLEEVE SCD COMPRESS KNEE MED (STOCKING) ×1 IMPLANT
SOLUTION IRRIG SURGIPHOR (IV SOLUTION) ×1 IMPLANT
SPACER FEM CMT HIP C 6/7 16 (Spacer) IMPLANT
STEM FEM CEMT 49X158 SZ6 127D (Stem) IMPLANT
SUT BONE WAX W31G (SUTURE) ×1 IMPLANT
SUT DVC 2 QUILL PDO  T11 36X36 (SUTURE) ×1
SUT DVC 2 QUILL PDO T11 36X36 (SUTURE) ×1 IMPLANT
SUT ETHIBOND #5 BRAIDED 30INL (SUTURE) ×1 IMPLANT
SUT QUILL MONODERM 3-0 PS-2 (SUTURE) ×1 IMPLANT
SUT VIC AB 0 CT1 36 (SUTURE) ×1 IMPLANT
SUT VIC AB 2-0 CT2 27 (SUTURE) ×2 IMPLANT
SUT VICRYL 1-0 27IN ABS (SUTURE) ×1
SUTURE VICRYL 1-0 27IN ABS (SUTURE) ×1 IMPLANT
SYR 10ML LL (SYRINGE) ×1 IMPLANT
SYR 30ML LL (SYRINGE) ×2 IMPLANT
TIP FAN IRRIG PULSAVAC PLUS (DISPOSABLE) ×1 IMPLANT
TOWEL OR 17X26 4PK STRL BLUE (TOWEL DISPOSABLE) IMPLANT
TRAP FLUID SMOKE EVACUATOR (MISCELLANEOUS) ×1 IMPLANT
TRAY FOLEY SLVR 16FR LF STAT (SET/KITS/TRAYS/PACK) ×1 IMPLANT
TUBE KAMVAC SUCTION (TUBING) IMPLANT
WAND WEREWOLF FASTSEAL 6.0 (MISCELLANEOUS) ×1 IMPLANT
WATER STERILE IRR 1000ML POUR (IV SOLUTION) ×1 IMPLANT

## 2022-10-31 NOTE — Anesthesia Procedure Notes (Signed)
Arterial Line Insertion Start/End5/16/2024 1:28 AM, 10/31/2022 1:30 AM Performed by: Stephanie Coup, MD, anesthesiologist  Patient location: OR. Preanesthetic checklist: patient identified, IV checked, site marked, risks and benefits discussed, surgical consent, monitors and equipment checked, pre-op evaluation, timeout performed and anesthesia consent Lidocaine 1% used for infiltration Right, radial was placed Catheter size: 20 G Hand hygiene performed  and maximum sterile barriers used   Attempts: 1 Procedure performed using ultrasound guided technique. Ultrasound Notes:anatomy identified, needle tip was noted to be adjacent to the nerve/plexus identified and no ultrasound evidence of intravascular and/or intraneural injection Following insertion, dressing applied and Biopatch. Post procedure assessment: normal and unchanged  Patient tolerated the procedure well with no immediate complications.

## 2022-10-31 NOTE — Op Note (Signed)
Patient Name: Matthew Zamora  LKG:[id number]  Pre-Operative Diagnosis: Displaced right femoral neck fracture  Post-Operative Diagnosis: (same)  Procedure: Right hip hemiarthroplasty  Components/Implants:  Stem Accolade C 127 deg with distal spacer  Head: 95mm/28mm with +43mm ceramic 28mm inner ball  Date of Surgery: 10/31/2022  Surgeon: Reinaldo Berber MD  Assistant: RNFA Shon Hale (present and scrubbed throughout the case, critical for assistance with exposure, retraction, instrumentation, and closure)  Anesthesiologist: Suzan Slick  Anesthesia: General   IVF:1300cc Albumin: 250cc PRBC: 2 units  EBL: 600cc  Surgical Complications: None   Brief history: The patient is a 82 year old male presented to the emergency room with a displaced right femoral neck fracture after mechanical fall.  The risks and benefits of hip hemiarthroplasty as definitive surgical treatment were discussed with the patient, who opted to proceed with the operation.  After medical clearance from hospitalist service the patient was optimized for the operating room.  All preoperative films were reviewed and an appropriate surgical plan was made prior to surgery.   Description of procedure: The patient was brought to the operating room where laterality was confirmed by all those present to be the right side.  The patient was moved to the table and administered spinal anesthesia. Patient was given an intravenous dose of antibiotics for surgical prophylaxis and TXA . The patient was positioned in lateral decubitus position with all bony prominences well-padded.  Surgical site was prepped with alcohol and chlorhexidine.  Surgical site over the hip was draped in typical sterile fashion with multiple layers of adhesive and nonadhesive drapes.  The incision site over the greater trochanter posteriorly was marked out with a sterile marker.   Surgical timeout was then called with participation of all staff in the room the patient  was confirmed and laterality again confirmed.  An incision was made over the lateral aspect of the hip cheating posteriorly on the proximal aspect.  Careful soft tissue dissection and coagulation of all bleeders was carried out down to the level of the glut max fascia.  The fascia was carefully incised in line with the femur.  A Charnley retractor was placed deep to the fascia with care taken to ensure that there was no nerve entrapment in the retractor.  The bursa tissue was taken down over the posterior femur exposing the external rotators.  A dull Cobra retractor was placed under the abductor mechanism to protect the mechanism and fully expose the piriformis and short external rotators.  The external rotators were carefully detached from the femur with electrocautery and tagged with Ethibond sutures.  The capsule to the hip was incised and tagged with sutures care was taken to preserve the labral tissue.  The fracture site was encountered at this time with obvious displacement.  The hip was exposed, Cobra retractors were placed.  Inferior to the remaining femoral neck and a template was used to perform a cleanup cut with an oscillating saw.  A corkscrew was then used to carefully remove the remaining femoral head without difficulty.  Implant head trials were then used to assess appropriate acetabular size and I was found to have good fit and suction fill with a 51 mm head.  Attention was turned back to the femur and a femoral neck retractor was placed.  The femur was opened with a box osteotome and canal finder/lateralizer.  The femur was then sequentially broached up to a size 6 broach which allowed for good fit and fill.  A calcar planer was then used to smooth  out the calcar.  A trial neck and head were then attached and the hip was reduced.  The hip was found to be stable on reduction with full range of motion without subluxation or dislocation and leg lengths felt equal.  The hip was then carefully  dislocated head and neck trial was removed.     The femur was then exposed the broach was removed the canal was irrigated.  A distal cement restrictor was placed the canal was then scrubbed with a brush copiously irrigated and dried with a canal suction dryer.  Cement was then mixed with a cement gun and cement was placed within the canal and gently pressurized.  The real femoral stem was then impacted into place with appropriate version and held until the cement cured.  During this period of time there was open communication between surgical team and anesthesia to ensure good blood pressure control.  After the cement cured the hip was then retrialed and found to be stable with good leg lengths. The real dual mobility ball was then placed on the femoral component.  The acetabulum was irrigated and the hip was reduced.  The hip showed good range of motion and stability on testing with both stability in flexion internal rotation and extension external rotation.  The hip was then irrigated with betadine based surgiphor solution and pulsatile lavage with saline.  A 2 mm drill bit was then used to make 2 holes in the greater trochanter the piriformis and capsular tissues were reapproximated and passed through the drill holes and tied over the greater trochanter.  The fascia was then approximated with #1 Vicryl and #2 barbed suture.  The subcutaneous tissues and skin were closed with 0 Vicryl 2-0 Vicryl and 3-0 V lock suture and the skin closed with Dermabond.  A sterile dressing was then applied.  Lap, sharps, and sponge counts were correct at the end of the case.   The patient was then rolled supine and an x-ray was taken in the operating room. Leg lengths were clinically equal on examination with a good distal pulse. Components appeared in good position with no fractures noted on x-ray.  Patient was then transferred to a hospital bed and transferred to the recovery room in stable condition, extubated, and off of  any pressure support medications.

## 2022-10-31 NOTE — Interval H&P Note (Signed)
Patient history and physical updated. Consent reviewed including risks, benefits, and alternatives to surgery. Patient agrees with above plan to proceed with right hip cemented hemiarthroplasty.

## 2022-10-31 NOTE — Progress Notes (Signed)
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  Hemodynamically stable in the emergency room.  Admitted with orthopedic consultation.  Last dose of Eliquis 5/15 morning.   Assessment & Plan:   Closed traumatic right femoral fracture: N.p.o., adequate pain medications.  Nonweightbearing.  Anticipated ORIF today.  Postop plan after surgery.  Paroxysmal A-fib: Currently sinus rhythm.  Rate controlled.  Eliquis on hold.  Chronic hypotension, a.m. cortisol adequate.  Patient on midodrine that is continued.  History of coronary artery disease, stable on aspirin and Eliquis.  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  On 2 L oxygen.  CKD stage III AA-B: History of baseline creatinine about 1.4-1.5.  Developed hyperkalemia.  Improved with Lokelma.  Monitor while acutely ill.   DVT prophylaxis: enoxaparin (LOVENOX) injection 40 mg Start: 10/31/22 2100 SCDs Start: 10/30/22 2144   Code Status: Full code Family Communication: Daughter-in-law at the bedside Disposition Plan: Status is: Inpatient Remains inpatient appropriate because: Inpatient surgery planned     Consultants:  Orthopedics  Procedures:  Surgery planned  Antimicrobials:  None   Subjective: Patient seen in the morning rounds.  His daughter-in-law arrived.  Patient tells me that he has moderate pain on his right hip.  He twisted his hip at home.  Denies any chest pain or shortness or palpitations.  He has discussed with orthopedics and has consented for procedure.  Objective: Vitals:   10/30/22 2232 10/31/22  0825 10/31/22 0828 10/31/22 1210  BP: (!) 159/101 137/86 114/89 102/73  Pulse:  (!) 115 (!) 104 (!) 102  Resp: 20 18 16 17   Temp: (!) 97.5 F (36.4 C) 97.6 F (36.4 C) 97.9 F (36.6 C) 98.1 F (36.7 C)  TempSrc:   Oral Oral  SpO2: 97% 99% 98% 96%  Weight:    61.2 kg  Height:    5\' 8"  (1.727 m)    Intake/Output Summary (Last 24 hours) at 10/31/2022 1410 Last data filed at 10/31/2022 1341 Gross per 24 hour  Intake 526.48 ml  Output 300 ml  Net 226.48 ml   Filed Weights   10/30/22 1912 10/31/22 1210  Weight: 59.9 kg 61.2 kg    Examination:  General exam: Appears calm and comfortable Frail looking.  Not in any distress.  Pleasant to conversation. Respiratory system: No added sounds.  Comfortable on 2 L oxygen. Cardiovascular system: S1 & S2 heard, RRR.  No pedal edema. Gastrointestinal system: Abdomen is nondistended, soft and nontender. No organomegaly or masses felt. Normal bowel sounds heard. Central nervous system: Alert and oriented. No focal neurological deficits. Extremities: Slightly deformed right hip with internal rotation.  Distal neurovascular status intact.    Data Reviewed: I have personally reviewed following labs and imaging studies  CBC: Recent Labs  Lab 10/30/22 1938 10/31/22 0611 10/31/22 1354  WBC 8.9 8.1 7.3  NEUTROABS 7.4  --   --   HGB 10.1* 9.9* 8.8*  HCT 32.1* 32.3* 28.3*  MCV 93.9 94.2 93.7  PLT 180 179 159   Basic Metabolic Panel: Recent Labs  Lab 10/30/22 1938 10/31/22 0611 10/31/22 1046  NA 134* 135 133*  K 4.7 5.2* 4.8  CL 102 101 100  CO2 23 27 24   GLUCOSE 105* 98 83  BUN 15 18 19   CREATININE 1.49* 1.62* 1.62*  CALCIUM 8.9 9.1 8.9  PHOS  --  4.1  --    GFR: Estimated Creatinine Clearance: 31.5 mL/min (A) (by C-G formula based on SCr of 1.62 mg/dL (H)). Liver Function Tests: Recent Labs  Lab 10/31/22 0611  AST 24  ALT 19  ALKPHOS 71  BILITOT 0.8  PROT 6.5  ALBUMIN 3.6   No results for input(s): "LIPASE",  "AMYLASE" in the last 168 hours. No results for input(s): "AMMONIA" in the last 168 hours. Coagulation Profile: Recent Labs  Lab 10/31/22 0611  INR 1.2   Cardiac Enzymes: No results for input(s): "CKTOTAL", "CKMB", "CKMBINDEX", "TROPONINI" in the last 168 hours. BNP (last 3 results) No results for input(s): "PROBNP" in the last 8760 hours. HbA1C: No results for input(s): "HGBA1C" in the last 72 hours. CBG: No results for input(s): "GLUCAP" in the last 168 hours. Lipid Profile: No results for input(s): "CHOL", "HDL", "LDLCALC", "TRIG", "CHOLHDL", "LDLDIRECT" in the last 72 hours. Thyroid Function Tests: No results for input(s): "TSH", "T4TOTAL", "FREET4", "T3FREE", "THYROIDAB" in the last 72 hours. Anemia Panel: No results for input(s): "VITAMINB12", "FOLATE", "FERRITIN", "TIBC", "IRON", "RETICCTPCT" in the last 72 hours. Sepsis Labs: No results for input(s): "PROCALCITON", "LATICACIDVEN" in the last 168 hours.  No results found for this or any previous visit (from the past 240 hour(s)).       Radiology Studies: CT Hip Right Wo Contrast  Result Date: 10/30/2022 CLINICAL DATA:  Right femoral fracture EXAM: CT OF THE RIGHT HIP WITHOUT CONTRAST TECHNIQUE: Multidetector CT imaging of the right hip was performed according to the standard protocol. Multiplanar CT image reconstructions were also generated. RADIATION DOSE REDUCTION: This exam was performed according to the departmental dose-optimization program which includes automated exposure control, adjustment of the mA and/or kV according to patient size and/or use of iterative reconstruction technique. COMPARISON:  Right hips and pelvis radiograph dated 10/30/2022 FINDINGS: Bones/Joint/Cartilage Acute transcervical fracture of the right proximal femur with apex anterior angulation. The right femoroacetabular joint is intact. Ligaments Suboptimally assessed by CT. Muscles and Tendons Grossly intact. Soft tissues Vascular calcifications  of the partially imaged right lower extremity arteries. IMPRESSION: Mildly angulated right femoral transcervical fracture. Electronically Signed   By: Agustin Cree M.D.   On: 10/30/2022 20:49   DG Knee 2 Views Right  Result Date: 10/30/2022 CLINICAL DATA:  Right knee pain, fall EXAM: RIGHT KNEE - 1-2 VIEW COMPARISON:  None Available. FINDINGS: Moderate tricompartment degenerative changes with joint space narrowing and spurring. No joint effusion. No acute bony abnormality. Specifically, no fracture, subluxation, or dislocation. IMPRESSION: Moderate degenerative changes.  No acute bony abnormality. Electronically Signed   By: Charlett Nose M.D.   On: 10/30/2022 20:25   DG Hip Unilat W or Wo Pelvis 2-3 Views Right  Result Date: 10/30/2022 CLINICAL DATA:  Fall, right hip pain EXAM: DG HIP (WITH OR WITHOUT PELVIS) 2-3V RIGHT COMPARISON:  None Available. FINDINGS: There is foreshortening of the right femoral neck with cortical irregularity noted laterally concerning for right femoral neck fracture. No subluxation or dislocation. Mild symmetric degenerative changes in the hips with joint space narrowing and spurring. IMPRESSION: Foreshortening of the right femoral neck with cortical irregularity laterally concerning for femoral neck fracture. Electronically Signed   By: Charlett Nose M.D.   On: 10/30/2022 20:24   DG Chest Portable 1  View  Result Date: 10/30/2022 CLINICAL DATA:  Fall, right hip pain EXAM: PORTABLE CHEST 1 VIEW COMPARISON:  09/14/2022 FINDINGS: Left AICD remains in place, unchanged. Heart is normal size. Mediastinal contours within normal limits. No confluent opacities or effusions. No acute bony abnormality. IMPRESSION: No active disease. Electronically Signed   By: Charlett Nose M.D.   On: 10/30/2022 20:22   CT HEAD WO CONTRAST ( )  Result Date: 10/30/2022 CLINICAL DATA:  Trip and fall injury. EXAM: CT HEAD WITHOUT CONTRAST CT CERVICAL SPINE WITHOUT CONTRAST TECHNIQUE: Multidetector CT imaging  of the head and cervical spine was performed following the standard protocol without intravenous contrast. Multiplanar CT image reconstructions of the cervical spine were also generated. RADIATION DOSE REDUCTION: This exam was performed according to the departmental dose-optimization program which includes automated exposure control, adjustment of the mA and/or kV according to patient size and/or use of iterative reconstruction technique. COMPARISON:  Head CT without contrast 04/17/2022, cervical spine CT 03/27/2022. FINDINGS: CT HEAD FINDINGS Brain: There is mild atrophy, small-vessel disease and atrophic ventriculomegaly. No new asymmetry is seen concerning for an acute infarct, hemorrhage or mass. There is no midline shift. The basal cisterns are clear. Vascular: There are patchy calcifications of the carotid siphons but no hyperdense central vessels. Skull: No fracture or focal lesions.  Mild calvarial osteopenia. Sinuses/Orbits: No acute findings. Clear visualized sinuses and mastoid air cells. Negative orbits apart from old lens replacements. Other: None. CT CERVICAL SPINE FINDINGS Alignment: Unchanged. There is a trace 2 mm C3-4 retrolisthesis, similar minimal C4-5 anterolisthesis and trace C5-6 and C6-7 retrolisthesis all believed degenerative and all unchanged. No traumatic listhesis is seen. There is bone-on-bone anterior atlantodental joint space loss with osteophytes, and degenerative cystic changes in the odontoid process. Skull base and vertebrae: No acute fracture is evident. Bone mineralization is osteopenic. No primary bone lesion or focal pathologic process is seen. Soft tissues and spinal canal: No prevertebral fluid or swelling. No visible canal hematoma. There are calcifications in both proximal cervical ICAs, heaviest on the left where there is probably a flow-limiting origin stenosis. No laryngeal or thyroid mass. Disc levels: There is moderate disc space loss again at C3-4, C5-6 and C6-7,  mild disc narrowing at C3-4, normal disc heights at C2-3 and C7-T1. There are small bidirectional endplate spurs from C3-4 through C6-7, causing partial effacement of the ventral CSF without spondylotic cord compression. Facet joint spurring on the left-greater-than-right is seen at most levels with bilateral uncinate spurring. Acquired foraminal stenosis is mild on the right at C2-3, severe on the left and moderate on the right at C3-4, bilaterally mild-to-moderate C6-7, and not seen at the remaining levels. Upper chest: Pacemaker wiring is partially visible entering from the left. Otherwise negative. Other: None. IMPRESSION: 1. No acute intracranial CT findings or depressed skull fractures. 2. Osteopenia and degenerative change without evidence of cervical fractures or traumatic listhesis. 3. 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 Follow-up as indicated. Electronically Signed   By: Almira Bar M.D.   On: 10/30/2022 20:15   CT Cervical Spine Wo Contrast  Result Date: 10/30/2022 CLINICAL DATA:  Trip and fall injury. EXAM: CT HEAD WITHOUT CONTRAST CT CERVICAL SPINE WITHOUT CONTRAST TECHNIQUE: Multidetector CT imaging of the head and cervical spine was performed following the standard protocol without intravenous contrast. Multiplanar CT image reconstructions of the cervical spine were also generated. RADIATION DOSE REDUCTION: This exam was performed according to the departmental dose-optimization program which includes automated  exposure control, adjustment of the mA and/or kV according to patient size and/or use of iterative reconstruction technique. COMPARISON:  Head CT without contrast 04/17/2022, cervical spine CT 03/27/2022. FINDINGS: CT HEAD FINDINGS Brain: There is mild atrophy, small-vessel disease and atrophic ventriculomegaly. No new asymmetry is seen concerning for an acute infarct, hemorrhage or mass. There is no midline shift. The basal  cisterns are clear. Vascular: There are patchy calcifications of the carotid siphons but no hyperdense central vessels. Skull: No fracture or focal lesions.  Mild calvarial osteopenia. Sinuses/Orbits: No acute findings. Clear visualized sinuses and mastoid air cells. Negative orbits apart from old lens replacements. Other: None. CT CERVICAL SPINE FINDINGS Alignment: Unchanged. There is a trace 2 mm C3-4 retrolisthesis, similar minimal C4-5 anterolisthesis and trace C5-6 and C6-7 retrolisthesis all believed degenerative and all unchanged. No traumatic listhesis is seen. There is bone-on-bone anterior atlantodental joint space loss with osteophytes, and degenerative cystic changes in the odontoid process. Skull base and vertebrae: No acute fracture is evident. Bone mineralization is osteopenic. No primary bone lesion or focal pathologic process is seen. Soft tissues and spinal canal: No prevertebral fluid or swelling. No visible canal hematoma. There are calcifications in both proximal cervical ICAs, heaviest on the left where there is probably a flow-limiting origin stenosis. No laryngeal or thyroid mass. Disc levels: There is moderate disc space loss again at C3-4, C5-6 and C6-7, mild disc narrowing at C3-4, normal disc heights at C2-3 and C7-T1. There are small bidirectional endplate spurs from C3-4 through C6-7, causing partial effacement of the ventral CSF without spondylotic cord compression. Facet joint spurring on the left-greater-than-right is seen at most levels with bilateral uncinate spurring. Acquired foraminal stenosis is mild on the right at C2-3, severe on the left and moderate on the right at C3-4, bilaterally mild-to-moderate C6-7, and not seen at the remaining levels. Upper chest: Pacemaker wiring is partially visible entering from the left. Otherwise negative. Other: None. IMPRESSION: 1. No acute intracranial CT findings or depressed skull fractures. 2. Osteopenia and degenerative change without  evidence of cervical fractures or traumatic listhesis. 3. 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 Follow-up as indicated. Electronically Signed   By: Almira Bar M.D.   On: 10/30/2022 20:15        Scheduled Meds:  [MAR Hold] acetaminophen  650 mg Oral Q4H   [MAR Hold] aspirin  81 mg Oral Q1200   [MAR Hold] atorvastatin  80 mg Oral Daily   [MAR Hold] calcium carbonate  750 mg Oral Daily   [MAR Hold] enoxaparin (LOVENOX) injection  40 mg Subcutaneous Q24H   [MAR Hold] midodrine  15 mg Oral TID WC   [MAR Hold] mometasone-formoterol  2 puff Inhalation BID   [MAR Hold] oxyCODONE  10 mg Oral Q12H   [MAR Hold] pantoprazole  40 mg Oral BID   [MAR Hold] polyethylene glycol  17 g Oral BID   [MAR Hold] pyridostigmine  60 mg Oral Q8H   [MAR Hold] sertraline  25 mg Oral Daily   [MAR Hold] sodium chloride flush  3 mL Intravenous Q12H   [MAR Hold] sucralfate  1 g Oral TID WC & HS   [MAR Hold] tamsulosin  0.4 mg Oral Daily   Continuous Infusions:  sodium chloride 100 mL/hr at 10/31/22 1301   lactated ringers     tranexamic acid       LOS: 1 day    Time spent: 35 minutes    Dorcas Carrow,  MD [facility] Pager [phone]

## 2022-10-31 NOTE — Anesthesia Procedure Notes (Signed)
Procedure Name: Intubation Date/Time: 10/31/2022 1:35 PM  Performed by: Emeterio Reeve, CRNAPre-anesthesia Checklist: Patient identified, Emergency Drugs available, Suction available and Patient being monitored Patient Re-evaluated:Patient Re-evaluated prior to induction Oxygen Delivery Method: Circle system utilized Preoxygenation: Pre-oxygenation with 100% oxygen Induction Type: IV induction Ventilation: Mask ventilation without difficulty Laryngoscope Size: McGraph and 4 Grade View: Grade I Tube type: Oral Tube size: 7.0 mm Number of attempts: 1 Airway Equipment and Method: Stylet and Oral airway Placement Confirmation: ETT inserted through vocal cords under direct vision, positive ETCO2 and breath sounds checked- equal and bilateral Secured at: 22 cm Tube secured with: Tape Dental Injury: Teeth and Oropharynx as per pre-operative assessment  Comments: Cords clear. CA

## 2022-10-31 NOTE — Anesthesia Postprocedure Evaluation (Signed)
Anesthesia Post Note  Patient: Matthew Zamora  Procedure(s) Performed: ARTHROPLASTY BIPOLAR HIP (HEMIARTHROPLASTY)-RNFA (Right: Hip)  Patient location during evaluation: PACU Anesthesia Type: General Level of consciousness: awake and alert Pain management: pain level controlled Vital Signs Assessment: post-procedure vital signs reviewed and stable Respiratory status: spontaneous breathing, nonlabored ventilation, respiratory function stable and patient connected to nasal cannula oxygen Cardiovascular status: blood pressure returned to baseline and stable Postop Assessment: no apparent nausea or vomiting Anesthetic complications: no   No notable events documented.   Last Vitals:  Vitals:   10/31/22 1630 10/31/22 1635  BP: 95/61 (!) 103/59  Pulse: (!) 109 96  Resp: 13 12  Temp:    SpO2: 97% 98%    Last Pain:  Vitals:   10/31/22 1630  TempSrc:   PainSc: 0-No pain                 Louie Boston

## 2022-10-31 NOTE — Anesthesia Preprocedure Evaluation (Addendum)
Anesthesia Evaluation  Patient identified by MRN, date of birth, ID band Patient awake    Reviewed: Allergy & Precautions, NPO status , Patient's Chart, lab work & pertinent test results  History of Anesthesia Complications Negative for: history of anesthetic complications  Airway Mallampati: III  TM Distance: >3 FB Neck ROM: Full    Dental  (+) Chipped   Pulmonary shortness of breath and with exertion, neg sleep apnea, COPD,  COPD inhaler and oxygen dependent, Patient abstained from smoking.Not current smoker, former smoker   Pulmonary exam normal breath sounds clear to auscultation       Cardiovascular Exercise Tolerance: Poor METShypertension, + CAD, +CHF and + DOE  (-) Past MI (-) dysrhythmias + Cardiac Defibrillator  Rhythm:Regular Rate:Normal - Systolic murmurs TTE 2023:  1. Left ventricular ejection fraction, by estimation, is 30 to 35%. The  left ventricle has moderately decreased function. The left ventricle  demonstrates global hypokinesis. The left ventricular internal cavity size  was mildly to moderately dilated.  Left ventricular diastolic parameters are consistent with Grade III  diastolic dysfunction (restrictive).   2. Right ventricular systolic function is low normal. The right  ventricular size is mildly enlarged.   3. Left atrial size was moderately dilated.   4. Right atrial size was mild to moderately dilated.   5. The mitral valve is normal in structure. Trivial mitral valve  regurgitation.   6. The aortic valve is normal in structure. Aortic valve regurgitation is  not visualized. Aortic valve sclerosis/calcification is present, without  any evidence of aortic stenosis.   Per cardiology note 04/2022:  "82 y.o. male with known moderate dilated cardiomyopathy ventricular tachycardia nausea vomiting orthostatic hypotension status post long hospital stay with minimal ambulation now causing dizziness  weakness but still with significant issues despite discontinuation of cardiac meds 1.  Continuation of physical therapy which appears to be working for the patient and slow improvements 2.  No cardiovascular diagnostics necessary at this time 3.  Continue to sustain from any cardiac meds which could exacerbate symptoms listed above. 4.  Further considerations of changes based on physical therapy including reverse Trendelenburg, compression when ambulating. 5.  Call if further questions Signed, Arnoldo Hooker M.D. 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 pylori infection No date: History of hepatitis No date: Hyperlipidemia No date: Hypertension 05/16/15: Liver cyst No date: PAF (paroxysmal atrial fibrillation) ([name]) No date: Prostate CA ([name])  Reproductive/Obstetrics                             Anesthesia Physical Anesthesia Plan  ASA: 4  Anesthesia Plan: General   Post-op Pain Management: Dilaudid IV   Induction: Intravenous  PONV Risk Score and Plan: 3 and Ondansetron, Dexamethasone and Treatment may vary due to age or medical condition  Airway  Management Planned: Oral ETT and Video Laryngoscope Planned  Additional Equipment: Arterial line  Intra-op Plan:   Post-operative Plan: Extubation in OR  and Possible Post-op intubation/ventilation  Informed Consent: I have reviewed the patients History and Physical, chart, labs and discussed the procedure including the risks, benefits and alternatives for the proposed anesthesia with the patient or authorized representative who has indicated his/her understanding and acceptance.     Dental advisory given  Plan Discussed with: CRNA and Surgeon  Anesthesia Plan Comments: (Discussed risks of anesthesia with patient, including PONV, sore throat, lip/dental/eye damage. Rare risks discussed as well, such as cardiorespiratory and neurological sequelae, and allergic reactions. Discussed the role of CRNA in patient's perioperative care. Patient understands. Patient counseled on being higher risk for anesthesia due to comorbidities: CHF, O2-dependent COPD. Patient was told about increased risk of cardiac and respiratory events, including death. )        Anesthesia Quick Evaluation

## 2022-10-31 NOTE — Consult Note (Signed)
ORTHOPAEDIC CONSULTATION  REQUESTING PHYSICIAN: Dorcas Carrow, MD  Chief Complaint:   Right femoral neck fracture  History of Present Illness: Matthew Zamora is a 82 y.o. male with medical history significant of COPD, CAD, Afib on eliquis presented to the emergency room yesterday after a mechanical fall.  He was found to have a displaced right femoral neck fracture and orthopedics was consulted.  Patient reports he was working on his laundry tourniquet on her shirt and his foot got stuck and he tripped landing on his right side.  Denies any loss of consciousness.  He has been unable to ambulate since the injury and was found the emergency room to have a fracture.  He denies any prior hip pain before the fall.  He reports his last dose of apixaban was around noon yesterday with lunch.  He denies any chest pain, shortness of breath, nausea, vomiting, numbness, or tingling in the leg.  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 pylori infection    History of hepatitis    Hyperlipidemia    Hypertension    Liver cyst 05/16/15   PAF (paroxysmal atrial fibrillation) ([name])    Prostate CA [facility])    Past Surgical History:  Procedure Laterality Date   CATARACT EXTRACTION     COLONOSCOPY  10/06/2014, 09/18/2004, 03/14/2009   ESOPHAGOGASTRODUODENOSCOPY  10/06/2014, 03/18/2001, 03/14/2009   ESOPHAGOGASTRODUODENOSCOPY (EGD) WITH PROPOFOL N/A 05/07/2018   Procedure: ESOPHAGOGASTRODUODENOSCOPY (EGD) WITH PROPOFOL;  Surgeon: Toledo, Boykin Nearing, MD;  Location: [facility] ENDOSCOPY;  Service: Gastroenterology;   Laterality: N/A;   ESOPHAGOGASTRODUODENOSCOPY (EGD) WITH PROPOFOL N/A 04/22/2022   Procedure: ESOPHAGOGASTRODUODENOSCOPY (EGD) WITH PROPOFOL;  Surgeon: Toney Reil, MD;  Location: [facility] ENDOSCOPY;  Service: Endoscopy;  Laterality: N/A;   FLEXIBLE SIGMOIDOSCOPY  08/26/1990   INSERTION OF ICD     PROSTATE SURGERY     TONSILLECTOMY     Social History   Socioeconomic History   Marital status: Single    Spouse name: Not on file   Number of children: Not on file   Years of education: Not on file   Highest education level: Not on file  Occupational History   Not on file  Tobacco Use   Smoking status: Former    Packs/day: 1.00    Years: 38.00    Additional pack years: 0.00    Total pack years: 38.00    Types: Cigarettes    Quit date: 06/18/1999    Years since quitting: 23.3   Smokeless tobacco: Never  Vaping Use   Vaping Use: Never used  Substance and Sexual Activity   Alcohol use: No    Alcohol/week: 0.0 standard drinks of alcohol   Drug use: Not Currently   Sexual activity: Not Currently  Other Topics Concern   Not on file  Social History Narrative   Not on file   Social Determinants of Health   Financial Resource Strain: Not on file  Food Insecurity: No Food Insecurity (10/30/2022)   Hunger Vital Sign    Worried About Running Out of Food in the Last Year: Never true    Ran Out of Food in  the Last Year: Never true  Transportation Needs: No Transportation Needs (10/30/2022)   PRAPARE - Administrator, Civil Service (Medical): No    Lack of Transportation (Non-Medical): No  Physical Activity: Not on file  Stress: Not on file  Social Connections: Not on file   Family History  Problem Relation Age of Onset   Heart attack Mother    Heart attack Father    Allergies  Allergen Reactions   Nitrofuran Derivatives Other (See Comments)    Transaminitis ** confounded w/Amiodarone, Mexiletine   Spironolactone     Significant transaminitis    Prior to  Admission medications   Medication Sig Start Date End Date Taking? Authorizing Provider  acetaminophen (TYLENOL) 325 MG tablet Take 2 tablets (650 mg total) by mouth every 4 (four) hours as needed for headache or mild pain. 10/29/19  Yes Harlon Ditty D, NP  albuterol (PROVENTIL) (2.5 MG/3ML) 0.083% nebulizer solution Inhale 3 mLs (2.5 mg total) into the lungs as needed for shortness of breath. 10/29/19  Yes Harlon Ditty D, NP  aspirin 81 MG chewable tablet Chew 81 mg by mouth daily at 12 noon. 03/30/21  Yes [provider]  atorvastatin (LIPITOR) 80 MG tablet Take 80 mg by mouth daily. 05/13/21  Yes [provider]  calcium carbonate (TUMS EX) 750 MG chewable tablet Chew 1 tablet by mouth daily.   Yes [provider]  ELIQUIS 2.5 MG TABS tablet Take 2.5 mg by mouth 2 (two) times daily. 12/28/21  Yes [provider]  fluticasone-salmeterol (ADVAIR) 500-50 MCG/ACT AEPB Inhale 1 puff into the lungs in the morning and at bedtime. 04/05/22  Yes [provider]  gabapentin (NEURONTIN) 100 MG capsule Take 100 mg by mouth at bedtime. 12/09/19  Yes [provider]  magnesium oxide (MAG-OX) 400 (240 Mg) MG tablet Take 1 tablet by mouth daily. 02/05/21  Yes [provider]  melatonin 3 MG TABS tablet Take 9 mg by mouth at bedtime.   Yes [provider]  midodrine (PROAMATINE) 5 MG tablet Take 3 tablets (15 mg total) by mouth 3 (three) times daily with meals. 05/13/22  Yes Delfino Lovett, MD  ondansetron (ZOFRAN-ODT) 4 MG disintegrating tablet Take 4 mg by mouth every 8 (eight) hours as needed for refractory nausea / vomiting, vomiting or nausea.   Yes [provider]  pantoprazole (PROTONIX) 40 MG tablet Take 1 tablet (40 mg total) by mouth 2 (two) times daily. 05/13/22  Yes Delfino Lovett, MD  pyridostigmine (MESTINON) 60 MG tablet Take 1 tablet (60 mg total) by mouth every 8 (eight) hours. 05/13/22  Yes Delfino Lovett, MD  sertraline  (ZOLOFT) 25 MG tablet Take 25 mg by mouth daily.   Yes [provider]  sucralfate (CARAFATE) 1 GM/10ML suspension Take 10 mLs (1 g total) by mouth 4 (four) times daily -  with meals and at bedtime. 05/13/22  Yes Delfino Lovett, MD  tamsulosin (FLOMAX) 0.4 MG CAPS capsule Take 0.4 mg by mouth daily. 12/09/19  Yes [provider]  albuterol (VENTOLIN HFA) 108 (90 Base) MCG/ACT inhaler Inhale 2 puffs into the lungs every 4 (four) hours as needed. 04/21/21   [provider]  ascorbic acid (VITAMIN C) 500 MG tablet Take 1 tablet by mouth daily.    [provider]  metoCLOPramide (REGLAN) 5 MG tablet Take 1 tablet (5 mg total) by mouth every 8 (eight) hours as needed for refractory nausea / vomiting. 05/13/22   Delfino Lovett, MD  Multiple Vitamin (MULTIVITAMIN WITH MINERALS) TABS tablet Take 1 tablet by mouth daily. 10/29/19   Judithe Modest, NP  Nebulizer MISC 1 each by Does not apply route as needed. 06/16/21   Rolly Salter, MD  Nutritional Supplements (,FEEDING SUPPLEMENT, PROSOURCE PLUS) liquid Take 30 mLs by mouth 3 (three) times daily between meals. 05/13/22   Delfino Lovett, MD  tadalafil (CIALIS) 20 MG tablet Take 20 mg by mouth daily. 09/04/21   [provider]   CT Hip Right Wo Contrast  Result Date: 10/30/2022 CLINICAL DATA:  Right femoral fracture EXAM: CT OF THE RIGHT HIP WITHOUT CONTRAST TECHNIQUE: Multidetector CT imaging of the right hip was performed according to the standard protocol. Multiplanar CT image reconstructions were also generated. RADIATION DOSE REDUCTION: This exam was performed according to the departmental dose-optimization program which includes automated exposure control, adjustment of the mA and/or kV according to patient size and/or use of iterative reconstruction technique. COMPARISON:  Right hips and pelvis radiograph dated 10/30/2022 FINDINGS: Bones/Joint/Cartilage Acute transcervical fracture of the right proximal femur with apex  anterior angulation. The right femoroacetabular joint is intact. Ligaments Suboptimally assessed by CT. Muscles and Tendons Grossly intact. Soft tissues Vascular calcifications of the partially imaged right lower extremity arteries. IMPRESSION: Mildly angulated right femoral transcervical fracture. Electronically Signed   By: Agustin Cree M.D.   On: 10/30/2022 20:49   DG Knee 2 Views Right  Result Date: 10/30/2022 CLINICAL DATA:  Right knee pain, fall EXAM: RIGHT KNEE - 1-2 VIEW COMPARISON:  None Available. FINDINGS: Moderate tricompartment degenerative changes with joint space narrowing and spurring. No joint effusion. No acute bony abnormality. Specifically, no fracture, subluxation, or dislocation. IMPRESSION: Moderate degenerative changes.  No acute bony abnormality. Electronically Signed   By: Charlett Nose M.D.   On: 10/30/2022 20:25   DG Hip Unilat W or Wo Pelvis 2-3 Views Right  Result Date: 10/30/2022 CLINICAL DATA:  Fall, right hip pain EXAM: DG HIP (WITH OR WITHOUT PELVIS) 2-3V RIGHT COMPARISON:  None Available. FINDINGS: There is foreshortening of the right femoral neck with cortical irregularity noted laterally concerning for right femoral neck fracture. No subluxation or dislocation. Mild symmetric degenerative changes in the hips with joint space narrowing and spurring. IMPRESSION: Foreshortening of the right femoral neck with cortical irregularity laterally concerning for femoral neck fracture. Electronically Signed   By: Charlett Nose M.D.   On: 10/30/2022 20:24   DG Chest Portable 1 View  Result Date: 10/30/2022 CLINICAL DATA:  Fall, right hip pain EXAM: PORTABLE CHEST 1 VIEW COMPARISON:  09/14/2022 FINDINGS: Left AICD remains in place, unchanged. Heart is normal size. Mediastinal contours within normal limits. No confluent opacities or effusions. No acute bony abnormality. IMPRESSION: No active disease. Electronically Signed   By: Charlett Nose M.D.   On: 10/30/2022 20:22   CT HEAD WO  CONTRAST ( )  Result Date: 10/30/2022 CLINICAL DATA:  Trip and fall injury. EXAM: CT HEAD WITHOUT CONTRAST CT CERVICAL SPINE WITHOUT CONTRAST TECHNIQUE: Multidetector CT imaging of the head and cervical spine was performed following the standard protocol without intravenous contrast. Multiplanar CT image reconstructions of the cervical spine were also generated. RADIATION DOSE REDUCTION: This exam was performed according to the departmental dose-optimization program which includes automated exposure control, adjustment of the mA and/or kV according to patient size and/or use of iterative reconstruction technique. COMPARISON:  Head CT without contrast 04/17/2022, cervical spine CT 03/27/2022. FINDINGS: CT HEAD FINDINGS Brain: There is mild atrophy, small-vessel disease and atrophic  ventriculomegaly. No new asymmetry is seen concerning for an acute infarct, hemorrhage or mass. There is no midline shift. The basal cisterns are clear. Vascular: There are patchy calcifications of the carotid siphons but no hyperdense central vessels. Skull: No fracture or focal lesions.  Mild calvarial osteopenia. Sinuses/Orbits: No acute findings. Clear visualized sinuses and mastoid air cells. Negative orbits apart from old lens replacements. Other: None. CT CERVICAL SPINE FINDINGS Alignment: Unchanged. There is a trace 2 mm C3-4 retrolisthesis, similar minimal C4-5 anterolisthesis and trace C5-6 and C6-7 retrolisthesis all believed degenerative and all unchanged. No traumatic listhesis is seen. There is bone-on-bone anterior atlantodental joint space loss with osteophytes, and degenerative cystic changes in the odontoid process. Skull base and vertebrae: No acute fracture is evident. Bone mineralization is osteopenic. No primary bone lesion or focal pathologic process is seen. Soft tissues and spinal canal: No prevertebral fluid or swelling. No visible canal hematoma. There are calcifications in both proximal cervical ICAs,  heaviest on the left where there is probably a flow-limiting origin stenosis. No laryngeal or thyroid mass. Disc levels: There is moderate disc space loss again at C3-4, C5-6 and C6-7, mild disc narrowing at C3-4, normal disc heights at C2-3 and C7-T1. There are small bidirectional endplate spurs from C3-4 through C6-7, causing partial effacement of the ventral CSF without spondylotic cord compression. Facet joint spurring on the left-greater-than-right is seen at most levels with bilateral uncinate spurring. Acquired foraminal stenosis is mild on the right at C2-3, severe on the left and moderate on the right at C3-4, bilaterally mild-to-moderate C6-7, and not seen at the remaining levels. Upper chest: Pacemaker wiring is partially visible entering from the left. Otherwise negative. Other: None. IMPRESSION: 1. No acute intracranial CT findings or depressed skull fractures. 2. Osteopenia and degenerative change without evidence of cervical fractures or traumatic listhesis. 3. 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 Follow-up as indicated. Electronically Signed   By: Almira Bar M.D.   On: 10/30/2022 20:15   CT Cervical Spine Wo Contrast  Result Date: 10/30/2022 CLINICAL DATA:  Trip and fall injury. EXAM: CT HEAD WITHOUT CONTRAST CT CERVICAL SPINE WITHOUT CONTRAST TECHNIQUE: Multidetector CT imaging of the head and cervical spine was performed following the standard protocol without intravenous contrast. Multiplanar CT image reconstructions of the cervical spine were also generated. RADIATION DOSE REDUCTION: This exam was performed according to the departmental dose-optimization program which includes automated exposure control, adjustment of the mA and/or kV according to patient size and/or use of iterative reconstruction technique. COMPARISON:  Head CT without contrast 04/17/2022, cervical spine CT 03/27/2022. FINDINGS: CT HEAD FINDINGS Brain: There is  mild atrophy, small-vessel disease and atrophic ventriculomegaly. No new asymmetry is seen concerning for an acute infarct, hemorrhage or mass. There is no midline shift. The basal cisterns are clear. Vascular: There are patchy calcifications of the carotid siphons but no hyperdense central vessels. Skull: No fracture or focal lesions.  Mild calvarial osteopenia. Sinuses/Orbits: No acute findings. Clear visualized sinuses and mastoid air cells. Negative orbits apart from old lens replacements. Other: None. CT CERVICAL SPINE FINDINGS Alignment: Unchanged. There is a trace 2 mm C3-4 retrolisthesis, similar minimal C4-5 anterolisthesis and trace C5-6 and C6-7 retrolisthesis all believed degenerative and all unchanged. No traumatic listhesis is seen. There is bone-on-bone anterior atlantodental joint space loss with osteophytes, and degenerative cystic changes in the odontoid process. Skull base and vertebrae: No acute fracture is evident. Bone mineralization is osteopenic. No primary bone  lesion or focal pathologic process is seen. Soft tissues and spinal canal: No prevertebral fluid or swelling. No visible canal hematoma. There are calcifications in both proximal cervical ICAs, heaviest on the left where there is probably a flow-limiting origin stenosis. No laryngeal or thyroid mass. Disc levels: There is moderate disc space loss again at C3-4, C5-6 and C6-7, mild disc narrowing at C3-4, normal disc heights at C2-3 and C7-T1. There are small bidirectional endplate spurs from C3-4 through C6-7, causing partial effacement of the ventral CSF without spondylotic cord compression. Facet joint spurring on the left-greater-than-right is seen at most levels with bilateral uncinate spurring. Acquired foraminal stenosis is mild on the right at C2-3, severe on the left and moderate on the right at C3-4, bilaterally mild-to-moderate C6-7, and not seen at the remaining levels. Upper chest: Pacemaker wiring is partially visible  entering from the left. Otherwise negative. Other: None. IMPRESSION: 1. No acute intracranial CT findings or depressed skull fractures. 2. Osteopenia and degenerative change without evidence of cervical fractures or traumatic listhesis. 3. 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 Follow-up as indicated. Electronically Signed   By: Almira Bar M.D.   On: 10/30/2022 20:15    Positive ROS: All other systems have been reviewed and were otherwise negative with the exception of those mentioned in the HPI and as above.  Physical Exam: General:  Alert, no acute distress Psychiatric:  Patient is competent for consent with normal mood and affect   Cardiovascular:  No pedal edema Respiratory:  No wheezing, non-labored breathing GI:  Abdomen is soft and non-tender Skin:  No lesions in the area of chief complaint Neurologic:  Sensation intact distally Lymphatic:  No axillary or cervical lymphadenopathy  Orthopedic Exam:  Right lower extremity Shortened externally rotated Skin intact some mild swelling over the hip Tender to palpation over the hip No tenderness palpation over the knee ankle or foot Able to wiggle toes dorsiflex and plantarflex the foot without pain Warm and well-perfused compartments all soft Neurovascular intact distally with good dorsalis pedis pulse  Secondary survey No tenderness to palpation over other bony prominences in the lower extremities or bilateral upper extremities.  Dressed skin tears from the fall over the posterior elbows bilaterally no surrounding erythema or drainage. No pain with logroll or simulated axial loading of the left lower extremity All compartments soft No tenderness to palpation over the cervical or thoracic spine, no bony step-off Motor grossly intact throughout, no focal deficits Sensation grossly intact throughout, no focal deficits Good distal pulses and capillary refill on all  extremities   imaging:  X-rays of the right hip and pelvis, right knee, and CT scan of the hip and pelvis images and reports reviewed by myself.  There is a displaced transcervical femoral neck fracture which extends basicervical at the inferior aspect with apex anterior angulation and displacement.  This fracture is complete on axial imaging.  No impaction of the fracture with a slight varus angulation on AP x-ray.  Agree with radiologist interpretation of the films.  Assessment: Displaced right femoral neck fracture  Plan: Duanne is an 82 year old male who presents with a right hip displaced femoral neck fracture.  I reviewed the imaging and discussed options with the patient.  Given the apex anterior angulation and the complete nature of the fracture on CT scan I do believe the patient would have a more predictable and reliable outcome with a cemented hemiarthroplasty versus an attempted open reduction internal fixation.  I discussed the risks and benefits of each with the patient and discussed alternatives including nonoperative management. A long discussion took place with the patient describing what a hemiarthroplasty is and what the procedure would entail. The xrays were reviewed with the patient and the implants were discussed. The ability to secure the implant utilizing cement/ or cementless (press fit) fixation was discussed. Surgical exposures were discussed with the patient.    The hospitalization and post-operative care and rehabilitation were also discussed. The use of perioperative antibiotics and DVT prophylaxis were discussed. The risk, benefits and alternatives to a surgical intervention were discussed at length with the patient. The patient was also advised of risks related to the medical comorbidities. A lengthy discussion took place to review the most common complications including but not limited to: deep vein thrombosis, pulmonary embolus, heart attack, stroke, infection, wound  breakdown, dislocation, numbness, leg length in-equality, damage to nerves, intraoperative fracture,  tendon,muscles, arteries or other blood vessels, death and other possible complications from anesthesia. The patient was told that we will take steps to minimize these risks by using sterile technique, antibiotics and DVT prophylaxis when appropriate and follow the patient postoperatively in the office setting to monitor progress. The possibility of recurrent pain, no improvement in pain and actual worsening of pain were also discussed with the patient. The risk of dislocation following surgery was discussed and potential precautions to prevent dislocation were reviewed.      The benefits of surgery were discussed with the patient including the potential for improving the patient's current clinical condition through operative intervention. Alternatives to surgical intervention including conservative management were also discussed in detail. All questions were answered to the satisfaction of the patient. The patient participated and agreed to the plan of care as well as the use of the recommended implants for their surgery.    Plan for surgery today N.p.o. for the operating room Hold anticoagulation Plan to recheck potassium shortly after dose of Lokelma  Plan  to the operating room early this afternoon    Reinaldo Berber MD  Beeper #:  [phone]  10/31/2022 10:14 AM

## 2022-10-31 NOTE — H&P (View-Only) (Signed)
ORTHOPAEDIC CONSULTATION  REQUESTING PHYSICIAN: Ghimire, Kuber, MD  Chief Complaint:   Right femoral neck fracture  History of Present Illness: Matthew Zamora is a 82 y.o. male with medical history significant of COPD, CAD, Afib on eliquis presented to the emergency room yesterday after a mechanical fall.  He was found to have a displaced right femoral neck fracture and orthopedics was consulted.  Patient reports he was working on his laundry tourniquet on her shirt and his foot got stuck and he tripped landing on his right side.  Denies any loss of consciousness.  He has been unable to ambulate since the injury and was found the emergency room to have a fracture.  He denies any prior hip pain before the fall.  He reports his last dose of apixaban was around noon yesterday with lunch.  He denies any chest pain, shortness of breath, nausea, vomiting, numbness, or tingling in the leg.  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 pylori infection    History of hepatitis    Hyperlipidemia    Hypertension    Liver cyst 05/16/15   PAF (paroxysmal atrial fibrillation) ([name])    Prostate CA ([name])    Past Surgical History:  Procedure Laterality Date   CATARACT EXTRACTION     COLONOSCOPY  10/06/2014, 09/18/2004, 03/14/2009   ESOPHAGOGASTRODUODENOSCOPY  10/06/2014, 03/18/2001, 03/14/2009   ESOPHAGOGASTRODUODENOSCOPY (EGD) WITH PROPOFOL N/A 05/07/2018   Procedure: ESOPHAGOGASTRODUODENOSCOPY (EGD) WITH PROPOFOL;  Surgeon: Toledo, Teodoro K, MD;  Location: [facility];  Service: Gastroenterology;   Laterality: N/A;   ESOPHAGOGASTRODUODENOSCOPY (EGD) WITH PROPOFOL N/A 04/22/2022   Procedure: ESOPHAGOGASTRODUODENOSCOPY (EGD) WITH PROPOFOL;  Surgeon: Vanga, Rohini Reddy, MD;  Location: [facility] END[facility]COPY;  Service: Endoscopy;  Laterality: N/A;   FLEXIBLE SIGMOIDOSCOPY  08/26/1990   INSERTION OF ICD     PROSTATE SURGERY     TONSILLECTOMY     Social History   Socioeconomic History   Marital status: Single    Spouse name: Not on file   Number of children: Not on file   Years of education: Not on file   Highest education level: Not on file  Occupational History   Not on file  Tobacco Use   Smoking status: Former    Packs/day: 1.00    Years: 38.00    Additional pack years: 0.00    Total pack years: 38.00    Types: Cigarettes    Quit date: 06/18/1999    Years since quitting: 23.3   Smokeless tobacco: Never  Vaping Use   Vaping Use: Never used  Substance and Sexual Activity   Alcohol use: No    Alcohol/week: 0.0 standard drinks of alcohol   Drug use: Not Currently   Sexual activity: Not Currently  Other Topics Concern   Not on file  Social History Narrative   Not on file   Social Determinants of Health   Financial Resource Strain: Not on file  Food Insecurity: No Food Insecurity (10/30/2022)   Hunger Vital Sign    Worried About Running Out of Food in the Last Year: Never true    Ran Out of Food in   the Last Year: Never true  Transportation Needs: No Transportation Needs (10/30/2022)   PRAPARE - Transportation    Lack of Transportation (Medical): No    Lack of Transportation (Non-Medical): No  Physical Activity: Not on file  Stress: Not on file  Social Connections: Not on file   Family History  Problem Relation Age of Onset   Heart attack Mother    Heart attack Father    Allergies  Allergen Reactions   Nitrofuran Derivatives Other (See Comments)    Transaminitis ** confounded w/Amiodarone, Mexiletine   Spironolactone     Significant transaminitis    Prior to  Admission medications   Medication Sig Start Date End Date Taking? Authorizing Provider  acetaminophen (TYLENOL) 325 MG tablet Take 2 tablets (650 mg total) by mouth every 4 (four) hours as needed for headache or mild pain. 10/29/19  Yes Keene, Jeremiah D, NP  albuterol (PROVENTIL) (2.5 MG/3ML) 0.083% nebulizer solution Inhale 3 mLs (2.5 mg total) into the lungs as needed for shortness of breath. 10/29/19  Yes Keene, Jeremiah D, NP  aspirin 81 MG chewable tablet Chew 81 mg by mouth daily at 12 noon. 03/30/21  Yes [provider]  atorvastatin (LIPITOR) 80 MG tablet Take 80 mg by mouth daily. 05/13/21  Yes [provider]  calcium carbonate (TUMS EX) 750 MG chewable tablet Chew 1 tablet by mouth daily.   Yes [provider]  ELIQUIS 2.5 MG TABS tablet Take 2.5 mg by mouth 2 (two) times daily. 12/28/21  Yes [provider]  fluticasone-salmeterol (ADVAIR) 500-50 MCG/ACT AEPB Inhale 1 puff into the lungs in the morning and at bedtime. 04/05/22  Yes [provider]  gabapentin (NEURONTIN) 100 MG capsule Take 100 mg by mouth at bedtime. 12/09/19  Yes [provider]  magnesium oxide (MAG-OX) 400 (240 Mg) MG tablet Take 1 tablet by mouth daily. 02/05/21  Yes [provider]  melatonin 3 MG TABS tablet Take 9 mg by mouth at bedtime.   Yes [provider]  midodrine (PROAMATINE) 5 MG tablet Take 3 tablets (15 mg total) by mouth 3 (three) times daily with meals. 05/13/22  Yes Shah, Vipul, MD  ondansetron (ZOFRAN-ODT) 4 MG disintegrating tablet Take 4 mg by mouth every 8 (eight) hours as needed for refractory nausea / vomiting, vomiting or nausea.   Yes [provider]  pantoprazole (PROTONIX) 40 MG tablet Take 1 tablet (40 mg total) by mouth 2 (two) times daily. 05/13/22  Yes Shah, Vipul, MD  pyridostigmine (MESTINON) 60 MG tablet Take 1 tablet (60 mg total) by mouth every 8 (eight) hours. 05/13/22  Yes Shah, Vipul, MD  sertraline  (ZOLOFT) 25 MG tablet Take 25 mg by mouth daily.   Yes [provider]  sucralfate (CARAFATE) 1 GM/10ML suspension Take 10 mLs (1 g total) by mouth 4 (four) times daily -  with meals and at bedtime. 05/13/22  Yes Shah, Vipul, MD  tamsulosin (FLOMAX) 0.4 MG CAPS capsule Take 0.4 mg by mouth daily. 12/09/19  Yes [provider]  albuterol (VENTOLIN HFA) 108 (90 Base) MCG/ACT inhaler Inhale 2 puffs into the lungs every 4 (four) hours as needed. 04/21/21   [provider]  ascorbic acid (VITAMIN C) 500 MG tablet Take 1 tablet by mouth daily.    [provider]  metoCLOPramide (REGLAN) 5 MG tablet Take 1 tablet (5 mg total) by mouth every 8 (eight) hours as needed for refractory nausea / vomiting. 05/13/22   Shah, Vipul, MD    Multiple Vitamin (MULTIVITAMIN WITH MINERALS) TABS tablet Take 1 tablet by mouth daily. 10/29/19   Keene, Jeremiah D, NP  Nebulizer MISC 1 each by Does not apply route as needed. 06/16/21   Patel, Pranav M, MD  Nutritional Supplements (,FEEDING SUPPLEMENT, PROSOURCE PLUS) liquid Take 30 mLs by mouth 3 (three) times daily between meals. 05/13/22   Shah, Vipul, MD  tadalafil (CIALIS) 20 MG tablet Take 20 mg by mouth daily. 09/04/21   [provider]   CT Hip Right Wo Contrast  Result Date: 10/30/2022 CLINICAL DATA:  Right femoral fracture EXAM: CT OF THE RIGHT HIP WITHOUT CONTRAST TECHNIQUE: Multidetector CT imaging of the right hip was performed according to the standard protocol. Multiplanar CT image reconstructions were also generated. RADIATION DOSE REDUCTION: This exam was performed according to the departmental dose-optimization program which includes automated exposure control, adjustment of the mA and/or kV according to patient size and/or use of iterative reconstruction technique. COMPARISON:  Right hips and pelvis radiograph dated 10/30/2022 FINDINGS: Bones/Joint/Cartilage Acute transcervical fracture of the right proximal femur with apex  anterior angulation. The right femoroacetabular joint is intact. Ligaments Suboptimally assessed by CT. Muscles and Tendons Grossly intact. Soft tissues Vascular calcifications of the partially imaged right lower extremity arteries. IMPRESSION: Mildly angulated right femoral transcervical fracture. Electronically Signed   By: Limin  Xu M.D.   On: 10/30/2022 20:49   DG Knee 2 Views Right  Result Date: 10/30/2022 CLINICAL DATA:  Right knee pain, fall EXAM: RIGHT KNEE - 1-2 VIEW COMPARISON:  None Available. FINDINGS: Moderate tricompartment degenerative changes with joint space narrowing and spurring. No joint effusion. No acute bony abnormality. Specifically, no fracture, subluxation, or dislocation. IMPRESSION: Moderate degenerative changes.  No acute bony abnormality. Electronically Signed   By: Kevin  Dover M.D.   On: 10/30/2022 20:25   DG Hip Unilat W or Wo Pelvis 2-3 Views Right  Result Date: 10/30/2022 CLINICAL DATA:  Fall, right hip pain EXAM: DG HIP (WITH OR WITHOUT PELVIS) 2-3V RIGHT COMPARISON:  None Available. FINDINGS: There is foreshortening of the right femoral neck with cortical irregularity noted laterally concerning for right femoral neck fracture. No subluxation or dislocation. Mild symmetric degenerative changes in the hips with joint space narrowing and spurring. IMPRESSION: Foreshortening of the right femoral neck with cortical irregularity laterally concerning for femoral neck fracture. Electronically Signed   By: Kevin  Dover M.D.   On: 10/30/2022 20:24   DG Chest Portable 1 View  Result Date: 10/30/2022 CLINICAL DATA:  Fall, right hip pain EXAM: PORTABLE CHEST 1 VIEW COMPARISON:  09/14/2022 FINDINGS: Left AICD remains in place, unchanged. Heart is normal size. Mediastinal contours within normal limits. No confluent opacities or effusions. No acute bony abnormality. IMPRESSION: No active disease. Electronically Signed   By: Kevin  Dover M.D.   On: 10/30/2022 20:22   CT HEAD WO  CONTRAST (5MM)  Result Date: 10/30/2022 CLINICAL DATA:  Trip and fall injury. EXAM: CT HEAD WITHOUT CONTRAST CT CERVICAL SPINE WITHOUT CONTRAST TECHNIQUE: Multidetector CT imaging of the head and cervical spine was performed following the standard protocol without intravenous contrast. Multiplanar CT image reconstructions of the cervical spine were also generated. RADIATION DOSE REDUCTION: This exam was performed according to the departmental dose-optimization program which includes automated exposure control, adjustment of the mA and/or kV according to patient size and/or use of iterative reconstruction technique. COMPARISON:  Head CT without contrast 04/17/2022, cervical spine CT 03/27/2022. FINDINGS: CT HEAD FINDINGS Brain: There is mild atrophy, small-vessel disease and atrophic   ventriculomegaly. No new asymmetry is seen concerning for an acute infarct, hemorrhage or mass. There is no midline shift. The basal cisterns are clear. Vascular: There are patchy calcifications of the carotid siphons but no hyperdense central vessels. Skull: No fracture or focal lesions.  Mild calvarial osteopenia. Sinuses/Orbits: No acute findings. Clear visualized sinuses and mastoid air cells. Negative orbits apart from old lens replacements. Other: None. CT CERVICAL SPINE FINDINGS Alignment: Unchanged. There is a trace 2 mm C3-4 retrolisthesis, similar minimal C4-5 anterolisthesis and trace C5-6 and C6-7 retrolisthesis all believed degenerative and all unchanged. No traumatic listhesis is seen. There is bone-on-bone anterior atlantodental joint space loss with osteophytes, and degenerative cystic changes in the odontoid process. Skull base and vertebrae: No acute fracture is evident. Bone mineralization is osteopenic. No primary bone lesion or focal pathologic process is seen. Soft tissues and spinal canal: No prevertebral fluid or swelling. No visible canal hematoma. There are calcifications in both proximal cervical ICAs,  heaviest on the left where there is probably a flow-limiting origin stenosis. No laryngeal or thyroid mass. Disc levels: There is moderate disc space loss again at C3-4, C5-6 and C6-7, mild disc narrowing at C3-4, normal disc heights at C2-3 and C7-T1. There are small bidirectional endplate spurs from C3-4 through C6-7, causing partial effacement of the ventral CSF without spondylotic cord compression. Facet joint spurring on the left-greater-than-right is seen at most levels with bilateral uncinate spurring. Acquired foraminal stenosis is mild on the right at C2-3, severe on the left and moderate on the right at C3-4, bilaterally mild-to-moderate C6-7, and not seen at the remaining levels. Upper chest: Pacemaker wiring is partially visible entering from the left. Otherwise negative. Other: None. IMPRESSION: 1. No acute intracranial CT findings or depressed skull fractures. 2. Osteopenia and degenerative change without evidence of cervical fractures or traumatic listhesis. 3. 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 Follow-up as indicated. Electronically Signed   By: Keith  Chesser M.D.   On: 10/30/2022 20:15   CT Cervical Spine Wo Contrast  Result Date: 10/30/2022 CLINICAL DATA:  Trip and fall injury. EXAM: CT HEAD WITHOUT CONTRAST CT CERVICAL SPINE WITHOUT CONTRAST TECHNIQUE: Multidetector CT imaging of the head and cervical spine was performed following the standard protocol without intravenous contrast. Multiplanar CT image reconstructions of the cervical spine were also generated. RADIATION DOSE REDUCTION: This exam was performed according to the departmental dose-optimization program which includes automated exposure control, adjustment of the mA and/or kV according to patient size and/or use of iterative reconstruction technique. COMPARISON:  Head CT without contrast 04/17/2022, cervical spine CT 03/27/2022. FINDINGS: CT HEAD FINDINGS Brain: There is  mild atrophy, small-vessel disease and atrophic ventriculomegaly. No new asymmetry is seen concerning for an acute infarct, hemorrhage or mass. There is no midline shift. The basal cisterns are clear. Vascular: There are patchy calcifications of the carotid siphons but no hyperdense central vessels. Skull: No fracture or focal lesions.  Mild calvarial osteopenia. Sinuses/Orbits: No acute findings. Clear visualized sinuses and mastoid air cells. Negative orbits apart from old lens replacements. Other: None. CT CERVICAL SPINE FINDINGS Alignment: Unchanged. There is a trace 2 mm C3-4 retrolisthesis, similar minimal C4-5 anterolisthesis and trace C5-6 and C6-7 retrolisthesis all believed degenerative and all unchanged. No traumatic listhesis is seen. There is bone-on-bone anterior atlantodental joint space loss with osteophytes, and degenerative cystic changes in the odontoid process. Skull base and vertebrae: No acute fracture is evident. Bone mineralization is osteopenic. No primary bone   lesion or focal pathologic process is seen. Soft tissues and spinal canal: No prevertebral fluid or swelling. No visible canal hematoma. There are calcifications in both proximal cervical ICAs, heaviest on the left where there is probably a flow-limiting origin stenosis. No laryngeal or thyroid mass. Disc levels: There is moderate disc space loss again at C3-4, C5-6 and C6-7, mild disc narrowing at C3-4, normal disc heights at C2-3 and C7-T1. There are small bidirectional endplate spurs from C3-4 through C6-7, causing partial effacement of the ventral CSF without spondylotic cord compression. Facet joint spurring on the left-greater-than-right is seen at most levels with bilateral uncinate spurring. Acquired foraminal stenosis is mild on the right at C2-3, severe on the left and moderate on the right at C3-4, bilaterally mild-to-moderate C6-7, and not seen at the remaining levels. Upper chest: Pacemaker wiring is partially visible  entering from the left. Otherwise negative. Other: None. IMPRESSION: 1. No acute intracranial CT findings or depressed skull fractures. 2. Osteopenia and degenerative change without evidence of cervical fractures or traumatic listhesis. 3. 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 Follow-up as indicated. Electronically Signed   By: Keith  Chesser M.D.   On: 10/30/2022 20:15    Positive ROS: All other systems have been reviewed and were otherwise negative with the exception of those mentioned in the HPI and as above.  Physical Exam: General:  Alert, no acute distress Psychiatric:  Patient is competent for consent with normal mood and affect   Cardiovascular:  No pedal edema Respiratory:  No wheezing, non-labored breathing GI:  Abdomen is soft and non-tender Skin:  No lesions in the area of chief complaint Neurologic:  Sensation intact distally Lymphatic:  No axillary or cervical lymphadenopathy  Orthopedic Exam:  Right lower extremity Shortened externally rotated Skin intact some mild swelling over the hip Tender to palpation over the hip No tenderness palpation over the knee ankle or foot Able to wiggle toes dorsiflex and plantarflex the foot without pain Warm and well-perfused compartments all soft Neurovascular intact distally with good dorsalis pedis pulse  Secondary survey No tenderness to palpation over other bony prominences in the lower extremities or bilateral upper extremities.  Dressed skin tears from the fall over the posterior elbows bilaterally no surrounding erythema or drainage. No pain with logroll or simulated axial loading of the left lower extremity All compartments soft No tenderness to palpation over the cervical or thoracic spine, no bony step-off Motor grossly intact throughout, no focal deficits Sensation grossly intact throughout, no focal deficits Good distal pulses and capillary refill on all  extremities   imaging:  X-rays of the right hip and pelvis, right knee, and CT scan of the hip and pelvis images and reports reviewed by myself.  There is a displaced transcervical femoral neck fracture which extends basicervical at the inferior aspect with apex anterior angulation and displacement.  This fracture is complete on axial imaging.  No impaction of the fracture with a slight varus angulation on AP x-ray.  Agree with radiologist interpretation of the films.  Assessment: Displaced right femoral neck fracture  Plan: Matthew Zamora is an 81-year-old male who presents with a right hip displaced femoral neck fracture.  I reviewed the imaging and discussed options with the patient.  Given the apex anterior angulation and the complete nature of the fracture on CT scan I do believe the patient would have a more predictable and reliable outcome with a cemented hemiarthroplasty versus an attempted open reduction internal fixation.    I discussed the risks and benefits of each with the patient and discussed alternatives including nonoperative management. A long discussion took place with the patient describing what a hemiarthroplasty is and what the procedure would entail. The xrays were reviewed with the patient and the implants were discussed. The ability to secure the implant utilizing cement/ or cementless (press fit) fixation was discussed. Surgical exposures were discussed with the patient.    The hospitalization and post-operative care and rehabilitation were also discussed. The use of perioperative antibiotics and DVT prophylaxis were discussed. The risk, benefits and alternatives to a surgical intervention were discussed at length with the patient. The patient was also advised of risks related to the medical comorbidities. A lengthy discussion took place to review the most common complications including but not limited to: deep vein thrombosis, pulmonary embolus, heart attack, stroke, infection, wound  breakdown, dislocation, numbness, leg length in-equality, damage to nerves, intraoperative fracture,  tendon,muscles, arteries or other blood vessels, death and other possible complications from anesthesia. The patient was told that we will take steps to minimize these risks by using sterile technique, antibiotics and DVT prophylaxis when appropriate and follow the patient postoperatively in the office setting to monitor progress. The possibility of recurrent pain, no improvement in pain and actual worsening of pain were also discussed with the patient. The risk of dislocation following surgery was discussed and potential precautions to prevent dislocation were reviewed.      The benefits of surgery were discussed with the patient including the potential for improving the patient's current clinical condition through operative intervention. Alternatives to surgical intervention including conservative management were also discussed in detail. All questions were answered to the satisfaction of the patient. The patient participated and agreed to the plan of care as well as the use of the recommended implants for their surgery.    Plan for surgery today N.p.o. for the operating room Hold anticoagulation Plan to recheck potassium shortly after dose of Lokelma  Plan  to the operating room early this afternoon    Hazel Leveille MD  Beeper #:  [phone]  10/31/2022 10:14 AM  

## 2022-10-31 NOTE — Transfer of Care (Signed)
Immediate Anesthesia Transfer of Care Note  Patient: Matthew Zamora  Procedure(s) Performed: ARTHROPLASTY BIPOLAR HIP (HEMIARTHROPLASTY)-RNFA (Right: Hip)  Patient Location: PACU  Anesthesia Type:General  Level of Consciousness: drowsy and patient cooperative  Airway & Oxygen Therapy: Patient Spontanous Breathing and Patient connected to face mask oxygen  Post-op Assessment: Report given to RN  Post vital signs: stable  Last Vitals:  Vitals Value Taken Time  BP    Temp    Pulse    Resp    SpO2      Last Pain:  Vitals:   10/31/22 1210  TempSrc: Oral  PainSc: 9          Complications: No notable events documented.

## 2022-11-01 ENCOUNTER — Encounter: Payer: Self-pay | Admitting: Orthopedic Surgery

## 2022-11-01 DIAGNOSIS — S72001A Fracture of unspecified part of neck of right femur, initial encounter for closed fracture: Secondary | ICD-10-CM | POA: Diagnosis not present

## 2022-11-01 LAB — BPAM RBC
Blood Product Expiration Date: 202405262359
ISSUE DATE / TIME: 202405161443
ISSUE DATE / TIME: 202405161443
Unit Type and Rh: 600
Unit Type and Rh: 600

## 2022-11-01 LAB — TYPE AND SCREEN
Antibody Screen: NEGATIVE
Unit division: 0

## 2022-11-01 LAB — BASIC METABOLIC PANEL
Anion gap: 8 (ref 5–15)
BUN: 18 mg/dL (ref 8–23)
CO2: 20 mmol/L — ABNORMAL LOW (ref 22–32)
Calcium: 8.4 mg/dL — ABNORMAL LOW (ref 8.9–10.3)
Chloride: 106 mmol/L (ref 98–111)
Creatinine, Ser: 1.67 mg/dL — ABNORMAL HIGH (ref 0.61–1.24)
GFR, Estimated: 41 mL/min — ABNORMAL LOW (ref >60)
Glucose, Bld: 89 mg/dL (ref 70–99)
Potassium: 5.3 mmol/L — ABNORMAL HIGH (ref 3.5–5.1)
Sodium: 134 mmol/L — ABNORMAL LOW (ref 135–145)

## 2022-11-01 LAB — GLUCOSE, CAPILLARY
Glucose-Capillary: 93 mg/dL (ref 70–99)
Glucose-Capillary: 89 mg/dL (ref 70–99)

## 2022-11-01 LAB — CBC
HCT: 30.5 % — ABNORMAL LOW (ref 39.0–52.0)
Hemoglobin: 9.6 g/dL — ABNORMAL LOW (ref 13.0–17.0)
MCH: 29.3 pg (ref 26.0–34.0)
MCHC: 31.5 g/dL (ref 30.0–36.0)
MCV: 93 fL (ref 80.0–100.0)
Platelets: 103 10*3/uL — ABNORMAL LOW (ref 150–400)
RBC: 3.28 MIL/uL — ABNORMAL LOW (ref 4.22–5.81)
RDW: 15.3 % (ref 11.5–15.5)
WBC: 11.4 10*3/uL — ABNORMAL HIGH (ref 4.0–10.5)
nRBC: 0 % (ref 0.0–0.2)

## 2022-11-01 NOTE — [place] FL2 (Signed)
[place] MEDICAID FL2 LEVEL OF CARE FORM     IDENTIFICATION  Patient Name: Matthew Zamora Birthdate: 12-13-1940 Sex: male Admission Date (Current Location): 10/30/2022  [facility] and [place] Number:  Chiropodist and Address:  [facility], [address] [place], [place] [address]      Provider Number: [id number]  Attending Physician Name and Address:  Dorcas Carrow, MD  Relative Name and Phone Number:  [name] SOn [phone]    Current Level of Care: Hospital Recommended Level of Care: Skilled Nursing Facility Prior Approval Number:    Date Approved/Denied:   PASRR Number: [id number]  Discharge Plan: SNF    Current Diagnoses: Patient Active Problem List   Diagnosis Date Noted   Closed displaced fracture of right femoral neck ([name]) 10/31/2022   Osteoporosis 10/30/2022   Femoral fracture ([name]) 10/30/2022   Epistaxis 05/12/2022   Protein-calorie malnutrition, severe 05/08/2022   Enteritis, enteropathogenic E. coli 04/29/2022   Diarrhea 04/28/2022   Multifocal pneumonia 04/23/2022   Erosive esophagitis 04/22/2022   Dizziness 04/21/2022   Intractable nausea and vomiting 04/21/2022   Dyspnea 04/20/2022   Hyponatremia 04/19/2022   Orthostatic hypotension 04/18/2022   Postural dizziness with near syncope 04/18/2022   Near syncope 04/17/2022   COPD (chronic obstructive pulmonary disease) ([name]) 04/17/2022   Hyperkalemia 04/17/2022   Abnormal LFTs 04/17/2022   Chronic systolic CHF (congestive heart failure) ([name]) 04/17/2022   Normocytic anemia 04/17/2022   GERD without esophagitis 02/12/2022   Recurrent falls    Generalized weakness    NSVT (nonsustained ventricular tachycardia) ([name]) 02/11/2022   Acute on chronic combined systolic (congestive) and diastolic (congestive) heart failure ([name]) 08/10/2021   Acute respiratory failure with hypoxia ([name])    HCAP (healthcare-associated pneumonia) 06/13/2021   Acute on chronic systolic  CHF (congestive heart failure) ([name]) 06/13/2021   Elevated troponin 06/13/2021   CKD (chronic kidney disease), 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   Lung nodule 06/13/2021   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   COPD exacerbation ([name]) 06/13/2021   Volume depletion 12/15/2019   Hypotension 12/15/2019   Cardiorenal syndrome    Atrial fibrillation with RVR ([name])    Defibrillator discharge    Tachycardia 10/25/2019   CHF (congestive heart failure) ([name]) 09/18/2019   Acute on chronic combined systolic and diastolic CHF (congestive heart failure) ([name]) 09/17/2019   CAD (coronary artery disease) 09/17/2019   Ischemic cardiomyopathy 09/17/2019   CKD (chronic kidney disease) stage 3, GFR 30-59 ml/min ([name]) 09/17/2019   Acute hypoxemic respiratory failure ([name]) 09/17/2018   AAA (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 ([name]) 07/06/2018   Essential hypertension 07/06/2018   Hyperlipidemia 07/06/2018   Paroxysmal atrial fibrillation ([name])    Acute on chronic respiratory failure with hypoxia ([name]) 04/26/2018    Orientation RESPIRATION BLADDER Height & Weight     Self, Time, Situation, Place  Normal, O2 (2 liters) Continent Weight: 61.2 kg Height:  5\' 8"  (172.7 cm)  BEHAVIORAL SYMPTOMS/MOOD NEUROLOGICAL BOWEL NUTRITION STATUS      Continent Diet  AMBULATORY STATUS COMMUNICATION OF NEEDS Skin   Extensive Assist Verbally Normal, Surgical wounds                       Personal Care Assistance Level of Assistance  Bathing, Feeding, Dressing Bathing Assistance: Limited assistance Feeding assistance: Limited assistance Dressing Assistance: Maximum assistance Total Care Assistance: Independent   Functional Limitations Info  Sight, Hearing, Speech Sight Info: Adequate Hearing Info: Adequate Speech Info: Adequate    SPECIAL  CARE FACTORS FREQUENCY  PT (By licensed PT), OT (By licensed OT)     PT Frequency: 5 times per week OT Frequency: 5 times per week            Contractures Contractures Info: Not  present    Additional Factors Info  Code Status, Allergies Code Status Info: full Allergies Info: Nitrofuran Derivatives, Spironolactone           Current Medications (11/01/2022):  This is the current hospital active medication list Current Facility-Administered Medications  Medication Dose Route Frequency Provider Last Rate Last Admin   0.9 %  sodium chloride infusion   Intravenous Continuous Dorcas Carrow, MD 100 mL/hr at 10/31/22 1759 New Bag at 10/31/22 1759   0.9 %  sodium chloride infusion  10 mL/hr Intravenous Once Emeterio Reeve, CRNA       acetaminophen (TYLENOL) tablet 325-650 mg  325-650 mg Oral Q6H PRN Reinaldo Berber, MD       acetaminophen (TYLENOL) tablet 500 mg  500 mg Oral Q6H Reinaldo Berber, MD   500 mg at 11/01/22 1610   albuterol (PROVENTIL) (2.5 MG/3ML) 0.083% nebulizer solution 2.5 mg  2.5 mg Inhalation PRN Nolberto Hanlon, MD       apixaban Everlene Balls) tablet 2.5 mg  2.5 mg Oral BID Reinaldo Berber, MD       aspirin chewable tablet 81 mg  81 mg Oral Q1200 Nolberto Hanlon, MD   81 mg at 11/01/22 1203   atorvastatin (LIPITOR) tablet 80 mg  80 mg Oral Daily Nolberto Hanlon, MD   80 mg at 11/01/22 1028   calcium carbonate (TUMS - dosed in mg elemental calcium) chewable tablet 750 mg  750 mg Oral Daily Nolberto Hanlon, MD   750 mg at 11/01/22 1047   docusate sodium (COLACE) capsule 100 mg  100 mg Oral BID Reinaldo Berber, MD   100 mg at 11/01/22 1028   HYDROcodone-acetaminophen (NORCO) 7.5-325 MG per tablet 1-2 tablet  1-2 tablet Oral Q4H PRN Reinaldo Berber, MD   2 tablet at 11/01/22 1203   HYDROcodone-acetaminophen (NORCO/VICODIN) 5-325 MG per tablet 1-2 tablet  1-2 tablet Oral Q4H PRN Reinaldo Berber, MD       menthol-cetylpyridinium (CEPACOL) lozenge 3 mg  1 lozenge Oral PRN Reinaldo Berber, MD       Or   phenol (CHLORASEPTIC) mouth spray 1 spray  1 spray Mouth/Throat PRN Reinaldo Berber, MD       metoCLOPramide (REGLAN) tablet 5-10 mg  5-10 mg Oral Q8H PRN Reinaldo Berber, MD       Or   metoCLOPramide (REGLAN) injection 5-10 mg  5-10 mg Intravenous Q8H PRN Reinaldo Berber, MD       midodrine (PROAMATINE) tablet 15 mg  15 mg Oral TID WC Nolberto Hanlon, MD   15 mg at 11/01/22 1202   mometasone-formoterol (DULERA) 200-5 MCG/ACT inhaler 2 puff  2 puff Inhalation BID Nolberto Hanlon, MD   2 puff at 11/01/22 0904   morphine (PF) 2 MG/ML injection 0.5-1 mg  0.5-1 mg Intravenous Q2H PRN Reinaldo Berber, MD   1 mg at 11/01/22 0441   ondansetron (ZOFRAN) tablet 4 mg  4 mg Oral Q6H PRN Reinaldo Berber, MD       Or   ondansetron (ZOFRAN) injection 4 mg  4 mg Intravenous Q6H PRN Reinaldo Berber, MD   4 mg at 11/01/22 0440   oxyCODONE (Oxy IR/ROXICODONE) immediate release tablet 5 mg  5 mg Oral Q4H PRN Nolberto Hanlon, MD   5  mg at 11/01/22 0025   oxyCODONE (OXYCONTIN) 12 hr tablet 10 mg  10 mg Oral Q12H Nolberto Hanlon, MD   10 mg at 11/01/22 1037   pantoprazole (PROTONIX) EC tablet 40 mg  40 mg Oral Daily Reinaldo Berber, MD   40 mg at 11/01/22 1027   polyethylene glycol (MIRALAX / GLYCOLAX) packet 17 g  17 g Oral BID Nolberto Hanlon, MD   17 g at 11/01/22 1027   pyridostigmine (MESTINON) tablet 60 mg  60 mg Oral Q8H Nolberto Hanlon, MD   60 mg at 11/01/22 1401   sertraline (ZOLOFT) tablet 25 mg  25 mg Oral Daily Nolberto Hanlon, MD   25 mg at 11/01/22 1028   sodium chloride flush (NS) 0.9 % injection 3 mL  3 mL Intravenous Q12H Nolberto Hanlon, MD   3 mL at 11/01/22 1040   sucralfate (CARAFATE) 1 GM/10ML suspension 1 g  1 g Oral TID WC & HS Nolberto Hanlon, MD   1 g at 11/01/22 1202   tamsulosin (FLOMAX) capsule 0.4 mg  0.4 mg Oral Daily Nolberto Hanlon, MD   0.4 mg at 11/01/22 1027     Discharge Medications: Please see discharge summary for a list of discharge medications.  Relevant Imaging Results:  Relevant Lab Results:   Additional Information SSN: [id number]  [name], RN

## 2022-11-01 NOTE — Progress Notes (Signed)
Pt still not able to urinate at this time. Pt taking fluid intake well. Bladder scanned at 13:17. Pt retained 50 mL fluid. Notified attending MD. Per MD, okay to continue IV fluid overnight until pt urinate.

## 2022-11-01 NOTE — Progress Notes (Signed)
PT Cancellation Note  Patient Details Name: Matthew Zamora MRN: [id number] DOB: [birth year]   Cancelled Treatment:     PT attempt. PT requested to hold PT at this time. Pt is well known by author from previous admission. Currently c/o 9/10 pain in knee and hip.Unable to receive pain meds at this time +  current BP 96/67. PT will return at a later time/date.    Rushie Chestnut 11/01/2022, 2:48 PM
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
## 2022-11-01 NOTE — Progress Notes (Signed)
Occupational Therapy Evaluation Patient Details Name: Matthew Zamora MRN: [id number] DOB: 15-Apr-1941 Today's Date: 11/01/2022   History of Present Illness Patient is 82 year old gentleman with history of COPD on chronic oxygen therapy 2 L, hypertension, hyperlipidemia, paroxysmal A-fib and therapeutic on Eliquis, history of prostate cancer, coronary artery disease fell on the floor while trying to turn right and found to have right hip fracture. S/p R hip hemiarthoplasty 10/31/22.   Clinical Impression   Pt was seen for OT evaluation this date. Prior to hospital admission, pt was living independently with intermittent assistance provided by family for IADLs. Pt presents to acute OT demonstrating impaired ADL performance and functional mobility 2/2 impaired balance sitting; decreased knowledge of use of DME or AE; decreased knowledge of precautions; pain. Pt currently requires MAX A for bed mobility. MOD A for upper body bathing while seated EOB. Transferring was deferred due to patient reporting 10/10 pain. RN in to deliver pain medication at the end of the session. Pt would benefit from skilled OT services to address noted impairments and functional limitations (see below for any additional details) in order to maximize safety and independence while minimizing falls risk and caregiver burden.       Recommendations for follow up therapy are one component of a multi-disciplinary discharge planning process, led by the attending physician.  Recommendations may be updated based on patient status, additional functional criteria and insurance authorization.   Assistance Recommended at Discharge Frequent or constant Supervision/Assistance  Patient can return home with the following Two people to help with walking and/or transfers;Two people to help with bathing/dressing/bathroom;Assistance with cooking/housework;Direct supervision/assist for medications management;Assist for transportation;Help with  stairs or ramp for entrance    Functional Status Assessment  Patient has had a recent decline in their functional status and demonstrates the ability to make significant improvements in function in a reasonable and predictable amount of time.  Equipment Recommendations  Other (comment) (Defer)    Recommendations for Other Services       Precautions / Restrictions Precautions Precautions: Fall;Posterior Hip Restrictions Weight Bearing Restrictions: Yes RLE Weight Bearing: Weight bearing as tolerated      Mobility Bed Mobility Overal bed mobility: Needs Assistance Bed Mobility: Supine to Sit, Sit to Supine     Supine to sit: Mod assist, +2 for physical assistance Sit to supine: Max assist   General bed mobility comments: Movement was limited 2/2 to pain    Transfers                   General transfer comment: Defered due to pain      Balance Overall balance assessment: Needs assistance, History of Falls Sitting-balance support: Bilateral upper extremity supported, Feet supported Sitting balance-Leahy Scale: Poor Sitting balance - Comments: BUE for support to decrease pressure on hips.                                   ADL either performed or assessed with clinical judgement   ADL Overall ADL's : Needs assistance/impaired         Upper Body Bathing: Moderate assistance (Required BUE for  support)                                   Vision         Perception     Praxis  Pertinent Vitals/Pain Pain Assessment Pain Assessment: 0-10 Pain Score: 10-Worst pain ever Pain Location: Rt hip Pain Descriptors / Indicators: Grimacing, Moaning, Sharp, Tender Pain Intervention(s): Patient requesting pain meds-RN notified, RN gave pain meds during session, Repositioned, Relaxation     Hand Dominance Right   Extremity/Trunk Assessment Upper Extremity Assessment Upper Extremity Assessment: Generalized weakness   Lower  Extremity Assessment Lower Extremity Assessment: Defer to PT evaluation       Communication Communication Communication: No difficulties   Cognition Arousal/Alertness: Awake/alert Behavior During Therapy: WFL for tasks assessed/performed Overall Cognitive Status: History of cognitive impairments - at baseline                                 General Comments: memory difficulty     General Comments       Exercises     Shoulder Instructions      Home Living Family/patient expects to be discharged to:: Private residence Living Arrangements: Alone Available Help at Discharge: Family;Available PRN/intermittently Type of Home: Mobile home Home Access: Ramped entrance     Home Layout: One level     Bathroom Shower/Tub: Producer, television/film/video: Standard Bathroom Accessibility: Yes How Accessible: Accessible via walker Home Equipment: Rollator (4 wheels);Rolling Walker (2 wheels);Cane - single point;Wheelchair - manual;Toilet riser   Additional Comments: DIL manages medications, provides transportation and check on patient 2x/day      Prior Functioning/Environment Prior Level of Function : Needs assist  Cognitive Assist : ADLs (cognitive)   ADLs (Cognitive): Set up cues (med management)       Mobility Comments: room to room distances in his mobile home, intermittent RW use; recurrent falls. ADLs Comments: pt reports DIL assists with med management, meals; reports        OT Problem List: Impaired balance (sitting and/or standing);Decreased knowledge of use of DME or AE;Decreased knowledge of precautions;Pain      OT Treatment/Interventions: Self-care/ADL training;Therapeutic exercise;DME and/or AE instruction;Therapeutic activities;Patient/family education    OT Goals(Current goals can be found in the care plan section) Acute Rehab OT Goals Patient Stated Goal: To decrease pain OT Goal Formulation: With patient Time For Goal Achievement:  11/15/22 Potential to Achieve Goals: Good ADL Goals Pt Will Perform Lower Body Bathing: sitting/lateral leans;with min assist Pt Will Perform Lower Body Dressing: with min assist;with caregiver independent in assisting;sit to/from stand Pt Will Transfer to Toilet: with min assist;stand pivot transfer;bedside commode;with +2 assist  OT Frequency: Min 1X/week    Co-evaluation              AM-PAC OT "6 Clicks" Daily Activity     Outcome Measure Help from another person eating meals?: None Help from another person taking care of personal grooming?: A Little Help from another person toileting, which includes using toliet, bedpan, or urinal?: A Lot Help from another person bathing (including washing, rinsing, drying)?: A Lot Help from another person to put on and taking off regular upper body clothing?: A Lot Help from another person to put on and taking off regular lower body clothing?: A Lot 6 Click Score: 15   End of Session Nurse Communication: Patient requests pain meds  Activity Tolerance: Patient tolerated treatment well;Patient limited by pain Patient left: in bed;with call bell/phone within reach;with bed alarm set;with family/visitor present;with nursing/sitter in room  OT Visit Diagnosis: Muscle weakness (generalized) (M62.81);History of falling (Z91.81);Pain  Time: 1610-9604 OT Time Calculation (min): 15 min Charges:  OT General Charges $OT Visit: 1 Visit OT Evaluation $OT Eval Moderate Complexity: 1 Mod  [address], [place]

## 2022-11-01 NOTE — TOC Progression Note (Signed)
Transition of Care [facility]) - Progression Note    Patient Details  Name: Matthew Zamora MRN: [id number] Date of Birth: Jun 28, 1940  Transition of Care [facility]) CM/SW Contact  [name], RN Phone Number: 11/01/2022, 12:42 PM  Clinical Narrative:   Spoke with the patietn and his son Matthew Zamora in the room, they are agreeable to the patient going to rehab and wants the facility [facility], he has been there in the past and had a great experience         Expected Discharge Plan and Services                                               Social Determinants of Health (SDOH) Interventions SDOH Screenings   Food Insecurity: No Food Insecurity (10/30/2022)  Housing: Low Risk  (10/30/2022)  Transportation Needs: No Transportation Needs (10/30/2022)  Utilities: Not At Risk (10/30/2022)  Tobacco Use: Medium Risk (10/30/2022)    Readmission Risk Interventions    04/22/2022    3:54 PM  Readmission Risk Prevention Plan  Transportation Screening Complete  Medication Review (RN Care Manager) Complete  PCP or Specialist appointment within 3-5 days of discharge Complete  HRI or Home Care Consult Complete  SW Recovery Care/Counseling Consult Complete  Palliative Care Screening Not Applicable  Skilled Nursing Facility Not Applicable

## 2022-11-01 NOTE — Progress Notes (Addendum)
Subjective: 1 Day Post-Op Procedure(s) (LRB): ARTHROPLASTY BIPOLAR HIP (HEMIARTHROPLASTY)-RNFA (Right) Patient reports pain as moderate in the right hip.   Patient is  well but did have some N/V last night. PT and care management to assist with D/C planning. Negative for chest pain and shortness of breath Fever: no Gastrointestinal:Negative for nausea and vomiting this morning.  Objective: Vital signs in last 24 hours: Temp:  [97.6 F (36.4 C)-99.2 F (37.3 C)] 99.2 F (37.3 C) (05/17 0434) Pulse Rate:  [93-115] 108 (05/17 0434) Resp:  [10-20] 20 (05/17 0434) BP: (94-138)/(54-89) 138/73 (05/17 0434) SpO2:  [92 %-100 %] 93 % (05/17 0434) Arterial Line BP: (119-131)/(54-62) 119/58 (05/16 1650) Weight:  [61.2 kg] 61.2 kg (05/16 1210)  Intake/Output from previous day:  Intake/Output Summary (Last 24 hours) at 11/01/2022 0712 Last data filed at 11/01/2022 0500 Gross per 24 hour  Intake 3990 ml  Output 1710 ml  Net 2280 ml    Intake/Output this shift: No intake/output data recorded.  Labs: Recent Labs    10/30/22 1938 10/31/22 0611 10/31/22 1354  HGB 10.1* 9.9* 8.8*   Recent Labs    10/31/22 0611 10/31/22 1354  WBC 8.1 7.3  RBC 3.43* 3.02*  HCT 32.3* 28.3*  PLT 179 159   Recent Labs    10/31/22 0611 10/31/22 1046  NA 135 133*  K 5.2* 4.8  CL 101 100  CO2 27 24  BUN 18 19  CREATININE 1.62* 1.62*  GLUCOSE 98 83  CALCIUM 9.1 8.9   Recent Labs    10/31/22 0611  INR 1.2     EXAM General - Patient is Alert, Appropriate, and Oriented Extremity - ABD soft Neurovascular intact Dorsiflexion/Plantar flexion intact Incision: scant drainage No cellulitis present Compartment soft Dressing/Incision - Mild bloody drainage noted to the right hip honeycomb dressing. Motor Function - intact, moving foot and toes well on exam.  Abdomen soft with intact bowel sounds this morning.  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 pylori infection    History of hepatitis    Hyperlipidemia    Hypertension    Liver cyst 05/16/15   PAF (paroxysmal atrial fibrillation) ([name])    Prostate CA ([name])     Assessment/Plan: 1 Day Post-Op Procedure(s) (LRB): ARTHROPLASTY BIPOLAR HIP (HEMIARTHROPLASTY)-RNFA (Right) Principal Problem:   Femoral fracture ([name]) Active Problems:   Hypotension   Osteoporosis   Closed displaced fracture of right femoral neck ([name])  Estimated body mass index is 20.53 kg/m as calculated from the following:   Height as of this encounter: 5\' 8"  (1.727 m).   Weight as of this encounter: 61.2 kg. Advance diet Up with therapy D/C IV fluids when tolerating po intake.  Labs pending this morning. Patient reports he is passing gas this morning. Up with therapy today, patient lives at home by himself but he reports close family support. Care management to assist with d/c planning, will likely need SNF following discharge.  DVT Prophylaxis -  SCDs and Eliquis Weight-Bearing as tolerated to right leg  J. Horris Latino, PA-C [facility] Orthopaedic Surgery 11/01/2022, 7:12 AM   Patient seen and  examined, agree with above plan.  The patient is doing well status post right hip hemiarthroplasty.  Pain is controlled.  Discussed DVT prophylaxis, pain medication use, and safe transition to home.  All questions answered the patient agrees with above plan will see how he does with PT.   Reinaldo Berber MD

## 2022-11-01 NOTE — Progress Notes (Signed)
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  Hemodynamically stable in the emergency room.  Admitted with orthopedic consultation.  Last dose of Eliquis 5/15 afternoon.   Assessment & Plan:   Closed traumatic right femoral fracture: Right hip hemiarthroplasty Dr. Audelia Acton 5/16. Weightbearing as tolerated Adequate pain medications, currently managed with oral pain medication along with bowel regimen. Eliquis 2.5 mg twice daily for DVT prophylaxis, taking before procedure. Refer to a SNF for rehab.  Paroxysmal A-fib: Currently sinus rhythm.  Rate controlled.  Eliquis resumed.  Chronic hypotension, a.m. cortisol adequate.  Patient on midodrine that is continued.  History of coronary artery disease, stable on aspirin and Eliquis.  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  On 2 L oxygen.  Continue bronchodilator therapy.  CKD stage IIIb: History of baseline creatinine about 1.4-1.5.  Developed hyperkalemia.  Tend to be hyperkalemic.  Recheck tomorrow morning.  Not on any potassium replacement.   DVT prophylaxis: apixaban (ELIQUIS) tablet 2.5 mg Start: 11/01/22 2000 SCDs Start: 10/31/22 1723 SCDs Start: 10/30/22 2144 apixaban (ELIQUIS) tablet 2.5 mg   Code Status: Full code Family Communication: None at the bedside. Disposition Plan: Status is: Inpatient Remains inpatient appropriate because: Inpatient surgery planned     Consultants:  Orthopedics  Procedures:  ORIF right hip.  Antimicrobials:  None   Subjective:  Seen in the morning rounds.  Trying to work  with therapy.  Right knee hurts.  No other overnight events.  He had some sinus tachycardia after procedure but now remains fairly stable.  Denies any chest pain or shortness of breath. Agreeable to refer to SNF.  He wants to go to SNF at [facility] where he has been before.  Objective: Vitals:   11/01/22 0434 11/01/22 0817 11/01/22 0900 11/01/22 0914  BP: 138/73 (!) 94/52  101/62  Pulse: (!) 108 (!) 103  (!) 101  Resp: 20 18  15   Temp: 99.2 F (37.3 C) 98.1 F (36.7 C)  98.1 F (36.7 C)  TempSrc:    Oral  SpO2: 93% 98% 90% 91%  Weight:      Height:        Intake/Output Summary (Last 24 hours) at 11/01/2022 1039 Last data filed at 11/01/2022 0500 Gross per 24 hour  Intake 3990 ml  Output 1710 ml  Net 2280 ml    Filed Weights   10/30/22 1912 10/31/22 1210  Weight: 59.9 kg 61.2 kg    Examination:  General exam: Appears calm and comfortable.  Frail.  On room air today. Able to keep up conversation.[name] Respiratory system: No added sounds.  Comfortable on room air. Cardiovascular system: S1 & S2 heard, RRR.  No pedal edema. Gastrointestinal system: Abdomen is nondistended, soft and nontender. No organomegaly or masses felt. Normal bowel sounds heard. Central nervous system: Alert and oriented. No focal neurological deficits. Extremities: Right hip postop, appropriately tender.  Incisions intact.  Distal neurovascular status intact.    Data Reviewed: I have personally reviewed following labs and imaging studies  CBC: Recent Labs  Lab 10/30/22 1938 10/31/22 0611 10/31/22 1354 11/01/22 0910  WBC 8.9 8.1 7.3  11.4*  NEUTROABS 7.4  --   --   --   HGB 10.1* 9.9* 8.8* 9.6*  HCT 32.1* 32.3* 28.3* 30.5*  MCV 93.9 94.2 93.7 93.0  PLT 180 179 159 103*    Basic Metabolic Panel: Recent Labs  Lab 10/30/22 1938 10/31/22 0611 10/31/22 1046 11/01/22 0702  NA 134* 135 133* 134*  K 4.7 5.2* 4.8 5.3*  CL 102 101 100 106  CO2 23 27 24  20*  GLUCOSE 105* 98 83 89  BUN 15 18 19 18    CREATININE 1.49* 1.62* 1.62* 1.67*  CALCIUM 8.9 9.1 8.9 8.4*  PHOS  --  4.1  --   --     GFR: Estimated Creatinine Clearance: 30.5 mL/min (A) (by C-G formula based on SCr of 1.67 mg/dL (H)). Liver Function Tests: Recent Labs  Lab 10/31/22 0611  AST 24  ALT 19  ALKPHOS 71  BILITOT 0.8  PROT 6.5  ALBUMIN 3.6    No results for input(s): "LIPASE", "AMYLASE" in the last 168 hours. No results for input(s): "AMMONIA" in the last 168 hours. Coagulation Profile: Recent Labs  Lab 10/31/22 0611  INR 1.2    Cardiac Enzymes: No results for input(s): "CKTOTAL", "CKMB", "CKMBINDEX", "TROPONINI" in the last 168 hours. BNP (last 3 results) No results for input(s): "PROBNP" in the last 8760 hours. HbA1C: No results for input(s): "HGBA1C" in the last 72 hours. CBG: Recent Labs  Lab 11/01/22 0834  GLUCAP 93   Lipid Profile: No results for input(s): "CHOL", "HDL", "LDLCALC", "TRIG", "CHOLHDL", "LDLDIRECT" in the last 72 hours. Thyroid Function Tests: No results for input(s): "TSH", "T4TOTAL", "FREET4", "T3FREE", "THYROIDAB" in the last 72 hours. Anemia Panel: No results for input(s): "VITAMINB12", "FOLATE", "FERRITIN", "TIBC", "IRON", "RETICCTPCT" in the last 72 hours. Sepsis Labs: No results for input(s): "PROCALCITON", "LATICACIDVEN" in the last 168 hours.  No results found for this or any previous visit (from the past 240 hour(s)).       Radiology Studies: DG Pelvis Portable  Result Date: 10/31/2022 CLINICAL DATA:  Bipolar right hip hemiarthroplasty. EXAM: PORTABLE PELVIS 1-2 VIEWS COMPARISON:  CT right hip 11/02/2022 FINDINGS: Single frontal view of the bilateral hips. Interval bipolar right hip hemiarthroplasty with normal alignment. Expected postoperative subcutaneous air about the right hip. Mild superomedial left femoroacetabular joint space narrowing. Surgical clips overlie the pelvis. No acute fracture or dislocation. IMPRESSION: Interval bipolar right hip  hemiarthroplasty with normal alignment. Electronically Signed   By: Neita Garnet M.D.   On: 10/31/2022 16:23   CT Hip Right Wo Contrast  Result Date: 10/30/2022 CLINICAL DATA:  Right femoral fracture EXAM: CT OF THE RIGHT HIP WITHOUT CONTRAST TECHNIQUE: Multidetector CT imaging of the right hip was performed according to the standard protocol. Multiplanar CT image reconstructions were also generated. RADIATION DOSE REDUCTION: This exam was performed according to the departmental dose-optimization program which includes automated exposure control, adjustment of the mA and/or kV according to patient size and/or use of iterative reconstruction technique. COMPARISON:  Right hips and pelvis radiograph dated 10/30/2022 FINDINGS: Bones/Joint/Cartilage Acute transcervical fracture of the right proximal femur with apex anterior angulation. The right femoroacetabular joint is intact. Ligaments Suboptimally assessed by CT. Muscles and Tendons Grossly intact. Soft tissues Vascular calcifications of the partially imaged right lower extremity arteries. IMPRESSION: Mildly angulated right femoral transcervical fracture. Electronically Signed   By: Agustin Cree M.D.   On: 10/30/2022 20:49   DG Knee 2 Views Right  Result Date: 10/30/2022 CLINICAL DATA:  Right knee pain,  fall EXAM: RIGHT KNEE - 1-2 VIEW COMPARISON:  None Available. FINDINGS: Moderate tricompartment degenerative changes with joint space narrowing and spurring. No joint effusion. No acute bony abnormality. Specifically, no fracture, subluxation, or dislocation. IMPRESSION: Moderate degenerative changes.  No acute bony abnormality. Electronically Signed   By: Charlett Nose M.D.   On: 10/30/2022 20:25   DG Hip Unilat W or Wo Pelvis 2-3 Views Right  Result Date: 10/30/2022 CLINICAL DATA:  Fall, right hip pain EXAM: DG HIP (WITH OR WITHOUT PELVIS) 2-3V RIGHT COMPARISON:  None Available. FINDINGS: There is foreshortening of the right femoral neck with cortical  irregularity noted laterally concerning for right femoral neck fracture. No subluxation or dislocation. Mild symmetric degenerative changes in the hips with joint space narrowing and spurring. IMPRESSION: Foreshortening of the right femoral neck with cortical irregularity laterally concerning for femoral neck fracture. Electronically Signed   By: Charlett Nose M.D.   On: 10/30/2022 20:24   DG Chest Portable 1 View  Result Date: 10/30/2022 CLINICAL DATA:  Fall, right hip pain EXAM: PORTABLE CHEST 1 VIEW COMPARISON:  09/14/2022 FINDINGS: Left AICD remains in place, unchanged. Heart is normal size. Mediastinal contours within normal limits. No confluent opacities or effusions. No acute bony abnormality. IMPRESSION: No active disease. Electronically Signed   By: Charlett Nose M.D.   On: 10/30/2022 20:22   CT HEAD WO CONTRAST ( )  Result Date: 10/30/2022 CLINICAL DATA:  Trip and fall injury. EXAM: CT HEAD WITHOUT CONTRAST CT CERVICAL SPINE WITHOUT CONTRAST TECHNIQUE: Multidetector CT imaging of the head and cervical spine was performed following the standard protocol without intravenous contrast. Multiplanar CT image reconstructions of the cervical spine were also generated. RADIATION DOSE REDUCTION: This exam was performed according to the departmental dose-optimization program which includes automated exposure control, adjustment of the mA and/or kV according to patient size and/or use of iterative reconstruction technique. COMPARISON:  Head CT without contrast 04/17/2022, cervical spine CT 03/27/2022. FINDINGS: CT HEAD FINDINGS Brain: There is mild atrophy, small-vessel disease and atrophic ventriculomegaly. No new asymmetry is seen concerning for an acute infarct, hemorrhage or mass. There is no midline shift. The basal cisterns are clear. Vascular: There are patchy calcifications of the carotid siphons but no hyperdense central vessels. Skull: No fracture or focal lesions.  Mild calvarial osteopenia.  Sinuses/Orbits: No acute findings. Clear visualized sinuses and mastoid air cells. Negative orbits apart from old lens replacements. Other: None. CT CERVICAL SPINE FINDINGS Alignment: Unchanged. There is a trace 2 mm C3-4 retrolisthesis, similar minimal C4-5 anterolisthesis and trace C5-6 and C6-7 retrolisthesis all believed degenerative and all unchanged. No traumatic listhesis is seen. There is bone-on-bone anterior atlantodental joint space loss with osteophytes, and degenerative cystic changes in the odontoid process. Skull base and vertebrae: No acute fracture is evident. Bone mineralization is osteopenic. No primary bone lesion or focal pathologic process is seen. Soft tissues and spinal canal: No prevertebral fluid or swelling. No visible canal hematoma. There are calcifications in both proximal cervical ICAs, heaviest on the left where there is probably a flow-limiting origin stenosis. No laryngeal or thyroid mass. Disc levels: There is moderate disc space loss again at C3-4, C5-6 and C6-7, mild disc narrowing at C3-4, normal disc heights at C2-3 and C7-T1. There are small bidirectional endplate spurs from C3-4 through C6-7, causing partial effacement of the ventral CSF without spondylotic cord compression. Facet joint spurring on the left-greater-than-right is seen at most levels with bilateral uncinate spurring. Acquired foraminal stenosis is mild on the  right at C2-3, severe on the left and moderate on the right at C3-4, bilaterally mild-to-moderate C6-7, and not seen at the remaining levels. Upper chest: Pacemaker wiring is partially visible entering from the left. Otherwise negative. Other: None. IMPRESSION: 1. No acute intracranial CT findings or depressed skull fractures. 2. Osteopenia and degenerative change without evidence of cervical fractures or traumatic listhesis. 3. 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 Follow-up as indicated.  Electronically Signed   By: Almira Bar M.D.   On: 10/30/2022 20:15   CT Cervical Spine Wo Contrast  Result Date: 10/30/2022 CLINICAL DATA:  Trip and fall injury. EXAM: CT HEAD WITHOUT CONTRAST CT CERVICAL SPINE WITHOUT CONTRAST TECHNIQUE: Multidetector CT imaging of the head and cervical spine was performed following the standard protocol without intravenous contrast. Multiplanar CT image reconstructions of the cervical spine were also generated. RADIATION DOSE REDUCTION: This exam was performed according to the departmental dose-optimization program which includes automated exposure control, adjustment of the mA and/or kV according to patient size and/or use of iterative reconstruction technique. COMPARISON:  Head CT without contrast 04/17/2022, cervical spine CT 03/27/2022. FINDINGS: CT HEAD FINDINGS Brain: There is mild atrophy, small-vessel disease and atrophic ventriculomegaly. No new asymmetry is seen concerning for an acute infarct, hemorrhage or mass. There is no midline shift. The basal cisterns are clear. Vascular: There are patchy calcifications of the carotid siphons but no hyperdense central vessels. Skull: No fracture or focal lesions.  Mild calvarial osteopenia. Sinuses/Orbits: No acute findings. Clear visualized sinuses and mastoid air cells. Negative orbits apart from old lens replacements. Other: None. CT CERVICAL SPINE FINDINGS Alignment: Unchanged. There is a trace 2 mm C3-4 retrolisthesis, similar minimal C4-5 anterolisthesis and trace C5-6 and C6-7 retrolisthesis all believed degenerative and all unchanged. No traumatic listhesis is seen. There is bone-on-bone anterior atlantodental joint space loss with osteophytes, and degenerative cystic changes in the odontoid process. Skull base and vertebrae: No acute fracture is evident. Bone mineralization is osteopenic. No primary bone lesion or focal pathologic process is seen. Soft tissues and spinal canal: No prevertebral fluid or swelling.  No visible canal hematoma. There are calcifications in both proximal cervical ICAs, heaviest on the left where there is probably a flow-limiting origin stenosis. No laryngeal or thyroid mass. Disc levels: There is moderate disc space loss again at C3-4, C5-6 and C6-7, mild disc narrowing at C3-4, normal disc heights at C2-3 and C7-T1. There are small bidirectional endplate spurs from C3-4 through C6-7, causing partial effacement of the ventral CSF without spondylotic cord compression. Facet joint spurring on the left-greater-than-right is seen at most levels with bilateral uncinate spurring. Acquired foraminal stenosis is mild on the right at C2-3, severe on the left and moderate on the right at C3-4, bilaterally mild-to-moderate C6-7, and not seen at the remaining levels. Upper chest: Pacemaker wiring is partially visible entering from the left. Otherwise negative. Other: None. IMPRESSION: 1. No acute intracranial CT findings or depressed skull fractures. 2. Osteopenia and degenerative change without evidence of cervical fractures or traumatic listhesis. 3. 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 Follow-up as indicated. Electronically Signed   By: Almira Bar M.D.   On: 10/30/2022 20:15        Scheduled Meds:  acetaminophen  500 mg Oral Q6H   apixaban  2.5 mg Oral BID   aspirin  81 mg Oral Q1200   atorvastatin  80 mg Oral Daily   calcium carbonate  750 mg Oral Daily   docusate sodium  100 mg Oral BID   midodrine  15 mg Oral TID WC   mometasone-formoterol  2 puff Inhalation BID   oxyCODONE  10 mg Oral Q12H   pantoprazole  40 mg Oral Daily   polyethylene glycol  17 g Oral BID   pyridostigmine  60 mg Oral Q8H   sertraline  25 mg Oral Daily   sodium chloride flush  3 mL Intravenous Q12H   sucralfate  1 g Oral TID WC & HS   tamsulosin  0.4 mg Oral Daily   Continuous Infusions:  sodium chloride 100 mL/hr at 10/31/22 1759   sodium chloride        LOS: 2 days    Time spent: 35 minutes    Dorcas Carrow, MD [facility] Pager [phone]

## 2022-11-02 DIAGNOSIS — E861 Hypovolemia: Secondary | ICD-10-CM | POA: Diagnosis not present

## 2022-11-02 DIAGNOSIS — S72001A Fracture of unspecified part of neck of right femur, initial encounter for closed fracture: Secondary | ICD-10-CM | POA: Diagnosis not present

## 2022-11-02 LAB — BASIC METABOLIC PANEL
Anion gap: 5 (ref 5–15)
BUN: 20 mg/dL (ref 8–23)
CO2: 22 mmol/L (ref 22–32)
Calcium: 8.4 mg/dL — ABNORMAL LOW (ref 8.9–10.3)
Chloride: 105 mmol/L (ref 98–111)
Creatinine, Ser: 1.72 mg/dL — ABNORMAL HIGH (ref 0.61–1.24)
GFR, Estimated: 40 mL/min — ABNORMAL LOW (ref >60)
Glucose, Bld: 85 mg/dL (ref 70–99)
Potassium: 4.4 mmol/L (ref 3.5–5.1)
Sodium: 132 mmol/L — ABNORMAL LOW (ref 135–145)

## 2022-11-02 LAB — CBC
HCT: 27.1 % — ABNORMAL LOW (ref 39.0–52.0)
Hemoglobin: 8.7 g/dL — ABNORMAL LOW (ref 13.0–17.0)
MCH: 29.3 pg (ref 26.0–34.0)
MCHC: 32.1 g/dL (ref 30.0–36.0)
MCV: 91.2 fL (ref 80.0–100.0)
Platelets: 107 10*3/uL — ABNORMAL LOW (ref 150–400)
RBC: 2.97 MIL/uL — ABNORMAL LOW (ref 4.22–5.81)
RDW: 14.6 % (ref 11.5–15.5)
WBC: 8.2 10*3/uL (ref 4.0–10.5)
nRBC: 0 % (ref 0.0–0.2)

## 2022-11-02 LAB — THYROID PANEL WITH TSH
Free Thyroxine Index: 3.7 (ref 1.2–4.9)
T3 Uptake Ratio: 34 % (ref 24–39)
T4, Total: 11 ug/dL (ref 4.5–12.0)
TSH: 3.75 u[IU]/mL (ref 0.450–4.500)

## 2022-11-02 MED ORDER — HYDROCODONE-ACETAMINOPHEN 5-325 MG PO TABS: 1.0000 | ORAL_TABLET | ORAL | 0 refills | Status: DC | PRN

## 2022-11-02 NOTE — TOC Progression Note (Signed)
Transition of Care [facility]) - Progression Note    Patient Details  Name: Matthew Zamora MRN: [id number] Date of Birth: 1941/05/05  [facility]) CM/SW Contact  Liliana Cline, LCSW Phone Number: 11/02/2022, 9:55 AM  Clinical Narrative:    Patient has a bed offer at his preferred SNF ([facility]). Left VM for son [name] with update. Updated Ricky at [employer] and DO. Plan for DC to [facility] on Monday. Will start auth tomorrow.         Expected Discharge Plan and Services                                               Social Determinants of Health (SDOH) Interventions SDOH Screenings   Food Insecurity: No Food Insecurity (10/30/2022)  Housing: Low Risk  (10/30/2022)  Transportation Needs: No Transportation Needs (10/30/2022)  Utilities: Not At Risk (10/30/2022)  Tobacco Use: Medium Risk (11/01/2022)    Readmission Risk Interventions    04/22/2022    3:54 PM  Readmission Risk Prevention Plan  Transportation Screening Complete  Medication Review (RN Care Manager) Complete  PCP or Specialist appointment within 3-5 days of discharge Complete  HRI or Home Care Consult Complete  SW Recovery Care/Counseling Consult Complete  Palliative Care Screening Not Applicable  Skilled Nursing Facility Not Applicable

## 2022-11-02 NOTE — Progress Notes (Signed)
PT Cancellation Note  Patient Details Name: Matthew Zamora MRN: [id number] DOB: 30-Jun-1940   Cancelled Treatment:    Reason Eval/Treat Not Completed: Patient declined, no reason specified Nursing was performing hygiene and PT was offered to pt afterwards and pt declined and seemed to be having a lot of trouble with pain.   Hortencia Conradi, PTA  11/02/22, 3:55 PM

## 2022-11-02 NOTE — Plan of Care (Signed)
  Problem: Clinical Measurements: Goal: Ability to maintain clinical measurements within normal limits will improve Outcome: Progressing Goal: Will remain free from infection Outcome: Progressing   Problem: Activity: Goal: Risk for activity intolerance will decrease Outcome: Progressing   Problem: Safety: Goal: Ability to remain free from injury will improve Outcome: Progressing   Problem: Skin Integrity: Goal: Risk for impaired skin integrity will decrease Outcome: Progressing   Problem: Activity: Goal: Ability to ambulate and perform ADLs will improve Outcome: Progressing   Problem: Pain Management: Goal: Pain level will decrease Outcome: Progressing

## 2022-11-02 NOTE — Progress Notes (Signed)
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 S/p R hip hemiarthoplasty 10/31/22.    PT Comments    Skilled session focused on educating and encouraging pt to participate in PT, benefits of PT, and attempting to encourage pt to allow this PTA to assist him with LE ROM, bed mobility, and to sit EOB.  Pt would allow attempts to assist with mobility but then decline 2/2 R LE pain.  Pt did allow for very limited AAROM in bil. LEs but would quickly decline any attempts to further assist towards more functional ROM. Pt would benefit from skilled PT services to address noted impairments and functional limitations (see below for any additional details) in order to maximize safety and independence while minimizing falls risk and caregiver burden.    Recommendations for follow up therapy are one component of a multi-disciplinary discharge planning process, led by the attending physician.  Recommendations may be updated based on patient status, additional functional criteria and insurance authorization.  Follow Up Recommendations  Can patient physically be transported by private vehicle: No    Assistance Recommended at Discharge Intermittent Supervision/Assistance  Patient can return home with the following Two people to help with walking and/or transfers;Two people to help with bathing/dressing/bathroom;Help with stairs or ramp for entrance   Equipment Recommendations  None recommended by PT    Recommendations for Other Services       Precautions / Restrictions Precautions Precautions: Fall;Posterior  Hip Precaution Booklet Issued: No Restrictions Weight Bearing Restrictions: Yes RLE Weight Bearing: Weight bearing as tolerated     Mobility  Bed Mobility               General bed mobility comments: When attempted, pt declined reporting RLE pain. Patient Response: Anxious  Transfers                   General transfer comment: When attempted, pt declined reporting RLE pain.    Ambulation/Gait                   Stairs             Wheelchair Mobility    Modified Rankin (Stroke Patients Only)       Balance       Sitting balance - Comments: When attempted, pt declined reporting RLE pain.                                    Cognition Arousal/Alertness: Awake/alert Behavior During Therapy: WFL for tasks assessed/performed Overall Cognitive Status: History of cognitive impairments - at baseline                                 General Comments: memory difficulty        Exercises Total Joint Exercises Ankle Circles/Pumps: AROM, 5 reps, Right, Supine, Left, AAROM Quad Sets: AROM, Both, 5 reps Heel Slides: AAROM, Both, 5 reps Heel Slides Limitations: 25% ROM tolerated.    General Comments        Pertinent  Vitals/Pain Pain Assessment Pain Assessment: Faces Faces Pain Scale: Hurts whole lot Pain Location: Rt hip Pain Descriptors / Indicators: Grimacing, Moaning, Sharp, Tender Pain Intervention(s): Limited activity within patient's tolerance, Monitored during session    Home Living                          Prior Function            PT Goals (current goals can now be found in the care plan section) Acute Rehab PT Goals Patient Stated Goal: return to walking, improve pain, get stronger PT Goal Formulation: With patient Time For Goal Achievement: 11/15/22 Potential to Achieve Goals: Good Progress towards PT goals: Progressing toward goals    Frequency    BID      PT Plan Current  plan remains appropriate    Co-evaluation              AM-PAC PT "6 Clicks" Mobility   Outcome Measure  Help needed turning from your back to your side while in a flat bed without using bedrails?: Total Help needed moving from lying on your back to sitting on the side of a flat bed without using bedrails?: Total Help needed moving to and from a bed to a chair (including a wheelchair)?: Total Help needed standing up from a chair using your arms (e.g., wheelchair or bedside chair)?: Total Help needed to walk in hospital room?: Total Help needed climbing 3-5 steps with a railing? : Total 6 Click Score: 6    End of Session   Activity Tolerance: Patient limited by pain Patient left: in bed;with call bell/phone within reach;with bed alarm set Nurse Communication: Mobility status PT Visit Diagnosis: Unsteadiness on feet (R26.81);Difficulty in walking, not elsewhere classified (R26.2);Other abnormalities of gait and mobility (R26.89)     Time: 1126-1150 PT Time Calculation (min) (ACUTE ONLY): 24 min  Charges:  $Therapeutic Activity: 23-37 mins                     Hortencia Conradi, PTA  11/02/22, 12:13 PM

## 2022-11-02 NOTE — Progress Notes (Signed)
  Subjective: 2 Days Post-Op Procedure(s) (LRB): ARTHROPLASTY BIPOLAR HIP (HEMIARTHROPLASTY)-RNFA (Right) Patient reports pain as moderate in the right hip.   Patient is  well but did have some N/V last night. PT and care management to assist with D/C planning. Negative for chest pain and shortness of breath Fever: no Gastrointestinal:Negative for nausea and vomiting this morning.  Objective: Vital signs in last 24 hours: Temp:  [97.2 F (36.2 C)-98.2 F (36.8 C)] 97.8 F (36.6 C) (05/17 1704) Pulse Rate:  [94-107] 102 (05/17 2014) Resp:  [15-18] 18 (05/18 0004) BP: (89-121)/(52-73) 112/69 (05/18 0004) SpO2:  [90 %-99 %] 98 % (05/18 0004)  Intake/Output from previous day:  Intake/Output Summary (Last 24 hours) at 11/02/2022 0733 Last data filed at 11/02/2022 0500 Gross per 24 hour  Intake --  Output 301 ml  Net -301 ml    Intake/Output this shift: No intake/output data recorded.  Labs: Recent Labs    10/30/22 1938 10/31/22 0611 10/31/22 1354 11/01/22 0910 11/02/22 0456  HGB 10.1* 9.9* 8.8* 9.6* 8.7*   Recent Labs    11/01/22 0910 11/02/22 0456  WBC 11.4* 8.2  RBC 3.28* 2.97*  HCT 30.5* 27.1*  PLT 103* 107*   Recent Labs    11/01/22 0702 11/02/22 0456  NA 134* 132*  K 5.3* 4.4  CL 106 105  CO2 20* 22  BUN 18 20  CREATININE 1.67* 1.72*  GLUCOSE 89 85  CALCIUM 8.4* 8.4*   Recent Labs    10/31/22 0611  INR 1.2     EXAM General - Patient is Alert, Appropriate, and Oriented Extremity - ABD soft Neurovascular intact Dorsiflexion/Plantar flexion intact Incision: scant drainage No cellulitis present Compartment soft Dressing/Incision -no drainage noted to the right hip honeycomb dressing. Motor Function - intact, moving foot and toes well on exam.  Abdomen soft with intact bowel sounds this morning.  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 pylori infection    History of hepatitis    Hyperlipidemia    Hypertension    Liver cyst 05/16/15   PAF (paroxysmal atrial fibrillation) ([name])    Prostate CA ([name])     Assessment/Plan: 2 Days Post-Op Procedure(s) (LRB): ARTHROPLASTY BIPOLAR HIP (HEMIARTHROPLASTY)-RNFA (Right) Principal Problem:   Femoral fracture ([name]) Active Problems:   Hypotension   Osteoporosis   Closed displaced fracture of right femoral neck ([name])  Estimated body mass index is 20.53 kg/m as calculated from the following:   Height as of this encounter: 5\' 8"  (1.727 m).   Weight as of this encounter: 61.2 kg. Advance diet Up with therapy D/C IV fluids when tolerating po intake.  Acute blood loss anemia status post hip fracture.  Hemoglobin 8 .7 and stable Up with therapy today, patient lives at home by himself but he reports close family support. Care management to assist with d/c planning, will likely need SNF following discharge.  DVT Prophylaxis -  SCDs and Eliquis Weight-Bearing as tolerated to right leg  Dedra Skeens PA-C [facility] Orthopaedic Surgery 11/02/2022, 7:33 AM

## 2022-11-02 NOTE — Discharge Instructions (Signed)
POSTERIOR TOTAL HIP REPLACEMENT POSTOPERATIVE DIRECTIONS  Hip Rehabilitation, Guidelines Following Surgery  The results of a hip operation are greatly improved after range of motion and muscle strengthening exercises. Follow all safety measures which are given to protect your hip. If any of these exercises cause increased pain or swelling in your joint, decrease the amount until you are comfortable again. Then slowly increase the exercises. Call your caregiver if you have problems or questions.   HOME CARE INSTRUCTIONS  Remove items at home which could result in a fall. This includes throw rugs or furniture in walking pathways.  ICE to the affected hip every three hours for 30 minutes at a time and then as needed for pain and swelling.  Continue to use ice on the hip for pain and swelling from surgery. You may notice swelling that will progress down to the foot and ankle.  This is normal after surgery.  Elevate the leg when you are not up walking on it.   Continue to use the breathing machine which will help keep your temperature down.  It is common for your temperature to cycle up and down following surgery, especially at night when you are not up moving around and exerting yourself.  The breathing machine keeps your lungs expanded and your temperature down.  DIET You may resume your previous home diet once your are discharged from the hospital.  DRESSING / WOUND CARE / SHOWERING You may change your dressing 3-5 days after surgery.  Then change the dressing every day with sterile gauze.  Please use good hand washing techniques before changing the dressing.  Do not use any lotions or creams on the incision until instructed by your surgeon. You need to keep your dressing dry after discharge.   Change the surgical dressing if needed with Physical Therapy and reapply a dry dressing each time.    ACTIVITY Walk with your walker as instructed. Use walker as long as suggested by your  caregivers. Avoid periods of inactivity such as sitting longer than an hour when not asleep. This helps prevent blood clots.  You may resume a sexual relationship in one month or when given the OK by your doctor.  You may return to work once you are cleared by your doctor.  Do not drive a car for 6 weeks or until released by you surgeon.  Do not drive while taking narcotics.  WEIGHT BEARING Weightbearing as tolerated on the right hip.  Platform walker with nonweightbearing on the right wrist.  Can weight-bear through her right elbow.  POSTOPERATIVE CONSTIPATION PROTOCOL Constipation - defined medically as fewer than three stools per week and severe constipation as less than one stool per week.  One of the most common issues patients have following surgery is constipation.  Even if you have a regular bowel pattern at home, your normal regimen is likely to be disrupted due to multiple reasons following surgery.  Combination of anesthesia, postoperative narcotics, change in appetite and fluid intake all can affect your bowels.  In order to avoid complications following surgery, here are some recommendations in order to help you during your recovery period.  Colace (docusate) - Pick up an over-the-counter form of Colace or another stool softener and take twice a day as long as you are requiring postoperative pain medications.  Take with a full glass of water daily.  If you experience loose stools or diarrhea, hold the colace until you stool forms back up.  If your symptoms do not get   better within 1 week or if they get worse, check with your doctor.  Dulcolax (bisacodyl) - Pick up over-the-counter and take as directed by the product packaging as needed to assist with the movement of your bowels.  Take with a full glass of water.  Use this product as needed if not relieved by Colace only.   MiraLax (polyethylene glycol) - Pick up over-the-counter to have on hand.  MiraLax is a solution that will  increase the amount of water in your bowels to assist with bowel movements.  Take as directed and can mix with a glass of water, juice, soda, coffee, or tea.  Take if you go more than two days without a movement. Do not use MiraLax more than once per day. Call your doctor if you are still constipated or irregular after using this medication for 7 days in a row.  If you continue to have problems with postoperative constipation, please contact the office for further assistance and recommendations.  If you experience "the worst abdominal pain ever" or develop nausea or vomiting, please contact the office immediatly for further recommendations for treatment.  ITCHING  If you experience itching with your medications, try taking only a single pain pill, or even half a pain pill at a time.  You can also use Benadryl over the counter for itching or also to help with sleep.   TED HOSE STOCKINGS Wear the elastic stockings on both legs for three weeks following surgery during the day but you may remove then at night for sleeping.  MEDICATIONS See your medication summary on the "After Visit Summary" that the nursing staff will review with you prior to discharge.  You may have some home medications which will be placed on hold until you complete the course of blood thinner medication.  It is important for you to complete the blood thinner medication as prescribed by your surgeon.  Continue your approved medications as instructed at time of discharge.  PRECAUTIONS If you experience chest pain or shortness of breath - call 911 immediately for transfer to the hospital emergency department.  If you develop a fever greater that 101 F, purulent drainage from wound, increased redness or drainage from wound, foul odor from the wound/dressing, or calf pain - CONTACT YOUR SURGEON.                                                   FOLLOW-UP APPOINTMENTS Make sure you keep all of your appointments after your operation  with your surgeon and caregivers. You should call the office at the above phone number and make an appointment for approximately two weeks after the date of your surgery or on the date instructed by your surgeon outlined in the "After Visit Summary".  RANGE OF MOTION AND STRENGTHENING EXERCISES  These exercises are designed to help you keep full movement of your hip joint. Follow your caregiver's or physical therapist's instructions. Perform all exercises about fifteen times, three times per day or as directed. Exercise both hips, even if you have had only one joint replacement. These exercises can be done on a training (exercise) mat, on the floor, on a table or on a bed. Use whatever works the best and is most comfortable for you. Use music or television while you are exercising so that the exercises are a pleasant break in   your day. This will make your life better with the exercises acting as a break in routine you can look forward to.  Lying on your back, slowly slide your foot toward your buttocks, raising your knee up off the floor. Then slowly slide your foot back down until your leg is straight again.  Lying on your back spread your legs as far apart as you can without causing discomfort.  Lying on your side, raise your upper leg and foot straight up from the floor as far as is comfortable. Slowly lower the leg and repeat.  Lying on your back, tighten up the muscle in the front of your thigh (quadriceps muscles). You can do this by keeping your leg straight and trying to raise your heel off the floor. This helps strengthen the largest muscle supporting your knee.  Lying on your back, tighten up the muscles of your buttocks both with the legs straight and with the knee bent at a comfortable angle while keeping your heel on the floor.      IF YOU ARE TRANSFERRED TO A SKILLED REHAB FACILITY If the patient is transferred to a skilled rehab facility following release from the hospital, a list of the  current medications will be sent to the facility for the patient to continue.  When discharged from the skilled rehab facility, please have the facility set up the patient's Home Health Physical Therapy prior to being released. Also, the skilled facility will be responsible for providing the patient with their medications at time of release from the facility to include their pain medication, the muscle relaxants, and their blood thinner medication. If the patient is still at the rehab facility at time of the two week follow up appointment, the skilled rehab facility will also need to assist the patient in arranging follow up appointment in our office and any transportation needs.  MAKE SURE YOU:  Understand these instructions.  Get help right away if you are not doing well or get worse.    Pick up stool softner and laxative for home use following surgery while on pain medications. Do not submerge incision under water. Please use good hand washing techniques while changing dressing each day. May shower starting three days after surgery. Please use a clean towel to pat the incision dry following showers. Continue to use ice for pain and swelling after surgery. Do not use any lotions or creams on the incision until instructed by your surgeon.  

## 2022-11-02 NOTE — Progress Notes (Signed)
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  Hemodynamically stable in the emergency room.  Admitted with orthopedic consultation.  Last dose of Eliquis 5/15 afternoon.   5/16 - underwent Right hip hemiarthroplasty  Assessment & Plan:   Closed traumatic right femoral fracture: Right hip hemiarthroplasty Dr. Audelia Acton 5/16. Weightbearing as tolerated Adequate pain medications, currently managed with oral pain medication along with bowel regimen. Eliquis 2.5 mg twice daily for DVT prophylaxis, taking before procedure. Awaiting SNF placement for rehab.  Paroxysmal A-fib: Currently sinus rhythm.  Rate controlled.  Eliquis resumed.  Chronic hypotension, a.m. cortisol adequate.  Patient on midodrine that is continued.  History of coronary artery disease, stable on aspirin and Eliquis.  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  On 2 L oxygen.  Continue bronchodilator therapy.  CKD stage IIIb: History of baseline creatinine about 1.4-1.5.  Developed hyperkalemia.  Tend to be hyperkalemic.  Recheck tomorrow morning.  Not on any potassium replacement.   DVT prophylaxis: apixaban (ELIQUIS) tablet 2.5 mg Start: 11/01/22 2000 SCDs Start: 10/31/22 1723 SCDs Start: 10/30/22 2144 apixaban (ELIQUIS) tablet 2.5 mg   Code Status: Full code Family Communication: None at the bedside.  Disposition Plan:  SNF  Status is: Inpatient Remains inpatient appropriate because: Inpatient surgery planned     Consultants:  Orthopedics  Procedures:  ORIF right hip.  Antimicrobials:   None   Subjective:  Pt awake sitting up in bed this AM.  He is confused and reports pretty severe hip pain, awaiting nurse to bring medication.  He otherwise denies any acute complaints.    Objective: Vitals:   11/01/22 2014 11/02/22 0004 11/02/22 0803 11/02/22 1225  BP: 121/73 112/69 107/67 126/69  Pulse: (!) 102  (!) 112 (!) 114  Resp:  18 17 18   Temp:   98.4 F (36.9 C) (!) 97.4 F (36.3 C)  TempSrc:    Oral  SpO2: 97% 98% 98% 96%  Weight:      Height:        Intake/Output Summary (Last 24 hours) at 11/02/2022 1346 Last data filed at 11/02/2022 0500 Gross per 24 hour  Intake --  Output 301 ml  Net -301 ml   Filed Weights   10/30/22 1912 10/31/22 1210  Weight: 59.9 kg 61.2 kg    Examination:  General exam: awake, alert, no acute distress, frail, confused HEENT: atraumatic, clear conjunctiva, anicteric sclera, moist mucus membranes, hearing grossly normal  Respiratory system: CTAB, no wheezes, rales or rhonchi, normal respiratory effort. Cardiovascular system: normal S1/S2, RRR, intact pedal pulses.   Gastrointestinal system: soft, NT, ND, no HSM felt, +bowel sounds. Central nervous system: A&O x 2. no gross focal neurologic deficits, normal speech Skin: dry, intact, normal temperature Psychiatry: normal mood, congruent affect, confused     Data Reviewed: I have personally reviewed following labs and imaging studies  CBC: Recent Labs  Lab 10/30/22 1938 10/31/22 0611 10/31/22 1354 11/01/22 0910 11/02/22 0456  WBC 8.9 8.1 7.3 11.4* 8.2  NEUTROABS 7.4  --   --   --   --   HGB 10.1* 9.9* 8.8* 9.6* 8.7*  HCT 32.1* 32.3* 28.3* 30.5* 27.1*  MCV 93.9 94.2 93.7 93.0 91.2  PLT 180 179 159 103* 107*   Basic Metabolic Panel: Recent Labs  Lab 10/30/22 1938 10/31/22 0611 10/31/22 1046 11/01/22 0702 11/02/22 0456  NA 134* 135 133* 134* 132*  K 4.7 5.2* 4.8 5.3* 4.4  CL 102 101 100 106 105  CO2 23 27 24  20* 22  GLUCOSE 105* 98 83 89 85  BUN 15 18 19 18  20   CREATININE 1.49* 1.62* 1.62* 1.67* 1.72*  CALCIUM 8.9 9.1 8.9 8.4* 8.4*  PHOS  --  4.1  --   --   --    GFR: Estimated Creatinine Clearance: 29.7 mL/min (A) (by C-G formula based on SCr of 1.72 mg/dL (H)). Liver Function Tests: Recent Labs  Lab 10/31/22 0611  AST 24  ALT 19  ALKPHOS 71  BILITOT 0.8  PROT 6.5  ALBUMIN 3.6   No results for input(s): "LIPASE", "AMYLASE" in the last 168 hours. No results for input(s): "AMMONIA" in the last 168 hours. Coagulation Profile: Recent Labs  Lab 10/31/22 0611  INR 1.2   Cardiac Enzymes: No results for input(s): "CKTOTAL", "CKMB", "CKMBINDEX", "TROPONINI" in the last 168 hours. BNP (last 3 results) No results for input(s): "PROBNP" in the last 8760 hours. HbA1C: No results for input(s): "HGBA1C" in the last 72 hours. CBG: Recent Labs  Lab 11/01/22 0834 11/01/22 1628  GLUCAP 93 89   Lipid Profile: No results for input(s): "CHOL", "HDL", "LDLCALC", "TRIG", "CHOLHDL", "LDLDIRECT" in the last 72 hours. Thyroid Function Tests: Recent Labs    10/31/22 0611  TSH 3.750  T4TOTAL 11.0   Anemia Panel: No results for input(s): "VITAMINB12", "FOLATE", "FERRITIN", "TIBC", "IRON", "RETICCTPCT" in the last 72 hours. Sepsis Labs: No results for input(s): "PROCALCITON", "LATICACIDVEN" in the last 168 hours.  No results found for this or any previous visit (from the past 240 hour(s)).       Radiology Studies: DG Pelvis Portable  Result Date: 10/31/2022 CLINICAL DATA:  Bipolar right hip hemiarthroplasty. EXAM: PORTABLE PELVIS 1-2 VIEWS COMPARISON:  CT right hip 11/02/2022 FINDINGS: Single frontal view of the bilateral hips. Interval bipolar right hip hemiarthroplasty with normal alignment. Expected postoperative subcutaneous air about the right hip. Mild superomedial left femoroacetabular joint space narrowing. Surgical clips overlie the pelvis. No acute fracture or dislocation. IMPRESSION: Interval bipolar right hip hemiarthroplasty  with normal alignment. Electronically Signed   By: Neita Garnet M.D.   On: 10/31/2022 16:23        Scheduled Meds:  apixaban  2.5 mg Oral BID   aspirin  81 mg Oral Q1200   atorvastatin  80 mg Oral Daily   calcium carbonate  750 mg Oral Daily   docusate sodium  100 mg Oral BID   midodrine  15 mg Oral TID WC   mometasone-formoterol  2 puff Inhalation BID   pantoprazole  40 mg Oral Daily   polyethylene glycol  17 g Oral BID   pyridostigmine  60 mg Oral Q8H   sertraline  25 mg Oral Daily   sodium chloride flush  3 mL Intravenous Q12H   sucralfate  1 g Oral TID WC & HS   tamsulosin  0.4 mg Oral Daily   Continuous Infusions:  sodium chloride 100 mL/hr at 11/01/22 1709   sodium chloride       LOS: 3 days    Time spent: 35 minutes    Pennie Banter, DO [facility] Pager [phone]

## 2022-11-03 DIAGNOSIS — Z01818 Encounter for other preprocedural examination: Secondary | ICD-10-CM

## 2022-11-03 DIAGNOSIS — S72001A Fracture of unspecified part of neck of right femur, initial encounter for closed fracture: Secondary | ICD-10-CM | POA: Diagnosis not present

## 2022-11-03 DIAGNOSIS — E861 Hypovolemia: Secondary | ICD-10-CM | POA: Diagnosis not present

## 2022-11-03 LAB — CBC
HCT: 24.7 % — ABNORMAL LOW (ref 39.0–52.0)
Hemoglobin: 7.9 g/dL — ABNORMAL LOW (ref 13.0–17.0)
MCH: 29.3 pg (ref 26.0–34.0)
MCHC: 32 g/dL (ref 30.0–36.0)
MCV: 91.5 fL (ref 80.0–100.0)
Platelets: 100 10*3/uL — ABNORMAL LOW (ref 150–400)
RBC: 2.7 MIL/uL — ABNORMAL LOW (ref 4.22–5.81)
RDW: 14.6 % (ref 11.5–15.5)
WBC: 6.8 10*3/uL (ref 4.0–10.5)
nRBC: 0 % (ref 0.0–0.2)

## 2022-11-03 LAB — BASIC METABOLIC PANEL
Anion gap: 4 — ABNORMAL LOW (ref 5–15)
BUN: 18 mg/dL (ref 8–23)
CO2: 23 mmol/L (ref 22–32)
Calcium: 8.7 mg/dL — ABNORMAL LOW (ref 8.9–10.3)
Chloride: 107 mmol/L (ref 98–111)
Creatinine, Ser: 1.47 mg/dL — ABNORMAL HIGH (ref 0.61–1.24)
GFR, Estimated: 48 mL/min — ABNORMAL LOW (ref >60)
Glucose, Bld: 78 mg/dL (ref 70–99)
Potassium: 4.5 mmol/L (ref 3.5–5.1)
Sodium: 134 mmol/L — ABNORMAL LOW (ref 135–145)

## 2022-11-03 LAB — RENAL FUNCTION PANEL
Albumin: 2.6 g/dL — ABNORMAL LOW (ref 3.5–5.0)
Anion gap: 5 (ref 5–15)
BUN: 19 mg/dL (ref 8–23)
CO2: 22 mmol/L (ref 22–32)
Calcium: 8.8 mg/dL — ABNORMAL LOW (ref 8.9–10.3)
Chloride: 107 mmol/L (ref 98–111)
Creatinine, Ser: 1.5 mg/dL — ABNORMAL HIGH (ref 0.61–1.24)
GFR, Estimated: 47 mL/min — ABNORMAL LOW (ref >60)
Glucose, Bld: 77 mg/dL (ref 70–99)
Phosphorus: 2.9 mg/dL (ref 2.5–4.6)
Potassium: 4.7 mmol/L (ref 3.5–5.1)
Sodium: 134 mmol/L — ABNORMAL LOW (ref 135–145)

## 2022-11-03 MED ORDER — ACETAMINOPHEN 500 MG PO TABS
1000.0000 mg | ORAL_TABLET | Freq: Three times a day (TID) | ORAL | Status: DC
  Administered 2022-11-03 – 2022-11-07 (×14): 1000 mg via ORAL
  Filled 2022-11-03 (×14): qty 2

## 2022-11-03 MED ORDER — TRAMADOL HCL 50 MG PO TABS
50.0000 mg | ORAL_TABLET | Freq: Four times a day (QID) | ORAL | Status: DC | PRN
  Administered 2022-11-04 – 2022-11-07 (×5): 50 mg via ORAL
  Filled 2022-11-03 (×5): qty 1

## 2022-11-03 MED ORDER — OXYCODONE HCL 5 MG PO TABS
5.0000 mg | ORAL_TABLET | Freq: Four times a day (QID) | ORAL | Status: DC | PRN
  Administered 2022-11-03 – 2022-11-07 (×7): 5 mg via ORAL
  Filled 2022-11-03 (×7): qty 1

## 2022-11-03 MED ORDER — GUAIFENESIN 100 MG/5ML PO LIQD
10.0000 mL | ORAL | Status: DC | PRN
  Administered 2022-11-03 – 2022-11-05 (×3): 10 mL via ORAL
  Filled 2022-11-03 (×3): qty 10

## 2022-11-03 MED ORDER — METOPROLOL TARTRATE 25 MG PO TABS
12.5000 mg | ORAL_TABLET | Freq: Two times a day (BID) | ORAL | Status: DC
  Administered 2022-11-03 – 2022-11-07 (×9): 12.5 mg via ORAL
  Filled 2022-11-03 (×9): qty 1

## 2022-11-03 MED ORDER — SENNOSIDES-DOCUSATE SODIUM 8.6-50 MG PO TABS: 1.0000 | ORAL_TABLET | Freq: Every evening | ORAL | Status: DC | PRN

## 2022-11-03 NOTE — Progress Notes (Signed)
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 S/p R hip hemiarthoplasty 10/31/22.    PT Comments    Pt premedicated prior to session.  BLE PROM with pt resisting ROM on right and left leg.  Unable to tolerate transition to sitting EOB with +1 assist.  Will continue with ex and mobility skills as tolerated.  Decreased frequency to 7x per week as he is not tolerating BID sessions.   Recommendations for follow up therapy are one component of a multi-disciplinary discharge planning process, led by the attending physician.  Recommendations may be updated based on patient status, additional functional criteria and insurance authorization.  Follow Up Recommendations       Assistance Recommended at Discharge Intermittent Supervision/Assistance  Patient can return home with the following Two people to help with walking and/or transfers;Two people to help with bathing/dressing/bathroom;Help with stairs or ramp for entrance   Equipment Recommendations  None recommended by PT    Recommendations for Other Services       Precautions / Restrictions Precautions Precautions: Fall;Posterior Hip Precaution Booklet Issued: No Restrictions Weight Bearing Restrictions: Yes RLE Weight Bearing: Weight bearing as tolerated     Mobility  Bed Mobility               General bed mobility comments: resists all attempts Patient Response: Anxious, Flat affect  Transfers                        Ambulation/Gait                   Stairs             Wheelchair Mobility     Modified Rankin (Stroke Patients Only)       Balance Overall balance assessment: Needs assistance, History of Falls Sitting-balance support: Bilateral upper extremity supported, Feet supported   Sitting balance - Comments: When attempted, pt declined reporting RLE pain.                                    Cognition Arousal/Alertness: Awake/alert Behavior During Therapy: WFL for tasks assessed/performed Overall Cognitive Status: History of cognitive impairments - at baseline                                          Exercises Other Exercises Other Exercises: BLE PROM    General Comments        Pertinent Vitals/Pain Pain Assessment Pain Assessment: Faces Faces Pain Scale: Hurts whole lot Pain Location: Rt hip Pain Descriptors / Indicators: Grimacing, Moaning, Sharp, Tender Pain Intervention(s): Limited activity within patient's tolerance, Monitored during session, Premedicated before session    Home Living                          Prior Function            PT Goals (current goals can now be found in the  care plan section) Progress towards PT goals: Progressing toward goals    Frequency    7X/week      PT Plan Current plan remains appropriate    Co-evaluation              AM-PAC PT "6 Clicks" Mobility   Outcome Measure  Help needed turning from your back to your side while in a flat bed without using bedrails?: Total Help needed moving from lying on your back to sitting on the side of a flat bed without using bedrails?: Total Help needed moving to and from a bed to a chair (including a wheelchair)?: Total Help needed standing up from a chair using your arms (e.g., wheelchair or bedside chair)?: Total Help needed to walk in hospital room?: Total Help needed climbing 3-5 steps with a railing? : Total 6 Click Score: 6    End of Session Equipment Utilized During Treatment: Gait belt Activity Tolerance:  Patient limited by pain Patient left: in bed;with call bell/phone within reach;with bed alarm set Nurse Communication: Mobility status PT Visit Diagnosis: Unsteadiness on feet (R26.81);Difficulty in walking, not elsewhere classified (R26.2);Other abnormalities of gait and mobility (R26.89)     Time: 1610-9604 PT Time Calculation (min) (ACUTE ONLY): 10 min  Charges:  $Therapeutic Exercise: 8-22 mins                   Danielle Dess, PTA 11/03/22, 11:13 AM

## 2022-11-03 NOTE — TOC Progression Note (Addendum)
Transition of Care [facility]) - Progression Note    Patient Details  Name: Matthew Zamora MRN: [id number] Date of Birth: Aug 07, 1940  Transition of Care [employer]) CM/SW Contact  Liliana Cline, LCSW Phone Number: 11/03/2022, 9:48 AM  Clinical Narrative:    [facility] to start insurance authorization for planned DC to [facility] tomorrow. Reference #: [id number]. Clinicals are being faxed.   2:17- Updated PT note from today faxed to [facility].        Expected Discharge Plan and Services                                               Social Determinants of Health (SDOH) Interventions SDOH Screenings   Food Insecurity: No Food Insecurity (10/30/2022)  Housing: Low Risk  (10/30/2022)  Transportation Needs: No Transportation Needs (10/30/2022)  Utilities: Not At Risk (10/30/2022)  Tobacco Use: Medium Risk (11/01/2022)    Readmission Risk Interventions    04/22/2022    3:54 PM  Readmission Risk Prevention Plan  Transportation Screening Complete  Medication Review (RN Care Manager) Complete  PCP or Specialist appointment within 3-5 days of discharge Complete  HRI or Home Care Consult Complete  SW Recovery Care/Counseling Consult Complete  Palliative Care Screening Not Applicable  Skilled Nursing Facility Not Applicable

## 2022-11-03 NOTE — Progress Notes (Signed)
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  Hemodynamically stable in the emergency room.  Admitted with orthopedic consultation.  Last dose of Eliquis 5/15 afternoon.   5/16 - underwent Right hip hemiarthroplasty 5/19 - recurrent a-fib mild RVR with HR in 100-110's. Resumed low dose metoprolol.   Assessment & Plan:   Closed traumatic right femoral fracture: Right hip hemiarthroplasty Dr. Audelia Acton 5/16. Weightbearing as tolerated Adequate pain medications, currently managed with oral pain medication along with bowel regimen. Eliquis 2.5 mg twice daily for DVT prophylaxis, taking before procedure. Awaiting SNF placement for rehab.  Paroxysmal A-fib: Currently sinus rhythm.  Eliquis resumed. 5/19: HR's in 100's to 110's.  EKG shows A-fib. Continue telemetry Start low dose PO metoprolol 12.5 mg BID  (previously admission, rate control agent of antiarrhythmic had to be stopped due to hypotension) Previously on amiodarone and mexilitene, d/c'd in November due to debilitating dizziness and orthostatic hypotension.  Followed by [facility].  Chronic hypotension, a.m. cortisol adequate.  Patient on midodrine that is continued.  History of coronary artery disease, stable on aspirin and Eliquis.  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  On 2 L oxygen.  Continue bronchodilator therapy.  CKD stage IIIb: History of baseline creatinine about 1.4-1.5.  Developed hyperkalemia.  Tend to be hyperkalemic.  Recheck tomorrow morning.  Not on any potassium replacement.   DVT  prophylaxis: apixaban (ELIQUIS) tablet 2.5 mg Start: 11/01/22 2000 SCDs Start: 10/31/22 1723 SCDs Start: 10/30/22 2144 apixaban (ELIQUIS) tablet 2.5 mg   Code Status: Full code Family Communication: Son and daughter-in-law updated at bedside this afternoon.  Disposition Plan:  SNF  Status is: Inpatient Remains inpatient appropriate because: Inpatient surgery planned     Consultants:  Orthopedics  Procedures:  ORIF right hip.  Antimicrobials:  None   Subjective:  Pt awake sitting up in bed this AM.  He reports a cough today.  States he does sometimes start coughing when swallowing foods or drinks, occasionally.  Denies fever/chills or other complaints.  He reportedly has not been up with PT since surgery yet.  Continues to endorse significant pain.  Seems a bit less confused today.   Objective: Vitals:   11/03/22 0100 11/03/22 0357 11/03/22 0907 11/03/22 1225  BP:  124/81 (!) 149/88 125/74  Pulse: (!) 120 (!) 113 (!) 115 (!) 111  Resp:  16  14  Temp:  97.9 F (36.6 C) 98.2 F (36.8 C) 98.5 F (36.9 C)  TempSrc:   Oral   SpO2: 93% 98% 100% 99%  Weight:      Height:        Intake/Output Summary (Last 24 hours) at 11/03/2022 1335 Last data filed at 11/03/2022 0229 Gross per 24 hour  Intake --  Output 400 ml  Net -400 ml   Filed Weights   10/30/22 1912 10/31/22 1210  Weight: 59.9 kg 61.2 kg    Examination:  General exam: awake, alert, no acute distress, frail, confused HEENT: moist mucus membranes, hearing grossly normal  Respiratory system: CTAB, no wheezes, rales or rhonchi, normal respiratory effort. Cardiovascular system: irregularly irregular, intact pedal pulses.  Gastrointestinal system: soft, NT, ND, no HSM felt, +bowel sounds. Central nervous system: A&O x 2. no gross focal neurologic deficits, normal speech Skin: dry, intact, normal temperature Psychiatry: normal mood, congruent affect, confused     Data Reviewed: I have personally reviewed  following labs and imaging studies  CBC: Recent Labs  Lab 10/30/22 1938 10/31/22 0611 10/31/22 1354 11/01/22 0910 11/02/22 0456 11/03/22 0433  WBC 8.9 8.1 7.3 11.4* 8.2 6.8  NEUTROABS 7.4  --   --   --   --   --   HGB 10.1* 9.9* 8.8* 9.6* 8.7* 7.9*  HCT 32.1* 32.3* 28.3* 30.5* 27.1* 24.7*  MCV 93.9 94.2 93.7 93.0 91.2 91.5  PLT 180 179 159 103* 107* 100*   Basic Metabolic Panel: Recent Labs  Lab 10/31/22 0611 10/31/22 1046 11/01/22 0702 11/02/22 0456 11/03/22 0433 11/03/22 0437  NA 135 133* 134* 132* 134* 134*  K 5.2* 4.8 5.3* 4.4 4.5 4.7  CL 101 100 106 105 107 107  CO2 27 24 20* 22 23 22   GLUCOSE 98 83 89 85 78 77  BUN 18 19 18 20 18 19   CREATININE 1.62* 1.62* 1.67* 1.72* 1.47* 1.50*  CALCIUM 9.1 8.9 8.4* 8.4* 8.7* 8.8*  PHOS 4.1  --   --   --   --  2.9   GFR: Estimated Creatinine Clearance: 34 mL/min (A) (by C-G formula based on SCr of 1.5 mg/dL (H)). Liver Function Tests: Recent Labs  Lab 10/31/22 0611 11/03/22 0437  AST 24  --   ALT 19  --   ALKPHOS 71  --   BILITOT 0.8  --   PROT 6.5  --   ALBUMIN 3.6 2.6*   No results for input(s): "LIPASE", "AMYLASE" in the last 168 hours. No results for input(s): "AMMONIA" in the last 168 hours. Coagulation Profile: Recent Labs  Lab 10/31/22 0611  INR 1.2   Cardiac Enzymes: No results for input(s): "CKTOTAL", "CKMB", "CKMBINDEX", "TROPONINI" in the last 168 hours. BNP (last 3 results) No results for input(s): "PROBNP" in the last 8760 hours. HbA1C: No results for input(s): "HGBA1C" in the last 72 hours. CBG: Recent Labs  Lab 11/01/22 0834 11/01/22 1628  GLUCAP 93 89   Lipid Profile: No results for input(s): "CHOL", "HDL", "LDLCALC", "TRIG", "CHOLHDL", "LDLDIRECT" in the last 72 hours. Thyroid Function Tests: No results for input(s): "TSH", "T4TOTAL", "FREET4", "T3FREE", "THYROIDAB" in the last 72 hours.  Anemia Panel: No results for input(s): "VITAMINB12", "FOLATE", "FERRITIN", "TIBC", "IRON",  "RETICCTPCT" in the last 72 hours. Sepsis Labs: No results for input(s): "PROCALCITON", "LATICACIDVEN" in the last 168 hours.  No results found for this or any previous visit (from the past 240 hour(s)).       Radiology Studies: No results found.      Scheduled Meds:  acetaminophen  1,000 mg Oral Q8H   apixaban  2.5 mg Oral BID   aspirin  81 mg Oral Q1200   atorvastatin  80 mg Oral Daily   calcium carbonate  750 mg Oral Daily   docusate sodium  100 mg Oral BID   metoprolol tartrate  12.5 mg Oral BID   midodrine  15 mg Oral TID WC   mometasone-formoterol  2 puff Inhalation BID   pantoprazole  40 mg Oral Daily   polyethylene glycol  17 g Oral BID   pyridostigmine  60 mg Oral Q8H   sertraline  25 mg Oral Daily   sodium chloride flush  3 mL Intravenous Q12H  sucralfate  1 g Oral TID WC & HS   tamsulosin  0.4 mg Oral Daily   Continuous Infusions:  sodium chloride       LOS: 4 days    Time spent: 45 minutes    Pennie Banter, DO [facility] Pager [phone]

## 2022-11-03 NOTE — Plan of Care (Signed)
  Problem: Clinical Measurements: Goal: Ability to maintain clinical measurements within normal limits will improve Outcome: Progressing Goal: Will remain free from infection Outcome: Progressing Goal: Respiratory complications will improve Outcome: Progressing Goal: Cardiovascular complication will be avoided Outcome: Progressing   Problem: Activity: Goal: Risk for activity intolerance will decrease Outcome: Progressing   Problem: Nutrition: Goal: Adequate nutrition will be maintained Outcome: Progressing   Problem: Coping: Goal: Level of anxiety will decrease Outcome: Progressing   Problem: Pain Managment: Goal: General experience of comfort will improve Outcome: Progressing   Problem: Safety: Goal: Ability to remain free from injury will improve Outcome: Progressing   Problem: Skin Integrity: Goal: Risk for impaired skin integrity will decrease Outcome: Progressing

## 2022-11-03 NOTE — Progress Notes (Signed)
  Subjective: 3 Days Post-Op Procedure(s) (LRB): ARTHROPLASTY BIPOLAR HIP (HEMIARTHROPLASTY)-RNFA (Right) Patient reports pain as moderate in the right hip.   Patient is  well but did have some N/V last night. PT and care management to assist with D/C planning. Negative for chest pain and shortness of breath Fever: no Gastrointestinal:Negative for nausea and vomiting this morning.  Objective: Vital signs in last 24 hours: Temp:  [97.4 F (36.3 C)-98.5 F (36.9 C)] 97.9 F (36.6 C) (05/19 0357) Pulse Rate:  [102-120] 113 (05/19 0357) Resp:  [16-20] 16 (05/19 0357) BP: (107-159)/(67-85) 124/81 (05/19 0357) SpO2:  [93 %-99 %] 98 % (05/19 0357)  Intake/Output from previous day:  Intake/Output Summary (Last 24 hours) at 11/03/2022 0703 Last data filed at 11/03/2022 0229 Gross per 24 hour  Intake --  Output 400 ml  Net -400 ml    Intake/Output this shift: No intake/output data recorded.  Labs: Recent Labs    10/31/22 1354 11/01/22 0910 11/02/22 0456 11/03/22 0433  HGB 8.8* 9.6* 8.7* 7.9*   Recent Labs    11/02/22 0456 11/03/22 0433  WBC 8.2 6.8  RBC 2.97* 2.70*  HCT 27.1* 24.7*  PLT 107* 100*   Recent Labs    11/03/22 0433 11/03/22 0437  NA 134* 134*  K 4.5 4.7  CL 107 107  CO2 23 22  BUN 18 19  CREATININE 1.47* 1.50*  GLUCOSE 78 77  CALCIUM 8.7* 8.8*   No results for input(s): "LABPT", "INR" in the last 72 hours.    EXAM General - Patient is Alert, Appropriate, and Oriented Extremity - ABD soft Neurovascular intact Dorsiflexion/Plantar flexion intact Incision: scant drainage No cellulitis present Compartment soft Dressing/Incision -no drainage noted to the right hip honeycomb dressing. Motor Function - intact, moving foot and toes well on exam.  Abdomen soft with intact bowel sounds this morning.  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 pylori infection    History of hepatitis    Hyperlipidemia    Hypertension    Liver cyst 05/16/15   PAF (paroxysmal atrial fibrillation) ([name])    Prostate CA ([name])     Assessment/Plan: 3 Days Post-Op Procedure(s) (LRB): ARTHROPLASTY BIPOLAR HIP (HEMIARTHROPLASTY)-RNFA (Right) Principal Problem:   Femoral fracture ([name]) Active Problems:   Hypotension   Osteoporosis   Closed displaced fracture of right femoral neck ([name])  Estimated body mass index is 20.53 kg/m as calculated from the following:   Height as of this encounter: 5\' 8"  (1.727 m).   Weight as of this encounter: 61.2 kg. Advance diet Up with therapy D/C IV fluids when tolerating po intake.  Acute blood loss anemia status post hip fracture.  Hemoglobin 7.9 and stable Up with therapy today, patient lives at home by himself but he reports close family support. Care management to assist with d/c planning, will likely need SNF following discharge.  Discharge planning:  f/u at [facility] clinic in 2 weeks for xrays and staple removal  DVT Prophylaxis -  SCDs and Eliquis Weight-Bearing as tolerated to right leg  Dedra Skeens PA-C [facility] Orthopaedic Surgery 11/03/2022, 7:03 AM

## 2022-11-03 NOTE — Plan of Care (Signed)
  Problem: Clinical Measurements: Goal: Ability to maintain clinical measurements within normal limits will improve Outcome: Progressing Goal: Will remain free from infection Outcome: Progressing   Problem: Activity: Goal: Risk for activity intolerance will decrease Outcome: Progressing   Problem: Pain Managment: Goal: General experience of comfort will improve Outcome: Progressing   Problem: Safety: Goal: Ability to remain free from injury will improve Outcome: Progressing   Problem: Skin Integrity: Goal: Risk for impaired skin integrity will decrease Outcome: Progressing   Problem: Pain Management: Goal: Pain level will decrease Outcome: Progressing

## 2022-11-04 ENCOUNTER — Inpatient Hospital Stay: Payer: Medicare Other

## 2022-11-04 DIAGNOSIS — I480 Paroxysmal atrial fibrillation: Secondary | ICD-10-CM

## 2022-11-04 DIAGNOSIS — J449 Chronic obstructive pulmonary disease, unspecified: Secondary | ICD-10-CM | POA: Diagnosis not present

## 2022-11-04 DIAGNOSIS — S72001A Fracture of unspecified part of neck of right femur, initial encounter for closed fracture: Secondary | ICD-10-CM | POA: Diagnosis not present

## 2022-11-04 LAB — CBC
HCT: 27.7 % — ABNORMAL LOW (ref 39.0–52.0)
Hemoglobin: 8.7 g/dL — ABNORMAL LOW (ref 13.0–17.0)
MCH: 29.2 pg (ref 26.0–34.0)
MCHC: 31.4 g/dL (ref 30.0–36.0)
MCV: 93 fL (ref 80.0–100.0)
Platelets: 123 10*3/uL — ABNORMAL LOW (ref 150–400)
RBC: 2.98 MIL/uL — ABNORMAL LOW (ref 4.22–5.81)
RDW: 14.4 % (ref 11.5–15.5)
WBC: 6.7 10*3/uL (ref 4.0–10.5)
nRBC: 0 % (ref 0.0–0.2)

## 2022-11-04 LAB — IRON AND TIBC
Iron: 15 ug/dL — ABNORMAL LOW (ref 45–182)
Saturation Ratios: 7 % — ABNORMAL LOW (ref 17.9–39.5)
TIBC: 211 ug/dL — ABNORMAL LOW (ref 250–450)
UIBC: 196 ug/dL

## 2022-11-04 LAB — RETICULOCYTES
Immature Retic Fract: 10.6 % (ref 2.3–15.9)
RBC.: 3 MIL/uL — ABNORMAL LOW (ref 4.22–5.81)
Retic Count, Absolute: 37.2 10*3/uL (ref 19.0–186.0)
Retic Ct Pct: 1.2 % (ref 0.4–3.1)

## 2022-11-04 LAB — BASIC METABOLIC PANEL
Anion gap: 6 (ref 5–15)
BUN: 20 mg/dL (ref 8–23)
CO2: 24 mmol/L (ref 22–32)
Calcium: 9.2 mg/dL (ref 8.9–10.3)
Chloride: 106 mmol/L (ref 98–111)
Creatinine, Ser: 1.51 mg/dL — ABNORMAL HIGH (ref 0.61–1.24)
GFR, Estimated: 46 mL/min — ABNORMAL LOW (ref >60)
Glucose, Bld: 96 mg/dL (ref 70–99)
Potassium: 4.3 mmol/L (ref 3.5–5.1)
Sodium: 136 mmol/L (ref 135–145)

## 2022-11-04 LAB — VITAMIN B12: Vitamin B-12: 759 pg/mL (ref 180–914)

## 2022-11-04 LAB — FOLATE: Folate: 13.1 ng/mL (ref >5.9)

## 2022-11-04 LAB — MAGNESIUM: Magnesium: 1.9 mg/dL (ref 1.7–2.4)

## 2022-11-04 LAB — FERRITIN: Ferritin: 368 ng/mL — ABNORMAL HIGH (ref 24–336)

## 2022-11-04 MED ORDER — SODIUM CHLORIDE 0.9 % IV SOLN
300.0000 mg | Freq: Once | INTRAVENOUS | Status: AC
  Administered 2022-11-04: 300 mg via INTRAVENOUS
  Filled 2022-11-04: qty 300

## 2022-11-04 MED ORDER — FUROSEMIDE 10 MG/ML IJ SOLN
20.0000 mg | Freq: Once | INTRAMUSCULAR | Status: AC
  Administered 2022-11-04: 20 mg via INTRAVENOUS
  Filled 2022-11-04: qty 4

## 2022-11-04 NOTE — Care Management Important Message (Signed)
Important Message  Patient Details  Name: Matthew Zamora MRN: [id number] Date of Birth: [birth year]   Medicare Important Message Given:  N/A - LOS <3 / Initial given by admissions     Olegario Messier A Dorenda Pfannenstiel 11/04/2022, 10:24 AM

## 2022-11-04 NOTE — TOC Progression Note (Signed)
Transition of Care [facility]) - Progression Note    Patient Details  Name: BRAXSON BRANDSMA MRN: [id number] Date of Birth: [birth year]  [facility] CM/SW Contact  [name], RN Phone Number: 11/04/2022, 1:53 PM  Clinical Narrative:    Checked insurance portal and Ins is still pending        Expected Discharge Plan and Services                                               Social Determinants of Health (SDOH) Interventions SDOH Screenings   Food Insecurity: No Food Insecurity (10/30/2022)  Housing: Low Risk  (10/30/2022)  Transportation Needs: No Transportation Needs (10/30/2022)  Utilities: Not At Risk (10/30/2022)  Tobacco Use: Medium Risk (11/01/2022)    Readmission Risk Interventions    04/22/2022    3:54 PM  Readmission Risk Prevention Plan  Transportation Screening Complete  Medication Review (RN Care Manager) Complete  PCP or Specialist appointment within 3-5 days of discharge Complete  HRI or Home Care Consult Complete  SW Recovery Care/Counseling Consult Complete  Palliative Care Screening Not Applicable  Skilled Nursing Facility Not Applicable

## 2022-11-04 NOTE — Progress Notes (Signed)
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 S/p R hip hemiarthoplasty 10/31/22.    PT Comments    Co-tx with OT 1 unit billed each.  Premedicated for session.  Pt agrees and is able to transition to EOB with mod/max a x 2.  Does not resist today.  Once sitting, he is able to sit unsupported with no  LOB while he is given time to wash his face and do some LAQ in sitting. He is able to stand with mod a x 2 and is noted to be inc soft BM.  Care is provided before he is assisted to supine with max a x 2 and positioned for comfort.  He does c/o difficulty breathing and is a bit anxious.  Sats are [facility] on 3lpm O2 but chest does sound full and RN is notified and will address. He is calmer after rest and is left with needs met.   Recommendations for follow up therapy are one component of a multi-disciplinary discharge planning process, led by the attending physician.  Recommendations may be updated based on patient status, additional functional criteria and insurance authorization.  Follow Up Recommendations  Can patient physically be transported by private vehicle: No    Assistance Recommended at Discharge Intermittent Supervision/Assistance  Patient can return home with the following Two people to help with walking and/or transfers;Two people to help with bathing/dressing/bathroom;Help with stairs or ramp for entrance   Equipment Recommendations  None recommended by PT    Recommendations for Other Services       Precautions / Restrictions Precautions Precautions: Fall;Posterior  Hip Restrictions Weight Bearing Restrictions: Yes RLE Weight Bearing: Weight bearing as tolerated     Mobility  Bed Mobility Overal bed mobility: Needs Assistance Bed Mobility: Supine to Sit, Sit to Supine     Supine to sit: Mod assist, +2 for physical assistance Sit to supine: HOB elevated, +2 for physical assistance, Mod assist     Patient Response: Anxious, Cooperative  Transfers Overall transfer level: Needs assistance Equipment used: Rolling walker (2 wheels) Transfers: Sit to/from Stand Sit to Stand: Mod assist, +2 physical assistance           General transfer comment: able to stand with +1 assist once up    Ambulation/Gait         Gait velocity: decreased     General Gait Details: unable   Stairs             Wheelchair Mobility    Modified Rankin (Stroke Patients Only)       Balance Overall balance assessment: Needs assistance, History of Falls Sitting-balance support: Bilateral upper extremity supported, Feet supported Sitting balance-Leahy Scale: Good Sitting balance - Comments: able to sit unsupported today with no assist or LOB   Standing balance support: Bilateral upper extremity supported Standing balance-Leahy Scale: Poor Standing balance comment: RW with MIN A to maintain stability while standing at bedside.                            Cognition Arousal/Alertness: Awake/alert Behavior During Therapy: WFL for tasks assessed/performed Overall Cognitive Status:  History of cognitive impairments - at baseline                                          Exercises      General Comments        Pertinent Vitals/Pain Pain Assessment Pain Assessment: Faces Faces Pain Scale: Hurts even more Pain Location: Rt hip Pain Descriptors / Indicators: Grimacing, Moaning, Sharp, Tender Pain Intervention(s): Repositioned, Limited activity within patient's tolerance, Monitored during session, Premedicated before  session    Home Living Family/patient expects to be discharged to:: Private residence Living Arrangements: Alone Available Help at Discharge: Family;Available PRN/intermittently Type of Home: Mobile home Home Access: Ramped entrance       Home Layout: One level Home Equipment: Rollator (4 wheels);Rolling Walker (2 wheels);Cane - single point;Wheelchair - manual;Toilet riser Additional Comments: DIL manages medications, provides transportation and check on patient 2x/day    Prior Function            PT Goals (current goals can now be found in the care plan section) Progress towards PT goals: Progressing toward goals    Frequency    7X/week      PT Plan Current plan remains appropriate    Co-evaluation PT/OT/SLP Co-Evaluation/Treatment: Yes Reason for Co-Treatment: To address functional/ADL transfers;For patient/therapist safety PT goals addressed during session: Mobility/safety with mobility OT goals addressed during session: ADL's and self-care;Proper use of Adaptive equipment and DME      AM-PAC PT "6 Clicks" Mobility   Outcome Measure  Help needed turning from your back to your side while in a flat bed without using bedrails?: Total Help needed moving from lying on your back to sitting on the side of a flat bed without using bedrails?: Total Help needed moving to and from a bed to a chair (including a wheelchair)?: Total Help needed standing up from a chair using your arms (e.g., wheelchair or bedside chair)?: A Lot Help needed to walk in hospital room?: Total Help needed climbing 3-5 steps with a railing? : Total 6 Click Score: 7    End of Session Equipment Utilized During Treatment: Gait belt Activity Tolerance: Patient tolerated treatment well;Patient limited by fatigue Patient left: in bed;with call bell/phone within reach;with bed alarm set Nurse Communication: Mobility status PT Visit Diagnosis: Unsteadiness on feet (R26.81);Difficulty in walking, not  elsewhere classified (R26.2);Other abnormalities of gait and mobility (R26.89)     Time: 4098-1191 PT Time Calculation (min) (ACUTE ONLY): 23 min  Charges:  $Therapeutic Activity: 8-22 mins                   Danielle Dess, PTA 11/04/22, 2:18 PM

## 2022-11-04 NOTE — Progress Notes (Addendum)
   Subjective: 4 Days Post-Op Procedure(s) (LRB): ARTHROPLASTY BIPOLAR HIP (HEMIARTHROPLASTY)-RNFA (Right) Patient reports pain as mild.   Patient is well, and has had no acute complaints or problems Denies any CP, SOB, ABD pain. We will continue therapy today.  Plan is to go Skilled nursing facility after hospital stay.  Objective: Vital signs in last 24 hours: Temp:  [97.1 F (36.2 C)-98.5 F (36.9 C)] 98.5 F (36.9 C) (05/20 0726) Pulse Rate:  [63-115] 107 (05/20 0726) Resp:  [14-20] 20 (05/20 0726) BP: (97-149)/(61-88) 114/72 (05/20 0726) SpO2:  [99 %-100 %] 100 % (05/20 0726)  Intake/Output from previous day: No intake/output data recorded. Intake/Output this shift: No intake/output data recorded.  Recent Labs    11/01/22 0910 11/02/22 0456 11/03/22 0433 11/04/22 0517  HGB 9.6* 8.7* 7.9* 8.7*   Recent Labs    11/03/22 0433 11/04/22 0517  WBC 6.8 6.7  RBC 2.70* 2.98*  3.00*  HCT 24.7* 27.7*  PLT 100* 123*   Recent Labs    11/03/22 0437 11/04/22 0517  NA 134* 136  K 4.7 4.3  CL 107 106  CO2 22 24  BUN 19 20  CREATININE 1.50* 1.51*  GLUCOSE 77 96  CALCIUM 8.8* 9.2   No results for input(s): "LABPT", "INR" in the last 72 hours.  EXAM General - Patient is Alert, Appropriate, and Oriented Extremity - Neurovascular intact Sensation intact distally Intact pulses distally Dorsiflexion/Plantar flexion intact No cellulitis present Compartment soft Dressing - dressing C/D/I and no drainage Motor Function - intact, moving foot and toes well on exam.   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 pylori infection     History of hepatitis    Hyperlipidemia    Hypertension    Liver cyst 05/16/15   PAF (paroxysmal atrial fibrillation) ([name])    Prostate CA ([name])     Assessment/Plan:   4 Days Post-Op Procedure(s) (LRB): ARTHROPLASTY BIPOLAR HIP (HEMIARTHROPLASTY)-RNFA (Right) Principal Problem:   Femoral fracture ([name]) Active Problems:   Paroxysmal atrial fibrillation ([name])   Hypotension   CKD (chronic kidney disease), stage IIIb   GERD without esophagitis   COPD (chronic obstructive pulmonary disease) ([name])   Osteoporosis   Closed displaced fracture of right femoral neck ([name])  Estimated body mass index is 20.53 kg/m as calculated from the following:   Height as of this encounter: 5\' 8"  (1.727 m).   Weight as of this encounter: 61.2 kg. Advance diet Up with therapy Vital signs stable Pain well-controlled Acute postop blood loss anemia with underlying chronic anemia-hemoglobin 8.7, stable.  Trending up. Care management to assist with discharge to skilled nursing facility today.  Follow-up with [facility] orthopedics in 2 weeks  DVT Prophylaxis - TED hose and SCDs Eliquis Weight-Bearing as tolerated to right leg   T. Cranston Neighbor, PA-C [facility] 11/04/2022, 7:41 AM   Patient chart reviewed, agree with above plan.  The patient is doing well status post right hip hemiarthroplasty, no concerns at this time, H/H stabilized.  Pain is controlled.  Plan for DC to SNF.   Reinaldo Berber MD

## 2022-11-04 NOTE — Progress Notes (Signed)
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 S/p R hip hemiarthoplasty 10/31/22.   OT comments  Pt seen for OT/PT treatment on this date. Upon arrival to room pt was long seated in bed working on his lunch, agreeable to tx. Pt requires MOD A +2 for bed mobility and sit<> stand. Once standing pt used RW and required MIN A to maintain balance. MAX A during peri hygiene in standing. Pt C/O SOB, Sp02 96% on 3L. RN made aware. Pt making good progress toward goals, will continue to follow POC. Discharge recommendation remains appropriate.     Recommendations for follow up therapy are one component of a multi-disciplinary discharge planning process, led by the attending physician.  Recommendations may be updated based on patient status, additional functional criteria and insurance authorization.    Assistance Recommended at Discharge Frequent or constant Supervision/Assistance  Patient can return home with the following  Two people to help with walking and/or transfers;Two people to help with bathing/dressing/bathroom;Assistance with cooking/housework;Direct supervision/assist for medications management;Assist for transportation;Help with stairs or ramp for entrance   Equipment Recommendations  Other (comment) (Defer)    Recommendations for Other Services      Precautions / Restrictions Precautions Precautions: Fall;Posterior Hip Restrictions Weight Bearing Restrictions: Yes RLE Weight Bearing: Weight bearing as tolerated       Mobility Bed Mobility Overal bed mobility: Needs  Assistance Bed Mobility: Supine to Sit, Sit to Supine     Supine to sit: Mod assist, +2 for physical assistance Sit to supine: HOB elevated, +2 for physical assistance, Mod assist        Transfers Overall transfer level: Needs assistance Equipment used: Rolling walker (2 wheels) Transfers: Sit to/from Stand Sit to Stand: Mod assist, +2 physical assistance                 Balance Overall balance assessment: Needs assistance, History of Falls Sitting-balance support: Bilateral upper extremity supported, Feet supported Sitting balance-Leahy Scale: Good Sitting balance - Comments: Pt tolerated sitting EOB, patient reported SOB, Sp02 were within normal range.   Standing balance support: Bilateral upper extremity supported Standing balance-Leahy Scale: Poor Standing balance comment: RW with MIN A to maintain stability while standing at bedside.                           ADL either performed or assessed with clinical judgement   ADL Overall ADL's : Needs assistance/impaired                             Toileting- Clothing Manipulation and Hygiene: +2 for physical assistance;Maximal assistance;Sit to/from stand         General ADL Comments: MAX A for peri care hygiene while standing.    Extremity/Trunk Assessment Upper Extremity Assessment Upper Extremity Assessment: Generalized weakness   Lower Extremity Assessment Lower Extremity Assessment: Generalized weakness        Vision       Perception     Praxis      Cognition Arousal/Alertness: Awake/alert Behavior During Therapy: WFL for tasks assessed/performed Overall Cognitive  Status: History of cognitive impairments - at baseline                                 General Comments: memory difficulty        Exercises      Shoulder Instructions       General Comments      Pertinent Vitals/ Pain       Pain Assessment Pain Assessment: Faces Faces Pain Scale: Hurts  even more Breathing: occasional labored breathing, short period of hyperventilation Pain Location: Rt hip Pain Descriptors / Indicators: Grimacing, Moaning, Sharp, Tender Pain Intervention(s): Repositioned, Relaxation, Other (comment), Premedicated before session  Home Living Family/patient expects to be discharged to:: Private residence Living Arrangements: Alone Available Help at Discharge: Family;Available PRN/intermittently Type of Home: Mobile home Home Access: Ramped entrance     Home Layout: One level     Bathroom Shower/Tub: Producer, television/film/video: Standard Bathroom Accessibility: Yes How Accessible: Accessible via walker Home Equipment: Rollator (4 wheels);Rolling Walker (2 wheels);Cane - single point;Wheelchair - manual;Toilet riser   Additional Comments: DIL manages medications, provides transportation and check on patient 2x/day      Prior Functioning/Environment              Frequency  Min 1X/week        Progress Toward Goals  OT Goals(current goals can now be found in the care plan section)  Progress towards OT goals: Progressing toward goals  Acute Rehab OT Goals Patient Stated Goal: To start moving more withou pain OT Goal Formulation: With patient Time For Goal Achievement: 11/15/22 Potential to Achieve Goals: Good ADL Goals Pt Will Perform Lower Body Bathing: sitting/lateral leans;with min assist Pt Will Perform Lower Body Dressing: with min assist;with caregiver independent in assisting;sit to/from stand Pt Will Transfer to Toilet: with min assist;stand pivot transfer;bedside commode;with +2 assist  Plan Discharge plan remains appropriate    Co-evaluation    PT/OT/SLP Co-Evaluation/Treatment: Yes Reason for Co-Treatment: To address functional/ADL transfers;For patient/therapist safety PT goals addressed during session: Mobility/safety with mobility OT goals addressed during session: ADL's and self-care;Proper use of Adaptive  equipment and DME      AM-PAC OT "6 Clicks" Daily Activity     Outcome Measure   Help from another person eating meals?: None Help from another person taking care of personal grooming?: A Little Help from another person toileting, which includes using toliet, bedpan, or urinal?: A Lot Help from another person bathing (including washing, rinsing, drying)?: A Lot Help from another person to put on and taking off regular upper body clothing?: A Lot Help from another person to put on and taking off regular lower body clothing?: A Lot 6 Click Score: 15    End of Session Equipment Utilized During Treatment: Gait belt;Rolling walker (2 wheels);Oxygen  OT Visit Diagnosis: Muscle weakness (generalized) (M62.81);History of falling (Z91.81);Pain Pain - Right/Left: Right Pain - part of body: Hip   Activity Tolerance Patient tolerated treatment well;Patient limited by pain   Patient Left in bed;with call bell/phone within reach;with bed alarm set   Nurse Communication          Time: 1610-9604 OT Time Calculation (min): 23 min  Charges: OT General Charges $OT Visit: 1 Visit OT Treatments $Self Care/Home Management : 8-22 mins  Thresa Ross, OTS

## 2022-11-04 NOTE — TOC Progression Note (Signed)
Transition of Care [facility]) - Progression Note    Patient Details  Name: Matthew Zamora MRN: [id number] Date of Birth: [birth year]  Transition of Care [facility]) CM/SW Contact  [name], RN Phone Number: 11/04/2022, 11:36 AM  Clinical Narrative:   [name] health portal, the Ins Berkley Harvey is Pending, Ins ID [id number]         Expected Discharge Plan and Services                                               Social Determinants of Health (SDOH) Interventions SDOH Screenings   Food Insecurity: No Food Insecurity (10/30/2022)  Housing: Low Risk  (10/30/2022)  Transportation Needs: No Transportation Needs (10/30/2022)  Utilities: Not At Risk (10/30/2022)  Tobacco Use: Medium Risk (11/01/2022)    Readmission Risk Interventions    04/22/2022    3:54 PM  Readmission Risk Prevention Plan  Transportation Screening Complete  Medication Review (RN Care Manager) Complete  PCP or Specialist appointment within 3-5 days of discharge Complete  HRI or Home Care Consult Complete  SW Recovery Care/Counseling Consult Complete  Palliative Care Screening Not Applicable  Skilled Nursing Facility Not Applicable

## 2022-11-04 NOTE — Progress Notes (Addendum)
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  Hemodynamically stable in the emergency room.  Admitted with orthopedic consultation.  Last dose of Eliquis 5/15 afternoon.   5/16 - underwent Right hip hemiarthroplasty 5/19 - recurrent a-fib mild RVR with HR in 100-110's, occasionally > 120. Resumed low dose metoprolol. 5/20 - HR's on tele 90's to 110's.  Chest xray to evaluate cough shows edema.  Lasix 20 mg IV x 1.   Assessment & Plan:   Closed traumatic right femoral fracture: Right hip hemiarthroplasty Dr. Audelia Acton 5/16. Weightbearing as tolerated on LLE Adequate pain medications, currently managed with oral pain medication along with bowel regimen. Eliquis 2.5 mg twice daily for DVT prophylaxis, taking before procedure. Awaiting SNF placement for rehab. Follow up at [facility] in 2 weeks  Paroxysmal A-fib: Currently sinus rhythm.   5/19: HR's in 100's to 110's, occasionally > 120.  EKG shows A-fib. 5/20: telemetry with HR's 90's to 110's, A-fib --Continue telemetry --Continue Eliquis --Started low dose PO metoprolol 12.5 mg BID (5/19) (previously admission, rate control agent of antiarrhythmic had to be stopped due to hypotension) Previously on amiodarone and mexilitene, d/c'd in November due to debilitating dizziness and orthostatic hypotension.   Followed by [facility].  Cough - will obtain chest xray to evaluate for pneumonia or edema --Antitussives/mucolytics PRN for now --SLP for swallow evaluation   Normocytic anemia / Iron deficiency Hbg after surgery trended down to nadir of 7.9 (from 9.6).    Iron studies consistent with iron deficiency. No active bleeding signs. Hbg improved 7.9 >> 8.7 --IV iron infusion today --Monitor CBC  Chronic hypotension, a.m. cortisol adequate.  Patient on midodrine that is continued.  History of coronary artery disease, stable on aspirin and Eliquis.  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  On 2 L oxygen.   Continue bronchodilator therapy.  CKD stage IIIb: History of baseline creatinine about 1.4-1.5.  Developed hyperkalemia.  Tend to be hyperkalemic.  Recheck tomorrow morning.  Not on any potassium replacement.   DVT prophylaxis: apixaban (ELIQUIS) tablet 2.5 mg Start: 11/01/22 2000 SCDs Start: 10/31/22 1723 SCDs Start: 10/30/22 2144 apixaban (ELIQUIS) tablet 2.5 mg   Code Status: Full code Family Communication: Son and daughter-in-law updated at bedside this afternoon.  Disposition Plan:  SNF  Status is: Inpatient Remains inpatient appropriate because: Inpatient surgery planned     Consultants:  Orthopedics  Procedures:  ORIF right hip.  Antimicrobials:  None   Subjective:  Pt awake sitting up in bed this AM.  He reports cough is ongoing, maybe a bit worse today.  No fever or chills.  No chest pain or shortness of breath.  No palpitations.  States hip hurting as well.     Objective: Vitals:   11/03/22 1635 11/03/22 2114 11/04/22 0726 11/04/22 1125  BP: 97/70 110/61 114/72 105/80  Pulse: 63 99 (!) 107 (!) 104  Resp: 15 20 20 18   Temp: (!) 97.1 F (36.2 C) 98.3 F (36.8 C) 98.5 F (36.9 C) 98.7 F (37.1 C)  TempSrc:   Oral Oral  SpO2: 100% 100% 100% 100%  Weight:  Height:       No intake or output data in the 24 hours ending 11/04/22 1308  Filed Weights   10/30/22 1912 10/31/22 1210  Weight: 59.9 kg 61.2 kg    Examination:  General exam: awake, alert, no acute distress, frail HEENT: moist mucus membranes, hearing grossly normal  Respiratory system: CTAB, no  wheezes, rales or rhonchi, normal respiratory effort. Cardiovascular system: irregularly irregular, intact pedal pulses, no edema.   Gastrointestinal system: soft, NT, ND, no HSM felt, +bowel sounds. Central nervous system: A&O x 2+. no gross focal neurologic deficits, normal speech Skin: dry, intact, normal temperature Psychiatry: normal mood, congruent affect, confused    Data Reviewed: I have personally reviewed following labs and imaging studies  CBC: Recent Labs  Lab 10/30/22 1938 10/31/22 0611 10/31/22 1354 11/01/22 0910 11/02/22 0456 11/03/22 0433 11/04/22 0517  WBC 8.9   < > 7.3 11.4* 8.2 6.8 6.7  NEUTROABS 7.4  --   --   --   --   --   --   HGB 10.1*   < > 8.8* 9.6* 8.7* 7.9* 8.7*  HCT 32.1*   < > 28.3* 30.5* 27.1* 24.7* 27.7*  MCV 93.9   < > 93.7 93.0 91.2 91.5 93.0  PLT 180   < > 159 103* 107* 100* 123*   < > = values in this interval not displayed.   Basic Metabolic Panel: Recent Labs  Lab 10/31/22 0611 10/31/22 1046 11/01/22 0702 11/02/22 0456 11/03/22 0433 11/03/22 0437 11/04/22 0517  NA 135   < > 134* 132* 134* 134* 136  K 5.2*   < > 5.3* 4.4 4.5 4.7 4.3  CL 101   < > 106 105 107 107 106  CO2 27   < > 20* 22 23 22 24   GLUCOSE 98   < > 89 85 78 77 96  BUN 18   < > 18 20 18 19 20   CREATININE 1.62*   < > 1.67* 1.72* 1.47* 1.50* 1.51*  CALCIUM 9.1   < > 8.4* 8.4* 8.7* 8.8* 9.2  MG  --   --   --   --   --   --  1.9  PHOS 4.1  --   --   --   --  2.9  --    < > = values in this interval not displayed.   GFR: Estimated Creatinine Clearance: 33.8 mL/min (A) (by C-G formula based on SCr of 1.51 mg/dL (H)). Liver Function Tests: Recent Labs  Lab 10/31/22 0611 11/03/22 0437  AST 24  --   ALT 19  --   ALKPHOS 71  --   BILITOT 0.8  --   PROT 6.5  --   ALBUMIN 3.6 2.6*   No results for input(s): "LIPASE", "AMYLASE" in the last 168 hours. No results for input(s): "AMMONIA" in the last 168 hours. Coagulation Profile: Recent Labs  Lab 10/31/22 0611   INR 1.2   Cardiac Enzymes: No results for input(s): "CKTOTAL", "CKMB", "CKMBINDEX", "TROPONINI" in the last 168 hours. BNP (last 3 results) No results for input(s): "PROBNP" in the last 8760 hours. HbA1C: No results for input(s): "HGBA1C" in the last 72 hours. CBG: Recent Labs  Lab 11/01/22 0834 11/01/22 1628  GLUCAP 93 89   Lipid Profile: No results for input(s): "CHOL", "HDL", "LDLCALC", "TRIG", "CHOLHDL", "LDLDIRECT" in the last 72 hours. Thyroid Function Tests: No results for input(s): "TSH", "T4TOTAL", "FREET4", "T3FREE", "THYROIDAB" in the last 72 hours.  Anemia  Panel: Recent Labs    11/04/22 0517  VITAMINB12 759  FOLATE 13.1  FERRITIN 368*  TIBC 211*  IRON 15*  RETICCTPCT 1.2   Sepsis Labs: No results for input(s): "PROCALCITON", "LATICACIDVEN" in the last 168 hours.  No results found for this or any previous visit (from the past 240 hour(s)).       Radiology Studies: DG Chest Port 1 View  Result Date: 11/04/2022 CLINICAL DATA:  Cough EXAM: PORTABLE CHEST 1 VIEW COMPARISON:  X-ray 10/30/2022 FINDINGS: Underinflation compared to the prior x-ray. Stable cardiopericardial silhouette with calcified aorta. Left upper chest defibrillator with leads overlying the right side of the heart. Vascular congestion. No pneumothorax or effusion. Question trace edema. No separate consolidation IMPRESSION: Vascular congestion with question trace edema. Defibrillator. Decreased inflation compared to prior x-ray.  Recommend follow-up Electronically Signed   By: Karen Kays M.D.   On: 11/04/2022 10:38        Scheduled Meds:  acetaminophen  1,000 mg Oral Q8H   apixaban  2.5 mg Oral BID   aspirin  81 mg Oral Q1200   atorvastatin  80 mg Oral Daily   calcium carbonate  750 mg Oral Daily   docusate sodium  100 mg Oral BID   metoprolol tartrate  12.5 mg Oral BID   midodrine  15 mg Oral TID WC   mometasone-formoterol  2 puff Inhalation BID   pantoprazole  40 mg Oral Daily    polyethylene glycol  17 g Oral BID   pyridostigmine  60 mg Oral Q8H   sertraline  25 mg Oral Daily   sodium chloride flush  3 mL Intravenous Q12H   sucralfate  1 g Oral TID WC & HS   tamsulosin  0.4 mg Oral Daily   Continuous Infusions:  sodium chloride       LOS: 5 days    Time spent: 42 minutes    Pennie Banter, DO [facility] Pager [phone]

## 2022-11-05 DIAGNOSIS — I5043 Acute on chronic combined systolic (congestive) and diastolic (congestive) heart failure: Secondary | ICD-10-CM

## 2022-11-05 DIAGNOSIS — I480 Paroxysmal atrial fibrillation: Secondary | ICD-10-CM | POA: Diagnosis not present

## 2022-11-05 DIAGNOSIS — S72001A Fracture of unspecified part of neck of right femur, initial encounter for closed fracture: Secondary | ICD-10-CM | POA: Diagnosis not present

## 2022-11-05 LAB — CBC
HCT: 25.9 % — ABNORMAL LOW (ref 39.0–52.0)
Hemoglobin: 8.2 g/dL — ABNORMAL LOW (ref 13.0–17.0)
MCH: 29.2 pg (ref 26.0–34.0)
MCHC: 31.7 g/dL (ref 30.0–36.0)
MCV: 92.2 fL (ref 80.0–100.0)
Platelets: 109 10*3/uL — ABNORMAL LOW (ref 150–400)
RBC: 2.81 MIL/uL — ABNORMAL LOW (ref 4.22–5.81)
RDW: 14.5 % (ref 11.5–15.5)
WBC: 4.2 10*3/uL (ref 4.0–10.5)
nRBC: 0 % (ref 0.0–0.2)

## 2022-11-05 LAB — COMPREHENSIVE METABOLIC PANEL
ALT: 22 U/L (ref 0–44)
AST: 47 U/L — ABNORMAL HIGH (ref 15–41)
Albumin: 2.7 g/dL — ABNORMAL LOW (ref 3.5–5.0)
Alkaline Phosphatase: 51 U/L (ref 38–126)
Anion gap: 7 (ref 5–15)
BUN: 22 mg/dL (ref 8–23)
CO2: 24 mmol/L (ref 22–32)
Calcium: 8.7 mg/dL — ABNORMAL LOW (ref 8.9–10.3)
Chloride: 105 mmol/L (ref 98–111)
Creatinine, Ser: 1.28 mg/dL — ABNORMAL HIGH (ref 0.61–1.24)
GFR, Estimated: 57 mL/min — ABNORMAL LOW (ref >60)
Glucose, Bld: 71 mg/dL (ref 70–99)
Potassium: 4 mmol/L (ref 3.5–5.1)
Sodium: 136 mmol/L (ref 135–145)
Total Bilirubin: 0.9 mg/dL (ref 0.3–1.2)
Total Protein: 5.1 g/dL — ABNORMAL LOW (ref 6.5–8.1)

## 2022-11-05 LAB — MAGNESIUM: Magnesium: 1.7 mg/dL (ref 1.7–2.4)

## 2022-11-05 LAB — SURGICAL PATHOLOGY

## 2022-11-05 LAB — BRAIN NATRIURETIC PEPTIDE: B Natriuretic Peptide: 806.8 pg/mL — ABNORMAL HIGH (ref 0.0–100.0)

## 2022-11-05 LAB — PROCALCITONIN: Procalcitonin: 0.13 ng/mL

## 2022-11-05 MED ORDER — FUROSEMIDE 10 MG/ML IJ SOLN
20.0000 mg | Freq: Once | INTRAMUSCULAR | Status: AC
  Administered 2022-11-05: 20 mg via INTRAVENOUS
  Filled 2022-11-05: qty 4

## 2022-11-05 MED ORDER — MELATONIN 5 MG PO TABS
10.0000 mg | ORAL_TABLET | Freq: Every day | ORAL | Status: DC
  Administered 2022-11-05 – 2022-11-06 (×2): 10 mg via ORAL
  Filled 2022-11-05 (×2): qty 2

## 2022-11-05 MED ORDER — ENSURE ENLIVE PO LIQD
237.0000 mL | Freq: Two times a day (BID) | ORAL | Status: DC
  Administered 2022-11-05 – 2022-11-07 (×5): 237 mL via ORAL

## 2022-11-05 MED ORDER — AMIODARONE LOAD VIA INFUSION
150.0000 mg | Freq: Once | INTRAVENOUS | Status: AC
  Administered 2022-11-05: 150 mg via INTRAVENOUS
  Filled 2022-11-05: qty 83.34

## 2022-11-05 MED ORDER — MAGNESIUM SULFATE 2 GM/50ML IV SOLN
2.0000 g | Freq: Once | INTRAVENOUS | Status: AC
  Administered 2022-11-05: 2 g via INTRAVENOUS
  Filled 2022-11-05: qty 50

## 2022-11-05 MED ORDER — IPRATROPIUM-ALBUTEROL 0.5-2.5 (3) MG/3ML IN SOLN
3.0000 mL | Freq: Three times a day (TID) | RESPIRATORY_TRACT | Status: DC
  Administered 2022-11-05 (×2): 3 mL via RESPIRATORY_TRACT
  Filled 2022-11-05: qty 3

## 2022-11-05 MED ORDER — GABAPENTIN 100 MG PO CAPS
100.0000 mg | ORAL_CAPSULE | Freq: Every day | ORAL | Status: DC
  Administered 2022-11-05 – 2022-11-06 (×2): 100 mg via ORAL
  Filled 2022-11-05 (×2): qty 1

## 2022-11-05 MED ORDER — AMIODARONE HCL IN DEXTROSE 360-4.14 MG/200ML-% IV SOLN
30.0000 mg/h | INTRAVENOUS | Status: DC
  Administered 2022-11-05 – 2022-11-07 (×4): 30 mg/h via INTRAVENOUS
  Filled 2022-11-05 (×4): qty 200

## 2022-11-05 MED ORDER — AMIODARONE HCL IN DEXTROSE 360-4.14 MG/200ML-% IV SOLN
60.0000 mg/h | INTRAVENOUS | Status: DC
  Administered 2022-11-05: 60 mg/h via INTRAVENOUS
  Filled 2022-11-05 (×2): qty 200

## 2022-11-05 MED ORDER — PROSOURCE PLUS PO LIQD
30.0000 mL | Freq: Three times a day (TID) | ORAL | Status: DC
  Administered 2022-11-05 – 2022-11-07 (×7): 30 mL via ORAL
  Filled 2022-11-05 (×8): qty 30

## 2022-11-05 MED ORDER — DM-GUAIFENESIN ER 30-600 MG PO TB12
1.0000 | ORAL_TABLET | Freq: Two times a day (BID) | ORAL | Status: DC
  Administered 2022-11-05 – 2022-11-07 (×5): 1 via ORAL
  Filled 2022-11-05 (×5): qty 1

## 2022-11-05 NOTE — TOC Progression Note (Signed)
Transition of Care [facility]) - Progression Note    Patient Details  Name: Matthew Zamora MRN: [id number] Date of Birth: October 11, 1940  Transition of Care [facility]) CM/SW Contact  [name], RN Phone Number: 11/05/2022, 10:00 AM  Clinical Narrative:   Ins Berkley Harvey approved, [id number] 5/20-5/22         Expected Discharge Plan and Services                                               Social Determinants of Health (SDOH) Interventions SDOH Screenings   Food Insecurity: No Food Insecurity (10/30/2022)  Housing: Low Risk  (10/30/2022)  Transportation Needs: No Transportation Needs (10/30/2022)  Utilities: Not At Risk (10/30/2022)  Tobacco Use: Medium Risk (11/01/2022)    Readmission Risk Interventions    04/22/2022    3:54 PM  Readmission Risk Prevention Plan  Transportation Screening Complete  Medication Review (RN Care Manager) Complete  PCP or Specialist appointment within 3-5 days of discharge Complete  HRI or Home Care Consult Complete  SW Recovery Care/Counseling Consult Complete  Palliative Care Screening Not Applicable  Skilled Nursing Facility Not Applicable

## 2022-11-05 NOTE — Care Management Important Message (Signed)
Important Message  Patient Details  Name: SAHITH WHITEHALL MRN: [id number] Date of Birth: 1940-08-25   Medicare Important Message Given:  Yes     Olegario Messier A Atsushi Yom 11/05/2022, 12:25 PM

## 2022-11-05 NOTE — Progress Notes (Signed)
Subjective: 5 Days Post-Op Procedure(s) (LRB): ARTHROPLASTY BIPOLAR HIP (HEMIARTHROPLASTY)-RNFA (Right) Patient reports pain as mild.   Patient is well, and has had no acute complaints or problems Denies any CP, SOB, ABD pain. We will continue therapy today.  Plan is to go Skilled nursing facility after hospital stay.  Objective: Vital signs in last 24 hours: Temp:  [98.2 F (36.8 C)-98.5 F (36.9 C)] 98.2 F (36.8 C) (05/21 0805) Pulse Rate:  [96-101] 100 (05/20 2251) Resp:  [16-18] 18 (05/21 0805) BP: (95-116)/(52-86) 109/69 (05/21 0805) SpO2:  [97 %-99 %] 98 % (05/21 1133)  Intake/Output from previous day: 05/20 0701 - 05/21 0700 In: -  Out: 500 [Urine:500] Intake/Output this shift: No intake/output data recorded.  Recent Labs    11/03/22 0433 11/04/22 0517 11/05/22 0830  HGB 7.9* 8.7* 8.2*   Recent Labs    11/04/22 0517 11/05/22 0830  WBC 6.7 4.2  RBC 2.98*  3.00* 2.81*  HCT 27.7* 25.9*  PLT 123* 109*   Recent Labs    11/03/22 0437 11/04/22 0517  NA 134* 136  K 4.7 4.3  CL 107 106  CO2 22 24  BUN 19 20  CREATININE 1.50* 1.51*  GLUCOSE 77 96  CALCIUM 8.8* 9.2   No results for input(s): "LABPT", "INR" in the last 72 hours.  EXAM General - Patient is Alert, Appropriate, and Oriented Extremity - Neurovascular intact Sensation intact distally Intact pulses distally Dorsiflexion/Plantar flexion intact No cellulitis present Compartment soft Dressing - dressing C/D/I and no drainage Motor Function - intact, moving foot and toes well on exam.   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 pylori infection     History of hepatitis    Hyperlipidemia    Hypertension    Liver cyst 05/16/15   PAF (paroxysmal atrial fibrillation) ([name])    Prostate CA ([name])     Assessment/Plan:   5 Days Post-Op Procedure(s) (LRB): ARTHROPLASTY BIPOLAR HIP (HEMIARTHROPLASTY)-RNFA (Right) Principal Problem:   Femoral fracture ([name]) Active Problems:   Paroxysmal atrial fibrillation ([name])   Hypotension   CKD (chronic kidney disease), stage IIIb   GERD without esophagitis   COPD (chronic obstructive pulmonary disease) ([name])   Normocytic anemia   Osteoporosis   Closed displaced fracture of right femoral neck ([name])  Estimated body mass index is 20.53 kg/m as calculated from the following:   Height as of this encounter: 5\' 8"  (1.727 m).   Weight as of this encounter: 61.2 kg. Advance diet Up with therapy Vital signs stable Pain well-controlled Acute postop blood loss anemia with underlying chronic anemia-hemoglobin 8.2, stable.  Care management to assist with discharge to skilled nursing facility.  Follow-up with [facility] in 2 weeks  DVT Prophylaxis - TED hose and SCDs Eliquis Weight-Bearing as tolerated to right leg   T. Cranston Neighbor, PA-C [facility] 11/05/2022, 12:02 PM  Patient seen and examined, agree with above plan.  The patient is doing well status post right hip hemiarthroplasty, no concerns time of evaluation.  Pain is controlled.  Discussed DVT prophylaxis, pain medication use, and safe transition to rehab.  All  questions answered.   Reinaldo Berber MD

## 2022-11-05 NOTE — Consult Note (Addendum)
[facility] CLINIC CARDIOLOGY CONSULT NOTE       Patient ID: Matthew Zamora MRN: [id number] DOB/AGE: 81/02/1941 81 y.o.  Admit date: 10/30/2022 Referring Physician Dr. Esaw Grandchild Primary Physician Dr. Jerl Mina Primary Cardiologist Derwood Kaplan, NP -[facility] health cardiology Reason for Consultation Atrial fibrillation with RVR  HPI: Matthew Zamora is a 82 y.o. male who presented to the ED on 10/30/2022 for hip pain. 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  Upon workup in the ED patient was found to have hip fracture and subsequently was taken from surgical repair on 10/31/2022.  Postoperatively he was noted to be in atrial fibrillation with rates in the 100-110s, home metoprolol was restarted.  He was noted to have a cough yesterday, chest x-ray demonstrated edema and he was given IV Lasix 20 mg x 1.  Today heart rate was elevated to the 130-140s. Cardiology was consulted for further evaluation and management of A-fib with RVR.   Patient reports that he was in his usual state of health prior to his admission to the hospital.  States that he fell and was having hip pain and this is why he came in to the ED.  Hip replacement done on 5/16 went well and he has been recovering since.  Patient reports that he was feeling fine this morning, did not notice that his heart was racing.  Around 1215 the patient's nurse entered his room at which time he told his nurse that his ICD had just shocked him.  At the time of my evaluation he is sitting upright in bed. He is denying chest pain, shortness of breath, dizziness, palpitations.  Patient stated that he got his device many years ago and over this time it has shocked him multiple times.  He has taken amiodarone in the past for  recurrent VT, this was discontinued at one point due to transaminitis but reportedly was restarted.  Patient states that he may have been taking this at home but is unsure, has not received any since he was admitted.  He was last seen by [facility] cardiology on 10/10/2022 for a device check, it was noted at this time that he was shocked on 10/09/2022 for rapid A-fib.  At this appointment his tachy therapies were adjusted to an intervention rate of 167 bpm in an effort to prevent inappropriate therapy during episodes of rapid A-fib.   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 pylori infection    History of hepatitis    Hyperlipidemia    Hypertension    Liver cyst 05/16/15   PAF (paroxysmal atrial fibrillation) ([name])    Prostate CA [facility])     Past Surgical History:  Procedure Laterality Date   CATARACT EXTRACTION     COLONOSCOPY  10/06/2014, 09/18/2004, 03/14/2009   ESOPHAGOGASTRODUODENOSCOPY  10/06/2014, 03/18/2001, 03/14/2009   ESOPHAGOGASTRODUODENOSCOPY (EGD)  WITH PROPOFOL N/A 05/07/2018   Procedure: ESOPHAGOGASTRODUODENOSCOPY (EGD) WITH PROPOFOL;  Surgeon: Toledo, Boykin Nearing, MD;  Location: [facility] ENDOSCOPY;  Service: Gastroenterology;  Laterality: N/A;   ESOPHAGOGASTRODUODENOSCOPY (EGD) WITH PROPOFOL N/A 04/22/2022   Procedure: ESOPHAGOGASTRODUODENOSCOPY (EGD) WITH PROPOFOL;  Surgeon: Toney Reil, MD;  Location: [facility] ENDOSCOPY;  Service: Endoscopy;  Laterality: N/A;   FLEXIBLE SIGMOIDOSCOPY  08/26/1990   HIP ARTHROPLASTY Right 10/31/2022   Procedure: ARTHROPLASTY BIPOLAR HIP  (HEMIARTHROPLASTY)-RNFA;  Surgeon: Reinaldo Berber, MD;  Location: [facility] ORS;  Service: Orthopedics;  Laterality: Right;   INSERTION OF ICD     PROSTATE SURGERY     TONSILLECTOMY      Medications Prior to Admission  Medication Sig Dispense Refill Last Dose   acetaminophen (TYLENOL) 325 MG tablet Take 2 tablets (650 mg total) by mouth every 4 (four) hours as needed for headache or mild pain.   10/30/2022   albuterol (PROVENTIL) (2.5 MG/3ML) 0.083% nebulizer solution Inhale 3 mLs (2.5 mg total) into the lungs as needed for shortness of breath. 75 mL 12 10/30/2022   aspirin 81 MG chewable tablet Chew 81 mg by mouth daily at 12 noon.   10/30/2022   atorvastatin (LIPITOR) 80 MG tablet Take 80 mg by mouth daily.   10/30/2022   calcium carbonate (TUMS EX) 750 MG chewable tablet Chew 1 tablet by mouth daily.   10/29/2022   ELIQUIS 2.5 MG TABS tablet Take 2.5 mg by mouth 2 (two) times daily.   10/30/2022   fluticasone-salmeterol (ADVAIR) 500-50 MCG/ACT AEPB Inhale 1 puff into the lungs in the morning and at bedtime.   10/30/2022   gabapentin (NEURONTIN) 100 MG capsule Take 100 mg by mouth at bedtime.   10/30/2022   magnesium oxide (MAG-OX) 400 (240 Mg) MG tablet Take 1 tablet by mouth daily.   10/29/2022   melatonin 3 MG TABS tablet Take 9 mg by mouth at bedtime.   10/29/2022   midodrine (PROAMATINE) 5 MG tablet Take 3 tablets (15 mg total) by mouth 3 (three) times daily with meals.   10/30/2022   ondansetron (ZOFRAN-ODT) 4 MG disintegrating tablet Take 4 mg by mouth every 8 (eight) hours as needed for refractory nausea / vomiting, vomiting or nausea.   Past Week   pantoprazole (PROTONIX) 40 MG tablet Take 1 tablet (40 mg total) by mouth 2 (two) times daily.   10/30/2022   pyridostigmine (MESTINON) 60 MG tablet Take 1 tablet (60 mg total) by mouth every 8 (eight) hours.   10/30/2022   sertraline (ZOLOFT) 25 MG tablet Take 25 mg by mouth daily.   10/30/2022   sucralfate (CARAFATE) 1 GM/10ML suspension Take 10 mLs (1 g  total) by mouth 4 (four) times daily -  with meals and at bedtime. 420 mL 0 10/29/2022   tamsulosin (FLOMAX) 0.4 MG CAPS capsule Take 0.4 mg by mouth daily.   10/29/2022   albuterol (VENTOLIN HFA) 108 (90 Base) MCG/ACT inhaler Inhale 2 puffs into the lungs every 4 (four) hours as needed.      ascorbic acid (VITAMIN C) 500 MG tablet Take 1 tablet by mouth daily.   Unknown   metoCLOPramide (REGLAN) 5 MG tablet Take 1 tablet (5 mg total) by mouth every 8 (eight) hours as needed for refractory nausea / vomiting.   More than a month   Multiple Vitamin (MULTIVITAMIN WITH MINERALS) TABS tablet Take 1 tablet by mouth daily.   Unknown   Nebulizer MISC 1 each by Does not apply route as needed. 1  each 0 Unknown   Nutritional Supplements (,FEEDING SUPPLEMENT, PROSOURCE PLUS) liquid Take 30 mLs by mouth 3 (three) times daily between meals.   Unknown   tadalafil (CIALIS) 20 MG tablet Take 20 mg by mouth daily.   Unknown   Social History   Socioeconomic History   Marital status: Single    Spouse name: Not on file   Number of children: Not on file   Years of education: Not on file   Highest education level: Not on file  Occupational History   Not on file  Tobacco Use   Smoking status: Former    Packs/day: 1.00    Years: 38.00    Additional pack years: 0.00    Total pack years: 38.00    Types: Cigarettes    Quit date: 06/18/1999    Years since quitting: 23.4   Smokeless tobacco: Never  Vaping Use   Vaping Use: Never used  Substance and Sexual Activity   Alcohol use: No    Alcohol/week: 0.0 standard drinks of alcohol   Drug use: Not Currently   Sexual activity: Not Currently  Other Topics Concern   Not on file  Social History Narrative   Not on file   Social Determinants of Health   Financial Resource Strain: Not on file  Food Insecurity: No Food Insecurity (10/30/2022)   Hunger Vital Sign    Worried About Running Out of Food in the Last Year: Never true    Ran Out of Food in the Last Year:  Never true  Transportation Needs: No Transportation Needs (10/30/2022)   PRAPARE - Administrator, Civil Service (Medical): No    Lack of Transportation (Non-Medical): No  Physical Activity: Not on file  Stress: Not on file  Social Connections: Not on file  Intimate Partner Violence: Not At Risk (10/30/2022)   Humiliation, Afraid, Rape, and Kick questionnaire    Fear of Current or Ex-Partner: No    Emotionally Abused: No    Physically Abused: No    Sexually Abused: No    Family History  Problem Relation Age of Onset   Heart attack Mother    Heart attack Father      Vitals:   11/05/22 1200 11/05/22 1217 11/05/22 1221 11/05/22 1232  BP:  (!) 145/82    Pulse: (!) 133 (!) 113 (!) 111 91  Resp:  18    Temp:      TempSrc:      SpO2:  96%    Weight:      Height:        PHYSICAL EXAM General: Ill-appearing elderly male sitting at an incline in bed, in acute distress. HEENT: Normocephalic and atraumatic. Neck: No JVD.  Lungs: Increased respiratory effort on 2L [place] which is his baseline.  Coarse breath sounds bilaterally in upper lungs. Heart: Irregularly irregular. Normal S1 and S2 without gallops or murmurs.  Abdomen: Non-distended appearing.  Msk: Normal strength and tone for age. Extremities: Warm and well perfused. No clubbing, cyanosis.  No edema.  Neuro: Alert and oriented X 3. Psych: Answers questions appropriately.   Labs: Basic Metabolic Panel: Recent Labs    11/03/22 0437 11/04/22 0517 11/05/22 0821  NA 134* 136 136  K 4.7 4.3 4.0  CL 107 106 105  CO2 22 24 24   GLUCOSE 77 96 71  BUN 19 20 22   CREATININE 1.50* 1.51* 1.28*  CALCIUM 8.8* 9.2 8.7*  MG  --  1.9 1.7  PHOS 2.9  --   --  Liver Function Tests: Recent Labs    11/03/22 0437 11/05/22 0821  AST  --  47*  ALT  --  22  ALKPHOS  --  51  BILITOT  --  0.9  PROT  --  5.1*  ALBUMIN 2.6* 2.7*   No results for input(s): "LIPASE", "AMYLASE" in the last 72 hours. CBC: Recent Labs     11/04/22 0517 11/05/22 0830  WBC 6.7 4.2  HGB 8.7* 8.2*  HCT 27.7* 25.9*  MCV 93.0 92.2  PLT 123* 109*   Cardiac Enzymes: No results for input(s): "CKTOTAL", "CKMB", "CKMBINDEX", "TROPONINIHS" in the last 72 hours. BNP: Recent Labs    11/05/22 0830  BNP 806.8*   D-Dimer: No results for input(s): "DDIMER" in the last 72 hours. Hemoglobin A1C: No results for input(s): "HGBA1C" in the last 72 hours. Fasting Lipid Panel: No results for input(s): "CHOL", "HDL", "LDLCALC", "TRIG", "CHOLHDL", "LDLDIRECT" in the last 72 hours. Thyroid Function Tests: No results for input(s): "TSH", "T4TOTAL", "T3FREE", "THYROIDAB" in the last 72 hours.  Invalid input(s): "FREET3" Anemia Panel: Recent Labs    11/04/22 0517  VITAMINB12 759  FOLATE 13.1  FERRITIN 368*  TIBC 211*  IRON 15*  RETICCTPCT 1.2     Radiology: [facility] Chest Port 1 View  Result Date: 11/04/2022 CLINICAL DATA:  Cough EXAM: PORTABLE CHEST 1 VIEW COMPARISON:  X-ray 10/30/2022 FINDINGS: Underinflation compared to the prior x-ray. Stable cardiopericardial silhouette with calcified aorta. Left upper chest defibrillator with leads overlying the right side of the heart. Vascular congestion. No pneumothorax or effusion. Question trace edema. No separate consolidation IMPRESSION: Vascular congestion with question trace edema. Defibrillator. Decreased inflation compared to prior x-ray.  Recommend follow-up Electronically Signed   By: Karen Kays M.D.   On: 11/04/2022 10:38   DG Pelvis Portable  Result Date: 10/31/2022 CLINICAL DATA:  Bipolar right hip hemiarthroplasty. EXAM: PORTABLE PELVIS 1-2 VIEWS COMPARISON:  CT right hip 11/02/2022 FINDINGS: Single frontal view of the bilateral hips. Interval bipolar right hip hemiarthroplasty with normal alignment. Expected postoperative subcutaneous air about the right hip. Mild superomedial left femoroacetabular joint space narrowing. Surgical clips overlie the pelvis. No acute fracture or  dislocation. IMPRESSION: Interval bipolar right hip hemiarthroplasty with normal alignment. Electronically Signed   By: Neita Garnet M.D.   On: 10/31/2022 16:23   CT Hip Right Wo Contrast  Result Date: 10/30/2022 CLINICAL DATA:  Right femoral fracture EXAM: CT OF THE RIGHT HIP WITHOUT CONTRAST TECHNIQUE: Multidetector CT imaging of the right hip was performed according to the standard protocol. Multiplanar CT image reconstructions were also generated. RADIATION DOSE REDUCTION: This exam was performed according to the departmental dose-optimization program which includes automated exposure control, adjustment of the mA and/or kV according to patient size and/or use of iterative reconstruction technique. COMPARISON:  Right hips and pelvis radiograph dated 10/30/2022 FINDINGS: Bones/Joint/Cartilage Acute transcervical fracture of the right proximal femur with apex anterior angulation. The right femoroacetabular joint is intact. Ligaments Suboptimally assessed by CT. Muscles and Tendons Grossly intact. Soft tissues Vascular calcifications of the partially imaged right lower extremity arteries. IMPRESSION: Mildly angulated right femoral transcervical fracture. Electronically Signed   By: Agustin Cree M.D.   On: 10/30/2022 20:49   DG Knee 2 Views Right  Result Date: 10/30/2022 CLINICAL DATA:  Right knee pain, fall EXAM: RIGHT KNEE - 1-2 VIEW COMPARISON:  None Available. FINDINGS: Moderate tricompartment degenerative changes with joint space narrowing and spurring. No joint effusion. No acute bony abnormality. Specifically, no fracture, subluxation, or  dislocation. IMPRESSION: Moderate degenerative changes.  No acute bony abnormality. Electronically Signed   By: Charlett Nose M.D.   On: 10/30/2022 20:25   DG Hip Unilat W or Wo Pelvis 2-3 Views Right  Result Date: 10/30/2022 CLINICAL DATA:  Fall, right hip pain EXAM: DG HIP (WITH OR WITHOUT PELVIS) 2-3V RIGHT COMPARISON:  None Available. FINDINGS: There is  foreshortening of the right femoral neck with cortical irregularity noted laterally concerning for right femoral neck fracture. No subluxation or dislocation. Mild symmetric degenerative changes in the hips with joint space narrowing and spurring. IMPRESSION: Foreshortening of the right femoral neck with cortical irregularity laterally concerning for femoral neck fracture. Electronically Signed   By: Charlett Nose M.D.   On: 10/30/2022 20:24   DG Chest Portable 1 View  Result Date: 10/30/2022 CLINICAL DATA:  Fall, right hip pain EXAM: PORTABLE CHEST 1 VIEW COMPARISON:  09/14/2022 FINDINGS: Left AICD remains in place, unchanged. Heart is normal size. Mediastinal contours within normal limits. No confluent opacities or effusions. No acute bony abnormality. IMPRESSION: No active disease. Electronically Signed   By: Charlett Nose M.D.   On: 10/30/2022 20:22   CT HEAD WO CONTRAST ( )  Result Date: 10/30/2022 CLINICAL DATA:  Trip and fall injury. EXAM: CT HEAD WITHOUT CONTRAST CT CERVICAL SPINE WITHOUT CONTRAST TECHNIQUE: Multidetector CT imaging of the head and cervical spine was performed following the standard protocol without intravenous contrast. Multiplanar CT image reconstructions of the cervical spine were also generated. RADIATION DOSE REDUCTION: This exam was performed according to the departmental dose-optimization program which includes automated exposure control, adjustment of the mA and/or kV according to patient size and/or use of iterative reconstruction technique. COMPARISON:  Head CT without contrast 04/17/2022, cervical spine CT 03/27/2022. FINDINGS: CT HEAD FINDINGS Brain: There is mild atrophy, small-vessel disease and atrophic ventriculomegaly. No new asymmetry is seen concerning for an acute infarct, hemorrhage or mass. There is no midline shift. The basal cisterns are clear. Vascular: There are patchy calcifications of the carotid siphons but no hyperdense central vessels. Skull: No  fracture or focal lesions.  Mild calvarial osteopenia. Sinuses/Orbits: No acute findings. Clear visualized sinuses and mastoid air cells. Negative orbits apart from old lens replacements. Other: None. CT CERVICAL SPINE FINDINGS Alignment: Unchanged. There is a trace 2 mm C3-4 retrolisthesis, similar minimal C4-5 anterolisthesis and trace C5-6 and C6-7 retrolisthesis all believed degenerative and all unchanged. No traumatic listhesis is seen. There is bone-on-bone anterior atlantodental joint space loss with osteophytes, and degenerative cystic changes in the odontoid process. Skull base and vertebrae: No acute fracture is evident. Bone mineralization is osteopenic. No primary bone lesion or focal pathologic process is seen. Soft tissues and spinal canal: No prevertebral fluid or swelling. No visible canal hematoma. There are calcifications in both proximal cervical ICAs, heaviest on the left where there is probably a flow-limiting origin stenosis. No laryngeal or thyroid mass. Disc levels: There is moderate disc space loss again at C3-4, C5-6 and C6-7, mild disc narrowing at C3-4, normal disc heights at C2-3 and C7-T1. There are small bidirectional endplate spurs from C3-4 through C6-7, causing partial effacement of the ventral CSF without spondylotic cord compression. Facet joint spurring on the left-greater-than-right is seen at most levels with bilateral uncinate spurring. Acquired foraminal stenosis is mild on the right at C2-3, severe on the left and moderate on the right at C3-4, bilaterally mild-to-moderate C6-7, and not seen at the remaining levels. Upper chest: Pacemaker wiring is partially visible entering from the  left. Otherwise negative. Other: None. IMPRESSION: 1. No acute intracranial CT findings or depressed skull fractures. 2. Osteopenia and degenerative change without evidence of cervical fractures or traumatic listhesis. 3. 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 Follow-up as indicated. Electronically Signed   By: Almira Bar M.D.   On: 10/30/2022 20:15   CT Cervical Spine Wo Contrast  Result Date: 10/30/2022 CLINICAL DATA:  Trip and fall injury. EXAM: CT HEAD WITHOUT CONTRAST CT CERVICAL SPINE WITHOUT CONTRAST TECHNIQUE: Multidetector CT imaging of the head and cervical spine was performed following the standard protocol without intravenous contrast. Multiplanar CT image reconstructions of the cervical spine were also generated. RADIATION DOSE REDUCTION: This exam was performed according to the departmental dose-optimization program which includes automated exposure control, adjustment of the mA and/or kV according to patient size and/or use of iterative reconstruction technique. COMPARISON:  Head CT without contrast 04/17/2022, cervical spine CT 03/27/2022. FINDINGS: CT HEAD FINDINGS Brain: There is mild atrophy, small-vessel disease and atrophic ventriculomegaly. No new asymmetry is seen concerning for an acute infarct, hemorrhage or mass. There is no midline shift. The basal cisterns are clear. Vascular: There are patchy calcifications of the carotid siphons but no hyperdense central vessels. Skull: No fracture or focal lesions.  Mild calvarial osteopenia. Sinuses/Orbits: No acute findings. Clear visualized sinuses and mastoid air cells. Negative orbits apart from old lens replacements. Other: None. CT CERVICAL SPINE FINDINGS Alignment: Unchanged. There is a trace 2 mm C3-4 retrolisthesis, similar minimal C4-5 anterolisthesis and trace C5-6 and C6-7 retrolisthesis all believed degenerative and all unchanged. No traumatic listhesis is seen. There is bone-on-bone anterior atlantodental joint space loss with osteophytes, and degenerative cystic changes in the odontoid process. Skull base and vertebrae: No acute fracture is evident. Bone mineralization is osteopenic. No primary bone lesion or focal pathologic process is seen. Soft tissues and  spinal canal: No prevertebral fluid or swelling. No visible canal hematoma. There are calcifications in both proximal cervical ICAs, heaviest on the left where there is probably a flow-limiting origin stenosis. No laryngeal or thyroid mass. Disc levels: There is moderate disc space loss again at C3-4, C5-6 and C6-7, mild disc narrowing at C3-4, normal disc heights at C2-3 and C7-T1. There are small bidirectional endplate spurs from C3-4 through C6-7, causing partial effacement of the ventral CSF without spondylotic cord compression. Facet joint spurring on the left-greater-than-right is seen at most levels with bilateral uncinate spurring. Acquired foraminal stenosis is mild on the right at C2-3, severe on the left and moderate on the right at C3-4, bilaterally mild-to-moderate C6-7, and not seen at the remaining levels. Upper chest: Pacemaker wiring is partially visible entering from the left. Otherwise negative. Other: None. IMPRESSION: 1. No acute intracranial CT findings or depressed skull fractures. 2. Osteopenia and degenerative change without evidence of cervical fractures or traumatic listhesis. 3. 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 Follow-up as indicated. Electronically Signed   By: Almira Bar M.D.   On: 10/30/2022 20:15   CT Angio Chest PE W/Cm &/Or Wo Cm  Result Date: 10/15/2022 CLINICAL DATA:  Chest pain. EXAM: CT ANGIOGRAPHY CHEST WITH CONTRAST TECHNIQUE: Multidetector CT imaging of the chest was performed using the standard protocol during bolus administration of intravenous contrast. Multiplanar CT image reconstructions and MIPs were obtained to evaluate the vascular anatomy. RADIATION DOSE REDUCTION: This exam was performed according to the departmental dose-optimization program which includes automated exposure control, adjustment of the  mA and/or kV according to patient size and/or use of iterative reconstruction technique.  CONTRAST:  60mL OMNIPAQUE IOHEXOL 350 MG/ML SOLN COMPARISON:  May 11, 2022. FINDINGS: Cardiovascular: Satisfactory opacification of the pulmonary arteries to the segmental level. No evidence of pulmonary embolism. Normal heart size. No pericardial effusion. Coronary artery calcifications are noted. Mediastinum/Nodes: No enlarged mediastinal, hilar, or axillary lymph nodes. Thyroid gland, trachea, and esophagus demonstrate no significant findings. Lungs/Pleura: No pneumothorax or pleural effusion is noted. Stable 6 mm nodule is noted in left lower lobe best seen on image number 83 of series 5. 1 cm part solid nodule is noted in right upper lobe best seen on image number 58 of series 5, with 4 mm solid nodule. Minimal right posterior basilar subsegmental atelectasis or scarring is noted. Stable 5 mm nodule is noted in right lower lobe best seen on image number 90 of series 5. Upper Abdomen: No acute abnormality. Musculoskeletal: No chest wall abnormality. No acute or significant osseous findings. Review of the MIP images confirms the above findings. IMPRESSION: No definite evidence of pulmonary embolus. Interval development of 1 cm part solid nodule in right upper lobe. Follow-up non-contrast CT recommended at 3-6 months to confirm persistence. If unchanged, and solid component remains <6 mm, annual CT is recommended until 5 years of stability has been established. If persistent these nodules should be considered highly suspicious if the solid component of the nodule is 6 mm or greater in size and enlarging. This recommendation follows the consensus statement: Guidelines for Management of Incidental Pulmonary Nodules Detected on CT Images: From the Fleischner Society 2017; Radiology 2017; 284:228-243. Stable bilateral lower lobe nodules are noted, the largest measuring 6 mm. Attention to these on follow-up imaging is recommended. Coronary artery calcifications are noted. Aortic Atherosclerosis (ICD10-I70.0) and  Emphysema (ICD10-J43.9). Electronically Signed   By: Lupita Raider M.D.   On: 10/15/2022 09:29    ECHO 04/21/2022: 1. Left ventricular ejection fraction, by estimation, is 30 to 35%. The left ventricle has moderately decreased function. The left ventricle demonstrates global hypokinesis. The left ventricular internal cavity size was mildly to moderately dilated.  Left ventricular diastolic parameters are consistent with Grade III diastolic dysfunction (restrictive).   2. Right ventricular systolic function is low normal. The right  ventricular size is mildly enlarged.   3. Left atrial size was moderately dilated.   4. Right atrial size was mild to moderately dilated.   5. The mitral valve is normal in structure. Trivial mitral valve  regurgitation.   6. The aortic valve is normal in structure. Aortic valve regurgitation is not visualized. Aortic valve sclerosis/calcification is present, without any evidence of aortic stenosis.   TELEMETRY reviewed by me [facility]) 11/05/2022 : A-fib with RVR with rates to the upper 130s throughout this morning, noted multiple episodes of VT.  Prolonged episode of VT/VF at 1212 which resulted in ICD shock. Now in NSR in the 60-70s with PACs and PVCs after ICD shock  EKG reviewed by me: EKG following his ICD shock today has not loaded into Epic and paper copy was not available on the unit for me to review. EKG from 5/19 reviewed and shows atrial fibrillation with PVCs with rapid rate 138 bpm.  Data reviewed by me [facility]) 11/05/2022: ortho progress notes, hospitalist progress notes, admission H&P, last 24h vitals tele labs imaging I/O   Principal Problem:   Femoral fracture ([name]) Active Problems:   Paroxysmal atrial fibrillation ([name])   Acute on chronic combined systolic and  diastolic CHF (congestive heart failure) ([name])   Hypotension   CKD (chronic kidney disease), stage IIIb   GERD without esophagitis   COPD (chronic obstructive pulmonary disease) ([name])    Normocytic anemia   Osteoporosis   Closed displaced fracture of right femoral neck ([name])    ASSESSMENT AND PLAN:  Matthew Zamora is a 82 y.o. male who presented to the ED on 10/30/2022 for hip pain. 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  Upon workup in the ED patient was found to have hip fracture and subsequently was taken from surgical repair on 10/31/2022.  Postoperatively he was noted to be in atrial fibrillation with rates in the 100-110s, home metoprolol was restarted.  He was noted to have a cough yesterday, chest x-ray demonstrated edema and he was given IV Lasix 20 mg x 1. Today he was noted to have heart rates elevated to the 130-140s. Cardiology was consulted for further evaluation and management of A-fib with RVR. After initial consult patient was noted to have episode of VT with subsequent ICD shock.   # Ventricular Tachycardia/Fibrillation s/p ICD shock 11/05/2022 # Ischemic Cardiomyopathy s/p [employer] ICD 2009 # CAD s/p PCI and DES to RCA in 2019 -Patient hemodynamically stable and A&O x3 after ICD shock this afternoon.  -Start amiodarone IV load > infusion. Plan for total load of 5-6 grams. Continue telemetry monitoring for recurrent VT/VF. Patient to be moved to 2A. -ICD interrogation reports a 22-second episode of ventricular fibrillation today with a ventricular rate of 207 bpm shocked with 25 J x 2 as well as an additional episode of V-fib lasting for 4 seconds that was not shocked. Interrogation document placed in patient's chart on the unit. -Thyroid function normal 5/16, LFTs normal 5/16 -BNP today 806. Most recent echo from 04/2022 with EF 30-35%. S/p IV Lasix 20 mg x1 yesterday. -Continue Lipitor 80 mg daily and Aspirin 81 mg daily for secondary  prevention.   # Atrial Fibrillation RVR -Appears improved on telemetry after ICD shock, continue to monitor.  -Continue Eliquis 2.5 mg twice daily (reduced dose appropriate given age and Cr 1.51) -Continue Metoprolol 12.5 mg twice daily.   # Chronic Kidney Disease Stage IIIb -Last Cr 1.51, continue to monitor closely.    This patient's plan of care was discussed and created with Dr. Juliann Pares and he is in agreement.  Signed: Cheryl Flash, PA-C 11/05/2022, 3:09 PM [facility]

## 2022-11-05 NOTE — Plan of Care (Signed)
  Problem: Health Behavior/Discharge Planning: Goal: Ability to manage health-related needs will improve Outcome: Progressing   Problem: Clinical Measurements: Goal: Diagnostic test results will improve Outcome: Progressing Goal: Respiratory complications will improve Outcome: Progressing Goal: Cardiovascular complication will be avoided Outcome: Progressing   Problem: Activity: Goal: Risk for activity intolerance will decrease Outcome: Progressing   Problem: Nutrition: Goal: Adequate nutrition will be maintained Outcome: Progressing   Problem: Coping: Goal: Level of anxiety will decrease Outcome: Progressing   Problem: Pain Managment: Goal: General experience of comfort will improve Outcome: Progressing

## 2022-11-05 NOTE — Evaluation (Addendum)
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 pylori infection    History of hepatitis    Hyperlipidemia    Hypertension    Liver cyst 05/16/15   PAF (paroxysmal atrial fibrillation) ([name])    Prostate CA [facility])    Past Surgical History:  Past Surgical History:  Procedure Laterality Date   CATARACT EXTRACTION     COLONOSCOPY  10/06/2014, 09/18/2004, 03/14/2009   ESOPHAGOGASTRODUODENOSCOPY  10/06/2014, 03/18/2001, 03/14/2009   ESOPHAGOGASTRODUODENOSCOPY (EGD) WITH PROPOFOL N/A 05/07/2018   Procedure: ESOPHAGOGASTRODUODENOSCOPY (EGD) WITH PROPOFOL;  Surgeon: Toledo, Boykin Nearing, MD;  Location: [facility] ENDOSCOPY;  Service: Gastroenterology;  Laterality: N/A;   ESOPHAGOGASTRODUODENOSCOPY (EGD) WITH PROPOFOL N/A 04/22/2022   Procedure: ESOPHAGOGASTRODUODENOSCOPY (EGD) WITH PROPOFOL;  Surgeon: Toney Reil, MD;  Location: [facility] ENDOSCOPY;  Service: Endoscopy;  Laterality: N/A;   FLEXIBLE SIGMOIDOSCOPY  08/26/1990   HIP ARTHROPLASTY Right 10/31/2022   Procedure: ARTHROPLASTY BIPOLAR HIP (HEMIARTHROPLASTY)-RNFA;  Surgeon: Reinaldo Berber, MD;  Location: [facility] ORS;  Service: Orthopedics;  Laterality: Right;   INSERTION OF ICD     PROSTATE SURGERY     TONSILLECTOMY     HPI:  Pt is a 82 y.o. male with medical history significant of Barrett's Esophagus, Hiatal Hernia, CHF, PAF,  CAD, hepatitis, Chronic hoarseness, COPD with a chronic 2 L/min of supplemental oxygen use at home.  Also endorsed frequent Belching at home -- suspect REFLUX issues baseline. Per EGD in 2023, Hiatal Hernia, Esophagitits.  Patient seems to be at his usual baseline till approximately 3 PM today.  At the time patient had done showering and was in the kitchen area where his laundry is and was trying to get on her shirt.  Unfortunately patient turned in "haste "and stuck his right foot behind his left leg and fell onto the floor.  Patient denies any focal weakness or any vertigo or presyncope or tremors.  Patient had immediate right hip.  Dx: now 5 Days Post-Op Procedure(s) (LRB):  ARTHROPLASTY BIPOLAR HIP (HEMIARTHROPLASTY)-RNFA (Right).    CXR: Vascular congestion with question trace edema.  Pt is on a PPI.    Assessment / Plan / Recommendation  Clinical Impression   Pt seen for BSE today. Pt awake, verbal but w/ fairly frequent tangential thoughts/responses noted. He required verbal cues for follow through w/ tasks and initially insisted on being fed -- "you are not nice like the other Nurse.... she feeds me; that's why I got married 3 times". Pt exhibited a congested cough at rest and w/ movement in bed. On [place] O2 2L(as at home); afebrile; WBC WNL.    Pt appears to present w/ grossly functional pharyngeal phase swallowing w/ Mild oral phase dysphagia/lengthy oral phase time noted during trials of increased texture. No immediate pharyngeal phase dysphagia noted, No overt neuromuscular deficits noted. Pt consumed po trials w/ No immediate, overt clinical s/s of  aspiration during po trials.  Pt appears at reduced risk for aspiration following general aspiration precautions. However, pt does have challenging factors that could impact oropharyngeal swallowing to include some discomfort when moving around in bed(s/p hip fx repair, NSG aware), deconditioning/weakness, Esophageal phase Dysmotility including  Barrett's Esophagus and Hiatal Hernia(ANY Esophageal phase Dysmotility can increase risk for aspiration of REFLUX material thus pulmonary decline), and recent Fall w/ fx/surgery. Pt is often requesting support w/ feeding vs feeding self and requires cues to sit fully upright for oral intake. Unsure of his Baseline Cognitive functioning(see prior Head CT). These factors can increase risk for dysphagia/aspiration as well as decreased oral intake overall. Noted pt accepting Minimal po trials w/ this SLP; NSG reported same as well as need to Crush meds in Puree last evening d/t increased Confusion.   During po trials, pt consumed all consistencies w/ no immediate, overt coughing, decline in vocal quality, or change in respiratory presentation during/post trials(thins via Cup only). O2 sats remained in upper 90s when checked. Oral phase appeared grossly [facility] w/ timely bolus management and control of most bolus propulsion for A-P transfer for swallowing. However, noted much increased oral phase time for mastication of increased textured foods; more anterior munching. W/ time, he orally cleared mouth of food food boluses. Moistened, soft foods given as well as alternating w/ sips of liquids intermittently to aid clearing.   OF NOTE: consistent BELCHING noted w/ po intake.   OM Exam appeared [facility] w/ no unilateral weakness noted. Speech Clear. Pt fed self w/ setup support of tray and w/ verbal encouragement.   Recommend a more mech soft/regular consistency diet w/ well-Cut meats, moistened foods for ease of chewing; Thin liquids -- Cup Only and pt should help to [employer] when drinking. No STraws. Recommend general aspiration precautions, reduce distractions during meals. Tray setup and positioning for pt. STRICT REFLUX PRECAUTIONS. Pills WHOLE vs CRUSHED in Puree for safer, easier swallowing now and for D/C. Recommend f/u w/ GI for management as pt is at risk for aspiration of REFLUX material.  Education given on Pills  in Puree; food consistencies and easy to eat options; general aspiration precautions to pt and NSG. NSG agreed. MD to reconsult if any new needs arise. MD/NSG updated, agreed. Recommend Dietician f/u for support. SLP Visit Diagnosis: Dysphagia, oral phase (R13.11) (mild)    Aspiration Risk  Mild aspiration risk;Risk for inadequate nutrition/hydration (reduced following general aspiration precautions)    Diet Recommendation    a more mech soft/regular consistency diet w/ well-Cut meats, moistened foods for ease of chewing; Thin liquids -- Cup Only and pt should help to [employer] when drinking. Recommend general aspiration precautions, reduce distractions during meals. Tray setup and positioning for pt. STRICT REFLUX PRECAUTIONS.   Medication Administration: Whole meds with puree (vs need to Crush in puree)    Other  Recommendations Recommended Consults: Consider GI evaluation;Consider esophageal assessment (Neurology f/t for formal Cognitive assessment to establish baseline) Oral Care Recommendations: Oral care BID;Oral care before and after PO;Staff/trained caregiver to provide oral care (support pt)    Recommendations for follow up therapy are one component of a multi-disciplinary discharge planning process, led by the attending physician.  Recommendations may be updated based on patient status, additional functional criteria and insurance authorization.  Follow up Recommendations Follow physician's recommendations for discharge plan and follow up therapies (next venue of care)      Assistance Recommended at Discharge  INtermittent  Functional Status Assessment Patient has had  a recent decline in their functional status and/or demonstrates limited ability to make significant improvements in function in a reasonable and predictable amount of time  Frequency and Duration  (n/a)   (n/a)       Prognosis Prognosis for improved oropharyngeal function: Fair Barriers to Reach Goals: Cognitive  deficits;Time post onset;Severity of deficits;Behavior Barriers/Prognosis Comment: home O2; suspect baseline REFLUX issues/GERD - has hiatal hernia and consistent Belching. Suspect Cognitive decline.      Swallow Study   General Date of Onset: 10/30/22 HPI: Pt is a 82 y.o. male with medical history significant of CHF, PAF, CAD, hepatitis, Chronic hoarseness, COPD with a chronic 2 L/min of supplemental oxygen use at home.  Also endorsed frequent Belching at home -- suspect REFLUX issues baseline. Per EGD in 2023, Hiatal Hernia, Esophagitits.  Patient seems to be at his usual baseline till approximately 3 PM today.  At the time patient had done showering and was in the kitchen area where his laundry is and was trying to get on her shirt.  Unfortunately patient turned in "haste "and stuck his right foot behind his left leg and fell onto the floor.  Patient denies any focal weakness or any vertigo or presyncope or tremors.  Patient had immediate right hip.  Dx: now 5 Days Post-Op Procedure(s) (LRB):  ARTHROPLASTY BIPOLAR HIP (HEMIARTHROPLASTY)-RNFA (Right).    CXR: Vascular congestion with question trace edema. Type of Study: Bedside Swallow Evaluation Previous Swallow Assessment: 2020; 2019 - thins, chopped foods Diet Prior to this Study: Regular;Thin liquids (Level 0) (doesn't eat much) Temperature Spikes Noted: No (wbc 4.2) Respiratory Status: Nasal cannula (2L -- same as at home) History of Recent Intubation: No Behavior/Cognition: Alert;Cooperative;Pleasant mood;Confused;Distractible;Requires cueing (tangential) Oral Cavity Assessment: Dry (min) Oral Care Completed by SLP: Yes Oral Cavity - Dentition: Adequate natural dentition Vision: Functional for self-feeding Self-Feeding Abilities: Able to feed self;Needs assist;Needs set up (wanted to be fed) Patient Positioning: Upright in bed (needed positioning support) Baseline Vocal Quality: Normal Volitional Cough: Strong;Congested Volitional  Swallow: Able to elicit    Oral/Motor/Sensory Function Overall Oral Motor/Sensory Function: Within functional limits   Ice Chips Ice chips: Within functional limits Presentation: Spoon (fed; 2 trials)   Thin Liquid Thin Liquid: Within functional limits Presentation: Cup;Self Fed (~4 ozs total) Other Comments: water, OJ, coffee -- half-cough intermittently b/t trials but not immediate to the trial/swallow. Did not increase as trials continued.    Nectar Thick Nectar Thick Liquid: Not tested   Honey Thick Honey Thick Liquid: Not tested   Puree Puree: Within functional limits Presentation: Self Fed;Spoon (6 trials) Other Comments: encouraged   Solid     Solid: Impaired (min) Presentation: Self Fed (2 pieces of bacon accepted) Oral Phase Impairments: Impaired mastication (more anterior munching) Oral Phase Functional Implications: Prolonged oral transit;Impaired mastication Pharyngeal Phase Impairments:  (none)        Matthew Som, MS, CCC-SLP Speech Language Pathologist Rehab Services; [facility] [place] [phone] (ascom) Ahlani Wickes 11/05/2022,1:23 PM

## 2022-11-05 NOTE — Progress Notes (Signed)
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  Hemodynamically stable in the emergency room.  Admitted with orthopedic consultation.  Last dose of Eliquis 5/15 afternoon.   5/16 - underwent Right hip hemiarthroplasty 5/19 - recurrent a-fib mild RVR with HR in 100-110's, occasionally > 120. Resumed low dose metoprolol. 5/20 - HR's on tele 90's to 110's.  Chest xray to evaluate cough shows edema.  Lasix 20 mg IV x 1.   Assessment & Plan:   Closed traumatic right femoral fracture: Right hip hemiarthroplasty Dr. Audelia Acton 5/16. Weightbearing as tolerated on LLE Adequate pain medications, currently managed with oral pain medication along with bowel regimen. Eliquis 2.5 mg twice daily for DVT prophylaxis, taking before procedure. Awaiting SNF placement for rehab. Follow up at [facility] in 2 weeks.  Paroxysmal A-fib with RVR: was in normal sinus earlier admission.   5/19: HR's in 100's to 110's, occasionally > 120.  EKG shows A-fib. 5/20: telemetry with HR's 90's to 110's, A-fib 5/21: HR's up to 130's on tele.  ICD shocked pt.  --[facility] Cardiology consulted --Transfer to 2A for amiodarone gtt --ICD interrogation today - pending --Continue telemetry --Continue Eliquis --Started low dose PO metoprolol 12.5 mg BID (5/19) (previously admission, rate control agent of antiarrhythmic had to be stopped due to hypotension) Previously on amiodarone and mexilitene, d/c'd in November due to debilitating dizziness and orthostatic hypotension.    Acute on Chronic Combined Systolic/Diastolic CHF - acute decompensation seems driven by  A-fib RVR. Last Echo 04/2022 - EF 30-35%, grade III diastolic dysfunction BNP elevated > 800.  Given Lasix 20 mg IV x 1 on 5/20. --Repeat Lasix 20 mg IV this AM --Cardiology now following - will defer diuresis needs to them --Daily weights and strick I/O's --Monitor electrolytes and renal function  Cough - will obtain chest xray to evaluate for pneumonia or edema. CXR with increased vascular congestion.  BNP > 800, procal 0.13. --Mucinex --Diuresis as above --Flutter valve, IS --SLP for swallow evaluation   Normocytic anemia / Iron deficiency Hbg after surgery trended down to nadir of 7.9 (from 9.6).   Iron studies consistent with iron deficiency.  Status post IV iron infusion. No active bleeding signs. Hbg trend 7.9 >> 8.7 >> 8.2  --Monitor CBC  Chronic hypotension, a.m. cortisol adequate.   --Continue home midodrine  History of coronary artery disease, stable on aspirin and Eliquis.  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  Stable, not wheezing, O2 requirement stable.  Continue bronchodilator therapy.  CKD stage IIIb: History of baseline creatinine about 1.4-1.5.   Cr improved 1.28 from 1.51 yesterday after given IV Lasix. Get's mildly hyperkalemic at times, stable K past several days. Monitor BMP.   DVT prophylaxis: apixaban (ELIQUIS) tablet 2.5 mg Start: 11/01/22 2000 SCDs Start: 10/31/22 1723 SCDs Start: 10/30/22 2144 apixaban (ELIQUIS) tablet 2.5 mg   Code Status: Full code Family Communication: Son and daughter-in-law updated at bedside 5/19, Updated [name], daughter-in-law by phone this afternoon 5/21.   Disposition Plan:  SNF  Status is: Inpatient Remains inpatient appropriate because: Inpatient surgery planned     Consultants:  Orthopedics  Procedures:  ORIF right hip.  Antimicrobials:  None   Subjective:  Pt awake sitting up in bed this AM. He reports feeling short of breath, feeling like he can't catch  his breath. Continues to have some cough and breathing at times sounds coarse in the throat.  He denies feeling ill, fever/chills, or feeling run down.  Doesn't feel sick, just not breathing very well.  Later this afternoon, pt notified RN his ICD shocked him.  Cardiology is consulted and starting amiodarone.   Objective: Vitals:   11/05/22 1200 11/05/22 1217 11/05/22 1221 11/05/22 1232  BP:  (!) 145/82    Pulse: (!) 133 (!) 113 (!) 111 91  Resp:  18    Temp:      TempSrc:      SpO2:  96%    Weight:      Height:        Intake/Output Summary (Last 24 hours) at 11/05/2022 1331 Last data filed at 11/05/2022 1222 Gross per 24 hour  Intake --  Output 1300 ml  Net -1300 ml    Filed Weights   10/30/22 1912 10/31/22 1210  Weight: 59.9 kg 61.2 kg    Examination:  General exam: awake, alert, no acute distress, frail HEENT: moist mucus membranes, hearing grossly normal  Respiratory system: coarse upper airway secretion sounds, increased respiratory effort with accessory muscle use, on 2 L/min O2 (baseline). Cardiovascular system: irregularly irregular, intact pedal pulses, no edema.   Gastrointestinal system: soft, NT, ND, no HSM felt, +bowel sounds. Central nervous system: A&O x 2+. no gross focal neurologic deficits, normal speech Skin: dry, intact, normal temperature Psychiatry: anxious mood, congruent affect, confused    Data Reviewed: I have personally reviewed following labs and imaging studies  CBC: Recent Labs  Lab 10/30/22 1938 10/31/22 0611 11/01/22 0910 11/02/22 0456 11/03/22 0433 11/04/22 0517 11/05/22 0830  WBC 8.9   < > 11.4* 8.2 6.8 6.7 4.2  NEUTROABS 7.4  --   --   --   --   --   --   HGB 10.1*   < > 9.6* 8.7* 7.9* 8.7* 8.2*  HCT 32.1*   < > 30.5* 27.1* 24.7* 27.7* 25.9*  MCV 93.9   < > 93.0 91.2 91.5 93.0 92.2  PLT 180   < > 103* 107* 100* 123* 109*   < > = values in this interval not displayed.   Basic Metabolic Panel: Recent Labs  Lab  10/31/22 0611 10/31/22 1046 11/01/22 0702 11/02/22 0456 11/03/22 0433 11/03/22 0437 11/04/22 0517  NA 135   < > 134* 132* 134* 134* 136  K 5.2*   < > 5.3* 4.4 4.5 4.7 4.3  CL 101   < > 106 105 107 107 106  CO2 27   < > 20* 22 23 22 24   GLUCOSE 98   < > 89 85 78 77 96  BUN 18   < > 18 20 18 19 20   CREATININE 1.62*   < > 1.67* 1.72* 1.47* 1.50* 1.51*  CALCIUM 9.1   < > 8.4* 8.4* 8.7* 8.8* 9.2  MG  --   --   --   --   --   --  1.9  PHOS 4.1  --   --   --   --  2.9  --    < > = values in this interval not displayed.   GFR: Estimated Creatinine Clearance: 33.8 mL/min (A) (by C-G formula based on SCr  of 1.51 mg/dL (H)). Liver Function Tests: Recent Labs  Lab 10/31/22 0611 11/03/22 0437  AST 24  --   ALT 19  --   ALKPHOS 71  --   BILITOT 0.8  --   PROT 6.5  --   ALBUMIN 3.6 2.6*   No results for input(s): "LIPASE", "AMYLASE" in the last 168 hours. No results for input(s): "AMMONIA" in the last 168 hours. Coagulation Profile: Recent Labs  Lab 10/31/22 0611  INR 1.2   Cardiac Enzymes: No results for input(s): "CKTOTAL", "CKMB", "CKMBINDEX", "TROPONINI" in the last 168 hours. BNP (last 3 results) No results for input(s): "PROBNP" in the last 8760 hours. HbA1C: No results for input(s): "HGBA1C" in the last 72 hours. CBG: Recent Labs  Lab 11/01/22 0834 11/01/22 1628  GLUCAP 93 89   Lipid Profile: No results for input(s): "CHOL", "HDL", "LDLCALC", "TRIG", "CHOLHDL", "LDLDIRECT" in the last 72 hours. Thyroid Function Tests: No results for input(s): "TSH", "T4TOTAL", "FREET4", "T3FREE", "THYROIDAB" in the last 72 hours.  Anemia Panel: Recent Labs    11/04/22 0517  VITAMINB12 759  FOLATE 13.1  FERRITIN 368*  TIBC 211*  IRON 15*  RETICCTPCT 1.2   Sepsis Labs: Recent Labs  Lab 11/05/22 0830  PROCALCITON 0.13    No results found for this or any previous visit (from the past 240 hour(s)).       Radiology Studies: DG Chest Port 1 View  Result Date:  11/04/2022 CLINICAL DATA:  Cough EXAM: PORTABLE CHEST 1 VIEW COMPARISON:  X-ray 10/30/2022 FINDINGS: Underinflation compared to the prior x-ray. Stable cardiopericardial silhouette with calcified aorta. Left upper chest defibrillator with leads overlying the right side of the heart. Vascular congestion. No pneumothorax or effusion. Question trace edema. No separate consolidation IMPRESSION: Vascular congestion with question trace edema. Defibrillator. Decreased inflation compared to prior x-ray.  Recommend follow-up Electronically Signed   By: Karen Kays M.D.   On: 11/04/2022 10:38        Scheduled Meds:  (feeding supplement) PROSource Plus  30 mL Oral TID BM   acetaminophen  1,000 mg Oral Q8H   apixaban  2.5 mg Oral BID   aspirin  81 mg Oral Q1200   atorvastatin  80 mg Oral Daily   calcium carbonate  750 mg Oral Daily   dextromethorphan-guaiFENesin  1 tablet Oral BID   docusate sodium  100 mg Oral BID   feeding supplement  237 mL Oral BID BM   gabapentin  100 mg Oral QHS   ipratropium-albuterol  3 mL Nebulization Q8H   melatonin  10 mg Oral QHS   metoprolol tartrate  12.5 mg Oral BID   midodrine  15 mg Oral TID WC   mometasone-formoterol  2 puff Inhalation BID   pantoprazole  40 mg Oral Daily   polyethylene glycol  17 g Oral BID   pyridostigmine  60 mg Oral Q8H   sertraline  25 mg Oral Daily   sodium chloride flush  3 mL Intravenous Q12H   sucralfate  1 g Oral TID WC & HS   tamsulosin  0.4 mg Oral Daily   Continuous Infusions:  sodium chloride     magnesium sulfate bolus IVPB       LOS: 6 days    Time spent: 55 minutes including time at bedside and coordinating care    Pennie Banter, DO [facility] Pager [phone]

## 2022-11-05 NOTE — Progress Notes (Signed)
PT Cancellation Note  Patient Details Name: Matthew Zamora MRN: [id number] DOB: 05/10/1941   Cancelled Treatment:    Reason Eval/Treat Not Completed: Medical issues which prohibited therapy (Chart reviewed for planned treatment session. Per care team, patient with episode of abnormal cardiac rhythm this AM, s/p shock from AICD; pending transfer to another unit for additional monitoring. Will hold at this time and re-attempt as medically appropriate.)  Marwin Primmer H. Manson Passey, PT, DPT, NCS 11/05/22, 2:06 PM [phone]

## 2022-11-05 NOTE — TOC Progression Note (Signed)
Transition of Care [facility]) - Progression Note    Patient Details  Name: Matthew Zamora MRN: [id number] Date of Birth: March 12, 1941  Transition of Care [facility]) CM/SW Contact  [name], RN Phone Number: 11/05/2022, 1:54 PM  Clinical Narrative:   patient is having ongoing needs medically and will not be discharged just yet, plan [facility] once medically stable, may need to expand the Ins approval that expires tomorrow         Expected Discharge Plan and Services                                               Social Determinants of Health (SDOH) Interventions SDOH Screenings   Food Insecurity: No Food Insecurity (10/30/2022)  Housing: Low Risk  (10/30/2022)  Transportation Needs: No Transportation Needs (10/30/2022)  Utilities: Not At Risk (10/30/2022)  Tobacco Use: Medium Risk (11/01/2022)    Readmission Risk Interventions    04/22/2022    3:54 PM  Readmission Risk Prevention Plan  Transportation Screening Complete  Medication Review (RN Care Manager) Complete  PCP or Specialist appointment within 3-5 days of discharge Complete  HRI or Home Care Consult Complete  SW Recovery Care/Counseling Consult Complete  Palliative Care Screening Not Applicable  Skilled Nursing Facility Not Applicable

## 2022-11-05 NOTE — Progress Notes (Signed)
Initial Nutrition Assessment  DOCUMENTATION CODES:   Not applicable  INTERVENTION:  - Add Ensure Enlive po BID, each supplement provides 350 kcal and 20 grams of protein.   - Add Magic cup BID with meals, each supplement provides 290 kcal and 9 grams of protein  -Continue Prosource Plus TID.   NUTRITION DIAGNOSIS:   Inadequate oral intake related to inability to eat, lethargy/confusion as evidenced by meal completion < 25%.  GOAL:   Patient will meet greater than or equal to 90% of their needs  MONITOR:   Supplement acceptance, PO intake  REASON FOR ASSESSMENT:   Consult Assessment of nutrition requirement/status  ASSESSMENT:   82 y.o. male admits related to fall and hip pain. PMH includes: AAA, adenomatous colon polyp, CAD, CHF, PAF, HLD, HTN. Pt is currently receiving medical management related to femoral fracture.  Meds reviewed: lipitor, colace, miralax. Labs reviewed: creatinine high.   SLP called RD office to notify us of pt's poor PO intakes. RD went to speak with pt and pt unable to provide nutrition hx details. RD will add Ensure Enlive BID as well as Magic Cup BID. Pt already has Prosource plus TID ordered. No significant wt loss per record. RD will continue to closely monitor PO intakes.   NUTRITION - FOCUSED PHYSICAL EXAM:  Flowsheet Row Most Recent Value  Orbital Region Severe depletion  Upper Arm Region Unable to assess  Thoracic and Lumbar Region Unable to assess  Buccal Region Moderate depletion  Temple Region Severe depletion  Clavicle Bone Region Moderate depletion  Clavicle and Acromion Bone Region Moderate depletion  Scapular Bone Region Unable to assess  Dorsal Hand Mild depletion  Patellar Region Unable to assess  Anterior Thigh Region Unable to assess  Posterior Calf Region Unable to assess  Edema (RD Assessment) None  Hair Reviewed  Eyes Reviewed  Mouth Reviewed  Skin Reviewed  Nails Reviewed       Diet Order:   Diet Order              Diet regular Room service appropriate? Yes with Assist; Fluid consistency: Thin  Diet effective now                   EDUCATION NEEDS:   Not appropriate for education at this time  Skin:  Skin Assessment: Skin Integrity Issues: Skin Integrity Issues:: Incisions Incisions: right hip  Last BM:  5/19 - type 7  Height:   Ht Readings from Last 1 Encounters:  10/31/22 5\' 8"  (1.727 m)    Weight:   Wt Readings from Last 1 Encounters:  10/31/22 61.2 kg    Ideal Body Weight:     BMI:  Body mass index is 20.53 kg/m.  Estimated Nutritional Needs:   Kcal:  1530-1835 kcals  Protein:  75-90 gm  Fluid:  >/= 1.5 L  Bethann Humble, RD, LDN, CNSC.

## 2022-11-06 DIAGNOSIS — W19XXXA Unspecified fall, initial encounter: Secondary | ICD-10-CM

## 2022-11-06 LAB — CBC
HCT: 22.6 % — ABNORMAL LOW (ref 39.0–52.0)
Hemoglobin: 7.2 g/dL — ABNORMAL LOW (ref 13.0–17.0)
MCH: 28.8 pg (ref 26.0–34.0)
MCHC: 31.9 g/dL (ref 30.0–36.0)
MCV: 90.4 fL (ref 80.0–100.0)
Platelets: 126 10*3/uL — ABNORMAL LOW (ref 150–400)
RBC: 2.5 MIL/uL — ABNORMAL LOW (ref 4.22–5.81)
RDW: 14.3 % (ref 11.5–15.5)
WBC: 2.7 10*3/uL — ABNORMAL LOW (ref 4.0–10.5)
nRBC: 0 % (ref 0.0–0.2)

## 2022-11-06 LAB — BASIC METABOLIC PANEL
Anion gap: 7 (ref 5–15)
BUN: 24 mg/dL — ABNORMAL HIGH (ref 8–23)
CO2: 25 mmol/L (ref 22–32)
Calcium: 8.5 mg/dL — ABNORMAL LOW (ref 8.9–10.3)
Chloride: 105 mmol/L (ref 98–111)
Creatinine, Ser: 1.3 mg/dL — ABNORMAL HIGH (ref 0.61–1.24)
GFR, Estimated: 56 mL/min — ABNORMAL LOW (ref >60)
Glucose, Bld: 97 mg/dL (ref 70–99)
Potassium: 3.5 mmol/L (ref 3.5–5.1)
Sodium: 137 mmol/L (ref 135–145)

## 2022-11-06 LAB — TYPE AND SCREEN
ABO/RH(D): AB NEG
Unit division: 0

## 2022-11-06 LAB — BPAM RBC: Blood Product Expiration Date: 202406032359

## 2022-11-06 LAB — HEMOGLOBIN AND HEMATOCRIT, BLOOD
HCT: 26.1 % — ABNORMAL LOW (ref 39.0–52.0)
Hemoglobin: 8.8 g/dL — ABNORMAL LOW (ref 13.0–17.0)

## 2022-11-06 LAB — PREPARE RBC (CROSSMATCH)

## 2022-11-06 LAB — MAGNESIUM: Magnesium: 1.9 mg/dL (ref 1.7–2.4)

## 2022-11-06 MED ORDER — ASPIRIN 81 MG PO TBEC
81.0000 mg | DELAYED_RELEASE_TABLET | Freq: Every day | ORAL | Status: DC
  Administered 2022-11-06 – 2022-11-07 (×2): 81 mg via ORAL
  Filled 2022-11-06 (×2): qty 1

## 2022-11-06 MED ORDER — MAGNESIUM SULFATE 2 GM/50ML IV SOLN
2.0000 g | Freq: Once | INTRAVENOUS | Status: AC
  Administered 2022-11-06: 2 g via INTRAVENOUS
  Filled 2022-11-06: qty 50

## 2022-11-06 MED ORDER — SODIUM CHLORIDE 0.9% IV SOLUTION: Freq: Once | INTRAVENOUS | Status: AC

## 2022-11-06 MED ORDER — ALBUTEROL SULFATE (2.5 MG/3ML) 0.083% IN NEBU: 2.5000 mg | INHALATION_SOLUTION | RESPIRATORY_TRACT | Status: DC | PRN

## 2022-11-06 MED ORDER — IPRATROPIUM-ALBUTEROL 0.5-2.5 (3) MG/3ML IN SOLN
3.0000 mL | Freq: Two times a day (BID) | RESPIRATORY_TRACT | Status: DC
  Administered 2022-11-06 – 2022-11-07 (×3): 3 mL via RESPIRATORY_TRACT
  Filled 2022-11-06 (×3): qty 3

## 2022-11-06 MED ORDER — POTASSIUM CHLORIDE CRYS ER 20 MEQ PO TBCR
20.0000 meq | EXTENDED_RELEASE_TABLET | Freq: Once | ORAL | Status: AC
  Administered 2022-11-06: 20 meq via ORAL
  Filled 2022-11-06: qty 1

## 2022-11-06 NOTE — TOC Progression Note (Signed)
Transition of Care [facility]) - Progression Note    Patient Details  Name: Matthew Zamora MRN: [id number] Date of Birth: 1941-05-06  Transition of Care [facility]) CM/SW Contact  Truddie Hidden, RN Phone Number: 11/06/2022, 2:59 PM  Clinical Narrative:    Per [employer] portal patient authorization will expire 5/22. Berkley Harvey may be extended until midnight of the next day, 5/23.  MD notified.  Contacted [facility]. Spoke with [employer]. Patient can be accepted tomorrow. Some exceptions for Saturday admissions may be made pending facility DON approval.          Expected Discharge Plan and Services                                               Social Determinants of Health (SDOH) Interventions SDOH Screenings   Food Insecurity: No Food Insecurity (10/30/2022)  Housing: Low Risk  (10/30/2022)  Transportation Needs: No Transportation Needs (10/30/2022)  Utilities: Not At Risk (10/30/2022)  Tobacco Use: Medium Risk (11/01/2022)    Readmission Risk Interventions    04/22/2022    3:54 PM  Readmission Risk Prevention Plan  Transportation Screening Complete  Medication Review (RN Care Manager) Complete  PCP or Specialist appointment within 3-5 days of discharge Complete  HRI or Home Care Consult Complete  SW Recovery Care/Counseling Consult Complete  Palliative Care Screening Not Applicable  Skilled Nursing Facility Not Applicable

## 2022-11-06 NOTE — Progress Notes (Signed)
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  Hemodynamically stable in the emergency room.  Admitted with orthopedic consultation.  Last dose of Eliquis 5/15 afternoon.   5/16 - underwent Right hip hemiarthroplasty 5/19 - recurrent a-fib mild RVR with HR in 100-110's, occasionally > 120. Resumed low dose metoprolol. 5/20 - HR's on tele 90's to 110's.  Chest xray to evaluate cough shows edema.  Lasix 20 mg IV x 1.  Assessment & Plan:   Closed traumatic right femoral fracture: Right hip hemiarthroplasty Dr. Audelia Acton 5/16. Weightbearing as tolerated on LLE. He states that he has not gotten up yet.  Adequate pain medications, currently managed with oral pain medication along with bowel regimen. Eliquis 2.5 mg twice daily for DVT prophylaxis, taking before procedure. Awaiting SNF placement for rehab. Follow up at [facility] in 2 weeks.  Paroxysmal A-fib with RVR: was in normal sinus earlier admission.   5/19: HR's in 100's to 110's, occasionally > 120.  EKG shows A-fib. 5/20: telemetry with HR's 90's to 110's, A-fib 5/21: HR's up to 130's on tele.  ICD shocked pt.  --[facility] Cardiology consulted --Transfer to 2A for amiodarone gtt --Continue telemetry --Continue Eliquis --Started low dose PO metoprolol 12.5 mg BID (5/19) 5/22: remains stable on amiodarone infusion. Asymptomatic.   Acute on Chronic Combined Systolic/Diastolic CHF  CAD- acute decompensation seems driven by A-fib RVR. Last Echo 04/2022 - EF 30-35%, grade III diastolic dysfunction BNP elevated > 800.  Given Lasix 20 mg IV x 1 on 5/20. --Cardiology now  following - will defer diuresis needs to them --Daily weights and strick I/O's --Monitor electrolytes and renal function  Cough - will obtain chest xray to evaluate for pneumonia or edema. CXR with increased vascular congestion.  BNP > 800, procal 0.13. --Mucinex --Diuresis as above --Flutter valve, IS  Normocytic anemia / Iron deficiency Hbg after surgery trended down to nadir of 7.9 (from 9.6).   Iron studies consistent with iron deficiency.  Status post IV iron infusion. No active bleeding signs. Hbg trend 7.9 >> 8.7 >> 8.2>7.2. goal is to transfuse if <8.  - 1 unit pRBCs today  --Monitor CBC  Chronic hypotension, a.m. cortisol adequate.   --Continue home midodrine  COPD and chronic hypoxemia on 2 L oxygen at home: Fairly controlled symptoms.  Stable, not wheezing, O2 requirement stable.  Continue bronchodilator therapy. Chronic cough.   CKD stage IIIb: History of baseline creatinine about 1.4-1.5.  1.3 today - Monitor BMP.  DVT prophylaxis: apixaban (ELIQUIS) tablet 2.5 mg Start: 11/01/22 2000 SCDs Start: 10/31/22 1723 SCDs Start: 10/30/22 2144 apixaban (ELIQUIS) tablet 2.5 mg   Code Status: Full code Family Communication: Son and significant other at bedside  Disposition Plan:  SNF  Status is: Inpatient Remains inpatient appropriate because: pending placement  Consultants:  Orthopedics  Procedures:  ORIF right hip.  Antimicrobials:  None  Subjective: Pt reports feeling well today. Denies SOB but has mild cough. Denies chest pain or palpitations. His right hip is painless at rest but pain with movement and has not been up out of bed yet.   Objective: Vitals:   11/05/22 2150 11/05/22 2340 11/06/22  0410 11/06/22 0503  BP:  (!) 110/58 125/67   Pulse:  69 69   Resp:  20 20   Temp:  98.4 F (36.9 C) 98.3 F (36.8 C)   TempSrc:   Oral   SpO2: 100% 100% 98%   Weight:    78.5 kg  Height:        Intake/Output Summary (Last 24 hours) at 11/06/2022 0859 Last  data filed at 11/06/2022 0200 Gross per 24 hour  Intake 420.91 ml  Output 1150 ml  Net -729.09 ml     Filed Weights   10/30/22 1912 10/31/22 1210 11/06/22 0503  Weight: 59.9 kg 61.2 kg 78.5 kg    Examination:  General exam: awake, alert, no acute distress, frail HEENT: moist mucus membranes, hard of hearing  Respiratory system: normal respiratory effort. CTAB. on 2 L/min O2 (baseline). Cardiovascular system: irregularly irregular, intact pedal pulses, no edema.   Central nervous system: A&O x 3. no gross focal neurologic deficits, normal speech Skin: dry, intact, normal temperature Psychiatry: anxious mood, congruent affect, confused  Data Reviewed: I have personally reviewed following labs and imaging studies  CBC: Recent Labs  Lab 10/30/22 1938 10/31/22 0611 11/02/22 0456 11/03/22 0433 11/04/22 0517 11/05/22 0830 11/06/22 0632  WBC 8.9   < > 8.2 6.8 6.7 4.2 2.7*  NEUTROABS 7.4  --   --   --   --   --   --   HGB 10.1*   < > 8.7* 7.9* 8.7* 8.2* 7.2*  HCT 32.1*   < > 27.1* 24.7* 27.7* 25.9* 22.6*  MCV 93.9   < > 91.2 91.5 93.0 92.2 90.4  PLT 180   < > 107* 100* 123* 109* 126*   < > = values in this interval not displayed.    Basic Metabolic Panel: Recent Labs  Lab 10/31/22 0611 10/31/22 1046 11/03/22 0433 11/03/22 0437 11/04/22 0517 11/05/22 0821 11/06/22 0632  NA 135   < > 134* 134* 136 136 137  K 5.2*   < > 4.5 4.7 4.3 4.0 3.5  CL 101   < > 107 107 106 105 105  CO2 27   < > 23 22 24 24 25   GLUCOSE 98   < > 78 77 96 71 97  BUN 18   < > 18 19 20 22  24*  CREATININE 1.62*   < > 1.47* 1.50* 1.51* 1.28* 1.30*  CALCIUM 9.1   < > 8.7* 8.8* 9.2 8.7* 8.5*  MG  --   --   --   --  1.9 1.7 1.9  PHOS 4.1  --   --  2.9  --   --   --    < > = values in this interval not displayed.    Liver Function Tests: Recent Labs  Lab 10/31/22 0611 11/03/22 0437 11/05/22 0821  AST 24  --  47*  ALT 19  --  22  ALKPHOS 71  --  51  BILITOT 0.8  --  0.9  PROT 6.5  --  5.1*   ALBUMIN 3.6 2.6* 2.7*    CBG: Recent Labs  Lab 11/01/22 0834 11/01/22 1628  GLUCAP 93 89    Anemia Panel: Recent Labs    11/04/22 0517  VITAMINB12 759  FOLATE 13.1  FERRITIN 368*  TIBC 211*  IRON 15*  RETICCTPCT 1.2    Sepsis Labs: Recent Labs  Lab 11/05/22 0830  PROCALCITON 0.13    Radiology Studies: [facility] Chest [facility] 1 [address]  Result Date: 11/04/2022 CLINICAL DATA:  Cough EXAM: PORTABLE CHEST 1 VIEW COMPARISON:  X-ray 10/30/2022 FINDINGS: Underinflation compared to the prior x-ray. Stable cardiopericardial silhouette with calcified aorta. Left upper chest defibrillator with leads overlying the right side of the heart. Vascular congestion. No pneumothorax or effusion. Question trace edema. No separate consolidation IMPRESSION: Vascular congestion with question trace edema. Defibrillator. Decreased inflation compared to prior x-ray.  Recommend follow-up Electronically Signed   By: Karen Kays M.D.   On: 11/04/2022 10:38     Scheduled Meds:  (feeding supplement) PROSource Plus  30 mL Oral TID BM   acetaminophen  1,000 mg Oral Q8H   apixaban  2.5 mg Oral BID   aspirin  81 mg Oral Q1200   atorvastatin  80 mg Oral Daily   calcium carbonate  750 mg Oral Daily   dextromethorphan-guaiFENesin  1 tablet Oral BID   docusate sodium  100 mg Oral BID   feeding supplement  237 mL Oral BID BM   gabapentin  100 mg Oral QHS   ipratropium-albuterol  3 mL Nebulization BID   melatonin  10 mg Oral QHS   metoprolol tartrate  12.5 mg Oral BID   midodrine  15 mg Oral TID WC   mometasone-formoterol  2 puff Inhalation BID   pantoprazole  40 mg Oral Daily   polyethylene glycol  17 g Oral BID   pyridostigmine  60 mg Oral Q8H   sertraline  25 mg Oral Daily   sodium chloride flush  3 mL Intravenous Q12H   sucralfate  1 g Oral TID WC & HS   tamsulosin  0.4 mg Oral Daily   Continuous Infusions:  sodium chloride     amiodarone 30 mg/hr (11/06/22 0141)     LOS: 7 days    Time spent: 55  minutes including time at bedside and coordinating care    Leeroy Bock, DO [facility] Pager [phone]

## 2022-11-06 NOTE — Progress Notes (Signed)
PT Cancellation Note  Patient Details Name: Matthew Zamora MRN: [id number] DOB: Sep 09, 1940   Cancelled Treatment:    Reason Eval/Treat Not Completed: Medical issues which prohibited therapy (Pt transfered to higher level of care s/p VF and AICD firing. Cardiology following. Will sign off at this time and await new orders once pt is medically stable.)  9:03 AM, 11/06/22 Rosamaria Lints, PT, DPT Physical Therapist - [facility]  [phone] (ASCOM)    Marita Burnsed C 11/06/2022, 9:03 AM

## 2022-11-06 NOTE — Progress Notes (Signed)
[facility] CLINIC CARDIOLOGY CONSULT NOTE       Patient ID: Matthew Zamora MRN: [id number] DOB/AGE: May 13, 1941 82 y.o.  Admit date: 10/30/2022 Referring Physician Dr. Esaw Grandchild Primary Physician Dr. Jerl Mina Primary Cardiologist Derwood Kaplan, NP -[facility] health cardiology Reason for Consultation Atrial fibrillation with RVR  HPI: ZAILEN STATZER is a 82 y.o. male who presented to the ED on 10/30/2022 for hip pain. 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  Upon workup in the ED patient was found to have hip fracture and subsequently was taken from surgical repair on 10/31/2022.  Postoperatively he was noted to be in atrial fibrillation with rates in the 100-110s, home metoprolol was restarted.  He was noted to have a cough yesterday, chest x-ray demonstrated edema and he was given IV Lasix 20 mg x 1.  Today heart rate was elevated to the 130-140s. Cardiology was consulted for further evaluation and management of A-fib with RVR. After initial consult patient had an episode of VT/VF and was shocked by his ICD.   Interval History:  -Patient reports that he is feeling well this morning.  Denies any recurrent episodes of ICD shocks. -Denies chest pain, shortness of breath, dizziness, palpitations. -Noted hemoglobin of 7.2 today down from 8.2 yesterday as well as white blood cell count of 2.7.  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 pylori infection    History of hepatitis    Hyperlipidemia    Hypertension    Liver cyst 05/16/15   PAF (paroxysmal atrial fibrillation) ([name])    Prostate CA ([name])     Past Surgical History:  Procedure Laterality Date   CATARACT EXTRACTION     COLONOSCOPY  10/06/2014, 09/18/2004, 03/14/2009   ESOPHAGOGASTRODUODENOSCOPY  10/06/2014, 03/18/2001, 03/14/2009   ESOPHAGOGASTRODUODENOSCOPY (EGD) WITH PROPOFOL N/A 05/07/2018   Procedure: ESOPHAGOGASTRODUODENOSCOPY (EGD) WITH PROPOFOL;  Surgeon: Toledo, Boykin Nearing, MD;  Location: [facility] ENDOSCOPY;  Service: Gastroenterology;  Laterality: N/A;   ESOPHAGOGASTRODUODENOSCOPY (EGD) WITH PROPOFOL N/A 04/22/2022   Procedure: ESOPHAGOGASTRODUODENOSCOPY (EGD) WITH PROPOFOL;  Surgeon: Toney Reil, MD;  Location: [facility] ENDOSCOPY;  Service: Endoscopy;  Laterality: N/A;   FLEXIBLE SIGMOIDOSCOPY  08/26/1990   HIP ARTHROPLASTY Right 10/31/2022   Procedure: ARTHROPLASTY BIPOLAR HIP (HEMIARTHROPLASTY)-RNFA;  Surgeon: Reinaldo Berber, MD;  Location: [facility] ORS;  Service: Orthopedics;  Laterality: Right;   INSERTION OF ICD     PROSTATE SURGERY     TONSILLECTOMY      Medications Prior to Admission  Medication Sig Dispense Refill Last Dose   acetaminophen (TYLENOL) 325 MG tablet Take 2 tablets (650 mg total) by mouth every 4 (four) hours as needed for headache or mild pain.   10/30/2022   albuterol (PROVENTIL) (2.5 MG/3ML) 0.083% nebulizer solution Inhale 3 mLs (2.5 mg total) into the lungs as needed for  shortness of breath. 75 mL 12 10/30/2022   aspirin 81 MG chewable tablet Chew 81 mg by mouth daily at 12 noon.   10/30/2022   atorvastatin (LIPITOR) 80 MG tablet Take 80 mg by mouth daily.   10/30/2022   calcium carbonate (TUMS EX) 750 MG chewable tablet Chew 1 tablet by mouth daily.   10/29/2022   ELIQUIS 2.5 MG TABS tablet Take 2.5 mg by mouth 2  (two) times daily.   10/30/2022   fluticasone-salmeterol (ADVAIR) 500-50 MCG/ACT AEPB Inhale 1 puff into the lungs in the morning and at bedtime.   10/30/2022   gabapentin (NEURONTIN) 100 MG capsule Take 100 mg by mouth at bedtime.   10/30/2022   magnesium oxide (MAG-OX) 400 (240 Mg) MG tablet Take 1 tablet by mouth daily.   10/29/2022   melatonin 3 MG TABS tablet Take 9 mg by mouth at bedtime.   10/29/2022   midodrine (PROAMATINE) 5 MG tablet Take 3 tablets (15 mg total) by mouth 3 (three) times daily with meals.   10/30/2022   ondansetron (ZOFRAN-ODT) 4 MG disintegrating tablet Take 4 mg by mouth every 8 (eight) hours as needed for refractory nausea / vomiting, vomiting or nausea.   Past Week   pantoprazole (PROTONIX) 40 MG tablet Take 1 tablet (40 mg total) by mouth 2 (two) times daily.   10/30/2022   pyridostigmine (MESTINON) 60 MG tablet Take 1 tablet (60 mg total) by mouth every 8 (eight) hours.   10/30/2022   sertraline (ZOLOFT) 25 MG tablet Take 25 mg by mouth daily.   10/30/2022   sucralfate (CARAFATE) 1 GM/10ML suspension Take 10 mLs (1 g total) by mouth 4 (four) times daily -  with meals and at bedtime. 420 mL 0 10/29/2022   tamsulosin (FLOMAX) 0.4 MG CAPS capsule Take 0.4 mg by mouth daily.   10/29/2022   albuterol (VENTOLIN HFA) 108 (90 Base) MCG/ACT inhaler Inhale 2 puffs into the lungs every 4 (four) hours as needed.      ascorbic acid (VITAMIN C) 500 MG tablet Take 1 tablet by mouth daily.   Unknown   metoCLOPramide (REGLAN) 5 MG tablet Take 1 tablet (5 mg total) by mouth every 8 (eight) hours as needed for refractory nausea / vomiting.   More than a month   Multiple Vitamin (MULTIVITAMIN WITH MINERALS) TABS tablet Take 1 tablet by mouth daily.   Unknown   Nebulizer MISC 1 each by Does not apply route as needed. 1 each 0 Unknown   Nutritional Supplements (,FEEDING SUPPLEMENT, PROSOURCE PLUS) liquid Take 30 mLs by mouth 3 (three) times daily between meals.   Unknown   tadalafil (CIALIS) 20 MG  tablet Take 20 mg by mouth daily.   Unknown   Social History   Socioeconomic History   Marital status: Single    Spouse name: Not on file   Number of children: Not on file   Years of education: Not on file   Highest education level: Not on file  Occupational History   Not on file  Tobacco Use   Smoking status: Former    Packs/day: 1.00    Years: 38.00    Additional pack years: 0.00    Total pack years: 38.00    Types: Cigarettes    Quit date: 06/18/1999    Years since quitting: 23.4   Smokeless tobacco: Never  Vaping Use   Vaping Use: Never used  Substance and Sexual Activity   Alcohol use: No    Alcohol/week: 0.0  standard drinks of alcohol   Drug use: Not Currently   Sexual activity: Not Currently  Other Topics Concern   Not on file  Social History Narrative   Not on file   Social Determinants of Health   Financial Resource Strain: Not on file  Food Insecurity: No Food Insecurity (10/30/2022)   Hunger Vital Sign    Worried About Running Out of Food in the Last Year: Never true    Ran Out of Food in the Last Year: Never true  Transportation Needs: No Transportation Needs (10/30/2022)   PRAPARE - Administrator, Civil Service (Medical): No    Lack of Transportation (Non-Medical): No  Physical Activity: Not on file  Stress: Not on file  Social Connections: Not on file  Intimate Partner Violence: Not At Risk (10/30/2022)   Humiliation, Afraid, Rape, and Kick questionnaire    Fear of Current or Ex-Partner: No    Emotionally Abused: No    Physically Abused: No    Sexually Abused: No    Family History  Problem Relation Age of Onset   Heart attack Mother    Heart attack Father      Vitals:   11/06/22 0410 11/06/22 0503 11/06/22 0854 11/06/22 0911  BP: 125/67   (!) 137/59  Pulse: 69  68 78  Resp: 20  20 18   Temp: 98.3 F (36.8 C)   97.7 F (36.5 C)  TempSrc: Oral   Oral  SpO2: 98%  97% 98%  Weight:  78.5 kg    Height:        PHYSICAL  EXAM General: Ill-appearing elderly male sitting upright in bed eating breakfast, no acute distress.  No family present at bedside HEENT: Normocephalic and atraumatic. Neck: No JVD.  Lungs: Normal respiratory effort now on RA which is proved from his baseline of 2 L. Coarse breath sounds bilaterally in upper lungs. Heart: HRRR. Normal S1 and S2 without gallops or murmurs.  Abdomen: Non-distended appearing.  Msk: Normal strength and tone for age. Extremities: Warm and well perfused. No clubbing, cyanosis.  No edema.  Neuro: Alert and oriented X 3. Psych: Answers questions appropriately.   Labs: Basic Metabolic Panel: Recent Labs    11/05/22 0821 11/06/22 0632  NA 136 137  K 4.0 3.5  CL 105 105  CO2 24 25  GLUCOSE 71 97  BUN 22 24*  CREATININE 1.28* 1.30*  CALCIUM 8.7* 8.5*  MG 1.7 1.9   Liver Function Tests: Recent Labs    11/05/22 0821  AST 47*  ALT 22  ALKPHOS 51  BILITOT 0.9  PROT 5.1*  ALBUMIN 2.7*   No results for input(s): "LIPASE", "AMYLASE" in the last 72 hours. CBC: Recent Labs    11/05/22 0830 11/06/22 0632  WBC 4.2 2.7*  HGB 8.2* 7.2*  HCT 25.9* 22.6*  MCV 92.2 90.4  PLT 109* 126*   Cardiac Enzymes: No results for input(s): "CKTOTAL", "CKMB", "CKMBINDEX", "TROPONINIHS" in the last 72 hours. BNP: Recent Labs    11/05/22 0830  BNP 806.8*   D-Dimer: No results for input(s): "DDIMER" in the last 72 hours. Hemoglobin A1C: No results for input(s): "HGBA1C" in the last 72 hours. Fasting Lipid Panel: No results for input(s): "CHOL", "HDL", "LDLCALC", "TRIG", "CHOLHDL", "LDLDIRECT" in the last 72 hours. Thyroid Function Tests: No results for input(s): "TSH", "T4TOTAL", "T3FREE", "THYROIDAB" in the last 72 hours.  Invalid input(s): "FREET3" Anemia Panel: Recent Labs    11/04/22 0517  VITAMINB12 759  FOLATE 13.1  FERRITIN 368*  TIBC 211*  IRON 15*  RETICCTPCT 1.2     Radiology: [facility] Chest Port 1 View  Result Date: 11/04/2022 CLINICAL  DATA:  Cough EXAM: PORTABLE CHEST 1 VIEW COMPARISON:  X-ray 10/30/2022 FINDINGS: Underinflation compared to the prior x-ray. Stable cardiopericardial silhouette with calcified aorta. Left upper chest defibrillator with leads overlying the right side of the heart. Vascular congestion. No pneumothorax or effusion. Question trace edema. No separate consolidation IMPRESSION: Vascular congestion with question trace edema. Defibrillator. Decreased inflation compared to prior x-ray.  Recommend follow-up Electronically Signed   By: Karen Kays M.D.   On: 11/04/2022 10:38   DG Pelvis Portable  Result Date: 10/31/2022 CLINICAL DATA:  Bipolar right hip hemiarthroplasty. EXAM: PORTABLE PELVIS 1-2 VIEWS COMPARISON:  CT right hip 11/02/2022 FINDINGS: Single frontal view of the bilateral hips. Interval bipolar right hip hemiarthroplasty with normal alignment. Expected postoperative subcutaneous air about the right hip. Mild superomedial left femoroacetabular joint space narrowing. Surgical clips overlie the pelvis. No acute fracture or dislocation. IMPRESSION: Interval bipolar right hip hemiarthroplasty with normal alignment. Electronically Signed   By: Neita Garnet M.D.   On: 10/31/2022 16:23   CT Hip Right Wo Contrast  Result Date: 10/30/2022 CLINICAL DATA:  Right femoral fracture EXAM: CT OF THE RIGHT HIP WITHOUT CONTRAST TECHNIQUE: Multidetector CT imaging of the right hip was performed according to the standard protocol. Multiplanar CT image reconstructions were also generated. RADIATION DOSE REDUCTION: This exam was performed according to the departmental dose-optimization program which includes automated exposure control, adjustment of the mA and/or kV according to patient size and/or use of iterative reconstruction technique. COMPARISON:  Right hips and pelvis radiograph dated 10/30/2022 FINDINGS: Bones/Joint/Cartilage Acute transcervical fracture of the right proximal femur with apex anterior angulation. The  right femoroacetabular joint is intact. Ligaments Suboptimally assessed by CT. Muscles and Tendons Grossly intact. Soft tissues Vascular calcifications of the partially imaged right lower extremity arteries. IMPRESSION: Mildly angulated right femoral transcervical fracture. Electronically Signed   By: Agustin Cree M.D.   On: 10/30/2022 20:49   DG Knee 2 Views Right  Result Date: 10/30/2022 CLINICAL DATA:  Right knee pain, fall EXAM: RIGHT KNEE - 1-2 VIEW COMPARISON:  None Available. FINDINGS: Moderate tricompartment degenerative changes with joint space narrowing and spurring. No joint effusion. No acute bony abnormality. Specifically, no fracture, subluxation, or dislocation. IMPRESSION: Moderate degenerative changes.  No acute bony abnormality. Electronically Signed   By: Charlett Nose M.D.   On: 10/30/2022 20:25   DG Hip Unilat W or Wo Pelvis 2-3 Views Right  Result Date: 10/30/2022 CLINICAL DATA:  Fall, right hip pain EXAM: DG HIP (WITH OR WITHOUT PELVIS) 2-3V RIGHT COMPARISON:  None Available. FINDINGS: There is foreshortening of the right femoral neck with cortical irregularity noted laterally concerning for right femoral neck fracture. No subluxation or dislocation. Mild symmetric degenerative changes in the hips with joint space narrowing and spurring. IMPRESSION: Foreshortening of the right femoral neck with cortical irregularity laterally concerning for femoral neck fracture. Electronically Signed   By: Charlett Nose M.D.   On: 10/30/2022 20:24   DG Chest Portable 1 View  Result Date: 10/30/2022 CLINICAL DATA:  Fall, right hip pain EXAM: PORTABLE CHEST 1 VIEW COMPARISON:  09/14/2022 FINDINGS: Left AICD remains in place, unchanged. Heart is normal size. Mediastinal contours within normal limits. No confluent opacities or effusions. No acute bony abnormality. IMPRESSION: No active disease. Electronically Signed   By: Charlett Nose M.D.   On: 10/30/2022 20:22  CT HEAD WO CONTRAST ( )  Result  Date: 10/30/2022 CLINICAL DATA:  Trip and fall injury. EXAM: CT HEAD WITHOUT CONTRAST CT CERVICAL SPINE WITHOUT CONTRAST TECHNIQUE: Multidetector CT imaging of the head and cervical spine was performed following the standard protocol without intravenous contrast. Multiplanar CT image reconstructions of the cervical spine were also generated. RADIATION DOSE REDUCTION: This exam was performed according to the departmental dose-optimization program which includes automated exposure control, adjustment of the mA and/or kV according to patient size and/or use of iterative reconstruction technique. COMPARISON:  Head CT without contrast 04/17/2022, cervical spine CT 03/27/2022. FINDINGS: CT HEAD FINDINGS Brain: There is mild atrophy, small-vessel disease and atrophic ventriculomegaly. No new asymmetry is seen concerning for an acute infarct, hemorrhage or mass. There is no midline shift. The basal cisterns are clear. Vascular: There are patchy calcifications of the carotid siphons but no hyperdense central vessels. Skull: No fracture or focal lesions.  Mild calvarial osteopenia. Sinuses/Orbits: No acute findings. Clear visualized sinuses and mastoid air cells. Negative orbits apart from old lens replacements. Other: None. CT CERVICAL SPINE FINDINGS Alignment: Unchanged. There is a trace 2 mm C3-4 retrolisthesis, similar minimal C4-5 anterolisthesis and trace C5-6 and C6-7 retrolisthesis all believed degenerative and all unchanged. No traumatic listhesis is seen. There is bone-on-bone anterior atlantodental joint space loss with osteophytes, and degenerative cystic changes in the odontoid process. Skull base and vertebrae: No acute fracture is evident. Bone mineralization is osteopenic. No primary bone lesion or focal pathologic process is seen. Soft tissues and spinal canal: No prevertebral fluid or swelling. No visible canal hematoma. There are calcifications in both proximal cervical ICAs, heaviest on the left where  there is probably a flow-limiting origin stenosis. No laryngeal or thyroid mass. Disc levels: There is moderate disc space loss again at C3-4, C5-6 and C6-7, mild disc narrowing at C3-4, normal disc heights at C2-3 and C7-T1. There are small bidirectional endplate spurs from C3-4 through C6-7, causing partial effacement of the ventral CSF without spondylotic cord compression. Facet joint spurring on the left-greater-than-right is seen at most levels with bilateral uncinate spurring. Acquired foraminal stenosis is mild on the right at C2-3, severe on the left and moderate on the right at C3-4, bilaterally mild-to-moderate C6-7, and not seen at the remaining levels. Upper chest: Pacemaker wiring is partially visible entering from the left. Otherwise negative. Other: None. IMPRESSION: 1. No acute intracranial CT findings or depressed skull fractures. 2. Osteopenia and degenerative change without evidence of cervical fractures or traumatic listhesis. 3. 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 Follow-up as indicated. Electronically Signed   By: Almira Bar M.D.   On: 10/30/2022 20:15   CT Cervical Spine Wo Contrast  Result Date: 10/30/2022 CLINICAL DATA:  Trip and fall injury. EXAM: CT HEAD WITHOUT CONTRAST CT CERVICAL SPINE WITHOUT CONTRAST TECHNIQUE: Multidetector CT imaging of the head and cervical spine was performed following the standard protocol without intravenous contrast. Multiplanar CT image reconstructions of the cervical spine were also generated. RADIATION DOSE REDUCTION: This exam was performed according to the departmental dose-optimization program which includes automated exposure control, adjustment of the mA and/or kV according to patient size and/or use of iterative reconstruction technique. COMPARISON:  Head CT without contrast 04/17/2022, cervical spine CT 03/27/2022. FINDINGS: CT HEAD FINDINGS Brain: There is mild atrophy, small-vessel  disease and atrophic ventriculomegaly. No new asymmetry is seen concerning for an acute infarct, hemorrhage or mass. There is no midline shift. The basal cisterns  are clear. Vascular: There are patchy calcifications of the carotid siphons but no hyperdense central vessels. Skull: No fracture or focal lesions.  Mild calvarial osteopenia. Sinuses/Orbits: No acute findings. Clear visualized sinuses and mastoid air cells. Negative orbits apart from old lens replacements. Other: None. CT CERVICAL SPINE FINDINGS Alignment: Unchanged. There is a trace 2 mm C3-4 retrolisthesis, similar minimal C4-5 anterolisthesis and trace C5-6 and C6-7 retrolisthesis all believed degenerative and all unchanged. No traumatic listhesis is seen. There is bone-on-bone anterior atlantodental joint space loss with osteophytes, and degenerative cystic changes in the odontoid process. Skull base and vertebrae: No acute fracture is evident. Bone mineralization is osteopenic. No primary bone lesion or focal pathologic process is seen. Soft tissues and spinal canal: No prevertebral fluid or swelling. No visible canal hematoma. There are calcifications in both proximal cervical ICAs, heaviest on the left where there is probably a flow-limiting origin stenosis. No laryngeal or thyroid mass. Disc levels: There is moderate disc space loss again at C3-4, C5-6 and C6-7, mild disc narrowing at C3-4, normal disc heights at C2-3 and C7-T1. There are small bidirectional endplate spurs from C3-4 through C6-7, causing partial effacement of the ventral CSF without spondylotic cord compression. Facet joint spurring on the left-greater-than-right is seen at most levels with bilateral uncinate spurring. Acquired foraminal stenosis is mild on the right at C2-3, severe on the left and moderate on the right at C3-4, bilaterally mild-to-moderate C6-7, and not seen at the remaining levels. Upper chest: Pacemaker wiring is partially visible entering from the left.  Otherwise negative. Other: None. IMPRESSION: 1. No acute intracranial CT findings or depressed skull fractures. 2. Osteopenia and degenerative change without evidence of cervical fractures or traumatic listhesis. 3. 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 Follow-up as indicated. Electronically Signed   By: Almira Bar M.D.   On: 10/30/2022 20:15   CT Angio Chest PE W/Cm &/Or Wo Cm  Result Date: 10/15/2022 CLINICAL DATA:  Chest pain. EXAM: CT ANGIOGRAPHY CHEST WITH CONTRAST TECHNIQUE: Multidetector CT imaging of the chest was performed using the standard protocol during bolus administration of intravenous contrast. Multiplanar CT image reconstructions and MIPs were obtained to evaluate the vascular anatomy. RADIATION DOSE REDUCTION: This exam was performed according to the departmental dose-optimization program which includes automated exposure control, adjustment of the mA and/or kV according to patient size and/or use of iterative reconstruction technique. CONTRAST:  60mL OMNIPAQUE IOHEXOL 350 MG/ML SOLN COMPARISON:  May 11, 2022. FINDINGS: Cardiovascular: Satisfactory opacification of the pulmonary arteries to the segmental level. No evidence of pulmonary embolism. Normal heart size. No pericardial effusion. Coronary artery calcifications are noted. Mediastinum/Nodes: No enlarged mediastinal, hilar, or axillary lymph nodes. Thyroid gland, trachea, and esophagus demonstrate no significant findings. Lungs/Pleura: No pneumothorax or pleural effusion is noted. Stable 6 mm nodule is noted in left lower lobe best seen on image number 83 of series 5. 1 cm part solid nodule is noted in right upper lobe best seen on image number 58 of series 5, with 4 mm solid nodule. Minimal right posterior basilar subsegmental atelectasis or scarring is noted. Stable 5 mm nodule is noted in right lower lobe best seen on image number 90 of series 5. Upper Abdomen: No acute  abnormality. Musculoskeletal: No chest wall abnormality. No acute or significant osseous findings. Review of the MIP images confirms the above findings. IMPRESSION: No definite evidence of pulmonary embolus. Interval development of 1 cm part solid nodule in right upper lobe.  Follow-up non-contrast CT recommended at 3-6 months to confirm persistence. If unchanged, and solid component remains <6 mm, annual CT is recommended until 5 years of stability has been established. If persistent these nodules should be considered highly suspicious if the solid component of the nodule is 6 mm or greater in size and enlarging. This recommendation follows the consensus statement: Guidelines for Management of Incidental Pulmonary Nodules Detected on CT Images: From the Fleischner Society 2017; Radiology 2017; 284:228-243. Stable bilateral lower lobe nodules are noted, the largest measuring 6 mm. Attention to these on follow-up imaging is recommended. Coronary artery calcifications are noted. Aortic Atherosclerosis (ICD10-I70.0) and Emphysema (ICD10-J43.9). Electronically Signed   By: Lupita Raider M.D.   On: 10/15/2022 09:29    ECHO 04/21/2022: 1. Left ventricular ejection fraction, by estimation, is 30 to 35%. The left ventricle has moderately decreased function. The left ventricle demonstrates global hypokinesis. The left ventricular internal cavity size was mildly to moderately dilated.  Left ventricular diastolic parameters are consistent with Grade III diastolic dysfunction (restrictive).   2. Right ventricular systolic function is low normal. The right  ventricular size is mildly enlarged.   3. Left atrial size was moderately dilated.   4. Right atrial size was mild to moderately dilated.   5. The mitral valve is normal in structure. Trivial mitral valve  regurgitation.   6. The aortic valve is normal in structure. Aortic valve regurgitation is not visualized. Aortic valve sclerosis/calcification is present,  without any evidence of aortic stenosis.   TELEMETRY reviewed by me [facility]) 11/06/2022 : SR 63 with occasional PACs and PVCs. Intermittent periods of possible AF appear to be short. No recurrence of VT/VF.   EKG reviewed by me: EKG following his ICD shock on 5/21 has not loaded into Epic and paper copy was not available on the unit for me to review. EKG from 5/19 reviewed and shows atrial fibrillation with PVCs with rapid rate 138 bpm.  Data reviewed by me [facility]) 11/06/2022: ortho progress notes, hospitalist progress notes, admission H&P, last 24h vitals tele labs imaging I/O   Principal Problem:   Femoral fracture ([name]) Active Problems:   Paroxysmal atrial fibrillation ([name])   Acute on chronic combined systolic and diastolic CHF (congestive heart failure) ([name])   Hypotension   CKD (chronic kidney disease), stage IIIb   GERD without esophagitis   COPD (chronic obstructive pulmonary disease) ([name])   Normocytic anemia   Osteoporosis   Closed displaced fracture of right femoral neck ([name])    ASSESSMENT AND PLAN:  Matthew Zamora is a 83 y.o. male who presented to the ED on 10/30/2022 for hip pain. 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  Upon workup in the ED patient was found to have hip fracture and subsequently was taken from surgical repair on 10/31/2022.  Postoperatively he was noted to be in atrial fibrillation with rates in the 100-110s, home metoprolol was restarted.  He was noted to have a cough yesterday, chest x-ray demonstrated edema and he was given IV Lasix 20 mg x 1. Today he was noted to have heart rates elevated to the 130-140s. Cardiology was consulted for further evaluation and management of A-fib with RVR. After initial consult patient was noted to have  episode of VT with subsequent ICD shock.   #  Ventricular Tachycardia/Fibrillation s/p ICD shock 11/05/2022 # Ischemic Cardiomyopathy s/p [employer] ICD 2009 # CAD s/p PCI and DES to RCA in 2019 -Patient hemodynamically stable and A&O x3 after ICD shock on 11/05/2022.  -Start amiodarone IV load > infusion. Plan for total load of 5-6 grams. Continue telemetry monitoring for recurrent VT/VF.  -Will recheck LFTs tomorrow -ICD interrogation reports a 22-second episode of ventricular fibrillation today with a ventricular rate of 207 bpm shocked with 25 J x 2 as well as an additional episode of V-fib lasting for 4 seconds that was not shocked. Interrogation document placed in patient's chart on the unit. -Will replete potassium and magnesium today. Monitor and replenish electrolytes for a goal K >4, Mag >2  -Thyroid function normal 5/16, LFTs normal 5/16 -BNP today 806. Most recent echo from 04/2022 with EF 30-35%. S/p IV Lasix 20 mg x1 yesterday. -Continue Lipitor 80 mg daily and Aspirin 81 mg daily for secondary prevention.   # Atrial Fibrillation RVR -In sinus rhythm this morning on telemetry with rate controlled in the 60s -Continue Eliquis 2.5 mg twice daily (reduced dose appropriate given age and Cr 1.51) -Continue Metoprolol 12.5 mg twice daily.   # Chronic Kidney Disease Stage IIIb -Last Cr 1. 3, which is improved.  Continue to monitor closely.    This patient's plan of care was discussed and created with Dr. Juliann Pares and he is in agreement.  Signed: Cheryl Flash, PA-C 11/06/2022, 11:13 AM [facility]

## 2022-11-06 NOTE — Progress Notes (Signed)
EPIC Chatted PM Provider with concerns over increased congested, productive cough and increased shortness of breath

## 2022-11-06 NOTE — Progress Notes (Signed)
OT Cancellation Note  Patient Details Name: TAURUS MAJKUT MRN: [id number] DOB: 1940/06/22   Cancelled Treatment:    Reason Eval/Treat Not Completed: Medical issues which prohibited therapy (Pt with change in status, t/f to 2A for VF after AICD activation. D/c therapy; will need new orders for therapy to re-engage once pt medically stable.)  Alvester Morin 11/06/2022, 9:07 AM

## 2022-11-06 NOTE — Plan of Care (Signed)
Patient is participating with goals of care to meet goals for discharge.  Matthew Saver, RN    Problem: Education: Goal: Knowledge of General Education information will improve Description: Including pain rating scale, medication(s)/side effects and non-pharmacologic comfort measures Outcome: Progressing   Problem: Health Behavior/Discharge Planning: Goal: Ability to manage health-related needs will improve Outcome: Progressing   Problem: Clinical Measurements: Goal: Ability to maintain clinical measurements within normal limits will improve Outcome: Progressing Goal: Will remain free from infection Outcome: Progressing Goal: Diagnostic test results will improve Outcome: Progressing Goal: Respiratory complications will improve Outcome: Progressing Goal: Cardiovascular complication will be avoided Outcome: Progressing   Problem: Activity: Goal: Risk for activity intolerance will decrease Outcome: Progressing   Problem: Nutrition: Goal: Adequate nutrition will be maintained Outcome: Progressing   Problem: Coping: Goal: Level of anxiety will decrease Outcome: Progressing   Problem: Elimination: Goal: Will not experience complications related to bowel motility Outcome: Progressing Goal: Will not experience complications related to urinary retention Outcome: Progressing   Problem: Pain Managment: Goal: General experience of comfort will improve Outcome: Progressing   Problem: Safety: Goal: Ability to remain free from injury will improve Outcome: Progressing   Problem: Skin Integrity: Goal: Risk for impaired skin integrity will decrease Outcome: Progressing   Problem: Education: Goal: Verbalization of understanding the information provided (i.e., activity precautions, restrictions, etc) will improve Outcome: Progressing Goal: Individualized Educational Video(s) Outcome: Progressing   Problem: Activity: Goal: Ability to ambulate and perform ADLs will  improve Outcome: Progressing   Problem: Clinical Measurements: Goal: Postoperative complications will be avoided or minimized Outcome: Progressing   Problem: Self-Concept: Goal: Ability to maintain and perform role responsibilities to the fullest extent possible will improve Outcome: Progressing   Problem: Pain Management: Goal: Pain level will decrease Outcome: Progressing

## 2022-11-06 NOTE — Plan of Care (Signed)
Patient is participating in goals of care to meet goals for discharge.  Terrilyn Saver, RN    Problem: Education: Goal: Knowledge of General Education information will improve Description: Including pain rating scale, medication(s)/side effects and non-pharmacologic comfort measures Outcome: Progressing   Problem: Health Behavior/Discharge Planning: Goal: Ability to manage health-related needs will improve Outcome: Progressing   Problem: Clinical Measurements: Goal: Ability to maintain clinical measurements within normal limits will improve Outcome: Progressing Goal: Will remain free from infection Outcome: Progressing Goal: Diagnostic test results will improve Outcome: Progressing Goal: Respiratory complications will improve Outcome: Progressing Goal: Cardiovascular complication will be avoided Outcome: Progressing   Problem: Activity: Goal: Risk for activity intolerance will decrease Outcome: Progressing   Problem: Nutrition: Goal: Adequate nutrition will be maintained Outcome: Progressing   Problem: Coping: Goal: Level of anxiety will decrease Outcome: Progressing   Problem: Elimination: Goal: Will not experience complications related to bowel motility Outcome: Progressing Goal: Will not experience complications related to urinary retention Outcome: Progressing   Problem: Pain Managment: Goal: General experience of comfort will improve Outcome: Progressing   Problem: Safety: Goal: Ability to remain free from injury will improve Outcome: Progressing   Problem: Skin Integrity: Goal: Risk for impaired skin integrity will decrease Outcome: Progressing   Problem: Education: Goal: Verbalization of understanding the information provided (i.e., activity precautions, restrictions, etc) will improve Outcome: Progressing Goal: Individualized Educational Video(s) Outcome: Progressing   Problem: Activity: Goal: Ability to ambulate and perform ADLs will  improve Outcome: Progressing   Problem: Clinical Measurements: Goal: Postoperative complications will be avoided or minimized Outcome: Progressing   Problem: Self-Concept: Goal: Ability to maintain and perform role responsibilities to the fullest extent possible will improve Outcome: Progressing   Problem: Pain Management: Goal: Pain level will decrease Outcome: Progressing

## 2022-11-07 LAB — TYPE AND SCREEN: Antibody Screen: NEGATIVE

## 2022-11-07 LAB — CBC WITH DIFFERENTIAL/PLATELET
Abs Immature Granulocytes: 0.04 10*3/uL (ref 0.00–0.07)
Basophils Absolute: 0 10*3/uL (ref 0.0–0.1)
Basophils Relative: 0 %
Eosinophils Absolute: 0 10*3/uL (ref 0.0–0.5)
Eosinophils Relative: 0 %
HCT: 26.8 % — ABNORMAL LOW (ref 39.0–52.0)
Hemoglobin: 8.8 g/dL — ABNORMAL LOW (ref 13.0–17.0)
Immature Granulocytes: 1 %
Lymphocytes Relative: 4 %
Lymphs Abs: 0.3 10*3/uL — ABNORMAL LOW (ref 0.7–4.0)
MCH: 29.4 pg (ref 26.0–34.0)
MCHC: 32.8 g/dL (ref 30.0–36.0)
MCV: 89.6 fL (ref 80.0–100.0)
Monocytes Absolute: 0.6 10*3/uL (ref 0.1–1.0)
Monocytes Relative: 8 %
Neutro Abs: 6.6 10*3/uL (ref 1.7–7.7)
Neutrophils Relative %: 87 %
Platelets: 157 10*3/uL (ref 150–400)
RBC: 2.99 MIL/uL — ABNORMAL LOW (ref 4.22–5.81)
RDW: 14.6 % (ref 11.5–15.5)
WBC: 7.5 10*3/uL (ref 4.0–10.5)
nRBC: 0 % (ref 0.0–0.2)

## 2022-11-07 LAB — COMPREHENSIVE METABOLIC PANEL
ALT: 28 U/L (ref 0–44)
AST: 61 U/L — ABNORMAL HIGH (ref 15–41)
Albumin: 2.6 g/dL — ABNORMAL LOW (ref 3.5–5.0)
Alkaline Phosphatase: 53 U/L (ref 38–126)
Anion gap: 8 (ref 5–15)
BUN: 26 mg/dL — ABNORMAL HIGH (ref 8–23)
CO2: 24 mmol/L (ref 22–32)
Calcium: 8.5 mg/dL — ABNORMAL LOW (ref 8.9–10.3)
Chloride: 100 mmol/L (ref 98–111)
Creatinine, Ser: 1.27 mg/dL — ABNORMAL HIGH (ref 0.61–1.24)
GFR, Estimated: 57 mL/min — ABNORMAL LOW (ref >60)
Glucose, Bld: 111 mg/dL — ABNORMAL HIGH (ref 70–99)
Potassium: 4 mmol/L (ref 3.5–5.1)
Sodium: 132 mmol/L — ABNORMAL LOW (ref 135–145)
Total Bilirubin: 0.9 mg/dL (ref 0.3–1.2)
Total Protein: 5.3 g/dL — ABNORMAL LOW (ref 6.5–8.1)

## 2022-11-07 LAB — BPAM RBC
ISSUE DATE / TIME: 202405221126
Unit Type and Rh: 600

## 2022-11-07 LAB — MAGNESIUM: Magnesium: 1.9 mg/dL (ref 1.7–2.4)

## 2022-11-07 MED ORDER — AMIODARONE HCL 400 MG PO TABS: 400.0000 mg | ORAL_TABLET | Freq: Every day | ORAL | 0 refills | Status: DC

## 2022-11-07 MED ORDER — FUROSEMIDE 10 MG/ML IJ SOLN
40.0000 mg | Freq: Once | INTRAMUSCULAR | Status: AC
  Administered 2022-11-07: 40 mg via INTRAVENOUS
  Filled 2022-11-07: qty 4

## 2022-11-07 MED ORDER — POLYETHYLENE GLYCOL 3350 17 G PO PACK: 17.0000 g | PACK | Freq: Two times a day (BID) | ORAL | 0 refills | Status: DC

## 2022-11-07 MED ORDER — AMIODARONE HCL 200 MG PO TABS: 400.0000 mg | ORAL_TABLET | Freq: Every day | ORAL | Status: DC

## 2022-11-07 MED ORDER — AMIODARONE HCL 200 MG PO TABS
400.0000 mg | ORAL_TABLET | Freq: Two times a day (BID) | ORAL | Status: DC
  Administered 2022-11-07: 400 mg via ORAL
  Filled 2022-11-07: qty 2

## 2022-11-07 MED ORDER — AMIODARONE HCL 400 MG PO TABS: 400.0000 mg | ORAL_TABLET | Freq: Two times a day (BID) | ORAL | 0 refills | Status: DC

## 2022-11-07 MED ORDER — MIDODRINE HCL 5 MG PO TABS: 15.0000 mg | ORAL_TABLET | Freq: Three times a day (TID) | ORAL | Status: DC

## 2022-11-07 MED ORDER — METOPROLOL TARTRATE 25 MG PO TABS: 12.5000 mg | ORAL_TABLET | Freq: Two times a day (BID) | ORAL | Status: DC

## 2022-11-07 NOTE — Evaluation (Signed)
Physical Therapy Evaluation Patient Details Name: Matthew Zamora MRN: [id number] DOB: Jul 15, 1940 Today's Date: 11/07/2022  History of Present Illness  Patient is 82 year old gentleman with history of COPD on chronic oxygen therapy 2 L, hypertension, hyperlipidemia, paroxysmal A-fib and therapeutic on Eliquis, history of prostate cancer, coronary artery disease fell on the floor while trying to turn right and found to have right hip fracture. S/p R hip hemiarthoplasty 10/31/22. Found to be tachycardic on 11/06/22 and ICD shocked pt.  Clinical Impression  Patient seated in recliner upon arrival to room; alert and oriented to basic information, follows commands and agreeable to participation with session.  Noted with active nose bleed from L nares (pressure to area, mild packing in place); RN informed/aware.  Endorses generalized soreness to R hip (4/10), exaggerated with transitional movements, but improves with rest and repositioning.  Demonstrates fair strength (3-/5) and ROM to R LE, limited by post-op soreness.  Able to complete sit/stand, standing balance and short-distance gait (5' x2) with RW, min/mod assist +2 for safety.  Demonstrates forward flexed posture, very short shuffling steps with limited foot clearance, step height/length; fearful of falling, mod SOB with minimal distance/activity (sats >91% on 2L, HR 80-90s with gait). Would benefit from skilled PT to address above deficits and promote optimal return to PLOF.; recommend post-acute PT follow up as indicated by interdisciplinary care team.         Recommendations for follow up therapy are one component of a multi-disciplinary discharge planning process, led by the attending physician.  Recommendations may be updated based on patient status, additional functional criteria and insurance authorization.  Follow Up Recommendations       Assistance Recommended at Discharge Intermittent Supervision/Assistance  Patient can return home  with the following  Two people to help with walking and/or transfers;Two people to help with bathing/dressing/bathroom;Help with stairs or ramp for entrance    Equipment Recommendations    Recommendations for Other Services       Functional Status Assessment       Precautions / Restrictions Precautions Precautions: Fall;Posterior Hip Restrictions Weight Bearing Restrictions: Yes RLE Weight Bearing: Weight bearing as tolerated      Mobility  Bed Mobility               General bed mobility comments: seated in recliner beginning/end of treatment session    Transfers Overall transfer level: Needs assistance Equipment used: Rolling walker (2 wheels) Transfers: Sit to/from Stand Sit to Stand: Min assist, Mod assist, +2 physical assistance           General transfer comment: requires UE support for lift off, anterior weight translation    Ambulation/Gait Ambulation/Gait assistance: Min assist, Mod assist, +2 safety/equipment Gait Distance (Feet): 5 Feet (x2) Assistive device: Rolling walker (2 wheels)         General Gait Details: forward flexed posture, very short shuffling steps with limited foot clearance, step height/length; fearful of falling, mod SOB with minimal distance/activity (sats >91% on 2L, HR 80-90s with gait)  Stairs            Wheelchair Mobility    Modified Rankin (Stroke Patients Only)       Balance Overall balance assessment: Needs assistance Sitting-balance support: No upper extremity supported, Feet supported Sitting balance-Leahy Scale: Good     Standing balance support: Bilateral upper extremity supported Standing balance-Leahy Scale: Fair  Pertinent Vitals/Pain Pain Assessment Pain Assessment: 0-10 Faces Pain Scale: Hurts little more Pain Location: Rt hip Pain Descriptors / Indicators: Grimacing, Tender, Aching Pain Intervention(s): Limited activity within patient's  tolerance, Monitored during session, Repositioned    Home Living Family/patient expects to be discharged to:: Private residence Living Arrangements: Alone Available Help at Discharge: Family;Available PRN/intermittently Type of Home: Mobile home Home Access: Ramped entrance       Home Layout: One level Home Equipment: Rollator (4 wheels);Rolling Walker (2 wheels);Cane - single point;Wheelchair - manual;Toilet riser Additional Comments: DIL manages medications, provides transportation and check on patient 2x/day    Prior Function Prior Level of Function : Needs assist             Mobility Comments: room to room distances in his mobile home, intermittent RW use; recurrent falls. ADLs Comments: pt reports DIL assists with med management, meals; reports     Hand Dominance        Extremity/Trunk Assessment   Upper Extremity Assessment Upper Extremity Assessment: Generalized weakness    Lower Extremity Assessment Lower Extremity Assessment: Generalized weakness (R hip/knee grossly 3-/5 throughout, limited by post-op pain)       Communication      Cognition Arousal/Alertness: Awake/alert Behavior During Therapy: WFL for tasks assessed/performed Overall Cognitive Status: Within Functional Limits for tasks assessed                                          General Comments      Exercises Other Exercises Other Exercises: Sit/stand x3 with RW, min/mod assist +2 for lift off, standing balance; requires UE support at all times for safety and stabilization Other Exercises: Bilat LE strength grossly 3-/5, R hip/knee movement limited by post-op pain/soreness   Assessment/Plan    PT Assessment    PT Problem List         PT Treatment Interventions      PT Goals (Current goals can be found in the Care Plan section)  Acute Rehab PT Goals Patient Stated Goal: return to walking, improve pain, get stronger PT Goal Formulation: With patient Time For Goal  Achievement: 11/15/22 Potential to Achieve Goals: Good    Frequency 7X/week     Co-evaluation               AM-PAC PT "6 Clicks" Mobility  Outcome Measure Help needed turning from your back to your side while in a flat bed without using bedrails?: A Lot Help needed moving from lying on your back to sitting on the side of a flat bed without using bedrails?: A Lot Help needed moving to and from a bed to a chair (including a wheelchair)?: A Lot Help needed standing up from a chair using your arms (e.g., wheelchair or bedside chair)?: A Lot Help needed to walk in hospital room?: A Lot Help needed climbing 3-5 steps with a railing? : A Lot 6 Click Score: 12    End of Session Equipment Utilized During Treatment: Gait belt Activity Tolerance: Patient tolerated treatment well Patient left: in chair;with call bell/phone within reach;with chair alarm set Nurse Communication: Mobility status PT Visit Diagnosis: Unsteadiness on feet (R26.81);Difficulty in walking, not elsewhere classified (R26.2);Other abnormalities of gait and mobility (R26.89)    Time: 1610-9604 PT Time Calculation (min) (ACUTE ONLY): 20 min   Charges:   PT Evaluation $PT Re-evaluation: 1 Re-eval  Briah Nary H. Manson Passey, PT, DPT, NCS 11/07/22, 10:44 AM [phone]

## 2022-11-07 NOTE — TOC Transition Note (Signed)
Transition of Care [facility]) - CM/SW Discharge Note   Patient Details  Name: Matthew Zamora MRN: [id number] Date of Birth: [birth year]  [facility]) CM/SW Contact:  Truddie Hidden, RN Phone Number: 11/07/2022, 12:13 PM   Clinical Narrative:    Spoke with Ricky in admissions. Per facility patient admission confirmed for today. Patient assigned room # E-8 Nurse will call report to [phone]  Nurse notified.  Message left for patient's son.  Face sheet and medical necessity forms printed to the floor to be added to the EMS pack EMS arranged  Discharge summary and SNF tranfer report sent in HUB.  TOC signing off.    Final next level of care: Skilled Nursing Facility Barriers to Discharge: Barriers Resolved   Patient Goals and CMS Choice      Discharge Placement                Patient chooses bed at: Other - please specify in the comment section below: [facility]) Patient to be transferred to facility by: [facility] Name of family member notified: Son, [name] Patient and family notified of of transfer: 11/07/22  Discharge Plan and Services Additional resources added to the After Visit Summary for                                       Social Determinants of Health (SDOH) Interventions SDOH Screenings   Food Insecurity: No Food Insecurity (10/30/2022)  Housing: Low Risk  (10/30/2022)  Transportation Needs: No Transportation Needs (10/30/2022)  Utilities: Not At Risk (10/30/2022)  Tobacco Use: Medium Risk (11/01/2022)     Readmission Risk Interventions    04/22/2022    3:54 PM  Readmission Risk Prevention Plan  Transportation Screening Complete  Medication Review (RN Care Manager) Complete  PCP or Specialist appointment within 3-5 days of discharge Complete  HRI or Home Care Consult Complete  SW Recovery Care/Counseling Consult Complete  Palliative Care Screening Not Applicable  Skilled Nursing Facility Not Applicable

## 2022-11-07 NOTE — Progress Notes (Signed)
Occupational Therapy Re-Evaluation Patient Details Name: Matthew Zamora MRN: [id number] DOB: 1940/07/01 Today's Date: 11/07/2022   History of present illness Patient is 82 year old gentleman with history of COPD on chronic oxygen therapy 2 L, hypertension, hyperlipidemia, paroxysmal A-fib and therapeutic on Eliquis, history of prostate cancer, coronary artery disease fell on the floor while trying to turn right and found to have right hip fracture. S/p R hip hemiarthoplasty 10/31/22. Found to be tachycardic on 11/06/22 and ICD shocked pt.   OT comments  Matthew Zamora was seen for OT re-evaluation on this date following transfer to progressive cardiac unit. Upon arrival to room pt reclined in bed, agreeable to tx. Pt requires MOD A exit bed, SUP seated UB dressing. CGA + RW sit<>stand x2 from bed height, MIN A + RW bed>chair step t/f. Improved pain from prior session. Goals updated to reflect pt progress, will continue to follow POC. Discharge recommendation remains appropriate.     Recommendations for follow up therapy are one component of a multi-disciplinary discharge planning process, led by the attending physician.  Recommendations may be updated based on patient status, additional functional criteria and insurance authorization.    Assistance Recommended at Discharge Frequent or constant Supervision/Assistance  Patient can return home with the following  Help with stairs or ramp for entrance;A lot of help with walking and/or transfers;A lot of help with bathing/dressing/bathroom;Assistance with cooking/housework   Equipment Recommendations  BSC/3in1;Hospital bed    Recommendations for Other Services      Precautions / Restrictions Precautions Precautions: Fall;Posterior Hip Restrictions Weight Bearing Restrictions: No RLE Weight Bearing: Weight bearing as tolerated       Mobility Bed Mobility Overal bed mobility: Needs Assistance Bed Mobility: Supine to Sit     Supine to  sit: Mod assist          Transfers Overall transfer level: Needs assistance Equipment used: Rolling walker (2 wheels) Transfers: Sit to/from Stand Sit to Stand: Min guard                 Balance Overall balance assessment: Needs assistance, History of Falls Sitting-balance support: No upper extremity supported, Feet supported Sitting balance-Leahy Scale: Good     Standing balance support: Bilateral upper extremity supported Standing balance-Leahy Scale: Fair                             ADL either performed or assessed with clinical judgement   ADL Overall ADL's : Needs assistance/impaired                                       General ADL Comments: MAX A don B socks seated EOB. MIN A + RW simulated BSC t/f. SETUP + SUPERVISION don/doff gown sitting EOB    Extremity/Trunk Assessment Upper Extremity Assessment Upper Extremity Assessment: Generalized weakness   Lower Extremity Assessment Lower Extremity Assessment: Generalized weakness         Cognition Arousal/Alertness: Awake/alert Behavior During Therapy: WFL for tasks assessed/performed Overall Cognitive Status: History of cognitive impairments - at baseline                                                     Pertinent Vitals/ Pain  Pain Assessment Pain Assessment: 0-10 Pain Score: 4  Pain Location: Rt hip Pain Descriptors / Indicators: Grimacing, Tender, Aching Pain Intervention(s): Limited activity within patient's tolerance, Repositioned, Premedicated before session   Frequency  Min 1X/week        Progress Toward Goals  OT Goals(current goals can now be found in the care plan section)  Progress towards OT goals: Progressing toward goals  Acute Rehab OT Goals Patient Stated Goal: to walk OT Goal Formulation: With patient Time For Goal Achievement: 11/21/22 Potential to Achieve Goals: Good ADL Goals Pt Will Perform Lower Body  Bathing: sitting/lateral leans;with min assist Pt Will Perform Lower Body Dressing: with min assist;with caregiver independent in assisting;sit to/from stand Pt Will Transfer to Toilet: with min assist;ambulating;regular height toilet  Plan Discharge plan remains appropriate;Frequency remains appropriate    Co-evaluation                 AM-PAC OT "6 Clicks" Daily Activity     Outcome Measure   Help from another person eating meals?: None Help from another person taking care of personal grooming?: A Little Help from another person toileting, which includes using toliet, bedpan, or urinal?: A Lot Help from another person bathing (including washing, rinsing, drying)?: A Little Help from another person to put on and taking off regular upper body clothing?: A Little Help from another person to put on and taking off regular lower body clothing?: A Lot 6 Click Score: 17    End of Session    OT Visit Diagnosis: Muscle weakness (generalized) (M62.81);History of falling (Z91.81);Pain Pain - Right/Left: Right Pain - part of body: Hip   Activity Tolerance Patient tolerated treatment well;Patient limited by pain   Patient Left in chair;with call bell/phone within reach   Nurse Communication Mobility status        Time: 1610-9604 OT Time Calculation (min): 23 min  Charges: OT General Charges $OT Visit: 1 Visit OT Treatments $Self Care/Home Management : 23-37 mins  Kathie Dike, M.S. OTR/L  11/07/22, 10:24 AM  ascom [phone]

## 2022-11-07 NOTE — Discharge Summary (Signed)
Physician Discharge Summary  Patient: Matthew Zamora ZOX:[id number] DOB: Sep 11, 1940   Code Status: Full Code Admit date: 10/30/2022 Discharge date: 11/07/2022 Disposition: Skilled nursing facility, PT, OT, nurse aid, and RN PCP: Jerl Mina, MD  Recommendations for Outpatient Follow-up:  Follow up with PCP within 1-2 weeks Regarding general hospital follow up and preventative care Recommend monitoring CBC. Required transfusion during admission. Hgb stable on day of discharge: 8.8 Follow up with ortho surgery as directed Post-op care Follow up with cardiology Regarding atrial fibrillation treatment. Discharged with amiodarone 400mg  BID for ten days followed by daily 400mg . Was well controlled prior to dc.   Discharge Diagnoses:  Principal Problem:   Femoral fracture ([name]) Active Problems:   CKD (chronic kidney disease), stage IIIb   Paroxysmal atrial fibrillation ([name])   COPD (chronic obstructive pulmonary disease) ([name])   Normocytic anemia   Acute on chronic combined systolic and diastolic CHF (congestive heart failure) ([name])   Hypotension   GERD without esophagitis   Osteoporosis   Closed displaced fracture of right femoral neck [facility])   [facility] Course Summary: Patient is 82 year old male with PMH COPD on chronic oxygen therapy 2 L, hypertension, hyperlipidemia, coronary artery disease s/p MI with PCI and DES to RCA in 2019, ischemic cardiomyopathy s/p ICD in 2009, ventricular tachycardia, paroxysmal atrial fibrillation  therapeutic on Eliquis, history of prostate cancer.  They presented to the hospital after they fell. Hemodynamically stable in the emergency room. Found to have R hip fracture.   5/16 - underwent Right hip hemiarthroplasty 5/17-5/18: usual post-op recovery and PT evaluation recommending SNF 5/19 - recurrent a-fib mild RVR with HR in 100-110's, occasionally > 120. Resumed home low dose metoprolol. 5/20 - HR's on tele 90's to 110's.  Chest  xray to evaluate cough shows edema.  Lasix 20 mg IV x 1. 5/21: Ortho cleared for discharge. Cardiology consulted for poorly controlled Afib. Pt received ICD shock while experiencing asymptomatic VT/VF and had conversion to sinus rhythm. Remained hemodynamically stable. Of note, patient has had multiple ICD shocks over the past several years. Started on amiodarone gtt and remained stable. 5/23: transitioned to PO amiodarone. Asymptomatic and hemodynamically stable.   Discharge Condition: Stable, improved Recommended discharge diet: Regular healthy diet  Consultations: Cardiology Ortho surgery   Procedures/Studies: R hip fixation ICD shock  Allergies as of 11/07/2022       Reactions   Nitrofuran Derivatives Other (See Comments)   Transaminitis ** confounded w/Amiodarone, Mexiletine   Spironolactone    Significant transaminitis         Medication List     STOP taking these medications    aspirin 81 MG chewable tablet   tadalafil 20 MG tablet Commonly known as: CIALIS       TAKE these medications    (feeding supplement) PROSource Plus liquid Take 30 mLs by mouth 3 (three) times daily between meals.   acetaminophen 325 MG tablet Commonly known as: TYLENOL Take 2 tablets (650 mg total) by mouth every 4 (four) hours as needed for headache or mild pain.   albuterol (2.5 MG/3ML) 0.083% nebulizer solution Commonly known as: PROVENTIL Inhale 3 mLs (2.5 mg total) into the lungs as needed for shortness of breath.   albuterol 108 (90 Base) MCG/ACT inhaler Commonly known as: VENTOLIN HFA Inhale 2 puffs into the lungs every 4 (four) hours as needed.   amiodarone 400 MG tablet Commonly known as: PACERONE Take 1 tablet (400 mg total) by mouth 2 (two)  times daily for 10 days. Followed by 400mg  daily   amiodarone 400 MG tablet Commonly known as: PACERONE Take 1 tablet (400 mg total) by mouth daily. To be started after completing 10 days of the twice daily dosing Start taking  on: November 17, 2022   ascorbic acid 500 MG tablet Commonly known as: VITAMIN C Take 1 tablet by mouth daily.   atorvastatin 80 MG tablet Commonly known as: LIPITOR Take 80 mg by mouth daily.   calcium carbonate 750 MG chewable tablet Commonly known as: TUMS EX Chew 1 tablet by mouth daily.   Eliquis 2.5 MG Tabs tablet Generic drug: apixaban Take 2.5 mg by mouth 2 (two) times daily.   fluticasone-salmeterol 500-50 MCG/ACT Aepb Commonly known as: ADVAIR Inhale 1 puff into the lungs in the morning and at bedtime.   gabapentin 100 MG capsule Commonly known as: NEURONTIN Take 100 mg by mouth at bedtime.   HYDROcodone-acetaminophen 5-325 MG tablet Commonly known as: NORCO/VICODIN Take 1-2 tablets by mouth every 4 (four) hours as needed for moderate pain (pain score 4-6).   magnesium oxide 400 (240 Mg) MG tablet Commonly known as: MAG-OX Take 1 tablet by mouth daily.   melatonin 3 MG Tabs tablet Take 9 mg by mouth at bedtime.   metoCLOPramide 5 MG tablet Commonly known as: REGLAN Take 1 tablet (5 mg total) by mouth every 8 (eight) hours as needed for refractory nausea / vomiting.   metoprolol tartrate 25 MG tablet Commonly known as: LOPRESSOR Take 0.5 tablets (12.5 mg total) by mouth 2 (two) times daily.   midodrine 5 MG tablet Commonly known as: PROAMATINE Take 3 tablets (15 mg total) by mouth 3 (three) times daily with meals.   multivitamin with minerals Tabs tablet Take 1 tablet by mouth daily.   Nebulizer Misc 1 each by Does not apply route as needed.   ondansetron 4 MG disintegrating tablet Commonly known as: ZOFRAN-ODT Take 4 mg by mouth every 8 (eight) hours as needed for refractory nausea / vomiting, vomiting or nausea.   pantoprazole 40 MG tablet Commonly known as: PROTONIX Take 1 tablet (40 mg total) by mouth 2 (two) times daily.   polyethylene glycol 17 g packet Commonly known as: MIRALAX / GLYCOLAX Take 17 g by mouth 2 (two) times daily.    pyridostigmine 60 MG tablet Commonly known as: MESTINON Take 1 tablet (60 mg total) by mouth every 8 (eight) hours.   sertraline 25 MG tablet Commonly known as: ZOLOFT Take 25 mg by mouth daily.   sucralfate 1 GM/10ML suspension Commonly known as: CARAFATE Take 10 mLs (1 g total) by mouth 4 (four) times daily -  with meals and at bedtime.   tamsulosin 0.4 MG Caps capsule Commonly known as: FLOMAX Take 0.4 mg by mouth daily.        Contact information for follow-up providers     Evon Slack, PA-C Follow up in 2 week(s).   Specialties: Orthopedic Surgery, Emergency Medicine Why: For staple removal Contact information: [address] [place] [address] [phone]              Contact information for after-discharge care     Destination     [facility] .   Service: Skilled Nursing Contact information: [address] [facility] [place] [address] [phone]                     Subjective   Pt reports feeling  well today. He denies pain of his hip at rest.  He does have a bloody nose of left nare from his oxygen but spontaneous hemostasis is achieved. Denies respiratory distress. Continues to have mild cough.   All questions and concerns were addressed at time of discharge.  Objective  Blood pressure 109/65, pulse 71, temperature 97.7 F (36.5 C), temperature source Oral, resp. rate 16, height 5\' 8"  (1.727 m), weight 69.4 kg, SpO2 98 %.   General: Pt is alert, awake, not in acute distress HEENT: hemostasis achieved from left nare epistaxis  Cardiovascular: RRR, S1/S2 +, no rubs, no gallops Respiratory: CTA bilaterally, no wheezing, no rhonchi. Mild cough throughout encounter.  Abdominal: Soft, NT, ND, bowel sounds + Extremities: no edema, no cyanosis. Surgical dressing in place.   The results of significant diagnostics from this hospitalization (including imaging, microbiology, ancillary and laboratory)  are listed below for reference.   Imaging studies: DG Chest Port 1 View  Result Date: 11/04/2022 CLINICAL DATA:  Cough EXAM: PORTABLE CHEST 1 VIEW COMPARISON:  X-ray 10/30/2022 FINDINGS: Underinflation compared to the prior x-ray. Stable cardiopericardial silhouette with calcified aorta. Left upper chest defibrillator with leads overlying the right side of the heart. Vascular congestion. No pneumothorax or effusion. Question trace edema. No separate consolidation IMPRESSION: Vascular congestion with question trace edema. Defibrillator. Decreased inflation compared to prior x-ray.  Recommend follow-up Electronically Signed   By: Karen Kays M.D.   On: 11/04/2022 10:38   DG Pelvis Portable  Result Date: 10/31/2022 CLINICAL DATA:  Bipolar right hip hemiarthroplasty. EXAM: PORTABLE PELVIS 1-2 VIEWS COMPARISON:  CT right hip 11/02/2022 FINDINGS: Single frontal view of the bilateral hips. Interval bipolar right hip hemiarthroplasty with normal alignment. Expected postoperative subcutaneous air about the right hip. Mild superomedial left femoroacetabular joint space narrowing. Surgical clips overlie the pelvis. No acute fracture or dislocation. IMPRESSION: Interval bipolar right hip hemiarthroplasty with normal alignment. Electronically Signed   By: Neita Garnet M.D.   On: 10/31/2022 16:23   CT Hip Right Wo Contrast  Result Date: 10/30/2022 CLINICAL DATA:  Right femoral fracture EXAM: CT OF THE RIGHT HIP WITHOUT CONTRAST TECHNIQUE: Multidetector CT imaging of the right hip was performed according to the standard protocol. Multiplanar CT image reconstructions were also generated. RADIATION DOSE REDUCTION: This exam was performed according to the departmental dose-optimization program which includes automated exposure control, adjustment of the mA and/or kV according to patient size and/or use of iterative reconstruction technique. COMPARISON:  Right hips and pelvis radiograph dated 10/30/2022 FINDINGS:  Bones/Joint/Cartilage Acute transcervical fracture of the right proximal femur with apex anterior angulation. The right femoroacetabular joint is intact. Ligaments Suboptimally assessed by CT. Muscles and Tendons Grossly intact. Soft tissues Vascular calcifications of the partially imaged right lower extremity arteries. IMPRESSION: Mildly angulated right femoral transcervical fracture. Electronically Signed   By: Agustin Cree M.D.   On: 10/30/2022 20:49   DG Knee 2 Views Right  Result Date: 10/30/2022 CLINICAL DATA:  Right knee pain, fall EXAM: RIGHT KNEE - 1-2 VIEW COMPARISON:  None Available. FINDINGS: Moderate tricompartment degenerative changes with joint space narrowing and spurring. No joint effusion. No acute bony abnormality. Specifically, no fracture, subluxation, or dislocation. IMPRESSION: Moderate degenerative changes.  No acute bony abnormality. Electronically Signed   By: Charlett Nose M.D.   On: 10/30/2022 20:25   DG Hip Unilat W or Wo Pelvis 2-3 Views Right  Result Date: 10/30/2022 CLINICAL DATA:  Fall, right hip pain EXAM: DG HIP (WITH OR WITHOUT PELVIS)  2-3V RIGHT COMPARISON:  None Available. FINDINGS: There is foreshortening of the right femoral neck with cortical irregularity noted laterally concerning for right femoral neck fracture. No subluxation or dislocation. Mild symmetric degenerative changes in the hips with joint space narrowing and spurring. IMPRESSION: Foreshortening of the right femoral neck with cortical irregularity laterally concerning for femoral neck fracture. Electronically Signed   By: Charlett Nose M.D.   On: 10/30/2022 20:24   DG Chest Portable 1 View  Result Date: 10/30/2022 CLINICAL DATA:  Fall, right hip pain EXAM: PORTABLE CHEST 1 VIEW COMPARISON:  09/14/2022 FINDINGS: Left AICD remains in place, unchanged. Heart is normal size. Mediastinal contours within normal limits. No confluent opacities or effusions. No acute bony abnormality. IMPRESSION: No active disease.  Electronically Signed   By: Charlett Nose M.D.   On: 10/30/2022 20:22   CT HEAD WO CONTRAST ( )  Result Date: 10/30/2022 CLINICAL DATA:  Trip and fall injury. EXAM: CT HEAD WITHOUT CONTRAST CT CERVICAL SPINE WITHOUT CONTRAST TECHNIQUE: Multidetector CT imaging of the head and cervical spine was performed following the standard protocol without intravenous contrast. Multiplanar CT image reconstructions of the cervical spine were also generated. RADIATION DOSE REDUCTION: This exam was performed according to the departmental dose-optimization program which includes automated exposure control, adjustment of the mA and/or kV according to patient size and/or use of iterative reconstruction technique. COMPARISON:  Head CT without contrast 04/17/2022, cervical spine CT 03/27/2022. FINDINGS: CT HEAD FINDINGS Brain: There is mild atrophy, small-vessel disease and atrophic ventriculomegaly. No new asymmetry is seen concerning for an acute infarct, hemorrhage or mass. There is no midline shift. The basal cisterns are clear. Vascular: There are patchy calcifications of the carotid siphons but no hyperdense central vessels. Skull: No fracture or focal lesions.  Mild calvarial osteopenia. Sinuses/Orbits: No acute findings. Clear visualized sinuses and mastoid air cells. Negative orbits apart from old lens replacements. Other: None. CT CERVICAL SPINE FINDINGS Alignment: Unchanged. There is a trace 2 mm C3-4 retrolisthesis, similar minimal C4-5 anterolisthesis and trace C5-6 and C6-7 retrolisthesis all believed degenerative and all unchanged. No traumatic listhesis is seen. There is bone-on-bone anterior atlantodental joint space loss with osteophytes, and degenerative cystic changes in the odontoid process. Skull base and vertebrae: No acute fracture is evident. Bone mineralization is osteopenic. No primary bone lesion or focal pathologic process is seen. Soft tissues and spinal canal: No prevertebral fluid or swelling. No  visible canal hematoma. There are calcifications in both proximal cervical ICAs, heaviest on the left where there is probably a flow-limiting origin stenosis. No laryngeal or thyroid mass. Disc levels: There is moderate disc space loss again at C3-4, C5-6 and C6-7, mild disc narrowing at C3-4, normal disc heights at C2-3 and C7-T1. There are small bidirectional endplate spurs from C3-4 through C6-7, causing partial effacement of the ventral CSF without spondylotic cord compression. Facet joint spurring on the left-greater-than-right is seen at most levels with bilateral uncinate spurring. Acquired foraminal stenosis is mild on the right at C2-3, severe on the left and moderate on the right at C3-4, bilaterally mild-to-moderate C6-7, and not seen at the remaining levels. Upper chest: Pacemaker wiring is partially visible entering from the left. Otherwise negative. Other: None. IMPRESSION: 1. No acute intracranial CT findings or depressed skull fractures. 2. Osteopenia and degenerative change without evidence of cervical fractures or traumatic listhesis. 3. 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 Follow-up as indicated. Electronically Signed   By: Earlean Shawl.D.  On: 10/30/2022 20:15   CT Cervical Spine Wo Contrast  Result Date: 10/30/2022 CLINICAL DATA:  Trip and fall injury. EXAM: CT HEAD WITHOUT CONTRAST CT CERVICAL SPINE WITHOUT CONTRAST TECHNIQUE: Multidetector CT imaging of the head and cervical spine was performed following the standard protocol without intravenous contrast. Multiplanar CT image reconstructions of the cervical spine were also generated. RADIATION DOSE REDUCTION: This exam was performed according to the departmental dose-optimization program which includes automated exposure control, adjustment of the mA and/or kV according to patient size and/or use of iterative reconstruction technique. COMPARISON:  Head CT without contrast  04/17/2022, cervical spine CT 03/27/2022. FINDINGS: CT HEAD FINDINGS Brain: There is mild atrophy, small-vessel disease and atrophic ventriculomegaly. No new asymmetry is seen concerning for an acute infarct, hemorrhage or mass. There is no midline shift. The basal cisterns are clear. Vascular: There are patchy calcifications of the carotid siphons but no hyperdense central vessels. Skull: No fracture or focal lesions.  Mild calvarial osteopenia. Sinuses/Orbits: No acute findings. Clear visualized sinuses and mastoid air cells. Negative orbits apart from old lens replacements. Other: None. CT CERVICAL SPINE FINDINGS Alignment: Unchanged. There is a trace 2 mm C3-4 retrolisthesis, similar minimal C4-5 anterolisthesis and trace C5-6 and C6-7 retrolisthesis all believed degenerative and all unchanged. No traumatic listhesis is seen. There is bone-on-bone anterior atlantodental joint space loss with osteophytes, and degenerative cystic changes in the odontoid process. Skull base and vertebrae: No acute fracture is evident. Bone mineralization is osteopenic. No primary bone lesion or focal pathologic process is seen. Soft tissues and spinal canal: No prevertebral fluid or swelling. No visible canal hematoma. There are calcifications in both proximal cervical ICAs, heaviest on the left where there is probably a flow-limiting origin stenosis. No laryngeal or thyroid mass. Disc levels: There is moderate disc space loss again at C3-4, C5-6 and C6-7, mild disc narrowing at C3-4, normal disc heights at C2-3 and C7-T1. There are small bidirectional endplate spurs from C3-4 through C6-7, causing partial effacement of the ventral CSF without spondylotic cord compression. Facet joint spurring on the left-greater-than-right is seen at most levels with bilateral uncinate spurring. Acquired foraminal stenosis is mild on the right at C2-3, severe on the left and moderate on the right at C3-4, bilaterally mild-to-moderate C6-7, and not  seen at the remaining levels. Upper chest: Pacemaker wiring is partially visible entering from the left. Otherwise negative. Other: None. IMPRESSION: 1. No acute intracranial CT findings or depressed skull fractures. 2. Osteopenia and degenerative change without evidence of cervical fractures or traumatic listhesis. 3. 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 Follow-up as indicated. Electronically Signed   By: Almira Bar M.D.   On: 10/30/2022 20:15   CT Angio Chest PE W/Cm &/Or Wo Cm  Result Date: 10/15/2022 CLINICAL DATA:  Chest pain. EXAM: CT ANGIOGRAPHY CHEST WITH CONTRAST TECHNIQUE: Multidetector CT imaging of the chest was performed using the standard protocol during bolus administration of intravenous contrast. Multiplanar CT image reconstructions and MIPs were obtained to evaluate the vascular anatomy. RADIATION DOSE REDUCTION: This exam was performed according to the departmental dose-optimization program which includes automated exposure control, adjustment of the mA and/or kV according to patient size and/or use of iterative reconstruction technique. CONTRAST:  60mL OMNIPAQUE IOHEXOL 350 MG/ML SOLN COMPARISON:  May 11, 2022. FINDINGS: Cardiovascular: Satisfactory opacification of the pulmonary arteries to the segmental level. No evidence of pulmonary embolism. Normal heart size. No pericardial effusion. Coronary artery calcifications are noted. Mediastinum/Nodes: No  enlarged mediastinal, hilar, or axillary lymph nodes. Thyroid gland, trachea, and esophagus demonstrate no significant findings. Lungs/Pleura: No pneumothorax or pleural effusion is noted. Stable 6 mm nodule is noted in left lower lobe best seen on image number 83 of series 5. 1 cm part solid nodule is noted in right upper lobe best seen on image number 58 of series 5, with 4 mm solid nodule. Minimal right posterior basilar subsegmental atelectasis or scarring is noted. Stable 5  mm nodule is noted in right lower lobe best seen on image number 90 of series 5. Upper Abdomen: No acute abnormality. Musculoskeletal: No chest wall abnormality. No acute or significant osseous findings. Review of the MIP images confirms the above findings. IMPRESSION: No definite evidence of pulmonary embolus. Interval development of 1 cm part solid nodule in right upper lobe. Follow-up non-contrast CT recommended at 3-6 months to confirm persistence. If unchanged, and solid component remains <6 mm, annual CT is recommended until 5 years of stability has been established. If persistent these nodules should be considered highly suspicious if the solid component of the nodule is 6 mm or greater in size and enlarging. This recommendation follows the consensus statement: Guidelines for Management of Incidental Pulmonary Nodules Detected on CT Images: From the Fleischner Society 2017; Radiology 2017; 284:228-243. Stable bilateral lower lobe nodules are noted, the largest measuring 6 mm. Attention to these on follow-up imaging is recommended. Coronary artery calcifications are noted. Aortic Atherosclerosis (ICD10-I70.0) and Emphysema (ICD10-J43.9). Electronically Signed   By: Lupita Raider M.D.   On: 10/15/2022 09:29    Labs: Basic Metabolic Panel: Recent Labs  Lab 11/03/22 0437 11/04/22 0517 11/05/22 0821 11/06/22 0632 11/07/22 0452  NA 134* 136 136 137 132*  K 4.7 4.3 4.0 3.5 4.0  CL 107 106 105 105 100  CO2 22 24 24 25 24   GLUCOSE 77 96 71 97 111*  BUN 19 20 22  24* 26*  CREATININE 1.50* 1.51* 1.28* 1.30* 1.27*  CALCIUM 8.8* 9.2 8.7* 8.5* 8.5*  MG  --  1.9 1.7 1.9 1.9  PHOS 2.9  --   --   --   --    CBC: Recent Labs  Lab 11/03/22 0433 11/04/22 0517 11/05/22 0830 11/06/22 0632 11/06/22 1852 11/07/22 0452  WBC 6.8 6.7 4.2 2.7*  --  7.5  NEUTROABS  --   --   --   --   --  6.6  HGB 7.9* 8.7* 8.2* 7.2* 8.8* 8.8*  HCT 24.7* 27.7* 25.9* 22.6* 26.1* 26.8*  MCV 91.5 93.0 92.2 90.4  --  89.6   PLT 100* 123* 109* 126*  --  157   Microbiology: Results for orders placed or performed during the hospital encounter of 04/17/22  Resp Panel by RT-PCR (Flu A&B, Covid) Anterior Nasal Swab     Status: None   Collection Time: 04/17/22 11:05 AM   Specimen: Anterior Nasal Swab  Result Value Ref Range Status   SARS Coronavirus 2 by RT PCR NEGATIVE NEGATIVE Final    Comment: (NOTE) SARS-CoV-2 target nucleic acids are NOT DETECTED.  The SARS-CoV-2 RNA is generally detectable in upper respiratory specimens during the acute phase of infection. The lowest concentration of SARS-CoV-2 viral copies this assay can detect is 138 copies/mL. A negative result does not preclude SARS-Cov-2 infection and should not be used as the sole basis for treatment or other patient management decisions. A negative result may occur with  improper specimen collection/handling, submission of specimen other than nasopharyngeal swab, presence  of viral mutation(s) within the areas targeted by this assay, and inadequate number of viral copies(<138 copies/mL). A negative result must be combined with clinical observations, patient history, and epidemiological information. The expected result is Negative.  Fact Sheet for Patients:  BloggerCourse.com  Fact Sheet for Healthcare Providers:  SeriousBroker.it  This test is no t yet approved or cleared by the [place] FDA and  has been authorized for detection and/or diagnosis of SARS-CoV-2 by FDA under an Emergency Use Authorization (EUA). This EUA will remain  in effect (meaning this test can be used) for the duration of the COVID-19 declaration under Section 564(b)(1) of the Act, 21 U.S.C.section 360bbb-3(b)(1), unless the authorization is terminated  or revoked sooner.       Influenza A by PCR NEGATIVE NEGATIVE Final   Influenza B by PCR NEGATIVE NEGATIVE Final    Comment: (NOTE) The Xpert Xpress  SARS-CoV-2/FLU/RSV plus assay is intended as an aid in the diagnosis of influenza from Nasopharyngeal swab specimens and should not be used as a sole basis for treatment. Nasal washings and aspirates are unacceptable for Xpert Xpress SARS-CoV-2/FLU/RSV testing.  Fact Sheet for Patients: BloggerCourse.com  Fact Sheet for Healthcare Providers: SeriousBroker.it  This test is not yet approved or cleared by the [place] FDA and has been authorized for detection and/or diagnosis of SARS-CoV-2 by FDA under an Emergency Use Authorization (EUA). This EUA will remain in effect (meaning this test can be used) for the duration of the COVID-19 declaration under Section 564(b)(1) of the Act, 21 U.S.C. section 360bbb-3(b)(1), unless the authorization is terminated or revoked.  Performed at [facility], [address] [place], [place] [address]   Gastrointestinal Panel by PCR , Stool     Status: Abnormal   Collection Time: 04/29/22 10:08 AM   Specimen: Stool  Result Value Ref Range Status   Campylobacter species NOT DETECTED NOT DETECTED Final   Plesimonas shigelloides NOT DETECTED NOT DETECTED Final   Salmonella species NOT DETECTED NOT DETECTED Final   Yersinia enterocolitica NOT DETECTED NOT DETECTED Final   Vibrio species NOT DETECTED NOT DETECTED Final   Vibrio cholerae NOT DETECTED NOT DETECTED Final   Enteroaggregative E coli (EAEC) NOT DETECTED NOT DETECTED Final   Enteropathogenic E coli (EPEC) DETECTED (A) NOT DETECTED Final    Comment: RESULT CALLED TO, READ BACK BY AND VERIFIED WITH: CHRIS BENNETT RN [id number] 04/29/22 HNM    Enterotoxigenic E coli (ETEC) NOT DETECTED NOT DETECTED Final   Shiga like toxin producing E coli (STEC) NOT DETECTED NOT DETECTED Final   Shigella/Enteroinvasive E coli (EIEC) NOT DETECTED NOT DETECTED Final   Cryptosporidium NOT DETECTED NOT DETECTED Final   Cyclospora cayetanensis NOT DETECTED  NOT DETECTED Final   Entamoeba histolytica NOT DETECTED NOT DETECTED Final   Giardia lamblia NOT DETECTED NOT DETECTED Final   Adenovirus F40/41 NOT DETECTED NOT DETECTED Final   Astrovirus NOT DETECTED NOT DETECTED Final   Norovirus GI/GII NOT DETECTED NOT DETECTED Final   Rotavirus A NOT DETECTED NOT DETECTED Final   Sapovirus (I, II, IV, and V) NOT DETECTED NOT DETECTED Final    Comment: Performed at [facility], [address], [place], [place] [address]   Time coordinating discharge: Over 30 minutes  Leeroy Bock, MD  [facility] 11/07/2022, 10:23 AM

## 2022-11-07 NOTE — Progress Notes (Signed)
EPIC Chatted PM Provider again- patient is having increased confusion, verbalized difficulty breathing, and has not yet slept- patient's vitals and telemetry are stable- no major changes- however, patient is beginning to present differently  Concerned over COPD exac. Vs CHF exac. Vs infectious origin (ie aspiration pneumonia)

## 2022-11-10 ENCOUNTER — Inpatient Hospital Stay
Admission: EM | Admit: 2022-11-10 | Discharge: 2022-11-18 | DRG: 190 | Disposition: A | Discharge status: Skilled Nursing Facility | Payer: Medicare Other | Attending: Internal Medicine | Admitting: Internal Medicine

## 2022-11-10 ENCOUNTER — Other Ambulatory Visit: Payer: Self-pay

## 2022-11-10 ENCOUNTER — Encounter: Payer: Self-pay | Admitting: Internal Medicine

## 2022-11-10 ENCOUNTER — Inpatient Hospital Stay: Payer: Medicare Other

## 2022-11-10 ENCOUNTER — Inpatient Hospital Stay (HOSPITAL_COMMUNITY)
Admit: 2022-11-10 | Discharge: 2022-11-10 | Disposition: A | Discharge status: Home / Self Care | Payer: Medicare Other | Attending: Internal Medicine | Admitting: Internal Medicine

## 2022-11-10 ENCOUNTER — Emergency Department: Payer: Medicare Other

## 2022-11-10 DIAGNOSIS — R7989 Other specified abnormal findings of blood chemistry: Secondary | ICD-10-CM

## 2022-11-10 DIAGNOSIS — L89312 Pressure ulcer of right buttock, stage 2: Secondary | ICD-10-CM | POA: Diagnosis present

## 2022-11-10 DIAGNOSIS — H25019 Cortical age-related cataract, unspecified eye: Secondary | ICD-10-CM | POA: Diagnosis present

## 2022-11-10 DIAGNOSIS — E43 Unspecified severe protein-calorie malnutrition: Secondary | ICD-10-CM | POA: Diagnosis present

## 2022-11-10 DIAGNOSIS — I4901 Ventricular fibrillation: Secondary | ICD-10-CM | POA: Diagnosis present

## 2022-11-10 DIAGNOSIS — I4811 Longstanding persistent atrial fibrillation: Secondary | ICD-10-CM

## 2022-11-10 DIAGNOSIS — E785 Hyperlipidemia, unspecified: Secondary | ICD-10-CM | POA: Diagnosis present

## 2022-11-10 DIAGNOSIS — B9789 Other viral agents as the cause of diseases classified elsewhere: Secondary | ICD-10-CM | POA: Diagnosis present

## 2022-11-10 DIAGNOSIS — J9621 Acute and chronic respiratory failure with hypoxia: Secondary | ICD-10-CM | POA: Diagnosis present

## 2022-11-10 DIAGNOSIS — N179 Acute kidney failure, unspecified: Secondary | ICD-10-CM | POA: Diagnosis present

## 2022-11-10 DIAGNOSIS — Z86010 Personal history of colonic polyps: Secondary | ICD-10-CM

## 2022-11-10 DIAGNOSIS — Z9981 Dependence on supplemental oxygen: Secondary | ICD-10-CM

## 2022-11-10 DIAGNOSIS — J441 Chronic obstructive pulmonary disease with (acute) exacerbation: Secondary | ICD-10-CM | POA: Diagnosis present

## 2022-11-10 DIAGNOSIS — Z96641 Presence of right artificial hip joint: Secondary | ICD-10-CM | POA: Diagnosis present

## 2022-11-10 DIAGNOSIS — K219 Gastro-esophageal reflux disease without esophagitis: Secondary | ICD-10-CM | POA: Diagnosis present

## 2022-11-10 DIAGNOSIS — I714 Abdominal aortic aneurysm, without rupture, unspecified: Secondary | ICD-10-CM | POA: Diagnosis present

## 2022-11-10 DIAGNOSIS — I5023 Acute on chronic systolic (congestive) heart failure: Secondary | ICD-10-CM | POA: Diagnosis not present

## 2022-11-10 DIAGNOSIS — I4819 Other persistent atrial fibrillation: Secondary | ICD-10-CM | POA: Diagnosis present

## 2022-11-10 DIAGNOSIS — R7401 Elevation of levels of liver transaminase levels: Secondary | ICD-10-CM | POA: Diagnosis present

## 2022-11-10 DIAGNOSIS — K761 Chronic passive congestion of liver: Secondary | ICD-10-CM | POA: Diagnosis present

## 2022-11-10 DIAGNOSIS — I509 Heart failure, unspecified: Secondary | ICD-10-CM

## 2022-11-10 DIAGNOSIS — Z955 Presence of coronary angioplasty implant and graft: Secondary | ICD-10-CM

## 2022-11-10 DIAGNOSIS — N1831 Chronic kidney disease, stage 3a: Secondary | ICD-10-CM | POA: Diagnosis present

## 2022-11-10 DIAGNOSIS — D649 Anemia, unspecified: Secondary | ICD-10-CM | POA: Diagnosis present

## 2022-11-10 DIAGNOSIS — Z888 Allergy status to other drugs, medicaments and biological substances status: Secondary | ICD-10-CM

## 2022-11-10 DIAGNOSIS — Z7901 Long term (current) use of anticoagulants: Secondary | ICD-10-CM

## 2022-11-10 DIAGNOSIS — L89322 Pressure ulcer of left buttock, stage 2: Secondary | ICD-10-CM | POA: Diagnosis present

## 2022-11-10 DIAGNOSIS — I5043 Acute on chronic combined systolic (congestive) and diastolic (congestive) heart failure: Secondary | ICD-10-CM | POA: Diagnosis present

## 2022-11-10 DIAGNOSIS — I2489 Other forms of acute ischemic heart disease: Secondary | ICD-10-CM | POA: Diagnosis present

## 2022-11-10 DIAGNOSIS — I5021 Acute systolic (congestive) heart failure: Secondary | ICD-10-CM | POA: Diagnosis not present

## 2022-11-10 DIAGNOSIS — Y95 Nosocomial condition: Secondary | ICD-10-CM | POA: Diagnosis present

## 2022-11-10 DIAGNOSIS — I951 Orthostatic hypotension: Secondary | ICD-10-CM | POA: Diagnosis present

## 2022-11-10 DIAGNOSIS — I255 Ischemic cardiomyopathy: Secondary | ICD-10-CM | POA: Diagnosis present

## 2022-11-10 DIAGNOSIS — J440 Chronic obstructive pulmonary disease with acute lower respiratory infection: Secondary | ICD-10-CM | POA: Diagnosis present

## 2022-11-10 DIAGNOSIS — Z6822 Body mass index (BMI) 22.0-22.9, adult: Secondary | ICD-10-CM

## 2022-11-10 DIAGNOSIS — Z1152 Encounter for screening for COVID-19: Secondary | ICD-10-CM

## 2022-11-10 DIAGNOSIS — I130 Hypertensive heart and chronic kidney disease with heart failure and stage 1 through stage 4 chronic kidney disease, or unspecified chronic kidney disease: Secondary | ICD-10-CM | POA: Diagnosis present

## 2022-11-10 DIAGNOSIS — R490 Dysphonia: Secondary | ICD-10-CM | POA: Diagnosis present

## 2022-11-10 DIAGNOSIS — R609 Edema, unspecified: Secondary | ICD-10-CM | POA: Diagnosis present

## 2022-11-10 DIAGNOSIS — I2510 Atherosclerotic heart disease of native coronary artery without angina pectoris: Secondary | ICD-10-CM | POA: Diagnosis present

## 2022-11-10 DIAGNOSIS — Z8546 Personal history of malignant neoplasm of prostate: Secondary | ICD-10-CM

## 2022-11-10 DIAGNOSIS — Z79899 Other long term (current) drug therapy: Secondary | ICD-10-CM

## 2022-11-10 DIAGNOSIS — Z7951 Long term (current) use of inhaled steroids: Secondary | ICD-10-CM

## 2022-11-10 DIAGNOSIS — I252 Old myocardial infarction: Secondary | ICD-10-CM

## 2022-11-10 DIAGNOSIS — I482 Chronic atrial fibrillation, unspecified: Secondary | ICD-10-CM | POA: Diagnosis not present

## 2022-11-10 DIAGNOSIS — Z8249 Family history of ischemic heart disease and other diseases of the circulatory system: Secondary | ICD-10-CM

## 2022-11-10 DIAGNOSIS — Z87891 Personal history of nicotine dependence: Secondary | ICD-10-CM

## 2022-11-10 DIAGNOSIS — Z95810 Presence of automatic (implantable) cardiac defibrillator: Secondary | ICD-10-CM

## 2022-11-10 LAB — RESPIRATORY PANEL BY PCR

## 2022-11-10 LAB — ECHOCARDIOGRAM COMPLETE
AR max vel: 1.51 cm2
AV Peak grad: 10.8 mmHg
Ao pk vel: 1.64 m/s
Area-P 1/2: 3.21 cm2
S' Lateral: 4.2 cm
Single Plane A4C EF: 44.9 %

## 2022-11-10 LAB — BASIC METABOLIC PANEL
Anion gap: 7 (ref 5–15)
BUN: 17 mg/dL (ref 8–23)
CO2: 29 mmol/L (ref 22–32)
Calcium: 8.9 mg/dL (ref 8.9–10.3)
Chloride: 101 mmol/L (ref 98–111)
Creatinine, Ser: 1.17 mg/dL (ref 0.61–1.24)
GFR, Estimated: >60 mL/min (ref >60)
Glucose, Bld: 123 mg/dL — ABNORMAL HIGH (ref 70–99)
Potassium: 3.6 mmol/L (ref 3.5–5.1)
Sodium: 137 mmol/L (ref 135–145)

## 2022-11-10 LAB — CBC WITH DIFFERENTIAL/PLATELET
Abs Immature Granulocytes: 0.06 10*3/uL (ref 0.00–0.07)
Basophils Absolute: 0 10*3/uL (ref 0.0–0.1)
Basophils Relative: 0 %
Eosinophils Absolute: 0 10*3/uL (ref 0.0–0.5)
Eosinophils Relative: 1 %
HCT: 29 % — ABNORMAL LOW (ref 39.0–52.0)
Hemoglobin: 9 g/dL — ABNORMAL LOW (ref 13.0–17.0)
Immature Granulocytes: 1 %
Lymphocytes Relative: 9 %
Lymphs Abs: 0.5 10*3/uL — ABNORMAL LOW (ref 0.7–4.0)
MCH: 29 pg (ref 26.0–34.0)
MCHC: 31 g/dL (ref 30.0–36.0)
MCV: 93.5 fL (ref 80.0–100.0)
Monocytes Absolute: 0.6 10*3/uL (ref 0.1–1.0)
Monocytes Relative: 9 %
Neutro Abs: 4.9 10*3/uL (ref 1.7–7.7)
Neutrophils Relative %: 80 %
Platelets: 193 10*3/uL (ref 150–400)
RBC: 3.1 MIL/uL — ABNORMAL LOW (ref 4.22–5.81)
RDW: 14.3 % (ref 11.5–15.5)
WBC: 6.1 10*3/uL (ref 4.0–10.5)
nRBC: 0 % (ref 0.0–0.2)

## 2022-11-10 LAB — COMPREHENSIVE METABOLIC PANEL
ALT: 121 U/L — ABNORMAL HIGH (ref 0–44)
AST: 220 U/L — ABNORMAL HIGH (ref 15–41)
Albumin: 2.6 g/dL — ABNORMAL LOW (ref 3.5–5.0)
Alkaline Phosphatase: 69 U/L (ref 38–126)
Anion gap: 5 (ref 5–15)
BUN: 18 mg/dL (ref 8–23)
CO2: 28 mmol/L (ref 22–32)
Calcium: 8.7 mg/dL — ABNORMAL LOW (ref 8.9–10.3)
Chloride: 103 mmol/L (ref 98–111)
Creatinine, Ser: 1.14 mg/dL (ref 0.61–1.24)
GFR, Estimated: >60 mL/min (ref >60)
Glucose, Bld: 110 mg/dL — ABNORMAL HIGH (ref 70–99)
Potassium: 3.5 mmol/L (ref 3.5–5.1)
Sodium: 136 mmol/L (ref 135–145)
Total Bilirubin: 1 mg/dL (ref 0.3–1.2)
Total Protein: 5.4 g/dL — ABNORMAL LOW (ref 6.5–8.1)

## 2022-11-10 LAB — TROPONIN I (HIGH SENSITIVITY)
Troponin I (High Sensitivity): 17 ng/L (ref <18)
Troponin I (High Sensitivity): 19 ng/L — ABNORMAL HIGH (ref <18)

## 2022-11-10 LAB — BLOOD GAS, VENOUS
Acid-Base Excess: 3.2 mmol/L — ABNORMAL HIGH (ref 0.0–2.0)
Bicarbonate: 29.9 mmol/L — ABNORMAL HIGH (ref 20.0–28.0)
O2 Saturation: 61.4 %
Patient temperature: 37
pCO2, Ven: 53 mmHg (ref 44–60)
pH, Ven: 7.36 (ref 7.25–7.43)
pO2, Ven: 37 mmHg (ref 32–45)

## 2022-11-10 LAB — MRSA NEXT GEN BY PCR, NASAL: MRSA by PCR Next Gen: DETECTED — AB

## 2022-11-10 LAB — LACTIC ACID, PLASMA
Lactic Acid, Venous: 1.4 mmol/L (ref 0.5–1.9)
Lactic Acid, Venous: 2.5 mmol/L (ref 0.5–1.9)

## 2022-11-10 LAB — BRAIN NATRIURETIC PEPTIDE: B Natriuretic Peptide: 1304.2 pg/mL — ABNORMAL HIGH (ref 0.0–100.0)

## 2022-11-10 LAB — PROCALCITONIN: Procalcitonin: <0.1 ng/mL

## 2022-11-10 LAB — PHOSPHORUS: Phosphorus: 3.3 mg/dL (ref 2.5–4.6)

## 2022-11-10 LAB — MAGNESIUM: Magnesium: 1.7 mg/dL (ref 1.7–2.4)

## 2022-11-10 LAB — SARS CORONAVIRUS 2 BY RT PCR: SARS Coronavirus 2 by RT PCR: NEGATIVE

## 2022-11-10 MED ORDER — ALBUTEROL SULFATE (2.5 MG/3ML) 0.083% IN NEBU: 3.0000 mL | INHALATION_SOLUTION | RESPIRATORY_TRACT | Status: DC | PRN

## 2022-11-10 MED ORDER — SODIUM CHLORIDE 0.9 % IV SOLN
1.0000 g | Freq: Every day | INTRAVENOUS | Status: DC
  Administered 2022-11-10: 1 g via INTRAVENOUS
  Filled 2022-11-10: qty 10

## 2022-11-10 MED ORDER — FUROSEMIDE 10 MG/ML IJ SOLN
40.0000 mg | Freq: Once | INTRAMUSCULAR | Status: AC
  Administered 2022-11-10: 40 mg via INTRAVENOUS
  Filled 2022-11-10: qty 4

## 2022-11-10 MED ORDER — METOPROLOL TARTRATE 5 MG/5ML IV SOLN
2.5000 mg | Freq: Once | INTRAVENOUS | Status: AC | PRN
  Administered 2022-11-10: 2.5 mg via INTRAVENOUS
  Filled 2022-11-10: qty 5

## 2022-11-10 MED ORDER — LEVALBUTEROL HCL 0.63 MG/3ML IN NEBU
0.6300 mg | INHALATION_SOLUTION | Freq: Four times a day (QID) | RESPIRATORY_TRACT | Status: DC | PRN
  Administered 2022-11-10 – 2022-11-11 (×3): 0.63 mg via RESPIRATORY_TRACT
  Filled 2022-11-10 (×3): qty 3

## 2022-11-10 MED ORDER — ONDANSETRON HCL 4 MG/2ML IJ SOLN: 4.0000 mg | Freq: Four times a day (QID) | INTRAMUSCULAR | Status: DC | PRN

## 2022-11-10 MED ORDER — METOPROLOL TARTRATE 5 MG/5ML IV SOLN
2.5000 mg | Freq: Once | INTRAVENOUS | Status: AC
  Administered 2022-11-10: 2.5 mg via INTRAVENOUS
  Filled 2022-11-10: qty 5

## 2022-11-10 MED ORDER — SODIUM CHLORIDE 0.9 % IV SOLN: INTRAVENOUS | Status: DC

## 2022-11-10 MED ORDER — SODIUM CHLORIDE 0.9 % IV SOLN
2.0000 g | Freq: Two times a day (BID) | INTRAVENOUS | Status: DC
  Administered 2022-11-10 – 2022-11-11 (×3): 2 g via INTRAVENOUS
  Filled 2022-11-10 (×3): qty 12.5

## 2022-11-10 MED ORDER — POTASSIUM CHLORIDE 10 MEQ/100ML IV SOLN
10.0000 meq | INTRAVENOUS | Status: AC
  Administered 2022-11-10 (×2): 10 meq via INTRAVENOUS
  Filled 2022-11-10 (×2): qty 100

## 2022-11-10 MED ORDER — FUROSEMIDE 10 MG/ML IJ SOLN
40.0000 mg | Freq: Two times a day (BID) | INTRAMUSCULAR | Status: DC
  Administered 2022-11-10 – 2022-11-13 (×6): 40 mg via INTRAVENOUS
  Filled 2022-11-10 (×6): qty 4

## 2022-11-10 MED ORDER — ONDANSETRON HCL 4 MG PO TABS
4.0000 mg | ORAL_TABLET | Freq: Four times a day (QID) | ORAL | Status: DC | PRN
  Administered 2022-11-13 – 2022-11-14 (×2): 4 mg via ORAL
  Filled 2022-11-10 (×2): qty 1

## 2022-11-10 MED ORDER — MAGNESIUM SULFATE 2 GM/50ML IV SOLN
2.0000 g | Freq: Once | INTRAVENOUS | Status: AC
  Administered 2022-11-10: 2 g via INTRAVENOUS
  Filled 2022-11-10: qty 50

## 2022-11-10 MED ORDER — METHYLPREDNISOLONE SODIUM SUCC 125 MG IJ SOLR
60.0000 mg | Freq: Once | INTRAMUSCULAR | Status: AC
  Administered 2022-11-10: 60 mg via INTRAVENOUS
  Filled 2022-11-10: qty 2

## 2022-11-10 MED ORDER — VANCOMYCIN HCL IN DEXTROSE 1-5 GM/200ML-% IV SOLN: 1000.0000 mg | INTRAVENOUS | Status: DC

## 2022-11-10 MED ORDER — PREDNISONE 20 MG PO TABS
40.0000 mg | ORAL_TABLET | Freq: Every day | ORAL | Status: DC
  Administered 2022-11-13: 40 mg via ORAL
  Filled 2022-11-10 (×2): qty 2

## 2022-11-10 MED ORDER — IPRATROPIUM-ALBUTEROL 0.5-2.5 (3) MG/3ML IN SOLN
3.0000 mL | Freq: Four times a day (QID) | RESPIRATORY_TRACT | Status: DC
  Administered 2022-11-10 (×2): 3 mL via RESPIRATORY_TRACT
  Filled 2022-11-10: qty 3

## 2022-11-10 MED ORDER — IPRATROPIUM-ALBUTEROL 0.5-2.5 (3) MG/3ML IN SOLN
3.0000 mL | Freq: Once | RESPIRATORY_TRACT | Status: AC
  Administered 2022-11-10: 3 mL via RESPIRATORY_TRACT
  Filled 2022-11-10: qty 3

## 2022-11-10 MED ORDER — MORPHINE SULFATE (PF) 2 MG/ML IV SOLN
2.0000 mg | Freq: Once | INTRAVENOUS | Status: AC
  Administered 2022-11-10: 2 mg via INTRAVENOUS
  Filled 2022-11-10: qty 1

## 2022-11-10 MED ORDER — METOPROLOL TARTRATE 25 MG PO TABS
12.5000 mg | ORAL_TABLET | Freq: Once | ORAL | Status: AC
  Administered 2022-11-10: 12.5 mg via ORAL
  Filled 2022-11-10: qty 1

## 2022-11-10 MED ORDER — KETOROLAC TROMETHAMINE 15 MG/ML IJ SOLN
15.0000 mg | Freq: Once | INTRAMUSCULAR | Status: AC
  Administered 2022-11-10: 15 mg via INTRAVENOUS
  Filled 2022-11-10: qty 1

## 2022-11-10 MED ORDER — ORAL CARE MOUTH RINSE: 15.0000 mL | OROMUCOSAL | Status: DC | PRN

## 2022-11-10 MED ORDER — VANCOMYCIN HCL 1500 MG/300ML IV SOLN
1500.0000 mg | Freq: Once | INTRAVENOUS | Status: AC
  Administered 2022-11-10: 1500 mg via INTRAVENOUS
  Filled 2022-11-10: qty 300

## 2022-11-10 MED ORDER — METHYLPREDNISOLONE SODIUM SUCC 40 MG IJ SOLR
40.0000 mg | Freq: Two times a day (BID) | INTRAMUSCULAR | Status: AC
  Administered 2022-11-10 – 2022-11-12 (×4): 40 mg via INTRAVENOUS
  Filled 2022-11-10 (×4): qty 1

## 2022-11-10 MED ORDER — METOPROLOL TARTRATE 5 MG/5ML IV SOLN: 2.5000 mg | Freq: Once | INTRAVENOUS | Status: DC

## 2022-11-10 NOTE — H&P (Signed)
History and Physical    Matthew Zamora:[id number] DOB: 05-10-1941 DOA: 11/10/2022  PCP: Jerl Mina, MD  Patient coming from: rehab  I have personally briefly reviewed patient's old medical records in [facility]  Chief Complaint: sob  HPI: Matthew Zamora is a 82 y.o. male with medical history significant of  COPD on chronic oxygen therapy 2 L, hypertension, hyperlipidemia, coronary artery disease s/p MI with PCI and DES to RCA in 2019, ischemic cardiomyopathy s/p ICD in 2009, ventricular tachycardia on amiodarone, paroxysmal atrial fibrillation on Eliquis history of prostate cancer who has interim history of hospitalization 10/30/22 -11/07/2022 at which time was diagnosed with  right femoral fracture for which he underwent right hip hemiarthroplasty  on 5/16.  Hospital course was further complicated by recurrent afib, CHF exacerbation. Patient was seen by cardiology for poorly consulted afib. Of note patient also had asymptomatic VT/VF and received ICD shock s/p which patient converted to sinus rhythm. Due to this and hx of frequent ICD discharge patient was started on amiodarone drip and transitioned to po on discharge. Patient returns 3 days after discharge with increase sob. Per patient sob started this am. He notes associated cough  and congestion. Per patient states mild cough started prior to discharge. He notes no associated fever/chills/ chest pain / or abdominal pain. He notes + fatigue/doe, no n/v/dysuria but does endorse loose stools over the last 2 days.He also noted no further discharge of his ICD.  ED Course:  Vitals: afeb, bp 138/81, hr 111, rr25 sat 93% on ra  EKG afib  105  Wbc 6.1, hgb 9 at baseline  mcv 93.5 Plt 193 Na 136, K 3.5, co2 28, glu 110, cr 1.14 ast 220 (61) alt 121 (28) [id number] ( 806) Covid:neg WJX:BJYNWGNFAO: 1. Small bilateral pleural effusions and increased pulmonary vascularity concerning for early CHF.  Tx: solumedrol, douneb, lasix 40  mg iv x 1 lopressor tab 12.5 mg po  Patient found to have afib with rvr, aecopd as well as early chf   Review of Systems: As per HPI otherwise 10 point review of systems negative.   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 pylori infection    History of hepatitis    Hyperlipidemia    Hypertension    Liver cyst 05/16/15   PAF (paroxysmal atrial fibrillation) ([name])    Prostate CA [facility])     Past Surgical History:  Procedure Laterality Date   CATARACT EXTRACTION     COLONOSCOPY  10/06/2014, 09/18/2004, 03/14/2009   ESOPHAGOGASTRODUODENOSCOPY  10/06/2014, 03/18/2001, 03/14/2009   ESOPHAGOGASTRODUODENOSCOPY (EGD) WITH PROPOFOL N/A 05/07/2018   Procedure: ESOPHAGOGASTRODUODENOSCOPY (EGD) WITH PROPOFOL;  Surgeon: Toledo, Boykin Nearing, MD;  Location: [facility] ENDOSCOPY;  Service: Gastroenterology;  Laterality: N/A;   ESOPHAGOGASTRODUODENOSCOPY (EGD) WITH PROPOFOL N/A 04/22/2022   Procedure: ESOPHAGOGASTRODUODENOSCOPY (EGD) WITH PROPOFOL;  Surgeon: Toney Reil, MD;  Location: [facility] ENDOSCOPY;  Service: Endoscopy;  Laterality: N/A;   FLEXIBLE SIGMOIDOSCOPY  08/26/1990   HIP ARTHROPLASTY Right 10/31/2022   Procedure: ARTHROPLASTY BIPOLAR HIP (HEMIARTHROPLASTY)-RNFA;  Surgeon: Reinaldo Berber, MD;  Location: [facility] ORS;  Service: Orthopedics;  Laterality: Right;  INSERTION OF ICD     PROSTATE SURGERY     TONSILLECTOMY       reports that he quit smoking about 23 years ago. His smoking use included cigarettes. He has a 38.00 pack-year smoking history. He has never used smokeless tobacco. He reports that he does not currently use drugs. He reports that he does not  drink alcohol.  Allergies  Allergen Reactions   Nitrofuran Derivatives Other (See Comments)    Transaminitis ** confounded w/Amiodarone, Mexiletine   Spironolactone     Significant transaminitis     Family History  Problem Relation Age of Onset   Heart attack Mother    Heart attack Father     Prior to Admission medications   Medication Sig Start Date End Date Taking? Authorizing Provider  acetaminophen (TYLENOL) 325 MG tablet Take 2 tablets (650 mg total) by mouth every 4 (four) hours as needed for headache or mild pain. 10/29/19   Judithe Modest, NP  albuterol (PROVENTIL) (2.5 MG/3ML) 0.083% nebulizer solution Inhale 3 mLs (2.5 mg total) into the lungs as needed for shortness of breath. 10/29/19   Judithe Modest, NP  albuterol (VENTOLIN HFA) 108 (90 Base) MCG/ACT inhaler Inhale 2 puffs into the lungs every 4 (four) hours as needed. 04/21/21   [provider]  amiodarone (PACERONE) 400 MG tablet Take 1 tablet (400 mg total) by mouth 2 (two) times daily for 10 days. Followed by 400mg  daily 11/07/22 11/17/22  Leeroy Bock, MD  amiodarone (PACERONE) 400 MG tablet Take 1 tablet (400 mg total) by mouth daily. To be started after completing 10 days of the twice daily dosing 11/17/22 12/17/22  Leeroy Bock, MD  ascorbic acid (VITAMIN C) 500 MG tablet Take 1 tablet by mouth daily.    [provider]  atorvastatin (LIPITOR) 80 MG tablet Take 80 mg by mouth daily. 05/13/21   [provider]  calcium carbonate (TUMS EX) 750 MG chewable tablet Chew 1 tablet by mouth daily.    [provider]  ELIQUIS 2.5 MG TABS tablet Take 2.5 mg by mouth 2 (two) times daily. 12/28/21   [provider]  fluticasone-salmeterol (ADVAIR) 500-50 MCG/ACT AEPB Inhale 1 puff into the lungs in the morning and at bedtime. 04/05/22   [provider]  gabapentin (NEURONTIN) 100 MG capsule Take 100 mg by mouth at bedtime. 12/09/19   [provider]   HYDROcodone-acetaminophen (NORCO/VICODIN) 5-325 MG tablet Take 1-2 tablets by mouth every 4 (four) hours as needed for moderate pain (pain score 4-6). 11/02/22   Dedra Skeens, PA-C  magnesium oxide (MAG-OX) 400 (240 Mg) MG tablet Take 1 tablet by mouth daily. 02/05/21   [provider]  melatonin 3 MG TABS tablet Take 9 mg by mouth at bedtime.    [provider]  metoCLOPramide (REGLAN) 5 MG tablet Take 1 tablet (5 mg total) by mouth every 8 (eight) hours as needed for refractory nausea / vomiting. 05/13/22   Delfino Lovett, MD  metoprolol tartrate (LOPRESSOR) 25 MG tablet Take 0.5 tablets (12.5 mg total) by mouth 2 (two) times daily. 11/07/22   Leeroy Bock, MD  midodrine (PROAMATINE) 5 MG tablet Take 3 tablets (15 mg total) by mouth 3 (three) times daily with meals. 11/07/22   Leeroy Bock, MD  Multiple Vitamin (MULTIVITAMIN WITH MINERALS) TABS tablet Take 1 tablet by mouth daily. 10/29/19   Judithe Modest, NP  Nebulizer MISC 1 each by Does not apply route as  needed. 06/16/21   Rolly Salter, MD  Nutritional Supplements (,FEEDING SUPPLEMENT, PROSOURCE PLUS) liquid Take 30 mLs by mouth 3 (three) times daily between meals. 05/13/22   Delfino Lovett, MD  ondansetron (ZOFRAN-ODT) 4 MG disintegrating tablet Take 4 mg by mouth every 8 (eight) hours as needed for refractory nausea / vomiting, vomiting or nausea.    [provider]  pantoprazole (PROTONIX) 40 MG tablet Take 1 tablet (40 mg total) by mouth 2 (two) times daily. 05/13/22   Delfino Lovett, MD  polyethylene glycol (MIRALAX / GLYCOLAX) 17 g packet Take 17 g by mouth 2 (two) times daily. 11/07/22   Leeroy Bock, MD  pyridostigmine (MESTINON) 60 MG tablet Take 1 tablet (60 mg total) by mouth every 8 (eight) hours. 05/13/22   Delfino Lovett, MD  sertraline (ZOLOFT) 25 MG tablet Take 25 mg by mouth daily.    [provider]  sucralfate (CARAFATE) 1 GM/10ML suspension Take 10 mLs (1 g total) by mouth 4  (four) times daily -  with meals and at bedtime. 05/13/22   Delfino Lovett, MD  tamsulosin (FLOMAX) 0.4 MG CAPS capsule Take 0.4 mg by mouth daily. 12/09/19   [provider]    Physical Exam: Vitals:   11/10/22 0914 11/10/22 0916 11/10/22 0930 11/10/22 1032  BP:  138/81 138/64 126/82  Pulse:  (!) 111 (!) 120 (!) 115  Resp:  (!) 25 (!) 29   Temp:  98.1 F (36.7 C)    TempSrc:  Oral    SpO2: 94% 93%      Constitutional: NAD, calm, comfortable Vitals:   11/10/22 0914 11/10/22 0916 11/10/22 0930 11/10/22 1032  BP:  138/81 138/64 126/82  Pulse:  (!) 111 (!) 120 (!) 115  Resp:  (!) 25 (!) 29   Temp:  98.1 F (36.7 C)    TempSrc:  Oral    SpO2: 94% 93%     Eyes: PERRL, lids and conjunctivae normal ENMT: Mucous membranes are moist. Posterior pharynx clear of any exudate or lesions.Normal dentition.  Neck: normal, supple, no masses, no thyromegaly Respiratory: + wheezing, + rhonchi ,increase respiratory effort. + mild mod accessory muscle use.  Cardiovascular: tachycardic rate and irregular rhythm, no murmurs / rubs / gallops. No extremity edema. 2+ pedal pulses.  Abdomen: no tenderness, no masses palpated. No hepatosplenomegaly. Bowel sounds positive.  Musculoskeletal: no clubbing / cyanosis. No joint deformity upper and lower extremities. Good ROM, no contractures. Normal muscle tone.  Skin: no rashes, lesions, ulcers. No induration Neurologic: CN 2-12 grossly intact. Sensation intact,  MAEx4 Psychiatric: Normal judgment and insight. Alert and oriented x 3. Normal mood.    Labs on Admission: I have personally reviewed following labs and imaging studies  CBC: Recent Labs  Lab 11/04/22 0517 11/05/22 0830 11/06/22 0632 11/06/22 1852 11/07/22 0452 11/10/22 0920  WBC 6.7 4.2 2.7*  --  7.5 6.1  NEUTROABS  --   --   --   --  6.6 4.9  HGB 8.7* 8.2* 7.2* 8.8* 8.8* 9.0*  HCT 27.7* 25.9* 22.6* 26.1* 26.8* 29.0*  MCV 93.0 92.2 90.4  --  89.6 93.5  PLT 123* 109* 126*  --  157  193   Basic Metabolic Panel: Recent Labs  Lab 11/04/22 0517 11/05/22 0821 11/06/22 0632 11/07/22 0452 11/10/22 0920  NA 136 136 137 132* 136  K 4.3 4.0 3.5 4.0 3.5  CL 106 105 105 100 103  CO2 24 24 25 24 28   GLUCOSE 96 71  97 111* 110*  BUN 20 22 24* 26* 18  CREATININE 1.51* 1.28* 1.30* 1.27* 1.14  CALCIUM 9.2 8.7* 8.5* 8.5* 8.7*  MG 1.9 1.7 1.9 1.9  --    GFR: Estimated Creatinine Clearance: 50 mL/min (by C-G formula based on SCr of 1.14 mg/dL). Liver Function Tests: Recent Labs  Lab 11/05/22 0821 11/07/22 0452 11/10/22 0920  AST 47* 61* 220*  ALT 22 28 121*  ALKPHOS 51 53 69  BILITOT 0.9 0.9 1.0  PROT 5.1* 5.3* 5.4*  ALBUMIN 2.7* 2.6* 2.6*   No results for input(s): "LIPASE", "AMYLASE" in the last 168 hours. No results for input(s): "AMMONIA" in the last 168 hours. Coagulation Profile: No results for input(s): "INR", "PROTIME" in the last 168 hours. Cardiac Enzymes: No results for input(s): "CKTOTAL", "CKMB", "CKMBINDEX", "TROPONINI" in the last 168 hours. BNP (last 3 results) No results for input(s): "PROBNP" in the last 8760 hours. HbA1C: No results for input(s): "HGBA1C" in the last 72 hours. CBG: No results for input(s): "GLUCAP" in the last 168 hours. Lipid Profile: No results for input(s): "CHOL", "HDL", "LDLCALC", "TRIG", "CHOLHDL", "LDLDIRECT" in the last 72 hours. Thyroid Function Tests: No results for input(s): "TSH", "T4TOTAL", "FREET4", "T3FREE", "THYROIDAB" in the last 72 hours. Anemia Panel: No results for input(s): "VITAMINB12", "FOLATE", "FERRITIN", "TIBC", "IRON", "RETICCTPCT" in the last 72 hours. Urine analysis:    Component Value Date/Time   COLORURINE YELLOW (A) 04/17/2022 1105   APPEARANCEUR HAZY (A) 04/17/2022 1105   LABSPEC 1.008 04/17/2022 1105   PHURINE 7.0 04/17/2022 1105   GLUCOSEU 50 (A) 04/17/2022 1105   HGBUR NEGATIVE 04/17/2022 1105   BILIRUBINUR NEGATIVE 04/17/2022 1105   KETONESUR NEGATIVE 04/17/2022 1105   PROTEINUR  NEGATIVE 04/17/2022 1105   NITRITE NEGATIVE 04/17/2022 1105   LEUKOCYTESUR NEGATIVE 04/17/2022 1105    Radiological Exams on Admission: DG Chest 2 View  Result Date: 11/10/2022 CLINICAL DATA:  Shortness of breath. EXAM: CHEST - 2 VIEW COMPARISON:  10/30/22 FINDINGS: Left chest wall ICD noted with leads in the right atrial appendage and right ventricle. Heart size is normal. Aortic atherosclerosis. Small bilateral posterior layering pleural effusions are identified on the lateral projection radiograph. Increased pulmonary vascularity. No frank edema. No airspace consolidation. IMPRESSION: 1. Small bilateral pleural effusions and increased pulmonary vascularity concerning for early CHF. Electronically Signed   By: Signa Kell M.D.   On: 11/10/2022 10:04    EKG: Independently reviewed.   Assessment/Plan  HCAP associated with Acute COPD exacerbation with acute on chronic hypoxic respiratory failure  -baseline O2 2L -admit to progressive care  - solumedrol iv , taper to prednisone per protocol - vanc/cefepime -f/u on sputum cultures  -CT chest suggestive of pna -urine ag/blood cultures - nebs standing and prn  -resume chronic inhalers  -pulmonary toilet  -wean O2 to baseline 2L  as able     Acute on chronic CHF exacerbation ref  with associated acute hypoxic on chronic respiratory failure -ef 30-35% 2023  -lasix 40 mg iv bid  -cycle ce, check tsh, echo update in am   -ekg without hyperacute st -twave changes  -cardiology consult in am   Elevated Lfts -unclear cause , possible medication vs hepatocongestion  Afib RVR -continue on Eliquis  - continue low dose metoprolol  - holding amiodarone due to elevated flts   VT/VF  -s/p ICD  - hx of frequent icd discharge  -started on amiodarone last admission due to episode of VT/VF asymptomatic    Anemia -post op hip  repair 2 weeks ago  - h/h stable  - continue to monitor  -check iron panel  to be complete   CAD s/p MI   -DES RCA 2019 -no active chest pain  - ce pending  -resume home regimen as able   Recent Hx of Right hip fx s/p repair -PT  HLD -resume statin   Protein Cal  Malnutrition  - nutrition consult   DVT prophylaxis: on eliquis Code Status: full/ as discussed per patient wishes in event of cardiac arrest  Family Communication:  none at bedside Disposition Plan: patient  expected to be admitted greater than 2 midnights  Consults called: Cardiology Admission status: progressive   Lurline Del MD [facility]   If 7PM-7AM, please contact night-coverage [email or web address] Password [facility]  11/10/2022, 11:26 AM

## 2022-11-10 NOTE — Progress Notes (Signed)
Echocardiogram 2D Echocardiogram has been performed.  Lenor Coffin 11/10/2022, 3:43 PM

## 2022-11-10 NOTE — ED Notes (Signed)
The pt was assisted with changing his brief and removing dirty linen. The pt did appear to have redness and mild skin breakdown present at his coccyx region. Pt assisted without incident and call bell placed at the pt's side.

## 2022-11-10 NOTE — ED Triage Notes (Signed)
Pt to ED via [facility] from [facility] for c/o chest pain, shortness of breath. 1 sublingual nitro administered by facility. Pt c/o cough x2 days. EMS reports crackles and wheeze on right side. Pt has has hx of COPD, CHF, wears 2L at baseline. Pt had surgery on Tuesday for a right hip replacement.  98% on 4L 12 lead unremarkable

## 2022-11-10 NOTE — ED Notes (Signed)
A male purwick was placed on the pt.

## 2022-11-10 NOTE — ED Provider Notes (Addendum)
[facility] Provider Note    Event Date/Time   First MD Initiated Contact with Patient 11/10/22 [phone]     (approximate)   History   Chest Pain and Shortness of Breath   HPI  Matthew Zamora is a 82 y.o. 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  Patient says yesterday he was a little bit dyspneic but then today felt significantly short of breath.  He has a cough but tells me this is chronic.  Does have chest tightness with coughing but none in between episodes of coughing.  Denies any obvious lower extremity swelling that he has noted.  He denies fevers or chills.  He is on 2 L nasal cannula at baseline.  Per triage documentation of discussion with EMS patient received 1 nitroglycerin at facility for chest pain and also received 1 DuoNeb and albuterol neb with EMS.  I reviewed patient's recent hospitalization for right hip fracture.  Underwent hemiarthroplasty on 5/16.  His hospital course was complicated by A-fib with RVR as well as some CHF and an episode of VT/VF during which she underwent an ICD shock.  Was on an amiodarone drip and was transitioned to p.o. amiodarone.     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 pylori infection    History of hepatitis    Hyperlipidemia    Hypertension    Liver cyst 05/16/15   PAF (paroxysmal atrial fibrillation) ([name])    Prostate CA [facility])     Patient Active Problem List   Diagnosis Date Noted   Fall 11/06/2022   Closed displaced  fracture of right femoral neck ([name]) 10/31/2022   Osteoporosis 10/30/2022   Femoral fracture ([name]) 10/30/2022   Epistaxis 05/12/2022   Protein-calorie malnutrition, severe 05/08/2022   Enteritis, enteropathogenic E. coli 04/29/2022   Diarrhea 04/28/2022   Multifocal pneumonia 04/23/2022   Erosive esophagitis 04/22/2022   Dizziness 04/21/2022   Intractable nausea and vomiting 04/21/2022   Dyspnea 04/20/2022   Hyponatremia 04/19/2022   Orthostatic hypotension 04/18/2022   Postural dizziness with near syncope 04/18/2022   Near syncope 04/17/2022   COPD (chronic obstructive pulmonary disease) ([name]) 04/17/2022   Hyperkalemia 04/17/2022   Abnormal LFTs 04/17/2022   Chronic systolic CHF (congestive heart failure) ([name]) 04/17/2022   Normocytic anemia 04/17/2022   GERD without esophagitis 02/12/2022   Recurrent falls    Generalized weakness    NSVT (nonsustained ventricular tachycardia) ([name]) 02/11/2022   Acute on chronic combined systolic (congestive) and diastolic (congestive) heart failure ([name]) 08/10/2021   Acute respiratory failure with hypoxia ([name])    HCAP (healthcare-associated pneumonia) 06/13/2021   Acute on chronic systolic CHF (congestive heart failure) ([name]) 06/13/2021   Elevated troponin 06/13/2021   CKD (chronic kidney disease), 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   Lung nodule 06/13/2021   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   COPD exacerbation ([name]) 06/13/2021   Volume depletion 12/15/2019   Hypotension 12/15/2019   Cardiorenal syndrome    Atrial fibrillation with RVR ([name])  Defibrillator discharge    Tachycardia 10/25/2019   CHF (congestive heart failure) ([name]) 09/18/2019   Acute on chronic combined systolic and diastolic CHF (congestive heart failure) ([name]) 09/17/2019   CAD (coronary artery disease) 09/17/2019   Ischemic cardiomyopathy 09/17/2019   CKD (chronic kidney disease) stage 3, GFR 30-59 ml/min ([name]) 09/17/2019   Acute hypoxemic respiratory failure ([name]) 09/17/2018   AAA  (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 ([name]) 07/06/2018   Essential hypertension 07/06/2018   Hyperlipidemia 07/06/2018   Paroxysmal atrial fibrillation ([name])    Acute on chronic respiratory failure with hypoxia ([name]) 04/26/2018     Physical Exam  Triage Vital Signs: ED Triage Vitals  Enc Vitals Group     BP 11/10/22 0916 138/81     Pulse Rate 11/10/22 0916 (!) 111     Resp 11/10/22 0916 (!) 25     Temp 11/10/22 0916 98.1 F (36.7 C)     Temp Source 11/10/22 0916 Oral     SpO2 11/10/22 0914 94 %     Weight --      Height --      Head Circumference --      Peak Flow --      Pain Score 11/10/22 0917 8     Pain Loc --      Pain Edu? --      Excl. in GC? --     Most recent vital signs: Vitals:   11/10/22 0930 11/10/22 1032  BP: 138/64 126/82  Pulse: (!) 120 (!) 115  Resp: (!) 29   Temp:    SpO2:       General: Awake, no distress. Chronically ill appearing CV:  Good peripheral perfusion. No edema Resp:  Mildly tachypneic with conversational dyspnea, aphasic wheezing with decreased air movement and some crackles at the bases Abd:  No distention.  Neuro:             Awake, Alert, Oriented x 3  Other:     ED Results / Procedures / Treatments  Labs (all labs ordered are listed, but only abnormal results are displayed) Labs Reviewed  COMPREHENSIVE METABOLIC PANEL - Abnormal; Notable for the following components:      Result Value   Glucose, Bld 110 (*)    Calcium 8.7 (*)    Total Protein 5.4 (*)    Albumin 2.6 (*)    AST 220 (*)    ALT 121 (*)    All other components within normal limits  BRAIN NATRIURETIC PEPTIDE - Abnormal; Notable for the following components:   B Natriuretic Peptide 1,304.2 (*)    All other components within normal limits  CBC WITH DIFFERENTIAL/PLATELET - Abnormal; Notable for the following components:   RBC 3.10 (*)    Hemoglobin 9.0 (*)    HCT 29.0 (*)    Lymphs Abs 0.5 (*)    All other components within normal limits  SARS  CORONAVIRUS 2 BY RT PCR  PROCALCITONIN  TROPONIN I (HIGH SENSITIVITY)     EKG  I reviewed and interpreted patient's EKG which shows atrial fibrillation with RVR unifocal PVCs, no acute ischemic changes   RADIOLOGY Reviewed interpreted patient's chest x-ray which is negative for infiltrate or pulmonary edema   PROCEDURES:  Critical Care performed: Yes, see critical care procedure note(s)  .Critical Care  Performed by: Georga Hacking, MD Authorized by: Georga Hacking, MD   Critical care provider statement:    Critical care time (minutes):  30   Critical care  was time spent personally by me on the following activities:  Development of treatment plan with patient or surrogate, discussions with consultants, evaluation of patient's response to treatment, examination of patient, ordering and review of laboratory studies, ordering and review of radiographic studies, ordering and performing treatments and interventions, pulse oximetry, re-evaluation of patient's condition and review of old charts   The patient is on the cardiac monitor to evaluate for evidence of arrhythmia and/or significant heart rate changes.   MEDICATIONS ORDERED IN ED: Medications  methylPREDNISolone sodium succinate (SOLU-MEDROL) 125 mg/2 mL injection 60 mg (60 mg Intravenous Given 11/10/22 1010)  ipratropium-albuterol (DUONEB) 0.5-2.5 (3) MG/3ML nebulizer solution 3 mL (3 mLs Nebulization Given 11/10/22 1012)  furosemide (LASIX) injection 40 mg (40 mg Intravenous Given 11/10/22 1030)  metoprolol tartrate (LOPRESSOR) tablet 12.5 mg (12.5 mg Oral Given 11/10/22 1032)     IMPRESSION / MDM / ASSESSMENT AND PLAN / ED COURSE  I reviewed the triage vital signs and the nursing notes.                              Patient's presentation is most consistent with acute presentation with potential threat to life or bodily function.  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  He just left the hospital 3 days ago.  Was hospitalized for right femoral neck fracture for which she underwent operative repair.  His hospitalization was complicated by A-fib with RVR which was managed with p.o. metoprolol.  He also underwent ICD shock for episode of V. tach V-fib and was started on amiodarone drip now p.o.  He tells me he was doing okay until yesterday when he developed some mild dyspnea and today became more short of breath.  Tells me he has pain in his chest with coughing but otherwise is not having chest pain.  He has not been ambulating or out of bed since his surgery.  He is on Eliquis.  Has not had fevers or chills.  She is tachycardic EKG showing A-fib rates in the 1 teens.  He is tachypneic O2 sat is 99% on his baseline 2 L.  Patient looks chronically ill but nontoxic.  He has mild conversational dyspnea but is not in overt respiratory distress.  Does have audible wheezing and biphasic wheezing on auscultation.  Does not have any peripheral edema.   I suspect that with his wheezing and clear x-ray this is mainly COPD.  He has already received some DuoNeb's with EMS, will give Solu-Medrol and additional nebs.  With patient's low EF developed as not the best option but with him having exacerbation of COPD and also hesitant to use beta-blockers.  Given his heart rate is just in the low 110s  I think that this is unlikely causing a problem at this point and will observe.  Chest x-ray is read radiology as layering pleural effusions with increased pulmonary vascularity concerning for early CHF.  Will give a dose of IV  Lasix.  Patient has no leukocytosis.  Hemoglobin is stable.  Troponin x 1 is negative.  He does have a transaminitis.  AST is 220 ALT 120  with normal bili and alk phos.  Of note his LFTs were essentially normal prior to the onset of amiodarone and I question whether this transaminitis is in the setting of amiodarone.  Reviewed recommendations and per up-to-date there is recommendation to DC if there is a 2 fold elevation in LFTs.  Patient's heart rates have not been above 120.  Will hold off on any additional IV rate control agents at this time.  Given likely CHF and COPD exacerbation causing worsening dyspnea in the setting of ongoing tachycardia and now need to likely change his amiodarone I do feel that he should be brought in for hospitalization.  Will consult the hospitalist.     FINAL CLINICAL IMPRESSION(S) / ED DIAGNOSES   Final diagnoses:  Transaminitis  COPD exacerbation ([name])  Acute on chronic congestive heart failure, unspecified heart failure type ([name])     Rx / DC Orders   ED Discharge Orders     None        Note:  This document was prepared using Dragon voice recognition software and may include unintentional dictation errors.   Georga Hacking, MD 11/10/22 1016    Georga Hacking, MD 11/10/22 [phone]

## 2022-11-10 NOTE — Consult Note (Signed)
Pharmacy Antibiotic Note  Matthew Zamora is a 82 y.o. male admitted on 11/10/2022 with pneumonia.  Pharmacy has been consulted for vancomycin and cefepime dosing.  Plan: Start Cefepime 2 grams IV every 12 hours Give vancomycin 1500 mg IV x 1, then start 1 gram IV every 24 hours Estimated AUC 446, Cmin 10.8 Wt 69.4 kg, Scr 1.17, Vd coefficient 0.72 Vancomycin levels at steady state or as clinically indicated Follow renal function and culture results for adjustments     Temp (24hrs), Avg:98.3 F (36.8 C), Min:98.1 F (36.7 C), Max:98.5 F (36.9 C)  Recent Labs  Lab 11/04/22 0517 11/05/22 0821 11/05/22 0830 11/06/22 0632 11/07/22 0452 11/10/22 0920 11/10/22 1246  WBC 6.7  --  4.2 2.7* 7.5 6.1  --   CREATININE 1.51* 1.28*  --  1.30* 1.27* 1.14 1.17    Estimated Creatinine Clearance: 48.7 mL/min (by C-G formula based on SCr of 1.17 mg/dL).    Allergies  Allergen Reactions   Nitrofuran Derivatives Other (See Comments)    Transaminitis ** confounded w/Amiodarone, Mexiletine   Spironolactone     Significant transaminitis     Antimicrobials this admission: cefepime 5/26 >>  vancomycin 5/26 >>  Ceftriaxone x 1 5/26  Dose adjustments this admission: N/A  Microbiology results: 5/26 Sputum: ordered  5/26 MRSA PCR: detected  Thank you for allowing pharmacy to be a part of this patient's care.  Barrie Folk, PharmD 11/10/2022 3:31 PM

## 2022-11-10 NOTE — Consult Note (Signed)
CARDIOLOGY CONSULT NOTE               Patient ID: Matthew Zamora MRN: [id number] DOB/AGE: 17-Jun-1941 82 y.o.  Admit date: 11/10/2022 Referring Physician Dr. Llana Aliment spondylous Primary Physician Dr. 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 discharge home without any diuretics and now he presented with worsening shortness of breath congestive heart failure rapid ventricular response diagnosed with pneumonia on chest x-ray and being treated for both.  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 pylori infection    History of hepatitis    Hyperlipidemia    Hypertension    Liver cyst 05/16/15   PAF (paroxysmal atrial fibrillation) ([name])     Prostate CA ([name])     Past Surgical History:  Procedure Laterality Date   CATARACT EXTRACTION     COLONOSCOPY  10/06/2014, 09/18/2004, 03/14/2009   ESOPHAGOGASTRODUODENOSCOPY  10/06/2014, 03/18/2001, 03/14/2009   ESOPHAGOGASTRODUODENOSCOPY (EGD) WITH PROPOFOL N/A 05/07/2018   Procedure: ESOPHAGOGASTRODUODENOSCOPY (EGD) WITH PROPOFOL;  Surgeon: Toledo, Boykin Nearing, MD;  Location: [facility] ENDOSCOPY;  Service: Gastroenterology;  Laterality: N/A;   ESOPHAGOGASTRODUODENOSCOPY (EGD) WITH PROPOFOL N/A 04/22/2022   Procedure: ESOPHAGOGASTRODUODENOSCOPY (EGD) WITH PROPOFOL;  Surgeon: Toney Reil, MD;  Location: [facility] ENDOSCOPY;  Service: Endoscopy;  Laterality: N/A;   FLEXIBLE SIGMOIDOSCOPY  08/26/1990   HIP ARTHROPLASTY Right 10/31/2022   Procedure: ARTHROPLASTY BIPOLAR HIP (HEMIARTHROPLASTY)-RNFA;  Surgeon: Reinaldo Berber, MD;  Location: [facility] ORS;  Service: Orthopedics;  Laterality: Right;   INSERTION OF ICD     PROSTATE SURGERY     TONSILLECTOMY      Medications Prior to Admission  Medication Sig Dispense Refill Last Dose   amiodarone (PACERONE) 400 MG tablet Take 1 tablet (400 mg total) by mouth 2 (two) times daily for 10 days. Followed by 400mg  daily 20 tablet 0 11/09/2022 at 2245   ascorbic acid (VITAMIN C) 500 MG tablet Take 1 tablet by mouth daily.   11/09/2022 at 0947   atorvastatin (LIPITOR) 80 MG tablet Take 80 mg by mouth daily.   11/09/2022 at 1731   calcium carbonate (TUMS EX) 750 MG chewable tablet Chew 1 tablet by mouth daily.   11/09/2022 at 0947   ELIQUIS 2.5 MG TABS tablet Take 2.5 mg by mouth 2 (two) times daily.   11/09/2022 at 2245   fluticasone-salmeterol (ADVAIR) 500-50  MCG/ACT AEPB Inhale 1 puff into the lungs in the morning and at bedtime.   11/10/2022 at 1126   gabapentin (NEURONTIN) 100 MG capsule Take 100 mg by mouth at bedtime.   11/09/2022 at 2245   HYDROcodone-acetaminophen (NORCO/VICODIN) 5-325 MG tablet Take 1-2 tablets by mouth every 4 (four) hours as needed for moderate  pain (pain score 4-6). 30 tablet 0 11/10/2022 at 1223   magnesium oxide (MAG-OX) 400 (240 Mg) MG tablet Take 1 tablet by mouth daily.   11/09/2022 at 0947   melatonin 3 MG TABS tablet Take 9 mg by mouth at bedtime.   11/09/2022 at 2245   metoprolol tartrate (LOPRESSOR) 25 MG tablet Take 0.5 tablets (12.5 mg total) by mouth 2 (two) times daily.   11/09/2022 at 2245   midodrine (PROAMATINE) 5 MG tablet Take 3 tablets (15 mg total) by mouth 3 (three) times daily with meals.   11/09/2022 at 2245   Multiple Vitamin (MULTIVITAMIN WITH MINERALS) TABS tablet Take 1 tablet by mouth daily.   11/09/2022 at 0947   pantoprazole (PROTONIX) 40 MG tablet Take 1 tablet (40 mg total) by mouth 2 (two) times daily.   11/09/2022 at 1647   polyethylene glycol (MIRALAX / GLYCOLAX) 17 g packet Take 17 g by mouth 2 (two) times daily. 14 each 0 11/09/2022 at 1647   pyridostigmine (MESTINON) 60 MG tablet Take 1 tablet (60 mg total) by mouth every 8 (eight) hours.   11/10/2022 at 0637   sertraline (ZOLOFT) 25 MG tablet Take 25 mg by mouth daily.   11/09/2022 at 0947   sucralfate (CARAFATE) 1 GM/10ML suspension Take 10 mLs (1 g total) by mouth 4 (four) times daily -  with meals and at bedtime. 420 mL 0 11/09/2022 at 2245   tamsulosin (FLOMAX) 0.4 MG CAPS capsule Take 0.4 mg by mouth daily.   11/09/2022 at 0947   acetaminophen (TYLENOL) 325 MG tablet Take 2 tablets (650 mg total) by mouth every 4 (four) hours as needed for headache or mild pain.   11/08/2022 at 1401   albuterol (PROVENTIL) (2.5 MG/3ML) 0.083% nebulizer solution Inhale 3 mLs (2.5 mg total) into the lungs as needed for shortness of breath. 75 mL 12 PRN at PRN   albuterol (VENTOLIN HFA) 108 (90 Base) MCG/ACT inhaler Inhale 2 puffs into the lungs every 4 (four) hours as needed.   11/08/2022 at 1401   [START ON 11/17/2022] amiodarone (PACERONE) 400 MG tablet Take 1 tablet (400 mg total) by mouth daily. To be started after completing 10 days of the twice daily dosing 30 tablet 0     metoCLOPramide (REGLAN) 5 MG tablet Take 1 tablet (5 mg total) by mouth every 8 (eight) hours as needed for refractory nausea / vomiting.   PRN at PRN   Nebulizer MISC 1 each by Does not apply route as needed. 1 each 0    Nutritional Supplements (,FEEDING SUPPLEMENT, PROSOURCE PLUS) liquid Take 30 mLs by mouth 3 (three) times daily between meals.   11/08/2022 at 1125   ondansetron (ZOFRAN-ODT) 4 MG disintegrating tablet Take 4 mg by mouth every 8 (eight) hours as needed for refractory nausea / vomiting, vomiting or nausea.   PRN at PRN   Social History   Socioeconomic History   Marital status: Single    Spouse name: Not on file   Number of children: Not on file   Years of education: Not on file   Highest education level: Not on file  Occupational History  Not on file  Tobacco Use   Smoking status: Former    Packs/day: 1.00    Years: 38.00    Additional pack years: 0.00    Total pack years: 38.00    Types: Cigarettes    Quit date: 06/18/1999    Years since quitting: 23.4   Smokeless tobacco: Never  Vaping Use   Vaping Use: Never used  Substance and Sexual Activity   Alcohol use: No    Alcohol/week: 0.0 standard drinks of alcohol   Drug use: Not Currently   Sexual activity: Not Currently  Other Topics Concern   Not on file  Social History Narrative   Not on file   Social Determinants of Health   Financial Resource Strain: Not on file  Food Insecurity: No Food Insecurity (11/10/2022)   Hunger Vital Sign    Worried About Running Out of Food in the Last Year: Never true    Ran Out of Food in the Last Year: Never true  Transportation Needs: No Transportation Needs (11/10/2022)   PRAPARE - Administrator, Civil Service (Medical): No    Lack of Transportation (Non-Medical): No  Physical Activity: Not on file  Stress: Not on file  Social Connections: Not on file  Intimate Partner Violence: Not At Risk (11/10/2022)   Humiliation, Afraid, Rape, and Kick questionnaire     Fear of Current or Ex-Partner: No    Emotionally Abused: No    Physically Abused: No    Sexually Abused: No    Family History  Problem Relation Age of Onset   Heart attack Mother    Heart attack Father       Review of systems complete and found to be negative unless listed above      PHYSICAL EXAM  General: Well developed, well nourished, in no acute distress HEENT:  Normocephalic and atramatic Neck:  No JVD.  Lungs: Clear bilaterally to auscultation and percussion. Heart: Irregular irregular. Normal S1 and S2 without gallops or murmurs.  Abdomen: Bowel sounds are positive, abdomen soft and non-tender  Msk:  Back normal, normal gait. Normal strength and tone for age. Extremities: No clubbing, cyanosis or edema.   Neuro: Alert and oriented X 3. Psych:  Good affect, responds appropriately  Labs:   Lab Results  Component Value Date   WBC 6.1 11/10/2022   HGB 9.0 (L) 11/10/2022   HCT 29.0 (L) 11/10/2022   MCV 93.5 11/10/2022   PLT 193 11/10/2022    Recent Labs  Lab 11/10/22 0920 11/10/22 1246  NA 136 137  K 3.5 3.6  CL 103 101  CO2 28 29  BUN 18 17  CREATININE 1.14 1.17  CALCIUM 8.7* 8.9  PROT 5.4*  --   BILITOT 1.0  --   ALKPHOS 69  --   ALT 121*  --   AST 220*  --   GLUCOSE 110* 123*   Lab Results  Component Value Date   CKTOTAL 70 03/27/2022   TROPONINI <0.03 09/17/2018    Lab Results  Component Value Date   CHOL 125 06/14/2021   CHOL 139 10/26/2019   Lab Results  Component Value Date   HDL 45 06/14/2021   HDL 34 (L) 10/26/2019   Lab Results  Component Value Date   LDLCALC 71 06/14/2021   LDLCALC 88 10/26/2019   Lab Results  Component Value Date   TRIG 45 06/14/2021   TRIG 87 10/26/2019   TRIG 130 04/30/2018   Lab Results  Component  Value Date   CHOLHDL 2.8 06/14/2021   CHOLHDL 4.1 10/26/2019   No results found for: "LDLDIRECT"    Radiology: ECHOCARDIOGRAM COMPLETE  Result Date: 11/10/2022    ECHOCARDIOGRAM REPORT   Patient  Name:   Matthew Zamora Date of Exam: 11/10/2022 Medical Rec #:  [id number]           Height:       68.0 in Accession #:    [id number]          Weight:       153.0 lb Date of Birth:  January 22, 1941           BSA:          1.824 m Patient Age:    81 years            BP:           126/82 mmHg Patient Gender: M                   HR:           121 bpm. Exam Location:  [facility] Procedure: 2D Echo Indications:     CHF I50.21  History:         Patient has prior history of Echocardiogram examinations, most                  recent 04/21/2022.  Sonographer:     Overton Mam RDCS, FASE Referring Phys:  [id number] Beulah Gandy A THOMAS Diagnosing Phys: Julien Nordmann MD  Sonographer Comments: Technically difficult study due to poor echo windows and suboptimal parasternal window. Image acquisition challenging due to respiratory motion. IMPRESSIONS  1. Left ventricular ejection fraction, by estimation, is 30 to 35%. Left ventricular ejection fraction by PLAX is 34 %. The left ventricle has moderately decreased function. The left ventricle demonstrates global hypokinesis. The left ventricular internal cavity size was mildly dilated. Left ventricular diastolic parameters are indeterminate.  2. Right ventricular systolic function is normal. The right ventricular size is normal. There is moderately elevated pulmonary artery systolic pressure. The estimated right ventricular systolic pressure is 51.2 mmHg.  3. The mitral valve is normal in structure. Mild mitral valve regurgitation. No evidence of mitral stenosis.  4. The aortic valve is tricuspid. Aortic valve regurgitation is not visualized. Aortic valve sclerosis is present, with no evidence of aortic valve stenosis.  5. The inferior vena cava is normal in size with <50% respiratory variability, suggesting right atrial pressure of 8 mmHg.  6. Rhythm is atrial fibrillation rate 102 to 130 bpm FINDINGS  Left Ventricle: Left ventricular ejection fraction, by estimation, is 30 to 35%. Left  ventricular ejection fraction by PLAX is 34 %. The left ventricle has moderately decreased function. The left ventricle demonstrates global hypokinesis. The left ventricular internal cavity size was mildly dilated. There is no left ventricular hypertrophy. Left ventricular diastolic parameters are indeterminate. Right Ventricle: The right ventricular size is normal. No increase in right ventricular wall thickness. Right ventricular systolic function is normal. There is moderately elevated pulmonary artery systolic pressure. The tricuspid regurgitant velocity is 3.40 m/s, and with an assumed right atrial pressure of 5 mmHg, the estimated right ventricular systolic pressure is 51.2 mmHg. Left Atrium: Left atrial size was normal in size. Right Atrium: Right atrial size was normal in size. Pericardium: There is no evidence of pericardial effusion. Mitral Valve: The mitral valve is normal in structure. Mild mitral valve regurgitation. No evidence of mitral valve stenosis. Tricuspid Valve: The  tricuspid valve is normal in structure. Tricuspid valve regurgitation is mild . No evidence of tricuspid stenosis. Aortic Valve: The aortic valve is tricuspid. Aortic valve regurgitation is not visualized. Aortic valve sclerosis is present, with no evidence of aortic valve stenosis. Aortic valve peak gradient measures 10.8 mmHg. Pulmonic Valve: The pulmonic valve was normal in structure. Pulmonic valve regurgitation is not visualized. No evidence of pulmonic stenosis. Aorta: The aortic root is normal in size and structure. Venous: The inferior vena cava is normal in size with less than 50% respiratory variability, suggesting right atrial pressure of 8 mmHg. IAS/Shunts: No atrial level shunt detected by color flow Doppler. Additional Comments: A device lead is visualized.  LEFT VENTRICLE PLAX 2D LV EF:         Left            Diastology                ventricular     LV e' medial:    10.30 cm/s                ejection        LV E/e'  medial:  11.2                fraction by     LV e' lateral:   16.20 cm/s                PLAX is 34      LV E/e' lateral: 7.1                %. LVIDd:         5.00 cm LVIDs:         4.20 cm LV PW:         1.10 cm LV IVS:        1.20 cm LVOT diam:     1.80 cm LV SV:         36 LV SV Index:   20 LVOT Area:     2.54 cm  LV Volumes (MOD) LV vol d, MOD    102.0 ml A4C: LV vol s, MOD    56.2 ml A4C: LV SV MOD A4C:   102.0 ml RIGHT VENTRICLE RV Basal diam:  2.70 cm RV S prime:     13.50 cm/s TAPSE (M-mode): 1.5 cm LEFT ATRIUM           Index        RIGHT ATRIUM           Index LA diam:      4.20 cm 2.30 cm/m   RA Area:     17.10 cm LA Vol (A2C): 44.0 ml 24.13 ml/m  RA Volume:   46.10 ml  25.28 ml/m LA Vol (A4C): 55.6 ml 30.49 ml/m  AORTIC VALVE                 PULMONIC VALVE AV Area (Vmax): 1.51 cm     RVOT Peak grad: 3 mmHg AV Vmax:        164.00 cm/s AV Peak Grad:   10.8 mmHg LVOT Vmax:      97.40 cm/s LVOT Vmean:     61.300 cm/s LVOT VTI:       0.141 m  AORTA Ao Root diam: 3.50 cm MITRAL VALVE                TRICUSPID VALVE MV Area (PHT): 3.21 cm     TR Peak  grad:   46.2 mmHg MV Decel Time: 236 msec     TR Vmax:        340.00 cm/s MV E velocity: 115.00 cm/s                             SHUNTS                             Systemic VTI:  0.14 m                             Systemic Diam: 1.80 cm Julien Nordmann MD Electronically signed by Julien Nordmann MD Signature Date/Time: 11/10/2022/6:47:31 PM    Final    CT CHEST WO CONTRAST  Result Date: 11/10/2022 CLINICAL DATA:  Chest pain and shortness of breath. EXAM: CT CHEST WITHOUT CONTRAST TECHNIQUE: Multidetector CT imaging of the chest was performed following the standard protocol without IV contrast. RADIATION DOSE REDUCTION: This exam was performed according to the departmental dose-optimization program which includes automated exposure control, adjustment of the mA and/or kV according to patient size and/or use of iterative reconstruction technique. COMPARISON:   Chest x-ray from same day. CT chest dated October 15, 2022. FINDINGS: Cardiovascular: Unchanged left chest wall pacemaker. Unchanged mild cardiomegaly. No pericardial effusion. No thoracic aortic aneurysm. Coronary, aortic arch, and branch vessel atherosclerotic vascular disease. Mediastinum/Nodes: Enlarged right paratracheal lymph node currently measuring 1.5 cm in short axis, previously 1.1 cm. Additional subcentimeter mediastinal and right hilar lymph nodes are unchanged. No enlarged axillary lymph nodes. The thyroid gland, trachea, and esophagus demonstrate no significant findings. Lungs/Pleura: Small bilateral pleural effusions. Scattered mild smooth interlobular septal thickening. New scattered ill-defined ground-glass densities and denser nodular opacities in both upper lobes, the right middle lobe, and the anterior left lower lobe. Few new small nodules in the superior segment of the right lower lobe. Subsegmental atelectasis in both posterior lower lobes. No pneumothorax. Unchanged 6 mm nodule left lower lobe (series 3, image 83). Unchanged 5 mm nodule in the right lower lobe (series 3, image 89). The previously seen ground-glass nodule in the right upper lobe is no longer identified. Mild centrilobular emphysema. Upper Abdomen: No acute abnormality. Unchanged tiny gallstone. Partially visualized complex cyst in the inferior right liver, as seen on prior studies. Musculoskeletal: No acute or significant osseous findings. IMPRESSION: 1. New scattered ill-defined ground-glass densities and denser nodular opacities in both upper lobes, the right middle lobe, and the anterior left lower lobe, concerning for multifocal pneumonia. 2. Small bilateral pleural effusions with mild interstitial pulmonary edema. 3. Enlarged right paratracheal lymph node currently measuring 1.5 cm in short axis, previously 1.1 cm, likely reactive. 4. Previously seen ground-glass nodule in the right upper lobe is no longer identified.  Other previously identified small pulmonary nodules in both lower lobes are unchanged. 5. Aortic Atherosclerosis (ICD10-I70.0) and Emphysema (ICD10-J43.9). Electronically Signed   By: Obie Dredge M.D.   On: 11/10/2022 14:00   DG Chest 2 View  Result Date: 11/10/2022 CLINICAL DATA:  Shortness of breath. EXAM: CHEST - 2 VIEW COMPARISON:  10/30/22 FINDINGS: Left chest wall ICD noted with leads in the right atrial appendage and right ventricle. Heart size is normal. Aortic atherosclerosis. Small bilateral posterior layering pleural effusions are identified on the lateral projection radiograph. Increased pulmonary vascularity. No frank edema. No airspace consolidation. IMPRESSION: 1. Small bilateral  pleural effusions and increased pulmonary vascularity concerning for early CHF. Electronically Signed   By: Signa Kell M.D.   On: 11/10/2022 10:04   DG Chest Port 1 View  Result Date: 11/04/2022 CLINICAL DATA:  Cough EXAM: PORTABLE CHEST 1 VIEW COMPARISON:  X-ray 10/30/2022 FINDINGS: Underinflation compared to the prior x-ray. Stable cardiopericardial silhouette with calcified aorta. Left upper chest defibrillator with leads overlying the right side of the heart. Vascular congestion. No pneumothorax or effusion. Question trace edema. No separate consolidation IMPRESSION: Vascular congestion with question trace edema. Defibrillator. Decreased inflation compared to prior x-ray.  Recommend follow-up Electronically Signed   By: Karen Kays M.D.   On: 11/04/2022 10:38   DG Pelvis Portable  Result Date: 10/31/2022 CLINICAL DATA:  Bipolar right hip hemiarthroplasty. EXAM: PORTABLE PELVIS 1-2 VIEWS COMPARISON:  CT right hip 11/02/2022 FINDINGS: Single frontal view of the bilateral hips. Interval bipolar right hip hemiarthroplasty with normal alignment. Expected postoperative subcutaneous air about the right hip. Mild superomedial left femoroacetabular joint space narrowing. Surgical clips overlie the pelvis. No  acute fracture or dislocation. IMPRESSION: Interval bipolar right hip hemiarthroplasty with normal alignment. Electronically Signed   By: Neita Garnet M.D.   On: 10/31/2022 16:23   CT Hip Right Wo Contrast  Result Date: 10/30/2022 CLINICAL DATA:  Right femoral fracture EXAM: CT OF THE RIGHT HIP WITHOUT CONTRAST TECHNIQUE: Multidetector CT imaging of the right hip was performed according to the standard protocol. Multiplanar CT image reconstructions were also generated. RADIATION DOSE REDUCTION: This exam was performed according to the departmental dose-optimization program which includes automated exposure control, adjustment of the mA and/or kV according to patient size and/or use of iterative reconstruction technique. COMPARISON:  Right hips and pelvis radiograph dated 10/30/2022 FINDINGS: Bones/Joint/Cartilage Acute transcervical fracture of the right proximal femur with apex anterior angulation. The right femoroacetabular joint is intact. Ligaments Suboptimally assessed by CT. Muscles and Tendons Grossly intact. Soft tissues Vascular calcifications of the partially imaged right lower extremity arteries. IMPRESSION: Mildly angulated right femoral transcervical fracture. Electronically Signed   By: Agustin Cree M.D.   On: 10/30/2022 20:49   DG Knee 2 Views Right  Result Date: 10/30/2022 CLINICAL DATA:  Right knee pain, fall EXAM: RIGHT KNEE - 1-2 VIEW COMPARISON:  None Available. FINDINGS: Moderate tricompartment degenerative changes with joint space narrowing and spurring. No joint effusion. No acute bony abnormality. Specifically, no fracture, subluxation, or dislocation. IMPRESSION: Moderate degenerative changes.  No acute bony abnormality. Electronically Signed   By: Charlett Nose M.D.   On: 10/30/2022 20:25   DG Hip Unilat W or Wo Pelvis 2-3 Views Right  Result Date: 10/30/2022 CLINICAL DATA:  Fall, right hip pain EXAM: DG HIP (WITH OR WITHOUT PELVIS) 2-3V RIGHT COMPARISON:  None Available. FINDINGS:  There is foreshortening of the right femoral neck with cortical irregularity noted laterally concerning for right femoral neck fracture. No subluxation or dislocation. Mild symmetric degenerative changes in the hips with joint space narrowing and spurring. IMPRESSION: Foreshortening of the right femoral neck with cortical irregularity laterally concerning for femoral neck fracture. Electronically Signed   By: Charlett Nose M.D.   On: 10/30/2022 20:24   DG Chest Portable 1 View  Result Date: 10/30/2022 CLINICAL DATA:  Fall, right hip pain EXAM: PORTABLE CHEST 1 VIEW COMPARISON:  09/14/2022 FINDINGS: Left AICD remains in place, unchanged. Heart is normal size. Mediastinal contours within normal limits. No confluent opacities or effusions. No acute bony abnormality. IMPRESSION: No active disease. Electronically Signed  By: Charlett Nose M.D.   On: 10/30/2022 20:22   CT HEAD WO CONTRAST ( )  Result Date: 10/30/2022 CLINICAL DATA:  Trip and fall injury. EXAM: CT HEAD WITHOUT CONTRAST CT CERVICAL SPINE WITHOUT CONTRAST TECHNIQUE: Multidetector CT imaging of the head and cervical spine was performed following the standard protocol without intravenous contrast. Multiplanar CT image reconstructions of the cervical spine were also generated. RADIATION DOSE REDUCTION: This exam was performed according to the departmental dose-optimization program which includes automated exposure control, adjustment of the mA and/or kV according to patient size and/or use of iterative reconstruction technique. COMPARISON:  Head CT without contrast 04/17/2022, cervical spine CT 03/27/2022. FINDINGS: CT HEAD FINDINGS Brain: There is mild atrophy, small-vessel disease and atrophic ventriculomegaly. No new asymmetry is seen concerning for an acute infarct, hemorrhage or mass. There is no midline shift. The basal cisterns are clear. Vascular: There are patchy calcifications of the carotid siphons but no hyperdense central vessels. Skull:  No fracture or focal lesions.  Mild calvarial osteopenia. Sinuses/Orbits: No acute findings. Clear visualized sinuses and mastoid air cells. Negative orbits apart from old lens replacements. Other: None. CT CERVICAL SPINE FINDINGS Alignment: Unchanged. There is a trace 2 mm C3-4 retrolisthesis, similar minimal C4-5 anterolisthesis and trace C5-6 and C6-7 retrolisthesis all believed degenerative and all unchanged. No traumatic listhesis is seen. There is bone-on-bone anterior atlantodental joint space loss with osteophytes, and degenerative cystic changes in the odontoid process. Skull base and vertebrae: No acute fracture is evident. Bone mineralization is osteopenic. No primary bone lesion or focal pathologic process is seen. Soft tissues and spinal canal: No prevertebral fluid or swelling. No visible canal hematoma. There are calcifications in both proximal cervical ICAs, heaviest on the left where there is probably a flow-limiting origin stenosis. No laryngeal or thyroid mass. Disc levels: There is moderate disc space loss again at C3-4, C5-6 and C6-7, mild disc narrowing at C3-4, normal disc heights at C2-3 and C7-T1. There are small bidirectional endplate spurs from C3-4 through C6-7, causing partial effacement of the ventral CSF without spondylotic cord compression. Facet joint spurring on the left-greater-than-right is seen at most levels with bilateral uncinate spurring. Acquired foraminal stenosis is mild on the right at C2-3, severe on the left and moderate on the right at C3-4, bilaterally mild-to-moderate C6-7, and not seen at the remaining levels. Upper chest: Pacemaker wiring is partially visible entering from the left. Otherwise negative. Other: None. IMPRESSION: 1. No acute intracranial CT findings or depressed skull fractures. 2. Osteopenia and degenerative change without evidence of cervical fractures or traumatic listhesis. 3. 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 Follow-up as indicated. Electronically Signed   By: Almira Bar M.D.   On: 10/30/2022 20:15   CT Cervical Spine Wo Contrast  Result Date: 10/30/2022 CLINICAL DATA:  Trip and fall injury. EXAM: CT HEAD WITHOUT CONTRAST CT CERVICAL SPINE WITHOUT CONTRAST TECHNIQUE: Multidetector CT imaging of the head and cervical spine was performed following the standard protocol without intravenous contrast. Multiplanar CT image reconstructions of the cervical spine were also generated. RADIATION DOSE REDUCTION: This exam was performed according to the departmental dose-optimization program which includes automated exposure control, adjustment of the mA and/or kV according to patient size and/or use of iterative reconstruction technique. COMPARISON:  Head CT without contrast 04/17/2022, cervical spine CT 03/27/2022. FINDINGS: CT HEAD FINDINGS Brain: There is mild atrophy, small-vessel disease and atrophic ventriculomegaly. No new asymmetry is seen concerning for an acute infarct,  hemorrhage or mass. There is no midline shift. The basal cisterns are clear. Vascular: There are patchy calcifications of the carotid siphons but no hyperdense central vessels. Skull: No fracture or focal lesions.  Mild calvarial osteopenia. Sinuses/Orbits: No acute findings. Clear visualized sinuses and mastoid air cells. Negative orbits apart from old lens replacements. Other: None. CT CERVICAL SPINE FINDINGS Alignment: Unchanged. There is a trace 2 mm C3-4 retrolisthesis, similar minimal C4-5 anterolisthesis and trace C5-6 and C6-7 retrolisthesis all believed degenerative and all unchanged. No traumatic listhesis is seen. There is bone-on-bone anterior atlantodental joint space loss with osteophytes, and degenerative cystic changes in the odontoid process. Skull base and vertebrae: No acute fracture is evident. Bone mineralization is osteopenic. No primary bone lesion or focal pathologic process is seen. Soft tissues and  spinal canal: No prevertebral fluid or swelling. No visible canal hematoma. There are calcifications in both proximal cervical ICAs, heaviest on the left where there is probably a flow-limiting origin stenosis. No laryngeal or thyroid mass. Disc levels: There is moderate disc space loss again at C3-4, C5-6 and C6-7, mild disc narrowing at C3-4, normal disc heights at C2-3 and C7-T1. There are small bidirectional endplate spurs from C3-4 through C6-7, causing partial effacement of the ventral CSF without spondylotic cord compression. Facet joint spurring on the left-greater-than-right is seen at most levels with bilateral uncinate spurring. Acquired foraminal stenosis is mild on the right at C2-3, severe on the left and moderate on the right at C3-4, bilaterally mild-to-moderate C6-7, and not seen at the remaining levels. Upper chest: Pacemaker wiring is partially visible entering from the left. Otherwise negative. Other: None. IMPRESSION: 1. No acute intracranial CT findings or depressed skull fractures. 2. Osteopenia and degenerative change without evidence of cervical fractures or traumatic listhesis. 3. 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 Follow-up as indicated. Electronically Signed   By: Almira Bar M.D.   On: 10/30/2022 20:15   CT Angio Chest PE W/Cm &/Or Wo Cm  Result Date: 10/15/2022 CLINICAL DATA:  Chest pain. EXAM: CT ANGIOGRAPHY CHEST WITH CONTRAST TECHNIQUE: Multidetector CT imaging of the chest was performed using the standard protocol during bolus administration of intravenous contrast. Multiplanar CT image reconstructions and MIPs were obtained to evaluate the vascular anatomy. RADIATION DOSE REDUCTION: This exam was performed according to the departmental dose-optimization program which includes automated exposure control, adjustment of the mA and/or kV according to patient size and/or use of iterative reconstruction technique.  CONTRAST:  60mL OMNIPAQUE IOHEXOL 350 MG/ML SOLN COMPARISON:  May 11, 2022. FINDINGS: Cardiovascular: Satisfactory opacification of the pulmonary arteries to the segmental level. No evidence of pulmonary embolism. Normal heart size. No pericardial effusion. Coronary artery calcifications are noted. Mediastinum/Nodes: No enlarged mediastinal, hilar, or axillary lymph nodes. Thyroid gland, trachea, and esophagus demonstrate no significant findings. Lungs/Pleura: No pneumothorax or pleural effusion is noted. Stable 6 mm nodule is noted in left lower lobe best seen on image number 83 of series 5. 1 cm part solid nodule is noted in right upper lobe best seen on image number 58 of series 5, with 4 mm solid nodule. Minimal right posterior basilar subsegmental atelectasis or scarring is noted. Stable 5 mm nodule is noted in right lower lobe best seen on image number 90 of series 5. Upper Abdomen: No acute abnormality. Musculoskeletal: No chest wall abnormality. No acute or significant osseous findings. Review of the MIP images confirms the above findings. IMPRESSION: No definite evidence of pulmonary embolus. Interval  development of 1 cm part solid nodule in right upper lobe. Follow-up non-contrast CT recommended at 3-6 months to confirm persistence. If unchanged, and solid component remains <6 mm, annual CT is recommended until 5 years of stability has been established. If persistent these nodules should be considered highly suspicious if the solid component of the nodule is 6 mm or greater in size and enlarging. This recommendation follows the consensus statement: Guidelines for Management of Incidental Pulmonary Nodules Detected on CT Images: From the Fleischner Society 2017; Radiology 2017; 284:228-243. Stable bilateral lower lobe nodules are noted, the largest measuring 6 mm. Attention to these on follow-up imaging is recommended. Coronary artery calcifications are noted. Aortic Atherosclerosis (ICD10-I70.0) and  Emphysema (ICD10-J43.9). Electronically Signed   By: Lupita Raider M.D.   On: 10/15/2022 09:29    EKG: Atrial fibrillation rapid ventricular response rate of 140 bundle branch block  ASSESSMENT AND PLAN:  Hospital-acquired pneumonia Shortness of breath dyspnea Chronic ischemic cardiomyopathy Congestive heart failure systolic dysfunction COPD hypoxemia on 2 L O2 Hypertension Intermittent hypotension Hyperlipidemia History of myocardial infarction PCI and stent DES AICD in place Elevated LFTs Atrial fibrillation RVR . Plan Agree with admission to telemetry follow-up EKGs troponins EKGs Discontinue amiodarone with his elevated LFTs and switch to rate control with metoprolol Continue IV diuretics with Lasix therapy Agree with IV antibiotic therapy for hospital-acquired pneumonia Supplemental oxygen for hypoxemia shortness of breath Agree with continued heart failure therapy for shortness of breath dyspnea Follow-up LFTs hopefully will improve could be multifactorial including his pneumonia passive congestion Rate control for atrial fibrillation continue Eliquis for anticoagulation   Signed: Alwyn Pea MD 11/10/2022, 10:35 PM

## 2022-11-11 DIAGNOSIS — I5043 Acute on chronic combined systolic (congestive) and diastolic (congestive) heart failure: Secondary | ICD-10-CM

## 2022-11-11 DIAGNOSIS — R7401 Elevation of levels of liver transaminase levels: Secondary | ICD-10-CM | POA: Diagnosis not present

## 2022-11-11 DIAGNOSIS — J441 Chronic obstructive pulmonary disease with (acute) exacerbation: Secondary | ICD-10-CM | POA: Diagnosis not present

## 2022-11-11 DIAGNOSIS — R079 Chest pain, unspecified: Secondary | ICD-10-CM

## 2022-11-11 LAB — TROPONIN I (HIGH SENSITIVITY)
Troponin I (High Sensitivity): 21 ng/L — ABNORMAL HIGH (ref <18)
Troponin I (High Sensitivity): 23 ng/L — ABNORMAL HIGH (ref <18)

## 2022-11-11 LAB — COMPREHENSIVE METABOLIC PANEL
ALT: 222 U/L — ABNORMAL HIGH (ref 0–44)
AST: 391 U/L — ABNORMAL HIGH (ref 15–41)
Albumin: 2.8 g/dL — ABNORMAL LOW (ref 3.5–5.0)
Alkaline Phosphatase: 73 U/L (ref 38–126)
Anion gap: 8 (ref 5–15)
BUN: 26 mg/dL — ABNORMAL HIGH (ref 8–23)
CO2: 28 mmol/L (ref 22–32)
Calcium: 8.5 mg/dL — ABNORMAL LOW (ref 8.9–10.3)
Chloride: 98 mmol/L (ref 98–111)
Creatinine, Ser: 1.4 mg/dL — ABNORMAL HIGH (ref 0.61–1.24)
GFR, Estimated: 51 mL/min — ABNORMAL LOW (ref >60)
Glucose, Bld: 157 mg/dL — ABNORMAL HIGH (ref 70–99)
Potassium: 4.2 mmol/L (ref 3.5–5.1)
Sodium: 134 mmol/L — ABNORMAL LOW (ref 135–145)
Total Bilirubin: 0.9 mg/dL (ref 0.3–1.2)
Total Protein: 5.8 g/dL — ABNORMAL LOW (ref 6.5–8.1)

## 2022-11-11 LAB — CBC
HCT: 28.1 % — ABNORMAL LOW (ref 39.0–52.0)
Hemoglobin: 9.1 g/dL — ABNORMAL LOW (ref 13.0–17.0)
MCH: 29.2 pg (ref 26.0–34.0)
MCHC: 32.4 g/dL (ref 30.0–36.0)
MCV: 90.1 fL (ref 80.0–100.0)
Platelets: 224 10*3/uL (ref 150–400)
RBC: 3.12 MIL/uL — ABNORMAL LOW (ref 4.22–5.81)
RDW: 14.1 % (ref 11.5–15.5)
WBC: 9.4 10*3/uL (ref 4.0–10.5)
nRBC: 0 % (ref 0.0–0.2)

## 2022-11-11 LAB — LACTIC ACID, PLASMA
Lactic Acid, Venous: 2 mmol/L (ref 0.5–1.9)
Lactic Acid, Venous: 2.6 mmol/L (ref 0.5–1.9)

## 2022-11-11 LAB — BRAIN NATRIURETIC PEPTIDE: B Natriuretic Peptide: 1044.6 pg/mL — ABNORMAL HIGH (ref 0.0–100.0)

## 2022-11-11 MED ORDER — METOPROLOL TARTRATE 25 MG PO TABS
25.0000 mg | ORAL_TABLET | Freq: Two times a day (BID) | ORAL | Status: DC
  Administered 2022-11-11: 25 mg via ORAL
  Filled 2022-11-11: qty 1

## 2022-11-11 MED ORDER — METOPROLOL TARTRATE 5 MG/5ML IV SOLN
2.5000 mg | Freq: Once | INTRAVENOUS | Status: AC | PRN
  Administered 2022-11-11: 2.5 mg via INTRAVENOUS
  Filled 2022-11-11: qty 5

## 2022-11-11 MED ORDER — METOPROLOL TARTRATE 25 MG PO TABS
12.5000 mg | ORAL_TABLET | Freq: Once | ORAL | Status: AC
  Administered 2022-11-11: 12.5 mg via ORAL
  Filled 2022-11-11: qty 1

## 2022-11-11 MED ORDER — IPRATROPIUM-ALBUTEROL 0.5-2.5 (3) MG/3ML IN SOLN
3.0000 mL | Freq: Two times a day (BID) | RESPIRATORY_TRACT | Status: DC
  Administered 2022-11-11: 3 mL via RESPIRATORY_TRACT
  Filled 2022-11-11: qty 3

## 2022-11-11 MED ORDER — MORPHINE SULFATE (PF) 2 MG/ML IV SOLN
1.0000 mg | Freq: Once | INTRAVENOUS | Status: AC
  Administered 2022-11-11: 1 mg via INTRAVENOUS
  Filled 2022-11-11: qty 1

## 2022-11-11 MED ORDER — GUAIFENESIN ER 600 MG PO TB12
600.0000 mg | ORAL_TABLET | Freq: Two times a day (BID) | ORAL | Status: DC
  Administered 2022-11-11 – 2022-11-18 (×14): 600 mg via ORAL
  Filled 2022-11-11 (×14): qty 1

## 2022-11-11 MED ORDER — GUAIFENESIN ER 600 MG PO TB12: 600.0000 mg | ORAL_TABLET | Freq: Two times a day (BID) | ORAL | Status: DC

## 2022-11-11 MED ORDER — VANCOMYCIN HCL 750 MG/150ML IV SOLN
750.0000 mg | INTRAVENOUS | Status: DC
  Filled 2022-11-11: qty 150

## 2022-11-11 MED ORDER — METOPROLOL TARTRATE 5 MG/5ML IV SOLN
2.5000 mg | Freq: Four times a day (QID) | INTRAVENOUS | Status: DC | PRN
  Administered 2022-11-11 – 2022-11-12 (×3): 2.5 mg via INTRAVENOUS
  Filled 2022-11-11 (×4): qty 5

## 2022-11-11 MED ORDER — METOPROLOL TARTRATE 25 MG PO TABS
12.5000 mg | ORAL_TABLET | Freq: Two times a day (BID) | ORAL | Status: DC
  Administered 2022-11-11: 12.5 mg via ORAL
  Filled 2022-11-11: qty 1

## 2022-11-11 MED ORDER — METOPROLOL TARTRATE 5 MG/5ML IV SOLN: 2.5000 mg | Freq: Four times a day (QID) | INTRAVENOUS | Status: DC | PRN

## 2022-11-11 MED ORDER — SODIUM CHLORIDE 0.9 % IV SOLN
500.0000 mg | INTRAVENOUS | Status: DC
  Administered 2022-11-11: 500 mg via INTRAVENOUS
  Filled 2022-11-11: qty 5

## 2022-11-11 MED ORDER — IPRATROPIUM BROMIDE 0.02 % IN SOLN
0.5000 mg | Freq: Four times a day (QID) | RESPIRATORY_TRACT | Status: DC
  Administered 2022-11-11 – 2022-11-12 (×4): 0.5 mg via RESPIRATORY_TRACT
  Filled 2022-11-11 (×4): qty 2.5

## 2022-11-11 MED ORDER — APIXABAN 2.5 MG PO TABS
2.5000 mg | ORAL_TABLET | Freq: Two times a day (BID) | ORAL | Status: DC
  Administered 2022-11-11 (×2): 2.5 mg via ORAL
  Filled 2022-11-11 (×2): qty 1

## 2022-11-11 NOTE — Consult Note (Signed)
Pharmacy Antibiotic Note  Matthew Zamora is a 82 y.o. male admitted on 11/10/2022 with pneumonia.  Pharmacy has been consulted for vancomycin and cefepime dosing.  -recent hospitalization 10/30/22-11/07/22 for hip fx  Plan: Scr increase 1.17>>1.40 Continue Cefepime 2 grams IV Q12hr for Crcl 38.7 ml/min Adjust Vancomycin to 750 mg IV q24h Estimated AUC 438, Cmin 11.9 Wt 65 kg, Scr 1.40  TBW<IBW, Vd coefficient 0.72 Vancomycin levels at steady state or as clinically indicated Follow renal function and culture results for adjustments  Height: 5\' 8"  (172.7 cm) Weight: 65 kg (143 lb 4.8 oz) IBW/kg (Calculated) : 68.4  Temp (24hrs), Avg:98.3 F (36.8 C), Min:98.1 F (36.7 C), Max:98.6 F (37 C)  Recent Labs  Lab 11/05/22 0830 11/06/22 0632 11/07/22 0452 11/10/22 0920 11/10/22 1246 11/10/22 1534 11/10/22 2050 11/10/22 2342 11/11/22 0539  WBC 4.2 2.7* 7.5 6.1  --   --   --   --  9.4  CREATININE  --  1.30* 1.27* 1.14 1.17  --   --   --  1.40*  LATICACIDVEN  --   --   --   --   --  1.4 2.5* 2.6* 2.0*     Estimated Creatinine Clearance: 38.7 mL/min (A) (by C-G formula based on SCr of 1.4 mg/dL (H)).    Allergies  Allergen Reactions   Nitrofuran Derivatives Other (See Comments)    Transaminitis ** confounded w/Amiodarone, Mexiletine   Spironolactone     Significant transaminitis     Antimicrobials this admission: cefepime 5/26 >>  vancomycin 5/26 >>  Ceftriaxone x 1 5/26  Dose adjustments this admission: 5/27 vanc 1 gm to 750 mg q24h  Microbiology results: 5/27 Bcx: pending 5/26 Sputum: ordered  5/26 resp panel:  Rhinovirus 5/26 MRSA PCR: + 5/26 Chest CT: New scattered ill-defined ground-glass densities and denser nodular opacities in both upper lobes, the right middle lobe, and the anterior left lower lobe, concerning for multifocal pneumonia  Thank you for allowing pharmacy to be a part of this patient's care.  Ceara Wrightson A, PharmD 11/11/2022 11:25  AM

## 2022-11-11 NOTE — Progress Notes (Signed)
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  During previous hospitalization patient had asymptomatic V. tach/V-fib and received ICD shock after that he was converted to sinus rhythm.  Due to frequent ICD discharges patient was started on amiodarone drip and discharged on p.o. amiodarone. Patient return for worsening shortness of breath.  Chest x-ray showed small bilateral pleural effusions and increasing pulmonary vascularity concerning for early CHF.  Cardiology was consulted.    Assessment/Plan:    Acute on chronic systolic CHF -Echocardiogram from 5/26 showed EF 30 to 35%, moderately decreased function of left ventricle with global hypokinesis -BNP elevated 1044; started on Lasix 40 mg IV every 12 hours -Continue to monitor strict intake and output -Cardiology following  Atrial fibrillation with RVR -Amiodarone stopped by cardiology due to worsening LFTs -Will try to control heart rate with metoprolol -Increase home dose of metoprolol to 25 mg p.o. twice daily -Will give additional dose of metoprolol 2.5 mg IV due to A-fib with RVR -Continue anticoagulation with apixaban  ? Multifocal pneumonia -Started on vancomycin and cefepime for multifocal pneumonia seen on CT chest -Patient is afebrile, normal WBC count, no increasing oxygen requirement -Procalcitonin is less than 0.10 -Discussed with ID, will stop antibiotics  COPD exacerbation -Started on Solu-Medrol 40 mg IV every 12 hours -Will discontinue scheduled DuoNebs due to worsening A-fib with RVR -Start Atrovent nebulizer every 6  hours -Continue Xopenex nebulizer every 6 hours as needed  Transaminitis -Presenting with worsening LFTs; was on amiodarone at discharge -Likely from hepatic congestion from CHF exacerbation -Follow LFTs in a.m.  Acute kidney injury -Creatinine up to 1.40 today with BUN 26 -Patient currently on IV Lasix -Follow BUN/creatinine in a.m.  History of V-fib/VT -S/p ICD discharges during previous admission -He was started on amiodarone which is currently discontinued due to transaminitis  CAD s/p MI  -DES RCA 2019 -no active chest pain     Recent Hx of Right hip fx s/p repair -PT when patient is more stable clinically  Medications     apixaban  2.5 mg Oral BID   furosemide  40 mg Intravenous Q12H   methylPREDNISolone (SOLU-MEDROL) injection  40 mg Intravenous Q12H   Followed by   Melene Muller ON 11/13/2022] predniSONE  40 mg Oral Q breakfast   metoprolol tartrate  25 mg Oral BID     Data Reviewed:   CBG:  No results for input(s): "GLUCAP" in the last 168 hours.  SpO2: 100 % O2 Flow Rate (L/min): 2 L/min FiO2 (%): 28 %    Vitals:   11/11/22 0117 11/11/22 0332 11/11/22 0814 11/11/22 1340  BP: 110/80 119/78 128/83 (!) 131/90  Pulse:  90 (!) 105 (!) 106  Resp:  18 20 (!) 30  Temp:  98.6 F (37 C) 98.1 F (36.7 C) (!) 97.4 F (36.3 C)  TempSrc:  Oral    SpO2:  95% 100% 100%  Weight:  65 kg    Height:          Data Reviewed:  Basic Metabolic Panel: Recent Labs  Lab 11/05/22 0821 11/06/22 9562 11/07/22 0452 11/10/22 0920 11/10/22 1246 11/11/22 0539  NA  136 137 132* 136 137 134*  K 4.0 3.5 4.0 3.5 3.6 4.2  CL 105 105 100 103 101 98  CO2 24 25 24 28 29 28   GLUCOSE 71 97 111* 110* 123* 157*  BUN 22 24* 26* 18 17 26*  CREATININE 1.28* 1.30* 1.27* 1.14 1.17 1.40*  CALCIUM 8.7* 8.5* 8.5* 8.7* 8.9 8.5*  MG 1.7 1.9 1.9  --  1.7  --   PHOS  --   --   --   --  3.3  --     CBC: Recent Labs  Lab 11/05/22 0830 11/06/22 0632 11/06/22 1852 11/07/22 0452  11/10/22 0920 11/11/22 0539  WBC 4.2 2.7*  --  7.5 6.1 9.4  NEUTROABS  --   --   --  6.6 4.9  --   HGB 8.2* 7.2* 8.8* 8.8* 9.0* 9.1*  HCT 25.9* 22.6* 26.1* 26.8* 29.0* 28.1*  MCV 92.2 90.4  --  89.6 93.5 90.1  PLT 109* 126*  --  157 193 224    LFT Recent Labs  Lab 11/05/22 0821 11/07/22 0452 11/10/22 0920 11/11/22 0539  AST 47* 61* 220* 391*  ALT 22 28 121* 222*  ALKPHOS 51 53 69 73  BILITOT 0.9 0.9 1.0 0.9  PROT 5.1* 5.3* 5.4* 5.8*  ALBUMIN 2.7* 2.6* 2.6* 2.8*     Antibiotics: Anti-infectives (From admission, onward)    Start     Dose/Rate Route Frequency Ordered Stop   11/11/22 2000  vancomycin (VANCOREADY) IVPB 750 mg/150 mL        750 mg 150 mL/hr over 60 Minutes Intravenous Every 24 hours 11/11/22 1125     11/11/22 1600  vancomycin (VANCOCIN) IVPB 1000 mg/200 mL premix  Status:  Discontinued        1,000 mg 200 mL/hr over 60 Minutes Intravenous Every 24 hours 11/10/22 1537 11/11/22 1125   11/11/22 0115  azithromycin (ZITHROMAX) 500 mg in sodium chloride 0.9 % 250 mL IVPB  Status:  Discontinued        500 mg 250 mL/hr over 60 Minutes Intravenous Every 24 hours 11/11/22 0022 11/11/22 0133   11/10/22 1600  ceFEPIme (MAXIPIME) 2 g in sodium chloride 0.9 % 100 mL IVPB        2 g 200 mL/hr over 30 Minutes Intravenous Every 12 hours 11/10/22 1533     11/10/22 1545  vancomycin (VANCOREADY) IVPB 1500 mg/300 mL        1,500 mg 150 mL/hr over 120 Minutes Intravenous  Once 11/10/22 1533 11/10/22 1813   11/10/22 1300  cefTRIAXone (ROCEPHIN) 1 g in sodium chloride 0.9 % 100 mL IVPB  Status:  Discontinued        1 g 200 mL/hr over 30 Minutes Intravenous Daily 11/10/22 1223 11/10/22 1532        DVT prophylaxis: Apixaban  Code Status: Full code  Family Communication:    CONSULTS cardiology   Subjective   Patient seen and examined, denies chest pain or shortness of breath.   Objective    Physical Examination:   General: Appears in no acute  distress Cardiovascular: S1-S2, regular Respiratory: Lungs clear to auscultation bilaterally Abdomen: Soft, nontender, no organomegaly Extremities: No edema in the lower extremities Neurologic: Alert, oriented x 3, no focal deficit noted   Status is: Inpatient:      Pressure Injury 11/05/22 Buttocks Bilateral Stage 2 -  Partial thickness loss of dermis presenting as a shallow open injury with a red, pink wound bed without slough. (Active)  11/05/22 1715  Location: Buttocks  Location Orientation: Bilateral  Staging: Stage 2 -  Partial thickness loss of dermis presenting as a shallow open injury with a red, pink wound bed without slough.  Wound Description (Comments):   Present on Admission:         Loriann Bosserman S Ingeborg Fite   [facility] If 7PM-7AM, please contact night-coverage at [email or web address], Office  [phone]   11/11/2022, 2:48 PM  LOS: 1 day

## 2022-11-11 NOTE — Progress Notes (Addendum)
Called by RN that patient having 10/10 chest pain.  Came to evaluate patient, patient lying comfortably in bed.  Patient says that he is having pain on both sides of chest, feels like burning and stinging, worse on deep breathing.  EKG obtained 10 minutes ago showed A-fib with RVR Dose of metoprolol changed to 25 mg twice daily and also given metoprolol 2.5 mg IV x 1.  On exam Bilateral wheezing auscultated  Chest pain likely atypical, will obtain troponin x 2. Will give 1 dose of morphine 1 mg IV. Will follow serial troponin Give treatment of Xopenex nebulizer  Critical care time spent 35 minutes

## 2022-11-11 NOTE — Progress Notes (Signed)
[facility] Cardiology    SUBJECTIVE: Feeling reasonably well denies any further knee pain shortness of breath is improved denies any chest pain resting comfortably in bed   Vitals:   11/10/22 2322 11/11/22 0117 11/11/22 0332 11/11/22 0814  BP: 119/85 110/80 119/78 128/83  Pulse: (!) 108  90 (!) 105  Resp: 20  18 20   Temp: 98.2 F (36.8 C)  98.6 F (37 C) 98.1 F (36.7 C)  TempSrc: Oral  Oral   SpO2: 99%  95% 100%  Weight:   65 kg   Height:         Intake/Output Summary (Last 24 hours) at 11/11/2022 1008 Last data filed at 11/11/2022 0500 Gross per 24 hour  Intake 1436.56 ml  Output 2300 ml  Net -863.44 ml      PHYSICAL EXAM  General: Well developed, well nourished, in no acute distress HEENT:  Normocephalic and atramatic Neck:  No JVD.  Lungs: Clear bilaterally to auscultation and percussion. Heart: Irregular irregular. Normal S1 and S2 without gallops or murmurs.  Abdomen: Bowel sounds are positive, abdomen soft and non-tender  Msk:  Back normal, normal gait. Normal strength and tone for age. Extremities: No clubbing, cyanosis or edema.   Neuro: Alert and oriented X 3. Psych:  Good affect, responds appropriately   LABS: Basic Metabolic Panel: Recent Labs    11/10/22 1246 11/11/22 0539  NA 137 134*  K 3.6 4.2  CL 101 98  CO2 29 28  GLUCOSE 123* 157*  BUN 17 26*  CREATININE 1.17 1.40*  CALCIUM 8.9 8.5*  MG 1.7  --   PHOS 3.3  --    Liver Function Tests: Recent Labs    11/10/22 0920 11/11/22 0539  AST 220* 391*  ALT 121* 222*  ALKPHOS 69 73  BILITOT 1.0 0.9  PROT 5.4* 5.8*  ALBUMIN 2.6* 2.8*   No results for input(s): "LIPASE", "AMYLASE" in the last 72 hours. CBC: Recent Labs    11/10/22 0920 11/11/22 0539  WBC 6.1 9.4  NEUTROABS 4.9  --   HGB 9.0* 9.1*  HCT 29.0* 28.1*  MCV 93.5 90.1  PLT 193 224   Cardiac Enzymes: No results for input(s): "CKTOTAL", "CKMB", "CKMBINDEX", "TROPONINI" in the last 72 hours. BNP: Invalid input(s):  "POCBNP" D-Dimer: No results for input(s): "DDIMER" in the last 72 hours. Hemoglobin A1C: No results for input(s): "HGBA1C" in the last 72 hours. Fasting Lipid Panel: No results for input(s): "CHOL", "HDL", "LDLCALC", "TRIG", "CHOLHDL", "LDLDIRECT" in the last 72 hours. Thyroid Function Tests: No results for input(s): "TSH", "T4TOTAL", "T3FREE", "THYROIDAB" in the last 72 hours.  Invalid input(s): "FREET3" Anemia Panel: No results for input(s): "VITAMINB12", "FOLATE", "FERRITIN", "TIBC", "IRON", "RETICCTPCT" in the last 72 hours.  ECHOCARDIOGRAM COMPLETE  Result Date: 11/10/2022    ECHOCARDIOGRAM REPORT   Patient Name:   Matthew Zamora Date of Exam: 11/10/2022 Medical Rec #:  [id number]           Height:       68.0 in Accession #:    [id number]          Weight:       153.0 lb Date of Birth:  26-Jun-1940           BSA:          1.824 m Patient Age:    81 years            BP:           126/82 mmHg Patient  Gender: M                   HR:           121 bpm. Exam Location:  [facility] Procedure: 2D Echo Indications:     CHF I50.21  History:         Patient has prior history of Echocardiogram examinations, most                  recent 04/21/2022.  Sonographer:     Overton Mam RDCS, FASE Referring Phys:  [id number] Beulah Gandy A THOMAS Diagnosing Phys: Julien Nordmann MD  Sonographer Comments: Technically difficult study due to poor echo windows and suboptimal parasternal window. Image acquisition challenging due to respiratory motion. IMPRESSIONS  1. Left ventricular ejection fraction, by estimation, is 30 to 35%. Left ventricular ejection fraction by PLAX is 34 %. The left ventricle has moderately decreased function. The left ventricle demonstrates global hypokinesis. The left ventricular internal cavity size was mildly dilated. Left ventricular diastolic parameters are indeterminate.  2. Right ventricular systolic function is normal. The right ventricular size is normal. There is moderately elevated pulmonary  artery systolic pressure. The estimated right ventricular systolic pressure is 51.2 mmHg.  3. The mitral valve is normal in structure. Mild mitral valve regurgitation. No evidence of mitral stenosis.  4. The aortic valve is tricuspid. Aortic valve regurgitation is not visualized. Aortic valve sclerosis is present, with no evidence of aortic valve stenosis.  5. The inferior vena cava is normal in size with <50% respiratory variability, suggesting right atrial pressure of 8 mmHg.  6. Rhythm is atrial fibrillation rate 102 to 130 bpm FINDINGS  Left Ventricle: Left ventricular ejection fraction, by estimation, is 30 to 35%. Left ventricular ejection fraction by PLAX is 34 %. The left ventricle has moderately decreased function. The left ventricle demonstrates global hypokinesis. The left ventricular internal cavity size was mildly dilated. There is no left ventricular hypertrophy. Left ventricular diastolic parameters are indeterminate. Right Ventricle: The right ventricular size is normal. No increase in right ventricular wall thickness. Right ventricular systolic function is normal. There is moderately elevated pulmonary artery systolic pressure. The tricuspid regurgitant velocity is 3.40 m/s, and with an assumed right atrial pressure of 5 mmHg, the estimated right ventricular systolic pressure is 51.2 mmHg. Left Atrium: Left atrial size was normal in size. Right Atrium: Right atrial size was normal in size. Pericardium: There is no evidence of pericardial effusion. Mitral Valve: The mitral valve is normal in structure. Mild mitral valve regurgitation. No evidence of mitral valve stenosis. Tricuspid Valve: The tricuspid valve is normal in structure. Tricuspid valve regurgitation is mild . No evidence of tricuspid stenosis. Aortic Valve: The aortic valve is tricuspid. Aortic valve regurgitation is not visualized. Aortic valve sclerosis is present, with no evidence of aortic valve stenosis. Aortic valve peak gradient  measures 10.8 mmHg. Pulmonic Valve: The pulmonic valve was normal in structure. Pulmonic valve regurgitation is not visualized. No evidence of pulmonic stenosis. Aorta: The aortic root is normal in size and structure. Venous: The inferior vena cava is normal in size with less than 50% respiratory variability, suggesting right atrial pressure of 8 mmHg. IAS/Shunts: No atrial level shunt detected by color flow Doppler. Additional Comments: A device lead is visualized.  LEFT VENTRICLE PLAX 2D LV EF:         Left            Diastology  ventricular     LV e' medial:    10.30 cm/s                ejection        LV E/e' medial:  11.2                fraction by     LV e' lateral:   16.20 cm/s                PLAX is 34      LV E/e' lateral: 7.1                %. LVIDd:         5.00 cm LVIDs:         4.20 cm LV PW:         1.10 cm LV IVS:        1.20 cm LVOT diam:     1.80 cm LV SV:         36 LV SV Index:   20 LVOT Area:     2.54 cm  LV Volumes (MOD) LV vol d, MOD    102.0 ml A4C: LV vol s, MOD    56.2 ml A4C: LV SV MOD A4C:   102.0 ml RIGHT VENTRICLE RV Basal diam:  2.70 cm RV S prime:     13.50 cm/s TAPSE (M-mode): 1.5 cm LEFT ATRIUM           Index        RIGHT ATRIUM           Index LA diam:      4.20 cm 2.30 cm/m   RA Area:     17.10 cm LA Vol (A2C): 44.0 ml 24.13 ml/m  RA Volume:   46.10 ml  25.28 ml/m LA Vol (A4C): 55.6 ml 30.49 ml/m  AORTIC VALVE                 PULMONIC VALVE AV Area (Vmax): 1.51 cm     RVOT Peak grad: 3 mmHg AV Vmax:        164.00 cm/s AV Peak Grad:   10.8 mmHg LVOT Vmax:      97.40 cm/s LVOT Vmean:     61.300 cm/s LVOT VTI:       0.141 m  AORTA Ao Root diam: 3.50 cm MITRAL VALVE                TRICUSPID VALVE MV Area (PHT): 3.21 cm     TR Peak grad:   46.2 mmHg MV Decel Time: 236 msec     TR Vmax:        340.00 cm/s MV E velocity: 115.00 cm/s                             SHUNTS                             Systemic VTI:  0.14 m                             Systemic Diam: 1.80 cm  Julien Nordmann MD Electronically signed by Julien Nordmann MD Signature Date/Time: 11/10/2022/6:47:31 PM    Final    CT CHEST WO CONTRAST  Result Date: 11/10/2022 CLINICAL DATA:  Chest pain and shortness of breath.  EXAM: CT CHEST WITHOUT CONTRAST TECHNIQUE: Multidetector CT imaging of the chest was performed following the standard protocol without IV contrast. RADIATION DOSE REDUCTION: This exam was performed according to the departmental dose-optimization program which includes automated exposure control, adjustment of the mA and/or kV according to patient size and/or use of iterative reconstruction technique. COMPARISON:  Chest x-ray from same day. CT chest dated October 15, 2022. FINDINGS: Cardiovascular: Unchanged left chest wall pacemaker. Unchanged mild cardiomegaly. No pericardial effusion. No thoracic aortic aneurysm. Coronary, aortic arch, and branch vessel atherosclerotic vascular disease. Mediastinum/Nodes: Enlarged right paratracheal lymph node currently measuring 1.5 cm in short axis, previously 1.1 cm. Additional subcentimeter mediastinal and right hilar lymph nodes are unchanged. No enlarged axillary lymph nodes. The thyroid gland, trachea, and esophagus demonstrate no significant findings. Lungs/Pleura: Small bilateral pleural effusions. Scattered mild smooth interlobular septal thickening. New scattered ill-defined ground-glass densities and denser nodular opacities in both upper lobes, the right middle lobe, and the anterior left lower lobe. Few new small nodules in the superior segment of the right lower lobe. Subsegmental atelectasis in both posterior lower lobes. No pneumothorax. Unchanged 6 mm nodule left lower lobe (series 3, image 83). Unchanged 5 mm nodule in the right lower lobe (series 3, image 89). The previously seen ground-glass nodule in the right upper lobe is no longer identified. Mild centrilobular emphysema. Upper Abdomen: No acute abnormality. Unchanged tiny gallstone. Partially  visualized complex cyst in the inferior right liver, as seen on prior studies. Musculoskeletal: No acute or significant osseous findings. IMPRESSION: 1. New scattered ill-defined ground-glass densities and denser nodular opacities in both upper lobes, the right middle lobe, and the anterior left lower lobe, concerning for multifocal pneumonia. 2. Small bilateral pleural effusions with mild interstitial pulmonary edema. 3. Enlarged right paratracheal lymph node currently measuring 1.5 cm in short axis, previously 1.1 cm, likely reactive. 4. Previously seen ground-glass nodule in the right upper lobe is no longer identified. Other previously identified small pulmonary nodules in both lower lobes are unchanged. 5. Aortic Atherosclerosis (ICD10-I70.0) and Emphysema (ICD10-J43.9). Electronically Signed   By: Obie Dredge M.D.   On: 11/10/2022 14:00   DG Chest 2 View  Result Date: 11/10/2022 CLINICAL DATA:  Shortness of breath. EXAM: CHEST - 2 VIEW COMPARISON:  10/30/22 FINDINGS: Left chest wall ICD noted with leads in the right atrial appendage and right ventricle. Heart size is normal. Aortic atherosclerosis. Small bilateral posterior layering pleural effusions are identified on the lateral projection radiograph. Increased pulmonary vascularity. No frank edema. No airspace consolidation. IMPRESSION: 1. Small bilateral pleural effusions and increased pulmonary vascularity concerning for early CHF. Electronically Signed   By: Signa Kell M.D.   On: 11/10/2022 10:04     Echo mildly depressed left ventricular function EF around 30 to 35%  TELEMETRY: Atrial fibrillation rapid ventricular response rate of 140 bundle branch block:  ASSESSMENT AND PLAN:  Principal Problem:   COPD exacerbation ([name]) Congestive heart failure systolic dysfunction Ischemic cardiomyopathy AICD in place Atrial fibrillation rapid ventricular response Hyperlipidemia Pneumonia hospital-acquired Hypoxemia Elevated  LFTs   Plan Continue antibiotic therapy supplemental oxygen for hospital-acquired pneumonia Congestive heart failure continue diuresis heart failure therapy Increase LFTs unclear etiology may be related to amiodarone so we will discontinue and switch to a different antiarrhythmic and rate control Mild renal insufficiency continue current therapy possibly related to diuresis COPD continue inhalers as necessary supplemental oxygen His AICD in place no discharges on this admission continue to correct electrolytes Continue hyperlipidemia management diet currently  not on statin with elevated LFTs Continue conservative cardiac management For rate control Recommend resuming metoprolol therapy to help with rate control   Alwyn Pea, MD, 11/11/2022 10:08 AM

## 2022-11-12 DIAGNOSIS — I482 Chronic atrial fibrillation, unspecified: Secondary | ICD-10-CM | POA: Diagnosis not present

## 2022-11-12 DIAGNOSIS — J441 Chronic obstructive pulmonary disease with (acute) exacerbation: Secondary | ICD-10-CM | POA: Diagnosis not present

## 2022-11-12 DIAGNOSIS — R7401 Elevation of levels of liver transaminase levels: Secondary | ICD-10-CM | POA: Diagnosis not present

## 2022-11-12 DIAGNOSIS — I5043 Acute on chronic combined systolic (congestive) and diastolic (congestive) heart failure: Secondary | ICD-10-CM | POA: Diagnosis not present

## 2022-11-12 LAB — MAGNESIUM: Magnesium: 2.1 mg/dL (ref 1.7–2.4)

## 2022-11-12 LAB — COMPREHENSIVE METABOLIC PANEL
ALT: 249 U/L — ABNORMAL HIGH (ref 0–44)
AST: 264 U/L — ABNORMAL HIGH (ref 15–41)
Albumin: 2.9 g/dL — ABNORMAL LOW (ref 3.5–5.0)
Alkaline Phosphatase: 72 U/L (ref 38–126)
Anion gap: 11 (ref 5–15)
BUN: 33 mg/dL — ABNORMAL HIGH (ref 8–23)
CO2: 26 mmol/L (ref 22–32)
Calcium: 8.9 mg/dL (ref 8.9–10.3)
Chloride: 98 mmol/L (ref 98–111)
Creatinine, Ser: 1.34 mg/dL — ABNORMAL HIGH (ref 0.61–1.24)
GFR, Estimated: 54 mL/min — ABNORMAL LOW (ref >60)
Glucose, Bld: 153 mg/dL — ABNORMAL HIGH (ref 70–99)
Potassium: 4.4 mmol/L (ref 3.5–5.1)
Sodium: 135 mmol/L (ref 135–145)
Total Bilirubin: 0.6 mg/dL (ref 0.3–1.2)
Total Protein: 6 g/dL — ABNORMAL LOW (ref 6.5–8.1)

## 2022-11-12 LAB — STREP PNEUMONIAE URINARY ANTIGEN: Strep Pneumo Urinary Antigen: NEGATIVE

## 2022-11-12 LAB — CBC
HCT: 28.5 % — ABNORMAL LOW (ref 39.0–52.0)
Hemoglobin: 9.2 g/dL — ABNORMAL LOW (ref 13.0–17.0)
MCH: 29.2 pg (ref 26.0–34.0)
MCHC: 32.3 g/dL (ref 30.0–36.0)
MCV: 90.5 fL (ref 80.0–100.0)
Platelets: 335 10*3/uL (ref 150–400)
RBC: 3.15 MIL/uL — ABNORMAL LOW (ref 4.22–5.81)
RDW: 14.3 % (ref 11.5–15.5)
WBC: 18.1 10*3/uL — ABNORMAL HIGH (ref 4.0–10.5)
nRBC: 0 % (ref 0.0–0.2)

## 2022-11-12 LAB — CULTURE, BLOOD (ROUTINE X 2)

## 2022-11-12 MED ORDER — METOPROLOL TARTRATE 50 MG PO TABS: 50.0000 mg | ORAL_TABLET | Freq: Two times a day (BID) | ORAL | Status: DC

## 2022-11-12 MED ORDER — METOPROLOL TARTRATE 25 MG PO TABS
25.0000 mg | ORAL_TABLET | Freq: Four times a day (QID) | ORAL | Status: DC
  Administered 2022-11-12 – 2022-11-13 (×6): 25 mg via ORAL
  Filled 2022-11-12 (×6): qty 1

## 2022-11-12 MED ORDER — SUCRALFATE 1 GM/10ML PO SUSP
1.0000 g | Freq: Three times a day (TID) | ORAL | Status: DC
  Administered 2022-11-12 – 2022-11-18 (×25): 1 g via ORAL
  Filled 2022-11-12 (×26): qty 10

## 2022-11-12 MED ORDER — IPRATROPIUM BROMIDE 0.02 % IN SOLN: 0.5000 mg | Freq: Four times a day (QID) | RESPIRATORY_TRACT | Status: DC | PRN

## 2022-11-12 MED ORDER — ALUM & MAG HYDROXIDE-SIMETH 200-200-20 MG/5ML PO SUSP
30.0000 mL | ORAL | Status: DC | PRN
  Administered 2022-11-12 – 2022-11-18 (×2): 30 mL via ORAL
  Filled 2022-11-12 (×2): qty 30

## 2022-11-12 MED ORDER — METOPROLOL TARTRATE 50 MG PO TABS: 50.0000 mg | ORAL_TABLET | Freq: Four times a day (QID) | ORAL | Status: DC

## 2022-11-12 MED ORDER — PANTOPRAZOLE SODIUM 40 MG PO TBEC
40.0000 mg | DELAYED_RELEASE_TABLET | Freq: Two times a day (BID) | ORAL | Status: DC
  Administered 2022-11-12 – 2022-11-18 (×13): 40 mg via ORAL
  Filled 2022-11-12 (×13): qty 1

## 2022-11-12 MED ORDER — APIXABAN 5 MG PO TABS
5.0000 mg | ORAL_TABLET | Freq: Two times a day (BID) | ORAL | Status: DC
  Administered 2022-11-12 – 2022-11-13 (×3): 5 mg via ORAL
  Filled 2022-11-12 (×3): qty 1

## 2022-11-12 MED ORDER — LEVALBUTEROL HCL 0.63 MG/3ML IN NEBU
0.6300 mg | INHALATION_SOLUTION | Freq: Four times a day (QID) | RESPIRATORY_TRACT | Status: DC
  Administered 2022-11-12 – 2022-11-13 (×5): 0.63 mg via RESPIRATORY_TRACT
  Filled 2022-11-12 (×5): qty 3

## 2022-11-12 MED ORDER — TAMSULOSIN HCL 0.4 MG PO CAPS
0.4000 mg | ORAL_CAPSULE | Freq: Every day | ORAL | Status: DC
  Administered 2022-11-12 – 2022-11-18 (×7): 0.4 mg via ORAL
  Filled 2022-11-12 (×7): qty 1

## 2022-11-12 MED ORDER — NYSTATIN 100000 UNIT/ML MT SUSP
5.0000 mL | Freq: Once | OROMUCOSAL | Status: AC
  Administered 2022-11-12: 500000 [IU] via ORAL
  Filled 2022-11-12: qty 5

## 2022-11-12 NOTE — Progress Notes (Signed)
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  During previous hospitalization patient had asymptomatic V. tach/V-fib and received ICD shock after that he was converted to sinus rhythm.  Due to frequent ICD discharges patient was started on amiodarone drip and discharged on p.o. amiodarone. Patient return for worsening shortness of breath.  Chest x-ray showed small bilateral pleural effusions and increasing pulmonary vascularity concerning for early CHF.  Cardiology was consulted.    Assessment/Plan:    Acute on chronic systolic CHF -Echocardiogram from 5/26 showed EF 30 to 35%, moderately decreased function of left ventricle with global hypokinesis -BNP elevated 1044; started on Lasix 40 mg IV every 12 hours -Continue to monitor strict intake and output -Cardiology following  Atrial fibrillation with RVR -Amiodarone stopped by cardiology due to worsening LFTs -Will try to control heart rate with metoprolol -Dose of metoprolol changed to 25 mg p.o. every 6 hours -Continue anticoagulation with apixaban -Continue metoprolol 2.5 mg IV every 6 hours as needed  ? Multifocal pneumonia -Started on vancomycin and cefepime for multifocal pneumonia seen on CT chest -Patient is afebrile, normal WBC count, no increasing oxygen requirement -Procalcitonin is less than 0.10, urinary strep pneumo antigen negative -Discussed with ID, antibiotics were discontinued  COPD exacerbation -Started on Solu-Medrol 40 mg IV every 12 hours -Discontinued  scheduled DuoNebs due to worsening A-fib with RVR -Started Atrovent  nebulizer every 6 hours -Continue Xopenex nebulizer every 6 hours as needed  Transaminitis -Presenting with worsening LFTs; was on amiodarone at discharge -Likely from hepatic congestion from CHF exacerbation -Follow LFTs in a.m.  Acute kidney injury -Creatinine 1.34 today -Patient currently on IV Lasix -Follow BUN/creatinine in a.m.  History of V-fib/VT -S/p ICD discharges during previous admission -He was started on amiodarone which is currently discontinued due to transaminitis  CAD s/p MI  -DES RCA 2019 -no active chest pain    Recent Hx of Right hip fx s/p repair -PT when patient is more stable clinically  Medications     apixaban  2.5 mg Oral BID   furosemide  40 mg Intravenous Q12H   guaiFENesin  600 mg Oral BID   ipratropium  0.5 mg Nebulization Q6H   methylPREDNISolone (SOLU-MEDROL) injection  40 mg Intravenous Q12H   Followed by   Melene Muller ON 11/13/2022] predniSONE  40 mg Oral Q breakfast   metoprolol tartrate  25 mg Oral BID     Data Reviewed:   CBG:  No results for input(s): "GLUCAP" in the last 168 hours.  SpO2: 98 % O2 Flow Rate (L/min): 2 L/min FiO2 (%): 28 %    Vitals:   11/12/22 0416 11/12/22 0433 11/12/22 0555 11/12/22 0757  BP: (!) 128/94 (!) 131/94 137/75   Pulse: (!) 119 (!) 124 (!) 151   Resp: 20 20 20    Temp: 97.8 F (36.6 C)  97.6 F (36.4 C)   TempSrc: Oral     SpO2: 99% 100% 99% 98%  Weight:  63.7 kg    Height:          Data Reviewed:  Basic Metabolic Panel: Recent Labs  Lab 11/05/22 0821 11/06/22 4010 11/07/22 [phone]  11/10/22 0920 11/10/22 1246 11/11/22 0539 11/12/22 0529  NA 136 137 132* 136 137 134* 135  K 4.0 3.5 4.0 3.5 3.6 4.2 4.4  CL 105 105 100 103 101 98 98  CO2 24 25 24 28 29 28 26   GLUCOSE 71 97 111* 110* 123* 157* 153*  BUN 22 24* 26* 18 17 26* 33*  CREATININE 1.28* 1.30* 1.27* 1.14 1.17 1.40* 1.34*  CALCIUM 8.7* 8.5* 8.5* 8.7* 8.9 8.5* 8.9  MG 1.7 1.9 1.9  --  1.7  --   --   PHOS  --   --   --   --   3.3  --   --     CBC: Recent Labs  Lab 11/06/22 0632 11/06/22 1852 11/07/22 0452 11/10/22 0920 11/11/22 0539 11/12/22 0529  WBC 2.7*  --  7.5 6.1 9.4 18.1*  NEUTROABS  --   --  6.6 4.9  --   --   HGB 7.2* 8.8* 8.8* 9.0* 9.1* 9.2*  HCT 22.6* 26.1* 26.8* 29.0* 28.1* 28.5*  MCV 90.4  --  89.6 93.5 90.1 90.5  PLT 126*  --  157 193 224 335    LFT Recent Labs  Lab 11/05/22 0821 11/07/22 0452 11/10/22 0920 11/11/22 0539 11/12/22 0529  AST 47* 61* 220* 391* 264*  ALT 22 28 121* 222* 249*  ALKPHOS 51 53 69 73 72  BILITOT 0.9 0.9 1.0 0.9 0.6  PROT 5.1* 5.3* 5.4* 5.8* 6.0*  ALBUMIN 2.7* 2.6* 2.6* 2.8* 2.9*     Antibiotics: Anti-infectives (From admission, onward)    Start     Dose/Rate Route Frequency Ordered Stop   11/11/22 2000  vancomycin (VANCOREADY) IVPB 750 mg/150 mL  Status:  Discontinued        750 mg 150 mL/hr over 60 Minutes Intravenous Every 24 hours 11/11/22 1125 11/11/22 1509   11/11/22 1600  vancomycin (VANCOCIN) IVPB 1000 mg/200 mL premix  Status:  Discontinued        1,000 mg 200 mL/hr over 60 Minutes Intravenous Every 24 hours 11/10/22 1537 11/11/22 1125   11/11/22 0115  azithromycin (ZITHROMAX) 500 mg in sodium chloride 0.9 % 250 mL IVPB  Status:  Discontinued        500 mg 250 mL/hr over 60 Minutes Intravenous Every 24 hours 11/11/22 0022 11/11/22 0133   11/10/22 1600  ceFEPIme (MAXIPIME) 2 g in sodium chloride 0.9 % 100 mL IVPB  Status:  Discontinued        2 g 200 mL/hr over 30 Minutes Intravenous Every 12 hours 11/10/22 1533 11/11/22 1509   11/10/22 1545  vancomycin (VANCOREADY) IVPB 1500 mg/300 mL        1,500 mg 150 mL/hr over 120 Minutes Intravenous  Once 11/10/22 1533 11/10/22 1813   11/10/22 1300  cefTRIAXone (ROCEPHIN) 1 g in sodium chloride 0.9 % 100 mL IVPB  Status:  Discontinued        1 g 200 mL/hr over 30 Minutes Intravenous Daily 11/10/22 1223 11/10/22 1532        DVT prophylaxis: Apixaban  Code Status: Full code  Family  Communication:    CONSULTS cardiology   Subjective    Patient seen and examined, heart rate still elevated.  This morning heart rate was in 140s despite being on metoprolol.  Objective    Physical Examination:   General-appears in no acute distress Heart-S1-S2, irregular, no murmur auscultated Lungs-clear to auscultation bilaterally, no wheezing or crackles auscultated Abdomen-soft, nontender, no organomegaly Extremities-no edema in  the lower extremities Neuro-alert, oriented x3, no focal deficit noted   Status is: Inpatient:      Pressure Injury 11/05/22 Buttocks Bilateral Stage 2 -  Partial thickness loss of dermis presenting as a shallow open injury with a red, pink wound bed without slough. (Active)  11/05/22 1715  Location: Buttocks  Location Orientation: Bilateral  Staging: Stage 2 -  Partial thickness loss of dermis presenting as a shallow open injury with a red, pink wound bed without slough.  Wound Description (Comments):   Present on Admission:         Aimi Essner S Kyjuan Gause   [facility] If 7PM-7AM, please contact night-coverage at [email or web address], Office  [phone]   11/12/2022, 8:07 AM  LOS: 2 days

## 2022-11-12 NOTE — Progress Notes (Signed)
[facility] CLINIC CARDIOLOGY CONSULT NOTE       Patient ID: Matthew Zamora MRN: [id number] DOB/AGE: 1941/05/23 82 y.o.  Admit date: 11/10/2022 Referring Physician Dr. Skip Mayer Primary Physician Dr. Jerl Mina Primary Cardiologist Derwood Kaplan, NP [facility] Cardiology)  Reason for Consultation CHF  HPI: Matthew Zamora. 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 During that admission the patient's defibrillator shocked him for VF, subsequently loaded with amiodarone. He was discharged to a SNF on 5/23. He presented back to [facility] ED on 5/26 with shortness of breath. Cardiology consulted for assistance with his CHF.   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 pylori infection    History of hepatitis    Hyperlipidemia    Hypertension    Liver cyst 05/16/15   PAF (paroxysmal atrial fibrillation) ([name])    Prostate CA ([name])     Past Surgical History:  Procedure Laterality Date   CATARACT EXTRACTION     COLONOSCOPY  10/06/2014, 09/18/2004, 03/14/2009   ESOPHAGOGASTRODUODENOSCOPY  10/06/2014, 03/18/2001, 03/14/2009    ESOPHAGOGASTRODUODENOSCOPY (EGD) WITH PROPOFOL N/A 05/07/2018   Procedure: ESOPHAGOGASTRODUODENOSCOPY (EGD) WITH PROPOFOL;  Surgeon: Toledo, Boykin Nearing, MD;  Location: [facility] ENDOSCOPY;  Service: Gastroenterology;  Laterality: N/A;   ESOPHAGOGASTRODUODENOSCOPY (EGD) WITH PROPOFOL N/A 04/22/2022   Procedure: ESOPHAGOGASTRODUODENOSCOPY (EGD) WITH PROPOFOL;  Surgeon: Toney Reil, MD;  Location: [facility] ENDOSCOPY;  Service: Endoscopy;  Laterality: N/A;   FLEXIBLE SIGMOIDOSCOPY  08/26/1990   HIP ARTHROPLASTY Right 10/31/2022   Procedure: ARTHROPLASTY BIPOLAR HIP (HEMIARTHROPLASTY)-RNFA;  Surgeon: Reinaldo Berber, MD;  Location: [facility] ORS;  Service: Orthopedics;  Laterality: Right;   INSERTION OF ICD     PROSTATE SURGERY     TONSILLECTOMY      Medications Prior to Admission  Medication Sig Dispense Refill Last Dose   amiodarone (PACERONE) 400 MG tablet Take 1 tablet (400 mg total) by mouth 2 (two) times daily for 10 days. Followed by 400mg  daily 20 tablet 0 11/09/2022 at 2245   ascorbic acid (VITAMIN C) 500 MG tablet Take 1 tablet by mouth daily.   11/09/2022 at 0947   atorvastatin (LIPITOR) 80 MG tablet Take 80 mg by mouth daily.   11/09/2022 at 1731   calcium carbonate (TUMS EX) 750 MG chewable tablet Chew 1 tablet by mouth daily.   11/09/2022 at 0947   ELIQUIS 2.5 MG TABS tablet Take 2.5 mg by mouth 2 (two) times daily.   11/09/2022 at 2245   fluticasone-salmeterol (ADVAIR) 500-50 MCG/ACT AEPB Inhale 1 puff into the lungs in the morning and  at bedtime.   11/10/2022 at 1126   gabapentin (NEURONTIN) 100 MG capsule Take 100 mg by mouth at bedtime.   11/09/2022 at 2245   HYDROcodone-acetaminophen (NORCO/VICODIN) 5-325 MG tablet Take 1-2 tablets by mouth every 4 (four) hours as needed for moderate pain (pain score 4-6). 30 tablet 0 11/10/2022 at 1223   magnesium oxide (MAG-OX) 400 (240 Mg) MG tablet Take 1 tablet by mouth daily.   11/09/2022 at 0947   melatonin 3 MG TABS tablet Take 9 mg by mouth at bedtime.    11/09/2022 at 2245   metoprolol tartrate (LOPRESSOR) 25 MG tablet Take 0.5 tablets (12.5 mg total) by mouth 2 (two) times daily.   11/09/2022 at 2245   midodrine (PROAMATINE) 5 MG tablet Take 3 tablets (15 mg total) by mouth 3 (three) times daily with meals.   11/09/2022 at 2245   Multiple Vitamin (MULTIVITAMIN WITH MINERALS) TABS tablet Take 1 tablet by mouth daily.   11/09/2022 at 0947   pantoprazole (PROTONIX) 40 MG tablet Take 1 tablet (40 mg total) by mouth 2 (two) times daily.   11/09/2022 at 1647   polyethylene glycol (MIRALAX / GLYCOLAX) 17 g packet Take 17 g by mouth 2 (two) times daily. 14 each 0 11/09/2022 at 1647   pyridostigmine (MESTINON) 60 MG tablet Take 1 tablet (60 mg total) by mouth every 8 (eight) hours.   11/10/2022 at 0637   sertraline (ZOLOFT) 25 MG tablet Take 25 mg by mouth daily.   11/09/2022 at 0947   sucralfate (CARAFATE) 1 GM/10ML suspension Take 10 mLs (1 g total) by mouth 4 (four) times daily -  with meals and at bedtime. 420 mL 0 11/09/2022 at 2245   tamsulosin (FLOMAX) 0.4 MG CAPS capsule Take 0.4 mg by mouth daily.   11/09/2022 at 0947   acetaminophen (TYLENOL) 325 MG tablet Take 2 tablets (650 mg total) by mouth every 4 (four) hours as needed for headache or mild pain.   11/08/2022 at 1401   albuterol (PROVENTIL) (2.5 MG/3ML) 0.083% nebulizer solution Inhale 3 mLs (2.5 mg total) into the lungs as needed for shortness of breath. 75 mL 12 PRN at PRN   albuterol (VENTOLIN HFA) 108 (90 Base) MCG/ACT inhaler Inhale 2 puffs into the lungs every 4 (four) hours as needed.   11/08/2022 at 1401   [START ON 11/17/2022] amiodarone (PACERONE) 400 MG tablet Take 1 tablet (400 mg total) by mouth daily. To be started after completing 10 days of the twice daily dosing 30 tablet 0    metoCLOPramide (REGLAN) 5 MG tablet Take 1 tablet (5 mg total) by mouth every 8 (eight) hours as needed for refractory nausea / vomiting.   PRN at PRN   Nebulizer MISC 1 each by Does not apply route as needed. 1 each 0     Nutritional Supplements (,FEEDING SUPPLEMENT, PROSOURCE PLUS) liquid Take 30 mLs by mouth 3 (three) times daily between meals.   11/08/2022 at 1125   ondansetron (ZOFRAN-ODT) 4 MG disintegrating tablet Take 4 mg by mouth every 8 (eight) hours as needed for refractory nausea / vomiting, vomiting or nausea.   PRN at PRN   Social History   Socioeconomic History   Marital status: Single    Spouse name: Not on file   Number of children: Not on file   Years of education: Not on file   Highest education level: Not on file  Occupational History   Not on file  Tobacco Use   Smoking status: Former  Packs/day: 1.00    Years: 38.00    Additional pack years: 0.00    Total pack years: 38.00    Types: Cigarettes    Quit date: 06/18/1999    Years since quitting: 23.4   Smokeless tobacco: Never  Vaping Use   Vaping Use: Never used  Substance and Sexual Activity   Alcohol use: No    Alcohol/week: 0.0 standard drinks of alcohol   Drug use: Not Currently   Sexual activity: Not Currently  Other Topics Concern   Not on file  Social History Narrative   Not on file   Social Determinants of Health   Financial Resource Strain: Not on file  Food Insecurity: No Food Insecurity (11/10/2022)   Hunger Vital Sign    Worried About Running Out of Food in the Last Year: Never true    Ran Out of Food in the Last Year: Never true  Transportation Needs: No Transportation Needs (11/10/2022)   PRAPARE - Administrator, Civil Service (Medical): No    Lack of Transportation (Non-Medical): No  Physical Activity: Not on file  Stress: Not on file  Social Connections: Not on file  Intimate Partner Violence: Not At Risk (11/10/2022)   Humiliation, Afraid, Rape, and Kick questionnaire    Fear of Current or Ex-Partner: No    Emotionally Abused: No    Physically Abused: No    Sexually Abused: No    Family History  Problem Relation Age of Onset   Heart attack Mother    Heart attack Father        Intake/Output Summary (Last 24 hours) at 11/12/2022 0930 Last data filed at 11/12/2022 0800 Gross per 24 hour  Intake 521.45 ml  Output 2600 ml  Net -2078.55 ml    Vitals:   11/12/22 0433 11/12/22 0555 11/12/22 0757 11/12/22 0800  BP: (!) 131/94 137/75  131/86  Pulse: (!) 124 (!) 151  (!) 106  Resp: 20 20  16   Temp:  97.6 F (36.4 C)  98 F (36.7 C)  TempSrc:    Oral  SpO2: 100% 99% 98% 100%  Weight: 63.7 kg     Height:        PHYSICAL EXAM General: elderly male , well nourished, in no acute distress. Sitting at incline in bed HEENT:  Normocephalic and atraumatic. Neck:  No JVD.  Lungs: Normal respiratory effort on 2L [place]. Inspiratory and expiratory wheezing with poor air movement, crackles on R base Heart: tachy irregularly irregular . Normal S1 and S2 without gallops or murmurs.  Abdomen: Non-distended appearing.  Msk: Normal strength and tone for age. Extremities: Warm and well perfused. No clubbing, cyanosis.  No peripheral edema.  Neuro: Alert and oriented X 3. Psych:  Answers questions appropriately.   Labs: Basic Metabolic Panel: Recent Labs    11/10/22 1246 11/11/22 0539 11/12/22 0529  NA 137 134* 135  K 3.6 4.2 4.4  CL 101 98 98  CO2 29 28 26   GLUCOSE 123* 157* 153*  BUN 17 26* 33*  CREATININE 1.17 1.40* 1.34*  CALCIUM 8.9 8.5* 8.9  MG 1.7  --   --   PHOS 3.3  --   --    Liver Function Tests: Recent Labs    11/11/22 0539 11/12/22 0529  AST 391* 264*  ALT 222* 249*  ALKPHOS 73 72  BILITOT 0.9 0.6  PROT 5.8* 6.0*  ALBUMIN 2.8* 2.9*   No results for input(s): "LIPASE", "AMYLASE" in the last 72 hours.  CBC: Recent Labs    11/10/22 0920 11/11/22 0539 11/12/22 0529  WBC 6.1 9.4 18.1*  NEUTROABS 4.9  --   --   HGB 9.0* 9.1* 9.2*  HCT 29.0* 28.1* 28.5*  MCV 93.5 90.1 90.5  PLT 193 224 335   Cardiac Enzymes: Recent Labs    11/10/22 1246 11/11/22 1633 11/11/22 1850  TROPONINIHS 19* 21* 23*   BNP: Recent Labs    11/10/22 0920  11/11/22 0810  BNP 1,304.2* 1,044.6*   D-Dimer: No results for input(s): "DDIMER" in the last 72 hours. Hemoglobin A1C: No results for input(s): "HGBA1C" in the last 72 hours. Fasting Lipid Panel: No results for input(s): "CHOL", "HDL", "LDLCALC", "TRIG", "CHOLHDL", "LDLDIRECT" in the last 72 hours. Thyroid Function Tests: No results for input(s): "TSH", "T4TOTAL", "T3FREE", "THYROIDAB" in the last 72 hours.  Invalid input(s): "FREET3" Anemia Panel: No results for input(s): "VITAMINB12", "FOLATE", "FERRITIN", "TIBC", "IRON", "RETICCTPCT" in the last 72 hours.   Radiology: ECHOCARDIOGRAM COMPLETE  Result Date: 11/10/2022    ECHOCARDIOGRAM REPORT   Patient Name:   DJANGO ANTHIS Date of Exam: 11/10/2022 Medical Rec #:  [id number]           Height:       68.0 in Accession #:    [id number]          Weight:       153.0 lb Date of Birth:  Dec 31, 1940           BSA:          1.824 m Patient Age:    81 years            BP:           126/82 mmHg Patient Gender: M                   HR:           121 bpm. Exam Location:  [facility] Procedure: 2D Echo Indications:     CHF I50.21  History:         Patient has prior history of Echocardiogram examinations, most                  recent 04/21/2022.  Sonographer:     Overton Mam RDCS, FASE Referring Phys:  [id number] Beulah Gandy A THOMAS Diagnosing Phys: Julien Nordmann MD  Sonographer Comments: Technically difficult study due to poor echo windows and suboptimal parasternal window. Image acquisition challenging due to respiratory motion. IMPRESSIONS  1. Left ventricular ejection fraction, by estimation, is 30 to 35%. Left ventricular ejection fraction by PLAX is 34 %. The left ventricle has moderately decreased function. The left ventricle demonstrates global hypokinesis. The left ventricular internal cavity size was mildly dilated. Left ventricular diastolic parameters are indeterminate.  2. Right ventricular systolic function is normal. The right ventricular size is  normal. There is moderately elevated pulmonary artery systolic pressure. The estimated right ventricular systolic pressure is 51.2 mmHg.  3. The mitral valve is normal in structure. Mild mitral valve regurgitation. No evidence of mitral stenosis.  4. The aortic valve is tricuspid. Aortic valve regurgitation is not visualized. Aortic valve sclerosis is present, with no evidence of aortic valve stenosis.  5. The inferior vena cava is normal in size with <50% respiratory variability, suggesting right atrial pressure of 8 mmHg.  6. Rhythm is atrial fibrillation rate 102 to 130 bpm FINDINGS  Left Ventricle: Left ventricular ejection fraction, by estimation, is 30 to 35%. Left ventricular ejection fraction  by PLAX is 34 %. The left ventricle has moderately decreased function. The left ventricle demonstrates global hypokinesis. The left ventricular internal cavity size was mildly dilated. There is no left ventricular hypertrophy. Left ventricular diastolic parameters are indeterminate. Right Ventricle: The right ventricular size is normal. No increase in right ventricular wall thickness. Right ventricular systolic function is normal. There is moderately elevated pulmonary artery systolic pressure. The tricuspid regurgitant velocity is 3.40 m/s, and with an assumed right atrial pressure of 5 mmHg, the estimated right ventricular systolic pressure is 51.2 mmHg. Left Atrium: Left atrial size was normal in size. Right Atrium: Right atrial size was normal in size. Pericardium: There is no evidence of pericardial effusion. Mitral Valve: The mitral valve is normal in structure. Mild mitral valve regurgitation. No evidence of mitral valve stenosis. Tricuspid Valve: The tricuspid valve is normal in structure. Tricuspid valve regurgitation is mild . No evidence of tricuspid stenosis. Aortic Valve: The aortic valve is tricuspid. Aortic valve regurgitation is not visualized. Aortic valve sclerosis is present, with no evidence of  aortic valve stenosis. Aortic valve peak gradient measures 10.8 mmHg. Pulmonic Valve: The pulmonic valve was normal in structure. Pulmonic valve regurgitation is not visualized. No evidence of pulmonic stenosis. Aorta: The aortic root is normal in size and structure. Venous: The inferior vena cava is normal in size with less than 50% respiratory variability, suggesting right atrial pressure of 8 mmHg. IAS/Shunts: No atrial level shunt detected by color flow Doppler. Additional Comments: A device lead is visualized.  LEFT VENTRICLE PLAX 2D LV EF:         Left            Diastology                ventricular     LV e' medial:    10.30 cm/s                ejection        LV E/e' medial:  11.2                fraction by     LV e' lateral:   16.20 cm/s                PLAX is 34      LV E/e' lateral: 7.1                %. LVIDd:         5.00 cm LVIDs:         4.20 cm LV PW:         1.10 cm LV IVS:        1.20 cm LVOT diam:     1.80 cm LV SV:         36 LV SV Index:   20 LVOT Area:     2.54 cm  LV Volumes (MOD) LV vol d, MOD    102.0 ml A4C: LV vol s, MOD    56.2 ml A4C: LV SV MOD A4C:   102.0 ml RIGHT VENTRICLE RV Basal diam:  2.70 cm RV S prime:     13.50 cm/s TAPSE (M-mode): 1.5 cm LEFT ATRIUM           Index        RIGHT ATRIUM           Index LA diam:      4.20 cm 2.30 cm/m   RA Area:     17.10  cm LA Vol (A2C): 44.0 ml 24.13 ml/m  RA Volume:   46.10 ml  25.28 ml/m LA Vol (A4C): 55.6 ml 30.49 ml/m  AORTIC VALVE                 PULMONIC VALVE AV Area (Vmax): 1.51 cm     RVOT Peak grad: 3 mmHg AV Vmax:        164.00 cm/s AV Peak Grad:   10.8 mmHg LVOT Vmax:      97.40 cm/s LVOT Vmean:     61.300 cm/s LVOT VTI:       0.141 m  AORTA Ao Root diam: 3.50 cm MITRAL VALVE                TRICUSPID VALVE MV Area (PHT): 3.21 cm     TR Peak grad:   46.2 mmHg MV Decel Time: 236 msec     TR Vmax:        340.00 cm/s MV E velocity: 115.00 cm/s                             SHUNTS                             Systemic VTI:  0.14 m                              Systemic Diam: 1.80 cm Julien Nordmann MD Electronically signed by Julien Nordmann MD Signature Date/Time: 11/10/2022/6:47:31 PM    Final    CT CHEST WO CONTRAST  Result Date: 11/10/2022 CLINICAL DATA:  Chest pain and shortness of breath. EXAM: CT CHEST WITHOUT CONTRAST TECHNIQUE: Multidetector CT imaging of the chest was performed following the standard protocol without IV contrast. RADIATION DOSE REDUCTION: This exam was performed according to the departmental dose-optimization program which includes automated exposure control, adjustment of the mA and/or kV according to patient size and/or use of iterative reconstruction technique. COMPARISON:  Chest x-ray from same day. CT chest dated October 15, 2022. FINDINGS: Cardiovascular: Unchanged left chest wall pacemaker. Unchanged mild cardiomegaly. No pericardial effusion. No thoracic aortic aneurysm. Coronary, aortic arch, and branch vessel atherosclerotic vascular disease. Mediastinum/Nodes: Enlarged right paratracheal lymph node currently measuring 1.5 cm in short axis, previously 1.1 cm. Additional subcentimeter mediastinal and right hilar lymph nodes are unchanged. No enlarged axillary lymph nodes. The thyroid gland, trachea, and esophagus demonstrate no significant findings. Lungs/Pleura: Small bilateral pleural effusions. Scattered mild smooth interlobular septal thickening. New scattered ill-defined ground-glass densities and denser nodular opacities in both upper lobes, the right middle lobe, and the anterior left lower lobe. Few new small nodules in the superior segment of the right lower lobe. Subsegmental atelectasis in both posterior lower lobes. No pneumothorax. Unchanged 6 mm nodule left lower lobe (series 3, image 83). Unchanged 5 mm nodule in the right lower lobe (series 3, image 89). The previously seen ground-glass nodule in the right upper lobe is no longer identified. Mild centrilobular emphysema. Upper Abdomen: No acute  abnormality. Unchanged tiny gallstone. Partially visualized complex cyst in the inferior right liver, as seen on prior studies. Musculoskeletal: No acute or significant osseous findings. IMPRESSION: 1. New scattered ill-defined ground-glass densities and denser nodular opacities in both upper lobes, the right middle lobe, and the anterior left lower lobe, concerning for multifocal pneumonia. 2. Small bilateral pleural effusions  with mild interstitial pulmonary edema. 3. Enlarged right paratracheal lymph node currently measuring 1.5 cm in short axis, previously 1.1 cm, likely reactive. 4. Previously seen ground-glass nodule in the right upper lobe is no longer identified. Other previously identified small pulmonary nodules in both lower lobes are unchanged. 5. Aortic Atherosclerosis (ICD10-I70.0) and Emphysema (ICD10-J43.9). Electronically Signed   By: Obie Dredge M.D.   On: 11/10/2022 14:00   DG Chest 2 View  Result Date: 11/10/2022 CLINICAL DATA:  Shortness of breath. EXAM: CHEST - 2 VIEW COMPARISON:  10/30/22 FINDINGS: Left chest wall ICD noted with leads in the right atrial appendage and right ventricle. Heart size is normal. Aortic atherosclerosis. Small bilateral posterior layering pleural effusions are identified on the lateral projection radiograph. Increased pulmonary vascularity. No frank edema. No airspace consolidation. IMPRESSION: 1. Small bilateral pleural effusions and increased pulmonary vascularity concerning for early CHF. Electronically Signed   By: Signa Kell M.D.   On: 11/10/2022 10:04   DG Chest Port 1 View  Result Date: 11/04/2022 CLINICAL DATA:  Cough EXAM: PORTABLE CHEST 1 VIEW COMPARISON:  X-ray 10/30/2022 FINDINGS: Underinflation compared to the prior x-ray. Stable cardiopericardial silhouette with calcified aorta. Left upper chest defibrillator with leads overlying the right side of the heart. Vascular congestion. No pneumothorax or effusion. Question trace edema. No  separate consolidation IMPRESSION: Vascular congestion with question trace edema. Defibrillator. Decreased inflation compared to prior x-ray.  Recommend follow-up Electronically Signed   By: Karen Kays M.D.   On: 11/04/2022 10:38   DG Pelvis Portable  Result Date: 10/31/2022 CLINICAL DATA:  Bipolar right hip hemiarthroplasty. EXAM: PORTABLE PELVIS 1-2 VIEWS COMPARISON:  CT right hip 11/02/2022 FINDINGS: Single frontal view of the bilateral hips. Interval bipolar right hip hemiarthroplasty with normal alignment. Expected postoperative subcutaneous air about the right hip. Mild superomedial left femoroacetabular joint space narrowing. Surgical clips overlie the pelvis. No acute fracture or dislocation. IMPRESSION: Interval bipolar right hip hemiarthroplasty with normal alignment. Electronically Signed   By: Neita Garnet M.D.   On: 10/31/2022 16:23   CT Hip Right Wo Contrast  Result Date: 10/30/2022 CLINICAL DATA:  Right femoral fracture EXAM: CT OF THE RIGHT HIP WITHOUT CONTRAST TECHNIQUE: Multidetector CT imaging of the right hip was performed according to the standard protocol. Multiplanar CT image reconstructions were also generated. RADIATION DOSE REDUCTION: This exam was performed according to the departmental dose-optimization program which includes automated exposure control, adjustment of the mA and/or kV according to patient size and/or use of iterative reconstruction technique. COMPARISON:  Right hips and pelvis radiograph dated 10/30/2022 FINDINGS: Bones/Joint/Cartilage Acute transcervical fracture of the right proximal femur with apex anterior angulation. The right femoroacetabular joint is intact. Ligaments Suboptimally assessed by CT. Muscles and Tendons Grossly intact. Soft tissues Vascular calcifications of the partially imaged right lower extremity arteries. IMPRESSION: Mildly angulated right femoral transcervical fracture. Electronically Signed   By: Agustin Cree M.D.   On: 10/30/2022 20:49    DG Knee 2 Views Right  Result Date: 10/30/2022 CLINICAL DATA:  Right knee pain, fall EXAM: RIGHT KNEE - 1-2 VIEW COMPARISON:  None Available. FINDINGS: Moderate tricompartment degenerative changes with joint space narrowing and spurring. No joint effusion. No acute bony abnormality. Specifically, no fracture, subluxation, or dislocation. IMPRESSION: Moderate degenerative changes.  No acute bony abnormality. Electronically Signed   By: Charlett Nose M.D.   On: 10/30/2022 20:25   DG Hip Unilat W or Wo Pelvis 2-3 Views Right  Result Date: 10/30/2022 CLINICAL DATA:  Fall,  right hip pain EXAM: DG HIP (WITH OR WITHOUT PELVIS) 2-3V RIGHT COMPARISON:  None Available. FINDINGS: There is foreshortening of the right femoral neck with cortical irregularity noted laterally concerning for right femoral neck fracture. No subluxation or dislocation. Mild symmetric degenerative changes in the hips with joint space narrowing and spurring. IMPRESSION: Foreshortening of the right femoral neck with cortical irregularity laterally concerning for femoral neck fracture. Electronically Signed   By: Charlett Nose M.D.   On: 10/30/2022 20:24   DG Chest Portable 1 View  Result Date: 10/30/2022 CLINICAL DATA:  Fall, right hip pain EXAM: PORTABLE CHEST 1 VIEW COMPARISON:  09/14/2022 FINDINGS: Left AICD remains in place, unchanged. Heart is normal size. Mediastinal contours within normal limits. No confluent opacities or effusions. No acute bony abnormality. IMPRESSION: No active disease. Electronically Signed   By: Charlett Nose M.D.   On: 10/30/2022 20:22   CT HEAD WO CONTRAST ( )  Result Date: 10/30/2022 CLINICAL DATA:  Trip and fall injury. EXAM: CT HEAD WITHOUT CONTRAST CT CERVICAL SPINE WITHOUT CONTRAST TECHNIQUE: Multidetector CT imaging of the head and cervical spine was performed following the standard protocol without intravenous contrast. Multiplanar CT image reconstructions of the cervical spine were also generated.  RADIATION DOSE REDUCTION: This exam was performed according to the departmental dose-optimization program which includes automated exposure control, adjustment of the mA and/or kV according to patient size and/or use of iterative reconstruction technique. COMPARISON:  Head CT without contrast 04/17/2022, cervical spine CT 03/27/2022. FINDINGS: CT HEAD FINDINGS Brain: There is mild atrophy, small-vessel disease and atrophic ventriculomegaly. No new asymmetry is seen concerning for an acute infarct, hemorrhage or mass. There is no midline shift. The basal cisterns are clear. Vascular: There are patchy calcifications of the carotid siphons but no hyperdense central vessels. Skull: No fracture or focal lesions.  Mild calvarial osteopenia. Sinuses/Orbits: No acute findings. Clear visualized sinuses and mastoid air cells. Negative orbits apart from old lens replacements. Other: None. CT CERVICAL SPINE FINDINGS Alignment: Unchanged. There is a trace 2 mm C3-4 retrolisthesis, similar minimal C4-5 anterolisthesis and trace C5-6 and C6-7 retrolisthesis all believed degenerative and all unchanged. No traumatic listhesis is seen. There is bone-on-bone anterior atlantodental joint space loss with osteophytes, and degenerative cystic changes in the odontoid process. Skull base and vertebrae: No acute fracture is evident. Bone mineralization is osteopenic. No primary bone lesion or focal pathologic process is seen. Soft tissues and spinal canal: No prevertebral fluid or swelling. No visible canal hematoma. There are calcifications in both proximal cervical ICAs, heaviest on the left where there is probably a flow-limiting origin stenosis. No laryngeal or thyroid mass. Disc levels: There is moderate disc space loss again at C3-4, C5-6 and C6-7, mild disc narrowing at C3-4, normal disc heights at C2-3 and C7-T1. There are small bidirectional endplate spurs from C3-4 through C6-7, causing partial effacement of the ventral CSF without  spondylotic cord compression. Facet joint spurring on the left-greater-than-right is seen at most levels with bilateral uncinate spurring. Acquired foraminal stenosis is mild on the right at C2-3, severe on the left and moderate on the right at C3-4, bilaterally mild-to-moderate C6-7, and not seen at the remaining levels. Upper chest: Pacemaker wiring is partially visible entering from the left. Otherwise negative. Other: None. IMPRESSION: 1. No acute intracranial CT findings or depressed skull fractures. 2. Osteopenia and degenerative change without evidence of cervical fractures or traumatic listhesis. 3. 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 Follow-up as  indicated. Electronically Signed   By: Almira Bar M.D.   On: 10/30/2022 20:15   CT Cervical Spine Wo Contrast  Result Date: 10/30/2022 CLINICAL DATA:  Trip and fall injury. EXAM: CT HEAD WITHOUT CONTRAST CT CERVICAL SPINE WITHOUT CONTRAST TECHNIQUE: Multidetector CT imaging of the head and cervical spine was performed following the standard protocol without intravenous contrast. Multiplanar CT image reconstructions of the cervical spine were also generated. RADIATION DOSE REDUCTION: This exam was performed according to the departmental dose-optimization program which includes automated exposure control, adjustment of the mA and/or kV according to patient size and/or use of iterative reconstruction technique. COMPARISON:  Head CT without contrast 04/17/2022, cervical spine CT 03/27/2022. FINDINGS: CT HEAD FINDINGS Brain: There is mild atrophy, small-vessel disease and atrophic ventriculomegaly. No new asymmetry is seen concerning for an acute infarct, hemorrhage or mass. There is no midline shift. The basal cisterns are clear. Vascular: There are patchy calcifications of the carotid siphons but no hyperdense central vessels. Skull: No fracture or focal lesions.  Mild calvarial osteopenia.  Sinuses/Orbits: No acute findings. Clear visualized sinuses and mastoid air cells. Negative orbits apart from old lens replacements. Other: None. CT CERVICAL SPINE FINDINGS Alignment: Unchanged. There is a trace 2 mm C3-4 retrolisthesis, similar minimal C4-5 anterolisthesis and trace C5-6 and C6-7 retrolisthesis all believed degenerative and all unchanged. No traumatic listhesis is seen. There is bone-on-bone anterior atlantodental joint space loss with osteophytes, and degenerative cystic changes in the odontoid process. Skull base and vertebrae: No acute fracture is evident. Bone mineralization is osteopenic. No primary bone lesion or focal pathologic process is seen. Soft tissues and spinal canal: No prevertebral fluid or swelling. No visible canal hematoma. There are calcifications in both proximal cervical ICAs, heaviest on the left where there is probably a flow-limiting origin stenosis. No laryngeal or thyroid mass. Disc levels: There is moderate disc space loss again at C3-4, C5-6 and C6-7, mild disc narrowing at C3-4, normal disc heights at C2-3 and C7-T1. There are small bidirectional endplate spurs from C3-4 through C6-7, causing partial effacement of the ventral CSF without spondylotic cord compression. Facet joint spurring on the left-greater-than-right is seen at most levels with bilateral uncinate spurring. Acquired foraminal stenosis is mild on the right at C2-3, severe on the left and moderate on the right at C3-4, bilaterally mild-to-moderate C6-7, and not seen at the remaining levels. Upper chest: Pacemaker wiring is partially visible entering from the left. Otherwise negative. Other: None. IMPRESSION: 1. No acute intracranial CT findings or depressed skull fractures. 2. Osteopenia and degenerative change without evidence of cervical fractures or traumatic listhesis. 3. 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 Follow-up as indicated.  Electronically Signed   By: Almira Bar M.D.   On: 10/30/2022 20:15   CT Angio Chest PE W/Cm &/Or Wo Cm  Result Date: 10/15/2022 CLINICAL DATA:  Chest pain. EXAM: CT ANGIOGRAPHY CHEST WITH CONTRAST TECHNIQUE: Multidetector CT imaging of the chest was performed using the standard protocol during bolus administration of intravenous contrast. Multiplanar CT image reconstructions and MIPs were obtained to evaluate the vascular anatomy. RADIATION DOSE REDUCTION: This exam was performed according to the departmental dose-optimization program which includes automated exposure control, adjustment of the mA and/or kV according to patient size and/or use of iterative reconstruction technique. CONTRAST:  60mL OMNIPAQUE IOHEXOL 350 MG/ML SOLN COMPARISON:  May 11, 2022. FINDINGS: Cardiovascular: Satisfactory opacification of the pulmonary arteries to the segmental level. No evidence of pulmonary embolism. Normal  heart size. No pericardial effusion. Coronary artery calcifications are noted. Mediastinum/Nodes: No enlarged mediastinal, hilar, or axillary lymph nodes. Thyroid gland, trachea, and esophagus demonstrate no significant findings. Lungs/Pleura: No pneumothorax or pleural effusion is noted. Stable 6 mm nodule is noted in left lower lobe best seen on image number 83 of series 5. 1 cm part solid nodule is noted in right upper lobe best seen on image number 58 of series 5, with 4 mm solid nodule. Minimal right posterior basilar subsegmental atelectasis or scarring is noted. Stable 5 mm nodule is noted in right lower lobe best seen on image number 90 of series 5. Upper Abdomen: No acute abnormality. Musculoskeletal: No chest wall abnormality. No acute or significant osseous findings. Review of the MIP images confirms the above findings. IMPRESSION: No definite evidence of pulmonary embolus. Interval development of 1 cm part solid nodule in right upper lobe. Follow-up non-contrast CT recommended at 3-6 months to  confirm persistence. If unchanged, and solid component remains <6 mm, annual CT is recommended until 5 years of stability has been established. If persistent these nodules should be considered highly suspicious if the solid component of the nodule is 6 mm or greater in size and enlarging. This recommendation follows the consensus statement: Guidelines for Management of Incidental Pulmonary Nodules Detected on CT Images: From the Fleischner Society 2017; Radiology 2017; 284:228-243. Stable bilateral lower lobe nodules are noted, the largest measuring 6 mm. Attention to these on follow-up imaging is recommended. Coronary artery calcifications are noted. Aortic Atherosclerosis (ICD10-I70.0) and Emphysema (ICD10-J43.9). Electronically Signed   By: Lupita Raider M.D.   On: 10/15/2022 09:29    TELEMETRY reviewed by me (LT) 11/12/2022 : AF 120s-140s  EKG reviewed by me: AF rate 116   Data reviewed by me (LT) 11/12/2022: hospitalist progress note, nursing notes, last 24h vitals tele labs imaging I/O    Principal Problem:   COPD exacerbation ([name])    ASSESSMENT AND PLAN:  Matthew Zamora. 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 During that admission the patient's defibrillator shocked him for VF, subsequently loaded with amiodarone. He was discharged to a SNF on 5/23. He presented back to [facility] ED on 5/26 with shortness of breath. Cardiology consulted for assistance with his CHF.   # acute on chronic HFrEF  Not hypervolemic on exam but with crackles to auscultation. On baseline O2 Diuresing well with IV lasix.  - continue IV lasix 40mg  BID  - continue GDMT with metoprolol as below. ARB/ARNI & SGLT2i limited with BP and  CKD. Intolerant of spiro.   # COPD with exacerbation  # chronic respiratory failure (2L)  - agree with current therapy per primary team  - on steroids, duonebs  # paroxysmal AF RVR  # hx VT  Rate uncontrolled on tele in the 120s-140s in AF RVR. No evidence of recurrent VT.  - increasing frequency of metoprolol tartrate to 25mg  q6h  - consider short acting PO cardizem if rate remains uncontrolled.  -Continue to hold amiodarone with transaminitis.  Recommend close follow-up with electrophysiology at discharge.  # CKD 3  Improving with diuresis, BUN/Cr33/1.34 and GFR 54 today  # demand ischemia  Borderline elevated and flat trending 21,  23, which is most consistent with demand/supply mismatch and not ACS   This patient's plan of care was discussed and created with Dr. Darrold Junker and he is in agreement.  Signed: Rebeca Allegra , PA-C 11/12/2022, 9:30 AM [facility]

## 2022-11-12 NOTE — Plan of Care (Signed)
  Problem: Education: Goal: Verbalization of understanding the information provided (i.e., activity precautions, restrictions, etc) will improve Outcome: Progressing Goal: Individualized Educational Video(s) Outcome: Progressing   Problem: Activity: Goal: Ability to ambulate and perform ADLs will improve Outcome: Progressing   Problem: Clinical Measurements: Goal: Postoperative complications will be avoided or minimized Outcome: Progressing   Problem: Self-Concept: Goal: Ability to maintain and perform role responsibilities to the fullest extent possible will improve Outcome: Progressing   Problem: Pain Management: Goal: Pain level will decrease Outcome: Progressing   Problem: Education: Goal: Ability to demonstrate management of disease process will improve Outcome: Progressing Goal: Ability to verbalize understanding of medication therapies will improve Outcome: Progressing Goal: Individualized Educational Video(s) Outcome: Progressing   Problem: Activity: Goal: Capacity to carry out activities will improve Outcome: Progressing   Problem: Cardiac: Goal: Ability to achieve and maintain adequate cardiopulmonary perfusion will improve Outcome: Progressing   Problem: Education: Goal: Knowledge of disease or condition will improve Outcome: Progressing Goal: Knowledge of the prescribed therapeutic regimen will improve Outcome: Progressing Goal: Individualized Educational Video(s) Outcome: Progressing   Problem: Activity: Goal: Ability to tolerate increased activity will improve Outcome: Progressing Goal: Will verbalize the importance of balancing activity with adequate rest periods Outcome: Progressing   Problem: Respiratory: Goal: Ability to maintain a clear airway will improve Outcome: Progressing Goal: Levels of oxygenation will improve Outcome: Progressing Goal: Ability to maintain adequate ventilation will improve Outcome: Progressing   Problem:  Education: Goal: Knowledge of disease or condition will improve Outcome: Progressing Goal: Understanding of medication regimen will improve Outcome: Progressing Goal: Individualized Educational Video(s) Outcome: Progressing   Problem: Activity: Goal: Ability to tolerate increased activity will improve Outcome: Progressing   Problem: Cardiac: Goal: Ability to achieve and maintain adequate cardiopulmonary perfusion will improve Outcome: Progressing   Problem: Health Behavior/Discharge Planning: Goal: Ability to safely manage health-related needs after discharge will improve Outcome: Progressing   Problem: Education: Goal: Knowledge of General Education information will improve Description: Including pain rating scale, medication(s)/side effects and non-pharmacologic comfort measures Outcome: Progressing   Problem: Health Behavior/Discharge Planning: Goal: Ability to manage health-related needs will improve Outcome: Progressing   Problem: Clinical Measurements: Goal: Ability to maintain clinical measurements within normal limits will improve Outcome: Progressing Goal: Will remain free from infection Outcome: Progressing Goal: Diagnostic test results will improve Outcome: Progressing Goal: Respiratory complications will improve Outcome: Progressing Goal: Cardiovascular complication will be avoided Outcome: Progressing   Problem: Activity: Goal: Risk for activity intolerance will decrease Outcome: Progressing   Problem: Nutrition: Goal: Adequate nutrition will be maintained Outcome: Progressing   Problem: Coping: Goal: Level of anxiety will decrease Outcome: Progressing   Problem: Elimination: Goal: Will not experience complications related to bowel motility Outcome: Progressing Goal: Will not experience complications related to urinary retention Outcome: Progressing   Problem: Pain Managment: Goal: General experience of comfort will improve Outcome:  Progressing   Problem: Safety: Goal: Ability to remain free from injury will improve Outcome: Progressing   Problem: Skin Integrity: Goal: Risk for impaired skin integrity will decrease Outcome: Progressing

## 2022-11-13 DIAGNOSIS — J441 Chronic obstructive pulmonary disease with (acute) exacerbation: Secondary | ICD-10-CM | POA: Diagnosis not present

## 2022-11-13 LAB — BASIC METABOLIC PANEL
Anion gap: 9 (ref 5–15)
BUN: 41 mg/dL — ABNORMAL HIGH (ref 8–23)
CO2: 32 mmol/L (ref 22–32)
Calcium: 9 mg/dL (ref 8.9–10.3)
Chloride: 94 mmol/L — ABNORMAL LOW (ref 98–111)
Creatinine, Ser: 1.51 mg/dL — ABNORMAL HIGH (ref 0.61–1.24)
GFR, Estimated: 46 mL/min — ABNORMAL LOW (ref >60)
Glucose, Bld: 121 mg/dL — ABNORMAL HIGH (ref 70–99)
Potassium: 4.7 mmol/L (ref 3.5–5.1)
Sodium: 135 mmol/L (ref 135–145)

## 2022-11-13 LAB — CBC
HCT: 27.7 % — ABNORMAL LOW (ref 39.0–52.0)
Hemoglobin: 8.8 g/dL — ABNORMAL LOW (ref 13.0–17.0)
MCH: 29 pg (ref 26.0–34.0)
MCHC: 31.8 g/dL (ref 30.0–36.0)
MCV: 91.4 fL (ref 80.0–100.0)
Platelets: 343 10*3/uL (ref 150–400)
RBC: 3.03 MIL/uL — ABNORMAL LOW (ref 4.22–5.81)
RDW: 14 % (ref 11.5–15.5)
WBC: 13.8 10*3/uL — ABNORMAL HIGH (ref 4.0–10.5)
nRBC: 0 % (ref 0.0–0.2)

## 2022-11-13 LAB — LEGIONELLA PNEUMOPHILA SEROGP 1 UR AG: L. pneumophila Serogp 1 Ur Ag: NEGATIVE

## 2022-11-13 MED ORDER — SERTRALINE HCL 50 MG PO TABS
25.0000 mg | ORAL_TABLET | Freq: Every day | ORAL | Status: DC
  Administered 2022-11-13 – 2022-11-18 (×6): 25 mg via ORAL
  Filled 2022-11-13 (×6): qty 1

## 2022-11-13 MED ORDER — ALBUTEROL SULFATE (2.5 MG/3ML) 0.083% IN NEBU: 3.0000 mL | INHALATION_SOLUTION | RESPIRATORY_TRACT | Status: DC | PRN

## 2022-11-13 MED ORDER — APIXABAN 2.5 MG PO TABS
2.5000 mg | ORAL_TABLET | Freq: Two times a day (BID) | ORAL | Status: DC
  Administered 2022-11-13 – 2022-11-18 (×10): 2.5 mg via ORAL
  Filled 2022-11-13 (×11): qty 1

## 2022-11-13 MED ORDER — ATORVASTATIN CALCIUM 80 MG PO TABS
80.0000 mg | ORAL_TABLET | Freq: Every evening | ORAL | Status: DC
  Administered 2022-11-13 – 2022-11-17 (×5): 80 mg via ORAL
  Filled 2022-11-13 (×5): qty 1

## 2022-11-13 MED ORDER — CALCIUM CARBONATE ANTACID 500 MG PO CHEW
1.0000 | CHEWABLE_TABLET | Freq: Every day | ORAL | Status: DC
  Administered 2022-11-13 – 2022-11-18 (×6): 200 mg via ORAL
  Filled 2022-11-13 (×6): qty 1

## 2022-11-13 MED ORDER — FUROSEMIDE 10 MG/ML IJ SOLN
40.0000 mg | Freq: Every day | INTRAMUSCULAR | Status: DC
  Administered 2022-11-14: 40 mg via INTRAVENOUS
  Filled 2022-11-13: qty 4

## 2022-11-13 MED ORDER — GABAPENTIN 100 MG PO CAPS
100.0000 mg | ORAL_CAPSULE | Freq: Every day | ORAL | Status: DC
  Administered 2022-11-13 – 2022-11-17 (×5): 100 mg via ORAL
  Filled 2022-11-13 (×5): qty 1

## 2022-11-13 MED ORDER — PYRIDOSTIGMINE BROMIDE 60 MG PO TABS
60.0000 mg | ORAL_TABLET | Freq: Three times a day (TID) | ORAL | Status: DC
  Administered 2022-11-13 – 2022-11-18 (×16): 60 mg via ORAL
  Filled 2022-11-13 (×16): qty 1

## 2022-11-13 MED ORDER — VITAMIN C 500 MG PO TABS
500.0000 mg | ORAL_TABLET | Freq: Every day | ORAL | Status: DC
  Administered 2022-11-13 – 2022-11-18 (×6): 500 mg via ORAL
  Filled 2022-11-13 (×6): qty 1

## 2022-11-13 MED ORDER — MOMETASONE FURO-FORMOTEROL FUM 200-5 MCG/ACT IN AERO
2.0000 | INHALATION_SPRAY | Freq: Two times a day (BID) | RESPIRATORY_TRACT | Status: DC
  Administered 2022-11-13 – 2022-11-18 (×10): 2 via RESPIRATORY_TRACT
  Filled 2022-11-13: qty 8.8

## 2022-11-13 MED ORDER — MAGNESIUM OXIDE -MG SUPPLEMENT 400 (240 MG) MG PO TABS
400.0000 mg | ORAL_TABLET | Freq: Every day | ORAL | Status: DC
  Administered 2022-11-13 – 2022-11-18 (×6): 400 mg via ORAL
  Filled 2022-11-13 (×7): qty 1

## 2022-11-13 MED ORDER — HYDROCODONE-ACETAMINOPHEN 5-325 MG PO TABS
1.0000 | ORAL_TABLET | ORAL | Status: DC | PRN
  Administered 2022-11-13: 2 via ORAL
  Administered 2022-11-13: 1 via ORAL
  Administered 2022-11-14: 2 via ORAL
  Administered 2022-11-14: 1 via ORAL
  Administered 2022-11-15 – 2022-11-16 (×3): 2 via ORAL
  Filled 2022-11-13: qty 1
  Filled 2022-11-13: qty 2
  Filled 2022-11-13: qty 1
  Filled 2022-11-13 (×5): qty 2

## 2022-11-13 NOTE — Care Management Important Message (Signed)
Important Message  Patient Details  Name: Matthew Zamora MRN: [id number] Date of Birth: December 12, 1940   Medicare Important Message Given:  N/A - LOS <3 / Initial given by admissions     [name] 11/13/2022, 11:43 AM

## 2022-11-13 NOTE — TOC Initial Note (Signed)
Transition of Care [facility]) - Initial/Assessment Note    Patient Details  Name: Matthew Zamora MRN: [id number] Date of Birth: [birth year]  Transition of Care [facility]) CM/SW Contact:    Truddie Hidden, RN Phone Number: 11/13/2022, 1:31 PM  Clinical Narrative:                 Patient admitted from [facility]. Spoke with [place], in admissions. Auth will be required for patient's return.         Patient Goals and CMS Choice            Expected Discharge Plan and Services                                              Prior Living Arrangements/Services                       Activities of Daily Living Home Assistive Devices/Equipment: Raised toilet seat with rails, Wheelchair, Environmental consultant (specify type), Shower chair with back, Oxygen ADL Screening (condition at time of admission) Patient's cognitive ability adequate to safely complete daily activities?: Yes Is the patient deaf or have difficulty hearing?: Yes Does the patient have difficulty seeing, even when wearing glasses/contacts?: No Does the patient have difficulty concentrating, remembering, or making decisions?: No Patient able to express need for assistance with ADLs?: Yes Does the patient have difficulty dressing or bathing?: Yes Independently performs ADLs?: No Communication: Independent Dressing (OT): Needs assistance Is this a change from baseline?: Pre-admission baseline Grooming: Needs assistance Is this a change from baseline?: Pre-admission baseline Feeding: Independent Bathing: Needs assistance Is this a change from baseline?: Pre-admission baseline Toileting: Needs assistance Is this a change from baseline?: Pre-admission baseline In/Out Bed: Needs assistance Is this a change from baseline?: Pre-admission baseline Walks in Home: Needs assistance Is this a change from baseline?: Pre-admission baseline Does the patient have difficulty walking or climbing stairs?:  Yes Weakness of Legs: Right Weakness of Arms/Hands: None  Permission Sought/Granted                  Emotional Assessment              Admission diagnosis:  Transaminitis [R74.01] COPD exacerbation ([name]) [J44.1] Acute on chronic combined systolic (congestive) and diastolic (congestive) heart failure ([name]) [I50.43] Acute on chronic congestive heart failure, unspecified heart failure type [facility]) [I50.9] Patient Active Problem List   Diagnosis Date Noted   Fall 11/06/2022   Closed displaced fracture of right femoral neck ([name]) 10/31/2022   Osteoporosis 10/30/2022   Femoral fracture ([name]) 10/30/2022   Epistaxis 05/12/2022   Protein-calorie malnutrition, severe 05/08/2022   Enteritis, enteropathogenic E. coli 04/29/2022   Diarrhea 04/28/2022   Multifocal pneumonia 04/23/2022   Erosive esophagitis 04/22/2022   Dizziness 04/21/2022   Intractable nausea and vomiting 04/21/2022   Dyspnea 04/20/2022   Hyponatremia 04/19/2022   Orthostatic hypotension 04/18/2022   Postural dizziness with near syncope 04/18/2022   Near syncope 04/17/2022   COPD (chronic obstructive pulmonary disease) ([name]) 04/17/2022   Hyperkalemia 04/17/2022   Abnormal LFTs 04/17/2022   Chronic systolic CHF (congestive heart failure) ([name]) 04/17/2022   Normocytic anemia 04/17/2022   GERD without esophagitis 02/12/2022   Recurrent falls    Generalized weakness    NSVT (nonsustained ventricular tachycardia) ([name]) 02/11/2022   Acute on chronic combined systolic (congestive)  and diastolic (congestive) heart failure ([name]) 08/10/2021   Acute respiratory failure with hypoxia ([name])    HCAP (healthcare-associated pneumonia) 06/13/2021   Acute on chronic systolic CHF (congestive heart failure) ([name]) 06/13/2021   Elevated troponin 06/13/2021   CKD (chronic kidney disease), 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   Lung nodule 06/13/2021   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   COPD exacerbation ([name]) 06/13/2021   Volume depletion 12/15/2019    Hypotension 12/15/2019   Cardiorenal syndrome    Atrial fibrillation with RVR ([name])    Defibrillator discharge    Tachycardia 10/25/2019   CHF (congestive heart failure) ([name]) 09/18/2019   Acute on chronic combined systolic and diastolic CHF (congestive heart failure) ([name]) 09/17/2019   CAD (coronary artery disease) 09/17/2019   Ischemic cardiomyopathy 09/17/2019   CKD (chronic kidney disease) stage 3, GFR 30-59 ml/min ([name]) 09/17/2019   Acute hypoxemic respiratory failure ([name]) 09/17/2018   AAA (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 ([name]) 07/06/2018   Essential hypertension 07/06/2018   Hyperlipidemia 07/06/2018   Paroxysmal atrial fibrillation ([name])    Acute on chronic respiratory failure with hypoxia ([name]) 04/26/2018   PCP:  Jerl Mina, MD Pharmacy:   [facility] [address]), [place] - [address] Jerilynn Mages [place]) [place] [address] Phone: [phone] Fax: [phone]     Social Determinants of Health (SDOH) Social History: SDOH Screenings   Food Insecurity: No Food Insecurity (11/10/2022)  Housing: Low Risk  (11/10/2022)  Transportation Needs: No Transportation Needs (11/10/2022)  Utilities: Not At Risk (11/10/2022)  Tobacco Use: Medium Risk (11/10/2022)   SDOH Interventions:     Readmission Risk Interventions    04/22/2022    3:54 PM  Readmission Risk Prevention Plan  Transportation Screening Complete  Medication Review (RN Care Manager) Complete  PCP or Specialist appointment within 3-5 days of discharge Complete  HRI or Home Care Consult Complete  SW Recovery Care/Counseling Consult Complete  Palliative Care Screening Not Applicable  Skilled Nursing Facility Not Applicable

## 2022-11-13 NOTE — Progress Notes (Signed)
[facility] CARDIOLOGY CONSULT NOTE       Patient ID: Matthew Zamora MRN: [id number] DOB/AGE: 1940/10/12 82 y.o.  Admit date: 11/10/2022 Referring Physician Dr. Skip Mayer Primary Physician Dr. Jerl Mina Primary Cardiologist Derwood Kaplan, NP [facility])  Reason for Consultation CHF  HPI: Bryian Richman. 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 During that admission the patient's defibrillator shocked him for VF, subsequently loaded with amiodarone. He was discharged to a SNF on 5/23. He presented back to [facility] ED on 5/26 with shortness of breath. Cardiology consulted for assistance with his CHF.   Interval History:  -Feels okay today -Some pleuritic chest discomfort exclusively when coughing.  Some bleeding from his left nare, likely secondary to dryness from the nasal cannula oxygen. -Feels a little short of breath with movement in bed, still on his baseline level of oxygen. 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 pylori infection    History of hepatitis    Hyperlipidemia    Hypertension    Liver cyst 05/16/15   PAF (paroxysmal atrial fibrillation) ([name])     Prostate CA ([name])     Past Surgical History:  Procedure Laterality Date   CATARACT EXTRACTION     COLONOSCOPY  10/06/2014, 09/18/2004, 03/14/2009   ESOPHAGOGASTRODUODENOSCOPY  10/06/2014, 03/18/2001, 03/14/2009   ESOPHAGOGASTRODUODENOSCOPY (EGD) WITH PROPOFOL N/A 05/07/2018   Procedure: ESOPHAGOGASTRODUODENOSCOPY (EGD) WITH PROPOFOL;  Surgeon: Toledo, Boykin Nearing, MD;  Location: [facility] ENDOSCOPY;  Service: Gastroenterology;  Laterality: N/A;   ESOPHAGOGASTRODUODENOSCOPY (EGD) WITH PROPOFOL N/A 04/22/2022   Procedure: ESOPHAGOGASTRODUODENOSCOPY (EGD) WITH PROPOFOL;  Surgeon: Toney Reil, MD;  Location: [facility] ENDOSCOPY;  Service: Endoscopy;  Laterality: N/A;   FLEXIBLE SIGMOIDOSCOPY  08/26/1990   HIP ARTHROPLASTY Right 10/31/2022   Procedure: ARTHROPLASTY BIPOLAR HIP (HEMIARTHROPLASTY)-RNFA;  Surgeon: Reinaldo Berber, MD;  Location: [facility] ORS;  Service: Orthopedics;  Laterality: Right;   INSERTION OF ICD     PROSTATE SURGERY     TONSILLECTOMY      Medications Prior to Admission  Medication Sig Dispense Refill Last Dose   amiodarone (PACERONE) 400 MG tablet Take 1 tablet (400 mg total) by mouth 2 (two) times daily for 10 days. Followed by 400mg  daily 20 tablet 0 11/09/2022 at 2245   ascorbic acid (VITAMIN C) 500 MG tablet Take 1 tablet by mouth daily.   11/09/2022 at 0947   atorvastatin (LIPITOR) 80 MG tablet Take 80 mg by mouth daily.   11/09/2022 at 1731   calcium carbonate (TUMS EX) 750 MG chewable tablet Chew 1 tablet by mouth daily.   11/09/2022 at 0947   ELIQUIS 2.5 MG TABS tablet Take 2.5 mg  by mouth 2 (two) times daily.   11/09/2022 at 2245   fluticasone-salmeterol (ADVAIR) 500-50 MCG/ACT AEPB Inhale 1 puff into the lungs in the morning and at bedtime.   11/10/2022 at 1126   gabapentin (NEURONTIN) 100 MG capsule Take 100 mg by mouth at bedtime.   11/09/2022 at 2245   HYDROcodone-acetaminophen (NORCO/VICODIN) 5-325 MG tablet Take 1-2 tablets by mouth every 4 (four) hours as needed for moderate  pain (pain score 4-6). 30 tablet 0 11/10/2022 at 1223   magnesium oxide (MAG-OX) 400 (240 Mg) MG tablet Take 1 tablet by mouth daily.   11/09/2022 at 0947   melatonin 3 MG TABS tablet Take 9 mg by mouth at bedtime.   11/09/2022 at 2245   metoprolol tartrate (LOPRESSOR) 25 MG tablet Take 0.5 tablets (12.5 mg total) by mouth 2 (two) times daily.   11/09/2022 at 2245   midodrine (PROAMATINE) 5 MG tablet Take 3 tablets (15 mg total) by mouth 3 (three) times daily with meals.   11/09/2022 at 2245   Multiple Vitamin (MULTIVITAMIN WITH MINERALS) TABS tablet Take 1 tablet by mouth daily.   11/09/2022 at 0947   pantoprazole (PROTONIX) 40 MG tablet Take 1 tablet (40 mg total) by mouth 2 (two) times daily.   11/09/2022 at 1647   polyethylene glycol (MIRALAX / GLYCOLAX) 17 g packet Take 17 g by mouth 2 (two) times daily. 14 each 0 11/09/2022 at 1647   pyridostigmine (MESTINON) 60 MG tablet Take 1 tablet (60 mg total) by mouth every 8 (eight) hours.   11/10/2022 at 0637   sertraline (ZOLOFT) 25 MG tablet Take 25 mg by mouth daily.   11/09/2022 at 0947   sucralfate (CARAFATE) 1 GM/10ML suspension Take 10 mLs (1 g total) by mouth 4 (four) times daily -  with meals and at bedtime. 420 mL 0 11/09/2022 at 2245   tamsulosin (FLOMAX) 0.4 MG CAPS capsule Take 0.4 mg by mouth daily.   11/09/2022 at 0947   acetaminophen (TYLENOL) 325 MG tablet Take 2 tablets (650 mg total) by mouth every 4 (four) hours as needed for headache or mild pain.   11/08/2022 at 1401   albuterol (PROVENTIL) (2.5 MG/3ML) 0.083% nebulizer solution Inhale 3 mLs (2.5 mg total) into the lungs as needed for shortness of breath. 75 mL 12 PRN at PRN   albuterol (VENTOLIN HFA) 108 (90 Base) MCG/ACT inhaler Inhale 2 puffs into the lungs every 4 (four) hours as needed.   11/08/2022 at 1401   [START ON 11/17/2022] amiodarone (PACERONE) 400 MG tablet Take 1 tablet (400 mg total) by mouth daily. To be started after completing 10 days of the twice daily dosing 30 tablet 0     metoCLOPramide (REGLAN) 5 MG tablet Take 1 tablet (5 mg total) by mouth every 8 (eight) hours as needed for refractory nausea / vomiting.   PRN at PRN   Nebulizer MISC 1 each by Does not apply route as needed. 1 each 0    Nutritional Supplements (,FEEDING SUPPLEMENT, PROSOURCE PLUS) liquid Take 30 mLs by mouth 3 (three) times daily between meals.   11/08/2022 at 1125   ondansetron (ZOFRAN-ODT) 4 MG disintegrating tablet Take 4 mg by mouth every 8 (eight) hours as needed for refractory nausea / vomiting, vomiting or nausea.   PRN at PRN   Social History   Socioeconomic History   Marital status: Single    Spouse name: Not on file   Number of children: Not on file   Years of  education: Not on file   Highest education level: Not on file  Occupational History   Not on file  Tobacco Use   Smoking status: Former    Packs/day: 1.00    Years: 38.00    Additional pack years: 0.00    Total pack years: 38.00    Types: Cigarettes    Quit date: 06/18/1999    Years since quitting: 23.4   Smokeless tobacco: Never  Vaping Use   Vaping Use: Never used  Substance and Sexual Activity   Alcohol use: No    Alcohol/week: 0.0 standard drinks of alcohol   Drug use: Not Currently   Sexual activity: Not Currently  Other Topics Concern   Not on file  Social History Narrative   Not on file   Social Determinants of Health   Financial Resource Strain: Not on file  Food Insecurity: No Food Insecurity (11/10/2022)   Hunger Vital Sign    Worried About Running Out of Food in the Last Year: Never true    Ran Out of Food in the Last Year: Never true  Transportation Needs: No Transportation Needs (11/10/2022)   PRAPARE - Administrator, Civil Service (Medical): No    Lack of Transportation (Non-Medical): No  Physical Activity: Not on file  Stress: Not on file  Social Connections: Not on file  Intimate Partner Violence: Not At Risk (11/10/2022)   Humiliation, Afraid, Rape, and Kick questionnaire     Fear of Current or Ex-Partner: No    Emotionally Abused: No    Physically Abused: No    Sexually Abused: No    Family History  Problem Relation Age of Onset   Heart attack Mother    Heart attack Father       Intake/Output Summary (Last 24 hours) at 11/13/2022 0944 Last data filed at 11/13/2022 0412 Gross per 24 hour  Intake 840 ml  Output 2500 ml  Net -1660 ml     Vitals:   11/13/22 0403 11/13/22 0414 11/13/22 0544 11/13/22 0800  BP:   116/75   Pulse:   (!) 108   Resp:      Temp: 97.7 F (36.5 C) 97.7 F (36.5 C)  98 F (36.7 C)  TempSrc:    Oral  SpO2:   99%   Weight: 65.6 kg     Height:        PHYSICAL EXAM General: elderly male , well nourished, in no acute distress. Sitting at incline in bed HEENT:  Normocephalic and atraumatic. Neck:  No JVD.  Lungs: Normal respiratory effort on 2L [place].  Trace inspiratory wheezes with better air movement compared to yesterday, no appreciable crackles Heart: tachy irregularly irregular. Normal S1 and S2 without gallops or murmurs.  Abdomen: Non-distended appearing.  Msk: Normal strength and tone for age. Extremities: Warm and well perfused. No clubbing, cyanosis.  No peripheral edema.  Neuro: Alert and oriented X 3. Psych:  Answers questions appropriately.   Labs: Basic Metabolic Panel: Recent Labs    11/10/22 1246 11/11/22 0539 11/12/22 0529 11/13/22 0536  NA 137   < > 135 135  K 3.6   < > 4.4 4.7  CL 101   < > 98 94*  CO2 29   < > 26 32  GLUCOSE 123*   < > 153* 121*  BUN 17   < > 33* 41*  CREATININE 1.17   < > 1.34* 1.51*  CALCIUM 8.9   < > 8.9 9.0  MG  1.7  --  2.1  --   PHOS 3.3  --   --   --    < > = values in this interval not displayed.    Liver Function Tests: Recent Labs    11/11/22 0539 11/12/22 0529  AST 391* 264*  ALT 222* 249*  ALKPHOS 73 72  BILITOT 0.9 0.6  PROT 5.8* 6.0*  ALBUMIN 2.8* 2.9*    No results for input(s): "LIPASE", "AMYLASE" in the last 72 hours. CBC: Recent Labs     11/12/22 0529 11/13/22 0536  WBC 18.1* 13.8*  HGB 9.2* 8.8*  HCT 28.5* 27.7*  MCV 90.5 91.4  PLT 335 343    Cardiac Enzymes: Recent Labs    11/10/22 1246 11/11/22 1633 11/11/22 1850  TROPONINIHS 19* 21* 23*    BNP: Recent Labs    11/11/22 0810  BNP 1,044.6*    D-Dimer: No results for input(s): "DDIMER" in the last 72 hours. Hemoglobin A1C: No results for input(s): "HGBA1C" in the last 72 hours. Fasting Lipid Panel: No results for input(s): "CHOL", "HDL", "LDLCALC", "TRIG", "CHOLHDL", "LDLDIRECT" in the last 72 hours. Thyroid Function Tests: No results for input(s): "TSH", "T4TOTAL", "T3FREE", "THYROIDAB" in the last 72 hours.  Invalid input(s): "FREET3" Anemia Panel: No results for input(s): "VITAMINB12", "FOLATE", "FERRITIN", "TIBC", "IRON", "RETICCTPCT" in the last 72 hours.   Radiology: ECHOCARDIOGRAM COMPLETE  Result Date: 11/10/2022    ECHOCARDIOGRAM REPORT   Patient Name:   DRAVIN SCHELLIN Date of Exam: 11/10/2022 Medical Rec #:  [id number]           Height:       68.0 in Accession #:    [id number]          Weight:       153.0 lb Date of Birth:  [birth year]           BSA:          1.824 m Patient Age:    81 years            BP:           126/82 mmHg Patient Gender: M                   HR:           121 bpm. Exam Location:  [facility] Procedure: 2D Echo Indications:     CHF I50.21  History:         Patient has prior history of Echocardiogram examinations, most                  recent 04/21/2022.  Sonographer:     Overton Mam RDCS, FASE Referring Phys:  [id number] Beulah Gandy A THOMAS Diagnosing Phys: Julien Nordmann MD  Sonographer Comments: Technically difficult study due to poor echo windows and suboptimal parasternal window. Image acquisition challenging due to respiratory motion. IMPRESSIONS  1. Left ventricular ejection fraction, by estimation, is 30 to 35%. Left ventricular ejection fraction by PLAX is 34 %. The left ventricle has moderately decreased function. The left  ventricle demonstrates global hypokinesis. The left ventricular internal cavity size was mildly dilated. Left ventricular diastolic parameters are indeterminate.  2. Right ventricular systolic function is normal. The right ventricular size is normal. There is moderately elevated pulmonary artery systolic pressure. The estimated right ventricular systolic pressure is 51.2 mmHg.  3. The mitral valve is normal in structure. Mild mitral valve regurgitation. No evidence of mitral stenosis.  4. The aortic valve is tricuspid.  Aortic valve regurgitation is not visualized. Aortic valve sclerosis is present, with no evidence of aortic valve stenosis.  5. The inferior vena cava is normal in size with <50% respiratory variability, suggesting right atrial pressure of 8 mmHg.  6. Rhythm is atrial fibrillation rate 102 to 130 bpm FINDINGS  Left Ventricle: Left ventricular ejection fraction, by estimation, is 30 to 35%. Left ventricular ejection fraction by PLAX is 34 %. The left ventricle has moderately decreased function. The left ventricle demonstrates global hypokinesis. The left ventricular internal cavity size was mildly dilated. There is no left ventricular hypertrophy. Left ventricular diastolic parameters are indeterminate. Right Ventricle: The right ventricular size is normal. No increase in right ventricular wall thickness. Right ventricular systolic function is normal. There is moderately elevated pulmonary artery systolic pressure. The tricuspid regurgitant velocity is 3.40 m/s, and with an assumed right atrial pressure of 5 mmHg, the estimated right ventricular systolic pressure is 51.2 mmHg. Left Atrium: Left atrial size was normal in size. Right Atrium: Right atrial size was normal in size. Pericardium: There is no evidence of pericardial effusion. Mitral Valve: The mitral valve is normal in structure. Mild mitral valve regurgitation. No evidence of mitral valve stenosis. Tricuspid Valve: The tricuspid valve is  normal in structure. Tricuspid valve regurgitation is mild . No evidence of tricuspid stenosis. Aortic Valve: The aortic valve is tricuspid. Aortic valve regurgitation is not visualized. Aortic valve sclerosis is present, with no evidence of aortic valve stenosis. Aortic valve peak gradient measures 10.8 mmHg. Pulmonic Valve: The pulmonic valve was normal in structure. Pulmonic valve regurgitation is not visualized. No evidence of pulmonic stenosis. Aorta: The aortic root is normal in size and structure. Venous: The inferior vena cava is normal in size with less than 50% respiratory variability, suggesting right atrial pressure of 8 mmHg. IAS/Shunts: No atrial level shunt detected by color flow Doppler. Additional Comments: A device lead is visualized.  LEFT VENTRICLE PLAX 2D LV EF:         Left            Diastology                ventricular     LV e' medial:    10.30 cm/s                ejection        LV E/e' medial:  11.2                fraction by     LV e' lateral:   16.20 cm/s                PLAX is 34      LV E/e' lateral: 7.1                %. LVIDd:         5.00 cm LVIDs:         4.20 cm LV PW:         1.10 cm LV IVS:        1.20 cm LVOT diam:     1.80 cm LV SV:         36 LV SV Index:   20 LVOT Area:     2.54 cm  LV Volumes (MOD) LV vol d, MOD    102.0 ml A4C: LV vol s, MOD    56.2 ml A4C: LV SV MOD A4C:   102.0 ml RIGHT VENTRICLE RV Basal diam:  2.70 cm RV S prime:     13.50 cm/s TAPSE (M-mode): 1.5 cm LEFT ATRIUM           Index        RIGHT ATRIUM           Index LA diam:      4.20 cm 2.30 cm/m   RA Area:     17.10 cm LA Vol (A2C): 44.0 ml 24.13 ml/m  RA Volume:   46.10 ml  25.28 ml/m LA Vol (A4C): 55.6 ml 30.49 ml/m  AORTIC VALVE                 PULMONIC VALVE AV Area (Vmax): 1.51 cm     RVOT Peak grad: 3 mmHg AV Vmax:        164.00 cm/s AV Peak Grad:   10.8 mmHg LVOT Vmax:      97.40 cm/s LVOT Vmean:     61.300 cm/s LVOT VTI:       0.141 m  AORTA Ao Root diam: 3.50 cm MITRAL VALVE                 TRICUSPID VALVE MV Area (PHT): 3.21 cm     TR Peak grad:   46.2 mmHg MV Decel Time: 236 msec     TR Vmax:        340.00 cm/s MV E velocity: 115.00 cm/s                             SHUNTS                             Systemic VTI:  0.14 m                             Systemic Diam: 1.80 cm Julien Nordmann MD Electronically signed by Julien Nordmann MD Signature Date/Time: 11/10/2022/6:47:31 PM    Final    CT CHEST WO CONTRAST  Result Date: 11/10/2022 CLINICAL DATA:  Chest pain and shortness of breath. EXAM: CT CHEST WITHOUT CONTRAST TECHNIQUE: Multidetector CT imaging of the chest was performed following the standard protocol without IV contrast. RADIATION DOSE REDUCTION: This exam was performed according to the departmental dose-optimization program which includes automated exposure control, adjustment of the mA and/or kV according to patient size and/or use of iterative reconstruction technique. COMPARISON:  Chest x-ray from same day. CT chest dated October 15, 2022. FINDINGS: Cardiovascular: Unchanged left chest wall pacemaker. Unchanged mild cardiomegaly. No pericardial effusion. No thoracic aortic aneurysm. Coronary, aortic arch, and branch vessel atherosclerotic vascular disease. Mediastinum/Nodes: Enlarged right paratracheal lymph node currently measuring 1.5 cm in short axis, previously 1.1 cm. Additional subcentimeter mediastinal and right hilar lymph nodes are unchanged. No enlarged axillary lymph nodes. The thyroid gland, trachea, and esophagus demonstrate no significant findings. Lungs/Pleura: Small bilateral pleural effusions. Scattered mild smooth interlobular septal thickening. New scattered ill-defined ground-glass densities and denser nodular opacities in both upper lobes, the right middle lobe, and the anterior left lower lobe. Few new small nodules in the superior segment of the right lower lobe. Subsegmental atelectasis in both posterior lower lobes. No pneumothorax. Unchanged 6 mm nodule left  lower lobe (series 3, image 83). Unchanged 5 mm nodule in the right lower lobe (series 3, image 89). The previously seen ground-glass nodule in the right upper lobe is no  longer identified. Mild centrilobular emphysema. Upper Abdomen: No acute abnormality. Unchanged tiny gallstone. Partially visualized complex cyst in the inferior right liver, as seen on prior studies. Musculoskeletal: No acute or significant osseous findings. IMPRESSION: 1. New scattered ill-defined ground-glass densities and denser nodular opacities in both upper lobes, the right middle lobe, and the anterior left lower lobe, concerning for multifocal pneumonia. 2. Small bilateral pleural effusions with mild interstitial pulmonary edema. 3. Enlarged right paratracheal lymph node currently measuring 1.5 cm in short axis, previously 1.1 cm, likely reactive. 4. Previously seen ground-glass nodule in the right upper lobe is no longer identified. Other previously identified small pulmonary nodules in both lower lobes are unchanged. 5. Aortic Atherosclerosis (ICD10-I70.0) and Emphysema (ICD10-J43.9). Electronically Signed   By: Obie Dredge M.D.   On: 11/10/2022 14:00   DG Chest 2 View  Result Date: 11/10/2022 CLINICAL DATA:  Shortness of breath. EXAM: CHEST - 2 VIEW COMPARISON:  10/30/22 FINDINGS: Left chest wall ICD noted with leads in the right atrial appendage and right ventricle. Heart size is normal. Aortic atherosclerosis. Small bilateral posterior layering pleural effusions are identified on the lateral projection radiograph. Increased pulmonary vascularity. No frank edema. No airspace consolidation. IMPRESSION: 1. Small bilateral pleural effusions and increased pulmonary vascularity concerning for early CHF. Electronically Signed   By: Signa Kell M.D.   On: 11/10/2022 10:04   DG Chest Port 1 View  Result Date: 11/04/2022 CLINICAL DATA:  Cough EXAM: PORTABLE CHEST 1 VIEW COMPARISON:  X-ray 10/30/2022 FINDINGS: Underinflation  compared to the prior x-ray. Stable cardiopericardial silhouette with calcified aorta. Left upper chest defibrillator with leads overlying the right side of the heart. Vascular congestion. No pneumothorax or effusion. Question trace edema. No separate consolidation IMPRESSION: Vascular congestion with question trace edema. Defibrillator. Decreased inflation compared to prior x-ray.  Recommend follow-up Electronically Signed   By: Karen Kays M.D.   On: 11/04/2022 10:38   DG Pelvis Portable  Result Date: 10/31/2022 CLINICAL DATA:  Bipolar right hip hemiarthroplasty. EXAM: PORTABLE PELVIS 1-2 VIEWS COMPARISON:  CT right hip 11/02/2022 FINDINGS: Single frontal view of the bilateral hips. Interval bipolar right hip hemiarthroplasty with normal alignment. Expected postoperative subcutaneous air about the right hip. Mild superomedial left femoroacetabular joint space narrowing. Surgical clips overlie the pelvis. No acute fracture or dislocation. IMPRESSION: Interval bipolar right hip hemiarthroplasty with normal alignment. Electronically Signed   By: Neita Garnet M.D.   On: 10/31/2022 16:23   CT Hip Right Wo Contrast  Result Date: 10/30/2022 CLINICAL DATA:  Right femoral fracture EXAM: CT OF THE RIGHT HIP WITHOUT CONTRAST TECHNIQUE: Multidetector CT imaging of the right hip was performed according to the standard protocol. Multiplanar CT image reconstructions were also generated. RADIATION DOSE REDUCTION: This exam was performed according to the departmental dose-optimization program which includes automated exposure control, adjustment of the mA and/or kV according to patient size and/or use of iterative reconstruction technique. COMPARISON:  Right hips and pelvis radiograph dated 10/30/2022 FINDINGS: Bones/Joint/Cartilage Acute transcervical fracture of the right proximal femur with apex anterior angulation. The right femoroacetabular joint is intact. Ligaments Suboptimally assessed by CT. Muscles and Tendons  Grossly intact. Soft tissues Vascular calcifications of the partially imaged right lower extremity arteries. IMPRESSION: Mildly angulated right femoral transcervical fracture. Electronically Signed   By: Agustin Cree M.D.   On: 10/30/2022 20:49   DG Knee 2 Views Right  Result Date: 10/30/2022 CLINICAL DATA:  Right knee pain, fall EXAM: RIGHT KNEE - 1-2 VIEW COMPARISON:  None  Available. FINDINGS: Moderate tricompartment degenerative changes with joint space narrowing and spurring. No joint effusion. No acute bony abnormality. Specifically, no fracture, subluxation, or dislocation. IMPRESSION: Moderate degenerative changes.  No acute bony abnormality. Electronically Signed   By: Charlett Nose M.D.   On: 10/30/2022 20:25   DG Hip Unilat W or Wo Pelvis 2-3 Views Right  Result Date: 10/30/2022 CLINICAL DATA:  Fall, right hip pain EXAM: DG HIP (WITH OR WITHOUT PELVIS) 2-3V RIGHT COMPARISON:  None Available. FINDINGS: There is foreshortening of the right femoral neck with cortical irregularity noted laterally concerning for right femoral neck fracture. No subluxation or dislocation. Mild symmetric degenerative changes in the hips with joint space narrowing and spurring. IMPRESSION: Foreshortening of the right femoral neck with cortical irregularity laterally concerning for femoral neck fracture. Electronically Signed   By: Charlett Nose M.D.   On: 10/30/2022 20:24   DG Chest Portable 1 View  Result Date: 10/30/2022 CLINICAL DATA:  Fall, right hip pain EXAM: PORTABLE CHEST 1 VIEW COMPARISON:  09/14/2022 FINDINGS: Left AICD remains in place, unchanged. Heart is normal size. Mediastinal contours within normal limits. No confluent opacities or effusions. No acute bony abnormality. IMPRESSION: No active disease. Electronically Signed   By: Charlett Nose M.D.   On: 10/30/2022 20:22   CT HEAD WO CONTRAST ( )  Result Date: 10/30/2022 CLINICAL DATA:  Trip and fall injury. EXAM: CT HEAD WITHOUT CONTRAST CT CERVICAL SPINE  WITHOUT CONTRAST TECHNIQUE: Multidetector CT imaging of the head and cervical spine was performed following the standard protocol without intravenous contrast. Multiplanar CT image reconstructions of the cervical spine were also generated. RADIATION DOSE REDUCTION: This exam was performed according to the departmental dose-optimization program which includes automated exposure control, adjustment of the mA and/or kV according to patient size and/or use of iterative reconstruction technique. COMPARISON:  Head CT without contrast 04/17/2022, cervical spine CT 03/27/2022. FINDINGS: CT HEAD FINDINGS Brain: There is mild atrophy, small-vessel disease and atrophic ventriculomegaly. No new asymmetry is seen concerning for an acute infarct, hemorrhage or mass. There is no midline shift. The basal cisterns are clear. Vascular: There are patchy calcifications of the carotid siphons but no hyperdense central vessels. Skull: No fracture or focal lesions.  Mild calvarial osteopenia. Sinuses/Orbits: No acute findings. Clear visualized sinuses and mastoid air cells. Negative orbits apart from old lens replacements. Other: None. CT CERVICAL SPINE FINDINGS Alignment: Unchanged. There is a trace 2 mm C3-4 retrolisthesis, similar minimal C4-5 anterolisthesis and trace C5-6 and C6-7 retrolisthesis all believed degenerative and all unchanged. No traumatic listhesis is seen. There is bone-on-bone anterior atlantodental joint space loss with osteophytes, and degenerative cystic changes in the odontoid process. Skull base and vertebrae: No acute fracture is evident. Bone mineralization is osteopenic. No primary bone lesion or focal pathologic process is seen. Soft tissues and spinal canal: No prevertebral fluid or swelling. No visible canal hematoma. There are calcifications in both proximal cervical ICAs, heaviest on the left where there is probably a flow-limiting origin stenosis. No laryngeal or thyroid mass. Disc levels: There is  moderate disc space loss again at C3-4, C5-6 and C6-7, mild disc narrowing at C3-4, normal disc heights at C2-3 and C7-T1. There are small bidirectional endplate spurs from C3-4 through C6-7, causing partial effacement of the ventral CSF without spondylotic cord compression. Facet joint spurring on the left-greater-than-right is seen at most levels with bilateral uncinate spurring. Acquired foraminal stenosis is mild on the right at C2-3, severe on the left and moderate on  the right at C3-4, bilaterally mild-to-moderate C6-7, and not seen at the remaining levels. Upper chest: Pacemaker wiring is partially visible entering from the left. Otherwise negative. Other: None. IMPRESSION: 1. No acute intracranial CT findings or depressed skull fractures. 2. Osteopenia and degenerative change without evidence of cervical fractures or traumatic listhesis. 3. 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 Follow-up as indicated. Electronically Signed   By: Almira Bar M.D.   On: 10/30/2022 20:15   CT Cervical Spine Wo Contrast  Result Date: 10/30/2022 CLINICAL DATA:  Trip and fall injury. EXAM: CT HEAD WITHOUT CONTRAST CT CERVICAL SPINE WITHOUT CONTRAST TECHNIQUE: Multidetector CT imaging of the head and cervical spine was performed following the standard protocol without intravenous contrast. Multiplanar CT image reconstructions of the cervical spine were also generated. RADIATION DOSE REDUCTION: This exam was performed according to the departmental dose-optimization program which includes automated exposure control, adjustment of the mA and/or kV according to patient size and/or use of iterative reconstruction technique. COMPARISON:  Head CT without contrast 04/17/2022, cervical spine CT 03/27/2022. FINDINGS: CT HEAD FINDINGS Brain: There is mild atrophy, small-vessel disease and atrophic ventriculomegaly. No new asymmetry is seen concerning for an acute infarct,  hemorrhage or mass. There is no midline shift. The basal cisterns are clear. Vascular: There are patchy calcifications of the carotid siphons but no hyperdense central vessels. Skull: No fracture or focal lesions.  Mild calvarial osteopenia. Sinuses/Orbits: No acute findings. Clear visualized sinuses and mastoid air cells. Negative orbits apart from old lens replacements. Other: None. CT CERVICAL SPINE FINDINGS Alignment: Unchanged. There is a trace 2 mm C3-4 retrolisthesis, similar minimal C4-5 anterolisthesis and trace C5-6 and C6-7 retrolisthesis all believed degenerative and all unchanged. No traumatic listhesis is seen. There is bone-on-bone anterior atlantodental joint space loss with osteophytes, and degenerative cystic changes in the odontoid process. Skull base and vertebrae: No acute fracture is evident. Bone mineralization is osteopenic. No primary bone lesion or focal pathologic process is seen. Soft tissues and spinal canal: No prevertebral fluid or swelling. No visible canal hematoma. There are calcifications in both proximal cervical ICAs, heaviest on the left where there is probably a flow-limiting origin stenosis. No laryngeal or thyroid mass. Disc levels: There is moderate disc space loss again at C3-4, C5-6 and C6-7, mild disc narrowing at C3-4, normal disc heights at C2-3 and C7-T1. There are small bidirectional endplate spurs from C3-4 through C6-7, causing partial effacement of the ventral CSF without spondylotic cord compression. Facet joint spurring on the left-greater-than-right is seen at most levels with bilateral uncinate spurring. Acquired foraminal stenosis is mild on the right at C2-3, severe on the left and moderate on the right at C3-4, bilaterally mild-to-moderate C6-7, and not seen at the remaining levels. Upper chest: Pacemaker wiring is partially visible entering from the left. Otherwise negative. Other: None. IMPRESSION: 1. No acute intracranial CT findings or depressed skull  fractures. 2. Osteopenia and degenerative change without evidence of cervical fractures or traumatic listhesis. 3. 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 Follow-up as indicated. Electronically Signed   By: Almira Bar M.D.   On: 10/30/2022 20:15   CT Angio Chest PE W/Cm &/Or Wo Cm  Result Date: 10/15/2022 CLINICAL DATA:  Chest pain. EXAM: CT ANGIOGRAPHY CHEST WITH CONTRAST TECHNIQUE: Multidetector CT imaging of the chest was performed using the standard protocol during bolus administration of intravenous contrast. Multiplanar CT image reconstructions and MIPs were obtained to evaluate  the vascular anatomy. RADIATION DOSE REDUCTION: This exam was performed according to the departmental dose-optimization program which includes automated exposure control, adjustment of the mA and/or kV according to patient size and/or use of iterative reconstruction technique. CONTRAST:  60mL OMNIPAQUE IOHEXOL 350 MG/ML SOLN COMPARISON:  May 11, 2022. FINDINGS: Cardiovascular: Satisfactory opacification of the pulmonary arteries to the segmental level. No evidence of pulmonary embolism. Normal heart size. No pericardial effusion. Coronary artery calcifications are noted. Mediastinum/Nodes: No enlarged mediastinal, hilar, or axillary lymph nodes. Thyroid gland, trachea, and esophagus demonstrate no significant findings. Lungs/Pleura: No pneumothorax or pleural effusion is noted. Stable 6 mm nodule is noted in left lower lobe best seen on image number 83 of series 5. 1 cm part solid nodule is noted in right upper lobe best seen on image number 58 of series 5, with 4 mm solid nodule. Minimal right posterior basilar subsegmental atelectasis or scarring is noted. Stable 5 mm nodule is noted in right lower lobe best seen on image number 90 of series 5. Upper Abdomen: No acute abnormality. Musculoskeletal: No chest wall abnormality. No acute or significant osseous findings.  Review of the MIP images confirms the above findings. IMPRESSION: No definite evidence of pulmonary embolus. Interval development of 1 cm part solid nodule in right upper lobe. Follow-up non-contrast CT recommended at 3-6 months to confirm persistence. If unchanged, and solid component remains <6 mm, annual CT is recommended until 5 years of stability has been established. If persistent these nodules should be considered highly suspicious if the solid component of the nodule is 6 mm or greater in size and enlarging. This recommendation follows the consensus statement: Guidelines for Management of Incidental Pulmonary Nodules Detected on CT Images: From the Fleischner Society 2017; Radiology 2017; 284:228-243. Stable bilateral lower lobe nodules are noted, the largest measuring 6 mm. Attention to these on follow-up imaging is recommended. Coronary artery calcifications are noted. Aortic Atherosclerosis (ICD10-I70.0) and Emphysema (ICD10-J43.9). Electronically Signed   By: Lupita Raider M.D.   On: 10/15/2022 09:29    TELEMETRY reviewed by me (LT) 11/13/2022 : AF 109-low 110s  EKG reviewed by me: AF rate 116   Data reviewed by me (LT) 11/13/2022: hospitalist progress note, nursing notes, last 24h vitals tele labs imaging I/O    Principal Problem:   COPD exacerbation ([name])    ASSESSMENT AND PLAN:  Viona Gilmore. 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 During that admission the patient's defibrillator shocked him for VF, subsequently loaded with amiodarone. He was discharged to a SNF on 5/23. He presented back to [facility] ED on 5/26 with shortness of breath. Cardiology consulted for assistance with his CHF.   # acute  on chronic HFrEF  Not hypervolemic on exam, no crackles today. On baseline O2. Diuresing well with IV lasix.  - decrease IV lasix 40mg  once daily for now, consider change to PO tomorrow AM - continue GDMT with metoprolol as below. ARB/ARNI & SGLT2i limited with BP and CKD. Intolerant of spiro.   # COPD with exacerbation  # chronic respiratory failure (2L)  - agree with current therapy per primary team  - on steroids, duonebs  # paroxysmal AF RVR  # hx VT  Rate better controlled today. No evidence of recurrent VT.  - continue metoprolol tartrate to 25mg  q6h for today, likely consolidate tomorrow - consider short acting PO cardizem if rate remains uncontrolled.  -Continue to hold amiodarone with transaminitis.  Recommend close follow-up with electrophysiology at discharge.  # CKD 3  Slight bump in Cr today, decrease frequency of lasix as above   # demand ischemia  Borderline elevated and flat trending 21, 23, which is most consistent with demand/supply mismatch and not ACS   This patient's plan of care was discussed and created with Dr. Darrold Junker and he is in agreement.  Signed: Rebeca Allegra , PA-C 11/13/2022, 9:44 AM [facility] Card[facility]

## 2022-11-13 NOTE — Evaluation (Signed)
Physical Therapy Evaluation Patient Details Name: Matthew Zamora MRN: [id number] DOB: Jun 24, 1940 Today's Date: 11/13/2022  History of Present Illness  Broderick Collamore is an 81yoM who comes to [facility] on 5/26 from [facility] c/p CP. Pt admitted in COPD exacerbation, acute CHF exacerbation BNP 1044, PNA. 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  Clinical Impression  Pt asleep on entry, but awakens to voice, quite upset that no one work him when his lunch arrived and now it cold. Pt agreeable to evaluation, familiar to our services from prior admission. Pt has no pain meds ordered yet, but is able to perform A/ROM of BILAT legs with only soreness in RLE, much improved since prior admission. Pt comes to EOB c minA (HOB up), is dizzy at EOB for a few minutes which clears up, then he is agreeable to attempt STS. He rises to standing with minGuard Assist, RW, and elevated EOB, much improved since prior admission. RN made aware of pain meds situation. Author also reached out to pharmacist regarding baseline hypotension meds. AF/RVR stable while mobilizing and standing, rates unchanged. Pt denies any SOB, CP, dizziness in session aside from that mentioned above. 100% SpO2on 3L/min. Pt assisted back to bed as requested, HOB elevated and set up with lunch tray. Pt continues to demonstrate progress toward goals of mobility, but still has yet to tolerate any appreciable AMB, this is the first time he has been able to come to standing without heavy physical assistance. He has a long way to go before being ready for safe DC to home alone with support. Will continue follow.      Recommendations for follow up therapy are one component of a multi-disciplinary discharge planning process, led by the attending  physician.  Recommendations may be updated based on patient status, additional functional criteria and insurance authorization.  Follow Up Recommendations Can patient physically be transported by private vehicle: No     Assistance Recommended at Discharge Intermittent Supervision/Assistance  Patient can return home with the following  Two people to help with walking and/or transfers;Two people to help with bathing/dressing/bathroom;Help with stairs or ramp for entrance    Equipment Recommendations None recommended by PT  Recommendations for Other Services       Functional Status Assessment Patient has had a recent decline in their functional status and demonstrates the ability to make significant improvements in function in a reasonable and predictable amount of time.     Precautions / Restrictions Precautions Precautions: Fall;Posterior Hip Restrictions RLE Weight Bearing: Weight bearing as tolerated (pe rprior admission (Rt HHA 5/16))      Mobility  Bed Mobility Overal bed mobility: Needs Assistance Bed Mobility: Supine to Sit, Sit to Supine     Supine to sit: Min assist Sit to supine: Min assist        Transfers Overall transfer level: Needs assistance Equipment used: Rolling walker (2 wheels) Transfers: Sit to/from Stand Sit to Stand: From elevated surface, Min guard           General transfer comment: able to remain standing at bedside x 30sec; HR stable. Pain apparently stable. Pt makes no comment.    Ambulation/Gait Ambulation/Gait assistance:  (deferred due to especailly low frustration tolerance and continued AF RVR)  Stairs            Wheelchair Mobility    Modified Rankin (Stroke Patients Only)       Balance                                             Pertinent Vitals/Pain Pain Assessment Pain Assessment: Faces Faces Pain Scale: Hurts little more Pain Location: Rt hip a little sore Pain  Descriptors / Indicators: Grimacing, Tender, Aching Pain Intervention(s): Limited activity within patient's tolerance, Monitored during session    Home Living Family/patient expects to be discharged to:: Skilled nursing facility Living Arrangements: Alone Available Help at Discharge: Family;Available PRN/intermittently Type of Home: Mobile home Home Access: Ramped entrance       Home Layout: One level Home Equipment: Rollator (4 wheels);Rolling Walker (2 wheels);Cane - single point;Wheelchair - manual;Toilet riser Additional Comments: DIL manages medications, provides transportation and check on patient 2x/day    Prior Function Prior Level of Function : Needs assist  Cognitive Assist : ADLs (cognitive)   ADLs (Cognitive): Set up cues       Mobility Comments: room to room distances in his mobile home, intermittent RW use; recurrent falls. ADLs Comments: pt reports DIL assists with med management, meals; reports     Hand Dominance   Dominant Hand: Right    Extremity/Trunk Assessment                Communication   Communication: No difficulties  Cognition Arousal/Alertness: Awake/alert Behavior During Therapy: WFL for tasks assessed/performed Overall Cognitive Status: Within Functional Limits for tasks assessed                                 General Comments: memory difficulty        General Comments      Exercises Total Joint Exercises Short Arc Quad: Supine, AROM, Both, 5 reps Marching in Standing: AROM, Both, 5 reps, Supine   Assessment/Plan    PT Assessment Patient needs continued PT services  PT Problem List Decreased strength;Decreased range of motion;Decreased activity tolerance;Decreased balance;Decreased mobility;Decreased coordination;Decreased knowledge of use of DME;Decreased safety awareness;Decreased knowledge of precautions;Cardiopulmonary status limiting activity       PT Treatment Interventions DME instruction;Gait  training;Stair training;Functional mobility training;Therapeutic activities;Therapeutic exercise;Balance training;Neuromuscular re-education;Patient/family education    PT Goals (Current goals can be found in the Care Plan section)  Acute Rehab PT Goals Patient Stated Goal: return to walking, improve pain, get stronger PT Goal Formulation: With patient Time For Goal Achievement: 11/27/22 Potential to Achieve Goals: Good    Frequency Min 4X/week     Co-evaluation               AM-PAC PT "6 Clicks" Mobility  Outcome Measure Help needed turning from your back to your side while in a flat bed without using bedrails?: A Lot Help needed moving from lying on your back to sitting on the side of a flat bed without using bedrails?: A Lot Help needed moving to and from a bed to a chair (including a wheelchair)?: A Lot Help needed standing up from a chair using your arms (e.g., wheelchair or bedside chair)?: A Lot Help needed to walk in hospital room?: A Lot Help needed climbing 3-5 steps with a railing? : A Lot 6 Click Score: 12  End of Session   Activity Tolerance: Patient tolerated treatment well Patient left: with call bell/phone within reach;in bed;Other (comment) (set up with lunch.) Nurse Communication: Mobility status (asked for pain meds orders and ?baseline hypotension meds) PT Visit Diagnosis: Unsteadiness on feet (R26.81);Difficulty in walking, not elsewhere classified (R26.2);Other abnormalities of gait and mobility (R26.89)    Time: 1341-1406 PT Time Calculation (min) (ACUTE ONLY): 25 min   Charges:   PT Evaluation $PT Eval Moderate Complexity: 1 Mod PT Treatments $Therapeutic Activity: 8-22 mins       2:45 PM, 11/13/22 Rosamaria Lints, PT, DPT Physical Therapist - [facility]  [phone] (ASCOM)   Pershing Skidmore C 11/13/2022, 2:40 PM

## 2022-11-13 NOTE — Progress Notes (Signed)
[facility]  - [place] [facility]   PATIENT NAME: Matthew Zamora    MR#:  [id number]  DATE OF BIRTH:  1940/09/17  SUBJECTIVE:  no family at bedside. Spoke with daughter-in-law Matthew Zamora. Patient states he feels bit better with shortness of breath. Remains heart rate 100 to 115. Agreeable for physical therapy.    VITALS:  Blood pressure 117/83, pulse (!) 109, temperature 98 F (36.7 C), temperature source Oral, resp. rate 19, height 5\' 8"  (1.727 m), weight 65.6 kg, SpO2 98 %.  PHYSICAL EXAMINATION:   GENERAL:  82 y.o.-year-old patient with no acute distress. Weak, debility LUNGS: Normal breath sounds bilaterally, no wheezing CARDIOVASCULAR: S1, S2 normal. No murmur tachycardia ABDOMEN: Soft, nontender, nondistended. Bowel sounds present.  EXTREMITIES: No  edema b/l.    NEUROLOGIC: nonfocal  patient is alert and awake SKIN: No obvious rash, lesion, or ulcer.   LABORATORY PANEL:  CBC Recent Labs  Lab 11/13/22 0536  WBC 13.8*  HGB 8.8*  HCT 27.7*  PLT 343    Chemistries  Recent Labs  Lab 11/12/22 0529 11/13/22 0536  NA 135 135  K 4.4 4.7  CL 98 94*  CO2 26 32  GLUCOSE 153* 121*  BUN 33* 41*  CREATININE 1.34* 1.51*  CALCIUM 8.9 9.0  MG 2.1  --   AST 264*  --   ALT 249*  --   ALKPHOS 72  --   BILITOT 0.6  --            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  During previous hospitalization patient had asymptomatic V. tach/V-fib and received ICD shock after that he was converted to sinus rhythm.  Due to frequent ICD discharges patient was started on amiodarone drip and discharged on p.o. amiodarone. Patient return for worsening shortness of breath.  Chest x-ray showed  small bilateral pleural effusions and increasing pulmonary vascularity concerning for early CHF  Acute on chronic systolic CHF -Echocardiogram from 5/26 showed EF 30 to 35%, moderately decreased function of left ventricle with global hypokinesis -BNP elevated 1044; started on Lasix 40 mg IV every 12 hours -Continue to monitor strict intake and output -[facility] following   Atrial fibrillation with RVR -Amiodarone stopped by cardiology due to worsening LFTs -Will try to control heart rate with metoprolol -Dose of metoprolol changed to 25 mg p.o. every 6 hours -Continue anticoagulation with apixaban -Continue metoprolol 2.5 mg IV every 6 hours as needed --pt follows with EP at [facility] Dr Excell Seltzer   ? Multifocal pneumonia -Started on vancomycin and cefepime for multifocal pneumonia seen on CT chest -Patient is afebrile, normal WBC count, no increasing oxygen requirement -Procalcitonin is less than 0.10, urinary strep pneumo antigen negative -Discussed with ID, antibiotics were discontinued   COPD exacerbation -Started on Solu-Medrol 40 mg IV every 12 hours -Discontinued  scheduled DuoNebs due to worsening A-fib with RVR -Started Atrovent nebulizer every 6 hours -Continue Xopenex nebulizer every 6 hours as needed  Chronic Orthostatic hypotension --pt on pyridostigmine and midodrine --for now resume pyridostigmine   Transaminitis -Presenting with worsening LFTs; was on amiodarone at discharge -Likely from hepatic congestion from CHF exacerbation -Follow LFTs   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 -Creatinine 1.34 today -Patient currently on IV Lasix --  baseline creat 1.2--1.5   History of V-fib/VT -S/p ICD discharges during previous admission -He was started on amiodarone which is currently discontinued due to transaminitis --pt follows with Dr Huel Coventry at [facility] (EP)   CAD s/p MI  -DES RCA 2019 -no active chest pain    Recent Hx of Right hip fx s/p repair -PT to be  initiated   Procedures: Family communication :DIL Matthew Zamora Consults :[facility] cardiology CODE STATUS: FULL DVT Prophylaxis :apixaban Level of care: Progressive Status is: Inpatient Remains inpatient appropriate because: CHF and tachycardia    TOTAL TIME TAKING CARE OF THIS PATIENT: 35 minutes.  >50% time spent on counselling and coordination of care  Note: This dictation was prepared with Dragon dictation along with smaller phrase technology. Any transcriptional errors that result from this process are unintentional.  Enedina Finner M.D    [facility]   CC: Primary care physician; Matthew Mina, MD

## 2022-11-14 DIAGNOSIS — J441 Chronic obstructive pulmonary disease with (acute) exacerbation: Secondary | ICD-10-CM | POA: Diagnosis not present

## 2022-11-14 LAB — COMPREHENSIVE METABOLIC PANEL
ALT: 205 U/L — ABNORMAL HIGH (ref 0–44)
AST: 113 U/L — ABNORMAL HIGH (ref 15–41)
Albumin: 2.6 g/dL — ABNORMAL LOW (ref 3.5–5.0)
Alkaline Phosphatase: 70 U/L (ref 38–126)
Anion gap: 8 (ref 5–15)
BUN: 38 mg/dL — ABNORMAL HIGH (ref 8–23)
CO2: 30 mmol/L (ref 22–32)
Calcium: 8.3 mg/dL — ABNORMAL LOW (ref 8.9–10.3)
Chloride: 93 mmol/L — ABNORMAL LOW (ref 98–111)
Creatinine, Ser: 1.34 mg/dL — ABNORMAL HIGH (ref 0.61–1.24)
GFR, Estimated: 54 mL/min — ABNORMAL LOW (ref >60)
Glucose, Bld: 165 mg/dL — ABNORMAL HIGH (ref 70–99)
Potassium: 4.2 mmol/L (ref 3.5–5.1)
Sodium: 131 mmol/L — ABNORMAL LOW (ref 135–145)
Total Bilirubin: 0.6 mg/dL (ref 0.3–1.2)
Total Protein: 5.1 g/dL — ABNORMAL LOW (ref 6.5–8.1)

## 2022-11-14 LAB — CULTURE, BLOOD (ROUTINE X 2)
Culture: NO GROWTH
Special Requests: ADEQUATE

## 2022-11-14 MED ORDER — LEVALBUTEROL HCL 0.63 MG/3ML IN NEBU: 0.6300 mg | INHALATION_SOLUTION | Freq: Four times a day (QID) | RESPIRATORY_TRACT | Status: DC | PRN

## 2022-11-14 MED ORDER — PREDNISONE 20 MG PO TABS
20.0000 mg | ORAL_TABLET | Freq: Every day | ORAL | Status: AC
  Administered 2022-11-15 – 2022-11-16 (×2): 20 mg via ORAL
  Filled 2022-11-14 (×2): qty 1

## 2022-11-14 MED ORDER — METOPROLOL TARTRATE 25 MG PO TABS
25.0000 mg | ORAL_TABLET | Freq: Three times a day (TID) | ORAL | Status: DC
  Administered 2022-11-14 – 2022-11-15 (×3): 25 mg via ORAL
  Filled 2022-11-14 (×4): qty 1

## 2022-11-14 MED ORDER — FUROSEMIDE 40 MG PO TABS
40.0000 mg | ORAL_TABLET | Freq: Every day | ORAL | Status: DC
  Administered 2022-11-15 – 2022-11-18 (×4): 40 mg via ORAL
  Filled 2022-11-14 (×4): qty 1

## 2022-11-14 NOTE — Progress Notes (Signed)
Turning patient every two hours-patient is found to have self removed pillows to remain flat on back- provided education

## 2022-11-14 NOTE — Progress Notes (Signed)
[facility] CLINIC CARDIOLOGY CONSULT NOTE       Patient ID: UZIEL CAPLETTE MRN: [id number] DOB/AGE: 81-Oct-1942 81 y.o.  Admit date: 11/10/2022 Referring Physician Dr. Skip Mayer Primary Physician Dr. Jerl Mina Primary Cardiologist Derwood Kaplan, NP [facility] Cardiology)  Reason for Consultation CHF  HPI: Ye Rocchi. 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 During that admission the patient's defibrillator shocked him for VF, subsequently loaded with amiodarone. He was discharged to a SNF on 5/23. He presented back to [facility] ED on 5/26 with shortness of breath. Cardiology consulted for assistance with his CHF.   Interval History:  - feels better today, no chest pain or pleuritic discomfort. Feels like he's coughing a little more than yesterday. No palpitations or LE edema.  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 pylori infection    History of hepatitis    Hyperlipidemia    Hypertension    Liver cyst 05/16/15   PAF (paroxysmal atrial fibrillation) ([name])    Prostate CA ([name])     Past Surgical History:  Procedure Laterality Date   CATARACT EXTRACTION     COLONOSCOPY  10/06/2014,  09/18/2004, 03/14/2009   ESOPHAGOGASTRODUODENOSCOPY  10/06/2014, 03/18/2001, 03/14/2009   ESOPHAGOGASTRODUODENOSCOPY (EGD) WITH PROPOFOL N/A 05/07/2018   Procedure: ESOPHAGOGASTRODUODENOSCOPY (EGD) WITH PROPOFOL;  Surgeon: Toledo, Boykin Nearing, MD;  Location: [facility] ENDOSCOPY;  Service: Gastroenterology;  Laterality: N/A;   ESOPHAGOGASTRODUODENOSCOPY (EGD) WITH PROPOFOL N/A 04/22/2022   Procedure: ESOPHAGOGASTRODUODENOSCOPY (EGD) WITH PROPOFOL;  Surgeon: Toney Reil, MD;  Location: [facility] ENDOSCOPY;  Service: Endoscopy;  Laterality: N/A;   FLEXIBLE SIGMOIDOSCOPY  08/26/1990   HIP ARTHROPLASTY Right 10/31/2022   Procedure: ARTHROPLASTY BIPOLAR HIP (HEMIARTHROPLASTY)-RNFA;  Surgeon: Reinaldo Berber, MD;  Location: [facility] ORS;  Service: Orthopedics;  Laterality: Right;   INSERTION OF ICD     PROSTATE SURGERY     TONSILLECTOMY      Medications Prior to Admission  Medication Sig Dispense Refill Last Dose   amiodarone (PACERONE) 400 MG tablet Take 1 tablet (400 mg total) by mouth 2 (two) times daily for 10 days. Followed by 400mg  daily 20 tablet 0 11/09/2022 at 2245   ascorbic acid (VITAMIN C) 500 MG tablet Take 1 tablet by mouth daily.   11/09/2022 at 0947   atorvastatin (LIPITOR) 80 MG tablet Take 80 mg by mouth daily.   11/09/2022 at 1731   calcium carbonate (TUMS EX) 750 MG chewable tablet Chew 1 tablet by mouth daily.   11/09/2022 at 0947   ELIQUIS 2.5 MG TABS tablet Take 2.5 mg by mouth 2 (two) times daily.   11/09/2022 at 2245   fluticasone-salmeterol (ADVAIR) 500-50  MCG/ACT AEPB Inhale 1 puff into the lungs in the morning and at bedtime.   11/10/2022 at 1126   gabapentin (NEURONTIN) 100 MG capsule Take 100 mg by mouth at bedtime.   11/09/2022 at 2245   HYDROcodone-acetaminophen (NORCO/VICODIN) 5-325 MG tablet Take 1-2 tablets by mouth every 4 (four) hours as needed for moderate pain (pain score 4-6). 30 tablet 0 11/10/2022 at 1223   magnesium oxide (MAG-OX) 400 (240 Mg) MG tablet Take 1 tablet by mouth  daily.   11/09/2022 at 0947   melatonin 3 MG TABS tablet Take 9 mg by mouth at bedtime.   11/09/2022 at 2245   metoprolol tartrate (LOPRESSOR) 25 MG tablet Take 0.5 tablets (12.5 mg total) by mouth 2 (two) times daily.   11/09/2022 at 2245   midodrine (PROAMATINE) 5 MG tablet Take 3 tablets (15 mg total) by mouth 3 (three) times daily with meals.   11/09/2022 at 2245   Multiple Vitamin (MULTIVITAMIN WITH MINERALS) TABS tablet Take 1 tablet by mouth daily.   11/09/2022 at 0947   pantoprazole (PROTONIX) 40 MG tablet Take 1 tablet (40 mg total) by mouth 2 (two) times daily.   11/09/2022 at 1647   polyethylene glycol (MIRALAX / GLYCOLAX) 17 g packet Take 17 g by mouth 2 (two) times daily. 14 each 0 11/09/2022 at 1647   pyridostigmine (MESTINON) 60 MG tablet Take 1 tablet (60 mg total) by mouth every 8 (eight) hours.   11/10/2022 at 0637   sertraline (ZOLOFT) 25 MG tablet Take 25 mg by mouth daily.   11/09/2022 at 0947   sucralfate (CARAFATE) 1 GM/10ML suspension Take 10 mLs (1 g total) by mouth 4 (four) times daily -  with meals and at bedtime. 420 mL 0 11/09/2022 at 2245   tamsulosin (FLOMAX) 0.4 MG CAPS capsule Take 0.4 mg by mouth daily.   11/09/2022 at 0947   acetaminophen (TYLENOL) 325 MG tablet Take 2 tablets (650 mg total) by mouth every 4 (four) hours as needed for headache or mild pain.   11/08/2022 at 1401   albuterol (PROVENTIL) (2.5 MG/3ML) 0.083% nebulizer solution Inhale 3 mLs (2.5 mg total) into the lungs as needed for shortness of breath. 75 mL 12 PRN at PRN   albuterol (VENTOLIN HFA) 108 (90 Base) MCG/ACT inhaler Inhale 2 puffs into the lungs every 4 (four) hours as needed.   11/08/2022 at 1401   [START ON 11/17/2022] amiodarone (PACERONE) 400 MG tablet Take 1 tablet (400 mg total) by mouth daily. To be started after completing 10 days of the twice daily dosing 30 tablet 0    metoCLOPramide (REGLAN) 5 MG tablet Take 1 tablet (5 mg total) by mouth every 8 (eight) hours as needed for refractory nausea /  vomiting.   PRN at PRN   Nebulizer MISC 1 each by Does not apply route as needed. 1 each 0    Nutritional Supplements (,FEEDING SUPPLEMENT, PROSOURCE PLUS) liquid Take 30 mLs by mouth 3 (three) times daily between meals.   11/08/2022 at 1125   ondansetron (ZOFRAN-ODT) 4 MG disintegrating tablet Take 4 mg by mouth every 8 (eight) hours as needed for refractory nausea / vomiting, vomiting or nausea.   PRN at PRN   Social History   Socioeconomic History   Marital status: Single    Spouse name: Not on file   Number of children: Not on file   Years of education: Not on file   Highest education level: Not on file  Occupational History  Not on file  Tobacco Use   Smoking status: Former    Packs/day: 1.00    Years: 38.00    Additional pack years: 0.00    Total pack years: 38.00    Types: Cigarettes    Quit date: 06/18/1999    Years since quitting: 23.4   Smokeless tobacco: Never  Vaping Use   Vaping Use: Never used  Substance and Sexual Activity   Alcohol use: No    Alcohol/week: 0.0 standard drinks of alcohol   Drug use: Not Currently   Sexual activity: Not Currently  Other Topics Concern   Not on file  Social History Narrative   Not on file   Social Determinants of Health   Financial Resource Strain: Not on file  Food Insecurity: No Food Insecurity (11/10/2022)   Hunger Vital Sign    Worried About Running Out of Food in the Last Year: Never true    Ran Out of Food in the Last Year: Never true  Transportation Needs: No Transportation Needs (11/10/2022)   PRAPARE - Administrator, Civil Service (Medical): No    Lack of Transportation (Non-Medical): No  Physical Activity: Not on file  Stress: Not on file  Social Connections: Not on file  Intimate Partner Violence: Not At Risk (11/10/2022)   Humiliation, Afraid, Rape, and Kick questionnaire    Fear of Current or Ex-Partner: No    Emotionally Abused: No    Physically Abused: No    Sexually Abused: No    Family  History  Problem Relation Age of Onset   Heart attack Mother    Heart attack Father       Intake/Output Summary (Last 24 hours) at 11/14/2022 1025 Last data filed at 11/14/2022 0421 Gross per 24 hour  Intake 350 ml  Output 1000 ml  Net -650 ml     Vitals:   11/14/22 0424 11/14/22 0513 11/14/22 0516 11/14/22 0800  BP:  (!) 112/97  109/84  Pulse:  (!) 102  (!) 105  Resp:  20  (!) 29  Temp: 97.6 F (36.4 C) 98.1 F (36.7 C)  98 F (36.7 C)  TempSrc:  Axillary    SpO2:  96%  97%  Weight:   67.9 kg   Height:        PHYSICAL EXAM General: elderly male , well nourished, in no acute distress. Sitting at incline in bed eating breakfast HEENT:  Normocephalic and atraumatic. Neck:  No JVD.  Lungs: Normal respiratory effort on 2L [place].  Decreased breath sounds without appreciable crackles or wheezing Heart: irregularly irregular with controlled rate. Normal S1 and S2 without gallops or murmurs.  Abdomen: Non-distended appearing.  Msk: Normal strength and tone for age. Extremities: Warm and well perfused. No clubbing, cyanosis.  No peripheral edema.  Neuro: Alert and oriented X 3. Psych:  Answers questions appropriately.   Labs: Basic Metabolic Panel: Recent Labs    11/12/22 0529 11/13/22 0536 11/14/22 0548  NA 135 135 131*  K 4.4 4.7 4.2  CL 98 94* 93*  CO2 26 32 30  GLUCOSE 153* 121* 165*  BUN 33* 41* 38*  CREATININE 1.34* 1.51* 1.34*  CALCIUM 8.9 9.0 8.3*  MG 2.1  --   --     Liver Function Tests: Recent Labs    11/12/22 0529 11/14/22 0548  AST 264* 113*  ALT 249* 205*  ALKPHOS 72 70  BILITOT 0.6 0.6  PROT 6.0* 5.1*  ALBUMIN 2.9* 2.6*  No results for input(s): "LIPASE", "AMYLASE" in the last 72 hours. CBC: Recent Labs    11/12/22 0529 11/13/22 0536  WBC 18.1* 13.8*  HGB 9.2* 8.8*  HCT 28.5* 27.7*  MCV 90.5 91.4  PLT 335 343    Cardiac Enzymes: Recent Labs    11/11/22 1633 11/11/22 1850  TROPONINIHS 21* 23*    BNP: No results for  input(s): "BNP" in the last 72 hours.  D-Dimer: No results for input(s): "DDIMER" in the last 72 hours. Hemoglobin A1C: No results for input(s): "HGBA1C" in the last 72 hours. Fasting Lipid Panel: No results for input(s): "CHOL", "HDL", "LDLCALC", "TRIG", "CHOLHDL", "LDLDIRECT" in the last 72 hours. Thyroid Function Tests: No results for input(s): "TSH", "T4TOTAL", "T3FREE", "THYROIDAB" in the last 72 hours.  Invalid input(s): "FREET3" Anemia Panel: No results for input(s): "VITAMINB12", "FOLATE", "FERRITIN", "TIBC", "IRON", "RETICCTPCT" in the last 72 hours.   Radiology: ECHOCARDIOGRAM COMPLETE  Result Date: 11/10/2022    ECHOCARDIOGRAM REPORT   Patient Name:   DUGAN WACHTLER Date of Exam: 11/10/2022 Medical Rec #:  [id number]           Height:       68.0 in Accession #:    [id number]          Weight:       153.0 lb Date of Birth:  08-06-1940           BSA:          1.824 m Patient Age:    81 years            BP:           126/82 mmHg Patient Gender: M                   HR:           121 bpm. Exam Location:  [facility] Procedure: 2D Echo Indications:     CHF I50.21  History:         Patient has prior history of Echocardiogram examinations, most                  recent 04/21/2022.  Sonographer:     Overton Mam RDCS, FASE Referring Phys:  [id number] Beulah Gandy A THOMAS Diagnosing Phys: Julien Nordmann MD  Sonographer Comments: Technically difficult study due to poor echo windows and suboptimal parasternal window. Image acquisition challenging due to respiratory motion. IMPRESSIONS  1. Left ventricular ejection fraction, by estimation, is 30 to 35%. Left ventricular ejection fraction by PLAX is 34 %. The left ventricle has moderately decreased function. The left ventricle demonstrates global hypokinesis. The left ventricular internal cavity size was mildly dilated. Left ventricular diastolic parameters are indeterminate.  2. Right ventricular systolic function is normal. The right ventricular size is  normal. There is moderately elevated pulmonary artery systolic pressure. The estimated right ventricular systolic pressure is 51.2 mmHg.  3. The mitral valve is normal in structure. Mild mitral valve regurgitation. No evidence of mitral stenosis.  4. The aortic valve is tricuspid. Aortic valve regurgitation is not visualized. Aortic valve sclerosis is present, with no evidence of aortic valve stenosis.  5. The inferior vena cava is normal in size with <50% respiratory variability, suggesting right atrial pressure of 8 mmHg.  6. Rhythm is atrial fibrillation rate 102 to 130 bpm FINDINGS  Left Ventricle: Left ventricular ejection fraction, by estimation, is 30 to 35%. Left ventricular ejection fraction by PLAX is 34 %. The left ventricle has moderately  decreased function. The left ventricle demonstrates global hypokinesis. The left ventricular internal cavity size was mildly dilated. There is no left ventricular hypertrophy. Left ventricular diastolic parameters are indeterminate. Right Ventricle: The right ventricular size is normal. No increase in right ventricular wall thickness. Right ventricular systolic function is normal. There is moderately elevated pulmonary artery systolic pressure. The tricuspid regurgitant velocity is 3.40 m/s, and with an assumed right atrial pressure of 5 mmHg, the estimated right ventricular systolic pressure is 51.2 mmHg. Left Atrium: Left atrial size was normal in size. Right Atrium: Right atrial size was normal in size. Pericardium: There is no evidence of pericardial effusion. Mitral Valve: The mitral valve is normal in structure. Mild mitral valve regurgitation. No evidence of mitral valve stenosis. Tricuspid Valve: The tricuspid valve is normal in structure. Tricuspid valve regurgitation is mild . No evidence of tricuspid stenosis. Aortic Valve: The aortic valve is tricuspid. Aortic valve regurgitation is not visualized. Aortic valve sclerosis is present, with no evidence of  aortic valve stenosis. Aortic valve peak gradient measures 10.8 mmHg. Pulmonic Valve: The pulmonic valve was normal in structure. Pulmonic valve regurgitation is not visualized. No evidence of pulmonic stenosis. Aorta: The aortic root is normal in size and structure. Venous: The inferior vena cava is normal in size with less than 50% respiratory variability, suggesting right atrial pressure of 8 mmHg. IAS/Shunts: No atrial level shunt detected by color flow Doppler. Additional Comments: A device lead is visualized.  LEFT VENTRICLE PLAX 2D LV EF:         Left            Diastology                ventricular     LV e' medial:    10.30 cm/s                ejection        LV E/e' medial:  11.2                fraction by     LV e' lateral:   16.20 cm/s                PLAX is 34      LV E/e' lateral: 7.1                %. LVIDd:         5.00 cm LVIDs:         4.20 cm LV PW:         1.10 cm LV IVS:        1.20 cm LVOT diam:     1.80 cm LV SV:         36 LV SV Index:   20 LVOT Area:     2.54 cm  LV Volumes (MOD) LV vol d, MOD    102.0 ml A4C: LV vol s, MOD    56.2 ml A4C: LV SV MOD A4C:   102.0 ml RIGHT VENTRICLE RV Basal diam:  2.70 cm RV S prime:     13.50 cm/s TAPSE (M-mode): 1.5 cm LEFT ATRIUM           Index        RIGHT ATRIUM           Index LA diam:      4.20 cm 2.30 cm/m   RA Area:     17.10 cm LA Vol (A2C): 44.0 ml 24.13 ml/m  RA  Volume:   46.10 ml  25.28 ml/m LA Vol (A4C): 55.6 ml 30.49 ml/m  AORTIC VALVE                 PULMONIC VALVE AV Area (Vmax): 1.51 cm     RVOT Peak grad: 3 mmHg AV Vmax:        164.00 cm/s AV Peak Grad:   10.8 mmHg LVOT Vmax:      97.40 cm/s LVOT Vmean:     61.300 cm/s LVOT VTI:       0.141 m  AORTA Ao Root diam: 3.50 cm MITRAL VALVE                TRICUSPID VALVE MV Area (PHT): 3.21 cm     TR Peak grad:   46.2 mmHg MV Decel Time: 236 msec     TR Vmax:        340.00 cm/s MV E velocity: 115.00 cm/s                             SHUNTS                             Systemic VTI:  0.14 m                              Systemic Diam: 1.80 cm Julien Nordmann MD Electronically signed by Julien Nordmann MD Signature Date/Time: 11/10/2022/6:47:31 PM    Final    CT CHEST WO CONTRAST  Result Date: 11/10/2022 CLINICAL DATA:  Chest pain and shortness of breath. EXAM: CT CHEST WITHOUT CONTRAST TECHNIQUE: Multidetector CT imaging of the chest was performed following the standard protocol without IV contrast. RADIATION DOSE REDUCTION: This exam was performed according to the departmental dose-optimization program which includes automated exposure control, adjustment of the mA and/or kV according to patient size and/or use of iterative reconstruction technique. COMPARISON:  Chest x-ray from same day. CT chest dated October 15, 2022. FINDINGS: Cardiovascular: Unchanged left chest wall pacemaker. Unchanged mild cardiomegaly. No pericardial effusion. No thoracic aortic aneurysm. Coronary, aortic arch, and branch vessel atherosclerotic vascular disease. Mediastinum/Nodes: Enlarged right paratracheal lymph node currently measuring 1.5 cm in short axis, previously 1.1 cm. Additional subcentimeter mediastinal and right hilar lymph nodes are unchanged. No enlarged axillary lymph nodes. The thyroid gland, trachea, and esophagus demonstrate no significant findings. Lungs/Pleura: Small bilateral pleural effusions. Scattered mild smooth interlobular septal thickening. New scattered ill-defined ground-glass densities and denser nodular opacities in both upper lobes, the right middle lobe, and the anterior left lower lobe. Few new small nodules in the superior segment of the right lower lobe. Subsegmental atelectasis in both posterior lower lobes. No pneumothorax. Unchanged 6 mm nodule left lower lobe (series 3, image 83). Unchanged 5 mm nodule in the right lower lobe (series 3, image 89). The previously seen ground-glass nodule in the right upper lobe is no longer identified. Mild centrilobular emphysema. Upper Abdomen: No acute  abnormality. Unchanged tiny gallstone. Partially visualized complex cyst in the inferior right liver, as seen on prior studies. Musculoskeletal: No acute or significant osseous findings. IMPRESSION: 1. New scattered ill-defined ground-glass densities and denser nodular opacities in both upper lobes, the right middle lobe, and the anterior left lower lobe, concerning for multifocal pneumonia. 2. Small bilateral pleural effusions with mild interstitial pulmonary edema. 3. Enlarged right paratracheal lymph  node currently measuring 1.5 cm in short axis, previously 1.1 cm, likely reactive. 4. Previously seen ground-glass nodule in the right upper lobe is no longer identified. Other previously identified small pulmonary nodules in both lower lobes are unchanged. 5. Aortic Atherosclerosis (ICD10-I70.0) and Emphysema (ICD10-J43.9). Electronically Signed   By: Obie Dredge M.D.   On: 11/10/2022 14:00   DG Chest 2 View  Result Date: 11/10/2022 CLINICAL DATA:  Shortness of breath. EXAM: CHEST - 2 VIEW COMPARISON:  10/30/22 FINDINGS: Left chest wall ICD noted with leads in the right atrial appendage and right ventricle. Heart size is normal. Aortic atherosclerosis. Small bilateral posterior layering pleural effusions are identified on the lateral projection radiograph. Increased pulmonary vascularity. No frank edema. No airspace consolidation. IMPRESSION: 1. Small bilateral pleural effusions and increased pulmonary vascularity concerning for early CHF. Electronically Signed   By: Signa Kell M.D.   On: 11/10/2022 10:04   DG Chest Port 1 View  Result Date: 11/04/2022 CLINICAL DATA:  Cough EXAM: PORTABLE CHEST 1 VIEW COMPARISON:  X-ray 10/30/2022 FINDINGS: Underinflation compared to the prior x-ray. Stable cardiopericardial silhouette with calcified aorta. Left upper chest defibrillator with leads overlying the right side of the heart. Vascular congestion. No pneumothorax or effusion. Question trace edema. No  separate consolidation IMPRESSION: Vascular congestion with question trace edema. Defibrillator. Decreased inflation compared to prior x-ray.  Recommend follow-up Electronically Signed   By: Karen Kays M.D.   On: 11/04/2022 10:38   DG Pelvis Portable  Result Date: 10/31/2022 CLINICAL DATA:  Bipolar right hip hemiarthroplasty. EXAM: PORTABLE PELVIS 1-2 VIEWS COMPARISON:  CT right hip 11/02/2022 FINDINGS: Single frontal view of the bilateral hips. Interval bipolar right hip hemiarthroplasty with normal alignment. Expected postoperative subcutaneous air about the right hip. Mild superomedial left femoroacetabular joint space narrowing. Surgical clips overlie the pelvis. No acute fracture or dislocation. IMPRESSION: Interval bipolar right hip hemiarthroplasty with normal alignment. Electronically Signed   By: Neita Garnet M.D.   On: 10/31/2022 16:23   CT Hip Right Wo Contrast  Result Date: 10/30/2022 CLINICAL DATA:  Right femoral fracture EXAM: CT OF THE RIGHT HIP WITHOUT CONTRAST TECHNIQUE: Multidetector CT imaging of the right hip was performed according to the standard protocol. Multiplanar CT image reconstructions were also generated. RADIATION DOSE REDUCTION: This exam was performed according to the departmental dose-optimization program which includes automated exposure control, adjustment of the mA and/or kV according to patient size and/or use of iterative reconstruction technique. COMPARISON:  Right hips and pelvis radiograph dated 10/30/2022 FINDINGS: Bones/Joint/Cartilage Acute transcervical fracture of the right proximal femur with apex anterior angulation. The right femoroacetabular joint is intact. Ligaments Suboptimally assessed by CT. Muscles and Tendons Grossly intact. Soft tissues Vascular calcifications of the partially imaged right lower extremity arteries. IMPRESSION: Mildly angulated right femoral transcervical fracture. Electronically Signed   By: Agustin Cree M.D.   On: 10/30/2022 20:49    DG Knee 2 Views Right  Result Date: 10/30/2022 CLINICAL DATA:  Right knee pain, fall EXAM: RIGHT KNEE - 1-2 VIEW COMPARISON:  None Available. FINDINGS: Moderate tricompartment degenerative changes with joint space narrowing and spurring. No joint effusion. No acute bony abnormality. Specifically, no fracture, subluxation, or dislocation. IMPRESSION: Moderate degenerative changes.  No acute bony abnormality. Electronically Signed   By: Charlett Nose M.D.   On: 10/30/2022 20:25   DG Hip Unilat W or Wo Pelvis 2-3 Views Right  Result Date: 10/30/2022 CLINICAL DATA:  Fall, right hip pain EXAM: DG HIP (WITH OR WITHOUT PELVIS)  2-3V RIGHT COMPARISON:  None Available. FINDINGS: There is foreshortening of the right femoral neck with cortical irregularity noted laterally concerning for right femoral neck fracture. No subluxation or dislocation. Mild symmetric degenerative changes in the hips with joint space narrowing and spurring. IMPRESSION: Foreshortening of the right femoral neck with cortical irregularity laterally concerning for femoral neck fracture. Electronically Signed   By: Charlett Nose M.D.   On: 10/30/2022 20:24   DG Chest Portable 1 View  Result Date: 10/30/2022 CLINICAL DATA:  Fall, right hip pain EXAM: PORTABLE CHEST 1 VIEW COMPARISON:  09/14/2022 FINDINGS: Left AICD remains in place, unchanged. Heart is normal size. Mediastinal contours within normal limits. No confluent opacities or effusions. No acute bony abnormality. IMPRESSION: No active disease. Electronically Signed   By: Charlett Nose M.D.   On: 10/30/2022 20:22   CT HEAD WO CONTRAST ( )  Result Date: 10/30/2022 CLINICAL DATA:  Trip and fall injury. EXAM: CT HEAD WITHOUT CONTRAST CT CERVICAL SPINE WITHOUT CONTRAST TECHNIQUE: Multidetector CT imaging of the head and cervical spine was performed following the standard protocol without intravenous contrast. Multiplanar CT image reconstructions of the cervical spine were also generated.  RADIATION DOSE REDUCTION: This exam was performed according to the departmental dose-optimization program which includes automated exposure control, adjustment of the mA and/or kV according to patient size and/or use of iterative reconstruction technique. COMPARISON:  Head CT without contrast 04/17/2022, cervical spine CT 03/27/2022. FINDINGS: CT HEAD FINDINGS Brain: There is mild atrophy, small-vessel disease and atrophic ventriculomegaly. No new asymmetry is seen concerning for an acute infarct, hemorrhage or mass. There is no midline shift. The basal cisterns are clear. Vascular: There are patchy calcifications of the carotid siphons but no hyperdense central vessels. Skull: No fracture or focal lesions.  Mild calvarial osteopenia. Sinuses/Orbits: No acute findings. Clear visualized sinuses and mastoid air cells. Negative orbits apart from old lens replacements. Other: None. CT CERVICAL SPINE FINDINGS Alignment: Unchanged. There is a trace 2 mm C3-4 retrolisthesis, similar minimal C4-5 anterolisthesis and trace C5-6 and C6-7 retrolisthesis all believed degenerative and all unchanged. No traumatic listhesis is seen. There is bone-on-bone anterior atlantodental joint space loss with osteophytes, and degenerative cystic changes in the odontoid process. Skull base and vertebrae: No acute fracture is evident. Bone mineralization is osteopenic. No primary bone lesion or focal pathologic process is seen. Soft tissues and spinal canal: No prevertebral fluid or swelling. No visible canal hematoma. There are calcifications in both proximal cervical ICAs, heaviest on the left where there is probably a flow-limiting origin stenosis. No laryngeal or thyroid mass. Disc levels: There is moderate disc space loss again at C3-4, C5-6 and C6-7, mild disc narrowing at C3-4, normal disc heights at C2-3 and C7-T1. There are small bidirectional endplate spurs from C3-4 through C6-7, causing partial effacement of the ventral CSF without  spondylotic cord compression. Facet joint spurring on the left-greater-than-right is seen at most levels with bilateral uncinate spurring. Acquired foraminal stenosis is mild on the right at C2-3, severe on the left and moderate on the right at C3-4, bilaterally mild-to-moderate C6-7, and not seen at the remaining levels. Upper chest: Pacemaker wiring is partially visible entering from the left. Otherwise negative. Other: None. IMPRESSION: 1. No acute intracranial CT findings or depressed skull fractures. 2. Osteopenia and degenerative change without evidence of cervical fractures or traumatic listhesis. 3. 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 Follow-up as indicated. Electronically Signed   By: Earlean Shawl.D.  On: 10/30/2022 20:15   CT Cervical Spine Wo Contrast  Result Date: 10/30/2022 CLINICAL DATA:  Trip and fall injury. EXAM: CT HEAD WITHOUT CONTRAST CT CERVICAL SPINE WITHOUT CONTRAST TECHNIQUE: Multidetector CT imaging of the head and cervical spine was performed following the standard protocol without intravenous contrast. Multiplanar CT image reconstructions of the cervical spine were also generated. RADIATION DOSE REDUCTION: This exam was performed according to the departmental dose-optimization program which includes automated exposure control, adjustment of the mA and/or kV according to patient size and/or use of iterative reconstruction technique. COMPARISON:  Head CT without contrast 04/17/2022, cervical spine CT 03/27/2022. FINDINGS: CT HEAD FINDINGS Brain: There is mild atrophy, small-vessel disease and atrophic ventriculomegaly. No new asymmetry is seen concerning for an acute infarct, hemorrhage or mass. There is no midline shift. The basal cisterns are clear. Vascular: There are patchy calcifications of the carotid siphons but no hyperdense central vessels. Skull: No fracture or focal lesions.  Mild calvarial osteopenia.  Sinuses/Orbits: No acute findings. Clear visualized sinuses and mastoid air cells. Negative orbits apart from old lens replacements. Other: None. CT CERVICAL SPINE FINDINGS Alignment: Unchanged. There is a trace 2 mm C3-4 retrolisthesis, similar minimal C4-5 anterolisthesis and trace C5-6 and C6-7 retrolisthesis all believed degenerative and all unchanged. No traumatic listhesis is seen. There is bone-on-bone anterior atlantodental joint space loss with osteophytes, and degenerative cystic changes in the odontoid process. Skull base and vertebrae: No acute fracture is evident. Bone mineralization is osteopenic. No primary bone lesion or focal pathologic process is seen. Soft tissues and spinal canal: No prevertebral fluid or swelling. No visible canal hematoma. There are calcifications in both proximal cervical ICAs, heaviest on the left where there is probably a flow-limiting origin stenosis. No laryngeal or thyroid mass. Disc levels: There is moderate disc space loss again at C3-4, C5-6 and C6-7, mild disc narrowing at C3-4, normal disc heights at C2-3 and C7-T1. There are small bidirectional endplate spurs from C3-4 through C6-7, causing partial effacement of the ventral CSF without spondylotic cord compression. Facet joint spurring on the left-greater-than-right is seen at most levels with bilateral uncinate spurring. Acquired foraminal stenosis is mild on the right at C2-3, severe on the left and moderate on the right at C3-4, bilaterally mild-to-moderate C6-7, and not seen at the remaining levels. Upper chest: Pacemaker wiring is partially visible entering from the left. Otherwise negative. Other: None. IMPRESSION: 1. No acute intracranial CT findings or depressed skull fractures. 2. Osteopenia and degenerative change without evidence of cervical fractures or traumatic listhesis. 3. 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 Follow-up as indicated.  Electronically Signed   By: Almira Bar M.D.   On: 10/30/2022 20:15    TELEMETRY reviewed by me (LT) 11/14/2022 : AF high 90s-low 100s  EKG reviewed by me: AF rate 116   Data reviewed by me (LT) 11/14/2022: hospitalist progress note, nursing notes, last 24h vitals tele labs imaging I/O    Principal Problem:   COPD exacerbation ([name])    ASSESSMENT AND PLAN:  Viona Gilmore. 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 During that admission the patient's defibrillator shocked him for VF, subsequently loaded with amiodarone. He was  discharged to a SNF on 5/23. He presented back to [facility] ED on 5/26 with shortness of breath. Cardiology consulted for assistance with his CHF.   # acute on chronic HFrEF  Not hypervolemic on exam, no crackles today. On baseline O2. Diuresing well with IV lasix.  - give IV lasix 40mg  x 1 more dose today - start PO lasix 40mg  daily tomorrow - continue GDMT additions of ARB/ARNI & SGLT2i limited with BP and CKD. Intolerant of spiro.   # COPD with exacerbation  # chronic respiratory failure (2L)  - agree with current therapy per primary team  - on steroids, duonebs  # paroxysmal AF RVR  # hx VT  Rate better controlled today. No evidence of recurrent VT.  - decrease metoprolol tartrate to 25mg  three times daily for today, likely consolidate tomorrow -Continue to hold amiodarone with transaminitis.  Recommend close follow-up with [facility] electrophysiology at discharge.  # CKD 3  Stable today, BUN/Cr 38/1.34 and GFR 54.   # demand ischemia  Borderline elevated and flat trending 21, 23, which is most consistent with demand/supply mismatch and not ACS   This patient's plan of care  was discussed and created with Dr. Juliann Pares and he is in agreement.  Signed: Rebeca Allegra , PA-C 11/14/2022, 10:25 AM [facility]

## 2022-11-14 NOTE — Evaluation (Signed)
Occupational Therapy Evaluation Patient Details Name: Matthew Zamora MRN: [id number] DOB: July 07, 1940 Today's Date: 11/14/2022   History of Present Illness Matthew Zamora is an 81yoM who comes to [facility] on 5/26 from [facility] c c/p CP. Pt admitted in COPD exacerbation, acute CHF exacerbation BNP 1044, PNA. 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   Clinical Impression   Pt was seen for OT evaluation this date. Prior to hospital admission, pt was at [facility] following S HHA. Pt endoring 7/10 back pain at rest and agreeable to OT. Pt presents to acute OT demonstrating impaired ADL performance and functional mobility 2/2 back pain and R hip pain, decr strength, balance, and activity tolerance (See OT problem list for additional functional deficits). Pt currently requires MIN A for bed mobility. Upon sitting EOB only momentarily pt required prompt return to supine 2/2 R hip pain. Pt endorsing having just had pain medication but not "kicked in" yet. Pt also endorsing SOB with the effort. Pt educated in PLB to support breath recovery when pt endorses SOB (SpO2 100%, HR up slightly 2/2 exertion). Pt also encouraged to complete BLE movement in the bed to better assess R hip pain throughout the day, given pt endorsed no pain at rest in hip but up to a 5/10 with bed mobility and ultimately limiting ADL mobility this date. Pt would benefit from skilled OT services to address noted impairments and functional limitations (see below for any additional details) in order to maximize safety and independence while minimizing falls risk and caregiver burden. Anticipate the need for follow up OT services upon acute hospital DC.    Recommendations for follow up therapy are one component of a multi-disciplinary discharge  planning process, led by the attending physician.  Recommendations may be updated based on patient status, additional functional criteria and insurance authorization.   Assistance Recommended at Discharge Frequent or constant Supervision/Assistance  Patient can return home with the following Help with stairs or ramp for entrance;A lot of help with walking and/or transfers;A lot of help with bathing/dressing/bathroom;Assistance with cooking/housework    Functional Status Assessment  Patient has had a recent decline in their functional status and demonstrates the ability to make significant improvements in function in a reasonable and predictable amount of time.  Equipment Recommendations  BSC/3in1;Hospital bed    Recommendations for Other Services       Precautions / Restrictions Precautions Precautions: Fall;Posterior Hip Precaution Booklet Issued: No Restrictions Weight Bearing Restrictions: Yes RLE Weight Bearing: Weight bearing as tolerated      Mobility Bed Mobility Overal bed mobility: Needs Assistance Bed Mobility: Supine to Sit, Sit to Supine     Supine to sit: Min assist Sit to supine: Min assist   General bed mobility comments: heavy BUE support    Transfers                   General transfer comment: deferred 2/2 pain      Balance Overall balance assessment: Needs assistance Sitting-balance support: Single extremity supported Sitting balance-Leahy Scale: Poor Sitting balance - Comments: pain limited                                   ADL either performed or assessed with  clinical judgement   ADL Overall ADL's : Needs assistance/impaired                                       General ADL Comments: Pt requires MAX A for B socks, MOD A for LB dressing otherwise     Vision         Perception     Praxis      Pertinent Vitals/Pain Pain Assessment Pain Assessment: 0-10 Pain Score: 7  Pain Location: back Pain  Descriptors / Indicators: Aching Pain Intervention(s): Limited activity within patient's tolerance, Monitored during session, Premedicated before session, Repositioned     Hand Dominance Right   Extremity/Trunk Assessment Upper Extremity Assessment Upper Extremity Assessment: Generalized weakness   Lower Extremity Assessment Lower Extremity Assessment: Generalized weakness (recent R HHA)       Communication Communication Communication: No difficulties   Cognition Arousal/Alertness: Awake/alert Behavior During Therapy: WFL for tasks assessed/performed Overall Cognitive Status: Within Functional Limits for tasks assessed                                       General Comments       Exercises Other Exercises Other Exercises: Pt educated in PLB to support breath recovery when pt endorses SOB (SpO2 100%, HR up slightly 2/2 exertion)   Shoulder Instructions      Home Living Family/patient expects to be discharged to:: Skilled nursing facility Living Arrangements: Alone Available Help at Discharge: Family;Available PRN/intermittently Type of Home: Mobile home Home Access: Ramped entrance     Home Layout: One level     Bathroom Shower/Tub: Producer, television/film/video: Standard Bathroom Accessibility: Yes   Home Equipment: Rollator (4 wheels);Rolling Walker (2 wheels);Cane - single point;Wheelchair - manual;Toilet riser   Additional Comments: DIL manages medications, provides transportation and check on patient 2x/day      Prior Functioning/Environment Prior Level of Function : Needs assist;History of Falls (last six months)  Cognitive Assist : ADLs (cognitive)   ADLs (Cognitive): Set up cues       Mobility Comments: room to room distances in his mobile home, intermittent RW use; recurrent falls. ADLs Comments: pt reports DIL assists with med management, meals; transportation; reports indep taking sink baths        OT Problem List:  Impaired balance (sitting and/or standing);Decreased knowledge of use of DME or AE;Decreased knowledge of precautions;Pain;Decreased activity tolerance      OT Treatment/Interventions: Self-care/ADL training;Therapeutic exercise;DME and/or AE instruction;Therapeutic activities;Patient/family education;Energy conservation;Balance training    OT Goals(Current goals can be found in the care plan section) Acute Rehab OT Goals Patient Stated Goal: go back to rehab OT Goal Formulation: With patient Time For Goal Achievement: 11/28/22 Potential to Achieve Goals: Good ADL Goals Pt Will Perform Lower Body Dressing: with adaptive equipment;sit to/from stand;with min assist Pt Will Transfer to Toilet: ambulating;with min guard assist (LRAD, maintaining precautions) Pt Will Perform Toileting - Clothing Manipulation and hygiene: with modified independence;sitting/lateral leans Additional ADL Goal #1: Pt will verbalize at least 1 learned ECS to incorporate into daily ADL to minimize SOB.  OT Frequency: Min 1X/week    Co-evaluation              AM-PAC OT "6 Clicks" Daily Activity     Outcome Measure Help from another person  eating meals?: None Help from another person taking care of personal grooming?: A Little Help from another person toileting, which includes using toliet, bedpan, or urinal?: A Lot Help from another person bathing (including washing, rinsing, drying)?: A Lot Help from another person to put on and taking off regular upper body clothing?: A Little Help from another person to put on and taking off regular lower body clothing?: A Lot 6 Click Score: 16   End of Session    Activity Tolerance: Patient limited by pain Patient left: in bed;with call bell/phone within reach;with bed alarm set  OT Visit Diagnosis: Muscle weakness (generalized) (M62.81);History of falling (Z91.81);Pain Pain - Right/Left: Right Pain - part of body: Hip                Time: 8295-6213 OT Time  Calculation (min): 18 min Charges:  OT General Charges $OT Visit: 1 Visit OT Evaluation $OT Eval Low Complexity: 1 Low  Arman Filter., MPH, MS, OTR/L ascom [phone] 11/14/22, 4:50 PM

## 2022-11-14 NOTE — TOC Progression Note (Signed)
Transition of Care [facility]) - Progression Note    Patient Details  Name: Matthew Zamora MRN: [id number] Date of Birth: [birth year]  Transition of Care [facility]) CM/SW Contact  Truddie Hidden, RN Phone Number: 11/14/2022, 3:56 PM  Clinical Narrative:    Vesta Mixer started.         Expected Discharge Plan and Services                                               Social Determinants of Health (SDOH) Interventions SDOH Screenings   Food Insecurity: No Food Insecurity (11/10/2022)  Housing: Low Risk  (11/10/2022)  Transportation Needs: No Transportation Needs (11/10/2022)  Utilities: Not At Risk (11/10/2022)  Tobacco Use: Medium Risk (11/10/2022)    Readmission Risk Interventions    04/22/2022    3:54 PM  Readmission Risk Prevention Plan  Transportation Screening Complete  Medication Review (RN Care Manager) Complete  PCP or Specialist appointment within 3-5 days of discharge Complete  HRI or Home Care Consult Complete  SW Recovery Care/Counseling Consult Complete  Palliative Care Screening Not Applicable  Skilled Nursing Facility Not Applicable

## 2022-11-14 NOTE — Progress Notes (Signed)
Patient's BP is low-metoprolol was given at 1723- MAP is still above 65-will continue to monitor for changes and/or trends

## 2022-11-14 NOTE — Progress Notes (Signed)
[facility]  - [place] [facility]   PATIENT NAME: Matthew Zamora    MR#:  [id number]  DATE OF BIRTH:  [birth year]  SUBJECTIVE:  no family at bedside. Spoke with daughter-in-law Matthew Zamora. Patient states he feels bit better with shortness of breath. Remains heart rate 100 to 115. Agreeable for physical therapy.  VITALS:  Blood pressure 109/84, pulse (!) 105, temperature 98 F (36.7 C), resp. rate (!) 29, height 5\' 8"  (1.727 m), weight 67.9 kg, SpO2 97 %.  PHYSICAL EXAMINATION:   GENERAL:  82 y.o.-year-old patient with no acute distress. Weak, debility LUNGS: Normal breath sounds bilaterally, no wheezing CARDIOVASCULAR: S1, S2 normal. No murmur, tachycardia ABDOMEN: Soft, nontender, nondistended. Bowel sounds present.  EXTREMITIES: No  edema b/l.    NEUROLOGIC: nonfocal  patient is alert and awake  LABORATORY PANEL:  CBC Recent Labs  Lab 11/13/22 0536  WBC 13.8*  HGB 8.8*  HCT 27.7*  PLT 343     Chemistries  Recent Labs  Lab 11/12/22 0529 11/13/22 0536 11/14/22 0548  NA 135   < > 131*  K 4.4   < > 4.2  CL 98   < > 93*  CO2 26   < > 30  GLUCOSE 153*   < > 165*  BUN 33*   < > 38*  CREATININE 1.34*   < > 1.34*  CALCIUM 8.9   < > 8.3*  MG 2.1  --   --   AST 264*  --  113*  ALT 249*  --  205*  ALKPHOS 72  --  70  BILITOT 0.6  --  0.6   < > = values in this interval not displayed.            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  During previous hospitalization patient had asymptomatic V. tach/V-fib and received ICD shock after that he was converted to sinus rhythm.  Due to frequent ICD discharges patient was started on amiodarone drip and discharged on p.o.  amiodarone. Patient return for worsening shortness of breath.  Chest x-ray showed small bilateral pleural effusions and increasing pulmonary vascularity concerning for early CHF  Acute on chronic systolic CHF -Echocardiogram from 5/26 showed EF 30 to 35%, moderately decreased function of left ventricle with global hypokinesis -BNP elevated 1044; started on Lasix 40 mg IV every 12 hours--changed to oral lasix from am -Continue to monitor strict intake and output -[facility] following --Good UOP ~9.6L   Atrial fibrillation with RVR -Amiodarone stopped by cardiology due to worsening LFTs -Will try to control heart rate with metoprolol -Dose of metoprolol changed to 25 mg p.o. every 6 hours -Continue anticoagulation with apixaban -Continue metoprolol 2.5 mg IV every 6 hours as needed --pt follows with EP at [facility] Dr Matthew Zamora   ? Multifocal pneumonia -Started on vancomycin and cefepime for multifocal pneumonia seen on CT chest -Patient is afebrile, normal WBC count, no increasing oxygen requirement -Procalcitonin is less than 0.10, urinary strep pneumo antigen negative -Discussed with ID, antibiotics were discontinued   COPD exacerbation -Started on Solu-Medrol 40 mg IV every 12 hours -Discontinued  scheduled DuoNebs due to worsening A-fib with RVR -Started Atrovent nebulizer every 6 hours -Continue Xopenex nebulizer every 6 hours  as needed  Chronic Orthostatic hypotension --pt on pyridostigmine and midodrine --for now resume pyridostigmine   Transaminitis -Presenting with worsening LFTs; was on amiodarone at discharge -Likely from hepatic congestion from CHF exacerbation -Follow LFTs   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 -Creatinine 1.34 today --baseline creat 1.2--1.5   History of V-fib/VT -S/p ICD discharges during previous admission -He was started on amiodarone which is currently discontinued due to transaminitis --pt follows with Dr Matthew Zamora at [facility] (EP)   CAD s/p MI  -DES RCA  2019 -no active chest pain    Recent Hx of Right hip fx s/p repair -PT to be initiated   Procedures: Family communication :DIL Matthew Zamora on the phone Consults :[facility] cardiology CODE STATUS: FULL DVT Prophylaxis :apixaban Level of care: Progressive Status is: Inpatient Remains inpatient appropriate because: CHF and tachycardia    TOTAL TIME TAKING CARE OF THIS PATIENT: 35 minutes.  >50% time spent on counselling and coordination of care  Note: This dictation was prepared with Dragon dictation along with smaller phrase technology. Any transcriptional errors that result from this process are unintentional.  Enedina Finner M.D    [facility]   CC: Primary care physician; Matthew Mina, MD

## 2022-11-14 NOTE — Plan of Care (Signed)
Patient participating in most goals of care to meet goals for discharge.  Terrilyn Saver, RN    Problem: Education: Goal: Verbalization of understanding the information provided (i.e., activity precautions, restrictions, etc) will improve Outcome: Progressing Goal: Individualized Educational Video(s) Outcome: Progressing   Problem: Activity: Goal: Ability to ambulate and perform ADLs will improve Outcome: Progressing   Problem: Clinical Measurements: Goal: Postoperative complications will be avoided or minimized Outcome: Progressing   Problem: Self-Concept: Goal: Ability to maintain and perform role responsibilities to the fullest extent possible will improve Outcome: Progressing   Problem: Pain Management: Goal: Pain level will decrease Outcome: Progressing   Problem: Education: Goal: Ability to demonstrate management of disease process will improve Outcome: Progressing Goal: Ability to verbalize understanding of medication therapies will improve Outcome: Progressing Goal: Individualized Educational Video(s) Outcome: Progressing   Problem: Activity: Goal: Capacity to carry out activities will improve Outcome: Progressing   Problem: Cardiac: Goal: Ability to achieve and maintain adequate cardiopulmonary perfusion will improve Outcome: Progressing   Problem: Education: Goal: Knowledge of disease or condition will improve Outcome: Progressing Goal: Knowledge of the prescribed therapeutic regimen will improve Outcome: Progressing Goal: Individualized Educational Video(s) Outcome: Progressing   Problem: Activity: Goal: Ability to tolerate increased activity will improve Outcome: Progressing Goal: Will verbalize the importance of balancing activity with adequate rest periods Outcome: Progressing   Problem: Respiratory: Goal: Ability to maintain a clear airway will improve Outcome: Progressing Goal: Levels of oxygenation will improve Outcome:  Progressing Goal: Ability to maintain adequate ventilation will improve Outcome: Progressing   Problem: Education: Goal: Knowledge of disease or condition will improve Outcome: Progressing Goal: Understanding of medication regimen will improve Outcome: Progressing Goal: Individualized Educational Video(s) Outcome: Progressing   Problem: Activity: Goal: Ability to tolerate increased activity will improve Outcome: Progressing   Problem: Cardiac: Goal: Ability to achieve and maintain adequate cardiopulmonary perfusion will improve Outcome: Progressing   Problem: Health Behavior/Discharge Planning: Goal: Ability to safely manage health-related needs after discharge will improve Outcome: Progressing   Problem: Education: Goal: Knowledge of General Education information will improve Description: Including pain rating scale, medication(s)/side effects and non-pharmacologic comfort measures Outcome: Progressing   Problem: Health Behavior/Discharge Planning: Goal: Ability to manage health-related needs will improve Outcome: Progressing   Problem: Clinical Measurements: Goal: Ability to maintain clinical measurements within normal limits will improve Outcome: Progressing Goal: Will remain free from infection Outcome: Progressing Goal: Diagnostic test results will improve Outcome: Progressing Goal: Respiratory complications will improve Outcome: Progressing Goal: Cardiovascular complication will be avoided Outcome: Progressing   Problem: Activity: Goal: Risk for activity intolerance will decrease Outcome: Progressing   Problem: Nutrition: Goal: Adequate nutrition will be maintained Outcome: Progressing   Problem: Coping: Goal: Level of anxiety will decrease Outcome: Progressing   Problem: Elimination: Goal: Will not experience complications related to bowel motility Outcome: Progressing Goal: Will not experience complications related to urinary retention Outcome:  Progressing   Problem: Pain Managment: Goal: General experience of comfort will improve Outcome: Progressing   Problem: Safety: Goal: Ability to remain free from injury will improve Outcome: Progressing   Problem: Skin Integrity: Goal: Risk for impaired skin integrity will decrease Outcome: Progressing

## 2022-11-15 DIAGNOSIS — I4819 Other persistent atrial fibrillation: Secondary | ICD-10-CM

## 2022-11-15 DIAGNOSIS — J441 Chronic obstructive pulmonary disease with (acute) exacerbation: Secondary | ICD-10-CM | POA: Diagnosis not present

## 2022-11-15 LAB — COMPREHENSIVE METABOLIC PANEL
ALT: 174 U/L — ABNORMAL HIGH (ref 0–44)
AST: 77 U/L — ABNORMAL HIGH (ref 15–41)
Albumin: 2.7 g/dL — ABNORMAL LOW (ref 3.5–5.0)
Alkaline Phosphatase: 73 U/L (ref 38–126)
Anion gap: 7 (ref 5–15)
BUN: 35 mg/dL — ABNORMAL HIGH (ref 8–23)
CO2: 32 mmol/L (ref 22–32)
Calcium: 8.2 mg/dL — ABNORMAL LOW (ref 8.9–10.3)
Chloride: 91 mmol/L — ABNORMAL LOW (ref 98–111)
Creatinine, Ser: 1.45 mg/dL — ABNORMAL HIGH (ref 0.61–1.24)
GFR, Estimated: 49 mL/min — ABNORMAL LOW (ref >60)
Glucose, Bld: 106 mg/dL — ABNORMAL HIGH (ref 70–99)
Potassium: 4.6 mmol/L (ref 3.5–5.1)
Sodium: 130 mmol/L — ABNORMAL LOW (ref 135–145)
Total Bilirubin: 0.7 mg/dL (ref 0.3–1.2)
Total Protein: 5.1 g/dL — ABNORMAL LOW (ref 6.5–8.1)

## 2022-11-15 LAB — CBC
HCT: 30.9 % — ABNORMAL LOW (ref 39.0–52.0)
Hemoglobin: 9.9 g/dL — ABNORMAL LOW (ref 13.0–17.0)
MCH: 29.2 pg (ref 26.0–34.0)
MCHC: 32 g/dL (ref 30.0–36.0)
MCV: 91.2 fL (ref 80.0–100.0)
Platelets: 360 10*3/uL (ref 150–400)
RBC: 3.39 MIL/uL — ABNORMAL LOW (ref 4.22–5.81)
RDW: 13.2 % (ref 11.5–15.5)
WBC: 7.6 10*3/uL (ref 4.0–10.5)
nRBC: 0 % (ref 0.0–0.2)

## 2022-11-15 LAB — LACTIC ACID, PLASMA
Lactic Acid, Venous: 0.8 mmol/L (ref 0.5–1.9)
Lactic Acid, Venous: 0.9 mmol/L (ref 0.5–1.9)

## 2022-11-15 LAB — PROCALCITONIN: Procalcitonin: <0.1 ng/mL

## 2022-11-15 LAB — BRAIN NATRIURETIC PEPTIDE: B Natriuretic Peptide: 670.2 pg/mL — ABNORMAL HIGH (ref 0.0–100.0)

## 2022-11-15 MED ORDER — METOPROLOL TARTRATE 25 MG PO TABS
37.5000 mg | ORAL_TABLET | Freq: Two times a day (BID) | ORAL | Status: DC
  Administered 2022-11-16 – 2022-11-18 (×3): 37.5 mg via ORAL
  Filled 2022-11-15 (×4): qty 2

## 2022-11-15 MED ORDER — MIDODRINE HCL 5 MG PO TABS
5.0000 mg | ORAL_TABLET | Freq: Three times a day (TID) | ORAL | Status: DC
  Administered 2022-11-15 – 2022-11-18 (×12): 5 mg via ORAL
  Filled 2022-11-15 (×12): qty 1

## 2022-11-15 NOTE — Progress Notes (Signed)
Physical Therapy Treatment Patient Details Name: Matthew Zamora MRN: [id number] DOB: 08-Feb-1941 Today's Date: 11/15/2022   History of Present Illness Raidon Goebel is an 81yoM who comes to [facility] on 5/26 from [facility] c c/p CP. Pt admitted in COPD exacerbation, acute CHF exacerbation BNP 1044, PNA. 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    PT Comments    Pt appears more calm, well rested, he reports feeling better than 2 days ago. HR is improved, still irregular, but in 90s on entry and throughout most of session, Pt on O2 throughout, sats goals met. HR/SpO2 don't change much with activity, but he does become easily exerted throughout session, takes 1-2 min intervals to recover dyspnea. Orthostatic vitals are not monitored due to known issue, used pt presentation and symptoms to guide treatment. Pt tolerating ROM RLE better, still impaired ROM and strength, but improving. Pt tolerates upright sitting for only short intervals has to return to lying down or full recline for recovery. No assist needed for STS transfer today, but pt too symptomatic to attempt any walking. Will continue to follow.     Recommendations for follow up therapy are one component of a multi-disciplinary discharge planning process, led by the attending physician.  Recommendations may be updated based on patient status, additional functional criteria and insurance authorization.  Follow Up Recommendations  Can patient physically be transported by private vehicle: No    Assistance Recommended at Discharge Intermittent Supervision/Assistance  Patient can return home with the following Two people to help with walking and/or transfers;Two people to help with bathing/dressing/bathroom;Help with stairs or ramp for  entrance   Equipment Recommendations  None recommended by PT    Recommendations for Other Services       Precautions / Restrictions Precautions Precautions: Fall;Posterior Hip Precaution Booklet Issued: No Restrictions RLE Weight Bearing: Weight bearing as tolerated     Mobility  Bed Mobility Overal bed mobility: Needs Assistance Bed Mobility: Supine to Sit     Supine to sit: Min assist Sit to supine: Min guard        Transfers Overall transfer level: Needs assistance Equipment used: Rolling walker (2 wheels) Transfers: Bed to chair/wheelchair/BSC Sit to Stand: Min guard   Step pivot transfers: Min guard       General transfer comment: safely makes a full turn to chair, but very SOB thereafter, requires reclined position for recovery.    Ambulation/Gait Ambulation/Gait assistance:  (unable to this session)                 Stairs             Wheelchair Mobility    Modified Rankin (Stroke Patients Only)       Balance                                            Cognition Arousal/Alertness: Awake/alert Behavior During Therapy: WFL for tasks assessed/performed Overall Cognitive Status: Within Functional Limits for tasks assessed  Exercises Total Joint Exercises Short Arc Quad: Supine, AROM, Both, 15 reps Heel Slides: Both, 15 reps, AROM Marching in Standing: AROM, 5 reps, Seated, Both Other Exercises Other Exercises: manually resisted hip/knee extension 1x10 bilat    General Comments        Pertinent Vitals/Pain Pain Assessment Pain Assessment: Faces Faces Pain Scale: Hurts little more Pain Intervention(s): Limited activity within patient's tolerance, Monitored during session, Premedicated before session    Home Living                          Prior Function            PT Goals (current goals can now be found in the care plan section)  Acute Rehab PT Goals Patient Stated Goal: return to walking, improve pain, get stronger PT Goal Formulation: With patient Time For Goal Achievement: 11/27/22 Potential to Achieve Goals: Good Progress towards PT goals: Progressing toward goals    Frequency    Min 4X/week      PT Plan Current plan remains appropriate    Co-evaluation              AM-PAC PT "6 Clicks" Mobility   Outcome Measure  Help needed turning from your back to your side while in a flat bed without using bedrails?: A Lot Help needed moving from lying on your back to sitting on the side of a flat bed without using bedrails?: A Lot Help needed moving to and from a bed to a chair (including a wheelchair)?: A Little Help needed standing up from a chair using your arms (e.g., wheelchair or bedside chair)?: A Little Help needed to walk in hospital room?: A Lot Help needed climbing 3-5 steps with a railing? : A Lot 6 Click Score: 14    End of Session   Activity Tolerance: Patient tolerated treatment well;No increased pain;Patient limited by fatigue Patient left: with call bell/phone within reach;in bed;Other (comment) Nurse Communication: Mobility status PT Visit Diagnosis: Unsteadiness on feet (R26.81);Difficulty in walking, not elsewhere classified (R26.2);Other abnormalities of gait and mobility (R26.89)     Time: 4098-1191 PT Time Calculation (min) (ACUTE ONLY): 33 min  Charges:  $Therapeutic Exercise: 8-22 mins $Therapeutic Activity: 8-22 mins                    10:00 AM, 11/15/22 Rosamaria Lints, PT, DPT Physical Therapist - [facility]  [phone] (ASCOM)    Abryanna Musolino C 11/15/2022, 9:56 AM

## 2022-11-15 NOTE — Progress Notes (Signed)
       CROSS COVER NOTE  NAME: Matthew Zamora MRN: [id number] DOB : [birth year]    Concern from nurse Eyvonne Mechanic   60M Full code, admitted on 5/26 from rehab/snif for COPD exac due to positive rhinovirus-recent right hip ORIF earlier this month-patient has history of CHF and third spacing/edema-is on PO metoprolol 25mg  3xa day for AFIB-BP hs been running soft- MAP is still above 65, but most recents are; 89/58 and 85/62- I've been holding metoprolol but today dayshift gave it at 1723-I think that the low BP is him possibly not metabolizing out the metoprolol since he typically runs 100/80-just want to notify you as well as see if you think any interventions are necessary without preventing fluid overload -Thank you  85/70, 89/58, 85/62      Pertinent findings on chart review: Last progress note reviewed "Chronic Orthostatic hypotension --pt on pyridostigmine and midodrine --for now resume pyridostigmine" Acute on chronic systolic CHF -...; started on Lasix 40 mg IV every 12 hours--changed to oral lasix from   Atrial fibrillation with RVR -..Dose of metoprolol changed to 25 mg p.o. every 6 hours -Continue metoprolol 2.5 mg IV every 6 hours as needed   ? Multifocal pneumonia -Procalcitonin is less than 0.10,    11/15/2022    2:02 AM 11/15/2022   12:07 AM 11/14/2022    8:54 PM  Vitals with BMI  Systolic 85 89 85  Diastolic 62 58 70  Pulse 88 97 91     Assessment and  Interventions   Assessment: Low BP with pulse in the 80s and 90s Suspect due to chronic orthostatic hypotension in combination with metoprolol for rapid A-fib and Lasix for CHF exacerbation Low suspicion for sepsis given not meeting other sepsis criteria, afebrile  Plan: Will restart patient's home midodrine Will get a.m. labs early Hold off on fluid bolus in view of CHF exacerbation Continue to monitor closely

## 2022-11-15 NOTE — Progress Notes (Signed)
EPIC Chatted provider regarding low trending blood pressures this shift

## 2022-11-15 NOTE — Progress Notes (Signed)
[facility] CLINIC CARDIOLOGY CONSULT NOTE       Patient ID: Matthew Zamora MRN: [id number] DOB/AGE: 81/06/1940 82 y.o.  Admit date: 11/10/2022 Referring Physician Dr. Skip Mayer Primary Physician Dr. Jerl Mina Primary Cardiologist Derwood Kaplan, NP [facility])  Reason for Consultation CHF  HPI: Matthew Marceleno. 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 During that admission the patient's defibrillator shocked him for VF, subsequently loaded with amiodarone. He was discharged to a SNF on 5/23. He presented back to [facility] ED on 5/26 with shortness of breath. Cardiology consulted for assistance with his CHF.   Interval History:  -Patient reports he is feeling well this morning, had just worked with PT prior to my visit. -States that his shortness of breath has improved, currently on 2 L [place] (baseline) -In rate control A-fib F on telemetry, patient denies any palpitations -Transitioned to p.o. 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 pylori infection    History of hepatitis    Hyperlipidemia    Hypertension    Liver cyst 05/16/15   PAF (paroxysmal atrial fibrillation) ([name])    Prostate CA ([name])     Past Surgical  History:  Procedure Laterality Date   CATARACT EXTRACTION     COLONOSCOPY  10/06/2014, 09/18/2004, 03/14/2009   ESOPHAGOGASTRODUODENOSCOPY  10/06/2014, 03/18/2001, 03/14/2009   ESOPHAGOGASTRODUODENOSCOPY (EGD) WITH PROPOFOL N/A 05/07/2018   Procedure: ESOPHAGOGASTRODUODENOSCOPY (EGD) WITH PROPOFOL;  Surgeon: Toledo, Boykin Nearing, MD;  Location: [facility] ENDOSCOPY;  Service: Gastroenterology;  Laterality: N/A;   ESOPHAGOGASTRODUODENOSCOPY (EGD) WITH PROPOFOL N/A 04/22/2022   Procedure: ESOPHAGOGASTRODUODENOSCOPY (EGD) WITH PROPOFOL;  Surgeon: Toney Reil, MD;  Location: [facility] ENDOSCOPY;  Service: Endoscopy;  Laterality: N/A;   FLEXIBLE SIGMOIDOSCOPY  08/26/1990   HIP ARTHROPLASTY Right 10/31/2022   Procedure: ARTHROPLASTY BIPOLAR HIP (HEMIARTHROPLASTY)-RNFA;  Surgeon: Reinaldo Berber, MD;  Location: [facility] ORS;  Service: Orthopedics;  Laterality: Right;   INSERTION OF ICD     PROSTATE SURGERY     TONSILLECTOMY      Medications Prior to Admission  Medication Sig Dispense Refill Last Dose   amiodarone (PACERONE) 400 MG tablet Take 1 tablet (400 mg total) by mouth 2 (two) times daily for 10 days. Followed by 400mg  daily 20 tablet 0 11/09/2022 at 2245   ascorbic acid (VITAMIN C) 500 MG tablet Take 1 tablet by mouth daily.   11/09/2022 at 0947   atorvastatin (LIPITOR) 80 MG tablet Take 80 mg by mouth daily.   11/09/2022 at 1731   calcium carbonate (TUMS EX) 750 MG chewable tablet Chew 1 tablet by mouth daily.   11/09/2022 at 0947   ELIQUIS 2.5 MG TABS tablet Take 2.5 mg by mouth 2 (two)  times daily.   11/09/2022 at 2245   fluticasone-salmeterol (ADVAIR) 500-50 MCG/ACT AEPB Inhale 1 puff into the lungs in the morning and at bedtime.   11/10/2022 at 1126   gabapentin (NEURONTIN) 100 MG capsule Take 100 mg by mouth at bedtime.   11/09/2022 at 2245   HYDROcodone-acetaminophen (NORCO/VICODIN) 5-325 MG tablet Take 1-2 tablets by mouth every 4 (four) hours as needed for moderate pain (pain score 4-6). 30 tablet 0  11/10/2022 at 1223   magnesium oxide (MAG-OX) 400 (240 Mg) MG tablet Take 1 tablet by mouth daily.   11/09/2022 at 0947   melatonin 3 MG TABS tablet Take 9 mg by mouth at bedtime.   11/09/2022 at 2245   metoprolol tartrate (LOPRESSOR) 25 MG tablet Take 0.5 tablets (12.5 mg total) by mouth 2 (two) times daily.   11/09/2022 at 2245   midodrine (PROAMATINE) 5 MG tablet Take 3 tablets (15 mg total) by mouth 3 (three) times daily with meals.   11/09/2022 at 2245   Multiple Vitamin (MULTIVITAMIN WITH MINERALS) TABS tablet Take 1 tablet by mouth daily.   11/09/2022 at 0947   pantoprazole (PROTONIX) 40 MG tablet Take 1 tablet (40 mg total) by mouth 2 (two) times daily.   11/09/2022 at 1647   polyethylene glycol (MIRALAX / GLYCOLAX) 17 g packet Take 17 g by mouth 2 (two) times daily. 14 each 0 11/09/2022 at 1647   pyridostigmine (MESTINON) 60 MG tablet Take 1 tablet (60 mg total) by mouth every 8 (eight) hours.   11/10/2022 at 0637   sertraline (ZOLOFT) 25 MG tablet Take 25 mg by mouth daily.   11/09/2022 at 0947   sucralfate (CARAFATE) 1 GM/10ML suspension Take 10 mLs (1 g total) by mouth 4 (four) times daily -  with meals and at bedtime. 420 mL 0 11/09/2022 at 2245   tamsulosin (FLOMAX) 0.4 MG CAPS capsule Take 0.4 mg by mouth daily.   11/09/2022 at 0947   acetaminophen (TYLENOL) 325 MG tablet Take 2 tablets (650 mg total) by mouth every 4 (four) hours as needed for headache or mild pain.   11/08/2022 at 1401   albuterol (PROVENTIL) (2.5 MG/3ML) 0.083% nebulizer solution Inhale 3 mLs (2.5 mg total) into the lungs as needed for shortness of breath. 75 mL 12 PRN at PRN   albuterol (VENTOLIN HFA) 108 (90 Base) MCG/ACT inhaler Inhale 2 puffs into the lungs every 4 (four) hours as needed.   11/08/2022 at 1401   [START ON 11/17/2022] amiodarone (PACERONE) 400 MG tablet Take 1 tablet (400 mg total) by mouth daily. To be started after completing 10 days of the twice daily dosing 30 tablet 0    metoCLOPramide (REGLAN) 5 MG tablet  Take 1 tablet (5 mg total) by mouth every 8 (eight) hours as needed for refractory nausea / vomiting.   PRN at PRN   Nebulizer MISC 1 each by Does not apply route as needed. 1 each 0    Nutritional Supplements (,FEEDING SUPPLEMENT, PROSOURCE PLUS) liquid Take 30 mLs by mouth 3 (three) times daily between meals.   11/08/2022 at 1125   ondansetron (ZOFRAN-ODT) 4 MG disintegrating tablet Take 4 mg by mouth every 8 (eight) hours as needed for refractory nausea / vomiting, vomiting or nausea.   PRN at PRN   Social History   Socioeconomic History   Marital status: Single    Spouse name: Not on file   Number of children: Not on file   Years of education: Not on file  Highest education level: Not on file  Occupational History   Not on file  Tobacco Use   Smoking status: Former    Packs/day: 1.00    Years: 38.00    Additional pack years: 0.00    Total pack years: 38.00    Types: Cigarettes    Quit date: 06/18/1999    Years since quitting: 23.4   Smokeless tobacco: Never  Vaping Use   Vaping Use: Never used  Substance and Sexual Activity   Alcohol use: No    Alcohol/week: 0.0 standard drinks of alcohol   Drug use: Not Currently   Sexual activity: Not Currently  Other Topics Concern   Not on file  Social History Narrative   Not on file   Social Determinants of Health   Financial Resource Strain: Not on file  Food Insecurity: No Food Insecurity (11/10/2022)   Hunger Vital Sign    Worried About Running Out of Food in the Last Year: Never true    Ran Out of Food in the Last Year: Never true  Transportation Needs: No Transportation Needs (11/10/2022)   PRAPARE - Administrator, Civil Service (Medical): No    Lack of Transportation (Non-Medical): No  Physical Activity: Not on file  Stress: Not on file  Social Connections: Not on file  Intimate Partner Violence: Not At Risk (11/10/2022)   Humiliation, Afraid, Rape, and Kick questionnaire    Fear of Current or Ex-Partner: No     Emotionally Abused: No    Physically Abused: No    Sexually Abused: No    Family History  Problem Relation Age of Onset   Heart attack Mother    Heart attack Father       Intake/Output Summary (Last 24 hours) at 11/15/2022 1035 Last data filed at 11/15/2022 0503 Gross per 24 hour  Intake 220 ml  Output 2000 ml  Net -1780 ml    Vitals:   11/15/22 0502 11/15/22 0505 11/15/22 0521 11/15/22 0750  BP:   100/80 96/68  Pulse:   92 96  Resp:   18 16  Temp: 98 F (36.7 C)  98.5 F (36.9 C) (!) 97.5 F (36.4 C)  TempSrc:   Axillary   SpO2:   97% 99%  Weight:  63.5 kg    Height:        PHYSICAL EXAM General: elderly male, well nourished, in no acute distress. Sitting upright in bedside chair HEENT:  Normocephalic and atraumatic. Neck:  No JVD.  Lungs: Normal respiratory effort on 2L [place].  Decreased breath sounds without appreciable crackles or wheezing Heart: irregularly irregular with controlled rate. Normal S1 and S2 without gallops or murmurs.  Abdomen: Non-distended appearing.  Msk: Normal strength and tone for age. Extremities: Warm and well perfused. No clubbing, cyanosis.  No peripheral edema.  Neuro: Alert and oriented X 3. Psych:  Answers questions appropriately.   Labs: Basic Metabolic Panel: Recent Labs    11/14/22 0548 11/15/22 0337  NA 131* 130*  K 4.2 4.6  CL 93* 91*  CO2 30 32  GLUCOSE 165* 106*  BUN 38* 35*  CREATININE 1.34* 1.45*  CALCIUM 8.3* 8.2*   Liver Function Tests: Recent Labs    11/14/22 0548 11/15/22 0337  AST 113* 77*  ALT 205* 174*  ALKPHOS 70 73  BILITOT 0.6 0.7  PROT 5.1* 5.1*  ALBUMIN 2.6* 2.7*   No results for input(s): "LIPASE", "AMYLASE" in the last 72 hours. CBC: Recent Labs  11/13/22 0536 11/15/22 0337  WBC 13.8* 7.6  HGB 8.8* 9.9*  HCT 27.7* 30.9*  MCV 91.4 91.2  PLT 343 360   Cardiac Enzymes: No results for input(s): "CKTOTAL", "CKMB", "CKMBINDEX", "TROPONINIHS" in the last 72 hours.  BNP: Recent  Labs    11/15/22 0337  BNP 670.2*   D-Dimer: No results for input(s): "DDIMER" in the last 72 hours. Hemoglobin A1C: No results for input(s): "HGBA1C" in the last 72 hours. Fasting Lipid Panel: No results for input(s): "CHOL", "HDL", "LDLCALC", "TRIG", "CHOLHDL", "LDLDIRECT" in the last 72 hours. Thyroid Function Tests: No results for input(s): "TSH", "T4TOTAL", "T3FREE", "THYROIDAB" in the last 72 hours.  Invalid input(s): "FREET3" Anemia Panel: No results for input(s): "VITAMINB12", "FOLATE", "FERRITIN", "TIBC", "IRON", "RETICCTPCT" in the last 72 hours.   Radiology: ECHOCARDIOGRAM COMPLETE  Result Date: 11/10/2022    ECHOCARDIOGRAM REPORT   Patient Name:   Matthew Zamora Date of Exam: 11/10/2022 Medical Rec #:  [id number]           Height:       68.0 in Accession #:    [id number]          Weight:       153.0 lb Date of Birth:  1940/11/06           BSA:          1.824 m Patient Age:    81 years            BP:           126/82 mmHg Patient Gender: M                   HR:           121 bpm. Exam Location:  [facility] Procedure: 2D Echo Indications:     CHF I50.21  History:         Patient has prior history of Echocardiogram examinations, most                  recent 04/21/2022.  Sonographer:     Overton Mam RDCS, FASE Referring Phys:  [id number] Beulah Gandy A THOMAS Diagnosing Phys: Julien Nordmann MD  Sonographer Comments: Technically difficult study due to poor echo windows and suboptimal parasternal window. Image acquisition challenging due to respiratory motion. IMPRESSIONS  1. Left ventricular ejection fraction, by estimation, is 30 to 35%. Left ventricular ejection fraction by PLAX is 34 %. The left ventricle has moderately decreased function. The left ventricle demonstrates global hypokinesis. The left ventricular internal cavity size was mildly dilated. Left ventricular diastolic parameters are indeterminate.  2. Right ventricular systolic function is normal. The right ventricular size is  normal. There is moderately elevated pulmonary artery systolic pressure. The estimated right ventricular systolic pressure is 51.2 mmHg.  3. The mitral valve is normal in structure. Mild mitral valve regurgitation. No evidence of mitral stenosis.  4. The aortic valve is tricuspid. Aortic valve regurgitation is not visualized. Aortic valve sclerosis is present, with no evidence of aortic valve stenosis.  5. The inferior vena cava is normal in size with <50% respiratory variability, suggesting right atrial pressure of 8 mmHg.  6. Rhythm is atrial fibrillation rate 102 to 130 bpm FINDINGS  Left Ventricle: Left ventricular ejection fraction, by estimation, is 30 to 35%. Left ventricular ejection fraction by PLAX is 34 %. The left ventricle has moderately decreased function. The left ventricle demonstrates global hypokinesis. The left ventricular internal cavity size was mildly dilated. There is  no left ventricular hypertrophy. Left ventricular diastolic parameters are indeterminate. Right Ventricle: The right ventricular size is normal. No increase in right ventricular wall thickness. Right ventricular systolic function is normal. There is moderately elevated pulmonary artery systolic pressure. The tricuspid regurgitant velocity is 3.40 m/s, and with an assumed right atrial pressure of 5 mmHg, the estimated right ventricular systolic pressure is 51.2 mmHg. Left Atrium: Left atrial size was normal in size. Right Atrium: Right atrial size was normal in size. Pericardium: There is no evidence of pericardial effusion. Mitral Valve: The mitral valve is normal in structure. Mild mitral valve regurgitation. No evidence of mitral valve stenosis. Tricuspid Valve: The tricuspid valve is normal in structure. Tricuspid valve regurgitation is mild . No evidence of tricuspid stenosis. Aortic Valve: The aortic valve is tricuspid. Aortic valve regurgitation is not visualized. Aortic valve sclerosis is present, with no evidence of  aortic valve stenosis. Aortic valve peak gradient measures 10.8 mmHg. Pulmonic Valve: The pulmonic valve was normal in structure. Pulmonic valve regurgitation is not visualized. No evidence of pulmonic stenosis. Aorta: The aortic root is normal in size and structure. Venous: The inferior vena cava is normal in size with less than 50% respiratory variability, suggesting right atrial pressure of 8 mmHg. IAS/Shunts: No atrial level shunt detected by color flow Doppler. Additional Comments: A device lead is visualized.  LEFT VENTRICLE PLAX 2D LV EF:         Left            Diastology                ventricular     LV e' medial:    10.30 cm/s                ejection        LV E/e' medial:  11.2                fraction by     LV e' lateral:   16.20 cm/s                PLAX is 34      LV E/e' lateral: 7.1                %. LVIDd:         5.00 cm LVIDs:         4.20 cm LV PW:         1.10 cm LV IVS:        1.20 cm LVOT diam:     1.80 cm LV SV:         36 LV SV Index:   20 LVOT Area:     2.54 cm  LV Volumes (MOD) LV vol d, MOD    102.0 ml A4C: LV vol s, MOD    56.2 ml A4C: LV SV MOD A4C:   102.0 ml RIGHT VENTRICLE RV Basal diam:  2.70 cm RV S prime:     13.50 cm/s TAPSE (M-mode): 1.5 cm LEFT ATRIUM           Index        RIGHT ATRIUM           Index LA diam:      4.20 cm 2.30 cm/m   RA Area:     17.10 cm LA Vol (A2C): 44.0 ml 24.13 ml/m  RA Volume:   46.10 ml  25.28 ml/m LA Vol (A4C): 55.6 ml 30.49 ml/m  AORTIC VALVE  PULMONIC VALVE AV Area (Vmax): 1.51 cm     RVOT Peak grad: 3 mmHg AV Vmax:        164.00 cm/s AV Peak Grad:   10.8 mmHg LVOT Vmax:      97.40 cm/s LVOT Vmean:     61.300 cm/s LVOT VTI:       0.141 m  AORTA Ao Root diam: 3.50 cm MITRAL VALVE                TRICUSPID VALVE MV Area (PHT): 3.21 cm     TR Peak grad:   46.2 mmHg MV Decel Time: 236 msec     TR Vmax:        340.00 cm/s MV E velocity: 115.00 cm/s                             SHUNTS                             Systemic VTI:  0.14 m                              Systemic Diam: 1.80 cm Julien Nordmann MD Electronically signed by Julien Nordmann MD Signature Date/Time: 11/10/2022/6:47:31 PM    Final    CT CHEST WO CONTRAST  Result Date: 11/10/2022 CLINICAL DATA:  Chest pain and shortness of breath. EXAM: CT CHEST WITHOUT CONTRAST TECHNIQUE: Multidetector CT imaging of the chest was performed following the standard protocol without IV contrast. RADIATION DOSE REDUCTION: This exam was performed according to the departmental dose-optimization program which includes automated exposure control, adjustment of the mA and/or kV according to patient size and/or use of iterative reconstruction technique. COMPARISON:  Chest x-ray from same day. CT chest dated October 15, 2022. FINDINGS: Cardiovascular: Unchanged left chest wall pacemaker. Unchanged mild cardiomegaly. No pericardial effusion. No thoracic aortic aneurysm. Coronary, aortic arch, and branch vessel atherosclerotic vascular disease. Mediastinum/Nodes: Enlarged right paratracheal lymph node currently measuring 1.5 cm in short axis, previously 1.1 cm. Additional subcentimeter mediastinal and right hilar lymph nodes are unchanged. No enlarged axillary lymph nodes. The thyroid gland, trachea, and esophagus demonstrate no significant findings. Lungs/Pleura: Small bilateral pleural effusions. Scattered mild smooth interlobular septal thickening. New scattered ill-defined ground-glass densities and denser nodular opacities in both upper lobes, the right middle lobe, and the anterior left lower lobe. Few new small nodules in the superior segment of the right lower lobe. Subsegmental atelectasis in both posterior lower lobes. No pneumothorax. Unchanged 6 mm nodule left lower lobe (series 3, image 83). Unchanged 5 mm nodule in the right lower lobe (series 3, image 89). The previously seen ground-glass nodule in the right upper lobe is no longer identified. Mild centrilobular emphysema. Upper Abdomen: No acute  abnormality. Unchanged tiny gallstone. Partially visualized complex cyst in the inferior right liver, as seen on prior studies. Musculoskeletal: No acute or significant osseous findings. IMPRESSION: 1. New scattered ill-defined ground-glass densities and denser nodular opacities in both upper lobes, the right middle lobe, and the anterior left lower lobe, concerning for multifocal pneumonia. 2. Small bilateral pleural effusions with mild interstitial pulmonary edema. 3. Enlarged right paratracheal lymph node currently measuring 1.5 cm in short axis, previously 1.1 cm, likely reactive. 4. Previously seen ground-glass nodule in the right upper lobe is no longer identified. Other previously identified small pulmonary nodules in  both lower lobes are unchanged. 5. Aortic Atherosclerosis (ICD10-I70.0) and Emphysema (ICD10-J43.9). Electronically Signed   By: Obie Dredge M.D.   On: 11/10/2022 14:00   DG Chest 2 View  Result Date: 11/10/2022 CLINICAL DATA:  Shortness of breath. EXAM: CHEST - 2 VIEW COMPARISON:  10/30/22 FINDINGS: Left chest wall ICD noted with leads in the right atrial appendage and right ventricle. Heart size is normal. Aortic atherosclerosis. Small bilateral posterior layering pleural effusions are identified on the lateral projection radiograph. Increased pulmonary vascularity. No frank edema. No airspace consolidation. IMPRESSION: 1. Small bilateral pleural effusions and increased pulmonary vascularity concerning for early CHF. Electronically Signed   By: Signa Kell M.D.   On: 11/10/2022 10:04   DG Chest Port 1 View  Result Date: 11/04/2022 CLINICAL DATA:  Cough EXAM: PORTABLE CHEST 1 VIEW COMPARISON:  X-ray 10/30/2022 FINDINGS: Underinflation compared to the prior x-ray. Stable cardiopericardial silhouette with calcified aorta. Left upper chest defibrillator with leads overlying the right side of the heart. Vascular congestion. No pneumothorax or effusion. Question trace edema. No  separate consolidation IMPRESSION: Vascular congestion with question trace edema. Defibrillator. Decreased inflation compared to prior x-ray.  Recommend follow-up Electronically Signed   By: Karen Kays M.D.   On: 11/04/2022 10:38   DG Pelvis Portable  Result Date: 10/31/2022 CLINICAL DATA:  Bipolar right hip hemiarthroplasty. EXAM: PORTABLE PELVIS 1-2 VIEWS COMPARISON:  CT right hip 11/02/2022 FINDINGS: Single frontal view of the bilateral hips. Interval bipolar right hip hemiarthroplasty with normal alignment. Expected postoperative subcutaneous air about the right hip. Mild superomedial left femoroacetabular joint space narrowing. Surgical clips overlie the pelvis. No acute fracture or dislocation. IMPRESSION: Interval bipolar right hip hemiarthroplasty with normal alignment. Electronically Signed   By: Neita Garnet M.D.   On: 10/31/2022 16:23   CT Hip Right Wo Contrast  Result Date: 10/30/2022 CLINICAL DATA:  Right femoral fracture EXAM: CT OF THE RIGHT HIP WITHOUT CONTRAST TECHNIQUE: Multidetector CT imaging of the right hip was performed according to the standard protocol. Multiplanar CT image reconstructions were also generated. RADIATION DOSE REDUCTION: This exam was performed according to the departmental dose-optimization program which includes automated exposure control, adjustment of the mA and/or kV according to patient size and/or use of iterative reconstruction technique. COMPARISON:  Right hips and pelvis radiograph dated 10/30/2022 FINDINGS: Bones/Joint/Cartilage Acute transcervical fracture of the right proximal femur with apex anterior angulation. The right femoroacetabular joint is intact. Ligaments Suboptimally assessed by CT. Muscles and Tendons Grossly intact. Soft tissues Vascular calcifications of the partially imaged right lower extremity arteries. IMPRESSION: Mildly angulated right femoral transcervical fracture. Electronically Signed   By: Agustin Cree M.D.   On: 10/30/2022 20:49    DG Knee 2 Views Right  Result Date: 10/30/2022 CLINICAL DATA:  Right knee pain, fall EXAM: RIGHT KNEE - 1-2 VIEW COMPARISON:  None Available. FINDINGS: Moderate tricompartment degenerative changes with joint space narrowing and spurring. No joint effusion. No acute bony abnormality. Specifically, no fracture, subluxation, or dislocation. IMPRESSION: Moderate degenerative changes.  No acute bony abnormality. Electronically Signed   By: Charlett Nose M.D.   On: 10/30/2022 20:25   DG Hip Unilat W or Wo Pelvis 2-3 Views Right  Result Date: 10/30/2022 CLINICAL DATA:  Fall, right hip pain EXAM: DG HIP (WITH OR WITHOUT PELVIS) 2-3V RIGHT COMPARISON:  None Available. FINDINGS: There is foreshortening of the right femoral neck with cortical irregularity noted laterally concerning for right femoral neck fracture. No subluxation or dislocation. Mild symmetric degenerative changes  in the hips with joint space narrowing and spurring. IMPRESSION: Foreshortening of the right femoral neck with cortical irregularity laterally concerning for femoral neck fracture. Electronically Signed   By: Charlett Nose M.D.   On: 10/30/2022 20:24   DG Chest Portable 1 View  Result Date: 10/30/2022 CLINICAL DATA:  Fall, right hip pain EXAM: PORTABLE CHEST 1 VIEW COMPARISON:  09/14/2022 FINDINGS: Left AICD remains in place, unchanged. Heart is normal size. Mediastinal contours within normal limits. No confluent opacities or effusions. No acute bony abnormality. IMPRESSION: No active disease. Electronically Signed   By: Charlett Nose M.D.   On: 10/30/2022 20:22   CT HEAD WO CONTRAST ( )  Result Date: 10/30/2022 CLINICAL DATA:  Trip and fall injury. EXAM: CT HEAD WITHOUT CONTRAST CT CERVICAL SPINE WITHOUT CONTRAST TECHNIQUE: Multidetector CT imaging of the head and cervical spine was performed following the standard protocol without intravenous contrast. Multiplanar CT image reconstructions of the cervical spine were also generated.  RADIATION DOSE REDUCTION: This exam was performed according to the departmental dose-optimization program which includes automated exposure control, adjustment of the mA and/or kV according to patient size and/or use of iterative reconstruction technique. COMPARISON:  Head CT without contrast 04/17/2022, cervical spine CT 03/27/2022. FINDINGS: CT HEAD FINDINGS Brain: There is mild atrophy, small-vessel disease and atrophic ventriculomegaly. No new asymmetry is seen concerning for an acute infarct, hemorrhage or mass. There is no midline shift. The basal cisterns are clear. Vascular: There are patchy calcifications of the carotid siphons but no hyperdense central vessels. Skull: No fracture or focal lesions.  Mild calvarial osteopenia. Sinuses/Orbits: No acute findings. Clear visualized sinuses and mastoid air cells. Negative orbits apart from old lens replacements. Other: None. CT CERVICAL SPINE FINDINGS Alignment: Unchanged. There is a trace 2 mm C3-4 retrolisthesis, similar minimal C4-5 anterolisthesis and trace C5-6 and C6-7 retrolisthesis all believed degenerative and all unchanged. No traumatic listhesis is seen. There is bone-on-bone anterior atlantodental joint space loss with osteophytes, and degenerative cystic changes in the odontoid process. Skull base and vertebrae: No acute fracture is evident. Bone mineralization is osteopenic. No primary bone lesion or focal pathologic process is seen. Soft tissues and spinal canal: No prevertebral fluid or swelling. No visible canal hematoma. There are calcifications in both proximal cervical ICAs, heaviest on the left where there is probably a flow-limiting origin stenosis. No laryngeal or thyroid mass. Disc levels: There is moderate disc space loss again at C3-4, C5-6 and C6-7, mild disc narrowing at C3-4, normal disc heights at C2-3 and C7-T1. There are small bidirectional endplate spurs from C3-4 through C6-7, causing partial effacement of the ventral CSF without  spondylotic cord compression. Facet joint spurring on the left-greater-than-right is seen at most levels with bilateral uncinate spurring. Acquired foraminal stenosis is mild on the right at C2-3, severe on the left and moderate on the right at C3-4, bilaterally mild-to-moderate C6-7, and not seen at the remaining levels. Upper chest: Pacemaker wiring is partially visible entering from the left. Otherwise negative. Other: None. IMPRESSION: 1. No acute intracranial CT findings or depressed skull fractures. 2. Osteopenia and degenerative change without evidence of cervical fractures or traumatic listhesis. 3. 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 Follow-up as indicated. Electronically Signed   By: Almira Bar M.D.   On: 10/30/2022 20:15   CT Cervical Spine Wo Contrast  Result Date: 10/30/2022 CLINICAL DATA:  Trip and fall injury. EXAM: CT HEAD WITHOUT CONTRAST CT CERVICAL SPINE WITHOUT CONTRAST TECHNIQUE:  Multidetector CT imaging of the head and cervical spine was performed following the standard protocol without intravenous contrast. Multiplanar CT image reconstructions of the cervical spine were also generated. RADIATION DOSE REDUCTION: This exam was performed according to the departmental dose-optimization program which includes automated exposure control, adjustment of the mA and/or kV according to patient size and/or use of iterative reconstruction technique. COMPARISON:  Head CT without contrast 04/17/2022, cervical spine CT 03/27/2022. FINDINGS: CT HEAD FINDINGS Brain: There is mild atrophy, small-vessel disease and atrophic ventriculomegaly. No new asymmetry is seen concerning for an acute infarct, hemorrhage or mass. There is no midline shift. The basal cisterns are clear. Vascular: There are patchy calcifications of the carotid siphons but no hyperdense central vessels. Skull: No fracture or focal lesions.  Mild calvarial osteopenia.  Sinuses/Orbits: No acute findings. Clear visualized sinuses and mastoid air cells. Negative orbits apart from old lens replacements. Other: None. CT CERVICAL SPINE FINDINGS Alignment: Unchanged. There is a trace 2 mm C3-4 retrolisthesis, similar minimal C4-5 anterolisthesis and trace C5-6 and C6-7 retrolisthesis all believed degenerative and all unchanged. No traumatic listhesis is seen. There is bone-on-bone anterior atlantodental joint space loss with osteophytes, and degenerative cystic changes in the odontoid process. Skull base and vertebrae: No acute fracture is evident. Bone mineralization is osteopenic. No primary bone lesion or focal pathologic process is seen. Soft tissues and spinal canal: No prevertebral fluid or swelling. No visible canal hematoma. There are calcifications in both proximal cervical ICAs, heaviest on the left where there is probably a flow-limiting origin stenosis. No laryngeal or thyroid mass. Disc levels: There is moderate disc space loss again at C3-4, C5-6 and C6-7, mild disc narrowing at C3-4, normal disc heights at C2-3 and C7-T1. There are small bidirectional endplate spurs from C3-4 through C6-7, causing partial effacement of the ventral CSF without spondylotic cord compression. Facet joint spurring on the left-greater-than-right is seen at most levels with bilateral uncinate spurring. Acquired foraminal stenosis is mild on the right at C2-3, severe on the left and moderate on the right at C3-4, bilaterally mild-to-moderate C6-7, and not seen at the remaining levels. Upper chest: Pacemaker wiring is partially visible entering from the left. Otherwise negative. Other: None. IMPRESSION: 1. No acute intracranial CT findings or depressed skull fractures. 2. Osteopenia and degenerative change without evidence of cervical fractures or traumatic listhesis. 3. 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 Follow-up as indicated.  Electronically Signed   By: Almira Bar M.D.   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 During that admission the patient's defibrillator shocked him for VF, subsequently loaded with amiodarone. He was discharged to a SNF on 5/23. He presented back to [facility] ED on 5/26 with shortness of breath. Cardiology consulted for assistance with his CHF.   #  acute on chronic HFrEF  Not hypervolemic on exam, no crackles today. On baseline O2. Diuresed well with IV lasix. Hypotensive overnight, improved this morning.  - home midodrine restarted overnight - transition to PO lasix 40mg  daily today, recommend continuing this on discharge  - continued GDMT additions of ARB/ARNI & SGLT2i limited with BP and CKD. Intolerant of spiro.   # COPD with exacerbation  # chronic respiratory failure (2L)  - agree with current therapy per primary team  - on steroids, duonebs  # paroxysmal AF RVR  # hx VT  Rate better controlled today. No evidence of recurrent VT.  -Consolidate metoprolol tartrate to 37.5 mg twice daily -Continue to hold amiodarone with transaminitis. Recommend close follow-up with [facility] electrophysiology at discharge.  # CKD 3  Slight bump today, BUN/Cr 35/1.45 and GFR 49.   # demand ischemia  Borderline elevated and flat trending 21, 23,  which is most consistent with demand/supply mismatch and not ACS   This patient's plan of care was discussed and created with Dr. Juliann Pares and he is in agreement.  Signed: Gale Journey , PA-C 11/15/2022, 10:35 AM [facility]

## 2022-11-15 NOTE — Plan of Care (Signed)
Patient is progressing with plans of care to meet goals for discharge.  Terrilyn Saver, RN   Problem: Education: Goal: Verbalization of understanding the information provided (i.e., activity precautions, restrictions, etc) will improve Outcome: Progressing Goal: Individualized Educational Video(s) Outcome: Progressing   Problem: Activity: Goal: Ability to ambulate and perform ADLs will improve Outcome: Progressing   Problem: Clinical Measurements: Goal: Postoperative complications will be avoided or minimized Outcome: Progressing   Problem: Self-Concept: Goal: Ability to maintain and perform role responsibilities to the fullest extent possible will improve Outcome: Progressing   Problem: Pain Management: Goal: Pain level will decrease Outcome: Progressing   Problem: Education: Goal: Ability to demonstrate management of disease process will improve Outcome: Progressing Goal: Ability to verbalize understanding of medication therapies will improve Outcome: Progressing Goal: Individualized Educational Video(s) Outcome: Progressing   Problem: Activity: Goal: Capacity to carry out activities will improve Outcome: Progressing   Problem: Cardiac: Goal: Ability to achieve and maintain adequate cardiopulmonary perfusion will improve Outcome: Progressing   Problem: Education: Goal: Knowledge of disease or condition will improve Outcome: Progressing Goal: Knowledge of the prescribed therapeutic regimen will improve Outcome: Progressing Goal: Individualized Educational Video(s) Outcome: Progressing   Problem: Activity: Goal: Ability to tolerate increased activity will improve Outcome: Progressing Goal: Will verbalize the importance of balancing activity with adequate rest periods Outcome: Progressing   Problem: Respiratory: Goal: Ability to maintain a clear airway will improve Outcome: Progressing Goal: Levels of oxygenation will improve Outcome: Progressing Goal:  Ability to maintain adequate ventilation will improve Outcome: Progressing   Problem: Education: Goal: Knowledge of disease or condition will improve Outcome: Progressing Goal: Understanding of medication regimen will improve Outcome: Progressing Goal: Individualized Educational Video(s) Outcome: Progressing   Problem: Activity: Goal: Ability to tolerate increased activity will improve Outcome: Progressing   Problem: Cardiac: Goal: Ability to achieve and maintain adequate cardiopulmonary perfusion will improve Outcome: Progressing   Problem: Health Behavior/Discharge Planning: Goal: Ability to safely manage health-related needs after discharge will improve Outcome: Progressing   Problem: Education: Goal: Knowledge of General Education information will improve Description: Including pain rating scale, medication(s)/side effects and non-pharmacologic comfort measures Outcome: Progressing   Problem: Health Behavior/Discharge Planning: Goal: Ability to manage health-related needs will improve Outcome: Progressing   Problem: Clinical Measurements: Goal: Ability to maintain clinical measurements within normal limits will improve Outcome: Progressing Goal: Will remain free from infection Outcome: Progressing Goal: Diagnostic test results will improve Outcome: Progressing Goal: Respiratory complications will improve Outcome: Progressing Goal: Cardiovascular complication will be avoided Outcome: Progressing   Problem: Activity: Goal: Risk for activity intolerance will decrease Outcome: Progressing   Problem: Nutrition: Goal: Adequate nutrition will be maintained Outcome: Progressing   Problem: Coping: Goal: Level of anxiety will decrease Outcome: Progressing   Problem: Elimination: Goal: Will not experience complications related to bowel motility Outcome: Progressing Goal: Will not experience complications related to urinary retention Outcome: Progressing    Problem: Pain Managment: Goal: General experience of comfort will improve Outcome: Progressing   Problem: Safety: Goal: Ability to remain free from injury will improve Outcome: Progressing   Problem: Skin Integrity: Goal: Risk for impaired skin integrity will decrease Outcome: Progressing

## 2022-11-15 NOTE — Progress Notes (Signed)
[facility]  - [place] [facility]   PATIENT NAME: Matthew Zamora    MR#:  [id number]  DATE OF BIRTH:  08-Oct-1940  SUBJECTIVE:  no family at bedside. Patient sitting out in the chair. Overall feels better. Had issues with soft blood pressure last night although blood pressure this morning is relatively stable. Heart rate much improved around in the 90s.  VITALS:  Blood pressure 90/60, pulse 90, temperature (!) 97.5 F (36.4 C), resp. rate 18, height 5\' 8"  (1.727 m), weight 63.5 kg, SpO2 97 %.  PHYSICAL EXAMINATION:   GENERAL:  82 y.o.-year-old patient with no acute distress. Weak, debility LUNGS: Normal breath sounds bilaterally, no wheezing CARDIOVASCULAR: S1, S2 normal. No murmur, tachycardia ABDOMEN: Soft, nontender, nondistended. Bowel sounds present.  EXTREMITIES: No  edema b/l.    NEUROLOGIC: nonfocal  patient is alert and awake  LABORATORY PANEL:  CBC Recent Labs  Lab 11/15/22 0337  WBC 7.6  HGB 9.9*  HCT 30.9*  PLT 360     Chemistries  Recent Labs  Lab 11/12/22 0529 11/13/22 0536 11/15/22 0337  NA 135   < > 130*  K 4.4   < > 4.6  CL 98   < > 91*  CO2 26   < > 32  GLUCOSE 153*   < > 106*  BUN 33*   < > 35*  CREATININE 1.34*   < > 1.45*  CALCIUM 8.9   < > 8.2*  MG 2.1  --   --   AST 264*   < > 77*  ALT 249*   < > 174*  ALKPHOS 72   < > 73  BILITOT 0.6   < > 0.7   < > = values in this interval not displayed.            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  During previous hospitalization patient had asymptomatic V. tach/V-fib and received ICD shock after that he was converted to sinus rhythm.  Due to frequent ICD discharges patient was started on  amiodarone drip and discharged on p.o. amiodarone. Patient return for worsening shortness of breath.  Chest x-ray showed small bilateral pleural effusions and increasing pulmonary vascularity concerning for early CHF  Acute on chronic systolic CHF -Echocardiogram from 5/26 showed EF 30 to 35%, moderately decreased function of left ventricle with global hypokinesis -BNP elevated 1044; started on Lasix 40 mg IV every 12 hours--changed to oral lasix from am -Continue to monitor strict intake and output -[facility] following --Good UOP ~12.2L   Atrial fibrillation with RVR -Amiodarone stopped by cardiology due to worsening LFTs -Will try to control heart rate with metoprolol -Dose of metoprolol changed to 25 mg p.o. every 6 hours -Continue anticoagulation with apixaban -Continue metoprolol 2.5 mg IV every 6 hours as needed --pt follows with EP at [facility] Dr Excell Seltzer   ? Multifocal pneumonia -Started on vancomycin and cefepime for multifocal pneumonia seen on CT chest -Patient is afebrile, normal WBC count, no increasing oxygen requirement -Procalcitonin is less than 0.10, urinary strep pneumo antigen negative -Discussed with ID, antibiotics were discontinued   COPD exacerbation -Started on Solu-Medrol 40 mg IV every 12 hours -Discontinued  scheduled DuoNebs due to worsening A-fib with RVR -  Started Atrovent nebulizer every 6 hours -Continue Xopenex nebulizer every 6 hours as needed  Chronic Orthostatic hypotension --pt on pyridostigmine and midodrine --for now resume pyridostigmine   Transaminitis -Presenting with worsening LFTs; was on amiodarone at discharge -Likely from hepatic congestion from CHF exacerbation - LFTs are improving   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 -Creatinine 1.34 today --baseline creat 1.2--1.5   History of V-fib/VT -S/p ICD discharges during previous admission -He was started on amiodarone which is currently discontinued due to transaminitis --pt follows with Dr Huel Coventry at [facility] (EP)   CAD s/p MI  -DES RCA 2019 -no active chest pain    Recent Hx of Right hip fx s/p repair -PT to be initiated   Procedures: Family communication :none today Consults :[facility] cardiology CODE STATUS: FULL DVT Prophylaxis :apixaban Level of care: Progressive Status is: Inpatient Remains inpatient appropriate because: awaiting insurance authorization. Patient is medically best at baseline for discharge.    TOTAL TIME TAKING CARE OF THIS PATIENT: 35 minutes.  >50% time spent on counselling and coordination of care  Note: This dictation was prepared with Dragon dictation along with smaller phrase technology. Any transcriptional errors that result from this process are unintentional.  Enedina Finner M.D    [facility]   CC: Primary care physician; Jerl Mina, MD

## 2022-11-15 NOTE — TOC Progression Note (Addendum)
Transition of Care [facility]) - Progression Note    Patient Details  Name: Matthew Zamora MRN: [id number] Date of Birth: 09-17-1940  Transition of Care [facility]) CM/SW Contact  Truddie Hidden, RN Phone Number: 11/15/2022, 9:52 AM  Clinical Narrative:    Vesta Mixer pending  2:15pm Retrieved call from representative, Lakshimi. She inquired about updated therapy notes, hypotension and plan for antibiotics. Lakshimi updated per PT progress note from 5/31. She was advised therapy was unable to be attempt due patient being symptomatic. Lakshim was advised patient's antibiotics have been discontinued per MD progress note.  Lakshimi request updated PT therapy and MD progress note be updated to portal.  Case may require medical director review by Lakshimi.   Requested clinical uploaded to [facility].         Expected Discharge Plan and Services                                               Social Determinants of Health (SDOH) Interventions SDOH Screenings   Food Insecurity: No Food Insecurity (11/10/2022)  Housing: Low Risk  (11/10/2022)  Transportation Needs: No Transportation Needs (11/10/2022)  Utilities: Not At Risk (11/10/2022)  Tobacco Use: Medium Risk (11/10/2022)    Readmission Risk Interventions    04/22/2022    3:54 PM  Readmission Risk Prevention Plan  Transportation Screening Complete  Medication Review (RN Care Manager) Complete  PCP or Specialist appointment within 3-5 days of discharge Complete  HRI or Home Care Consult Complete  SW Recovery Care/Counseling Consult Complete  Palliative Care Screening Not Applicable  Skilled Nursing Facility Not Applicable

## 2022-11-16 DIAGNOSIS — J441 Chronic obstructive pulmonary disease with (acute) exacerbation: Secondary | ICD-10-CM | POA: Diagnosis not present

## 2022-11-16 LAB — CULTURE, BLOOD (ROUTINE X 2): Culture: NO GROWTH

## 2022-11-16 MED ORDER — ACETAMINOPHEN 325 MG PO TABS: 650.0000 mg | ORAL_TABLET | Freq: Four times a day (QID) | ORAL | Status: DC | PRN

## 2022-11-16 MED ORDER — GUAIFENESIN-DM 100-10 MG/5ML PO SYRP
5.0000 mL | ORAL_SOLUTION | ORAL | Status: DC | PRN
  Administered 2022-11-16: 5 mL via ORAL
  Filled 2022-11-16: qty 10

## 2022-11-16 NOTE — TOC Progression Note (Signed)
Transition of Care [facility]) - Progression Note    Patient Details  Name: Matthew Zamora MRN: [id number] Date of Birth: 1940-10-19  Transition of Care [facility]) CM/SW Contact  Kemper Durie, RN Phone Number: 11/16/2022, 9:32 AM  Clinical Narrative:     [facility] Harvey remains in pending status.       Expected Discharge Plan and Services                                               Social Determinants of Health (SDOH) Interventions SDOH Screenings   Food Insecurity: No Food Insecurity (11/10/2022)  Housing: Low Risk  (11/10/2022)  Transportation Needs: No Transportation Needs (11/10/2022)  Utilities: Not At Risk (11/10/2022)  Tobacco Use: Medium Risk (11/10/2022)    Readmission Risk Interventions    04/22/2022    3:54 PM  Readmission Risk Prevention Plan  Transportation Screening Complete  Medication Review (RN Care Manager) Complete  PCP or Specialist appointment within 3-5 days of discharge Complete  HRI or Home Care Consult Complete  SW Recovery Care/Counseling Consult Complete  Palliative Care Screening Not Applicable  Skilled Nursing Facility Not Applicable

## 2022-11-16 NOTE — Progress Notes (Signed)
Mobility Specialist - Progress Note  Pre-mobility:SpO2(100) During mobility: SpO2(97)   11/16/22 1616  Mobility  Activity Dangled on edge of bed;Turned to right side;Turned to left side;Turned to back - supine  Range of Motion/Exercises Active  RLE Weight Bearing WBAT  Activity Response Tolerated well  Mobility Referral Yes  $Mobility charge 1 Mobility  Mobility Specialist Start Time (ACUTE ONLY) 1550  Mobility Specialist Stop Time (ACUTE ONLY) 1618  Mobility Specialist Time Calculation (min) (ACUTE ONLY) 28 min   Pt resting in bed on 2L upon entry. Pt attempts sits up to move to EOB but, pt had unknown BM. Pt rolled to left and right side to perform hygiene clean x2. Pt endorses fatigue after all the rolling. Pt left with needs in reach and RN present at bedside upon exit.    Johnathan Hausen Mobility Specialist 11/16/22, 4:24 PM

## 2022-11-16 NOTE — Progress Notes (Signed)
[facility]  - [place] at [facility]   PATIENT NAME: Matthew Zamora    MR#:  [id number]  DATE OF BIRTH:  Nov 12, 1940  SUBJECTIVE:  no family at bedside.  HR in the 90's no cp VITALS:  Blood pressure 94/68, pulse 81, temperature 98.1 F (36.7 C), resp. rate 20, height 5\' 8"  (1.727 m), weight 65.2 kg, SpO2 100 %.  PHYSICAL EXAMINATION:   GENERAL:  82 y.o.-year-old patient with no acute distress. Weak, debility LUNGS: Normal breath sounds bilaterally, no wheezing CARDIOVASCULAR: S1, S2 normal. No murmur, tachycardia ABDOMEN: Soft, nontender, nondistended. Bowel sounds present.  EXTREMITIES: No  edema b/l.    NEUROLOGIC: nonfocal  patient is alert and awake  LABORATORY PANEL:  CBC Recent Labs  Lab 11/15/22 0337  WBC 7.6  HGB 9.9*  HCT 30.9*  PLT 360     Chemistries  Recent Labs  Lab 11/12/22 0529 11/13/22 0536 11/15/22 0337  NA 135   < > 130*  K 4.4   < > 4.6  CL 98   < > 91*  CO2 26   < > 32  GLUCOSE 153*   < > 106*  BUN 33*   < > 35*  CREATININE 1.34*   < > 1.45*  CALCIUM 8.9   < > 8.2*  MG 2.1  --   --   AST 264*   < > 77*  ALT 249*   < > 174*  ALKPHOS 72   < > 73  BILITOT 0.6   < > 0.7   < > = values in this interval not displayed.            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  During previous hospitalization patient had asymptomatic V. tach/V-fib and received ICD shock after that he was converted to sinus rhythm.  Due to frequent ICD discharges patient was started on amiodarone drip and discharged on p.o. amiodarone. Patient return for worsening shortness of breath.  Chest x-ray showed small bilateral pleural effusions and increasing pulmonary vascularity  concerning for early CHF  Acute on chronic systolic CHF -Echocardiogram from 5/26 showed EF 30 to 35%, moderately decreased function of left ventricle with global hypokinesis -BNP elevated 1044; started on Lasix 40 mg IV every 12 hours--changed to oral lasix -Continue to monitor strict intake and output -[name] Cardiology following --Good UOP ~12.4 L   Atrial fibrillation with RVR -Amiodarone stopped by cardiology due to worsening LFTs -Will try to control heart rate with metoprolol -Dose of metoprolol changed to 25 mg p.o. every 6 hours -Continue anticoagulation with apixaban -Continue metoprolol 2.5 mg IV every 6 hours as needed --pt follows with EP at [facility] Dr Excell Seltzer. --HOLD Amiodarone per cardiology at discharge.   ? Multifocal pneumonia -Started on vancomycin and cefepime for multifocal pneumonia seen on CT chest -Patient is afebrile, normal WBC count, no increasing oxygen requirement -Procalcitonin is less than 0.10, urinary strep pneumo antigen negative -Discussed with ID, antibiotics were discontinued   COPD exacerbation -Started on Solu-Medrol 40 mg IV every 12 hours -Discontinued  scheduled DuoNebs due to worsening A-fib with RVR -Started Atrovent nebulizer every 6 hours -Continue Xopenex nebulizer every 6 hours as needed  Chronic Orthostatic hypotension --pt on pyridostigmine and midodrine --  for now resume pyridostigmine   Transaminitis -Presenting with worsening LFTs; was on amiodarone POA -Likely from hepatic congestion from CHF exacerbation -Follow LFTs improving   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 -Creatinine 1.34 today --baseline creat 1.2--1.5   History of V-fib/VT -S/p ICD discharges during previous admission -He was started on amiodarone which is currently discontinued due to transaminitis --pt follows with Dr Huel Coventry at [facility] (EP)   CAD s/p MI  -DES RCA 2019 -no active chest pain    Recent Hx of Right hip fx s/p repair -PT to be initiated   Procedures: Family  communication :DIL [name] left VM Consults :[facility] cardiology CODE STATUS: FULL DVT Prophylaxis :apixaban Level of care: Progressive Status is: Inpatient Remains inpatient appropriate because: CHF and tachycardia    TOTAL TIME TAKING CARE OF THIS PATIENT: 35 minutes.  >50% time spent on counselling and coordination of care  Note: This dictation was prepared with Dragon dictation along with smaller phrase technology. Any transcriptional errors that result from this process are unintentional.  Enedina Finner M.D    [facility]   CC: Primary care physician; Jerl Mina, MD

## 2022-11-16 NOTE — Progress Notes (Signed)
SUBJECTIVE: Patient denies any chest pain and has shortness of breath only on exertion.   Vitals:   11/15/22 2012 11/15/22 2337 11/16/22 0344 11/16/22 0914  BP: 97/60 93/78 102/61 95/65  Pulse: 92 90  97  Resp: 17 17  18   Temp: 97.6 F (36.4 C) 98.8 F (37.1 C) 98.1 F (36.7 C) (!) 97.5 F (36.4 C)  TempSrc:  Oral Oral   SpO2: 100% 96%  99%  Weight:   65.2 kg   Height:        Intake/Output Summary (Last 24 hours) at 11/16/2022 1114 Last data filed at 11/16/2022 0917 Gross per 24 hour  Intake --  Output 2100 ml  Net -2100 ml    LABS: Basic Metabolic Panel: Recent Labs    11/14/22 0548 11/15/22 0337  NA 131* 130*  K 4.2 4.6  CL 93* 91*  CO2 30 32  GLUCOSE 165* 106*  BUN 38* 35*  CREATININE 1.34* 1.45*  CALCIUM 8.3* 8.2*   Liver Function Tests: Recent Labs    11/14/22 0548 11/15/22 0337  AST 113* 77*  ALT 205* 174*  ALKPHOS 70 73  BILITOT 0.6 0.7  PROT 5.1* 5.1*  ALBUMIN 2.6* 2.7*   No results for input(s): "LIPASE", "AMYLASE" in the last 72 hours. CBC: Recent Labs    11/15/22 0337  WBC 7.6  HGB 9.9*  HCT 30.9*  MCV 91.2  PLT 360   Cardiac Enzymes: No results for input(s): "CKTOTAL", "CKMB", "CKMBINDEX", "TROPONINI" in the last 72 hours. BNP: Invalid input(s): "POCBNP" D-Dimer: No results for input(s): "DDIMER" in the last 72 hours. Hemoglobin A1C: No results for input(s): "HGBA1C" in the last 72 hours. Fasting Lipid Panel: No results for input(s): "CHOL", "HDL", "LDLCALC", "TRIG", "CHOLHDL", "LDLDIRECT" in the last 72 hours. Thyroid Function Tests: No results for input(s): "TSH", "T4TOTAL", "T3FREE", "THYROIDAB" in the last 72 hours.  Invalid input(s): "FREET3" Anemia Panel: No results for input(s): "VITAMINB12", "FOLATE", "FERRITIN", "TIBC", "IRON", "RETICCTPCT" in the last 72 hours.   PHYSICAL EXAM General: Well developed, well nourished, in no acute distress HEENT:  Normocephalic and atramatic Neck:  No JVD.  Lungs: Clear bilaterally  to auscultation and percussion. Heart: HRRR . Normal S1 and S2 without gallops or murmurs.  Abdomen: Bowel sounds are positive, abdomen soft and non-tender  Msk:  Back normal, normal gait. Normal strength and tone for age. Extremities: No clubbing, cyanosis or edema.   Neuro: Alert and oriented X 3. Psych:  Good affect, responds appropriately  TELEMETRY: Atrial fibrillation with a controlled ventricular rate approximately 90 bpm  ASSESSMENT AND PLAN: Patient is feeling much better denies any chest pain or shortness of breath at rest.  Admitted with pneumonia and CHF.  Patient converted to p.o. Lasix and continues to get antibiotic for pneumonia.  A-fib is controlled with metoprolol and has persistent A-fib.  Patient has prior history of severe LV dysfunction and AICD implantation and history of PCI with stenting of the RCA.  Continue to follow the patient.   ICD-10-CM   1. Transaminitis  R74.01     2. COPD exacerbation ([name])  J44.1     3. Acute on chronic congestive heart failure, unspecified heart failure type ([name])  I50.9     4. Acute on chronic combined systolic (congestive) and diastolic (congestive) heart failure ([name])  I50.43 Amb referral to AFIB Clinic      Principal Problem:   COPD exacerbation [facility])    Adrian Blackwater, MD, [facility] 11/16/2022 11:14 AM

## 2022-11-17 DIAGNOSIS — J441 Chronic obstructive pulmonary disease with (acute) exacerbation: Secondary | ICD-10-CM | POA: Diagnosis not present

## 2022-11-17 DIAGNOSIS — I4819 Other persistent atrial fibrillation: Secondary | ICD-10-CM | POA: Diagnosis not present

## 2022-11-17 NOTE — Progress Notes (Signed)
SUBJECTIVE: Patient denies any chest pain or shortness of breath at this time   Vitals:   11/16/22 1922 11/16/22 2340 11/17/22 0355 11/17/22 0819  BP: (!) 99/53 103/61 (!) 109/59 91/63  Pulse: 95 72 100 86  Resp: 18 18 18 16   Temp: 97.9 F (36.6 C) 98.2 F (36.8 C) 98.2 F (36.8 C) 97.9 F (36.6 C)  TempSrc: Oral  Oral   SpO2: 100% 100% 99% 100%  Weight:   66.3 kg   Height:        Intake/Output Summary (Last 24 hours) at 11/17/2022 1046 Last data filed at 11/17/2022 0355 Gross per 24 hour  Intake 240 ml  Output 2200 ml  Net -1960 ml    LABS: Basic Metabolic Panel: Recent Labs    11/15/22 0337  NA 130*  K 4.6  CL 91*  CO2 32  GLUCOSE 106*  BUN 35*  CREATININE 1.45*  CALCIUM 8.2*   Liver Function Tests: Recent Labs    11/15/22 0337  AST 77*  ALT 174*  ALKPHOS 73  BILITOT 0.7  PROT 5.1*  ALBUMIN 2.7*   No results for input(s): "LIPASE", "AMYLASE" in the last 72 hours. CBC: Recent Labs    11/15/22 0337  WBC 7.6  HGB 9.9*  HCT 30.9*  MCV 91.2  PLT 360   Cardiac Enzymes: No results for input(s): "CKTOTAL", "CKMB", "CKMBINDEX", "TROPONINI" in the last 72 hours. BNP: Invalid input(s): "POCBNP" D-Dimer: No results for input(s): "DDIMER" in the last 72 hours. Hemoglobin A1C: No results for input(s): "HGBA1C" in the last 72 hours. Fasting Lipid Panel: No results for input(s): "CHOL", "HDL", "LDLCALC", "TRIG", "CHOLHDL", "LDLDIRECT" in the last 72 hours. Thyroid Function Tests: No results for input(s): "TSH", "T4TOTAL", "T3FREE", "THYROIDAB" in the last 72 hours.  Invalid input(s): "FREET3" Anemia Panel: No results for input(s): "VITAMINB12", "FOLATE", "FERRITIN", "TIBC", "IRON", "RETICCTPCT" in the last 72 hours.   PHYSICAL EXAM General: Well developed, well nourished, in no acute distress HEENT:  Normocephalic and atramatic Neck:  No JVD.  Lungs: Clear bilaterally to auscultation and percussion. Heart: HRRR . Normal S1 and S2 without gallops or  murmurs.  Abdomen: Bowel sounds are positive, abdomen soft and non-tender  Msk:  Back normal, normal gait. Normal strength and tone for age. Extremities: No clubbing, cyanosis or edema.   Neuro: Alert and oriented X 3. Psych:  Good affect, responds appropriately  TELEMETRY: A-fib with controlled ventricular rate  ASSESSMENT AND PLAN: Patient is feeling much better denies any chest pain or shortness of breath at rest. Admitted with pneumonia and CHF. Patient converted to p.o. Lasix and continues to get antibiotic for pneumonia. A-fib is controlled with metoprolol and has persistent A-fib. Patient has prior history of severe LV dysfunction and AICD implantation and history of PCI with stenting of the RCA. Continue to follow the pati    ICD-10-CM   1. Transaminitis  R74.01     2. COPD exacerbation ([name])  J44.1     3. Acute on chronic congestive heart failure, unspecified heart failure type ([name])  I50.9     4. Acute on chronic combined systolic (congestive) and diastolic (congestive) heart failure ([name])  I50.43 Amb referral to AFIB Clinic      Principal Problem:   COPD exacerbation [facility])    Adrian Blackwater, MD, [facility] 11/17/2022 10:46 AM

## 2022-11-17 NOTE — Progress Notes (Signed)
Mobility Specialist - Progress Note  Pre-mobility: HR, BP, SpO2 During mobility: HR, BP, SpO2 Post-mobility: HR, BP, SPO2     11/17/22 1333  Mobility  Activity Ambulated with assistance in room  Level of Assistance Minimal assist, patient does 75% or more  Assistive Device Front wheel walker  Distance Ambulated (ft) 5 ft  Range of Motion/Exercises Active  RLE Weight Bearing WBAT  Activity Response Tolerated well  Mobility Referral Yes  $Mobility charge 1 Mobility  Mobility Specialist Start Time (ACUTE ONLY) 1316  Mobility Specialist Stop Time (ACUTE ONLY) 1333  Mobility Specialist Time Calculation (min) (ACUTE ONLY) 17 min   Pt resting in bed on 2L upon entry. Pt performed bed mobility ModI. Pt STS and ambulates in room at bedside with encouragement MinA with RW. Pt endorses fatigue after returning to bed. Pt left with needs in reach and bed alarm activated.   Matthew Zamora Mobility Specialist 11/17/22, 1:40 PM

## 2022-11-17 NOTE — Progress Notes (Signed)
[facility]  - [place] at [facility]   PATIENT NAME: Matthew Zamora    MR#:  [id number]  DATE OF BIRTH:  07/29/1940  SUBJECTIVE:  no family at bedside.  HR in the 90's no cp. Eating reasonably well VITALS:  Blood pressure (!) 104/53, pulse (!) 104, temperature 97.8 F (36.6 C), temperature source Oral, resp. rate 18, height 5\' 8"  (1.727 m), weight 66.3 kg, SpO2 100 %.  PHYSICAL EXAMINATION:   GENERAL:  82 y.o.-year-old patient with no acute distress. Weak, debility LUNGS: Normal breath sounds bilaterally, no wheezing CARDIOVASCULAR: S1, S2 normal. No murmur, tachycardia ABDOMEN: Soft, nontender, nondistended. Bowel sounds present.  EXTREMITIES: No  edema b/l.    NEUROLOGIC: nonfocal  patient is alert and awake  LABORATORY PANEL:  CBC Recent Labs  Lab 11/15/22 0337  WBC 7.6  HGB 9.9*  HCT 30.9*  PLT 360     Chemistries  Recent Labs  Lab 11/12/22 0529 11/13/22 0536 11/15/22 0337  NA 135   < > 130*  K 4.4   < > 4.6  CL 98   < > 91*  CO2 26   < > 32  GLUCOSE 153*   < > 106*  BUN 33*   < > 35*  CREATININE 1.34*   < > 1.45*  CALCIUM 8.9   < > 8.2*  MG 2.1  --   --   AST 264*   < > 77*  ALT 249*   < > 174*  ALKPHOS 72   < > 73  BILITOT 0.6   < > 0.7   < > = values in this interval not displayed.            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  During previous hospitalization patient had asymptomatic V. tach/V-fib and received ICD shock after that he was converted to sinus rhythm.  Due to frequent ICD discharges patient was started on amiodarone drip and discharged on p.o. amiodarone. Patient return for worsening shortness of breath.  Chest x-ray showed small  bilateral pleural effusions and increasing pulmonary vascularity concerning for early CHF  Acute on chronic systolic CHF -Echocardiogram from 5/26 showed EF 30 to 35%, moderately decreased function of left ventricle with global hypokinesis -BNP elevated 1044; started on Lasix 40 mg IV every 12 hours--changed to oral lasix -Continue to monitor strict intake and output -[facility] Card[facility] following --Good UOP ~12.4 L   Atrial fibrillation with RVR -Amiodarone stopped by cardiology due to worsening LFTs -Will try to control heart rate with metoprolol -Dose of metoprolol changed to 25 mg p.o. every 6 hours -Continue anticoagulation with apixaban -Continue metoprolol 2.5 mg IV every 6 hours as needed --pt follows with EP at [facility] Dr Excell Seltzer. --HOLD Amiodarone per cardiology at discharge.   ? Multifocal pneumonia -Started on vancomycin and cefepime for multifocal pneumonia seen on CT chest -Patient is afebrile, normal WBC count, no increasing oxygen requirement -Procalcitonin is less than 0.10, urinary strep pneumo antigen negative -Discussed with ID, antibiotics were discontinued   COPD exacerbation -Started on Solu-Medrol 40 mg IV every 12 hours -Discontinued  scheduled DuoNebs due to worsening A-fib with RVR -Started Atrovent nebulizer every 6 hours -Continue Xopenex nebulizer every 6 hours as needed  Chronic Orthostatic hypotension --pt on pyridostigmine and midodrine --for now resume pyridostigmine   Transaminitis -Presenting with worsening LFTs; was on amiodarone POA -Likely from hepatic congestion from CHF exacerbation -Follow LFTs improving   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 -Creatinine 1.34 today --baseline creat 1.2--1.5   History of V-fib/VT -S/p ICD discharges during previous admission -He was started on amiodarone which is currently discontinued due to transaminitis --pt follows with Dr Huel Coventry at [facility] (EP)   CAD s/p MI  -DES RCA 2019 -no active chest pain    Recent Hx of  Right hip fx s/p repair -PT to be initiated   Procedures: Family communication :DIL [name] left VM Consults :[facility] cardiology CODE STATUS: FULL DVT Prophylaxis :apixaban Level of care: Progressive Status is: Inpatient Remains inpatient appropriate because:pending insurance auth    TOTAL TIME TAKING CARE OF THIS PATIENT: 35 minutes.  >50% time spent on counselling and coordination of care  Note: This dictation was prepared with Dragon dictation along with smaller phrase technology. Any transcriptional errors that result from this process are unintentional.  Enedina Finner M.D    [facility]   CC: Primary care physician; Jerl Mina, MD

## 2022-11-18 DIAGNOSIS — J441 Chronic obstructive pulmonary disease with (acute) exacerbation: Secondary | ICD-10-CM | POA: Diagnosis not present

## 2022-11-18 LAB — POTASSIUM: Potassium: 3.7 mmol/L (ref 3.5–5.1)

## 2022-11-18 LAB — MAGNESIUM: Magnesium: 2.1 mg/dL (ref 1.7–2.4)

## 2022-11-18 MED ORDER — FUROSEMIDE 20 MG PO TABS: 20.0000 mg | ORAL_TABLET | Freq: Every day | ORAL | 0 refills | Status: DC

## 2022-11-18 MED ORDER — GUAIFENESIN-DM 100-10 MG/5ML PO SYRP: 5.0000 mL | ORAL_SOLUTION | ORAL | 0 refills | Status: DC | PRN

## 2022-11-18 MED ORDER — MIDODRINE HCL 5 MG PO TABS: 5.0000 mg | ORAL_TABLET | Freq: Three times a day (TID) | ORAL | 1 refills | Status: DC

## 2022-11-18 MED ORDER — METOPROLOL TARTRATE 25 MG PO TABS: 25.0000 mg | ORAL_TABLET | Freq: Two times a day (BID) | ORAL | Status: DC

## 2022-11-18 MED ORDER — METOPROLOL TARTRATE 25 MG PO TABS: 25.0000 mg | ORAL_TABLET | Freq: Two times a day (BID) | ORAL | 1 refills | Status: DC

## 2022-11-18 MED ORDER — POTASSIUM CHLORIDE 20 MEQ PO PACK
40.0000 meq | PACK | Freq: Once | ORAL | Status: AC
  Administered 2022-11-18: 40 meq via ORAL
  Filled 2022-11-18: qty 2

## 2022-11-18 MED ORDER — FUROSEMIDE 20 MG PO TABS: 20.0000 mg | ORAL_TABLET | Freq: Every day | ORAL | Status: DC

## 2022-11-18 NOTE — TOC Progression Note (Signed)
Transition of Care [facility]) - Progression Note    Patient Details  Name: Matthew Zamora MRN: [id number] Date of Birth: Jun 06, 1941  [facility] of [facility]) CM/SW Contact  Truddie Hidden, RN Phone Number: 11/18/2022, 11:22 AM  Clinical Narrative:    Per [employer] portal auth remains in pending status. MD notified.   11:23am Retrieved incoming call from [facility]. Spoke with representative Danelle Earthly. A Peer to Peer is being offered by the medical director. Danelle Earthly provided with contact number to call for attending MD at [phone] option 5. Peer to Peer must be completed by 3:30pm EST today.  MD notified.          Expected Discharge Plan and Services                                               Social Determinants of Health (SDOH) Interventions SDOH Screenings   Food Insecurity: No Food Insecurity (11/10/2022)  Housing: Low Risk  (11/10/2022)  Transportation Needs: No Transportation Needs (11/10/2022)  Utilities: Not At Risk (11/10/2022)  Tobacco Use: Medium Risk (11/10/2022)    Readmission Risk Interventions    04/22/2022    3:54 PM  Readmission Risk Prevention Plan  Transportation Screening Complete  Medication Review (RN Care Manager) Complete  PCP or Specialist appointment within 3-5 days of discharge Complete  HRI or Home Care Consult Complete  SW Recovery Care/Counseling Consult Complete  Palliative Care Screening Not Applicable  Skilled Nursing Facility Not Applicable

## 2022-11-18 NOTE — Discharge Summary (Signed)
Physician Discharge Summary   Patient: Matthew Zamora MRN: [id number] DOB: 30-Dec-1940  Admit date:     11/10/2022  Discharge date: 11/18/22  Discharge Physician: Enedina Finner   PCP: Jerl Mina, MD   Recommendations at discharge:   follow-up Dr. 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  During previous hospitalization patient had asymptomatic V. tach/V-fib and received ICD shock after that he was converted to sinus rhythm.  Due to frequent ICD discharges patient was started on amiodarone drip and discharged on p.o. amiodarone. Patient return for worsening shortness of breath.  Chest x-ray showed small bilateral pleural effusions and increasing pulmonary vascularity concerning for early CHF   Acute on chronic systolic CHF -Echocardiogram from 5/26 showed EF 30 to 35%, moderately decreased function of left ventricle with global hypokinesis -BNP elevated 1044; started on Lasix 40 mg IV every 12 hours--changed to oral lasix [facility] following --Good UOP ~12.4 L   Atrial fibrillation with RVR -Amiodarone stopped by cardiology due to worsening LFTs -Dose of metoprolol changed to 25 mg p.o. every 6 hours--now on bid dose -Continue anticoagulation with apixaban --pt follows with EP at [facility] Dr Excell Seltzer. --HOLD Amiodarone per cardiology at discharge.   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 -Creatinine 1.34 today --baseline creat 1.2--1.5   History of V-fib/VT -S/p ICD discharges during previous admission -He was started on amiodarone which is currently discontinued due to transaminitis --pt follows with Dr Huel Coventry at [facility] (EP)--family [facility]) informed to make appt for f/u   CAD s/p MI  -DES RCA 2019 -no active chest pain    Recent Hx of Right hip fx s/p repair -PT to be initiated  --pt to follow ortho as out pt     Procedures:none Family communication :DIL [name] informed of discharge Consults :[facility] cardiology CODE STATUS: FULL DVT Prophylaxis :apixaban Level of care: Progressive Status is: Inpatient  Peer to Peer review was done with medical director of the insurance. Patient has been approved for rehab per my conversation with the medical director     Pain control - [facility] Controlled Substance Reporting System database was reviewed. and patient was instructed, not to drive, operate heavy machinery, perform activities at heights, swimming or participation in water activities or provide baby-sitting services while on Pain, Sleep and Anxiety Medications; until their outpatient Physician has advised to do so again. Also recommended to not to take more than prescribed Pain, Sleep and Anxiety Medications.  Diet recommendation:  Discharge Diet Orders (From admission, onward)     Start     Ordered   11/18/22 0000  Diet -  low sodium heart healthy        11/18/22 1318            DISCHARGE MEDICATION: Allergies as of 11/18/2022       Reactions   Nitrofuran  Derivatives Other (See Comments)   Transaminitis ** confounded w/Amiodarone, Mexiletine   Spironolactone    Significant transaminitis         Medication List     STOP taking these medications    amiodarone 400 MG tablet Commonly known as: PACERONE       TAKE these medications    (feeding supplement) PROSource Plus liquid Take 30 mLs by mouth 3 (three) times daily between meals.   acetaminophen 325 MG tablet Commonly known as: TYLENOL Take 2 tablets (650 mg total) by mouth every 4 (four) hours as needed for headache or mild pain.   albuterol (2.5 MG/3ML) 0.083% nebulizer solution Commonly known as: PROVENTIL Inhale 3 mLs (2.5 mg total) into the lungs as needed for shortness of breath. What changed: Another medication with the same name was removed. Continue taking this medication, and follow the directions you see here.   ascorbic acid 500 MG tablet Commonly known as: VITAMIN C Take 1 tablet by mouth daily.   atorvastatin 80 MG tablet Commonly known as: LIPITOR Take 80 mg by mouth daily.   calcium carbonate 750 MG chewable tablet Commonly known as: TUMS EX Chew 1 tablet by mouth daily.   Eliquis 2.5 MG Tabs tablet Generic drug: apixaban Take 2.5 mg by mouth 2 (two) times daily.   fluticasone-salmeterol 500-50 MCG/ACT Aepb Commonly known as: ADVAIR Inhale 1 puff into the lungs in the morning and at bedtime.   furosemide 20 MG tablet Commonly known as: LASIX Take 1 tablet (20 mg total) by mouth daily. Start taking on: November 19, 2022   gabapentin 100 MG capsule Commonly known as: NEURONTIN Take 100 mg by mouth at bedtime.   guaiFENesin-dextromethorphan 100-10 MG/5ML syrup Commonly known as: ROBITUSSIN DM Take 5 mLs by mouth every 4 (four) hours as needed for cough.   HYDROcodone-acetaminophen 5-325 MG tablet Commonly known as: NORCO/VICODIN Take 1-2 tablets by mouth every 4 (four) hours as needed for moderate pain (pain score 4-6).   magnesium oxide 400  (240 Mg) MG tablet Commonly known as: MAG-OX Take 1 tablet by mouth daily.   melatonin 3 MG Tabs tablet Take 9 mg by mouth at bedtime.   metoCLOPramide 5 MG tablet Commonly known as: REGLAN Take 1 tablet (5 mg total) by mouth every 8 (eight) hours as needed for refractory nausea / vomiting.   metoprolol tartrate 25 MG tablet Commonly known as: LOPRESSOR Take 1 tablet (25 mg total) by mouth 2 (two) times daily. What changed: how much to take   midodrine 5 MG tablet Commonly known as: PROAMATINE Take 1 tablet (5 mg total) by mouth 3 (three) times daily with meals. What changed: how much to take   multivitamin with minerals Tabs tablet Take 1 tablet by mouth daily.   Nebulizer Misc 1 each by Does not apply route as needed.   ondansetron 4 MG disintegrating tablet Commonly known as: ZOFRAN-ODT Take 4 mg by mouth every 8 (eight) hours as needed for refractory nausea / vomiting, vomiting or nausea.   pantoprazole 40 MG tablet Commonly known as: PROTONIX Take 1 tablet (40 mg total) by mouth 2 (two) times daily.   polyethylene glycol 17 g packet Commonly known as: MIRALAX / GLYCOLAX Take 17 g by mouth 2 (  two) times daily.   pyridostigmine 60 MG tablet Commonly known as: MESTINON Take 1 tablet (60 mg total) by mouth every 8 (eight) hours.   sertraline 25 MG tablet Commonly known as: ZOLOFT Take 25 mg by mouth daily.   sucralfate 1 GM/10ML suspension Commonly known as: CARAFATE Take 10 mLs (1 g total) by mouth 4 (four) times daily -  with meals and at bedtime.   tamsulosin 0.4 MG Caps capsule Commonly known as: FLOMAX Take 0.4 mg by mouth daily.               Discharge Care Instructions  (From admission, onward)           Start     Ordered   11/18/22 0000  Discharge wound care:       Comments: Pressure Injury 11/05/22 Buttocks Bilateral Stage 2 -  Partial thickness loss of dermis presenting as a shallow open injury with a red, pink wound bed without  slough--foam padding   11/18/22 1318            Follow-up Information     Orlean Bradford, NP. Go in 1 week(s).   Specialty: Cardiology Contact information: [address] [place] [address] [phone]         Marcina Millard, MD. Go in 1 week(s).   Specialty: Cardiology Contact information: [address] [facility] [place] [address] [phone]         Reinaldo Berber, MD. Schedule an appointment as soon as possible for a visit in 1 week(s).   Specialty: Orthopedic Surgery Why: post-op f/u Contact information: [address] [place] [address] [phone]         Jerl Mina, MD. Schedule an appointment as soon as possible for a visit in 1 week(s).   Specialty: Family Medicine Why: hospital f/u Contact information: [address] [place] [address] [phone]                Discharge Exam: Ceasar Mons Weights   11/15/22 0505 11/16/22 0344 11/17/22 0355  Weight: 63.5 kg 65.2 kg 66.3 kg    GENERAL:  82 y.o.-year-old patient with no acute distress. Weak, debility LUNGS: Normal breath sounds bilaterally, no wheezing CARDIOVASCULAR: S1, S2 normal. No murmur, tachycardia ABDOMEN: Soft, nontender, nondistended. Bowel sounds present.  EXTREMITIES: No  edema b/l.    NEUROLOGIC: nonfocal  patient is alert and awake Condition at discharge: fair  The results of significant diagnostics from this hospitalization (including imaging, microbiology, ancillary and laboratory) are listed below for reference.   Imaging Studies: ECHOCARDIOGRAM COMPLETE  Result Date: 11/10/2022    ECHOCARDIOGRAM REPORT   Patient Name:   MERTON OSLER Date of Exam: 11/10/2022 Medical Rec #:  [id number]           Height:       68.0 in Accession #:    [id number]          Weight:       153.0 lb Date of Birth:  09-21-1940           BSA:          1.824 m Patient Age:    81 years             BP:           126/82 mmHg Patient Gender: M  HR:           121 bpm. Exam Location:  [facility] Procedure: 2D Echo Indications:     CHF I50.21  History:         Patient has prior history of Echocardiogram examinations, most                  recent 04/21/2022.  Sonographer:     Overton Mam RDCS, FASE Referring Phys:  [id number] Beulah Gandy A THOMAS Diagnosing Phys: Julien Nordmann MD  Sonographer Comments: Technically difficult study due to poor echo windows and suboptimal parasternal window. Image acquisition challenging due to respiratory motion. IMPRESSIONS  1. Left ventricular ejection fraction, by estimation, is 30 to 35%. Left ventricular ejection fraction by PLAX is 34 %. The left ventricle has moderately decreased function. The left ventricle demonstrates global hypokinesis. The left ventricular internal cavity size was mildly dilated. Left ventricular diastolic parameters are indeterminate.  2. Right ventricular systolic function is normal. The right ventricular size is normal. There is moderately elevated pulmonary artery systolic pressure. The estimated right ventricular systolic pressure is 51.2 mmHg.  3. The mitral valve is normal in structure. Mild mitral valve regurgitation. No evidence of mitral stenosis.  4. The aortic valve is tricuspid. Aortic valve regurgitation is not visualized. Aortic valve sclerosis is present, with no evidence of aortic valve stenosis.  5. The inferior vena cava is normal in size with <50% respiratory variability, suggesting right atrial pressure of 8 mmHg.  6. Rhythm is atrial fibrillation rate 102 to 130 bpm FINDINGS  Left Ventricle: Left ventricular ejection fraction, by estimation, is 30 to 35%. Left ventricular ejection fraction by PLAX is 34 %. The left ventricle has moderately decreased function. The left ventricle demonstrates global hypokinesis. The left ventricular internal cavity size was mildly dilated. There is no left ventricular hypertrophy. Left  ventricular diastolic parameters are indeterminate. Right Ventricle: The right ventricular size is normal. No increase in right ventricular wall thickness. Right ventricular systolic function is normal. There is moderately elevated pulmonary artery systolic pressure. The tricuspid regurgitant velocity is 3.40 m/s, and with an assumed right atrial pressure of 5 mmHg, the estimated right ventricular systolic pressure is 51.2 mmHg. Left Atrium: Left atrial size was normal in size. Right Atrium: Right atrial size was normal in size. Pericardium: There is no evidence of pericardial effusion. Mitral Valve: The mitral valve is normal in structure. Mild mitral valve regurgitation. No evidence of mitral valve stenosis. Tricuspid Valve: The tricuspid valve is normal in structure. Tricuspid valve regurgitation is mild . No evidence of tricuspid stenosis. Aortic Valve: The aortic valve is tricuspid. Aortic valve regurgitation is not visualized. Aortic valve sclerosis is present, with no evidence of aortic valve stenosis. Aortic valve peak gradient measures 10.8 mmHg. Pulmonic Valve: The pulmonic valve was normal in structure. Pulmonic valve regurgitation is not visualized. No evidence of pulmonic stenosis. Aorta: The aortic root is normal in size and structure. Venous: The inferior vena cava is normal in size with less than 50% respiratory variability, suggesting right atrial pressure of 8 mmHg. IAS/Shunts: No atrial level shunt detected by color flow Doppler. Additional Comments: A device lead is visualized.  LEFT VENTRICLE PLAX 2D LV EF:         Left            Diastology                ventricular     LV e' medial:    10.30 cm/s  ejection        LV E/e' medial:  11.2                fraction by     LV e' lateral:   16.20 cm/s                PLAX is 34      LV E/e' lateral: 7.1                %. LVIDd:         5.00 cm LVIDs:         4.20 cm LV PW:         1.10 cm LV IVS:        1.20 cm LVOT diam:     1.80 cm LV  SV:         36 LV SV Index:   20 LVOT Area:     2.54 cm  LV Volumes (MOD) LV vol d, MOD    102.0 ml A4C: LV vol s, MOD    56.2 ml A4C: LV SV MOD A4C:   102.0 ml RIGHT VENTRICLE RV Basal diam:  2.70 cm RV S prime:     13.50 cm/s TAPSE (M-mode): 1.5 cm LEFT ATRIUM           Index        RIGHT ATRIUM           Index LA diam:      4.20 cm 2.30 cm/m   RA Area:     17.10 cm LA Vol (A2C): 44.0 ml 24.13 ml/m  RA Volume:   46.10 ml  25.28 ml/m LA Vol (A4C): 55.6 ml 30.49 ml/m  AORTIC VALVE                 PULMONIC VALVE AV Area (Vmax): 1.51 cm     RVOT Peak grad: 3 mmHg AV Vmax:        164.00 cm/s AV Peak Grad:   10.8 mmHg LVOT Vmax:      97.40 cm/s LVOT Vmean:     61.300 cm/s LVOT VTI:       0.141 m  AORTA Ao Root diam: 3.50 cm MITRAL VALVE                TRICUSPID VALVE MV Area (PHT): 3.21 cm     TR Peak grad:   46.2 mmHg MV Decel Time: 236 msec     TR Vmax:        340.00 cm/s MV E velocity: 115.00 cm/s                             SHUNTS                             Systemic VTI:  0.14 m                             Systemic Diam: 1.80 cm Julien Nordmann MD Electronically signed by Julien Nordmann MD Signature Date/Time: 11/10/2022/6:47:31 PM    Final    CT CHEST WO CONTRAST  Result Date: 11/10/2022 CLINICAL DATA:  Chest pain and shortness of breath. EXAM: CT CHEST WITHOUT CONTRAST TECHNIQUE: Multidetector CT imaging of the chest was performed following the standard protocol without IV contrast. RADIATION DOSE REDUCTION: This exam was performed  according to the departmental dose-optimization program which includes automated exposure control, adjustment of the mA and/or kV according to patient size and/or use of iterative reconstruction technique. COMPARISON:  Chest x-ray from same day. CT chest dated October 15, 2022. FINDINGS: Cardiovascular: Unchanged left chest wall pacemaker. Unchanged mild cardiomegaly. No pericardial effusion. No thoracic aortic aneurysm. Coronary, aortic arch, and branch vessel atherosclerotic  vascular disease. Mediastinum/Nodes: Enlarged right paratracheal lymph node currently measuring 1.5 cm in short axis, previously 1.1 cm. Additional subcentimeter mediastinal and right hilar lymph nodes are unchanged. No enlarged axillary lymph nodes. The thyroid gland, trachea, and esophagus demonstrate no significant findings. Lungs/Pleura: Small bilateral pleural effusions. Scattered mild smooth interlobular septal thickening. New scattered ill-defined ground-glass densities and denser nodular opacities in both upper lobes, the right middle lobe, and the anterior left lower lobe. Few new small nodules in the superior segment of the right lower lobe. Subsegmental atelectasis in both posterior lower lobes. No pneumothorax. Unchanged 6 mm nodule left lower lobe (series 3, image 83). Unchanged 5 mm nodule in the right lower lobe (series 3, image 89). The previously seen ground-glass nodule in the right upper lobe is no longer identified. Mild centrilobular emphysema. Upper Abdomen: No acute abnormality. Unchanged tiny gallstone. Partially visualized complex cyst in the inferior right liver, as seen on prior studies. Musculoskeletal: No acute or significant osseous findings. IMPRESSION: 1. New scattered ill-defined ground-glass densities and denser nodular opacities in both upper lobes, the right middle lobe, and the anterior left lower lobe, concerning for multifocal pneumonia. 2. Small bilateral pleural effusions with mild interstitial pulmonary edema. 3. Enlarged right paratracheal lymph node currently measuring 1.5 cm in short axis, previously 1.1 cm, likely reactive. 4. Previously seen ground-glass nodule in the right upper lobe is no longer identified. Other previously identified small pulmonary nodules in both lower lobes are unchanged. 5. Aortic Atherosclerosis (ICD10-I70.0) and Emphysema (ICD10-J43.9). Electronically Signed   By: Obie Dredge M.D.   On: 11/10/2022 14:00   DG Chest 2 View  Result Date:  11/10/2022 CLINICAL DATA:  Shortness of breath. EXAM: CHEST - 2 VIEW COMPARISON:  10/30/22 FINDINGS: Left chest wall ICD noted with leads in the right atrial appendage and right ventricle. Heart size is normal. Aortic atherosclerosis. Small bilateral posterior layering pleural effusions are identified on the lateral projection radiograph. Increased pulmonary vascularity. No frank edema. No airspace consolidation. IMPRESSION: 1. Small bilateral pleural effusions and increased pulmonary vascularity concerning for early CHF. Electronically Signed   By: Signa Kell M.D.   On: 11/10/2022 10:04   DG Chest Port 1 View  Result Date: 11/04/2022 CLINICAL DATA:  Cough EXAM: PORTABLE CHEST 1 VIEW COMPARISON:  X-ray 10/30/2022 FINDINGS: Underinflation compared to the prior x-ray. Stable cardiopericardial silhouette with calcified aorta. Left upper chest defibrillator with leads overlying the right side of the heart. Vascular congestion. No pneumothorax or effusion. Question trace edema. No separate consolidation IMPRESSION: Vascular congestion with question trace edema. Defibrillator. Decreased inflation compared to prior x-ray.  Recommend follow-up Electronically Signed   By: Karen Kays M.D.   On: 11/04/2022 10:38   DG Pelvis Portable  Result Date: 10/31/2022 CLINICAL DATA:  Bipolar right hip hemiarthroplasty. EXAM: PORTABLE PELVIS 1-2 VIEWS COMPARISON:  CT right hip 11/02/2022 FINDINGS: Single frontal view of the bilateral hips. Interval bipolar right hip hemiarthroplasty with normal alignment. Expected postoperative subcutaneous air about the right hip. Mild superomedial left femoroacetabular joint space narrowing. Surgical clips overlie the pelvis. No acute fracture or dislocation. IMPRESSION: Interval bipolar right  hip hemiarthroplasty with normal alignment. Electronically Signed   By: Neita Garnet M.D.   On: 10/31/2022 16:23   CT Hip Right Wo Contrast  Result Date: 10/30/2022 CLINICAL DATA:  Right femoral  fracture EXAM: CT OF THE RIGHT HIP WITHOUT CONTRAST TECHNIQUE: Multidetector CT imaging of the right hip was performed according to the standard protocol. Multiplanar CT image reconstructions were also generated. RADIATION DOSE REDUCTION: This exam was performed according to the departmental dose-optimization program which includes automated exposure control, adjustment of the mA and/or kV according to patient size and/or use of iterative reconstruction technique. COMPARISON:  Right hips and pelvis radiograph dated 10/30/2022 FINDINGS: Bones/Joint/Cartilage Acute transcervical fracture of the right proximal femur with apex anterior angulation. The right femoroacetabular joint is intact. Ligaments Suboptimally assessed by CT. Muscles and Tendons Grossly intact. Soft tissues Vascular calcifications of the partially imaged right lower extremity arteries. IMPRESSION: Mildly angulated right femoral transcervical fracture. Electronically Signed   By: Agustin Cree M.D.   On: 10/30/2022 20:49   DG Knee 2 Views Right  Result Date: 10/30/2022 CLINICAL DATA:  Right knee pain, fall EXAM: RIGHT KNEE - 1-2 VIEW COMPARISON:  None Available. FINDINGS: Moderate tricompartment degenerative changes with joint space narrowing and spurring. No joint effusion. No acute bony abnormality. Specifically, no fracture, subluxation, or dislocation. IMPRESSION: Moderate degenerative changes.  No acute bony abnormality. Electronically Signed   By: Charlett Nose M.D.   On: 10/30/2022 20:25   DG Hip Unilat W or Wo Pelvis 2-3 Views Right  Result Date: 10/30/2022 CLINICAL DATA:  Fall, right hip pain EXAM: DG HIP (WITH OR WITHOUT PELVIS) 2-3V RIGHT COMPARISON:  None Available. FINDINGS: There is foreshortening of the right femoral neck with cortical irregularity noted laterally concerning for right femoral neck fracture. No subluxation or dislocation. Mild symmetric degenerative changes in the hips with joint space narrowing and spurring.  IMPRESSION: Foreshortening of the right femoral neck with cortical irregularity laterally concerning for femoral neck fracture. Electronically Signed   By: Charlett Nose M.D.   On: 10/30/2022 20:24   DG Chest Portable 1 View  Result Date: 10/30/2022 CLINICAL DATA:  Fall, right hip pain EXAM: PORTABLE CHEST 1 VIEW COMPARISON:  09/14/2022 FINDINGS: Left AICD remains in place, unchanged. Heart is normal size. Mediastinal contours within normal limits. No confluent opacities or effusions. No acute bony abnormality. IMPRESSION: No active disease. Electronically Signed   By: Charlett Nose M.D.   On: 10/30/2022 20:22   CT HEAD WO CONTRAST ( )  Result Date: 10/30/2022 CLINICAL DATA:  Trip and fall injury. EXAM: CT HEAD WITHOUT CONTRAST CT CERVICAL SPINE WITHOUT CONTRAST TECHNIQUE: Multidetector CT imaging of the head and cervical spine was performed following the standard protocol without intravenous contrast. Multiplanar CT image reconstructions of the cervical spine were also generated. RADIATION DOSE REDUCTION: This exam was performed according to the departmental dose-optimization program which includes automated exposure control, adjustment of the mA and/or kV according to patient size and/or use of iterative reconstruction technique. COMPARISON:  Head CT without contrast 04/17/2022, cervical spine CT 03/27/2022. FINDINGS: CT HEAD FINDINGS Brain: There is mild atrophy, small-vessel disease and atrophic ventriculomegaly. No new asymmetry is seen concerning for an acute infarct, hemorrhage or mass. There is no midline shift. The basal cisterns are clear. Vascular: There are patchy calcifications of the carotid siphons but no hyperdense central vessels. Skull: No fracture or focal lesions.  Mild calvarial osteopenia. Sinuses/Orbits: No acute findings. Clear visualized sinuses and mastoid air cells. Negative orbits apart from  old lens replacements. Other: None. CT CERVICAL SPINE FINDINGS Alignment: Unchanged. There  is a trace 2 mm C3-4 retrolisthesis, similar minimal C4-5 anterolisthesis and trace C5-6 and C6-7 retrolisthesis all believed degenerative and all unchanged. No traumatic listhesis is seen. There is bone-on-bone anterior atlantodental joint space loss with osteophytes, and degenerative cystic changes in the odontoid process. Skull base and vertebrae: No acute fracture is evident. Bone mineralization is osteopenic. No primary bone lesion or focal pathologic process is seen. Soft tissues and spinal canal: No prevertebral fluid or swelling. No visible canal hematoma. There are calcifications in both proximal cervical ICAs, heaviest on the left where there is probably a flow-limiting origin stenosis. No laryngeal or thyroid mass. Disc levels: There is moderate disc space loss again at C3-4, C5-6 and C6-7, mild disc narrowing at C3-4, normal disc heights at C2-3 and C7-T1. There are small bidirectional endplate spurs from C3-4 through C6-7, causing partial effacement of the ventral CSF without spondylotic cord compression. Facet joint spurring on the left-greater-than-right is seen at most levels with bilateral uncinate spurring. Acquired foraminal stenosis is mild on the right at C2-3, severe on the left and moderate on the right at C3-4, bilaterally mild-to-moderate C6-7, and not seen at the remaining levels. Upper chest: Pacemaker wiring is partially visible entering from the left. Otherwise negative. Other: None. IMPRESSION: 1. No acute intracranial CT findings or depressed skull fractures. 2. Osteopenia and degenerative change without evidence of cervical fractures or traumatic listhesis. 3. 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 Follow-up as indicated. Electronically Signed   By: Almira Bar M.D.   On: 10/30/2022 20:15   CT Cervical Spine Wo Contrast  Result Date: 10/30/2022 CLINICAL DATA:  Trip and fall injury. EXAM: CT HEAD WITHOUT CONTRAST CT CERVICAL  SPINE WITHOUT CONTRAST TECHNIQUE: Multidetector CT imaging of the head and cervical spine was performed following the standard protocol without intravenous contrast. Multiplanar CT image reconstructions of the cervical spine were also generated. RADIATION DOSE REDUCTION: This exam was performed according to the departmental dose-optimization program which includes automated exposure control, adjustment of the mA and/or kV according to patient size and/or use of iterative reconstruction technique. COMPARISON:  Head CT without contrast 04/17/2022, cervical spine CT 03/27/2022. FINDINGS: CT HEAD FINDINGS Brain: There is mild atrophy, small-vessel disease and atrophic ventriculomegaly. No new asymmetry is seen concerning for an acute infarct, hemorrhage or mass. There is no midline shift. The basal cisterns are clear. Vascular: There are patchy calcifications of the carotid siphons but no hyperdense central vessels. Skull: No fracture or focal lesions.  Mild calvarial osteopenia. Sinuses/Orbits: No acute findings. Clear visualized sinuses and mastoid air cells. Negative orbits apart from old lens replacements. Other: None. CT CERVICAL SPINE FINDINGS Alignment: Unchanged. There is a trace 2 mm C3-4 retrolisthesis, similar minimal C4-5 anterolisthesis and trace C5-6 and C6-7 retrolisthesis all believed degenerative and all unchanged. No traumatic listhesis is seen. There is bone-on-bone anterior atlantodental joint space loss with osteophytes, and degenerative cystic changes in the odontoid process. Skull base and vertebrae: No acute fracture is evident. Bone mineralization is osteopenic. No primary bone lesion or focal pathologic process is seen. Soft tissues and spinal canal: No prevertebral fluid or swelling. No visible canal hematoma. There are calcifications in both proximal cervical ICAs, heaviest on the left where there is probably a flow-limiting origin stenosis. No laryngeal or thyroid mass. Disc levels: There is  moderate disc space loss again at C3-4, C5-6 and C6-7, mild disc  narrowing at C3-4, normal disc heights at C2-3 and C7-T1. There are small bidirectional endplate spurs from C3-4 through C6-7, causing partial effacement of the ventral CSF without spondylotic cord compression. Facet joint spurring on the left-greater-than-right is seen at most levels with bilateral uncinate spurring. Acquired foraminal stenosis is mild on the right at C2-3, severe on the left and moderate on the right at C3-4, bilaterally mild-to-moderate C6-7, and not seen at the remaining levels. Upper chest: Pacemaker wiring is partially visible entering from the left. Otherwise negative. Other: None. IMPRESSION: 1. No acute intracranial CT findings or depressed skull fractures. 2. Osteopenia and degenerative change without evidence of cervical fractures or traumatic listhesis. 3. 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 Follow-up as indicated. Electronically Signed   By: Almira Bar M.D.   On: 10/30/2022 20:15    Microbiology: Results for orders placed or performed during the hospital encounter of 11/10/22  SARS Coronavirus 2 by RT PCR (hospital order, performed in [facility] hospital lab) *cepheid single result test* Anterior Nasal Swab     Status: None   Collection Time: 11/10/22  9:20 AM   Specimen: Anterior Nasal Swab  Result Value Ref Range Status   SARS Coronavirus 2 by RT PCR NEGATIVE NEGATIVE Final    Comment: (NOTE) SARS-CoV-2 target nucleic acids are NOT DETECTED.  The SARS-CoV-2 RNA is generally detectable in upper and lower respiratory specimens during the acute phase of infection. The lowest concentration of SARS-CoV-2 viral copies this assay can detect is 250 copies / mL. A negative result does not preclude SARS-CoV-2 infection and should not be used as the sole basis for treatment or other patient management decisions.  A negative result may occur  with improper specimen collection / handling, submission of specimen other than nasopharyngeal swab, presence of viral mutation(s) within the areas targeted by this assay, and inadequate number of viral copies (<250 copies / mL). A negative result must be combined with clinical observations, patient history, and epidemiological information.  Fact Sheet for Patients:   RoadLapTop.co.za  Fact Sheet for Healthcare Providers: http://kim-miller.com/  This test is not yet approved or  cleared by the [place] FDA and has been authorized for detection and/or diagnosis of SARS-CoV-2 by FDA under an Emergency Use Authorization (EUA).  This EUA will remain in effect (meaning this test can be used) for the duration of the COVID-19 declaration under Section 564(b)(1) of the Act, 21 U.S.C. section 360bbb-3(b)(1), unless the authorization is terminated or revoked sooner.  Performed at [facility], [address] [place], [place] [address]   Respiratory (~20 pathogens) panel by PCR     Status: Abnormal   Collection Time: 11/10/22 12:47 PM   Specimen: Nasopharyngeal Swab; Respiratory  Result Value Ref Range Status   Adenovirus NOT DETECTED NOT DETECTED Final   Coronavirus 229E NOT DETECTED NOT DETECTED Final    Comment: (NOTE) The Coronavirus on the Respiratory Panel, DOES NOT test for the novel  Coronavirus (2019 nCoV)    Coronavirus HKU1 NOT DETECTED NOT DETECTED Final   Coronavirus NL63 NOT DETECTED NOT DETECTED Final   Coronavirus OC43 NOT DETECTED NOT DETECTED Final   Metapneumovirus NOT DETECTED NOT DETECTED Final   Rhinovirus / Enterovirus DETECTED (A) NOT DETECTED Final   Influenza A NOT DETECTED NOT DETECTED Final   Influenza B NOT DETECTED NOT DETECTED Final   Parainfluenza Virus 1 NOT DETECTED NOT DETECTED Final   Parainfluenza Virus 2 NOT DETECTED NOT DETECTED  Final   Parainfluenza Virus 3 NOT DETECTED NOT DETECTED  Final   Parainfluenza Virus 4 NOT DETECTED NOT DETECTED Final   Respiratory Syncytial Virus NOT DETECTED NOT DETECTED Final   Bordetella pertussis NOT DETECTED NOT DETECTED Final   Bordetella Parapertussis NOT DETECTED NOT DETECTED Final   Chlamydophila pneumoniae NOT DETECTED NOT DETECTED Final   Mycoplasma pneumoniae NOT DETECTED NOT DETECTED Final    Comment: Performed at [facility], [address] [place], [place] [address]  MRSA Next Gen by PCR, Nasal     Status: Abnormal   Collection Time: 11/10/22 12:47 PM   Specimen: Nasopharyngeal Swab; Nasal Swab  Result Value Ref Range Status   MRSA by PCR Next Gen DETECTED (A) NOT DETECTED Final    Comment: RESULT CALLED TO, READ BACK BY AND VERIFIED WITH: BRANDON GROGG AT 1452 11/10/22.PMF (NOTE) The GeneXpert MRSA Assay (FDA approved for NASAL specimens only), is one component of a comprehensive MRSA colonization surveillance program. It is not intended to diagnose MRSA infection nor to guide or monitor treatment for MRSA infections. Test performance is not FDA approved in patients less than 58 years old. Performed at [facility], [address] [place], [place] [address]   Culture, blood (Routine X 2) w Reflex to ID Panel     Status: None   Collection Time: 11/11/22  8:10 AM   Specimen: BLOOD  Result Value Ref Range Status   Specimen Description BLOOD BLOOD LEFT ARM  Final   Special Requests Blood Culture adequate volume  Final   Culture   Final    NO GROWTH 5 DAYS Performed at [facility], [address] [place], [place] [address]    Report Status 11/16/2022 FINAL  Final  Culture, blood (Routine X 2) w Reflex to ID Panel     Status: None   Collection Time: 11/11/22  8:10 AM   Specimen: BLOOD  Result Value Ref Range Status   Specimen Description BLOOD BLOOD LEFT HAND  Final   Special Requests   Final    BOTTLES DRAWN AEROBIC AND ANAEROBIC Blood Culture results may not be optimal due to an  inadequate volume of blood received in culture bottles   Culture   Final    NO GROWTH 5 DAYS Performed at [facility], [address] [place], [place] [address]    Report Status 11/16/2022 FINAL  Final    Labs: CBC: Recent Labs  Lab 11/12/22 0529 11/13/22 0536 11/15/22 0337  WBC 18.1* 13.8* 7.6  HGB 9.2* 8.8* 9.9*  HCT 28.5* 27.7* 30.9*  MCV 90.5 91.4 91.2  PLT 335 343 360   Basic Metabolic Panel: Recent Labs  Lab 11/12/22 0529 11/13/22 0536 11/14/22 0548 11/15/22 0337 11/18/22 1017  NA 135 135 131* 130*  --   K 4.4 4.7 4.2 4.6 3.7  CL 98 94* 93* 91*  --   CO2 26 32 30 32  --   GLUCOSE 153* 121* 165* 106*  --   BUN 33* 41* 38* 35*  --   CREATININE 1.34* 1.51* 1.34* 1.45*  --   CALCIUM 8.9 9.0 8.3* 8.2*  --   MG 2.1  --   --   --  2.1   Liver Function Tests: Recent Labs  Lab 11/12/22 0529 11/14/22 0548 11/15/22 0337  AST 264* 113* 77*  ALT 249* 205* 174*  ALKPHOS 72 70 73  BILITOT 0.6 0.6 0.7  PROT 6.0* 5.1* 5.1*  ALBUMIN 2.9* 2.6*  2.7*   CBG: No results for input(s): "GLUCAP" in the last 168 hours.  Discharge time spent: greater than 30 minutes.  Signed: Enedina Finner, MD [facility] 11/18/2022

## 2022-11-18 NOTE — [place] FL2 (Signed)
[place] MEDICAID FL2 LEVEL OF CARE FORM     IDENTIFICATION  Patient Name: Matthew Zamora Birthdate: March 31, 1941 Sex: male Admission Date (Current Location): 11/10/2022  [facility] and [place] Number:  Chiropodist and Address:  [facility], [address] [place], [place] [address]      Provider Number: [id number]  Attending Physician Name and Address:  Enedina Finner, MD  Relative Name and Phone Number:  Lamberto, Elgart (Son) [phone] [facility])    Current Level of Care: Hospital Recommended Level of Care: Skilled Nursing Facility Prior Approval Number:    Date Approved/Denied:   PASRR Number: [id number]  Discharge Plan: SNF    Current Diagnoses: Patient Active Problem List   Diagnosis Date Noted   Fall 11/06/2022   Closed displaced fracture of right femoral neck ([name]) 10/31/2022   Osteoporosis 10/30/2022   Femoral fracture ([name]) 10/30/2022   Epistaxis 05/12/2022   Protein-calorie malnutrition, severe 05/08/2022   Enteritis, enteropathogenic E. coli 04/29/2022   Diarrhea 04/28/2022   Multifocal pneumonia 04/23/2022   Erosive esophagitis 04/22/2022   Dizziness 04/21/2022   Intractable nausea and vomiting 04/21/2022   Dyspnea 04/20/2022   Hyponatremia 04/19/2022   Orthostatic hypotension 04/18/2022   Postural dizziness with near syncope 04/18/2022   Near syncope 04/17/2022   COPD (chronic obstructive pulmonary disease) ([name]) 04/17/2022   Hyperkalemia 04/17/2022   Abnormal LFTs 04/17/2022   Chronic systolic CHF (congestive heart failure) ([name]) 04/17/2022   Normocytic anemia 04/17/2022   GERD without esophagitis 02/12/2022   Recurrent falls    Generalized weakness    NSVT (nonsustained ventricular tachycardia) ([name]) 02/11/2022   Acute on chronic combined systolic (congestive) and diastolic (congestive) heart failure ([name]) 08/10/2021   Acute respiratory failure with hypoxia ([name])    HCAP (healthcare-associated pneumonia)  06/13/2021   Acute on chronic systolic CHF (congestive heart failure) ([name]) 06/13/2021   Elevated troponin 06/13/2021   CKD (chronic kidney disease), 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   Lung nodule 06/13/2021   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   COPD exacerbation ([name]) 06/13/2021   Volume depletion 12/15/2019   Hypotension 12/15/2019   Cardiorenal syndrome    Atrial fibrillation with RVR ([name])    Defibrillator discharge    Tachycardia 10/25/2019   CHF (congestive heart failure) ([name]) 09/18/2019   Acute on chronic combined systolic and diastolic CHF (congestive heart failure) ([name]) 09/17/2019   CAD (coronary artery disease) 09/17/2019   Ischemic cardiomyopathy 09/17/2019   CKD (chronic kidney disease) stage 3, GFR 30-59 ml/min ([name]) 09/17/2019   Acute hypoxemic respiratory failure ([name]) 09/17/2018   AAA (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 ([name]) 07/06/2018   Essential hypertension 07/06/2018   Hyperlipidemia 07/06/2018   Paroxysmal atrial fibrillation ([name])    Acute on chronic respiratory failure with hypoxia ([name]) 04/26/2018    Orientation RESPIRATION BLADDER Height & Weight     Self, Time, Situation, Place  Normal (O22L) External catheter Weight: 66.3 kg Height:  5\' 8"  (172.7 cm)  BEHAVIORAL SYMPTOMS/MOOD NEUROLOGICAL BOWEL NUTRITION STATUS  Other (Comment) (n/a)  (n/a) Incontinent Diet (Heart)  AMBULATORY STATUS COMMUNICATION OF NEEDS Skin   Extensive Assist Verbally Surgical wounds (R thigh- Honeycomb dressing)                       Personal Care Assistance Level of Assistance  Bathing, Feeding, Dressing Bathing Assistance: Limited assistance Feeding assistance: Limited assistance Dressing Assistance: Limited assistance Total Care Assistance: Limited assistance   Functional Limitations Info  Sight, Hearing Sight Info: Impaired Hearing  Info: Impaired Speech Info: Adequate    SPECIAL CARE FACTORS FREQUENCY  PT (By licensed PT), OT (By licensed OT)     PT Frequency: Min  2xweekly OT Frequency: Min 2x weekly            Contractures Contractures Info: Not present    Additional Factors Info  Code Status Code Status Info: FULL Allergies Info: Nitrofuran Derivatives, Spironolactone           Current Medications (11/18/2022):  This is the current hospital active medication list Current Facility-Administered Medications  Medication Dose Route Frequency Provider Last Rate Last Admin   acetaminophen (TYLENOL) tablet 650 mg  650 mg Oral Q6H PRN Enedina Finner, MD       alum & mag hydroxide-simeth (MAALOX/MYLANTA) 200-200-20 MG/5ML suspension 30 mL  30 mL Oral Q4H PRN Meredeth Ide, MD   30 mL at 11/18/22 1228   apixaban (ELIQUIS) tablet 2.5 mg  2.5 mg Oral BID Jaynie Bream, RPH   2.5 mg at 11/18/22 1610   ascorbic acid (VITAMIN C) tablet 500 mg  500 mg Oral Daily Enedina Finner, MD   500 mg at 11/18/22 0904   atorvastatin (LIPITOR) tablet 80 mg  80 mg Oral QPM Enedina Finner, MD   80 mg at 11/17/22 1703   calcium carbonate (TUMS - dosed in mg elemental calcium) chewable tablet 200 mg of elemental calcium  1 tablet Oral Daily Enedina Finner, MD   200 mg of elemental calcium at 11/18/22 0904   [START ON 11/19/2022] furosemide (LASIX) tablet 20 mg  20 mg Oral Daily Tang, Cheryln Manly, PA-C       gabapentin (NEURONTIN) capsule 100 mg  100 mg Oral QHS Enedina Finner, MD   100 mg at 11/17/22 2134   guaiFENesin (MUCINEX) 12 hr tablet 600 mg  600 mg Oral BID Meredeth Ide, MD   600 mg at 11/18/22 0904   guaiFENesin-dextromethorphan (ROBITUSSIN DM) 100-10 MG/5ML syrup 5 mL  5 mL Oral Q4H PRN Enedina Finner, MD   5 mL at 11/16/22 1545   HYDROcodone-acetaminophen (NORCO/VICODIN) 5-325 MG per tablet 1-2 tablet  1-2 tablet Oral Q4H PRN Enedina Finner, MD   2 tablet at 11/16/22 2327   ipratropium (ATROVENT) nebulizer solution 0.5 mg  0.5 mg Nebulization Q6H PRN Meredeth Ide, MD       levalbuterol Pauline Aus) nebulizer solution 0.63 mg  0.63 mg Nebulization Q6H PRN Enedina Finner, MD        magnesium oxide (MAG-OX) tablet 400 mg  400 mg Oral Daily Enedina Finner, MD   400 mg at 11/18/22 0904   metoprolol tartrate (LOPRESSOR) injection 2.5 mg  2.5 mg Intravenous Q6H PRN Meredeth Ide, MD   2.5 mg at 11/12/22 1000   metoprolol tartrate (LOPRESSOR) tablet 25 mg  25 mg Oral BID Tang, Cheryln Manly, PA-C       midodrine (PROAMATINE) tablet 5 mg  5 mg Oral TID WC Hudson, Caralyn, PA-C   5 mg at 11/18/22 1228   mometasone-formoterol (DULERA) 200-5 MCG/ACT inhaler 2 puff  2 puff Inhalation BID Enedina Finner, MD   2 puff at 11/18/22 0906   ondansetron (ZOFRAN) tablet 4 mg  4 mg Oral Q6H PRN Lurline Del, MD   4 mg at 11/14/22 2052   Or   ondansetron (ZOFRAN) injection 4 mg  4 mg Intravenous Q6H PRN Lurline Del, MD       Oral care mouth rinse  15 mL Mouth Rinse PRN  Lurline Del, MD       pantoprazole (PROTONIX) EC tablet 40 mg  40 mg Oral BID Meredeth Ide, MD   40 mg at 11/18/22 0904   pyridostigmine (MESTINON) tablet 60 mg  60 mg Oral Q8H Enedina Finner, MD   60 mg at 11/18/22 1233   sertraline (ZOLOFT) tablet 25 mg  25 mg Oral Daily Enedina Finner, MD   25 mg at 11/18/22 0904   sucralfate (CARAFATE) 1 GM/10ML suspension 1 g  1 g Oral TID WC & HS Meredeth Ide, MD   1 g at 11/18/22 1233   tamsulosin (FLOMAX) capsule 0.4 mg  0.4 mg Oral Daily Meredeth Ide, MD   0.4 mg at 11/18/22 1610     Discharge Medications: Please see discharge summary for a list of discharge medications.  Relevant Imaging Results:  Relevant Lab Results:   Additional Information SSN: [id number]  Truddie Hidden, RN

## 2022-11-18 NOTE — TOC Transition Note (Signed)
Transition of Care [facility]) - CM/SW Discharge Note   Patient Details  Name: Matthew Zamora MRN: [id number] Date of Birth: Sep 22, 1940  [facility] CM/SW Contact:  Truddie Hidden, RN Phone Number: 11/18/2022, 2:31 PM   Clinical Narrative:    Spoke with Ricky in admissions. Per facility patient admissions confirm for today. Patient assigned room # E-8 Nurse will call report to [phone] Nurse notified  Left patient's son a message Face sheet and medical necessity forms printed to the floor to be added to the EMS pack EMS arranged  Discharge summary and SNF tranfer report sent in HUB.  TOC signing off.          Patient Goals and CMS Choice      Discharge Placement                         Discharge Plan and Services Additional resources added to the After Visit Summary for                                       Social Determinants of Health (SDOH) Interventions SDOH Screenings   Food Insecurity: No Food Insecurity (11/10/2022)  Housing: Low Risk  (11/10/2022)  Transportation Needs: No Transportation Needs (11/10/2022)  Utilities: Not At Risk (11/10/2022)  Tobacco Use: Medium Risk (11/10/2022)     Readmission Risk Interventions    04/22/2022    3:54 PM  Readmission Risk Prevention Plan  Transportation Screening Complete  Medication Review (RN Care Manager) Complete  PCP or Specialist appointment within 3-5 days of discharge Complete  HRI or Home Care Consult Complete  SW Recovery Care/Counseling Consult Complete  Palliative Care Screening Not Applicable  Skilled Nursing Facility Not Applicable

## 2022-11-18 NOTE — Progress Notes (Signed)
Report given to [employer] [employer].  EMS picked up patient at 1615.  Pt incont prior to discharge.  Incont care provided.  Pt has all belongings.  DC paperwork given to EMS.  Pt left unit without incident

## 2022-11-18 NOTE — Progress Notes (Signed)
[facility] CARDIOLOGY CONSULT NOTE       Patient ID: Matthew Zamora MRN: [id number] DOB/AGE: 1940/10/24 82 y.o.  Admit date: 11/10/2022 Referring Physician Dr. Skip Mayer Primary Physician Dr. 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 During that admission the patient's defibrillator shocked him for VF, subsequently loaded with amiodarone. He was discharged to a SNF on 5/23. He presented back to [facility] ED on 5/26 with shortness of breath. Cardiology consulted for assistance with his CHF.   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 pylori infection    History of hepatitis    Hyperlipidemia    Hypertension    Liver cyst 05/16/15   PAF (paroxysmal atrial fibrillation) ([name])    Prostate CA ([name])     Past Surgical History:  Procedure Laterality Date   CATARACT  EXTRACTION     COLONOSCOPY  10/06/2014, 09/18/2004, 03/14/2009   ESOPHAGOGASTRODUODENOSCOPY  10/06/2014, 03/18/2001, 03/14/2009   ESOPHAGOGASTRODUODENOSCOPY (EGD) WITH PROPOFOL N/A 05/07/2018   Procedure: ESOPHAGOGASTRODUODENOSCOPY (EGD) WITH PROPOFOL;  Surgeon: Toledo, Boykin Nearing, MD;  Location: [facility] ENDOSCOPY;  Service: Gastroenterology;  Laterality: N/A;   ESOPHAGOGASTRODUODENOSCOPY (EGD) WITH PROPOFOL N/A 04/22/2022   Procedure: ESOPHAGOGASTRODUODENOSCOPY (EGD) WITH PROPOFOL;  Surgeon: Toney Reil, MD;  Location: [facility] ENDOSCOPY;  Service: Endoscopy;  Laterality: N/A;   FLEXIBLE SIGMOIDOSCOPY  08/26/1990   HIP ARTHROPLASTY Right 10/31/2022   Procedure: ARTHROPLASTY BIPOLAR HIP (HEMIARTHROPLASTY)-RNFA;  Surgeon: Reinaldo Berber, MD;  Location: [facility] ORS;  Service: Orthopedics;  Laterality: Right;   INSERTION OF ICD     PROSTATE SURGERY     TONSILLECTOMY      Medications Prior to Admission  Medication Sig Dispense Refill Last Dose   amiodarone (PACERONE) 400 MG tablet Take 1 tablet (400 mg total) by mouth 2 (two) times daily for 10 days. Followed by 400mg  daily 20 tablet 0 11/09/2022 at 2245   ascorbic acid (VITAMIN C) 500 MG tablet Take 1 tablet by mouth daily.   11/09/2022 at 0947   atorvastatin (LIPITOR) 80 MG tablet Take 80 mg by mouth daily.   11/09/2022 at 1731   calcium carbonate (TUMS EX) 750 MG chewable tablet Chew 1 tablet by mouth daily.   11/09/2022 at 0947   ELIQUIS 2.5 MG TABS tablet Take 2.5 mg by mouth 2 (two) times daily.   11/09/2022 at 2245  fluticasone-salmeterol (ADVAIR) 500-50 MCG/ACT AEPB Inhale 1 puff into the lungs in the morning and at bedtime.   11/10/2022 at 1126   gabapentin (NEURONTIN) 100 MG capsule Take 100 mg by mouth at bedtime.   11/09/2022 at 2245   HYDROcodone-acetaminophen (NORCO/VICODIN) 5-325 MG tablet Take 1-2 tablets by mouth every 4 (four) hours as needed for moderate pain (pain score 4-6). 30 tablet 0 11/10/2022 at 1223   magnesium oxide (MAG-OX) 400 (240  Mg) MG tablet Take 1 tablet by mouth daily.   11/09/2022 at 0947   melatonin 3 MG TABS tablet Take 9 mg by mouth at bedtime.   11/09/2022 at 2245   metoprolol tartrate (LOPRESSOR) 25 MG tablet Take 0.5 tablets (12.5 mg total) by mouth 2 (two) times daily.   11/09/2022 at 2245   midodrine (PROAMATINE) 5 MG tablet Take 3 tablets (15 mg total) by mouth 3 (three) times daily with meals.   11/09/2022 at 2245   Multiple Vitamin (MULTIVITAMIN WITH MINERALS) TABS tablet Take 1 tablet by mouth daily.   11/09/2022 at 0947   pantoprazole (PROTONIX) 40 MG tablet Take 1 tablet (40 mg total) by mouth 2 (two) times daily.   11/09/2022 at 1647   polyethylene glycol (MIRALAX / GLYCOLAX) 17 g packet Take 17 g by mouth 2 (two) times daily. 14 each 0 11/09/2022 at 1647   pyridostigmine (MESTINON) 60 MG tablet Take 1 tablet (60 mg total) by mouth every 8 (eight) hours.   11/10/2022 at 0637   sertraline (ZOLOFT) 25 MG tablet Take 25 mg by mouth daily.   11/09/2022 at 0947   sucralfate (CARAFATE) 1 GM/10ML suspension Take 10 mLs (1 g total) by mouth 4 (four) times daily -  with meals and at bedtime. 420 mL 0 11/09/2022 at 2245   tamsulosin (FLOMAX) 0.4 MG CAPS capsule Take 0.4 mg by mouth daily.   11/09/2022 at 0947   acetaminophen (TYLENOL) 325 MG tablet Take 2 tablets (650 mg total) by mouth every 4 (four) hours as needed for headache or mild pain.   11/08/2022 at 1401   albuterol (PROVENTIL) (2.5 MG/3ML) 0.083% nebulizer solution Inhale 3 mLs (2.5 mg total) into the lungs as needed for shortness of breath. 75 mL 12 PRN at PRN   albuterol (VENTOLIN HFA) 108 (90 Base) MCG/ACT inhaler Inhale 2 puffs into the lungs every 4 (four) hours as needed.   11/08/2022 at 1401   amiodarone (PACERONE) 400 MG tablet Take 1 tablet (400 mg total) by mouth daily. To be started after completing 10 days of the twice daily dosing 30 tablet 0    metoCLOPramide (REGLAN) 5 MG tablet Take 1 tablet (5 mg total) by mouth every 8 (eight) hours as needed for  refractory nausea / vomiting.   PRN at PRN   Nebulizer MISC 1 each by Does not apply route as needed. 1 each 0    Nutritional Supplements (,FEEDING SUPPLEMENT, PROSOURCE PLUS) liquid Take 30 mLs by mouth 3 (three) times daily between meals.   11/08/2022 at 1125   ondansetron (ZOFRAN-ODT) 4 MG disintegrating tablet Take 4 mg by mouth every 8 (eight) hours as needed for refractory nausea / vomiting, vomiting or nausea.   PRN at PRN   Social History   Socioeconomic History   Marital status: Single    Spouse name: Not on file   Number of children: Not on file   Years of education: Not on file   Highest education level: Not on file  Occupational History  Not on file  Tobacco Use   Smoking status: Former    Packs/day: 1.00    Years: 38.00    Additional pack years: 0.00    Total pack years: 38.00    Types: Cigarettes    Quit date: 06/18/1999    Years since quitting: 23.4   Smokeless tobacco: Never  Vaping Use   Vaping Use: Never used  Substance and Sexual Activity   Alcohol use: No    Alcohol/week: 0.0 standard drinks of alcohol   Drug use: Not Currently   Sexual activity: Not Currently  Other Topics Concern   Not on file  Social History Narrative   Not on file   Social Determinants of Health   Financial Resource Strain: Not on file  Food Insecurity: No Food Insecurity (11/10/2022)   Hunger Vital Sign    Worried About Running Out of Food in the Last Year: Never true    Ran Out of Food in the Last Year: Never true  Transportation Needs: No Transportation Needs (11/10/2022)   PRAPARE - Administrator, Civil Service (Medical): No    Lack of Transportation (Non-Medical): No  Physical Activity: Not on file  Stress: Not on file  Social Connections: Not on file  Intimate Partner Violence: Not At Risk (11/10/2022)   Humiliation, Afraid, Rape, and Kick questionnaire    Fear of Current or Ex-Partner: No    Emotionally Abused: No    Physically Abused: No    Sexually  Abused: No    Family History  Problem Relation Age of Onset   Heart attack Mother    Heart attack Father       Intake/Output Summary (Last 24 hours) at 11/18/2022 1229 Last data filed at 11/18/2022 1221 Gross per 24 hour  Intake 240 ml  Output 1625 ml  Net -1385 ml     Vitals:   11/18/22 0340 11/18/22 0853 11/18/22 0900 11/18/22 1220  BP: 100/64 102/69  (!) 98/53  Pulse: 89 (!) 52 (!) 105 74  Resp: 17 (!) 21  18  Temp: 98.3 F (36.8 C) 97.8 F (36.6 C)  98.5 F (36.9 C)  TempSrc:    Axillary  SpO2: 100% 100%  100%  Weight:      Height:        PHYSICAL EXAM General: elderly male, well nourished, in no acute distress. Sitting upright in bed HEENT:  Normocephalic and atraumatic. Neck:  No JVD.  Lungs: Normal respiratory effort on 2L [place].  Decreased breath sounds without appreciable crackles or wheezing Heart: irregularly irregular with controlled rate. Normal S1 and S2 without gallops or murmurs.  Abdomen: Non-distended appearing.  Msk: Normal strength and tone for age. Extremities: Warm and well perfused. No clubbing, cyanosis.  No peripheral edema.  Neuro: Alert and oriented X 3. Psych:  Answers questions appropriately.   Labs: Basic Metabolic Panel: Recent Labs    11/18/22 1017  K 3.7  MG 2.1    Liver Function Tests: No results for input(s): "AST", "ALT", "ALKPHOS", "BILITOT", "PROT", "ALBUMIN" in the last 72 hours.  No results for input(s): "LIPASE", "AMYLASE" in the last 72 hours. CBC: No results for input(s): "WBC", "NEUTROABS", "HGB", "HCT", "MCV", "PLT" in the last 72 hours.  Cardiac Enzymes: No results for input(s): "CKTOTAL", "CKMB", "CKMBINDEX", "TROPONINIHS" in the last 72 hours.  BNP: No results for input(s): "BNP" in the last 72 hours.  D-Dimer: No results for input(s): "DDIMER" in the last 72 hours. Hemoglobin A1C: No results for  input(s): "HGBA1C" in the last 72 hours. Fasting Lipid Panel: No results for input(s): "CHOL", "HDL",  "LDLCALC", "TRIG", "CHOLHDL", "LDLDIRECT" in the last 72 hours. Thyroid Function Tests: No results for input(s): "TSH", "T4TOTAL", "T3FREE", "THYROIDAB" in the last 72 hours.  Invalid input(s): "FREET3" Anemia Panel: No results for input(s): "VITAMINB12", "FOLATE", "FERRITIN", "TIBC", "IRON", "RETICCTPCT" in the last 72 hours.   Radiology: ECHOCARDIOGRAM COMPLETE  Result Date: 11/10/2022    ECHOCARDIOGRAM REPORT   Patient Name:   Matthew Zamora Date of Exam: 11/10/2022 Medical Rec #:  [id number]           Height:       68.0 in Accession #:    [id number]          Weight:       153.0 lb Date of Birth:  1940/12/12           BSA:          1.824 m Patient Age:    81 years            BP:           126/82 mmHg Patient Gender: M                   HR:           121 bpm. Exam Location:  [facility] Procedure: 2D Echo Indications:     CHF I50.21  History:         Patient has prior history of Echocardiogram examinations, most                  recent 04/21/2022.  Sonographer:     Overton Mam RDCS, FASE Referring Phys:  [id number] Beulah Gandy A THOMAS Diagnosing Phys: Julien Nordmann MD  Sonographer Comments: Technically difficult study due to poor echo windows and suboptimal parasternal window. Image acquisition challenging due to respiratory motion. IMPRESSIONS  1. Left ventricular ejection fraction, by estimation, is 30 to 35%. Left ventricular ejection fraction by PLAX is 34 %. The left ventricle has moderately decreased function. The left ventricle demonstrates global hypokinesis. The left ventricular internal cavity size was mildly dilated. Left ventricular diastolic parameters are indeterminate.  2. Right ventricular systolic function is normal. The right ventricular size is normal. There is moderately elevated pulmonary artery systolic pressure. The estimated right ventricular systolic pressure is 51.2 mmHg.  3. The mitral valve is normal in structure. Mild mitral valve regurgitation. No evidence of mitral stenosis.   4. The aortic valve is tricuspid. Aortic valve regurgitation is not visualized. Aortic valve sclerosis is present, with no evidence of aortic valve stenosis.  5. The inferior vena cava is normal in size with <50% respiratory variability, suggesting right atrial pressure of 8 mmHg.  6. Rhythm is atrial fibrillation rate 102 to 130 bpm FINDINGS  Left Ventricle: Left ventricular ejection fraction, by estimation, is 30 to 35%. Left ventricular ejection fraction by PLAX is 34 %. The left ventricle has moderately decreased function. The left ventricle demonstrates global hypokinesis. The left ventricular internal cavity size was mildly dilated. There is no left ventricular hypertrophy. Left ventricular diastolic parameters are indeterminate. Right Ventricle: The right ventricular size is normal. No increase in right ventricular wall thickness. Right ventricular systolic function is normal. There is moderately elevated pulmonary artery systolic pressure. The tricuspid regurgitant velocity is 3.40 m/s, and with an assumed right atrial pressure of 5 mmHg, the estimated right ventricular systolic pressure is 51.2 mmHg. Left Atrium: Left atrial size was  normal in size. Right Atrium: Right atrial size was normal in size. Pericardium: There is no evidence of pericardial effusion. Mitral Valve: The mitral valve is normal in structure. Mild mitral valve regurgitation. No evidence of mitral valve stenosis. Tricuspid Valve: The tricuspid valve is normal in structure. Tricuspid valve regurgitation is mild . No evidence of tricuspid stenosis. Aortic Valve: The aortic valve is tricuspid. Aortic valve regurgitation is not visualized. Aortic valve sclerosis is present, with no evidence of aortic valve stenosis. Aortic valve peak gradient measures 10.8 mmHg. Pulmonic Valve: The pulmonic valve was normal in structure. Pulmonic valve regurgitation is not visualized. No evidence of pulmonic stenosis. Aorta: The aortic root is normal in size  and structure. Venous: The inferior vena cava is normal in size with less than 50% respiratory variability, suggesting right atrial pressure of 8 mmHg. IAS/Shunts: No atrial level shunt detected by color flow Doppler. Additional Comments: A device lead is visualized.  LEFT VENTRICLE PLAX 2D LV EF:         Left            Diastology                ventricular     LV e' medial:    10.30 cm/s                ejection        LV E/e' medial:  11.2                fraction by     LV e' lateral:   16.20 cm/s                PLAX is 34      LV E/e' lateral: 7.1                %. LVIDd:         5.00 cm LVIDs:         4.20 cm LV PW:         1.10 cm LV IVS:        1.20 cm LVOT diam:     1.80 cm LV SV:         36 LV SV Index:   20 LVOT Area:     2.54 cm  LV Volumes (MOD) LV vol d, MOD    102.0 ml A4C: LV vol s, MOD    56.2 ml A4C: LV SV MOD A4C:   102.0 ml RIGHT VENTRICLE RV Basal diam:  2.70 cm RV S prime:     13.50 cm/s TAPSE (M-mode): 1.5 cm LEFT ATRIUM           Index        RIGHT ATRIUM           Index LA diam:      4.20 cm 2.30 cm/m   RA Area:     17.10 cm LA Vol (A2C): 44.0 ml 24.13 ml/m  RA Volume:   46.10 ml  25.28 ml/m LA Vol (A4C): 55.6 ml 30.49 ml/m  AORTIC VALVE                 PULMONIC VALVE AV Area (Vmax): 1.51 cm     RVOT Peak grad: 3 mmHg AV Vmax:        164.00 cm/s AV Peak Grad:   10.8 mmHg LVOT Vmax:      97.40 cm/s LVOT Vmean:     61.300 cm/s LVOT VTI:  0.141 m  AORTA Ao Root diam: 3.50 cm MITRAL VALVE                TRICUSPID VALVE MV Area (PHT): 3.21 cm     TR Peak grad:   46.2 mmHg MV Decel Time: 236 msec     TR Vmax:        340.00 cm/s MV E velocity: 115.00 cm/s                             SHUNTS                             Systemic VTI:  0.14 m                             Systemic Diam: 1.80 cm Julien Nordmann MD Electronically signed by Julien Nordmann MD Signature Date/Time: 11/10/2022/6:47:31 PM    Final    CT CHEST WO CONTRAST  Result Date: 11/10/2022 CLINICAL DATA:  Chest pain and  shortness of breath. EXAM: CT CHEST WITHOUT CONTRAST TECHNIQUE: Multidetector CT imaging of the chest was performed following the standard protocol without IV contrast. RADIATION DOSE REDUCTION: This exam was performed according to the departmental dose-optimization program which includes automated exposure control, adjustment of the mA and/or kV according to patient size and/or use of iterative reconstruction technique. COMPARISON:  Chest x-ray from same day. CT chest dated October 15, 2022. FINDINGS: Cardiovascular: Unchanged left chest wall pacemaker. Unchanged mild cardiomegaly. No pericardial effusion. No thoracic aortic aneurysm. Coronary, aortic arch, and branch vessel atherosclerotic vascular disease. Mediastinum/Nodes: Enlarged right paratracheal lymph node currently measuring 1.5 cm in short axis, previously 1.1 cm. Additional subcentimeter mediastinal and right hilar lymph nodes are unchanged. No enlarged axillary lymph nodes. The thyroid gland, trachea, and esophagus demonstrate no significant findings. Lungs/Pleura: Small bilateral pleural effusions. Scattered mild smooth interlobular septal thickening. New scattered ill-defined ground-glass densities and denser nodular opacities in both upper lobes, the right middle lobe, and the anterior left lower lobe. Few new small nodules in the superior segment of the right lower lobe. Subsegmental atelectasis in both posterior lower lobes. No pneumothorax. Unchanged 6 mm nodule left lower lobe (series 3, image 83). Unchanged 5 mm nodule in the right lower lobe (series 3, image 89). The previously seen ground-glass nodule in the right upper lobe is no longer identified. Mild centrilobular emphysema. Upper Abdomen: No acute abnormality. Unchanged tiny gallstone. Partially visualized complex cyst in the inferior right liver, as seen on prior studies. Musculoskeletal: No acute or significant osseous findings. IMPRESSION: 1. New scattered ill-defined ground-glass  densities and denser nodular opacities in both upper lobes, the right middle lobe, and the anterior left lower lobe, concerning for multifocal pneumonia. 2. Small bilateral pleural effusions with mild interstitial pulmonary edema. 3. Enlarged right paratracheal lymph node currently measuring 1.5 cm in short axis, previously 1.1 cm, likely reactive. 4. Previously seen ground-glass nodule in the right upper lobe is no longer identified. Other previously identified small pulmonary nodules in both lower lobes are unchanged. 5. Aortic Atherosclerosis (ICD10-I70.0) and Emphysema (ICD10-J43.9). Electronically Signed   By: Obie Dredge M.D.   On: 11/10/2022 14:00   DG Chest 2 View  Result Date: 11/10/2022 CLINICAL DATA:  Shortness of breath. EXAM: CHEST - 2 VIEW COMPARISON:  10/30/22 FINDINGS: Left chest wall ICD noted with leads in  the right atrial appendage and right ventricle. Heart size is normal. Aortic atherosclerosis. Small bilateral posterior layering pleural effusions are identified on the lateral projection radiograph. Increased pulmonary vascularity. No frank edema. No airspace consolidation. IMPRESSION: 1. Small bilateral pleural effusions and increased pulmonary vascularity concerning for early CHF. Electronically Signed   By: Signa Kell M.D.   On: 11/10/2022 10:04   DG Chest Port 1 View  Result Date: 11/04/2022 CLINICAL DATA:  Cough EXAM: PORTABLE CHEST 1 VIEW COMPARISON:  X-ray 10/30/2022 FINDINGS: Underinflation compared to the prior x-ray. Stable cardiopericardial silhouette with calcified aorta. Left upper chest defibrillator with leads overlying the right side of the heart. Vascular congestion. No pneumothorax or effusion. Question trace edema. No separate consolidation IMPRESSION: Vascular congestion with question trace edema. Defibrillator. Decreased inflation compared to prior x-ray.  Recommend follow-up Electronically Signed   By: Karen Kays M.D.   On: 11/04/2022 10:38   DG Pelvis  Portable  Result Date: 10/31/2022 CLINICAL DATA:  Bipolar right hip hemiarthroplasty. EXAM: PORTABLE PELVIS 1-2 VIEWS COMPARISON:  CT right hip 11/02/2022 FINDINGS: Single frontal view of the bilateral hips. Interval bipolar right hip hemiarthroplasty with normal alignment. Expected postoperative subcutaneous air about the right hip. Mild superomedial left femoroacetabular joint space narrowing. Surgical clips overlie the pelvis. No acute fracture or dislocation. IMPRESSION: Interval bipolar right hip hemiarthroplasty with normal alignment. Electronically Signed   By: Neita Garnet M.D.   On: 10/31/2022 16:23   CT Hip Right Wo Contrast  Result Date: 10/30/2022 CLINICAL DATA:  Right femoral fracture EXAM: CT OF THE RIGHT HIP WITHOUT CONTRAST TECHNIQUE: Multidetector CT imaging of the right hip was performed according to the standard protocol. Multiplanar CT image reconstructions were also generated. RADIATION DOSE REDUCTION: This exam was performed according to the departmental dose-optimization program which includes automated exposure control, adjustment of the mA and/or kV according to patient size and/or use of iterative reconstruction technique. COMPARISON:  Right hips and pelvis radiograph dated 10/30/2022 FINDINGS: Bones/Joint/Cartilage Acute transcervical fracture of the right proximal femur with apex anterior angulation. The right femoroacetabular joint is intact. Ligaments Suboptimally assessed by CT. Muscles and Tendons Grossly intact. Soft tissues Vascular calcifications of the partially imaged right lower extremity arteries. IMPRESSION: Mildly angulated right femoral transcervical fracture. Electronically Signed   By: Agustin Cree M.D.   On: 10/30/2022 20:49   DG Knee 2 Views Right  Result Date: 10/30/2022 CLINICAL DATA:  Right knee pain, fall EXAM: RIGHT KNEE - 1-2 VIEW COMPARISON:  None Available. FINDINGS: Moderate tricompartment degenerative changes with joint space narrowing and spurring. No  joint effusion. No acute bony abnormality. Specifically, no fracture, subluxation, or dislocation. IMPRESSION: Moderate degenerative changes.  No acute bony abnormality. Electronically Signed   By: Charlett Nose M.D.   On: 10/30/2022 20:25   DG Hip Unilat W or Wo Pelvis 2-3 Views Right  Result Date: 10/30/2022 CLINICAL DATA:  Fall, right hip pain EXAM: DG HIP (WITH OR WITHOUT PELVIS) 2-3V RIGHT COMPARISON:  None Available. FINDINGS: There is foreshortening of the right femoral neck with cortical irregularity noted laterally concerning for right femoral neck fracture. No subluxation or dislocation. Mild symmetric degenerative changes in the hips with joint space narrowing and spurring. IMPRESSION: Foreshortening of the right femoral neck with cortical irregularity laterally concerning for femoral neck fracture. Electronically Signed   By: Charlett Nose M.D.   On: 10/30/2022 20:24   DG Chest Portable 1 View  Result Date: 10/30/2022 CLINICAL DATA:  Fall, right hip pain EXAM: PORTABLE  CHEST 1 VIEW COMPARISON:  09/14/2022 FINDINGS: Left AICD remains in place, unchanged. Heart is normal size. Mediastinal contours within normal limits. No confluent opacities or effusions. No acute bony abnormality. IMPRESSION: No active disease. Electronically Signed   By: Charlett Nose M.D.   On: 10/30/2022 20:22   CT HEAD WO CONTRAST ( )  Result Date: 10/30/2022 CLINICAL DATA:  Trip and fall injury. EXAM: CT HEAD WITHOUT CONTRAST CT CERVICAL SPINE WITHOUT CONTRAST TECHNIQUE: Multidetector CT imaging of the head and cervical spine was performed following the standard protocol without intravenous contrast. Multiplanar CT image reconstructions of the cervical spine were also generated. RADIATION DOSE REDUCTION: This exam was performed according to the departmental dose-optimization program which includes automated exposure control, adjustment of the mA and/or kV according to patient size and/or use of iterative reconstruction  technique. COMPARISON:  Head CT without contrast 04/17/2022, cervical spine CT 03/27/2022. FINDINGS: CT HEAD FINDINGS Brain: There is mild atrophy, small-vessel disease and atrophic ventriculomegaly. No new asymmetry is seen concerning for an acute infarct, hemorrhage or mass. There is no midline shift. The basal cisterns are clear. Vascular: There are patchy calcifications of the carotid siphons but no hyperdense central vessels. Skull: No fracture or focal lesions.  Mild calvarial osteopenia. Sinuses/Orbits: No acute findings. Clear visualized sinuses and mastoid air cells. Negative orbits apart from old lens replacements. Other: None. CT CERVICAL SPINE FINDINGS Alignment: Unchanged. There is a trace 2 mm C3-4 retrolisthesis, similar minimal C4-5 anterolisthesis and trace C5-6 and C6-7 retrolisthesis all believed degenerative and all unchanged. No traumatic listhesis is seen. There is bone-on-bone anterior atlantodental joint space loss with osteophytes, and degenerative cystic changes in the odontoid process. Skull base and vertebrae: No acute fracture is evident. Bone mineralization is osteopenic. No primary bone lesion or focal pathologic process is seen. Soft tissues and spinal canal: No prevertebral fluid or swelling. No visible canal hematoma. There are calcifications in both proximal cervical ICAs, heaviest on the left where there is probably a flow-limiting origin stenosis. No laryngeal or thyroid mass. Disc levels: There is moderate disc space loss again at C3-4, C5-6 and C6-7, mild disc narrowing at C3-4, normal disc heights at C2-3 and C7-T1. There are small bidirectional endplate spurs from C3-4 through C6-7, causing partial effacement of the ventral CSF without spondylotic cord compression. Facet joint spurring on the left-greater-than-right is seen at most levels with bilateral uncinate spurring. Acquired foraminal stenosis is mild on the right at C2-3, severe on the left and moderate on the right at  C3-4, bilaterally mild-to-moderate C6-7, and not seen at the remaining levels. Upper chest: Pacemaker wiring is partially visible entering from the left. Otherwise negative. Other: None. IMPRESSION: 1. No acute intracranial CT findings or depressed skull fractures. 2. Osteopenia and degenerative change without evidence of cervical fractures or traumatic listhesis. 3. 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 Follow-up as indicated. Electronically Signed   By: Almira Bar M.D.   On: 10/30/2022 20:15   CT Cervical Spine Wo Contrast  Result Date: 10/30/2022 CLINICAL DATA:  Trip and fall injury. EXAM: CT HEAD WITHOUT CONTRAST CT CERVICAL SPINE WITHOUT CONTRAST TECHNIQUE: Multidetector CT imaging of the head and cervical spine was performed following the standard protocol without intravenous contrast. Multiplanar CT image reconstructions of the cervical spine were also generated. RADIATION DOSE REDUCTION: This exam was performed according to the departmental dose-optimization program which includes automated exposure control, adjustment of the mA and/or kV according to patient size and/or use  of iterative reconstruction technique. COMPARISON:  Head CT without contrast 04/17/2022, cervical spine CT 03/27/2022. FINDINGS: CT HEAD FINDINGS Brain: There is mild atrophy, small-vessel disease and atrophic ventriculomegaly. No new asymmetry is seen concerning for an acute infarct, hemorrhage or mass. There is no midline shift. The basal cisterns are clear. Vascular: There are patchy calcifications of the carotid siphons but no hyperdense central vessels. Skull: No fracture or focal lesions.  Mild calvarial osteopenia. Sinuses/Orbits: No acute findings. Clear visualized sinuses and mastoid air cells. Negative orbits apart from old lens replacements. Other: None. CT CERVICAL SPINE FINDINGS Alignment: Unchanged. There is a trace 2 mm C3-4 retrolisthesis, similar minimal  C4-5 anterolisthesis and trace C5-6 and C6-7 retrolisthesis all believed degenerative and all unchanged. No traumatic listhesis is seen. There is bone-on-bone anterior atlantodental joint space loss with osteophytes, and degenerative cystic changes in the odontoid process. Skull base and vertebrae: No acute fracture is evident. Bone mineralization is osteopenic. No primary bone lesion or focal pathologic process is seen. Soft tissues and spinal canal: No prevertebral fluid or swelling. No visible canal hematoma. There are calcifications in both proximal cervical ICAs, heaviest on the left where there is probably a flow-limiting origin stenosis. No laryngeal or thyroid mass. Disc levels: There is moderate disc space loss again at C3-4, C5-6 and C6-7, mild disc narrowing at C3-4, normal disc heights at C2-3 and C7-T1. There are small bidirectional endplate spurs from C3-4 through C6-7, causing partial effacement of the ventral CSF without spondylotic cord compression. Facet joint spurring on the left-greater-than-right is seen at most levels with bilateral uncinate spurring. Acquired foraminal stenosis is mild on the right at C2-3, severe on the left and moderate on the right at C3-4, bilaterally mild-to-moderate C6-7, and not seen at the remaining levels. Upper chest: Pacemaker wiring is partially visible entering from the left. Otherwise negative. Other: None. IMPRESSION: 1. No acute intracranial CT findings or depressed skull fractures. 2. Osteopenia and degenerative change without evidence of cervical fractures or traumatic listhesis. 3. 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 Follow-up as indicated. Electronically Signed   By: Almira Bar M.D.   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 During that admission the patient's defibrillator shocked him for VF, subsequently loaded with amiodarone. He was discharged to a SNF on 5/23. He presented back to [facility] ED on 5/26 with shortness of breath. Cardiology consulted for assistance with his CHF.   # acute on chronic HFrEF  Not hypervolemic on exam, no crackles today. On baseline O2. Diuresed well with IV lasix. BP low normal. - continue home midodrine 5mg  TID - continue metoprolol tartrate  - continue PO lasix but decrease dose from 40mg  to 20mg  daily today, recommend continuing this on discharge  - continued GDMT additions of  ARB/ARNI & SGLT2i limited with BP and CKD. Intolerant of spiro.   # COPD with exacerbation  # chronic respiratory failure (2L)  - agree with current therapy per primary team  - on steroids, duonebs  # paroxysmal AF RVR  # hx VT  Rate better controlled today. No evidence of recurrent VT.  -decrease metoprolol tartrate from 37.5 mg to 25mg  twice daily with borderline low BP -Continue to hold amiodarone with transaminitis. Recommend close follow-up with [facility] electrophysiology at discharge.  # CKD 3  Slight bump today, BUN/Cr 35/1.45 and GFR 49.   # demand ischemia  Borderline elevated and flat trending 21, 23, which is most consistent with demand/supply mismatch and not ACS   Ok for discharge today from a cardiac perspective. Recommend close follow up with EP at [facility] and will arrange for follow up in clinic with  Dr. Darrold Junker in 1-2 weeks.    This patient's plan of care was discussed and created with Dr. Darrold Junker and he is in agreement.  Signed: Rebeca Allegra , PA-C 11/18/2022, 12:29 PM [facility]

## 2022-11-18 NOTE — Progress Notes (Addendum)
Attempted to call report to [facility] at [phone].  Was trasnferred a few times and ultimately had to leave a VM for a call back.  I'll attempt to call report again prior to pt transfer.    Pt's IV's and tele monitor removed.  Pt in agreement with POC and transfer

## 2022-11-20 LAB — VITAMIN D 25 HYDROXY (VIT D DEFICIENCY, FRACTURES)

## 2022-11-28 ENCOUNTER — Institutional Professional Consult (permissible substitution): Payer: Medicare Other | Admitting: Student in an Organized Health Care Education/Training Program

## 2022-12-06 ENCOUNTER — Emergency Department
Admission: EM | Admit: 2022-12-06 | Discharge: 2022-12-06 | Discharge status: Against Medical Advice | Payer: Medicare Other | Attending: Emergency Medicine | Admitting: Emergency Medicine

## 2022-12-06 DIAGNOSIS — I10 Essential (primary) hypertension: Secondary | ICD-10-CM | POA: Diagnosis not present

## 2022-12-06 DIAGNOSIS — Z5321 Procedure and treatment not carried out due to patient leaving prior to being seen by health care provider: Secondary | ICD-10-CM | POA: Insufficient documentation

## 2022-12-06 NOTE — ED Notes (Signed)
Patient placed on 3L O2 via [place]. Patient is on 2L O2 at home and has been off it since arrival.

## 2022-12-06 NOTE — ED Notes (Signed)
RN assumed care as first nurse. In reviewing chart RN decided to begin placing orders to fully assess pt while waiting for provider evaluation. While doing so, pt and family approached RN and requested IV be removed and for pt to go home. Pt states does not wish to be evaluated. Pt denies h/a, dizziness, vision change, chest pain, or any other symptoms. Pt verbalized understanding that he had not yet been fully checked out and declined RN offer to complete orders to do say. Pt states he does not wish to be seen and that he will return should he feel differently. Pt witnessed ambulating from department with family with independent and steady gait. IV removed by RN.

## 2022-12-06 NOTE — ED Triage Notes (Signed)
Pt sts that he is here from [facility] due to his BP being high. Pt is there for rehab due to a broken hip.

## 2022-12-17 ENCOUNTER — Emergency Department: Payer: Medicare Other

## 2022-12-17 ENCOUNTER — Inpatient Hospital Stay
Admission: EM | Admit: 2022-12-17 | Discharge: 2022-12-20 | DRG: 291 | Disposition: A | Discharge status: Home Health | Payer: Medicare Other | Attending: Internal Medicine | Admitting: Internal Medicine

## 2022-12-17 ENCOUNTER — Other Ambulatory Visit: Payer: Self-pay

## 2022-12-17 DIAGNOSIS — I5023 Acute on chronic systolic (congestive) heart failure: Secondary | ICD-10-CM | POA: Diagnosis present

## 2022-12-17 DIAGNOSIS — Z87891 Personal history of nicotine dependence: Secondary | ICD-10-CM | POA: Diagnosis not present

## 2022-12-17 DIAGNOSIS — I10 Essential (primary) hypertension: Secondary | ICD-10-CM | POA: Diagnosis present

## 2022-12-17 DIAGNOSIS — Z79899 Other long term (current) drug therapy: Secondary | ICD-10-CM

## 2022-12-17 DIAGNOSIS — L89322 Pressure ulcer of left buttock, stage 2: Secondary | ICD-10-CM | POA: Diagnosis present

## 2022-12-17 DIAGNOSIS — I255 Ischemic cardiomyopathy: Secondary | ICD-10-CM | POA: Diagnosis present

## 2022-12-17 DIAGNOSIS — E785 Hyperlipidemia, unspecified: Secondary | ICD-10-CM | POA: Diagnosis present

## 2022-12-17 DIAGNOSIS — D649 Anemia, unspecified: Secondary | ICD-10-CM | POA: Diagnosis present

## 2022-12-17 DIAGNOSIS — Z9849 Cataract extraction status, unspecified eye: Secondary | ICD-10-CM

## 2022-12-17 DIAGNOSIS — E440 Moderate protein-calorie malnutrition: Secondary | ICD-10-CM | POA: Diagnosis present

## 2022-12-17 DIAGNOSIS — I480 Paroxysmal atrial fibrillation: Secondary | ICD-10-CM | POA: Diagnosis present

## 2022-12-17 DIAGNOSIS — F32A Depression, unspecified: Secondary | ICD-10-CM | POA: Diagnosis present

## 2022-12-17 DIAGNOSIS — J9621 Acute and chronic respiratory failure with hypoxia: Secondary | ICD-10-CM | POA: Diagnosis present

## 2022-12-17 DIAGNOSIS — Z7901 Long term (current) use of anticoagulants: Secondary | ICD-10-CM | POA: Diagnosis not present

## 2022-12-17 DIAGNOSIS — N1832 Chronic kidney disease, stage 3b: Secondary | ICD-10-CM | POA: Diagnosis present

## 2022-12-17 DIAGNOSIS — I2510 Atherosclerotic heart disease of native coronary artery without angina pectoris: Secondary | ICD-10-CM | POA: Diagnosis present

## 2022-12-17 DIAGNOSIS — I4891 Unspecified atrial fibrillation: Secondary | ICD-10-CM | POA: Diagnosis present

## 2022-12-17 DIAGNOSIS — N183 Chronic kidney disease, stage 3 unspecified: Secondary | ICD-10-CM | POA: Diagnosis present

## 2022-12-17 DIAGNOSIS — I252 Old myocardial infarction: Secondary | ICD-10-CM

## 2022-12-17 DIAGNOSIS — Z96641 Presence of right artificial hip joint: Secondary | ICD-10-CM | POA: Diagnosis present

## 2022-12-17 DIAGNOSIS — N400 Enlarged prostate without lower urinary tract symptoms: Secondary | ICD-10-CM | POA: Diagnosis present

## 2022-12-17 DIAGNOSIS — F419 Anxiety disorder, unspecified: Secondary | ICD-10-CM | POA: Diagnosis present

## 2022-12-17 DIAGNOSIS — Z95810 Presence of automatic (implantable) cardiac defibrillator: Secondary | ICD-10-CM | POA: Diagnosis not present

## 2022-12-17 DIAGNOSIS — Z9981 Dependence on supplemental oxygen: Secondary | ICD-10-CM

## 2022-12-17 DIAGNOSIS — I130 Hypertensive heart and chronic kidney disease with heart failure and stage 1 through stage 4 chronic kidney disease, or unspecified chronic kidney disease: Principal | ICD-10-CM | POA: Diagnosis present

## 2022-12-17 DIAGNOSIS — J441 Chronic obstructive pulmonary disease with (acute) exacerbation: Secondary | ICD-10-CM | POA: Diagnosis present

## 2022-12-17 DIAGNOSIS — Z8249 Family history of ischemic heart disease and other diseases of the circulatory system: Secondary | ICD-10-CM | POA: Diagnosis not present

## 2022-12-17 DIAGNOSIS — I714 Abdominal aortic aneurysm, without rupture, unspecified: Secondary | ICD-10-CM | POA: Diagnosis present

## 2022-12-17 DIAGNOSIS — R0602 Shortness of breath: Secondary | ICD-10-CM | POA: Diagnosis not present

## 2022-12-17 DIAGNOSIS — Z6822 Body mass index (BMI) 22.0-22.9, adult: Secondary | ICD-10-CM

## 2022-12-17 DIAGNOSIS — Z7951 Long term (current) use of inhaled steroids: Secondary | ICD-10-CM

## 2022-12-17 DIAGNOSIS — L89312 Pressure ulcer of right buttock, stage 2: Secondary | ICD-10-CM | POA: Diagnosis present

## 2022-12-17 DIAGNOSIS — Z8546 Personal history of malignant neoplasm of prostate: Secondary | ICD-10-CM

## 2022-12-17 DIAGNOSIS — Z1152 Encounter for screening for COVID-19: Secondary | ICD-10-CM

## 2022-12-17 DIAGNOSIS — Z888 Allergy status to other drugs, medicaments and biological substances status: Secondary | ICD-10-CM

## 2022-12-17 DIAGNOSIS — T502X5A Adverse effect of carbonic-anhydrase inhibitors, benzothiadiazides and other diuretics, initial encounter: Secondary | ICD-10-CM | POA: Diagnosis present

## 2022-12-17 LAB — CBC
HCT: 32.1 % — ABNORMAL LOW (ref 39.0–52.0)
Hemoglobin: 9.8 g/dL — ABNORMAL LOW (ref 13.0–17.0)
MCH: 29.5 pg (ref 26.0–34.0)
MCHC: 30.5 g/dL (ref 30.0–36.0)
MCV: 96.7 fL (ref 80.0–100.0)
Platelets: 196 10*3/uL (ref 150–400)
RBC: 3.32 MIL/uL — ABNORMAL LOW (ref 4.22–5.81)
RDW: 14.4 % (ref 11.5–15.5)
WBC: 9.6 10*3/uL (ref 4.0–10.5)
nRBC: 0 % (ref 0.0–0.2)

## 2022-12-17 LAB — RESPIRATORY PANEL BY PCR

## 2022-12-17 LAB — TROPONIN I (HIGH SENSITIVITY)
Troponin I (High Sensitivity): 13 ng/L (ref <18)
Troponin I (High Sensitivity): 16 ng/L (ref <18)

## 2022-12-17 LAB — COMPREHENSIVE METABOLIC PANEL
ALT: 12 U/L (ref 0–44)
AST: 22 U/L (ref 15–41)
Albumin: 3.5 g/dL (ref 3.5–5.0)
Alkaline Phosphatase: 89 U/L (ref 38–126)
Anion gap: 9 (ref 5–15)
BUN: 20 mg/dL (ref 8–23)
CO2: 23 mmol/L (ref 22–32)
Calcium: 8.7 mg/dL — ABNORMAL LOW (ref 8.9–10.3)
Chloride: 105 mmol/L (ref 98–111)
Creatinine, Ser: 1.39 mg/dL — ABNORMAL HIGH (ref 0.61–1.24)
GFR, Estimated: 51 mL/min — ABNORMAL LOW (ref >60)
Glucose, Bld: 113 mg/dL — ABNORMAL HIGH (ref 70–99)
Potassium: 4.3 mmol/L (ref 3.5–5.1)
Sodium: 137 mmol/L (ref 135–145)
Total Bilirubin: 0.8 mg/dL (ref 0.3–1.2)
Total Protein: 6.2 g/dL — ABNORMAL LOW (ref 6.5–8.1)

## 2022-12-17 LAB — SARS CORONAVIRUS 2 BY RT PCR: SARS Coronavirus 2 by RT PCR: NEGATIVE

## 2022-12-17 LAB — BRAIN NATRIURETIC PEPTIDE: B Natriuretic Peptide: 717.5 pg/mL — ABNORMAL HIGH (ref 0.0–100.0)

## 2022-12-17 MED ORDER — MAGNESIUM OXIDE -MG SUPPLEMENT 400 (240 MG) MG PO TABS
400.0000 mg | ORAL_TABLET | Freq: Every day | ORAL | Status: DC
  Administered 2022-12-17 – 2022-12-20 (×4): 400 mg via ORAL
  Filled 2022-12-17 (×4): qty 1

## 2022-12-17 MED ORDER — DILTIAZEM HCL 25 MG/5ML IV SOLN
10.0000 mg | Freq: Once | INTRAVENOUS | Status: AC
  Administered 2022-12-17: 10 mg via INTRAVENOUS
  Filled 2022-12-17: qty 5

## 2022-12-17 MED ORDER — MIDODRINE HCL 5 MG PO TABS
5.0000 mg | ORAL_TABLET | Freq: Three times a day (TID) | ORAL | Status: DC
  Administered 2022-12-17 – 2022-12-20 (×9): 5 mg via ORAL
  Filled 2022-12-17 (×9): qty 1

## 2022-12-17 MED ORDER — POLYETHYLENE GLYCOL 3350 17 G PO PACK: 17.0000 g | PACK | Freq: Every day | ORAL | Status: DC | PRN

## 2022-12-17 MED ORDER — ADULT MULTIVITAMIN W/MINERALS CH
1.0000 | ORAL_TABLET | Freq: Every day | ORAL | Status: DC
  Administered 2022-12-17 – 2022-12-20 (×4): 1 via ORAL
  Filled 2022-12-17 (×4): qty 1

## 2022-12-17 MED ORDER — MOMETASONE FURO-FORMOTEROL FUM 200-5 MCG/ACT IN AERO
2.0000 | INHALATION_SPRAY | Freq: Two times a day (BID) | RESPIRATORY_TRACT | Status: DC
  Administered 2022-12-18 – 2022-12-20 (×5): 2 via RESPIRATORY_TRACT
  Filled 2022-12-17 (×2): qty 8.8

## 2022-12-17 MED ORDER — SERTRALINE HCL 50 MG PO TABS
25.0000 mg | ORAL_TABLET | Freq: Every day | ORAL | Status: DC
  Administered 2022-12-17 – 2022-12-20 (×4): 25 mg via ORAL
  Filled 2022-12-17 (×4): qty 1

## 2022-12-17 MED ORDER — METOPROLOL TARTRATE 25 MG PO TABS
25.0000 mg | ORAL_TABLET | Freq: Once | ORAL | Status: AC
  Administered 2022-12-17: 25 mg via ORAL
  Filled 2022-12-17: qty 1

## 2022-12-17 MED ORDER — VITAMIN C 500 MG PO TABS
500.0000 mg | ORAL_TABLET | Freq: Every day | ORAL | Status: DC
  Administered 2022-12-17 – 2022-12-20 (×4): 500 mg via ORAL
  Filled 2022-12-17 (×4): qty 1

## 2022-12-17 MED ORDER — PROSOURCE PLUS PO LIQD
30.0000 mL | Freq: Three times a day (TID) | ORAL | Status: DC
  Administered 2022-12-17 – 2022-12-19 (×7): 30 mL via ORAL
  Filled 2022-12-17 (×12): qty 30

## 2022-12-17 MED ORDER — DM-GUAIFENESIN ER 30-600 MG PO TB12: 1.0000 | ORAL_TABLET | Freq: Two times a day (BID) | ORAL | Status: DC | PRN

## 2022-12-17 MED ORDER — SUCRALFATE 1 GM/10ML PO SUSP
1.0000 g | Freq: Three times a day (TID) | ORAL | Status: DC
  Administered 2022-12-17 – 2022-12-20 (×12): 1 g via ORAL
  Filled 2022-12-17 (×14): qty 10

## 2022-12-17 MED ORDER — LORAZEPAM 0.5 MG PO TABS
0.5000 mg | ORAL_TABLET | Freq: Three times a day (TID) | ORAL | Status: DC | PRN
  Administered 2022-12-18 – 2022-12-20 (×3): 0.5 mg via ORAL
  Filled 2022-12-17 (×3): qty 1

## 2022-12-17 MED ORDER — ACETAMINOPHEN 325 MG PO TABS
650.0000 mg | ORAL_TABLET | Freq: Four times a day (QID) | ORAL | Status: DC | PRN
  Administered 2022-12-17 – 2022-12-20 (×4): 650 mg via ORAL
  Filled 2022-12-17 (×4): qty 2

## 2022-12-17 MED ORDER — MOMETASONE FURO-FORMOTEROL FUM 200-5 MCG/ACT IN AERO
2.0000 | INHALATION_SPRAY | Freq: Two times a day (BID) | RESPIRATORY_TRACT | Status: DC
  Filled 2022-12-17: qty 8.8

## 2022-12-17 MED ORDER — HYDROCODONE-ACETAMINOPHEN 5-325 MG PO TABS: 1.0000 | ORAL_TABLET | Freq: Four times a day (QID) | ORAL | Status: DC | PRN

## 2022-12-17 MED ORDER — DIGOXIN 0.25 MG/ML IJ SOLN
0.2500 mg | Freq: Once | INTRAMUSCULAR | Status: AC
  Administered 2022-12-18: 0.25 mg via INTRAVENOUS
  Filled 2022-12-17: qty 2

## 2022-12-17 MED ORDER — PYRIDOSTIGMINE BROMIDE 60 MG PO TABS
60.0000 mg | ORAL_TABLET | Freq: Three times a day (TID) | ORAL | Status: DC
  Administered 2022-12-17 – 2022-12-20 (×9): 60 mg via ORAL
  Filled 2022-12-17 (×9): qty 1

## 2022-12-17 MED ORDER — METOPROLOL TARTRATE 25 MG PO TABS
25.0000 mg | ORAL_TABLET | Freq: Two times a day (BID) | ORAL | Status: DC
  Administered 2022-12-17 – 2022-12-20 (×6): 25 mg via ORAL
  Filled 2022-12-17 (×6): qty 1

## 2022-12-17 MED ORDER — TAMSULOSIN HCL 0.4 MG PO CAPS
0.4000 mg | ORAL_CAPSULE | Freq: Every day | ORAL | Status: DC
  Administered 2022-12-17 – 2022-12-20 (×4): 0.4 mg via ORAL
  Filled 2022-12-17 (×4): qty 1

## 2022-12-17 MED ORDER — CALCIUM CARBONATE ANTACID 500 MG PO CHEW
1.0000 | CHEWABLE_TABLET | Freq: Every day | ORAL | Status: DC
  Administered 2022-12-17 – 2022-12-20 (×4): 200 mg via ORAL
  Filled 2022-12-17 (×4): qty 1

## 2022-12-17 MED ORDER — MELATONIN 5 MG PO TABS
10.0000 mg | ORAL_TABLET | Freq: Every day | ORAL | Status: DC
  Administered 2022-12-17 – 2022-12-19 (×3): 10 mg via ORAL
  Filled 2022-12-17 (×3): qty 2

## 2022-12-17 MED ORDER — PANTOPRAZOLE SODIUM 40 MG PO TBEC
40.0000 mg | DELAYED_RELEASE_TABLET | Freq: Two times a day (BID) | ORAL | Status: DC
  Administered 2022-12-17 – 2022-12-20 (×7): 40 mg via ORAL
  Filled 2022-12-17 (×7): qty 1

## 2022-12-17 MED ORDER — LEVALBUTEROL HCL 1.25 MG/0.5ML IN NEBU
1.2500 mg | INHALATION_SOLUTION | Freq: Four times a day (QID) | RESPIRATORY_TRACT | Status: DC
  Administered 2022-12-18 (×2): 1.25 mg via RESPIRATORY_TRACT
  Filled 2022-12-17 (×2): qty 0.5

## 2022-12-17 MED ORDER — HYDRALAZINE HCL 20 MG/ML IJ SOLN: 5.0000 mg | INTRAMUSCULAR | Status: DC | PRN

## 2022-12-17 MED ORDER — MIDODRINE HCL 5 MG PO TABS: 30.0000 mg | ORAL_TABLET | Freq: Three times a day (TID) | ORAL | Status: DC

## 2022-12-17 MED ORDER — IPRATROPIUM BROMIDE 0.02 % IN SOLN
0.5000 mg | Freq: Four times a day (QID) | RESPIRATORY_TRACT | Status: DC
  Administered 2022-12-18 (×2): 0.5 mg via RESPIRATORY_TRACT
  Filled 2022-12-17 (×2): qty 2.5

## 2022-12-17 MED ORDER — FUROSEMIDE 10 MG/ML IJ SOLN
40.0000 mg | Freq: Two times a day (BID) | INTRAMUSCULAR | Status: DC
  Administered 2022-12-17 (×2): 40 mg via INTRAVENOUS
  Filled 2022-12-17 (×2): qty 4

## 2022-12-17 MED ORDER — LORAZEPAM 0.5 MG PO TABS: 0.5000 mg | ORAL_TABLET | Freq: Three times a day (TID) | ORAL | Status: DC | PRN

## 2022-12-17 MED ORDER — IPRATROPIUM-ALBUTEROL 0.5-2.5 (3) MG/3ML IN SOLN
3.0000 mL | RESPIRATORY_TRACT | Status: DC
  Administered 2022-12-17 (×4): 3 mL via RESPIRATORY_TRACT
  Filled 2022-12-17 (×4): qty 3

## 2022-12-17 MED ORDER — LORAZEPAM 2 MG/ML IJ SOLN
0.5000 mg | Freq: Once | INTRAMUSCULAR | Status: AC
  Administered 2022-12-17: 0.5 mg via INTRAVENOUS
  Filled 2022-12-17: qty 1

## 2022-12-17 MED ORDER — APIXABAN 2.5 MG PO TABS
2.5000 mg | ORAL_TABLET | Freq: Two times a day (BID) | ORAL | Status: DC
  Administered 2022-12-17 – 2022-12-20 (×7): 2.5 mg via ORAL
  Filled 2022-12-17 (×8): qty 1

## 2022-12-17 MED ORDER — GABAPENTIN 100 MG PO CAPS
100.0000 mg | ORAL_CAPSULE | Freq: Every day | ORAL | Status: DC
  Administered 2022-12-17 – 2022-12-19 (×3): 100 mg via ORAL
  Filled 2022-12-17 (×3): qty 1

## 2022-12-17 MED ORDER — ONDANSETRON HCL 4 MG/2ML IJ SOLN
4.0000 mg | Freq: Three times a day (TID) | INTRAMUSCULAR | Status: DC | PRN
  Administered 2022-12-17 – 2022-12-18 (×2): 4 mg via INTRAVENOUS
  Filled 2022-12-17 (×2): qty 2

## 2022-12-17 MED ORDER — DILTIAZEM HCL 25 MG/5ML IV SOLN
2.5000 mg | Freq: Once | INTRAVENOUS | Status: AC
  Administered 2022-12-18: 2.5 mg via INTRAVENOUS
  Filled 2022-12-17: qty 5

## 2022-12-17 MED ORDER — ALBUTEROL SULFATE (2.5 MG/3ML) 0.083% IN NEBU: 2.5000 mg | INHALATION_SOLUTION | RESPIRATORY_TRACT | Status: DC | PRN

## 2022-12-17 MED ORDER — METOPROLOL TARTRATE 5 MG/5ML IV SOLN
2.5000 mg | INTRAVENOUS | Status: DC | PRN
  Administered 2022-12-18: 2.5 mg via INTRAVENOUS

## 2022-12-17 MED ORDER — ATORVASTATIN CALCIUM 80 MG PO TABS
80.0000 mg | ORAL_TABLET | Freq: Every day | ORAL | Status: DC
  Administered 2022-12-17 – 2022-12-20 (×4): 80 mg via ORAL
  Filled 2022-12-17 (×3): qty 1
  Filled 2022-12-17: qty 4

## 2022-12-17 MED ORDER — IPRATROPIUM-ALBUTEROL 0.5-2.5 (3) MG/3ML IN SOLN
3.0000 mL | Freq: Once | RESPIRATORY_TRACT | Status: AC
  Administered 2022-12-17: 3 mL via RESPIRATORY_TRACT
  Filled 2022-12-17: qty 3

## 2022-12-17 NOTE — ED Notes (Signed)
Transport ticket placed at this time.

## 2022-12-17 NOTE — ED Triage Notes (Signed)
Pt BIB [facility] from home with c/o difficulty breathing that started 2 hrs ago. Poor historian, Patient prescribed inhaler but does not know where it is. As needed 02 3L was placed by patient with little relief. Denies chest pain

## 2022-12-17 NOTE — ED Provider Notes (Signed)
[facility] Provider Note    Event Date/Time   First MD Initiated Contact with Patient 12/17/22 0142     (approximate)  History   Chief Complaint: Respiratory Distress  HPI  Matthew Zamora is a 82 y.o. male with a past medical history of CHF, COPD on oxygen at home, hypertension, hyperlipidemia, paroxysmal atrial fibrillation, presents to the emergency department for shortness of breath.  Cording to the patient for the past 3 to 4 hours he developed shortness of breath at home.  States he normally wears 2 L of oxygen but increased it to 3 L and did feel somewhat better.  EMS states upon arrival they noted expiratory wheeze dose DuoNebs and Solu-Medrol brought to the emergency department.  Patient currently satting 93% on room air in the emergency department and states he is feeling much better.  He is tachycardic around 120 bpm but did just finish a DuoNeb treatment.  Denies any shortness of breath currently.  Physical Exam   Triage Vital Signs: ED Triage Vitals  Enc Vitals Group     BP 12/17/22 0153 122/78     Pulse Rate 12/17/22 0153 (!) 120     Resp 12/17/22 0153 20     Temp 12/17/22 0153 98.2 F (36.8 C)     Temp Source 12/17/22 0153 Oral     SpO2 12/17/22 0152 93 %     Weight --      Height --      Head Circumference --      Peak Flow --      Pain Score 12/17/22 0153 0     Pain Loc --      Pain Edu? --      Excl. in GC? --     Most recent vital signs: Vitals:   12/17/22 0152 12/17/22 0153  BP:  122/78  Pulse:  (!) 120  Resp:  20  Temp:  98.2 F (36.8 C)  SpO2: 93% 93%    General: Awake, no distress.  CV:  Good peripheral perfusion.  Regular rate and rhythm  Resp:  Normal effort.  Equal breath sounds bilaterally.  Overall clear lung sounds without any obvious wheeze rales or rhonchi. Abd:  No distention.  Soft, nontender.  No rebound or guarding. Other:  No lower extremity edema   ED Results / Procedures / Treatments    EKG  EKG viewed and interpreted by myself shows sinus tachycardia at 121 bpm with narrow QRS, right axis deviation, largely normal intervals with nonspecific ST changes without ST elevation  RADIOLOGY  I have reviewed and interpreted chest x-ray images.  Pacemaker present but no obvious infiltrate. Radiology has read the x-ray as mild vascular congestion.  No infiltrate.   MEDICATIONS ORDERED IN ED: Medications - No data to display   IMPRESSION / MDM / ASSESSMENT AND PLAN / ED COURSE  I reviewed the triage vital signs and the nursing notes.  Patient's presentation is most consistent with acute presentation with potential threat to life or bodily function.  Patient presents emergency department for shortness of breath.  Per EMS had wheeze on arrival given DuoNebs and Solu-Medrol and route to the hospital.  Here the patient appears well he is mildly tachycardic around 120 bpm what appears to be sinus tachycardia on EKG, however the patient did just finish a breathing treatment.  Given the patient's complaint of shortness of breath at home we will check labs including CBC chemistry troponin and BNP will obtain  a chest x-ray and continue to closely monitor.  Will place the patient on 2 L nasal cannula which he states is his baseline although satting well on room air currently.  Patient's labs have resulted showing negative COVID test negative troponin reassuring CBC with a normal white blood cell count, reassuring chemistry.  BNP is slightly elevated 717.  Patient's symptoms suggestive more of COPD.  Patient has developed a wheeze and shortness of breath once again in the emergency department.  Patient states BiPAP is worked well in the past.  Will place patient on a BiPAP mask.  Will admit to the hospital service for further workup and treatment for likely COPD exacerbation.  CRITICAL CARE Performed by: Minna Antis   Total critical care time: 30 minutes  Critical care time was  exclusive of separately billable procedures and treating other patients.  Critical care was necessary to treat or prevent imminent or life-threatening deterioration.  Critical care was time spent personally by me on the following activities: development of treatment plan with patient and/or surrogate as well as nursing, discussions with consultants, evaluation of patient's response to treatment, examination of patient, obtaining history from patient or surrogate, ordering and performing treatments and interventions, ordering and review of laboratory studies, ordering and review of radiographic studies, pulse oximetry and re-evaluation of patient's condition.   FINAL CLINICAL IMPRESSION(S) / ED DIAGNOSES   COPD exacerbation Dyspnea  Note:  This document was prepared using Dragon voice recognition software and may include unintentional dictation errors.   Minna Antis, MD 12/18/22 [phone]

## 2022-12-17 NOTE — ED Notes (Signed)
The pt was removed from his bipap this morning per pt request. The pt advised the bipap was making him nauseated. Dr. Clyde Lundborg was advised of pt's request. Dr. Clyde Lundborg advised he was okay to be taken off of the bipap. The pt has requested to put on 2 liters of O2 but the pt's pulse ox is 97 to 100 percent on room air. The pt is repeatedly taking off the nasal cannula and leaving it off for long periods of time.

## 2022-12-17 NOTE — H&P (Signed)
History and Physical    Matthew Zamora ZOX:[id number] DOB: [birth year] DOA: 12/17/2022  Referring MD/NP/PA:   PCP: Jerl Mina, MD   Patient coming from:  The patient is coming from home.     Chief Complaint: Shortness of breath  HPI: Matthew Zamora is a 82 y.o. 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  Patient states that his shortness of breath started yesterday, which has been progressively worsening.  Patient has dry cough, no chest pain.  No fever or chills.  Patient is normally using 2 L oxygen, which has to be increased to 3 L, but still has severe respiratory distress, cannot speak in full sentence, initially BiPAP is started in the ED.  After treatment with nebulizer and Solu-Medrol 125 mg, his respiratory function has improved, BiPAP is discontinued and changed to 3 L oxygen with 98% of saturation. Patient does not have nausea, vomiting, diarrhea or abdominal pain.  No symptoms of UTI.  He states that he took Eliquis yesterday.  Data reviewed independently and ED Course: pt was found to have BNP 717, troponin 13 --> 16, stable renal function, temperature normal, blood pressure 134/95, heart rate 114, RR 20.  Chest x-ray showed mild vascular congestion.  Patient is admitted to PCU as inpatient.   EKG: I have personally reviewed.  Seems to be A-fib, QTc 464, heart rate 121, right axis deviation, early R wave progression   Review of Systems:   General: no fevers, chills, no body weight gain, has fatigue HEENT: no blurry vision, hearing changes or sore throat Respiratory: has dyspnea, coughing, wheezing CV: no chest pain, no palpitations GI: no nausea, vomiting, abdominal pain, diarrhea, constipation GU: no dysuria, burning on urination, increased urinary frequency, hematuria  Ext: has leg  edema Neuro: no unilateral weakness, numbness, or tingling, no vision change or hearing loss Skin: no rash, no skin tear. MSK: No muscle spasm, no deformity, no limitation of range of movement in spin Heme: No easy bruising.  Travel history: No recent long distant travel.   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 pylori infection    History of hepatitis    Hyperlipidemia    Hypertension    Liver cyst 05/16/15   PAF (paroxysmal atrial fibrillation) ([name])    Prostate CA ([name])     Past Surgical History:  Procedure Laterality Date   CATARACT EXTRACTION     COLONOSCOPY  10/06/2014, 09/18/2004, 03/14/2009   ESOPHAGOGASTRODUODENOSCOPY  10/06/2014, 03/18/2001, 03/14/2009   ESOPHAGOGASTRODUODENOSCOPY (EGD) WITH PROPOFOL N/A 05/07/2018   Procedure: ESOPHAGOGASTRODUODENOSCOPY (EGD) WITH PROPOFOL;  Surgeon: Toledo, Boykin Nearing, MD;  Location: [facility];  Service: Gastroenterology;  Laterality: N/A;   ESOPHAGOGASTRODUODENOSCOPY (EGD) WITH PROPOFOL N/A 04/22/2022   Procedure: ESOPHAGOGASTRODUODENOSCOPY (EGD) WITH PROPOFOL;  Surgeon: Allegra Lai,  Loel Dubonnet, MD;  Location: [facility];  Service: Endoscopy;  Laterality: N/A;   FLEXIBLE SIGMOIDOSCOPY  08/26/1990   HIP ARTHROPLASTY Right 10/31/2022   Procedure: ARTHROPLASTY BIPOLAR HIP (HEMIARTHROPLASTY)-RNFA;  Surgeon: Reinaldo Berber, MD;  Location: [facility] ORS;  Service: Orthopedics;  Laterality: Right;   INSERTION OF  ICD     PROSTATE SURGERY     TONSILLECTOMY      Social History:  reports that he quit smoking about 23 years ago. His smoking use included cigarettes. He has a 38.00 pack-year smoking history. He has never used smokeless tobacco. He reports that he does not currently use drugs. He reports that he does not drink alcohol.  Family History:  Family History  Problem Relation Age of Onset   Heart attack Mother    Heart attack Father      Prior to Admission medications   Medication Sig Start Date End Date Taking? Authorizing Provider  acetaminophen (TYLENOL) 325 MG tablet Take 2 tablets (650 mg total) by mouth every 4 (four) hours as needed for headache or mild pain. 10/29/19   Judithe Modest, NP  albuterol (PROVENTIL) (2.5 MG/3ML) 0.083% nebulizer solution Inhale 3 mLs (2.5 mg total) into the lungs as needed for shortness of breath. 10/29/19   Judithe Modest, NP  ascorbic acid (VITAMIN C) 500 MG tablet Take 1 tablet by mouth daily.    [provider]  atorvastatin (LIPITOR) 80 MG tablet Take 80 mg by mouth daily. 05/13/21   [provider]  calcium carbonate (TUMS EX) 750 MG chewable tablet Chew 1 tablet by mouth daily.    [provider]  ELIQUIS 2.5 MG TABS tablet Take 2.5 mg by mouth 2 (two) times daily. 12/28/21   [provider]  fluticasone-salmeterol (ADVAIR) 500-50 MCG/ACT AEPB Inhale 1 puff into the lungs in the morning and at bedtime. 04/05/22   [provider]  furosemide (LASIX) 20 MG tablet Take 1 tablet (20 mg total) by mouth daily. 11/19/22   Enedina Finner, MD  gabapentin (NEURONTIN) 100 MG capsule Take 100 mg by mouth at bedtime. 12/09/19   [provider]  guaiFENesin-dextromethorphan (ROBITUSSIN DM) 100-10 MG/5ML syrup Take 5 mLs by mouth every 4 (four) hours as needed for cough. 11/18/22   Enedina Finner, MD  HYDROcodone-acetaminophen (NORCO/VICODIN) 5-325 MG tablet Take 1-2 tablets by mouth every 4 (four) hours as needed for  moderate pain (pain score 4-6). 11/02/22   Dedra Skeens, PA-C  magnesium oxide (MAG-OX) 400 (240 Mg) MG tablet Take 1 tablet by mouth daily. 02/05/21   [provider]  melatonin 3 MG TABS tablet Take 9 mg by mouth at bedtime.    [provider]  metoCLOPramide (REGLAN) 5 MG tablet Take 1 tablet (5 mg total) by mouth every 8 (eight) hours as needed for refractory nausea / vomiting. 05/13/22   Delfino Lovett, MD  metoprolol tartrate (LOPRESSOR) 25 MG tablet Take 1 tablet (25 mg total) by mouth 2 (two) times daily. 11/18/22   Enedina Finner, MD  midodrine (PROAMATINE) 5 MG tablet Take 1 tablet (5 mg total) by mouth 3 (three) times daily with meals. 11/18/22   Enedina Finner, MD  Multiple Vitamin (MULTIVITAMIN WITH MINERALS) TABS tablet Take 1 tablet by mouth daily. 10/29/19   Judithe Modest, NP  Nebulizer MISC 1 each by Does not apply route as needed. 06/16/21   Rolly Salter, MD  Nutritional Supplements (,FEEDING SUPPLEMENT, PROSOURCE PLUS) liquid Take 30 mLs by mouth 3 (three) times daily  between meals. 05/13/22   Delfino Lovett, MD  ondansetron (ZOFRAN-ODT) 4 MG disintegrating tablet Take 4 mg by mouth every 8 (eight) hours as needed for refractory nausea / vomiting, vomiting or nausea.    [provider]  pantoprazole (PROTONIX) 40 MG tablet Take 1 tablet (40 mg total) by mouth 2 (two) times daily. 05/13/22   Delfino Lovett, MD  polyethylene glycol (MIRALAX / GLYCOLAX) 17 g packet Take 17 g by mouth 2 (two) times daily. 11/07/22   Leeroy Bock, MD  pyridostigmine (MESTINON) 60 MG tablet Take 1 tablet (60 mg total) by mouth every 8 (eight) hours. 05/13/22   Delfino Lovett, MD  sertraline (ZOLOFT) 25 MG tablet Take 25 mg by mouth daily.    [provider]  sucralfate (CARAFATE) 1 GM/10ML suspension Take 10 mLs (1 g total) by mouth 4 (four) times daily -  with meals and at bedtime. 05/13/22   Delfino Lovett, MD  tamsulosin (FLOMAX) 0.4 MG CAPS capsule Take 0.4 mg by mouth daily.  12/09/19   [provider]    Physical Exam: Vitals:   12/17/22 0545 12/17/22 0600 12/17/22 0747 12/17/22 1050  BP:  (!) 134/95    Pulse: (!) 114 (!) 114    Resp:      Temp:      TempSrc:      SpO2:  98%  100%  Height:   5\' 8"  (1.727 m)    General: initially pt was in acute respiratory distress HEENT:       Eyes: PERRL, EOMI, no jaundice       ENT: No discharge from the ears and nose, no pharynx injection, no tonsillar enlargement.        Neck: positive JVD, no bruit, no mass felt. Heme: No neck lymph node enlargement. Cardiac: S1/S2, RRR, No murmurs, No gallops or rubs. Respiratory: has wheezing bilaterally GI: Soft, nondistended, nontender, no rebound pain, no organomegaly, BS present. GU: No hematuria Ext: Has trace leg edema bilaterally. 1+DP/PT pulse bilaterally. Musculoskeletal: No joint deformities, No joint redness or warmth, no limitation of ROM in spin. Skin: No rashes.  Neuro: Alert, oriented X3, cranial nerves II-XII grossly intact, moves all extremities normally. Psych: Patient is not psychotic, no suicidal or hemocidal ideation.  Labs on Admission: I have personally reviewed following labs and imaging studies  CBC: Recent Labs  Lab 12/17/22 0202  WBC 9.6  HGB 9.8*  HCT 32.1*  MCV 96.7  PLT 196   Basic Metabolic Panel: Recent Labs  Lab 12/17/22 0202  NA 137  K 4.3  CL 105  CO2 23  GLUCOSE 113*  BUN 20  CREATININE 1.39*  CALCIUM 8.7*   GFR: Estimated Creatinine Clearance: 39.7 mL/min (A) (by C-G formula based on SCr of 1.39 mg/dL (H)). Liver Function Tests: Recent Labs  Lab 12/17/22 0202  AST 22  ALT 12  ALKPHOS 89  BILITOT 0.8  PROT 6.2*  ALBUMIN 3.5   No results for input(s): "LIPASE", "AMYLASE" in the last 168 hours. No results for input(s): "AMMONIA" in the last 168 hours. Coagulation Profile: No results for input(s): "INR", "PROTIME" in the last 168 hours. Cardiac Enzymes: No results for input(s): "CKTOTAL", "CKMB",  "CKMBINDEX", "TROPONINI" in the last 168 hours. BNP (last 3 results) No results for input(s): "PROBNP" in the last 8760 hours. HbA1C: No results for input(s): "HGBA1C" in the last 72 hours. CBG: No results for input(s): "GLUCAP" in the last 168 hours. Lipid Profile: No results for input(s): "CHOL", "HDL", "  LDLCALC", "TRIG", "CHOLHDL", "LDLDIRECT" in the last 72 hours. Thyroid Function Tests: No results for input(s): "TSH", "T4TOTAL", "FREET4", "T3FREE", "THYROIDAB" in the last 72 hours. Anemia Panel: No results for input(s): "VITAMINB12", "FOLATE", "FERRITIN", "TIBC", "IRON", "RETICCTPCT" in the last 72 hours. Urine analysis:    Component Value Date/Time   COLORURINE YELLOW (A) 04/17/2022 1105   APPEARANCEUR HAZY (A) 04/17/2022 1105   LABSPEC 1.008 04/17/2022 1105   PHURINE 7.0 04/17/2022 1105   GLUCOSEU 50 (A) 04/17/2022 1105   HGBUR NEGATIVE 04/17/2022 1105   BILIRUBINUR NEGATIVE 04/17/2022 1105   KETONESUR NEGATIVE 04/17/2022 1105   PROTEINUR NEGATIVE 04/17/2022 1105   NITRITE NEGATIVE 04/17/2022 1105   LEUKOCYTESUR NEGATIVE 04/17/2022 1105   Sepsis Labs: @LABRCNTIP (procalcitonin:4,lacticidven:4) )No results found for this or any previous visit (from the past 240 hour(s)).   Radiological Exams on Admission: DG Chest Portable 1 View  Result Date: 12/17/2022 CLINICAL DATA:  Shortness of breath EXAM: PORTABLE CHEST 1 VIEW COMPARISON:  11/10/2022 FINDINGS: Cardiac shadow is stable. Defibrillator is again noted. Lungs are well aerated without focal infiltrate. Mild vascular congestion is seen. IMPRESSION: Mild vascular congestion.  No focal infiltrate is noted. Electronically Signed   By: Alcide Clever M.D.   On: 12/17/2022 02:43      Assessment/Plan Principal Problem:   Acute on chronic respiratory failure with hypoxia ([name]) Active Problems:   Acute on chronic systolic CHF (congestive heart failure) ([name])   COPD exacerbation ([name])   Atrial fibrillation with RVR ([name])   CKD  (chronic kidney disease), stage IIIb   Depression   Essential hypertension   Hyperlipidemia   Normocytic anemia   Protein-calorie malnutrition, moderate ([name])   Assessment and Plan:  Acute on chronic respiratory failure with hypoxia due to combination of COPD and CHF exacerbation: Patient has SOB, cough, wheezing, indicating COPD exacerbation.  Patient has leg edema, elevated BNP 717, vascular congestion on chest x-ray, consistent with CHF exacerbation.  -Admitted to PCU as inpatient -Bronchodilators -Solu-Medrol for COPD exacerbation -IV Lasix for CHF exacerbation -BiPAP --> when weaning off BiPAP, will start Nasal cannula oxygen to maintain oxygen saturation above 93%  Acute on chronic systolic CHF (congestive heart failure) ([name]): 2D echo on 11/10/2022 showed EF 30-35%, now has CHF exacerbation. -Lasix 40 mg bid by IV -Daily weights -strict I/O's -Low salt diet -Fluid restriction -As needed bronchodilators for shortness of breath  COPD exacerbation ([name]): -Bronchodilators -Solu-Medrol 40 mg IV bid -Mucinex for cough  -Incentive spirometry -sputum culture -check RVP and covid19  Atrial fibrillation with RVR ([name]): HR 110-120s.  Patient was given 10 mg of IV Cardizem, 25 mg of oral metoprolol in ED -Continue Eliquis -Continue home metoprolol, increased dose from 12.5 to 25 mg twice daily -As needed IV metoprolol for heart rate  >125. -If heart rate is not controlled, will start Cardizem drip  CKD (chronic kidney disease), stage IIIb: Renal function stable.  Recent baseline creatinine 1.3-1.5.  His creatinine is 1.39, BUN 20, GFR 51 -Follow-up with BMP  Depression and anxiety -Continue home medications: Zoloft -As needed Ativan for anxiety  Essential hypertension -IV hydralazine as needed -Metoprolol  Hyperlipidemia -Lipitor  Normocytic anemia: Hemoglobin stable, 9.8 (9.9 on 11/15/2022) -Follow-up with CBC  Protein calorie malnutrition-moderate: Body weight 63.3  kg, BMI 22.23 -Ensure nutrition supplement -Nutrition consult    DVT ppx: on Eliquis  Code Status: Full code   Family Communication:  Yes, patient's son  by phone  Disposition Plan:  Anticipate discharge back to previous environment  Consults  called:  none  Admission status and Level of care: Progressive: as inpt     Dispo: The patient is from: Home              Anticipated d/c is to: Home              Anticipated d/c date is: 2 days              Patient currently is not medically stable to d/c.    Severity of Illness:  The appropriate patient status for this patient is INPATIENT. Inpatient status is judged to be reasonable and necessary in order to provide the required intensity of service to ensure the patient's safety. The patient's presenting symptoms, physical exam findings, and initial radiographic and laboratory data in the context of their chronic comorbidities is felt to place them at high risk for further clinical deterioration. Furthermore, it is not anticipated that the patient will be medically stable for discharge from the hospital within 2 midnights of admission.   * I certify that at the point of admission it is my clinical judgment that the patient will require inpatient hospital care spanning beyond 2 midnights from the point of admission due to high intensity of service, high risk for further deterioration and high frequency of surveillance required.*       Date of Service 12/17/2022    Lorretta Harp [facility]   If 7PM-7AM, please contact night-coverage [email or web address] 12/17/2022, 11:05 AM

## 2022-12-18 DIAGNOSIS — J9621 Acute and chronic respiratory failure with hypoxia: Secondary | ICD-10-CM | POA: Diagnosis not present

## 2022-12-18 LAB — CBC
HCT: 28.1 % — ABNORMAL LOW (ref 39.0–52.0)
Hemoglobin: 9.4 g/dL — ABNORMAL LOW (ref 13.0–17.0)
MCH: 30.4 pg (ref 26.0–34.0)
MCHC: 33.5 g/dL (ref 30.0–36.0)
MCV: 90.9 fL (ref 80.0–100.0)
Platelets: 199 10*3/uL (ref 150–400)
RBC: 3.09 MIL/uL — ABNORMAL LOW (ref 4.22–5.81)
RDW: 14.6 % (ref 11.5–15.5)
WBC: 10.3 10*3/uL (ref 4.0–10.5)
nRBC: 0 % (ref 0.0–0.2)

## 2022-12-18 LAB — BASIC METABOLIC PANEL
Anion gap: 10 (ref 5–15)
BUN: 32 mg/dL — ABNORMAL HIGH (ref 8–23)
CO2: 27 mmol/L (ref 22–32)
Calcium: 9.2 mg/dL (ref 8.9–10.3)
Chloride: 96 mmol/L — ABNORMAL LOW (ref 98–111)
Creatinine, Ser: 1.86 mg/dL — ABNORMAL HIGH (ref 0.61–1.24)
GFR, Estimated: 36 mL/min — ABNORMAL LOW (ref >60)
Glucose, Bld: 119 mg/dL — ABNORMAL HIGH (ref 70–99)
Potassium: 4.5 mmol/L (ref 3.5–5.1)
Sodium: 133 mmol/L — ABNORMAL LOW (ref 135–145)

## 2022-12-18 LAB — BRAIN NATRIURETIC PEPTIDE: B Natriuretic Peptide: 448.3 pg/mL — ABNORMAL HIGH (ref 0.0–100.0)

## 2022-12-18 LAB — MAGNESIUM: Magnesium: 2.2 mg/dL (ref 1.7–2.4)

## 2022-12-18 LAB — DIGOXIN LEVEL: Digoxin Level: 0.6 ng/mL — ABNORMAL LOW (ref 0.8–2.0)

## 2022-12-18 MED ORDER — DILTIAZEM HCL 25 MG/5ML IV SOLN
INTRAVENOUS | Status: AC
  Filled 2022-12-18: qty 5

## 2022-12-18 MED ORDER — METOPROLOL TARTRATE 5 MG/5ML IV SOLN
5.0000 mg | Freq: Once | INTRAVENOUS | Status: AC
  Administered 2022-12-18: 5 mg via INTRAVENOUS
  Filled 2022-12-18: qty 5

## 2022-12-18 MED ORDER — ORAL CARE MOUTH RINSE: 15.0000 mL | OROMUCOSAL | Status: DC | PRN

## 2022-12-18 MED ORDER — LEVALBUTEROL HCL 1.25 MG/0.5ML IN NEBU
1.2500 mg | INHALATION_SOLUTION | Freq: Three times a day (TID) | RESPIRATORY_TRACT | Status: DC
  Administered 2022-12-18 – 2022-12-20 (×6): 1.25 mg via RESPIRATORY_TRACT
  Filled 2022-12-18 (×6): qty 0.5

## 2022-12-18 MED ORDER — FUROSEMIDE 20 MG PO TABS
20.0000 mg | ORAL_TABLET | Freq: Every day | ORAL | Status: DC
  Filled 2022-12-18: qty 1

## 2022-12-18 MED ORDER — DIGOXIN 0.25 MG/ML IJ SOLN
0.2500 mg | Freq: Four times a day (QID) | INTRAMUSCULAR | Status: AC
  Administered 2022-12-18 – 2022-12-19 (×2): 0.25 mg via INTRAVENOUS
  Filled 2022-12-18 (×2): qty 2

## 2022-12-18 MED ORDER — DILTIAZEM HCL 25 MG/5ML IV SOLN
10.0000 mg | Freq: Once | INTRAVENOUS | Status: DC
  Filled 2022-12-18: qty 5

## 2022-12-18 MED ORDER — IPRATROPIUM BROMIDE 0.02 % IN SOLN
0.5000 mg | Freq: Three times a day (TID) | RESPIRATORY_TRACT | Status: DC
  Administered 2022-12-18 – 2022-12-20 (×6): 0.5 mg via RESPIRATORY_TRACT
  Filled 2022-12-18 (×6): qty 2.5

## 2022-12-18 NOTE — Progress Notes (Signed)
Progress Note   Patient: Matthew Zamora:[id number] DOB: 1941/03/10 DOA: 12/17/2022     1 DOS: the patient was seen and examined on 12/18/2022   Brief hospital course:  Matthew Zamora is a 82 y.o. 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   Patient states that his shortness of breath started yesterday, which has been progressively worsening.  Patient has dry cough, no chest pain.  No fever or chills.  Patient is normally using 2 L oxygen, which has to be increased to 3 L, but still has severe respiratory distress, cannot speak in full sentence, initially BiPAP is started in the ED.  After treatment with nebulizer and Solu-Medrol 125 mg, his respiratory function has improved, BiPAP is discontinued and changed to 3 L oxygen with 98% of saturation. Patient does not have nausea, vomiting, diarrhea or abdominal pain.  No symptoms of UTI.  He states that he took Eliquis yesterday.   Data reviewed independently and ED Course: pt was found to have BNP 717, troponin 13 --> 16, stable renal function, temperature normal, blood pressure 134/95, heart rate 114, RR 20.  Chest x-ray showed mild vascular congestion.  Patient is admitted to PCU as inpatient.      Assessment and Plan:  Acute on chronic respiratory failure with hypoxia due to acute COPD/CHF exacerbation:  Patient has SOB, cough, wheezing, indicating COPD exacerbation.  Patient has leg edema, elevated BNP 717, vascular congestion on chest x-ray, consistent with CHF exacerbation. Continue as needed and scheduled bronchodilator therapy -Discontinue IV Lasix and switch patient to oral Lasix due to worsening renal function -Continue inhaled steroids   Acute on chronic systolic CHF (congestive heart failure) ([name]): 2D echo on 11/10/2022 showed EF 30-35%,  Switch  Lasix to oral due to worsening renal function Maintain low-sodium diet Continue metoprolol      Atrial fibrillation with RVR ([name]): HR 110-120s.  Patient was given 10 mg of IV Cardizem, 25 mg of oral metoprolol in ED -Continue Eliquis -Continue home metoprolol, increased dose from 12.5 to 25 mg twice daily as well as digoxin -Check digoxin levels   Acute on CKD (chronic kidney disease), stage IIIb:  Noted to have worsening renal function due to diuresis DC IV Lasix Monitor renal function closely Recent baseline creatinine 1.3-1.5.  Serum creatinine up to 1.8    Depression and anxiety -Continue home medications: Zoloft -As needed Ativan for anxiety   Essential hypertension -IV hydralazine as needed -Metoprolol   Hyperlipidemia -Lipitor   Normocytic anemia: Hemoglobin stable, 9.8 (9.9 on 11/15/2022) -Follow-up with CBC   Protein calorie malnutrition-moderate: Body weight 63.3 kg, BMI 22.23 -Ensure nutrition supplement -Nutrition consult     Stage II bilateral pressure injury involving both buttocks Turn frequently DuoDERM every 72 hours        Subjective: Patient is seen and examined at bedside.  Feels better  Physical Exam: Vitals:   12/18/22 0728 12/18/22 0848 12/18/22 1133 12/18/22 1611  BP:  97/77 101/71 103/65  Pulse:  (!) 125 (!) 127 (!) 125  Resp:  16 18 16   Temp:  98 F (36.7 C) 98 F (36.7 C) 98.2 F (36.8 C)  TempSrc:  Oral Oral Oral  SpO2: 96% 98% 100% 100%  Weight:      Height:      General: Appears comfortable and in no  distress HEENT:       Eyes: PERRL, EOMI, no jaundice       ENT: No discharge from the ears and nose, no pharynx injection, no tonsillar enlargement.        Neck: positive JVD, no bruit, no mass felt. Heme: No neck lymph node enlargement. Cardiac : Tachycardic Respiratory: has wheezing bilaterally GI: Soft, nondistended, nontender, no rebound pain, no organomegaly, BS present. GU: No hematuria Ext: Has trace leg edema  bilaterally. 1+DP/PT pulse bilaterally. Musculoskeletal: No joint deformities, No joint redness or warmth, no limitation of ROM in spin. Skin: No rashes.  Neuro: Alert, oriented X3, cranial nerves II-XII grossly intact, moves all extremities normally. Psych: Patient is not psychotic, no suicidal or hemocidal ideation.  Data Reviewed: Labs reviewed.  Sodium 133, BUN 32, creatinine 1.86   Family Communication: Discussed plan of care with patient  Disposition: Status is: Inpatient Remains inpatient appropriate because: Rate control for his A-fib  Planned Discharge Destination: Home    Time spent: 35 minutes  Author: Lucile Shutters, MD 12/18/2022 4:14 PM  For on call review [email or web address].

## 2022-12-18 NOTE — Progress Notes (Signed)
Notified Dr. Joylene Igo that patient's blood pressure is 90/58 and heart rate 125, Cardizem has not been given. MD gave order to not give Cardizem push.

## 2022-12-18 NOTE — Progress Notes (Signed)
Initial Nutrition Assessment  DOCUMENTATION CODES:   Non-severe (moderate) malnutrition in context of chronic illness  INTERVENTION:   -Liberalize diet to 2 gram sodium for wider variety of meal selections -MVI with minerals daily -30 ml Prosource Plus BID, each supplement provides 100 kcals and 15 grams protein -Magic cup TID with meals, each supplement provides 290 kcal and 9 grams of protein   NUTRITION DIAGNOSIS:   Moderate Malnutrition related to chronic illness (COPD, CHF) as evidenced by moderate fat depletion, mild fat depletion, mild muscle depletion, moderate muscle depletion.  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  Pt admitted with COPD and CHF exacerbation.   Reviewed I/O's: -1.6 L x 24 hours  UOP: 2.1 L x 24 hours  Spoke with pt at bedside, who was pleasant and in good spirits at time of visit. Laughing and joking with with RD during interview. History was hard to obtain, as pt answered multiple phone calls during visit.   Pt reports fair appetite PTA. He consumed 100% of his breakfast, but complained of restrictions of heart healthy diet. Pt consumes 3 meals per day PTA but admits "I don't eat a whole lot".   Reviewed wt hx;no wt loss noted over the past 3 months.   Discussed importance of good meal and supplement intake to promote healing. Pt amenable to supplements, but does not like Ensure.   Medications reviewed and include vitamin C, calcium carbonate, cardizem, lasix, neurontin, magnesium oxide, and carafate.    Labs reviewed: Na: 133, CBGS: 89.    NUTRITION -  FOCUSED PHYSICAL EXAM:  Flowsheet Row Most Recent Value  Orbital Region Severe depletion  Upper Arm Region Mild depletion  Thoracic and Lumbar Region Mild depletion  Buccal Region Mild depletion  Temple Region Severe depletion  Clavicle Bone Region Moderate depletion  Clavicle and Acromion Bone Region Moderate depletion  Scapular Bone Region Moderate depletion  Dorsal Hand Moderate depletion  Patellar Region Moderate depletion  Anterior Thigh Region Moderate depletion  Posterior Calf Region Moderate depletion  Edema (RD Assessment) None  Hair Reviewed  Eyes Reviewed  Mouth Reviewed  Skin Reviewed  Nails Reviewed       Diet Order:   Diet Order             Diet 2 gram sodium Fluid consistency: Thin  Diet effective now                   EDUCATION NEEDS:   Education needs have been addressed  Skin:  Skin Assessment: Reviewed RN Assessment  Last BM:  12/17/22  Height:   Ht Readings from Last 1 Encounters:  12/17/22 5\' 8"  (1.727 m)    Weight:   Wt Readings from Last 1 Encounters:  12/17/22 66 kg    Ideal Body Weight:  70 kg  BMI:  Body mass index is 22.12 kg/m.  Estimated Nutritional Needs:   Kcal:  1800-2000  Protein:  100-115 grams  Fluid:  1.8-2.0 L    Levada Schilling, RD, LDN, CDCES Registered Dietitian II Certified Diabetes Care and Education Specialist Please refer to [facility] for RD and/or  RD on-call/weekend/after hours pager

## 2022-12-18 NOTE — Consult Note (Signed)
[facility] CARDIOLOGY CONSULT NOTE       Patient ID: Matthew Zamora MRN: [id number] DOB/AGE: Jan 08, 1941 82 y.o.  Admit date: 12/17/2022 Referring Physician dr agbata Primary Physician Dr. Jerl Mina Primary Cardiologist Dr. 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  Cardiology is consulted on hospital day 2 for assistance with his heart failure.  The patient is well-known to our service from multiple prior admissions, most recently just over 1 month ago for COPD exacerbation/acute on chronic HFrEF.  He was recently seen by his regular cardiologist on 6/28 where the patient was doing okay from a cardiac perspective, but was hypotensive in the office thus Lasix was discontinued and metoprolol was decreased to 12.5 mg twice daily.  He presented to [facility] ED yesterday evening with shortness of breath and wheezing.  He was given Solu-Medrol and DuoNebs with improvement, as well as 2 doses of IV Lasix 40 mg with brisk diuresis.  At my time of evaluation this afternoon he was ambulating in the room with nursing assistance and says he feels "great."  He denies chest pain, shortness of breath, heart racing or palpitations.  No orthopnea, PND, or lower extremity edema.  He "feels like he is dehydrated" and points out areas of wrinkling and dimpling on the skin of his chest and arms.  Recent labs are notable for an uptrending creatinine from 1.39--1.86 after receiving IV Lasix yesterday, and current GFR 36.  BNP on admission yesterday was 717.  Chest x-ray with mild vascular congestion.    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 pylori infection    History of hepatitis    Hyperlipidemia    Hypertension    Liver cyst 05/16/15   PAF (paroxysmal atrial fibrillation) ([name])    Prostate CA [facility])     Past Surgical History:  Procedure Laterality Date   CATARACT EXTRACTION     COLONOSCOPY  10/06/2014, 09/18/2004, 03/14/2009   ESOPHAGOGASTRODUODENOSCOPY  10/06/2014, 03/18/2001, 03/14/2009   ESOPHAGOGASTRODUODENOSCOPY (EGD) WITH PROPOFOL N/A 05/07/2018   Procedure: ESOPHAGOGASTRODUODENOSCOPY (EGD) WITH PROPOFOL;  Surgeon: Toledo, Boykin Nearing, MD;  Location: [facility] ENDOSCOPY;  Service: Gastroenterology;  Laterality: N/A;   ESOPHAGOGASTRODUODENOSCOPY (EGD) WITH PROPOFOL N/A 04/22/2022   Procedure: ESOPHAGOGASTRODUODENOSCOPY (EGD) WITH PROPOFOL;  Surgeon: Toney Reil, MD;  Location: [facility] ENDOSCOPY;  Service: Endoscopy;  Laterality: N/A;   FLEXIBLE SIGMOIDOSCOPY  08/26/1990   HIP ARTHROPLASTY Right 10/31/2022   Procedure: ARTHROPLASTY BIPOLAR HIP (HEMIARTHROPLASTY)-RNFA;  Surgeon: Reinaldo Berber, MD;  Location: [facility] ORS;  Service: Orthopedics;  Laterality: Right;   INSERTION OF ICD     PROSTATE SURGERY     TONSILLECTOMY      Medications Prior to Admission  Medication Sig Dispense Refill Last Dose   acetaminophen (TYLENOL) 325 MG tablet  Take 2 tablets (650 mg total) by mouth every 4 (four) hours as needed for headache or mild pain.   Past Week   albuterol (PROVENTIL) (2.5 MG/3ML) 0.083% nebulizer solution Inhale 3 mLs (2.5 mg total) into the lungs as needed for shortness of breath. 75 mL 12 12/16/2022   ascorbic acid (VITAMIN C) 500 MG tablet Take 1 tablet by mouth  daily.   12/16/2022   atorvastatin (LIPITOR) 80 MG tablet Take 80 mg by mouth daily.   12/15/2022   calcium carbonate (TUMS EX) 750 MG chewable tablet Chew 1 tablet by mouth daily.   Past Week   ELIQUIS 2.5 MG TABS tablet Take 2.5 mg by mouth 2 (two) times daily.   12/16/2022 at am   fluticasone-salmeterol (ADVAIR) 500-50 MCG/ACT AEPB Inhale 1 puff into the lungs in the morning and at bedtime.   12/16/2022   guaiFENesin-dextromethorphan (ROBITUSSIN DM) 100-10 MG/5ML syrup Take 5 mLs by mouth every 4 (four) hours as needed for cough. 118 mL 0 prn at unk   magnesium oxide (MAG-OX) 400 (240 Mg) MG tablet Take 1 tablet by mouth daily.   12/16/2022   melatonin 3 MG TABS tablet Take 9 mg by mouth at bedtime.   12/16/2022   metoCLOPramide (REGLAN) 5 MG tablet Take 1 tablet (5 mg total) by mouth every 8 (eight) hours as needed for refractory nausea / vomiting.   12/16/2022   metoprolol succinate (TOPROL-XL) 25 MG 24 hr tablet Take 12.5 mg by mouth 2 (two) times daily.   12/16/2022   midodrine (PROAMATINE) 5 MG tablet Take 1 tablet (5 mg total) by mouth 3 (three) times daily with meals. (Patient taking differently: Take 30 mg by mouth 3 (three) times daily with meals.) 90 tablet 1 12/16/2022 at 1200   Multiple Vitamin (MULTIVITAMIN WITH MINERALS) TABS tablet Take 1 tablet by mouth daily.   12/16/2022   ondansetron (ZOFRAN-ODT) 4 MG disintegrating tablet Take 4 mg by mouth every 8 (eight) hours as needed for refractory nausea / vomiting, vomiting or nausea.   Past Week   pantoprazole (PROTONIX) 40 MG tablet Take 1 tablet (40 mg total) by mouth 2 (two) times daily.   12/16/2022   polyethylene glycol (MIRALAX / GLYCOLAX) 17 g packet Take 17 g by mouth 2 (two) times daily. 14 each 0 Past Week   pyridostigmine (MESTINON) 60 MG tablet Take 1 tablet (60 mg total) by mouth every 8 (eight) hours.   12/16/2022   sertraline (ZOLOFT) 25 MG tablet Take 25 mg by mouth daily.   12/16/2022   sucralfate (CARAFATE) 1 GM/10ML suspension Take 10 mLs (1  g total) by mouth 4 (four) times daily -  with meals and at bedtime. 420 mL 0 12/16/2022   tamsulosin (FLOMAX) 0.4 MG CAPS capsule Take 0.4 mg by mouth daily.   12/15/2022   furosemide (LASIX) 20 MG tablet Take 1 tablet (20 mg total) by mouth daily. (Patient not taking: Reported on 12/17/2022) 30 tablet 0 Not Taking   gabapentin (NEURONTIN) 100 MG capsule Take 100 mg by mouth at bedtime.   12/15/2022   HYDROcodone-acetaminophen (NORCO/VICODIN) 5-325 MG tablet Take 1-2 tablets by mouth every 4 (four) hours as needed for moderate pain (pain score 4-6). (Patient not taking: Reported on 12/17/2022) 30 tablet 0 Completed Course   metoprolol tartrate (LOPRESSOR) 25 MG tablet Take 1 tablet (25 mg total) by mouth 2 (two) times daily. (Patient not taking: Reported on 12/17/2022) 60 tablet 1 Not Taking   Nebulizer MISC 1  each by Does not apply route as needed. 1 each 0    Nutritional Supplements (,FEEDING SUPPLEMENT, PROSOURCE PLUS) liquid Take 30 mLs by mouth 3 (three) times daily between meals.      Social History   Socioeconomic History   Marital status: Single    Spouse name: Not on file   Number of children: Not on file   Years of education: Not on file   Highest education level: Not on file  Occupational History   Not on file  Tobacco Use   Smoking status: Former    Packs/day: 1.00    Years: 38.00    Additional pack years: 0.00    Total pack years: 38.00    Types: Cigarettes    Quit date: 06/18/1999    Years since quitting: 23.5   Smokeless tobacco: Never  Vaping Use   Vaping Use: Never used  Substance and Sexual Activity   Alcohol use: No    Alcohol/week: 0.0 standard drinks of alcohol   Drug use: Not Currently   Sexual activity: Not Currently  Other Topics Concern   Not on file  Social History Narrative   Not on file   Social Determinants of Health   Financial Resource Strain: Not on file  Food Insecurity: No Food Insecurity (12/17/2022)   Hunger Vital Sign    Worried About Running Out  of Food in the Last Year: Never true    Ran Out of Food in the Last Year: Never true  Transportation Needs: No Transportation Needs (12/17/2022)   PRAPARE - Administrator, Civil Service (Medical): No    Lack of Transportation (Non-Medical): No  Physical Activity: Not on file  Stress: Not on file  Social Connections: Not on file  Intimate Partner Violence: Not At Risk (12/17/2022)   Humiliation, Afraid, Rape, and Kick questionnaire    Fear of Current or Ex-Partner: No    Emotionally Abused: No    Physically Abused: No    Sexually Abused: No    Family History  Problem Relation Age of Onset   Heart attack Mother    Heart attack Father       Intake/Output Summary (Last 24 hours) at 12/18/2022 1445 Last data filed at 12/18/2022 1300 Gross per 24 hour  Intake 1200 ml  Output 2950 ml  Net -1750 ml    Vitals:   12/18/22 0343 12/18/22 0728 12/18/22 0848 12/18/22 1133  BP: 121/81  97/77 101/71  Pulse: (!) 117  (!) 125 (!) 127  Resp: 20  16 18   Temp: (!) 97.5 F (36.4 C)  98 F (36.7 C) 98 F (36.7 C)  TempSrc:   Oral Oral  SpO2: 100% 96% 98% 100%  Weight:      Height:        PHYSICAL EXAM General: Pleasant thin elderly Caucasian male,  in no acute distress.  Ambulating with assistance in the room HEENT:  Normocephalic and atraumatic. Neck:  No JVD.  Lungs: Normal respiratory effort on 2 well by [place].  Decreased breath sounds with trace right-sided crackles without appreciable wheezes.   Heart: Tachycardia but regular. Normal S1 and S2 without gallops or murmurs.  Abdomen: Non-distended appearing.  Msk: Normal strength and tone for age. Extremities: Warm and well perfused. No clubbing, cyanosis.  No peripheral edema.  Neuro: Alert and oriented X 3. Psych:  Answers questions appropriately.   Labs: Basic Metabolic Panel: Recent Labs    12/17/22 0202 12/18/22 0437  NA 137 133*  K 4.3 4.5  CL 105 96*  CO2 23 27  GLUCOSE 113* 119*  BUN 20 32*  CREATININE 1.39*  1.86*  CALCIUM 8.7* 9.2  MG  --  2.2   Liver Function Tests: Recent Labs    12/17/22 0202  AST 22  ALT 12  ALKPHOS 89  BILITOT 0.8  PROT 6.2*  ALBUMIN 3.5   No results for input(s): "LIPASE", "AMYLASE" in the last 72 hours. CBC: Recent Labs    12/17/22 0202 12/18/22 0437  WBC 9.6 10.3  HGB 9.8* 9.4*  HCT 32.1* 28.1*  MCV 96.7 90.9  PLT 196 199   Cardiac Enzymes: Recent Labs    12/17/22 0202 12/17/22 0410  TROPONINIHS 13 16   BNP: Recent Labs    12/17/22 0202  BNP 717.5*   D-Dimer: No results for input(s): "DDIMER" in the last 72 hours. Hemoglobin A1C: No results for input(s): "HGBA1C" in the last 72 hours. Fasting Lipid Panel: No results for input(s): "CHOL", "HDL", "LDLCALC", "TRIG", "CHOLHDL", "LDLDIRECT" in the last 72 hours. Thyroid Function Tests: No results for input(s): "TSH", "T4TOTAL", "T3FREE", "THYROIDAB" in the last 72 hours.  Invalid input(s): "FREET3" Anemia Panel: No results for input(s): "VITAMINB12", "FOLATE", "FERRITIN", "TIBC", "IRON", "RETICCTPCT" in the last 72 hours.   Radiology: DG Chest Portable 1 View  Result Date: 12/17/2022 CLINICAL DATA:  Shortness of breath EXAM: PORTABLE CHEST 1 VIEW COMPARISON:  11/10/2022 FINDINGS: Cardiac shadow is stable. Defibrillator is again noted. Lungs are well aerated without focal infiltrate. Mild vascular congestion is seen. IMPRESSION: Mild vascular congestion.  No focal infiltrate is noted. Electronically Signed   By: Alcide Clever M.D.   On: 12/17/2022 02:43    TELEMETRY reviewed by me (LT) 12/18/2022 : Sinus tachycardia rate 120s  EKG reviewed by me: Sinus tachycardia rate 113  Data reviewed by me (LT) 12/18/2022: ED note, admission H&P last 24h vitals tele labs imaging I/O   Principal Problem:   Acute on chronic respiratory failure with hypoxia ([name]) Active Problems:   Essential hypertension   Hyperlipidemia   Atrial fibrillation with RVR ([name])   Acute on chronic systolic CHF (congestive  heart failure) ([name])   CKD (chronic kidney disease), stage IIIb   COPD exacerbation ([name])   Normocytic anemia   Depression   Protein-calorie malnutrition, moderate ([name])    ASSESSMENT AND PLAN:  Matthew Zamora is an 75yoM with a PMH of 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  Cardiology is consulted on hospital day 2 for assistance with his heart failure.  # COPD with exacerbation  # chronic respiratory failure (2L)  - agree with current therapy per primary team   # acute on chronic HFrEF  # S/p primary prevention ICD Clinically euvolemic on exam with only trace right basilar crackles without peripheral edema or other heart failure symptoms.  Diuresed briskly with 40 mg of IV Lasix x 2 yesterday. - continue home midodrine 5mg  TID -Decrease metoprolol tartrate to 12.5 mg twice daily -Hold further IV diuresis consider as needed Lasix 20 mg daily for peripheral edema, weight gain - GDMT additions of ARB/ARNI & SGLT2i limited with BP and CKD. Intolerant of spiro.  -Defer additional cardiac diagnostics at this time   # paroxysmal AF RVR  #  hx VT  In sinus rhythm-sinus tach on telemetry historically rhythm controlled with amiodarone but discontinued recently due to elevated LFTs.  Continue metoprolol for rate control and Eliquis for stroke prevention and follow-up with [facility] EP at discharge   # CKD 3  Creatinine uptrending after IV diuresis, hold further IV Lasix as above  Cardiology will sign off. Please haiku with questions or re-engage if needed.    This patient's plan of care was discussed and created with Dr. Darrold Junker and he is in agreement.  Signed: Rebeca Allegra , PA-C 12/18/2022, 2:45  PM [facility] Cardiology

## 2022-12-18 NOTE — Progress Notes (Signed)
Made Dr. Joylene Igo aware of patient's BP 101/71 and that lasix and metoprolol morning doses are still due. MD gave order to hold lasix.

## 2022-12-18 NOTE — TOC Initial Note (Signed)
Transition of Care [facility]) - Initial/Assessment Note    Patient Details  Name: Matthew Zamora MRN: [id number] Date of Birth: [birth year]  Transition of Care [facility]) CM/SW Contact:    Matthew Hidden, RN Phone Number: 12/18/2022, 4:22 PM  Clinical Narrative:                 Patient admits from home with [facility] via [facility] for PT/ RN per his son, Matthew Zamora.   Patient lives alone but his son lives directly next door.  His son Matthew Zamora and daughter in law check  on him twice daily and brings meals They also take him to appointments  Patient's daughter-in-law manages his medications.  He has a a walker, wheelchair, and a BSC.  He also has a privately purchased oxygen machine.  Matthew Zamora and his daughter-in-law advised of potential co-payment days.    Expected Discharge Plan: Home w Home Health Services Barriers to Discharge: Continued Medical Work up   Patient Goals and CMS Choice Patient states their goals for this hospitalization and ongoing recovery are:: Return home          Expected Discharge Plan and Services                                       Date [facility] Agency Contacted: 12/18/22 Time HH Agency Contacted: 1621 Zamora spoke with at [facility] Agency: Matthew Zamora  Prior Living Arrangements/Services   Lives with:: Self Patient language and need for interpreter reviewed:: Yes Do you feel safe going back to the place where you live?: Yes      Need for Family Participation in Patient Care: Yes (Comment) Care giver support system in place?: Yes (comment) Current home services: Home PT, Home RN Criminal Activity/Legal Involvement Pertinent to Current Situation/Hospitalization: No - Comment as needed  Activities of Daily Living Home Assistive Devices/Equipment: Walker (specify type) ADL Screening (condition at time of admission) Patient's cognitive ability adequate to safely complete daily activities?: Yes Is the patient deaf or have difficulty hearing?: No Does the patient  have difficulty seeing, even when wearing glasses/contacts?: No Does the patient have difficulty concentrating, remembering, or making decisions?: No Patient able to express need for assistance with ADLs?: Yes Does the patient have difficulty dressing or bathing?: No Independently performs ADLs?: No Communication: Independent Dressing (OT): Independent Grooming: Independent Feeding: Independent Bathing: Independent Toileting: Independent In/Out Bed: Needs assistance Is this a change from baseline?: Change from baseline, expected to last <3 days Walks in Home: Needs assistance Is this a change from baseline?: Change from baseline, expected to last <3 days Does the patient have difficulty walking or climbing stairs?: Yes Weakness of Legs: Both Weakness of Arms/Hands: None  Permission Sought/Granted                  Emotional Assessment Appearance:: Other (Comment Required (Spoke with son, Matthew Zamora) Attitude/Demeanor/Rapport: Gracious, Engaged Affect (typically observed): Accepting   Alcohol / Substance Use: Not Applicable Psych Involvement: No (comment)  Admission diagnosis:  COPD exacerbation ([name]) [J44.1] Patient Active Problem List   Diagnosis Date Noted   Depression 12/17/2022   Protein-calorie malnutrition, moderate ([name]) 12/17/2022   Fall 11/06/2022   Closed displaced fracture of right femoral neck ([name]) 10/31/2022   Osteoporosis 10/30/2022   Femoral fracture ([name]) 10/30/2022   Epistaxis 05/12/2022   Protein-calorie malnutrition, severe 05/08/2022   Enteritis, enteropathogenic E. coli 04/29/2022   Diarrhea 04/28/2022  Multifocal pneumonia 04/23/2022   Erosive esophagitis 04/22/2022   Dizziness 04/21/2022   Intractable nausea and vomiting 04/21/2022   Dyspnea 04/20/2022   Hyponatremia 04/19/2022   Orthostatic hypotension 04/18/2022   Postural dizziness with near syncope 04/18/2022   Near syncope 04/17/2022   COPD (chronic obstructive pulmonary disease) ([name])  04/17/2022   Hyperkalemia 04/17/2022   Abnormal LFTs 04/17/2022   Chronic systolic CHF (congestive heart failure) ([name]) 04/17/2022   Normocytic anemia 04/17/2022   GERD without esophagitis 02/12/2022   Recurrent falls    Generalized weakness    NSVT (nonsustained ventricular tachycardia) ([name]) 02/11/2022   Acute on chronic combined systolic (congestive) and diastolic (congestive) heart failure ([name]) 08/10/2021   Acute respiratory failure with hypoxia ([name])    HCAP (healthcare-associated pneumonia) 06/13/2021   Acute on chronic systolic CHF (congestive heart failure) ([name]) 06/13/2021   Elevated troponin 06/13/2021   CKD (chronic kidney disease), 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   Lung nodule 06/13/2021   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   COPD exacerbation ([name]) 06/13/2021   Volume depletion 12/15/2019   Hypotension 12/15/2019   Cardiorenal syndrome    Atrial fibrillation with RVR ([name])    Defibrillator discharge    Tachycardia 10/25/2019   CHF (congestive heart failure) ([name]) 09/18/2019   Acute on chronic combined systolic and diastolic CHF (congestive heart failure) ([name]) 09/17/2019   CAD (coronary artery disease) 09/17/2019   Ischemic cardiomyopathy 09/17/2019   CKD (chronic kidney disease) stage 3, GFR 30-59 ml/min ([name]) 09/17/2019   Acute hypoxemic respiratory failure ([name]) 09/17/2018   AAA (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 ([name]) 07/06/2018   Essential hypertension 07/06/2018   Hyperlipidemia 07/06/2018   Paroxysmal atrial fibrillation ([name])    Acute on chronic respiratory failure with hypoxia ([name]) 04/26/2018   PCP:  Jerl Mina, MD Pharmacy:   [facility] [address] ([address]), [place] - [address] [place]) [place] [address] Phone: [phone] Fax: [phone]     Social Determinants of Health (SDOH) Social History: SDOH Screenings   Food Insecurity: No Food Insecurity (12/17/2022)  Housing: Low Risk  (12/17/2022)   Transportation Needs: No Transportation Needs (12/17/2022)  Utilities: Not At Risk (12/17/2022)  Tobacco Use: Medium Risk (12/17/2022)   SDOH Interventions:     Readmission Risk Interventions    12/18/2022    4:18 PM 04/22/2022    3:54 PM  Readmission Risk Prevention Plan  Transportation Screening Complete Complete  Medication Review (RN Care Manager) Complete Complete  PCP or Specialist appointment within 3-5 days of discharge Complete Complete  HRI or Home Care Consult Complete Complete  SW Recovery Care/Counseling Consult Complete Complete  Palliative Care Screening Not Applicable Not Applicable  Skilled Nursing Facility Not Applicable Not Applicable

## 2022-12-19 DIAGNOSIS — R0602 Shortness of breath: Secondary | ICD-10-CM

## 2022-12-19 DIAGNOSIS — J9621 Acute and chronic respiratory failure with hypoxia: Secondary | ICD-10-CM | POA: Diagnosis not present

## 2022-12-19 LAB — TSH: TSH: 0.815 u[IU]/mL (ref 0.350–4.500)

## 2022-12-19 LAB — BASIC METABOLIC PANEL
Anion gap: 8 (ref 5–15)
BUN: 31 mg/dL — ABNORMAL HIGH (ref 8–23)
CO2: 28 mmol/L (ref 22–32)
Calcium: 9.2 mg/dL (ref 8.9–10.3)
Chloride: 98 mmol/L (ref 98–111)
Creatinine, Ser: 1.79 mg/dL — ABNORMAL HIGH (ref 0.61–1.24)
GFR, Estimated: 38 mL/min — ABNORMAL LOW (ref >60)
Glucose, Bld: 109 mg/dL — ABNORMAL HIGH (ref 70–99)
Potassium: 4.7 mmol/L (ref 3.5–5.1)
Sodium: 134 mmol/L — ABNORMAL LOW (ref 135–145)

## 2022-12-19 LAB — DIGOXIN LEVEL: Digoxin Level: 1.8 ng/mL (ref 0.8–2.0)

## 2022-12-19 MED ORDER — SODIUM CHLORIDE 0.9 % IV SOLN: Freq: Once | INTRAVENOUS | Status: AC

## 2022-12-19 NOTE — Progress Notes (Signed)
Notified Dr. Joylene Igo of EKG results of Afib RVR rate 104. EKG results did not flow over into EPIC. Results of EKG placed on chart. MD acknowledged.

## 2022-12-19 NOTE — TOC Progression Note (Signed)
Transition of Care [facility]) - Progression Note    Patient Details  Name: ERCEL SEPTER MRN: [id number] Date of Birth: 07/28/1940  Transition of Care [facility]) CM/SW Contact  Truddie Hidden, RN Phone Number: 12/19/2022, 10:56 AM  Clinical Narrative:    TOC assessing for ongoing needs and discharge planning.   Expected Discharge Plan: Home w Home Health Services Barriers to Discharge: Continued Medical Work up  Expected Discharge Plan and Services                                       Date [facility] Agency Contacted: 12/18/22 Time HH Agency Contacted: 1621 Representative spoke with at [facility] Agency: Becky Sax   Social Determinants of Health (SDOH) Interventions SDOH Screenings   Food Insecurity: No Food Insecurity (12/17/2022)  Housing: Low Risk  (12/17/2022)  Transportation Needs: No Transportation Needs (12/17/2022)  Utilities: Not At Risk (12/17/2022)  Tobacco Use: Medium Risk (12/17/2022)    Readmission Risk Interventions    12/18/2022    4:18 PM 04/22/2022    3:54 PM  Readmission Risk Prevention Plan  Transportation Screening Complete Complete  Medication Review Ocean[employer]) Complete Complete  PCP or Specialist appointment within 3-5 days of discharge Complete Complete  HRI or Home Care Consult Complete Complete  SW Recovery Care/Counseling Consult Complete Complete  Palliative Care Screening Not Applicable Not Applicable  Skilled Nursing Facility Not Applicable Not Applicable

## 2022-12-19 NOTE — Progress Notes (Signed)
Progress Note   Patient: Matthew Zamora ZOX:[id number] DOB: 06-Mar-1941 DOA: 12/17/2022     2 DOS: the patient was seen and examined on 12/19/2022   Brief hospital course: Matthew Zamora is a 82 y.o. 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   Patient states that his shortness of breath started yesterday, which has been progressively worsening.  Patient has dry cough, no chest pain.  No fever or chills.  Patient is normally using 2 L oxygen, which has to be increased to 3 L, but still has severe respiratory distress, cannot speak in full sentence, initially BiPAP is started in the ED.  After treatment with nebulizer and Solu-Medrol 125 mg, his respiratory function has improved, BiPAP is discontinued and changed to 3 L oxygen with 98% of saturation. Patient does not have nausea, vomiting, diarrhea or abdominal pain.  No symptoms of UTI.  He states that he took Eliquis yesterday.   Data reviewed independently and ED Course: pt was found to have BNP 717, troponin 13 --> 16, stable renal function, temperature normal, blood pressure 134/95, heart rate 114, RR 20.  Chest x-ray showed mild vascular congestion.  Patient is admitted to PCU as inpatient.      Assessment and Plan: Acute on chronic respiratory failure with hypoxia due to acute COPD/CHF exacerbation:  Patient has SOB, cough, wheezing, indicating COPD exacerbation.  Patient has leg edema, elevated BNP 717, vascular congestion on chest x-ray, consistent with CHF exacerbation. Continue as needed and scheduled bronchodilator therapy -Discontinue IV Lasix and switch patient to oral Lasix due to worsening renal function -Continue inhaled steroids    Acute on chronic systolic CHF (congestive heart failure) ([name]): 2D echo on 11/10/2022 showed EF 30-35%,  Switch  Lasix to oral due to worsening renal function Maintain low-sodium diet Continue metoprolol       Atrial fibrillation with RVR ([name]): HR 110-120s.  Patient was given 10 mg of IV Cardizem, 25 mg of oral metoprolol in ED -Continue Eliquis -Continue home metoprolol, increased dose from 12.5 to 25 mg twice daily as well as digoxin -Remains tachycardic on the cardiac monitor despite getting a loading dose of digoxin and metoprolol Gentle IV fluid hydration   Acute on CKD (chronic kidney disease), stage IIIb:  Noted to have worsening renal function due to diuresis DC IV Lasix Monitor renal function closely Recent baseline creatinine 1.3-1.5.  Serum creatinine up to 1.8     Depression and anxiety -Continue home medications: Zoloft -As needed Ativan for anxiety   Essential hypertension -IV hydralazine as needed -Metoprolol   Hyperlipidemia -Lipitor   Normocytic anemia: Hemoglobin stable, 9.8 (9.9 on 11/15/2022) -Follow-up with CBC   Protein calorie malnutrition-moderate: Body weight 63.3 kg, BMI 22.23 -Ensure nutrition supplement -Nutrition consult     Stage II bilateral pressure injury involving both buttocks Turn frequently DuoDERM every 72 hours          Subjective: Patient is seen and examined at the bedside.  Unhappy about not being discharged today.  Remains tachycardic on the monitor.  Physical Exam: Vitals:   12/19/22 0745 12/19/22 0806 12/19/22 1120 12/19/22 1332  BP:  96/67 120/69   Pulse:  (!) 121 (!) 129   Resp:  18 18   Temp:  97.9 F (36.6 C) (!) 97 F (36.1 C)   TempSrc:  Oral  Oral   SpO2: 98% 95% 96% 97%  Weight:      Height:       General: Appears comfortable and in no distress HEENT:       Eyes: PERRL, EOMI, no jaundice       ENT: No discharge from the ears and nose, no pharynx injection, no tonsillar enlargement.        Neck: positive JVD, no bruit, no mass felt. Heme: No neck lymph node enlargement. Cardiac : Tachycardic Respiratory: has  wheezing bilaterally GI: Soft, nondistended, nontender, no rebound pain, no organomegaly, BS present. GU: No hematuria Ext: Has trace leg edema bilaterally. 1+DP/PT pulse bilaterally. Musculoskeletal: No joint deformities, No joint redness or warmth, no limitation of ROM in spin. Skin: No rashes.  Neuro: Alert, oriented X3, cranial nerves II-XII grossly intact, moves all extremities normally. Psych: Patient is not psychotic, no suicidal or hemocidal ideation.     Data Reviewed: Labs reviewed.  BUN 31, creatinine 1.79 There are no new results to review at this time.  Family Communication: Plan of care discussed with patient at the bedside  Disposition: Status is: Inpatient Remains inpatient appropriate because: Management of tachycardia  Planned Discharge Destination: Home with Home Health    Time spent: 35 minutes  Author: Lucile Shutters, MD 12/19/2022 3:14 PM  For on call review [email or web address].

## 2022-12-20 DIAGNOSIS — J9621 Acute and chronic respiratory failure with hypoxia: Secondary | ICD-10-CM | POA: Diagnosis not present

## 2022-12-20 MED ORDER — PREDNISONE 10 MG PO TABS: 20.0000 mg | ORAL_TABLET | Freq: Every day | ORAL | 0 refills | Status: AC

## 2022-12-20 MED ORDER — METOPROLOL TARTRATE 25 MG PO TABS
12.5000 mg | ORAL_TABLET | Freq: Once | ORAL | Status: AC
  Administered 2022-12-20: 12.5 mg via ORAL
  Filled 2022-12-20: qty 1

## 2022-12-20 MED ORDER — METOPROLOL TARTRATE 25 MG PO TABS: 37.5000 mg | ORAL_TABLET | Freq: Two times a day (BID) | ORAL | Status: DC

## 2022-12-20 MED ORDER — METOPROLOL TARTRATE 37.5 MG PO TABS: 37.5000 mg | ORAL_TABLET | Freq: Two times a day (BID) | ORAL | 0 refills | Status: AC

## 2022-12-20 NOTE — Progress Notes (Signed)
Pt. HR 130's when ambulating, Dr. Darrold Junker made aware. Okay to discharge, per Dr. Darrold Junker.

## 2022-12-20 NOTE — Consult Note (Signed)
[facility] CLINIC CARDIOLOGY CONSULT NOTE       Patient ID: MINAS MIZERA MRN: [id number] DOB/AGE: 1940-12-01 82 y.o.  Admit date: 12/17/2022 Referring Physician dr agbata Primary Physician Dr. Jerl Mina Primary Cardiologist Dr. Jacquelyne Balint [facility]  Reason for Consultation AF  HPI: Matthew Zamora. 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  Cardiology was consulted on 7/3 for assistance with his heart failure. Reengaged on 7/5 for assistance with atrial fibrillation   The patient is well-known to our service from multiple prior admissions, most recently just over 1 month ago for COPD exacerbation/acute on chronic HFrEF.  He was recently seen by his regular cardiologist on 6/28 where the patient was doing okay from a cardiac perspective, but was hypotensive in the office thus Lasix was discontinued and metoprolol was decreased to 12.5 mg twice daily. He presented to [facility] ED the evening of 7/2 with shortness of breath and wheezing.  He was given Solu-Medrol and DuoNebs with improvement, as well as 2 doses of IV Lasix 40 mg with brisk diuresis.  At my time of evaluation on 7/3 he was ambulating in the room with nursing assistance and says he felt "great."  He denies chest pain, shortness of breath, heart racing or palpitations.  No orthopnea, PND, or lower extremity edema.  He "feels like he is dehydrated" and points out areas of wrinkling and dimpling on the skin of his chest and arms.  Cardiology was reengaged today for assistance with the patient's atrial fibrillation.  His metoprolol tartrate was increased to 25 mg twice daily and was loaded with digoxin IV (level therapeutic on 7/4  at 1.8).  Historically the patient was on amiodarone but recently discontinued due to elevated LFTs. EKG obtained 7/4 shows atrial fibrillation with controlled ventricular response with rate of 104bpm. Average HR on tele in the mid 90s to low 100s with short paroxysms to the 120s in atrial fibrillation. At my time of evaluation today the patient is sitting up in a recliner on room air, denies chest pain, heart racing or palpitations, or shortness of breath. He is eager to to go home.    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 pylori infection    History of hepatitis    Hyperlipidemia    Hypertension    Liver cyst 05/16/15   PAF (paroxysmal atrial fibrillation) ([name])    Prostate CA [facility])     Past Surgical History:  Procedure Laterality Date   CATARACT EXTRACTION     COLONOSCOPY  10/06/2014, 09/18/2004, 03/14/2009   ESOPHAGOGASTRODUODENOSCOPY  10/06/2014, 03/18/2001, 03/14/2009   ESOPHAGOGASTRODUODENOSCOPY (EGD) WITH PROPOFOL N/A 05/07/2018   Procedure: ESOPHAGOGASTRODUODENOSCOPY (EGD) WITH PROPOFOL;  Surgeon: Toledo, Boykin Nearing, MD;  Location: [facility] ENDOSCOPY;  Service: Gastroenterology;  Laterality: N/A;   ESOPHAGOGASTRODUODENOSCOPY (EGD) WITH PROPOFOL N/A 04/22/2022   Procedure: ESOPHAGOGASTRODUODENOSCOPY (  EGD) WITH PROPOFOL;  Surgeon: Toney Reil, MD;  Location: [facility] ENDOSCOPY;  Service: Endoscopy;  Laterality: N/A;   FLEXIBLE SIGMOIDOSCOPY  08/26/1990   HIP ARTHROPLASTY Right 10/31/2022   Procedure: ARTHROPLASTY BIPOLAR HIP (HEMIARTHROPLASTY)-RNFA;  Surgeon: Reinaldo Berber, MD;  Location: [facility] ORS;  Service: Orthopedics;  Laterality: Right;    INSERTION OF ICD     PROSTATE SURGERY     TONSILLECTOMY      Medications Prior to Admission  Medication Sig Dispense Refill Last Dose   acetaminophen (TYLENOL) 325 MG tablet Take 2 tablets (650 mg total) by mouth every 4 (four) hours as needed for headache or mild pain.   Past Week   albuterol (PROVENTIL) (2.5 MG/3ML) 0.083% nebulizer solution Inhale 3 mLs (2.5 mg total) into the lungs as needed for shortness of breath. 75 mL 12 12/16/2022   ascorbic acid (VITAMIN C) 500 MG tablet Take 1 tablet by mouth daily.   12/16/2022   atorvastatin (LIPITOR) 80 MG tablet Take 80 mg by mouth daily.   12/15/2022   calcium carbonate (TUMS EX) 750 MG chewable tablet Chew 1 tablet by mouth daily.   Past Week   ELIQUIS 2.5 MG TABS tablet Take 2.5 mg by mouth 2 (two) times daily.   12/16/2022 at am   fluticasone-salmeterol (ADVAIR) 500-50 MCG/ACT AEPB Inhale 1 puff into the lungs in the morning and at bedtime.   12/16/2022   guaiFENesin-dextromethorphan (ROBITUSSIN DM) 100-10 MG/5ML syrup Take 5 mLs by mouth every 4 (four) hours as needed for cough. 118 mL 0 prn at unk   magnesium oxide (MAG-OX) 400 (240 Mg) MG tablet Take 1 tablet by mouth daily.   12/16/2022   melatonin 3 MG TABS tablet Take 9 mg by mouth at bedtime.   12/16/2022   metoCLOPramide (REGLAN) 5 MG tablet Take 1 tablet (5 mg total) by mouth every 8 (eight) hours as needed for refractory nausea / vomiting.   12/16/2022   metoprolol succinate (TOPROL-XL) 25 MG 24 hr tablet Take 12.5 mg by mouth 2 (two) times daily.   12/16/2022   midodrine (PROAMATINE) 5 MG tablet Take 1 tablet (5 mg total) by mouth 3 (three) times daily with meals. (Patient taking differently: Take 30 mg by mouth 3 (three) times daily with meals.) 90 tablet 1 12/16/2022 at 1200   Multiple Vitamin (MULTIVITAMIN WITH MINERALS) TABS tablet Take 1 tablet by mouth daily.   12/16/2022   ondansetron (ZOFRAN-ODT) 4 MG disintegrating tablet Take 4 mg by mouth every 8 (eight) hours as needed for refractory nausea /  vomiting, vomiting or nausea.   Past Week   pantoprazole (PROTONIX) 40 MG tablet Take 1 tablet (40 mg total) by mouth 2 (two) times daily.   12/16/2022   polyethylene glycol (MIRALAX / GLYCOLAX) 17 g packet Take 17 g by mouth 2 (two) times daily. 14 each 0 Past Week   pyridostigmine (MESTINON) 60 MG tablet Take 1 tablet (60 mg total) by mouth every 8 (eight) hours.   12/16/2022   sertraline (ZOLOFT) 25 MG tablet Take 25 mg by mouth daily.   12/16/2022   sucralfate (CARAFATE) 1 GM/10ML suspension Take 10 mLs (1 g total) by mouth 4 (four) times daily -  with meals and at bedtime. 420 mL 0 12/16/2022   tamsulosin (FLOMAX) 0.4 MG CAPS capsule Take 0.4 mg by mouth daily.   12/15/2022   furosemide (LASIX) 20 MG tablet Take 1 tablet (20 mg total) by mouth daily. (Patient not taking: Reported on 12/17/2022) 30  tablet 0 Not Taking   gabapentin (NEURONTIN) 100 MG capsule Take 100 mg by mouth at bedtime.   12/15/2022   HYDROcodone-acetaminophen (NORCO/VICODIN) 5-325 MG tablet Take 1-2 tablets by mouth every 4 (four) hours as needed for moderate pain (pain score 4-6). (Patient not taking: Reported on 12/17/2022) 30 tablet 0 Completed Course   metoprolol tartrate (LOPRESSOR) 25 MG tablet Take 1 tablet (25 mg total) by mouth 2 (two) times daily. (Patient not taking: Reported on 12/17/2022) 60 tablet 1 Not Taking   Nebulizer MISC 1 each by Does not apply route as needed. 1 each 0    Nutritional Supplements (,FEEDING SUPPLEMENT, PROSOURCE PLUS) liquid Take 30 mLs by mouth 3 (three) times daily between meals.      Social History   Socioeconomic History   Marital status: Single    Spouse name: Not on file   Number of children: Not on file   Years of education: Not on file   Highest education level: Not on file  Occupational History   Not on file  Tobacco Use   Smoking status: Former    Packs/day: 1.00    Years: 38.00    Additional pack years: 0.00    Total pack years: 38.00    Types: Cigarettes    Quit date: 06/18/1999     Years since quitting: 23.5   Smokeless tobacco: Never  Vaping Use   Vaping Use: Never used  Substance and Sexual Activity   Alcohol use: No    Alcohol/week: 0.0 standard drinks of alcohol   Drug use: Not Currently   Sexual activity: Not Currently  Other Topics Concern   Not on file  Social History Narrative   Not on file   Social Determinants of Health   Financial Resource Strain: Not on file  Food Insecurity: No Food Insecurity (12/17/2022)   Hunger Vital Sign    Worried About Running Out of Food in the Last Year: Never true    Ran Out of Food in the Last Year: Never true  Transportation Needs: No Transportation Needs (12/17/2022)   PRAPARE - Administrator, Civil Service (Medical): No    Lack of Transportation (Non-Medical): No  Physical Activity: Not on file  Stress: Not on file  Social Connections: Not on file  Intimate Partner Violence: Not At Risk (12/17/2022)   Humiliation, Afraid, Rape, and Kick questionnaire    Fear of Current or Ex-Partner: No    Emotionally Abused: No    Physically Abused: No    Sexually Abused: No    Family History  Problem Relation Age of Onset   Heart attack Mother    Heart attack Father       Intake/Output Summary (Last 24 hours) at 12/20/2022 1116 Last data filed at 12/20/2022 1035 Gross per 24 hour  Intake 960 ml  Output 1175 ml  Net -215 ml     Vitals:   12/20/22 0406 12/20/22 0809 12/20/22 0829 12/20/22 0840  BP: 115/83  119/70   Pulse: (!) 106  (!) 117 (!) 108  Resp: 18  18   Temp: 97.9 F (36.6 C)  97.7 F (36.5 C)   TempSrc:      SpO2: 100% 100% 100%   Weight:  59.5 kg    Height:        PHYSICAL EXAM General: Pleasant thin elderly Caucasian male,  in no acute distress. Sitting upright in recliner  HEENT:  Normocephalic and atraumatic. Neck:  No JVD.  Lungs: Normal  respiratory effort on room air.  Decreased breath sounds with trace right-sided crackles without appreciable wheezes.   Heart: irregularly  irregular with controlled rate. Normal S1 and S2 without gallops or murmurs.  Abdomen: Non-distended appearing.  Msk: Normal strength and tone for age. Extremities: Warm and well perfused. No clubbing, cyanosis.  No peripheral edema.  Neuro: Alert and oriented X 3. Psych:  Answers questions appropriately.   Labs: Basic Metabolic Panel: Recent Labs    12/18/22 0437 12/19/22 0310  NA 133* 134*  K 4.5 4.7  CL 96* 98  CO2 27 28  GLUCOSE 119* 109*  BUN 32* 31*  CREATININE 1.86* 1.79*  CALCIUM 9.2 9.2  MG 2.2  --     Liver Function Tests: No results for input(s): "AST", "ALT", "ALKPHOS", "BILITOT", "PROT", "ALBUMIN" in the last 72 hours.  No results for input(s): "LIPASE", "AMYLASE" in the last 72 hours. CBC: Recent Labs    12/18/22 0437  WBC 10.3  HGB 9.4*  HCT 28.1*  MCV 90.9  PLT 199    Cardiac Enzymes: No results for input(s): "CKTOTAL", "CKMB", "CKMBINDEX", "TROPONINIHS" in the last 72 hours.  BNP: Recent Labs    12/18/22 1531  BNP 448.3*    D-Dimer: No results for input(s): "DDIMER" in the last 72 hours. Hemoglobin A1C: No results for input(s): "HGBA1C" in the last 72 hours. Fasting Lipid Panel: No results for input(s): "CHOL", "HDL", "LDLCALC", "TRIG", "CHOLHDL", "LDLDIRECT" in the last 72 hours. Thyroid Function Tests: Recent Labs    12/19/22 1613  TSH 0.815   Anemia Panel: No results for input(s): "VITAMINB12", "FOLATE", "FERRITIN", "TIBC", "IRON", "RETICCTPCT" in the last 72 hours.   Radiology: DG Chest Portable 1 View  Result Date: 12/17/2022 CLINICAL DATA:  Shortness of breath EXAM: PORTABLE CHEST 1 VIEW COMPARISON:  11/10/2022 FINDINGS: Cardiac shadow is stable. Defibrillator is again noted. Lungs are well aerated without focal infiltrate. Mild vascular congestion is seen. IMPRESSION: Mild vascular congestion.  No focal infiltrate is noted. Electronically Signed   By: Alcide Clever M.D.   On: 12/17/2022 02:43    TELEMETRY reviewed by me (LT)  12/20/2022: AF rate 90s-110s with occasional V pacing   EKG reviewed by me: Sinus tachycardia rate 113  Data reviewed by me (LT) 12/20/2022: hospitalist progress note last 24h vitals tele labs imaging I/O   Principal Problem:   Acute on chronic respiratory failure with hypoxia ([name]) Active Problems:   Essential hypertension   Hyperlipidemia   Atrial fibrillation with RVR ([name])   Acute on chronic systolic CHF (congestive heart failure) ([name])   CKD (chronic kidney disease), stage IIIb   COPD exacerbation ([name])   Normocytic anemia   Depression   Protein-calorie malnutrition, moderate ([name])    ASSESSMENT AND PLAN:  Matthew Zamora. 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  Cardiology is consulted on hospital day 2 for assistance with his heart failure.  # COPD with exacerbation  # chronic respiratory failure (2L)  - recommend discontinuation of Xopenex  # paroxysmal AF RVR  # hx VT  Converted from NSR to atrial fibrillation on 7/4, although rate predominantly controlled in the 90s to 110s with short paroxysms to the 120s with activity.  The patient is asymptomatic and denies palpitations or lightheadedness.  Aggressive rate and rhythm control in this patient are limited by multiple drug intolerances and baseline hypotension.   -Continue metoprolol for rate control and Eliquis for stroke prevention  -Continue routine follow-up with [facility] EP at discharge  # acute on chronic HFrEF  # S/p primary prevention ICD Clinically euvolemic on exam with only trace right basilar crackles without peripheral edema or other heart failure symptoms.  Diuresed briskly with 40 mg of IV Lasix x 2 on  hospital day 1. - continue home midodrine 5mg  TID -Hold further IV diuresis consider as needed Lasix 20 mg daily for peripheral edema, weight gain - GDMT additions of ARB/ARNI & SGLT2i limited with BP and CKD. Intolerant of spiro.  -Defer additional cardiac diagnostics at this time   # CKD 3  Stable after initial IV diuresis.  Cardiology will sign off. Please haiku with questions or re-engage if needed.    This patient's plan of care was discussed and created with Dr. Darrold Junker and he is in agreement.  Signed: Rebeca Allegra , PA-C 12/20/2022, 11:16 AM [facility] Cardiology

## 2022-12-20 NOTE — Evaluation (Signed)
Physical Therapy Evaluation Patient Details Name: Matthew Zamora MRN: [id number] DOB: [birth year] Today's Date: 12/20/2022  History of Present Illness  presented to ER secondary to progressive SOB; admitted for management of acute/chronic respiratory failure with hypoxia due to COPD/CHF exacerbation.  Hospital course also complicated by afib with RVR, managed medically  Clinical Impression  Patient resting in bed upon arrival to room, physician completing daily rounds. Patient alert and oriented, follows commands and eager for OOB activities (with progression towards DC home).  Denies pain at this time; does endorse noted improvement in respiratory status since admission.  Bilat UE/LE strength and ROM grossly symmetrical and WFL for basic transfers and gait; no focal weakness appreciated. Able to complete bed mobility with mod indep; sit/stand, basic transfers and gait (20' x1, 50' x1) with RW, cga/min assist.  Demonstrates reciprocal stepping pattern with mild forward trunk flexion; fair cadence/gait speed; good walker position/management. Mild SOB with exertion; sats >97% on RA, HR 120-130s with activity.  RN/MD informed/aware; additional distance deferred as a result. Per patient, does not utilize continuous O2 at home; tolerated session on RA without difficulty, maintaining sats >97% on RA.  RN informed/aware. Would benefit from skilled PT to address above deficits and promote optimal return to PLOF.; recommend post-acute PT follow up as indicated by interdisciplinary care team.          Assistance Recommended at Discharge PRN  If plan is discharge home, recommend the following:  Can travel by private vehicle  A little help with walking and/or transfers;A little help with bathing/dressing/bathroom        Equipment Recommendations  (has necessary equipment)  Recommendations for Other Services       Functional Status Assessment Patient has had a recent decline in their functional  status and demonstrates the ability to make significant improvements in function in a reasonable and predictable amount of time.     Precautions / Restrictions Precautions Precautions: Fall Restrictions Weight Bearing Restrictions: No      Mobility  Bed Mobility Overal bed mobility: Modified Independent                  Transfers Overall transfer level: Needs assistance Equipment used: Rolling walker (2 wheels) Transfers: Sit to/from Stand Sit to Stand: Min guard                Ambulation/Gait Ambulation/Gait assistance: Min guard Gait Distance (Feet):  (20' x1, 50' x1) Assistive device: Rolling walker (2 wheels)         General Gait Details: reciprocal stepping pattern with mild forward trunk flexion; fair cadence/gait speed; good walker position/management.  Mild SOB with exertion; sats >97% on RA, HR 120-130s with activity  Stairs            Wheelchair Mobility     Tilt Bed    Modified Rankin (Stroke Patients Only)       Balance Overall balance assessment: Needs assistance Sitting-balance support: No upper extremity supported, Feet supported Sitting balance-Leahy Scale: Good     Standing balance support: Bilateral upper extremity supported Standing balance-Leahy Scale: Fair                               Pertinent Vitals/Pain Pain Assessment Pain Assessment: No/denies pain    Home Living Family/patient expects to be discharged to:: Private residence Living Arrangements: Alone Available Help at Discharge: Family;Available PRN/intermittently Type of Home: Mobile home Home Access: Ramped entrance  Home Layout: One level Home Equipment: Rollator (4 wheels);Rolling Walker (2 wheels);Cane - single point;Wheelchair - manual;Toilet riser Additional Comments: DIL manages medications, provides transportation and check on patient 2x/day    Prior Function Prior Level of Function : Needs assist;History of Falls (last six  months)             Mobility Comments: room to room distances in his mobile home, intermittent RW use; recurrent falls in previous six months (with hip fracture/repair in May, 2024) ADLs Comments: pt reports DIL assists with med management, meals; transportation; reports indep taking sink baths     Hand Dominance   Dominant Hand: Right    Extremity/Trunk Assessment   Upper Extremity Assessment Upper Extremity Assessment: Overall WFL for tasks assessed    Lower Extremity Assessment Lower Extremity Assessment: Generalized weakness (grossly at least 4-/5 throughout)       Communication   Communication: No difficulties  Cognition Arousal/Alertness: Awake/alert Behavior During Therapy: WFL for tasks assessed/performed Overall Cognitive Status: Within Functional Limits for tasks assessed                                          General Comments      Exercises     Assessment/Plan    PT Assessment Patient needs continued PT services  PT Problem List Decreased strength;Decreased range of motion;Decreased activity tolerance;Decreased balance;Decreased mobility;Decreased knowledge of use of DME;Decreased safety awareness;Decreased knowledge of precautions       PT Treatment Interventions DME instruction;Gait training;Stair training;Functional mobility training;Therapeutic activities;Therapeutic exercise;Balance training;Patient/family education    PT Goals (Current goals can be found in the Care Plan section)  Acute Rehab PT Goals Patient Stated Goal: to return home PT Goal Formulation: With patient Time For Goal Achievement: 01/03/23 Potential to Achieve Goals: Good    Frequency Min 3X/week     Co-evaluation               AM-PAC PT "6 Clicks" Mobility  Outcome Measure Help needed turning from your back to your side while in a flat bed without using bedrails?: None Help needed moving from lying on your back to sitting on the side of a flat  bed without using bedrails?: A Little Help needed moving to and from a bed to a chair (including a wheelchair)?: A Little Help needed standing up from a chair using your arms (e.g., wheelchair or bedside chair)?: A Little Help needed to walk in hospital room?: A Little Help needed climbing 3-5 steps with a railing? : A Little 6 Click Score: 19    End of Session Equipment Utilized During Treatment: Gait belt Activity Tolerance: Patient tolerated treatment well Patient left: in chair;with call bell/phone within reach;with chair alarm set Nurse Communication: Mobility status PT Visit Diagnosis: Muscle weakness (generalized) (M62.81);Difficulty in walking, not elsewhere classified (R26.2)    Time: 5643-3295 PT Time Calculation (min) (ACUTE ONLY): 25 min   Charges:   PT Evaluation $PT Eval Moderate Complexity: 1 Mod   PT General Charges $$ ACUTE PT VISIT: 1 Visit         Patrice Matthew H. Manson Passey, PT, DPT, NCS 12/20/22, 2:21 PM [phone]

## 2022-12-20 NOTE — TOC Transition Note (Addendum)
Transition of Care [facility]) - CM/SW Discharge Note   Patient Details  Name: Matthew Zamora MRN: [id number] Date of Birth: 12/28/1940  [facility] CM/SW Contact:  Truddie Hidden, RN Phone Number: 12/20/2022, 3:20 PM   Clinical Narrative:    Spoke with patient about his discharge plan  Patient's friend will transport him home today.  Spoke with fmaily member [name] to advised of discharge today. She stated she was in [facility].  He was advised HH will contact him regarding his resumption of care.  Cheryl from [facility] notified of patient's discharge TOC signing off.      Barriers to Discharge: Continued Medical Work up   Patient Goals and CMS Choice      Discharge Placement                         Discharge Plan and Services Additional resources added to the After Visit Summary for                                Date [facility] Agency Contacted: 12/18/22 Time HH Agency Contacted: 1621 Representative spoke with at [facility] Agency: Becky Sax  Social Determinants of Health (SDOH) Interventions SDOH Screenings   Food Insecurity: No Food Insecurity (12/17/2022)  Housing: Low Risk  (12/17/2022)  Transportation Needs: No Transportation Needs (12/17/2022)  Utilities: Not At Risk (12/17/2022)  Tobacco Use: Medium Risk (12/17/2022)     Readmission Risk Interventions    12/18/2022    4:18 PM 04/22/2022    3:54 PM  Readmission Risk Prevention Plan  Transportation Screening Complete Complete  Medication Review [employer]) Complete Complete  PCP or Specialist appointment within 3-5 days of discharge Complete Complete  HRI or Home Care Consult Complete Complete  SW Recovery Care/Counseling Consult Complete Complete  Palliative Care Screening Not Applicable Not Applicable  Skilled Nursing Facility Not Applicable Not Applicable

## 2022-12-20 NOTE — Discharge Summary (Signed)
Physician Discharge Summary   Patient: Matthew Zamora MRN: [id number] DOB: 1940/08/31  Admit date:     12/17/2022  Discharge date: 12/20/22  Discharge Physician: Isa Hitz   PCP: Jerl Mina, MD   Recommendations at discharge:   Take medications as recommended Keep scheduled appointment with cardiologist  Discharge Diagnoses: Principal Problem:   Acute on chronic respiratory failure with hypoxia ([name]) Active Problems:   Acute on chronic systolic CHF (congestive heart failure) ([name])   COPD exacerbation ([name])   Atrial fibrillation with RVR ([name])   CKD (chronic kidney disease), stage IIIb   Depression   Essential hypertension   Hyperlipidemia   Normocytic anemia   Protein-calorie malnutrition, moderate ([name])  Resolved Problems:   * No resolved hospital problems. *  Hospital Course: RYKIN Zamora is a 82 y.o. 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   Patient states that his shortness of breath started 1 day prior to his admission, which has been progressively worsening.  Patient has dry cough, no chest pain.  No fever or chills.  Patient is normally using 2 L oxygen, which has to be increased to 3 L, but still has severe respiratory distress, cannot speak in full sentence, initially BiPAP is started in the ED.  After treatment with nebulizer and Solu-Medrol 125 mg, his respiratory function has improved, BiPAP is discontinued and changed to 3 L oxygen with 98% of saturation. Patient does not have nausea, vomiting, diarrhea or abdominal pain.  No symptoms of UTI.  He states that he took Eliquis yesterday.   Data reviewed independently and ED Course: pt was found to have BNP 717, troponin 13 --> 16, stable renal function, temperature normal, blood pressure 134/95, heart rate 114, RR 20.   Chest x-ray showed mild vascular congestion.  Patient is admitted to PCU as inpatient.     EKG: I have personally reviewed.  Seems to be A-fib, QTc 464, heart rate 121, right axis deviation, early R wave progression        Assessment and Plan:  Acute on chronic respiratory failure with hypoxia due to acute COPD/CHF exacerbation:  Patient has SOB, cough, wheezing, indicating COPD exacerbation.  Patient has leg edema, elevated BNP 717, vascular congestion on chest x-ray, consistent with CHF exacerbation. 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  Patient was given 10 mg of IV Cardizem, 25 mg of oral metoprolol in ED -Continue Eliquis -Continue home metoprolol, increased dose from 25 mg to 37.5 mg twice daily -TSH is within normal limits   Acute on CKD (chronic kidney disease), stage IIIb:  Noted to have worsening renal function due to diuresis DC IV Lasix Monitor renal function closely Recent baseline creatinine 1.3-1.5.  Serum creatinine up to 1.8     Depression and anxiety -Continue home medications: Zoloft -As needed Ativan for anxiety   Essential hypertension -IV hydralazine as needed -Metoprolol   Hyperlipidemia -Lipitor   Normocytic anemia: Hemoglobin stable, 9.8 (9.9 on  11/15/2022) -Follow-up with CBC   Protein calorie malnutrition-moderate: Body weight 63.3 kg, BMI 22.23 -Liberalize diet to 2 gram sodium for wider variety of meal selections -MVI with minerals daily -30 ml Prosource Plus BID, each supplement provides 100 kcals and 15 grams protein -Magic cup TID with meals,  each supplement provides 290 kcal and 9 grams of protein    Stage II bilateral pressure injury involving both buttocks Turn frequently DuoDERM every 72 hours         Consultants: Cardiology Procedures performed: None Disposition: Home health Diet recommendation:  Discharge Diet Orders (From admission, onward)     Start     Ordered   12/20/22 0000  Diet - low sodium heart healthy        12/20/22 1404           Cardiac diet DISCHARGE MEDICATION: Allergies as of 12/20/2022       Reactions   Nitrofuran Derivatives Other (See Comments)   Transaminitis ** confounded w/Amiodarone, Mexiletine   Spironolactone    Significant transaminitis         Medication List     STOP taking these medications    furosemide 20 MG tablet Commonly known as: LASIX   HYDROcodone-acetaminophen 5-325 MG tablet Commonly known as: NORCO/VICODIN   metoprolol succinate 25 MG 24 hr tablet Commonly known as: TOPROL-XL       TAKE these medications    (feeding supplement) PROSource Plus liquid Take 30 mLs by mouth 3 (three) times daily between meals.   acetaminophen 325 MG tablet Commonly known as: TYLENOL Take 2 tablets (650 mg total) by mouth every 4 (four) hours as needed for headache or mild pain.   albuterol (2.5 MG/3ML) 0.083% nebulizer solution Commonly known as: PROVENTIL Inhale 3 mLs (2.5 mg total) into the lungs as needed for shortness of breath.   ascorbic acid 500 MG tablet Commonly known as: VITAMIN C Take 1 tablet by mouth daily.   atorvastatin 80 MG tablet Commonly known as: LIPITOR Take 80 mg by mouth daily.   calcium carbonate 750 MG chewable tablet Commonly known as: TUMS EX Chew 1 tablet by mouth daily.   Eliquis 2.5 MG Tabs tablet Generic drug: apixaban Take 2.5 mg by mouth 2 (two) times daily.   fluticasone-salmeterol 500-50 MCG/ACT Aepb Commonly known as: ADVAIR Inhale 1 puff into the lungs in the morning and at bedtime.   gabapentin 100 MG  capsule Commonly known as: NEURONTIN Take 100 mg by mouth at bedtime.   guaiFENesin-dextromethorphan 100-10 MG/5ML syrup Commonly known as: ROBITUSSIN DM Take 5 mLs by mouth every 4 (four) hours as needed for cough.   magnesium oxide 400 (240 Mg) MG tablet Commonly known as: MAG-OX Take 1 tablet by mouth daily.   melatonin 3 MG Tabs tablet Take 9 mg by mouth at bedtime.   metoCLOPramide 5 MG tablet Commonly known as: REGLAN Take 1 tablet (5 mg total) by mouth every 8 (eight) hours as needed for refractory nausea / vomiting.   Metoprolol Tartrate 37.5 MG Tabs Take 1 tablet (37.5 mg total) by mouth 2 (two) times daily. What changed:  medication strength how much to take   midodrine 5 MG tablet Commonly known as: PROAMATINE Take 1 tablet (5 mg total) by mouth 3 (three) times daily with meals. What changed: how much to take   multivitamin with minerals Tabs tablet Take 1 tablet by mouth daily.   Nebulizer Misc 1 each by Does not apply route as needed.  ondansetron 4 MG disintegrating tablet Commonly known as: ZOFRAN-ODT Take 4 mg by mouth every 8 (eight) hours as needed for refractory nausea / vomiting, vomiting or nausea.   pantoprazole 40 MG tablet Commonly known as: PROTONIX Take 1 tablet (40 mg total) by mouth 2 (two) times daily.   polyethylene glycol 17 g packet Commonly known as: MIRALAX / GLYCOLAX Take 17 g by mouth 2 (two) times daily.   pyridostigmine 60 MG tablet Commonly known as: MESTINON Take 1 tablet (60 mg total) by mouth every 8 (eight) hours.   sertraline 25 MG tablet Commonly known as: ZOLOFT Take 25 mg by mouth daily.   sucralfate 1 GM/10ML suspension Commonly known as: CARAFATE Take 10 mLs (1 g total) by mouth 4 (four) times daily -  with meals and at bedtime.   tamsulosin 0.4 MG Caps capsule Commonly known as: FLOMAX Take 0.4 mg by mouth daily.        Discharge Exam: Filed Weights   12/17/22 2220 12/19/22 0713 12/20/22 0809   Weight: 66 kg 60.6 kg 59.5 kg    General: Appears comfortable and in no distress HEENT:       Eyes: PERRL, EOMI, no jaundice       ENT: No discharge from the ears and nose, no pharynx injection, no tonsillar enlargement.        Neck: positive JVD, no bruit, no mass felt. Heme: No neck lymph node enlargement. Cardiac : Tachycardic Respiratory: has wheezing bilaterally GI: Soft, nondistended, nontender, no rebound pain, no organomegaly, BS present. GU: No hematuria Ext: Has trace leg edema bilaterally. 1+DP/PT pulse bilaterally. Musculoskeletal: No joint deformities, No joint redness or warmth, no limitation of ROM in spin. Skin: No rashes.  Neuro: Alert, oriented X3, cranial nerves II-XII grossly intact, moves all extremities normally. Psych: Patient is not psychotic, no suicidal or hemocidal ideation.    Condition at discharge: stable  The results of significant diagnostics from this hospitalization (including imaging, microbiology, ancillary and laboratory) are listed below for reference.   Imaging Studies: DG Chest Portable 1 View  Result Date: 12/17/2022 CLINICAL DATA:  Shortness of breath EXAM: PORTABLE CHEST 1 VIEW COMPARISON:  11/10/2022 FINDINGS: Cardiac shadow is stable. Defibrillator is again noted. Lungs are well aerated without focal infiltrate. Mild vascular congestion is seen. IMPRESSION: Mild vascular congestion.  No focal infiltrate is noted. Electronically Signed   By: Alcide Clever M.D.   On: 12/17/2022 02:43    Microbiology: Results for orders placed or performed during the hospital encounter of 12/17/22  SARS Coronavirus 2 by RT PCR (hospital order, performed in [facility] hospital lab) *cepheid single result test* Nasopharyngeal Swab     Status: None   Collection Time: 12/17/22 10:29 AM   Specimen: Nasopharyngeal Swab; Nasal Swab  Result Value Ref Range Status   SARS Coronavirus 2 by RT PCR NEGATIVE NEGATIVE Final    Comment: (NOTE) SARS-CoV-2 target nucleic  acids are NOT DETECTED.  The SARS-CoV-2 RNA is generally detectable in upper and lower respiratory specimens during the acute phase of infection. The lowest concentration of SARS-CoV-2 viral copies this assay can detect is 250 copies / mL. A negative result does not preclude SARS-CoV-2 infection and should not be used as the sole basis for treatment or other patient management decisions.  A negative result may occur with improper specimen collection / handling, submission of specimen other than nasopharyngeal swab, presence of viral mutation(s) within the areas targeted by this assay, and inadequate number of viral copies (<  250 copies / mL). A negative result must be combined with clinical observations, patient history, and epidemiological information.  Fact Sheet for Patients:   RoadLapTop.co.za  Fact Sheet for Healthcare Providers: http://kim-miller.com/  This test is not yet approved or  cleared by the [place] FDA and has been authorized for detection and/or diagnosis of SARS-CoV-2 by FDA under an Emergency Use Authorization (EUA).  This EUA will remain in effect (meaning this test can be used) for the duration of the COVID-19 declaration under Section 564(b)(1) of the Act, 21 U.S.C. section 360bbb-3(b)(1), unless the authorization is terminated or revoked sooner.  Performed at [facility], [address] [place], [place] [address]   Respiratory (~20 pathogens) panel by PCR     Status: None   Collection Time: 12/17/22 10:50 AM  Result Value Ref Range Status   Adenovirus NOT DETECTED NOT DETECTED Final   Coronavirus 229E NOT DETECTED NOT DETECTED Final    Comment: (NOTE) The Coronavirus on the Respiratory Panel, DOES NOT test for the novel  Coronavirus (2019 nCoV)    Coronavirus HKU1 NOT DETECTED NOT DETECTED Final   Coronavirus NL63 NOT DETECTED NOT DETECTED Final   Coronavirus OC43 NOT DETECTED NOT DETECTED  Final   Metapneumovirus NOT DETECTED NOT DETECTED Final   Rhinovirus / Enterovirus NOT DETECTED NOT DETECTED Final   Influenza A NOT DETECTED NOT DETECTED Final   Influenza B NOT DETECTED NOT DETECTED Final   Parainfluenza Virus 1 NOT DETECTED NOT DETECTED Final   Parainfluenza Virus 2 NOT DETECTED NOT DETECTED Final   Parainfluenza Virus 3 NOT DETECTED NOT DETECTED Final   Parainfluenza Virus 4 NOT DETECTED NOT DETECTED Final   Respiratory Syncytial Virus NOT DETECTED NOT DETECTED Final   Bordetella pertussis NOT DETECTED NOT DETECTED Final   Bordetella Parapertussis NOT DETECTED NOT DETECTED Final   Chlamydophila pneumoniae NOT DETECTED NOT DETECTED Final   Mycoplasma pneumoniae NOT DETECTED NOT DETECTED Final    Comment: Performed at [facility], [address], [place], [place] [address]    Labs: CBC: Recent Labs  Lab 12/17/22 0202 12/18/22 0437  WBC 9.6 10.3  HGB 9.8* 9.4*  HCT 32.1* 28.1*  MCV 96.7 90.9  PLT 196 199   Basic Metabolic Panel: Recent Labs  Lab 12/17/22 0202 12/18/22 0437 12/19/22 0310  NA 137 133* 134*  K 4.3 4.5 4.7  CL 105 96* 98  CO2 23 27 28   GLUCOSE 113* 119* 109*  BUN 20 32* 31*  CREATININE 1.39* 1.86* 1.79*  CALCIUM 8.7* 9.2 9.2  MG  --  2.2  --    Liver Function Tests: Recent Labs  Lab 12/17/22 0202  AST 22  ALT 12  ALKPHOS 89  BILITOT 0.8  PROT 6.2*  ALBUMIN 3.5   CBG: No results for input(s): "GLUCAP" in the last 168 hours.  Discharge time spent: greater than 30 minutes.  Signed: Lucile Shutters, MD [facility] 12/20/2022

## 2022-12-27 ENCOUNTER — Other Ambulatory Visit: Payer: Self-pay

## 2022-12-27 ENCOUNTER — Emergency Department: Payer: Medicare Other

## 2022-12-27 ENCOUNTER — Inpatient Hospital Stay
Admission: EM | Admit: 2022-12-27 | Discharge: 2022-12-29 | DRG: 871 | Disposition: A | Discharge status: Other Acute Inpatient Hospital | Payer: Medicare Other | Attending: Internal Medicine | Admitting: Internal Medicine

## 2022-12-27 DIAGNOSIS — I6522 Occlusion and stenosis of left carotid artery: Secondary | ICD-10-CM | POA: Diagnosis not present

## 2022-12-27 DIAGNOSIS — B348 Other viral infections of unspecified site: Secondary | ICD-10-CM | POA: Diagnosis not present

## 2022-12-27 DIAGNOSIS — B9789 Other viral agents as the cause of diseases classified elsewhere: Secondary | ICD-10-CM | POA: Diagnosis present

## 2022-12-27 DIAGNOSIS — J9621 Acute and chronic respiratory failure with hypoxia: Secondary | ICD-10-CM | POA: Diagnosis present

## 2022-12-27 DIAGNOSIS — G7000 Myasthenia gravis without (acute) exacerbation: Secondary | ICD-10-CM | POA: Diagnosis present

## 2022-12-27 DIAGNOSIS — Z86010 Personal history of colonic polyps: Secondary | ICD-10-CM

## 2022-12-27 DIAGNOSIS — Z66 Do not resuscitate: Secondary | ICD-10-CM | POA: Diagnosis not present

## 2022-12-27 DIAGNOSIS — G4700 Insomnia, unspecified: Secondary | ICD-10-CM | POA: Diagnosis present

## 2022-12-27 DIAGNOSIS — I959 Hypotension, unspecified: Secondary | ICD-10-CM | POA: Diagnosis not present

## 2022-12-27 DIAGNOSIS — I5021 Acute systolic (congestive) heart failure: Secondary | ICD-10-CM | POA: Diagnosis not present

## 2022-12-27 DIAGNOSIS — I509 Heart failure, unspecified: Secondary | ICD-10-CM | POA: Diagnosis not present

## 2022-12-27 DIAGNOSIS — F419 Anxiety disorder, unspecified: Secondary | ICD-10-CM | POA: Diagnosis present

## 2022-12-27 DIAGNOSIS — R410 Disorientation, unspecified: Secondary | ICD-10-CM | POA: Diagnosis present

## 2022-12-27 DIAGNOSIS — I5022 Chronic systolic (congestive) heart failure: Secondary | ICD-10-CM | POA: Diagnosis not present

## 2022-12-27 DIAGNOSIS — I130 Hypertensive heart and chronic kidney disease with heart failure and stage 1 through stage 4 chronic kidney disease, or unspecified chronic kidney disease: Secondary | ICD-10-CM | POA: Diagnosis present

## 2022-12-27 DIAGNOSIS — B9562 Methicillin resistant Staphylococcus aureus infection as the cause of diseases classified elsewhere: Secondary | ICD-10-CM | POA: Diagnosis not present

## 2022-12-27 DIAGNOSIS — R4701 Aphasia: Secondary | ICD-10-CM | POA: Diagnosis not present

## 2022-12-27 DIAGNOSIS — A4102 Sepsis due to Methicillin resistant Staphylococcus aureus: Secondary | ICD-10-CM | POA: Diagnosis present

## 2022-12-27 DIAGNOSIS — K219 Gastro-esophageal reflux disease without esophagitis: Secondary | ICD-10-CM | POA: Diagnosis present

## 2022-12-27 DIAGNOSIS — G8191 Hemiplegia, unspecified affecting right dominant side: Secondary | ICD-10-CM | POA: Diagnosis not present

## 2022-12-27 DIAGNOSIS — Z87891 Personal history of nicotine dependence: Secondary | ICD-10-CM | POA: Diagnosis not present

## 2022-12-27 DIAGNOSIS — J9601 Acute respiratory failure with hypoxia: Secondary | ICD-10-CM | POA: Diagnosis not present

## 2022-12-27 DIAGNOSIS — Z7901 Long term (current) use of anticoagulants: Secondary | ICD-10-CM

## 2022-12-27 DIAGNOSIS — J449 Chronic obstructive pulmonary disease, unspecified: Secondary | ICD-10-CM | POA: Diagnosis not present

## 2022-12-27 DIAGNOSIS — G629 Polyneuropathy, unspecified: Secondary | ICD-10-CM | POA: Diagnosis present

## 2022-12-27 DIAGNOSIS — N183 Chronic kidney disease, stage 3 unspecified: Secondary | ICD-10-CM | POA: Diagnosis present

## 2022-12-27 DIAGNOSIS — I480 Paroxysmal atrial fibrillation: Secondary | ICD-10-CM | POA: Diagnosis present

## 2022-12-27 DIAGNOSIS — I2489 Other forms of acute ischemic heart disease: Secondary | ICD-10-CM | POA: Diagnosis present

## 2022-12-27 DIAGNOSIS — N400 Enlarged prostate without lower urinary tract symptoms: Secondary | ICD-10-CM | POA: Diagnosis present

## 2022-12-27 DIAGNOSIS — T827XXA Infection and inflammatory reaction due to other cardiac and vascular devices, implants and grafts, initial encounter: Secondary | ICD-10-CM | POA: Diagnosis not present

## 2022-12-27 DIAGNOSIS — R6521 Severe sepsis with septic shock: Secondary | ICD-10-CM | POA: Diagnosis not present

## 2022-12-27 DIAGNOSIS — E875 Hyperkalemia: Secondary | ICD-10-CM | POA: Diagnosis not present

## 2022-12-27 DIAGNOSIS — Z789 Other specified health status: Secondary | ICD-10-CM | POA: Insufficient documentation

## 2022-12-27 DIAGNOSIS — G9341 Metabolic encephalopathy: Secondary | ICD-10-CM | POA: Diagnosis not present

## 2022-12-27 DIAGNOSIS — Z8249 Family history of ischemic heart disease and other diseases of the circulatory system: Secondary | ICD-10-CM | POA: Diagnosis not present

## 2022-12-27 DIAGNOSIS — I2510 Atherosclerotic heart disease of native coronary artery without angina pectoris: Secondary | ICD-10-CM | POA: Diagnosis present

## 2022-12-27 DIAGNOSIS — R6520 Severe sepsis without septic shock: Secondary | ICD-10-CM | POA: Diagnosis not present

## 2022-12-27 DIAGNOSIS — I8290 Acute embolism and thrombosis of unspecified vein: Secondary | ICD-10-CM | POA: Insufficient documentation

## 2022-12-27 DIAGNOSIS — Z515 Encounter for palliative care: Secondary | ICD-10-CM | POA: Diagnosis not present

## 2022-12-27 DIAGNOSIS — R531 Weakness: Secondary | ICD-10-CM | POA: Diagnosis not present

## 2022-12-27 DIAGNOSIS — A419 Sepsis, unspecified organism: Secondary | ICD-10-CM | POA: Insufficient documentation

## 2022-12-27 DIAGNOSIS — I63232 Cerebral infarction due to unspecified occlusion or stenosis of left carotid arteries: Secondary | ICD-10-CM | POA: Diagnosis not present

## 2022-12-27 DIAGNOSIS — E43 Unspecified severe protein-calorie malnutrition: Secondary | ICD-10-CM | POA: Diagnosis not present

## 2022-12-27 DIAGNOSIS — I5043 Acute on chronic combined systolic (congestive) and diastolic (congestive) heart failure: Secondary | ICD-10-CM | POA: Diagnosis not present

## 2022-12-27 DIAGNOSIS — F32A Depression, unspecified: Secondary | ICD-10-CM | POA: Diagnosis present

## 2022-12-27 DIAGNOSIS — Z79899 Other long term (current) drug therapy: Secondary | ICD-10-CM

## 2022-12-27 DIAGNOSIS — Z7189 Other specified counseling: Secondary | ICD-10-CM | POA: Diagnosis not present

## 2022-12-27 DIAGNOSIS — I639 Cerebral infarction, unspecified: Secondary | ICD-10-CM | POA: Diagnosis not present

## 2022-12-27 DIAGNOSIS — Z9981 Dependence on supplemental oxygen: Secondary | ICD-10-CM

## 2022-12-27 DIAGNOSIS — R7989 Other specified abnormal findings of blood chemistry: Secondary | ICD-10-CM | POA: Diagnosis not present

## 2022-12-27 DIAGNOSIS — Z8546 Personal history of malignant neoplasm of prostate: Secondary | ICD-10-CM

## 2022-12-27 DIAGNOSIS — Z7951 Long term (current) use of inhaled steroids: Secondary | ICD-10-CM

## 2022-12-27 DIAGNOSIS — D649 Anemia, unspecified: Secondary | ICD-10-CM | POA: Diagnosis not present

## 2022-12-27 DIAGNOSIS — I4891 Unspecified atrial fibrillation: Principal | ICD-10-CM | POA: Diagnosis present

## 2022-12-27 DIAGNOSIS — R7881 Bacteremia: Secondary | ICD-10-CM | POA: Diagnosis not present

## 2022-12-27 DIAGNOSIS — E785 Hyperlipidemia, unspecified: Secondary | ICD-10-CM | POA: Diagnosis present

## 2022-12-27 DIAGNOSIS — K2270 Barrett's esophagus without dysplasia: Secondary | ICD-10-CM | POA: Diagnosis present

## 2022-12-27 DIAGNOSIS — R627 Adult failure to thrive: Secondary | ICD-10-CM | POA: Diagnosis not present

## 2022-12-27 DIAGNOSIS — Z96641 Presence of right artificial hip joint: Secondary | ICD-10-CM | POA: Diagnosis present

## 2022-12-27 DIAGNOSIS — M542 Cervicalgia: Secondary | ICD-10-CM | POA: Diagnosis not present

## 2022-12-27 DIAGNOSIS — N1832 Chronic kidney disease, stage 3b: Secondary | ICD-10-CM | POA: Diagnosis not present

## 2022-12-27 DIAGNOSIS — I110 Hypertensive heart disease with heart failure: Secondary | ICD-10-CM | POA: Diagnosis not present

## 2022-12-27 LAB — COMPREHENSIVE METABOLIC PANEL
ALT: 25 U/L (ref 0–44)
AST: 25 U/L (ref 15–41)
Albumin: 3.5 g/dL (ref 3.5–5.0)
Alkaline Phosphatase: 81 U/L (ref 38–126)
Anion gap: 8 (ref 5–15)
BUN: 19 mg/dL (ref 8–23)
CO2: 25 mmol/L (ref 22–32)
Calcium: 9.4 mg/dL (ref 8.9–10.3)
Chloride: 105 mmol/L (ref 98–111)
Creatinine, Ser: 1.54 mg/dL — ABNORMAL HIGH (ref 0.61–1.24)
GFR, Estimated: 45 mL/min — ABNORMAL LOW (ref >60)
Glucose, Bld: 108 mg/dL — ABNORMAL HIGH (ref 70–99)
Potassium: 4.2 mmol/L (ref 3.5–5.1)
Sodium: 138 mmol/L (ref 135–145)
Total Bilirubin: 0.6 mg/dL (ref 0.3–1.2)
Total Protein: 6.2 g/dL — ABNORMAL LOW (ref 6.5–8.1)

## 2022-12-27 LAB — CBC
HCT: 31.5 % — ABNORMAL LOW (ref 39.0–52.0)
Hemoglobin: 9.7 g/dL — ABNORMAL LOW (ref 13.0–17.0)
MCH: 29.2 pg (ref 26.0–34.0)
MCHC: 30.8 g/dL (ref 30.0–36.0)
MCV: 94.9 fL (ref 80.0–100.0)
Platelets: 178 10*3/uL (ref 150–400)
RBC: 3.32 MIL/uL — ABNORMAL LOW (ref 4.22–5.81)
RDW: 14.4 % (ref 11.5–15.5)
WBC: 10.9 10*3/uL — ABNORMAL HIGH (ref 4.0–10.5)
nRBC: 0 % (ref 0.0–0.2)

## 2022-12-27 LAB — GLUCOSE, CAPILLARY: Glucose-Capillary: 115 mg/dL — ABNORMAL HIGH (ref 70–99)

## 2022-12-27 LAB — TROPONIN I (HIGH SENSITIVITY)
Troponin I (High Sensitivity): 29 ng/L — ABNORMAL HIGH (ref <18)
Troponin I (High Sensitivity): 28 ng/L — ABNORMAL HIGH (ref <18)
Troponin I (High Sensitivity): 30 ng/L — ABNORMAL HIGH (ref <18)

## 2022-12-27 LAB — BRAIN NATRIURETIC PEPTIDE: B Natriuretic Peptide: 1110.6 pg/mL — ABNORMAL HIGH (ref 0.0–100.0)

## 2022-12-27 LAB — MAGNESIUM: Magnesium: 1.9 mg/dL (ref 1.7–2.4)

## 2022-12-27 MED ORDER — ACETAMINOPHEN 325 MG PO TABS
650.0000 mg | ORAL_TABLET | Freq: Four times a day (QID) | ORAL | Status: DC | PRN
  Administered 2022-12-27 – 2022-12-28 (×3): 650 mg via ORAL
  Filled 2022-12-27 (×4): qty 2

## 2022-12-27 MED ORDER — HYDROMORPHONE HCL 1 MG/ML IJ SOLN
0.5000 mg | INTRAMUSCULAR | Status: DC | PRN
  Administered 2022-12-27 – 2022-12-28 (×2): 1 mg via INTRAVENOUS
  Filled 2022-12-27 (×2): qty 1

## 2022-12-27 MED ORDER — DILTIAZEM LOAD VIA INFUSION
5.0000 mg | Freq: Once | INTRAVENOUS | Status: AC
  Administered 2022-12-27: 5 mg via INTRAVENOUS
  Filled 2022-12-27: qty 5

## 2022-12-27 MED ORDER — CALCIUM CARBONATE ANTACID 500 MG PO CHEW
750.0000 mg | CHEWABLE_TABLET | Freq: Every day | ORAL | Status: DC
  Administered 2022-12-28: 750 mg via ORAL
  Filled 2022-12-27 (×2): qty 4

## 2022-12-27 MED ORDER — OXYCODONE HCL 5 MG PO TABS
5.0000 mg | ORAL_TABLET | ORAL | Status: DC | PRN
  Administered 2022-12-28 – 2022-12-29 (×2): 5 mg via ORAL
  Filled 2022-12-27 (×3): qty 1

## 2022-12-27 MED ORDER — PYRIDOSTIGMINE BROMIDE 60 MG PO TABS
60.0000 mg | ORAL_TABLET | Freq: Three times a day (TID) | ORAL | Status: DC
  Administered 2022-12-27 – 2022-12-29 (×5): 60 mg via ORAL
  Filled 2022-12-27 (×6): qty 1

## 2022-12-27 MED ORDER — PANTOPRAZOLE SODIUM 40 MG PO TBEC
40.0000 mg | DELAYED_RELEASE_TABLET | Freq: Two times a day (BID) | ORAL | Status: DC
  Administered 2022-12-27 – 2022-12-28 (×3): 40 mg via ORAL
  Filled 2022-12-27 (×4): qty 1

## 2022-12-27 MED ORDER — ADULT MULTIVITAMIN W/MINERALS CH
1.0000 | ORAL_TABLET | Freq: Every day | ORAL | Status: DC
  Filled 2022-12-27: qty 1

## 2022-12-27 MED ORDER — GABAPENTIN 100 MG PO CAPS
100.0000 mg | ORAL_CAPSULE | Freq: Every day | ORAL | Status: DC
  Administered 2022-12-27 – 2022-12-28 (×2): 100 mg via ORAL
  Filled 2022-12-27 (×2): qty 1

## 2022-12-27 MED ORDER — APIXABAN 2.5 MG PO TABS
2.5000 mg | ORAL_TABLET | Freq: Two times a day (BID) | ORAL | Status: DC
  Administered 2022-12-27 – 2022-12-28 (×3): 2.5 mg via ORAL
  Filled 2022-12-27 (×3): qty 1

## 2022-12-27 MED ORDER — ATORVASTATIN CALCIUM 20 MG PO TABS
80.0000 mg | ORAL_TABLET | Freq: Every day | ORAL | Status: DC
  Administered 2022-12-28: 80 mg via ORAL
  Filled 2022-12-27 (×2): qty 4

## 2022-12-27 MED ORDER — ONDANSETRON HCL 4 MG/2ML IJ SOLN
4.0000 mg | Freq: Four times a day (QID) | INTRAMUSCULAR | Status: DC | PRN
  Administered 2022-12-29: 4 mg via INTRAVENOUS
  Filled 2022-12-27: qty 2

## 2022-12-27 MED ORDER — POLYETHYLENE GLYCOL 3350 17 G PO PACK
17.0000 g | PACK | Freq: Two times a day (BID) | ORAL | Status: DC
  Administered 2022-12-28: 17 g via ORAL
  Filled 2022-12-27 (×2): qty 1

## 2022-12-27 MED ORDER — POLYETHYLENE GLYCOL 3350 17 G PO PACK: 17.0000 g | PACK | Freq: Every day | ORAL | Status: DC | PRN

## 2022-12-27 MED ORDER — MAGNESIUM OXIDE -MG SUPPLEMENT 400 (240 MG) MG PO TABS
400.0000 mg | ORAL_TABLET | Freq: Every day | ORAL | Status: DC
  Administered 2022-12-28: 400 mg via ORAL
  Filled 2022-12-27 (×2): qty 1

## 2022-12-27 MED ORDER — MIDODRINE HCL 5 MG PO TABS
30.0000 mg | ORAL_TABLET | Freq: Three times a day (TID) | ORAL | Status: DC
  Administered 2022-12-28 (×3): 30 mg via ORAL
  Filled 2022-12-27 (×4): qty 6

## 2022-12-27 MED ORDER — PROSOURCE PLUS PO LIQD
30.0000 mL | Freq: Three times a day (TID) | ORAL | Status: DC
  Administered 2022-12-28: 30 mL via ORAL
  Filled 2022-12-27 (×6): qty 30

## 2022-12-27 MED ORDER — FLUTICASONE FUROATE-VILANTEROL 200-25 MCG/ACT IN AEPB
1.0000 | INHALATION_SPRAY | Freq: Every day | RESPIRATORY_TRACT | Status: DC
  Administered 2022-12-28: 1 via RESPIRATORY_TRACT
  Filled 2022-12-27: qty 28

## 2022-12-27 MED ORDER — TAMSULOSIN HCL 0.4 MG PO CAPS
0.4000 mg | ORAL_CAPSULE | Freq: Every day | ORAL | Status: DC
  Administered 2022-12-28: 0.4 mg via ORAL
  Filled 2022-12-27 (×2): qty 1

## 2022-12-27 MED ORDER — SODIUM CHLORIDE 0.9 % IV BOLUS
500.0000 mL | Freq: Once | INTRAVENOUS | Status: AC
  Administered 2022-12-27: 500 mL via INTRAVENOUS

## 2022-12-27 MED ORDER — IOHEXOL 350 MG/ML SOLN
75.0000 mL | Freq: Once | INTRAVENOUS | Status: AC | PRN
  Administered 2022-12-27: 75 mL via INTRAVENOUS

## 2022-12-27 MED ORDER — SODIUM CHLORIDE 0.9% FLUSH
3.0000 mL | Freq: Two times a day (BID) | INTRAVENOUS | Status: DC
  Administered 2022-12-27 – 2022-12-29 (×4): 3 mL via INTRAVENOUS

## 2022-12-27 MED ORDER — SERTRALINE HCL 50 MG PO TABS
25.0000 mg | ORAL_TABLET | Freq: Every day | ORAL | Status: DC
  Administered 2022-12-28: 25 mg via ORAL
  Filled 2022-12-27 (×2): qty 1

## 2022-12-27 MED ORDER — DILTIAZEM HCL 25 MG/5ML IV SOLN
10.0000 mg | Freq: Once | INTRAVENOUS | Status: AC
  Administered 2022-12-27: 10 mg via INTRAVENOUS
  Filled 2022-12-27: qty 5

## 2022-12-27 MED ORDER — TRAZODONE HCL 50 MG PO TABS: 50.0000 mg | ORAL_TABLET | Freq: Every evening | ORAL | Status: DC | PRN

## 2022-12-27 MED ORDER — ONDANSETRON HCL 4 MG PO TABS: 4.0000 mg | ORAL_TABLET | Freq: Four times a day (QID) | ORAL | Status: DC | PRN

## 2022-12-27 MED ORDER — METOPROLOL TARTRATE 37.5 MG PO TABS: 37.5000 mg | ORAL_TABLET | Freq: Two times a day (BID) | ORAL | Status: DC

## 2022-12-27 MED ORDER — MELATONIN 5 MG PO TABS
10.0000 mg | ORAL_TABLET | Freq: Every day | ORAL | Status: DC
  Administered 2022-12-27 – 2022-12-28 (×2): 10 mg via ORAL
  Filled 2022-12-27 (×2): qty 2

## 2022-12-27 MED ORDER — FUROSEMIDE 10 MG/ML IJ SOLN
20.0000 mg | Freq: Once | INTRAMUSCULAR | Status: AC
  Administered 2022-12-27: 20 mg via INTRAVENOUS
  Filled 2022-12-27: qty 2

## 2022-12-27 MED ORDER — ACETAMINOPHEN 650 MG RE SUPP: 650.0000 mg | Freq: Four times a day (QID) | RECTAL | Status: DC | PRN

## 2022-12-27 MED ORDER — ALBUTEROL SULFATE (2.5 MG/3ML) 0.083% IN NEBU: 2.5000 mg | INHALATION_SOLUTION | RESPIRATORY_TRACT | Status: DC | PRN

## 2022-12-27 MED ORDER — VITAMIN C 500 MG PO TABS
500.0000 mg | ORAL_TABLET | Freq: Every day | ORAL | Status: DC
  Administered 2022-12-28: 500 mg via ORAL
  Filled 2022-12-27 (×2): qty 1

## 2022-12-27 MED ORDER — DILTIAZEM HCL-DEXTROSE 125-5 MG/125ML-% IV SOLN (PREMIX)
5.0000 mg/h | INTRAVENOUS | Status: DC
  Administered 2022-12-27: 5 mg/h via INTRAVENOUS
  Filled 2022-12-27: qty 125

## 2022-12-27 MED ORDER — SUCRALFATE 1 GM/10ML PO SUSP
1.0000 g | Freq: Three times a day (TID) | ORAL | Status: DC
  Administered 2022-12-27 – 2022-12-28 (×4): 1 g via ORAL
  Filled 2022-12-27 (×8): qty 10

## 2022-12-27 NOTE — Assessment & Plan Note (Addendum)
On exam patient demonstrates JVD, BNP is significantly elevated compared to prior.  No clear precipitant, though has recently been discontinued from his Lasix due to difficulties with soft blood pressures.  Suspect this is also what provoked his RVR though he also received a DuoNeb prior to arrival which could also have contributed. - Given difficult volume status will give 1 dose of 20 mg IV Lasix and then reassess in the a.m. - Consult cardiology in a.m. - Strict I's and O's and daily weights - Telemetry - Replete lites as needed -Hold metoprolol for time being, see RVR problem - Continue atorvastatin - Continue midodrine

## 2022-12-27 NOTE — Progress Notes (Signed)
OK to place male wick per Dr. Joylene Igo.

## 2022-12-27 NOTE — Assessment & Plan Note (Signed)
Low suspicion for exacerbation at this point, no wheezing appreciated on exam and overall evaluation points more towards cardiac etiology.  Received 1 dose of Solu-Medrol prior to arrival, will discontinue steroids for now.  Continue home inhalers.

## 2022-12-27 NOTE — Progress Notes (Signed)
Patient admitted to ICU18. No admission orders. Patient c/o right hip pain 6/10. Dr. Joylene Igo notified; acknowledged.

## 2022-12-27 NOTE — ED Provider Triage Note (Signed)
Emergency Medicine Provider Triage Evaluation Note  Matthew Zamora, a 82 y.o. male  was evaluated in triage.  Pt complains of anxiety and shortness of breath.  Patient presents to the ED via EMS from home where he called out for transport.  Patient with history of COPD wears 3 L of O2 chronically.  He had a DuoNeb and 25 mg of Solu-Medrol given prior to arrival.  Review of Systems  Positive: SOB Negative: FCS  Physical Exam  BP 114/72 (BP Location: Left Arm)   Pulse 77   Temp 98.3 F (36.8 C) (Oral)   Resp 20   SpO2 100%  Gen:   Awake, no distress  NAD.  Resp:  Normal effort CTA MSK:   Moves extremities without difficulty  Other:    Medical Decision Making  Medically screening exam initiated at 12:12 PM.  Appropriate orders placed.  Matthew Zamora was informed that the remainder of the evaluation will be completed by another provider, this initial triage assessment does not replace that evaluation, and the importance of remaining in the ED until their evaluation is complete.  Geriatric patient to the ED for evaluation of shortness of breath.  Patient on 3 L of O2 chronically with a history of COPD.   Lissa Hoard, PA-C 12/27/22 1221

## 2022-12-27 NOTE — ED Provider Notes (Signed)
[facility] Provider Note    Event Date/Time   First MD Initiated Contact with Patient 12/27/22 1603     (approximate)   History   Shortness of Breath   HPI  Matthew Zamora is a 82 y.o. male  with h/o AFib, HTN, HLD, CAD, CHF, here with SOB. Pt also has reported h/o COPD. Reports he began to feel acutely SOB earlier today, was found to be wheezing with ems. Was given solumedrol, nebulizers with improvement in his SOB but onset of AFib RVR. He has felt weak since then. No chest pain. No other complaints. No leg swelling. No fevers, chills.        Physical Exam   Triage Vital Signs: ED Triage Vitals  Encounter Vitals Group     BP 12/27/22 1209 114/72     Systolic BP Percentile --      Diastolic BP Percentile --      Pulse Rate 12/27/22 1209 77     Resp 12/27/22 1209 20     Temp 12/27/22 1209 98.3 F (36.8 C)     Temp Source 12/27/22 1209 Oral     SpO2 12/27/22 1209 100 %     Weight 12/27/22 1214 130 lb (59 kg)     Height 12/27/22 1214 5\' 8"  (1.727 m)     Head Circumference --      Peak Flow --      Pain Score 12/27/22 1214 0     Pain Loc --      Pain Education --      Exclude from Growth Chart --     Most recent vital signs: Vitals:   12/27/22 1830 12/27/22 1850  BP: 106/77 109/67  Pulse: 90 94  Resp: 18 20  Temp:    SpO2: 100% 99%     General: Awake, no distress.  CV:  Good peripheral perfusion. Tachycardic, irregularly irregular. Resp:  Normal work of breathing. Lungs overall clear. Abd:  No distention. No tenderness. Other:  Trace edema.   ED Results / Procedures / Treatments   Labs (all labs ordered are listed, but only abnormal results are displayed) Labs Reviewed  CBC - Abnormal; Notable for the following components:      Result Value   WBC 10.9 (*)    RBC 3.32 (*)    Hemoglobin 9.7 (*)    HCT 31.5 (*)    All other components within normal limits  COMPREHENSIVE METABOLIC PANEL - Abnormal; Notable for the  following components:   Glucose, Bld 108 (*)    Creatinine, Ser 1.54 (*)    Total Protein 6.2 (*)    GFR, Estimated 45 (*)    All other components within normal limits  BRAIN NATRIURETIC PEPTIDE - Abnormal; Notable for the following components:   B Natriuretic Peptide 1,110.6 (*)    All other components within normal limits  TROPONIN I (HIGH SENSITIVITY) - Abnormal; Notable for the following components:   Troponin I (High Sensitivity) 29 (*)    All other components within normal limits  TROPONIN I (HIGH SENSITIVITY) - Abnormal; Notable for the following components:   Troponin I (High Sensitivity) 28 (*)    All other components within normal limits  MAGNESIUM     EKG Atrial fibrillation, VR 116. QRS 94, QTc 355. No acute St elevations or depressions. No ischemia or infarct.   RADIOLOGY CXR: Cardiomegaly, no pulm edema   I also independently reviewed and agree with radiologist interpretations.   PROCEDURES:  Critical Care performed: Yes, see critical care procedure note(s)  .Critical Care  Performed by: Shaune Pollack, MD Authorized by: Shaune Pollack, MD   Critical care provider statement:    Critical care time (minutes):  30   Critical care time was exclusive of:  Separately billable procedures and treating other patients   Critical care was necessary to treat or prevent imminent or life-threatening deterioration of the following conditions:  Cardiac failure, circulatory failure and respiratory failure   Critical care was time spent personally by me on the following activities:  Development of treatment plan with patient or surrogate, discussions with consultants, evaluation of patient's response to treatment, examination of patient, ordering and review of laboratory studies, ordering and review of radiographic studies, ordering and performing treatments and interventions, pulse oximetry, re-evaluation of patient's condition and review of old charts     MEDICATIONS  ORDERED IN ED: Medications  diltiazem (CARDIZEM) 1 mg/mL load via infusion 5 mg (5 mg Intravenous Bolus from Bag 12/27/22 1755)    And  diltiazem (CARDIZEM) 125 mg in dextrose 5% 125 mL (1 mg/mL) infusion (5 mg/hr Intravenous New Bag/Given 12/27/22 1754)  sodium chloride 0.9 % bolus 500 mL (0 mLs Intravenous Stopped 12/27/22 1753)  diltiazem (CARDIZEM) injection 10 mg (10 mg Intravenous Given 12/27/22 1615)  iohexol (OMNIPAQUE) 350 MG/ML injection 75 mL (75 mLs Intravenous Contrast Given 12/27/22 1718)     IMPRESSION / MDM / ASSESSMENT AND PLAN / ED COURSE  I reviewed the triage vital signs and the nursing notes.                              Differential diagnosis includes, but is not limited to, AFib RVR, PNA, anemia, ACS, dehydration, polypharmacy  Patient's presentation is most consistent with acute presentation with potential threat to life or bodily function.  The patient is on the cardiac monitor to evaluate for evidence of arrhythmia and/or significant heart rate changes   82 year old male with history of A-fib, CHF, here with shortness of breath.  The patient arrives and is in apparent atrial fibrillation with rapid ventricular response.  Unclear whether this is primary versus secondary to albuterol though I suspect most of his symptoms are cardiac in nature.  Patient given diltiazem bolus with mild improvement but quick return to RVR.  Was placed on a drip.  Otherwise, no focal pneumonia on chest x-ray.  CBC shows mild leukocytosis.  Denies any fevers or major infectious symptoms.  His creatinine is at baseline.  Troponin 29, likely demand related.  BNP is elevated.  Hold on diuresis in the setting of borderline hypotension.   FINAL CLINICAL IMPRESSION(S) / ED DIAGNOSES   Final diagnoses:  Atrial fibrillation with rapid ventricular response ([name])     Rx / DC Orders   ED Discharge Orders     None        Note:  This document was prepared using Dragon voice recognition  software and may include unintentional dictation errors.   Shaune Pollack, MD 12/27/22 1900

## 2022-12-27 NOTE — Assessment & Plan Note (Addendum)
Neuropathy-continue gabapentin Insomnia-continue melatonin GERD-continue PPI, sucralfate Myasthenia gravis-continue pyridostigmine Anxiety/depression-continue sertraline BPH-continue tamsulosin

## 2022-12-27 NOTE — ED Triage Notes (Signed)
Pt brought in by [facility] from home pt had co shortness of breath. Pt denies any shob or pain at this time.

## 2022-12-27 NOTE — ED Notes (Signed)
Dr. Erma Heritage informed about patient's low blood pressure. Pt currently AxOx4.

## 2022-12-27 NOTE — Assessment & Plan Note (Signed)
Suspect provoked by volume overload though DuoNeb given prior to arrival could also be contributing. - Continue diltiazem drip, wean as tolerated and transition to home metoprolol when possible - Continue Eliquis

## 2022-12-27 NOTE — ED Triage Notes (Signed)
First nurse note: Pt here via [facility] with c/o of SHOB since this morning. Pt has HX of COPD. Pt had 1 duoneb. 125 mg of solumedrol given.   20G R AC.   127/76 RR 24 99% 3L/min via [place] [facility] 134

## 2022-12-27 NOTE — Assessment & Plan Note (Signed)
Mild troponinemia, likely secondary to volume overload and RVR in addition to element of false elevation from CKD.  Initially flat, will obtain 1 more value.

## 2022-12-27 NOTE — Assessment & Plan Note (Signed)
Currently on 4 L nasal cannula above his usual home oxygen of 3 L, wean as tolerated, hopefully will improve with diuresis.

## 2022-12-27 NOTE — H&P (Signed)
History and Physical    Patient: Matthew Zamora ZOX:[id number] DOB: 04/22/1941 DOA: 12/27/2022 DOS: the patient was seen and examined on 12/27/2022 PCP: Jerl Mina, MD  Patient coming from: Home  Chief Complaint:  Chief Complaint  Patient presents with   Shortness of Breath   HPI: Matthew Zamora is a 82 y.o. 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  Patient reports he was sitting at home when he suddenly felt acutely short of breath.  He denies missing any medications recently.  No dietary indiscretions.  He has felt well since his recent admission.  Currently reports that he feels okay.  Denies chest pain at any point.  No swelling in his legs.  He reports that he wears oxygen intermittently at home up to 3 L, primarily when he is moving around and being active but not always while at rest.  On review of chart patient last saw cardiology on September 28, at that time his Lasix was discontinued due to concerns about hypotension.  Subsequently admitted to the hospital from July 2 to 5 for combined COPD and CHF exacerbation.  He was diuresed, Lasix was not continued daily at discharge, per last cardiology consultation note on July 5 plan to use Lasix as needed.  Patient arrived via EMS.  En route he was given a DuoNeb, Solu-Medrol. In the ED vital signs notable for tachycardia into the 140s, soft blood pressures, normal oxygenation on 3 L nasal cannula.  Lab workup notable for CMP with CKD at baseline, overall unremarkable.  CBC at baseline.  BNP markedly elevated at 1110, increased from 448 9 days prior.  Initial troponin was 29 which down trended to 28 2 hours later.  EKG showed A-fib with RVR, compared to prior some new T wave inversions were evident in the inferior lateral leads but no ST changes noted.  Chest x-ray showed cardiomegaly but no signs of pulmonary edema.  CTPA  was obtained which showed no evidence of PE, emphysematous lung changes, overall no acute findings.  He was started on a diltiazem drip which his RVR responded to nicely, and admitted for further management.    Review of Systems: As mentioned in the history of present illness. All other systems reviewed and are negative. 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 pylori infection    History of hepatitis    Hyperlipidemia    Hypertension    Liver cyst 05/16/15   PAF (paroxysmal atrial fibrillation) ([name])    Prostate CA [facility])    Past Surgical History:  Procedure Laterality Date   CATARACT EXTRACTION     COLONOSCOPY  10/06/2014, 09/18/2004, 03/14/2009   ESOPHAGOGASTRODUODENOSCOPY  10/06/2014, 03/18/2001, 03/14/2009   ESOPHAGOGASTRODUODENOSCOPY (EGD) WITH PROPOFOL N/A 05/07/2018   Procedure: ESOPHAGOGASTRODUODENOSCOPY (EGD) WITH PROPOFOL;  Surgeon: Toledo, Boykin Nearing, MD;  Location: [facility] ENDOSCOPY;  Service: Gastroenterology;  Laterality: N/A;   ESOPHAGOGASTRODUODENOSCOPY (EGD) WITH PROPOFOL N/A 04/22/2022   Procedure: ESOPHAGOGASTRODUODENOSCOPY (EGD) WITH PROPOFOL;  Surgeon: Toney Reil, MD;  Location: [facility] ENDOSCOPY;  Service: Endoscopy;  Laterality: N/A;   FLEXIBLE SIGMOIDOSCOPY  08/26/1990   HIP ARTHROPLASTY Right 10/31/2022   Procedure: ARTHROPLASTY BIPOLAR HIP (HEMIARTHROPLASTY)-RNFA;  Surgeon: Reinaldo Berber, MD;  Location: [facility] ORS;  Service: Orthopedics;  Laterality: Right;   INSERTION OF ICD     PROSTATE SURGERY     TONSILLECTOMY     Social History:  reports that he quit smoking about 23 years ago. His smoking use included cigarettes. He  started smoking about 61 years ago. He has a 38 pack-year smoking history. He has never used smokeless tobacco. He reports that he does not currently use drugs. He reports that he does not drink alcohol.  Allergies  Allergen Reactions   Nitrofuran Derivatives Other (See Comments)    Transaminitis ** confounded w/Amiodarone, Mexiletine   Spironolactone     Significant transaminitis     Family History  Problem Relation Age of Onset   Heart attack Mother    Heart attack Father     Prior to Admission medications   Medication Sig Start Date End Date Taking? Authorizing Provider  acetaminophen (TYLENOL) 325 MG tablet Take 2 tablets (650 mg total) by mouth every 4 (four) hours as needed for headache or mild pain. 10/29/19   Judithe Modest, NP  albuterol (PROVENTIL) (2.5 MG/3ML) 0.083% nebulizer solution Inhale 3 mLs (2.5 mg total) into the lungs as needed for shortness of breath. 10/29/19   Judithe Modest, NP  ascorbic acid (VITAMIN C) 500 MG tablet Take 1 tablet by mouth daily.    [provider]  atorvastatin (LIPITOR) 80 MG tablet Take 80 mg by mouth daily. 05/13/21   [provider]  calcium carbonate (TUMS EX) 750 MG chewable tablet Chew 1 tablet by mouth daily.    [provider]  ELIQUIS 2.5 MG TABS tablet Take 2.5 mg by mouth 2 (two) times daily. 12/28/21   [provider]  fluticasone-salmeterol (ADVAIR) 500-50 MCG/ACT AEPB Inhale 1 puff into the lungs in the morning and at bedtime. 04/05/22   [provider]  gabapentin (NEURONTIN) 100 MG capsule Take 100 mg by mouth at bedtime. 12/09/19   [provider]  guaiFENesin-dextromethorphan (ROBITUSSIN DM) 100-10 MG/5ML syrup Take 5 mLs by mouth every 4 (four) hours as needed for cough. 11/18/22   Enedina Finner, MD  magnesium oxide (MAG-OX) 400 (240 Mg) MG tablet Take 1 tablet by mouth daily. 02/05/21   [provider]  melatonin 3 MG TABS tablet Take 9 mg by mouth at bedtime.     [provider]  metoCLOPramide (REGLAN) 5 MG tablet Take 1 tablet (5 mg total) by mouth every 8 (eight) hours as needed for refractory nausea / vomiting. 05/13/22   Delfino Lovett, MD  metoprolol tartrate 37.5 MG TABS Take 1 tablet (37.5 mg total) by mouth 2 (two) times daily. 12/20/22 01/19/23  Lucile Shutters, MD  midodrine (PROAMATINE) 5 MG tablet Take 1 tablet (5 mg total) by mouth 3 (three) times daily with meals. Patient taking differently: Take 30 mg by mouth 3 (three) times daily with meals. 11/18/22   Enedina Finner, MD  Multiple Vitamin (MULTIVITAMIN WITH MINERALS) TABS tablet Take 1 tablet by mouth daily. 10/29/19   Judithe Modest, NP  Nebulizer MISC 1 each by Does not apply route as needed. 06/16/21   Rolly Salter, MD  Nutritional Supplements (,FEEDING SUPPLEMENT, PROSOURCE PLUS) liquid Take 30 mLs by mouth 3 (three) times daily between meals. 05/13/22   Delfino Lovett, MD  ondansetron (ZOFRAN-ODT) 4 MG disintegrating  tablet Take 4 mg by mouth every 8 (eight) hours as needed for refractory nausea / vomiting, vomiting or nausea.    [provider]  pantoprazole (PROTONIX) 40 MG tablet Take 1 tablet (40 mg total) by mouth 2 (two) times daily. 05/13/22   Delfino Lovett, MD  polyethylene glycol (MIRALAX / GLYCOLAX) 17 g packet Take 17 g by mouth 2 (two) times daily. 11/07/22   Leeroy Bock, MD  pyridostigmine (MESTINON) 60 MG tablet Take 1 tablet (60 mg total) by mouth every 8 (eight) hours. 05/13/22   Delfino Lovett, MD  sertraline (ZOLOFT) 25 MG tablet Take 25 mg by mouth daily.    [provider]  sucralfate (CARAFATE) 1 GM/10ML suspension Take 10 mLs (1 g total) by mouth 4 (four) times daily -  with meals and at bedtime. 05/13/22   Delfino Lovett, MD  tamsulosin (FLOMAX) 0.4 MG CAPS capsule Take 0.4 mg by mouth daily. 12/09/19   [provider]    Physical Exam: Vitals:   12/27/22 2048 12/27/22 2100 12/27/22 2115 12/27/22 2130  BP: 129/75 132/83 (!) 145/112    Pulse: (!) 140 (!) 113 (!) 112 (!) 124  Resp: (!) 30 (!) 30 (!) 24 (!) 22  Temp: 98.4 F (36.9 C)     TempSrc: Oral     SpO2: 100% 100% 100% 100%  Weight:      Height:       Physical Exam Constitutional:      General: He is not in acute distress.    Appearance: He is not ill-appearing, toxic-appearing or diaphoretic.  HENT:     Head: Normocephalic and atraumatic.  Neck:     Vascular: Hepatojugular reflux and JVD present.     Comments: Patient seated at about 45 degree angle, JVD present slightly more than halfway up to jawline, positive hepatojugular reflex up to ear Cardiovascular:     Rate and Rhythm: Tachycardia present. Rhythm irregular.     Heart sounds: Normal heart sounds.  Pulmonary:     Effort: Pulmonary effort is normal. No respiratory distress.     Breath sounds: Examination of the right-lower field reveals decreased breath sounds and rales. Examination of the left-lower field reveals decreased breath sounds and rales. Decreased breath sounds and rales present.  Abdominal:     General: Bowel sounds are normal.     Palpations: Abdomen is soft.  Musculoskeletal:     Right lower leg: No edema.     Left lower leg: No edema.  Skin:    General: Skin is warm and dry.  Neurological:     Mental Status: He is alert.  Psychiatric:        Mood and Affect: Mood normal.        Behavior: Behavior normal.      Data Reviewed: Results for orders placed or performed during the hospital encounter of 12/27/22 (from the past 24 hour(s))  CBC     Status: Abnormal   Collection Time: 12/27/22 12:16 PM  Result Value Ref Range   WBC 10.9 (H) 4.0 - 10.5 K/uL   RBC 3.32 (L) 4.22 - 5.81 MIL/uL   Hemoglobin 9.7 (L) 13.0 - 17.0 g/dL   HCT 16.1 (L) 09.6 - 04.5 %   MCV 94.9 80.0 - 100.0 fL   MCH 29.2 26.0 - 34.0 pg   MCHC 30.8 30.0 - 36.0 g/dL   RDW 40.9 81.1 - 91.4 %   Platelets 178 150 - 400 K/uL   nRBC 0.0 0.0 -  0.2 %  Comprehensive metabolic panel     Status: Abnormal    Collection Time: 12/27/22 12:16 PM  Result Value Ref Range   Sodium 138 135 - 145 mmol/L   Potassium 4.2 3.5 - 5.1 mmol/L   Chloride 105 98 - 111 mmol/L   CO2 25 22 - 32 mmol/L   Glucose, Bld 108 (H) 70 - 99 mg/dL   BUN 19 8 - 23 mg/dL   Creatinine, Ser 7.82 (H) 0.61 - 1.24 mg/dL   Calcium 9.4 8.9 - 95.6 mg/dL   Total Protein 6.2 (L) 6.5 - 8.1 g/dL   Albumin 3.5 3.5 - 5.0 g/dL   AST 25 15 - 41 U/L   ALT 25 0 - 44 U/L   Alkaline Phosphatase 81 38 - 126 U/L   Total Bilirubin 0.6 0.3 - 1.2 mg/dL   GFR, Estimated 45 (L) >60 mL/min   Anion gap 8 5 - 15  Troponin I (High Sensitivity)     Status: Abnormal   Collection Time: 12/27/22 12:16 PM  Result Value Ref Range   Troponin I (High Sensitivity) 29 (H) <18 ng/L  Brain natriuretic peptide     Status: Abnormal   Collection Time: 12/27/22 12:16 PM  Result Value Ref Range   B Natriuretic Peptide 1,110.6 (H) 0.0 - 100.0 pg/mL  Magnesium     Status: None   Collection Time: 12/27/22 12:16 PM  Result Value Ref Range   Magnesium 1.9 1.7 - 2.4 mg/dL  Troponin I (High Sensitivity)     Status: Abnormal   Collection Time: 12/27/22  2:16 PM  Result Value Ref Range   Troponin I (High Sensitivity) 28 (H) <18 ng/L  Glucose, capillary     Status: Abnormal   Collection Time: 12/27/22  8:08 PM  Result Value Ref Range   Glucose-Capillary 115 (H) 70 - 99 mg/dL   CT Angio Chest PE W and/or Wo Contrast CLINICAL DATA:  Pulmonary embolus suspected with high probability. Shortness of breath. COPD.  EXAM: CT ANGIOGRAPHY CHEST WITH CONTRAST  TECHNIQUE: Multidetector CT imaging of the chest was performed using the standard protocol during bolus administration of intravenous contrast. Multiplanar CT image reconstructions and MIPs were obtained to evaluate the vascular anatomy.  RADIATION DOSE REDUCTION: This exam was performed according to the departmental dose-optimization program which includes automated exposure control, adjustment of the mA  and/or kV according to patient size and/or use of iterative reconstruction technique.  CONTRAST:  75mL OMNIPAQUE IOHEXOL 350 MG/ML SOLN  COMPARISON:  Chest radiograph 12/17/2022.  CT 11/10/2022.  FINDINGS: Cardiovascular: Good opacification of the central and segmental pulmonary arteries resulting in technically adequate study. No focal filling defects. No evidence of significant pulmonary embolus. Normal heart size. No pericardial effusions. Normal caliber thoracic aorta. Calcification of the aorta and coronary arteries.  Mediastinum/Nodes: Thyroid gland is unremarkable. Esophagus is decompressed. No significant lymphadenopathy.  Lungs/Pleura: Emphysematous changes and scattered fibrosis in the lungs. Bronchial wall thickening, subpleural fibrosis and ground-glass changes likely representing changes of respiratory bronchiolitis. No pleural effusions. No pneumothorax.  Upper Abdomen: Cholelithiasis with small stone in the gallbladder. No inflammatory infiltration demonstrated. Mass in the inferior right lobe of the liver measuring 4.9 x 5.5 cm in diameter. This has been present on previous studies including CT abdomen and pelvis from 03/27/2022 and dating back to 05/16/2015. Lesion is slowly growing but long-term presence of the lesion suggest likely benign etiology. This could represent a biliary cystadenoma. The lesion is incompletely visualized and not well characterized  on this study which was optimized for evaluation of pulmonary arteries.  Musculoskeletal: No acute bony abnormalities. Old right rib fractures.  Review of the MIP images confirms the above findings.  IMPRESSION: 1. No evidence of significant pulmonary embolus. 2. 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 3. Cholelithiasis without inflammatory change. 4. Cystic lesion in the liver is incompletely visualized but present on prior studies. Likely benign. 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 5. Aortic atherosclerosis.  Electronically Signed   By: Burman Nieves M.D.   On: 12/27/2022 17:48 DG Chest 2 View CLINICAL DATA:  Shortness of breath  EXAM: CHEST - 2 VIEW  COMPARISON:  Previous studies including the examination of 12/17/2022  FINDINGS: Transverse diameter of heart is increased. Increase in AP diameter of chest suggests COPD. Thoracic aorta is tortuous and ectatic. There are no signs of pulmonary edema or focal pulmonary consolidation. There is interval clearing of vascular congestion. There is no pleural effusion or pneumothorax. Pacemaker/defibrillator battery is seen in the left infraclavicular region.  IMPRESSION: Cardiomegaly. There are no signs of pulmonary edema or focal pulmonary consolidation.  Electronically Signed   By: Ernie Avena M.D.   On: 12/27/2022 12:35   ECG (my read): A-fib with RVR, T wave inversions in inferior lateral leads are new compared to prior on July 4.  No ST segment changes.  Assessment and Plan: Matthew Zamora is a 82 y.o. male with medical history significant for HFrEF, COPD intermittently on home O2, A-fib, CKD, hypertension, who presents with acute shortness of breath and is found to be in A-fib with RVR likely secondary to acute on chronic HFrEF.  Acute on chronic combined systolic and diastolic CHF (congestive heart failure) ([name]) On exam patient demonstrates JVD, BNP is significantly elevated compared to prior.  No clear precipitant, though has recently been discontinued from his Lasix due to difficulties with soft blood pressures.  Suspect this is also what provoked his RVR though he also received a DuoNeb prior to arrival which could also have contributed. - Given difficult volume status will give 1 dose of 20 mg IV Lasix and then reassess in the a.m. - Consult cardiology in a.m. - Strict I's and O's and daily weights - Telemetry - Replete lites as needed -Hold metoprolol for time being,  see RVR problem - Continue atorvastatin - Continue midodrine  Acute on chronic respiratory failure with hypoxia ([name]) Currently on 4 L nasal cannula above his usual home oxygen of 3 L, wean as tolerated, hopefully will improve with diuresis.  Atrial fibrillation with RVR ([name]) Suspect provoked by volume overload though DuoNeb given prior to arrival could also be contributing. - Continue diltiazem drip, wean as tolerated and transition to home metoprolol when possible - Continue Eliquis  COPD (chronic obstructive pulmonary disease) ([name]) Low suspicion for exacerbation at this point, no wheezing appreciated on exam and overall evaluation points more towards cardiac etiology.  Received 1 dose of Solu-Medrol prior to arrival, will discontinue steroids for now.  Continue home inhalers.  Elevated troponin Mild troponinemia, likely secondary to volume overload and RVR in addition to element of false elevation from CKD.  Initially flat, will obtain 1 more value.  Known medical problems Neuropathy-continue gabapentin Insomnia-continue melatonin GERD-continue PPI, sucralfate Myasthenia gravis-continue pyridostigmine Anxiety/depression-continue sertraline BPH-continue tamsulosin     Advance Care Planning:   Code Status: Full Code , confirmed with patient  Consults: Cardiology in AM  Family Communication: None  Severity of Illness: The appropriate patient status for this  patient is INPATIENT. Inpatient status is judged to be reasonable and necessary in order to provide the required intensity of service to ensure the patient's safety. The patient's presenting symptoms, physical exam findings, and initial radiographic and laboratory data in the context of their chronic comorbidities is felt to place them at high risk for further clinical deterioration. Furthermore, it is not anticipated that the patient will be medically stable for discharge from the hospital within 2 midnights of admission.    * I certify that at the point of admission it is my clinical judgment that the patient will require inpatient hospital care spanning beyond 2 midnights from the point of admission due to high intensity of service, high risk for further deterioration and high frequency of surveillance required.*  Author: Venora Maples, MD 12/27/2022 9:30 PM  For on call review [email or web address].

## 2022-12-28 DIAGNOSIS — I4891 Unspecified atrial fibrillation: Secondary | ICD-10-CM | POA: Diagnosis not present

## 2022-12-28 DIAGNOSIS — I2510 Atherosclerotic heart disease of native coronary artery without angina pectoris: Secondary | ICD-10-CM | POA: Diagnosis not present

## 2022-12-28 DIAGNOSIS — J9601 Acute respiratory failure with hypoxia: Secondary | ICD-10-CM

## 2022-12-28 DIAGNOSIS — I5043 Acute on chronic combined systolic (congestive) and diastolic (congestive) heart failure: Secondary | ICD-10-CM

## 2022-12-28 DIAGNOSIS — R6520 Severe sepsis without septic shock: Secondary | ICD-10-CM

## 2022-12-28 DIAGNOSIS — A419 Sepsis, unspecified organism: Secondary | ICD-10-CM | POA: Diagnosis not present

## 2022-12-28 DIAGNOSIS — J9621 Acute and chronic respiratory failure with hypoxia: Secondary | ICD-10-CM

## 2022-12-28 DIAGNOSIS — R7989 Other specified abnormal findings of blood chemistry: Secondary | ICD-10-CM

## 2022-12-28 DIAGNOSIS — N1832 Chronic kidney disease, stage 3b: Secondary | ICD-10-CM

## 2022-12-28 DIAGNOSIS — J449 Chronic obstructive pulmonary disease, unspecified: Secondary | ICD-10-CM

## 2022-12-28 LAB — BLOOD CULTURE ID PANEL (REFLEXED) - BCID2

## 2022-12-28 LAB — RESPIRATORY PANEL BY PCR

## 2022-12-28 LAB — COMPREHENSIVE METABOLIC PANEL
ALT: 21 U/L (ref 0–44)
AST: 22 U/L (ref 15–41)
Albumin: 3.3 g/dL — ABNORMAL LOW (ref 3.5–5.0)
Alkaline Phosphatase: 66 U/L (ref 38–126)
Anion gap: 7 (ref 5–15)
BUN: 29 mg/dL — ABNORMAL HIGH (ref 8–23)
CO2: 24 mmol/L (ref 22–32)
Calcium: 8.4 mg/dL — ABNORMAL LOW (ref 8.9–10.3)
Chloride: 103 mmol/L (ref 98–111)
Creatinine, Ser: 1.76 mg/dL — ABNORMAL HIGH (ref 0.61–1.24)
GFR, Estimated: 39 mL/min — ABNORMAL LOW (ref >60)
Glucose, Bld: 102 mg/dL — ABNORMAL HIGH (ref 70–99)
Potassium: 4.1 mmol/L (ref 3.5–5.1)
Sodium: 134 mmol/L — ABNORMAL LOW (ref 135–145)
Total Bilirubin: 0.9 mg/dL (ref 0.3–1.2)
Total Protein: 5.7 g/dL — ABNORMAL LOW (ref 6.5–8.1)

## 2022-12-28 LAB — CBC
HCT: 26.8 % — ABNORMAL LOW (ref 39.0–52.0)
Hemoglobin: 8.7 g/dL — ABNORMAL LOW (ref 13.0–17.0)
MCH: 29.8 pg (ref 26.0–34.0)
MCHC: 32.5 g/dL (ref 30.0–36.0)
MCV: 91.8 fL (ref 80.0–100.0)
Platelets: 141 10*3/uL — ABNORMAL LOW (ref 150–400)
RBC: 2.92 MIL/uL — ABNORMAL LOW (ref 4.22–5.81)
RDW: 15 % (ref 11.5–15.5)
WBC: 20.6 10*3/uL — ABNORMAL HIGH (ref 4.0–10.5)
nRBC: 0 % (ref 0.0–0.2)

## 2022-12-28 LAB — URINALYSIS, ROUTINE W REFLEX MICROSCOPIC
Bilirubin Urine: NEGATIVE
Glucose, UA: NEGATIVE mg/dL
Hgb urine dipstick: NEGATIVE
Ketones, ur: NEGATIVE mg/dL
Leukocytes,Ua: NEGATIVE
Nitrite: NEGATIVE
Protein, ur: NEGATIVE mg/dL
Specific Gravity, Urine: 1.019 (ref 1.005–1.030)
pH: 5 (ref 5.0–8.0)

## 2022-12-28 LAB — MRSA NEXT GEN BY PCR, NASAL: MRSA by PCR Next Gen: DETECTED — AB

## 2022-12-28 LAB — LACTIC ACID, PLASMA: Lactic Acid, Venous: 1.6 mmol/L (ref 0.5–1.9)

## 2022-12-28 MED ORDER — MUPIROCIN 2 % EX OINT
1.0000 | TOPICAL_OINTMENT | Freq: Two times a day (BID) | CUTANEOUS | Status: DC
  Administered 2022-12-28 – 2022-12-29 (×4): 1 via NASAL
  Filled 2022-12-28: qty 22

## 2022-12-28 MED ORDER — DILTIAZEM HCL 30 MG PO TABS
15.0000 mg | ORAL_TABLET | Freq: Four times a day (QID) | ORAL | Status: DC
  Administered 2022-12-28: 15 mg via ORAL
  Filled 2022-12-28: qty 1

## 2022-12-28 MED ORDER — SODIUM CHLORIDE 0.9 % IV SOLN
2.0000 g | Freq: Two times a day (BID) | INTRAVENOUS | Status: DC
  Administered 2022-12-29: 2 g via INTRAVENOUS
  Filled 2022-12-28 (×2): qty 12.5

## 2022-12-28 MED ORDER — AMIODARONE HCL IN DEXTROSE 360-4.14 MG/200ML-% IV SOLN
60.0000 mg/h | INTRAVENOUS | Status: DC
  Administered 2022-12-28: 60 mg/h via INTRAVENOUS
  Filled 2022-12-28 (×2): qty 200

## 2022-12-28 MED ORDER — SODIUM CHLORIDE 0.9 % IV BOLUS
250.0000 mL | Freq: Once | INTRAVENOUS | Status: AC
  Administered 2022-12-28: 250 mL via INTRAVENOUS

## 2022-12-28 MED ORDER — FUROSEMIDE 10 MG/ML IJ SOLN
20.0000 mg | Freq: Once | INTRAMUSCULAR | Status: AC
  Administered 2022-12-28: 20 mg via INTRAVENOUS
  Filled 2022-12-28: qty 2

## 2022-12-28 MED ORDER — LACTATED RINGERS IV SOLN
150.0000 mL/h | INTRAVENOUS | Status: DC
  Administered 2022-12-28 (×2): 150 mL/h via INTRAVENOUS

## 2022-12-28 MED ORDER — AMIODARONE LOAD VIA INFUSION
150.0000 mg | Freq: Once | INTRAVENOUS | Status: AC
  Administered 2022-12-28: 150 mg via INTRAVENOUS
  Filled 2022-12-28: qty 83.34

## 2022-12-28 MED ORDER — METOPROLOL TARTRATE 25 MG PO TABS
25.0000 mg | ORAL_TABLET | Freq: Four times a day (QID) | ORAL | Status: DC
  Administered 2022-12-28: 25 mg via ORAL
  Filled 2022-12-28 (×2): qty 1

## 2022-12-28 MED ORDER — CHLORHEXIDINE GLUCONATE CLOTH 2 % EX PADS
6.0000 | MEDICATED_PAD | Freq: Every day | CUTANEOUS | Status: DC
  Administered 2022-12-27 – 2022-12-28 (×2): 6 via TOPICAL

## 2022-12-28 MED ORDER — AMIODARONE HCL IN DEXTROSE 360-4.14 MG/200ML-% IV SOLN: 60.0000 mg/h | INTRAVENOUS | Status: DC

## 2022-12-28 MED ORDER — AMIODARONE IV BOLUS ONLY 150 MG/100ML
150.0000 mg | Freq: Once | INTRAVENOUS | Status: AC
  Administered 2022-12-28: 150 mg via INTRAVENOUS
  Filled 2022-12-28: qty 100

## 2022-12-28 MED ORDER — AMIODARONE HCL IN DEXTROSE 360-4.14 MG/200ML-% IV SOLN: 30.0000 mg/h | INTRAVENOUS | Status: DC

## 2022-12-28 MED ORDER — VANCOMYCIN HCL 1500 MG/300ML IV SOLN
1500.0000 mg | Freq: Once | INTRAVENOUS | Status: AC
  Administered 2022-12-28: 1500 mg via INTRAVENOUS
  Filled 2022-12-28: qty 300

## 2022-12-28 MED ORDER — SODIUM CHLORIDE 0.9 % IV SOLN: INTRAVENOUS | Status: DC | PRN

## 2022-12-28 MED ORDER — SODIUM CHLORIDE 0.9 % IV SOLN
2.0000 g | Freq: Once | INTRAVENOUS | Status: AC
  Administered 2022-12-28: 2 g via INTRAVENOUS
  Filled 2022-12-28: qty 12.5

## 2022-12-28 MED ORDER — VANCOMYCIN VARIABLE DOSE PER UNSTABLE RENAL FUNCTION (PHARMACIST DOSING): Status: DC

## 2022-12-28 NOTE — Progress Notes (Signed)
Notified by lab that patients MRSA PCR was positive. Order entered for mupirocin ointment per policy.

## 2022-12-28 NOTE — Progress Notes (Signed)
Notified Dr. Joylene Igo of patients HR sustaining in the 130-140's and BP 76/54. Acknowledged; see new orders entered.

## 2022-12-28 NOTE — Consult Note (Addendum)
Pharmacy Antibiotic Note  Matthew Zamora is a 82 y.o. male admitted on 12/27/2022 with shortness of breath and chest pain admitted for acute on chronic HF exacerbation and Afib with RVR.  Medical history significant for HFrEF, COPD intermittently on home O2, A-fib.   12/28/22: patient with new fever of  103.3, SBP in 70-80s. Pharmacy has been consulted for Vancomycin and cefepime dosing for sepsis  Plan: Cefepime 2 gram Q12H Vancomycin 1500 mg LD x 1 and withhold maintenance regimen at this time as appears patient with AKI/CKD Vd 46.9, T1/2 ~ 23.1 hrs, Ke 0.030 Do not anticipate needing re-dosed for at least another 24 hours following LD Will order BMP for tomorrow AM and assess appropriateness for continuing given renal function vs changing to alternative agent such as linezolid   Height: 5\' 8"  (172.7 cm) Weight: 65.2 kg (143 lb 11.8 oz) IBW/kg (Calculated) : 68.4  Temp (24hrs), Avg:99.4 F (37.4 C), Min:98 F (36.7 C), Max:103.3 F (39.6 C)  Recent Labs  Lab 12/27/22 1216 12/28/22 0409  WBC 10.9* 20.6*  CREATININE 1.54* 1.76*    Estimated Creatinine Clearance: 30.9 mL/min (A) (by C-G formula based on SCr of 1.76 mg/dL (H)).    Allergies  Allergen Reactions   Nitrofuran Derivatives Other (See Comments)    Transaminitis ** confounded w/Amiodarone, Mexiletine   Spironolactone     Significant transaminitis     Antimicrobials this admission: 7/13 cefepime >>  7/13 Vancomycin >>   Dose adjustments this admission:   Microbiology results: 7/13 BCx: sent 7/13 Sputum: ordered  7/13 MRSA PCR: positive  Thank you for allowing pharmacy to be a part of this patient's care.  Sharen Hones, PharmD, BCPS Clinical Pharmacist   12/28/2022 1:42 PM

## 2022-12-28 NOTE — Plan of Care (Signed)
Patient alert and oriented X's4, admitted from home with AFib/RVR. Patient does have a defibrillator on left upper chest. C/o right hip pain on arrival, denies any falls.

## 2022-12-28 NOTE — Progress Notes (Signed)
PHARMACY - PHYSICIAN COMMUNICATION CRITICAL VALUE ALERT - BLOOD CULTURE IDENTIFICATION (BCID)  Matthew Zamora is an 82 y.o. male who presented to [facility] on 12/27/2022 with a chief complaint of Afib.  Assessment:  4/4 growing GPC.  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) detected  Name of physician (or Provider) Contacted: Webb Silversmith, NP  Current antibiotics: Vancomycin and Cefepime  Changes to prescribed antibiotics recommended:  Patient is on recommended antibiotics - No changes needed  Results for orders placed or performed during the hospital encounter of 12/27/22  Blood Culture ID Panel (Reflexed) (Collected: 12/28/2022 10:48 AM)  Result Value Ref Range   Enterococcus faecalis NOT DETECTED NOT DETECTED   Enterococcus Faecium NOT DETECTED NOT DETECTED   Listeria monocytogenes NOT DETECTED NOT DETECTED   Staphylococcus species DETECTED (A) NOT DETECTED   Staphylococcus aureus (BCID) DETECTED (A) NOT DETECTED   Staphylococcus epidermidis NOT DETECTED NOT DETECTED   Staphylococcus lugdunensis NOT DETECTED NOT DETECTED   Streptococcus species NOT DETECTED NOT DETECTED   Streptococcus agalactiae NOT DETECTED NOT DETECTED   Streptococcus pneumoniae NOT DETECTED NOT DETECTED   Streptococcus pyogenes NOT DETECTED NOT DETECTED   A.calcoaceticus-baumannii NOT DETECTED NOT DETECTED   Bacteroides fragilis NOT DETECTED NOT DETECTED   Enterobacterales NOT DETECTED NOT DETECTED   Enterobacter cloacae complex NOT DETECTED NOT DETECTED   Escherichia coli NOT DETECTED NOT DETECTED   Klebsiella aerogenes NOT DETECTED NOT DETECTED   Klebsiella oxytoca NOT DETECTED NOT DETECTED   Klebsiella pneumoniae NOT DETECTED NOT DETECTED   Proteus species NOT DETECTED NOT DETECTED   Salmonella species NOT DETECTED NOT DETECTED   Serratia marcescens NOT DETECTED NOT DETECTED   Haemophilus influenzae NOT DETECTED NOT DETECTED   Neisseria meningitidis NOT DETECTED NOT DETECTED    Pseudomonas aeruginosa NOT DETECTED NOT DETECTED   Stenotrophomonas maltophilia NOT DETECTED NOT DETECTED   Candida albicans NOT DETECTED NOT DETECTED   Candida auris NOT DETECTED NOT DETECTED   Candida glabrata NOT DETECTED NOT DETECTED   Candida krusei NOT DETECTED NOT DETECTED   Candida parapsilosis NOT DETECTED NOT DETECTED   Candida tropicalis NOT DETECTED NOT DETECTED   Cryptococcus neoformans/gattii NOT DETECTED NOT DETECTED   Meth resistant mecA/C and MREJ DETECTED (A) NOT DETECTED    Barrie Folk, PharmD 12/28/2022  9:50 PM

## 2022-12-28 NOTE — Sepsis Progress Note (Signed)
Sepsis protocol is being followed by [facility]. 

## 2022-12-28 NOTE — Progress Notes (Signed)
Dr. Joylene Igo notified of patients BP 86/49 (62), HR 89 and Cardizem gtt stopped per order. Verbal orders received for NS 250 ml bolus over 1 hour and Cardizem PO 15 mg q 6 hours hold for HR less than 60 or SBP less than 100. Read back and verified.

## 2022-12-28 NOTE — Sepsis Progress Note (Signed)
Fluids not given per sepsis protocol for SBP < 90 x2 d/t CHF exacerbation.

## 2022-12-28 NOTE — Progress Notes (Signed)
  Progress Note   Patient: Matthew Zamora:[id number] DOB: 03/11/1941 DOA: 12/27/2022     1 DOS: the patient was seen and examined on 12/28/2022   Brief hospital course: ATLEE JELKS is a 82 y.o. 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 Admitted to step down unit for Afib with RVR, acute on chronic systolic CHF exacerbation.   12/28/22 - Cardiology consulted advised Amiodarone drip. He spiked fever, has tachycardia, low BP. Sepsis protocol initiated. Avoid fluids due to his heart failure. Cultures, respiratory panel sent. Started Cefepime, Vanco.  Assessment and Plan: Sepsis unknown origin- Sepsis order set placed. IV fluids not started due to CHF presentation. Follow cultures, respiratory panel. Chest xray un remarkable. His BP seems chronically low, on midodrine therapy. Monitor vitals closely.  Acute on chronic combined systolic and diastolic CHF (congestive heart failure) [facility]) Cardiology on board advised 20 mg Lasix Monitor I's and O's and daily weights Telemetry Replete lites as needed   Acute on chronic respiratory failure with hypoxia ([name]) Currently on 4 L nasal cannula above his usual baseline at 3 L, wean as tolerated. Continue duonebs, diuresis as needed.   Atrial fibrillation with RVR ([name]) Hold diltiazem drip, Cardiology started Amiodarone drip. Continue Eliquis therapy.  COPD- Got solumedrol in ED. Continue duonebs. Taper oxygen to 3L.  Elevated troponin- Secondary to demand ischemia in the setting of sepsis, CHF, Afib      Subjective: Patient is seen and examined today morning. He is tachypneic, tachycardic, BP lower side. He spiked fever. Has moderate respiratory distress, able to answer me in one word.  Physical Exam: Vitals:   12/28/22 1100 12/28/22 1115 12/28/22 1130 12/28/22 1133  BP: 92/63 (!) 95/47 (!) 84/49 (!) 78/47  Pulse:  (!) 131 (!) 132 (!) 134 (!) 132  Resp: (!) 22 16 (!) 27 (!) 26  Temp:      TempSrc:      SpO2: 100% 100% 100% 100%  Weight:      Height:       General - Elderly Caucasian male, in moderate respiratory distress. HEENT - PERRLA, EOMI, atraumatic head, non tender sinuses. Lung - Clear, bibasilar Rales, diffuse rhonchi, using accessory muscles. Heart - S1, S2 heard, no murmurs, rubs, trace pedal edema. Abdomen soft, distended, bowel sounds good Neuro - Alert, awake and oriented, non focal exam. Skin - Warm and dry. Data Reviewed:  Results are pending, will review when available. CBC, BMP, Cultures.  Family Communication: Will discuss with his daughter in law over phone and update her.  Disposition: Status is: Inpatient Remains inpatient appropriate because: very sick, sepsis, afib, chf management  Planned Discharge Destination: Home with Home Health   MDM level 3- He remains sick with sepsis, Afib RVR, heart failure, respiratory failure. Work up pending. He is at high risk for clinical deterioration.  Author: Marcelino Duster, MD 12/28/2022 11:43 AM  For on call review [email or web address].

## 2022-12-28 NOTE — Consult Note (Signed)
Reason for consult: hypotension   82 year old male admitted on 7/12 with shortness of breath due to what looks to be acutely decompensated heart failure due to the profoundly high Pro BNP. Since then, his WBC has come back high at 20.6 and he spiked a fever to 103 this morning. CT scan shows no current evidence of pneumonia.   - tachycardia likely due to fluid low status and possible underlying sepsis? - agree with empiric antibiotics - follow up cultures - would consider judicious fluid resuscitation - agree with midodrine - can likely hold off on starting pressors - mentation is fine; and he is hypotensive at baseline - HOLD amio given persistent hypotension and HR being in low 100s - we will follow along with you closely  Rhea Bleacher MD Pulmonary & Critical Care Medicine

## 2022-12-28 NOTE — TOC Initial Note (Signed)
Transition of Care [facility]) - Initial/Assessment Note    Patient Details  Name: Matthew Zamora MRN: [id number] Date of Birth: August 24, 1940  Transition of Care [facility]) CM/SW Contact:    Liliana Cline, LCSW Phone Number: 12/28/2022, 9:52 AM  Clinical Narrative:                 Patient is known to [facility] for recent admission last week where he was discharged home with [facility]. Patient has also been to [facility] in the past. See below high risk assessment note completed by [facility] last week:   "Patient admits from home with [facility] via [facility] for PT/ RN per his son, Matthew Zamora.  Patient lives alone but his son lives directly next door.  His son Matthew Zamora and daughter in law check  on him twice daily and brings meals They also take him to appointments  Patient's daughter-in-law manages his medications.  He has a a walker, wheelchair, and a BSC.  He also has a privately purchased oxygen machine.  Matthew Zamora and his daughter-in-law advised of potential co-payment days. "  Expected Discharge Plan: Home w Home Health Services Barriers to Discharge: Continued Medical Work up   Patient Goals and CMS Choice            Expected Discharge Plan and Services       Living arrangements for the past 2 months: Single Family Home                                      Prior Living Arrangements/Services Living arrangements for the past 2 months: Single Family Home Lives with:: Self              Current home services: Home PT, Home RN, DME    Activities of Daily Living      Permission Sought/Granted                  Emotional Assessment              Admission diagnosis:  Atrial fibrillation with rapid ventricular response ([name]) [I48.91] Atrial fibrillation with RVR ([name]) [I48.91] Patient Active Problem List   Diagnosis Date Noted   Known medical problems 12/27/2022   Depression 12/17/2022   Protein-calorie malnutrition, moderate ([name]) 12/17/2022   Fall 11/06/2022    Closed displaced fracture of right femoral neck ([name]) 10/31/2022   Osteoporosis 10/30/2022   Femoral fracture ([name]) 10/30/2022   Epistaxis 05/12/2022   Protein-calorie malnutrition, severe 05/08/2022   Enteritis, enteropathogenic E. coli 04/29/2022   Diarrhea 04/28/2022   Multifocal pneumonia 04/23/2022   Erosive esophagitis 04/22/2022   Dizziness 04/21/2022   Intractable nausea and vomiting 04/21/2022   Dyspnea 04/20/2022   Hyponatremia 04/19/2022   Orthostatic hypotension 04/18/2022   Postural dizziness with near syncope 04/18/2022   Near syncope 04/17/2022   COPD (chronic obstructive pulmonary disease) ([name]) 04/17/2022   Hyperkalemia 04/17/2022   Abnormal LFTs 04/17/2022   Chronic systolic CHF (congestive heart failure) ([name]) 04/17/2022   Normocytic anemia 04/17/2022   GERD without esophagitis 02/12/2022   Recurrent falls    Generalized weakness    NSVT (nonsustained ventricular tachycardia) ([name]) 02/11/2022   Acute on chronic combined systolic (congestive) and diastolic (congestive) heart failure ([name]) 08/10/2021   Acute respiratory failure with hypoxia ([name])    HCAP (healthcare-associated pneumonia) 06/13/2021   Acute on chronic systolic CHF (congestive heart failure) ([name]) 06/13/2021  Elevated troponin 06/13/2021   CKD (chronic kidney disease), 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   Lung nodule 06/13/2021   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   COPD exacerbation ([name]) 06/13/2021   Volume depletion 12/15/2019   Hypotension 12/15/2019   Cardiorenal syndrome    Atrial fibrillation with RVR ([name])    Defibrillator discharge    Tachycardia 10/25/2019   CHF (congestive heart failure) ([name]) 09/18/2019   Acute on chronic combined systolic and diastolic CHF (congestive heart failure) ([name]) 09/17/2019   CAD (coronary artery disease) 09/17/2019   Ischemic cardiomyopathy 09/17/2019   CKD (chronic kidney disease) stage 3, GFR 30-59 ml/min ([name]) 09/17/2019   Acute hypoxemic respiratory failure ([name])  09/17/2018   AAA (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 ([name]) 07/06/2018   Essential hypertension 07/06/2018   Hyperlipidemia 07/06/2018   Paroxysmal atrial fibrillation ([name])    Acute on chronic respiratory failure with hypoxia ([name]) 04/26/2018   PCP:  Jerl Mina, MD Pharmacy:   [facility] [address]), [place] - [address] [place] ([place]) [place] [address] Phone: [phone] Fax: [phone]     Social Determinants of Health (SDOH) Social History: SDOH Screenings   Food Insecurity: No Food Insecurity (12/17/2022)  Housing: Low Risk  (12/17/2022)  Transportation Needs: No Transportation Needs (12/17/2022)  Utilities: Not At Risk (12/17/2022)  Financial Resource Strain: Low Risk  (12/05/2021)   Received from [facility], [facility]  Tobacco Use: Medium Risk (12/17/2022)   SDOH Interventions:     Readmission Risk Interventions    12/18/2022    4:18 PM 04/22/2022    3:54 PM  Readmission Risk Prevention Plan  Transportation Screening Complete Complete  Medication Review (RN Care Manager) Complete Complete  PCP or Specialist appointment within 3-5 days of discharge Complete Complete  HRI or Home Care Consult Complete Complete  SW Recovery Care/Counseling Consult Complete Complete  Palliative Care Screening Not Applicable Not Applicable  Skilled Nursing Facility Not Applicable Not Applicable

## 2022-12-28 NOTE — Consult Note (Signed)
Matthew Zamora is a 82 y.o. male  [id number]  Primary Cardiologist: [facility] cardiology Reason for Consultation: Atrial fibrillation and CHF  HPI: This 82 year old white male who presented to the hospital with shortness of breath and chest pain.  Patient has a prior history of HFrEF, atrial fibrillation, COPD, atrial fibrillation ; chronic hypotension and renal infarct insufficiency and I was asked to evaluate the patient.  He was started on IV amiodarone and was also taking metoprolol but blood pressure has been low and requiring midodrine.  Patient has probably sepsis with temperature being over 103.   Review of Systems: No orthopnea PND or leg swelling but patient has altered mental status.   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 pylori infection    History of hepatitis    Hyperlipidemia    Hypertension    Liver cyst 05/16/15   PAF (paroxysmal atrial fibrillation) ([name])    Prostate CA ([name])     Medications Prior to Admission  Medication Sig Dispense Refill   acetaminophen (TYLENOL) 325 MG tablet Take 2 tablets (650 mg total) by mouth every 4 (four) hours as needed for headache or mild pain.     albuterol (PROVENTIL) (2.5 MG/3ML) 0.083% nebulizer solution Inhale 3 mLs (2.5 mg total) into the lungs as needed for shortness of breath. 75 mL 12   ascorbic acid (VITAMIN C) 500 MG tablet Take 500 mg by mouth daily.     atorvastatin (LIPITOR) 80 MG tablet Take 80 mg by mouth daily.     calcium carbonate (TUMS EX) 750 MG chewable tablet Chew 1 tablet by mouth daily.     ELIQUIS 2.5 MG TABS tablet Take 2.5 mg by mouth 2 (two) times daily.      fluticasone-salmeterol (ADVAIR) 500-50 MCG/ACT AEPB Inhale 1 puff into the lungs in the morning and at bedtime.     gabapentin (NEURONTIN) 100 MG capsule Take 100 mg by mouth at bedtime.     guaiFENesin-dextromethorphan (ROBITUSSIN DM) 100-10 MG/5ML syrup Take 5 mLs by mouth every 4 (four) hours as needed for cough. 118 mL 0   magnesium oxide (MAG-OX) 400 (240 Mg) MG tablet Take 1 tablet by mouth daily.     melatonin 3 MG TABS tablet Take 9 mg by mouth at bedtime.     metoCLOPramide (REGLAN) 5 MG tablet Take 1 tablet (5 mg total) by mouth every 8 (eight) hours as needed for refractory nausea / vomiting.     metoprolol tartrate 37.5 MG TABS Take 1 tablet (37.5 mg total) by mouth 2 (two) times daily. 75 tablet 0   midodrine (PROAMATINE) 5 MG tablet Take 1 tablet (5 mg total) by mouth 3 (three) times daily with meals. (Patient taking differently: Take 30 mg by mouth 3 (three) times daily with meals.) 90 tablet 1   Multiple Vitamin (MULTIVITAMIN WITH MINERALS) TABS tablet Take 1 tablet by mouth daily.     ondansetron (ZOFRAN-ODT) 4 MG disintegrating tablet Take 4 mg by mouth every 8 (eight) hours as needed for refractory nausea / vomiting, vomiting or nausea.     pantoprazole (PROTONIX) 40 MG  tablet Take 1 tablet (40 mg total) by mouth 2 (two) times daily.     polyethylene glycol (MIRALAX / GLYCOLAX) 17 g packet Take 17 g by mouth 2 (two) times daily. 14 each 0   pyridostigmine (MESTINON) 60 MG tablet Take 1 tablet (60 mg total) by mouth every 8 (eight) hours.     sertraline (ZOLOFT) 25 MG tablet Take 25 mg by mouth daily.     sucralfate (CARAFATE) 1 GM/10ML suspension Take 10 mLs (1 g total) by mouth 4 (four) times daily -  with meals and at bedtime. 420 mL 0   tamsulosin (FLOMAX) 0.4 MG CAPS capsule Take 0.4 mg by mouth daily.     Nebulizer MISC 1 each by Does not apply route as needed. 1 each 0   Nutritional Supplements (,FEEDING SUPPLEMENT, PROSOURCE PLUS) liquid Take 30 mLs by mouth 3 (three) times  daily between meals.        (feeding supplement) PROSource Plus  30 mL Oral TID BM   apixaban  2.5 mg Oral BID   ascorbic acid  500 mg Oral Daily   atorvastatin  80 mg Oral Daily   calcium carbonate  750 mg Oral Daily   Chlorhexidine Gluconate Cloth  6 each Topical Daily   fluticasone furoate-vilanterol  1 puff Inhalation Daily   gabapentin  100 mg Oral QHS   magnesium oxide  400 mg Oral Daily   melatonin  10 mg Oral QHS   metoprolol tartrate  25 mg Oral Q6H   midodrine  30 mg Oral TID WC   multivitamin with minerals  1 tablet Oral Daily   mupirocin ointment  1 Application Nasal BID   pantoprazole  40 mg Oral BID   polyethylene glycol  17 g Oral BID   pyridostigmine  60 mg Oral Q8H   sertraline  25 mg Oral Daily   sodium chloride flush  3 mL Intravenous Q12H   sucralfate  1 g Oral TID WC & HS   tamsulosin  0.4 mg Oral Daily    Infusions:  amiodarone      Allergies  Allergen Reactions   Nitrofuran Derivatives Other (See Comments)    Transaminitis ** confounded w/Amiodarone, Mexiletine   Spironolactone     Significant transaminitis     Social History   Socioeconomic History   Marital status: Single    Spouse name: Not on file   Number of children: Not on file   Years of education: Not on file   Highest education level: Not on file  Occupational History   Not on file  Tobacco Use   Smoking status: Former    Current packs/day: 0.00    Average packs/day: 1 pack/day for 38.0 years (38.0 ttl pk-yrs)    Types: Cigarettes    Start date: 06/17/1961    Quit date: 06/18/1999    Years since quitting: 23.5   Smokeless tobacco: Never  Vaping Use   Vaping status: Never Used  Substance and Sexual Activity   Alcohol use: No    Alcohol/week: 0.0 standard drinks of alcohol   Drug use: Not Currently   Sexual activity: Not Currently  Other Topics Concern   Not on file  Social History Narrative   Not on file   Social Determinants of Health   Financial Resource Strain: Low  Risk  (12/05/2021)   [facility]   Overall Financial Resource Strain (CARDIA)    Difficulty of Paying Living Expenses: Not hard  at all  Food Insecurity: No Food Insecurity (12/17/2022)   Hunger Vital Sign    Worried About Running Out of Food in the Last Year: Never true    Ran Out of Food in the Last Year: Never true  Transportation Needs: No Transportation Needs (12/17/2022)   PRAPARE - Administrator, Civil Service (Medical): No    Lack of Transportation (Non-Medical): No  Physical Activity: Not on file  Stress: Not on file  Social Connections: Not on file  Intimate Partner Violence: Not At Risk (12/17/2022)   Humiliation, Afraid, Rape, and Kick questionnaire    Fear of Current or Ex-Partner: No    Emotionally Abused: No    Physically Abused: No    Sexually Abused: No    Family History  Problem Relation Age of Onset   Heart attack Mother    Heart attack Father     PHYSICAL EXAM: Vitals:   12/28/22 1200 12/28/22 1215  BP: (!) 70/44 (!) 77/46  Pulse: (!) 130 (!) 129  Resp: 11 11  Temp: 98.8 F (37.1 C)   SpO2: 100% 100%     Intake/Output Summary (Last 24 hours) at 12/28/2022 1308 Last data filed at 12/28/2022 1236 Gross per 24 hour  Intake 1664.18 ml  Output 1725 ml  Net -60.82 ml    General:  Well appearing. No respiratory difficulty HEENT: normal Neck: supple. no JVD. Carotids 2+ bilat; no bruits. No lymphadenopathy or thryomegaly appreciated. Cor: PMI nondisplaced. Regular rate & rhythm. No rubs, gallops or murmurs. Lungs: clear Abdomen: soft, nontender, nondistended. No hepatosplenomegaly. No bruits or masses. Good bowel sounds. Extremities: no cyanosis, clubbing, rash, edema Neuro: alert & oriented x 3, cranial nerves grossly intact. moves all 4 extremities w/o difficulty. Affect pleasant.  ECG: A-fib with rapid ventricular response rate nonspecific ST-T changes  Results for orders placed or performed during the  hospital encounter of 12/27/22 (from the past 24 hour(s))  Troponin I (High Sensitivity)     Status: Abnormal   Collection Time: 12/27/22  2:16 PM  Result Value Ref Range   Troponin I (High Sensitivity) 28 (H) <18 ng/L  Glucose, capillary     Status: Abnormal   Collection Time: 12/27/22  8:08 PM  Result Value Ref Range   Glucose-Capillary 115 (H) 70 - 99 mg/dL  Troponin I (High Sensitivity)     Status: Abnormal   Collection Time: 12/27/22  9:38 PM  Result Value Ref Range   Troponin I (High Sensitivity) 30 (H) <18 ng/L  MRSA Next Gen by PCR, Nasal     Status: Abnormal   Collection Time: 12/27/22  9:38 PM   Specimen: Nasal Mucosa; Nasal Swab  Result Value Ref Range   MRSA by PCR Next Gen DETECTED (A) NOT DETECTED  Comprehensive metabolic panel     Status: Abnormal   Collection Time: 12/28/22  4:09 AM  Result Value Ref Range   Sodium 134 (L) 135 - 145 mmol/L   Potassium 4.1 3.5 - 5.1 mmol/L   Chloride 103 98 - 111 mmol/L   CO2 24 22 - 32 mmol/L   Glucose, Bld 102 (H) 70 - 99 mg/dL   BUN 29 (H) 8 - 23 mg/dL   Creatinine, Ser 6.04 (H) 0.61 - 1.24 mg/dL   Calcium 8.4 (L) 8.9 - 10.3 mg/dL   Total Protein 5.7 (L) 6.5 - 8.1 g/dL   Albumin 3.3 (L) 3.5 - 5.0 g/dL   AST 22 15 - 41 U/L   ALT  21 0 - 44 U/L   Alkaline Phosphatase 66 38 - 126 U/L   Total Bilirubin 0.9 0.3 - 1.2 mg/dL   GFR, Estimated 39 (L) >60 mL/min   Anion gap 7 5 - 15  CBC     Status: Abnormal   Collection Time: 12/28/22  4:09 AM  Result Value Ref Range   WBC 20.6 (H) 4.0 - 10.5 K/uL   RBC 2.92 (L) 4.22 - 5.81 MIL/uL   Hemoglobin 8.7 (L) 13.0 - 17.0 g/dL   HCT 60.6 (L) 30.1 - 60.1 %   MCV 91.8 80.0 - 100.0 fL   MCH 29.8 26.0 - 34.0 pg   MCHC 32.5 30.0 - 36.0 g/dL   RDW 09.3 23.5 - 57.3 %   Platelets 141 (L) 150 - 400 K/uL   nRBC 0.0 0.0 - 0.2 %   CT Angio Chest PE W and/or Wo Contrast  Result Date: 12/27/2022 CLINICAL DATA:  Pulmonary embolus suspected with high probability. Shortness of breath. COPD. EXAM:  CT ANGIOGRAPHY CHEST WITH CONTRAST TECHNIQUE: Multidetector CT imaging of the chest was performed using the standard protocol during bolus administration of intravenous contrast. Multiplanar CT image reconstructions and MIPs were obtained to evaluate the vascular anatomy. RADIATION DOSE REDUCTION: This exam was performed according to the departmental dose-optimization program which includes automated exposure control, adjustment of the mA and/or kV according to patient size and/or use of iterative reconstruction technique. CONTRAST:  75mL OMNIPAQUE IOHEXOL 350 MG/ML SOLN COMPARISON:  Chest radiograph 12/17/2022.  CT 11/10/2022. FINDINGS: Cardiovascular: Good opacification of the central and segmental pulmonary arteries resulting in technically adequate study. No focal filling defects. No evidence of significant pulmonary embolus. Normal heart size. No pericardial effusions. Normal caliber thoracic aorta. Calcification of the aorta and coronary arteries. Mediastinum/Nodes: Thyroid gland is unremarkable. Esophagus is decompressed. No significant lymphadenopathy. Lungs/Pleura: Emphysematous changes and scattered fibrosis in the lungs. Bronchial wall thickening, subpleural fibrosis and ground-glass changes likely representing changes of respiratory bronchiolitis. No pleural effusions. No pneumothorax. Upper Abdomen: Cholelithiasis with small stone in the gallbladder. No inflammatory infiltration demonstrated. Mass in the inferior right lobe of the liver measuring 4.9 x 5.5 cm in diameter. This has been present on previous studies including CT abdomen and pelvis from 03/27/2022 and dating back to 05/16/2015. Lesion is slowly growing but long-term presence of the lesion suggest likely benign etiology. This could represent a biliary cystadenoma. The lesion is incompletely visualized and not well characterized on this study which was optimized for evaluation of pulmonary arteries. Musculoskeletal: No acute bony  abnormalities. Old right rib fractures. Review of the MIP images confirms the above findings. IMPRESSION: 1. No evidence of significant pulmonary embolus. 2. 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 3. Cholelithiasis without inflammatory change. 4. Cystic lesion in the liver is incompletely visualized but present on prior studies. Likely benign. 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 5. Aortic atherosclerosis. Electronically Signed   By: Burman Nieves M.D.   On: 12/27/2022 17:48   DG Chest 2 View  Result Date: 12/27/2022 CLINICAL DATA:  Shortness of breath EXAM: CHEST - 2 VIEW COMPARISON:  Previous studies including the examination of 12/17/2022 FINDINGS: Transverse diameter of heart is increased. Increase in AP diameter of chest suggests COPD. Thoracic aorta is tortuous and ectatic. There are no signs of pulmonary edema or focal pulmonary consolidation. There is interval clearing of vascular congestion. There is no pleural effusion or pneumothorax. Pacemaker/defibrillator battery is seen in the left infraclavicular region. IMPRESSION: Cardiomegaly. There are no signs  of pulmonary edema or focal pulmonary consolidation. Electronically Signed   By: Ernie Avena M.D.   On: 12/27/2022 12:35     ASSESSMENT AND PLAN: Atrial fibrillation with rapid ventricular response rate and hypotension probably related to septic shock.  Blood cultures are pending.  Advise getting critical care consult and may require pressors.  Will be difficult to control ventricular rate while patient is septic.  May consider p.o. amiodarone since IV amiodarone will cause hypotension.  Digoxin 0.125 periodically can be given IV.  Will get echocardiogram to further evaluate ejection fraction.  Lakenya Riendeau Welton Flakes

## 2022-12-29 ENCOUNTER — Other Ambulatory Visit (HOSPITAL_COMMUNITY): Payer: Self-pay | Admitting: Interventional Radiology

## 2022-12-29 ENCOUNTER — Inpatient Hospital Stay (HOSPITAL_COMMUNITY)
Admission: EM | Admit: 2022-12-29 | Discharge: 2023-01-16 | DRG: 023 | Disposition: A | Discharge status: Hospice | Payer: Medicare Other | Source: Other Acute Inpatient Hospital | Attending: Internal Medicine | Admitting: Internal Medicine

## 2022-12-29 ENCOUNTER — Inpatient Hospital Stay (HOSPITAL_COMMUNITY): Payer: Medicare Other

## 2022-12-29 ENCOUNTER — Encounter: Payer: Self-pay | Admitting: Family Medicine

## 2022-12-29 ENCOUNTER — Inpatient Hospital Stay (HOSPITAL_COMMUNITY)
Admit: 2022-12-29 | Discharge: 2022-12-29 | Disposition: A | Discharge status: Home / Self Care | Payer: Medicare Other | Attending: Nurse Practitioner | Admitting: Nurse Practitioner

## 2022-12-29 ENCOUNTER — Encounter (HOSPITAL_COMMUNITY): Payer: Self-pay

## 2022-12-29 ENCOUNTER — Inpatient Hospital Stay (HOSPITAL_COMMUNITY): Payer: Medicare Other | Admitting: Certified Registered Nurse Anesthetist

## 2022-12-29 ENCOUNTER — Inpatient Hospital Stay: Payer: Medicare Other

## 2022-12-29 ENCOUNTER — Encounter (HOSPITAL_COMMUNITY)
Admission: EM | Disposition: A | Discharge status: Hospice | Payer: Self-pay | Source: Other Acute Inpatient Hospital | Attending: Internal Medicine

## 2022-12-29 ENCOUNTER — Other Ambulatory Visit: Payer: Self-pay

## 2022-12-29 DIAGNOSIS — I5021 Acute systolic (congestive) heart failure: Secondary | ICD-10-CM | POA: Diagnosis not present

## 2022-12-29 DIAGNOSIS — A4102 Sepsis due to Methicillin resistant Staphylococcus aureus: Secondary | ICD-10-CM | POA: Diagnosis present

## 2022-12-29 DIAGNOSIS — Z7951 Long term (current) use of inhaled steroids: Secondary | ICD-10-CM

## 2022-12-29 DIAGNOSIS — F32A Depression, unspecified: Secondary | ICD-10-CM | POA: Diagnosis present

## 2022-12-29 DIAGNOSIS — M25511 Pain in right shoulder: Secondary | ICD-10-CM | POA: Diagnosis not present

## 2022-12-29 DIAGNOSIS — I6522 Occlusion and stenosis of left carotid artery: Secondary | ICD-10-CM

## 2022-12-29 DIAGNOSIS — R7881 Bacteremia: Principal | ICD-10-CM | POA: Diagnosis present

## 2022-12-29 DIAGNOSIS — N170 Acute kidney failure with tubular necrosis: Secondary | ICD-10-CM | POA: Diagnosis present

## 2022-12-29 DIAGNOSIS — Z96641 Presence of right artificial hip joint: Secondary | ICD-10-CM | POA: Diagnosis present

## 2022-12-29 DIAGNOSIS — D6959 Other secondary thrombocytopenia: Secondary | ICD-10-CM | POA: Diagnosis present

## 2022-12-29 DIAGNOSIS — I130 Hypertensive heart and chronic kidney disease with heart failure and stage 1 through stage 4 chronic kidney disease, or unspecified chronic kidney disease: Secondary | ICD-10-CM | POA: Diagnosis present

## 2022-12-29 DIAGNOSIS — Y831 Surgical operation with implant of artificial internal device as the cause of abnormal reaction of the patient, or of later complication, without mention of misadventure at the time of the procedure: Secondary | ICD-10-CM | POA: Diagnosis present

## 2022-12-29 DIAGNOSIS — I63232 Cerebral infarction due to unspecified occlusion or stenosis of left carotid arteries: Secondary | ICD-10-CM | POA: Diagnosis not present

## 2022-12-29 DIAGNOSIS — E871 Hypo-osmolality and hyponatremia: Secondary | ICD-10-CM | POA: Diagnosis present

## 2022-12-29 DIAGNOSIS — D649 Anemia, unspecified: Secondary | ICD-10-CM | POA: Diagnosis not present

## 2022-12-29 DIAGNOSIS — Z7982 Long term (current) use of aspirin: Secondary | ICD-10-CM

## 2022-12-29 DIAGNOSIS — I509 Heart failure, unspecified: Secondary | ICD-10-CM | POA: Diagnosis not present

## 2022-12-29 DIAGNOSIS — Z6823 Body mass index (BMI) 23.0-23.9, adult: Secondary | ICD-10-CM

## 2022-12-29 DIAGNOSIS — R079 Chest pain, unspecified: Secondary | ICD-10-CM | POA: Diagnosis not present

## 2022-12-29 DIAGNOSIS — Z87891 Personal history of nicotine dependence: Secondary | ICD-10-CM

## 2022-12-29 DIAGNOSIS — Z8249 Family history of ischemic heart disease and other diseases of the circulatory system: Secondary | ICD-10-CM

## 2022-12-29 DIAGNOSIS — I4892 Unspecified atrial flutter: Secondary | ICD-10-CM | POA: Diagnosis present

## 2022-12-29 DIAGNOSIS — E875 Hyperkalemia: Secondary | ICD-10-CM | POA: Diagnosis not present

## 2022-12-29 DIAGNOSIS — I639 Cerebral infarction, unspecified: Secondary | ICD-10-CM

## 2022-12-29 DIAGNOSIS — I5022 Chronic systolic (congestive) heart failure: Secondary | ICD-10-CM | POA: Diagnosis not present

## 2022-12-29 DIAGNOSIS — F05 Delirium due to known physiological condition: Secondary | ICD-10-CM | POA: Diagnosis not present

## 2022-12-29 DIAGNOSIS — I472 Ventricular tachycardia, unspecified: Secondary | ICD-10-CM | POA: Diagnosis not present

## 2022-12-29 DIAGNOSIS — B9562 Methicillin resistant Staphylococcus aureus infection as the cause of diseases classified elsewhere: Secondary | ICD-10-CM | POA: Diagnosis not present

## 2022-12-29 DIAGNOSIS — I5043 Acute on chronic combined systolic (congestive) and diastolic (congestive) heart failure: Secondary | ICD-10-CM | POA: Diagnosis present

## 2022-12-29 DIAGNOSIS — L89221 Pressure ulcer of left hip, stage 1: Secondary | ICD-10-CM | POA: Diagnosis not present

## 2022-12-29 DIAGNOSIS — H5340 Unspecified visual field defects: Secondary | ICD-10-CM | POA: Diagnosis present

## 2022-12-29 DIAGNOSIS — I951 Orthostatic hypotension: Secondary | ICD-10-CM | POA: Diagnosis present

## 2022-12-29 DIAGNOSIS — Z789 Other specified health status: Secondary | ICD-10-CM | POA: Diagnosis not present

## 2022-12-29 DIAGNOSIS — Z7189 Other specified counseling: Secondary | ICD-10-CM | POA: Diagnosis not present

## 2022-12-29 DIAGNOSIS — K921 Melena: Secondary | ICD-10-CM | POA: Diagnosis not present

## 2022-12-29 DIAGNOSIS — I480 Paroxysmal atrial fibrillation: Secondary | ICD-10-CM | POA: Diagnosis present

## 2022-12-29 DIAGNOSIS — Z8619 Personal history of other infectious and parasitic diseases: Secondary | ICD-10-CM

## 2022-12-29 DIAGNOSIS — R4701 Aphasia: Secondary | ICD-10-CM | POA: Diagnosis present

## 2022-12-29 DIAGNOSIS — E872 Acidosis, unspecified: Secondary | ICD-10-CM | POA: Diagnosis present

## 2022-12-29 DIAGNOSIS — E43 Unspecified severe protein-calorie malnutrition: Secondary | ICD-10-CM | POA: Diagnosis present

## 2022-12-29 DIAGNOSIS — I4891 Unspecified atrial fibrillation: Secondary | ICD-10-CM | POA: Diagnosis not present

## 2022-12-29 DIAGNOSIS — I110 Hypertensive heart disease with heart failure: Secondary | ICD-10-CM

## 2022-12-29 DIAGNOSIS — Z1152 Encounter for screening for COVID-19: Secondary | ICD-10-CM

## 2022-12-29 DIAGNOSIS — J449 Chronic obstructive pulmonary disease, unspecified: Secondary | ICD-10-CM | POA: Diagnosis present

## 2022-12-29 DIAGNOSIS — E162 Hypoglycemia, unspecified: Secondary | ICD-10-CM | POA: Diagnosis present

## 2022-12-29 DIAGNOSIS — Z888 Allergy status to other drugs, medicaments and biological substances status: Secondary | ICD-10-CM

## 2022-12-29 DIAGNOSIS — F419 Anxiety disorder, unspecified: Secondary | ICD-10-CM | POA: Diagnosis present

## 2022-12-29 DIAGNOSIS — I63032 Cerebral infarction due to thrombosis of left carotid artery: Secondary | ICD-10-CM | POA: Diagnosis present

## 2022-12-29 DIAGNOSIS — B348 Other viral infections of unspecified site: Secondary | ICD-10-CM | POA: Diagnosis not present

## 2022-12-29 DIAGNOSIS — I959 Hypotension, unspecified: Secondary | ICD-10-CM | POA: Diagnosis not present

## 2022-12-29 DIAGNOSIS — G9341 Metabolic encephalopathy: Secondary | ICD-10-CM | POA: Diagnosis present

## 2022-12-29 DIAGNOSIS — K219 Gastro-esophageal reflux disease without esophagitis: Secondary | ICD-10-CM | POA: Diagnosis not present

## 2022-12-29 DIAGNOSIS — M4802 Spinal stenosis, cervical region: Secondary | ICD-10-CM | POA: Diagnosis present

## 2022-12-29 DIAGNOSIS — D638 Anemia in other chronic diseases classified elsewhere: Secondary | ICD-10-CM | POA: Diagnosis present

## 2022-12-29 DIAGNOSIS — L8990 Pressure ulcer of unspecified site, unspecified stage: Secondary | ICD-10-CM | POA: Insufficient documentation

## 2022-12-29 DIAGNOSIS — Z95810 Presence of automatic (implantable) cardiac defibrillator: Secondary | ICD-10-CM

## 2022-12-29 DIAGNOSIS — K2270 Barrett's esophagus without dysplasia: Secondary | ICD-10-CM | POA: Diagnosis present

## 2022-12-29 DIAGNOSIS — J9621 Acute and chronic respiratory failure with hypoxia: Secondary | ICD-10-CM | POA: Diagnosis not present

## 2022-12-29 DIAGNOSIS — R6521 Severe sepsis with septic shock: Secondary | ICD-10-CM | POA: Diagnosis present

## 2022-12-29 DIAGNOSIS — N1832 Chronic kidney disease, stage 3b: Secondary | ICD-10-CM | POA: Diagnosis present

## 2022-12-29 DIAGNOSIS — Z9181 History of falling: Secondary | ICD-10-CM

## 2022-12-29 DIAGNOSIS — M542 Cervicalgia: Secondary | ICD-10-CM | POA: Diagnosis not present

## 2022-12-29 DIAGNOSIS — I8290 Acute embolism and thrombosis of unspecified vein: Secondary | ICD-10-CM

## 2022-12-29 DIAGNOSIS — G8191 Hemiplegia, unspecified affecting right dominant side: Secondary | ICD-10-CM | POA: Diagnosis present

## 2022-12-29 DIAGNOSIS — R627 Adult failure to thrive: Secondary | ICD-10-CM | POA: Diagnosis not present

## 2022-12-29 DIAGNOSIS — Z7901 Long term (current) use of anticoagulants: Secondary | ICD-10-CM

## 2022-12-29 DIAGNOSIS — Z515 Encounter for palliative care: Secondary | ICD-10-CM

## 2022-12-29 DIAGNOSIS — T827XXA Infection and inflammatory reaction due to other cardiac and vascular devices, implants and grafts, initial encounter: Secondary | ICD-10-CM | POA: Diagnosis present

## 2022-12-29 DIAGNOSIS — M25512 Pain in left shoulder: Secondary | ICD-10-CM | POA: Diagnosis present

## 2022-12-29 DIAGNOSIS — I2510 Atherosclerotic heart disease of native coronary artery without angina pectoris: Secondary | ICD-10-CM | POA: Diagnosis present

## 2022-12-29 DIAGNOSIS — Z86010 Personal history of colonic polyps: Secondary | ICD-10-CM

## 2022-12-29 DIAGNOSIS — E785 Hyperlipidemia, unspecified: Secondary | ICD-10-CM | POA: Diagnosis present

## 2022-12-29 DIAGNOSIS — R29810 Facial weakness: Secondary | ICD-10-CM | POA: Diagnosis present

## 2022-12-29 DIAGNOSIS — R4702 Dysphasia: Secondary | ICD-10-CM | POA: Diagnosis present

## 2022-12-29 DIAGNOSIS — Z8679 Personal history of other diseases of the circulatory system: Secondary | ICD-10-CM

## 2022-12-29 DIAGNOSIS — R531 Weakness: Secondary | ICD-10-CM | POA: Diagnosis not present

## 2022-12-29 DIAGNOSIS — Z9981 Dependence on supplemental oxygen: Secondary | ICD-10-CM

## 2022-12-29 DIAGNOSIS — Z9861 Coronary angioplasty status: Secondary | ICD-10-CM

## 2022-12-29 DIAGNOSIS — Z66 Do not resuscitate: Secondary | ICD-10-CM | POA: Diagnosis not present

## 2022-12-29 DIAGNOSIS — I493 Ventricular premature depolarization: Secondary | ICD-10-CM | POA: Diagnosis not present

## 2022-12-29 DIAGNOSIS — Z79899 Other long term (current) drug therapy: Secondary | ICD-10-CM

## 2022-12-29 DIAGNOSIS — R29712 NIHSS score 12: Secondary | ICD-10-CM | POA: Diagnosis present

## 2022-12-29 DIAGNOSIS — R54 Age-related physical debility: Secondary | ICD-10-CM | POA: Diagnosis present

## 2022-12-29 DIAGNOSIS — B9789 Other viral agents as the cause of diseases classified elsewhere: Secondary | ICD-10-CM | POA: Diagnosis present

## 2022-12-29 DIAGNOSIS — Z8546 Personal history of malignant neoplasm of prostate: Secondary | ICD-10-CM

## 2022-12-29 HISTORY — PX: IR PERCUTANEOUS ART THROMBECTOMY/INFUSION INTRACRANIAL INC DIAG ANGIO: IMG6087

## 2022-12-29 HISTORY — PX: RADIOLOGY WITH ANESTHESIA: SHX6223

## 2022-12-29 HISTORY — PX: IR CT HEAD LTD: IMG2386

## 2022-12-29 LAB — COMPREHENSIVE METABOLIC PANEL
ALT: 20 U/L (ref 0–44)
AST: 28 U/L (ref 15–41)
Albumin: 2.8 g/dL — ABNORMAL LOW (ref 3.5–5.0)
Alkaline Phosphatase: 53 U/L (ref 38–126)
Anion gap: 9 (ref 5–15)
BUN: 39 mg/dL — ABNORMAL HIGH (ref 8–23)
CO2: 21 mmol/L — ABNORMAL LOW (ref 22–32)
Calcium: 7.7 mg/dL — ABNORMAL LOW (ref 8.9–10.3)
Chloride: 102 mmol/L (ref 98–111)
Creatinine, Ser: 2.12 mg/dL — ABNORMAL HIGH (ref 0.61–1.24)
GFR, Estimated: 31 mL/min — ABNORMAL LOW (ref >60)
Glucose, Bld: 97 mg/dL (ref 70–99)
Potassium: 3.7 mmol/L (ref 3.5–5.1)
Sodium: 132 mmol/L — ABNORMAL LOW (ref 135–145)
Total Bilirubin: 0.9 mg/dL (ref 0.3–1.2)
Total Protein: 5.1 g/dL — ABNORMAL LOW (ref 6.5–8.1)

## 2022-12-29 LAB — HEMOGLOBIN A1C
Hgb A1c MFr Bld: 5.4 % (ref 4.8–5.6)
Mean Plasma Glucose: 108.28 mg/dL

## 2022-12-29 LAB — CBC
HCT: 26.2 % — ABNORMAL LOW (ref 39.0–52.0)
Hemoglobin: 8.4 g/dL — ABNORMAL LOW (ref 13.0–17.0)
MCH: 29.7 pg (ref 26.0–34.0)
MCHC: 32.1 g/dL (ref 30.0–36.0)
MCV: 92.6 fL (ref 80.0–100.0)
Platelets: 109 10*3/uL — ABNORMAL LOW (ref 150–400)
RBC: 2.83 MIL/uL — ABNORMAL LOW (ref 4.22–5.81)
RDW: 14.9 % (ref 11.5–15.5)
WBC: 12.9 10*3/uL — ABNORMAL HIGH (ref 4.0–10.5)
nRBC: 0 % (ref 0.0–0.2)

## 2022-12-29 LAB — BASIC METABOLIC PANEL
Anion gap: 12 (ref 5–15)
BUN: 33 mg/dL — ABNORMAL HIGH (ref 8–23)
CO2: 20 mmol/L — ABNORMAL LOW (ref 22–32)
Calcium: 8.1 mg/dL — ABNORMAL LOW (ref 8.9–10.3)
Chloride: 105 mmol/L (ref 98–111)
Creatinine, Ser: 1.86 mg/dL — ABNORMAL HIGH (ref 0.61–1.24)
GFR, Estimated: 36 mL/min — ABNORMAL LOW (ref >60)
Glucose, Bld: 91 mg/dL (ref 70–99)
Potassium: 3.7 mmol/L (ref 3.5–5.1)
Sodium: 137 mmol/L (ref 135–145)

## 2022-12-29 LAB — MRSA NEXT GEN BY PCR, NASAL: MRSA by PCR Next Gen: DETECTED — AB

## 2022-12-29 LAB — ECHOCARDIOGRAM COMPLETE
AV Peak grad: 7.2 mmHg
Ao pk vel: 1.34 m/s
Area-P 1/2: 6.02 cm2
Height: 68 in
S' Lateral: 4.4 cm
Single Plane A4C EF: 34.5 %
Weight: 2299.84 oz

## 2022-12-29 LAB — GLUCOSE, CAPILLARY
Glucose-Capillary: 107 mg/dL — ABNORMAL HIGH (ref 70–99)
Glucose-Capillary: 64 mg/dL — ABNORMAL LOW (ref 70–99)
Glucose-Capillary: 93 mg/dL (ref 70–99)
Glucose-Capillary: 77 mg/dL (ref 70–99)
Glucose-Capillary: 103 mg/dL — ABNORMAL HIGH (ref 70–99)
Glucose-Capillary: 96 mg/dL (ref 70–99)

## 2022-12-29 LAB — SARS CORONAVIRUS 2 BY RT PCR: SARS Coronavirus 2 by RT PCR: NEGATIVE

## 2022-12-29 LAB — PROCALCITONIN: Procalcitonin: 5.58 ng/mL

## 2022-12-29 LAB — TROPONIN I (HIGH SENSITIVITY): Troponin I (High Sensitivity): 38 ng/L — ABNORMAL HIGH (ref <18)

## 2022-12-29 LAB — PHOSPHORUS: Phosphorus: 3 mg/dL (ref 2.5–4.6)

## 2022-12-29 LAB — MAGNESIUM: Magnesium: 1.6 mg/dL — ABNORMAL LOW (ref 1.7–2.4)

## 2022-12-29 LAB — VANCOMYCIN, RANDOM: Vancomycin Rm: 10 ug/mL

## 2022-12-29 SURGERY — IR WITH ANESTHESIA
Anesthesia: General

## 2022-12-29 MED ORDER — NITROGLYCERIN 1 MG/10 ML FOR IR/CATH LAB
INTRA_ARTERIAL | Status: AC
  Filled 2022-12-29: qty 10

## 2022-12-29 MED ORDER — PHENYLEPHRINE HCL-NACL 20-0.9 MG/250ML-% IV SOLN
INTRAVENOUS | Status: DC | PRN
  Administered 2022-12-29: 50 ug/min via INTRAVENOUS

## 2022-12-29 MED ORDER — DEXTROSE 5 % IV SOLN: INTRAVENOUS | Status: AC

## 2022-12-29 MED ORDER — DIGOXIN 0.25 MG/ML IJ SOLN
0.2500 mg | Freq: Once | INTRAMUSCULAR | Status: AC
  Administered 2022-12-29: 0.25 mg via INTRAVENOUS
  Filled 2022-12-29: qty 2

## 2022-12-29 MED ORDER — SUGAMMADEX SODIUM 200 MG/2ML IV SOLN
INTRAVENOUS | Status: DC | PRN
  Administered 2022-12-29: 200 mg via INTRAVENOUS

## 2022-12-29 MED ORDER — SUCCINYLCHOLINE CHLORIDE 200 MG/10ML IV SOSY
PREFILLED_SYRINGE | INTRAVENOUS | Status: DC | PRN
  Administered 2022-12-29: 100 mg via INTRAVENOUS

## 2022-12-29 MED ORDER — DEXMEDETOMIDINE HCL IN NACL 400 MCG/100ML IV SOLN
INTRAVENOUS | Status: AC
  Administered 2022-12-29: 0.2 ug/kg/h via INTRAVENOUS
  Filled 2022-12-29: qty 100

## 2022-12-29 MED ORDER — FENTANYL CITRATE (PF) 100 MCG/2ML IJ SOLN
INTRAMUSCULAR | Status: AC
  Filled 2022-12-29: qty 2

## 2022-12-29 MED ORDER — LIDOCAINE 2% (20 MG/ML) 5 ML SYRINGE
INTRAMUSCULAR | Status: DC | PRN
  Administered 2022-12-29: 100 mg via INTRAVENOUS

## 2022-12-29 MED ORDER — SUCRALFATE 1 GM/10ML PO SUSP
1.0000 g | Freq: Three times a day (TID) | ORAL | Status: DC
  Administered 2022-12-30 – 2023-01-16 (×59): 1 g via ORAL
  Filled 2022-12-29 (×61): qty 10

## 2022-12-29 MED ORDER — SODIUM CHLORIDE 0.9 % IV SOLN: INTRAVENOUS | Status: DC

## 2022-12-29 MED ORDER — AMIODARONE LOAD VIA INFUSION
150.0000 mg | Freq: Once | INTRAVENOUS | Status: AC
  Administered 2022-12-29: 150 mg via INTRAVENOUS
  Filled 2022-12-29: qty 83.34

## 2022-12-29 MED ORDER — LORAZEPAM 2 MG/ML IJ SOLN
2.0000 mg | INTRAMUSCULAR | Status: AC
  Administered 2022-12-29: 2 mg via INTRAVENOUS

## 2022-12-29 MED ORDER — SERTRALINE HCL 25 MG PO TABS
25.0000 mg | ORAL_TABLET | Freq: Every day | ORAL | Status: DC
  Administered 2022-12-30 – 2023-01-15 (×16): 25 mg via ORAL
  Filled 2022-12-29 (×16): qty 1

## 2022-12-29 MED ORDER — AMIODARONE IV BOLUS ONLY 150 MG/100ML: 150.0000 mg | Freq: Once | INTRAVENOUS | Status: DC

## 2022-12-29 MED ORDER — ACETAMINOPHEN 325 MG PO TABS
650.0000 mg | ORAL_TABLET | ORAL | Status: DC | PRN
  Administered 2022-12-30 – 2023-01-02 (×6): 650 mg via ORAL
  Filled 2022-12-29 (×7): qty 2

## 2022-12-29 MED ORDER — POLYETHYLENE GLYCOL 3350 17 G PO PACK: 17.0000 g | PACK | Freq: Every day | ORAL | Status: DC | PRN

## 2022-12-29 MED ORDER — ROCURONIUM BROMIDE 10 MG/ML (PF) SYRINGE
PREFILLED_SYRINGE | INTRAVENOUS | Status: DC | PRN
  Administered 2022-12-29: 50 mg via INTRAVENOUS

## 2022-12-29 MED ORDER — SODIUM CHLORIDE 0.9 % IV SOLN
INTRAVENOUS | Status: AC | PRN
  Administered 2022-12-29: 2 ug/kg/min via INTRAVENOUS

## 2022-12-29 MED ORDER — FENTANYL CITRATE (PF) 250 MCG/5ML IJ SOLN
INTRAMUSCULAR | Status: DC | PRN
  Administered 2022-12-29: 50 ug via INTRAVENOUS
  Administered 2022-12-29 (×2): 25 ug via INTRAVENOUS

## 2022-12-29 MED ORDER — TICAGRELOR 90 MG PO TABS
ORAL_TABLET | ORAL | Status: AC
  Filled 2022-12-29: qty 1

## 2022-12-29 MED ORDER — ASPIRIN 81 MG PO CHEW: 81.0000 mg | CHEWABLE_TABLET | Freq: Every day | ORAL | Status: DC

## 2022-12-29 MED ORDER — ASPIRIN 81 MG PO CHEW
CHEWABLE_TABLET | ORAL | Status: AC
  Filled 2022-12-29: qty 1

## 2022-12-29 MED ORDER — NOREPINEPHRINE 4 MG/250ML-% IV SOLN
INTRAVENOUS | Status: DC | PRN
  Administered 2022-12-29: 5 ug/min via INTRAVENOUS

## 2022-12-29 MED ORDER — DEXTROSE 50 % IV SOLN
INTRAVENOUS | Status: AC
  Filled 2022-12-29: qty 50

## 2022-12-29 MED ORDER — ATORVASTATIN CALCIUM 80 MG PO TABS
80.0000 mg | ORAL_TABLET | Freq: Every day | ORAL | Status: DC
  Administered 2022-12-30 – 2023-01-06 (×8): 80 mg via ORAL
  Filled 2022-12-29 (×5): qty 1
  Filled 2022-12-29 (×3): qty 2

## 2022-12-29 MED ORDER — MIDODRINE HCL 5 MG PO TABS
30.0000 mg | ORAL_TABLET | Freq: Three times a day (TID) | ORAL | Status: DC
  Administered 2022-12-30 – 2022-12-31 (×4): 30 mg via ORAL
  Filled 2022-12-29 (×4): qty 6

## 2022-12-29 MED ORDER — ONDANSETRON HCL 4 MG/2ML IJ SOLN
INTRAMUSCULAR | Status: DC | PRN
  Administered 2022-12-29: 4 mg via INTRAVENOUS

## 2022-12-29 MED ORDER — DEXMEDETOMIDINE HCL IN NACL 400 MCG/100ML IV SOLN: 0.0000 ug/kg/h | INTRAVENOUS | Status: DC

## 2022-12-29 MED ORDER — AMIODARONE HCL IN DEXTROSE 360-4.14 MG/200ML-% IV SOLN
30.0000 mg/h | INTRAVENOUS | Status: DC
  Administered 2022-12-30 – 2022-12-31 (×5): 30 mg/h via INTRAVENOUS
  Filled 2022-12-29 (×4): qty 200

## 2022-12-29 MED ORDER — SODIUM CHLORIDE 0.9 % IV BOLUS
250.0000 mL | Freq: Once | INTRAVENOUS | Status: AC
  Administered 2022-12-29: 250 mL via INTRAVENOUS

## 2022-12-29 MED ORDER — POTASSIUM CHLORIDE 10 MEQ/100ML IV SOLN
10.0000 meq | INTRAVENOUS | Status: AC
  Administered 2022-12-30 (×4): 10 meq via INTRAVENOUS
  Filled 2022-12-29 (×4): qty 100

## 2022-12-29 MED ORDER — PHENYLEPHRINE 80 MCG/ML (10ML) SYRINGE FOR IV PUSH (FOR BLOOD PRESSURE SUPPORT)
PREFILLED_SYRINGE | INTRAVENOUS | Status: DC | PRN
  Administered 2022-12-29: 240 ug via INTRAVENOUS

## 2022-12-29 MED ORDER — DEXTROSE-SODIUM CHLORIDE 5-0.9 % IV SOLN: INTRAVENOUS | Status: DC

## 2022-12-29 MED ORDER — DEXTROSE 50 % IV SOLN
1.0000 | Freq: Once | INTRAVENOUS | Status: AC
  Administered 2022-12-29: 50 mL via INTRAVENOUS

## 2022-12-29 MED ORDER — SODIUM CHLORIDE 0.9 % IV SOLN
250.0000 mL | INTRAVENOUS | Status: DC
  Administered 2022-12-29 (×2): 250 mL via INTRAVENOUS

## 2022-12-29 MED ORDER — VANCOMYCIN HCL 1250 MG/250ML IV SOLN
1250.0000 mg | Freq: Once | INTRAVENOUS | Status: AC
  Administered 2022-12-29: 1250 mg via INTRAVENOUS
  Filled 2022-12-29: qty 250

## 2022-12-29 MED ORDER — ASPIRIN 81 MG PO CHEW
81.0000 mg | CHEWABLE_TABLET | Freq: Every day | ORAL | Status: DC
  Administered 2022-12-30 – 2022-12-31 (×2): 81 mg via ORAL
  Filled 2022-12-29 (×2): qty 1

## 2022-12-29 MED ORDER — AMIODARONE HCL IN DEXTROSE 360-4.14 MG/200ML-% IV SOLN
60.0000 mg/h | INTRAVENOUS | Status: AC
  Administered 2022-12-29 – 2022-12-30 (×2): 60 mg/h via INTRAVENOUS
  Filled 2022-12-29 (×2): qty 200

## 2022-12-29 MED ORDER — CLEVIDIPINE BUTYRATE 0.5 MG/ML IV EMUL: 0.0000 mg/h | INTRAVENOUS | Status: AC

## 2022-12-29 MED ORDER — AMIODARONE HCL IN DEXTROSE 360-4.14 MG/200ML-% IV SOLN
INTRAVENOUS | Status: DC | PRN
  Administered 2022-12-29: 30 mg/h via INTRAVENOUS

## 2022-12-29 MED ORDER — NOREPINEPHRINE 4 MG/250ML-% IV SOLN
2.0000 ug/min | INTRAVENOUS | Status: DC
  Administered 2022-12-29: 9 ug/min via INTRAVENOUS
  Administered 2022-12-29 – 2022-12-30 (×2): 4 ug/min via INTRAVENOUS
  Filled 2022-12-29 (×2): qty 250

## 2022-12-29 MED ORDER — MAGNESIUM SULFATE 2 GM/50ML IV SOLN
2.0000 g | Freq: Once | INTRAVENOUS | Status: AC
  Administered 2022-12-30: 2 g via INTRAVENOUS
  Filled 2022-12-29: qty 50

## 2022-12-29 MED ORDER — HEPARIN (PORCINE) 25000 UT/250ML-% IV SOLN
INTRAVENOUS | Status: DC | PRN
  Administered 2022-12-29: 900 [IU]/h via INTRAVENOUS

## 2022-12-29 MED ORDER — FENTANYL CITRATE (PF) 100 MCG/2ML IJ SOLN
25.0000 ug | INTRAMUSCULAR | Status: DC | PRN
  Administered 2022-12-29 (×2): 50 ug via INTRAVENOUS

## 2022-12-29 MED ORDER — SODIUM CHLORIDE 0.9 % IV SOLN: 250.0000 mL | INTRAVENOUS | Status: DC

## 2022-12-29 MED ORDER — ADULT MULTIVITAMIN W/MINERALS CH
1.0000 | ORAL_TABLET | Freq: Every day | ORAL | Status: DC
  Administered 2022-12-30 – 2023-01-02 (×4): 1 via ORAL
  Filled 2022-12-29 (×4): qty 1

## 2022-12-29 MED ORDER — ATORVASTATIN CALCIUM 40 MG PO TABS: 80.0000 mg | ORAL_TABLET | Freq: Every day | ORAL | Status: DC

## 2022-12-29 MED ORDER — MORPHINE SULFATE (PF) 2 MG/ML IV SOLN
1.0000 mg | INTRAVENOUS | Status: DC | PRN
  Administered 2022-12-29: 2 mg via INTRAVENOUS
  Filled 2022-12-29: qty 1

## 2022-12-29 MED ORDER — SODIUM CHLORIDE 0.9 % IV SOLN: INTRAVENOUS | Status: DC | PRN

## 2022-12-29 MED ORDER — MELATONIN 3 MG PO TABS
9.0000 mg | ORAL_TABLET | Freq: Every day | ORAL | Status: DC
  Administered 2022-12-30 – 2023-01-01 (×3): 9 mg via ORAL
  Filled 2022-12-29 (×3): qty 3

## 2022-12-29 MED ORDER — CANGRELOR BOLUS VIA INFUSION
INTRAVENOUS | Status: AC | PRN
  Administered 2022-12-29: 978 ug via INTRAVENOUS

## 2022-12-29 MED ORDER — STROKE: EARLY STAGES OF RECOVERY BOOK
Freq: Once | Status: AC
  Filled 2022-12-29: qty 1

## 2022-12-29 MED ORDER — VANCOMYCIN VARIABLE DOSE PER UNSTABLE RENAL FUNCTION (PHARMACIST DOSING): Status: DC

## 2022-12-29 MED ORDER — VERAPAMIL HCL 2.5 MG/ML IV SOLN
INTRAVENOUS | Status: AC | PRN
  Administered 2022-12-29: 2.5 mg via INTRA_ARTERIAL

## 2022-12-29 MED ORDER — PYRIDOSTIGMINE BROMIDE 60 MG PO TABS
60.0000 mg | ORAL_TABLET | Freq: Three times a day (TID) | ORAL | Status: DC
  Administered 2022-12-30 – 2023-01-16 (×46): 60 mg via ORAL
  Filled 2022-12-29 (×59): qty 1

## 2022-12-29 MED ORDER — ALBUTEROL SULFATE (2.5 MG/3ML) 0.083% IN NEBU
2.5000 mg | INHALATION_SOLUTION | RESPIRATORY_TRACT | Status: DC | PRN
  Administered 2022-12-31: 2.5 mg via RESPIRATORY_TRACT
  Filled 2022-12-29: qty 3

## 2022-12-29 MED ORDER — CANGRELOR TETRASODIUM 50 MG IV SOLR
INTRAVENOUS | Status: AC
  Filled 2022-12-29: qty 50

## 2022-12-29 MED ORDER — TICAGRELOR 90 MG PO TABS
90.0000 mg | ORAL_TABLET | Freq: Two times a day (BID) | ORAL | Status: DC
  Administered 2022-12-30 – 2023-01-12 (×20): 90 mg via ORAL
  Filled 2022-12-29 (×24): qty 1

## 2022-12-29 MED ORDER — ESMOLOL HCL 100 MG/10ML IV SOLN
INTRAVENOUS | Status: DC | PRN
  Administered 2022-12-29: 30 mg via INTRAVENOUS

## 2022-12-29 MED ORDER — DOCUSATE SODIUM 100 MG PO CAPS: 100.0000 mg | ORAL_CAPSULE | Freq: Two times a day (BID) | ORAL | Status: DC | PRN

## 2022-12-29 MED ORDER — ACETAMINOPHEN 650 MG RE SUPP: 650.0000 mg | RECTAL | Status: DC | PRN

## 2022-12-29 MED ORDER — ACETAMINOPHEN 160 MG/5ML PO SOLN: 650.0000 mg | ORAL | Status: DC | PRN

## 2022-12-29 MED ORDER — TICAGRELOR 60 MG PO TABS
ORAL_TABLET | ORAL | Status: AC | PRN
  Administered 2022-12-29: 90 mg

## 2022-12-29 MED ORDER — ETOMIDATE 2 MG/ML IV SOLN
INTRAVENOUS | Status: DC | PRN
  Administered 2022-12-29: 12 mg via INTRAVENOUS

## 2022-12-29 MED ORDER — IOHEXOL 300 MG/ML  SOLN
150.0000 mL | Freq: Once | INTRAMUSCULAR | Status: AC | PRN
  Administered 2022-12-29: 100 mL via INTRA_ARTERIAL

## 2022-12-29 MED ORDER — IOHEXOL 300 MG/ML  SOLN
50.0000 mL | Freq: Once | INTRAMUSCULAR | Status: AC | PRN
  Administered 2022-12-29: 25 mL via INTRA_ARTERIAL

## 2022-12-29 MED ORDER — TICAGRELOR 90 MG PO TABS
90.0000 mg | ORAL_TABLET | Freq: Two times a day (BID) | ORAL | Status: DC
  Administered 2023-01-04 – 2023-01-10 (×8): 90 mg
  Filled 2022-12-29 (×21): qty 1

## 2022-12-29 MED ORDER — NOREPINEPHRINE 4 MG/250ML-% IV SOLN
2.0000 ug/min | INTRAVENOUS | Status: DC
  Administered 2022-12-29: 2 ug/min via INTRAVENOUS
  Filled 2022-12-29: qty 250

## 2022-12-29 MED ORDER — HEPARIN (PORCINE) 25000 UT/250ML-% IV SOLN
900.0000 [IU]/h | INTRAVENOUS | Status: DC
  Administered 2022-12-29: 900 [IU]/h via INTRAVENOUS
  Filled 2022-12-29: qty 250

## 2022-12-29 MED ORDER — IOHEXOL 350 MG/ML SOLN
60.0000 mL | Freq: Once | INTRAVENOUS | Status: AC | PRN
  Administered 2022-12-29: 60 mL via INTRAVENOUS

## 2022-12-29 MED ORDER — FENTANYL CITRATE (PF) 100 MCG/2ML IJ SOLN: 25.0000 ug | INTRAMUSCULAR | Status: DC | PRN

## 2022-12-29 MED ORDER — GABAPENTIN 100 MG PO CAPS
100.0000 mg | ORAL_CAPSULE | Freq: Every day | ORAL | Status: DC
  Administered 2022-12-30 – 2023-01-15 (×16): 100 mg via ORAL
  Filled 2022-12-29 (×16): qty 1

## 2022-12-29 MED ORDER — ASPIRIN 81 MG PO CHEW
CHEWABLE_TABLET | ORAL | Status: AC | PRN
  Administered 2022-12-29: 81 mg

## 2022-12-29 MED ORDER — NITROGLYCERIN 1 MG/10 ML FOR IR/CATH LAB
INTRA_ARTERIAL | Status: AC | PRN
  Administered 2022-12-29: 25 ug via INTRA_ARTERIAL

## 2022-12-29 MED ORDER — VANCOMYCIN HCL 750 MG/150ML IV SOLN
750.0000 mg | Freq: Once | INTRAVENOUS | Status: DC
  Filled 2022-12-29: qty 150

## 2022-12-29 MED ORDER — PANTOPRAZOLE SODIUM 40 MG PO TBEC
40.0000 mg | DELAYED_RELEASE_TABLET | Freq: Two times a day (BID) | ORAL | Status: DC
  Administered 2022-12-30 – 2023-01-16 (×33): 40 mg via ORAL
  Filled 2022-12-29 (×32): qty 1

## 2022-12-29 NOTE — Progress Notes (Signed)
Pt transported to and from CT via bed accompanied by Primary RN, Charge nurse and Nursing supervisor. Pt did receive ativan 2 mg IV for for agitation, combativeness and inability to lie still, ordered by Dr. Amada Jupiter at [phone] . VS remained stable during procedure.

## 2022-12-29 NOTE — Progress Notes (Signed)
SUBJECTIVE: Patient was confused   Vitals:   12/29/22 1145 12/29/22 1200 12/29/22 1211 12/29/22 1215  BP: 109/64 113/68 113/68 (!) 108/57  Pulse: 68 (!) 135 (!) 139 (!) 132  Resp: 12 20 14 19   Temp:   98.6 F (37 C)   TempSrc:   Axillary   SpO2: 100% 100% 100% 100%  Weight:      Height:        Intake/Output Summary (Last 24 hours) at 12/29/2022 1228 Last data filed at 12/29/2022 1227 Gross per 24 hour  Intake 4267.12 ml  Output 1050 ml  Net 3217.12 ml    LABS: Basic Metabolic Panel: Recent Labs    12/27/22 1216 12/28/22 0409 12/29/22 0836  NA 138 134* 132*  K 4.2 4.1 3.7  CL 105 103 102  CO2 25 24 21*  GLUCOSE 108* 102* 97  BUN 19 29* 39*  CREATININE 1.54* 1.76* 2.12*  CALCIUM 9.4 8.4* 7.7*  MG 1.9  --   --    Liver Function Tests: Recent Labs    12/28/22 0409 12/29/22 0836  AST 22 28  ALT 21 20  ALKPHOS 66 53  BILITOT 0.9 0.9  PROT 5.7* 5.1*  ALBUMIN 3.3* 2.8*   No results for input(s): "LIPASE", "AMYLASE" in the last 72 hours. CBC: Recent Labs    12/28/22 0409 12/29/22 0836  WBC 20.6* 12.9*  HGB 8.7* 8.4*  HCT 26.8* 26.2*  MCV 91.8 92.6  PLT 141* 109*   Cardiac Enzymes: No results for input(s): "CKTOTAL", "CKMB", "CKMBINDEX", "TROPONINI" in the last 72 hours. BNP: Invalid input(s): "POCBNP" D-Dimer: No results for input(s): "DDIMER" in the last 72 hours. Hemoglobin A1C: No results for input(s): "HGBA1C" in the last 72 hours. Fasting Lipid Panel: No results for input(s): "CHOL", "HDL", "LDLCALC", "TRIG", "CHOLHDL", "LDLDIRECT" in the last 72 hours. Thyroid Function Tests: No results for input(s): "TSH", "T4TOTAL", "T3FREE", "THYROIDAB" in the last 72 hours.  Invalid input(s): "FREET3" Anemia Panel: No results for input(s): "VITAMINB12", "FOLATE", "FERRITIN", "TIBC", "IRON", "RETICCTPCT" in the last 72 hours.   PHYSICAL EXAM General: Well developed, well nourished, in no acute distress HEENT:  Normocephalic and atramatic Neck:  No  JVD.  Lungs: Clear bilaterally to auscultation and percussion. Heart: HRRR . Normal S1 and S2 without gallops or murmurs.  Abdomen: Bowel sounds are positive, abdomen soft and non-tender  Msk:  Back normal, normal gait. Normal strength and tone for age. Extremities: No clubbing, cyanosis or edema.   Neuro: Alert and oriented X 3. Psych:  Good affect, responds appropriately  TELEMETRY: Atrial fibrillation with ventricular rate 110.  ASSESSMENT AND PLAN: Atrial fibrillation with ventricular rate 110.  Amiodarone drip was discontinued due to hypotension.  Patient developed right-sided weakness and CTA revealed left internal carotid artery thrombus.  Patient is being transferred to [facility] for interventional radiology intervention.  Patient also has blood culture positive for Staph aureus and may have septic shock..  Will follow with you closely.  Because of hypotension Lasix and amiodarone has been held but digoxin 0.125 IV can be given for ventricular rate greater than 120.   ICD-10-CM   1. Atrial fibrillation with rapid ventricular response ([name])  I48.91       Principal Problem:   Atrial fibrillation with RVR ([name]) Active Problems:   Acute on chronic respiratory failure with hypoxia ([name])   Acute on chronic combined systolic and diastolic CHF (congestive heart failure) ([name])   CAD (coronary artery disease)   CKD (chronic kidney disease)  stage 3, GFR 30-59 ml/min ([name])   Elevated troponin   COPD (chronic obstructive pulmonary disease) ([name])   Known medical problems   Sepsis ([name])   Occlusive thrombus    Adrian Blackwater, MD, [facility] 12/29/2022 12:28 PM

## 2022-12-29 NOTE — Anesthesia Procedure Notes (Signed)
Procedure Name: Intubation Date/Time: 12/29/2022 1:04 PM  Performed by: Dairl Ponder, CRNAPre-anesthesia Checklist: Patient identified, Emergency Drugs available, Suction available and Patient being monitored Patient Re-evaluated:Patient Re-evaluated prior to induction Oxygen Delivery Method: Circle System Utilized Preoxygenation: Pre-oxygenation with 100% oxygen Induction Type: IV induction, Cricoid Pressure applied and Rapid sequence Laryngoscope Size: McGraph and 4 Grade View: Grade I Tube type: Oral Tube size: 7.5 mm Number of attempts: 1 Airway Equipment and Method: Stylet and Oral airway Placement Confirmation: ETT inserted through vocal cords under direct vision, positive ETCO2 and breath sounds checked- equal and bilateral Secured at: 22 cm Tube secured with: Tape Dental Injury: Teeth and Oropharynx as per pre-operative assessment

## 2022-12-29 NOTE — Progress Notes (Signed)
An USGPIV (ultrasound guided PIV) has been placed for short-term vasopressor infusion. A correctly placed ivWatch must be used when administering Vasopressors. Should this treatment be needed beyond 72 hours, central line access should be obtained.  It will be the responsibility of the bedside nurse to follow best practice to prevent extravasations.  HS Delainee Tramel RN 

## 2022-12-29 NOTE — Anesthesia Preprocedure Evaluation (Addendum)
Anesthesia Evaluation  Patient identified by MRN, date of birth, ID band Patient confused    Reviewed: Allergy & Precautions, H&P , NPO status , Patient's Chart, lab work & pertinent test results  Airway Mallampati: III  TM Distance: >3 FB Neck ROM: Full    Dental no notable dental hx. (+) Teeth Intact, Dental Advisory Given   Pulmonary COPD,  COPD inhaler, former smoker   Pulmonary exam normal breath sounds clear to auscultation       Cardiovascular hypertension, Pt. on medications and Pt. on home beta blockers + CAD and +CHF  + dysrhythmias Atrial Fibrillation  Rhythm:Irregular Rate:Tachycardia     Neuro/Psych    Depression    CVA, Residual Symptoms    GI/Hepatic Neg liver ROS, PUD,GERD  Medicated,,  Endo/Other  negative endocrine ROS    Renal/GU Renal InsufficiencyRenal disease  negative genitourinary   Musculoskeletal   Abdominal   Peds  Hematology  (+) Blood dyscrasia, anemia   Anesthesia Other Findings   Reproductive/Obstetrics negative OB ROS                             Anesthesia Physical Anesthesia Plan  ASA: 3 and emergent  Anesthesia Plan: General   Post-op Pain Management:    Induction: Intravenous, Rapid sequence and Cricoid pressure planned  PONV Risk Score and Plan: 2 and Ondansetron and Dexamethasone  Airway Management Planned: Oral ETT  Additional Equipment: Arterial line  Intra-op Plan:   Post-operative Plan: Possible Post-op intubation/ventilation  Informed Consent: I have reviewed the patients History and Physical, chart, labs and discussed the procedure including the risks, benefits and alternatives for the proposed anesthesia with the patient or authorized representative who has indicated his/her understanding and acceptance.     Dental advisory given  Plan Discussed with: CRNA  Anesthesia Plan Comments:        Anesthesia Quick Evaluation

## 2022-12-29 NOTE — Progress Notes (Signed)
Pharmacy Antibiotic Note  Matthew Zamora is a 82 y.o. male admitted on 12/29/2022 with bacteremia.  Pharmacy has been consulted for vancomycin dosing.  Patient presented with new onset SOB. He developed new fevers and was found to have MRSA in his blood cultures. He was initially started on vancomycin and cefepime at [facility]. Pharmacy has been consulted to re-initiate vancomycin for MRSA bacteremia.   Vancomycin random level was drawn ~ 30 hours after last dose was given and resulted at 10. Last dose given at [facility] was 1500 mg IV x 1 dose. Of note, patient's renal function has declined and Scr has increased from 1.54 to 2.12.  Plan: Administer vancomycin IV 1250 mg x 1  Initiate variable vancomycin dosing per pharmacy. Obtain random vancomycin level on 7/15 about 24 hours after 7/14 dose.  Continue to monitor renal function and clinical status Follow-up duration of treatment and ID recommendations  Weight: 67.2 kg (148 lb 2.4 oz)  Temp (24hrs), Avg:98.1 F (36.7 C), Min:97.2 F (36.2 C), Max:98.7 F (37.1 C)  Recent Labs  Lab 12/27/22 1216 12/28/22 0409 12/28/22 1340 12/29/22 0836 12/29/22 1824  WBC 10.9* 20.6*  --  12.9*  --   CREATININE 1.54* 1.76*  --  2.12*  --   LATICACIDVEN  --   --  1.6  --   --   VANCORANDOM  --   --   --   --  10    Estimated Creatinine Clearance: 26.4 mL/min (A) (by C-G formula based on SCr of 2.12 mg/dL (H)).    Allergies  Allergen Reactions   Nitrofuran Derivatives Other (See Comments)    Transaminitis ** confounded w/Amiodarone, Mexiletine   Spironolactone     Significant transaminitis     Antimicrobials this admission: Vancomycin 7/13, 7/14 Cefepime 7/13 x 1 dose  Microbiology results: 7/13 BCx: MRSA 7/14 MRSA PCR: pending  Thank you for allowing pharmacy to be a part of this patient's care.  Lennie Muckle, PharmD PGY1 Pharmacy Resident 12/29/2022 7:26 PM

## 2022-12-29 NOTE — H&P (Addendum)
NAME:  Matthew Zamora, MRN:  [id number], DOB:  Jul 04, 1940, LOS: 2 ADMISSION DATE:  12/27/2022, CONSULTATION DATE: 7/14 REFERRING MD:  Clide Dales, CHIEF COMPLAINT:   MRSA bacteremia with septic shock And acute left internal carotid artery thrombus   History of Present Illness:  82 year old male patient who presented to the emergency room at a [facility] with acute onset of shortness of breath.  Denied any chest pain, no change in medications.  After diagnostic evaluation in the ER he was initially admitted with working diagnosis of acute on chronic heart failure with associated respiratory failure complicated further by atrial fibrillation with RVR .  He was treated with IV diuresis as well as calcium channel blockade course further complicated later following admission by hypotension requiring fluid resuscitation on 7/13 he continued to have atrial fibrillation with RVR he was started on amiodarone infusion and cardiology was consulted.  He also spiked fever at which time cultures and respiratory viral panel were sent and he was empirically started on cefepime and vancomycin.  He was moved to the intensive care.  Amiodarone later placed on hold due to hypotension with resuscitation focus changed to volume resuscitation.  BC ID panel was positive for Staph aureus/MRSA from blood cultures.  Respiratory viral panel positive for rhinovirus.  On 7/14 a code stroke was called around 8:30 in the morning when he developed sudden onset of right-sided weakness and aphasia as well as some agitation.  A stat CT angiogram was obtained this showed nearly occlusive ICA thrombus on the left.  Neurology and vascular surgery were consulted he was started on norepinephrine infusion to maintain mean arterial blood pressure and transferred to [facility] for neuro interventional radiology intervention critical care asked to assume primary care  Significant Hospital Events: Including procedures, antibiotic start and stop dates in  addition to other pertinent events   7/12 admitted [facility] with shortness of breath and initially a working diagnosis of acute decompensated heart failure 7/13 new fevers.  Blood cultures positive for MRSA antibiotic started, Vanco and cefepime hypotensive.  Having trouble with atrial fibrillation with RVR initially started on amiodarone which was subsequently discontinued 7/14 acute neurological decompensation.  CT angiogram showed partially occlusive thrombus in the left ICA.  Transferred to [facility] for neuro interventional radiology consultation and treatment.  Echocardiogram shows global hypokinesis worse in the septum LVEF down to 30 to 35% left ventricular hypertrophy diastolic parameters indeterminate RV size normal no mention of vegetation. Transferred to [facility] where he underwent  complete revascularization of the  proximal left internal carotid artery with 1 pass with a 6 mm x 47 embotrap  retrieval device with contact aspiration, and subsequent stent assisted angioplasty placed on Cangrelor for 4 hours. Transferred to ICU post op   Interim History / Subjective:  Restless and encephalopathic   Objective   There were no vitals taken for this visit.        Intake/Output Summary (Last 24 hours) at 12/29/2022 1227 Last data filed at 12/29/2022 1227 Gross per 24 hour  Intake 4267.12 ml  Output 1050 ml  Net 3217.12 ml   There were no vitals filed for this visit.  Examination: General: 82 year old male laying in bed. Restless. Rolling sided to side. Nursing physically holding the right leg straight  HENT: PERRL MMM + JVD Lungs: clear some occ rhonchi  Cardiovascular: tachy irreg irreg af w/ RVR on tele  Abdomen: soft not tender  Extremities: warm trace LE edema brisk CR right dressing in groin  CD&I Neuro: awake. 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   Raising concerns for thromboembolic event.   Now s/p revascularization w/ clot retrieval f/b stent assisted angioplasty Differential diagnosis could be from recent atrial fibrillation versus endocarditis, TTE negative for vegetation but TEE has not been completed. Plan Serial neurochecks Blood pressure goal as directed by interventional radiology 120 -160  Antibiotics as above Timing on when to resume DOAC per stroke team  NPO until passes swallow F/u CT this evening per NIR   MRSA bacteremia with septic shock.  Origin of sepsis unclear.  This is superimposed on chronic hypotension for which she is typically on midodrine & mestinon for this  Plan Has been seen by infectious disease, now day #2 vancomycin and cefepime SBP goal 120-160 (currently not on pressors)  Keep euvolemic Will likely need TEE Continue telemetry monitoring Hold antihypertensives Cont midodrine and mestinon as able    Acute combined systolic and diastolic heart failure.,  Complicated by elevated troponins currently in septic shock Plan Holding diuresis Pulse ox    Atrial fibrillation with RVR. Plan Amiodarone gtt  Holding antiplatelet and AC x 24 hrs after neuro IR (per stoke team note)      Acute metabolic encephalopathy secondary to sepsis Plan Supportive care Treat fevers Keep euglycemic Adding low dose precedex  Maintain goal mean arterial pressure as above   Acute on chronic renal failure, baseline CKD stage III Plan Ensure mean arterial pressure greater than 65 Renal dose medications Strict intake output Serial labs   Anemia without evidence of bleeding Plan Serial CBCs Transfusion trigger for hemoglobin less than 7   Mild thrombocytopenia in the setting of sepsis Plan Supportive care Follow-up CBC Treat sepsis    Best Practice (right click and "Reselect all SmartList Selections" daily)   Diet/type: NPO DVT prophylaxis: SCD GI prophylaxis: N/A Lines: N/A Foley:  N/A Code  Status:  full code Last date of multidisciplinary goals of care discussion [pending]  Labs   CBC: Recent Labs  Lab 12/27/22 1216 12/28/22 0409 12/29/22 0836  WBC 10.9* 20.6* 12.9*  HGB 9.7* 8.7* 8.4*  HCT 31.5* 26.8* 26.2*  MCV 94.9 91.8 92.6  PLT 178 141* 109*    Basic Metabolic Panel: Recent Labs  Lab 12/27/22 1216 12/28/22 0409 12/29/22 0836  NA 138 134* 132*  K 4.2 4.1 3.7  CL 105 103 102  CO2 25 24 21*  GLUCOSE 108* 102* 97  BUN 19 29* 39*  CREATININE 1.54* 1.76* 2.12*  CALCIUM 9.4 8.4* 7.7*  MG 1.9  --   --    GFR: CrCl cannot be calculated (Unknown ideal weight.). Recent Labs  Lab 12/27/22 1216 12/28/22 0409 12/28/22 1340 12/29/22 0836  PROCALCITON  --   --   --  5.58  WBC 10.9* 20.6*  --  12.9*  LATICACIDVEN  --   --  1.6  --     Liver Function Tests: Recent Labs  Lab 12/27/22 1216 12/28/22 0409 12/29/22 0836  AST 25 22 28   ALT 25 21 20   ALKPHOS 81 66 53  BILITOT 0.6 0.9 0.9  PROT 6.2* 5.7* 5.1*  ALBUMIN 3.5 3.3* 2.8*   No results for input(s): "LIPASE", "AMYLASE" in the last 168 hours. No results for input(s): "AMMONIA" in the last 168 hours.  ABG    Component Value Date/Time  PHART 7.36 10/28/2019 1823   PCO2ART 40 10/28/2019 1823   PO2ART 44 (L) 10/28/2019 1823   HCO3 29.9 (H) 11/10/2022 1737   ACIDBASEDEF 2.7 (H) 10/28/2019 1823   O2SAT 61.4 11/10/2022 1737     Coagulation Profile: No results for input(s): "INR", "PROTIME" in the last 168 hours.  Cardiac Enzymes: No results for input(s): "CKTOTAL", "CKMB", "CKMBINDEX", "TROPONINI" in the last 168 hours.  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 pylori infection, History of hepatitis, Hyperlipidemia, Hypertension, Liver cyst (05/16/15), PAF (paroxysmal atrial fibrillation) ([name]), and Prostate CA ([name]).   Surgical History:   Past Surgical History:  Procedure Laterality Date   CATARACT EXTRACTION     COLONOSCOPY  10/06/2014, 09/18/2004, 03/14/2009   ESOPHAGOGASTRODUODENOSCOPY  10/06/2014, 03/18/2001, 03/14/2009   ESOPHAGOGASTRODUODENOSCOPY (EGD) WITH PROPOFOL N/A 05/07/2018   Procedure: ESOPHAGOGASTRODUODENOSCOPY (EGD) WITH PROPOFOL;  Surgeon: Toledo, Boykin Nearing, MD;  Location: [facility] ENDOSCOPY;  Service: Gastroenterology;  Laterality: N/A;   ESOPHAGOGASTRODUODENOSCOPY (EGD) WITH PROPOFOL N/A 04/22/2022   Procedure: ESOPHAGOGASTRODUODENOSCOPY (EGD) WITH PROPOFOL;  Surgeon: Toney Reil, MD;  Location: [facility] ENDOSCOPY;  Service: Endoscopy;  Laterality: N/A;   FLEXIBLE SIGMOIDOSCOPY  08/26/1990   HIP ARTHROPLASTY Right 10/31/2022   Procedure: ARTHROPLASTY BIPOLAR HIP (HEMIARTHROPLASTY)-RNFA;  Surgeon: Reinaldo Berber, MD;  Location: [facility] ORS;  Service: Orthopedics;  Laterality: Right;   INSERTION OF ICD     PROSTATE SURGERY     TONSILLECTOMY       Social History:   reports that he quit smoking about 23 years ago. His smoking use included cigarettes. He started smoking about 61 years ago. He has a 38 pack-year smoking history. He has never used smokeless tobacco. He reports that he does not currently use drugs. He reports that he does not drink alcohol.   Family History:  His family history includes Heart attack in his father and mother.    Allergies Allergies  Allergen Reactions   Nitrofuran Derivatives Other (See Comments)    Transaminitis ** confounded w/Amiodarone, Mexiletine   Spironolactone     Significant transaminitis      Home Medications  Prior to Admission medications   Medication Sig Start Date End Date Taking? Authorizing Provider  acetaminophen (TYLENOL) 325 MG tablet Take 2 tablets (650 mg total) by mouth every 4 (four) hours as needed for headache or mild pain. 10/29/19   Judithe Modest, NP  albuterol (PROVENTIL) (2.5 MG/3ML) 0.083% nebulizer solution Inhale 3 mLs (2.5 mg total) into the lungs as needed for shortness of breath. 10/29/19   Judithe Modest, NP  ascorbic acid (VITAMIN C) 500 MG tablet Take 500 mg by mouth daily.    [provider]  atorvastatin (LIPITOR) 80 MG tablet Take 80 mg by mouth daily. 05/13/21   [provider]  calcium carbonate (TUMS EX) 750 MG chewable tablet Chew 1 tablet by mouth daily.    [provider]  ELIQUIS 2.5 MG TABS tablet Take 2.5 mg by mouth 2 (two) times daily. 12/28/21   [provider]  fluticasone-salmeterol (ADVAIR) 500-50 MCG/ACT AEPB Inhale 1 puff into the lungs in the morning and at bedtime. 04/05/22   [provider]  gabapentin (NEURONTIN) 100 MG capsule Take 100 mg by mouth at bedtime. 12/09/19   [provider]  guaiFENesin-dextromethorphan (ROBITUSSIN DM) 100-10 MG/5ML syrup Take 5 mLs by mouth every 4 (four) hours as needed for cough. 11/18/22   Enedina Finner, MD  magnesium oxide (MAG-OX) 400 (240 Mg) MG tablet Take 1 tablet by mouth daily. 02/05/21   [provider]  melatonin 3 MG TABS tablet Take 9 mg by mouth at bedtime.    [provider]  metoCLOPramide (REGLAN) 5 MG tablet Take 1 tablet (5 mg total) by mouth every 8 (eight) hours as needed for refractory nausea / vomiting. 05/13/22   Delfino Lovett, MD  metoprolol tartrate 37.5 MG TABS Take 1 tablet (37.5 mg total) by mouth 2 (two)  times daily. 12/20/22 01/19/23  Lucile Shutters, MD  midodrine (PROAMATINE) 5 MG tablet Take 1 tablet (5 mg total) by mouth 3 (three) times daily with meals. Patient taking differently: Take 30 mg by mouth 3 (three) times daily with meals. 11/18/22   Enedina Finner, MD  Multiple Vitamin (MULTIVITAMIN WITH MINERALS) TABS tablet Take 1 tablet by mouth daily. 10/29/19   Judithe Modest, NP  Nebulizer MISC 1 each by Does not apply route as needed. 06/16/21   Rolly Salter, MD  Nutritional Supplements (,FEEDING SUPPLEMENT, PROSOURCE PLUS) liquid Take 30 mLs by mouth 3 (three) times daily between meals. 05/13/22   Delfino Lovett, MD  ondansetron (ZOFRAN-ODT) 4 MG disintegrating tablet Take 4 mg by mouth every 8 (eight) hours as needed for refractory nausea / vomiting, vomiting or nausea.    [provider]  pantoprazole (PROTONIX) 40 MG tablet Take 1 tablet (40 mg total) by mouth 2 (two) times daily. 05/13/22   Delfino Lovett, MD  polyethylene glycol (MIRALAX / GLYCOLAX) 17 g packet Take 17 g by mouth 2 (two) times daily. 11/07/22   Leeroy Bock, MD  pyridostigmine (MESTINON) 60 MG tablet Take 1 tablet (60 mg total) by mouth every 8 (eight) hours. 05/13/22   Delfino Lovett, MD  sertraline (ZOLOFT) 25 MG tablet Take 25 mg by mouth daily.    [provider]  sucralfate (CARAFATE) 1 GM/10ML suspension Take 10 mLs (1 g total) by mouth 4 (four) times daily -  with meals and at bedtime. 05/13/22   Delfino Lovett, MD  tamsulosin (FLOMAX) 0.4 MG CAPS capsule Take 0.4 mg by mouth daily. 12/09/19   [provider]     Critical care time: 45 minutes    Simonne Martinet ACNP-BC [facility] Pulmonary/Critical Care Pager # [phone] OR # [phone] if no answer

## 2022-12-29 NOTE — Progress Notes (Signed)
Case discussed with Dr. Clide Dales; accepted to [facility] for vascular surgery eval of ICA thrombus and ongoing septic shock care.  Myrla Halsted MD PCCM

## 2022-12-29 NOTE — Transfer of Care (Signed)
Immediate Anesthesia Transfer of Care Note  Patient: Matthew Zamora  Procedure(s) Performed: IR WITH ANESTHESIA  Patient Location: PACU  Anesthesia Type:General  Level of Consciousness: awake  Airway & Oxygen Therapy: Patient Spontanous Breathing and Patient connected to face mask oxygen  Post-op Assessment: Report given to RN and Post -op Vital signs reviewed and stable  Post vital signs: Reviewed and stable  Last Vitals:  Vitals Value Taken Time  BP 135/100 12/29/22 1524  Temp    Pulse 132 12/29/22 1528  Resp 33 12/29/22 1528  SpO2 99 % 12/29/22 1528  Vitals shown include unfiled device data.  Last Pain: There were no vitals filed for this visit.       Complications: No notable events documented.

## 2022-12-29 NOTE — Anesthesia Postprocedure Evaluation (Signed)
Anesthesia Post Note  Patient: Matthew Zamora  Procedure(s) Performed: IR WITH ANESTHESIA     Patient location during evaluation: PACU Anesthesia Type: General Level of consciousness: confused Pain management: pain level controlled Vital Signs Assessment: post-procedure vital signs reviewed and stable Respiratory status: spontaneous breathing, nonlabored ventilation, respiratory function stable and patient connected to nasal cannula oxygen Cardiovascular status: blood pressure returned to baseline, stable and tachycardic Postop Assessment: no apparent nausea or vomiting Anesthetic complications: no  No notable events documented.  Last Vitals:  Vitals:   12/29/22 1545 12/29/22 1600  BP: (!) 141/78 125/83  Pulse: (!) 132 (!) 132  Resp: 15 13  Temp:    SpO2: 92% 96%    Last Pain:  Vitals:   12/29/22 1545  PainSc: 6                  Rayne Cowdrey,W. EDMOND

## 2022-12-29 NOTE — Anesthesia Procedure Notes (Signed)
Arterial Line Insertion Start/End7/14/2024 1:38 PM Performed by: Aundria Rud, CRNA  Left, radial was placed Catheter size: 20 G  Attempts: 1 Procedure performed without using ultrasound guided technique.

## 2022-12-29 NOTE — Consult Note (Addendum)
Reason for consult: hypotension   82 year old male admitted on 7/12 with shortness of breath due to what looks to be acutely decompensated heart failure due to the profoundly high Pro BNP. Since then, his WBC has come back high at 20.6 and he spiked a fever to 103 this morning. CT scan shows no current evidence of pneumonia.   Overnight, became persistently hypotensive even after fluid bolus.   Blood cultures positive for likely MRSA.  Positive rhinovirus which normally necessitates supportive care.  - starting levophed to maintain MAP >65 - echo ordered for valve - follow up cultures - tachycardia likely due to fluid low status and possible underlying sepsis? - agree with empiric antibiotics - follow up cultures - follow up procalcitonin - would consider judicious fluid resuscitation - agree with midodrine - recommend ID consultation for possible valve endocarditis if clinical picture changes  - we will follow along with you closely  Rhea Bleacher MD Pulmonary & Critical Care Medicine

## 2022-12-29 NOTE — Progress Notes (Signed)
  Echocardiogram 2D Echocardiogram has been performed.  Matthew Zamora 12/29/2022, 8:17 AM

## 2022-12-29 NOTE — Code Documentation (Signed)
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 pylori infection    History of hepatitis    Hyperlipidemia    Hypertension    Liver cyst 05/16/15   PAF (paroxysmal atrial fibrillation) ([name])    Prostate CA ([name])    Prior to Admission medications   Medication Sig Start Date End Date Taking? Authorizing Provider  acetaminophen (TYLENOL) 325 MG tablet Take 2 tablets (650 mg total) by mouth every 4 (four) hours as needed for headache or mild pain. 10/29/19  Yes Harlon Ditty D, NP  albuterol (PROVENTIL) (2.5 MG/3ML) 0.083% nebulizer solution Inhale 3 mLs (2.5 mg total) into the lungs as needed for shortness of breath. 10/29/19  Yes Harlon Ditty D, NP  ascorbic acid (VITAMIN C) 500 MG tablet Take 500 mg by mouth daily.   Yes [provider]  atorvastatin (LIPITOR) 80 MG tablet Take 80 mg by mouth daily. 05/13/21  Yes [provider]  calcium carbonate (TUMS EX) 750 MG chewable tablet Chew 1 tablet by mouth daily.   Yes [provider]  ELIQUIS 2.5 MG TABS tablet Take 2.5 mg by mouth 2 (two) times daily. 12/28/21  Yes [provider]  fluticasone-salmeterol (ADVAIR) 500-50 MCG/ACT AEPB Inhale 1 puff into the lungs in the morning and at bedtime. 04/05/22  Yes [provider]  gabapentin (NEURONTIN) 100 MG capsule Take 100 mg by mouth at bedtime. 12/09/19  Yes [provider]  guaiFENesin-dextromethorphan (ROBITUSSIN DM) 100-10 MG/5ML syrup Take 5 mLs by mouth every 4 (four) hours as needed for cough. 11/18/22  Yes Enedina Finner, MD  magnesium oxide (MAG-OX) 400 (240 Mg) MG tablet Take 1 tablet by mouth daily. 02/05/21  Yes [provider]  melatonin 3 MG TABS tablet Take 9 mg by mouth at bedtime.   Yes [provider]  metoCLOPramide (REGLAN) 5 MG tablet Take 1 tablet (5 mg total) by mouth every 8 (eight) hours as needed for refractory nausea / vomiting. 05/13/22  Yes Delfino Lovett, MD  metoprolol tartrate 37.5 MG TABS Take 1 tablet (37.5 mg total) by mouth 2 (two) times daily. 12/20/22 01/19/23 Yes Agbata, Tochukwu, MD  midodrine (PROAMATINE) 5 MG tablet Take 1 tablet (5 mg total) by mouth 3 (three) times daily with meals. Patient taking differently: Take 30 mg by mouth 3 (three) times daily with meals. 11/18/22  Yes Enedina Finner, MD  Multiple Vitamin (MULTIVITAMIN WITH MINERALS) TABS tablet Take 1 tablet by mouth daily. 10/29/19  Yes Harlon Ditty D, NP  ondansetron (ZOFRAN-ODT) 4 MG disintegrating tablet Take 4 mg by mouth every 8 (eight) hours as needed for refractory nausea / vomiting, vomiting or nausea.   Yes [provider]  pantoprazole (PROTONIX) 40 MG tablet Take 1 tablet (40 mg total) by mouth 2 (two) times daily. 05/13/22  Yes Delfino Lovett, MD  polyethylene glycol (MIRALAX / GLYCOLAX) 17 g packet Take 17  g by mouth 2 (two) times daily. 11/07/22  Yes Leeroy Bock, MD  pyridostigmine (MESTINON) 60 MG tablet Take 1 tablet (60 mg total) by mouth every 8 (eight) hours. 05/13/22  Yes Delfino Lovett, MD  sertraline (ZOLOFT) 25 MG tablet Take 25 mg by mouth daily.   Yes [provider]  sucralfate (CARAFATE) 1 GM/10ML suspension Take 10 mLs (1 g total) by mouth 4 (four) times daily -  with meals and at bedtime. 05/13/22  Yes Delfino Lovett, MD  tamsulosin (FLOMAX) 0.4 MG CAPS capsule Take 0.4 mg by mouth daily. 12/09/19  Yes [provider]  Nebulizer MISC 1 each by Does not apply route as needed. 06/16/21   Rolly Salter, MD  Nutritional Supplements (,FEEDING SUPPLEMENT, PROSOURCE PLUS) liquid Take 30 mLs by mouth 3 (three) times daily between meals. 05/13/22   Delfino Lovett, MD    Lowella Bandy ,PharmD Clinical Pharmacist  12/29/2022  8:58 AM

## 2022-12-29 NOTE — Progress Notes (Addendum)
eLink Physician-Brief Progress Note Patient Name: Matthew Zamora DOB: 02/03/1941 MRN: [id number]   Date of Service  12/29/2022  HPI/Events of Note  60+ beat of v tach. Self converted back to ST. BP now 111/50 (50) , Running Amio gtt without an order.  Camera: Discussed with RN, neuro walked in also. HR 120's now, a fib, on amiodarone, levo at 10 via US guided PIV. MAP > 65. In synchrony with vent, 100% sats.  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 Came with amiodarone drip from IR. Need order.  MRSA sepsis HFrEF 30%.   Code stroke on 14 th, admit date is on 35 th for COPD exacerbation, afib RVR.       eICU Interventions  Get EKG, troponin Amiodarone drip ordered Get BMP, ABG, mag, po4, ion calcium, if low call back      Intervention Category Intermediate Interventions: Arrhythmia - evaluation and management  Ranee Gosselin 12/29/2022, 9:47 PM  22:32 Had another NSVT, lesser duration than previous. EKG: Tachy at 132, base line artifact.  No acte STEMI.  Discussed with RN. Temp 99 CBG -96.   A fib hx on eliquis , not on it now due to CVA. Last eliquis documented  taking is on 12/16/2022. - amiodarone 150 bolus - Dextrose 20 ml/hr for 6 hrs. On saline at 75 ml/hr. - labs just sent.  23:37 K at 3.7, ordered Kcl IV to keep level > 4 Mag at 1.6: ordered replacement. Cr 1.8 Troponin stable.type 2 rise.  03:35 CBG q4 hrs ordered per RN request

## 2022-12-29 NOTE — Progress Notes (Addendum)
ANTICOAGULATION CONSULT NOTE  Pharmacy Consult for heparin infusion Indication: intraluminal thormbus, stroke protocol   Allergies  Allergen Reactions   Nitrofuran Derivatives Other (See Comments)    Transaminitis ** confounded w/Amiodarone, Mexiletine   Spironolactone     Significant transaminitis     Patient Measurements: Height: 5\' 8"  (172.7 cm) Weight: 65.2 kg (143 lb 11.8 oz) IBW/kg (Calculated) : 68.4 Heparin Dosing Weight: 65.2 kg  Vital Signs: Temp: 98.4 F (36.9 C) (07/14 0400) Temp Source: Oral (07/14 0400) BP: 99/64 (07/14 0900) Pulse Rate: 137 (07/14 0900)  Labs: Recent Labs    12/27/22 1216 12/27/22 1416 12/27/22 2138 12/28/22 0409 12/29/22 0836  HGB 9.7*  --   --  8.7* 8.4*  HCT 31.5*  --   --  26.8* 26.2*  PLT 178  --   --  141* 109*  CREATININE 1.54*  --   --  1.76* 2.12*  TROPONINIHS 29* 28* 30*  --   --     Estimated Creatinine Clearance: 25.6 mL/min (A) (by C-G formula based on SCr of 2.12 mg/dL (H)).   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 pylori infection    History of hepatitis    Hyperlipidemia    Hypertension    Liver cyst 05/16/15   PAF (paroxysmal atrial fibrillation) ([name])    Prostate CA ([name])     Medications:  Scheduled:   (feeding supplement) PROSource Plus  30 mL Oral TID BM   ascorbic acid  500 mg Oral Daily   atorvastatin  80 mg Oral Daily   calcium carbonate  750 mg Oral Daily   Chlorhexidine Gluconate Cloth  6 each Topical Daily   dextrose       fluticasone furoate-vilanterol  1 puff Inhalation Daily   gabapentin  100 mg Oral QHS   LORazepam  2 mg  Intravenous STAT   magnesium oxide  400 mg Oral Daily   melatonin  10 mg Oral QHS   midodrine  30 mg Oral TID WC   multivitamin with minerals  1 tablet Oral Daily   mupirocin ointment  1 Application Nasal BID   pantoprazole  40 mg Oral BID   polyethylene glycol  17 g Oral BID   pyridostigmine  60 mg Oral Q8H   sertraline  25 mg Oral Daily   sodium chloride flush  3 mL Intravenous Q12H   sucralfate  1 g Oral TID WC & HS   tamsulosin  0.4 mg Oral Daily   vancomycin variable dose per unstable renal function (pharmacist dosing)   Does not apply See admin instructions    Assessment: 82 y.o. 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 Admitted to step down unit for Afib with RVR, acute on chronic systolic CHF exacerbation. Last dose of apixaban 07/13 2227  Goal of Therapy:  Heparin level 0.3-0.5 units/ml aPTT 66 - 85  seconds Monitor platelets by anticoagulation protocol: Yes   Plan:  ---Start heparin infusion at 900 units/hr (no bolus given recent apixaban administration) ---Check aPTT  level (we will use aPTT levels to guide heparin infusion rate until aPTT and anti-Xa levels correlate) in 8 hours (anti-Xa level once daily) while on heparin ---Continue to monitor H&H and platelets  Lowella Bandy 12/29/2022,9:51 AM

## 2022-12-29 NOTE — Discharge Summary (Signed)
Physician Discharge Summary   Patient: Matthew Zamora MRN: [id number] DOB: 1940-06-23  Admit date:     12/27/2022  Discharge date: 12/29/22  Discharge Physician: Marcelino Duster   PCP: Jerl Mina, MD   Recommendations at discharge:    Transfer to [facility] ICU ofr neurovascular intervention by IR.  Discharge Diagnoses: Principal Problem:   Atrial fibrillation with RVR ([name]) Active Problems:   Acute on chronic respiratory failure with hypoxia ([name])   CAD (coronary artery disease)   COPD (chronic obstructive pulmonary disease) ([name])   Acute on chronic combined systolic and diastolic CHF (congestive heart failure) ([name])   CKD (chronic kidney disease) stage 3, GFR 30-59 ml/min ([name])   Elevated troponin   Known medical problems   Sepsis ([name])   Occlusive thrombus  Resolved Problems:   * No resolved hospital problems. *  Hospital Course: [name] is a 82 y.o. 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 Admitted to step down unit for Afib with RVR, acute on chronic systolic CHF exacerbation.    12/28/22 - Cardiology consulted advised Amiodarone drip. He spiked fever, has tachycardia, low BP. Sepsis protocol initiated. Avoid fluids due to his heart failure. Cultures, respiratory panel sent. Started Cefepime, Vanco. 12/29/22 - Patient is confused, has right sided weakness, did complain of neck pain. Remains tachycardic, low BP persists. He asks repeatedly to discharge him. CODE STROKE called. CTA head and neck showed occlusive left ICA thrombus. Started on Heparin drip. Discussed with Neurology, vascular surgery who recommended [facility] ICU transfer for IR intervention. I updated patient's daughter in law regarding current plan.  Assessment and Plan: Sepsis unknown origin- Blood cultures positive for Staph aureus. Continue Cefepime, Vancomycin per pharmacy  protocol. IV fluids held due to CHF presentation. His BP seems chronically low, on midodrine therapy at home. He is now on low dose Levophed to maintain SBP greater than 100. Monitor vitals closely.  Altered mental status. Right sided weakness Occlusive Left ICA thrombus- Seen on CTA head and neck.  Discussed with Neurology, vascular surgery who recommended [facility] ICU transfer for IR intervention. Patient to go to IR suite directly from [facility] for intervention. He is at high risk for sudden clinical deterioration, will need neuro ICU monitoring. Levophed to maintain SBP >100.   Acute on chronic combined systolic and diastolic CHF (congestive heart failure) [facility]) Cardiology on board initially advised diuretic but due to his sepsis, held Lasix. Monitor I's and O's and daily weights Telemetry. Replete lites as needed.  Atrial fibrillation with RVR ([name]) Hold diltiazem drip, Cardiology started Amiodarone drip yesterday with bolus. His HR around 130, stopped Amiodarone. Continue to closely monitor. Eliquis changed to Heparin drip.   Acute on chronic respiratory failure with hypoxia ([name]) Currently on 4 L nasal cannula above his usual baseline at 3 L, wean as tolerated. Continue duonebs, diuresis as needed.   COPD- Got solumedrol in ED. Continue duonebs. Taper oxygen to 3L.   Elevated troponin- Secondary to demand ischemia in the setting of sepsis, CHF, Afib        Consultants: Neurology, ICU Procedures performed: none  Disposition:  [facility] ICU Diet recommendation:  Cardiac diet   Current Facility-Administered Medications:    (feeding supplement) PROSource Plus liquid 30 mL, 30 mL, Oral, TID BM, Venora Maples, MD, 30 mL at 12/28/22 1733   0.9 %  sodium chloride infusion, , Intravenous, PRN, Lexxus [place],  Lynne Logan, MD, Paused at 12/29/22 0343   0.9 %  sodium chloride infusion, 250 mL, Intravenous, Continuous, [place], Hubbard Hartshorn, NP   acetaminophen (TYLENOL)  tablet 650 mg, 650 mg, Oral, Q6H PRN, 650 mg at 12/28/22 1923 **OR** acetaminophen (TYLENOL) suppository 650 mg, 650 mg, Rectal, Q6H PRN, Venora Maples, MD   albuterol (PROVENTIL) (2.5 MG/3ML) 0.083% nebulizer solution 2.5 mg, 2.5 mg, Inhalation, PRN, Venora Maples, MD   [COMPLETED] amiodarone (NEXTERONE) 1.8 mg/mL load via infusion 150 mg, 150 mg, Intravenous, Once, 150 mg at 12/28/22 0548 **FOLLOWED BY** amiodarone (NEXTERONE PREMIX) 360-4.14 MG/200ML-% (1.8 mg/mL) IV infusion, 60 mg/hr, Intravenous, Continuous **FOLLOWED BY** [DISCONTINUED] amiodarone (NEXTERONE PREMIX) 360-4.14 MG/200ML-% (1.8 mg/mL) IV infusion, 30 mg/hr, Intravenous, Continuous, Mansy, Jan A, MD   ascorbic acid (VITAMIN C) tablet 500 mg, 500 mg, Oral, Daily, Venora Maples, MD, 500 mg at 12/28/22 1730   atorvastatin (LIPITOR) tablet 80 mg, 80 mg, Oral, Daily, Venora Maples, MD, 80 mg at 12/28/22 1730   calcium carbonate (TUMS - dosed in mg elemental calcium) chewable tablet 750 mg, 750 mg, Oral, Daily, Venora Maples, MD, 750 mg at 12/28/22 1742   ceFEPIme (MAXIPIME) 2 g in sodium chloride 0.9 % 100 mL IVPB, 2 g, Intravenous, Q12H, Sharen Hones, RPH, Stopped at 12/29/22 0324   Chlorhexidine Gluconate Cloth 2 % PADS 6 each, 6 each, Topical, Daily, Mansy, Jan A, MD, 6 each at 12/28/22 1230   dextrose 5 %-0.9 % sodium chloride infusion, , Intravenous, Continuous, Mansy, Jan A, MD, Last Rate: 100 mL/hr at 12/29/22 1000, Infusion Verify at 12/29/22 1000   dextrose 50 % solution, , , ,    fluticasone furoate-vilanterol (BREO ELLIPTA) 200-25 MCG/ACT 1 puff, 1 puff, Inhalation, Daily, Venora Maples, MD, 1 puff at 12/28/22 1053   gabapentin (NEURONTIN) capsule 100 mg, 100 mg, Oral, QHS, Venora Maples, MD, 100 mg at 12/28/22 2220   heparin ADULT infusion 100 units/mL (25000 units/225mL), 900 Units/hr, Intravenous, Continuous, Lowella Bandy, RPH, Last Rate: 9 mL/hr at 12/29/22 1047, 900 Units/hr at  12/29/22 1047   HYDROmorphone (DILAUDID) injection 0.5-1 mg, 0.5-1 mg, Intravenous, Q2H PRN, Venora Maples, MD, 1 mg at 12/28/22 0451   magnesium oxide (MAG-OX) tablet 400 mg, 400 mg, Oral, Daily, Venora Maples, MD, 400 mg at 12/28/22 1403   melatonin tablet 10 mg, 10 mg, Oral, QHS, Venora Maples, MD, 10 mg at 12/28/22 2227   midodrine (PROAMATINE) tablet 30 mg, 30 mg, Oral, TID WC, Venora Maples, MD, 30 mg at 12/28/22 1729   morphine (PF) 2 MG/ML injection 1-2 mg, 1-2 mg, Intravenous, Q4H PRN, Mansy, Jan A, MD, 2 mg at 12/29/22 0423   multivitamin with minerals tablet 1 tablet, 1 tablet, Oral, Daily, Venora Maples, MD   mupirocin ointment (BACTROBAN) 2 % 1 Application, 1 Application, Nasal, BID, Mansy, Jan A, MD, 1 Application at 12/29/22 1126   norepinephrine (LEVOPHED) 4mg  in (0.016 mg/mL) premix infusion, 2-10 mcg/min, Intravenous, Titrated, [place], Hubbard Hartshorn, NP, Last Rate: 7.5 mL/hr at 12/29/22 1000, 2 mcg/min at 12/29/22 1000   ondansetron (ZOFRAN) tablet 4 mg, 4 mg, Oral, Q6H PRN **OR** ondansetron (ZOFRAN) injection 4 mg, 4 mg, Intravenous, Q6H PRN, Venora Maples, MD, 4 mg at 12/29/22 0428   oxyCODONE (Oxy IR/ROXICODONE) immediate release tablet 5 mg, 5 mg, Oral, Q4H PRN, Venora Maples, MD, 5 mg at 12/29/22 0352   pantoprazole (PROTONIX) EC tablet 40 mg, 40 mg,  Oral, BID, Venora Maples, MD, 40 mg at 12/28/22 2220   polyethylene glycol (MIRALAX / GLYCOLAX) packet 17 g, 17 g, Oral, BID, Venora Maples, MD, 17 g at 12/28/22 2219   polyethylene glycol (MIRALAX / GLYCOLAX) packet 17 g, 17 g, Oral, Daily PRN, Venora Maples, MD   pyridostigmine (MESTINON) tablet 60 mg, 60 mg, Oral, Q8H, Venora Maples, MD, 60 mg at 12/29/22 0559   sertraline (ZOLOFT) tablet 25 mg, 25 mg, Oral, Daily, Venora Maples, MD, 25 mg at 12/28/22 0931   sodium chloride flush (NS) 0.9 % injection 3 mL, 3 mL, Intravenous, Q12H, Venora Maples, MD, 3 mL  at 12/29/22 0915   sucralfate (CARAFATE) 1 GM/10ML suspension 1 g, 1 g, Oral, TID WC & HS, Venora Maples, MD, 1 g at 12/28/22 2227   tamsulosin (FLOMAX) capsule 0.4 mg, 0.4 mg, Oral, Daily, Venora Maples, MD, 0.4 mg at 12/28/22 1438   traZODone (DESYREL) tablet 50 mg, 50 mg, Oral, QHS PRN, Venora Maples, MD   vancomycin (VANCOREADY) IVPB 750 mg/150 mL, 750 mg, Intravenous, Once, Lowella Bandy, RPH   vancomycin variable dose per unstable renal function (pharmacist dosing), , Does not apply, See admin instructions, Sharen Hones [facility]   Discharge Exam: Filed Weights   12/27/22 1214 12/27/22 2048 12/28/22 0411  Weight: 59 kg 65.2 kg 65.2 kg   General - Elderly Caucasian male, confused in distress. HEENT - PERRLA, EOMI, atraumatic head, non tender sinuses. Lung - Clear, bibasilar Rales, diffuse rhonchi noted. Heart - S1, S2 heard, no murmurs, rubs, trace pedal edema. Abdomen soft, distended, bowel sounds good Neuro - Alert, awake confused, right sided weakness 2/5. Skin - Warm and dry.  Condition at discharge: critical  The results of significant diagnostics from this hospitalization (including imaging, microbiology, ancillary and laboratory) are listed below for reference.   Imaging Studies: CT ANGIO HEAD NECK W WO CM W PERF (CODE STROKE)  Result Date: 12/29/2022 CLINICAL DATA:  82 year old male inpatient code stroke presentation. Right side weakness and aphasia. EXAM: CT ANGIOGRAPHY HEAD AND NECK WITH AND WITHOUT CONTRAST TECHNIQUE: Multidetector CT imaging of the head and neck was performed using the standard protocol during bolus administration of intravenous contrast. Multiplanar CT image reconstructions and MIPs were obtained to evaluate the vascular anatomy. Carotid stenosis measurements (when applicable) are obtained utilizing NASCET criteria, using the distal internal carotid diameter as the denominator. RADIATION DOSE REDUCTION: This exam was performed according  to the departmental dose-optimization program which includes automated exposure control, adjustment of the mA and/or kV according to patient size and/or use of iterative reconstruction technique. CONTRAST:  60mL OMNIPAQUE IOHEXOL 350 MG/ML SOLN COMPARISON:  Plain head CT 10/30/2022. FINDINGS: CT HEAD Brain: No midline shift, ventriculomegaly, mass effect, evidence of mass lesion, intracranial hemorrhage or evidence of cortically based acute infarction. Gray-white differentiation appears stable, symmetric, within normal limits for age. ASPECTS 10. Calvarium and skull base: No acute osseous abnormality identified. Paranasal sinuses: Visualized paranasal sinuses and mastoids are stable and well aerated. Orbits: No acute orbit or scalp soft tissue finding. CTA NECK Skeleton: Mild mandible motion artifact. Cervical spine degeneration although generally mild for age. No acute osseous abnormality identified. Upper chest: Left chest pacemaker. Small layering pleural effusions. Mild respiratory motion, mild paraseptal emphysema. Grossly patent visible central pulmonary arteries. Other neck: No acute neck soft tissue finding. Aortic arch: 3 vessel arch. Moderate Calcified aortic atherosclerosis. Right carotid system: Brachiocephalic artery and proximal right CCA are  patent with mild plaque and no stenosis. Mild to moderate soft and calcified plaque at the right ICA origin and bulb with no stenosis. Left carotid system: Mild to moderate soft and calcified plaque at the left CCA origin without stenosis. Mild motion artifact. Occasional other left CCA plaque before the bifurcation without stenosis. Moderate calcified plaque at the left ICA origin and bulb. No significant ICA origin stenosis, but beginning at the medial distal bulb there is adherent thrombus within the vessel over a segment of nearly 2 cm. The string like thrombus continues within the lumen to series 9, image 93. The vessel remains patent, but at the distal bulb  high-grade stenosis is numerically estimated at 80 % with respect to the distal vessel, series 11, image 195. Superimposed calcified plaque of the left ICA just below the skull base. Vertebral arteries: Proximal subclavian arteries and vertebral artery origins are patent with no significant stenosis despite plaque. The vertebral arteries are diminutive, fairly codominant, and remain patent to the skull base. CTA HEAD Posterior circulation: Distal vertebral arteries and vertebrobasilar junction are diminutive but patent. Left V4 is mildly dominant. Patent bilateral PICA/AICA. Patent basilar artery without stenosis. Patent basilar tip and SCA origins. Fetal type bilateral PCA origins. Tortuous posterior communicating arteries. Bilateral PCA branches remain patent. On the left there is mild to moderate P3 irregularity and stenosis on series 15, image 29. Anterior circulation: Both ICA siphons are patent and enhance symmetrically. Moderate left siphon calcified plaque. Only mild left siphon stenosis. Normal left posterior communicating artery origin. Similar moderate right siphon calcified plaque. Mild supraclinoid right siphon stenosis. Normal right posterior communicating artery origin. Patent carotid termini. Patent MCA and ACA origins. Anterior communicating artery is within normal limits. Bilateral ACA branches are within normal limits. Right MCA M1 segment and bifurcation are patent without stenosis. Left MCA M1 segment and bifurcation are patent without stenosis. No discrete MCA branch occlusion is identified. Venous sinuses: Patent. Anatomic variants: Diminutive vertebrobasilar system on the basis of fetal PCA origins. Review of the MIP images confirms the above findings Preliminary report of the above Study discussed by telephone with Dr. Ritta Slot on 12/29/2022 at 0930 hours. IMPRESSION: 1. CTA is negative for large vessel occlusion but Positive for bulky, elongated thrombus within the Left ICA  beginning at the distal bulb. The vessel remains patent, but with high-grade estimated 80% stenosis. This was discussed by telephone with Dr. Ritta Slot on 12/29/2022 at 0943 hours. 2. Stable and negative for age noncontrast CT appearance of the brain. 3. Atherosclerosis in the head and neck but generally only mild subsequent stenosis. Up to Moderate Left PCA P3 branch stenosis. 4. Aortic Atherosclerosis (ICD10-I70.0). Small layering pleural effusions. Electronically Signed   By: Odessa Fleming M.D.   On: 12/29/2022 09:52   ECHOCARDIOGRAM COMPLETE  Result Date: 12/29/2022    ECHOCARDIOGRAM REPORT   Patient Name:   SHURMAN PRITCHARD Date of Exam: 12/29/2022 Medical Rec #:  [id number]           Height:       68.0 in Accession #:    [id number]          Weight:       143.7 lb Date of Birth:  1941/05/13           BSA:          1.776 m Patient Age:    81 years            BP:  77/49 mmHg Patient Gender: M                   HR:           133 bpm. Exam Location:  [facility] Procedure: 2D Echo Indications:     CHF I50.21  History:         Patient has prior history of Echocardiogram examinations, most                  recent 11/10/2022.  Sonographer:     Overton Mam RDCS, FASE Referring Phys:  [id number] Hubbard Hartshorn OUMA Diagnosing Phys: Chilton Si MD  Sonographer Comments: Technically challenging study due to limited acoustic windows and suboptimal parasternal window. Image acquisition challenging due to uncooperative patient. Unable to perform a complete echo at this time due to patient being extremely uncooperative. IMPRESSIONS  1. Global hypokinesis worse in the septum. Left ventricular ejection fraction, by estimation, is 30 to 35%. The left ventricle has moderately decreased function. The left ventricle demonstrates global hypokinesis. There is mild left ventricular hypertrophy of the septal segment. Left ventricular diastolic parameters are indeterminate.  2. Right ventricular systolic function is  normal. The right ventricular size is normal.  3. The mitral valve is normal in structure. Mild mitral valve regurgitation. No evidence of mitral stenosis.  4. The aortic valve is normal in structure. Aortic valve regurgitation is not visualized. No aortic stenosis is present.  5. The inferior vena cava is normal in size with greater than 50% respiratory variability, suggesting right atrial pressure of 3 mmHg. FINDINGS  Left Ventricle: Global hypokinesis worse in the septum. Left ventricular ejection fraction, by estimation, is 30 to 35%. The left ventricle has moderately decreased function. The left ventricle demonstrates global hypokinesis. The left ventricular internal cavity size was normal in size. There is mild left ventricular hypertrophy of the septal segment. Left ventricular diastolic parameters are indeterminate. Right Ventricle: The right ventricular size is normal. No increase in right ventricular wall thickness. Right ventricular systolic function is normal. Left Atrium: Left atrial size was normal in size. Right Atrium: Right atrial size was normal in size. Pericardium: There is no evidence of pericardial effusion. Mitral Valve: The mitral valve is normal in structure. Mild mitral valve regurgitation. No evidence of mitral valve stenosis. Tricuspid Valve: The tricuspid valve is normal in structure. Tricuspid valve regurgitation is trivial. No evidence of tricuspid stenosis. Aortic Valve: The aortic valve is normal in structure. Aortic valve regurgitation is not visualized. No aortic stenosis is present. Aortic valve peak gradient measures 7.2 mmHg. Pulmonic Valve: The pulmonic valve was normal in structure. Pulmonic valve regurgitation is not visualized. No evidence of pulmonic stenosis. Aorta: The aortic root is normal in size and structure. Venous: The inferior vena cava is normal in size with greater than 50% respiratory variability, suggesting right atrial pressure of 3 mmHg. IAS/Shunts: No atrial  level shunt detected by color flow Doppler.  LEFT VENTRICLE PLAX 2D LVIDd:         5.20 cm     Diastology LVIDs:         4.40 cm     LV e' medial:    8.38 cm/s LV PW:         1.00 cm     LV E/e' medial:  13.6 LV IVS:        1.20 cm     LV e' lateral:   13.90 cm/s LVOT diam:     1.80 cm  LV E/e' lateral: 8.2 LVOT Area:     2.54 cm  LV Volumes (MOD) LV vol d, MOD A4C: 74.3 ml LV vol s, MOD A4C: 48.7 ml LV SV MOD A4C:     74.3 ml LEFT ATRIUM         Index LA diam:    4.00 cm 2.25 cm/m  AORTIC VALVE              PULMONIC VALVE AV Vmax:      134.00 cm/s PV Vmax:       0.93 m/s AV Peak Grad: 7.2 mmHg    PV Peak grad:  3.5 mmHg  AORTA Ao Root diam: 3.60 cm MITRAL VALVE                TRICUSPID VALVE MV Area (PHT): 6.02 cm     TR Peak grad:   23.2 mmHg MV Decel Time: 126 msec     TR Vmax:        241.00 cm/s MV E velocity: 114.00 cm/s                             SHUNTS                             Systemic Diam: 1.80 cm Chilton Si MD Electronically signed by Chilton Si MD Signature Date/Time: 12/29/2022/9:43:31 AM    Final    CT Angio Chest PE W and/or Wo Contrast  Result Date: 12/27/2022 CLINICAL DATA:  Pulmonary embolus suspected with high probability. Shortness of breath. COPD. EXAM: CT ANGIOGRAPHY CHEST WITH CONTRAST TECHNIQUE: Multidetector CT imaging of the chest was performed using the standard protocol during bolus administration of intravenous contrast. Multiplanar CT image reconstructions and MIPs were obtained to evaluate the vascular anatomy. RADIATION DOSE REDUCTION: This exam was performed according to the departmental dose-optimization program which includes automated exposure control, adjustment of the mA and/or kV according to patient size and/or use of iterative reconstruction technique. CONTRAST:  75mL OMNIPAQUE IOHEXOL 350 MG/ML SOLN COMPARISON:  Chest radiograph 12/17/2022.  CT 11/10/2022. FINDINGS: Cardiovascular: Good opacification of the central and segmental pulmonary arteries  resulting in technically adequate study. No focal filling defects. No evidence of significant pulmonary embolus. Normal heart size. No pericardial effusions. Normal caliber thoracic aorta. Calcification of the aorta and coronary arteries. Mediastinum/Nodes: Thyroid gland is unremarkable. Esophagus is decompressed. No significant lymphadenopathy. Lungs/Pleura: Emphysematous changes and scattered fibrosis in the lungs. Bronchial wall thickening, subpleural fibrosis and ground-glass changes likely representing changes of respiratory bronchiolitis. No pleural effusions. No pneumothorax. Upper Abdomen: Cholelithiasis with small stone in the gallbladder. No inflammatory infiltration demonstrated. Mass in the inferior right lobe of the liver measuring 4.9 x 5.5 cm in diameter. This has been present on previous studies including CT abdomen and pelvis from 03/27/2022 and dating back to 05/16/2015. Lesion is slowly growing but long-term presence of the lesion suggest likely benign etiology. This could represent a biliary cystadenoma. The lesion is incompletely visualized and not well characterized on this study which was optimized for evaluation of pulmonary arteries. Musculoskeletal: No acute bony abnormalities. Old right rib fractures. Review of the MIP images confirms the above findings. IMPRESSION: 1. No evidence of significant pulmonary embolus. 2. 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 3. Cholelithiasis without inflammatory change. 4. Cystic lesion in the liver is incompletely visualized but present on prior studies. Likely benign.  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 5. Aortic atherosclerosis. Electronically Signed   By: Burman Nieves M.D.   On: 12/27/2022 17:48   DG Chest 2 View  Result Date: 12/27/2022 CLINICAL DATA:  Shortness of breath EXAM: CHEST - 2 VIEW COMPARISON:  Previous studies including the examination of 12/17/2022 FINDINGS: Transverse diameter of heart is increased.  Increase in AP diameter of chest suggests COPD. Thoracic aorta is tortuous and ectatic. There are no signs of pulmonary edema or focal pulmonary consolidation. There is interval clearing of vascular congestion. There is no pleural effusion or pneumothorax. Pacemaker/defibrillator battery is seen in the left infraclavicular region. IMPRESSION: Cardiomegaly. There are no signs of pulmonary edema or focal pulmonary consolidation. Electronically Signed   By: Ernie Avena M.D.   On: 12/27/2022 12:35   DG Chest Portable 1 View  Result Date: 12/17/2022 CLINICAL DATA:  Shortness of breath EXAM: PORTABLE CHEST 1 VIEW COMPARISON:  11/10/2022 FINDINGS: Cardiac shadow is stable. Defibrillator is again noted. Lungs are well aerated without focal infiltrate. Mild vascular congestion is seen. IMPRESSION: Mild vascular congestion.  No focal infiltrate is noted. Electronically Signed   By: Alcide Clever M.D.   On: 12/17/2022 02:43    Microbiology: Results for orders placed or performed during the hospital encounter of 12/27/22  MRSA Next Gen by PCR, Nasal     Status: Abnormal   Collection Time: 12/27/22  9:38 PM   Specimen: Nasal Mucosa; Nasal Swab  Result Value Ref Range Status   MRSA by PCR Next Gen DETECTED (A) NOT DETECTED Final    Comment: CRITICAL RESULT CALLED TO, READ BACK BY AND VERIFIED WITH: MELISSA COBB RN @ [phone] 12/28/22 [facility] (NOTE) The GeneXpert MRSA Assay (FDA approved for NASAL specimens only), is one component of a comprehensive MRSA colonization surveillance program. It is not intended to diagnose MRSA infection nor to guide or monitor treatment for MRSA infections. Test performance is not FDA approved in patients less than 7 years old. Performed at [facility], [address] [place], [place] [address]   Culture, blood (Routine X 2) w Reflex to ID Panel     Status: None (Preliminary result)   Collection Time: 12/28/22 10:47 AM   Specimen: BLOOD RIGHT HAND  Result Value  Ref Range Status   Specimen Description BLOOD RIGHT HAND  Final   Special Requests   Final    BOTTLES DRAWN AEROBIC AND ANAEROBIC Blood Culture adequate volume   Culture  Setup Time   Final    GRAM POSITIVE COCCI IN BOTH AEROBIC AND ANAEROBIC BOTTLES CRITICAL RESULT CALLED TO, READ BACK BY AND VERIFIED WITH: TREY GREENWOOD @ 2146 12/28/22 [facility] Performed at [facility], [address], [place], [place] [address]    Culture GRAM POSITIVE COCCI  Final   Report Status PENDING  Incomplete  Culture, blood (Routine X 2) w Reflex to ID Panel     Status: None (Preliminary result)   Collection Time: 12/28/22 10:48 AM   Specimen: BLOOD LEFT HAND  Result Value Ref Range Status   Specimen Description BLOOD LEFT HAND  Final   Special Requests   Final    BOTTLES DRAWN AEROBIC AND ANAEROBIC Blood Culture adequate volume   Culture  Setup Time   Final    GRAM POSITIVE COCCI IN BOTH AEROBIC AND ANAEROBIC BOTTLES Organism ID to follow CRITICAL RESULT CALLED TO, READ BACK BY AND VERIFIED WITH: TREY GREENWOOD @2146  12/28/22 LFD Performed at [facility], [address] [place], [place] [address]  Culture GRAM POSITIVE COCCI  Final   Report Status PENDING  Incomplete  Blood Culture ID Panel (Reflexed)     Status: Abnormal   Collection Time: 12/28/22 10:48 AM  Result Value Ref Range Status   Enterococcus faecalis NOT DETECTED NOT DETECTED Final   Enterococcus Faecium NOT DETECTED NOT DETECTED Final   Listeria monocytogenes NOT DETECTED NOT DETECTED Final   Staphylococcus species DETECTED (A) NOT DETECTED Final    Comment: CRITICAL RESULT CALLED TO, READ BACK BY AND VERIFIED WITH: TREY GREENWOOD @ 2146 12/28/22 LFD    Staphylococcus aureus (BCID) DETECTED (A) NOT DETECTED Final    Comment: Methicillin (oxacillin)-resistant Staphylococcus aureus (MRSA). MRSA is predictably resistant to beta-lactam antibiotics (except ceftaroline). Preferred therapy is vancomycin unless  clinically contraindicated. Patient requires contact precautions if  hospitalized. CRITICAL RESULT CALLED TO, READ BACK BY AND VERIFIED WITH: TREY GREENWOOD @ 2146 12/28/22 LFD    Staphylococcus epidermidis NOT DETECTED NOT DETECTED Final   Staphylococcus lugdunensis NOT DETECTED NOT DETECTED Final   Streptococcus species NOT DETECTED NOT DETECTED Final   Streptococcus agalactiae NOT DETECTED NOT DETECTED Final   Streptococcus pneumoniae NOT DETECTED NOT DETECTED Final   Streptococcus pyogenes NOT DETECTED NOT DETECTED Final   A.calcoaceticus-baumannii NOT DETECTED NOT DETECTED Final   Bacteroides fragilis NOT DETECTED NOT DETECTED Final   Enterobacterales NOT DETECTED NOT DETECTED Final   Enterobacter cloacae complex NOT DETECTED NOT DETECTED Final   Escherichia coli NOT DETECTED NOT DETECTED Final   Klebsiella aerogenes NOT DETECTED NOT DETECTED Final   Klebsiella oxytoca NOT DETECTED NOT DETECTED Final   Klebsiella pneumoniae NOT DETECTED NOT DETECTED Final   Proteus species NOT DETECTED NOT DETECTED Final   Salmonella species NOT DETECTED NOT DETECTED Final   Serratia marcescens NOT DETECTED NOT DETECTED Final   Haemophilus influenzae NOT DETECTED NOT DETECTED Final   Neisseria meningitidis NOT DETECTED NOT DETECTED Final   Pseudomonas aeruginosa NOT DETECTED NOT DETECTED Final   Stenotrophomonas maltophilia NOT DETECTED NOT DETECTED Final   Candida albicans NOT DETECTED NOT DETECTED Final   Candida auris NOT DETECTED NOT DETECTED Final   Candida glabrata NOT DETECTED NOT DETECTED Final   Candida krusei NOT DETECTED NOT DETECTED Final   Candida parapsilosis NOT DETECTED NOT DETECTED Final   Candida tropicalis NOT DETECTED NOT DETECTED Final   Cryptococcus neoformans/gattii NOT DETECTED NOT DETECTED Final   Meth resistant mecA/C and MREJ DETECTED (A) NOT DETECTED Final    Comment: CRITICAL RESULT CALLED TO, READ BACK BY AND VERIFIED WITH: TREY GREENWOOD @ 2146 12/28/22  LFD Performed at [facility], [address] [place], [place] [address]   Respiratory (~20 pathogens) panel by PCR     Status: None   Collection Time: 12/28/22 11:55 AM   Specimen: Nasopharyngeal Swab; Respiratory  Result Value Ref Range Status   Adenovirus NOT DETECTED NOT DETECTED Final   Coronavirus 229E NOT DETECTED NOT DETECTED Final    Comment: (NOTE) The Coronavirus on the Respiratory Panel, DOES NOT test for the novel  Coronavirus (2019 nCoV)    Coronavirus HKU1 NOT DETECTED NOT DETECTED Final   Coronavirus NL63 NOT DETECTED NOT DETECTED Final   Coronavirus OC43 NOT DETECTED NOT DETECTED Final   Metapneumovirus NOT DETECTED NOT DETECTED Final   Rhinovirus / Enterovirus NOT DETECTED NOT DETECTED Final   Influenza A NOT DETECTED NOT DETECTED Final   Influenza B NOT DETECTED NOT DETECTED Final   Parainfluenza Virus 1 NOT DETECTED NOT DETECTED Final   Parainfluenza Virus  2 NOT DETECTED NOT DETECTED Final   Parainfluenza Virus 3 NOT DETECTED NOT DETECTED Final   Parainfluenza Virus 4 NOT DETECTED NOT DETECTED Final   Respiratory Syncytial Virus NOT DETECTED NOT DETECTED Final   Bordetella pertussis NOT DETECTED NOT DETECTED Final   Bordetella Parapertussis NOT DETECTED NOT DETECTED Final   Chlamydophila pneumoniae NOT DETECTED NOT DETECTED Final   Mycoplasma pneumoniae NOT DETECTED NOT DETECTED Final    Comment: Performed at [facility], [address] [place], [place] [address]    Labs: CBC: Recent Labs  Lab 12/27/22 1216 12/28/22 0409 12/29/22 0836  WBC 10.9* 20.6* 12.9*  HGB 9.7* 8.7* 8.4*  HCT 31.5* 26.8* 26.2*  MCV 94.9 91.8 92.6  PLT 178 141* 109*   Basic Metabolic Panel: Recent Labs  Lab 12/27/22 1216 12/28/22 0409 12/29/22 0836  NA 138 134* 132*  K 4.2 4.1 3.7  CL 105 103 102  CO2 25 24 21*  GLUCOSE 108* 102* 97  BUN 19 29* 39*  CREATININE 1.54* 1.76* 2.12*  CALCIUM 9.4 8.4* 7.7*  MG 1.9  --   --    Liver Function  Tests: Recent Labs  Lab 12/27/22 1216 12/28/22 0409 12/29/22 0836  AST 25 22 28   ALT 25 21 20   ALKPHOS 81 66 53  BILITOT 0.6 0.9 0.9  PROT 6.2* 5.7* 5.1*  ALBUMIN 3.5 3.3* 2.8*   CBG: Recent Labs  Lab 12/27/22 2008 12/29/22 0333 12/29/22 0408 12/29/22 0848  GLUCAP 115* 64* 107* 93    Discharge time spent: greater than 30 minutes.  Signed: Marcelino Duster, MD [facility] 12/29/2022

## 2022-12-29 NOTE — Progress Notes (Signed)
Stent Deployed

## 2022-12-29 NOTE — Progress Notes (Signed)
Primary RN called me to bedside to assess pt around 0845 this morning d/t right sided weakness and difficulty speaking. Pt was confused and slightly agitated on my arrival to bedside. Pt had similar episode overnight but had resolved. Hospitalist is at bedside as well performing neuro assessment. Code stroke was called at 0854 per MD. Dr. Amada Jupiter, neurology, arrived to bedside to assess pt. Pt was takes to CT scan by primary RN, this RN, and [facility] @ [id number]. Last known well is unknown d/t fluctuation of symptoms. Pt hypotensive and required levophed during CT scan. Pt also became very agitated when moving him to CT table and 2mg  of Ativan given per Dr. Amada Jupiter. Pt's agitation resolved for CT. Pt taken back to ICU 18 at 0945. Pt is drowsy after ativan administration. Pt continues to have right sided weakness and confusion. Orders received for NIH Q1H and keep SBP greater that 100. Dr. Amada Jupiter says pt will be transferred to [facility]. Pt remains on levophed and heparin gtt started.

## 2022-12-29 NOTE — Progress Notes (Signed)
  82yo M admitted for initially thought to be heart failure exacerbation but then had fevers with leukocytosis. Infectious work up revealed MRSA+ bacteremia. Also being evaluated by neurology for code stroke.  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 Formal consult to follow tomorrow   Recommend to narrow abtx to cefepime    [place] [facility]icrobial Management Team Staphylococcus aureus bacteremia   Staphylococcus aureus bacteremia (SAB) is associated with a high rate of complications and mortality.  Specific aspects of clinical management are critical to optimizing the outcome of patients with SAB.  Therefore, the [facility] Antimicrobial Management Team [facility]) has initiated an intervention aimed at improving the management of SAB at [facility].  To do so, Infectious Diseases physicians are providing an evidence-based consult for the management of all patients with SAB.     Yes No Comments  Perform follow-up blood cultures (even if the patient is afebrile) to ensure clearance of bacteremia []  []  Will schedule for tomorrow  Remove vascular catheter and obtain follow-up blood cultures after the removal of the catheter []  []    Perform echocardiography to evaluate for endocarditis (transthoracic ECHO is 40-50% sensitive, TEE is > 90% sensitive) [x]  []  TTE done on 7/14  Consult electrophysiologist to evaluate implanted cardiac device (pacemaker, ICD) []  []    Ensure source control []  []  Have all abscesses been drained effectively? Have deep seeded infections (septic joints or osteomyelitis) had appropriate surgical debridement?  Investigate for "metastatic" sites of infection []  []  Does the patient have ANY symptom or physical exam finding that would suggest a deeper infection (back or neck pain that may be suggestive of vertebral osteomyelitis or epidural abscess, muscle pain that could be a symptom of pyomyositis)?  Keep in mind that  for deep seeded infections MRI imaging with contrast is preferred rather than other often insensitive tests such as plain x-rays, especially early in a patient's presentation.  Change antibiotic therapy to ___vancomycin_______________ x []  Beta-lactam antibiotics are preferred for MSSA due to higher cure rates.   If on Vancomycin, goal trough should be 15 - 20 mcg/mL  Estimated duration of IV antibiotic therapy:   []  []  Consult case management for probably prolonged outpatient IV antibiotic therapy

## 2022-12-29 NOTE — Consult Note (Signed)
NEURO HOSPITALIST CONSULT NOTE   Requestig physician: Dr. 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 male presenting in transfer from [facility] for emergent thrombectomy to treat symptomatic acute partial occlusion of his left ICA.   HPI from Dr. Alene Mires note this AM has been reviewed:  "Matthew Zamora is a 82 y.o. male with a history of hypertension, hyperlipidemia who is currently hospitalized with sepsis and has been found to have MRSA bacteremia.  Overnight, a code stroke was initially activated, and then canceled due to resolution of symptoms.  On initial assessment by the nurse this morning, he seemed confused, but was not until she noticed right-sided weakness that occurred stroke was activated. Also of note, he was complaining of severe neck pain yesterday."  The patient was not a TNK candidate due to time criteria. Imaging revealed significant stenosis from a nonocclusive thrombus in the left ICA. He was then sent to [facility] for emergent thrombectomy.     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 pylori infection    History of hepatitis    Hyperlipidemia    Hypertension    Liver cyst 05/16/15   PAF (paroxysmal atrial fibrillation) ([name])    Prostate CA [facility])     Past Surgical History:  Procedure Laterality Date    CATARACT EXTRACTION     COLONOSCOPY  10/06/2014, 09/18/2004, 03/14/2009   ESOPHAGOGASTRODUODENOSCOPY  10/06/2014, 03/18/2001, 03/14/2009   ESOPHAGOGASTRODUODENOSCOPY (EGD) WITH PROPOFOL N/A 05/07/2018   Procedure: ESOPHAGOGASTRODUODENOSCOPY (EGD) WITH PROPOFOL;  Surgeon: Toledo, Boykin Nearing, MD;  Location: [facility] ENDOSCOPY;  Service: Gastroenterology;  Laterality: N/A;   ESOPHAGOGASTRODUODENOSCOPY (EGD) WITH PROPOFOL N/A 04/22/2022   Procedure: ESOPHAGOGASTRODUODENOSCOPY (EGD) WITH PROPOFOL;  Surgeon: Toney Reil, MD;  Location: [facility] ENDOSCOPY;  Service: Endoscopy;  Laterality: N/A;   FLEXIBLE SIGMOIDOSCOPY  08/26/1990   HIP ARTHROPLASTY Right 10/31/2022   Procedure: ARTHROPLASTY BIPOLAR HIP (HEMIARTHROPLASTY)-RNFA;  Surgeon: Reinaldo Berber, MD;  Location: [facility] ORS;  Service: Orthopedics;  Laterality: Right;   INSERTION OF ICD     PROSTATE SURGERY  TONSILLECTOMY      Family History  Problem Relation Age of Onset   Heart attack Mother    Heart attack Father             Social History:  reports that he quit smoking about 23 years ago. His smoking use included cigarettes. He started smoking about 61 years ago. He has a 38 pack-year smoking history. He has never used smokeless tobacco. He reports that he does not currently use drugs. He reports that he does not drink alcohol.  Allergies  Allergen Reactions   Nitrofuran Derivatives Other (See Comments)    Transaminitis ** confounded w/Amiodarone, Mexiletine   Spironolactone     Significant transaminitis     MEDICATIONS:                                                                                                                     Prior to Admission:  Medications Prior to Admission  Medication Sig Dispense Refill Last Dose   acetaminophen (TYLENOL) 325 MG tablet Take 2 tablets (650 mg total) by mouth every 4 (four) hours as needed for headache or mild pain.      albuterol (PROVENTIL) (2.5 MG/3ML) 0.083% nebulizer solution Inhale 3  mLs (2.5 mg total) into the lungs as needed for shortness of breath. 75 mL 12    ascorbic acid (VITAMIN C) 500 MG tablet Take 500 mg by mouth daily.      atorvastatin (LIPITOR) 80 MG tablet Take 80 mg by mouth daily.      calcium carbonate (TUMS EX) 750 MG chewable tablet Chew 1 tablet by mouth daily.      ELIQUIS 2.5 MG TABS tablet Take 2.5 mg by mouth 2 (two) times daily.      fluticasone-salmeterol (ADVAIR) 500-50 MCG/ACT AEPB Inhale 1 puff into the lungs in the morning and at bedtime.      gabapentin (NEURONTIN) 100 MG capsule Take 100 mg by mouth at bedtime.      guaiFENesin-dextromethorphan (ROBITUSSIN DM) 100-10 MG/5ML syrup Take 5 mLs by mouth every 4 (four) hours as needed for cough. 118 mL 0    magnesium oxide (MAG-OX) 400 (240 Mg) MG tablet Take 1 tablet by mouth daily.      melatonin 3 MG TABS tablet Take 9 mg by mouth at bedtime.      metoCLOPramide (REGLAN) 5 MG tablet Take 1 tablet (5 mg total) by mouth every 8 (eight) hours as needed for refractory nausea / vomiting.      metoprolol tartrate 37.5 MG TABS Take 1 tablet (37.5 mg total) by mouth 2 (two) times daily. 75 tablet 0    midodrine (PROAMATINE) 5 MG tablet Take 1 tablet (5 mg total) by mouth 3 (three) times daily with meals. (Patient taking differently: Take 30 mg by mouth 3 (three) times daily with meals.) 90 tablet 1    Multiple Vitamin (MULTIVITAMIN WITH MINERALS) TABS tablet Take 1 tablet by mouth daily.      Nebulizer MISC 1  each by Does not apply route as needed. 1 each 0    Nutritional Supplements (,FEEDING SUPPLEMENT, PROSOURCE PLUS) liquid Take 30 mLs by mouth 3 (three) times daily between meals.      ondansetron (ZOFRAN-ODT) 4 MG disintegrating tablet Take 4 mg by mouth every 8 (eight) hours as needed for refractory nausea / vomiting, vomiting or nausea.      pantoprazole (PROTONIX) 40 MG tablet Take 1 tablet (40 mg total) by mouth 2 (two) times daily.      polyethylene glycol (MIRALAX / GLYCOLAX) 17 g packet Take 17  g by mouth 2 (two) times daily. 14 each 0    pyridostigmine (MESTINON) 60 MG tablet Take 1 tablet (60 mg total) by mouth every 8 (eight) hours.      sertraline (ZOLOFT) 25 MG tablet Take 25 mg by mouth daily.      sucralfate (CARAFATE) 1 GM/10ML suspension Take 10 mLs (1 g total) by mouth 4 (four) times daily -  with meals and at bedtime. 420 mL 0    tamsulosin (FLOMAX) 0.4 MG CAPS capsule Take 0.4 mg by mouth daily.       ROS:                                                                                                                                       Unable to obtain due to confusion.    There were no vitals taken for this visit.   General Examination:                                                                                                       Physical Exam  General: Well-developed and well-nourished.  Ext: No edema    Neuro: Mental Status: Awake, alert and oriented to person, but not to location, time or situation. Patient is no longer able to give a clear and coherent history. Speech is often garbled, but at times comprehensible. All answers are with short phrases or one-word utterances. Findings are worsened relative to prior Neurological exam at [facility].  Cranial Nerves: II: Right hemianopia when testing blink to threat. Pupils are equal, round, and reactive to light.   III,IV, VI: EOMI without ptosis or diplopia.  V: Patient not providing reliable responses VII: Mild right facial weakness to observation while patient is speaking/interacting with staff VIII: Hearing is intact to voice X: Gag reflex deferred XI: Head is midline XII: No lingual dysarthria Motor: He gives poor effort in his right  upper extremity, and repeated efforts are needed to be made in order to get the patient to give good effort.  When he does give good effort he is able to hold against gravity with significant drift, though it does not quite hit the bed prior to 10 seconds. This is unchanged  relative to the exam at [facility]. He gives a little bit of effort against gravity in his right lower extremity, but rapidly falls to the bed. Full strength in LUE and LLE Sensory: Gives unreliable responses when asked regarding gross touch stimuli to extremities on the left and right, but seems to respond less briskly to right sided stimuli.  Cerebellar: No definite ataxia of upper extremities.    Lab Results: Basic Metabolic Panel: Recent Labs  Lab 12/27/22 1216 12/28/22 0409 12/29/22 0836  NA 138 134* 132*  K 4.2 4.1 3.7  CL 105 103 102  CO2 25 24 21*  GLUCOSE 108* 102* 97  BUN 19 29* 39*  CREATININE 1.54* 1.76* 2.12*  CALCIUM 9.4 8.4* 7.7*  MG 1.9  --   --     CBC: Recent Labs  Lab 12/27/22 1216 12/28/22 0409 12/29/22 0836  WBC 10.9* 20.6* 12.9*  HGB 9.7* 8.7* 8.4*  HCT 31.5* 26.8* 26.2*  MCV 94.9 91.8 92.6  PLT 178 141* 109*    Cardiac Enzymes: No results for input(s): "CKTOTAL", "CKMB", "CKMBINDEX", "TROPONINI" in the last 168 hours.  Lipid Panel: No results for input(s): "CHOL", "TRIG", "HDL", "CHOLHDL", "VLDL", "LDLCALC" in the last 168 hours.  Imaging: CT ANGIO HEAD NECK W WO CM W PERF (CODE STROKE)  Result Date: 12/29/2022 CLINICAL DATA:  82 year old male inpatient code stroke presentation. Right side weakness and aphasia. EXAM: CT ANGIOGRAPHY HEAD AND NECK WITH AND WITHOUT CONTRAST TECHNIQUE: Multidetector CT imaging of the head and neck was performed using the standard protocol during bolus administration of intravenous contrast. Multiplanar CT image reconstructions and MIPs were obtained to evaluate the vascular anatomy. Carotid stenosis measurements (when applicable) are obtained utilizing NASCET criteria, using the distal internal carotid diameter as the denominator. RADIATION DOSE REDUCTION: This exam was performed according to the departmental dose-optimization program which includes automated exposure control, adjustment of the mA and/or kV according to  patient size and/or use of iterative reconstruction technique. CONTRAST:  60mL OMNIPAQUE IOHEXOL 350 MG/ML SOLN COMPARISON:  Plain head CT 10/30/2022. FINDINGS: CT HEAD Brain: No midline shift, ventriculomegaly, mass effect, evidence of mass lesion, intracranial hemorrhage or evidence of cortically based acute infarction. Gray-white differentiation appears stable, symmetric, within normal limits for age. ASPECTS 10. Calvarium and skull base: No acute osseous abnormality identified. Paranasal sinuses: Visualized paranasal sinuses and mastoids are stable and well aerated. Orbits: No acute orbit or scalp soft tissue finding. CTA NECK Skeleton: Mild mandible motion artifact. Cervical spine degeneration although generally mild for age. No acute osseous abnormality identified. Upper chest: Left chest pacemaker. Small layering pleural effusions. Mild respiratory motion, mild paraseptal emphysema. Grossly patent visible central pulmonary arteries. Other neck: No acute neck soft tissue finding. Aortic arch: 3 vessel arch. Moderate Calcified aortic atherosclerosis. Right carotid system: Brachiocephalic artery and proximal right CCA are patent with mild plaque and no stenosis. Mild to moderate soft and calcified plaque at the right ICA origin and bulb with no stenosis. Left carotid system: Mild to moderate soft and calcified plaque at the left CCA origin without stenosis. Mild motion artifact. Occasional other left CCA plaque before the bifurcation without stenosis. Moderate calcified plaque at the left ICA origin  and bulb. No significant ICA origin stenosis, but beginning at the medial distal bulb there is adherent thrombus within the vessel over a segment of nearly 2 cm. The string like thrombus continues within the lumen to series 9, image 93. The vessel remains patent, but at the distal bulb high-grade stenosis is numerically estimated at 80 % with respect to the distal vessel, series 11, image 195. Superimposed calcified  plaque of the left ICA just below the skull base. Vertebral arteries: Proximal subclavian arteries and vertebral artery origins are patent with no significant stenosis despite plaque. The vertebral arteries are diminutive, fairly codominant, and remain patent to the skull base. CTA HEAD Posterior circulation: Distal vertebral arteries and vertebrobasilar junction are diminutive but patent. Left V4 is mildly dominant. Patent bilateral PICA/AICA. Patent basilar artery without stenosis. Patent basilar tip and SCA origins. Fetal type bilateral PCA origins. Tortuous posterior communicating arteries. Bilateral PCA branches remain patent. On the left there is mild to moderate P3 irregularity and stenosis on series 15, image 29. Anterior circulation: Both ICA siphons are patent and enhance symmetrically. Moderate left siphon calcified plaque. Only mild left siphon stenosis. Normal left posterior communicating artery origin. Similar moderate right siphon calcified plaque. Mild supraclinoid right siphon stenosis. Normal right posterior communicating artery origin. Patent carotid termini. Patent MCA and ACA origins. Anterior communicating artery is within normal limits. Bilateral ACA branches are within normal limits. Right MCA M1 segment and bifurcation are patent without stenosis. Left MCA M1 segment and bifurcation are patent without stenosis. No discrete MCA branch occlusion is identified. Venous sinuses: Patent. Anatomic variants: Diminutive vertebrobasilar system on the basis of fetal PCA origins. Review of the MIP images confirms the above findings Preliminary report of the above Study discussed by telephone with Dr. Ritta Slot on 12/29/2022 at 0930 hours. IMPRESSION: 1. CTA is negative for large vessel occlusion but Positive for bulky, elongated thrombus within the Left ICA beginning at the distal bulb. The vessel remains patent, but with high-grade estimated 80% stenosis. This was discussed by telephone with  Dr. Ritta Slot on 12/29/2022 at 0943 hours. 2. Stable and negative for age noncontrast CT appearance of the brain. 3. Atherosclerosis in the head and neck but generally only mild subsequent stenosis. Up to Moderate Left PCA P3 branch stenosis. 4. Aortic Atherosclerosis (ICD10-I70.0). Small layering pleural effusions. Electronically Signed   By: Odessa Fleming M.D.   On: 12/29/2022 09:52   ECHOCARDIOGRAM COMPLETE  Result Date: 12/29/2022    ECHOCARDIOGRAM REPORT   Patient Name:   Matthew Zamora Date of Exam: 12/29/2022 Medical Rec #:  [id number]           Height:       68.0 in Accession #:    [id number]          Weight:       143.7 lb Date of Birth:  1940/09/21           BSA:          1.776 m Patient Age:    81 years            BP:           77/49 mmHg Patient Gender: M                   HR:           133 bpm. Exam Location:  [facility] Procedure: 2D Echo Indications:     CHF I50.21  History:  Patient has prior history of Echocardiogram examinations, most                  recent 11/10/2022.  Sonographer:     Overton Mam RDCS, FASE Referring Phys:  [id number] Hubbard Hartshorn OUMA Diagnosing Phys: Chilton Si MD  Sonographer Comments: Technically challenging study due to limited acoustic windows and suboptimal parasternal window. Image acquisition challenging due to uncooperative patient. Unable to perform a complete echo at this time due to patient being extremely uncooperative. IMPRESSIONS  1. Global hypokinesis worse in the septum. Left ventricular ejection fraction, by estimation, is 30 to 35%. The left ventricle has moderately decreased function. The left ventricle demonstrates global hypokinesis. There is mild left ventricular hypertrophy of the septal segment. Left ventricular diastolic parameters are indeterminate.  2. Right ventricular systolic function is normal. The right ventricular size is normal.  3. The mitral valve is normal in structure. Mild mitral valve regurgitation. No evidence of  mitral stenosis.  4. The aortic valve is normal in structure. Aortic valve regurgitation is not visualized. No aortic stenosis is present.  5. The inferior vena cava is normal in size with greater than 50% respiratory variability, suggesting right atrial pressure of 3 mmHg. FINDINGS  Left Ventricle: Global hypokinesis worse in the septum. Left ventricular ejection fraction, by estimation, is 30 to 35%. The left ventricle has moderately decreased function. The left ventricle demonstrates global hypokinesis. The left ventricular internal cavity size was normal in size. There is mild left ventricular hypertrophy of the septal segment. Left ventricular diastolic parameters are indeterminate. Right Ventricle: The right ventricular size is normal. No increase in right ventricular wall thickness. Right ventricular systolic function is normal. Left Atrium: Left atrial size was normal in size. Right Atrium: Right atrial size was normal in size. Pericardium: There is no evidence of pericardial effusion. Mitral Valve: The mitral valve is normal in structure. Mild mitral valve regurgitation. No evidence of mitral valve stenosis. Tricuspid Valve: The tricuspid valve is normal in structure. Tricuspid valve regurgitation is trivial. No evidence of tricuspid stenosis. Aortic Valve: The aortic valve is normal in structure. Aortic valve regurgitation is not visualized. No aortic stenosis is present. Aortic valve peak gradient measures 7.2 mmHg. Pulmonic Valve: The pulmonic valve was normal in structure. Pulmonic valve regurgitation is not visualized. No evidence of pulmonic stenosis. Aorta: The aortic root is normal in size and structure. Venous: The inferior vena cava is normal in size with greater than 50% respiratory variability, suggesting right atrial pressure of 3 mmHg. IAS/Shunts: No atrial level shunt detected by color flow Doppler.  LEFT VENTRICLE PLAX 2D LVIDd:         5.20 cm     Diastology LVIDs:         4.40 cm     LV e'  medial:    8.38 cm/s LV PW:         1.00 cm     LV E/e' medial:  13.6 LV IVS:        1.20 cm     LV e' lateral:   13.90 cm/s LVOT diam:     1.80 cm     LV E/e' lateral: 8.2 LVOT Area:     2.54 cm  LV Volumes (MOD) LV vol d, MOD A4C: 74.3 ml LV vol s, MOD A4C: 48.7 ml LV SV MOD A4C:     74.3 ml LEFT ATRIUM         Index LA diam:    4.00  cm 2.25 cm/m  AORTIC VALVE              PULMONIC VALVE AV Vmax:      134.00 cm/s PV Vmax:       0.93 m/s AV Peak Grad: 7.2 mmHg    PV Peak grad:  3.5 mmHg  AORTA Ao Root diam: 3.60 cm MITRAL VALVE                TRICUSPID VALVE MV Area (PHT): 6.02 cm     TR Peak grad:   23.2 mmHg MV Decel Time: 126 msec     TR Vmax:        241.00 cm/s MV E velocity: 114.00 cm/s                             SHUNTS                             Systemic Diam: 1.80 cm Chilton Si MD Electronically signed by Chilton Si MD Signature Date/Time: 12/29/2022/9:43:31 AM    Final    CT Angio Chest PE W and/or Wo Contrast  Result Date: 12/27/2022 CLINICAL DATA:  Pulmonary embolus suspected with high probability. Shortness of breath. COPD. EXAM: CT ANGIOGRAPHY CHEST WITH CONTRAST TECHNIQUE: Multidetector CT imaging of the chest was performed using the standard protocol during bolus administration of intravenous contrast. Multiplanar CT image reconstructions and MIPs were obtained to evaluate the vascular anatomy. RADIATION DOSE REDUCTION: This exam was performed according to the departmental dose-optimization program which includes automated exposure control, adjustment of the mA and/or kV according to patient size and/or use of iterative reconstruction technique. CONTRAST:  75mL OMNIPAQUE IOHEXOL 350 MG/ML SOLN COMPARISON:  Chest radiograph 12/17/2022.  CT 11/10/2022. FINDINGS: Cardiovascular: Good opacification of the central and segmental pulmonary arteries resulting in technically adequate study. No focal filling defects. No evidence of significant pulmonary embolus. Normal heart size. No  pericardial effusions. Normal caliber thoracic aorta. Calcification of the aorta and coronary arteries. Mediastinum/Nodes: Thyroid gland is unremarkable. Esophagus is decompressed. No significant lymphadenopathy. Lungs/Pleura: Emphysematous changes and scattered fibrosis in the lungs. Bronchial wall thickening, subpleural fibrosis and ground-glass changes likely representing changes of respiratory bronchiolitis. No pleural effusions. No pneumothorax. Upper Abdomen: Cholelithiasis with small stone in the gallbladder. No inflammatory infiltration demonstrated. Mass in the inferior right lobe of the liver measuring 4.9 x 5.5 cm in diameter. This has been present on previous studies including CT abdomen and pelvis from 03/27/2022 and dating back to 05/16/2015. Lesion is slowly growing but long-term presence of the lesion suggest likely benign etiology. This could represent a biliary cystadenoma. The lesion is incompletely visualized and not well characterized on this study which was optimized for evaluation of pulmonary arteries. Musculoskeletal: No acute bony abnormalities. Old right rib fractures. Review of the MIP images confirms the above findings. IMPRESSION: 1. No evidence of significant pulmonary embolus. 2. 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 3. Cholelithiasis without inflammatory change. 4. Cystic lesion in the liver is incompletely visualized but present on prior studies. Likely benign. 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 5. Aortic atherosclerosis. Electronically Signed   By: Burman Nieves M.D.   On: 12/27/2022 17:48     Assessment: 82 year old male presenting to [facility] in transfer from [facility] with acute left ICA unstable non-occlusive thrombus with clinically significant stenosis requiring thrombectomy - Given that he has had fluctuating symptoms  overnight at [facility] despite anticoagulation, we do suspect that this is an unstable thrombus.  Based on this, as well as the large size,  after discussion with neuro IR and vascular surgery, the decision was made to proceed with endovascular mechanical thrombectomy. - Exam reveals receptive dysphasia, visual field cut, right facial droop and right upper and lower extremity weakness. NIHSS 12 - CTA of head and neck performed at [facility] at [phone]: CTA is negative for large vessel occlusion but Positive for bulky, elongated thrombus within the Left ICA beginning at the distal bulb. The vessel remains patent, but with high-grade estimated 80% stenosis. Atherosclerosis in the head and neck but generally only mild subsequent stenosis. Up to moderate left PCA P3 branch stenosis. Aortic atherosclerosis - CT head: Stable and negative for age noncontrast CT appearance of the brain. - Following VIR, the patient will be admitted to the ICU under the Critical Care service.  - With his positive blood cultures, there is a risk of septic emboli as well if he does have endocarditis, and therefore we would favor heparin as opposed to apixaban in this setting in case he were to have hemorrhagic conversion - Also, with him complaining of neck pain yesterday in the setting of bacteremia, there could be a risk of meningitis.  He is not safe to LP given his anticoagulation, and need to continue anticoagulation.  We would favor increasing the blood level goal for his vancomycin to cover him in case he has seeded his meninges as well.   Recommendations: - Being admitted to the Neuro ICU under CCM  - Post-VIR order set to include frequent neuro checks and BP management.  - No antiplatelet medications or anticoagulants for at least 24 hours following VIR, unless there is a clear indication, such as stent placement  - DVT prophylaxis with SCDs.  - Statin.  - Decision of when to restart Eliquis will be made by Stroke Team who will be rounding on the patient tomorrow. - Has had recent TTE (see above)  - Pacemaker/defibrillator battery is seen in the left infraclavicular  region on CXR. MRI brain can be obtained if the pacemaker is MRI-compatible   - PT/OT/Speech.  - NPO until passes swallow evaluation.  - Telemetry monitoring - Fasting lipid panel, HgbA1c   Electronically signed: Dr. Caryl Pina 12/29/2022, 1:23 PM

## 2022-12-29 NOTE — Procedures (Signed)
INR.  Status post left common carotid arteriogram.    Right CFA approach  Findings.  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  Status post complete revascularization of the  proximal left internal carotid artery with 1 pass with a 6 mm x 47 embotrap  retrieval device with contact aspiration, and proximal flow arrest revealing underlying severe stenosis at the distal aspect of the bulb.  Status post stent assisted angioplasty of proximal Lt ICA stenosis with proximal and distal protection.  Post CT brain no evidence of intracranial hemorrhage.  7 French exo seal closure device with manual  compression applied at  the right groin puncture site.  Distal pulses all present bilaterally in both feet.  Medications half-strength cangrelor bolus followed by 4-hour infusion.  Patient scheduled for CT of the brain at approximately 6:20 PM tonight after stopping the IV cangrelor.    Patient  extubated.   Slow in waking up. Not following commands yet.  S.Deveshwart MD

## 2022-12-29 NOTE — Consult Note (Signed)
Pharmacy Antibiotic Note  Matthew Zamora is a 82 y.o. male admitted on 12/27/2022 with shortness of breath and chest pain admitted for acute on chronic HF exacerbation and Afib with RVR.  Medical history significant for HFrEF, COPD intermittently on home O2, A-fib.   12/28/22: patient with new fever of  103.3, SBP in 70-80s. Pharmacy has been consulted for vancomycin and cefepime dosing for sepsis/MRSA bacteremia/possible meningitis  Plan:  1) continue cefepime 2 grams IV every 12 hours  2) s/p vancomycin 1500 mg LD x 1 07/14 1437 and then 750 mg IV every 24 hours  Vd 46.9, T1/2 ~ 27 hrs, Ke 0.026 Random vancomycin level prior to maintenance dose (level will not return until tomorrow) Will order BMP for tomorrow AM and assess appropriateness for continuing given renal function vs changing to alternative agent such as linezolid   Height: 5\' 8"  (172.7 cm) Weight: 65.2 kg (143 lb 11.8 oz) IBW/kg (Calculated) : 68.4  Temp (24hrs), Avg:99.2 F (37.3 C), Min:97.7 F (36.5 C), Max:103.3 F (39.6 C)  Recent Labs  Lab 12/27/22 1216 12/28/22 0409 12/28/22 1340 12/29/22 0836  WBC 10.9* 20.6*  --  12.9*  CREATININE 1.54* 1.76*  --  2.12*  LATICACIDVEN  --   --  1.6  --     Estimated Creatinine Clearance: 25.6 mL/min (A) (by C-G formula based on SCr of 2.12 mg/dL (H)).    Allergies  Allergen Reactions   Nitrofuran Derivatives Other (See Comments)    Transaminitis ** confounded w/Amiodarone, Mexiletine   Spironolactone     Significant transaminitis     Antimicrobials this admission: 7/13 cefepime >>  7/13 vancomycin >>   Microbiology results: 7/13 BCx: MRSA 7/13 MRSA PCR: positive  Thank you for allowing pharmacy to be a part of this patient's care.  Lowella Bandy, PharmD, BCPS Clinical Pharmacist   12/29/2022 9:22 AM

## 2022-12-29 NOTE — Progress Notes (Signed)
Patient being transported to [facility] for interventional procedure due to stroke symptoms. Report was given by phone to [facility], and also gave bedside handoff. Pt on norepinephrine for BP support via right forearm US guided  PIV as well as D5NS and Heparin infusion. Family at bedside and updated; all questions answered. Pt remained in afib with RVR rate in 140's. On O2 at 4LPM with sats 100% upon discharge.

## 2022-12-29 NOTE — Progress Notes (Signed)
Pt had another sustained round of vtach. Converted back to Afib. Vitals stable. Elink MD notified.   Cranford Mon

## 2022-12-29 NOTE — Progress Notes (Signed)
Pt had sustained Vtach for almost a minute. Had pulse, spoke to me when he converted back to Afib. Vitals stable. Elink MD notified.   Cranford Mon

## 2022-12-29 NOTE — Consult Note (Signed)
[place] [facility]   ASSESSMENT / PLAN: 82 y.o. 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 The patient is critically ill in septic shock of unclear origin with MRSA bacteremia. He has congestive heart failure; AF RVR; COPD. He is a poor operative candidate.   From a vascular standpoint, thromboendarterectomy could be performed technically. The major issue with this approach would be potential for distal disection or embolization of thrombus, necessitating NeuroIR assistance.   I discussed the case in detail with Dr. Amada Jupiter and Dr. Clide Dales. I think he will be best served by an endovascular approach from Neuro IR at [facility]. We will be available to assist as needed.   CHIEF COMPLAINT: stroke; septic shock  HISTORY OF PRESENT ILLNESS: Matthew Zamora is a 82 y.o. male admitted to the ICU for treatement of septic shock of unclear origin. He has MRSA bacteremia. He is chronically ill with multiple other medical problems including HFrEF, AF RVR, COPD, etc. He suffered a sudden deterioration early this morning with right sided weakness and aphasia. CT angiogram was performed which showed nearly occlusive ICA thrombus.   On my assessment, the patient is somnolent. All history is per chart review and discussion with staff.  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 pylori infection    History of hepatitis    Hyperlipidemia    Hypertension    Liver cyst 05/16/15   PAF (paroxysmal atrial fibrillation) ([name])    Prostate CA [facility])     Past Surgical History:  Procedure Laterality Date   CATARACT  EXTRACTION     COLONOSCOPY  10/06/2014, 09/18/2004, 03/14/2009   ESOPHAGOGASTRODUODENOSCOPY  10/06/2014, 03/18/2001, 03/14/2009   ESOPHAGOGASTRODUODENOSCOPY (EGD) WITH PROPOFOL N/A 05/07/2018   Procedure: ESOPHAGOGASTRODUODENOSCOPY (EGD) WITH PROPOFOL;  Surgeon: Toledo, Boykin Nearing, MD;  Location: [facility] ENDOSCOPY;  Service: Gastroenterology;  Laterality: N/A;   ESOPHAGOGASTRODUODENOSCOPY (EGD) WITH PROPOFOL N/A 04/22/2022   Procedure: ESOPHAGOGASTRODUODENOSCOPY (EGD) WITH PROPOFOL;  Surgeon: Toney Reil, MD;  Location: [facility] ENDOSCOPY;  Service: Endoscopy;  Laterality: N/A;   FLEXIBLE SIGMOIDOSCOPY  08/26/1990   HIP ARTHROPLASTY Right 10/31/2022   Procedure: ARTHROPLASTY BIPOLAR HIP (HEMIARTHROPLASTY)-RNFA;  Surgeon: Reinaldo Berber, MD;  Location: [facility] ORS;  Service: Orthopedics;  Laterality: Right;   INSERTION OF ICD     PROSTATE SURGERY     TONSILLECTOMY      Family History  Problem Relation Age of Onset   Heart attack Mother    Heart attack Father     Social History   Socioeconomic History   Marital status: Single    Spouse name: Not on file   Number of children: Not on file   Years of education: Not on file   Highest education level: Not on file  Occupational History   Not on file  Tobacco Use   Smoking status: Former    Current packs/day: 0.00    Average packs/day: 1 pack/day for 38.0 years (38.0 ttl pk-yrs)    Types: Cigarettes    Start date: 06/17/1961    Quit date: 06/18/1999    Years since quitting: 23.5   Smokeless tobacco:  Never  Vaping Use   Vaping status: Never Used  Substance and Sexual Activity   Alcohol use: No    Alcohol/week: 0.0 standard drinks of alcohol   Drug use: Not Currently   Sexual activity: Not Currently  Other Topics Concern   Not on file  Social History Narrative   Not on file   Social Determinants of Health   Financial Resource Strain: Low Risk  (12/05/2021)   [facility], [facility]   Overall Financial Resource  Strain (CARDIA)    Difficulty of Paying Living Expenses: Not hard at all  Food Insecurity: No Food Insecurity (12/17/2022)   Hunger Vital Sign    Worried About Running Out of Food in the Last Year: Never true    Ran Out of Food in the Last Year: Never true  Transportation Needs: No Transportation Needs (12/17/2022)   PRAPARE - Administrator, Civil Service (Medical): No    Lack of Transportation (Non-Medical): No  Physical Activity: Not on file  Stress: Not on file  Social Connections: Not on file  Intimate Partner Violence: Not At Risk (12/17/2022)   Humiliation, Afraid, Rape, and Kick questionnaire    Fear of Current or Ex-Partner: No    Emotionally Abused: No    Physically Abused: No    Sexually Abused: No    Allergies  Allergen Reactions   Nitrofuran Derivatives Other (See Comments)    Transaminitis ** confounded w/Amiodarone, Mexiletine   Spironolactone     Significant transaminitis     Current Facility-Administered Medications  Medication Dose Route Frequency Provider Last Rate Last Admin   (feeding supplement) PROSource Plus liquid 30 mL  30 mL Oral TID BM Venora Maples, MD   30 mL at 12/28/22 1733   0.9 %  sodium chloride infusion   Intravenous PRN Marcelino Duster, MD   Paused at 12/29/22 0343   0.9 %  sodium chloride infusion  250 mL Intravenous Continuous Jimmye Norman, NP       acetaminophen (TYLENOL) tablet 650 mg  650 mg Oral Q6H PRN Venora Maples, MD   650 mg at 12/28/22 1610   Or   acetaminophen (TYLENOL) suppository 650 mg  650 mg Rectal Q6H PRN Venora Maples, MD       albuterol (PROVENTIL) (2.5 MG/3ML) 0.083% nebulizer solution 2.5 mg  2.5 mg Inhalation PRN Venora Maples, MD       amiodarone (NEXTERONE PREMIX) 360-4.14 MG/200ML-% (1.8 mg/mL) IV infusion  60 mg/hr Intravenous Continuous Lowella Bandy, RPH       ascorbic acid (VITAMIN C) tablet 500 mg  500 mg Oral Daily Venora Maples, MD   500 mg at 12/28/22 1730    atorvastatin (LIPITOR) tablet 80 mg  80 mg Oral Daily Venora Maples, MD   80 mg at 12/28/22 1730   calcium carbonate (TUMS - dosed in mg elemental calcium) chewable tablet 750 mg  750 mg Oral Daily Venora Maples, MD   750 mg at 12/28/22 1742   ceFEPIme (MAXIPIME) 2 g in sodium chloride 0.9 % 100 mL IVPB  2 g Intravenous Q12H Sharen Hones, RPH   Stopped at 12/29/22 0324   Chlorhexidine Gluconate Cloth 2 % PADS 6 each  6 each Topical Daily Mansy, Jan A, MD   6 each at 12/28/22 1230   dextrose 5 %-0.9 % sodium chloride infusion   Intravenous Continuous Mansy, Jan A, MD 100 mL/hr at 12/29/22  1000 Infusion Verify at 12/29/22 1000   dextrose 50 % solution            fluticasone furoate-vilanterol (BREO ELLIPTA) 200-25 MCG/ACT 1 puff  1 puff Inhalation Daily Venora Maples, MD   1 puff at 12/28/22 1053   gabapentin (NEURONTIN) capsule 100 mg  100 mg Oral QHS Venora Maples, MD   100 mg at 12/28/22 2220   heparin ADULT infusion 100 units/mL (25000 units/217mL)  900 Units/hr Intravenous Continuous Lowella Bandy, RPH 9 mL/hr at 12/29/22 1047 900 Units/hr at 12/29/22 1047   HYDROmorphone (DILAUDID) injection 0.5-1 mg  0.5-1 mg Intravenous Q2H PRN Venora Maples, MD   1 mg at 12/28/22 0451   magnesium oxide (MAG-OX) tablet 400 mg  400 mg Oral Daily Venora Maples, MD   400 mg at 12/28/22 1403   melatonin tablet 10 mg  10 mg Oral QHS Venora Maples, MD   10 mg at 12/28/22 2227   midodrine (PROAMATINE) tablet 30 mg  30 mg Oral TID WC Venora Maples, MD   30 mg at 12/28/22 1729   morphine (PF) 2 MG/ML injection 1-2 mg  1-2 mg Intravenous Q4H PRN Mansy, Jan A, MD   2 mg at 12/29/22 0423   multivitamin with minerals tablet 1 tablet  1 tablet Oral Daily Venora Maples, MD       mupirocin ointment (BACTROBAN) 2 % 1 Application  1 Application Nasal BID Mansy, Jan A, MD   1 Application at 12/29/22 1126   norepinephrine (LEVOPHED) 4mg  in (0.016 mg/mL) premix infusion  2-10  mcg/min Intravenous Titrated Jimmye Norman, NP 7.5 mL/hr at 12/29/22 1000 2 mcg/min at 12/29/22 1000   ondansetron (ZOFRAN) tablet 4 mg  4 mg Oral Q6H PRN Venora Maples, MD       Or   ondansetron [facility]) injection 4 mg  4 mg Intravenous Q6H PRN Venora Maples, MD   4 mg at 12/29/22 0428   oxyCODONE (Oxy IR/ROXICODONE) immediate release tablet 5 mg  5 mg Oral Q4H PRN Venora Maples, MD   5 mg at 12/29/22 0352   pantoprazole (PROTONIX) EC tablet 40 mg  40 mg Oral BID Venora Maples, MD   40 mg at 12/28/22 2220   polyethylene glycol (MIRALAX / GLYCOLAX) packet 17 g  17 g Oral BID Venora Maples, MD   17 g at 12/28/22 2219   polyethylene glycol (MIRALAX / GLYCOLAX) packet 17 g  17 g Oral Daily PRN Venora Maples, MD       pyridostigmine (MESTINON) tablet 60 mg  60 mg Oral Q8H Venora Maples, MD   60 mg at 12/29/22 0559   sertraline (ZOLOFT) tablet 25 mg  25 mg Oral Daily Venora Maples, MD   25 mg at 12/28/22 0931   sodium chloride flush (NS) 0.9 % injection 3 mL  3 mL Intravenous Q12H Venora Maples, MD   3 mL at 12/29/22 0915   sucralfate (CARAFATE) 1 GM/10ML suspension 1 g  1 g Oral TID WC & HS Venora Maples, MD   1 g at 12/28/22 2227   tamsulosin (FLOMAX) capsule 0.4 mg  0.4 mg Oral Daily Venora Maples, MD   0.4 mg at 12/28/22 1438   traZODone (DESYREL) tablet 50 mg  50 mg Oral QHS PRN Venora Maples, MD       vancomycin (VANCOREADY) IVPB 750 mg/150 mL  750 mg  Intravenous Once Lowella Bandy, [facility]       vancomycin variable dose per unstable renal function (pharmacist dosing)   Does not apply See admin instructions Sharen Hones, [facility]        PHYSICAL EXAM Vitals:   12/29/22 1045 12/29/22 1100 12/29/22 1115 12/29/22 1130  BP: (!) 91/54 (!) 54/21 (!) 97/55 (!) 88/62  Pulse: (!) 140 (!) 138 (!) 125 (!) 135  Resp: 15 19 14 12   Temp:      TempSrc:      SpO2: 100% 100% 100% 100%  Weight:      Height:       Somnolent. Critically  ill. Not able to participate in exam. Irregularly irregular tachycardia Unlabored breathing Right sided weakness with spontaneous movement noted  PERTINENT LABORATORY AND RADIOLOGIC DATA  Most recent CBC    Latest Ref Rng & Units 12/29/2022    8:36 AM 12/28/2022    4:09 AM 12/27/2022   12:16 PM  CBC  WBC 4.0 - 10.5 K/uL 12.9  20.6  10.9   Hemoglobin 13.0 - 17.0 g/dL 8.4  8.7  9.7   Hematocrit 39.0 - 52.0 % 26.2  26.8  31.5   Platelets 150 - 400 K/uL 109  141  178      Most recent CMP    Latest Ref Rng & Units 12/29/2022    8:36 AM 12/28/2022    4:09 AM 12/27/2022   12:16 PM  CMP  Glucose 70 - 99 mg/dL 97  308  657   BUN 8 - 23 mg/dL 39  29  19   Creatinine 0.61 - 1.24 mg/dL 8.46  9.62  9.52   Sodium 135 - 145 mmol/L 132  134  138   Potassium 3.5 - 5.1 mmol/L 3.7  4.1  4.2   Chloride 98 - 111 mmol/L 102  103  105   CO2 22 - 32 mmol/L 21  24  25    Calcium 8.9 - 10.3 mg/dL 7.7  8.4  9.4   Total Protein 6.5 - 8.1 g/dL 5.1  5.7  6.2   Total Bilirubin 0.3 - 1.2 mg/dL 0.9  0.9  0.6   Alkaline Phos 38 - 126 U/L 53  66  81   AST 15 - 41 U/L 28  22  25    ALT 0 - 44 U/L 20  21  25      Renal function Estimated Creatinine Clearance: 25.6 mL/min (A) (by C-G formula based on SCr of 2.12 mg/dL (H)).  Hgb A1c MFr Bld (%)  Date Value  06/12/2021 5.1    LDL Cholesterol  Date Value Ref Range Status  06/14/2021 71 0 - 99 mg/dL Final    Comment:           Total Cholesterol/HDL:CHD Risk Coronary Heart Disease Risk Table                     Men   Women  1/2 Average Risk   3.4   3.3  Average Risk       5.0   4.4  2 X Average Risk   9.6   7.1  3 X Average Risk  23.4   11.0        Use the calculated Patient Ratio above and the CHD Risk Table to determine the patient's CHD Risk.        ATP III CLASSIFICATION (LDL):  <100     mg/dL   Optimal  841-324  mg/dL   Near or Above                    Optimal  130-159  mg/dL   Borderline  098-119  mg/dL   High  >147     mg/dL   Very  High Performed at [facility], [address] [place], [place] [address]     CT angiogram personally reviewed in detail.  There is partially occlusive thrombus in the ICA extending from the proximal vessel to the mid vessel in the neck. The clot tapers to a point. I do not see any downstream occlusion. The plaque burden in the left neck is not severe.   Rande Brunt. Lenell Antu, MD FACS [facility] Phone Number: [phone] 12/29/2022 11:40 AM   Total time spent on preparing this encounter including chart review, data review, collecting history, examining the patient, coordinating care for this new patient, 60 minutes.  Portions of this report may have been transcribed using voice recognition software.  Every effort has been made to ensure accuracy; however, inadvertent computerized transcription errors may still be present.

## 2022-12-29 NOTE — Consult Note (Signed)
Palliative Medicine Inpatient Consult Note  Consulting Provider: Dr. Katrinka Blazing  Reason for consult:   Palliative Care Consult Services Palliative Medicine Consult  Reason for Consult? FTT, MRSA bacteremia, recurrent admissions for multiple end stage illnesses   12/29/2022  HPI:  Per intake H&P --> 82 year old man with myriad of comorobidities including refractory orthostatic hypotension, HFrEF, chronic N/V who presented w/ SOB at [facility] acteremic with MRSA as well has + for rhinovirus. (+( R sided weakness w/ L ICA thrombus transferred to [facility] for emergent thrombectomy.   Palliative care has been asked to get involved in the setting of multiple readmissions, high chronic disease burden, and declined health to further discuss goals of care.   Clinical Assessment/Goals of Care:  *Please note that this is a verbal dictation therefore any spelling or grammatical errors are due to the "Dragon Medical One" system interpretation.  I have reviewed medical records including EPIC notes, labs and imaging, received report from bedside RN, assessed the patient who is lying in bed disoriented per RN has just been placed on precedex gtt.    I met with patients sons Terri Piedra, [place] [name], and ex wife Florentina Addison to further discuss diagnosis prognosis, GOC, EOL wishes, disposition and options.   I introduced Palliative Medicine as specialized medical care for people living with serious illness. It focuses on providing relief from the symptoms and stress of a serious illness. The goal is to improve quality of life for both the patient and the family.  Medical History Review and Understanding:  A review of Jaysen's medical history was held inclusive of HFrEF, COPD intermittently on home O2, A-fib, CKD, hypertension, anemia, & CAD.  Social History:  Philopateer is from [place], [place]. He is not married. He has three sons. He formerly worked as a Psychologist, occupational. He enjoys sitting on his recliner and watching  television. He is a man of faith.   Functional and Nutritional State:  Preceding admission Benett was living alone. He has a son, Jilda Panda who is able to check in regularly as he lives next door. His son [name] comes by twice daily during the weekend and his DIL [name] comes by twice daily during the week. He is able to mobilize with a rollator. He is able to dress himself. He baths with a shower chair. He relies on family for food and pill preparation. He does have a robust appetite.   Advance Directives:  A detailed discussion was had today regarding advanced directives.  Patient does not have AD's at this time and relies on his children to make decisions for him.   Code Status:  Concepts specific to code status, artifical feeding and hydration, continued IV antibiotics and rehospitalization was had.  The difference between a aggressive medical intervention path  and a palliative comfort care path for this patient at this time was had.   Encouraged patient/family to consider DNR/DNI status understanding evidenced based poor outcomes in similar hospitalized patient, as the cause of arrest is likely associated with advanced chronic/terminal illness rather than an easily reversible acute cardio-pulmonary event. I explained that DNR/DNI does not change the medical plan and it only comes into effect after a person has arrested (died).  It is a protective measure to keep [place] from harming the patient in their last moments of life.   Patients family share that Vipul was intubated once before for 15 days and did well. They believe he would want any and all interventions.   Discussion:  A  review of the events leading to hospitalization was held - family all agreed that he needed to transition to [facility] for this procedure. Patients family attests that Ousmane has never had a "stroke" and they are not sure what to expect moving forward. We reviewed the possible outcomes and possible scenarios that they may be faced  with in the setting of impaired mobility, cognition, & swallow.   We discussed patients recurrent readmissions to the hospital. Patient family shares that the recent hospitalizations were due to his recent hip fracture and fluctuations in his HF/rhythm and BP's. They share that he see's a cardiologies at [facility] regularly who helps with medications modification.  Regarding cognition patients family feel that Traven may have some memory disruptions but they do not find it to be serious enough to inhibit day to day activity. They share that at one point he was hallucinating there were bugs on the ceiling which was not distressing to him per family though he did try to "kill them". He does not have a shuffling gait per his family.    We discussed further best case versus worst case scenarios. Discussed chronic disease trajectory in the setting of acute events. Patients family would like to allow time to see how Jiyan is able to recover from this recent incident. Family's goals are for him to regain functionality.   Discussed the importance of continued conversation with family and their  medical providers regarding overall plan of care and treatment options, ensuring decisions are within the context of the patients values and GOCs.  Decision Maker: Oleski,[name] (Son): [phone] (Mobile)   SUMMARY OF RECOMMENDATIONS   Full Code / Full Scope of Care --> MOST form and Hard Choices booklet provided  Open and honest conversations held in the setting of patients  acute on chronic disease burden  Patients family would like to allow time for outcomes  Delirium precautions  Ongoing PMT support  Code Status/Advance Care Planning: FULL CODE  Palliative Prophylaxis:  Aspiration, Bowel Regimen, Delirium Protocol, Frequent Pain Assessment, Oral Care, Palliative Wound Care, and Turn Reposition  Additional Recommendations (Limitations, Scope, Preferences): Continue current  care  Psycho-social/Spiritual:  Desire for further Chaplaincy support: Not presently Additional Recommendations: Education on chronic disease trajectory   Prognosis: Unclear multiple readmissions and high disease burden places him at an elevated 12 month mortality risk  Discharge Planning: Discharge plan is not certain at this time  Vitals:   12/29/22 1645 12/29/22 1700  BP: 136/81 99/77  Pulse: (!) 137 (!) 136  Resp: (!) 27 (!) 23  Temp: (!) 97.5 F (36.4 C)   SpO2: 96% 97%    Intake/Output Summary (Last 24 hours) at 12/29/2022 1740 Last data filed at 12/29/2022 1700 Gross per 24 hour  Intake 1606.22 ml  Output 370 ml  Net 1236.22 ml   Last Weight  Most recent update: 12/29/2022  5:03 PM    Weight  67.2 kg (148 lb 2.4 oz)            Gen:  Elderly Caucasian M chronically ill appearing HEENT: moist mucous membranes CV: Irregular rate and rhythm  PULM:  On 2LPM [place], breathing is even and nonlabored ABD: soft/nontender  EXT: No edema  Neuro: Disoriented  PPS: 40%   This conversation/these recommendations were discussed with patient primary care team, Dr. Katrinka Blazing  Billing based on MDM: High ______________________________________________________ Lamarr Lulas [facility] Palliative Medicine Team Team Cell Phone: [phone] Please utilize secure chat with additional questions, if there is no response  within 30 minutes please call the above phone number  Palliative Medicine Team providers are available by phone from 7am to 7pm daily and can be reached through the team cell phone.  Should this patient require assistance outside of these hours, please call the patient's attending physician.

## 2022-12-29 NOTE — Consult Note (Addendum)
Neurology Consultation Reason for Consult: Right sided weakness Referring Physician: Alleen Borne  CC: Right-sided weakness  History is obtained from: Patient  HPI: Matthew Zamora is a 82 y.o. male with a history of hypertension, hyperlipidemia who is currently hospitalized with sepsis and has been found to have MRSA bacteremia.  Overnight, a code stroke was initially activated, and then canceled due to resolution of symptoms.  On initial assessment by the nurse this morning, he seemed confused, but was not until she noticed right-sided weakness that occurred stroke was activated.   Also of note, he was complaining of severe neck pain yesterday.   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 pylori infection    History of hepatitis    Hyperlipidemia    Hypertension    Liver cyst 05/16/15   PAF (paroxysmal atrial fibrillation) ([name])    Prostate CA ([name])      Family History  Problem Relation Age of Onset   Heart attack Mother    Heart attack Father      Social History:  reports that he quit smoking about 23 years ago. His smoking use included cigarettes. He started smoking about 61 years ago. He has a 38 pack-year smoking history. He has never used smokeless tobacco. He reports that he does not currently use drugs. He reports that he does not drink alcohol.   Exam: Current vital signs: BP 99/64   Pulse (!) 137   Temp 98.4 F (36.9 C) (Oral)   Resp 20   Ht 5\' 8"  (1.727 m)   Wt 65.2 kg   SpO2 100%    BMI 21.86 kg/m  Vital signs in last 24 hours: Temp:  [97.7 F (36.5 C)-98.8 F (37.1 C)] 98.4 F (36.9 C) (07/14 0400) Pulse Rate:  [100-139] 137 (07/14 0900) Resp:  [9-29] 20 (07/14 0900) BP: (64-196)/(41-182) 99/64 (07/14 0900) SpO2:  [94 %-100 %] 100 % (07/14 0900)   Physical Exam  Appears well-developed and well-nourished.  He does resist any type of neck manipulation, though not definite meningismus, he was resisting other types of manipulation as well.   Neuro: Mental Status: Patient is awake, alert, he has fluent episodes of speech, but many responses are inaccurate or nonsensical.  Cranial Nerves: II: R hemianopia. Pupils are equal, round, and reactive to light.   III,IV, VI: EOMI without ptosis or diploplia.  V: Facial sensation is symmetric to temperature VII: Facial movement with mild right facial weakness VIII: hearing is intact to voice X: Uvula elevates symmetrically XI: Shoulder shrug is symmetric. XII: tongue is midline without atrophy or fasciculations.  Motor: He gives poor effort in his right upper extremity, and repeated efforts are needed to be made in order to get the patient to give good effort.  When he does give good effort he is able to hold against gravity with significant drift, though it does not quite hit the bed prior to 10 seconds.  He gives a little bit of effort against gravity in his  right lower extremity, but rapidly falls to the bed,  3/5. Sensory: Sensation is symmetric to light touch and temperature in the arms and legs. Cerebellar: No definite ataxia.    I have reviewed labs in epic and the results pertinent to this consultation are: Cr 2.12  I have reviewed the images obtained:CT/CTA -no findings on CT, he does have significant stenosis from a nonocclusive thrombus in the left ICA.  Impression: 82 year old male with left ICA thrombus in the setting of MRSA bacteremia and sepsis.  Given that he has had fluctuating symptoms overnight  despite anticoagulation, I do suspect that this is an unstable thrombus.  Based on this, as well as the large size, after discussion with neuro IR and vascular surgery, the decision was made to proceed with endovascular mechanical thrombectomy and he is being transported to [facility] for this procedure.  With his positive blood cultures, there is a risk of septic emboli as well if he does have endocarditis, and therefore I would favor heparin as opposed to apixaban in this setting in case he were to have hemorrhagic conversion  Also, with him complaining of neck pain yesterday in the setting of bacteremia, there could be a risk of meningitis.  He is not safe to LP given his anticoagulation, and need to continue anticoagulation.  I would favor increasing the blood level goal for his vancomycin to cover him in case he has seeded his meninges as well.  Recommendations: 1) Levophed to avoid hypotension 2) transfer to [facility] for mechanical thrombectomy 3) change apixaban to heparin 4) I would favor using the blood level goal for meningitis for his vancomycin dosing given the neck complaint yesterday 5) stroke team to follow at [facility]  This patient is critically ill and at significant risk of neurological worsening, death and care requires constant monitoring of vital signs, hemodynamics,respiratory and cardiac monitoring, neurological assessment, discussion with family, other specialists and medical decision making of high complexity. I spent 65 minutes of neurocritical care time  in the care of  this patient. This was time spent independent of any time provided by nurse practitioner or PA.  Ritta Slot, MD [facility] [phone]  If 7pm- 7am, please page neurology on call as listed in [facility]. 12/29/2022  12:07 PM

## 2022-12-30 ENCOUNTER — Encounter (HOSPITAL_COMMUNITY): Payer: Self-pay | Admitting: Radiology

## 2022-12-30 ENCOUNTER — Inpatient Hospital Stay (HOSPITAL_COMMUNITY): Payer: Medicare Other

## 2022-12-30 DIAGNOSIS — I4891 Unspecified atrial fibrillation: Secondary | ICD-10-CM | POA: Diagnosis not present

## 2022-12-30 DIAGNOSIS — R7881 Bacteremia: Secondary | ICD-10-CM | POA: Diagnosis not present

## 2022-12-30 DIAGNOSIS — B9562 Methicillin resistant Staphylococcus aureus infection as the cause of diseases classified elsewhere: Secondary | ICD-10-CM | POA: Diagnosis not present

## 2022-12-30 DIAGNOSIS — Z515 Encounter for palliative care: Secondary | ICD-10-CM | POA: Diagnosis not present

## 2022-12-30 DIAGNOSIS — B348 Other viral infections of unspecified site: Secondary | ICD-10-CM

## 2022-12-30 LAB — CBC
HCT: 22.7 % — ABNORMAL LOW (ref 39.0–52.0)
Hemoglobin: 7.4 g/dL — ABNORMAL LOW (ref 13.0–17.0)
MCH: 30.1 pg (ref 26.0–34.0)
MCHC: 32.6 g/dL (ref 30.0–36.0)
MCV: 92.3 fL (ref 80.0–100.0)
Platelets: 89 10*3/uL — ABNORMAL LOW (ref 150–400)
RBC: 2.46 MIL/uL — ABNORMAL LOW (ref 4.22–5.81)
RDW: 15 % (ref 11.5–15.5)
WBC: 9 10*3/uL (ref 4.0–10.5)
nRBC: 0 % (ref 0.0–0.2)

## 2022-12-30 LAB — CULTURE, BLOOD (ROUTINE X 2): Special Requests: ADEQUATE

## 2022-12-30 LAB — DIC (DISSEMINATED INTRAVASCULAR COAGULATION)PANEL
D-Dimer, Quant: 0.72 ug/mL-FEU — ABNORMAL HIGH (ref 0.00–0.50)
Fibrinogen: 535 mg/dL — ABNORMAL HIGH (ref 210–475)
INR: 1.4 — ABNORMAL HIGH (ref 0.8–1.2)
Platelets: 92 10*3/uL — ABNORMAL LOW (ref 150–400)
Prothrombin Time: 17.4 seconds — ABNORMAL HIGH (ref 11.4–15.2)
Smear Review: NONE SEEN
aPTT: 50 seconds — ABNORMAL HIGH (ref 24–36)

## 2022-12-30 LAB — POCT I-STAT 7, (LYTES, BLD GAS, ICA,H+H)
Acid-base deficit: 6 mmol/L — ABNORMAL HIGH (ref 0.0–2.0)
Bicarbonate: 18.8 mmol/L — ABNORMAL LOW (ref 20.0–28.0)
Calcium, Ion: 1.2 mmol/L (ref 1.15–1.40)
HCT: 38 % — ABNORMAL LOW (ref 39.0–52.0)
Hemoglobin: 12.9 g/dL — ABNORMAL LOW (ref 13.0–17.0)
O2 Saturation: 99 %
Patient temperature: 99.1
Potassium: 3.5 mmol/L (ref 3.5–5.1)
Sodium: 136 mmol/L (ref 135–145)
TCO2: 20 mmol/L — ABNORMAL LOW (ref 22–32)
pCO2 arterial: 35.1 mmHg (ref 32–48)
pH, Arterial: 7.339 — ABNORMAL LOW (ref 7.35–7.45)
pO2, Arterial: 133 mmHg — ABNORMAL HIGH (ref 83–108)

## 2022-12-30 LAB — LIPID PANEL
Cholesterol: 103 mg/dL (ref 0–200)
HDL: 23 mg/dL — ABNORMAL LOW (ref >40)
LDL Cholesterol: 57 mg/dL (ref 0–99)
Total CHOL/HDL Ratio: 4.5 RATIO
Triglycerides: 116 mg/dL (ref <150)
VLDL: 23 mg/dL (ref 0–40)

## 2022-12-30 LAB — BASIC METABOLIC PANEL
Anion gap: 8 (ref 5–15)
BUN: 31 mg/dL — ABNORMAL HIGH (ref 8–23)
CO2: 18 mmol/L — ABNORMAL LOW (ref 22–32)
Calcium: 7.9 mg/dL — ABNORMAL LOW (ref 8.9–10.3)
Chloride: 106 mmol/L (ref 98–111)
Creatinine, Ser: 1.73 mg/dL — ABNORMAL HIGH (ref 0.61–1.24)
GFR, Estimated: 39 mL/min — ABNORMAL LOW (ref >60)
Glucose, Bld: 121 mg/dL — ABNORMAL HIGH (ref 70–99)
Potassium: 3.8 mmol/L (ref 3.5–5.1)
Sodium: 132 mmol/L — ABNORMAL LOW (ref 135–145)

## 2022-12-30 LAB — GLUCOSE, CAPILLARY
Glucose-Capillary: 98 mg/dL (ref 70–99)
Glucose-Capillary: 117 mg/dL — ABNORMAL HIGH (ref 70–99)
Glucose-Capillary: 89 mg/dL (ref 70–99)
Glucose-Capillary: 106 mg/dL — ABNORMAL HIGH (ref 70–99)
Glucose-Capillary: 114 mg/dL — ABNORMAL HIGH (ref 70–99)
Glucose-Capillary: 119 mg/dL — ABNORMAL HIGH (ref 70–99)
Glucose-Capillary: 114 mg/dL — ABNORMAL HIGH (ref 70–99)

## 2022-12-30 LAB — TROPONIN I (HIGH SENSITIVITY): Troponin I (High Sensitivity): 38 ng/L — ABNORMAL HIGH (ref <18)

## 2022-12-30 LAB — PHOSPHORUS: Phosphorus: 2.9 mg/dL (ref 2.5–4.6)

## 2022-12-30 LAB — MAGNESIUM: Magnesium: 2.1 mg/dL (ref 1.7–2.4)

## 2022-12-30 MED ORDER — POTASSIUM CHLORIDE CRYS ER 20 MEQ PO TBCR
20.0000 meq | EXTENDED_RELEASE_TABLET | Freq: Once | ORAL | Status: AC
  Administered 2022-12-30: 20 meq via ORAL
  Filled 2022-12-30: qty 1

## 2022-12-30 MED ORDER — ORAL CARE MOUTH RINSE: 15.0000 mL | OROMUCOSAL | Status: DC | PRN

## 2022-12-30 MED ORDER — VANCOMYCIN HCL 750 MG/150ML IV SOLN
750.0000 mg | INTRAVENOUS | Status: DC
  Filled 2022-12-30: qty 150

## 2022-12-30 MED ORDER — CHLORHEXIDINE GLUCONATE CLOTH 2 % EX PADS
6.0000 | MEDICATED_PAD | Freq: Every day | CUTANEOUS | Status: DC
  Administered 2022-12-30: 6 via TOPICAL

## 2022-12-30 MED ORDER — DICLOFENAC SODIUM 1 % EX GEL
4.0000 g | Freq: Four times a day (QID) | CUTANEOUS | Status: DC
  Administered 2022-12-30 – 2023-01-15 (×57): 4 g via TOPICAL
  Filled 2022-12-30: qty 100

## 2022-12-30 MED ORDER — HEPARIN SODIUM (PORCINE) 5000 UNIT/ML IJ SOLN: 5000.0000 [IU] | Freq: Three times a day (TID) | INTRAMUSCULAR | Status: DC

## 2022-12-30 MED ORDER — VANCOMYCIN HCL IN DEXTROSE 1-5 GM/200ML-% IV SOLN
1000.0000 mg | INTRAVENOUS | Status: DC
  Administered 2022-12-30 – 2023-01-06 (×8): 1000 mg via INTRAVENOUS
  Filled 2022-12-30 (×8): qty 200

## 2022-12-30 NOTE — Evaluation (Signed)
Physical Therapy Evaluation Patient Details Name: Matthew Zamora MRN: [id number] DOB: 08-Jan-1941 Today's Date: 12/30/2022  History of Present Illness  82 yo male admitted to [facility] 7/12 with SOB with decompensated HF. 7/13 hypotension with sepsis. 7/14 Rt weakness with code stroke and transfer to [facility], s/p Lt ICA thrombectomy with residual stenosis ultimately requiring left ICA stent placement.  Extubated post procedure but very confused. PMhx: COPD, CHF, AFib, CKD, HTN  Clinical Impression  Pt with eyes closed on arrival, easily awoken and able to state name, not oriented to day/year. PT agitated, screaming and cursing throughout making obtaining accurate PLOF difficult and pt not very agreeable to cues/ commands. Decreased initiation and awareness with pt internally distracted by left shoulder pain. Pt able to move RUE and RLE but could not formally assess at this time. Pt lives alone and reports he does not have 24hr assist. Pt with decreased cognition, mobility, strength, transfers and function who will benefit from acute therapy to maximize mobility, safety and independence.   Pt on 2L with desaturation to 88% on RA and returned to 2L HR 100        Assistance Recommended at Discharge Frequent or constant Supervision/Assistance  If plan is discharge home, recommend the following:  Can travel by private vehicle  A lot of help with walking and/or transfers;A lot of help with bathing/dressing/bathroom;Assistance with cooking/housework;Direct supervision/assist for medications management;Assist for transportation   Yes    Equipment Recommendations None recommended by PT  Recommendations for Other Services       Functional Status Assessment Patient has had a recent decline in their functional status and demonstrates the ability to make significant improvements in function in a reasonable and predictable amount of time.     Precautions / Restrictions Precautions Precautions:  Fall;Other (comment) Precaution Comments: art line Restrictions Weight Bearing Restrictions: No      Mobility  Bed Mobility Overal bed mobility: Needs Assistance Bed Mobility: Supine to Sit     Supine to sit: Min assist     General bed mobility comments: min assist to initiate moving to left as pt lying in bed stating he can't but only limited assist move legs and elevate trunk. PT internally distracted by left shoulder pain    Transfers Overall transfer level: Needs assistance   Transfers: Sit to/from Stand, Bed to chair/wheelchair/BSC Sit to Stand: Min assist Stand pivot transfers: Min assist         General transfer comment: min assist to stand and pivot to chair, increased time to rise and pivot with RUE support with cues for sequence and safety, again limited by left shoulder pain    Ambulation/Gait               General Gait Details: did not attempt due to pain and agitation as well as pt refusal  Stairs            Wheelchair Mobility     Tilt Bed    Modified Rankin (Stroke Patients Only) Modified Rankin (Stroke Patients Only) Pre-Morbid Rankin Score: Moderate disability Modified Rankin: Moderately severe disability     Balance Overall balance assessment: Needs assistance Sitting-balance support: No upper extremity supported, Feet supported Sitting balance-Leahy Scale: Fair Sitting balance - Comments: EOB without support   Standing balance support: Single extremity supported Standing balance-Leahy Scale: Poor Standing balance comment: RUe support in standing, flexed trunk  Pertinent Vitals/Pain Pain Assessment Pain Assessment: 0-10 Pain Score: 8  Pain Location: left upper trap Pain Descriptors / Indicators: Constant Pain Intervention(s): Heat applied, Limited activity within patient's tolerance, Repositioned, Monitored during session, Relaxation    Home Living Family/patient expects to be  discharged to:: Private residence Living Arrangements: Alone Available Help at Discharge: Family;Available PRN/intermittently Type of Home: Apartment Home Access: Level entry       Home Layout: One level Home Equipment: Rollator (4 wheels);Rolling Walker (2 wheels);Cane - single point;Wheelchair - manual;Toilet riser Additional Comments: PT providing conflicting information from chart review and hx provided last week. DIL manages medications, provides transportation and check on patient 2x/day    Prior Function Prior Level of Function : Needs assist;History of Falls (last six months)             Mobility Comments: intermittent rW use with falls ADLs Comments: per chart DIL assists with med management, meals; transportation; reports indep taking sink baths     Hand Dominance        Extremity/Trunk Assessment   Upper Extremity Assessment Upper Extremity Assessment: RUE deficits/detail;LUE deficits/detail RUE Deficits / Details: pt able to lift shoulder to grossly 70 degrees would not bring higher and agitated with attempts at assessment LUE Deficits / Details: pt reporting upper trapezius pain and refused significant movement due to pain    Lower Extremity Assessment Lower Extremity Assessment: RLE deficits/detail;LLE deficits/detail RLE Deficits / Details: at least 3/5 strength for quads and hamstring, pt with difficulty with hip flexion grossly 2/5, pt not agreeable to formal testing LLE Deficits / Details: grossly 3/5    Cervical / Trunk Assessment Cervical / Trunk Assessment: Other exceptions Cervical / Trunk Exceptions: forward head  Communication   Communication: HOH  Cognition Arousal/Alertness: Awake/alert Behavior During Therapy: Agitated Overall Cognitive Status: Impaired/Different from baseline Area of Impairment: Attention, Following commands, Safety/judgement, Awareness, Problem solving                   Current Attention Level: Focused    Following Commands: Follows one step commands inconsistently, Follows one step commands with increased time Safety/Judgement: Decreased awareness of deficits Awareness: Intellectual Problem Solving: Slow processing General Comments: pt agitated throughout session, screaming and cursing that left shoulder (upper trap) is painful even when static, pt resistant to massage but approved of heat applied. Increased time for all cues and transfers, not oriented to day or year        General Comments      Exercises     Assessment/Plan    PT Assessment Patient needs continued PT services  PT Problem List Decreased strength;Decreased range of motion;Decreased activity tolerance;Decreased balance;Decreased mobility;Decreased knowledge of use of DME;Decreased safety awareness;Decreased knowledge of precautions       PT Treatment Interventions DME instruction;Gait training;Stair training;Functional mobility training;Therapeutic activities;Therapeutic exercise;Balance training;Patient/family education    PT Goals (Current goals can be found in the Care Plan section)  Acute Rehab PT Goals Patient Stated Goal: to return home PT Goal Formulation: With patient Time For Goal Achievement: 01/13/23 Potential to Achieve Goals: Fair    Frequency Min 3X/week     Co-evaluation               AM-PAC PT "6 Clicks" Mobility  Outcome Measure Help needed turning from your back to your side while in a flat bed without using bedrails?: A Little Help needed moving from lying on your back to sitting on the side of a flat bed without using bedrails?:  A Little Help needed moving to and from a bed to a chair (including a wheelchair)?: A Little Help needed standing up from a chair using your arms (e.g., wheelchair or bedside chair)?: A Little Help needed to walk in hospital room?: A Lot Help needed climbing 3-5 steps with a railing? : Total 6 Click Score: 15    End of Session Equipment Utilized During  Treatment: Gait belt Activity Tolerance: Patient limited by pain;Treatment limited secondary to agitation Patient left: in chair;with call bell/phone within reach;with chair alarm set Nurse Communication: Mobility status PT Visit Diagnosis: Muscle weakness (generalized) (M62.81);Difficulty in walking, not elsewhere classified (R26.2)    Time: 8295-6213 PT Time Calculation (min) (ACUTE ONLY): 27 min   Charges:   PT Evaluation $PT Eval Moderate Complexity: 1 Mod PT Treatments $Therapeutic Activity: 8-22 mins PT General Charges $$ ACUTE PT VISIT: 1 Visit         Merryl Hacker, PT Acute Rehabilitation Services Office: [phone]   Woodard Perrell B Hendel Gatliff 12/30/2022, 10:10 AM

## 2022-12-30 NOTE — Progress Notes (Signed)
Pharmacy Antibiotic Note  Matthew MCCLEERY is a 82 y.o. male admitted on 12/29/2022 with bacteremia.  He was initially started on vancomycin and cefepime at [facility]. Pharmacy has been consulted to re-initiate vancomycin for MRSA bacteremia.   Renal function improving, afebrile, WBC normalized.  Plan: Schedule vanc 750mg  IV Q24H for AUC 465 using SCr 1.73 Monitor renal fxn, repeat BCx, vanc levels as indicated   Weight: 70.5 kg (155 lb 6.8 oz)  Temp (24hrs), Avg:98.4 F (36.9 C), Min:97.2 F (36.2 C), Max:100.3 F (37.9 C)  Recent Labs  Lab 12/27/22 1216 12/28/22 0409 12/28/22 1340 12/29/22 0836 12/29/22 1824 12/29/22 2233 12/30/22 0322  WBC 10.9* 20.6*  --  12.9*  --   --  9.0  CREATININE 1.54* 1.76*  --  2.12*  --  1.86* 1.73*  LATICACIDVEN  --   --  1.6  --   --   --   --   VANCORANDOM  --   --   --   --  10  --   --     Estimated Creatinine Clearance: 32.9 mL/min (A) (by C-G formula based on SCr of 1.73 mg/dL (H)).    Allergies  Allergen Reactions   Nitrofuran Derivatives Other (See Comments)    Transaminitis ** confounded w/Amiodarone, Mexiletine   Spironolactone     Significant transaminitis     Vanc 7/13, 7/14 >> Cefepime x1 7/13  7/13 Vanc 1500mg  at 1437 (SCr 1.76) 7/14 VR = 10 mcg/mL (30 hrs from loading dose) >> 1250mg  at 2100 (SCr 2.12)  7/13 BCx - MRSA  7/14 MRSA PCR - positive  Jahsiah Carpenter D. Laney Potash, PharmD, BCPS, BCCCP 12/30/2022, 7:24 AM

## 2022-12-30 NOTE — Consult Note (Signed)
Cardiology Consultation   Patient ID: Matthew Zamora MRN: [id number]; DOB: June 11, 1941  Admit date: 12/29/2022 Date of Consult: 12/30/2022  PCP:  Jerl Mina, MD   [facility] Providers Cardiologist:  [facility], Dr. Dayle Points   [name] is a 82 y.o. male with a hx of O2 dep COPD, HTN, HLD, CAD (PCI > RCA 2019), ICM, ICD, VT, AFib who is being seen 12/30/2022 for the evaluation of bacteremia in setting of ICD at the request of Dr. Wynona Neat.  History of Present Illness:   [name] noted to have had a few hospitalizations recently was in the hospital after a fall in May  diagnosed with right fracture femoral patient treated underwent right Hemi arthroscopy transferred to rehab had some complications with recurrent atrial fibrillation patient received both inappropriate iCD therapy for rapid AFib as well as appropriate for VF by notes as well, started on amiodarone and transitioned to p.o. discharge   Readmitted to 11/10/22 with acute/chronic CHF, pneumonia and rapid AFib, managed by his [place] team Noting that he required midodrine for his BP, limiting GDMT as well as his CKD. Amiodardone held 2/2 transaminitis > rate control strategy  Readmitted 12/17/22 with SOB > home O2 increased >> BIPAP felt to be COPD/CHF improved with IV duiretics > held with risoing Creat exacerbation Did have some RVR managed with diltiazem via IM. Cardiology brought on board rates mostly controlled reportedly with brief faster rates advised to continue his BB and f/u w/his [facility] team. Discharged 12/20/22  Readmitted 12/27/22 SOB/CP, tachycardic 140's started on dilt gtt, acute/chronic CHF exacerbation (felt less COPD this admission), febrile to 103 Cardiology felt HR driven by sepsis, managed with amiodarone again and consideration for PRN dig MRSA bacteremia found Neurology consulted yesterday for code stroke with transient R sided weakness and confusion, neck pain. left  ICA thrombus in the setting of MRSA bacteremia and sepsis. with fluctuating symptoms overnight despite anticoagulation, suspect that this is an unstable thrombus. And in thier discussion with neuro IR and vascular surgery, the decision was made to proceed with endovascular mechanical thrombectomy transported to [facility] for this procedure.  Not a TNK candidate There was mention of perhaps meningitis with his severe neck pain, not safe to LP given his anticoagulation, and need to continue anticoagulation.  Neuro favored increasing the blood level goal for his vancomycin to cover him in case he has seeded his meninges as well.   Yesterday 12/29/22 underwent complete revascularization of the  proximal left internal carotid artery with 1 pass with a 6 mm x 47 embotrap  retrieval device with contact aspiration, and proximal flow arrest revealing underlying severe stenosis at the distal aspect of the bulb. Status post stent assisted angioplasty of proximal Lt ICA stenosis with proximal and distal protection.  Post procedure Cangrelor  ID on board here for MRSA bacteremia  Palliative medicine also on board Remains full code  LABS K+ 3.8 BUN/Creat 31/1.73 (looks near/at his recent baseline) Mag 2.1 HS Trop 38, 38 WBC 9.0 Hgb 8.7 > 8.4 > 12.9 (?) > 7.4 Hct 22.7 Plts 89   On midodrine 30mg  TID mestinon Brilinta 90mg  BID (Cangrelor gtt finished yesterday afternoon) Amiodarone gtt  Levophed  Vanc   No OAC/anticoagulation currently  The patient is in bed, getting ready to eat lunch, denies complaints AAO x1 currently Appears in no distress    Past Medical History:  Diagnosis Date   Abdominal aortic aneurysm (AAA) ([name]) 05/13/15  seen on ct scan   Adenomatous colon polyp 03/18/2001, 03/14/2009, 10/06/2014   Anemia    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 pylori infection    History of hepatitis    Hyperlipidemia    Hypertension    Liver cyst 05/16/15   PAF (paroxysmal atrial fibrillation) ([name])    Prostate CA [facility])     Past Surgical History:  Procedure Laterality Date   CATARACT EXTRACTION     COLONOSCOPY  10/06/2014, 09/18/2004, 03/14/2009   ESOPHAGOGASTRODUODENOSCOPY  10/06/2014, 03/18/2001, 03/14/2009   ESOPHAGOGASTRODUODENOSCOPY (EGD) WITH PROPOFOL N/A 05/07/2018   Procedure: ESOPHAGOGASTRODUODENOSCOPY (EGD) WITH PROPOFOL;  Surgeon: Toledo, Boykin Nearing, MD;  Location: [facility] ENDOSCOPY;  Service: Gastroenterology;  Laterality: N/A;   ESOPHAGOGASTRODUODENOSCOPY (EGD) WITH PROPOFOL N/A 04/22/2022   Procedure: ESOPHAGOGASTRODUODENOSCOPY (EGD) WITH PROPOFOL;  Surgeon: Toney Reil, MD;  Location: [facility] ENDOSCOPY;  Service: Endoscopy;  Laterality: N/A;   FLEXIBLE SIGMOIDOSCOPY  08/26/1990   HIP ARTHROPLASTY Right 10/31/2022   Procedure: ARTHROPLASTY BIPOLAR HIP (HEMIARTHROPLASTY)-RNFA;  Surgeon: Reinaldo Berber, MD;  Location: [facility] ORS;  Service: Orthopedics;  Laterality: Right;   INSERTION OF ICD     PROSTATE SURGERY     RADIOLOGY WITH ANESTHESIA N/A 12/29/2022   Procedure: IR WITH ANESTHESIA;  Surgeon: Radiologist, Medication, MD;  Location: [facility] OR;  Service: Radiology;  Laterality: N/A;   TONSILLECTOMY       Home Medications:  Prior to Admission medications   Medication Sig Start Date End Date Taking? Authorizing Provider  acetaminophen (TYLENOL) 325 MG tablet Take 2 tablets (650 mg total) by mouth every 4 (four) hours as needed for headache or mild pain. 10/29/19   Judithe Modest, NP  albuterol (PROVENTIL) (2.5 MG/3ML) 0.083% nebulizer solution Inhale 3 mLs (2.5 mg total) into the lungs as needed for shortness of breath. 10/29/19   Judithe Modest, NP  ascorbic acid (VITAMIN C) 500 MG tablet Take 500 mg by mouth daily.    [provider]  atorvastatin (LIPITOR) 80 MG tablet Take 80 mg  by mouth daily. 05/13/21   [provider]  calcium carbonate (TUMS EX) 750 MG chewable tablet Chew 1 tablet by mouth daily.    [provider]  ELIQUIS 2.5 MG TABS tablet Take 2.5 mg by mouth 2 (two) times daily. 12/28/21   [provider]  fluticasone-salmeterol (ADVAIR) 500-50 MCG/ACT AEPB Inhale 1 puff into the lungs in the morning and at bedtime. 04/05/22   [provider]  gabapentin (NEURONTIN) 100 MG capsule Take 100 mg by mouth at bedtime. 12/09/19   [provider]  guaiFENesin-dextromethorphan (ROBITUSSIN DM) 100-10 MG/5ML syrup Take 5 mLs by mouth every 4 (four) hours as needed for cough. 11/18/22   Enedina Finner, MD  magnesium oxide (MAG-OX) 400 (240 Mg) MG tablet Take 1 tablet by mouth daily. 02/05/21   [provider]  melatonin 3 MG TABS tablet Take 9 mg by mouth at bedtime.    [provider]  metoCLOPramide (REGLAN) 5 MG tablet Take 1 tablet (5 mg total) by mouth every 8 (eight) hours as needed for refractory nausea / vomiting. 05/13/22   Delfino Lovett, MD  metoprolol tartrate 37.5 MG TABS Take 1 tablet (37.5 mg total) by mouth 2 (two) times daily. 12/20/22 01/19/23  Lucile Shutters, MD  midodrine (PROAMATINE) 5 MG  tablet Take 1 tablet (5 mg total) by mouth 3 (three) times daily with meals. Patient taking differently: Take 30 mg by mouth 3 (three) times daily with meals. 11/18/22   Enedina Finner, MD  Multiple Vitamin (MULTIVITAMIN WITH MINERALS) TABS tablet Take 1 tablet by mouth daily. 10/29/19   Judithe Modest, NP  Nebulizer MISC 1 each by Does not apply route as needed. 06/16/21   Rolly Salter, MD  Nutritional Supplements (,FEEDING SUPPLEMENT, PROSOURCE PLUS) liquid Take 30 mLs by mouth 3 (three) times daily between meals. 05/13/22   Delfino Lovett, MD  ondansetron (ZOFRAN-ODT) 4 MG disintegrating tablet Take 4 mg by mouth every 8 (eight) hours as needed for refractory nausea / vomiting, vomiting or nausea.    [provider]  pantoprazole (PROTONIX) 40 MG tablet Take 1 tablet (40 mg total) by mouth 2 (two) times daily. 05/13/22   Delfino Lovett, MD  polyethylene glycol (MIRALAX / GLYCOLAX) 17 g packet Take 17 g by mouth 2 (two) times daily. 11/07/22   Leeroy Bock, MD  pyridostigmine (MESTINON) 60 MG tablet Take 1 tablet (60 mg total) by mouth every 8 (eight) hours. 05/13/22   Delfino Lovett, MD  sertraline (ZOLOFT) 25 MG tablet Take 25 mg by mouth daily.    [provider]  sucralfate (CARAFATE) 1 GM/10ML suspension Take 10 mLs (1 g total) by mouth 4 (four) times daily -  with meals and at bedtime. 05/13/22   Delfino Lovett, MD  tamsulosin (FLOMAX) 0.4 MG CAPS capsule Take 0.4 mg by mouth daily. 12/09/19   [provider]    Inpatient Medications: Scheduled Meds:  aspirin  81 mg Oral Daily   Or   aspirin  81 mg Per Tube Daily   atorvastatin  80 mg Oral QHS   Chlorhexidine Gluconate Cloth  6 each Topical Daily   diclofenac Sodium  4 g Topical QID   gabapentin  100 mg Oral QHS   melatonin  9 mg Oral QHS   midodrine  30 mg Oral TID WC   multivitamin with minerals  1 tablet Oral Daily   pantoprazole  40 mg Oral BID   potassium chloride  20 mEq Oral Once   pyridostigmine  60 mg Oral Q8H   sertraline  25 mg Oral Daily   sucralfate  1 g Oral TID WC & HS   ticagrelor  90 mg Oral BID   Or   ticagrelor  90 mg Per Tube BID   Continuous Infusions:  sodium chloride 75 mL/hr at 12/30/22 1000   sodium chloride Stopped (12/29/22 2230)   amiodarone 30 mg/hr (12/30/22 1000)   clevidipine Stopped (12/29/22 2140)   dexmedetomidine (PRECEDEX) IV infusion 0.3 mcg/kg/hr (12/30/22 1000)   norepinephrine (LEVOPHED) Adult infusion 4 mcg/min (12/30/22 1007)   vancomycin     PRN Meds: acetaminophen **OR** acetaminophen (TYLENOL) oral liquid 160 mg/5 mL **OR** acetaminophen, albuterol, docusate sodium, mouth rinse, polyethylene glycol  Allergies:    Allergies  Allergen Reactions   Nitrofuran Derivatives  Other (See Comments)    Transaminitis ** confounded w/Amiodarone, Mexiletine   Spironolactone     Significant transaminitis     Social History:   Social History   Socioeconomic History   Marital status: Single    Spouse name: Not on file   Number of children: Not on file   Years of education: Not on file   Highest education level: Not on file  Occupational History   Not on file  Tobacco  Use   Smoking status: Former    Current packs/day: 0.00    Average packs/day: 1 pack/day for 38.0 years (38.0 ttl pk-yrs)    Types: Cigarettes    Start date: 06/17/1961    Quit date: 06/18/1999    Years since quitting: 23.5   Smokeless tobacco: Never  Vaping Use   Vaping status: Never Used  Substance and Sexual Activity   Alcohol use: No    Alcohol/week: 0.0 standard drinks of alcohol   Drug use: Not Currently   Sexual activity: Not Currently  Other Topics Concern   Not on file  Social History Narrative   Not on file   Social Determinants of Health   Financial Resource Strain: Low Risk  (12/05/2021)   Received from [facility], [facility]   Overall Financial Resource Strain (CARDIA)    Difficulty of Paying Living Expenses: Not hard at all  Food Insecurity: No Food Insecurity (12/17/2022)   Hunger Vital Sign    Worried About Running Out of Food in the Last Year: Never true    Ran Out of Food in the Last Year: Never true  Transportation Needs: No Transportation Needs (12/17/2022)   PRAPARE - Administrator, Civil Service (Medical): No    Lack of Transportation (Non-Medical): No  Physical Activity: Not on file  Stress: Not on file  Social Connections: Not on file  Intimate Partner Violence: Not At Risk (12/17/2022)   Humiliation, Afraid, Rape, and Kick questionnaire    Fear of Current or Ex-Partner: No    Emotionally Abused: No    Physically Abused: No    Sexually Abused: No    Family History:   Family History  Problem Relation Age of Onset   Heart attack Mother     Heart attack Father      ROS:  Please see the history of present illness.  All other ROS reviewed and negative.     Physical Exam/Data:   Vitals:   12/30/22 0930 12/30/22 0945 12/30/22 1000 12/30/22 1015  BP:      Pulse: (!) 123 (!) 124 (!) 123 (!) 123  Resp: 18 19 19  (!) 24  Temp:      TempSrc:      SpO2: 97% 100% 100% 100%  Weight:        Intake/Output Summary (Last 24 hours) at 12/30/2022 1100 Last data filed at 12/30/2022 1000 Gross per 24 hour  Intake 4113.22 ml  Output 770 ml  Net 3343.22 ml      12/30/2022    5:00 AM 12/29/2022    4:45 PM 12/28/2022    4:11 AM  Last 3 Weights  Weight (lbs) 155 lb 6.8 oz 148 lb 2.4 oz 143 lb 11.8 oz  Weight (kg) 70.5 kg 67.2 kg 65.2 kg     Body mass index is 23.63 kg/m.  General:  Well nourished, well developed, in no acute distress HEENT: normal Neck: no JVD Vascular: No carotid bruits Cardiac:  RRR; no murmurs, gallops or rubs Lungs:  CTA b/l, no wheezing, rhonchi or rales  Abd: soft, nontender Ext: no edema Musculoskeletal:  No deformities Skin: warm and dry  Neuro:  AAO x1 today, otherwise no obvious focal abnormalities noted Psych:  calm and cooperative  EKG:  The EKG was personally reviewed and demonstrates:      Telemetry:  Telemetry was personally reviewed and demonstrates:   PAfib with intermittent tachycardia 120's (suspect is 2:1 flutter rather then ST/AT)  Relevant CV Studies:   12/29/22: TTE 1. Global hypokinesis worse in the septum. Left ventricular ejection  fraction, by estimation, is 30 to 35%. The left ventricle has moderately  decreased function. The left ventricle demonstrates global hypokinesis.  There is mild left ventricular  hypertrophy of the septal segment. Left ventricular diastolic parameters  are indeterminate.   2. Right ventricular systolic function is normal. The right ventricular  size is normal.   3. The mitral valve is normal in structure. Mild mitral valve  regurgitation.  No evidence of mitral stenosis.   4. The aortic valve is normal in structure. Aortic valve regurgitation is  not visualized. No aortic stenosis is present.   5. The inferior vena cava is normal in size with greater than 50%  respiratory variability, suggesting right atrial pressure of 3 mmHg.   Laboratory Data:  High Sensitivity Troponin:   Recent Labs  Lab 12/27/22 1216 12/27/22 1416 12/27/22 2138 12/29/22 2233 12/30/22 0020  TROPONINIHS 29* 28* 30* 38* 38*     Chemistry Recent Labs  Lab 12/27/22 1216 12/28/22 0409 12/29/22 0836 12/29/22 2233 12/30/22 0015 12/30/22 0322  NA 138   < > 132* 137 136 132*  K 4.2   < > 3.7 3.7 3.5 3.8  CL 105   < > 102 105  --  106  CO2 25   < > 21* 20*  --  18*  GLUCOSE 108*   < > 97 91  --  121*  BUN 19   < > 39* 33*  --  31*  CREATININE 1.54*   < > 2.12* 1.86*  --  1.73*  CALCIUM 9.4   < > 7.7* 8.1*  --  7.9*  MG 1.9  --   --  1.6*  --  2.1  GFRNONAA 45*   < > 31* 36*  --  39*  ANIONGAP 8   < > 9 12  --  8   < > = values in this interval not displayed.    Recent Labs  Lab 12/27/22 1216 12/28/22 0409 12/29/22 0836  PROT 6.2* 5.7* 5.1*  ALBUMIN 3.5 3.3* 2.8*  AST 25 22 28   ALT 25 21 20   ALKPHOS 81 66 53  BILITOT 0.6 0.9 0.9   Lipids  Recent Labs  Lab 12/30/22 0322  CHOL 103  TRIG 116  HDL 23*  LDLCALC 57  CHOLHDL 4.5    Hematology Recent Labs  Lab 12/28/22 0409 12/29/22 0836 12/30/22 0015 12/30/22 0322  WBC 20.6* 12.9*  --  9.0  RBC 2.92* 2.83*  --  2.46*  HGB 8.7* 8.4* 12.9* 7.4*  HCT 26.8* 26.2* 38.0* 22.7*  MCV 91.8 92.6  --  92.3  MCH 29.8 29.7  --  30.1  MCHC 32.5 32.1  --  32.6  RDW 15.0 14.9  --  15.0  PLT 141* 109*  --  92*  89*   Thyroid No results for input(s): "TSH", "FREET4" in the last 168 hours.  BNP Recent Labs  Lab 12/27/22 1216  BNP 1,110.6*    DDimer  Recent Labs  Lab 12/30/22 0322  DDIMER 0.72*     Radiology/Studies:  CT HEAD WO CONTRAST ( ) Result Date:  12/30/2022 CLINICAL DATA:  TIA EXAM: CT HEAD WITHOUT CONTRAST TECHNIQUE: Contiguous axial images were obtained from the base of the skull through the vertex without intravenous contrast. RADIATION DOSE REDUCTION: This exam was performed according to the departmental dose-optimization program which includes automated exposure control, adjustment of  the mA and/or kV according to patient size and/or use of iterative reconstruction technique. COMPARISON:  12/29/2022 CTA head and neck, 9:30 a.m. FINDINGS: Evaluation is somewhat limited by motion artifact. Brain: Suspect trace subarachnoid hyperdense material in the left frontal and parietal lobe, near the vertex (series 10, images 40-41 and series 6, image 15), which is new from the prior exam and may represent trace subarachnoid hemorrhage versus contrast staining from recent interventional procedure. No evidence of acute infarction, mass, mass effect, or midline shift. No hydrocephalus. Vascular: No hyperdense vessel. Skull: Negative for fracture or focal lesion. Sinuses/Orbits: Mucosal thickening in the ethmoid air cells. No acute finding in the orbits. Other: The mastoid air cells are well aerated. IMPRESSION: Suspect trace subarachnoid hyperdense material in the left frontal and parietal lobe, near the vertex, which is new from the prior exam and may represent trace subarachnoid hemorrhage versus contrast staining from recent interventional procedure. These results will be called to the ordering clinician or representative by the Radiologist Assistant, and communication documented in the PACS or [employer]. Electronically Signed   By: Wiliam Ke M.D.   On: 12/30/2022 00:11   CT ANGIO HEAD NECK W WO CM W PERF (CODE STROKE) Result Date: 12/29/2022 CLINICAL DATA:  82 year old male inpatient code stroke presentation. Right side weakness and aphasia. EXAM: CT ANGIOGRAPHY HEAD AND NECK WITH AND WITHOUT CONTRAST TECHNIQUE: Multidetector CT imaging of the head  and neck was performed using the standard protocol during bolus administration of intravenous contrast. Multiplanar CT image reconstructions and MIPs were obtained to evaluate the vascular anatomy. Carotid stenosis measurements (when applicable) are obtained utilizing NASCET criteria, using the distal internal carotid diameter as the denominator. RADIATION DOSE REDUCTION: This exam was performed according to the departmental dose-optimization program which includes automated exposure control, adjustment of the mA and/or kV according to patient size and/or use of iterative reconstruction technique. CONTRAST:  60mL OMNIPAQUE IOHEXOL 350 MG/ML SOLN COMPARISON:  Plain head CT 10/30/2022. FINDINGS: CT HEAD Brain: No midline shift, ventriculomegaly, mass effect, evidence of mass lesion, intracranial hemorrhage or evidence of cortically based acute infarction. Gray-white differentiation appears stable, symmetric, within normal limits for age. ASPECTS 10. Calvarium and skull base: No acute osseous abnormality identified. Paranasal sinuses: Visualized paranasal sinuses and mastoids are stable and well aerated. Orbits: No acute orbit or scalp soft tissue finding. CTA NECK Skeleton: Mild mandible motion artifact. Cervical spine degeneration although generally mild for age. No acute osseous abnormality identified. Upper chest: Left chest pacemaker. Small layering pleural effusions. Mild respiratory motion, mild paraseptal emphysema. Grossly patent visible central pulmonary arteries. Other neck: No acute neck soft tissue finding. Aortic arch: 3 vessel arch. Moderate Calcified aortic atherosclerosis. Right carotid system: Brachiocephalic artery and proximal right CCA are patent with mild plaque and no stenosis. Mild to moderate soft and calcified plaque at the right ICA origin and bulb with no stenosis. Left carotid system: Mild to moderate soft and calcified plaque at the left CCA origin without stenosis. Mild motion artifact.  Occasional other left CCA plaque before the bifurcation without stenosis. Moderate calcified plaque at the left ICA origin and bulb. No significant ICA origin stenosis, but beginning at the medial distal bulb there is adherent thrombus within the vessel over a segment of nearly 2 cm. The string like thrombus continues within the lumen to series 9, image 93. The vessel remains patent, but at the distal bulb high-grade stenosis is numerically estimated at 80 % with respect to the distal vessel, series 11,  image 195. Superimposed calcified plaque of the left ICA just below the skull base. Vertebral arteries: Proximal subclavian arteries and vertebral artery origins are patent with no significant stenosis despite plaque. The vertebral arteries are diminutive, fairly codominant, and remain patent to the skull base. CTA HEAD Posterior circulation: Distal vertebral arteries and vertebrobasilar junction are diminutive but patent. Left V4 is mildly dominant. Patent bilateral PICA/AICA. Patent basilar artery without stenosis. Patent basilar tip and SCA origins. Fetal type bilateral PCA origins. Tortuous posterior communicating arteries. Bilateral PCA branches remain patent. On the left there is mild to moderate P3 irregularity and stenosis on series 15, image 29. Anterior circulation: Both ICA siphons are patent and enhance symmetrically. Moderate left siphon calcified plaque. Only mild left siphon stenosis. Normal left posterior communicating artery origin. Similar moderate right siphon calcified plaque. Mild supraclinoid right siphon stenosis. Normal right posterior communicating artery origin. Patent carotid termini. Patent MCA and ACA origins. Anterior communicating artery is within normal limits. Bilateral ACA branches are within normal limits. Right MCA M1 segment and bifurcation are patent without stenosis. Left MCA M1 segment and bifurcation are patent without stenosis. No discrete MCA branch occlusion is identified.  Venous sinuses: Patent. Anatomic variants: Diminutive vertebrobasilar system on the basis of fetal PCA origins. Review of the MIP images confirms the above findings Preliminary report of the above Study discussed by telephone with Dr. Ritta Slot on 12/29/2022 at 0930 hours. IMPRESSION: 1. CTA is negative for large vessel occlusion but Positive for bulky, elongated thrombus within the Left ICA beginning at the distal bulb. The vessel remains patent, but with high-grade estimated 80% stenosis. This was discussed by telephone with Dr. Ritta Slot on 12/29/2022 at 0943 hours. 2. Stable and negative for age noncontrast CT appearance of the brain. 3. Atherosclerosis in the head and neck but generally only mild subsequent stenosis. Up to Moderate Left PCA P3 branch stenosis. 4. Aortic Atherosclerosis (ICD10-I70.0). Small layering pleural effusions. Electronically Signed   By: Odessa Fleming M.D.   On: 12/29/2022 09:52    DG Chest 2 View Result Date: 12/27/2022 CLINICAL DATA:  Shortness of breath EXAM: CHEST - 2 VIEW COMPARISON:  Previous studies including the examination of 12/17/2022 FINDINGS: Transverse diameter of heart is increased. Increase in AP diameter of chest suggests COPD. Thoracic aorta is tortuous and ectatic. There are no signs of pulmonary edema or focal pulmonary consolidation. There is interval clearing of vascular congestion. There is no pleural effusion or pneumothorax. Pacemaker/defibrillator battery is seen in the left infraclavicular region. IMPRESSION: Cardiomegaly. There are no signs of pulmonary edema or focal pulmonary consolidation. Electronically Signed   By: Ernie Avena M.D.   On: 12/27/2022 12:35     Assessment and Plan:   VT ICD In d/w MDT industry rep, device implanted 2019 Secondary prevention MRSA Staph bacteremia   Dr. Lalla Brothers has seen the patient Currently not an extraction candidate 2/2 DAPT, marked anemia, hypotension on pressor and comorbid medical  issues. Defer +/- TEE to ID Defer restart his OAC to neurology/IR/IM teams     Risk Assessment/Risk Scores:    For questions or updates, please contact [facility] Please consult [email or web address] for contact info under    Signed, Sheilah Pigeon, PA-C  12/30/2022 11:00 AM

## 2022-12-30 NOTE — Progress Notes (Signed)
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 Corliss Skains 12/29/22  Subjective: Patient doing well, sitting up in bed and he's just had lunch ordered for him. His son and daughter-in-law are at the bedside. He complains of left neck pain. He is able to move all extremities but is weaker on the right side.   Allergies: Nitrofuran derivatives and Spironolactone  Medications: Prior to Admission medications   Medication Sig Start Date End Date Taking? Authorizing Provider  acetaminophen (TYLENOL) 325 MG tablet Take 2 tablets (650 mg total) by mouth every 4 (four) hours as needed for headache or mild pain. 10/29/19   Judithe Modest, NP  albuterol (PROVENTIL) (2.5 MG/3ML) 0.083% nebulizer solution Inhale 3 mLs (2.5 mg total) into the lungs as needed for shortness of breath. 10/29/19   Judithe Modest, NP  ascorbic acid (VITAMIN C) 500 MG tablet Take 500 mg by mouth daily.    [provider]  atorvastatin (LIPITOR) 80 MG tablet Take 80 mg by mouth daily. 05/13/21   [provider]  calcium carbonate (TUMS EX) 750 MG chewable tablet Chew 1 tablet by mouth daily.    [provider]  ELIQUIS 2.5 MG TABS tablet Take 2.5 mg by mouth 2 (two) times daily. 12/28/21   [provider]  fluticasone-salmeterol (ADVAIR) 500-50 MCG/ACT AEPB Inhale 1 puff into the lungs in the morning and at bedtime. 04/05/22   [provider]  gabapentin (NEURONTIN) 100 MG capsule Take 100 mg by mouth at bedtime. 12/09/19   [provider]  guaiFENesin-dextromethorphan (ROBITUSSIN DM) 100-10 MG/5ML syrup Take 5 mLs by mouth every 4 (four) hours as needed for cough. 11/18/22   Enedina Finner, MD  magnesium oxide (MAG-OX) 400 (240 Mg) MG tablet Take 1 tablet by mouth daily. 02/05/21   [provider]  melatonin 3 MG TABS tablet Take 9 mg by mouth at bedtime.    [provider]  metoCLOPramide (REGLAN) 5 MG tablet Take 1 tablet (5 mg total) by mouth every 8 (eight) hours as needed for refractory nausea / vomiting. 05/13/22   Delfino Lovett, MD  metoprolol tartrate 37.5 MG TABS Take 1 tablet (37.5 mg total) by mouth 2 (two) times daily. 12/20/22 01/19/23  Lucile Shutters, MD  midodrine (PROAMATINE) 5 MG tablet Take 1 tablet (5 mg total) by mouth 3 (three) times daily with meals. Patient taking differently: Take 30 mg by mouth 3 (three) times daily with meals. 11/18/22   Enedina Finner, MD  Multiple Vitamin (MULTIVITAMIN WITH MINERALS) TABS tablet Take 1 tablet by mouth daily. 10/29/19   Judithe Modest, NP  Nebulizer MISC 1 each by Does not apply route as needed. 06/16/21   Rolly Salter, MD  Nutritional Supplements (,FEEDING SUPPLEMENT, PROSOURCE PLUS) liquid Take 30 mLs by mouth 3 (three) times daily between meals. 05/13/22   Delfino Lovett, MD  ondansetron (ZOFRAN-ODT) 4 MG disintegrating tablet Take 4 mg by mouth every 8 (eight) hours as needed for refractory nausea / vomiting, vomiting or nausea.    [provider]  pantoprazole (PROTONIX) 40 MG tablet Take 1 tablet (40 mg total) by mouth 2 (two) times daily. 05/13/22   Delfino Lovett, MD  polyethylene glycol (MIRALAX / GLYCOLAX) 17 g packet Take 17 g by mouth 2 (two) times daily. 11/07/22   Leeroy Bock, MD  pyridostigmine (MESTINON)  60 MG tablet Take 1 tablet (60 mg total) by mouth every 8 (eight) hours. 05/13/22   Delfino Lovett, MD  sertraline (ZOLOFT) 25 MG tablet Take 25 mg by mouth daily.    [provider]  sucralfate (CARAFATE) 1 GM/10ML suspension Take 10 mLs (1 g total) by mouth 4 (four) times daily -  with meals and at bedtime. 05/13/22   Delfino Lovett, MD  tamsulosin (FLOMAX) 0.4 MG CAPS capsule Take 0.4 mg by mouth daily. 12/09/19   [provider]     Vital Signs: BP 113/70   Pulse (!)  101   Temp 97.6 F (36.4 C) (Oral)   Resp 12   Wt 155 lb 6.8 oz (70.5 kg)   SpO2 100%   BMI 23.63 kg/m   Physical Exam Constitutional:      General: He is not in acute distress. Cardiovascular:     Comments: Right groin vascular site is clean, soft, dry and non-tender.  Pulmonary:     Effort: Pulmonary effort is normal.  Skin:    General: Skin is warm and dry.  Neurological:     Mental Status: He is alert and oriented to person, place, and time.     Cranial Nerves: No dysarthria or facial asymmetry.     Motor: Weakness present.     Comments: Right sided weakness.      Imaging: CT HEAD WO CONTRAST ( )  Result Date: 12/30/2022 CLINICAL DATA:  TIA EXAM: CT HEAD WITHOUT CONTRAST TECHNIQUE: Contiguous axial images were obtained from the base of the skull through the vertex without intravenous contrast. RADIATION DOSE REDUCTION: This exam was performed according to the departmental dose-optimization program which includes automated exposure control, adjustment of the mA and/or kV according to patient size and/or use of iterative reconstruction technique. COMPARISON:  12/29/2022 CTA head and neck, 9:30 a.m. FINDINGS: Evaluation is somewhat limited by motion artifact. Brain: Suspect trace subarachnoid hyperdense material in the left frontal and parietal lobe, near the vertex (series 10, images 40-41 and series 6, image 15), which is new from the prior exam and may represent trace subarachnoid hemorrhage versus contrast staining from recent interventional procedure. No evidence of acute infarction, mass, mass effect, or midline shift. No hydrocephalus. Vascular: No hyperdense vessel. Skull: Negative for fracture or focal lesion. Sinuses/Orbits: Mucosal thickening in the ethmoid air cells. No acute finding in the orbits. Other: The mastoid air cells are well aerated. IMPRESSION: Suspect trace subarachnoid hyperdense material in the left frontal and parietal lobe, near the vertex, which is new  from the prior exam and may represent trace subarachnoid hemorrhage versus contrast staining from recent interventional procedure. These results will be called to the ordering clinician or representative by the Radiologist Assistant, and communication documented in the PACS or [employer]. Electronically Signed   By: Wiliam Ke M.D.   On: 12/30/2022 00:11   CT ANGIO HEAD NECK W WO CM W PERF (CODE STROKE)  Result Date: 12/29/2022 CLINICAL DATA:  82 year old male inpatient code stroke presentation. Right side weakness and aphasia. EXAM: CT ANGIOGRAPHY HEAD AND NECK WITH AND WITHOUT CONTRAST TECHNIQUE: Multidetector CT imaging of the head and neck was performed using the standard protocol during bolus administration of intravenous contrast. Multiplanar CT image reconstructions and MIPs were obtained to evaluate the vascular anatomy. Carotid stenosis measurements (when applicable) are obtained utilizing NASCET criteria, using the distal internal carotid diameter as the denominator. RADIATION DOSE REDUCTION: This exam was performed according to the departmental dose-optimization program which includes  automated exposure control, adjustment of the mA and/or kV according to patient size and/or use of iterative reconstruction technique. CONTRAST:  60mL OMNIPAQUE IOHEXOL 350 MG/ML SOLN COMPARISON:  Plain head CT 10/30/2022. FINDINGS: CT HEAD Brain: No midline shift, ventriculomegaly, mass effect, evidence of mass lesion, intracranial hemorrhage or evidence of cortically based acute infarction. Gray-white differentiation appears stable, symmetric, within normal limits for age. ASPECTS 10. Calvarium and skull base: No acute osseous abnormality identified. Paranasal sinuses: Visualized paranasal sinuses and mastoids are stable and well aerated. Orbits: No acute orbit or scalp soft tissue finding. CTA NECK Skeleton: Mild mandible motion artifact. Cervical spine degeneration although generally mild for age. No acute  osseous abnormality identified. Upper chest: Left chest pacemaker. Small layering pleural effusions. Mild respiratory motion, mild paraseptal emphysema. Grossly patent visible central pulmonary arteries. Other neck: No acute neck soft tissue finding. Aortic arch: 3 vessel arch. Moderate Calcified aortic atherosclerosis. Right carotid system: Brachiocephalic artery and proximal right CCA are patent with mild plaque and no stenosis. Mild to moderate soft and calcified plaque at the right ICA origin and bulb with no stenosis. Left carotid system: Mild to moderate soft and calcified plaque at the left CCA origin without stenosis. Mild motion artifact. Occasional other left CCA plaque before the bifurcation without stenosis. Moderate calcified plaque at the left ICA origin and bulb. No significant ICA origin stenosis, but beginning at the medial distal bulb there is adherent thrombus within the vessel over a segment of nearly 2 cm. The string like thrombus continues within the lumen to series 9, image 93. The vessel remains patent, but at the distal bulb high-grade stenosis is numerically estimated at 80 % with respect to the distal vessel, series 11, image 195. Superimposed calcified plaque of the left ICA just below the skull base. Vertebral arteries: Proximal subclavian arteries and vertebral artery origins are patent with no significant stenosis despite plaque. The vertebral arteries are diminutive, fairly codominant, and remain patent to the skull base. CTA HEAD Posterior circulation: Distal vertebral arteries and vertebrobasilar junction are diminutive but patent. Left V4 is mildly dominant. Patent bilateral PICA/AICA. Patent basilar artery without stenosis. Patent basilar tip and SCA origins. Fetal type bilateral PCA origins. Tortuous posterior communicating arteries. Bilateral PCA branches remain patent. On the left there is mild to moderate P3 irregularity and stenosis on series 15, image 29. Anterior  circulation: Both ICA siphons are patent and enhance symmetrically. Moderate left siphon calcified plaque. Only mild left siphon stenosis. Normal left posterior communicating artery origin. Similar moderate right siphon calcified plaque. Mild supraclinoid right siphon stenosis. Normal right posterior communicating artery origin. Patent carotid termini. Patent MCA and ACA origins. Anterior communicating artery is within normal limits. Bilateral ACA branches are within normal limits. Right MCA M1 segment and bifurcation are patent without stenosis. Left MCA M1 segment and bifurcation are patent without stenosis. No discrete MCA branch occlusion is identified. Venous sinuses: Patent. Anatomic variants: Diminutive vertebrobasilar system on the basis of fetal PCA origins. Review of the MIP images confirms the above findings Preliminary report of the above Study discussed by telephone with Dr. Ritta Slot on 12/29/2022 at 0930 hours. IMPRESSION: 1. CTA is negative for large vessel occlusion but Positive for bulky, elongated thrombus within the Left ICA beginning at the distal bulb. The vessel remains patent, but with high-grade estimated 80% stenosis. This was discussed by telephone with Dr. Ritta Slot on 12/29/2022 at 0943 hours. 2. Stable and negative for age noncontrast CT appearance of the brain. 3.  Atherosclerosis in the head and neck but generally only mild subsequent stenosis. Up to Moderate Left PCA P3 branch stenosis. 4. Aortic Atherosclerosis (ICD10-I70.0). Small layering pleural effusions. Electronically Signed   By: Odessa Fleming M.D.   On: 12/29/2022 09:52   ECHOCARDIOGRAM COMPLETE  Result Date: 12/29/2022    ECHOCARDIOGRAM REPORT   Patient Name:   Matthew Zamora Date of Exam: 12/29/2022 Medical Rec #:  [id number]           Height:       68.0 in Accession #:    [id number]          Weight:       143.7 lb Date of Birth:  12/30/1940           BSA:          1.776 m Patient Age:    81 years             BP:           77/49 mmHg Patient Gender: M                   HR:           133 bpm. Exam Location:  [facility] Procedure: 2D Echo Indications:     CHF I50.21  History:         Patient has prior history of Echocardiogram examinations, most                  recent 11/10/2022.  Sonographer:     Overton Mam RDCS, FASE Referring Phys:  [id number] Hubbard Hartshorn OUMA Diagnosing Phys: Chilton Si MD  Sonographer Comments: Technically challenging study due to limited acoustic windows and suboptimal parasternal window. Image acquisition challenging due to uncooperative patient. Unable to perform a complete echo at this time due to patient being extremely uncooperative. IMPRESSIONS  1. Global hypokinesis worse in the septum. Left ventricular ejection fraction, by estimation, is 30 to 35%. The left ventricle has moderately decreased function. The left ventricle demonstrates global hypokinesis. There is mild left ventricular hypertrophy of the septal segment. Left ventricular diastolic parameters are indeterminate.  2. Right ventricular systolic function is normal. The right ventricular size is normal.  3. The mitral valve is normal in structure. Mild mitral valve regurgitation. No evidence of mitral stenosis.  4. The aortic valve is normal in structure. Aortic valve regurgitation is not visualized. No aortic stenosis is present.  5. The inferior vena cava is normal in size with greater than 50% respiratory variability, suggesting right atrial pressure of 3 mmHg. FINDINGS  Left Ventricle: Global hypokinesis worse in the septum. Left ventricular ejection fraction, by estimation, is 30 to 35%. The left ventricle has moderately decreased function. The left ventricle demonstrates global hypokinesis. The left ventricular internal cavity size was normal in size. There is mild left ventricular hypertrophy of the septal segment. Left ventricular diastolic parameters are indeterminate. Right Ventricle: The right ventricular size is  normal. No increase in right ventricular wall thickness. Right ventricular systolic function is normal. Left Atrium: Left atrial size was normal in size. Right Atrium: Right atrial size was normal in size. Pericardium: There is no evidence of pericardial effusion. Mitral Valve: The mitral valve is normal in structure. Mild mitral valve regurgitation. No evidence of mitral valve stenosis. Tricuspid Valve: The tricuspid valve is normal in structure. Tricuspid valve regurgitation is trivial. No evidence of tricuspid stenosis. Aortic Valve: The aortic valve is normal in structure. Aortic valve regurgitation is  not visualized. No aortic stenosis is present. Aortic valve peak gradient measures 7.2 mmHg. Pulmonic Valve: The pulmonic valve was normal in structure. Pulmonic valve regurgitation is not visualized. No evidence of pulmonic stenosis. Aorta: The aortic root is normal in size and structure. Venous: The inferior vena cava is normal in size with greater than 50% respiratory variability, suggesting right atrial pressure of 3 mmHg. IAS/Shunts: No atrial level shunt detected by color flow Doppler.  LEFT VENTRICLE PLAX 2D LVIDd:         5.20 cm     Diastology LVIDs:         4.40 cm     LV e' medial:    8.38 cm/s LV PW:         1.00 cm     LV E/e' medial:  13.6 LV IVS:        1.20 cm     LV e' lateral:   13.90 cm/s LVOT diam:     1.80 cm     LV E/e' lateral: 8.2 LVOT Area:     2.54 cm  LV Volumes (MOD) LV vol d, MOD A4C: 74.3 ml LV vol s, MOD A4C: 48.7 ml LV SV MOD A4C:     74.3 ml LEFT ATRIUM         Index LA diam:    4.00 cm 2.25 cm/m  AORTIC VALVE              PULMONIC VALVE AV Vmax:      134.00 cm/s PV Vmax:       0.93 m/s AV Peak Grad: 7.2 mmHg    PV Peak grad:  3.5 mmHg  AORTA Ao Root diam: 3.60 cm MITRAL VALVE                TRICUSPID VALVE MV Area (PHT): 6.02 cm     TR Peak grad:   23.2 mmHg MV Decel Time: 126 msec     TR Vmax:        241.00 cm/s MV E velocity: 114.00 cm/s                             SHUNTS                              Systemic Diam: 1.80 cm Chilton Si MD Electronically signed by Chilton Si MD Signature Date/Time: 12/29/2022/9:43:31 AM    Final    CT Angio Chest PE W and/or Wo Contrast  Result Date: 12/27/2022 CLINICAL DATA:  Pulmonary embolus suspected with high probability. Shortness of breath. COPD. EXAM: CT ANGIOGRAPHY CHEST WITH CONTRAST TECHNIQUE: Multidetector CT imaging of the chest was performed using the standard protocol during bolus administration of intravenous contrast. Multiplanar CT image reconstructions and MIPs were obtained to evaluate the vascular anatomy. RADIATION DOSE REDUCTION: This exam was performed according to the departmental dose-optimization program which includes automated exposure control, adjustment of the mA and/or kV according to patient size and/or use of iterative reconstruction technique. CONTRAST:  75mL OMNIPAQUE IOHEXOL 350 MG/ML SOLN COMPARISON:  Chest radiograph 12/17/2022.  CT 11/10/2022. FINDINGS: Cardiovascular: Good opacification of the central and segmental pulmonary arteries resulting in technically adequate study. No focal filling defects. No evidence of significant pulmonary embolus. Normal heart size. No pericardial effusions. Normal caliber thoracic aorta. Calcification of the aorta and coronary arteries. Mediastinum/Nodes: Thyroid gland is unremarkable. Esophagus is decompressed. No  significant lymphadenopathy. Lungs/Pleura: Emphysematous changes and scattered fibrosis in the lungs. Bronchial wall thickening, subpleural fibrosis and ground-glass changes likely representing changes of respiratory bronchiolitis. No pleural effusions. No pneumothorax. Upper Abdomen: Cholelithiasis with small stone in the gallbladder. No inflammatory infiltration demonstrated. Mass in the inferior right lobe of the liver measuring 4.9 x 5.5 cm in diameter. This has been present on previous studies including CT abdomen and pelvis from 03/27/2022 and dating back  to 05/16/2015. Lesion is slowly growing but long-term presence of the lesion suggest likely benign etiology. This could represent a biliary cystadenoma. The lesion is incompletely visualized and not well characterized on this study which was optimized for evaluation of pulmonary arteries. Musculoskeletal: No acute bony abnormalities. Old right rib fractures. Review of the MIP images confirms the above findings. IMPRESSION: 1. No evidence of significant pulmonary embolus. 2. 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 3. Cholelithiasis without inflammatory change. 4. Cystic lesion in the liver is incompletely visualized but present on prior studies. Likely benign. 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 5. Aortic atherosclerosis. Electronically Signed   By: Burman Nieves M.D.   On: 12/27/2022 17:48   DG Chest 2 View  Result Date: 12/27/2022 CLINICAL DATA:  Shortness of breath EXAM: CHEST - 2 VIEW COMPARISON:  Previous studies including the examination of 12/17/2022 FINDINGS: Transverse diameter of heart is increased. Increase in AP diameter of chest suggests COPD. Thoracic aorta is tortuous and ectatic. There are no signs of pulmonary edema or focal pulmonary consolidation. There is interval clearing of vascular congestion. There is no pleural effusion or pneumothorax. Pacemaker/defibrillator battery is seen in the left infraclavicular region. IMPRESSION: Cardiomegaly. There are no signs of pulmonary edema or focal pulmonary consolidation. Electronically Signed   By: Ernie Avena M.D.   On: 12/27/2022 12:35    Labs:  CBC: Recent Labs    12/27/22 1216 12/28/22 0409 12/29/22 0836 12/30/22 0015 12/30/22 0322  WBC 10.9* 20.6* 12.9*  --  9.0  HGB 9.7* 8.7* 8.4* 12.9* 7.4*  HCT 31.5* 26.8* 26.2* 38.0* 22.7*  PLT 178 141* 109*  --  92*  89*    COAGS: Recent Labs    10/31/22 0611 12/30/22 0322  INR 1.2 1.4*  APTT 34 50*    BMP: Recent Labs    12/28/22 0409  12/29/22 0836 12/29/22 2233 12/30/22 0015 12/30/22 0322  NA 134* 132* 137 136 132*  K 4.1 3.7 3.7 3.5 3.8  CL 103 102 105  --  106  CO2 24 21* 20*  --  18*  GLUCOSE 102* 97 91  --  121*  BUN 29* 39* 33*  --  31*  CALCIUM 8.4* 7.7* 8.1*  --  7.9*  CREATININE 1.76* 2.12* 1.86*  --  1.73*  GFRNONAA 39* 31* 36*  --  39*    LIVER FUNCTION TESTS: Recent Labs    12/17/22 0202 12/27/22 1216 12/28/22 0409 12/29/22 0836  BILITOT 0.8 0.6 0.9 0.9  AST 22 25 22 28   ALT 12 25 21 20   ALKPHOS 89 81 66 53  PROT 6.2* 6.2* 5.7* 5.1*  ALBUMIN 3.5 3.5 3.3* 2.8*    Assessment and Plan:  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 Corliss Skains 12/29/22  Patient doing well today with a main complaint of left neck pain. He is moving all extremities but is weaker on the right side. Right groin vascular site is clean, soft, dry and non-tender. Post procedure imaging completed: CT Head 12/29/22 @ 2025 IMPRESSION:  Suspect trace subarachnoid hyperdense material in the left frontal and parietal lobe, near the vertex, which is new from the prior exam and may represent trace subarachnoid hemorrhage versus contrast staining from recent interventional procedure.  Patient is on an amiodarone infusion for atrial fibrillation. Levophed also infusing. Aspirin and Brilinta started today.   Plans per Neurology/Primary teams. IR will continue to follow.   Electronically Signed: Alwyn Ren, AGACNP-BC [phone] 12/30/2022, 9:33 AM   I spent a total of 15 Minutes at the the patient's bedside AND on the patient's hospital floor or unit, greater than 50% of which was counseling/coordinating care for post stroke care.

## 2022-12-30 NOTE — Evaluation (Signed)
Occupational Therapy Evaluation Patient Details Name: Matthew Zamora MRN: [id number] DOB: June 01, 1941 Today's Date: 12/30/2022   History of Present Illness 82 yo male admitted to [facility] 7/12 with SOB with decompensated HF. 7/13 hypotension with sepsis. 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  Extubated post procedure but very confused. PMhx: COPD, CHF, AFib, CKD, HTN   Clinical Impression   Pt evaluated s/p above admission list. Pt reports independence with ADLs and functional mobility with occasional use of RW, receives assistance from DIL for IADLs and transportation at baseline. Pt presents this session with generalized weakness, left shoulder pain, agitation, and decreased cognition requiring max multimodal cues for safety and sequencing tasks. Pt currently requires min A for seated UB ADLs and max A for LB ADLs. Pt completed STS transfer from recliner using RW with mod A +2 and max multimodal cues for hand placement. Pt completed step pivot transfer from chair>bed using RW with min A +2 and max multimodal cues for sequencing task. Pt would benefit from continued acute OT services to maximize functional mobility and facilitate transition to skilled inpatient follow up therapy, <3 hours/day.  Session vitals:  SpO2 >92% on 3L [place] throughout session, HR 110s-120s     Recommendations for follow up therapy are one component of a multi-disciplinary discharge planning process, led by the attending physician.  Recommendations may be updated based on patient status, additional functional criteria and insurance authorization.   Assistance Recommended at Discharge Frequent or constant Supervision/Assistance  Patient can return home with the following Two people to help with walking and/or transfers;A lot of help with bathing/dressing/bathroom;Assistance with cooking/housework;Direct  supervision/assist for financial management;Direct supervision/assist for medications management;Assist for transportation;Help with stairs or ramp for entrance    Functional Status Assessment  Patient has had a recent decline in their functional status and demonstrates the ability to make significant improvements in function in a reasonable and predictable amount of time.  Equipment Recommendations  Other (comment) (defer)    Recommendations for Other Services       Precautions / Restrictions Precautions Precautions: Fall;Other (comment) Precaution Comments: art line Restrictions Weight Bearing Restrictions: No      Mobility Bed Mobility Overal bed mobility: Needs Assistance Bed Mobility: Sit to Supine       Sit to supine: Mod assist   General bed mobility comments: BLE management    Transfers Overall transfer level: Needs assistance Equipment used: Rolling walker (2 wheels) Transfers: Sit to/from Stand, Bed to chair/wheelchair/BSC Sit to Stand: Mod assist, +2 physical assistance, +2 safety/equipment Stand pivot transfers: Min assist, +2 physical assistance, +2 safety/equipment         General transfer comment: STS from recliner with mod A +2 using RW with max multimodal cues for hand placement. Stand pivot transfer from chair>EOB with min A +2 and max multimodal cues for sequencing task      Balance Overall balance assessment: Needs assistance Sitting-balance support: No upper extremity supported, Feet supported Sitting balance-Leahy Scale: Fair Sitting balance - Comments: sitting EOB   Standing balance support: Bilateral upper extremity supported, Reliant on assistive device for balance, During functional activity Standing balance-Leahy Scale: Poor Standing balance comment: BUE support on RW for stability                           ADL either performed or assessed with clinical judgement   ADL  Overall ADL's : Needs  assistance/impaired Eating/Feeding: Set up;Sitting   Grooming: Set up;Sitting   Upper Body Bathing: Minimal assistance;Sitting   Lower Body Bathing: Maximal assistance;Sitting/lateral leans   Upper Body Dressing : Minimal assistance;Sitting   Lower Body Dressing: Maximal assistance;Sit to/from stand   Toilet Transfer: Moderate assistance;+2 for physical assistance;+2 for safety/equipment;BSC/3in1;Rolling walker (2 wheels) Toilet Transfer Details (indicate cue type and reason): simulated Toileting- Clothing Manipulation and Hygiene: Maximal assistance;Sit to/from stand       Functional mobility during ADLs: Moderate assistance;Rolling walker (2 wheels);+2 for physical assistance;+2 for safety/equipment General ADL Comments: limited secondary to generalized weakness, decreased activity tolerance, pain, and agitation.     Vision Baseline Vision/History: 0 No visual deficits Ability to See in Adequate Light: 0 Adequate Vision Assessment?: No apparent visual deficits     Perception Perception Perception Tested?: No   Praxis Praxis Praxis tested?: Not tested    Pertinent Vitals/Pain Pain Assessment Pain Assessment: Faces Faces Pain Scale: Hurts whole lot Pain Location: left shoulder Pain Descriptors / Indicators: Discomfort, Grimacing, Moaning Pain Intervention(s): Limited activity within patient's tolerance, Monitored during session     Hand Dominance Right   Extremity/Trunk Assessment Upper Extremity Assessment Upper Extremity Assessment: Generalized weakness RUE Deficits / Details: pt able to lift shoulder to grossly 70 degrees would not bring higher and agitated with attempts at assessment LUE Deficits / Details: pt reporting upper trapezius pain and refused significant movement due to pain   Lower Extremity Assessment Lower Extremity Assessment: Defer to PT evaluation RLE Deficits / Details: at least 3/5 strength for quads and hamstring, pt with difficulty with hip  flexion grossly 2/5, pt not agreeable to formal testing LLE Deficits / Details: grossly 3/5   Cervical / Trunk Assessment Cervical / Trunk Assessment: Kyphotic;Other exceptions Cervical / Trunk Exceptions: forward head   Communication Communication Communication: HOH   Cognition Arousal/Alertness: Awake/alert Behavior During Therapy: Agitated Overall Cognitive Status: Impaired/Different from baseline Area of Impairment: Attention, Following commands, Safety/judgement, Awareness, Problem solving                   Current Attention Level: Focused   Following Commands: Follows one step commands inconsistently, Follows one step commands with increased time Safety/Judgement: Decreased awareness of safety, Decreased awareness of deficits Awareness: Intellectual Problem Solving: Slow processing, Requires verbal cues, Requires tactile cues General Comments: Pt agitated throughout session and perseverating on left shoulder pain. Pt with limited safety awareness and required max multimodal cues throughout session for safety and sequencing.     General Comments  SpO2 >92% on 3L [place] throughout session, HR 110s-120s    Exercises     Shoulder Instructions      Home Living Family/patient expects to be discharged to:: Private residence Living Arrangements: Alone Available Help at Discharge: Family;Available PRN/intermittently Type of Home: Apartment Home Access: Level entry     Home Layout: One level     Bathroom Shower/Tub: Chief Strategy Officer: Standard     Home Equipment: Rollator (4 wheels);Rolling Walker (2 wheels);Cane - single point;Wheelchair - manual;Toilet riser   Additional Comments: Conflicting information from chart review and hx provided last week. DIL manages medications, provides transportation and check on patient 2x/day      Prior Functioning/Environment Prior Level of Function : Needs assist;History of Falls (last six months)              Mobility Comments: intermittent RW use with falls ADLs Comments: Indep with ADLs, sponge bathes, DIL assists with  IADLs and transportation        OT Problem List: Decreased strength;Decreased activity tolerance;Decreased range of motion;Impaired balance (sitting and/or standing);Decreased cognition;Decreased safety awareness;Decreased knowledge of use of DME or AE;Decreased knowledge of precautions;Cardiopulmonary status limiting activity;Pain      OT Treatment/Interventions: Self-care/ADL training;Therapeutic exercise;Energy conservation;DME and/or AE instruction;Cognitive remediation/compensation;Therapeutic activities;Balance training;Patient/family education    OT Goals(Current goals can be found in the care plan section) Acute Rehab OT Goals Patient Stated Goal: to lay down OT Goal Formulation: With patient Time For Goal Achievement: 01/13/23 Potential to Achieve Goals: Fair  OT Frequency: Min 2X/week    Co-evaluation              AM-PAC OT "6 Clicks" Daily Activity     Outcome Measure Help from another person eating meals?: A Little Help from another person taking care of personal grooming?: A Little Help from another person toileting, which includes using toliet, bedpan, or urinal?: A Lot Help from another person bathing (including washing, rinsing, drying)?: A Lot Help from another person to put on and taking off regular upper body clothing?: A Little Help from another person to put on and taking off regular lower body clothing?: A Lot 6 Click Score: 15   End of Session Equipment Utilized During Treatment: Rolling walker (2 wheels);Oxygen Nurse Communication: Mobility status  Activity Tolerance: Treatment limited secondary to agitation Patient left: in bed;with call bell/phone within reach;with bed alarm set  OT Visit Diagnosis: Unsteadiness on feet (R26.81);Muscle weakness (generalized) (M62.81);History of falling (Z91.81);Pain                Time:  6433-2951 OT Time Calculation (min): 14 min Charges:     Sherley Bounds, OTS Acute Rehabilitation Services Office [phone] Secure Chat Communication Preferred   Sherley Bounds 12/30/2022, 10:59 AM

## 2022-12-30 NOTE — Progress Notes (Signed)
NAME:  Matthew Zamora, MRN:  [id number], DOB:  Dec 06, 1940, LOS: 2 ADMISSION DATE:  12/27/2022, CONSULTATION DATE: 7/14 REFERRING MD:  Clide Dales, CHIEF COMPLAINT:   MRSA bacteremia with septic shock And acute left internal carotid artery thrombus   History of Present Illness:  82 year old male patient who presented to the emergency room at a [facility] with acute onset of shortness of breath.  Denied any chest pain, no change in medications.  After diagnostic evaluation in the ER he was initially admitted with working diagnosis of acute on chronic heart failure with associated respiratory failure complicated further by atrial fibrillation with RVR .  He was treated with IV diuresis as well as calcium channel blockade course further complicated later following admission by hypotension requiring fluid resuscitation on 7/13 he continued to have atrial fibrillation with RVR he was started on amiodarone infusion and cardiology was consulted.  He also spiked fever at which time cultures and respiratory viral panel were sent and he was empirically started on cefepime and vancomycin.  He was moved to the intensive care.  Amiodarone later placed on hold due to hypotension with resuscitation focus changed to volume resuscitation.  BC ID panel was positive for Staph aureus/MRSA from blood cultures.  Respiratory viral panel positive for rhinovirus.  On 7/14 a code stroke was called around 8:30 in the morning when he developed sudden onset of right-sided weakness and aphasia as well as some agitation.  A stat CT angiogram was obtained this showed nearly occlusive ICA thrombus on the left.  Neurology and vascular surgery were consulted he was started on norepinephrine infusion to maintain mean arterial blood pressure and transferred to [facility] for neuro interventional radiology intervention critical care asked to assume primary care  Significant Hospital Events: Including procedures, antibiotic start and stop dates in  addition to other pertinent events   7/12 admitted [facility] with shortness of breath and initially a working diagnosis of acute decompensated heart failure 7/13 new fevers.  Blood cultures positive for MRSA antibiotic started, Vanco and cefepime hypotensive.  Having trouble with atrial fibrillation with RVR initially started on amiodarone which was subsequently discontinued 7/14 acute neurological decompensation.  CT angiogram showed partially occlusive thrombus in the left ICA.  Transferred to [facility] for neuro interventional radiology consultation and treatment.  Echocardiogram shows global hypokinesis worse in the septum LVEF down to 30 to 35% left ventricular hypertrophy diastolic parameters indeterminate RV size normal no mention of vegetation. Transferred to [facility] where he underwent  complete revascularization of the  proximal left internal carotid artery with 1 pass with a 6 mm x 47 embotrap  retrieval device with contact aspiration, and subsequent stent assisted angioplasty placed on Cangrelor for 4 hours. Transferred to ICU post op   Interim History / Subjective:  Overnight: Patient had multiple runs of sustained VT.  I had amiodarone bolus administered.  Patient also had magnesium and potassium repleted.  Did have EKG as well as from tachycardia at 132.  Patient evaluated bedside this morning.  He states he would like to go home he denies any concerns this morning.  He is slightly agitated.  CTA head negative for large vessel occlusion but positive for bulky, and elongated thrombus within the left ICA beginning at distal bulb.  Patient had thrombectomy via IR.  As well as stent placement at left ICA.  Objective   Blood pressure 113/70, pulse 92, temperature 97.9 F (36.6 C), temperature source Axillary, resp. rate 12, weight 70.5 kg, SpO2 100%.  Temperature 97.2 F - 100.3 F, pulse currently 90s, overnight 130s, respirations 12-18, on O2 100% on 2 L nasal cannula, blood pressure MAP 70-82 on  7-8 of Levophed        Intake/Output Summary (Last 24 hours) at 12/30/2022 1610 Last data filed at 12/30/2022 0600 Gross per 24 hour  Intake 3667.7 ml  Output 770 ml  Net 2897.7 ml   Filed Weights   12/29/22 1645 12/30/22 0500  Weight: 67.2 kg 70.5 kg    Examination: General: Chronically ill, restless, agitated HENT: Normocephalic, atraumatic, PERRLA, EOMI Lungs: Clear to auscultation bilaterally Cardiovascular: Irregularly irregular rhythm Abdomen: soft not tender  Extremities: Trace edema bilaterally Neuro: Able to follow instructions, decreased strength noted to right upper and lower extremities.  No focal neurological deficits  Labs reviewed: Magnesium 2.1 Lipid panel: Total cholesterol 103, triglycerides 116, HDL 23, LDL 57 BMP: Sodium 132, bicarb 18, glucose 121, creatinine 1.73, Phosphorus: 2.9 DIC panel: PT 17.4, INR 1.4, APTT 50, fibrinogen 535, D-dimer 0.72, platelets 92 CBC: White count 9.0, hemoglobin 7.4, platelet 89 Glucose: 96-117 Troponin 38, previous 38  Blood cultures: MRSA  CT head without contrast: Trace subarachnoid hyperdense material in the left frontal and parietal lobe near vertex, which is new from prior exam, could represent subarachnoid hemorrhage versus contrast staining from recent intervention.  Echocardiogram: Global hypokinesis worsening septum, left ventricular ejection fraction 30 to 35% same as previous.  Patient also has some mild mitral regurg.  No vegetations seen.  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  #Left ICA thrombus, s/p thrombectomy and left ICA stent placement Patient evaluated bedside this morning.  Patient still  has decreased right-sided weakness.  Patient is speaking, but still having dysarthria.  Patient did have CT head that could represent subarachnoid hemorrhage, would benefit from repeat CT head.  Supposed to be on aspirin and statin, but holding given NPO.  Etiology secondary to either septic emboli from underlying bacteremia versus atrial fibrillation.  Will need to hold antiplatelets and anticoagulation in the setting of suspected CT findings. -Speech evaluation today -PT/OT following -Frequent neurochecks -Neurology following -Will need to hold antiplatelet and DOAC until neurology can resume -New CT findings suggestive of subarachnoid hemorrhage -Blood pressure goal systolics between 120-160 -Will need to reach out to cards for TEE -Atorvastatin 80 mg daily once speech clears -Resume aspirin and Brilinta -Repeat CT tomorrow  #MRSA bacteremia #Septic shock Unclear source of infection.  Currently requiring Levophed.  TTE was negative, but will likely need TEE.  Currently on vancomycin and cefepime day 2.  Patient does have history of chronic hypotension on midodrine and Mestinon for this.  Continue antibiotics.  ID following -Wean Levophed to keep MAP greater than 65 -Patient euvolemic -ID following -Follow cultures and sensitivities -Currently on vancomycin day 2 -Reach out to cardiology for TEE   #Combined systolic and diastolic heart failure #Elevated troponin No acute concern for exacerbation.  No need for diuretics as patient seems euvolemic on exam. -Hold diuresing -TTE showing reduced ejection fraction of 30 to 35% with global hypokinesis -Holding home Lipitor 80 mg daily -Holding home metoprolol tartrate 37.5 mg twice daily -Will likely need to titrate up GDMT medications -Follow-up TEE   #  Atrial fibrillation with RVR Currently on amiodarone.  Rates into the 110s -150s.  Overnight did have runs of V. tach. -Blood pressure would not tolerate beta-blocker or calcium channel  blocker, patient is on home metoprolol tartrate -Given underlying etiology of stroke, will need to hold DOAC and anticoagulation  #Acute metabolic encephalopathy secondary to sepsis Likely secondary to sepsis.  Complicated with ICU delirium.  Patient is hard to reorient at times.  Wanting to go home. -Reorient patient -ICU delirium precautions -Antibiotic treatment as above -On Precedex    #Acute on chronic CKD stage IIIb #NAGMA #Hyponatremia Likely secondary to ischemic ATN.  Bicarb down to 18.  This is likely secondary to underlying renal insufficiency.  Baseline creatinine around 1.3-1.4.  Currently at 1.73. -Monitor BMP -Monitor creatinine -Monitor urine output   #Normocytic anemia, likely anemia of chronic disease Hemoglobin 7.4.  All cell lines did decrease, likely dilutional.  No evidence of bleeding. -Multifactorial likely secondary to chronic illness and critical illness   #Thrombocytopenia Likely sepsis related.  No acute concern for bleed at this time. -Monitor platelet count  #GERD -On home Protonix 40 mg twice daily -Continue home Carafate  #Chronic hypotension -Continue home midodrine 30 mg 3 times daily -Continue home Mestinon 60 mg every 8 hours  #Depression/anxiety -Resume home sertraline 25 mg daily    Best Practice (right click and "Reselect all SmartList Selections" daily)   Diet/type: NPO DVT prophylaxis: SCD GI prophylaxis: N/A Lines: N/A Foley:  N/A Code Status:  full code Last date of multidisciplinary goals of care discussion [pending]  Labs   CBC: Recent Labs  Lab 12/27/22 1216 12/28/22 0409 12/29/22 0836 12/30/22 0015 12/30/22 0322  WBC 10.9* 20.6* 12.9*  --  9.0  HGB 9.7* 8.7* 8.4* 12.9* 7.4*  HCT 31.5* 26.8* 26.2* 38.0* 22.7*  MCV 94.9 91.8 92.6  --  92.3  PLT 178 141* 109*  --  92*  89*    Basic Metabolic Panel: Recent Labs  Lab 12/27/22 1216 12/28/22 0409 12/29/22 0836 12/29/22 2233 12/30/22 0015 12/30/22 0322  NA  138 134* 132* 137 136 132*  K 4.2 4.1 3.7 3.7 3.5 3.8  CL 105 103 102 105  --  106  CO2 25 24 21* 20*  --  18*  GLUCOSE 108* 102* 97 91  --  121*  BUN 19 29* 39* 33*  --  31*  CREATININE 1.54* 1.76* 2.12* 1.86*  --  1.73*  CALCIUM 9.4 8.4* 7.7* 8.1*  --  7.9*  MG 1.9  --   --  1.6*  --  2.1  PHOS  --   --   --  3.0  --  2.9   GFR: Estimated Creatinine Clearance: 32.9 mL/min (A) (by C-G formula based on SCr of 1.73 mg/dL (H)). Recent Labs  Lab 12/27/22 1216 12/28/22 0409 12/28/22 1340 12/29/22 0836 12/30/22 0322  PROCALCITON  --   --   --  5.58  --   WBC 10.9* 20.6*  --  12.9* 9.0  LATICACIDVEN  --   --  1.6  --   --     Liver Function Tests: Recent Labs  Lab 12/27/22 1216 12/28/22 0409 12/29/22 0836  AST 25 22 28   ALT 25 21 20   ALKPHOS 81 66 53  BILITOT 0.6 0.9 0.9  PROT 6.2* 5.7* 5.1*  ALBUMIN 3.5 3.3* 2.8*   No results for input(s): "LIPASE", "AMYLASE" in the last 168 hours. No results for input(s): "AMMONIA" in the last 168 hours.  ABG    Component Value Date/Time   PHART 7.339 (L) 12/30/2022 0015   PCO2ART 35.1 12/30/2022 0015   PO2ART 133 (H) 12/30/2022 0015   HCO3 18.8 (L) 12/30/2022 0015   TCO2 20 (L) 12/30/2022 0015   ACIDBASEDEF 6.0 (H) 12/30/2022 0015   O2SAT 99 12/30/2022 0015     Coagulation Profile: Recent Labs  Lab 12/30/22 0322  INR 1.4*    Cardiac Enzymes: No results for input(s): "CKTOTAL", "CKMB", "CKMBINDEX", "TROPONINI" in the last 168 hours.  HbA1C: Hgb A1c MFr Bld  Date/Time Value Ref Range Status  12/29/2022 06:24 PM 5.4 4.8 - 5.6 % Final    Comment:    (NOTE) Pre diabetes:          5.7%-6.4%  Diabetes:              >6.4%  Glycemic control for   <7.0% adults with diabetes   06/12/2021 06:00 PM 5.1 4.8 - 5.6 % Final    Comment:    (NOTE) Pre diabetes:          5.7%-6.4%  Diabetes:              >6.4%  Glycemic control for   <7.0% adults with diabetes     CBG: Recent Labs  Lab 12/29/22 0848 12/29/22 1922  12/29/22 2236 12/29/22 2314 12/30/22 0319  GLUCAP 93 77 96 103* 117*    Review of Systems:   Not able   Past Medical History:  He,  has a past medical history of Abdominal aortic aneurysm (AAA) ([name]) (05/13/15), Adenomatous colon polyp (03/18/2001, 03/14/2009, 10/06/2014), Anemia, 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 pylori infection, History of hepatitis, Hyperlipidemia, Hypertension, Liver cyst (05/16/15), PAF (paroxysmal atrial fibrillation) ([name]), and Prostate CA ([name]).   Surgical History:   Past Surgical History:  Procedure Laterality Date   CATARACT EXTRACTION     COLONOSCOPY  10/06/2014, 09/18/2004, 03/14/2009   ESOPHAGOGASTRODUODENOSCOPY  10/06/2014, 03/18/2001, 03/14/2009   ESOPHAGOGASTRODUODENOSCOPY (EGD) WITH PROPOFOL N/A 05/07/2018   Procedure: ESOPHAGOGASTRODUODENOSCOPY (EGD) WITH PROPOFOL;  Surgeon: Toledo, Boykin Nearing, MD;  Location: [facility] ENDOSCOPY;  Service: Gastroenterology;  Laterality: N/A;   ESOPHAGOGASTRODUODENOSCOPY (EGD) WITH PROPOFOL N/A 04/22/2022   Procedure: ESOPHAGOGASTRODUODENOSCOPY (EGD) WITH PROPOFOL;  Surgeon: Toney Reil, MD;  Location: [facility] ENDOSCOPY;  Service: Endoscopy;  Laterality: N/A;   FLEXIBLE SIGMOIDOSCOPY  08/26/1990   HIP ARTHROPLASTY Right 10/31/2022   Procedure: ARTHROPLASTY BIPOLAR HIP (HEMIARTHROPLASTY)-RNFA;  Surgeon: Reinaldo Berber, MD;  Location: [facility] ORS;  Service: Orthopedics;  Laterality: Right;   INSERTION OF ICD     PROSTATE SURGERY     TONSILLECTOMY       Social History:   reports that he quit smoking about 23 years ago. His smoking use included cigarettes. He started smoking about 61 years ago. He has a 38 pack-year smoking history. He has never used smokeless tobacco. He reports that he does not currently use drugs. He reports that he does not drink alcohol.   Family  History:  His family history includes Heart attack in his father and mother.   Allergies Allergies  Allergen Reactions   Nitrofuran Derivatives Other (See Comments)    Transaminitis ** confounded w/Amiodarone, Mexiletine   Spironolactone     Significant transaminitis      Home Medications  Prior to Admission medications   Medication Sig Start Date End Date Taking? Authorizing Provider  acetaminophen (TYLENOL) 325 MG tablet  Take 2 tablets (650 mg total) by mouth every 4 (four) hours as needed for headache or mild pain. 10/29/19   Judithe Modest, NP  albuterol (PROVENTIL) (2.5 MG/3ML) 0.083% nebulizer solution Inhale 3 mLs (2.5 mg total) into the lungs as needed for shortness of breath. 10/29/19   Judithe Modest, NP  ascorbic acid (VITAMIN C) 500 MG tablet Take 500 mg by mouth daily.    [provider]  atorvastatin (LIPITOR) 80 MG tablet Take 80 mg by mouth daily. 05/13/21   [provider]  calcium carbonate (TUMS EX) 750 MG chewable tablet Chew 1 tablet by mouth daily.    [provider]  ELIQUIS 2.5 MG TABS tablet Take 2.5 mg by mouth 2 (two) times daily. 12/28/21   [provider]  fluticasone-salmeterol (ADVAIR) 500-50 MCG/ACT AEPB Inhale 1 puff into the lungs in the morning and at bedtime. 04/05/22   [provider]  gabapentin (NEURONTIN) 100 MG capsule Take 100 mg by mouth at bedtime. 12/09/19   [provider]  guaiFENesin-dextromethorphan (ROBITUSSIN DM) 100-10 MG/5ML syrup Take 5 mLs by mouth every 4 (four) hours as needed for cough. 11/18/22   Enedina Finner, MD  magnesium oxide (MAG-OX) 400 (240 Mg) MG tablet Take 1 tablet by mouth daily. 02/05/21   [provider]  melatonin 3 MG TABS tablet Take 9 mg by mouth at bedtime.    [provider]  metoCLOPramide (REGLAN) 5 MG tablet Take 1 tablet (5 mg total) by mouth every 8 (eight) hours as needed for refractory nausea / vomiting. 05/13/22   Delfino Lovett, MD   metoprolol tartrate 37.5 MG TABS Take 1 tablet (37.5 mg total) by mouth 2 (two) times daily. 12/20/22 01/19/23  Lucile Shutters, MD  midodrine (PROAMATINE) 5 MG tablet Take 1 tablet (5 mg total) by mouth 3 (three) times daily with meals. Patient taking differently: Take 30 mg by mouth 3 (three) times daily with meals. 11/18/22   Enedina Finner, MD  Multiple Vitamin (MULTIVITAMIN WITH MINERALS) TABS tablet Take 1 tablet by mouth daily. 10/29/19   Judithe Modest, NP  Nebulizer MISC 1 each by Does not apply route as needed. 06/16/21   Rolly Salter, MD  Nutritional Supplements (,FEEDING SUPPLEMENT, PROSOURCE PLUS) liquid Take 30 mLs by mouth 3 (three) times daily between meals. 05/13/22   Delfino Lovett, MD  ondansetron (ZOFRAN-ODT) 4 MG disintegrating tablet Take 4 mg by mouth every 8 (eight) hours as needed for refractory nausea / vomiting, vomiting or nausea.    [provider]  pantoprazole (PROTONIX) 40 MG tablet Take 1 tablet (40 mg total) by mouth 2 (two) times daily. 05/13/22   Delfino Lovett, MD  polyethylene glycol (MIRALAX / GLYCOLAX) 17 g packet Take 17 g by mouth 2 (two) times daily. 11/07/22   Leeroy Bock, MD  pyridostigmine (MESTINON) 60 MG tablet Take 1 tablet (60 mg total) by mouth every 8 (eight) hours. 05/13/22   Delfino Lovett, MD  sertraline (ZOLOFT) 25 MG tablet Take 25 mg by mouth daily.    [provider]  sucralfate (CARAFATE) 1 GM/10ML suspension Take 10 mLs (1 g total) by mouth 4 (four) times daily -  with meals and at bedtime. 05/13/22   Delfino Lovett, MD  tamsulosin (FLOMAX) 0.4 MG CAPS capsule Take 0.4 mg by mouth daily. 12/09/19   [provider]     Critical care time: 35 mins    Modena Slater, DO Internal Medicine Resident PGY-2 [phone] Pager #  [phone] OR # [phone] if no answer

## 2022-12-30 NOTE — Progress Notes (Signed)
Pharmacy Antibiotic Note  Matthew Zamora is a 82 y.o. male admitted on 12/29/2022 with bacteremia.  He was initially started on vancomycin and cefepime at [facility]. Pharmacy has been consulted to re-initiate vancomycin for MRSA bacteremia.   Renal function improving, afebrile, WBC normalized.  Plan: Dose vancomycin for target trough level 15-20 mg due to potential for CNS/meningitis involvement. Scr trending down to 1.54 today (baseline 1.3-1.4). Increase vancomycin dose to 1000mg  IV Q24H. Monitor renal fxn, repeat BCx, and vanc levels as indicated.  Weight: 70.5 kg (155 lb 6.8 oz)  Temp (24hrs), Avg:98.3 F (36.8 C), Min:97.2 F (36.2 C), Max:100.3 F (37.9 C)  Recent Labs  Lab 12/27/22 1216 12/28/22 0409 12/28/22 1340 12/29/22 0836 12/29/22 1824 12/29/22 2233 12/30/22 0322  WBC 10.9* 20.6*  --  12.9*  --   --  9.0  CREATININE 1.54* 1.76*  --  2.12*  --  1.86* 1.73*  LATICACIDVEN  --   --  1.6  --   --   --   --   VANCORANDOM  --   --   --   --  10  --   --     Estimated Creatinine Clearance: 32.9 mL/min (A) (by C-G formula based on SCr of 1.73 mg/dL (H)).    Allergies  Allergen Reactions   Nitrofuran Derivatives Other (See Comments)    Transaminitis ** confounded w/Amiodarone, Mexiletine   Amiodarone Other (See Comments)    Unknown    Spironolactone Other (See Comments)    Significant transaminitis     Vanc 7/13, 7/14 >> Cefepime x1 7/13  7/13 Vanc 1500mg  at 1437 (SCr 1.76) 7/14 VR = 10 mcg/mL (30 hrs from loading dose) >> 1250mg  at 2100 (SCr 2.12)  7/13 BCx - MRSA  7/14 MRSA PCR - positive  Lendon Ka, PharmD Candidate 12/30/2022, 2:19 PM

## 2022-12-30 NOTE — Progress Notes (Addendum)
   Palliative Medicine Inpatient Follow Up Note HPI: 82 year old man with myriad of comorobidities including refractory orthostatic hypotension, HFrEF, chronic N/V who presented w/ SOB at [facility] acteremic with MRSA as well has + for rhinovirus. (+( R sided weakness w/ L ICA thrombus transferred to [facility] for emergent thrombectomy.    Palliative care has been asked to get involved in the setting of multiple readmissions, high chronic disease burden, and declined health to further discuss goals of care.   Today's Discussion 12/30/2022  *Please note that this is a verbal dictation therefore any spelling or grammatical errors are due to the "[employer] One" system interpretation.  Chart reviewed inclusive of vital signs, progress notes, laboratory results, and diagnostic images.   I met with Matthew Zamora at bedside this morning. He was more awake and alert today. He was aware of who he was and knew he was in the hospital though did not know why. We reviewed the events that led to his admission and thrombectomy. He was able to participate in conversation and follow commands. He was able to move all four extremities. He denies pain, nausea, or shortness of breath.   Created space and opportunity for patient to explore thoughts feelings and fears regarding current medical situation. He shares he is most hopeful to get food this morning. He would like to drink. We discussed that he will first need for speech therapy to evaluate him.   Matthew Zamora shares he wants to "get better" at this time. He would like efforts made to promote this. Though he intermittently seemed to get confused when stating his wishes.   I plan to call family this morning.   Questions and concerns addressed/Palliative Support Provided.   Objective Assessment: Vital Signs Vitals:   12/30/22 1000 12/30/22 1015  BP:    Pulse: (!) 123 (!) 123  Resp: 19 (!) 24  Temp:    SpO2: 100% 100%    Intake/Output Summary (Last 24 hours) at  12/30/2022 1032 Last data filed at 12/30/2022 1000 Gross per 24 hour  Intake 4113.22 ml  Output 770 ml  Net 3343.22 ml   Last Weight  Most recent update: 12/30/2022  5:51 AM    Weight  70.5 kg (155 lb 6.8 oz)            Gen:  Elderly Caucasian M chronically ill appearing HEENT: moist mucous membranes CV: Irregular rate and rhythm  PULM:  On 2LPM [place], breathing is even and nonlabored ABD: soft/nontender  EXT: No edema  Neuro: Aware of who he is and where he is though not understanding why  SUMMARY OF RECOMMENDATIONS   Full Code / Full Scope of Care    Allow time for outcomes   Delirium precautions  PT/OT/Speech    Ongoing PMT support  Billing based on MDM: High ______________________________________________________________________________________ Lamarr Lulas [employer] Palliative Medicine Team Team Cell Phone: [phone] Please utilize secure chat with additional questions, if there is no response within 30 minutes please call the above phone number  Palliative Medicine Team providers are available by phone from 7am to 7pm daily and can be reached through the team cell phone.  Should this patient require assistance outside of these hours, please call the patient's attending physician.

## 2022-12-30 NOTE — Significant Event (Signed)
At approximately 0330 on 12/29/2022 patient had a brief episode of confusion and word salad. No weakness on either side or facial droop notified Dr. Arville Care, and called a code stroke. Dr. Arville Care came to bedside while I was checking the patient's CBG, CBG was 72 Dr. Arville Care stated to cancel code stroke, because he felt that the symptoms were caused by hypoglycemia. Once patient started calming down I reached back out to Dr. Arville Care to see if we could take patient to CT, Dr. Arville Care declined to order the CT, stating that symptoms were cause by hypoglycemia.

## 2022-12-30 NOTE — Consult Note (Signed)
[facility]       Reason for Consult:bacteremia    Referring Physician: Dr. 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, left ([name])    aspirin  81 mg Oral Daily   Or   aspirin  81 mg Per Tube Daily   atorvastatin  80 mg Oral QHS   Chlorhexidine Gluconate Cloth  6 each Topical Daily   diclofenac Sodium  4 g Topical QID   gabapentin  100 mg Oral QHS   melatonin  9 mg Oral QHS   midodrine  30 mg Oral TID WC   multivitamin with minerals  1 tablet Oral Daily   pantoprazole  40 mg Oral BID   potassium chloride  20 mEq Oral Once   pyridostigmine  60 mg Oral Q8H   sertraline  25 mg Oral Daily   sucralfate  1 g Oral TID WC & HS   ticagrelor  90 mg Oral BID   Or   ticagrelor  90 mg Per Tube BID    Recommendations: Continue vancomycin  Repeat blood cultures TEE (has been requested)  Assessment: 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    Rhinovirus   HPI: Matthew Zamora is a 82 y.o. male with multiple medical problems presented to [facility] with shortness of breath.  + rhinovirus and also MRSA bacteremia in 4/4 bottles.  Pacemaker in place.  Left ICA thrombus and transferred to [facility] for evaluation.  Underwent angioplasty by IR.  Tmax 103.3, WBC 20.6 initially.    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 pylori infection    History of hepatitis    Hyperlipidemia    Hypertension    Liver cyst 05/16/15   PAF  (paroxysmal atrial fibrillation) ([name])    Prostate CA ([name])     Social History   Tobacco Use   Smoking status: Former    Current packs/day: 0.00    Average packs/day: 1 pack/day for 38.0 years (38.0 ttl pk-yrs)    Types: Cigarettes    Start date: 06/17/1961    Quit date: 06/18/1999    Years since quitting: 23.5   Smokeless tobacco: Never  Vaping Use   Vaping status: Never Used  Substance Use Topics   Alcohol use: No    Alcohol/week: 0.0 standard drinks of alcohol   Drug use: Not Currently    Family History  Problem Relation Age of Onset   Heart attack Mother    Heart attack Father     Allergies  Allergen Reactions   Nitrofuran Derivatives Other (See Comments)    Transaminitis ** confounded w/Amiodarone, Mexiletine   Spironolactone     Significant transaminitis     Physical Exam: Constitutional: in no apparent distress  Vitals:   12/30/22 1000 12/30/22 1015  BP:    Pulse: (!) 123 (!) 123  Resp: 19 (!) 24  Temp:    SpO2: 100% 100%  EYES: anicteric Respiratory: normal respiratory effort GI: soft Musculoskeletal: no edema  Lab Results  Component Value Date   WBC 9.0 12/30/2022   HGB 7.4 (L) 12/30/2022   HCT 22.7 (L) 12/30/2022   MCV 92.3 12/30/2022   PLT 89 (L) 12/30/2022   PLT 92 (L) 12/30/2022    Lab Results  Component Value Date   CREATININE 1.73 (H) 12/30/2022   BUN 31 (H) 12/30/2022   NA 132 (L) 12/30/2022   K 3.8 12/30/2022   CL 106 12/30/2022   CO2 18 (L) 12/30/2022    Lab Results  Component Value Date   ALT 20 12/29/2022   AST 28 12/29/2022   ALKPHOS 53 12/29/2022     Microbiology: Recent Results (from the past 240 hour(s))  MRSA Next Gen by PCR, Nasal     Status: Abnormal   Collection Time: 12/27/22  9:38 PM   Specimen: Nasal Mucosa; Nasal Swab  Result Value Ref Range Status   MRSA by PCR Next Gen DETECTED (A) NOT DETECTED Final    Comment: CRITICAL RESULT CALLED TO, READ BACK BY AND VERIFIED WITH: MELISSA COBB RN @ [phone] 12/28/22  [facility] (NOTE) The GeneXpert MRSA Assay (FDA approved for NASAL specimens only), is one component of a comprehensive MRSA colonization surveillance program. It is not intended to diagnose MRSA infection nor to guide or monitor treatment for MRSA infections. Test performance is not FDA approved in patients less than 25 years old. Performed at [facility], [address] [place], [place] [address]   Culture, blood (Routine X 2) w Reflex to ID Panel     Status: None (Preliminary result)   Collection Time: 12/28/22 10:47 AM   Specimen: BLOOD RIGHT HAND  Result Value Ref Range Status   Specimen Description BLOOD RIGHT HAND  Final   Special Requests   Final    BOTTLES DRAWN AEROBIC AND ANAEROBIC Blood Culture adequate volume   Culture  Setup Time   Final    GRAM POSITIVE COCCI IN BOTH AEROBIC AND ANAEROBIC BOTTLES CRITICAL RESULT CALLED TO, READ BACK BY AND VERIFIED WITH: TREY GREENWOOD @ 2146 12/28/22 LFD Performed at [facility], [address] [place], [place] [address]    Culture GRAM POSITIVE COCCI  Final   Report Status PENDING  Incomplete  Culture, blood (Routine X 2) w Reflex to ID Panel     Status: Abnormal (Preliminary result)   Collection Time: 12/28/22 10:48 AM   Specimen: BLOOD LEFT HAND  Result Value Ref Range Status   Specimen Description   Final    BLOOD LEFT HAND Performed at [facility], [address], [place], [place] [address]    Special Requests   Final    BOTTLES DRAWN AEROBIC AND ANAEROBIC Blood Culture adequate volume Performed at [facility], [address] [place], [place] [address]    Culture  Setup Time   Final    GRAM POSITIVE COCCI IN BOTH AEROBIC AND ANAEROBIC BOTTLES Organism ID to follow CRITICAL RESULT CALLED TO, READ BACK BY AND VERIFIED WITH: TREY GREENWOOD @2146  12/28/22 LFD Performed at [facility], [address] [place] [address], [place], [place] [address]    Culture (A)  Final    STAPHYLOCOCCUS  AUREUS SUSCEPTIBILITIES TO FOLLOW Performed at [facility], [address], [place], [place] [address]    Report Status PENDING  Incomplete  Blood Culture ID Panel (Reflexed)     Status: Abnormal   Collection Time: 12/28/22 10:48 AM  Result  Value Ref Range Status   Enterococcus faecalis NOT DETECTED NOT DETECTED Final   Enterococcus Faecium NOT DETECTED NOT DETECTED Final   Listeria monocytogenes NOT DETECTED NOT DETECTED Final   Staphylococcus species DETECTED (A) NOT DETECTED Final    Comment: CRITICAL RESULT CALLED TO, READ BACK BY AND VERIFIED WITH: TREY GREENWOOD @ 2146 12/28/22 LFD    Staphylococcus aureus (BCID) DETECTED (A) NOT DETECTED Final    Comment: Methicillin (oxacillin)-resistant Staphylococcus aureus (MRSA). MRSA is predictably resistant to beta-lactam antibiotics (except ceftaroline). Preferred therapy is vancomycin unless clinically contraindicated. Patient requires contact precautions if  hospitalized. CRITICAL RESULT CALLED TO, READ BACK BY AND VERIFIED WITH: TREY GREENWOOD @ 2146 12/28/22 LFD    Staphylococcus epidermidis NOT DETECTED NOT DETECTED Final   Staphylococcus lugdunensis NOT DETECTED NOT DETECTED Final   Streptococcus species NOT DETECTED NOT DETECTED Final   Streptococcus agalactiae NOT DETECTED NOT DETECTED Final   Streptococcus pneumoniae NOT DETECTED NOT DETECTED Final   Streptococcus pyogenes NOT DETECTED NOT DETECTED Final   A.calcoaceticus-baumannii NOT DETECTED NOT DETECTED Final   Bacteroides fragilis NOT DETECTED NOT DETECTED Final   Enterobacterales NOT DETECTED NOT DETECTED Final   Enterobacter cloacae complex NOT DETECTED NOT DETECTED Final   Escherichia coli NOT DETECTED NOT DETECTED Final   Klebsiella aerogenes NOT DETECTED NOT DETECTED Final   Klebsiella oxytoca NOT DETECTED NOT DETECTED Final   Klebsiella pneumoniae NOT DETECTED NOT DETECTED Final   Proteus species NOT DETECTED NOT DETECTED Final   Salmonella species NOT  DETECTED NOT DETECTED Final   Serratia marcescens NOT DETECTED NOT DETECTED Final   Haemophilus influenzae NOT DETECTED NOT DETECTED Final   Neisseria meningitidis NOT DETECTED NOT DETECTED Final   Pseudomonas aeruginosa NOT DETECTED NOT DETECTED Final   Stenotrophomonas maltophilia NOT DETECTED NOT DETECTED Final   Candida albicans NOT DETECTED NOT DETECTED Final   Candida auris NOT DETECTED NOT DETECTED Final   Candida glabrata NOT DETECTED NOT DETECTED Final   Candida krusei NOT DETECTED NOT DETECTED Final   Candida parapsilosis NOT DETECTED NOT DETECTED Final   Candida tropicalis NOT DETECTED NOT DETECTED Final   Cryptococcus neoformans/gattii NOT DETECTED NOT DETECTED Final   Meth resistant mecA/C and MREJ DETECTED (A) NOT DETECTED Final    Comment: CRITICAL RESULT CALLED TO, READ BACK BY AND VERIFIED WITH: TREY GREENWOOD @ 2146 12/28/22 LFD Performed at [facility], [address] [place], [place] [address]   Respiratory (~20 pathogens) panel by PCR     Status: None   Collection Time: 12/28/22 11:55 AM   Specimen: Nasopharyngeal Swab; Respiratory  Result Value Ref Range Status   Adenovirus NOT DETECTED NOT DETECTED Final   Coronavirus 229E NOT DETECTED NOT DETECTED Final    Comment: (NOTE) The Coronavirus on the Respiratory Panel, DOES NOT test for the novel  Coronavirus (2019 nCoV)    Coronavirus HKU1 NOT DETECTED NOT DETECTED Final   Coronavirus NL63 NOT DETECTED NOT DETECTED Final   Coronavirus OC43 NOT DETECTED NOT DETECTED Final   Metapneumovirus NOT DETECTED NOT DETECTED Final   Rhinovirus / Enterovirus NOT DETECTED NOT DETECTED Final   Influenza A NOT DETECTED NOT DETECTED Final   Influenza B NOT DETECTED NOT DETECTED Final   Parainfluenza Virus 1 NOT DETECTED NOT DETECTED Final   Parainfluenza Virus 2 NOT DETECTED NOT DETECTED Final   Parainfluenza Virus 3 NOT DETECTED NOT DETECTED Final   Parainfluenza Virus 4 NOT DETECTED NOT DETECTED Final    Respiratory Syncytial Virus NOT DETECTED NOT  DETECTED Final   Bordetella pertussis NOT DETECTED NOT DETECTED Final   Bordetella Parapertussis NOT DETECTED NOT DETECTED Final   Chlamydophila pneumoniae NOT DETECTED NOT DETECTED Final   Mycoplasma pneumoniae NOT DETECTED NOT DETECTED Final    Comment: Performed at [facility], [address], [place], [place] [address]  SARS Coronavirus 2 by RT PCR (hospital order, performed in [facility] hospital lab) *cepheid single result test* Anterior Nasal Swab     Status: None   Collection Time: 12/29/22  1:06 PM   Specimen: Anterior Nasal Swab  Result Value Ref Range Status   SARS Coronavirus 2 by RT PCR NEGATIVE NEGATIVE Final    Comment: Performed at [facility], [address] [place], [place] [address]  MRSA Next Gen by PCR, Nasal     Status: Abnormal   Collection Time: 12/29/22  6:50 PM   Specimen: Nasal Mucosa; Nasal Swab  Result Value Ref Range Status   MRSA by PCR Next Gen DETECTED (A) NOT DETECTED Final    Comment: RESULT CALLED TO, READ BACK BY AND VERIFIED WITH: S. SAVAGE  RN 12/29/22 @ 2126 BY [facility] (NOTE) The GeneXpert MRSA Assay (FDA approved for NASAL specimens only), is one component of a comprehensive MRSA colonization surveillance program. It is not intended to diagnose MRSA infection nor to guide or monitor treatment for MRSA infections. Test performance is not FDA approved in patients less than 58 years old. Performed at [facility], [address] [place], [place] [address]     Gardiner Barefoot, MD [facility] [email or web address] 12/30/2022, 10:46 AM

## 2022-12-30 NOTE — Evaluation (Signed)
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 pylori infection    History of hepatitis    Hyperlipidemia    Hypertension    Liver cyst 05/16/15   PAF (paroxysmal atrial fibrillation) ([name])    Prostate CA [facility])    Past Surgical History:  Past Surgical History:  Procedure Laterality Date   CATARACT EXTRACTION     COLONOSCOPY  10/06/2014, 09/18/2004, 03/14/2009   ESOPHAGOGASTRODUODENOSCOPY  10/06/2014, 03/18/2001, 03/14/2009   ESOPHAGOGASTRODUODENOSCOPY (EGD) WITH PROPOFOL N/A 05/07/2018   Procedure: ESOPHAGOGASTRODUODENOSCOPY (EGD) WITH PROPOFOL;  Surgeon: Toledo, Boykin Nearing, MD;  Location: [facility] ENDOSCOPY;  Service: Gastroenterology;  Laterality: N/A;   ESOPHAGOGASTRODUODENOSCOPY (EGD) WITH PROPOFOL N/A 04/22/2022   Procedure: ESOPHAGOGASTRODUODENOSCOPY (EGD) WITH PROPOFOL;  Surgeon: Toney Reil, MD;  Location: [facility] ENDOSCOPY;  Service: Endoscopy;  Laterality: N/A;   FLEXIBLE SIGMOIDOSCOPY  08/26/1990   HIP ARTHROPLASTY Right 10/31/2022   Procedure: ARTHROPLASTY BIPOLAR HIP (HEMIARTHROPLASTY)-RNFA;  Surgeon: Reinaldo Berber, MD;  Location: [facility] ORS;  Service: Orthopedics;  Laterality: Right;   INSERTION OF ICD     PROSTATE SURGERY     RADIOLOGY WITH ANESTHESIA N/A 12/29/2022   Procedure: IR WITH ANESTHESIA;  Surgeon: Radiologist, Medication, MD;  Location: [facility] OR;   Service: Radiology;  Laterality: N/A;   TONSILLECTOMY     HPI:  82 yo male admitted to [facility] 7/12 with SOB with decompensated HF. 7/13 hypotension with sepsis. 7/14 Rt weakness with code stroke and transfer to [facility], s/p Lt ICA thrombectomy with residual stenosis ultimately requiring left ICA stent placement.  Extubated post procedure; confusion. PMhx: COPD, CHF, AFib, CKD, HTN. Has had prior bedside swallow evaluations at [facility] but no significant dysphagia.    Assessment / Plan / Recommendation  Clinical Impression  Pt was irritable but willing to participate in brief swallowing assessment. Oral mechanism exam was normal.  Pt demonstrated thorough mastication of solids and no s/s of aspiration with consecutive sips of thin liquid nor mixed solid/liquid consistencies.  Recommend resuming a regular diet/thin liquids. Give meds in purees.  No SLP f/u is needed. D/W RN. SLP Visit Diagnosis: Dysphagia, unspecified (R13.10)    Aspiration Risk  No limitations    Diet Recommendation   Thin;Age appropriate regular  Medication Administration: Whole meds with puree    Other  Recommendations Oral Care Recommendations: Oral care BID    Recommendations for follow up therapy are one component of a multi-disciplinary discharge planning process, led by the attending physician.  Recommendations may be updated based on patient status, additional functional criteria and insurance authorization.  Follow up Recommendations No SLP follow up                                  Swallow Study   General Date of Onset: 12/29/22 HPI: 82 yo male  admitted to [facility] 7/12 with SOB with decompensated HF. 7/13 hypotension with sepsis. 7/14 Rt weakness with code stroke and transfer to [facility], s/p Lt ICA thrombectomy with residual stenosis ultimately requiring left ICA stent placement.  Extubated post procedure; confusion. PMhx: COPD, CHF, AFib, CKD, HTN. Has had prior bedside swallow evaluations at [facility] but no  significant dysphagia. Type of Study: Bedside Swallow Evaluation Previous Swallow Assessment: see hpi Diet Prior to this Study: NPO Temperature Spikes Noted: No Respiratory Status: Nasal cannula History of Recent Intubation: Yes Behavior/Cognition: Alert Oral Cavity Assessment: Within Functional Limits Oral Care Completed by SLP: Recent completion by staff Oral Cavity - Dentition: Adequate natural dentition Vision: Functional for self-feeding Self-Feeding Abilities: Able to feed self;Needs assist Patient Positioning: Upright in bed Baseline Vocal Quality: Hoarse Volitional Cough: Strong    Oral/Motor/Sensory Function Overall Oral Motor/Sensory Function: Within functional limits   Ice Chips Ice chips: Within functional limits   Thin Liquid Thin Liquid: Within functional limits    Nectar Thick Nectar Thick Liquid: Not tested   Honey Thick Honey Thick Liquid: Not tested   Puree Puree: Within functional limits   Solid     Solid: Within functional limits      Blenda Mounts Laurice 12/30/2022,10:47 AM  Marchelle Folks L. Samson Frederic, MA CCC/SLP Clinical Specialist - Acute Care SLP Acute Rehabilitation Services Office number [phone]

## 2022-12-30 NOTE — Progress Notes (Addendum)
STROKE TEAM PROGRESS NOTE   BRIEF HPI Mr. Matthew Zamora is a 82 y.o. male with history of O2 dep COPD, HTN, HLD, CAD (PCI > RCA 2019), ICM, ICD, VT, AFi  currently hospitalized with sepsis, found to have MRSA bacteremia.  Patient c/o neck pain in the setting of bacteremia, there could be a risk of meningitis. He is not safe to LP given his anticoagulation. Currently on aspirin and brilinta.   SIGNIFICANT HOSPITAL EVENTS 7/14: Code stroke activated overnight, then cancelled d/t resolution of symptoms. CTA showed non-occlusive thrombus in left ICA. Transferred to [facility] for emergent thrombectomy.  Status post stent assisted angioplasty of proximal Lt ICA stenosis with proximal and distal protection.  7/15: Levophed for hypotension. Repeat CT negative.   INTERIM HISTORY/SUBJECTIVE On exam today, patient alert, oriented, left facial droop, decreased right grip, questionable decreased vision on the right side unable to tell completely as patient is slightly uncooperative with full visual exam.  Patient continues to state that he wants to go home.  Family at bedside, updated.  OBJECTIVE  CBC    Component Value Date/Time   WBC 9.0 12/30/2022 0322   RBC 2.46 (L) 12/30/2022 0322   HGB 7.4 (L) 12/30/2022 0322   HCT 22.7 (L) 12/30/2022 0322   PLT 89 (L) 12/30/2022 0322   PLT 92 (L) 12/30/2022 0322   MCV 92.3 12/30/2022 0322   MCH 30.1 12/30/2022 0322   MCHC 32.6 12/30/2022 0322   RDW 15.0 12/30/2022 0322   LYMPHSABS 0.5 (L) 11/10/2022 0920   MONOABS 0.6 11/10/2022 0920   EOSABS 0.0 11/10/2022 0920   BASOSABS 0.0 11/10/2022 0920    BMET    Component Value Date/Time   NA 132 (L) 12/30/2022 0322   K 3.8 12/30/2022 0322   CL 106 12/30/2022 0322   CO2 18 (L) 12/30/2022 0322   GLUCOSE 121 (H) 12/30/2022 0322   BUN 31 (H) 12/30/2022 0322   CREATININE 1.73 (H) 12/30/2022 0322   CALCIUM 7.9 (L) 12/30/2022 0322   CALCIUM 8.2 (L) 10/29/2019 0512   GFRNONAA 39 (L) 12/30/2022 0322     IMAGING past 24 hours CT HEAD WO CONTRAST ( )  Result Date: 12/30/2022 CLINICAL DATA:  TIA EXAM: CT HEAD WITHOUT CONTRAST TECHNIQUE: Contiguous axial images were obtained from the base of the skull through the vertex without intravenous contrast. RADIATION DOSE REDUCTION: This exam was performed according to the departmental dose-optimization program which includes automated exposure control, adjustment of the mA and/or kV according to patient size and/or use of iterative reconstruction technique. COMPARISON:  12/29/2022 CTA head and neck, 9:30 a.m. FINDINGS: Evaluation is somewhat limited by motion artifact. Brain: Suspect trace subarachnoid hyperdense material in the left frontal and parietal lobe, near the vertex (series 10, images 40-41 and series 6, image 15), which is new from the prior exam and may represent trace subarachnoid hemorrhage versus contrast staining from recent interventional procedure. No evidence of acute infarction, mass, mass effect, or midline shift. No hydrocephalus. Vascular: No hyperdense vessel. Skull: Negative for fracture or focal lesion. Sinuses/Orbits: Mucosal thickening in the ethmoid air cells. No acute finding in the orbits. Other: The mastoid air cells are well aerated. IMPRESSION: Suspect trace subarachnoid hyperdense material in the left frontal and parietal lobe, near the vertex, which is new from the prior exam and may represent trace subarachnoid hemorrhage versus contrast staining from recent interventional procedure. These results will be called to the ordering clinician or representative by the Radiologist Assistant, and communication documented in the  PACS or [employer]. Electronically Signed   By: Wiliam Ke M.D.   On: 12/30/2022 00:11    Vitals:   12/30/22 1145 12/30/22 1200 12/30/22 1215 12/30/22 1230  BP:      Pulse: (!) 120 (!) 108 (!) 117 (!) 121  Resp: (!) 22 13 12  (!) 22  Temp:      TempSrc:      SpO2: 100% 100% 100% 99%  Weight:          PHYSICAL EXAM General:  Alert, well-nourished, well-developed patient in no acute distress Psych:  Mood and affect appropriate for situation CV: Regular rate and rhythm on monitor Respiratory:  Regular, unlabored respirations on room air GI: Abdomen soft and nontender   NEURO:  Mental Status: AA&Ox3, patient is able to give clear and coherent history Speech/Language: speech is without dysarthria or aphasia.  Naming, repetition, fluency, and comprehension intact.  Cranial Nerves:  II: PERRL. ? decreased right field of vision with decreased blink to threat, patient uncooperative with full visual exam.   III, IV, VI: EOMI. Eyelids elevate symmetrically.  V: Sensation is intact to light touch and symmetrical to face.  VII: Left facial droop VIII: hearing intact to voice. IX, X: Palate elevates symmetrically. Phonation is normal.  ZO:[id number] shrug 5/5. XII: tongue is midline without fasciculations. Motor: 5/5 strength to LUE, LLE.  RUE: 4/5 throughout with slight drift, 4/5 grip RLE: 4+/5 throughout with slight drift.   Tone: is normal and bulk is normal Sensation- Intact to light touch bilaterally.  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  ASPECTS 10.    CTA head & neck: Negative for LVO, positive for elongated thrombus within the left ICA.  Vessel remains patent but with high-grade estimated 80% stenosis. Post IR CT: trace SAH versus contrast staining from IR procedure.  Repeat CT 7/15: Negative  MRI: No MRI due to ICD being noncompatible  2D Echo: EF 30 to 35%, global hypokinesis, mild LVH, mild MVR, no shunt  LDL 57 HgbA1c 5.4 VTE prophylaxis - on hold due to thrombocytopenia (platelets 89 this am) Eliquis (apixaban) daily prior to admission, now on aspirin 81 mg daily and  Brilinta (ticagrelor) 90 mg bid  Therapy recommendations:  SNF Disposition:  ICU  MRSA Bacteremia ID consulted, abx per Vancomycin Pacemaker in place, probable need for TEE. EPS consulted and defer TEE to ID.   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  After 24 hours, will discuss with IR to see if we can lower the minimum BP requirement. Continue telemetry monitoring Current anticoagulation with aspirin and Brilinta. EPS consulted, seen by Dr. Lalla Brothers. Not an extraction candidate.  2D Echo: EF 30 to 35%, global hypokinesis, mild LVH, mild MVR, no shunt  Hyperlipidemia Home meds: Lipitor 80 mg, resumed in hospital LDL 57, goal < 70 Continue statin at discharge  Other Active Problems COPD Hx of Hepatitis Hx of H. Pylori infection Hx of Barrett Esophagus Hx of Prostate Cancer  Hospital day # 1   Pt seen by Neuro NP/APP and later by  MD. Note/plan to be edited by MD as needed.    Lynnae January, DNP, AGACNP-BC [facility] Please use AMION for contact information & EPIC for messaging.  STROKE MD NOTE :  I have personally obtained history,examined this patient, reviewed notes, independently viewed imaging studies, participated in medical decision making and plan of care.ROS completed by me personally and pertinent positives fully documented  I have made any additions or clarifications directly to the above note. Agree with note above.  Patient with MRSA bacteremia developed sudden onset of aphasia and right hemiparesis due to nonocclusive thrombus in the left ICA with recurrent fluctuating symptoms underwent rescue left MCA angioplasty and stenting with  mechanical thrombectomy.  Clinically patient doing quite well with very minimal residual deficits.  He will need dual antiplatelet therapy along with his anticoagulation may be an increased risk for bruising and bleeding.  Long discussion with patient and answered questions.  Discussed with Dr., Infectious disease and Dr. Wynona Neat critical care medicine.This patient is critically ill and at significant risk of neurological worsening, death and care requires constant monitoring of vital signs, hemodynamics,respiratory and cardiac monitoring, extensive review of multiple databases, frequent neurological assessment, discussion with family, other specialists and medical decision making of high complexity.I have made any additions or clarifications directly to the above note.This critical care time does not reflect procedure time, or teaching time or supervisory time of PA/NP/Med Resident etc but could involve care discussion time.  I spent 30 minutes of neurocritical care time  in the care of  this patient.      Delia Heady, MD Medical Director [facility] Pager: [phone] 12/30/2022 3:55 PM   To contact Stroke Continuity provider, please refer to [email or web address]. After hours, contact General Neurology

## 2022-12-30 NOTE — Progress Notes (Signed)
Date of service: 12/29/2022  Subject: I was notified at 03:28 that the patient had an episode of confusion and word salad that is new.  Prior to that he grew MRSA in his blood cultures and his vancomycin was continued.  I went immediately to evaluate the patient and noted that he was very irritable and agitated.  He would answer questions appropriately but was uncooperative with exam, jittery and nervous to being touched.  It was thought to be globally confused. He denied any headache or dizziness or blurred vision or diplopia.  He denies any palpitations or chest pain.  He had no reported focal muscle weakness or paresthesias.  Objective: On physical examination: Generally: He was agitated, jittery and nervous with no acute respiratory distress. Vital signs BP was 107/60 with heart rate of 133 with respiratory rate 22 and pulse oximetry was 100%.  Temperature was 98.7 and later 98.4. HEENT: Atraumatic, normocephalic, PERRLA, EOMI. Neck was supple with no JVD and carotid pulses 2+ bilaterally. OP: With mucous membrane and tongue.  No tongue deviation. Cardiovascular: Tachycardic irregularly irregular rhythm with no murmurs gallops or rubs. Lungs: Slightly diminished bibasal breath sounds with mild bibasal rales. Abdomen: Soft, nontender, nondistended with positive bowel sounds. Extremities: No pitting edema clubbing or cyanosis. Neurological: Cranial nerves II through XII are grossly intact with no facial droop, or slurred speech.  He had no lateralizing signs.  He was having mild word salad and decreased concentration and mild confusion.  It was alert and oriented to his name and would follow most of the commands.  He was moving all 4 extremities and was not noted to have any diminished muscle strength in either upper or lower extremities that were 5/5.   While I was evaluating the patient and random blood glucose was performed and it came back 64.  Prior to that a code stroke was called.  Given the  presentation of the patient was highly suspicious of hypoglycemia without other focal neurological deficits, an amp of D50 was ordered with complete resolution of his symptoms including agitation and irritability at return to his normal baseline mental status prior to this change.  Assessment/plan: 1.  Reversible hypoglycemia: The patient was immediately given an ampule of D50.  Given the complete resolution of the patient's symptoms after patient's blood glucose improved to 107, I canceled his code stroke and we continued to closely monitor him.  I do not believe that this incidence was related to a TIA or acute CVA given the complete resolution right after the given ampule of D50.  After this incident and up until 7 AM I had no further notifications about the patient from the nursing staff except to indicate the resolution of his symptoms shortly after being given the amp of D50..  It is my clinical judgment that this patient at that time was manifesting typical signs and symptoms of hypoglycemia that completely resolved with improvement of his blood glucose level.

## 2022-12-31 ENCOUNTER — Inpatient Hospital Stay (HOSPITAL_COMMUNITY): Payer: Medicare Other

## 2022-12-31 ENCOUNTER — Encounter: Payer: Medicare Other | Admitting: Family

## 2022-12-31 ENCOUNTER — Other Ambulatory Visit: Payer: Self-pay | Admitting: Radiology

## 2022-12-31 DIAGNOSIS — I4891 Unspecified atrial fibrillation: Secondary | ICD-10-CM | POA: Diagnosis not present

## 2022-12-31 DIAGNOSIS — M542 Cervicalgia: Secondary | ICD-10-CM

## 2022-12-31 DIAGNOSIS — I63232 Cerebral infarction due to unspecified occlusion or stenosis of left carotid arteries: Secondary | ICD-10-CM

## 2022-12-31 LAB — GLUCOSE, CAPILLARY
Glucose-Capillary: 76 mg/dL (ref 70–99)
Glucose-Capillary: 78 mg/dL (ref 70–99)
Glucose-Capillary: 85 mg/dL (ref 70–99)
Glucose-Capillary: 86 mg/dL (ref 70–99)
Glucose-Capillary: 88 mg/dL (ref 70–99)
Glucose-Capillary: 90 mg/dL (ref 70–99)

## 2022-12-31 LAB — BASIC METABOLIC PANEL
Anion gap: 7 (ref 5–15)
BUN: 24 mg/dL — ABNORMAL HIGH (ref 8–23)
CO2: 18 mmol/L — ABNORMAL LOW (ref 22–32)
Calcium: 8.4 mg/dL — ABNORMAL LOW (ref 8.9–10.3)
Chloride: 109 mmol/L (ref 98–111)
Creatinine, Ser: 1.75 mg/dL — ABNORMAL HIGH (ref 0.61–1.24)
GFR, Estimated: 39 mL/min — ABNORMAL LOW (ref >60)
Glucose, Bld: 81 mg/dL (ref 70–99)
Potassium: 3.7 mmol/L (ref 3.5–5.1)
Sodium: 134 mmol/L — ABNORMAL LOW (ref 135–145)

## 2022-12-31 LAB — CBC
HCT: 23.2 % — ABNORMAL LOW (ref 39.0–52.0)
Hemoglobin: 7.3 g/dL — ABNORMAL LOW (ref 13.0–17.0)
MCH: 28.9 pg (ref 26.0–34.0)
MCHC: 31.5 g/dL (ref 30.0–36.0)
MCV: 91.7 fL (ref 80.0–100.0)
Platelets: 101 10*3/uL — ABNORMAL LOW (ref 150–400)
RBC: 2.53 MIL/uL — ABNORMAL LOW (ref 4.22–5.81)
RDW: 15.3 % (ref 11.5–15.5)
WBC: 9.4 10*3/uL (ref 4.0–10.5)
nRBC: 0 % (ref 0.0–0.2)

## 2022-12-31 LAB — IRON AND TIBC
Iron: 13 ug/dL — ABNORMAL LOW (ref 45–182)
Saturation Ratios: 6 % — ABNORMAL LOW (ref 17.9–39.5)
TIBC: 211 ug/dL — ABNORMAL LOW (ref 250–450)
UIBC: 198 ug/dL

## 2022-12-31 LAB — CULTURE, BLOOD (ROUTINE X 2): Special Requests: ADEQUATE

## 2022-12-31 LAB — CK: Total CK: 92 U/L (ref 49–397)

## 2022-12-31 LAB — MAGNESIUM: Magnesium: 2.1 mg/dL (ref 1.7–2.4)

## 2022-12-31 LAB — CALCIUM, IONIZED: Calcium, Ionized, Serum: 4.7 mg/dL (ref 4.5–5.6)

## 2022-12-31 LAB — FERRITIN: Ferritin: 1030 ng/mL — ABNORMAL HIGH (ref 24–336)

## 2022-12-31 MED ORDER — METOPROLOL TARTRATE 12.5 MG HALF TABLET
37.5000 mg | ORAL_TABLET | Freq: Two times a day (BID) | ORAL | Status: DC
  Filled 2022-12-31 (×2): qty 3

## 2022-12-31 MED ORDER — ALPRAZOLAM 0.5 MG PO TABS
0.5000 mg | ORAL_TABLET | Freq: Two times a day (BID) | ORAL | Status: DC | PRN
  Administered 2022-12-31: 0.5 mg via ORAL
  Filled 2022-12-31: qty 1

## 2022-12-31 MED ORDER — OXYCODONE HCL 5 MG PO TABS
5.0000 mg | ORAL_TABLET | Freq: Four times a day (QID) | ORAL | Status: DC | PRN
  Administered 2022-12-31 – 2023-01-05 (×12): 5 mg via ORAL
  Filled 2022-12-31 (×13): qty 1

## 2022-12-31 MED ORDER — METOPROLOL TARTRATE 25 MG PO TABS
37.5000 mg | ORAL_TABLET | Freq: Two times a day (BID) | ORAL | Status: DC
  Administered 2022-12-31 (×2): 37.5 mg via ORAL
  Filled 2022-12-31 (×2): qty 1

## 2022-12-31 MED ORDER — APIXABAN 5 MG PO TABS
5.0000 mg | ORAL_TABLET | Freq: Two times a day (BID) | ORAL | Status: DC
  Administered 2022-12-31 – 2023-01-12 (×25): 5 mg via ORAL
  Filled 2022-12-31 (×25): qty 1

## 2022-12-31 MED ORDER — CHLORHEXIDINE GLUCONATE CLOTH 2 % EX PADS
6.0000 | MEDICATED_PAD | Freq: Every day | CUTANEOUS | Status: AC
  Administered 2022-12-31 – 2023-01-04 (×5): 6 via TOPICAL

## 2022-12-31 MED ORDER — HYDROMORPHONE HCL 1 MG/ML IJ SOLN
0.5000 mg | INTRAMUSCULAR | Status: DC | PRN
  Administered 2022-12-31 – 2023-01-01 (×4): 0.5 mg via INTRAVENOUS
  Filled 2022-12-31 (×7): qty 0.5

## 2022-12-31 MED ORDER — POTASSIUM CHLORIDE CRYS ER 20 MEQ PO TBCR
40.0000 meq | EXTENDED_RELEASE_TABLET | Freq: Once | ORAL | Status: AC
  Administered 2022-12-31: 40 meq via ORAL
  Filled 2022-12-31: qty 2

## 2022-12-31 MED ORDER — MIDODRINE HCL 5 MG PO TABS
30.0000 mg | ORAL_TABLET | Freq: Three times a day (TID) | ORAL | Status: DC
  Administered 2022-12-31 – 2023-01-02 (×6): 30 mg via ORAL
  Filled 2022-12-31 (×8): qty 6

## 2022-12-31 MED ORDER — MUPIROCIN 2 % EX OINT
1.0000 | TOPICAL_OINTMENT | Freq: Two times a day (BID) | CUTANEOUS | Status: AC
  Administered 2022-12-31 – 2023-01-04 (×10): 1 via NASAL
  Filled 2022-12-31 (×4): qty 22

## 2022-12-31 NOTE — Progress Notes (Signed)
Palliative:  Report received and chart reviewed.  Goals at this point are full code/full scope medical care; allow time for outcomes. Per chart review this am, patient continues to improve and may transfer out of ICU today.   PMT will continue to follow. No acute needs at this time.  Gerlean Ren, DNP, AGNP-C Palliative Medicine Team Team Phone # [phone]  Pager # [phone]  NO CHARGE

## 2022-12-31 NOTE — Progress Notes (Signed)
ANTICOAGULATION CONSULT NOTE - Initial Consult  Pharmacy Consult:  Eliquis Indication: atrial fibrillation and acute left intraluminal thrombus  Allergies  Allergen Reactions   Nitrofuran Derivatives Other (See Comments)    Transaminitis ** confounded w/Amiodarone, Mexiletine   Amiodarone Other (See Comments)    Unknown    Spironolactone Other (See Comments)    Significant transaminitis     Patient Measurements: Weight: 70.5 kg (155 lb 6.8 oz)  Vital Signs: Temp: 97.5 F (36.4 C) (07/16 0720) Temp Source: Oral (07/16 0337) BP: 99/65 (07/16 1400) Pulse Rate: 114 (07/16 1400)  Labs: Recent Labs    12/29/22 0836 12/29/22 2233 12/30/22 0015 12/30/22 0020 12/30/22 0322 12/31/22 0519 12/31/22 0924  HGB 8.4*  --  12.9*  --  7.4* 7.3*  --   HCT 26.2*  --  38.0*  --  22.7* 23.2*  --   PLT 109*  --   --   --  92*  89* 101*  --   APTT  --   --   --   --  50*  --   --   LABPROT  --   --   --   --  17.4*  --   --   INR  --   --   --   --  1.4*  --   --   CREATININE 2.12* 1.86*  --   --  1.73* 1.75*  --   CKTOTAL  --   --   --   --   --   --  92  TROPONINIHS  --  38*  --  38*  --   --   --     Estimated Creatinine Clearance: 32.6 mL/min (A) (by C-G formula based on SCr of 1.75 mg/dL (H)).   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 pylori infection    History of hepatitis    Hyperlipidemia    Hypertension    Liver cyst 05/16/15   PAF (paroxysmal atrial fibrillation) ([name])    Prostate CA ([name])     Assessment: 81 YOM with history of Afib on reduced-dose Eliquis PTA.  Patient has device-associated bacteremia and L  ICA thrombus s/p MT with stenting on 7/14.  Repeat CT showed that trace SAH vs contrast is no longer appreciated.  Neuro consulted Pharmacy to dose Eliquis.  Discussed with MD, will bypass Eliquis load given bleeding risk.  Hemoglobin/hematocrit low and stable; platelet count is trending back up.  No overt bleeding noted.  Goal of Therapy:  Appropriate anticoagulation Monitor platelets by anticoagulation protocol: Yes   Plan:  Resume Eliquis at 5mg  PO BID Pharmacy will sign off and follow peripherally.  Thank you for the consult!  Holdan Stucke D. Laney Potash, PharmD, BCPS, BCCCP 12/31/2022, 2:46 PM

## 2022-12-31 NOTE — Progress Notes (Signed)
[facility]   Reason for visit: Follow up on MRSA bacteremia  Interval History: repeat blood cultures sent; WBC 9.4, afebrile > 48 hours. + neck pain.   Day 4 total antibiotics  Physical Exam: Constitutional:  Vitals:   12/31/22 1200 12/31/22 1300  BP: 129/70 114/77  Pulse: (!) 122 98  Resp: (!) 25 16  Temp:    SpO2: 100% 100%   patient appears in NAD HENT: + cervical tenderness mid spine.  Respiratory: Normal respiratory effort  Review of Systems: Constitutional: negative for fevers and chills  Lab Results  Component Value Date   WBC 9.4 12/31/2022   HGB 7.3 (L) 12/31/2022   HCT 23.2 (L) 12/31/2022   MCV 91.7 12/31/2022   PLT 101 (L) 12/31/2022    Lab Results  Component Value Date   CREATININE 1.75 (H) 12/31/2022   BUN 24 (H) 12/31/2022   NA 134 (L) 12/31/2022   K 3.7 12/31/2022   CL 109 12/31/2022   CO2 18 (L) 12/31/2022    Lab Results  Component Value Date   ALT 20 12/29/2022   AST 28 12/29/2022   ALKPHOS 53 12/29/2022     Microbiology: Recent Results (from the past 240 hour(s))  MRSA Next Gen by PCR, Nasal     Status: Abnormal   Collection Time: 12/27/22  9:38 PM   Specimen: Nasal Mucosa; Nasal Swab  Result Value Ref Range Status   MRSA by PCR Next Gen DETECTED (A) NOT DETECTED Final    Comment: CRITICAL RESULT CALLED TO, READ BACK BY AND VERIFIED WITH: MELISSA COBB RN @ [phone] 12/28/22 [facility] (NOTE) The GeneXpert MRSA Assay (FDA approved for NASAL specimens only), is one component of a comprehensive MRSA colonization surveillance program. It is not intended to diagnose MRSA infection nor to guide or monitor treatment for MRSA infections. Test performance is not FDA approved in patients less than 22 years old. Performed at [facility], [address] [place], [place] [address]   Culture, blood (Routine X 2) w Reflex to ID Panel     Status: Abnormal   Collection Time: 12/28/22 10:47 AM   Specimen: BLOOD RIGHT  HAND  Result Value Ref Range Status   Specimen Description   Final    BLOOD RIGHT HAND Performed at [facility], [address] [place], [place] [address]    Special Requests   Final    BOTTLES DRAWN AEROBIC AND ANAEROBIC Blood Culture adequate volume Performed at [facility], [address] [place], [place] [address]    Culture  Setup Time   Final    GRAM POSITIVE COCCI IN BOTH AEROBIC AND ANAEROBIC BOTTLES CRITICAL RESULT CALLED TO, READ BACK BY AND VERIFIED WITH: TREY GREENWOOD @ 2146 12/28/22 LFD Performed at [facility], [address] [place], [place] [address]    Culture (A)  Final    STAPHYLOCOCCUS AUREUS SUSCEPTIBILITIES PERFORMED ON PREVIOUS CULTURE WITHIN THE LAST 5 DAYS. Performed at [facility], [address], [place], [place] [address]    Report Status 12/31/2022 FINAL  Final  Culture, blood (Routine X 2) w Reflex to ID Panel     Status: Abnormal   Collection Time: 12/28/22 10:48 AM   Specimen: BLOOD LEFT HAND  Result Value Ref Range Status   Specimen Description   Final    BLOOD LEFT HAND Performed at [facility], [address], [place], [place] [address]    Special Requests  Final    BOTTLES DRAWN AEROBIC AND ANAEROBIC Blood Culture adequate volume Performed at [facility], [address] [place], [place] [address]    Culture  Setup Time   Final    GRAM POSITIVE COCCI IN BOTH AEROBIC AND ANAEROBIC BOTTLES Organism ID to follow CRITICAL RESULT CALLED TO, READ BACK BY AND VERIFIED WITH: TREY GREENWOOD @2146  12/28/22 LFD Performed at [facility], [address] [place], [place] [address]    Culture METHICILLIN RESISTANT STAPHYLOCOCCUS AUREUS (A)  Final   Report Status 12/31/2022 FINAL  Final   Organism ID, Bacteria METHICILLIN RESISTANT STAPHYLOCOCCUS AUREUS  Final      Susceptibility   Methicillin resistant staphylococcus aureus - MIC*    CIPROFLOXACIN >=8 RESISTANT Resistant      ERYTHROMYCIN >=8 RESISTANT Resistant     GENTAMICIN <=0.5 SENSITIVE Sensitive     OXACILLIN >=4 RESISTANT Resistant     TETRACYCLINE <=1 SENSITIVE Sensitive     VANCOMYCIN <=0.5 SENSITIVE Sensitive     TRIMETH/SULFA >=320 RESISTANT Resistant     CLINDAMYCIN <=0.25 SENSITIVE Sensitive     RIFAMPIN <=0.5 SENSITIVE Sensitive     Inducible Clindamycin NEGATIVE Sensitive     LINEZOLID 2 SENSITIVE Sensitive     * METHICILLIN RESISTANT STAPHYLOCOCCUS AUREUS  Blood Culture ID Panel (Reflexed)     Status: Abnormal   Collection Time: 12/28/22 10:48 AM  Result Value Ref Range Status   Enterococcus faecalis NOT DETECTED NOT DETECTED Final   Enterococcus Faecium NOT DETECTED NOT DETECTED Final   Listeria monocytogenes NOT DETECTED NOT DETECTED Final   Staphylococcus species DETECTED (A) NOT DETECTED Final    Comment: CRITICAL RESULT CALLED TO, READ BACK BY AND VERIFIED WITH: TREY GREENWOOD @ 2146 12/28/22 LFD    Staphylococcus aureus (BCID) DETECTED (A) NOT DETECTED Final    Comment: Methicillin (oxacillin)-resistant Staphylococcus aureus (MRSA). MRSA is predictably resistant to beta-lactam antibiotics (except ceftaroline). Preferred therapy is vancomycin unless clinically contraindicated. Patient requires contact precautions if  hospitalized. CRITICAL RESULT CALLED TO, READ BACK BY AND VERIFIED WITH: TREY GREENWOOD @ 2146 12/28/22 LFD    Staphylococcus epidermidis NOT DETECTED NOT DETECTED Final   Staphylococcus lugdunensis NOT DETECTED NOT DETECTED Final   Streptococcus species NOT DETECTED NOT DETECTED Final   Streptococcus agalactiae NOT DETECTED NOT DETECTED Final   Streptococcus pneumoniae NOT DETECTED NOT DETECTED Final   Streptococcus pyogenes NOT DETECTED NOT DETECTED Final   A.calcoaceticus-baumannii NOT DETECTED NOT DETECTED Final   Bacteroides fragilis NOT DETECTED NOT DETECTED Final   Enterobacterales NOT DETECTED NOT DETECTED Final   Enterobacter cloacae complex NOT DETECTED  NOT DETECTED Final   Escherichia coli NOT DETECTED NOT DETECTED Final   Klebsiella aerogenes NOT DETECTED NOT DETECTED Final   Klebsiella oxytoca NOT DETECTED NOT DETECTED Final   Klebsiella pneumoniae NOT DETECTED NOT DETECTED Final   Proteus species NOT DETECTED NOT DETECTED Final   Salmonella species NOT DETECTED NOT DETECTED Final   Serratia marcescens NOT DETECTED NOT DETECTED Final   Haemophilus influenzae NOT DETECTED NOT DETECTED Final   Neisseria meningitidis NOT DETECTED NOT DETECTED Final   Pseudomonas aeruginosa NOT DETECTED NOT DETECTED Final   Stenotrophomonas maltophilia NOT DETECTED NOT DETECTED Final   Candida albicans NOT DETECTED NOT DETECTED Final   Candida auris NOT DETECTED NOT DETECTED Final   Candida glabrata NOT DETECTED NOT DETECTED Final   Candida krusei NOT DETECTED NOT DETECTED Final   Candida parapsilosis NOT DETECTED NOT DETECTED Final   Candida tropicalis NOT DETECTED  NOT DETECTED Final   Cryptococcus neoformans/gattii NOT DETECTED NOT DETECTED Final   Meth resistant mecA/C and MREJ DETECTED (A) NOT DETECTED Final    Comment: CRITICAL RESULT CALLED TO, READ BACK BY AND VERIFIED WITH: TREY GREENWOOD @ 2146 12/28/22 LFD Performed at [facility], [address] [place], [place] [address]   Respiratory (~20 pathogens) panel by PCR     Status: None   Collection Time: 12/28/22 11:55 AM   Specimen: Nasopharyngeal Swab; Respiratory  Result Value Ref Range Status   Adenovirus NOT DETECTED NOT DETECTED Final   Coronavirus 229E NOT DETECTED NOT DETECTED Final    Comment: (NOTE) The Coronavirus on the Respiratory Panel, DOES NOT test for the novel  Coronavirus (2019 nCoV)    Coronavirus HKU1 NOT DETECTED NOT DETECTED Final   Coronavirus NL63 NOT DETECTED NOT DETECTED Final   Coronavirus OC43 NOT DETECTED NOT DETECTED Final   Metapneumovirus NOT DETECTED NOT DETECTED Final   Rhinovirus / Enterovirus NOT DETECTED NOT DETECTED Final   Influenza A  NOT DETECTED NOT DETECTED Final   Influenza B NOT DETECTED NOT DETECTED Final   Parainfluenza Virus 1 NOT DETECTED NOT DETECTED Final   Parainfluenza Virus 2 NOT DETECTED NOT DETECTED Final   Parainfluenza Virus 3 NOT DETECTED NOT DETECTED Final   Parainfluenza Virus 4 NOT DETECTED NOT DETECTED Final   Respiratory Syncytial Virus NOT DETECTED NOT DETECTED Final   Bordetella pertussis NOT DETECTED NOT DETECTED Final   Bordetella Parapertussis NOT DETECTED NOT DETECTED Final   Chlamydophila pneumoniae NOT DETECTED NOT DETECTED Final   Mycoplasma pneumoniae NOT DETECTED NOT DETECTED Final    Comment: Performed at [facility], [address], [place], [place] [address]  SARS Coronavirus 2 by RT PCR (hospital order, performed in [facility] hospital lab) *cepheid single result test* Anterior Nasal Swab     Status: None   Collection Time: 12/29/22  1:06 PM   Specimen: Anterior Nasal Swab  Result Value Ref Range Status   SARS Coronavirus 2 by RT PCR NEGATIVE NEGATIVE Final    Comment: Performed at [facility], [address], [place], [place] [address]  MRSA Next Gen by PCR, Nasal     Status: Abnormal   Collection Time: 12/29/22  6:50 PM   Specimen: Nasal Mucosa; Nasal Swab  Result Value Ref Range Status   MRSA by PCR Next Gen DETECTED (A) NOT DETECTED Final    Comment: RESULT CALLED TO, READ BACK BY AND VERIFIED WITH: S. SAVAGE  RN 12/29/22 @ 2126 BY AB (NOTE) The GeneXpert MRSA Assay (FDA approved for NASAL specimens only), is one component of a comprehensive MRSA colonization surveillance program. It is not intended to diagnose MRSA infection nor to guide or monitor treatment for MRSA infections. Test performance is not FDA approved in patients less than 59 years old. Performed at [facility], [address], [place], [place] [address]     Impression/Plan:  1. MRSA bacteremia - repeat blood cultures sent today.  TTE without any signs of vegetation.   TEE  requested to evaluate valves and ICD and subsequent duration of treatment.    2.  ICD - in place with bacteremia.  TEE requested.  Is high risk for removal so will need to see TEE if prolonged antibiotics and +/- suppression indicated.    3.  Neck pain - this is new and on exam exquisitly tender over the mid portion concerning for discitis.  I have ordered  a CT scan to evaluate.

## 2022-12-31 NOTE — Evaluation (Signed)
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 coli 04/29/2022   Diarrhea 04/28/2022   Multifocal pneumonia 04/23/2022   Erosive esophagitis 04/22/2022   Dizziness 04/21/2022   Intractable nausea and vomiting 04/21/2022   Dyspnea 04/20/2022   Hyponatremia 04/19/2022   Orthostatic hypotension 04/18/2022   Postural dizziness with near syncope 04/18/2022   Near syncope 04/17/2022   COPD (chronic obstructive pulmonary disease) ([name]) 04/17/2022   Hyperkalemia 04/17/2022   Abnormal LFTs 04/17/2022   Chronic systolic CHF (congestive heart failure) ([name]) 04/17/2022   Normocytic anemia 04/17/2022   GERD without esophagitis 02/12/2022   Recurrent falls    Generalized weakness    NSVT (nonsustained ventricular tachycardia) ([name]) 02/11/2022   Acute on chronic combined systolic (congestive) and diastolic (congestive) heart failure ([name]) 08/10/2021   Acute respiratory failure with hypoxia ([name])    HCAP (healthcare-associated pneumonia) 06/13/2021   Acute on chronic systolic CHF (congestive heart failure) ([name]) 06/13/2021   Elevated troponin 06/13/2021   CKD (chronic kidney disease), 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   Lung nodule 06/13/2021    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   COPD exacerbation ([name]) 06/13/2021   Volume depletion 12/15/2019   Hypotension 12/15/2019   Cardiorenal syndrome    Atrial fibrillation with RVR ([name])    Defibrillator discharge    Tachycardia 10/25/2019   CHF (congestive heart failure) ([name]) 09/18/2019   Acute on chronic combined systolic and diastolic CHF (congestive heart failure) ([name]) 09/17/2019   CAD (coronary artery disease) 09/17/2019   Ischemic cardiomyopathy 09/17/2019   CKD (chronic kidney disease) stage 3, GFR 30-59 ml/min ([name]) 09/17/2019   Acute hypoxemic respiratory failure ([name]) 09/17/2018   AAA (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 ([name]) 07/06/2018   Essential hypertension 07/06/2018   Hyperlipidemia 07/06/2018   Paroxysmal atrial fibrillation ([name])    Acute on chronic respiratory failure with hypoxia ([name]) 04/26/2018   Past Medical History:  Past Medical History:  Diagnosis Date   Abdominal aortic aneurysm (AAA) ([name]) 05/13/15   seen on ct scan   Adenomatous colon polyp 03/18/2001, 03/14/2009, 10/06/2014   Anemia    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 pylori infection    History of hepatitis    Hyperlipidemia    Hypertension    Liver cyst 05/16/15   PAF (paroxysmal atrial fibrillation) ([name])    Prostate CA [facility])    Past Surgical History:  Past Surgical History:  Procedure Laterality Date   CATARACT EXTRACTION     COLONOSCOPY  10/06/2014, 09/18/2004, 03/14/2009   ESOPHAGOGASTRODUODENOSCOPY  10/06/2014, 03/18/2001, 03/14/2009   ESOPHAGOGASTRODUODENOSCOPY (EGD) WITH PROPOFOL N/A 05/07/2018   Procedure: ESOPHAGOGASTRODUODENOSCOPY (EGD) WITH PROPOFOL;  Surgeon: Wolbach, Laurel,  MD;  Location: [facility] ENDOSCOPY;  Service: Gastroenterology;  Laterality: N/A;   ESOPHAGOGASTRODUODENOSCOPY (EGD) WITH PROPOFOL N/A 04/22/2022    Procedure: ESOPHAGOGASTRODUODENOSCOPY (EGD) WITH PROPOFOL;  Surgeon: Toney Reil, MD;  Location: [facility] ENDOSCOPY;  Service: Endoscopy;  Laterality: N/A;   FLEXIBLE SIGMOIDOSCOPY  08/26/1990   HIP ARTHROPLASTY Right 10/31/2022   Procedure: ARTHROPLASTY BIPOLAR HIP (HEMIARTHROPLASTY)-RNFA;  Surgeon: Reinaldo Berber, MD;  Location: [facility] ORS;  Service: Orthopedics;  Laterality: Right;   INSERTION OF ICD     IR CT HEAD LTD  12/29/2022   IR CT HEAD LTD  12/29/2022   IR PERCUTANEOUS ART THROMBECTOMY/INFUSION INTRACRANIAL INC DIAG ANGIO  12/29/2022   PROSTATE SURGERY     RADIOLOGY WITH ANESTHESIA N/A 12/29/2022   Procedure: IR WITH ANESTHESIA;  Surgeon: Radiologist, Medication, MD;  Location: [facility] OR;  Service: Radiology;  Laterality: N/A;   TONSILLECTOMY     HPI:  82 yo male admitted to [facility] 7/12 with SOB with decompensated HF. 7/13 hypotension with sepsis. 7/14 Rt weakness with code stroke and transfer to [facility], s/p Lt ICA thrombectomy with residual stenosis ultimately requiring left ICA stent placement.  Extubated post procedure; confusion. PMhx: COPD, CHF, AFib, CKD, HTN. [name]   Assessment / Plan / Recommendation Clinical Impression  Mr. Mcguire is [employer] and presented with  cognitive-communication deficits related to attention, recall, and elements of language. Speech is clear and fluent. He followed simple commands but had difficulty with right/left discrimination and occasionally perseverated on target.  Naming to confrontation and responsive naming were intact.  Divergent naming tasks were limited to three items max per category (animals, furniture).  He occasionally perseverated on topics/words (e.g, stating he had pain in his neck, then continuing to respond with "neck" despite topic change). He had difficulty describing his career.  Conversation about his history revealed vague, repetitive output. Pt will benefit from continued SLP f/u while admitted to address language and cognitive deficits.  D/W RN.    SLP Assessment  SLP Visit Diagnosis: Cognitive communication deficit (R41.841)    Recommendations for follow up therapy are one component of a multi-disciplinary discharge planning process, led by the attending physician.  Recommendations may be updated based on patient status, additional functional criteria and insurance authorization.    Follow Up Recommendations    TBA   Assistance Recommended at Discharge  Frequent or constant Supervision/Assistance  Functional Status Assessment    Frequency and Duration min 1 x/week  2 weeks      SLP Evaluation Cognition  Overall Cognitive Status: Impaired/Different from baseline Arousal/Alertness: Awake/alert Orientation Level: Oriented to person;Disoriented to place;Disoriented to time (Simultaneous filing. User may not have seen previous data.) Attention: Sustained Sustained Attention: Appears intact Memory: Impaired Memory Impairment: Decreased short term memory Decreased Short Term Memory: Verbal basic Awareness: Impaired Awareness Impairment: Intellectual impairment (states neck is weak and that is all)       Comprehension  Auditory Comprehension Overall Auditory Comprehension: Impaired Yes/No Questions: Impaired Basic Immediate Environment Questions: 50-74% accurate Commands: Impaired One Step Basic Commands: 25-49% accurate Reading Comprehension Reading Status: Not tested    Expression Expression Primary Mode of Expression: Verbal Verbal Expression Overall Verbal Expression: Impaired Initiation: No impairment Automatic Speech:  (ids inaccurate categories) Level of Generative/Spontaneous Verbalization: Conversation Repetition: No impairment Naming: Impairment Responsive: 76-100% accurate Confrontation: Within functional limits Divergent: 25-49% accurate Written Expression Dominant Hand: Right Written Expression: Not tested   Oral / Motor  Oral Motor/Sensory Function Overall Oral Motor/Sensory Function:  Within functional limits Motor Speech Overall  Motor Speech: Appears within functional limits for tasks assessed           Ashleigh Luckow L. Samson Frederic, MA CCC/SLP Clinical Specialist - Acute Care SLP Acute Rehabilitation Services Office number [phone]  Blenda Mounts Laurice 12/31/2022, 4:55 PM

## 2022-12-31 NOTE — Progress Notes (Addendum)
NAME:  Matthew Zamora, MRN:  [id number], DOB:  [birth year], LOS: 2 ADMISSION DATE:  12/27/2022, CONSULTATION DATE: 7/14 REFERRING MD:  Clide Dales, CHIEF COMPLAINT:   MRSA bacteremia with septic shock And acute left internal carotid artery thrombus   History of Present Illness:  82 year old male patient who presented to the emergency room at a [facility] with acute onset of shortness of breath.  Denied any chest pain, no change in medications.  After diagnostic evaluation in the ER he was initially admitted with working diagnosis of acute on chronic heart failure with associated respiratory failure complicated further by atrial fibrillation with RVR .  He was treated with IV diuresis as well as calcium channel blockade course further complicated later following admission by hypotension requiring fluid resuscitation on 7/13 he continued to have atrial fibrillation with RVR he was started on amiodarone infusion and cardiology was consulted.  He also spiked fever at which time cultures and respiratory viral panel were sent and he was empirically started on cefepime and vancomycin.  He was moved to the intensive care.  Amiodarone later placed on hold due to hypotension with resuscitation focus changed to volume resuscitation.  BC ID panel was positive for Staph aureus/MRSA from blood cultures.  Respiratory viral panel positive for rhinovirus.  On 7/14 a code stroke was called around 8:30 in the morning when he developed sudden onset of right-sided weakness and aphasia as well as some agitation.  A stat CT angiogram was obtained this showed nearly occlusive ICA thrombus on the left.  Neurology and vascular surgery were consulted he was started on norepinephrine infusion to maintain mean arterial blood pressure and transferred to [facility] for neuro interventional radiology intervention critical care asked to assume primary care  Significant Hospital Events: Including procedures, antibiotic start and stop dates in  addition to other pertinent events   7/12 admitted [facility] with shortness of breath and initially a working diagnosis of acute decompensated heart failure 7/13 new fevers.  Blood cultures positive for MRSA antibiotic started, Vanco and cefepime hypotensive.  Having trouble with atrial fibrillation with RVR initially started on amiodarone which was subsequently discontinued 7/14 acute neurological decompensation.  CT angiogram showed partially occlusive thrombus in the left ICA.  Transferred to [facility] for neuro interventional radiology consultation and treatment.  Echocardiogram shows global hypokinesis worse in the septum LVEF down to 30 to 35% left ventricular hypertrophy diastolic parameters indeterminate RV size normal no mention of vegetation. Transferred to [facility] where he underwent  complete revascularization of the  proximal left internal carotid artery with 1 pass with a 6 mm x 47 embotrap  retrieval device with contact aspiration, and subsequent stent assisted angioplasty placed on Cangrelor for 4 hours. Transferred to ICU post op   Interim History / Subjective:  Overnight: Patient did have episode of confusion which patient was evaluated by hospitalist to this is more related to reversible hypoglycemia.  Patient amp D50 and had resolved.  Patient evaluated bedside this morning.  He states he is doing well.  He does feel a little short of breath.  He denies any other concerns this morning.   Objective   Blood pressure 126/70, pulse (!) 122, temperature (!) 97.5 F (36.4 C), resp. rate (!) 21, weight 70.5 kg, SpO2 100%.  Temperature afebrile, pulse 120s, respirations 19-24, blood pressure systolics 120s to 130s weaned off Levophed.  Currently on amiodarone.  SpO2 100% on room air.        Intake/Output Summary (Last 24 hours) at  12/31/2022 0924 Last data filed at 12/31/2022 0900 Gross per 24 hour  Intake 2562.71 ml  Output 1300 ml  Net 1262.71 ml   Filed Weights   12/29/22 1645 12/30/22  0500  Weight: 67.2 kg 70.5 kg    Examination: General: Chronically ill, resting in bed, no acute distress HENT: Asymmetric pupils with right 3 mm, left 2 mm, both reactive to light, EOMI Lungs: Clear to auscultation bilaterally Cardiovascular: Irregularly irregular rhythm, no murmurs, rubs, or gallops Abdomen: Soft, nontender, nondistended Extremities: Left shoulder with minimal range of motion on abduction secondary to pain Neuro: 4/5 strength noted to right lower extremity on hip flexion, 5/5 on dorsiflexion and plantarflexion.  5/5 strength noted to bilateral upper extremities.  Labs reviewed: Magnesium 2.1 BMP: Sodium 134, bicarb 18, BUN 24, creatinine 1.75, anion gap 7 CBC: White count 9.4, hemoglobin 7.3, platelet 101 Glucose: 76-119 Ferritin 1030 Iron 13, TIBC 211  Blood cultures: pending   CT head without contrast: Previously seen subarachnoid hemorrhage versus contrast staining overlying the left cerebral hemisphere is no longer appreciated. No new acute intracranial pathology.  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  #Left ICA thrombus, s/p thrombectomy and left ICA stent placement Patient evaluated bedside this morning.  Patient mentating better.  He still has some right-sided weakness noted to right lower extremity.  Otherwise, patient is improving.  He passes speech evaluation, and is speaking well.  Patient did have repeat CT yesterday which did not show any more concern for [facility], which likely was contrast staining.  Question today is if we need to repeat another CT to make sure patient is still stable.  Clinically patient is stable.  MRI would be preferable, but  patient has cardiac device that is not MRI compatible.  Will speak with neurology to see if we need to repeat CT head today. -Continue aspirin and statin  -Atorvastatin 80 mg  -Neurology following -PT/OT following, recommending continued acute therapy for now -Patient cleared by speech for diet -Frequent neurochecks  #MRSA bacteremia, source from cardiac device #Septic shock, resolved Patient is currently weaned off pressors.  Remains afebrile.  Repeat cultures pending.  Today is day 3 of vancomycin.  Cardiology evaluated yesterday, as well as infectious disease, as cardiac device could be potential source of infection.  Cardiology will not do surgery at this moment.  -Cardiology following, appreciate recommendations -ID following, appreciate recommendation -Continue vancomycin day 3 -Follow repeat cultures -Patient might benefit from TEE if he starts improving   #Combined systolic and diastolic heart failure #Elevated troponin No acute concern for exacerbation.  Patient seems euvolemic and does not need diuresis at this time.  -Hold diuresing -TTE showing reduced ejection fraction of 30 to 35% with global hypokinesis -Resume home Lipitor 80 mg daily -Holding home metoprolol tartrate 37.5 mg twice daily -Will likely need to titrate up GDMT medications blood pressure tolerates   #Atrial fibrillation with RVR Currently on amiodarone.  Still having rates up into the 120s.  Hemodynamically stable at this moment. -Currently on aspirin and Brilinta -start back home metoprolol 37.5 mg bid  -wean off amiodarone  -Might need to be on DOAC, will reach out to IR for best regimen to be on  -  Did speak with Anders Grant of interventional radiology about medication regimen for anticoagulation and antiplatelet who will speak with Dr. Corliss Skains of IR to come up with plan.  Awaiting response from them.  #Acute metabolic encephalopathy secondary to sepsis Resolved.  Mentating much better. From  delirium precautions -Precedex weaned off -Antibiotic treatment as above   #Left shoulder pain Less likely pyomyositis.  Will obtain CK to check for any muscle breakdown.  Will give supportive care for now.  Physical therapy following. -Physical therapy -Oxycodone every 6 hours as needed -Lidocaine patch, Voltaren gel -CK pending   #Acute on chronic CKD stage IIIb #NAGMA #Hyponatremia, improving Creatinine stable at 1.75.  Bicarb stable at 18.  Sodium has improved.  Likely due to ATN.  NAGMA in the setting of underlying renal insufficiency.   -Monitor BMP -Monitor creatinine -Monitor urine output   #Normocytic anemia, likely anemia of chronic disease Hemoglobin stable at 7.3 this morning.  No bleeding appreciated.  Iron studies pointing towards anemia of chronic disease.  Likely complicated by critical illness. -Continue monitor CBC   #Thrombocytopenia Likely sepsis related.  No acute concern for bleed at this time. -Monitor platelet count  #GERD -On home Protonix 40 mg twice daily -Continue home Carafate  #Chronic hypotension -Continue home midodrine 30 mg 3 times daily -Continue home Mestinon 60 mg every 8 hours  #Depression/anxiety -Continue home sertraline 25 mg daily    Best Practice (right click and "Reselect all SmartList Selections" daily)   Diet/type: NPO DVT prophylaxis: SCD GI prophylaxis: N/A Lines: N/A Foley:  N/A Code Status:  full code Last date of multidisciplinary goals of care discussion [pending]  Labs   CBC: Recent Labs  Lab 12/27/22 1216 12/28/22 0409 12/29/22 0836 12/30/22 0015 12/30/22 0322 12/31/22 0519  WBC 10.9* 20.6* 12.9*  --  9.0 9.4  HGB 9.7* 8.7* 8.4* 12.9* 7.4* 7.3*  HCT 31.5* 26.8* 26.2* 38.0* 22.7* 23.2*  MCV 94.9 91.8 92.6  --  92.3 91.7  PLT 178 141* 109*  --  92*  89* 101*    Basic Metabolic Panel: Recent Labs  Lab 12/27/22 1216 12/28/22 0409 12/29/22 0836 12/29/22 2233 12/30/22 0015 12/30/22 0322  12/31/22 0519  NA 138 134* 132* 137 136 132* 134*  K 4.2 4.1 3.7 3.7 3.5 3.8 3.7  CL 105 103 102 105  --  106 109  CO2 25 24 21* 20*  --  18* 18*  GLUCOSE 108* 102* 97 91  --  121* 81  BUN 19 29* 39* 33*  --  31* 24*  CREATININE 1.54* 1.76* 2.12* 1.86*  --  1.73* 1.75*  CALCIUM 9.4 8.4* 7.7* 8.1*  --  7.9* 8.4*  MG 1.9  --   --  1.6*  --  2.1 2.1  PHOS  --   --   --  3.0  --  2.9  --    GFR: Estimated Creatinine Clearance: 32.6 mL/min (A) (by C-G formula based on SCr of 1.75 mg/dL (H)). Recent Labs  Lab 12/28/22 0409 12/28/22 1340 12/29/22 0836 12/30/22 0322 12/31/22 0519  PROCALCITON  --   --  5.58  --   --   WBC 20.6*  --  12.9* 9.0 9.4  LATICACIDVEN  --  1.6  --   --   --     Liver Function Tests: Recent Labs  Lab 12/27/22 1216 12/28/22 0409 12/29/22 0836  AST 25 22 28   ALT 25 21 20   ALKPHOS 81 66 53  BILITOT 0.6  0.9 0.9  PROT 6.2* 5.7* 5.1*  ALBUMIN 3.5 3.3* 2.8*   No results for input(s): "LIPASE", "AMYLASE" in the last 168 hours. No results for input(s): "AMMONIA" in the last 168 hours.  ABG    Component Value Date/Time   PHART 7.339 (L) 12/30/2022 0015   PCO2ART 35.1 12/30/2022 0015   PO2ART 133 (H) 12/30/2022 0015   HCO3 18.8 (L) 12/30/2022 0015   TCO2 20 (L) 12/30/2022 0015   ACIDBASEDEF 6.0 (H) 12/30/2022 0015   O2SAT 99 12/30/2022 0015     Coagulation Profile: Recent Labs  Lab 12/30/22 0322  INR 1.4*    Cardiac Enzymes: No results for input(s): "CKTOTAL", "CKMB", "CKMBINDEX", "TROPONINI" in the last 168 hours.  HbA1C: Hgb A1c MFr Bld  Date/Time Value Ref Range Status  12/29/2022 06:24 PM 5.4 4.8 - 5.6 % Final    Comment:    (NOTE) Pre diabetes:          5.7%-6.4%  Diabetes:              >6.4%  Glycemic control for   <7.0% adults with diabetes   06/12/2021 06:00 PM 5.1 4.8 - 5.6 % Final    Comment:    (NOTE) Pre diabetes:          5.7%-6.4%  Diabetes:              >6.4%  Glycemic control for   <7.0% adults with diabetes      CBG: Recent Labs  Lab 12/30/22 1521 12/30/22 1920 12/30/22 2314 12/31/22 0335 12/31/22 0717  GLUCAP 114* 119* 114* 76 85    Review of Systems:   Not able   Past Medical History:  He,  has a past medical history of Abdominal aortic aneurysm (AAA) ([name]) (05/13/15), Adenomatous colon polyp (03/18/2001, 03/14/2009, 10/06/2014), Anemia, 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 pylori infection, History of hepatitis, Hyperlipidemia, Hypertension, Liver cyst (05/16/15), PAF (paroxysmal atrial fibrillation) ([name]), and Prostate CA ([name]).   Surgical History:   Past Surgical History:  Procedure Laterality Date   CATARACT EXTRACTION     COLONOSCOPY  10/06/2014, 09/18/2004, 03/14/2009   ESOPHAGOGASTRODUODENOSCOPY  10/06/2014, 03/18/2001, 03/14/2009   ESOPHAGOGASTRODUODENOSCOPY (EGD) WITH PROPOFOL N/A 05/07/2018   Procedure: ESOPHAGOGASTRODUODENOSCOPY (EGD) WITH PROPOFOL;  Surgeon: Toledo, Boykin Nearing, MD;  Location: [facility];  Service: Gastroenterology;  Laterality: N/A;   ESOPHAGOGASTRODUODENOSCOPY (EGD) WITH PROPOFOL N/A 04/22/2022   Procedure: ESOPHAGOGASTRODUODENOSCOPY (EGD) WITH PROPOFOL;  Surgeon: Toney Reil, MD;  Location: [facility] END[facility];  Service: Endoscopy;  Laterality: N/A;   FLEXIBLE SIGMOIDOSCOPY  08/26/1990   HIP ARTHROPLASTY Right 10/31/2022   Procedure: ARTHROPLASTY BIPOLAR HIP (HEMIARTHROPLASTY)-RNFA;  Surgeon: Reinaldo Berber, MD;  Location: [facility] ORS;  Service: Orthopedics;  Laterality: Right;   INSERTION OF ICD     IR CT HEAD LTD  12/29/2022   IR CT HEAD LTD  12/29/2022   IR PERCUTANEOUS ART THROMBECTOMY/INFUSION INTRACRANIAL INC DIAG ANGIO  12/29/2022   PROSTATE SURGERY     RADIOLOGY WITH ANESTHESIA N/A 12/29/2022   Procedure: IR WITH ANESTHESIA;  Surgeon: Radiologist, Medication, MD;  Location: [facility] OR;  Service:  Radiology;  Laterality: N/A;   TONSILLECTOMY       Social History:   reports that he quit smoking about 23 years ago. His smoking use included cigarettes. He started smoking about 61 years ago. He has a 38 pack-year smoking history. He has never used smokeless tobacco. He  reports that he does not currently use drugs. He reports that he does not drink alcohol.   Family History:  His family history includes Heart attack in his father and mother.   Allergies Allergies  Allergen Reactions   Nitrofuran Derivatives Other (See Comments)    Transaminitis ** confounded w/Amiodarone, Mexiletine   Amiodarone Other (See Comments)    Unknown    Spironolactone Other (See Comments)    Significant transaminitis      Home Medications  Prior to Admission medications   Medication Sig Start Date End Date Taking? Authorizing Provider  acetaminophen (TYLENOL) 325 MG tablet Take 2 tablets (650 mg total) by mouth every 4 (four) hours as needed for headache or mild pain. 10/29/19   Judithe Modest, NP  albuterol (PROVENTIL) (2.5 MG/3ML) 0.083% nebulizer solution Inhale 3 mLs (2.5 mg total) into the lungs as needed for shortness of breath. 10/29/19   Judithe Modest, NP  ascorbic acid (VITAMIN C) 500 MG tablet Take 500 mg by mouth daily.    [provider]  atorvastatin (LIPITOR) 80 MG tablet Take 80 mg by mouth daily. 05/13/21   [provider]  calcium carbonate (TUMS EX) 750 MG chewable tablet Chew 1 tablet by mouth daily.    [provider]  ELIQUIS 2.5 MG TABS tablet Take 2.5 mg by mouth 2 (two) times daily. 12/28/21   [provider]  fluticasone-salmeterol (ADVAIR) 500-50 MCG/ACT AEPB Inhale 1 puff into the lungs in the morning and at bedtime. 04/05/22   [provider]  gabapentin (NEURONTIN) 100 MG capsule Take 100 mg by mouth at bedtime. 12/09/19   [provider]  guaiFENesin-dextromethorphan (ROBITUSSIN DM) 100-10 MG/5ML syrup Take 5 mLs by  mouth every 4 (four) hours as needed for cough. 11/18/22   Enedina Finner, MD  magnesium oxide (MAG-OX) 400 (240 Mg) MG tablet Take 1 tablet by mouth daily. 02/05/21   [provider]  melatonin 3 MG TABS tablet Take 9 mg by mouth at bedtime.    [provider]  metoCLOPramide (REGLAN) 5 MG tablet Take 1 tablet (5 mg total) by mouth every 8 (eight) hours as needed for refractory nausea / vomiting. 05/13/22   Delfino Lovett, MD  metoprolol tartrate 37.5 MG TABS Take 1 tablet (37.5 mg total) by mouth 2 (two) times daily. 12/20/22 01/19/23  Lucile Shutters, MD  midodrine (PROAMATINE) 5 MG tablet Take 1 tablet (5 mg total) by mouth 3 (three) times daily with meals. Patient taking differently: Take 30 mg by mouth 3 (three) times daily with meals. 11/18/22   Enedina Finner, MD  Multiple Vitamin (MULTIVITAMIN WITH MINERALS) TABS tablet Take 1 tablet by mouth daily. 10/29/19   Judithe Modest, NP  Nebulizer MISC 1 each by Does not apply route as needed. 06/16/21   Rolly Salter, MD  Nutritional Supplements (,FEEDING SUPPLEMENT, PROSOURCE PLUS) liquid Take 30 mLs by mouth 3 (three) times daily between meals. 05/13/22   Delfino Lovett, MD  ondansetron (ZOFRAN-ODT) 4 MG disintegrating tablet Take 4 mg by mouth every 8 (eight) hours as needed for refractory nausea / vomiting, vomiting or nausea.    [provider]  pantoprazole (PROTONIX) 40 MG tablet Take 1 tablet (40 mg total) by mouth 2 (two) times daily. 05/13/22   Delfino Lovett, MD  polyethylene glycol (MIRALAX / GLYCOLAX) 17 g packet Take 17 g by mouth 2 (two) times daily. 11/07/22   Leeroy Bock, MD  pyridostigmine (MESTINON) 60 MG tablet Take 1  tablet (60 mg total) by mouth every 8 (eight) hours. 05/13/22   Delfino Lovett, MD  sertraline (ZOLOFT) 25 MG tablet Take 25 mg by mouth daily.    [provider]  sucralfate (CARAFATE) 1 GM/10ML suspension Take 10 mLs (1 g total) by mouth 4 (four) times daily -  with meals and at bedtime.  05/13/22   Delfino Lovett, MD  tamsulosin (FLOMAX) 0.4 MG CAPS capsule Take 0.4 mg by mouth daily. 12/09/19   [provider]     Critical care time: 35 mins    Modena Slater, [facility] Internal Medicine Resident PGY-2 [phone] Pager # [phone] OR # [phone] if no answer

## 2022-12-31 NOTE — Progress Notes (Addendum)
STROKE TEAM PROGRESS NOTE   BRIEF HPI Mr. Matthew Zamora is a 82 y.o. male with history of O2 dep COPD, HTN, HLD, CAD (PCI > RCA 2019), ICM, ICD, VT, AFi  currently hospitalized with sepsis, found to have MRSA bacteremia.  Patient c/o neck pain in the setting of bacteremia, there could be a risk of meningitis. He is not safe to LP given his anticoagulation. Currently on aspirin and brilinta.   SIGNIFICANT HOSPITAL EVENTS 7/14: Code stroke activated overnight, then cancelled d/t resolution of symptoms. CTA showed non-occlusive thrombus in left ICA. Transferred to [facility] for emergent thrombectomy.  Status post stent assisted angioplasty of proximal Lt ICA stenosis with proximal and distal protection.  7/15: Levophed for hypotension. Repeat CT negative.   INTERIM HISTORY/SUBJECTIVE On exam today, patient is sitting up in bed resting comfortably.  Remains on antibiotics.  He is afebrile.  White count is normal.  He still complains of neck pain..  Patient continues to state that he wants to go home.    Repeat CT scan of the head shows complete resolution of the previously seen left convexity subarachnoid hemorrhage versus contrast staining.  No large acute infarct or hemorrhagic transformation was noted.  Plan to resume anticoagulation OBJECTIVE  CBC    Component Value Date/Time   WBC 9.4 12/31/2022 0519   RBC 2.53 (L) 12/31/2022 0519   HGB 7.3 (L) 12/31/2022 0519   HCT 23.2 (L) 12/31/2022 0519   PLT 101 (L) 12/31/2022 0519   MCV 91.7 12/31/2022 0519   MCH 28.9 12/31/2022 0519   MCHC 31.5 12/31/2022 0519   RDW 15.3 12/31/2022 0519   LYMPHSABS 0.5 (L) 11/10/2022 0920   MONOABS 0.6 11/10/2022 0920   EOSABS 0.0 11/10/2022 0920   BASOSABS 0.0 11/10/2022 0920    BMET    Component Value Date/Time   NA 134 (L) 12/31/2022 0519   K 3.7 12/31/2022 0519   CL 109 12/31/2022 0519   CO2 18 (L) 12/31/2022 0519   GLUCOSE 81 12/31/2022 0519   BUN 24 (H) 12/31/2022 0519   CREATININE 1.75 (H)  12/31/2022 0519   CALCIUM 8.4 (L) 12/31/2022 0519   CALCIUM 8.2 (L) 10/29/2019 0512   GFRNONAA 39 (L) 12/31/2022 0519    IMAGING past 24 hours No results found.  Vitals:   12/31/22 1100 12/31/22 1200 12/31/22 1300 12/31/22 1400  BP: 119/72 129/70 114/77 99/65  Pulse: (!) 124 (!) 122 98 (!) 114  Resp: (!) 21 (!) 25 16 (!) 22  Temp:      TempSrc:      SpO2: 100% 100% 100% 100%  Weight:         PHYSICAL EXAM General:  Alert, well-nourished, well-developed patient in no acute distress Psych:  Mood and affect appropriate for situation CV: Regular rate and rhythm on monitor Respiratory:  Regular, unlabored respirations on room air GI: Abdomen soft and nontender   NEURO:  Mental Status: AA&Ox3, patient is able to give clear and coherent history Speech/Language: speech is without dysarthria or aphasia.  Naming, repetition, fluency, and comprehension intact.  Cranial Nerves:  II: PERRL.  Attorney discussed Matthew Zamora and to him the 1 with the.   III, IV, VI: EOMI. Eyelids elevate symmetrically.  V: Sensation is intact to light touch and symmetrical to face.  VII: Left facial droop VIII: hearing intact to voice. IX, X: Palate elevates symmetrically. Phonation is normal.  JY:NWGNFAOZ shrug 5/5. XII: tongue is midline without fasciculations. Motor: 5/5 strength to LUE, LLE.  RUE: 4/5 throughout with slight drift, 4/5 grip RLE: 4+/5 throughout with slight drift.   Tone: is normal and bulk is normal Sensation- Intact to light touch bilaterally.  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  ASPECTS 10.    CTA head & neck: Negative for LVO, positive for elongated thrombus within the left ICA.  Vessel remains patent but with high-grade estimated 80% stenosis. Post IR CT: trace SAH versus  contrast staining from IR procedure.  Repeat CT 7/15: Negative  MRI: No MRI due to ICD being noncompatible  2D Echo: EF 30 to 35%, global hypokinesis, mild LVH, mild MVR, no shunt  LDL 57 HgbA1c 5.4 VTE prophylaxis - on hold due to thrombocytopenia (platelets 89 this am) Eliquis (apixaban) daily prior to admission, now on aspirin 81 mg daily and Brilinta (ticagrelor) 90 mg bid  Therapy recommendations:  SNF Disposition:  ICU  MRSA Bacteremia ID consulted, abx per Vancomycin Pacemaker in place, probable need for TEE. EPS consulted and defer TEE to ID.   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  After 24 hours, will discuss with IR to see if we can lower the minimum BP requirement. Continue telemetry monitoring Current anticoagulation with aspirin and Brilinta. EPS consulted, seen by Dr. Lalla Brothers. Not an extraction candidate.  2D Echo: EF 30 to 35%, global hypokinesis, mild LVH, mild MVR, no shunt  Hyperlipidemia Home meds: Lipitor 80 mg, resumed in hospital LDL 57, goal < 70 Continue statin at discharge  Other Active Problems COPD Hx of Hepatitis Hx of H. Pylori infection Hx of Barrett Esophagus Hx of Prostate Cancer  Hospital day # 2    Patient with MRSA bacteremia developed sudden onset of aphasia and right hemiparesis due to nonocclusive thrombus in the left ICA with recurrent fluctuating symptoms underwent rescue left MCA angioplasty and stenting with mechanical thrombectomy.  Clinically patient doing quite well with very minimal residual deficits.  He will need antiplatelet therapy for his stent along with his anticoagulation may be an increased risk  for bruising and bleeding.  Discussed with Dr. Corliss Skains patient can stay on Brilinta plus Eliquis and no need for concurrent aspirin.  Long discussion with patient and answered questions.  Mobilize out of bed.  Therapy consults.  Stroke team will sign off.  Kindly call for questions.  Discussed  Dr. Wynona Neat critical care medicine.   This patient is critically ill and at significant risk of neurological worsening, death and care requires constant monitoring of vital signs, hemodynamics,respiratory and cardiac monitoring, extensive review of multiple databases, frequent neurological assessment, discussion with family, other specialists and medical decision making of high complexity.I have made any additions or clarifications directly to the above note.This critical care time does not reflect procedure time, or teaching time or supervisory time of PA/NP/Med Resident etc but could involve care discussion time.  I spent 30 minutes of neurocritical care time  in the care of  this patient.       Matthew Heady, MD Medical Director [facility] Pager: [phone] 12/31/2022 2:31 PM  To contact Stroke Continuity provider, please refer to [email or web address]. After hours, contact General Neurology

## 2022-12-31 NOTE — Plan of Care (Deleted)
  Problem: Fluid Volume: Goal: Hemodynamic stability will improve Outcome: Progressing   Problem: Clinical Measurements: Goal: Diagnostic test results will improve Outcome: Progressing Goal: Signs and symptoms of infection will decrease Outcome: Progressing   Problem: Respiratory: Goal: Ability to maintain adequate ventilation will improve Outcome: Progressing   Problem: Education: Goal: Knowledge of General Education information will improve Description: Including pain rating scale, medication(s)/side effects and non-pharmacologic comfort measures Outcome: Progressing   Problem: Health Behavior/Discharge Planning: Goal: Ability to manage health-related needs will improve Outcome: Progressing   Problem: Clinical Measurements: Goal: Ability to maintain clinical measurements within normal limits will improve Outcome: Progressing Goal: Will remain free from infection Outcome: Progressing Goal: Diagnostic test results will improve Outcome: Progressing Goal: Respiratory complications will improve Outcome: Progressing Goal: Cardiovascular complication will be avoided Outcome: Progressing   Problem: Activity: Goal: Risk for activity intolerance will decrease Outcome: Progressing   Problem: Nutrition: Goal: Adequate nutrition will be maintained Outcome: Progressing   Problem: Coping: Goal: Level of anxiety will decrease Outcome: Progressing   Problem: Elimination: Goal: Will not experience complications related to bowel motility Outcome: Progressing Goal: Will not experience complications related to urinary retention Outcome: Progressing   Problem: Pain Managment: Goal: General experience of comfort will improve Outcome: Progressing   Problem: Safety: Goal: Ability to remain free from injury will improve Outcome: Progressing   Problem: Skin Integrity: Goal: Risk for impaired skin integrity will decrease Outcome: Progressing   Problem: Education: Goal:  Understanding of CV disease, CV risk reduction, and recovery process will improve Outcome: Progressing Goal: Individualized Educational Video(s) Outcome: Progressing   Problem: Activity: Goal: Ability to return to baseline activity level will improve Outcome: Progressing   Problem: Cardiovascular: Goal: Ability to achieve and maintain adequate cardiovascular perfusion will improve Outcome: Progressing Goal: Vascular access site(s) Level 0-1 will be maintained Outcome: Progressing   Problem: Health Behavior/Discharge Planning: Goal: Ability to safely manage health-related needs after discharge will improve Outcome: Progressing   Problem: Education: Goal: Knowledge of disease or condition will improve Outcome: Progressing Goal: Knowledge of secondary prevention will improve (MUST DOCUMENT ALL) Outcome: Progressing Goal: Knowledge of patient specific risk factors will improve Loraine Leriche N/A or DELETE if not current risk factor) Outcome: Progressing   Problem: Ischemic Stroke/TIA Tissue Perfusion: Goal: Complications of ischemic stroke/TIA will be minimized Outcome: Progressing   Problem: Coping: Goal: Will verbalize positive feelings about self Outcome: Progressing Goal: Will identify appropriate support needs Outcome: Progressing   Problem: Health Behavior/Discharge Planning: Goal: Ability to manage health-related needs will improve Outcome: Progressing Goal: Goals will be collaboratively established with patient/family Outcome: Progressing   Problem: Self-Care: Goal: Ability to participate in self-care as condition permits will improve Outcome: Progressing Goal: Verbalization of feelings and concerns over difficulty with self-care will improve Outcome: Progressing Goal: Ability to communicate needs accurately will improve Outcome: Progressing   Problem: Nutrition: Goal: Risk of aspiration will decrease Outcome: Progressing Goal: Dietary intake will improve Outcome:  Progressing

## 2022-12-31 NOTE — Plan of Care (Signed)
Problem: Fluid Volume: Goal: Hemodynamic stability will improve 12/31/2022 1811 by Conley Simmonds, RN Outcome: Progressing 12/31/2022 1741 by Conley Simmonds, RN Outcome: Progressing   Problem: Clinical Measurements: Goal: Diagnostic test results will improve 12/31/2022 1811 by Conley Simmonds, RN Outcome: Progressing 12/31/2022 1741 by Conley Simmonds, RN Outcome: Progressing Goal: Signs and symptoms of infection will decrease 12/31/2022 1811 by Conley Simmonds, RN Outcome: Progressing 12/31/2022 1741 by Conley Simmonds, RN Outcome: Progressing   Problem: Respiratory: Goal: Ability to maintain adequate ventilation will improve 12/31/2022 1811 by Conley Simmonds, RN Outcome: Progressing 12/31/2022 1741 by Conley Simmonds, RN Outcome: Progressing   Problem: Education: Goal: Knowledge of General Education information will improve Description: Including pain rating scale, medication(s)/side effects and non-pharmacologic comfort measures 12/31/2022 1811 by Conley Simmonds, RN Outcome: Progressing 12/31/2022 1741 by Conley Simmonds, RN Outcome: Progressing   Problem: Health Behavior/Discharge Planning: Goal: Ability to manage health-related needs will improve 12/31/2022 1811 by Conley Simmonds, RN Outcome: Progressing 12/31/2022 1741 by Conley Simmonds, RN Outcome: Progressing   Problem: Clinical Measurements: Goal: Ability to maintain clinical measurements within normal limits will improve 12/31/2022 1811 by Conley Simmonds, RN Outcome: Progressing 12/31/2022 1741 by Conley Simmonds, RN Outcome: Progressing Goal: Will remain free from infection 12/31/2022 1811 by Conley Simmonds, RN Outcome: Progressing 12/31/2022 1741 by Conley Simmonds, RN Outcome: Progressing Goal: Diagnostic test results will improve 12/31/2022 1811 by Conley Simmonds, RN Outcome: Progressing 12/31/2022 1741 by Conley Simmonds, RN Outcome:  Progressing Goal: Respiratory complications will improve 12/31/2022 1811 by Conley Simmonds, RN Outcome: Progressing 12/31/2022 1741 by Conley Simmonds, RN Outcome: Progressing Goal: Cardiovascular complication will be avoided 12/31/2022 1811 by Conley Simmonds, RN Outcome: Progressing 12/31/2022 1741 by Conley Simmonds, RN Outcome: Progressing   Problem: Activity: Goal: Risk for activity intolerance will decrease 12/31/2022 1811 by Conley Simmonds, RN Outcome: Progressing 12/31/2022 1741 by Conley Simmonds, RN Outcome: Progressing   Problem: Nutrition: Goal: Adequate nutrition will be maintained 12/31/2022 1811 by Conley Simmonds, RN Outcome: Progressing 12/31/2022 1741 by Conley Simmonds, RN Outcome: Progressing   Problem: Coping: Goal: Level of anxiety will decrease 12/31/2022 1811 by Conley Simmonds, RN Outcome: Progressing 12/31/2022 1741 by Conley Simmonds, RN Outcome: Progressing   Problem: Elimination: Goal: Will not experience complications related to bowel motility 12/31/2022 1811 by Conley Simmonds, RN Outcome: Progressing 12/31/2022 1741 by Conley Simmonds, RN Outcome: Progressing Goal: Will not experience complications related to urinary retention 12/31/2022 1811 by Conley Simmonds, RN Outcome: Progressing 12/31/2022 1741 by Conley Simmonds, RN Outcome: Progressing   Problem: Pain Managment: Goal: General experience of comfort will improve 12/31/2022 1811 by Conley Simmonds, RN Outcome: Progressing 12/31/2022 1741 by Conley Simmonds, RN Outcome: Progressing   Problem: Safety: Goal: Ability to remain free from injury will improve 12/31/2022 1811 by Conley Simmonds, RN Outcome: Progressing 12/31/2022 1741 by Conley Simmonds, RN Outcome: Progressing   Problem: Skin Integrity: Goal: Risk for impaired skin integrity will decrease 12/31/2022 1811 by Conley Simmonds, RN Outcome: Progressing 12/31/2022 1741 by  Conley Simmonds, RN Outcome: Progressing   Problem: Education: Goal: Understanding of CV disease, CV risk reduction, and recovery process will improve 12/31/2022 1811 by Conley Simmonds, RN Outcome: Progressing 12/31/2022 1741 by Conley Simmonds, RN Outcome: Progressing Goal: Individualized Educational Video(s) 12/31/2022 1811 by Conley Simmonds, RN Outcome: Progressing 12/31/2022 1741  by Conley Simmonds, RN Outcome: Progressing   Problem: Activity: Goal: Ability to return to baseline activity level will improve 12/31/2022 1811 by Conley Simmonds, RN Outcome: Progressing 12/31/2022 1741 by Conley Simmonds, RN Outcome: Progressing   Problem: Cardiovascular: Goal: Ability to achieve and maintain adequate cardiovascular perfusion will improve 12/31/2022 1811 by Conley Simmonds, RN Outcome: Progressing 12/31/2022 1741 by Conley Simmonds, RN Outcome: Progressing Goal: Vascular access site(s) Level 0-1 will be maintained 12/31/2022 1811 by Conley Simmonds, RN Outcome: Progressing 12/31/2022 1741 by Conley Simmonds, RN Outcome: Progressing   Problem: Health Behavior/Discharge Planning: Goal: Ability to safely manage health-related needs after discharge will improve 12/31/2022 1811 by Conley Simmonds, RN Outcome: Progressing 12/31/2022 1741 by Conley Simmonds, RN Outcome: Progressing   Problem: Education: Goal: Knowledge of disease or condition will improve 12/31/2022 1811 by Conley Simmonds, RN Outcome: Progressing 12/31/2022 1741 by Conley Simmonds, RN Outcome: Progressing Goal: Knowledge of secondary prevention will improve (MUST DOCUMENT ALL) 12/31/2022 1811 by Conley Simmonds, RN Outcome: Progressing 12/31/2022 1741 by Conley Simmonds, RN Outcome: Progressing Goal: Knowledge of patient specific risk factors will improve Loraine Leriche N/A or DELETE if not current risk factor) 12/31/2022 1811 by Conley Simmonds, RN Outcome:  Progressing 12/31/2022 1741 by Conley Simmonds, RN Outcome: Progressing   Problem: Ischemic Stroke/TIA Tissue Perfusion: Goal: Complications of ischemic stroke/TIA will be minimized 12/31/2022 1811 by Conley Simmonds, RN Outcome: Progressing 12/31/2022 1741 by Conley Simmonds, RN Outcome: Progressing   Problem: Coping: Goal: Will verbalize positive feelings about self 12/31/2022 1811 by Conley Simmonds, RN Outcome: Progressing 12/31/2022 1741 by Conley Simmonds, RN Outcome: Progressing Goal: Will identify appropriate support needs 12/31/2022 1811 by Conley Simmonds, RN Outcome: Progressing 12/31/2022 1741 by Conley Simmonds, RN Outcome: Progressing   Problem: Health Behavior/Discharge Planning: Goal: Ability to manage health-related needs will improve 12/31/2022 1811 by Conley Simmonds, RN Outcome: Progressing 12/31/2022 1741 by Conley Simmonds, RN Outcome: Progressing Goal: Goals will be collaboratively established with patient/family 12/31/2022 1811 by Conley Simmonds, RN Outcome: Progressing 12/31/2022 1741 by Conley Simmonds, RN Outcome: Progressing   Problem: Self-Care: Goal: Ability to participate in self-care as condition permits will improve 12/31/2022 1811 by Conley Simmonds, RN Outcome: Progressing 12/31/2022 1741 by Conley Simmonds, RN Outcome: Progressing Goal: Verbalization of feelings and concerns over difficulty with self-care will improve 12/31/2022 1811 by Conley Simmonds, RN Outcome: Progressing 12/31/2022 1741 by Conley Simmonds, RN Outcome: Progressing Goal: Ability to communicate needs accurately will improve 12/31/2022 1811 by Conley Simmonds, RN Outcome: Progressing 12/31/2022 1741 by Conley Simmonds, RN Outcome: Progressing   Problem: Nutrition: Goal: Risk of aspiration will decrease 12/31/2022 1811 by Conley Simmonds, RN Outcome: Progressing 12/31/2022 1741 by Conley Simmonds, RN Outcome:  Progressing Goal: Dietary intake will improve 12/31/2022 1811 by Conley Simmonds, RN Outcome: Progressing 12/31/2022 1741 by Conley Simmonds, RN Outcome: Progressing

## 2023-01-01 ENCOUNTER — Inpatient Hospital Stay (HOSPITAL_COMMUNITY): Payer: Medicare Other

## 2023-01-01 DIAGNOSIS — I4891 Unspecified atrial fibrillation: Secondary | ICD-10-CM | POA: Diagnosis not present

## 2023-01-01 LAB — BLOOD CULTURE ID PANEL (REFLEXED) - BCID2

## 2023-01-01 LAB — BASIC METABOLIC PANEL
Anion gap: 8 (ref 5–15)
BUN: 22 mg/dL (ref 8–23)
CO2: 16 mmol/L — ABNORMAL LOW (ref 22–32)
Calcium: 8.6 mg/dL — ABNORMAL LOW (ref 8.9–10.3)
Chloride: 111 mmol/L (ref 98–111)
Creatinine, Ser: 1.63 mg/dL — ABNORMAL HIGH (ref 0.61–1.24)
GFR, Estimated: 42 mL/min — ABNORMAL LOW (ref >60)
Glucose, Bld: 79 mg/dL (ref 70–99)
Potassium: 4.5 mmol/L (ref 3.5–5.1)
Sodium: 135 mmol/L (ref 135–145)

## 2023-01-01 LAB — PHOSPHORUS: Phosphorus: 2.6 mg/dL (ref 2.5–4.6)

## 2023-01-01 LAB — GLUCOSE, CAPILLARY
Glucose-Capillary: 117 mg/dL — ABNORMAL HIGH (ref 70–99)
Glucose-Capillary: 61 mg/dL — ABNORMAL LOW (ref 70–99)
Glucose-Capillary: 91 mg/dL (ref 70–99)
Glucose-Capillary: 87 mg/dL (ref 70–99)
Glucose-Capillary: <10 mg/dL — CL (ref 70–99)
Glucose-Capillary: 175 mg/dL — ABNORMAL HIGH (ref 70–99)
Glucose-Capillary: 96 mg/dL (ref 70–99)
Glucose-Capillary: 101 mg/dL — ABNORMAL HIGH (ref 70–99)

## 2023-01-01 LAB — MAGNESIUM
Magnesium: 2.1 mg/dL (ref 1.7–2.4)
Magnesium: 2 mg/dL (ref 1.7–2.4)

## 2023-01-01 LAB — CBC
HCT: 26.7 % — ABNORMAL LOW (ref 39.0–52.0)
Hemoglobin: 8.1 g/dL — ABNORMAL LOW (ref 13.0–17.0)
MCH: 29.3 pg (ref 26.0–34.0)
MCHC: 30.3 g/dL (ref 30.0–36.0)
MCV: 96.7 fL (ref 80.0–100.0)
Platelets: 120 10*3/uL — ABNORMAL LOW (ref 150–400)
RBC: 2.76 MIL/uL — ABNORMAL LOW (ref 4.22–5.81)
RDW: 15.9 % — ABNORMAL HIGH (ref 11.5–15.5)
WBC: 11.3 10*3/uL — ABNORMAL HIGH (ref 4.0–10.5)
nRBC: 0 % (ref 0.0–0.2)

## 2023-01-01 MED ORDER — DEXTROSE 50 % IV SOLN
INTRAVENOUS | Status: AC
  Administered 2023-01-01: 25 mL
  Filled 2023-01-01: qty 50

## 2023-01-01 MED ORDER — MIDAZOLAM HCL 2 MG/2ML IJ SOLN
INTRAMUSCULAR | Status: AC
  Administered 2023-01-01: 1 mg via INTRAVENOUS
  Filled 2023-01-01: qty 2

## 2023-01-01 MED ORDER — DEXTROSE 10 % IV SOLN: INTRAVENOUS | Status: DC

## 2023-01-01 MED ORDER — MIDAZOLAM HCL 2 MG/2ML IJ SOLN: 1.0000 mg | Freq: Once | INTRAMUSCULAR | Status: AC

## 2023-01-01 MED ORDER — DEXTROSE 50 % IV SOLN
25.0000 g | INTRAVENOUS | Status: AC
  Administered 2023-01-01: 25 g via INTRAVENOUS

## 2023-01-01 MED ORDER — FENTANYL CITRATE PF 50 MCG/ML IJ SOSY
PREFILLED_SYRINGE | INTRAMUSCULAR | Status: AC
  Administered 2023-01-01: 25 ug via INTRAVENOUS
  Filled 2023-01-01: qty 1

## 2023-01-01 MED ORDER — DEXTROSE 50 % IV SOLN
INTRAVENOUS | Status: AC
  Filled 2023-01-01: qty 50

## 2023-01-01 MED ORDER — FENTANYL CITRATE PF 50 MCG/ML IJ SOSY: 25.0000 ug | PREFILLED_SYRINGE | Freq: Once | INTRAMUSCULAR | Status: AC

## 2023-01-01 MED ORDER — PROSOURCE TF20 ENFIT COMPATIBL EN LIQD
60.0000 mL | Freq: Every day | ENTERAL | Status: DC
  Administered 2023-01-01 – 2023-01-10 (×9): 60 mL
  Filled 2023-01-01 (×9): qty 60

## 2023-01-01 MED ORDER — HYALURONIDASE HUMAN 150 UNIT/ML IJ SOLN
150.0000 [IU] | Freq: Once | INTRAMUSCULAR | Status: AC
  Administered 2023-01-01: 150 [IU] via SUBCUTANEOUS
  Filled 2023-01-01: qty 1

## 2023-01-01 MED ORDER — VITAL AF 1.2 CAL PO LIQD
1000.0000 mL | ORAL | Status: DC
  Administered 2023-01-01 (×2): 1000 mL
  Filled 2023-01-01: qty 1000

## 2023-01-01 MED ORDER — METOPROLOL TARTRATE 50 MG PO TABS
50.0000 mg | ORAL_TABLET | Freq: Two times a day (BID) | ORAL | Status: DC
  Administered 2023-01-01 – 2023-01-02 (×3): 50 mg via ORAL
  Filled 2023-01-01 (×2): qty 2
  Filled 2023-01-01: qty 1

## 2023-01-01 MED ORDER — DEXTROSE 20 % IV SOLN
INTRAVENOUS | Status: DC
  Filled 2023-01-01 (×5): qty 500

## 2023-01-01 MED ORDER — DEXTROSE 5 % IV SOLN: INTRAVENOUS | Status: DC

## 2023-01-01 NOTE — Progress Notes (Signed)
PT Cancellation Note  Patient Details Name: Matthew Zamora MRN: [id number] DOB: [birth year]   Cancelled Treatment:    Reason Eval/Treat Not Completed: (P) Patient declined, no reason specified;Medical issues which prohibited therapy, pt declining mobility stating he "just doesn't feel good",  also HR tachy at 119bpm at rest. Will hold therapy this date and follow up tomorrow to continue PT POC.   Lenora Boys. PTA Acute Rehabilitation Services Office: [phone]    Catalina Antigua 01/01/2023, 2:55 PM

## 2023-01-01 NOTE — Progress Notes (Signed)
NAME:  Matthew Zamora, MRN:  [id number], DOB:  11-Dec-1940, LOS: 2 ADMISSION DATE:  12/27/2022, CONSULTATION DATE: 7/14 REFERRING MD:  Clide Dales, CHIEF COMPLAINT:   MRSA bacteremia with septic shock And acute left internal carotid artery thrombus   History of Present Illness:  82 year old male patient who presented to the emergency room at a [facility] with acute onset of shortness of breath.  Denied any chest pain, no change in medications.  After diagnostic evaluation in the ER he was initially admitted with working diagnosis of acute on chronic heart failure with associated respiratory failure complicated further by atrial fibrillation with RVR .  He was treated with IV diuresis as well as calcium channel blockade course further complicated later following admission by hypotension requiring fluid resuscitation on 7/13 he continued to have atrial fibrillation with RVR he was started on amiodarone infusion and cardiology was consulted.  He also spiked fever at which time cultures and respiratory viral panel were sent and he was empirically started on cefepime and vancomycin.  He was moved to the intensive care.  Amiodarone later placed on hold due to hypotension with resuscitation focus changed to volume resuscitation.  BC ID panel was positive for Staph aureus/MRSA from blood cultures.  Respiratory viral panel positive for rhinovirus.  On 7/14 a code stroke was called around 8:30 in the morning when he developed sudden onset of right-sided weakness and aphasia as well as some agitation.  A stat CT angiogram was obtained this showed nearly occlusive ICA thrombus on the left.  Neurology and vascular surgery were consulted he was started on norepinephrine infusion to maintain mean arterial blood pressure and transferred to [facility] for neuro interventional radiology intervention critical care asked to assume primary care  Significant Hospital Events: Including procedures, antibiotic start and stop dates in  addition to other pertinent events   7/12 admitted [facility] with shortness of breath and initially a working diagnosis of acute decompensated heart failure 7/13 new fevers.  Blood cultures positive for MRSA antibiotic started, Vanco and cefepime hypotensive.  Having trouble with atrial fibrillation with RVR initially started on amiodarone which was subsequently discontinued 7/14 acute neurological decompensation.  CT angiogram showed partially occlusive thrombus in the left ICA.  Transferred to [facility] for neuro interventional radiology consultation and treatment.  Echocardiogram shows global hypokinesis worse in the septum LVEF down to 30 to 35% left ventricular hypertrophy diastolic parameters indeterminate RV size normal no mention of vegetation. Transferred to [facility] where he underwent  complete revascularization of the  proximal left internal carotid artery with 1 pass with a 6 mm x 47 embotrap  retrieval device with contact aspiration, and subsequent stent assisted angioplasty placed on Cangrelor for 4 hours. Transferred to ICU post op   Interim History / Subjective:  Overnight: Blood culture grew 1 out of 4 bottles with MRSA.  Patient evaluated at bedside this morning.  Patient does not have much concern this morning.  He states he rested fine.  He denies any concerns.  He does not want to eat.   Objective   Blood pressure 134/81, pulse (!) 113, temperature (!) 97.4 F (36.3 C), temperature source Oral, resp. rate (!) 21, weight 73 kg, SpO2 100%.  Temperature afebrile, pulse ranging from 110s to 120s, currently at 98 still in A-fib, respirations 13-20, with maps in the 80s off Levophed.  Still on amiodarone.  Satting at 100% on 2 L nasal cannula.        Intake/Output Summary (Last 24 hours)  at 01/01/2023 0856 Last data filed at 01/01/2023 0800 Gross per 24 hour  Intake 2322.6 ml  Output 450 ml  Net 1872.6 ml   Filed Weights   12/29/22 1645 12/30/22 0500 01/01/23 0500  Weight: 67.2 kg 70.5  kg 73 kg    Examination: General: Chronically ill, resting in bed, no acute distress HENT: Asymmetric pupils with right 3 mm, left 2 mm, both reactive to light, EOMI Lungs: Bilaterally wheezing appreciated Cardiovascular: Irregularly irregular rhythm, no murmurs, rubs, or gallops Abdomen: Soft, nontender, nondistended Extremities: Left shoulder with minimal range of motion on abduction secondary to pain Skin: Right underarm with extravasation noted Neuro: Decreased strength noted on right lower extremity on hip flexion.  Upper extremities with decreased range of motion secondary to pain.  Labs reviewed: Glucose 61-117 Magnesium 2.1 BMP: Sodium 135, potassium 4.5, bicarb 16 down from 18, creatinine 1.63, anion gap 8 CBC: White count 11.3 (9.4), hemoglobin 8.1, platelet 120  Blood cultures: 1 out of 4 bottles growing MRSA  CT thoracic spine and and CT cervical spine 1. No CT findings suggest discitis. 2. No acute displaced fracture or traumatic listhesis of the cervical and thoracic spine. 3. Multilevel moderate degenerative changes spine. Associated severe osseous neural foraminal stenosis at the left C6-C7 level. 4. Interval development of poorly visualized nodular like right upper lobe pulmonary opacity. Correlate with chest x-ray. 5. Aortic Atherosclerosis (ICD10-I70.0) and Emphysema (ICD10-J43.9). 6. Cholelithiasis. 7. Left carotid artery stent within the neck. Incomplete evaluation on this noncontrast study.    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  #Left ICA thrombus, s/p thrombectomy and left ICA stent placement Patient is continuously improving.  Patient is  able to follow instructions.  Still having decreased right lower extremity strength on hip flexion.  Sensation intact.  Patient has been cleared by speech.  Neurology following, did state that he can be cleared for anticoagulation and antiplatelets.  Spoke with neurology and IR, and plan is for Brilinta and Eliquis.  Will continue to monitor. -Continue Eliquis 5 mg twice daily -Continue atorvastatin 80 mg daily -Continue Brilinta 90 mg twice daily -Neurology following, appreciate recommendation -IR following, appreciate recommendations -Frequent neurochecks -PT/OT following  #MRSA bacteremia, source from cardiac device #Septic shock, resolved Repeat blood culture still growing MRSA overnight.  ID following.  Current source could be from cardiac device.  Today will be day 4 of vancomycin.  Cardiology following.  Did obtain CT cervical spine and thoracic spine to evaluate for discitis, but no evidence of discitis per imaging.  -Cardiology following, appreciate recommendations -ID following, appreciate recommendation -Continue vancomycin day 4 -Patient might benefit from TEE if he starts improving -Might need device extraction, but not candidate at this time per Cardiology   #Combined systolic and diastolic heart failure #Elevated troponin No acute concern for exacerbation.  Patient seems euvolemic and does not need diuresis at this time.  -Patient is up to 5 L during hospitalization, will stop fluids today -Continue Lipitor 80 mg daily -Increase metoprolol tartrate 50 mg twice daily -Once patient is stable on metoprolol, can potentially transition to succinate as patient does have decreased ejection fraction   #Atrial fibrillation with RVR Will discontinue amiodarone today.  Rate still  in the 110s.  Will increase metoprolol tartrate  50 mg BID. Spoke with IR and neurology, and plan has been made to anticoagulate with Eliquis.  Will stop aspirin.  And keep Brilinta. -Continue Brilinta 90 mg  twice daily -Continue Eliquis 5 mg twice daily -Continue to monitor on telemetry -Increase metoprolol tartrate to 50 mg twice daily  #Left shoulder pain #Severe C6-C7 neuroforaminal stenosis Severe C6-C7 foraminal stenosis on the left cervical spine.  No evidence of discitis seen on CT.  CK normal.  -Physical therapy -Oxycodone every 6 hours as needed -Lidocaine patch, Voltaren gel -Dilaudid as needed   #Acute on chronic CKD stage IIIb #NAGMA #Hyponatremia, improving Non-anion gap metabolic acidosis in the setting of NaCl infusion.  Will discontinue infusion today as patient is up 5 L during hospitalization.  Urine output has been well.  Creatinine improving. -Monitor BMP -Monitor creatinine -Monitor urine output  #Normocytic anemia, likely anemia of chronic disease Hemoglobin stable at 8.1 this morning.  No acute concerns for bleeding. -Continue to monitor CBC   #Thrombocytopenia Platelets improved to 120.  No acute concerns. -Monitor platelet count   #GERD -On home Protonix 40 mg twice daily -Continue home Carafate  #Chronic hypotension -Continue home midodrine 30 mg 3 times daily -Continue home Mestinon 60 mg every 8 hours  #Depression/anxiety -Continue home sertraline 25 mg daily   #Acute metabolic encephalopathy secondary to sepsis, improved -No acute concerns -Continue to monitor mental status   Best Practice (right click and "Reselect all SmartList Selections" daily)   Diet/type: NPO DVT prophylaxis: DOAC GI prophylaxis: N/A Lines: N/A Foley:  N/A Code Status:  full code Last date of multidisciplinary goals of care discussion [pending]  Labs   CBC: Recent Labs  Lab 12/28/22 0409 12/29/22 0836 12/30/22 0015 12/30/22 0322 12/31/22 0519 01/01/23 0113  WBC 20.6* 12.9*  --  9.0 9.4 11.3*  HGB 8.7* 8.4* 12.9* 7.4* 7.3* 8.1*  HCT 26.8* 26.2* 38.0* 22.7* 23.2* 26.7*  MCV 91.8 92.6  --  92.3 91.7 96.7  PLT 141* 109*  --  92*  89* 101* 120*     Basic Metabolic Panel: Recent Labs  Lab 12/27/22 1216 12/28/22 0409 12/29/22 0836 12/29/22 2233 12/30/22 0015 12/30/22 0322 12/31/22 0519 01/01/23 0113  NA 138   < > 132* 137 136 132* 134* 135  K 4.2   < > 3.7 3.7 3.5 3.8 3.7 4.5  CL 105   < > 102 105  --  106 109 111  CO2 25   < > 21* 20*  --  18* 18* 16*  GLUCOSE 108*   < > 97 91  --  121* 81 79  BUN 19   < > 39* 33*  --  31* 24* 22  CREATININE 1.54*   < > 2.12* 1.86*  --  1.73* 1.75* 1.63*  CALCIUM 9.4   < > 7.7* 8.1*  --  7.9* 8.4* 8.6*  MG 1.9  --   --  1.6*  --  2.1 2.1 2.1  PHOS  --   --   --  3.0  --  2.9  --   --    < > = values in this interval not displayed.   GFR: Estimated Creatinine Clearance: 35 mL/min (A) (by C-G formula based on SCr of 1.63 mg/dL (H)). Recent Labs  Lab 12/28/22 1340 12/29/22 0836 12/30/22 0322 12/31/22 0519 01/01/23 0113  PROCALCITON  --  5.58  --   --   --  WBC  --  12.9* 9.0 9.4 11.3*  LATICACIDVEN 1.6  --   --   --   --     Liver Function Tests: Recent Labs  Lab 12/27/22 1216 12/28/22 0409 12/29/22 0836  AST 25 22 28   ALT 25 21 20   ALKPHOS 81 66 53  BILITOT 0.6 0.9 0.9  PROT 6.2* 5.7* 5.1*  ALBUMIN 3.5 3.3* 2.8*   No results for input(s): "LIPASE", "AMYLASE" in the last 168 hours. No results for input(s): "AMMONIA" in the last 168 hours.  ABG    Component Value Date/Time   PHART 7.339 (L) 12/30/2022 0015   PCO2ART 35.1 12/30/2022 0015   PO2ART 133 (H) 12/30/2022 0015   HCO3 18.8 (L) 12/30/2022 0015   TCO2 20 (L) 12/30/2022 0015   ACIDBASEDEF 6.0 (H) 12/30/2022 0015   O2SAT 99 12/30/2022 0015     Coagulation Profile: Recent Labs  Lab 12/30/22 0322  INR 1.4*    Cardiac Enzymes: Recent Labs  Lab 12/31/22 0924  CKTOTAL 92    HbA1C: Hgb A1c MFr Bld  Date/Time Value Ref Range Status  12/29/2022 06:24 PM 5.4 4.8 - 5.6 % Final    Comment:    (NOTE) Pre diabetes:          5.7%-6.4%  Diabetes:              >6.4%  Glycemic control for    <7.0% adults with diabetes   06/12/2021 06:00 PM 5.1 4.8 - 5.6 % Final    Comment:    (NOTE) Pre diabetes:          5.7%-6.4%  Diabetes:              >6.4%  Glycemic control for   <7.0% adults with diabetes     CBG: Recent Labs  Lab 12/31/22 1944 12/31/22 2340 01/01/23 0342 01/01/23 0420 01/01/23 0723  GLUCAP 88 86 61* 117* 91    Review of Systems:   Not able   Past Medical History:  He,  has a past medical history of Abdominal aortic aneurysm (AAA) ([name]) (05/13/15), Adenomatous colon polyp (03/18/2001, 03/14/2009, 10/06/2014), Anemia, 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 pylori infection, History of hepatitis, Hyperlipidemia, Hypertension, Liver cyst (05/16/15), PAF (paroxysmal atrial fibrillation) ([name]), and Prostate CA ([name]).   Surgical History:   Past Surgical History:  Procedure Laterality Date   CATARACT EXTRACTION     COLONOSCOPY  10/06/2014, 09/18/2004, 03/14/2009   ESOPHAGOGASTRODUODENOSCOPY  10/06/2014, 03/18/2001, 03/14/2009   ESOPHAGOGASTRODUODENOSCOPY (EGD) WITH PROPOFOL N/A 05/07/2018   Procedure: ESOPHAGOGASTRODUODENOSCOPY (EGD) WITH PROPOFOL;  Surgeon: Toledo, Boykin Nearing, MD;  Location: [facility] ENDOSCOPY;  Service: Gastroenterology;  Laterality: N/A;   ESOPHAGOGASTRODUODENOSCOPY (EGD) WITH PROPOFOL N/A 04/22/2022   Procedure: ESOPHAGOGASTRODUODENOSCOPY (EGD) WITH PROPOFOL;  Surgeon: Toney Reil, MD;  Location: [facility] ENDOSCOPY;  Service: Endoscopy;  Laterality: N/A;   FLEXIBLE SIGMOIDOSCOPY  08/26/1990   HIP ARTHROPLASTY Right 10/31/2022   Procedure: ARTHROPLASTY BIPOLAR HIP (HEMIARTHROPLASTY)-RNFA;  Surgeon: Reinaldo Berber, MD;  Location: [facility] ORS;  Service: Orthopedics;  Laterality: Right;   INSERTION OF ICD     IR CT HEAD LTD  12/29/2022   IR CT HEAD LTD  12/29/2022   IR PERCUTANEOUS ART  THROMBECTOMY/INFUSION INTRACRANIAL INC DIAG ANGIO  12/29/2022   PROSTATE SURGERY     RADIOLOGY WITH ANESTHESIA N/A 12/29/2022   Procedure: IR WITH ANESTHESIA;  Surgeon: Radiologist, Medication, MD;  Location: [facility]  OR;  Service: Radiology;  Laterality: N/A;   TONSILLECTOMY       Social History:   reports that he quit smoking about 23 years ago. His smoking use included cigarettes. He started smoking about 61 years ago. He has a 38 pack-year smoking history. He has never used smokeless tobacco. He reports that he does not currently use drugs. He reports that he does not drink alcohol.   Family History:  His family history includes Heart attack in his father and mother.   Allergies Allergies  Allergen Reactions   Nitrofuran Derivatives Other (See Comments)    Transaminitis ** confounded w/Amiodarone, Mexiletine   Amiodarone Other (See Comments)    Unknown    Spironolactone Other (See Comments)    Significant transaminitis      Home Medications  Prior to Admission medications   Medication Sig Start Date End Date Taking? Authorizing Provider  acetaminophen (TYLENOL) 325 MG tablet Take 2 tablets (650 mg total) by mouth every 4 (four) hours as needed for headache or mild pain. 10/29/19   Judithe Modest, NP  albuterol (PROVENTIL) (2.5 MG/3ML) 0.083% nebulizer solution Inhale 3 mLs (2.5 mg total) into the lungs as needed for shortness of breath. 10/29/19   Judithe Modest, NP  ascorbic acid (VITAMIN C) 500 MG tablet Take 500 mg by mouth daily.    [provider]  atorvastatin (LIPITOR) 80 MG tablet Take 80 mg by mouth daily. 05/13/21   [provider]  calcium carbonate (TUMS EX) 750 MG chewable tablet Chew 1 tablet by mouth daily.    [provider]  ELIQUIS 2.5 MG TABS tablet Take 2.5 mg by mouth 2 (two) times daily. 12/28/21   [provider]  fluticasone-salmeterol (ADVAIR) 500-50 MCG/ACT AEPB Inhale 1 puff into the lungs in the morning and at bedtime.  04/05/22   [provider]  gabapentin (NEURONTIN) 100 MG capsule Take 100 mg by mouth at bedtime. 12/09/19   [provider]  guaiFENesin-dextromethorphan (ROBITUSSIN DM) 100-10 MG/5ML syrup Take 5 mLs by mouth every 4 (four) hours as needed for cough. 11/18/22   Enedina Finner, MD  magnesium oxide (MAG-OX) 400 (240 Mg) MG tablet Take 1 tablet by mouth daily. 02/05/21   [provider]  melatonin 3 MG TABS tablet Take 9 mg by mouth at bedtime.    [provider]  metoCLOPramide (REGLAN) 5 MG tablet Take 1 tablet (5 mg total) by mouth every 8 (eight) hours as needed for refractory nausea / vomiting. 05/13/22   Delfino Lovett, MD  metoprolol tartrate 37.5 MG TABS Take 1 tablet (37.5 mg total) by mouth 2 (two) times daily. 12/20/22 01/19/23  Lucile Shutters, MD  midodrine (PROAMATINE) 5 MG tablet Take 1 tablet (5 mg total) by mouth 3 (three) times daily with meals. Patient taking differently: Take 30 mg by mouth 3 (three) times daily with meals. 11/18/22   Enedina Finner, MD  Multiple Vitamin (MULTIVITAMIN WITH MINERALS) TABS tablet Take 1 tablet by mouth daily. 10/29/19   Judithe Modest, NP  Nebulizer MISC 1 each by Does not apply route as needed. 06/16/21   Rolly Salter, MD  Nutritional Supplements (,FEEDING SUPPLEMENT, PROSOURCE PLUS) liquid Take 30 mLs by mouth 3 (three) times daily between meals. 05/13/22   Delfino Lovett, MD  ondansetron (ZOFRAN-ODT) 4 MG disintegrating tablet Take 4 mg by mouth every 8 (eight) hours as needed for refractory nausea / vomiting, vomiting or nausea.    [provider]  pantoprazole (  PROTONIX) 40 MG tablet Take 1 tablet (40 mg total) by mouth 2 (two) times daily. 05/13/22   Delfino Lovett, MD  polyethylene glycol (MIRALAX / GLYCOLAX) 17 g packet Take 17 g by mouth 2 (two) times daily. 11/07/22   Leeroy Bock, MD  pyridostigmine (MESTINON) 60 MG tablet Take 1 tablet (60 mg total) by mouth every 8 (eight) hours. 05/13/22   Delfino Lovett, MD   sertraline (ZOLOFT) 25 MG tablet Take 25 mg by mouth daily.    [provider]  sucralfate (CARAFATE) 1 GM/10ML suspension Take 10 mLs (1 g total) by mouth 4 (four) times daily -  with meals and at bedtime. 05/13/22   Delfino Lovett, MD  tamsulosin (FLOMAX) 0.4 MG CAPS capsule Take 0.4 mg by mouth daily. 12/09/19   [provider]     Critical care time: 35 mins    Modena Slater, [facility] Internal Medicine Resident PGY-2 [phone] Pager # [phone] OR # [phone] if no answer

## 2023-01-01 NOTE — TOC Initial Note (Signed)
Transition of Care [facility]) - Initial/Assessment Note    Patient Details  Name: Matthew Zamora MRN: [id number] Date of Birth: 1940/06/22  Transition of Care [facility]) CM/SW Contact:    Lorri Frederick, LCSW Phone Number: 01/01/2023, 3:53 PM  Clinical Narrative:  Pt oriented x2.  CSW spoke with DIL [name] for initial assessment. She is married to son [name], has been assisting with pt.  Son [name] works out of town and is hard to reach during the day.   Pt is from home alone, does have current HH with [facility].  Discussed current PT recommendation for SNF.  Pt has been to [facility] in [place] before and this would be the choice if plan is for SNF at [place].            FL2 completed.  Referral has not been made to [facility] pending medical readiness.            Expected Discharge Plan: Skilled Nursing Facility Barriers to Discharge: Continued Medical Work up, SNF Pending bed offer   Patient Goals and CMS Choice     Choice offered to / list presented to : Adult Children (DIL [place])      Expected Discharge Plan and Services     Post Acute Care Choice: Skilled Nursing Facility Living arrangements for the past 2 months: Single Family Home                                      Prior Living Arrangements/Services Living arrangements for the past 2 months: Single Family Home Lives with:: Self Patient language and need for interpreter reviewed:: Yes        Need for Family Participation in Patient Care: Yes (Comment) Care giver support system in place?: Yes (comment)   Criminal Activity/Legal Involvement Pertinent to Current Situation/Hospitalization: No - Comment as needed  Activities of Daily Living      Permission Sought/Granted                  Emotional Assessment Appearance:: Appears stated age Attitude/Demeanor/Rapport: Unable to Assess Affect (typically observed): Unable to Assess        Admission diagnosis:  MRSA bacteremia [R78.81, B95.62] 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, left [facility]) [I63.232] Patient Active Problem List   Diagnosis Date Noted   Occlusive thrombus 12/29/2022   MRSA bacteremia 12/29/2022   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 coli 04/29/2022   Diarrhea 04/28/2022   Multifocal pneumonia 04/23/2022   Erosive esophagitis 04/22/2022   Dizziness 04/21/2022   Intractable nausea and vomiting 04/21/2022   Dyspnea 04/20/2022   Hyponatremia 04/19/2022   Orthostatic hypotension 04/18/2022   Postural dizziness with near syncope 04/18/2022   Near syncope 04/17/2022   COPD (chronic obstructive pulmonary disease) ([name]) 04/17/2022   Hyperkalemia 04/17/2022   Abnormal LFTs 04/17/2022   Chronic systolic CHF (congestive heart failure) ([name]) 04/17/2022   Normocytic anemia 04/17/2022   GERD without esophagitis 02/12/2022   Recurrent falls    Generalized weakness  NSVT (nonsustained ventricular tachycardia) ([name]) 02/11/2022   Acute on chronic combined systolic (congestive) and diastolic (congestive) heart failure ([name]) 08/10/2021   Acute respiratory failure with hypoxia ([name])    HCAP (healthcare-associated pneumonia) 06/13/2021   Acute on chronic systolic CHF (congestive heart failure) ([name]) 06/13/2021   Elevated troponin 06/13/2021   CKD (chronic kidney disease), 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   Lung nodule 06/13/2021   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   COPD exacerbation ([name]) 06/13/2021   Volume depletion 12/15/2019   Hypotension 12/15/2019   Cardiorenal syndrome    Atrial  fibrillation with RVR ([name])    Defibrillator discharge    Tachycardia 10/25/2019   CHF (congestive heart failure) ([name]) 09/18/2019   Acute on chronic combined systolic and diastolic CHF (congestive heart failure) ([name]) 09/17/2019   CAD (coronary artery disease) 09/17/2019   Ischemic cardiomyopathy 09/17/2019   CKD (chronic kidney disease) stage 3, GFR 30-59 ml/min ([name]) 09/17/2019   Acute hypoxemic respiratory failure ([name]) 09/17/2018   AAA (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 ([name]) 07/06/2018   Essential hypertension 07/06/2018   Hyperlipidemia 07/06/2018   Paroxysmal atrial fibrillation ([name])    Acute on chronic respiratory failure with hypoxia ([name]) 04/26/2018   PCP:  Jerl Mina, MD Pharmacy:   [facility] [address]), [place] - [address] [place]) [place] [address] Phone: [phone] Fax: [phone]     Social Determinants of Health (SDOH) Social History: SDOH Screenings   Food Insecurity: No Food Insecurity (12/17/2022)  Housing: Low Risk  (12/17/2022)  Transportation Needs: No Transportation Needs (12/17/2022)  Utilities: Not At Risk (12/17/2022)  Financial Resource Strain: Low Risk  (12/05/2021)   Received from [facility], [facility]  Tobacco Use: Medium Risk (12/17/2022)   SDOH Interventions:     Readmission Risk Interventions    12/28/2022    9:53 AM 12/18/2022    4:18 PM 04/22/2022    3:54 PM  Readmission Risk Prevention Plan  Transportation Screening Complete Complete Complete  Medication Review (RN Care Manager) Complete Complete Complete  PCP or Specialist appointment within 3-5 days of discharge Complete Complete Complete  HRI or Home Care Consult Complete Complete Complete  SW Recovery Care/Counseling Consult Complete Complete Complete  Palliative Care Screening Not Applicable Not Applicable Not Applicable  Skilled Nursing Facility Complete Not Applicable Not Applicable

## 2023-01-01 NOTE — Procedures (Signed)
Cortrak  Person Inserting Tube:  Alroy Dust, Makayla L, RD Tube Type:  Cortrak - 43 inches Tube Size:  10 Tube Location:  Left nare Secured by: Bridle Technique Used to Measure Tube Placement:  Marking at nare/corner of mouth Cortrak Secured At:  70 cm   Cortrak Tube Team Note:  Consult received to place a Cortrak feeding tube.   X-ray is required, abdominal x-ray has been ordered by the Cortrak team. Please confirm tube placement before using the Cortrak tube.   If the tube becomes dislodged please keep the tube and contact the Cortrak team at [email or web address] for replacement.  If after hours and replacement cannot be delayed, place a NG tube and confirm placement with an abdominal x-ray.    Hermina Barters RD, LDN Clinical Dietitian See Shea Evans for contact information.

## 2023-01-01 NOTE — Progress Notes (Addendum)
Hypoglycemic Event  CBG: <10  Treatment: D50 50 mL (25 gm)  Symptoms: Pale  Follow-up CBG: Time:1609 CBG Result:175  Possible Reasons for Event: Other: pt refuses to eat   Comments/MD notified: New orders in     [place]  Matthew Zamora

## 2023-01-01 NOTE — Progress Notes (Signed)
Brief Nutrition Note  Consult received for enteral/tube feeding initiation and management.  Adult Enteral Nutrition Protocol initiated. Full assessment to follow.  cortrak tube in place, xray imaging pending.   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 Pt meets criteria for normal based on current BMI.  Labs:  Recent Labs  Lab 12/29/22 2233 12/30/22 0015 12/30/22 0322 12/31/22 0519 01/01/23 0113  NA 137   < > 132* 134* 135  K 3.7   < > 3.8 3.7 4.5  CL 105  --  106 109 111  CO2 20*  --  18* 18* 16*  BUN 33*  --  31* 24* 22  CREATININE 1.86*  --  1.73* 1.75* 1.63*  CALCIUM 8.1*  --  7.9* 8.4* 8.6*  MG 1.6*  --  2.1 2.1 2.1  PHOS 3.0  --  2.9  --   --   GLUCOSE 91  --  121* 81 79   < > = values in this interval not displayed.    Greig Castilla, RD, LDN Clinical Dietitian RD pager # available in AMION  After hours/weekend pager # available in [facility]

## 2023-01-01 NOTE — NC FL2 (Signed)
[place] MEDICAID FL2 LEVEL OF CARE FORM     IDENTIFICATION  Patient Name: Matthew Zamora Birthdate: Apr 03, 1941 Sex: male Admission Date (Current Location): 12/29/2022  [facility] and [place] Number:  Producer, television/film/video and Address:  [facility], 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 coli 04/29/2022   Diarrhea 04/28/2022   Multifocal pneumonia 04/23/2022   Erosive esophagitis 04/22/2022   Dizziness 04/21/2022   Intractable nausea and vomiting 04/21/2022   Dyspnea 04/20/2022   Hyponatremia 04/19/2022   Orthostatic hypotension 04/18/2022   Postural dizziness with near syncope 04/18/2022   Near syncope 04/17/2022   COPD (chronic obstructive pulmonary disease) ([name]) 04/17/2022   Hyperkalemia 04/17/2022   Abnormal LFTs 04/17/2022   Chronic systolic CHF (congestive heart failure) ([name]) 04/17/2022   Normocytic anemia 04/17/2022   GERD without esophagitis 02/12/2022   Recurrent falls     Generalized weakness    NSVT (nonsustained ventricular tachycardia) ([name]) 02/11/2022   Acute on chronic combined systolic (congestive) and diastolic (congestive) heart failure ([name]) 08/10/2021   Acute respiratory failure with hypoxia ([name])    HCAP (healthcare-associated pneumonia) 06/13/2021   Acute on chronic systolic CHF (congestive heart failure) ([name]) 06/13/2021   Elevated troponin 06/13/2021   CKD (chronic kidney disease), 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   Lung nodule 06/13/2021   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   COPD exacerbation ([name]) 06/13/2021   Volume depletion 12/15/2019   Hypotension 12/15/2019   Cardiorenal syndrome    Atrial fibrillation with RVR ([name])    Defibrillator discharge    Tachycardia 10/25/2019   CHF (congestive heart failure) ([name]) 09/18/2019   Acute on chronic combined systolic and diastolic CHF (congestive heart failure) ([name]) 09/17/2019   CAD (coronary artery disease) 09/17/2019   Ischemic cardiomyopathy 09/17/2019   CKD (chronic kidney disease) stage 3, GFR 30-59 ml/min ([name]) 09/17/2019   Acute hypoxemic respiratory failure ([name]) 09/17/2018   AAA (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 ([name]) 07/06/2018   Essential hypertension 07/06/2018   Hyperlipidemia 07/06/2018   Paroxysmal atrial fibrillation ([name])    Acute on chronic respiratory failure with hypoxia ([name]) 04/26/2018    Orientation RESPIRATION BLADDER Height & Weight     Self, Place  O2 External catheter, Incontinent Weight: 160 lb 15 oz (73 kg) Height:     BEHAVIORAL SYMPTOMS/MOOD NEUROLOGICAL BOWEL NUTRITION STATUS      Continent Diet (see discharge summary)  AMBULATORY STATUS COMMUNICATION OF NEEDS Skin   Total Care Verbally Other (Comment) (redness/echymosis)  Personal Care Assistance Level of Assistance  Bathing, Feeding, Dressing Bathing Assistance: Maximum assistance Feeding assistance: Limited assistance Dressing Assistance: Maximum assistance     Functional  Limitations Info  Sight, Hearing, Speech Sight Info: Adequate Hearing Info: Impaired Speech Info: Adequate    SPECIAL CARE FACTORS FREQUENCY  PT (By licensed PT), OT (By licensed OT)     PT Frequency: 5x week OT Frequency: 5x week            Contractures Contractures Info: Not present    Additional Factors Info  Code Status, Allergies Code Status Info: full Allergies Info: Nitrofuran Derivatives, Amiodarone, Spironolactone           Current Medications (01/01/2023):  This is the current hospital active medication list Current Facility-Administered Medications  Medication Dose Route Frequency Provider Last Rate Last Admin   acetaminophen (TYLENOL) tablet 650 mg  650 mg Oral Q4H PRN Julieanne Cotton, MD   650 mg at 12/31/22 1147   Or   acetaminophen (TYLENOL) 160 MG/5ML solution 650 mg  650 mg Per Tube Q4H PRN Deveshwar, Simonne Maffucci, MD       Or   acetaminophen (TYLENOL) suppository 650 mg  650 mg Rectal Q4H PRN Deveshwar, Simonne Maffucci, MD       albuterol (PROVENTIL) (2.5 MG/3ML) 0.083% nebulizer solution 2.5 mg  2.5 mg Inhalation PRN Lorin Glass, MD   2.5 mg at 12/31/22 0836   ALPRAZolam Prudy Feeler) tablet 0.5 mg  0.5 mg Oral BID PRN Modena Slater, DO   0.5 mg at 12/31/22 1613   apixaban (ELIQUIS) tablet 5 mg  5 mg Oral BID Dang, Thuy D, RPH   5 mg at 01/01/23 1021   atorvastatin (LIPITOR) tablet 80 mg  80 mg Oral QHS Caryl Pina, MD   80 mg at 12/31/22 2123   Chlorhexidine Gluconate Cloth 2 % PADS 6 each  6 each Topical Q0600 Olalere, Adewale A, MD   6 each at 12/31/22 0943   dextrose 20 % infusion   Intravenous Continuous Olalere, Adewale A, MD       dextrose 50 % solution            diclofenac Sodium (VOLTAREN) 1 % topical gel 4 g  4 g Topical QID Modena Slater, DO   4 g at 01/01/23 1306   docusate sodium (COLACE) capsule 100 mg  100 mg Oral BID PRN Lorin Glass, MD       gabapentin (NEURONTIN) capsule 100 mg  100 mg Oral QHS Lorin Glass, MD   100 mg at 12/31/22 2124    hyaluronidase Human (HYLENEX) injection 150 Units  150 Units Subcutaneous Once Olalere, Adewale A, MD       HYDROmorphone (DILAUDID) injection 0.5 mg  0.5 mg Intravenous Q4H PRN Modena Slater, DO   0.5 mg at 01/01/23 1238   melatonin tablet 9 mg  9 mg Oral QHS Lorin Glass, MD   9 mg at 12/31/22 2124   metoprolol tartrate (LOPRESSOR) tablet 50 mg  50 mg Oral BID Modena Slater, DO   50 mg at 01/01/23 1028   midodrine (PROAMATINE) tablet 30 mg  30 mg Oral Q8H Olalere, Adewale A, MD   30 mg at 01/01/23 1356   multivitamin with minerals tablet 1 tablet  1 tablet Oral Daily Lorin Glass, MD   1 tablet at 01/01/23 1022   mupirocin ointment (BACTROBAN) 2 % 1 Application  1 Application Nasal BID Virl Diamond A, MD   1 Application at 01/01/23  1023   Oral care mouth rinse  15 mL Mouth Rinse PRN Caryl Pina, MD       oxyCODONE (Oxy IR/ROXICODONE) immediate release tablet 5 mg  5 mg Oral Q6H PRN Modena Slater, DO   5 mg at 01/01/23 0101   pantoprazole (PROTONIX) EC tablet 40 mg  40 mg Oral BID Lorin Glass, MD   40 mg at 01/01/23 1022   polyethylene glycol (MIRALAX / GLYCOLAX) packet 17 g  17 g Oral Daily PRN Lorin Glass, MD       pyridostigmine (MESTINON) tablet 60 mg  60 mg Oral Q8H Lorin Glass, MD   60 mg at 01/01/23 1356   sertraline (ZOLOFT) tablet 25 mg  25 mg Oral Daily Lorin Glass, MD   25 mg at 01/01/23 1022   sucralfate (CARAFATE) 1 GM/10ML suspension 1 g  1 g Oral TID WC & HS Lorin Glass, MD   1 g at 01/01/23 1238   ticagrelor (BRILINTA) tablet 90 mg  90 mg Oral BID Julieanne Cotton, MD   90 mg at 01/01/23 1023   Or   ticagrelor (BRILINTA) tablet 90 mg  90 mg Per Tube BID Julieanne Cotton, MD       vancomycin (VANCOCIN) IVPB 1000 mg/200 mL premix  1,000 mg Intravenous Q24H Lendon Ka, Student-PharmD   Stopped at 12/31/22 2244     Discharge Medications: Please see discharge summary for a list of discharge medications.  Relevant Imaging Results:  Relevant Lab  Results:   Additional Information SSN: [id number]  Lorri Frederick, LCSW

## 2023-01-01 NOTE — Progress Notes (Signed)
MD made aware of difference in pupils

## 2023-01-01 NOTE — Progress Notes (Signed)
Pt refuses to eat, MD made aware of pt refusal.

## 2023-01-01 NOTE — Progress Notes (Signed)
Daily Progress Note   Patient Name: Matthew Zamora       Date: 01/01/2023 DOB: [birth year]  Age: 82 y.o. MRN#: [id number] Attending Physician: Tomma Lightning, MD Primary Care Physician: Jerl Mina, MD Admit Date: 12/29/2022  Reason for Consultation/Follow-up: Establishing goals of care  Subjective: No specific complaints, doesn't want to eat - cannot tell my why. Declines protein shakes when I attempt to assist him.   Length of Stay: 3  Current Medications: Scheduled Meds:   apixaban  5 mg Oral BID   atorvastatin  80 mg Oral QHS   Chlorhexidine Gluconate Cloth  6 each Topical Q0600   diclofenac Sodium  4 g Topical QID   gabapentin  100 mg Oral QHS   melatonin  9 mg Oral QHS   metoprolol tartrate  50 mg Oral BID   midodrine  30 mg Oral Q8H   multivitamin with minerals  1 tablet Oral Daily   mupirocin ointment  1 Application Nasal BID   pantoprazole  40 mg Oral BID   pyridostigmine  60 mg Oral Q8H   sertraline  25 mg Oral Daily   sucralfate  1 g Oral TID WC & HS   ticagrelor  90 mg Oral BID   Or   ticagrelor  90 mg Per Tube BID    Continuous Infusions:  dextrose 50 mL/hr at 01/01/23 1306   vancomycin Stopped (12/31/22 2244)    PRN Meds: acetaminophen **OR** acetaminophen (TYLENOL) oral liquid 160 mg/5 mL **OR** acetaminophen, albuterol, ALPRAZolam, docusate sodium, HYDROmorphone (DILAUDID) injection, mouth rinse, oxyCODONE, polyethylene glycol  Physical Exam Constitutional:      General: He is not in acute distress.    Appearance: He is ill-appearing.  Pulmonary:     Effort: Pulmonary effort is normal.  Skin:    General: Skin is warm and dry.  Neurological:     Mental Status: He is alert.             Vital Signs: BP 115/83   Pulse (!) 117   Temp (!) 97.3 F (36.3  C) (Oral)   Resp 18   Wt 73 kg   SpO2 98%   BMI 24.47 kg/m  SpO2: SpO2: 98 % O2 Device: O2 Device: Nasal Cannula O2 Flow Rate: O2 Flow Rate (L/min): 2 L/min  Intake/output summary:  Intake/Output Summary (Last 24 hours) at 01/01/2023 1425 Last data filed at 01/01/2023 1300 Gross per 24 hour  Intake 1685.14 ml  Output 550 ml  Net 1135.14 ml   LBM: Last BM Date : 12/31/22 Baseline Weight: Weight: 67.2 kg Most recent weight: Weight: 73 kg       Palliative Assessment/Data: PPS 40%      Patient Active Problem List   Diagnosis Date Noted   Occlusive thrombus 12/29/2022   MRSA bacteremia 12/29/2022   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 coli 04/29/2022   Diarrhea 04/28/2022   Multifocal pneumonia 04/23/2022   Erosive esophagitis 04/22/2022   Dizziness 04/21/2022   Intractable nausea and vomiting 04/21/2022   Dyspnea 04/20/2022   Hyponatremia 04/19/2022   Orthostatic hypotension 04/18/2022   Postural dizziness with near syncope 04/18/2022   Near syncope 04/17/2022   COPD (chronic obstructive pulmonary disease) ([name]) 04/17/2022   Hyperkalemia 04/17/2022   Abnormal LFTs 04/17/2022   Chronic systolic CHF (congestive heart failure) ([name]) 04/17/2022   Normocytic anemia 04/17/2022   GERD without esophagitis 02/12/2022   Recurrent falls    Generalized weakness    NSVT (nonsustained ventricular tachycardia) ([name]) 02/11/2022   Acute on chronic combined systolic (congestive) and diastolic (congestive) heart failure ([name]) 08/10/2021    Acute respiratory failure with hypoxia ([name])    HCAP (healthcare-associated pneumonia) 06/13/2021   Acute on chronic systolic CHF (congestive heart failure) ([name]) 06/13/2021   Elevated troponin 06/13/2021   CKD (chronic kidney disease), 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   Lung nodule 06/13/2021   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   COPD exacerbation ([name]) 06/13/2021   Volume depletion 12/15/2019   Hypotension 12/15/2019   Cardiorenal syndrome    Atrial fibrillation with RVR ([name])    Defibrillator discharge    Tachycardia 10/25/2019   CHF (congestive heart failure) ([name]) 09/18/2019   Acute on chronic combined systolic and diastolic CHF (congestive heart failure) ([name]) 09/17/2019   CAD (coronary artery disease) 09/17/2019   Ischemic cardiomyopathy 09/17/2019   CKD (chronic kidney disease) stage 3, GFR 30-59 ml/min ([name]) 09/17/2019   Acute hypoxemic respiratory failure ([name]) 09/17/2018   AAA (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 ([name]) 07/06/2018   Essential hypertension 07/06/2018   Hyperlipidemia 07/06/2018   Paroxysmal atrial fibrillation ([name])    Acute on chronic respiratory failure with hypoxia ([name]) 04/26/2018    Palliative Care Assessment & Plan   HPI: 82 year old man with myriad of comorobidities including refractory orthostatic hypotension, HFrEF, chronic N/V who presented w/ SOB at [facility] act[facility]remic with MRSA as well has + for rhinovirus. (+( R sided weakness w/ L ICA thrombus transferred to [facility] for emergent thrombectomy.    Palliative care has been asked to get involved in the setting of multiple readmissions, high chronic disease burden, and declined health to further discuss goals of care.   Assessment: Follow up today with patient. He doesn't want to eat much today per RN. Upon my assessment he has food tray on bedside table. I offer to assist him with it but he declines. Also had protein shake on table. Offered him a sip of this but he also declines, he cannot explain why he doesn't want  to eat just states "I dont want to".  He has no complaints. He tells me to call his son [name].  Speak with [name]. Reviewed my assessment of patient today. We discuss bringing in some foods from home patient may find more preferable. Discussed with [name] blood cultures positive for MRSA again. Discussed concern of ICD vegetation. Discussed potential for TEE. Discussed at this time cardiology does not feel patient is candidate for device removal.   [name] shares his wife is NP and has been at bedside visiting and has spoken with physician - he feels they have good understanding of situation.  Goals are to continue current measures and allow time for outcomes.   Recommendations/Plan: Full code/full scope Time for outcomes Family to bring in food from home in attempt to increase intake Ongoing  PMT support   Care plan was discussed with patient's RN and son [name]  Thank you for allowing the Palliative Medicine Team to assist in the care of this patient.  *Please note that this is a verbal dictation therefore any spelling or grammatical errors are due to the "Dragon Medical One" system interpretation.  Gerlean Ren, DNP, [facility] Palliative Medicine Team Team Phone # [phone]  Pager [phone]

## 2023-01-01 NOTE — Progress Notes (Signed)
PHARMACY - PHYSICIAN COMMUNICATION CRITICAL VALUE ALERT - BLOOD CULTURE IDENTIFICATION (BCID)  Matthew Zamora is an 82 y.o. male who presented to [facility] on 12/29/2022 with a chief complaint of bacteremia  Assessment:  Repeat culture remains +  Name of physician (or Provider) Contacted: Dr Valora Piccolo  Current antibiotics: Vancomycin   Changes to prescribed antibiotics recommended:  Patient is on recommended antibiotics - No changes needed  Results for orders placed or performed during the hospital encounter of 12/29/22  Blood Culture ID Panel (Reflexed) (Collected: 12/31/2022  7:37 AM)  Result Value Ref Range   Enterococcus faecalis NOT DETECTED NOT DETECTED   Enterococcus Faecium NOT DETECTED NOT DETECTED   Listeria monocytogenes NOT DETECTED NOT DETECTED   Staphylococcus species DETECTED (A) NOT DETECTED   Staphylococcus aureus (BCID) DETECTED (A) NOT DETECTED   Staphylococcus epidermidis NOT DETECTED NOT DETECTED   Staphylococcus lugdunensis NOT DETECTED NOT DETECTED   Streptococcus species NOT DETECTED NOT DETECTED   Streptococcus agalactiae NOT DETECTED NOT DETECTED   Streptococcus pneumoniae NOT DETECTED NOT DETECTED   Streptococcus pyogenes NOT DETECTED NOT DETECTED   A.calcoaceticus-baumannii NOT DETECTED NOT DETECTED   Bacteroides fragilis NOT DETECTED NOT DETECTED   Enterobacterales NOT DETECTED NOT DETECTED   Enterobacter cloacae complex NOT DETECTED NOT DETECTED   Escherichia coli NOT DETECTED NOT DETECTED   Klebsiella aerogenes NOT DETECTED NOT DETECTED   Klebsiella oxytoca NOT DETECTED NOT DETECTED   Klebsiella pneumoniae NOT DETECTED NOT DETECTED   Proteus species NOT DETECTED NOT DETECTED   Salmonella species NOT DETECTED NOT DETECTED   Serratia marcescens NOT DETECTED NOT DETECTED   Haemophilus influenzae NOT DETECTED NOT DETECTED   Neisseria meningitidis NOT DETECTED NOT DETECTED   Pseudomonas aeruginosa NOT DETECTED NOT DETECTED   Stenotrophomonas  maltophilia NOT DETECTED NOT DETECTED   Candida albicans NOT DETECTED NOT DETECTED   Candida auris NOT DETECTED NOT DETECTED   Candida glabrata NOT DETECTED NOT DETECTED   Candida krusei NOT DETECTED NOT DETECTED   Candida parapsilosis NOT DETECTED NOT DETECTED   Candida tropicalis NOT DETECTED NOT DETECTED   Cryptococcus neoformans/gattii NOT DETECTED NOT DETECTED   Meth resistant mecA/C and MREJ DETECTED (A) NOT DETECTED   Abran Duke, PharmD, BCPS Clinical Pharmacist Phone: [phone]

## 2023-01-01 NOTE — Progress Notes (Addendum)
[facility]   Reason for visit: Follow up on bacteremia  Interval History: repeat blood culture from yesterday again positive for MRSA in 1 bottle.   Day 5 vancomycin  Physical Exam: Constitutional:  Vitals:   01/01/23 1100 01/01/23 1147  BP: 120/77   Pulse: (!) 118   Resp: 19   Temp:  (!) 97.3 F (36.3 C)  SpO2: 100%    patient appears in NAD Respiratory: Normal respiratory effort  Review of Systems: Constitutional: negative for fevers and chills  Lab Results  Component Value Date   WBC 11.3 (H) 01/01/2023   HGB 8.1 (L) 01/01/2023   HCT 26.7 (L) 01/01/2023   MCV 96.7 01/01/2023   PLT 120 (L) 01/01/2023    Lab Results  Component Value Date   CREATININE 1.63 (H) 01/01/2023   BUN 22 01/01/2023   NA 135 01/01/2023   K 4.5 01/01/2023   CL 111 01/01/2023   CO2 16 (L) 01/01/2023    Lab Results  Component Value Date   ALT 20 12/29/2022   AST 28 12/29/2022   ALKPHOS 53 12/29/2022     Microbiology: Recent Results (from the past 240 hour(s))  MRSA Next Gen by PCR, Nasal     Status: Abnormal   Collection Time: 12/27/22  9:38 PM   Specimen: Nasal Mucosa; Nasal Swab  Result Value Ref Range Status   MRSA by PCR Next Gen DETECTED (A) NOT DETECTED Final    Comment: CRITICAL RESULT CALLED TO, READ BACK BY AND VERIFIED WITH: MELISSA COBB RN @ [phone] 12/28/22 [facility] (NOTE) The GeneXpert MRSA Assay (FDA approved for NASAL specimens only), is one component of a comprehensive MRSA colonization surveillance program. It is not intended to diagnose MRSA infection nor to guide or monitor treatment for MRSA infections. Test performance is not FDA approved in patients less than 41 years old. Performed at [facility], [address] [place], [place] [address]   Culture, blood (Routine X 2) w Reflex to ID Panel     Status: Abnormal   Collection Time: 12/28/22 10:47 AM   Specimen: BLOOD RIGHT HAND  Result Value Ref Range Status   Specimen  Description   Final    BLOOD RIGHT HAND Performed at [facility], [address] [place], [place] [address]    Special Requests   Final    BOTTLES DRAWN AEROBIC AND ANAEROBIC Blood Culture adequate volume Performed at [facility], [address] [place], [place] [address]    Culture  Setup Time   Final    GRAM POSITIVE COCCI IN BOTH AEROBIC AND ANAEROBIC BOTTLES CRITICAL RESULT CALLED TO, READ BACK BY AND VERIFIED WITH: TREY GREENWOOD @ 2146 12/28/22 LFD Performed at [facility], [address] [place], [place] [address]    Culture (A)  Final    STAPHYLOCOCCUS AUREUS SUSCEPTIBILITIES PERFORMED ON PREVIOUS CULTURE WITHIN THE LAST 5 DAYS. Performed at [facility], [address], [place], [place] [address]    Report Status 12/31/2022 FINAL  Final  Culture, blood (Routine X 2) w Reflex to ID Panel     Status: Abnormal   Collection Time: 12/28/22 10:48 AM   Specimen: BLOOD LEFT HAND  Result Value Ref Range Status   Specimen Description   Final    BLOOD LEFT HAND Performed at [facility], [address] [place], [place] [address]    Special Requests   Final    BOTTLES DRAWN AEROBIC  AND ANAEROBIC Blood Culture adequate volume Performed at [facility], [address] [place], [place] [address]    Culture  Setup Time   Final    GRAM POSITIVE COCCI IN BOTH AEROBIC AND ANAEROBIC BOTTLES Organism ID to follow CRITICAL RESULT CALLED TO, READ BACK BY AND VERIFIED WITH: TREY GREENWOOD @2146  12/28/22 LFD Performed at [facility], [address] [place], [place] [address]    Culture METHICILLIN RESISTANT STAPHYLOCOCCUS AUREUS (A)  Final   Report Status 12/31/2022 FINAL  Final   Organism ID, Bacteria METHICILLIN RESISTANT STAPHYLOCOCCUS AUREUS  Final      Susceptibility   Methicillin resistant staphylococcus aureus - MIC*    CIPROFLOXACIN >=8 RESISTANT Resistant     ERYTHROMYCIN >=8 RESISTANT Resistant      GENTAMICIN <=0.5 SENSITIVE Sensitive     OXACILLIN >=4 RESISTANT Resistant     TETRACYCLINE <=1 SENSITIVE Sensitive     VANCOMYCIN <=0.5 SENSITIVE Sensitive     TRIMETH/SULFA >=320 RESISTANT Resistant     CLINDAMYCIN <=0.25 SENSITIVE Sensitive     RIFAMPIN <=0.5 SENSITIVE Sensitive     Inducible Clindamycin NEGATIVE Sensitive     LINEZOLID 2 SENSITIVE Sensitive     * METHICILLIN RESISTANT STAPHYLOCOCCUS AUREUS  Blood Culture ID Panel (Reflexed)     Status: Abnormal   Collection Time: 12/28/22 10:48 AM  Result Value Ref Range Status   Enterococcus faecalis NOT DETECTED NOT DETECTED Final   Enterococcus Faecium NOT DETECTED NOT DETECTED Final   Listeria monocytogenes NOT DETECTED NOT DETECTED Final   Staphylococcus species DETECTED (A) NOT DETECTED Final    Comment: CRITICAL RESULT CALLED TO, READ BACK BY AND VERIFIED WITH: TREY GREENWOOD @ 2146 12/28/22 LFD    Staphylococcus aureus (BCID) DETECTED (A) NOT DETECTED Final    Comment: Methicillin (oxacillin)-resistant Staphylococcus aureus (MRSA). MRSA is predictably resistant to beta-lactam antibiotics (except ceftaroline). Preferred therapy is vancomycin unless clinically contraindicated. Patient requires contact precautions if  hospitalized. CRITICAL RESULT CALLED TO, READ BACK BY AND VERIFIED WITH: TREY GREENWOOD @ 2146 12/28/22 LFD    Staphylococcus epidermidis NOT DETECTED NOT DETECTED Final   Staphylococcus lugdunensis NOT DETECTED NOT DETECTED Final   Streptococcus species NOT DETECTED NOT DETECTED Final   Streptococcus agalactiae NOT DETECTED NOT DETECTED Final   Streptococcus pneumoniae NOT DETECTED NOT DETECTED Final   Streptococcus pyogenes NOT DETECTED NOT DETECTED Final   A.calcoaceticus-baumannii NOT DETECTED NOT DETECTED Final   Bacteroides fragilis NOT DETECTED NOT DETECTED Final   Enterobacterales NOT DETECTED NOT DETECTED Final   Enterobacter cloacae complex NOT DETECTED NOT DETECTED Final   Escherichia coli NOT  DETECTED NOT DETECTED Final   Klebsiella aerogenes NOT DETECTED NOT DETECTED Final   Klebsiella oxytoca NOT DETECTED NOT DETECTED Final   Klebsiella pneumoniae NOT DETECTED NOT DETECTED Final   Proteus species NOT DETECTED NOT DETECTED Final   Salmonella species NOT DETECTED NOT DETECTED Final   Serratia marcescens NOT DETECTED NOT DETECTED Final   Haemophilus influenzae NOT DETECTED NOT DETECTED Final   Neisseria meningitidis NOT DETECTED NOT DETECTED Final   Pseudomonas aeruginosa NOT DETECTED NOT DETECTED Final   Stenotrophomonas maltophilia NOT DETECTED NOT DETECTED Final   Candida albicans NOT DETECTED NOT DETECTED Final   Candida auris NOT DETECTED NOT DETECTED Final   Candida glabrata NOT DETECTED NOT DETECTED Final   Candida krusei NOT DETECTED NOT DETECTED Final   Candida parapsilosis NOT DETECTED NOT DETECTED Final   Candida tropicalis NOT DETECTED NOT DETECTED Final   Cryptococcus neoformans/gattii  NOT DETECTED NOT DETECTED Final   Meth resistant mecA/C and MREJ DETECTED (A) NOT DETECTED Final    Comment: CRITICAL RESULT CALLED TO, READ BACK BY AND VERIFIED WITH: TREY GREENWOOD @ 2146 12/28/22 LFD Performed at [facility], [address] [place], [place] [address]   Respiratory (~20 pathogens) panel by PCR     Status: None   Collection Time: 12/28/22 11:55 AM   Specimen: Nasopharyngeal Swab; Respiratory  Result Value Ref Range Status   Adenovirus NOT DETECTED NOT DETECTED Final   Coronavirus 229E NOT DETECTED NOT DETECTED Final    Comment: (NOTE) The Coronavirus on the Respiratory Panel, DOES NOT test for the novel  Coronavirus (2019 nCoV)    Coronavirus HKU1 NOT DETECTED NOT DETECTED Final   Coronavirus NL63 NOT DETECTED NOT DETECTED Final   Coronavirus OC43 NOT DETECTED NOT DETECTED Final   Metapneumovirus NOT DETECTED NOT DETECTED Final   Rhinovirus / Enterovirus NOT DETECTED NOT DETECTED Final   Influenza A NOT DETECTED NOT DETECTED Final   Influenza  B NOT DETECTED NOT DETECTED Final   Parainfluenza Virus 1 NOT DETECTED NOT DETECTED Final   Parainfluenza Virus 2 NOT DETECTED NOT DETECTED Final   Parainfluenza Virus 3 NOT DETECTED NOT DETECTED Final   Parainfluenza Virus 4 NOT DETECTED NOT DETECTED Final   Respiratory Syncytial Virus NOT DETECTED NOT DETECTED Final   Bordetella pertussis NOT DETECTED NOT DETECTED Final   Bordetella Parapertussis NOT DETECTED NOT DETECTED Final   Chlamydophila pneumoniae NOT DETECTED NOT DETECTED Final   Mycoplasma pneumoniae NOT DETECTED NOT DETECTED Final    Comment: Performed at [facility], [address], [place], [place] [address]  SARS Coronavirus 2 by RT PCR (hospital order, performed in [facility] hospital lab) *cepheid single result test* Anterior Nasal Swab     Status: None   Collection Time: 12/29/22  1:06 PM   Specimen: Anterior Nasal Swab  Result Value Ref Range Status   SARS Coronavirus 2 by RT PCR NEGATIVE NEGATIVE Final    Comment: Performed at [facility], [address] [place], [place] [address]  MRSA Next Gen by PCR, Nasal     Status: Abnormal   Collection Time: 12/29/22  6:50 PM   Specimen: Nasal Mucosa; Nasal Swab  Result Value Ref Range Status   MRSA by PCR Next Gen DETECTED (A) NOT DETECTED Final    Comment: RESULT CALLED TO, READ BACK BY AND VERIFIED WITH: S. SAVAGE  RN 12/29/22 @ 2126 BY AB (NOTE) The GeneXpert MRSA Assay (FDA approved for NASAL specimens only), is one component of a comprehensive MRSA colonization surveillance program. It is not intended to diagnose MRSA infection nor to guide or monitor treatment for MRSA infections. Test performance is not FDA approved in patients less than 45 years old. Performed at [facility], [address] [place], [place] [address]   Culture, blood (Routine X 2) w Reflex to ID Panel     Status: None (Preliminary result)   Collection Time: 12/31/22  7:37 AM   Specimen: BLOOD RIGHT HAND  Result Value Ref  Range Status   Specimen Description BLOOD RIGHT HAND  Final   Special Requests   Final    BOTTLES DRAWN AEROBIC AND ANAEROBIC Blood Culture adequate volume   Culture  Setup Time   Final    GRAM POSITIVE COCCI IN CLUSTERS ANAEROBIC BOTTLE ONLY CRITICAL RESULT CALLED TO, READ BACK BY AND VERIFIED WITH: PHARMD J. LEDFORD 01/01/23 @  0330 BY AB Performed at [facility], [address] [place], [place] [address]    Culture GRAM POSITIVE COCCI  Final   Report Status PENDING  Incomplete  Blood Culture ID Panel (Reflexed)     Status: Abnormal   Collection Time: 12/31/22  7:37 AM  Result Value Ref Range Status   Enterococcus faecalis NOT DETECTED NOT DETECTED Final   Enterococcus Faecium NOT DETECTED NOT DETECTED Final   Listeria monocytogenes NOT DETECTED NOT DETECTED Final   Staphylococcus species DETECTED (A) NOT DETECTED Final    Comment: CRITICAL RESULT CALLED TO, READ BACK BY AND VERIFIED WITH: PHARMD J. LEDFORD 01/01/23 @ 0330 BY AB    Staphylococcus aureus (BCID) DETECTED (A) NOT DETECTED Final    Comment: Methicillin (oxacillin)-resistant Staphylococcus aureus (MRSA). MRSA is predictably resistant to beta-lactam antibiotics (except ceftaroline). Preferred therapy is vancomycin unless clinically contraindicated. Patient requires contact precautions if  hospitalized. CRITICAL RESULT CALLED TO, READ BACK BY AND VERIFIED WITH: PHARMD J. LEDFORD 01/01/23 @ 0330 BY AB    Staphylococcus epidermidis NOT DETECTED NOT DETECTED Final   Staphylococcus lugdunensis NOT DETECTED NOT DETECTED Final   Streptococcus species NOT DETECTED NOT DETECTED Final   Streptococcus agalactiae NOT DETECTED NOT DETECTED Final   Streptococcus pneumoniae NOT DETECTED NOT DETECTED Final   Streptococcus pyogenes NOT DETECTED NOT DETECTED Final   A.calcoaceticus-baumannii NOT DETECTED NOT DETECTED Final   Bacteroides fragilis NOT DETECTED NOT DETECTED Final   Enterobacterales NOT DETECTED NOT DETECTED Final    Enterobacter cloacae complex NOT DETECTED NOT DETECTED Final   Escherichia coli NOT DETECTED NOT DETECTED Final   Klebsiella aerogenes NOT DETECTED NOT DETECTED Final   Klebsiella oxytoca NOT DETECTED NOT DETECTED Final   Klebsiella pneumoniae NOT DETECTED NOT DETECTED Final   Proteus species NOT DETECTED NOT DETECTED Final   Salmonella species NOT DETECTED NOT DETECTED Final   Serratia marcescens NOT DETECTED NOT DETECTED Final   Haemophilus influenzae NOT DETECTED NOT DETECTED Final   Neisseria meningitidis NOT DETECTED NOT DETECTED Final   Pseudomonas aeruginosa NOT DETECTED NOT DETECTED Final   Stenotrophomonas maltophilia NOT DETECTED NOT DETECTED Final   Candida albicans NOT DETECTED NOT DETECTED Final   Candida auris NOT DETECTED NOT DETECTED Final   Candida glabrata NOT DETECTED NOT DETECTED Final   Candida krusei NOT DETECTED NOT DETECTED Final   Candida parapsilosis NOT DETECTED NOT DETECTED Final   Candida tropicalis NOT DETECTED NOT DETECTED Final   Cryptococcus neoformans/gattii NOT DETECTED NOT DETECTED Final   Meth resistant mecA/C and MREJ DETECTED (A) NOT DETECTED Final    Comment: CRITICAL RESULT CALLED TO, READ BACK BY AND VERIFIED WITH: PHARMD J. LEDFORD 01/01/23 @ 0330 BY AB Performed at [facility], [address] [place], [place] [address]   Culture, blood (Routine X 2) w Reflex to ID Panel     Status: None (Preliminary result)   Collection Time: 12/31/22  7:43 AM   Specimen: BLOOD RIGHT HAND  Result Value Ref Range Status   Specimen Description BLOOD RIGHT HAND  Final   Special Requests   Final    BOTTLES DRAWN AEROBIC ONLY Blood Culture adequate volume   Culture   Final    NO GROWTH 1 DAY Performed at [facility], [address], [place], [place] [address]    Report Status PENDING  Incomplete    Impression/Plan:  1. 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  Fever curve stable and  WBC minimally up to 11.3.   TEE  requested with the associated ICD and now persistently positive blood cultures making a vegetation more likely  Continue with vancomycin  2.  Acute on chronic renal disease - stable creat, down to 1.63.   Will continue to monitor  3.  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  TEE planned unless he transitions to hospice.    4.  Neck pain - no signs of infection on CT.    Will continue to monitor.

## 2023-01-01 NOTE — Progress Notes (Signed)
eLink Physician-Brief Progress Note Patient Name: OCTAVIUS SHIN DOB: 04/29/1941 MRN: [id number]   Date of Service  01/01/2023  HPI/Events of Note  Notified of positive blood culture with 1/4 with MRSA.   eICU Interventions  Continue Vancomycin.     Intervention Category Intermediate Interventions: Other:  Larinda Buttery 01/01/2023, 3:54 AM

## 2023-01-02 ENCOUNTER — Other Ambulatory Visit: Payer: Self-pay

## 2023-01-02 DIAGNOSIS — B9562 Methicillin resistant Staphylococcus aureus infection as the cause of diseases classified elsewhere: Secondary | ICD-10-CM | POA: Diagnosis not present

## 2023-01-02 DIAGNOSIS — R7881 Bacteremia: Secondary | ICD-10-CM | POA: Diagnosis not present

## 2023-01-02 LAB — CBC
HCT: 27.1 % — ABNORMAL LOW (ref 39.0–52.0)
Hemoglobin: 8.3 g/dL — ABNORMAL LOW (ref 13.0–17.0)
MCH: 28.5 pg (ref 26.0–34.0)
MCHC: 30.6 g/dL (ref 30.0–36.0)
MCV: 93.1 fL (ref 80.0–100.0)
Platelets: 167 10*3/uL (ref 150–400)
RBC: 2.91 MIL/uL — ABNORMAL LOW (ref 4.22–5.81)
RDW: 16.1 % — ABNORMAL HIGH (ref 11.5–15.5)
WBC: 11.8 10*3/uL — ABNORMAL HIGH (ref 4.0–10.5)
nRBC: 0 % (ref 0.0–0.2)

## 2023-01-02 LAB — GLUCOSE, CAPILLARY
Glucose-Capillary: 111 mg/dL — ABNORMAL HIGH (ref 70–99)
Glucose-Capillary: 113 mg/dL — ABNORMAL HIGH (ref 70–99)
Glucose-Capillary: 122 mg/dL — ABNORMAL HIGH (ref 70–99)
Glucose-Capillary: 125 mg/dL — ABNORMAL HIGH (ref 70–99)
Glucose-Capillary: 144 mg/dL — ABNORMAL HIGH (ref 70–99)
Glucose-Capillary: 91 mg/dL (ref 70–99)

## 2023-01-02 LAB — PHOSPHORUS
Phosphorus: 2.3 mg/dL — ABNORMAL LOW (ref 2.5–4.6)
Phosphorus: 2.1 mg/dL — ABNORMAL LOW (ref 2.5–4.6)

## 2023-01-02 LAB — BASIC METABOLIC PANEL
Anion gap: 6 (ref 5–15)
BUN: 23 mg/dL (ref 8–23)
CO2: 19 mmol/L — ABNORMAL LOW (ref 22–32)
Calcium: 8.6 mg/dL — ABNORMAL LOW (ref 8.9–10.3)
Chloride: 110 mmol/L (ref 98–111)
Creatinine, Ser: 1.64 mg/dL — ABNORMAL HIGH (ref 0.61–1.24)
GFR, Estimated: 42 mL/min — ABNORMAL LOW (ref >60)
Glucose, Bld: 117 mg/dL — ABNORMAL HIGH (ref 70–99)
Potassium: 4.1 mmol/L (ref 3.5–5.1)
Sodium: 135 mmol/L (ref 135–145)

## 2023-01-02 LAB — C-REACTIVE PROTEIN: CRP: 17.4 mg/dL — ABNORMAL HIGH (ref <1.0)

## 2023-01-02 LAB — MAGNESIUM
Magnesium: 2 mg/dL (ref 1.7–2.4)
Magnesium: 1.8 mg/dL (ref 1.7–2.4)

## 2023-01-02 LAB — VANCOMYCIN, TROUGH: Vancomycin Tr: 18 ug/mL (ref 15–20)

## 2023-01-02 MED ORDER — THIAMINE MONONITRATE 100 MG PO TABS
100.0000 mg | ORAL_TABLET | Freq: Every day | ORAL | Status: DC
  Administered 2023-01-02 – 2023-01-04 (×3): 100 mg
  Filled 2023-01-02 (×3): qty 1

## 2023-01-02 MED ORDER — LORATADINE 10 MG PO TABS: 10.0000 mg | ORAL_TABLET | Freq: Every day | ORAL | Status: DC | PRN

## 2023-01-02 MED ORDER — POLYVINYL ALCOHOL 1.4 % OP SOLN
1.0000 [drp] | OPHTHALMIC | Status: DC | PRN
  Administered 2023-01-06 – 2023-01-14 (×2): 1 [drp] via OPHTHALMIC
  Filled 2023-01-02 (×2): qty 15

## 2023-01-02 MED ORDER — ONDANSETRON HCL 4 MG/2ML IJ SOLN
4.0000 mg | Freq: Four times a day (QID) | INTRAMUSCULAR | Status: DC | PRN
  Administered 2023-01-09 – 2023-01-11 (×2): 4 mg via INTRAVENOUS
  Filled 2023-01-02 (×4): qty 2

## 2023-01-02 MED ORDER — METOPROLOL TARTRATE 5 MG/5ML IV SOLN: 5.0000 mg | INTRAVENOUS | Status: DC | PRN

## 2023-01-02 MED ORDER — MUSCLE RUB 10-15 % EX CREA
1.0000 | TOPICAL_CREAM | CUTANEOUS | Status: DC | PRN
  Filled 2023-01-02: qty 85

## 2023-01-02 MED ORDER — HYDROCORTISONE (PERIANAL) 2.5 % EX CREA
1.0000 | TOPICAL_CREAM | Freq: Four times a day (QID) | CUTANEOUS | Status: DC | PRN
  Filled 2023-01-02: qty 28.35

## 2023-01-02 MED ORDER — SENNOSIDES-DOCUSATE SODIUM 8.6-50 MG PO TABS: 1.0000 | ORAL_TABLET | Freq: Every evening | ORAL | Status: DC | PRN

## 2023-01-02 MED ORDER — LEVALBUTEROL HCL 1.25 MG/0.5ML IN NEBU
1.2500 mg | INHALATION_SOLUTION | Freq: Four times a day (QID) | RESPIRATORY_TRACT | Status: DC | PRN
  Administered 2023-01-11 – 2023-01-12 (×2): 1.25 mg via RESPIRATORY_TRACT
  Filled 2023-01-02 (×4): qty 0.5

## 2023-01-02 MED ORDER — ALUM & MAG HYDROXIDE-SIMETH 200-200-20 MG/5ML PO SUSP: 30.0000 mL | ORAL | Status: DC | PRN

## 2023-01-02 MED ORDER — METOPROLOL TARTRATE 5 MG/5ML IV SOLN
2.5000 mg | INTRAVENOUS | Status: DC | PRN
  Administered 2023-01-02: 5 mg via INTRAVENOUS
  Filled 2023-01-02: qty 5

## 2023-01-02 MED ORDER — SALINE SPRAY 0.65 % NA SOLN: 1.0000 | NASAL | Status: DC | PRN

## 2023-01-02 MED ORDER — OSMOLITE 1.5 CAL PO LIQD
1000.0000 mL | ORAL | Status: DC
  Administered 2023-01-02 – 2023-01-04 (×3): 1000 mL

## 2023-01-02 MED ORDER — RENA-VITE PO TABS
1.0000 | ORAL_TABLET | Freq: Every day | ORAL | Status: DC
  Administered 2023-01-03 – 2023-01-15 (×12): 1 via ORAL
  Filled 2023-01-02 (×12): qty 1

## 2023-01-02 MED ORDER — METOPROLOL TARTRATE 50 MG PO TABS
75.0000 mg | ORAL_TABLET | Freq: Two times a day (BID) | ORAL | Status: DC
  Administered 2023-01-02 – 2023-01-12 (×21): 75 mg via ORAL
  Filled 2023-01-02 (×21): qty 1

## 2023-01-02 MED ORDER — HYDRALAZINE HCL 20 MG/ML IJ SOLN: 10.0000 mg | INTRAMUSCULAR | Status: DC | PRN

## 2023-01-02 MED ORDER — ENSURE ENLIVE PO LIQD
237.0000 mL | Freq: Two times a day (BID) | ORAL | Status: DC
  Administered 2023-01-04 – 2023-01-10 (×9): 237 mL via ORAL

## 2023-01-02 MED ORDER — HYDROCORTISONE 1 % EX CREA
1.0000 | TOPICAL_CREAM | Freq: Three times a day (TID) | CUTANEOUS | Status: DC | PRN
  Filled 2023-01-02: qty 28

## 2023-01-02 NOTE — Progress Notes (Signed)
PROGRESS NOTE    Matthew Zamora  ZOX:[id number] DOB: [birth year] DOA: 12/29/2022 PCP: Jerl Mina, MD   Brief Narrative:  82 year old with history of GERD, refractory orthostatic hypotension, CHF with reduced EF, chronic nausea and vomiting initially presented to [facility] ED with acute shortness of breath chest pain.  Initial evaluation showed acute CHF complicated by atrial fibrillation with RVR.  Initially hypotensive resuscitated with fluid and started on amiodarone drip.  Patient spiked a fever therefore started on broad-spectrum antibiotics eventually blood cultures grew MRSA and respiratory viral panel was positive for rhinovirus.  On 7/14 patient also suffered from acute right-sided weakness with aphasia.  CT angio showed acute left occlusion ICA.  Neurology and vascular were also consulted and patient was transferred to [facility] for neuroIR eval.  Patient underwent left carotid stent placement and post procedure transferred to the ICU.   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  Neurology and IR planning to continue Eliquis, Brilinta.  Continue statin.  PT/OT-SNF   MRSA bacteremia, source from cardiac device Septic shock, resolved Repeat blood cultures still remain positive for MRSA bacteremia.  ID is following, patient remains on IV vancomycin.  CT scan does not show any evidence of cervical/thoracic discitis.  He does have an ICD in place with concerns of vegetation.  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  Due to soft blood pressure, holding off on introducing any aggressive diuretics at this time.  Continue Lopressor 50 mg twice daily.  Also on midodrine 30 mg every 8 hours   Atrial fibrillation with RVR Rate is still elevated, on metoprolol 50 mg twice daily.  IV as needed. Eliquis BID Consult cardiology We may need to restart amiodarone drip  Dysphagia with severe nutritional deficiency - Cortrak in place, getting tube feeds.  Monitor for refeeding syndrome.  As we approach goal rate and if patient would like p.o. intake, we can slowly transition to nocturnal tube feeds   Left shoulder pain Severe C6-C7 neuroforaminal stenosis Severe C6-C7 foraminal stenosis on the left cervical spine.  No evidence of discitis seen on CT.  CK normal.  Pain control, physical therapy.  Voltaren gel   Acute on chronic CKD stage IIIb NAGMA Hyponatremia, improving Baseline creatinine around 1.5, admission creatinine 2.12.  Slowly improving at this time.  Monitor urine output.   Normocytic anemia, likely anemia of chronic disease Hemoglobin stable at 8.3   Thrombocytopenia Platelets had drifted down to 92 now slowly recovering    GERD -PPI   Chronic hypotension On midodrine 30 mg 3 times daily, Mestinon   Depression/anxiety -Continue home sertraline 25 mg daily    Acute metabolic encephalopathy secondary to sepsis, improved Improved      DVT prophylaxis: Eliquis Code Status: Full code Family Communication:   Chronically ill patient with MRSA bacteremia and multiple ongoing issues.  Will be in the hospital for least next 3-5 days       Diet Orders (From admission, onward)     Start     Ordered  12/30/22 1025  Diet regular Room service appropriate? Yes with Assist; Fluid consistency: Thin  Diet effective now       Comments: Meds whole with pudding  Question Answer Comment  Room service appropriate? Yes with Assist   Fluid consistency: Thin      12/30/22 1024             Subjective: Seen at bedside, very poor oral intake.  Denies any complaints.   Examination:  Constitutional: Not in acute distress.  Appears chronically ill.  Cortrak in place Respiratory: Some by basilar rhonchi Cardiovascular: Normal sinus rhythm, no rubs Abdomen: Nontender nondistended good bowel sounds Musculoskeletal: No edema noted Skin: Bilateral upper extremity ecchymosis Neurologic: CN 2-12 grossly intact.  And nonfocal Psychiatric: Poor judgment and insight.  Alert to name and place  Objective: Vitals:   01/02/23 0346 01/02/23 0453 01/02/23 0500 01/02/23 0729  BP:    129/81  Pulse: (!) 124 (!) 124  (!) 126  Resp: 20 20  (!) 24  Temp:  98.4 F (36.9 C) 98.9 F (37.2 C) (!) 97.4 F (36.3 C)  TempSrc:  Axillary Axillary Oral  SpO2:    100%  Weight:  73.5 kg      Intake/Output Summary (Last 24 hours) at 01/02/2023 0747 Last data filed at 01/02/2023 0535 Gross per 24 hour  Intake 635.27 ml  Output 1150 ml  Net -514.73 ml   Filed Weights   12/30/22 0500 01/01/23 0500 01/02/23 0453  Weight: 70.5 kg 73 kg 73.5 kg    Scheduled Meds:  apixaban  5 mg Oral BID   atorvastatin  80 mg Oral QHS   Chlorhexidine Gluconate Cloth  6 each Topical Q0600   diclofenac Sodium  4 g Topical QID   feeding supplement (PROSource TF20)  60 mL Per Tube Daily   gabapentin  100 mg Oral QHS   metoprolol tartrate  50 mg Oral BID   midodrine  30 mg Oral Q8H   multivitamin with minerals  1 tablet Oral Daily   mupirocin ointment  1 Application Nasal BID   pantoprazole  40 mg Oral BID   pyridostigmine  60 mg Oral Q8H   sertraline  25 mg Oral Daily   sucralfate  1 g Oral TID WC & HS   ticagrelor  90 mg Oral BID   Or   ticagrelor  90 mg Per Tube BID   Continuous Infusions:  feeding supplement (VITAL AF 1.2 CAL) 1,000 mL (01/01/23 2335)   vancomycin Stopped (01/01/23 2129)    Nutritional status     Body mass index is 24.64 kg/m.  Data Reviewed:   CBC: Recent Labs   Lab 12/29/22 0836 12/30/22 0015 12/30/22 0322 12/31/22 0519 01/01/23 0113 01/02/23 0643  WBC 12.9*  --  9.0 9.4 11.3* 11.8*  HGB 8.4* 12.9* 7.4* 7.3* 8.1* 8.3*  HCT 26.2* 38.0* 22.7* 23.2* 26.7* 27.1*  MCV 92.6  --  92.3 91.7 96.7 93.1  PLT 109*  --  92*  89* 101* 120* 167   Basic Metabolic Panel: Recent Labs  Lab 12/29/22 2233 12/30/22 0015 12/30/22 0322 12/31/22 0519 01/01/23 0113 01/01/23 1734 01/02/23 0643  NA 137 136 132* 134* 135  --  135  K 3.7 3.5 3.8 3.7 4.5  --  4.1  CL 105  --  106 109 111  --  110  CO2 20*  --  18* 18* 16*  --  19*  GLUCOSE 91  --  121* 81 79  --  117*  BUN 33*  --  31* 24* 22  --  23  CREATININE 1.86*  --  1.73* 1.75* 1.63*  --  1.64*  CALCIUM 8.1*  --  7.9* 8.4* 8.6*  --  8.6*  MG 1.6*  --  2.1 2.1 2.1 2.0 2.0  PHOS 3.0  --  2.9  --   --  2.6 2.3*   GFR: Estimated Creatinine Clearance: 34.8 mL/min (A) (by C-G formula based on SCr of 1.64 mg/dL (H)). Liver Function Tests: Recent Labs  Lab 12/27/22 1216 12/28/22 0409 12/29/22 0836  AST 25 22 28   ALT 25 21 20   ALKPHOS 81 66 53  BILITOT 0.6 0.9 0.9  PROT 6.2* 5.7* 5.1*  ALBUMIN 3.5 3.3* 2.8*   No results for input(s): "LIPASE", "AMYLASE" in the last 168 hours. No results for input(s): "AMMONIA" in the last 168 hours. Coagulation Profile: Recent Labs  Lab 12/30/22 0322  INR 1.4*   Cardiac Enzymes: Recent Labs  Lab 12/31/22 0924  CKTOTAL 92   BNP (last 3 results) No results for input(s): "PROBNP" in the last 8760 hours. HbA1C: No results for input(s): "HGBA1C" in the last 72 hours. CBG: Recent Labs  Lab 01/01/23 1609 01/01/23 1917 01/01/23 2239 01/02/23 0028 01/02/23 0441  GLUCAP 175* 96 101* 91 125*   Lipid Profile: No results for input(s): "CHOL", "HDL", "LDLCALC", "TRIG", "CHOLHDL", "LDLDIRECT" in the last 72 hours. Thyroid Function Tests: No results for input(s): "TSH", "T4TOTAL", "FREET4", "T3FREE", "THYROIDAB" in the last 72 hours. Anemia Panel: Recent  Labs    12/31/22 0743  FERRITIN 1,030*  TIBC 211*  IRON 13*   Sepsis Labs: Recent Labs  Lab 12/28/22 1340 12/29/22 0836  PROCALCITON  --  5.58  LATICACIDVEN 1.6  --     Recent Results (from the past 240 hour(s))  MRSA Next Gen by PCR, Nasal     Status: Abnormal   Collection Time: 12/27/22  9:38 PM   Specimen: Nasal Mucosa; Nasal Swab  Result Value Ref Range Status   MRSA by PCR Next Gen DETECTED (A) NOT DETECTED Final    Comment: CRITICAL RESULT CALLED TO, READ BACK BY AND VERIFIED WITH: MELISSA COBB RN @ [phone] 12/28/22 [facility] (NOTE) The GeneXpert MRSA Assay (FDA approved for NASAL specimens only), is one component of a comprehensive MRSA colonization surveillance program. It is not intended to diagnose MRSA infection nor to guide or monitor treatment for MRSA infections. Test performance is not FDA approved in patients less than 28 years old. Performed at [facility], [address] [place], [place] [address]   Culture, blood (Routine X 2) w Reflex to ID Panel     Status: Abnormal   Collection Time: 12/28/22 10:47 AM   Specimen: BLOOD RIGHT HAND  Result Value Ref Range Status   Specimen Description   Final    BLOOD RIGHT HAND Performed at [facility], [address], [place], [place] [address]    Special Requests   Final    BOTTLES DRAWN AEROBIC AND ANAEROBIC Blood Culture adequate volume Performed at [facility], [address] [place], [place] [address]    Culture  Setup Time   Final    GRAM POSITIVE COCCI IN BOTH AEROBIC AND ANAEROBIC BOTTLES CRITICAL RESULT CALLED TO, READ BACK BY AND VERIFIED WITH: TREY GREENWOOD @ 2146 12/28/22 LFD Performed at [facility], [address] [place], [place] [address]    Culture (A)  Final    STAPHYLOCOCCUS AUREUS SUSCEPTIBILITIES  PERFORMED ON PREVIOUS CULTURE WITHIN THE LAST 5 DAYS. Performed at [facility], [address] [place], [place] [address]    Report Status  12/31/2022 FINAL  Final  Culture, blood (Routine X 2) w Reflex to ID Panel     Status: Abnormal   Collection Time: 12/28/22 10:48 AM   Specimen: BLOOD LEFT HAND  Result Value Ref Range Status   Specimen Description   Final    BLOOD LEFT HAND Performed at [facility], [address] [place], [place] [address]    Special Requests   Final    BOTTLES DRAWN AEROBIC AND ANAEROBIC Blood Culture adequate volume Performed at [facility], [address] [place], [place] [address]    Culture  Setup Time   Final    GRAM POSITIVE COCCI IN BOTH AEROBIC AND ANAEROBIC BOTTLES Organism ID to follow CRITICAL RESULT CALLED TO, READ BACK BY AND VERIFIED WITH: TREY GREENWOOD @2146  12/28/22 LFD Performed at [facility], [address] [place], [place] [address]    Culture METHICILLIN RESISTANT STAPHYLOCOCCUS AUREUS (A)  Final   Report Status 12/31/2022 FINAL  Final   Organism ID, Bacteria METHICILLIN RESISTANT STAPHYLOCOCCUS AUREUS  Final      Susceptibility   Methicillin resistant staphylococcus aureus - MIC*    CIPROFLOXACIN >=8 RESISTANT Resistant     ERYTHROMYCIN >=8 RESISTANT Resistant     GENTAMICIN <=0.5 SENSITIVE Sensitive     OXACILLIN >=4 RESISTANT Resistant     TETRACYCLINE <=1 SENSITIVE Sensitive     VANCOMYCIN <=0.5 SENSITIVE Sensitive     TRIMETH/SULFA >=320 RESISTANT Resistant     CLINDAMYCIN <=0.25 SENSITIVE Sensitive     RIFAMPIN <=0.5 SENSITIVE Sensitive     Inducible Clindamycin NEGATIVE Sensitive     LINEZOLID 2 SENSITIVE Sensitive     * METHICILLIN RESISTANT STAPHYLOCOCCUS AUREUS  Blood Culture ID Panel (Reflexed)     Status: Abnormal   Collection Time: 12/28/22 10:48 AM  Result Value Ref Range Status   Enterococcus faecalis NOT DETECTED NOT DETECTED Final   Enterococcus Faecium NOT DETECTED NOT DETECTED Final   Listeria monocytogenes NOT DETECTED NOT DETECTED Final   Staphylococcus species DETECTED (A) NOT DETECTED Final    Comment:  CRITICAL RESULT CALLED TO, READ BACK BY AND VERIFIED WITH: TREY GREENWOOD @ 2146 12/28/22 LFD    Staphylococcus aureus (BCID) DETECTED (A) NOT DETECTED Final    Comment: Methicillin (oxacillin)-resistant Staphylococcus aureus (MRSA). MRSA is predictably resistant to beta-lactam antibiotics (except ceftaroline). Preferred therapy is vancomycin unless clinically contraindicated. Patient requires contact precautions if  hospitalized. CRITICAL RESULT CALLED TO, READ BACK BY AND VERIFIED WITH: TREY GREENWOOD @ 2146 12/28/22 LFD    Staphylococcus epidermidis NOT DETECTED NOT DETECTED Final   Staphylococcus lugdunensis NOT DETECTED NOT DETECTED Final   Streptococcus species NOT DETECTED NOT DETECTED Final   Streptococcus agalactiae NOT DETECTED NOT DETECTED Final   Streptococcus pneumoniae NOT DETECTED NOT DETECTED Final   Streptococcus pyogenes NOT DETECTED NOT DETECTED Final   A.calcoaceticus-baumannii NOT DETECTED NOT DETECTED Final   Bacteroides fragilis NOT DETECTED NOT DETECTED Final   Enterobacterales NOT DETECTED NOT DETECTED Final   Enterobacter cloacae complex NOT DETECTED NOT DETECTED Final   Escherichia coli NOT DETECTED NOT DETECTED Final   Klebsiella aerogenes NOT DETECTED NOT DETECTED Final   Klebsiella oxytoca NOT DETECTED NOT DETECTED Final   Klebsiella pneumoniae NOT DETECTED NOT DETECTED Final   Proteus species NOT DETECTED NOT DETECTED Final   Salmonella species NOT DETECTED  NOT DETECTED Final   Serratia marcescens NOT DETECTED NOT DETECTED Final   Haemophilus influenzae NOT DETECTED NOT DETECTED Final   Neisseria meningitidis NOT DETECTED NOT DETECTED Final   Pseudomonas aeruginosa NOT DETECTED NOT DETECTED Final   Stenotrophomonas maltophilia NOT DETECTED NOT DETECTED Final   Candida albicans NOT DETECTED NOT DETECTED Final   Candida auris NOT DETECTED NOT DETECTED Final   Candida glabrata NOT DETECTED NOT DETECTED Final   Candida krusei NOT DETECTED NOT DETECTED Final    Candida parapsilosis NOT DETECTED NOT DETECTED Final   Candida tropicalis NOT DETECTED NOT DETECTED Final   Cryptococcus neoformans/gattii NOT DETECTED NOT DETECTED Final   Meth resistant mecA/C and MREJ DETECTED (A) NOT DETECTED Final    Comment: CRITICAL RESULT CALLED TO, READ BACK BY AND VERIFIED WITH: TREY GREENWOOD @ 2146 12/28/22 LFD Performed at [facility], [address] [place], [place] [address]   Respiratory (~20 pathogens) panel by PCR     Status: None   Collection Time: 12/28/22 11:55 AM   Specimen: Nasopharyngeal Swab; Respiratory  Result Value Ref Range Status   Adenovirus NOT DETECTED NOT DETECTED Final   Coronavirus 229E NOT DETECTED NOT DETECTED Final    Comment: (NOTE) The Coronavirus on the Respiratory Panel, DOES NOT test for the novel  Coronavirus (2019 nCoV)    Coronavirus HKU1 NOT DETECTED NOT DETECTED Final   Coronavirus NL63 NOT DETECTED NOT DETECTED Final   Coronavirus OC43 NOT DETECTED NOT DETECTED Final   Metapneumovirus NOT DETECTED NOT DETECTED Final   Rhinovirus / Enterovirus NOT DETECTED NOT DETECTED Final   Influenza A NOT DETECTED NOT DETECTED Final   Influenza B NOT DETECTED NOT DETECTED Final   Parainfluenza Virus 1 NOT DETECTED NOT DETECTED Final   Parainfluenza Virus 2 NOT DETECTED NOT DETECTED Final   Parainfluenza Virus 3 NOT DETECTED NOT DETECTED Final   Parainfluenza Virus 4 NOT DETECTED NOT DETECTED Final   Respiratory Syncytial Virus NOT DETECTED NOT DETECTED Final   Bordetella pertussis NOT DETECTED NOT DETECTED Final   Bordetella Parapertussis NOT DETECTED NOT DETECTED Final   Chlamydophila pneumoniae NOT DETECTED NOT DETECTED Final   Mycoplasma pneumoniae NOT DETECTED NOT DETECTED Final    Comment: Performed at [facility], [address] [place], [place] [address]  SARS Coronavirus 2 by RT PCR (hospital order, performed in [facility] hospital lab) *cepheid single result test* Anterior Nasal Swab     Status:  None   Collection Time: 12/29/22  1:06 PM   Specimen: Anterior Nasal Swab  Result Value Ref Range Status   SARS Coronavirus 2 by RT PCR NEGATIVE NEGATIVE Final    Comment: Performed at [facility], [address] [place], [place] [address]  MRSA Next Gen by PCR, Nasal     Status: Abnormal   Collection Time: 12/29/22  6:50 PM   Specimen: Nasal Mucosa; Nasal Swab  Result Value Ref Range Status   MRSA by PCR Next Gen DETECTED (A) NOT DETECTED Final    Comment: RESULT CALLED TO, READ BACK BY AND VERIFIED WITH: S. SAVAGE  RN 12/29/22 @ 2126 BY AB (NOTE) The GeneXpert MRSA Assay (FDA approved for NASAL specimens only), is one component of a comprehensive MRSA colonization surveillance program. It is not intended to diagnose MRSA infection nor to guide or monitor treatment for MRSA infections. Test performance is not FDA approved in patients less than 18 years old. Performed at [facility], [address]  [place], [place] [address]   Culture, blood (Routine X 2) w Reflex to ID Panel     Status: None (Preliminary result)   Collection Time: 12/31/22  7:37 AM   Specimen: BLOOD RIGHT HAND  Result Value Ref Range Status   Specimen Description BLOOD RIGHT HAND  Final   Special Requests   Final    BOTTLES DRAWN AEROBIC AND ANAEROBIC Blood Culture adequate volume   Culture  Setup Time   Final    GRAM POSITIVE COCCI IN CLUSTERS ANAEROBIC BOTTLE ONLY CRITICAL RESULT CALLED TO, READ BACK BY AND VERIFIED WITH: PHARMD J. LEDFORD 01/01/23 @ 0330 BY AB Performed at [facility], [address] [place], [place] [address]    Culture GRAM POSITIVE COCCI  Final   Report Status PENDING  Incomplete  Blood Culture ID Panel (Reflexed)     Status: Abnormal   Collection Time: 12/31/22  7:37 AM  Result Value Ref Range Status   Enterococcus faecalis NOT DETECTED NOT DETECTED Final   Enterococcus Faecium NOT DETECTED NOT DETECTED Final   Listeria monocytogenes NOT DETECTED NOT DETECTED  Final   Staphylococcus species DETECTED (A) NOT DETECTED Final    Comment: CRITICAL RESULT CALLED TO, READ BACK BY AND VERIFIED WITH: PHARMD J. LEDFORD 01/01/23 @ 0330 BY AB    Staphylococcus aureus (BCID) DETECTED (A) NOT DETECTED Final    Comment: Methicillin (oxacillin)-resistant Staphylococcus aureus (MRSA). MRSA is predictably resistant to beta-lactam antibiotics (except ceftaroline). Preferred therapy is vancomycin unless clinically contraindicated. Patient requires contact precautions if  hospitalized. CRITICAL RESULT CALLED TO, READ BACK BY AND VERIFIED WITH: PHARMD J. LEDFORD 01/01/23 @ 0330 BY AB    Staphylococcus epidermidis NOT DETECTED NOT DETECTED Final   Staphylococcus lugdunensis NOT DETECTED NOT DETECTED Final   Streptococcus species NOT DETECTED NOT DETECTED Final   Streptococcus agalactiae NOT DETECTED NOT DETECTED Final   Streptococcus pneumoniae NOT DETECTED NOT DETECTED Final   Streptococcus pyogenes NOT DETECTED NOT DETECTED Final   A.calcoaceticus-baumannii NOT DETECTED NOT DETECTED Final   Bacteroides fragilis NOT DETECTED NOT DETECTED Final   Enterobacterales NOT DETECTED NOT DETECTED Final   Enterobacter cloacae complex NOT DETECTED NOT DETECTED Final   Escherichia coli NOT DETECTED NOT DETECTED Final   Klebsiella aerogenes NOT DETECTED NOT DETECTED Final   Klebsiella oxytoca NOT DETECTED NOT DETECTED Final   Klebsiella pneumoniae NOT DETECTED NOT DETECTED Final   Proteus species NOT DETECTED NOT DETECTED Final   Salmonella species NOT DETECTED NOT DETECTED Final   Serratia marcescens NOT DETECTED NOT DETECTED Final   Haemophilus influenzae NOT DETECTED NOT DETECTED Final   Neisseria meningitidis NOT DETECTED NOT DETECTED Final   Pseudomonas aeruginosa NOT DETECTED NOT DETECTED Final   Stenotrophomonas maltophilia NOT DETECTED NOT DETECTED Final   Candida albicans NOT DETECTED NOT DETECTED Final   Candida auris NOT DETECTED NOT DETECTED Final   Candida  glabrata NOT DETECTED NOT DETECTED Final   Candida krusei NOT DETECTED NOT DETECTED Final   Candida parapsilosis NOT DETECTED NOT DETECTED Final   Candida tropicalis NOT DETECTED NOT DETECTED Final   Cryptococcus neoformans/gattii NOT DETECTED NOT DETECTED Final   Meth resistant mecA/C and MREJ DETECTED (A) NOT DETECTED Final    Comment: CRITICAL RESULT CALLED TO, READ BACK BY AND VERIFIED WITH: PHARMD J. LEDFORD 01/01/23 @ 0330 BY AB Performed at [facility], [address], [place], [place] [address]   Culture, blood (Routine X 2) w Reflex to ID Panel  Status: None (Preliminary result)   Collection Time: 12/31/22  7:43 AM   Specimen: BLOOD RIGHT HAND  Result Value Ref Range Status   Specimen Description BLOOD RIGHT HAND  Final   Special Requests   Final    BOTTLES DRAWN AEROBIC ONLY Blood Culture adequate volume   Culture   Final    NO GROWTH 1 DAY Performed at [facility], [address], [place], [place] [address]    Report Status PENDING  Incomplete         Radiology Studies: DG Abd Portable 1V  Result Date: 01/01/2023 CLINICAL DATA:  Feeding tube placement EXAM: PORTABLE ABDOMEN - 1 VIEW COMPARISON:  04/30/2018 FINDINGS: AICD leads noted. A feeding tube is present with tip in the stomach antrum. Indistinct left hemidiaphragm, cannot exclude mild left basilar atelectasis. IMPRESSION: 1. Feeding tube tip in the stomach antrum. Electronically Signed   By: Gaylyn Rong M.D.   On: 01/01/2023 16:31   DG CHEST PORT 1 VIEW  Result Date: 01/01/2023 CLINICAL DATA:  Possible density in right upper lobe seen in CT thoracic spine done on 12/31/2022 EXAM: PORTABLE CHEST 1 VIEW COMPARISON:  Previous studies including this CT thoracic spine done on 12/31/2022 FINDINGS: Transverse diameter of heart is increased. Thoracic aorta is tortuous and ectatic. Pacemaker/defibrillator battery is seen in the left infraclavicular region. Small faint patchy density is seen in right  parahilar region. There is interval appearance of moderate sized area of increased markings in left lower lung field. Left lateral CP angle is indistinct. There is no pneumothorax. IMPRESSION: Small faint patchy density in right parahilar region may suggest subsegmental atelectasis. There is moderate sized new infiltrate in left lower lung field suggesting atelectasis/pneumonia. Small left pleural effusion. Electronically Signed   By: Ernie Avena M.D.   On: 01/01/2023 13:51   CT CERVICAL SPINE WO CONTRAST  Result Date: 12/31/2022 CLINICAL DATA:  neck pain with bacteremia, ? discitis; Bacteremia. ? discitis EXAM: CT CERVICAL SPINE WITHOUT CONTRAST CT THORACIC SPINE WITHOUT CONTRAST TECHNIQUE: Multidetector CT imaging of the cervical and thoracic spine was performed without contrast. Multiplanar CT image reconstructions were also generated. RADIATION DOSE REDUCTION: This exam was performed according to the departmental dose-optimization program which includes automated exposure control, adjustment of the mA and/or kV according to patient size and/or use of iterative reconstruction technique. COMPARISON:  None Available. FINDINGS: CT CERVICAL SPINE FINDINGS Alignment: Normal. Skull base and vertebrae: C3 inferior endplate and C4 super endplate Schmorl nodes. Multilevel moderate degenerative changes spine. Associated severe osseous neural foraminal stenosis at the left C6-C7 level. No associated severe osseous central canal stenosis. No acute fracture. No aggressive appearing focal osseous lesion or focal pathologic process. Soft tissues and spinal canal: No prevertebral fluid or swelling. No visible canal hematoma. Upper chest: Unremarkable. Other: None. CT THORACIC SPINE FINDINGS Alignment: Normal. Vertebrae: Diffusely decreased bone density. Multilevel mild degenerative changes of the spine. No acute fracture or focal pathologic process. Paraspinal and other soft tissues: Negative. Disc levels: Grossly  maintained. Other: Atherosclerotic plaque. Four-vessel coronary artery calcification. Emphysematous changes. Bilateral small pleural effusions. Interval development of poorly visualized nodular like right upper lobe pulmonary opacity (5:75). Cholelithiasis. Left maxillary sinus mucosal thickening. Atherosclerotic calcifications are present within the cavernous internal carotid arteries. Left carotid artery stent within the neck. IMPRESSION: 1. No CT findings suggest discitis. 2. No acute displaced fracture or traumatic listhesis of the cervical and thoracic spine. 3. Multilevel moderate degenerative changes spine. Associated severe osseous neural foraminal stenosis at  the left C6-C7 level. 4. Interval development of poorly visualized nodular like right upper lobe pulmonary opacity. Correlate with chest x-ray. 5. Aortic Atherosclerosis (ICD10-I70.0) and Emphysema (ICD10-J43.9). 6. Cholelithiasis. 7. Left carotid artery stent within the neck. Incomplete evaluation on this noncontrast study. Electronically Signed   By: Tish Frederickson M.D.   On: 12/31/2022 21:19   CT THORACIC SPINE WO CONTRAST  Result Date: 12/31/2022 CLINICAL DATA:  neck pain with bacteremia, ? discitis; Bacteremia. ? discitis EXAM: CT CERVICAL SPINE WITHOUT CONTRAST CT THORACIC SPINE WITHOUT CONTRAST TECHNIQUE: Multidetector CT imaging of the cervical and thoracic spine was performed without contrast. Multiplanar CT image reconstructions were also generated. RADIATION DOSE REDUCTION: This exam was performed according to the departmental dose-optimization program which includes automated exposure control, adjustment of the mA and/or kV according to patient size and/or use of iterative reconstruction technique. COMPARISON:  None Available. FINDINGS: CT CERVICAL SPINE FINDINGS Alignment: Normal. Skull base and vertebrae: C3 inferior endplate and C4 super endplate Schmorl nodes. Multilevel moderate degenerative changes spine. Associated severe  osseous neural foraminal stenosis at the left C6-C7 level. No associated severe osseous central canal stenosis. No acute fracture. No aggressive appearing focal osseous lesion or focal pathologic process. Soft tissues and spinal canal: No prevertebral fluid or swelling. No visible canal hematoma. Upper chest: Unremarkable. Other: None. CT THORACIC SPINE FINDINGS Alignment: Normal. Vertebrae: Diffusely decreased bone density. Multilevel mild degenerative changes of the spine. No acute fracture or focal pathologic process. Paraspinal and other soft tissues: Negative. Disc levels: Grossly maintained. Other: Atherosclerotic plaque. Four-vessel coronary artery calcification. Emphysematous changes. Bilateral small pleural effusions. Interval development of poorly visualized nodular like right upper lobe pulmonary opacity (5:75). Cholelithiasis. Left maxillary sinus mucosal thickening. Atherosclerotic calcifications are present within the cavernous internal carotid arteries. Left carotid artery stent within the neck. IMPRESSION: 1. No CT findings suggest discitis. 2. No acute displaced fracture or traumatic listhesis of the cervical and thoracic spine. 3. Multilevel moderate degenerative changes spine. Associated severe osseous neural foraminal stenosis at the left C6-C7 level. 4. Interval development of poorly visualized nodular like right upper lobe pulmonary opacity. Correlate with chest x-ray. 5. Aortic Atherosclerosis (ICD10-I70.0) and Emphysema (ICD10-J43.9). 6. Cholelithiasis. 7. Left carotid artery stent within the neck. Incomplete evaluation on this noncontrast study. Electronically Signed   By: Tish Frederickson M.D.   On: 12/31/2022 21:19           LOS: 4 days   Time spent= 35 mins    [employer] Joline Maxcy, MD [facility]  If 7PM-7AM, please contact night-coverage  01/02/2023, 7:47 AM \

## 2023-01-02 NOTE — Progress Notes (Signed)
Initial Nutrition Assessment  DOCUMENTATION CODES:   Severe malnutrition in context of chronic illness  INTERVENTION:   Continue tube feeds via Cortrak: - Change to Osmolite 1.5 @ 40 ml/hr. Titrate tube feeds by 10 ml every 8 hours to goal rate of 50 ml/hr (1200 ml/day). - Continue PROSource TF20 60 ml daily  Tube feeding regimen at goal rate provides 1880 kcal, 95 grams of protein, and 914 ml of H2O.  Monitor magnesium, potassium, and phosphorus every 12 hours for at least 6 occurrences. MD to replete as needed as pt is at risk for refeeding syndrome given severe malnutrition.  - Recommend supplementing phosphorus of 2.3  - Thiamine 100 mg daily per tube x 5 days due to refeeding risk  - Change MVI with minerals to renal MVI daily given CKD stage IIIb  - Offer Ensure Enlive po BID, each supplement provides 350 kcal and 20 grams of protein  - Feeding assistance with meals  NUTRITION DIAGNOSIS:   Severe Malnutrition related to chronic illness (HFrEF, COPD, CKD stage IIIb) as evidenced by severe fat depletion, severe muscle depletion.  GOAL:   Patient will meet greater than or equal to 90% of their needs  MONITOR:   PO intake, Supplement acceptance, Labs, Weight trends, TF tolerance  REASON FOR ASSESSMENT:   Consult Enteral/tube feeding initiation and management  ASSESSMENT:   82 year old male who presented to [facility] ED on 7/12 with SOB. PMH of HFrEF, COPD, atrial fibrillation, CKD stage IIIb, HTN, CAD s/p prior PCI. Pt admitted with acute on chronic combined systolic and diastolic CHF, acute on chronic respiratory failure with hypoxia, sepsis, MRSA bacteremia.  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 MD okay with  continuing continuous tube feeds at this time. MD hopeful for transition to nocturnal tube feeds soon given pt is on a PO diet. Pt appears to be refeeding on tube feeding protocol (Vital AF 1.2 @ 40 ml/hr) given drop in phosphorus to 2.3. Potassium and magnesium are WNL.  RD transitioning pt to Osmolite 1.5 tube feeding formula and slowly advancing to goal rate. Will continue to monitor for signs of refeeding. Once pt tolerating Osmolite 1.5 tube feeds at goal rate and electrolytes are WNL, can transition to nocturnal feeds.  Reviewed notes. Pt refused to eat meals yesterday despite Palliative and RN attempted to feed pt. Pt also refused to drink oral nutrition supplement.  RD met with pt at bedside. Breakfast meal tray in room but untouched. RD offered to feed pt, but pt refused. Pt reports poor appetite since admission to the hospital. He states that he usually eats okay but unable to provide more details.  Pt reports a UBW of 140 lbs. Current weight is up 6 kg this admission, likely due to volume status. Pt with moderate edema to BUE. Admission weight was 67.2 kg (148 lbs). Pt's weight has fluctuated between 55.9-73.5 kg over the last 1 year. Based on NFPE, pt meets criteria for severe malnutrition.  RD will order oral nutrition supplements in an effort to promote PO intake. Will monitor for ability to transition to nocturnal tube feeds. Palliative Medicine Team is following.  Admit weight: 67.2 kg Current weight: 73.5 kg  Current TF: Vital AF 1.2 @ 40 ml/hr, PROSource TF20 60  ml daily  Meal Completion:  7/15: 50% x 1 meal  7/16: 10%, 0%  7/17: no documentation available  Medications reviewed and include: MVI with minerals, protonix, sucralfate, IV abx  Labs reviewed: creatinine 1.64, phosphorus 2.3, WBC 11.8, hemoglobin 8.3 CBG's: <10-175 x 24 hours  UOP: 1150 ml x 24 hours  NUTRITION - FOCUSED PHYSICAL EXAM:  Flowsheet Row Most Recent Value  Orbital Region Severe depletion   Upper Arm Region Mild depletion  Thoracic and Lumbar Region Moderate depletion  Buccal Region Severe depletion  Temple Region Severe depletion  Clavicle Bone Region Moderate depletion  Clavicle and Acromion Bone Region Severe depletion  Scapular Bone Region Moderate depletion  Dorsal Hand Moderate depletion  Patellar Region Moderate depletion  Anterior Thigh Region Severe depletion  Posterior Calf Region Moderate depletion  Edema (RD Assessment) Moderate  [BUE]  Hair Reviewed  Eyes Reviewed  Mouth Reviewed  Skin Reviewed  Nails Reviewed    Diet Order:   Diet Order             Diet regular Room service appropriate? Yes with Assist; Fluid consistency: Thin  Diet effective now                   EDUCATION NEEDS:   Not appropriate for education at this time  Skin:  Skin Assessment: Reviewed RN Assessment  Last BM:  01/01/23  Height:   Ht Readings from Last 1 Encounters:  12/27/22 5\' 8"  (1.727 m)    Weight:   Wt Readings from Last 1 Encounters:  01/02/23 73.5 kg    BMI:  Body mass index is 24.64 kg/m.  Estimated Nutritional Needs:   Kcal:  1750-1950  Protein:  90-110 grams  Fluid:  1.7-1.9 L    Mertie Clause, MS, RD, LDN Inpatient Clinical Dietitian Please see AMiON for contact information.

## 2023-01-02 NOTE — Progress Notes (Signed)
Physical Therapy Treatment Patient Details Name: Matthew Zamora MRN: [id number] DOB: [birth year] Today's Date: 01/02/2023   History of Present Illness 82 yo male admitted to [facility] 7/12 with SOB with decompensated HF. 7/13 hypotension with sepsis. 7/14 Rt weakness with code stroke and transfer to [facility], s/p Lt ICA thrombectomy with residual stenosis ultimately requiring left ICA stent placement.  Extubated post procedure but very confused. PMhx: COPD, CHF, AFib, CKD, HTN    PT Comments  Pt received in supine and agreeable to session. Pt reporting L shoulder pain, however yells throughout session with all movement and assist. Pt able to manage BLE some, but requires up to max A for bed mobility and min-mod A for sitting balance. Pt demonstrating a R lateral lean requiring assist to correct. Pt with limited sitting tolerance and requests to return to supine. Pt continues to be limited by pain, fatigue, and impaired cognition. Pt continues to benefit from PT services to progress toward functional mobility goals.      Assistance Recommended at Discharge Frequent or constant Supervision/Assistance  If plan is discharge home, recommend the following:  Can travel by private vehicle    A lot of help with walking and/or transfers;A lot of help with bathing/dressing/bathroom;Assistance with cooking/housework;Direct supervision/assist for medications management;Assist for transportation   Yes  Equipment Recommendations  None recommended by PT    Recommendations for Other Services       Precautions / Restrictions Precautions Precautions: Fall;Other (comment) Precaution Comments: art line Restrictions Weight Bearing Restrictions: No     Mobility  Bed Mobility Overal bed mobility: Needs Assistance Bed Mobility: Sit to Supine, Supine to Sit     Supine to sit: Max assist     General bed mobility comments: Pt able to advance BLE part of the way to EOB and elevate back to EOB, however  requires max A with the remainder of bed mobility. Pt requires min-mod A to maintain sitting balance    Transfers                   General transfer comment: unable        Balance Overall balance assessment: Needs assistance Sitting-balance support: No upper extremity supported, Feet supported Sitting balance-Leahy Scale: Zero Sitting balance - Comments: Pt requires consistent min-mod A to maintain sitting EOB. Pt's BUE sitting on EOB, but pt with little use for support                                    Cognition Arousal/Alertness: Awake/alert Behavior During Therapy: Agitated, Anxious Overall Cognitive Status: Impaired/Different from baseline                                 General Comments: Pt yelling throughout session with limited response to cues.        Exercises      General Comments        Pertinent Vitals/Pain Pain Assessment Pain Assessment: Faces Faces Pain Scale: Hurts whole lot Pain Location: L shoulder/elbow, BLE with movement, neck Pain Descriptors / Indicators: Discomfort, Grimacing, Moaning, Other (Comment) (yelling) Pain Intervention(s): Monitored during session, Limited activity within patient's tolerance, Repositioned     PT Goals (current goals can now be found in the care plan section) Acute Rehab PT Goals Patient Stated Goal: to return home PT Goal Formulation: With patient Time For Goal  Achievement: 01/13/23 Potential to Achieve Goals: Fair Progress towards PT goals: Not progressing toward goals - comment (limited by pain and impaired cognition)    Frequency    Min 3X/week      PT Plan Current plan remains appropriate       AM-PAC PT "6 Clicks" Mobility   Outcome Measure  Help needed turning from your back to your side while in a flat bed without using bedrails?: A Lot Help needed moving from lying on your back to sitting on the side of a flat bed without using bedrails?: A Lot Help  needed moving to and from a bed to a chair (including a wheelchair)?: Total Help needed standing up from a chair using your arms (e.g., wheelchair or bedside chair)?: Total Help needed to walk in hospital room?: Total Help needed climbing 3-5 steps with a railing? : Total 6 Click Score: 8    End of Session   Activity Tolerance: Patient limited by pain;Patient limited by fatigue Patient left: in bed;with call bell/phone within reach;with nursing/sitter in room Nurse Communication: Mobility status PT Visit Diagnosis: Muscle weakness (generalized) (M62.81);Difficulty in walking, not elsewhere classified (R26.2)     Time: 1610-9604 PT Time Calculation (min) (ACUTE ONLY): 18 min  Charges:    $Therapeutic Activity: 8-22 mins PT General Charges $$ ACUTE PT VISIT: 1 Visit                     Johny Shock, PTA Acute Rehabilitation Services Secure Chat Preferred  [phone]    Johny Shock 01/02/2023, 3:59 PM

## 2023-01-02 NOTE — Progress Notes (Signed)
Pharmacy Antibiotic Note  Matthew Zamora is a 82 y.o. male admitted on 12/29/2022 with bacteremia.  He was initially started on vancomycin and cefepime at [facility]. Pharmacy has been consulted to re-initiate vancomycin for MRSA bacteremia.   Vancomycin trough 18 mcg/ml (therapeutic) on 1000mg  IV q24h  Plan: Continue vancomycin 1000mg  IV Q24H.  Monitor renal fxn, repeat BCx, and vanc levels as indicated.  Weight: 73.5 kg (162 lb 0.6 oz)  Temp (24hrs), Avg:98.2 F (36.8 C), Min:97.4 F (36.3 C), Max:98.9 F (37.2 C)  Recent Labs  Lab 12/28/22 1340 12/29/22 0836 12/29/22 1824 12/29/22 2233 12/30/22 0322 12/31/22 0519 01/01/23 0113 01/02/23 0643 01/02/23 2012  WBC  --  12.9*  --   --  9.0 9.4 11.3* 11.8*  --   CREATININE  --  2.12*  --  1.86* 1.73* 1.75* 1.63* 1.64*  --   LATICACIDVEN 1.6  --   --   --   --   --   --   --   --   VANCOTROUGH  --   --   --   --   --   --   --   --  18  VANCORANDOM  --   --  10  --   --   --   --   --   --     Estimated Creatinine Clearance: 34.8 mL/min (A) (by C-G formula based on SCr of 1.64 mg/dL (H)).    Allergies  Allergen Reactions   Nitrofuran Derivatives Other (See Comments)    Transaminitis ** confounded w/Amiodarone, Mexiletine   Amiodarone Other (See Comments)    Unknown    Spironolactone Other (See Comments)    Significant transaminitis     Vanc 7/13, 7/14 >> Cefepime x1 7/13  7/13 Vanc 1500mg  at 1437 (SCr 1.76) 7/14 VR = 10 mcg/mL (30 hrs from loading dose) >> 1250mg  at 2100 (SCr 2.12) 7/18 Vanc trough = 18 mcg/ml (THERAPEUTIC)  7/13 BCx - MRSA  7/14 MRSA PCR - positive  Christoper Fabian, PharmD, BCPS Please see amion for complete clinical pharmacist phone list 01/02/2023, 10:09 PM

## 2023-01-02 NOTE — Progress Notes (Signed)
Brief ID Note:   Matthew Zamora is a 82 y.o. male with MRSA bacteremia with cardiac device present.   Repeat blood cultures from 7/16 now re-growing MRSA in 1/2 sites. Persistently positive results suggest deeper source of infection. Less bacterial burden so hopeful sign that he is clearing - will recollect another set today and continue on vancomycin for now. If persistently positive will consider combination therapy daptomycin + ceftaroline to see if that helps to achieve clearance.   Will follow for plans to get TEE.   CT scan reviewed - no signs of OM/Diskitis/septic arthritis; severe c5-6 foraminal narrowing. If he continues to have neck pain may consider repeating CT scan to follow for changes given not as sensitive as MRI. Will check CRP to see if we can usefully trend during treatment.    Rexene Alberts, MSN, NP-C [facility] for Infectious Disease [facility]  [email or web address] Pager: [phone] Office: [phone] [facility] Main Line: [phone] *Secure Chat Communication Welcome

## 2023-01-02 NOTE — Consult Note (Signed)
General Cardiology Consult:   Patient ID: Matthew Zamora; MRN: [id number]; DOB: [birth year]   Admission date: 12/29/2022  Primary Care Provider: Jerl Mina, MD Primary Cardiologist: [facility]- Dr. Dayle Points Primary Electrophysiologist:  Dr. Lalla Brothers  Consult question:  AF management, discuss utility of TEE at the request of Dr. Nelson Chimes  [name] is a 82 y.o. male with a history of Htn, HLD, CAD with prior PCI, ICM, VT and ICD in situ and prior amiodarone induced tranaminitis.  Has hx of PAF and has had AFL through this admission.    History of Present Illness:   Matthew Zamora has had a complicated course: at presentation of this admission was found to have a stroke.  He has a L ICA Thrombus and is s/p endovascular mechanical thrombectomy.  And 12/29/22 CAS.    Also found to have bacteremia.  Given his MRSA his device was presumed to be infected.  He was seen by EP service: he was not deemed to be a candidate for device extraction in the setting of mental status, anemia, thrombocytopenia, severe hypotension, inability to come of DAPT.  If these items recovered EP service recommend TEE before consideration for extraction (would also deed extraction CT).  Some providers has noted that he has continued to improved.  Others note a gradual decline with increased confusion, and decreased appetite.  At baseline he lives alone with home health support and support from DIL. Lost IV access in 20M, had one IV now.  Patient notes that he is Matthew Zamora. He has some heart problems and is here for stroke. He is having elbow and shoulder pain.  He is having abdominal pain.  He is orient to place and person.  Not to time. He says he hollers.  Allergies:    Allergies  Allergen Reactions   Nitrofuran Derivatives Other (See Comments)    Transaminitis ** confounded w/Amiodarone, Mexiletine   Amiodarone Other (See Comments)    Unknown    Spironolactone Other (See  Comments)    Significant transaminitis     Social History:   Social History   Socioeconomic History   Marital status: Single    Spouse name: Not on file   Number of children: Not on file   Years of education: Not on file   Highest education level: Not on file  Occupational History   Not on file  Tobacco Use   Smoking status: Former    Current packs/day: 0.00    Average packs/day: 1 pack/day for 38.0 years (38.0 ttl pk-yrs)    Types: Cigarettes    Start date: 06/17/1961    Quit date: 06/18/1999    Years since quitting: 23.5   Smokeless tobacco: Never  Vaping Use   Vaping status: Never Used  Substance and Sexual Activity   Alcohol use: No    Alcohol/week: 0.0 standard drinks of alcohol   Drug use: Not Currently   Sexual activity: Not Currently  Other Topics Concern   Not on file  Social History Narrative   Not on file   Social Determinants of Health   Financial Resource Strain: Low Risk  (12/05/2021)   Received from [facility]   Overall Financial Resource Strain (CARDIA)    Difficulty of Paying Living Expenses: Not hard at all  Food Insecurity: No Food Insecurity (12/17/2022)   Hunger Vital Sign    Worried About Running Out of Food in the Last Year: Never true  Ran Out of Food in the Last Year: Never true  Transportation Needs: No Transportation Needs (12/17/2022)   PRAPARE - Administrator, Civil Service (Medical): No    Lack of Transportation (Non-Medical): No  Physical Activity: Not on file  Stress: Not on file  Social Connections: Not on file  Intimate Partner Violence: Not At Risk (12/17/2022)   Humiliation, Afraid, Rape, and Kick questionnaire    Fear of Current or Ex-Partner: No    Emotionally Abused: No    Physically Abused: No    Sexually Abused: No    Family History:   The patient's family history includes Heart attack in his father and mother.    ROS:  Please see the history of present illness.   Physical Exam/Data:    Vitals:   01/02/23 0908 01/02/23 1011 01/02/23 1056 01/02/23 1101  BP:  (!) 145/85 138/76   Pulse: (!) 115 (!) 122 (!) 129 (!) 120  Resp: (!) 21 (!) 22 (!) 27 19  Temp:  98 F (36.7 C) 98.8 F (37.1 C)   TempSrc:  Axillary Axillary   SpO2: 98% 97% 98% 97%  Weight:        Intake/Output Summary (Last 24 hours) at 01/02/2023 1510 Last data filed at 01/02/2023 1059 Gross per 24 hour  Intake 950.83 ml  Output 600 ml  Net 350.83 ml   Filed Weights   12/30/22 0500 01/01/23 0500 01/02/23 0453  Weight: 70.5 kg 73 kg 73.5 kg   Body mass index is 24.64 kg/m.   Gen: chronically ill, malnourished   Neck: No JVD Ears:  Homero Fellers Sign Cardiac: No Rubs or Gallops, no murmur, regular tachycardia, +2 radial pulses Respiratory: Clear to auscultation bilaterally GI: Soft, tender, non-distended  MS: Leg edema , pressure ulcers well dressed on legs and heels Integument: Skin feels warm  EKG:  The ECG that was done  was personally reviewed and demonstrates Atrial Flutter in 2:1 Condution, rate ~ 120 bpm  Relevant CV Studies:  Cardiac Studies & Procedures       ECHOCARDIOGRAM  ECHOCARDIOGRAM COMPLETE 12/29/2022  Narrative ECHOCARDIOGRAM REPORT    Patient Name:   Matthew Zamora Date of Exam: 12/29/2022 Medical Rec #:  [id number]           Height:       68.0 in Accession #:    [id number]          Weight:       143.7 lb Date of Birth:  1940/10/05           BSA:          1.776 m Patient Age:    81 years            BP:           77/49 mmHg Patient Gender: M                   HR:           133 bpm. Exam Location:  [facility]  Procedure: 2D Echo  Indications:     CHF I50.21  History:         Patient has prior history of Echocardiogram examinations, most recent 11/10/2022.  Sonographer:     Overton Mam RDCS, FASE Referring Phys:  [id number] Hubbard Hartshorn OUMA Diagnosing Phys: Chilton Si MD   Sonographer Comments: Technically challenging study due to limited acoustic  windows and suboptimal parasternal window. Image acquisition challenging due to uncooperative patient.  Unable to perform a complete echo at this time due to patient being extremely uncooperative. IMPRESSIONS   1. Global hypokinesis worse in the septum. Left ventricular ejection fraction, by estimation, is 30 to 35%. The left ventricle has moderately decreased function. The left ventricle demonstrates global hypokinesis. There is mild left ventricular hypertrophy of the septal segment. Left ventricular diastolic parameters are indeterminate. 2. Right ventricular systolic function is normal. The right ventricular size is normal. 3. The mitral valve is normal in structure. Mild mitral valve regurgitation. No evidence of mitral stenosis. 4. The aortic valve is normal in structure. Aortic valve regurgitation is not visualized. No aortic stenosis is present. 5. The inferior vena cava is normal in size with greater than 50% respiratory variability, suggesting right atrial pressure of 3 mmHg.  FINDINGS Left Ventricle: Global hypokinesis worse in the septum. Left ventricular ejection fraction, by estimation, is 30 to 35%. The left ventricle has moderately decreased function. The left ventricle demonstrates global hypokinesis. The left ventricular internal cavity size was normal in size. There is mild left ventricular hypertrophy of the septal segment. Left ventricular diastolic parameters are indeterminate.  Right Ventricle: The right ventricular size is normal. No increase in right ventricular wall thickness. Right ventricular systolic function is normal.  Left Atrium: Left atrial size was normal in size.  Right Atrium: Right atrial size was normal in size.  Pericardium: There is no evidence of pericardial effusion.  Mitral Valve: The mitral valve is normal in structure. Mild mitral valve regurgitation. No evidence of mitral valve stenosis.  Tricuspid Valve: The tricuspid valve is normal in  structure. Tricuspid valve regurgitation is trivial. No evidence of tricuspid stenosis.  Aortic Valve: The aortic valve is normal in structure. Aortic valve regurgitation is not visualized. No aortic stenosis is present. Aortic valve peak gradient measures 7.2 mmHg.  Pulmonic Valve: The pulmonic valve was normal in structure. Pulmonic valve regurgitation is not visualized. No evidence of pulmonic stenosis.  Aorta: The aortic root is normal in size and structure.  Venous: The inferior vena cava is normal in size with greater than 50% respiratory variability, suggesting right atrial pressure of 3 mmHg.  IAS/Shunts: No atrial level shunt detected by color flow Doppler.   LEFT VENTRICLE PLAX 2D LVIDd:         5.20 cm     Diastology LVIDs:         4.40 cm     LV e' medial:    8.38 cm/s LV PW:         1.00 cm     LV E/e' medial:  13.6 LV IVS:        1.20 cm     LV e' lateral:   13.90 cm/s LVOT diam:     1.80 cm     LV E/e' lateral: 8.2 LVOT Area:     2.54 cm  LV Volumes (MOD) LV vol d, MOD A4C: 74.3 ml LV vol s, MOD A4C: 48.7 ml LV SV MOD A4C:     74.3 ml  LEFT ATRIUM         Index LA diam:    4.00 cm 2.25 cm/m AORTIC VALVE              PULMONIC VALVE AV Vmax:      134.00 cm/s PV Vmax:       0.93 m/s AV Peak Grad: 7.2 mmHg    PV Peak grad:  3.5 mmHg  AORTA Ao Root diam: 3.60 cm  MITRAL  VALVE                TRICUSPID VALVE MV Area (PHT): 6.02 cm     TR Peak grad:   23.2 mmHg MV Decel Time: 126 msec     TR Vmax:        241.00 cm/s MV E velocity: 114.00 cm/s SHUNTS Systemic Diam: 1.80 cm  Chilton Si MD Electronically signed by Chilton Si MD Signature Date/Time: 12/29/2022/9:43:31 AM    Final             Laboratory Data:  Chemistry Recent Labs  Lab 01/01/23 0113 01/02/23 0643  NA 135 135  K 4.5 4.1  CL 111 110  CO2 16* 19*  GLUCOSE 79 117*  BUN 22 23  CREATININE 1.63* 1.64*  CALCIUM 8.6* 8.6*  GFRNONAA 42* 42*  ANIONGAP 8 6    Recent Labs   Lab 12/28/22 0409 12/29/22 0836  PROT 5.7* 5.1*  ALBUMIN 3.3* 2.8*  AST 22 28  ALT 21 20  ALKPHOS 66 53  BILITOT 0.9 0.9   Hematology Recent Labs  Lab 01/01/23 0113 01/02/23 0643  WBC 11.3* 11.8*  RBC 2.76* 2.91*  HGB 8.1* 8.3*  HCT 26.7* 27.1*  MCV 96.7 93.1  MCH 29.3 28.5  MCHC 30.3 30.6  RDW 15.9* 16.1*  PLT 120* 167   Cardiac EnzymesNo results for input(s): "TROPONINI" in the last 168 hours. No results for input(s): "TROPIPOC" in the last 168 hours.  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  - ICD should be presumed to be infected - Per EP not a candidate for extraction due to high risk - his platelets are improving, his anemia is stabilizing, his blood pressure his improving on high dose midodrine and pydriostigmine - When his mental status improves he will need to be cleared to come off DAPT and eliquis (from either medicine, vascular surgery and/or neurology); once that occurs, a TEE would be reasonable to evaluate the ICD lead; my clinical suspicion is that instead long term antiobiotics should be used as I am not sure he will ever be a candidate for device   Chronic heart failure with reduced ejection fraction, EF 30% - unable to tolerate diuresis or GDMT in the setting of hypotension; on midodrine  Left ICA thrombus, s/p thrombectomy and left ICA stent  placement - as per neurology  Palliative Care is following; a more conservative approach may be reasonable   As per primary and associated team - Acute on chronic CKD stage IIIb - NAGMA - Hyponatremia, improving - GERD  - Depression/anxiety      For questions or updates, please contact [facility] Please consult [email or web address] for contact info under Cardiology/STEMI.   Riley Lam, MD FASE [facility] Cardiologist [facility]  [address] [place], [address] [place], [place] [address] [phone]  [phone]  3:10 PM

## 2023-01-02 NOTE — Progress Notes (Signed)
eLink Physician-Brief Progress Note Patient Name: Matthew Zamora DOB: 01-Apr-1941 MRN: [id number]   Date of Service  01/02/2023  HPI/Events of Note  Chronic atrial fibrillation on Lopressor 50 mg BID, heart rate 130's despite oral beta blocker Rx, SBP 117.  eICU Interventions  PRN IV Lopressor ordered.        [name] [name] 01/02/2023, 2:27 AM

## 2023-01-02 NOTE — Care Management Important Message (Signed)
Important Message  Patient Details  Name: Matthew Zamora MRN: [id number] Date of Birth: 12/06/1940   Medicare Important Message Given:  Yes Patient left prior to IM will mail a copy to the patient home address.     Shamon Lobo 01/02/2023, 2:09 PM

## 2023-01-03 ENCOUNTER — Other Ambulatory Visit (HOSPITAL_COMMUNITY): Payer: Self-pay

## 2023-01-03 ENCOUNTER — Inpatient Hospital Stay (HOSPITAL_COMMUNITY): Payer: Medicare Other

## 2023-01-03 DIAGNOSIS — R7881 Bacteremia: Secondary | ICD-10-CM | POA: Diagnosis not present

## 2023-01-03 DIAGNOSIS — T827XXA Infection and inflammatory reaction due to other cardiac and vascular devices, implants and grafts, initial encounter: Secondary | ICD-10-CM

## 2023-01-03 DIAGNOSIS — B9562 Methicillin resistant Staphylococcus aureus infection as the cause of diseases classified elsewhere: Secondary | ICD-10-CM | POA: Diagnosis not present

## 2023-01-03 LAB — CBC
HCT: 29.1 % — ABNORMAL LOW (ref 39.0–52.0)
Hemoglobin: 8.7 g/dL — ABNORMAL LOW (ref 13.0–17.0)
MCH: 28 pg (ref 26.0–34.0)
MCHC: 29.9 g/dL — ABNORMAL LOW (ref 30.0–36.0)
MCV: 93.6 fL (ref 80.0–100.0)
Platelets: 207 10*3/uL (ref 150–400)
RBC: 3.11 MIL/uL — ABNORMAL LOW (ref 4.22–5.81)
RDW: 16 % — ABNORMAL HIGH (ref 11.5–15.5)
WBC: 11.4 10*3/uL — ABNORMAL HIGH (ref 4.0–10.5)
nRBC: 0 % (ref 0.0–0.2)

## 2023-01-03 LAB — CULTURE, BLOOD (ROUTINE X 2)
Culture: NO GROWTH
Special Requests: ADEQUATE

## 2023-01-03 LAB — BASIC METABOLIC PANEL
Anion gap: 8 (ref 5–15)
BUN: 26 mg/dL — ABNORMAL HIGH (ref 8–23)
CO2: 20 mmol/L — ABNORMAL LOW (ref 22–32)
Calcium: 9.1 mg/dL (ref 8.9–10.3)
Chloride: 110 mmol/L (ref 98–111)
Creatinine, Ser: 1.39 mg/dL — ABNORMAL HIGH (ref 0.61–1.24)
GFR, Estimated: 51 mL/min — ABNORMAL LOW (ref >60)
Glucose, Bld: 99 mg/dL (ref 70–99)
Potassium: 4.4 mmol/L (ref 3.5–5.1)
Sodium: 138 mmol/L (ref 135–145)

## 2023-01-03 LAB — GLUCOSE, CAPILLARY
Glucose-Capillary: 99 mg/dL (ref 70–99)
Glucose-Capillary: 90 mg/dL (ref 70–99)
Glucose-Capillary: 123 mg/dL — ABNORMAL HIGH (ref 70–99)
Glucose-Capillary: 172 mg/dL — ABNORMAL HIGH (ref 70–99)
Glucose-Capillary: 132 mg/dL — ABNORMAL HIGH (ref 70–99)

## 2023-01-03 LAB — PHOSPHORUS: Phosphorus: 2.4 mg/dL — ABNORMAL LOW (ref 2.5–4.6)

## 2023-01-03 LAB — MAGNESIUM: Magnesium: 2 mg/dL (ref 1.7–2.4)

## 2023-01-03 MED ORDER — AMIODARONE HCL IN DEXTROSE 360-4.14 MG/200ML-% IV SOLN
INTRAVENOUS | Status: AC
  Filled 2023-01-03: qty 200

## 2023-01-03 MED ORDER — AMIODARONE LOAD VIA INFUSION
150.0000 mg | Freq: Once | INTRAVENOUS | Status: AC
  Administered 2023-01-03: 150 mg via INTRAVENOUS
  Filled 2023-01-03: qty 83.34

## 2023-01-03 MED ORDER — AMIODARONE HCL IN DEXTROSE 360-4.14 MG/200ML-% IV SOLN
60.0000 mg/h | INTRAVENOUS | Status: DC
  Administered 2023-01-03 (×2): 60 mg/h via INTRAVENOUS

## 2023-01-03 MED ORDER — MIDODRINE HCL 5 MG PO TABS
5.0000 mg | ORAL_TABLET | Freq: Three times a day (TID) | ORAL | Status: DC
  Administered 2023-01-03 – 2023-01-12 (×28): 5 mg via ORAL
  Filled 2023-01-03 (×27): qty 1

## 2023-01-03 MED ORDER — ACETAMINOPHEN 500 MG PO TABS
1000.0000 mg | ORAL_TABLET | Freq: Three times a day (TID) | ORAL | Status: DC
  Administered 2023-01-03 – 2023-01-15 (×31): 1000 mg via ORAL
  Filled 2023-01-03 (×30): qty 2

## 2023-01-03 MED ORDER — AMIODARONE HCL IN DEXTROSE 360-4.14 MG/200ML-% IV SOLN
30.0000 mg/h | INTRAVENOUS | Status: DC
  Administered 2023-01-04 – 2023-01-06 (×5): 30 mg/h via INTRAVENOUS
  Filled 2023-01-03 (×6): qty 200

## 2023-01-03 MED ORDER — MIDODRINE HCL 5 MG PO TABS: 5.0000 mg | ORAL_TABLET | Freq: Three times a day (TID) | ORAL | Status: DC

## 2023-01-03 NOTE — Procedures (Signed)
Cortrak  Person Inserting Tube:  Maylon Peppers C, RD Tube Type:  Cortrak - 43 inches Tube Size:  10 Tube Location:  Left nare Secured by: Bridle Technique Used to Measure Tube Placement:  Marking at nare/corner of mouth Cortrak Secured At:  65 cm   Cortrak Tube Team Note:  Consult received to place a Cortrak feeding tube.   X-ray is required, abdominal x-ray has been ordered by the Cortrak team. Please confirm tube placement before using the Cortrak tube.   If the tube becomes dislodged please keep the tube and contact the Cortrak team at [email or web address] for replacement.  If after hours and replacement cannot be delayed, place a NG tube and confirm placement with an abdominal x-ray.    Lockie Pares., RD, LDN, CNSC See AMiON for contact information

## 2023-01-03 NOTE — Progress Notes (Signed)
Brief Nutrition Support Note  RD received secure chat from RN that pt pulled out his Cortrak yesterday evening. Discussed pt with RN. Pt continues to refuse to eat anything or drink oral nutrition supplements. Pt's only source of nutrition was from tube feeds via Cortrak.  Discussed the above with MD. Per MD, plan is to replace Cortrak today and resume tube feeds. Given pt is not eating anything at all, will continue with 24-hour feeds at this time. Discussed plan with RN.  Once Cortrak replaced and placement confirmed with abdominal x-ray, resume tube feeds: - Osmolite 1.5 @ 50 ml/hr (1200 ml/day) - PROSource TF20 60 ml daily  Tube feeding regimen provides 1880 kcal, 95 grams of protein, and 914 ml of H2O.  Continue to monitor magnesium, potassium, and phosphorus every 12 hours for at least 6 occurrences. MD to replete as needed as pt is at risk for refeeding syndrome given severe malnutrition. Labs for today are still pending.  RD will continue to follow pt during admission. Will monitor PO intake and for ability to transition to nocturnal tube feeds.   Mertie Clause, MS, RD, LDN Inpatient Clinical Dietitian Please see AMiON for contact information.

## 2023-01-03 NOTE — Progress Notes (Signed)
Rounding Note    Patient Name: Matthew Zamora Date of Encounter: 01/03/2023  [facility] Cardiologist: None  Subjective   C/o diffuse body pain, does not recall ICD shock last night (though RN reports he did call out in pain), can not tell me his name, able to tell me he is in [facility]  Inpatient Medications    Scheduled Meds:  apixaban  5 mg Oral BID   atorvastatin  80 mg Oral QHS   Chlorhexidine Gluconate Cloth  6 each Topical Q0600   diclofenac Sodium  4 g Topical QID   feeding supplement  237 mL Oral BID BM   feeding supplement (PROSource TF20)  60 mL Per Tube Daily   gabapentin  100 mg Oral QHS   metoprolol tartrate  75 mg Oral BID   midodrine  30 mg Oral Q8H   multivitamin  1 tablet Oral QHS   mupirocin ointment  1 Application Nasal BID   pantoprazole  40 mg Oral BID   pyridostigmine  60 mg Oral Q8H   sertraline  25 mg Oral Daily   sucralfate  1 g Oral TID WC & HS   thiamine  100 mg Per Tube Daily   ticagrelor  90 mg Oral BID   Or   ticagrelor  90 mg Per Tube BID   Continuous Infusions:  feeding supplement (OSMOLITE 1.5 CAL) Stopped (01/02/23 1751)   vancomycin 1,000 mg (01/02/23 2222)   PRN Meds: acetaminophen **OR** acetaminophen (TYLENOL) oral liquid 160 mg/5 mL **OR** acetaminophen, alum & mag hydroxide-simeth, docusate sodium, hydrALAZINE, hydrocortisone, hydrocortisone cream, levalbuterol, loratadine, metoprolol tartrate, Muscle Rub, ondansetron (ZOFRAN) IV, mouth rinse, oxyCODONE, polyethylene glycol, polyvinyl alcohol, senna-docusate, sodium chloride   Vital Signs    Vitals:   01/02/23 2323 01/03/23 0314 01/03/23 0459 01/03/23 0600  BP: (!) 141/46 (!) 138/58  (!) 150/68  Pulse: 87 78  67  Resp: 18 (!) 21  17  Temp: 98 F (36.7 C) 98.1 F (36.7 C)    TempSrc: Oral Oral    SpO2: 98% 97%  97%  Weight:   71.4 kg     Intake/Output Summary (Last 24 hours) at 01/03/2023 0853 Last data filed at 01/03/2023 0459 Gross per 24 hour  Intake  1095.83 ml  Output 1050 ml  Net 45.83 ml      01/03/2023    4:59 AM 01/02/2023    4:53 AM 01/01/2023    5:00 AM  Last 3 Weights  Weight (lbs) 157 lb 6.5 oz 162 lb 0.6 oz 160 lb 15 oz  Weight (kg) 71.4 kg 73.5 kg 73 kg      Telemetry    AFib, SR, VT - Personally Reviewed  ECG    No new EKGs - Personally Reviewed  Physical Exam   GEN: NAD, though hollering out for help with his remote, appears chronically ill Neck: No JVD Cardiac: RRR, no murmurs, rubs, or gallops.  Respiratory: CTA b/l (ant auscultation only). GI: Soft, nontender, non-distended  MS: RUE is swollen and erythematous, numerous b/l UE areas of ecchymosis, R hand is cool with excellent radial pulse Neuro:  AAO x1, follows commands Psych: Normal affect   Labs    High Sensitivity Troponin:   Recent Labs  Lab 12/27/22 1216 12/27/22 1416 12/27/22 2138 12/29/22 2233 12/30/22 0020  TROPONINIHS 29* 28* 30* 38* 38*     Chemistry Recent Labs  Lab 12/27/22 1216 12/28/22 0409 12/29/22 0836 12/29/22 2233 12/31/22 0519 01/01/23 0113 01/01/23 1734 01/02/23  9528 01/02/23 1734  NA 138 134* 132*   < > 134* 135  --  135  --   K 4.2 4.1 3.7   < > 3.7 4.5  --  4.1  --   CL 105 103 102   < > 109 111  --  110  --   CO2 25 24 21*   < > 18* 16*  --  19*  --   GLUCOSE 108* 102* 97   < > 81 79  --  117*  --   BUN 19 29* 39*   < > 24* 22  --  23  --   CREATININE 1.54* 1.76* 2.12*   < > 1.75* 1.63*  --  1.64*  --   CALCIUM 9.4 8.4* 7.7*   < > 8.4* 8.6*  --  8.6*  --   MG 1.9  --   --    < > 2.1 2.1 2.0 2.0 1.8  PROT 6.2* 5.7* 5.1*  --   --   --   --   --   --   ALBUMIN 3.5 3.3* 2.8*  --   --   --   --   --   --   AST 25 22 28   --   --   --   --   --   --   ALT 25 21 20   --   --   --   --   --   --   ALKPHOS 81 66 53  --   --   --   --   --   --   BILITOT 0.6 0.9 0.9  --   --   --   --   --   --   GFRNONAA 45* 39* 31*   < > 39* 42*  --  42*  --   ANIONGAP 8 7 9    < > 7 8  --  6  --    < > = values in this interval  not displayed.    Lipids  Recent Labs  Lab 12/30/22 0322  CHOL 103  TRIG 116  HDL 23*  LDLCALC 57  CHOLHDL 4.5    Hematology Recent Labs  Lab 01/01/23 0113 01/02/23 0643 01/03/23 0450  WBC 11.3* 11.8* 11.4*  RBC 2.76* 2.91* 3.11*  HGB 8.1* 8.3* 8.7*  HCT 26.7* 27.1* 29.1*  MCV 96.7 93.1 93.6  MCH 29.3 28.5 28.0  MCHC 30.3 30.6 29.9*  RDW 15.9* 16.1* 16.0*  PLT 120* 167 207   Thyroid No results for input(s): "TSH", "FREET4" in the last 168 hours.  BNP Recent Labs  Lab 12/27/22 1216  BNP 1,110.6*    DDimer  Recent Labs  Lab 12/30/22 0322  DDIMER 0.72*     Radiology    DG Abd Portable 1V Result Date: 01/01/2023 CLINICAL DATA:  Feeding tube placement EXAM: PORTABLE ABDOMEN - 1 VIEW COMPARISON:  04/30/2018 FINDINGS: AICD leads noted. A feeding tube is present with tip in the stomach antrum. Indistinct left hemidiaphragm, cannot exclude mild left basilar atelectasis. IMPRESSION: 1. Feeding tube tip in the stomach antrum. Electronically Signed   By: Gaylyn Rong M.D.   On: 01/01/2023 16:31   DG CHEST PORT 1 VIEW Result Date: 01/01/2023 CLINICAL DATA:  Possible density in right upper lobe seen in CT thoracic spine done on 12/31/2022 EXAM: PORTABLE CHEST 1 VIEW COMPARISON:  Previous studies including this CT thoracic spine done on 12/31/2022 FINDINGS: Transverse diameter of  heart is increased. Thoracic aorta is tortuous and ectatic. Pacemaker/defibrillator battery is seen in the left infraclavicular region. Small faint patchy density is seen in right parahilar region. There is interval appearance of moderate sized area of increased markings in left lower lung field. Left lateral CP angle is indistinct. There is no pneumothorax. IMPRESSION: Small faint patchy density in right parahilar region may suggest subsegmental atelectasis. There is moderate sized new infiltrate in left lower lung field suggesting atelectasis/pneumonia. Small left pleural effusion. Electronically  Signed   By: Ernie Avena M.D.   On: 01/01/2023 13:51    Cardiac Studies    12/29/22: TTE 1. Global hypokinesis worse in the septum. Left ventricular ejection  fraction, by estimation, is 30 to 35%. The left ventricle has moderately  decreased function. The left ventricle demonstrates global hypokinesis.  There is mild left ventricular  hypertrophy of the septal segment. Left ventricular diastolic parameters  are indeterminate.   2. Right ventricular systolic function is normal. The right ventricular  size is normal.   3. The mitral valve is normal in structure. Mild mitral valve  regurgitation. No evidence of mitral stenosis.   4. The aortic valve is normal in structure. Aortic valve regurgitation is  not visualized. No aortic stenosis is present.   5. The inferior vena cava is normal in size with greater than 50%  respiratory variability, suggesting right atrial pressure of 3 mmHg.  CARE EVERYWHERE note  Sept 2022. Mr. Bunton has had some episodes of AFib but also recently of Vtach. We were planning on getting a cardiac cath on him but he developed COVID. In the interim, his metoprolol was stopped and he had a few more episodes of VT. He is feeling better after the re-introduction of metoprolol. We reassessed his coronaries with minimal contrast ~ 20 ml and he had no new critical disease.   Patient Profile     82 y.o. male w/PMHx of O2 dep COPD, HTN, HLD, CAD (PCI > RCA 2019), ICM, ICD, VT, AFib   Mr. Shough noted to have had a few hospitalizations recently was in the hospital after a fall in May 2024  diagnosed with right fracture femoral patient treated underwent right Hemi arthroscopy transferred to rehab had some complications with recurrent atrial fibrillation patient received both inappropriate iCD therapy for rapid AFib as well as appropriate for VF by notes as well, started on amiodarone and transitioned to p.o. discharge    Readmitted to 11/10/22 with  acute/chronic CHF, pneumonia and rapid AFib, managed by his [place] team Noting that he required midodrine for his BP, limiting GDMT as well as his CKD. Amiodardone held 2/2 transaminitis > rate control strategy   Readmitted 12/17/22 with SOB > home O2 increased >> BIPAP felt to be COPD/CHF improved with IV duiretics > held with risoing Creat exacerbation Did have some RVR managed with diltiazem via IM. Cardiology brought on board rates mostly controlled reportedly with brief faster rates advised to continue his BB and f/u w/his [facility] team. Discharged 12/20/22   Readmitted 12/27/22 THIS admission SOB/CP, tachycardic 140's started on dilt gtt, acute/chronic CHF exacerbation (felt less COPD this admission), febrile to 103 Cardiology felt HR driven by sepsis, managed with amiodarone again and consideration for PRN dig MRSA bacteremia found Neurology consulted for code stroke with transient R sided weakness and confusion, neck pain. 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 with fluctuating symptoms overnight despite anticoagulation, suspect that this is an unstable thrombus. And in  thier discussion with neuro IR and vascular surgery, the decision was made to proceed with endovascular mechanical thrombectomy transported to [facility] for this procedure.  Not a TNK candidate There was mention of perhaps meningitis with his severe neck pain, not safe to LP given his anticoagulation, and need to continue anticoagulation.  Neuro favored increasing the blood level goal for his vancomycin to cover him in case he has seeded his meninges as well.  12/29/22 underwent complete revascularization of the  proximal left internal carotid artery with 1 pass with a 6 mm x 47 embotrap  retrieval device with contact aspiration, and proximal flow arrest revealing underlying severe stenosis at the distal aspect of the bulb. Status post stent assisted angioplasty of proximal Lt ICA stenosis with proximal and  distal protection. > cangrelor gtt > ASA, brilinta  EP consulted 12/30/22 for device in setting of MRSA bacteremia, at the time AAO x1, on pressor support, anemic/thrombocytopenic and s/p acute ICD stent unable to interrup DAPT >> not an ICD system extraction candidate  Now on Eliquis and Brilinta  12/31/22: BC remain MRSA positive 12/31/22 started on low dose BB   Pt has stopped accepting oral intake, >> [place] 01/01/23  01/01/23: palliative notes: ramins full code, d/w son (who's wife is an NP)  01/02/23 gen cards called for Afib management recs, his BB titrated noting allergy to amiodarone/mexiletine, preference to avoid dig.  Also commented once/or if his mental status improves would need to be cleared to come off antiplatelet and anticoagulant in order to pursue TEE  01/02/23  had sustained VT treated with 2 rounds of ATP > HV therapy Increased BB dose given after his VT Lopressor 75mg  BID  Remains on midodrine 30mg  ID (last dose so far was 7/18 am) >> BPs good 130's-150's  01/03/23: computer system down , labs are pending CBC is back WBC 11.4, H/H stable at 8.7/29, PLTs 207   Assessment & Plan    ICD/MRSA bacteremia VT Device is a MDT dual chamber ICD from 2019 Secondary prevention indication  Amiodarone and mexiletine are mentioned as allergy in note/history, unclear what the reaction is. Care everywhere notes: Nitrofuran analogues and Amiodarone analogues  reaction listed as  "Transaminitis" for amiodarone "Transaminitis ** confounded with amiodarone/mexiletine": for nitrofuran  LFTs here have been ok (last 7/14) Given his VT and full code status, will start amiodarone gtt > follow labs/LFTs  Given his recurrent VT, will add amio He is in SR with infrequent PVCs this morning  He clinically remains poor candidate for ICD system extraction He is clearly using his device No plans at this time for system extraction Discussed with ID NP  Paroxysmal Afib CHA2DS2Vasc is  5, on Eliquis   Further as per attending service RUE by RN is not new, with rep[orts of vanc IV infiltration Oriented to place, not self or otherwise   For questions or updates, please contact [facility] Please consult [email or web address] for contact info under        Signed, Sheilah Pigeon, PA-C  01/03/2023, 8:53 AM

## 2023-01-03 NOTE — TOC Progression Note (Signed)
Transition of Care [facility]) - Progression Note    Patient Details  Name: Matthew Zamora MRN: [id number] Date of Birth: 09-Feb-1941  Transition of Care [facility]) CM/SW Contact  Leander Rams, LCSW Phone Number: 01/03/2023, 3:41 PM  Clinical Narrative:    CSW faxed referral to [facility].   TOC will continue to follow.    Expected Discharge Plan: Skilled Nursing Facility Barriers to Discharge: Continued Medical Work up, SNF Pending bed offer  Expected Discharge Plan and Services     Post Acute Care Choice: Skilled Nursing Facility Living arrangements for the past 2 months: Single Family Home                                       Social Determinants of Health (SDOH) Interventions SDOH Screenings   Food Insecurity: No Food Insecurity (12/17/2022)  Housing: Low Risk  (12/17/2022)  Transportation Needs: No Transportation Needs (12/17/2022)  Utilities: Not At Risk (12/17/2022)  Financial Resource Strain: Low Risk  (12/05/2021)   Received from [facility]  Tobacco Use: Medium Risk (12/17/2022)    Readmission Risk Interventions    12/28/2022    9:53 AM 12/18/2022    4:18 PM 04/22/2022    3:54 PM  Readmission Risk Prevention Plan  Transportation Screening Complete Complete Complete  Medication Review Oceanographer) Complete Complete Complete  PCP or Specialist appointment within 3-5 days of discharge Complete Complete Complete  HRI or Home Care Consult Complete Complete Complete  SW Recovery Care/Counseling Consult Complete Complete Complete  Palliative Care Screening Not Applicable Not Applicable Not Applicable  Skilled Nursing Facility Complete Not Applicable Not Applicable   Oletta Lamas, MSW, LCSWA, LCASA Transitions of Care  Clinical Social Worker I

## 2023-01-03 NOTE — Plan of Care (Signed)
  Problem: Fluid Volume: Goal: Hemodynamic stability will improve Outcome: Progressing   Problem: Clinical Measurements: Goal: Diagnostic test results will improve Outcome: Progressing Goal: Signs and symptoms of infection will decrease Outcome: Progressing   Problem: Respiratory: Goal: Ability to maintain adequate ventilation will improve Outcome: Progressing   Problem: Education: Goal: Knowledge of General Education information will improve Description: Including pain rating scale, medication(s)/side effects and non-pharmacologic comfort measures Outcome: Not Progressing   Problem: Health Behavior/Discharge Planning: Goal: Ability to manage health-related needs will improve Outcome: Not Progressing   Problem: Clinical Measurements: Goal: Ability to maintain clinical measurements within normal limits will improve Outcome: Not Progressing Goal: Will remain free from infection Outcome: Not Progressing Goal: Diagnostic test results will improve Outcome: Progressing Goal: Respiratory complications will improve Outcome: Progressing Goal: Cardiovascular complication will be avoided Outcome: Not Progressing   Problem: Activity: Goal: Risk for activity intolerance will decrease Outcome: Not Progressing   Problem: Nutrition: Goal: Adequate nutrition will be maintained Outcome: Not Progressing   Problem: Coping: Goal: Level of anxiety will decrease Outcome: Not Progressing   Problem: Elimination: Goal: Will not experience complications related to bowel motility Outcome: Not Progressing Goal: Will not experience complications related to urinary retention Outcome: Not Progressing   Problem: Pain Managment: Goal: General experience of comfort will improve Outcome: Not Progressing   Problem: Safety: Goal: Ability to remain free from injury will improve Outcome: Not Progressing   Problem: Skin Integrity: Goal: Risk for impaired skin integrity will decrease Outcome:  Not Progressing   Problem: Education: Goal: Understanding of CV disease, CV risk reduction, and recovery process will improve Outcome: Not Progressing Goal: Individualized Educational Video(s) Outcome: Not Progressing   Problem: Activity: Goal: Ability to return to baseline activity level will improve Outcome: Not Progressing   Problem: Cardiovascular: Goal: Ability to achieve and maintain adequate cardiovascular perfusion will improve Outcome: Not Progressing Goal: Vascular access site(s) Level 0-1 will be maintained Outcome: Not Progressing   Problem: Health Behavior/Discharge Planning: Goal: Ability to safely manage health-related needs after discharge will improve Outcome: Not Progressing   Problem: Education: Goal: Knowledge of disease or condition will improve Outcome: Not Progressing Goal: Knowledge of secondary prevention will improve (MUST DOCUMENT ALL) Outcome: Not Progressing Goal: Knowledge of patient specific risk factors will improve Loraine Leriche N/A or DELETE if not current risk factor) Outcome: Not Progressing   Problem: Ischemic Stroke/TIA Tissue Perfusion: Goal: Complications of ischemic stroke/TIA will be minimized Outcome: Not Progressing   Problem: Coping: Goal: Will verbalize positive feelings about self Outcome: Not Progressing Goal: Will identify appropriate support needs Outcome: Not Progressing   Problem: Health Behavior/Discharge Planning: Goal: Ability to manage health-related needs will improve Outcome: Not Progressing Goal: Goals will be collaboratively established with patient/family Outcome: Not Progressing   Problem: Self-Care: Goal: Ability to participate in self-care as condition permits will improve Outcome: Not Progressing Goal: Verbalization of feelings and concerns over difficulty with self-care will improve Outcome: Not Progressing Goal: Ability to communicate needs accurately will improve Outcome: Not Progressing   Problem:  Nutrition: Goal: Risk of aspiration will decrease Outcome: Not Progressing

## 2023-01-03 NOTE — Progress Notes (Signed)
PROGRESS NOTE    Matthew Zamora  WJX:[id number] DOB: [birth year] DOA: 12/29/2022 PCP: Jerl Mina, MD   Brief Narrative:  82 year old with history of GERD, refractory orthostatic hypotension, CHF with reduced EF, chronic nausea and vomiting initially presented to [facility] ED with acute shortness of breath chest pain.  Initial evaluation showed acute CHF complicated by atrial fibrillation with RVR.  Initially hypotensive resuscitated with fluid and started on amiodarone drip.  Patient spiked a fever therefore started on broad-spectrum antibiotics eventually blood cultures grew MRSA and respiratory viral panel was positive for rhinovirus.  On 7/14 patient also suffered from acute right-sided weakness with aphasia.  CT angio showed acute left occlusion ICA.  Neurology and vascular were also consulted and patient was transferred to [facility] for neuroIR eval.  Patient underwent left carotid stent placement and post procedure transferred to the ICU.  01/03/2023: Patient seen.  No significant history from patient.  Available records reviewed.  No new complaints today.  Mild improvement in renal function noted.  Serum creatinine has improved from 1.64-1.39.  Phosphorus is 2.4.  Blood pressure is 151/71 mmHg, will titrate the midodrine downwards to 5 Mg p.o. 3 times daily.  Continue to manage expectantly.   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  Neurology and IR planning to continue Eliquis, Brilinta.  Continue statin.  PT/OT-SNF   MRSA bacteremia, source from cardiac device Septic shock, resolved Repeat blood cultures still remain positive for MRSA bacteremia.  ID is following, patient remains on IV vancomycin.  CT scan does not show any evidence of  cervical/thoracic discitis.  He does have an ICD in place with concerns of vegetation.  Currently no plans for extraction but likely will need TEE Per EP cardiology, not a candidate for extraction due to high risk 01/03/2023: Continue IV vancomycin.   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  Due to soft blood pressure, holding off on introducing any aggressive diuretics at this time.  Continue Lopressor 50 mg twice daily.  Also on midodrine 30 mg every 8 hours   Atrial fibrillation with RVR Rate is still elevated, on metoprolol 50 mg twice daily.  IV as needed. Eliquis BID Consult cardiology We may need to restart amiodarone drip 01/03/2023: Heart rate is currently controlled.  Patient is on IV vancomycin and Eliquis.  Dysphagia with severe nutritional deficiency - Cortrak in place, getting tube feeds.  Monitor for refeeding syndrome.  As we approach goal rate and if patient would like p.o. intake, we can slowly transition to nocturnal tube feeds 01/03/2023: Continue NG tube feeds.  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  No evidence of discitis seen on CT.  CK normal.  Pain control, physical therapy.  Voltaren gel   Acute on chronic CKD stage IIIb NAGMA Hyponatremia, improving Baseline creatinine around 1.5, admission creatinine 2.12.  Slowly improving at this time.  Monitor urine output. 01/03/2023: Improvement in serum creatinine noted (1.64-1.39).   Normocytic anemia, likely anemia of chronic disease Hemoglobin stable at 8.3   Thrombocytopenia Platelets had drifted down to 92 now slowly recovering    GERD -PPI  Chronic hypotension On midodrine 30 mg 3 times daily, Mestinon   Depression/anxiety -Continue home sertraline 25 mg daily    Acute metabolic encephalopathy secondary to  sepsis, improved Improved      DVT prophylaxis: Eliquis Code Status: Full code Family Communication:   Chronically ill patient with MRSA bacteremia and multiple ongoing issues.  Will be in the hospital for least next 3-5 days       Diet Orders (From admission, onward)     Start     Ordered   12/30/22 1025  Diet regular Room service appropriate? Yes with Assist; Fluid consistency: Thin  Diet effective now       Comments: Meds whole with pudding  Question Answer Comment  Room service appropriate? Yes with Assist   Fluid consistency: Thin      12/30/22 1024            Subjective: Seen at bedside, very poor oral intake.  Denies any complaints.   Examination: Constitutional: Chronically ill looking.  Not in acute distress.  Cortrak in place Respiratory: Decreased air entry. Cardiovascular: S1-S2.   Abdomen: Soft and nontender. Extremities: No leg edema.  Swelling of right upper extremity. Neurologic: Patient is awake.  Right-sided weakness, worse right upper extremity.   Objective: Vitals:   01/03/23 0459 01/03/23 0600 01/03/23 0800 01/03/23 0852  BP:  (!) 150/68 (!) 151/71 (!) 151/71  Pulse:  67 87 77  Resp:  17 (!) 21   Temp:   98.8 F (37.1 C)   TempSrc:   Oral   SpO2:  97% 99%   Weight: 71.4 kg       Intake/Output Summary (Last 24 hours) at 01/03/2023 0925 Last data filed at 01/03/2023 0459 Gross per 24 hour  Intake 657.83 ml  Output 1050 ml  Net -392.17 ml   Filed Weights   01/01/23 0500 01/02/23 0453 01/03/23 0459  Weight: 73 kg 73.5 kg 71.4 kg    Scheduled Meds:  apixaban  5 mg Oral BID   atorvastatin  80 mg Oral QHS   Chlorhexidine Gluconate Cloth  6 each Topical Q0600   diclofenac Sodium  4 g Topical QID   feeding supplement  237 mL Oral BID BM   feeding supplement (PROSource TF20)  60 mL Per Tube Daily   gabapentin  100 mg Oral QHS   metoprolol tartrate  75 mg Oral BID   midodrine  30 mg Oral Q8H   multivitamin  1 tablet Oral QHS    mupirocin ointment  1 Application Nasal BID   pantoprazole  40 mg Oral BID   pyridostigmine  60 mg Oral Q8H   sertraline  25 mg Oral Daily   sucralfate  1 g Oral TID WC & HS   thiamine  100 mg Per Tube Daily   ticagrelor  90 mg Oral BID   Or   ticagrelor  90 mg Per Tube BID   Continuous Infusions:  feeding supplement (OSMOLITE 1.5 CAL) Stopped (01/02/23 1751)   vancomycin 1,000 mg (01/02/23 2222)    Nutritional status Signs/Symptoms: severe fat depletion, severe muscle depletion Interventions: Tube feeding, Ensure Enlive (each supplement provides 350kcal and 20 grams of protein), MVI, Refer to RD note for recommendations Body mass index is 23.93 kg/m.  Data Reviewed:   CBC: Recent Labs  Lab 12/30/22 0322 12/31/22 0519 01/01/23 0113 01/02/23 0643 01/03/23 0450  WBC 9.0 9.4 11.3* 11.8* 11.4*  HGB 7.4* 7.3* 8.1* 8.3* 8.7*  HCT 22.7* 23.2* 26.7* 27.1* 29.1*  MCV 92.3 91.7 96.7 93.1 93.6  PLT 92*  89* 101* 120* 167 207   Basic Metabolic Panel: Recent Labs  Lab 12/29/22 2233 12/30/22 0015 12/30/22 0322 12/31/22 0519 01/01/23 0113 01/01/23 1734 01/02/23 0643 01/02/23 1734  NA 137 136 132* 134* 135  --  135  --   K 3.7 3.5 3.8 3.7 4.5  --  4.1  --   CL 105  --  106 109 111  --  110  --   CO2 20*  --  18* 18* 16*  --  19*  --   GLUCOSE 91  --  121* 81 79  --  117*  --   BUN 33*  --  31* 24* 22  --  23  --   CREATININE 1.86*  --  1.73* 1.75* 1.63*  --  1.64*  --   CALCIUM 8.1*  --  7.9* 8.4* 8.6*  --  8.6*  --   MG 1.6*  --  2.1 2.1 2.1 2.0 2.0 1.8  PHOS 3.0  --  2.9  --   --  2.6 2.3* 2.1*   GFR: Estimated Creatinine Clearance: 34.8 mL/min (A) (by C-G formula based on SCr of 1.64 mg/dL (H)). Liver Function Tests: Recent Labs  Lab 12/27/22 1216 12/28/22 0409 12/29/22 0836  AST 25 22 28   ALT 25 21 20   ALKPHOS 81 66 53  BILITOT 0.6 0.9 0.9  PROT 6.2* 5.7* 5.1*  ALBUMIN 3.5 3.3* 2.8*   No results for input(s): "LIPASE", "AMYLASE" in the last 168  hours. No results for input(s): "AMMONIA" in the last 168 hours. Coagulation Profile: Recent Labs  Lab 12/30/22 0322  INR 1.4*   Cardiac Enzymes: Recent Labs  Lab 12/31/22 0924  CKTOTAL 92   BNP (last 3 results) No results for input(s): "PROBNP" in the last 8760 hours. HbA1C: No results for input(s): "HGBA1C" in the last 72 hours. CBG: Recent Labs  Lab 01/02/23 1554 01/02/23 2003 01/02/23 2321 01/03/23 0314 01/03/23 0800  GLUCAP 144* 113* 111* 99 90   Lipid Profile: No results for input(s): "CHOL", "HDL", "LDLCALC", "TRIG", "CHOLHDL", "LDLDIRECT" in the last 72 hours. Thyroid Function Tests: No results for input(s): "TSH", "T4TOTAL", "FREET4", "T3FREE", "THYROIDAB" in the last 72 hours. Anemia Panel: No results for input(s): "VITAMINB12", "FOLATE", "FERRITIN", "TIBC", "IRON", "RETICCTPCT" in the last 72 hours.  Sepsis Labs: Recent Labs  Lab 12/28/22 1340 12/29/22 0836  PROCALCITON  --  5.58  LATICACIDVEN 1.6  --     Recent Results (from the past 240 hour(s))  MRSA Next Gen by PCR, Nasal     Status: Abnormal   Collection Time: 12/27/22  9:38 PM   Specimen: Nasal Mucosa; Nasal Swab  Result Value Ref Range Status   MRSA by PCR Next Gen DETECTED (A) NOT DETECTED Final    Comment: CRITICAL RESULT CALLED TO, READ BACK BY AND VERIFIED WITH: MELISSA COBB RN @ [phone] 12/28/22 [facility] (NOTE) The GeneXpert MRSA Assay (FDA approved for NASAL specimens only), is one component of a comprehensive MRSA colonization surveillance program. It is not intended to diagnose MRSA infection nor to guide or monitor treatment for MRSA infections. Test performance is not FDA approved in patients less than 22 years old. Performed at [facility], [address] [place], [place] [address]   Culture, blood (Routine X 2) w Reflex to ID Panel     Status: Abnormal   Collection Time: 12/28/22 10:47 AM   Specimen: BLOOD RIGHT HAND  Result  Value Ref Range Status   Specimen Description    Final    BLOOD RIGHT HAND Performed at [facility], [address] [place], [place] [address]    Special Requests   Final    BOTTLES DRAWN AEROBIC AND ANAEROBIC Blood Culture adequate volume Performed at [facility], [address] [place], [place] [address]    Culture  Setup Time   Final    GRAM POSITIVE COCCI IN BOTH AEROBIC AND ANAEROBIC BOTTLES CRITICAL RESULT CALLED TO, READ BACK BY AND VERIFIED WITH: Gloriann Loan @ 2146 12/28/22 LFD Performed at [facility], [address] [place], [place] [address]    Culture (A)  Final    STAPHYLOCOCCUS AUREUS SUSCEPTIBILITIES PERFORMED ON PREVIOUS CULTURE WITHIN THE LAST 5 DAYS. Performed at [facility], [address], [place], [place] [address]    Report Status 12/31/2022 FINAL  Final  Culture, blood (Routine X 2) w Reflex to ID Panel     Status: Abnormal   Collection Time: 12/28/22 10:48 AM   Specimen: BLOOD LEFT HAND  Result Value Ref Range Status   Specimen Description   Final    BLOOD LEFT HAND Performed at [facility], [address], [place], [place] [address]    Special Requests   Final    BOTTLES DRAWN AEROBIC AND ANAEROBIC Blood Culture adequate volume Performed at [facility], [address] [place], [place] [address]    Culture  Setup Time   Final    GRAM POSITIVE COCCI IN BOTH AEROBIC AND ANAEROBIC BOTTLES Organism ID to follow CRITICAL RESULT CALLED TO, READ BACK BY AND VERIFIED WITH: TREY GREENWOOD @2146  12/28/22 LFD Performed at [facility], [address] [place], [place] [address]    Culture METHICILLIN RESISTANT STAPHYLOCOCCUS AUREUS (A)  Final   Report Status 12/31/2022 FINAL  Final   Organism ID, Bacteria METHICILLIN RESISTANT STAPHYLOCOCCUS AUREUS  Final      Susceptibility   Methicillin resistant staphylococcus aureus - MIC*    CIPROFLOXACIN >=8 RESISTANT Resistant     ERYTHROMYCIN >=8 RESISTANT Resistant     GENTAMICIN  <=0.5 SENSITIVE Sensitive     OXACILLIN >=4 RESISTANT Resistant     TETRACYCLINE <=1 SENSITIVE Sensitive     VANCOMYCIN <=0.5 SENSITIVE Sensitive     TRIMETH/SULFA >=320 RESISTANT Resistant     CLINDAMYCIN <=0.25 SENSITIVE Sensitive     RIFAMPIN <=0.5 SENSITIVE Sensitive     Inducible Clindamycin NEGATIVE Sensitive     LINEZOLID 2 SENSITIVE Sensitive     * METHICILLIN RESISTANT STAPHYLOCOCCUS AUREUS  Blood Culture ID Panel (Reflexed)     Status: Abnormal   Collection Time: 12/28/22 10:48 AM  Result Value Ref Range Status   Enterococcus faecalis NOT DETECTED NOT DETECTED Final   Enterococcus Faecium NOT DETECTED NOT DETECTED Final   Listeria monocytogenes NOT DETECTED NOT DETECTED Final   Staphylococcus species DETECTED (A) NOT DETECTED Final    Comment: CRITICAL RESULT CALLED TO, READ BACK BY AND VERIFIED WITH: TREY GREENWOOD @ 2146 12/28/22 LFD    Staphylococcus aureus (BCID) DETECTED (A) NOT DETECTED Final    Comment: Methicillin (oxacillin)-resistant Staphylococcus aureus (MRSA). MRSA is predictably resistant to beta-lactam antibiotics (except ceftaroline). Preferred therapy is vancomycin unless clinically contraindicated. Patient requires contact precautions if  hospitalized. CRITICAL RESULT CALLED TO, READ BACK BY AND VERIFIED WITH: TREY GREENWOOD @ 2146 12/28/22 LFD    Staphylococcus epidermidis NOT DETECTED NOT DETECTED Final   Staphylococcus lugdunensis NOT DETECTED NOT  DETECTED Final   Streptococcus species NOT DETECTED NOT DETECTED Final   Streptococcus agalactiae NOT DETECTED NOT DETECTED Final   Streptococcus pneumoniae NOT DETECTED NOT DETECTED Final   Streptococcus pyogenes NOT DETECTED NOT DETECTED Final   A.calcoaceticus-baumannii NOT DETECTED NOT DETECTED Final   Bacteroides fragilis NOT DETECTED NOT DETECTED Final   Enterobacterales NOT DETECTED NOT DETECTED Final   Enterobacter cloacae complex NOT DETECTED NOT DETECTED Final   Escherichia coli NOT DETECTED NOT  DETECTED Final   Klebsiella aerogenes NOT DETECTED NOT DETECTED Final   Klebsiella oxytoca NOT DETECTED NOT DETECTED Final   Klebsiella pneumoniae NOT DETECTED NOT DETECTED Final   Proteus species NOT DETECTED NOT DETECTED Final   Salmonella species NOT DETECTED NOT DETECTED Final   Serratia marcescens NOT DETECTED NOT DETECTED Final   Haemophilus influenzae NOT DETECTED NOT DETECTED Final   Neisseria meningitidis NOT DETECTED NOT DETECTED Final   Pseudomonas aeruginosa NOT DETECTED NOT DETECTED Final   Stenotrophomonas maltophilia NOT DETECTED NOT DETECTED Final   Candida albicans NOT DETECTED NOT DETECTED Final   Candida auris NOT DETECTED NOT DETECTED Final   Candida glabrata NOT DETECTED NOT DETECTED Final   Candida krusei NOT DETECTED NOT DETECTED Final   Candida parapsilosis NOT DETECTED NOT DETECTED Final   Candida tropicalis NOT DETECTED NOT DETECTED Final   Cryptococcus neoformans/gattii NOT DETECTED NOT DETECTED Final   Meth resistant mecA/C and MREJ DETECTED (A) NOT DETECTED Final    Comment: CRITICAL RESULT CALLED TO, READ BACK BY AND VERIFIED WITH: TREY GREENWOOD @ 2146 12/28/22 LFD Performed at [facility], [address] [place], [place] [address]   Respiratory (~20 pathogens) panel by PCR     Status: None   Collection Time: 12/28/22 11:55 AM   Specimen: Nasopharyngeal Swab; Respiratory  Result Value Ref Range Status   Adenovirus NOT DETECTED NOT DETECTED Final   Coronavirus 229E NOT DETECTED NOT DETECTED Final    Comment: (NOTE) The Coronavirus on the Respiratory Panel, DOES NOT test for the novel  Coronavirus (2019 nCoV)    Coronavirus HKU1 NOT DETECTED NOT DETECTED Final   Coronavirus NL63 NOT DETECTED NOT DETECTED Final   Coronavirus OC43 NOT DETECTED NOT DETECTED Final   Metapneumovirus NOT DETECTED NOT DETECTED Final   Rhinovirus / Enterovirus NOT DETECTED NOT DETECTED Final   Influenza A NOT DETECTED NOT DETECTED Final   Influenza B NOT  DETECTED NOT DETECTED Final   Parainfluenza Virus 1 NOT DETECTED NOT DETECTED Final   Parainfluenza Virus 2 NOT DETECTED NOT DETECTED Final   Parainfluenza Virus 3 NOT DETECTED NOT DETECTED Final   Parainfluenza Virus 4 NOT DETECTED NOT DETECTED Final   Respiratory Syncytial Virus NOT DETECTED NOT DETECTED Final   Bordetella pertussis NOT DETECTED NOT DETECTED Final   Bordetella Parapertussis NOT DETECTED NOT DETECTED Final   Chlamydophila pneumoniae NOT DETECTED NOT DETECTED Final   Mycoplasma pneumoniae NOT DETECTED NOT DETECTED Final    Comment: Performed at [facility], [address] [place], [place] [address]  SARS Coronavirus 2 by RT PCR (hospital order, performed in [facility] hospital lab) *cepheid single result test* Anterior Nasal Swab     Status: None   Collection Time: 12/29/22  1:06 PM   Specimen: Anterior Nasal Swab  Result Value Ref Range Status   SARS Coronavirus 2 by RT PCR NEGATIVE NEGATIVE Final    Comment: Performed at [facility], [address], [place], [place] [address]  MRSA Next Gen by  PCR, Nasal     Status: Abnormal   Collection Time: 12/29/22  6:50 PM   Specimen: Nasal Mucosa; Nasal Swab  Result Value Ref Range Status   MRSA by PCR Next Gen DETECTED (A) NOT DETECTED Final    Comment: RESULT CALLED TO, READ BACK BY AND VERIFIED WITH: S. SAVAGE  RN 12/29/22 @ 2126 BY AB (NOTE) The GeneXpert MRSA Assay (FDA approved for NASAL specimens only), is one component of a comprehensive MRSA colonization surveillance program. It is not intended to diagnose MRSA infection nor to guide or monitor treatment for MRSA infections. Test performance is not FDA approved in patients less than 67 years old. Performed at [facility], [address] [place], [place] [address]   Culture, blood (Routine X 2) w Reflex to ID Panel     Status: Abnormal   Collection Time: 12/31/22  7:37 AM   Specimen: BLOOD RIGHT HAND  Result Value Ref Range Status   Specimen  Description BLOOD RIGHT HAND  Final   Special Requests   Final    BOTTLES DRAWN AEROBIC AND ANAEROBIC Blood Culture adequate volume   Culture  Setup Time   Final    GRAM POSITIVE COCCI IN CLUSTERS ANAEROBIC BOTTLE ONLY CRITICAL RESULT CALLED TO, READ BACK BY AND VERIFIED WITH: PHARMD J. LEDFORD 01/01/23 @ 0330 BY AB Performed at [facility], [address], [place], [place] [address]    Culture METHICILLIN RESISTANT STAPHYLOCOCCUS AUREUS (A)  Final   Report Status 01/03/2023 FINAL  Final   Organism ID, Bacteria METHICILLIN RESISTANT STAPHYLOCOCCUS AUREUS  Final      Susceptibility   Methicillin resistant staphylococcus aureus - MIC*    CIPROFLOXACIN >=8 RESISTANT Resistant     ERYTHROMYCIN >=8 RESISTANT Resistant     GENTAMICIN <=0.5 SENSITIVE Sensitive     OXACILLIN >=4 RESISTANT Resistant     TETRACYCLINE <=1 SENSITIVE Sensitive     VANCOMYCIN 1 SENSITIVE Sensitive     TRIMETH/SULFA >=320 RESISTANT Resistant     CLINDAMYCIN <=0.25 SENSITIVE Sensitive     RIFAMPIN <=0.5 SENSITIVE Sensitive     Inducible Clindamycin NEGATIVE Sensitive     LINEZOLID 2 SENSITIVE Sensitive     * METHICILLIN RESISTANT STAPHYLOCOCCUS AUREUS  Blood Culture ID Panel (Reflexed)     Status: Abnormal   Collection Time: 12/31/22  7:37 AM  Result Value Ref Range Status   Enterococcus faecalis NOT DETECTED NOT DETECTED Final   Enterococcus Faecium NOT DETECTED NOT DETECTED Final   Listeria monocytogenes NOT DETECTED NOT DETECTED Final   Staphylococcus species DETECTED (A) NOT DETECTED Final    Comment: CRITICAL RESULT CALLED TO, READ BACK BY AND VERIFIED WITH: PHARMD J. LEDFORD 01/01/23 @ 0330 BY AB    Staphylococcus aureus (BCID) DETECTED (A) NOT DETECTED Final    Comment: Methicillin (oxacillin)-resistant Staphylococcus aureus (MRSA). MRSA is predictably resistant to beta-lactam antibiotics (except ceftaroline). Preferred therapy is vancomycin unless clinically contraindicated. Patient requires  contact precautions if  hospitalized. CRITICAL RESULT CALLED TO, READ BACK BY AND VERIFIED WITH: PHARMD J. LEDFORD 01/01/23 @ 0330 BY AB    Staphylococcus epidermidis NOT DETECTED NOT DETECTED Final   Staphylococcus lugdunensis NOT DETECTED NOT DETECTED Final   Streptococcus species NOT DETECTED NOT DETECTED Final   Streptococcus agalactiae NOT DETECTED NOT DETECTED Final   Streptococcus pneumoniae NOT DETECTED NOT DETECTED Final   Streptococcus pyogenes NOT DETECTED NOT DETECTED Final   A.calcoaceticus-baumannii NOT DETECTED NOT DETECTED Final   Bacteroides fragilis NOT DETECTED  NOT DETECTED Final   Enterobacterales NOT DETECTED NOT DETECTED Final   Enterobacter cloacae complex NOT DETECTED NOT DETECTED Final   Escherichia coli NOT DETECTED NOT DETECTED Final   Klebsiella aerogenes NOT DETECTED NOT DETECTED Final   Klebsiella oxytoca NOT DETECTED NOT DETECTED Final   Klebsiella pneumoniae NOT DETECTED NOT DETECTED Final   Proteus species NOT DETECTED NOT DETECTED Final   Salmonella species NOT DETECTED NOT DETECTED Final   Serratia marcescens NOT DETECTED NOT DETECTED Final   Haemophilus influenzae NOT DETECTED NOT DETECTED Final   Neisseria meningitidis NOT DETECTED NOT DETECTED Final   Pseudomonas aeruginosa NOT DETECTED NOT DETECTED Final   Stenotrophomonas maltophilia NOT DETECTED NOT DETECTED Final   Candida albicans NOT DETECTED NOT DETECTED Final   Candida auris NOT DETECTED NOT DETECTED Final   Candida glabrata NOT DETECTED NOT DETECTED Final   Candida krusei NOT DETECTED NOT DETECTED Final   Candida parapsilosis NOT DETECTED NOT DETECTED Final   Candida tropicalis NOT DETECTED NOT DETECTED Final   Cryptococcus neoformans/gattii NOT DETECTED NOT DETECTED Final   Meth resistant mecA/C and MREJ DETECTED (A) NOT DETECTED Final    Comment: CRITICAL RESULT CALLED TO, READ BACK BY AND VERIFIED WITH: PHARMD J. LEDFORD 01/01/23 @ 0330 BY AB Performed at [facility],  [address], [place], [place] [address]   Culture, blood (Routine X 2) w Reflex to ID Panel     Status: None (Preliminary result)   Collection Time: 12/31/22  7:43 AM   Specimen: BLOOD RIGHT HAND  Result Value Ref Range Status   Specimen Description BLOOD RIGHT HAND  Final   Special Requests   Final    BOTTLES DRAWN AEROBIC ONLY Blood Culture adequate volume   Culture   Final    NO GROWTH 3 DAYS Performed at [facility], [address], [place], [place] [address]    Report Status PENDING  Incomplete  Culture, blood (Routine X 2) w Reflex to ID Panel     Status: None (Preliminary result)   Collection Time: 01/02/23  3:51 PM   Specimen: BLOOD LEFT HAND  Result Value Ref Range Status   Specimen Description BLOOD LEFT HAND  Final   Special Requests   Final    BOTTLES DRAWN AEROBIC AND ANAEROBIC Blood Culture adequate volume   Culture   Final    NO GROWTH < 24 HOURS Performed at [facility], [address], [place], [place] [address]    Report Status PENDING  Incomplete  Culture, blood (Routine X 2) w Reflex to ID Panel     Status: None (Preliminary result)   Collection Time: 01/02/23  3:51 PM   Specimen: BLOOD RIGHT HAND  Result Value Ref Range Status   Specimen Description BLOOD RIGHT HAND  Final   Special Requests   Final    BOTTLES DRAWN AEROBIC ONLY Blood Culture results may not be optimal due to an inadequate volume of blood received in culture bottles   Culture   Final    NO GROWTH < 24 HOURS Performed at [facility], [address] [place], [place] [address]    Report Status PENDING  Incomplete         Radiology Studies: DG Abd Portable 1V  Result Date: 01/01/2023 CLINICAL DATA:  Feeding tube placement EXAM: PORTABLE ABDOMEN - 1 VIEW COMPARISON:  04/30/2018 FINDINGS: AICD leads noted. A feeding tube is present with tip in the stomach antrum. Indistinct left hemidiaphragm,  cannot exclude mild left basilar atelectasis. IMPRESSION: 1. Feeding tube  tip in the stomach antrum. Electronically Signed   By: Gaylyn Rong M.D.   On: 01/01/2023 16:31   DG CHEST PORT 1 VIEW  Result Date: 01/01/2023 CLINICAL DATA:  Possible density in right upper lobe seen in CT thoracic spine done on 12/31/2022 EXAM: PORTABLE CHEST 1 VIEW COMPARISON:  Previous studies including this CT thoracic spine done on 12/31/2022 FINDINGS: Transverse diameter of heart is increased. Thoracic aorta is tortuous and ectatic. Pacemaker/defibrillator battery is seen in the left infraclavicular region. Small faint patchy density is seen in right parahilar region. There is interval appearance of moderate sized area of increased markings in left lower lung field. Left lateral CP angle is indistinct. There is no pneumothorax. IMPRESSION: Small faint patchy density in right parahilar region may suggest subsegmental atelectasis. There is moderate sized new infiltrate in left lower lung field suggesting atelectasis/pneumonia. Small left pleural effusion. Electronically Signed   By: Ernie Avena M.D.   On: 01/01/2023 13:51           LOS: 5 days   Time spent= 55 mins    Barnetta Chapel, MD [facility]  If 7PM-7AM, please contact night-coverage  01/03/2023, 9:25 AM \

## 2023-01-03 NOTE — Progress Notes (Signed)
SLP Cancellation Note  Patient Details Name: Matthew Zamora MRN: [id number] DOB: June 16, 1941   Cancelled treatment:       Reason Eval/Treat Not Completed: Patient at procedure - having cortrak placed. SLP following for cognition only. Swallow is WNL.  Manoj Enriquez L. Samson Frederic, MA CCC/SLP Clinical Specialist - Acute Care SLP Acute Rehabilitation Services Office number [phone]    Blenda Mounts Laurice 01/03/2023, 10:54 AM

## 2023-01-03 NOTE — Progress Notes (Signed)
Palliative Medicine Inpatient Follow Up Note HPI: 82 year old man with myriad of comorobidities including refractory orthostatic hypotension, HFrEF, chronic N/V who presented w/ SOB at [facility] acteremic with MRSA as well has + for rhinovirus. (+( R sided weakness w/ L ICA thrombus transferred to [facility] for emergent thrombectomy.    Palliative care has been asked to get involved in the setting of multiple readmissions, high chronic disease burden, and declined health to further discuss goals of care.   Today's Discussion 01/03/2023  *Please note that this is a verbal dictation therefore any spelling or grammatical errors are due to the "[employer]" system interpretation.  Chart reviewed inclusive of vital signs, progress notes, laboratory results, and diagnostic images.   I met with Matthew Zamora at bedside this morning.  He was aware of self though not situation.  ___________________ Addendum:  I met with patients RN, Matthew Zamora who shares patient keeps losing IV access. Not a candidate for PICC until bacteremia is treated.   I cam back this afternoon and spoke with Matthew Zamora and his son, Matthew Zamora. Matthew Zamora is complaining of neck pain at this time though appears more oriented.   Matthew Zamora shares that Matthew Zamora has had little to no appetite. He did bring foods in an effort to entice him.  I spoke to Matthew Zamora and I asked him what he would like at this time. He is clear about his desire to improve. We reviewed that he has to move and eat in order to improve. I shared that if unable to do these things he is looking at limited time on earth.  Matthew Zamora and I  further discussed that [name] has come out of difficult situation in the past. I shared my concerns in regards to his situation here and now as his mental state is fluctuant and he does not exemplify a strong desire to move.   Presently the goals remain for full code full scope of care as confirmed with patient and his son.  Today we will work on more  comprehensive pain control.   Questions and concerns addressed/Palliative Support Provided.   Objective Assessment: Vital Signs Vitals:   01/03/23 0800 01/03/23 0852  BP: (!) 151/71 (!) 151/71  Pulse: 87 77  Resp: (!) 21   Temp: 98.8 F (37.1 C)   SpO2: 99%     Intake/Output Summary (Last 24 hours) at 01/03/2023 1500 Last data filed at 01/03/2023 1355 Gross per 24 hour  Intake 657.83 ml  Output 1450 ml  Net -792.17 ml   Last Weight  Most recent update: 01/03/2023  5:14 AM    Weight  71.4 kg (157 lb 6.5 oz)            Gen:  Elderly Caucasian M chronically ill appearing HEENT: moist mucous membranes CV: Irregular rate and rhythm  PULM:  On 2LPM [place], breathing is even and nonlabored ABD: soft/nontender  EXT: No edema  Neuro: Aware of who he is and where he is though not understanding why  SUMMARY OF RECOMMENDATIONS   Full Code / Full Scope of Care    Allow time for outcomes   PT/OT/Speech    Ongoing PMT support  Symptoms:  Neck Pain: - Tylenol 1g TID - Oxycodone 5mg  Q6H PRN  Delirium: -Lights and TV off, minimize interruptions at night -Blinds open and lights on during day -Glasses/hearing aid with patient -Get up during the day -Encourage a familiar face to remain present throughout the day -Frequent reorientation  Billing based on MDM:  High ______________________________________________________________________________________ Matthew Zamora [place] Palliative Medicine Team Team Cell Phone: [phone] Please utilize secure chat with additional questions, if there is no response within 30 minutes please call the above phone number  Palliative Medicine Team providers are available by phone from 7am to 7pm daily and can be reached through the team cell phone.  Should this patient require assistance outside of these hours, please call the patient's attending physician.

## 2023-01-03 NOTE — Progress Notes (Signed)
[facility]  Date of Admission:  12/29/2022      Total days of antibiotics 5   Vancomycin 7/13 >> current          ASSESSMENT: Matthew Zamora is a 82 y.o. 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   MRSA Bacteremia -  -Have not been able to clear bacteremia yet unfortunately; likely due to nidus being cardiac device which he is not a candidate to remove. Not a candidate for TEE either at this time  -His blood cultures from 7/16 redraw continue to low MRSA, though a lower burden. Will follow repeats from 7/18 (< 24h no growth so far).  -May consider Daptomycin + Ceftaroline combination therapy, though without resistance of MRSA may not be offering much outside of vancomycin as the source remains.    Cardiac Device infection -  -D/W EP today. Not a candidate for TEE or extraction. 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  -Follow repeat BCx  -Hope would be to sterilize blood then bridge to indefinite attempt at suppressive antibiotics (doxy likely) though he is quite frail and I am not sure he will discharge from the hospital.   Neck Pain -  -CT scan w/o contrast does not reveal any infectious discitis/OM concern, however not as sensitive as MRI study - unable to be obtained d/t his device.  -There is a high degree of concern that this is acutely painful d/t infection that has seeded his spine. He is not currently an operative candidate in any way so I don't think it would be a benefit to repeat study.  -May be more reasonable to offer 8 weeks IV induction > 6 but not sure it matters much with ongoing plan to do suppressive POs, should we make it there.   Medication Monitoring -  -Follow creatinine and Vanc levels closely.     We will follow up remotely over the weekend (Dr. Luciana Axe is available  for questions) and see him back again in person next week.     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, left ([name])    amiodarone  150 mg Intravenous Once   apixaban  5 mg Oral BID   atorvastatin  80 mg Oral QHS   Chlorhexidine Gluconate Cloth  6 each Topical Q0600   diclofenac Sodium  4 g Topical QID   feeding supplement  237 mL Oral BID BM   feeding supplement (PROSource TF20)  60 mL Per Tube Daily   gabapentin  100 mg Oral QHS   metoprolol tartrate  75 mg Oral BID   midodrine  5 mg Oral Q8H   multivitamin  1 tablet Oral QHS   mupirocin ointment  1 Application Nasal BID   pantoprazole  40 mg Oral BID   pyridostigmine  60 mg Oral Q8H   sertraline  25 mg Oral Daily   sucralfate  1 g Oral TID WC & HS   thiamine  100 mg Per Tube Daily   ticagrelor  90 mg Oral BID   Or   ticagrelor  90 mg Per Tube BID    SUBJECTIVE: Still having a lot  of left sided posterior neck pain.   Review of Systems: Review of Systems  Constitutional:  Negative for chills and fever.  Musculoskeletal:  Positive for neck pain.    Allergies  Allergen Reactions   Nitrofuran Derivatives Other (See Comments)    Transaminitis ** confounded w/Amiodarone, Mexiletine   Amiodarone Other (See Comments)    Unknown    Spironolactone Other (See Comments)    Significant transaminitis     OBJECTIVE: Vitals:   01/03/23 0459 01/03/23 0600 01/03/23 0800 01/03/23 0852  BP:  (!) 150/68 (!) 151/71 (!) 151/71  Pulse:  67 87 77  Resp:  17 (!) 21   Temp:   98.8 F (37.1 C)   TempSrc:   Oral   SpO2:  97% 99%   Weight: 71.4 kg      Body mass index is 23.93 kg/m.  Physical Exam Vitals reviewed.  Constitutional:      Appearance: He is ill-appearing.  Neck:     Comments: Challenges with left lateral neck  movements in particular, favors the right. Ongoing pain to the posterior vertebrae w/o any visual defect noted.  Cardiovascular:     Rate and Rhythm: Normal rate and regular rhythm.  Skin:    Capillary Refill: Capillary refill takes less than 2 seconds.     Findings: Bruising and erythema present.     Comments: Multiple bruised areas on bilateral arms, swelling.   Neurological:     Mental Status: He is disoriented.     Lab Results Lab Results  Component Value Date   WBC 11.4 (H) 01/03/2023   HGB 8.7 (L) 01/03/2023   HCT 29.1 (L) 01/03/2023   MCV 93.6 01/03/2023   PLT 207 01/03/2023    Lab Results  Component Value Date   CREATININE 1.39 (H) 01/03/2023   BUN 26 (H) 01/03/2023   NA 138 01/03/2023   K 4.4 01/03/2023   CL 110 01/03/2023   CO2 20 (L) 01/03/2023    Lab Results  Component Value Date   ALT 20 12/29/2022   AST 28 12/29/2022   ALKPHOS 53 12/29/2022   BILITOT 0.9 12/29/2022     Microbiology: Recent Results (from the past 240 hour(s))  MRSA Next Gen by PCR, Nasal     Status: Abnormal   Collection Time: 12/27/22  9:38 PM   Specimen: Nasal Mucosa; Nasal Swab  Result Value Ref Range Status   MRSA by PCR Next Gen DETECTED (A) NOT DETECTED Final    Comment: CRITICAL RESULT CALLED TO, READ BACK BY AND VERIFIED WITH: MELISSA COBB RN @ [phone] 12/28/22 [facility] (NOTE) The GeneXpert MRSA Assay (FDA approved for NASAL specimens only), is one component of a comprehensive MRSA colonization surveillance program. It is not intended to diagnose MRSA infection nor to guide or monitor treatment for MRSA infections. Test performance is not FDA approved in patients less than 27 years old. Performed at [facility], [address] [place], [place] [address]   Culture, blood (Routine X 2) w Reflex to ID Panel     Status: Abnormal   Collection Time: 12/28/22 10:47 AM   Specimen: BLOOD RIGHT HAND  Result Value Ref Range Status   Specimen Description   Final    BLOOD RIGHT  HAND Performed at [facility], [address], [place], [place] [address]    Special Requests   Final    BOTTLES DRAWN AEROBIC AND ANAEROBIC Blood Culture adequate volume Performed at [facility], [address] [place],  [place] [address]    Culture  Setup Time   Final    GRAM POSITIVE COCCI IN BOTH AEROBIC AND ANAEROBIC BOTTLES CRITICAL RESULT CALLED TO, READ BACK BY AND VERIFIED WITH: TREY GREENWOOD @ 2146 12/28/22 LFD Performed at [facility], [address] [place], [place] [address]    Culture (A)  Final    STAPHYLOCOCCUS AUREUS SUSCEPTIBILITIES PERFORMED ON PREVIOUS CULTURE WITHIN THE LAST 5 DAYS. Performed at [facility], [address] [place], [place] [address]    Report Status 12/31/2022 FINAL  Final  Culture, blood (Routine X 2) w Reflex to ID Panel     Status: Abnormal   Collection Time: 12/28/22 10:48 AM   Specimen: BLOOD LEFT HAND  Result Value Ref Range Status   Specimen Description   Final    BLOOD LEFT HAND Performed at [facility], [address] [place], [place] [address]    Special Requests   Final    BOTTLES DRAWN AEROBIC AND ANAEROBIC Blood Culture adequate volume Performed at [facility], [address] [place], [place] [address]    Culture  Setup Time   Final    GRAM POSITIVE COCCI IN BOTH AEROBIC AND ANAEROBIC BOTTLES Organism ID to follow CRITICAL RESULT CALLED TO, READ BACK BY AND VERIFIED WITH: TREY GREENWOOD @2146  12/28/22 LFD Performed at [facility], [address] [place], [place] [address]    Culture METHICILLIN RESISTANT STAPHYLOCOCCUS AUREUS (A)  Final   Report Status 12/31/2022 FINAL  Final   Organism ID, Bacteria METHICILLIN RESISTANT STAPHYLOCOCCUS AUREUS  Final      Susceptibility   Methicillin resistant staphylococcus aureus - MIC*    CIPROFLOXACIN >=8 RESISTANT Resistant     ERYTHROMYCIN >=8 RESISTANT Resistant     GENTAMICIN <=0.5 SENSITIVE Sensitive      OXACILLIN >=4 RESISTANT Resistant     TETRACYCLINE <=1 SENSITIVE Sensitive     VANCOMYCIN <=0.5 SENSITIVE Sensitive     TRIMETH/SULFA >=320 RESISTANT Resistant     CLINDAMYCIN <=0.25 SENSITIVE Sensitive     RIFAMPIN <=0.5 SENSITIVE Sensitive     Inducible Clindamycin NEGATIVE Sensitive     LINEZOLID 2 SENSITIVE Sensitive     * METHICILLIN RESISTANT STAPHYLOCOCCUS AUREUS  Blood Culture ID Panel (Reflexed)     Status: Abnormal   Collection Time: 12/28/22 10:48 AM  Result Value Ref Range Status   Enterococcus faecalis NOT DETECTED NOT DETECTED Final   Enterococcus Faecium NOT DETECTED NOT DETECTED Final   Listeria monocytogenes NOT DETECTED NOT DETECTED Final   Staphylococcus species DETECTED (A) NOT DETECTED Final    Comment: CRITICAL RESULT CALLED TO, READ BACK BY AND VERIFIED WITH: TREY GREENWOOD @ 2146 12/28/22 LFD    Staphylococcus aureus (BCID) DETECTED (A) NOT DETECTED Final    Comment: Methicillin (oxacillin)-resistant Staphylococcus aureus (MRSA). MRSA is predictably resistant to beta-lactam antibiotics (except ceftaroline). Preferred therapy is vancomycin unless clinically contraindicated. Patient requires contact precautions if  hospitalized. CRITICAL RESULT CALLED TO, READ BACK BY AND VERIFIED WITH: TREY GREENWOOD @ 2146 12/28/22 LFD    Staphylococcus epidermidis NOT DETECTED NOT DETECTED Final   Staphylococcus lugdunensis NOT DETECTED NOT DETECTED Final   Streptococcus species NOT DETECTED NOT DETECTED Final   Streptococcus agalactiae NOT DETECTED NOT DETECTED Final   Streptococcus pneumoniae NOT DETECTED NOT DETECTED Final   Streptococcus pyogenes NOT DETECTED NOT DETECTED Final   A.calcoaceticus-baumannii NOT DETECTED NOT DETECTED Final   Bacteroides fragilis NOT DETECTED NOT DETECTED Final   Enterobacterales NOT DETECTED  NOT DETECTED Final   Enterobacter cloacae complex NOT DETECTED NOT DETECTED Final   Escherichia coli NOT DETECTED NOT DETECTED Final   Klebsiella  aerogenes NOT DETECTED NOT DETECTED Final   Klebsiella oxytoca NOT DETECTED NOT DETECTED Final   Klebsiella pneumoniae NOT DETECTED NOT DETECTED Final   Proteus species NOT DETECTED NOT DETECTED Final   Salmonella species NOT DETECTED NOT DETECTED Final   Serratia marcescens NOT DETECTED NOT DETECTED Final   Haemophilus influenzae NOT DETECTED NOT DETECTED Final   Neisseria meningitidis NOT DETECTED NOT DETECTED Final   Pseudomonas aeruginosa NOT DETECTED NOT DETECTED Final   Stenotrophomonas maltophilia NOT DETECTED NOT DETECTED Final   Candida albicans NOT DETECTED NOT DETECTED Final   Candida auris NOT DETECTED NOT DETECTED Final   Candida glabrata NOT DETECTED NOT DETECTED Final   Candida krusei NOT DETECTED NOT DETECTED Final   Candida parapsilosis NOT DETECTED NOT DETECTED Final   Candida tropicalis NOT DETECTED NOT DETECTED Final   Cryptococcus neoformans/gattii NOT DETECTED NOT DETECTED Final   Meth resistant mecA/C and MREJ DETECTED (A) NOT DETECTED Final    Comment: CRITICAL RESULT CALLED TO, READ BACK BY AND VERIFIED WITH: TREY GREENWOOD @ 2146 12/28/22 LFD Performed at [facility], [address] [place], [place] [address]   Respiratory (~20 pathogens) panel by PCR     Status: None   Collection Time: 12/28/22 11:55 AM   Specimen: Nasopharyngeal Swab; Respiratory  Result Value Ref Range Status   Adenovirus NOT DETECTED NOT DETECTED Final   Coronavirus 229E NOT DETECTED NOT DETECTED Final    Comment: (NOTE) The Coronavirus on the Respiratory Panel, DOES NOT test for the novel  Coronavirus (2019 nCoV)    Coronavirus HKU1 NOT DETECTED NOT DETECTED Final   Coronavirus NL63 NOT DETECTED NOT DETECTED Final   Coronavirus OC43 NOT DETECTED NOT DETECTED Final   Metapneumovirus NOT DETECTED NOT DETECTED Final   Rhinovirus / Enterovirus NOT DETECTED NOT DETECTED Final   Influenza A NOT DETECTED NOT DETECTED Final   Influenza B NOT DETECTED NOT DETECTED Final    Parainfluenza Virus 1 NOT DETECTED NOT DETECTED Final   Parainfluenza Virus 2 NOT DETECTED NOT DETECTED Final   Parainfluenza Virus 3 NOT DETECTED NOT DETECTED Final   Parainfluenza Virus 4 NOT DETECTED NOT DETECTED Final   Respiratory Syncytial Virus NOT DETECTED NOT DETECTED Final   Bordetella pertussis NOT DETECTED NOT DETECTED Final   Bordetella Parapertussis NOT DETECTED NOT DETECTED Final   Chlamydophila pneumoniae NOT DETECTED NOT DETECTED Final   Mycoplasma pneumoniae NOT DETECTED NOT DETECTED Final    Comment: Performed at [facility], [address], [place], [place] [address]  SARS Coronavirus 2 by RT PCR (hospital order, performed in [facility] hospital lab) *cepheid single result test* Anterior Nasal Swab     Status: None   Collection Time: 12/29/22  1:06 PM   Specimen: Anterior Nasal Swab  Result Value Ref Range Status   SARS Coronavirus 2 by RT PCR NEGATIVE NEGATIVE Final    Comment: Performed at [facility], [address], [place], [place] [address]  MRSA Next Gen by PCR, Nasal     Status: Abnormal   Collection Time: 12/29/22  6:50 PM   Specimen: Nasal Mucosa; Nasal Swab  Result Value Ref Range Status   MRSA by PCR Next Gen DETECTED (A) NOT DETECTED Final    Comment: RESULT CALLED TO, READ BACK BY AND VERIFIED WITH: S. SAVAGE  RN 12/29/22 @  2126 BY AB (NOTE) The GeneXpert MRSA Assay (FDA approved for NASAL specimens only), is one component of a comprehensive MRSA colonization surveillance program. It is not intended to diagnose MRSA infection nor to guide or monitor treatment for MRSA infections. Test performance is not FDA approved in patients less than 19 years old. Performed at [facility], [address] [place], [place] [address]   Culture, blood (Routine X 2) w Reflex to ID Panel     Status: Abnormal   Collection Time: 12/31/22  7:37 AM   Specimen: BLOOD RIGHT HAND  Result Value Ref Range Status   Specimen Description BLOOD RIGHT HAND   Final   Special Requests   Final    BOTTLES DRAWN AEROBIC AND ANAEROBIC Blood Culture adequate volume   Culture  Setup Time   Final    GRAM POSITIVE COCCI IN CLUSTERS ANAEROBIC BOTTLE ONLY CRITICAL RESULT CALLED TO, READ BACK BY AND VERIFIED WITH: PHARMD J. LEDFORD 01/01/23 @ 0330 BY AB Performed at [facility], [address] [place], [place] [address]    Culture METHICILLIN RESISTANT STAPHYLOCOCCUS AUREUS (A)  Final   Report Status 01/03/2023 FINAL  Final   Organism ID, Bacteria METHICILLIN RESISTANT STAPHYLOCOCCUS AUREUS  Final      Susceptibility   Methicillin resistant staphylococcus aureus - MIC*    CIPROFLOXACIN >=8 RESISTANT Resistant     ERYTHROMYCIN >=8 RESISTANT Resistant     GENTAMICIN <=0.5 SENSITIVE Sensitive     OXACILLIN >=4 RESISTANT Resistant     TETRACYCLINE <=1 SENSITIVE Sensitive     VANCOMYCIN 1 SENSITIVE Sensitive     TRIMETH/SULFA >=320 RESISTANT Resistant     CLINDAMYCIN <=0.25 SENSITIVE Sensitive     RIFAMPIN <=0.5 SENSITIVE Sensitive     Inducible Clindamycin NEGATIVE Sensitive     LINEZOLID 2 SENSITIVE Sensitive     * METHICILLIN RESISTANT STAPHYLOCOCCUS AUREUS  Blood Culture ID Panel (Reflexed)     Status: Abnormal   Collection Time: 12/31/22  7:37 AM  Result Value Ref Range Status   Enterococcus faecalis NOT DETECTED NOT DETECTED Final   Enterococcus Faecium NOT DETECTED NOT DETECTED Final   Listeria monocytogenes NOT DETECTED NOT DETECTED Final   Staphylococcus species DETECTED (A) NOT DETECTED Final    Comment: CRITICAL RESULT CALLED TO, READ BACK BY AND VERIFIED WITH: PHARMD J. LEDFORD 01/01/23 @ 0330 BY AB    Staphylococcus aureus (BCID) DETECTED (A) NOT DETECTED Final    Comment: Methicillin (oxacillin)-resistant Staphylococcus aureus (MRSA). MRSA is predictably resistant to beta-lactam antibiotics (except ceftaroline). Preferred therapy is vancomycin unless clinically contraindicated. Patient requires contact precautions if   hospitalized. CRITICAL RESULT CALLED TO, READ BACK BY AND VERIFIED WITH: PHARMD J. LEDFORD 01/01/23 @ 0330 BY AB    Staphylococcus epidermidis NOT DETECTED NOT DETECTED Final   Staphylococcus lugdunensis NOT DETECTED NOT DETECTED Final   Streptococcus species NOT DETECTED NOT DETECTED Final   Streptococcus agalactiae NOT DETECTED NOT DETECTED Final   Streptococcus pneumoniae NOT DETECTED NOT DETECTED Final   Streptococcus pyogenes NOT DETECTED NOT DETECTED Final   A.calcoaceticus-baumannii NOT DETECTED NOT DETECTED Final   Bacteroides fragilis NOT DETECTED NOT DETECTED Final   Enterobacterales NOT DETECTED NOT DETECTED Final   Enterobacter cloacae complex NOT DETECTED NOT DETECTED Final   Escherichia coli NOT DETECTED NOT DETECTED Final   Klebsiella aerogenes NOT DETECTED NOT DETECTED Final   Klebsiella oxytoca NOT DETECTED NOT DETECTED Final   Klebsiella pneumoniae NOT DETECTED NOT DETECTED Final   Proteus  species NOT DETECTED NOT DETECTED Final   Salmonella species NOT DETECTED NOT DETECTED Final   Serratia marcescens NOT DETECTED NOT DETECTED Final   Haemophilus influenzae NOT DETECTED NOT DETECTED Final   Neisseria meningitidis NOT DETECTED NOT DETECTED Final   Pseudomonas aeruginosa NOT DETECTED NOT DETECTED Final   Stenotrophomonas maltophilia NOT DETECTED NOT DETECTED Final   Candida albicans NOT DETECTED NOT DETECTED Final   Candida auris NOT DETECTED NOT DETECTED Final   Candida glabrata NOT DETECTED NOT DETECTED Final   Candida krusei NOT DETECTED NOT DETECTED Final   Candida parapsilosis NOT DETECTED NOT DETECTED Final   Candida tropicalis NOT DETECTED NOT DETECTED Final   Cryptococcus neoformans/gattii NOT DETECTED NOT DETECTED Final   Meth resistant mecA/C and MREJ DETECTED (A) NOT DETECTED Final    Comment: CRITICAL RESULT CALLED TO, READ BACK BY AND VERIFIED WITH: PHARMD J. LEDFORD 01/01/23 @ 0330 BY AB Performed at [facility], [address]  [place], [place] [address]   Culture, blood (Routine X 2) w Reflex to ID Panel     Status: None (Preliminary result)   Collection Time: 12/31/22  7:43 AM   Specimen: BLOOD RIGHT HAND  Result Value Ref Range Status   Specimen Description BLOOD RIGHT HAND  Final   Special Requests   Final    BOTTLES DRAWN AEROBIC ONLY Blood Culture adequate volume   Culture   Final    NO GROWTH 3 DAYS Performed at [facility], [address] [place], [place] [address]    Report Status PENDING  Incomplete  Culture, blood (Routine X 2) w Reflex to ID Panel     Status: None (Preliminary result)   Collection Time: 01/02/23  3:51 PM   Specimen: BLOOD LEFT HAND  Result Value Ref Range Status   Specimen Description BLOOD LEFT HAND  Final   Special Requests   Final    BOTTLES DRAWN AEROBIC AND ANAEROBIC Blood Culture adequate volume   Culture   Final    NO GROWTH < 24 HOURS Performed at [facility], [address] [place], [place] [address]    Report Status PENDING  Incomplete  Culture, blood (Routine X 2) w Reflex to ID Panel     Status: None (Preliminary result)   Collection Time: 01/02/23  3:51 PM   Specimen: BLOOD RIGHT HAND  Result Value Ref Range Status   Specimen Description BLOOD RIGHT HAND  Final   Special Requests   Final    BOTTLES DRAWN AEROBIC ONLY Blood Culture results may not be optimal due to an inadequate volume of blood received in culture bottles   Culture   Final    NO GROWTH < 24 HOURS Performed at [facility], [address], [place], [place] [address]    Report Status PENDING  Incomplete     Rexene Alberts, MSN, NP-C [facility]  [email or web address] Pager: [phone] Office: [phone] [facility] Main Line: [phone] *Secure Chat Communication Welcome

## 2023-01-03 NOTE — Progress Notes (Signed)
Occupational Therapy Treatment Patient Details Name: Matthew Zamora MRN: [id number] DOB: 16-Jun-1941 Today's Date: 01/03/2023   History of present illness 82 yo male admitted to [facility] 7/12 with SOB with decompensated HF. 7/13 hypotension with sepsis. 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  Extubated post procedure but very confused. PMhx: COPD, CHF, AFib, CKD, HTN   OT comments  Patient with incremental progress toward patient focused goals.  Pain continues to be a significant limitation to willingness and ability to move.  Attempted light AROM to B upper extremities, but patient not able to do much.     Recommendations for follow up therapy are one component of a multi-disciplinary discharge planning process, led by the attending physician.  Recommendations may be updated based on patient status, additional functional criteria and insurance authorization.    Assistance Recommended at Discharge Frequent or constant Supervision/Assistance  Patient can return home with the following  Two people to help with walking and/or transfers;A lot of help with bathing/dressing/bathroom;Assistance with cooking/housework;Direct supervision/assist for financial management;Direct supervision/assist for medications management;Assist for transportation;Help with stairs or ramp for entrance   Equipment Recommendations  None recommended by OT    Recommendations for Other Services      Precautions / Restrictions Precautions Precautions: Fall Restrictions Weight Bearing Restrictions: No       Mobility Bed Mobility   Bed Mobility: Supine to Sit     Supine to sit: Max assist     General bed mobility comments: patient with ability to advance LE's, but pain is limiting effort and willingness to move.    Transfers                   General transfer comment: patient declined      Balance                                           ADL either performed or assessed with clinical judgement   ADL       Grooming: Wash/dry hands;Wash/dry face;Set up;Bed level           Upper Body Dressing : Moderate assistance;Bed level   Lower Body Dressing: Maximal assistance;Bed level                      Extremity/Trunk Assessment Upper Extremity Assessment Upper Extremity Assessment: Generalized weakness;RUE deficits/detail;LUE deficits/detail RUE Deficits / Details: Pain with touch RUE: Shoulder pain with ROM;Shoulder pain at rest LUE Deficits / Details: Pain with touch LUE: Shoulder pain at rest;Shoulder pain with ROM   Lower Extremity Assessment Lower Extremity Assessment: Defer to PT evaluation   Cervical / Trunk Assessment Cervical / Trunk Assessment: Kyphotic    Vision Patient Visual Report: No change from baseline     Perception Perception Perception: Not tested   Praxis Praxis Praxis: Not tested    Cognition Arousal/Alertness: Awake/alert Behavior During Therapy: Restless, Anxious Overall Cognitive Status: No family/caregiver present to determine baseline cognitive functioning  Pertinent Vitals/ Pain       Pain Assessment Pain Assessment: Faces Faces Pain Scale: Hurts whole lot Pain Location: everywhere, but especially L shoulder/elbow, BLE with movement, neck Pain Descriptors / Indicators: Discomfort, Grimacing, Moaning, Shooting, Guarding, Crying Pain Intervention(s): Monitored during session, Patient requesting pain meds-RN notified                                                          Frequency  Min 2X/week        Progress Toward Goals  OT Goals(current goals can now be found in the care plan section)  Progress towards OT goals: Progressing toward goals  Acute Rehab OT Goals OT Goal  Formulation: With patient Time For Goal Achievement: 01/13/23 Potential to Achieve Goals: Fair  Plan Discharge plan remains appropriate    Co-evaluation                 AM-PAC OT "6 Clicks" Daily Activity     Outcome Measure   Help from another person eating meals?: A Little Help from another person taking care of personal grooming?: A Little Help from another person toileting, which includes using toliet, bedpan, or urinal?: A Lot Help from another person bathing (including washing, rinsing, drying)?: A Lot Help from another person to put on and taking off regular upper body clothing?: A Lot Help from another person to put on and taking off regular lower body clothing?: A Lot 6 Click Score: 14    End of Session    OT Visit Diagnosis: Unsteadiness on feet (R26.81);Muscle weakness (generalized) (M62.81);History of falling (Z91.81);Pain Pain - Right/Left: Left Pain - part of body: Shoulder   Activity Tolerance Patient limited by pain   Patient Left in bed;with call bell/phone within reach;with bed alarm set   Nurse Communication Patient requests pain meds        Time: 4098-1191 OT Time Calculation (min): 21 min  Charges: OT General Charges $OT Visit: 1 Visit OT Treatments $Self Care/Home Management : 8-22 mins  01/03/2023  RP, OTR/L  Acute Rehabilitation Services  Office:  [phone]   Suzanna Obey 01/03/2023, 9:48 AM

## 2023-01-03 NOTE — TOC Benefit Eligibility Note (Signed)
Pharmacy Patient Advocate Encounter  Insurance verification completed.    The patient is insured through [facility] Medicare Part D  Ran test claim for Brilinta 90 mg and the current 30 day co-pay is $40.00.   This test claim was processed through [facility]- copay amounts may vary at other pharmacies due to pharmacy/plan contracts, or as the patient moves through the different stages of their insurance plan.    Roland Earl, CPHT Pharmacy Patient Advocate [facility] Pharm[facility] Patient Advocate Team Direct Number: [phone]  Fax: [phone]

## 2023-01-04 ENCOUNTER — Inpatient Hospital Stay (HOSPITAL_COMMUNITY): Payer: Medicare Other

## 2023-01-04 DIAGNOSIS — R7881 Bacteremia: Secondary | ICD-10-CM | POA: Diagnosis not present

## 2023-01-04 DIAGNOSIS — B9562 Methicillin resistant Staphylococcus aureus infection as the cause of diseases classified elsewhere: Secondary | ICD-10-CM | POA: Diagnosis not present

## 2023-01-04 LAB — CBC WITH DIFFERENTIAL/PLATELET
Abs Immature Granulocytes: 0.13 10*3/uL — ABNORMAL HIGH (ref 0.00–0.07)
Basophils Absolute: 0 10*3/uL (ref 0.0–0.1)
Basophils Relative: 0 %
Eosinophils Absolute: 0.1 10*3/uL (ref 0.0–0.5)
Eosinophils Relative: 1 %
HCT: 24.1 % — ABNORMAL LOW (ref 39.0–52.0)
Hemoglobin: 7.5 g/dL — ABNORMAL LOW (ref 13.0–17.0)
Immature Granulocytes: 1 %
Lymphocytes Relative: 4 %
Lymphs Abs: 0.4 10*3/uL — ABNORMAL LOW (ref 0.7–4.0)
MCH: 29 pg (ref 26.0–34.0)
MCHC: 31.1 g/dL (ref 30.0–36.0)
MCV: 93.1 fL (ref 80.0–100.0)
Monocytes Absolute: 0.8 10*3/uL (ref 0.1–1.0)
Monocytes Relative: 9 %
Neutro Abs: 8.5 10*3/uL — ABNORMAL HIGH (ref 1.7–7.7)
Neutrophils Relative %: 85 %
Platelets: 230 10*3/uL (ref 150–400)
RBC: 2.59 MIL/uL — ABNORMAL LOW (ref 4.22–5.81)
RDW: 16.1 % — ABNORMAL HIGH (ref 11.5–15.5)
WBC: 9.9 10*3/uL (ref 4.0–10.5)
nRBC: 0 % (ref 0.0–0.2)

## 2023-01-04 LAB — CULTURE, BLOOD (ROUTINE X 2): Culture: NO GROWTH

## 2023-01-04 LAB — MAGNESIUM: Magnesium: 1.9 mg/dL (ref 1.7–2.4)

## 2023-01-04 LAB — TROPONIN I (HIGH SENSITIVITY)
Troponin I (High Sensitivity): 13 ng/L
Troponin I (High Sensitivity): 14 ng/L

## 2023-01-04 LAB — GLUCOSE, CAPILLARY
Glucose-Capillary: 146 mg/dL — ABNORMAL HIGH (ref 70–99)
Glucose-Capillary: 144 mg/dL — ABNORMAL HIGH (ref 70–99)
Glucose-Capillary: 152 mg/dL — ABNORMAL HIGH (ref 70–99)
Glucose-Capillary: 134 mg/dL — ABNORMAL HIGH (ref 70–99)
Glucose-Capillary: 134 mg/dL — ABNORMAL HIGH (ref 70–99)

## 2023-01-04 LAB — BRAIN NATRIURETIC PEPTIDE: B Natriuretic Peptide: 1223.7 pg/mL — ABNORMAL HIGH (ref 0.0–100.0)

## 2023-01-04 LAB — BASIC METABOLIC PANEL
Anion gap: 15 (ref 5–15)
BUN: 31 mg/dL — ABNORMAL HIGH (ref 8–23)
CO2: 19 mmol/L — ABNORMAL LOW (ref 22–32)
Calcium: 8.7 mg/dL — ABNORMAL LOW (ref 8.9–10.3)
Chloride: 103 mmol/L (ref 98–111)
Creatinine, Ser: 1.45 mg/dL — ABNORMAL HIGH (ref 0.61–1.24)
GFR, Estimated: 49 mL/min — ABNORMAL LOW (ref >60)
Glucose, Bld: 146 mg/dL — ABNORMAL HIGH (ref 70–99)
Potassium: 4.2 mmol/L (ref 3.5–5.1)
Sodium: 137 mmol/L (ref 135–145)

## 2023-01-04 MED ORDER — CYCLOBENZAPRINE HCL 10 MG PO TABS
5.0000 mg | ORAL_TABLET | Freq: Three times a day (TID) | ORAL | Status: AC
  Administered 2023-01-04 – 2023-01-05 (×2): 5 mg via ORAL
  Filled 2023-01-04 (×2): qty 1

## 2023-01-04 MED ORDER — IPRATROPIUM-ALBUTEROL 0.5-2.5 (3) MG/3ML IN SOLN
3.0000 mL | Freq: Four times a day (QID) | RESPIRATORY_TRACT | Status: DC
  Administered 2023-01-04 (×3): 3 mL via RESPIRATORY_TRACT
  Filled 2023-01-04 (×3): qty 3

## 2023-01-04 MED ORDER — BUDESONIDE 0.25 MG/2ML IN SUSP
0.2500 mg | Freq: Two times a day (BID) | RESPIRATORY_TRACT | Status: DC
  Administered 2023-01-04 – 2023-01-16 (×21): 0.25 mg via RESPIRATORY_TRACT
  Filled 2023-01-04 (×23): qty 2

## 2023-01-04 MED ORDER — IPRATROPIUM-ALBUTEROL 0.5-2.5 (3) MG/3ML IN SOLN
3.0000 mL | Freq: Three times a day (TID) | RESPIRATORY_TRACT | Status: DC
  Administered 2023-01-05: 3 mL via RESPIRATORY_TRACT
  Filled 2023-01-04: qty 3

## 2023-01-04 MED ORDER — FUROSEMIDE 10 MG/ML IJ SOLN
40.0000 mg | Freq: Once | INTRAMUSCULAR | Status: AC
  Administered 2023-01-04: 40 mg via INTRAVENOUS
  Filled 2023-01-04: qty 4

## 2023-01-04 MED ORDER — THIAMINE MONONITRATE 100 MG PO TABS
100.0000 mg | ORAL_TABLET | Freq: Every day | ORAL | Status: AC
  Administered 2023-01-05 – 2023-01-06 (×2): 100 mg via ORAL
  Filled 2023-01-04 (×2): qty 1

## 2023-01-04 NOTE — Plan of Care (Signed)
  Problem: Fluid Volume: Goal: Hemodynamic stability will improve Outcome: Progressing   Problem: Clinical Measurements: Goal: Diagnostic test results will improve Outcome: Progressing Goal: Signs and symptoms of infection will decrease Outcome: Progressing   Problem: Respiratory: Goal: Ability to maintain adequate ventilation will improve Outcome: Progressing   Problem: Education: Goal: Knowledge of General Education information will improve Description: Including pain rating scale, medication(s)/side effects and non-pharmacologic comfort measures Outcome: Progressing   Problem: Health Behavior/Discharge Planning: Goal: Ability to manage health-related needs will improve Outcome: Progressing   Problem: Clinical Measurements: Goal: Ability to maintain clinical measurements within normal limits will improve Outcome: Progressing Goal: Will remain free from infection Outcome: Progressing Goal: Diagnostic test results will improve Outcome: Progressing Goal: Respiratory complications will improve Outcome: Progressing Goal: Cardiovascular complication will be avoided Outcome: Progressing   Problem: Activity: Goal: Risk for activity intolerance will decrease Outcome: Progressing   Problem: Nutrition: Goal: Adequate nutrition will be maintained Outcome: Progressing   Problem: Coping: Goal: Level of anxiety will decrease Outcome: Progressing   Problem: Elimination: Goal: Will not experience complications related to bowel motility Outcome: Progressing Goal: Will not experience complications related to urinary retention Outcome: Progressing   Problem: Pain Managment: Goal: General experience of comfort will improve Outcome: Progressing   Problem: Safety: Goal: Ability to remain free from injury will improve Outcome: Progressing   Problem: Skin Integrity: Goal: Risk for impaired skin integrity will decrease Outcome: Progressing   Problem: Education: Goal:  Understanding of CV disease, CV risk reduction, and recovery process will improve Outcome: Progressing Goal: Individualized Educational Video(s) Outcome: Progressing   Problem: Activity: Goal: Ability to return to baseline activity level will improve Outcome: Progressing   Problem: Cardiovascular: Goal: Ability to achieve and maintain adequate cardiovascular perfusion will improve Outcome: Progressing Goal: Vascular access site(s) Level 0-1 will be maintained Outcome: Progressing   Problem: Health Behavior/Discharge Planning: Goal: Ability to safely manage health-related needs after discharge will improve Outcome: Progressing   Problem: Education: Goal: Knowledge of disease or condition will improve Outcome: Progressing Goal: Knowledge of secondary prevention will improve (MUST DOCUMENT ALL) Outcome: Progressing Goal: Knowledge of patient specific risk factors will improve Loraine Leriche N/A or DELETE if not current risk factor) Outcome: Progressing   Problem: Ischemic Stroke/TIA Tissue Perfusion: Goal: Complications of ischemic stroke/TIA will be minimized Outcome: Progressing   Problem: Coping: Goal: Will verbalize positive feelings about self Outcome: Progressing Goal: Will identify appropriate support needs Outcome: Progressing   Problem: Health Behavior/Discharge Planning: Goal: Ability to manage health-related needs will improve Outcome: Progressing Goal: Goals will be collaboratively established with patient/family Outcome: Progressing   Problem: Self-Care: Goal: Ability to participate in self-care as condition permits will improve Outcome: Progressing Goal: Verbalization of feelings and concerns over difficulty with self-care will improve Outcome: Progressing Goal: Ability to communicate needs accurately will improve Outcome: Progressing   Problem: Nutrition: Goal: Risk of aspiration will decrease Outcome: Progressing Goal: Dietary intake will improve Outcome:  Progressing

## 2023-01-04 NOTE — Progress Notes (Addendum)
   Palliative Medicine Inpatient Follow Up Note HPI: 82 year old man with myriad of comorobidities including refractory orthostatic hypotension, HFrEF, chronic N/V who presented w/ SOB at [facility] acteremic with MRSA as well has + for rhinovirus. (+( R sided weakness w/ L ICA thrombus transferred to [facility] for emergent thrombectomy.    Palliative care has been asked to get involved in the setting of multiple readmissions, high chronic disease burden, and declined health to further discuss goals of care.   Today's Discussion 01/04/2023  *Please note that this is a verbal dictation therefore any spelling or grammatical errors are due to the "Dragon [employer] One" system interpretation.  Chart reviewed inclusive of vital signs, progress notes, laboratory results, and diagnostic images.   I met with Matthew Zamora this morning, he was resting though I did awaken him at which time he shares that he is experiencing pain in his right shoulder. I provided passive ROM which was very uncomfortable for Matthew Zamora. He had no family present at bedside this morning.  I met with Matthew Zamora's RN, Matthew Zamora who reviews with me that he had just recently had pain medication. She shares that he has been sleeping much of the morning for her. ____________________________ Addendum:  I met with patients son Matthew Zamora and DIL Matthew Zamora. Provided an update to them. Discussed the importance of mobility and nutrition.   Started on antispasmodic for muscle spasms.  Questions and concerns addressed/Palliative Support Provided.   Objective Assessment: Vital Signs Vitals:   01/04/23 0800 01/04/23 1215  BP: (!) 146/75 (!) 107/54  Pulse: 72 60  Resp: (!) 21 15  Temp: 97.7 F (36.5 C) 97.7 F (36.5 C)  SpO2: 98% 100%    Intake/Output Summary (Last 24 hours) at 01/04/2023 1221 Last data filed at 01/03/2023 2336 Gross per 24 hour  Intake --  Output 775 ml  Net -775 ml   Last Weight  Most recent update: 01/04/2023  4:07 AM    Weight  72.3 kg (159  lb 6.3 oz)            Gen:  Elderly Caucasian M chronically ill appearing HEENT: moist mucous membranes CV: Irregular rate and rhythm  PULM:  On 2LPM [place], breathing is even and nonlabored ABD: soft/nontender  EXT: No edema  Neuro: Aware of who he is and where he is though not understanding why  SUMMARY OF RECOMMENDATIONS   Full Code / Full Scope of Care    Allow time for outcomes   PT/OT/Speech    Ongoing PMT support  Symptoms:  Neck Pain: - Tylenol 1g TID - Oxycodone 5mg  Q6H PRN - Muscle rub  - Flexeril TID  Delirium: -Lights and TV off, minimize interruptions at night -Blinds open and lights on during day -Glasses/hearing aid with patient -Get up during the day -Encourage a familiar face to remain present throughout the day -Frequent reorientation  Billing based on MDM: High ______________________________________________________________________________________ Matthew Zamora [place] Palliative Medicine Team Team Cell Phone: [phone] Please utilize secure chat with additional questions, if there is no response within 30 minutes please call the above phone number  Palliative Medicine Team providers are available by phone from 7am to 7pm daily and can be reached through the team cell phone.  Should this patient require assistance outside of these hours, please call the patient's attending physician.

## 2023-01-04 NOTE — Progress Notes (Signed)
PROGRESS NOTE    Matthew Zamora  LKG:[id number] DOB: 22-Feb-1941 DOA: 12/29/2022 PCP: Jerl Mina, MD   Brief Narrative:  82 year old with history of GERD, refractory orthostatic hypotension, CHF with reduced EF, chronic nausea and vomiting initially presented to [facility] ED with acute shortness of breath chest pain.  Initial evaluation showed acute CHF complicated by atrial fibrillation with RVR.  Initially hypotensive resuscitated with fluid and started on amiodarone drip.  Patient spiked a fever therefore started on broad-spectrum antibiotics eventually blood cultures grew MRSA and respiratory viral panel was positive for rhinovirus.  On 7/14 patient also suffered from acute right-sided weakness with aphasia.  CT angio showed acute left occlusion ICA.  Neurology and vascular were also consulted and patient was transferred to [facility] for neuroIR eval.  Patient underwent left carotid stent placement and post procedure transferred to the ICU.  01/03/2023: Patient seen.  No significant history from patient.  Available records reviewed.  No new complaints today.  Mild improvement in renal function noted.  Serum creatinine has improved from 1.64-1.39.  Phosphorus is 2.4.  Blood pressure is 151/71 mmHg, will titrate the midodrine downwards to 5 Mg p.o. 3 times daily.  Continue to manage expectantly.  01/04/2023: Patient seen.  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  No significant changes noted when I saw the patient.  Will take ahead and obtain a chest x-ray, cardiac enzymes and ABG.  Will also cycle cardiac enzymes.  IV Lasix 40 mg x 1 dose..   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  Neurology and IR planning to continue Eliquis,  Brilinta.  Continue statin.  PT/OT-SNF   MRSA bacteremia, source from cardiac device Septic shock, resolved Repeat blood cultures still remain positive for MRSA bacteremia.  ID is following, patient remains on IV vancomycin.  CT scan does not show any evidence of cervical/thoracic discitis.  He does have an ICD in place with concerns of vegetation.  Currently no plans for extraction but likely will need TEE Per EP cardiology, not a candidate for extraction due to high risk 01/04/2023: Continue IV vancomycin.   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  Due to soft blood pressure, holding off on introducing any aggressive diuretics at this time.  Continue Lopressor 50 mg twice daily.  Also on midodrine 30 mg every 8 hours 01/04/2023: Give extra IV Lasix 40 Mg x 1 dose.  Follow cardiac BNP, chest x-ray and cardiac enzymes.   Atrial fibrillation with RVR Rate is still elevated, on metoprolol 50 mg twice daily.  IV as needed. Eliquis BID Consult cardiology We may need to restart amiodarone drip 01/03/2023: Heart rate is currently controlled.  Patient is on IV vancomycin and Eliquis.  Dysphagia with severe nutritional deficiency - Cortrak in place, getting tube feeds.  Monitor for refeeding syndrome.  As we approach goal rate and if patient would like p.o. intake, we can slowly transition to nocturnal tube feeds 01/03/2023: Continue NG tube feeds.  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  No evidence of discitis seen on CT.  CK normal.  Pain control,  physical therapy.  Voltaren gel   Acute on chronic CKD stage IIIb NAGMA Hyponatremia, improving Baseline creatinine around 1.5, admission creatinine 2.12.  Slowly improving at this time.  Monitor urine output. 01/03/2023: Improvement in serum creatinine noted  (1.64-1.39).   Normocytic anemia, likely anemia of chronic disease Hemoglobin stable at 8.3   Thrombocytopenia Platelets had drifted down to 92 now slowly recovering    GERD -PPI   Chronic hypotension On midodrine 30 mg 3 times daily, Mestinon   Depression/anxiety -Continue home sertraline 25 mg daily    Acute metabolic encephalopathy secondary to sepsis, improved Improved      DVT prophylaxis: Eliquis Code Status: Full code Family Communication:   Chronically ill patient with MRSA bacteremia and multiple ongoing issues.  Will be in the hospital for least next 3-5 days       Diet Orders (From admission, onward)     Start     Ordered   12/30/22 1025  Diet regular Room service appropriate? Yes with Assist; Fluid consistency: Thin  Diet effective now       Comments: Meds whole with pudding  Question Answer Comment  Room service appropriate? Yes with Assist   Fluid consistency: Thin      12/30/22 1024            Subjective: Seen at bedside, very poor oral intake.  Denies any complaints.   Examination: Constitutional: Chronically ill looking.  Not in acute distress.  Cortrak in place Respiratory: Decreased air entry. Cardiovascular: S1-S2.   Abdomen: Soft and nontender. Extremities: No leg edema.  Swelling of right upper extremity. Neurologic: Patient is awake.  Right-sided weakness, worse right upper extremity.   Objective: Vitals:   01/03/23 2235 01/04/23 0000 01/04/23 0340 01/04/23 0800  BP: (!) 128/57 (!) 127/56 (!) 124/54 (!) 146/75  Pulse: 62 62 64 72  Resp: 20 20 (!) 21 (!) 21  Temp: 98.2 F (36.8 C)  98.1 F (36.7 C) 97.7 F (36.5 C)  TempSrc: Axillary  Oral Oral  SpO2: 99% 99% 98% 98%  Weight:   72.3 kg     Intake/Output Summary (Last 24 hours) at 01/04/2023 0938 Last data filed at 01/03/2023 2336 Gross per 24 hour  Intake --  Output 775 ml  Net -775 ml   Filed Weights   01/02/23 0453 01/03/23 0459 01/04/23 0340  Weight: 73.5 kg  71.4 kg 72.3 kg    Scheduled Meds:  acetaminophen  1,000 mg Oral TID   apixaban  5 mg Oral BID   atorvastatin  80 mg Oral QHS   diclofenac Sodium  4 g Topical QID   feeding supplement  237 mL Oral BID BM   feeding supplement (PROSource TF20)  60 mL Per Tube Daily   furosemide  40 mg Intravenous Once   gabapentin  100 mg Oral QHS   metoprolol tartrate  75 mg Oral BID   midodrine  5 mg Oral TID WC   multivitamin  1 tablet Oral QHS   mupirocin ointment  1 Application Nasal BID   pantoprazole  40 mg Oral BID   pyridostigmine  60 mg Oral Q8H   sertraline  25 mg Oral Daily   sucralfate  1 g Oral TID WC & HS   thiamine  100 mg Per Tube Daily   ticagrelor  90 mg Oral BID   Or   ticagrelor  90 mg Per Tube BID   Continuous Infusions:  amiodarone 30 mg/hr (01/04/23 0301)   feeding supplement (  OSMOLITE 1.5 CAL) 1,000 mL (01/03/23 1300)   vancomycin 1,000 mg (01/03/23 2125)    Nutritional status Signs/Symptoms: severe fat depletion, severe muscle depletion Interventions: Tube feeding, Ensure Enlive (each supplement provides 350kcal and 20 grams of protein), MVI, Refer to RD note for recommendations Body mass index is 24.24 kg/m.  Data Reviewed:   CBC: Recent Labs  Lab 12/30/22 0322 12/31/22 0519 01/01/23 0113 01/02/23 0643 01/03/23 0450  WBC 9.0 9.4 11.3* 11.8* 11.4*  HGB 7.4* 7.3* 8.1* 8.3* 8.7*  HCT 22.7* 23.2* 26.7* 27.1* 29.1*  MCV 92.3 91.7 96.7 93.1 93.6  PLT 92*  89* 101* 120* 167 207   Basic Metabolic Panel: Recent Labs  Lab 12/30/22 0322 12/31/22 0519 01/01/23 0113 01/01/23 1734 01/02/23 0643 01/02/23 1734 01/03/23 0450  NA 132* 134* 135  --  135  --  138  K 3.8 3.7 4.5  --  4.1  --  4.4  CL 106 109 111  --  110  --  110  CO2 18* 18* 16*  --  19*  --  20*  GLUCOSE 121* 81 79  --  117*  --  99  BUN 31* 24* 22  --  23  --  26*  CREATININE 1.73* 1.75* 1.63*  --  1.64*  --  1.39*  CALCIUM 7.9* 8.4* 8.6*  --  8.6*  --  9.1  MG 2.1 2.1 2.1 2.0 2.0 1.8 2.0   PHOS 2.9  --   --  2.6 2.3* 2.1* 2.4*   GFR: Estimated Creatinine Clearance: 41 mL/min (A) (by C-G formula based on SCr of 1.39 mg/dL (H)). Liver Function Tests: Recent Labs  Lab 12/29/22 0836  AST 28  ALT 20  ALKPHOS 53  BILITOT 0.9  PROT 5.1*  ALBUMIN 2.8*   No results for input(s): "LIPASE", "AMYLASE" in the last 168 hours. No results for input(s): "AMMONIA" in the last 168 hours. Coagulation Profile: Recent Labs  Lab 12/30/22 0322  INR 1.4*   Cardiac Enzymes: Recent Labs  Lab 12/31/22 0924  CKTOTAL 92   BNP (last 3 results) No results for input(s): "PROBNP" in the last 8760 hours. HbA1C: No results for input(s): "HGBA1C" in the last 72 hours. CBG: Recent Labs  Lab 01/03/23 1434 01/03/23 1700 01/03/23 2026 01/04/23 0415 01/04/23 0810  GLUCAP 123* 172* 132* 146* 144*   Lipid Profile: No results for input(s): "CHOL", "HDL", "LDLCALC", "TRIG", "CHOLHDL", "LDLDIRECT" in the last 72 hours. Thyroid Function Tests: No results for input(s): "TSH", "T4TOTAL", "FREET4", "T3FREE", "THYROIDAB" in the last 72 hours. Anemia Panel: No results for input(s): "VITAMINB12", "FOLATE", "FERRITIN", "TIBC", "IRON", "RETICCTPCT" in the last 72 hours.  Sepsis Labs: Recent Labs  Lab 12/28/22 1340 12/29/22 0836  PROCALCITON  --  5.58  LATICACIDVEN 1.6  --     Recent Results (from the past 240 hour(s))  MRSA Next Gen by PCR, Nasal     Status: Abnormal   Collection Time: 12/27/22  9:38 PM   Specimen: Nasal Mucosa; Nasal Swab  Result Value Ref Range Status   MRSA by PCR Next Gen DETECTED (A) NOT DETECTED Final    Comment: CRITICAL RESULT CALLED TO, READ BACK BY AND VERIFIED WITH: MELISSA COBB RN @ [phone] 12/28/22 [facility] (NOTE) The GeneXpert MRSA Assay (FDA approved for NASAL specimens only), is one component of a comprehensive MRSA colonization surveillance program. It is not intended to diagnose MRSA infection nor to guide or monitor treatment for MRSA infections. Test  performance is not FDA approved  in patients less than 50 years old. Performed at [facility], [address] [place], [place] [address]   Culture, blood (Routine X 2) w Reflex to ID Panel     Status: Abnormal   Collection Time: 12/28/22 10:47 AM   Specimen: BLOOD RIGHT HAND  Result Value Ref Range Status   Specimen Description   Final    BLOOD RIGHT HAND Performed at [facility], [address] [place], [place] [address]    Special Requests   Final    BOTTLES DRAWN AEROBIC AND ANAEROBIC Blood Culture adequate volume Performed at [facility], [address] [place], [place] [address]    Culture  Setup Time   Final    GRAM POSITIVE COCCI IN BOTH AEROBIC AND ANAEROBIC BOTTLES CRITICAL RESULT CALLED TO, READ BACK BY AND VERIFIED WITH: TREY GREENWOOD @ 2146 12/28/22 LFD Performed at [facility], [address] [place], [place] [address]    Culture (A)  Final    STAPHYLOCOCCUS AUREUS SUSCEPTIBILITIES PERFORMED ON PREVIOUS CULTURE WITHIN THE LAST 5 DAYS. Performed at [facility], [address], [place], [place] [address]    Report Status 12/31/2022 FINAL  Final  Culture, blood (Routine X 2) w Reflex to ID Panel     Status: Abnormal   Collection Time: 12/28/22 10:48 AM   Specimen: BLOOD LEFT HAND  Result Value Ref Range Status   Specimen Description   Final    BLOOD LEFT HAND Performed at [facility], [address], [place], [place] [address]    Special Requests   Final    BOTTLES DRAWN AEROBIC AND ANAEROBIC Blood Culture adequate volume Performed at [facility], [address] [place], [place] [address]    Culture  Setup Time   Final    GRAM POSITIVE COCCI IN BOTH AEROBIC AND ANAEROBIC BOTTLES Organism ID to follow CRITICAL RESULT CALLED TO, READ BACK BY AND VERIFIED WITH: TREY GREENWOOD @2146  12/28/22 LFD Performed at [facility], [address] [place], [place] [address]    Culture  METHICILLIN RESISTANT STAPHYLOCOCCUS AUREUS (A)  Final   Report Status 12/31/2022 FINAL  Final   Organism ID, Bacteria METHICILLIN RESISTANT STAPHYLOCOCCUS AUREUS  Final      Susceptibility   Methicillin resistant staphylococcus aureus - MIC*    CIPROFLOXACIN >=8 RESISTANT Resistant     ERYTHROMYCIN >=8 RESISTANT Resistant     GENTAMICIN <=0.5 SENSITIVE Sensitive     OXACILLIN >=4 RESISTANT Resistant     TETRACYCLINE <=1 SENSITIVE Sensitive     VANCOMYCIN <=0.5 SENSITIVE Sensitive     TRIMETH/SULFA >=320 RESISTANT Resistant     CLINDAMYCIN <=0.25 SENSITIVE Sensitive     RIFAMPIN <=0.5 SENSITIVE Sensitive     Inducible Clindamycin NEGATIVE Sensitive     LINEZOLID 2 SENSITIVE Sensitive     * METHICILLIN RESISTANT STAPHYLOCOCCUS AUREUS  Blood Culture ID Panel (Reflexed)     Status: Abnormal   Collection Time: 12/28/22 10:48 AM  Result Value Ref Range Status   Enterococcus faecalis NOT DETECTED NOT DETECTED Final   Enterococcus Faecium NOT DETECTED NOT DETECTED Final   Listeria monocytogenes NOT DETECTED NOT DETECTED Final   Staphylococcus species DETECTED (A) NOT DETECTED Final    Comment: CRITICAL RESULT CALLED TO, READ BACK BY AND VERIFIED WITH: TREY GREENWOOD @ 2146 12/28/22 LFD    Staphylococcus aureus (BCID) DETECTED (A) NOT DETECTED Final    Comment: Methicillin (oxacillin)-resistant Staphylococcus aureus (MRSA). MRSA is predictably resistant to  beta-lactam antibiotics (except ceftaroline). Preferred therapy is vancomycin unless clinically contraindicated. Patient requires contact precautions if  hospitalized. CRITICAL RESULT CALLED TO, READ BACK BY AND VERIFIED WITH: TREY GREENWOOD @ 2146 12/28/22 LFD    Staphylococcus epidermidis NOT DETECTED NOT DETECTED Final   Staphylococcus lugdunensis NOT DETECTED NOT DETECTED Final   Streptococcus species NOT DETECTED NOT DETECTED Final   Streptococcus agalactiae NOT DETECTED NOT DETECTED Final   Streptococcus pneumoniae NOT DETECTED  NOT DETECTED Final   Streptococcus pyogenes NOT DETECTED NOT DETECTED Final   A.calcoaceticus-baumannii NOT DETECTED NOT DETECTED Final   Bacteroides fragilis NOT DETECTED NOT DETECTED Final   Enterobacterales NOT DETECTED NOT DETECTED Final   Enterobacter cloacae complex NOT DETECTED NOT DETECTED Final   Escherichia coli NOT DETECTED NOT DETECTED Final   Klebsiella aerogenes NOT DETECTED NOT DETECTED Final   Klebsiella oxytoca NOT DETECTED NOT DETECTED Final   Klebsiella pneumoniae NOT DETECTED NOT DETECTED Final   Proteus species NOT DETECTED NOT DETECTED Final   Salmonella species NOT DETECTED NOT DETECTED Final   Serratia marcescens NOT DETECTED NOT DETECTED Final   Haemophilus influenzae NOT DETECTED NOT DETECTED Final   Neisseria meningitidis NOT DETECTED NOT DETECTED Final   Pseudomonas aeruginosa NOT DETECTED NOT DETECTED Final   Stenotrophomonas maltophilia NOT DETECTED NOT DETECTED Final   Candida albicans NOT DETECTED NOT DETECTED Final   Candida auris NOT DETECTED NOT DETECTED Final   Candida glabrata NOT DETECTED NOT DETECTED Final   Candida krusei NOT DETECTED NOT DETECTED Final   Candida parapsilosis NOT DETECTED NOT DETECTED Final   Candida tropicalis NOT DETECTED NOT DETECTED Final   Cryptococcus neoformans/gattii NOT DETECTED NOT DETECTED Final   Meth resistant mecA/C and MREJ DETECTED (A) NOT DETECTED Final    Comment: CRITICAL RESULT CALLED TO, READ BACK BY AND VERIFIED WITH: TREY GREENWOOD @ 2146 12/28/22 LFD Performed at [facility], [address] [place], [place] [address]   Respiratory (~20 pathogens) panel by PCR     Status: None   Collection Time: 12/28/22 11:55 AM   Specimen: Nasopharyngeal Swab; Respiratory  Result Value Ref Range Status   Adenovirus NOT DETECTED NOT DETECTED Final   Coronavirus 229E NOT DETECTED NOT DETECTED Final    Comment: (NOTE) The Coronavirus on the Respiratory Panel, DOES NOT test for the novel  Coronavirus (2019  nCoV)    Coronavirus HKU1 NOT DETECTED NOT DETECTED Final   Coronavirus NL63 NOT DETECTED NOT DETECTED Final   Coronavirus OC43 NOT DETECTED NOT DETECTED Final   Metapneumovirus NOT DETECTED NOT DETECTED Final   Rhinovirus / Enterovirus NOT DETECTED NOT DETECTED Final   Influenza A NOT DETECTED NOT DETECTED Final   Influenza B NOT DETECTED NOT DETECTED Final   Parainfluenza Virus 1 NOT DETECTED NOT DETECTED Final   Parainfluenza Virus 2 NOT DETECTED NOT DETECTED Final   Parainfluenza Virus 3 NOT DETECTED NOT DETECTED Final   Parainfluenza Virus 4 NOT DETECTED NOT DETECTED Final   Respiratory Syncytial Virus NOT DETECTED NOT DETECTED Final   Bordetella pertussis NOT DETECTED NOT DETECTED Final   Bordetella Parapertussis NOT DETECTED NOT DETECTED Final   Chlamydophila pneumoniae NOT DETECTED NOT DETECTED Final   Mycoplasma pneumoniae NOT DETECTED NOT DETECTED Final    Comment: Performed at [facility], [address], [place], [place] [address]  SARS Coronavirus 2 by RT PCR (hospital order, performed in [facility] hospital lab) *cepheid single result test* Anterior Nasal Swab     Status: None  Collection Time: 12/29/22  1:06 PM   Specimen: Anterior Nasal Swab  Result Value Ref Range Status   SARS Coronavirus 2 by RT PCR NEGATIVE NEGATIVE Final    Comment: Performed at [facility], [address], [place], [place] [address]  MRSA Next Gen by PCR, Nasal     Status: Abnormal   Collection Time: 12/29/22  6:50 PM   Specimen: Nasal Mucosa; Nasal Swab  Result Value Ref Range Status   MRSA by PCR Next Gen DETECTED (A) NOT DETECTED Final    Comment: RESULT CALLED TO, READ BACK BY AND VERIFIED WITH: S. SAVAGE  RN 12/29/22 @ 2126 BY AB (NOTE) The GeneXpert MRSA Assay (FDA approved for NASAL specimens only), is one component of a comprehensive MRSA colonization surveillance program. It is not intended to diagnose MRSA infection nor to guide or monitor treatment for MRSA  infections. Test performance is not FDA approved in patients less than 59 years old. Performed at [facility], [address] [place], [place] [address]   Culture, blood (Routine X 2) w Reflex to ID Panel     Status: Abnormal   Collection Time: 12/31/22  7:37 AM   Specimen: BLOOD RIGHT HAND  Result Value Ref Range Status   Specimen Description BLOOD RIGHT HAND  Final   Special Requests   Final    BOTTLES DRAWN AEROBIC AND ANAEROBIC Blood Culture adequate volume   Culture  Setup Time   Final    GRAM POSITIVE COCCI IN CLUSTERS ANAEROBIC BOTTLE ONLY CRITICAL RESULT CALLED TO, READ BACK BY AND VERIFIED WITH: PHARMD J. LEDFORD 01/01/23 @ 0330 BY AB Performed at [facility], [address] [place], [place] [address]    Culture METHICILLIN RESISTANT STAPHYLOCOCCUS AUREUS (A)  Final   Report Status 01/03/2023 FINAL  Final   Organism ID, Bacteria METHICILLIN RESISTANT STAPHYLOCOCCUS AUREUS  Final      Susceptibility   Methicillin resistant staphylococcus aureus - MIC*    CIPROFLOXACIN >=8 RESISTANT Resistant     ERYTHROMYCIN >=8 RESISTANT Resistant     GENTAMICIN <=0.5 SENSITIVE Sensitive     OXACILLIN >=4 RESISTANT Resistant     TETRACYCLINE <=1 SENSITIVE Sensitive     VANCOMYCIN 1 SENSITIVE Sensitive     TRIMETH/SULFA >=320 RESISTANT Resistant     CLINDAMYCIN <=0.25 SENSITIVE Sensitive     RIFAMPIN <=0.5 SENSITIVE Sensitive     Inducible Clindamycin NEGATIVE Sensitive     LINEZOLID 2 SENSITIVE Sensitive     * METHICILLIN RESISTANT STAPHYLOCOCCUS AUREUS  Blood Culture ID Panel (Reflexed)     Status: Abnormal   Collection Time: 12/31/22  7:37 AM  Result Value Ref Range Status   Enterococcus faecalis NOT DETECTED NOT DETECTED Final   Enterococcus Faecium NOT DETECTED NOT DETECTED Final   Listeria monocytogenes NOT DETECTED NOT DETECTED Final   Staphylococcus species DETECTED (A) NOT DETECTED Final    Comment: CRITICAL RESULT CALLED TO, READ BACK BY AND VERIFIED  WITH: PHARMD J. LEDFORD 01/01/23 @ 0330 BY AB    Staphylococcus aureus (BCID) DETECTED (A) NOT DETECTED Final    Comment: Methicillin (oxacillin)-resistant Staphylococcus aureus (MRSA). MRSA is predictably resistant to beta-lactam antibiotics (except ceftaroline). Preferred therapy is vancomycin unless clinically contraindicated. Patient requires contact precautions if  hospitalized. CRITICAL RESULT CALLED TO, READ BACK BY AND VERIFIED WITH: PHARMD J. LEDFORD 01/01/23 @ 0330 BY AB    Staphylococcus epidermidis NOT DETECTED NOT DETECTED Final   Staphylococcus lugdunensis NOT DETECTED NOT DETECTED  Final   Streptococcus species NOT DETECTED NOT DETECTED Final   Streptococcus agalactiae NOT DETECTED NOT DETECTED Final   Streptococcus pneumoniae NOT DETECTED NOT DETECTED Final   Streptococcus pyogenes NOT DETECTED NOT DETECTED Final   A.calcoaceticus-baumannii NOT DETECTED NOT DETECTED Final   Bacteroides fragilis NOT DETECTED NOT DETECTED Final   Enterobacterales NOT DETECTED NOT DETECTED Final   Enterobacter cloacae complex NOT DETECTED NOT DETECTED Final   Escherichia coli NOT DETECTED NOT DETECTED Final   Klebsiella aerogenes NOT DETECTED NOT DETECTED Final   Klebsiella oxytoca NOT DETECTED NOT DETECTED Final   Klebsiella pneumoniae NOT DETECTED NOT DETECTED Final   Proteus species NOT DETECTED NOT DETECTED Final   Salmonella species NOT DETECTED NOT DETECTED Final   Serratia marcescens NOT DETECTED NOT DETECTED Final   Haemophilus influenzae NOT DETECTED NOT DETECTED Final   Neisseria meningitidis NOT DETECTED NOT DETECTED Final   Pseudomonas aeruginosa NOT DETECTED NOT DETECTED Final   Stenotrophomonas maltophilia NOT DETECTED NOT DETECTED Final   Candida albicans NOT DETECTED NOT DETECTED Final   Candida auris NOT DETECTED NOT DETECTED Final   Candida glabrata NOT DETECTED NOT DETECTED Final   Candida krusei NOT DETECTED NOT DETECTED Final   Candida parapsilosis NOT DETECTED NOT  DETECTED Final   Candida tropicalis NOT DETECTED NOT DETECTED Final   Cryptococcus neoformans/gattii NOT DETECTED NOT DETECTED Final   Meth resistant mecA/C and MREJ DETECTED (A) NOT DETECTED Final    Comment: CRITICAL RESULT CALLED TO, READ BACK BY AND VERIFIED WITH: PHARMD J. LEDFORD 01/01/23 @ 0330 BY AB Performed at [facility], [address], [place], [place] [address]   Culture, blood (Routine X 2) w Reflex to ID Panel     Status: None (Preliminary result)   Collection Time: 12/31/22  7:43 AM   Specimen: BLOOD RIGHT HAND  Result Value Ref Range Status   Specimen Description BLOOD RIGHT HAND  Final   Special Requests   Final    BOTTLES DRAWN AEROBIC ONLY Blood Culture adequate volume   Culture   Final    NO GROWTH 4 DAYS Performed at [facility], [address] [place], [place] [address]    Report Status PENDING  Incomplete  Culture, blood (Routine X 2) w Reflex to ID Panel     Status: None (Preliminary result)   Collection Time: 01/02/23  3:51 PM   Specimen: BLOOD LEFT HAND  Result Value Ref Range Status   Specimen Description BLOOD LEFT HAND  Final   Special Requests   Final    BOTTLES DRAWN AEROBIC AND ANAEROBIC Blood Culture adequate volume   Culture   Final    NO GROWTH 2 DAYS Performed at [facility], [address], [place], [place] [address]    Report Status PENDING  Incomplete  Culture, blood (Routine X 2) w Reflex to ID Panel     Status: None (Preliminary result)   Collection Time: 01/02/23  3:51 PM   Specimen: BLOOD RIGHT HAND  Result Value Ref Range Status   Specimen Description BLOOD RIGHT HAND  Final   Special Requests   Final    BOTTLES DRAWN AEROBIC ONLY Blood Culture results may not be optimal due to an inadequate volume of blood received in culture bottles   Culture   Final    NO GROWTH 2 DAYS Performed at [facility], [address], [place], [place] [address]    Report Status PENDING  Incomplete  Radiology  Studies: DG Abd Portable 1V  Result Date: 01/03/2023 CLINICAL DATA:  Feeding tube placement. EXAM: PORTABLE ABDOMEN - 1 VIEW COMPARISON:  01/01/2023 FINDINGS: Feeding tube has been placed, tip overlying the level of the mid stomach. Partially imaged chest shows cardiomegaly and pacemaker leads. Bowel gas pattern is nonobstructed. IMPRESSION: Feeding tube tip at the level of the mid stomach. Electronically Signed   By: Norva Pavlov M.D.   On: 01/03/2023 12:05           LOS: 6 days   Time spent= 55 mins    Barnetta Chapel, MD [facility]  If 7PM-7AM, please contact night-coverage  01/04/2023, 9:38 AM \

## 2023-01-04 NOTE — Progress Notes (Signed)
Progress Note  Patient Name: Matthew Zamora Date of Encounter: 01/04/2023  Primary Cardiologist: None   Subjective   C/o sob. No chest pain.   Inpatient Medications    Scheduled Meds:  acetaminophen  1,000 mg Oral TID   apixaban  5 mg Oral BID   atorvastatin  80 mg Oral QHS   budesonide (PULMICORT) nebulizer solution  0.25 mg Nebulization BID   diclofenac Sodium  4 g Topical QID   feeding supplement  237 mL Oral BID BM   feeding supplement (PROSource TF20)  60 mL Per Tube Daily   gabapentin  100 mg Oral QHS   ipratropium-albuterol  3 mL Nebulization Q6H   metoprolol tartrate  75 mg Oral BID   midodrine  5 mg Oral TID WC   multivitamin  1 tablet Oral QHS   mupirocin ointment  1 Application Nasal BID   pantoprazole  40 mg Oral BID   pyridostigmine  60 mg Oral Q8H   sertraline  25 mg Oral Daily   sucralfate  1 g Oral TID WC & HS   [START ON 01/05/2023] thiamine  100 mg Oral Daily   ticagrelor  90 mg Oral BID   Or   ticagrelor  90 mg Per Tube BID   Continuous Infusions:  amiodarone 30 mg/hr (01/04/23 0301)   feeding supplement (OSMOLITE 1.5 CAL) 1,000 mL (01/03/23 1300)   vancomycin 1,000 mg (01/03/23 2125)   PRN Meds: docusate sodium, hydrALAZINE, hydrocortisone, hydrocortisone cream, levalbuterol, loratadine, metoprolol tartrate, Muscle Rub, ondansetron (ZOFRAN) IV, mouth rinse, oxyCODONE, polyethylene glycol, polyvinyl alcohol, senna-docusate, sodium chloride   Vital Signs    Vitals:   01/03/23 2235 01/04/23 0000 01/04/23 0340 01/04/23 0800  BP: (!) 128/57 (!) 127/56 (!) 124/54 (!) 146/75  Pulse: 62 62 64 72  Resp: 20 20 (!) 21 (!) 21  Temp: 98.2 F (36.8 C)  98.1 F (36.7 C) 97.7 F (36.5 C)  TempSrc: Axillary  Oral Oral  SpO2: 99% 99% 98% 98%  Weight:   72.3 kg     Intake/Output Summary (Last 24 hours) at 01/04/2023 1136 Last data filed at 01/03/2023 2336 Gross per 24 hour  Intake --  Output 775 ml  Net -775 ml   Filed Weights   01/02/23 0453  01/03/23 0459 01/04/23 0340  Weight: 73.5 kg 71.4 kg 72.3 kg    Telemetry    Nsr with NSVT - Personally Reviewed  ECG    none - Personally Reviewed  Physical Exam   GEN: chronically ill appearing, acute distress.   Neck: No JVD Cardiac: RRR, no murmurs, rubs, or gallops.  Respiratory: Clear to auscultation bilaterally. GI: Soft, nontender, non-distended  MS: No edema; No deformity. Neuro:  Nonfocal  Psych: Normal affect   Labs    Chemistry Recent Labs  Lab 12/29/22 0836 12/29/22 2233 01/01/23 0113 01/02/23 0643 01/03/23 0450  NA 132*   < > 135 135 138  K 3.7   < > 4.5 4.1 4.4  CL 102   < > 111 110 110  CO2 21*   < > 16* 19* 20*  GLUCOSE 97   < > 79 117* 99  BUN 39*   < > 22 23 26*  CREATININE 2.12*   < > 1.63* 1.64* 1.39*  CALCIUM 7.7*   < > 8.6* 8.6* 9.1  PROT 5.1*  --   --   --   --   ALBUMIN 2.8*  --   --   --   --  AST 28  --   --   --   --   ALT 20  --   --   --   --   ALKPHOS 53  --   --   --   --   BILITOT 0.9  --   --   --   --   GFRNONAA 31*   < > 42* 42* 51*  ANIONGAP 9   < > 8 6 8    < > = values in this interval not displayed.     Hematology Recent Labs  Lab 01/02/23 0643 01/03/23 0450 01/04/23 1011  WBC 11.8* 11.4* 9.9  RBC 2.91* 3.11* 2.59*  HGB 8.3* 8.7* 7.5*  HCT 27.1* 29.1* 24.1*  MCV 93.1 93.6 93.1  MCH 28.5 28.0 29.0  MCHC 30.6 29.9* 31.1  RDW 16.1* 16.0* 16.1*  PLT 167 207 230    Cardiac EnzymesNo results for input(s): "TROPONINI" in the last 168 hours. No results for input(s): "TROPIPOC" in the last 168 hours.   BNPNo results for input(s): "BNP", "PROBNP" in the last 168 hours.   DDimer  Recent Labs  Lab 12/30/22 0322  DDIMER 0.72*     Radiology    DG CHEST PORT 1 VIEW  Result Date: 01/04/2023 CLINICAL DATA:  Shortness of breath. EXAM: PORTABLE CHEST 1 VIEW COMPARISON:  01/01/2023 FINDINGS: Patchy bilateral airspace opacities are similar to prior with improvement in aeration at the left base. The cardio  pericardial silhouette is enlarged. Dual lead pacer/AICD again noted. Telemetry leads overlie the chest. Feeding tube tip projects in the region of the distal stomach/pylorus. IMPRESSION: Patchy bilateral airspace disease with improvement in aeration at the left base. Feeding tube tip projects at the level of the distal stomach Electronically Signed   By: Kennith Center M.D.   On: 01/04/2023 10:33   DG Abd Portable 1V  Result Date: 01/03/2023 CLINICAL DATA:  Feeding tube placement. EXAM: PORTABLE ABDOMEN - 1 VIEW COMPARISON:  01/01/2023 FINDINGS: Feeding tube has been placed, tip overlying the level of the mid stomach. Partially imaged chest shows cardiomegaly and pacemaker leads. Bowel gas pattern is nonobstructed. IMPRESSION: Feeding tube tip at the level of the mid stomach. Electronically Signed   By: Norva Pavlov M.D.   On: 01/03/2023 12:05    Cardiac Studies   2D echo reviewed  Patient Profile     82 y.o. male admitted with MRSA bacteremia.  Assessment & Plan    MRSA bacteremia - continue supportive care. He has presumed infection on/in his ICD system. However he is not a candidate for extraction at this time. I think prognosis is very poor. Agree with comfort care measures.   [facility] will sign off.   Medication Recommendations:  long term palliative anti-biotics Other recommendations (labs, testing, etc):  none Follow up as an outpatient:  [facility]  For questions or updates, please contact [facility] Please consult [email or web address] for contact info under Cardiology/STEMI.      Signed, Lewayne Bunting, MD  01/04/2023, 11:36 AM

## 2023-01-05 DIAGNOSIS — R7881 Bacteremia: Secondary | ICD-10-CM

## 2023-01-05 DIAGNOSIS — B9562 Methicillin resistant Staphylococcus aureus infection as the cause of diseases classified elsewhere: Secondary | ICD-10-CM | POA: Diagnosis not present

## 2023-01-05 LAB — GLUCOSE, CAPILLARY
Glucose-Capillary: 103 mg/dL — ABNORMAL HIGH (ref 70–99)
Glucose-Capillary: 115 mg/dL — ABNORMAL HIGH (ref 70–99)
Glucose-Capillary: 121 mg/dL — ABNORMAL HIGH (ref 70–99)
Glucose-Capillary: 132 mg/dL — ABNORMAL HIGH (ref 70–99)
Glucose-Capillary: 135 mg/dL — ABNORMAL HIGH (ref 70–99)
Glucose-Capillary: 148 mg/dL — ABNORMAL HIGH (ref 70–99)

## 2023-01-05 LAB — CULTURE, BLOOD (ROUTINE X 2): Special Requests: ADEQUATE

## 2023-01-05 MED ORDER — OXYCODONE HCL 5 MG PO TABS
5.0000 mg | ORAL_TABLET | ORAL | Status: DC | PRN
  Administered 2023-01-05 – 2023-01-09 (×12): 5 mg via ORAL
  Filled 2023-01-05 (×11): qty 1

## 2023-01-05 MED ORDER — LIDOCAINE 5 % EX PTCH
2.0000 | MEDICATED_PATCH | CUTANEOUS | Status: DC
  Administered 2023-01-05 – 2023-01-16 (×9): 2 via TRANSDERMAL
  Filled 2023-01-05 (×11): qty 2

## 2023-01-05 MED ORDER — IPRATROPIUM-ALBUTEROL 0.5-2.5 (3) MG/3ML IN SOLN
3.0000 mL | Freq: Two times a day (BID) | RESPIRATORY_TRACT | Status: DC
  Administered 2023-01-05 – 2023-01-16 (×18): 3 mL via RESPIRATORY_TRACT
  Filled 2023-01-05 (×20): qty 3

## 2023-01-05 MED ORDER — SODIUM CHLORIDE 0.9 % IV SOLN: INTRAVENOUS | Status: DC | PRN

## 2023-01-05 NOTE — Plan of Care (Signed)
Patient remains on 2C - Cardiac PCU at time of writing. Patient is on infusion of IV Amiodarone and also has IV antibiotics ordered; consult to IV team placed for assistance in obtaining a second PIV site. Patient is receiving enteral nutrition via Cortrak. Patient has declined bed mobility thus far for me tonight, and has little/no documentation of mobility throughout the day today. Patient is scored as "high fall risk"; fall risk bundle implemented by this RN (no intervention previously in place). No current orders for NIHSS assessments.    Problem: Fluid Volume: Goal: Hemodynamic stability will improve Outcome: Not Progressing   Problem: Clinical Measurements: Goal: Diagnostic test results will improve Outcome: Not Progressing Goal: Signs and symptoms of infection will decrease Outcome: Not Progressing   Problem: Respiratory: Goal: Ability to maintain adequate ventilation will improve Outcome: Not Progressing   Problem: Education: Goal: Knowledge of General Education information will improve Description: Including pain rating scale, medication(s)/side effects and non-pharmacologic comfort measures Outcome: Not Progressing   Problem: Health Behavior/Discharge Planning: Goal: Ability to manage health-related needs will improve Outcome: Not Progressing   Problem: Clinical Measurements: Goal: Ability to maintain clinical measurements within normal limits will improve Outcome: Not Progressing Goal: Will remain free from infection Outcome: Not Progressing Goal: Diagnostic test results will improve Outcome: Not Progressing Goal: Respiratory complications will improve Outcome: Not Progressing Goal: Cardiovascular complication will be avoided Outcome: Not Progressing   Problem: Activity: Goal: Risk for activity intolerance will decrease Outcome: Not Progressing   Problem: Nutrition: Goal: Adequate nutrition will be maintained Outcome: Not Progressing   Problem: Coping: Goal:  Level of anxiety will decrease Outcome: Not Progressing   Problem: Elimination: Goal: Will not experience complications related to bowel motility Outcome: Not Progressing Goal: Will not experience complications related to urinary retention Outcome: Not Progressing   Problem: Pain Managment: Goal: General experience of comfort will improve Outcome: Not Progressing   Problem: Safety: Goal: Ability to remain free from injury will improve Outcome: Not Progressing   Problem: Skin Integrity: Goal: Risk for impaired skin integrity will decrease Outcome: Not Progressing   Problem: Education: Goal: Understanding of CV disease, CV risk reduction, and recovery process will improve Outcome: Not Progressing Goal: Individualized Educational Video(s) Outcome: Not Progressing   Problem: Activity: Goal: Ability to return to baseline activity level will improve Outcome: Not Progressing   Problem: Cardiovascular: Goal: Ability to achieve and maintain adequate cardiovascular perfusion will improve Outcome: Not Progressing Goal: Vascular access site(s) Level 0-1 will be maintained Outcome: Not Progressing   Problem: Health Behavior/Discharge Planning: Goal: Ability to safely manage health-related needs after discharge will improve Outcome: Not Progressing   Problem: Education: Goal: Knowledge of disease or condition will improve Outcome: Not Progressing Goal: Knowledge of secondary prevention will improve (MUST DOCUMENT ALL) Outcome: Not Progressing Goal: Knowledge of patient specific risk factors will improve Loraine Leriche N/A or DELETE if not current risk factor) Outcome: Not Progressing   Problem: Ischemic Stroke/TIA Tissue Perfusion: Goal: Complications of ischemic stroke/TIA will be minimized Outcome: Not Progressing   Problem: Coping: Goal: Will verbalize positive feelings about self Outcome: Not Progressing Goal: Will identify appropriate support needs Outcome: Not Progressing    Problem: Health Behavior/Discharge Planning: Goal: Ability to manage health-related needs will improve Outcome: Not Progressing Goal: Goals will be collaboratively established with patient/family Outcome: Not Progressing   Problem: Self-Care: Goal: Ability to participate in self-care as condition permits will improve Outcome: Not Progressing Goal: Verbalization of feelings and concerns over difficulty with self-care will improve Outcome:  Not Progressing Goal: Ability to communicate needs accurately will improve Outcome: Not Progressing   Problem: Nutrition: Goal: Risk of aspiration will decrease Outcome: Not Progressing Goal: Dietary intake will improve Outcome: Not Progressing

## 2023-01-05 NOTE — Plan of Care (Signed)
  Problem: Fluid Volume: Goal: Hemodynamic stability will improve Outcome: Progressing   Problem: Clinical Measurements: Goal: Diagnostic test results will improve Outcome: Progressing Goal: Signs and symptoms of infection will decrease Outcome: Progressing   Problem: Respiratory: Goal: Ability to maintain adequate ventilation will improve Outcome: Progressing   Problem: Education: Goal: Knowledge of General Education information will improve Description: Including pain rating scale, medication(s)/side effects and non-pharmacologic comfort measures Outcome: Progressing   Problem: Health Behavior/Discharge Planning: Goal: Ability to manage health-related needs will improve Outcome: Progressing   Problem: Clinical Measurements: Goal: Ability to maintain clinical measurements within normal limits will improve Outcome: Progressing Goal: Will remain free from infection Outcome: Progressing Goal: Diagnostic test results will improve Outcome: Progressing Goal: Respiratory complications will improve Outcome: Progressing Goal: Cardiovascular complication will be avoided Outcome: Progressing   Problem: Activity: Goal: Risk for activity intolerance will decrease Outcome: Progressing   Problem: Nutrition: Goal: Adequate nutrition will be maintained Outcome: Progressing   Problem: Coping: Goal: Level of anxiety will decrease Outcome: Progressing   Problem: Elimination: Goal: Will not experience complications related to bowel motility Outcome: Progressing Goal: Will not experience complications related to urinary retention Outcome: Progressing   Problem: Pain Managment: Goal: General experience of comfort will improve Outcome: Progressing   Problem: Safety: Goal: Ability to remain free from injury will improve Outcome: Progressing   Problem: Skin Integrity: Goal: Risk for impaired skin integrity will decrease Outcome: Progressing   Problem: Education: Goal:  Understanding of CV disease, CV risk reduction, and recovery process will improve Outcome: Progressing Goal: Individualized Educational Video(s) Outcome: Progressing   Problem: Activity: Goal: Ability to return to baseline activity level will improve Outcome: Progressing   Problem: Cardiovascular: Goal: Ability to achieve and maintain adequate cardiovascular perfusion will improve Outcome: Progressing Goal: Vascular access site(s) Level 0-1 will be maintained Outcome: Progressing   Problem: Health Behavior/Discharge Planning: Goal: Ability to safely manage health-related needs after discharge will improve Outcome: Progressing   Problem: Education: Goal: Knowledge of disease or condition will improve Outcome: Progressing Goal: Knowledge of secondary prevention will improve (MUST DOCUMENT ALL) Outcome: Progressing Goal: Knowledge of patient specific risk factors will improve Loraine Leriche N/A or DELETE if not current risk factor) Outcome: Progressing   Problem: Ischemic Stroke/TIA Tissue Perfusion: Goal: Complications of ischemic stroke/TIA will be minimized Outcome: Progressing   Problem: Coping: Goal: Will verbalize positive feelings about self Outcome: Progressing Goal: Will identify appropriate support needs Outcome: Progressing   Problem: Health Behavior/Discharge Planning: Goal: Ability to manage health-related needs will improve Outcome: Progressing Goal: Goals will be collaboratively established with patient/family Outcome: Progressing   Problem: Self-Care: Goal: Ability to participate in self-care as condition permits will improve Outcome: Progressing Goal: Verbalization of feelings and concerns over difficulty with self-care will improve Outcome: Progressing Goal: Ability to communicate needs accurately will improve Outcome: Progressing   Problem: Nutrition: Goal: Risk of aspiration will decrease Outcome: Progressing Goal: Dietary intake will improve Outcome:  Progressing

## 2023-01-05 NOTE — Progress Notes (Signed)
Palliative Medicine Inpatient Follow Up Note HPI: 82 year old man with myriad of comorobidities including refractory orthostatic hypotension, HFrEF, chronic N/V who presented w/ SOB at [facility] acteremic with MRSA as well has + for rhinovirus. (+( R sided weakness w/ L ICA thrombus transferred to [facility] for emergent thrombectomy.    Palliative care has been asked to get involved in the setting of multiple readmissions, high chronic disease burden, and declined health to further discuss goals of care.   Today's Discussion 01/05/2023  *Please note that this is a verbal dictation therefore any spelling or grammatical errors are due to the "[employer] One" system interpretation.  Chart reviewed inclusive of vital signs, progress notes, laboratory results, and diagnostic images.   I met with Matthew Zamora this morning at bedside. He shares that he remains to have a lot of pain noted towards his right neck area. He states that the medicine he gets helps fairly well. He did not improvement with the muscle relaxer.   Matthew Zamora shares he is not hungry this morning though he will eat some raising bran if it is brought to him. I was able to provide this. He said he will eat this later. We discussed the importance of nutrition and mobility.   Per discuss with Matthew Zamora patients pain has not been well controlled. We talked about increasing oxycodone frequency. We also talked about adding a Lidoderm patch.   Questions and concerns addressed/Palliative Support Provided.   Objective Assessment: Vital Signs Vitals:   01/05/23 0717 01/05/23 0800  BP: 130/63 133/66  Pulse: 68 73  Resp: (!) 22 18  Temp: 97.8 F (36.6 C)   SpO2: 100% 100%    Intake/Output Summary (Last 24 hours) at 01/05/2023 1018 Last data filed at 01/04/2023 1955 Gross per 24 hour  Intake --  Output 500 ml  Net -500 ml   Last Weight  Most recent update: 01/05/2023  3:26 AM    Weight  73.5 kg (162 lb 0.6 oz)            Gen:  Elderly  Caucasian M chronically ill appearing HEENT: Coretrack in place, dry mucous membranes CV: regular rate and  Irregular rhythm  PULM:  On 2LPM [place], breathing is even and nonlabored ABD: soft/nontender  EXT: No edema  Neuro: Alert and oriented x2  SUMMARY OF RECOMMENDATIONS   Full Code / Full Scope of Care    Allow time for outcomes   PT/OT/Speech    Ongoing PMT support  Symptoms:  Neck Pain: - Tylenol 1g TID - Oxycodone 5mg  Q4H PRN - Gabapentin 100mg  PO QHS - Muscle rub PRN - Flexeril 5mg  PO TID - Lidoderm patch to neck - Heat pads PRN  Delirium: -Lights and TV off, minimize interruptions at night -Blinds open and lights on during day -Glasses/hearing aid with patient -Get up during the day -Encourage a familiar face to remain present throughout the day -Frequent reorientation  Weakness: - Mobility with aid - Nursing to mobilize daily  Adult FTT: - Appreciate ongoing nutrition involvement - Coretrack in place - 1:1 feeding - Provide more palatable foods  Billing based on MDM: High ______________________________________________________________________________________ Matthew Zamora Palliative Medicine Team Team Cell Phone: [phone] Please utilize secure chat with additional questions, if there is no response within 30 minutes please call the above phone number  Palliative Medicine Team providers are available by phone from 7am to 7pm daily and can be reached through the team cell phone.  Should this patient require  assistance outside of these hours, please call the patient's attending physician.

## 2023-01-05 NOTE — Progress Notes (Signed)
PROGRESS NOTE    Matthew Zamora  GNF:[id number] DOB: Apr 07, 1941 DOA: 12/29/2022 PCP: Jerl Mina, MD   Brief Narrative:  82 year old with history of GERD, refractory orthostatic hypotension, CHF with reduced EF, chronic nausea and vomiting initially presented to [facility] ED with acute shortness of breath chest pain.  Initial evaluation showed acute CHF complicated by atrial fibrillation with RVR.  Initially hypotensive resuscitated with fluid and started on amiodarone drip.  Patient spiked a fever therefore started on broad-spectrum antibiotics eventually blood cultures grew MRSA and respiratory viral panel was positive for rhinovirus.  On 7/14 patient also suffered from acute right-sided weakness with aphasia.  CT angio showed acute left occlusion ICA.  Neurology and vascular were also consulted and patient was transferred to [facility] for neuroIR eval.  Patient underwent left carotid stent placement and post procedure transferred to the ICU.  01/03/2023: Patient seen.  No significant history from patient.  Available records reviewed.  No new complaints today.  Mild improvement in renal function noted.  Serum creatinine has improved from 1.64-1.39.  Phosphorus is 2.4.  Blood pressure is 151/71 mmHg, will titrate the midodrine downwards to 5 Mg p.o. 3 times daily.  Continue to manage expectantly.  01/04/2023: Patient seen.  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  No significant changes noted when I saw the patient.  Will take ahead and obtain a chest x-ray, cardiac enzymes and ABG.  Will also cycle cardiac enzymes.  IV Lasix 40 mg x 1 dose..   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  Neurology and IR planning to continue Eliquis,  Brilinta.  Continue statin.  PT/OT-SNF   MRSA bacteremia, source from cardiac device Septic shock, resolved Repeat blood cultures still remain positive for MRSA bacteremia.  ID is following, patient remains on IV vancomycin.  CT scan does not show any evidence of cervical/thoracic discitis.  He does have an ICD in place with concerns of vegetation.  Currently no plans for extraction but likely will need TEE Per EP cardiology, not a candidate for extraction due to high risk 01/05/2023: Continue IV vancomycin.   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  Due to soft blood pressure, holding off on introducing any aggressive diuretics at this time.  Continue Lopressor 50 mg twice daily.  Also on midodrine 30 mg every 8 hours 01/04/2023: Give extra IV Lasix 40 Mg x 1 dose.  Follow cardiac BNP, chest x-ray and cardiac enzymes. 01/05/2023: Patient is better today.   Atrial fibrillation with RVR Rate is still elevated, on metoprolol 50 mg twice daily.  IV as needed. Eliquis BID Consult cardiology We may need to restart amiodarone drip 01/05/2023: Heart rate is currently controlled.  Patient is on Eliquis.  Dysphagia with severe nutritional deficiency - Cortrak in place, getting tube feeds.  Monitor for refeeding syndrome.  As we approach goal rate and if patient would like p.o. intake, we can slowly transition to nocturnal tube feeds 01/03/2023: Continue NG tube feeds.  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  No evidence of discitis seen on CT.  CK normal.  Pain control,  physical therapy.  Voltaren gel   Acute on chronic CKD stage IIIb NAGMA Hyponatremia, improving Baseline creatinine around 1.5, admission creatinine 2.12.  Slowly improving at this time.  Monitor urine output. 01/03/2023: Improvement in  serum creatinine noted (1.64-1.39).   Normocytic anemia, likely anemia of chronic disease Hemoglobin stable at 8.3   Thrombocytopenia Platelets had drifted down to 92 now slowly recovering    GERD -PPI   Chronic hypotension On midodrine 30 mg 3 times daily, Mestinon   Depression/anxiety -Continue home sertraline 25 mg daily    Acute metabolic encephalopathy secondary to sepsis, improved Improved  DVT prophylaxis: Eliquis Code Status: Full code Family Communication:   Chronically ill patient with MRSA bacteremia and multiple ongoing issues.  Will be in the hospital for least next 3-5 days       Diet Orders (From admission, onward)     Start     Ordered   12/30/22 1025  Diet regular Room service appropriate? Yes with Assist; Fluid consistency: Thin  Diet effective now       Comments: Meds whole with pudding  Question Answer Comment  Room service appropriate? Yes with Assist   Fluid consistency: Thin      12/30/22 1024            Subjective: Patient seen. Patient looks a lot better today.    Examination: Constitutional: Chronically ill looking.  Not in acute distress.   Respiratory: Decreased air entry. Cardiovascular: S1-S2.   Abdomen: Soft and nontender. Extremities: No leg edema.  Swelling of right upper extremity has improved significantly.[name] Neurologic: Patient is awake.  Right-sided weakness, worse right upper extremity.   Objective: Vitals:   01/05/23 0322 01/05/23 0717 01/05/23 0800 01/05/23 1100  BP: (!) 113/54 130/63 133/66 106/71  Pulse: 60 68 73 68  Resp: 18 (!) 22 18 16   Temp: 97.6 F (36.4 C) 97.8 F (36.6 C)  97.8 F (36.6 C)  TempSrc: Oral Oral  Oral  SpO2: 100% 100% 100% 99%  Weight: 73.5 kg       Intake/Output Summary (Last 24 hours) at 01/05/2023 1527 Last data filed at 01/04/2023 1955 Gross per 24 hour  Intake --  Output 500 ml  Net -500 ml   Filed Weights   01/03/23 0459 01/04/23 0340 01/05/23 0322  Weight: 71.4 kg 72.3  kg 73.5 kg    Scheduled Meds:  acetaminophen  1,000 mg Oral TID   apixaban  5 mg Oral BID   atorvastatin  80 mg Oral QHS   budesonide (PULMICORT) nebulizer solution  0.25 mg Nebulization BID   cyclobenzaprine  5 mg Oral TID   diclofenac Sodium  4 g Topical QID   feeding supplement  237 mL Oral BID BM   feeding supplement (PROSource TF20)  60 mL Per Tube Daily   gabapentin  100 mg Oral QHS   ipratropium-albuterol  3 mL Nebulization BID   lidocaine  2 patch Transdermal Q24H   metoprolol tartrate  75 mg Oral BID   midodrine  5 mg Oral TID WC   multivitamin  1 tablet Oral QHS   pantoprazole  40 mg Oral BID   pyridostigmine  60 mg Oral Q8H   sertraline  25 mg Oral Daily   sucralfate  1 g Oral TID WC & HS   thiamine  100 mg Oral Daily   ticagrelor  90 mg Oral BID   Or   ticagrelor  90 mg Per Tube BID   Continuous Infusions:  amiodarone  30 mg/hr (01/05/23 1352)   feeding supplement (OSMOLITE 1.5 CAL) 1,000 mL (01/04/23 1358)   vancomycin 1,000 mg (01/04/23 2109)    Nutritional status Signs/Symptoms: severe fat depletion, severe muscle depletion Interventions: Tube feeding, Ensure Enlive (each supplement provides 350kcal and 20 grams of protein), MVI, Refer to RD note for recommendations Body mass index is 24.64 kg/m.  Data Reviewed:   CBC: Recent Labs  Lab 12/31/22 0519 01/01/23 0113 01/02/23 0643 01/03/23 0450 01/04/23 1011  WBC 9.4 11.3* 11.8* 11.4* 9.9  NEUTROABS  --   --   --   --  8.5*  HGB 7.3* 8.1* 8.3* 8.7* 7.5*  HCT 23.2* 26.7* 27.1* 29.1* 24.1*  MCV 91.7 96.7 93.1 93.6 93.1  PLT 101* 120* 167 207 230   Basic Metabolic Panel: Recent Labs  Lab 12/30/22 0322 12/31/22 0519 01/01/23 0113 01/01/23 1734 01/02/23 0643 01/02/23 1734 01/03/23 0450 01/04/23 1011  NA 132* 134* 135  --  135  --  138 137  K 3.8 3.7 4.5  --  4.1  --  4.4 4.2  CL 106 109 111  --  110  --  110 103  CO2 18* 18* 16*  --  19*  --  20* 19*  GLUCOSE 121* 81 79  --  117*  --  99  146*  BUN 31* 24* 22  --  23  --  26* 31*  CREATININE 1.73* 1.75* 1.63*  --  1.64*  --  1.39* 1.45*  CALCIUM 7.9* 8.4* 8.6*  --  8.6*  --  9.1 8.7*  MG 2.1 2.1 2.1 2.0 2.0 1.8 2.0 1.9  PHOS 2.9  --   --  2.6 2.3* 2.1* 2.4*  --    GFR: Estimated Creatinine Clearance: 39.3 mL/min (A) (by C-G formula based on SCr of 1.45 mg/dL (H)). Liver Function Tests: No results for input(s): "AST", "ALT", "ALKPHOS", "BILITOT", "PROT", "ALBUMIN" in the last 168 hours.  No results for input(s): "LIPASE", "AMYLASE" in the last 168 hours. No results for input(s): "AMMONIA" in the last 168 hours. Coagulation Profile: Recent Labs  Lab 12/30/22 0322  INR 1.4*   Cardiac Enzymes: Recent Labs  Lab 12/31/22 0924  CKTOTAL 92   BNP (last 3 results) No results for input(s): "PROBNP" in the last 8760 hours. HbA1C: No results for input(s): "HGBA1C" in the last 72 hours. CBG: Recent Labs  Lab 01/04/23 1600 01/04/23 1958 01/05/23 0013 01/05/23 0715 01/05/23 1131  GLUCAP 134* 134* 148* 103* 132*   Lipid Profile: No results for input(s): "CHOL", "HDL", "LDLCALC", "TRIG", "CHOLHDL", "LDLDIRECT" in the last 72 hours. Thyroid Function Tests: No results for input(s): "TSH", "T4TOTAL", "FREET4", "T3FREE", "THYROIDAB" in the last 72 hours. Anemia Panel: No results for input(s): "VITAMINB12", "FOLATE", "FERRITIN", "TIBC", "IRON", "RETICCTPCT" in the last 72 hours.  Sepsis Labs: No results for input(s): "PROCALCITON", "LATICACIDVEN" in the last 168 hours.   Recent Results (from the past 240 hour(s))  MRSA Next Gen by PCR, Nasal     Status: Abnormal   Collection Time: 12/27/22  9:38 PM   Specimen: Nasal Mucosa; Nasal Swab  Result Value Ref Range Status   MRSA by PCR Next Gen DETECTED (A) NOT DETECTED Final    Comment: CRITICAL RESULT CALLED TO, READ BACK BY AND VERIFIED WITH: MELISSA COBB RN @ [phone] 12/28/22 [facility] (NOTE) The GeneXpert MRSA Assay (FDA approved for NASAL specimens only), is one component  of a comprehensive MRSA colonization surveillance program. It is not intended to diagnose MRSA infection  nor to guide or monitor treatment for MRSA infections. Test performance is not FDA approved in patients less than 4 years old. Performed at [facility], [address] [place], [place] [address]   Culture, blood (Routine X 2) w Reflex to ID Panel     Status: Abnormal   Collection Time: 12/28/22 10:47 AM   Specimen: BLOOD RIGHT HAND  Result Value Ref Range Status   Specimen Description   Final    BLOOD RIGHT HAND Performed at [facility], [address], [place], [place] [address]    Special Requests   Final    BOTTLES DRAWN AEROBIC AND ANAEROBIC Blood Culture adequate volume Performed at [facility], [address] [place], [place] [address]    Culture  Setup Time   Final    GRAM POSITIVE COCCI IN BOTH AEROBIC AND ANAEROBIC BOTTLES CRITICAL RESULT CALLED TO, READ BACK BY AND VERIFIED WITH: TREY GREENWOOD @ 2146 12/28/22 LFD Performed at [facility], [address] [place], [place] [address]    Culture (A)  Final    STAPHYLOCOCCUS AUREUS SUSCEPTIBILITIES PERFORMED ON PREVIOUS CULTURE WITHIN THE LAST 5 DAYS. Performed at [facility], [address], [place], [place] [address]    Report Status 12/31/2022 FINAL  Final  Culture, blood (Routine X 2) w Reflex to ID Panel     Status: Abnormal   Collection Time: 12/28/22 10:48 AM   Specimen: BLOOD LEFT HAND  Result Value Ref Range Status   Specimen Description   Final    BLOOD LEFT HAND Performed at [facility], [address] [place], [place] [address]    Special Requests   Final    BOTTLES DRAWN AEROBIC AND ANAEROBIC Blood Culture adequate volume Performed at [facility] [address] [place], [place] [address]    Culture  Setup Time   Final    GRAM POSITIVE COCCI IN BOTH AEROBIC AND ANAEROBIC BOTTLES Organism ID to follow CRITICAL RESULT  CALLED TO, READ BACK BY AND VERIFIED WITH: TREY GREENWOOD @2146  12/28/22 LFD Performed at [facility], [address] [place], [place] [address]    Culture METHICILLIN RESISTANT STAPHYLOCOCCUS AUREUS (A)  Final   Report Status 12/31/2022 FINAL  Final   Organism ID, Bacteria METHICILLIN RESISTANT STAPHYLOCOCCUS AUREUS  Final      Susceptibility   Methicillin resistant staphylococcus aureus - MIC*    CIPROFLOXACIN >=8 RESISTANT Resistant     ERYTHROMYCIN >=8 RESISTANT Resistant     GENTAMICIN <=0.5 SENSITIVE Sensitive     OXACILLIN >=4 RESISTANT Resistant     TETRACYCLINE <=1 SENSITIVE Sensitive     VANCOMYCIN <=0.5 SENSITIVE Sensitive     TRIMETH/SULFA >=320 RESISTANT Resistant     CLINDAMYCIN <=0.25 SENSITIVE Sensitive     RIFAMPIN <=0.5 SENSITIVE Sensitive     Inducible Clindamycin NEGATIVE Sensitive     LINEZOLID 2 SENSITIVE Sensitive     * METHICILLIN RESISTANT STAPHYLOCOCCUS AUREUS  Blood Culture ID Panel (Reflexed)     Status: Abnormal   Collection Time: 12/28/22 10:48 AM  Result Value Ref Range Status   Enterococcus faecalis NOT DETECTED NOT DETECTED Final   Enterococcus Faecium NOT DETECTED NOT DETECTED Final   Listeria monocytogenes NOT DETECTED NOT DETECTED Final   Staphylococcus species DETECTED (A) NOT DETECTED Final    Comment: CRITICAL RESULT CALLED TO, READ BACK BY AND VERIFIED WITH: TREY GREENWOOD @ 2146 12/28/22 LFD    Staphylococcus aureus (BCID) DETECTED (A) NOT DETECTED  Final    Comment: Methicillin (oxacillin)-resistant Staphylococcus aureus (MRSA). MRSA is predictably resistant to beta-lactam antibiotics (except ceftaroline). Preferred therapy is vancomycin unless clinically contraindicated. Patient requires contact precautions if  hospitalized. CRITICAL RESULT CALLED TO, READ BACK BY AND VERIFIED WITH: TREY GREENWOOD @ 2146 12/28/22 LFD    Staphylococcus epidermidis NOT DETECTED NOT DETECTED Final   Staphylococcus lugdunensis NOT DETECTED NOT  DETECTED Final   Streptococcus species NOT DETECTED NOT DETECTED Final   Streptococcus agalactiae NOT DETECTED NOT DETECTED Final   Streptococcus pneumoniae NOT DETECTED NOT DETECTED Final   Streptococcus pyogenes NOT DETECTED NOT DETECTED Final   A.calcoaceticus-baumannii NOT DETECTED NOT DETECTED Final   Bacteroides fragilis NOT DETECTED NOT DETECTED Final   Enterobacterales NOT DETECTED NOT DETECTED Final   Enterobacter cloacae complex NOT DETECTED NOT DETECTED Final   Escherichia coli NOT DETECTED NOT DETECTED Final   Klebsiella aerogenes NOT DETECTED NOT DETECTED Final   Klebsiella oxytoca NOT DETECTED NOT DETECTED Final   Klebsiella pneumoniae NOT DETECTED NOT DETECTED Final   Proteus species NOT DETECTED NOT DETECTED Final   Salmonella species NOT DETECTED NOT DETECTED Final   Serratia marcescens NOT DETECTED NOT DETECTED Final   Haemophilus influenzae NOT DETECTED NOT DETECTED Final   Neisseria meningitidis NOT DETECTED NOT DETECTED Final   Pseudomonas aeruginosa NOT DETECTED NOT DETECTED Final   Stenotrophomonas maltophilia NOT DETECTED NOT DETECTED Final   Candida albicans NOT DETECTED NOT DETECTED Final   Candida auris NOT DETECTED NOT DETECTED Final   Candida glabrata NOT DETECTED NOT DETECTED Final   Candida krusei NOT DETECTED NOT DETECTED Final   Candida parapsilosis NOT DETECTED NOT DETECTED Final   Candida tropicalis NOT DETECTED NOT DETECTED Final   Cryptococcus neoformans/gattii NOT DETECTED NOT DETECTED Final   Meth resistant mecA/C and MREJ DETECTED (A) NOT DETECTED Final    Comment: CRITICAL RESULT CALLED TO, READ BACK BY AND VERIFIED WITH: TREY GREENWOOD @ 2146 12/28/22 LFD Performed at [facility], [address] [place], [place] [address]   Respiratory (~20 pathogens) panel by PCR     Status: None   Collection Time: 12/28/22 11:55 AM   Specimen: Nasopharyngeal Swab; Respiratory  Result Value Ref Range Status   Adenovirus NOT DETECTED NOT  DETECTED Final   Coronavirus 229E NOT DETECTED NOT DETECTED Final    Comment: (NOTE) The Coronavirus on the Respiratory Panel, DOES NOT test for the novel  Coronavirus (2019 nCoV)    Coronavirus HKU1 NOT DETECTED NOT DETECTED Final   Coronavirus NL63 NOT DETECTED NOT DETECTED Final   Coronavirus OC43 NOT DETECTED NOT DETECTED Final   Metapneumovirus NOT DETECTED NOT DETECTED Final   Rhinovirus / Enterovirus NOT DETECTED NOT DETECTED Final   Influenza A NOT DETECTED NOT DETECTED Final   Influenza B NOT DETECTED NOT DETECTED Final   Parainfluenza Virus 1 NOT DETECTED NOT DETECTED Final   Parainfluenza Virus 2 NOT DETECTED NOT DETECTED Final   Parainfluenza Virus 3 NOT DETECTED NOT DETECTED Final   Parainfluenza Virus 4 NOT DETECTED NOT DETECTED Final   Respiratory Syncytial Virus NOT DETECTED NOT DETECTED Final   Bordetella pertussis NOT DETECTED NOT DETECTED Final   Bordetella Parapertussis NOT DETECTED NOT DETECTED Final   Chlamydophila pneumoniae NOT DETECTED NOT DETECTED Final   Mycoplasma pneumoniae NOT DETECTED NOT DETECTED Final    Comment: Performed at [facility], [address] [place], [place] [address]  SARS Coronavirus 2 by RT PCR (hospital order, performed in [facility] hospital lab) *  cepheid single result test* Anterior Nasal Swab     Status: None   Collection Time: 12/29/22  1:06 PM   Specimen: Anterior Nasal Swab  Result Value Ref Range Status   SARS Coronavirus 2 by RT PCR NEGATIVE NEGATIVE Final    Comment: Performed at [facility], [address] [place], [place] [address]  MRSA Next Gen by PCR, Nasal     Status: Abnormal   Collection Time: 12/29/22  6:50 PM   Specimen: Nasal Mucosa; Nasal Swab  Result Value Ref Range Status   MRSA by PCR Next Gen DETECTED (A) NOT DETECTED Final    Comment: RESULT CALLED TO, READ BACK BY AND VERIFIED WITH: S. SAVAGE  RN 12/29/22 @ 2126 BY AB (NOTE) The GeneXpert MRSA Assay (FDA approved for NASAL specimens  only), is one component of a comprehensive MRSA colonization surveillance program. It is not intended to diagnose MRSA infection nor to guide or monitor treatment for MRSA infections. Test performance is not FDA approved in patients less than 78 years old. Performed at [facility], [address] [place], [place] [address]   Culture, blood (Routine X 2) w Reflex to ID Panel     Status: Abnormal   Collection Time: 12/31/22  7:37 AM   Specimen: BLOOD RIGHT HAND  Result Value Ref Range Status   Specimen Description BLOOD RIGHT HAND  Final   Special Requests   Final    BOTTLES DRAWN AEROBIC AND ANAEROBIC Blood Culture adequate volume   Culture  Setup Time   Final    GRAM POSITIVE COCCI IN CLUSTERS ANAEROBIC BOTTLE ONLY CRITICAL RESULT CALLED TO, READ BACK BY AND VERIFIED WITH: PHARMD J. LEDFORD 01/01/23 @ 0330 BY AB Performed at [facility], [address], [place], [place] [address]    Culture METHICILLIN RESISTANT STAPHYLOCOCCUS AUREUS (A)  Final   Report Status 01/03/2023 FINAL  Final   Organism ID, Bacteria METHICILLIN RESISTANT STAPHYLOCOCCUS AUREUS  Final      Susceptibility   Methicillin resistant staphylococcus aureus - MIC*    CIPROFLOXACIN >=8 RESISTANT Resistant     ERYTHROMYCIN >=8 RESISTANT Resistant     GENTAMICIN <=0.5 SENSITIVE Sensitive     OXACILLIN >=4 RESISTANT Resistant     TETRACYCLINE <=1 SENSITIVE Sensitive     VANCOMYCIN 1 SENSITIVE Sensitive     TRIMETH/SULFA >=320 RESISTANT Resistant     CLINDAMYCIN <=0.25 SENSITIVE Sensitive     RIFAMPIN <=0.5 SENSITIVE Sensitive     Inducible Clindamycin NEGATIVE Sensitive     LINEZOLID 2 SENSITIVE Sensitive     * METHICILLIN RESISTANT STAPHYLOCOCCUS AUREUS  Blood Culture ID Panel (Reflexed)     Status: Abnormal   Collection Time: 12/31/22  7:37 AM  Result Value Ref Range Status   Enterococcus faecalis NOT DETECTED NOT DETECTED Final   Enterococcus Faecium NOT DETECTED NOT DETECTED Final   Listeria  monocytogenes NOT DETECTED NOT DETECTED Final   Staphylococcus species DETECTED (A) NOT DETECTED Final    Comment: CRITICAL RESULT CALLED TO, READ BACK BY AND VERIFIED WITH: PHARMD J. LEDFORD 01/01/23 @ 0330 BY AB    Staphylococcus aureus (BCID) DETECTED (A) NOT DETECTED Final    Comment: Methicillin (oxacillin)-resistant Staphylococcus aureus (MRSA). MRSA is predictably resistant to beta-lactam antibiotics (except ceftaroline). Preferred therapy is vancomycin unless clinically contraindicated. Patient requires contact precautions if  hospitalized. CRITICAL RESULT CALLED TO, READ BACK BY AND VERIFIED WITH: PHARMD J. LEDFORD 01/01/23 @ 0330 BY AB  Staphylococcus epidermidis NOT DETECTED NOT DETECTED Final   Staphylococcus lugdunensis NOT DETECTED NOT DETECTED Final   Streptococcus species NOT DETECTED NOT DETECTED Final   Streptococcus agalactiae NOT DETECTED NOT DETECTED Final   Streptococcus pneumoniae NOT DETECTED NOT DETECTED Final   Streptococcus pyogenes NOT DETECTED NOT DETECTED Final   A.calcoaceticus-baumannii NOT DETECTED NOT DETECTED Final   Bacteroides fragilis NOT DETECTED NOT DETECTED Final   Enterobacterales NOT DETECTED NOT DETECTED Final   Enterobacter cloacae complex NOT DETECTED NOT DETECTED Final   Escherichia coli NOT DETECTED NOT DETECTED Final   Klebsiella aerogenes NOT DETECTED NOT DETECTED Final   Klebsiella oxytoca NOT DETECTED NOT DETECTED Final   Klebsiella pneumoniae NOT DETECTED NOT DETECTED Final   Proteus species NOT DETECTED NOT DETECTED Final   Salmonella species NOT DETECTED NOT DETECTED Final   Serratia marcescens NOT DETECTED NOT DETECTED Final   Haemophilus influenzae NOT DETECTED NOT DETECTED Final   Neisseria meningitidis NOT DETECTED NOT DETECTED Final   Pseudomonas aeruginosa NOT DETECTED NOT DETECTED Final   Stenotrophomonas maltophilia NOT DETECTED NOT DETECTED Final   Candida albicans NOT DETECTED NOT DETECTED Final   Candida auris NOT  DETECTED NOT DETECTED Final   Candida glabrata NOT DETECTED NOT DETECTED Final   Candida krusei NOT DETECTED NOT DETECTED Final   Candida parapsilosis NOT DETECTED NOT DETECTED Final   Candida tropicalis NOT DETECTED NOT DETECTED Final   Cryptococcus neoformans/gattii NOT DETECTED NOT DETECTED Final   Meth resistant mecA/C and MREJ DETECTED (A) NOT DETECTED Final    Comment: CRITICAL RESULT CALLED TO, READ BACK BY AND VERIFIED WITH: PHARMD J. LEDFORD 01/01/23 @ 0330 BY AB Performed at [facility], [address] [place], [place] [address]   Culture, blood (Routine X 2) w Reflex to ID Panel     Status: None   Collection Time: 12/31/22  7:43 AM   Specimen: BLOOD RIGHT HAND  Result Value Ref Range Status   Specimen Description BLOOD RIGHT HAND  Final   Special Requests   Final    BOTTLES DRAWN AEROBIC ONLY Blood Culture adequate volume   Culture   Final    NO GROWTH 5 DAYS Performed at [facility], [address], [place], [place] [address]    Report Status 01/05/2023 FINAL  Final  Culture, blood (Routine X 2) w Reflex to ID Panel     Status: None (Preliminary result)   Collection Time: 01/02/23  3:51 PM   Specimen: BLOOD LEFT HAND  Result Value Ref Range Status   Specimen Description BLOOD LEFT HAND  Final   Special Requests   Final    BOTTLES DRAWN AEROBIC AND ANAEROBIC Blood Culture adequate volume   Culture   Final    NO GROWTH 3 DAYS Performed at [facility], [address], [place], [place] [address]    Report Status PENDING  Incomplete  Culture, blood (Routine X 2) w Reflex to ID Panel     Status: None (Preliminary result)   Collection Time: 01/02/23  3:51 PM   Specimen: BLOOD RIGHT HAND  Result Value Ref Range Status   Specimen Description BLOOD RIGHT HAND  Final   Special Requests   Final    BOTTLES DRAWN AEROBIC ONLY Blood Culture results may not be optimal due to an inadequate volume of blood received in culture bottles   Culture   Final    NO  GROWTH 3 DAYS Performed at [facility], [address] [place], [place] [address]    Report Status PENDING  Incomplete         Radiology Studies: DG CHEST PORT 1 VIEW  Result Date: 01/04/2023 CLINICAL DATA:  Shortness of breath. EXAM: PORTABLE CHEST 1 VIEW COMPARISON:  01/01/2023 FINDINGS: Patchy bilateral airspace opacities are similar to prior with improvement in aeration at the left base. The cardio pericardial silhouette is enlarged. Dual lead pacer/AICD again noted. Telemetry leads overlie the chest. Feeding tube tip projects in the region of the distal stomach/pylorus. IMPRESSION: Patchy bilateral airspace disease with improvement in aeration at the left base. Feeding tube tip projects at the level of the distal stomach Electronically Signed   By: Kennith Center M.D.   On: 01/04/2023 10:33           LOS: 7 days   Time spent= 35 mins    Barnetta Chapel, MD [facility]  If 7PM-7AM, please contact night-coverage  01/05/2023, 3:27 PM \

## 2023-01-06 DIAGNOSIS — B9562 Methicillin resistant Staphylococcus aureus infection as the cause of diseases classified elsewhere: Secondary | ICD-10-CM | POA: Diagnosis not present

## 2023-01-06 DIAGNOSIS — R7881 Bacteremia: Secondary | ICD-10-CM | POA: Diagnosis not present

## 2023-01-06 LAB — CULTURE, BLOOD (ROUTINE X 2)
Culture: NO GROWTH
Special Requests: ADEQUATE

## 2023-01-06 LAB — COMPREHENSIVE METABOLIC PANEL
ALT: 39 U/L (ref 0–44)
AST: 30 U/L (ref 15–41)
Albumin: 2.1 g/dL — ABNORMAL LOW (ref 3.5–5.0)
Alkaline Phosphatase: 75 U/L (ref 38–126)
Anion gap: 6 (ref 5–15)
BUN: 48 mg/dL — ABNORMAL HIGH (ref 8–23)
CO2: 23 mmol/L (ref 22–32)
Calcium: 8.5 mg/dL — ABNORMAL LOW (ref 8.9–10.3)
Chloride: 102 mmol/L (ref 98–111)
Creatinine, Ser: 1.67 mg/dL — ABNORMAL HIGH (ref 0.61–1.24)
GFR, Estimated: 41 mL/min — ABNORMAL LOW (ref >60)
Glucose, Bld: 152 mg/dL — ABNORMAL HIGH (ref 70–99)
Potassium: 4.8 mmol/L (ref 3.5–5.1)
Sodium: 131 mmol/L — ABNORMAL LOW (ref 135–145)
Total Bilirubin: 0.4 mg/dL (ref 0.3–1.2)
Total Protein: 5.3 g/dL — ABNORMAL LOW (ref 6.5–8.1)

## 2023-01-06 LAB — GLUCOSE, CAPILLARY
Glucose-Capillary: 122 mg/dL — ABNORMAL HIGH (ref 70–99)
Glucose-Capillary: 123 mg/dL — ABNORMAL HIGH (ref 70–99)
Glucose-Capillary: 136 mg/dL — ABNORMAL HIGH (ref 70–99)
Glucose-Capillary: 142 mg/dL — ABNORMAL HIGH (ref 70–99)
Glucose-Capillary: 145 mg/dL — ABNORMAL HIGH (ref 70–99)
Glucose-Capillary: 165 mg/dL — ABNORMAL HIGH (ref 70–99)

## 2023-01-06 MED ORDER — AMIODARONE HCL 200 MG PO TABS
400.0000 mg | ORAL_TABLET | Freq: Two times a day (BID) | ORAL | Status: DC
  Administered 2023-01-06 – 2023-01-12 (×14): 400 mg via ORAL
  Filled 2023-01-06 (×14): qty 2

## 2023-01-06 MED ORDER — MEGESTROL ACETATE 400 MG/10ML PO SUSP
400.0000 mg | Freq: Two times a day (BID) | ORAL | Status: DC
  Administered 2023-01-06 – 2023-01-15 (×16): 400 mg via ORAL
  Filled 2023-01-06 (×23): qty 10

## 2023-01-06 MED ORDER — OSMOLITE 1.5 CAL PO LIQD
1000.0000 mL | ORAL | Status: DC
  Administered 2023-01-06 – 2023-01-09 (×6): 1000 mL
  Filled 2023-01-06 (×2): qty 1000

## 2023-01-06 NOTE — Progress Notes (Signed)
Heart Failure Navigator Progress Note  Assessed for Heart & Vascular TOC clinic readiness.  Patient does not meet criteria due to chronic Heart failure, follows with [facility], palliative care following. .   Navigator will sign off at thsi time.   Rhae Hammock, BSN, Scientist, clinical (histocompatibility and immunogenetics) Only

## 2023-01-06 NOTE — Plan of Care (Signed)
  Problem: Elimination: Goal: Will not experience complications related to bowel motility Outcome: Progressing   Problem: Ischemic Stroke/TIA Tissue Perfusion: Goal: Complications of ischemic stroke/TIA will be minimized Outcome: Progressing   Problem: Coping: Goal: Will verbalize positive feelings about self 01/06/2023 1621 by Ileana Ladd, RN Outcome: Progressing 01/06/2023 1505 by Ileana Ladd, RN Outcome: Progressing Goal: Will identify appropriate support needs Outcome: Progressing

## 2023-01-06 NOTE — Progress Notes (Signed)
PROGRESS NOTE    Matthew Zamora  JXB:[id number] DOB: 11-Sep-1940 DOA: 12/29/2022 PCP: Jerl Mina, MD   Brief Narrative:  Patient is an 82 year old male with past medical history significant for refractory orthostatic hypotension, CHF with reduced EF, chronic nausea and vomiting.  Patient initially presented to [facility] ED with acute shortness of breath chest pain.  Initial evaluation showed acute CHF complicated by atrial fibrillation with RVR.  Initially hypotensive, resuscitated with fluid and started on amiodarone drip.  Patient spiked a fever patient was started on broad-spectrum antibiotics.  Blood cultures eventually grew MRSA and respiratory viral panel was positive for rhinovirus.  On 7/14 patient also suffered from acute right-sided weakness with aphasia.  CT angio showed acute occlusion of left ICA.  Neurology and vascular were consulted and patient was transferred to [facility] for neuroIR eval.  Patient underwent left carotid stent placement and post procedure transferred to the ICU.  Patient has been on IV vancomycin since 12/28/2022.  Infectious disease team was consulted to direct antibiotics management.  Patient has not been able to undergo TEE.  The plan is to continue IV vancomycin for 6 weeks and then doxycycline for life (i.e. depending on goals of care).  Alternatively, and also depending on goals of care, patient can be changed to Zyvox for 2 weeks and then doxycycline for life.  Patient has an AICD in situ.  For now, due to inability to perform TEE to date, it is assumed that the AICD/ICD leads are infected, unless TEE is done to rule out above.  01/06/2023: Patient seen.  No new complaints.  No fever or chills.  Patient continues to improve slowly.  Prognosis is guarded.  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 -Neurology and IR planning to continue Eliquis, Brilinta.   -Continue statin.  PT/OT-SNF  MRSA bacteremia, source from cardiac device: Septic shock, resolved: -Repeat cultures have been negative so far. -See above documentation regarding duration of IV vancomycin versus Zyvox.   -Input from ID team is highly appreciated.   -Palliative care team's input is also highly appreciated.   -If TEE is not performed, completed 6 weeks of IV vancomycin, then vancomycin for life (i.e. depending on goals of care).  Zyvox is an option as documented above.  -CT scan does not show any evidence of cervical/thoracic discitis.   -Patient has an ICD in place with concerns of vegetation.  Currently no plans for extraction but likely will need TEE Per EP cardiology, not a candidate for extraction due to high risk 01/06/2023: Continue IV vancomycin.   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 -Continue to optimize.   Atrial fibrillation with RVR Rate is still elevated, on metoprolol 50 mg twice daily.  IV as needed. Eliquis BID Consult cardiology We may need to restart amiodarone drip 01/05/2023: Heart rate is currently controlled.  Patient is on Eliquis.  Dysphagia with severe nutritional deficiency - Cortrak in place, getting tube feeds.  Monitor for refeeding syndrome.  As we approach goal rate and if patient would like p.o. intake, we can slowly transition to nocturnal tube feeds 01/03/2023: Continue NG tube feeds.  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  No  evidence of discitis seen on CT.  CK normal.  Pain control, physical therapy.  Voltaren gel   Acute on chronic CKD stage IIIb NAGMA Hyponatremia, improving Baseline creatinine around 1.5, admission creatinine 2.12.  Slowly improving at this  time.  Monitor urine output. 01/03/2023: Improvement in serum creatinine noted (1.64-1.39).   Normocytic anemia, likely anemia of chronic disease Hemoglobin stable at 8.3   Thrombocytopenia Platelets had drifted down to 92 now slowly recovering    GERD -PPI   Chronic hypotension On midodrine 30 mg 3 times daily, Mestinon   Depression/anxiety -Continue home sertraline 25 mg daily    Acute metabolic encephalopathy secondary to sepsis, improved Improved  DVT prophylaxis: Eliquis Code Status: Full code Family Communication:   Chronically ill patient with MRSA bacteremia and multiple ongoing issues.  Will be in the hospital for least next 3-5 days   Pressure Injury 11/05/22 Buttocks Bilateral Stage 2 -  Partial thickness loss of dermis presenting as a shallow open injury with a red, pink wound bed without slough. (Active)  11/05/22 1715  Location: Buttocks  Location Orientation: Bilateral  Staging: Stage 2 -  Partial thickness loss of dermis presenting as a shallow open injury with a red, pink wound bed without slough.  Wound Description (Comments):   Present on Admission:      Pressure Injury 01/05/23 Lip Left Stage 1 -  Intact skin with non-blanchable redness of a localized area usually over a bony prominence. Stage 1 MDRI (Active)  01/05/23 2000  Location: Lip  Location Orientation: Left  Staging: Stage 1 -  Intact skin with non-blanchable redness of a localized area usually over a bony prominence.  Wound Description (Comments): Stage 1 MDRI  Present on Admission: No     Diet Orders (From admission, onward)     Start     Ordered   12/30/22 1025  Diet regular Room service appropriate? Yes with Assist; Fluid consistency: Thin  Diet effective now       Comments: Meds whole with pudding  Question Answer Comment  Room service appropriate? Yes with Assist   Fluid consistency: Thin      12/30/22 1024            Subjective: Patient seen. Patient looks a lot better  today.    Examination: Constitutional: Chronically ill looking.  Not in acute distress.   Respiratory: Decreased air entry. Cardiovascular: S1-S2.   Abdomen: Soft and nontender. Extremities: No leg edema.  Swelling of right upper extremity has improved significantly.[name] Neurologic: Patient is awake.  Right-sided weakness, worse right upper extremity.   Objective: Vitals:   01/06/23 0600 01/06/23 0826 01/06/23 0828 01/06/23 1046  BP: (!) 106/55   108/67  Pulse: 60   71  Resp: 14   18  Temp:    98.9 F (37.2 C)  TempSrc:    Oral  SpO2: 100% 99% 100% 100%  Weight:        Intake/Output Summary (Last 24 hours) at 01/06/2023 1622 Last data filed at 01/06/2023 0800 Gross per 24 hour  Intake 4826.27 ml  Output 800 ml  Net 4026.27 ml   Filed Weights   01/04/23 0340 01/05/23 0322 01/06/23 0329  Weight: 72.3 kg 73.5 kg 74.1 kg    Scheduled Meds:  acetaminophen  1,000 mg Oral TID   amiodarone  400 mg Oral BID   apixaban  5 mg Oral BID   atorvastatin  80 mg Oral QHS   budesonide (PULMICORT) nebulizer solution  0.25 mg  Nebulization BID   diclofenac Sodium  4 g Topical QID   feeding supplement  237 mL Oral BID BM   feeding supplement (OSMOLITE 1.5 CAL)  1,000 mL Per Tube Q24H   feeding supplement (PROSource TF20)  60 mL Per Tube Daily   gabapentin  100 mg Oral QHS   ipratropium-albuterol  3 mL Nebulization BID   lidocaine  2 patch Transdermal Q24H   megestrol  400 mg Oral BID   metoprolol tartrate  75 mg Oral BID   midodrine  5 mg Oral TID WC   multivitamin  1 tablet Oral QHS   pantoprazole  40 mg Oral BID   pyridostigmine  60 mg Oral Q8H   sertraline  25 mg Oral Daily   sucralfate  1 g Oral TID WC & HS   ticagrelor  90 mg Oral BID   Or   ticagrelor  90 mg Per Tube BID   Continuous Infusions:  sodium chloride Stopped (01/06/23 0517)   vancomycin Stopped (01/05/23 2259)    Nutritional status Signs/Symptoms: severe fat depletion, severe muscle depletion Interventions:  Tube feeding, Ensure Enlive (each supplement provides 350kcal and 20 grams of protein), MVI, Refer to RD note for recommendations Body mass index is 24.84 kg/m.  Data Reviewed:   CBC: Recent Labs  Lab 12/31/22 0519 01/01/23 0113 01/02/23 0643 01/03/23 0450 01/04/23 1011  WBC 9.4 11.3* 11.8* 11.4* 9.9  NEUTROABS  --   --   --   --  8.5*  HGB 7.3* 8.1* 8.3* 8.7* 7.5*  HCT 23.2* 26.7* 27.1* 29.1* 24.1*  MCV 91.7 96.7 93.1 93.6 93.1  PLT 101* 120* 167 207 230   Basic Metabolic Panel: Recent Labs  Lab 01/01/23 0113 01/01/23 1734 01/02/23 0643 01/02/23 1734 01/03/23 0450 01/04/23 1011 01/06/23 0817  NA 135  --  135  --  138 137 131*  K 4.5  --  4.1  --  4.4 4.2 4.8  CL 111  --  110  --  110 103 102  CO2 16*  --  19*  --  20* 19* 23  GLUCOSE 79  --  117*  --  99 146* 152*  BUN 22  --  23  --  26* 31* 48*  CREATININE 1.63*  --  1.64*  --  1.39* 1.45* 1.67*  CALCIUM 8.6*  --  8.6*  --  9.1 8.7* 8.5*  MG 2.1 2.0 2.0 1.8 2.0 1.9  --   PHOS  --  2.6 2.3* 2.1* 2.4*  --   --    GFR: Estimated Creatinine Clearance: 34.1 mL/min (A) (by C-G formula based on SCr of 1.67 mg/dL (H)). Liver Function Tests: Recent Labs  Lab 01/06/23 0817  AST 30  ALT 39  ALKPHOS 75  BILITOT 0.4  PROT 5.3*  ALBUMIN 2.1*    No results for input(s): "LIPASE", "AMYLASE" in the last 168 hours. No results for input(s): "AMMONIA" in the last 168 hours. Coagulation Profile: No results for input(s): "INR", "PROTIME" in the last 168 hours.  Cardiac Enzymes: Recent Labs  Lab 12/31/22 0924  CKTOTAL 92   BNP (last 3 results) No results for input(s): "PROBNP" in the last 8760 hours. HbA1C: No results for input(s): "HGBA1C" in the last 72 hours. CBG: Recent Labs  Lab 01/05/23 2325 01/06/23 0330 01/06/23 0809 01/06/23 1211 01/06/23 1604  GLUCAP 121* 136* 165* 123* 122*   Lipid Profile: No results for input(s): "CHOL", "HDL", "LDLCALC", "TRIG", "CHOLHDL", "LDLDIRECT" in the last 72  hours. Thyroid Function Tests: No results for input(s): "TSH", "T4TOTAL", "FREET4", "T3FREE", "THYROIDAB" in the last 72 hours. Anemia Panel: No results for input(s): "VITAMINB12", "FOLATE", "FERRITIN", "TIBC", "IRON", "RETICCTPCT" in the last 72 hours.  Sepsis Labs: No results for input(s): "PROCALCITON", "LATICACIDVEN" in the last 168 hours.   Recent Results (from the past 240 hour(s))  MRSA Next Gen by PCR, Nasal     Status: Abnormal   Collection Time: 12/27/22  9:38 PM   Specimen: Nasal Mucosa; Nasal Swab  Result Value Ref Range Status   MRSA by PCR Next Gen DETECTED (A) NOT DETECTED Final    Comment: CRITICAL RESULT CALLED TO, READ BACK BY AND VERIFIED WITH: MELISSA COBB RN @ [phone] 12/28/22 [facility] (NOTE) The GeneXpert MRSA Assay (FDA approved for NASAL specimens only), is one component of a comprehensive MRSA colonization surveillance program. It is not intended to diagnose MRSA infection nor to guide or monitor treatment for MRSA infections. Test performance is not FDA approved in patients less than 74 years old. Performed at [facility], [address] [place], [place] [address]   Culture, blood (Routine X 2) w Reflex to ID Panel     Status: Abnormal   Collection Time: 12/28/22 10:47 AM   Specimen: BLOOD RIGHT HAND  Result Value Ref Range Status   Specimen Description   Final    BLOOD RIGHT HAND Performed at [facility], [address] [place], [place] [address]    Special Requests   Final    BOTTLES DRAWN AEROBIC AND ANAEROBIC Blood Culture adequate volume Performed at [facility], [address] [place], [place] [address]    Culture  Setup Time   Final    GRAM POSITIVE COCCI IN BOTH AEROBIC AND ANAEROBIC BOTTLES CRITICAL RESULT CALLED TO, READ BACK BY AND VERIFIED WITH: TREY GREENWOOD @ 2146 12/28/22 LFD Performed at [facility], [address] [place], [place] [address]    Culture (A)  Final    STAPHYLOCOCCUS  AUREUS SUSCEPTIBILITIES PERFORMED ON PREVIOUS CULTURE WITHIN THE LAST 5 DAYS. Performed at [facility], [address], [place], [place] [address]    Report Status 12/31/2022 FINAL  Final  Culture, blood (Routine X 2) w Reflex to ID Panel     Status: Abnormal   Collection Time: 12/28/22 10:48 AM   Specimen: BLOOD LEFT HAND  Result Value Ref Range Status   Specimen Description   Final    BLOOD LEFT HAND Performed at [facility], [address], [place], [place] [address]    Special Requests   Final    BOTTLES DRAWN AEROBIC AND ANAEROBIC Blood Culture adequate volume Performed at [facility], [address], [place], [place] [address]    Culture  Setup Time   Final    GRAM POSITIVE COCCI IN BOTH AEROBIC AND ANAEROBIC BOTTLES Organism ID to follow CRITICAL RESULT CALLED TO, READ BACK BY AND VERIFIED WITH: TREY GREENWOOD @2146  12/28/22 LFD Performed at [facility], [address] [place], [place] [address]    Culture METHICILLIN RESISTANT STAPHYLOCOCCUS AUREUS (A)  Final   Report Status 12/31/2022 FINAL  Final   Organism ID, Bacteria METHICILLIN RESISTANT STAPHYLOCOCCUS AUREUS  Final      Susceptibility   Methicillin resistant staphylococcus aureus - MIC*    CIPROFLOXACIN >=8 RESISTANT Resistant     ERYTHROMYCIN >=8 RESISTANT Resistant     GENTAMICIN <=0.5 SENSITIVE Sensitive     OXACILLIN >=4 RESISTANT Resistant  TETRACYCLINE <=1 SENSITIVE Sensitive     VANCOMYCIN <=0.5 SENSITIVE Sensitive     TRIMETH/SULFA >=320 RESISTANT Resistant     CLINDAMYCIN <=0.25 SENSITIVE Sensitive     RIFAMPIN <=0.5 SENSITIVE Sensitive     Inducible Clindamycin NEGATIVE Sensitive     LINEZOLID 2 SENSITIVE Sensitive     * METHICILLIN RESISTANT STAPHYLOCOCCUS AUREUS  Blood Culture ID Panel (Reflexed)     Status: Abnormal   Collection Time: 12/28/22 10:48 AM  Result Value Ref Range Status   Enterococcus faecalis NOT DETECTED NOT DETECTED Final    Enterococcus Faecium NOT DETECTED NOT DETECTED Final   Listeria monocytogenes NOT DETECTED NOT DETECTED Final   Staphylococcus species DETECTED (A) NOT DETECTED Final    Comment: CRITICAL RESULT CALLED TO, READ BACK BY AND VERIFIED WITH: TREY GREENWOOD @ 2146 12/28/22 LFD    Staphylococcus aureus (BCID) DETECTED (A) NOT DETECTED Final    Comment: Methicillin (oxacillin)-resistant Staphylococcus aureus (MRSA). MRSA is predictably resistant to beta-lactam antibiotics (except ceftaroline). Preferred therapy is vancomycin unless clinically contraindicated. Patient requires contact precautions if  hospitalized. CRITICAL RESULT CALLED TO, READ BACK BY AND VERIFIED WITH: TREY GREENWOOD @ 2146 12/28/22 LFD    Staphylococcus epidermidis NOT DETECTED NOT DETECTED Final   Staphylococcus lugdunensis NOT DETECTED NOT DETECTED Final   Streptococcus species NOT DETECTED NOT DETECTED Final   Streptococcus agalactiae NOT DETECTED NOT DETECTED Final   Streptococcus pneumoniae NOT DETECTED NOT DETECTED Final   Streptococcus pyogenes NOT DETECTED NOT DETECTED Final   A.calcoaceticus-baumannii NOT DETECTED NOT DETECTED Final   Bacteroides fragilis NOT DETECTED NOT DETECTED Final   Enterobacterales NOT DETECTED NOT DETECTED Final   Enterobacter cloacae complex NOT DETECTED NOT DETECTED Final   Escherichia coli NOT DETECTED NOT DETECTED Final   Klebsiella aerogenes NOT DETECTED NOT DETECTED Final   Klebsiella oxytoca NOT DETECTED NOT DETECTED Final   Klebsiella pneumoniae NOT DETECTED NOT DETECTED Final   Proteus species NOT DETECTED NOT DETECTED Final   Salmonella species NOT DETECTED NOT DETECTED Final   Serratia marcescens NOT DETECTED NOT DETECTED Final   Haemophilus influenzae NOT DETECTED NOT DETECTED Final   Neisseria meningitidis NOT DETECTED NOT DETECTED Final   Pseudomonas aeruginosa NOT DETECTED NOT DETECTED Final   Stenotrophomonas maltophilia NOT DETECTED NOT DETECTED Final   Candida albicans  NOT DETECTED NOT DETECTED Final   Candida auris NOT DETECTED NOT DETECTED Final   Candida glabrata NOT DETECTED NOT DETECTED Final   Candida krusei NOT DETECTED NOT DETECTED Final   Candida parapsilosis NOT DETECTED NOT DETECTED Final   Candida tropicalis NOT DETECTED NOT DETECTED Final   Cryptococcus neoformans/gattii NOT DETECTED NOT DETECTED Final   Meth resistant mecA/C and MREJ DETECTED (A) NOT DETECTED Final    Comment: CRITICAL RESULT CALLED TO, READ BACK BY AND VERIFIED WITH: TREY GREENWOOD @ 2146 12/28/22 LFD Performed at [facility], [address] [place], [place] [address]   Respiratory (~20 pathogens) panel by PCR     Status: None   Collection Time: 12/28/22 11:55 AM   Specimen: Nasopharyngeal Swab; Respiratory  Result Value Ref Range Status   Adenovirus NOT DETECTED NOT DETECTED Final   Coronavirus 229E NOT DETECTED NOT DETECTED Final    Comment: (NOTE) The Coronavirus on the Respiratory Panel, DOES NOT test for the novel  Coronavirus (2019 nCoV)    Coronavirus HKU1 NOT DETECTED NOT DETECTED Final   Coronavirus NL63 NOT DETECTED NOT DETECTED Final   Coronavirus OC43 NOT DETECTED NOT DETECTED Final  Metapneumovirus NOT DETECTED NOT DETECTED Final   Rhinovirus / Enterovirus NOT DETECTED NOT DETECTED Final   Influenza A NOT DETECTED NOT DETECTED Final   Influenza B NOT DETECTED NOT DETECTED Final   Parainfluenza Virus 1 NOT DETECTED NOT DETECTED Final   Parainfluenza Virus 2 NOT DETECTED NOT DETECTED Final   Parainfluenza Virus 3 NOT DETECTED NOT DETECTED Final   Parainfluenza Virus 4 NOT DETECTED NOT DETECTED Final   Respiratory Syncytial Virus NOT DETECTED NOT DETECTED Final   Bordetella pertussis NOT DETECTED NOT DETECTED Final   Bordetella Parapertussis NOT DETECTED NOT DETECTED Final   Chlamydophila pneumoniae NOT DETECTED NOT DETECTED Final   Mycoplasma pneumoniae NOT DETECTED NOT DETECTED Final    Comment: Performed at [facility], [address] [place], [place] [address]  SARS Coronavirus 2 by RT PCR (hospital order, performed in [facility] hospital lab) *cepheid single result test* Anterior Nasal Swab     Status: None   Collection Time: 12/29/22  1:06 PM   Specimen: Anterior Nasal Swab  Result Value Ref Range Status   SARS Coronavirus 2 by RT PCR NEGATIVE NEGATIVE Final    Comment: Performed at [facility], [address] [place], [place] [address]  MRSA Next Gen by PCR, Nasal     Status: Abnormal   Collection Time: 12/29/22  6:50 PM   Specimen: Nasal Mucosa; Nasal Swab  Result Value Ref Range Status   MRSA by PCR Next Gen DETECTED (A) NOT DETECTED Final    Comment: RESULT CALLED TO, READ BACK BY AND VERIFIED WITH: S. SAVAGE  RN 12/29/22 @ 2126 BY AB (NOTE) The GeneXpert MRSA Assay (FDA approved for NASAL specimens only), is one component of a comprehensive MRSA colonization surveillance program. It is not intended to diagnose MRSA infection nor to guide or monitor treatment for MRSA infections. Test performance is not FDA approved in patients less than 63 years old. Performed at [facility], [address], [place], [place] [address]   Culture, blood (Routine X 2) w Reflex to ID Panel     Status: Abnormal   Collection Time: 12/31/22  7:37 AM   Specimen: BLOOD RIGHT HAND  Result Value Ref Range Status   Specimen Description BLOOD RIGHT HAND  Final   Special Requests   Final    BOTTLES DRAWN AEROBIC AND ANAEROBIC Blood Culture adequate volume   Culture  Setup Time   Final    GRAM POSITIVE COCCI IN CLUSTERS ANAEROBIC BOTTLE ONLY CRITICAL RESULT CALLED TO, READ BACK BY AND VERIFIED WITH: PHARMD J. LEDFORD 01/01/23 @ 0330 BY AB Performed at [facility], [address] [place], [place] [address]    Culture METHICILLIN RESISTANT STAPHYLOCOCCUS AUREUS (A)  Final   Report Status 01/03/2023 FINAL  Final   Organism ID, Bacteria METHICILLIN RESISTANT STAPHYLOCOCCUS AUREUS  Final      Susceptibility    Methicillin resistant staphylococcus aureus - MIC*    CIPROFLOXACIN >=8 RESISTANT Resistant     ERYTHROMYCIN >=8 RESISTANT Resistant     GENTAMICIN <=0.5 SENSITIVE Sensitive     OXACILLIN >=4 RESISTANT Resistant     TETRACYCLINE <=1 SENSITIVE Sensitive     VANCOMYCIN 1 SENSITIVE Sensitive     TRIMETH/SULFA >=320 RESISTANT Resistant     CLINDAMYCIN <=0.25 SENSITIVE Sensitive     RIFAMPIN <=0.5 SENSITIVE Sensitive     Inducible Clindamycin NEGATIVE Sensitive     LINEZOLID 2 SENSITIVE Sensitive     *  METHICILLIN RESISTANT STAPHYLOCOCCUS AUREUS  Blood Culture ID Panel (Reflexed)     Status: Abnormal   Collection Time: 12/31/22  7:37 AM  Result Value Ref Range Status   Enterococcus faecalis NOT DETECTED NOT DETECTED Final   Enterococcus Faecium NOT DETECTED NOT DETECTED Final   Listeria monocytogenes NOT DETECTED NOT DETECTED Final   Staphylococcus species DETECTED (A) NOT DETECTED Final    Comment: CRITICAL RESULT CALLED TO, READ BACK BY AND VERIFIED WITH: PHARMD J. LEDFORD 01/01/23 @ 0330 BY AB    Staphylococcus aureus (BCID) DETECTED (A) NOT DETECTED Final    Comment: Methicillin (oxacillin)-resistant Staphylococcus aureus (MRSA). MRSA is predictably resistant to beta-lactam antibiotics (except ceftaroline). Preferred therapy is vancomycin unless clinically contraindicated. Patient requires contact precautions if  hospitalized. CRITICAL RESULT CALLED TO, READ BACK BY AND VERIFIED WITH: PHARMD J. LEDFORD 01/01/23 @ 0330 BY AB    Staphylococcus epidermidis NOT DETECTED NOT DETECTED Final   Staphylococcus lugdunensis NOT DETECTED NOT DETECTED Final   Streptococcus species NOT DETECTED NOT DETECTED Final   Streptococcus agalactiae NOT DETECTED NOT DETECTED Final   Streptococcus pneumoniae NOT DETECTED NOT DETECTED Final   Streptococcus pyogenes NOT DETECTED NOT DETECTED Final   A.calcoaceticus-baumannii NOT DETECTED NOT DETECTED Final   Bacteroides fragilis NOT DETECTED NOT DETECTED  Final   Enterobacterales NOT DETECTED NOT DETECTED Final   Enterobacter cloacae complex NOT DETECTED NOT DETECTED Final   Escherichia coli NOT DETECTED NOT DETECTED Final   Klebsiella aerogenes NOT DETECTED NOT DETECTED Final   Klebsiella oxytoca NOT DETECTED NOT DETECTED Final   Klebsiella pneumoniae NOT DETECTED NOT DETECTED Final   Proteus species NOT DETECTED NOT DETECTED Final   Salmonella species NOT DETECTED NOT DETECTED Final   Serratia marcescens NOT DETECTED NOT DETECTED Final   Haemophilus influenzae NOT DETECTED NOT DETECTED Final   Neisseria meningitidis NOT DETECTED NOT DETECTED Final   Pseudomonas aeruginosa NOT DETECTED NOT DETECTED Final   Stenotrophomonas maltophilia NOT DETECTED NOT DETECTED Final   Candida albicans NOT DETECTED NOT DETECTED Final   Candida auris NOT DETECTED NOT DETECTED Final   Candida glabrata NOT DETECTED NOT DETECTED Final   Candida krusei NOT DETECTED NOT DETECTED Final   Candida parapsilosis NOT DETECTED NOT DETECTED Final   Candida tropicalis NOT DETECTED NOT DETECTED Final   Cryptococcus neoformans/gattii NOT DETECTED NOT DETECTED Final   Meth resistant mecA/C and MREJ DETECTED (A) NOT DETECTED Final    Comment: CRITICAL RESULT CALLED TO, READ BACK BY AND VERIFIED WITH: PHARMD J. LEDFORD 01/01/23 @ 0330 BY AB Performed at [facility], [address] [place], [place] [address]   Culture, blood (Routine X 2) w Reflex to ID Panel     Status: None   Collection Time: 12/31/22  7:43 AM   Specimen: BLOOD RIGHT HAND  Result Value Ref Range Status   Specimen Description BLOOD RIGHT HAND  Final   Special Requests   Final    BOTTLES DRAWN AEROBIC ONLY Blood Culture adequate volume   Culture   Final    NO GROWTH 5 DAYS Performed at [facility], [address], [place], [place] [address]    Report Status 01/05/2023 FINAL  Final  Culture, blood (Routine X 2) w Reflex to ID Panel     Status: None (Preliminary result)   Collection  Time: 01/02/23  3:51 PM   Specimen: BLOOD LEFT HAND  Result Value Ref Range Status   Specimen Description BLOOD LEFT HAND  Final  Special Requests   Final    BOTTLES DRAWN AEROBIC AND ANAEROBIC Blood Culture adequate volume   Culture   Final    NO GROWTH 4 DAYS Performed at [facility], [address], [place], [place] [address]    Report Status PENDING  Incomplete  Culture, blood (Routine X 2) w Reflex to ID Panel     Status: None (Preliminary result)   Collection Time: 01/02/23  3:51 PM   Specimen: BLOOD RIGHT HAND  Result Value Ref Range Status   Specimen Description BLOOD RIGHT HAND  Final   Special Requests   Final    BOTTLES DRAWN AEROBIC ONLY Blood Culture results may not be optimal due to an inadequate volume of blood received in culture bottles   Culture   Final    NO GROWTH 4 DAYS Performed at [facility], [address] [place], [place] [address]    Report Status PENDING  Incomplete     Radiology Studies: No results found.    LOS: 8 days   Time spent= 35 mins  Barnetta Chapel, MD [facility]  If 7PM-7AM, please contact night-coverage  01/06/2023, 4:22 PM \

## 2023-01-06 NOTE — Progress Notes (Signed)
[facility]   Reason for visit: Follow up on bacteremia  Interval History: 2nd repeat blood cultures remain no growth at 4 days.  Previous repeat blood cultures with one bottle positive.  Still with same neck pain.   Day 10 total antibiotics  Physical Exam: Constitutional:  Vitals:   01/06/23 0828 01/06/23 1046  BP:  108/67  Pulse:  71  Resp:  18  Temp:  98.9 F (37.2 C)  SpO2: 100% 100%   patient appears in NAD Respiratory: Normal respiratory effort  Review of Systems: Constitutional: negative for fevers and chills  Lab Results  Component Value Date   WBC 9.9 01/04/2023   HGB 7.5 (L) 01/04/2023   HCT 24.1 (L) 01/04/2023   MCV 93.1 01/04/2023   PLT 230 01/04/2023    Lab Results  Component Value Date   CREATININE 1.67 (H) 01/06/2023   BUN 48 (H) 01/06/2023   NA 131 (L) 01/06/2023   K 4.8 01/06/2023   CL 102 01/06/2023   CO2 23 01/06/2023    Lab Results  Component Value Date   ALT 39 01/06/2023   AST 30 01/06/2023   ALKPHOS 75 01/06/2023     Microbiology: Recent Results (from the past 240 hour(s))  MRSA Next Gen by PCR, Nasal     Status: Abnormal   Collection Time: 12/27/22  9:38 PM   Specimen: Nasal Mucosa; Nasal Swab  Result Value Ref Range Status   MRSA by PCR Next Gen DETECTED (A) NOT DETECTED Final    Comment: CRITICAL RESULT CALLED TO, READ BACK BY AND VERIFIED WITH: MELISSA COBB RN @ [phone] 12/28/22 [facility] (NOTE) The GeneXpert MRSA Assay (FDA approved for NASAL specimens only), is one component of a comprehensive MRSA colonization surveillance program. It is not intended to diagnose MRSA infection nor to guide or monitor treatment for MRSA infections. Test performance is not FDA approved in patients less than 62 years old. Performed at [facility], [address] [place], [place] [address]   Culture, blood (Routine X 2) w Reflex to ID Panel     Status: Abnormal   Collection Time: 12/28/22 10:47 AM    Specimen: BLOOD RIGHT HAND  Result Value Ref Range Status   Specimen Description   Final    BLOOD RIGHT HAND Performed at [facility], [address], [place], [place] [address]    Special Requests   Final    BOTTLES DRAWN AEROBIC AND ANAEROBIC Blood Culture adequate volume Performed at [facility], [address] [place], [place] [address]    Culture  Setup Time   Final    GRAM POSITIVE COCCI IN BOTH AEROBIC AND ANAEROBIC BOTTLES CRITICAL RESULT CALLED TO, READ BACK BY AND VERIFIED WITH: TREY GREENWOOD @ 2146 12/28/22 LFD Performed at [facility], [address] [place], [place] [address]    Culture (A)  Final    STAPHYLOCOCCUS AUREUS SUSCEPTIBILITIES PERFORMED ON PREVIOUS CULTURE WITHIN THE LAST 5 DAYS. Performed at [facility], [address] [place], [place] [address]    Report Status 12/31/2022 FINAL  Final  Culture, blood (Routine X 2) w Reflex to ID Panel     Status: Abnormal   Collection Time: 12/28/22 10:48 AM   Specimen: BLOOD LEFT HAND  Result Value Ref Range Status   Specimen Description   Final    BLOOD LEFT HAND Performed at [facility], [address] [place], [place] [address]  Special Requests   Final    BOTTLES DRAWN AEROBIC AND ANAEROBIC Blood Culture adequate volume Performed at [facility], [address] [place], [place] [address]    Culture  Setup Time   Final    GRAM POSITIVE COCCI IN BOTH AEROBIC AND ANAEROBIC BOTTLES Organism ID to follow CRITICAL RESULT CALLED TO, READ BACK BY AND VERIFIED WITH: TREY GREENWOOD @2146  12/28/22 LFD Performed at [facility], [address] [place], [place] [address]    Culture METHICILLIN RESISTANT STAPHYLOCOCCUS AUREUS (A)  Final   Report Status 12/31/2022 FINAL  Final   Organism ID, Bacteria METHICILLIN RESISTANT STAPHYLOCOCCUS AUREUS  Final      Susceptibility   Methicillin resistant staphylococcus aureus - MIC*    CIPROFLOXACIN  >=8 RESISTANT Resistant     ERYTHROMYCIN >=8 RESISTANT Resistant     GENTAMICIN <=0.5 SENSITIVE Sensitive     OXACILLIN >=4 RESISTANT Resistant     TETRACYCLINE <=1 SENSITIVE Sensitive     VANCOMYCIN <=0.5 SENSITIVE Sensitive     TRIMETH/SULFA >=320 RESISTANT Resistant     CLINDAMYCIN <=0.25 SENSITIVE Sensitive     RIFAMPIN <=0.5 SENSITIVE Sensitive     Inducible Clindamycin NEGATIVE Sensitive     LINEZOLID 2 SENSITIVE Sensitive     * METHICILLIN RESISTANT STAPHYLOCOCCUS AUREUS  Blood Culture ID Panel (Reflexed)     Status: Abnormal   Collection Time: 12/28/22 10:48 AM  Result Value Ref Range Status   Enterococcus faecalis NOT DETECTED NOT DETECTED Final   Enterococcus Faecium NOT DETECTED NOT DETECTED Final   Listeria monocytogenes NOT DETECTED NOT DETECTED Final   Staphylococcus species DETECTED (A) NOT DETECTED Final    Comment: CRITICAL RESULT CALLED TO, READ BACK BY AND VERIFIED WITH: TREY GREENWOOD @ 2146 12/28/22 LFD    Staphylococcus aureus (BCID) DETECTED (A) NOT DETECTED Final    Comment: Methicillin (oxacillin)-resistant Staphylococcus aureus (MRSA). MRSA is predictably resistant to beta-lactam antibiotics (except ceftaroline). Preferred therapy is vancomycin unless clinically contraindicated. Patient requires contact precautions if  hospitalized. CRITICAL RESULT CALLED TO, READ BACK BY AND VERIFIED WITH: TREY GREENWOOD @ 2146 12/28/22 LFD    Staphylococcus epidermidis NOT DETECTED NOT DETECTED Final   Staphylococcus lugdunensis NOT DETECTED NOT DETECTED Final   Streptococcus species NOT DETECTED NOT DETECTED Final   Streptococcus agalactiae NOT DETECTED NOT DETECTED Final   Streptococcus pneumoniae NOT DETECTED NOT DETECTED Final   Streptococcus pyogenes NOT DETECTED NOT DETECTED Final   A.calcoaceticus-baumannii NOT DETECTED NOT DETECTED Final   Bacteroides fragilis NOT DETECTED NOT DETECTED Final   Enterobacterales NOT DETECTED NOT DETECTED Final   Enterobacter  cloacae complex NOT DETECTED NOT DETECTED Final   Escherichia coli NOT DETECTED NOT DETECTED Final   Klebsiella aerogenes NOT DETECTED NOT DETECTED Final   Klebsiella oxytoca NOT DETECTED NOT DETECTED Final   Klebsiella pneumoniae NOT DETECTED NOT DETECTED Final   Proteus species NOT DETECTED NOT DETECTED Final   Salmonella species NOT DETECTED NOT DETECTED Final   Serratia marcescens NOT DETECTED NOT DETECTED Final   Haemophilus influenzae NOT DETECTED NOT DETECTED Final   Neisseria meningitidis NOT DETECTED NOT DETECTED Final   Pseudomonas aeruginosa NOT DETECTED NOT DETECTED Final   Stenotrophomonas maltophilia NOT DETECTED NOT DETECTED Final   Candida albicans NOT DETECTED NOT DETECTED Final   Candida auris NOT DETECTED NOT DETECTED Final   Candida glabrata NOT DETECTED NOT DETECTED Final   Candida krusei NOT DETECTED NOT DETECTED Final   Candida parapsilosis NOT DETECTED NOT DETECTED Final  Candida tropicalis NOT DETECTED NOT DETECTED Final   Cryptococcus neoformans/gattii NOT DETECTED NOT DETECTED Final   Meth resistant mecA/C and MREJ DETECTED (A) NOT DETECTED Final    Comment: CRITICAL RESULT CALLED TO, READ BACK BY AND VERIFIED WITH: TREY GREENWOOD @ 2146 12/28/22 LFD Performed at [facility], [address] [place], [place] [address]   Respiratory (~20 pathogens) panel by PCR     Status: None   Collection Time: 12/28/22 11:55 AM   Specimen: Nasopharyngeal Swab; Respiratory  Result Value Ref Range Status   Adenovirus NOT DETECTED NOT DETECTED Final   Coronavirus 229E NOT DETECTED NOT DETECTED Final    Comment: (NOTE) The Coronavirus on the Respiratory Panel, DOES NOT test for the novel  Coronavirus (2019 nCoV)    Coronavirus HKU1 NOT DETECTED NOT DETECTED Final   Coronavirus NL63 NOT DETECTED NOT DETECTED Final   Coronavirus OC43 NOT DETECTED NOT DETECTED Final   Metapneumovirus NOT DETECTED NOT DETECTED Final   Rhinovirus / Enterovirus NOT DETECTED NOT  DETECTED Final   Influenza A NOT DETECTED NOT DETECTED Final   Influenza B NOT DETECTED NOT DETECTED Final   Parainfluenza Virus 1 NOT DETECTED NOT DETECTED Final   Parainfluenza Virus 2 NOT DETECTED NOT DETECTED Final   Parainfluenza Virus 3 NOT DETECTED NOT DETECTED Final   Parainfluenza Virus 4 NOT DETECTED NOT DETECTED Final   Respiratory Syncytial Virus NOT DETECTED NOT DETECTED Final   Bordetella pertussis NOT DETECTED NOT DETECTED Final   Bordetella Parapertussis NOT DETECTED NOT DETECTED Final   Chlamydophila pneumoniae NOT DETECTED NOT DETECTED Final   Mycoplasma pneumoniae NOT DETECTED NOT DETECTED Final    Comment: Performed at [facility], [address], [place], [place] [address]  SARS Coronavirus 2 by RT PCR (hospital order, performed in [facility] hospital lab) *cepheid single result test* Anterior Nasal Swab     Status: None   Collection Time: 12/29/22  1:06 PM   Specimen: Anterior Nasal Swab  Result Value Ref Range Status   SARS Coronavirus 2 by RT PCR NEGATIVE NEGATIVE Final    Comment: Performed at [facility], [address] [place], [place] [address]  MRSA Next Gen by PCR, Nasal     Status: Abnormal   Collection Time: 12/29/22  6:50 PM   Specimen: Nasal Mucosa; Nasal Swab  Result Value Ref Range Status   MRSA by PCR Next Gen DETECTED (A) NOT DETECTED Final    Comment: RESULT CALLED TO, READ BACK BY AND VERIFIED WITH: S. SAVAGE  RN 12/29/22 @ 2126 BY AB (NOTE) The GeneXpert MRSA Assay (FDA approved for NASAL specimens only), is one component of a comprehensive MRSA colonization surveillance program. It is not intended to diagnose MRSA infection nor to guide or monitor treatment for MRSA infections. Test performance is not FDA approved in patients less than 91 years old. Performed at [facility], [address] [place], [place] [address]   Culture, blood (Routine X 2) w Reflex to ID Panel     Status: Abnormal   Collection Time: 12/31/22   7:37 AM   Specimen: BLOOD RIGHT HAND  Result Value Ref Range Status   Specimen Description BLOOD RIGHT HAND  Final   Special Requests   Final    BOTTLES DRAWN AEROBIC AND ANAEROBIC Blood Culture adequate volume   Culture  Setup Time   Final    GRAM POSITIVE COCCI IN CLUSTERS ANAEROBIC BOTTLE ONLY CRITICAL RESULT CALLED TO, READ BACK  BY AND VERIFIED WITH: PHARMD J. LEDFORD 01/01/23 @ 0330 BY AB Performed at [facility], [address], [place], [place] [address]    Culture METHICILLIN RESISTANT STAPHYLOCOCCUS AUREUS (A)  Final   Report Status 01/03/2023 FINAL  Final   Organism ID, Bacteria METHICILLIN RESISTANT STAPHYLOCOCCUS AUREUS  Final      Susceptibility   Methicillin resistant staphylococcus aureus - MIC*    CIPROFLOXACIN >=8 RESISTANT Resistant     ERYTHROMYCIN >=8 RESISTANT Resistant     GENTAMICIN <=0.5 SENSITIVE Sensitive     OXACILLIN >=4 RESISTANT Resistant     TETRACYCLINE <=1 SENSITIVE Sensitive     VANCOMYCIN 1 SENSITIVE Sensitive     TRIMETH/SULFA >=320 RESISTANT Resistant     CLINDAMYCIN <=0.25 SENSITIVE Sensitive     RIFAMPIN <=0.5 SENSITIVE Sensitive     Inducible Clindamycin NEGATIVE Sensitive     LINEZOLID 2 SENSITIVE Sensitive     * METHICILLIN RESISTANT STAPHYLOCOCCUS AUREUS  Blood Culture ID Panel (Reflexed)     Status: Abnormal   Collection Time: 12/31/22  7:37 AM  Result Value Ref Range Status   Enterococcus faecalis NOT DETECTED NOT DETECTED Final   Enterococcus Faecium NOT DETECTED NOT DETECTED Final   Listeria monocytogenes NOT DETECTED NOT DETECTED Final   Staphylococcus species DETECTED (A) NOT DETECTED Final    Comment: CRITICAL RESULT CALLED TO, READ BACK BY AND VERIFIED WITH: PHARMD J. LEDFORD 01/01/23 @ 0330 BY AB    Staphylococcus aureus (BCID) DETECTED (A) NOT DETECTED Final    Comment: Methicillin (oxacillin)-resistant Staphylococcus aureus (MRSA). MRSA is predictably resistant to beta-lactam antibiotics (except ceftaroline).  Preferred therapy is vancomycin unless clinically contraindicated. Patient requires contact precautions if  hospitalized. CRITICAL RESULT CALLED TO, READ BACK BY AND VERIFIED WITH: PHARMD J. LEDFORD 01/01/23 @ 0330 BY AB    Staphylococcus epidermidis NOT DETECTED NOT DETECTED Final   Staphylococcus lugdunensis NOT DETECTED NOT DETECTED Final   Streptococcus species NOT DETECTED NOT DETECTED Final   Streptococcus agalactiae NOT DETECTED NOT DETECTED Final   Streptococcus pneumoniae NOT DETECTED NOT DETECTED Final   Streptococcus pyogenes NOT DETECTED NOT DETECTED Final   A.calcoaceticus-baumannii NOT DETECTED NOT DETECTED Final   Bacteroides fragilis NOT DETECTED NOT DETECTED Final   Enterobacterales NOT DETECTED NOT DETECTED Final   Enterobacter cloacae complex NOT DETECTED NOT DETECTED Final   Escherichia coli NOT DETECTED NOT DETECTED Final   Klebsiella aerogenes NOT DETECTED NOT DETECTED Final   Klebsiella oxytoca NOT DETECTED NOT DETECTED Final   Klebsiella pneumoniae NOT DETECTED NOT DETECTED Final   Proteus species NOT DETECTED NOT DETECTED Final   Salmonella species NOT DETECTED NOT DETECTED Final   Serratia marcescens NOT DETECTED NOT DETECTED Final   Haemophilus influenzae NOT DETECTED NOT DETECTED Final   Neisseria meningitidis NOT DETECTED NOT DETECTED Final   Pseudomonas aeruginosa NOT DETECTED NOT DETECTED Final   Stenotrophomonas maltophilia NOT DETECTED NOT DETECTED Final   Candida albicans NOT DETECTED NOT DETECTED Final   Candida auris NOT DETECTED NOT DETECTED Final   Candida glabrata NOT DETECTED NOT DETECTED Final   Candida krusei NOT DETECTED NOT DETECTED Final   Candida parapsilosis NOT DETECTED NOT DETECTED Final   Candida tropicalis NOT DETECTED NOT DETECTED Final   Cryptococcus neoformans/gattii NOT DETECTED NOT DETECTED Final   Meth resistant mecA/C and MREJ DETECTED (A) NOT DETECTED Final    Comment: CRITICAL RESULT CALLED TO, READ BACK BY AND VERIFIED  WITH: PHARMD J. LEDFORD 01/01/23 @ 0330 BY AB Performed at  [facility], [address] [place] [place], [place] [address]   Culture, blood (Routine X 2) w Reflex to ID Panel     Status: None   Collection Time: 12/31/22  7:43 AM   Specimen: BLOOD RIGHT HAND  Result Value Ref Range Status   Specimen Description BLOOD RIGHT HAND  Final   Special Requests   Final    BOTTLES DRAWN AEROBIC ONLY Blood Culture adequate volume   Culture   Final    NO GROWTH 5 DAYS Performed at [facility], [address], [place], [place] [address]    Report Status 01/05/2023 FINAL  Final  Culture, blood (Routine X 2) w Reflex to ID Panel     Status: None (Preliminary result)   Collection Time: 01/02/23  3:51 PM   Specimen: BLOOD LEFT HAND  Result Value Ref Range Status   Specimen Description BLOOD LEFT HAND  Final   Special Requests   Final    BOTTLES DRAWN AEROBIC AND ANAEROBIC Blood Culture adequate volume   Culture   Final    NO GROWTH 4 DAYS Performed at [facility], [address], [place], [place] [address]    Report Status PENDING  Incomplete  Culture, blood (Routine X 2) w Reflex to ID Panel     Status: None (Preliminary result)   Collection Time: 01/02/23  3:51 PM   Specimen: BLOOD RIGHT HAND  Result Value Ref Range Status   Specimen Description BLOOD RIGHT HAND  Final   Special Requests   Final    BOTTLES DRAWN AEROBIC ONLY Blood Culture results may not be optimal due to an inadequate volume of blood received in culture bottles   Culture   Final    NO GROWTH 4 DAYS Performed at [facility], [address] [place], [place] [address]    Report Status PENDING  Incomplete    Impression/Plan:  1. 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 On vancomycin and will continue.   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   TEE not yet done with ongoing Palliative care discussions.   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  Depending on goals of care, can consider linezolid for 2 weeks followed by oral doxycyline long term as a more palliative approach without the need for prolonged IV antibiotics.  Neither approach is currative though if TEE pursued, can determine if the AICD is infected or not.     At this time, will sign off but call if there are any changes or new info.    I have personally spent 50 minutes involved in face-to-face and non-face-to-face activities for this patient on the day of the visit. Professional time spent includes the following activities: Preparing to see the patient (review of tests), Obtaining and/or reviewing separately obtained history (admission/discharge record), Performing a medically appropriate examination and/or evaluation , Ordering medications/tests/procedures, referring and communicating with other health care professionals, Documenting clinical information in the EMR, Independently interpreting results (not separately reported), Communicating results to the patient/family/caregiver, Counseling and educating the patient/family/caregiver and Care coordination (not separately reported).

## 2023-01-06 NOTE — Progress Notes (Cosign Needed)
Rounding Note    Patient Name: Matthew Zamora Date of Encounter: 01/06/2023  [facility] Cardiologist: None  Subjective   Continues to c/o diffuse body pain, requests TV be changed to a western channel, more interactive today  Inpatient Medications    Scheduled Meds:  acetaminophen  1,000 mg Oral TID   apixaban  5 mg Oral BID   atorvastatin  80 mg Oral QHS   budesonide (PULMICORT) nebulizer solution  0.25 mg Nebulization BID   diclofenac Sodium  4 g Topical QID   feeding supplement  237 mL Oral BID BM   feeding supplement (PROSource TF20)  60 mL Per Tube Daily   gabapentin  100 mg Oral QHS   ipratropium-albuterol  3 mL Nebulization BID   lidocaine  2 patch Transdermal Q24H   metoprolol tartrate  75 mg Oral BID   midodrine  5 mg Oral TID WC   multivitamin  1 tablet Oral QHS   pantoprazole  40 mg Oral BID   pyridostigmine  60 mg Oral Q8H   sertraline  25 mg Oral Daily   sucralfate  1 g Oral TID WC & HS   ticagrelor  90 mg Oral BID   Or   ticagrelor  90 mg Per Tube BID   Continuous Infusions:  sodium chloride Stopped (01/06/23 0517)   amiodarone 30 mg/hr (01/06/23 0701)   feeding supplement (OSMOLITE 1.5 CAL) 50 mL/hr at 01/06/23 0701   vancomycin Stopped (01/05/23 2259)   PRN Meds: sodium chloride, docusate sodium, hydrALAZINE, hydrocortisone, hydrocortisone cream, levalbuterol, loratadine, metoprolol tartrate, Muscle Rub, ondansetron (ZOFRAN) IV, mouth rinse, oxyCODONE, polyethylene glycol, polyvinyl alcohol, senna-docusate, sodium chloride   Vital Signs    Vitals:   01/06/23 0500 01/06/23 0600 01/06/23 0826 01/06/23 0828  BP:  (!) 106/55    Pulse: 71 60    Resp: 19 14    Temp:      TempSrc:      SpO2: 99% 100% 99% 100%  Weight:        Intake/Output Summary (Last 24 hours) at 01/06/2023 0850 Last data filed at 01/06/2023 0701 Gross per 24 hour  Intake 4796.27 ml  Output 800 ml  Net 3996.27 ml      01/06/2023    3:29 AM 01/05/2023     3:22 AM 01/04/2023    3:40 AM  Last 3 Weights  Weight (lbs) 163 lb 5.8 oz 162 lb 0.6 oz 159 lb 6.3 oz  Weight (kg) 74.1 kg 73.5 kg 72.3 kg      Telemetry    SR 70's-80's, rare V pacing nocturnal, no VT over the weekend- Personally Reviewed  ECG    No new EKGs - Personally Reviewed  Physical Exam   GEN: NAD, daughter in law is bedside, he is visiting with her nicely Neck: No JVD Cardiac: RRR, no murmurs, rubs, or gallops.  Respiratory: CTA b/l (ant auscultation only). GI: Soft, nontender, non-distended  MS: RUE is lesss wollen and erythematous this AM Neuro:  AAO x2, follows commands Psych: Normal affect   Labs    High Sensitivity Troponin:   Recent Labs  Lab 12/27/22 2138 12/29/22 2233 12/30/22 0020 01/04/23 1011 01/04/23 1246  TROPONINIHS 30* 38* 38* 13 14     Chemistry Recent Labs  Lab 01/02/23 0643 01/02/23 1734 01/03/23 0450 01/04/23 1011  NA 135  --  138 137  K 4.1  --  4.4 4.2  CL 110  --  110 103  CO2 19*  --  20* 19*  GLUCOSE 117*  --  99 146*  BUN 23  --  26* 31*  CREATININE 1.64*  --  1.39* 1.45*  CALCIUM 8.6*  --  9.1 8.7*  MG 2.0 1.8 2.0 1.9  GFRNONAA 42*  --  51* 49*  ANIONGAP 6  --  8 15    Lipids  No results for input(s): "CHOL", "TRIG", "HDL", "LABVLDL", "LDLCALC", "CHOLHDL" in the last 168 hours.   Hematology Recent Labs  Lab 01/02/23 0643 01/03/23 0450 01/04/23 1011  WBC 11.8* 11.4* 9.9  RBC 2.91* 3.11* 2.59*  HGB 8.3* 8.7* 7.5*  HCT 27.1* 29.1* 24.1*  MCV 93.1 93.6 93.1  MCH 28.5 28.0 29.0  MCHC 30.6 29.9* 31.1  RDW 16.1* 16.0* 16.1*  PLT 167 207 230   Thyroid No results for input(s): "TSH", "FREET4" in the last 168 hours.  BNP Recent Labs  Lab 01/04/23 1011  BNP 1,223.7*    DDimer  No results for input(s): "DDIMER" in the last 168 hours.    Radiology    DG Abd Portable 1V Result Date: 01/01/2023 CLINICAL DATA:  Feeding tube placement EXAM: PORTABLE ABDOMEN - 1 VIEW COMPARISON:  04/30/2018 FINDINGS: AICD leads  noted. A feeding tube is present with tip in the stomach antrum. Indistinct left hemidiaphragm, cannot exclude mild left basilar atelectasis. IMPRESSION: 1. Feeding tube tip in the stomach antrum. Electronically Signed   By: Gaylyn Rong M.D.   On: 01/01/2023 16:31   DG CHEST PORT 1 VIEW Result Date: 01/01/2023 CLINICAL DATA:  Possible density in right upper lobe seen in CT thoracic spine done on 12/31/2022 EXAM: PORTABLE CHEST 1 VIEW COMPARISON:  Previous studies including this CT thoracic spine done on 12/31/2022 FINDINGS: Transverse diameter of heart is increased. Thoracic aorta is tortuous and ectatic. Pacemaker/defibrillator battery is seen in the left infraclavicular region. Small faint patchy density is seen in right parahilar region. There is interval appearance of moderate sized area of increased markings in left lower lung field. Left lateral CP angle is indistinct. There is no pneumothorax. IMPRESSION: Small faint patchy density in right parahilar region may suggest subsegmental atelectasis. There is moderate sized new infiltrate in left lower lung field suggesting atelectasis/pneumonia. Small left pleural effusion. Electronically Signed   By: Ernie Avena M.D.   On: 01/01/2023 13:51    Cardiac Studies    12/29/22: TTE 1. Global hypokinesis worse in the septum. Left ventricular ejection  fraction, by estimation, is 30 to 35%. The left ventricle has moderately  decreased function. The left ventricle demonstrates global hypokinesis.  There is mild left ventricular  hypertrophy of the septal segment. Left ventricular diastolic parameters  are indeterminate.   2. Right ventricular systolic function is normal. The right ventricular  size is normal.   3. The mitral valve is normal in structure. Mild mitral valve  regurgitation. No evidence of mitral stenosis.   4. The aortic valve is normal in structure. Aortic valve regurgitation is  not visualized. No aortic stenosis is  present.   5. The inferior vena cava is normal in size with greater than 50%  respiratory variability, suggesting right atrial pressure of 3 mmHg.  CARE EVERYWHERE note  Sept 2022. Mr. Gehrig has had some episodes of AFib but also recently of Vtach. We were planning on getting a cardiac cath on him but he developed COVID. In the interim, his metoprolol was stopped and he had a few more episodes of VT. He is feeling better after the re-introduction of  metoprolol. We reassessed his coronaries with minimal contrast ~ 20 ml and he had no new critical disease.   Patient Profile     82 y.o. male w/PMHx of O2 dep COPD, HTN, HLD, CAD (PCI > RCA 2019), ICM, ICD, VT, AFib   Mr. Tapp noted to have had a few hospitalizations recently was in the hospital after a fall in May 2024  diagnosed with right fracture femoral patient treated underwent right Hemi arthroscopy transferred to rehab had some complications with recurrent atrial fibrillation patient received both inappropriate iCD therapy for rapid AFib as well as appropriate for VF by notes as well, started on amiodarone and transitioned to p.o. discharge    Readmitted to 11/10/22 with acute/chronic CHF, pneumonia and rapid AFib, managed by his [facility] team Noting that he required midodrine for his BP, limiting GDMT as well as his CKD. Amiodardone held 2/2 transaminitis > rate control strategy   Readmitted 12/17/22 with SOB > home O2 increased >> BIPAP felt to be COPD/CHF improved with IV duiretics > held with risoing Creat exacerbation Did have some RVR managed with diltiazem via IM. Cardiology brought on board rates mostly controlled reportedly with brief faster rates advised to continue his BB and f/u w/his [facility] team. Discharged 12/20/22   Readmitted 12/27/22 THIS admission SOB/CP, tachycardic 140's started on dilt gtt, acute/chronic CHF exacerbation (felt less COPD this admission), febrile to 103 Cardiology felt HR driven by sepsis, managed with  amiodarone again and consideration for PRN dig MRSA bacteremia found Neurology consulted for code stroke with transient R sided weakness and confusion, neck pain. 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 with fluctuating symptoms overnight despite anticoagulation, suspect that this is an unstable thrombus. And in thier discussion with neuro IR and vascular surgery, the decision was made to proceed with endovascular mechanical thrombectomy transported to [facility] for this procedure.  Not a TNK candidate There was mention of perhaps meningitis with his severe neck pain, not safe to LP given his anticoagulation, and need to continue anticoagulation.  Neuro favored increasing the blood level goal for his vancomycin to cover him in case he has seeded his meninges as well.  12/29/22 underwent complete revascularization of the  proximal left internal carotid artery with 1 pass with a 6 mm x 47 embotrap  retrieval device with contact aspiration, and proximal flow arrest revealing underlying severe stenosis at the distal aspect of the bulb. Status post stent assisted angioplasty of proximal Lt ICA stenosis with proximal and distal protection. > cangrelor gtt > ASA, brilinta  EP consulted 12/30/22 for device in setting of MRSA bacteremia, at the time AAO x1, on pressor support, anemic/thrombocytopenic and s/p acute ICD stent unable to interrup DAPT >> not an ICD system extraction candidate  Now on Eliquis and Brilinta  12/31/22: BC remain MRSA positive 12/31/22 started on low dose BB   Pt has stopped accepting oral intake, >> Cortrack 01/01/23  01/01/23: palliative notes: ramins full code, d/w son (who's wife is an NP)  01/02/23 gen cards called for Afib management recs, his BB titrated noting allergy to amiodarone/mexiletine, preference to avoid dig.  Also commented once/or if his mental status improves would need to be cleared to come off antiplatelet and anticoagulant in order to  pursue TEE  01/02/23  had sustained VT treated with 2 rounds of ATP > HV therapy Increased BB dose given after his VT Lopressor 75mg  BID  7/22 > transition to PO amiodarone Moidrine being weaned ,  lopressor at 75mg  BID    Assessment & Plan    ICD/MRSA bacteremia VT Secondary prevention indication Device check this AM EVRA dual chamber ICD Can implanted 09/05/2017 RA lead is 5076 with an implant date as 09/11/2017 RV lead is a 6947 implanted 05/16/2008  Amiodarone and mexiletine are mentioned as allergy in note/history, unclear what the reaction is. Care everywhere notes: Nitrofuran analogues and Amiodarone analogues  reaction listed as  "Transaminitis" for amiodarone "Transaminitis ** confounded with amiodarone/mexiletine": for nitrofuran  CMP today to check his LFTs Transition to oral amiodarone  He clinically remains poor candidate for ICD system extraction He is clearly using his device Though seems to be perking up some, took some oral food with family More interactive this morning Will follow along  Paroxysmal Afib CHA2DS2Vasc is 5, on Eliquis   Further as per attending service  For questions or updates, please contact [facility] Please consult [email or web address] for contact info under     Signed, Sheilah Pigeon, PA-C  01/06/2023, 8:50 AM    EP Attending  Patient seen and examined. Agree with the findings as noted above. He does look a little better today. Continue supportive care. Currently not a candidate for lead extraction.    Sharlot Gowda Mayra Jolliffe,MD

## 2023-01-06 NOTE — Progress Notes (Signed)
Physical Therapy Treatment Patient Details Name: Matthew Zamora MRN: [id number] DOB: January 09, 1941 Today's Date: 01/06/2023   History of Present Illness 82 yo male admitted to [facility] 7/12 with SOB with decompensated HF. 7/13 hypotension with sepsis. 7/14 Rt weakness with code stroke and transfer to [facility], s/p Lt ICA thrombectomy with residual stenosis ultimately requiring left ICA stent placement.  Extubated post procedure but very confused. PMhx: COPD, CHF, AFib, CKD, HTN    PT Comments  Initially worked on decr pain of shoulders prior to mobilizing using gentle stretching and soft tissue massage. With mobilizing to EOB pt began immediately panicking and insisting to lie back down stating he couldn't breathe. SpO2 98% on 2L at the time. Performed LE exercises at that point. Anxiety and fear limiting factor today for mobility. Patient will benefit from continued inpatient follow up therapy, <3 hours/day      Assistance Recommended at Discharge Frequent or constant Supervision/Assistance  If plan is discharge home, recommend the following:  Can travel by private vehicle    Two people to help with walking and/or transfers;Two people to help with bathing/dressing/bathroom;Direct supervision/assist for financial management;Assist for transportation;Direct supervision/assist for medications management;Assistance with cooking/housework   No  Equipment Recommendations  None recommended by PT    Recommendations for Other Services       Precautions / Restrictions Precautions Precautions: Fall;Other (comment) Precaution Comments: very anxious     Mobility  Bed Mobility Overal bed mobility: Needs Assistance Bed Mobility: Supine to Sit, Sit to Supine     Supine to sit: Mod assist Sit to supine: Mod assist   General bed mobility comments: Assis to bring legs off EOB, elevate trunk into sitting and bring hips to EOB. Assist to bring legs back up into bed returning to supine. Within 5-10 sec  of sitting EOB pt began panicking and insisted on returning to supine stating he couldn't breath. SpO2 98% on 2L at the time.    Transfers                   General transfer comment: Pt with severe anxiety almost immediately on sitting EOB and insisted on return to supine.    Ambulation/Gait                   Stairs             Wheelchair Mobility     Tilt Bed    Modified Rankin (Stroke Patients Only)       Balance Overall balance assessment: Needs assistance Sitting-balance support: Bilateral upper extremity supported, Feet unsupported Sitting balance-Leahy Scale: Poor Sitting balance - Comments: UE support and min assist for the 5-10 seconds he tolerated sitting EOB                                    Cognition Arousal/Alertness: Awake/alert Behavior During Therapy: Anxious Overall Cognitive Status: No family/caregiver present to determine baseline cognitive functioning Area of Impairment: Attention, Following commands, Problem solving, Awareness, Safety/judgement, Memory                   Current Attention Level: Selective   Following Commands: Follows one step commands with increased time Safety/Judgement: Decreased awareness of safety, Decreased awareness of deficits Awareness: Intellectual Problem Solving: Slow processing, Requires verbal cues, Requires tactile cues          Exercises General Exercises - Lower Extremity Ankle Circles/Pumps: AAROM,  10 reps, Both, Supine Heel Slides: AAROM, Both, 10 reps, Supine Hip ABduction/ADduction: AAROM, Both, 10 reps, Supine Straight Leg Raises: AAROM, Both, 10 reps, Supine    General Comments        Pertinent Vitals/Pain Pain Assessment Pain Assessment: Faces Faces Pain Scale: Hurts whole lot Pain Location: bilateral shoulders Pain Descriptors / Indicators: Grimacing, Guarding, Moaning Pain Intervention(s): Limited activity within patient's tolerance, Monitored  during session, Repositioned (soft tissue massage and gentle stretch)    Home Living                          Prior Function            PT Goals (current goals can now be found in the care plan section)      Frequency    Min 1X/week      PT Plan Current plan remains appropriate;Frequency needs to be updated    Co-evaluation              AM-PAC PT "6 Clicks" Mobility   Outcome Measure  Help needed turning from your back to your side while in a flat bed without using bedrails?: A Lot Help needed moving from lying on your back to sitting on the side of a flat bed without using bedrails?: A Lot Help needed moving to and from a bed to a chair (including a wheelchair)?: Total Help needed standing up from a chair using your arms (e.g., wheelchair or bedside chair)?: Total Help needed to walk in hospital room?: Total Help needed climbing 3-5 steps with a railing? : Total 6 Click Score: 8    End of Session Equipment Utilized During Treatment: Oxygen Activity Tolerance: Patient limited by fatigue;Other (comment) (Limited by anxiety) Patient left: in bed;with call bell/phone within reach;with bed alarm set Nurse Communication: Mobility status PT Visit Diagnosis: Muscle weakness (generalized) (M62.81);Difficulty in walking, not elsewhere classified (R26.2)     Time: 1240-1315 PT Time Calculation (min) (ACUTE ONLY): 35 min  Charges:    $Therapeutic Exercise: 8-22 mins $Therapeutic Activity: 8-22 mins PT General Charges $$ ACUTE PT VISIT: 1 Visit                     [facility] PT Acute Rehabilitation Services Office [phone]    Angelina Ok [facility] 01/06/2023, 2:52 PM

## 2023-01-06 NOTE — Plan of Care (Signed)
  Problem: Elimination: Goal: Will not experience complications related to bowel motility Outcome: Progressing   Problem: Coping: Goal: Will verbalize positive feelings about self Outcome: Progressing Goal: Will identify appropriate support needs Outcome: Progressing

## 2023-01-06 NOTE — Progress Notes (Signed)
Nutrition Follow-up  DOCUMENTATION CODES:   Severe malnutrition in context of chronic illness  INTERVENTION:   Transition to nocturnal tube feeds via Cortrak: - Osmolite 1.5 @ 75 ml/hr x 12 hours from 1800 to 0600 (total of 900 ml nightly) - Continue PROSource TF20 60 ml daily  Nocturnal tube feeding regimen provides 1430 kcal, 76 grams of protein, and 686 ml of H2O (meets 82% of minimum kcal needs and 84% of minimum protein needs).  - Continue renal MVI daily  - Continue Ensure Enlive po BID, each supplement provides 350 kcal and 20 grams of protein  - Continue feeding assistance with meals  NUTRITION DIAGNOSIS:   Severe Malnutrition related to chronic illness (HFrEF, COPD, CKD stage IIIb) as evidenced by severe fat depletion, severe muscle depletion.  Ongoing, being addressed via supplements and TF  GOAL:   Patient will meet greater than or equal to 90% of their needs  Progressing  MONITOR:   PO intake, Supplement acceptance, Labs, Weight trends, TF tolerance  REASON FOR ASSESSMENT:   Consult Enteral/tube feeding initiation and management  ASSESSMENT:   82 year old male who presented to [facility] ED on 7/12 with SOB. PMH of HFrEF, COPD, atrial fibrillation, CKD stage IIIb, HTN, CAD s/p prior PCI. Pt admitted with acute on chronic combined systolic and diastolic CHF, acute on chronic respiratory failure with hypoxia, sepsis, MRSA bacteremia.  07/14 - pt found to have L ICA thrombus, transferred to [facility] for endovascular intervention, s/p thrombectomy and L ICA stent placement, extubated post-procedure 07/15 - Regular diet, thin liquids 07/17 - pt refusing to eat, Cortrak placed (tip gastric), TF started 07/18 - pt removed Cortrak 07/19 - Cortrak replaced (tip mid-stomach)  RD working remotely. Discussed pt with RN and MD. RN reports pt ate some of a breakfast biscuit that was brought in by family this morning. Otherwise, breakfast meal tray from hospital was untouched.  Given pt is eating something compared to previously when he was refusing all POs, will transition to nocturnal tube feeds.  Palliative Medicine continues to follow for GOC.  Weight up 7 kg this admission; pt is net positive 8.1 L with mild pitting generalized edema and mild pitting edema to BUE.  Admit weight: 67.2 kg Current weight: 74.1 kg   Current TF: Osmolite 1.5 @ 50 ml/hr, PROSource TF20 60 ml daily, megace 400 mg BID, rena-vit, protonix, sucralfate, IV abx  Meal Completion: 0-50%  Medications reviewed and include: Ensure Enlive BID,   Labs reviewed: sodium 131, BUN 48, creatinine 1.67 CBG's: 115-165 x 24 hours  UOP: 800 ml x 24 hours I/O's: +8.1 L since admit  Diet Order:   Diet Order             Diet regular Room service appropriate? Yes with Assist; Fluid consistency: Thin  Diet effective now                   EDUCATION NEEDS:   Not appropriate for education at this time  Skin:  Skin Assessment: Skin Integrity Issues: Stage I: L lip (MDRI) Stage II: bilateral buttocks  Last BM:  01/03/23  Height:   Ht Readings from Last 1 Encounters:  12/27/22 5\' 8"  (1.727 m)    Weight:   Wt Readings from Last 1 Encounters:  01/06/23 74.1 kg    BMI:  Body mass index is 24.84 kg/m.  Estimated Nutritional Needs:   Kcal:  1750-1950  Protein:  90-110 grams  Fluid:  1.7-1.9 L    Mertie Clause,  MS, RD, LDN Inpatient Clinical Dietitian Please see AMiON for contact information.

## 2023-01-06 NOTE — Progress Notes (Signed)
Pharmacy Antibiotic Note  Matthew Zamora is a 82 y.o. male admitted on 12/29/2022 with bacteremia.  He was initially started on vancomycin and cefepime at [facility]. Pharmacy has been consulted to re-initiate vancomycin for MRSA bacteremia.   Pt with complicated MRSA bacteremia due to ICD. Planning for TEE to determine LOT.   Scr 1.45  Plan: Continue vancomycin 1000mg  IV Q24H.  Check another trough this week  Weight: 74.1 kg (163 lb 5.8 oz)  Temp (24hrs), Avg:97.7 F (36.5 C), Min:97.6 F (36.4 C), Max:97.8 F (36.6 C)  Recent Labs  Lab 12/31/22 0519 01/01/23 0113 01/02/23 0643 01/02/23 2012 01/03/23 0450 01/04/23 1011  WBC 9.4 11.3* 11.8*  --  11.4* 9.9  CREATININE 1.75* 1.63* 1.64*  --  1.39* 1.45*  VANCOTROUGH  --   --   --  18  --   --     Estimated Creatinine Clearance: 39.3 mL/min (A) (by C-G formula based on SCr of 1.45 mg/dL (H)).    Allergies  Allergen Reactions   Nitrofuran Derivatives Other (See Comments)    Transaminitis ** confounded w/Amiodarone, Mexiletine   Amiodarone Other (See Comments)    Unknown    Spironolactone Other (See Comments)    Significant transaminitis     Vanc 7/13, 7/14 >> Cefepime x1 7/13  7/13 Vanc 1500mg  at 1437 (SCr 1.76) 7/14 VR = 10 mcg/mL (30 hrs from loading dose) >> 1250mg  at 2100 (SCr 2.12) 7/18 Vanc trough = 18 mcg/ml (THERAPEUTIC)  7/13 BCx - MRSA  7/14 MRSA PCR - positive 7/16 BCx - 1/4 +MRSA 7/18 Bcx ngtd  Ulyses Southward, PharmD, [place], AAHIVP, CPP Infectious Disease Pharmacist 01/06/2023 7:58 AM

## 2023-01-07 ENCOUNTER — Other Ambulatory Visit: Payer: Self-pay

## 2023-01-07 ENCOUNTER — Encounter (HOSPITAL_COMMUNITY): Payer: Self-pay | Admitting: Neurology

## 2023-01-07 DIAGNOSIS — I63232 Cerebral infarction due to unspecified occlusion or stenosis of left carotid arteries: Secondary | ICD-10-CM

## 2023-01-07 DIAGNOSIS — B9562 Methicillin resistant Staphylococcus aureus infection as the cause of diseases classified elsewhere: Secondary | ICD-10-CM | POA: Diagnosis not present

## 2023-01-07 DIAGNOSIS — R531 Weakness: Secondary | ICD-10-CM

## 2023-01-07 DIAGNOSIS — R7881 Bacteremia: Secondary | ICD-10-CM | POA: Diagnosis not present

## 2023-01-07 DIAGNOSIS — E43 Unspecified severe protein-calorie malnutrition: Secondary | ICD-10-CM

## 2023-01-07 LAB — BASIC METABOLIC PANEL
Anion gap: 10 (ref 5–15)
BUN: 67 mg/dL — ABNORMAL HIGH (ref 8–23)
CO2: 21 mmol/L — ABNORMAL LOW (ref 22–32)
Calcium: 8.8 mg/dL — ABNORMAL LOW (ref 8.9–10.3)
Chloride: 98 mmol/L (ref 98–111)
Creatinine, Ser: 1.88 mg/dL — ABNORMAL HIGH (ref 0.61–1.24)
GFR, Estimated: 36 mL/min — ABNORMAL LOW (ref >60)
Glucose, Bld: 96 mg/dL (ref 70–99)
Potassium: 5.3 mmol/L — ABNORMAL HIGH (ref 3.5–5.1)
Sodium: 129 mmol/L — ABNORMAL LOW (ref 135–145)

## 2023-01-07 LAB — GLUCOSE, CAPILLARY
Glucose-Capillary: 102 mg/dL — ABNORMAL HIGH (ref 70–99)
Glucose-Capillary: 124 mg/dL — ABNORMAL HIGH (ref 70–99)
Glucose-Capillary: 128 mg/dL — ABNORMAL HIGH (ref 70–99)
Glucose-Capillary: 130 mg/dL — ABNORMAL HIGH (ref 70–99)
Glucose-Capillary: 91 mg/dL (ref 70–99)
Glucose-Capillary: 97 mg/dL (ref 70–99)

## 2023-01-07 LAB — CULTURE, BLOOD (ROUTINE X 2)

## 2023-01-07 LAB — CK: Total CK: 20 U/L — ABNORMAL LOW (ref 49–397)

## 2023-01-07 MED ORDER — ATORVASTATIN CALCIUM 10 MG PO TABS
20.0000 mg | ORAL_TABLET | Freq: Every day | ORAL | Status: DC
  Administered 2023-01-07 – 2023-01-12 (×6): 20 mg via ORAL
  Filled 2023-01-07 (×6): qty 2

## 2023-01-07 MED ORDER — SODIUM CHLORIDE 0.9 % IV SOLN
8.0000 mg/kg | Freq: Every day | INTRAVENOUS | Status: DC
  Administered 2023-01-07: 600 mg via INTRAVENOUS
  Filled 2023-01-07 (×2): qty 12

## 2023-01-07 NOTE — Progress Notes (Addendum)
PROGRESS NOTE    Matthew Zamora  GNF:[id number] DOB: 1940-07-17 DOA: 12/29/2022 PCP: Jerl Mina, MD   Brief Narrative:    Patient is an 82 year old male with past medical history significant for refractory orthostatic hypotension, CHF with reduced EF, chronic nausea and vomiting.  Patient initially presented to [facility] ED with acute shortness of breath chest pain.  Initial evaluation showed acute CHF complicated by atrial fibrillation with RVR.  Initially hypotensive, resuscitated with fluid and started on amiodarone drip.  Patient spiked a fever patient was started on broad-spectrum antibiotics.  Blood cultures eventually grew MRSA and respiratory viral panel was positive for rhinovirus.  On 7/14 patient also suffered from acute right-sided weakness with aphasia.  CT angio showed acute occlusion of left ICA.  Neurology and vascular were consulted and patient was transferred to [facility] for neuroIR eval.  Patient underwent left carotid stent placement and post procedure transferred to the ICU.  Patient has been on IV vancomycin since 12/28/2022.  Infectious disease team was consulted to direct antibiotics management.  Patient has not been able to undergo TEE.  The plan is to continue IV vancomycin for 6 weeks and then doxycycline for life (i.e. depending on goals of care).  Alternatively, and also depending on goals of care, patient can be changed to Zyvox for 2 weeks and then doxycycline for life.  Patient has an AICD in situ.  For now, due to inability to perform TEE to date, it is assumed that the AICD/ICD leads are infected, unless TEE is done to rule out above.  EP now discussing the possibility of removing AICD.  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 -Neurology and IR planning to continue Eliquis, Brilinta.   -Continue statin.  PT/OT-SNF   MRSA bacteremia, source from cardiac device: Septic shock, resolved: -Repeat cultures have been negative so far. -See above documentation regarding duration of IV vancomycin versus Zyvox.   -Input from ID team is highly appreciated.   -Palliative care team's input is also highly appreciated.   -If TEE is not performed, completed 6 weeks of IV vancomycin, then vancomycin for life (i.e. depending on goals of care).  Zyvox is an option as documented above.  -CT scan does not show any evidence of cervical/thoracic discitis.   -Patient has an ICD in place with concerns of vegetation.  Currently no plans for extraction but likely will need TEE Per EP cardiology, patient might be a candidate for extraction of device given his clinical improvement.  Further discussion pending regarding timing in the setting of anticoagulation.   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 -Continue to optimize.   Atrial fibrillation with RVR Rate is still elevated, on metoprolol 50 mg twice daily.  IV as needed. Eliquis BID Consult cardiology We may need to restart amiodarone drip 01/05/2023: Heart rate is currently controlled.  Patient is on Eliquis.   Severe nutritional deficiency - Cortrak in place, getting tube feeds.  Monitor for refeeding syndrome.  As we approach goal rate and if patient would like p.o. intake, we can slowly transition to nocturnal tube feeds 01/03/2023: Continue NG tube feeds.   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  No evidence of discitis seen on CT.  CK normal.  Pain control, physical therapy.  Voltaren gel   Acute on chronic CKD stage IIIb NAGMA Hyponatremia, improving Baseline creatinine around 1.5, admission creatinine 2.12.   Slowly improving at this time.  Monitor urine output. 01/03/2023: Improvement in serum creatinine noted (1.64-1.39).   Normocytic anemia, likely anemia of chronic disease Hemoglobin stable at 8.3   Thrombocytopenia Platelets had drifted down to 92 now slowly recovering    GERD -PPI   Chronic hypotension On midodrine 30 mg 3 times daily, Mestinon   Depression/anxiety -Continue home sertraline 25 mg daily    Acute metabolic encephalopathy secondary to sepsis, improved Improved   DVT prophylaxis: Eliquis Code Status: Full code Family Communication: None at bedside Chronically ill patient with MRSA bacteremia and multiple ongoing issues.  Will be in the hospital for least next 3-5 days     Consultants:  ID EP Palliative care  Procedures:  None  Antimicrobials:  Anti-infectives (From admission, onward)    Start     Dose/Rate Route Frequency Ordered Stop   01/07/23 1400  DAPTOmycin (CUBICIN) 600 mg in sodium chloride 0.9 % IVPB        8 mg/kg  75.8 kg 124 mL/hr over 30 Minutes Intravenous Daily 01/07/23 0859     12/30/22 2100  vancomycin (VANCOREADY) IVPB 750 mg/150 mL  Status:  Discontinued        750 mg 150 mL/hr over 60 Minutes Intravenous Every 24 hours 12/30/22 1037 12/30/22 1427   12/30/22 2100  vancomycin (VANCOCIN) IVPB 1000 mg/200 mL premix  Status:  Discontinued        1,000 mg 200 mL/hr over 60 Minutes Intravenous Every 24 hours 12/30/22 1427 01/07/23 0859   12/29/22 2000  vancomycin (VANCOREADY) IVPB 1250 mg/250 mL        1,250 mg 166.7 mL/hr over 90 Minutes Intravenous  Once 12/29/22 1928 12/30/22 0154   12/29/22 1917  vancomycin variable dose per unstable renal function (pharmacist dosing)  Status:  Discontinued         Does not apply See admin instructions 12/29/22 1928 12/30/22 1037      Subjective: Patient seen and evaluated today with no new acute complaints or concerns. No acute concerns or events noted overnight.  Objective: Vitals:    01/07/23 0341 01/07/23 0723 01/07/23 0743 01/07/23 1100  BP: 120/60  130/70 127/61  Pulse: 93  (!) 109 99  Resp: 20  20 16   Temp: 97.6 F (36.4 C)  (!) 97.4 F (36.3 C) 98.6 F (37 C)  TempSrc: Oral  Oral Oral  SpO2: 100% 100% 95% 94%  Weight: 75.8 kg       Intake/Output Summary (Last 24 hours) at 01/07/2023 1141 Last data filed at 01/07/2023 0800 Gross per 24 hour  Intake 2013.39 ml  Output 750 ml  Net 1263.39 ml   Filed Weights   01/05/23 0322 01/06/23 0329 01/07/23 0341  Weight: 73.5 kg 74.1 kg 75.8 kg    Examination:  General exam: Appears calm and comfortable  Respiratory system: Clear to auscultation. Respiratory effort normal. Cardiovascular system: S1 & S2 heard, RRR.  Gastrointestinal system: Abdomen is soft Central nervous system: Alert and awake Extremities: No edema Skin: No significant lesions noted Psychiatry: Flat affect.    Data Reviewed: I have personally reviewed following labs and imaging studies  CBC: Recent Labs  Lab 01/01/23 0113 01/02/23 0643 01/03/23 0450 01/04/23 1011  WBC 11.3* 11.8* 11.4* 9.9  NEUTROABS  --   --   --  8.5*  HGB 8.1* 8.3* 8.7* 7.5*  HCT 26.7* 27.1* 29.1* 24.1*  MCV 96.7 93.1 93.6 93.1  PLT 120* 167 207 230   Basic Metabolic Panel: Recent Labs  Lab 01/01/23 1734 01/02/23 0643 01/02/23 1734 01/03/23 0450 01/04/23 1011 01/06/23 0817 01/07/23 0809  NA  --  135  --  138 137 131* 129*  K  --  4.1  --  4.4 4.2 4.8 5.3*  CL  --  110  --  110 103 102 98  CO2  --  19*  --  20* 19* 23 21*  GLUCOSE  --  117*  --  99 146* 152* 96  BUN  --  23  --  26* 31* 48* 67*  CREATININE  --  1.64*  --  1.39* 1.45* 1.67* 1.88*  CALCIUM  --  8.6*  --  9.1 8.7* 8.5* 8.8*  MG 2.0 2.0 1.8 2.0 1.9  --   --   PHOS 2.6 2.3* 2.1* 2.4*  --   --   --    GFR: Estimated Creatinine Clearance: 30.3 mL/min (A) (by C-G formula based on SCr of 1.88 mg/dL (H)). Liver Function Tests: Recent Labs  Lab 01/06/23 0817  AST 30  ALT 39  ALKPHOS  75  BILITOT 0.4  PROT 5.3*  ALBUMIN 2.1*   No results for input(s): "LIPASE", "AMYLASE" in the last 168 hours. No results for input(s): "AMMONIA" in the last 168 hours. Coagulation Profile: No results for input(s): "INR", "PROTIME" in the last 168 hours. Cardiac Enzymes: Recent Labs  Lab 01/07/23 0809  CKTOTAL 20*   BNP (last 3 results) No results for input(s): "PROBNP" in the last 8760 hours. HbA1C: No results for input(s): "HGBA1C" in the last 72 hours. CBG: Recent Labs  Lab 01/06/23 1935 01/06/23 2340 01/07/23 0334 01/07/23 0746 01/07/23 1100  GLUCAP 142* 145* 130* 97 102*   Lipid Profile: No results for input(s): "CHOL", "HDL", "LDLCALC", "TRIG", "CHOLHDL", "LDLDIRECT" in the last 72 hours. Thyroid Function Tests: No results for input(s): "TSH", "T4TOTAL", "FREET4", "T3FREE", "THYROIDAB" in the last 72 hours. Anemia Panel: No results for input(s): "VITAMINB12", "FOLATE", "FERRITIN", "TIBC", "IRON", "RETICCTPCT" in the last 72 hours. Sepsis Labs: No results for input(s): "PROCALCITON", "LATICACIDVEN" in the last 168 hours.  Recent Results (from the past 240 hour(s))  Respiratory (~20 pathogens) panel by PCR     Status: None   Collection Time: 12/28/22 11:55 AM   Specimen: Nasopharyngeal Swab; Respiratory  Result Value Ref Range Status   Adenovirus NOT DETECTED NOT DETECTED Final   Coronavirus 229E NOT DETECTED NOT DETECTED Final    Comment: (NOTE) The Coronavirus on the Respiratory Panel, DOES NOT test for the novel  Coronavirus (2019 nCoV)    Coronavirus HKU1 NOT DETECTED NOT DETECTED Final   Coronavirus NL63 NOT DETECTED NOT DETECTED Final   Coronavirus OC43 NOT DETECTED NOT DETECTED Final   Metapneumovirus NOT DETECTED NOT DETECTED Final   Rhinovirus / Enterovirus NOT DETECTED NOT DETECTED Final   Influenza A NOT DETECTED NOT DETECTED Final   Influenza B NOT DETECTED NOT DETECTED Final   Parainfluenza Virus 1 NOT DETECTED NOT DETECTED Final    Parainfluenza Virus 2 NOT DETECTED NOT DETECTED Final   Parainfluenza Virus 3 NOT DETECTED NOT DETECTED Final   Parainfluenza Virus 4 NOT DETECTED NOT DETECTED Final   Respiratory Syncytial Virus NOT DETECTED NOT DETECTED Final   Bordetella pertussis NOT DETECTED NOT DETECTED  Final   Bordetella Parapertussis NOT DETECTED NOT DETECTED Final   Chlamydophila pneumoniae NOT DETECTED NOT DETECTED Final   Mycoplasma pneumoniae NOT DETECTED NOT DETECTED Final    Comment: Performed at [facility], [address], [place], [place] [address]  SARS Coronavirus 2 by RT PCR (hospital order, performed in [facility] hospital lab) *cepheid single result test* Anterior Nasal Swab     Status: None   Collection Time: 12/29/22  1:06 PM   Specimen: Anterior Nasal Swab  Result Value Ref Range Status   SARS Coronavirus 2 by RT PCR NEGATIVE NEGATIVE Final    Comment: Performed at [facility], [address] [place], [place] [address]  MRSA Next Gen by PCR, Nasal     Status: Abnormal   Collection Time: 12/29/22  6:50 PM   Specimen: Nasal Mucosa; Nasal Swab  Result Value Ref Range Status   MRSA by PCR Next Gen DETECTED (A) NOT DETECTED Final    Comment: RESULT CALLED TO, READ BACK BY AND VERIFIED WITH: S. SAVAGE  RN 12/29/22 @ 2126 BY AB (NOTE) The GeneXpert MRSA Assay (FDA approved for NASAL specimens only), is one component of a comprehensive MRSA colonization surveillance program. It is not intended to diagnose MRSA infection nor to guide or monitor treatment for MRSA infections. Test performance is not FDA approved in patients less than 52 years old. Performed at [facility], [address] [place], [place] [address]   Culture, blood (Routine X 2) w Reflex to ID Panel     Status: Abnormal   Collection Time: 12/31/22  7:37 AM   Specimen: BLOOD RIGHT HAND  Result Value Ref Range Status   Specimen Description BLOOD RIGHT HAND  Final   Special Requests   Final    BOTTLES DRAWN  AEROBIC AND ANAEROBIC Blood Culture adequate volume   Culture  Setup Time   Final    GRAM POSITIVE COCCI IN CLUSTERS ANAEROBIC BOTTLE ONLY CRITICAL RESULT CALLED TO, READ BACK BY AND VERIFIED WITH: PHARMD J. LEDFORD 01/01/23 @ 0330 BY AB Performed at [facility], [address], [place], [place] [address]    Culture METHICILLIN RESISTANT STAPHYLOCOCCUS AUREUS (A)  Final   Report Status 01/03/2023 FINAL  Final   Organism ID, Bacteria METHICILLIN RESISTANT STAPHYLOCOCCUS AUREUS  Final      Susceptibility   Methicillin resistant staphylococcus aureus - MIC*    CIPROFLOXACIN >=8 RESISTANT Resistant     ERYTHROMYCIN >=8 RESISTANT Resistant     GENTAMICIN <=0.5 SENSITIVE Sensitive     OXACILLIN >=4 RESISTANT Resistant     TETRACYCLINE <=1 SENSITIVE Sensitive     VANCOMYCIN 1 SENSITIVE Sensitive     TRIMETH/SULFA >=320 RESISTANT Resistant     CLINDAMYCIN <=0.25 SENSITIVE Sensitive     RIFAMPIN <=0.5 SENSITIVE Sensitive     Inducible Clindamycin NEGATIVE Sensitive     LINEZOLID 2 SENSITIVE Sensitive     * METHICILLIN RESISTANT STAPHYLOCOCCUS AUREUS  Blood Culture ID Panel (Reflexed)     Status: Abnormal   Collection Time: 12/31/22  7:37 AM  Result Value Ref Range Status   Enterococcus faecalis NOT DETECTED NOT DETECTED Final   Enterococcus Faecium NOT DETECTED NOT DETECTED Final   Listeria monocytogenes NOT DETECTED NOT DETECTED Final   Staphylococcus species DETECTED (A) NOT DETECTED Final    Comment: CRITICAL RESULT CALLED TO, READ BACK BY AND VERIFIED WITH: PHARMD J. LEDFORD 01/01/23 @ 0330 BY AB    Staphylococcus aureus (  BCID) DETECTED (A) NOT DETECTED Final    Comment: Methicillin (oxacillin)-resistant Staphylococcus aureus (MRSA). MRSA is predictably resistant to beta-lactam antibiotics (except ceftaroline). Preferred therapy is vancomycin unless clinically contraindicated. Patient requires contact precautions if  hospitalized. CRITICAL RESULT CALLED TO, READ BACK BY AND  VERIFIED WITH: PHARMD J. LEDFORD 01/01/23 @ 0330 BY AB    Staphylococcus epidermidis NOT DETECTED NOT DETECTED Final   Staphylococcus lugdunensis NOT DETECTED NOT DETECTED Final   Streptococcus species NOT DETECTED NOT DETECTED Final   Streptococcus agalactiae NOT DETECTED NOT DETECTED Final   Streptococcus pneumoniae NOT DETECTED NOT DETECTED Final   Streptococcus pyogenes NOT DETECTED NOT DETECTED Final   A.calcoaceticus-baumannii NOT DETECTED NOT DETECTED Final   Bacteroides fragilis NOT DETECTED NOT DETECTED Final   Enterobacterales NOT DETECTED NOT DETECTED Final   Enterobacter cloacae complex NOT DETECTED NOT DETECTED Final   Escherichia coli NOT DETECTED NOT DETECTED Final   Klebsiella aerogenes NOT DETECTED NOT DETECTED Final   Klebsiella oxytoca NOT DETECTED NOT DETECTED Final   Klebsiella pneumoniae NOT DETECTED NOT DETECTED Final   Proteus species NOT DETECTED NOT DETECTED Final   Salmonella species NOT DETECTED NOT DETECTED Final   Serratia marcescens NOT DETECTED NOT DETECTED Final   Haemophilus influenzae NOT DETECTED NOT DETECTED Final   Neisseria meningitidis NOT DETECTED NOT DETECTED Final   Pseudomonas aeruginosa NOT DETECTED NOT DETECTED Final   Stenotrophomonas maltophilia NOT DETECTED NOT DETECTED Final   Candida albicans NOT DETECTED NOT DETECTED Final   Candida auris NOT DETECTED NOT DETECTED Final   Candida glabrata NOT DETECTED NOT DETECTED Final   Candida krusei NOT DETECTED NOT DETECTED Final   Candida parapsilosis NOT DETECTED NOT DETECTED Final   Candida tropicalis NOT DETECTED NOT DETECTED Final   Cryptococcus neoformans/gattii NOT DETECTED NOT DETECTED Final   Meth resistant mecA/C and MREJ DETECTED (A) NOT DETECTED Final    Comment: CRITICAL RESULT CALLED TO, READ BACK BY AND VERIFIED WITH: PHARMD J. LEDFORD 01/01/23 @ 0330 BY AB Performed at [facility], [address], [place], [place] [address]   Culture, blood (Routine X 2) w Reflex to ID  Panel     Status: None   Collection Time: 12/31/22  7:43 AM   Specimen: BLOOD RIGHT HAND  Result Value Ref Range Status   Specimen Description BLOOD RIGHT HAND  Final   Special Requests   Final    BOTTLES DRAWN AEROBIC ONLY Blood Culture adequate volume   Culture   Final    NO GROWTH 5 DAYS Performed at [facility], [address], [place], [place] [address]    Report Status 01/05/2023 FINAL  Final  Culture, blood (Routine X 2) w Reflex to ID Panel     Status: None   Collection Time: 01/02/23  3:51 PM   Specimen: BLOOD LEFT HAND  Result Value Ref Range Status   Specimen Description BLOOD LEFT HAND  Final   Special Requests   Final    BOTTLES DRAWN AEROBIC AND ANAEROBIC Blood Culture adequate volume   Culture   Final    NO GROWTH 5 DAYS Performed at [facility], [address] [place], [place] [address]    Report Status 01/07/2023 FINAL  Final  Culture, blood (Routine X 2) w Reflex to ID Panel     Status: None   Collection Time: 01/02/23  3:51 PM   Specimen: BLOOD RIGHT HAND  Result Value Ref Range Status   Specimen Description BLOOD RIGHT  HAND  Final   Special Requests   Final    BOTTLES DRAWN AEROBIC ONLY Blood Culture results may not be optimal due to an inadequate volume of blood received in culture bottles   Culture   Final    NO GROWTH 5 DAYS Performed at [facility], [address], [place], [place] [address]    Report Status 01/07/2023 FINAL  Final         Radiology Studies: No results found.      Scheduled Meds:  acetaminophen  1,000 mg Oral TID   amiodarone  400 mg Oral BID   apixaban  5 mg Oral BID   atorvastatin  20 mg Oral QHS   budesonide (PULMICORT) nebulizer solution  0.25 mg Nebulization BID   diclofenac Sodium  4 g Topical QID   feeding supplement  237 mL Oral BID BM   feeding supplement (OSMOLITE 1.5 CAL)  1,000 mL Per Tube Q24H   feeding supplement (PROSource TF20)  60 mL Per Tube Daily   gabapentin  100 mg Oral QHS    ipratropium-albuterol  3 mL Nebulization BID   lidocaine  2 patch Transdermal Q24H   megestrol  400 mg Oral BID   metoprolol tartrate  75 mg Oral BID   midodrine  5 mg Oral TID WC   multivitamin  1 tablet Oral QHS   pantoprazole  40 mg Oral BID   pyridostigmine  60 mg Oral Q8H   sertraline  25 mg Oral Daily   sucralfate  1 g Oral TID WC & HS   ticagrelor  90 mg Oral BID   Or   ticagrelor  90 mg Per Tube BID   Continuous Infusions:  sodium chloride Stopped (01/06/23 0517)   DAPTOmycin (CUBICIN) 600 mg in sodium chloride 0.9 % IVPB       LOS: 9 days    Time spent: 35 minutes    Joon Pohle Hoover Brunette, DO [facility]  If 7PM-7AM, please contact night-coverage [email or web address] 01/07/2023, 11:41 AM

## 2023-01-07 NOTE — Progress Notes (Signed)
Occupational Therapy Treatment Patient Details Name: Matthew Zamora MRN: [id number] DOB: [birth year] Today's Date: 01/07/2023   History of present illness 82 yo male admitted to [facility] 7/12 with SOB with decompensated HF. 7/13 hypotension with sepsis. 7/14 Rt weakness with code stroke and transfer to [facility], s/p Lt ICA thrombectomy with residual stenosis ultimately requiring left ICA stent placement.  Extubated post procedure but very confused. PMhx: COPD, CHF, AFib, CKD, HTN   OT comments  Patient with incremental progress toward patient focused goals.  Patient continues to be very anxious, and complains of shoulder pain.  Able to sit EOB for 5 min to complete light grooming with Min A for thoroughness.  OT can continue to follow in the acute setting to address deficits and Patient will benefit from continued inpatient follow up therapy, <3 hours/day    Recommendations for follow up therapy are one component of a multi-disciplinary discharge planning process, led by the attending physician.  Recommendations may be updated based on patient status, additional functional criteria and insurance authorization.    Assistance Recommended at Discharge Frequent or constant Supervision/Assistance  Patient can return home with the following  Two people to help with walking and/or transfers;A lot of help with bathing/dressing/bathroom;Assistance with cooking/housework;Direct supervision/assist for financial management;Direct supervision/assist for medications management;Assist for transportation;Help with stairs or ramp for entrance   Equipment Recommendations  Wheelchair (measurements OT);Wheelchair cushion (measurements OT);Hospital bed    Recommendations for Other Services      Precautions / Restrictions Precautions Precautions: Fall;Other (comment) Precaution Comments: very anxious, shoulder pain Restrictions Weight Bearing Restrictions: No       Mobility Bed Mobility Overal bed mobility:  Needs Assistance Bed Mobility: Supine to Sit, Sit to Supine     Supine to sit: Mod assist, Max assist Sit to supine: Max assist     Patient Response: Cooperative  Transfers                         Balance Overall balance assessment: Needs assistance Sitting-balance support: Feet supported Sitting balance-Leahy Scale: Fair   Postural control: Posterior lean                                 ADL either performed or assessed with clinical judgement   ADL       Grooming: Wash/dry hands;Wash/dry face;Minimal assistance;Sitting           Upper Body Dressing : Moderate assistance;Bed level   Lower Body Dressing: Maximal assistance;Bed level                      Extremity/Trunk Assessment Upper Extremity Assessment Upper Extremity Assessment: Generalized weakness RUE Deficits / Details: Improving AROM, able to bring his hand to his forehead.  Increased brusing and swelling RUE: Shoulder pain with ROM;Shoulder pain at rest RUE Sensation: WNL RUE Coordination: WNL LUE Deficits / Details: Improving AROM, able to bring his hand to his forehead. Increased brusing and swelling LUE: Shoulder pain with ROM;Shoulder pain at rest LUE Sensation: WNL LUE Coordination: WNL   Lower Extremity Assessment Lower Extremity Assessment: Defer to PT evaluation        Vision   Vision Assessment?: No apparent visual deficits   Perception Perception Perception: Not tested   Praxis Praxis Praxis: Not tested    Cognition Arousal/Alertness: Awake/alert Behavior During Therapy: Restless, Anxious Overall Cognitive Status: No family/caregiver present to determine baseline  cognitive functioning                       Memory: Decreased short-term memory Following Commands: Follows one step commands with increased time Safety/Judgement: Decreased awareness of safety, Decreased awareness of deficits Awareness: Intellectual Problem Solving: Slow  processing, Requires verbal cues, Requires tactile cues          Exercises      Shoulder Instructions       General Comments  95% on RA    Pertinent Vitals/ Pain       Pain Assessment Pain Assessment: Faces Faces Pain Scale: Hurts even more Pain Location: bilateral shoulders Pain Descriptors / Indicators: Grimacing, Guarding, Moaning Pain Intervention(s): Monitored during session                                                          Frequency  Min 2X/week        Progress Toward Goals  OT Goals(current goals can now be found in the care plan section)  Progress towards OT goals: Progressing toward goals  Acute Rehab OT Goals OT Goal Formulation: With patient Time For Goal Achievement: 01/13/23 Potential to Achieve Goals: Fair  Plan Discharge plan remains appropriate    Co-evaluation                 AM-PAC OT "6 Clicks" Daily Activity     Outcome Measure   Help from another person eating meals?: A Lot Help from another person taking care of personal grooming?: A Little Help from another person toileting, which includes using toliet, bedpan, or urinal?: A Lot Help from another person bathing (including washing, rinsing, drying)?: A Lot Help from another person to put on and taking off regular upper body clothing?: A Lot Help from another person to put on and taking off regular lower body clothing?: A Lot 6 Click Score: 13    End of Session Equipment Utilized During Treatment: Rolling walker (2 wheels);Oxygen  OT Visit Diagnosis: Unsteadiness on feet (R26.81);Muscle weakness (generalized) (M62.81);History of falling (Z91.81);Pain Pain - Right/Left: Right Pain - part of body: Shoulder   Activity Tolerance Patient limited by pain   Patient Left in bed;with call bell/phone within reach;with bed alarm set   Nurse Communication Mobility status        Time: 6578-4696 OT Time Calculation (min): 20 min  Charges: OT  General Charges $OT Visit: 1 Visit OT Treatments $Self Care/Home Management : 8-22 mins  01/07/2023  RP, OTR/L  Acute Rehabilitation Services  Office:  [phone]   Suzanna Obey 01/07/2023, 9:56 AM

## 2023-01-07 NOTE — Progress Notes (Signed)
Pharmacy Antibiotic Note - Follow-Up  Matthew Zamora is a 82 y.o. male admitted on 12/29/2022 with bacteremia.  He was initially started on vancomycin and cefepime at [facility]. Pharmacy was previously consulted to manage vancomycin but after discussing with MD, patient has been switched to daptomycin (per ID recommendations) due to poor renal function.  Per MD okay, will decrease patient's atorvastatin to decrease the risk of rhabdo. Plan to obtain CK level more often due to this DDI.  Plan: Discontinue vancomycin Initiate daptomycin 600mg  (8mg /kg) IV Q24H Decrease atorvastatin 80mg  to 20mg  daily Obtain CK & BMET q72h Follow-up repeat blood cultures, renal function, & GOC  Weight: 75.8 kg (167 lb 1.7 oz)  Temp (24hrs), Avg:97.9 F (36.6 C), Min:97.4 F (36.3 C), Max:98.9 F (37.2 C)  Recent Labs  Lab 01/01/23 0113 01/02/23 0643 01/02/23 2012 01/03/23 0450 01/04/23 1011 01/06/23 0817  WBC 11.3* 11.8*  --  11.4* 9.9  --   CREATININE 1.63* 1.64*  --  1.39* 1.45* 1.67*  VANCOTROUGH  --   --  18  --   --   --     Estimated Creatinine Clearance: 34.1 mL/min (A) (by C-G formula based on SCr of 1.67 mg/dL (H)).    Allergies  Allergen Reactions   Nitrofuran Derivatives Other (See Comments)    Transaminitis ** confounded w/Amiodarone, Mexiletine   Amiodarone Other (See Comments)    Unknown    Spironolactone Other (See Comments)    Significant transaminitis     Vanc 7/13 >> 7/22 Cefepime x1 7/13  7/13 Vanc 1500mg  at 1437 (SCr 1.76) 7/14 VR = 10 mcg/mL (30 hrs from loading dose) >> 1250mg  at 2100 (SCr 2.12) 7/18 Vanc trough = 18 mcg/ml (THERAPEUTIC)  7/13 BCx - MRSA  7/14 MRSA PCR - positive 7/16 BCx - 1/4 +MRSA 7/18 Bcx ngtd4  Nicole Kindred, PharmD PGY1 Pharmacy Resident 01/07/2023 9:06 AM

## 2023-01-07 NOTE — Plan of Care (Signed)
Problem: Fluid Volume: Goal: Hemodynamic stability will improve 01/07/2023 0523 by Billey Gosling D, RN Outcome: Progressing 01/07/2023 0523 by Excell Seltzer, Josey Forcier D, RN Outcome: Progressing   Problem: Clinical Measurements: Goal: Diagnostic test results will improve 01/07/2023 0523 by Billey Gosling D, RN Outcome: Progressing 01/07/2023 0523 by Excell Seltzer, Fatim Vanderschaaf D, RN Outcome: Progressing Goal: Signs and symptoms of infection will decrease 01/07/2023 0523 by Billey Gosling D, RN Outcome: Progressing 01/07/2023 0523 by Excell Seltzer, Sulo Janczak D, RN Outcome: Progressing   Problem: Respiratory: Goal: Ability to maintain adequate ventilation will improve 01/07/2023 0523 by Billey Gosling D, RN Outcome: Progressing 01/07/2023 0523 by Excell Seltzer, Tywanda Rice D, RN Outcome: Progressing   Problem: Education: Goal: Knowledge of General Education information will improve Description: Including pain rating scale, medication(s)/side effects and non-pharmacologic comfort measures 01/07/2023 0523 by Billey Gosling D, RN Outcome: Progressing 01/07/2023 0523 by Excell Seltzer, Eunice Winecoff D, RN Outcome: Progressing   Problem: Health Behavior/Discharge Planning: Goal: Ability to manage health-related needs will improve 01/07/2023 0523 by Billey Gosling D, RN Outcome: Progressing 01/07/2023 0523 by Excell Seltzer, Katryn Plummer D, RN Outcome: Progressing   Problem: Clinical Measurements: Goal: Ability to maintain clinical measurements within normal limits will improve 01/07/2023 0523 by Excell Seltzer, Shaylen Nephew D, RN Outcome: Progressing 01/07/2023 0523 by Excell Seltzer, Rever Pichette D, RN Outcome: Progressing Goal: Will remain free from infection 01/07/2023 0523 by Excell Seltzer, Neeley Sedivy D, RN Outcome: Progressing 01/07/2023 0523 by Excell Seltzer Ferman Basilio D, RN Outcome: Progressing Goal: Diagnostic test results will improve 01/07/2023 0523 by Billey Gosling D, RN Outcome: Progressing 01/07/2023 0523 by Excell Seltzer Greyden Besecker D, RN Outcome: Progressing Goal: Respiratory complications will improve 01/07/2023  0523 by Billey Gosling D, RN Outcome: Progressing 01/07/2023 0523 by Excell Seltzer Hollye Pritt D, RN Outcome: Progressing Goal: Cardiovascular complication will be avoided 01/07/2023 0523 by Billey Gosling D, RN Outcome: Progressing 01/07/2023 0523 by Billey Gosling D, RN Outcome: Progressing   Problem: Activity: Goal: Risk for activity intolerance will decrease 01/07/2023 0523 by Billey Gosling D, RN Outcome: Progressing 01/07/2023 0523 by Excell Seltzer, Consandra Laske D, RN Outcome: Progressing   Problem: Nutrition: Goal: Adequate nutrition will be maintained 01/07/2023 0523 by Billey Gosling D, RN Outcome: Progressing 01/07/2023 0523 by Excell Seltzer, Josephina Melcher D, RN Outcome: Progressing   Problem: Coping: Goal: Level of anxiety will decrease 01/07/2023 0523 by Billey Gosling D, RN Outcome: Progressing 01/07/2023 0523 by Excell Seltzer, Mersadies Petree D, RN Outcome: Progressing   Problem: Elimination: Goal: Will not experience complications related to bowel motility 01/07/2023 0523 by Billey Gosling D, RN Outcome: Progressing 01/07/2023 0523 by Excell Seltzer, Dail Meece D, RN Outcome: Progressing Goal: Will not experience complications related to urinary retention 01/07/2023 0523 by Billey Gosling D, RN Outcome: Progressing 01/07/2023 0523 by Excell Seltzer Keagan Brislin D, RN Outcome: Progressing   Problem: Pain Managment: Goal: General experience of comfort will improve 01/07/2023 0523 by Billey Gosling D, RN Outcome: Progressing 01/07/2023 0523 by Excell Seltzer, Eveleigh Crumpler D, RN Outcome: Progressing   Problem: Safety: Goal: Ability to remain free from injury will improve 01/07/2023 0523 by Excell Seltzer, Pearce Littlefield D, RN Outcome: Progressing 01/07/2023 0523 by Excell Seltzer, Carrell Palmatier D, RN Outcome: Progressing   Problem: Skin Integrity: Goal: Risk for impaired skin integrity will decrease 01/07/2023 0523 by Billey Gosling D, RN Outcome: Progressing 01/07/2023 0523 by Excell Seltzer, Pearley Millington D, RN Outcome: Progressing   Problem: Education: Goal: Understanding of CV disease, CV risk reduction, and  recovery process will improve 01/07/2023 0523 by Billey Gosling D, RN Outcome: Progressing 01/07/2023 0523 by Billey Gosling D, RN Outcome: Progressing Goal: Individualized Educational Video(s) 01/07/2023 0523 by Billey Gosling D, RN Outcome: Progressing 01/07/2023 [phone]  by Excell Seltzer, Naviyah Schaffert D, RN Outcome: Progressing   Problem: Activity: Goal: Ability to return to baseline activity level will improve 01/07/2023 0523 by Billey Gosling D, RN Outcome: Progressing 01/07/2023 0523 by Excell Seltzer, Rumaldo Difatta D, RN Outcome: Progressing   Problem: Cardiovascular: Goal: Ability to achieve and maintain adequate cardiovascular perfusion will improve 01/07/2023 0523 by Billey Gosling D, RN Outcome: Progressing 01/07/2023 0523 by Excell Seltzer, Meilyn Heindl D, RN Outcome: Progressing Goal: Vascular access site(s) Level 0-1 will be maintained 01/07/2023 0523 by Billey Gosling D, RN Outcome: Progressing 01/07/2023 0523 by Excell Seltzer, Shalay Carder D, RN Outcome: Progressing   Problem: Health Behavior/Discharge Planning: Goal: Ability to safely manage health-related needs after discharge will improve 01/07/2023 0523 by Excell Seltzer, Tianna Baus D, RN Outcome: Progressing 01/07/2023 0523 by Excell Seltzer, Earnesteen Birnie D, RN Outcome: Progressing   Problem: Education: Goal: Knowledge of disease or condition will improve 01/07/2023 0523 by Billey Gosling D, RN Outcome: Progressing 01/07/2023 0523 by Excell Seltzer, Gregoria Selvy D, RN Outcome: Progressing Goal: Knowledge of secondary prevention will improve (MUST DOCUMENT ALL) 01/07/2023 0523 by Excell Seltzer, Rheba Diamond D, RN Outcome: Progressing 01/07/2023 0523 by Excell Seltzer, Ytzel Gubler D, RN Outcome: Progressing Goal: Knowledge of patient specific risk factors will improve Loraine Leriche N/A or DELETE if not current risk factor) 01/07/2023 0523 by Billey Gosling D, RN Outcome: Progressing 01/07/2023 0523 by Excell Seltzer, Nikea Settle D, RN Outcome: Progressing   Problem: Ischemic Stroke/TIA Tissue Perfusion: Goal: Complications of ischemic stroke/TIA will be  minimized 01/07/2023 0523 by Billey Gosling D, RN Outcome: Progressing 01/07/2023 0523 by Excell Seltzer, Onedia Vargus D, RN Outcome: Progressing   Problem: Coping: Goal: Will verbalize positive feelings about self 01/07/2023 0523 by Billey Gosling D, RN Outcome: Progressing 01/07/2023 0523 by Excell Seltzer, Trenese Haft D, RN Outcome: Progressing Goal: Will identify appropriate support needs 01/07/2023 0523 by Billey Gosling D, RN Outcome: Progressing 01/07/2023 0523 by Excell Seltzer, Ingri Diemer D, RN Outcome: Progressing   Problem: Health Behavior/Discharge Planning: Goal: Ability to manage health-related needs will improve 01/07/2023 0523 by Billey Gosling D, RN Outcome: Progressing 01/07/2023 0523 by Excell Seltzer, Maize Brittingham D, RN Outcome: Progressing Goal: Goals will be collaboratively established with patient/family 01/07/2023 0523 by Billey Gosling D, RN Outcome: Progressing 01/07/2023 0523 by Excell Seltzer, Jobeth Pangilinan D, RN Outcome: Progressing   Problem: Self-Care: Goal: Ability to participate in self-care as condition permits will improve 01/07/2023 0523 by Excell Seltzer, Reshawn Ostlund D, RN Outcome: Progressing 01/07/2023 0523 by Excell Seltzer, Justeen Hehr D, RN Outcome: Progressing Goal: Verbalization of feelings and concerns over difficulty with self-care will improve 01/07/2023 0523 by Excell Seltzer, Jamorion Gomillion D, RN Outcome: Progressing 01/07/2023 0523 by Excell Seltzer, Tali Cleaves D, RN Outcome: Progressing Goal: Ability to communicate needs accurately will improve 01/07/2023 0523 by Excell Seltzer, Adaria Hole D, RN Outcome: Progressing 01/07/2023 0523 by Excell Seltzer, Raymundo Rout D, RN Outcome: Progressing   Problem: Nutrition: Goal: Risk of aspiration will decrease 01/07/2023 0523 by Billey Gosling D, RN Outcome: Progressing 01/07/2023 0523 by Excell Seltzer, Bowman Higbie D, RN Outcome: Progressing Goal: Dietary intake will improve 01/07/2023 0523 by Billey Gosling D, RN Outcome: Progressing 01/07/2023 0523 by Excell Seltzer, Carlisle Enke D, RN Outcome: Progressing   Problem: Education: Goal: Knowledge of disease or condition will  improve 01/07/2023 0523 by Billey Gosling D, RN Outcome: Progressing 01/07/2023 0523 by Excell Seltzer, Loney Peto D, RN Outcome: Progressing Goal: Knowledge of secondary prevention will improve (MUST DOCUMENT ALL) 01/07/2023 0523 by Billey Gosling D, RN Outcome: Progressing 01/07/2023 0523 by Excell Seltzer, Elice Crigger D, RN Outcome: Progressing Goal: Knowledge of patient specific risk factors will improve Loraine Leriche N/A or DELETE if not current risk factor) 01/07/2023 0523 by Billey Gosling D, RN Outcome: Progressing 01/07/2023  1610 by Excell Seltzer, Kamarian Sahakian D, RN Outcome: Progressing   Problem: Ischemic Stroke/TIA Tissue Perfusion: Goal: Complications of ischemic stroke/TIA will be minimized 01/07/2023 0523 by Billey Gosling D, RN Outcome: Progressing 01/07/2023 0523 by Excell Seltzer, Lilyth Lawyer D, RN Outcome: Progressing   Problem: Coping: Goal: Will verbalize positive feelings about self Outcome: Progressing Goal: Will identify appropriate support needs Outcome: Progressing   Problem: Health Behavior/Discharge Planning: Goal: Ability to manage health-related needs will improve Outcome: Progressing Goal: Goals will be collaboratively established with patient/family Outcome: Progressing   Problem: Self-Care: Goal: Ability to participate in self-care as condition permits will improve Outcome: Progressing Goal: Verbalization of feelings and concerns over difficulty with self-care will improve Outcome: Progressing Goal: Ability to communicate needs accurately will improve Outcome: Progressing

## 2023-01-07 NOTE — TOC Progression Note (Signed)
Transition of Care [facility]) - Progression Note    Patient Details  Name: Matthew Zamora MRN: [id number] Date of Birth: 01/10/1941  [facility] of [facility]) CM/SW Contact  Leander Rams, LCSW Phone Number: 01/07/2023, 1:24 PM  Clinical Narrative:    Pt has no current bed offers. CSW faxed out to additional facilities.   TOC will continue to follow.    Expected Discharge Plan: Skilled Nursing Facility Barriers to Discharge: Continued Medical Work up, SNF Pending bed offer  Expected Discharge Plan and Services     Post Acute Care Choice: Skilled Nursing Facility Living arrangements for the past 2 months: Single Family Home                                       Social Determinants of Health (SDOH) Interventions SDOH Screenings   Food Insecurity: No Food Insecurity (12/17/2022)  Housing: Low Risk  (12/17/2022)  Transportation Needs: No Transportation Needs (12/17/2022)  Utilities: Not At Risk (12/17/2022)  Financial Resource Strain: Low Risk  (12/05/2021)   Received from [facility]  Tobacco Use: Medium Risk (12/17/2022)    Readmission Risk Interventions    12/28/2022    9:53 AM 12/18/2022    4:18 PM 04/22/2022    3:54 PM  Readmission Risk Prevention Plan  Transportation Screening Complete Complete Complete  Medication Review Oceanographer) Complete Complete Complete  PCP or Specialist appointment within 3-5 days of discharge Complete Complete Complete  HRI or Home Care Consult Complete Complete Complete  SW Recovery Care/Counseling Consult Complete Complete Complete  Palliative Care Screening Not Applicable Not Applicable Not Applicable  Skilled Nursing Facility Complete Not Applicable Not Applicable  Oletta Lamas, MSW, LCSWA, LCASA Transitions of Care  Clinical Social Worker I

## 2023-01-07 NOTE — Plan of Care (Signed)
  Problem: Fluid Volume: Goal: Hemodynamic stability will improve Outcome: Progressing   Problem: Respiratory: Goal: Ability to maintain adequate ventilation will improve Outcome: Progressing   Problem: Clinical Measurements: Goal: Respiratory complications will improve Outcome: Progressing   Problem: Coping: Goal: Level of anxiety will decrease Outcome: Progressing

## 2023-01-07 NOTE — Progress Notes (Signed)
Pharmacist Heart Failure Core Measure Documentation  Assessment: Matthew Zamora has an EF documented as 30-35% on 12/29/22 by ECHO.  Rationale: Heart failure patients with left ventricular systolic dysfunction (LVSD) and an EF < 40% should be prescribed an angiotensin converting enzyme inhibitor (ACEI) or angiotensin receptor blocker (ARB) at discharge unless a contraindication is documented in the medical record.  This patient is not currently on an ACEI or ARB for HF.  This note is being placed in the record in order to provide documentation that a contraindication to the use of these agents is present for this encounter.  ACE Inhibitor or Angiotensin Receptor Blocker is contraindicated (specify all that apply)  []   ACEI allergy AND ARB allergy []   Angioedema []   Moderate or severe aortic stenosis []   Hyperkalemia []   Hypotension []   Renal artery stenosis [x]   Worsening renal function, preexisting renal disease or dysfunction

## 2023-01-07 NOTE — Progress Notes (Signed)
Speech Language Pathology Treatment: Cognitive-Linquistic  Patient Details Name: Matthew Zamora MRN: [id number] DOB: 10/22/1940 Today's Date: 01/07/2023 Time: 3875-6433 SLP Time Calculation (min) (ACUTE ONLY): 19 min  Assessment / Plan / Recommendation Clinical Impression  Matthew Zamora was participatory with improved orientation to elements of time/place.  Language is improved with better word-finding during conversation and fewer episodes of perseveration.  He demonstrated impaired awareness, verbalizing that he'd had a stroke but unable to identify any changes in function associated with stroke/hospitalization.  Benefited from max verbal/visual cues to problem-solve through issues related to water cup location and use of call bell/tv remote. SLP will continue to follow for cognition while admitted.   HPI HPI: 82 yo male admitted to [facility] 7/12 with SOB with decompensated HF. 7/13 hypotension with sepsis. 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  Extubated post procedure; confusion. PMhx: COPD, CHF, AFib, CKD, HTN. .      SLP Plan  Continue with current plan of care      Recommendations for follow up therapy are one component of a multi-disciplinary discharge planning process, led by the attending physician.  Recommendations may be updated based on patient status, additional functional criteria and insurance authorization.    Recommendations   Frequent supervision at d/c                  Oral care BID   Frequent or constant Supervision/Assistance Cognitive communication deficit (R41.841)     Continue with current plan of care    Matthew Matthew Zamora Frederic, MA CCC/SLP Clinical Specialist - Acute Care SLP Acute Rehabilitation Services Office number [phone]  Matthew Zamora  01/07/2023, 5:11 PM

## 2023-01-07 NOTE — Progress Notes (Signed)
Patient ID: GERRALD BASU, male   DOB: 1940-07-24, 82 y.o.   MRN: [id number]    Progress Note from the Palliative Medicine Team at [facility]   Patient Name: MEHMET SCALLY        Date: 01/07/2023 DOB: 1940-08-02  Age: 82 y.o. MRN#: [id number] Attending Physician: Erick Blinks, DO Primary Care Physician: Jerl Mina, MD Admit Date: 12/29/2022    Extensive chart review has been completed prior to meeting with patient/family  including labs, vital signs, imaging, progress/consult notes, orders, medications and available advance directive documents.    Per intake H&P --> 82 year old man with myriad of comorobidities including refractory orthostatic hypotension, HFrEF, chronic N/V who presented w/ SOB at [facility] acteremic with MRSA as well has + for rhinovirus. (+( R sided weakness w/ L ICA thrombus transferred to [facility] for emergent thrombectomy.    Palliative care has been asked to get involved in the setting of multiple readmissions, high chronic disease burden, and declined health to further discuss goals of care.    This NP assessed patient at the bedside as a follow up palliative medicine needs and emotional support.  Attempted to offer education to patient regarding current medical situation and his multiple co-morbidities.  Encouraged him to contemplate treatment option decision, advanced directive decisions and anticipatory care needs.   Although patient is alert and oriented I have concerns around his insight into the complexity of his current medical situation.  I spoke in detail with Dr. Gerrianne Scale regarding treatment options.  Cardiology is reconsidering extraction of ICD given his clinical improvement over the last 2 days.  I verbalized my concern for long-term prognosis given his multiple co-morbidities, severe and nutritional deficiencies, and overall failure to thrive.  Once decision is made regarding extraction of ICD with concerns for vegetation.  I will  reach out to family to coordinate a family meeting for further discussion regarding treatment option decisions,Advanced directive decisions and anticipatory care needs.  Education offered today regarding  the importance of continued conversation with family and their  medical providers regarding overall plan of care and treatment options,  ensuring decisions are within the context of the patients values and GOCs.  Left message voicemail for son, await call back.   Questions and concerns addressed   Discussed with Dr Sherryll Burger   Time:  50  minutes  Detailed review of medical records ( labs, imaging, vital signs), medically appropriate exam ( MS, skin, resp)   discussed with treatment team, counseling and education to patient, family, staff, documenting clinical information, medication management, coordination of care    Lorinda Creed NP  Palliative Medicine Team Team Phone # [phone] Pager [phone]

## 2023-01-07 NOTE — Progress Notes (Addendum)
Rounding Note    Patient Name: Matthew Zamora Date of Encounter: 01/07/2023  [facility] Cardiologist: None  Subjective   Continues to c/o diffuse body pain, requests to be put back on O2, (sats on RA 95%)  Inpatient Medications    Scheduled Meds:  acetaminophen  1,000 mg Oral TID   amiodarone  400 mg Oral BID   apixaban  5 mg Oral BID   atorvastatin  80 mg Oral QHS   budesonide (PULMICORT) nebulizer solution  0.25 mg Nebulization BID   diclofenac Sodium  4 g Topical QID   feeding supplement  237 mL Oral BID BM   feeding supplement (OSMOLITE 1.5 CAL)  1,000 mL Per Tube Q24H   feeding supplement (PROSource TF20)  60 mL Per Tube Daily   gabapentin  100 mg Oral QHS   ipratropium-albuterol  3 mL Nebulization BID   lidocaine  2 patch Transdermal Q24H   megestrol  400 mg Oral BID   metoprolol tartrate  75 mg Oral BID   midodrine  5 mg Oral TID WC   multivitamin  1 tablet Oral QHS   pantoprazole  40 mg Oral BID   pyridostigmine  60 mg Oral Q8H   sertraline  25 mg Oral Daily   sucralfate  1 g Oral TID WC & HS   ticagrelor  90 mg Oral BID   Or   ticagrelor  90 mg Per Tube BID   Continuous Infusions:  sodium chloride Stopped (01/06/23 0517)   vancomycin 1,000 mg (01/06/23 2111)   PRN Meds: sodium chloride, docusate sodium, hydrALAZINE, hydrocortisone, hydrocortisone cream, levalbuterol, loratadine, metoprolol tartrate, Muscle Rub, ondansetron (ZOFRAN) IV, mouth rinse, oxyCODONE, polyethylene glycol, polyvinyl alcohol, senna-docusate, sodium chloride   Vital Signs    Vitals:   01/06/23 2331 01/07/23 0341 01/07/23 0723 01/07/23 0743  BP: 109/61 120/60  130/70  Pulse: 88 93  (!) 109  Resp: 16 20  20   Temp: 97.8 F (36.6 C) 97.6 F (36.4 C)  (!) 97.4 F (36.3 C)  TempSrc: Oral Oral  Oral  SpO2: 100% 100% 100% 95%  Weight:  75.8 kg      Intake/Output Summary (Last 24 hours) at 01/07/2023 0831 Last data filed at 01/07/2023 0749 Gross per 24 hour   Intake 1953.39 ml  Output 750 ml  Net 1203.39 ml      01/07/2023    3:41 AM 01/06/2023    3:29 AM 01/05/2023    3:22 AM  Last 3 Weights  Weight (lbs) 167 lb 1.7 oz 163 lb 5.8 oz 162 lb 0.6 oz  Weight (kg) 75.8 kg 74.1 kg 73.5 kg      Telemetry    SR 70's-80's, PAFib, rare V pacing nocturnal and only when in AFib, no VT over the weekend- Personally Reviewed  ECG    No new EKGs - Personally Reviewed  Physical Exam    GEN: NAD Neck: No JVD Cardiac: irreg-irreg, no murmurs, rubs, or gallops.  Respiratory: CTA b/l (ant auscultation only). GI: Soft, nontender, non-distended  MS: RUE is lesss wollen and erythematous this AM Neuro:  AAO x2, follows commands Psych: Normal affect   Labs    High Sensitivity Troponin:   Recent Labs  Lab 12/27/22 2138 12/29/22 2233 12/30/22 0020 01/04/23 1011 01/04/23 1246  TROPONINIHS 30* 38* 38* 13 14     Chemistry Recent Labs  Lab 01/02/23 1734 01/03/23 0450 01/04/23 1011 01/06/23 0817  NA  --  138 137 131*  K  --  4.4 4.2 4.8  CL  --  110 103 102  CO2  --  20* 19* 23  GLUCOSE  --  99 146* 152*  BUN  --  26* 31* 48*  CREATININE  --  1.39* 1.45* 1.67*  CALCIUM  --  9.1 8.7* 8.5*  MG 1.8 2.0 1.9  --   PROT  --   --   --  5.3*  ALBUMIN  --   --   --  2.1*  AST  --   --   --  30  ALT  --   --   --  39  ALKPHOS  --   --   --  75  BILITOT  --   --   --  0.4  GFRNONAA  --  51* 49* 41*  ANIONGAP  --  8 15 6     Lipids  No results for input(s): "CHOL", "TRIG", "HDL", "LABVLDL", "LDLCALC", "CHOLHDL" in the last 168 hours.   Hematology Recent Labs  Lab 01/02/23 0643 01/03/23 0450 01/04/23 1011  WBC 11.8* 11.4* 9.9  RBC 2.91* 3.11* 2.59*  HGB 8.3* 8.7* 7.5*  HCT 27.1* 29.1* 24.1*  MCV 93.1 93.6 93.1  MCH 28.5 28.0 29.0  MCHC 30.6 29.9* 31.1  RDW 16.1* 16.0* 16.1*  PLT 167 207 230   Thyroid No results for input(s): "TSH", "FREET4" in the last 168 hours.  BNP Recent Labs  Lab 01/04/23 1011  BNP 1,223.7*    DDimer   No results for input(s): "DDIMER" in the last 168 hours.    Radiology    DG Abd Portable 1V Result Date: 01/01/2023 CLINICAL DATA:  Feeding tube placement EXAM: PORTABLE ABDOMEN - 1 VIEW COMPARISON:  04/30/2018 FINDINGS: AICD leads noted. A feeding tube is present with tip in the stomach antrum. Indistinct left hemidiaphragm, cannot exclude mild left basilar atelectasis. IMPRESSION: 1. Feeding tube tip in the stomach antrum. Electronically Signed   By: Gaylyn Rong M.D.   On: 01/01/2023 16:31   DG CHEST PORT 1 VIEW Result Date: 01/01/2023 CLINICAL DATA:  Possible density in right upper lobe seen in CT thoracic spine done on 12/31/2022 EXAM: PORTABLE CHEST 1 VIEW COMPARISON:  Previous studies including this CT thoracic spine done on 12/31/2022 FINDINGS: Transverse diameter of heart is increased. Thoracic aorta is tortuous and ectatic. Pacemaker/defibrillator battery is seen in the left infraclavicular region. Small faint patchy density is seen in right parahilar region. There is interval appearance of moderate sized area of increased markings in left lower lung field. Left lateral CP angle is indistinct. There is no pneumothorax. IMPRESSION: Small faint patchy density in right parahilar region may suggest subsegmental atelectasis. There is moderate sized new infiltrate in left lower lung field suggesting atelectasis/pneumonia. Small left pleural effusion. Electronically Signed   By: Ernie Avena M.D.   On: 01/01/2023 13:51    Cardiac Studies    12/29/22: TTE 1. Global hypokinesis worse in the septum. Left ventricular ejection  fraction, by estimation, is 30 to 35%. The left ventricle has moderately  decreased function. The left ventricle demonstrates global hypokinesis.  There is mild left ventricular  hypertrophy of the septal segment. Left ventricular diastolic parameters  are indeterminate.   2. Right ventricular systolic function is normal. The right ventricular  size is  normal.   3. The mitral valve is normal in structure. Mild mitral valve  regurgitation. No evidence of mitral stenosis.   4. The aortic valve is normal in structure. Aortic valve regurgitation is  not visualized. No aortic stenosis is present.   5. The inferior vena cava is normal in size with greater than 50%  respiratory variability, suggesting right atrial pressure of 3 mmHg.  CARE EVERYWHERE note  Sept 2022. Mr. Sear has had some episodes of AFib but also recently of Vtach. We were planning on getting a cardiac cath on him but he developed COVID. In the interim, his metoprolol was stopped and he had a few more episodes of VT. He is feeling better after the re-introduction of metoprolol. We reassessed his coronaries with minimal contrast ~ 20 ml and he had no new critical disease.   Patient Profile     82 y.o. male w/PMHx of O2 dep COPD, HTN, HLD, CAD (PCI > RCA 2019), ICM, ICD, VT, AFib   Mr. Isidore noted to have had a few hospitalizations recently was in the hospital after a fall in May 2024  diagnosed with right fracture femoral patient treated underwent right Hemi arthroscopy transferred to rehab had some complications with recurrent atrial fibrillation patient received both inappropriate iCD therapy for rapid AFib as well as appropriate for VF by notes as well, started on amiodarone and transitioned to p.o. discharge    Readmitted to 11/10/22 with acute/chronic CHF, pneumonia and rapid AFib, managed by his [place] team Noting that he required midodrine for his BP, limiting GDMT as well as his CKD. Amiodardone held 2/2 transaminitis > rate control strategy   Readmitted 12/17/22 with SOB > home O2 increased >> BIPAP felt to be COPD/CHF improved with IV duiretics > held with risoing Creat exacerbation Did have some RVR managed with diltiazem via IM. Cardiology brought on board rates mostly controlled reportedly with brief faster rates advised to continue his BB and f/u w/his [facility]  team. Discharged 12/20/22   Readmitted 12/27/22 THIS admission SOB/CP, tachycardic 140's started on dilt gtt, acute/chronic CHF exacerbation (felt less COPD this admission), febrile to 103 Cardiology felt HR driven by sepsis, managed with amiodarone again and consideration for PRN dig MRSA bacteremia found Neurology consulted for code stroke with transient R sided weakness and confusion, neck pain. 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 with fluctuating symptoms overnight despite anticoagulation, suspect that this is an unstable thrombus. And in thier discussion with neuro IR and vascular surgery, the decision was made to proceed with endovascular mechanical thrombectomy transported to [facility] for this procedure.  Not a TNK candidate There was mention of perhaps meningitis with his severe neck pain, not safe to LP given his anticoagulation, and need to continue anticoagulation.  Neuro favored increasing the blood level goal for his vancomycin to cover him in case he has seeded his meninges as well.  12/29/22 underwent complete revascularization of the  proximal left internal carotid artery with 1 pass with a 6 mm x 47 embotrap  retrieval device with contact aspiration, and proximal flow arrest revealing underlying severe stenosis at the distal aspect of the bulb. Status post stent assisted angioplasty of proximal Lt ICA stenosis with proximal and distal protection. > cangrelor gtt > ASA, brilinta  EP consulted 12/30/22 for device in setting of MRSA bacteremia, at the time AAO x1, on pressor support, anemic/thrombocytopenic and s/p acute ICD stent unable to interrup DAPT >> not an ICD system extraction candidate  Now on Eliquis and Brilinta  12/31/22: BC remain MRSA positive 12/31/22 started on low dose BB   Pt has stopped accepting oral intake, >> [place] 01/01/23  01/01/23: palliative notes: ramins  full code, d/w son (who's wife is an NP)  01/02/23 gen cards called for  Afib management recs, his BB titrated noting allergy to amiodarone/mexiletine, preference to avoid dig.  Also commented once/or if his mental status improves would need to be cleared to come off antiplatelet and anticoagulant in order to pursue TEE  01/02/23  had sustained VT treated with 2 rounds of ATP > HV therapy Increased BB dose given after his VT Lopressor 75mg  BID  7/22 > transition to PO amiodarone Moidrine being weaned , lopressor at 75mg  BID    Assessment & Plan    ICD/MRSA bacteremia VT Secondary prevention indication Device check this AM EVRA dual chamber ICD Can implanted 09/05/2017 RA lead is 5076 with an implant date as 09/11/2017 RV lead is a 6947 implanted 05/16/2008  Amiodarone and mexiletine are mentioned as allergy in note/history, unclear what the reaction is. Care everywhere notes: Nitrofuran analogues and Amiodarone analogues  reaction listed as  "Transaminitis" for amiodarone "Transaminitis ** confounded with amiodarone/mexiletine": for nitrofuran  LFTs yesterday were wnl Transitioned to oral amiodarone 400mg  BID 01/06/23 >> continue for 1 week then 200mg  daily   He clinically remains poor candidate for ICD system extraction He is clearly using his device Again seems to be perking up some, reports he is going to work on eating some breakfast this AM More interactive this morning again with Dr. Ladona Ridgel No family at bedside this AM  Will follow along, if his clinical trajectory continues to improve we can perhaps consider device extraction Will need to find out from IR/neurology though if he can have his eliquis and brlinita interrupted  Paroxysmal Afib CHA2DS2Vasc is 5, on Eliquis Controlled rates   Further as per attending service    For questions or updates, please contact [facility] Please consult [email or web address] for contact info under     Signed, Sheilah Pigeon, PA-C  01/07/2023, 8:31 AM   EP Attending   Patient seen and  examined. Agree with the findings as noted above. The patient is back in atrial fib with a VR of around 100. He will continue amiodarone. He does not look quite as good as yesterday. Continue supportive care. Currently not a candidate for lead extraction.      Sharlot Gowda Barbette Mcglaun,MD

## 2023-01-07 NOTE — Plan of Care (Signed)
Problem: Fluid Volume: Goal: Hemodynamic stability will improve Outcome: Progressing   Problem: Clinical Measurements: Goal: Diagnostic test results will improve Outcome: Progressing Goal: Signs and symptoms of infection will decrease Outcome: Progressing   Problem: Respiratory: Goal: Ability to maintain adequate ventilation will improve Outcome: Progressing   Problem: Education: Goal: Knowledge of General Education information will improve Description: Including pain rating scale, medication(s)/side effects and non-pharmacologic comfort measures Outcome: Progressing   Problem: Health Behavior/Discharge Planning: Goal: Ability to manage health-related needs will improve Outcome: Progressing   Problem: Clinical Measurements: Goal: Ability to maintain clinical measurements within normal limits will improve Outcome: Progressing Goal: Will remain free from infection Outcome: Progressing Goal: Diagnostic test results will improve Outcome: Progressing Goal: Respiratory complications will improve Outcome: Progressing Goal: Cardiovascular complication will be avoided Outcome: Progressing   Problem: Activity: Goal: Risk for activity intolerance will decrease Outcome: Progressing   Problem: Nutrition: Goal: Adequate nutrition will be maintained Outcome: Progressing   Problem: Coping: Goal: Level of anxiety will decrease Outcome: Progressing   Problem: Elimination: Goal: Will not experience complications related to bowel motility Outcome: Progressing Goal: Will not experience complications related to urinary retention Outcome: Progressing   Problem: Pain Managment: Goal: General experience of comfort will improve Outcome: Progressing   Problem: Safety: Goal: Ability to remain free from injury will improve Outcome: Progressing   Problem: Skin Integrity: Goal: Risk for impaired skin integrity will decrease Outcome: Progressing   Problem: Education: Goal:  Understanding of CV disease, CV risk reduction, and recovery process will improve Outcome: Progressing Goal: Individualized Educational Video(s) Outcome: Progressing   Problem: Activity: Goal: Ability to return to baseline activity level will improve Outcome: Progressing   Problem: Cardiovascular: Goal: Ability to achieve and maintain adequate cardiovascular perfusion will improve Outcome: Progressing Goal: Vascular access site(s) Level 0-1 will be maintained Outcome: Progressing   Problem: Health Behavior/Discharge Planning: Goal: Ability to safely manage health-related needs after discharge will improve Outcome: Progressing   Problem: Education: Goal: Knowledge of disease or condition will improve Outcome: Progressing Goal: Knowledge of secondary prevention will improve (MUST DOCUMENT ALL) Outcome: Progressing Goal: Knowledge of patient specific risk factors will improve Loraine Leriche N/A or DELETE if not current risk factor) Outcome: Progressing   Problem: Ischemic Stroke/TIA Tissue Perfusion: Goal: Complications of ischemic stroke/TIA will be minimized Outcome: Progressing   Problem: Coping: Goal: Will verbalize positive feelings about self Outcome: Progressing Goal: Will identify appropriate support needs Outcome: Progressing   Problem: Health Behavior/Discharge Planning: Goal: Ability to manage health-related needs will improve Outcome: Progressing Goal: Goals will be collaboratively established with patient/family Outcome: Progressing   Problem: Self-Care: Goal: Ability to participate in self-care as condition permits will improve Outcome: Progressing Goal: Verbalization of feelings and concerns over difficulty with self-care will improve Outcome: Progressing Goal: Ability to communicate needs accurately will improve Outcome: Progressing   Problem: Nutrition: Goal: Risk of aspiration will decrease Outcome: Progressing Goal: Dietary intake will improve Outcome:  Progressing   Problem: Education: Goal: Knowledge of disease or condition will improve Outcome: Progressing Goal: Knowledge of secondary prevention will improve (MUST DOCUMENT ALL) Outcome: Progressing Goal: Knowledge of patient specific risk factors will improve Loraine Leriche N/A or DELETE if not current risk factor) Outcome: Progressing   Problem: Ischemic Stroke/TIA Tissue Perfusion: Goal: Complications of ischemic stroke/TIA will be minimized Outcome: Progressing   Problem: Coping: Goal: Will verbalize positive feelings about self Outcome: Progressing Goal: Will identify appropriate support needs Outcome: Progressing   Problem: Health Behavior/Discharge Planning: Goal: Ability to manage health-related needs  will improve Outcome: Progressing Goal: Goals will be collaboratively established with patient/family Outcome: Progressing   Problem: Self-Care: Goal: Ability to participate in self-care as condition permits will improve Outcome: Progressing Goal: Verbalization of feelings and concerns over difficulty with self-care will improve Outcome: Progressing Goal: Ability to communicate needs accurately will improve Outcome: Progressing   Problem: Nutrition: Goal: Risk of aspiration will decrease Outcome: Progressing Goal: Dietary intake will improve Outcome: Progressing

## 2023-01-08 DIAGNOSIS — I509 Heart failure, unspecified: Secondary | ICD-10-CM

## 2023-01-08 LAB — BASIC METABOLIC PANEL
Anion gap: 9 (ref 5–15)
BUN: 77 mg/dL — ABNORMAL HIGH (ref 8–23)
CO2: 24 mmol/L (ref 22–32)
Calcium: 8.6 mg/dL — ABNORMAL LOW (ref 8.9–10.3)
Chloride: 98 mmol/L (ref 98–111)
Creatinine, Ser: 2.11 mg/dL — ABNORMAL HIGH (ref 0.61–1.24)
GFR, Estimated: 31 mL/min — ABNORMAL LOW (ref >60)
Glucose, Bld: 121 mg/dL — ABNORMAL HIGH (ref 70–99)
Potassium: 5.5 mmol/L — ABNORMAL HIGH (ref 3.5–5.1)
Sodium: 131 mmol/L — ABNORMAL LOW (ref 135–145)

## 2023-01-08 LAB — TYPE AND SCREEN: Antibody Screen: NEGATIVE

## 2023-01-08 LAB — GLUCOSE, CAPILLARY
Glucose-Capillary: 108 mg/dL — ABNORMAL HIGH (ref 70–99)
Glucose-Capillary: 116 mg/dL — ABNORMAL HIGH (ref 70–99)
Glucose-Capillary: 99 mg/dL (ref 70–99)
Glucose-Capillary: 79 mg/dL (ref 70–99)
Glucose-Capillary: 114 mg/dL — ABNORMAL HIGH (ref 70–99)

## 2023-01-08 LAB — CBC
HCT: 21.3 % — ABNORMAL LOW (ref 39.0–52.0)
Hemoglobin: 6.9 g/dL — CL (ref 13.0–17.0)
MCH: 29.7 pg (ref 26.0–34.0)
MCHC: 32.4 g/dL (ref 30.0–36.0)
MCV: 91.8 fL (ref 80.0–100.0)
Platelets: 276 10*3/uL (ref 150–400)
RBC: 2.32 MIL/uL — ABNORMAL LOW (ref 4.22–5.81)
RDW: 16 % — ABNORMAL HIGH (ref 11.5–15.5)
WBC: 11.5 10*3/uL — ABNORMAL HIGH (ref 4.0–10.5)
nRBC: 0 % (ref 0.0–0.2)

## 2023-01-08 LAB — BPAM RBC
Blood Product Expiration Date: 202408162359
ISSUE DATE / TIME: 202407241656
Unit Type and Rh: 600
Unit Type and Rh: 600

## 2023-01-08 LAB — HEMOGLOBIN AND HEMATOCRIT, BLOOD
HCT: 21.7 % — ABNORMAL LOW (ref 39.0–52.0)
Hemoglobin: 6.7 g/dL — CL (ref 13.0–17.0)

## 2023-01-08 LAB — MAGNESIUM: Magnesium: 2.2 mg/dL (ref 1.7–2.4)

## 2023-01-08 LAB — PREPARE RBC (CROSSMATCH)

## 2023-01-08 MED ORDER — SODIUM CHLORIDE 0.9 % IV SOLN
8.0000 mg/kg | INTRAVENOUS | Status: DC
  Administered 2023-01-09 – 2023-01-11 (×2): 600 mg via INTRAVENOUS
  Filled 2023-01-08 (×3): qty 12

## 2023-01-08 MED ORDER — SODIUM CHLORIDE 0.9% IV SOLUTION: Freq: Once | INTRAVENOUS | Status: AC

## 2023-01-08 MED ORDER — GERHARDT'S BUTT CREAM
TOPICAL_CREAM | Freq: Two times a day (BID) | CUTANEOUS | Status: DC
  Filled 2023-01-08: qty 1

## 2023-01-08 NOTE — Progress Notes (Signed)
PROGRESS NOTE    Matthew Zamora  ZOX:[id number] DOB: 10/23/1940 DOA: 12/29/2022 PCP: Jerl Mina, MD   Brief Narrative:  Patient is an 82 year old male with past medical history significant for refractory orthostatic hypotension, CHF with reduced EF, chronic nausea and vomiting.  Patient initially presented to [facility] with acute shortness of breath having found to be in heart failure exacerbation exacerbated by A-fib with RVR.  At [facility] patient was hypotensive requiring fluids placed on amiodarone drip with profoundly elevated fever placed on broad-spectrum antibiotics with cultures ultimately presenting with MRSA bacteremia.  Respiratory viral panel was also positive at this time for rhinovirus.  Acutely 7/14 patient suffered right sided acute weakness and aphasia CTA showing acute left occlusion of left ICA patient evaluated by neurology recommending transfer to [facility] for evaluation by neurointerventional radiology with left carotid stent placement.  Infectious disease team consulted given MRSA positive bacteremia, initial plan was for 6 weeks IV vancomycin(now daptomycin) and transition to doxycycline for life/ongoing prophylaxis.  Discussed transitioning to linezolid as well if more appropriate.  Complicating this is implanted device, AICD, which is unable to be properly evaluated for concurrent infection given TEE is likely not going to be tolerated by the patient.  EP following along, appreciate insight and recommendations - patient appears to be poor candidate for device extraction as well.  7/24 -patient now noted to have melena with downtrending hemoglobin concerning for occult GI bleed.  Will discuss with team given patient's current medications include Eliquis and Brilinta given recent stent placement. Spoke with NeuroIR Dr Corliss Skains - if we have to hold medications would start with holding Brilinta alone. Will discuss with family the risks/benefits of holding  anti-plt/anticoagulation in this patient.   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 -NeuroIR planning to continue Eliquis, Brilinta.  Discussed with Dr. Antionette Poles may need to hold Brilinta in the setting of possible GI bleed, would continue Eliquis unless absolutely necessary to discontinue -Continue statin.  PT/OT-SNF   Acute symptomatic anemia, rule out acute GI bleed  -Downtrending hemoglobin over the past 48 hours now below 7 with worsening tachycardia and weakness -2 unit PRBC ordered to transfuse today -Continue Brilinta and Eliquis for now-see above  MRSA bacteremia, potential source from cardiac device: Septic shock, resolved: -Repeat cultures have been negative so far. -ID consulted -If TEE cannot be performed, completed 6 weeks of IV daptomycin, then doxycycline for life (i.e. depending on goals of care).  Zyvox is an option as well.  -CT scan does not show any evidence of cervical/thoracic discitis.   -Patient has an ICD in place with concerns of vegetation.  Currently no plans for extraction but likely will need TEE first to evaluate- anticoagulation will also need to be held for this to move forward which is complicated by above symptomatic anemia as well as recent stent and thrombectomy..   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  No evidence of discitis seen on CT.  CK normal.  Pain control, physical therapy.  Voltaren gel   Acute on chronic CKD stage IIIb NAGMA Hyponatremia, improving Baseline creatinine around 1.5, downtrending appropriately -essentially back to baseline.   Normocytic anemia, likely anemia of chronic disease Hemoglobin stable at 8.3   Thrombocytopenia Platelets had drifted down to 92 now slowly recovering    GERD -PPI   Chronic hypotension On midodrine 30 mg 3 times daily, Mestinon   Depression/anxiety -Continue home sertraline 25 mg daily    Acute metabolic encephalopathy secondary to sepsis, improved Improved   DVT prophylaxis: Eliquis Code Status: Full code Family Communication: None at bedside Chronically ill patient with MRSA bacteremia and multiple ongoing issues.  Will be in the hospital for least next 3-5 days   Consultants:  ID Neurointerventional radiology EP Palliative care  Procedures:  Right carotid thrombectomy and stent placement  Antimicrobials:  Anti-infectives (From admission, onward)    Start     Dose/Rate Route Frequency Ordered Stop   01/07/23 1400  DAPTOmycin (CUBICIN) 600 mg in sodium chloride 0.9 % IVPB        8 mg/kg  75.8 kg 124 mL/hr over 30 Minutes Intravenous Daily 01/07/23 0859     12/30/22 2100  vancomycin (VANCOREADY) IVPB 750 mg/150 mL  Status:  Discontinued        750 mg 150 mL/hr over 60 Minutes Intravenous Every 24 hours 12/30/22 1037 12/30/22 1427   12/30/22 2100  vancomycin (VANCOCIN) IVPB 1000 mg/200 mL premix  Status:  Discontinued        1,000 mg 200 mL/hr over 60 Minutes Intravenous Every 24 hours 12/30/22 1427 01/07/23 0859   12/29/22 2000  vancomycin (VANCOREADY) IVPB 1250 mg/250 mL        1,250 mg 166.7 mL/hr over 90 Minutes Intravenous  Once 12/29/22 1928 12/30/22 0154   12/29/22 1917   vancomycin variable dose per unstable renal function (pharmacist dosing)  Status:  Discontinued         Does not apply See admin instructions 12/29/22 1928 12/30/22 1037      Subjective: Patient seen and evaluated today with no new acute complaints or concerns. No acute concerns or events noted overnight.  Objective: Vitals:   01/08/23 0200 01/08/23 0343 01/08/23 0400 01/08/23 0600  BP:  126/75    Pulse: 82 99 99 100  Resp: 14 19 20 18   Temp:  97.8 F (36.6 C)    TempSrc:  Oral    SpO2: 100% 100% 100% 100%  Weight:    75.5 kg  Height:        Intake/Output Summary (Last 24 hours) at 01/08/2023 0725 Last data filed at 01/08/2023 0700 Gross per 24 hour  Intake 1787 ml  Output 1300 ml  Net 487 ml   Filed Weights   01/06/23 0329 01/07/23 0341 01/08/23 0600  Weight: 74.1 kg 75.8 kg 75.5 kg    Examination:  General: Out of bed to chair, appears somewhat labored and diaphoretic. HEENT: Core track noted Lungs: Coarse breath sounds bilaterally without overt wheezes or rales. Heart:  Regular rate and rhythm.  Without murmurs, rubs, or gallops. Abdomen:  Soft, nontender, nondistended.  Without guarding or rebound. Extremities: Without cyanosis, clubbing, edema, or obvious deformity. Skin:  Warm and dry, no erythema.  Data Reviewed: I have personally reviewed following labs and imaging studies  CBC: Recent Labs  Lab 01/02/23 0643 01/03/23 0450 01/04/23 1011 01/08/23 0253  WBC 11.8* 11.4* 9.9 11.5*  NEUTROABS  --   --  8.5*  --   HGB 8.3* 8.7* 7.5* 6.9*  HCT 27.1* 29.1* 24.1* 21.3*  MCV 93.1 93.6 93.1 91.8  PLT 167 207 230 276   Basic Metabolic Panel: Recent Labs  Lab 01/01/23 1734 01/01/23 1734 01/02/23 0643 01/02/23 1734 01/03/23 0450 01/04/23 1011 01/06/23 0817 01/07/23 0809 01/08/23 0253  NA  --    < > 135  --  138 137 131* 129* 131*  K  --    < > 4.1  --  4.4 4.2 4.8 5.3* 5.5*  CL  --    < > 110  --  110 103 102 98 98  CO2  --    < > 19*  --  20* 19* 23  21* 24  GLUCOSE  --    < > 117*  --  99 146* 152* 96 121*  BUN  --    < > 23  --  26* 31* 48* 67* 77*  CREATININE  --    < > 1.64*  --  1.39* 1.45* 1.67* 1.88* 2.11*  CALCIUM  --    < > 8.6*  --  9.1 8.7* 8.5* 8.8* 8.6*  MG 2.0  --  2.0 1.8 2.0 1.9  --   --  2.2  PHOS 2.6  --  2.3* 2.1* 2.4*  --   --   --   --    < > = values in this interval not displayed.   GFR: Estimated Creatinine Clearance: 27 mL/min (A) (by C-G formula based on SCr of 2.11 mg/dL (H)). Liver Function Tests: Recent Labs  Lab 01/06/23 0817  AST 30  ALT 39  ALKPHOS 75  BILITOT 0.4  PROT 5.3*  ALBUMIN 2.1*   Cardiac Enzymes: Recent Labs  Lab 01/07/23 0809  CKTOTAL 20*   CBG: Recent Labs  Lab 01/07/23 1100 01/07/23 1554 01/07/23 1929 01/07/23 2342 01/08/23 0341  GLUCAP 102* 91 124* 128* 108*    Recent Results (from the past 240 hour(s))  SARS Coronavirus 2 by RT PCR (hospital order, performed in [facility] hospital lab) *cepheid single result test* Anterior Nasal Swab     Status: None   Collection Time: 12/29/22  1:06 PM   Specimen: Anterior Nasal Swab  Result Value Ref Range Status   SARS Coronavirus 2 by RT PCR NEGATIVE NEGATIVE Final    Comment: Performed at [facility], [address], [place], [place] [address]  MRSA Next Gen by PCR, Nasal     Status: Abnormal   Collection Time: 12/29/22  6:50 PM   Specimen: Nasal Mucosa; Nasal Swab  Result Value Ref Range Status   MRSA by PCR Next Gen DETECTED (A) NOT DETECTED Final    Comment: RESULT CALLED TO, READ BACK BY AND VERIFIED WITH: S. SAVAGE  RN 12/29/22 @ 2126 BY AB (NOTE) The GeneXpert MRSA Assay (FDA approved for NASAL specimens only), is one component of a comprehensive MRSA colonization surveillance program. It is not intended to diagnose MRSA infection nor to guide or monitor treatment for MRSA infections. Test performance is not FDA approved in patients less than 61 years old. Performed at [facility], [address] [place], [place] [address]   Culture, blood (Routine X 2) w Reflex to ID Panel     Status: Abnormal   Collection Time: 12/31/22  7:37 AM  Specimen: BLOOD RIGHT HAND  Result Value Ref Range Status   Specimen Description BLOOD RIGHT HAND  Final   Special Requests   Final    BOTTLES DRAWN AEROBIC AND ANAEROBIC Blood Culture adequate volume   Culture  Setup Time   Final    GRAM POSITIVE COCCI IN CLUSTERS ANAEROBIC BOTTLE ONLY CRITICAL RESULT CALLED TO, READ BACK BY AND VERIFIED WITH: PHARMD J. LEDFORD 01/01/23 @ 0330 BY AB Performed at [facility], [address] [place], [place] [address]    Culture METHICILLIN RESISTANT STAPHYLOCOCCUS AUREUS (A)  Final   Report Status 01/03/2023 FINAL  Final   Organism ID, Bacteria METHICILLIN RESISTANT STAPHYLOCOCCUS AUREUS  Final      Susceptibility   Methicillin resistant staphylococcus aureus - MIC*    CIPROFLOXACIN >=8 RESISTANT Resistant     ERYTHROMYCIN >=8 RESISTANT Resistant     GENTAMICIN <=0.5 SENSITIVE Sensitive     OXACILLIN >=4 RESISTANT Resistant     TETRACYCLINE <=1 SENSITIVE Sensitive     VANCOMYCIN 1 SENSITIVE Sensitive     TRIMETH/SULFA >=320 RESISTANT Resistant     CLINDAMYCIN <=0.25 SENSITIVE Sensitive     RIFAMPIN <=0.5 SENSITIVE Sensitive     Inducible Clindamycin NEGATIVE Sensitive     LINEZOLID 2 SENSITIVE Sensitive     * METHICILLIN RESISTANT STAPHYLOCOCCUS AUREUS  Blood Culture ID Panel (Reflexed)     Status: Abnormal   Collection Time: 12/31/22  7:37 AM  Result Value Ref Range Status   Enterococcus faecalis NOT DETECTED NOT DETECTED Final   Enterococcus Faecium NOT DETECTED NOT DETECTED Final   Listeria monocytogenes NOT DETECTED NOT DETECTED Final   Staphylococcus species DETECTED (A) NOT DETECTED Final    Comment: CRITICAL RESULT CALLED TO, READ BACK BY AND VERIFIED WITH: PHARMD J. LEDFORD 01/01/23 @ 0330 BY AB    Staphylococcus aureus (BCID) DETECTED (A) NOT DETECTED Final    Comment: Methicillin  (oxacillin)-resistant Staphylococcus aureus (MRSA). MRSA is predictably resistant to beta-lactam antibiotics (except ceftaroline). Preferred therapy is vancomycin unless clinically contraindicated. Patient requires contact precautions if  hospitalized. CRITICAL RESULT CALLED TO, READ BACK BY AND VERIFIED WITH: PHARMD J. LEDFORD 01/01/23 @ 0330 BY AB    Staphylococcus epidermidis NOT DETECTED NOT DETECTED Final   Staphylococcus lugdunensis NOT DETECTED NOT DETECTED Final   Streptococcus species NOT DETECTED NOT DETECTED Final   Streptococcus agalactiae NOT DETECTED NOT DETECTED Final   Streptococcus pneumoniae NOT DETECTED NOT DETECTED Final   Streptococcus pyogenes NOT DETECTED NOT DETECTED Final   A.calcoaceticus-baumannii NOT DETECTED NOT DETECTED Final   Bacteroides fragilis NOT DETECTED NOT DETECTED Final   Enterobacterales NOT DETECTED NOT DETECTED Final   Enterobacter cloacae complex NOT DETECTED NOT DETECTED Final   Escherichia coli NOT DETECTED NOT DETECTED Final   Klebsiella aerogenes NOT DETECTED NOT DETECTED Final   Klebsiella oxytoca NOT DETECTED NOT DETECTED Final   Klebsiella pneumoniae NOT DETECTED NOT DETECTED Final   Proteus species NOT DETECTED NOT DETECTED Final   Salmonella species NOT DETECTED NOT DETECTED Final   Serratia marcescens NOT DETECTED NOT DETECTED Final   Haemophilus influenzae NOT DETECTED NOT DETECTED Final   Neisseria meningitidis NOT DETECTED NOT DETECTED Final   Pseudomonas aeruginosa NOT DETECTED NOT DETECTED Final   Stenotrophomonas maltophilia NOT DETECTED NOT DETECTED Final   Candida albicans NOT DETECTED NOT DETECTED Final   Candida auris NOT DETECTED NOT DETECTED Final   Candida glabrata NOT DETECTED NOT DETECTED Final   Candida krusei NOT DETECTED NOT DETECTED Final  Candida parapsilosis NOT DETECTED NOT DETECTED Final   Candida tropicalis NOT DETECTED NOT DETECTED Final   Cryptococcus neoformans/gattii NOT DETECTED NOT DETECTED Final    Meth resistant mecA/C and MREJ DETECTED (A) NOT DETECTED Final    Comment: CRITICAL RESULT CALLED TO, READ BACK BY AND VERIFIED WITH: PHARMD J. LEDFORD 01/01/23 @ 0330 BY AB Performed at [facility], [address] [place], [place] [address]   Culture, blood (Routine X 2) w Reflex to ID Panel     Status: None   Collection Time: 12/31/22  7:43 AM   Specimen: BLOOD RIGHT HAND  Result Value Ref Range Status   Specimen Description BLOOD RIGHT HAND  Final   Special Requests   Final    BOTTLES DRAWN AEROBIC ONLY Blood Culture adequate volume   Culture   Final    NO GROWTH 5 DAYS Performed at [facility], [address], [place], [place] [address]    Report Status 01/05/2023 FINAL  Final  Culture, blood (Routine X 2) w Reflex to ID Panel     Status: None   Collection Time: 01/02/23  3:51 PM   Specimen: BLOOD LEFT HAND  Result Value Ref Range Status   Specimen Description BLOOD LEFT HAND  Final   Special Requests   Final    BOTTLES DRAWN AEROBIC AND ANAEROBIC Blood Culture adequate volume   Culture   Final    NO GROWTH 5 DAYS Performed at [facility], [address] [place], [place] [address]    Report Status 01/07/2023 FINAL  Final  Culture, blood (Routine X 2) w Reflex to ID Panel     Status: None   Collection Time: 01/02/23  3:51 PM   Specimen: BLOOD RIGHT HAND  Result Value Ref Range Status   Specimen Description BLOOD RIGHT HAND  Final   Special Requests   Final    BOTTLES DRAWN AEROBIC ONLY Blood Culture results may not be optimal due to an inadequate volume of blood received in culture bottles   Culture   Final    NO GROWTH 5 DAYS Performed at [facility], [address] [place], [place] [address]    Report Status 01/07/2023 FINAL  Final    Radiology Studies: No results found.  Scheduled Meds:  acetaminophen  1,000 mg Oral TID   amiodarone  400 mg Oral BID   apixaban  5 mg Oral BID   atorvastatin  20 mg Oral QHS   budesonide (PULMICORT)  nebulizer solution  0.25 mg Nebulization BID   diclofenac Sodium  4 g Topical QID   feeding supplement  237 mL Oral BID BM   feeding supplement (OSMOLITE 1.5 CAL)  1,000 mL Per Tube Q24H   feeding supplement (PROSource TF20)  60 mL Per Tube Daily   gabapentin  100 mg Oral QHS   ipratropium-albuterol  3 mL Nebulization BID   lidocaine  2 patch Transdermal Q24H   megestrol  400 mg Oral BID   metoprolol tartrate  75 mg Oral BID   midodrine  5 mg Oral TID WC   multivitamin  1 tablet Oral QHS   pantoprazole  40 mg Oral BID   pyridostigmine  60 mg Oral Q8H   sertraline  25 mg Oral Daily   sucralfate  1 g Oral TID WC & HS   ticagrelor  90 mg Oral BID   Or   ticagrelor  90 mg Per Tube BID   Continuous  Infusions:  sodium chloride Stopped (01/06/23 0517)   DAPTOmycin (CUBICIN) 600 mg in sodium chloride 0.9 % IVPB 600 mg (01/07/23 1544)    LOS: 10 days   Time spent: 55 minutes  Azucena Fallen, DO [facility]  If 7PM-7AM, please contact night-coverage [email or web address] 01/08/2023, 7:25 AM

## 2023-01-08 NOTE — TOC Progression Note (Signed)
Transition of Care [facility]) - Progression Note    Patient Details  Name: Matthew Zamora MRN: [id number] Date of Birth: 12-Mar-1941  Transition of Care [facility]) CM/SW Contact  Leander Rams, LCSW Phone Number: 01/08/2023, 2:35 PM  Clinical Narrative:    CSW spoke with [name] to go over SNF bed offer list. Son states they would prefer [facility], however [facility] not offered in [place]. CSW called [facility] to have referral looked into.   1:30PM [facility] called back and states they are able to admit pt when medically stable.   Insurance Berkley Harvey will be started closer to dc date. TOC will continue to follow.   Expected Discharge Plan: Skilled Nursing Facility Barriers to Discharge: Continued Medical Work up, SNF Pending bed offer  Expected Discharge Plan and Services     Post Acute Care Choice: Skilled Nursing Facility Living arrangements for the past 2 months: Single Family Home                                       Social Determinants of Health (SDOH) Interventions SDOH Screenings   Food Insecurity: No Food Insecurity (12/17/2022)  Housing: Patient Unable To Answer (12/17/2022)  Transportation Needs: No Transportation Needs (12/17/2022)  Utilities: Not At Risk (12/17/2022)  Financial Resource Strain: Low Risk  (12/05/2021)   Received from [facility], [facility]  Tobacco Use: Medium Risk (01/07/2023)    Readmission Risk Interventions    12/28/2022    9:53 AM 12/18/2022    4:18 PM 04/22/2022    3:54 PM  Readmission Risk Prevention Plan  Transportation Screening Complete Complete Complete  Medication Review Oceanographer) Complete Complete Complete  PCP or Specialist appointment within 3-5 days of discharge Complete Complete Complete  HRI or Home Care Consult Complete Complete Complete  SW Recovery Care/Counseling Consult Complete Complete Complete  Palliative Care Screening Not Applicable Not Applicable Not Applicable  Skilled Nursing Facility Complete Not  Applicable Not Applicable  Oletta Lamas, MSW, LCSWA, LCASA Transitions of Care  Clinical Social Worker I

## 2023-01-08 NOTE — Progress Notes (Addendum)
Rounding Note    Patient Name: Matthew Zamora Date of Encounter: 01/08/2023  [facility] Cardiologist: None  Subjective   Continues to c/o diffuse body pain, though mostly neck/shoulders, not feeling as well today, though less SOB this morning  Inpatient Medications    Scheduled Meds:  acetaminophen  1,000 mg Oral TID   amiodarone  400 mg Oral BID   apixaban  5 mg Oral BID   atorvastatin  20 mg Oral QHS   budesonide (PULMICORT) nebulizer solution  0.25 mg Nebulization BID   diclofenac Sodium  4 g Topical QID   feeding supplement  237 mL Oral BID BM   feeding supplement (OSMOLITE 1.5 CAL)  1,000 mL Per Tube Q24H   feeding supplement (PROSource TF20)  60 mL Per Tube Daily   gabapentin  100 mg Oral QHS   ipratropium-albuterol  3 mL Nebulization BID   lidocaine  2 patch Transdermal Q24H   megestrol  400 mg Oral BID   metoprolol tartrate  75 mg Oral BID   midodrine  5 mg Oral TID WC   multivitamin  1 tablet Oral QHS   pantoprazole  40 mg Oral BID   pyridostigmine  60 mg Oral Q8H   sertraline  25 mg Oral Daily   sucralfate  1 g Oral TID WC & HS   ticagrelor  90 mg Oral BID   Or   ticagrelor  90 mg Per Tube BID   Continuous Infusions:  sodium chloride Stopped (01/06/23 0517)   [START ON 01/09/2023] DAPTOmycin (CUBICIN) 600 mg in sodium chloride 0.9 % IVPB     PRN Meds: sodium chloride, docusate sodium, hydrALAZINE, hydrocortisone, hydrocortisone cream, levalbuterol, loratadine, metoprolol tartrate, Muscle Rub, ondansetron (ZOFRAN) IV, mouth rinse, oxyCODONE, polyethylene glycol, polyvinyl alcohol, senna-docusate, sodium chloride   Vital Signs    Vitals:   01/08/23 0400 01/08/23 0600 01/08/23 0729 01/08/23 0855  BP:   (!) 141/92 (!) 141/92  Pulse: 99 100 (!) 105 (!) 108  Resp: 20 18 (!) 22   Temp:   98.9 F (37.2 C)   TempSrc:   Oral   SpO2: 100% 100% 98%   Weight:  75.5 kg    Height:        Intake/Output Summary (Last 24 hours) at 01/08/2023  0910 Last data filed at 01/08/2023 4696 Gross per 24 hour  Intake 1683.25 ml  Output 1250 ml  Net 433.25 ml      01/08/2023    6:00 AM 01/07/2023    3:41 AM 01/06/2023    3:29 AM  Last 3 Weights  Weight (lbs) 166 lb 7.2 oz 167 lb 1.7 oz 163 lb 5.8 oz  Weight (kg) 75.5 kg 75.8 kg 74.1 kg      Telemetry    Currently AFib 80's-90's, has had SR 70's-80's, PAFib, rare V pacing nocturnal and only when in AFib, no VT over the weekend- Personally Reviewed  ECG    No new EKGs - Personally Reviewed  Physical Exam   Largely unchanged exam GEN: NAD, ill appearing, pallor Neck: No JVD Cardiac: irreg-irreg, no murmurs, rubs, or gallops.  Respiratory: CTA b/l (ant auscultation only). GI: Soft, nontender, non-distended  MS: RUE is lesss wollen and erythematous this AM Neuro:  AAO x2, follows commands Psych: Normal affect   Labs    High Sensitivity Troponin:   Recent Labs  Lab 12/27/22 2138 12/29/22 2233 12/30/22 0020 01/04/23 1011 01/04/23 1246  TROPONINIHS 30* 38* 38* 13 14  Chemistry Recent Labs  Lab 01/03/23 0450 01/04/23 1011 01/06/23 0817 01/07/23 0809 01/08/23 0253  NA 138 137 131* 129* 131*  K 4.4 4.2 4.8 5.3* 5.5*  CL 110 103 102 98 98  CO2 20* 19* 23 21* 24  GLUCOSE 99 146* 152* 96 121*  BUN 26* 31* 48* 67* 77*  CREATININE 1.39* 1.45* 1.67* 1.88* 2.11*  CALCIUM 9.1 8.7* 8.5* 8.8* 8.6*  MG 2.0 1.9  --   --  2.2  PROT  --   --  5.3*  --   --   ALBUMIN  --   --  2.1*  --   --   AST  --   --  30  --   --   ALT  --   --  39  --   --   ALKPHOS  --   --  75  --   --   BILITOT  --   --  0.4  --   --   GFRNONAA 51* 49* 41* 36* 31*  ANIONGAP 8 15 6 10 9     Lipids  No results for input(s): "CHOL", "TRIG", "HDL", "LABVLDL", "LDLCALC", "CHOLHDL" in the last 168 hours.   Hematology Recent Labs  Lab 01/03/23 0450 01/04/23 1011 01/08/23 0253  WBC 11.4* 9.9 11.5*  RBC 3.11* 2.59* 2.32*  HGB 8.7* 7.5* 6.9*  HCT 29.1* 24.1* 21.3*  MCV 93.6 93.1 91.8  MCH  28.0 29.0 29.7  MCHC 29.9* 31.1 32.4  RDW 16.0* 16.1* 16.0*  PLT 207 230 276   Thyroid No results for input(s): "TSH", "FREET4" in the last 168 hours.  BNP Recent Labs  Lab 01/04/23 1011  BNP 1,223.7*    DDimer  No results for input(s): "DDIMER" in the last 168 hours.    Radiology    DG Abd Portable 1V Result Date: 01/01/2023 CLINICAL DATA:  Feeding tube placement EXAM: PORTABLE ABDOMEN - 1 VIEW COMPARISON:  04/30/2018 FINDINGS: AICD leads noted. A feeding tube is present with tip in the stomach antrum. Indistinct left hemidiaphragm, cannot exclude mild left basilar atelectasis. IMPRESSION: 1. Feeding tube tip in the stomach antrum. Electronically Signed   By: Gaylyn Rong M.D.   On: 01/01/2023 16:31   DG CHEST PORT 1 VIEW Result Date: 01/01/2023 CLINICAL DATA:  Possible density in right upper lobe seen in CT thoracic spine done on 12/31/2022 EXAM: PORTABLE CHEST 1 VIEW COMPARISON:  Previous studies including this CT thoracic spine done on 12/31/2022 FINDINGS: Transverse diameter of heart is increased. Thoracic aorta is tortuous and ectatic. Pacemaker/defibrillator battery is seen in the left infraclavicular region. Small faint patchy density is seen in right parahilar region. There is interval appearance of moderate sized area of increased markings in left lower lung field. Left lateral CP angle is indistinct. There is no pneumothorax. IMPRESSION: Small faint patchy density in right parahilar region may suggest subsegmental atelectasis. There is moderate sized new infiltrate in left lower lung field suggesting atelectasis/pneumonia. Small left pleural effusion. Electronically Signed   By: Ernie Avena M.D.   On: 01/01/2023 13:51    Cardiac Studies    12/29/22: TTE 1. Global hypokinesis worse in the septum. Left ventricular ejection  fraction, by estimation, is 30 to 35%. The left ventricle has moderately  decreased function. The left ventricle demonstrates global  hypokinesis.  There is mild left ventricular  hypertrophy of the septal segment. Left ventricular diastolic parameters  are indeterminate.   2. Right ventricular systolic function is normal. The right  ventricular  size is normal.   3. The mitral valve is normal in structure. Mild mitral valve  regurgitation. No evidence of mitral stenosis.   4. The aortic valve is normal in structure. Aortic valve regurgitation is  not visualized. No aortic stenosis is present.   5. The inferior vena cava is normal in size with greater than 50%  respiratory variability, suggesting right atrial pressure of 3 mmHg.  CARE EVERYWHERE note  Sept 2022. Mr. Joynt has had some episodes of AFib but also recently of Vtach. We were planning on getting a cardiac cath on him but he developed COVID. In the interim, his metoprolol was stopped and he had a few more episodes of VT. He is feeling better after the re-introduction of metoprolol. We reassessed his coronaries with minimal contrast ~ 20 ml and he had no new critical disease.   Patient Profile     82 y.o. male w/PMHx of O2 dep COPD, HTN, HLD, CAD (PCI > RCA 2019), ICM, ICD, VT, AFib   Mr. Wence noted to have had a few hospitalizations recently was in the hospital after a fall in May 2024  diagnosed with right fracture femoral patient treated underwent right Hemi arthroscopy transferred to rehab had some complications with recurrent atrial fibrillation patient received both inappropriate iCD therapy for rapid AFib as well as appropriate for VF by notes as well, started on amiodarone and transitioned to p.o. discharge    Readmitted to 11/10/22 with acute/chronic CHF, pneumonia and rapid AFib, managed by his [employer] team Noting that he required midodrine for his BP, limiting GDMT as well as his CKD. Amiodardone held 2/2 transaminitis > rate control strategy   Readmitted 12/17/22 with SOB > home O2 increased >> BIPAP felt to be COPD/CHF improved with IV  duiretics > held with risoing Creat exacerbation Did have some RVR managed with diltiazem via IM. Cardiology brought on board rates mostly controlled reportedly with brief faster rates advised to continue his BB and f/u w/his [facility] team. Discharged 12/20/22   Readmitted 12/27/22 THIS admission SOB/CP, tachycardic 140's started on dilt gtt, acute/chronic CHF exacerbation (felt less COPD this admission), febrile to 103 Cardiology felt HR driven by sepsis, managed with amiodarone again and consideration for PRN dig MRSA bacteremia found Neurology consulted for code stroke with transient R sided weakness and confusion, neck pain. 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 with fluctuating symptoms overnight despite anticoagulation, suspect that this is an unstable thrombus. And in thier discussion with neuro IR and vascular surgery, the decision was made to proceed with endovascular mechanical thrombectomy transported to [facility] for this procedure.  Not a TNK candidate There was mention of perhaps meningitis with his severe neck pain, not safe to LP given his anticoagulation, and need to continue anticoagulation.  Neuro favored increasing the blood level goal for his vancomycin to cover him in case he has seeded his meninges as well.  12/29/22 underwent complete revascularization of the  proximal left internal carotid artery with 1 pass with a 6 mm x 47 embotrap  retrieval device with contact aspiration, and proximal flow arrest revealing underlying severe stenosis at the distal aspect of the bulb. Status post stent assisted angioplasty of proximal Lt ICA stenosis with proximal and distal protection. > cangrelor gtt > ASA, brilinta  EP consulted 12/30/22 for device in setting of MRSA bacteremia, at the time AAO x1, on pressor support, anemic/thrombocytopenic and s/p acute ICD stent unable to interrup DAPT >>  not an ICD system extraction candidate  Now on Eliquis and  Brilinta  12/31/22: BC remain MRSA positive 12/31/22 started on low dose BB   Pt has stopped accepting oral intake, >> Cortrack 01/01/23  01/01/23: palliative notes: ramins full code, d/w son (who's wife is an NP)  01/02/23 gen cards called for Afib management recs, his BB titrated noting allergy to amiodarone/mexiletine, preference to avoid dig.  Also commented once/or if his mental status improves would need to be cleared to come off antiplatelet and anticoagulant in order to pursue TEE  01/02/23  had sustained VT treated with 2 rounds of ATP > HV therapy Increased BB dose given after his VT Lopressor 75mg  BID  7/22 > transition to PO amiodarone Moidrine being weaned , lopressor at 75mg  BID  7/24: H/H 6.9/21.3  Assessment & Plan    ICD/MRSA bacteremia VT Secondary prevention indication Device check this admission EVRA dual chamber ICD Can implanted 09/05/2017 RA lead is [id number] with an implant date as 09/11/2017 RV lead is a 6947 implanted 05/16/2008  Approx 5 months battery to ERI  Amiodarone and mexiletine are mentioned as allergy in note/history, unclear what the reaction is. Care everywhere notes: Nitrofuran analogues and Amiodarone analogues  reaction listed as  "Transaminitis" for amiodarone "Transaminitis ** confounded with amiodarone/mexiletine": for nitrofuran  LFTs 01/06/23 were wnl Transitioned to oral amiodarone 400mg  BID 01/06/23 >> continue for 1 week then 200mg  daily   He clinically remains poor candidate for ICD system extraction He is clearly needs his device Seemed to be perking up some, though less so this morning Anemic, pending repeat labs (deferred to attending) No family at bedside this AM  Eddison Searls follow along, if his clinical trajectory improves significantly we can perhaps consider device extraction, though would not ne before next week if at all Clytee Heinrich need to find out from IR/neurology though if he can have his eliquis and brlinita  interrupted  Paroxysmal Afib CHA2DS2Vasc is 5, on Eliquis Controlled rates   Further as per attending service    For questions or updates, please contact [facility] Please consult [email or web address] for contact info under     Signed, Sheilah Pigeon, PA-C  01/08/2023, 9:10 AM   EP Attending   I have seen and examined this patient with Francis Dowse.  Agree with above, note added to reflect my findings.  Patient feeling poorly today.  Complains of neck pain.  GEN: Well nourished, well developed, in no acute distress  HEENT: normal  Neck: no JVD, carotid bruits, or masses Cardiac: Irregular; no murmurs, rubs, or gallops,no edema  Respiratory:  clear to auscultation bilaterally, normal work of breathing GI: soft, nontender, nondistended, + BS MS: no deformity or atrophy  Skin: warm and dry, device site well healed Neuro:  Strength and sensation are intact Psych: euthymic mood, full affect   Ventricular tachycardia: Currently on amiodarone.  No further ventricular tachycardia. MRSA bacteremia: Has ICD in place.  Currently a poor candidate for device extraction.  Rogan Ecklund continue to follow. Atrial fibrillation: Has been paroxysmal in the past.  With acute illness and atrial fibrillation.  Continue Eliquis and amiodarone.  Louiza Moor M. Keara Pagliarulo MD 01/08/2023 12:05 PM

## 2023-01-08 NOTE — Progress Notes (Signed)
Physical Therapy Treatment Patient Details Name: Matthew Zamora MRN: [id number] DOB: 05-Jan-1941 Today's Date: 01/08/2023   History of Present Illness 82 yo male admitted to [facility] 7/12 with SOB with decompensated HF. 7/13 hypotension with sepsis. 7/14 Rt weakness with code stroke and transfer to [facility], s/p Lt ICA thrombectomy with residual stenosis ultimately requiring left ICA stent placement.  Extubated post procedure but very confused. PMhx: COPD, CHF, AFib, CKD, HTN    PT Comments  Patient progressing with up OOB to chair this session.  Initially refusing due to shoulder pain, but with encouragement participated well.  Needs increased time to recover after mobility to EOB then after up to chair prior to standing second time for hygiene.  Patient limited by anxiety and admits he needs someone to stay with him in the room when he is that SOB.  PT will continue to follow in the acute setting.  Will need inpatient rehab <3 hours/day prior to d/c home.      Assistance Recommended at Discharge Frequent or constant Supervision/Assistance  If plan is discharge home, recommend the following:  Can travel by private vehicle    Two people to help with walking and/or transfers;Two people to help with bathing/dressing/bathroom;Assist for transportation;Assistance with cooking/housework;Help with stairs or ramp for entrance;Direct supervision/assist for medications management   No  Equipment Recommendations  None recommended by PT    Recommendations for Other Services       Precautions / Restrictions Precautions Precautions: Fall;Other (comment) Precaution Comments: very anxious, shoulder pain     Mobility  Bed Mobility Overal bed mobility: Needs Assistance Bed Mobility: Supine to Sit     Supine to sit: HOB elevated, Mod assist, +2 for safety/equipment     General bed mobility comments: assist for guiding legs off bed and to lift trunk; +2 for lines    Transfers Overall transfer  level: Needs assistance Equipment used: Rolling walker (2 wheels) Transfers: Sit to/from Stand, Bed to chair/wheelchair/BSC Sit to Stand: Mod assist, +2 physical assistance Stand pivot transfers: Mod assist, +2 physical assistance         General transfer comment: limited tolerance wanting to sit prior to stepping to chair so max cues and guidance to ensure safety; stood second time for hygiene due to small dark BM (MD & RN aware)    Ambulation/Gait               General Gait Details: NT due to anxiety, dyspnea, pain   Stairs             Wheelchair Mobility     Tilt Bed    Modified Rankin (Stroke Patients Only) Modified Rankin (Stroke Patients Only) Pre-Morbid Rankin Score: Moderate disability Modified Rankin: Severe disability     Balance Overall balance assessment: Needs assistance   Sitting balance-Leahy Scale: Fair Sitting balance - Comments: S at EOB no UE support   Standing balance support: Bilateral upper extremity supported Standing balance-Leahy Scale: Poor Standing balance comment: UE support for balance on RW and min to mod A while second person performing hygiene due to BM                            Cognition Arousal/Alertness: Awake/alert Behavior During Therapy: Restless, Anxious Overall Cognitive Status: No family/caregiver present to determine baseline cognitive functioning Area of Impairment: Attention, Following commands, Problem solving, Awareness, Safety/judgement, Memory  Current Attention Level: Selective Memory: Decreased short-term memory Following Commands: Follows one step commands with increased time, Follows one step commands consistently Safety/Judgement: Decreased awareness of safety, Decreased awareness of deficits   Problem Solving: Slow processing, Requires verbal cues, Requires tactile cues General Comments: encouragement to participate, focused on pain, needs time to recover and  admits to anxiety with symptoms of dyspnea        Exercises General Exercises - Lower Extremity Ankle Circles/Pumps: Both, Supine, AROM, 5 reps Heel Slides: AAROM, Both, Supine, 5 reps    General Comments General comments (skin integrity, edema, etc.): SpO2 >90% on 2L O2, BP stable in recliner; HR low 100's throughout a-fib      Pertinent Vitals/Pain Pain Assessment Faces Pain Scale: Hurts little more Pain Location: bilateral shoulders Pain Descriptors / Indicators: Grimacing, Guarding, Moaning Pain Intervention(s): Monitored during session, Repositioned    Home Living                          Prior Function            PT Goals (current goals can now be found in the care plan section) Progress towards PT goals: Progressing toward goals    Frequency    Min 1X/week      PT Plan Current plan remains appropriate    Co-evaluation              AM-PAC PT "6 Clicks" Mobility   Outcome Measure  Help needed turning from your back to your side while in a flat bed without using bedrails?: A Lot Help needed moving from lying on your back to sitting on the side of a flat bed without using bedrails?: Total Help needed moving to and from a bed to a chair (including a wheelchair)?: Total Help needed standing up from a chair using your arms (e.g., wheelchair or bedside chair)?: Total Help needed to walk in hospital room?: Total Help needed climbing 3-5 steps with a railing? : Total 6 Click Score: 7    End of Session Equipment Utilized During Treatment: Gait belt;Oxygen Activity Tolerance: Patient limited by fatigue Patient left: in chair;with call bell/phone within reach;with chair alarm set   PT Visit Diagnosis: Muscle weakness (generalized) (M62.81);Difficulty in walking, not elsewhere classified (R26.2)     Time: 9629-5284 PT Time Calculation (min) (ACUTE ONLY): 30 min  Charges:    $Therapeutic Activity: 23-37 mins PT General Charges $$ ACUTE PT  VISIT: 1 Visit                     Sheran Lawless, PT Acute Rehabilitation Services Office:[phone] 01/08/2023    Matthew Zamora 01/08/2023, 12:26 PM

## 2023-01-08 NOTE — Progress Notes (Signed)
Verified  the needs for blood transfusion but pt said no blood transfusion. MD made aware.

## 2023-01-08 NOTE — Progress Notes (Signed)
Patient ID: Matthew Zamora, male   DOB: 03/19/1941, 82 y.o.   MRN: [id number]    Progress Note from the Palliative Medicine Team at [facility]   Patient Name: Matthew Zamora        Date: 01/08/2023 DOB: [birth year]  Age: 82 y.o. MRN#: [id number] Attending Physician: Azucena Fallen, MD Primary Care Physician: Jerl Mina, MD Admit Date: 12/29/2022    Extensive chart review has been completed prior to meeting with patient/family  including labs, vital signs, imaging, progress/consult notes, orders, medications and available advance directive documents.    Per intake H&P --> 82 year old man with myriad of comorobidities including refractory orthostatic hypotension, HFrEF, chronic N/V who presented w/ SOB at [facility] acteremic with MRSA as well has + for rhinovirus. (+( R sided weakness w/ L ICA thrombus transferred to [facility] for emergent thrombectomy.    Palliative care has been asked to get involved in the setting of multiple readmissions, high chronic disease burden, and declined health to further discuss goals of care.    This NP assessed patient at the bedside as a follow up palliative medicine needs and emotional support.  Attempted to offer education to patient regarding current medical situation and his multiple co-morbidities.  Encouraged him to contemplate treatment option decision, advanced directive decisions and anticipatory care needs.   Although patient is alert, he looks very weeak today and oriented I have concerns around his insight into the complexity of his current medical situation.  I spoke in detail with Dr. Gerrianne Scale regarding treatment options.  Cardiology is reconsidering extraction of ICD given his clinical improvement over the last 2 days.  I verbalized my concern for long-term prognosis given his multiple co-morbidities, severe and nutritional deficiencies, and overall failure to thrive.  Once decision is made regarding extraction of ICD with  concerns for vegetation.  I will reach out to family to coordinate a family meeting for further discussion regarding treatment option decisions,Advanced directive decisions and anticipatory care needs.  Education offered today regarding  the importance of continued conversation with family and their  medical providers regarding overall plan of care and treatment options,  ensuring decisions are within the context of the patients values and GOCs.  Left message voicemail for son, await call back.   Questions and concerns addressed   Discussed with Dr Natale Milch and bedside RN    Time:  50  minutes  Detailed review of medical records ( labs, imaging, vital signs), medically appropriate exam ( MS, skin, resp)   discussed with treatment team, counseling and education to patient, family, staff, documenting clinical information, medication management, coordination of care    Lorinda Creed NP  Palliative Medicine Team Team Phone # [phone] Pager [phone]

## 2023-01-08 NOTE — Progress Notes (Signed)
Pharmacy Antibiotic Note - Follow-Up  Matthew Zamora is a 82 y.o. male admitted on 12/29/2022 with bacteremia.  He was initially started on vancomycin and cefepime at [facility]. Pharmacy was previously consulted to manage vancomycin but after discussing with MD, patient has been switched to daptomycin (per ID recommendations) due to poor renal function.  Renal function declined, estimated CrCl < 30 mL/min.  Plan: Adjust daptomycin to 600mg  (8mg /kg) IV Q48H Obtain CK q72h - monitoring more often d/t statin DDI Follow-up repeat blood cultures, renal function, & GOC  Height: 5\' 8"  (172.7 cm) Weight: 75.5 kg (166 lb 7.2 oz) IBW/kg (Calculated) : 68.4  Temp (24hrs), Avg:98 F (36.7 C), Min:97.6 F (36.4 C), Max:98.9 F (37.2 C)  Recent Labs  Lab 01/02/23 0643 01/02/23 2012 01/03/23 0450 01/04/23 1011 01/06/23 0817 01/07/23 0809 01/08/23 0253  WBC 11.8*  --  11.4* 9.9  --   --  11.5*  CREATININE 1.64*  --  1.39* 1.45* 1.67* 1.88* 2.11*  VANCOTROUGH  --  18  --   --   --   --   --     Estimated Creatinine Clearance: 27 mL/min (A) (by C-G formula based on SCr of 2.11 mg/dL (H)).    Allergies  Allergen Reactions   Nitrofuran Derivatives Other (See Comments)    Transaminitis    Spironolactone Other (See Comments)    Significant transaminitis     Vanc 7/13 >> 7/22 Cefepime x1 7/13  7/13 Vanc 1500mg  at 1437 (SCr 1.76) 7/14 VR = 10 mcg/mL (30 hrs from loading dose) >> 1250mg  at 2100 (SCr 2.12) 7/18 Vanc trough = 18 mcg/ml (THERAPEUTIC)  7/13 BCx - MRSA  7/14 MRSA PCR - positive 7/16 BCx - 1/4 +MRSA 7/18 Bcx ngtdF  Nicole Kindred, PharmD PGY1 Pharmacy Resident 01/08/2023 8:21 AM

## 2023-01-09 DIAGNOSIS — I509 Heart failure, unspecified: Secondary | ICD-10-CM

## 2023-01-09 DIAGNOSIS — E43 Unspecified severe protein-calorie malnutrition: Secondary | ICD-10-CM

## 2023-01-09 DIAGNOSIS — R531 Weakness: Secondary | ICD-10-CM

## 2023-01-09 LAB — CBC
HCT: 27.4 % — ABNORMAL LOW (ref 39.0–52.0)
Hemoglobin: 8.9 g/dL — ABNORMAL LOW (ref 13.0–17.0)
MCH: 29.4 pg (ref 26.0–34.0)
MCHC: 32.5 g/dL (ref 30.0–36.0)
MCV: 90.4 fL (ref 80.0–100.0)
Platelets: 291 10*3/uL (ref 150–400)
RBC: 3.03 MIL/uL — ABNORMAL LOW (ref 4.22–5.81)
RDW: 15.8 % — ABNORMAL HIGH (ref 11.5–15.5)
WBC: 14.1 10*3/uL — ABNORMAL HIGH (ref 4.0–10.5)
nRBC: 0 % (ref 0.0–0.2)

## 2023-01-09 LAB — GLUCOSE, CAPILLARY
Glucose-Capillary: 104 mg/dL — ABNORMAL HIGH (ref 70–99)
Glucose-Capillary: 112 mg/dL — ABNORMAL HIGH (ref 70–99)
Glucose-Capillary: 115 mg/dL — ABNORMAL HIGH (ref 70–99)
Glucose-Capillary: 117 mg/dL — ABNORMAL HIGH (ref 70–99)
Glucose-Capillary: 122 mg/dL — ABNORMAL HIGH (ref 70–99)
Glucose-Capillary: 133 mg/dL — ABNORMAL HIGH (ref 70–99)
Glucose-Capillary: 193 mg/dL — ABNORMAL HIGH (ref 70–99)
Glucose-Capillary: 99 mg/dL (ref 70–99)

## 2023-01-09 LAB — BASIC METABOLIC PANEL
Anion gap: 9 (ref 5–15)
Anion gap: 9 (ref 5–15)
Anion gap: 9 (ref 5–15)
Anion gap: 13 (ref 5–15)
BUN: 86 mg/dL — ABNORMAL HIGH (ref 8–23)
BUN: 94 mg/dL — ABNORMAL HIGH (ref 8–23)
BUN: 93 mg/dL — ABNORMAL HIGH (ref 8–23)
BUN: 95 mg/dL — ABNORMAL HIGH (ref 8–23)
CO2: 19 mmol/L — ABNORMAL LOW (ref 22–32)
CO2: 24 mmol/L (ref 22–32)
CO2: 23 mmol/L (ref 22–32)
Calcium: 9.1 mg/dL (ref 8.9–10.3)
Calcium: 9.2 mg/dL (ref 8.9–10.3)
Calcium: 9.1 mg/dL (ref 8.9–10.3)
Chloride: 102 mmol/L (ref 98–111)
Chloride: 98 mmol/L (ref 98–111)
Chloride: 98 mmol/L (ref 98–111)
Creatinine, Ser: 2.22 mg/dL — ABNORMAL HIGH (ref 0.61–1.24)
Creatinine, Ser: 2.28 mg/dL — ABNORMAL HIGH (ref 0.61–1.24)
Creatinine, Ser: 2.24 mg/dL — ABNORMAL HIGH (ref 0.61–1.24)
Creatinine, Ser: 2.3 mg/dL — ABNORMAL HIGH (ref 0.61–1.24)
GFR, Estimated: 29 mL/min — ABNORMAL LOW (ref >60)
GFR, Estimated: 28 mL/min — ABNORMAL LOW (ref >60)
GFR, Estimated: 29 mL/min — ABNORMAL LOW (ref >60)
GFR, Estimated: 28 mL/min — ABNORMAL LOW (ref >60)
Glucose, Bld: 146 mg/dL — ABNORMAL HIGH (ref 70–99)
Glucose, Bld: 98 mg/dL (ref 70–99)
Glucose, Bld: 106 mg/dL — ABNORMAL HIGH (ref 70–99)
Glucose, Bld: 134 mg/dL — ABNORMAL HIGH (ref 70–99)
Potassium: 5.9 mmol/L — ABNORMAL HIGH (ref 3.5–5.1)
Potassium: 6.7 mmol/L (ref 3.5–5.1)
Potassium: 5.3 mmol/L — ABNORMAL HIGH (ref 3.5–5.1)
Potassium: 5.1 mmol/L (ref 3.5–5.1)
Sodium: 127 mmol/L — ABNORMAL LOW (ref 135–145)
Sodium: 130 mmol/L — ABNORMAL LOW (ref 135–145)
Sodium: 131 mmol/L — ABNORMAL LOW (ref 135–145)
Sodium: 134 mmol/L — ABNORMAL LOW (ref 135–145)

## 2023-01-09 LAB — TYPE AND SCREEN
ABO/RH(D): AB NEG
Unit division: 0
Unit division: 0

## 2023-01-09 LAB — BPAM RBC
Blood Product Expiration Date: 202408162359
ISSUE DATE / TIME: 202407241409

## 2023-01-09 LAB — CK: Total CK: 25 U/L — ABNORMAL LOW (ref 49–397)

## 2023-01-09 MED ORDER — INSULIN ASPART 100 UNIT/ML IV SOLN
10.0000 [IU] | Freq: Once | INTRAVENOUS | Status: AC
  Administered 2023-01-09: 10 [IU] via INTRAVENOUS

## 2023-01-09 MED ORDER — NALOXONE HCL 0.4 MG/ML IJ SOLN
0.4000 mg | INTRAMUSCULAR | Status: DC | PRN
  Administered 2023-01-09: 0.4 mg via INTRAVENOUS

## 2023-01-09 MED ORDER — NALOXONE HCL 0.4 MG/ML IJ SOLN
INTRAMUSCULAR | Status: AC
  Filled 2023-01-09: qty 1

## 2023-01-09 MED ORDER — ALBUTEROL SULFATE (2.5 MG/3ML) 0.083% IN NEBU
10.0000 mg | INHALATION_SOLUTION | Freq: Once | RESPIRATORY_TRACT | Status: AC
  Administered 2023-01-09: 10 mg via RESPIRATORY_TRACT
  Filled 2023-01-09: qty 12

## 2023-01-09 MED ORDER — SODIUM ZIRCONIUM CYCLOSILICATE 10 G PO PACK
10.0000 g | PACK | ORAL | Status: AC
  Administered 2023-01-09: 10 g via ORAL
  Filled 2023-01-09: qty 1

## 2023-01-09 MED ORDER — SODIUM BICARBONATE 8.4 % IV SOLN
50.0000 meq | Freq: Once | INTRAVENOUS | Status: AC
  Administered 2023-01-09: 50 meq via INTRAVENOUS
  Filled 2023-01-09: qty 50

## 2023-01-09 MED ORDER — SODIUM ZIRCONIUM CYCLOSILICATE 5 G PO PACK
5.0000 g | PACK | Freq: Once | ORAL | Status: AC
  Administered 2023-01-09: 5 g via ORAL
  Filled 2023-01-09: qty 1

## 2023-01-09 MED ORDER — DEXTROSE 50 % IV SOLN
1.0000 | Freq: Once | INTRAVENOUS | Status: AC
  Administered 2023-01-09: 50 mL via INTRAVENOUS
  Filled 2023-01-09: qty 50

## 2023-01-09 MED ORDER — FUROSEMIDE 10 MG/ML IJ SOLN: 60.0000 mg | Freq: Once | INTRAMUSCULAR | Status: DC

## 2023-01-09 MED ORDER — FUROSEMIDE 10 MG/ML IJ SOLN
60.0000 mg | INTRAMUSCULAR | Status: AC
  Administered 2023-01-09: 60 mg via INTRAVENOUS
  Filled 2023-01-09: qty 6

## 2023-01-09 MED ORDER — SODIUM CHLORIDE 0.9 % IV BOLUS
500.0000 mL | Freq: Once | INTRAVENOUS | Status: AC
  Administered 2023-01-09: 500 mL via INTRAVENOUS

## 2023-01-09 MED ORDER — SODIUM ZIRCONIUM CYCLOSILICATE 5 G PO PACK
5.0000 g | PACK | ORAL | Status: AC
  Administered 2023-01-09: 5 g via ORAL
  Filled 2023-01-09: qty 1

## 2023-01-09 MED ORDER — CALCIUM GLUCONATE 10 % IV SOLN
1.0000 g | Freq: Once | INTRAVENOUS | Status: AC
  Administered 2023-01-09: 1 g via INTRAVENOUS
  Filled 2023-01-09: qty 10

## 2023-01-09 NOTE — Progress Notes (Addendum)
Patient Name: Matthew Zamora Date of Encounter: 01/09/2023  Primary Cardiologist: None Electrophysiologist: None  Interval Summary   Eating breakfast. At this time, the patient denies chest pain, shortness of breath, or any new concerns.  Inpatient Medications    Scheduled Meds:  acetaminophen  1,000 mg Oral TID   amiodarone  400 mg Oral BID   apixaban  5 mg Oral BID   atorvastatin  20 mg Oral QHS   budesonide (PULMICORT) nebulizer solution  0.25 mg Nebulization BID   diclofenac Sodium  4 g Topical QID   feeding supplement  237 mL Oral BID BM   feeding supplement (OSMOLITE 1.5 CAL)  1,000 mL Per Tube Q24H   feeding supplement (PROSource TF20)  60 mL Per Tube Daily   gabapentin  100 mg Oral QHS   Gerhardt's butt cream   Topical BID   ipratropium-albuterol  3 mL Nebulization BID   lidocaine  2 patch Transdermal Q24H   megestrol  400 mg Oral BID   metoprolol tartrate  75 mg Oral BID   midodrine  5 mg Oral TID WC   multivitamin  1 tablet Oral QHS   pantoprazole  40 mg Oral BID   pyridostigmine  60 mg Oral Q8H   sertraline  25 mg Oral Daily   sucralfate  1 g Oral TID WC & HS   ticagrelor  90 mg Oral BID   Or   ticagrelor  90 mg Per Tube BID   Continuous Infusions:  sodium chloride Stopped (01/06/23 0517)   DAPTOmycin (CUBICIN) 600 mg in sodium chloride 0.9 % IVPB     PRN Meds: sodium chloride, docusate sodium, hydrALAZINE, hydrocortisone, hydrocortisone cream, levalbuterol, loratadine, metoprolol tartrate, Muscle Rub, ondansetron (ZOFRAN) IV, mouth rinse, oxyCODONE, polyethylene glycol, polyvinyl alcohol, senna-docusate, sodium chloride   Vital Signs    Vitals:   01/09/23 0350 01/09/23 0732 01/09/23 0740 01/09/23 0827  BP:      Pulse: 100   99  Resp:      Temp: 97.7 F (36.5 C)   98 F (36.7 C)  TempSrc: Axillary   Axillary  SpO2:  98% 96% 100%  Weight: 77.2 kg     Height:        Intake/Output Summary (Last 24 hours) at 01/09/2023 0917 Last data filed at  01/09/2023 1610 Gross per 24 hour  Intake 2257 ml  Output 1800 ml  Net 457 ml   Filed Weights   01/07/23 0341 01/08/23 0600 01/09/23 0350  Weight: 75.8 kg 75.5 kg 77.2 kg    Physical Exam    GEN- chronically ill appearing adult male lying in bed in NAD Neuro-awake, weak voice, speech clear, generalized weakness, participates in conversation  Lungs- Clear to ausculation bilaterally, normal work of breathing Cardiac- Irregularly irregular rate and rhythm, no murmurs, rubs or gallops GI- soft, NT, ND, + BS, dobhoff in place Extremities- thin skin, scattered ecchymosis, generalized trace edema, muscle wasting   Telemetry    AF 90-110's, occasional PVC's (personally reviewed)  Studies:  ECHO 12/29/22 > global hypokinesis worse in the septum, LVEF 30-35%, LV moderately decreased function, LV global hypokinesis, mild LV hypertrophy of the septal segment, RV systolic function normal, mild MVR. No mention of vegetation on TTE.   Device:  EVRA dual chamber ICD Can implanted 09/05/2017 RA lead is [id number] with an implant date as 09/11/2017 RV lead is a [id number] implanted 05/16/2008 Approximately 5 months to [facility] Course    WALFRED BETTENDORF is a  82 y.o. male with PMH of O2 dep COPD, HTN, HLD, CAD (PCI > RCA 2019), ICM, ICD, VT, AFib admitted 12/27/22 for SOB, chest pain, tachycardia, acute on chronic CHF exacerbation and fever to 103.  Sepsis work up found MRSA bacteremia.  Hospital course complicated by right sided weakness, confusion and neck pain.  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 EP consulted 7/15 for evaluation of CIED infection. TTE did not show specific vegetation. TEE not able to be performed due to patient instability / poor candidate. He has been unable to eat and required Cortrak for nutrition. Hospital course further complicated by bleeding with anemia. 7/18 had sustained VT treated with 2 rounds of ATP and  subsequent HV therapy.   Assessment & Plan    CIED Infection / MRSA Bacteremia  VT  Secondary Prevention ICD  Most recent blood cultures from 7/18 negative.  -clinically remains a poor candidate for ICD system extraction. No immediate plan for extraction, if ever.  -overall prognosis remains very guarded, continue palliative discussions  -amiodarone 400mg  BID started 7/22, continue for 1 week then transition to 200mg  every day on 7/29 -continue lopressor  -he has 5 months until ERI, Mykell Rawl ultimately have to make decision regarding generator change with primary EP team   Paroxysmal AF  CHA2DS2Vasc 5 -difficult situation with eliquis, concern for bleeding, recent ICA thrombus and CVA -rate controlled  -on eliquis 5mg  -defer decision on OAC to Neurology / Primary   Anemia  -per primary   AKI  Worsening pattern, ? In setting of blood loss  -per primary   [facility] Isaac Lacson be available as needed. Please call back if patient's status changes / new needs arise.   For questions or updates, please contact [facility] Please consult [email or web address] for contact info under Cardiology/STEMI.  Signed, Canary Brim, MSN, APRN, NP-C, AGACNP-BC [facility] - Electrophysiology  01/09/2023, 10:42 AM  I have seen and examined this patient with Canary Brim.  Agree with above, note added to reflect my findings.  Patient eating breakfast.  Denies chest pain or shortness of breath.  GEN: Well nourished, well developed, in no acute distress  HEENT: normal  Neck: no JVD, carotid bruits, or masses Cardiac: Irregular ; no murmurs, rubs, or gallops,no edema  Respiratory:  clear to auscultation bilaterally, normal work of breathing GI: soft, nontender, nondistended, + BS MS: no deformity or atrophy  Skin: warm and dry, device site well healed Neuro:  Strength and sensation are intact Psych: euthymic mood, full affect   MRSA bacteremia/ICD infection/VT: Continue amiodarone for  secondary prevention.  Patient, should he improve, Lamiracle Chaidez need ICD extraction.  He is a poor candidate at this point.  Would continue antibiotics. Paroxysmal atrial fibrillation: Has concerns for bleeding.  On Eliquis.  Has a recent ICA thrombus and CVA.  Plan per primary team and neurology. Anemia: Plan per primary team  Marika Mahaffy M. Nyesha Cliff MD 01/09/2023 5:26 PM

## 2023-01-09 NOTE — Significant Event (Signed)
Rapid Response Event Note   Reason for Call :  Decreased LOC, pinpoint pupils    Initial Focused Assessment:  Patient is difficult to awake.  He has pinpoint pupils and slow RR.   Lung sounds decreased bases. Snoring respirations  BP 97/61  HR 75  RR 8-14  O2 sat 100%   Interventions:  0.4 Narcan  Reassessment:  He is awake and asking to brush his teeth.  Plan of Care:  RN to call if he becomes somnolent again.   Event Summary:   MD Notified: Natale Milch Call Time:  1825 Arrival Time: 1830 End Time: 1850  Marcellina Millin, RN

## 2023-01-09 NOTE — Progress Notes (Signed)
Dr. Natale Milch made aware of patient's low RR and continued decreased alertness. Will d/c Oxy and continue to monitor.

## 2023-01-09 NOTE — Progress Notes (Addendum)
PROGRESS NOTE    Matthew Zamora  QIO:[id number] DOB: Sep 26, 1940 DOA: 12/29/2022 PCP: Jerl Mina, MD   Brief Narrative:  Patient is an 82 year old male with past medical history significant for refractory orthostatic hypotension, CHF with reduced EF, chronic nausea and vomiting.  Patient initially presented to [facility] with acute shortness of breath having found to be in heart failure exacerbation exacerbated by A-fib with RVR.  At [facility] patient was hypotensive requiring fluids placed on amiodarone drip with profoundly elevated fever placed on broad-spectrum antibiotics with cultures ultimately presenting with MRSA bacteremia.  Respiratory viral panel was also positive at this time for rhinovirus.  Acutely 7/14 patient suffered right sided acute weakness and aphasia CTA showing acute left occlusion of left ICA patient evaluated by neurology recommending transfer to [facility] for evaluation by neurointerventional radiology with left carotid stent placement.  Infectious disease team consulted given MRSA positive bacteremia, initial plan was for 6 weeks IV vancomycin(now daptomycin) and transition to doxycycline for life/ongoing prophylaxis.  Discussed transitioning to linezolid as well if more appropriate.  Complicating this is implanted device, AICD, which is unable to be properly evaluated for concurrent infection given TEE is likely not going to be tolerated by the patient.  EP following along, appreciate insight and recommendations - patient appears to be poor candidate for device extraction as well.  7/24 -patient now noted to have melena with downtrending hemoglobin concerning for occult GI bleed.  Will discuss with team given patient's current medications include Eliquis and Brilinta given recent stent placement. Spoke with NeuroIR Dr Corliss Skains - if we have to hold medications would start with holding Brilinta alone. Will discuss with family the risks/benefits of holding  anti-plt/anticoagulation in this patient.  **Repeat BMP after lokelma dose afternoon show markedly elevated potassium despite previous treatment with questionable changes on tele. Will get stat EKG and start emergent hyperkalemia protocol with calcium, albuterol, Lokelma, Lasix, insulin/D50 with repeat potassium check in 1 to 2 hours.  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 -NeuroIR planning to continue Eliquis, Brilinta.  Discussed with Dr. Antionette Poles may need to hold Brilinta in the setting of possible GI bleed, would continue Eliquis unless absolutely necessary to discontinue -Continue statin.  PT/OT-SNF   Acute symptomatic anemia, rule out acute GI bleed  -Downtrending hemoglobin over the past 48 hours now below 7 with worsening tachycardia and weakness -2 unit PRBC ordered to transfuse today -Continue Brilinta and Eliquis for now-see above  MRSA bacteremia, potential source from cardiac device: Septic shock, resolved: -Repeat cultures have been negative so far. -ID consulted -If TEE cannot be performed, completed 6 weeks of IV daptomycin, then doxycycline for life (i.e. depending on goals of care).  Zyvox is an option as well.  -CT scan does not show any evidence of cervical/thoracic discitis.   -Patient has an ICD in place with concerns of vegetation.  Currently no plans for extraction but likely will need TEE first to evaluate which would be a moderate to high risk procedure - anticoagulation will also need to be held for this to move forward which is complicated by above symptomatic anemia as well as recent stent and thrombectomy and chronic A-fib.   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  No evidence of discitis seen on CT.  CK normal.  Pain control, physical therapy.  Voltaren gel   Acute on chronic CKD stage IIIb NAGMA Hyponatremia, improving Baseline creatinine around 1.5, essentially back to baseline.   Normocytic anemia, likely anemia of chronic disease Hemoglobin stable at 8.3   Thrombocytopenia Platelets had drifted down to 92 now slowly recovering    GERD -PPI   Chronic hypotension On midodrine 30 mg 3 times daily, Mestinon   Depression/anxiety -Continue home sertraline 25 mg daily    Acute metabolic encephalopathy secondary to sepsis, improved Improved  DVT prophylaxis: Eliquis Code Status: Full code Family Communication: Daughter-in-law Matthew Zamora updated at length over the phone, she is a nurse so we were able to have very thorough conversation on Matthew Zamora's medical care.  Plan for family visit tomorrow to discuss goals of care and plans moving forward   Consultants:  ID Neurointerventional radiology EP Palliative care  Procedures:  Right carotid thrombectomy and stent placement  Antimicrobials:  Anti-infectives (From admission, onward)    Start     Dose/Rate Route Frequency Ordered Stop   01/09/23 1400  DAPTOmycin (CUBICIN) 600 mg in sodium chloride 0.9 % IVPB        8 mg/kg  75.8 kg 124 mL/hr over 30 Minutes Intravenous Every 48 hours 01/08/23 0814     01/07/23 1400   DAPTOmycin (CUBICIN) 600 mg in sodium chloride 0.9 % IVPB  Status:  Discontinued        8 mg/kg  75.8 kg 124 mL/hr over 30 Minutes Intravenous Daily 01/07/23 0859 01/08/23 0814   12/30/22 2100  vancomycin (VANCOREADY) IVPB 750 mg/150 mL  Status:  Discontinued        750 mg 150 mL/hr over 60 Minutes Intravenous Every 24 hours 12/30/22 1037 12/30/22 1427   12/30/22 2100  vancomycin (VANCOCIN) IVPB 1000 mg/200 mL premix  Status:  Discontinued        1,000 mg 200 mL/hr over 60 Minutes Intravenous Every 24 hours 12/30/22 1427 01/07/23 0859   12/29/22 2000  vancomycin (VANCOREADY) IVPB 1250 mg/250 mL        1,250 mg 166.7 mL/hr over 90 Minutes Intravenous  Once 12/29/22 1928 12/30/22 0154   12/29/22 1917  vancomycin variable dose per unstable renal function (pharmacist dosing)  Status:  Discontinued         Does not apply See admin instructions 12/29/22 1928 12/30/22 1037      Subjective: No acute issues or events overnight, no further episodes of dark tarry stool denies blood per rectum otherwise denies chest pain shortness of breath vomiting headache fevers or chills.  He does report transient episode of nausea which makes eating difficult.  Objective: Vitals:   01/08/23 1916 01/08/23 1943 01/09/23 0004 01/09/23 0350  BP:      Pulse:   94 100  Resp:      Temp: 97.7 F (36.5 C) 97.7 F (36.5 C) 98.1 F (36.7 C) 97.7 F (36.5 C)  TempSrc: Axillary Axillary Oral Axillary  SpO2:      Weight:    77.2 kg  Height:        Intake/Output Summary (Last 24 hours) at 01/09/2023 0716 Last data filed at 01/09/2023 0706 Gross per 24 hour  Intake 2377 ml  Output 1650 ml  Net 727 ml   Filed Weights   01/07/23 0341 01/08/23 0600 01/09/23 0350  Weight: 75.8 kg 75.5 kg 77.2 kg    Examination:  General: Sitting up in bed attempting to navigate breakfast. HEENT: Core track noted Lungs: Coarse breath sounds bilaterally without overt wheezes or rales. Heart: Irregularly irregular.  Without  murmurs, rubs, or gallops. Abdomen:  Soft, nontender, nondistended.  Without guarding or rebound. Extremities: Without cyanosis, clubbing, edema, or obvious deformity. Skin:  Warm and dry, no erythema.  Data Reviewed: I have personally reviewed following labs and imaging studies  CBC: Recent Labs  Lab 01/03/23 0450 01/04/23 1011 01/08/23 0253 01/08/23 0856  WBC 11.4* 9.9 11.5*  --   NEUTROABS  --  8.5*  --   --   HGB 8.7* 7.5* 6.9* 6.7*  HCT 29.1* 24.1* 21.3* 21.7*  MCV 93.6 93.1 91.8  --   PLT 207 230 276  --    Basic Metabolic Panel: Recent Labs  Lab 01/02/23 1734 01/03/23 0450 01/03/23 0450 01/04/23 1011 01/06/23 0817 01/07/23 0809 01/08/23 0253 01/09/23 0359  NA  --  138   < > 137 131* 129* 131* 127*  K  --  4.4   < > 4.2 4.8 5.3* 5.5* 5.9*  CL  --  110   < > 103 102 98 98 98  CO2  --  20*   < > 19* 23 21* 24 20*  GLUCOSE  --  99   < > 146* 152* 96 121* 146*  BUN  --  26*   < > 31* 48* 67* 77* 86*  CREATININE  --  1.39*   < > 1.45* 1.67* 1.88* 2.11* 2.22*  CALCIUM  --  9.1   < > 8.7* 8.5* 8.8* 8.6* 8.7*  MG 1.8 2.0  --  1.9  --   --  2.2  --   PHOS 2.1* 2.4*  --   --   --   --   --   --    < > = values in this interval not displayed.   GFR: Estimated Creatinine Clearance: 25.7 mL/min (A) (by C-G formula based on SCr of 2.22 mg/dL (H)). Liver Function Tests: Recent Labs  Lab 01/06/23 0817  AST 30  ALT 39  ALKPHOS 75  BILITOT 0.4  PROT 5.3*  ALBUMIN 2.1*   Cardiac Enzymes: Recent Labs  Lab 01/07/23 0809  CKTOTAL 20*   CBG: Recent Labs  Lab 01/08/23 1101 01/08/23 1615 01/08/23 1939 01/09/23 0002 01/09/23 0347  GLUCAP 99 79 114* 122* 133*    Recent Results (from the past 240 hour(s))  Culture, blood (Routine X 2) w Reflex to ID Panel     Status: Abnormal   Collection Time: 12/31/22  7:37 AM   Specimen: BLOOD RIGHT HAND  Result Value Ref Range Status   Specimen Description BLOOD RIGHT HAND  Final   Special Requests   Final    BOTTLES  DRAWN AEROBIC AND ANAEROBIC Blood Culture adequate volume   Culture  Setup Time   Final    GRAM POSITIVE COCCI IN CLUSTERS ANAEROBIC BOTTLE ONLY CRITICAL RESULT CALLED TO, READ BACK BY AND VERIFIED WITH: PHARMD  J. LEDFORD 01/01/23 @ 0330 BY AB Performed at [facility], [address], [place], [place] [address]    Culture METHICILLIN RESISTANT STAPHYLOCOCCUS AUREUS (A)  Final   Report Status 01/03/2023 FINAL  Final   Organism ID, Bacteria METHICILLIN RESISTANT STAPHYLOCOCCUS AUREUS  Final      Susceptibility   Methicillin resistant staphylococcus aureus - MIC*    CIPROFLOXACIN >=8 RESISTANT Resistant     ERYTHROMYCIN >=8 RESISTANT Resistant     GENTAMICIN <=0.5 SENSITIVE Sensitive     OXACILLIN >=4 RESISTANT Resistant     TETRACYCLINE <=1 SENSITIVE Sensitive     VANCOMYCIN 1 SENSITIVE Sensitive     TRIMETH/SULFA >=320 RESISTANT Resistant     CLINDAMYCIN <=0.25 SENSITIVE Sensitive     RIFAMPIN <=0.5 SENSITIVE Sensitive     Inducible Clindamycin NEGATIVE Sensitive     LINEZOLID 2 SENSITIVE Sensitive     * METHICILLIN RESISTANT STAPHYLOCOCCUS AUREUS  Blood Culture ID Panel (Reflexed)     Status: Abnormal   Collection Time: 12/31/22  7:37 AM  Result Value Ref Range Status   Enterococcus faecalis NOT DETECTED NOT DETECTED Final   Enterococcus Faecium NOT DETECTED NOT DETECTED Final   Listeria monocytogenes NOT DETECTED NOT DETECTED Final   Staphylococcus species DETECTED (A) NOT DETECTED Final    Comment: CRITICAL RESULT CALLED TO, READ BACK BY AND VERIFIED WITH: PHARMD J. LEDFORD 01/01/23 @ 0330 BY AB    Staphylococcus aureus (BCID) DETECTED (A) NOT DETECTED Final    Comment: Methicillin (oxacillin)-resistant Staphylococcus aureus (MRSA). MRSA is predictably resistant to beta-lactam antibiotics (except ceftaroline). Preferred therapy is vancomycin unless clinically contraindicated. Patient requires contact precautions if  hospitalized. CRITICAL RESULT CALLED TO, READ BACK BY  AND VERIFIED WITH: PHARMD J. LEDFORD 01/01/23 @ 0330 BY AB    Staphylococcus epidermidis NOT DETECTED NOT DETECTED Final   Staphylococcus lugdunensis NOT DETECTED NOT DETECTED Final   Streptococcus species NOT DETECTED NOT DETECTED Final   Streptococcus agalactiae NOT DETECTED NOT DETECTED Final   Streptococcus pneumoniae NOT DETECTED NOT DETECTED Final   Streptococcus pyogenes NOT DETECTED NOT DETECTED Final   A.calcoaceticus-baumannii NOT DETECTED NOT DETECTED Final   Bacteroides fragilis NOT DETECTED NOT DETECTED Final   Enterobacterales NOT DETECTED NOT DETECTED Final   Enterobacter cloacae complex NOT DETECTED NOT DETECTED Final   Escherichia coli NOT DETECTED NOT DETECTED Final   Klebsiella aerogenes NOT DETECTED NOT DETECTED Final   Klebsiella oxytoca NOT DETECTED NOT DETECTED Final   Klebsiella pneumoniae NOT DETECTED NOT DETECTED Final   Proteus species NOT DETECTED NOT DETECTED Final   Salmonella species NOT DETECTED NOT DETECTED Final   Serratia marcescens NOT DETECTED NOT DETECTED Final   Haemophilus influenzae NOT DETECTED NOT DETECTED Final   Neisseria meningitidis NOT DETECTED NOT DETECTED Final   Pseudomonas aeruginosa NOT DETECTED NOT DETECTED Final   Stenotrophomonas maltophilia NOT DETECTED NOT DETECTED Final   Candida albicans NOT DETECTED NOT DETECTED Final   Candida auris NOT DETECTED NOT DETECTED Final   Candida glabrata NOT DETECTED NOT DETECTED Final   Candida krusei NOT DETECTED NOT DETECTED Final   Candida parapsilosis NOT DETECTED NOT DETECTED Final   Candida tropicalis NOT DETECTED NOT DETECTED Final   Cryptococcus neoformans/gattii NOT DETECTED NOT DETECTED Final   Meth resistant mecA/C and MREJ DETECTED (A) NOT DETECTED Final    Comment: CRITICAL RESULT CALLED TO, READ BACK BY AND VERIFIED WITH: PHARMD J. LEDFORD 01/01/23 @ 0330 BY AB Performed at [facility], [address], [place], [place] [address]   Culture, blood (Routine X 2) w Reflex to  ID Panel     Status: None   Collection Time: 12/31/22  7:43 AM   Specimen: BLOOD RIGHT HAND  Result Value Ref Range Status   Specimen Description BLOOD RIGHT HAND  Final   Special Requests   Final    BOTTLES DRAWN AEROBIC ONLY Blood Culture adequate volume   Culture   Final    NO GROWTH 5 DAYS Performed at [facility], [address] [place], [place] [address]    Report Status 01/05/2023 FINAL  Final  Culture, blood (Routine X 2) w Reflex to ID Panel     Status: None   Collection Time: 01/02/23  3:51 PM   Specimen: BLOOD LEFT HAND  Result Value Ref Range Status   Specimen Description BLOOD LEFT HAND  Final   Special Requests   Final    BOTTLES DRAWN AEROBIC AND ANAEROBIC Blood Culture adequate volume   Culture   Final    NO GROWTH 5 DAYS Performed at [facility], [address] [place], [place] [address]    Report Status 01/07/2023 FINAL  Final  Culture, blood (Routine X 2) w Reflex to ID Panel     Status: None   Collection Time: 01/02/23  3:51 PM   Specimen: BLOOD RIGHT HAND  Result Value Ref Range Status   Specimen Description BLOOD RIGHT HAND  Final   Special Requests   Final    BOTTLES DRAWN AEROBIC ONLY Blood Culture results may not be optimal due to an inadequate volume of blood received in culture bottles   Culture   Final    NO GROWTH 5 DAYS Performed at [facility], [address] [place], [place] [address]    Report Status 01/07/2023 FINAL  Final    Radiology Studies: No results found.  Scheduled Meds:  acetaminophen  1,000 mg Oral TID   amiodarone  400 mg Oral BID   apixaban  5 mg Oral BID   atorvastatin  20 mg Oral QHS   budesonide (PULMICORT) nebulizer solution  0.25 mg Nebulization BID   diclofenac Sodium  4 g Topical QID   feeding supplement  237 mL Oral BID BM   feeding supplement (OSMOLITE 1.5 CAL)  1,000 mL Per Tube Q24H   feeding supplement (PROSource TF20)  60 mL Per Tube Daily   gabapentin  100 mg Oral QHS   Gerhardt's  butt cream   Topical BID   ipratropium-albuterol  3 mL Nebulization BID   lidocaine  2 patch Transdermal Q24H   megestrol  400 mg Oral BID   metoprolol tartrate  75 mg Oral BID   midodrine  5 mg Oral TID WC   multivitamin  1 tablet Oral QHS   pantoprazole  40 mg Oral BID   pyridostigmine  60 mg Oral Q8H   sertraline  25 mg Oral Daily   sucralfate  1 g Oral TID WC & HS   ticagrelor  90 mg Oral BID   Or   ticagrelor  90 mg Per Tube BID   Continuous Infusions:  sodium chloride Stopped (01/06/23 0517)   DAPTOmycin (CUBICIN) 600 mg in sodium chloride 0.9 % IVPB      LOS: 11 days   Time spent: 55 minutes  Azucena Fallen, DO [facility]  If 7PM-7AM, please contact night-coverage [email or web address] 01/09/2023, 7:16 AM

## 2023-01-09 NOTE — Plan of Care (Signed)
  Problem: Clinical Measurements: Goal: Respiratory complications will improve Outcome: Progressing   

## 2023-01-09 NOTE — Progress Notes (Signed)
Dr. Natale Milch notified about potassium of 6.7. Came in to administer meds and B/P was 81/61 and patient very hard to arouse. Dr. Natale Milch called and he came to bedside. Patient became more alert and B/P came up to 121/61. Will administer bolus and recheck potassium level at 1730. Rapid response made aware of situation and came by to see patient but patient is doing better at this time. Will continue B/P checks Q15 minutes and will continue to monitor patient.

## 2023-01-09 NOTE — Progress Notes (Signed)
Patient continued with RR from 8-10, size 1 pupils and very lethargic. Helle with rapid came to check on patient and we gave patient 0.4mg  Narcan and he responded well. RR up to 17, patient awake and talking and B/P 101/60. Dr. Natale Milch made aware and also updated him on recent potassium level of 5.3.

## 2023-01-09 NOTE — Progress Notes (Signed)
   01/09/23 1635  Assess: MEWS Score  Temp 97.7 F (36.5 C)  BP (!) 81/61  MAP (mmHg) 70  ECG Heart Rate 78  Resp 10  Level of Consciousness Responds to Voice  SpO2 100 %  O2 Device Nasal Cannula  O2 Flow Rate (L/min) 2 L/min  Assess: MEWS Score  MEWS Temp 0  MEWS Systolic 1  MEWS Pulse 0  MEWS RR 1  MEWS LOC 1  MEWS Score 3  MEWS Score Color Yellow  Assess: if the MEWS score is Yellow or Red  Were vital signs accurate and taken at a resting state? Yes  Does the patient meet 2 or more of the SIRS criteria? Yes  Does the patient have a confirmed or suspected source of infection? Yes  MEWS guidelines implemented  Yes, yellow  Treat  MEWS Interventions Considered administering scheduled or prn medications/treatments as ordered  Take Vital Signs  Increase Vital Sign Frequency  Yellow: Q2hr x1, continue Q4hrs until patient remains green for 12hrs  Escalate  MEWS: Escalate Yellow: Discuss with charge nurse and consider notifying provider and/or RRT  Notify: Charge Nurse/RN  Name of Charge Nurse/RN Notified [facility]  Provider Notification  Provider Name/Title Natale Milch, MD  Date Provider Notified 01/09/23  Time Provider Notified 1630  Method of Notification Call;Face-to-face  Notification Reason Change in status  Provider response At bedside  Date of Provider Response 01/09/23  Time of Provider Response 1630  Notify: Rapid Response  Name of Rapid Response RN Notified Helle  Date Rapid Response Notified 01/09/23  Time Rapid Response Notified 1630  Assess: SIRS CRITERIA  SIRS Temperature  0  SIRS Pulse 0  SIRS Respirations  0  SIRS WBC 1  SIRS Score Sum  1

## 2023-01-10 DIAGNOSIS — Z66 Do not resuscitate: Secondary | ICD-10-CM

## 2023-01-10 LAB — BASIC METABOLIC PANEL
Anion gap: 12 (ref 5–15)
Anion gap: 10 (ref 5–15)
Anion gap: 11 (ref 5–15)
Anion gap: 10 (ref 5–15)
BUN: 99 mg/dL — ABNORMAL HIGH (ref 8–23)
BUN: 97 mg/dL — ABNORMAL HIGH (ref 8–23)
BUN: 99 mg/dL — ABNORMAL HIGH (ref 8–23)
BUN: 98 mg/dL — ABNORMAL HIGH (ref 8–23)
CO2: 21 mmol/L — ABNORMAL LOW (ref 22–32)
CO2: 24 mmol/L (ref 22–32)
CO2: 22 mmol/L (ref 22–32)
CO2: 24 mmol/L (ref 22–32)
Calcium: 8.7 mg/dL — ABNORMAL LOW (ref 8.9–10.3)
Calcium: 8.9 mg/dL (ref 8.9–10.3)
Calcium: 8.8 mg/dL — ABNORMAL LOW (ref 8.9–10.3)
Calcium: 8.7 mg/dL — ABNORMAL LOW (ref 8.9–10.3)
Chloride: 100 mmol/L (ref 98–111)
Chloride: 98 mmol/L (ref 98–111)
Chloride: 99 mmol/L (ref 98–111)
Chloride: 99 mmol/L (ref 98–111)
Creatinine, Ser: 2.41 mg/dL — ABNORMAL HIGH (ref 0.61–1.24)
Creatinine, Ser: 2.42 mg/dL — ABNORMAL HIGH (ref 0.61–1.24)
Creatinine, Ser: 2.42 mg/dL — ABNORMAL HIGH (ref 0.61–1.24)
Creatinine, Ser: 2.45 mg/dL — ABNORMAL HIGH (ref 0.61–1.24)
GFR, Estimated: 26 mL/min — ABNORMAL LOW (ref >60)
GFR, Estimated: 26 mL/min — ABNORMAL LOW (ref >60)
GFR, Estimated: 26 mL/min — ABNORMAL LOW (ref >60)
GFR, Estimated: 26 mL/min — ABNORMAL LOW (ref >60)
Glucose, Bld: 120 mg/dL — ABNORMAL HIGH (ref 70–99)
Glucose, Bld: 106 mg/dL — ABNORMAL HIGH (ref 70–99)
Glucose, Bld: 107 mg/dL — ABNORMAL HIGH (ref 70–99)
Glucose, Bld: 121 mg/dL — ABNORMAL HIGH (ref 70–99)
Potassium: 5.7 mmol/L — ABNORMAL HIGH (ref 3.5–5.1)
Potassium: 5.6 mmol/L — ABNORMAL HIGH (ref 3.5–5.1)
Potassium: 5.7 mmol/L — ABNORMAL HIGH (ref 3.5–5.1)
Potassium: 5.1 mmol/L (ref 3.5–5.1)
Sodium: 133 mmol/L — ABNORMAL LOW (ref 135–145)
Sodium: 132 mmol/L — ABNORMAL LOW (ref 135–145)
Sodium: 132 mmol/L — ABNORMAL LOW (ref 135–145)
Sodium: 133 mmol/L — ABNORMAL LOW (ref 135–145)

## 2023-01-10 LAB — GLUCOSE, CAPILLARY
Glucose-Capillary: 111 mg/dL — ABNORMAL HIGH (ref 70–99)
Glucose-Capillary: 132 mg/dL — ABNORMAL HIGH (ref 70–99)
Glucose-Capillary: 107 mg/dL — ABNORMAL HIGH (ref 70–99)
Glucose-Capillary: 97 mg/dL (ref 70–99)
Glucose-Capillary: 122 mg/dL — ABNORMAL HIGH (ref 70–99)

## 2023-01-10 LAB — CBC: Hemoglobin: 7.7 g/dL — ABNORMAL LOW (ref 13.0–17.0)

## 2023-01-10 MED ORDER — FUROSEMIDE 20 MG PO TABS
20.0000 mg | ORAL_TABLET | Freq: Two times a day (BID) | ORAL | Status: DC
  Administered 2023-01-10 – 2023-01-12 (×5): 20 mg via ORAL
  Filled 2023-01-10 (×5): qty 1

## 2023-01-10 MED ORDER — DOCUSATE SODIUM 100 MG PO CAPS
100.0000 mg | ORAL_CAPSULE | Freq: Two times a day (BID) | ORAL | Status: DC
  Administered 2023-01-10 – 2023-01-15 (×8): 100 mg via ORAL
  Filled 2023-01-10 (×8): qty 1

## 2023-01-10 MED ORDER — TRAMADOL HCL 50 MG PO TABS
50.0000 mg | ORAL_TABLET | Freq: Four times a day (QID) | ORAL | Status: DC | PRN
  Administered 2023-01-10 – 2023-01-11 (×4): 50 mg via ORAL
  Filled 2023-01-10 (×4): qty 1

## 2023-01-10 MED ORDER — SODIUM CHLORIDE 0.9 % IV SOLN: INTRAVENOUS | Status: AC

## 2023-01-10 MED ORDER — NEPRO/CARBSTEADY PO LIQD
1000.0000 mL | ORAL | Status: DC
  Administered 2023-01-10: 1000 mL
  Filled 2023-01-10: qty 1000

## 2023-01-10 MED ORDER — HYDROMORPHONE HCL 1 MG/ML IJ SOLN: 0.5000 mg | Freq: Once | INTRAMUSCULAR | Status: DC

## 2023-01-10 MED ORDER — HYDROMORPHONE HCL 1 MG/ML IJ SOLN
0.5000 mg | Freq: Once | INTRAMUSCULAR | Status: AC | PRN
  Administered 2023-01-10: 0.5 mg via INTRAVENOUS
  Filled 2023-01-10: qty 0.5

## 2023-01-10 MED ORDER — ENSURE ENLIVE PO LIQD: 237.0000 mL | Freq: Three times a day (TID) | ORAL | Status: DC

## 2023-01-10 MED ORDER — SODIUM ZIRCONIUM CYCLOSILICATE 10 G PO PACK
10.0000 g | PACK | Freq: Two times a day (BID) | ORAL | Status: DC
  Administered 2023-01-10 – 2023-01-15 (×7): 10 g via ORAL
  Filled 2023-01-10 (×8): qty 1

## 2023-01-10 NOTE — Progress Notes (Signed)
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  - Continue renal MVI daily  - Continue to offer Ensure Enlive po TID, each supplement provides 350 kcal and 20 grams of protein  - Continue feeding assistance with meals  NUTRITION DIAGNOSIS:   Severe Malnutrition related to chronic illness (HFrEF, COPD, CKD stage IIIb) as evidenced by severe fat depletion, severe muscle depletion.  Ongoing, being addressed via supplements and TF  GOAL:   Patient will meet greater than or equal to 90% of their needs  Progressing  MONITOR:   PO intake, Supplement acceptance, Labs, Weight trends, TF tolerance, Skin, I & O's  REASON FOR ASSESSMENT:   Consult Enteral/tube feeding initiation and management  ASSESSMENT:   82 year old male who presented to [facility] ED on 7/12 with SOB. PMH of HFrEF, COPD, atrial fibrillation, CKD stage IIIb, HTN, CAD s/p prior PCI. Pt admitted with acute on chronic combined systolic and diastolic CHF, acute on chronic respiratory failure with hypoxia, sepsis, MRSA bacteremia.  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 PO intake is minimal with meal completions charted as 0-25% for the last 8 documented meals. Pt is not meeting his needs via PO route.  Pt working with OT at time of RD  visit. Discussed pt with Pharmacy. Will change tube feeding formula to a lower potassium option to see if this helps with persistent hyperkalemia. Pt currently on scheduled lokelma.  Spoke with RN who reports pt is only eating fruit and jello. Pt has been sitting on the Ensure supplements and has not been finishing them. Low suspicion that Ensure supplements are contributing to hyperkalemia given pt is only consuming sips throughout the day.  Weight up almost 10 kg this admission. Pt is net positive 8.3 L. Pt with mild pitting generalized edema, mild pitting edema to BUE, and moderate pitting edema to BLE.  Palliative Medicine continues to follow pt for GOC discussions.  Admit weight: 67.2 kg Current weight: 76.6 kg  Meal Completion: 0-25% x last 8 documented meals  Medications reviewed and include: Ensure Enlive BID, colace, lasix 20 mg BID, megace 400 mg BID, rena-vit, protonix, lokelma 10 grams BID, sucralfate, IV abx  Labs reviewed: sodium 133, potassium 5.7, BUN 99, creatinine 2.41, WBC 10.9, hemoglobin 7.7, BMP from this afternoon pending CBG's: 99-193 x 24 hours  UOP: 3200 ml x 24 hours I/O's: +8.3 L since admit  Diet Order:   Diet Order             Diet regular Room service appropriate? Yes with Assist; Fluid consistency: Thin  Diet effective now                   EDUCATION NEEDS:   Not appropriate for education at this time  Skin:  Skin Assessment: Skin Integrity Issues: Stage I:  L lip (MDRI) Stage II: bilateral buttocks  Last BM:  01/10/23 type 6  Height:   Ht Readings from Last 1 Encounters:  01/07/23 5\' 8"  (1.727 m)    Weight:   Wt Readings from Last 1 Encounters:  01/10/23 76.6 kg    BMI:  Body mass index is 25.68 kg/m.  Estimated Nutritional Needs:   Kcal:  1750-1950  Protein:  90-110 grams  Fluid:  1.7-1.9 L    Mertie Clause, MS, RD, LDN Inpatient Clinical Dietitian Please see AMiON for contact information.

## 2023-01-10 NOTE — Progress Notes (Signed)
Physical Therapy Treatment Patient Details Name: Matthew Zamora MRN: [id number] DOB: [birth year] Today's Date: 01/10/2023   History of Present Illness 82 yo male admitted to [facility] 7/12 with SOB with decompensated HF. 7/13 hypotension with sepsis. 7/14 Rt weakness with code stroke and transfer to [facility], s/p Lt ICA thrombectomy with residual stenosis ultimately requiring left ICA stent placement.  Extubated post procedure but very confused. PMhx: COPD, CHF, AFib, CKD, HTN    PT Comments  Pt making limited progress toward goals due likely from anxiety, but also from significant deconditioning.  Emphasis on warm up exercise of u and LE's, work on transitions, sit to stands and pivotal transfer with the RW to the recliner.  Pt needed frequent rest and redirection.     Assistance Recommended at Discharge Frequent or constant Supervision/Assistance  If plan is discharge home, recommend the following:  Can travel by private vehicle    Two people to help with walking and/or transfers;Two people to help with bathing/dressing/bathroom;Assist for transportation;Assistance with cooking/housework;Help with stairs or ramp for entrance;Direct supervision/assist for medications management   No  Equipment Recommendations  None recommended by PT    Recommendations for Other Services       Precautions / Restrictions Precautions Precautions: Fall     Mobility  Bed Mobility Overal bed mobility: Needs Assistance Bed Mobility: Supine to Sit     Supine to sit: HOB elevated, Mod assist     General bed mobility comments: cues and truncal assist up via L elbow to sitting and then stability and boost assist to scoot to EOB    Transfers Overall transfer level: Needs assistance Equipment used: Rolling walker (2 wheels) Transfers: Sit to/from Stand, Bed to chair/wheelchair/BSC Sit to Stand: Mod assist Stand pivot transfers: Mod assist (+2 not available)         General transfer comment:  Immediately wanting to sit, but cued to stay up and finish small pivoting to the chair.    Ambulation/Gait               General Gait Details: NT   Stairs             Wheelchair Mobility     Tilt Bed    Modified Rankin (Stroke Patients Only) Modified Rankin (Stroke Patients Only) Pre-Morbid Rankin Score: Moderate disability Modified Rankin: Severe disability     Balance Overall balance assessment: Needs assistance Sitting-balance support: Feet supported Sitting balance-Leahy Scale: Fair Sitting balance - Comments: Prefers UE assist, but able to sit without UEs.  Difficulty w/shifting and scooting assymetrically     Standing balance-Leahy Scale: Poor Standing balance comment: UE support for balance on RW and min to mod A while second person performing hygiene due to BM                            Cognition Arousal/Alertness: Awake/alert Behavior During Therapy: Restless, Anxious Overall Cognitive Status:  (Some confusion, redirection needed, NT formally)                                 General Comments: VSS, elevated HR, acceptable sats.        Exercises General Exercises - Lower Extremity Ankle Circles/Pumps: AROM, Both, 10 reps, AAROM, Supine Heel Slides: AROM, Both, 10 reps (graded resistance) Hip ABduction/ADduction: AAROM, Both, 10 reps, Supine Other Exercises Other Exercises: bicep/tricep presses with graded assist/resistance x 10 reps.  Frequent need to redirect.    General Comments General comments (skin integrity, edema, etc.): Again, SpO2 >92% on 3L [place] throughout session, HR 110s-120s      Pertinent Vitals/Pain Pain Assessment Pain Assessment: Faces Faces Pain Scale: Hurts little more Pain Location: shoulders, knees Pain Descriptors / Indicators: Grimacing, Guarding, Moaning Pain Intervention(s): Limited activity within patient's tolerance, Monitored during session, Repositioned    Home Living                           Prior Function            PT Goals (current goals can now be found in the care plan section) Acute Rehab PT Goals Patient Stated Goal: to return home PT Goal Formulation: With patient Time For Goal Achievement: 01/13/23 Potential to Achieve Goals: Fair Progress towards PT goals: Progressing toward goals    Frequency    Min 1X/week      PT Plan Current plan remains appropriate    Co-evaluation              AM-PAC PT "6 Clicks" Mobility   Outcome Measure  Help needed turning from your back to your side while in a flat bed without using bedrails?: A Lot Help needed moving from lying on your back to sitting on the side of a flat bed without using bedrails?: A Lot Help needed moving to and from a bed to a chair (including a wheelchair)?: A Lot Help needed standing up from a chair using your arms (e.g., wheelchair or bedside chair)?: Total Help needed to walk in hospital room?: Total Help needed climbing 3-5 steps with a railing? : Total 6 Click Score: 9    End of Session Equipment Utilized During Treatment: Oxygen Activity Tolerance: Patient limited by fatigue Patient left: in chair;with call bell/phone within reach;with chair alarm set Nurse Communication: Mobility status PT Visit Diagnosis: Muscle weakness (generalized) (M62.81);Other symptoms and signs involving the nervous system (R29.898)     Time: 1191-4782 PT Time Calculation (min) (ACUTE ONLY): 36 min  Charges:    $Therapeutic Exercise: 8-22 mins $Therapeutic Activity: 8-22 mins PT General Charges $$ ACUTE PT VISIT: 1 Visit                     01/10/2023  Jacinto Halim., PT Acute Rehabilitation Services [phone]  (office)   [name] 01/10/2023, 1:49 PM

## 2023-01-10 NOTE — TOC Progression Note (Signed)
Transition of Care [facility]) - Progression Note    Patient Details  Name: Matthew Zamora MRN: [id number] Date of Birth: 1941-01-18  Transition of [facility]) CM/SW Contact  Leander Rams, LCSW Phone Number: 01/10/2023, 12:31 PM  Clinical Narrative:    CSW called son Matthew Zamora to inform him that [facility] has offered pt admission. Son accepted offered.   Auth will start as pt gets closer to dc. TOC wil continue to follow.   Expected Discharge Plan: Skilled Nursing Facility Barriers to Discharge: Continued Medical Work up, SNF Pending bed offer  Expected Discharge Plan and Services     Post Acute Care Choice: Skilled Nursing Facility Living arrangements for the past 2 months: Single Family Home                                       Social Determinants of Health (SDOH) Interventions SDOH Screenings   Food Insecurity: No Food Insecurity (12/17/2022)  Housing: Patient Unable To Answer (12/17/2022)  Transportation Needs: No Transportation Needs (12/17/2022)  Utilities: Not At Risk (12/17/2022)  Financial Resource Strain: Low Risk  (12/05/2021)   Received from [facility], [facility]  Tobacco Use: Medium Risk (01/07/2023)    Readmission Risk Interventions    12/28/2022    9:53 AM 12/18/2022    4:18 PM 04/22/2022    3:54 PM  Readmission Risk Prevention Plan  Transportation Screening Complete Complete Complete  Medication Review Oceanographer) Complete Complete Complete  PCP or Specialist appointment within 3-5 days of discharge Complete Complete Complete  HRI or Home Care Consult Complete Complete Complete  SW Recovery Care/Counseling Consult Complete Complete Complete  Palliative Care Screening Not Applicable Not Applicable Not Applicable  Skilled Nursing Facility Complete Not Applicable Not Applicable   Oletta Lamas, MSW, LCSWA, LCASA Transitions of Care  Clinical Social Worker I

## 2023-01-10 NOTE — Progress Notes (Signed)
Patient having pain in the back of the neck and also his ankles bilaterally. Heat pack placed on back of neck, as well as under each ankle. Oxy was discontinued earlier today after Narcan necessary for slow RR. Patient is now specifically asking for pain medication. MD paged.

## 2023-01-10 NOTE — Progress Notes (Addendum)
Palliative Medicine Inpatient Follow Up Note HPI: 82 year old man with myriad of comorobidities including refractory orthostatic hypotension, HFrEF, chronic N/V who presented w/ SOB at [facility] acteremic with MRSA as well has + for rhinovirus. (+( R sided weakness w/ L ICA thrombus transferred to [facility] for emergent thrombectomy.    Palliative care has been asked to get involved in the setting of multiple readmissions, high chronic disease burden, and declined health to further discuss goals of care.   Today's Discussion 01/10/2023  *Please note that this is a verbal dictation therefore any spelling or grammatical errors are due to the "Dragon Medical One" system interpretation.  Chart reviewed inclusive of vital signs, progress notes, laboratory results, and diagnostic images.   A family meeting was held this afternoon in the presence of patients sons, Gaspar Garbe, ex-wife Katie, and DIL, Granger. Providers present were Dr. Natale Milch and myself.   Dr. Natale Milch was able to stop in to provide a comprehensive update on patients medical state. He reviewed that Aedon has had a rough almost two weeks. He discussed patients thrombectomy and sepsis. He reviewed the ongoing concern in the setting of Cameryn's MRSA bacteremia. He further reviewed that his device cannot be extracted therefore he will be on lifelong antibiotics. Dr. Natale Milch reviewed the present concerns in the setting of patients recent dark stool and consistent hyperkalemia.   The chronic disease trajectory was further explained. We reviewed patients wishes in the context of his quality of life. He shares the desire to be able to do for himself and mobilize. If he is confined to being in bed for the rest of his life then this would not align with his wishes. He goes on to share that he would not want to circle in and out of the hospital indefinitely. We did discuss hospice should this be a trend that occurs moving forward.   I  completed a MOST form today. The patient and family outlined their wishes for the following treatment decisions:  Cardiopulmonary Resuscitation: Do Not Attempt Resuscitation (DNR/No CPR)  Medical Interventions: Limited Additional Interventions: Use medical treatment, IV fluids and cardiac monitoring as indicated, DO NOT USE intubation or mechanical ventilation. May consider use of less invasive airway support such as BiPAP or CPAP. Also provide comfort measures. Transfer to the hospital if indicated. Avoid intensive care.   Antibiotics: Antibiotics if indicated  IV Fluids: IV fluids if indicated  Feeding Tube: Feeding tube for a defined trial period   Questions and concerns addressed/Palliative Support Provided.   Objective Assessment: Vital Signs Vitals:   01/10/23 0908 01/10/23 0909  BP:    Pulse:    Resp:    Temp:    SpO2: 94% 95%    Intake/Output Summary (Last 24 hours) at 01/10/2023 1438 Last data filed at 01/10/2023 0321 Gross per 24 hour  Intake 120 ml  Output 2000 ml  Net -1880 ml   Last Weight  Most recent update: 01/10/2023  3:21 AM    Weight  76.6 kg (168 lb 14 oz)            Gen:  Elderly Caucasian M chronically ill appearing HEENT: Coretrack in place, dry mucous membranes CV: regular rate and  Irregular rhythm  PULM:  On 3LPM [place], breathing is even and nonlabored ABD: soft/nontender  EXT: No edema  Neuro: Alert and oriented x3  SUMMARY OF RECOMMENDATIONS   DNAR/DNI  MOST Completed, paper copy placed onto the chart electric copy can be found in  Vynca  DNR Form Completed, paper copy placed onto the chart electric copy can be found in Vynca   Open and honest conversations held in the setting of patients current state - In regard to quality of life patient shares it would not be acceptable if he were confined to a bed for the rest of his life. He also shares he would not want to cycle in and out of the hospital repetitively    Plan for SNF placement at  [facility]   Ongoing PMT support  Total Time: 43 Billing based on MDM: High ______________________________________________________________________________________ Lamarr Lulas [place] Palliative Medicine Team Team Cell Phone: [phone] Please utilize secure chat with additional questions, if there is no response within 30 minutes please call the above phone number  Palliative Medicine Team providers are available by phone from 7am to 7pm daily and can be reached through the team cell phone.  Should this patient require assistance outside of these hours, please call the patient's attending physician.

## 2023-01-10 NOTE — Progress Notes (Signed)
PROGRESS NOTE    Matthew Zamora  OZH:[id number] DOB: 1940-09-06 DOA: 12/29/2022 PCP: Jerl Mina, MD   Brief Narrative:  Patient is an 82 year old male with past medical history significant for refractory orthostatic hypotension, CHF with reduced EF, chronic nausea and vomiting.  Patient initially presented to [facility] with acute shortness of breath having found to be in heart failure exacerbation exacerbated by A-fib with RVR.  At [facility] patient was hypotensive requiring fluids placed on amiodarone drip with profoundly elevated fever placed on broad-spectrum antibiotics with cultures ultimately presenting with MRSA bacteremia.  Respiratory viral panel was also positive at this time for rhinovirus.  Acutely 7/14 patient suffered right sided acute weakness and aphasia CTA showing acute left occlusion of left ICA patient evaluated by neurology recommending transfer to [facility] for evaluation by neurointerventional radiology with left carotid stent placement.  Infectious disease team consulted given MRSA positive bacteremia, initial plan was for 6 weeks IV vancomycin(now daptomycin) and transition to doxycycline for life/ongoing prophylaxis.  Discussed transitioning to linezolid as well if more appropriate.  Complicating this is implanted device, AICD, which is unable to be properly evaluated for concurrent infection given TEE is likely not going to be tolerated by the patient.  EP following along, appreciate insight and recommendations - patient appears to be poor candidate for device extraction as well.  7/24 -patient now noted to have melena with downtrending hemoglobin concerning for occult GI bleed.  Will discuss with team given patient's current medications include Eliquis and Brilinta given recent stent placement. Spoke with NeuroIR Dr Corliss Skains - if we have to hold medications would start with holding Brilinta alone. Discussed with family the risks/benefits of holding anti-plt/anticoagulation  in this patient.  7/26 -lengthy goals of care conversation with patient, 3 sons and their wives in regards to patient's current medical status, risks of decompensation and general morbidity mortality given his multiple diagnoses.  Patient indicates he would not want a feeding tube and is currently filling out a MOST form with family and palliative care so that we have a better idea of his wishes moving forward.  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 -NeuroIR planning to continue Eliquis, Brilinta. (Discussed with Dr. Antionette Poles may need to hold Brilinta in the setting of possible GI bleed, would continue Eliquis unless absolutely necessary to discontinue) -currently continued, black stools resolved -Continue statin.  PT/OT-SNF   Acute symptomatic anemia, rule out acute GI bleed  -Downtrending hemoglobin s/p 2 unit PRBC transfused 7/24 -Hgb continues to downtrend - no further dark/black stools reported -Continue Brilinta and Eliquis for now-remains high risk for thrombotic event with new carotid stent  Hyperkalemia, ongoing, profound -Potassium continues to rise over the past 72 hours without clear indication, pharmacy to review medications for possible interaction versus offending medications. -Rapid response 7/25 for potassium of 6.7 despite previous doses of Lokelma.  Patient received hyperkalemia pathway with moderate improvement in potassium down to 5.1 -Potassium again uptrending today despite Lokelma -potassium downtrending slowly with new regimen: initiate higher dose lokelma scheduled, lasix monitor renal function  MRSA bacteremia, potential source from cardiac device, resolving Septic shock, resolved: -Repeat cultures have been negative so far. -ID consulted -If  TEE cannot be performed, completed 6 weeks of IV daptomycin, then doxycycline for life (i.e. depending on goals of  care).  Zyvox is an option as well.  -CT scan does not show any evidence of cervical/thoracic discitis.   -Patient has an ICD in place with concerns of vegetation.  Currently no plans for extraction but likely will need TEE first to evaluate the device which would be a moderate to high risk procedure - anticoagulation will also need to be held for this to move forward which is complicated by above symptomatic anemia as well as recent stent and thrombectomy and chronic A-fib.   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  No evidence of discitis seen on CT.  CK normal.  Pain control, physical therapy.  Voltaren gel   Acute on chronic CKD stage IIIb NAGMA Hyponatremia, improving Baseline creatinine around 1.5, essentially back to baseline.   Normocytic anemia, likely anemia of chronic disease Hemoglobin stable at 8.3   Thrombocytopenia Platelets had drifted down to 92 now slowly recovering    GERD -PPI   Chronic hypotension On midodrine 30 mg 3 times daily, Mestinon   Depression/anxiety -Continue home sertraline 25 mg daily    Acute metabolic encephalopathy secondary to sepsis, improved Improved  DVT prophylaxis: Eliquis Code Status: Full code Family Communication: Family at bedside today for goals of care discussion, 3 sons,  daughter-in-law [name]   Consultants:  ID Neurointerventional radiology EP Palliative care  Procedures:  Right carotid thrombectomy and stent placement  Antimicrobials:  Anti-infectives (From admission, onward)    Start     Dose/Rate Route Frequency Ordered Stop   01/09/23 1400  DAPTOmycin (CUBICIN) 600 mg in sodium chloride 0.9 % IVPB        8 mg/kg  75.8 kg 124 mL/hr over 30 Minutes Intravenous Every 48 hours 01/08/23 0814     01/07/23 1400  DAPTOmycin (CUBICIN) 600 mg in sodium chloride 0.9 % IVPB  Status:  Discontinued        8 mg/kg  75.8 kg 124 mL/hr over 30 Minutes Intravenous Daily 01/07/23 0859 01/08/23 0814   12/30/22 2100  vancomycin (VANCOREADY) IVPB 750 mg/150 mL  Status:  Discontinued        750 mg 150 mL/hr over 60 Minutes Intravenous Every 24 hours 12/30/22 1037 12/30/22 1427   12/30/22 2100  vancomycin (VANCOCIN) IVPB 1000 mg/200 mL premix  Status:  Discontinued        1,000 mg 200 mL/hr over 60 Minutes Intravenous Every 24 hours 12/30/22 1427 01/07/23 0859   12/29/22 2000  vancomycin (VANCOREADY) IVPB 1250 mg/250 mL        1,250 mg 166.7 mL/hr over 90 Minutes Intravenous  Once 12/29/22 1928 12/30/22 0154   12/29/22 1917  vancomycin variable dose per unstable renal function (pharmacist dosing)  Status:  Discontinued         Does not apply See admin instructions 12/29/22 1928 12/30/22 1037      Subjective: No acute issues or events overnight, no further episodes of dark tarry stool denies blood per rectum otherwise denies chest pain shortness of breath vomiting headache fevers or chills.  He does report transient episode of nausea which makes eating difficult.  Objective: Vitals:   01/09/23 1930 01/09/23 2059 01/09/23 2230 01/10/23 0320  BP: (!) 97/49 (!) 117/51  135/61  Pulse: 83   91  Resp: 16  19 18   Temp: 97.6 F (36.4 C)  98.1 F (36.7 C) 98.5 F (36.9 C)  TempSrc: Axillary  Axillary Axillary  SpO2: 99%   95%  Weight:    76.6 kg  Height:         Intake/Output Summary (Last 24 hours) at 01/10/2023 0715 Last data filed at 01/10/2023 0321 Gross per 24 hour  Intake 240 ml  Output 2700 ml  Net -2460 ml   Filed Weights   01/08/23 0600 01/09/23 0350 01/10/23 0320  Weight: 75.5 kg 77.2 kg 76.6 kg    Examination:  General: Sitting up in bed attempting to navigate breakfast. HEENT: Core track noted Lungs: Coarse breath sounds bilaterally without overt wheezes or rales. Heart: Irregularly irregular.  Without murmurs, rubs, or gallops. Abdomen:  Soft, nontender, nondistended.  Without guarding or rebound. Extremities: Without cyanosis, clubbing, edema, or obvious deformity. Skin:  Warm and dry, no erythema.  Data Reviewed: I have personally reviewed following labs and imaging studies  CBC: Recent Labs  Lab 01/04/23 1011 01/08/23 0253 01/08/23 0856 01/09/23 1157 01/10/23 0557  WBC 9.9 11.5*  --  14.1* 10.9*  NEUTROABS 8.5*  --   --   --   --   HGB 7.5* 6.9* 6.7* 8.9* 7.7*  HCT 24.1* 21.3* 21.7* 27.4* 24.0*  MCV 93.1 91.8  --  90.4 91.6  PLT 230 276  --  291 259   Basic Metabolic Panel: Recent Labs  Lab 01/04/23 1011 01/06/23 0817 01/08/23 0253 01/09/23 0359 01/09/23 1426 01/09/23 1743 01/09/23 2143 01/10/23 0131 01/10/23 0557  NA 137   < > 131*   < > 130* 131* 134* 131* 131*  K 4.2   < > 5.5*   < > 6.7* 5.3* 5.1 5.5* 5.9*  CL 103   < > 98   < > 102 98 98 101 100  CO2 19*   < > 24   < > 19* 24 23 23 24   GLUCOSE 146*   < > 121*   < > 98 106* 134* 123* 136*  BUN 31*   < > 77*   < > 94* 93* 95* 97* 98*  CREATININE 1.45*   < > 2.11*   < > 2.28* 2.24* 2.30* 2.32* 2.31*  CALCIUM 8.7*   < > 8.6*   < > 9.1 9.2 9.1 8.6* 8.8*  MG 1.9  --  2.2  --   --   --   --   --   --    < > = values in this interval not displayed.   GFR: Estimated Creatinine Clearance: 24.7 mL/min (A) (by C-G formula based on SCr of 2.31 mg/dL (H)). Liver Function Tests: Recent Labs  Lab 01/06/23 0817  AST 30  ALT 39  ALKPHOS 75   BILITOT 0.4  PROT 5.3*  ALBUMIN 2.1*   Cardiac Enzymes: Recent Labs  Lab 01/07/23 0809 01/09/23 1743 01/10/23 0557  CKTOTAL 20* 25* 47*   CBG: Recent Labs  Lab 01/09/23 1638 01/09/23 2019 01/09/23 2310 01/10/23 0334 01/10/23 0711  GLUCAP 193* 112* 117* 111* 132*    Recent Results (from the past 240 hour(s))  Culture, blood (Routine X 2) w Reflex to ID Panel     Status: Abnormal  Collection Time: 12/31/22  7:37 AM   Specimen: BLOOD RIGHT HAND  Result Value Ref Range Status   Specimen Description BLOOD RIGHT HAND  Final   Special Requests   Final    BOTTLES DRAWN AEROBIC AND ANAEROBIC Blood Culture adequate volume   Culture  Setup Time   Final    GRAM POSITIVE COCCI IN CLUSTERS ANAEROBIC BOTTLE ONLY CRITICAL RESULT CALLED TO, READ BACK BY AND VERIFIED WITH: PHARMD J. LEDFORD 01/01/23 @ 0330 BY AB Performed at [facility], [address], [place], [place] [address]    Culture METHICILLIN RESISTANT STAPHYLOCOCCUS AUREUS (A)  Final   Report Status 01/03/2023 FINAL  Final   Organism ID, Bacteria METHICILLIN RESISTANT STAPHYLOCOCCUS AUREUS  Final      Susceptibility   Methicillin resistant staphylococcus aureus - MIC*    CIPROFLOXACIN >=8 RESISTANT Resistant     ERYTHROMYCIN >=8 RESISTANT Resistant     GENTAMICIN <=0.5 SENSITIVE Sensitive     OXACILLIN >=4 RESISTANT Resistant     TETRACYCLINE <=1 SENSITIVE Sensitive     VANCOMYCIN 1 SENSITIVE Sensitive     TRIMETH/SULFA >=320 RESISTANT Resistant     CLINDAMYCIN <=0.25 SENSITIVE Sensitive     RIFAMPIN <=0.5 SENSITIVE Sensitive     Inducible Clindamycin NEGATIVE Sensitive     LINEZOLID 2 SENSITIVE Sensitive     * METHICILLIN RESISTANT STAPHYLOCOCCUS AUREUS  Blood Culture ID Panel (Reflexed)     Status: Abnormal   Collection Time: 12/31/22  7:37 AM  Result Value Ref Range Status   Enterococcus faecalis NOT DETECTED NOT DETECTED Final   Enterococcus Faecium NOT DETECTED NOT DETECTED Final   Listeria  monocytogenes NOT DETECTED NOT DETECTED Final   Staphylococcus species DETECTED (A) NOT DETECTED Final    Comment: CRITICAL RESULT CALLED TO, READ BACK BY AND VERIFIED WITH: PHARMD J. LEDFORD 01/01/23 @ 0330 BY AB    Staphylococcus aureus (BCID) DETECTED (A) NOT DETECTED Final    Comment: Methicillin (oxacillin)-resistant Staphylococcus aureus (MRSA). MRSA is predictably resistant to beta-lactam antibiotics (except ceftaroline). Preferred therapy is vancomycin unless clinically contraindicated. Patient requires contact precautions if  hospitalized. CRITICAL RESULT CALLED TO, READ BACK BY AND VERIFIED WITH: PHARMD J. LEDFORD 01/01/23 @ 0330 BY AB    Staphylococcus epidermidis NOT DETECTED NOT DETECTED Final   Staphylococcus lugdunensis NOT DETECTED NOT DETECTED Final   Streptococcus species NOT DETECTED NOT DETECTED Final   Streptococcus agalactiae NOT DETECTED NOT DETECTED Final   Streptococcus pneumoniae NOT DETECTED NOT DETECTED Final   Streptococcus pyogenes NOT DETECTED NOT DETECTED Final   A.calcoaceticus-baumannii NOT DETECTED NOT DETECTED Final   Bacteroides fragilis NOT DETECTED NOT DETECTED Final   Enterobacterales NOT DETECTED NOT DETECTED Final   Enterobacter cloacae complex NOT DETECTED NOT DETECTED Final   Escherichia coli NOT DETECTED NOT DETECTED Final   Klebsiella aerogenes NOT DETECTED NOT DETECTED Final   Klebsiella oxytoca NOT DETECTED NOT DETECTED Final   Klebsiella pneumoniae NOT DETECTED NOT DETECTED Final   Proteus species NOT DETECTED NOT DETECTED Final   Salmonella species NOT DETECTED NOT DETECTED Final   Serratia marcescens NOT DETECTED NOT DETECTED Final   Haemophilus influenzae NOT DETECTED NOT DETECTED Final   Neisseria meningitidis NOT DETECTED NOT DETECTED Final   Pseudomonas aeruginosa NOT DETECTED NOT DETECTED Final   Stenotrophomonas maltophilia NOT DETECTED NOT DETECTED Final   Candida albicans NOT DETECTED NOT DETECTED Final   Candida auris NOT  DETECTED NOT DETECTED Final   Candida glabrata NOT DETECTED NOT DETECTED Final  Candida krusei NOT DETECTED NOT DETECTED Final   Candida parapsilosis NOT DETECTED NOT DETECTED Final   Candida tropicalis NOT DETECTED NOT DETECTED Final   Cryptococcus neoformans/gattii NOT DETECTED NOT DETECTED Final   Meth resistant mecA/C and MREJ DETECTED (A) NOT DETECTED Final    Comment: CRITICAL RESULT CALLED TO, READ BACK BY AND VERIFIED WITH: PHARMD J. LEDFORD 01/01/23 @ 0330 BY AB Performed at [facility], [address], [place], [place] [address]   Culture, blood (Routine X 2) w Reflex to ID Panel     Status: None   Collection Time: 12/31/22  7:43 AM   Specimen: BLOOD RIGHT HAND  Result Value Ref Range Status   Specimen Description BLOOD RIGHT HAND  Final   Special Requests   Final    BOTTLES DRAWN AEROBIC ONLY Blood Culture adequate volume   Culture   Final    NO GROWTH 5 DAYS Performed at [facility], [address] [place], [place] [address]    Report Status 01/05/2023 FINAL  Final  Culture, blood (Routine X 2) w Reflex to ID Panel     Status: None   Collection Time: 01/02/23  3:51 PM   Specimen: BLOOD LEFT HAND  Result Value Ref Range Status   Specimen Description BLOOD LEFT HAND  Final   Special Requests   Final    BOTTLES DRAWN AEROBIC AND ANAEROBIC Blood Culture adequate volume   Culture   Final    NO GROWTH 5 DAYS Performed at [facility], [address], [place], [place] [address]    Report Status 01/07/2023 FINAL  Final  Culture, blood (Routine X 2) w Reflex to ID Panel     Status: None   Collection Time: 01/02/23  3:51 PM   Specimen: BLOOD RIGHT HAND  Result Value Ref Range Status   Specimen Description BLOOD RIGHT HAND  Final   Special Requests   Final    BOTTLES DRAWN AEROBIC ONLY Blood Culture results may not be optimal due to an inadequate volume of blood received in culture bottles   Culture   Final    NO GROWTH 5 DAYS Performed at [facility], [address], [place], [place] [address]    Report Status 01/07/2023 FINAL  Final    Radiology Studies: No results found.  Scheduled Meds:  acetaminophen  1,000 mg Oral TID   amiodarone  400 mg Oral BID   apixaban  5 mg Oral BID   atorvastatin  20 mg Oral QHS   budesonide (PULMICORT) nebulizer solution  0.25 mg Nebulization BID   diclofenac Sodium  4 g Topical QID   feeding supplement  237 mL Oral BID BM   feeding supplement (OSMOLITE 1.5 CAL)  1,000 mL Per Tube Q24H   feeding supplement (PROSource TF20)  60 mL Per Tube Daily   gabapentin  100 mg Oral QHS   Gerhardt's butt cream   Topical BID    HYDROmorphone (DILAUDID) injection  0.5 mg Intravenous Once   ipratropium-albuterol  3 mL Nebulization BID   lidocaine  2 patch Transdermal Q24H   megestrol  400 mg Oral BID   metoprolol tartrate  75 mg Oral BID   midodrine  5 mg Oral TID WC   multivitamin  1 tablet Oral QHS   pantoprazole  40 mg Oral BID   pyridostigmine  60 mg Oral Q8H   sertraline  25 mg Oral Daily   sodium zirconium cyclosilicate  10 g Oral BID   sucralfate  1 g Oral TID WC & HS   ticagrelor  90 mg Oral BID   Or   ticagrelor  90 mg Per Tube BID   Continuous Infusions:  sodium chloride Stopped (01/06/23 0517)   DAPTOmycin (CUBICIN) 600 mg in sodium chloride 0.9 % IVPB 600 mg (01/09/23 1414)    LOS: 12 days   Time spent: 55 minutes  Azucena Fallen, DO [facility]  If 7PM-7AM, please contact night-coverage [email or web address] 01/10/2023, 7:15 AM

## 2023-01-10 NOTE — Progress Notes (Signed)
Occupational Therapy Treatment Patient Details Name: Matthew Zamora MRN: [id number] DOB: Jan 31, 1941 Today's Date: 01/10/2023   History of present illness 82 yo male admitted to [facility] 7/12 with SOB with decompensated HF. 7/13 hypotension with sepsis. 7/14 Rt weakness with code stroke and transfer to [facility], s/p Lt ICA thrombectomy with residual stenosis ultimately requiring left ICA stent placement.  Extubated post procedure but very confused. PMhx: COPD, CHF, AFib, CKD, HTN   OT comments  Patient with improved participation and ability to transfer this date.  Patient stating he was able to eat a little lunch with setup.  Shoulder pain improving.  Overall needing Mod A for sit to stand and step pivot back to bed.  Patient found with BM, and needing total assist for hygiene.  OT to continue efforts in the acute setting and Patient will benefit from continued inpatient follow up therapy, <3 hours/day    Recommendations for follow up therapy are one component of a multi-disciplinary discharge planning process, led by the attending physician.  Recommendations may be updated based on patient status, additional functional criteria and insurance authorization.    Assistance Recommended at Discharge Frequent or constant Supervision/Assistance  Patient can return home with the following  Two people to help with walking and/or transfers;A lot of help with bathing/dressing/bathroom;Assistance with cooking/housework;Direct supervision/assist for financial management;Direct supervision/assist for medications management;Assist for transportation;Help with stairs or ramp for entrance   Equipment Recommendations       Recommendations for Other Services      Precautions / Restrictions Precautions Precautions: Fall Precaution Comments: very anxious, shoulder pain Restrictions Weight Bearing Restrictions: No       Mobility Bed Mobility Overal bed mobility: Needs Assistance Bed Mobility: Sit to  Supine       Sit to supine: Mod assist, Max assist     Patient Response: Anxious  Transfers Overall transfer level: Needs assistance Equipment used: Rolling walker (2 wheels) Transfers: Sit to/from Stand, Bed to chair/wheelchair/BSC Sit to Stand: Mod assist     Step pivot transfers: Mod assist           Balance Overall balance assessment: Needs assistance Sitting-balance support: Feet supported Sitting balance-Leahy Scale: Fair     Standing balance support: Bilateral upper extremity supported Standing balance-Leahy Scale: Poor                             ADL either performed or assessed with clinical judgement   ADL                               Toileting- Clothing Manipulation and Hygiene: Total assistance;Bed level              Extremity/Trunk Assessment Upper Extremity Assessment RUE Deficits / Details: Improving AROM, able to bring his hand to his forehead.  Increased brusing and swelling RUE Sensation: WNL RUE Coordination: WNL LUE Deficits / Details: Improving AROM, able to bring his hand to his forehead. Increased brusing and swelling LUE Sensation: WNL LUE Coordination: WNL   Lower Extremity Assessment Lower Extremity Assessment: Defer to PT evaluation   Cervical / Trunk Assessment Cervical / Trunk Assessment: Kyphotic    Vision       Perception     Praxis      Cognition Arousal/Alertness: Awake/alert Behavior During Therapy: Anxious  Following Commands: Follows one step commands with increased time   Awareness: Intellectual Problem Solving: Slow processing, Requires verbal cues, Requires tactile cues          Exercises      Shoulder Instructions       General Comments Again, SpO2 >92% on 3L [place] throughout session, HR 110s-120s    Pertinent Vitals/ Pain       Pain Assessment Pain Assessment: Faces Faces Pain Scale: Hurts little more Pain Location: shoulders,  knees Pain Descriptors / Indicators: Grimacing, Guarding, Moaning Pain Intervention(s): Monitored during session                                                          Frequency  Min 1X/week        Progress Toward Goals  OT Goals(current goals can now be found in the care plan section)  Progress towards OT goals: Progressing toward goals  Acute Rehab OT Goals OT Goal Formulation: With patient Time For Goal Achievement: 01/24/23 Potential to Achieve Goals: Fair  Plan Discharge plan remains appropriate    Co-evaluation                 AM-PAC OT "6 Clicks" Daily Activity     Outcome Measure   Help from another person eating meals?: A Little Help from another person taking care of personal grooming?: A Little Help from another person toileting, which includes using toliet, bedpan, or urinal?: A Lot Help from another person bathing (including washing, rinsing, drying)?: A Lot Help from another person to put on and taking off regular upper body clothing?: A Lot Help from another person to put on and taking off regular lower body clothing?: A Lot 6 Click Score: 14    End of Session Equipment Utilized During Treatment: Rolling walker (2 wheels);Oxygen  OT Visit Diagnosis: Unsteadiness on feet (R26.81);Muscle weakness (generalized) (M62.81);History of falling (Z91.81);Pain Pain - Right/Left: Right Pain - part of body: Shoulder   Activity Tolerance     Patient Left in bed;with call bell/phone within reach;with bed alarm set   Nurse Communication Mobility status        Time: 1610-9604 OT Time Calculation (min): 22 min  Charges: OT General Charges $OT Visit: 1 Visit OT Treatments $Self Care/Home Management : 8-22 mins  01/10/2023  RP, OTR/L  Acute Rehabilitation Services  Office:  [phone]   Suzanna Obey 01/10/2023, 3:08 PM

## 2023-01-11 ENCOUNTER — Inpatient Hospital Stay (HOSPITAL_COMMUNITY): Payer: Medicare Other

## 2023-01-11 LAB — GLUCOSE, CAPILLARY
Glucose-Capillary: 102 mg/dL — ABNORMAL HIGH (ref 70–99)
Glucose-Capillary: 103 mg/dL — ABNORMAL HIGH (ref 70–99)
Glucose-Capillary: 104 mg/dL — ABNORMAL HIGH (ref 70–99)
Glucose-Capillary: 123 mg/dL — ABNORMAL HIGH (ref 70–99)
Glucose-Capillary: 131 mg/dL — ABNORMAL HIGH (ref 70–99)
Glucose-Capillary: 87 mg/dL (ref 70–99)
Glucose-Capillary: 87 mg/dL (ref 70–99)

## 2023-01-11 MED ORDER — OXYMETAZOLINE HCL 0.05 % NA SOLN
1.0000 | Freq: Two times a day (BID) | NASAL | Status: AC
  Administered 2023-01-11 – 2023-01-14 (×4): 1 via NASAL
  Filled 2023-01-11: qty 30

## 2023-01-11 MED ORDER — DIAZEPAM 5 MG/ML IJ SOLN
2.5000 mg | Freq: Three times a day (TID) | INTRAMUSCULAR | Status: DC | PRN
  Administered 2023-01-11 – 2023-01-12 (×3): 2.5 mg via INTRAVENOUS
  Filled 2023-01-11 (×3): qty 2

## 2023-01-11 NOTE — Plan of Care (Signed)
Problem: Fluid Volume: Goal: Hemodynamic stability will improve Outcome: Progressing   Problem: Clinical Measurements: Goal: Diagnostic test results will improve Outcome: Progressing Goal: Signs and symptoms of infection will decrease Outcome: Progressing   Problem: Respiratory: Goal: Ability to maintain adequate ventilation will improve Outcome: Progressing   Problem: Education: Goal: Knowledge of General Education information will improve Description: Including pain rating scale, medication(s)/side effects and non-pharmacologic comfort measures Outcome: Progressing   Problem: Health Behavior/Discharge Planning: Goal: Ability to manage health-related needs will improve Outcome: Progressing   Problem: Clinical Measurements: Goal: Ability to maintain clinical measurements within normal limits will improve Outcome: Progressing Goal: Will remain free from infection Outcome: Progressing Goal: Diagnostic test results will improve Outcome: Progressing Goal: Respiratory complications will improve Outcome: Progressing Goal: Cardiovascular complication will be avoided Outcome: Progressing   Problem: Activity: Goal: Risk for activity intolerance will decrease Outcome: Progressing   Problem: Nutrition: Goal: Adequate nutrition will be maintained Outcome: Progressing   Problem: Coping: Goal: Level of anxiety will decrease Outcome: Progressing   Problem: Elimination: Goal: Will not experience complications related to bowel motility Outcome: Progressing Goal: Will not experience complications related to urinary retention Outcome: Progressing   Problem: Pain Managment: Goal: General experience of comfort will improve Outcome: Progressing   Problem: Safety: Goal: Ability to remain free from injury will improve Outcome: Progressing   Problem: Skin Integrity: Goal: Risk for impaired skin integrity will decrease Outcome: Progressing   Problem: Education: Goal:  Understanding of CV disease, CV risk reduction, and recovery process will improve Outcome: Progressing Goal: Individualized Educational Video(s) Outcome: Progressing   Problem: Activity: Goal: Ability to return to baseline activity level will improve Outcome: Progressing   Problem: Cardiovascular: Goal: Ability to achieve and maintain adequate cardiovascular perfusion will improve Outcome: Progressing Goal: Vascular access site(s) Level 0-1 will be maintained Outcome: Progressing   Problem: Health Behavior/Discharge Planning: Goal: Ability to safely manage health-related needs after discharge will improve Outcome: Progressing   Problem: Education: Goal: Knowledge of disease or condition will improve Outcome: Progressing Goal: Knowledge of secondary prevention will improve (MUST DOCUMENT ALL) Outcome: Progressing Goal: Knowledge of patient specific risk factors will improve Loraine Leriche N/A or DELETE if not current risk factor) Outcome: Progressing   Problem: Ischemic Stroke/TIA Tissue Perfusion: Goal: Complications of ischemic stroke/TIA will be minimized Outcome: Progressing   Problem: Coping: Goal: Will verbalize positive feelings about self Outcome: Progressing Goal: Will identify appropriate support needs Outcome: Progressing   Problem: Health Behavior/Discharge Planning: Goal: Ability to manage health-related needs will improve Outcome: Progressing Goal: Goals will be collaboratively established with patient/family Outcome: Progressing   Problem: Self-Care: Goal: Ability to participate in self-care as condition permits will improve Outcome: Progressing Goal: Verbalization of feelings and concerns over difficulty with self-care will improve Outcome: Progressing Goal: Ability to communicate needs accurately will improve Outcome: Progressing   Problem: Nutrition: Goal: Risk of aspiration will decrease Outcome: Progressing Goal: Dietary intake will improve Outcome:  Progressing   Problem: Education: Goal: Knowledge of disease or condition will improve Outcome: Progressing Goal: Knowledge of secondary prevention will improve (MUST DOCUMENT ALL) Outcome: Progressing Goal: Knowledge of patient specific risk factors will improve Loraine Leriche N/A or DELETE if not current risk factor) Outcome: Progressing   Problem: Ischemic Stroke/TIA Tissue Perfusion: Goal: Complications of ischemic stroke/TIA will be minimized Outcome: Progressing   Problem: Coping: Goal: Will verbalize positive feelings about self Outcome: Progressing Goal: Will identify appropriate support needs Outcome: Progressing   Problem: Health Behavior/Discharge Planning: Goal: Ability to manage health-related needs  will improve Outcome: Progressing Goal: Goals will be collaboratively established with patient/family Outcome: Progressing   Problem: Self-Care: Goal: Ability to participate in self-care as condition permits will improve Outcome: Progressing Goal: Verbalization of feelings and concerns over difficulty with self-care will improve Outcome: Progressing Goal: Ability to communicate needs accurately will improve Outcome: Progressing

## 2023-01-11 NOTE — Progress Notes (Addendum)
Palliative Medicine Inpatient Follow Up Note HPI: 82 year old man with myriad of comorobidities including refractory orthostatic hypotension, HFrEF, chronic N/V who presented w/ SOB at [facility] acteremic with MRSA as well has + for rhinovirus. (+( R sided weakness w/ L ICA thrombus transferred to [facility] for emergent thrombectomy.    Palliative care has been asked to get involved in the setting of multiple readmissions, high chronic disease burden, and declined health to further discuss goals of care.   Today's Discussion 01/11/2023  *Please note that this is a verbal dictation therefore any spelling or grammatical errors are due to the "Dragon Medical One" system interpretation.  Chart reviewed inclusive of vital signs, progress notes, laboratory results, and diagnostic images.   I met at bedside with patients RN, Delaney Meigs. Patient was having some nausea as well as a nose bleed. Delaney Meigs provided an antiemetic.  On assessment, Matthew Zamora appears overall uncomfortable. He shares he continues to have pain in his neck. Heat packs were provided. Matthew Zamora vocalizes that he does not feel good right now and is "hoping to feel better". He endorses nausea as well as increased SOB. O@ was out of nose and placed back in causing relief.   Reviewed conversation from prior day. Verified goals. Patient remains hopeful to stabilize and transition to [facility]. _____________________________________ Addendum:  I was called by patients DIL, Matthew Zamora this afternoon. She requested that I come by as Matthew Zamora's nose is noted to be bleeding quite significantly. She shares no one has come to address it.  I went to bedside and communicated with nursing staff. Afrin was ordered. Patients O2 requirements are up from 3LPM to 6 LPM. He has a wheeze and shares he feels SOB. Primary team informed by staff - CXR ordered as well as RT to come for breathing tx.  I was able to speak to charge RN, Romeo Apple who went to speak to family as they felt their  needs were not being met.  I offered support through therapeutic listening.  Questions and concerns addressed/Palliative Support Provided.   Additional Time: 30  Objective Assessment: Vital Signs Vitals:   01/11/23 0453 01/11/23 0735  BP: (!) 150/94 115/71  Pulse: (!) 118 100  Resp: 20 (!) 21  Temp: 98.4 F (36.9 C) (!) 97.5 F (36.4 C)  SpO2: 91% 96%    Intake/Output Summary (Last 24 hours) at 01/11/2023 0931 Last data filed at 01/11/2023 0900 Gross per 24 hour  Intake 1788.33 ml  Output 400 ml  Net 1388.33 ml   Last Weight  Most recent update: 01/10/2023  3:21 AM    Weight  76.6 kg (168 lb 14 oz)            Gen:  Elderly Caucasian M chronically ill appearing HEENT: Coretrack in place, dry mucous membranes CV: regular rate and  Irregular rhythm  PULM:  On 4LPM [place], breathing is even and nonlabored ABD: soft/nontender  EXT: No edema  Neuro: Alert and oriented x3  SUMMARY OF RECOMMENDATIONS   DNAR/DNI  DNAR/MOST Completed, paper copy placed onto the chart electric copy can be found in Vynca   Plan for SNF placement at [facility]   Ongoing PMT support  MDM: Moderate  ______________________________________________________________________________________ Lamarr Lulas [place] Palliative Medicine Team Team Cell Phone: [phone] Please utilize secure chat with additional questions, if there is no response within 30 minutes please call the above phone number  Palliative Medicine Team providers are available by phone from 7am to 7pm daily and can be  reached through the team cell phone.  Should this patient require assistance outside of these hours, please call the patient's attending physician.

## 2023-01-11 NOTE — Progress Notes (Signed)
PROGRESS NOTE    Matthew Zamora  ZOX:[id number] DOB: Jan 12, 1941 DOA: 12/29/2022 PCP: Jerl Mina, MD   Brief Narrative:  Patient is an 82 year old male with past medical history significant for refractory orthostatic hypotension, CHF with reduced EF, chronic nausea and vomiting.  Patient initially presented to [facility] with acute shortness of breath having found to be in heart failure exacerbation exacerbated by A-fib with RVR.  At [facility] patient was hypotensive requiring fluids placed on amiodarone drip with profoundly elevated fever placed on broad-spectrum antibiotics with cultures ultimately presenting with MRSA bacteremia.  Respiratory viral panel was also positive at this time for rhinovirus.  Acutely 7/14 patient suffered right sided acute weakness and aphasia CTA showing acute left occlusion of left ICA patient evaluated by neurology recommending transfer to [facility] for evaluation by neurointerventional radiology with left carotid stent placement.  Infectious disease team consulted given MRSA positive bacteremia, initial plan was for 6 weeks IV vancomycin(now daptomycin) and transition to doxycycline for life/ongoing prophylaxis.  Discussed transitioning to linezolid as well if more appropriate.  Complicating this is implanted device, AICD, which is unable to be properly evaluated for concurrent infection given TEE is likely not going to be tolerated by the patient.  EP following along, appreciate insight and recommendations - patient appears to be poor candidate for device extraction as well.  7/24 -patient now noted to have melena with downtrending hemoglobin concerning for occult GI bleed.  Will discuss with team given patient's current medications include Eliquis and Brilinta given recent stent placement. Spoke with NeuroIR Dr Corliss Skains - if we have to hold medications would start with holding Brilinta alone. Discussed with family the risks/benefits of holding anti-plt/anticoagulation  in this patient.  7/26 -lengthy goals of care conversation with patient, 3 sons and their wives in regards to patient's current medical status, risks of decompensation and general morbidity mortality given his multiple diagnoses.  Patient indicates he would not want a feeding tube and is currently filling out a MOST form with family and palliative care so that we have a better idea of his wishes moving forward.  7/27 Patient is now DNR per discussion/MOST form -potassium remains somewhat stable over the past 24 hours on scheduled Lokelma. Assuming labs remain stable no further complications with electrolyte abnormalities or anemia would recommend discharge to skilled nursing facility versus inpatient rehab in the next 24 to 48 hours.  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 -NeuroIR planning to continue Eliquis, Brilinta. (Discussed with Dr. Antionette Poles may need to hold Brilinta in the setting of possible GI bleed, would continue Eliquis unless absolutely necessary to discontinue) -currently continued, black stools resolved -Continue statin.  PT/OT-SNF   Acute symptomatic anemia, rule out acute GI bleed  -Downtrending hemoglobin s/p 2 unit PRBC transfused 7/24 -Hgb continues to downtrend - no further dark/black stools reported -Continue Brilinta and Eliquis for now-remains high risk for thrombotic event with new carotid stent  Hyperkalemia, stabilizing -Potassium previously markedly elevated, no clear offending medications. -Rapid response 7/25 for potassium of 6.7 despite previous doses of Lokelma.  Patient received hyperkalemia pathway with moderate improvement in potassium down to 5.1 -Potassium now stabilizing at 5.1 on scheduled Lokelma, renal function somewhat downtrending  despite diuretics.  MRSA bacteremia, potential source from cardiac device, resolving Septic shock, resolved: -Repeat cultures  have been negative so far. -ID consulted -If TEE cannot be performed, completed 6 weeks of IV daptomycin, then doxycycline for life (i.e. depending on goals of care).  Zyvox is an option as well.  -CT scan does not show any evidence of cervical/thoracic discitis.   -Patient has an ICD in place with concerns of vegetation.  Currently no plans for extraction but likely will need TEE first to evaluate the device which would be a moderate to high risk procedure - anticoagulation will also need to be held for this to move forward which is complicated by above symptomatic anemia as well as recent stent and thrombectomy and chronic A-fib.   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  No evidence of discitis seen on CT.  CK normal.  Pain control, physical therapy.  Voltaren gel   Acute on chronic CKD stage IIIb NAGMA Hyponatremia, improving Baseline creatinine around 1.5, essentially back to baseline.   Normocytic anemia, likely anemia of chronic disease Hemoglobin stable at 8.3   Thrombocytopenia Platelets had drifted down to 92 now slowly recovering    GERD -PPI   Chronic hypotension On midodrine 30 mg 3 times daily, Mestinon   Depression/anxiety -Continue home sertraline 25 mg daily    Acute metabolic encephalopathy secondary to  sepsis, improved Improved  DVT prophylaxis: Place and maintain sequential compression device Start: 12/30/22 1053 apixaban (ELIQUIS) tablet 5 mg  Code Status:   Code Status: DNR Family Communication: None present   Consultants:  ID Neurointerventional radiology EP Palliative care  Procedures:  Right carotid thrombectomy and stent placement  Antimicrobials:  Anti-infectives (From admission, onward)    Start     Dose/Rate Route Frequency Ordered Stop   01/09/23 1400  DAPTOmycin (CUBICIN) 600 mg in sodium chloride 0.9 % IVPB        8 mg/kg  75.8 kg 124 mL/hr over 30 Minutes Intravenous Every 48 hours 01/08/23 0814     01/07/23 1400  DAPTOmycin (CUBICIN) 600 mg in sodium chloride 0.9 % IVPB  Status:  Discontinued        8 mg/kg  75.8 kg 124 mL/hr over 30 Minutes Intravenous Daily 01/07/23 0859 01/08/23 0814   12/30/22 2100  vancomycin (VANCOREADY) IVPB 750 mg/150 mL  Status:  Discontinued        750 mg 150 mL/hr over 60 Minutes Intravenous Every 24 hours 12/30/22 1037 12/30/22 1427   12/30/22 2100  vancomycin (VANCOCIN) IVPB 1000 mg/200 mL premix  Status:  Discontinued        1,000 mg 200 mL/hr over 60 Minutes Intravenous Every 24 hours 12/30/22 1427 01/07/23 0859   12/29/22 2000  vancomycin (VANCOREADY) IVPB 1250 mg/250 mL        1,250 mg 166.7 mL/hr over 90 Minutes Intravenous  Once 12/29/22 1928 12/30/22 0154   12/29/22 1917  vancomycin variable dose per unstable renal function (pharmacist dosing)  Status:  Discontinued         Does not apply See admin instructions 12/29/22 1928 12/30/22 1037      Subjective:  No acute issues or events overnight, no further episodes of dark tarry stool denies blood per rectum otherwise denies chest pain shortness of breath vomiting headache fevers or chills.  He does report transient episode of nausea which makes eating difficult.  Objective: Vitals:   01/10/23 1935 01/10/23 2219 01/10/23 2320 01/11/23 0453  BP:  (!) 105/58 (!) 132/92  (!) 150/94  Pulse:  99  (!) 118  Resp:   (!) 22 20  Temp:    98.4 F (36.9 C)  TempSrc:    Oral  SpO2: 94%  95% 91%  Weight:      Height:        Intake/Output Summary (Last 24 hours) at 01/11/2023 0701 Last data filed at 01/11/2023 0600 Gross per 24 hour  Intake 1768.33 ml  Output 400 ml  Net 1368.33 ml   Filed Weights   01/08/23 0600 01/09/23 0350 01/10/23 0320  Weight: 75.5 kg 77.2 kg 76.6 kg    Examination:  General: Sitting up in bed attempting to navigate breakfast. HEENT: Core track noted Lungs: Coarse breath sounds bilaterally without overt wheezes or rales. Heart: Irregularly irregular.  Without murmurs, rubs, or gallops. Abdomen:  Soft, nontender, nondistended.  Without guarding or rebound. Extremities: Without cyanosis, clubbing, edema, or obvious deformity. Skin:  Warm and dry, no erythema.  Data Reviewed: I have personally reviewed following labs and imaging studies  CBC: Recent Labs  Lab 01/04/23 1011 01/08/23 0253 01/08/23 0856 01/09/23 1157 01/10/23 0557 01/11/23 0601  WBC 9.9 11.5*  --  14.1* 10.9* 15.9*  NEUTROABS 8.5*  --   --   --   --   --   HGB 7.5* 6.9* 6.7* 8.9* 7.7* 7.9*  HCT 24.1* 21.3* 21.7* 27.4* 24.0* 24.4*  MCV 93.1 91.8  --  90.4 91.6 93.5  PLT 230 276  --  291 259 339   Basic Metabolic Panel: Recent Labs  Lab 01/04/23 1011 01/06/23 0817 01/08/23 0253 01/09/23 0359 01/10/23 1000 01/10/23 1412 01/10/23 1751 01/10/23 2218 01/11/23 0601  NA 137   < > 131*   < > 133* 132* 132* 133* 130*  K 4.2   < > 5.5*   < > 5.7* 5.6* 5.7* 5.1 5.1  CL 103   < > 98   < > 100 98 99 99 97*  CO2 19*   < > 24   < > 21* 24 22 24 24   GLUCOSE 146*   < > 121*   < > 120* 106* 107* 121* 143*  BUN 31*   < > 77*   < > 99* 97* 99* 98* 100*  CREATININE 1.45*   < > 2.11*   < > 2.41* 2.42* 2.42* 2.45* 2.35*  CALCIUM 8.7*   < > 8.6*   < > 8.7* 8.9 8.8* 8.7* 9.1  MG 1.9  --  2.2  --   --   --   --   --   --    < > = values in this interval not displayed.    GFR: Estimated Creatinine Clearance: 24.3 mL/min (A) (by C-G formula based on SCr of 2.35 mg/dL (H)). Liver Function Tests: Recent Labs  Lab 01/06/23 0817 01/11/23 0601  AST 30 40  ALT 39 42  ALKPHOS 75 77  BILITOT 0.4 0.7  PROT 5.3* 6.1*  ALBUMIN 2.1* 2.1*   Cardiac Enzymes: Recent Labs  Lab 01/07/23 0809 01/09/23 1743 01/10/23 0557  CKTOTAL 20* 25* 47*   CBG: Recent Labs  Lab  01/10/23 1109 01/10/23 1626 01/10/23 1958 01/11/23 0002 01/11/23 0617  GLUCAP 107* 97 122* 103* 131*    Recent Results (from the past 240 hour(s))  Culture, blood (Routine X 2) w Reflex to ID Panel     Status: None   Collection Time: 01/02/23  3:51 PM   Specimen: BLOOD LEFT HAND  Result Value Ref Range Status   Specimen Description BLOOD LEFT HAND  Final   Special Requests   Final    BOTTLES DRAWN AEROBIC AND ANAEROBIC Blood Culture adequate volume   Culture   Final    NO GROWTH 5 DAYS Performed at [facility], [address] [place], [place] [address]    Report Status 01/07/2023 FINAL  Final  Culture, blood (Routine X 2) w Reflex to ID Panel     Status: None   Collection Time: 01/02/23  3:51 PM   Specimen: BLOOD RIGHT HAND  Result Value Ref Range Status   Specimen Description BLOOD RIGHT HAND  Final   Special Requests   Final    BOTTLES DRAWN AEROBIC ONLY Blood Culture results may not be optimal due to an inadequate volume of blood received in culture bottles   Culture   Final    NO GROWTH 5 DAYS Performed at [facility], [address] [place], [place] [address]    Report Status 01/07/2023 FINAL  Final    Radiology Studies: No results found.  Scheduled Meds:  acetaminophen  1,000 mg Oral TID   amiodarone  400 mg Oral BID   apixaban  5 mg Oral BID   atorvastatin  20 mg Oral QHS   budesonide (PULMICORT) nebulizer solution  0.25 mg Nebulization BID   diclofenac Sodium  4 g Topical QID   docusate sodium  100 mg Oral BID   feeding supplement  237 mL Oral  TID BM   feeding supplement (NEPRO CARB STEADY)  1,000 mL Per Tube Q24H   furosemide  20 mg Oral BID   gabapentin  100 mg Oral QHS   Gerhardt's butt cream   Topical BID   ipratropium-albuterol  3 mL Nebulization BID   lidocaine  2 patch Transdermal Q24H   megestrol  400 mg Oral BID   metoprolol tartrate  75 mg Oral BID   midodrine  5 mg Oral TID WC   multivitamin  1 tablet Oral QHS   pantoprazole  40 mg Oral BID   pyridostigmine  60 mg Oral Q8H   sertraline  25 mg Oral Daily   sodium zirconium cyclosilicate  10 g Oral BID   sucralfate  1 g Oral TID WC & HS   ticagrelor  90 mg Oral BID   Or   ticagrelor  90 mg Per Tube BID   Continuous Infusions:  sodium chloride Stopped (01/06/23 0517)   DAPTOmycin (CUBICIN) 600 mg in sodium chloride 0.9 % IVPB 600 mg (01/09/23 1414)    LOS: 13 days   Time spent: 55 minutes  Azucena Fallen, DO [facility]  If 7PM-7AM, please contact night-coverage [email or web address] 01/11/2023, 7:01 AM

## 2023-01-11 NOTE — Progress Notes (Signed)
Pharmacy Antibiotic Note - Follow-Up  Matthew Zamora is a 82 y.o. male admitted on 12/29/2022 with MRSA bacteremia.  He was initially started on vancomycin and cefepime at [facility]. Pharmacy was previously consulted to manage vancomycin but after discussing with MD, patient has been switched to daptomycin (per ID recommendations) due to poor renal function.  Cr stable, CrCl ~25 ml/min, blood cultures cleared, CK stable.  Plan: Daptomycin to 600mg  (8mg /kg) IV Q48H CK q72 hours Follow-up repeat blood cultures, renal function, & GOC  Height: 5\' 8"  (172.7 cm) Weight: 76.6 kg (168 lb 14 oz) IBW/kg (Calculated) : 68.4  Temp (24hrs), Avg:98.5 F (36.9 C), Min:98.4 F (36.9 C), Max:98.6 F (37 C)  Recent Labs  Lab 01/04/23 1011 01/06/23 0817 01/08/23 0253 01/09/23 0359 01/09/23 1157 01/09/23 1426 01/10/23 0557 01/10/23 1000 01/10/23 1412 01/10/23 1751 01/10/23 2218 01/11/23 0601  WBC 9.9  --  11.5*  --  14.1*  --  10.9*  --   --   --   --  15.9*  CREATININE 1.45*   < > 2.11*   < >  --    < > 2.31* 2.41* 2.42* 2.42* 2.45* 2.35*   < > = values in this interval not displayed.    Estimated Creatinine Clearance: 24.3 mL/min (A) (by C-G formula based on SCr of 2.35 mg/dL (H)).    Allergies  Allergen Reactions   Nitrofuran Derivatives Other (See Comments)    Transaminitis    Spironolactone Other (See Comments)    Significant transaminitis    Dapto 7/23 >> Vanc 7/13 >> 7/22 Cefepime x1 7/13  7/13 Vanc 1500mg  at 1437 (SCr 1.76) 7/14 VR = 10 mcg/mL (30 hrs from loading dose) >> 1250mg  at 2100 (SCr 2.12) 7/18 Vanc trough = 18 mcg/ml (THERAPEUTIC)  7/13 BCx - MRSA  7/14 MRSA PCR - positive 7/16 BCx - 1/4 +MRSA 7/18 Bcx ngtdF  Fredonia Highland, PharmD, BCPS, [facility] Clinical Pharmacist [phone] Please check AMION for all [facility] Pharmacy numbers 01/11/2023

## 2023-01-12 LAB — GLUCOSE, CAPILLARY
Glucose-Capillary: 119 mg/dL — ABNORMAL HIGH (ref 70–99)
Glucose-Capillary: 131 mg/dL — ABNORMAL HIGH (ref 70–99)
Glucose-Capillary: 79 mg/dL (ref 70–99)
Glucose-Capillary: 96 mg/dL (ref 70–99)
Glucose-Capillary: 99 mg/dL (ref 70–99)

## 2023-01-12 NOTE — Plan of Care (Signed)
Problem: Fluid Volume: Goal: Hemodynamic stability will improve Outcome: Progressing   Problem: Clinical Measurements: Goal: Diagnostic test results will improve Outcome: Progressing Goal: Signs and symptoms of infection will decrease Outcome: Progressing   Problem: Respiratory: Goal: Ability to maintain adequate ventilation will improve Outcome: Progressing   Problem: Education: Goal: Knowledge of General Education information will improve Description: Including pain rating scale, medication(s)/side effects and non-pharmacologic comfort measures Outcome: Progressing   Problem: Health Behavior/Discharge Planning: Goal: Ability to manage health-related needs will improve Outcome: Progressing   Problem: Clinical Measurements: Goal: Ability to maintain clinical measurements within normal limits will improve Outcome: Progressing Goal: Will remain free from infection Outcome: Progressing Goal: Diagnostic test results will improve Outcome: Progressing Goal: Respiratory complications will improve Outcome: Progressing Goal: Cardiovascular complication will be avoided Outcome: Progressing   Problem: Activity: Goal: Risk for activity intolerance will decrease Outcome: Progressing   Problem: Nutrition: Goal: Adequate nutrition will be maintained Outcome: Progressing   Problem: Coping: Goal: Level of anxiety will decrease Outcome: Progressing   Problem: Elimination: Goal: Will not experience complications related to bowel motility Outcome: Progressing Goal: Will not experience complications related to urinary retention Outcome: Progressing   Problem: Pain Managment: Goal: General experience of comfort will improve Outcome: Progressing   Problem: Safety: Goal: Ability to remain free from injury will improve Outcome: Progressing   Problem: Skin Integrity: Goal: Risk for impaired skin integrity will decrease Outcome: Progressing   Problem: Education: Goal:  Understanding of CV disease, CV risk reduction, and recovery process will improve Outcome: Progressing Goal: Individualized Educational Video(s) Outcome: Progressing   Problem: Activity: Goal: Ability to return to baseline activity level will improve Outcome: Progressing   Problem: Cardiovascular: Goal: Ability to achieve and maintain adequate cardiovascular perfusion will improve Outcome: Progressing Goal: Vascular access site(s) Level 0-1 will be maintained Outcome: Progressing   Problem: Health Behavior/Discharge Planning: Goal: Ability to safely manage health-related needs after discharge will improve Outcome: Progressing   Problem: Education: Goal: Knowledge of disease or condition will improve Outcome: Progressing Goal: Knowledge of secondary prevention will improve (MUST DOCUMENT ALL) Outcome: Progressing Goal: Knowledge of patient specific risk factors will improve Loraine Leriche N/A or DELETE if not current risk factor) Outcome: Progressing   Problem: Ischemic Stroke/TIA Tissue Perfusion: Goal: Complications of ischemic stroke/TIA will be minimized Outcome: Progressing   Problem: Coping: Goal: Will verbalize positive feelings about self Outcome: Progressing Goal: Will identify appropriate support needs Outcome: Progressing   Problem: Health Behavior/Discharge Planning: Goal: Ability to manage health-related needs will improve Outcome: Progressing Goal: Goals will be collaboratively established with patient/family Outcome: Progressing   Problem: Self-Care: Goal: Ability to participate in self-care as condition permits will improve Outcome: Progressing Goal: Verbalization of feelings and concerns over difficulty with self-care will improve Outcome: Progressing Goal: Ability to communicate needs accurately will improve Outcome: Progressing   Problem: Nutrition: Goal: Risk of aspiration will decrease Outcome: Progressing Goal: Dietary intake will improve Outcome:  Progressing   Problem: Education: Goal: Knowledge of disease or condition will improve Outcome: Progressing Goal: Knowledge of secondary prevention will improve (MUST DOCUMENT ALL) Outcome: Progressing Goal: Knowledge of patient specific risk factors will improve Loraine Leriche N/A or DELETE if not current risk factor) Outcome: Progressing   Problem: Ischemic Stroke/TIA Tissue Perfusion: Goal: Complications of ischemic stroke/TIA will be minimized Outcome: Progressing   Problem: Coping: Goal: Will verbalize positive feelings about self Outcome: Progressing Goal: Will identify appropriate support needs Outcome: Progressing   Problem: Health Behavior/Discharge Planning: Goal: Ability to manage health-related needs  will improve Outcome: Progressing Goal: Goals will be collaboratively established with patient/family Outcome: Progressing   Problem: Self-Care: Goal: Ability to participate in self-care as condition permits will improve Outcome: Progressing Goal: Verbalization of feelings and concerns over difficulty with self-care will improve Outcome: Progressing Goal: Ability to communicate needs accurately will improve Outcome: Progressing

## 2023-01-12 NOTE — Plan of Care (Signed)
Problem: Fluid Volume: Goal: Hemodynamic stability will improve 01/12/2023 0523 by Velta Addison, RN Outcome: Progressing 01/11/2023 2134 by Velta Addison, RN Outcome: Progressing   Problem: Clinical Measurements: Goal: Diagnostic test results will improve 01/12/2023 0523 by Velta Addison, RN Outcome: Progressing 01/11/2023 2134 by Velta Addison, RN Outcome: Progressing Goal: Signs and symptoms of infection will decrease 01/12/2023 0523 by Velta Addison, RN Outcome: Progressing 01/11/2023 2134 by Velta Addison, RN Outcome: Progressing   Problem: Respiratory: Goal: Ability to maintain adequate ventilation will improve 01/12/2023 0523 by Velta Addison, RN Outcome: Progressing 01/11/2023 2134 by Velta Addison, RN Outcome: Progressing   Problem: Education: Goal: Knowledge of General Education information will improve Description: Including pain rating scale, medication(s)/side effects and non-pharmacologic comfort measures 01/12/2023 0523 by Velta Addison, RN Outcome: Progressing 01/11/2023 2134 by Velta Addison, RN Outcome: Progressing   Problem: Health Behavior/Discharge Planning: Goal: Ability to manage health-related needs will improve 01/12/2023 0523 by Velta Addison, RN Outcome: Progressing 01/11/2023 2134 by Velta Addison, RN Outcome: Progressing   Problem: Clinical Measurements: Goal: Ability to maintain clinical measurements within normal limits will improve 01/12/2023 0523 by Velta Addison, RN Outcome: Progressing 01/11/2023 2134 by Velta Addison, RN Outcome: Progressing Goal: Will remain free from infection 01/12/2023 0523 by Velta Addison, RN Outcome: Progressing 01/11/2023 2134 by Velta Addison, RN Outcome: Progressing Goal: Diagnostic test results will improve 01/12/2023 0523 by Velta Addison, RN Outcome: Progressing 01/11/2023 2134 by Velta Addison, RN Outcome: Progressing Goal: Respiratory  complications will improve 01/12/2023 0523 by Velta Addison, RN Outcome: Progressing 01/11/2023 2134 by Velta Addison, RN Outcome: Progressing Goal: Cardiovascular complication will be avoided 01/12/2023 0523 by Velta Addison, RN Outcome: Progressing 01/11/2023 2134 by Velta Addison, RN Outcome: Progressing   Problem: Activity: Goal: Risk for activity intolerance will decrease 01/12/2023 0523 by Velta Addison, RN Outcome: Progressing 01/11/2023 2134 by Velta Addison, RN Outcome: Progressing   Problem: Nutrition: Goal: Adequate nutrition will be maintained 01/12/2023 0523 by Velta Addison, RN Outcome: Progressing 01/11/2023 2134 by Velta Addison, RN Outcome: Progressing   Problem: Coping: Goal: Level of anxiety will decrease 01/12/2023 0523 by Velta Addison, RN Outcome: Progressing 01/11/2023 2134 by Velta Addison, RN Outcome: Progressing   Problem: Elimination: Goal: Will not experience complications related to bowel motility 01/12/2023 0523 by Velta Addison, RN Outcome: Progressing 01/11/2023 2134 by Velta Addison, RN Outcome: Progressing Goal: Will not experience complications related to urinary retention 01/12/2023 0523 by Velta Addison, RN Outcome: Progressing 01/11/2023 2134 by Velta Addison, RN Outcome: Progressing   Problem: Pain Managment: Goal: General experience of comfort will improve 01/12/2023 0523 by Velta Addison, RN Outcome: Progressing 01/11/2023 2134 by Velta Addison, RN Outcome: Progressing   Problem: Safety: Goal: Ability to remain free from injury will improve 01/12/2023 0523 by Velta Addison, RN Outcome: Progressing 01/11/2023 2134 by Velta Addison, RN Outcome: Progressing   Problem: Skin Integrity: Goal: Risk for impaired skin integrity will decrease 01/12/2023 0523 by Velta Addison, RN Outcome: Progressing 01/11/2023 2134 by Velta Addison, RN Outcome: Progressing    Problem: Education: Goal: Understanding of CV disease, CV risk reduction, and recovery process will improve 01/12/2023 0523 by Velta Addison, RN Outcome: Progressing 01/11/2023 2134 by Velta Addison, RN Outcome: Progressing Goal: Individualized Educational Video(s) 01/12/2023 0523 by Velta Addison, RN Outcome: Progressing 01/11/2023 2134  by Velta Addison, RN Outcome: Progressing   Problem: Activity: Goal: Ability to return to baseline activity level will improve 01/12/2023 0523 by Velta Addison, RN Outcome: Progressing 01/11/2023 2134 by Velta Addison, RN Outcome: Progressing   Problem: Cardiovascular: Goal: Ability to achieve and maintain adequate cardiovascular perfusion will improve 01/12/2023 0523 by Velta Addison, RN Outcome: Progressing 01/11/2023 2134 by Velta Addison, RN Outcome: Progressing Goal: Vascular access site(s) Level 0-1 will be maintained 01/12/2023 0523 by Velta Addison, RN Outcome: Progressing 01/11/2023 2134 by Velta Addison, RN Outcome: Progressing   Problem: Health Behavior/Discharge Planning: Goal: Ability to safely manage health-related needs after discharge will improve 01/12/2023 0523 by Velta Addison, RN Outcome: Progressing 01/11/2023 2134 by Velta Addison, RN Outcome: Progressing   Problem: Education: Goal: Knowledge of disease or condition will improve 01/12/2023 0523 by Velta Addison, RN Outcome: Progressing 01/11/2023 2134 by Velta Addison, RN Outcome: Progressing Goal: Knowledge of secondary prevention will improve (MUST DOCUMENT ALL) 01/12/2023 0523 by Velta Addison, RN Outcome: Progressing 01/11/2023 2134 by Velta Addison, RN Outcome: Progressing Goal: Knowledge of patient specific risk factors will improve Loraine Leriche N/A or DELETE if not current risk factor) 01/12/2023 0523 by Velta Addison, RN Outcome: Progressing 01/11/2023 2134 by Velta Addison, RN Outcome: Progressing    Problem: Ischemic Stroke/TIA Tissue Perfusion: Goal: Complications of ischemic stroke/TIA will be minimized 01/12/2023 0523 by Velta Addison, RN Outcome: Progressing 01/11/2023 2134 by Velta Addison, RN Outcome: Progressing   Problem: Coping: Goal: Will verbalize positive feelings about self 01/12/2023 0523 by Velta Addison, RN Outcome: Progressing 01/11/2023 2134 by Velta Addison, RN Outcome: Progressing Goal: Will identify appropriate support needs 01/12/2023 0523 by Velta Addison, RN Outcome: Progressing 01/11/2023 2134 by Velta Addison, RN Outcome: Progressing   Problem: Health Behavior/Discharge Planning: Goal: Ability to manage health-related needs will improve 01/12/2023 0523 by Velta Addison, RN Outcome: Progressing 01/11/2023 2134 by Velta Addison, RN Outcome: Progressing Goal: Goals will be collaboratively established with patient/family 01/12/2023 0523 by Velta Addison, RN Outcome: Progressing 01/11/2023 2134 by Velta Addison, RN Outcome: Progressing   Problem: Self-Care: Goal: Ability to participate in self-care as condition permits will improve 01/12/2023 0523 by Velta Addison, RN Outcome: Progressing 01/11/2023 2134 by Velta Addison, RN Outcome: Progressing Goal: Verbalization of feelings and concerns over difficulty with self-care will improve 01/12/2023 0523 by Velta Addison, RN Outcome: Progressing 01/11/2023 2134 by Velta Addison, RN Outcome: Progressing Goal: Ability to communicate needs accurately will improve 01/12/2023 0523 by Velta Addison, RN Outcome: Progressing 01/11/2023 2134 by Velta Addison, RN Outcome: Progressing   Problem: Nutrition: Goal: Risk of aspiration will decrease 01/12/2023 0523 by Velta Addison, RN Outcome: Progressing 01/11/2023 2134 by Velta Addison, RN Outcome: Progressing Goal: Dietary intake will improve 01/12/2023 0523 by Velta Addison, RN Outcome:  Progressing 01/11/2023 2134 by Velta Addison, RN Outcome: Progressing   Problem: Education: Goal: Knowledge of disease or condition will improve 01/12/2023 0523 by Velta Addison, RN Outcome: Progressing 01/11/2023 2134 by Velta Addison, RN Outcome: Progressing Goal: Knowledge of secondary prevention will improve (MUST DOCUMENT ALL) 01/12/2023 0523 by Velta Addison, RN Outcome: Progressing 01/11/2023 2134 by Velta Addison, RN Outcome: Progressing Goal: Knowledge of patient specific risk factors will improve Loraine Leriche N/A or DELETE if not current risk factor) 01/12/2023 0523 by Velta Addison, RN Outcome: Progressing 01/11/2023  2134 by Velta Addison, RN Outcome: Progressing   Problem: Ischemic Stroke/TIA Tissue Perfusion: Goal: Complications of ischemic stroke/TIA will be minimized 01/12/2023 0523 by Velta Addison, RN Outcome: Progressing 01/11/2023 2134 by Velta Addison, RN Outcome: Progressing   Problem: Coping: Goal: Will verbalize positive feelings about self 01/12/2023 0523 by Velta Addison, RN Outcome: Progressing 01/11/2023 2134 by Velta Addison, RN Outcome: Progressing Goal: Will identify appropriate support needs 01/12/2023 0523 by Velta Addison, RN Outcome: Progressing 01/11/2023 2134 by Velta Addison, RN Outcome: Progressing   Problem: Health Behavior/Discharge Planning: Goal: Ability to manage health-related needs will improve 01/12/2023 0523 by Velta Addison, RN Outcome: Progressing 01/11/2023 2134 by Velta Addison, RN Outcome: Progressing Goal: Goals will be collaboratively established with patient/family 01/12/2023 0523 by Velta Addison, RN Outcome: Progressing 01/11/2023 2134 by Velta Addison, RN Outcome: Progressing   Problem: Self-Care: Goal: Ability to participate in self-care as condition permits will improve 01/12/2023 0523 by Velta Addison, RN Outcome: Progressing 01/11/2023 2134 by Velta Addison, RN Outcome: Progressing Goal: Verbalization of feelings and concerns over difficulty with self-care will improve 01/12/2023 0523 by Velta Addison, RN Outcome: Progressing 01/11/2023 2134 by Velta Addison, RN Outcome: Progressing Goal: Ability to communicate needs accurately will improve 01/12/2023 0523 by Velta Addison, RN Outcome: Progressing 01/11/2023 2134 by Velta Addison, RN Outcome: Progressing

## 2023-01-12 NOTE — Plan of Care (Signed)
  Problem: Fluid Volume: Goal: Hemodynamic stability will improve Outcome: Progressing   Problem: Education: Goal: Knowledge of General Education information will improve Description: Including pain rating scale, medication(s)/side effects and non-pharmacologic comfort measures Outcome: Progressing   Problem: Elimination: Goal: Will not experience complications related to bowel motility Outcome: Progressing Goal: Will not experience complications related to urinary retention Outcome: Progressing

## 2023-01-12 NOTE — Progress Notes (Addendum)
Daily Progress Note   Patient Name: Matthew Zamora       Date: 01/12/2023 DOB: 1940-11-03  Age: 82 y.o. MRN#: [id number] Attending Physician: Azucena Fallen, MD Primary Care Physician: Jerl Mina, MD Admit Date: 12/29/2022  Reason for Consultation/Follow-up: Establishing goals of care  Length of Stay: 14  Current Medications: Scheduled Meds:   acetaminophen  1,000 mg Oral TID   amiodarone  400 mg Oral BID   apixaban  5 mg Oral BID   atorvastatin  20 mg Oral QHS   budesonide (PULMICORT) nebulizer solution  0.25 mg Nebulization BID   diclofenac Sodium  4 g Topical QID   docusate sodium  100 mg Oral BID   feeding supplement  237 mL Oral TID BM   feeding supplement (NEPRO CARB STEADY)  1,000 mL Per Tube Q24H   furosemide  20 mg Oral BID   gabapentin  100 mg Oral QHS   Gerhardt's butt cream   Topical BID   ipratropium-albuterol  3 mL Nebulization BID   lidocaine  2 patch Transdermal Q24H   megestrol  400 mg Oral BID   metoprolol tartrate  75 mg Oral BID   midodrine  5 mg Oral TID WC   multivitamin  1 tablet Oral QHS   oxymetazoline  1 spray Each Nare BID   pantoprazole  40 mg Oral BID   pyridostigmine  60 mg Oral Q8H   sertraline  25 mg Oral Daily   sodium zirconium cyclosilicate  10 g Oral BID   sucralfate  1 g Oral TID WC & HS   ticagrelor  90 mg Oral BID   Or   ticagrelor  90 mg Per Tube BID    Continuous Infusions:  sodium chloride Stopped (01/06/23 0517)   DAPTOmycin (CUBICIN) 600 mg in sodium chloride 0.9 % IVPB 124 mL/hr at 01/12/23 0800    PRN Meds: sodium chloride, diazepam, hydrALAZINE, hydrocortisone, hydrocortisone cream, levalbuterol, loratadine, metoprolol tartrate, Muscle Rub, naloxone, ondansetron (ZOFRAN) IV, mouth rinse, polyethylene glycol,  polyvinyl alcohol, senna-docusate, sodium chloride, traMADol  Physical Exam Vitals reviewed.  Constitutional:      Appearance: He is ill-appearing.     Interventions: Nasal cannula in place.  Skin:    General: Skin is warm and dry.     Findings: Bruising present.  Neurological:  Mental Status: He is alert.             Vital Signs: BP (!) 94/56 (BP Location: Right Arm)   Pulse 83   Temp 97.9 F (36.6 C) (Oral)   Resp 18   Ht 5\' 8"  (1.727 m)   Wt 75.9 kg   SpO2 94%   BMI 25.44 kg/m  SpO2: SpO2: 94 % O2 Device: O2 Device: Nasal Cannula O2 Flow Rate: O2 Flow Rate (L/min): 4 L/min  Intake/output summary:  Intake/Output Summary (Last 24 hours) at 01/12/2023 1338 Last data filed at 01/12/2023 0800 Gross per 24 hour  Intake 244 ml  Output 700 ml  Net -456 ml   LBM: Last BM Date : 01/12/23 Baseline Weight: Weight: 67.2 kg Most recent weight: Weight: 75.9 kg   Patient Active Problem List   Diagnosis Date Noted   Occlusive thrombus 12/29/2022   MRSA bacteremia 12/29/2022   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 coli 04/29/2022   Diarrhea 04/28/2022   Multifocal pneumonia 04/23/2022   Erosive esophagitis 04/22/2022   Dizziness 04/21/2022   Intractable nausea and vomiting 04/21/2022   Dyspnea 04/20/2022   Hyponatremia 04/19/2022   Orthostatic hypotension 04/18/2022   Postural dizziness with near syncope 04/18/2022   Near syncope 04/17/2022   COPD (chronic obstructive pulmonary disease) ([name]) 04/17/2022   Hyperkalemia  04/17/2022   Abnormal LFTs 04/17/2022   Chronic systolic CHF (congestive heart failure) ([name]) 04/17/2022   Normocytic anemia 04/17/2022   GERD without esophagitis 02/12/2022   Recurrent falls    Generalized weakness    NSVT (nonsustained ventricular tachycardia) ([name]) 02/11/2022   Acute on chronic combined systolic (congestive) and diastolic (congestive) heart failure ([name]) 08/10/2021   Acute respiratory failure with hypoxia ([name])    HCAP (healthcare-associated pneumonia) 06/13/2021   Acute on chronic systolic CHF (congestive heart failure) ([name]) 06/13/2021   Elevated troponin 06/13/2021   CKD (chronic kidney disease), 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   Lung nodule 06/13/2021   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   COPD exacerbation ([name]) 06/13/2021   Volume depletion 12/15/2019   Hypotension 12/15/2019   Cardiorenal syndrome    Atrial fibrillation with RVR ([name])    Defibrillator discharge    Tachycardia 10/25/2019   CHF (congestive heart failure) ([name]) 09/18/2019   Acute on chronic combined systolic and diastolic CHF (congestive heart failure) ([name]) 09/17/2019   CAD (coronary artery disease) 09/17/2019   Ischemic cardiomyopathy 09/17/2019   CKD (chronic kidney disease) stage 3, GFR 30-59 ml/min ([name]) 09/17/2019   Acute hypoxemic respiratory failure ([name]) 09/17/2018   AAA (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 ([name]) 07/06/2018   Essential hypertension 07/06/2018   Hyperlipidemia 07/06/2018   Paroxysmal atrial fibrillation ([name])    Acute on chronic respiratory failure with hypoxia ([name]) 04/26/2018    Palliative Care Assessment & Plan   Patient Profile: 82 year old man with myriad of comorobidities including refractory orthostatic hypotension, HFrEF, chronic N/V who presented w/ SOB at [facility] acteremic with MRSA as well has + for rhinovirus. (+( R sided weakness w/ L ICA thrombus transferred to [facility] for emergent thrombectomy.    Palliative care  has been asked to get involved in the setting of  multiple readmissions, high chronic disease burden, and declined health to further discuss goals of care.   Assessment: Patient states he feels better today than yesterday. He is on 6L [place] and using a fan on his face. His family is at bedside. RN states he was unable to tolerate bipap last night.  Answered daughter-in-law [name]'s questions about last night and the plan. We discussed his anemia which appears stable. We also discussed the seriousness of his condition with his MRSA bacteremia. We discussed the need for lifelong prophylaxis and his implanted AICD. She is hopeful he will get stronger at SNF.   I encouraged her to call PMT with any questions or concerns.  Recommendations/Plan: DNR MOST form in chart Plan for SNF placement Ongoing PMT support   Code Status:    Code Status Orders  (From admission, onward)           Start     Ordered   01/10/23 1614  Do not attempt resuscitation (DNR)  Continuous       Question Answer Comment  If patient has no pulse and is not breathing Do Not Attempt Resuscitation   If patient has a pulse and/or is breathing: Medical Treatment Goals LIMITED ADDITIONAL INTERVENTIONS: Use medication/IV fluids and cardiac monitoring as indicated; Do not use intubation or mechanical ventilation (DNI), also provide comfort medications.  Transfer to Progressive/Stepdown as indicated, avoid Intensive Care.   Consent: Discussion documented in EHR or advanced directives reviewed      01/10/23 1613          Care plan was discussed with bedside RN  Detailed review of medical records (labs, imaging, vital signs), medically appropriate exam, discussed with treatment team, counseling and education to patient and family, documenting clinical information, and coordination of care.   Time spent: 35 minutes  Thank you for allowing the Palliative Medicine Team to assist in the care of this patient.   Sherryll Burger, NP  Please contact Palliative Medicine Team  phone at [phone] for questions and concerns.

## 2023-01-12 NOTE — Progress Notes (Signed)
PROGRESS NOTE    Matthew Zamora  GEX:[id number] DOB: 1940-10-16 DOA: 12/29/2022 PCP: Jerl Mina, MD   Brief Narrative:  Patient is an 82 year old male with past medical history significant for refractory orthostatic hypotension, CHF with reduced EF, chronic nausea and vomiting.  Patient initially presented to [facility] with acute shortness of breath having found to be in heart failure exacerbation exacerbated by A-fib with RVR.  At [facility] patient was hypotensive requiring fluids placed on amiodarone drip with profoundly elevated fever placed on broad-spectrum antibiotics with cultures ultimately presenting with MRSA bacteremia.  Respiratory viral panel was also positive at this time for rhinovirus.  Acutely 7/14 patient suffered right sided acute weakness and aphasia CTA showing acute left occlusion of left ICA patient evaluated by neurology recommending transfer to [facility] for evaluation by neurointerventional radiology with left carotid stent placement.  Infectious disease team consulted given MRSA positive bacteremia, initial plan was for 6 weeks IV vancomycin(now daptomycin) and transition to doxycycline for life/ongoing prophylaxis.  Discussed transitioning to linezolid as well if more appropriate.  Complicating this is implanted device, AICD, which is unable to be properly evaluated for concurrent infection given TEE is likely not going to be tolerated by the patient.  EP following along, appreciate insight and recommendations - patient appears to be poor candidate for device extraction as well.  7/24 -patient now noted to have melena with downtrending hemoglobin concerning for occult GI bleed.  Will discuss with team given patient's current medications include Eliquis and Brilinta given recent stent placement. Spoke with NeuroIR Dr Corliss Skains - if we have to hold medications would start with holding Brilinta alone. Discussed with family the risks/benefits of holding  anti-plt/anticoagulation in this patient.  7/26 -lengthy goals of care conversation with patient, 3 sons and their wives in regards to patient's current medical status, risks of decompensation and general morbidity mortality given his multiple diagnoses.  Patient indicates he would not want a feeding tube and is currently filling out a MOST form with family and palliative care so that we have a better idea of his wishes moving forward.  7/27-28 Patient is now DNR per discussion/MOST form -potassium remains somewhat stable over the past 24 hours on scheduled Lokelma. Assuming labs remain stable no further complications with electrolyte abnormalities or anemia would recommend discharge to skilled nursing facility versus inpatient rehab in the next 24 to 48 hours.  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 -NeuroIR planning to continue Eliquis, Brilinta. Black stools resolved -Continue statin.  PT/OT-SNF   Acute symptomatic anemia, rule out acute GI bleed  -Downtrending hemoglobin s/p 2 unit PRBC transfused 7/24 -Hgb continues to downtrend - no further dark/black stools reported -Likely anemia of chronic disease and malnutrition given complex hospital course -Continue Brilinta and Eliquis for now-remains high risk for thrombotic event with new carotid stent  Hyperkalemia, stabilizing -Potassium previously markedly elevated, no clear offending medications. -Rapid response 7/25 for potassium of 6.7 despite previous doses of Lokelma.  Patient received hyperkalemia pathway with moderate improvement in potassium down to 5.1 -Potassium now stabilizing at 5.1 on scheduled Lokelma, renal function somewhat downtrending despite diuretics -continue to monitor  MRSA bacteremia, potential  source from cardiac device, resolving Septic shock, resolved: -Repeat cultures have been negative so far. -ID consulted -If TEE cannot be performed,  completed 6 weeks of IV daptomycin, then doxycycline for life (i.e. depending on goals of care).  Zyvox is an option as well.  -CT scan does not show any evidence of cervical/thoracic discitis.   -Patient has an ICD in place with concerns of vegetation.  Currently no plans for extraction but likely will need TEE first to evaluate the device which would be a moderate to high risk procedure - anticoagulation will also need to be held for this to move forward which is complicated by above symptomatic anemia as well as recent stent and thrombectomy and chronic A-fib.   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  No evidence of discitis seen on CT.  CK normal.  Pain control, physical therapy.  Voltaren gel   Acute on chronic CKD stage IIIb NAGMA Hyponatremia, improving Baseline creatinine around 1.5, essentially back to baseline.   Normocytic anemia, likely anemia of chronic disease Hemoglobin stable at 8.3   Thrombocytopenia Platelets had drifted down to 92 now slowly recovering    GERD -PPI   Chronic hypotension On midodrine 30 mg 3 times daily, Mestinon   Depression/anxiety -Continue home sertraline 25 mg daily    Acute metabolic encephalopathy secondary to sepsis, improved Improved  DVT prophylaxis:  Place and maintain sequential compression device Start: 12/30/22 1053 apixaban (ELIQUIS) tablet 5 mg  Code Status:   Code Status: DNR Family Communication: None present   Consultants:  ID Neurointerventional radiology EP Palliative care  Procedures:  Right carotid thrombectomy and stent placement  Antimicrobials:  Anti-infectives (From admission, onward)    Start     Dose/Rate Route Frequency Ordered Stop   01/09/23 1400  DAPTOmycin (CUBICIN) 600 mg in sodium chloride 0.9 % IVPB        8 mg/kg  75.8 kg 124 mL/hr over 30 Minutes Intravenous Every 48 hours 01/08/23 0814     01/07/23 1400  DAPTOmycin (CUBICIN) 600 mg in sodium chloride 0.9 % IVPB  Status:  Discontinued        8 mg/kg  75.8 kg 124 mL/hr over 30 Minutes Intravenous Daily 01/07/23 0859 01/08/23 0814   12/30/22 2100  vancomycin (VANCOREADY) IVPB 750 mg/150 mL  Status:  Discontinued        750 mg 150 mL/hr over 60 Minutes Intravenous Every 24 hours 12/30/22 1037 12/30/22 1427   12/30/22 2100  vancomycin (VANCOCIN) IVPB 1000 mg/200 mL premix  Status:  Discontinued        1,000 mg 200 mL/hr over 60 Minutes Intravenous Every 24 hours 12/30/22 1427 01/07/23 0859   12/29/22 2000  vancomycin (VANCOREADY) IVPB 1250 mg/250 mL        1,250 mg 166.7 mL/hr over 90 Minutes Intravenous  Once 12/29/22 1928 12/30/22 0154   12/29/22 1917  vancomycin variable dose per unstable renal function (pharmacist dosing)  Status:  Discontinued         Does not apply See admin instructions 12/29/22 1928 12/30/22 1037      Subjective: No acute issues or events overnight, no further episodes of dark tarry  stool denies blood per rectum otherwise denies chest pain shortness of breath vomiting headache fevers or chills.  He does report transient episode of nausea which makes eating difficult.  Objective: Vitals:   01/11/23 2336 01/12/23 0328 01/12/23 0400 01/12/23 0721  BP: (!) 111/59 102/64  120/73  Pulse: 87 (!) 209 (!) 102 100  Resp: 13 19   13   Temp: 98.3 F (36.8 C) 97.8 F (36.6 C)  97.9 F (36.6 C)  TempSrc: Oral Oral  Oral  SpO2: 100% 100%  99%  Weight:  75.9 kg    Height:        Intake/Output Summary (Last 24 hours) at 01/12/2023 0751 Last data filed at 01/12/2023 0414 Gross per 24 hour  Intake 264 ml  Output 700 ml  Net -436 ml   Filed Weights   01/09/23 0350 01/10/23 0320 01/12/23 0328  Weight: 77.2 kg 76.6 kg 75.9 kg    Examination:  General: Sitting up in bed attempting to navigate breakfast. HEENT: Core track noted Lungs: Coarse breath sounds bilaterally without overt wheezes or rales. Heart: Irregularly irregular.  Without murmurs, rubs, or gallops. Abdomen:  Soft, nontender, nondistended.  Without guarding or rebound. Extremities: Without cyanosis, clubbing, edema, or obvious deformity. Skin:  Warm and dry, no erythema.  Data Reviewed: I have personally reviewed following labs and imaging studies  CBC: Recent Labs  Lab 01/08/23 0253 01/08/23 0856 01/09/23 1157 01/10/23 0557 01/11/23 0601  WBC 11.5*  --  14.1* 10.9* 15.9*  HGB 6.9* 6.7* 8.9* 7.7* 7.9*  HCT 21.3* 21.7* 27.4* 24.0* 24.4*  MCV 91.8  --  90.4 91.6 93.5  PLT 276  --  291 259 339   Basic Metabolic Panel: Recent Labs  Lab 01/08/23 0253 01/09/23 0359 01/10/23 1000 01/10/23 1412 01/10/23 1751 01/10/23 2218 01/11/23 0601  NA 131*   < > 133* 132* 132* 133* 130*  K 5.5*   < > 5.7* 5.6* 5.7* 5.1 5.1  CL 98   < > 100 98 99 99 97*  CO2 24   < > 21* 24 22 24 24   GLUCOSE 121*   < > 120* 106* 107* 121* 143*  BUN 77*   < > 99* 97* 99* 98* 100*  CREATININE 2.11*   < > 2.41* 2.42* 2.42* 2.45* 2.35*  CALCIUM 8.6*   < > 8.7* 8.9 8.8* 8.7* 9.1  MG 2.2  --   --   --   --   --   --    < > = values in this interval not displayed.   GFR: Estimated Creatinine Clearance: 24.3 mL/min (A) (by C-G formula based on SCr of 2.35 mg/dL (H)). Liver Function Tests: Recent Labs  Lab 01/06/23 0817 01/11/23 0601  AST 30 40  ALT 39 42   ALKPHOS 75 77  BILITOT 0.4 0.7  PROT 5.3* 6.1*  ALBUMIN 2.1* 2.1*   Cardiac Enzymes: Recent Labs  Lab 01/07/23 0809 01/09/23 1743 01/10/23 0557  CKTOTAL 20* 25* 47*   CBG: Recent Labs  Lab 01/11/23 1136 01/11/23 1603 01/11/23 1959 01/11/23 2335 01/12/23 0327  GLUCAP 104* 102* 87 87 119*    Recent Results (from the past 240 hour(s))  Culture, blood (Routine X 2) w Reflex to ID Panel     Status: None   Collection Time: 01/02/23  3:51 PM   Specimen: BLOOD LEFT HAND  Result Value Ref Range Status   Specimen Description BLOOD LEFT HAND  Final   Special Requests   Final  BOTTLES DRAWN AEROBIC AND ANAEROBIC Blood Culture adequate volume   Culture   Final    NO GROWTH 5 DAYS Performed at [facility], [address] [place], [place] [address]    Report Status 01/07/2023 FINAL  Final  Culture, blood (Routine X 2) w Reflex to ID Panel     Status: None   Collection Time: 01/02/23  3:51 PM   Specimen: BLOOD RIGHT HAND  Result Value Ref Range Status   Specimen Description BLOOD RIGHT HAND  Final   Special Requests   Final    BOTTLES DRAWN AEROBIC ONLY Blood Culture results may not be optimal due to an inadequate volume of blood received in culture bottles   Culture   Final    NO GROWTH 5 DAYS Performed at [facility], [address] [place], [place] [address]    Report Status 01/07/2023 FINAL  Final    Radiology Studies: DG CHEST PORT 1 VIEW  Result Date: 01/11/2023 CLINICAL DATA:  Hypoxia, aspiration pneumonia, epistaxis EXAM: PORTABLE CHEST 1 VIEW COMPARISON:  01/04/2023 FINDINGS: 2 frontal views of the chest demonstrates stable AICD. Enteric catheter passes below diaphragm tip excluded by collimation. Cardiac silhouette is enlarged but stable. Continued ectasia of the thoracic aorta. Interval worsening of interstitial and perihilar airspace opacities bilaterally, with small bilateral pleural effusions suspected. No pneumothorax. No acute bony  abnormalities. IMPRESSION: 1. Worsening interstitial and airspace opacities bilaterally, which could reflect worsening infection or edema. 2. Small bilateral pleural effusions, new since prior study. Electronically Signed   By: Sharlet Salina M.D.   On: 01/11/2023 16:39    Scheduled Meds:  acetaminophen  1,000 mg Oral TID   amiodarone  400 mg Oral BID   apixaban  5 mg Oral BID   atorvastatin  20 mg Oral QHS   budesonide (PULMICORT) nebulizer solution  0.25 mg Nebulization BID   diclofenac Sodium  4 g Topical QID   docusate sodium  100 mg Oral BID   feeding supplement  237 mL Oral TID BM   feeding supplement (NEPRO CARB STEADY)  1,000 mL Per Tube Q24H   furosemide  20 mg Oral BID   gabapentin  100 mg Oral QHS   Gerhardt's butt cream   Topical BID   ipratropium-albuterol  3 mL Nebulization BID   lidocaine  2 patch Transdermal Q24H   megestrol  400 mg Oral BID   metoprolol tartrate  75 mg Oral BID   midodrine  5 mg Oral TID WC   multivitamin  1 tablet Oral QHS   oxymetazoline  1 spray Each Nare BID   pantoprazole  40 mg Oral BID   pyridostigmine  60 mg Oral Q8H   sertraline  25 mg Oral Daily   sodium zirconium cyclosilicate  10 g Oral BID   sucralfate  1 g Oral TID WC & HS   ticagrelor  90 mg Oral BID   Or   ticagrelor  90 mg Per Tube BID   Continuous Infusions:  sodium chloride Stopped (01/06/23 0517)   DAPTOmycin (CUBICIN) 600 mg in sodium chloride 0.9 % IVPB 600 mg (01/11/23 1353)    LOS: 14 days   Time spent: 55 minutes  Azucena Fallen, DO [facility]  If 7PM-7AM, please contact night-coverage [email or web address] 01/12/2023, 7:51 AM

## 2023-01-13 DIAGNOSIS — R627 Adult failure to thrive: Secondary | ICD-10-CM

## 2023-01-13 DIAGNOSIS — J9621 Acute and chronic respiratory failure with hypoxia: Secondary | ICD-10-CM

## 2023-01-13 LAB — GLUCOSE, CAPILLARY
Glucose-Capillary: 106 mg/dL — ABNORMAL HIGH (ref 70–99)
Glucose-Capillary: 116 mg/dL — ABNORMAL HIGH (ref 70–99)
Glucose-Capillary: 122 mg/dL — ABNORMAL HIGH (ref 70–99)
Glucose-Capillary: 137 mg/dL — ABNORMAL HIGH (ref 70–99)
Glucose-Capillary: 137 mg/dL — ABNORMAL HIGH (ref 70–99)
Glucose-Capillary: 94 mg/dL (ref 70–99)

## 2023-01-13 LAB — TYPE AND SCREEN
ABO/RH(D): AB NEG
Antibody Screen: NEGATIVE
Unit division: 0
Unit division: 0

## 2023-01-13 LAB — BPAM RBC
Blood Product Expiration Date: 202408192359
Blood Product Expiration Date: 202408192359
Unit Type and Rh: 600
Unit Type and Rh: 600

## 2023-01-13 LAB — PREPARE RBC (CROSSMATCH)

## 2023-01-13 MED ORDER — SODIUM CHLORIDE 0.9% IV SOLUTION: Freq: Once | INTRAVENOUS | Status: DC

## 2023-01-13 MED ORDER — LORAZEPAM 2 MG/ML IJ SOLN
0.5000 mg | INTRAMUSCULAR | Status: DC | PRN
  Administered 2023-01-13: 0.5 mg via INTRAVENOUS
  Filled 2023-01-13: qty 1

## 2023-01-13 MED ORDER — CARMEX CLASSIC LIP BALM EX OINT
TOPICAL_OINTMENT | CUTANEOUS | Status: DC | PRN
  Filled 2023-01-13: qty 10

## 2023-01-13 MED ORDER — LORAZEPAM 2 MG/ML IJ SOLN: 1.0000 mg | INTRAMUSCULAR | Status: DC | PRN

## 2023-01-13 MED ORDER — MORPHINE SULFATE (PF) 2 MG/ML IV SOLN
2.0000 mg | INTRAVENOUS | Status: DC | PRN
  Administered 2023-01-13 – 2023-01-14 (×2): 2 mg via INTRAVENOUS
  Filled 2023-01-13 (×2): qty 1

## 2023-01-13 MED ORDER — AMIODARONE HCL 200 MG PO TABS
200.0000 mg | ORAL_TABLET | Freq: Every day | ORAL | Status: DC
  Administered 2023-01-14 – 2023-01-15 (×2): 200 mg via ORAL
  Filled 2023-01-13 (×2): qty 1

## 2023-01-13 NOTE — Progress Notes (Signed)
Patients' O2 sats were in the 70s this morning, applied his nasal canula and 02 still sat in low 80s, Contacted provider, had respiratory come and apply patients' C-pap. Patient could only stand having the C-pap on for 30 min before stating it needed to be removed. Educated patient that it needed to stay on but removed per request of patient, put a non rebreather on to keep sats up.

## 2023-01-13 NOTE — Progress Notes (Signed)
RT called to bedside due to pt desat. When RT arrived at bedside, Pt was on 7L [place], SPO2 86%, RR 32. RT placed pt on HFNC 12l  and spo2 increased 88%. Pt stated he felt like he could not breathe due to being congested. When pt blew nose a moderate amount of blood clots came out. After pts nose was cleared pt stated he could breath better. However spo2 remained in 80s. RT placed pt on CPAP with 10L O2 bleed in, pt tolerating well at this time SPO2 increased to 93%. RN aware, Vitals stable,RT will monitor as needed.   01/13/23 8295  Therapy Vitals  Pulse Rate (!) 101  Resp (!) 30  MEWS Score/Color  MEWS Score 3  MEWS Score Color Yellow  Respiratory Assessment  Assessment Type Assess only  Respiratory Pattern Tachypnea;Unlabored;Regular  Chest Assessment Chest expansion symmetrical  Cough Non-productive  Sputum Amount Moderate  Sputum Color Other (Comment) (blood clots)  Sputum Consistency Mucous plugs (clotts)  Bilateral Breath Sounds Diminished  Oxygen Therapy/Pulse Ox  O2 Device (S)  CPAP  O2 Therapy Oxygen  O2 Flow Rate (L/min) (S)  10 L/min  SpO2 93 %

## 2023-01-13 NOTE — Progress Notes (Signed)
PROGRESS NOTE    Matthew Zamora  EXB:[id number] DOB: 03/12/1941 DOA: 12/29/2022 PCP: Jerl Mina, MD   Brief Narrative:  Patient is an 82 year old male with past medical history significant for refractory orthostatic hypotension, CHF with reduced EF, chronic nausea and vomiting.  Patient initially presented to [facility] with acute shortness of breath having found to be in heart failure exacerbation exacerbated by A-fib with RVR.  At [facility] patient was hypotensive requiring fluids placed on amiodarone drip with profoundly elevated fever placed on broad-spectrum antibiotics with cultures ultimately presenting with MRSA bacteremia.  Respiratory viral panel was also positive at this time for rhinovirus.  Acutely 7/14 patient suffered right sided acute weakness and aphasia CTA showing acute left occlusion of left ICA patient evaluated by neurology recommending transfer to [facility] for evaluation by neurointerventional radiology with left carotid stent placement.  Infectious disease team consulted given MRSA positive bacteremia, initial plan was for 6 weeks IV vancomycin(now daptomycin) and transition to doxycycline for life/ongoing prophylaxis.  Discussed transitioning to linezolid as well if more appropriate.  Complicating this is implanted device, AICD, which is unable to be properly evaluated for concurrent infection given TEE is likely not going to be tolerated by the patient.  EP following along, appreciate insight and recommendations - patient appears to be poor candidate for device extraction as well.  7/24 -patient now noted to have melena with downtrending hemoglobin concerning for occult GI bleed.  Will discuss with team given patient's current medications include Eliquis and Brilinta given recent stent placement. Spoke with NeuroIR Dr Corliss Skains - if we have to hold medications would start with holding Brilinta alone. Discussed with family the risks/benefits of holding  anti-plt/anticoagulation in this patient.  7/26 -lengthy goals of care conversation with patient, 3 sons and their wives in regards to patient's current medical status, risks of decompensation and general morbidity mortality given his multiple diagnoses.  Patient indicates he would not want a feeding tube and is currently filling out a MOST form with family and palliative care so that we have a better idea of his wishes moving forward.  7/27-28 Patient is now DNR per discussion/MOST form -potassium remains somewhat stable over the past 24 hours on scheduled Lokelma.  7/29 Patient continues to decline, creatinine worsened overnight, remains volume overloaded with poor ability to diurese, he has worsening anemia likely in the setting of GI bleed while on Eliquis and Brilinta.  We have discussed the risk factors of discontinuing anticoagulation and antiplatelets versus continuing them.  Patient's respiratory status this morning continues to worsen likely in setting of volume overload.  Patient is able to heroic measures, would not want dialysis, unclear if he would be a candidate regardless, at this time he is BiPAP dependent for respiratory support.  He has made his wishes well-known to myself palliative care team and nursing staff as well as daughter-in-law that he is "done with all this" -family en route to the hospital, once family arrives BiPAP will be discontinued patient will be transitioned to comfort measures with morphine and Ativan per protocol as needed for comfort.  At this time suspect patient will have in-hospital demise in the setting of multiple comorbid conditions and worsening status over the past 72 hours.   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  Once family arrives patient be transitioned off BiPAP and to start Ativan and morphine per comfort measures  Left ICA thrombus, s/p thrombectomy and left ICA stent placement: -NeuroIR planning to continue Eliquis, Brilinta.   Acute symptomatic anemia, rule out acute GI bleed  -Downtrending hemoglobin s/p 2 unit PRBC transfused 7/24 -Hgb continues to downtrend - although no further dark/black stools reported -Transitioning to comfort measures this afternoon when family arrives  Hyperkalemia, stabilizing -Potassium previously markedly elevated, no clear offending medications. -Transitioning to comfort measures this afternoon when family arrives  MRSA bacteremia, potential source from cardiac device, resolving Septic shock, resolved: -Transitioning to comfort measures this afternoon when family arrives no further lab draws - will DC antibiotics -Previous plan: If TEE cannot be performed, completed 6 weeks of IV daptomycin, then doxycycline for life (i.e. depending on goals of care).  Zyvox is an option as well.  -Patient has an ICD in place with concerns of vegetation.  Currently no plans for extraction but likely will need TEE first to evaluate the device which would be a moderate to high risk procedure - anticoagulation will also need to be held for this to move forward which is complicated by above symptomatic anemia as well as recent stent and thrombectomy and chronic A-fib.   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   No evidence of discitis seen on CT.  CK normal.  Pain control, physical therapy.  Voltaren gel   Acute on chronic CKD stage IIIb NAGMA Hyponatremia, improving Baseline creatinine around 1.5, essentially back to baseline.   Thrombocytopenia, resolved Platelets had drifted down to 92 at one point    GERD -PPI   Chronic hypotension On midodrine 30 mg 3 times daily, Mestinon   Depression/anxiety -Continue home sertraline 25 mg daily    Acute metabolic encephalopathy secondary to sepsis, improved Improved, Aox4   DVT prophylaxis: Place and maintain sequential compression device Start: 12/30/22 1053 apixaban (ELIQUIS) tablet 5 mg  Code Status:   Code Status: DNR Family Communication: At bedside, multiple members during comfort measures transition   Consultants:  ID Neurointerventional radiology EP Palliative care  Procedures:  Right carotid thrombectomy and stent placement  Antimicrobials:  Anti-infectives (From admission, onward)    Start     Dose/Rate Route Frequency Ordered Stop   01/09/23 1400  DAPTOmycin (CUBICIN) 600 mg in sodium chloride 0.9 % IVPB        8 mg/kg  75.8 kg 124 mL/hr over 30 Minutes Intravenous Every 48 hours 01/08/23 0814     01/07/23 1400  DAPTOmycin (CUBICIN) 600 mg in sodium chloride 0.9 % IVPB  Status:  Discontinued        8 mg/kg  75.8 kg 124 mL/hr over 30 Minutes Intravenous Daily 01/07/23 0859 01/08/23 0814   12/30/22 2100  vancomycin (VANCOREADY) IVPB 750 mg/150 mL  Status:  Discontinued        750 mg 150 mL/hr over  60 Minutes Intravenous Every 24 hours 12/30/22 1037 12/30/22 1427   12/30/22 2100  vancomycin (VANCOCIN) IVPB 1000 mg/200 mL premix  Status:  Discontinued        1,000 mg 200 mL/hr over 60 Minutes  Intravenous Every 24 hours 12/30/22 1427 01/07/23 0859   12/29/22 2000  vancomycin (VANCOREADY) IVPB 1250 mg/250 mL        1,250 mg 166.7 mL/hr over 90 Minutes Intravenous  Once 12/29/22 1928 12/30/22 0154   12/29/22 1917  vancomycin variable dose per unstable renal function (pharmacist dosing)  Status:  Discontinued         Does not apply See admin instructions 12/29/22 1928 12/30/22 1037      Subjective: Worsening clinical status overnight, patient reports feeling "terrible" without specific complaints other than shortness of breath and fatigue.  Reports that he is "done with all this" as we discussed palliative care as above.  Objective: Vitals:   01/12/23 2303 01/12/23 2325 01/12/23 2334 01/13/23 0408  BP:   111/70 119/79  Pulse:      Resp:      Temp:   97.6 F (36.4 C) 97.6 F (36.4 C)  TempSrc:   Axillary Axillary  SpO2: 96% 97% 94%   Weight:    73.9 kg  Height:        Intake/Output Summary (Last 24 hours) at 01/13/2023 0652 Last data filed at 01/13/2023 0409 Gross per 24 hour  Intake 240 ml  Output 1050 ml  Net -810 ml   Filed Weights   01/10/23 0320 01/12/23 0328 01/13/23 0408  Weight: 76.6 kg 75.9 kg 73.9 kg    Examination:  General: Sitting up in bed tolerating BiPAP HEENT: Core track noted Lungs: Coarse breath sounds bilaterally without overt wheezes or rales. Heart: Irregularly irregular.  Without murmurs, rubs, or gallops. Abdomen:  Soft, nontender, nondistended.  Without guarding or rebound. Extremities: Without cyanosis, clubbing, edema, or obvious deformity. Skin:  Warm and dry, no erythema.  Data Reviewed: I have personally reviewed following labs and imaging studies  CBC: Recent Labs  Lab 01/08/23 0253 01/08/23 0856 01/09/23 1157 01/10/23 0557 01/11/23 0601 01/13/23 0619  WBC 11.5*  --  14.1* 10.9* 15.9* 14.8*  HGB 6.9* 6.7* 8.9* 7.7* 7.9* 6.7*  HCT 21.3* 21.7* 27.4* 24.0* 24.4* 21.3*  MCV 91.8  --  90.4 91.6 93.5 96.8  PLT 276  --  291  259 339 313   Basic Metabolic Panel: Recent Labs  Lab 01/08/23 0253 01/09/23 0359 01/10/23 1412 01/10/23 1751 01/10/23 2218 01/11/23 0601 01/13/23 0619  NA 131*   < > 132* 132* 133* 130* 134*  K 5.5*   < > 5.6* 5.7* 5.1 5.1 5.1  CL 98   < > 98 99 99 97* 100  CO2 24   < > 24 22 24 24 22   GLUCOSE 121*   < > 106* 107* 121* 143* 103*  BUN 77*   < > 97* 99* 98* 100* 108*  CREATININE 2.11*   < > 2.42* 2.42* 2.45* 2.35* 2.69*  CALCIUM 8.6*   < > 8.9 8.8* 8.7* 9.1 9.1  MG 2.2  --   --   --   --   --   --    < > = values in this interval not displayed.   GFR: Estimated Creatinine Clearance: 21.2 mL/min (A) (by C-G formula based on SCr of 2.69 mg/dL (H)). Liver Function Tests: Recent Labs  Lab 01/06/23 0817 01/11/23 0601 01/13/23 0619  AST  30 40 39  ALT 39 42 39  ALKPHOS 75 77 56  BILITOT 0.4 0.7 0.7  PROT 5.3* 6.1* 6.0*  ALBUMIN 2.1* 2.1* 2.1*   Cardiac Enzymes: Recent Labs  Lab 01/07/23 0809 01/09/23 1743 01/10/23 0557 01/13/23 0619  CKTOTAL 20* 25* 47* 22*   CBG: Recent Labs  Lab 01/12/23 0810 01/12/23 1616 01/12/23 2024 01/12/23 2333 01/13/23 0407  GLUCAP 79 131* 99 96 94    No results found for this or any previous visit (from the past 240 hour(s)).   Radiology Studies: DG CHEST PORT 1 VIEW  Result Date: 01/11/2023 CLINICAL DATA:  Hypoxia, aspiration pneumonia, epistaxis EXAM: PORTABLE CHEST 1 VIEW COMPARISON:  01/04/2023 FINDINGS: 2 frontal views of the chest demonstrates stable AICD. Enteric catheter passes below diaphragm tip excluded by collimation. Cardiac silhouette is enlarged but stable. Continued ectasia of the thoracic aorta. Interval worsening of interstitial and perihilar airspace opacities bilaterally, with small bilateral pleural effusions suspected. No pneumothorax. No acute bony abnormalities. IMPRESSION: 1. Worsening interstitial and airspace opacities bilaterally, which could reflect worsening infection or edema. 2. Small bilateral pleural  effusions, new since prior study. Electronically Signed   By: Sharlet Salina M.D.   On: 01/11/2023 16:39    Scheduled Meds:  sodium chloride   Intravenous Once   acetaminophen  1,000 mg Oral TID   amiodarone  400 mg Oral BID   apixaban  5 mg Oral BID   atorvastatin  20 mg Oral QHS   budesonide (PULMICORT) nebulizer solution  0.25 mg Nebulization BID   diclofenac Sodium  4 g Topical QID   docusate sodium  100 mg Oral BID   feeding supplement  237 mL Oral TID BM   feeding supplement (NEPRO CARB STEADY)  1,000 mL Per Tube Q24H   furosemide  20 mg Oral BID   gabapentin  100 mg Oral QHS   Gerhardt's butt cream   Topical BID   ipratropium-albuterol  3 mL Nebulization BID   lidocaine  2 patch Transdermal Q24H   megestrol  400 mg Oral BID   metoprolol tartrate  75 mg Oral BID   midodrine  5 mg Oral TID WC   multivitamin  1 tablet Oral QHS   oxymetazoline  1 spray Each Nare BID   pantoprazole  40 mg Oral BID   pyridostigmine  60 mg Oral Q8H   sertraline  25 mg Oral Daily   sodium zirconium cyclosilicate  10 g Oral BID   sucralfate  1 g Oral TID WC & HS   ticagrelor  90 mg Oral BID   Or   ticagrelor  90 mg Per Tube BID   Continuous Infusions:  sodium chloride Stopped (01/06/23 0517)   DAPTOmycin (CUBICIN) 600 mg in sodium chloride 0.9 % IVPB 124 mL/hr at 01/13/23 0353    LOS: 15 days   Time spent: 55 minutes  Azucena Fallen, DO [facility]  If 7PM-7AM, please contact night-coverage [email or web address] 01/13/2023, 6:52 AM

## 2023-01-13 NOTE — Progress Notes (Signed)
Patient ID: Matthew Zamora, male   DOB: 03-Jun-1941, 82 y.o.   MRN: [id number]    Progress Note from the Palliative Medicine Team at [facility]   Patient Name: Matthew Zamora        Date: 01/13/2023 DOB: 29-Dec-1940  Age: 82 y.o. MRN#: [id number] Attending Physician: Azucena Fallen, MD Primary Care Physician: Jerl Mina, MD Admit Date: 12/29/2022   Extensive chart review has been completed prior to meeting with patient/family  including labs, vital signs, imaging, progress/consult notes, orders, medications and available advance directive documents.    Per intake H&P --> 17 year old man with myriad of comorobidities including refractory orthostatic hypotension, HFrEF, chronic N/V who presented w/ SOB at [facility] acteremic with MRSA as well has + for rhinovirus. (+( R sided weakness w/ L ICA thrombus transferred to [facility] for emergent thrombectomy.   Palliative consulted to assist with clarification of GOCs in the setting of serious life limiting illness and overall failure to thrive.  This NP assessed patient at the bedside, attending has asked family to come to the bedside 2/2 to patient's decline over the past few hours, patient's refusing to wear BiPap and expressing his readiness to die.  DIL/Matthew Zamora at bedside and patient's son/Matthew Zamora on the phone. Family has had conversations with Dr Natale Milch throughout the day.   Education offered on patient's high risk for continued decompensation in spite of full medical support and that without BiPap support prognosis is limited       Son/Matthew Zamora is leaving [place] and driving to the hospital.  Dr Natale Milch in discussion with patient and family as patient expresses wishes to  move toward a comfort focused path.      I returned later in the afternoon (1645) to the room, family at bedside ( son/Matthew Zamora, DIL/Matthew Zamora, Ex-wife and her husband, a grandson and great-grand daughter)  with questions and concerns regarding decision to focus on  comfort allowing for a natural death.  Education offered on patient's current medical situation and his expressed wishes to minimize life prolonging measures (BiPap and blood draws).     I directly spoke to Matthew Zamora, explaining that without the use of BiPap, his death/prognosis is likely hours to days.   Education offered on the use of medications to support his comfort and dignity.  He communicates understanding.   Family report that patient's "other" son is on his way to the hospital and at that time they wish to talk with attending again, they want to "make sure patient's understands" his situation.   I encouraged family to contemplate  patient's current complicated/serious medical situation specific to MRSA bacteremia and that the potential source is from ICD which at this time cannot be extracted, CHF/EF 30%/severe nutritional deficits, acute on chronic kidney disease, mal-nutrition.   Education offered regarding human mortality, adult failure to thrive and the limitations of medical interventions to prolong quality of life when a body fails to thrive.  We discussed quality of life.   Detailed education offered on the difference between a full medical support path attempting to prolong life versus a palliative comfort path allowing for natural death.   Questions and concerns addressed     Discussed with Dr Natale Milch via secure chat and bedside RN   Family await further conversation with Dr Natale Milch once other family member arrives   Time:  80  minutes  Detailed review of medical records ( labs, imaging, vital signs), medically appropriate exam ( MS, skin, resp)   discussed  with treatment team, counseling and education to patient, family, staff, documenting clinical information, medication management, coordination of care    Lorinda Creed NP  Palliative Medicine Team Team Phone # [phone] Pager [phone]

## 2023-01-13 NOTE — Plan of Care (Signed)
  Problem: Coping: Goal: Level of anxiety will decrease 01/13/2023 1656 by Ileana Ladd, RN Outcome: Progressing 01/13/2023 1651 by Ileana Ladd, RN Outcome: Progressing   Problem: Pain Managment: Goal: General experience of comfort will improve Outcome: Progressing   Problem: Safety: Goal: Ability to remain free from injury will improve Outcome: Progressing

## 2023-01-13 NOTE — Progress Notes (Signed)
PT Cancellation Note  Patient Details Name: Matthew Zamora MRN: [id number] DOB: 1941-01-16   Cancelled Treatment:    Reason Eval/Treat Not Completed: Patient not medically ready Pt had to go on Bipap due to SpO2 in the 70s. PT to hold this date and return as able, as appropriate to progress mobility.  Lewis Shock, PT, DPT Acute Rehabilitation Services Secure chat preferred Office #: [phone]    Iona Hansen 01/13/2023, 9:28 AM

## 2023-01-14 LAB — GLUCOSE, CAPILLARY
Glucose-Capillary: 111 mg/dL — ABNORMAL HIGH (ref 70–99)
Glucose-Capillary: 138 mg/dL — ABNORMAL HIGH (ref 70–99)

## 2023-01-14 MED ORDER — LORAZEPAM 2 MG/ML IJ SOLN: 1.0000 mg | Freq: Four times a day (QID) | INTRAMUSCULAR | Status: DC | PRN

## 2023-01-14 MED ORDER — MORPHINE SULFATE (PF) 2 MG/ML IV SOLN
1.0000 mg | INTRAVENOUS | Status: DC | PRN
  Administered 2023-01-14: 2 mg via INTRAVENOUS
  Filled 2023-01-14: qty 1

## 2023-01-14 MED ORDER — MIRTAZAPINE 15 MG PO TBDP
15.0000 mg | ORAL_TABLET | Freq: Every day | ORAL | Status: DC
  Administered 2023-01-14 – 2023-01-15 (×2): 15 mg via ORAL
  Filled 2023-01-14 (×3): qty 1

## 2023-01-14 MED ORDER — MORPHINE SULFATE (CONCENTRATE) 10 MG/0.5ML PO SOLN
5.0000 mg | ORAL | Status: DC | PRN
  Administered 2023-01-14 – 2023-01-16 (×3): 5 mg via SUBLINGUAL
  Filled 2023-01-14 (×4): qty 0.5

## 2023-01-14 MED ORDER — ORAL CARE MOUTH RINSE: 15.0000 mL | OROMUCOSAL | Status: DC | PRN

## 2023-01-14 MED ORDER — LORAZEPAM 1 MG PO TABS
1.0000 mg | ORAL_TABLET | ORAL | Status: DC | PRN
  Administered 2023-01-15 – 2023-01-16 (×2): 1 mg via ORAL
  Filled 2023-01-14 (×2): qty 1

## 2023-01-14 MED ORDER — ATROPINE SULFATE 1 % OP SOLN: 4.0000 [drp] | OPHTHALMIC | Status: DC | PRN

## 2023-01-14 MED ORDER — LORAZEPAM 2 MG/ML PO CONC: 1.0000 mg | ORAL | Status: DC | PRN

## 2023-01-14 MED ORDER — MORPHINE SULFATE (CONCENTRATE) 10 MG/0.5ML PO SOLN
5.0000 mg | ORAL | Status: DC | PRN
  Administered 2023-01-15 (×2): 10 mg via ORAL
  Administered 2023-01-15: 5 mg via ORAL
  Administered 2023-01-16 (×3): 10 mg via ORAL
  Administered 2023-01-16: 5 mg via ORAL
  Administered 2023-01-16: 10 mg via ORAL
  Filled 2023-01-14 (×7): qty 0.5

## 2023-01-14 MED ORDER — ORAL CARE MOUTH RINSE
15.0000 mL | OROMUCOSAL | Status: DC
  Administered 2023-01-14 – 2023-01-15 (×5): 15 mL via OROMUCOSAL

## 2023-01-14 NOTE — Progress Notes (Signed)
Patient is alert and oriented x4 but is lethargic. Son and daughter in law at bedside. Patient had an episode of a nosebleed  this morning which was quickly resolved with afrin spray. He had 2 doses of morphine IV for neck pain. Po meds being held due to inability to tolerate removal of nonrebreather. He refused Bipap and has been on a nonrebreather mask since yesterday. Was some confusion regarding comfort care measures since order was placed. Night shift provider came and spoke with family in room and were referred to day team. Continues to be DNR for now. Patient refused q2h turns stating that he was comfortable the way he was. Additional needs denied.

## 2023-01-14 NOTE — Progress Notes (Signed)
   01/14/23 0740  BiPAP/CPAP/SIPAP  Reason BIPAP/CPAP not in use Non-compliant   Pt being fed by family at this time

## 2023-01-14 NOTE — Plan of Care (Signed)
Problem: Fluid Volume: Goal: Hemodynamic stability will improve Outcome: Not Progressing   Problem: Clinical Measurements: Goal: Diagnostic test results will improve Outcome: Not Progressing Goal: Signs and symptoms of infection will decrease Outcome: Not Progressing   Problem: Respiratory: Goal: Ability to maintain adequate ventilation will improve Outcome: Not Progressing   Problem: Education: Goal: Knowledge of General Education information will improve Description: Including pain rating scale, medication(s)/side effects and non-pharmacologic comfort measures Outcome: Not Progressing   Problem: Health Behavior/Discharge Planning: Goal: Ability to manage health-related needs will improve Outcome: Not Progressing   Problem: Clinical Measurements: Goal: Ability to maintain clinical measurements within normal limits will improve Outcome: Not Progressing Goal: Will remain free from infection Outcome: Not Progressing Goal: Diagnostic test results will improve Outcome: Not Progressing Goal: Respiratory complications will improve Outcome: Not Progressing Goal: Cardiovascular complication will be avoided Outcome: Not Progressing   Problem: Activity: Goal: Risk for activity intolerance will decrease Outcome: Not Progressing   Problem: Nutrition: Goal: Adequate nutrition will be maintained Outcome: Not Progressing   Problem: Coping: Goal: Level of anxiety will decrease Outcome: Not Progressing   Problem: Elimination: Goal: Will not experience complications related to bowel motility Outcome: Not Progressing Goal: Will not experience complications related to urinary retention Outcome: Not Progressing   Problem: Pain Managment: Goal: General experience of comfort will improve Outcome: Not Progressing   Problem: Safety: Goal: Ability to remain free from injury will improve Outcome: Not Progressing   Problem: Skin Integrity: Goal: Risk for impaired skin integrity  will decrease Outcome: Not Progressing   Problem: Education: Goal: Understanding of CV disease, CV risk reduction, and recovery process will improve Outcome: Not Progressing Goal: Individualized Educational Video(s) Outcome: Not Progressing   Problem: Activity: Goal: Ability to return to baseline activity level will improve Outcome: Not Progressing   Problem: Cardiovascular: Goal: Ability to achieve and maintain adequate cardiovascular perfusion will improve Outcome: Not Progressing Goal: Vascular access site(s) Level 0-1 will be maintained Outcome: Not Progressing   Problem: Health Behavior/Discharge Planning: Goal: Ability to safely manage health-related needs after discharge will improve Outcome: Not Progressing   Problem: Education: Goal: Knowledge of disease or condition will improve Outcome: Not Progressing Goal: Knowledge of secondary prevention will improve (MUST DOCUMENT ALL) Outcome: Not Progressing Goal: Knowledge of patient specific risk factors will improve Loraine Leriche N/A or DELETE if not current risk factor) Outcome: Not Progressing   Problem: Ischemic Stroke/TIA Tissue Perfusion: Goal: Complications of ischemic stroke/TIA will be minimized Outcome: Not Progressing   Problem: Coping: Goal: Will verbalize positive feelings about self Outcome: Not Progressing Goal: Will identify appropriate support needs Outcome: Not Progressing   Problem: Health Behavior/Discharge Planning: Goal: Ability to manage health-related needs will improve Outcome: Not Progressing Goal: Goals will be collaboratively established with patient/family Outcome: Not Progressing   Problem: Self-Care: Goal: Ability to participate in self-care as condition permits will improve Outcome: Not Progressing Goal: Verbalization of feelings and concerns over difficulty with self-care will improve Outcome: Not Progressing Goal: Ability to communicate needs accurately will improve Outcome: Not  Progressing   Problem: Nutrition: Goal: Risk of aspiration will decrease Outcome: Not Progressing Goal: Dietary intake will improve Outcome: Not Progressing   Problem: Education: Goal: Knowledge of disease or condition will improve Outcome: Not Progressing Goal: Knowledge of secondary prevention will improve (MUST DOCUMENT ALL) Outcome: Not Progressing Goal: Knowledge of patient specific risk factors will improve Loraine Leriche N/A or DELETE if not current risk factor) Outcome: Not Progressing   Problem: Ischemic Stroke/TIA Tissue Perfusion: Goal: Complications  of ischemic stroke/TIA will be minimized Outcome: Not Progressing   Problem: Coping: Goal: Will verbalize positive feelings about self Outcome: Not Progressing Goal: Will identify appropriate support needs Outcome: Not Progressing   Problem: Health Behavior/Discharge Planning: Goal: Ability to manage health-related needs will improve Outcome: Not Progressing Goal: Goals will be collaboratively established with patient/family Outcome: Not Progressing   Problem: Self-Care: Goal: Ability to participate in self-care as condition permits will improve Outcome: Not Progressing Goal: Verbalization of feelings and concerns over difficulty with self-care will improve Outcome: Not Progressing Goal: Ability to communicate needs accurately will improve Outcome: Not Progressing

## 2023-01-14 NOTE — Progress Notes (Signed)
Nutrition Brief Note  Chart reviewed. Pt transitioning to comfort care per MD note today. No further nutrition interventions planned at this time. Will d/c Ensure Enlive oral nutrition supplements. Please re-consult as needed.   Mertie Clause, MS, RD, LDN Registered Dietitian II Please see AMiON for contact information.

## 2023-01-14 NOTE — Progress Notes (Signed)
Patient ID: Matthew Zamora, male   DOB: 1941/01/02, 82 y.o.   MRN: [id number]    Progress Note from the Palliative Medicine Team at [facility]   Patient Name: Matthew Zamora        Date: 01/14/2023 DOB: Nov 11, 1940  Age: 82 y.o. MRN#: [id number] Attending Physician: Azucena Fallen, MD Primary Care Physician: Jerl Mina, MD Admit Date: 12/29/2022   Extensive chart review has been completed prior to meeting with patient/family  including labs, vital signs, imaging, progress/consult notes, orders, medications and available advance directive documents.    Per intake H&P --> 82 year old man with myriad of comorobidities including refractory orthostatic hypotension, HFrEF, chronic N/V who presented w/ SOB at [facility] acteremic with MRSA as well has + for rhinovirus. (+( R sided weakness w/ L ICA thrombus transferred to [facility] for emergent thrombectomy.   Palliative consulted to assist with clarification of GOCs in the setting of serious life limiting illness and overall failure to thrive.  This NP assessed patient at the bedside, as follow up to yesterday's ongoing conversation regarding clarification of patient's EOL wishes.   DIL/Matthew Zamora  and son/Matthew Zamora at bedside.  Attempted to explore patient's  and family's understanding of current medical situation.   Family expressing frustration with "how things are happening",  they tell me they "are not ready to let him go"     Education offered on the seriousness of patient's current medical situation and the limited medical interventions available for Mr. Dennis to prolong life.  Education offered regarding patient's chronic congestive heart failure with reduced ejection fraction, MRSA bacteremia with likely source from cardiac device, nutritional deficiency, anemia and overall failure to thrive.  Education offered on patient's autonomy as it relates to end-of-life decisions.   Occasion offered on the difference between a full medical  support path and a palliative comfort path for this patient at this time in this situation.  I expressed my concern that patient is high risk for decompensation and spite of full medical support anything can happen at any time.  Emotional support offered, as we explored family's processing of anticipated loss and grief.  Again preparing family that prognosis is limited.          Patient continues to waffle between "just go ahead and do what ever you have to do" versus a verbalization of a more comfort path allowing for natural death.  For now patient and family wish to continue with all offered and available medical interventions to prolong life.  They wish to speak directly with the attending.  Questions and concerns addressed     Discussed with Dr Natale Milch via secure chat and bedside RN    Time:  81   minutes  Detailed review of medical records ( labs, imaging, vital signs), medically appropriate exam ( MS, skin, resp)   discussed with treatment team, counseling and education to patient, family, staff, documenting clinical information, medication management, coordination of care    Lorinda Creed NP  Palliative Medicine Team Team Phone # [phone] Pager [phone]

## 2023-01-14 NOTE — Progress Notes (Signed)
PROGRESS NOTE    TORRANCE HON  GNF:[id number] DOB: 05-May-1941 DOA: 12/29/2022 PCP: Jerl Mina, MD   Brief Narrative:  Patient is an 82 year old male with past medical history significant for refractory orthostatic hypotension, CHF with reduced EF, chronic nausea and vomiting.  Patient initially presented to [facility] with acute shortness of breath having found to be in heart failure exacerbation exacerbated by A-fib with RVR.  At [facility] patient was hypotensive requiring fluids placed on amiodarone drip with profoundly elevated fever placed on broad-spectrum antibiotics with cultures ultimately presenting with MRSA bacteremia.  Respiratory viral panel was also positive at this time for rhinovirus.  Acutely 7/14 patient suffered right sided acute weakness and aphasia CTA showing acute left occlusion of left ICA patient evaluated by neurology recommending transfer to [facility] for evaluation by neurointerventional radiology with left carotid stent placement.  Infectious disease team consulted given MRSA positive bacteremia, initial plan was for 6 weeks IV vancomycin(now daptomycin) and transition to doxycycline for life/ongoing prophylaxis.  Discussed transitioning to linezolid as well if more appropriate.  Complicating this is implanted device, AICD, which is unable to be properly evaluated for concurrent infection given TEE is likely not going to be tolerated by the patient.  EP following along, appreciate insight and recommendations - patient appears to be poor candidate for device extraction as well.  7/24 -patient now noted to have melena with downtrending hemoglobin concerning for occult GI bleed.  Will discuss with team given patient's current medications include Eliquis and Brilinta given recent stent placement. Spoke with NeuroIR Dr Corliss Skains - if we have to hold medications would start with holding Brilinta alone. Discussed with family the risks/benefits of holding  anti-plt/anticoagulation in this patient.  7/26 -lengthy goals of care conversation with patient, 3 sons and their wives in regards to patient's current medical status, risks of decompensation and general morbidity mortality given his multiple diagnoses.  Patient indicates he would not want a feeding tube and is currently filling out a MOST form with family and palliative care so that we have a better idea of his wishes moving forward.  7/27-28 Patient is now DNR per discussion/MOST form -potassium remains somewhat stable over the past 24 hours on scheduled Lokelma.  7/29 Patient continues to decline, creatinine worsened overnight, remains volume overloaded with poor ability to diurese, he has worsening anemia likely in the setting of GI bleed while on Eliquis and Brilinta.  We have discussed the risk factors of discontinuing anticoagulation and antiplatelets versus continuing them.  Patient's respiratory status this morning continues to worsen likely in setting of volume overload.  Patient is able to heroic measures, would not want dialysis, unclear if he would be a candidate regardless, at this time he is BiPAP dependent for respiratory support.  He has made his wishes well-known to myself palliative care team and nursing staff as well as daughter-in-law that he is "done with all this" -family en route to the hospital, once family arrives BiPAP will be discontinued patient will be transitioned to comfort measures with morphine and Ativan per protocol as needed for comfort.  At this time suspect patient will have in-hospital demise in the setting of multiple comorbid conditions and worsening status over the past 72 hours.  7/30 miscommunication/misunderstanding between family patient and staff over the past 12 hours in regards to patient's goals of care moving forward.  Patient today confirmed that he would like to be kept comfortable no aggressive or heroic measures.  We again discussed goals of comfort  measures with quality of life and to avoid discomfort/pain/anxiety.  At this time we will continue nonrebreather at this time, de-escalate as symptoms evolved.  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 Comfort measures ongoing  Left ICA thrombus, s/p thrombectomy and left ICA stent placement: -NeuroIR planning to continue Eliquis, Brilinta.   Acute symptomatic anemia, rule out acute GI bleed  -Downtrending hemoglobin s/p 2 unit PRBC transfused 7/24 -Hgb continues to downtrend - although no further dark/black stools reported -Transitioning to comfort measures this afternoon when family arrives  Hyperkalemia, stabilizing -Potassium previously markedly elevated, no clear offending medications. -Transitioning to comfort measures this afternoon when family arrives  MRSA bacteremia, potential source from cardiac device, resolving Septic shock, resolved: -Transitioning to comfort measures this afternoon when family arrives no further lab draws - will DC antibiotics -Previous plan: If TEE cannot be performed, completed 6 weeks of IV daptomycin, then doxycycline for life (i.e. depending on goals of care).  Zyvox is an option as well.  -Patient has an ICD in place with concerns of vegetation.  Currently no plans for extraction but likely will need TEE first to evaluate the device which would be a moderate to high risk procedure - anticoagulation will also need to be held for this to move forward which is complicated by above symptomatic anemia as well as recent stent and thrombectomy and chronic A-fib.   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   No evidence of discitis seen on CT.  CK normal.  Pain control, physical therapy.  Voltaren gel   Acute on chronic CKD stage IIIb NAGMA Hyponatremia, improving Baseline creatinine around 1.5, essentially back to baseline.   Thrombocytopenia, resolved Platelets had drifted down to 92 at one point    GERD -PPI   Chronic hypotension On midodrine 30 mg 3 times daily, Mestinon   Depression/anxiety -Continue home sertraline 25 mg daily    Acute metabolic encephalopathy secondary to sepsis, improved Improved, Aox4   DVT prophylaxis:   Code Status:   Code Status: DNR Family Communication: At bedside, multiple members during comfort measures transition   Consultants:  ID Neurointerventional radiology EP Palliative care  Procedures:  Right carotid thrombectomy and stent placement  Antimicrobials:  Anti-infectives (From admission, onward)    Start     Dose/Rate Route Frequency Ordered Stop   01/09/23 1400  DAPTOmycin (CUBICIN) 600 mg in sodium chloride 0.9 % IVPB  Status:  Discontinued        8 mg/kg  75.8 kg 124 mL/hr over 30 Minutes Intravenous Every 48 hours 01/08/23 0814 01/13/23 1313   01/07/23 1400  DAPTOmycin (CUBICIN) 600 mg in sodium chloride 0.9 % IVPB  Status:  Discontinued  8 mg/kg  75.8 kg 124 mL/hr over 30 Minutes Intravenous Daily 01/07/23 0859 01/08/23 0814   12/30/22 2100   vancomycin (VANCOREADY) IVPB 750 mg/150 mL  Status:  Discontinued        750 mg 150 mL/hr over 60 Minutes Intravenous Every 24 hours 12/30/22 1037 12/30/22 1427   12/30/22 2100  vancomycin (VANCOCIN) IVPB 1000 mg/200 mL premix  Status:  Discontinued        1,000 mg 200 mL/hr over 60 Minutes Intravenous Every 24 hours 12/30/22 1427 01/07/23 0859   12/29/22 2000  vancomycin (VANCOREADY) IVPB 1250 mg/250 mL        1,250 mg 166.7 mL/hr over 90 Minutes Intravenous  Once 12/29/22 1928 12/30/22 0154   12/29/22 1917  vancomycin variable dose per unstable renal function (pharmacist dosing)  Status:  Discontinued         Does not apply See admin instructions 12/29/22 1928 12/30/22 1037      Subjective: Worsening clinical status overnight, patient reports feeling "terrible" without specific complaints other than shortness of breath and fatigue.  Reports that he is "done with all this" as we discussed palliative care as above.  Objective: Vitals:   01/13/23 2032 01/13/23 2346 01/14/23 0100 01/14/23 0259  BP: 100/66 (!) 145/72  119/83  Pulse: 93 100 98 97  Resp:   16 20  Temp: 98.3 F (36.8 C) (!) 97.4 F (36.3 C)  (!) 97.4 F (36.3 C)  TempSrc: Axillary Axillary  Axillary  SpO2:  100% 100%   Weight:      Height:        Intake/Output Summary (Last 24 hours) at 01/14/2023 0739 Last data filed at 01/14/2023 0300 Gross per 24 hour  Intake 360 ml  Output 1050 ml  Net -690 ml   Filed Weights   01/10/23 0320 01/12/23 0328 01/13/23 0408  Weight: 76.6 kg 75.9 kg 73.9 kg    Examination:  General: Sitting up in bed tolerating BiPAP HEENT: Core track noted Lungs: Coarse breath sounds bilaterally without overt wheezes or rales. Heart: Irregularly irregular.  Without murmurs, rubs, or gallops. Abdomen:  Soft, nontender, nondistended.  Without guarding or rebound. Extremities: Without cyanosis, clubbing, edema, or obvious deformity. Skin:  Warm and dry, no erythema.  Data Reviewed: I have  personally reviewed following labs and imaging studies  CBC: Recent Labs  Lab 01/08/23 0253 01/08/23 0856 01/09/23 1157 01/10/23 0557 01/11/23 0601 01/13/23 0619  WBC 11.5*  --  14.1* 10.9* 15.9* 14.8*  HGB 6.9* 6.7* 8.9* 7.7* 7.9* 6.7*  HCT 21.3* 21.7* 27.4* 24.0* 24.4* 21.3*  MCV 91.8  --  90.4 91.6 93.5 96.8  PLT 276  --  291 259 339 313   Basic Metabolic Panel: Recent Labs  Lab 01/08/23 0253 01/09/23 0359 01/10/23 1412 01/10/23 1751 01/10/23 2218 01/11/23 0601 01/13/23 0619  NA 131*   < > 132* 132* 133* 130* 134*  K 5.5*   < > 5.6* 5.7* 5.1 5.1 5.1  CL 98   < > 98 99 99 97* 100  CO2 24   < > 24 22 24 24 22   GLUCOSE 121*   < > 106* 107* 121* 143* 103*  BUN 77*   < > 97* 99* 98* 100* 108*  CREATININE 2.11*   < > 2.42* 2.42* 2.45* 2.35* 2.69*  CALCIUM 8.6*   < > 8.9 8.8* 8.7* 9.1 9.1  MG 2.2  --   --   --   --   --   --    < > =  values in this interval not displayed.   GFR: Estimated Creatinine Clearance: 21.2 mL/min (A) (by C-G formula based on SCr of 2.69 mg/dL (H)). Liver Function Tests: Recent Labs  Lab 01/11/23 0601 01/13/23 0619  AST 40 39  ALT 42 39  ALKPHOS 77 56  BILITOT 0.7 0.7  PROT 6.1* 6.0*  ALBUMIN 2.1* 2.1*   Cardiac Enzymes: Recent Labs  Lab 01/07/23 0809 01/09/23 1743 01/10/23 0557 01/13/23 0619  CKTOTAL 20* 25* 47* 22*   CBG: Recent Labs  Lab 01/13/23 1217 01/13/23 1704 01/13/23 2030 01/13/23 2345 01/14/23 0305  GLUCAP 106* 137* 137* 122* 111*    No results found for this or any previous visit (from the past 240 hour(s)).   Radiology Studies: No results found.  Scheduled Meds:  sodium chloride   Intravenous Once   acetaminophen  1,000 mg Oral TID   amiodarone  200 mg Oral Daily   budesonide (PULMICORT) nebulizer solution  0.25 mg Nebulization BID   diclofenac Sodium  4 g Topical QID   docusate sodium  100 mg Oral BID   feeding supplement  237 mL Oral TID BM   gabapentin  100 mg Oral QHS   Gerhardt's butt cream    Topical BID   ipratropium-albuterol  3 mL Nebulization BID   lidocaine  2 patch Transdermal Q24H   megestrol  400 mg Oral BID   multivitamin  1 tablet Oral QHS   oxymetazoline  1 spray Each Nare BID   pantoprazole  40 mg Oral BID   pyridostigmine  60 mg Oral Q8H   sertraline  25 mg Oral Daily   sodium zirconium cyclosilicate  10 g Oral BID   sucralfate  1 g Oral TID WC & HS   Continuous Infusions:  sodium chloride Stopped (01/06/23 0517)    LOS: 16 days   Time spent: 55 minutes  Azucena Fallen, DO [facility]  If 7PM-7AM, please contact night-coverage [email or web address] 01/14/2023, 7:39 AM

## 2023-01-15 DIAGNOSIS — E875 Hyperkalemia: Secondary | ICD-10-CM

## 2023-01-15 DIAGNOSIS — G9341 Metabolic encephalopathy: Secondary | ICD-10-CM

## 2023-01-15 DIAGNOSIS — D649 Anemia, unspecified: Secondary | ICD-10-CM

## 2023-01-15 DIAGNOSIS — L8990 Pressure ulcer of unspecified site, unspecified stage: Secondary | ICD-10-CM | POA: Insufficient documentation

## 2023-01-15 DIAGNOSIS — I4891 Unspecified atrial fibrillation: Secondary | ICD-10-CM

## 2023-01-15 DIAGNOSIS — K219 Gastro-esophageal reflux disease without esophagitis: Secondary | ICD-10-CM

## 2023-01-15 DIAGNOSIS — I959 Hypotension, unspecified: Secondary | ICD-10-CM

## 2023-01-15 DIAGNOSIS — I5022 Chronic systolic (congestive) heart failure: Secondary | ICD-10-CM

## 2023-01-15 NOTE — Progress Notes (Signed)
PROGRESS NOTE    Matthew Zamora  ZSW:[id number] DOB: 11/14/1940 DOA: 12/29/2022 PCP: Jerl Mina, MD    No chief complaint on file.   Brief Narrative:  Patient is an 82 year old male with past medical history significant for refractory orthostatic hypotension, CHF with reduced EF, chronic nausea and vomiting.  Patient initially presented to [facility] with acute shortness of breath having found to be in heart failure exacerbation exacerbated by A-fib with RVR.   At [facility] patient was hypotensive requiring fluids placed on amiodarone drip with profoundly elevated fever placed on broad-spectrum antibiotics with cultures ultimately presenting with MRSA bacteremia.  Respiratory viral panel was also positive at this time for rhinovirus.   Acutely 7/14 patient suffered right sided acute weakness and aphasia CTA showing acute left occlusion of left ICA patient evaluated by neurology recommending transfer to [facility] for evaluation by neurointerventional radiology with left carotid stent placement.   Infectious disease team consulted given MRSA positive bacteremia, initial plan was for 6 weeks IV vancomycin(now daptomycin) and transition to doxycycline for life/ongoing prophylaxis.  Discussed transitioning to linezolid as well if more appropriate.  Complicating this is implanted device, AICD, which is unable to be properly evaluated for concurrent infection given TEE is likely not going to be tolerated by the patient.  EP following along, appreciate insight and recommendations - patient appears to be poor candidate for device extraction as well.   7/24 -patient now noted to have melena with downtrending hemoglobin concerning for occult GI bleed.  Will discuss with team given patient's current medications include Eliquis and Brilinta given recent stent placement. Spoke with NeuroIR Dr Corliss Skains - if we have to hold medications would start with holding Brilinta alone. Discussed with family the  risks/benefits of holding anti-plt/anticoagulation in this patient.   7/26 -lengthy goals of care conversation with patient, 3 sons and their wives in regards to patient's current medical status, risks of decompensation and general morbidity mortality given his multiple diagnoses.  Patient indicates he would not want a feeding tube and is currently filling out a MOST form with family and palliative care so that we have a better idea of his wishes moving forward.   7/27-28 Patient is now DNR per discussion/MOST form -potassium remains somewhat stable over the past 24 hours on scheduled Lokelma.   7/29 Patient continues to decline, creatinine worsened overnight, remains volume overloaded with poor ability to diurese, he has worsening anemia likely in the setting of GI bleed while on Eliquis and Brilinta.  We have discussed the risk factors of discontinuing anticoagulation and antiplatelets versus continuing them.  Patient's respiratory status this morning continues to worsen likely in setting of volume overload.  Patient is able to heroic measures, would not want dialysis, unclear if he would be a candidate regardless, at this time he is BiPAP dependent for respiratory support.  He has made his wishes well-known to myself palliative care team and nursing staff as well as daughter-in-law that he is "done with all this" -family en route to the hospital, once family arrives BiPAP will be discontinued patient will be transitioned to comfort measures with morphine and Ativan per protocol as needed for comfort.  At this time suspect patient will have in-hospital demise in the setting of multiple comorbid conditions and worsening status over the past 72 hours.   7/30 miscommunication/misunderstanding between family patient and staff over the past 12 hours in regards to patient's goals of care moving forward.  Patient today confirmed that he would like  to be kept comfortable no aggressive or heroic measures.  We again  discussed goals of comfort measures with quality of life and to avoid discomfort/pain/anxiety.  At this time we will continue nonrebreather at this time, de-escalate as symptoms evolved.     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  2.  Acute symptomatic anemia, rule out GI bleed -Patient noted to have downtrending hemoglobin status post transfusion 2 units PRBCs 7/24 -Patient with no melanotic or bloody stools noted. -Hemoglobin noted to be downtrending last hemoglobin 6.7 on 01/13/2023. -Patient transitioned to comfort measures.  3.  Hyperkalemia -Resolved on on Lokelma. -Patient transition to comfort measures.  4.  MRSA bacteremia, potential source from cardiac device, resolving's/septic shock resolved -Patient noted to have transition to comfort measures, no further lab draws, antibiotics have been discontinued. --Previous plan: If TEE cannot be performed, completed 6 weeks of IV daptomycin, then doxycycline for life (i.e. depending on goals of care).  Zyvox is an option as well.  -Patient has an ICD in place with concerns of vegetation.  Currently no plans for extraction but likely will need TEE first to evaluate the device which would be a moderate to high risk procedure - anticoagulation will also need to be held for this to move forward which is complicated by above symptomatic anemia as well as recent stent and thrombectomy and chronic A-fib.  5.  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  6.  A-fib with RVR -Continue amiodarone for rate control. -Patient with no signs of bleeding. -Patient transition to full comfort measures.  7.  Severe  protein calorie malnutrition -NG tube discontinued. -Patient transition to full comfort measures.  8.  Left shoulder pain/severe C6-C7 neuroforaminal stenosis -Noted on left cervical spine. -No evidence of discitis seen on CT.  CK normal. -Voltaren gel, pain control.  9.  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  10.  Thrombocytopenia -Resolved.  11.  GERD -PPI.  12.  Chronic hypotension -Continue midodrine, Mestinon.  13.  Depression/anxiety -Sertraline.  14.  Acute metabolic encephalopathy secondary to sepsis -Improved.  15.  Goals of care -Patient noted to have transition to comfort measures, patient awake alert and oriented and goals of care discussion was done per Dr. Natale Milch, palliative care nursing staff and it is noted that patient stated  "he is done with all this", daughter-in-law was noted to be at bedside during that conversation. -Patient transition to comfort measures.  15. Pressure injury Pressure Injury 11/05/22 Buttocks Bilateral Stage 2 -  Partial thickness loss of dermis presenting as a shallow open injury with a red, pink wound bed without slough. (Active)  11/05/22 1715  Location: Buttocks  Location Orientation: Bilateral  Staging: Stage 2 -  Partial thickness loss of dermis presenting as a shallow open injury with a red, pink wound bed without slough.  Wound Description (Comments):   Present on Admission:      Pressure Injury 01/05/23 Lip Left Stage 1 -  Intact skin with non-blanchable redness of a localized area usually over a bony prominence. Stage 1 MDRI (Active)  01/05/23 2000  Location: Lip  Location Orientation: Left  Staging: Stage 1 -  Intact skin  with non-blanchable redness of a localized area usually over a bony prominence.  Wound Description (Comments): Stage 1 MDRI  Present on Admission: No      DVT prophylaxis: Comfort care Code Status: Full Family Communication: Updated patient, daughter-in-law,  son at bedside. Disposition: TBD.  Status is: Inpatient Remains inpatient appropriate because: Severity of illness   Consultants:  ID Neurointerventional radiology Electrophysiology Palliative care  Procedures:  CT head without contrast 12/29/2022, 12/30/2022 CT C-spine and T-spine 12/31/2022 CT angiogram head and neck 12/29/2022 Left common carotid arteriogram 12/29/2022 -Subocclusive long segment thrombus in the proximal one third of the left internal carotid artery associated with underlying severe stenoses.  Status post complete revascularization of the  proximal left internal carotid artery with 1 pass with a 6 mm x 47 embotrap  retrieval device with contact aspiration, and proximal flow arrest revealing underlying severe stenosis at the distal aspect of the bulb.   Status post stent assisted angioplasty of proximal Lt ICA stenosis with proximal and distal protection.-Per neuro IR, Dr. Gavin Pound show on 12/29/2022  Antimicrobials:  Anti-infectives (From admission, onward)    Start     Dose/Rate Route Frequency Ordered Stop   01/09/23 1400  DAPTOmycin (CUBICIN) 600 mg in sodium chloride 0.9 % IVPB  Status:  Discontinued        8 mg/kg  75.8 kg 124 mL/hr over 30 Minutes Intravenous Every 48 hours 01/08/23 0814 01/13/23 1313   01/07/23 1400  DAPTOmycin (CUBICIN) 600 mg in sodium chloride 0.9 % IVPB  Status:  Discontinued        8 mg/kg  75.8 kg 124 mL/hr over 30 Minutes Intravenous Daily 01/07/23 0859 01/08/23 0814   12/30/22 2100  vancomycin (VANCOREADY) IVPB 750 mg/150 mL  Status:  Discontinued        750 mg 150 mL/hr over 60 Minutes Intravenous Every 24 hours 12/30/22 1037 12/30/22 1427   12/30/22 2100  vancomycin (VANCOCIN) IVPB 1000 mg/200 mL premix  Status:  Discontinued        1,000 mg 200 mL/hr over 60 Minutes Intravenous Every 24 hours 12/30/22 1427 01/07/23 0859   12/29/22 2000  vancomycin (VANCOREADY) IVPB 1250 mg/250 mL        1,250 mg 166.7 mL/hr over 90 Minutes  Intravenous  Once 12/29/22 1928 12/30/22 0154   12/29/22 1917  vancomycin variable dose per unstable renal function (pharmacist dosing)  Status:  Discontinued         Does not apply See admin instructions 12/29/22 1928 12/30/22 1037         Subjective: Sitting up in bed with nonrebreather on.  Denies any chest pain no significant shortness of breath.  Denies any abdominal pain.  States pain is currently controlled.  Daughter-in-law and son at bedside.  Objective: Vitals:   01/14/23 0833 01/14/23 1956 01/15/23 0700 01/15/23 0900  BP: (!) 106/51 102/67  130/71  Pulse: 97 (!) 117  (!) 127  Resp: (!) 21 17 18 20   Temp:  98.3 F (36.8 C)  97.8 F (36.6 C)  TempSrc:  Axillary  Axillary  SpO2: 100% 93% 96%   Weight:      Height:        Intake/Output Summary (Last 24 hours) at 01/15/2023 1131 Last data filed at 01/15/2023 1007 Gross per 24 hour  Intake 240 ml  Output 1150 ml  Net -910 ml   Filed Weights   01/10/23 0320 01/12/23 0328 01/13/23 0408  Weight: 76.6 kg 75.9 kg 73.9 kg  Examination:  General exam: Appears calm and comfortable  Respiratory system: Clear to auscultation.  No wheezes, no crackles, no rhonchi.  Fair air movement.  Respiratory effort normal. Cardiovascular system: S1 & S2 heard, RRR. No JVD, murmurs, rubs, gallops or clicks. No pedal edema. Gastrointestinal system: Abdomen is nondistended, soft and nontender. No organomegaly or masses felt. Normal bowel sounds heard. Central nervous system: Alert and oriented. No focal neurological deficits. Extremities: Symmetric 5 x 5 power. Skin: No rashes, lesions or ulcers Psychiatry: Judgement and insight appear normal. Mood & affect appropriate.     Data Reviewed: I have personally reviewed following labs and imaging studies  CBC: Recent Labs  Lab 01/09/23 1157 01/10/23 0557 01/11/23 0601 01/13/23 0619  WBC 14.1* 10.9* 15.9* 14.8*  HGB 8.9* 7.7* 7.9* 6.7*  HCT 27.4* 24.0* 24.4* 21.3*  MCV 90.4 91.6  93.5 96.8  PLT 291 259 339 313    Basic Metabolic Panel: Recent Labs  Lab 01/10/23 1412 01/10/23 1751 01/10/23 2218 01/11/23 0601 01/13/23 0619  NA 132* 132* 133* 130* 134*  K 5.6* 5.7* 5.1 5.1 5.1  CL 98 99 99 97* 100  CO2 24 22 24 24 22   GLUCOSE 106* 107* 121* 143* 103*  BUN 97* 99* 98* 100* 108*  CREATININE 2.42* 2.42* 2.45* 2.35* 2.69*  CALCIUM 8.9 8.8* 8.7* 9.1 9.1    GFR: Estimated Creatinine Clearance: 21.2 mL/min (A) (by C-G formula based on SCr of 2.69 mg/dL (H)).  Liver Function Tests: Recent Labs  Lab 01/11/23 0601 01/13/23 0619  AST 40 39  ALT 42 39  ALKPHOS 77 56  BILITOT 0.7 0.7  PROT 6.1* 6.0*  ALBUMIN 2.1* 2.1*    CBG: Recent Labs  Lab 01/13/23 1704 01/13/23 2030 01/13/23 2345 01/14/23 0305 01/14/23 0831  GLUCAP 137* 137* 122* 111* 138*     No results found for this or any previous visit (from the past 240 hour(s)).       Radiology Studies: No results found.      Scheduled Meds:  sodium chloride   Intravenous Once   acetaminophen  1,000 mg Oral TID   amiodarone  200 mg Oral Daily   budesonide (PULMICORT) nebulizer solution  0.25 mg Nebulization BID   diclofenac Sodium  4 g Topical QID   docusate sodium  100 mg Oral BID   gabapentin  100 mg Oral QHS   Gerhardt's butt cream   Topical BID   ipratropium-albuterol  3 mL Nebulization BID   lidocaine  2 patch Transdermal Q24H   megestrol  400 mg Oral BID   mirtazapine  15 mg Oral QHS   multivitamin  1 tablet Oral QHS   mouth rinse  15 mL Mouth Rinse 4 times per day   pantoprazole  40 mg Oral BID   pyridostigmine  60 mg Oral Q8H   sertraline  25 mg Oral Daily   sodium zirconium cyclosilicate  10 g Oral BID   sucralfate  1 g Oral TID WC & HS   Continuous Infusions:  sodium chloride Stopped (01/06/23 0517)     LOS: 17 days    Time spent: 35 minutes    Ramiro Harvest, MD [facility]   To contact the attending provider between 7A-7P or the covering provider  during after hours 7P-7A, please log into the web site [email or web address] and access using universal [place] password for that web site. If you do not have the password, please call the hospital operator.  01/15/2023, 11:31 AM

## 2023-01-15 NOTE — Progress Notes (Signed)
Patient ID: Matthew Zamora, male   DOB: Apr 09, 1941, 82 y.o.   MRN: [id number]    Progress Note from the Palliative Medicine Team at [facility]   Patient Name: Matthew Zamora        Date: 01/15/2023 DOB: 17-Apr-1941  Age: 82 y.o. MRN#: [id number] Attending Physician: Rodolph Bong, MD Primary Care Physician: Jerl Mina, MD Admit Date: 12/29/2022   Extensive chart review has been completed prior to meeting with patient/family  including labs, vital signs, imaging, progress/consult notes, orders, medications and available advance directive documents.    Per intake H&P --> 31 year old man with myriad of comorobidities including refractory orthostatic hypotension, HFrEF, chronic N/V who presented w/ SOB at [facility] acteremic with MRSA as well has + for rhinovirus. (+( R sided weakness w/ L ICA thrombus transferred to [facility] for emergent thrombectomy.   Palliative consulted to assist with clarification of GOCs in the setting of serious life limiting illness and overall failure to thrive.  This NP assessed patient at the bedside, as follow up to yesterday's ongoing conversation regarding treatment plan and patient's EOL wishes.   DIL/[name]  at bedside.  Patient appears weaker today, he is eating an icee with the help of his DIL.  With face mask for oxygen delivery.      Margot verbalizes that all are on board that focus of care is comfort.  Continue with face mask for oxygen, no escalation to BiPAP, family wishes to continue with oral medications as long as patient can tolerate them.   Emotional support offered.   Again preparing family that prognosis is limited, could be days to weeks anything could happen at any time.  Questions and concerns addressed     Discussed with bedside RN    Time:  25   minutes  Detailed review of medical records ( labs, imaging, vital signs), medically appropriate exam ( MS, skin, resp)   discussed with treatment team, counseling and education to  patient, family, staff, documenting clinical information, medication management, coordination of care    Lorinda Creed NP  Palliative Medicine Team Team Phone # [phone] Pager [phone]

## 2023-01-16 MED ORDER — POLYETHYLENE GLYCOL 3350 17 G PO PACK: 17.0000 g | PACK | Freq: Every day | ORAL | 0 refills | Status: DC | PRN

## 2023-01-16 MED ORDER — METOPROLOL TARTRATE 5 MG/5ML IV SOLN: 5.0000 mg | Freq: Once | INTRAVENOUS | Status: DC

## 2023-01-16 MED ORDER — LORAZEPAM 2 MG/ML PO CONC: 0.5000 mg | ORAL | Status: DC | PRN

## 2023-01-16 MED ORDER — LORAZEPAM 2 MG/ML PO CONC: 1.0000 mg | ORAL | Status: DC | PRN

## 2023-01-16 MED ORDER — AMIODARONE HCL 200 MG PO TABS: 200.0000 mg | ORAL_TABLET | Freq: Every day | ORAL | Status: DC

## 2023-01-16 MED ORDER — GERHARDT'S BUTT CREAM: 1.0000 | TOPICAL_CREAM | Freq: Two times a day (BID) | CUTANEOUS | Status: DC

## 2023-01-16 MED ORDER — IPRATROPIUM-ALBUTEROL 0.5-2.5 (3) MG/3ML IN SOLN: 3.0000 mL | Freq: Two times a day (BID) | RESPIRATORY_TRACT | Status: DC

## 2023-01-16 MED ORDER — LORAZEPAM 1 MG PO TABS: 1.0000 mg | ORAL_TABLET | ORAL | Status: DC | PRN

## 2023-01-16 MED ORDER — LIDOCAINE 5 % EX PTCH: 2.0000 | MEDICATED_PATCH | CUTANEOUS | 0 refills | Status: DC

## 2023-01-16 MED ORDER — MIRTAZAPINE 15 MG PO TBDP: 15.0000 mg | ORAL_TABLET | Freq: Every day | ORAL | 0 refills | Status: DC

## 2023-01-16 MED ORDER — ATROPINE SULFATE 1 % OP SOLN: 4.0000 [drp] | OPHTHALMIC | 0 refills | Status: DC | PRN

## 2023-01-16 MED ORDER — ACETAMINOPHEN 650 MG RE SUPP
650.0000 mg | RECTAL | Status: DC | PRN
  Filled 2023-01-16: qty 1

## 2023-01-16 MED ORDER — LORAZEPAM 2 MG/ML PO CONC: 0.5000 mg | ORAL | 0 refills | Status: DC | PRN

## 2023-01-16 MED ORDER — DICLOFENAC SODIUM 1 % EX GEL: 4.0000 g | Freq: Four times a day (QID) | CUTANEOUS | Status: DC

## 2023-01-16 MED ORDER — MORPHINE SULFATE (CONCENTRATE) 10 MG/0.5ML PO SOLN: 5.0000 mg | ORAL | 0 refills | Status: DC | PRN

## 2023-01-16 MED ORDER — FUROSEMIDE 10 MG/ML IJ SOLN
20.0000 mg | Freq: Once | INTRAMUSCULAR | Status: AC
  Administered 2023-01-16: 20 mg via INTRAVENOUS
  Filled 2023-01-16 (×2): qty 2

## 2023-01-16 MED ORDER — FUROSEMIDE 40 MG PO TABS: 40.0000 mg | ORAL_TABLET | Freq: Every day | ORAL | Status: DC | PRN

## 2023-01-16 MED ORDER — BUDESONIDE 0.25 MG/2ML IN SUSP: 0.2500 mg | Freq: Two times a day (BID) | RESPIRATORY_TRACT | Status: DC

## 2023-01-16 NOTE — Progress Notes (Addendum)
[facility] Liaison Note  Referral received for patient/family interest in hospice home. Chart under review by [facility] physician.   Bed offered and accepted for transfer today to [facility]. Unit RN please call report to [phone] prior to patient leaving the unit. Please send signed DNR and paperwork with patient.   Please call with any questions and concerns. Thank you  Dionicio Stall, [facility] Liaison [phone]

## 2023-01-16 NOTE — Progress Notes (Signed)
Family requested trial off NRB on to HFNC for comfort.  Currently pt on 10 lpm HFNC, sat 96%, no distress noted, pt currently appears comfortable.

## 2023-01-16 NOTE — Discharge Summary (Signed)
Physician Discharge Summary  Matthew Zamora JYN:[id number] DOB: 1940/08/23 DOA: 12/29/2022  PCP: Jerl Mina, MD  Admit date: 12/29/2022 Discharge date: 01/16/2023  Time spent: 60 minutes  Recommendations for Outpatient Follow-up:  Follow-up with MD at residential hospice home.   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 Katrinka Blazing 82 year old male patient who presented to the emergency room at a [facility] with acute onset of shortness of breath. Denied any chest pain, no change in medications. After diagnostic evaluation in the ER he was initially admitted with working diagnosis of acute on chronic heart failure with associated respiratory failure complicated further by atrial fibrillation with RVR . He was treated with IV diuresis as well as calcium channel blockade course further complicated later following admission by hypotension requiring fluid resuscitation on 7/13 he continued to have atrial fibrillation with RVR he was started on amiodarone infusion and cardiology was consulted. He also spiked fever at which time cultures and respiratory viral panel were sent and he was empirically started on cefepime and vancomycin. He was moved to the intensive care. Amiodarone later placed on hold due to hypotension with resuscitation focus changed to volume resuscitation. BC ID panel was positive for Staph aureus/MRSA from blood cultures. Respiratory viral panel positive for rhinovirus. On 7/14 a code stroke was called around 8:30 in the  morning when he developed sudden onset of right-sided weakness and aphasia as well as some agitation. A stat CT angiogram was obtained this showed nearly occlusive ICA thrombus on the left. Neurology and vascular surgery were consulted he was started on norepinephrine infusion to maintain mean arterial blood pressure and transferred to [facility] for neuro interventional radiology intervention critical care asked to assume primary care   Hospital Course:  Brief Narrative:  Patient is an 82 year old male with past medical history significant for refractory orthostatic hypotension, CHF with reduced EF, chronic nausea and vomiting.  Patient initially presented to [facility] with acute shortness of breath having found to be in heart failure exacerbation exacerbated by A-fib with RVR.   At [facility] patient was hypotensive requiring fluids placed on amiodarone drip with profoundly elevated fever placed on broad-spectrum antibiotics with cultures ultimately presenting with MRSA bacteremia.  Respiratory viral panel was also positive at this time for rhinovirus.   Acutely 7/14 patient suffered right sided acute weakness and aphasia CTA showing acute left occlusion of left ICA patient evaluated by neurology recommending transfer to [facility] for evaluation by neurointerventional radiology with left carotid stent placement.   Infectious disease team consulted given MRSA positive bacteremia, initial plan was for 6 weeks IV vancomycin(now daptomycin) and transition to doxycycline for life/ongoing prophylaxis.  Discussed transitioning to linezolid as well if more appropriate.  Complicating this is implanted device, AICD, which is unable to be properly evaluated for concurrent infection given TEE is likely not going to be tolerated by the patient.  EP following along, appreciate insight and recommendations - patient appears to be poor candidate for device extraction as well.   7/24 -patient now noted to have  melena with  downtrending hemoglobin concerning for occult GI bleed.  Will discuss with team given patient's current medications include Eliquis and Brilinta given recent stent placement. Spoke with NeuroIR Dr Corliss Skains - if we have to hold medications would start with holding Brilinta alone. Discussed with family the risks/benefits of holding anti-plt/anticoagulation in this patient.   7/26 -lengthy goals of care conversation with patient, 3 sons and their wives in regards to patient's current medical status, risks of decompensation and general morbidity mortality given his multiple diagnoses.  Patient indicates he would not want a feeding tube and is currently filling out a MOST form with family and palliative care so that we have a better idea of his wishes moving forward.   7/27-28 Patient is now DNR per discussion/MOST form -potassium remains somewhat stable over the past 24 hours on scheduled Lokelma.   7/29 Patient continues to decline, creatinine worsened overnight, remains volume overloaded with poor ability to diurese, he has worsening anemia likely in the setting of GI bleed while on Eliquis and Brilinta.  We have discussed the risk factors of discontinuing anticoagulation and antiplatelets versus continuing them.  Patient's respiratory status this morning continues to worsen likely in setting of volume overload.  Patient is able to heroic measures, would not want dialysis, unclear if he would be a candidate regardless, at this time he is BiPAP dependent for respiratory support.  He has made his wishes well-known to myself palliative care team and nursing staff as well as daughter-in-law that he is "done with all this" -family en route to the hospital, once family arrives BiPAP will be discontinued patient will be transitioned to comfort measures with morphine and Ativan per protocol as needed for comfort.  At this time suspect patient will have in-hospital demise in the setting of multiple comorbid conditions  and worsening status over the past 72 hours.   7/30 miscommunication/misunderstanding between family patient and staff over the past 12 hours in regards to patient's goals of care moving forward.  Patient today confirmed that he would like to be kept comfortable no aggressive or heroic measures.  We again discussed goals of comfort measures with quality of life and to avoid discomfort/pain/anxiety.  At this time we will continue nonrebreather at this time, de-escalate as symptoms evolved.    #1 left ICA thrombus, status post thrombectomy and left ICA stent placement -Patient was followed by neuro IR who are recommending continuation of Eliquis and Brilinta. -Patient was subsequently transition to full comfort measures and Eliquis and Brilinta subsequently discontinued. -Patient will be transferred to residential hospice home.   2.  Acute symptomatic anemia, rule out GI bleed -Patient noted to have downtrending hemoglobin status post transfusion 2 units PRBCs 7/24 -Patient with no melanotic or bloody stools noted. -Hemoglobin noted to be downtrending last hemoglobin 6.7 on 01/13/2023. -Patient transitioned to comfort measures. -Patient was discharged to residential hospice home   3.  Hyperkalemia -Resolved on on Lokelma. -Patient transitioned to comfort measures.   4.  MRSA bacteremia, potential source from cardiac device, resolving's/septic shock resolved -Patient noted to have transitioned to comfort measures, no further lab draws, antibiotics have been discontinued. -Patient will be discharged to residential hospice home --Previous plan: If TEE cannot be performed, completed 6 weeks of IV daptomycin, then doxycycline for life (i.e. depending on goals of care).  Zyvox is an option as well.  -Patient has an ICD in place with concerns of vegetation.  Currently no plans for extraction but likely will need  TEE first to evaluate the device which would be a moderate to high risk procedure -  anticoagulation will also need to be held for this to move forward which is complicated by above symptomatic anemia as well as recent stent and thrombectomy and chronic A-fib.   5.  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   6.  A-fib with RVR -Patient maintained on amiodarone for rate control. -Patient with no signs of bleeding. -Patient transitioned to full comfort measures.   7.  Severe protein calorie malnutrition -NG tube discontinued. -Patient transitioned to full comfort measures. -Patient was discharged to residential hospice home.   8.  Left shoulder pain/severe C6-C7 neuroforaminal stenosis -Noted on left cervical spine. -No evidence of discitis seen on CT.  CK normal. -Voltaren gel, pain control.   9.  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   10.  Thrombocytopenia -Resolved.   11.  GERD -Patient maintained on PPI.   12.  Chronic hypotension -Patient maintained on home regimen midodrine, Mestinon.   13.  Depression/anxiety -Patient maintained on home regimen sertraline.   14.  Acute metabolic encephalopathy secondary to sepsis -Improved.   15.  Goals of care -Patient noted to have transition to comfort measures, patient awake alert and oriented and goals of care discussion was done per Dr. Natale Milch, palliative care nursing staff and it is noted that patient stated  "he is done with all this", daughter-in-law was noted to be at bedside during that conversation. -Patient transitioned to comfort measures. -Patient Comfortable and patient will be transferred to residential hospice home.   15. Pressure injury Pressure Injury 11/05/22 Buttocks Bilateral Stage 2 -  Partial thickness loss of dermis presenting as a shallow open injury with a red, pink wound bed without slough. (Active)  11/05/22 1715  Location: Buttocks  Location Orientation: Bilateral  Staging: Stage 2 -   Partial thickness loss of dermis presenting as a shallow open injury with a red, pink wound bed without slough.  Wound Description (Comments):   Present on Admission:      Pressure Injury 01/05/23 Lip Left Stage 1 -  Intact skin with non-blanchable redness of a localized area usually over a bony prominence. Stage 1 MDRI (Active)  01/05/23 2000  Location: Lip  Location Orientation: Left  Staging: Stage 1 -  Intact skin with non-blanchable redness of a localized area usually over a bony prominence.  Wound Description (Comments): Stage 1 MDRI  Present on Admission: No       Procedures: CT head without contrast 12/29/2022, 12/30/2022 CT C-spine and T-spine 12/31/2022 CT angiogram head and neck 12/29/2022 Left common carotid arteriogram 12/29/2022 -Subocclusive long segment thrombus in the proximal one third of the left internal carotid artery associated with underlying severe stenoses.  Status post complete revascularization of the  proximal left internal carotid artery with 1 pass with a 6 mm x 47 embotrap  retrieval device with contact aspiration, and proximal flow arrest revealing underlying severe stenosis at the distal aspect of the bulb.   Status post stent assisted angioplasty of proximal Lt ICA stenosis with proximal and distal protection.-Per neuro IR, Dr. Corliss Skains on 12/29/2022  Significant Hospital Events: Including procedures, antibiotic start and stop dates in addition to other pertinent events   7/12 admitted [facility] with shortness of breath and initially a working diagnosis of acute decompensated heart failure 7/13 new fevers.  Blood cultures positive for MRSA antibiotic started, Vanco and cefepime hypotensive.  Having trouble with atrial fibrillation with RVR initially started on amiodarone which was subsequently discontinued 7/14 acute neurological decompensation.  CT angiogram showed partially occlusive thrombus in the left ICA.  Transferred to [facility] for neuro interventional  radiology consultation and treatment.  Echocardiogram shows global hypokinesis worse in the septum LVEF down to 30 to 35% left ventricular hypertrophy diastolic parameters indeterminate RV size normal no mention of vegetation. Transferred to [facility] where he underwent  complete revascularization of the  proximal left internal carotid artery with 1 pass with a 6 mm x 47 embotrap  retrieval device with contact aspiration, and subsequent stent assisted angioplasty placed on Cangrelor for 4 hours. Transferred to ICU post op    Consultations: ID Neurointerventional radiology Electrophysiology Palliative care  Discharge Exam: Vitals:   01/16/23 1030 01/16/23 1200  BP:    Pulse:    Resp:    Temp: (!) 103.7 F (39.8 C) (!) 102.4 F (39.1 C)  SpO2:      General: On nonrebreather. Cardiovascular: Irregularly irregular. Respiratory: Diffuse crackles.  No wheezing.  On nonrebreather.  Discharge Instructions   Discharge Instructions     Diet general   Complete by: As directed    Discharge wound care:   Complete by: As directed    Pressure Injury 11/05/22 Buttocks Bilateral Stage 2 -  Partial thickness loss of dermis presenting as a shallow open injury with a red, pink wound bed without slough. 71 days    Pressure Injury 01/05/23 Lip Left Stage 1 -  Intact skin with non-blanchable redness of a localized area usually over a bony prominence. Stage 1 MDRI 10 days   Increase activity slowly   Complete by: As directed       Allergies as of 01/16/2023       Reactions   Nitrofuran Derivatives Other (See Comments)   Transaminitis    Spironolactone Other (See Comments)   Significant transaminitis         Medication List     STOP taking these medications    (feeding supplement) PROSource Plus liquid   albuterol (2.5 MG/3ML) 0.083% nebulizer solution Commonly known as: PROVENTIL   ascorbic acid 500 MG tablet Commonly known as: VITAMIN C   atorvastatin 80 MG tablet Commonly known  as: LIPITOR   Eliquis 2.5 MG Tabs tablet Generic drug: apixaban   guaiFENesin-dextromethorphan 100-10 MG/5ML syrup Commonly known as: ROBITUSSIN DM   magnesium oxide 400 (240 Mg) MG tablet Commonly known as: MAG-OX   melatonin 3 MG Tabs tablet   multivitamin with minerals Tabs tablet   mupirocin ointment 2 % Commonly known as: BACTROBAN   sucralfate 1 GM/10ML suspension Commonly known as: CARAFATE   tadalafil 20 MG tablet Commonly known as: CIALIS       TAKE these medications    acetaminophen 325 MG tablet Commonly known as: TYLENOL Take 2 tablets (650 mg total) by mouth every 4 (four) hours as needed for headache or mild pain.   amiodarone 200 MG tablet Commonly known as: PACERONE Take 1 tablet (200 mg total) by mouth daily. Start taking on: January 17, 2023   atropine 1 % ophthalmic solution Place 4 drops under the tongue every 4 (four) hours as needed (excessive secretions).   budesonide 0.25 MG/2ML nebulizer solution Commonly known as: PULMICORT Take 2 mLs (0.25 mg total) by nebulization 2 (two) times daily.   calcium carbonate 750 MG chewable tablet Commonly known as: TUMS EX Chew 750 mg by mouth every evening.   diclofenac  Sodium 1 % Gel Commonly known as: VOLTAREN Apply 4 g topically 4 (four) times daily.   fluticasone-salmeterol 500-50 MCG/ACT Aepb Commonly known as: ADVAIR Inhale 1 puff into the lungs in the morning and at bedtime.   furosemide 40 MG tablet Commonly known as: LASIX Take 1 tablet (40 mg total) by mouth daily as needed for fluid or edema. What changed:  medication strength how much to take when to take this reasons to take this   gabapentin 100 MG capsule Commonly known as: NEURONTIN Take 100 mg by mouth at bedtime.   Gerhardt's butt cream Crea Apply 1 Application topically 2 (two) times daily.   ipratropium-albuterol 0.5-2.5 (3) MG/3ML Soln Commonly known as: DUONEB Take 3 mLs by nebulization 2 (two) times daily.    lidocaine 5 % Commonly known as: LIDODERM Place 2 patches onto the skin daily. Remove & Discard patch within 12 hours or as directed by MD Start taking on: January 17, 2023   LORazepam 2 MG/ML concentrated solution Commonly known as: ATIVAN Take 0.3 mLs (0.6 mg total) by mouth every 2 (two) hours as needed for sedation, sleep, seizure or anxiety (dyspnea).   metoCLOPramide 5 MG tablet Commonly known as: REGLAN Take 1 tablet (5 mg total) by mouth every 8 (eight) hours as needed for refractory nausea / vomiting. What changed: when to take this   Metoprolol Tartrate 37.5 MG Tabs Take 1 tablet (37.5 mg total) by mouth 2 (two) times daily.   midodrine 5 MG tablet Commonly known as: PROAMATINE Take 1 tablet (5 mg total) by mouth 3 (three) times daily with meals.   mirtazapine 15 MG disintegrating tablet Commonly known as: REMERON SOL-TAB Take 1 tablet (15 mg total) by mouth at bedtime.   morphine CONCENTRATE 10 MG/0.5ML Soln concentrated solution Place 0.25 mLs (5 mg total) under the tongue every 2 (two) hours as needed for moderate pain (or dyspnea).   ondansetron 4 MG disintegrating tablet Commonly known as: ZOFRAN-ODT Take 4 mg by mouth as needed for refractory nausea / vomiting, vomiting or nausea.   pantoprazole 40 MG tablet Commonly known as: PROTONIX Take 1 tablet (40 mg total) by mouth 2 (two) times daily.   polyethylene glycol 17 g packet Commonly known as: MIRALAX / GLYCOLAX Take 17 g by mouth daily as needed for moderate constipation. What changed:  when to take this reasons to take this   pyridostigmine 60 MG tablet Commonly known as: MESTINON Take 1 tablet (60 mg total) by mouth every 8 (eight) hours.   sertraline 25 MG tablet Commonly known as: ZOLOFT Take 25 mg by mouth daily.   tamsulosin 0.4 MG Caps capsule Commonly known as: FLOMAX Take 0.4 mg by mouth at bedtime.               Discharge Care Instructions  (From admission, onward)            Start     Ordered   01/16/23 0000  Discharge wound care:       Comments: Pressure Injury 11/05/22 Buttocks Bilateral Stage 2 -  Partial thickness loss of dermis presenting as a shallow open injury with a red, pink wound bed without slough. 71 days    Pressure Injury 01/05/23 Lip Left Stage 1 -  Intact skin with non-blanchable redness of a localized area usually over a bony prominence. Stage 1 MDRI 10 days   01/16/23 1450           Allergies  Allergen Reactions  Nitrofuran Derivatives Other (See Comments)    Transaminitis    Spironolactone Other (See Comments)    Significant transaminitis     Follow-up Information     MD AT [facility] Follow up.                   The results of significant diagnostics from this hospitalization (including imaging, microbiology, ancillary and laboratory) are listed below for reference.    Significant Diagnostic Studies: DG CHEST PORT 1 VIEW  Result Date: 01/11/2023 CLINICAL DATA:  Hypoxia, aspiration pneumonia, epistaxis EXAM: PORTABLE CHEST 1 VIEW COMPARISON:  01/04/2023 FINDINGS: 2 frontal views of the chest demonstrates stable AICD. Enteric catheter passes below diaphragm tip excluded by collimation. Cardiac silhouette is enlarged but stable. Continued ectasia of the thoracic aorta. Interval worsening of interstitial and perihilar airspace opacities bilaterally, with small bilateral pleural effusions suspected. No pneumothorax. No acute bony abnormalities. IMPRESSION: 1. Worsening interstitial and airspace opacities bilaterally, which could reflect worsening infection or edema. 2. Small bilateral pleural effusions, new since prior study. Electronically Signed   By: Sharlet Salina M.D.   On: 01/11/2023 16:39   DG CHEST PORT 1 VIEW  Result Date: 01/04/2023 CLINICAL DATA:  Shortness of breath. EXAM: PORTABLE CHEST 1 VIEW COMPARISON:  01/01/2023 FINDINGS: Patchy bilateral airspace opacities are similar to prior with improvement in  aeration at the left base. The cardio pericardial silhouette is enlarged. Dual lead pacer/AICD again noted. Telemetry leads overlie the chest. Feeding tube tip projects in the region of the distal stomach/pylorus. IMPRESSION: Patchy bilateral airspace disease with improvement in aeration at the left base. Feeding tube tip projects at the level of the distal stomach Electronically Signed   By: Kennith Center M.D.   On: 01/04/2023 10:33   DG Abd Portable 1V  Result Date: 01/03/2023 CLINICAL DATA:  Feeding tube placement. EXAM: PORTABLE ABDOMEN - 1 VIEW COMPARISON:  01/01/2023 FINDINGS: Feeding tube has been placed, tip overlying the level of the mid stomach. Partially imaged chest shows cardiomegaly and pacemaker leads. Bowel gas pattern is nonobstructed. IMPRESSION: Feeding tube tip at the level of the mid stomach. Electronically Signed   By: Norva Pavlov M.D.   On: 01/03/2023 12:05   DG Abd Portable 1V  Result Date: 01/01/2023 CLINICAL DATA:  Feeding tube placement EXAM: PORTABLE ABDOMEN - 1 VIEW COMPARISON:  04/30/2018 FINDINGS: AICD leads noted. A feeding tube is present with tip in the stomach antrum. Indistinct left hemidiaphragm, cannot exclude mild left basilar atelectasis. IMPRESSION: 1. Feeding tube tip in the stomach antrum. Electronically Signed   By: Gaylyn Rong M.D.   On: 01/01/2023 16:31   DG CHEST PORT 1 VIEW  Result Date: 01/01/2023 CLINICAL DATA:  Possible density in right upper lobe seen in CT thoracic spine done on 12/31/2022 EXAM: PORTABLE CHEST 1 VIEW COMPARISON:  Previous studies including this CT thoracic spine done on 12/31/2022 FINDINGS: Transverse diameter of heart is increased. Thoracic aorta is tortuous and ectatic. Pacemaker/defibrillator battery is seen in the left infraclavicular region. Small faint patchy density is seen in right parahilar region. There is interval appearance of moderate sized area of increased markings in left lower lung field. Left lateral CP  angle is indistinct. There is no pneumothorax. IMPRESSION: Small faint patchy density in right parahilar region may suggest subsegmental atelectasis. There is moderate sized new infiltrate in left lower lung field suggesting atelectasis/pneumonia. Small left pleural effusion. Electronically Signed   By: Ernie Avena M.D.   On: 01/01/2023 13:51  CT CERVICAL SPINE WO CONTRAST  Result Date: 12/31/2022 CLINICAL DATA:  neck pain with bacteremia, ? discitis; Bacteremia. ? discitis EXAM: CT CERVICAL SPINE WITHOUT CONTRAST CT THORACIC SPINE WITHOUT CONTRAST TECHNIQUE: Multidetector CT imaging of the cervical and thoracic spine was performed without contrast. Multiplanar CT image reconstructions were also generated. RADIATION DOSE REDUCTION: This exam was performed according to the departmental dose-optimization program which includes automated exposure control, adjustment of the mA and/or kV according to patient size and/or use of iterative reconstruction technique. COMPARISON:  None Available. FINDINGS: CT CERVICAL SPINE FINDINGS Alignment: Normal. Skull base and vertebrae: C3 inferior endplate and C4 super endplate Schmorl nodes. Multilevel moderate degenerative changes spine. Associated severe osseous neural foraminal stenosis at the left C6-C7 level. No associated severe osseous central canal stenosis. No acute fracture. No aggressive appearing focal osseous lesion or focal pathologic process. Soft tissues and spinal canal: No prevertebral fluid or swelling. No visible canal hematoma. Upper chest: Unremarkable. Other: None. CT THORACIC SPINE FINDINGS Alignment: Normal. Vertebrae: Diffusely decreased bone density. Multilevel mild degenerative changes of the spine. No acute fracture or focal pathologic process. Paraspinal and other soft tissues: Negative. Disc levels: Grossly maintained. Other: Atherosclerotic plaque. Four-vessel coronary artery calcification. Emphysematous changes. Bilateral small pleural  effusions. Interval development of poorly visualized nodular like right upper lobe pulmonary opacity (5:75). Cholelithiasis. Left maxillary sinus mucosal thickening. Atherosclerotic calcifications are present within the cavernous internal carotid arteries. Left carotid artery stent within the neck. IMPRESSION: 1. No CT findings suggest discitis. 2. No acute displaced fracture or traumatic listhesis of the cervical and thoracic spine. 3. Multilevel moderate degenerative changes spine. Associated severe osseous neural foraminal stenosis at the left C6-C7 level. 4. Interval development of poorly visualized nodular like right upper lobe pulmonary opacity. Correlate with chest x-ray. 5. Aortic Atherosclerosis (ICD10-I70.0) and Emphysema (ICD10-J43.9). 6. Cholelithiasis. 7. Left carotid artery stent within the neck. Incomplete evaluation on this noncontrast study. Electronically Signed   By: Tish Frederickson M.D.   On: 12/31/2022 21:19   CT THORACIC SPINE WO CONTRAST  Result Date: 12/31/2022 CLINICAL DATA:  neck pain with bacteremia, ? discitis; Bacteremia. ? discitis EXAM: CT CERVICAL SPINE WITHOUT CONTRAST CT THORACIC SPINE WITHOUT CONTRAST TECHNIQUE: Multidetector CT imaging of the cervical and thoracic spine was performed without contrast. Multiplanar CT image reconstructions were also generated. RADIATION DOSE REDUCTION: This exam was performed according to the departmental dose-optimization program which includes automated exposure control, adjustment of the mA and/or kV according to patient size and/or use of iterative reconstruction technique. COMPARISON:  None Available. FINDINGS: CT CERVICAL SPINE FINDINGS Alignment: Normal. Skull base and vertebrae: C3 inferior endplate and C4 super endplate Schmorl nodes. Multilevel moderate degenerative changes spine. Associated severe osseous neural foraminal stenosis at the left C6-C7 level. No associated severe osseous central canal stenosis. No acute fracture. No  aggressive appearing focal osseous lesion or focal pathologic process. Soft tissues and spinal canal: No prevertebral fluid or swelling. No visible canal hematoma. Upper chest: Unremarkable. Other: None. CT THORACIC SPINE FINDINGS Alignment: Normal. Vertebrae: Diffusely decreased bone density. Multilevel mild degenerative changes of the spine. No acute fracture or focal pathologic process. Paraspinal and other soft tissues: Negative. Disc levels: Grossly maintained. Other: Atherosclerotic plaque. Four-vessel coronary artery calcification. Emphysematous changes. Bilateral small pleural effusions. Interval development of poorly visualized nodular like right upper lobe pulmonary opacity (5:75). Cholelithiasis. Left maxillary sinus mucosal thickening. Atherosclerotic calcifications are present within the cavernous internal carotid arteries. Left carotid artery stent within the neck. IMPRESSION: 1. No CT  findings suggest discitis. 2. No acute displaced fracture or traumatic listhesis of the cervical and thoracic spine. 3. Multilevel moderate degenerative changes spine. Associated severe osseous neural foraminal stenosis at the left C6-C7 level. 4. Interval development of poorly visualized nodular like right upper lobe pulmonary opacity. Correlate with chest x-ray. 5. Aortic Atherosclerosis (ICD10-I70.0) and Emphysema (ICD10-J43.9). 6. Cholelithiasis. 7. Left carotid artery stent within the neck. Incomplete evaluation on this noncontrast study. Electronically Signed   By: Tish Frederickson M.D.   On: 12/31/2022 21:19   IR PERCUTANEOUS ART THROMBECTOMY/INFUSION INTRACRANIAL INC DIAG ANGIO  Result Date: 12/31/2022 INDICATION: New onset aphasia and right-sided weakness. 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 EXAM: 1. EMERGENT LARGE VESSEL OCCLUSION THROMBOLYSIS (anterior  CIRCULATION) COMPARISON:  CT angiogram of the head and neck December 29, 2022. MEDICATIONS: Ancef 2 g IV antibiotic was administered within 1 hour of the procedure. ANESTHESIA/SEDATION: General anesthesia. CONTRAST:  Omnipaque 300 approximately 125 mL. FLUOROSCOPY TIME:  Fluoroscopy Time: 30 minutes 6 seconds (1215 mGy). COMPLICATIONS: None immediate. TECHNIQUE: Following a full explanation of the procedure along with the potential associated complications, an informed witnessed consent was obtained. The risks of intracranial hemorrhage of 10%, worsening neurological deficit, ventilator dependency, death and inability to revascularize were all reviewed in detail with the patient's son. The patient was then put under general anesthesia by the Department of Anesthesiology at [facility]. The right groin was prepped and draped in the usual sterile fashion. Thereafter using modified Seldinger technique, transfemoral access into the right common femoral artery was obtained without difficulty. Over an 0.035 inch guidewire an 8 French 25 cm Pinnacle sheath was inserted. Through this, and also over an 0.035 inch guidewire a 5 [place] JB 1 catheter was advanced to the aortic arch region and selectively positioned in the left common carotid artery. FINDINGS: The left common carotid arteriogram demonstrates the left external carotid artery at its origin to be moderately narrowed. Its branches, however, opacify widely. The left internal carotid artery at the bulb and just distal to this demonstrates irregular left internal carotid artery bulb associated with mild intraluminal irregularities. Just distal to the bulb is a focal high-grade stenosis associated with a long segment smooth tubular filling defect which is nearly occlusive. Distal to this the left internal carotid artery is seen to opacify to the cranial skull base. The petrous, the cavernous and the supraclinoid left ICA are widely patent. Left posterior  communicating artery is seen opacifying the left posterior cerebral artery distribution. The left middle cerebral artery and the left anterior cerebral artery opacify into the capillary and venous phases. The proximal 2/3 of the left transverse sinus demonstrates moderately severe to moderate narrowing. PROCEDURE: Over an 035 inch Roadrunner exchange guidewire, an 087 95 cm balloon guide catheter which had been prepped with 50% contrast and 50% heparinized saline infusion was advanced and positioned just proximal to the left internal carotid artery at the bulb. The guidewire was removed. Good aspiration obtained from the hub of the balloon guide catheter. A gentle control arteriogram performed through this demonstrated no evidence of spasms, dissections or of intraluminal filling defects. With the balloon inflated at the origin of the left internal carotid artery, over an 014 inch standard Aristotle micro guidewire with a moderate J configuration, an 160 cm microcatheter was then advanced ensuring no engagement with the prominent proximal thrombus. The micro guidewire was  advanced into the horizontal petrous segment followed by the microcatheter. The guidewire was removed. Free aspiration of blood at the hub of the microcatheter was noted. Through the microcatheter, a 6.5 mm x 47 mm Emboshield retrieval device was then advanced to the distal end of the microcatheter and deployed in the usual manner. A 071 Zoom aspiration catheter was then advanced into the proximal 1/3 of the left internal carotid artery. With proximal flow arrest, aspiration at the hub of the balloon guide catheter, the combination of the retrieval device, the microcatheter, and the Zoom aspiration catheter was retrieved and removed. Aspiration was continued as the balloon was then deflated. A control arteriogram performed through the balloon guide catheter proximal to the left internal carotid artery bulb demonstrated patency of the lobulated  irregular bulb with free flow no evidence into the left internal carotid artery distally. The previously noted filling defect was no longer evident. A high-grade stenosis was unmasked just distal to the bulb. More distally, the left internal carotid artery distally including the left middle cerebral artery and the left anterior cerebral artery demonstrate wide patency without evidence of intraluminal filling defects, or of occlusions. A 4/7 mm Emboshield filter device was then prepped and purged of air in its housing. The combination of the delivery microcatheter, and the 014 micro guidewire with a moderate J configuration was then advanced to the distal end of the balloon guide catheter. Using a torque device, the filter wire was advanced without any resistance through the stenotic proximal left internal carotid artery followed by the delivery microcatheter. The combination was navigated to the cervical petrous junction. The filter was then deployed in the usual manner. The filter delivery microcatheter was then retrieved and removed. A control arteriogram was then performed through the balloon guide catheter which demonstrated no evidence of dissections or of vasospasm. Measurements were then performed of the proximal left internal carotid artery in its normal segment and also proximally the left common carotid artery. It was decided to proceed with placement of a 9/7 x 40 stent. The stent delivery apparatus was then retrogradely purged with heparinized saline infusion. Using the rapid exchange technique, the stent delivery apparatus was then advanced without difficulty through the proximal left internal carotid artery. The proximal and the distal landing zones were then identified. The stent was then delivered in the usual manner without difficulty. The delivery catheter was then retrieved and removed while maintaining stable distal Emboshield filter device. A control arteriogram performed through the balloon  guide catheter now demonstrated excellent flow through the previously narrowed left ICA and distally. Due to a waist, a 5 mm x 30 mm Viatrac 14 angioplasty balloon guide catheter which had been prepped antegradely and retrogradely was then advanced again using the rapid exchange technique and positioned with the proximal and distal markers of the balloon at the site of the severe stenosis. A control inflation was then performed using micro inflation syringe device via micro tubing. Balloon was inflated to 5 mm where it was maintained for approximately 30 seconds. This was then deflated and retrieved and removed. A control arteriogram performed through the balloon guide catheter demonstrated excellent improved caliber at the previous site of stenosis. Intracranially, no evidence of filling defects or of occlusions was evident. A filter capture device was then advanced under fluoroscopic guidance using the rapid exchange technique to the proximal marker of the filter. The filter was then captured into the filter capture device and retrieved and removed under fluoroscopic guidance. A control arteriogram  performed through the balloon guide catheter now demonstrated excellent revascularization of the previously stenotic left internal carotid artery proximally with no filling defects noted in the left internal carotid artery intracranially or extra cranially. The MCA and the ACA distribution remained patent without any filling defects or of occlusions. The balloon guide catheter was then removed. A 7 French ExoSeal closure device was then deployed with manual compression at the right groin puncture site. Distal pulses remained present bilaterally in both feet unchanged. Patient's general anesthesia was then reversed and the patient was extubated. Patient was slow waking up and not following commands at the time of transportation to the neuro ICU. Medications: Aspirin 81 mg and Brilinta 90 mg through the orogastric tube.  Patient was also given half strength bolus dose of IV followed by a 4 hour low-dose infusion. 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 Status post stent assisted angioplasty of high-grade symptomatic proximal left ICA stenosis with distal protection and proximal flow arrest. PLAN: Follow-up in the clinic 2-4 weeks post discharge. Electronically Signed   By: Julieanne Cotton M.D.   On: 12/31/2022 08:37   IR CT Head Ltd  Result Date: 12/31/2022 INDICATION: New onset aphasia and right-sided weakness. 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 EXAM: 1. EMERGENT LARGE VESSEL OCCLUSION THROMBOLYSIS (anterior CIRCULATION) COMPARISON:  CT angiogram of the head and neck December 29, 2022. MEDICATIONS: Ancef 2 g IV antibiotic was administered within 1 hour of the procedure. ANESTHESIA/SEDATION: General anesthesia. CONTRAST:  Omnipaque 300 approximately 125 mL. FLUOROSCOPY TIME:  Fluoroscopy Time: 30 minutes 6 seconds (1215 mGy). COMPLICATIONS: None immediate. TECHNIQUE: Following a full explanation of the procedure along with the potential associated complications, an informed witnessed consent was obtained. The risks of intracranial hemorrhage of 10%, worsening neurological deficit, ventilator dependency, death and inability to revascularize were all reviewed in detail with the patient's son. The patient was then put under general anesthesia by the Department of Anesthesiology at [facility]. The right groin was prepped and draped in the usual  sterile fashion. Thereafter using modified Seldinger technique, transfemoral access into the right common femoral artery was obtained without difficulty. Over an 0.035 inch guidewire an 8 French 25 cm Pinnacle sheath was inserted. Through this, and also over an 0.035 inch guidewire a 5 [place] JB 1 catheter was advanced to the aortic arch region and selectively positioned in the left common carotid artery. FINDINGS: The left common carotid arteriogram demonstrates the left external carotid artery at its origin to be moderately narrowed. Its branches, however, opacify widely. The left internal carotid artery at the bulb and just distal to this demonstrates irregular left internal carotid artery bulb associated with mild intraluminal irregularities. Just distal to the bulb is a focal high-grade stenosis associated with a long segment smooth tubular filling defect which is nearly occlusive. Distal to this the left internal carotid artery is seen to opacify to the cranial skull base. The petrous, the cavernous and the supraclinoid left ICA are widely patent. Left posterior communicating artery is seen opacifying the left posterior cerebral artery distribution. The left middle cerebral  artery and the left anterior cerebral artery opacify into the capillary and venous phases. The proximal 2/3 of the left transverse sinus demonstrates moderately severe to moderate narrowing. PROCEDURE: Over an 035 inch Roadrunner exchange guidewire, an 087 95 cm balloon guide catheter which had been prepped with 50% contrast and 50% heparinized saline infusion was advanced and positioned just proximal to the left internal carotid artery at the bulb. The guidewire was removed. Good aspiration obtained from the hub of the balloon guide catheter. A gentle control arteriogram performed through this demonstrated no evidence of spasms, dissections or of intraluminal filling defects. With the balloon inflated at the origin of the left internal  carotid artery, over an 014 inch standard Aristotle micro guidewire with a moderate J configuration, an 160 cm microcatheter was then advanced ensuring no engagement with the prominent proximal thrombus. The micro guidewire was advanced into the horizontal petrous segment followed by the microcatheter. The guidewire was removed. Free aspiration of blood at the hub of the microcatheter was noted. Through the microcatheter, a 6.5 mm x 47 mm Emboshield retrieval device was then advanced to the distal end of the microcatheter and deployed in the usual manner. A 071 Zoom aspiration catheter was then advanced into the proximal 1/3 of the left internal carotid artery. With proximal flow arrest, aspiration at the hub of the balloon guide catheter, the combination of the retrieval device, the microcatheter, and the Zoom aspiration catheter was retrieved and removed. Aspiration was continued as the balloon was then deflated. A control arteriogram performed through the balloon guide catheter proximal to the left internal carotid artery bulb demonstrated patency of the lobulated irregular bulb with free flow no evidence into the left internal carotid artery distally. The previously noted filling defect was no longer evident. A high-grade stenosis was unmasked just distal to the bulb. More distally, the left internal carotid artery distally including the left middle cerebral artery and the left anterior cerebral artery demonstrate wide patency without evidence of intraluminal filling defects, or of occlusions. A 4/7 mm Emboshield filter device was then prepped and purged of air in its housing. The combination of the delivery microcatheter, and the 014 micro guidewire with a moderate J configuration was then advanced to the distal end of the balloon guide catheter. Using a torque device, the filter wire was advanced without any resistance through the stenotic proximal left internal carotid artery followed by the delivery  microcatheter. The combination was navigated to the cervical petrous junction. The filter was then deployed in the usual manner. The filter delivery microcatheter was then retrieved and removed. A control arteriogram was then performed through the balloon guide catheter which demonstrated no evidence of dissections or of vasospasm. Measurements were then performed of the proximal left internal carotid artery in its normal segment and also proximally the left common carotid artery. It was decided to proceed with placement of a 9/7 x 40 stent. The stent delivery apparatus was then retrogradely purged with heparinized saline infusion. Using the rapid exchange technique, the stent delivery apparatus was then advanced without difficulty through the proximal left internal carotid artery. The proximal and the distal landing zones were then identified. The stent was then delivered in the usual manner without difficulty. The delivery catheter was then retrieved and removed while maintaining stable distal Emboshield filter device. A control arteriogram performed through the balloon guide catheter now demonstrated excellent flow through the previously narrowed left ICA and distally. Due to a waist, a 5 mm x 30 mm  Viatrac 14 angioplasty balloon guide catheter which had been prepped antegradely and retrogradely was then advanced again using the rapid exchange technique and positioned with the proximal and distal markers of the balloon at the site of the severe stenosis. A control inflation was then performed using micro inflation syringe device via micro tubing. Balloon was inflated to 5 mm where it was maintained for approximately 30 seconds. This was then deflated and retrieved and removed. A control arteriogram performed through the balloon guide catheter demonstrated excellent improved caliber at the previous site of stenosis. Intracranially, no evidence of filling defects or of occlusions was evident. A filter capture  device was then advanced under fluoroscopic guidance using the rapid exchange technique to the proximal marker of the filter. The filter was then captured into the filter capture device and retrieved and removed under fluoroscopic guidance. A control arteriogram performed through the balloon guide catheter now demonstrated excellent revascularization of the previously stenotic left internal carotid artery proximally with no filling defects noted in the left internal carotid artery intracranially or extra cranially. The MCA and the ACA distribution remained patent without any filling defects or of occlusions. The balloon guide catheter was then removed. A 7 French ExoSeal closure device was then deployed with manual compression at the right groin puncture site. Distal pulses remained present bilaterally in both feet unchanged. Patient's general anesthesia was then reversed and the patient was extubated. Patient was slow waking up and not following commands at the time of transportation to the neuro ICU. Medications: Aspirin 81 mg and Brilinta 90 mg through the orogastric tube. Patient was also given half strength bolus dose of IV followed by a 4 hour low-dose infusion. 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 Status post stent assisted angioplasty of high-grade symptomatic proximal left ICA stenosis with distal protection and proximal flow arrest. PLAN: Follow-up in the clinic 2-4 weeks post discharge. Electronically Signed   By: Julieanne Cotton M.D.   On: 12/31/2022 08:37   IR CT Head Ltd  Result Date: 12/31/2022 INDICATION: New onset aphasia and right-sided weakness.  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 EXAM: 1. EMERGENT LARGE VESSEL OCCLUSION THROMBOLYSIS (anterior CIRCULATION) COMPARISON:  CT angiogram of the head and neck December 29, 2022. MEDICATIONS: Ancef 2 g IV antibiotic was administered within 1 hour of the procedure. ANESTHESIA/SEDATION: General anesthesia. CONTRAST:  Omnipaque 300 approximately 125 mL. FLUOROSCOPY TIME:  Fluoroscopy Time: 30 minutes 6 seconds (1215 mGy). COMPLICATIONS: None immediate. TECHNIQUE: Following a full explanation of the procedure along with the potential associated complications, an informed witnessed consent was obtained. The risks of intracranial hemorrhage of 10%, worsening neurological deficit, ventilator dependency, death and inability to revascularize were all reviewed in detail with the patient's son. The patient was then put under general anesthesia by the Department of Anesthesiology at [facility]. The right groin was prepped and draped in the usual sterile fashion. Thereafter using modified Seldinger technique, transfemoral access into the right common femoral artery was obtained without difficulty. Over an 0.035 inch guidewire an 8 French 25 cm Pinnacle sheath was inserted. Through  this, and also over an 0.035 inch guidewire a 5 [place] JB 1 catheter was advanced to the aortic arch region and selectively positioned in the left common carotid artery. FINDINGS: The left common carotid arteriogram demonstrates the left external carotid artery at its origin to be moderately narrowed. Its branches, however, opacify widely. The left internal carotid artery at the bulb and just distal to this demonstrates irregular left internal carotid artery bulb associated with mild intraluminal irregularities. Just distal to the bulb is a focal high-grade stenosis associated with a long segment smooth  tubular filling defect which is nearly occlusive. Distal to this the left internal carotid artery is seen to opacify to the cranial skull base. The petrous, the cavernous and the supraclinoid left ICA are widely patent. Left posterior communicating artery is seen opacifying the left posterior cerebral artery distribution. The left middle cerebral artery and the left anterior cerebral artery opacify into the capillary and venous phases. The proximal 2/3 of the left transverse sinus demonstrates moderately severe to moderate narrowing. PROCEDURE: Over an 035 inch Roadrunner exchange guidewire, an 087 95 cm balloon guide catheter which had been prepped with 50% contrast and 50% heparinized saline infusion was advanced and positioned just proximal to the left internal carotid artery at the bulb. The guidewire was removed. Good aspiration obtained from the hub of the balloon guide catheter. A gentle control arteriogram performed through this demonstrated no evidence of spasms, dissections or of intraluminal filling defects. With the balloon inflated at the origin of the left internal carotid artery, over an 014 inch standard Aristotle micro guidewire with a moderate J configuration, an 160 cm microcatheter was then advanced ensuring no engagement with the prominent proximal thrombus. The micro guidewire was advanced into the horizontal petrous segment followed by the microcatheter. The guidewire was removed. Free aspiration of blood at the hub of the microcatheter was noted. Through the microcatheter, a 6.5 mm x 47 mm Emboshield retrieval device was then advanced to the distal end of the microcatheter and deployed in the usual manner. A 071 Zoom aspiration catheter was then advanced into the proximal 1/3 of the left internal carotid artery. With proximal flow arrest, aspiration at the hub of the balloon guide catheter, the combination of the retrieval device, the microcatheter, and the Zoom aspiration catheter was  retrieved and removed. Aspiration was continued as the balloon was then deflated. A control arteriogram performed through the balloon guide catheter proximal to the left internal carotid artery bulb demonstrated patency of the lobulated irregular bulb with free flow no evidence into the left internal carotid artery distally. The previously noted filling defect was no longer evident. A high-grade stenosis was unmasked just distal to the bulb. More distally, the left internal carotid artery distally including the left middle cerebral artery and the left anterior cerebral artery demonstrate wide patency without evidence of intraluminal filling defects, or of occlusions. A 4/7 mm Emboshield filter device was then prepped and purged of air in its housing. The combination of the delivery microcatheter, and the 014 micro guidewire with a moderate J configuration was then advanced to the distal end of the balloon guide catheter. Using a torque device, the filter wire was advanced without any resistance through the stenotic proximal left internal carotid artery followed by the delivery microcatheter. The combination was navigated to the cervical petrous junction. The filter was then deployed in the usual manner. The filter delivery microcatheter was then retrieved and removed. A control arteriogram was then performed through the  balloon guide catheter which demonstrated no evidence of dissections or of vasospasm. Measurements were then performed of the proximal left internal carotid artery in its normal segment and also proximally the left common carotid artery. It was decided to proceed with placement of a 9/7 x 40 stent. The stent delivery apparatus was then retrogradely purged with heparinized saline infusion. Using the rapid exchange technique, the stent delivery apparatus was then advanced without difficulty through the proximal left internal carotid artery. The proximal and the distal landing zones were then  identified. The stent was then delivered in the usual manner without difficulty. The delivery catheter was then retrieved and removed while maintaining stable distal Emboshield filter device. A control arteriogram performed through the balloon guide catheter now demonstrated excellent flow through the previously narrowed left ICA and distally. Due to a waist, a 5 mm x 30 mm Viatrac 14 angioplasty balloon guide catheter which had been prepped antegradely and retrogradely was then advanced again using the rapid exchange technique and positioned with the proximal and distal markers of the balloon at the site of the severe stenosis. A control inflation was then performed using micro inflation syringe device via micro tubing. Balloon was inflated to 5 mm where it was maintained for approximately 30 seconds. This was then deflated and retrieved and removed. A control arteriogram performed through the balloon guide catheter demonstrated excellent improved caliber at the previous site of stenosis. Intracranially, no evidence of filling defects or of occlusions was evident. A filter capture device was then advanced under fluoroscopic guidance using the rapid exchange technique to the proximal marker of the filter. The filter was then captured into the filter capture device and retrieved and removed under fluoroscopic guidance. A control arteriogram performed through the balloon guide catheter now demonstrated excellent revascularization of the previously stenotic left internal carotid artery proximally with no filling defects noted in the left internal carotid artery intracranially or extra cranially. The MCA and the ACA distribution remained patent without any filling defects or of occlusions. The balloon guide catheter was then removed. A 7 French ExoSeal closure device was then deployed with manual compression at the right groin puncture site. Distal pulses remained present bilaterally in both feet unchanged. Patient's  general anesthesia was then reversed and the patient was extubated. Patient was slow waking up and not following commands at the time of transportation to the neuro ICU. Medications: Aspirin 81 mg and Brilinta 90 mg through the orogastric tube. Patient was also given half strength bolus dose of IV followed by a 4 hour low-dose infusion. 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 Status post stent assisted angioplasty of high-grade symptomatic proximal left ICA stenosis with distal protection and proximal flow arrest. PLAN: Follow-up in the clinic 2-4 weeks post discharge. Electronically Signed   By: Julieanne Cotton M.D.   On: 12/31/2022 08:37   CT HEAD WO CONTRAST ( )  Result Date: 12/30/2022 CLINICAL DATA:  Stroke follow-up EXAM: CT HEAD WITHOUT CONTRAST TECHNIQUE: Contiguous axial images were obtained from the base of the skull through the vertex without intravenous contrast. RADIATION DOSE REDUCTION: This exam was performed according to the departmental dose-optimization program which includes automated exposure control, adjustment of the mA and/or  kV according to patient size and/or use of iterative reconstruction technique. COMPARISON:  CT/CTA head and neck 1 day prior FINDINGS: Brain: There is no evidence of acute intracranial hemorrhage, extra-axial fluid collection, or acute infarct. Previously seen subarachnoid hemorrhage versus contrast staining overlying the left cerebral hemisphere is no longer appreciated. Parenchymal volume is stable. The ventricles are stable in size. Hypodensity in the supratentorial white matter likely reflects sequela of underlying chronic  small-vessel ischemic change, stable. The pituitary and suprasellar region are normal. There is no mass lesion. There is no mass effect or midline shift. Vascular: There is calcification of the bilateral carotid siphons. Skull: Normal. Negative for fracture or focal lesion. Sinuses/Orbits: The imaged paranasal sinuses are clear. Bilateral lens implants are in place. The globes and orbits are otherwise unremarkable. Other: The mastoid air cells and middle ear cavities are clear. IMPRESSION: Previously seen subarachnoid hemorrhage versus contrast staining overlying the left cerebral hemisphere is no longer appreciated. No new acute intracranial pathology. Electronically Signed   By: Lesia Hausen M.D.   On: 12/30/2022 14:25   CT HEAD WO CONTRAST ( )  Result Date: 12/30/2022 CLINICAL DATA:  TIA EXAM: CT HEAD WITHOUT CONTRAST TECHNIQUE: Contiguous axial images were obtained from the base of the skull through the vertex without intravenous contrast. RADIATION DOSE REDUCTION: This exam was performed according to the departmental dose-optimization program which includes automated exposure control, adjustment of the mA and/or kV according to patient size and/or use of iterative reconstruction technique. COMPARISON:  12/29/2022 CTA head and neck, 9:30 a.m. FINDINGS: Evaluation is somewhat limited by motion artifact. Brain: Suspect trace subarachnoid hyperdense material in the left frontal and parietal lobe, near the vertex (series 10, images 40-41 and series 6, image 15), which is new from the prior exam and may represent trace subarachnoid hemorrhage versus contrast staining from recent interventional procedure. No evidence of acute infarction, mass, mass effect, or midline shift. No hydrocephalus. Vascular: No hyperdense vessel. Skull: Negative for fracture or focal lesion. Sinuses/Orbits: Mucosal thickening in the ethmoid air cells. No acute finding in the orbits. Other: The mastoid air cells are well aerated.  IMPRESSION: Suspect trace subarachnoid hyperdense material in the left frontal and parietal lobe, near the vertex, which is new from the prior exam and may represent trace subarachnoid hemorrhage versus contrast staining from recent interventional procedure. These results will be called to the ordering clinician or representative by the Radiologist Assistant, and communication documented in the PACS or [employer]. Electronically Signed   By: Wiliam Ke M.D.   On: 12/30/2022 00:11   CT ANGIO HEAD NECK W WO CM W PERF (CODE STROKE)  Result Date: 12/29/2022 CLINICAL DATA:  82 year old male inpatient code stroke presentation. Right side weakness and aphasia. EXAM: CT ANGIOGRAPHY HEAD AND NECK WITH AND WITHOUT CONTRAST TECHNIQUE: Multidetector CT imaging of the head and neck was performed using the standard protocol during bolus administration of intravenous contrast. Multiplanar CT image reconstructions and MIPs were obtained to evaluate the vascular anatomy. Carotid stenosis measurements (when applicable) are obtained utilizing NASCET criteria, using the distal internal carotid diameter as the denominator. RADIATION DOSE REDUCTION: This exam was performed according to the departmental dose-optimization program which includes automated exposure control, adjustment of the mA and/or kV according to patient size and/or use of iterative reconstruction technique. CONTRAST:  60mL OMNIPAQUE IOHEXOL 350 MG/ML SOLN COMPARISON:  Plain head CT 10/30/2022. FINDINGS: CT HEAD Brain: No midline shift, ventriculomegaly, mass effect, evidence of mass lesion, intracranial hemorrhage or evidence of cortically  based acute infarction. Gray-white differentiation appears stable, symmetric, within normal limits for age. ASPECTS 10. Calvarium and skull base: No acute osseous abnormality identified. Paranasal sinuses: Visualized paranasal sinuses and mastoids are stable and well aerated. Orbits: No acute orbit or scalp soft tissue  finding. CTA NECK Skeleton: Mild mandible motion artifact. Cervical spine degeneration although generally mild for age. No acute osseous abnormality identified. Upper chest: Left chest pacemaker. Small layering pleural effusions. Mild respiratory motion, mild paraseptal emphysema. Grossly patent visible central pulmonary arteries. Other neck: No acute neck soft tissue finding. Aortic arch: 3 vessel arch. Moderate Calcified aortic atherosclerosis. Right carotid system: Brachiocephalic artery and proximal right CCA are patent with mild plaque and no stenosis. Mild to moderate soft and calcified plaque at the right ICA origin and bulb with no stenosis. Left carotid system: Mild to moderate soft and calcified plaque at the left CCA origin without stenosis. Mild motion artifact. Occasional other left CCA plaque before the bifurcation without stenosis. Moderate calcified plaque at the left ICA origin and bulb. No significant ICA origin stenosis, but beginning at the medial distal bulb there is adherent thrombus within the vessel over a segment of nearly 2 cm. The string like thrombus continues within the lumen to series 9, image 93. The vessel remains patent, but at the distal bulb high-grade stenosis is numerically estimated at 80 % with respect to the distal vessel, series 11, image 195. Superimposed calcified plaque of the left ICA just below the skull base. Vertebral arteries: Proximal subclavian arteries and vertebral artery origins are patent with no significant stenosis despite plaque. The vertebral arteries are diminutive, fairly codominant, and remain patent to the skull base. CTA HEAD Posterior circulation: Distal vertebral arteries and vertebrobasilar junction are diminutive but patent. Left V4 is mildly dominant. Patent bilateral PICA/AICA. Patent basilar artery without stenosis. Patent basilar tip and SCA origins. Fetal type bilateral PCA origins. Tortuous posterior communicating arteries. Bilateral PCA  branches remain patent. On the left there is mild to moderate P3 irregularity and stenosis on series 15, image 29. Anterior circulation: Both ICA siphons are patent and enhance symmetrically. Moderate left siphon calcified plaque. Only mild left siphon stenosis. Normal left posterior communicating artery origin. Similar moderate right siphon calcified plaque. Mild supraclinoid right siphon stenosis. Normal right posterior communicating artery origin. Patent carotid termini. Patent MCA and ACA origins. Anterior communicating artery is within normal limits. Bilateral ACA branches are within normal limits. Right MCA M1 segment and bifurcation are patent without stenosis. Left MCA M1 segment and bifurcation are patent without stenosis. No discrete MCA branch occlusion is identified. Venous sinuses: Patent. Anatomic variants: Diminutive vertebrobasilar system on the basis of fetal PCA origins. Review of the MIP images confirms the above findings Preliminary report of the above Study discussed by telephone with Dr. Ritta Slot on 12/29/2022 at 0930 hours. IMPRESSION: 1. CTA is negative for large vessel occlusion but Positive for bulky, elongated thrombus within the Left ICA beginning at the distal bulb. The vessel remains patent, but with high-grade estimated 80% stenosis. This was discussed by telephone with Dr. Ritta Slot on 12/29/2022 at 0943 hours. 2. Stable and negative for age noncontrast CT appearance of the brain. 3. Atherosclerosis in the head and neck but generally only mild subsequent stenosis. Up to Moderate Left PCA P3 branch stenosis. 4. Aortic Atherosclerosis (ICD10-I70.0). Small layering pleural effusions. Electronically Signed   By: Odessa Fleming M.D.   On: 12/29/2022 09:52   ECHOCARDIOGRAM COMPLETE  Result Date: 12/29/2022  ECHOCARDIOGRAM REPORT   Patient Name:   Matthew Zamora Date of Exam: 12/29/2022 Medical Rec #:  [id number]           Height:       68.0 in Accession #:    [id number]           Weight:       143.7 lb Date of Birth:  24-Sep-1940           BSA:          1.776 m Patient Age:    81 years            BP:           77/49 mmHg Patient Gender: M                   HR:           133 bpm. Exam Location:  [facility] Procedure: 2D Echo Indications:     CHF I50.21  History:         Patient has prior history of Echocardiogram examinations, most                  recent 11/10/2022.  Sonographer:     Overton Mam RDCS, FASE Referring Phys:  [id number] Hubbard Hartshorn OUMA Diagnosing Phys: Chilton Si MD  Sonographer Comments: Technically challenging study due to limited acoustic windows and suboptimal parasternal window. Image acquisition challenging due to uncooperative patient. Unable to perform a complete echo at this time due to patient being extremely uncooperative. IMPRESSIONS  1. Global hypokinesis worse in the septum. Left ventricular ejection fraction, by estimation, is 30 to 35%. The left ventricle has moderately decreased function. The left ventricle demonstrates global hypokinesis. There is mild left ventricular hypertrophy of the septal segment. Left ventricular diastolic parameters are indeterminate.  2. Right ventricular systolic function is normal. The right ventricular size is normal.  3. The mitral valve is normal in structure. Mild mitral valve regurgitation. No evidence of mitral stenosis.  4. The aortic valve is normal in structure. Aortic valve regurgitation is not visualized. No aortic stenosis is present.  5. The inferior vena cava is normal in size with greater than 50% respiratory variability, suggesting right atrial pressure of 3 mmHg. FINDINGS  Left Ventricle: Global hypokinesis worse in the septum. Left ventricular ejection fraction, by estimation, is 30 to 35%. The left ventricle has moderately decreased function. The left ventricle demonstrates global hypokinesis. The left ventricular internal cavity size was normal in size. There is mild left ventricular hypertrophy of  the septal segment. Left ventricular diastolic parameters are indeterminate. Right Ventricle: The right ventricular size is normal. No increase in right ventricular wall thickness. Right ventricular systolic function is normal. Left Atrium: Left atrial size was normal in size. Right Atrium: Right atrial size was normal in size. Pericardium: There is no evidence of pericardial effusion. Mitral Valve: The mitral valve is normal in structure. Mild mitral valve regurgitation. No evidence of mitral valve stenosis. Tricuspid Valve: The tricuspid valve is normal in structure. Tricuspid valve regurgitation is trivial. No evidence of tricuspid stenosis. Aortic Valve: The aortic valve is normal in structure. Aortic valve regurgitation is not visualized. No aortic stenosis is present. Aortic valve peak gradient measures 7.2 mmHg. Pulmonic Valve: The pulmonic valve was normal in structure. Pulmonic valve regurgitation is not visualized. No evidence of pulmonic stenosis. Aorta: The aortic root is normal in size and structure. Venous: The inferior vena cava is normal in size  with greater than 50% respiratory variability, suggesting right atrial pressure of 3 mmHg. IAS/Shunts: No atrial level shunt detected by color flow Doppler.  LEFT VENTRICLE PLAX 2D LVIDd:         5.20 cm     Diastology LVIDs:         4.40 cm     LV e' medial:    8.38 cm/s LV PW:         1.00 cm     LV E/e' medial:  13.6 LV IVS:        1.20 cm     LV e' lateral:   13.90 cm/s LVOT diam:     1.80 cm     LV E/e' lateral: 8.2 LVOT Area:     2.54 cm  LV Volumes (MOD) LV vol d, MOD A4C: 74.3 ml LV vol s, MOD A4C: 48.7 ml LV SV MOD A4C:     74.3 ml LEFT ATRIUM         Index LA diam:    4.00 cm 2.25 cm/m  AORTIC VALVE              PULMONIC VALVE AV Vmax:      134.00 cm/s PV Vmax:       0.93 m/s AV Peak Grad: 7.2 mmHg    PV Peak grad:  3.5 mmHg  AORTA Ao Root diam: 3.60 cm MITRAL VALVE                TRICUSPID VALVE MV Area (PHT): 6.02 cm     TR Peak grad:   23.2  mmHg MV Decel Time: 126 msec     TR Vmax:        241.00 cm/s MV E velocity: 114.00 cm/s                             SHUNTS                             Systemic Diam: 1.80 cm Chilton Si MD Electronically signed by Chilton Si MD Signature Date/Time: 12/29/2022/9:43:31 AM    Final    CT Angio Chest PE W and/or Wo Contrast  Result Date: 12/27/2022 CLINICAL DATA:  Pulmonary embolus suspected with high probability. Shortness of breath. COPD. EXAM: CT ANGIOGRAPHY CHEST WITH CONTRAST TECHNIQUE: Multidetector CT imaging of the chest was performed using the standard protocol during bolus administration of intravenous contrast. Multiplanar CT image reconstructions and MIPs were obtained to evaluate the vascular anatomy. RADIATION DOSE REDUCTION: This exam was performed according to the departmental dose-optimization program which includes automated exposure control, adjustment of the mA and/or kV according to patient size and/or use of iterative reconstruction technique. CONTRAST:  75mL OMNIPAQUE IOHEXOL 350 MG/ML SOLN COMPARISON:  Chest radiograph 12/17/2022.  CT 11/10/2022. FINDINGS: Cardiovascular: Good opacification of the central and segmental pulmonary arteries resulting in technically adequate study. No focal filling defects. No evidence of significant pulmonary embolus. Normal heart size. No pericardial effusions. Normal caliber thoracic aorta. Calcification of the aorta and coronary arteries. Mediastinum/Nodes: Thyroid gland is unremarkable. Esophagus is decompressed. No significant lymphadenopathy. Lungs/Pleura: Emphysematous changes and scattered fibrosis in the lungs. Bronchial wall thickening, subpleural fibrosis and ground-glass changes likely representing changes of respiratory bronchiolitis. No pleural effusions. No pneumothorax. Upper Abdomen: Cholelithiasis with small stone in the gallbladder. No inflammatory infiltration demonstrated. Mass in the inferior right lobe of the liver measuring  4.9  x 5.5 cm in diameter. This has been present on previous studies including CT abdomen and pelvis from 03/27/2022 and dating back to 05/16/2015. Lesion is slowly growing but long-term presence of the lesion suggest likely benign etiology. This could represent a biliary cystadenoma. The lesion is incompletely visualized and not well characterized on this study which was optimized for evaluation of pulmonary arteries. Musculoskeletal: No acute bony abnormalities. Old right rib fractures. Review of the MIP images confirms the above findings. IMPRESSION: 1. No evidence of significant pulmonary embolus. 2. 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 3. Cholelithiasis without inflammatory change. 4. Cystic lesion in the liver is incompletely visualized but present on prior studies. Likely benign. 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 5. Aortic atherosclerosis. Electronically Signed   By: Burman Nieves M.D.   On: 12/27/2022 17:48   DG Chest 2 View  Result Date: 12/27/2022 CLINICAL DATA:  Shortness of breath EXAM: CHEST - 2 VIEW COMPARISON:  Previous studies including the examination of 12/17/2022 FINDINGS: Transverse diameter of heart is increased. Increase in AP diameter of chest suggests COPD. Thoracic aorta is tortuous and ectatic. There are no signs of pulmonary edema or focal pulmonary consolidation. There is interval clearing of vascular congestion. There is no pleural effusion or pneumothorax. Pacemaker/defibrillator battery is seen in the left infraclavicular region. IMPRESSION: Cardiomegaly. There are no signs of pulmonary edema or focal pulmonary consolidation. Electronically Signed   By: Ernie Avena M.D.   On: 12/27/2022 12:35    Microbiology: No results found for this or any previous visit (from the past 240 hour(s)).   Labs: Basic Metabolic Panel: Recent Labs  Lab 01/10/23 1412 01/10/23 1751 01/10/23 2218 01/11/23 0601 01/13/23 0619  NA 132* 132* 133* 130* 134*  K  5.6* 5.7* 5.1 5.1 5.1  CL 98 99 99 97* 100  CO2 24 22 24 24 22   GLUCOSE 106* 107* 121* 143* 103*  BUN 97* 99* 98* 100* 108*  CREATININE 2.42* 2.42* 2.45* 2.35* 2.69*  CALCIUM 8.9 8.8* 8.7* 9.1 9.1   Liver Function Tests: Recent Labs  Lab 01/11/23 0601 01/13/23 0619  AST 40 39  ALT 42 39  ALKPHOS 77 56  BILITOT 0.7 0.7  PROT 6.1* 6.0*  ALBUMIN 2.1* 2.1*   No results for input(s): "LIPASE", "AMYLASE" in the last 168 hours. No results for input(s): "AMMONIA" in the last 168 hours. CBC: Recent Labs  Lab 01/10/23 0557 01/11/23 0601 01/13/23 0619  WBC 10.9* 15.9* 14.8*  HGB 7.7* 7.9* 6.7*  HCT 24.0* 24.4* 21.3*  MCV 91.6 93.5 96.8  PLT 259 339 313   Cardiac Enzymes: Recent Labs  Lab 01/09/23 1743 01/10/23 0557 01/13/23 0619  CKTOTAL 25* 47* 22*   BNP: BNP (last 3 results) Recent Labs    12/18/22 1531 12/27/22 1216 01/04/23 1011  BNP 448.3* 1,110.6* 1,223.7*    ProBNP (last 3 results) No results for input(s): "PROBNP" in the last 8760 hours.  CBG: Recent Labs  Lab 01/13/23 1704 01/13/23 2030 01/13/23 2345 01/14/23 0305 01/14/23 0831  GLUCAP 137* 137* 122* 111* 138*       Signed:  Ramiro Harvest MD.  [facility] 01/16/2023, 2:59 PM

## 2023-01-16 NOTE — TOC Transition Note (Signed)
Transition of Care [facility]) - CM/SW Discharge Note   Patient Details  Name: Matthew Zamora MRN: [id number] Date of Birth: 1940-07-18  Transition of Care [facility]) CM/SW Contact:  Leander Rams, LCSW Phone Number: 01/16/2023, 3:26 PM   Clinical Narrative:    Patient will DC to: [facility] [place] Anticipated DC date: 01/16/2023 Family notified: [name] Transport by: Sharin Mons   Per MD patient ready for DC to [facility] [place]. RN, patient, patient's family, and facility notified of DC. Discharge Summary and FL2 sent to facility. RN to call report prior to discharge [phone]. DC packet on chart. Ambulance transport requested for patient.   CSW will sign off for now as social work intervention is no longer needed. Please consult [place] again if new needs arise.    Final next level of care: Hospice Medical Facility Barriers to Discharge: No Barriers Identified   Patient Goals and CMS Choice   Choice offered to / list presented to : Adult Children (DIL [name])  Discharge Placement                Patient chooses bed at:  [facility] home of [place]) Patient to be transferred to facility by: [facility] Name of family member notified: [name] Patient and family notified of of transfer: 01/16/23  Discharge Plan and Services Additional resources added to the After Visit Summary for       Post Acute Care Choice: Skilled Nursing Facility                               Social Determinants of Health (SDOH) Interventions SDOH Screenings   Food Insecurity: No Food Insecurity (12/17/2022)  Housing: Patient Unable To Answer (12/17/2022)  Transportation Needs: No Transportation Needs (12/17/2022)  Utilities: Not At Risk (12/17/2022)  Financial Resource Strain: Low Risk  (12/05/2021)   [facility], [facility]  Tobacco Use: Medium Risk (01/07/2023)     Readmission Risk Interventions    12/28/2022    9:53 AM 12/18/2022    4:18 PM 04/22/2022    3:54 PM   Readmission Risk Prevention Plan  Transportation Screening Complete Complete Complete  Medication Review (RN Care Manager) Complete Complete Complete  PCP or Specialist appointment within 3-5 days of discharge Complete Complete Complete  HRI or Home Care Consult Complete Complete Complete  SW Recovery Care/Counseling Consult Complete Complete Complete  Palliative Care Screening Not Applicable Not Applicable Not Applicable  Skilled Nursing Facility Complete Not Applicable Not Applicable     Oletta Lamas, MSW, LCSWA, LCASA Transitions of Care  Clinical Social Worker I

## 2023-01-16 NOTE — Progress Notes (Addendum)
Printed AVS paper work, called facility and gave report to receiving nurse, spoke with receiving nurse about leaving new Iv in place, [facility] picked up patient. Gave AVS, DNR form and prescriptions to [facility]. Called Daughter in [place] "[place]" and let her know that patient is in route to receiving facility.

## 2023-01-16 NOTE — Plan of Care (Signed)
  Problem: Clinical Measurements: Goal: Diagnostic test results will improve Outcome: Not Progressing Goal: Signs and symptoms of infection will decrease Outcome: Not Progressing   Problem: Respiratory: Goal: Ability to maintain adequate ventilation will improve Outcome: Not Progressing   Problem: Education: Goal: Knowledge of General Education information will improve Description: Including pain rating scale, medication(s)/side effects and non-pharmacologic comfort measures Outcome: Not Progressing   Problem: Clinical Measurements: Goal: Will remain free from infection Outcome: Not Progressing

## 2023-01-16 NOTE — Progress Notes (Signed)
Patient ID: DAXX MARTINEAU, male   DOB: Oct 17, 1940, 82 y.o.   MRN: [id number]    Progress Note from the Palliative Medicine Team at [facility]   Patient Name: Matthew Zamora        Date: 01/16/2023 DOB: 12-17-1940  Age: 82 y.o. MRN#: [id number] Attending Physician: Rodolph Bong, MD Primary Care Physician: Jerl Mina, MD Admit Date: 12/29/2022   Extensive chart review has been completed prior to meeting with patient/family  including labs, vital signs, imaging, progress/consult notes, orders, medications and available advance directive documents.    Per intake H&P --> 36 year old man with myriad of comorobidities including refractory orthostatic hypotension, HFrEF, chronic N/V who presented w/ SOB at [facility] with MRSA as well has + for rhinovirus. 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   Palliative consulted to assist with clarification of GOCs in the setting of serious life limiting illness and overall failure to thrive.  This NP assessed patient at the bedside, as follow up to yesterday's ongoing conversation regarding treatment plan and patient's EOL wishes.   Son and DIL/[name]  at bedside. Patient is lethargic, with  face mask for oxygen delivery.    Patient is to weak for conversation   Continued conversation/education  with family regarding current medical situation.  Family    verbalize that focus of care is comfort and dignity, they understand prognosis is limited.   Many questions around medication being utilized for comfort.  Continue with face mask for oxygen, no escalation to BiPAP, family wishes to continue with oral medications as long as patient can tolerate them.        Patient with symptom burden 2/2 to pain, dyspnea, anxiety  and currently fever/103.2   Family requests transition to residential hospice in [place].  Referral place.    Emotional support offered.     Again preparing family  that prognosis is limited, could be days to weeks anything could happen at any time.    Education offered on natural trajectory and expectations at EOL.   Questions and concerns addressed     Discussed with bedside RN and Dr Janee Morn    Time:  55   minutes  Detailed review of medical records ( labs, imaging, vital signs), medically appropriate exam ( MS, skin, resp)   discussed with treatment team, counseling and education to patient, family, staff, documenting clinical information, medication management, coordination of care    Lorinda Creed NP  Palliative Medicine Team Team Phone # [phone] Pager [phone]

## 2023-01-16 NOTE — Progress Notes (Signed)
Called and gave report to [facility] RN

## 2023-02-16 DEATH — deceased
